# Patient Record
Sex: Female | Born: 1975
Health system: Southern US, Community
[De-identification: ages and names within clinical notes are randomized; demographics above are authoritative.]

## PROBLEM LIST (undated history)

## (undated) DIAGNOSIS — F329 Major depressive disorder, single episode, unspecified: Secondary | ICD-10-CM

## (undated) DIAGNOSIS — J4540 Moderate persistent asthma, uncomplicated: Secondary | ICD-10-CM

## (undated) DIAGNOSIS — E119 Type 2 diabetes mellitus without complications: Secondary | ICD-10-CM

## (undated) DIAGNOSIS — J449 Chronic obstructive pulmonary disease, unspecified: Secondary | ICD-10-CM

## (undated) DIAGNOSIS — R0789 Other chest pain: Secondary | ICD-10-CM

## (undated) DIAGNOSIS — N939 Abnormal uterine and vaginal bleeding, unspecified: Secondary | ICD-10-CM

## (undated) DIAGNOSIS — K2970 Gastritis, unspecified, without bleeding: Secondary | ICD-10-CM

## (undated) DIAGNOSIS — Z8639 Personal history of other endocrine, nutritional and metabolic disease: Secondary | ICD-10-CM

## (undated) DIAGNOSIS — D509 Iron deficiency anemia, unspecified: Secondary | ICD-10-CM

## (undated) DIAGNOSIS — M1990 Unspecified osteoarthritis, unspecified site: Secondary | ICD-10-CM

## (undated) DIAGNOSIS — E785 Hyperlipidemia, unspecified: Secondary | ICD-10-CM

## (undated) DIAGNOSIS — R1084 Generalized abdominal pain: Secondary | ICD-10-CM

## (undated) DIAGNOSIS — E1143 Type 2 diabetes mellitus with diabetic autonomic (poly)neuropathy: Secondary | ICD-10-CM

## (undated) DIAGNOSIS — E1142 Type 2 diabetes mellitus with diabetic polyneuropathy: Secondary | ICD-10-CM

## (undated) DIAGNOSIS — F17200 Nicotine dependence, unspecified, uncomplicated: Secondary | ICD-10-CM

## (undated) DIAGNOSIS — K219 Gastro-esophageal reflux disease without esophagitis: Secondary | ICD-10-CM

## (undated) DIAGNOSIS — N938 Other specified abnormal uterine and vaginal bleeding: Secondary | ICD-10-CM

## (undated) DIAGNOSIS — N183 Chronic kidney disease, stage 3 unspecified: Secondary | ICD-10-CM

## (undated) DIAGNOSIS — F419 Anxiety disorder, unspecified: Secondary | ICD-10-CM

## (undated) DIAGNOSIS — Z8719 Personal history of other diseases of the digestive system: Secondary | ICD-10-CM

## (undated) DIAGNOSIS — M797 Fibromyalgia: Secondary | ICD-10-CM

## (undated) DIAGNOSIS — I5032 Chronic diastolic (congestive) heart failure: Secondary | ICD-10-CM

## (undated) DIAGNOSIS — G894 Chronic pain syndrome: Secondary | ICD-10-CM

## (undated) DIAGNOSIS — F411 Generalized anxiety disorder: Secondary | ICD-10-CM

## (undated) DIAGNOSIS — G709 Myoneural disorder, unspecified: Secondary | ICD-10-CM

## (undated) DIAGNOSIS — I10 Essential (primary) hypertension: Secondary | ICD-10-CM

## (undated) DIAGNOSIS — D649 Anemia, unspecified: Secondary | ICD-10-CM

## (undated) DIAGNOSIS — R569 Unspecified convulsions: Secondary | ICD-10-CM

## (undated) DIAGNOSIS — Z973 Presence of spectacles and contact lenses: Secondary | ICD-10-CM

## (undated) DIAGNOSIS — D573 Sickle-cell trait: Secondary | ICD-10-CM

## (undated) DIAGNOSIS — K581 Irritable bowel syndrome with constipation: Secondary | ICD-10-CM

## (undated) DIAGNOSIS — J45909 Unspecified asthma, uncomplicated: Secondary | ICD-10-CM

## (undated) DIAGNOSIS — Z794 Long term (current) use of insulin: Secondary | ICD-10-CM

## (undated) DIAGNOSIS — K3184 Gastroparesis: Secondary | ICD-10-CM

## (undated) HISTORY — DX: Sickle-cell trait: D57.3

## (undated) HISTORY — PX: TUBAL LIGATION: SHX77

## (undated) HISTORY — DX: Gastritis, unspecified, without bleeding: K29.70

## (undated) HISTORY — PX: EYE SURGERY: SHX253

## (undated) HISTORY — PX: CATARACT EXTRACTION W/ INTRAOCULAR LENS IMPLANT: SHX1309

## (undated) HISTORY — DX: Type 2 diabetes mellitus without complications: Z79.4

---

## 2006-11-06 DIAGNOSIS — R232 Flushing: Secondary | ICD-10-CM | POA: Insufficient documentation

## 2006-11-06 DIAGNOSIS — E1143 Type 2 diabetes mellitus with diabetic autonomic (poly)neuropathy: Secondary | ICD-10-CM | POA: Insufficient documentation

## 2012-05-13 DIAGNOSIS — G8929 Other chronic pain: Secondary | ICD-10-CM | POA: Insufficient documentation

## 2012-05-13 DIAGNOSIS — G894 Chronic pain syndrome: Secondary | ICD-10-CM | POA: Insufficient documentation

## 2013-05-04 DIAGNOSIS — M1710 Unilateral primary osteoarthritis, unspecified knee: Secondary | ICD-10-CM | POA: Insufficient documentation

## 2013-05-04 DIAGNOSIS — R609 Edema, unspecified: Secondary | ICD-10-CM | POA: Insufficient documentation

## 2013-05-04 DIAGNOSIS — G5600 Carpal tunnel syndrome, unspecified upper limb: Secondary | ICD-10-CM | POA: Insufficient documentation

## 2015-03-15 ENCOUNTER — Encounter (HOSPITAL_COMMUNITY): Payer: Self-pay | Admitting: Nurse Practitioner

## 2015-03-15 ENCOUNTER — Emergency Department (HOSPITAL_COMMUNITY)
Admission: EM | Admit: 2015-03-15 | Discharge: 2015-03-15 | Disposition: A | Discharge status: Home / Self Care | Payer: Medicaid - Out of State | Attending: Emergency Medicine | Admitting: Emergency Medicine

## 2015-03-15 DIAGNOSIS — E119 Type 2 diabetes mellitus without complications: Secondary | ICD-10-CM | POA: Insufficient documentation

## 2015-03-15 DIAGNOSIS — I10 Essential (primary) hypertension: Secondary | ICD-10-CM | POA: Diagnosis not present

## 2015-03-15 DIAGNOSIS — Z760 Encounter for issue of repeat prescription: Secondary | ICD-10-CM | POA: Diagnosis present

## 2015-03-15 DIAGNOSIS — Z8739 Personal history of other diseases of the musculoskeletal system and connective tissue: Secondary | ICD-10-CM | POA: Diagnosis not present

## 2015-03-15 DIAGNOSIS — Z720 Tobacco use: Secondary | ICD-10-CM | POA: Insufficient documentation

## 2015-03-15 HISTORY — DX: Type 2 diabetes mellitus without complications: E11.9

## 2015-03-15 HISTORY — DX: Essential (primary) hypertension: I10

## 2015-03-15 HISTORY — DX: Unspecified osteoarthritis, unspecified site: M19.90

## 2015-03-15 MED ORDER — INSULIN GLARGINE 100 UNIT/ML ~~LOC~~ SOLN: 70.0000 [IU] | Freq: Two times a day (BID) | SUBCUTANEOUS | Status: DC

## 2015-03-15 MED ORDER — LISINOPRIL 20 MG PO TABS: 20.0000 mg | ORAL_TABLET | Freq: Every day | ORAL | Status: DC

## 2015-03-15 NOTE — ED Provider Notes (Signed)
CSN: [id number]     Arrival date & time 03/15/15  1235 History  This chart was scribed for non-physician practitioner, Trixie Dredge, PA-C, working with Mancel Bale, MD by Charline Bills, ED Scribe. This patient was seen in room TR10C/TR10C and the patient's care was started at 1:31 PM.   Chief Complaint  Patient presents with  . Medication Refill   The history is provided by the patient. No language interpreter was used.   HPI Comments: Jill Shaw is a 39 y.o. female, with a h/o DM, HTN, arthritis, who presents to the Emergency Department for a medication refill of Lantus, Lisinopril and Hydrocodone. Pt moved here 2 months ago from [place] and has not found a PCP that accepts her insurance yet. She ran out of Lantus and Lisinopril yesterday and Hydrocodone that she takes for arthritis 6-7 days ago. Pt's most recent blood glucose was 160. She reports intermittent bilateral foot pain that she attributes to arthritis. Pain is exacerbated in rainy weather, with walking and standing for long periods of time. Pt denies nausea, vomiting, diarrhea, chills, body aches, polydipsia, urinary frequency, dizziness, light-headedness.   Past Medical History  Diagnosis Date  . Arthritis   . Diabetes mellitus without complication   . Hypertension    No past surgical history on file. No family history on file. History  Substance Use Topics  . Smoking status: Current Every Day Smoker    Types: Cigarettes  . Smokeless tobacco: Not on file  . Alcohol Use: No   OB History    No data available     Review of Systems  Constitutional: Negative for fever and chills.  Gastrointestinal: Negative for nausea, vomiting and diarrhea.  Endocrine: Negative for polydipsia.  Genitourinary: Negative for frequency.  Musculoskeletal: Positive for arthralgias. Negative for myalgias.  Allergic/Immunologic: Positive for immunocompromised state (diabetes).  Neurological: Negative for dizziness and light-headedness.   Hematological: Does not bruise/bleed easily.   Allergies  Ibuprofen  Home Medications   Prior to Admission medications   Not on File   Triage: BP 146/78 mmHg  Pulse 94  Temp(Src) 98.2 F (36.8 C) (Oral)  Resp 15  SpO2 97% Physical Exam  Constitutional: She appears well-developed and well-nourished. No distress.  HENT:  Head: Normocephalic and atraumatic.  Eyes: Conjunctivae are normal.  Neck: Neck supple.  Pulmonary/Chest: Effort normal.  Neurological: She is alert. She exhibits normal muscle tone.  Skin: She is not diaphoretic.  Thickened nail of right great toe.    Psychiatric: She has a normal mood and affect. Her behavior is normal.  Nursing note and vitals reviewed.  ED Course  Procedures (including critical care time) DIAGNOSTIC STUDIES: Oxygen Saturation is 97% on RA, normal by my interpretation.    COORDINATION OF CARE: 1:36 PM-Discussed treatment plan which includes Lantus and Lisinopril with pt at bedside and pt agreed to plan.   Labs Review Labs Reviewed - No data to display  Imaging Review No results found.   EKG Interpretation None      MDM   Final diagnoses:  Medication refill    Afebrile, nontoxic patient with request for med refill with no change in chronic conditions and no new complaints.  She ran out of lisinopril, Lantus yesterday and has been out of her vicodin for 1 week.  No withdrawal symptoms.  No symptoms concerning for significant hyperglycemia.  Blood sugars have been well controlled at home.   D/C home with PCP resources, prescriptions for Lantus and lisinopril only.  Pt advised  she needed primary care to discussed chronic pain management.  Discussed result, findings, treatment, and follow up  with patient.  Pt given return precautions.  Pt verbalizes understanding and agrees with plan.       I personally performed the services described in this documentation, which was scribed in my presence. The recorded information has been  reviewed and is accurate.    Trixie Dredgemily Natalia Wittmeyer, PA-C 03/15/15 1400  Mancel BaleElliott Wentz, MD 03/15/15 [phone]

## 2015-03-15 NOTE — Discharge Instructions (Signed)
Read the information below.  Use the prescribed medication as directed.  Please discuss all new medications with your pharmacist.  You may return to the Emergency Department at any time for worsening condition or any new symptoms that concern you.  If there is any possibility that you might be pregnant, please let your health care provider know and discuss this with the pharmacist to ensure medication safety.  If you develop fevers, uncontrolled pain, uncontrolled vomiting, or difficulty breathing or swallowing return to the ER for a recheck.      Medication Refill, Emergency Department We have refilled your medication today as a courtesy to you. It is best for your medical care, however, to take care of getting refills done through your primary caregiver's office. They have your records and can do a better job of follow-up than we can in the emergency department. On maintenance medications, we often only prescribe enough medications to get you by until you are able to see your regular caregiver. This is a more expensive way to refill medications. In the future, please plan for refills so that you will not have to use the emergency department for this. Thank you for your help. Your help allows us to better take care of the daily emergencies that enter our department. Document Released: 01/19/2004 Document Revised: 12/25/2011 Document Reviewed: 01/09/2014 [facility] Patient Information 2015 [place], [place]. This information is not intended to replace advice given to you by your health care provider. Make sure you discuss any questions you have with your health care provider.

## 2015-03-15 NOTE — ED Notes (Signed)
She reports she has run out of her medications and needs a refill. She states now she is having bilateral foot pain from her arthritis because she doesn't have her pain meds

## 2015-07-08 ENCOUNTER — Ambulatory Visit: Payer: Medicaid - Out of State

## 2015-07-12 ENCOUNTER — Encounter: Payer: Self-pay | Admitting: Internal Medicine

## 2015-07-12 ENCOUNTER — Ambulatory Visit: Payer: Medicaid - Out of State | Admitting: Internal Medicine

## 2016-01-15 DIAGNOSIS — Z87898 Personal history of other specified conditions: Secondary | ICD-10-CM

## 2016-01-15 HISTORY — DX: Personal history of other specified conditions: Z87.898

## 2016-01-19 ENCOUNTER — Ambulatory Visit: Payer: Medicaid - Out of State | Attending: Internal Medicine

## 2016-01-21 ENCOUNTER — Encounter (HOSPITAL_COMMUNITY): Payer: Self-pay | Admitting: Emergency Medicine

## 2016-01-21 ENCOUNTER — Emergency Department (HOSPITAL_COMMUNITY): Payer: Self-pay

## 2016-01-21 ENCOUNTER — Emergency Department (HOSPITAL_COMMUNITY)
Admission: EM | Admit: 2016-01-21 | Discharge: 2016-01-21 | Disposition: A | Discharge status: Home / Self Care | Payer: Self-pay | Attending: Emergency Medicine | Admitting: Emergency Medicine

## 2016-01-21 DIAGNOSIS — E876 Hypokalemia: Secondary | ICD-10-CM | POA: Insufficient documentation

## 2016-01-21 DIAGNOSIS — F17210 Nicotine dependence, cigarettes, uncomplicated: Secondary | ICD-10-CM | POA: Insufficient documentation

## 2016-01-21 DIAGNOSIS — E11649 Type 2 diabetes mellitus with hypoglycemia without coma: Secondary | ICD-10-CM | POA: Insufficient documentation

## 2016-01-21 DIAGNOSIS — E162 Hypoglycemia, unspecified: Secondary | ICD-10-CM

## 2016-01-21 DIAGNOSIS — Z3202 Encounter for pregnancy test, result negative: Secondary | ICD-10-CM | POA: Insufficient documentation

## 2016-01-21 DIAGNOSIS — Z8739 Personal history of other diseases of the musculoskeletal system and connective tissue: Secondary | ICD-10-CM | POA: Insufficient documentation

## 2016-01-21 DIAGNOSIS — R569 Unspecified convulsions: Secondary | ICD-10-CM | POA: Insufficient documentation

## 2016-01-21 DIAGNOSIS — Z79899 Other long term (current) drug therapy: Secondary | ICD-10-CM | POA: Insufficient documentation

## 2016-01-21 DIAGNOSIS — Z794 Long term (current) use of insulin: Secondary | ICD-10-CM | POA: Insufficient documentation

## 2016-01-21 DIAGNOSIS — I10 Essential (primary) hypertension: Secondary | ICD-10-CM | POA: Insufficient documentation

## 2016-01-21 DIAGNOSIS — E669 Obesity, unspecified: Secondary | ICD-10-CM | POA: Insufficient documentation

## 2016-01-21 LAB — CBG MONITORING, ED
GLUCOSE-CAPILLARY: 50 mg/dL — AB (ref 65–99)
GLUCOSE-CAPILLARY: 96 mg/dL (ref 65–99)
GLUCOSE-CAPILLARY: 157 mg/dL — AB (ref 65–99)
Glucose-Capillary: 73 mg/dL (ref 65–99)

## 2016-01-21 LAB — RAPID URINE DRUG SCREEN, HOSP PERFORMED
Amphetamines: NOT DETECTED
BENZODIAZEPINES: NOT DETECTED
Barbiturates: NOT DETECTED
Cocaine: NOT DETECTED
Opiates: NOT DETECTED
Tetrahydrocannabinol: NOT DETECTED

## 2016-01-21 LAB — COMPREHENSIVE METABOLIC PANEL WITH GFR
ALT: 28 U/L (ref 14–54)
AST: 46 U/L — ABNORMAL HIGH (ref 15–41)
Albumin: 3.4 g/dL — ABNORMAL LOW (ref 3.5–5.0)
Alkaline Phosphatase: 59 U/L (ref 38–126)
Anion gap: 16 — ABNORMAL HIGH (ref 5–15)
BUN: 10 mg/dL (ref 6–20)
CO2: 19 mmol/L — ABNORMAL LOW (ref 22–32)
Calcium: 8.7 mg/dL — ABNORMAL LOW (ref 8.9–10.3)
Chloride: 106 mmol/L (ref 101–111)
Creatinine, Ser: 1.15 mg/dL — ABNORMAL HIGH (ref 0.44–1.00)
GFR calc Af Amer: >60 mL/min
GFR calc non Af Amer: 59 mL/min — ABNORMAL LOW
Glucose, Bld: 53 mg/dL — ABNORMAL LOW (ref 65–99)
Potassium: 3.3 mmol/L — ABNORMAL LOW (ref 3.5–5.1)
Sodium: 141 mmol/L (ref 135–145)
Total Bilirubin: 0.3 mg/dL (ref 0.3–1.2)
Total Protein: 6.1 g/dL — ABNORMAL LOW (ref 6.5–8.1)

## 2016-01-21 LAB — CBC
HCT: 30.8 % — ABNORMAL LOW (ref 36.0–46.0)
Hemoglobin: 9.5 g/dL — ABNORMAL LOW (ref 12.0–15.0)
MCH: 21.5 pg — ABNORMAL LOW (ref 26.0–34.0)
MCHC: 30.8 g/dL (ref 30.0–36.0)
MCV: 69.7 fL — ABNORMAL LOW (ref 78.0–100.0)
Platelets: 222 K/uL (ref 150–400)
RBC: 4.42 MIL/uL (ref 3.87–5.11)
RDW: 20.4 % — ABNORMAL HIGH (ref 11.5–15.5)
WBC: 8.5 K/uL (ref 4.0–10.5)

## 2016-01-21 LAB — URINALYSIS, ROUTINE W REFLEX MICROSCOPIC
Bilirubin Urine: NEGATIVE
GLUCOSE, UA: 100 mg/dL — AB
Ketones, ur: NEGATIVE mg/dL
Nitrite: NEGATIVE
PROTEIN: NEGATIVE mg/dL
Specific Gravity, Urine: 1.008 (ref 1.005–1.030)
pH: 6 (ref 5.0–8.0)

## 2016-01-21 LAB — URINE MICROSCOPIC-ADD ON: Bacteria, UA: NONE SEEN

## 2016-01-21 LAB — POC URINE PREG, ED: Preg Test, Ur: NEGATIVE

## 2016-01-21 MED ORDER — ACETAMINOPHEN 500 MG PO TABS
1000.0000 mg | ORAL_TABLET | Freq: Once | ORAL | Status: AC
  Administered 2016-01-21: 1000 mg via ORAL
  Filled 2016-01-21: qty 2

## 2016-01-21 MED ORDER — LEVETIRACETAM 500 MG PO TABS: 500.0000 mg | ORAL_TABLET | Freq: Two times a day (BID) | ORAL | Status: DC

## 2016-01-21 MED ORDER — IBUPROFEN 800 MG PO TABS: 800.0000 mg | ORAL_TABLET | Freq: Once | ORAL | Status: DC

## 2016-01-21 MED ORDER — DEXTROSE 50 % IV SOLN
INTRAVENOUS | Status: AC
  Administered 2016-01-21: 03:00:00
  Filled 2016-01-21: qty 50

## 2016-01-21 MED ORDER — SODIUM CHLORIDE 0.9 % IV SOLN
500.0000 mg | Freq: Once | INTRAVENOUS | Status: AC
  Administered 2016-01-21: 500 mg via INTRAVENOUS
  Filled 2016-01-21: qty 5

## 2016-01-21 MED ORDER — ONDANSETRON 4 MG PO TBDP: 4.0000 mg | ORAL_TABLET | Freq: Once | ORAL | Status: DC

## 2016-01-21 MED ORDER — SODIUM CHLORIDE 0.9 % IV BOLUS (SEPSIS)
1000.0000 mL | Freq: Once | INTRAVENOUS | Status: AC
  Administered 2016-01-21: 1000 mL via INTRAVENOUS

## 2016-01-21 NOTE — ED Provider Notes (Signed)
By signing my name below, I, Jill Shaw, attest that this documentation has been prepared under the direction and in the presence of [employer], DO. Electronically Signed: Budd Shaw, ED Scribe. 01/21/2016. 3:12 AM.  TIME SEEN: 3:00 AM  CHIEF COMPLAINT: Seizure  HPI: Jill Shaw is a 40 y.o. female smoker with a PMHx of DM, HTN, and seizures brought in by ambulance who presents to the Emergency Department complaining of seizure that occurred prior to arrival.  Per mom, pt woke up yelling in bed. She states pt was having seizure-like activity with full-body shakes, but notes this did not last very long. She reports pt being confused immediately afterwards, with heavy breathing and sticking out her tongue. Pt notes a PMHx of seizures with hypoglycemia in the past. She notes she is on insulin. Per mom, pt has not been seen by a neurologist for the seizures. She notes that pt has been working night shifts and that her seizures have been occuring for the past few years, typically when pt was stressed and has not slept enough. She states that pt's blood sugar was 91 at home, which is "low for her" per mother.  Mother does report that she will have seizures sometimes in the setting of a normal glucose. Pt denies any other pains, fever, n/v/d.   ROS: See HPI Constitutional: no fever  Eyes: no drainage  ENT: no runny nose   Cardiovascular:  no chest pain  Resp: no SOB  GI: no vomiting GU: no dysuria Integumentary: no rash  Allergy: no hives  Musculoskeletal: no leg swelling  Neurological: no slurred speech ROS otherwise negative  PAST MEDICAL HISTORY/PAST SURGICAL HISTORY:  Past Medical History  Diagnosis Date  . Arthritis   . Diabetes mellitus without complication (HCC)   . Hypertension     MEDICATIONS:  Prior to Admission medications   Medication Sig Start Date End Date Taking? Authorizing Provider  insulin glargine (LANTUS) 100 UNIT/ML injection Inject 0.7 mLs (70 Units  total) into the skin 2 (two) times daily. As previously directed 03/15/15   Trixie Dredge, PA-C  lisinopril (PRINIVIL,ZESTRIL) 20 MG tablet Take 1 tablet (20 mg total) by mouth daily. 03/15/15   Trixie Dredge, PA-C    ALLERGIES:  Allergies  Allergen Reactions  . Ibuprofen     SOCIAL HISTORY:  Social History  Substance Use Topics  . Smoking status: Current Every Day Smoker -- 0.50 packs/day    Types: Cigarettes  . Smokeless tobacco: Not on file  . Alcohol Use: No    FAMILY HISTORY: History reviewed. No pertinent family history.  EXAM: BP 131/79 mmHg  Pulse 100  Temp(Src) 99.1 F (37.3 C) (Oral)  Resp 20  SpO2 98%  LMP 01/06/2016 CONSTITUTIONAL: Alert and oriented x 3 and responds appropriately to questions. Well-appearing; well-nourished; obese HEAD: Normocephalic EYES: Conjunctivae clear, PERRL ENT: normal nose; no rhinorrhea; moist mucous membranes NECK: Supple, no meningismus, no LAD  CARD: RRR; S1 and S2 appreciated; no murmurs, no clicks, no rubs, no gallops RESP: Normal chest excursion without splinting or tachypnea; breath sounds clear and equal bilaterally; no wheezes, no rhonchi, no rales, no hypoxia or respiratory distress, speaking full sentences ABD/GI: Normal bowel sounds; non-distended; soft, non-tender, no rebound, no guarding, no peritoneal signs BACK:  The back appears normal and is non-tender to palpation, there is no CVA tenderness EXT: Normal ROM in all joints; non-tender to palpation; no edema; normal capillary refill; no cyanosis, no calf tenderness or swelling    SKIN:  Normal color for age and race; warm; no rash NEURO: Moves all extremities equally, sensation to light touch intact diffusely, cranial nerves II through XII intact PSYCH: The patient's mood and manner are appropriate. Grooming and personal hygiene are appropriate.  MEDICAL DECISION MAKING: Patient here with seizures. Family reports she does have seizures in the setting of hypoglycemia but also  without hypoglycemia. Glucose was EMS was 91 but is now 50. We have given her D50. No injury from the seizure. Reports mild headache which is typical for her. Neurologically intact currently. No longer postictal. We'll obtain labs, urine. She does report intermittent cough. Will obtain a chest x-ray. We'll discuss with neurology on-call. Have advised her and her mother that she should not be driving until she has been cleared by a neurologist.  ED PROGRESS: Patient's labs unremarkable other than mildly low potassium of 3.3, bicarbonate 19 and gap of 16. Suspect that she has an elevated lactate from seizure which may be causing her mild elevated anion gap metabolic acidosis. We'll give IV fluids. She is drinking fluids. Urine shows leukocytes but also blood. She is on her menstrual cycle and not having urinary symptoms. Denies excessive negative. I have discussed patient's care with Dr. Cherylynn RidgesShikhman with neurology. He agrees patient needs to be on antiepileptic and follow-up as outpatient for an MRI of her brain, EEG. Will start on Keppra 500 mg twice a day. Given first dose IV in the emergency department. We'll discharge with prescription for the same and coupon to obtain this prescription from all her pharmacy. She reports that she is supposed to obtain an orange card next week. Have provided her with neurology and PCP follow-up. Have advised her to watch her blood glucose closely at home. She is only on Humalog. Recommended close outpatient follow-up with her PCP to adjust her insulin regimen. She is no longer on her Lantus because she cannot afford it at this time.   At this time, I do not feel there is any life-threatening condition present. I have reviewed and discussed all results (EKG, imaging, lab, urine as appropriate), exam findings with patient. I have reviewed nursing notes and appropriate previous records.  I feel the patient is safe to be discharged home without further emergent workup. Discussed  usual and customary return precautions. Patient and family (if present) verbalize understanding and are comfortable with this plan.  Patient will follow-up with their primary care provider. If they do not have a primary care provider, information for follow-up has been provided to them. All questions have been answered.   I personally performed the services described in this documentation, which was scribed in my presence. The recorded information has been reviewed and is accurate.   Jill MawKristen N Annell Canty, DO 01/21/16 0830

## 2016-01-21 NOTE — Discharge Instructions (Signed)
You should not drive a car, operate machinery or perform any other activity that need be dangerous to you've your to have another seizure until you have been cleared by a neurologist. Please call neurology to schedule an outpatient appointment and she will need an MRI of your brain and an EEG to measure electrical activity of your brain. Please take Keppra twice a day as prescribed. Please take your insulin as prescribed. Please follow up with her primary care physician.  Hypoglycemia Hypoglycemia occurs when the glucose in your blood is too low. Glucose is a type of sugar that is your body's main energy source. Hormones, such as insulin and glucagon, control the level of glucose in the blood. Insulin lowers blood glucose and glucagon increases blood glucose. Having too much insulin in your blood stream, or not eating enough food containing sugar, can result in hypoglycemia. Hypoglycemia can happen to people with or without diabetes. It can develop quickly and can be a medical emergency.  CAUSES   Missing or delaying meals.  Not eating enough carbohydrates at meals.  Taking too much diabetes medicine.  Not timing your oral diabetes medicine or insulin doses with meals, snacks, and exercise.  Nausea and vomiting.  Certain medicines.  Severe illnesses, such as hepatitis, kidney disorders, and certain eating disorders.  Increased activity or exercise without eating something extra or adjusting medicines.  Drinking too much alcohol.  A nerve disorder that affects body functions like your heart rate, blood pressure, and digestion (autonomic neuropathy).  A condition where the stomach muscles do not function properly (gastroparesis). Therefore, medicines and food may not absorb properly.  Rarely, a tumor of the pancreas can produce too much insulin. SYMPTOMS   Hunger.  Sweating (diaphoresis).  Change in body  temperature.  Shakiness.  Headache.  Anxiety.  Lightheadedness.  Irritability.  Difficulty concentrating.  Dry mouth.  Tingling or numbness in the hands or feet.  Restless sleep or sleep disturbances.  Altered speech and coordination.  Change in mental status.  Seizures or prolonged convulsions.  Combativeness.  Drowsiness (lethargic).  Weakness.  Increased heart rate or palpitations.  Confusion.  Pale, gray skin color.  Blurred or double vision.  Fainting. DIAGNOSIS  A physical exam and medical history will be performed. Your caregiver may make a diagnosis based on your symptoms. Blood tests and other lab tests may be performed to confirm a diagnosis. Once the diagnosis is made, your caregiver will see if your signs and symptoms go away once your blood glucose is raised.  TREATMENT  Usually, you can easily treat your hypoglycemia when you notice symptoms.  Check your blood glucose. If it is less than 70 mg/dl, take one of the following:   3-4 glucose tablets.    cup juice.    cup regular soda.   1 cup skim milk.   -1 tube of glucose gel.   5-6 hard candies.   Avoid high-fat drinks or food that may delay a rise in blood glucose levels.  Do not take more than the recommended amount of sugary foods, drinks, gel, or tablets. Doing so will cause your blood glucose to go too high.   Wait 10-15 minutes and recheck your blood glucose. If it is still less than 70 mg/dl or below your target range, repeat treatment.   Eat a snack if it is more than 1 hour until your next meal.  There may be a time when your blood glucose may go so low that you are unable to  treat yourself at home when you start to notice symptoms. You may need someone to help you. You may even faint or be unable to swallow. If you cannot treat yourself, someone will need to bring you to the hospital.  HOME CARE INSTRUCTIONS  If you have diabetes, follow your diabetes management  plan by:  Taking your medicines as directed.  Following your exercise plan.  Following your meal plan. Do not skip meals. Eat on time.  Testing your blood glucose regularly. Check your blood glucose before and after exercise. If you exercise longer or different than usual, be sure to check blood glucose more frequently.  Wearing your medical alert jewelry that says you have diabetes.  Identify the cause of your hypoglycemia. Then, develop ways to prevent the recurrence of hypoglycemia.  Do not take a hot bath or shower right after an insulin shot.  Always carry treatment with you. Glucose tablets are the easiest to carry.  If you are going to drink alcohol, drink it only with meals.  Tell friends or family members ways to keep you safe during a seizure. This may include removing hard or sharp objects from the area or turning you on your side.  Maintain a healthy weight. SEEK MEDICAL CARE IF:   You are having problems keeping your blood glucose in your target range.  You are having frequent episodes of hypoglycemia.  You feel you might be having side effects from your medicines.  You are not sure why your blood glucose is dropping so low.  You notice a change in vision or a new problem with your vision. SEEK IMMEDIATE MEDICAL CARE IF:   Confusion develops.  A change in mental status occurs.  The inability to swallow develops.  Fainting occurs.   This information is not intended to replace advice given to you by your health care provider. Make sure you discuss any questions you have with your health care provider.   Document Released: 10/02/2005 Document Revised: 10/07/2013 Document Reviewed: 06/08/2015 Elsevier Interactive Patient Education [employer]2016 [employer].  Seizure, Adult A seizure is abnormal electrical activity in the brain. Seizures usually last from 30 seconds to 2 minutes. There are various types of seizures. Before a seizure, you may have a warning  sensation (aura) that a seizure is about to occur. An aura may include the following symptoms:   Fear or anxiety.  Nausea.  Feeling like the room is spinning (vertigo).  Vision changes, such as seeing flashing lights or spots. Common symptoms during a seizure include:  A change in attention or behavior (altered mental status).  Convulsions with rhythmic jerking movements.  Drooling.  Rapid eye movements.  Grunting.  Loss of bladder and bowel control.  Bitter taste in the mouth.  Tongue biting. After a seizure, you may feel confused and sleepy. You may also have an injury resulting from convulsions during the seizure. HOME CARE INSTRUCTIONS   If you are given medicines, take them exactly as prescribed by your health care provider.  Keep all follow-up appointments as directed by your health care provider.  Do not swim or drive or engage in risky activity during which a seizure could cause further injury to you or others until your health care provider says it is OK.  Get adequate rest.  Teach friends and family what to do if you have a seizure. They should:  Lay you on the ground to prevent a fall.  Put a cushion under your head.  Loosen any tight clothing  around your neck.  Turn you on your side. If vomiting occurs, this helps keep your airway clear.  Stay with you until you recover.  Know whether or not you need emergency care. SEEK IMMEDIATE MEDICAL CARE IF:  The seizure lasts longer than 5 minutes.  The seizure is severe or you do not wake up immediately after the seizure.  You have an altered mental status after the seizure.  You are having more frequent or worsening seizures. Someone should drive you to the emergency department or call local emergency services (911 in [place] MAKE SURE YOU:  Understand these instructions.  Will watch your condition.  Will get help right away if you are not doing well or get worse.   This information is not  intended to replace advice given to you by your health care provider. Make sure you discuss any questions you have with your health care provider.   Document Released: 09/29/2000 Document Revised: 10/23/2014 Document Reviewed: 05/14/2013 Elsevier Interactive Patient Education [employer]   To find a primary care or specialty doctor please call [phone] or [phone] to access "[facility] Find a Doctor Service."  You may also go on the [facility] website at [email or web address]  There are also multiple [facility] practices throughout the [place] that are frequently accepting new patients. You may find a clinic that is close to your home and contact them.  [facility] -  [address] [place] [address] [phone]  [facility] in [place] (also locations in [place] and [place]) -  [address] [place] [address] [phone]  [facility] -  [address] [place] [address] [phone]

## 2016-01-21 NOTE — ED Notes (Signed)
Per EMS:  PT's family called 911 after patient woke up from sleep and was "yelling".  Pt was altered when family arrived to room, patient able to state she needed to pee and go to bathroom independently.  Pt lethargic during transport.  PAtient more alert once arriving to room.  Pt's family states pt has seizures when she is hypoglycemic, EMS states CBG= 91.  Patient respirations deep and intentional.

## 2016-01-21 NOTE — ED Notes (Signed)
Pt requesting to sit on the edge of the bed. This RN explained that it would be better to be fully in the bed due to being a high fall risk. Pt insistent on sitting on the edge of the bed, family at the bedside offering to sit in front of the patient. Pt sitting on the edge of the bed at this time. Denies feeling lightheaded or dizzy.

## 2016-01-21 NOTE — ED Notes (Signed)
MD at bedside. 

## 2016-01-21 NOTE — ED Notes (Signed)
CBG 50, Dr Elesa MassedWard notified and gave verbal order for amp of d50

## 2016-01-22 LAB — URINE CULTURE

## 2016-02-02 ENCOUNTER — Ambulatory Visit: Payer: Medicaid - Out of State | Admitting: Family Medicine

## 2016-02-08 ENCOUNTER — Emergency Department (HOSPITAL_COMMUNITY)
Admission: EM | Admit: 2016-02-08 | Discharge: 2016-02-08 | Disposition: A | Discharge status: Home / Self Care | Payer: Medicaid - Out of State | Attending: Emergency Medicine | Admitting: Emergency Medicine

## 2016-02-08 ENCOUNTER — Encounter (HOSPITAL_COMMUNITY): Payer: Self-pay | Admitting: Emergency Medicine

## 2016-02-08 DIAGNOSIS — Z8739 Personal history of other diseases of the musculoskeletal system and connective tissue: Secondary | ICD-10-CM | POA: Insufficient documentation

## 2016-02-08 DIAGNOSIS — Z862 Personal history of diseases of the blood and blood-forming organs and certain disorders involving the immune mechanism: Secondary | ICD-10-CM | POA: Insufficient documentation

## 2016-02-08 DIAGNOSIS — Z79899 Other long term (current) drug therapy: Secondary | ICD-10-CM | POA: Insufficient documentation

## 2016-02-08 DIAGNOSIS — Z3202 Encounter for pregnancy test, result negative: Secondary | ICD-10-CM | POA: Insufficient documentation

## 2016-02-08 DIAGNOSIS — R739 Hyperglycemia, unspecified: Secondary | ICD-10-CM

## 2016-02-08 DIAGNOSIS — F17210 Nicotine dependence, cigarettes, uncomplicated: Secondary | ICD-10-CM | POA: Insufficient documentation

## 2016-02-08 DIAGNOSIS — I10 Essential (primary) hypertension: Secondary | ICD-10-CM | POA: Insufficient documentation

## 2016-02-08 DIAGNOSIS — Z794 Long term (current) use of insulin: Secondary | ICD-10-CM | POA: Insufficient documentation

## 2016-02-08 DIAGNOSIS — E1165 Type 2 diabetes mellitus with hyperglycemia: Secondary | ICD-10-CM | POA: Insufficient documentation

## 2016-02-08 LAB — CBC WITH DIFFERENTIAL/PLATELET
BASOS ABS: 0 10*3/uL (ref 0.0–0.1)
BLASTS: 0 %
Band Neutrophils: 0 %
Basophils Relative: 0 %
EOS ABS: 0.1 10*3/uL (ref 0.0–0.7)
Eosinophils Relative: 1 %
HEMATOCRIT: 32 % — AB (ref 36.0–46.0)
HEMOGLOBIN: 9.7 g/dL — AB (ref 12.0–15.0)
LYMPHS PCT: 21 %
Lymphs Abs: 2.1 10*3/uL (ref 0.7–4.0)
MCH: 21 pg — ABNORMAL LOW (ref 26.0–34.0)
MCHC: 30.3 g/dL (ref 30.0–36.0)
MCV: 69.4 fL — AB (ref 78.0–100.0)
METAMYELOCYTES PCT: 0 %
MONOS PCT: 5 %
Monocytes Absolute: 0.5 10*3/uL (ref 0.1–1.0)
Myelocytes: 0 %
NEUTROS ABS: 7.1 10*3/uL (ref 1.7–7.7)
Neutrophils Relative %: 73 %
Other: 0 %
PROMYELOCYTES ABS: 0 %
Platelets: 182 10*3/uL (ref 150–400)
RBC: 4.61 MIL/uL (ref 3.87–5.11)
RDW: 20.2 % — AB (ref 11.5–15.5)
WBC: 9.8 10*3/uL (ref 4.0–10.5)
nRBC: 0 /100 WBC

## 2016-02-08 LAB — I-STAT VENOUS BLOOD GAS, ED
Bicarbonate: 23.7 mEq/L (ref 20.0–24.0)
O2 Saturation: 80 %
TCO2: 25 mmol/L (ref 0–100)
pCO2, Ven: 36.3 mmHg — ABNORMAL LOW (ref 45.0–50.0)
pH, Ven: 7.423 — ABNORMAL HIGH (ref 7.250–7.300)
pO2, Ven: 43 mmHg (ref 31.0–45.0)

## 2016-02-08 LAB — CBG MONITORING, ED
GLUCOSE-CAPILLARY: 302 mg/dL — AB (ref 65–99)
Glucose-Capillary: 505 mg/dL — ABNORMAL HIGH (ref 65–99)
Glucose-Capillary: 394 mg/dL — ABNORMAL HIGH (ref 65–99)
Glucose-Capillary: 380 mg/dL — ABNORMAL HIGH (ref 65–99)

## 2016-02-08 LAB — POC URINE PREG, ED: PREG TEST UR: NEGATIVE

## 2016-02-08 LAB — URINALYSIS, ROUTINE W REFLEX MICROSCOPIC
BILIRUBIN URINE: NEGATIVE
Glucose, UA: >1000 mg/dL — AB
HGB URINE DIPSTICK: NEGATIVE
KETONES UR: 15 mg/dL — AB
LEUKOCYTES UA: NEGATIVE
NITRITE: NEGATIVE
PROTEIN: NEGATIVE mg/dL
Specific Gravity, Urine: 1.029 (ref 1.005–1.030)
pH: 6 (ref 5.0–8.0)

## 2016-02-08 LAB — URINE MICROSCOPIC-ADD ON: RBC / HPF: NONE SEEN RBC/hpf (ref 0–5)

## 2016-02-08 LAB — BASIC METABOLIC PANEL
Anion gap: 10 (ref 5–15)
BUN: 12 mg/dL (ref 6–20)
CALCIUM: 9 mg/dL (ref 8.9–10.3)
CO2: 22 mmol/L (ref 22–32)
Chloride: 102 mmol/L (ref 101–111)
Creatinine, Ser: 1.18 mg/dL — ABNORMAL HIGH (ref 0.44–1.00)
GFR calc Af Amer: >60 mL/min (ref >60)
GFR, EST NON AFRICAN AMERICAN: 57 mL/min — AB (ref >60)
GLUCOSE: 550 mg/dL — AB (ref 65–99)
Potassium: 3.9 mmol/L (ref 3.5–5.1)
SODIUM: 134 mmol/L — AB (ref 135–145)

## 2016-02-08 MED ORDER — INSULIN LISPRO PROT & LISPRO (75-25 MIX) 100 UNIT/ML KWIKPEN: 50.0000 [IU] | PEN_INJECTOR | Freq: Two times a day (BID) | SUBCUTANEOUS | Status: DC

## 2016-02-08 MED ORDER — SODIUM CHLORIDE 0.9 % IV BOLUS (SEPSIS)
1000.0000 mL | Freq: Once | INTRAVENOUS | Status: AC
  Administered 2016-02-08: 1000 mL via INTRAVENOUS

## 2016-02-08 MED ORDER — INSULIN ASPART PROT & ASPART (70-30 MIX) 100 UNIT/ML ~~LOC~~ SUSP
70.0000 [IU] | Freq: Once | SUBCUTANEOUS | Status: AC
  Administered 2016-02-08: 70 [IU] via SUBCUTANEOUS
  Filled 2016-02-08: qty 10

## 2016-02-08 MED ORDER — SODIUM CHLORIDE 0.9 % IV SOLN
INTRAVENOUS | Status: DC
  Administered 2016-02-08: 12:00:00 via INTRAVENOUS

## 2016-02-08 MED FILL — HUMALOG MIX 75-25 KWIKPEN: (75-25) 100 | 15 days supply | Qty: 15 | Fill #0

## 2016-02-08 NOTE — Discharge Instructions (Signed)
You have been seen today for high blood sugar. Your blood sugar responded well to the IV fluids and insulin given today. It is very important that you get prescriptions for your insulin filled. You have been given resources from the care managers to help you achieve this. You have an appointment set up with the [facility] facility for May 8 at 11 AM. Be sure to keep this appointment. Return to ED should symptoms worsen.  RESOURCE GUIDE  Chronic Pain Problems: Contact [facility]  [phone] Patients need to be referred by their primary care doctor.  Insufficient Money for Medicine: Contact United Way:  call "211" or [facility] [phone].  No Primary Care Doctor: - Call Health Connect  [phone] - can help you locate a primary care doctor that  accepts your insurance, provides certain services, etc. - Physician Referral Service- [phone]  Agencies that provide inexpensive medical care: - [facility]  [phone] - [facility]  [phone] - [facility]  [phone] - Women's Clinic  [phone] - Planned Parenthood  [phone] [facility]  [phone]  Medicaid-accepting [facility] Providers: - [facility]- [address]  [phone], Mon-Fri 9am-7pm, Sat 9am-1pm - [facility]- [address] [place], Suite [place]  [phone] - [facility]- [address], Suite [place]  [phone] [facility]- [address]  [phone] - Renaye Rakers- [address] [place], [address], [phone]  Only accepts [place] patients after they have their name  applied to their card  Self Pay (no insurance) in [place]: - Sickle Cell Patients: Dr Willey Blade, [facility]  [address] [place], [phone] - [facility]- [address] [place]  [phone]       [facility] [place]- [address]       -     [facility]- see information above (Speak to [employer] if you do not have insurance)       -  [facility]- [address] [place], [phone]       -  [facility]- [address],  [phone]       -  [facility]- [address] [phone]       -  Dr Julio Sicks-  [address], [place], [phone]       -  [facility]- [address] [phone]       -  [facility]- [address] [phone], also [address], [phone]       -    [facility]- [address] [place], [phone], 1st & 3rd Saturday   every month, 10am-1pm  1) Find a Doctor and Pay Out of Pocket Although you won't have to find out who is covered by your insurance plan, it is a good idea to ask around and get recommendations. You will then need to call the office and see if the doctor you have chosen will accept you as a new patient and what types of options they offer for patients who are self-pay. Some doctors offer discounts or will set up payment plans for their patients who do not have insurance, but you will need to  ask so you aren't surprised when you get to your appointment.  2) Contact Your Local Health Department Not all health departments have doctors that can see patients for sick visits, but many do, so it is worth a call to see if yours does. If you don't know where your local health department is, you can check in your phone book. The CDC also has a tool to help you locate your state's health department, and many state websites also have listings of all of their local health departments.  3) Find a Walk-in Clinic If your illness is not likely to be very severe or complicated, you may want to try a walk in clinic. These are popping up all over the country in pharmacies, drugstores, and shopping centers. They're usually staffed by nurse practitioners or  physician assistants that have been trained to treat common illnesses and complaints. They're usually fairly quick and inexpensive. However, if you have serious medical issues or chronic medical problems, these are probably not your best option  STD Testing - [facility] Department of [facility], STD Clinic, [address] [place], phone [phone] or [phone].  Monday - Friday, call for an appointment. [facility] Department of [employer], STD Clinic, [address], [place], phone [phone] or [phone].  Monday - Friday, call for an appointment.  Abuse/Neglect: [facility] Child Abuse Hotline 9496864370 [facility] Child Abuse Hotline 9046501385 (After Hours)  Emergency Shelter:  [facility] [phone]  Maternity Homes: - [facility] [phone] - [facility] [phone]  MRSA Hotline #:   7430444069  [facility] Resources  [facility] [place]  United Way [facility] Dept. [address]                 [address]         [address] [place] [address]  Blondell Reveal Phone:  [phone]                                  Phone:  [phone]                   Phone:  [phone]  [facility], [phone] - [phone]       -     [facility] in [place], [address],                                  [phone], [facility] Child Abuse Hotline 719-793-7766 or 534-360-0658 (After Hours)   Behavioral Health Services  Substance Abuse Resources: - Alcohol and Drug Services  [phone] - [facility] [phone] - [facility] [phone] Floydene Flock [phone] - Residential & Outpatient  Substance Abuse  Program  [phone]  Psychological Services: Tressie Ellis[facility]  [phone] - [facility]  [phone] - [facility], [phone] [place] [address], [place], ACCESS LINE: [phone]) [phone] or [phone], [email or web address]  Dental Assistance  If unable to pay or uninsured, contact:  [facility] or [facility] to become qualified for the adult dental clinic.  Patients with Medicaid: [facility] [phone], [phone] [address], [phone]  If unable to pay, or uninsured, contact [facility] [phone]) or [facility] [phone] in [place], [phone] in [facility]) to become qualified for the adult dental clinic   Other Low-Cost Community Dental Services: - [facility]- [address], [place], [place], [phone], [phone], [phone], 2nd and 4th Thursday of the month at 6:30am.  10 clients each day by appointment, can sometimes see walk-in patients if someone does not show for an appointment. [facility]- [facility]- [address], [place], [place], [phone], [phone] - [facility]- [address], [place], [place], [address], [phone] - [facility]- [phone] [facility]- [facility]- [phone] [facility]- [facility]- [phone]

## 2016-02-08 NOTE — ED Provider Notes (Signed)
CSN: [id number]     Arrival date & time 02/08/16  [id number] History   First MD Initiated Contact with Patient 02/08/16 [phone]     Chief Complaint  Patient presents with  . Hyperglycemia     (Consider location/radiation/quality/duration/timing/severity/associated sxs/prior Treatment) HPI   Jill Shaw is a 40 y.o. female, with a history of DM and HTN, presenting to the ED with hyperglycemia. Patient states she has had about 4 days of polyuria and polydipsia. Patient states that she ran out of her insulin on April 21. Patient states that she has been having difficulty getting her medications since she recently moved and has had trouble getting her Medicaid card. Patient only occasionally tests her blood sugar and has not tested it in the last week. Patient denies fever/chills, nausea/vomiting, abdominal pain, shortness of breath, confusion, or any other complaints.    Past Medical History  Diagnosis Date  . Arthritis   . Diabetes mellitus without complication (HCC)   . Hypertension   . Sickle cell anemia (HCC)    History reviewed. No pertinent past surgical history. History reviewed. No pertinent family history. Social History  Substance Use Topics  . Smoking status: Current Every Day Smoker -- 0.50 packs/day    Types: Cigarettes  . Smokeless tobacco: None  . Alcohol Use: No   OB History    No data available     Review of Systems  Constitutional: Negative for fever and chills.  Respiratory: Negative for shortness of breath.   Gastrointestinal: Negative for nausea, vomiting and abdominal pain.  Endocrine: Positive for polydipsia and polyuria.  Genitourinary: Negative for dysuria and flank pain.  Skin: Negative for color change and pallor.  All other systems reviewed and are negative.     Allergies  Ibuprofen  Home Medications   Prior to Admission medications   Medication Sig Start Date End Date Taking? Authorizing Provider  insulin lispro protamine-lispro (HUMALOG 75/25  MIX) (75-25) 100 UNIT/ML SUSP injection Inject 70 Units into the skin 2 (two) times daily with a meal.    Yes Historical Provider, MD  levETIRAcetam (KEPPRA) 500 MG tablet Take 1 tablet (500 mg total) by mouth 2 (two) times daily. 01/21/16  Yes Kristen N Ward, DO  omeprazole (PRILOSEC) 40 MG capsule Take 40 mg by mouth daily.   Yes Historical Provider, MD  hydrochlorothiazide (HYDRODIURIL) 25 MG tablet Take 25 mg by mouth daily.    Historical Provider, MD  Insulin Lispro Prot & Lispro (HUMALOG 75/25 MIX) (75-25) 100 UNIT/ML Kwikpen Inject 50 Units into the skin 2 (two) times daily. 02/08/16   Payge Eppes C Watson Robarge, PA-C  lisinopril (PRINIVIL,ZESTRIL) 20 MG tablet Take 1 tablet (20 mg total) by mouth daily. 03/15/15   Trixie DredgeEmily West, PA-C   BP 119/69 mmHg  Pulse 81  Temp(Src) 98.7 F (37.1 C) (Oral)  Resp 18  Ht 5\' 4"  (1.626 m)  Wt 102.059 kg  BMI 38.60 kg/m2  SpO2 100%  LMP 12/28/2015 Physical Exam  Constitutional: She appears well-developed and well-nourished. No distress.  HENT:  Head: Normocephalic and atraumatic.  Eyes: Conjunctivae are normal. Pupils are equal, round, and reactive to light.  Neck: Neck supple.  Cardiovascular: Normal rate, regular rhythm and intact distal pulses.   Pulmonary/Chest: Effort normal. No respiratory distress.  Abdominal: Soft. There is no tenderness. There is no guarding.  Musculoskeletal: She exhibits no edema or tenderness.  Lymphadenopathy:    She has no cervical adenopathy.  Neurological: She is alert.  Skin: Skin is warm and dry.  She is not diaphoretic.  Psychiatric: She has a normal mood and affect. Her behavior is normal.  Nursing note and vitals reviewed.   ED Course  Procedures (including critical care time) Labs Review Labs Reviewed  BASIC METABOLIC PANEL - Abnormal; Notable for the following:    Sodium 134 (*)    Glucose, Bld 550 (*)    Creatinine, Ser 1.18 (*)    GFR calc non Af Amer 57 (*)    All other components within normal limits  CBC WITH  DIFFERENTIAL/PLATELET - Abnormal; Notable for the following:    Hemoglobin 9.7 (*)    HCT 32.0 (*)    MCV 69.4 (*)    MCH 21.0 (*)    RDW 20.2 (*)    All other components within normal limits  URINALYSIS, ROUTINE W REFLEX MICROSCOPIC (NOT AT [facility]) - Abnormal; Notable for the following:    APPearance CLOUDY (*)    Glucose, UA >1000 (*)    Ketones, ur 15 (*)    All other components within normal limits  URINE MICROSCOPIC-ADD ON - Abnormal; Notable for the following:    Squamous Epithelial / LPF 6-30 (*)    Bacteria, UA FEW (*)    All other components within normal limits  CBG MONITORING, ED - Abnormal; Notable for the following:    Glucose-Capillary 505 (*)    All other components within normal limits  I-STAT VENOUS BLOOD GAS, ED - Abnormal; Notable for the following:    pH, Ven 7.423 (*)    pCO2, Ven 36.3 (*)    All other components within normal limits  CBG MONITORING, ED - Abnormal; Notable for the following:    Glucose-Capillary 394 (*)    All other components within normal limits  CBG MONITORING, ED - Abnormal; Notable for the following:    Glucose-Capillary 380 (*)    All other components within normal limits  CBG MONITORING, ED - Abnormal; Notable for the following:    Glucose-Capillary 302 (*)    All other components within normal limits  URINE CULTURE  BLOOD GAS, VENOUS  POC URINE PREG, ED    Imaging Review No results found. I have personally reviewed and evaluated these lab results as part of my medical decision-making.   EKG Interpretation None       Medications  sodium chloride 0.9 % bolus 1,000 mL (0 mLs Intravenous Stopped 02/08/16 1135)    And  sodium chloride 0.9 % bolus 1,000 mL (0 mLs Intravenous Stopped 02/08/16 1018)    And  0.9 %  sodium chloride infusion ( Intravenous Stopped 02/08/16 1409)  insulin aspart protamine- aspart (NOVOLOG MIX 70/30) injection 70 Units (70 Units Subcutaneous Given 02/08/16 1223)    Orders Placed This Encounter   Procedures  . Urine culture  . Basic metabolic panel  . CBC with Differential  . Blood gas, venous  . Urinalysis, Routine w reflex microscopic  . Urine microscopic-add on  . Consult to case management  . POC CBG, ED  . POC Urine Pregnancy, ED (do NOT order at [facility])  . I-Stat venous blood gas, ED  . CBG monitoring, ED  . POC CBG, ED  . CBG monitoring, ED  . Insert peripheral IV    MDM   Final diagnoses:  Hyperglycemia    Robyn Galati presents with hyperglycemia as well as polydipsia and polyuria for the past few days.  Findings and plan of care discussed with Gerhard Munch, MD.   This patient's presentation and CBG require  rule out of DKA. Lab work shows no evidence of DKA or anion gap acidosis. Patient treated as a simple hyperglycemia patient. Case management consult placed due to the patient having difficulty obtaining her medications. Patient was reevaluated multiple times during her stay in the ED. Patient voiced no changes during this time. Patient requested prescriptions for her insulin tied her over until she has her appointment at the [facility] on May 8. Patient's reported dose of insulin is a rather large amount. Pt was prescribed a somewhat lower dose for safety. This amount can be adjusted by her PCP when she meets with them on May 8. Return precautions discussed. Patient voiced understanding of these instructions, agreed to the plan, and is comfortable with discharge.   Filed Vitals:   02/08/16 0900 02/08/16 0901 02/08/16 0930  BP: 145/76 145/76 109/69  Pulse:  91 91  Temp:  98.7 F (37.1 C)   TempSrc:  Oral   Resp:  16   Height:   (1.626 m)   Weight:  102.059 kg   SpO2:  100% 100%   Filed Vitals:   02/08/16 1000 02/08/16 1045 02/08/16 1115 02/08/16 1145  BP: 119/64 131/77 100/48 133/74  Pulse: 76 82 81 80  Temp:      TempSrc:      Resp:   16 16  Height:      Weight:      SpO2: 100% 100% 100% 100%   Filed Vitals:    02/08/16 1045 02/08/16 1115 02/08/16 1145 02/08/16 1402  BP: 131/77 100/48 133/74 119/69  Pulse: 82 81 80 81  Temp:      TempSrc:      Resp:  Height:      Weight:      SpO2: 100% 100% 100% 100%       Anselm Pancoast, PA-C 02/08/16 1530  Gerhard Munch, MD 02/09/16 1058

## 2016-02-08 NOTE — ED Notes (Addendum)
Pt arrived with complaints of high blood sugar. Last took humalog Friday, per pt. States running out of medication and unable to obtain insurance card in order to get more. Case worker/manager requested. CBG currently 505. No other complaints, other than polydipsia and polyuria.

## 2016-02-08 NOTE — ED Notes (Signed)
CBG- 505 

## 2016-02-08 NOTE — Discharge Planning (Signed)
ERCM contacted to regarding medication assistance.  Pt has appointment with Julianne HandlerLachina Hollis, FNP at [facility] @ 11:00.  This entitles her to utilize the [facility] for Rx needs.

## 2016-02-08 NOTE — Progress Notes (Signed)
Spoke to patient regarding primary care resources and the [facility] card. Patient sts she has applied for either the cone discount or the orange card with D.Boyd at [facility] but has not heard anything back from the clinic about this process. I attempted to follow up with D.Boyd at the clinic but unsuccessful. I will follow up regarding this issue with the patient and clinic. My contact information provided for ay future questions or concerns. No other Community Health & Eligibility Specialist needs identified at this time.  Buddy DutyFelicia Evans [facility] [phone]

## 2016-02-08 NOTE — ED Notes (Signed)
Pt had all belongings at d/c.  

## 2016-02-09 LAB — URINE CULTURE

## 2016-02-15 ENCOUNTER — Other Ambulatory Visit: Payer: Self-pay | Admitting: Pharmacist

## 2016-02-15 NOTE — Telephone Encounter (Signed)
[facility]. Patient is not a patient here. Will forward request to Jill Shaw, who will see patient 02/21/16.

## 2016-02-16 ENCOUNTER — Other Ambulatory Visit: Payer: Self-pay | Admitting: Family Medicine

## 2016-02-16 MED ORDER — INSULIN LISPRO PROT & LISPRO (75-25 MIX) 100 UNIT/ML ~~LOC~~ SUSP: 50.0000 [IU] | Freq: Two times a day (BID) | SUBCUTANEOUS | Status: DC

## 2016-02-16 MED FILL — !HUMALOG MIX 75-25 KWIKPEN: (75-25) 100 | 6 days supply | Qty: 6 | Fill #0

## 2016-02-21 ENCOUNTER — Ambulatory Visit: Payer: Medicaid - Out of State | Admitting: Family Medicine

## 2016-02-28 ENCOUNTER — Other Ambulatory Visit: Payer: Self-pay | Admitting: Internal Medicine

## 2016-02-28 MED ORDER — HUMALOG MIX 75/25 KWIKPEN (75-25) 100 UNIT/ML ~~LOC~~ SUPN: 70.0000 [IU] | PEN_INJECTOR | Freq: Two times a day (BID) | SUBCUTANEOUS | Status: DC

## 2016-03-01 MED FILL — HUMALOG MIX 75-25 KWIKPEN: (75-25) 100 | 9 days supply | Qty: 9 | Fill #1

## 2016-03-17 ENCOUNTER — Other Ambulatory Visit: Payer: Self-pay | Admitting: *Deleted

## 2016-03-17 MED ORDER — HUMALOG MIX 75/25 KWIKPEN (75-25) 100 UNIT/ML ~~LOC~~ SUPN: 70.0000 [IU] | PEN_INJECTOR | Freq: Two times a day (BID) | SUBCUTANEOUS | Status: DC

## 2016-03-22 ENCOUNTER — Other Ambulatory Visit: Payer: Self-pay | Admitting: Internal Medicine

## 2016-03-22 MED ORDER — INSULIN ASPART PROT & ASPART (70-30 MIX) 100 UNIT/ML ~~LOC~~ SUSP: 60.0000 [IU] | Freq: Two times a day (BID) | SUBCUTANEOUS | Status: DC

## 2016-03-25 DIAGNOSIS — N182 Chronic kidney disease, stage 2 (mild): Secondary | ICD-10-CM | POA: Insufficient documentation

## 2016-03-27 ENCOUNTER — Other Ambulatory Visit: Payer: Self-pay | Admitting: Family Medicine

## 2016-03-27 ENCOUNTER — Encounter: Payer: Self-pay | Admitting: Family Medicine

## 2016-03-27 ENCOUNTER — Ambulatory Visit (INDEPENDENT_AMBULATORY_CARE_PROVIDER_SITE_OTHER): Payer: No Typology Code available for payment source | Admitting: Family Medicine

## 2016-03-27 VITALS — BP 137/68 | HR 91 | Temp 98.5°F | Resp 16 | Ht 64.0 in | Wt 251.0 lb

## 2016-03-27 DIAGNOSIS — Z794 Long term (current) use of insulin: Secondary | ICD-10-CM | POA: Insufficient documentation

## 2016-03-27 DIAGNOSIS — K581 Irritable bowel syndrome with constipation: Secondary | ICD-10-CM

## 2016-03-27 DIAGNOSIS — F17200 Nicotine dependence, unspecified, uncomplicated: Secondary | ICD-10-CM | POA: Insufficient documentation

## 2016-03-27 DIAGNOSIS — E66813 Obesity, class 3: Secondary | ICD-10-CM | POA: Insufficient documentation

## 2016-03-27 DIAGNOSIS — M25569 Pain in unspecified knee: Secondary | ICD-10-CM

## 2016-03-27 DIAGNOSIS — I10 Essential (primary) hypertension: Secondary | ICD-10-CM | POA: Insufficient documentation

## 2016-03-27 DIAGNOSIS — G8929 Other chronic pain: Secondary | ICD-10-CM | POA: Insufficient documentation

## 2016-03-27 DIAGNOSIS — E119 Type 2 diabetes mellitus without complications: Secondary | ICD-10-CM | POA: Insufficient documentation

## 2016-03-27 DIAGNOSIS — Z23 Encounter for immunization: Secondary | ICD-10-CM

## 2016-03-27 LAB — POCT URINALYSIS DIP (DEVICE)
Bilirubin Urine: NEGATIVE
GLUCOSE, UA: NEGATIVE mg/dL
Hgb urine dipstick: NEGATIVE
KETONES UR: NEGATIVE mg/dL
LEUKOCYTES UA: NEGATIVE
Nitrite: NEGATIVE
PROTEIN: 30 mg/dL — AB
SPECIFIC GRAVITY, URINE: 1.015 (ref 1.005–1.030)
Urobilinogen, UA: 1 mg/dL (ref 0.0–1.0)
pH: 6.5 (ref 5.0–8.0)

## 2016-03-27 LAB — CBC WITH DIFFERENTIAL/PLATELET
BASOS ABS: 0 {cells}/uL (ref 0–200)
Basophils Relative: 0 %
EOS ABS: 180 {cells}/uL (ref 15–500)
Eosinophils Relative: 2 %
HCT: 32 % — ABNORMAL LOW (ref 35.0–45.0)
HEMOGLOBIN: 9.4 g/dL — AB (ref 11.7–15.5)
LYMPHS ABS: 1980 {cells}/uL (ref 850–3900)
Lymphocytes Relative: 22 %
MCH: 21.2 pg — AB (ref 27.0–33.0)
MCHC: 29.4 g/dL — AB (ref 32.0–36.0)
MCV: 72.1 fL — AB (ref 80.0–100.0)
Monocytes Absolute: 540 cells/uL (ref 200–950)
Monocytes Relative: 6 %
NEUTROS ABS: 6300 {cells}/uL (ref 1500–7800)
NEUTROS PCT: 70 %
Platelets: 251 10*3/uL (ref 140–400)
RBC: 4.44 MIL/uL (ref 3.80–5.10)
RDW: 20.8 % — ABNORMAL HIGH (ref 11.0–15.0)
WBC: 9 10*3/uL (ref 3.8–10.8)

## 2016-03-27 LAB — TSH: TSH: 1.11 m[IU]/L

## 2016-03-27 LAB — HEMOGLOBIN A1C
Hgb A1c MFr Bld: 7.6 % — ABNORMAL HIGH (ref <5.7)
Mean Plasma Glucose: 171 mg/dL

## 2016-03-27 LAB — GLUCOSE, CAPILLARY: Glucose-Capillary: 71 mg/dL (ref 65–99)

## 2016-03-27 MED ORDER — LISINOPRIL 20 MG PO TABS: 20.0000 mg | ORAL_TABLET | Freq: Every day | ORAL | Status: DC

## 2016-03-27 MED ORDER — TRUE METRIX METER W/DEVICE KIT: 1.0000 | PACK | Freq: Three times a day (TID) | Status: DC

## 2016-03-27 MED ORDER — HYDROCHLOROTHIAZIDE 25 MG PO TABS: 25.0000 mg | ORAL_TABLET | Freq: Every day | ORAL | Status: DC

## 2016-03-27 MED ORDER — KETOROLAC TROMETHAMINE 60 MG/2ML IM SOLN
60.0000 mg | Freq: Once | INTRAMUSCULAR | Status: AC
  Administered 2016-03-27: 60 mg via INTRAMUSCULAR

## 2016-03-27 MED ORDER — LINACLOTIDE 145 MCG PO CAPS: 145.0000 ug | ORAL_CAPSULE | Freq: Every day | ORAL | Status: DC

## 2016-03-27 MED ORDER — GLUCOSE BLOOD VI STRP: ORAL_STRIP | Status: DC

## 2016-03-27 MED FILL — TRUE METRIX TEST STRIP: 25 days supply | Qty: 100 | Fill #0

## 2016-03-27 MED FILL — HYDROCHLOROTHIAZIDE 25 MG T: 25 | 30 days supply | Qty: 30 | Fill #0

## 2016-03-27 MED FILL — !LINZESS 145 MCG CAPSULE: 145 | 30 days supply | Qty: 30 | Fill #0

## 2016-03-27 MED FILL — ?LISINOPRIL 20 MG TABLET: 20 | 30 days supply | Qty: 30 | Fill #0

## 2016-03-27 MED FILL — TRUE METRIX BLOOD GLUCOSE M: W/DEVICE | 365 days supply | Qty: 1 | Fill #0

## 2016-03-27 NOTE — Patient Instructions (Addendum)
Will follow up in 1 month for diabetes mellitus and hypertensionDiabetes and Foot Care Diabetes may cause you to have problems because of poor blood supply (circulation) to your feet and legs. This may cause the skin on your feet to become thinner, break easier, and heal more slowly. Your skin may become dry, and the skin may peel and crack. You may also have nerve damage in your legs and feet causing decreased feeling in them. You may not notice minor injuries to your feet that could lead to infections or more serious problems. Taking care of your feet is one of the most important things you can do for yourself.  HOME CARE INSTRUCTIONS  Wear shoes at all times, even in the house. Do not go barefoot. Bare feet are easily injured.  Check your feet daily for blisters, cuts, and redness. If you cannot see the bottom of your feet, use a mirror or ask someone for help.  Wash your feet with warm water (do not use hot water) and mild soap. Then pat your feet and the areas between your toes until they are completely dry. Do not soak your feet as this can dry your skin.  Apply a moisturizing lotion or petroleum jelly (that does not contain alcohol and is unscented) to the skin on your feet and to dry, brittle toenails. Do not apply lotion between your toes.  Trim your toenails straight across. Do not dig under them or around the cuticle. File the edges of your nails with an emery board or nail file.  Do not cut corns or calluses or try to remove them with medicine.  Wear clean socks or stockings every day. Make sure they are not too tight. Do not wear knee-high stockings since they may decrease blood flow to your legs.  Wear shoes that fit properly and have enough cushioning. To break in new shoes, wear them for just a few hours a day. This prevents you from injuring your feet. Always look in your shoes before you put them on to be sure there are no objects inside.  Do not cross your legs. This may  decrease the blood flow to your feet.  If you find a minor scrape, cut, or break in the skin on your feet, keep it and the skin around it clean and dry. These areas may be cleansed with mild soap and water. Do not cleanse the area with peroxide, alcohol, or iodine.  When you remove an adhesive bandage, be sure not to damage the skin around it.  If you have a wound, look at it several times a day to make sure it is healing.  Do not use heating pads or hot water bottles. They may burn your skin. If you have lost feeling in your feet or legs, you may not know it is happening until it is too late.  Make sure your health care provider performs a complete foot exam at least annually or more often if you have foot problems. Report any cuts, sores, or bruises to your health care provider immediately. SEEK MEDICAL CARE IF:   You have an injury that is not healing.  You have cuts or breaks in the skin.  You have an ingrown nail.  You notice redness on your legs or feet.  You feel burning or tingling in your legs or feet.  You have pain or cramps in your legs and feet.  Your legs or feet are numb.  Your feet always feel cold. SEEK  IMMEDIATE MEDICAL CARE IF:   There is increasing redness, swelling, or pain in or around a wound.  There is a red line that goes up your leg.  Pus is coming from a wound.  You develop a fever or as directed by your health care provider.  You notice a bad smell coming from an ulcer or wound.   This information is not intended to replace advice given to you by your health care provider. Make sure you discuss any questions you have with your health care provider.   Document Released: 09/29/2000 Document Revised: 06/04/2013 Document Reviewed: 03/11/2013 Elsevier Interactive Patient Education 2016 [employer]. Diabetes Mellitus and Food It is important for you to manage your blood sugar (glucose) level. Your blood glucose level can be greatly affected by what  you eat. Eating healthier foods in the appropriate amounts throughout the day at about the same time each day will help you control your blood glucose level. It can also help slow or prevent worsening of your diabetes mellitus. Healthy eating may even help you improve the level of your blood pressure and reach or maintain a healthy weight.  General recommendations for healthful eating and cooking habits include:  Eating meals and snacks regularly. Avoid going long periods of time without eating to lose weight.  Eating a diet that consists mainly of plant-based foods, such as fruits, vegetables, nuts, legumes, and whole grains.  Using low-heat cooking methods, such as baking, instead of high-heat cooking methods, such as deep frying. Work with your dietitian to make sure you understand how to use the Nutrition Facts information on food labels. HOW CAN FOOD AFFECT ME? Carbohydrates Carbohydrates affect your blood glucose level more than any other type of food. Your dietitian will help you determine how many carbohydrates to eat at each meal and teach you how to count carbohydrates. Counting carbohydrates is important to keep your blood glucose at a healthy level, especially if you are using insulin or taking certain medicines for diabetes mellitus. Alcohol Alcohol can cause sudden decreases in blood glucose (hypoglycemia), especially if you use insulin or take certain medicines for diabetes mellitus. Hypoglycemia can be a life-threatening condition. Symptoms of hypoglycemia (sleepiness, dizziness, and disorientation) are similar to symptoms of having too much alcohol.  If your health care provider has given you approval to drink alcohol, do so in moderation and use the following guidelines:  Women should not have more than one drink per day, and men should not have more than two drinks per day. One drink is equal to:  12 oz of beer.  5 oz of wine.  1 oz of hard liquor.  Do not drink on an  empty stomach.  Keep yourself hydrated. Have water, diet soda, or unsweetened iced tea.  Regular soda, juice, and other mixers might contain a lot of carbohydrates and should be counted. WHAT FOODS ARE NOT RECOMMENDED? As you make food choices, it is important to remember that all foods are not the same. Some foods have fewer nutrients per serving than other foods, even though they might have the same number of calories or carbohydrates. It is difficult to get your body what it needs when you eat foods with fewer nutrients. Examples of foods that you should avoid that are high in calories and carbohydrates but low in nutrients include:  Trans fats (most processed foods list trans fats on the Nutrition Facts label).  Regular soda.  Juice.  Candy.  Sweets, such as cake, pie, doughnuts, and  cookies.  Fried foods. WHAT FOODS CAN I EAT? Eat nutrient-rich foods, which will nourish your body and keep you healthy. The food you should eat also will depend on several factors, including:  The calories you need.  The medicines you take.  Your weight.  Your blood glucose level.  Your blood pressure level.  Your cholesterol level. You should eat a variety of foods, including:  Protein.  Lean cuts of meat.  Proteins low in saturated fats, such as fish, egg whites, and beans. Avoid processed meats.  Fruits and vegetables.  Fruits and vegetables that may help control blood glucose levels, such as apples, mangoes, and yams.  Dairy products.  Choose fat-free or low-fat dairy products, such as milk, yogurt, and cheese.  Grains, bread, pasta, and rice.  Choose whole grain products, such as multigrain bread, whole oats, and brown rice. These foods may help control blood pressure.  Fats.  Foods containing healthful fats, such as nuts, avocado, olive oil, canola oil, and fish. DOES EVERYONE WITH DIABETES MELLITUS HAVE THE SAME MEAL PLAN? Because every person with diabetes mellitus is  different, there is not one meal plan that works for everyone. It is very important that you meet with a dietitian who will help you create a meal plan that is just right for you.   This information is not intended to replace advice given to you by your health care provider. Make sure you discuss any questions you have with your health care provider.   Document Released: 06/29/2005 Document Revised: 10/23/2014 Document Reviewed: 08/29/2013 Elsevier Interactive Patient Education [employer]

## 2016-03-27 NOTE — Progress Notes (Signed)
Subjective:    Patient ID: Jill Shaw, female    DOB: 1976/02/11, 41 y.o.   MRN: [id number]  HPI  Jill Shaw, a 40 year old patient. She was a patient of [facility] in [place], [place] prior to relocating to area. She says that she has been lost to follow-up. She she has primarily been using the emergency department for primary needs since relocating to area. .  She has a history of type 2 diabetes mellitus. She says that she has been on several different types of insulin for diabetes. She is currently on Humalog 75/25 70 units twice daily. She states that she has been checking blood sugars periodically with her mother's glucometer. She says that she had breakfast around 8 am and blood sugar was 180 at breakfast. She took anti-diabetic medication at that time. Blood sugar was 71 on arrival. She denies current symptoms of hyperglycemia.  Patient denies foot ulcerations, increase appetite, nausea, polydipsia, polyuria, visual disturbances, vomitting and weight loss.   She also has a history of hypertension. She is not exercising or following a lowfat, low sodium diet. She also does not check blood pressures at home.   Patient denies chest pain, fatigue, lower extremity edema, orthopnea and palpitations.  Cardiovascular risk factors: obesity (BMI >= 30 kg/m2), sedentary lifestyle and smoking/ tobacco exposure.   Patient is also complaining of bilateral knee pain.She says that knee pain has been controlled on hydrocodone in the past. She has been out of medication over the past several months. She has not attempted any OTC interventions to alleviate symptoms. She says that current pain intensity is 8/10. Knee pain is aggravated by standing, bending, transitioning from sitting to standing, and pivoting movements.   Past Medical History  Diagnosis Date  . Arthritis   . Diabetes mellitus without complication ([place])   . Hypertension   . Sickle cell anemia (HCC)    Immunization History   Administered Date(s) Administered  . Tdap 03/27/2016   Past Surgical History  Procedure Laterality Date  . Cesarean section     Social History   Social History  . Marital Status: Single    Spouse Name: N/A  . Number of Children: N/A  . Years of Education: N/A   Occupational History  . Not on file.   Social History Main Topics  . Smoking status: Current Every Day Smoker -- 0.50 packs/day    Types: Cigarettes  . Smokeless tobacco: Not on file  . Alcohol Use: No  . Drug Use: No  . Sexual Activity: Not on file   Other Topics Concern  . Not on file   Social History Narrative       Review of Systems  Constitutional: Negative.  Negative for unexpected weight change.  HENT: Negative.  Negative for congestion and dental problem.   Eyes: Negative.  Negative for photophobia and visual disturbance.  Respiratory: Negative.  Negative for cough.   Cardiovascular: Negative.   Gastrointestinal: Positive for constipation. Negative for vomiting and rectal pain.  Endocrine: Negative.  Negative for polydipsia, polyphagia and polyuria.  Genitourinary: Negative.  Negative for dysuria and dyspareunia.  Musculoskeletal: Positive for myalgias (Bilateral knee pain).  Skin: Negative.   Allergic/Immunologic: Negative.  Negative for immunocompromised state.  Neurological: Negative.   Hematological: Negative.   Psychiatric/Behavioral: Negative.  Negative for suicidal ideas and sleep disturbance.       Objective:   Physical Exam  Constitutional: She is oriented to person, place, and time. She appears well-developed and  well-nourished.  HENT:  Head: Normocephalic and atraumatic.  Right Ear: External ear normal.  Left Ear: External ear normal.  Nose: Nose normal.  Mouth/Throat: Oropharynx is clear and moist.  Eyes: Conjunctivae and EOM are normal. Pupils are equal, round, and reactive to light.  Neck: Normal range of motion. Neck supple.  Cardiovascular: Normal rate, regular rhythm,  normal heart sounds and intact distal pulses.   Pulmonary/Chest: Effort normal and breath sounds normal.  Abdominal: Soft. Bowel sounds are normal. There is generalized tenderness.  Increased abdominal girth  Musculoskeletal:       Right knee: She exhibits decreased range of motion. She exhibits no swelling.       Left knee: She exhibits decreased range of motion. She exhibits no swelling.  Neurological: She is alert and oriented to person, place, and time. She has normal reflexes.  Skin: Skin is warm and dry.  Psychiatric: She has a normal mood and affect. Her behavior is normal. Judgment and thought content normal.      BP 137/68 mmHg  Pulse 91  Temp(Src) 98.5 F (36.9 C) (Oral)  Resp 16  Ht _0  (1.626 m)  Wt 251 lb (113.853 kg)  BMI 43.06 kg/m2  SpO2 99%  LMP 02/25/2016 Assessment & Plan:  1. Type 2 diabetes mellitus without complication, with long-term current use of insulin (HCC) CBG is 71 at present. Will review labs and follow up by phone for possible medication adjustments.   - insulin lispro protamine-lispro (HUMALOG 75/25 MIX) (75-25) 100 UNIT/ML SUSP injection; Inject 70 Units into the skin 2 (two) times daily with a meal. - Urinalysis Dipstick - Glucose (CBG) - Hemoglobin A1c - COMPLETE METABOLIC PANEL WITH GFR - CBC with Differential - Blood Glucose Monitoring Suppl (TRUE METRIX METER) w/Device KIT; 1 each by Does not apply route 4 (four) times daily -  before meals and at bedtime.  Dispense: 1 kit; Refill: 0 - glucose blood (TRUE METRIX BLOOD GLUCOSE TEST) test strip; Use as instructed  Dispense: 100 each; Refill: 12 - lisinopril (PRINIVIL,ZESTRIL) 20 MG tablet; Take 1 tablet (20 mg total) by mouth daily.  Dispense: 30 tablet; Refill: 5  2. Essential hypertension Blood pressure is at goal on current medication regimen. Will check GFR and creatinine level.  - Urinalysis Dipstick - COMPLETE METABOLIC PANEL WITH GFR - CBC with Differential - lisinopril  (PRINIVIL,ZESTRIL) 20 MG tablet; Take 1 tablet (20 mg total) by mouth daily.  Dispense: 30 tablet; Refill: 5 - hydrochlorothiazide (HYDRODIURIL) 25 MG tablet; Take 1 tablet (25 mg total) by mouth daily.  Dispense: 30 tablet; Refill: 5  3. Morbid obesity, unspecified obesity type ([place]) - TSH - Lipid Panel; Future  4. Irritable bowel syndrome with constipation  - linaclotide (LINZESS) 145 MCG CAPS capsule; Take 1 capsule (145 mcg total) by mouth daily before breakfast.  Dispense: 30 capsule; Refill: 0  5. Knee pain, chronic, unspecified laterality - tizanidine (ZANAFLEX) 6 MG capsule; Take 6 mg by mouth 3 (three) times daily. - Sedimentation Rate - ketorolac (TORADOL) injection 60 mg; Inject 2 mLs (60 mg total) into the muscle once.  6. Tobacco dependence Smoking cessation instruction/counseling given:  counseled patient on the dangers of tobacco use, advised patient to stop smoking, and reviewed strategies to maximize success  7. Need for Tdap vaccination - Tdap vaccine greater than or equal to 7yo IM   Routine Health Maintenance:   Recommend monthly self breast exam Recommend carbohydrate modified diet   RTC: Follow up by phone with  laboratory results. Follow up in 1 month for DMII and IBS    Zoanne Newill M, FNP  The patient was given clear instructions to go to ER or return to medical center if symptoms do not improve, worsen or new problems develop. The patient verbalized understanding. Will notify patient with laboratory results.

## 2016-03-28 LAB — COMPLETE METABOLIC PANEL WITH GFR
ALBUMIN: 3.9 g/dL (ref 3.6–5.1)
ALK PHOS: 72 U/L (ref 33–115)
ALT: 20 U/L (ref 6–29)
AST: 38 U/L — AB (ref 10–30)
BUN: 10 mg/dL (ref 7–25)
CALCIUM: 9.3 mg/dL (ref 8.6–10.2)
CO2: 25 mmol/L (ref 20–31)
CREATININE: 1.1 mg/dL (ref 0.50–1.10)
Chloride: 107 mmol/L (ref 98–110)
GFR, Est African American: 73 mL/min (ref >=60)
GFR, Est Non African American: 63 mL/min (ref >=60)
GLUCOSE: 115 mg/dL — AB (ref 65–99)
POTASSIUM: 3.7 mmol/L (ref 3.5–5.3)
SODIUM: 140 mmol/L (ref 135–146)
Total Bilirubin: 0.4 mg/dL (ref 0.2–1.2)
Total Protein: 6.5 g/dL (ref 6.1–8.1)

## 2016-03-28 LAB — HEPATITIS PANEL, ACUTE
HCV AB: NEGATIVE
HEP A IGM: NONREACTIVE
HEP B C IGM: NONREACTIVE
Hepatitis B Surface Ag: NEGATIVE

## 2016-03-28 LAB — SEDIMENTATION RATE: Sed Rate: 11 mm/hr (ref 0–20)

## 2016-03-29 ENCOUNTER — Other Ambulatory Visit: Payer: Self-pay | Admitting: Family Medicine

## 2016-03-29 DIAGNOSIS — E119 Type 2 diabetes mellitus without complications: Secondary | ICD-10-CM

## 2016-03-29 DIAGNOSIS — Z794 Long term (current) use of insulin: Principal | ICD-10-CM

## 2016-03-29 MED ORDER — INSULIN LISPRO PROT & LISPRO (75-25 MIX) 100 UNIT/ML ~~LOC~~ SUSP: 50.0000 [IU] | Freq: Two times a day (BID) | SUBCUTANEOUS | Status: DC

## 2016-03-30 NOTE — Progress Notes (Signed)
Called patient, no answer. Left message for patient to return call and left call back number.

## 2016-03-31 NOTE — Progress Notes (Signed)
Called, no answer. Left message for patient to call us back and left callback number. Thanks!

## 2016-04-03 MED FILL — !HUMALOG MIX 75/25 VIAL: (75-25) 100 | 10 days supply | Qty: 10 | Fill #0

## 2016-04-03 NOTE — Progress Notes (Signed)
Patient returned call, I advised of hgba1c levels and to decrease Humalog insulin to 50 units twice daily. Reminded patient to check blood sugars every morning fasting and before each meal. Patient verbalized understanding. Thanks!

## 2016-04-03 NOTE — Progress Notes (Signed)
Called, no answer. Left message asking patient to call back. Thanks!  

## 2016-04-07 ENCOUNTER — Other Ambulatory Visit: Payer: Self-pay | Admitting: *Deleted

## 2016-04-07 DIAGNOSIS — K581 Irritable bowel syndrome with constipation: Secondary | ICD-10-CM

## 2016-04-07 MED ORDER — LINACLOTIDE 145 MCG PO CAPS: 145.0000 ug | ORAL_CAPSULE | Freq: Every day | ORAL | Status: DC

## 2016-04-07 MED FILL — $HUMALOG MIX 75-25 KWIKPEN: (75-25) 100 | 30 days supply | Qty: 42 | Fill #0

## 2016-04-11 ENCOUNTER — Other Ambulatory Visit: Payer: Self-pay

## 2016-04-11 MED ORDER — OMEPRAZOLE 40 MG PO CPDR: 40.0000 mg | DELAYED_RELEASE_CAPSULE | Freq: Every day | ORAL | Status: DC

## 2016-04-11 NOTE — Telephone Encounter (Signed)
Refill for omeprazole sent into pharmacy. Thanks ! 

## 2016-05-04 ENCOUNTER — Ambulatory Visit: Payer: No Typology Code available for payment source | Admitting: Family Medicine

## 2016-06-21 ENCOUNTER — Other Ambulatory Visit: Payer: Self-pay | Admitting: Family Medicine

## 2016-06-21 DIAGNOSIS — K581 Irritable bowel syndrome with constipation: Secondary | ICD-10-CM

## 2016-06-21 MED FILL — $HUMALOG MIX 75-25 KWIKPEN: (75-25) 100 | 32 days supply | Qty: 45 | Fill #1

## 2016-07-19 MED FILL — $HUMALOG MIX 75-25 KWIKPEN: (75-25) 100 | 32 days supply | Qty: 45 | Fill #2

## 2016-08-14 ENCOUNTER — Telehealth: Payer: Self-pay | Admitting: Family Medicine

## 2016-08-14 NOTE — Telephone Encounter (Signed)
Patient is stating she would like to be placed on her hydrocodone and not be on the shot she has received in June....  She states she does not understand why she can't be placed on her medication like in the past.    Please follow up

## 2016-08-18 ENCOUNTER — Other Ambulatory Visit: Payer: Self-pay | Admitting: Internal Medicine

## 2016-08-18 NOTE — Telephone Encounter (Signed)
This patient needs appointment with her PCP. She has not been seen since June.

## 2016-08-21 MED FILL — $HUMALOG MIX 75-25 KWIKPEN: (75-25) 100 | 32 days supply | Qty: 45 | Fill #3

## 2016-09-15 MED FILL — $HUMALOG MIX 75-25 KWIKPEN: (75-25) 100 | 32 days supply | Qty: 45 | Fill #4

## 2016-09-22 ENCOUNTER — Encounter: Payer: Self-pay | Admitting: Family Medicine

## 2016-09-22 ENCOUNTER — Ambulatory Visit: Payer: Medicaid - Out of State | Admitting: Family Medicine

## 2016-09-22 ENCOUNTER — Ambulatory Visit (INDEPENDENT_AMBULATORY_CARE_PROVIDER_SITE_OTHER): Payer: Medicaid Other | Admitting: Family Medicine

## 2016-09-22 VITALS — BP 171/81 | HR 91 | Temp 98.6°F | Resp 18 | Ht 64.0 in | Wt 266.0 lb

## 2016-09-22 DIAGNOSIS — I10 Essential (primary) hypertension: Secondary | ICD-10-CM

## 2016-09-22 DIAGNOSIS — M797 Fibromyalgia: Secondary | ICD-10-CM | POA: Diagnosis not present

## 2016-09-22 DIAGNOSIS — Z794 Long term (current) use of insulin: Secondary | ICD-10-CM | POA: Diagnosis not present

## 2016-09-22 DIAGNOSIS — G8929 Other chronic pain: Secondary | ICD-10-CM

## 2016-09-22 DIAGNOSIS — K5900 Constipation, unspecified: Secondary | ICD-10-CM | POA: Diagnosis not present

## 2016-09-22 DIAGNOSIS — E119 Type 2 diabetes mellitus without complications: Secondary | ICD-10-CM | POA: Diagnosis not present

## 2016-09-22 DIAGNOSIS — J069 Acute upper respiratory infection, unspecified: Secondary | ICD-10-CM | POA: Diagnosis not present

## 2016-09-22 DIAGNOSIS — M25569 Pain in unspecified knee: Secondary | ICD-10-CM

## 2016-09-22 LAB — POCT URINALYSIS DIP (DEVICE)
Bilirubin Urine: NEGATIVE
GLUCOSE, UA: NEGATIVE mg/dL
Ketones, ur: NEGATIVE mg/dL
LEUKOCYTES UA: NEGATIVE
NITRITE: NEGATIVE
Protein, ur: NEGATIVE mg/dL
Specific Gravity, Urine: 1.01 (ref 1.005–1.030)
Urobilinogen, UA: 0.2 mg/dL (ref 0.0–1.0)
pH: 6 (ref 5.0–8.0)

## 2016-09-22 LAB — CBC WITH DIFFERENTIAL/PLATELET
BASOS PCT: 0 %
Basophils Absolute: 0 cells/uL (ref 0–200)
EOS PCT: 3 %
Eosinophils Absolute: 252 cells/uL (ref 15–500)
HCT: 30.3 % — ABNORMAL LOW (ref 35.0–45.0)
HEMOGLOBIN: 8.9 g/dL — AB (ref 11.7–15.5)
LYMPHS ABS: 2520 {cells}/uL (ref 850–3900)
Lymphocytes Relative: 30 %
MCH: 20 pg — ABNORMAL LOW (ref 27.0–33.0)
MCHC: 29.4 g/dL — AB (ref 32.0–36.0)
MCV: 67.9 fL — ABNORMAL LOW (ref 80.0–100.0)
Monocytes Absolute: 504 cells/uL (ref 200–950)
Monocytes Relative: 6 %
NEUTROS ABS: 5124 {cells}/uL (ref 1500–7800)
Neutrophils Relative %: 61 %
Platelets: 259 10*3/uL (ref 140–400)
RBC: 4.46 MIL/uL (ref 3.80–5.10)
RDW: 21.4 % — ABNORMAL HIGH (ref 11.0–15.0)
WBC: 8.4 10*3/uL (ref 3.8–10.8)

## 2016-09-22 LAB — COMPLETE METABOLIC PANEL WITH GFR
ALBUMIN: 3.9 g/dL (ref 3.6–5.1)
ALT: 28 U/L (ref 6–29)
AST: 66 U/L — ABNORMAL HIGH (ref 10–30)
Alkaline Phosphatase: 71 U/L (ref 33–115)
BILIRUBIN TOTAL: 0.4 mg/dL (ref 0.2–1.2)
BUN: 8 mg/dL (ref 7–25)
CO2: 26 mmol/L (ref 20–31)
Calcium: 9 mg/dL (ref 8.6–10.2)
Chloride: 107 mmol/L (ref 98–110)
Creat: 0.93 mg/dL (ref 0.50–1.10)
GFR, EST NON AFRICAN AMERICAN: 78 mL/min (ref >=60)
Glucose, Bld: 42 mg/dL — ABNORMAL LOW (ref 65–99)
Potassium: 3.7 mmol/L (ref 3.5–5.3)
Sodium: 140 mmol/L (ref 135–146)
TOTAL PROTEIN: 6.4 g/dL (ref 6.1–8.1)

## 2016-09-22 MED ORDER — DOCUSATE SODIUM 100 MG PO CAPS: 100.0000 mg | ORAL_CAPSULE | Freq: Every day | ORAL | 1 refills | Status: DC

## 2016-09-22 MED ORDER — DULOXETINE HCL 30 MG PO CPEP: 30.0000 mg | ORAL_CAPSULE | Freq: Every day | ORAL | 3 refills | Status: DC

## 2016-09-22 MED ORDER — AZITHROMYCIN 250 MG PO TABS: ORAL_TABLET | ORAL | 0 refills | Status: DC

## 2016-09-22 MED ORDER — ACETAMINOPHEN-CODEINE 300-30 MG PO TABS: 1.0000 | ORAL_TABLET | Freq: Four times a day (QID) | ORAL | 0 refills | Status: DC | PRN

## 2016-09-22 MED FILL — ACETAMINOPHEN/COD #3 TABLET: 300-30 | 7 days supply | Qty: 30 | Fill #0

## 2016-09-22 MED FILL — DULoxetine HCL 30 MG CPEP: 30 | 30 days supply | Qty: 30 | Fill #0

## 2016-09-22 MED FILL — ?AZITHROMYCIN 250 MG TABLET: 250 | 5 days supply | Qty: 6 | Fill #0

## 2016-09-22 NOTE — Progress Notes (Signed)
Subjective:    Patient ID: Jill Shaw, female    DOB: 03/13/1976, 40 y.o.   MRN: [id number]  HPI  [name], a 40 year old patient presents complaining of generalized pain and for a follow up of type 2 diabetes mellitus and hypertension. She is currently on Humalog 75/25 70 units twice daily. She states that she has been checking blood sugars inconsistently.   She took anti-diabetic medication at that time. Blood sugar was 71 on arrival. She denies current symptoms of hyperglycemia.  Patient denies foot ulcerations, increase appetite, nausea, polydipsia, polyuria, visual disturbances, vomitting and weight loss.   She also has a history of hypertension. She is not exercising or following a lowfat, low sodium diet. She also does not check blood pressures at home.   Patient denies chest pain, fatigue, lower extremity edema, orthopnea and palpitations.  Cardiovascular risk factors: obesity (BMI >= 30 kg/m2), sedentary lifestyle and smoking/ tobacco exposure.   Patient is also complaining of generalized pain. She has a history of fibromyalgia.She says that knee pain has been controlled on hydrocodone in the past. She was previously followed by a pain clinic. She has been out of medication over the past several months. She has not attempted any OTC interventions to alleviate symptoms. She says that current pain intensity is 10/10. Knee pain is aggravated by standing, bending, transitioning from sitting to standing, and pivoting movements.   Past Medical History:  Diagnosis Date  . Arthritis   . Diabetes mellitus without complication (HCC)   . Hypertension   . Sickle cell trait (HCC)    Immunization History  Administered Date(s) Administered  . Pneumococcal Polysaccharide-23 03/28/2015  . Tdap 03/27/2016   Past Surgical History:  Procedure Laterality Date  . CESAREAN SECTION     Social History   Social History  . Marital status: Single    Spouse name: N/A  . Number of children:  N/A  . Years of education: N/A   Occupational History  . Not on file.   Social History Main Topics  . Smoking status: Current Every Day Smoker    Packs/day: 0.50    Types: Cigarettes  . Smokeless tobacco: Never Used  . Alcohol use No  . Drug use: No  . Sexual activity: Not on file   Other Topics Concern  . Not on file   Social History Narrative  . No narrative on file       Review of Systems  Constitutional: Positive for fatigue. Negative for fever and unexpected weight change.  HENT: Positive for postnasal drip and rhinorrhea. Negative for congestion and dental problem.   Eyes: Negative.  Negative for photophobia and visual disturbance.  Respiratory: Positive for cough.   Cardiovascular: Negative.   Gastrointestinal: Positive for constipation. Negative for rectal pain and vomiting.  Endocrine: Negative.  Negative for polydipsia, polyphagia and polyuria.  Genitourinary: Negative.  Negative for dyspareunia and dysuria.  Musculoskeletal: Positive for arthralgias, back pain and myalgias (Bilateral knee pain).  Skin: Negative.   Allergic/Immunologic: Negative.  Negative for immunocompromised state.  Neurological: Negative.   Hematological: Negative.   Psychiatric/Behavioral: Negative.  Negative for sleep disturbance and suicidal ideas.       Objective:   Physical Exam  Constitutional: She is oriented to person, place, and time. She appears well-developed and well-nourished.  HENT:  Head: Normocephalic and atraumatic.  Right Ear: External ear normal.  Left Ear: External ear normal.  Nose: Mucosal edema present.  Mouth/Throat: Posterior oropharyngeal edema present.  Eyes: Conjunctivae and EOM are normal. Pupils are equal, round, and reactive to light.  Neck: Normal range of motion. Neck supple.  Cardiovascular: Normal rate, regular rhythm, normal heart sounds and intact distal pulses.   Pulmonary/Chest: Effort normal and breath sounds normal.  Abdominal: Soft. Bowel  sounds are normal. There is generalized tenderness.  Increased abdominal girth  Musculoskeletal:       Right knee: She exhibits decreased range of motion. She exhibits no swelling.       Left knee: She exhibits decreased range of motion. She exhibits no swelling.  Neurological: She is alert and oriented to person, place, and time. She has normal reflexes.  Skin: Skin is warm and dry.  Psychiatric: She has a normal mood and affect. Her behavior is normal. Judgment and thought content normal.      BP (!) 171/81 (BP Location: Left Arm, Patient Position: Sitting, Cuff Size: Large)   Pulse 91   Temp 98.6 F (37 C) (Oral)   Resp 18   Ht 5\' 4"  (1.626 m)   Wt 266 lb (120.7 kg)   LMP 09/16/2016   SpO2 99%   BMI 45.66 kg/m  Assessment & Plan:  1. Essential hypertension Blood pressure is above goal on current medication regimen. She has not taken antihypertension medication today. There have been some probable compliance issues here. I have discussed with her the great importance of following the treatment plan exactly as directed in order to achieve a good medical outcome. - COMPLETE METABOLIC PANEL WITH GFR - CBC with Differential - lisinopril (PRINIVIL,ZESTRIL) 20 MG tablet; Take 1 tablet (20 mg total) by mouth daily.  Dispense: 30 tablet; Refill: 5  2. Type 2 diabetes mellitus without complication, with long-term current use of insulin (HCC)  - COMPLETE METABOLIC PANEL WITH GFR - CBC with Differential - Hemoglobin A1C - lisinopril (PRINIVIL,ZESTRIL) 20 MG tablet; Take 1 tablet (20 mg total) by mouth daily.  Dispense: 30 tablet; Refill: 5 - insulin lispro protamine-lispro (HUMALOG 75/25 MIX) (75-25) 100 UNIT/ML SUSP injection; Inject 50 Units into the skin 2 (two) times daily with a meal.  Dispense: 10 mL; Refill: 5  3. Chronic knee pain, unspecified laterality - DULoxetine (CYMBALTA) 30 MG capsule; Take 1 capsule (30 mg total) by mouth daily.  Dispense: 30 capsule; Refill: 3 -  Acetaminophen-Codeine 300-30 MG tablet; Take 1 tablet by mouth every 6 (six) hours as needed for pain.  Dispense: 30 tablet; Refill: 0  4. Fibromyalgia - DULoxetine (CYMBALTA) 30 MG capsule; Take 1 capsule (30 mg total) by mouth daily.  Dispense: 30 capsule; Refill: 3 - Acetaminophen-Codeine 300-30 MG tablet; Take 1 tablet by mouth every 6 (six) hours as needed for pain.  Dispense: 30 tablet; Refill: 0  Reviewed [place] Substance Reporting system prior to prescribing opiate medication, no inconsistencies noted. Will start a trial of Cymbalta for generalized pain associated with fibromyalgia.    5. Acute upper respiratory infection - azithromycin (ZITHROMAX) 250 MG tablet; Take 500 mg today; days 2-5 take 250 mg daily  Dispense: 6 tablet; Refill: 0  6. Constipation, unspecified constipation type - docusate sodium (COLACE) 100 MG capsule; Take 1 capsule (100 mg total) by mouth daily.  Dispense: 30 capsule; Refill: 1 Routine Health Maintenance:   Recommend monthly self breast exam Recommend carbohydrate modified diet   RTC: Follow up by phone with laboratory results. Follow up in 1 month for DMII and IBS    Sophee Mckimmy M, FNP  The patient was given clear instructions to  go to ER or return to medical center if symptoms do not improve, worsen or new problems develop. The patient verbalized understanding. Will notify patient with laboratory results.

## 2016-09-22 NOTE — Patient Instructions (Addendum)
Start Cymbalta 30 mg daily for chronic pain syndrome.  Tylenol #3 every 6 hours for moderate to severe pain.  Myofascial Pain Syndrome and Fibromyalgia Will continue medications for type 2 diabetes mellitus. Will follow up by phone with any abnormal lab results.  Introduction Myofascial pain syndrome and fibromyalgia are both pain disorders. This pain may be felt mainly in your muscles.  Myofascial pain syndrome:  Always has trigger points or tender points in the muscle that will cause pain when pressed. The pain may come and go.  Usually affects your neck, upper back, and shoulder areas. The pain often radiates into your arms and hands.  Fibromyalgia:  Has muscle pains and tenderness that come and go.  Is often associated with fatigue and sleep disturbances.  Has trigger points.  Tends to be long-lasting (chronic), but is not life-threatening. Fibromyalgia and myofascial pain are not the same. However, they often occur together. If you have both conditions, each can make the other worse. Both are common and can cause enough pain and fatigue to make day-to-day activities difficult. What are the causes? The exact causes of fibromyalgia and myofascial pain are not known. People with certain gene types may be more likely to develop fibromyalgia. Some factors can be triggers for both conditions, such as:  Spine disorders.  Arthritis.  Severe injury (trauma) and other physical stressors.  Being under a lot of stress.  A medical illness. What are the signs or symptoms? Fibromyalgia  The main symptom of fibromyalgia is widespread pain and tenderness in your muscles. This can vary over time. Pain is sometimes described as stabbing, shooting, or burning. You may have tingling or numbness, too. You may also have sleep problems and fatigue. You may wake up feeling tired and groggy (fibro fog). Other symptoms may include:  Bowel and bladder problems.  Headaches.  Visual  problems.  Problems with odors and noises.  Depression or mood changes.  Painful menstrual periods (dysmenorrhea).  Dry skin or eyes. Myofascial pain syndrome  Symptoms of myofascial pain syndrome include:  Tight, ropy bands of muscle.  Uncomfortable sensations in muscular areas, such as:  Aching.  Cramping.  Burning.  Numbness.  Tingling.  Muscle weakness.  Trouble moving certain muscles freely (range of motion). How is this diagnosed? There are no specific tests to diagnose fibromyalgia or myofascial pain syndrome. Both can be hard to diagnose because their symptoms are common in many other conditions. Your health care provider may suspect one or both of these conditions based on your symptoms and medical history. Your health care provider will also do a physical exam. The key to diagnosing fibromyalgia is having pain, fatigue, and other symptoms for more than three months that cannot be explained by another condition. The key to diagnosing myofascial pain syndrome is finding trigger points in muscles that are tender and cause pain elsewhere in your body (referred pain). How is this treated? Treating fibromyalgia and myofascial pain often requires a team of health care providers. This usually starts with your primary provider and a physical therapist. You may also find it helpful to work with alternative health care providers, such as massage therapists or acupuncturists. Treatment for fibromyalgia may include medicines. This may include nonsteroidal anti-inflammatory drugs (NSAIDs), along with other medicines. Treatment for myofascial pain may also include:  NSAIDs.  Cooling and stretching of muscles.  Trigger point injections.  Sound wave (ultrasound) treatments to stimulate muscles. Follow these instructions at home:  Take medicines only as directed by your  health care provider.  Exercise as directed by your health care provider or physical therapist.  Try to  avoid stressful situations.  Practice relaxation techniques to control your stress. You may want to try:  Biofeedback.  Visual imagery.  Hypnosis.  Muscle relaxation.  Yoga.  Meditation.  Talk to your health care provider about alternative treatments, such as acupuncture or massage treatment.  Maintain a healthy lifestyle. This includes eating a healthy diet and getting enough sleep.  Consider joining a support group.  Do not do activities that stress or strain your muscles. That includes repetitive motions and heavy lifting. Where to find more information:  National Fibromyalgia Association: www.fmaware.org  Arthritis Foundation: www.arthritis.org  American Chronic Pain Association: GumSearch.nlwww.theacpa.org/condition/myofascial-pain Contact a health care provider if:  You have new symptoms.  Your symptoms get worse.  You have side effects from your medicines.  You have trouble sleeping.  Your condition is causing depression or anxiety. This information is not intended to replace advice given to you by your health care provider. Make sure you discuss any questions you have with your health care provider. Document Released: 10/02/2005 Document Revised: 03/09/2016 Document Reviewed: 07/08/2014  2017 Elsevier

## 2016-09-23 LAB — HEMOGLOBIN A1C
HEMOGLOBIN A1C: 7.6 % — AB (ref <5.7)
MEAN PLASMA GLUCOSE: 171 mg/dL

## 2016-09-23 MED ORDER — LISINOPRIL 20 MG PO TABS: 20.0000 mg | ORAL_TABLET | Freq: Every day | ORAL | 5 refills | Status: DC

## 2016-09-23 MED ORDER — INSULIN LISPRO PROT & LISPRO (75-25 MIX) 100 UNIT/ML ~~LOC~~ SUSP: 50.0000 [IU] | Freq: Two times a day (BID) | SUBCUTANEOUS | 5 refills | Status: DC

## 2016-10-17 MED FILL — $HUMALOG MIX 75-25 KWIKPEN: (75-25) 100 | 32 days supply | Qty: 45 | Fill #5

## 2016-10-31 ENCOUNTER — Telehealth: Payer: Self-pay

## 2016-10-31 NOTE — Telephone Encounter (Signed)
Armeniahina,  Patient is asking about a referral for pain management. If this is appropriate can you put in referral order? Please advise. Thanks!

## 2016-11-02 ENCOUNTER — Other Ambulatory Visit: Payer: Self-pay | Admitting: Family Medicine

## 2016-11-02 DIAGNOSIS — R52 Pain, unspecified: Principal | ICD-10-CM

## 2016-11-02 DIAGNOSIS — G8929 Other chronic pain: Secondary | ICD-10-CM

## 2016-11-08 ENCOUNTER — Telehealth: Payer: Self-pay

## 2016-11-09 NOTE — Telephone Encounter (Signed)
Called, spoke with patient. Advised that referral for pain management was faxed into [facility] on Tuesday 11/07/2016 and that they would be contacting her if they accepted her as a patient. Thanks!

## 2016-11-15 MED FILL — $HUMALOG MIX 75-25 KWIKPEN: (75-25) 100 | 32 days supply | Qty: 45 | Fill #6

## 2016-11-20 ENCOUNTER — Other Ambulatory Visit: Payer: Self-pay

## 2016-11-20 ENCOUNTER — Ambulatory Visit (INDEPENDENT_AMBULATORY_CARE_PROVIDER_SITE_OTHER): Payer: Medicaid Other | Admitting: Family Medicine

## 2016-11-20 ENCOUNTER — Encounter: Payer: Self-pay | Admitting: Family Medicine

## 2016-11-20 VITALS — BP 166/73 | HR 101 | Temp 98.8°F | Resp 18 | Ht 64.0 in | Wt 269.4 lb

## 2016-11-20 DIAGNOSIS — G8929 Other chronic pain: Secondary | ICD-10-CM

## 2016-11-20 DIAGNOSIS — E119 Type 2 diabetes mellitus without complications: Secondary | ICD-10-CM | POA: Diagnosis not present

## 2016-11-20 DIAGNOSIS — I10 Essential (primary) hypertension: Secondary | ICD-10-CM

## 2016-11-20 DIAGNOSIS — M797 Fibromyalgia: Secondary | ICD-10-CM

## 2016-11-20 DIAGNOSIS — R053 Chronic cough: Secondary | ICD-10-CM

## 2016-11-20 DIAGNOSIS — R0981 Nasal congestion: Secondary | ICD-10-CM

## 2016-11-20 DIAGNOSIS — M25569 Pain in unspecified knee: Secondary | ICD-10-CM

## 2016-11-20 DIAGNOSIS — Z114 Encounter for screening for human immunodeficiency virus [HIV]: Secondary | ICD-10-CM

## 2016-11-20 DIAGNOSIS — R05 Cough: Secondary | ICD-10-CM

## 2016-11-20 DIAGNOSIS — Z794 Long term (current) use of insulin: Secondary | ICD-10-CM

## 2016-11-20 LAB — POCT URINALYSIS DIP (DEVICE)
Bilirubin Urine: NEGATIVE
Glucose, UA: 500 mg/dL — AB
HGB URINE DIPSTICK: NEGATIVE
Ketones, ur: NEGATIVE mg/dL
Leukocytes, UA: NEGATIVE
NITRITE: NEGATIVE
PH: 5.5 (ref 5.0–8.0)
Protein, ur: 30 mg/dL — AB
Specific Gravity, Urine: 1.025 (ref 1.005–1.030)
UROBILINOGEN UA: 0.2 mg/dL (ref 0.0–1.0)

## 2016-11-20 LAB — BASIC METABOLIC PANEL
BUN: 9 mg/dL (ref 7–25)
CALCIUM: 8.6 mg/dL (ref 8.6–10.2)
CHLORIDE: 107 mmol/L (ref 98–110)
CO2: 25 mmol/L (ref 20–31)
CREATININE: 0.9 mg/dL (ref 0.50–1.10)
GLUCOSE: 260 mg/dL — AB (ref 65–99)
Potassium: 4.3 mmol/L (ref 3.5–5.3)
SODIUM: 139 mmol/L (ref 135–146)

## 2016-11-20 LAB — GLUCOSE, CAPILLARY: Glucose-Capillary: 287 mg/dL — ABNORMAL HIGH (ref 65–99)

## 2016-11-20 MED ORDER — FLUTICASONE PROPIONATE 50 MCG/ACT NA SUSP: 2.0000 | Freq: Every day | NASAL | 6 refills | Status: DC

## 2016-11-20 MED ORDER — GLUCOSE BLOOD VI STRP: ORAL_STRIP | 12 refills | Status: DC

## 2016-11-20 MED ORDER — HYDROCHLOROTHIAZIDE 25 MG PO TABS: 25.0000 mg | ORAL_TABLET | Freq: Every day | ORAL | 5 refills | Status: DC

## 2016-11-20 MED ORDER — DULOXETINE HCL 30 MG PO CPEP: 30.0000 mg | ORAL_CAPSULE | Freq: Every day | ORAL | 5 refills | Status: DC

## 2016-11-20 MED ORDER — AMLODIPINE BESYLATE 5 MG PO TABS: 5.0000 mg | ORAL_TABLET | Freq: Every day | ORAL | 3 refills | Status: DC

## 2016-11-20 NOTE — Progress Notes (Signed)
Subjective:    Patient ID: Jill Shaw, female    DOB: 25-May-1976, 41 y.o.   MRN: [id number]  HPI  [name], a 41 year old patient presents complaining of generalized pain and for a follow up of type 2 diabetes mellitus and hypertension. She states that she has been checking blood sugars inconsistently over the past several months.  She last had insulin 2 days ago. She said that she has not been eating, so she did not take insulin to avoid hypoglycemia. She says that she typically has seizures with hypoglycemia. . Blood sugar was 287 on arrival. She denies current symptoms of hyperglycemia.  Patient denies foot ulcerations, increase appetite, nausea, polydipsia, polyuria, visual disturbances, vomitting and weight loss.   She also has a history of hypertension. She is not exercising or following a lowfat, low sodium diet. She also does not check blood pressures at home.   Patient denies chest pain, fatigue, lower extremity edema, orthopnea and palpitations.  Cardiovascular risk factors: obesity (BMI >= 30 kg/m2), sedentary lifestyle and smoking/ tobacco exposure.   Patient is also complaining of a persistent cough over the past year. She has been taking Lisinopril for hypertension on and off over the past 12 years. She says that cough is non productive. She denies shortness of breath and fever.   . She has a history of fibromyalgia.She says that knee pain has been controlled on hydrocodone in the past. She was previously followed by a pain clinic. She has been out of medication over the past several months. She has not attempted any OTC interventions to alleviate symptoms. She says that current pain intensity is 10/10. Knee pain is aggravated by standing, bending, transitioning from sitting to standing, and pivoting movements.   Past Medical History:  Diagnosis Date  . Arthritis   . Diabetes mellitus without complication (HCC)   . Hypertension   . Sickle cell trait (HCC)     Immunization History  Administered Date(s) Administered  . Pneumococcal Polysaccharide-23 03/28/2015  . Tdap 03/27/2016   Past Surgical History:  Procedure Laterality Date  . CESAREAN SECTION     Social History   Social History  . Marital status: Single    Spouse name: N/A  . Number of children: N/A  . Years of education: N/A   Occupational History  . Not on file.   Social History Main Topics  . Smoking status: Current Every Day Smoker    Packs/day: 0.50    Types: Cigarettes  . Smokeless tobacco: Never Used  . Alcohol use No  . Drug use: No  . Sexual activity: Not on file   Other Topics Concern  . Not on file   Social History Narrative  . No narrative on file       Review of Systems  Constitutional: Positive for fatigue. Negative for fever and unexpected weight change.  HENT: Positive for postnasal drip and rhinorrhea. Negative for congestion and dental problem.   Eyes: Negative.  Negative for photophobia and visual disturbance.  Respiratory: Positive for cough.   Cardiovascular: Negative.   Gastrointestinal: Positive for constipation. Negative for rectal pain and vomiting.  Endocrine: Negative.  Negative for polydipsia, polyphagia and polyuria.  Genitourinary: Negative.  Negative for dyspareunia and dysuria.  Musculoskeletal: Positive for arthralgias, back pain and myalgias (Bilateral knee pain).  Skin: Negative.   Allergic/Immunologic: Negative.  Negative for immunocompromised state.  Neurological: Negative.   Hematological: Negative.   Psychiatric/Behavioral: Negative.  Negative for sleep disturbance and suicidal  ideas.       Objective:   Physical Exam  Constitutional: She is oriented to person, place, and time. She appears well-developed and well-nourished.  HENT:  Head: Normocephalic and atraumatic.  Right Ear: External ear normal.  Left Ear: External ear normal.  Nose: Mucosal edema present.  Mouth/Throat: Posterior oropharyngeal edema present.   Eyes: Conjunctivae and EOM are normal. Pupils are equal, round, and reactive to light.  Neck: Normal range of motion. Neck supple.  Cardiovascular: Normal rate, regular rhythm, normal heart sounds and intact distal pulses.   Pulmonary/Chest: Effort normal and breath sounds normal.  Abdominal: Soft. Bowel sounds are normal. There is generalized tenderness.  Increased abdominal girth  Musculoskeletal:       Right knee: She exhibits decreased range of motion. She exhibits no swelling.       Left knee: She exhibits decreased range of motion. She exhibits no swelling.  Neurological: She is alert and oriented to person, place, and time. She has normal reflexes.  Skin: Skin is warm and dry.  Psychiatric: She has a normal mood and affect. Her behavior is normal. Judgment and thought content normal.      BP (!) 166/73 (BP Location: Left Arm, Patient Position: Sitting, Cuff Size: Large)   Pulse (!) 101   Temp 98.8 F (37.1 C)   Resp 18   Ht 5\' 4"  (1.626 m)   Wt 269 lb 6.4 oz (122.2 kg)   SpO2 96%   BMI 46.24 kg/m  Assessment & Plan:   1. Chronic cough Will hold Lisinopril due to persistent cough. Also, discussed smoking cessation at length. Will re assess in 2 months.   2. Type 2 diabetes mellitus without complication, with long-term current use of insulin (HCC) Previous hemoglobin a1C is 7.6. There have been some  compliance issues here. I have discussed with her the great importance of following the treatment plan exactly as directed in order to achieve a good medical outcome. - Ambulatory referral to diabetic education - Ambulatory referral to Ophthalmology - Glucose (CBG) - Basic Metabolic Panel  3. Essential hypertension The patient is asked to make an attempt to improve diet and exercise patterns to aid in medical management of this problem. - amLODipine (NORVASC) 5 MG tablet; Take 1 tablet (5 mg total) by mouth daily.  Dispense: 90 tablet; Refill: 3 - hydrochlorothiazide  (HYDRODIURIL) 25 MG tablet; Take 1 tablet (25 mg total) by mouth daily.  Dispense: 30 tablet; Refill: 5 - Basic Metabolic Panel  4. Nasal congestion - fluticasone (FLONASE) 50 MCG/ACT nasal spray; Place 2 sprays into both nostrils daily.  Dispense: 16 g; Refill: 6  5. Chronic knee pain, unspecified laterality Previous referral sent to pain management on 11/02/2016  6. Fibromyalgia  - DULoxetine (CYMBALTA) 30 MG capsule; Take 1 capsule (30 mg total) by mouth daily.  Dispense: 30 capsule; Refill: 5  7. Screening for HIV (human immunodeficiency virus)  - HIV antibody (with reflex)    RTC: 1 month for DMII and hypertension  Leanna Hamid M, FNP  The patient was given clear instructions to go to ER or return to medical center if symptoms do not improve, worsen or new problems develop. The patient verbalized understanding. Will notify patient with laboratory results.

## 2016-11-21 ENCOUNTER — Other Ambulatory Visit: Payer: Self-pay

## 2016-11-21 ENCOUNTER — Telehealth: Payer: Self-pay

## 2016-11-21 DIAGNOSIS — I10 Essential (primary) hypertension: Secondary | ICD-10-CM

## 2016-11-21 DIAGNOSIS — G8929 Other chronic pain: Secondary | ICD-10-CM

## 2016-11-21 DIAGNOSIS — M25569 Pain in unspecified knee: Secondary | ICD-10-CM

## 2016-11-21 DIAGNOSIS — M797 Fibromyalgia: Secondary | ICD-10-CM

## 2016-11-21 DIAGNOSIS — R0981 Nasal congestion: Secondary | ICD-10-CM

## 2016-11-21 LAB — HIV ANTIBODY (ROUTINE TESTING W REFLEX): HIV 1&2 Ab, 4th Generation: NONREACTIVE

## 2016-11-21 MED ORDER — GLUCOSE BLOOD VI STRP: ORAL_STRIP | 12 refills | Status: DC

## 2016-11-21 MED ORDER — AMLODIPINE BESYLATE 5 MG PO TABS: 5.0000 mg | ORAL_TABLET | Freq: Every day | ORAL | 3 refills | Status: DC

## 2016-11-21 MED ORDER — FLUTICASONE PROPIONATE 50 MCG/ACT NA SUSP: 2.0000 | Freq: Every day | NASAL | 6 refills | Status: DC

## 2016-11-21 MED ORDER — HYDROCHLOROTHIAZIDE 25 MG PO TABS: 25.0000 mg | ORAL_TABLET | Freq: Every day | ORAL | 5 refills | Status: DC

## 2016-11-21 MED ORDER — DULOXETINE HCL 30 MG PO CPEP: 30.0000 mg | ORAL_CAPSULE | Freq: Every day | ORAL | 5 refills | Status: DC

## 2016-11-21 MED FILL — ?AMLODIPINE BESYLATE 5 MG T: 5 | 30 days supply | Qty: 30 | Fill #0

## 2016-11-21 MED FILL — HYDROCHLOROTHIAZIDE 25 MG T: 25 | 30 days supply | Qty: 30 | Fill #0

## 2016-11-21 MED FILL — FLUTICASONE PROP 50 MCG SPR: 50 | 30 days supply | Qty: 16 | Fill #0

## 2016-11-21 MED FILL — DULoxetine HCL 30 MG CPEP: 30 | 30 days supply | Qty: 30 | Fill #0

## 2016-11-21 NOTE — Telephone Encounter (Signed)
Medications have  [facility]. Thanks!

## 2016-12-06 ENCOUNTER — Other Ambulatory Visit: Payer: Self-pay

## 2016-12-06 DIAGNOSIS — Z794 Long term (current) use of insulin: Principal | ICD-10-CM

## 2016-12-06 DIAGNOSIS — E119 Type 2 diabetes mellitus without complications: Secondary | ICD-10-CM

## 2016-12-06 MED ORDER — TRUE METRIX METER W/DEVICE KIT: 1.0000 | PACK | Freq: Three times a day (TID) | 0 refills | Status: DC

## 2016-12-06 MED ORDER — GLUCOSE BLOOD VI STRP: ORAL_STRIP | 12 refills | Status: DC

## 2016-12-13 MED FILL — $HUMALOG MIX 75-25 KWIKPEN: (75-25) 100 | 32 days supply | Qty: 45 | Fill #7

## 2017-01-15 MED FILL — $HUMALOG MIX 75-25 KWIKPEN: (75-25) 100 | 32 days supply | Qty: 45 | Fill #8

## 2017-01-15 MED FILL — OMEPRAZOLE DR 40 MG CAPSULE: 40 | 30 days supply | Qty: 30 | Fill #0

## 2017-01-18 ENCOUNTER — Ambulatory Visit: Payer: Medicaid - Out of State | Admitting: Family Medicine

## 2017-02-14 MED FILL — ?LISINOPRIL 20 MG TABLET: 20 | 30 days supply | Qty: 30 | Fill #1

## 2017-02-14 MED FILL — HYDROCHLOROTHIAZIDE 25 MG T: 25 | 30 days supply | Qty: 30 | Fill #1

## 2017-02-14 MED FILL — $HUMALOG MIX 75-25 KWIKPEN: (75-25) 100 | 32 days supply | Qty: 45 | Fill #9

## 2017-03-09 ENCOUNTER — Other Ambulatory Visit: Payer: Self-pay | Admitting: Family Medicine

## 2017-03-09 ENCOUNTER — Ambulatory Visit (INDEPENDENT_AMBULATORY_CARE_PROVIDER_SITE_OTHER): Payer: Medicaid Other | Admitting: Family Medicine

## 2017-03-09 ENCOUNTER — Encounter: Payer: Self-pay | Admitting: Family Medicine

## 2017-03-09 VITALS — BP 160/68 | HR 96 | Temp 98.5°F | Resp 18 | Ht 64.0 in | Wt 269.0 lb

## 2017-03-09 DIAGNOSIS — R51 Headache: Secondary | ICD-10-CM

## 2017-03-09 DIAGNOSIS — E119 Type 2 diabetes mellitus without complications: Secondary | ICD-10-CM | POA: Diagnosis not present

## 2017-03-09 DIAGNOSIS — I10 Essential (primary) hypertension: Secondary | ICD-10-CM | POA: Diagnosis not present

## 2017-03-09 DIAGNOSIS — R42 Dizziness and giddiness: Secondary | ICD-10-CM | POA: Diagnosis not present

## 2017-03-09 DIAGNOSIS — Z794 Long term (current) use of insulin: Secondary | ICD-10-CM | POA: Diagnosis not present

## 2017-03-09 DIAGNOSIS — N764 Abscess of vulva: Secondary | ICD-10-CM

## 2017-03-09 DIAGNOSIS — R519 Headache, unspecified: Secondary | ICD-10-CM

## 2017-03-09 LAB — GLUCOSE, CAPILLARY: GLUCOSE-CAPILLARY: 88 mg/dL (ref 65–99)

## 2017-03-09 LAB — CBC WITH DIFFERENTIAL/PLATELET
BASOS PCT: 1 %
Basophils Absolute: 105 cells/uL (ref 0–200)
EOS ABS: 315 {cells}/uL (ref 15–500)
Eosinophils Relative: 3 %
HEMATOCRIT: 31.6 % — AB (ref 35.0–45.0)
Hemoglobin: 9.2 g/dL — ABNORMAL LOW (ref 11.7–15.5)
LYMPHS ABS: 2415 {cells}/uL (ref 850–3900)
Lymphocytes Relative: 23 %
MCH: 20.2 pg — ABNORMAL LOW (ref 27.0–33.0)
MCHC: 29.1 g/dL — ABNORMAL LOW (ref 32.0–36.0)
MCV: 69.3 fL — AB (ref 80.0–100.0)
Monocytes Absolute: 735 cells/uL (ref 200–950)
Monocytes Relative: 7 %
NEUTROS ABS: 6930 {cells}/uL (ref 1500–7800)
Neutrophils Relative %: 66 %
Platelets: 286 10*3/uL (ref 140–400)
RBC: 4.56 MIL/uL (ref 3.80–5.10)
RDW: 22.2 % — ABNORMAL HIGH (ref 11.0–15.0)
WBC: 10.5 10*3/uL (ref 3.8–10.8)

## 2017-03-09 LAB — POCT GLYCOSYLATED HEMOGLOBIN (HGB A1C): HEMOGLOBIN A1C: 7.4

## 2017-03-09 LAB — POCT URINALYSIS DIP (DEVICE)
Bilirubin Urine: NEGATIVE
Bilirubin Urine: NEGATIVE
GLUCOSE, UA: NEGATIVE mg/dL
GLUCOSE, UA: NEGATIVE mg/dL
Hgb urine dipstick: NEGATIVE
Hgb urine dipstick: NEGATIVE
Ketones, ur: NEGATIVE mg/dL
Ketones, ur: NEGATIVE mg/dL
NITRITE: NEGATIVE
NITRITE: NEGATIVE
PH: 6.5 (ref 5.0–8.0)
PH: 7 (ref 5.0–8.0)
PROTEIN: NEGATIVE mg/dL
PROTEIN: NEGATIVE mg/dL
Specific Gravity, Urine: 1.015 (ref 1.005–1.030)
Specific Gravity, Urine: 1.02 (ref 1.005–1.030)
UROBILINOGEN UA: 0.2 mg/dL (ref 0.0–1.0)
UROBILINOGEN UA: 0.2 mg/dL (ref 0.0–1.0)

## 2017-03-09 MED ORDER — INSULIN LISPRO PROT & LISPRO (75-25 MIX) 100 UNIT/ML ~~LOC~~ SUSP: 50.0000 [IU] | Freq: Two times a day (BID) | SUBCUTANEOUS | 5 refills | Status: DC

## 2017-03-09 MED ORDER — INSULIN LISPRO PROT & LISPRO (75-25 MIX) 100 UNIT/ML ~~LOC~~ SUSP: 70.0000 [IU] | Freq: Two times a day (BID) | SUBCUTANEOUS | 5 refills | Status: DC

## 2017-03-09 MED ORDER — HYDROCHLOROTHIAZIDE 25 MG PO TABS: 25.0000 mg | ORAL_TABLET | Freq: Every day | ORAL | 2 refills | Status: DC

## 2017-03-09 MED ORDER — PREGABALIN 75 MG PO CAPS: 75.0000 mg | ORAL_CAPSULE | Freq: Two times a day (BID) | ORAL | 0 refills | Status: DC

## 2017-03-09 MED ORDER — POTASSIUM CHLORIDE ER 10 MEQ PO TBCR: 10.0000 meq | EXTENDED_RELEASE_TABLET | Freq: Every day | ORAL | 0 refills | Status: DC

## 2017-03-09 MED ORDER — CEPHALEXIN 750 MG PO CAPS: 750.0000 mg | ORAL_CAPSULE | Freq: Three times a day (TID) | ORAL | 0 refills | Status: DC

## 2017-03-09 MED ORDER — LOSARTAN POTASSIUM 50 MG PO TABS: 50.0000 mg | ORAL_TABLET | Freq: Every day | ORAL | 3 refills | Status: DC

## 2017-03-09 MED ORDER — FUROSEMIDE 20 MG PO TABS: 20.0000 mg | ORAL_TABLET | Freq: Two times a day (BID) | ORAL | 0 refills | Status: DC | PRN

## 2017-03-09 MED ORDER — DOXYCYCLINE HYCLATE 100 MG PO CAPS: 100.0000 mg | ORAL_CAPSULE | Freq: Two times a day (BID) | ORAL | 0 refills | Status: DC

## 2017-03-09 MED ORDER — SAXAGLIPTIN HCL 2.5 MG PO TABS: 2.5000 mg | ORAL_TABLET | Freq: Every day | ORAL | 1 refills | Status: DC

## 2017-03-09 MED FILL — $HUMALOG MIX 75-25 KWIKPEN: (75-25) 100 | 32 days supply | Qty: 45 | Fill #10

## 2017-03-09 MED FILL — ?FUROSEMIDE 20 MG TABLET: 20 | 15 days supply | Qty: 30 | Fill #0

## 2017-03-09 MED FILL — CEPHALEXIN 250 MG CAPSULE: 250 | 10 days supply | Qty: 90 | Fill #0

## 2017-03-09 MED FILL — LOSARTAN POTASSIUM 50 MG TA: 50 | 30 days supply | Qty: 30 | Fill #0

## 2017-03-09 MED FILL — ?POTASSIUM CL ER 10 MEQ TAB: 10 MEQ | 30 days supply | Qty: 30 | Fill #0

## 2017-03-09 MED FILL — ONGLYZA 2.5 MG TABLET: 2.5 | 30 days supply | Qty: 30 | Fill #0

## 2017-03-09 MED FILL — HYDROCHLOROTHIAZIDE 25 MG T: 25 | 30 days supply | Qty: 30 | Fill #0

## 2017-03-09 MED FILL — ?DOXYCYCLINE HYCLATE 100 MG: 100 | 10 days supply | Qty: 20 | Fill #0

## 2017-03-09 NOTE — Progress Notes (Signed)
 Patient ID: Jill Shaw, female    DOB: 12/07/1975, 41 y.o.   MRN: [id number]  PCP: Hollis, Lachina M, FNP  Chief Complaint  Patient presents with  . Hypertension  . Diabetes  . Peripheral Neuropathy    left foot is "burning her"   . Shoulder Pain    stiffiness in both shoulders     Subjective:  HPI  Jill Shaw is a 41 y.o. female presents for chronic disease management. Medical problems include: Hypertension, T2DM, Tobacco Use, IBS, Morbid Obesity,  Chronic knee pain.  Hypertension Her mother has recently starting checking her blood pressure at home and it has been consistently  Elevated over the last several weeks. Uncertain of numerical values of readings. Reports associated Unilateral headaches which are present upon awakening. She hasn't attempted relief of headaches with  Medication as she was uncertain of what to take. Denies chest pain. She reports lower extremity swelling worsening. At times she is unable to put her socks and shoes on. Medication regimen includes amlodipine and hydrochlorothiazide and she reports compliance.  Neuropathic Symptoms  Right leg and arm numbness is occurring more frequently. She reports that these symptoms are new. Reports recent dizziness with walking up an down stairs. Tingling sensation in arms, legs, and feet present Intermittently. She was previously trialed on Cymbalta and Gabapentin without relief of symptoms.  Diabetes, insulin dependent  Last A1C 7.6. Reports administering 70 units, twice daily of Humalog 75/25. Checks blood sugar although did not bring a log of readings today. Denies hypoglycemia. Was previously on Metformin several years ago , did not tolerate due to GI symptoms.   Vaginal Absecss Abscess present for several weeks and has increased in diameter. Reports that she often develops vaginal abscess when her blood sugar is not managed appropriately. Boil is painless and hard. Recently has began to itch. Denies  apply any topical medication and or heat applications to mass.   Social History   Social History  . Marital status: Single    Spouse name: N/A  . Number of children: N/A  . Years of education: N/A   Occupational History  . Not on file.   Social History Main Topics  . Smoking status: Current Every Day Smoker    Packs/day: 0.50    Types: Cigarettes  . Smokeless tobacco: Never Used  . Alcohol use No  . Drug use: No  . Sexual activity: Not on file   Other Topics Concern  . Not on file   Social History Narrative  . No narrative on file   History reviewed. No pertinent family history. Review of Systems See HPI  Patient Active Problem List   Diagnosis Date Noted  . Knee pain, chronic 03/27/2016  . Type 2 diabetes mellitus without complication, with long-term current use of insulin (HCC) 03/27/2016  . Essential hypertension 03/27/2016  . Morbid obesity (HCC) 03/27/2016  . Irritable bowel syndrome with constipation 03/27/2016  . Tobacco dependence 03/27/2016    Allergies  Allergen Reactions  . Ibuprofen Nausea Only    Prior to Admission medications   Medication Sig Start Date End Date Taking? Authorizing Provider  Blood Glucose Monitoring Suppl (TRUE METRIX METER) w/Device KIT 1 each by Does not apply route 4 (four) times daily -  before meals and at bedtime. 12/06/16  Yes Hollis, Lachina M, FNP  docusate sodium (COLACE) 100 MG capsule Take 1 capsule (100 mg total) by mouth daily. 09/22/16  Yes Hollis, Lachina M, FNP  fluticasone (FLONASE) 50 MCG/ACT nasal   spray Place 2 sprays into both nostrils daily. 11/21/16  Yes Dorena Dew, FNP  glucose blood (FREESTYLE TEST STRIPS) test strip Use as instructed 11/21/16  Yes Dorena Dew, FNP  glucose blood (TRUE METRIX BLOOD GLUCOSE TEST) test strip Use as instructed 12/06/16  Yes Dorena Dew, FNP  hydrochlorothiazide (HYDRODIURIL) 25 MG tablet Take 1 tablet (25 mg total) by mouth daily. 11/21/16  Yes Dorena Dew, FNP   insulin lispro protamine-lispro (HUMALOG 75/25 MIX) (75-25) 100 UNIT/ML SUSP injection Inject 50 Units into the skin 2 (two) times daily with a meal. 09/23/16  Yes Dorena Dew, FNP  omeprazole (PRILOSEC) 40 MG capsule Take 1 capsule (40 mg total) by mouth daily. 04/11/16  Yes Dorena Dew, FNP  amLODipine (NORVASC) 5 MG tablet Take 1 tablet (5 mg total) by mouth daily. 11/21/16   Dorena Dew, FNP  DULoxetine (CYMBALTA) 30 MG capsule Take 1 capsule (30 mg total) by mouth daily. Patient not taking: Reported on 03/09/2017 11/21/16   Dorena Dew, FNP  tizanidine (ZANAFLEX) 6 MG capsule Take 6 mg by mouth 3 (three) times daily.    [provider]    Past Medical, Surgical Family and Social History reviewed and updated.    Objective:   Today's Vitals   03/09/17 1352 03/09/17 1528  BP: (!) 164/72 (!) 160/68  Pulse: 96   Resp: 18   Temp: 98.5 F (36.9 C)   TempSrc: Oral   SpO2: 100%   Weight: 269 lb (122 kg)   Height: 5' 4" (1.626 m)   PainSc: 8      Wt Readings from Last 3 Encounters:  03/09/17 269 lb (122 kg)  11/20/16 269 lb 6.4 oz (122.2 kg)  09/22/16 266 lb (120.7 kg)    Physical Exam  Constitutional: She is oriented to person, place, and time. She appears well-developed and well-nourished.  Eyes: Conjunctivae and EOM are normal. Pupils are equal, round, and reactive to light.  Neck: Neck supple. No thyromegaly present.  Cardiovascular: Normal rate, regular rhythm, normal heart sounds and intact distal pulses.   Pulmonary/Chest: Effort normal and breath sounds normal.  Abdominal: Soft. Bowel sounds are normal. She exhibits no distension. There is no tenderness. There is no rebound and no guarding.  Genitourinary:     Musculoskeletal: Normal range of motion.  Neurological: She is alert and oriented to person, place, and time.  Equal bilateral hand grips. Negative of tremors. Gait is well coordinated. Cerebellar function intact.   Skin: Skin is  warm and dry.  Psychiatric: She has a normal mood and affect. Her behavior is normal. Judgment and thought content normal.    Assessment & Plan:  1. Essential hypertension - CBC with Differential/Platelet - Lipid panel - Thyroid Panel With TSH - hydrochlorothiazide (HYDRODIURIL) 25 MG tablet; Take 1 tablet (25 mg total) by mouth daily.  Dispense: 90 tablet; Refill: 2 -discontinue Amlodipine due to fluid retention.  -Start Losartan 50 mg daily. Will titrate up if blood pressure is not at goal of <140/90.   2. Type 2 diabetes mellitus without complication, with long-term current use of insulin (HCC) - HgB A1c-improved 7.4 - COMPLETE METABOLIC PANEL WITH GFR - insulin lispro protamine-lispro (HUMALOG 75/25 MIX) (75-25) 100 UNIT/ML SUSP injection; Inject 70 Units into the skin 2 (two) times daily with a meal.  Dispense: 10 mL; Refill: 5( Recommended that she could decrease units of insulin,patient feels that numbers are controlled with 70 units twice daily with meals.  No changes in for now in therapy. -Added saxgliptin 2.5 mg once daily. If she tolerates and A1C improves consider switchin to  -If A1C remains stable, patient is able to tolerate oral antidiabetic medication, will consider discontinuing Humalog and  adding  Lantus at bedtime only  3. Dizziness - Ambulatory referral to Neurology  4. Worsening headaches - Ambulatory referral to Neurology -Attempt relief of headaches with ibuprofen or Tylenol   5. Abscess of labia majora -Keflex 750 mg TID for suspected infected abscess.     RTC: 1 week to evaluate vaginal abscess and blood pressure. 3 months for diabetes and hypertension follow-up.  Carroll Sage. Kenton Kingfisher, MSN, FNP-C [facility]  [address] [place], [place] [address] [phone]

## 2017-03-09 NOTE — Patient Instructions (Addendum)
I have updated your insulin to reflect 70 units twice daily with meals.  I have changed your blood pressure medication to Losartan 50 mg once daily and continue Hydrochlorothiazide 25 mg.  I would like for you take 1 dose of potassium 10 meq with each dose of Lasix to prevent loss of electrolytes.   For nerve pain, I am trying you on Lyrica 75 mg twice daily for pain.  I am referring you to neurology for ongoing nerve pain, numbness of right leg w/ headaches.  They will contact you regarding your appointment.

## 2017-03-10 LAB — COMPLETE METABOLIC PANEL WITH GFR
ALK PHOS: 71 U/L (ref 33–115)
ALT: 31 U/L — ABNORMAL HIGH (ref 6–29)
AST: 67 U/L — AB (ref 10–30)
Albumin: 3.9 g/dL (ref 3.6–5.1)
BUN: 10 mg/dL (ref 7–25)
CALCIUM: 9.4 mg/dL (ref 8.6–10.2)
CHLORIDE: 105 mmol/L (ref 98–110)
CO2: 23 mmol/L (ref 20–31)
Creat: 0.94 mg/dL (ref 0.50–1.10)
GFR, EST AFRICAN AMERICAN: 88 mL/min (ref >=60)
GFR, Est Non African American: 76 mL/min (ref >=60)
Glucose, Bld: 55 mg/dL — ABNORMAL LOW (ref 65–99)
Potassium: 3.9 mmol/L (ref 3.5–5.3)
Sodium: 140 mmol/L (ref 135–146)
Total Bilirubin: 0.3 mg/dL (ref 0.2–1.2)
Total Protein: 6.8 g/dL (ref 6.1–8.1)

## 2017-03-10 LAB — LIPID PANEL
Cholesterol: 136 mg/dL (ref <200)
HDL: 55 mg/dL (ref >50)
LDL CALC: 52 mg/dL (ref <100)
TRIGLYCERIDES: 144 mg/dL (ref <150)
Total CHOL/HDL Ratio: 2.5 Ratio (ref <5.0)
VLDL: 29 mg/dL (ref <30)

## 2017-03-10 LAB — THYROID PANEL WITH TSH
Free Thyroxine Index: 2.1 (ref 1.4–3.8)
T3 Uptake: 27 % (ref 22–35)
T4 TOTAL: 7.9 ug/dL (ref 4.5–12.0)
TSH: 1 mIU/L

## 2017-03-13 LAB — IRON AND TIBC
%SAT: 4 % — ABNORMAL LOW (ref 11–50)
IRON: 24 ug/dL — AB (ref 40–190)
TIBC: 577 ug/dL — AB (ref 250–450)
UIBC: 553 ug/dL

## 2017-03-16 ENCOUNTER — Ambulatory Visit: Payer: Medicaid - Out of State | Admitting: Family Medicine

## 2017-03-19 MED ORDER — IRON 325 (65 FE) MG PO TABS: 325.0000 mg | ORAL_TABLET | Freq: Every day | ORAL | 0 refills | Status: DC

## 2017-03-19 MED FILL — FERROUS SULFATE 325 MG TAB: 325 (65 FE) | 30 days supply | Qty: 30 | Fill #0

## 2017-03-19 NOTE — Addendum Note (Signed)
Addended by: Bing NeighborsHARRIS, Taj Arteaga S on: 03/19/2017 08:46 AM   Modules accepted: Orders

## 2017-03-19 NOTE — Progress Notes (Signed)
E-prescribed iron replacement therapy

## 2017-03-30 ENCOUNTER — Ambulatory Visit (INDEPENDENT_AMBULATORY_CARE_PROVIDER_SITE_OTHER): Payer: Medicaid Other | Admitting: Family Medicine

## 2017-03-30 ENCOUNTER — Encounter: Payer: Self-pay | Admitting: Family Medicine

## 2017-03-30 ENCOUNTER — Other Ambulatory Visit: Payer: Self-pay | Admitting: Internal Medicine

## 2017-03-30 ENCOUNTER — Other Ambulatory Visit: Payer: Self-pay | Admitting: Family Medicine

## 2017-03-30 VITALS — BP 110/49 | HR 83 | Temp 97.8°F | Resp 18 | Ht 64.0 in | Wt 273.0 lb

## 2017-03-30 DIAGNOSIS — I10 Essential (primary) hypertension: Secondary | ICD-10-CM | POA: Diagnosis not present

## 2017-03-30 DIAGNOSIS — F17200 Nicotine dependence, unspecified, uncomplicated: Secondary | ICD-10-CM | POA: Diagnosis not present

## 2017-03-30 DIAGNOSIS — E119 Type 2 diabetes mellitus without complications: Secondary | ICD-10-CM | POA: Diagnosis not present

## 2017-03-30 DIAGNOSIS — Z794 Long term (current) use of insulin: Secondary | ICD-10-CM | POA: Diagnosis not present

## 2017-03-30 DIAGNOSIS — J42 Unspecified chronic bronchitis: Secondary | ICD-10-CM | POA: Diagnosis not present

## 2017-03-30 DIAGNOSIS — R600 Localized edema: Secondary | ICD-10-CM | POA: Diagnosis not present

## 2017-03-30 DIAGNOSIS — R42 Dizziness and giddiness: Secondary | ICD-10-CM

## 2017-03-30 DIAGNOSIS — R0600 Dyspnea, unspecified: Secondary | ICD-10-CM

## 2017-03-30 LAB — COMPLETE METABOLIC PANEL WITH GFR
ALBUMIN: 3.7 g/dL (ref 3.6–5.1)
ALT: 33 U/L — AB (ref 6–29)
AST: 68 U/L — ABNORMAL HIGH (ref 10–30)
Alkaline Phosphatase: 66 U/L (ref 33–115)
BILIRUBIN TOTAL: 0.5 mg/dL (ref 0.2–1.2)
BUN: 10 mg/dL (ref 7–25)
CALCIUM: 9 mg/dL (ref 8.6–10.2)
CO2: 26 mmol/L (ref 20–31)
CREATININE: 1.02 mg/dL (ref 0.50–1.10)
Chloride: 106 mmol/L (ref 98–110)
GFR, EST AFRICAN AMERICAN: 80 mL/min (ref >=60)
GFR, Est Non African American: 69 mL/min (ref >=60)
Glucose, Bld: 119 mg/dL — ABNORMAL HIGH (ref 65–99)
Potassium: 4.1 mmol/L (ref 3.5–5.3)
Sodium: 138 mmol/L (ref 135–146)
TOTAL PROTEIN: 5.9 g/dL — AB (ref 6.1–8.1)

## 2017-03-30 LAB — CBC WITH DIFFERENTIAL/PLATELET
Basophils Absolute: 76 cells/uL (ref 0–200)
Basophils Relative: 1 %
Eosinophils Absolute: 228 cells/uL (ref 15–500)
Eosinophils Relative: 3 %
HCT: 28.2 % — ABNORMAL LOW (ref 35.0–45.0)
Hemoglobin: 8.2 g/dL — ABNORMAL LOW (ref 11.7–15.5)
Lymphocytes Relative: 20 %
Lymphs Abs: 1520 cells/uL (ref 850–3900)
MCH: 20 pg — AB (ref 27.0–33.0)
MCHC: 29.1 g/dL — ABNORMAL LOW (ref 32.0–36.0)
MCV: 68.8 fL — ABNORMAL LOW (ref 80.0–100.0)
MONOS PCT: 7 %
Monocytes Absolute: 532 cells/uL (ref 200–950)
NEUTROS ABS: 5244 {cells}/uL (ref 1500–7800)
Neutrophils Relative %: 69 %
PLATELETS: 242 10*3/uL (ref 140–400)
RBC: 4.1 MIL/uL (ref 3.80–5.10)
RDW: 22 % — AB (ref 11.0–15.0)
WBC: 7.6 10*3/uL (ref 3.8–10.8)

## 2017-03-30 LAB — POCT GLYCOSYLATED HEMOGLOBIN (HGB A1C): Hemoglobin A1C: 6.7

## 2017-03-30 LAB — GLUCOSE, CAPILLARY: GLUCOSE-CAPILLARY: 152 mg/dL — AB (ref 65–99)

## 2017-03-30 MED ORDER — INSULIN LISPRO PROT & LISPRO (75-25 MIX) 100 UNIT/ML ~~LOC~~ SUSP: 70.0000 [IU] | Freq: Two times a day (BID) | SUBCUTANEOUS | 5 refills | Status: DC

## 2017-03-30 MED FILL — $HUMALOG MIX 75-25 KWIKPEN: (75-25) 100 | 30 days supply | Qty: 45 | Fill #0

## 2017-03-30 NOTE — Patient Instructions (Addendum)
Daily weights in am without clothing (prior to eating) Will schedule  an echocardiogram Will discontinue hydrochlorothiazide 25 mg.  Continue Furosemide 20 mg twice daily Titrate smoking further  Will follow up by phone with any abnormal laboratory results  Do not adjust insulin. You are not on a sliding scale. Adjust your diet to a carbohydrate modified diet. Will follow up in office in 1 month.

## 2017-03-30 NOTE — Progress Notes (Signed)
Subjective:    Patient ID: Jill Shaw, female    DOB: 07-15-1976, 41 y.o.   MRN: [id number]  HPI  [name], a 41 year old patient presents  for a follow up of type 2 diabetes mellitus and hypertension. She states that she has been checking blood sugars inconsistently over the past several months.  She last had insulin this am. She was evaluated by Jill Courts, FNP several weeks ago and Humalog 75/25 was increased to 70 units twice daily. Jill Shaw has been adjusting Humalog when blood sugars are elevated. She denies hypoglycemia over the past month.   Patient denies foot ulcerations, increase appetite, nausea, polydipsia, polyuria, visual disturbances, vomitting and weight loss.   She also has a history of hypertension. She is not exercising or following a lowfat, low sodium diet. She also does not check blood pressures at home.   Cardiovascular risk factors: obesity (BMI >= 30 kg/m2), sedentary lifestyle and smoking/ tobacco exposure.   Patient is also complaining of a persistent cough, shortness of breath, and lower extremity edema. She does not have a history of hear failure. Jill Shaw most recent chest xray shows bronchitis, she is not on daily inhaler for bronchitis.  Patient has not had recent travel.  Weight has increased by 20  pounds over last several months, on diuretics.  Appetite has been varying. Symptoms are exacerbated by any exercise, climbing stairs, housework, lying flat and minimal activity.  Past Medical History:  Diagnosis Date  . Arthritis   . Diabetes mellitus without complication (HCC)   . Hypertension   . Sickle cell trait (HCC)    Immunization History  Administered Date(s) Administered  . Pneumococcal Polysaccharide-23 03/28/2015  . Tdap 03/27/2016   Past Surgical History:  Procedure Laterality Date  . CESAREAN SECTION     Social History   Social History  . Marital status: Single    Spouse name: N/A  . Number of children: N/A  . Years of education:  N/A   Occupational History  . Not on file.   Social History Main Topics  . Smoking status: Current Every Day Smoker    Packs/day: 0.50    Types: Cigarettes  . Smokeless tobacco: Never Used  . Alcohol use No  . Drug use: No  . Sexual activity: Not on file   Other Topics Concern  . Not on file   Social History Narrative  . No narrative on file       Review of Systems  Constitutional: Positive for fatigue. Negative for fever and unexpected weight change.  HENT: Positive for postnasal drip and rhinorrhea. Negative for congestion and dental problem.   Eyes: Negative.  Negative for photophobia and visual disturbance.  Respiratory: Positive for cough and shortness of breath. Negative for chest tightness and wheezing.   Cardiovascular: Positive for leg swelling. Negative for chest pain.  Gastrointestinal: Positive for constipation. Negative for rectal pain and vomiting.  Endocrine: Negative.  Negative for polydipsia, polyphagia and polyuria.  Genitourinary: Negative.  Negative for dyspareunia and dysuria.  Musculoskeletal: Positive for arthralgias, back pain and myalgias (Bilateral knee pain).  Skin: Negative.   Allergic/Immunologic: Negative.  Negative for immunocompromised state.  Neurological: Negative.   Hematological: Negative.   Psychiatric/Behavioral: Negative.  Negative for sleep disturbance and suicidal ideas.       Objective:   Physical Exam  Constitutional: She is oriented to person, place, and time. She appears well-developed and well-nourished.  HENT:  Head: Normocephalic and atraumatic.  Right Ear: External  ear normal.  Left Ear: External ear normal.  Eyes: Conjunctivae and EOM are normal. Pupils are equal, round, and reactive to light.  Neck: Normal range of motion. Neck supple. No thyroid mass present.  Cardiovascular: Normal rate, regular rhythm, normal heart sounds and intact distal pulses.   2 + bilateral lower extremity edema  Pulmonary/Chest: Effort  normal and breath sounds normal.  Abdominal: Soft. Bowel sounds are normal.  Increased abdominal girth  Musculoskeletal:       Right knee: She exhibits no swelling.       Left knee: She exhibits no swelling.  Neurological: She is alert and oriented to person, place, and time. She has normal reflexes.  Skin: Skin is warm and dry.  Psychiatric: She has a normal mood and affect. Her behavior is normal. Judgment and thought content normal.      BP (!) 110/49 (BP Location: Left Arm, Patient Position: Sitting, Cuff Size: Large)   Pulse 83   Temp 97.8 F (36.6 C) (Oral)   Resp 18   Ht 5\' 4"  (1.626 m)   Wt 273 lb (123.8 kg)   SpO2 100%   BMI 46.86 kg/m  Assessment & Plan:  1. Essential hypertension Blood pressure is at goal on current medication regimen.   2. Type 2 diabetes mellitus without complication, with long-term current use of insulin (HCC) - Glucose, capillary - HgB A1c - CBC with Differential - insulin lispro protamine-lispro (HUMALOG 75/25 MIX) (75-25) 100 UNIT/ML SUSP injection; Inject 70 Units into the skin 2 (two) times daily with a meal.  Dispense: 10 mL; Refill: 5  3. Dyspnea, unspecified type Will send a rescue inhaler to pharmacy for chronic bronchitis exacerbations. Discussed at length.   - Brain natriuretic peptide - ECHOCARDIOGRAM COMPLETE; Future - COMPLETE METABOLIC PANEL WITH GFR - CBC with Differential  4. Dizziness  - Brain natriuretic peptide - ECHOCARDIOGRAM COMPLETE; Future  5. Bilateral lower extremity edema Will continue Furosemide at 20 mg twice daily.  Will discontinue thiazide diuretic  - Brain natriuretic peptide - ECHOCARDIOGRAM COMPLETE; Future  6. Chronic bronchitis, unspecified chronic bronchitis type (HCC) - albuterol (PROVENTIL HFA;VENTOLIN HFA) 108 (90 Base) MCG/ACT inhaler; Inhale 2 puffs into the lungs every 6 (six) hours as needed for wheezing or shortness of breath.  Dispense: 1 Inhaler; Refill: 0  7. Tobacco  dependence Smoking cessation instruction/counseling given:  counseled patient on the dangers of tobacco use, advised patient to stop smoking, and reviewed strategies to maximize success  RTC: Will follow up by phone with abnormal laboratory results  1 months for chronic conditions   Erin SonsLaChina Rennis PettyMoore Chardai Gangemi  MSN, FNP-C [facility] [address]  [place], [place] [address] [phone]

## 2017-03-31 LAB — BRAIN NATRIURETIC PEPTIDE: BRAIN NATRIURETIC PEPTIDE: 67.5 pg/mL (ref <100)

## 2017-04-01 MED ORDER — ALBUTEROL SULFATE HFA 108 (90 BASE) MCG/ACT IN AERS: 2.0000 | INHALATION_SPRAY | Freq: Four times a day (QID) | RESPIRATORY_TRACT | 0 refills | Status: DC | PRN

## 2017-04-02 MED FILL — OMEPRAZOLE DR 40 MG CAPSULE: 40 | 30 days supply | Qty: 30 | Fill #1

## 2017-04-03 LAB — IRON AND TIBC
%SAT: 12 % (ref 11–50)
IRON: 56 ug/dL (ref 40–190)
TIBC: 479 ug/dL — AB (ref 250–450)
UIBC: 423 ug/dL

## 2017-04-03 LAB — FERRITIN: Ferritin: 13 ng/mL (ref 10–232)

## 2017-04-05 ENCOUNTER — Encounter: Payer: Self-pay | Admitting: Neurology

## 2017-04-06 ENCOUNTER — Other Ambulatory Visit: Payer: Self-pay | Admitting: Family Medicine

## 2017-04-06 DIAGNOSIS — D638 Anemia in other chronic diseases classified elsewhere: Secondary | ICD-10-CM | POA: Insufficient documentation

## 2017-04-10 ENCOUNTER — Ambulatory Visit (HOSPITAL_COMMUNITY): Payer: Medicaid - Out of State

## 2017-04-16 ENCOUNTER — Ambulatory Visit (HOSPITAL_COMMUNITY)
Admission: RE | Admit: 2017-04-16 | Discharge: 2017-04-16 | Disposition: A | Discharge status: Home / Self Care | Payer: Medicaid Other | Source: Ambulatory Visit | Attending: Family Medicine | Admitting: Family Medicine

## 2017-04-16 ENCOUNTER — Other Ambulatory Visit: Payer: Self-pay | Admitting: *Deleted

## 2017-04-16 DIAGNOSIS — E119 Type 2 diabetes mellitus without complications: Secondary | ICD-10-CM | POA: Insufficient documentation

## 2017-04-16 DIAGNOSIS — R42 Dizziness and giddiness: Secondary | ICD-10-CM | POA: Diagnosis not present

## 2017-04-16 DIAGNOSIS — R600 Localized edema: Secondary | ICD-10-CM | POA: Diagnosis not present

## 2017-04-16 DIAGNOSIS — R0600 Dyspnea, unspecified: Secondary | ICD-10-CM

## 2017-04-16 DIAGNOSIS — I119 Hypertensive heart disease without heart failure: Secondary | ICD-10-CM | POA: Diagnosis not present

## 2017-04-16 DIAGNOSIS — Z794 Long term (current) use of insulin: Principal | ICD-10-CM

## 2017-04-16 DIAGNOSIS — Z720 Tobacco use: Secondary | ICD-10-CM | POA: Diagnosis not present

## 2017-04-16 DIAGNOSIS — I358 Other nonrheumatic aortic valve disorders: Secondary | ICD-10-CM | POA: Insufficient documentation

## 2017-04-16 MED ORDER — INSULIN LISPRO PROT & LISPRO (75-25 MIX) 100 UNIT/ML ~~LOC~~ SUSP: 70.0000 [IU] | Freq: Two times a day (BID) | SUBCUTANEOUS | 3 refills | Status: DC

## 2017-04-16 MED ORDER — SAXAGLIPTIN HCL 2.5 MG PO TABS: 2.5000 mg | ORAL_TABLET | Freq: Every day | ORAL | 3 refills | Status: DC

## 2017-04-16 NOTE — Progress Notes (Signed)
  Echocardiogram 2D Echocardiogram has been performed.  Hank Walling T Lasheika Ortloff 04/16/2017, 12:24 PM

## 2017-04-16 NOTE — Telephone Encounter (Signed)
PRINTED FOR PASS PROGRAM 

## 2017-04-18 ENCOUNTER — Telehealth: Payer: Self-pay | Admitting: Family Medicine

## 2017-04-18 MED ORDER — BUDESONIDE-FORMOTEROL FUMARATE 80-4.5 MCG/ACT IN AERO: 2.0000 | INHALATION_SPRAY | Freq: Two times a day (BID) | RESPIRATORY_TRACT | 5 refills | Status: DC

## 2017-04-18 NOTE — Telephone Encounter (Signed)
Ms. Judee ClaraJulie Hinderliter, a [age] year old female with a history of type 2 diabetes mellitis, hypertension, and morbid obesity was evaluated on 03/30/2017 dyspnea and bilateral lower extremity edema. A BNP and 2 D echo were ordered to r/o heart failure. BNP was within a normal range. Ms. Henriette CombsJohnson's LVEF was 55-60 %.   [name] does not have heart failure. She continues to have bilateral lower extremity edema and dyspnea. Reviewed CMP, GFR is within a normal range. I will continue a low dose of furosemide at this time.  She also continues to have dyspnea. I suspect that dyspnea is related to long smoking history and chronic bronchitis. Will continue albuterol rescue inhaler 2 puffs every 6 hours as needed. Will start Symbicort 80-4.5 2 puffs BID. Also recommend smoking cessation to decrease dyspnea.   Morbid obesity due to excessive calorie intake. Recommend a lowfat, carbohydrate modified diet. She typically eats 1-2 large meals per day. I recommend that she spreads meals throughout the day and increase daily activity level.   Will follow up in clinic as scheduled.    Nolon NationsLaChina Moore Inesha Sow  MSN, FNP-C [facility] [address]  [place], [place] [address] [phone]

## 2017-04-19 NOTE — Telephone Encounter (Signed)
Called and spoke with patient. Advised that echocardiogram does not indicate heart failure. Advised to continue taking furosemide 20mg  daily. Advised that the dyspnea is suspected to be related to the chronic bronchitis and that she needs to start symbicort twice daily as directed. Also advised to continue albuterol when needed for wheezing, shortness of breath, or persistent coughing. Patient verbalized understanding. I also advised that we will re-check potassium at next follow up and to keep next follow up appointment.

## 2017-04-20 ENCOUNTER — Ambulatory Visit (INDEPENDENT_AMBULATORY_CARE_PROVIDER_SITE_OTHER): Payer: Medicaid Other | Admitting: Family Medicine

## 2017-04-20 ENCOUNTER — Encounter: Payer: Self-pay | Admitting: Family Medicine

## 2017-04-20 VITALS — BP 132/52 | HR 106 | Temp 98.5°F | Resp 18 | Ht 64.0 in | Wt 273.0 lb

## 2017-04-20 DIAGNOSIS — D649 Anemia, unspecified: Secondary | ICD-10-CM

## 2017-04-20 DIAGNOSIS — I10 Essential (primary) hypertension: Secondary | ICD-10-CM

## 2017-04-20 DIAGNOSIS — M79604 Pain in right leg: Secondary | ICD-10-CM

## 2017-04-20 DIAGNOSIS — E119 Type 2 diabetes mellitus without complications: Secondary | ICD-10-CM

## 2017-04-20 DIAGNOSIS — N921 Excessive and frequent menstruation with irregular cycle: Secondary | ICD-10-CM

## 2017-04-20 DIAGNOSIS — Z794 Long term (current) use of insulin: Secondary | ICD-10-CM

## 2017-04-20 NOTE — Progress Notes (Signed)
Subjective:    Patient ID: Jill Shaw, female    DOB: 1975/11/17, 41 y.o.   MRN: [id number]  HPI  Jill Shaw, a 41 year old female with a history of type 2 diabetes mellitus, hypertension, morbid obesity, and chronic bronchitis presents for a 1 month follow up. Jill Shaw was evaluated in clinic on 03/30/2017 and found to have worsening dyspnea and lower extremity edema. A 2 D echocardiogram was ordered; estimated ejection fraction was 55-60%. There were no significant abnormalities noted.   Jill Shaw has a history of type 2 diabetes mellitus. Jill Shaw continues to check blood sugars inconsistently. Blood sugars have ranged between 160-200. Jill Shaw is no longer adjusting medication when blood sugars are elevated as instructed. Jill Shaw takes Humalog 75/25  70 units twice daily.Jill Shaw denies hypoglycemia over the past month.   Patient denies foot ulcerations, increase appetite, nausea, polydipsia, polyuria, visual disturbances, vomitting and weight loss.    Jill Shaw also has a history of hypertension. Jill Shaw is not exercising or following a lowfat, low sodium diet. Jill Shaw also does not check blood pressures at home.   Cardiovascular risk factors: obesity (BMI >= 30 kg/m2), sedentary lifestyle and smoking/ tobacco exposure.   Jill Shaw has a history of chronic bronchitis. Jill Shaw was started on Symbicort 1 month and cough has dissipated.    Jill Shaw is also complaining of heavy menstrual cycles over the past 6 months. Jill Shaw says that Jill Shaw typically saturates 7-8 overnight pads per day during cycle. Jill Shaw also complains of pelvic pain primarily during menstrual cycle. Jill Shaw says that it has been greater than 3 years since last pap smear. Jill Shaw denies vaginal itching, burning, dyspareunia, or vaginal pain.  Past Medical History:  Diagnosis Date  . Arthritis   . Diabetes mellitus without complication (HCC)   . Hypertension   . Sickle cell trait (HCC)    Immunization History  Administered Date(s) Administered  . Pneumococcal Polysaccharide-23  03/28/2015  . Tdap 03/27/2016   Past Surgical History:  Procedure Laterality Date  . CESAREAN SECTION     Social History   Social History  . Marital status: Single    Spouse name: N/A  . Number of children: N/A  . Years of education: N/A   Occupational History  . Not on file.   Social History Main Topics  . Smoking status: Current Every Day Smoker    Packs/day: 0.50    Types: Cigarettes  . Smokeless tobacco: Never Used  . Alcohol use No  . Drug use: No  . Sexual activity: Not on file   Other Topics Concern  . Not on file   Social History Narrative  . No narrative on file       Review of Systems  Constitutional: Positive for fatigue. Negative for fever and unexpected weight change.  HENT: Negative for congestion and dental problem.   Eyes: Negative.  Negative for photophobia and visual disturbance.  Respiratory: Positive for shortness of breath. Negative for chest tightness and wheezing.   Cardiovascular: Positive for leg swelling. Negative for chest pain.  Gastrointestinal: Positive for constipation. Negative for rectal pain and vomiting.  Endocrine: Negative.  Negative for polydipsia, polyphagia and polyuria.  Genitourinary: Positive for menstrual problem and pelvic pain. Negative for dyspareunia and dysuria.       Heavy menstrual cycles  Musculoskeletal: Positive for arthralgias, back pain and myalgias (Bilateral knee pain).  Skin: Negative.   Allergic/Immunologic: Negative.  Negative for immunocompromised state.  Neurological: Negative.   Hematological: Negative.   Psychiatric/Behavioral: Negative.  Negative for sleep disturbance and suicidal ideas.       Objective:   Physical Exam  Constitutional: Jill Shaw is oriented to person, place, and time. Jill Shaw appears well-developed and well-nourished.  HENT:  Head: Normocephalic and atraumatic.  Right Ear: External ear normal.  Left Ear: External ear normal.  Eyes: Conjunctivae and EOM are normal. Pupils are equal,  round, and reactive to light.  Neck: Normal range of motion. Neck supple. No thyroid mass present.  Cardiovascular: Normal rate, regular rhythm, normal heart sounds and intact distal pulses.   2 + bilateral lower extremity edema  Pulmonary/Chest: Effort normal and breath sounds normal.  Abdominal: Soft. Bowel sounds are normal.  Increased abdominal girth  Musculoskeletal:       Right knee: Jill Shaw exhibits no swelling.       Left knee: Jill Shaw exhibits no swelling.  Neurological: Jill Shaw is alert and oriented to person, place, and time. Jill Shaw has normal reflexes.  Skin: Skin is warm and dry.  Psychiatric: Jill Shaw has a normal mood and affect. Her behavior is normal. Judgment and thought content normal.      BP (!) 132/52 (BP Location: Left Arm, Patient Position: Sitting, Cuff Size: Large)   Pulse (!) 106   Temp 98.5 F (36.9 C) (Oral)   Resp 18   Ht 5\' 4"  (1.626 m)   Wt 273 lb (123.8 kg)   LMP 04/18/2017   SpO2 100%   BMI 46.86 kg/m  Assessment & Plan:  1. Type 2 diabetes mellitus without complication, with long-term current use of insulin (HCC) Previous hemoglobin a1C was 6.7 three weeks ago, which is at goal. Will continue Humalog 70/25 units twice daily.  2. Essential hypertension Blood pressure is at goal on current medication regimen. I will continue medications at current dosages.   3. Menorrhagia with irregular cycle Jill Shaw says that menstrual cycles are becoming heavier each month. Jill Shaw is saturating multiple overnight pads and clothing. Jill Shaw warrants a pelvic and transvaginal ultrasound.  - US Pelvis Complete; Future - US Transvaginal Non-OB; Future  4. Anemia, unspecified type Hemoglobin is 8.2. I suspect that anemia is due to menorrhagia. Refer to #3.   5. Morbid obesity (HCC) Jill Shaw has had consistent weight gain. Jill Shaw has attempted multiple interventions to decrease weight. Jill Shaw is unable to exercise consistently due to increased pain to lower extremities. I will send a  referral to bariatric surgery for an initial consult. Recommend a lowfat, low carbohydrate diet divided over 5-6 small meals, increase water intake to 6-8 glasses, and 150 minutes per week of cardiovascular exercise.   - Amb Referral to Bariatric Surgery  6. Right leg pain Recommend applying warm, moist compresses to right lower extremity. Also, Tylenol 500 mg every 6 hours as needed for mild to moderate pain.    RTC: 3 months for chronic conditions  Greater than 50% of visit was spent counseling about chronic conditions Nolon NationsLaChina Moore Markeese Boyajian  MSN, FNP-C [facility] [address]  [place], [place] [address] [phone]

## 2017-04-20 NOTE — Patient Instructions (Addendum)
1. Type 2 diabetes:  Will continue Insulin twice daily.  Recommend a lowfat, low carbohydrate diet divided over 5-6 small meals, increase water intake to 6-8 glasses, and increase daily actiity  2. Hypertension Blood pressure is at goal on current medication regimen, no changes at this time.   3. Morbid obesity Due to rapid weight gain: recommend daily weights  Will send a referral to Bariatric medicine.   4. Anemia Continue Ferrous sulfate 325 mg daily.  Complaining of heavy menstrual cycle, will schedule a pelvic and transvaginal ultrasound    Carbohydrate Counting for Diabetes Mellitus, Adult Carbohydrate counting is a method for keeping track of how many carbohydrates you eat. Eating carbohydrates naturally increases the amount of sugar (glucose) in the blood. Counting how many carbohydrates you eat helps keep your blood glucose within normal limits, which helps you manage your diabetes (diabetes mellitus). It is important to know how many carbohydrates you can safely have in each meal. This is different for every person. A diet and nutrition specialist (registered dietitian) can help you make a meal plan and calculate how many carbohydrates you should have at each meal and snack. Carbohydrates are found in the following foods:  Grains, such as breads and cereals.  Dried beans and soy products.  Starchy vegetables, such as potatoes, peas, and corn.  Fruit and fruit juices.  Milk and yogurt.  Sweets and snack foods, such as cake, cookies, candy, chips, and soft drinks.  How do I count carbohydrates? There are two ways to count carbohydrates in food. You can use either of the methods or a combination of both. Reading "Nutrition Facts" on packaged food The "Nutrition Facts" list is included on the labels of almost all packaged foods and beverages in the [place] It includes:  The serving size.  Information about nutrients in each serving, including the grams (g) of carbohydrate  per serving.  To use the "Nutrition Facts":  Decide how many servings you will have.  Multiply the number of servings by the number of carbohydrates per serving.  The resulting number is the total amount of carbohydrates that you will be having.  Learning standard serving sizes of other foods When you eat foods containing carbohydrates that are not packaged or do not include "Nutrition Facts" on the label, you need to measure the servings in order to count the amount of carbohydrates:  Measure the foods that you will eat with a food scale or measuring cup, if needed.  Decide how many standard-size servings you will eat.  Multiply the number of servings by 15. Most carbohydrate-rich foods have about 15 g of carbohydrates per serving. ? For example, if you eat 8 oz (170 g) of strawberries, you will have eaten 2 servings and 30 g of carbohydrates (2 servings x 15 g = 30 g).  For foods that have more than one food mixed, such as soups and casseroles, you must count the carbohydrates in each food that is included.  The following list contains standard serving sizes of common carbohydrate-rich foods. Each of these servings has about 15 g of carbohydrates:   hamburger bun or  English muffin.   oz (15 mL) syrup.   oz (14 g) jelly.  1 slice of bread.  1 six-inch tortilla.  3 oz (85 g) cooked rice or pasta.  4 oz (113 g) cooked dried beans.  4 oz (113 g) starchy vegetable, such as peas, corn, or potatoes.  4 oz (113 g) hot cereal.  4 oz (  113 g) mashed potatoes or  of a large baked potato.  4 oz (113 g) canned or frozen fruit.  4 oz (120 mL) fruit juice.  4-6 crackers.  6 chicken nuggets.  6 oz (170 g) unsweetened dry cereal.  6 oz (170 g) plain fat-free yogurt or yogurt sweetened with artificial sweeteners.  8 oz (240 mL) milk.  8 oz (170 g) fresh fruit or one small piece of fruit.  24 oz (680 g) popped popcorn.  Example of carbohydrate counting Sample  meal  3 oz (85 g) chicken breast.  6 oz (170 g) brown rice.  4 oz (113 g) corn.  8 oz (240 mL) milk.  8 oz (170 g) strawberries with sugar-free whipped topping. Carbohydrate calculation 1. Identify the foods that contain carbohydrates: ? Rice. ? Corn. ? Milk. ? Strawberries. 2. Calculate how many servings you have of each food: ? 2 servings rice. ? 1 serving corn. ? 1 serving milk. ? 1 serving strawberries. 3. Multiply each number of servings by 15 g: ? 2 servings rice x 15 g = 30 g. ? 1 serving corn x 15 g = 15 g. ? 1 serving milk x 15 g = 15 g. ? 1 serving strawberries x 15 g = 15 g. 4. Add together all of the amounts to find the total grams of carbohydrates eaten: ? 30 g + 15 g + 15 g + 15 g = 75 g of carbohydrates total. This information is not intended to replace advice given to you by your health care provider. Make sure you discuss any questions you have with your health care provider. Document Released: 10/02/2005 Document Revised: 04/21/2016 Document Reviewed: 03/15/2016 Elsevier Interactive Patient Education  [employer].

## 2017-04-23 ENCOUNTER — Encounter (HOSPITAL_COMMUNITY): Payer: Self-pay | Admitting: Emergency Medicine

## 2017-04-23 ENCOUNTER — Telehealth: Payer: Self-pay

## 2017-04-23 ENCOUNTER — Ambulatory Visit (HOSPITAL_COMMUNITY)
Admission: EM | Admit: 2017-04-23 | Discharge: 2017-04-23 | Disposition: A | Discharge status: Home / Self Care | Payer: Medicaid Other | Attending: Physician Assistant | Admitting: Physician Assistant

## 2017-04-23 DIAGNOSIS — G8929 Other chronic pain: Secondary | ICD-10-CM

## 2017-04-23 DIAGNOSIS — M545 Low back pain: Secondary | ICD-10-CM

## 2017-04-23 DIAGNOSIS — M25561 Pain in right knee: Secondary | ICD-10-CM

## 2017-04-23 MED FILL — $HUMALOG MIX 75-25 KWIKPEN: (75-25) 100 | 30 days supply | Qty: 45 | Fill #0

## 2017-04-23 NOTE — ED Provider Notes (Signed)
CSN: [id number]     Arrival date & time 04/23/17  1236 History   None    Chief Complaint  Patient presents with  . Pain   (Consider location/radiation/quality/duration/timing/severity/associated sxs/prior Treatment) 41 year old female with history of arthritis, diabetes, hypertension comes in with 8 month history of on and off knee and back pain which has worsened In the past week. Patient got health insurance to start of this month, and has been following her PCP for diabetes management. Due to the multiple problems she was seen PCP for, they have not been able to address her back pain and knee pain. She is coming in due to increasing pain. Has not taken anything for the pain. Pain is exacerbated with walking and movement. Hot compresses with minimal relief. Denies injury, numbness and tingling, swelling of the joints, erythema, warmth. Denies loss of bladder or bowel control, numbness of the inner thighs.       Past Medical History:  Diagnosis Date  . Arthritis   . Diabetes mellitus without complication ([place])   . Hypertension   . Sickle cell trait [facility])    Past Surgical History:  Procedure Laterality Date  . CESAREAN SECTION     No family history on file. Social History  Substance Use Topics  . Smoking status: Current Every Day Smoker    Packs/day: 0.50    Types: Cigarettes  . Smokeless tobacco: Never Used  . Alcohol use No   OB History    No data available     Review of Systems  Respiratory: Negative for chest tightness, shortness of breath and wheezing.   Musculoskeletal: Positive for arthralgias and back pain. Negative for gait problem, joint swelling and myalgias.    Allergies  Ibuprofen  Home Medications   Prior to Admission medications   Medication Sig Start Date End Date Taking? Authorizing Provider  albuterol (PROVENTIL HFA;VENTOLIN HFA) 108 (90 Base) MCG/ACT inhaler Inhale 2 puffs into the lungs every 6 (six) hours as needed for wheezing or shortness of  breath. 04/01/17   Dorena Dew, FNP  Blood Glucose Monitoring Suppl (TRUE METRIX METER) w/Device KIT 1 each by Does not apply route 4 (four) times daily -  before meals and at bedtime. 12/06/16   Dorena Dew, FNP  budesonide-formoterol (SYMBICORT) 80-4.5 MCG/ACT inhaler Inhale 2 puffs into the lungs 2 (two) times daily. 04/18/17   Dorena Dew, FNP  docusate sodium (COLACE) 100 MG capsule Take 1 capsule (100 mg total) by mouth daily. 09/22/16   Dorena Dew, FNP  Ferrous Sulfate (IRON) 325 (65 Fe) MG TABS Take 325 mg by mouth daily. 03/19/17   Scot Jun, FNP  fluticasone (FLONASE) 50 MCG/ACT nasal spray Place 2 sprays into both nostrils daily. 11/21/16   Dorena Dew, FNP  furosemide (LASIX) 20 MG tablet Take 1 tablet (20 mg total) by mouth 2 (two) times daily as needed. 03/09/17   Scot Jun, FNP  glucose blood (FREESTYLE TEST STRIPS) test strip Use as instructed 11/21/16   Dorena Dew, FNP  glucose blood (TRUE METRIX BLOOD GLUCOSE TEST) test strip Use as instructed 12/06/16   Dorena Dew, FNP  HUMALOG MIX 75/25 KWIKPEN (75-25) 100 UNIT/ML Kwikpen INJECT 70 UNITS INTO THE SKIN 2 TIMES DAILY WITH A MEAL 04/02/17   Dorena Dew, FNP  insulin lispro protamine-lispro (HUMALOG 75/25 MIX) (75-25) 100 UNIT/ML SUSP injection Inject 70 Units into the skin 2 (two) times daily with a meal. 04/16/17   Jegede,  Olugbemiga E, MD  losartan (COZAAR) 50 MG tablet Take 1 tablet (50 mg total) by mouth daily. 03/09/17   Scot Jun, FNP  omeprazole (PRILOSEC) 40 MG capsule Take 1 capsule (40 mg total) by mouth daily. 04/11/16   Dorena Dew, FNP  potassium chloride (K-DUR) 10 MEQ tablet Take 1 tablet (10 mEq total) by mouth daily. 03/09/17   Scot Jun, FNP  pregabalin (LYRICA) 75 MG capsule Take 1 capsule (75 mg total) by mouth 2 (two) times daily. Patient not taking: Reported on 03/30/2017 03/09/17   Scot Jun, FNP  saxagliptin HCl (ONGLYZA) 2.5 MG  TABS tablet Take 1 tablet (2.5 mg total) by mouth daily. 04/16/17   Tresa Garter, MD  tizanidine (ZANAFLEX) 6 MG capsule Take 6 mg by mouth 3 (three) times daily.    [provider]   Meds Ordered and Administered this Visit  Medications - No data to display  BP (!) 149/68 (BP Location: Right Arm)   Pulse (!) 102   Temp 98.5 F (36.9 C) (Oral)   Resp 20   LMP 04/18/2017   SpO2 98%  No data found.   Physical Exam  Constitutional: She is oriented to person, place, and time. She appears well-developed and well-nourished. No distress.  HENT:  Head: Normocephalic and atraumatic.  Eyes: Conjunctivae are normal. Pupils are equal, round, and reactive to light.  Cardiovascular: Normal rate, regular rhythm and normal heart sounds.  Exam reveals no gallop and no friction rub.   No murmur heard. Pulmonary/Chest: Effort normal and breath sounds normal. She has no wheezes. She has no rales.  Musculoskeletal:  Tenderness on palpation of the spinous processes of the lumbar region. No tenderness on palpation of the left or right muscles. Full range of motion.  No tenderness on palpation of the left knee. Full ROM, strength normal and equal bilaterally. Sensation intact.   No swelling, erythema, warmth noted of the right knee. Tenderness on palpation of the lateral joint line. Full range of motion. Strength normal and equal bilaterally. Sensation intact.  Negative straight leg raise.  Neurological: She is alert and oriented to person, place, and time.  Skin: Skin is warm and dry.  Psychiatric: She has a normal mood and affect. Her behavior is normal. Judgment normal.    Urgent Care Course     Procedures (including critical care time)  Labs Review Labs Reviewed - No data to display  Imaging Review No results found.    MDM   1. Chronic pain of right knee   2. Chronic midline low back pain without sciatica    Discussed with patient history and exam most consistent  with arthritis of the lower back and the knees given no history of injury/trauma. Given patient is allergic to NSAIDs, with nausea, will defer NSAIDs. Given patient is diabetic, we are avoiding steroid use. Will provide patient with knee brace. Patient to ice compress, rest, and elevate knee. Patient to get back brace to help with back pain. To follow up with PCP for further workup and PT referral as needed. Patient to monitor for worsening of symptoms, loss of bladder or bowel control, numbness of the inner thighs, due to the ED for further evaluation.    Ok Edwards, PA-C 04/23/17 1414

## 2017-04-23 NOTE — ED Triage Notes (Signed)
Generalized aches and pain-all over.  Pain worsened over the past "8 months"patient just received medicaid 04/15/2017.

## 2017-04-23 NOTE — Telephone Encounter (Signed)
Called again at 2:11pm, no answer.

## 2017-04-23 NOTE — Telephone Encounter (Signed)
Patient called back and is asking if she can be sent to someone who treats her arthritis. She says she is having pain in her leg and she knows it is the arthritis.   Also, She is asking if she can be put back on dyazide since her echo was ok. Please advise. Thanks!

## 2017-04-23 NOTE — Telephone Encounter (Signed)
Called, no answer. Left message for patient to return call after 1:15pm. Thanks!

## 2017-04-27 ENCOUNTER — Other Ambulatory Visit: Payer: Self-pay | Admitting: Family Medicine

## 2017-04-27 ENCOUNTER — Telehealth: Payer: Self-pay | Admitting: Family Medicine

## 2017-04-27 NOTE — Telephone Encounter (Signed)
[name], a 41 year old female with a history of type 2 diabetes mellitus, hypertension, and lower extremity edema called requesting a referral for arthritis. [name] has never been assessed for arthritis. She will need to schedule an appointment for further work up and evaluation of problem.    Nolon NationsLaChina Moore Hollis  MSN, FNP-C [facility] [address]  [place], [place] [address] [phone]

## 2017-04-30 ENCOUNTER — Ambulatory Visit: Payer: Medicaid Other | Admitting: Neurology

## 2017-05-07 ENCOUNTER — Encounter: Payer: Self-pay | Admitting: Podiatry

## 2017-05-07 ENCOUNTER — Ambulatory Visit (INDEPENDENT_AMBULATORY_CARE_PROVIDER_SITE_OTHER): Payer: Medicaid Other

## 2017-05-07 ENCOUNTER — Ambulatory Visit (INDEPENDENT_AMBULATORY_CARE_PROVIDER_SITE_OTHER): Payer: Medicaid Other | Admitting: Podiatry

## 2017-05-07 DIAGNOSIS — M76821 Posterior tibial tendinitis, right leg: Secondary | ICD-10-CM

## 2017-05-07 DIAGNOSIS — M79672 Pain in left foot: Principal | ICD-10-CM

## 2017-05-07 DIAGNOSIS — M79671 Pain in right foot: Secondary | ICD-10-CM

## 2017-05-07 MED ORDER — BETAMETHASONE SOD PHOS & ACET 6 (3-3) MG/ML IJ SUSP: 3.0000 mg | Freq: Once | INTRAMUSCULAR | Status: DC

## 2017-05-07 NOTE — Progress Notes (Signed)
   HPI: Patient presents today as a new patient for evaluation of pain and tenderness to both sides of her ankle. Patient also has bilateral heel pain. This been going on for several months and is been no alleviation of symptoms for the patient. Patient is not tried any treatments at home and patient denies trauma.   Physical Exam: General: The patient is alert and oriented x3 in no acute distress.  Dermatology: Skin is warm, dry and supple bilateral lower extremities. Negative for open lesions or macerations.  Vascular: Palpable pedal pulses bilaterally. No edema or erythema noted. Capillary refill within normal limits.  Neurological: Epicritic and protective threshold grossly intact bilaterally.   Musculoskeletal Exam: Pain on palpation noted along the posterior tibial tendon of the right lower extremity. Range of motion within normal limits to all pedal and ankle joints bilateral. Muscle strength 5/5 in all groups bilateral.   Radiographic Exam:  Normal osseous mineralization. Joint spaces preserved. No fracture/dislocation/boney destruction.  Decreased calcaneal inclination angle with a decreased metatarsal declination angle noted consistent with pes planus deformity.  Assessment: 1. Posterior tibial tendinitis right lower extremity 2. Bilateral flatfoot deformity   Plan of Care:  1. Patient was evaluated. X-rays reviewed today 2. Injection of 0.5 mL Celestone Soluspan injected into the posterior tibial tendon sheath right lower extremity 3. Today an ankle brace was dispensed to support the pedal and ankle structures of the right foot 4. The patient cannot tolerate oral anti-inflammatories. Patient is allergic to ibuprofen 5. Return to clinic in 4 weeks   Felecia ShellingBrent M. Evans, DPM [facility]  Dr. Felecia ShellingBrent M. Evans, DPM    [address]                                        [place], [place] [address]                Office [phone]  Fax [phone]

## 2017-05-08 ENCOUNTER — Telehealth: Payer: Self-pay

## 2017-05-08 ENCOUNTER — Other Ambulatory Visit: Payer: Self-pay | Admitting: Family Medicine

## 2017-05-08 DIAGNOSIS — I10 Essential (primary) hypertension: Secondary | ICD-10-CM

## 2017-05-08 MED ORDER — FUROSEMIDE 20 MG PO TABS: 20.0000 mg | ORAL_TABLET | Freq: Two times a day (BID) | ORAL | 5 refills | Status: DC | PRN

## 2017-05-08 NOTE — Telephone Encounter (Signed)
Armeniahina,  Is this ok to refill the furosemide? Please advise.

## 2017-05-09 ENCOUNTER — Telehealth: Payer: Self-pay

## 2017-05-09 ENCOUNTER — Other Ambulatory Visit: Payer: Self-pay | Admitting: Family Medicine

## 2017-05-09 DIAGNOSIS — I10 Essential (primary) hypertension: Secondary | ICD-10-CM

## 2017-05-09 MED ORDER — HYDROCHLOROTHIAZIDE 12.5 MG PO TABS: 25.0000 mg | ORAL_TABLET | Freq: Every day | ORAL | 1 refills | Status: DC

## 2017-05-09 NOTE — Telephone Encounter (Signed)
Spoke with patient advised to stop furosemide and start spirolactone and that this has been sent into pharmacy. Thanks!

## 2017-05-09 NOTE — Progress Notes (Signed)
Meds ordered this encounter  Medications  . hydrochlorothiazide (HYDRODIURIL) 12.5 MG tablet    Sig: Take 2 tablets (25 mg total) by mouth daily.    Dispense:  90 tablet    Refill:  1    Nolon NationsLaChina Moore Suprina [place]eville  MSN, FNP-C [facility] [address]  [place], [place] [address] [phone]

## 2017-05-09 NOTE — Telephone Encounter (Signed)
Patient called back saying that she wanted a different fluid pill because the lasix was not working. Please advise. Does she need an appointment?

## 2017-05-10 ENCOUNTER — Telehealth: Payer: Self-pay

## 2017-05-10 ENCOUNTER — Other Ambulatory Visit: Payer: Self-pay | Admitting: Family Medicine

## 2017-05-10 DIAGNOSIS — E119 Type 2 diabetes mellitus without complications: Secondary | ICD-10-CM

## 2017-05-10 DIAGNOSIS — Z794 Long term (current) use of insulin: Principal | ICD-10-CM

## 2017-05-10 MED ORDER — POTASSIUM CHLORIDE ER 10 MEQ PO TBCR: 10.0000 meq | EXTENDED_RELEASE_TABLET | Freq: Every day | ORAL | 0 refills | Status: DC

## 2017-05-10 NOTE — Telephone Encounter (Signed)
Refill on potassium sent into pharmacy. Thanks!

## 2017-05-11 ENCOUNTER — Telehealth: Payer: Self-pay

## 2017-05-11 ENCOUNTER — Other Ambulatory Visit: Payer: Self-pay

## 2017-05-11 MED ORDER — POTASSIUM CHLORIDE ER 10 MEQ PO TBCR: 10.0000 meq | EXTENDED_RELEASE_TABLET | Freq: Every day | ORAL | 0 refills | Status: DC

## 2017-05-11 NOTE — Telephone Encounter (Signed)
Called back and left a message about Ultrasound appointment scheduled for 05/17/2017 @9 :00 am [facility]. Thanks!

## 2017-05-14 DIAGNOSIS — M79673 Pain in unspecified foot: Secondary | ICD-10-CM

## 2017-05-17 ENCOUNTER — Ambulatory Visit (HOSPITAL_COMMUNITY)
Admission: RE | Admit: 2017-05-17 | Discharge: 2017-05-17 | Disposition: A | Discharge status: Home / Self Care | Payer: Medicaid Other | Source: Ambulatory Visit | Attending: Family Medicine | Admitting: Family Medicine

## 2017-05-17 ENCOUNTER — Other Ambulatory Visit: Payer: Self-pay | Admitting: Family Medicine

## 2017-05-17 DIAGNOSIS — R9389 Abnormal findings on diagnostic imaging of other specified body structures: Secondary | ICD-10-CM

## 2017-05-17 DIAGNOSIS — N939 Abnormal uterine and vaginal bleeding, unspecified: Secondary | ICD-10-CM | POA: Insufficient documentation

## 2017-05-17 DIAGNOSIS — N921 Excessive and frequent menstruation with irregular cycle: Secondary | ICD-10-CM | POA: Diagnosis present

## 2017-05-24 DIAGNOSIS — K219 Gastro-esophageal reflux disease without esophagitis: Secondary | ICD-10-CM | POA: Insufficient documentation

## 2017-05-28 ENCOUNTER — Encounter: Payer: Self-pay | Admitting: Podiatry

## 2017-05-28 ENCOUNTER — Ambulatory Visit (INDEPENDENT_AMBULATORY_CARE_PROVIDER_SITE_OTHER): Payer: Medicaid Other | Admitting: Podiatry

## 2017-05-28 DIAGNOSIS — M76821 Posterior tibial tendinitis, right leg: Secondary | ICD-10-CM

## 2017-05-28 MED ORDER — HYDROCODONE-ACETAMINOPHEN 7.5-325 MG PO TABS: 1.0000 | ORAL_TABLET | Freq: Four times a day (QID) | ORAL | 0 refills | Status: DC | PRN

## 2017-05-28 NOTE — Progress Notes (Signed)
   HPI: 41 year old female presents to office today for follow-up treatment and evaluation regarding posterior tibial tendinitis to the right lower extremity. Patient states that the injection only helped for a couple of hours she received last visit. Patient states that the ankle brace does not help. Patient states that the pain is the same. Patient presents today for further treatment and evaluation   Physical Exam: General: The patient is alert and oriented x3 in no acute distress.  Dermatology: Skin is warm, dry and supple bilateral lower extremities. Negative for open lesions or macerations.  Vascular: Palpable pedal pulses bilaterally. No edema or erythema noted. Capillary refill within normal limits.  Neurological: Epicritic and protective threshold grossly intact bilaterally.   Musculoskeletal Exam: Pain on palpation noted along the posterior tibial tendon of the right lower extremity. Range of motion within normal limits to all pedal and ankle joints bilateral. Muscle strength 5/5 in all groups bilateral.   Assessment: 1. Posterior tibial tendinitis right lower extremity 2. Bilateral flatfoot deformity   Plan of Care:  1. Patient was evaluated. 2. Today we are going to hold off on the injection since it did not help and did not alleviate symptoms. 3. Discontinue ankle brace. Recommend that the patient purchase and immobilization cam boot and be weightbearing in the cam boot for 4 weeks 4. Prescription for Lortab 7.5/325 mg #30 5. The patient asked about permanent vs long-term vs short term disability. The patient currently does not work, however if she needed any sort of short-term disability forms I would be comfortable completing the paperwork for her. The patient is unable to work for approximately 8 weeks from today. 6. Return to clinic in 5 weeks  Felecia ShellingBrent M. Zana Biancardi, DPM [facility]  Dr. Felecia ShellingBrent M. Lenise Jr, DPM    [address]                                         [place], [place] [address]                Office [phone]  Fax [phone]

## 2017-05-29 ENCOUNTER — Ambulatory Visit (INDEPENDENT_AMBULATORY_CARE_PROVIDER_SITE_OTHER): Payer: Medicaid Other | Admitting: Family Medicine

## 2017-05-29 ENCOUNTER — Ambulatory Visit: Payer: Medicaid Other | Admitting: Family Medicine

## 2017-05-29 ENCOUNTER — Encounter: Payer: Self-pay | Admitting: Family Medicine

## 2017-05-29 ENCOUNTER — Telehealth: Payer: Self-pay | Admitting: Family Medicine

## 2017-05-29 VITALS — BP 124/62 | HR 85 | Temp 98.3°F | Resp 16 | Ht 64.0 in | Wt 273.0 lb

## 2017-05-29 DIAGNOSIS — Z719 Counseling, unspecified: Secondary | ICD-10-CM

## 2017-05-29 NOTE — Telephone Encounter (Signed)
See prior phone note regarding disability letter.

## 2017-05-29 NOTE — Telephone Encounter (Signed)
Jill Shaw,   Jill Shaw came into office today wanting to discuss disability and getting you to complete a letter recommending disability. I advised her that I would send you a note to follow-up with her regarding her requests.  Godfrey PickKimberly S. Tiburcio PeaHarris, MSN, FNP-C [facility]  [address] [place], [place] [address] [phone]

## 2017-05-29 NOTE — Progress Notes (Signed)
Patient came in today requesting a letter for disability due to her multiple chronic medical conditions. Advised patient that I would send her provider a note to follow-up with her regarding disability note.  Godfrey PickKimberly S. Tiburcio PeaHarris, MSN, FNP-C [facility]  [address] [place], [place] [address] [phone]

## 2017-05-31 DIAGNOSIS — E559 Vitamin D deficiency, unspecified: Secondary | ICD-10-CM | POA: Insufficient documentation

## 2017-05-31 MED FILL — $HUMALOG MIX 75-25 KWIKPEN: (75-25) 100 | 30 days supply | Qty: 45 | Fill #1

## 2017-06-04 ENCOUNTER — Telehealth: Payer: Self-pay

## 2017-06-04 NOTE — Telephone Encounter (Signed)
Called, no answer. Left message to call back

## 2017-06-04 NOTE — Telephone Encounter (Signed)
Patient called back. She was asking about referral to gyn. I advised that this had been sent and appointment is scheduled for 06/27/2017@1 :20pm. Patient verbalized understanding. Thanks!

## 2017-06-11 ENCOUNTER — Ambulatory Visit: Payer: Medicaid - Out of State | Admitting: Family Medicine

## 2017-06-13 ENCOUNTER — Telehealth: Payer: Self-pay | Admitting: *Deleted

## 2017-06-13 NOTE — Telephone Encounter (Addendum)
Pt requested refill of pain medication.06/14/2017-Left message informing pt that Dr. Logan BoresEvans had ordered refill of Norco, it could [place] today, and reminded pt of 07/02/2017 appt.

## 2017-06-14 ENCOUNTER — Telehealth: Payer: Self-pay | Admitting: Podiatry

## 2017-06-14 MED ORDER — HYDROCODONE-ACETAMINOPHEN 5-325 MG PO TABS: 1.0000 | ORAL_TABLET | Freq: Four times a day (QID) | ORAL | 0 refills | Status: DC | PRN

## 2017-06-14 NOTE — Telephone Encounter (Signed)
Left message informing pt she may want to pay for the pain medication, it will take several days to get a prior authorization.

## 2017-06-14 NOTE — Telephone Encounter (Signed)
Pt called stating she left a message yesterday requesting a refill of her medication. I informed her that one of our nursed Joya SanValery had called her and left her a message this morning around 9:20 am saying that Dr. Logan BoresEvans had ordered the refill and she would need to pick it up in the [place] office today by 5:00 pm and that she also wanted to remind her of her upcoming appointment on 17 September.

## 2017-06-14 NOTE — Telephone Encounter (Signed)
I just picked up my prescription and took it to the pharmacy. They are needing authorization due to my insurance.

## 2017-06-20 ENCOUNTER — Ambulatory Visit (INDEPENDENT_AMBULATORY_CARE_PROVIDER_SITE_OTHER): Payer: Self-pay | Admitting: Family Medicine

## 2017-06-20 ENCOUNTER — Encounter: Payer: Self-pay | Admitting: Family Medicine

## 2017-06-20 VITALS — BP 138/62 | HR 104 | Temp 98.4°F | Resp 18 | Ht 64.0 in | Wt 270.0 lb

## 2017-06-20 DIAGNOSIS — I10 Essential (primary) hypertension: Secondary | ICD-10-CM

## 2017-06-20 DIAGNOSIS — D649 Anemia, unspecified: Secondary | ICD-10-CM

## 2017-06-20 DIAGNOSIS — E119 Type 2 diabetes mellitus without complications: Secondary | ICD-10-CM

## 2017-06-20 DIAGNOSIS — Z794 Long term (current) use of insulin: Secondary | ICD-10-CM

## 2017-06-20 LAB — POCT GLYCOSYLATED HEMOGLOBIN (HGB A1C): Hemoglobin A1C: 7.5

## 2017-06-20 NOTE — Progress Notes (Signed)
Subjective:    Patient ID: Jill Shaw, female    DOB: [birth year], 41 y.o.   MRN: [id number]  HPI  Jill Shaw, a 41 year old female with a history of type 2 diabetes mellitus, hypertension, morbid obesity, and chronic bronchitis presents for a follow up of chronic conditions.  She has a history of type 2 diabetes mellitus. She continues to check blood sugars inconsistently. Blood sugars have ranged between 160-200. She is not following a carbohydrate modified diet or exercising routinely.  She takes Humalog 75/25  70 units twice daily.She denies hypoglycemia over the past month.  She was referred to bariatric surgery for a consult. She is a candidate for weight loss surgery and is scheduled to follow up.   Patient denies foot ulcerations, increase appetite, nausea, polydipsia, polyuria, visual disturbances, vomitting and weight loss.    She also has a history of hypertension. She is not exercising or following a lowfat, low sodium diet. She also does not check blood pressures at home.   Cardiovascular risk factors: obesity (BMI >= 30 kg/m2), sedentary lifestyle and smoking/ tobacco exposure.  Past Medical History:  Diagnosis Date  . Arthritis   . Diabetes mellitus without complication ([facility])   . Hypertension   . Sickle cell trait ([facility])    Immunization History  Administered Date(s) Administered  . Pneumococcal Polysaccharide-23 03/28/2015  . Tdap 03/27/2016   Past Surgical History:  Procedure Laterality Date  . CESAREAN SECTION     Social History   Social History  . Marital status: Single    Spouse name: N/A  . Number of children: N/A  . Years of education: N/A   Occupational History  . Not on file.   Social History Main Topics  . Smoking status: Current Every Day Smoker    Packs/day: 0.25    Types: Cigarettes  . Smokeless tobacco: Never Used  . Alcohol use No  . Drug use: No  . Sexual activity: Not on file   Other Topics Concern  . Not on file   Social  History Narrative  . No narrative on file       Review of Systems  Constitutional: Positive for fatigue. Negative for fever and unexpected weight change.  HENT: Negative for congestion and dental problem.   Eyes: Negative.  Negative for photophobia and visual disturbance.  Respiratory: Positive for shortness of breath. Negative for chest tightness and wheezing.   Cardiovascular: Negative for chest pain.  Gastrointestinal: Positive for constipation. Negative for rectal pain and vomiting.  Endocrine: Negative.  Negative for polydipsia, polyphagia and polyuria.  Genitourinary: Positive for menstrual problem. Negative for dyspareunia and dysuria.       Heavy menstrual cycles  Musculoskeletal: Positive for arthralgias and myalgias (Bilateral knee pain).  Skin: Negative.   Allergic/Immunologic: Negative.  Negative for immunocompromised state.  Neurological: Negative.   Hematological: Negative.   Psychiatric/Behavioral: Negative.  Negative for sleep disturbance and suicidal ideas.       Objective:   Physical Exam  Constitutional: She is oriented to person, place, and time. She appears well-developed and well-nourished.  HENT:  Head: Normocephalic and atraumatic.  Right Ear: External ear normal.  Left Ear: External ear normal.  Eyes: Pupils are equal, round, and reactive to light. Conjunctivae and EOM are normal.  Neck: Normal range of motion. Neck supple. No thyroid mass present.  Cardiovascular: Normal rate, regular rhythm, normal heart sounds and intact distal pulses.   2 + bilateral lower extremity edema  Pulmonary/Chest: Effort  normal and breath sounds normal.  Abdominal: Soft. Bowel sounds are normal.  Increased abdominal girth  Musculoskeletal:       Right knee: She exhibits no swelling.       Left knee: She exhibits no swelling.  Neurological: She is alert and oriented to person, place, and time. She has normal reflexes.  Skin: Skin is warm and dry.  Psychiatric: She has a  normal mood and affect. Her behavior is normal. Judgment and thought content normal.      BP 138/62 (BP Location: Left Arm, Patient Position: Sitting, Cuff Size: Large) Comment: manually  Pulse (!) 104   Temp 98.4 F (36.9 C) (Oral)   Resp 18   Ht 5\' 4"  (1.626 m)   Wt 270 lb (122.5 kg)   LMP 06/11/2017   SpO2 100%   BMI 46.35 kg/m  Assessment & Plan:  1. Type 2 diabetes mellitus without complication, with long-term current use of insulin (HCC) Hemoglobin a1C is  7.5. No medication changes warranted. . Will continue Humalog 70/25 units twice daily.  2. Essential hypertension Blood pressure is at goal on current medication regimen. I will continue medications at current dosages  3. Anemia, unspecified type - CBC with Differential  4. Morbid obesity ([facility]CC) Jill Shaw has had consistent weight gain. She has attempted multiple interventions to decrease weight. She is unable to exercise consistently due to increased pain to lower extremities and shortness of breath. Recently sent a referral to bariatric surgery for consult. Patient is a candidate or bariatric surgery and is scheduled to follow up.  Recommend a lowfat, low carbohydrate diet divided over 5-6 small meals, increase water intake to 6-8 glasses, and 150 minutes per week of cardiovascular exercise.   RTC: 3 months for chronic conditions   Mariza Bourget Rennis PettyMoore Garrin Kirwan  MSN, FNP-C [facility] [address]  [place], [place] [address] [phone]

## 2017-06-21 ENCOUNTER — Other Ambulatory Visit: Payer: Self-pay | Admitting: Family Medicine

## 2017-06-21 ENCOUNTER — Telehealth: Payer: Self-pay | Admitting: Family Medicine

## 2017-06-21 DIAGNOSIS — K5900 Constipation, unspecified: Secondary | ICD-10-CM

## 2017-06-21 DIAGNOSIS — E119 Type 2 diabetes mellitus without complications: Secondary | ICD-10-CM

## 2017-06-21 DIAGNOSIS — I10 Essential (primary) hypertension: Secondary | ICD-10-CM

## 2017-06-21 DIAGNOSIS — Z794 Long term (current) use of insulin: Secondary | ICD-10-CM

## 2017-06-21 DIAGNOSIS — R0981 Nasal congestion: Secondary | ICD-10-CM

## 2017-06-21 LAB — CBC WITH DIFFERENTIAL/PLATELET
Basophils Absolute: 59 cells/uL (ref 0–200)
Basophils Relative: 0.6 %
EOS ABS: 248 {cells}/uL (ref 15–500)
Eosinophils Relative: 2.5 %
HEMATOCRIT: 33.2 % — AB (ref 35.0–45.0)
Hemoglobin: 10 g/dL — ABNORMAL LOW (ref 11.7–15.5)
LYMPHS ABS: 2287 {cells}/uL (ref 850–3900)
MCH: 23.3 pg — AB (ref 27.0–33.0)
MCHC: 30.1 g/dL — ABNORMAL LOW (ref 32.0–36.0)
MCV: 77.2 fL — AB (ref 80.0–100.0)
Monocytes Relative: 5.7 %
NEUTROS PCT: 68.1 %
Neutro Abs: 6742 cells/uL (ref 1500–7800)
Platelets: 258 10*3/uL (ref 140–400)
RBC: 4.3 10*6/uL (ref 3.80–5.10)
RDW: 22.3 % — AB (ref 11.0–15.0)
TOTAL LYMPHOCYTE: 23.1 %
WBC mixed population: 564 cells/uL (ref 200–950)
WBC: 9.9 10*3/uL (ref 3.8–10.8)

## 2017-06-21 MED ORDER — INSULIN LISPRO PROT & LISPRO (75-25 MIX) 100 UNIT/ML ~~LOC~~ SUSP: SUBCUTANEOUS | 0 refills | Status: DC

## 2017-06-21 MED ORDER — DOCUSATE SODIUM 100 MG PO CAPS: 100.0000 mg | ORAL_CAPSULE | Freq: Every day | ORAL | 1 refills | Status: DC

## 2017-06-21 MED ORDER — POTASSIUM CHLORIDE ER 10 MEQ PO TBCR: 10.0000 meq | EXTENDED_RELEASE_TABLET | Freq: Every day | ORAL | 0 refills | Status: DC

## 2017-06-21 MED ORDER — VITAMIN D (ERGOCALCIFEROL) 1.25 MG (50000 UNIT) PO CAPS: 50000.0000 [IU] | ORAL_CAPSULE | ORAL | 3 refills | Status: DC

## 2017-06-21 MED ORDER — OMEPRAZOLE 40 MG PO CPDR: 40.0000 mg | DELAYED_RELEASE_CAPSULE | Freq: Every day | ORAL | 3 refills | Status: DC

## 2017-06-21 MED ORDER — HYDROCHLOROTHIAZIDE 12.5 MG PO TABS: ORAL_TABLET | ORAL | 0 refills | Status: DC

## 2017-06-21 MED ORDER — TIZANIDINE HCL 6 MG PO CAPS: 6.0000 mg | ORAL_CAPSULE | Freq: Three times a day (TID) | ORAL | 0 refills | Status: DC

## 2017-06-21 MED ORDER — LOSARTAN POTASSIUM 50 MG PO TABS: 50.0000 mg | ORAL_TABLET | Freq: Every day | ORAL | 3 refills | Status: DC

## 2017-06-21 MED ORDER — IRON 325 (65 FE) MG PO TABS: 325.0000 mg | ORAL_TABLET | Freq: Every day | ORAL | 0 refills | Status: DC

## 2017-06-21 MED ORDER — FLUTICASONE PROPIONATE 50 MCG/ACT NA SUSP: 2.0000 | Freq: Every day | NASAL | 6 refills | Status: DC

## 2017-06-21 MED ORDER — SAXAGLIPTIN HCL 2.5 MG PO TABS: 2.5000 mg | ORAL_TABLET | Freq: Every day | ORAL | 3 refills | Status: DC

## 2017-06-21 MED ORDER — BUDESONIDE-FORMOTEROL FUMARATE 80-4.5 MCG/ACT IN AERO: 2.0000 | INHALATION_SPRAY | Freq: Two times a day (BID) | RESPIRATORY_TRACT | 5 refills | Status: DC

## 2017-06-21 NOTE — Telephone Encounter (Signed)
Refills sent into pharmacy. Thanks!  

## 2017-06-21 NOTE — Telephone Encounter (Signed)
Patient Jill Shaw stating that she needs a refill on all her meds. Patient uses the [facility] [address]. Please advise.

## 2017-06-24 NOTE — Patient Instructions (Signed)
Obesity, Adult Obesity is having too much body fat. If you have a BMI of 30 or more, you are obese. BMI is a number that explains how much body fat you have. Obesity is often caused by taking in (consuming) more calories than your body uses. Obesity can cause serious health problems. Changing your lifestyle can help to treat obesity. Follow these instructions at home: Eating and drinking   Follow advice from your doctor about what to eat and drink. Your doctor may tell you to: ? Cut down on (limit) fast foods, sweets, and processed snack foods. ? Choose low-fat options. For example, choose low-fat milk instead of whole milk. ? Eat 5 or more servings of fruits or vegetables every day. ? Eat at home more often. This gives you more control over what you eat. ? Choose healthy foods when you eat out. ? Learn what a healthy portion size is. A portion size is the amount of a certain food that is healthy for you to eat at one time. This is different for each person. ? Keep low-fat snacks available. ? Avoid sugary drinks. These include soda, fruit juice, iced tea that is sweetened with sugar, and flavored milk. ? Eat a healthy breakfast.  Drink enough water to keep your pee (urine) clear or pale yellow.  Do not go without eating for long periods of time (do not fast).  Do not go on popular or trendy diets (fad diets). Physical Activity  Exercise often, as told by your doctor. Ask your doctor: ? What types of exercise are safe for you. ? How often you should exercise.  Warm up and stretch before being active.  Do slow stretching after being active (cool down).  Rest between times of being active. Lifestyle  Limit how much time you spend in front of your TV, computer, or video game system (be less sedentary).  Find ways to reward yourself that do not involve food.  Limit alcohol intake to no more than 1 drink a day for nonpregnant women and 2 drinks a day for men. One drink equals 12 oz  of beer, 5 oz of wine, or 1 oz of hard liquor. General instructions  Keep a weight loss journal. This can help you keep track of: ? The food that you eat. ? The exercise that you do.  Take over-the-counter and prescription medicines only as told by your doctor.  Take vitamins and supplements only as told by your doctor.  Think about joining a support group. Your doctor may be able to help with this.  Keep all follow-up visits as told by your doctor. This is important. Contact a doctor if:  You cannot meet your weight loss goal after you have changed your diet and lifestyle for 6 weeks. This information is not intended to replace advice given to you by your health care provider. Make sure you discuss any questions you have with your health care provider. Document Released: 12/25/2011 Document Revised: 03/09/2016 Document Reviewed: 07/21/2015 Elsevier Interactive Patient Education  2018 Elsevier Inc.  

## 2017-06-27 ENCOUNTER — Encounter: Payer: Medicaid Other | Admitting: Family Medicine

## 2017-06-27 ENCOUNTER — Encounter: Payer: Self-pay | Admitting: Family Medicine

## 2017-06-27 ENCOUNTER — Encounter: Payer: Self-pay | Admitting: General Practice

## 2017-06-27 NOTE — Progress Notes (Unsigned)
Patient did not keep appointment today. She may call to reschedule.  

## 2017-07-02 ENCOUNTER — Encounter: Payer: Self-pay | Admitting: Podiatry

## 2017-07-02 ENCOUNTER — Telehealth: Payer: Self-pay | Admitting: *Deleted

## 2017-07-02 ENCOUNTER — Ambulatory Visit (INDEPENDENT_AMBULATORY_CARE_PROVIDER_SITE_OTHER): Payer: Medicaid Other | Admitting: Podiatry

## 2017-07-02 DIAGNOSIS — M76821 Posterior tibial tendinitis, right leg: Secondary | ICD-10-CM

## 2017-07-02 MED ORDER — HYDROCODONE-ACETAMINOPHEN 5-325 MG PO TABS: 1.0000 | ORAL_TABLET | Freq: Four times a day (QID) | ORAL | 0 refills | Status: DC | PRN

## 2017-07-02 NOTE — Telephone Encounter (Addendum)
-----   Message from Felecia Shelling, DPM sent at 07/02/2017  3:13 PM EDT ----- Regarding: MRI right ankle Please order MRI right ankle with or without contrast  Diagnosis: Posterior tibial tendinitis right  Thanks, Dr. Logan Bores.

## 2017-07-03 NOTE — Telephone Encounter (Addendum)
-----   Message from Felecia Shelling, DPM sent at 07/02/2017  3:13 PM EDT ----- Regarding: MRI right ankle Please order MRI right ankle with or without contrast  Diagnosis: Posterior tibial tendinitis right  Thanks, Dr. Logan Bores. [employer] requires clinicals for prior authorization.07/05/2017-Received 07/02/2017 clinicals and faxed to [employer].

## 2017-07-04 NOTE — Progress Notes (Signed)
   HPI: 41 year old female presents to office today for follow-up treatment and evaluation regarding posterior tibial tendinitis to the right lower extremity. She states her symptoms have not improved. She states she cannot wear the boot because it causes swelling to the foot and the RLE and is not helping. She has been taking the Lortab which does help alleviate her pain. Patient presents today for further treatment and evaluation   Physical Exam: General: The patient is alert and oriented x3 in no acute distress.  Dermatology: Skin is warm, dry and supple bilateral lower extremities. Negative for open lesions or macerations.  Vascular: Palpable pedal pulses bilaterally. No edema or erythema noted. Capillary refill within normal limits.  Neurological: Epicritic and protective threshold grossly intact bilaterally.   Musculoskeletal Exam: Pain on palpation noted along the posterior tibial tendon of the right lower extremity. Range of motion within normal limits to all pedal and ankle joints bilateral. Muscle strength 5/5 in all groups bilateral.   Assessment: 1. Posterior tibial tendinitis right lower extremity 2. Bilateral flatfoot deformity   Plan of Care:  1. Patient was evaluated. 2. MRI of right ankle due to non-progression of symptoms.  3. Continue wearing CAM boot; weightbearing as tolerated. 4. Refill prescription for Lortab 7.5/325 mg #30 given to patient.  5. Return to clinic in 4 weeks.  Felecia Shelling, DPM [facility]  Dr. Felecia Shelling, DPM    [address] [place], [place] [address]                Office [phone]  Fax [phone]

## 2017-07-06 ENCOUNTER — Telehealth: Payer: Self-pay

## 2017-07-06 ENCOUNTER — Telehealth: Payer: Self-pay | Admitting: Podiatry

## 2017-07-06 NOTE — Telephone Encounter (Signed)
I was calling to see if I can get paperwork to get a handicap placard.

## 2017-07-09 ENCOUNTER — Telehealth: Payer: Self-pay | Admitting: Podiatry

## 2017-07-09 NOTE — Telephone Encounter (Signed)
Dr. Philomena Doheny patient to have 3 month handicap placard. Form is filled out and available for patient to pick up in [place] office. Left voicemail for patient to come pick up paperwork.

## 2017-07-09 NOTE — Telephone Encounter (Signed)
Called no answer. Left a message for patient to call back if needed. Thanks!

## 2017-07-11 ENCOUNTER — Telehealth: Payer: Self-pay

## 2017-07-11 DIAGNOSIS — M79676 Pain in unspecified toe(s): Secondary | ICD-10-CM

## 2017-07-11 NOTE — Telephone Encounter (Signed)
LVM returning patients call.

## 2017-07-12 ENCOUNTER — Telehealth: Payer: Self-pay | Admitting: Podiatry

## 2017-07-12 NOTE — Telephone Encounter (Signed)
This is Jill Shaw calling. If you could give me a call back as soon as possible please at [phone]. Thank you. Bye.

## 2017-07-16 ENCOUNTER — Telehealth: Payer: Self-pay

## 2017-07-16 HISTORY — PX: UPPER GI ENDOSCOPY: SHX6162

## 2017-07-16 NOTE — Telephone Encounter (Signed)
Called patient, she states that she has been having a period for 7 days and that she wanted [place] to call in something to stop it. I advised her that she would need an appointment. She has an appointment set up with GYN for 07/23/2017, I informed her to call their office and let them know what is going on and maybe they can get her in sooner if need be. Patient verbalized understanding. Thanks!

## 2017-07-19 ENCOUNTER — Other Ambulatory Visit: Payer: Self-pay | Admitting: Family Medicine

## 2017-07-19 DIAGNOSIS — E119 Type 2 diabetes mellitus without complications: Secondary | ICD-10-CM

## 2017-07-19 DIAGNOSIS — Z794 Long term (current) use of insulin: Principal | ICD-10-CM

## 2017-07-23 ENCOUNTER — Encounter: Payer: Self-pay | Admitting: Obstetrics & Gynecology

## 2017-07-23 ENCOUNTER — Ambulatory Visit: Payer: Medicaid - Out of State | Admitting: Family Medicine

## 2017-07-25 ENCOUNTER — Ambulatory Visit (INDEPENDENT_AMBULATORY_CARE_PROVIDER_SITE_OTHER): Payer: Medicaid Other | Admitting: Obstetrics & Gynecology

## 2017-07-25 ENCOUNTER — Other Ambulatory Visit (HOSPITAL_COMMUNITY)
Admission: RE | Admit: 2017-07-25 | Discharge: 2017-07-25 | Disposition: A | Discharge status: Home / Self Care | Payer: Medicaid Other | Source: Ambulatory Visit | Attending: Obstetrics & Gynecology | Admitting: Obstetrics & Gynecology

## 2017-07-25 ENCOUNTER — Encounter: Payer: Self-pay | Admitting: Obstetrics & Gynecology

## 2017-07-25 VITALS — BP 172/95 | HR 104 | Wt 272.2 lb

## 2017-07-25 DIAGNOSIS — Z124 Encounter for screening for malignant neoplasm of cervix: Secondary | ICD-10-CM | POA: Insufficient documentation

## 2017-07-25 DIAGNOSIS — N921 Excessive and frequent menstruation with irregular cycle: Secondary | ICD-10-CM

## 2017-07-25 DIAGNOSIS — Z1231 Encounter for screening mammogram for malignant neoplasm of breast: Secondary | ICD-10-CM

## 2017-07-25 DIAGNOSIS — Z3202 Encounter for pregnancy test, result negative: Secondary | ICD-10-CM

## 2017-07-25 NOTE — Addendum Note (Signed)
Addended by: Faythe Casa on: 07/25/2017 04:50 PM   Modules accepted: Orders

## 2017-07-25 NOTE — Progress Notes (Signed)
[facility] for [facility]th @ 1330.  Pt notified.

## 2017-07-25 NOTE — Progress Notes (Signed)
Patient ID: Jill Shaw, female   DOB: 09/16/1976, 41 y.o.   MRN: [id number]  Chief Complaint  Patient presents with  . Menorrhagia    HPI Jill Shaw is a 40 y.o. female.   HPI 41 yo  S B P3 (88, 39 yo twin boys) here today with the issue heavy periods with anemia (hbg 10 recently). An u/s done showed a thickened endometrium. It has been heavy for about 6 years. She uses BTL for  Contraception. Her periods are sometimes painful, sometimes with sex. Past Medical History:  Diagnosis Date  . Arthritis   . Diabetes mellitus without complication (HCC)   . Hypertension   . Sickle cell trait [facility] [place])     Past Surgical History:  Procedure Laterality Date  . CESAREAN SECTION      Family History  Problem Relation Age of Onset  . Diabetes Mother   . Hypertension Mother     Social History Social History  Substance Use Topics  . Smoking status: Current Every Day Smoker    Packs/day: 0.25    Types: Cigarettes  . Smokeless tobacco: Never Used  . Alcohol use No   She is unemployed, waiting for disability Monogamous for 8 years  Allergies  Allergen Reactions  . Aspirin Nausea Only  . Cortizone-10 [Hydrocortisone] Nausea Only  . Gabapentin Nausea And Vomiting    upset stomach  . Ibuprofen Nausea Only and Nausea And Vomiting  . Ketoprofen Nausea And Vomiting  . Liraglutide Nausea And Vomiting  . Naproxen Nausea And Vomiting  . Omeprazole-Sodium Bicarbonate Nausea And Vomiting  . Sulfa Antibiotics Nausea And Vomiting  . Tramadol Nausea And Vomiting    stomach upset    Current Outpatient Prescriptions  Medication Sig Dispense Refill  . albuterol (PROVENTIL HFA;VENTOLIN HFA) 108 (90 Base) MCG/ACT inhaler Inhale 2 puffs into the lungs every 6 (six) hours as needed for wheezing or shortness of breath. 1 Inhaler 0  . Blood Glucose Monitoring Suppl (TRUE METRIX METER) w/Device KIT 1 each by Does not apply route 4 (four) times daily -  before meals and at bedtime. 1 kit 0  .  budesonide-formoterol (SYMBICORT) 80-4.5 MCG/ACT inhaler Inhale 2 puffs into the lungs 2 (two) times daily. 1 Inhaler 5  . docusate sodium (COLACE) 100 MG capsule Take 1 capsule (100 mg total) by mouth daily. 30 capsule 1  . Ferrous Sulfate (IRON) 325 (65 Fe) MG TABS Take 1 tablet (325 mg total) by mouth daily. 30 each 0  . fluticasone (FLONASE) 50 MCG/ACT nasal spray Place 2 sprays into both nostrils daily. 16 g 6  . glucose blood (FREESTYLE TEST STRIPS) test strip Use as instructed 100 each 12  . glucose blood (TRUE METRIX BLOOD GLUCOSE TEST) test strip Use as instructed 100 each 12  . HUMALOG MIX 75/25 (75-25) 100 UNIT/ML SUSP injection INJECT 70 UNITS UNDER THE SKIN TWICE DAILY WITH MEALS 40 mL 0  . hydrochlorothiazide (HYDRODIURIL) 12.5 MG tablet TAKE 2 TABLETS(25 MG) BY MOUTH DAILY 90 tablet 0  . HYDROcodone-acetaminophen (NORCO/VICODIN) 5-325 MG tablet Take 1 tablet by mouth every 6 (six) hours as needed for moderate pain. 30 tablet 0  . insulin lispro protamine-lispro (HUMALOG MIX 75/25) (75-25) 100 UNIT/ML SUSP injection INJECT 70 UNITS UNDER THE SKIN TWICE DAILY WITH A MEAL 40 mL 0  . losartan (COZAAR) 50 MG tablet Take 1 tablet (50 mg total) by mouth daily. 90 tablet 3  . omeprazole (PRILOSEC) 40 MG capsule Take 1 capsule (40 mg total) by  mouth daily. 30 capsule 3  . potassium chloride (K-DUR) 10 MEQ tablet Take 1 tablet (10 mEq total) by mouth daily. 30 tablet 0  . pregabalin (LYRICA) 75 MG capsule Take 1 capsule (75 mg total) by mouth 2 (two) times daily. 60 capsule 0  . saxagliptin HCl (ONGLYZA) 2.5 MG TABS tablet Take 1 tablet (2.5 mg total) by mouth daily. 90 tablet 3  . tizanidine (ZANAFLEX) 6 MG capsule Take 1 capsule (6 mg total) by mouth 3 (three) times daily. 90 capsule 0  . Vitamin D, Ergocalciferol, (DRISDOL) 50000 units CAPS capsule Take 1 capsule (50,000 Units total) by mouth every 7 (seven) days. 30 capsule 3   Current Facility-Administered Medications  Medication Dose  Route Frequency Provider Last Rate Last Dose  . betamethasone acetate-betamethasone sodium phosphate (CELESTONE) injection 3 mg  3 mg Intramuscular Once Edrick Kins, DPM        Review of Systems Review of Systems  Blood pressure (!) 172/95, pulse (!) 104, weight 272 lb 3.2 oz (123.5 kg).  Physical Exam Physical Exam Pleasant morbidly obese Black female No distress Breathing, conversing, and ambulating normally  UPT negative, consent signed, time out done Cervix prepped with betadine and grasped with a single tooth tenaculum Uterus sounded to 9 cm Pipelle used for 2 passes with a moderate amount of tissue obtained. She tolerated the procedure well.   Data Reviewed  IMPRESSION: Fluid and debris noted within the endometrial canal. Focal endometrial lesion cannot be excluded. Consider further evaluation with sonohysterogram for confirmation prior to hysteroscopy. Endometrial sampling should also be considered if patient is at high risk for endometrial carcinoma. (Ref: Radiological Reasoning: Algorithmic Workup of Abnormal Vaginal Bleeding with Endovaginal Sonography and Sonohysterography. AJR 2008; 846:K59-93) Assessment   menorrhagia Anemia Thickened endometrium     Plan    Await pathology report RTC 2 weeks   mammogram    Jackob Crookston C Kennon Encinas 07/25/2017, 4:05 PM

## 2017-07-26 LAB — POCT PREGNANCY, URINE: Preg Test, Ur: NEGATIVE

## 2017-07-27 LAB — CYTOLOGY - PAP
Diagnosis: NEGATIVE
HPV: NOT DETECTED

## 2017-07-28 ENCOUNTER — Inpatient Hospital Stay: Admission: RE | Admit: 2017-07-28 | Payer: Self-pay | Source: Ambulatory Visit

## 2017-07-31 ENCOUNTER — Telehealth: Payer: Self-pay | Admitting: General Practice

## 2017-07-31 NOTE — Telephone Encounter (Signed)
Patient called and left message requesting test results. Called patient, no answer- left message stating we are trying to reach you to return your phone call. The results you have requested are negative. You may call us back if you have questions

## 2017-08-01 ENCOUNTER — Telehealth: Payer: Self-pay | Admitting: Podiatry

## 2017-08-01 ENCOUNTER — Ambulatory Visit
Admission: RE | Admit: 2017-08-01 | Discharge: 2017-08-01 | Disposition: A | Discharge status: Home / Self Care | Payer: Medicaid Other | Source: Ambulatory Visit | Attending: Podiatry | Admitting: Podiatry

## 2017-08-01 MED FILL — $HUMALOG MIX 75-25 KWIKPEN: (75-25) 100 | 30 days supply | Qty: 45 | Fill #2

## 2017-08-02 MED ORDER — HYDROCODONE-ACETAMINOPHEN 5-325 MG PO TABS: 1.0000 | ORAL_TABLET | Freq: Four times a day (QID) | ORAL | 0 refills | Status: DC | PRN

## 2017-08-02 NOTE — Addendum Note (Signed)
Addended by: Alphia Kava'CONNELL, Suzann Lazaro D on: 08/02/2017 10:19 AM   Modules accepted: Orders

## 2017-08-02 NOTE — Telephone Encounter (Signed)
Left message informing pt Dr. Logan BoresEvans had wanted to see her 4 weeks after her 07/02/2017 appt and had ordered #15 of the pain medication she had requested. I also left message to call for an appt [phone].

## 2017-08-02 NOTE — Telephone Encounter (Signed)
I was calling to get my medication refilled. You can reach me at [phone]. Thank you. Bye.

## 2017-08-06 ENCOUNTER — Other Ambulatory Visit: Payer: Self-pay | Admitting: Family Medicine

## 2017-08-09 ENCOUNTER — Ambulatory Visit: Payer: Medicaid Other

## 2017-08-09 DIAGNOSIS — M79676 Pain in unspecified toe(s): Secondary | ICD-10-CM

## 2017-08-10 ENCOUNTER — Ambulatory Visit: Payer: Medicaid Other | Admitting: Family Medicine

## 2017-08-13 ENCOUNTER — Ambulatory Visit: Payer: Medicaid Other | Admitting: Family Medicine

## 2017-08-15 ENCOUNTER — Ambulatory Visit (INDEPENDENT_AMBULATORY_CARE_PROVIDER_SITE_OTHER): Payer: Medicaid Other | Admitting: Podiatry

## 2017-08-15 ENCOUNTER — Encounter: Payer: Self-pay | Admitting: Podiatry

## 2017-08-15 DIAGNOSIS — M7751 Other enthesopathy of right foot: Secondary | ICD-10-CM

## 2017-08-15 DIAGNOSIS — M25571 Pain in right ankle and joints of right foot: Secondary | ICD-10-CM

## 2017-08-16 ENCOUNTER — Telehealth: Payer: Self-pay | Admitting: Obstetrics & Gynecology

## 2017-08-16 NOTE — Telephone Encounter (Signed)
Bleeding heavy for 8 days and want to talk to a nurse

## 2017-08-17 NOTE — Progress Notes (Signed)
   HPI: 41 year old female presents to office today for follow-up for right foot pain. She recently had an MRI and is here for the results. She denies any new complaints at this time. Patient presents today for further treatment and evaluation.    Past Medical History:  Diagnosis Date  . Arthritis   . Diabetes mellitus without complication (HCC)   . Hypertension   . Sickle cell trait [facility](HCC)       Physical Exam: General: The patient is alert and oriented x3 in no acute distress.  Dermatology: Skin is warm, dry and supple bilateral lower extremities. Negative for open lesions or macerations.  Vascular: Palpable pedal pulses bilaterally. No edema or erythema noted. Capillary refill within normal limits.  Neurological: Epicritic and protective threshold grossly intact bilaterally.   Musculoskeletal Exam: Pain on palpation noted along the posterior tibial tendon of the right lower extremity. Is limited range of motion noted with inversion and eversion of the subtalar joint right lower extremity. Muscle strength 5/5 in all groups bilateral.   MRI results:  1. 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 2. Generalized soft tissue edema about the ankle and included foot. 3. No tendinopathy or acute ligamentous pathology crossing the ankle joint. 4. Small plantar calcaneal enthesophyte.  Assessment: 1. Subtalar joint coalition-middle facet right   Plan of Care:  1. Patient was evaluated. MRI reviewed.  2. Injection of 0.5 mLs Celestone Soluspan injected into the sinus tarsi of the right foot. 3. Appt with Raif[facility] Nobleick for custom molded orthotics. 4. Return to clinic after one month of orthotic use.  Felecia ShellingBrent M. Margarine Grosshans, DPM [facility]  Dr. Felecia ShellingBrent M. Khaleah Duer, DPM    [address]                                        [place], [place] [id number]                Office [phone]  [phone]  Fax [phone]

## 2017-08-22 MED ORDER — BETAMETHASONE SOD PHOS & ACET 6 (3-3) MG/ML IJ SUSP: 3.0000 mg | Freq: Once | INTRAMUSCULAR | Status: DC

## 2017-08-24 ENCOUNTER — Telehealth: Payer: Self-pay | Admitting: Podiatry

## 2017-08-24 MED ORDER — HYDROCODONE-ACETAMINOPHEN 7.5-325 MG PO TABS: 1.0000 | ORAL_TABLET | Freq: Four times a day (QID) | ORAL | 0 refills | Status: DC | PRN

## 2017-08-24 NOTE — Telephone Encounter (Signed)
Left message informing pt of Dr. Logan BoresEvans refill order [facility] to 4:00pm today.

## 2017-08-24 NOTE — Telephone Encounter (Signed)
I saw Dr. Logan BoresEvans the other week and he gave me an injection in my foot. The shot is not working and its also like numbing up my foot. So, basically, I don't like the shot as its causing me more pain in my foot and I just want to go back on my pills I was on. If you would call me back at [phone]. Thank you.

## 2017-08-24 NOTE — Telephone Encounter (Signed)
Yes, please refill Lortab 7.5/325mg  for now. #30. Q6h.   Thanks, Dr. Logan BoresEvans

## 2017-08-24 NOTE — Addendum Note (Signed)
Addended by: Alphia Kava'CONNELL, Cade Olberding D on: 08/24/2017 03:30 PM   Modules accepted: Orders

## 2017-08-27 ENCOUNTER — Ambulatory Visit: Payer: Medicaid Other | Admitting: Orthotics

## 2017-08-27 DIAGNOSIS — M76821 Posterior tibial tendinitis, right leg: Secondary | ICD-10-CM

## 2017-08-27 DIAGNOSIS — M25571 Pain in right ankle and joints of right foot: Secondary | ICD-10-CM

## 2017-08-27 NOTE — Progress Notes (Signed)
Patient is going to return in January for an appointment due to financial considerations (Medicaid); hopefully, [facility] will have Medicaid certification for DME by then.

## 2017-08-29 ENCOUNTER — Encounter (HOSPITAL_COMMUNITY): Payer: Self-pay

## 2017-08-29 ENCOUNTER — Ambulatory Visit: Payer: Medicaid Other | Admitting: Obstetrics & Gynecology

## 2017-08-29 ENCOUNTER — Encounter: Payer: Self-pay | Admitting: Obstetrics & Gynecology

## 2017-08-29 VITALS — BP 183/81 | HR 110 | Wt 270.4 lb

## 2017-08-29 DIAGNOSIS — N921 Excessive and frequent menstruation with irregular cycle: Secondary | ICD-10-CM

## 2017-08-29 NOTE — Progress Notes (Signed)
   Subjective:    Patient ID: Jill Shaw, female    DOB: 07/03/1976, 41 y.o.   MRN: [id number]  HPI 41 yo MAA LP3 here for discussion of her menorrhagia. She had an unremarkable u/s except for the thickness of the lining. I did an EMBX which was normal. She had a vaginal delivery and then a c/s for her twins. She has had a BTL.  Ideally, she would like a hysterectomy. Her hbg was recently 10 but has been down to 8.   Review of Systems     Objective:   Physical Exam Breathing, conversing, and ambulating normally Well nourished, well hydrated Black female, no apparent distress NO prolapse, moderate vaginal arch      Assessment & Plan:  Menorrhagia, anemia- While she would like a hysterectomy, I think that an albation or IUD would certainly be less risky for her. After discussing her options, she has decided that she would like an ablation. I sent Jill Shaw a message to schedule this. She will call her internal medicine doctor today to get an appt to get her BP under control. She is aware that if her BP is uncontrolled on the day of surgery, that her surgery will most likely be canceled.

## 2017-08-31 ENCOUNTER — Ambulatory Visit (INDEPENDENT_AMBULATORY_CARE_PROVIDER_SITE_OTHER): Payer: Medicaid Other | Admitting: Family Medicine

## 2017-08-31 ENCOUNTER — Encounter: Payer: Self-pay | Admitting: Family Medicine

## 2017-08-31 VITALS — BP 140/64 | HR 106 | Temp 98.7°F | Resp 16 | Ht 64.0 in | Wt 270.0 lb

## 2017-08-31 DIAGNOSIS — R600 Localized edema: Secondary | ICD-10-CM

## 2017-08-31 DIAGNOSIS — R8290 Unspecified abnormal findings in urine: Secondary | ICD-10-CM | POA: Diagnosis not present

## 2017-08-31 DIAGNOSIS — I10 Essential (primary) hypertension: Secondary | ICD-10-CM | POA: Diagnosis not present

## 2017-08-31 LAB — POCT URINALYSIS DIP (DEVICE)
Bilirubin Urine: NEGATIVE
Glucose, UA: NEGATIVE mg/dL
Ketones, ur: NEGATIVE mg/dL
NITRITE: NEGATIVE
PH: 5.5 (ref 5.0–8.0)
PROTEIN: NEGATIVE mg/dL
Specific Gravity, Urine: 1.02 (ref 1.005–1.030)
UROBILINOGEN UA: 1 mg/dL (ref 0.0–1.0)

## 2017-08-31 LAB — BASIC METABOLIC PANEL WITH GFR
BUN / CREAT RATIO: 14 (calc) (ref 6–22)
BUN: 17 mg/dL (ref 7–25)
CO2: 29 mmol/L (ref 20–32)
Calcium: 9.7 mg/dL (ref 8.6–10.2)
Chloride: 98 mmol/L (ref 98–110)
Creat: 1.18 mg/dL — ABNORMAL HIGH (ref 0.50–1.10)
GFR, EST AFRICAN AMERICAN: 67 mL/min/{1.73_m2} (ref >=60)
GFR, EST NON AFRICAN AMERICAN: 58 mL/min/{1.73_m2} — AB (ref >=60)
Glucose, Bld: 97 mg/dL (ref 65–99)
POTASSIUM: 3.7 mmol/L (ref 3.5–5.3)
SODIUM: 137 mmol/L (ref 135–146)

## 2017-08-31 MED ORDER — HYDROCHLOROTHIAZIDE 25 MG PO TABS: ORAL_TABLET | ORAL | 1 refills | Status: DC

## 2017-09-01 ENCOUNTER — Other Ambulatory Visit: Payer: Self-pay | Admitting: Family Medicine

## 2017-09-01 DIAGNOSIS — R7989 Other specified abnormal findings of blood chemistry: Secondary | ICD-10-CM

## 2017-09-01 NOTE — Progress Notes (Unsigned)
Jill Shaw is a 41 year old female with a history of uncontrolled diabetes mellitus type 2 and hypertension presented on 08/31/2017 for follow-up appointment was found to have an elevated serum creatinine level.  Will repeat basic metabolic panel with GFR and hemoglobin A1c in 2 weeks

## 2017-09-03 ENCOUNTER — Other Ambulatory Visit: Payer: Self-pay | Admitting: Family Medicine

## 2017-09-03 ENCOUNTER — Telehealth: Payer: Self-pay

## 2017-09-03 DIAGNOSIS — B9689 Other specified bacterial agents as the cause of diseases classified elsewhere: Secondary | ICD-10-CM

## 2017-09-03 DIAGNOSIS — B961 Klebsiella pneumoniae [K. pneumoniae] as the cause of diseases classified elsewhere: Principal | ICD-10-CM

## 2017-09-03 DIAGNOSIS — N390 Urinary tract infection, site not specified: Secondary | ICD-10-CM

## 2017-09-03 LAB — URINE CULTURE
MICRO NUMBER:: 81295610
SPECIMEN QUALITY:: ADEQUATE

## 2017-09-03 MED ORDER — AMOXICILLIN-POT CLAVULANATE 875-125 MG PO TABS: 1.0000 | ORAL_TABLET | Freq: Two times a day (BID) | ORAL | 0 refills | Status: AC

## 2017-09-03 NOTE — Telephone Encounter (Signed)
-----   Message from Massie MaroonLachina M Hollis, [place]NP sent at 09/01/2017  7:04 AM EST ----- Regarding: lab results Please inform patient that creatinine level is elevated.  Creatinine level is an indicator of overall kidney functioning and also GFR is decreased.  Remind patient to take antihypertensive medications consistently, will maintain tight control of blood pressure.  Have her schedule a lab appointment in 2 weeks to recheck creatinine and she will also need a hemoglobin A1c. Thanks

## 2017-09-03 NOTE — Telephone Encounter (Signed)
Please inform patient that she has a urinary tract infection. Will start Augmentin 875-125 mg every 12 hours for 7 days. Increase water intake to 6-8 glasses, wipe from front to back and practice good perianal hygiene.   Called and spoke with patient. Advised that creatine level is elevated and that she needs to take blood pressure medications consistently every day and maintain tight control. Advised that she also had a urinary tract infection and that we have prescribed augmentin to take 1 tablet every 12 hours for 7 days. Informed patient to make sure she is wiping from front to back when using the restroom and to increase her water intake. Appointment has been scheduled for repeat labs on the creatine in 2 weeks. Thanks!

## 2017-09-03 NOTE — Progress Notes (Signed)
Meds ordered this encounter  Medications  . amoxicillin-clavulanate (AUGMENTIN) 875-125 MG tablet    Sig: Take 1 tablet 2 (two) times daily for 7 days by mouth.    Dispense:  14 tablet    Refill:  0

## 2017-09-03 NOTE — Patient Instructions (Signed)
We will increase hydrochlorothiazide to 25 mg daily.  Will continue losartan 50 mg daily.  Return in 1 week for blood pressure check. DASH Eating Plan DASH stands for "Dietary Approaches to Stop Hypertension." The DASH eating plan is a healthy eating plan that has been shown to reduce high blood pressure (hypertension). It may also reduce your risk for type 2 diabetes, heart disease, and stroke. The DASH eating plan may also help with weight loss. What are tips for following this plan? General guidelines  Avoid eating more than 2,300 mg (milligrams) of salt (sodium) a day. If you have hypertension, you may need to reduce your sodium intake to 1,500 mg a day.  Limit alcohol intake to no more than 1 drink a day for nonpregnant women and 2 drinks a day for men. One drink equals 12 oz of beer, 5 oz of wine, or 1 oz of hard liquor.  Work with your health care provider to maintain a healthy body weight or to lose weight. Ask what an ideal weight is for you.  Get at least 30 minutes of exercise that causes your heart to beat faster (aerobic exercise) most days of the week. Activities may include walking, swimming, or biking.  Work with your health care provider or diet and nutrition specialist (dietitian) to adjust your eating plan to your individual calorie needs. Reading food labels  Check food labels for the amount of sodium per serving. Choose foods with less than 5 percent of the Daily Value of sodium. Generally, foods with less than 300 mg of sodium per serving fit into this eating plan.  To find whole grains, look for the word "whole" as the first word in the ingredient list. Shopping  Buy products labeled as "low-sodium" or "no salt added."  Buy fresh foods. Avoid canned foods and premade or frozen meals. Cooking  Avoid adding salt when cooking. Use salt-free seasonings or herbs instead of table salt or sea salt. Check with your health care provider or pharmacist before using salt  substitutes.  Do not fry foods. Cook foods using healthy methods such as baking, boiling, grilling, and broiling instead.  Cook with heart-healthy oils, such as olive, canola, soybean, or sunflower oil. Meal planning   Eat a balanced diet that includes: ? 5 or more servings of fruits and vegetables each day. At each meal, try to fill half of your plate with fruits and vegetables. ? Up to 6-8 servings of whole grains each day. ? Less than 6 oz of lean meat, poultry, or fish each day. A 3-oz serving of meat is about the same size as a deck of cards. One egg equals 1 oz. ? 2 servings of low-fat dairy each day. ? A serving of nuts, seeds, or beans 5 times each week. ? Heart-healthy fats. Healthy fats called Omega-3 fatty acids are found in foods such as flaxseeds and coldwater fish, like sardines, salmon, and mackerel.  Limit how much you eat of the following: ? Canned or prepackaged foods. ? Food that is high in trans fat, such as fried foods. ? Food that is high in saturated fat, such as fatty meat. ? Sweets, desserts, sugary drinks, and other foods with added sugar. ? Full-fat dairy products.  Do not salt foods before eating.  Try to eat at least 2 vegetarian meals each week.  Eat more home-cooked food and less restaurant, buffet, and fast food.  When eating at a restaurant, ask that your food be prepared with less salt  or no salt, if possible. What foods are recommended? The items listed may not be a complete list. Talk with your dietitian about what dietary choices are best for you. Grains Whole-grain or whole-wheat bread. Whole-grain or whole-wheat pasta. Brown rice. [place]ena Morrow. Bulgur. Whole-grain and low-sodium cereals. Pita bread. Low-fat, low-sodium crackers. Whole-wheat flour tortillas. Vegetables Fresh or frozen vegetables (raw, steamed, roasted, or grilled). Low-sodium or reduced-sodium tomato and vegetable juice. Low-sodium or reduced-sodium tomato sauce and tomato  paste. Low-sodium or reduced-sodium canned vegetables. Fruits All fresh, dried, or frozen fruit. Canned fruit in natural juice (without added sugar). Meat and other protein foods Skinless chicken or [place]. Ground chicken or [place]. Pork with fat trimmed off. Fish and seafood. Egg whites. Dried beans, peas, or lentils. Unsalted nuts, nut butters, and seeds. Unsalted canned beans. Lean cuts of beef with fat trimmed off. Low-sodium, lean deli meat. Dairy Low-fat (1%) or fat-free (skim) milk. Fat-free, low-fat, or reduced-fat cheeses. Nonfat, low-sodium ricotta or cottage cheese. Low-fat or nonfat yogurt. Low-fat, low-sodium cheese. Fats and oils Soft margarine without trans fats. Vegetable oil. Low-fat, reduced-fat, or light mayonnaise and salad dressings (reduced-sodium). Canola, safflower, olive, soybean, and sunflower oils. Avocado. Seasoning and other foods Herbs. Spices. Seasoning mixes without salt. Unsalted popcorn and pretzels. Fat-free sweets. What foods are not recommended? The items listed may not be a complete list. Talk with your dietitian about what dietary choices are best for you. Grains Baked goods made with fat, such as croissants, muffins, or some breads. Dry pasta or rice meal packs. Vegetables Creamed or fried vegetables. Vegetables in a cheese sauce. Regular canned vegetables (not low-sodium or reduced-sodium). Regular canned tomato sauce and paste (not low-sodium or reduced-sodium). Regular tomato and vegetable juice (not low-sodium or reduced-sodium). Angie Fava. Olives. Fruits Canned fruit in a light or heavy syrup. Fried fruit. Fruit in cream or butter sauce. Meat and other protein foods Fatty cuts of meat. Ribs. Fried meat. Berniece Salines. Sausage. Bologna and other processed lunch meats. Salami. Fatback. Hotdogs. Bratwurst. Salted nuts and seeds. Canned beans with added salt. Canned or smoked fish. Whole eggs or egg yolks. Chicken or [place] with skin. Dairy Whole or 2% milk,  cream, and half-and-half. Whole or full-fat cream cheese. Whole-fat or sweetened yogurt. Full-fat cheese. Nondairy creamers. Whipped toppings. Processed cheese and cheese spreads. Fats and oils Butter. Stick margarine. Lard. Shortening. Ghee. Bacon fat. Tropical oils, such as coconut, palm kernel, or palm oil. Seasoning and other foods Salted popcorn and pretzels. Onion salt, garlic salt, seasoned salt, table salt, and sea salt. Worcestershire sauce. Tartar sauce. Barbecue sauce. Teriyaki sauce. Soy sauce, including reduced-sodium. Steak sauce. Canned and packaged gravies. Fish sauce. Oyster sauce. Cocktail sauce. Horseradish that you find on the shelf. Ketchup. Mustard. Meat flavorings and tenderizers. Bouillon cubes. Hot sauce and Tabasco sauce. Premade or packaged marinades. Premade or packaged taco seasonings. Relishes. Regular salad dressings. Where to find more information:  National Heart, Lung, and [place]: https://wilson-eaton.com/  American Heart Association: www.heart.org Summary  The DASH eating plan is a healthy eating plan that has been shown to reduce high blood pressure (hypertension). It may also reduce your risk for type 2 diabetes, heart disease, and stroke.  With the DASH eating plan, you should limit salt (sodium) intake to 2,300 mg a day. If you have hypertension, you may need to reduce your sodium intake to 1,500 mg a day.  When on the DASH eating plan, aim to eat more fresh fruits and vegetables, whole grains, lean proteins, low-fat dairy,  and heart-healthy fats.  Work with your health care provider or diet and nutrition specialist (dietitian) to adjust your eating plan to your individual calorie needs. This information is not intended to replace advice given to you by your health care provider. Make sure you discuss any questions you have with your health care provider. Document Released: 09/21/2011 Document Revised: 09/25/2016 Document Reviewed: 09/25/2016 Elsevier  Interactive Patient Education  2017 Elsevier Inc.  Hypertension Hypertension is another name for high blood pressure. High blood pressure forces your heart to work harder to pump blood. This can cause problems over time. There are two numbers in a blood pressure reading. There is a top number (systolic) over a bottom number (diastolic). It is best to have a blood pressure below 120/80. Healthy choices can help lower your blood pressure. You may need medicine to help lower your blood pressure if:  Your blood pressure cannot be lowered with healthy choices.  Your blood pressure is higher than 130/80.  Follow these instructions at home: Eating and drinking  If directed, follow the DASH eating plan. This diet includes: ? Filling half of your plate at each meal with fruits and vegetables. ? Filling one quarter of your plate at each meal with whole grains. Whole grains include whole wheat pasta, brown rice, and whole grain bread. ? Eating or drinking low-fat dairy products, such as skim milk or low-fat yogurt. ? Filling one quarter of your plate at each meal with low-fat (lean) proteins. Low-fat proteins include fish, skinless chicken, eggs, beans, and tofu. ? Avoiding fatty meat, cured and processed meat, or chicken with skin. ? Avoiding premade or processed food.  Eat less than 1,500 mg of salt (sodium) a day.  Limit alcohol use to no more than 1 drink a day for nonpregnant women and 2 drinks a day for men. One drink equals 12 oz of beer, 5 oz of wine, or 1 oz of hard liquor. Lifestyle  Work with your doctor to stay at a healthy weight or to lose weight. Ask your doctor what the best weight is for you.  Get at least 30 minutes of exercise that causes your heart to beat faster (aerobic exercise) most days of the week. This may include walking, swimming, or biking.  Get at least 30 minutes of exercise that strengthens your muscles (resistance exercise) at least 3 days a week. This may  include lifting weights or pilates.  Do not use any products that contain nicotine or tobacco. This includes cigarettes and e-cigarettes. If you need help quitting, ask your doctor.  Check your blood pressure at home as told by your doctor.  Keep all follow-up visits as told by your doctor. This is important. Medicines  Take over-the-counter and prescription medicines only as told by your doctor. Follow directions carefully.  Do not skip doses of blood pressure medicine. The medicine does not work as well if you skip doses. Skipping doses also puts you at risk for problems.  Ask your doctor about side effects or reactions to medicines that you should watch for. Contact a doctor if:  You think you are having a reaction to the medicine you are taking.  You have headaches that keep coming back (recurring).  You feel dizzy.  You have swelling in your ankles.  You have trouble with your vision. Get help right away if:  You get a very bad headache.  You start to feel confused.  You feel weak or numb.  You feel faint.  You  get very bad pain in your: ? Chest. ? Belly (abdomen).  You throw up (vomit) more than once.  You have trouble breathing. Summary  Hypertension is another name for high blood pressure.  Making healthy choices can help lower blood pressure. If your blood pressure cannot be controlled with healthy choices, you may need to take medicine. This information is not intended to replace advice given to you by your health care provider. Make sure you discuss any questions you have with your health care provider. Document Released: 03/20/2008 Document Revised: 08/30/2016 Document Reviewed: 08/30/2016 Elsevier Interactive Patient Education  [employer].

## 2017-09-03 NOTE — Progress Notes (Signed)
Subjective:  Jill Shaw, a 41 year old female with a history of uncontrolled hypertension and type 2 diabetes mellitus presents for a follow-up.  Patient states that blood pressures have been elevated over the past several weeks.  She was scheduled with gynecology to have a procedure, however; blood pressure was markedly high.  She says it is recommended by gynecology that she follow-up in primary care for further blood pressure control.  Blood pressure is mildly elevated on today.  Patient states that she has been taking all medications consistently.  Patient has a history of morbid obesity and is now following an exercise routine or eating a low-fat, low carbohydrate diet.  She also does not check blood pressures at home.  Cardiac symptoms include trace swelling to bilateral lower extremities.  Patient denies chest pains, palpitations, syncope, fatigue, dizziness or shortness of breath.  Cardiac risk factors include, morbid obesity, chronic tobacco use.   Past Medical History:  Diagnosis Date  . Arthritis   . Diabetes mellitus without complication (HCC)   . Hypertension   . Sickle cell trait (HCC)    Social History   Socioeconomic History  . Marital status: Single    Spouse name: Not on file  . Number of children: Not on file  . Years of education: Not on file  . Highest education level: Not on file  Social Needs  . Financial resource strain: Not on file  . Food insecurity - worry: Not on file  . Food insecurity - inability: Not on file  . Transportation needs - medical: Not on file  . Transportation needs - non-medical: Not on file  Occupational History  . Not on file  Tobacco Use  . Smoking status: Current Every Day Smoker    Packs/day: 0.25    Types: Cigarettes  . Smokeless tobacco: Never Used  Substance and Sexual Activity  . Alcohol use: No  . Drug use: No  . Sexual activity: Not on file  Other Topics Concern  . Not on file  Social History Narrative  . Not on file    Immunization History  Administered Date(s) Administered  . Pneumococcal Polysaccharide-23 03/28/2015  . Tdap 03/27/2016   Allergies  Allergen Reactions  . Aspirin Nausea Only  . Cortizone-10 [Hydrocortisone] Nausea Only  . Gabapentin Nausea And Vomiting    upset stomach  . Ibuprofen Nausea Only and Nausea And Vomiting  . Ketoprofen Nausea And Vomiting  . Liraglutide Nausea And Vomiting  . Naproxen Nausea And Vomiting  . Omeprazole-Sodium Bicarbonate Nausea And Vomiting  . Sulfa Antibiotics Nausea And Vomiting  . Tramadol Nausea And Vomiting    stomach upset  .Review of Systems  Constitutional: Negative.  Negative for malaise/fatigue.  HENT: Negative.   Eyes: Negative.   Respiratory: Negative.   Cardiovascular: Positive for leg swelling. Negative for chest pain and palpitations.  Gastrointestinal: Negative.   Genitourinary: Negative.   Musculoskeletal: Negative.   Skin: Negative.   Neurological: Negative.   Endo/Heme/Allergies: Negative.     Objective:  Physical Exam  Constitutional: She is oriented to person, place, and time and well-developed, well-nourished, and in no distress.  HENT:  Head: Normocephalic and atraumatic.  Right Ear: External ear normal.  Left Ear: External ear normal.  Nose: Nose normal.  Mouth/Throat: Oropharynx is clear and moist.  Eyes: Pupils are equal, round, and reactive to light.  Neck: Normal range of motion. Neck supple.  Cardiovascular: Normal rate, regular rhythm, normal heart sounds and intact distal pulses.  Bilateral lower extremity edema (  trace)  Pulmonary/Chest: Effort normal and breath sounds normal.  Abdominal: Soft.  Musculoskeletal: Normal range of motion.  Neurological: She is alert and oriented to person, place, and time. She has normal reflexes. Gait normal. GCS score is 15.  Skin: Skin is warm and dry.  Psychiatric: Mood, memory, affect and judgment normal.   Assessment:   BP 140/64 Comment: manually  Pulse (!) 106    Temp 98.7 F (37.1 C) (Oral)   Resp 16   Ht 5\' 4"  (1.626 m)   Wt 270 lb (122.5 kg)   LMP 08/11/2017   SpO2 100%   BMI 46.35 kg/m  Plan:  1. Essential hypertension Blood pressure mildly elevated on current regimen.  Will increase hydrochlorothiazide to 25 mg daily.  Will continue losartan at 50 mg daily.  Check blood pressure daily.  Continue DASH diet as discussed. - hydrochlorothiazide (HYDRODIURIL) 25 MG tablet; TAKE 1 TABLETS(25 MG) BY MOUTH DAILY  Dispense: 90 tablet; Refill: 1 - BASIC METABOLIC PANEL WITH GFR Dietary sodium restriction. Regular aerobic exercise. 2.  Bilateral lower extremity edema Will increase hydrochlorothiazide to 25 mg daily.  Recommend that patient elevate lower extremities to heart level while at rest.  Discussed the importance of taking antihypertensive medications consistently.  2. Abnormal urinalysis Urine leukocytes present, will add a urine culture - Urine Culture     RTC: Follow-up in 1 week for blood pressure check.  Follow-up in 1 month for hypertension    Nolon NationsLaChina Moore Hollis  MSN, FNP-C Patient Care [facility] [address]  , [place] [id number] [phone]

## 2017-09-07 ENCOUNTER — Other Ambulatory Visit: Payer: Self-pay | Admitting: Family Medicine

## 2017-09-07 DIAGNOSIS — Z794 Long term (current) use of insulin: Principal | ICD-10-CM

## 2017-09-07 DIAGNOSIS — E119 Type 2 diabetes mellitus without complications: Secondary | ICD-10-CM

## 2017-09-10 ENCOUNTER — Telehealth: Payer: Self-pay

## 2017-09-10 NOTE — Telephone Encounter (Signed)
Called, no answer. Left a message. Thanks!  

## 2017-09-10 NOTE — Telephone Encounter (Signed)
I called patient back and left a message I am returning her call. Per chart review was seen in office 08/29/17.

## 2017-09-12 ENCOUNTER — Encounter (HOSPITAL_COMMUNITY)
Admission: RE | Admit: 2017-09-12 | Discharge: 2017-09-12 | Disposition: A | Discharge status: Home / Self Care | Payer: Medicaid Other | Source: Ambulatory Visit | Attending: Obstetrics & Gynecology | Admitting: Obstetrics & Gynecology

## 2017-09-12 ENCOUNTER — Encounter (HOSPITAL_COMMUNITY): Payer: Self-pay

## 2017-09-12 ENCOUNTER — Other Ambulatory Visit: Payer: Self-pay

## 2017-09-12 ENCOUNTER — Encounter: Payer: Self-pay | Admitting: Family Medicine

## 2017-09-12 DIAGNOSIS — R569 Unspecified convulsions: Secondary | ICD-10-CM

## 2017-09-12 DIAGNOSIS — Z01818 Encounter for other preprocedural examination: Secondary | ICD-10-CM | POA: Diagnosis present

## 2017-09-12 HISTORY — DX: Unspecified convulsions: R56.9

## 2017-09-12 HISTORY — DX: Unspecified asthma, uncomplicated: J45.909

## 2017-09-12 HISTORY — DX: Gastro-esophageal reflux disease without esophagitis: K21.9

## 2017-09-12 HISTORY — DX: Chronic obstructive pulmonary disease, unspecified: J44.9

## 2017-09-12 HISTORY — DX: Myoneural disorder, unspecified: G70.9

## 2017-09-12 HISTORY — DX: Nicotine dependence, unspecified, uncomplicated: F17.200

## 2017-09-12 HISTORY — DX: Anemia, unspecified: D64.9

## 2017-09-12 LAB — BASIC METABOLIC PANEL WITH GFR
Anion gap: 14 (ref 5–15)
BUN: 10 mg/dL (ref 6–20)
CO2: 24 mmol/L (ref 22–32)
Calcium: 9 mg/dL (ref 8.9–10.3)
Chloride: 97 mmol/L — ABNORMAL LOW (ref 101–111)
Creatinine, Ser: 1.11 mg/dL — ABNORMAL HIGH (ref 0.44–1.00)
GFR calc Af Amer: >60 mL/min (ref >60)
GFR calc non Af Amer: >60 mL/min (ref >60)
Glucose, Bld: 314 mg/dL — ABNORMAL HIGH (ref 65–99)
Potassium: 3.5 mmol/L (ref 3.5–5.1)
Sodium: 135 mmol/L (ref 135–145)

## 2017-09-12 LAB — CBC
HCT: 33.2 % — ABNORMAL LOW (ref 36.0–46.0)
Hemoglobin: 10.3 g/dL — ABNORMAL LOW (ref 12.0–15.0)
MCH: 23.1 pg — ABNORMAL LOW (ref 26.0–34.0)
MCHC: 31 g/dL (ref 30.0–36.0)
MCV: 74.4 fL — ABNORMAL LOW (ref 78.0–100.0)
Platelets: 315 K/uL (ref 150–400)
RBC: 4.46 MIL/uL (ref 3.87–5.11)
RDW: 21.3 % — ABNORMAL HIGH (ref 11.5–15.5)
WBC: 10.3 K/uL (ref 4.0–10.5)

## 2017-09-12 NOTE — Patient Instructions (Addendum)
Your procedure is scheduled on:  Tuesday, Dec 4  [facility] at:  10 am  Pick up the phone at the desk and dial [phone].  Call this number if you have problems the morning of surgery: [phone].  Remember: Do NOT eat food or drink clear liquids (including water) after midnight Monday  Take these medicines the morning of surgery with a SIP OF WATER: prilosec and zanaflex if needed.  You may also use symbicort and flonase nasal spray if needed.  Do not take any diabetes medication on the day of surgery.  We will check you blood sugar upon arrival and treat if needed.  On Monday night, half your dose of Humalog insulin (give 35 unit).  Bring albuterol inhaler with you on day of surgery.  Do Not smoke on the day of surgery.  Stop herbal medications and supplements at this time.  Do NOT wear jewelry (body piercing), metal hair clips/bobby pins, make-up, or nail polish. Do NOT wear lotions, powders, or perfumes.  You may wear deoderant. Do NOT shave for 48 hours prior to surgery. Do NOT bring valuables to the hospital. Contacts may not be worn into surgery.  Have a responsible adult drive you home and stay with you for 24 hours after your procedure.  Home with Mother Amil AmenJulia cell [phone].

## 2017-09-12 NOTE — Pre-Procedure Instructions (Signed)
Reviewed patient's medical history, medications with Dr. Chaney MallingHodierne.  Patient's blood sugar at today's PAT appointment was 314.  Patient states she has not taken her diabetes medication today.  Patient instructed to take her diabetes medication daily as prescribed until DOS.  Patient has history of seizures but no medication and has not seen a neurologist.  Dr Chaney MallingHodierne wants surgical clearance from her PCP.  [facility], Dr. Armeniahina phone [phone], fax [phone].  I have fax a request to [facility] requesting [facility] clearance.  I will make Dr. Marice Potterove aware.  Waiting for surgical clearance.

## 2017-09-13 ENCOUNTER — Encounter (HOSPITAL_COMMUNITY): Payer: Self-pay | Admitting: Anesthesiology

## 2017-09-13 NOTE — Pre-Procedure Instructions (Signed)
Medical clearance from [facility] place in patient's chart.  Discussed medical clearance note with Dr. Bradley FerrisEllender (anesthesiologist) we are cleared to proceed with procedure on 09/18/17.

## 2017-09-14 MED FILL — $HUMALOG MIX 75-25 KWIKPEN: (75-25) 100 | 30 days supply | Qty: 45 | Fill #3

## 2017-09-17 ENCOUNTER — Ambulatory Visit
Admission: RE | Admit: 2017-09-17 | Discharge: 2017-09-17 | Disposition: A | Discharge status: Home / Self Care | Payer: No Typology Code available for payment source | Source: Ambulatory Visit | Attending: Obstetrics & Gynecology | Admitting: Obstetrics & Gynecology

## 2017-09-17 DIAGNOSIS — Z1231 Encounter for screening mammogram for malignant neoplasm of breast: Secondary | ICD-10-CM

## 2017-09-18 ENCOUNTER — Other Ambulatory Visit: Payer: Medicaid Other

## 2017-09-18 ENCOUNTER — Encounter (HOSPITAL_COMMUNITY): Admission: AD | Payer: Self-pay | Source: Ambulatory Visit

## 2017-09-18 ENCOUNTER — Ambulatory Visit (HOSPITAL_COMMUNITY)
Admission: AD | Admit: 2017-09-18 | Payer: Medicaid Other | Source: Ambulatory Visit | Admitting: Obstetrics & Gynecology

## 2017-09-18 SURGERY — DILATATION & CURETTAGE/HYSTEROSCOPY WITH HYDROTHERMAL ABLATION
Anesthesia: Choice

## 2017-09-19 ENCOUNTER — Encounter (HOSPITAL_COMMUNITY): Payer: Self-pay

## 2017-09-19 ENCOUNTER — Ambulatory Visit: Payer: Self-pay | Admitting: Family Medicine

## 2017-09-25 ENCOUNTER — Telehealth: Payer: Self-pay | Admitting: *Deleted

## 2017-09-25 NOTE — Telephone Encounter (Signed)
Jill FanningJulie called 09/21/17 2:52  And left a voice message she wants results of her breast exam and to reschedule surgery since she was sick on day she was supposed to have surgery.

## 2017-09-26 NOTE — Telephone Encounter (Addendum)
I called her back and informed her that her surgery was already rescheduled and gave her the date.  I also reviewed her mammogram results with her ( normal) .

## 2017-09-29 ENCOUNTER — Other Ambulatory Visit: Payer: Self-pay | Admitting: Family Medicine

## 2017-10-01 ENCOUNTER — Other Ambulatory Visit: Payer: Self-pay | Admitting: Family Medicine

## 2017-10-11 ENCOUNTER — Ambulatory Visit: Payer: No Typology Code available for payment source | Admitting: Family Medicine

## 2017-10-11 MED FILL — $HUMALOG MIX 75-25 KWIKPEN: (75-25) 100 | 32 days supply | Qty: 45 | Fill #4

## 2017-10-17 ENCOUNTER — Telehealth: Payer: Self-pay | Admitting: Podiatry

## 2017-10-17 MED ORDER — HYDROCODONE-ACETAMINOPHEN 7.5-325 MG PO TABS: 1.0000 | ORAL_TABLET | Freq: Four times a day (QID) | ORAL | 0 refills | Status: DC | PRN

## 2017-10-17 NOTE — Addendum Note (Signed)
Addended by: Alphia Kava'CONNELL, Alaycia Eardley D on: 10/17/2017 04:11 PM   Modules accepted: Orders

## 2017-10-17 NOTE — Telephone Encounter (Signed)
Pt returned my call about her appt for orthotics today and has lost her mcd and is looking onto another insurance and will let me know.  She also asked if she could get a rx for pain medication her foot is really hurting. She almost fell over the stove yesterday from the pain.She was also asking about getting disability that Dr Logan BoresEvans had mentioned it before and she needs it in writing.

## 2017-10-17 NOTE — Telephone Encounter (Signed)
Pt states Dr. Logan BoresEvans said she might be able get temporary disability, and she asked if Dr. Logan BoresEvans could write a note to her lawyer for her to apply for temporary disability. Pt states she is in between her medicaid and getting another insurance, so it is difficult for her to afford to come in. I told pt I would speak with our office manager concerning her getting in to be seen. Dr. Logan BoresEvans ordered Lortab 7.5/325mg  #15 one every 6 hours.

## 2017-10-18 ENCOUNTER — Other Ambulatory Visit: Payer: Self-pay

## 2017-10-18 MED ORDER — PREGABALIN 75 MG PO CAPS: 75.0000 mg | ORAL_CAPSULE | Freq: Two times a day (BID) | ORAL | 1 refills | Status: DC

## 2017-10-23 NOTE — Telephone Encounter (Signed)
Left message informing pt I had spoken with my office manager, and that until she had received her new medicaid or new insurance we would be able to see her as a cash pay pt and if she would call with I would give her the fee schedule for office visit and x-ray cost.

## 2017-10-23 NOTE — Telephone Encounter (Signed)
Pt states she had received my message stating that she could be seen as a self pay prior to getting insurance.

## 2017-10-23 NOTE — Telephone Encounter (Signed)
Left message for pt to call for an appt.

## 2017-10-29 ENCOUNTER — Telehealth: Payer: Self-pay

## 2017-10-30 MED ORDER — INSULIN LISPRO PROT & LISPRO (75-25 MIX) 100 UNIT/ML KWIKPEN: 70.0000 [IU] | PEN_INJECTOR | Freq: Two times a day (BID) | SUBCUTANEOUS | 11 refills | Status: DC

## 2017-10-30 NOTE — Telephone Encounter (Signed)
Patient returned call and states she needs a [facility] because she is almost out and she uses the pens, not the vial. I have corrected this and sent in the rx for her. Thanks!

## 2017-10-30 NOTE — Telephone Encounter (Signed)
Left message informing pt that I was leaving the fee schedule for established cash pay-pts so that she could be treated prior to her insurance being instated and that if x-rays there would be an additional cost.

## 2017-10-30 NOTE — Telephone Encounter (Signed)
Called, no answer. Left message for patient to call back. Thanks!  

## 2017-10-31 ENCOUNTER — Ambulatory Visit: Payer: No Typology Code available for payment source | Admitting: Family Medicine

## 2017-11-01 ENCOUNTER — Ambulatory Visit (INDEPENDENT_AMBULATORY_CARE_PROVIDER_SITE_OTHER): Payer: Self-pay | Admitting: Family Medicine

## 2017-11-01 ENCOUNTER — Ambulatory Visit (HOSPITAL_COMMUNITY)
Admission: RE | Admit: 2017-11-01 | Discharge: 2017-11-01 | Disposition: A | Discharge status: Home / Self Care | Payer: No Typology Code available for payment source | Source: Ambulatory Visit | Attending: Family Medicine | Admitting: Family Medicine

## 2017-11-01 ENCOUNTER — Encounter: Payer: Self-pay | Admitting: Family Medicine

## 2017-11-01 VITALS — BP 152/70 | HR 104 | Temp 98.4°F | Resp 18 | Ht 64.0 in | Wt 274.0 lb

## 2017-11-01 DIAGNOSIS — R739 Hyperglycemia, unspecified: Secondary | ICD-10-CM

## 2017-11-01 DIAGNOSIS — I10 Essential (primary) hypertension: Secondary | ICD-10-CM

## 2017-11-01 DIAGNOSIS — E119 Type 2 diabetes mellitus without complications: Secondary | ICD-10-CM

## 2017-11-01 DIAGNOSIS — Z794 Long term (current) use of insulin: Secondary | ICD-10-CM

## 2017-11-01 DIAGNOSIS — R000 Tachycardia, unspecified: Secondary | ICD-10-CM

## 2017-11-01 LAB — POCT URINALYSIS DIP (DEVICE)
Bilirubin Urine: NEGATIVE
GLUCOSE, UA: 500 mg/dL — AB
Hgb urine dipstick: NEGATIVE
KETONES UR: NEGATIVE mg/dL
LEUKOCYTES UA: NEGATIVE
Nitrite: NEGATIVE
PROTEIN: NEGATIVE mg/dL
SPECIFIC GRAVITY, URINE: 1.01 (ref 1.005–1.030)
Urobilinogen, UA: 0.2 mg/dL (ref 0.0–1.0)
pH: 5.5 (ref 5.0–8.0)

## 2017-11-01 LAB — BASIC METABOLIC PANEL
Anion gap: 7 (ref 5–15)
BUN: 11 mg/dL (ref 6–20)
CALCIUM: 9 mg/dL (ref 8.9–10.3)
CO2: 25 mmol/L (ref 22–32)
CREATININE: 1.05 mg/dL — AB (ref 0.44–1.00)
Chloride: 103 mmol/L (ref 101–111)
GFR calc Af Amer: >60 mL/min (ref >60)
GFR calc non Af Amer: >60 mL/min (ref >60)
GLUCOSE: 394 mg/dL — AB (ref 65–99)
Potassium: 4.4 mmol/L (ref 3.5–5.1)
Sodium: 135 mmol/L (ref 135–145)

## 2017-11-01 LAB — GLUCOSE, CAPILLARY: Glucose-Capillary: 428 mg/dL — ABNORMAL HIGH (ref 65–99)

## 2017-11-01 LAB — POCT GLYCOSYLATED HEMOGLOBIN (HGB A1C): Hemoglobin A1C: 7.6

## 2017-11-01 MED ORDER — METOPROLOL SUCCINATE ER 25 MG PO TB24: 12.5000 mg | ORAL_TABLET | Freq: Every day | ORAL | 5 refills | Status: DC

## 2017-11-01 MED ORDER — NON FORMULARY
15.0000 [IU] | Freq: Once | Status: AC
  Administered 2017-11-01: 15 [IU] via SUBCUTANEOUS

## 2017-11-01 MED ORDER — NON FORMULARY
5.0000 [IU] | Freq: Once | Status: AC
  Administered 2017-11-01: 5 [IU] via INTRAMUSCULAR

## 2017-11-01 MED ORDER — SODIUM CHLORIDE 0.9 % IV SOLN
INTRAVENOUS | Status: DC
  Administered 2017-11-01: 16:00:00 via INTRAVENOUS

## 2017-11-01 MED ORDER — INSULIN LISPRO PROT & LISPRO (75-25 MIX) 100 UNIT/ML KWIKPEN: 75.0000 [IU] | PEN_INJECTOR | Freq: Two times a day (BID) | SUBCUTANEOUS | 11 refills | Status: DC

## 2017-11-01 MED FILL — METOPROLOL SUCC ER 25 MG TA: 25 | 30 days supply | Qty: 15 | Fill #0

## 2017-11-01 MED FILL — $HUMALOG MIX 75-25 KWIKPEN: (75-25) 100 | 28 days supply | Qty: 42 | Fill #0

## 2017-11-01 NOTE — Patient Instructions (Addendum)
Heart rate consistently elevated, will add Metoprolol 12.5 mg daily. Return in 1 week for blood pressure check.  For uncontrolled DMII with increase Humalog Mix to 75 units BID. Will follow up in 1 month for DMII.  Hemoglobin a1C is 7.6, which is above goal. Your A1C goal is less than 7. Your fasting blood sugar  Upon awakening goal is between 110-140.  Blood pressure goal is <140/90.  Recommend a lowfat, low carbohydrate diet divided over 5-6 small meals, increase water intake to 6-8 glasses, and 150 minutes per week of cardiovascular exercise.   Take your medications as prescribed Make sure that you are familiar with each one of your medications and what they are used to treat.  If you are unsure of medications, please bring to follow up Will send referral for eye exam  Please keep your scheduled follow up appointment.    Meds ordered this encounter  Medications  . NON FORMULARY 15 Units    Order Specific Question:   Brand Name:    Answer:   humalog    Order Specific Question:   Generic name:    Answer:   insulin lispro injection    Order Specific Question:   Form:    Answer:   Injection [30]    Order Specific Question:   Length of Therapy:    Answer:   1 day    Order Specific Question:   Reason for Non-Formulary    Answer:   not in epic    Order Specific Question:   How soon needed? (normally 72 hrs needed to procure):    Answer:   0-24 hrs  . NON FORMULARY 5 Units    Order Specific Question:   Brand Name:    Answer:   Humalog    Order Specific Question:   Generic name:    Answer:   Insulin lispro injection    Order Specific Question:   Form:    Answer:   Injection [30]    Order Specific Question:   Length of Therapy:    Answer:   1 day    Order Specific Question:   Reason for Non-Formulary    Answer:   not in epic system    Order Specific Question:   How soon needed? (normally 72 hrs needed to procure):    Answer:   0-24 hrs  . Insulin Lispro Prot & Lispro (HUMALOG MIX  75/25 KWIKPEN) (75-25) 100 UNIT/ML Kwikpen    Sig: Inject 75 Units into the skin 2 (two) times daily with a meal.    Dispense:  42 mL    Refill:  11  . metoprolol succinate (TOPROL-XL) 25 MG 24 hr tablet    Sig: Take 0.5 tablets (12.5 mg total) by mouth daily.    Dispense:  30 tablet    Refill:  5   Diabetes Mellitus and Nutrition When you have diabetes (diabetes mellitus), it is very important to have healthy eating habits because your blood sugar (glucose) levels are greatly affected by what you eat and drink. Eating healthy foods in the appropriate amounts, at about the same times every day, can help you:  Control your blood glucose.  Lower your risk of heart disease.  Improve your blood pressure.  Reach or maintain a healthy weight.  Every person with diabetes is different, and each person has different needs for a meal plan. Your health care provider may recommend that you work with a diet and nutrition specialist (dietitian) to make a  meal plan that is best for you. Your meal plan may vary depending on factors such as:  The calories you need.  The medicines you take.  Your weight.  Your blood glucose, blood pressure, and cholesterol levels.  Your activity level.  Other health conditions you have, such as heart or kidney disease.  How do carbohydrates affect me? Carbohydrates affect your blood glucose level more than any other type of food. Eating carbohydrates naturally increases the amount of glucose in your blood. Carbohydrate counting is a method for keeping track of how many carbohydrates you eat. Counting carbohydrates is important to keep your blood glucose at a healthy level, especially if you use insulin or take certain oral diabetes medicines. It is important to know how many carbohydrates you can safely have in each meal. This is different for every person. Your dietitian can help you calculate how many carbohydrates you should have at each meal and for  snack. Foods that contain carbohydrates include:  Bread, cereal, rice, pasta, and crackers.  Potatoes and corn.  Peas, beans, and lentils.  Milk and yogurt.  Fruit and juice.  Desserts, such as cakes, cookies, ice cream, and candy.  How does alcohol affect me? Alcohol can cause a sudden decrease in blood glucose (hypoglycemia), especially if you use insulin or take certain oral diabetes medicines. Hypoglycemia can be a life-threatening condition. Symptoms of hypoglycemia (sleepiness, dizziness, and confusion) are similar to symptoms of having too much alcohol. If your health care provider says that alcohol is safe for you, follow these guidelines:  Limit alcohol intake to no more than 1 drink per day for nonpregnant women and 2 drinks per day for men. One drink equals 12 oz of beer, 5 oz of wine, or 1 oz of hard liquor.  Do not drink on an empty stomach.  Keep yourself hydrated with water, diet soda, or unsweetened iced tea.  Keep in mind that regular soda, juice, and other mixers may contain a lot of sugar and must be counted as carbohydrates.  What are tips for following this plan? Reading food labels  Start by checking the serving size on the label. The amount of calories, carbohydrates, fats, and other nutrients listed on the label are based on one serving of the food. Many foods contain more than one serving per package.  Check the total grams (g) of carbohydrates in one serving. You can calculate the number of servings of carbohydrates in one serving by dividing the total carbohydrates by 15. For example, if a food has 30 g of total carbohydrates, it would be equal to 2 servings of carbohydrates.  Check the number of grams (g) of saturated and trans fats in one serving. Choose foods that have low or no amount of these fats.  Check the number of milligrams (mg) of sodium in one serving. Most people should limit total sodium intake to less than 2,300 mg per day.  Always  check the nutrition information of foods labeled as "low-fat" or "nonfat". These foods may be higher in added sugar or refined carbohydrates and should be avoided.  Talk to your dietitian to identify your daily goals for nutrients listed on the label. Shopping  Avoid buying canned, premade, or processed foods. These foods tend to be high in fat, sodium, and added sugar.  Shop around the outside edge of the grocery store. This includes fresh fruits and vegetables, bulk grains, fresh meats, and fresh dairy. Cooking  Use low-heat cooking methods, such as baking, instead  of high-heat cooking methods like deep frying.  Cook using healthy oils, such as olive, canola, or sunflower oil.  Avoid cooking with butter, cream, or high-fat meats. Meal planning  Eat meals and snacks regularly, preferably at the same times every day. Avoid going long periods of time without eating.  Eat foods high in fiber, such as fresh fruits, vegetables, beans, and whole grains. Talk to your dietitian about how many servings of carbohydrates you can eat at each meal.  Eat 4-6 ounces of lean protein each day, such as lean meat, chicken, fish, eggs, or tofu. 1 ounce is equal to 1 ounce of meat, chicken, or fish, 1 egg, or 1/4 cup of tofu.  Eat some foods each day that contain healthy fats, such as avocado, nuts, seeds, and fish. Lifestyle   Check your blood glucose regularly.  Exercise at least 30 minutes 5 or more days each week, or as told by your health care provider.  Take medicines as told by your health care provider.  Do not use any products that contain nicotine or tobacco, such as cigarettes and e-cigarettes. If you need help quitting, ask your health care provider.  Work with a Veterinary surgeon or diabetes educator to identify strategies to manage stress and any emotional and social challenges. What are some questions to ask my health care provider?  Do I need to meet with a diabetes educator?  Do I need  to meet with a dietitian?  What number can I call if I have questions?  When are the best times to check my blood glucose? Where to find more information:  American Diabetes Association: diabetes.org/food-and-fitness/food  Academy of Nutrition and Dietetics: https://www.vargas.com/  [employer] of Diabetes and Digestive and Kidney Diseases (NIH): FindJewelers.cz Summary  A healthy meal plan will help you control your blood glucose and maintain a healthy lifestyle.  Working with a diet and nutrition specialist (dietitian) can help you make a meal plan that is best for you.  Keep in mind that carbohydrates and alcohol have immediate effects on your blood glucose levels. It is important to count carbohydrates and to use alcohol carefully. This information is not intended to replace advice given to you by your health care provider. Make sure you discuss any questions you have with your health care provider. Document Released: 06/29/2005 Document Revised: 11/06/2016 Document Reviewed: 11/06/2016 Elsevier Interactive Patient Education  [employer].

## 2017-11-01 NOTE — Discharge Instructions (Signed)
Patient received 500 mL bolus of 0.9% Normal Saline and labs drawn.

## 2017-11-01 NOTE — Progress Notes (Signed)
Subjective:  Jill Shaw, a 42 year old female with a history of uncontrolled hypertension and type 2 diabetes mellitus presents for a follow-up.    Jill Shaw has a history of type 2 diabetes mellitus. Blood sugar 434 on arrival. Patient says that she did not take medication this am. She says that she ate a foot long tuna sub on white bread. Patient denies blurred vision, dizziness, polyuria, polydipsia, or polyphagia.   Patient states that blood pressures have been elevated over the past several weeks.  Patient states that she has been taking all medications consistently.  Patient has a history of morbid obesity and is now following an exercise routine or eating a low-fat, low carbohydrate diet.  She also does not check blood pressures at home.  Cardiac symptoms include trace swelling to bilateral lower extremities.  Patient denies chest pains, palpitations, syncope, fatigue, dizziness or shortness of breath.  Cardiac risk factors include, morbid obesity, chronic tobacco use.   Past Medical History:  Diagnosis Date  . Anemia   . Arthritis    knees, hands  . Asthma   . COPD (chronic obstructive pulmonary disease) (HCC)   . Diabetes mellitus without complication (HCC)    type 2  . GERD (gastroesophageal reflux disease)   . Hypertension   . Neuromuscular disorder (HCC)    neuropathy feet  . Seizures (HCC)    pt states r/t stress and blood sugar - no meds last one 4 months ago, not seen neurologist  . Sickle cell trait (HCC)   . Smoker    Social History   Socioeconomic History  . Marital status: Single    Spouse name: Not on file  . Number of children: Not on file  . Years of education: Not on file  . Highest education level: Not on file  Social Needs  . Financial resource strain: Not on file  . Food insecurity - worry: Not on file  . Food insecurity - inability: Not on file  . Transportation needs - medical: Not on file  . Transportation needs - non-medical: Not on file   Occupational History  . Not on file  Tobacco Use  . Smoking status: Current Every Day Smoker    Packs/day: 0.25    Years: 15.00    Pack years: 3.75    Types: Cigarettes  . Smokeless tobacco: Never Used  Substance and Sexual Activity  . Alcohol use: No  . Drug use: No  . Sexual activity: Not Currently    Birth control/protection: None  Other Topics Concern  . Not on file  Social History Narrative  . Not on file   Immunization History  Administered Date(s) Administered  . Pneumococcal Polysaccharide-23 03/28/2015  . Tdap 03/27/2016   Allergies  Allergen Reactions  . Aspirin Nausea Only  . Cortizone-10 [Hydrocortisone] Nausea Only  . Gabapentin Nausea And Vomiting    upset stomach  . Ibuprofen Nausea Only and Nausea And Vomiting  . Ketoprofen Nausea And Vomiting  . Liraglutide Nausea And Vomiting  . Naproxen Nausea And Vomiting  . Omeprazole-Sodium Bicarbonate Nausea And Vomiting  . Sulfa Antibiotics Nausea And Vomiting  . Tramadol Nausea And Vomiting    stomach upset  .Review of Systems  Constitutional: Negative.  Negative for malaise/fatigue.  HENT: Negative.   Eyes: Negative.   Respiratory: Negative.   Cardiovascular: Positive for leg swelling. Negative for chest pain and palpitations.  Gastrointestinal: Negative.   Genitourinary: Negative.   Musculoskeletal: Negative.   Skin: Negative.   Neurological: Negative.  Endo/Heme/Allergies: Negative.   Psychiatric/Behavioral: Negative.     Objective:  Physical Exam  Constitutional: She is oriented to person, place, and time and well-developed, well-nourished, and in no distress.  HENT:  Head: Normocephalic and atraumatic.  Right Ear: External ear normal.  Left Ear: External ear normal.  Nose: Nose normal.  Mouth/Throat: Oropharynx is clear and moist.  Eyes: Pupils are equal, round, and reactive to light.  Neck: Normal range of motion. Neck supple.  Cardiovascular: Normal rate, regular rhythm, normal heart  sounds and intact distal pulses.  Bilateral lower extremity edema (trace)  Pulmonary/Chest: Effort normal and breath sounds normal.  Abdominal: Soft.  Musculoskeletal: Normal range of motion.  Neurological: She is alert and oriented to person, place, and time. She has normal reflexes. Gait normal. GCS score is 15.  Skin: Skin is warm and dry.  Psychiatric: Mood, memory, affect and judgment normal.   Assessment:   BP (!) 152/70 (BP Location: Right Arm, Patient Position: Sitting, Cuff Size: Large) Comment: manually  Pulse (!) 104   Temp 98.4 F (36.9 C) (Oral)   Resp 18   Ht 5\' 4"  (1.626 m)   Wt 274 lb (124.3 kg)   LMP 10/09/2017   SpO2 100%   BMI 47.03 kg/m  Plan:  Type 2 diabetes mellitus without complication, with long-term current use of insulin (HCC) Blood sugar markedly elevated on arrival. Administered humalog 20 units total.  Patient also given 500 ml fluid bolus. Blood sugar decreased to 328. There have been some compliance issues here. I have discussed with her the great importance of following the treatment plan exactly as directed in order to achieve a good medical outcome. Discussed the importance of following a carbohydrate modified diet and exercising consistently.  Will also increase Humalog mix to 75 units BID.  Will follow up in 1 month for DMII - Glucose, capillary - HgB A1c - NON FORMULARY 15 Units - Insulin Lispro Prot & Lispro (HUMALOG MIX 75/25 KWIKPEN) (75-25) 100 UNIT/ML Kwikpen; Inject 75 Units into the skin 2 (two) times daily with a meal.  Dispense: 42 mL; Refill: 11 - Glucose, capillary - Glucose, capillary  Hyperglycemia Patient received a total of 20 units Humalog.  Also, received at 500 ml fluid bolus - NON FORMULARY 5 Units  Essential hypertension Blood pressure is above goal on current medication regimen. Metoprolol 12.5 mg daily. Will follow up for bp check in 1 week.  - metoprolol succinate (TOPROL-XL) 25 MG 24 hr tablet; Take 0.5 tablets  (12.5 mg total) by mouth daily.  Dispense: 30 tablet; Refill: 5  Tachycardia - metoprolol succinate (TOPROL-XL) 25 MG 24 hr tablet; Take 0.5 tablets (12.5 mg total) by mouth daily.  Dispense: 30 tablet; Refill: 5  Tobacco dependence Smoking cessation instruction/counseling given:  counseled patient on the dangers of tobacco use, advised patient to stop smoking, and reviewed strategies to maximize success   RTC: 1 week for bp check and 1 month for chronic conditions   Nolon Nations  MSN, FNP-C Patient Care [facility] [address] [place], [place] [address] [phone]

## 2017-11-01 NOTE — Progress Notes (Signed)
PATIENT CARE CENTER NOTE  Diagnosis: Hyperglycemia    Provider: Armeniahina Hollis, FNP   Procedure: 500 mL bolus of 0.9% Normal Saline   Note: Patient received 500 mL bolus of 0.9% Normal Saline and labs drawn. Patient tolerated infusion well. Discharge instructions and appointment given to patient. Patient alert, oriented and ambulatory at discharge instructions.

## 2017-11-02 LAB — GLUCOSE, CAPILLARY
Glucose-Capillary: 434 mg/dL — ABNORMAL HIGH (ref 65–99)
Glucose-Capillary: 325 mg/dL — ABNORMAL HIGH (ref 65–99)

## 2017-11-07 ENCOUNTER — Other Ambulatory Visit: Payer: Self-pay | Admitting: Family Medicine

## 2017-11-08 ENCOUNTER — Telehealth: Payer: Self-pay

## 2017-11-08 NOTE — Telephone Encounter (Signed)
Called and spoke with patient, she was asking about a cbc. I advised her that her last visit a cbc was not preformed. She is scheduling an appointment to follow up on diabetes and will ask provider about this at that time. Thanks!

## 2017-11-14 ENCOUNTER — Telehealth: Payer: Self-pay | Admitting: Podiatry

## 2017-11-14 NOTE — Telephone Encounter (Signed)
I was wondering if Dr. Logan BoresEvans nurse could call me back as soon as possible. My phone number is [phone]. Thank you and bye.

## 2017-11-14 NOTE — Telephone Encounter (Signed)
Left message informing pt if she would leave a question I often could get back with her answer.

## 2017-11-14 NOTE — Telephone Encounter (Addendum)
Pt states she is not able to come in due to her Medicaid has expired, but her disability lawyer wanted a note from Dr. Logan BoresEvans stating pt is unable to stand at work, due to pain and weakness of the right foot, and to please fax to [employer].

## 2017-11-14 NOTE — Telephone Encounter (Signed)
We cannot provide a letter stating permanent disability.  We have not exhausted all treatment modalities to try and alleviate her symptoms.  Patient may request temporary disability if she wants. Thanks, Dr. Logan BoresEvans

## 2017-11-14 NOTE — Telephone Encounter (Signed)
Pt called with [employer] fax [phone].

## 2017-11-15 ENCOUNTER — Ambulatory Visit: Payer: No Typology Code available for payment source | Admitting: Family Medicine

## 2017-11-15 NOTE — Telephone Encounter (Signed)
Left message informing pt I had given Dr. Logan BoresEvans all of the messages and he requires she make an appt prior to providing a letter, and to call [phone] to schedule and can asked to speak with office manager to discuss payment and cost of appt.

## 2017-11-15 NOTE — Telephone Encounter (Signed)
I haven't even seen this patient since 08/15/18. She doesn't even have another appt scheduled with me. She needs to come in before we provide any letter.  Dr. Logan BoresEvans

## 2017-11-16 ENCOUNTER — Telehealth: Payer: Self-pay | Admitting: *Deleted

## 2017-11-16 NOTE — Patient Instructions (Addendum)
Your procedure is scheduled on:  Tuesday, Feb 12  [facility]  10 am  Pick up the phone at the desk and dial [phone].  Call this number if you have problems the morning of surgery: [phone].  Remember: Do NOT eat or Do NOT drink clear liquids (including water) after midnight Monday  Take these medicines the morning of surgery with a SIP OF WATER:  Lyrica, metoprolol, prilosec and tizanidine.  Ok to use symbicort and flonase.  Do not take any diabetes medication on day of surgery.  We will check your blood sugar upon arrival and treat if needed.  Bring albuterol inhaler with you on day of surgery.  Do Not smoke on the day of surgery.  Stop herbal medications and supplements at this time.  Do NOT wear jewelry (body piercing), metal hair clips/bobby pins, make-up, or nail polish. Do NOT wear lotions, powders, or perfumes.  You may wear deoderant. Do NOT shave for 48 hours prior to surgery. Do NOT bring valuables to the hospital. Contacts, dentures, or bridgework may not be worn into surgery.  Leave suitcase in car.  After surgery it may be brought to your room.  For patients admitted to the hospital, checkout time is 11:00 AM the day of discharge. Have a responsible adult drive you home and stay with you for 24 hours after your procedure

## 2017-11-16 NOTE — Telephone Encounter (Signed)
Pt states the office visit will cost her over $120.00 and she will have to find another way.

## 2017-11-19 MED ORDER — LACTATED RINGERS IV SOLN: INTRAVENOUS | Status: DC

## 2017-11-19 MED ORDER — SCOPOLAMINE 1 MG/3DAYS TD PT72: 1.0000 | MEDICATED_PATCH | Freq: Once | TRANSDERMAL | Status: DC

## 2017-11-20 NOTE — Telephone Encounter (Signed)
Left message to have pt call and discuss the appt cost with office manager.

## 2017-11-20 NOTE — Telephone Encounter (Signed)
Pt states she can not come in she can't afford $120.00 appt, she'll have to find another way.

## 2017-11-21 ENCOUNTER — Encounter (HOSPITAL_COMMUNITY)
Admission: RE | Admit: 2017-11-21 | Discharge: 2017-11-21 | Disposition: A | Discharge status: Home / Self Care | Payer: No Typology Code available for payment source | Source: Ambulatory Visit | Attending: Obstetrics & Gynecology | Admitting: Obstetrics & Gynecology

## 2017-11-26 ENCOUNTER — Telehealth: Payer: Self-pay | Admitting: *Deleted

## 2017-11-26 MED FILL — $HUMALOG MIX 75-25 KWIKPEN: (75-25) 100 | 28 days supply | Qty: 42 | Fill #1

## 2017-11-26 NOTE — Telephone Encounter (Signed)
Jill FanningJulie called today and left a message she is calling about the surgery that was cancelled for tomorrow due to her insurance. Wants someone to call her and discuss.

## 2017-11-27 ENCOUNTER — Encounter (HOSPITAL_COMMUNITY): Admission: AD | Payer: Self-pay | Source: Ambulatory Visit

## 2017-11-27 ENCOUNTER — Ambulatory Visit (HOSPITAL_COMMUNITY)
Admission: AD | Admit: 2017-11-27 | Payer: No Typology Code available for payment source | Source: Ambulatory Visit | Admitting: Obstetrics & Gynecology

## 2017-11-27 SURGERY — DILATATION & CURETTAGE/HYSTEROSCOPY WITH HYDROTHERMAL ABLATION
Anesthesia: Choice

## 2017-11-28 MED FILL — ?TIZANIDINE HCL 4MG TABLETS: 4 | 30 days supply | Qty: 90 | Fill #0

## 2017-11-30 ENCOUNTER — Encounter: Payer: Self-pay | Admitting: Obstetrics & Gynecology

## 2017-11-30 NOTE — Progress Notes (Signed)
Pre-Op unable to get in touch with patient, surgery cancelled. Will need to be seen in the office if she wants to reschedule.   1st cancellation was on 09/18/17 (day of surgery) she called and said she couldn't make it.

## 2017-12-10 NOTE — Telephone Encounter (Signed)
I called Raynelle FanningJulie and was unable to leave a message- heard a message voicemail is full.

## 2017-12-12 NOTE — Telephone Encounter (Signed)
I called Jill FanningJulie back and heard a message voicemail is full. I was unable to leave a message.

## 2017-12-13 ENCOUNTER — Telehealth: Payer: Self-pay

## 2017-12-13 NOTE — Telephone Encounter (Signed)
Called and spoke with patient, she was requesting an antibiotic for eat infection to be called in. I advised that she will have to be seen for this, and I have transferred her to the front to make an appointment. Thanks!

## 2017-12-20 ENCOUNTER — Other Ambulatory Visit: Payer: Self-pay

## 2017-12-20 DIAGNOSIS — E119 Type 2 diabetes mellitus without complications: Secondary | ICD-10-CM

## 2017-12-20 DIAGNOSIS — Z794 Long term (current) use of insulin: Principal | ICD-10-CM

## 2017-12-20 MED ORDER — POTASSIUM CHLORIDE ER 10 MEQ PO TBCR: 10.0000 meq | EXTENDED_RELEASE_TABLET | Freq: Every day | ORAL | 0 refills | Status: DC

## 2017-12-20 MED ORDER — INSULIN LISPRO PROT & LISPRO (75-25 MIX) 100 UNIT/ML KWIKPEN: 75.0000 [IU] | PEN_INJECTOR | Freq: Two times a day (BID) | SUBCUTANEOUS | 11 refills | Status: DC

## 2017-12-20 MED ORDER — OMEPRAZOLE 40 MG PO CPDR: 40.0000 mg | DELAYED_RELEASE_CAPSULE | Freq: Every day | ORAL | 3 refills | Status: DC

## 2017-12-20 MED FILL — POTASSIUM CL 10 MEQ TAB SA: 10 | 30 days supply | Qty: 30 | Fill #0

## 2017-12-20 MED FILL — OMEPRAZOLE DR 40 MG CAPSULE: 40 | 30 days supply | Qty: 30 | Fill #0

## 2017-12-20 NOTE — Telephone Encounter (Signed)
Refills sent in for patient, per her request. Thanks!

## 2017-12-21 MED FILL — $HUMALOG MIX 75-25 KWIKPEN: (75-25) 100 | 28 days supply | Qty: 42 | Fill #2

## 2018-01-02 ENCOUNTER — Ambulatory Visit: Payer: No Typology Code available for payment source

## 2018-01-07 ENCOUNTER — Ambulatory Visit (INDEPENDENT_AMBULATORY_CARE_PROVIDER_SITE_OTHER): Payer: Self-pay | Admitting: Family Medicine

## 2018-01-07 ENCOUNTER — Encounter: Payer: Self-pay | Admitting: Family Medicine

## 2018-01-07 VITALS — BP 140/58 | HR 85 | Temp 98.3°F | Resp 18 | Ht 64.0 in | Wt 273.0 lb

## 2018-01-07 DIAGNOSIS — E119 Type 2 diabetes mellitus without complications: Secondary | ICD-10-CM

## 2018-01-07 DIAGNOSIS — N921 Excessive and frequent menstruation with irregular cycle: Secondary | ICD-10-CM

## 2018-01-07 DIAGNOSIS — J449 Chronic obstructive pulmonary disease, unspecified: Secondary | ICD-10-CM

## 2018-01-07 DIAGNOSIS — I10 Essential (primary) hypertension: Secondary | ICD-10-CM

## 2018-01-07 DIAGNOSIS — Z794 Long term (current) use of insulin: Secondary | ICD-10-CM

## 2018-01-07 DIAGNOSIS — F17200 Nicotine dependence, unspecified, uncomplicated: Secondary | ICD-10-CM

## 2018-01-07 LAB — POCT URINALYSIS DIP (DEVICE)
Bilirubin Urine: NEGATIVE
Glucose, UA: NEGATIVE mg/dL
KETONES UR: NEGATIVE mg/dL
Nitrite: NEGATIVE
PROTEIN: NEGATIVE mg/dL
SPECIFIC GRAVITY, URINE: 1.02 (ref 1.005–1.030)
UROBILINOGEN UA: 2 mg/dL — AB (ref 0.0–1.0)
pH: 5.5 (ref 5.0–8.0)

## 2018-01-07 LAB — GLUCOSE, POCT (MANUAL RESULT ENTRY)
POC Glucose: 42 mg/dl — AB (ref 70–99)
POC Glucose: 60 mg/dl — AB (ref 70–99)
POC Glucose: 54 mg/dl — AB (ref 70–99)

## 2018-01-07 LAB — POCT GLYCOSYLATED HEMOGLOBIN (HGB A1C): Hemoglobin A1C: 8.5

## 2018-01-07 MED ORDER — BUDESONIDE-FORMOTEROL FUMARATE 160-4.5 MCG/ACT IN AERO: 2.0000 | INHALATION_SPRAY | Freq: Two times a day (BID) | RESPIRATORY_TRACT | 3 refills | Status: DC

## 2018-01-07 MED ORDER — HUMALOG JUNIOR KWIKPEN 100 UNIT/ML ~~LOC~~ SOPN: 4.0000 [IU] | PEN_INJECTOR | Freq: Three times a day (TID) | SUBCUTANEOUS | 11 refills | Status: DC

## 2018-01-07 MED ORDER — INSULIN DETEMIR 100 UNIT/ML FLEXPEN: 25.0000 [IU] | Freq: Every day | SUBCUTANEOUS | 11 refills | Status: DC

## 2018-01-07 MED FILL — !LEVEMIR 100 UNITS/ML VIAL: 100/ML | 28 days supply | Qty: 10 | Fill #0

## 2018-01-07 MED FILL — !HUMALOG 100 UNITS/ML KWIKP: 100 | 25 days supply | Qty: 3 | Fill #0

## 2018-01-07 MED FILL — SYMBICORT 160-4.5 MCG INH: 160-4.5 | 30 days supply | Qty: 10 | Fill #0

## 2018-01-07 NOTE — Progress Notes (Signed)
Subjective:  Jill Shaw, a 42 year old female with a history of uncontrolled hypertension and type 2 diabetes mellitus presents for a follow-up.    Ms. Shadle has a history of type 2 diabetes mellitus. Blood sugar 42 on arrival. Patient says that she ate breakfast and injected 75 units Novolog 70/30 mix. Patient says that blood sugars have been averaging 250-300.  Patient denies blurred vision, dizziness, polyuria, polydipsia, or polyphagia.   Patient states that blood pressures have been elevated over the past several weeks.  Patient states that she has been taking all medications consistently.  Patient has a history of morbid obesity and is now following an exercise routine or eating a low-fat, low carbohydrate diet.  She also does not routinely check blood pressures at home.  Cardiac symptoms include trace swelling to bilateral lower extremities.  Patient denies chest pains, palpitations, syncope, fatigue, dizziness or shortness of breath.  Cardiac risk factors include, morbid obesity, chronic tobacco use.   Past Medical History:  Diagnosis Date  . Anemia   . Arthritis    knees, hands  . Asthma   . COPD (chronic obstructive pulmonary disease) (HCC)   . Diabetes mellitus without complication (HCC)    type 2  . GERD (gastroesophageal reflux disease)   . Hypertension   . Neuromuscular disorder (HCC)    neuropathy feet  . Seizures (HCC)    pt states r/t stress and blood sugar - no meds last one 4 months ago, not seen neurologist  . Sickle cell trait (HCC)   . Smoker    Social History   Socioeconomic History  . Marital status: Single    Spouse name: Not on file  . Number of children: Not on file  . Years of education: Not on file  . Highest education level: Not on file  Occupational History  . Not on file  Social Needs  . Financial resource strain: Not on file  . Food insecurity:    Worry: Not on file    Inability: Not on file  . Transportation needs:    Medical: Not on  file    Non-medical: Not on file  Tobacco Use  . Smoking status: Current Every Day Smoker    Packs/day: 0.25    Years: 15.00    Pack years: 3.75    Types: Cigarettes  . Smokeless tobacco: Never Used  Substance and Sexual Activity  . Alcohol use: No  . Drug use: No  . Sexual activity: Not Currently    Birth control/protection: None  Lifestyle  . Physical activity:    Days per week: Not on file    Minutes per session: Not on file  . Stress: Not on file  Relationships  . Social connections:    Talks on phone: Not on file    Gets together: Not on file    Attends religious service: Not on file    Active member of club or organization: Not on file    Attends meetings of clubs or organizations: Not on file    Relationship status: Not on file  . Intimate partner violence:    Fear of current or ex partner: Not on file    Emotionally abused: Not on file    Physically abused: Not on file    Forced sexual activity: Not on file  Other Topics Concern  . Not on file  Social History Narrative  . Not on file   Immunization History  Administered Date(s) Administered  . Pneumococcal Polysaccharide-23 03/28/2015  .  Tdap 03/27/2016   Allergies  Allergen Reactions  . Aspirin Nausea Only  . Cortizone-10 [Hydrocortisone] Nausea Only  . Gabapentin Nausea And Vomiting    upset stomach  . Ibuprofen Nausea Only and Nausea And Vomiting  . Ketoprofen Nausea And Vomiting  . Liraglutide Nausea And Vomiting  . Naproxen Nausea And Vomiting  . Omeprazole-Sodium Bicarbonate Nausea And Vomiting  . Sulfa Antibiotics Nausea And Vomiting  . Tramadol Nausea And Vomiting    stomach upset  .Review of Systems  Constitutional: Negative.  Negative for malaise/fatigue.  HENT: Negative.   Eyes: Negative.   Respiratory: Negative.   Cardiovascular: Positive for leg swelling. Negative for chest pain and palpitations.  Gastrointestinal: Negative.   Genitourinary: Negative.   Musculoskeletal: Negative.    Skin: Negative.   Neurological: Negative.   Endo/Heme/Allergies: Negative.   Psychiatric/Behavioral: Negative.     Objective:  Physical Exam  Constitutional: She is oriented to person, place, and time and well-developed, well-nourished, and in no distress.  HENT:  Head: Normocephalic and atraumatic.  Right Ear: External ear normal.  Left Ear: External ear normal.  Nose: Nose normal.  Mouth/Throat: Oropharynx is clear and moist.  Eyes: Pupils are equal, round, and reactive to light.  Neck: Normal range of motion. Neck supple.  Cardiovascular: Normal rate, regular rhythm, normal heart sounds and intact distal pulses.  Bilateral lower extremity edema (trace)  Pulmonary/Chest: Effort normal and breath sounds normal.  Abdominal: Soft.  Musculoskeletal: Normal range of motion.  Neurological: She is alert and oriented to person, place, and time. She has normal reflexes. Gait normal. GCS score is 15.  Skin: Skin is warm and dry.  Psychiatric: Mood, memory, affect and judgment normal.   Assessment:   BP (!) 140/58 (BP Location: Left Arm, Patient Position: Sitting, Cuff Size: Large) Comment: manually  Pulse 85   Temp 98.3 F (36.8 C) (Oral)   Resp 18   Ht 5\' 4"  (1.626 m)   Wt 273 lb (123.8 kg)   LMP 01/06/2018   SpO2 99%   BMI 46.86 kg/m  Plan:  1. Type 2 diabetes mellitus without complication, with long-term current use of insulin (HCC) Blood sugar 42 on arrival, given glucose tablets. Blood sugar increased. Advised to eat a carbohydrate modified lunch. Check CBGs frequently throughout the day. Advised to report to ER if hypoglycemia continues.  Will discontinue 70/30 mix due to inability to gain control. Patient's blood sugars has been fluctuating.  Will add Levemir 25 units at bedtime and humalog 4 units for meal coverage.  Advised to bring glucometer to follow up appointment for review.  - HgB A1c - Glucose (CBG) - insulin detemir (LEVEMIR) 100 unit/ml SOLN; Inject 0.25 mLs  (25 Units total) into the skin at bedtime.  Dispense: 10 mL; Refill: 11 - insulin lispro (HUMALOG) 100 UNIT/ML KwikPen Junior; Inject 0.04 mLs (4 Units total) into the skin 3 (three) times daily.  Dispense: 3 mL; Refill: 11 - Basic metabolic panel - Glucose (CBG) - Glucose (CBG)  2. Essential hypertension Blood pressure is at goal on current medication regimen. No changes warranted on today.  Continue medication, monitor blood pressure at home. Continue DASH diet. Reminded to go to the ER if any CP, SOB, nausea, dizziness, severe HA, changes vision/speech, left arm numbness and tingling and jaw pain.    - Basic metabolic panel  3. Morbid obesity (HCC) Patient continues to consume high fat/high cholesterol meals. Also, patient is unable to exercise due to chronic pain and arthritis.  Patient advised to followed a carbohydrate modified diet and was given written information.   4. Tobacco dependence Smoking cessation instruction/counseling given:  counseled patient on the dangers of tobacco use, advised patient to stop smoking, and reviewed strategies to maximize success  5. Menorrhagia with irregular cycle - CBC with Differential  6. Chronic obstructive pulmonary disease, unspecified COPD type (HCC) - budesonide-formoterol (SYMBICORT) 160-4.5 MCG/ACT inhaler; Inhale 2 puffs into the lungs 2 (two) times daily.  Dispense: 1 Inhaler; Refill: 3   RTC: 2 weeks for DMII  Nolon Nations  MSN, FNP-C Patient Care [facility] [address] [place], [place] [address] [phone]

## 2018-01-07 NOTE — Patient Instructions (Signed)
You have a history of chronic obstructive pulmonary disease, will increase Symbicort to 160-4.5 mg 2 puffs twice daily.  Continue to use albuterol every 6 hours 2 puffs for any shortness of breath, persistent coughing, and/or wheezing. I also recommend that you decrease daily smoking to assist with this problem.  Your blood pressure is at goal on current medication regimen, no changes warranted on today.- Continue medication, monitor blood pressure at home. Continue DASH diet. Reminder to go to the ER if any CP, SOB, nausea, dizziness, severe HA, changes vision/speech, left arm numbness and tingling and jaw pain.   Your hemoglobin A1c is increased to 8.5.  We will change medications to the following regimen: We will start Levemir 25 units at bedtime. We will also start Humalog 4 units prior to each meal.  Check blood sugar fasting, with meals, and at bedtime.  Bring glucometer to follow-up appointment.  Recommend that she start a carbohydrate modify diet.  Decrease your intake of juices, soda, white bread, pasta, rice, chips, grits etc. also, refrain from cookies, cakes, and pies.  Increase your water intake to 6-8 glasses/day, and increase your intake of lean meats, veggies.  Limit fruit intake to 1 serving per day

## 2018-01-07 NOTE — Progress Notes (Signed)
42

## 2018-01-08 ENCOUNTER — Telehealth: Payer: Self-pay

## 2018-01-08 LAB — CBC WITH DIFFERENTIAL/PLATELET
BASOS ABS: 0 10*3/uL (ref 0.0–0.2)
Basos: 1 %
EOS (ABSOLUTE): 0.3 10*3/uL (ref 0.0–0.4)
Eos: 4 %
Hematocrit: 31.5 % — ABNORMAL LOW (ref 34.0–46.6)
Hemoglobin: 9.1 g/dL — ABNORMAL LOW (ref 11.1–15.9)
Immature Grans (Abs): 0 10*3/uL (ref 0.0–0.1)
Immature Granulocytes: 0 %
LYMPHS ABS: 2.1 10*3/uL (ref 0.7–3.1)
Lymphs: 24 %
MCH: 21.4 pg — AB (ref 26.6–33.0)
MCHC: 28.9 g/dL — AB (ref 31.5–35.7)
MCV: 74 fL — ABNORMAL LOW (ref 79–97)
MONOCYTES: 6 %
MONOS ABS: 0.5 10*3/uL (ref 0.1–0.9)
NEUTROS ABS: 5.9 10*3/uL (ref 1.4–7.0)
Neutrophils: 65 %
Platelets: 270 10*3/uL (ref 150–379)
RBC: 4.26 x10E6/uL (ref 3.77–5.28)
RDW: 21.3 % — AB (ref 12.3–15.4)
WBC: 8.9 10*3/uL (ref 3.4–10.8)

## 2018-01-08 LAB — BASIC METABOLIC PANEL
BUN / CREAT RATIO: 9 (ref 9–23)
BUN: 9 mg/dL (ref 6–24)
CHLORIDE: 105 mmol/L (ref 96–106)
CO2: 21 mmol/L (ref 20–29)
Calcium: 9.2 mg/dL (ref 8.7–10.2)
Creatinine, Ser: 1 mg/dL (ref 0.57–1.00)
GFR calc non Af Amer: 70 mL/min/{1.73_m2} (ref >59)
GFR, EST AFRICAN AMERICAN: 81 mL/min/{1.73_m2} (ref >59)
GLUCOSE: 45 mg/dL — AB (ref 65–99)
POTASSIUM: 4.1 mmol/L (ref 3.5–5.2)
SODIUM: 141 mmol/L (ref 134–144)

## 2018-01-08 NOTE — Telephone Encounter (Signed)
Called, no answer. Left a message for patient to call back. Thanks!  

## 2018-01-08 NOTE — Telephone Encounter (Signed)
Patient returned call and I advised that labs were consistent with anemia. Advised to continue to take ferrous sulfate daily as directed. Advised to eat iron rich diet, also advised to follow up with gyn concerning heavy menstrual periods. Patient states blood sugars have been around 240 today. She was reminded that she needs to be following new directions with insulin and check blood sugars daily as directed. Patient was advised that she needs to hold insulin if under 70 and eat a 15 gram carb snack such as greek yogurt, peanut butter, or skim milk. Patient was also informed to report to clinic or ER if blood sugar was over 400. Thanks!

## 2018-01-08 NOTE — Telephone Encounter (Signed)
-----   Message from Massie MaroonLachina M Hollis, [place] sent at 01/08/2018  6:16 AM EDT ----- Regarding: lab results Please inform patient that laboratory values are consistent with anemia. Hemoglobin level is 9.1, will continue Ferrous sulfate as prescribed. Also, recommend an iron rich diet. Recommend that she schedules follow up with gynecology concerning heavy menstrual cycles.  Also, inquire about today's blood sugars and answer any questions that patient may have concerning medication changes made on yesterday. She is to take Levemir 25 units at bedtime and Novolog 4 units prior to meals. Check CBGs fasting, prior to meals, and at bedtime. Bring glucometer to follow up appointment. Remind patient if blood sugar is < 70 hold Novolog and consume a 15 gram carbohydrate snack (Greek yogurt, peanut butter, skim milk, etc).  Also, if CBG is greater than 400 report to clinic or ER.   Nolon NationsLachina Moore Hollis  MSN, FNP-C Patient Care [facility] [address]  [place], [place] [address] [phone]

## 2018-01-10 ENCOUNTER — Telehealth: Payer: Self-pay

## 2018-01-10 NOTE — Telephone Encounter (Signed)
-----   Message from Massie MaroonLachina M Hollis, [place] sent at 01/10/2018  2:11 PM EDT ----- Regarding: RE: blood sugars   Please have patient schedule an office visit for early next week. Also, report to ER for any blood sugar greater than 350.   Nolon NationsLachina Moore Hollis  MSN, FNP-C Patient Care [facility] [address]  [place], [place] [address] [phone]  ----- Message ----- From: Loney HeringBatten, Laura E, LPN Sent: 9/14/78293/28/2019   1:32 PM To: Massie MaroonLachina M Hollis, FNP Subject: blood sugars                                   Patient called to say her blood sugars are running 270's to 280's. She just wanted to inform you.

## 2018-01-10 NOTE — Telephone Encounter (Signed)
Called and spoke with patient advised to come in early next week for follow up and if any blood sugars are over 350 to go to ER for follow up. Patient verbalized understanding. Thanks!

## 2018-01-12 ENCOUNTER — Telehealth: Payer: Self-pay | Admitting: Family Medicine

## 2018-01-12 NOTE — Telephone Encounter (Signed)
Received a call from the answering service that patient had left a message that her insulin is causing to have a skin reaction. Patient was started on Levemir and Humalog on 01/07/2018.  Attempted to call patient back at the telephone number provided to the answering service and received a voicemail left a detailed voice message advising patient that she would need to go directly to the emergency department and or [facility] for further evaluation and management as I could not manage any type of possible allergic reaction via phone.  She was also advised if she had any additional questions she could contact the answering service again.   Godfrey PickKimberly S. Tiburcio PeaHarris, MSN, FNP-C [facility] Care [facility]  [address] [place], [place] [address] [phone]

## 2018-01-14 ENCOUNTER — Other Ambulatory Visit: Payer: Self-pay

## 2018-01-14 ENCOUNTER — Ambulatory Visit (INDEPENDENT_AMBULATORY_CARE_PROVIDER_SITE_OTHER): Payer: Self-pay | Admitting: Family Medicine

## 2018-01-14 ENCOUNTER — Emergency Department (HOSPITAL_COMMUNITY): Payer: Self-pay

## 2018-01-14 ENCOUNTER — Emergency Department (HOSPITAL_COMMUNITY)
Admission: EM | Admit: 2018-01-14 | Discharge: 2018-01-14 | Disposition: A | Discharge status: Home / Self Care | Payer: Self-pay | Attending: Emergency Medicine | Admitting: Emergency Medicine

## 2018-01-14 ENCOUNTER — Ambulatory Visit: Payer: No Typology Code available for payment source

## 2018-01-14 ENCOUNTER — Encounter (HOSPITAL_COMMUNITY): Payer: Self-pay | Admitting: Emergency Medicine

## 2018-01-14 VITALS — BP 140/76 | HR 120 | Temp 98.1°F | Resp 16 | Ht 64.0 in | Wt 257.0 lb

## 2018-01-14 DIAGNOSIS — R739 Hyperglycemia, unspecified: Secondary | ICD-10-CM

## 2018-01-14 DIAGNOSIS — R000 Tachycardia, unspecified: Secondary | ICD-10-CM

## 2018-01-14 DIAGNOSIS — E1165 Type 2 diabetes mellitus with hyperglycemia: Secondary | ICD-10-CM | POA: Insufficient documentation

## 2018-01-14 DIAGNOSIS — Z794 Long term (current) use of insulin: Secondary | ICD-10-CM

## 2018-01-14 DIAGNOSIS — D573 Sickle-cell trait: Secondary | ICD-10-CM | POA: Insufficient documentation

## 2018-01-14 DIAGNOSIS — R1010 Upper abdominal pain, unspecified: Secondary | ICD-10-CM

## 2018-01-14 DIAGNOSIS — I10 Essential (primary) hypertension: Secondary | ICD-10-CM | POA: Insufficient documentation

## 2018-01-14 DIAGNOSIS — J449 Chronic obstructive pulmonary disease, unspecified: Secondary | ICD-10-CM | POA: Insufficient documentation

## 2018-01-14 DIAGNOSIS — R112 Nausea with vomiting, unspecified: Secondary | ICD-10-CM | POA: Insufficient documentation

## 2018-01-14 DIAGNOSIS — Z8639 Personal history of other endocrine, nutritional and metabolic disease: Secondary | ICD-10-CM

## 2018-01-14 DIAGNOSIS — R1084 Generalized abdominal pain: Secondary | ICD-10-CM | POA: Insufficient documentation

## 2018-01-14 DIAGNOSIS — R197 Diarrhea, unspecified: Secondary | ICD-10-CM | POA: Insufficient documentation

## 2018-01-14 DIAGNOSIS — F17210 Nicotine dependence, cigarettes, uncomplicated: Secondary | ICD-10-CM | POA: Insufficient documentation

## 2018-01-14 DIAGNOSIS — E119 Type 2 diabetes mellitus without complications: Secondary | ICD-10-CM

## 2018-01-14 DIAGNOSIS — Z79899 Other long term (current) drug therapy: Secondary | ICD-10-CM | POA: Insufficient documentation

## 2018-01-14 LAB — COMPREHENSIVE METABOLIC PANEL
ALT: 54 U/L (ref 14–54)
AST: 151 U/L — AB (ref 15–41)
Albumin: 4.2 g/dL (ref 3.5–5.0)
Alkaline Phosphatase: 94 U/L (ref 38–126)
Anion gap: 16 — ABNORMAL HIGH (ref 5–15)
BILIRUBIN TOTAL: 0.6 mg/dL (ref 0.3–1.2)
BUN: 12 mg/dL (ref 6–20)
CO2: 20 mmol/L — ABNORMAL LOW (ref 22–32)
CREATININE: 1.31 mg/dL — AB (ref 0.44–1.00)
Calcium: 10.3 mg/dL (ref 8.9–10.3)
Chloride: 103 mmol/L (ref 101–111)
GFR, EST AFRICAN AMERICAN: 58 mL/min — AB (ref >60)
GFR, EST NON AFRICAN AMERICAN: 50 mL/min — AB (ref >60)
Glucose, Bld: 150 mg/dL — ABNORMAL HIGH (ref 65–99)
Potassium: 3.6 mmol/L (ref 3.5–5.1)
Sodium: 139 mmol/L (ref 135–145)
TOTAL PROTEIN: 8.4 g/dL — AB (ref 6.5–8.1)

## 2018-01-14 LAB — CBC
HCT: 33.4 % — ABNORMAL LOW (ref 36.0–46.0)
Hemoglobin: 10.1 g/dL — ABNORMAL LOW (ref 12.0–15.0)
MCH: 21.4 pg — ABNORMAL LOW (ref 26.0–34.0)
MCHC: 30.2 g/dL (ref 30.0–36.0)
MCV: 70.6 fL — ABNORMAL LOW (ref 78.0–100.0)
PLATELETS: 327 10*3/uL (ref 150–400)
RBC: 4.73 MIL/uL (ref 3.87–5.11)
RDW: 21.4 % — AB (ref 11.5–15.5)
WBC: 13.2 10*3/uL — AB (ref 4.0–10.5)

## 2018-01-14 LAB — POCT URINALYSIS DIP (DEVICE)
Bilirubin Urine: NEGATIVE
Glucose, UA: 500 mg/dL — AB
Leukocytes, UA: NEGATIVE
NITRITE: NEGATIVE
PH: 5.5 (ref 5.0–8.0)
PROTEIN: NEGATIVE mg/dL
Specific Gravity, Urine: 1.01 (ref 1.005–1.030)
Urobilinogen, UA: 0.2 mg/dL (ref 0.0–1.0)

## 2018-01-14 LAB — GLUCOSE, POCT (MANUAL RESULT ENTRY): POC GLUCOSE: 366 mg/dL — AB (ref 70–99)

## 2018-01-14 LAB — I-STAT CG4 LACTIC ACID, ED: LACTIC ACID, VENOUS: 2.46 mmol/L — AB (ref 0.5–1.9)

## 2018-01-14 LAB — I-STAT BETA HCG BLOOD, ED (MC, WL, AP ONLY)

## 2018-01-14 LAB — LIPASE, BLOOD: Lipase: 25 U/L (ref 11–51)

## 2018-01-14 LAB — CBG MONITORING, ED
Glucose-Capillary: 164 mg/dL — ABNORMAL HIGH (ref 65–99)
Glucose-Capillary: 85 mg/dL (ref 65–99)

## 2018-01-14 MED ORDER — SODIUM CHLORIDE 0.9 % IV BOLUS: 500.0000 mL | Freq: Once | INTRAVENOUS | Status: DC

## 2018-01-14 MED ORDER — FENTANYL CITRATE (PF) 100 MCG/2ML IJ SOLN
100.0000 ug | Freq: Once | INTRAMUSCULAR | Status: AC
  Administered 2018-01-14: 100 ug via INTRAVENOUS
  Filled 2018-01-14: qty 2

## 2018-01-14 MED ORDER — ONDANSETRON HCL 4 MG/2ML IJ SOLN
4.0000 mg | Freq: Once | INTRAMUSCULAR | Status: AC
  Administered 2018-01-14: 4 mg via INTRAVENOUS
  Filled 2018-01-14: qty 2

## 2018-01-14 MED ORDER — ONDANSETRON HCL 4 MG PO TABS: 4.0000 mg | ORAL_TABLET | Freq: Three times a day (TID) | ORAL | 0 refills | Status: DC | PRN

## 2018-01-14 MED ORDER — SODIUM CHLORIDE 0.9 % IV BOLUS
500.0000 mL | Freq: Once | INTRAVENOUS | Status: AC
  Administered 2018-01-14: 500 mL via INTRAVENOUS

## 2018-01-14 MED ORDER — NON FORMULARY
15.0000 [IU] | Freq: Once | Status: AC
  Administered 2018-01-14: 15 [IU] via SUBCUTANEOUS

## 2018-01-14 MED ORDER — SODIUM CHLORIDE 0.9 % IV BOLUS: 1000.0000 mL | Freq: Once | INTRAVENOUS | Status: DC

## 2018-01-14 MED ORDER — ONDANSETRON 4 MG PO TBDP
4.0000 mg | ORAL_TABLET | Freq: Once | ORAL | Status: AC | PRN
  Administered 2018-01-14: 4 mg via ORAL
  Filled 2018-01-14: qty 1

## 2018-01-14 NOTE — Progress Notes (Signed)
Subjective:     Jill Shaw is an 42 y.o. female who presents for follow up of diabetes. Medications were changed 1 week ago due to fluctuating blood sugars. Patient notified the on-call services on yesterday complaining of allergic reaction from insulin. She says that reaction was a localized rash to right breast and left thigh. Blood sugar is 366 on arrival. Patient says that blood sugars have been extremely elevated over the past several weeks.  Current symptoms include: nausea and weight loss. Patient denies hypoglycemia , paresthesia of the feet and visual disturbances. Most recent hemoglobin a1C was 8.5.  Patient has been taking medications inconsistently.  She states the blood sugars have been running high and will not read on meter at times.  Patient has been taking Levemir 25 units at night and in a.m.  She is not on sliding scale yet she has been adjusting short acting medications.  Patient's medications were recently changed.  Patient was unable to tolerate 75/25 NovoLog mix.  Patient was seen 1 week ago and her blood sugar was 42 at that time. On today, blood sugar is 366 on arrival.  Past Medical History:  Diagnosis Date  . Anemia   . Arthritis    knees, hands  . Asthma   . COPD (chronic obstructive pulmonary disease) (HCC)   . Diabetes mellitus without complication (HCC)    type 2  . GERD (gastroesophageal reflux disease)   . Hypertension   . Neuromuscular disorder (HCC)    neuropathy feet  . Seizures (HCC)    pt states r/t stress and blood sugar - no meds last one 4 months ago, not seen neurologist  . Sickle cell trait (HCC)   . Smoker    Social History   Socioeconomic History  . Marital status: Single    Spouse name: Not on file  . Number of children: Not on file  . Years of education: Not on file  . Highest education level: Not on file  Occupational History  . Not on file  Social Needs  . Financial resource strain: Not on file  . Food insecurity:    Worry: Not  on file    Inability: Not on file  . Transportation needs:    Medical: Not on file    Non-medical: Not on file  Tobacco Use  . Smoking status: Current Every Day Smoker    Packs/day: 0.25    Years: 15.00    Pack years: 3.75    Types: Cigarettes  . Smokeless tobacco: Never Used  Substance and Sexual Activity  . Alcohol use: No  . Drug use: No  . Sexual activity: Not Currently    Birth control/protection: None  Lifestyle  . Physical activity:    Days per week: Not on file    Minutes per session: Not on file  . Stress: Not on file  Relationships  . Social connections:    Talks on phone: Not on file    Gets together: Not on file    Attends religious service: Not on file    Active member of club or organization: Not on file    Attends meetings of clubs or organizations: Not on file    Relationship status: Not on file  . Intimate partner violence:    Fear of current or ex partner: Not on file    Emotionally abused: Not on file    Physically abused: Not on file    Forced sexual activity: Not on file  Other  Topics Concern  . Not on file  Social History Narrative  . Not on file   Immunization History  Administered Date(s) Administered  . Pneumococcal Polysaccharide-23 03/28/2015  . Tdap 03/27/2016  Review of Systems  Constitutional: Positive for diaphoresis.  HENT: Negative.   Eyes: Negative.   Respiratory: Negative.  Negative for shortness of breath.   Cardiovascular: Negative.  Negative for chest pain and palpitations.  Gastrointestinal: Positive for abdominal pain and nausea.  Genitourinary: Negative.  Negative for dysuria, frequency and urgency.  Musculoskeletal: Negative.   Skin: Negative.   Neurological: Positive for dizziness.  Endo/Heme/Allergies: Negative.   Psychiatric/Behavioral: Negative.  Negative for depression and suicidal ideas.   Objective:  Physical Exam  Constitutional: She appears ill. She appears distressed.  Cardiovascular: Normal heart sounds.  Tachycardia present.  Pulses:      Carotid pulses are 2+ on the right side, and 2+ on the left side.      Radial pulses are 2+ on the right side, and 2+ on the left side.       Femoral pulses are 2+ on the right side, and 2+ on the left side.      Popliteal pulses are 2+ on the right side, and 2+ on the left side.       Dorsalis pedis pulses are 2+ on the right side, and 2+ on the left side.       Posterior tibial pulses are 2+ on the right side, and 2+ on the left side.  Pulmonary/Chest: Tachypnea noted.  Abdominal: Soft. Bowel sounds are decreased. There is no rebound and no CVA tenderness.  Skin: She is diaphoretic.   Assessment:    Diabetes mellitus Type II, under poor control.    Plan:  Type 2 diabetes mellitus without complication, with long-term current use of insulin (HCC) CBG 366 on arrival,  patient diaphoretic, and heart rate is 120.  Also, administer 15 units of Humalog on arrival. Reviewed EKG, shows sinus tachycardia.  Patient has lost 13 pounds over the past week. Patient transitioned to emergency department for further work up and evaluation.  Spoke with Sarita Bottom, Charge RN at [facility] Emergency department. Patient advised to report to the emergency department immediately for further work up and evaluation.   - Glucose (CBG)  2. Tachycardia - EKG 12-Lead  Nolon Nations  MSN, FNP-C Patient Care [facility] [address] [place], [place] [address] [phone]

## 2018-01-14 NOTE — ED Provider Notes (Signed)
[facility] DEPT Provider Note   CSN: [id number] Arrival date & time: 01/14/18  1424     History   Chief Complaint Chief Complaint  Patient presents with  . Abdominal Pain    HPI Jill Shaw is a 42 y.o. female with a history of insulin-dependent type 2 diabetes, COPD, chronic anemia, GERD, hypertension who was sent over by PCP to the emergency department today for abdominal pain and hyperglycemia.  Patient reports that her PCP changed her blood pressure medication 1 week ago from NovoLog 75/25 to Levemir 25 units at night and in the a.m.  She is also reported to be on sliding scale with meals.  She was changed that she was noted to be hypoglycemic last week on arrival, with a blood sugar of 42.  She states that since the change of her medication the patient has had high blood sugars with readings in the 300s, 400s and this morning a reading that stated high.  She notes that 2 days after starting the medication she became nauseous and has had daily episodes of nonbloody, non-melanous diarrhea along with "generalized abdominal cramping".  She reports that 2 days ago she had one episode of nonbilious, nonbloody emesis.  She reports associated anorexia with last p.o. intake 2 days ago.  Patient denies any emesis or diarrhea today.  She still states that she is nauseous.  Patient's last A1c was 8.5.  She was seen by her PCP and found to have a CBG of 366 on arrival.  She was given 15 units of Humalog.  On arrival to the ED the patient CBG was 164.  The patient reports she is still passing gas.  She denies any associated urinary symptoms.  No fever, chest pain, shortness of breath at home.  She notes previous C-section but otherwise no abdominal surgery.  HPI  Past Medical History:  Diagnosis Date  . Anemia   . Arthritis    knees, hands  . Asthma   . COPD (chronic obstructive pulmonary disease) ([place])   . Diabetes mellitus without complication ([place])    type 2   . GERD (gastroesophageal reflux disease)   . Hypertension   . Neuromuscular disorder (HCC)    neuropathy feet  . Seizures ([place])    pt states r/t stress and blood sugar - no meds last one 4 months ago, not seen neurologist  . Sickle cell trait ([place])   . Smoker     Patient Active Problem List   Diagnosis Date Noted  . Menorrhagia with irregular cycle 05/17/2017  . Anemia of chronic disease 04/06/2017  . Knee pain, chronic 03/27/2016  . Type 2 diabetes mellitus without complication, with long-term current use of insulin ([place]) 03/27/2016  . Essential hypertension 03/27/2016  . Morbid obesity ([place]) 03/27/2016  . Irritable bowel syndrome with constipation 03/27/2016  . Tobacco dependence 03/27/2016    Past Surgical History:  Procedure Laterality Date  . CESAREAN SECTION     x 1  . EYE SURGERY Bilateral    laser right and cataract removed left eye  . UPPER GI ENDOSCOPY  07/2017     OB History   None      Home Medications    Prior to Admission medications   Medication Sig Start Date End Date Taking? Authorizing Provider  albuterol (PROVENTIL HFA;VENTOLIN HFA) 108 (90 Base) MCG/ACT inhaler Inhale 2 puffs into the lungs every 6 (six) hours as needed for wheezing or shortness of breath. 04/01/17  Dorena Dew, FNP  Blood Glucose Monitoring Suppl (TRUE METRIX METER) w/Device KIT 1 each by Does not apply route 4 (four) times daily -  before meals and at bedtime. 12/06/16   Dorena Dew, FNP  budesonide-formoterol (SYMBICORT) 160-4.5 MCG/ACT inhaler Inhale 2 puffs into the lungs 2 (two) times daily. 01/07/18   Dorena Dew, FNP  Ferrous Sulfate (IRON) 325 (65 Fe) MG TABS Take 1 tablet (325 mg total) by mouth daily. 06/21/17   Dorena Dew, FNP  fluticasone (FLONASE) 50 MCG/ACT nasal spray Place 2 sprays into both nostrils daily. 06/21/17   Dorena Dew, FNP  glucose blood (FREESTYLE TEST STRIPS) test strip Use as instructed 11/21/16   Dorena Dew, FNP    glucose blood (TRUE METRIX BLOOD GLUCOSE TEST) test strip Use as instructed 12/06/16   Dorena Dew, FNP  hydrochlorothiazide (HYDRODIURIL) 25 MG tablet TAKE 1 TABLETS(25 MG) BY MOUTH DAILY 08/31/17   Dorena Dew, FNP  HYDROcodone-acetaminophen (LORTAB) 7.5-325 MG tablet Take 1 tablet every 6 (six) hours as needed by mouth for moderate pain. 08/24/17   Edrick Kins, DPM  HYDROcodone-acetaminophen (NORCO) 7.5-325 MG tablet Take 1 tablet by mouth every 6 (six) hours as needed for moderate pain. 10/17/17   Edrick Kins, DPM  insulin detemir (LEVEMIR) 100 unit/ml SOLN Inject 0.25 mLs (25 Units total) into the skin at bedtime. 01/07/18   Dorena Dew, FNP  insulin lispro (HUMALOG) 100 UNIT/ML KwikPen Junior Inject 0.04 mLs (4 Units total) into the skin 3 (three) times daily. 01/07/18   Dorena Dew, FNP  losartan (COZAAR) 50 MG tablet Take 1 tablet (50 mg total) by mouth daily. 06/21/17   Dorena Dew, FNP  metoprolol succinate (TOPROL-XL) 25 MG 24 hr tablet Take 0.5 tablets (12.5 mg total) by mouth daily. 11/01/17   Dorena Dew, FNP  omeprazole (PRILOSEC) 40 MG capsule Take 1 capsule (40 mg total) by mouth daily. 12/20/17   Dorena Dew, FNP  potassium chloride (K-DUR) 10 MEQ tablet Take 1 tablet (10 mEq total) by mouth daily. 12/20/17   Dorena Dew, FNP  pregabalin (LYRICA) 75 MG capsule Take 1 capsule (75 mg total) by mouth 2 (two) times daily. 10/18/17   Charlott Rakes, MD  saxagliptin HCl (ONGLYZA) 2.5 MG TABS tablet Take 1 tablet (2.5 mg total) by mouth daily. 06/21/17   Dorena Dew, FNP  tizanidine (ZANAFLEX) 6 MG capsule TAKE 1 CAPSULE(6 MG) BY MOUTH THREE TIMES DAILY 10/01/17   Dorena Dew, FNP  Vitamin D, Ergocalciferol, (DRISDOL) 50000 units CAPS capsule Take 1 capsule (50,000 Units total) by mouth every 7 (seven) days. 06/21/17   Dorena Dew, FNP    Family History Family History  Problem Relation Age of Onset  . Diabetes Mother   .  Hypertension Mother     Social History Social History   Tobacco Use  . Smoking status: Current Every Day Smoker    Packs/day: 0.25    Years: 15.00    Pack years: 3.75    Types: Cigarettes  . Smokeless tobacco: Never Used  Substance Use Topics  . Alcohol use: No  . Drug use: No     Allergies   Aspirin; Cortizone-10 [hydrocortisone]; Gabapentin; Ibuprofen; Ketoprofen; Liraglutide; Naproxen; Omeprazole-sodium bicarbonate; Sulfa antibiotics; and Tramadol   Review of Systems Review of Systems  All other systems reviewed and are negative.    Physical Exam Updated Vital Signs BP (!) 111/97   Pulse Marland Kitchen)  124   Temp 98.6 F (37 C) (Oral)   Resp (!) 24   Ht _0  (1.626 m)   Wt 115.7 kg (255 lb)   LMP 01/06/2018   SpO2 100%   BMI 43.77 kg/m   Physical Exam  Constitutional: She appears well-developed and well-nourished.  HENT:  Head: Normocephalic and atraumatic.  Right Ear: External ear normal.  Left Ear: External ear normal.  Nose: Nose normal.  Mouth/Throat: Uvula is midline, oropharynx is clear and moist and mucous membranes are normal. No tonsillar exudate.  Eyes: Pupils are equal, round, and reactive to light. Right eye exhibits no discharge. Left eye exhibits no discharge. No scleral icterus.  Neck: Trachea normal. Neck supple. No spinous process tenderness present. No neck rigidity. Normal range of motion present.  Cardiovascular: Normal rate, regular rhythm and intact distal pulses.  No murmur heard. Pulses:      Radial pulses are 2+ on the right side, and 2+ on the left side.       Dorsalis pedis pulses are 2+ on the right side, and 2+ on the left side.       Posterior tibial pulses are 2+ on the right side, and 2+ on the left side.  No lower extremity swelling or edema. Calves symmetric in size bilaterally.  Pulmonary/Chest: Effort normal. She has rhonchi in the right lower field and the left lower field. She exhibits no tenderness.  Abdominal: Soft. Bowel  sounds are normal. She exhibits no distension. There is generalized tenderness. There is no rigidity, no rebound, no guarding, no CVA tenderness and no tenderness at McBurney's point.  Generalized abdominal tenderness that is worse in the upper fields.  No focal tenderness.  No rigidity, rebound or guarding.   Musculoskeletal: She exhibits no edema.  Lymphadenopathy:    She has no cervical adenopathy.  Neurological: She is alert.  Skin: Skin is warm, dry and intact. Capillary refill takes less than 2 seconds. No rash noted. She is not diaphoretic.  Psychiatric: She has a normal mood and affect.  Nursing note and vitals reviewed.    ED Treatments / Results  Labs (all labs ordered are listed, but only abnormal results are displayed) Labs Reviewed  COMPREHENSIVE METABOLIC PANEL - Abnormal; Notable for the following components:      Result Value   CO2 20 (*)    Glucose, Bld 150 (*)    Creatinine, Ser 1.31 (*)    Total Protein 8.4 (*)    AST 151 (*)    GFR calc non Af Amer 50 (*)    GFR calc Af Amer 58 (*)    Anion gap 16 (*)    All other components within normal limits  CBC - Abnormal; Notable for the following components:   WBC 13.2 (*)    Hemoglobin 10.1 (*)    HCT 33.4 (*)    MCV 70.6 (*)    MCH 21.4 (*)    RDW 21.4 (*)    All other components within normal limits  CBG MONITORING, ED - Abnormal; Notable for the following components:   Glucose-Capillary 164 (*)    All other components within normal limits  LIPASE, BLOOD  URINALYSIS, ROUTINE W REFLEX MICROSCOPIC  BLOOD GAS, VENOUS  I-STAT BETA HCG BLOOD, ED ([facility], [facility], [facility] ONLY)  I-STAT CG4 LACTIC ACID, ED    EKG EKG Interpretation  Date/Time:  Monday January 14 2018 15:46:17 EDT Ventricular Rate:  118 PR Interval:    QRS Duration: 84 QT Interval:  328 QTC Calculation: 460 R Axis:   68 Text Interpretation:  Sinus tachycardia Confirmed by Quintella Reichert [id number]) on 01/14/2018 5:40:38 PM   Radiology No results  found.  Procedures Procedures (including critical care time)  Medications Ordered in ED Medications  sodium chloride 0.9 % bolus 500 mL (has no administration in time range)  ondansetron (ZOFRAN) injection 4 mg (has no administration in time range)  fentaNYL (SUBLIMAZE) injection 100 mcg (has no administration in time range)  ondansetron (ZOFRAN-ODT) disintegrating tablet 4 mg (4 mg Oral Given 01/14/18 1520)     Initial Impression / Assessment and Plan / ED Course  I have reviewed the triage vital signs and the nursing notes.  Pertinent labs & imaging results that were available during my care of the patient were reviewed by me and considered in my medical decision making (see chart for details).     42 y.o. female with a history of insulin-dependent type 2 diabetes, COPD, chronic anemia, GERD, hypertension who was sent over by PCP to the emergency department today for abdominal pain and hyperglycemia.  Patient reports that her PCP changed her blood pressure medication 1 week ago from NovoLog 75/25 to Levemir 25 units at night and in the a.m.  She is also reported to be on sliding scale with meals.  She was changed that she was noted to be hypoglycemic last week on arrival, with a blood sugar of 42.  She states that since the change of her medication the patient has had high blood sugars with readings in the 300s, 400s and this morning a reading that stated high.  She notes that 2 days after starting the medication she became nauseous and has had daily episodes of nonbloody, non-melanous diarrhea along with "generalized abdominal cramping".  She reports that 2 days ago she had one episode of nonbilious, nonbloody emesis.  She reports associated anorexia with last p.o. intake 2 days ago.  Patient denies any emesis or diarrhea today.  She still states that she is nauseous.  Patient's last A1c was 8.5.  She was seen by her PCP and found to have a CBG of 366 on arrival.  She was given 15 units of  Humalog.  On arrival to the ED the patient CBG was 164.  The patient reports she is still passing gas.  She denies any associated urinary symptoms.  No fever, chest pain, shortness of breath at home.  She notes previous C-section but otherwise no abdominal surgery.  Patient noted to be tachycardic and mildly tachypneic on presentation.  Will send lactic acid.  No fever, hypoxia or hypotension.  On exam the patient does have some mild rhonchi of the lower lung fields.  Will obtain chest x-ray.  No reported URI symptoms.  Patient with diffuse tenderness palpation of the abdomen.  This is greater in the upper abdomen.  Will obtain right upper quadrant ultrasound.  Screening labs done prior to my assessment show negative pregnancy test.  Lipase within normal limits.  No significant electrolyte derangements.  Patient does have a anion gap of 16 with a bicarb of 20.  Patient's creatinine also elevated at 1.31 from baseline of 1.0.  She has had a decreased oral intake as well as emesis and diarrhea over this last week.  Possible this is related to patient's dehydrated state.  Will give IV fluids.  Patient's blood sugar 150.  She is noted on oral SGLT2 inhibitors.  Low suspicion for DKA at this time however will obtain VBG  to evaluate.  There is elevation of AST but LFTs otherwise within normal limits.  Patient does have a leukocytosis of 13.2.  Anemia that is up from patient's baseline.  Chest x-ray without evidence of cardiopulmonary disease.  EKG was sinus tachycardia.  Right upper quadrant ultrasound without evidence of gallstone or biliary dilation.  Repeat CBG less than 100. No hypoglycemia.  Lactic acid 2.46.  Suspect this is secondary to patient's dehydrated state with emesis, diarrhea and increased creatinine.  Will repeat after IV fluids.  Do not feel the patient is septic at this time.  Repeat abdominal exam without tenderness.  Awaiting UA, VBG and repeat lactic acid.  Patient's pain currently controlled.   She has been without emesis in the department.  Patient states that after IV fluids she feels greatly improved.  She is requesting to be discharged home and not liked to wait for further testing. We discussed the nature and purpose, risks and benefits, as well as, the alternatives of treatment. Time was given to allow the opportunity to ask questions and consider their options, and after the discussion, the patient decided to refuse the offerred treatment. The patient was informed that refusal could lead to, but was not limited to, death, permanent disability, or severe pain. If present, I asked the relatives or significant others to dissuade them without success. Prior to refusing, I determined that the patient had the capacity to make their decision and understood the consequences of that decision. After refusal, I made every reasonable opportunity to treat them to the best of my ability.  The patient was notified that they may return to the emergency department at any time for further treatment.    Patient case discussed with Dr. Ralene Bathe who is in agreement with plan.  Final Clinical Impressions(s) / ED Diagnoses   Final diagnoses:  Upper abdominal pain  Generalized abdominal pain  Hyperglycemia  History of diabetes mellitus  Nausea vomiting and diarrhea    ED Discharge Orders        Ordered    ondansetron (ZOFRAN) 4 MG tablet  Every 8 hours PRN     01/14/18 2041       Lorelle Gibbs 01/15/18 Rod Mae, MD 01/19/18 [phone]

## 2018-01-14 NOTE — Discharge Instructions (Signed)
Your seen here today for abdominal pain, nausea, vomiting and diarrhea. Your workup was overall reassuring however was not completed prior to discharge.  You have decided to leave AGAINST MEDICAL ADVICE.  You can return at any time.  Please note that injury and death can occur by leaving AGAINST MEDICAL ADVICE.  Please follow with your primary care provider tomorrow.

## 2018-01-14 NOTE — ED Triage Notes (Signed)
Patient c/o hyperglycemia with abdominal pain with N/V/D x4 days. Ambulatory.

## 2018-01-14 NOTE — ED Notes (Signed)
Pt is alert and orinted x 4 pt HR was in the 140's. Pt reports that her potassium has been elevated along with her blood sugar and has been having cramping in her hands and legs. Pt states that she has been HOH in the rt ear.

## 2018-01-21 ENCOUNTER — Ambulatory Visit: Payer: No Typology Code available for payment source | Admitting: Family Medicine

## 2018-01-21 ENCOUNTER — Ambulatory Visit (INDEPENDENT_AMBULATORY_CARE_PROVIDER_SITE_OTHER): Payer: Self-pay | Admitting: Family Medicine

## 2018-01-21 ENCOUNTER — Encounter: Payer: Self-pay | Admitting: Family Medicine

## 2018-01-21 VITALS — BP 134/71 | HR 89 | Temp 98.5°F | Resp 18 | Ht 64.0 in | Wt 268.0 lb

## 2018-01-21 DIAGNOSIS — F17200 Nicotine dependence, unspecified, uncomplicated: Secondary | ICD-10-CM

## 2018-01-21 DIAGNOSIS — E119 Type 2 diabetes mellitus without complications: Secondary | ICD-10-CM

## 2018-01-21 DIAGNOSIS — Z794 Long term (current) use of insulin: Secondary | ICD-10-CM

## 2018-01-21 DIAGNOSIS — I10 Essential (primary) hypertension: Secondary | ICD-10-CM

## 2018-01-21 LAB — POCT URINALYSIS DIPSTICK
Bilirubin, UA: NEGATIVE
GLUCOSE UA: 250
KETONES UA: NEGATIVE
LEUKOCYTES UA: NEGATIVE
Nitrite, UA: NEGATIVE
RBC UA: NEGATIVE
Urobilinogen, UA: 1 E.U./dL
pH, UA: 5.5 (ref 5.0–8.0)

## 2018-01-21 LAB — GLUCOSE, POCT (MANUAL RESULT ENTRY): POC Glucose: 253 mg/dl — AB (ref 70–99)

## 2018-01-21 MED ORDER — GLUCOSE BLOOD VI STRP: ORAL_STRIP | 12 refills | Status: DC

## 2018-01-21 MED ORDER — TRUE METRIX METER W/DEVICE KIT: 1.0000 | PACK | Freq: Three times a day (TID) | 0 refills | Status: DC

## 2018-01-21 MED ORDER — INSULIN LISPRO PROT & LISPRO (75-25 MIX) 100 UNIT/ML KWIKPEN
40.0000 [IU] | PEN_INJECTOR | Freq: Two times a day (BID) | SUBCUTANEOUS | 11 refills | Status: DC
Start: 2018-01-21 — End: 2018-02-13

## 2018-01-21 MED FILL — !TRUE METRIX BLOOD GLUCOSE: 365 days supply | Qty: 1 | Fill #0

## 2018-01-21 MED FILL — TRUE METRIX TEST STRIP: 30 days supply | Qty: 100 | Fill #0

## 2018-01-21 NOTE — Progress Notes (Signed)
Subjective:  Jill Shaw, a 42-year-old female with a history of uncontrolled hypertension and type 2 diabetes mellitus presents accompanied by mother for a follow-up of chronic conditions.    Jill Shaw has a history of type 2 diabetes mellitus. Blood sugar 253 on arrival. Patient has not been taking anti diabetic medications over the past week. She says that she "feels terrible" after taking Levemir. Also, she has a difficult time complying with meal coverage. She typically sleeps until noon and does not eat breakfast. Patient denies blurred vision, dizziness, polyuria, polydipsia, or polyphagia.   Jill Shaw says that she is taking antihypertensive medications consistently.  Patient has a history of morbid obesity and is not following an exercise routine or eating a low-fat, low carbohydrate diet.  She also does not check blood pressures at home.  Cardiac symptoms include trace swelling to bilateral lower extremities.  Patient denies chest pains, palpitations, syncope, fatigue, dizziness or shortness of breath.  Cardiac risk factors include, morbid obesity and chronic tobacco use.   Past Medical History:  Diagnosis Date  . Anemia   . Arthritis    knees, hands  . Asthma   . COPD (chronic obstructive pulmonary disease) (HCC)   . Diabetes mellitus without complication (HCC)    type 2  . GERD (gastroesophageal reflux disease)   . Hypertension   . Neuromuscular disorder (H[facility])    neuropathy feet  . Seizures ([facility])    pt states r/t stress and blood sugar - no meds last one 4 months ago, not seen neurologist  . Sickle cell trait ([facility])   . Smoker    Social History   Socioeconomic History  . Marital status: Single    Spouse name: Not on file  . Number of children: Not on file  . Years of education: Not on file  . Highest education level: Not on file  Occupational History  . Not on file  Social Needs  . Financial resource strain: Not on file  . Food insecurity:    Worry: Not on file   Inability: Not on file  . Transportation needs:    Medical: Not on file    Non-medical: Not on file  Tobacco Use  . Smoking status: Current Every Day Smoker    Packs/day: 0.25    Years: 15.00    Pack years: 3.75    Types: Cigarettes  . Smokeless tobacco: Never Used  Substance and Sexual Activity  . Alcohol use: No  . Drug use: No  . Sexual activity: Not Currently    Birth control/protection: None  Lifestyle  . Physical activity:    Days per week: Not on file    Minutes per session: Not on file  . Stress: Not on file  Relationships  . Social connections:    Talks on phone: Not on file    Gets together: Not on file    Attends religious service: Not on file    Active member of club or organization: Not on file    Attends meetings of clubs or organizations: Not on file    Relationship status: Not on file  . Intimate partner violence:    Fear of current or ex partner: Not on file    Emotionally abused: Not on file    Physically abused: Not on file    Forced sexual activity: Not on file  Other Topics Concern  . Not on file  Social History Narrative  . Not on file   Immunization History  Administered Date(s)   Administered  . Pneumococcal Polysaccharide-23 03/28/2015  . Tdap 03/27/2016   Allergies  Allergen Reactions  . Aspirin Nausea Only  . Cortizone-10 [Hydrocortisone] Nausea Only  . Gabapentin Nausea And Vomiting    upset stomach  . Ibuprofen Nausea Only and Nausea And Vomiting  . Ketoprofen Nausea And Vomiting  . Liraglutide Nausea And Vomiting  . Naproxen Nausea And Vomiting  . Omeprazole-Sodium Bicarbonate Nausea And Vomiting  . Sulfa Antibiotics Nausea And Vomiting  . Tramadol Nausea And Vomiting    stomach upset  .Review of Systems  Constitutional: Negative.  Negative for malaise/fatigue.  HENT: Negative.   Eyes: Negative.   Respiratory: Positive for shortness of breath.   Cardiovascular: Negative for chest pain and palpitations.  Gastrointestinal:  Negative.   Genitourinary: Negative.   Musculoskeletal: Negative.   Skin: Negative.   Neurological: Negative.   Endo/Heme/Allergies: Negative.   Psychiatric/Behavioral: Negative.     Objective:  Physical Exam  Constitutional: She is oriented to person, place, and time and well-developed, well-nourished, and in no distress.  HENT:  Head: Normocephalic and atraumatic.  Right Ear: External ear normal.  Left Ear: External ear normal.  Nose: Nose normal.  Mouth/Throat: Oropharynx is clear and moist.  Eyes: Pupils are equal, round, and reactive to light.  Neck: Normal range of motion. Neck supple.  Cardiovascular: Normal rate, regular rhythm, normal heart sounds and intact distal pulses.  Bilateral lower extremity edema (trace)  Pulmonary/Chest: Effort normal and breath sounds normal. No respiratory distress. She has no wheezes.  Abdominal: Soft. Bowel sounds are normal. She exhibits no distension. There is no tenderness. There is no rebound.  Musculoskeletal: Normal range of motion.  Neurological: She is alert and oriented to person, place, and time. She has normal reflexes. Gait normal. GCS score is 15.  Skin: Skin is warm and dry.  Psychiatric: Mood, memory, affect and judgment normal.   Assessment:   BP 134/71 (BP Location: Right Arm, Patient Position: Sitting, Cuff Size: Large)   Pulse 89   Temp 98.5 F (36.9 C) (Oral)   Resp 18   Ht 5' 4" (1.626 m)   Wt 268 lb (121.6 kg)   LMP 01/06/2018   SpO2 100%   BMI 46.00 kg/m  Plan:  1. Type 2 diabetes mellitus without complication, with long-term current use of insulin (HCC) Most recent hemoglobin a1C is 8.5. Patient was unable to tolerate Levemir and Novolog for meal coverage. She also had a difficult time complying with regimen. Will restart  Lispro Humalog mix 75/25 at 40 units BID. Discussed the importance of taking medications as prescribed in order to achieve positive outcomes.  Recommend a lowfat, low carbohydrate diet  divided over 5-6 small meals, increase water intake to 6-8 glasses, and 150 minutes per week of cardiovascular exercise.   - Glucose (CBG) - Insulin Lispro Prot & Lispro (HUMALOG MIX 75/25 KWIKPEN) (75-25) 100 UNIT/ML Kwikpen; Inject 40 Units into the skin 2 (two) times daily.  Dispense: 15 mL; Refill: 11 - Blood Glucose Monitoring Suppl (TRUE METRIX METER) w/Device KIT; 1 each by Does not apply route 4 (four) times daily -  before meals and at bedtime.  Dispense: 1 kit; Refill: 0 - glucose blood (TRUE METRIX BLOOD GLUCOSE TEST) test strip; Use as instructed  Dispense: 100 each; Refill: 12 - Basic Metabolic Panel - Urinalysis Dipstick  2. Essential hypertension Blood pressure is at goal, no medication changes warranted on today - Basic Metabolic Panel - Urinalysis Dipstick  3. Morbid obesity ([place]) Body  mass index is 46 kg/m. The patient is asked to make an attempt to improve diet and exercise patterns to aid in medical management of this problem.   4. Tobacco dependence Smoking cessation instruction/counseling given:  counseled patient on the dangers of tobacco use, advised patient to stop smoking, and reviewed strategies to maximize success    RTC: 3 months for chronic conditions    [place]   MSN, FNP-C [facility] [address]  [place], [place] [address] [phone]  

## 2018-01-22 ENCOUNTER — Encounter: Payer: Self-pay | Admitting: Family Medicine

## 2018-01-22 ENCOUNTER — Telehealth: Payer: Self-pay

## 2018-01-22 LAB — BASIC METABOLIC PANEL
BUN / CREAT RATIO: 12 (ref 9–23)
BUN: 9 mg/dL (ref 6–24)
CO2: 22 mmol/L (ref 20–29)
CREATININE: 0.78 mg/dL (ref 0.57–1.00)
Calcium: 8.9 mg/dL (ref 8.7–10.2)
Chloride: 101 mmol/L (ref 96–106)
GFR calc Af Amer: 109 mL/min/{1.73_m2} (ref >59)
GFR, EST NON AFRICAN AMERICAN: 95 mL/min/{1.73_m2} (ref >59)
Glucose: 206 mg/dL — ABNORMAL HIGH (ref 65–99)
Potassium: 4.3 mmol/L (ref 3.5–5.2)
SODIUM: 139 mmol/L (ref 134–144)

## 2018-01-22 NOTE — Telephone Encounter (Signed)
-----   Message from Massie MaroonLachina M Hollis, [place]NP sent at 01/22/2018  5:49 AM EDT ----- Regarding: lab results Please inform patient that kidney functioning has normalized since last week. Remind her of the importance of taking prescribed medications consistently in order to achieve positive outcomes. On yesterday, we restarted Humalog 75/25 at 40 units twice daily. Check CBGs prior to meals and at bedtime. Bring glucometer to follow up appointment in 2 weeks.   Nolon NationsLachina Moore Hollis  MSN, FNP-C [facility] [address]  [place], [place] [id number] [phone]

## 2018-01-22 NOTE — Telephone Encounter (Signed)
Called and left voicemail that kidney function has returned to normal. Reminded her that it is important to take medications consistently to achieve positive outcomes. Asked that she check blood sugar prior to meals and at bedtime and bring glucose meter to follow up in 2 weeks. Also reminded of us restarting the humalog 75/25 40 units twice daily and asked if she has any questions to call back to our office. Thanks!

## 2018-01-25 MED FILL — $HUMALOG MIX 75-25 KWIKPEN: (75-25) 100 | 11 days supply | Qty: 9 | Fill #0

## 2018-01-30 ENCOUNTER — Telehealth: Payer: Self-pay

## 2018-01-30 NOTE — Telephone Encounter (Signed)
Patient states blood sugar has been running high at 350-400 since the last insulin change. Please advise.

## 2018-01-31 ENCOUNTER — Other Ambulatory Visit: Payer: Self-pay

## 2018-01-31 MED FILL — !HUMALOG MIX 75/25 VIAL: (75-25) 100 | 12 days supply | Qty: 10 | Fill #0

## 2018-01-31 NOTE — Telephone Encounter (Signed)
Please advise 

## 2018-02-07 ENCOUNTER — Other Ambulatory Visit: Payer: Self-pay | Admitting: Family Medicine

## 2018-02-07 ENCOUNTER — Encounter: Payer: Self-pay | Admitting: Family Medicine

## 2018-02-08 MED FILL — !HUMALOG MIX 75/25 VIAL: (75-25) 100 | 12 days supply | Qty: 10 | Fill #0

## 2018-02-13 ENCOUNTER — Ambulatory Visit (INDEPENDENT_AMBULATORY_CARE_PROVIDER_SITE_OTHER): Payer: Self-pay | Admitting: Family Medicine

## 2018-02-13 ENCOUNTER — Encounter: Payer: Self-pay | Admitting: Family Medicine

## 2018-02-13 ENCOUNTER — Ambulatory Visit (HOSPITAL_COMMUNITY)
Admission: RE | Admit: 2018-02-13 | Discharge: 2018-02-13 | Disposition: A | Discharge status: Home / Self Care | Payer: No Typology Code available for payment source | Source: Ambulatory Visit | Attending: Family Medicine | Admitting: Family Medicine

## 2018-02-13 VITALS — BP 150/60 | HR 80 | Temp 98.6°F | Resp 14 | Ht 64.0 in | Wt 268.0 lb

## 2018-02-13 DIAGNOSIS — Z794 Long term (current) use of insulin: Secondary | ICD-10-CM

## 2018-02-13 DIAGNOSIS — E119 Type 2 diabetes mellitus without complications: Secondary | ICD-10-CM

## 2018-02-13 DIAGNOSIS — R7309 Other abnormal glucose: Secondary | ICD-10-CM

## 2018-02-13 DIAGNOSIS — F17200 Nicotine dependence, unspecified, uncomplicated: Secondary | ICD-10-CM

## 2018-02-13 LAB — POCT URINALYSIS DIP (MANUAL ENTRY)
BILIRUBIN UA: NEGATIVE
BILIRUBIN UA: NEGATIVE mg/dL
Glucose, UA: 500 mg/dL — AB
Leukocytes, UA: NEGATIVE
Nitrite, UA: NEGATIVE
PH UA: 5.5 (ref 5.0–8.0)
PROTEIN UA: NEGATIVE mg/dL
SPEC GRAV UA: 1.01 (ref 1.010–1.025)
Urobilinogen, UA: 0.2 E.U./dL

## 2018-02-13 LAB — COMPREHENSIVE METABOLIC PANEL
ALBUMIN: 3.6 g/dL (ref 3.5–5.0)
ALK PHOS: 101 U/L (ref 38–126)
ALT: 44 U/L (ref 14–54)
ANION GAP: 9 (ref 5–15)
AST: 170 U/L — AB (ref 15–41)
BILIRUBIN TOTAL: 0.8 mg/dL (ref 0.3–1.2)
BUN: 10 mg/dL (ref 6–20)
CALCIUM: 9.2 mg/dL (ref 8.9–10.3)
CO2: 24 mmol/L (ref 22–32)
Chloride: 106 mmol/L (ref 101–111)
Creatinine, Ser: 0.9 mg/dL (ref 0.44–1.00)
GFR calc Af Amer: >60 mL/min (ref >60)
GLUCOSE: 311 mg/dL — AB (ref 65–99)
POTASSIUM: 4.2 mmol/L (ref 3.5–5.1)
Sodium: 139 mmol/L (ref 135–145)
TOTAL PROTEIN: 7.4 g/dL (ref 6.5–8.1)

## 2018-02-13 LAB — POCT GLYCOSYLATED HEMOGLOBIN (HGB A1C): HEMOGLOBIN A1C: 8.6

## 2018-02-13 LAB — GLUCOSE, POCT (MANUAL RESULT ENTRY): POC Glucose: 410 mg/dl — AB (ref 70–99)

## 2018-02-13 MED ORDER — SODIUM CHLORIDE 0.9 % IV SOLN
INTRAVENOUS | Status: DC
  Administered 2018-02-13: 15:00:00 via INTRAVENOUS

## 2018-02-13 MED ORDER — NON FORMULARY
10.0000 [IU] | Freq: Once | Status: AC
  Administered 2018-02-13: 10 [IU] via SUBCUTANEOUS

## 2018-02-13 MED ORDER — INSULIN LISPRO PROT & LISPRO (75-25 MIX) 100 UNIT/ML KWIKPEN: 50.0000 [IU] | PEN_INJECTOR | Freq: Two times a day (BID) | SUBCUTANEOUS | 11 refills | Status: DC

## 2018-02-13 NOTE — Patient Instructions (Addendum)
Your blood sugar was 410 on arrival, you received 10 units of Humalog.  I have increased your Humalog 75/20 5 to 50 units twice daily.  Continue to check blood sugars consistently throughout the day.  Recommend carbohydrate modify diet divided over small meals throughout the day.  Also, increase your water intake to avoid dehydration. Recommend smoking cessation as discussed.   Body mass index is 46 kg/m.  Recommend low impact cardiovascular exercise several times a week.  Weight loss goal is to lose 10% of your body weight over the next 6 months, which is equivalent to about 26 pounds.    Diabetes Mellitus and Exercise Exercising regularly is important for your overall health, especially when you have diabetes (diabetes mellitus). Exercising is not only about losing weight. It has many health benefits, such as increasing muscle strength and bone density and reducing body fat and stress. This leads to improved fitness, flexibility, and endurance, all of which result in better overall health. Exercise has additional benefits for people with diabetes, including:  Reducing appetite.  Helping to lower and control blood glucose.  Lowering blood pressure.  Helping to control amounts of fatty substances (lipids) in the blood, such as cholesterol and triglycerides.  Helping the body to respond better to insulin (improving insulin sensitivity).  Reducing how much insulin the body needs.  Decreasing the risk for heart disease by: ? Lowering cholesterol and triglyceride levels. ? Increasing the levels of good cholesterol. ? Lowering blood glucose levels.  What is my activity plan? Your health care provider or certified diabetes educator can help you make a plan for the type and frequency of exercise (activity plan) that works for you. Make sure that you:  Do at least 150 minutes of moderate-intensity or vigorous-intensity exercise each week. This could be brisk walking, biking, or water  aerobics. ? Do stretching and strength exercises, such as yoga or weightlifting, at least 2 times a week. ? Spread out your activity over at least 3 days of the week.  Get some form of physical activity every day. ? Do not go more than 2 days in a row without some kind of physical activity. ? Avoid being inactive for more than 90 minutes at a time. Take frequent breaks to walk or stretch.  Choose a type of exercise or activity that you enjoy, and set realistic goals.  Start slowly, and gradually increase the intensity of your exercise over time.  What do I need to know about managing my diabetes?  Check your blood glucose before and after exercising. ? If your blood glucose is higher than 240 mg/dL (14.7 mmol/L) before you exercise, check your urine for ketones. If you have ketones in your urine, do not exercise until your blood glucose returns to normal.  Know the symptoms of low blood glucose (hypoglycemia) and how to treat it. Your risk for hypoglycemia increases during and after exercise. Common symptoms of hypoglycemia can include: ? Hunger. ? Anxiety. ? Sweating and feeling clammy. ? Confusion. ? Dizziness or feeling light-headed. ? Increased heart rate or palpitations. ? Blurry vision. ? Tingling or numbness around the mouth, lips, or tongue. ? Tremors or shakes. ? Irritability.  Keep a rapid-acting carbohydrate snack available before, during, and after exercise to help prevent or treat hypoglycemia.  Avoid injecting insulin into areas of the body that are going to be exercised. For example, avoid injecting insulin into: ? The arms, when playing tennis. ? The legs, when jogging.  Keep records of your  exercise habits. Doing this can help you and your health care provider adjust your diabetes management plan as needed. Write down: ? Food that you eat before and after you exercise. ? Blood glucose levels before and after you exercise. ? The type and amount of exercise you  have done. ? When your insulin is expected to peak, if you use insulin. Avoid exercising at times when your insulin is peaking.  When you start a new exercise or activity, work with your health care provider to make sure the activity is safe for you, and to adjust your insulin, medicines, or food intake as needed.  Drink plenty of water while you exercise to prevent dehydration or heat stroke. Drink enough fluid to keep your urine clear or pale yellow. This information is not intended to replace advice given to you by your health care provider. Make sure you discuss any questions you have with your health care provider. Document Released: 12/23/2003 Document Revised: 04/21/2016 Document Reviewed: 03/13/2016 Elsevier Interactive Patient Education  2018 [employer].

## 2018-02-13 NOTE — Progress Notes (Signed)
Patient was hydrated with 0.9% normal saline via PIV on referral from day clinic. Rechecked CBG was 269. Tolerated well, vitals stable, discharge instructions given, verbalized understanding. Patient alert, oriented and ambulatory at the time of discharge.

## 2018-02-15 MED FILL — !HUMALOG MIX 75-25 KWIKPEN: (75-25) 100 | 12 days supply | Qty: 12 | Fill #0

## 2018-02-17 NOTE — Progress Notes (Signed)
Subjective:    Patient ID: Jill Shaw, female    DOB: June 08, 1976, 42 y.o.   MRN: [id number]  Jill Shaw, a 42 year old female with a history of multiple comorbidities presents for a 2 week follow up of uncontrolled type 2 diabetes mellitus.  Patient was previously prescribed Lantus at bedtime and Humalog for meal coverage.  Patient had adverse reactions to antidiabetic medication plan.  Restarted Humalog 75/25 mix twice daily.  Patient states that she has been taking medications consistently.  However blood sugars have ranged between 250-400.  Jill Shaw states that she typically feels symptoms of hypoglycemia when blood sugar drops below 200.  Patient's CBG was markedly increased on arrival at 410.  She currently denies headache, blurred vision, chest pain, heart palpitations, paresthesias, polyuria, polydipsia, or polyphagia.    Past Medical History:  Diagnosis Date  . Anemia   . Arthritis    knees, hands  . Asthma   . COPD (chronic obstructive pulmonary disease) (HCC)   . Diabetes mellitus without complication (HCC)    type 2  . GERD (gastroesophageal reflux disease)   . Hypertension   . Neuromuscular disorder (HCC)    neuropathy feet  . Seizures (HCC)    pt states r/t stress and blood sugar - no meds last one 4 months ago, not seen neurologist  . Sickle cell trait (HCC)   . Smoker    Review of Systems  Constitutional: Positive for fatigue.  HENT: Negative.   Respiratory: Negative.   Cardiovascular: Negative.  Negative for palpitations and leg swelling.  Gastrointestinal: Negative.   Endocrine: Negative for polydipsia, polyphagia and polyuria.  Genitourinary: Negative.   Allergic/Immunologic: Negative.   Neurological: Negative.   Hematological: Negative.   Psychiatric/Behavioral: Negative.        Objective:   Physical Exam  Constitutional: She is oriented to person, place, and time. She appears well-developed and well-nourished.  Morbid obesity  Cardiovascular:  Normal rate, regular rhythm and normal heart sounds.  Pulmonary/Chest: Effort normal and breath sounds normal.  Abdominal: Soft. Bowel sounds are normal.  Increased abdominal girth  Neurological: She is alert and oriented to person, place, and time.      Assessment & Plan:  1. Type 2 diabetes mellitus without complication, with long-term current use of insulin (HCC) Jill Shaw's CBG was 410 on arrival.  Administered Humalog 10 units.  Also, patient transition to day infusion center to receive a 500 mL fluid bolus x1.  Also, Humalog 75/25 mix was increased to 50 units twice daily.  Patient to follow-up in 1 week and bring glucometer.  Reviewed glucometer during visit, CBGs are frequently elevated.  Patient may benefit from a referral to diabetes and nutrition.  She says that she typically follows a carbohydrate modify diet, however on reviewing glucometer it appears that patient may not be compliant with diet or medication regimen. - POCT glycosylated hemoglobin (Hb A1C) - POCT urinalysis dipstick - Glucose (CBG) - Insulin Lispro Prot & Lispro (HUMALOG MIX 75/25 KWIKPEN) (75-25) 100 UNIT/ML Kwikpen; Inject 50 Units into the skin 2 (two) times daily.  Dispense: 15 mL; Refill: 11  2. Elevated glucose - NON FORMULARY 10 Units  3. Tobacco dependence Patient is in pre-contemplative state.  Reviewed the dangers of smoking due to microvascular complications of type 2 diabetes mellitus.  Patient expressed understanding.  She refuses interventions to aid in smoking cessation.  4. Morbid obesity (HCC) Recommend a lowfat, low carbohydrate diet divided over 5-6 small meals, increase water intake  to 6-8 glasses, and 150 minutes per week of cardiovascular exercise.  Body mass index is 46 kg/m. Jill Shaw's BMI is consistent with morbid obesity.  She and I have discussed diet at length.  I recommend that patient decrease weight by 10% over the next 3 to 6 months. Also, Jill Shaw may benefit from a referral to diabetes  and nutrition services.  RTC: One week for diabetes follow-up   Nolon Nations  MSN, FNP-C Patient Care [facility] [address] [place], [place] [address] [phone]

## 2018-02-22 ENCOUNTER — Other Ambulatory Visit: Payer: Self-pay | Admitting: Family Medicine

## 2018-02-22 DIAGNOSIS — E119 Type 2 diabetes mellitus without complications: Secondary | ICD-10-CM

## 2018-02-22 DIAGNOSIS — Z794 Long term (current) use of insulin: Principal | ICD-10-CM

## 2018-02-22 MED ORDER — INSULIN LISPRO PROT & LISPRO (75-25 MIX) 100 UNIT/ML KWIKPEN: 60.0000 [IU] | PEN_INJECTOR | Freq: Two times a day (BID) | SUBCUTANEOUS | 11 refills | Status: DC

## 2018-02-22 NOTE — Progress Notes (Signed)
Meds ordered this encounter  Medications  . Insulin Lispro Prot & Lispro (HUMALOG MIX 75/25 KWIKPEN) (75-25) 100 UNIT/ML Kwikpen    Sig: Inject 60 Units into the skin 2 (two) times daily.    Dispense:  15 mL    Refill:  11    Nolon Nations  MSN, FNP-C Patient [facility] [address] [place], [place] [address] [phone]

## 2018-02-26 MED FILL — $HUMALOG MIX 75-25 KWIKPEN: (75-25) 100 | 37 days supply | Qty: 45 | Fill #0

## 2018-03-14 ENCOUNTER — Telehealth: Payer: Self-pay

## 2018-03-14 NOTE — Telephone Encounter (Signed)
Patient is asking if she can go back to her 80 unit dosage. Please advise. Thanks!

## 2018-03-25 ENCOUNTER — Ambulatory Visit (INDEPENDENT_AMBULATORY_CARE_PROVIDER_SITE_OTHER): Payer: Self-pay | Admitting: Family Medicine

## 2018-03-25 VITALS — BP 140/62 | HR 104 | Temp 99.4°F | Resp 16 | Ht 64.0 in | Wt 261.0 lb

## 2018-03-25 DIAGNOSIS — I10 Essential (primary) hypertension: Secondary | ICD-10-CM

## 2018-03-25 DIAGNOSIS — Z794 Long term (current) use of insulin: Secondary | ICD-10-CM

## 2018-03-25 DIAGNOSIS — E119 Type 2 diabetes mellitus without complications: Secondary | ICD-10-CM

## 2018-03-25 DIAGNOSIS — F17200 Nicotine dependence, unspecified, uncomplicated: Secondary | ICD-10-CM

## 2018-03-25 LAB — POCT GLYCOSYLATED HEMOGLOBIN (HGB A1C): HEMOGLOBIN A1C: 8.9 % — AB (ref 4.0–5.6)

## 2018-03-25 LAB — POCT URINALYSIS DIPSTICK
BILIRUBIN UA: NEGATIVE
Glucose, UA: POSITIVE — AB
Ketones, UA: NEGATIVE
Leukocytes, UA: NEGATIVE
Nitrite, UA: NEGATIVE
PH UA: 5.5 (ref 5.0–8.0)
PROTEIN UA: NEGATIVE
Spec Grav, UA: 1.015 (ref 1.010–1.025)
UROBILINOGEN UA: 0.2 U/dL

## 2018-03-25 LAB — GLUCOSE, POCT (MANUAL RESULT ENTRY)
POC GLUCOSE: 356 mg/dL — AB (ref 70–99)
POC Glucose: 379 mg/dl — AB (ref 70–99)

## 2018-03-25 MED ORDER — INSULIN LISPRO 100 UNIT/ML ~~LOC~~ SOLN
20.0000 [IU] | Freq: Once | SUBCUTANEOUS | Status: AC
  Administered 2018-03-25: 20 [IU] via SUBCUTANEOUS

## 2018-03-25 MED ORDER — INSULIN LISPRO PROT & LISPRO (75-25 MIX) 100 UNIT/ML KWIKPEN: PEN_INJECTOR | SUBCUTANEOUS | 11 refills | Status: DC

## 2018-03-25 MED FILL — $HUMALOG MIX 75-25 KWIKPEN: (75-25) 100 | 32 days supply | Qty: 45 | Fill #0

## 2018-03-25 NOTE — Patient Instructions (Addendum)
Your hemoglobin A1c is 8.9, which is consistent with baseline.  You warrant a referral to endocrinology for further work-up and evaluation  Will continue Humalog 70/25 mix 70 units every 12 hours Also, continue Onglyza 2.5 mg daily   Recommend a carbohydrate modify diet divided over small meals throughout the day. Diabetes Mellitus and Nutrition When you have diabetes (diabetes mellitus), it is very important to have healthy eating habits because your blood sugar (glucose) levels are greatly affected by what you eat and drink. Eating healthy foods in the appropriate amounts, at about the same times every day, can help you:  Control your blood glucose.  Lower your risk of heart disease.  Improve your blood pressure.  Reach or maintain a healthy weight.  Every person with diabetes is different, and each person has different needs for a meal plan. Your health care provider may recommend that you work with a diet and nutrition specialist (dietitian) to make a meal plan that is best for you. Your meal plan may vary depending on factors such as:  The calories you need.  The medicines you take.  Your weight.  Your blood glucose, blood pressure, and cholesterol levels.  Your activity level.  Other health conditions you have, such as heart or kidney disease.  How do carbohydrates affect me? Carbohydrates affect your blood glucose level more than any other type of food. Eating carbohydrates naturally increases the amount of glucose in your blood. Carbohydrate counting is a method for keeping track of how many carbohydrates you eat. Counting carbohydrates is important to keep your blood glucose at a healthy level, especially if you use insulin or take certain oral diabetes medicines. It is important to know how many carbohydrates you can safely have in each meal. This is different for every person. Your dietitian can help you calculate how many carbohydrates you should have at each meal and  for snack. Foods that contain carbohydrates include:  Bread, cereal, rice, pasta, and crackers.  Potatoes and corn.  Peas, beans, and lentils.  Milk and yogurt.  Fruit and juice.  Desserts, such as cakes, cookies, ice cream, and candy.  How does alcohol affect me? Alcohol can cause a sudden decrease in blood glucose (hypoglycemia), especially if you use insulin or take certain oral diabetes medicines. Hypoglycemia can be a life-threatening condition. Symptoms of hypoglycemia (sleepiness, dizziness, and confusion) are similar to symptoms of having too much alcohol. If your health care provider says that alcohol is safe for you, follow these guidelines:  Limit alcohol intake to no more than 1 drink per day for nonpregnant women and 2 drinks per day for men. One drink equals 12 oz of beer, 5 oz of wine, or 1 oz of hard liquor.  Do not drink on an empty stomach.  Keep yourself hydrated with water, diet soda, or unsweetened iced tea.  Keep in mind that regular soda, juice, and other mixers may contain a lot of sugar and must be counted as carbohydrates.  What are tips for following this plan? Reading food labels  Start by checking the serving size on the label. The amount of calories, carbohydrates, fats, and other nutrients listed on the label are based on one serving of the food. Many foods contain more than one serving per package.  Check the total grams (g) of carbohydrates in one serving. You can calculate the number of servings of carbohydrates in one serving by dividing the total carbohydrates by 15. For example, if a food has 30  g of total carbohydrates, it would be equal to 2 servings of carbohydrates.  Check the number of grams (g) of saturated and trans fats in one serving. Choose foods that have low or no amount of these fats.  Check the number of milligrams (mg) of sodium in one serving. Most people should limit total sodium intake to less than 2,300 mg per day.  Always  check the nutrition information of foods labeled as "low-fat" or "nonfat". These foods may be higher in added sugar or refined carbohydrates and should be avoided.  Talk to your dietitian to identify your daily goals for nutrients listed on the label. Shopping  Avoid buying canned, premade, or processed foods. These foods tend to be high in fat, sodium, and added sugar.  Shop around the outside edge of the grocery store. This includes fresh fruits and vegetables, bulk grains, fresh meats, and fresh dairy. Cooking  Use low-heat cooking methods, such as baking, instead of high-heat cooking methods like deep frying.  Cook using healthy oils, such as olive, canola, or sunflower oil.  Avoid cooking with butter, cream, or high-fat meats. Meal planning  Eat meals and snacks regularly, preferably at the same times every day. Avoid going long periods of time without eating.  Eat foods high in fiber, such as fresh fruits, vegetables, beans, and whole grains. Talk to your dietitian about how many servings of carbohydrates you can eat at each meal.  Eat 4-6 ounces of lean protein each day, such as lean meat, chicken, fish, eggs, or tofu. 1 ounce is equal to 1 ounce of meat, chicken, or fish, 1 egg, or 1/4 cup of tofu.  Eat some foods each day that contain healthy fats, such as avocado, nuts, seeds, and fish. Lifestyle   Check your blood glucose regularly.  Exercise at least 30 minutes 5 or more days each week, or as told by your health care provider.  Take medicines as told by your health care provider.  Do not use any products that contain nicotine or tobacco, such as cigarettes and e-cigarettes. If you need help quitting, ask your health care provider.  Work with a Veterinary surgeoncounselor or diabetes educator to identify strategies to manage stress and any emotional and social challenges. What are some questions to ask my health care provider?  Do I need to meet with a diabetes educator?  Do I need  to meet with a dietitian?  What number can I call if I have questions?  When are the best times to check my blood glucose? Where to find more information:  American Diabetes Association: diabetes.org/food-and-fitness/food  Academy of Nutrition and Dietetics: https://www.vargas.com/www.eatright.org/resources/health/diseases-and-conditions/diabetes  [employer] Institute of Diabetes and Digestive and Kidney Diseases (NIH): FindJewelers.czwww.niddk.nih.gov/health-information/diabetes/overview/diet-eating-physical-activity Summary  A healthy meal plan will help you control your blood glucose and maintain a healthy lifestyle.  Working with a diet and nutrition specialist (dietitian) can help you make a meal plan that is best for you.  Keep in mind that carbohydrates and alcohol have immediate effects on your blood glucose levels. It is important to count carbohydrates and to use alcohol carefully. This information is not intended to replace advice given to you by your health care provider. Make sure you discuss any questions you have with your health care provider. Document Released: 06/29/2005 Document Revised: 11/06/2016 Document Reviewed: 11/06/2016 Elsevier Interactive Patient Education  2018 [employer]  Diabetes and Foot Care Diabetes may cause you to have problems because of poor blood supply (circulation) to your feet and legs. This  may cause the skin on your feet to become thinner, break easier, and heal more slowly. Your skin may become dry, and the skin may peel and crack. You may also have nerve damage in your legs and feet causing decreased feeling in them. You may not notice minor injuries to your feet that could lead to infections or more serious problems. Taking care of your feet is one of the most important things you can do for yourself. Follow these instructions at home:  Wear shoes at all times, even in the house. Do not go barefoot. Bare feet are easily injured.  Check your feet daily for blisters, cuts, and  redness. If you cannot see the bottom of your feet, use a mirror or ask someone for help.  Wash your feet with warm water (do not use hot water) and mild soap. Then pat your feet and the areas between your toes until they are completely dry. Do not soak your feet as this can dry your skin.  Apply a moisturizing lotion or petroleum jelly (that does not contain alcohol and is unscented) to the skin on your feet and to dry, brittle toenails. Do not apply lotion between your toes.  Trim your toenails straight across. Do not dig under them or around the cuticle. File the edges of your nails with an emery board or nail file.  Do not cut corns or calluses or try to remove them with medicine.  Wear clean socks or stockings every day. Make sure they are not too tight. Do not wear knee-high stockings since they may decrease blood flow to your legs.  Wear shoes that fit properly and have enough cushioning. To break in new shoes, wear them for just a few hours a day. This prevents you from injuring your feet. Always look in your shoes before you put them on to be sure there are no objects inside.  Do not cross your legs. This may decrease the blood flow to your feet.  If you find a minor scrape, cut, or break in the skin on your feet, keep it and the skin around it clean and dry. These areas may be cleansed with mild soap and water. Do not cleanse the area with peroxide, alcohol, or iodine.  When you remove an adhesive bandage, be sure not to damage the skin around it.  If you have a wound, look at it several times a day to make sure it is healing.  Do not use heating pads or hot water bottles. They may burn your skin. If you have lost feeling in your feet or legs, you may not know it is happening until it is too late.  Make sure your health care provider performs a complete foot exam at least annually or more often if you have foot problems. Report any cuts, sores, or bruises to your health care  provider immediately. Contact a health care provider if:  You have an injury that is not healing.  You have cuts or breaks in the skin.  You have an ingrown nail.  You notice redness on your legs or feet.  You feel burning or tingling in your legs or feet.  You have pain or cramps in your legs and feet.  Your legs or feet are numb.  Your feet always feel cold. Get help right away if:  There is increasing redness, swelling, or pain in or around a wound.  There is a red line that goes up your leg.  Pus  is coming from a wound.  You develop a fever or as directed by your health care provider.  You notice a bad smell coming from an ulcer or wound. This information is not intended to replace advice given to you by your health care provider. Make sure you discuss any questions you have with your health care provider. Document Released: 09/29/2000 Document Revised: 03/09/2016 Document Reviewed: 03/11/2013 Elsevier Interactive Patient Education  2017 [employer].

## 2018-03-26 ENCOUNTER — Telehealth: Payer: Self-pay

## 2018-03-26 LAB — BASIC METABOLIC PANEL
BUN / CREAT RATIO: 10 (ref 9–23)
BUN: 10 mg/dL (ref 6–24)
CO2: 21 mmol/L (ref 20–29)
CREATININE: 1.05 mg/dL — AB (ref 0.57–1.00)
Calcium: 9.4 mg/dL (ref 8.7–10.2)
Chloride: 102 mmol/L (ref 96–106)
GFR, EST AFRICAN AMERICAN: 76 mL/min/{1.73_m2} (ref >59)
GFR, EST NON AFRICAN AMERICAN: 66 mL/min/{1.73_m2} (ref >59)
GLUCOSE: 315 mg/dL — AB (ref 65–99)
Potassium: 4.3 mmol/L (ref 3.5–5.2)
SODIUM: 140 mmol/L (ref 134–144)

## 2018-03-26 NOTE — Telephone Encounter (Signed)
Called, no answer. Left a message advising that creatine level is elevated and to take antihypertensive and antidiabetic medications consistently. Advised that she should eat a low fat/ low carb diet over 5 to 6 small meals daily. Advised that referral for endocrinology has been sent and their office will will to scheduled an appointment. Asked if any questions to call back to our office. Thanks!

## 2018-03-26 NOTE — Telephone Encounter (Signed)
-----   Message from Massie MaroonLachina M Hollis, [place] sent at 03/26/2018  9:50 AM EDT ----- Regarding: lab results Please inform patient that creatinine level (an indicator of kidney functioning) is mildly. Advise patient to take all prescribed antihypertensive and antidiabetic medications consistently. Also, recommend a low fat, low carbohydrate diet divided over small meals.  Please ensure that patient has a first available appointment scheduled with endocrinology.   Thank you,   Nolon NationsLachina Moore Hollis  MSN, FNP-C Patient Care [facility] [address]  [place], [place] [id number] [phone]

## 2018-03-27 ENCOUNTER — Encounter: Payer: Self-pay | Admitting: Family Medicine

## 2018-03-27 NOTE — Progress Notes (Signed)
Subjective:    Patient ID: Jill SauersJulie Mae Shaw, female    DOB: 11-02-1975, 42 y.o.   MRN: [id number]  Jill Shaw, a 42 year old female with a history of multiple comorbidities presents for a  follow up of uncontrolled type 2 diabetes mellitus.  Patient was previously prescribed Lantus at bedtime and Humalog for meal coverage.  Patient had adverse reactions to antidiabetic medication plan.  Restarted Humalog 75/25 mix twice daily.  Patient states that she has been taking medications consistently.  However blood sugars have ranged between 250-400.  Jill Shaw states that she typically feels symptoms of hypoglycemia when blood sugar drops below 200.  Patient's CBG was markedly increased on arrival at 379.  She currently denies headache, blurred vision, chest pain, heart palpitations, paresthesias, polyuria, polydipsia, or polyphagia.  Diabetes  Pertinent negatives for hypoglycemia include no headaches, hunger, pallor, seizures or sweats. Associated symptoms include fatigue. Pertinent negatives for diabetes include no polydipsia, no polyphagia and no polyuria. Symptoms are worsening. Risk factors for coronary artery disease include diabetes mellitus, obesity, sedentary lifestyle and tobacco exposure. Current diabetic treatment includes intensive insulin program. She is compliant with treatment most of the time. She is following a generally unhealthy diet. An ACE inhibitor/angiotensin II receptor blocker is being taken. She does not see a podiatrist.Eye exam is not current.     Past Medical History:  Diagnosis Date  . Anemia   . Arthritis    knees, hands  . Asthma   . COPD (chronic obstructive pulmonary disease) (HCC)   . Diabetes mellitus without complication (HCC)    type 2  . GERD (gastroesophageal reflux disease)   . Hypertension   . Neuromuscular disorder (HCC)    neuropathy feet  . Seizures (HCC)    pt states r/t stress and blood sugar - no meds last one 4 months ago, not seen neurologist  .  Sickle cell trait (HCC)   . Smoker    Review of Systems  Constitutional: Positive for fatigue.  HENT: Negative.   Respiratory: Negative.   Cardiovascular: Negative.  Negative for palpitations and leg swelling.  Gastrointestinal: Negative.   Endocrine: Negative for polydipsia, polyphagia and polyuria.  Genitourinary: Negative.   Skin: Negative for pallor.  Allergic/Immunologic: Negative.   Neurological: Negative.  Negative for seizures and headaches.  Hematological: Negative.   Psychiatric/Behavioral: Negative.        Objective:   Physical Exam  Constitutional: She is oriented to person, place, and time. She appears well-developed and well-nourished.  Morbid obesity  Cardiovascular: Normal rate, regular rhythm and normal heart sounds.  Pulmonary/Chest: Effort normal and breath sounds normal.  Abdominal: Soft. Bowel sounds are normal.  Increased abdominal girth  Neurological: She is alert and oriented to person, place, and time.      Assessment & Plan:  1. Type 2 diabetes mellitus without complication, with long-term current use of insulin (HCC) Wynetta's CBG was 379 on arrival.  Administered Humalog 20 units.  A Also, Humalog 75/25 mix was increased to 70units twice daily.   Reviewed glucometer during visit, CBGs are frequently elevated.  Patient may benefit from a referral to diabetes and nutrition.  She says that she typically follows a lowfat, carb modified diet, however on reviewing glucometer it appears that patient may not be compliant with diet or medication regimen. Will send a referral to endocrinology for consult Discussed diet and medication regimen at length  - HgB A1c - Urinalysis Dipstick - Glucose (CBG) - insulin lispro (HUMALOG) injection 20  Units - Insulin Lispro Prot & Lispro (HUMALOG MIX 75/25 KWIKPEN) (75-25) 100 UNIT/ML Kwikpen; Inject 70 units every 12 hours  Dispense: 15 mL; Refill: 11 - Basic Metabolic Panel - Glucose (CBG) - Ambulatory referral to  Endocrinology  2. Essential hypertension Blood pressure is at goal on current medication regimen No medication changes warranted at this time We have discussed target BP range and blood pressure goal. I have advised patient to check BP regularly and to call us back or report to clinic if the numbers are consistently higher than 140/90. We discussed the importance of compliance with medical therapy and DASH diet recommended, consequences of uncontrolled hypertension discussed.  - continue current BP medications  - Basic Metabolic Panel  3. Tobacco dependence Smoking cessation instruction/counseling given:  counseled patient on the dangers of tobacco use, advised patient to stop smoking, and reviewed strategies to maximize success    RTC: As previously scheduled   The patient was given clear instructions to go to ER or return to medical center if symptoms do not improve, worsen or new problems develop. The patient verbalized understanding.    Nolon Nations  MSN, FNP-C [facility] [address] [place], [place] [address] [phone]

## 2018-04-11 ENCOUNTER — Ambulatory Visit: Payer: No Typology Code available for payment source | Admitting: Family Medicine

## 2018-04-22 MED FILL — $HUMALOG MIX 75-25 KWIKPEN: (75-25) 100 | 32 days supply | Qty: 45 | Fill #1

## 2018-05-13 ENCOUNTER — Telehealth: Payer: Self-pay | Admitting: Family Medicine

## 2018-05-13 NOTE — Telephone Encounter (Signed)
Do you know how much she has been using per day? She needs to come in to be seen.

## 2018-05-13 NOTE — Telephone Encounter (Signed)
PT request increase insulin until she can get in to see specialist we referred her to.  300-400 blood sugars morning and night for 2 weeks. Has had to use more insulin. [facility]

## 2018-05-13 NOTE — Telephone Encounter (Signed)
Andre, Please advise. Thanks!  

## 2018-05-14 ENCOUNTER — Other Ambulatory Visit: Payer: Self-pay

## 2018-05-14 ENCOUNTER — Telehealth: Payer: Self-pay | Admitting: Family Medicine

## 2018-05-14 DIAGNOSIS — E119 Type 2 diabetes mellitus without complications: Secondary | ICD-10-CM

## 2018-05-14 DIAGNOSIS — Z794 Long term (current) use of insulin: Principal | ICD-10-CM

## 2018-05-14 MED ORDER — INSULIN LISPRO PROT & LISPRO (75-25 MIX) 100 UNIT/ML KWIKPEN: PEN_INJECTOR | SUBCUTANEOUS | 11 refills | Status: DC

## 2018-05-14 MED FILL — $HUMALOG MIX 75-25 KWIKPEN: (75-25) 100 | 32 days supply | Qty: 45 | Fill #2

## 2018-05-14 NOTE — Telephone Encounter (Signed)
Called and left message advising patient that she will need to be seen for this and left voicemail to call and schedule an appointment. Thanks!

## 2018-05-14 NOTE — Telephone Encounter (Signed)
Refill for insulin has been sent to pharmacy. Thanks!

## 2018-05-14 NOTE — Telephone Encounter (Signed)
[facility] uses [facility]. Pt request incrase insulin from 70 am and 70 pm. To an increased dosage for ins to pay.

## 2018-05-15 ENCOUNTER — Ambulatory Visit: Payer: No Typology Code available for payment source | Admitting: Family Medicine

## 2018-05-16 ENCOUNTER — Ambulatory Visit: Payer: No Typology Code available for payment source | Admitting: Family Medicine

## 2018-05-30 ENCOUNTER — Ambulatory Visit: Payer: No Typology Code available for payment source | Admitting: Endocrinology

## 2018-06-03 ENCOUNTER — Ambulatory Visit: Payer: No Typology Code available for payment source | Admitting: Family Medicine

## 2018-06-04 ENCOUNTER — Ambulatory Visit (INDEPENDENT_AMBULATORY_CARE_PROVIDER_SITE_OTHER): Payer: Self-pay | Admitting: Family Medicine

## 2018-06-04 VITALS — BP 124/78 | HR 84 | Temp 98.0°F | Ht 64.0 in | Wt 263.0 lb

## 2018-06-04 DIAGNOSIS — I10 Essential (primary) hypertension: Secondary | ICD-10-CM

## 2018-06-04 DIAGNOSIS — R8290 Unspecified abnormal findings in urine: Secondary | ICD-10-CM

## 2018-06-04 DIAGNOSIS — R319 Hematuria, unspecified: Secondary | ICD-10-CM

## 2018-06-04 DIAGNOSIS — N390 Urinary tract infection, site not specified: Secondary | ICD-10-CM

## 2018-06-04 DIAGNOSIS — Z09 Encounter for follow-up examination after completed treatment for conditions other than malignant neoplasm: Secondary | ICD-10-CM

## 2018-06-04 DIAGNOSIS — Z794 Long term (current) use of insulin: Secondary | ICD-10-CM

## 2018-06-04 DIAGNOSIS — E119 Type 2 diabetes mellitus without complications: Secondary | ICD-10-CM

## 2018-06-04 DIAGNOSIS — R7309 Other abnormal glucose: Secondary | ICD-10-CM

## 2018-06-04 LAB — POCT GLYCOSYLATED HEMOGLOBIN (HGB A1C): Hemoglobin A1C: 7.3 % — AB (ref 4.0–5.6)

## 2018-06-04 LAB — GLUCOSE, POCT (MANUAL RESULT ENTRY)
POC Glucose: 342 mg/dl — AB (ref 70–99)
POC Glucose: 394 mg/dl — AB (ref 70–99)

## 2018-06-04 LAB — POCT URINALYSIS DIP (MANUAL ENTRY)
Bilirubin, UA: NEGATIVE
Glucose, UA: 500 mg/dL — AB
Ketones, POC UA: NEGATIVE mg/dL
Nitrite, UA: NEGATIVE
Protein Ur, POC: NEGATIVE mg/dL
Spec Grav, UA: 1.01 (ref 1.010–1.025)
Urobilinogen, UA: 1 E.U./dL
pH, UA: 6 (ref 5.0–8.0)

## 2018-06-04 MED ORDER — INSULIN LISPRO 100 UNIT/ML ~~LOC~~ SOLN
10.0000 [IU] | Freq: Once | SUBCUTANEOUS | Status: AC
  Administered 2018-06-04: 10 [IU] via SUBCUTANEOUS

## 2018-06-04 MED ORDER — INSULIN LISPRO PROT & LISPRO (75-25 MIX) 100 UNIT/ML KWIKPEN: PEN_INJECTOR | SUBCUTANEOUS | 11 refills | Status: DC

## 2018-06-04 MED ORDER — NITROFURANTOIN MONOHYD MACRO 100 MG PO CAPS: 100.0000 mg | ORAL_CAPSULE | Freq: Two times a day (BID) | ORAL | 0 refills | Status: AC

## 2018-06-04 MED FILL — NITROFURANTOIN MONO-MCR 100: 100 | 7 days supply | Qty: 14 | Fill #0

## 2018-06-04 MED FILL — $HUMALOG MIX 75-25 KWIKPEN: (75-25) 100 | 8 days supply | Qty: 12 | Fill #0

## 2018-06-04 NOTE — Progress Notes (Signed)
Follow Up  Subjective:    Patient ID: Jill Shaw, female    DOB: [birth year], 42 y.o.   MRN: [id number]   Current Status: Since her last office visit, she is doing well with no complaints. She denies fevers, chills, fatigue, recent infections, weight loss, and night sweats. She has not had any headaches, visual changes, dizziness, and falls. No chest pain, heart palpitations, cough and shortness of breath reported. No reports of GI problems such as nausea, vomiting, diarrhea, and constipation. She has no reports of blood in stools, dysuria and hematuria. No depression or anxiety, and denies suicidal ideations, homicidal ideations, or auditory hallucinations. She denies pain today.   Past Medical History:  Diagnosis Date  . Anemia   . Arthritis    knees, hands  . Asthma   . COPD (chronic obstructive pulmonary disease) ([place])   . Diabetes mellitus without complication ([place])    type 2  . GERD (gastroesophageal reflux disease)   . Hypertension   . Neuromuscular disorder (HCC)    neuropathy feet  . Seizures ([place])    pt states r/t stress and blood sugar - no meds last one 4 months ago, not seen neurologist  . Sickle cell trait ([place])   . Smoker     Family History  Problem Relation Age of Onset  . Diabetes Mother   . Hypertension Mother     Social History   Socioeconomic History  . Marital status: Single    Spouse name: Not on file  . Number of children: Not on file  . Years of education: Not on file  . Highest education level: Not on file  Occupational History  . Not on file  Social Needs  . Financial resource strain: Not on file  . Food insecurity:    Worry: Not on file    Inability: Not on file  . Transportation needs:    Medical: Not on file    Non-medical: Not on file  Tobacco Use  . Smoking status: Current Every Day Smoker    Packs/day: 0.25    Years: 15.00    Pack years: 3.75    Types: Cigarettes  . Smokeless tobacco: Never Used  Substance and Sexual  Activity  . Alcohol use: No  . Drug use: No  . Sexual activity: Not Currently    Birth control/protection: None  Lifestyle  . Physical activity:    Days per week: Not on file    Minutes per session: Not on file  . Stress: Not on file  Relationships  . Social connections:    Talks on phone: Not on file    Gets together: Not on file    Attends religious service: Not on file    Active member of club or organization: Not on file    Attends meetings of clubs or organizations: Not on file    Relationship status: Not on file  . Intimate partner violence:    Fear of current or ex partner: Not on file    Emotionally abused: Not on file    Physically abused: Not on file    Forced sexual activity: Not on file  Other Topics Concern  . Not on file  Social History Narrative  . Not on file    Past Surgical History:  Procedure Laterality Date  . CESAREAN SECTION     x 1  . EYE SURGERY Bilateral    laser right and cataract removed left eye  . UPPER GI ENDOSCOPY  07/2017  Immunization History  Administered Date(s) Administered  . Pneumococcal Polysaccharide-23 03/28/2015  . Tdap 03/27/2016   Current Meds  Medication Sig  . albuterol (PROVENTIL HFA;VENTOLIN HFA) 108 (90 Base) MCG/ACT inhaler Inhale 2 puffs into the lungs every 6 (six) hours as needed for wheezing or shortness of breath.  . Blood Glucose Monitoring Suppl (TRUE METRIX METER) w/Device KIT 1 each by Does not apply route 4 (four) times daily -  before meals and at bedtime.  . budesonide-formoterol (SYMBICORT) 160-4.5 MCG/ACT inhaler Inhale 2 puffs into the lungs 2 (two) times daily.  . cholecalciferol (VITAMIN D) 1000 units tablet Take 1,000 Units by mouth daily.  . Ferrous Sulfate (IRON) 325 (65 Fe) MG TABS Take 1 tablet (325 mg total) by mouth daily.  . fluticasone (FLONASE) 50 MCG/ACT nasal spray Place 2 sprays into both nostrils daily.  Marland Kitchen glucose blood (TRUE METRIX BLOOD GLUCOSE TEST) test strip Use as instructed  .  hydrochlorothiazide (HYDRODIURIL) 25 MG tablet TAKE 1 TABLETS(25 MG) BY MOUTH DAILY  . HYDROcodone-acetaminophen (NORCO) 7.5-325 MG tablet Take 1 tablet by mouth every 6 (six) hours as needed for moderate pain.  . Insulin Lispro Prot & Lispro (HUMALOG MIX 75/25 KWIKPEN) (75-25) 100 UNIT/ML Kwikpen Inject 85 units every 12 hours  . losartan (COZAAR) 50 MG tablet Take 1 tablet (50 mg total) by mouth daily.  . metoprolol succinate (TOPROL-XL) 25 MG 24 hr tablet Take 0.5 tablets (12.5 mg total) by mouth daily.  Marland Kitchen omeprazole (PRILOSEC) 40 MG capsule Take 1 capsule (40 mg total) by mouth daily.  . potassium chloride (K-DUR) 10 MEQ tablet Take 1 tablet (10 mEq total) by mouth daily.  . pregabalin (LYRICA) 75 MG capsule Take 1 capsule (75 mg total) by mouth 2 (two) times daily.  . saxagliptin HCl (ONGLYZA) 2.5 MG TABS tablet Take 1 tablet (2.5 mg total) by mouth daily.  . tizanidine (ZANAFLEX) 6 MG capsule TAKE 1 CAPSULE(6 MG) BY MOUTH THREE TIMES DAILY  . Vitamin D, Ergocalciferol, (DRISDOL) 50000 units CAPS capsule Take 1 capsule (50,000 Units total) by mouth every 7 (seven) days.  . [DISCONTINUED] Insulin Lispro Prot & Lispro (HUMALOG MIX 75/25 KWIKPEN) (75-25) 100 UNIT/ML Kwikpen Inject 70 units every 12 hours   Current Facility-Administered Medications for the 06/04/18 encounter (Office Visit) with Azzie Glatter, FNP  Medication  . betamethasone acetate-betamethasone sodium phosphate (CELESTONE) injection 3 mg  . betamethasone acetate-betamethasone sodium phosphate (CELESTONE) injection 3 mg   Allergies  Allergen Reactions  . Ketoprofen Nausea And Vomiting  . Aspirin Nausea Only  . Cortizone-10 [Hydrocortisone] Nausea Only  . Gabapentin Nausea And Vomiting    upset stomach  . Ibuprofen Nausea Only and Nausea And Vomiting  . Liraglutide Nausea And Vomiting  . Naproxen Nausea And Vomiting  . Omeprazole-Sodium Bicarbonate Nausea And Vomiting  . Other Nausea And Vomiting  . Sulfa  Antibiotics Nausea And Vomiting  . Tramadol Nausea And Vomiting    stomach upset   Ht '5\' 4"'$  (1.626 m)   Wt 263 lb (119.3 kg)   LMP 05/28/2018   BMI 45.14 kg/m  . Review of Systems  Constitutional: Negative.   HENT: Negative.   Eyes: Negative.   Respiratory: Negative.   Cardiovascular: Negative.   Gastrointestinal: Positive for abdominal distention (Obese).  Endocrine: Negative.   Genitourinary: Negative.   Musculoskeletal: Positive for arthralgias (generalized).  Skin: Negative.   Neurological: Positive for headaches (occasional).  Hematological: Negative.   Psychiatric/Behavioral: Negative.    Objective:   Physical Exam  Cardiovascular: Normal rate, regular rhythm, normal heart sounds and intact distal pulses.  Pulmonary/Chest: Effort normal and breath sounds normal.  Abdominal: Soft. Bowel sounds are normal. She exhibits distension (Obese).  Skin: Skin is warm and dry.  Psychiatric: She has a normal mood and affect. Her behavior is normal. Judgment and thought content normal.   Assessment & Plan:   1. Type 2 diabetes mellitus without complication, with long-term current use of insulin (HCC) - POCT glycosylated hemoglobin (Hb A1C) - POCT urinalysis dipstick  2. Essential hypertension  3. Elevated glucose Glucose level is 342 today. diabetes nutritional education is reviewed. She will continue to decrease foods/beverages high in sugars and carbs and follow Heart Healthy or DASH diet. Increase physical activity to at least 30 minutes cardio exercise daily.   - POCT glucose (manual entry) - insulin lispro (HUMALOG) injection 10 Units - POCT glucose (manual entry) - insulin lispro (HUMALOG) injection 10 Units  4. Morbid obesity (HCC) BMI is 45.14 today.  Her goal BMI  is <25. Encouraged efforts to reduce weight include engaging in physical activity as tolerated with goal of 150 minutes per week. Improve dietary choices and eat a meal regimen consistent with a  Mediterranean or DASH diet. Reduce simple arbohydrates. Do not skip meals and eat chealthy snacks throughout the day to avoid over-eating at dinner. Set a goal weight loss that is achievable for you.  5. Urinary tract infection with hematuria, site unspecified - nitrofurantoin, macrocrystal-monohydrate, (MACROBID) 100 MG capsule; Take 1 capsule (100 mg total) by mouth 2 (two) times daily for 7 days.  Dispense: 14 capsule; Refill: 0  6. Abnormal urinalysis - Urine Culture  7. Follow up Follow up in 1 month.  Meds ordered this encounter  Medications  . insulin lispro (HUMALOG) injection 10 Units  . insulin lispro (HUMALOG) injection 10 Units  . nitrofurantoin, macrocrystal-monohydrate, (MACROBID) 100 MG capsule    Sig: Take 1 capsule (100 mg total) by mouth 2 (two) times daily for 7 days.    Dispense:  14 capsule    Refill:  0  . Insulin Lispro Prot & Lispro (HUMALOG MIX 75/25 KWIKPEN) (75-25) 100 UNIT/ML Kwikpen    Sig: Inject 85 units every 12 hours    Dispense:  15 mL    Refill:  [place],  MSN, [facility] [place] [address] [place], [place] [address] [phone]

## 2018-06-05 ENCOUNTER — Telehealth: Payer: Self-pay

## 2018-06-05 NOTE — Telephone Encounter (Signed)
Spoke with pharmacy and they have scripts

## 2018-06-06 ENCOUNTER — Ambulatory Visit: Payer: No Typology Code available for payment source | Admitting: Family Medicine

## 2018-06-07 LAB — URINE CULTURE

## 2018-06-10 MED FILL — $HUMALOG MIX 75-25 KWIKPEN: (75-25) 100 | 16 days supply | Qty: 30 | Fill #0

## 2018-06-13 ENCOUNTER — Other Ambulatory Visit: Payer: Self-pay | Admitting: Family Medicine

## 2018-06-14 ENCOUNTER — Ambulatory Visit: Payer: No Typology Code available for payment source

## 2018-06-24 MED FILL — $HUMALOG MIX 75-25 KWIKPEN: (75-25) 100 | 35 days supply | Qty: 60 | Fill #1

## 2018-07-05 ENCOUNTER — Ambulatory Visit: Payer: No Typology Code available for payment source | Admitting: Family Medicine

## 2018-07-16 ENCOUNTER — Encounter: Payer: Self-pay | Admitting: Endocrinology

## 2018-07-16 NOTE — Progress Notes (Deleted)
Patient ID: Jill Shaw, female   DOB: 04-Aug-1976, 42 y.o.   MRN: [id number]          Reason for Appointment: Consultation for Type 2 Diabetes  Referring physician:   History of Present Illness:          Date of diagnosis of type 2 diabetes mellitus:        Background history:    Recent history:   Most recent A1c is done on  INSULIN regimen is:       Non-insulin hypoglycemic drugs the patient is taking are:  Current management, blood sugar patterns and problems identified:            Side effects from medications have been:      Meal times are:  Breakfast is at Lunch: Dinner:    Typical meal intake: Breakfast is                Exercise:    Glucose monitoring:  done  times a day         Glucometer: One Touch .       Blood Glucose readings by time of day and averages from meter download:  PREMEAL Breakfast Lunch Dinner Bedtime  Overall   Glucose range:       Median:        POST-MEAL PC Breakfast PC Lunch PC Dinner  Glucose range:     Median:       Dietician visit, most recent:  Weight history:  Wt Readings from Last 3 Encounters:  06/04/18 263 lb (119.3 kg)  03/25/18 261 lb (118.4 kg)  02/13/18 268 lb (121.6 kg)    Glycemic control:   Lab Results  Component Value Date   HGBA1C 7.3 (A) 06/04/2018   HGBA1C 8.9 (A) 03/25/2018   HGBA1C 8.6 02/13/2018   Lab Results  Component Value Date   LDLCALC 52 03/09/2017   CREATININE 1.05 (H) 03/25/2018   No results found for: MICRALBCREAT  No results found for: FRUCTOSAMINE  No visits with results within 1 Week(s) from this visit.  Latest known visit with results is:  Office Visit on 06/04/2018  Component Date Value Ref Range Status  . Hemoglobin A1C 06/04/2018 7.3* 4.0 - 5.6 % Final  . Color, UA 06/04/2018 yellow  yellow Final  . Clarity, UA 06/04/2018 cloudy* clear Final  . Glucose, UA 06/04/2018 =500* negative mg/dL Final  . Bilirubin, UA 06/04/2018 negative  negative Final  . Ketones, POC  UA 06/04/2018 negative  negative mg/dL Final  . Spec Grav, UA 06/04/2018 1.010  1.010 - 1.025 Final  . Blood, UA 06/04/2018 trace-intact* negative Final  . pH, UA 06/04/2018 6.0  5.0 - 8.0 Final  . Protein Ur, POC 06/04/2018 negative  negative mg/dL Final  . Urobilinogen, UA 06/04/2018 1.0  0.2 or 1.0 E.U./dL Final  . Nitrite, UA 06/04/2018 Negative  Negative Final  . Leukocytes, UA 06/04/2018 Trace* Negative Final  . POC Glucose 06/04/2018 394* 70 - 99 mg/dl Final  . POC Glucose 06/04/2018 342* 70 - 99 mg/dl Final  . Urine Culture, Routine 06/04/2018 Final report*  Final  . Organism ID, Bacteria 06/04/2018 Escherichia coli*  Final   Greater than 100,000 colony forming units per mL  . Antimicrobial Susceptibility 06/04/2018 Comment   Final   Comment:       ** S = Susceptible; I = Intermediate; R = Resistant **  P = Positive; N = Negative             MICS are expressed in micrograms per mL    Antibiotic                 RSLT#1    RSLT#2    RSLT#3    RSLT#4 Amoxicillin/Clavulanic Acid    S Ampicillin                     R Cefazolin                      R Cefepime                       R Ceftriaxone                    R Cefuroxime                     R Ciprofloxacin                  R Ertapenem                      S Gentamicin                     S Imipenem                       S Levofloxacin                   R Meropenem                      S Nitrofurantoin                 S Piperacillin/Tazobactam        S Tetracycline                   R Tobramycin                     S Trimethoprim/Sulfa             S     Allergies as of 07/17/2018      Reactions   Ketoprofen Nausea And Vomiting   Aspirin Nausea Only   Cortizone-10 [hydrocortisone] Nausea Only   Gabapentin Nausea And Vomiting   upset stomach   Ibuprofen Nausea Only, Nausea And Vomiting   Liraglutide Nausea And Vomiting   Naproxen Nausea And Vomiting   Omeprazole-sodium Bicarbonate Nausea And  Vomiting   Other Nausea And Vomiting   Sulfa Antibiotics Nausea And Vomiting   Tramadol Nausea And Vomiting   stomach upset      Medication List        Accurate as of 07/16/18  2:54 PM. Always use your most recent med list.          albuterol 108 (90 Base) MCG/ACT inhaler Commonly known as:  PROVENTIL HFA;VENTOLIN HFA Inhale 2 puffs into the lungs every 6 (six) hours as needed for wheezing or shortness of breath.   budesonide-formoterol 160-4.5 MCG/ACT inhaler Commonly known as:  SYMBICORT Inhale 2 puffs into the lungs 2 (two) times daily.   cholecalciferol 1000 units tablet Commonly known as:  VITAMIN D Take 1,000 Units by mouth daily.   fluticasone 50 MCG/ACT nasal spray Commonly known as:  FLONASE Place 2 sprays into both nostrils daily.   glucose blood test strip Use as instructed   hydrochlorothiazide 25 MG tablet Commonly known as:  HYDRODIURIL TAKE 1 TABLETS(25 MG) BY MOUTH DAILY   HYDROcodone-acetaminophen 7.5-325 MG tablet Commonly known as:  NORCO Take 1 tablet by mouth every 6 (six) hours as needed for moderate pain.   Insulin Lispro Prot & Lispro (75-25) 100 UNIT/ML Kwikpen Commonly known as:  HUMALOG 75/25 MIX Inject 85 units every 12 hours   Iron 325 (65 Fe) MG Tabs Take 1 tablet (325 mg total) by mouth daily.   losartan 50 MG tablet Commonly known as:  COZAAR Take 1 tablet (50 mg total) by mouth daily.   metoprolol succinate 25 MG 24 hr tablet Commonly known as:  TOPROL-XL Take 0.5 tablets (12.5 mg total) by mouth daily.   omeprazole 40 MG capsule Commonly known as:  PRILOSEC Take 1 capsule (40 mg total) by mouth daily.   potassium chloride 10 MEQ tablet Commonly known as:  K-DUR Take 1 tablet (10 mEq total) by mouth daily.   pregabalin 75 MG capsule Commonly known as:  LYRICA Take 1 capsule (75 mg total) by mouth 2 (two) times daily.   saxagliptin HCl 2.5 MG Tabs tablet Commonly known as:  ONGLYZA Take 1 tablet (2.5 mg total) by  mouth daily.   tizanidine 6 MG capsule Commonly known as:  ZANAFLEX TAKE 1 CAPSULE(6 MG) BY MOUTH THREE TIMES DAILY   tizanidine 6 MG capsule Commonly known as:  ZANAFLEX TAKE 1 CAPSULE(6 MG) BY MOUTH THREE TIMES DAILY   TRUE METRIX METER w/Device Kit 1 each by Does not apply route 4 (four) times daily -  before meals and at bedtime.   Vitamin D (Ergocalciferol) 50000 units Caps capsule Commonly known as:  DRISDOL Take 1 capsule (50,000 Units total) by mouth every 7 (seven) days.       Allergies:  Allergies  Allergen Reactions  . Ketoprofen Nausea And Vomiting  . Aspirin Nausea Only  . Cortizone-10 [Hydrocortisone] Nausea Only  . Gabapentin Nausea And Vomiting    upset stomach  . Ibuprofen Nausea Only and Nausea And Vomiting  . Liraglutide Nausea And Vomiting  . Naproxen Nausea And Vomiting  . Omeprazole-Sodium Bicarbonate Nausea And Vomiting  . Other Nausea And Vomiting  . Sulfa Antibiotics Nausea And Vomiting  . Tramadol Nausea And Vomiting    stomach upset    Past Medical History:  Diagnosis Date  . Anemia   . Arthritis    knees, hands  . Asthma   . COPD (chronic obstructive pulmonary disease) ([place])   . Diabetes mellitus without complication ([facility])    type 2  . GERD (gastroesophageal reflux disease)   . Hypertension   . Neuromuscular disorder (H[facility])    neuropathy feet  . Seizures ([place])    pt states r/t stress and blood sugar - no meds last one 4 months ago, not seen neurologist  . Sickle cell trait ([place] [place])   . Smoker     Past Surgical History:  Procedure Laterality Date  . CESAREAN SECTION     x 1  . EYE SURGERY Bilateral    laser right and cataract removed left eye  . UPPER GI ENDOSCOPY  07/2017    Family History  Problem Relation Age of Onset  . Diabetes Mother   . Hypertension Mother     Social History:  reports that she has been smoking cigarettes. She has a 3.75 pack-year smoking history.  She has never used smokeless tobacco. She reports  that she does not drink alcohol or use drugs.   Review of Systems   Lipid history:     Lab Results  Component Value Date   CHOL 136 03/09/2017   HDL 55 03/09/2017   LDLCALC 52 03/09/2017   TRIG 144 03/09/2017   CHOLHDL 2.5 03/09/2017           Hypertension: Has been present  BP Readings from Last 3 Encounters:  06/04/18 124/78  03/25/18 140/62  02/13/18 (!) 153/79    Most recent eye exam was in  Most recent foot exam:  Currently known complications of diabetes:  LABS:  No visits with results within 1 Week(s) from this visit.  Latest known visit with results is:  Office Visit on 06/04/2018  Component Date Value Ref Range Status  . Hemoglobin A1C 06/04/2018 7.3* 4.0 - 5.6 % Final  . Color, UA 06/04/2018 yellow  yellow Final  . Clarity, UA 06/04/2018 cloudy* clear Final  . Glucose, UA 06/04/2018 =500* negative mg/dL Final  . Bilirubin, UA 06/04/2018 negative  negative Final  . Ketones, POC UA 06/04/2018 negative  negative mg/dL Final  . Spec Grav, UA 06/04/2018 1.010  1.010 - 1.025 Final  . Blood, UA 06/04/2018 trace-intact* negative Final  . pH, UA 06/04/2018 6.0  5.0 - 8.0 Final  . Protein Ur, POC 06/04/2018 negative  negative mg/dL Final  . Urobilinogen, UA 06/04/2018 1.0  0.2 or 1.0 E.U./dL Final  . Nitrite, UA 06/04/2018 Negative  Negative Final  . Leukocytes, UA 06/04/2018 Trace* Negative Final  . POC Glucose 06/04/2018 394* 70 - 99 mg/dl Final  . POC Glucose 06/04/2018 342* 70 - 99 mg/dl Final  . Urine Culture, Routine 06/04/2018 Final report*  Final  . Organism ID, Bacteria 06/04/2018 Escherichia coli*  Final   Greater than 100,000 colony forming units per mL  . Antimicrobial Susceptibility 06/04/2018 Comment   Final   Comment:       ** S = Susceptible; I = Intermediate; R = Resistant **                    P = Positive; N = Negative             MICS are expressed in micrograms per mL    Antibiotic                 RSLT#1    RSLT#2    RSLT#3     RSLT#4 Amoxicillin/Clavulanic Acid    S Ampicillin                     R Cefazolin                      R Cefepime                       R Ceftriaxone                    R Cefuroxime                     R Ciprofloxacin                  R Ertapenem                      S Gentamicin  S Imipenem                       S Levofloxacin                   R Meropenem                      S Nitrofurantoin                 S Piperacillin/Tazobactam        S Tetracycline                   R Tobramycin                     S Trimethoprim/Sulfa             S     Physical Examination:  There were no vitals taken for this visit.  GENERAL:         Patient has generalized obesity.    HEENT:         Eye exam shows normal external appearance.  Fundus exam shows no retinopathy.  Oral exam shows normal mucosa .   NECK:   There is no lymphadenopathy  Thyroid is not enlarged and no nodules felt.   Carotids are normal to palpation and no bruit heard  LUNGS:         Chest is symmetrical. Lungs are clear to auscultation.Marland Kitchen   HEART:         Heart sounds:  S1 and S2 are normal. No murmur or click heard., no S3 or S4.   ABDOMEN:   There is no distention present. Liver and spleen are not palpable.  No other mass or tenderness present.    NEUROLOGICAL:   Ankle jerks are absent bilaterally.    Diabetic Foot Exam - Simple   No data filed             Vibration sense is  reduced in distal first toes.  MUSCULOSKELETAL:  There is no swelling or deformity of the peripheral joints.     EXTREMITIES:     There is no ankle edema.  SKIN:       No rash or lesions of concern.        ASSESSMENT:  Diabetes type 2  See history of present illness for detailed discussion of current diabetes management, blood sugar patterns and problems identified  Recent A1c indicates  Current treatment regimen is  Complications of diabetes:    PLAN:    1. Glucose monitoring: . Patient advised to  check readings either fasting or 2 hours after meals  2.  Diabetes education: . Patient will need  3.  Lifestyle changes: . Dietary changes: . Exercise regimen:  4.  Medication changes needed: .   5.  Preventive care needed:  .   6.  Follow-up:    There are no Patient Instructions on file for this visit.   Consultation note has been sent to the referring physician  Elayne Snare 07/16/2018, 2:54 PM   Note: This office note was prepared with Dragon voice recognition system technology. Any transcriptional errors that result from this process are unintentional.

## 2018-07-17 ENCOUNTER — Ambulatory Visit: Payer: No Typology Code available for payment source | Admitting: Endocrinology

## 2018-07-17 DIAGNOSIS — Z0289 Encounter for other administrative examinations: Secondary | ICD-10-CM

## 2018-07-22 MED FILL — $HUMALOG MIX 75-25 KWIKPEN: (75-25) 100 | 35 days supply | Qty: 60 | Fill #2

## 2018-07-30 ENCOUNTER — Telehealth: Payer: Self-pay

## 2018-07-30 NOTE — Telephone Encounter (Signed)
Called and spoke with patient. Advised of appointment tomorrow 07/31/2018. Patientt will keep this appointment. Thanks!

## 2018-07-31 ENCOUNTER — Other Ambulatory Visit: Payer: Self-pay

## 2018-07-31 ENCOUNTER — Ambulatory Visit (HOSPITAL_COMMUNITY)
Admission: RE | Admit: 2018-07-31 | Discharge: 2018-07-31 | Disposition: A | Discharge status: Home / Self Care | Payer: Self-pay | Source: Ambulatory Visit | Attending: Family Medicine | Admitting: Family Medicine

## 2018-07-31 ENCOUNTER — Ambulatory Visit (INDEPENDENT_AMBULATORY_CARE_PROVIDER_SITE_OTHER): Payer: Self-pay | Admitting: Family Medicine

## 2018-07-31 ENCOUNTER — Encounter: Payer: Self-pay | Admitting: Family Medicine

## 2018-07-31 VITALS — BP 149/73 | HR 85 | Temp 98.9°F | Resp 18 | Ht 64.0 in | Wt 268.0 lb

## 2018-07-31 DIAGNOSIS — M25561 Pain in right knee: Secondary | ICD-10-CM

## 2018-07-31 DIAGNOSIS — G8929 Other chronic pain: Secondary | ICD-10-CM

## 2018-07-31 DIAGNOSIS — J449 Chronic obstructive pulmonary disease, unspecified: Secondary | ICD-10-CM

## 2018-07-31 DIAGNOSIS — R569 Unspecified convulsions: Secondary | ICD-10-CM | POA: Insufficient documentation

## 2018-07-31 DIAGNOSIS — Z794 Long term (current) use of insulin: Secondary | ICD-10-CM

## 2018-07-31 DIAGNOSIS — E119 Type 2 diabetes mellitus without complications: Secondary | ICD-10-CM

## 2018-07-31 LAB — POCT GLYCOSYLATED HEMOGLOBIN (HGB A1C): Hemoglobin A1C: 7.1 % — AB (ref 4.0–5.6)

## 2018-07-31 LAB — POCT URINALYSIS DIP (CLINITEK)
Bilirubin, UA: NEGATIVE
Glucose, UA: NEGATIVE mg/dL
Ketones, POC UA: NEGATIVE mg/dL
Leukocytes, UA: NEGATIVE
Nitrite, UA: NEGATIVE
Spec Grav, UA: >=1.03 — AB (ref 1.010–1.025)
Urobilinogen, UA: 0.2 E.U./dL
pH, UA: 5.5 (ref 5.0–8.0)

## 2018-07-31 LAB — GLUCOSE, POCT (MANUAL RESULT ENTRY): POC Glucose: 140 mg/dl — AB (ref 70–99)

## 2018-07-31 MED ORDER — DOXYCYCLINE HYCLATE 100 MG PO TABS: 100.0000 mg | ORAL_TABLET | Freq: Two times a day (BID) | ORAL | 0 refills | Status: DC

## 2018-07-31 MED ORDER — ALBUTEROL SULFATE (2.5 MG/3ML) 0.083% IN NEBU: 2.5000 mg | INHALATION_SOLUTION | Freq: Four times a day (QID) | RESPIRATORY_TRACT | 1 refills | Status: DC | PRN

## 2018-07-31 MED ORDER — IPRATROPIUM BROMIDE 0.02 % IN SOLN
0.5000 mg | Freq: Once | RESPIRATORY_TRACT | Status: AC
  Administered 2018-07-31: 0.5 mg via RESPIRATORY_TRACT

## 2018-07-31 MED ORDER — METHYLPREDNISOLONE SODIUM SUCC 125 MG IJ SOLR
125.0000 mg | Freq: Once | INTRAMUSCULAR | Status: AC
  Administered 2018-07-31: 125 mg via INTRAMUSCULAR

## 2018-07-31 MED ORDER — ALBUTEROL SULFATE (2.5 MG/3ML) 0.083% IN NEBU
2.5000 mg | INHALATION_SOLUTION | Freq: Once | RESPIRATORY_TRACT | Status: AC
  Administered 2018-07-31: 2.5 mg via RESPIRATORY_TRACT

## 2018-07-31 NOTE — Progress Notes (Signed)
[facility]  Chronic Disease Follow Up Provider: Mike Gip, FNP  SUBJECTIVE:  Patient presents for follow up for the following  chronic conditions.    Hypertension/Diabetes/Hyperlipidemia Patient checks her BP at home "one day it's up and another day it's down". Patient states that she has episodes of hypoglycemia last week. States that her blood glucose was 35. Ate a peanut butter sandwich and increased to 200.  Patient states that she is smoking 1/2 ppd. She states that she is having increased SOB.   She is not exercising and is not adherent to a low-salt, ADA, heart healthy or carbohydrate modified diet. Patient states that she is not able to exercise due to lack of strength in her knee.  Patient states that she is supposed to have a right knee replacement and foot surgery. Patient states that she fell while using the bathroom x 2. Patient states that she thinks that she lost consciousness after falling. Patient states that she is unsure if she hit her head.  Patient states that she is working on obtaining disability. She states that she is supposed to see endocrinology and has not yet. Patient states that she also saw podiatry who recommended surgery.  Review of Systems  Respiratory: Positive for cough, sputum production, shortness of breath and wheezing.   Musculoskeletal: Positive for joint pain (right knee).    OBJECTIVE:  BP (!) 147/68 (BP Location: Right Arm, Patient Position: Sitting, Cuff Size: Large)   Pulse 86   Temp 98.9 F (37.2 C) (Oral)   Resp 18   Ht 5\' 4"  (1.626 m)   Wt 268 lb (121.6 kg)   LMP 07/28/2018   SpO2 100%   BMI 46.00 kg/m   Physical Exam  Constitutional: She is oriented to person, place, and time and well-developed, well-nourished, and in no distress. No distress.  HENT:  Head: Normocephalic and atraumatic.  Eyes: Pupils are equal, round, and reactive to light. Conjunctivae and EOM are  normal.  Neck: Normal range of motion. Neck supple.  Cardiovascular: Normal rate, regular rhythm and intact distal pulses. Exam reveals no gallop and no friction rub.  No murmur heard. Pulmonary/Chest: Effort normal. No respiratory distress. She has wheezes (throughout all lung fields. ).  Abdominal: Soft. Bowel sounds are normal. There is no tenderness.  Musculoskeletal: Normal range of motion. She exhibits no edema.       Right knee: She exhibits swelling (mild. ). Tenderness (ecchymosis) found.  Lymphadenopathy:    She has no cervical adenopathy.  Neurological: She is alert and oriented to person, place, and time. Gait normal.  Skin: Skin is warm and dry.  Psychiatric: Mood, memory, affect and judgment normal.  Nursing note and vitals reviewed.    ASSESSMENT/PLAN: 1. Type 2 diabetes mellitus without complication, with long-term current use of insulin (HCC) Pending labs. Will adjust medications accordingly.   - Glucose (CBG) - POCT URINALYSIS DIP (CLINITEK) - HgB A1c - Basic Metabolic Panel  2. Chronic obstructive pulmonary disease, unspecified COPD type (HCC) - albuterol (PROVENTIL) (2.5 MG/3ML) 0.083% nebulizer solution 2.5 mg - ipratropium (ATROVENT) nebulizer solution 0.5 mg - methylPREDNISolone sodium succinate (SOLU-MEDROL) 125 mg/2 mL injection 125 mg - doxycycline (VIBRA-TABS) 100 MG tablet; Take 1 tablet (100 mg total) by mouth 2 (two) times daily.  Dispense: 20 tablet; Refill: 0  3. Chronic pain of right knee Xray ordered today.  - DG Knee Complete 4 Views Right; Future  4. Morbid obesity (HCC) The  patient is asked to make an attempt to improve diet and exercise patterns to aid in medical management of this problem.     Return to care as scheduled and prn. Patient verbalized understanding and agreed with plan of care.   Ms. [name]. [name], FNP-BC [facility] [address] [place], [place]  [address] [phone]    The 2018 Physical Activity Guidelines recommend the equivalent of 150 minutes per week of moderate to vigorous aerobic activity each week, with muscle-strengthening activities on two days during the week    PRESCRIPTION FOR EXERCISE  Frequency Four to five days per week  Intensity Moderate- During moderate intensity exercise, a person is too winded to sing but is not so winded they cannot talk  Time 30 minutes or a total of 150 minutes per week.   Type Brisk walking PLUS One set each of: 0 Body weight squats  10 0 Plank hold for 30 seconds   Basic bodyweight exercise guide: Squat and plank  (A) Body weight squat: 0 The squat develops strength in the legs and torso. Stand with your feet about shoulder's width apart and slightly externally rotated. Maintain your weight on your heels or midfoot throughout the exercise. Keep your lower back flat; do not allow it to round or to extend excessively. Your knees should remain aligned with your ankles and legs throughout the motion (knees caving inward towards one another is a common problem). Descend until your hips come just below the level of your knees. If strength or mobility limitations prevent you from reaching this depth, begin with partial squats and gradually increase the depth over time. Once you can perform 15 squats with proper technique and full depth, make the exercise more challenging by holding a weight. (B) Standard plank: 0 The basic plank develops torso (core) strength. Maintain a straight torso, not allowing your lower back to sag or your hips to elevate, throughout the exercise. Gradually increase the time you can hold the position.

## 2018-07-31 NOTE — Patient Instructions (Signed)
I sent your medication to [facility]. I sent an antibiotic and medicine for the nebulizer.  Please remmeber to get your xray for the knee. I will see you again in 3 months.      Knee Pain, Adult Many things can cause knee pain. The pain often goes away on its own with time and rest. If the pain does not go away, tests may be done to find out what is causing the pain. Follow these instructions at home: Activity  Rest your knee.  Do not do things that cause pain.  Avoid activities where both feet leave the ground at the same time (high-impact activities). Examples are running, jumping rope, and doing jumping jacks. General instructions  Take medicines only as told by your doctor.  Raise (elevate) your knee when you are resting. Make sure your knee is higher than your heart.  Sleep with a pillow under your knee.  If told, put ice on the knee: ? Put ice in a plastic bag. ? Place a towel between your skin and the bag. ? Leave the ice on for 20 minutes, 2-3 times a day.  Ask your doctor if you should wear an elastic knee support.  Lose weight if you are overweight. Being overweight can make your knee hurt more.  Do not use any tobacco products. These include cigarettes, chewing tobacco, or electronic cigarettes. If you need help quitting, ask your doctor. Smoking may slow down healing. Contact a doctor if:  The pain does not stop.  The pain changes or gets worse.  You have a fever along with knee pain.  Your knee gives out or locks up.  Your knee swells, and becomes worse. Get help right away if:  Your knee feels warm.  You cannot move your knee.  You have very bad knee pain.  You have chest pain.  You have trouble breathing. Summary  Many things can cause knee pain. The pain often goes away on its own with time and rest.  Avoid activities that put stress on your knee. These include running and jumping rope.  Get help right away if you cannot  move your knee, or if your knee feels warm, or if you have trouble breathing. This information is not intended to replace advice given to you by your health care provider. Make sure you discuss any questions you have with your health care provider. Document Released: 12/29/2008 Document Revised: 09/26/2016 Document Reviewed: 09/26/2016 Elsevier Interactive Patient Education  2017 [employer] Diabetes Mellitus and Exercise Exercising regularly is important for your overall health, especially when you have diabetes (diabetes mellitus). Exercising is not only about losing weight. It has many health benefits, such as increasing muscle strength and bone density and reducing body fat and stress. This leads to improved fitness, flexibility, and endurance, all of which result in better overall health. Exercise has additional benefits for people with diabetes, including:  Reducing appetite.  Helping to lower and control blood glucose.  Lowering blood pressure.  Helping to control amounts of fatty substances (lipids) in the blood, such as cholesterol and triglycerides.  Helping the body to respond better to insulin (improving insulin sensitivity).  Reducing how much insulin the body needs.  Decreasing the risk for heart disease by: ? Lowering cholesterol and triglyceride levels. ? Increasing the levels of good cholesterol. ? Lowering blood glucose levels.  What is my activity plan? Your health care provider or certified diabetes educator can help you make a plan for  the type and frequency of exercise (activity plan) that works for you. Make sure that you:  Do at least 150 minutes of moderate-intensity or vigorous-intensity exercise each week. This could be brisk walking, biking, or water aerobics. ? Do stretching and strength exercises, such as yoga or weightlifting, at least 2 times a week. ? Spread out your activity over at least 3 days of the week.  Get some form of physical activity  every day. ? Do not go more than 2 days in a row without some kind of physical activity. ? Avoid being inactive for more than 90 minutes at a time. Take frequent breaks to walk or stretch.  Choose a type of exercise or activity that you enjoy, and set realistic goals.  Start slowly, and gradually increase the intensity of your exercise over time.  What do I need to know about managing my diabetes?  Check your blood glucose before and after exercising. ? If your blood glucose is higher than 240 mg/dL (16.1 mmol/L) before you exercise, check your urine for ketones. If you have ketones in your urine, do not exercise until your blood glucose returns to normal.  Know the symptoms of low blood glucose (hypoglycemia) and how to treat it. Your risk for hypoglycemia increases during and after exercise. Common symptoms of hypoglycemia can include: ? Hunger. ? Anxiety. ? Sweating and feeling clammy. ? Confusion. ? Dizziness or feeling light-headed. ? Increased heart rate or palpitations. ? Blurry vision. ? Tingling or numbness around the mouth, lips, or tongue. ? Tremors or shakes. ? Irritability.  Keep a rapid-acting carbohydrate snack available before, during, and after exercise to help prevent or treat hypoglycemia.  Avoid injecting insulin into areas of the body that are going to be exercised. For example, avoid injecting insulin into: ? The arms, when playing tennis. ? The legs, when jogging.  Keep records of your exercise habits. Doing this can help you and your health care provider adjust your diabetes management plan as needed. Write down: ? Food that you eat before and after you exercise. ? Blood glucose levels before and after you exercise. ? The type and amount of exercise you have done. ? When your insulin is expected to peak, if you use insulin. Avoid exercising at times when your insulin is peaking.  When you start a new exercise or activity, work with your health care  provider to make sure the activity is safe for you, and to adjust your insulin, medicines, or food intake as needed.  Drink plenty of water while you exercise to prevent dehydration or heat stroke. Drink enough fluid to keep your urine clear or pale yellow. This information is not intended to replace advice given to you by your health care provider. Make sure you discuss any questions you have with your health care provider. Document Released: 12/23/2003 Document Revised: 04/21/2016 Document Reviewed: 03/13/2016 Elsevier Interactive Patient Education  2018 Else[employer] Inc. Chronic Obstructive Pulmonary Disease Chronic obstructive pulmonary disease (COPD) is a long-term (chronic) lung problem. When you have COPD, it is hard for air to get in and out of your lungs. The way your lungs work will never return to normal. Usually the condition gets worse over time. There are things you can do to keep yourself as healthy as possible. Your doctor may treat your condition with:  Medicines.  Quitting smoking, if you smoke.  Rehabilitation. This may involve a team of specialists.  Oxygen.  Exercise and changes to your diet.  Lung surgery.  Comfort measures (palliative care).  Follow these instructions at home: Medicines  Take over-the-counter and prescription medicines only as told by your doctor.  Talk to your doctor before taking any cough or allergy medicines. You may need to avoid medicines that cause your lungs to be dry. Lifestyle  If you smoke, stop. Smoking makes the problem worse. If you need help quitting, ask your doctor.  Avoid being around things that make your breathing worse. This may include smoke, chemicals, and fumes.  Stay active, but remember to also rest.  Learn and use tips on how to relax.  Make sure you get enough sleep. Most adults need at least 7 hours a night.  Eat healthy foods. Eat smaller meals more often. Rest before meals. Controlled breathing  Learn and  use tips on how to control your breathing as told by your doctor. Try: ? Breathing in (inhaling) through your nose for 1 second. Then, pucker your lips and breath out (exhale) through your lips for 2 seconds. ? Putting one hand on your belly (abdomen). Breathe in slowly through your nose for 1 second. Your hand on your belly should move out. Pucker your lips and breathe out slowly through your lips. Your hand on your belly should move in as you breathe out. Controlled coughing  Learn and use controlled coughing to clear mucus from your lungs. The steps are: 1. Lean your head a little forward. 2. Breathe in deeply. 3. Try to hold your breath for 3 seconds. 4. Keep your mouth slightly open while coughing 2 times. 5. Spit any mucus out into a tissue. 6. Rest and do the steps again 1 or 2 times as needed. General instructions  Make sure you get all the shots (vaccines) that your doctor recommends. Ask your doctor about a flu shot and a pneumonia shot.  Use oxygen therapy and therapy to help improve your lungs (pulmonary rehabilitation) if told by your doctor. If you need home oxygen therapy, ask your doctor if you should buy a tool to measure your oxygen level (oximeter).  Make a COPD action plan with your doctor. This helps you know what to do if you feel worse than usual.  Manage any other conditions you have as told by your doctor.  Avoid going outside when it is very hot, cold, or humid.  Avoid people who have a sickness you can catch (contagious).  Keep all follow-up visits as told by your doctor. This is important. Contact a doctor if:  You cough up more mucus than usual.  There is a change in the color or thickness of the mucus.  It is harder to breathe than usual.  Your breathing is faster than usual.  You have trouble sleeping.  You need to use your medicines more often than usual.  You have trouble doing your normal activities such as getting dressed or walking around  the house. Get help right away if:  You have shortness of breath while resting.  You have shortness of breath that stops you from: ? Being able to talk. ? Doing normal activities.  Your chest hurts for longer than 5 minutes.  Your skin color is more blue than usual.  Your pulse oximeter shows that you have low oxygen for longer than 5 minutes.  You have a fever.  You feel too tired to breathe normally. Summary  Chronic obstructive pulmonary disease (COPD) is a long-term lung problem.  The way your lungs work will never return to normal. Usually the  condition gets worse over time. There are things you can do to keep yourself as healthy as possible.  Take over-the-counter and prescription medicines only as told by your doctor.  If you smoke, stop. Smoking makes the problem worse. This information is not intended to replace advice given to you by your health care provider. Make sure you discuss any questions you have with your health care provider. Document Released: 03/20/2008 Document Revised: 03/09/2016 Document Reviewed: 05/29/2013 Elsevier Interactive Patient Education  2017 [employer].

## 2018-08-01 LAB — BASIC METABOLIC PANEL
BUN/Creatinine Ratio: 12 (ref 9–23)
BUN: 12 mg/dL (ref 6–24)
CO2: 22 mmol/L (ref 20–29)
Calcium: 9.1 mg/dL (ref 8.7–10.2)
Chloride: 106 mmol/L (ref 96–106)
Creatinine, Ser: 1.01 mg/dL — ABNORMAL HIGH (ref 0.57–1.00)
GFR calc Af Amer: 80 mL/min/{1.73_m2} (ref >59)
GFR calc non Af Amer: 69 mL/min/{1.73_m2} (ref >59)
Glucose: 49 mg/dL — ABNORMAL LOW (ref 65–99)
Potassium: 4.1 mmol/L (ref 3.5–5.2)
Sodium: 144 mmol/L (ref 134–144)

## 2018-08-02 ENCOUNTER — Telehealth: Payer: Self-pay

## 2018-08-05 NOTE — Telephone Encounter (Signed)
Called, no answer. Left a message that xray shows arthritis and if she has any questions to call us back. Thanks!

## 2018-08-05 NOTE — Telephone Encounter (Signed)
The xray shows degenerative changes which indicates arthritis.

## 2018-08-05 NOTE — Telephone Encounter (Signed)
Jill Shaw, Any thing we need to advise patient of regarding xray?

## 2018-08-06 ENCOUNTER — Telehealth: Payer: Self-pay

## 2018-08-07 NOTE — Telephone Encounter (Signed)
Called, no answer. Left a message to advise patient that we are not going to order an MRI at this time due to results of xray showing arthritis. Thanks!

## 2018-08-15 ENCOUNTER — Other Ambulatory Visit: Payer: Self-pay | Admitting: Family Medicine

## 2018-08-22 MED FILL — $HUMALOG MIX 75-25 KWIKPEN: (75-25) 100 | 17 days supply | Qty: 30 | Fill #3

## 2018-08-27 ENCOUNTER — Telehealth: Payer: Self-pay

## 2018-08-27 NOTE — Telephone Encounter (Signed)
Called patient. She is running out of insulin because she states she has to take more than 85 units a lot of the time. Please advise if her dosage needs to be adjusted? She states she takes 95-110 units most days. Please advise, Thanks!

## 2018-08-28 NOTE — Telephone Encounter (Signed)
The prescription is written to to take 85 units every 12 hours. Is she taking 95-110 units every 12 hours? Did a provider instruct her to use a sliding scale with this insulin? Is she checking her glucose levels prior to adjusting? Is she following a carbohydrate modified diet? I cannot adjust anything until I know more about her situation.   She will need to come in for further evaluation. She will need to bring all medications and her glucose readings with her to this appointment. Also she needs to write down everything that she is consuming for the past week.

## 2018-08-28 NOTE — Telephone Encounter (Signed)
The prescription is written to to take 85 units every 12 hours. Is she taking 95-110 units every 12 hours? Did a provider instruct her to use a sliding scale with this insulin? Is she checking her glucose levels prior to adjusting? Is she following a carbohydrate modified diet? I cannot adjust anything until I know more about her situation.   She will need to come in for further evaluation. She will need to bring all medications and her glucose readings with her to this appointment. Also she needs to write down everything that she is consuming for the past week.   

## 2018-08-28 NOTE — Telephone Encounter (Signed)
Called, no answer. Left a message that patient will need to come in for this and bring all medication, glucose readings, and write down all she consumes for a week. Asked to call back and schedule an appointment. Thanks!~

## 2018-08-30 ENCOUNTER — Other Ambulatory Visit: Payer: Self-pay | Admitting: Family Medicine

## 2018-08-30 DIAGNOSIS — E119 Type 2 diabetes mellitus without complications: Secondary | ICD-10-CM

## 2018-08-30 DIAGNOSIS — Z794 Long term (current) use of insulin: Principal | ICD-10-CM

## 2018-09-02 ENCOUNTER — Telehealth: Payer: Self-pay

## 2018-09-02 ENCOUNTER — Encounter: Payer: Self-pay | Admitting: Family Medicine

## 2018-09-02 ENCOUNTER — Ambulatory Visit (INDEPENDENT_AMBULATORY_CARE_PROVIDER_SITE_OTHER): Payer: Self-pay | Admitting: Family Medicine

## 2018-09-02 VITALS — BP 170/88 | HR 88 | Temp 98.2°F | Ht 64.0 in | Wt 256.0 lb

## 2018-09-02 DIAGNOSIS — Z794 Long term (current) use of insulin: Secondary | ICD-10-CM

## 2018-09-02 DIAGNOSIS — E119 Type 2 diabetes mellitus without complications: Secondary | ICD-10-CM

## 2018-09-02 LAB — GLUCOSE, POCT (MANUAL RESULT ENTRY)
POC Glucose: 362 mg/dl — AB (ref 70–99)
POC Glucose: 395 mg/dl — AB (ref 70–99)

## 2018-09-02 LAB — POCT URINALYSIS DIP (MANUAL ENTRY)
Bilirubin, UA: NEGATIVE
Blood, UA: NEGATIVE
Glucose, UA: 500 mg/dL — AB
Leukocytes, UA: NEGATIVE
Nitrite, UA: NEGATIVE
Protein Ur, POC: NEGATIVE mg/dL
Spec Grav, UA: 1.01 (ref 1.010–1.025)
Urobilinogen, UA: 0.2 E.U./dL
pH, UA: 5.5 (ref 5.0–8.0)

## 2018-09-02 MED ORDER — INSULIN LISPRO PROT & LISPRO (75-25 MIX) 100 UNIT/ML KWIKPEN: PEN_INJECTOR | SUBCUTANEOUS | 11 refills | Status: DC

## 2018-09-02 MED ORDER — INSULIN LISPRO 100 UNIT/ML ~~LOC~~ SOLN
20.0000 [IU] | Freq: Once | SUBCUTANEOUS | Status: AC
  Administered 2018-09-02: 20 [IU] via SUBCUTANEOUS

## 2018-09-02 MED ORDER — CLONIDINE HCL 0.1 MG PO TABS
0.2000 mg | ORAL_TABLET | Freq: Once | ORAL | Status: AC
  Administered 2018-09-02: 0.2 mg via ORAL

## 2018-09-02 MED FILL — $HUMALOG MIX 75-25 KWIKPEN: (75-25) 100 | 21 days supply | Qty: 45 | Fill #0

## 2018-09-02 NOTE — Progress Notes (Signed)
  [facility]   Progress Note: Sick Visit Provider: Mike GipAndre Dell Briner, FNP  SUBJECTIVE:   Jill SauersJulie Mae Shaw is a 42 y.o. female who  has a past medical history of Anemia, Arthritis, Asthma, COPD (chronic obstructive pulmonary disease) (HCC), Diabetes mellitus without complication (HCC), GERD (gastroesophageal reflux disease), Hypertension, Neuromuscular disorder (HCC), Seizures (HCC), Sickle cell trait (HCC), and Smoker.. Patient presents today for Follow-up (Medication management) patient states that she is using a sliding scale for her lispro 75/25 insulin. States that she was told to do this years ago by several providers. She has been running out of medication early due to this. She has been prescribed 85 unit q12 h. Patient states that took her last insulin last night. Patient does not check FBS on a regular basis. She is uninsured and cannot go to endocrinology.  Review of Systems  Constitutional: Negative.   HENT: Negative.   Eyes: Negative.   Respiratory: Negative.   Cardiovascular: Negative.   Gastrointestinal: Negative.   Genitourinary: Negative.   Musculoskeletal: Negative.   Skin: Negative.   Neurological: Negative.   Psychiatric/Behavioral: Negative.      OBJECTIVE: Ht 5\' 4"  (1.626 m)   Wt 256 lb (116.1 kg)   LMP 08/28/2018   BMI 43.94 kg/m   Physical Exam  Constitutional: She is oriented to person, place, and time. She appears well-developed and well-nourished. No distress.  HENT:  Head: Normocephalic and atraumatic.  Eyes: Pupils are equal, round, and reactive to light. Conjunctivae and EOM are normal.  Neck: Normal range of motion.  Cardiovascular: Normal rate, regular rhythm, normal heart sounds and intact distal pulses.  Pulmonary/Chest: Effort normal and breath sounds normal. No respiratory distress.  Musculoskeletal: Normal range of motion.  Neurological: She is alert and oriented to person, place, and time.    Skin: Skin is warm and dry.  Psychiatric: She has a normal mood and affect. Her behavior is normal. Judgment and thought content normal.  Nursing note and vitals reviewed.   ASSESSMENT/PLAN: 1. Type 2 diabetes mellitus without complication, with long-term current use of insulin (HCC) Patient given 20 units humalog in office. She is + for ketones. Recommend IVF. Patient is unable to today due to time constraints.  Patient is stable and will return in the AM for IVF.Marland Kitchen. Medications sent to pharmacy on file. Increased to 100 unit of Lispro 75/25 BID.   - POCT urinalysis dipstick - POCT glucose (manual entry) - insulin lispro (HUMALOG) injection 20 Units - cloNIDine (CATAPRES) tablet 0.2 mg - Insulin Lispro Prot & Lispro (HUMALOG MIX 75/25 KWIKPEN) (75-25) 100 UNIT/ML Kwikpen; Inject 100 units every 12 hours  Dispense: 15 mL; Refill: 11      The patient was given clear instructions to go to ER or return to medical center if symptoms do not improve, worsen or new problems develop. The patient verbalized understanding and agreed with plan of care.   Ms. [name], FNP-BC [facility] [address]  [place], [place] [id number] [phone]     This note has been created with Dragon speech recognition software and smart phrase technology. Any transcriptional errors are unintentional.

## 2018-09-02 NOTE — Telephone Encounter (Signed)
Called patient and left message on VM. Patient has refills on her medication.  Called [facility] and no prescription on file. [facility] and [facility] are on lunch. [facility] another script [facility] Lispro to [facility].

## 2018-09-17 ENCOUNTER — Telehealth: Payer: Self-pay

## 2018-09-17 ENCOUNTER — Ambulatory Visit (INDEPENDENT_AMBULATORY_CARE_PROVIDER_SITE_OTHER): Payer: Self-pay | Admitting: Family Medicine

## 2018-09-17 VITALS — BP 163/77 | HR 98 | Temp 95.5°F | Resp 16 | Ht 64.0 in | Wt 272.0 lb

## 2018-09-17 DIAGNOSIS — I10 Essential (primary) hypertension: Secondary | ICD-10-CM

## 2018-09-17 DIAGNOSIS — Z130 Encounter for screening for diseases of the blood and blood-forming organs and certain disorders involving the immune mechanism: Secondary | ICD-10-CM

## 2018-09-17 MED ORDER — FUROSEMIDE 20 MG PO TABS: 20.0000 mg | ORAL_TABLET | Freq: Every day | ORAL | 3 refills | Status: DC

## 2018-09-17 MED FILL — FUROSEMIDE 20 MG TABLET: 20 | 30 days supply | Qty: 30 | Fill #0

## 2018-09-17 NOTE — Progress Notes (Signed)
Acute Office Visit  Subjective:    Patient ID: Jill Shaw, female    DOB: October 30, 1975, 42 y.o.   MRN: [id number]  Chief Complaint  Patient presents with  . Ear Problem    can't hear out of right ear   . Follow-up    wants to discuss her sickle cell trait   . Edema  . Eye Problem    Otalgia   There is pain in the right ear. This is a new problem. The current episode started 1 to 4 weeks ago. The problem occurs constantly. The problem has been gradually worsening. There has been no fever. The patient is experiencing no pain. Pertinent negatives include no ear discharge.   Patient is in today for vision screening. States that she is concerned that she needs glasses. Denies recent vision changes or blurred vision.  Patient states that she has noticed an increase in swelling of hands and legs. Patient denies following a low sodium diet or exercising due to knee pain.  Would like to know if she has sickle cell trait. She states that she was told that this is the reason for her body pain and frequent illnesses.   Past Medical History:  Diagnosis Date  . Anemia   . Arthritis    knees, hands  . Asthma   . COPD (chronic obstructive pulmonary disease) ([place])   . Diabetes mellitus without complication ([place])    type 2  . GERD (gastroesophageal reflux disease)   . Hypertension   . Neuromuscular disorder ([facility])    neuropathy feet  . Seizures ([place])    pt states r/t stress and blood sugar - no meds last one 4 months ago, not seen neurologist  . Sickle cell trait ([place])   . Smoker     Past Surgical History:  Procedure Laterality Date  . CESAREAN SECTION     x 1  . EYE SURGERY Bilateral    laser right and cataract removed left eye  . UPPER GI ENDOSCOPY  07/2017    Family History  Problem Relation Age of Onset  . Diabetes Mother   . Hypertension Mother     Social History   Socioeconomic History  . Marital status: Single    Spouse name: Not on file  . Number of children:  Not on file  . Years of education: Not on file  . Highest education level: Not on file  Occupational History  . Not on file  Social Needs  . Financial resource strain: Not on file  . Food insecurity:    Worry: Not on file    Inability: Not on file  . Transportation needs:    Medical: Not on file    Non-medical: Not on file  Tobacco Use  . Smoking status: Current Every Day Smoker    Packs/day: 0.25    Years: 15.00    Pack years: 3.75    Types: Cigarettes  . Smokeless tobacco: Never Used  Substance and Sexual Activity  . Alcohol use: No  . Drug use: No  . Sexual activity: Not Currently    Birth control/protection: None  Lifestyle  . Physical activity:    Days per week: Not on file    Minutes per session: Not on file  . Stress: Not on file  Relationships  . Social connections:    Talks on phone: Not on file    Gets together: Not on file    Attends religious service: Not on file  Active member of club or organization: Not on file    Attends meetings of clubs or organizations: Not on file    Relationship status: Not on file  . Intimate partner violence:    Fear of current or ex partner: Not on file    Emotionally abused: Not on file    Physically abused: Not on file    Forced sexual activity: Not on file  Other Topics Concern  . Not on file  Social History Narrative  . Not on file    Outpatient Medications Prior to Visit  Medication Sig Dispense Refill  . albuterol (PROVENTIL HFA;VENTOLIN HFA) 108 (90 Base) MCG/ACT inhaler Inhale 2 puffs into the lungs every 6 (six) hours as needed for wheezing or shortness of breath. 1 Inhaler 0  . albuterol (PROVENTIL) (2.5 MG/3ML) 0.083% nebulizer solution Take 3 mLs (2.5 mg total) by nebulization every 6 (six) hours as needed for wheezing or shortness of breath. 150 mL 1  . Blood Glucose Monitoring Suppl (TRUE METRIX METER) w/Device KIT 1 each by Does not apply route 4 (four) times daily -  before meals and at bedtime. 1 kit 0   . budesonide-formoterol (SYMBICORT) 160-4.5 MCG/ACT inhaler Inhale 2 puffs into the lungs 2 (two) times daily. 1 Inhaler 3  . cholecalciferol (VITAMIN D) 1000 units tablet Take 1,000 Units by mouth daily.    . Ferrous Sulfate (IRON) 325 (65 Fe) MG TABS Take 1 tablet (325 mg total) by mouth daily. 30 each 0  . fluticasone (FLONASE) 50 MCG/ACT nasal spray Place 2 sprays into both nostrils daily. 16 g 6  . glucose blood (TRUE METRIX BLOOD GLUCOSE TEST) test strip Use as instructed 100 each 12  . hydrochlorothiazide (HYDRODIURIL) 25 MG tablet TAKE 1 TABLETS(25 MG) BY MOUTH DAILY 90 tablet 1  . HYDROcodone-acetaminophen (NORCO) 7.5-325 MG tablet Take 1 tablet by mouth every 6 (six) hours as needed for moderate pain. 15 tablet 0  . Insulin Lispro Prot & Lispro (HUMALOG MIX 75/25 KWIKPEN) (75-25) 100 UNIT/ML Kwikpen Inject 100 units every 12 hours 15 mL 11  . losartan (COZAAR) 50 MG tablet Take 1 tablet (50 mg total) by mouth daily. 90 tablet 3  . metoprolol succinate (TOPROL-XL) 25 MG 24 hr tablet Take 0.5 tablets (12.5 mg total) by mouth daily. 30 tablet 5  . omeprazole (PRILOSEC) 40 MG capsule Take 1 capsule (40 mg total) by mouth daily. 30 capsule 3  . potassium chloride (K-DUR) 10 MEQ tablet Take 1 tablet (10 mEq total) by mouth daily. 30 tablet 0  . pregabalin (LYRICA) 75 MG capsule Take 1 capsule (75 mg total) by mouth 2 (two) times daily. 180 capsule 1  . saxagliptin HCl (ONGLYZA) 2.5 MG TABS tablet Take 1 tablet (2.5 mg total) by mouth daily. 90 tablet 3  . tizanidine (ZANAFLEX) 6 MG capsule TAKE 1 CAPSULE(6 MG) BY MOUTH THREE TIMES DAILY 90 capsule 0  . Vitamin D, Ergocalciferol, (DRISDOL) 50000 units CAPS capsule Take 1 capsule (50,000 Units total) by mouth every 7 (seven) days. 30 capsule 3   Facility-Administered Medications Prior to Visit  Medication Dose Route Frequency Provider Last Rate Last Dose  . betamethasone acetate-betamethasone sodium phosphate (CELESTONE) injection 3 mg  3 mg  Intramuscular Once Daylene Katayama M, DPM      . betamethasone acetate-betamethasone sodium phosphate (CELESTONE) injection 3 mg  3 mg Intramuscular Once Edrick Kins, DPM        Allergies  Allergen Reactions  . Ketoprofen Nausea And  Vomiting  . Aspirin Nausea Only  . Cortizone-10 [Hydrocortisone] Nausea Only  . Gabapentin Nausea And Vomiting    upset stomach  . Ibuprofen Nausea Only and Nausea And Vomiting  . Liraglutide Nausea And Vomiting  . Naproxen Nausea And Vomiting  . Omeprazole-Sodium Bicarbonate Nausea And Vomiting  . Other Nausea And Vomiting  . Sulfa Antibiotics Nausea And Vomiting  . Tramadol Nausea And Vomiting    stomach upset    Review of Systems  Constitutional: Negative.   HENT: Positive for ear pain. Negative for ear discharge.   Eyes: Negative.   Respiratory: Negative.   Cardiovascular: Positive for leg swelling.  Gastrointestinal: Negative.   Genitourinary: Negative.   Musculoskeletal: Negative.   Skin: Negative.   Neurological: Negative.   Psychiatric/Behavioral: Negative.        Objective:    Physical Exam  Constitutional: She is oriented to person, place, and time. She appears well-developed and well-nourished. No distress.  HENT:  Head: Normocephalic and atraumatic.  Right Ear: A foreign body (Cerumen impaction noted to the right ear. Patient expressed relief after lavage. ) is present.  Eyes: Pupils are equal, round, and reactive to light. Conjunctivae and EOM are normal.  Neck: Normal range of motion.  Cardiovascular: Normal rate, regular rhythm and normal heart sounds.  Pulmonary/Chest: Effort normal and breath sounds normal. No respiratory distress.  Musculoskeletal: Normal range of motion. She exhibits edema (2+ pitting edema noted to bilateral lower extremities. ).  Neurological: She is alert and oriented to person, place, and time.  Skin: Skin is warm and dry.  Psychiatric: She has a normal mood and affect. Her behavior is normal.  Judgment and thought content normal.  Nursing note and vitals reviewed.   Vision Screening  Edited by: Batten, Sherlon Handing, LPN   Right eye Left eye Both eyes  Without correction _0      BP (!) 163/77 (BP Location: Right Arm, Patient Position: Sitting, Cuff Size: Large)   Pulse 98   Temp (!) 95.5 F (35.3 C) (Oral)   Resp 16   Ht _1  (1.626 m)   Wt 272 lb (123.4 kg)   LMP 08/28/2018   SpO2 100%   BMI 46.69 kg/m  Wt Readings from Last 3 Encounters:  09/17/18 272 lb (123.4 kg)  09/02/18 256 lb (116.1 kg)  07/31/18 268 lb (121.6 kg)    Health Maintenance Due  Topic Date Due  . FOOT EXAM  09/22/2017  . OPHTHALMOLOGY EXAM  11/16/2017    There are no preventive care reminders to display for this patient.   Lab Results  Component Value Date   TSH 1.00 03/09/2017   Lab Results  Component Value Date   WBC 13.2 (H) 01/14/2018   HGB 10.1 (L) 01/14/2018   HCT 33.4 (L) 01/14/2018   MCV 70.6 (L) 01/14/2018   PLT 327 01/14/2018   Lab Results  Component Value Date   NA 144 07/31/2018   K 4.1 07/31/2018   CO2 22 07/31/2018   GLUCOSE 49 (L) 07/31/2018   BUN 12 07/31/2018   CREATININE 1.01 (H) 07/31/2018   BILITOT 0.8 02/13/2018   ALKPHOS 101 02/13/2018   AST 170 (H) 02/13/2018   ALT 44 02/13/2018   PROT 7.4 02/13/2018   ALBUMIN 3.6 02/13/2018   CALCIUM 9.1 07/31/2018   ANIONGAP 9 02/13/2018   Lab Results  Component Value Date   CHOL 136 03/09/2017   Lab Results  Component Value Date   HDL 55 03/09/2017  Lab Results  Component Value Date   LDLCALC 52 03/09/2017   Lab Results  Component Value Date   TRIG 144 03/09/2017   Lab Results  Component Value Date   CHOLHDL 2.5 03/09/2017   Lab Results  Component Value Date   HGBA1C 7.1 (A) 07/31/2018       Assessment & Plan:   Problem List Items Addressed This Visit    None     D/C HCTZ. Start Lasix 20 mg daily. RTC to check potassium in 4 weeks.   No orders of the defined types  were placed in this encounter.    Lanae Boast, FNP

## 2018-09-19 ENCOUNTER — Telehealth: Payer: Self-pay | Admitting: *Deleted

## 2018-09-19 ENCOUNTER — Encounter: Payer: Self-pay | Admitting: *Deleted

## 2018-09-19 ENCOUNTER — Telehealth: Payer: Self-pay

## 2018-09-19 LAB — COMPREHENSIVE METABOLIC PANEL
ALT: 24 IU/L (ref 0–32)
AST: 75 IU/L — ABNORMAL HIGH (ref 0–40)
Albumin/Globulin Ratio: 1.6 (ref 1.2–2.2)
Albumin: 4.1 g/dL (ref 3.5–5.5)
Alkaline Phosphatase: 101 IU/L (ref 39–117)
BUN/Creatinine Ratio: 11 (ref 9–23)
BUN: 11 mg/dL (ref 6–24)
Bilirubin Total: 0.4 mg/dL (ref 0.0–1.2)
CO2: 20 mmol/L (ref 20–29)
Calcium: 9.1 mg/dL (ref 8.7–10.2)
Chloride: 102 mmol/L (ref 96–106)
Creatinine, Ser: 0.98 mg/dL (ref 0.57–1.00)
GFR calc Af Amer: 83 mL/min/{1.73_m2} (ref >59)
GFR calc non Af Amer: 72 mL/min/{1.73_m2} (ref >59)
Globulin, Total: 2.5 g/dL (ref 1.5–4.5)
Glucose: 359 mg/dL — ABNORMAL HIGH (ref 65–99)
Potassium: 4.6 mmol/L (ref 3.5–5.2)
Sodium: 137 mmol/L (ref 134–144)
Total Protein: 6.6 g/dL (ref 6.0–8.5)

## 2018-09-19 LAB — HEMOGLOBINOPATHY EVALUATION
Ferritin: 13 ng/mL — ABNORMAL LOW (ref 15–150)
Hematocrit: 30.4 % — ABNORMAL LOW (ref 34.0–46.6)
Hemoglobin: 8.5 g/dL — ABNORMAL LOW (ref 11.1–15.9)
Hgb A2 Quant: 3.2 % (ref 1.8–3.2)
Hgb A: 68.8 % — ABNORMAL LOW (ref 96.4–98.8)
Hgb C: 0 %
Hgb F Quant: 0 % (ref 0.0–2.0)
Hgb S: 28 % — ABNORMAL HIGH
Hgb Solubility: POSITIVE — AB
Hgb Variant: 0 %
MCH: 19.7 pg — ABNORMAL LOW (ref 26.6–33.0)
MCHC: 28 g/dL — ABNORMAL LOW (ref 31.5–35.7)
MCV: 70 fL — ABNORMAL LOW (ref 79–97)
Platelets: 330 10*3/uL (ref 150–450)
RBC: 4.32 x10E6/uL (ref 3.77–5.28)
RDW: 22.2 % — ABNORMAL HIGH (ref 12.3–15.4)
WBC: 8.5 10*3/uL (ref 3.4–10.8)

## 2018-09-19 NOTE — Progress Notes (Signed)
Please let patient know that she does have the Sickle Cell Trait. She does not have Sickle Cell.  Also tell her that she is anemic and needs to make sure she is drinking plenty of water and taking and iron pill.

## 2018-09-19 NOTE — Telephone Encounter (Signed)
Pt presents to office for letter to lawyer for disability. I spoke with V. Hill - Contract[employer] coordinator, she stated pt needs to be seen and if has no insurance, can contact [facility] and they will process pt and her information for [facility] [employer] letter to enable pt to be seen in [facility] system. I told pt she would need to be seen by Dr. Logan BoresEvans to discuss and he was not in office. I offered pt the paper work for [employer] Letter and contact number for Patient Services. Pt states she only needs a note stating her surgery name and that she had been molded for orthotics. I told pt I could type a letter with the information about surgery possibility and orthotics. Letter given to pt.

## 2018-09-19 NOTE — Telephone Encounter (Signed)
Called. Patient was asking about her eye exam. I advised that vision was 20/40.

## 2018-09-20 ENCOUNTER — Telehealth: Payer: Self-pay

## 2018-09-20 NOTE — Telephone Encounter (Signed)
-----   Message from Mike GipAndre Douglas, FNP sent at 09/19/2018  5:27 PM EST ----- Please let patient know that she does have the Sickle Cell Trait. She does not have Sickle Cell.  Also tell her that she is anemic and needs to make sure she is drinking plenty of water and taking and iron pill.

## 2018-09-20 NOTE — Telephone Encounter (Signed)
Called, no answer. Left a message that she has sickle cell trait but not sickle cell. Advised that she is anemic and needs to drink plenty of water and take iron pill daily. Asked if any questions to call back to our office. Thanks !

## 2018-09-23 MED FILL — HUMALOG MIX 75-25 KWIKPEN: (75-25) 100 | 21 days supply | Qty: 45 | Fill #1

## 2018-10-11 MED FILL — DOXYCYCLINE HYCLATE 100 MG: 100 | 10 days supply | Qty: 20 | Fill #0

## 2018-10-17 MED FILL — HUMALOG MIX 75-25 KWIKPEN: (75-25) 100 | 30 days supply | Qty: 60 | Fill #2

## 2018-10-20 ENCOUNTER — Encounter (HOSPITAL_COMMUNITY): Payer: Self-pay | Admitting: Obstetrics and Gynecology

## 2018-10-20 ENCOUNTER — Emergency Department (HOSPITAL_COMMUNITY)
Admission: EM | Admit: 2018-10-20 | Discharge: 2018-10-20 | Disposition: A | Discharge status: Home / Self Care | Payer: Self-pay | Attending: Emergency Medicine | Admitting: Emergency Medicine

## 2018-10-20 ENCOUNTER — Emergency Department (HOSPITAL_COMMUNITY): Payer: Self-pay

## 2018-10-20 ENCOUNTER — Other Ambulatory Visit: Payer: Self-pay

## 2018-10-20 DIAGNOSIS — R519 Headache, unspecified: Secondary | ICD-10-CM

## 2018-10-20 DIAGNOSIS — M25561 Pain in right knee: Secondary | ICD-10-CM | POA: Insufficient documentation

## 2018-10-20 DIAGNOSIS — I10 Essential (primary) hypertension: Secondary | ICD-10-CM | POA: Insufficient documentation

## 2018-10-20 DIAGNOSIS — Z79899 Other long term (current) drug therapy: Secondary | ICD-10-CM | POA: Insufficient documentation

## 2018-10-20 DIAGNOSIS — Z794 Long term (current) use of insulin: Secondary | ICD-10-CM | POA: Insufficient documentation

## 2018-10-20 DIAGNOSIS — M1710 Unilateral primary osteoarthritis, unspecified knee: Secondary | ICD-10-CM

## 2018-10-20 DIAGNOSIS — R739 Hyperglycemia, unspecified: Secondary | ICD-10-CM

## 2018-10-20 DIAGNOSIS — R51 Headache: Secondary | ICD-10-CM | POA: Insufficient documentation

## 2018-10-20 DIAGNOSIS — E119 Type 2 diabetes mellitus without complications: Secondary | ICD-10-CM | POA: Insufficient documentation

## 2018-10-20 DIAGNOSIS — F17210 Nicotine dependence, cigarettes, uncomplicated: Secondary | ICD-10-CM | POA: Insufficient documentation

## 2018-10-20 LAB — COMPREHENSIVE METABOLIC PANEL
ALBUMIN: 3.6 g/dL (ref 3.5–5.0)
ALT: 14 U/L (ref 0–44)
AST: 32 U/L (ref 15–41)
Alkaline Phosphatase: 116 U/L (ref 38–126)
Anion gap: 7 (ref 5–15)
BUN: 13 mg/dL (ref 6–20)
CO2: 25 mmol/L (ref 22–32)
CREATININE: 0.97 mg/dL (ref 0.44–1.00)
Calcium: 8.8 mg/dL — ABNORMAL LOW (ref 8.9–10.3)
Chloride: 107 mmol/L (ref 98–111)
GFR calc Af Amer: >60 mL/min (ref >60)
GFR calc non Af Amer: >60 mL/min (ref >60)
Glucose, Bld: 292 mg/dL — ABNORMAL HIGH (ref 70–99)
Potassium: 4 mmol/L (ref 3.5–5.1)
Sodium: 139 mmol/L (ref 135–145)
Total Bilirubin: 0.4 mg/dL (ref 0.3–1.2)
Total Protein: 6.6 g/dL (ref 6.5–8.1)

## 2018-10-20 LAB — CBG MONITORING, ED
GLUCOSE-CAPILLARY: 300 mg/dL — AB (ref 70–99)
Glucose-Capillary: 317 mg/dL — ABNORMAL HIGH (ref 70–99)
Glucose-Capillary: 265 mg/dL — ABNORMAL HIGH (ref 70–99)

## 2018-10-20 LAB — CBC WITH DIFFERENTIAL/PLATELET
ABS IMMATURE GRANULOCYTES: 0.04 10*3/uL (ref 0.00–0.07)
Basophils Absolute: 0.1 10*3/uL (ref 0.0–0.1)
Basophils Relative: 1 %
EOS PCT: 2 %
Eosinophils Absolute: 0.2 10*3/uL (ref 0.0–0.5)
HCT: 29.6 % — ABNORMAL LOW (ref 36.0–46.0)
Hemoglobin: 8.6 g/dL — ABNORMAL LOW (ref 12.0–15.0)
Immature Granulocytes: 1 %
Lymphocytes Relative: 25 %
Lymphs Abs: 2.2 10*3/uL (ref 0.7–4.0)
MCH: 20.4 pg — ABNORMAL LOW (ref 26.0–34.0)
MCHC: 29.1 g/dL — ABNORMAL LOW (ref 30.0–36.0)
MCV: 70.3 fL — ABNORMAL LOW (ref 80.0–100.0)
Monocytes Absolute: 0.5 10*3/uL (ref 0.1–1.0)
Monocytes Relative: 6 %
Neutro Abs: 5.7 10*3/uL (ref 1.7–7.7)
Neutrophils Relative %: 65 %
Platelets: 196 10*3/uL (ref 150–400)
RBC: 4.21 MIL/uL (ref 3.87–5.11)
RDW: 22.9 % — ABNORMAL HIGH (ref 11.5–15.5)
WBC: 8.6 10*3/uL (ref 4.0–10.5)
nRBC: 0 % (ref 0.0–0.2)

## 2018-10-20 LAB — RETICULOCYTES
Immature Retic Fract: 30.4 % — ABNORMAL HIGH (ref 2.3–15.9)
RBC.: 4.21 MIL/uL (ref 3.87–5.11)
RETIC CT PCT: 1.5 % (ref 0.4–3.1)
Retic Count, Absolute: 61.5 10*3/uL (ref 19.0–186.0)

## 2018-10-20 LAB — CK: Total CK: 191 U/L (ref 38–234)

## 2018-10-20 LAB — I-STAT BETA HCG BLOOD, ED (MC, WL, AP ONLY): I-stat hCG, quantitative: <5 m[IU]/mL (ref <5)

## 2018-10-20 LAB — I-STAT TROPONIN, ED: Troponin i, poc: 0 ng/mL (ref 0.00–0.08)

## 2018-10-20 LAB — BRAIN NATRIURETIC PEPTIDE: B Natriuretic Peptide: 66.7 pg/mL (ref 0.0–100.0)

## 2018-10-20 MED ORDER — SODIUM CHLORIDE 0.9 % IV BOLUS: 1000.0000 mL | Freq: Once | INTRAVENOUS | Status: DC

## 2018-10-20 MED ORDER — MORPHINE SULFATE (PF) 4 MG/ML IV SOLN
4.0000 mg | Freq: Once | INTRAVENOUS | Status: AC
  Administered 2018-10-20: 4 mg via INTRAVENOUS
  Filled 2018-10-20: qty 1

## 2018-10-20 MED ORDER — METOCLOPRAMIDE HCL 5 MG/ML IJ SOLN
10.0000 mg | Freq: Once | INTRAMUSCULAR | Status: AC
  Administered 2018-10-20: 10 mg via INTRAVENOUS
  Filled 2018-10-20: qty 2

## 2018-10-20 MED ORDER — DIPHENHYDRAMINE HCL 50 MG/ML IJ SOLN
25.0000 mg | Freq: Once | INTRAMUSCULAR | Status: AC
  Administered 2018-10-20: 25 mg via INTRAVENOUS
  Filled 2018-10-20: qty 1

## 2018-10-20 MED ORDER — INSULIN ASPART 100 UNIT/ML ~~LOC~~ SOLN
5.0000 [IU] | Freq: Once | SUBCUTANEOUS | Status: AC
  Administered 2018-10-20: 5 [IU] via SUBCUTANEOUS
  Filled 2018-10-20: qty 1

## 2018-10-20 NOTE — ED Provider Notes (Signed)
[facility] DEPT Provider Note   CSN: [id number] Arrival date & time: 10/20/18  1725     History   Chief Complaint Chief Complaint  Patient presents with  . Head Injury    HPI Jill Shaw is a 43 y.o. female history of COPD, diabetes, reflux, hypertension, sickle cell trait here presenting with headaches, leg and knee pain.  Patient states that she did hit her head about a month ago after she passed out.  She states that she also hit her right knee as well.  She has been having intermittent headaches since then as well as right knee pain.  She denies any vomiting or photophobia.  She also states that she has some numbness and tingling down her right arm as well. She states that she feels that her legs are swollen as well and had some nonproductive cough.  She denies any chest pain or shortness of breath.  She states that she was told she has sickle cell trait but has not seen a sickle cell doctor yet.  She states that her blood sugar has been elevated and her blood pressure has been elevated as well.   The history is provided by the patient.    Past Medical History:  Diagnosis Date  . Anemia   . Arthritis    knees, hands  . Asthma   . COPD (chronic obstructive pulmonary disease) ([place])   . Diabetes mellitus without complication ([place])    type 2  . GERD (gastroesophageal reflux disease)   . Hypertension   . Neuromuscular disorder ([facility])    neuropathy feet  . Seizures ([place])    pt states r/t stress and blood sugar - no meds last one 4 months ago, not seen neurologist  . Sickle cell trait ([place])   . Smoker     Patient Active Problem List   Diagnosis Date Noted  . Seizure ([place]) 07/31/2018  . Menorrhagia with irregular cycle 05/17/2017  . Anemia of chronic disease 04/06/2017  . Knee pain, chronic 03/27/2016  . Type 2 diabetes mellitus without complication, with long-term current use of insulin ([place]) 03/27/2016  . Essential hypertension  03/27/2016  . Morbid obesity ([place]) 03/27/2016  . Irritable bowel syndrome with constipation 03/27/2016  . Tobacco dependence 03/27/2016    Past Surgical History:  Procedure Laterality Date  . CESAREAN SECTION     x 1  . EYE SURGERY Bilateral    laser right and cataract removed left eye  . UPPER GI ENDOSCOPY  07/2017     OB History   No obstetric history on file.      Home Medications    Prior to Admission medications   Medication Sig Start Date End Date Taking? Authorizing Provider  albuterol (PROVENTIL HFA;VENTOLIN HFA) 108 (90 Base) MCG/ACT inhaler Inhale 2 puffs into the lungs every 6 (six) hours as needed for wheezing or shortness of breath. 04/01/17  Yes Dorena Dew, FNP  albuterol (PROVENTIL) (2.5 MG/3ML) 0.083% nebulizer solution Take 3 mLs (2.5 mg total) by nebulization every 6 (six) hours as needed for wheezing or shortness of breath. 07/31/18  Yes Lanae Boast, FNP  Blood Glucose Monitoring Suppl (TRUE METRIX METER) w/Device KIT 1 each by Does not apply route 4 (four) times daily -  before meals and at bedtime. 01/21/18  Yes Dorena Dew, FNP  budesonide-formoterol (SYMBICORT) 160-4.5 MCG/ACT inhaler Inhale 2 puffs into the lungs 2 (two) times daily. 01/07/18  Yes Dorena Dew, FNP  cholecalciferol (  VITAMIN D) 1000 units tablet Take 1,000 Units by mouth daily.   Yes [provider]  Ferrous Sulfate (IRON) 325 (65 Fe) MG TABS Take 1 tablet (325 mg total) by mouth daily. 06/21/17  Yes Dorena Dew, FNP  fluticasone (FLONASE) 50 MCG/ACT nasal spray Place 2 sprays into both nostrils daily. 06/21/17  Yes Dorena Dew, FNP  furosemide (LASIX) 20 MG tablet Take 1 tablet (20 mg total) by mouth daily. 09/17/18  Yes Lanae Boast, FNP  hydrochlorothiazide (HYDRODIURIL) 25 MG tablet TAKE 1 TABLETS(25 MG) BY MOUTH DAILY Patient taking differently: Take 25 mg by mouth daily.  08/31/17  Yes Dorena Dew, FNP  HYDROcodone-acetaminophen (NORCO) 7.5-325  MG tablet Take 1 tablet by mouth every 6 (six) hours as needed for moderate pain. 10/17/17  Yes Edrick Kins, DPM  Insulin Lispro Prot & Lispro (HUMALOG MIX 75/25 KWIKPEN) (75-25) 100 UNIT/ML Kwikpen Inject 100 units every 12 hours Patient taking differently: Inject 100 Units into the skin every 12 (twelve) hours.  09/02/18  Yes Lanae Boast, FNP  losartan (COZAAR) 50 MG tablet Take 1 tablet (50 mg total) by mouth daily. 06/21/17  Yes Dorena Dew, FNP  metoprolol succinate (TOPROL-XL) 25 MG 24 hr tablet Take 0.5 tablets (12.5 mg total) by mouth daily. 11/01/17  Yes Dorena Dew, FNP  omeprazole (PRILOSEC) 40 MG capsule Take 1 capsule (40 mg total) by mouth daily. 12/20/17  Yes Dorena Dew, FNP  potassium chloride (K-DUR) 10 MEQ tablet Take 1 tablet (10 mEq total) by mouth daily. 12/20/17  Yes Dorena Dew, FNP  pregabalin (LYRICA) 75 MG capsule Take 1 capsule (75 mg total) by mouth 2 (two) times daily. 10/18/17  Yes Charlott Rakes, MD  saxagliptin HCl (ONGLYZA) 2.5 MG TABS tablet Take 1 tablet (2.5 mg total) by mouth daily. 06/21/17  Yes Dorena Dew, FNP  tizanidine (ZANAFLEX) 6 MG capsule TAKE 1 CAPSULE(6 MG) BY MOUTH THREE TIMES DAILY Patient taking differently: Take 6 mg by mouth 3 (three) times daily.  06/13/18  Yes Lanae Boast, FNP  Vitamin D, Ergocalciferol, (DRISDOL) 50000 units CAPS capsule Take 1 capsule (50,000 Units total) by mouth every 7 (seven) days. 06/21/17  Yes Dorena Dew, FNP  glucose blood (TRUE METRIX BLOOD GLUCOSE TEST) test strip Use as instructed 01/21/18   Dorena Dew, FNP    Family History Family History  Problem Relation Age of Onset  . Diabetes Mother   . Hypertension Mother     Social History Social History   Tobacco Use  . Smoking status: Current Every Day Smoker    Packs/day: 0.25    Years: 15.00    Pack years: 3.75    Types: Cigarettes  . Smokeless tobacco: Never Used  Substance Use Topics  . Alcohol use: No  . Drug use:  No     Allergies   Ketoprofen; Aspirin; Cortizone-10 [hydrocortisone]; Gabapentin; Ibuprofen; Liraglutide; Naproxen; Omeprazole-sodium bicarbonate; Other; Sulfa antibiotics; and Tramadol   Review of Systems Review of Systems  Musculoskeletal:       R knee pain   Neurological: Positive for headaches.  All other systems reviewed and are negative.    Physical Exam Updated Vital Signs BP (!) 160/77 (BP Location: Left Arm)   Pulse 91   Temp 97.9 F (36.6 C)   Resp 18   Ht '5\' 4"'$  (1.626 m)   Wt 122 kg   LMP 10/11/2018   SpO2 100%   BMI 46.17 kg/m  Physical Exam Vitals signs and nursing note reviewed.  HENT:     Head: Normocephalic.     Comments: No obvious scalp hematoma     Mouth/Throat:     Mouth: Mucous membranes are moist.  Eyes:     Extraocular Movements: Extraocular movements intact.     Pupils: Pupils are equal, round, and reactive to light.  Neck:     Comments: R paracervical tenderness  Cardiovascular:     Rate and Rhythm: Normal rate and regular rhythm.  Pulmonary:     Effort: Pulmonary effort is normal.     Breath sounds: Normal breath sounds.  Abdominal:     General: Abdomen is flat.     Palpations: Abdomen is soft.  Musculoskeletal:     Comments: Mild R knee effusion but nl ROM   Skin:    General: Skin is warm.     Capillary Refill: Capillary refill takes less than 2 seconds.  Neurological:     General: No focal deficit present.     Mental Status: She is alert and oriented to person, place, and time.     Cranial Nerves: No cranial nerve deficit.     Sensory: No sensory deficit.     Motor: No weakness.  Psychiatric:        Mood and Affect: Mood normal.      ED Treatments / Results  Labs (all labs ordered are listed, but only abnormal results are displayed) Labs Reviewed  CBC WITH DIFFERENTIAL/PLATELET - Abnormal; Notable for the following components:      Result Value   Hemoglobin 8.6 (*)    HCT 29.6 (*)    MCV 70.3 (*)    MCH 20.4  (*)    MCHC 29.1 (*)    RDW 22.9 (*)    All other components within normal limits  COMPREHENSIVE METABOLIC PANEL - Abnormal; Notable for the following components:   Glucose, Bld 292 (*)    Calcium 8.8 (*)    All other components within normal limits  RETICULOCYTES - Abnormal; Notable for the following components:   Immature Retic Fract 30.4 (*)    All other components within normal limits  CBG MONITORING, ED - Abnormal; Notable for the following components:   Glucose-Capillary 300 (*)    All other components within normal limits  CBG MONITORING, ED - Abnormal; Notable for the following components:   Glucose-Capillary 317 (*)    All other components within normal limits  CK  BRAIN NATRIURETIC PEPTIDE  I-STAT BETA HCG BLOOD, ED ([facility], [facility], [facility] ONLY)  I-STAT TROPONIN, ED  CBG MONITORING, ED    EKG EKG Interpretation  Date/Time:  Sunday October 20 2018 20:34:19 EST Ventricular Rate:  90 PR Interval:    QRS Duration: 84 QT Interval:  377 QTC Calculation: 462 R Axis:   63 Text Interpretation:  Sinus rhythm Low voltage, precordial leads No significant change since last tracing Confirmed by Wandra Arthurs [phone]) on 10/20/2018 9:40:54 PM   Radiology Dg Chest 2 View  Result Date: 10/20/2018 CLINICAL DATA:  Chest pain. Fall. EXAM: CHEST - 2 VIEW COMPARISON:  Radiograph 01/14/2018 FINDINGS: The cardiomediastinal contours are unchanged with borderline cardiomegaly. Chronic bronchitic changes, stable from prior. Pulmonary vasculature is normal. No consolidation, pleural effusion, or pneumothorax. No acute osseous abnormalities are seen. IMPRESSION: Chronic bronchitic change without acute abnormality. Electronically Signed   By: Keith Rake M.D.   On: 10/20/2018 20:03   Ct Head Wo Contrast  Result Date: 10/20/2018 CLINICAL DATA:  Head trauma. Pain. EXAM: CT HEAD WITHOUT CONTRAST CT CERVICAL SPINE WITHOUT CONTRAST TECHNIQUE: Multidetector CT imaging of the head and cervical spine was  performed following the standard protocol without intravenous contrast. Multiplanar CT image reconstructions of the cervical spine were also generated. COMPARISON:  None. FINDINGS: CT HEAD FINDINGS BRAIN: The ventricles and sulci are normal. No intraparenchymal hemorrhage, mass effect nor midline shift. No acute large vascular territory infarcts. No abnormal extra-axial fluid collections. Basal cisterns are midline and not effaced. No acute cerebellar abnormality. VASCULAR: Unremarkable. SKULL/SOFT TISSUES: No skull fracture. No significant soft tissue swelling. ORBITS/SINUSES: The included ocular globes and orbital contents are normal.The mastoid air-cells and included paranasal sinuses are well-aerated. OTHER: None. CT CERVICAL SPINE FINDINGS ALIGNMENT: Vertebral bodies in alignment. Slight reversal and straightening of cervical lordosis which may be due to muscle spasm or patient positioning. SKULL BASE AND VERTEBRAE: Cervical vertebral bodies and posterior elements are intact. Intervertebral disc heights preserved. No destructive bony lesions. C1-2 articulation maintained. SOFT TISSUES AND SPINAL CANAL: Normal. DISC LEVELS: No significant osseous canal stenosis or neural foraminal narrowing. UPPER CHEST: Lung apices are clear. OTHER: None. IMPRESSION: No acute intracranial or cervical spinal abormality. Electronically Signed   By: Ashley Royalty M.D.   On: 10/20/2018 20:10   Ct Cervical Spine Wo Contrast  Result Date: 10/20/2018 CLINICAL DATA:  Head trauma. Pain. EXAM: CT HEAD WITHOUT CONTRAST CT CERVICAL SPINE WITHOUT CONTRAST TECHNIQUE: Multidetector CT imaging of the head and cervical spine was performed following the standard protocol without intravenous contrast. Multiplanar CT image reconstructions of the cervical spine were also generated. COMPARISON:  None. FINDINGS: CT HEAD FINDINGS BRAIN: The ventricles and sulci are normal. No intraparenchymal hemorrhage, mass effect nor midline shift. No acute large  vascular territory infarcts. No abnormal extra-axial fluid collections. Basal cisterns are midline and not effaced. No acute cerebellar abnormality. VASCULAR: Unremarkable. SKULL/SOFT TISSUES: No skull fracture. No significant soft tissue swelling. ORBITS/SINUSES: The included ocular globes and orbital contents are normal.The mastoid air-cells and included paranasal sinuses are well-aerated. OTHER: None. CT CERVICAL SPINE FINDINGS ALIGNMENT: Vertebral bodies in alignment. Slight reversal and straightening of cervical lordosis which may be due to muscle spasm or patient positioning. SKULL BASE AND VERTEBRAE: Cervical vertebral bodies and posterior elements are intact. Intervertebral disc heights preserved. No destructive bony lesions. C1-2 articulation maintained. SOFT TISSUES AND SPINAL CANAL: Normal. DISC LEVELS: No significant osseous canal stenosis or neural foraminal narrowing. UPPER CHEST: Lung apices are clear. OTHER: None. IMPRESSION: No acute intracranial or cervical spinal abormality. Electronically Signed   By: Ashley Royalty M.D.   On: 10/20/2018 20:10   Dg Knee Complete 4 Views Right  Result Date: 10/20/2018 CLINICAL DATA:  Right knee pain after fall. EXAM: RIGHT KNEE - COMPLETE 4+ VIEW COMPARISON:  Radiographs 07/31/2018 FINDINGS: No acute fracture or dislocation. Trace patellofemoral spurring and spurring of tibial spines. Mild medial tibiofemoral joint space narrowing again seen. Tiny suprapatellar joint effusion. IMPRESSION: 1. No acute fracture or dislocation of the right knee. 2. Stable mild osteoarthritis. Electronically Signed   By: Keith Rake M.D.   On: 10/20/2018 20:04    Procedures Procedures (including critical care time)  Medications Ordered in ED Medications  metoCLOPramide (REGLAN) injection 10 mg (10 mg Intravenous Given 10/20/18 2057)  diphenhydrAMINE (BENADRYL) injection 25 mg (25 mg Intravenous Given 10/20/18 2100)  morphine 4 MG/ML injection 4 mg (4 mg Intravenous Given  10/20/18 2102)  insulin aspart (novoLOG) injection 5 Units (5 Units Subcutaneous Given 10/20/18 2057)  Initial Impression / Assessment and Plan / ED Course  I have reviewed the triage vital signs and the nursing notes.  Pertinent labs & imaging results that were available during my care of the patient were reviewed by me and considered in my medical decision making (see chart for details).    Jill Shaw is a 43 y.o. female here with headaches after head injury, paresthesias, R knee pain. Symptoms for the last month or so. Patient states that she has sickle cell trait. I wonder if she has sickle cell crisis vs viral syndrome. Since she had head injury and some paresthesias, will get CT head/neck. Will check labs and reassess.   10:15 PM Hg 8.6, similar to previous. Reticulocyte count negative. She states that she thought she has sickle cell and would like to see a sickle cell doctor.  Her right knee x-ray showed arthritis which is likely causing her pain in the right knee.  Blood sugar went from 320 down to 260 now.  Stable for discharge   Final Clinical Impressions(s) / ED Diagnoses   Final diagnoses:  None    ED Discharge Orders    None       Drenda Freeze, MD 10/20/18 2217

## 2018-10-20 NOTE — ED Notes (Signed)
Patient transported to X-ray 

## 2018-10-20 NOTE — ED Triage Notes (Signed)
Pt reports she loss consciousness about a month ago and hit her head. Pt reports she is having severe headaches and that her legs and knees hurt. Pt is requesting a total body MRI.

## 2018-10-20 NOTE — Discharge Instructions (Addendum)
Your blood sugar and blood pressure are elevated. Please take your medicines as prescribed.   You have arthritis in the right knee that can cause your knee pain   You can follow up with sickle cell clinic since you mentioned that you have sickle trait   Return to ER if you have worse headaches, vomiting, knee pain, fever.

## 2018-10-21 ENCOUNTER — Telehealth: Payer: Self-pay

## 2018-10-21 NOTE — Telephone Encounter (Signed)
Called, no answer. Went straight to [employer]. Voicemail was full

## 2018-10-23 ENCOUNTER — Encounter: Payer: Self-pay | Admitting: Family Medicine

## 2018-10-23 ENCOUNTER — Ambulatory Visit (INDEPENDENT_AMBULATORY_CARE_PROVIDER_SITE_OTHER): Payer: Self-pay | Admitting: Family Medicine

## 2018-10-23 VITALS — BP 160/82 | HR 92 | Temp 98.3°F | Resp 16 | Ht 64.0 in | Wt 269.0 lb

## 2018-10-23 DIAGNOSIS — D509 Iron deficiency anemia, unspecified: Secondary | ICD-10-CM

## 2018-10-23 DIAGNOSIS — D573 Sickle-cell trait: Secondary | ICD-10-CM

## 2018-10-23 DIAGNOSIS — I10 Essential (primary) hypertension: Secondary | ICD-10-CM

## 2018-10-23 DIAGNOSIS — H5712 Ocular pain, left eye: Secondary | ICD-10-CM

## 2018-10-23 MED ORDER — LOSARTAN POTASSIUM 100 MG PO TABS: 100.0000 mg | ORAL_TABLET | Freq: Every day | ORAL | 3 refills | Status: DC

## 2018-10-23 MED ORDER — FUROSEMIDE 40 MG PO TABS: 40.0000 mg | ORAL_TABLET | Freq: Every day | ORAL | 3 refills | Status: DC

## 2018-10-23 MED ORDER — FERROUS SULFATE 300 (60 FE) MG/5ML PO SYRP: 300.0000 mg | ORAL_SOLUTION | Freq: Every day | ORAL | 3 refills | Status: DC

## 2018-10-23 NOTE — Progress Notes (Signed)
Acute Office Visit  Subjective:    Patient ID: Jill Shaw, female    DOB: Sep 20, 1976, 43 y.o.   MRN: [id number]  Chief Complaint  Patient presents with  . Follow-up    discuss sickle cell trait wants something for pain   . Hypertension  . Eye Pain    left eye pain   . Headache    HPI  Patient reports having body pain, headache and left eye pain. Patient states that she has heavy periods that last 10 days at a time. She was told that she needs to have an ablation or hysterectomy. She reports that she has not been able to follow up after losing her medicaid. Patient does not have an active orange card or the [place] discount.  Patient continues to smoke 1/2 ppd. Not interested in quitting.  Seen by Dr. Hulan Fray who recommended ablation or IUD for menorrhagia. Patient also states that she has left eye pain with blurred vision. She reports a hx of multiple surgeries to the left eye and states that the pain is occurring due to her needing another surgery.    Past Medical History:  Diagnosis Date  . Anemia   . Arthritis    knees, hands  . Asthma   . COPD (chronic obstructive pulmonary disease) ([place])   . Diabetes mellitus without complication ([place])    type 2  . GERD (gastroesophageal reflux disease)   . Hypertension   . Neuromuscular disorder ([facility])    neuropathy feet  . Seizures ([place])    pt states r/t stress and blood sugar - no meds last one 4 months ago, not seen neurologist  . Sickle cell trait ([place])   . Smoker     Past Surgical History:  Procedure Laterality Date  . CESAREAN SECTION     x 1  . EYE SURGERY Bilateral    laser right and cataract removed left eye  . UPPER GI ENDOSCOPY  07/2017    Family History  Problem Relation Age of Onset  . Diabetes Mother   . Hypertension Mother     Social History   Socioeconomic History  . Marital status: Single    Spouse name: Not on file  . Number of children: Not on file  . Years of education: Not on file  .  Highest education level: Not on file  Occupational History  . Not on file  Social Needs  . Financial resource strain: Not on file  . Food insecurity:    Worry: Not on file    Inability: Not on file  . Transportation needs:    Medical: Not on file    Non-medical: Not on file  Tobacco Use  . Smoking status: Current Every Day Smoker    Packs/day: 0.25    Years: 15.00    Pack years: 3.75    Types: Cigarettes  . Smokeless tobacco: Never Used  Substance and Sexual Activity  . Alcohol use: No  . Drug use: No  . Sexual activity: Not Currently    Birth control/protection: None  Lifestyle  . Physical activity:    Days per week: Not on file    Minutes per session: Not on file  . Stress: Not on file  Relationships  . Social connections:    Talks on phone: Not on file    Gets together: Not on file    Attends religious service: Not on file    Active member of club or organization: Not on file  Attends meetings of clubs or organizations: Not on file    Relationship status: Not on file  . Intimate partner violence:    Fear of current or ex partner: Not on file    Emotionally abused: Not on file    Physically abused: Not on file    Forced sexual activity: Not on file  Other Topics Concern  . Not on file  Social History Narrative  . Not on file    Outpatient Medications Prior to Visit  Medication Sig Dispense Refill  . albuterol (PROVENTIL HFA;VENTOLIN HFA) 108 (90 Base) MCG/ACT inhaler Inhale 2 puffs into the lungs every 6 (six) hours as needed for wheezing or shortness of breath. 1 Inhaler 0  . albuterol (PROVENTIL) (2.5 MG/3ML) 0.083% nebulizer solution Take 3 mLs (2.5 mg total) by nebulization every 6 (six) hours as needed for wheezing or shortness of breath. 150 mL 1  . Blood Glucose Monitoring Suppl (TRUE METRIX METER) w/Device KIT 1 each by Does not apply route 4 (four) times daily -  before meals and at bedtime. 1 kit 0  . budesonide-formoterol (SYMBICORT) 160-4.5 MCG/ACT  inhaler Inhale 2 puffs into the lungs 2 (two) times daily. 1 Inhaler 3  . cholecalciferol (VITAMIN D) 1000 units tablet Take 1,000 Units by mouth daily.    . fluticasone (FLONASE) 50 MCG/ACT nasal spray Place 2 sprays into both nostrils daily. 16 g 6  . glucose blood (TRUE METRIX BLOOD GLUCOSE TEST) test strip Use as instructed 100 each 12  . Insulin Lispro Prot & Lispro (HUMALOG MIX 75/25 KWIKPEN) (75-25) 100 UNIT/ML Kwikpen Inject 100 units every 12 hours (Patient taking differently: Inject 100 Units into the skin every 12 (twelve) hours. ) 15 mL 11  . metoprolol succinate (TOPROL-XL) 25 MG 24 hr tablet Take 0.5 tablets (12.5 mg total) by mouth daily. 30 tablet 5  . omeprazole (PRILOSEC) 40 MG capsule Take 1 capsule (40 mg total) by mouth daily. 30 capsule 3  . potassium chloride (K-DUR) 10 MEQ tablet Take 1 tablet (10 mEq total) by mouth daily. 30 tablet 0  . pregabalin (LYRICA) 75 MG capsule Take 1 capsule (75 mg total) by mouth 2 (two) times daily. 180 capsule 1  . saxagliptin HCl (ONGLYZA) 2.5 MG TABS tablet Take 1 tablet (2.5 mg total) by mouth daily. 90 tablet 3  . tizanidine (ZANAFLEX) 6 MG capsule TAKE 1 CAPSULE(6 MG) BY MOUTH THREE TIMES DAILY (Patient taking differently: Take 6 mg by mouth 3 (three) times daily. ) 90 capsule 0  . Vitamin D, Ergocalciferol, (DRISDOL) 50000 units CAPS capsule Take 1 capsule (50,000 Units total) by mouth every 7 (seven) days. 30 capsule 3  . Ferrous Sulfate (IRON) 325 (65 Fe) MG TABS Take 1 tablet (325 mg total) by mouth daily. 30 each 0  . furosemide (LASIX) 20 MG tablet Take 1 tablet (20 mg total) by mouth daily. 30 tablet 3  . hydrochlorothiazide (HYDRODIURIL) 25 MG tablet TAKE 1 TABLETS(25 MG) BY MOUTH DAILY (Patient taking differently: Take 25 mg by mouth daily. ) 90 tablet 1  . losartan (COZAAR) 50 MG tablet Take 1 tablet (50 mg total) by mouth daily. 90 tablet 3  . HYDROcodone-acetaminophen (NORCO) 7.5-325 MG tablet Take 1 tablet by mouth every 6  (six) hours as needed for moderate pain. (Patient not taking: Reported on 10/23/2018) 15 tablet 0   Facility-Administered Medications Prior to Visit  Medication Dose Route Frequency Provider Last Rate Last Dose  . betamethasone acetate-betamethasone sodium phosphate (CELESTONE) injection  3 mg  3 mg Intramuscular Once Daylene Katayama M, DPM      . betamethasone acetate-betamethasone sodium phosphate (CELESTONE) injection 3 mg  3 mg Intramuscular Once Edrick Kins, DPM        Allergies  Allergen Reactions  . Ketoprofen Nausea And Vomiting  . Aspirin Nausea Only  . Cortizone-10 [Hydrocortisone] Nausea Only  . Gabapentin Nausea And Vomiting    upset stomach  . Ibuprofen Nausea Only and Nausea And Vomiting  . Liraglutide Nausea And Vomiting  . Naproxen Nausea And Vomiting  . Omeprazole-Sodium Bicarbonate Nausea And Vomiting  . Other Nausea And Vomiting  . Sulfa Antibiotics Nausea And Vomiting  . Tramadol Nausea And Vomiting    stomach upset    Review of Systems  Constitutional: Negative.   HENT: Negative.   Eyes: Positive for pain, discharge and redness.  Respiratory: Negative.   Cardiovascular: Negative.   Gastrointestinal: Negative.   Genitourinary: Negative.   Musculoskeletal: Positive for back pain, joint pain and myalgias.  Skin: Negative.   Neurological: Negative.   Psychiatric/Behavioral: Negative.        Objective:    Physical Exam  Constitutional: She is oriented to person, place, and time. She appears well-developed and well-nourished. No distress.  HENT:  Head: Normocephalic and atraumatic.  Eyes: Pupils are equal, round, and reactive to light. EOM are normal. Left eye exhibits discharge (clear). Left conjunctiva is injected.  Neck: Normal range of motion.  Cardiovascular: Normal rate, regular rhythm and normal heart sounds.  Pulmonary/Chest: Effort normal and breath sounds normal. No respiratory distress.  Musculoskeletal: Normal range of motion.        General:  Edema (bilateral lower extremities) present.  Neurological: She is alert and oriented to person, place, and time.  Skin: Skin is warm and dry.  Psychiatric: She has a normal mood and affect. Her behavior is normal. Judgment and thought content normal.  Nursing note and vitals reviewed.   BP (!) 160/82 (BP Location: Right Arm, Patient Position: Sitting, Cuff Size: Large) Comment: manually  Pulse 92   Temp 98.3 F (36.8 C) (Oral)   Resp 16   Ht '5\' 4"'$  (1.626 m)   Wt 269 lb (122 kg)   LMP 10/11/2018   SpO2 100%   BMI 46.17 kg/m  Wt Readings from Last 3 Encounters:  10/23/18 269 lb (122 kg)  10/20/18 269 lb (122 kg)  09/17/18 272 lb (123.4 kg)    Health Maintenance Due  Topic Date Due  . FOOT EXAM  09/22/2017  . OPHTHALMOLOGY EXAM  11/16/2017    There are no preventive care reminders to display for this patient.   Lab Results  Component Value Date   TSH 1.00 03/09/2017   Lab Results  Component Value Date   WBC 8.6 10/20/2018   HGB 8.6 (L) 10/20/2018   HCT 29.6 (L) 10/20/2018   MCV 70.3 (L) 10/20/2018   PLT 196 10/20/2018   Lab Results  Component Value Date   NA 139 10/20/2018   K 4.0 10/20/2018   CO2 25 10/20/2018   GLUCOSE 292 (H) 10/20/2018   BUN 13 10/20/2018   CREATININE 0.97 10/20/2018   BILITOT 0.4 10/20/2018   ALKPHOS 116 10/20/2018   AST 32 10/20/2018   ALT 14 10/20/2018   PROT 6.6 10/20/2018   ALBUMIN 3.6 10/20/2018   CALCIUM 8.8 (L) 10/20/2018   ANIONGAP 7 10/20/2018   Lab Results  Component Value Date   CHOL 136 03/09/2017   Lab Results  Component Value Date   HDL 55 03/09/2017   Lab Results  Component Value Date   LDLCALC 52 03/09/2017   Lab Results  Component Value Date   TRIG 144 03/09/2017   Lab Results  Component Value Date   CHOLHDL 2.5 03/09/2017   Lab Results  Component Value Date   HGBA1C 7.1 (A) 07/31/2018       Assessment & Plan:   Problem List Items Addressed This Visit      Cardiovascular and Mediastinum    Essential hypertension   Relevant Medications   losartan (COZAAR) 100 MG tablet   furosemide (LASIX) 40 MG tablet    Other Visit Diagnoses    Sickle cell trait (HCC)    -  Primary   Iron deficiency anemia, unspecified iron deficiency anemia type       Relevant Medications   ferrous sulfate 300 (60 Fe) MG/5ML syrup   Eye pain, left       Relevant Orders   Ambulatory referral to Ophthalmology      Increased Cozaar to '100mg'$  PO QD. Increased lasix to '40mg'$  QD. Stop HCTZ.  Referred to opthalmology for further evaluation. Advised patient to consider IUD such as mirena for menorrhagia. [facility] to [facility] or [facility] for this.   Lanae Boast, FNP

## 2018-10-23 NOTE — Patient Instructions (Signed)
I recommend that you go to [facility] or the [facility] for an IUD such as Mirena for the heavy bleeding.

## 2018-10-24 ENCOUNTER — Telehealth: Payer: Self-pay | Admitting: Podiatry

## 2018-10-24 NOTE — Telephone Encounter (Signed)
I'm calling to speak to Dr. Logan Bores' nurse.

## 2018-10-24 NOTE — Telephone Encounter (Signed)
Pt lvm on general mailbox asking for the nurse to give her a call back. Pt was last seen in our office Oct 2018.

## 2018-10-25 NOTE — Telephone Encounter (Signed)
LFT MESSAGE TO CALL AGAIN.

## 2018-10-25 NOTE — Telephone Encounter (Signed)
Pt came to office, and I informed front staff, that Dr. Logan Bores would see her with an appt. Pt asked cost of self-pay and was informed $200.00, she stated she would call again on Monday.

## 2018-10-29 ENCOUNTER — Telehealth: Payer: Self-pay

## 2018-10-30 ENCOUNTER — Telehealth: Payer: Self-pay

## 2018-10-30 NOTE — Telephone Encounter (Signed)
Patient is asking if she can get a letter saying that her eye problem needs to be treated asap and that we are concerned for her vision. Please advise if this can be done.

## 2018-10-30 NOTE — Telephone Encounter (Signed)
Called, no answer.

## 2018-10-31 ENCOUNTER — Ambulatory Visit: Payer: Self-pay | Admitting: Family Medicine

## 2018-10-31 NOTE — Telephone Encounter (Signed)
Called and made patient aware that we need records from optometrist. She states she will come sign record release. Thanks!

## 2018-10-31 NOTE — Telephone Encounter (Signed)
I will need her previous records from her optometrist.

## 2018-11-08 ENCOUNTER — Ambulatory Visit (INDEPENDENT_AMBULATORY_CARE_PROVIDER_SITE_OTHER): Payer: Self-pay | Admitting: Family Medicine

## 2018-11-08 VITALS — BP 150/72 | HR 93 | Temp 99.1°F | Resp 18 | Ht 64.0 in | Wt 271.0 lb

## 2018-11-08 DIAGNOSIS — I10 Essential (primary) hypertension: Secondary | ICD-10-CM

## 2018-11-08 LAB — POCT URINALYSIS DIPSTICK
Bilirubin, UA: NEGATIVE
Blood, UA: NEGATIVE
Glucose, UA: NEGATIVE
Ketones, UA: NEGATIVE
Leukocytes, UA: NEGATIVE
Nitrite, UA: NEGATIVE
Protein, UA: POSITIVE — AB
Spec Grav, UA: 1.02 (ref 1.010–1.025)
Urobilinogen, UA: 0.2 E.U./dL
pH, UA: 5.5 (ref 5.0–8.0)

## 2018-11-08 MED ORDER — AMLODIPINE BESYLATE 5 MG PO TABS: 5.0000 mg | ORAL_TABLET | Freq: Every day | ORAL | 3 refills | Status: DC

## 2018-11-08 NOTE — Patient Instructions (Signed)
I ordered a renal ultrasound to check your kidneys. I also want you to record everything that you are eating for the next 2 weeks. I sent your medications to the pharmacy.  Continue with all of your medications. I added amlodipine. Do not smoke 2 hours prior to your next visit.    DASH Eating Plan DASH stands for "Dietary Approaches to Stop Hypertension." The DASH eating plan is a healthy eating plan that has been shown to reduce high blood pressure (hypertension). It may also reduce your risk for type 2 diabetes, heart disease, and stroke. The DASH eating plan may also help with weight loss. What are tips for following this plan?  General guidelines  Avoid eating more than 2,300 mg (milligrams) of salt (sodium) a day. If you have hypertension, you may need to reduce your sodium intake to 1,500 mg a day.  Limit alcohol intake to no more than 1 drink a day for nonpregnant women and 2 drinks a day for men. One drink equals 12 oz of beer, 5 oz of wine, or 1 oz of hard liquor.  Work with your health care provider to maintain a healthy body weight or to lose weight. Ask what an ideal weight is for you.  Get at least 30 minutes of exercise that causes your heart to beat faster (aerobic exercise) most days of the week. Activities may include walking, swimming, or biking.  Work with your health care provider or diet and nutrition specialist (dietitian) to adjust your eating plan to your individual calorie needs. Reading food labels   Check food labels for the amount of sodium per serving. Choose foods with less than 5 percent of the Daily Value of sodium. Generally, foods with less than 300 mg of sodium per serving fit into this eating plan.  To find whole grains, look for the word "whole" as the first word in the ingredient list. Shopping  Buy products labeled as "low-sodium" or "no salt added."  Buy fresh foods. Avoid canned foods and premade or frozen meals. Cooking  Avoid adding salt  when cooking. Use salt-free seasonings or herbs instead of table salt or sea salt. Check with your health care provider or pharmacist before using salt substitutes.  Do not fry foods. Cook foods using healthy methods such as baking, boiling, grilling, and broiling instead.  Cook with heart-healthy oils, such as olive, canola, soybean, or sunflower oil. Meal planning  Eat a balanced diet that includes: ? 5 or more servings of fruits and vegetables each day. At each meal, try to fill half of your plate with fruits and vegetables. ? Up to 6-8 servings of whole grains each day. ? Less than 6 oz of lean meat, poultry, or fish each day. A 3-oz serving of meat is about the same size as a deck of cards. One egg equals 1 oz. ? 2 servings of low-fat dairy each day. ? A serving of nuts, seeds, or beans 5 times each week. ? Heart-healthy fats. Healthy fats called Omega-3 fatty acids are found in foods such as flaxseeds and coldwater fish, like sardines, salmon, and mackerel.  Limit how much you eat of the following: ? Canned or prepackaged foods. ? Food that is high in trans fat, such as fried foods. ? Food that is high in saturated fat, such as fatty meat. ? Sweets, desserts, sugary drinks, and other foods with added sugar. ? Full-fat dairy products.  Do not salt foods before eating.  Try to eat at least  2 vegetarian meals each week.  Eat more home-cooked food and less restaurant, buffet, and fast food.  When eating at a restaurant, ask that your food be prepared with less salt or no salt, if possible. What foods are recommended? The items listed may not be a complete list. Talk with your dietitian about what dietary choices are best for you. Grains Whole-grain or whole-wheat bread. Whole-grain or whole-wheat pasta. Brown rice. [place]. Bulgur. Whole-grain and low-sodium cereals. Pita bread. Low-fat, low-sodium crackers. Whole-wheat flour tortillas. Vegetables Fresh or frozen  vegetables (raw, steamed, roasted, or grilled). Low-sodium or reduced-sodium tomato and vegetable juice. Low-sodium or reduced-sodium tomato sauce and tomato paste. Low-sodium or reduced-sodium canned vegetables. Fruits All fresh, dried, or frozen fruit. Canned fruit in natural juice (without added sugar). Meat and other protein foods Skinless chicken or [place]. Ground chicken or [place]. Pork with fat trimmed off. Fish and seafood. Egg whites. Dried beans, peas, or lentils. Unsalted nuts, nut butters, and seeds. Unsalted canned beans. Lean cuts of beef with fat trimmed off. Low-sodium, lean deli meat. Dairy Low-fat (1%) or fat-free (skim) milk. Fat-free, low-fat, or reduced-fat cheeses. Nonfat, low-sodium ricotta or cottage cheese. Low-fat or nonfat yogurt. Low-fat, low-sodium cheese. Fats and oils Soft margarine without trans fats. Vegetable oil. Low-fat, reduced-fat, or light mayonnaise and salad dressings (reduced-sodium). Canola, safflower, olive, soybean, and sunflower oils. Avocado. Seasoning and other foods Herbs. Spices. Seasoning mixes without salt. Unsalted popcorn and pretzels. Fat-free sweets. What foods are not recommended? The items listed may not be a complete list. Talk with your dietitian about what dietary choices are best for you. Grains Baked goods made with fat, such as croissants, muffins, or some breads. Dry pasta or rice meal packs. Vegetables Creamed or fried vegetables. Vegetables in a cheese sauce. Regular canned vegetables (not low-sodium or reduced-sodium). Regular canned tomato sauce and paste (not low-sodium or reduced-sodium). Regular tomato and vegetable juice (not low-sodium or reduced-sodium). Angie Fava. Olives. Fruits Canned fruit in a light or heavy syrup. Fried fruit. Fruit in cream or butter sauce. Meat and other protein foods Fatty cuts of meat. Ribs. Fried meat. Berniece Salines. Sausage. Bologna and other processed lunch meats. Salami. Fatback. Hotdogs. Bratwurst.  Salted nuts and seeds. Canned beans with added salt. Canned or smoked fish. Whole eggs or egg yolks. Chicken or [place] with skin. Dairy Whole or 2% milk, cream, and half-and-half. Whole or full-fat cream cheese. Whole-fat or sweetened yogurt. Full-fat cheese. Nondairy creamers. Whipped toppings. Processed cheese and cheese spreads. Fats and oils Butter. Stick margarine. Lard. Shortening. Ghee. Bacon fat. Tropical oils, such as coconut, palm kernel, or palm oil. Seasoning and other foods Salted popcorn and pretzels. Onion salt, garlic salt, seasoned salt, table salt, and sea salt. Worcestershire sauce. Tartar sauce. Barbecue sauce. Teriyaki sauce. Soy sauce, including reduced-sodium. Steak sauce. Canned and packaged gravies. Fish sauce. Oyster sauce. Cocktail sauce. Horseradish that you find on the shelf. Ketchup. Mustard. Meat flavorings and tenderizers. Bouillon cubes. Hot sauce and Tabasco sauce. Premade or packaged marinades. Premade or packaged taco seasonings. Relishes. Regular salad dressings. Where to find more information:  National Heart, Lung, and [place]: https://wilson-eaton.com/  American Heart Association: www.heart.org Summary  The DASH eating plan is a healthy eating plan that has been shown to reduce high blood pressure (hypertension). It may also reduce your risk for type 2 diabetes, heart disease, and stroke.  With the DASH eating plan, you should limit salt (sodium) intake to 2,300 mg a day. If you have hypertension, you may  need to reduce your sodium intake to 1,500 mg a day.  When on the DASH eating plan, aim to eat more fresh fruits and vegetables, whole grains, lean proteins, low-fat dairy, and heart-healthy fats.  Work with your health care provider or diet and nutrition specialist (dietitian) to adjust your eating plan to your individual calorie needs. This information is not intended to replace advice given to you by your health care provider. Make sure you discuss any  questions you have with your health care provider. Document Released: 09/21/2011 Document Revised: 09/25/2016 Document Reviewed: 09/25/2016 Elsevier Interactive Patient Education  2019 [employer]  Hypertension Hypertension is another name for high blood pressure. High blood pressure forces your heart to work harder to pump blood. This can cause problems over time. There are two numbers in a blood pressure reading. There is a top number (systolic) over a bottom number (diastolic). It is best to have a blood pressure below 120/80. Healthy choices can help lower your blood pressure. You may need medicine to help lower your blood pressure if:  Your blood pressure cannot be lowered with healthy choices.  Your blood pressure is higher than 130/80. Follow these instructions at home: Eating and drinking   If directed, follow the DASH eating plan. This diet includes: ? Filling half of your plate at each meal with fruits and vegetables. ? Filling one quarter of your plate at each meal with whole grains. Whole grains include whole wheat pasta, brown rice, and whole grain bread. ? Eating or drinking low-fat dairy products, such as skim milk or low-fat yogurt. ? Filling one quarter of your plate at each meal with low-fat (lean) proteins. Low-fat proteins include fish, skinless chicken, eggs, beans, and tofu. ? Avoiding fatty meat, cured and processed meat, or chicken with skin. ? Avoiding premade or processed food.  Eat less than 1,500 mg of salt (sodium) a day.  Limit alcohol use to no more than 1 drink a day for nonpregnant women and 2 drinks a day for men. One drink equals 12 oz of beer, 5 oz of wine, or 1 oz of hard liquor. Lifestyle  Work with your doctor to stay at a healthy weight or to lose weight. Ask your doctor what the best weight is for you.  Get at least 30 minutes of exercise that causes your heart to beat faster (aerobic exercise) most days of the week. This may include walking,  swimming, or biking.  Get at least 30 minutes of exercise that strengthens your muscles (resistance exercise) at least 3 days a week. This may include lifting weights or pilates.  Do not use any products that contain nicotine or tobacco. This includes cigarettes and e-cigarettes. If you need help quitting, ask your doctor.  Check your blood pressure at home as told by your doctor.  Keep all follow-up visits as told by your doctor. This is important. Medicines  Take over-the-counter and prescription medicines only as told by your doctor. Follow directions carefully.  Do not skip doses of blood pressure medicine. The medicine does not work as well if you skip doses. Skipping doses also puts you at risk for problems.  Ask your doctor about side effects or reactions to medicines that you should watch for. Contact a doctor if:  You think you are having a reaction to the medicine you are taking.  You have headaches that keep coming back (recurring).  You feel dizzy.  You have swelling in your ankles.  You have trouble with  your vision. Get help right away if:  You get a very bad headache.  You start to feel confused.  You feel weak or numb.  You feel faint.  You get very bad pain in your: ? Chest. ? Belly (abdomen).  You throw up (vomit) more than once.  You have trouble breathing. Summary  Hypertension is another name for high blood pressure.  Making healthy choices can help lower blood pressure. If your blood pressure cannot be controlled with healthy choices, you may need to take medicine. This information is not intended to replace advice given to you by your health care provider. Make sure you discuss any questions you have with your health care provider. Document Released: 03/20/2008 Document Revised: 08/30/2016 Document Reviewed: 08/30/2016 Elsevier Interactive Patient Education  2019 [employer].

## 2018-11-08 NOTE — Progress Notes (Signed)
[facility]   Progress Note: General Provider: Mike Gip, FNP  SUBJECTIVE:   Jill Shaw is a 43 y.o. female who  has a past medical history of Anemia, Arthritis, Asthma, COPD (chronic obstructive pulmonary disease) (HCC), Diabetes mellitus without complication (HCC), GERD (gastroesophageal reflux disease), Hypertension, Neuromuscular disorder (HCC), Seizures (HCC), Sickle cell trait (HCC), and Smoker.. Patient presents today for Hypertension   Patient states that she is continuing to have swelling of the lower extremities.  Patient reports compliance with daily medications.  She denies side effects of medications.  She does not eat a low-sodium or carb modified diet.  She does not exercise.  She continues to smoke and is not interested in quitting at the present time. Review of Systems  Constitutional: Negative.   HENT: Negative.   Eyes: Negative.   Respiratory: Negative.   Cardiovascular: Positive for leg swelling.  Gastrointestinal: Negative.   Genitourinary: Negative.   Musculoskeletal: Negative.   Skin: Negative.   Neurological: Negative.   Psychiatric/Behavioral: Negative.      OBJECTIVE: BP (!) 150/72 Comment: manually  Pulse 93   Temp 99.1 F (37.3 C) (Oral)   Resp 18   Ht 5\' 4"  (1.626 m)   Wt 271 lb (122.9 kg)   LMP 10/11/2018   SpO2 100%   BMI 46.52 kg/m   Wt Readings from Last 3 Encounters:  11/08/18 271 lb (122.9 kg)  10/23/18 269 lb (122 kg)  10/20/18 269 lb (122 kg)     Physical Exam Vitals signs and nursing note reviewed.  Constitutional:      General: She is not in acute distress.    Appearance: She is well-developed.  HENT:     Head: Normocephalic and atraumatic.  Eyes:     Conjunctiva/sclera: Conjunctivae normal.     Pupils: Pupils are equal, round, and reactive to light.  Neck:     Musculoskeletal: Normal range of motion.  Cardiovascular:     Rate and Rhythm: Normal rate and regular  rhythm.     Heart sounds: Normal heart sounds.  Pulmonary:     Effort: Pulmonary effort is normal. No respiratory distress.     Breath sounds: Normal breath sounds.  Abdominal:     General: Bowel sounds are normal. There is no distension.     Palpations: Abdomen is soft.  Musculoskeletal: Normal range of motion.     Right lower leg: Edema (Bilateral nonpitting edema) present.     Left lower leg: Edema (Bilateral nonpitting edema) present.  Skin:    General: Skin is warm and dry.  Neurological:     Mental Status: She is alert and oriented to person, place, and time.  Psychiatric:        Behavior: Behavior normal.        Thought Content: Thought content normal.     ASSESSMENT/PLAN:   1. Essential hypertension Continue with current medications as previously prescribed.  We will add amlodipine 5 mg 1 tablet daily.  Also will schedule a renal ultrasound to rule out renal stenosis.  Patient advised to record her food intake for the next 2 weeks and bring it in with her to her next visit.  She also has been advised not to smoke prior to her visit and to take her medications at the least 2 hours prior to coming to her next office visit.  Patient needs to lose weight to help control her blood pressure. - amLODipine (NORVASC) 5 MG tablet;  Take 1 tablet (5 mg total) by mouth daily.  Dispense: 90 tablet; Refill: 3 - US Renal; Future - Urinalysis Dipstick    Return in about 2 weeks (around 11/22/2018) for HTN- new medication.    The patient was given clear instructions to go to ER or return to medical center if symptoms do not improve, worsen or new problems develop. The patient verbalized understanding and agreed with plan of care.   Ms. [name]. [name], FNP-BC [facility] [address] [place], [place] [address] [phone]

## 2018-11-09 ENCOUNTER — Encounter (HOSPITAL_COMMUNITY): Payer: Self-pay | Admitting: *Deleted

## 2018-11-09 ENCOUNTER — Emergency Department (HOSPITAL_COMMUNITY)
Admission: EM | Admit: 2018-11-09 | Discharge: 2018-11-09 | Disposition: A | Discharge status: Home / Self Care | Payer: Self-pay | Attending: Emergency Medicine | Admitting: Emergency Medicine

## 2018-11-09 ENCOUNTER — Other Ambulatory Visit: Payer: Self-pay

## 2018-11-09 DIAGNOSIS — R739 Hyperglycemia, unspecified: Secondary | ICD-10-CM | POA: Insufficient documentation

## 2018-11-09 DIAGNOSIS — Z5321 Procedure and treatment not carried out due to patient leaving prior to being seen by health care provider: Secondary | ICD-10-CM | POA: Insufficient documentation

## 2018-11-09 LAB — ETHANOL: Alcohol, Ethyl (B): 202 mg/dL — ABNORMAL HIGH (ref <10)

## 2018-11-09 LAB — I-STAT BETA HCG BLOOD, ED (MC, WL, AP ONLY): I-stat hCG, quantitative: <5 m[IU]/mL (ref <5)

## 2018-11-09 LAB — CBG MONITORING, ED: Glucose-Capillary: 111 mg/dL — ABNORMAL HIGH (ref 70–99)

## 2018-11-09 NOTE — ED Notes (Signed)
Verlin GrillsJermane Liera (boyfriend) [phone] will transport pt home at time of discharge

## 2018-11-09 NOTE — ED Notes (Signed)
Dr. Read Drivers notified that pt requesting to leave AMA.

## 2018-11-09 NOTE — ED Triage Notes (Signed)
Pt from home with family. Sister noted that pt was snoring and check pt's CBG and it was low. Pt reportedly took dose of insulin at bedtime without checking her cbg. EMS noted CBG to be 40 with they arrived. Pt has received D10 in field plus she has eaten 2 pb&j sandwiches and had a soda to drink. Per family pt has a baseline of "being belligerent". Pt with history of DM, seizures, ETOH and HTN.

## 2018-11-09 NOTE — ED Notes (Signed)
Bed: WA20 Expected date:  Expected time:  Means of arrival:  Comments: EMS-hypoglycemia 

## 2018-11-14 ENCOUNTER — Other Ambulatory Visit: Payer: Self-pay | Admitting: Family Medicine

## 2018-11-14 DIAGNOSIS — I10 Essential (primary) hypertension: Secondary | ICD-10-CM

## 2018-11-14 NOTE — Progress Notes (Signed)
Called, no answer. Left a message for patient to call back. Thanks!  

## 2018-11-14 NOTE — Progress Notes (Signed)
Renal artery order was incorrect

## 2018-11-15 ENCOUNTER — Ambulatory Visit (HOSPITAL_COMMUNITY): Payer: Self-pay

## 2018-11-18 ENCOUNTER — Ambulatory Visit (HOSPITAL_COMMUNITY): Payer: Self-pay | Attending: Family Medicine

## 2018-11-18 MED FILL — !HUMALOG MIX 75-25 KWIKPEN: (75-25) 100 | 15 days supply | Qty: 30 | Fill #3

## 2018-11-22 ENCOUNTER — Ambulatory Visit: Payer: Self-pay | Admitting: Family Medicine

## 2018-12-04 ENCOUNTER — Other Ambulatory Visit: Payer: Self-pay | Admitting: Family Medicine

## 2018-12-04 ENCOUNTER — Telehealth: Payer: Self-pay

## 2018-12-04 DIAGNOSIS — E119 Type 2 diabetes mellitus without complications: Secondary | ICD-10-CM

## 2018-12-04 DIAGNOSIS — Z794 Long term (current) use of insulin: Principal | ICD-10-CM

## 2018-12-04 MED FILL — $HUMALOG MIX 75-25 KWIKPEN: (75-25) 100 | 84 days supply | Qty: 180 | Fill #0

## 2018-12-04 NOTE — Telephone Encounter (Signed)
Called patient, she was asking for a refill on insulin. I advised that this has been filled this morning. Thanks!

## 2018-12-05 ENCOUNTER — Other Ambulatory Visit: Payer: Self-pay

## 2018-12-05 ENCOUNTER — Telehealth: Payer: Self-pay

## 2018-12-05 DIAGNOSIS — R0981 Nasal congestion: Secondary | ICD-10-CM

## 2018-12-05 DIAGNOSIS — I10 Essential (primary) hypertension: Secondary | ICD-10-CM

## 2018-12-05 DIAGNOSIS — J42 Unspecified chronic bronchitis: Secondary | ICD-10-CM

## 2018-12-05 DIAGNOSIS — R000 Tachycardia, unspecified: Secondary | ICD-10-CM

## 2018-12-05 MED ORDER — SAXAGLIPTIN HCL 2.5 MG PO TABS: 2.5000 mg | ORAL_TABLET | Freq: Every day | ORAL | 3 refills | Status: DC

## 2018-12-05 MED ORDER — FUROSEMIDE 40 MG PO TABS: 40.0000 mg | ORAL_TABLET | Freq: Every day | ORAL | 3 refills | Status: DC

## 2018-12-05 MED ORDER — METOPROLOL SUCCINATE ER 25 MG PO TB24: 12.5000 mg | ORAL_TABLET | Freq: Every day | ORAL | 5 refills | Status: DC

## 2018-12-05 MED ORDER — LOSARTAN POTASSIUM 100 MG PO TABS: 100.0000 mg | ORAL_TABLET | Freq: Every day | ORAL | 3 refills | Status: DC

## 2018-12-05 MED ORDER — FLUTICASONE PROPIONATE 50 MCG/ACT NA SUSP: 2.0000 | Freq: Every day | NASAL | 6 refills | Status: DC

## 2018-12-05 MED ORDER — POTASSIUM CHLORIDE ER 10 MEQ PO TBCR: 10.0000 meq | EXTENDED_RELEASE_TABLET | Freq: Every day | ORAL | 0 refills | Status: DC

## 2018-12-05 MED ORDER — ALBUTEROL SULFATE HFA 108 (90 BASE) MCG/ACT IN AERS: 2.0000 | INHALATION_SPRAY | Freq: Four times a day (QID) | RESPIRATORY_TRACT | 0 refills | Status: DC | PRN

## 2018-12-05 MED ORDER — AMLODIPINE BESYLATE 5 MG PO TABS: 5.0000 mg | ORAL_TABLET | Freq: Every day | ORAL | 3 refills | Status: DC

## 2018-12-05 NOTE — Telephone Encounter (Signed)
Refilled maintenance  medication. Sent into pharmacy. Thanks!

## 2018-12-06 ENCOUNTER — Encounter: Payer: Self-pay | Admitting: Family Medicine

## 2018-12-06 ENCOUNTER — Ambulatory Visit (INDEPENDENT_AMBULATORY_CARE_PROVIDER_SITE_OTHER): Payer: Self-pay | Admitting: Family Medicine

## 2018-12-06 VITALS — BP 168/70 | HR 87 | Temp 98.3°F | Resp 16 | Ht 64.0 in | Wt 272.0 lb

## 2018-12-06 DIAGNOSIS — Z794 Long term (current) use of insulin: Secondary | ICD-10-CM

## 2018-12-06 DIAGNOSIS — E119 Type 2 diabetes mellitus without complications: Secondary | ICD-10-CM

## 2018-12-06 DIAGNOSIS — I10 Essential (primary) hypertension: Secondary | ICD-10-CM

## 2018-12-06 LAB — POCT URINALYSIS DIPSTICK
Bilirubin, UA: NEGATIVE
Glucose, UA: POSITIVE — AB
Ketones, UA: NEGATIVE
Leukocytes, UA: NEGATIVE
Nitrite, UA: NEGATIVE
Protein, UA: NEGATIVE
Spec Grav, UA: 1.015 (ref 1.010–1.025)
Urobilinogen, UA: 0.2 E.U./dL
pH, UA: 5.5 (ref 5.0–8.0)

## 2018-12-06 LAB — POCT GLYCOSYLATED HEMOGLOBIN (HGB A1C): Hemoglobin A1C: 8 % — AB (ref 4.0–5.6)

## 2018-12-06 NOTE — Progress Notes (Signed)
  [facility]   Progress Note: General Provider: Mike Gip, FNP  SUBJECTIVE:   Jill Shaw is a 43 y.o. female who  has a past medical history of Anemia, Arthritis, Asthma, COPD (chronic obstructive pulmonary disease) (HCC), Diabetes mellitus without complication (HCC), GERD (gastroesophageal reflux disease), Hypertension, Neuromuscular disorder (HCC), Seizures (HCC), Sickle cell trait (HCC), and Smoker.. Patient presents today for Hypertension and Shoulder Pain (hurts to lift arms on both pain )   Patient states that she has difficulty with working due to low back pain and having weakness in bilateral knees. Patient states that she is unable to sit and stand for more than an hour at a time. She states that she continues to have swelling despite being on lasix. Patient states that she has neuropathy in her upper extremities. She states that she can barely lift her arm at night "feels like it is broken" due to bilateral shoulder pain. Patient states that she was seen in the past for her pain when in [place], [place] at [place]. Was told that her "arthritis will spread all over". Patient states that she would like to "have an MRI of my entire body". Patient states that she has a fear of enclosed spaces. Patient states that her symptoms started in 2014. Patient states that there was no reason that she can recall that started her pain.  Patient reports that her FBGs have been between 150-200s. Reports compliance with BP medications.    Review of Systems  Constitutional: Positive for malaise/fatigue.  HENT: Negative.   Eyes: Negative.   Respiratory: Positive for shortness of breath.   Cardiovascular: Positive for leg swelling.  Gastrointestinal: Negative.   Genitourinary: Negative.   Musculoskeletal: Positive for back pain, falls, joint pain and myalgias.  Skin: Negative.   Neurological: Negative.   Psychiatric/Behavioral: Negative.       OBJECTIVE: BP (!) 168/70   Pulse 87   Temp 98.3 F (36.8 C) (Oral)   Resp 16   Ht 5\' 4"  (1.626 m)   Wt 272 lb (123.4 kg)   LMP 11/10/2018   SpO2 100%   BMI 46.69 kg/m   Wt Readings from Last 3 Encounters:  12/06/18 272 lb (123.4 kg)  11/09/18 271 lb (122.9 kg)  11/08/18 271 lb (122.9 kg)     Physical Exam  ASSESSMENT/PLAN:   1. Essential hypertension Patient did not go to renal ultrasound and will reschedule on her own.  No medication changes today. The patient is asked to make an attempt to improve diet and exercise patterns to aid in medical management of this problem.  - Urinalysis Dipstick  2. Type 2 diabetes mellitus without complication, with long-term current use of insulin (HCC) - HgB A1c -8.0 will continue to monitor.    Return in about 3 months (around 03/06/2019), or if symptoms worsen or fail to improve, for htn.    The patient was given clear instructions to go to ER or return to medical center if symptoms do not improve, worsen or new problems develop. The patient verbalized understanding and agreed with plan of care.   [name]. Jill Lam, FNP-BC [facility] [address] [place], [place] [address] [phone]

## 2018-12-06 NOTE — Patient Instructions (Signed)

## 2018-12-09 ENCOUNTER — Other Ambulatory Visit: Payer: Self-pay

## 2018-12-09 DIAGNOSIS — I10 Essential (primary) hypertension: Secondary | ICD-10-CM

## 2018-12-09 MED ORDER — FUROSEMIDE 40 MG PO TABS: 40.0000 mg | ORAL_TABLET | Freq: Every day | ORAL | 3 refills | Status: DC

## 2018-12-11 ENCOUNTER — Telehealth: Payer: Self-pay

## 2018-12-11 NOTE — Telephone Encounter (Signed)
Called, no answer. Left a message for patient to call back. Thanks!  

## 2018-12-19 MED FILL — FUROSEMIDE 40 MG TAB: 40 | 30 days supply | Qty: 30 | Fill #0

## 2018-12-20 ENCOUNTER — Telehealth: Payer: Self-pay | Admitting: Family Medicine

## 2018-12-20 NOTE — Telephone Encounter (Signed)
Patient called, would like information regarding her lack of ability to stand or sit for long periods forwarded for Medicaid. Advised patient per Mike Gip to have Medicaid fax forms to our office and or patient can contact medical records for records.

## 2018-12-24 ENCOUNTER — Telehealth: Payer: Self-pay

## 2018-12-25 NOTE — Telephone Encounter (Signed)
Patient states that she is suppose to have a MRI order for leg and feet bilateral pain. Patient states that she spoke with you regarding this at last appointment.

## 2018-12-25 NOTE — Telephone Encounter (Signed)
Left a vm for patient to callback 

## 2018-12-26 NOTE — Telephone Encounter (Signed)
Please let patient know that she will need to have the cone discount in place prior to ordering the MRI. She said that she wanted to wait due to cost.

## 2018-12-27 ENCOUNTER — Telehealth (HOSPITAL_COMMUNITY): Payer: Self-pay

## 2018-12-27 NOTE — Telephone Encounter (Signed)
Patient called back and stated for Jill Shaw to go ahead and schedule her MRI starting with feet, heels, and legs as discussed.  She was informed the plan would cover the cost for her.

## 2018-12-27 NOTE — Telephone Encounter (Signed)
Message left for patient to call me back.

## 2018-12-27 NOTE — Telephone Encounter (Signed)
Spoke with patient regarding discount and scheduling MRI.  She stated previously she had the MRI completed and they paid for it after the fact when she completed her paperwork.  I stated we would want to make sure so that she did not wind up with the bill for the full cost.  She said she was going to call [facility] to see and then call me back,

## 2018-12-28 NOTE — Telephone Encounter (Signed)
Please let patient know that I can refer her to physical medicine for an evaluation of her pain and symptoms. At this time, the patient's findings do not warrant a full body MRI. This is a very expensive test that needs valid reasons for it to be performed. I am not stating that I do not believe that she is in pain, however, there are other ways to test for problems that are less costly in case something is not covered once she is approved for the discount. Physical medicine will be able to do a more thorough evaluation for her pain.

## 2018-12-31 NOTE — Telephone Encounter (Signed)
Message left for patient to call back [phone]

## 2019-01-06 ENCOUNTER — Ambulatory Visit: Payer: Self-pay

## 2019-01-13 ENCOUNTER — Telehealth: Payer: Self-pay

## 2019-01-14 MED FILL — AMLODIPINE BESYLATE 5 MG TA: 5 | 30 days supply | Qty: 30 | Fill #0

## 2019-01-14 NOTE — Telephone Encounter (Signed)
Left a vm for patient to callback 

## 2019-01-15 ENCOUNTER — Telehealth: Payer: Self-pay

## 2019-01-15 NOTE — Telephone Encounter (Signed)
Duplicate spoke with patient and sent message to provider

## 2019-01-15 NOTE — Telephone Encounter (Signed)
Patient states that she is having urine frequency and her urine is cloudy. Patient has been having symptoms for about 2 to 3 days and would like to have a antibiotic sent to pharmacy.  Patient is also wanting a refill on Zanaflex. Please advise

## 2019-01-15 NOTE — Telephone Encounter (Signed)
Left a vm for patient to callback 

## 2019-01-16 ENCOUNTER — Ambulatory Visit (INDEPENDENT_AMBULATORY_CARE_PROVIDER_SITE_OTHER): Payer: Self-pay | Admitting: Family Medicine

## 2019-01-16 ENCOUNTER — Other Ambulatory Visit: Payer: Self-pay

## 2019-01-16 ENCOUNTER — Encounter: Payer: Self-pay | Admitting: Family Medicine

## 2019-01-16 DIAGNOSIS — M25569 Pain in unspecified knee: Secondary | ICD-10-CM

## 2019-01-16 DIAGNOSIS — M129 Arthropathy, unspecified: Secondary | ICD-10-CM

## 2019-01-16 DIAGNOSIS — G8929 Other chronic pain: Secondary | ICD-10-CM

## 2019-01-16 MED ORDER — NITROFURANTOIN MONOHYD MACRO 100 MG PO CAPS: 100.0000 mg | ORAL_CAPSULE | Freq: Two times a day (BID) | ORAL | 0 refills | Status: AC

## 2019-01-16 MED FILL — NITROFURANTOIN MONO-MCR 100: 100 | 7 days supply | Qty: 14 | Fill #0

## 2019-01-16 MED FILL — AMLODIPINE BESYLATE 5 MG TA: 5 | 30 days supply | Qty: 30 | Fill #1

## 2019-01-16 NOTE — Progress Notes (Signed)
  [facility]  Virtual Visit via Telephone Note  I connected with Jill Shaw on 01/16/19 at  1:40 PM EDT by telephone and verified that I am speaking with the correct person using two identifiers.   I discussed the limitations, risks, security and privacy concerns of performing an evaluation and management service by telephone and the availability of in person appointments. I also discussed with the patient that there may be a patient responsible charge related to this service. The patient expressed understanding and agreed to proceed.   History of Present Illness: Patient with a history of back and knee pain. She states that she has been on zanaflex for this in the past. She is requesting a refill of this medication. We have discussed Patient is also requesting a full body MRI due to pain. She had a CT in the ED on 10/2018 that was normal. Patient does not have a current orange card or [place] discount.     Observations/Objective: Patient with regular voice tone, rate and rhythm. Speaking calmly and is in no apparent distress.    Assessment and Plan: 1. Chronic knee pain, unspecified laterality  2. Arthritis/arthropathy of multiple joints  Discussed that patient will need to have orange card and cone discount in place prior to placing order for MRI. Discussed that zanaflex will not be prescribed at the present time due to potential adverse effects from the long-term use of skeletal muscle relaxants include sedation, addictive potential, and hepatotoxicity. Patient advised that she can be referred to physical medicine for further evaluation once she has discounts in place. Patient states that was told that she could have the MRI and then the orange card will be retroactive. I explained that she will be responsible if the is denied. She states that she will call [facility] and ask for an appt.      Follow Up Instructions: We discussed hand  washing, using hand sanitizer when soap and water are not available, only going out when absolutely necessary, and social distancing.     I discussed the assessment and treatment plan with the patient. The patient was provided an opportunity to ask questions and all were answered. The patient agreed with the plan and demonstrated an understanding of the instructions.   The patient was advised to call back or seek an in-person evaluation if the symptoms worsen or if the condition fails to improve as anticipated.  I provided 25 minutes of non-face-to-face time during this encounter.  Ms. Andr L. [name], FNP-BC [facility] [address] [place], [place] [address] [phone]

## 2019-01-16 NOTE — Telephone Encounter (Signed)
Patient notified

## 2019-01-16 NOTE — Telephone Encounter (Signed)
Left another vm for patient to callback  

## 2019-01-16 NOTE — Telephone Encounter (Signed)
Left a vm for patient to callback 

## 2019-01-20 ENCOUNTER — Ambulatory Visit: Payer: Self-pay

## 2019-01-29 ENCOUNTER — Encounter: Payer: Self-pay | Admitting: Family Medicine

## 2019-01-29 ENCOUNTER — Other Ambulatory Visit: Payer: Self-pay

## 2019-02-04 ENCOUNTER — Telehealth: Payer: Self-pay | Admitting: Family Medicine

## 2019-02-04 NOTE — Telephone Encounter (Signed)
Patient reports that she had a mild fall this evening while climbing up her steps. She states that she bruised her right knee on the steps. She is inquiring about possible MRI because of chronic weakness in her knees. Patient did not acquire any other injuries from fall. She will follow up with her Rosana Hoes, NP at on tomorrow 02/05/2019 for further evaluation of her knee injury.

## 2019-02-05 ENCOUNTER — Other Ambulatory Visit: Payer: Self-pay

## 2019-02-05 ENCOUNTER — Ambulatory Visit (INDEPENDENT_AMBULATORY_CARE_PROVIDER_SITE_OTHER): Payer: Self-pay | Admitting: Family Medicine

## 2019-02-05 ENCOUNTER — Encounter: Payer: Self-pay | Admitting: Family Medicine

## 2019-02-05 VITALS — BP 166/80 | HR 113 | Temp 98.8°F | Resp 18 | Ht 64.0 in | Wt 277.0 lb

## 2019-02-05 DIAGNOSIS — I10 Essential (primary) hypertension: Secondary | ICD-10-CM

## 2019-02-05 DIAGNOSIS — G44029 Chronic cluster headache, not intractable: Secondary | ICD-10-CM

## 2019-02-05 DIAGNOSIS — J449 Chronic obstructive pulmonary disease, unspecified: Secondary | ICD-10-CM | POA: Insufficient documentation

## 2019-02-05 DIAGNOSIS — R569 Unspecified convulsions: Secondary | ICD-10-CM

## 2019-02-05 MED ORDER — CLONIDINE HCL 0.1 MG PO TABS
0.2000 mg | ORAL_TABLET | Freq: Once | ORAL | Status: AC
  Administered 2019-02-05: 0.2 mg via ORAL

## 2019-02-05 NOTE — Progress Notes (Signed)
[facility]   Progress Note: Sick Visit Provider: Mike Gip, FNP  SUBJECTIVE:   Jill Shaw is a 43 y.o. female who  has a past medical history of Anemia, Arthritis, Asthma, COPD (chronic obstructive pulmonary disease) (HCC), Diabetes mellitus without complication (HCC), GERD (gastroesophageal reflux disease), Hypertension, Neuromuscular disorder (HCC), Seizures (HCC), Sickle cell trait (HCC), and Smoker.. Patient presents today for Leg Pain (fell x 1 day ago and injured right lower leg, red and swelling to area )  Patient states that she is here because she would like an MRI. She states that she would like to have a total body MRI due to not know what is wrong. She states that she has numbness and tingling and that yesterday her knees "gave out" and she fell. She injured her right lower leg and is requesting an antibiotic. Describes her leg pain as constant and 8/10.  Patient did not take her antihypertensive medication today. She denies chest pain, sob, or dizziness.  She states a history of seizures that are related to her blood sugars and stress. Has not had a seizure in over 4 months.  Review of Systems  Constitutional: Negative.   HENT: Negative.   Eyes: Negative.   Respiratory: Negative.   Cardiovascular: Negative.   Gastrointestinal: Negative.   Genitourinary: Negative.   Musculoskeletal: Positive for back pain, falls, joint pain and myalgias.  Skin: Negative.   Neurological: Negative.   Psychiatric/Behavioral: Negative.      OBJECTIVE: BP (!) 176/86 (BP Location: Right Arm, Patient Position: Sitting, Cuff Size: Large) Comment: manually  Pulse (!) 113   Temp 98.8 F (37.1 C) (Oral)   Resp 18   Ht 5\' 4"  (1.626 m)   Wt 277 lb (125.6 kg)   LMP 02/03/2019   SpO2 99%   BMI 47.55 kg/m   Wt Readings from Last 3 Encounters:  02/05/19 277 lb (125.6 kg)  12/06/18 272 lb (123.4 kg)  11/09/18 271 lb (122.9 kg)      Physical Exam Vitals signs and nursing note reviewed.  Constitutional:      General: She is not in acute distress.    Appearance: Normal appearance.  HENT:     Head: Normocephalic and atraumatic.  Eyes:     Extraocular Movements: Extraocular movements intact.     Conjunctiva/sclera: Conjunctivae normal.     Pupils: Pupils are equal, round, and reactive to light.  Cardiovascular:     Rate and Rhythm: Normal rate and regular rhythm.     Heart sounds: No murmur.  Pulmonary:     Effort: Pulmonary effort is normal.     Breath sounds: Normal breath sounds.  Musculoskeletal: Normal range of motion.  Skin:    General: Skin is warm and dry.     Comments: Please see photo of right lower extremity.  Neurological:     Mental Status: She is alert and oriented to person, place, and time.  Psychiatric:        Mood and Affect: Mood normal.        Behavior: Behavior normal.        Thought Content: Thought content normal.        Judgment: Judgment normal.        ASSESSMENT/PLAN: 1. Morbid obesity (HCC)  2. Chronic obstructive pulmonary disease, unspecified COPD type (HCC)  3. Seizure (HCC)  4. Chronic cluster headache, not intractable - MR Brain Wo Contrast; Future  5. Essential hypertension -  cloNIDine (CATAPRES) tablet 0.2 mg   Patient is requesting further imaging. Discussed starting with the brain due to a history of seizures. Encourages smoking cessation, weight loss and medication compliance.        The patient was given clear instructions to go to ER or return to medical center if symptoms do not improve, worsen or new problems develop. The patient verbalized understanding and agreed with plan of care.   Ms. [name], FNP-BC [facility] [address]  [place], [place] [id number] [phone]     This note has been created with Dragon speech recognition software and smart phrase technology. Any transcriptional errors are  unintentional.

## 2019-02-05 NOTE — Patient Instructions (Signed)
DASH Eating Plan  DASH stands for "Dietary Approaches to Stop Hypertension." The DASH eating plan is a healthy eating plan that has been shown to reduce high blood pressure (hypertension). It may also reduce your risk for type 2 diabetes, heart disease, and stroke. The DASH eating plan may also help with weight loss.  What are tips for following this plan?    General guidelines   Avoid eating more than 2,300 mg (milligrams) of salt (sodium) a day. If you have hypertension, you may need to reduce your sodium intake to 1,500 mg a day.   Limit alcohol intake to no more than 1 drink a day for nonpregnant women and 2 drinks a day for men. One drink equals 12 oz of beer, 5 oz of wine, or 1 oz of hard liquor.   Work with your health care provider to maintain a healthy body weight or to lose weight. Ask what an ideal weight is for you.   Get at least 30 minutes of exercise that causes your heart to beat faster (aerobic exercise) most days of the week. Activities may include walking, swimming, or biking.   Work with your health care provider or diet and nutrition specialist (dietitian) to adjust your eating plan to your individual calorie needs.  Reading food labels     Check food labels for the amount of sodium per serving. Choose foods with less than 5 percent of the Daily Value of sodium. Generally, foods with less than 300 mg of sodium per serving fit into this eating plan.   To find whole grains, look for the word "whole" as the first word in the ingredient list.  Shopping   Buy products labeled as "low-sodium" or "no salt added."   Buy fresh foods. Avoid canned foods and premade or frozen meals.  Cooking   Avoid adding salt when cooking. Use salt-free seasonings or herbs instead of table salt or sea salt. Check with your health care provider or pharmacist before using salt substitutes.   Do not fry foods. Cook foods using healthy methods such as baking, boiling, grilling, and broiling instead.   Cook with  heart-healthy oils, such as olive, canola, soybean, or sunflower oil.  Meal planning   Eat a balanced diet that includes:  ? 5 or more servings of fruits and vegetables each day. At each meal, try to fill half of your plate with fruits and vegetables.  ? Up to 6-8 servings of whole grains each day.  ? Less than 6 oz of lean meat, poultry, or fish each day. A 3-oz serving of meat is about the same size as a deck of cards. One egg equals 1 oz.  ? 2 servings of low-fat dairy each day.  ? A serving of nuts, seeds, or beans 5 times each week.  ? Heart-healthy fats. Healthy fats called Omega-3 fatty acids are found in foods such as flaxseeds and coldwater fish, like sardines, salmon, and mackerel.   Limit how much you eat of the following:  ? Canned or prepackaged foods.  ? Food that is high in trans fat, such as fried foods.  ? Food that is high in saturated fat, such as fatty meat.  ? Sweets, desserts, sugary drinks, and other foods with added sugar.  ? Full-fat dairy products.   Do not salt foods before eating.   Try to eat at least 2 vegetarian meals each week.   Eat more home-cooked food and less restaurant, buffet, and fast food.     When eating at a restaurant, ask that your food be prepared with less salt or no salt, if possible.  What foods are recommended?  The items listed may not be a complete list. Talk with your dietitian about what dietary choices are best for you.  Grains  Whole-grain or whole-wheat bread. Whole-grain or whole-wheat pasta. Brown rice. Oatmeal. Quinoa. Bulgur. Whole-grain and low-sodium cereals. Pita bread. Low-fat, low-sodium crackers. Whole-wheat flour tortillas.  Vegetables  Fresh or frozen vegetables (raw, steamed, roasted, or grilled). Low-sodium or reduced-sodium tomato and vegetable juice. Low-sodium or reduced-sodium tomato sauce and tomato paste. Low-sodium or reduced-sodium canned vegetables.  Fruits  All fresh, dried, or frozen fruit. Canned fruit in natural juice (without  added sugar).  Meat and other protein foods  Skinless chicken or turkey. Ground chicken or turkey. Pork with fat trimmed off. Fish and seafood. Egg whites. Dried beans, peas, or lentils. Unsalted nuts, nut butters, and seeds. Unsalted canned beans. Lean cuts of beef with fat trimmed off. Low-sodium, lean deli meat.  Dairy  Low-fat (1%) or fat-free (skim) milk. Fat-free, low-fat, or reduced-fat cheeses. Nonfat, low-sodium ricotta or cottage cheese. Low-fat or nonfat yogurt. Low-fat, low-sodium cheese.  Fats and oils  Soft margarine without trans fats. Vegetable oil. Low-fat, reduced-fat, or light mayonnaise and salad dressings (reduced-sodium). Canola, safflower, olive, soybean, and sunflower oils. Avocado.  Seasoning and other foods  Herbs. Spices. Seasoning mixes without salt. Unsalted popcorn and pretzels. Fat-free sweets.  What foods are not recommended?  The items listed may not be a complete list. Talk with your dietitian about what dietary choices are best for you.  Grains  Baked goods made with fat, such as croissants, muffins, or some breads. Dry pasta or rice meal packs.  Vegetables  Creamed or fried vegetables. Vegetables in a cheese sauce. Regular canned vegetables (not low-sodium or reduced-sodium). Regular canned tomato sauce and paste (not low-sodium or reduced-sodium). Regular tomato and vegetable juice (not low-sodium or reduced-sodium). Pickles. Olives.  Fruits  Canned fruit in a light or heavy syrup. Fried fruit. Fruit in cream or butter sauce.  Meat and other protein foods  Fatty cuts of meat. Ribs. Fried meat. Bacon. Sausage. Bologna and other processed lunch meats. Salami. Fatback. Hotdogs. Bratwurst. Salted nuts and seeds. Canned beans with added salt. Canned or smoked fish. Whole eggs or egg yolks. Chicken or turkey with skin.  Dairy  Whole or 2% milk, cream, and half-and-half. Whole or full-fat cream cheese. Whole-fat or sweetened yogurt. Full-fat cheese. Nondairy creamers. Whipped toppings.  Processed cheese and cheese spreads.  Fats and oils  Butter. Stick margarine. Lard. Shortening. Ghee. Bacon fat. Tropical oils, such as coconut, palm kernel, or palm oil.  Seasoning and other foods  Salted popcorn and pretzels. Onion salt, garlic salt, seasoned salt, table salt, and sea salt. Worcestershire sauce. Tartar sauce. Barbecue sauce. Teriyaki sauce. Soy sauce, including reduced-sodium. Steak sauce. Canned and packaged gravies. Fish sauce. Oyster sauce. Cocktail sauce. Horseradish that you find on the shelf. Ketchup. Mustard. Meat flavorings and tenderizers. Bouillon cubes. Hot sauce and Tabasco sauce. Premade or packaged marinades. Premade or packaged taco seasonings. Relishes. Regular salad dressings.  Where to find more information:   National Heart, Lung, and Blood Institute: www.nhlbi.nih.gov   American Heart Association: www.heart.org  Summary   The DASH eating plan is a healthy eating plan that has been shown to reduce high blood pressure (hypertension). It may also reduce your risk for type 2 diabetes, heart disease, and stroke.   With the   DASH eating plan, you should limit salt (sodium) intake to 2,300 mg a day. If you have hypertension, you may need to reduce your sodium intake to 1,500 mg a day.   When on the DASH eating plan, aim to eat more fresh fruits and vegetables, whole grains, lean proteins, low-fat dairy, and heart-healthy fats.   Work with your health care provider or diet and nutrition specialist (dietitian) to adjust your eating plan to your individual calorie needs.  This information is not intended to replace advice given to you by your health care provider. Make sure you discuss any questions you have with your health care provider.  Document Released: 09/21/2011 Document Revised: 09/25/2016 Document Reviewed: 09/25/2016  Elsevier Interactive Patient Education  2019 Elsevier Inc.

## 2019-02-07 ENCOUNTER — Observation Stay (HOSPITAL_COMMUNITY)
Admission: EM | Admit: 2019-02-07 | Discharge: 2019-02-07 | Disposition: A | Discharge status: Home / Self Care | Payer: Self-pay | Attending: Internal Medicine | Admitting: Internal Medicine

## 2019-02-07 ENCOUNTER — Emergency Department (HOSPITAL_COMMUNITY): Payer: Self-pay

## 2019-02-07 ENCOUNTER — Other Ambulatory Visit: Payer: Self-pay

## 2019-02-07 ENCOUNTER — Encounter (HOSPITAL_COMMUNITY): Payer: Self-pay | Admitting: Emergency Medicine

## 2019-02-07 ENCOUNTER — Observation Stay (HOSPITAL_COMMUNITY): Payer: Self-pay

## 2019-02-07 DIAGNOSIS — Z79899 Other long term (current) drug therapy: Secondary | ICD-10-CM | POA: Insufficient documentation

## 2019-02-07 DIAGNOSIS — Z6841 Body Mass Index (BMI) 40.0 and over, adult: Secondary | ICD-10-CM | POA: Insufficient documentation

## 2019-02-07 DIAGNOSIS — R569 Unspecified convulsions: Secondary | ICD-10-CM | POA: Insufficient documentation

## 2019-02-07 DIAGNOSIS — Z888 Allergy status to other drugs, medicaments and biological substances status: Secondary | ICD-10-CM | POA: Insufficient documentation

## 2019-02-07 DIAGNOSIS — E11649 Type 2 diabetes mellitus with hypoglycemia without coma: Principal | ICD-10-CM | POA: Insufficient documentation

## 2019-02-07 DIAGNOSIS — J449 Chronic obstructive pulmonary disease, unspecified: Secondary | ICD-10-CM | POA: Insufficient documentation

## 2019-02-07 DIAGNOSIS — Z882 Allergy status to sulfonamides status: Secondary | ICD-10-CM | POA: Insufficient documentation

## 2019-02-07 DIAGNOSIS — E872 Acidosis, unspecified: Secondary | ICD-10-CM | POA: Diagnosis present

## 2019-02-07 DIAGNOSIS — D573 Sickle-cell trait: Secondary | ICD-10-CM | POA: Insufficient documentation

## 2019-02-07 DIAGNOSIS — E66813 Obesity, class 3: Secondary | ICD-10-CM | POA: Diagnosis present

## 2019-02-07 DIAGNOSIS — D649 Anemia, unspecified: Secondary | ICD-10-CM

## 2019-02-07 DIAGNOSIS — Z8249 Family history of ischemic heart disease and other diseases of the circulatory system: Secondary | ICD-10-CM | POA: Insufficient documentation

## 2019-02-07 DIAGNOSIS — Z886 Allergy status to analgesic agent status: Secondary | ICD-10-CM | POA: Insufficient documentation

## 2019-02-07 DIAGNOSIS — K219 Gastro-esophageal reflux disease without esophagitis: Secondary | ICD-10-CM | POA: Insufficient documentation

## 2019-02-07 DIAGNOSIS — F17210 Nicotine dependence, cigarettes, uncomplicated: Secondary | ICD-10-CM | POA: Insufficient documentation

## 2019-02-07 DIAGNOSIS — E1140 Type 2 diabetes mellitus with diabetic neuropathy, unspecified: Secondary | ICD-10-CM | POA: Insufficient documentation

## 2019-02-07 DIAGNOSIS — M25571 Pain in right ankle and joints of right foot: Secondary | ICD-10-CM | POA: Insufficient documentation

## 2019-02-07 DIAGNOSIS — Z833 Family history of diabetes mellitus: Secondary | ICD-10-CM | POA: Insufficient documentation

## 2019-02-07 DIAGNOSIS — W19XXXA Unspecified fall, initial encounter: Secondary | ICD-10-CM

## 2019-02-07 DIAGNOSIS — Z794 Long term (current) use of insulin: Secondary | ICD-10-CM | POA: Insufficient documentation

## 2019-02-07 DIAGNOSIS — Z7951 Long term (current) use of inhaled steroids: Secondary | ICD-10-CM | POA: Insufficient documentation

## 2019-02-07 DIAGNOSIS — I959 Hypotension, unspecified: Secondary | ICD-10-CM | POA: Insufficient documentation

## 2019-02-07 DIAGNOSIS — D638 Anemia in other chronic diseases classified elsewhere: Secondary | ICD-10-CM | POA: Insufficient documentation

## 2019-02-07 DIAGNOSIS — E162 Hypoglycemia, unspecified: Secondary | ICD-10-CM | POA: Diagnosis present

## 2019-02-07 DIAGNOSIS — I10 Essential (primary) hypertension: Secondary | ICD-10-CM | POA: Insufficient documentation

## 2019-02-07 LAB — CBC WITH DIFFERENTIAL/PLATELET
Abs Immature Granulocytes: 0.03 10*3/uL (ref 0.00–0.07)
Basophils Absolute: 0.1 10*3/uL (ref 0.0–0.1)
Basophils Relative: 1 %
Eosinophils Absolute: 0.2 10*3/uL (ref 0.0–0.5)
Eosinophils Relative: 2 %
HCT: 25.5 % — ABNORMAL LOW (ref 36.0–46.0)
Hemoglobin: 7.4 g/dL — ABNORMAL LOW (ref 12.0–15.0)
Immature Granulocytes: 0 %
Lymphocytes Relative: 21 %
Lymphs Abs: 1.7 10*3/uL (ref 0.7–4.0)
MCH: 20.9 pg — ABNORMAL LOW (ref 26.0–34.0)
MCHC: 29 g/dL — ABNORMAL LOW (ref 30.0–36.0)
MCV: 72 fL — ABNORMAL LOW (ref 80.0–100.0)
Monocytes Absolute: 0.6 10*3/uL (ref 0.1–1.0)
Monocytes Relative: 7 %
Neutro Abs: 5.5 10*3/uL (ref 1.7–7.7)
Neutrophils Relative %: 69 %
Platelets: 225 10*3/uL (ref 150–400)
RBC: 3.54 MIL/uL — ABNORMAL LOW (ref 3.87–5.11)
RDW: 22.8 % — ABNORMAL HIGH (ref 11.5–15.5)
WBC: 8 10*3/uL (ref 4.0–10.5)
nRBC: 0.2 % (ref 0.0–0.2)

## 2019-02-07 LAB — BASIC METABOLIC PANEL
Anion gap: 14 (ref 5–15)
BUN: 8 mg/dL (ref 6–20)
CO2: 17 mmol/L — ABNORMAL LOW (ref 22–32)
Calcium: 8.7 mg/dL — ABNORMAL LOW (ref 8.9–10.3)
Chloride: 110 mmol/L (ref 98–111)
Creatinine, Ser: 1.34 mg/dL — ABNORMAL HIGH (ref 0.44–1.00)
GFR calc Af Amer: 56 mL/min — ABNORMAL LOW (ref >60)
GFR calc non Af Amer: 49 mL/min — ABNORMAL LOW (ref >60)
Glucose, Bld: 75 mg/dL (ref 70–99)
Potassium: 3.6 mmol/L (ref 3.5–5.1)
Sodium: 141 mmol/L (ref 135–145)

## 2019-02-07 LAB — CBG MONITORING, ED
Glucose-Capillary: 88 mg/dL (ref 70–99)
Glucose-Capillary: 63 mg/dL — ABNORMAL LOW (ref 70–99)
Glucose-Capillary: 170 mg/dL — ABNORMAL HIGH (ref 70–99)
Glucose-Capillary: 236 mg/dL — ABNORMAL HIGH (ref 70–99)

## 2019-02-07 LAB — URINALYSIS, MICROSCOPIC (REFLEX)

## 2019-02-07 LAB — RAPID URINE DRUG SCREEN, HOSP PERFORMED
Amphetamines: NOT DETECTED
Barbiturates: NOT DETECTED
Benzodiazepines: NOT DETECTED
Cocaine: NOT DETECTED
Opiates: NOT DETECTED
Tetrahydrocannabinol: NOT DETECTED

## 2019-02-07 LAB — SALICYLATE LEVEL: Salicylate Lvl: <7 mg/dL (ref 2.8–30.0)

## 2019-02-07 LAB — ETHANOL: Alcohol, Ethyl (B): 148 mg/dL — ABNORMAL HIGH (ref <10)

## 2019-02-07 LAB — URINALYSIS, ROUTINE W REFLEX MICROSCOPIC
Bilirubin Urine: NEGATIVE
Glucose, UA: 250 mg/dL — AB
Ketones, ur: 15 mg/dL — AB
Leukocytes,Ua: NEGATIVE
Nitrite: NEGATIVE
Protein, ur: NEGATIVE mg/dL
Specific Gravity, Urine: 1.02 (ref 1.005–1.030)
pH: 5 (ref 5.0–8.0)

## 2019-02-07 LAB — LACTIC ACID, PLASMA
Lactic Acid, Venous: 4 mmol/L (ref 0.5–1.9)
Lactic Acid, Venous: 2.8 mmol/L (ref 0.5–1.9)

## 2019-02-07 LAB — I-STAT BETA HCG BLOOD, ED (MC, WL, AP ONLY): I-stat hCG, quantitative: <5 m[IU]/mL (ref <5)

## 2019-02-07 LAB — TROPONIN I: Troponin I: <0.03 ng/mL (ref <0.03)

## 2019-02-07 LAB — ACETAMINOPHEN LEVEL: Acetaminophen (Tylenol), Serum: <10 ug/mL — ABNORMAL LOW (ref 10–30)

## 2019-02-07 MED ORDER — DEXTROSE-NACL 5-0.45 % IV SOLN
INTRAVENOUS | Status: DC
  Administered 2019-02-07: 08:00:00 via INTRAVENOUS

## 2019-02-07 MED ORDER — SODIUM CHLORIDE 0.9 % IV BOLUS
1000.0000 mL | Freq: Once | INTRAVENOUS | Status: AC
  Administered 2019-02-07: 1000 mL via INTRAVENOUS

## 2019-02-07 MED ORDER — SODIUM CHLORIDE 0.9 % IV BOLUS
500.0000 mL | Freq: Once | INTRAVENOUS | Status: AC
  Administered 2019-02-07: 500 mL via INTRAVENOUS

## 2019-02-07 MED ORDER — DEXTROSE 50 % IV SOLN
1.0000 | Freq: Once | INTRAVENOUS | Status: AC
  Administered 2019-02-07: 08:00:00 50 mL via INTRAVENOUS
  Filled 2019-02-07: qty 50

## 2019-02-07 NOTE — ED Triage Notes (Signed)
BIB EMS from home. EMS called for AMS. CBG 57 and initial BP 80/40 for EMS. Hx of DM with non-compliance. Given D10 and ~163mL NS en route by EMS with mild improvement of mental status. Pt remains GCS 10 upon arrival in ED.

## 2019-02-07 NOTE — ED Provider Notes (Signed)
[facility] EMERGENCY DEPARTMENT Provider Note   CSN: [id number] Arrival date & time:       History   Chief Complaint Chief Complaint  Patient presents with  . Altered Mental Status  . Hypoglycemia  . Hypotension    HPI Jill Shaw is a 43 y.o. female.     Patient brought to the emergency department from home by EMS.  Patient's family called EMS when they checked on her this morning and could not wake her up.  EMS report that she was unresponsive upon their arrival, blood sugar was 50.  She was administered dextrose and has woken up some.  EMS report that she was hypotensive for them.  The EMS team report that patient had multiple insulin pens in her room and no one at the house seemed to know which ones she was using.  Patient still confused at arrival, unable to answer questions. Level V Caveat due to Mental status change.     Past Medical History:  Diagnosis Date  . Anemia   . Arthritis    knees, hands  . Asthma   . COPD (chronic obstructive pulmonary disease) ([place])   . Diabetes mellitus without complication ([place])    type 2  . GERD (gastroesophageal reflux disease)   . Hypertension   . Neuromuscular disorder (H[facility])    neuropathy feet  . Seizures ([place])    pt states r/t stress and blood sugar - no meds last one 4 months ago, not seen neurologist  . Sickle cell trait ([place])   . Smoker     Patient Active Problem List   Diagnosis Date Noted  . Chronic obstructive pulmonary disease ([place]) 02/05/2019  . Seizure ([place]) 07/31/2018  . Menorrhagia with irregular cycle 05/17/2017  . Anemia of chronic disease 04/06/2017  . Knee pain, chronic 03/27/2016  . Type 2 diabetes mellitus without complication, with long-term current use of insulin ([place]) 03/27/2016  . Essential hypertension 03/27/2016  . Morbid obesity ([place]) 03/27/2016  . Irritable bowel syndrome with constipation 03/27/2016  . Tobacco dependence 03/27/2016    Past Surgical History:   Procedure Laterality Date  . CESAREAN SECTION     x 1  . EYE SURGERY Bilateral    laser right and cataract removed left eye  . UPPER GI ENDOSCOPY  07/2017     OB History   No obstetric history on file.      Home Medications    Prior to Admission medications   Medication Sig Start Date End Date Taking? Authorizing Provider  albuterol (PROVENTIL HFA;VENTOLIN HFA) 108 (90 Base) MCG/ACT inhaler Inhale 2 puffs into the lungs every 6 (six) hours as needed for wheezing or shortness of breath. 12/05/18   Lanae Boast, FNP  albuterol (PROVENTIL) (2.5 MG/3ML) 0.083% nebulizer solution Take 3 mLs (2.5 mg total) by nebulization every 6 (six) hours as needed for wheezing or shortness of breath. 07/31/18   Lanae Boast, FNP  amLODipine (NORVASC) 5 MG tablet Take 1 tablet (5 mg total) by mouth daily. 12/05/18   Lanae Boast, FNP  Blood Glucose Monitoring Suppl (TRUE METRIX METER) w/Device KIT 1 each by Does not apply route 4 (four) times daily -  before meals and at bedtime. 01/21/18   Dorena Dew, FNP  budesonide-formoterol (SYMBICORT) 160-4.5 MCG/ACT inhaler Inhale 2 puffs into the lungs 2 (two) times daily. 01/07/18   Dorena Dew, FNP  cholecalciferol (VITAMIN D) 1000 units tablet Take 1,000 Units by mouth daily.  [provider]  ferrous sulfate 300 (60 Fe) MG/5ML syrup Take 5 mLs (300 mg total) by mouth daily. 10/23/18   Lanae Boast, FNP  fluticasone (FLONASE) 50 MCG/ACT nasal spray Place 2 sprays into both nostrils daily. 12/05/18   Lanae Boast, FNP  furosemide (LASIX) 40 MG tablet Take 1 tablet (40 mg total) by mouth daily. 12/09/18   Lanae Boast, FNP  glucose blood (TRUE METRIX BLOOD GLUCOSE TEST) test strip Use as instructed 01/21/18   Dorena Dew, FNP  Insulin Lispro Prot & Lispro (HUMALOG MIX 75/25 KWIKPEN) (75-25) 100 UNIT/ML Kwikpen INJECT 100 UNITS EVERY 12 HOURS 12/04/18   Lanae Boast, FNP  losartan (COZAAR) 100 MG tablet Take 1 tablet (100 mg total)  by mouth daily. 12/05/18   Lanae Boast, FNP  metoprolol succinate (TOPROL-XL) 25 MG 24 hr tablet Take 0.5 tablets (12.5 mg total) by mouth daily. 12/05/18   Lanae Boast, FNP  omeprazole (PRILOSEC) 40 MG capsule Take 1 capsule (40 mg total) by mouth daily. 12/20/17   Dorena Dew, FNP  potassium chloride (K-DUR) 10 MEQ tablet Take 1 tablet (10 mEq total) by mouth daily. 12/05/18   Lanae Boast, FNP  pregabalin (LYRICA) 75 MG capsule Take 1 capsule (75 mg total) by mouth 2 (two) times daily. 10/18/17   Charlott Rakes, MD  saxagliptin HCl (ONGLYZA) 2.5 MG TABS tablet Take 1 tablet (2.5 mg total) by mouth daily. 12/05/18   Lanae Boast, FNP    Family History Family History  Problem Relation Age of Onset  . Diabetes Mother   . Hypertension Mother     Social History Social History   Tobacco Use  . Smoking status: Current Every Day Smoker    Packs/day: 0.25    Years: 15.00    Pack years: 3.75    Types: Cigarettes  . Smokeless tobacco: Never Used  Substance Use Topics  . Alcohol use: No  . Drug use: No     Allergies   Ketoprofen; Aspirin; Cortizone-10 [hydrocortisone]; Gabapentin; Ibuprofen; Liraglutide; Naproxen; Omeprazole-sodium bicarbonate; Other; Sulfa antibiotics; and Tramadol   Review of Systems Review of Systems  Unable to perform ROS: Mental status change     Physical Exam Updated Vital Signs BP (!) 93/41 (BP Location: Left Arm)   Pulse 81   Temp 98.2 F (36.8 C) (Oral)   Resp (!) 25   Ht _0  (1.626 m)   Wt 126 kg   LMP 02/03/2019   SpO2 100%   BMI 47.68 kg/m   Physical Exam Constitutional:      Appearance: She is diaphoretic.  HENT:     Head: Atraumatic.  Neck:     Musculoskeletal: Neck supple.  Cardiovascular:     Rate and Rhythm: Normal rate and regular rhythm.     Pulses: Normal pulses.     Heart sounds: Normal heart sounds.  Pulmonary:     Effort: Pulmonary effort is normal.     Breath sounds: Normal breath sounds.  Abdominal:      Palpations: Abdomen is soft.  Musculoskeletal: Normal range of motion.        General: No swelling or deformity.  Skin:    General: Skin is warm.     Capillary Refill: Capillary refill takes less than 2 seconds.  Neurological:     General: No focal deficit present.     GCS: GCS eye subscore is 4. GCS verbal subscore is 4. GCS motor subscore is 6.     Cranial Nerves: Cranial nerves  are intact.     Sensory: Sensation is intact.     Motor: Motor function is intact.      ED Treatments / Results  Labs (all labs ordered are listed, but only abnormal results are displayed) Labs Reviewed  CBC WITH DIFFERENTIAL/PLATELET - Abnormal; Notable for the following components:      Result Value   RBC 3.54 (*)    Hemoglobin 7.4 (*)    HCT 25.5 (*)    MCV 72.0 (*)    MCH 20.9 (*)    MCHC 29.0 (*)    RDW 22.8 (*)    All other components within normal limits  BASIC METABOLIC PANEL - Abnormal; Notable for the following components:   CO2 17 (*)    Creatinine, Ser 1.34 (*)    Calcium 8.7 (*)    GFR calc non Af Amer 49 (*)    GFR calc Af Amer 56 (*)    All other components within normal limits  LACTIC ACID, PLASMA - Abnormal; Notable for the following components:   Lactic Acid, Venous 4.0 (*)    All other components within normal limits  ETHANOL - Abnormal; Notable for the following components:   Alcohol, Ethyl (B) 148 (*)    All other components within normal limits  ACETAMINOPHEN LEVEL - Abnormal; Notable for the following components:   Acetaminophen (Tylenol), Serum <10 (*)    All other components within normal limits  TROPONIN I  SALICYLATE LEVEL  URINALYSIS, ROUTINE W REFLEX MICROSCOPIC  RAPID URINE DRUG SCREEN, HOSP PERFORMED  I-STAT BETA HCG BLOOD, ED (MC, [facility], AP ONLY)  CBG MONITORING, ED  CBG MONITORING, ED    EKG EKG Interpretation  Date/Time:  Friday February 07 2019 06:20:56 EDT Ventricular Rate:  82 PR Interval:    QRS Duration: 89 QT Interval:  430 QTC Calculation: 503  R Axis:   89 Text Interpretation:  Sinus rhythm Anteroseptal infarct, age indeterminate Lateral leads are also involved No significant change since last tracing Confirmed by Orpah Greek [phone]) on 02/07/2019 6:23:47 AM   Radiology Ct Head Wo Contrast  Result Date: 02/07/2019 CLINICAL DATA:  43 year old female is unresponsive. Hypoglycemia and hypotension. EXAM: CT HEAD WITHOUT CONTRAST TECHNIQUE: Contiguous axial images were obtained from the base of the skull through the vertex without intravenous contrast. COMPARISON:  Head and cervical spine CT 10/20/2018. FINDINGS: Brain: Stable cerebral volume. No midline shift, ventriculomegaly, mass effect, evidence of mass lesion, intracranial hemorrhage or evidence of cortically based acute infarction. Gray-white matter differentiation is within normal limits throughout the brain. Vascular: Mild Calcified atherosclerosis at the skull base. No suspicious intracranial vascular hyperdensity. Skull: Intact.  No acute osseous abnormality identified. Sinuses/Orbits: Visualized paranasal sinuses and mastoids are stable and well pneumatized. Other: No acute orbit or scalp soft tissue findings. IMPRESSION: Stable and negative noncontrast CT appearance of the brain. Electronically Signed   By: Genevie Ann M.D.   On: 02/07/2019 06:28   Dg Chest Port 1 View  Result Date: 02/07/2019 CLINICAL DATA:  43 year old female is unresponsive. Hypoglycemia and hypotension. EXAM: PORTABLE CHEST 1 VIEW COMPARISON:  Chest radiographs 10/20/2018 and earlier. FINDINGS: Portable AP upright view at 0648 hours. Low lung volumes and lordotic positioning. Mild cardiomegaly appears stable. Other mediastinal contours are within normal limits. Visualized tracheal air column is within normal limits. Allowing for portable technique the lungs are clear. No pneumothorax. Paucity of bowel gas in the upper abdomen. No acute osseous abnormality identified. IMPRESSION: No acute cardiopulmonary  abnormality. Electronically  Signed   By: Genevie Ann M.D.   On: 02/07/2019 06:26    Procedures Procedures (including critical care time)  Medications Ordered in ED Medications  sodium chloride 0.9 % bolus 1,000 mL (has no administration in time range)  sodium chloride 0.9 % bolus 500 mL (500 mLs Intravenous New Bag/Given 02/07/19 0600)     Initial Impression / Assessment and Plan / ED Course  I have reviewed the triage vital signs and the nursing notes.  Pertinent labs & imaging results that were available during my care of the patient were reviewed by me and considered in my medical decision making (see chart for details).        Patient presents to the emergency department from home.  Patient noted to have altered mental status by family who initiated EMS.  EMS found patient to be hypoglycemic, administered D10.  Blood sugars improved here in the ER.  At arrival she was agitated, confused and diaphoretic.  This has resolved with treatment.  She was hypotensive at arrival.  She seems to have improved with IV fluids, but after the fluids were finished, blood pressure started to drop again.  Additional fluids will be administered.  Patient now awake and alert, answering questions appropriately.  She has not been ill.  Denies chest pain, cough, shortness of breath, nausea, vomiting, diarrhea.  She has had a history of anemia secondary to heavy menstrual bleeding.  She is currently menstruating.  She reports that her period usually lasts 10 days and she is approximately 7 days into her period at this time.  This explains her anemia.  Baseline appears to be in the eights, her hemoglobin of 7.4 therefore would not be enough of a drop to explain her hypotension.  She has not had any infectious type symptoms leading up to this and currently is afebrile with a normal white blood cell count.  Lactic acid is elevated, but sepsis is not likely.  She does have a history of seizure, initiation of her  confusion was not witnessed, this could have been a postictal state which would explain the elevated lactic acid.  Patient now also states that she might have taken additional insulin.  Will need to closely monitor blood sugars.  Will need to obtain urinalysis to identify possible infection in the urine.  If there is a positive urinalysis, then initiation of treatment for infection and possible urosepsis would be needed.  In the meantime will administer IV fluids, recheck lactic to see if it is clearing.  Will sign out to oncoming ER physician to follow-up on urine and disposition patient.  Final Clinical Impressions(s) / ED Diagnoses   Final diagnoses:  Hypoglycemia  Hypotension, unspecified hypotension type    ED Discharge Orders    None       Orpah Greek, MD 02/07/19 [phone]

## 2019-02-07 NOTE — ED Notes (Signed)
Patient transported to X-ray 

## 2019-02-07 NOTE — TOC Initial Note (Signed)
Transition of Care [facility]) - Initial/Assessment Note    Patient Details  Name: Jill Shaw MRN: [id number] Date of Birth: 1975-10-20  Transition of Care [facility]) CM/SW Contact:    Oletta Cohn, RN Phone Number: 02/07/2019, 9:52 AM  Clinical Narrative:                   Expected Discharge Plan: Home/Self Care Barriers to Discharge: Continued Medical Work up   Patient Goals and CMS Choice  [facility] consulted regarding medication assistance. Pt [facility] to [facility] does not know if she has orange card (AMS).  Will hand-off to Unit CM when to access when pt glucose stable and pt more coherent.        Expected Discharge Plan and Services Expected Discharge Plan: Home/Self Care     Post Acute Care Choice: Home Health Living arrangements for the past 2 months: Apartment Expected Discharge Date: (unknown)                                    Prior Living Arrangements/Services Living arrangements for the past 2 months: Apartment Lives with:: Minor Children, Parents   Do you feel safe going back to the place where you live?: Yes               Activities of Daily Living      Permission Sought/Granted Permission sought to share information with : Case Manager, Family Supports Permission granted to share information with : Yes, Verbal Permission Granted              Emotional Assessment   Attitude/Demeanor/Rapport: Unable to Assess(sleepy) Affect (typically observed): Quiet Orientation: : Oriented to Self, Fluctuating Orientation (Suspected and/or reported Sundowners) Alcohol / Substance Use: Tobacco Use Psych Involvement: No (comment)  Admission diagnosis:  low sugar Patient Active Problem List   Diagnosis Date Noted  . Hypoglycemia 02/07/2019  . Hypotension 02/07/2019  . Lactic acidosis 02/07/2019  . Right ankle pain 02/07/2019  . Chronic obstructive pulmonary disease (HCC) 02/05/2019  . Seizure (HCC) 07/31/2018  . Menorrhagia with irregular  cycle 05/17/2017  . Anemia of chronic disease 04/06/2017  . Knee pain, chronic 03/27/2016  . Type 2 diabetes mellitus without complication, with long-term current use of insulin (HCC) 03/27/2016  . Essential hypertension 03/27/2016  . Morbid obesity (HCC) 03/27/2016  . Irritable bowel syndrome with constipation 03/27/2016  . Tobacco dependence 03/27/2016   PCP:  Mike Gip, FNP Pharmacy:   [facility] [phone] - [place], [place] - [address] AT [facility] OF [address] & [address] [place] [address] Phone: [phone] Fax: [phone]  [facility] [place], [place] - [place] [address] [place] [address] Phone: [phone] Fax: [phone]     Social Determinants of Health (SDOH) Interventions    Readmission Risk Interventions No flowsheet data found.

## 2019-02-07 NOTE — ED Notes (Signed)
ED TO INPATIENT HANDOFF REPORT  ED Nurse Name and Phone #: Joni Reiningicole [phone]  S Name/Age/Gender Jill SauersJulie Mae Shaw 43 y.o. female Room/Bed: 030C/030C  Code Status   Code Status: Not on file  Home/SNF/Other Home Patient oriented to: self, place, time and situation Is this baseline? Yes   Triage Complete: Triage complete  Chief Complaint low sugar  Triage Note BIB EMS from home. EMS called for AMS. CBG 57 and initial BP 80/40 for EMS. Hx of DM with non-compliance. Given D10 and ~16100mL NS en route by EMS with mild improvement of mental status. Pt remains GCS 10 upon arrival in ED.    Allergies Allergies  Allergen Reactions  . Ketoprofen Nausea And Vomiting  . Aspirin Nausea Only  . Cortizone-10 [Hydrocortisone] Nausea Only  . Gabapentin Nausea And Vomiting    upset stomach  . Ibuprofen Nausea Only and Nausea And Vomiting  . Liraglutide Nausea And Vomiting  . Naproxen Nausea And Vomiting  . Omeprazole-Sodium Bicarbonate Nausea And Vomiting  . Other Nausea And Vomiting  . Sulfa Antibiotics Nausea And Vomiting  . Tramadol Nausea And Vomiting    stomach upset    Level of Care/Admitting Diagnosis ED Disposition    ED Disposition Condition Comment   Admit  [facility] [[id number][facility]  Level of Care: Med-Surg [16]  I expect the patient will be discharged within 24 hours: Yes  LOW acuity---Tx typically complete <24 hrs---ACUTE conditions typically can be evaluated <24 hours---LABS likely to return to acceptable levels <24 hours---IS near functional baseline---EXPECTED to return to current living arrangement---NOT newly hypoxic: Does not meet criteria for 5C-Observation unit  Covid Evaluation: N/A  Diagnosis: Hypoglycemia [841324][242204]  Admitting Physician: Jonah BlueYATES, JENNIFER [[id number]]  Attending Physician: Jonah BlueYATES, JENNIFER [[id number]]  PT Class (Do Not Modify): Observation [104]  PT Acc Code (Do Not Modify): Observation [10022]       B Medical/Surgery  History Past Medical History:  Diagnosis Date  . Anemia   . Arthritis    knees, hands  . Asthma   . COPD (chronic obstructive pulmonary disease) (HCC)   . Diabetes mellitus without complication (HCC)    type 2  . GERD (gastroesophageal reflux disease)   . Hypertension   . Neuromuscular disorder (HCC)    neuropathy feet  . Seizures (HCC)    pt states r/t stress and blood sugar - no meds last one 4 months ago, not seen neurologist  . Sickle cell trait (HCC)   . Smoker    Past Surgical History:  Procedure Laterality Date  . CESAREAN SECTION     x 1  . EYE SURGERY Bilateral    laser right and cataract removed left eye  . UPPER GI ENDOSCOPY  07/2017     A IV Location/Drains/Wounds Patient Lines/Drains/Airways Status   Active Line/Drains/Airways    Name:   Placement date:   Placement time:   Site:   Days:   Peripheral IV 02/07/19 Right Antecubital   02/07/19    0547    Antecubital   less than 1          Intake/Output Last 24 hours  Intake/Output Summary (Last 24 hours) at 02/07/2019 0940 Last data filed at 02/07/2019 0912 Gross per 24 hour  Intake 1599.74 ml  Output 150 ml  Net 1449.74 ml    Labs/Imaging Results for orders placed or performed during the hospital encounter of 02/07/19 (from the past 48 hour(s))  CBG monitoring, ED     Status: None  Collection Time: 02/07/19  5:46 AM  Result Value Ref Range   Glucose-Capillary 88 70 - 99 mg/dL  I-Stat beta hCG blood, ED ([facility], [facility], [facility] only)     Status: None   Collection Time: 02/07/19  5:58 AM  Result Value Ref Range   I-stat hCG, quantitative <5.0 <5 mIU/mL   Comment 3            Comment:   GEST. AGE      CONC.  (mIU/mL)   <=1 WEEK        5 - 50     2 WEEKS       50 - 500     3 WEEKS       100 - 10,000     4 WEEKS     1,000 - 30,000        FEMALE AND NON-PREGNANT FEMALE:     LESS THAN 5 mIU/mL   CBC with Differential/Platelet     Status: Abnormal   Collection Time: 02/07/19  6:18 AM  Result Value Ref Range    WBC 8.0 4.0 - 10.5 K/uL   RBC 3.54 (L) 3.87 - 5.11 MIL/uL   Hemoglobin 7.4 (L) 12.0 - 15.0 g/dL    Comment: Reticulocyte Hemoglobin testing may be clinically indicated, consider ordering this additional test [id number]    HCT 25.5 (L) 36.0 - 46.0 %   MCV 72.0 (L) 80.0 - 100.0 fL   MCH 20.9 (L) 26.0 - 34.0 pg   MCHC 29.0 (L) 30.0 - 36.0 g/dL   RDW 54.0 (H) 98.1 - 19.1 %   Platelets 225 150 - 400 K/uL    Comment: REPEATED TO VERIFY   nRBC 0.2 0.0 - 0.2 %   Neutrophils Relative % 69 %   Neutro Abs 5.5 1.7 - 7.7 K/uL   Lymphocytes Relative 21 %   Lymphs Abs 1.7 0.7 - 4.0 K/uL   Monocytes Relative 7 %   Monocytes Absolute 0.6 0.1 - 1.0 K/uL   Eosinophils Relative 2 %   Eosinophils Absolute 0.2 0.0 - 0.5 K/uL   Basophils Relative 1 %   Basophils Absolute 0.1 0.0 - 0.1 K/uL   Immature Granulocytes 0 %   Abs Immature Granulocytes 0.03 0.00 - 0.07 K/uL    Comment: Performed at [facility], [address], [place], [place] [address]  Basic metabolic panel     Status: Abnormal   Collection Time: 02/07/19  6:18 AM  Result Value Ref Range   Sodium 141 135 - 145 mmol/L   Potassium 3.6 3.5 - 5.1 mmol/L   Chloride 110 98 - 111 mmol/L   CO2 17 (L) 22 - 32 mmol/L   Glucose, Bld 75 70 - 99 mg/dL   BUN 8 6 - 20 mg/dL   Creatinine, Ser 5.62 (H) 0.44 - 1.00 mg/dL   Calcium 8.7 (L) 8.9 - 10.3 mg/dL   GFR calc non Af Amer 49 (L) >60 mL/min   GFR calc Af Amer 56 (L) >60 mL/min   Anion gap 14 5 - 15    Comment: Performed at [facility], [address], [place], [place] [address]  Troponin I - ONCE - STAT     Status: None   Collection Time: 02/07/19  6:18 AM  Result Value Ref Range   Troponin I <0.03 <0.03 ng/mL    Comment: Performed at [facility], [address], [place], [place] [address]  Lactic acid, plasma  Status: Abnormal   Collection Time: 02/07/19  6:18 AM  Result Value Ref Range   Lactic Acid, Venous 4.0 (HH) 0.5 - 1.9 mmol/L    Comment: CRITICAL RESULT  CALLED TO, READ BACK BY AND VERIFIED WITH: TOBIAS M,RN 02/07/19 0700 WAYK Performed at [facility], [address] [place], [place] [address]   Ethanol     Status: Abnormal   Collection Time: 02/07/19  6:18 AM  Result Value Ref Range   Alcohol, Ethyl (B) 148 (H) <10 mg/dL    Comment: (NOTE) Lowest detectable limit for serum alcohol is 10 mg/dL. For medical purposes only. Performed at [facility], [address] [place], [place] [address]   Salicylate level     Status: None   Collection Time: 02/07/19  6:18 AM  Result Value Ref Range   Salicylate Lvl <7.0 2.8 - 30.0 mg/dL    Comment: Performed at [facility], [address], [place], [place] [address]  Acetaminophen level     Status: Abnormal   Collection Time: 02/07/19  6:18 AM  Result Value Ref Range   Acetaminophen (Tylenol), Serum <10 (L) 10 - 30 ug/mL    Comment: (NOTE) Therapeutic concentrations vary significantly. A range of 10-30 ug/mL  may be an effective concentration for many patients. However, some  are best treated at concentrations outside of this range. Acetaminophen concentrations >150 ug/mL at 4 hours after ingestion  and >50 ug/mL at 12 hours after ingestion are often associated with  toxic reactions. Performed at [facility], [address], [place], [place] [address]   CBG monitoring, ED     Status: Abnormal   Collection Time: 02/07/19  7:20 AM  Result Value Ref Range   Glucose-Capillary 63 (L) 70 - 99 mg/dL  CBG monitoring, ED     Status: Abnormal   Collection Time: 02/07/19  8:03 AM  Result Value Ref Range   Glucose-Capillary 170 (H) 70 - 99 mg/dL  Urinalysis, Routine w reflex microscopic     Status: Abnormal   Collection Time: 02/07/19  8:38 AM  Result Value Ref Range   Color, Urine YELLOW YELLOW   APPearance HAZY (A) CLEAR   Specific Gravity, Urine 1.020 1.005 - 1.030   pH 5.0 5.0 - 8.0   Glucose, UA 250 (A) NEGATIVE mg/dL   Hgb urine dipstick MODERATE (A) NEGATIVE    Bilirubin Urine NEGATIVE NEGATIVE   Ketones, ur 15 (A) NEGATIVE mg/dL   Protein, ur NEGATIVE NEGATIVE mg/dL   Nitrite NEGATIVE NEGATIVE   Leukocytes,Ua NEGATIVE NEGATIVE    Comment: Performed at [facility], [address] [place], [place] [address]  Rapid urine drug screen (hospital performed)     Status: None   Collection Time: 02/07/19  8:38 AM  Result Value Ref Range   Opiates NONE DETECTED NONE DETECTED   Cocaine NONE DETECTED NONE DETECTED   Benzodiazepines NONE DETECTED NONE DETECTED   Amphetamines NONE DETECTED NONE DETECTED   Tetrahydrocannabinol NONE DETECTED NONE DETECTED   Barbiturates NONE DETECTED NONE DETECTED    Comment: (NOTE) DRUG SCREEN FOR MEDICAL PURPOSES ONLY.  IF CONFIRMATION IS NEEDED FOR ANY PURPOSE, NOTIFY LAB WITHIN 5 DAYS. LOWEST DETECTABLE LIMITS FOR URINE DRUG SCREEN Drug Class                     Cutoff (ng/mL) Amphetamine and metabolites    1000 Barbiturate and metabolites    200 Benzodiazepine  200 Tricyclics and metabolites     300 Opiates and metabolites        300 Cocaine and metabolites        300 THC                            50 Performed at [facility], [address], [place], [place] [address]   Urinalysis, Microscopic (reflex)     Status: Abnormal   Collection Time: 02/07/19  8:38 AM  Result Value Ref Range   RBC / HPF 6-10 0 - 5 RBC/hpf   WBC, UA 0-5 0 - 5 WBC/hpf   Bacteria, UA FEW (A) NONE SEEN   Squamous Epithelial / LPF 0-5 0 - 5   Hyaline Casts, UA PRESENT    Granular Casts, UA PRESENT    Amorphous [name] PRESENT     Comment: Performed at [facility], [address], [place], [place] [address]   Dg Ankle Complete Right  Result Date: 02/07/2019 CLINICAL DATA:  43 year old female with fall, swelling and pain. EXAM: RIGHT ANKLE - COMPLETE 3+ VIEW COMPARISON:  Right ankle series 05/07/2017. FINDINGS: Preserved mortise joint alignment. Talar dome intact. No joint effusion identified.  Chronic pes planus and calcaneus degenerative spurring. The distal tibia, fibula, and calcaneus appear intact. Visible bones of the right foot appear intact. Generalized soft tissue swelling but no discrete soft tissue injury. IMPRESSION: No acute fracture or dislocation identified about the right ankle. Electronically Signed   By: Odessa Fleming M.D.   On: 02/07/2019 09:28   Ct Head Wo Contrast  Result Date: 02/07/2019 CLINICAL DATA:  43 year old female is unresponsive. Hypoglycemia and hypotension. EXAM: CT HEAD WITHOUT CONTRAST TECHNIQUE: Contiguous axial images were obtained from the base of the skull through the vertex without intravenous contrast. COMPARISON:  Head and cervical spine CT 10/20/2018. FINDINGS: Brain: Stable cerebral volume. No midline shift, ventriculomegaly, mass effect, evidence of mass lesion, intracranial hemorrhage or evidence of cortically based acute infarction. Gray-white matter differentiation is within normal limits throughout the brain. Vascular: Mild Calcified atherosclerosis at the skull base. No suspicious intracranial vascular hyperdensity. Skull: Intact.  No acute osseous abnormality identified. Sinuses/Orbits: Visualized paranasal sinuses and mastoids are stable and well pneumatized. Other: No acute orbit or scalp soft tissue findings. IMPRESSION: Stable and negative noncontrast CT appearance of the brain. Electronically Signed   By: Odessa Fleming M.D.   On: 02/07/2019 06:28   Dg Chest Port 1 View  Result Date: 02/07/2019 CLINICAL DATA:  43 year old female is unresponsive. Hypoglycemia and hypotension. EXAM: PORTABLE CHEST 1 VIEW COMPARISON:  Chest radiographs 10/20/2018 and earlier. FINDINGS: Portable AP upright view at 0648 hours. Low lung volumes and lordotic positioning. Mild cardiomegaly appears stable. Other mediastinal contours are within normal limits. Visualized tracheal air column is within normal limits. Allowing for portable technique the lungs are clear. No pneumothorax.  Paucity of bowel gas in the upper abdomen. No acute osseous abnormality identified. IMPRESSION: No acute cardiopulmonary abnormality. Electronically Signed   By: Odessa Fleming M.D.   On: 02/07/2019 06:26    Pending Labs Unresulted Labs (From admission, onward)    Start     Ordered   02/07/19 0919  Lactic acid, plasma  Once,   R     02/07/19 0918          Vitals/Pain Today's Vitals   02/07/19 0801 02/07/19 0815 02/07/19 0830 02/07/19 0900  BP: (!) 121/49 (!) 98/54 Marland Kitchen)  114/53 (!) 112/57  Pulse: 94 91 92 87  Resp: 16 (!) 27 18 (!) 27  Temp:      TempSrc:      SpO2: 100% 100% 100% 100%  Weight:      Height:      PainSc:        Isolation Precautions No active isolations  Medications Medications  sodium chloride 0.9 % bolus 500 mL (0 mLs Intravenous Stopped 02/07/19 0742)  sodium chloride 0.9 % bolus 1,000 mL (0 mLs Intravenous Stopped 02/07/19 0912)  dextrose 50 % solution 50 mL (50 mLs Intravenous Given 02/07/19 0735)    Mobility walks High fall risk      R Recommendations: See Admitting Provider Note  Report given to:   Additional Notes:

## 2019-02-07 NOTE — ED Notes (Signed)
ED Provider at bedside. 

## 2019-02-07 NOTE — ED Provider Notes (Addendum)
  Physical Exam  BP 90/74   Pulse 87   Temp 98.2 F (36.8 C) (Oral)   Resp (!) 30   Ht 5\' 4"  (1.626 m)   Wt 126 kg   LMP 02/03/2019   SpO2 100%   BMI 47.68 kg/m   Physical Exam  ED Course/Procedures     Procedures  MDM  **Received patient in signout.  Has had recurrent hypoglycemia.  Now requiring D50 and started D5 drip.  Still pending urine.  Has some hypotension which could be related to hypoglycemia.  Has not had a fever.  Lactic acid easily due to the hypoglycemic episode.  However with the need for IV insulin recurrently will admit to hospital.  Could potentially be an accidental insulin overdose.       Benjiman Core, MD 02/07/19 [phone]  Has been seen by Dr. Ophelia Charter for admission.  Sugar has been stable off the drip and patient is eaten.  Both patient and Dr. feels if she is good for discharge.  Will discharge home.    Benjiman Core, MD 02/07/19 1015

## 2019-02-07 NOTE — Consult Note (Signed)
ER Consultation   Aviela Blundell MNO:[id number] DOB: 06-18-1976 DOA: 02/07/2019  PCP: Lanae Boast, FNP Consultants:  None Patient coming from:  Home - lives with mother and her 42yo twins; NOK: Mother, [phone]  Chief Complaint: AMS  HPI: Jill Shaw is a 43 y.o. female with medical history significant of seizures; HTN; DM; and COPD presenting with AMS.  Her blood pressure dropped - her doctor has told her recently that her BP has been up so she is not sure why it was low.  She went to the doctor 2 days ago after a fall is was 170/? And she was given 2 pills in the office to bring it down.  She takes 100 units insulin every 12 hours.  Her sugar just seemed to drop rapidly this time.  She is on a lot of medication and her sugars are labile and sometimes she can only eat one meal.  The pills mess up her stomach because she has a weak stomach.  She says it is possible that she took too much insulin - she hasn't been able to get in touch with her pharmacy to get her pens and she has been having to pull up the insulin with her syringe and sometimes she loses track.    Currently, she feels fine.  She does not think she needs to come into the hospital.  She does, however, complain of R ankle pain after falling 2 days ago.  This has not been imaged.  ED Course:   Probable accidental insulin overdose.  Lots of insulin containers around, "she could have not known what she was taking."  Appears to need a drip. Hgb 7.4, baseline 8.6 - with heavy menses and on menses now.  Review of Systems: As per HPI; otherwise review of systems reviewed and negative.   Ambulatory Status:  Ambulates with a cane  Past Medical History:  Diagnosis Date   Anemia    Arthritis    knees, hands   Asthma    COPD (chronic obstructive pulmonary disease) (HCC)    Diabetes mellitus without complication (HCC)    type 2   GERD (gastroesophageal reflux disease)    Hypertension    Neuromuscular disorder  (HCC)    neuropathy feet   Seizures (HCC)    pt states r/t stress and blood sugar - no meds last one 4 months ago, not seen neurologist   Sickle cell trait ([place])    Smoker     Past Surgical History:  Procedure Laterality Date   CESAREAN SECTION     x 1   EYE SURGERY Bilateral    laser right and cataract removed left eye   UPPER GI ENDOSCOPY  07/2017    Social History   Socioeconomic History   Marital status: Single    Spouse name: Not on file   Number of children: Not on file   Years of education: Not on file   Highest education level: Not on file  Occupational History   Not on file  Social Needs   Financial resource strain: Not on file   Food insecurity:    Worry: Not on file    Inability: Not on file   Transportation needs:    Medical: Not on file    Non-medical: Not on file  Tobacco Use   Smoking status: Current Every Day Smoker    Packs/day: 0.25    Years: 15.00    Pack years: 3.75    Types: Cigarettes  Smokeless tobacco: Never Used  Substance and Sexual Activity   Alcohol use: No   Drug use: No   Sexual activity: Not Currently    Birth control/protection: None  Lifestyle   Physical activity:    Days per week: Not on file    Minutes per session: Not on file   Stress: Not on file  Relationships   Social connections:    Talks on phone: Not on file    Gets together: Not on file    Attends religious service: Not on file    Active member of club or organization: Not on file    Attends meetings of clubs or organizations: Not on file    Relationship status: Not on file   Intimate partner violence:    Fear of current or ex partner: Not on file    Emotionally abused: Not on file    Physically abused: Not on file    Forced sexual activity: Not on file  Other Topics Concern   Not on file  Social History Narrative   Not on file    Allergies  Allergen Reactions   Ketoprofen Nausea And Vomiting   Aspirin Nausea Only    Cortizone-10 [Hydrocortisone] Nausea Only   Gabapentin Nausea And Vomiting    upset stomach   Ibuprofen Nausea Only and Nausea And Vomiting   Liraglutide Nausea And Vomiting   Naproxen Nausea And Vomiting   Omeprazole-Sodium Bicarbonate Nausea And Vomiting   Other Nausea And Vomiting   Sulfa Antibiotics Nausea And Vomiting   Tramadol Nausea And Vomiting    stomach upset    Family History  Problem Relation Age of Onset   Diabetes Mother    Hypertension Mother     Prior to Admission medications   Medication Sig Start Date End Date Taking? Authorizing Provider  albuterol (PROVENTIL HFA;VENTOLIN HFA) 108 (90 Base) MCG/ACT inhaler Inhale 2 puffs into the lungs every 6 (six) hours as needed for wheezing or shortness of breath. 12/05/18   Lanae Boast, FNP  albuterol (PROVENTIL) (2.5 MG/3ML) 0.083% nebulizer solution Take 3 mLs (2.5 mg total) by nebulization every 6 (six) hours as needed for wheezing or shortness of breath. 07/31/18   Lanae Boast, FNP  amLODipine (NORVASC) 5 MG tablet Take 1 tablet (5 mg total) by mouth daily. 12/05/18   Lanae Boast, FNP  Blood Glucose Monitoring Suppl (TRUE METRIX METER) w/Device KIT 1 each by Does not apply route 4 (four) times daily -  before meals and at bedtime. 01/21/18   Dorena Dew, FNP  budesonide-formoterol (SYMBICORT) 160-4.5 MCG/ACT inhaler Inhale 2 puffs into the lungs 2 (two) times daily. 01/07/18   Dorena Dew, FNP  cholecalciferol (VITAMIN D) 1000 units tablet Take 1,000 Units by mouth daily.    [provider]  ferrous sulfate 300 (60 Fe) MG/5ML syrup Take 5 mLs (300 mg total) by mouth daily. 10/23/18   Lanae Boast, FNP  fluticasone (FLONASE) 50 MCG/ACT nasal spray Place 2 sprays into both nostrils daily. 12/05/18   Lanae Boast, FNP  furosemide (LASIX) 40 MG tablet Take 1 tablet (40 mg total) by mouth daily. 12/09/18   Lanae Boast, FNP  glucose blood (TRUE METRIX BLOOD GLUCOSE TEST) test strip Use as  instructed 01/21/18   Dorena Dew, FNP  Insulin Lispro Prot & Lispro (HUMALOG MIX 75/25 KWIKPEN) (75-25) 100 UNIT/ML Kwikpen INJECT 100 UNITS EVERY 12 HOURS 12/04/18   Lanae Boast, FNP  losartan (COZAAR) 100 MG tablet Take 1 tablet (100 mg  total) by mouth daily. 12/05/18   Lanae Boast, FNP  metoprolol succinate (TOPROL-XL) 25 MG 24 hr tablet Take 0.5 tablets (12.5 mg total) by mouth daily. 12/05/18   Lanae Boast, FNP  omeprazole (PRILOSEC) 40 MG capsule Take 1 capsule (40 mg total) by mouth daily. 12/20/17   Dorena Dew, FNP  potassium chloride (K-DUR) 10 MEQ tablet Take 1 tablet (10 mEq total) by mouth daily. 12/05/18   Lanae Boast, FNP  pregabalin (LYRICA) 75 MG capsule Take 1 capsule (75 mg total) by mouth 2 (two) times daily. 10/18/17   Charlott Rakes, MD  saxagliptin HCl (ONGLYZA) 2.5 MG TABS tablet Take 1 tablet (2.5 mg total) by mouth daily. 12/05/18   Lanae Boast, FNP    Physical Exam: Vitals:   02/07/19 0801 02/07/19 0815 02/07/19 0830 02/07/19 0900  BP: (!) 121/49 (!) 98/54 (!) 114/53 (!) 112/57  Pulse: 94 91 92 87  Resp: 16 (!) 27 18 (!) 27  Temp:      TempSrc:      SpO2: 100% 100% 100% 100%  Weight:      Height:          General:  Appears calm and comfortable and is NAD; morbidly obese  Eyes:  PERRL, EOMI, normal lids, iris  ENT:  grossly normal hearing, lips & tongue, mmm; poor dentition  Neck:  no LAD, masses or thyromegaly  Cardiovascular:  RRR, no m/r/g. No LE edema.   Respiratory:   CTA bilaterally with no wheezes/rales/rhonchi.  Normal respiratory effort.  Abdomen:  soft, NT, ND, NABS  Skin:  no rash or induration seen on limited exam  Musculoskeletal:  R ankle with mild erythema and edema with significant TTP with light touch  Lower extremity:  No LE edema.  Limited foot exam with no ulcerations.  2+ distal pulses.  Psychiatric:  grossly normal mood and affect, speech fluent and appropriate, AOx3  Neurologic:  CN 2-12 grossly intact,  moves all extremities in coordinated fashion, sensation intact    Radiological Exams on Admission: Ct Head Wo Contrast  Result Date: 02/07/2019 CLINICAL DATA:  43 year old female is unresponsive. Hypoglycemia and hypotension. EXAM: CT HEAD WITHOUT CONTRAST TECHNIQUE: Contiguous axial images were obtained from the base of the skull through the vertex without intravenous contrast. COMPARISON:  Head and cervical spine CT 10/20/2018. FINDINGS: Brain: Stable cerebral volume. No midline shift, ventriculomegaly, mass effect, evidence of mass lesion, intracranial hemorrhage or evidence of cortically based acute infarction. Gray-white matter differentiation is within normal limits throughout the brain. Vascular: Mild Calcified atherosclerosis at the skull base. No suspicious intracranial vascular hyperdensity. Skull: Intact.  No acute osseous abnormality identified. Sinuses/Orbits: Visualized paranasal sinuses and mastoids are stable and well pneumatized. Other: No acute orbit or scalp soft tissue findings. IMPRESSION: Stable and negative noncontrast CT appearance of the brain. Electronically Signed   By: Genevie Ann M.D.   On: 02/07/2019 06:28   Dg Chest Port 1 View  Result Date: 02/07/2019 CLINICAL DATA:  43 year old female is unresponsive. Hypoglycemia and hypotension. EXAM: PORTABLE CHEST 1 VIEW COMPARISON:  Chest radiographs 10/20/2018 and earlier. FINDINGS: Portable AP upright view at 0648 hours. Low lung volumes and lordotic positioning. Mild cardiomegaly appears stable. Other mediastinal contours are within normal limits. Visualized tracheal air column is within normal limits. Allowing for portable technique the lungs are clear. No pneumothorax. Paucity of bowel gas in the upper abdomen. No acute osseous abnormality identified. IMPRESSION: No acute cardiopulmonary abnormality. Electronically Signed   By: Genevie Ann  M.D.   On: 02/07/2019 06:26    EKG: Independently reviewed.  NSR with rate 82; nonspecific ST  changes with no evidence of acute ischemia; NSCSLT   Labs on Admission: I have personally reviewed the available labs and imaging studies at the time of the admission.  Pertinent labs:   CO2 17 BUN 8/Creatinine 1.34/GFR 56; 13/0.97/>60 on 1/5 Troponin <0.03 Lactate 4.0 WBC 8.0 Hgb 7.4; 8.6 on 1/5 Glucose 75, 63, 170 APAP <10 ASA <7 ETOH 148 HCG <5   Assessment/Plan Principal Problem:   Hypoglycemia Active Problems:   Morbid obesity (HCC)   Anemia of chronic disease   Hypotension   Lactic acidosis   Right ankle pain   Hypoglycemia -Patient was found unresponsive and hypoglycemic by EMS -Suspect that this was multifactorial - she had been drinking ETOH; was found to have a plethora of insulin bottles around; and acknowledges difficulty in self-administering insulin via syringe rather than via pen -She was given dextrose with improvement but had mild rebound hypoglycemia to 63 -Following the mild rebound hypoglycemia, she was given D50, fed, and started on a dextrose infusion -At the time of my evaluation, the patient was alert and oriented -Her repeat glucose was 170 -For now, will turn off dextrose infusion and plan to recheck glucose 1 hour later; if normoglycemic, she should be appropriate for d/c to home - and she is in agreement with this plan -Will request CM consult to assist with patient obtaining insulin pens, as there appear to be medical literacy issues complicating her ability to provide insulin via syringe in safe doses -Note: repeat glucose was 236 and patient desires discharge  Hypotension in the setting of chronic HTN -Her BP at her 4/22 office visit was 176/86; it is not clear if medication changes were made at that time -Suggest holding PO medications for now (Norvasc, Cozaar, Toprol) and adding back slowly over the next few days as her BP will allow - check BP and add back one medication at a time if SBP >140 on 2 different checks. -If SBP is >180 on 2  checks, may add back 2 medications  Anemia -Patient with chronic baseline anemia and currently on menses -Suggest outpatient f/u for this issue -She denies symptoms at this time -Add iron  Lactic acidosis -Patient with significantly elevated ETOH level in addition to hypoglycemia and hypotension -No current concern for sepsis -Recheck prior to d/c ensured IVF resulted in some improvement  R ankle pain s/p fall -She was seen by her PCP for this issue on 4/22, but the note is incomplete -She reports not having xrays taken -Her exam shows an exquisitely tender ankle without significant bony abnormality -Imaging today is negative for fracture -Encourage outpatient RICE   Based on patient's overall stabilization following appropriate ER treatment, she is appropriate for d/c to home.  I have notified Dr. Alvino Chapel that she does not require admission at this time.   Karmen Bongo MD [facility]   How to contact the [facility] Attending or Consulting provider [place] or covering provider during after hours [place], for this patient?  1. Check the care team in [facility] and look for a) attending/consulting [facility] provider listed and b) the [facility] team listed 2. Log into [email or web address] and use [place]'s universal password to access. If you do not have the password, please contact the hospital operator. 3. Locate the [facility] provider you are looking for under [facility] and page to a number that you can be  directly reached. 4. If you still have difficulty reaching the provider, please page the [facility] (Director on Call) for the Hospitalists listed on amion for assistance.   02/07/2019, 9:24 AM

## 2019-02-07 NOTE — ED Notes (Signed)
Patient transported to CT 

## 2019-02-07 NOTE — ED Notes (Signed)
Urine culture sent with urine specimen 

## 2019-02-14 ENCOUNTER — Other Ambulatory Visit: Payer: Self-pay

## 2019-02-14 ENCOUNTER — Ambulatory Visit (HOSPITAL_COMMUNITY)
Admission: RE | Admit: 2019-02-14 | Discharge: 2019-02-14 | Disposition: A | Discharge status: Home / Self Care | Payer: Self-pay | Source: Ambulatory Visit | Attending: Family Medicine | Admitting: Family Medicine

## 2019-02-14 DIAGNOSIS — G44029 Chronic cluster headache, not intractable: Secondary | ICD-10-CM

## 2019-02-15 ENCOUNTER — Other Ambulatory Visit: Payer: Self-pay | Admitting: Family Medicine

## 2019-02-15 DIAGNOSIS — Z794 Long term (current) use of insulin: Principal | ICD-10-CM

## 2019-02-15 DIAGNOSIS — E119 Type 2 diabetes mellitus without complications: Secondary | ICD-10-CM

## 2019-02-17 MED FILL — $HUMALOG MIX 75-25 KWIKPEN: (75-25) 100 | 28 days supply | Qty: 60 | Fill #0

## 2019-02-17 MED FILL — AMLODIPINE BESYLATE 5 MG TA: 5 | 30 days supply | Qty: 30 | Fill #2

## 2019-02-17 MED FILL — FUROSEMIDE 40 MG TAB: 40 | 30 days supply | Qty: 30 | Fill #1

## 2019-02-17 MED FILL — LOSARTAN POTASSIUM 100 MG T: 100 | 30 days supply | Qty: 30 | Fill #0

## 2019-02-19 ENCOUNTER — Telehealth: Payer: Self-pay

## 2019-02-19 DIAGNOSIS — G8929 Other chronic pain: Secondary | ICD-10-CM

## 2019-02-19 NOTE — Telephone Encounter (Signed)
Patient called, 1.She is asking for rx for walking cane that should have been sent in last visit. Can this order be put in and sent to [facility]? 2. Wants to know if you reviewed the paper work she mailed in and wants to know "how my lungs look".  3. Sates she has had her MRI of brain and now wants scan done of feet/legs.  4. Wants to make sure cane is documented in last note and eye problems are in last note for her trying to get disability.  Please advise. Thanks!

## 2019-02-19 NOTE — Telephone Encounter (Signed)
Called, no answer. Left a message for patient to call back. Thanks!  

## 2019-02-20 NOTE — Telephone Encounter (Signed)
Message sent to provider 

## 2019-02-20 NOTE — Telephone Encounter (Signed)
Message snt

## 2019-02-25 NOTE — Telephone Encounter (Signed)
Called, no answer. Left a message for patient to call back.  

## 2019-02-25 NOTE — Telephone Encounter (Signed)
Patient returned call. I advised that cane has been ordered and we will send to [facility] for her. Advised her that he medical issues have been documented on throughout her appointments as your patient. I advised her that you recommend that next MRi be of spine/spinal cord due to falls and she says:   " No, I want my feet and legs to be done first, they really need to be done". She also is still asking "how do my lungs look" from that paper work she mailed [place]. Then she is asking for an antibiotic for a uti, she says you gave her one last time. I advised her we don't do abx without an appointment and we will need to check her urine for this. She said "will you please just ask her, she's done it before". She has a next scheduled appointment on 03/06/19. Please advised. Thanks!

## 2019-02-26 NOTE — Telephone Encounter (Signed)
Printed order for cane

## 2019-03-04 MED FILL — TIZANIDINE HCL 6 MG CAPS: 6 | 30 days supply | Qty: 90 | Fill #0

## 2019-03-05 ENCOUNTER — Telehealth: Payer: Self-pay

## 2019-03-05 NOTE — Telephone Encounter (Signed)
Called to do COVID Screening for appointment tomorrow. No answer. Voicemail did not pick up Thanks!

## 2019-03-06 ENCOUNTER — Ambulatory Visit (INDEPENDENT_AMBULATORY_CARE_PROVIDER_SITE_OTHER): Payer: Self-pay | Admitting: Family Medicine

## 2019-03-06 ENCOUNTER — Other Ambulatory Visit: Payer: Self-pay

## 2019-03-06 DIAGNOSIS — F17200 Nicotine dependence, unspecified, uncomplicated: Secondary | ICD-10-CM

## 2019-03-06 DIAGNOSIS — Z794 Long term (current) use of insulin: Secondary | ICD-10-CM

## 2019-03-06 DIAGNOSIS — J449 Chronic obstructive pulmonary disease, unspecified: Secondary | ICD-10-CM

## 2019-03-06 DIAGNOSIS — E119 Type 2 diabetes mellitus without complications: Secondary | ICD-10-CM

## 2019-03-06 DIAGNOSIS — I10 Essential (primary) hypertension: Secondary | ICD-10-CM

## 2019-03-06 MED ORDER — INSULIN LISPRO PROT & LISPRO (75-25 MIX) 100 UNIT/ML KWIKPEN: PEN_INJECTOR | SUBCUTANEOUS | 11 refills | Status: DC

## 2019-03-06 MED ORDER — POTASSIUM CHLORIDE CRYS ER 20 MEQ PO TBCR: 20.0000 meq | EXTENDED_RELEASE_TABLET | Freq: Every day | ORAL | 3 refills | Status: DC

## 2019-03-06 MED ORDER — PEN NEEDLES 30G X 5 MM MISC: 1.0000 [IU] | Freq: Two times a day (BID) | 1 refills | Status: DC

## 2019-03-06 NOTE — Progress Notes (Signed)
  [facility]  Virtual Visit via Telephone Note  I connected with Jill Shaw on 03/06/19 at  2:00 PM EDT by telephone and verified that I am speaking with the correct person using two identifiers.   I discussed the limitations, risks, security and privacy concerns of performing an evaluation and management service by telephone and the availability of in person appointments. I also discussed with the patient that there may be a patient responsible charge related to this service. The patient expressed understanding and agreed to proceed.   History of Present Illness: Jill Shaw  has a past medical history of Anemia, Arthritis, Asthma, COPD (chronic obstructive pulmonary disease) (HCC), Diabetes mellitus without complication (HCC), GERD (gastroesophageal reflux disease), Hypertension, Neuromuscular disorder (HCC), Seizures (HCC), Sickle cell trait (HCC), and Smoker. Patient was seen in the ED on 02/07/2019 due to altered mental status. Patient states that she has been having difficulty with obtaining insulin needles for her pens. She was given a vial and is having difficulty with seeing the numbers on the syringe. She has a history of vision problems and was scheduled to have surgery and lost her insurance. She states that she is having full body cramps and is requesting an increase in her potassium pills. She also has a history of COPD and is requesting a referral to pulmonology.     Observations/Objective: Patient with regular voice tone, rate and rhythm. Speaking calmly and is in no apparent distress.    Assessment and Plan:  1. Chronic obstructive pulmonary disease, unspecified COPD type (HCC) - Ambulatory referral to Pulmonology  2. Essential hypertension - potassium chloride SA (K-DUR) 20 MEQ tablet; Take 1 tablet (20 mEq total) by mouth daily.  Dispense: 30 tablet; Refill: 3  3. Type 2 diabetes mellitus without complication,  with long-term current use of insulin (HCC) - Insulin Pen Needle (PEN NEEDLES) 30G X 5 MM MISC; 1 Units by Does not apply route 2 (two) times a day.  Dispense: 100 each; Refill: 1  4. Tobacco dependence Smoking cessation instruction/counseling given:  counseled patient on the dangers of tobacco use, advised patient to stop smoking, and reviewed strategies to maximize success  Follow Up Instructions:  We discussed hand washing, using hand sanitizer when soap and water are not available, only going out when absolutely necessary, and social distancing. Explained to patient that she is immunocompromised and will need to take precautions during this time.   I discussed the assessment and treatment plan with the patient. The patient was provided an opportunity to ask questions and all were answered. The patient agreed with the plan and demonstrated an understanding of the instructions.   The patient was advised to call back or seek an in-person evaluation if the symptoms worsen or if the condition fails to improve as anticipated.  I provided 15 minutes of non-face-to-face time during this encounter.  Ms. Andr L. [name], FNP-BC [facility] [address] [place], [place] [address] [phone]

## 2019-03-08 ENCOUNTER — Emergency Department (HOSPITAL_COMMUNITY)
Admission: EM | Admit: 2019-03-08 | Discharge: 2019-03-08 | Disposition: A | Discharge status: Home / Self Care | Payer: Self-pay | Attending: Emergency Medicine | Admitting: Emergency Medicine

## 2019-03-08 ENCOUNTER — Encounter (HOSPITAL_COMMUNITY): Payer: Self-pay

## 2019-03-08 DIAGNOSIS — I10 Essential (primary) hypertension: Secondary | ICD-10-CM | POA: Insufficient documentation

## 2019-03-08 DIAGNOSIS — F10929 Alcohol use, unspecified with intoxication, unspecified: Secondary | ICD-10-CM

## 2019-03-08 DIAGNOSIS — J449 Chronic obstructive pulmonary disease, unspecified: Secondary | ICD-10-CM | POA: Insufficient documentation

## 2019-03-08 DIAGNOSIS — T40691A Poisoning by other narcotics, accidental (unintentional), initial encounter: Secondary | ICD-10-CM | POA: Insufficient documentation

## 2019-03-08 DIAGNOSIS — F17210 Nicotine dependence, cigarettes, uncomplicated: Secondary | ICD-10-CM | POA: Insufficient documentation

## 2019-03-08 DIAGNOSIS — J45909 Unspecified asthma, uncomplicated: Secondary | ICD-10-CM | POA: Insufficient documentation

## 2019-03-08 DIAGNOSIS — F10920 Alcohol use, unspecified with intoxication, uncomplicated: Secondary | ICD-10-CM | POA: Insufficient documentation

## 2019-03-08 DIAGNOSIS — Z79899 Other long term (current) drug therapy: Secondary | ICD-10-CM | POA: Insufficient documentation

## 2019-03-08 DIAGNOSIS — D509 Iron deficiency anemia, unspecified: Secondary | ICD-10-CM | POA: Insufficient documentation

## 2019-03-08 DIAGNOSIS — T50901A Poisoning by unspecified drugs, medicaments and biological substances, accidental (unintentional), initial encounter: Secondary | ICD-10-CM

## 2019-03-08 DIAGNOSIS — Z794 Long term (current) use of insulin: Secondary | ICD-10-CM | POA: Insufficient documentation

## 2019-03-08 DIAGNOSIS — E119 Type 2 diabetes mellitus without complications: Secondary | ICD-10-CM | POA: Insufficient documentation

## 2019-03-08 LAB — COMPREHENSIVE METABOLIC PANEL
ALT: 25 U/L (ref 0–44)
AST: 53 U/L — ABNORMAL HIGH (ref 15–41)
Albumin: 3.4 g/dL — ABNORMAL LOW (ref 3.5–5.0)
Alkaline Phosphatase: 79 U/L (ref 38–126)
Anion gap: 16 — ABNORMAL HIGH (ref 5–15)
BUN: 12 mg/dL (ref 6–20)
CO2: 18 mmol/L — ABNORMAL LOW (ref 22–32)
Calcium: 9 mg/dL (ref 8.9–10.3)
Chloride: 105 mmol/L (ref 98–111)
Creatinine, Ser: 1.62 mg/dL — ABNORMAL HIGH (ref 0.44–1.00)
GFR calc Af Amer: 45 mL/min — ABNORMAL LOW (ref >60)
GFR calc non Af Amer: 39 mL/min — ABNORMAL LOW (ref >60)
Glucose, Bld: 105 mg/dL — ABNORMAL HIGH (ref 70–99)
Potassium: 3.2 mmol/L — ABNORMAL LOW (ref 3.5–5.1)
Sodium: 139 mmol/L (ref 135–145)
Total Bilirubin: 0.3 mg/dL (ref 0.3–1.2)
Total Protein: 6.7 g/dL (ref 6.5–8.1)

## 2019-03-08 LAB — CBC WITH DIFFERENTIAL/PLATELET
Abs Immature Granulocytes: 0.05 10*3/uL (ref 0.00–0.07)
Basophils Absolute: 0.1 10*3/uL (ref 0.0–0.1)
Basophils Relative: 1 %
Eosinophils Absolute: 0.3 10*3/uL (ref 0.0–0.5)
Eosinophils Relative: 2 %
HCT: 27.1 % — ABNORMAL LOW (ref 36.0–46.0)
Hemoglobin: 7.6 g/dL — ABNORMAL LOW (ref 12.0–15.0)
Immature Granulocytes: 0 %
Lymphocytes Relative: 23 %
Lymphs Abs: 2.7 10*3/uL (ref 0.7–4.0)
MCH: 19.7 pg — ABNORMAL LOW (ref 26.0–34.0)
MCHC: 28 g/dL — ABNORMAL LOW (ref 30.0–36.0)
MCV: 70.2 fL — ABNORMAL LOW (ref 80.0–100.0)
Monocytes Absolute: 0.7 10*3/uL (ref 0.1–1.0)
Monocytes Relative: 6 %
Neutro Abs: 7.9 10*3/uL — ABNORMAL HIGH (ref 1.7–7.7)
Neutrophils Relative %: 68 %
Platelets: 322 10*3/uL (ref 150–400)
RBC: 3.86 MIL/uL — ABNORMAL LOW (ref 3.87–5.11)
RDW: 23.1 % — ABNORMAL HIGH (ref 11.5–15.5)
WBC: 11.7 10*3/uL — ABNORMAL HIGH (ref 4.0–10.5)
nRBC: 0 % (ref 0.0–0.2)

## 2019-03-08 LAB — I-STAT BETA HCG BLOOD, ED (MC, WL, AP ONLY): I-stat hCG, quantitative: <5 m[IU]/mL (ref <5)

## 2019-03-08 LAB — ETHANOL: Alcohol, Ethyl (B): 150 mg/dL — ABNORMAL HIGH (ref <10)

## 2019-03-08 LAB — ACETAMINOPHEN LEVEL: Acetaminophen (Tylenol), Serum: <10 ug/mL — ABNORMAL LOW (ref 10–30)

## 2019-03-08 LAB — SALICYLATE LEVEL: Salicylate Lvl: <7 mg/dL (ref 2.8–30.0)

## 2019-03-08 LAB — CBG MONITORING, ED: Glucose-Capillary: 103 mg/dL — ABNORMAL HIGH (ref 70–99)

## 2019-03-08 MED ORDER — NALOXONE HCL 2 MG/2ML IJ SOSY: 2.0000 mg | PREFILLED_SYRINGE | Freq: Once | INTRAMUSCULAR | Status: DC

## 2019-03-08 MED ORDER — SODIUM CHLORIDE 0.9 % IV SOLN
1000.0000 mL | INTRAVENOUS | Status: DC
  Administered 2019-03-08: 1000 mL via INTRAVENOUS

## 2019-03-08 MED ORDER — NALOXONE HCL 2 MG/2ML IJ SOSY
PREFILLED_SYRINGE | INTRAMUSCULAR | Status: AC | PRN
  Administered 2019-03-08: 2 mg via INTRAVENOUS

## 2019-03-08 MED ORDER — FERROUS SULFATE 325 (65 FE) MG PO TABS: 325.0000 mg | ORAL_TABLET | Freq: Three times a day (TID) | ORAL | 0 refills | Status: DC

## 2019-03-08 MED ORDER — SODIUM CHLORIDE 0.9 % IV SOLN
INTRAVENOUS | Status: DC
  Administered 2019-03-08: 05:00:00 via INTRAVENOUS

## 2019-03-08 MED ORDER — SODIUM CHLORIDE 0.9 % IV BOLUS (SEPSIS)
1000.0000 mL | Freq: Once | INTRAVENOUS | Status: AC
  Administered 2019-03-08: 1000 mL via INTRAVENOUS

## 2019-03-08 NOTE — ED Provider Notes (Signed)
[facility] EMERGENCY DEPARTMENT Provider Note   CSN: [id number] Arrival date & time: 03/08/19  0435    History   Chief Complaint Chief Complaint  Patient presents with   Unresponsive    HPI Jill Shaw is a 43 y.o. female.     The history is provided by the EMS personnel. The history is limited by the condition of the patient.  Illness  Location:  At home Quality:  Took narcotics and zanaflex and alcohol together.   Severity:  Severe Onset quality:  Unable to specify Timing:  Constant Progression:  Unchanged Chronicity:  Recurrent Context:  Chronic pain syndrome Relieved by:  Narcan partially en route Worsened by:  Nothing  Ineffective treatments:  None tried Associated symptoms: no fever, no headaches and no shortness of breath   EMS reports call out for unresponsive children think she overdosed on her pain medication and alcohol and this has reportedly happened before.  Unresponsive but then given narcan with response but then became somnolent.  CBG was normal per EMS.  Is not responding on arrival =  Past Medical History:  Diagnosis Date   Anemia    Arthritis    knees, hands   Asthma    COPD (chronic obstructive pulmonary disease) (HCC)    Diabetes mellitus without complication ([place])    type 2   GERD (gastroesophageal reflux disease)    Hypertension    Neuromuscular disorder (HCC)    neuropathy feet   Seizures ([place])    pt states r/t stress and blood sugar - no meds last one 4 months ago, not seen neurologist   Sickle cell trait ([place])    Smoker     Patient Active Problem List   Diagnosis Date Noted   Hypoglycemia 02/07/2019   Hypotension 02/07/2019   Lactic acidosis 02/07/2019   Right ankle pain 02/07/2019   Chronic obstructive pulmonary disease ([place]) 02/05/2019   Seizure ([place]) 07/31/2018   Menorrhagia with irregular cycle 05/17/2017   Anemia of chronic disease 04/06/2017   Knee pain, chronic 03/27/2016     Type 2 diabetes mellitus without complication, with long-term current use of insulin ([place]) 03/27/2016   Essential hypertension 03/27/2016   Morbid obesity ([place]) 03/27/2016   Irritable bowel syndrome with constipation 03/27/2016   Tobacco dependence 03/27/2016    Past Surgical History:  Procedure Laterality Date   CESAREAN SECTION     x 1   EYE SURGERY Bilateral    laser right and cataract removed left eye   UPPER GI ENDOSCOPY  07/2017     OB History   No obstetric history on file.      Home Medications    Prior to Admission medications   Medication Sig Start Date End Date Taking? Authorizing Provider  albuterol (PROVENTIL HFA;VENTOLIN HFA) 108 (90 Base) MCG/ACT inhaler Inhale 2 puffs into the lungs every 6 (six) hours as needed for wheezing or shortness of breath. 12/05/18   Lanae Boast, FNP  albuterol (PROVENTIL) (2.5 MG/3ML) 0.083% nebulizer solution Take 3 mLs (2.5 mg total) by nebulization every 6 (six) hours as needed for wheezing or shortness of breath. 07/31/18   Lanae Boast, FNP  amLODipine (NORVASC) 5 MG tablet Take 1 tablet (5 mg total) by mouth daily. 12/05/18   Lanae Boast, FNP  Blood Glucose Monitoring Suppl (TRUE METRIX METER) w/Device KIT 1 each by Does not apply route 4 (four) times daily -  before meals and at bedtime. 01/21/18   Dorena Dew, FNP  budesonide-formoterol (SYMBICORT) 160-4.5 MCG/ACT inhaler Inhale 2 puffs into the lungs 2 (two) times daily. 01/07/18   Dorena Dew, FNP  cholecalciferol (VITAMIN D) 1000 units tablet Take 1,000 Units by mouth daily.    [provider]  ferrous sulfate 300 (60 Fe) MG/5ML syrup Take 5 mLs (300 mg total) by mouth daily. 10/23/18   Lanae Boast, FNP  fluticasone (FLONASE) 50 MCG/ACT nasal spray Place 2 sprays into both nostrils daily. 12/05/18   Lanae Boast, FNP  furosemide (LASIX) 40 MG tablet Take 1 tablet (40 mg total) by mouth daily. 12/09/18   Lanae Boast, FNP  glucose blood (TRUE  METRIX BLOOD GLUCOSE TEST) test strip Use as instructed 01/21/18   Dorena Dew, FNP  Insulin Lispro Prot & Lispro (HUMALOG MIX 75/25 KWIKPEN) (75-25) 100 UNIT/ML Kwikpen INJECT 100 UNITS EVERY 12 HOURS 03/06/19   Lanae Boast, FNP  Insulin Pen Needle (PEN NEEDLES) 30G X 5 MM MISC 1 Units by Does not apply route 2 (two) times a day. 03/06/19   Lanae Boast, FNP  losartan (COZAAR) 100 MG tablet Take 1 tablet (100 mg total) by mouth daily. 12/05/18   Lanae Boast, FNP  metoprolol succinate (TOPROL-XL) 25 MG 24 hr tablet Take 0.5 tablets (12.5 mg total) by mouth daily. 12/05/18   Lanae Boast, FNP  omeprazole (PRILOSEC) 40 MG capsule Take 1 capsule (40 mg total) by mouth daily. 12/20/17   Dorena Dew, FNP  potassium chloride SA (K-DUR) 20 MEQ tablet Take 1 tablet (20 mEq total) by mouth daily. 03/06/19   Lanae Boast, FNP  pregabalin (LYRICA) 75 MG capsule Take 1 capsule (75 mg total) by mouth 2 (two) times daily. 10/18/17   Charlott Rakes, MD  saxagliptin HCl (ONGLYZA) 2.5 MG TABS tablet Take 1 tablet (2.5 mg total) by mouth daily. 12/05/18   Lanae Boast, FNP    Family History Family History  Problem Relation Age of Onset   Diabetes Mother    Hypertension Mother     Social History Social History   Tobacco Use   Smoking status: Current Every Day Smoker    Packs/day: 0.25    Years: 15.00    Pack years: 3.75    Types: Cigarettes   Smokeless tobacco: Never Used  Substance Use Topics   Alcohol use: No   Drug use: No     Allergies   Ketoprofen; Aspirin; Cortizone-10 [hydrocortisone]; Gabapentin; Ibuprofen; Liraglutide; Naproxen; Omeprazole-sodium bicarbonate; Other; Sulfa antibiotics; and Tramadol   Review of Systems Review of Systems  Unable to perform ROS: Mental status change  Constitutional: Negative for fever.  Respiratory: Negative for shortness of breath.   Neurological: Negative for headaches.  Psychiatric/Behavioral: Negative for hallucinations and  self-injury. The patient is not nervous/anxious.      Physical Exam Updated Vital Signs BP (!) 111/37    Pulse 92    Temp (!) 96.7 F (35.9 C) (Oral)    Resp 16    SpO2 99%   Physical Exam Vitals signs and nursing note reviewed.  Constitutional:      Appearance: She is obese.     Comments: Smells of ETOH not responding prior to narcan (gcs prior 7)  HENT:     Head: Normocephalic and atraumatic.     Right Ear: Tympanic membrane normal.     Left Ear: Tympanic membrane normal.     Nose: Nose normal.     Mouth/Throat:     Mouth: Mucous membranes are moist.     Pharynx: Oropharynx  is clear.  Eyes:     Comments: Pinpoint   Neck:     Musculoskeletal: Normal range of motion and neck supple.  Cardiovascular:     Rate and Rhythm: Normal rate and regular rhythm.     Pulses: Normal pulses.     Heart sounds: Normal heart sounds.  Pulmonary:     Effort: Pulmonary effort is normal.     Breath sounds: Normal breath sounds.  Abdominal:     General: Abdomen is flat. Bowel sounds are normal.     Tenderness: There is no abdominal tenderness. There is no guarding or rebound.  Musculoskeletal: Normal range of motion.  Skin:    General: Skin is warm and dry.     Capillary Refill: Capillary refill takes less than 2 seconds.  Neurological:     Deep Tendon Reflexes: Reflexes normal.      ED Treatments / Results  Labs (all labs ordered are listed, but only abnormal results are displayed) Results for orders placed or performed during the hospital encounter of 03/08/19  CBC WITH DIFFERENTIAL  Result Value Ref Range   WBC 11.7 (H) 4.0 - 10.5 K/uL   RBC 3.86 (L) 3.87 - 5.11 MIL/uL   Hemoglobin 7.6 (L) 12.0 - 15.0 g/dL   HCT 27.1 (L) 36.0 - 46.0 %   MCV 70.2 (L) 80.0 - 100.0 fL   MCH 19.7 (L) 26.0 - 34.0 pg   MCHC 28.0 (L) 30.0 - 36.0 g/dL   RDW 23.1 (H) 11.5 - 15.5 %   Platelets 322 150 - 400 K/uL   nRBC 0.0 0.0 - 0.2 %   Neutrophils Relative % PENDING %   Neutro Abs PENDING 1.7 - 7.7  K/uL   Band Neutrophils PENDING %   Lymphocytes Relative PENDING %   Lymphs Abs PENDING 0.7 - 4.0 K/uL   Monocytes Relative PENDING %   Monocytes Absolute PENDING 0.1 - 1.0 K/uL   Eosinophils Relative PENDING %   Eosinophils Absolute PENDING 0.0 - 0.5 K/uL   Basophils Relative PENDING %   Basophils Absolute PENDING 0.0 - 0.1 K/uL   WBC Morphology PENDING    RBC Morphology PENDING    Smear Review PENDING    Other PENDING %   nRBC PENDING 0 /100 WBC   Metamyelocytes Relative PENDING %   Myelocytes PENDING %   Promyelocytes Relative PENDING %   Blasts PENDING %  CBG monitoring, ED  Result Value Ref Range   Glucose-Capillary 103 (H) 70 - 99 mg/dL  I-Stat beta hCG blood, ED  Result Value Ref Range   I-stat hCG, quantitative <5.0 <5 mIU/mL   Comment 3           Dg Ankle Complete Right  Result Date: 02/07/2019 CLINICAL DATA:  43 year old female with fall, swelling and pain. EXAM: RIGHT ANKLE - COMPLETE 3+ VIEW COMPARISON:  Right ankle series 05/07/2017. FINDINGS: Preserved mortise joint alignment. Talar dome intact. No joint effusion identified. Chronic pes planus and calcaneus degenerative spurring. The distal tibia, fibula, and calcaneus appear intact. Visible bones of the right foot appear intact. Generalized soft tissue swelling but no discrete soft tissue injury. IMPRESSION: No acute fracture or dislocation identified about the right ankle. Electronically Signed   By: Genevie Ann M.D.   On: 02/07/2019 09:28   Ct Head Wo Contrast  Result Date: 02/07/2019 CLINICAL DATA:  43 year old female is unresponsive. Hypoglycemia and hypotension. EXAM: CT HEAD WITHOUT CONTRAST TECHNIQUE: Contiguous axial images were obtained from the base of the  skull through the vertex without intravenous contrast. COMPARISON:  Head and cervical spine CT 10/20/2018. FINDINGS: Brain: Stable cerebral volume. No midline shift, ventriculomegaly, mass effect, evidence of mass lesion, intracranial hemorrhage or evidence of  cortically based acute infarction. Gray-white matter differentiation is within normal limits throughout the brain. Vascular: Mild Calcified atherosclerosis at the skull base. No suspicious intracranial vascular hyperdensity. Skull: Intact.  No acute osseous abnormality identified. Sinuses/Orbits: Visualized paranasal sinuses and mastoids are stable and well pneumatized. Other: No acute orbit or scalp soft tissue findings. IMPRESSION: Stable and negative noncontrast CT appearance of the brain. Electronically Signed   By: Genevie Ann M.D.   On: 02/07/2019 06:28   Mr Brain Wo Contrast  Result Date: 02/14/2019 CLINICAL DATA:  Frequent headaches EXAM: MRI HEAD WITHOUT CONTRAST TECHNIQUE: Multiplanar, multiecho pulse sequences of the brain and surrounding structures were obtained without intravenous contrast. COMPARISON:  Head CT 02/07/2019 FINDINGS: BRAIN: There is no acute infarct, acute hemorrhage or extra-axial collection. The midline structures are normal. No midline shift or other mass effect. The white matter signal is normal for the patient's age. The cerebral and cerebellar volume are age-appropriate. No hydrocephalus. Susceptibility-sensitive sequences show no chronic microhemorrhage or superficial siderosis. No mass lesion. VASCULAR: The major intracranial arterial and venous sinus flow voids are normal. SKULL AND UPPER CERVICAL SPINE: Calvarial bone marrow signal is normal. There is no skull base mass. Visualized upper cervical spine and soft tissues are normal. SINUSES/ORBITS: No fluid levels or advanced mucosal thickening. No mastoid or middle ear effusion. The orbits are normal. IMPRESSION: Normal brain MRI. Electronically Signed   By: Ulyses Jarred M.D.   On: 02/14/2019 13:36   Dg Chest Port 1 View  Result Date: 02/07/2019 CLINICAL DATA:  43 year old female is unresponsive. Hypoglycemia and hypotension. EXAM: PORTABLE CHEST 1 VIEW COMPARISON:  Chest radiographs 10/20/2018 and earlier. FINDINGS: Portable  AP upright view at 0648 hours. Low lung volumes and lordotic positioning. Mild cardiomegaly appears stable. Other mediastinal contours are within normal limits. Visualized tracheal air column is within normal limits. Allowing for portable technique the lungs are clear. No pneumothorax. Paucity of bowel gas in the upper abdomen. No acute osseous abnormality identified. IMPRESSION: No acute cardiopulmonary abnormality. Electronically Signed   By: Genevie Ann M.D.   On: 02/07/2019 06:26    EKG  EKG Interpretation  Date/Time:  Saturday Mar 08 2019 04:50:20 EDT Ventricular Rate:  83 PR Interval:    QRS Duration: 94 QT Interval:  428 QTC Calculation: 503 R Axis:   51 Text Interpretation:  Sinus rhythm Low voltage, precordial leads Borderline repolarization abnormality prolonged QT Confirmed by Randal Buba, Samaa Ueda ([id number]) on 03/08/2019 5:35:03 AM       Radiology No results found.  Procedures Procedures (including critical care time)  Medications Ordered in ED Medications  0.9 %  sodium chloride infusion ( Intravenous New Bag/Given 03/08/19 0458)  naloxone [facility]) injection 2 mg (2 mg Intravenous Not Given 03/08/19 0452)  sodium chloride 0.9 % bolus 1,000 mL (1,000 mLs Intravenous New Bag/Given 03/08/19 6010)    Followed by  0.9 %  sodium chloride infusion (1,000 mLs Intravenous New Bag/Given 03/08/19 0613)  naloxone [facility]) injection (2 mg Intravenous Given 03/08/19 0438)    Given narcan with immediate effect.  Awake alert and conversant.  Patient is mad stating she has chronic pain.  She denies SI or HI states she takes medication for her pain.  No one will fix her pain.  Denies.  CP denies SOB, No n/v/d.  No f/c/r.    Is clinically sober at this time.  Is denying intentional overdose.  Alert and oriented x 4.  Is ambulatory.   Final Clinical Impressions(s) / ED Diagnoses      Return for intractable cough, coughing up blood,fevers >100.4 unrelieved by medication, shortness of breath,  intractable vomiting, chest pain, shortness of breath, weakness,numbness, changes in speech, facial asymmetry,abdominal pain, passing out,Inability to tolerate liquids or food, cough, altered mental status or any concerns. No signs of systemic illness or infection. The patient is nontoxic-appearing on exam and vital signs are within normal limits.   I have reviewed the triage vital signs and the nursing notes. Pertinent labs &imaging results that were available during my care of the patient were reviewed by me and considered in my medical decision making (see chart for details).  After history, exam, and medical workup I feel the patient has been appropriately medically screened and is safe for discharge home. Pertinent diagnoses were discussed with the patient. Patient was given return precautions    ED Discharge Orders       Gay Rape, MD 03/08/19 [phone]

## 2019-03-08 NOTE — ED Notes (Signed)
Pt tolerated water

## 2019-03-08 NOTE — ED Notes (Signed)
Pt states that wants to leave AMA and we are wasting her time

## 2019-03-08 NOTE — Code Documentation (Signed)
Pt now responsive and talking stating that she wants to sign out AMA

## 2019-03-08 NOTE — ED Triage Notes (Signed)
Pt comes via [facility] after being found by family unresponsive with snoring respirations, BP 81/34, 0.5 narcan given PTA, heavy ETOH on board

## 2019-03-08 NOTE — ED Notes (Signed)
Pt ambulated in room with the assistance of a walker. Stated that her legs were weak from lying in bed.

## 2019-03-11 ENCOUNTER — Other Ambulatory Visit: Payer: Self-pay

## 2019-03-11 MED ORDER — OMEPRAZOLE 40 MG PO CPDR: 40.0000 mg | DELAYED_RELEASE_CAPSULE | Freq: Every day | ORAL | 3 refills | Status: DC

## 2019-03-14 MED FILL — ?OMEPRAZOLE 20 MG CAPSULE D: 20 | 30 days supply | Qty: 60 | Fill #0

## 2019-03-14 MED FILL — ?POTASSIUM CL ER 10 MEQ TAB: 10 MEQ | 30 days supply | Qty: 60 | Fill #0

## 2019-03-17 MED FILL — FERROUS SULFATE 325 MG TAB: 325 (65 FE) | 30 days supply | Qty: 90 | Fill #0

## 2019-03-17 MED FILL — ?HUMALOG MIX 75-25 KWIKPEN: (75-25) 100 | 28 days supply | Qty: 60 | Fill #1

## 2019-03-20 ENCOUNTER — Other Ambulatory Visit: Payer: Self-pay

## 2019-03-20 ENCOUNTER — Encounter: Payer: Self-pay | Admitting: Family Medicine

## 2019-03-20 ENCOUNTER — Ambulatory Visit (INDEPENDENT_AMBULATORY_CARE_PROVIDER_SITE_OTHER): Payer: Self-pay | Admitting: Family Medicine

## 2019-03-20 VITALS — BP 141/65 | HR 96 | Temp 99.2°F | Resp 18 | Ht 64.0 in | Wt 274.0 lb

## 2019-03-20 DIAGNOSIS — L309 Dermatitis, unspecified: Secondary | ICD-10-CM

## 2019-03-20 MED ORDER — CLINDAMYCIN PHOSPHATE 1 % EX LOTN: TOPICAL_LOTION | Freq: Two times a day (BID) | CUTANEOUS | 0 refills | Status: DC

## 2019-03-20 MED ORDER — CETIRIZINE HCL 10 MG PO TABS: 10.0000 mg | ORAL_TABLET | Freq: Every day | ORAL | 11 refills | Status: DC

## 2019-03-20 MED FILL — CLINDAMYCIN PHOSP 1% LOTION: 1 | 30 days supply | Qty: 60 | Fill #0

## 2019-03-20 MED FILL — ?CETIRIZINE HCL 10 MG TABLE: 10 | 30 days supply | Qty: 30 | Fill #0

## 2019-03-20 NOTE — Patient Instructions (Signed)
Use this medication after washing your face with a mild cleanser. You can combine this with cetaphil lotion.  This is a topical antibiotic.   Clindamycin Phosphate Topical Lotion What is this medicine? CLINDAMYCIN (KLIN da MYE sin) is a lincosamide antibiotic. It is used on the skin to stop the growth of certain bacteria that cause acne. This medicine may be used for other purposes; ask your health care provider or pharmacist if you have questions. COMMON BRAND NAME(S): Cleocin T, ClindaMax What should I tell my health care provider before I take this medicine? They need to know if you have any of these conditions: -diarrhea -inflammatory bowel disease -kidney or liver disease -stomach problems like colitis -an unusual or allergic reaction to clindamycin, lincomycin, other medicines, foods, dyes or preservatives -pregnant or trying to get pregnant -breast-feeding How should I use this medicine? This medicine is for external use only. Wash hands before and after use. Wash affected area and gently pat dry. Shake well before use. Apply a thin layer of this medicine to the affected area as often as prescribed by your doctor or health care professional. Do not use this medicine near the eyes, nose, or mouth. If you do get any in your eyes rinse out with plenty of cool tap water. Take all of your medicine as directed even if you think your are better. Do not skip doses or stop your medicine early. Talk to your pediatrician regarding the use of this medicine in children. Special care may be needed. While this medicine may be prescribed for children as young as 12 years for selected conditions, precautions do apply. Overdosage: If you think you have taken too much of this medicine contact a poison control center or emergency room at once. NOTE: This medicine is only for you. Do not share this medicine with others. What if I miss a dose? If you miss a dose, use it as soon as you can. If it is almost  time for your next dose, use only that dose. Do not use double or extra doses. What may interact with this medicine? -medicated cosmetics, including cover up preparations -other acne products including benzoyl peroxide, salicylic acid, or tretinoin -skin care products that have a high alcohol content (some shaving creams, lotions, or after-shave lotion) -some skin cleansers or medicated soaps This list may not describe all possible interactions. Give your health care provider a list of all the medicines, herbs, non-prescription drugs, or dietary supplements you use. Also tell them if you smoke, drink alcohol, or use illegal drugs. Some items may interact with your medicine. What should I watch for while using this medicine? Your acne should start to get better within about 6 weeks. Complete improvement may take longer. Tell your doctor or health care professional if you do not see any improvement. Your skin may get very dry and scale or peel. Let your doctor or health care professional know if this happens. Do not use any soothing cream or ointment without advice. What side effects may I notice from receiving this medicine? Side effects that you should report to your doctor or health care professional as soon as possible: -allergic reactions like skin rash, itching or hives, swelling of the face, lips, or tongue -diarrhea that is watery or severe -pain on swallowing -stomach pain or cramps -unusual bleeding or bruising Side effects that usually do not require medical attention (report to your doctor or health care professional if they continue or are bothersome): -dry skin -nausea, vomiting  This list may not describe all possible side effects. Call your doctor for medical advice about side effects. You may report side effects to FDA at 1-800-FDA-1088. Where should I keep my medicine? Keep out of the reach of children. Store at controlled room temperature between 20 and 25 degrees C (68 and 77  degrees F). Do not freeze. This medicine is flammable. Avoid exposure to heat, fire, flame, and smoking. Throw away any unused medicine after the expiration date. NOTE: This sheet is a summary. It may not cover all possible information. If you have questions about this medicine, talk to your doctor, pharmacist, or health care provider.  2019 Elsevier/Gold Standard (2013-05-08 16:16:30)

## 2019-03-20 NOTE — Progress Notes (Signed)
  [facility]   Progress Note: Sick Visit Provider: Mike Gip, FNP  SUBJECTIVE:   Jill Shaw is a 43 y.o. female who  has a past medical history of Anemia, Arthritis, Asthma, COPD (chronic obstructive pulmonary disease) (HCC), Diabetes mellitus without complication (HCC), GERD (gastroesophageal reflux disease), Hypertension, Neuromuscular disorder (HCC), Seizures (HCC), Sickle cell trait (HCC), and Smoker.. Patient presents today for Rash (itching rash on face ) Patient states that this has been present for several years. She states that she uses cetaphil lotion after washing her face with cold water. She denies a history of eczema but states that her face itches and is scaly. The itching is relieved by taking benadryl. She states that she has very sensitive skin and is requesting an antibiotic.  Review of Systems  Skin: Positive for itching and rash.  All other systems reviewed and are negative.    OBJECTIVE: BP (!) 141/65 (BP Location: Left Arm, Patient Position: Sitting, Cuff Size: Large)   Pulse 96   Temp 99.2 F (37.3 C) (Oral)   Resp 18   Ht 5\' 4"  (1.626 m)   Wt 274 lb (124.3 kg)   LMP 03/01/2019   SpO2 100%   BMI 47.03 kg/m   Wt Readings from Last 3 Encounters:  03/20/19 274 lb (124.3 kg)  02/07/19 277 lb 12.5 oz (126 kg)  02/05/19 277 lb (125.6 kg)     Physical Exam Constitutional:      General: She is not in acute distress.    Appearance: Normal appearance.  HENT:     Head: Normocephalic and atraumatic.  Skin:    General: Skin is dry.     Findings: Rash present. Rash is scaling (noted on face).  Neurological:     Mental Status: She is alert and oriented to person, place, and time.     ASSESSMENT/PLAN:   1. Dermatitis Discussed washing face with a mild cleanser and moisturizing with a gentle lotion. D/C using vaseline with cocoa butter on face as this can clog pores.  - clindamycin (CLEOCIN-T) 1 %  lotion; Apply topically 2 (two) times daily.  Dispense: 60 mL; Refill: 0 - cetirizine (ZYRTEC) 10 MG tablet; Take 1 tablet (10 mg total) by mouth daily.  Dispense: 30 tablet; Refill: 11        The patient was given clear instructions to go to ER or return to medical center if symptoms do not improve, worsen or new problems develop. The patient verbalized understanding and agreed with plan of care.   Ms. [name]. [name], FNP-BC [facility] [address] [place], [place] [address] [phone]     This note has been created with Dragon speech recognition software and smart phrase technology. Any transcriptional errors are unintentional.

## 2019-03-25 ENCOUNTER — Telehealth: Payer: Self-pay

## 2019-03-25 NOTE — Telephone Encounter (Signed)
Called patient, She is asking if you can order the MRI on her legs? Please advise. Thanks!

## 2019-03-26 MED FILL — ?FUROSEMIDE 40 MG TABLET: 40 | 30 days supply | Qty: 30 | Fill #2

## 2019-03-26 MED FILL — ?AMLODIPINE BESYLATE 5MG TA: 5 | 30 days supply | Qty: 30 | Fill #3

## 2019-04-17 ENCOUNTER — Other Ambulatory Visit: Payer: Self-pay

## 2019-04-17 ENCOUNTER — Ambulatory Visit (INDEPENDENT_AMBULATORY_CARE_PROVIDER_SITE_OTHER): Payer: Self-pay | Admitting: Family Medicine

## 2019-04-17 ENCOUNTER — Encounter: Payer: Self-pay | Admitting: Family Medicine

## 2019-04-17 VITALS — BP 148/68 | HR 100 | Temp 98.0°F | Resp 18 | Ht 64.0 in | Wt 276.0 lb

## 2019-04-17 DIAGNOSIS — H1032 Unspecified acute conjunctivitis, left eye: Secondary | ICD-10-CM

## 2019-04-17 MED ORDER — ERYTHROMYCIN 5 MG/GM OP OINT: 1.0000 "application " | TOPICAL_OINTMENT | Freq: Every day | OPHTHALMIC | 0 refills | Status: AC

## 2019-04-17 MED FILL — ERYTHROMYCIN EYE OINTMENT: 5 | 7 days supply | Qty: 7 | Fill #0

## 2019-04-17 NOTE — Progress Notes (Signed)
  Patient [facility]   Progress Note: Sick Visit Provider: Lanae Boast, FNP  SUBJECTIVE:   Jill Shaw is a 43 y.o. female who  has a past medical history of Anemia, Arthritis, Asthma, COPD (chronic obstructive pulmonary disease) ([place]), Diabetes mellitus without complication ([place]), GERD (gastroesophageal reflux disease), Hypertension, Neuromuscular disorder ([place]), Seizures ([place]), Sickle cell trait ([place]), and Smoker.. Patient presents today for Eye Pain (left eye pain and watering )  Eye Pain  The left eye is affected. This is a recurrent problem. The current episode started 1 to 4 weeks ago. The problem has been unchanged. There was no injury mechanism. The pain is at a severity of 5/10. The pain is moderate. Associated symptoms include an eye discharge and photophobia. Pertinent negatives include no blurred vision. She has tried nothing for the symptoms.    Review of Systems  Eyes: Positive for photophobia, pain and discharge. Negative for blurred vision.     OBJECTIVE: BP (!) 148/68 (BP Location: Left Arm, Patient Position: Sitting, Cuff Size: Large)   Pulse 100   Temp 98 F (36.7 C) (Oral)   Resp 18   Ht 5\' 4"  (1.626 m)   Wt 276 lb (125.2 kg)   LMP 03/21/2019   SpO2 100%   BMI 47.38 kg/m   Wt Readings from Last 3 Encounters:  04/17/19 276 lb (125.2 kg)  03/20/19 274 lb (124.3 kg)  02/07/19 277 lb 12.5 oz (126 kg)     Physical Exam Vitals signs and nursing note reviewed.  Constitutional:      General: She is not in acute distress.    Appearance: Normal appearance. She is obese.  HENT:     Head: Normocephalic and atraumatic.  Eyes:     General: Lids are normal.     Extraocular Movements: Extraocular movements intact.     Conjunctiva/sclera:     Left eye: Left conjunctiva is injected. Exudate present.  Neurological:     Mental Status: She is alert.     ASSESSMENT/PLAN:  1. Acute conjunctivitis of left eye,  unspecified acute conjunctivitis type - erythromycin ophthalmic ointment; Place 1 application into the left eye at bedtime for 7 days.  Dispense: 7 g; Refill: 0         The patient was given clear instructions to go to ER or return to medical center if symptoms do not improve, worsen or new problems develop. The patient verbalized understanding and agreed with plan of care.   Jill Shaw. Nathaneil Canary, FNP-BC Patient [facility] [address] [place], [place] [address] [phone]     This note has been created with Dragon speech recognition software and smart phrase technology. Any transcriptional errors are unintentional.

## 2019-04-17 NOTE — Patient Instructions (Signed)
Bacterial Conjunctivitis, Adult Bacterial conjunctivitis is an infection of your conjunctiva. This is the clear membrane that covers the white part of your eye and the inner part of your eyelid. This infection can make your eye:  Red or pink.  Itchy. This condition spreads easily from person to person (is contagious) and from one eye to the other eye. What are the causes?  This condition is caused by germs (bacteria). You may get the infection if you come into close contact with: ? A person who has the infection. ? Items that have germs on them (are contaminated), such as face towels, contact lens solution, or eye makeup. What increases the risk? You are more likely to get this condition if you:  Have contact with people who have the infection.  Wear contact lenses.  Have a sinus infection.  Have had a recent eye injury or surgery.  Have a weak body defense system (immune system).  Have dry eyes. What are the signs or symptoms?   Thick, yellowish discharge from the eye.  Tearing or watery eyes.  Itchy eyes.  Burning feeling in your eyes.  Eye redness.  Swollen eyelids.  Blurred vision. How is this treated?   Antibiotic eye drops or ointment.  Antibiotic medicine taken by mouth. This is used for infections that do not get better with drops or ointment or that last more than 10 days.  Cool, wet cloths placed on the eyes.  Artificial tears used 2-6 times a day. Follow these instructions at home: Medicines  Take or apply your antibiotic medicine as told by your doctor. Do not stop taking or applying the antibiotic even if you start to feel better.  Take or apply over-the-counter and prescription medicines only as told by your doctor.  Do not touch your eyelid with the eye-drop bottle or the ointment tube. Managing discomfort  Wipe any fluid from your eye with a warm, wet washcloth or a cotton ball.  Place a clean, cool, wet cloth on your eye. Do this for  10-20 minutes, 3-4 times per day. General instructions  Do not wear contacts until the infection is gone. Wear glasses until your doctor says it is okay to wear contacts again.  Do not wear eye makeup until the infection is gone. Throw away old eye makeup.  Change or wash your pillowcase every day.  Do not share towels or washcloths.  Wash your hands often with soap and water. Use paper towels to dry your hands.  Do not touch or rub your eyes.  Do not drive or use heavy machinery if your vision is blurred. Contact a doctor if:  You have a fever.  You do not get better after 10 days. Get help right away if:  You have a fever and your symptoms get worse all of a sudden.  You have very bad pain when you move your eye.  Your face: ? Hurts. ? Is red. ? Is swollen.  You have sudden loss of vision. Summary  Bacterial conjunctivitis is an infection of your conjunctiva.  This infection spreads easily from person to person.  Wash your hands often with soap and water. Use paper towels to dry your hands.  Take or apply your antibiotic medicine as told by your doctor.  Contact a doctor if you have a fever or you do not get better after 10 days. This information is not intended to replace advice given to you by your health care provider. Make sure you discuss any   questions you have with your health care provider. Document Released: 07/11/2008 Document Revised: 01/21/2019 Document Reviewed: 05/08/2018 Elsevier Patient Education  2020 [employer]  

## 2019-04-24 ENCOUNTER — Telehealth: Payer: Self-pay

## 2019-04-24 NOTE — Telephone Encounter (Signed)
Called patient, she states the clindamycin Lotion for dermatitis on her face is not helping. She is asking if something else can be tried. Please advise. Thanks!

## 2019-04-25 NOTE — Telephone Encounter (Signed)
She will need to try the medication for at least 6 weeks consistently prior to making any changes.

## 2019-04-25 NOTE — Telephone Encounter (Signed)
Called, no answer. Left a message for patient to call back.  

## 2019-04-28 ENCOUNTER — Other Ambulatory Visit: Payer: Self-pay

## 2019-04-28 DIAGNOSIS — L309 Dermatitis, unspecified: Secondary | ICD-10-CM

## 2019-04-28 MED ORDER — CLINDAMYCIN PHOSPHATE 1 % EX LOTN: TOPICAL_LOTION | Freq: Two times a day (BID) | CUTANEOUS | 0 refills | Status: DC

## 2019-04-28 MED FILL — CLINDAMYCIN PHOSP 1% LOTION: 1 | 30 days supply | Qty: 60 | Fill #0

## 2019-04-28 NOTE — Telephone Encounter (Signed)
Called no answer. Left a message that she needs to be on med for 6 weeks consistently prior to making any changes. Thanks!

## 2019-05-09 ENCOUNTER — Telehealth: Payer: Self-pay

## 2019-05-09 MED FILL — CLINDAMYCIN PHOSP 1% LOTION: 1 | 30 days supply | Qty: 60 | Fill #0

## 2019-05-09 NOTE — Telephone Encounter (Signed)
Patient called and is asking if she can get put back on tizanidine due to having muscle spasms in legs. Please advise if this can be done. Thanks !

## 2019-05-12 ENCOUNTER — Telehealth: Payer: Self-pay

## 2019-05-12 NOTE — Telephone Encounter (Signed)
Patient called and states she was approved for 100% financial through [facility] and wants to know if you can order MRI for her legs? Please advise. Thanks!

## 2019-05-12 NOTE — Telephone Encounter (Signed)
Denied. We have discussed the reason in depth in the past office visits.

## 2019-05-12 NOTE — Telephone Encounter (Signed)
Called, no answer. Left a message that tizanidine has been denied and asked if she has any questions to call and make an appointment to discuss with provider. Thanks!

## 2019-05-14 MED FILL — ?CETIRIZINE HCL 10 MG TABLE: 10 | 30 days supply | Qty: 30 | Fill #1

## 2019-05-14 MED FILL — ?AMLODIPINE BESYLATE 5MG TA: 5 | 30 days supply | Qty: 30 | Fill #4

## 2019-05-14 MED FILL — LOSARTAN POTASSIUM 100 MG T: 100 | 30 days supply | Qty: 30 | Fill #1

## 2019-05-15 ENCOUNTER — Institutional Professional Consult (permissible substitution): Payer: Self-pay | Admitting: Pulmonary Disease

## 2019-05-15 ENCOUNTER — Telehealth: Payer: Self-pay

## 2019-05-15 DIAGNOSIS — M549 Dorsalgia, unspecified: Secondary | ICD-10-CM

## 2019-05-15 DIAGNOSIS — M542 Cervicalgia: Secondary | ICD-10-CM

## 2019-05-15 DIAGNOSIS — G8929 Other chronic pain: Secondary | ICD-10-CM

## 2019-05-15 NOTE — Telephone Encounter (Signed)
Patient does not want to have spine done first. She is asking to have legs and feet done first. Please advise.

## 2019-05-15 NOTE — Telephone Encounter (Signed)
Called, no answer. Left a message for patient to call back. Thanks!  

## 2019-05-15 NOTE — Telephone Encounter (Signed)
Can place order for the spine for the patient if she is agreeable.

## 2019-05-16 NOTE — Telephone Encounter (Signed)
Called and scheduled an appointment and patient was aware. Thanks!

## 2019-05-22 ENCOUNTER — Other Ambulatory Visit: Payer: Self-pay

## 2019-05-22 ENCOUNTER — Emergency Department (HOSPITAL_COMMUNITY)
Admission: EM | Admit: 2019-05-22 | Discharge: 2019-05-23 | Disposition: A | Discharge status: Home / Self Care | Payer: Self-pay | Attending: Emergency Medicine | Admitting: Emergency Medicine

## 2019-05-22 DIAGNOSIS — R1032 Left lower quadrant pain: Secondary | ICD-10-CM | POA: Insufficient documentation

## 2019-05-22 DIAGNOSIS — Z5321 Procedure and treatment not carried out due to patient leaving prior to being seen by health care provider: Secondary | ICD-10-CM | POA: Insufficient documentation

## 2019-05-22 DIAGNOSIS — R112 Nausea with vomiting, unspecified: Secondary | ICD-10-CM | POA: Insufficient documentation

## 2019-05-22 LAB — COMPREHENSIVE METABOLIC PANEL
ALT: 45 U/L — ABNORMAL HIGH (ref 0–44)
AST: 119 U/L — ABNORMAL HIGH (ref 15–41)
Albumin: 3.8 g/dL (ref 3.5–5.0)
Alkaline Phosphatase: 96 U/L (ref 38–126)
Anion gap: 14 (ref 5–15)
BUN: 13 mg/dL (ref 6–20)
CO2: 20 mmol/L — ABNORMAL LOW (ref 22–32)
Calcium: 9.1 mg/dL (ref 8.9–10.3)
Chloride: 104 mmol/L (ref 98–111)
Creatinine, Ser: 1.52 mg/dL — ABNORMAL HIGH (ref 0.44–1.00)
GFR calc Af Amer: 49 mL/min — ABNORMAL LOW (ref >60)
GFR calc non Af Amer: 42 mL/min — ABNORMAL LOW (ref >60)
Glucose, Bld: 215 mg/dL — ABNORMAL HIGH (ref 70–99)
Potassium: 3.8 mmol/L (ref 3.5–5.1)
Sodium: 138 mmol/L (ref 135–145)
Total Bilirubin: 0.4 mg/dL (ref 0.3–1.2)
Total Protein: 7.2 g/dL (ref 6.5–8.1)

## 2019-05-22 LAB — URINALYSIS, ROUTINE W REFLEX MICROSCOPIC
Bilirubin Urine: NEGATIVE
Glucose, UA: 150 mg/dL — AB
Hgb urine dipstick: NEGATIVE
Ketones, ur: NEGATIVE mg/dL
Leukocytes,Ua: NEGATIVE
Nitrite: NEGATIVE
Protein, ur: 100 mg/dL — AB
Specific Gravity, Urine: 1.012 (ref 1.005–1.030)
pH: 5 (ref 5.0–8.0)

## 2019-05-22 LAB — CBC
HCT: 31.1 % — ABNORMAL LOW (ref 36.0–46.0)
Hemoglobin: 8.9 g/dL — ABNORMAL LOW (ref 12.0–15.0)
MCH: 20.9 pg — ABNORMAL LOW (ref 26.0–34.0)
MCHC: 28.6 g/dL — ABNORMAL LOW (ref 30.0–36.0)
MCV: 73.2 fL — ABNORMAL LOW (ref 80.0–100.0)
Platelets: 295 10*3/uL (ref 150–400)
RBC: 4.25 MIL/uL (ref 3.87–5.11)
RDW: 24.4 % — ABNORMAL HIGH (ref 11.5–15.5)
WBC: 14.8 10*3/uL — ABNORMAL HIGH (ref 4.0–10.5)
nRBC: 0 % (ref 0.0–0.2)

## 2019-05-22 LAB — LIPASE, BLOOD: Lipase: 28 U/L (ref 11–51)

## 2019-05-22 LAB — I-STAT BETA HCG BLOOD, ED (MC, WL, AP ONLY): I-stat hCG, quantitative: <5 m[IU]/mL (ref <5)

## 2019-05-22 MED ORDER — ONDANSETRON 4 MG PO TBDP
4.0000 mg | ORAL_TABLET | Freq: Once | ORAL | Status: AC
  Administered 2019-05-22: 23:00:00 4 mg via ORAL
  Filled 2019-05-22: qty 1

## 2019-05-22 MED ORDER — OXYCODONE-ACETAMINOPHEN 5-325 MG PO TABS
1.0000 | ORAL_TABLET | ORAL | Status: DC | PRN
  Administered 2019-05-22: 1 via ORAL
  Filled 2019-05-22: qty 1

## 2019-05-22 NOTE — ED Triage Notes (Signed)
Pt arrives POV for eval of L sided flank pain w/ nausea/vomiting onset around 1700 this evening. Denies hx of same

## 2019-05-23 NOTE — ED Notes (Signed)
Pt called for vitals x3. No answer 

## 2019-05-25 ENCOUNTER — Ambulatory Visit (HOSPITAL_COMMUNITY): Payer: Self-pay

## 2019-05-26 ENCOUNTER — Telehealth: Payer: Self-pay

## 2019-05-26 NOTE — Telephone Encounter (Signed)
Patient called and is asking for something to help relax her when she has MRI done due to be claustrophobic. She is also asking if you can order all her MRI's so she can have them done at 1 time so she doesn't have to keep going through this. Please advise. Thanks !

## 2019-05-27 ENCOUNTER — Other Ambulatory Visit: Payer: Self-pay

## 2019-05-27 ENCOUNTER — Encounter: Payer: Self-pay | Admitting: Pulmonary Disease

## 2019-05-27 ENCOUNTER — Ambulatory Visit (INDEPENDENT_AMBULATORY_CARE_PROVIDER_SITE_OTHER): Payer: Self-pay | Admitting: Pulmonary Disease

## 2019-05-27 VITALS — BP 130/78 | HR 107 | Temp 98.2°F | Ht 65.0 in | Wt 273.6 lb

## 2019-05-27 DIAGNOSIS — F17200 Nicotine dependence, unspecified, uncomplicated: Secondary | ICD-10-CM

## 2019-05-27 DIAGNOSIS — Z6841 Body Mass Index (BMI) 40.0 and over, adult: Secondary | ICD-10-CM

## 2019-05-27 DIAGNOSIS — R0600 Dyspnea, unspecified: Secondary | ICD-10-CM

## 2019-05-27 DIAGNOSIS — R0609 Other forms of dyspnea: Secondary | ICD-10-CM

## 2019-05-27 DIAGNOSIS — M7989 Other specified soft tissue disorders: Secondary | ICD-10-CM

## 2019-05-27 DIAGNOSIS — R05 Cough: Secondary | ICD-10-CM

## 2019-05-27 DIAGNOSIS — R059 Cough, unspecified: Secondary | ICD-10-CM

## 2019-05-27 DIAGNOSIS — G479 Sleep disorder, unspecified: Secondary | ICD-10-CM

## 2019-05-27 DIAGNOSIS — R0602 Shortness of breath: Secondary | ICD-10-CM

## 2019-05-27 DIAGNOSIS — J449 Chronic obstructive pulmonary disease, unspecified: Secondary | ICD-10-CM

## 2019-05-27 NOTE — Patient Instructions (Addendum)
Thank you for visiting Dr. Valeta Harms at [facility]. Today we recommend the following: Orders Placed This Encounter  Procedures  . Ambulatory Referral for DME  . Pulmonary function test    Return in about 1 month (around 06/27/2019). With APP.     Please do your part to reduce the spread of COVID-19.

## 2019-05-27 NOTE — Telephone Encounter (Signed)
I will call and discuss this with the patient when I am back in the office. If she does call in the meantime, you can let her know that I will send in vistaril for her for the sedation. She has not previously mentioned that she was claustrophobic.

## 2019-05-27 NOTE — Progress Notes (Signed)
Synopsis: Referred in August 2020 for COPD by Lanae Boast, FNP  Subjective:   PATIENT ID: Jill Shaw GENDER: female DOB: [birth year], MRN: [id number]  Chief Complaint  Patient presents with  . Consult    Consult for possible COPD. She reports SOB with exertion and increased wheezing. Reports a frequent dry cough. Currenlty using Albuterol prn and Symbicort 160 daily and Albuterol nebs prn. Smokes 6 cigarettes/day.     43 year old female with a past medical history of asthma, COPD, no PFTs on file, gastroesophageal reflux disease, hypertension patient was seen in the emergency department in May 2020 for accidental drug overdose.  Multiple prior lab draws with positive alcohol intoxication. She is here today for evaluation of SOB. She states that it really all started bout 10-15 years ago. She was on lisinopril in the past and it was stopped because she was told it could cause her cough or SOB. She has had cough for a long time. She has been given several different medications and nothing has worked. Current smoker for the past 8-9 years, currently down to 6-7 cigarettes per day, down from 10 per day. 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 She did have dust exposure in the warehouse. She has DMII, GERD.  She has very little exercise if any at all during the week.     Past Medical History:  Diagnosis Date  . Anemia   . Arthritis    knees, hands  . Asthma   . COPD (chronic obstructive pulmonary disease) ([place])   . Diabetes mellitus without complication ([place])    type 2  . GERD (gastroesophageal reflux disease)   . Hypertension   . Neuromuscular disorder (HCC)    neuropathy feet  . Seizures ([place])    pt states r/t stress and blood sugar - no meds last one 4 months ago, not seen neurologist  . Sickle cell trait ([place])   . Smoker      Family History  Problem Relation Age of Onset  . Diabetes Mother   . Hypertension Mother      Past Surgical History:   Procedure Laterality Date  . CESAREAN SECTION     x 1  . EYE SURGERY Bilateral    laser right and cataract removed left eye  . UPPER GI ENDOSCOPY  07/2017    Social History   Socioeconomic History  . Marital status: Legally Separated    Spouse name: Not on file  . Number of children: Not on file  . Years of education: Not on file  . Highest education level: Not on file  Occupational History  . Not on file  Social Needs  . Financial resource strain: Not on file  . Food insecurity    Worry: Not on file    Inability: Not on file  . Transportation needs    Medical: Not on file    Non-medical: Not on file  Tobacco Use  . Smoking status: Current Every Day Smoker    Packs/day: 0.25    Years: 15.00    Pack years: 3.75    Types: Cigarettes  . Smokeless tobacco: Never Used  Substance and Sexual Activity  . Alcohol use: No  . Drug use: No  . Sexual activity: Not Currently    Birth control/protection: None  Lifestyle  . Physical activity    Days per week: Not on file    Minutes per session: Not on file  . Stress: Not on file  Relationships  .  Social Herbalist on phone: Not on file    Gets together: Not on file    Attends religious service: Not on file    Active member of club or organization: Not on file    Attends meetings of clubs or organizations: Not on file    Relationship status: Not on file  . Intimate partner violence    Fear of current or ex partner: Not on file    Emotionally abused: Not on file    Physically abused: Not on file    Forced sexual activity: Not on file  Other Topics Concern  . Not on file  Social History Narrative  . Not on file     Allergies  Allergen Reactions  . Ketoprofen Nausea And Vomiting  . Aspirin Nausea Only  . Cortizone-10 [Hydrocortisone] Nausea Only  . Gabapentin Nausea And Vomiting    upset stomach  . Ibuprofen Nausea Only and Nausea And Vomiting  . Liraglutide Nausea And Vomiting  . Naproxen Nausea And  Vomiting  . Omeprazole-Sodium Bicarbonate Nausea And Vomiting  . Other Nausea And Vomiting  . Sulfa Antibiotics Nausea And Vomiting  . Tramadol Nausea And Vomiting    stomach upset     Outpatient Medications Prior to Visit  Medication Sig Dispense Refill  . albuterol (PROVENTIL HFA;VENTOLIN HFA) 108 (90 Base) MCG/ACT inhaler Inhale 2 puffs into the lungs every 6 (six) hours as needed for wheezing or shortness of breath. 1 Inhaler 0  . albuterol (PROVENTIL) (2.5 MG/3ML) 0.083% nebulizer solution Take 3 mLs (2.5 mg total) by nebulization every 6 (six) hours as needed for wheezing or shortness of breath. 150 mL 1  . amLODipine (NORVASC) 5 MG tablet Take 1 tablet (5 mg total) by mouth daily. 90 tablet 3  . Blood Glucose Monitoring Suppl (TRUE METRIX METER) w/Device KIT 1 each by Does not apply route 4 (four) times daily -  before meals and at bedtime. 1 kit 0  . budesonide-formoterol (SYMBICORT) 160-4.5 MCG/ACT inhaler Inhale 2 puffs into the lungs 2 (two) times daily. 1 Inhaler 3  . cetirizine (ZYRTEC) 10 MG tablet Take 1 tablet (10 mg total) by mouth daily. 30 tablet 11  . cholecalciferol (VITAMIN D) 1000 units tablet Take 1,000 Units by mouth daily.    . clindamycin (CLEOCIN-T) 1 % lotion Apply topically 2 (two) times daily. 60 mL 0  . ferrous sulfate 300 (60 Fe) MG/5ML syrup Take 5 mLs (300 mg total) by mouth daily. 150 mL 3  . ferrous sulfate 325 (65 FE) MG tablet Take 1 tablet (325 mg total) by mouth 3 (three) times daily with meals. 90 tablet 0  . fluticasone (FLONASE) 50 MCG/ACT nasal spray Place 2 sprays into both nostrils daily. 16 g 6  . furosemide (LASIX) 40 MG tablet Take 1 tablet (40 mg total) by mouth daily. 30 tablet 3  . glucose blood (TRUE METRIX BLOOD GLUCOSE TEST) test strip Use as instructed 100 each 12  . Insulin Lispro Prot & Lispro (HUMALOG MIX 75/25 KWIKPEN) (75-25) 100 UNIT/ML Kwikpen INJECT 100 UNITS EVERY 12 HOURS 15 mL 11  . Insulin Pen Needle (PEN NEEDLES) 30G X 5 MM  MISC 1 Units by Does not apply route 2 (two) times a day. 100 each 1  . losartan (COZAAR) 100 MG tablet Take 1 tablet (100 mg total) by mouth daily. 90 tablet 3  . metoprolol succinate (TOPROL-XL) 25 MG 24 hr tablet Take 0.5 tablets (12.5 mg total) by mouth daily.  30 tablet 5  . omeprazole (PRILOSEC) 40 MG capsule Take 1 capsule (40 mg total) by mouth daily. 30 capsule 3  . potassium chloride SA (K-DUR) 20 MEQ tablet Take 1 tablet (20 mEq total) by mouth daily. 30 tablet 3  . pregabalin (LYRICA) 75 MG capsule Take 1 capsule (75 mg total) by mouth 2 (two) times daily. 180 capsule 1  . saxagliptin HCl (ONGLYZA) 2.5 MG TABS tablet Take 1 tablet (2.5 mg total) by mouth daily. 90 tablet 3   No facility-administered medications prior to visit.     Review of Systems  Constitutional: Negative for chills, fever, malaise/fatigue and weight loss.  HENT: Negative for hearing loss, sore throat and tinnitus.   Eyes: Negative for blurred vision and double vision.  Respiratory: Positive for cough, sputum production and shortness of breath. Negative for hemoptysis, wheezing and stridor.   Cardiovascular: Positive for chest pain and leg swelling. Negative for palpitations, orthopnea and PND.  Gastrointestinal: Positive for heartburn and nausea. Negative for abdominal pain, constipation, diarrhea and vomiting.  Genitourinary: Negative for dysuria, hematuria and urgency.  Musculoskeletal: Negative for joint pain and myalgias.  Skin: Negative for itching and rash.  Neurological: Positive for headaches. Negative for dizziness, tingling and weakness.  Endo/Heme/Allergies: Negative for environmental allergies. Does not bruise/bleed easily.  Psychiatric/Behavioral: Negative for depression. The patient is not nervous/anxious and does not have insomnia.   All other systems reviewed and are negative.    Objective:  Physical Exam Vitals signs reviewed.  Constitutional:      General: She is not in acute  distress.    Appearance: She is well-developed. She is obese.  HENT:     Head: Normocephalic and atraumatic.  Eyes:     General: No scleral icterus.    Conjunctiva/sclera: Conjunctivae normal.     Pupils: Pupils are equal, round, and reactive to light.  Neck:     Musculoskeletal: Neck supple.     Vascular: No JVD.     Trachea: No tracheal deviation.  Cardiovascular:     Rate and Rhythm: Normal rate and regular rhythm.     Heart sounds: Normal heart sounds. No murmur.  Pulmonary:     Effort: Pulmonary effort is normal. No tachypnea, accessory muscle usage or respiratory distress.     Breath sounds: Normal breath sounds. No stridor. No wheezing, rhonchi or rales.  Abdominal:     General: Bowel sounds are normal. There is no distension.     Palpations: Abdomen is soft.     Tenderness: There is no abdominal tenderness.     Comments: Obese pannus   Musculoskeletal:        General: No tenderness.  Lymphadenopathy:     Cervical: No cervical adenopathy.  Skin:    General: Skin is warm and dry.     Capillary Refill: Capillary refill takes less than 2 seconds.     Findings: No rash.  Neurological:     Mental Status: She is alert and oriented to person, place, and time.  Psychiatric:        Mood and Affect: Mood normal.        Behavior: Behavior normal.      Vitals:   05/27/19 1554  BP: 130/78  Pulse: (!) 107  Temp: 98.2 F (36.8 C)  TempSrc: Oral  SpO2: 100%  Weight: 273 lb 9.6 oz (124.1 kg)  Height: _0  (1.651 m)   100% on RA BMI Readings from Last 3 Encounters:  05/27/19 45.53 kg/m  05/22/19 50.77 kg/m  04/17/19 47.38 kg/m   Wt Readings from Last 3 Encounters:  05/27/19 273 lb 9.6 oz (124.1 kg)  05/22/19 286 lb 9.6 oz (130 kg)  04/17/19 276 lb (125.2 kg)     CBC    Component Value Date/Time   WBC 14.8 (H) 05/22/2019 2226   RBC 4.25 05/22/2019 2226   HGB 8.9 (L) 05/22/2019 2226   HGB 8.5 (L) 09/17/2018 1549   HCT 31.1 (L) 05/22/2019 2226   HCT 30.4  (L) 09/17/2018 1549   PLT 295 05/22/2019 2226   PLT 330 09/17/2018 1549   MCV 73.2 (L) 05/22/2019 2226   MCV 70 (L) 09/17/2018 1549   MCH 20.9 (L) 05/22/2019 2226   MCHC 28.6 (L) 05/22/2019 2226   RDW 24.4 (H) 05/22/2019 2226   RDW 22.2 (H) 09/17/2018 1549   LYMPHSABS 2.7 03/08/2019 0450   LYMPHSABS 2.1 01/07/2018 1236   MONOABS 0.7 03/08/2019 0450   EOSABS 0.3 03/08/2019 0450   EOSABS 0.3 01/07/2018 1236   BASOSABS 0.1 03/08/2019 0450   BASOSABS 0.0 01/07/2018 1236    Chest Imaging: 02/07/2019 chest x-ray: No acute cardiopulmonary process. The patient's images have been independently reviewed by me.    Pulmonary Functions Testing Results: No flowsheet data found.  FeNO: None  Pathology: None  Echocardiogram: None  Heart Catheterization: None    Assessment & Plan:     ICD-10-CM   1. DOE (dyspnea on exertion)  R06.09   2. Chronic obstructive pulmonary disease, unspecified COPD type ([place])  J44.9 Ambulatory Referral for DME    Pulmonary function test  3. SOB (shortness of breath)  R06.02   4. Cough  R05   5. Current smoker  F17.200   6. Class 3 severe obesity due to excess calories with serious comorbidity and body mass index (BMI) of 45.0 to 49.9 in adult (HCC)  E66.01    Z68.42   7. Sleep disorder  G47.9   8. Leg swelling  M79.89     Discussion:  This is a 43 year old obese female.  Multifactorial reasons for dyspnea on exertion.  She is significantly obese.  I suspect she probably has sleep disordered breathing of some kind likely has obstructive sleep apnea.  She has chronic bilateral lower extremity swelling.  She is relatively inactive throughout the day.  So when she does become active I think just her obesity and lack of physical conditioning prevents her from maintaining in her becoming more dyspneic on exertion.  She does have a smoking history.  She is only smoked about a half a pack a day for the past 8 to 9 years.  I do not believe her smoking history is  not clinically significant enough to have a smoking-related disorder such as COPD but she does not have any pulmonary function tests.  I do believe her cough is likely related to ongoing smoking.  This is very common and we talked about it today in the office.  Today in the office we discussed tapering methods for smoking cessation I think this would be pertinent for her.  I think if she quit smoking she would feel better and breathe better.  We also discussed exercise weight loss options.  As for her potential underlying lung disease we will obtain full pulmonary function tests.  If there is a significant abnormality in the function test we may consider further imaging of the chest.  Otherwise, she can continue on her current inhaler regimen for management of her intermittent  asthma.  I think her asthma is likely obesity phenotype related.  She does not have any significant allergy type component per history.  If her asthma symptoms continue to be a problem we can consider evaluating for more of an atopic-like phenotype looking at absolute eosinophil counts and regional allergy panels.  Patient to return to clinic within the next 3 to 4 weeks after having COVID testing for outpatient pulmonary function test and she can follow-up with one of our nurse practitioners to review these results and see if she is having any additional symptoms and see how she is doing with these recommendations.  Greater than 50% of this patient's 45-minute office visit was been face-to-face discussing above recommendations and treatment plan.  As well as counseling regarding smoking cessation.    Current Outpatient Medications:  .  albuterol (PROVENTIL HFA;VENTOLIN HFA) 108 (90 Base) MCG/ACT inhaler, Inhale 2 puffs into the lungs every 6 (six) hours as needed for wheezing or shortness of breath., Disp: 1 Inhaler, Rfl: 0 .  albuterol (PROVENTIL) (2.5 MG/3ML) 0.083% nebulizer solution, Take 3 mLs (2.5 mg total) by  nebulization every 6 (six) hours as needed for wheezing or shortness of breath., Disp: 150 mL, Rfl: 1 .  amLODipine (NORVASC) 5 MG tablet, Take 1 tablet (5 mg total) by mouth daily., Disp: 90 tablet, Rfl: 3 .  Blood Glucose Monitoring Suppl (TRUE METRIX METER) w/Device KIT, 1 each by Does not apply route 4 (four) times daily -  before meals and at bedtime., Disp: 1 kit, Rfl: 0 .  budesonide-formoterol (SYMBICORT) 160-4.5 MCG/ACT inhaler, Inhale 2 puffs into the lungs 2 (two) times daily., Disp: 1 Inhaler, Rfl: 3 .  cetirizine (ZYRTEC) 10 MG tablet, Take 1 tablet (10 mg total) by mouth daily., Disp: 30 tablet, Rfl: 11 .  cholecalciferol (VITAMIN D) 1000 units tablet, Take 1,000 Units by mouth daily., Disp: , Rfl:  .  clindamycin (CLEOCIN-T) 1 % lotion, Apply topically 2 (two) times daily., Disp: 60 mL, Rfl: 0 .  ferrous sulfate 300 (60 Fe) MG/5ML syrup, Take 5 mLs (300 mg total) by mouth daily., Disp: 150 mL, Rfl: 3 .  ferrous sulfate 325 (65 FE) MG tablet, Take 1 tablet (325 mg total) by mouth 3 (three) times daily with meals., Disp: 90 tablet, Rfl: 0 .  fluticasone (FLONASE) 50 MCG/ACT nasal spray, Place 2 sprays into both nostrils daily., Disp: 16 g, Rfl: 6 .  furosemide (LASIX) 40 MG tablet, Take 1 tablet (40 mg total) by mouth daily., Disp: 30 tablet, Rfl: 3 .  glucose blood (TRUE METRIX BLOOD GLUCOSE TEST) test strip, Use as instructed, Disp: 100 each, Rfl: 12 .  Insulin Lispro Prot & Lispro (HUMALOG MIX 75/25 KWIKPEN) (75-25) 100 UNIT/ML Kwikpen, INJECT 100 UNITS EVERY 12 HOURS, Disp: 15 mL, Rfl: 11 .  Insulin Pen Needle (PEN NEEDLES) 30G X 5 MM MISC, 1 Units by Does not apply route 2 (two) times a day., Disp: 100 each, Rfl: 1 .  losartan (COZAAR) 100 MG tablet, Take 1 tablet (100 mg total) by mouth daily., Disp: 90 tablet, Rfl: 3 .  metoprolol succinate (TOPROL-XL) 25 MG 24 hr tablet, Take 0.5 tablets (12.5 mg total) by mouth daily., Disp: 30 tablet, Rfl: 5 .  omeprazole (PRILOSEC) 40 MG  capsule, Take 1 capsule (40 mg total) by mouth daily., Disp: 30 capsule, Rfl: 3 .  potassium chloride SA (K-DUR) 20 MEQ tablet, Take 1 tablet (20 mEq total) by mouth daily., Disp: 30 tablet, Rfl: 3 .  pregabalin (LYRICA) 75 MG capsule, Take 1 capsule (75 mg total) by mouth 2 (two) times daily., Disp: 180 capsule, Rfl: 1 .  saxagliptin HCl (ONGLYZA) 2.5 MG TABS tablet, Take 1 tablet (2.5 mg total) by mouth daily., Disp: 90 tablet, Rfl: 3   Garner Nash, DO [facility] 05/27/2019 3:59 PM

## 2019-05-28 ENCOUNTER — Telehealth: Payer: Self-pay | Admitting: Pulmonary Disease

## 2019-05-28 NOTE — Telephone Encounter (Signed)
ATC patient went straight to vm. LM to call back

## 2019-05-28 NOTE — Telephone Encounter (Signed)
LMTCB. Per BI patient can have her PFT moved up sooner however she cannot have a note to take her out of work at this time due to the fact that she is still smoking.   Will leave in triage and try back tomorrow.

## 2019-05-28 NOTE — Addendum Note (Signed)
Addended by: Vivia Ewing on: 05/28/2019 03:01 PM   Modules accepted: Orders

## 2019-05-29 ENCOUNTER — Telehealth: Payer: Self-pay

## 2019-05-29 NOTE — Telephone Encounter (Signed)
LMTCB. Please schedule patient a Consult appt with AO or VS per BI.

## 2019-05-29 NOTE — Telephone Encounter (Signed)
-----   Message from Garner Nash, DO sent at 05/27/2019  6:01 PM EDT ----- Arcelia Jew schedule a follow-up appointment next available for sleep evaluation by 1 of our sleep doctors, VS or [email or web address]

## 2019-05-30 NOTE — Telephone Encounter (Signed)
Spoke with pt, she made an appt with VS on 06/13/2019 at 9 am. She also wanted to ask Dr. Valeta Harms could she have a note to be out of work until she can get her breathing in control. She wants to be sure she is well enough to go back. Please advise.

## 2019-05-30 NOTE — Telephone Encounter (Signed)
Attempted to call pt but unable to reach. Left message for pt to return call. 

## 2019-05-30 NOTE — Telephone Encounter (Signed)
Pt returning call and can be reached @ [phone].Jill Shaw

## 2019-06-04 ENCOUNTER — Encounter: Payer: Self-pay | Admitting: Podiatry

## 2019-06-04 ENCOUNTER — Ambulatory Visit: Payer: Self-pay | Admitting: Podiatry

## 2019-06-04 ENCOUNTER — Other Ambulatory Visit: Payer: Self-pay | Admitting: Podiatry

## 2019-06-04 ENCOUNTER — Other Ambulatory Visit: Payer: Self-pay

## 2019-06-04 ENCOUNTER — Ambulatory Visit: Payer: No Typology Code available for payment source

## 2019-06-04 VITALS — Temp 97.1°F

## 2019-06-04 DIAGNOSIS — M7751 Other enthesopathy of right foot: Secondary | ICD-10-CM

## 2019-06-04 DIAGNOSIS — M778 Other enthesopathies, not elsewhere classified: Secondary | ICD-10-CM

## 2019-06-04 DIAGNOSIS — M779 Enthesopathy, unspecified: Secondary | ICD-10-CM

## 2019-06-04 DIAGNOSIS — M79672 Pain in left foot: Secondary | ICD-10-CM

## 2019-06-04 DIAGNOSIS — M79671 Pain in right foot: Secondary | ICD-10-CM

## 2019-06-04 DIAGNOSIS — R600 Localized edema: Secondary | ICD-10-CM

## 2019-06-04 MED ORDER — METHYLPREDNISOLONE 4 MG PO TBPK: ORAL_TABLET | ORAL | 0 refills | Status: DC

## 2019-06-06 ENCOUNTER — Other Ambulatory Visit: Payer: Self-pay

## 2019-06-06 ENCOUNTER — Ambulatory Visit (INDEPENDENT_AMBULATORY_CARE_PROVIDER_SITE_OTHER): Payer: Self-pay | Admitting: Family Medicine

## 2019-06-06 ENCOUNTER — Ambulatory Visit (HOSPITAL_COMMUNITY): Admission: RE | Admit: 2019-06-06 | Payer: Self-pay | Source: Ambulatory Visit

## 2019-06-06 ENCOUNTER — Ambulatory Visit (HOSPITAL_COMMUNITY): Payer: Self-pay

## 2019-06-06 ENCOUNTER — Encounter: Payer: Self-pay | Admitting: Family Medicine

## 2019-06-06 VITALS — BP 161/68 | HR 100 | Temp 98.6°F | Resp 18 | Ht 65.0 in | Wt 278.0 lb

## 2019-06-06 DIAGNOSIS — I10 Essential (primary) hypertension: Secondary | ICD-10-CM

## 2019-06-06 DIAGNOSIS — R000 Tachycardia, unspecified: Secondary | ICD-10-CM

## 2019-06-06 DIAGNOSIS — Z794 Long term (current) use of insulin: Secondary | ICD-10-CM

## 2019-06-06 DIAGNOSIS — M25561 Pain in right knee: Secondary | ICD-10-CM

## 2019-06-06 DIAGNOSIS — G629 Polyneuropathy, unspecified: Secondary | ICD-10-CM

## 2019-06-06 DIAGNOSIS — M5134 Other intervertebral disc degeneration, thoracic region: Secondary | ICD-10-CM

## 2019-06-06 DIAGNOSIS — M5136 Other intervertebral disc degeneration, lumbar region: Secondary | ICD-10-CM

## 2019-06-06 DIAGNOSIS — G8929 Other chronic pain: Secondary | ICD-10-CM

## 2019-06-06 DIAGNOSIS — E119 Type 2 diabetes mellitus without complications: Secondary | ICD-10-CM

## 2019-06-06 DIAGNOSIS — M25562 Pain in left knee: Secondary | ICD-10-CM

## 2019-06-06 LAB — POCT URINALYSIS DIPSTICK
Bilirubin, UA: NEGATIVE
Blood, UA: NEGATIVE
Glucose, UA: NEGATIVE
Ketones, UA: NEGATIVE
Leukocytes, UA: NEGATIVE
Nitrite, UA: NEGATIVE
Protein, UA: POSITIVE — AB
Spec Grav, UA: 1.02 (ref 1.010–1.025)
Urobilinogen, UA: 0.2 E.U./dL
pH, UA: 6 (ref 5.0–8.0)

## 2019-06-06 LAB — POCT GLYCOSYLATED HEMOGLOBIN (HGB A1C): Hemoglobin A1C: 7.9 % — AB (ref 4.0–5.6)

## 2019-06-06 LAB — GLUCOSE, POCT (MANUAL RESULT ENTRY): POC Glucose: 153 mg/dl — AB (ref 70–99)

## 2019-06-06 MED ORDER — FUROSEMIDE 80 MG PO TABS: 80.0000 mg | ORAL_TABLET | Freq: Every day | ORAL | 1 refills | Status: DC

## 2019-06-06 MED ORDER — METOPROLOL SUCCINATE ER 25 MG PO TB24: 25.0000 mg | ORAL_TABLET | Freq: Every day | ORAL | 5 refills | Status: DC

## 2019-06-06 MED FILL — ?FUROSEMIDE 80MG TABLET: 80 | 30 days supply | Qty: 30 | Fill #0

## 2019-06-06 MED FILL — METOPROLOL SUCCINATE ER 25: 25 | 30 days supply | Qty: 30 | Fill #0

## 2019-06-06 NOTE — Progress Notes (Signed)
[facility]   Progress Note: General Provider: Mike GipAndre Jewelianna Pancoast, FNP  SUBJECTIVE:   Jill SauersJulie Mae Shaw is a 43 y.o. female who  has a past medical history of Anemia, Arthritis, Asthma, COPD (chronic obstructive pulmonary disease) (HCC), Diabetes mellitus without complication (HCC), GERD (gastroesophageal reflux disease), Hypertension, Neuromuscular disorder (HCC), Seizures (HCC), Sickle cell trait (HCC), and Smoker.. Patient presents today for Edema (both legs ), Hypertension, and Diabetes Patient states that she is upset about not having a MRI of her legs. Instead, this provider ordered an MRI of her spine. She states that she needs sedation and is not going to the MRI that is scheduled for today. She has used benzos and vistaril in the past without sedation or relief. She is requesting a full body MRI under general anesthesia. Patient also states that she continues to smoke cigarettes and is not interested in quitting. She is followed by Dr. Tonia Shaw in pulmonology and has PFTs pending. She also states that she has increased lower extremity swelling. She has a history of neuropathy in the upper extremities and would like a referral to neurology. She is going to follow up with her gynecologist for her well woman exam in the near future.     Review of Systems  Constitutional: Negative.   HENT: Negative.   Eyes: Negative.   Respiratory: Negative.   Cardiovascular: Positive for leg swelling.  Gastrointestinal: Negative.   Genitourinary: Negative.   Musculoskeletal: Positive for back pain and joint pain.  Skin: Negative.   Neurological: Positive for tingling and weakness.  Psychiatric/Behavioral: Negative.      OBJECTIVE: BP (!) 161/68 (BP Location: Right Arm, Patient Position: Sitting, Cuff Size: Large)   Pulse 100   Temp 98.6 F (37 C) (Oral)   Resp 18   Ht 5\' 5"  (1.651 m)   Wt 278 lb (126.1 kg)   LMP 05/25/2019   SpO2 100%   BMI 46.26  kg/m   Wt Readings from Last 3 Encounters:  06/06/19 278 lb (126.1 kg)  05/27/19 273 lb 9.6 oz (124.1 kg)  05/22/19 286 lb 9.6 oz (130 kg)     Physical Exam Vitals signs and nursing note reviewed.  Constitutional:      General: She is not in acute distress.    Appearance: Normal appearance.  HENT:     Head: Normocephalic and atraumatic.  Eyes:     Extraocular Movements: Extraocular movements intact.     Conjunctiva/sclera: Conjunctivae normal.     Pupils: Pupils are equal, round, and reactive to light.  Cardiovascular:     Rate and Rhythm: Normal rate and regular rhythm.     Heart sounds: No murmur.  Pulmonary:     Effort: Pulmonary effort is normal.     Breath sounds: Normal breath sounds.  Musculoskeletal: Normal range of motion.     Right lower leg: Edema (2+ pitting. ) present.     Left lower leg: Edema (2+ pitting) present.  Skin:    General: Skin is warm and dry.  Neurological:     Mental Status: She is alert and oriented to person, place, and time.  Psychiatric:        Mood and Affect: Mood normal.        Behavior: Behavior normal.        Thought Content: Thought content normal.        Judgment: Judgment normal.     ASSESSMENT/PLAN:  1. Essential hypertension Increased lasix to 80  mg po daily. Increased metoprolol xl to 25mg  daily. D/c'd amlodipine as this can cause peripheral edema.  - Urinalysis Dipstick - Comprehensive metabolic panel - Magnesium - metoprolol succinate (TOPROL-XL) 25 MG 24 hr tablet; Take 1 tablet (25 mg total) by mouth daily.  Dispense: 30 tablet; Refill: 5 - furosemide (LASIX) 80 MG tablet; Take 1 tablet (80 mg total) by mouth daily.  Dispense: 90 tablet; Refill: 1  2. Type 2 diabetes mellitus without complication, with long-term current use of insulin (HCC) No medication changes. Encouraged exercise and carb modified diet.  - Urinalysis Dipstick - HgB A1c - Glucose (CBG)  3. Other intervertebral disc degeneration, lumbar region  Ordered - MR Lumbar Spine Wo Contrast; Future  4. Other intervertebral disc degeneration, thoracic region Ordered - MR Thoracic Spine Wo Contrast; Future  5. Chronic pain of both knees Ordered - MR ANGIO LOWER EXTREMITY BILATERAL WO CONTRAST; Future  6. Tachycardia Medication increased to 25 mg po QD.  - metoprolol succinate (TOPROL-XL) 25 MG 24 hr tablet; Take 1 tablet (25 mg total) by mouth daily.  Dispense: 30 tablet; Refill: 5   Return in about 3 months (around 09/06/2019) for DM2.    The patient was given clear instructions to go to ER or return to medical center if symptoms do not improve, worsen or new problems develop. The patient verbalized understanding and agreed with plan of care.   [name]. Jill Canary, FNP-BC [facility] [address] [place], [place] [address] [phone]

## 2019-06-06 NOTE — Patient Instructions (Addendum)
I stopped the amlodipine. I increased the metoprolol and furosemide. I ordered the MRIs with general sedation.   Chronic Back Pain When back pain lasts longer than 3 months, it is called chronic back pain. Pain may get worse at certain times (flare-ups). There are things you can do at home to manage your pain. Follow these instructions at home: Activity      Avoid bending and other activities that make pain worse.  When standing: ? Keep your upper back and neck straight. ? Keep your shoulders pulled back. ? Avoid slouching.  When sitting: ? Keep your back straight. ? Relax your shoulders. Do not round your shoulders or pull them backward.  Do not sit or stand in one place for long periods of time.  Take short rest breaks during the day. Lying down or standing is usually better than sitting. Resting can help relieve pain.  When sitting or lying down for a long time, do some mild activity or stretching. This will help to prevent stiffness and pain.  Get regular exercise. Ask your doctor what activities are safe for you.  Do not lift anything that is heavier than 10 lb (4.5 kg). To prevent injury when you lift things: ? Bend your knees. ? Keep the weight close to your body. ? Avoid twisting. Managing pain  If told, put ice on the painful area. Your doctor may tell you to use ice for 24-48 hours after a flare-up starts. ? Put ice in a plastic bag. ? Place a towel between your skin and the bag. ? Leave the ice on for 20 minutes, 2-3 times a day.  If told, put heat on the painful area as often as told by your doctor. Use the heat source that your doctor recommends, such as a moist heat pack or a heating pad. ? Place a towel between your skin and the heat source. ? Leave the heat on for 20-30 minutes. ? Remove the heat if your skin turns bright red. This is especially important if you are unable to feel pain, heat, or cold. You may have a greater risk of getting burned.  Soak in  a warm bath. This can help relieve pain.  Take over-the-counter and prescription medicines only as told by your doctor. General instructions  Sleep on a firm mattress. Try lying on your side with your knees slightly bent. If you lie on your back, put a pillow under your knees.  Keep all follow-up visits as told by your doctor. This is important. Contact a doctor if:  You have pain that does not get better with rest or medicine. Get help right away if:  One or both of your arms or legs feel weak.  One or both of your arms or legs lose feeling (numbness).  You have trouble controlling when you poop (bowel movement) or pee (urinate).  You feel sick to your stomach (nauseous).  You throw up (vomit).  You have belly (abdominal) pain.  You have shortness of breath.  You pass out (faint). Summary  When back pain lasts longer than 3 months, it is called chronic back pain.  Pain may get worse at certain times (flare-ups).  Use ice and heat as told by your doctor. Your doctor may tell you to use ice after flare-ups. This information is not intended to replace advice given to you by your health care provider. Make sure you discuss any questions you have with your health care provider. Document Released: 03/20/2008 Document Revised:  01/23/2019 Document Reviewed: 05/17/2017 Elsevier Patient Education  [place].

## 2019-06-07 LAB — COMPREHENSIVE METABOLIC PANEL
ALT: 25 IU/L (ref 0–32)
AST: 72 IU/L — ABNORMAL HIGH (ref 0–40)
Albumin/Globulin Ratio: 1.6 (ref 1.2–2.2)
Albumin: 4.2 g/dL (ref 3.8–4.8)
Alkaline Phosphatase: 100 IU/L (ref 39–117)
BUN/Creatinine Ratio: 8 — ABNORMAL LOW (ref 9–23)
BUN: 7 mg/dL (ref 6–24)
Bilirubin Total: 0.2 mg/dL (ref 0.0–1.2)
CO2: 21 mmol/L (ref 20–29)
Calcium: 9.1 mg/dL (ref 8.7–10.2)
Chloride: 103 mmol/L (ref 96–106)
Creatinine, Ser: 0.93 mg/dL (ref 0.57–1.00)
GFR calc Af Amer: 88 mL/min/{1.73_m2} (ref >59)
GFR calc non Af Amer: 76 mL/min/{1.73_m2} (ref >59)
Globulin, Total: 2.6 g/dL (ref 1.5–4.5)
Glucose: 96 mg/dL (ref 65–99)
Potassium: 4 mmol/L (ref 3.5–5.2)
Sodium: 141 mmol/L (ref 134–144)
Total Protein: 6.8 g/dL (ref 6.0–8.5)

## 2019-06-07 LAB — MAGNESIUM: Magnesium: 1.3 mg/dL — ABNORMAL LOW (ref 1.6–2.3)

## 2019-06-07 NOTE — Progress Notes (Signed)
   HPI: 43 year old female presents to office today with a chief complaint of intermittent throbbing bilateral foot and ankle pain that has been ongoing for the past 10 years. She reports pain in the bilateral plantar forefoot up to the posterior ankles.Applying pressure to the feet increases the pain. She has received injections in the past for treatment. Patient is here for further evaluation and treatment.   Past Medical History:  Diagnosis Date  . Anemia   . Arthritis    knees, hands  . Asthma   . COPD (chronic obstructive pulmonary disease) ([place])   . Diabetes mellitus without complication ([place])    type 2  . GERD (gastroesophageal reflux disease)   . Hypertension   . Neuromuscular disorder (HCC)    neuropathy feet  . Seizures ([place])    pt states r/t stress and blood sugar - no meds last one 4 months ago, not seen neurologist  . Sickle cell trait ([place])   . Smoker       Physical Exam: General: The patient is alert and oriented x3 in no acute distress.  Dermatology: Skin is warm, dry and supple bilateral lower extremities. Negative for open lesions or macerations.  Vascular: Palpable pedal pulses bilaterally. No edema or erythema noted. Capillary refill within normal limits.  Neurological: Epicritic and protective threshold grossly intact bilaterally.   Musculoskeletal Exam: Pain on palpation noted along the posterior tibial tendon of the right lower extremity. Limited range of motion noted with inversion and eversion of the subtalar joint right lower extremity.  Pain with palpation noted to the bilateral feet.  Muscle strength 5/5 in all groups bilateral.   Radiographic Exam:  Normal osseous mineralization. Joint spaces preserved. No fracture/dislocation/boney destruction.    Assessment: 1. Subtalar joint coalition-middle facet right 2. Capsulitis bilateral    Plan of Care:  1. Patient was evaluated. X-Rays reviewed.  2. Recommended OTC insoles.  3. Compression  anklets dispensed bilaterally.  4. Prescription for Medrol Dose Pak provided to patient. 5. Allergic to NSAIDs. Recommended OTC Tylenol.  6. Return to clinic as needed.   Edrick Kins, DPM [facility]  Dr. Edrick Kins, DPM    [address], [place] [address]                Office [phone]  Fax [phone]

## 2019-06-09 ENCOUNTER — Telehealth: Payer: Self-pay

## 2019-06-09 NOTE — Telephone Encounter (Signed)
-----   Message from Lanae Boast, [place] sent at 06/09/2019 10:23 AM EDT ----- Please let patient know that her labs are stable. No medication changes warranted.

## 2019-06-09 NOTE — Progress Notes (Signed)
Please let patient know that her labs are stable. No medication changes warranted.

## 2019-06-09 NOTE — Telephone Encounter (Signed)
Called, no answer. Left a message that all labs are stable and to call back if any problems. Thanks!

## 2019-06-11 ENCOUNTER — Telehealth: Payer: Self-pay | Admitting: Family Medicine

## 2019-06-11 ENCOUNTER — Other Ambulatory Visit: Payer: Self-pay

## 2019-06-11 DIAGNOSIS — G629 Polyneuropathy, unspecified: Secondary | ICD-10-CM

## 2019-06-11 NOTE — Telephone Encounter (Signed)
I called and left a voicemail with the scheduler since this has to be done differently because they want her to be under general anesthesia.

## 2019-06-12 ENCOUNTER — Encounter: Payer: Self-pay | Admitting: Neurology

## 2019-06-12 NOTE — Telephone Encounter (Signed)
Carrie from the scheduling department called and says that she will contact patient to schedule. Thanks!

## 2019-06-13 ENCOUNTER — Institutional Professional Consult (permissible substitution): Payer: Self-pay | Admitting: Pulmonary Disease

## 2019-06-13 MED FILL — LOSARTAN POTASSIUM 100 MG T: 100 | 30 days supply | Qty: 30 | Fill #2

## 2019-06-13 MED FILL — ?OMEPRAZOLE 20MG CAP DR: 20 | 30 days supply | Qty: 60 | Fill #1

## 2019-06-13 MED FILL — ?POTASSIUM CL ER 10 MEQ TAB: 10 MEQ | 30 days supply | Qty: 60 | Fill #1

## 2019-06-17 NOTE — Telephone Encounter (Signed)
Staff has left several messages regarding this. No return phone call. Will close message per protocol. Nothing further needed at this time.

## 2019-06-18 ENCOUNTER — Telehealth: Payer: Self-pay | Admitting: Acute Care

## 2019-06-18 ENCOUNTER — Other Ambulatory Visit: Payer: Self-pay | Admitting: Family Medicine

## 2019-06-18 DIAGNOSIS — G8929 Other chronic pain: Secondary | ICD-10-CM

## 2019-06-18 NOTE — Telephone Encounter (Signed)
Called and spoke to patient.  Patient was already scheduled for COVID testing 07/26/19 for other testing.  Scheduled with patient PFT and OV with BI per patient request.  Nothing further needed.

## 2019-06-22 ENCOUNTER — Ambulatory Visit (HOSPITAL_COMMUNITY): Payer: Self-pay | Attending: Family Medicine

## 2019-06-22 ENCOUNTER — Ambulatory Visit (HOSPITAL_COMMUNITY): Admission: RE | Admit: 2019-06-22 | Payer: Self-pay | Source: Ambulatory Visit

## 2019-06-24 ENCOUNTER — Telehealth: Payer: Self-pay | Admitting: Student

## 2019-06-24 NOTE — Telephone Encounter (Signed)
Called the patient to confirm the visit and complete a covid19 screening. The patient picked up the line however did not say anything.   Patient called back and stated she will have to reschedule because her cycle started.

## 2019-06-25 ENCOUNTER — Encounter (HOSPITAL_COMMUNITY): Payer: Self-pay

## 2019-06-25 ENCOUNTER — Encounter (HOSPITAL_COMMUNITY): Payer: Self-pay | Admitting: *Deleted

## 2019-06-25 ENCOUNTER — Ambulatory Visit: Payer: Self-pay | Admitting: Obstetrics & Gynecology

## 2019-06-30 ENCOUNTER — Ambulatory Visit: Payer: Self-pay | Admitting: Family Medicine

## 2019-07-01 ENCOUNTER — Institutional Professional Consult (permissible substitution): Payer: Self-pay | Admitting: Pulmonary Disease

## 2019-07-02 ENCOUNTER — Ambulatory Visit: Payer: Self-pay | Admitting: Acute Care

## 2019-07-03 ENCOUNTER — Encounter: Payer: Self-pay | Admitting: Family Medicine

## 2019-07-03 ENCOUNTER — Other Ambulatory Visit: Payer: Self-pay

## 2019-07-03 ENCOUNTER — Ambulatory Visit (INDEPENDENT_AMBULATORY_CARE_PROVIDER_SITE_OTHER): Payer: Self-pay | Admitting: Family Medicine

## 2019-07-03 VITALS — BP 128/49 | HR 106 | Temp 98.2°F | Resp 16 | Ht 65.0 in | Wt 281.0 lb

## 2019-07-03 DIAGNOSIS — L309 Dermatitis, unspecified: Secondary | ICD-10-CM

## 2019-07-03 DIAGNOSIS — Z23 Encounter for immunization: Secondary | ICD-10-CM

## 2019-07-03 MED ORDER — HYDROCORTISONE 2.5 % EX CREA: TOPICAL_CREAM | Freq: Two times a day (BID) | CUTANEOUS | 0 refills | Status: DC

## 2019-07-03 MED FILL — HYDROCORTISONE 2.5% CREAM: 2.5 | 30 days supply | Qty: 30 | Fill #0

## 2019-07-03 NOTE — Progress Notes (Signed)
[facility]   Progress Note: Sick Visit Provider: Mike GipAndre Chigozie Basaldua, FNP  SUBJECTIVE:   Jill Shaw is a 43 y.o. female who  has a past medical history of Anemia, Arthritis, Asthma, COPD (chronic obstructive pulmonary disease) (HCC), Diabetes mellitus without complication (HCC), GERD (gastroesophageal reflux disease), Hypertension, Neuromuscular disorder (HCC), Seizures (HCC), Sickle cell trait (HCC), and Smoker.. Patient presents today for Follow-up and Eczema  . Patient states that she has been using otc eczema cream on her face with mild relief. She states that she would like to have a prescription due to cost. She reports washing her face with baby wipes and cold water. She also reports taking her medications twice daily. She cannot tell me which medications she is taking in this manner. She appears unsure as to why she made this appointment today. She has a normal follow up in November 2020. Review of Systems  Constitutional: Negative.   HENT: Negative.   Eyes: Negative.   Respiratory: Negative.   Cardiovascular: Negative.   Gastrointestinal: Negative.   Genitourinary: Negative.   Musculoskeletal: Negative.   Skin: Positive for rash (face).  Neurological: Negative.   Psychiatric/Behavioral: Negative.      OBJECTIVE: BP (!) 128/49 (BP Location: Left Arm, Patient Position: Sitting, Cuff Size: Large)   Pulse (!) 106   Temp 98.2 F (36.8 C) (Oral)   Resp 16   Ht 5\' 5"  (1.651 m)   Wt 281 lb (127.5 kg)   LMP 06/26/2019   SpO2 100%   BMI 46.76 kg/m   Wt Readings from Last 3 Encounters:  07/03/19 281 lb (127.5 kg)  06/06/19 278 lb (126.1 kg)  05/27/19 273 lb 9.6 oz (124.1 kg)     Physical Exam Vitals signs and nursing note reviewed.  Constitutional:      General: She is not in acute distress.    Appearance: Normal appearance.  HENT:     Head: Normocephalic and atraumatic.  Eyes:     Extraocular Movements: Extraocular  movements intact.     Conjunctiva/sclera: Conjunctivae normal.     Pupils: Pupils are equal, round, and reactive to light.  Cardiovascular:     Rate and Rhythm: Normal rate and regular rhythm.     Heart sounds: No murmur.  Pulmonary:     Effort: Pulmonary effort is normal.     Breath sounds: Normal breath sounds.  Musculoskeletal: Normal range of motion.  Skin:    General: Skin is warm and dry.     Comments: No rash noted to the face.   Neurological:     Mental Status: She is alert and oriented to person, place, and time.  Psychiatric:        Mood and Affect: Mood normal.        Behavior: Behavior normal.        Thought Content: Thought content normal.        Judgment: Judgment normal.     ASSESSMENT/PLAN:   1. Dermatitis No rash noted today. Patient states that it has cleared with otc medications. Discussed using mild face wash with luke warm water instead of baby wipes. Use a moisturizer to help with dryness.  - hydrocortisone 2.5 % cream; Apply topically 2 (two) times daily.  Dispense: 30 g; Refill: 0  2. Need for influenza vaccination Influenza vaccination given in the office visit today. - Flu Vaccine QUAD 6+ mos PF IM (Fluarix Quad PF)       The patient was  given clear instructions to go to ER or return to medical center if symptoms do not improve, worsen or new problems develop. The patient verbalized understanding and agreed with plan of care.   Ms. Doug Sou. Nathaneil Canary, FNP-BC [facility] [address] [place], [place] [address] [phone]     This note has been created with Dragon speech recognition software and smart phrase technology. Any transcriptional errors are unintentional.

## 2019-07-03 NOTE — Patient Instructions (Addendum)
Eczema Eczema is a broad term for a group of skin conditions that cause skin to become rough and inflamed. Each type of eczema has different triggers, symptoms, and treatments. Eczema of any type is usually itchy and symptoms range from mild to severe. Eczema and its symptoms are not spread from person to person (are not contagious). It can appear on different parts of the body at different times. Your eczema may not look the same as someone else's eczema. What are the types of eczema? Atopic dermatitis This is a long-term (chronic) skin disease that keeps coming back (recurring). Usual symptoms are dry skin and small, solid pimples that may swell and leak fluid (weep). Contact dermatitis  This happens when something irritates the skin and causes a rash. The irritation can come from substances that you are allergic to (allergens), such as poison ivy, chemicals, or medicines that were applied to your skin. Dyshidrotic eczema This is a form of eczema on the hands and feet. It shows up as very itchy, fluid-filled blisters. It can affect people of any age, but is more common before age 40. Hand eczema  This causes very itchy areas of skin on the palms and sides of the hands and fingers. This type of eczema is common in industrial jobs where you may be exposed to many different types of irritants. Lichen simplex chronicus This type of eczema occurs when a person constantly scratches one area of the body. Repeated scratching of the area leads to thickened skin (lichenification). Lichen simplex chronicus can occur along with other types of eczema. It is more common in adults, but may be seen in children as well. Nummular eczema This is a common type of eczema. It has no known cause. It typically causes a red, circular, crusty lesion (plaque) that may be itchy. Scratching may become a habit and can cause bleeding. Nummular eczema occurs most often in people of middle-age or older. It most often affects the  hands. Seborrheic dermatitis This is a common skin disease that mainly affects the scalp. It may also affect any oily areas of the body, such as the face, sides of nose, eyebrows, ears, eyelids, and chest. It is marked by small scaling and redness of the skin (erythema). This can affect people of all ages. In infants, this condition is known as "cradle cap." Stasis dermatitis This is a common skin disease that usually appears on the legs and feet. It most often occurs in people who have a condition that prevents blood from being pumped through the veins in the legs (chronic venous insufficiency). Stasis dermatitis is a chronic condition that needs long-term management. How is eczema diagnosed? Your health care provider will examine your skin and review your medical history. He or she may also give you skin patch tests. These tests involve taking patches that contain possible allergens and placing them on your back. He or she will then check in a few days to see if an allergic reaction occurred. What are the common treatments? Treatment for eczema is based on the type of eczema you have. Hydrocortisone steroid medicine can relieve itching quickly and help reduce inflammation. This medicine may be prescribed or obtained over-the-counter, depending on the strength of the medicine that is needed. Follow these instructions at home:  Take over-the-counter and prescription medicines only as told by your health care provider.  Use creams or ointments to moisturize your skin. Do not use lotions.  Learn what triggers or irritates your symptoms. Avoid these things.    Treat symptom flare-ups quickly.  Do not itch your skin. This can make your rash worse.  Keep all follow-up visits as told by your health care provider. This is important. Where to find more information  The American Academy of Dermatology: www.aad.org  The National Eczema Association: www.nationaleczema.org Contact a health care provider  if:  You have serious itching, even with treatment.  You regularly scratch your skin until it bleeds.  Your rash looks different than usual.  Your skin is painful, swollen, or more red than usual.  You have a fever. Summary  There are eight general types of eczema. Each type has different triggers.  Eczema of any type causes itching that may range from mild to severe.  Treatment varies based on the type of eczema you have. Hydrocortisone steroid medicine can help with itching and inflammation.  Protecting your skin is the best way to prevent eczema. Use moisturizers and lotions. Avoid triggers and irritants, and treat flare-ups quickly. This information is not intended to replace advice given to you by your health care provider. Make sure you discuss any questions you have with your health care provider. Document Released: 02/15/2017 Document Revised: 09/14/2017 Document Reviewed: 02/15/2017 Elsevier Patient Education  2020 Elsevier Inc.  

## 2019-07-07 ENCOUNTER — Telehealth: Payer: Self-pay | Admitting: Podiatry

## 2019-07-07 NOTE — Telephone Encounter (Signed)
States the methylprednisolone (medrol dose Pak) prescribed on 08/19 has not helped the pain. Scheduled to be seen on Wednesday 09/30 at 9:45 am.

## 2019-07-08 MED FILL — ?METOPROLOL SUCC ER 25MG TA: 25 | 30 days supply | Qty: 30 | Fill #1

## 2019-07-08 MED FILL — ?OMEPRAZOLE 20MG CAP DR: 20 | 30 days supply | Qty: 60 | Fill #2

## 2019-07-08 MED FILL — ?FUROSEMIDE 80MG TABS: 80 | 30 days supply | Qty: 30 | Fill #1

## 2019-07-08 MED FILL — ?POTASSIUM CL ER 10 MEQ TAB: 10 MEQ | 30 days supply | Qty: 60 | Fill #2

## 2019-07-08 MED FILL — LOSARTAN POTASSIUM 100 MG T: 100 | 30 days supply | Qty: 30 | Fill #3

## 2019-07-09 NOTE — Telephone Encounter (Signed)
I'll just see her at follow up

## 2019-07-16 ENCOUNTER — Ambulatory Visit: Payer: No Typology Code available for payment source | Admitting: Podiatry

## 2019-07-22 ENCOUNTER — Ambulatory Visit (INDEPENDENT_AMBULATORY_CARE_PROVIDER_SITE_OTHER): Payer: Self-pay | Admitting: Neurology

## 2019-07-22 ENCOUNTER — Other Ambulatory Visit: Payer: Self-pay

## 2019-07-22 DIAGNOSIS — G629 Polyneuropathy, unspecified: Secondary | ICD-10-CM

## 2019-07-22 NOTE — Procedures (Signed)
[facility]  [address] [place], [address]  [place], [place] [address] Tel: [phone] Fax:  [phone] Test Date:  07/22/2019  Patient: Jill Shaw DOB: 08/21/1976 Physician: Nita Sickle, DO  Sex: Female Height: 5\' 5"  Ref Phys: , FNP  ID#: Mike Gip Temp: 34.0C Technician:    Patient Complaints: This is a 43 year old female with diabetes mellitus referred for evaluation of generalized numbness and tingling  NCV & EMG Findings: Extensive electrodiagnostic testing of the right upper and lower extremity shows:  1. Right median sensory response shows prolonged distal peak latency (4.5 ms) and reduced amplitude (15.0 V).  Right radial and ulnar sensory responses show reduced amplitudes (R16.8, R8.3 V).   2. Right sural and superficial peroneal sensory responses are absent. 3. Right median, ulnar, and peroneal motor responses are within normal limits.  Right tibial motor response shows reduced amplitude (3.1 mV). 4. Tibial H reflex study is within normal limits. 5. Sparse chronic motor axonal loss changes are seen in the right flexor digitorum longus muscles.  There is no evidence of accompanied active denervation in any of the tested muscles.  Impression: 1. 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 2. A superimposed right carpal tunnel syndrome (mild) is also likely, correlate clinically.   ___________________________ 46, DO    Nerve Conduction Studies Anti Sensory Summary Table   Site NR Peak (ms) Norm Peak (ms) P-T Amp (V) Norm P-T Amp  Right Median Anti Sensory (2nd Digit)  34C  Wrist    4.5 <3.4 15.0 >20  Right Radial Anti Sensory (Base 1st Digit)  34C  Wrist    2.0 <2.7 16.8 >18  Right Sup Peroneal Anti Sensory (Ant Lat Mall)  34C  12 cm NR  <4.5  >5  Right Sural Anti Sensory (Lat Mall)  34C  Calf NR  <4.5  >5  Right Ulnar Anti  Sensory (5th Digit)  34C  Wrist    2.5 <3.1 8.3 >12   Motor Summary Table   Site NR Onset (ms) Norm Onset (ms) O-P Amp (mV) Norm O-P Amp Site1 Site2 Delta-0 (ms) Dist (cm) Vel (m/s) Norm Vel (m/s)  Right Median Motor (Abd Poll Brev)  34C  Wrist    3.8 <3.9 9.5 >6 Elbow Wrist 5.2 28.0 54 >50  Elbow    9.0  8.7         Right Peroneal Motor (Ext Dig Brev)  34C  Ankle    1.6 <5.5 3.2 >3 B Fib Ankle 9.9 40.0 40 >40  B Fib    11.5  2.0  Poplt B Fib 1.5 8.0 53 >40  Poplt    13.0  1.8         Right Tibial Motor (Abd Hall Brev)  34C  Ankle    3.2 <6.0 3.1 >8 Knee Ankle 9.2 40.0 43 >40  Knee    12.4  2.0         Right Ulnar Motor (Abd Dig Minimi)  34C  Wrist    2.0 <3.1 8.4 >7 B Elbow Wrist 4.1 22.0 54 >50  B Elbow    6.1  7.2  A Elbow B Elbow 2.0 10.0 50 >50  A Elbow    8.1  7.0          H Reflex Studies   NR H-Lat (ms) Lat Norm (ms) L-R H-Lat (ms)  Right Tibial (Gastroc)  34C     34.29 <  35    EMG   Side Muscle Ins Act Fibs Psw Fasc Number Recrt Dur Dur. Amp Amp. Poly Poly. Comment  Right 1stDorInt Nml Nml Nml Nml Nml Nml Nml Nml Nml Nml Nml Nml N/A  Right AntTibialis Nml Nml Nml Nml Nml Nml Nml Nml Nml Nml Nml Nml N/A  Right Gastroc Nml Nml Nml Nml 1- Mod-V Nml Nml Nml Nml Nml Nml N/A  Right Flex Dig Long Nml Nml Nml Nml 1- Rapid Few 1+ Few 1+ Nml Nml N/A  Right RectFemoris Nml Nml Nml Nml Nml Nml Nml Nml Nml Nml Nml Nml N/A  Right GluteusMed Nml Nml Nml Nml Nml Nml Nml Nml Nml Nml Nml Nml N/A  Right BicepsFemS Nml Nml Nml Nml Nml Nml Nml Nml Nml Nml Nml Nml N/A  Right Abd Poll Brev Nml Nml Nml Nml Nml Nml Nml Nml Nml Nml Nml Nml N/A  Right Ext Indicis Nml Nml Nml Nml Nml Nml Nml Nml Nml Nml Nml Nml N/A  Right PronatorTeres Nml Nml Nml Nml Nml Nml Nml Nml Nml Nml Nml Nml N/A  Right Triceps Nml Nml Nml Nml Nml Nml Nml Nml Nml Nml Nml Nml N/A  Right Biceps Nml Nml Nml Nml Nml Nml Nml Nml Nml Nml Nml Nml N/A  Right Deltoid Nml Nml Nml Nml Nml Nml Nml Nml Nml Nml Nml Nml N/A       Waveforms:

## 2019-07-23 ENCOUNTER — Ambulatory Visit (HOSPITAL_COMMUNITY)
Admission: RE | Admit: 2019-07-23 | Discharge: 2019-07-23 | Disposition: A | Discharge status: Home / Self Care | Payer: Self-pay | Source: Ambulatory Visit | Attending: Family Medicine | Admitting: Family Medicine

## 2019-07-23 DIAGNOSIS — G8929 Other chronic pain: Secondary | ICD-10-CM

## 2019-07-23 DIAGNOSIS — M25561 Pain in right knee: Secondary | ICD-10-CM

## 2019-07-23 DIAGNOSIS — M25562 Pain in left knee: Secondary | ICD-10-CM | POA: Insufficient documentation

## 2019-07-24 ENCOUNTER — Encounter (HOSPITAL_COMMUNITY): Payer: Self-pay | Admitting: *Deleted

## 2019-07-24 NOTE — Progress Notes (Signed)
Spoke with pt for pre-op call. Pt denies cardiac history. Pt does have HTN and Type 2 Diabetes. Last A1C was 7.9 in August. Pt states her fasting blood sugar is usually around 160. Pt instructed to take 70% of her Humalog 75/25 Insulin Monday evening, she will take 70 units. Pt instructed to check her blood sugar when she gets up Tuesday AM. If blood sugar is 70 or below, treat with 1/2 cup of clear juice (apple or cranberry) and recheck blood sugar 15 minutes after drinking juice. Pt instructed not to take her Humalog 75/25 or Saxagliptin Tuesday AM. Pt voiced understanding.   Pt will get her Covid test done on 07/26/19. Quarantine instructions given and pt voiced understanding. Pt also voiced understanding of visitor policy.

## 2019-07-26 ENCOUNTER — Ambulatory Visit (HOSPITAL_COMMUNITY): Payer: Self-pay

## 2019-07-28 NOTE — Progress Notes (Deleted)
Patient did not show for covid drive thru appt on Saturday. Called patient this am to reschedule. Mailbox full. Sent SMS message.

## 2019-07-29 ENCOUNTER — Ambulatory Visit (HOSPITAL_COMMUNITY): Payer: Self-pay

## 2019-07-29 ENCOUNTER — Ambulatory Visit (HOSPITAL_COMMUNITY): Admission: RE | Admit: 2019-07-29 | Payer: Self-pay | Source: Ambulatory Visit

## 2019-07-29 ENCOUNTER — Telehealth: Payer: Self-pay | Admitting: Pulmonary Disease

## 2019-07-29 NOTE — Telephone Encounter (Signed)
LMTCB.   Patient OV and PFT will need to be r/s due to patient not having Covid testing. If patient had done somewhere else, she will need to get the results made available to [place].

## 2019-07-29 NOTE — Telephone Encounter (Signed)
LM on pt's VM to call me about the COVID Test & to r/s PFT & ROV w/ Dr. Valeta Harms tomorrow, 10/14.

## 2019-07-30 ENCOUNTER — Ambulatory Visit: Payer: Self-pay | Admitting: Pulmonary Disease

## 2019-07-30 NOTE — Telephone Encounter (Signed)
LM on pt's VM to call me to r/s PFT & ROV as pt did not have COVID test.

## 2019-07-31 NOTE — Telephone Encounter (Signed)
We have attempted to contact pt several times with no success or call back from pt. Per triage protocol, message will be closed.  

## 2019-08-04 ENCOUNTER — Telehealth: Payer: Self-pay | Admitting: Internal Medicine

## 2019-08-05 ENCOUNTER — Other Ambulatory Visit: Payer: Self-pay

## 2019-08-05 ENCOUNTER — Ambulatory Visit (INDEPENDENT_AMBULATORY_CARE_PROVIDER_SITE_OTHER): Payer: Self-pay | Admitting: Family Medicine

## 2019-08-05 ENCOUNTER — Encounter: Payer: Self-pay | Admitting: Family Medicine

## 2019-08-05 ENCOUNTER — Ambulatory Visit: Payer: Self-pay | Admitting: Family Medicine

## 2019-08-05 ENCOUNTER — Telehealth: Payer: Self-pay | Admitting: Neurology

## 2019-08-05 VITALS — BP 127/46 | HR 110 | Temp 98.5°F | Resp 18 | Ht 64.0 in | Wt 281.0 lb

## 2019-08-05 DIAGNOSIS — F17200 Nicotine dependence, unspecified, uncomplicated: Secondary | ICD-10-CM

## 2019-08-05 DIAGNOSIS — I10 Essential (primary) hypertension: Secondary | ICD-10-CM

## 2019-08-05 DIAGNOSIS — E119 Type 2 diabetes mellitus without complications: Secondary | ICD-10-CM

## 2019-08-05 DIAGNOSIS — J449 Chronic obstructive pulmonary disease, unspecified: Secondary | ICD-10-CM

## 2019-08-05 DIAGNOSIS — Z794 Long term (current) use of insulin: Secondary | ICD-10-CM

## 2019-08-05 DIAGNOSIS — G629 Polyneuropathy, unspecified: Secondary | ICD-10-CM

## 2019-08-05 DIAGNOSIS — G8929 Other chronic pain: Secondary | ICD-10-CM

## 2019-08-05 DIAGNOSIS — M25561 Pain in right knee: Secondary | ICD-10-CM

## 2019-08-05 DIAGNOSIS — D638 Anemia in other chronic diseases classified elsewhere: Secondary | ICD-10-CM

## 2019-08-05 DIAGNOSIS — R000 Tachycardia, unspecified: Secondary | ICD-10-CM

## 2019-08-05 DIAGNOSIS — M62838 Other muscle spasm: Secondary | ICD-10-CM

## 2019-08-05 LAB — POCT URINALYSIS DIPSTICK
Bilirubin, UA: NEGATIVE
Blood, UA: NEGATIVE
Glucose, UA: NEGATIVE
Ketones, UA: NEGATIVE
Leukocytes, UA: NEGATIVE
Nitrite, UA: NEGATIVE
Protein, UA: NEGATIVE
Spec Grav, UA: 1.02 (ref 1.010–1.025)
Urobilinogen, UA: 0.2 E.U./dL
pH, UA: 7.5 (ref 5.0–8.0)

## 2019-08-05 LAB — POCT GLYCOSYLATED HEMOGLOBIN (HGB A1C): Hemoglobin A1C: 8.4 % — AB (ref 4.0–5.6)

## 2019-08-05 MED ORDER — CYCLOBENZAPRINE HCL 5 MG PO TABS: 5.0000 mg | ORAL_TABLET | Freq: Three times a day (TID) | ORAL | 1 refills | Status: DC | PRN

## 2019-08-05 MED FILL — CYCLOBENZAPRINE 5 MG TABLET: 5 | 10 days supply | Qty: 30 | Fill #0

## 2019-08-05 NOTE — Telephone Encounter (Signed)
Pt comeing onto office today

## 2019-08-05 NOTE — Telephone Encounter (Signed)
Patient called in that Dr. Nathaneil Canary never received the results from EMG. Please fax them results or call patient. Thanks!

## 2019-08-05 NOTE — Progress Notes (Signed)
Established Patient Office Visit  Subjective:  Patient ID: Jill Shaw, female    DOB: 07/02/1976  Age: 43 y.o. MRN: [id number]  CC:  Chief Complaint  Patient presents with  . Tachycardia  . Follow-up    review xrays    HPI Jill Shaw, a 43 year old female with a medical history significant for essential hypertension, COPD, irritable bowel syndrome with constipation, type 2 diabetes mellitus, uncontrolled on insulin, history of tobacco dependence, history of seizure disorder, history of morbid obesity, chronic pain, menorrhagia with irregular menstrual cycle, and history of polyneuropathy presents for a follow-up of chronic conditions. Patient says that she was previously referred to neurology and underwent testing.  She states that she does not know test results and is inquiring at this time.  She says that she continues to experience numbness and tingling to bilateral upper and lower extremities.  Patient states that she is not on medication to assist with this problem.  She is attempted pregabalin, amitriptyline, and gabapentin in the past without success, says she was previously referred to neurology by PCP.  Patient is also complaining of muscle spasms to low back and lower extremities.  She states that she was taken off of cyclobenzaprine several months ago and is unsure of the reason why it was discontinued. Patient also has a history of uncontrolled type 2 diabetes mellitus.  She is on Humalog mix 75/25 and Onglyza.  She says that she has been taking medications consistently.  However, she has not been following a carbohydrate diet or exercising.  She denies polydipsia, polyphagia, or polyuria.  She also denies blurred vision, or dizziness.  She says that she is up-to-date with eye exam.  She also checks her feet consistently. Patient also continues to complain of chronic right knee pain.  Her current pain intensity is 7/10.  She is not taking any medications for this problem.   She also has not attempted any over-the-counter interventions to assist.  She was previously under the care of orthopedics when in [place], but has not had a referral since relocating to this area.  Patient states that right knee pain is worsened with weightbearing.  She has attempted to lose weight to assist with this problem without success.  Patient can be quite difficult to follow due to long medical history and multiple complaints.  Patient has also had a great deal of referrals over the past year.  She is inquiring about information from specialist.  Past Medical History:  Diagnosis Date  . Anemia   . Arthritis    knees, hands  . Asthma   . COPD (chronic obstructive pulmonary disease) ([place])   . Diabetes mellitus without complication ([place])    type 2  . GERD (gastroesophageal reflux disease)   . Hypertension   . Neuromuscular disorder (HCC)    neuropathy feet  . Seizures ([place])    pt states r/t stress and blood sugar - no meds last one 4 months ago, not seen neurologist  . Sickle cell trait ([place])   . Smoker     Past Surgical History:  Procedure Laterality Date  . CESAREAN SECTION     x 1  . EYE SURGERY Bilateral    laser right and cataract removed left eye  . UPPER GI ENDOSCOPY  07/2017    Family History  Problem Relation Age of Onset  . Diabetes Mother   . Hypertension Mother     Social History   Socioeconomic History  . Marital status:  Legally Separated    Spouse name: Not on file  . Number of children: Not on file  . Years of education: Not on file  . Highest education level: Not on file  Occupational History  . Not on file  Social Needs  . Financial resource strain: Not on file  . Food insecurity    Worry: Not on file    Inability: Not on file  . Transportation needs    Medical: Not on file    Non-medical: Not on file  Tobacco Use  . Smoking status: Current Every Day Smoker    Packs/day: 0.25    Years: 15.00    Pack years: 3.75    Types:  Cigarettes  . Smokeless tobacco: Never Used  Substance and Sexual Activity  . Alcohol use: No  . Drug use: No  . Sexual activity: Not Currently    Birth control/protection: None  Lifestyle  . Physical activity    Days per week: Not on file    Minutes per session: Not on file  . Stress: Not on file  Relationships  . Social Herbalist on phone: Not on file    Gets together: Not on file    Attends religious service: Not on file    Active member of club or organization: Not on file    Attends meetings of clubs or organizations: Not on file    Relationship status: Not on file  . Intimate partner violence    Fear of current or ex partner: Not on file    Emotionally abused: Not on file    Physically abused: Not on file    Forced sexual activity: Not on file  Other Topics Concern  . Not on file  Social History Narrative  . Not on file    Outpatient Medications Prior to Visit  Medication Sig Dispense Refill  . albuterol (PROVENTIL HFA;VENTOLIN HFA) 108 (90 Base) MCG/ACT inhaler Inhale 2 puffs into the lungs every 6 (six) hours as needed for wheezing or shortness of breath. 1 Inhaler 0  . albuterol (PROVENTIL) (2.5 MG/3ML) 0.083% nebulizer solution Take 3 mLs (2.5 mg total) by nebulization every 6 (six) hours as needed for wheezing or shortness of breath. 150 mL 1  . Blood Glucose Monitoring Suppl (TRUE METRIX METER) w/Device KIT 1 each by Does not apply route 4 (four) times daily -  before meals and at bedtime. 1 kit 0  . budesonide-formoterol (SYMBICORT) 160-4.5 MCG/ACT inhaler Inhale 2 puffs into the lungs 2 (two) times daily. 1 Inhaler 3  . cetirizine (ZYRTEC) 10 MG tablet Take 1 tablet (10 mg total) by mouth daily. 30 tablet 11  . cholecalciferol (VITAMIN D) 1000 units tablet Take 1,000 Units by mouth daily.    . ferrous sulfate 325 (65 FE) MG tablet Take 1 tablet (325 mg total) by mouth 3 (three) times daily with meals. 90 tablet 0  . fluticasone (FLONASE) 50 MCG/ACT  nasal spray Place 2 sprays into both nostrils daily. 16 g 6  . furosemide (LASIX) 80 MG tablet Take 1 tablet (80 mg total) by mouth daily. 90 tablet 1  . glucose blood (TRUE METRIX BLOOD GLUCOSE TEST) test strip Use as instructed 100 each 12  . Hydrocortisone (CORTIZONE-10 ECZEMA EX) Apply topically.    . hydrocortisone 2.5 % cream Apply topically 2 (two) times daily. 30 g 0  . Insulin Lispro Prot & Lispro (HUMALOG MIX 75/25 KWIKPEN) (75-25) 100 UNIT/ML Kwikpen INJECT 100 UNITS EVERY 12 HOURS  15 mL 11  . Insulin Pen Needle (PEN NEEDLES) 30G X 5 MM MISC 1 Units by Does not apply route 2 (two) times a day. 100 each 1  . losartan (COZAAR) 100 MG tablet Take 1 tablet (100 mg total) by mouth daily. 90 tablet 3  . metoprolol succinate (TOPROL-XL) 25 MG 24 hr tablet Take 1 tablet (25 mg total) by mouth daily. 30 tablet 5  . omeprazole (PRILOSEC) 40 MG capsule Take 1 capsule (40 mg total) by mouth daily. 30 capsule 3  . potassium chloride SA (K-DUR) 20 MEQ tablet Take 1 tablet (20 mEq total) by mouth daily. 30 tablet 3  . saxagliptin HCl (ONGLYZA) 2.5 MG TABS tablet Take 1 tablet (2.5 mg total) by mouth daily. 90 tablet 3   No facility-administered medications prior to visit.     Allergies  Allergen Reactions  . Ketoprofen Nausea And Vomiting  . Aspirin Nausea Only  . Cortizone-10 [Hydrocortisone] Nausea Only  . Gabapentin Nausea And Vomiting    upset stomach  . Ibuprofen Nausea Only and Nausea And Vomiting  . Liraglutide Nausea And Vomiting  . Naproxen Nausea And Vomiting  . Omeprazole-Sodium Bicarbonate Nausea And Vomiting  . Other Nausea And Vomiting  . Sulfa Antibiotics Nausea And Vomiting  . Tramadol Nausea And Vomiting    stomach upset    ROS Review of Systems  Constitutional: Positive for unexpected weight change (Weight gain). Negative for activity change and appetite change.  HENT: Negative.   Eyes: Negative for discharge and itching.  Respiratory: Positive for apnea and  shortness of breath. Negative for chest tightness.   Cardiovascular: Positive for leg swelling. Negative for chest pain and palpitations.  Gastrointestinal: Negative.  Negative for blood in stool.  Endocrine: Negative for cold intolerance, heat intolerance, polydipsia, polyphagia and polyuria.  Genitourinary: Negative.   Musculoskeletal: Positive for arthralgias and back pain.  Neurological: Positive for weakness, numbness (bilateral hands) and headaches. Negative for dizziness and syncope.      Objective:    Physical Exam  Constitutional: She is oriented to person, place, and time.  Morbid obesity  Eyes: Pupils are equal, round, and reactive to light. Right pupil is reactive.  Cardiovascular: Normal rate, regular rhythm and normal pulses.  Pulmonary/Chest: Effort normal and breath sounds normal.  Abdominal: There is no abdominal tenderness.  Abdominal obesity, pendulous   Musculoskeletal:     Right knee: She exhibits decreased range of motion and swelling. She exhibits no effusion, no deformity and no erythema.     Lumbar back: She exhibits decreased range of motion.  Neurological: She is alert and oriented to person, place, and time. She has normal strength. Gait normal.    BP (!) 127/46 (BP Location: Left Arm, Patient Position: Sitting, Cuff Size: Large)   Pulse (!) 110   Temp 98.5 F (36.9 C) (Oral)   Resp 18   Ht _0  (1.626 m)   Wt 281 lb (127.5 kg)   LMP 07/23/2019   SpO2 100%   BMI 48.23 kg/m  Wt Readings from Last 3 Encounters:  08/05/19 281 lb (127.5 kg)  07/03/19 281 lb (127.5 kg)  06/06/19 278 lb (126.1 kg)     Health Maintenance Due  Topic Date Due  . FOOT EXAM  09/22/2017  . OPHTHALMOLOGY EXAM  11/16/2017    There are no preventive care reminders to display for this patient.  Lab Results  Component Value Date   TSH 1.00 03/09/2017   Lab Results  Component Value  Date   WBC 14.8 (H) 05/22/2019   HGB 8.9 (L) 05/22/2019   HCT 31.1 (L) 05/22/2019    MCV 73.2 (L) 05/22/2019   PLT 295 05/22/2019   Lab Results  Component Value Date   NA 141 06/06/2019   K 4.0 06/06/2019   CO2 21 06/06/2019   GLUCOSE 96 06/06/2019   BUN 7 06/06/2019   CREATININE 0.93 06/06/2019   BILITOT 0.2 06/06/2019   ALKPHOS 100 06/06/2019   AST 72 (H) 06/06/2019   ALT 25 06/06/2019   PROT 6.8 06/06/2019   ALBUMIN 4.2 06/06/2019   CALCIUM 9.1 06/06/2019   ANIONGAP 14 05/22/2019   Lab Results  Component Value Date   CHOL 136 03/09/2017   Lab Results  Component Value Date   HDL 55 03/09/2017   Lab Results  Component Value Date   LDLCALC 52 03/09/2017   Lab Results  Component Value Date   TRIG 144 03/09/2017   Lab Results  Component Value Date   CHOLHDL 2.5 03/09/2017   Lab Results  Component Value Date   HGBA1C 7.9 (A) 06/06/2019      Assessment & Plan:   Problem List Items Addressed This Visit      Cardiovascular and Mediastinum   Essential hypertension   Relevant Orders   Basic Metabolic Panel   Urinalysis Dipstick     Endocrine   Type 2 diabetes mellitus without complication, with long-term current use of insulin (HCC)   Relevant Orders   Basic Metabolic Panel   HgB Y8X     Other   Tobacco dependence   Morbid obesity ([place])   Anemia of chronic disease   Relevant Orders   CBC    Other Visit Diagnoses    Muscle spasms of both lower extremities    -  Primary   Relevant Medications   cyclobenzaprine (FLEXERIL) 5 MG tablet   Neuropathy          Meds ordered this encounter  Medications  . cyclobenzaprine (FLEXERIL) 5 MG tablet    Sig: Take 1 tablet (5 mg total) by mouth 3 (three) times daily as needed for muscle spasms.    Dispense:  30 tablet    Refill:  1    Order Specific Question:   Supervising Provider    Answer:   Tresa Garter [id number]     Muscle spasms of both lower extremities Patient continues to have muscle spasms periodically.  Will restart cyclobenzaprine.  - cyclobenzaprine (FLEXERIL) 5  MG tablet; Take 1 tablet (5 mg total) by mouth 3 (three) times daily as needed for muscle spasms.  Dispense: 30 tablet; Refill: 1  Morbid obesity ([place]) The patient is asked to make an attempt to improve diet and exercise patterns to aid in medical management of this problem. Estimated body mass index is 48.23 kg/m as calculated from the following:   Height as of this encounter: _0  (1.626 m).   Weight as of this encounter: 281 lb (127.5 kg).   Neuropathy Patient has a history of fibromyalgia and neuropathy.  She was evaluated by Dr. Posey Pronto, neurology.  She was referred for generalized numbness and tingling.  Patient underwent extensive electrodiagnostic testing of the right upper and lower extremities.  Results were consistent with a sensorimotor axonal polyneuropathy affecting the right upper and lower extremities.  Also, patient has a superimposed right carpal tunnel syndrome.  Discussed findings with patient at length.  Patient is requesting medication to assist further with this problem.  She  has been on gabapentin, amitriptyline, and Lyrica over the past several years.  She states that nothing has been effective.  I advised patient that I will defer to neurology for the management of this problem being that all of the medications that she is attempted in primary care has been ineffective.  Patient does not have follow-up scheduled with neurology, advised to schedule.  Essential hypertension Blood pressure controlled on current medication regimen.  No changes on today.  Patient advised to follow low-sodium diet.  Also, continue to check blood pressures at home. - Basic Metabolic Panel - Urinalysis Dipstick  Type 2 diabetes mellitus without complication, with long-term current use of insulin (HCC) Hemoglobin A1c increased to 8.4, which is increased from previous.  Patient states that she has not been following a carbohydrate modified diet.  Also, she states that she has been an active due to  increased generalized pain. - Basic Metabolic Panel - HgB K3T  Anemia of chronic disease Patient's previous hemoglobin was 8.9, which appears to be her baseline.  We will repeat on today.  Discussed sources of iron with patient at length. - CBC  Tobacco dependence Smoking cessation instruction/counseling given:  counseled patient on the dangers of tobacco use, advised patient to stop smoking, and reviewed strategies to maximize success  Chronic obstructive pulmonary disease, unspecified COPD type ([place]) - Split night study; Future  Chronic pain of right knee Recommend Tylenol 500 mg every 6 hours for mild to moderate right knee pain.  Also, patient advised to avoid nephrotoxic medications such as ibuprofen and naproxen due to long history of hypertension and uncontrolled diabetes mellitus. - AMB referral to orthopedics     Follow-up: Return in about 3 months (around 11/05/2019) for hypertension, diabetes, fibromyalgia.    Spent greater than 30 minutes reviewing medical records, discussing previous tests, and laboratory results. The patient was given clear instructions to go to ER or return to medical center if symptoms do not improve, worsen or new problems develop. The patient verbalized understanding.    Donia Pounds  APRN, MSN, FNP-C [facility] [address] [place], [place] [address] [phone]

## 2019-08-05 NOTE — Telephone Encounter (Signed)
Results were automatically routed and should have been rec'd as they are available in [facility].  I will go ahead resend to Lanae Boast, [place].

## 2019-08-05 NOTE — Telephone Encounter (Signed)
Done

## 2019-08-06 ENCOUNTER — Telehealth: Payer: Self-pay | Admitting: Family Medicine

## 2019-08-06 LAB — BASIC METABOLIC PANEL
BUN/Creatinine Ratio: 13 (ref 9–23)
BUN: 12 mg/dL (ref 6–24)
CO2: 23 mmol/L (ref 20–29)
Calcium: 9.9 mg/dL (ref 8.7–10.2)
Chloride: 101 mmol/L (ref 96–106)
Creatinine, Ser: 0.96 mg/dL (ref 0.57–1.00)
GFR calc Af Amer: 84 mL/min/{1.73_m2} (ref >59)
GFR calc non Af Amer: 73 mL/min/{1.73_m2} (ref >59)
Glucose: 110 mg/dL — ABNORMAL HIGH (ref 65–99)
Potassium: 4.2 mmol/L (ref 3.5–5.2)
Sodium: 138 mmol/L (ref 134–144)

## 2019-08-06 LAB — CBC
Hematocrit: 29.7 % — ABNORMAL LOW (ref 34.0–46.6)
Hemoglobin: 8.9 g/dL — ABNORMAL LOW (ref 11.1–15.9)
MCH: 21.8 pg — ABNORMAL LOW (ref 26.6–33.0)
MCHC: 30 g/dL — ABNORMAL LOW (ref 31.5–35.7)
MCV: 73 fL — ABNORMAL LOW (ref 79–97)
Platelets: 303 10*3/uL (ref 150–450)
RBC: 4.08 x10E6/uL (ref 3.77–5.28)
RDW: 20.6 % — ABNORMAL HIGH (ref 11.7–15.4)
WBC: 10 10*3/uL (ref 3.4–10.8)

## 2019-08-06 NOTE — Telephone Encounter (Signed)
Jill Shaw, a 43 year old female with a medical history significant for chronic pain syndrome, type 2 diabetes mellitus, essential hypertension, peripheral neuropathy, morbid obesity, and anemia of chronic disease presented on 08/05/2019 for a follow-up appointment for chronic conditions. All laboratory values reviewed.  Hemoglobin 8.9, consistent with previous.  Patient to continue ferrous sulfate.  We will add on iron studies and follow-up with patient as results are reviewed.  Hemoglobin A1c is 8.4, elevated from previous.  Recommend that patient continue his current medication regimen.  Also, a carbohydrate modified diet divided over 5-6 small meals throughout the day.  Also, recommend the patient loses 5% of body weight over the next 3 months.  Her BMI is currently 48.  Patient and I discussed diet at length during appointment, she expressed understanding.  Also, increase water intake.   Follow-up in 3 months as scheduled.  Donia Pounds  APRN, MSN, FNP-C [name] [address] [place], [place] [address] [phone]

## 2019-08-07 ENCOUNTER — Telehealth: Payer: Self-pay | Admitting: Internal Medicine

## 2019-08-08 ENCOUNTER — Ambulatory Visit (INDEPENDENT_AMBULATORY_CARE_PROVIDER_SITE_OTHER): Payer: Self-pay | Admitting: Obstetrics & Gynecology

## 2019-08-08 ENCOUNTER — Encounter: Payer: Self-pay | Admitting: Obstetrics & Gynecology

## 2019-08-08 ENCOUNTER — Other Ambulatory Visit: Payer: Self-pay

## 2019-08-08 VITALS — BP 110/71 | HR 103 | Wt 282.8 lb

## 2019-08-08 DIAGNOSIS — N920 Excessive and frequent menstruation with regular cycle: Secondary | ICD-10-CM

## 2019-08-08 DIAGNOSIS — Z0000 Encounter for general adult medical examination without abnormal findings: Secondary | ICD-10-CM

## 2019-08-08 MED ORDER — MEGESTROL ACETATE 40 MG PO TABS: 40.0000 mg | ORAL_TABLET | Freq: Two times a day (BID) | ORAL | 5 refills | Status: DC

## 2019-08-08 MED FILL — MEGESTROL 40 MG TABLET: 40 | 30 days supply | Qty: 60 | Fill #0

## 2019-08-08 NOTE — Progress Notes (Signed)
   Subjective:    Patient ID: Jill Shaw, female    DOB: 08-24-1976, 43 y.o.   MRN: [id number]  HPI  43 yo single P3 here today with continued heavy periods, regular monthly but lasting 10 days per month. Her hbg this month was 8.9. She is taking iron pills daily. She has been scheduled for a d&c and Minerva ablation twice and has cancelled twice. She wants to reschedule this. She is aware that if her sugar is more than 250 or her BP is too elevated that her surgery will be canceled. She decline IV iron infusion.   Review of Systems Pap smear is UTD. She had her flu vaccine this month.    Objective:   Physical Exam Breathing, conversing, and ambulating normally Well nourished, well hydrated Black female, no apparent distress Abd- benign, morbidly obese    Assessment & Plan:  Menorrhagia with regular cycle and anemia- start megace 40 mg BID I sent [place] message to schedule d&c/ablation. Mammogram ordered

## 2019-08-10 NOTE — Patient Instructions (Signed)

## 2019-08-11 ENCOUNTER — Telehealth: Payer: Self-pay | Admitting: Neurology

## 2019-08-11 ENCOUNTER — Other Ambulatory Visit: Payer: Self-pay

## 2019-08-11 ENCOUNTER — Telehealth: Payer: Self-pay | Admitting: Family Medicine

## 2019-08-11 ENCOUNTER — Ambulatory Visit: Payer: No Typology Code available for payment source | Admitting: Podiatry

## 2019-08-11 ENCOUNTER — Telehealth: Payer: Self-pay

## 2019-08-11 DIAGNOSIS — M778 Other enthesopathies, not elsewhere classified: Secondary | ICD-10-CM

## 2019-08-11 DIAGNOSIS — L600 Ingrowing nail: Secondary | ICD-10-CM

## 2019-08-11 DIAGNOSIS — Q6689 Other  specified congenital deformities of feet: Secondary | ICD-10-CM

## 2019-08-11 NOTE — Telephone Encounter (Signed)
Patient called for labs results collected on 08/05/2019. Also per patient she was told by her Neurologist you would review her EMG  results.   Please  review & advise.

## 2019-08-11 NOTE — Telephone Encounter (Signed)
EMG results are under "Procedures" tab in epic dated 07/22/2019 [facility] with EMG. I will route this note to Lanae Boast, FNP and reroute the results also.

## 2019-08-11 NOTE — Telephone Encounter (Signed)
done

## 2019-08-11 NOTE — Telephone Encounter (Signed)
Patient states that she is trying to get the results of the EMG and Dr Nathaneil Canary office states that they can not see the results can you please resend them to his office

## 2019-08-11 NOTE — Telephone Encounter (Signed)
I have made 3 attempts to call patient to discuss lab and imaging results.  The next step is to send patient a letter with lab results.  Donia Pounds  APRN, MSN, FNP-C [facility] [address] [place], [place] [address] [phone]

## 2019-08-12 ENCOUNTER — Telehealth: Payer: Self-pay

## 2019-08-12 MED FILL — LOSARTAN POTASSIUM 100 MG T: 100 | 30 days supply | Qty: 30 | Fill #4

## 2019-08-12 MED FILL — ?METOPROLOL SUCC ER 25MG TA: 25 | 30 days supply | Qty: 30 | Fill #2

## 2019-08-12 NOTE — Telephone Encounter (Signed)
Patient called, says you have been trying to contact her with results. Is there anything you want me to advise her on, or can you call her again?

## 2019-08-12 NOTE — Telephone Encounter (Signed)
No longer this patient's PCP

## 2019-08-13 ENCOUNTER — Ambulatory Visit: Payer: Self-pay | Admitting: Family Medicine

## 2019-08-13 NOTE — Telephone Encounter (Signed)
I understand, but since you are the ordering provider, the results of the EMG you have ordered will still need to be communicated back to patient by your office.

## 2019-08-14 NOTE — Progress Notes (Signed)
   HPI: 43 year old female presents to office today for follow up evaluation of bilateral foot pain. She states the right foot pain is worse than the left. She has taken a Medrol Dose Pak and used compression anklets with no significant relief. There are no alleviating factors noted.  She also complains of intermittent pain to the medial border of the right hallux that began about one year ago. She is concerned for a possible ingrown nail. She has been trimming the nail for treatment. Wearing shoes and applying pressure to the toe increases the pain. Patient is here for further evaluation and treatment.   Past Medical History:  Diagnosis Date  . Anemia   . Arthritis    knees, hands  . Asthma   . COPD (chronic obstructive pulmonary disease) ([place])   . Diabetes mellitus without complication ([place])    type 2  . GERD (gastroesophageal reflux disease)   . Hypertension   . Neuromuscular disorder (HCC)    neuropathy feet  . Seizures ([place])    pt states r/t stress and blood sugar - no meds last one 4 months ago, not seen neurologist  . Sickle cell trait ([place])   . Smoker       Physical Exam: General: The patient is alert and oriented x3 in no acute distress.  Dermatology:  Medial border right hallux appears to be erythematous with evidence of an ingrowing nail. Pain on palpation noted to the border of the nail fold. Skin is warm, dry and supple bilateral lower extremities. Negative for open lesions or macerations.  Vascular: Palpable pedal pulses bilaterally. No edema or erythema noted. Capillary refill within normal limits.  Neurological: Epicritic and protective threshold grossly intact bilaterally.   Musculoskeletal Exam: Pain on palpation noted along the posterior tibial tendon of the right lower extremity. Limited range of motion noted with inversion and eversion of the subtalar joint right lower extremity.  Pain with palpation noted to the bilateral feet.  Muscle strength 5/5 in all  groups bilateral.    Assessment: 1. Subtalar joint coalition-middle facet right 2. Capsulitis bilateral  3. Paronychia with ingrowing nail medial border right hallux  4. Pain in toe 5. Incurvated nail    Plan of Care:  1. Patient was evaluated.  2. Mechanical debridement of the right great toenail performed using a nail nipper. Filed with dremel without incident.  3. Recommended OTC arch supports. Patient has not gotten a pair yet.  4. Allergic to NSAIDs. Recommended OTC Tylenol.  5. Return to clinic as needed.   Edrick Kins, DPM [facility]  Dr. Edrick Kins, DPM    [address] [place], [place] [address]                Office [phone]  Fax [phone]

## 2019-08-19 ENCOUNTER — Encounter (HOSPITAL_COMMUNITY): Payer: Self-pay | Admitting: Vascular Surgery

## 2019-08-19 ENCOUNTER — Ambulatory Visit: Payer: Self-pay | Admitting: Family Medicine

## 2019-08-19 ENCOUNTER — Other Ambulatory Visit: Payer: Self-pay

## 2019-08-19 ENCOUNTER — Other Ambulatory Visit (HOSPITAL_COMMUNITY)
Admission: RE | Admit: 2019-08-19 | Discharge: 2019-08-19 | Disposition: A | Discharge status: Home / Self Care | Payer: Self-pay | Source: Ambulatory Visit | Attending: Obstetrics & Gynecology | Admitting: Obstetrics & Gynecology

## 2019-08-19 DIAGNOSIS — Z01812 Encounter for preprocedural laboratory examination: Secondary | ICD-10-CM | POA: Insufficient documentation

## 2019-08-19 DIAGNOSIS — Z20828 Contact with and (suspected) exposure to other viral communicable diseases: Secondary | ICD-10-CM | POA: Insufficient documentation

## 2019-08-19 LAB — SARS CORONAVIRUS 2 (TAT 6-24 HRS): SARS Coronavirus 2: NEGATIVE

## 2019-08-19 NOTE — Progress Notes (Signed)
Pt denies SOB, chest pain, and being under the care of a cardiologist. Pt stated that PCP is Dr. Doreene Burke, Marlena Clipper, MD. Pt denies having a cardiac cath. Pt denies having labs after 08/05/19. Pt made aware to stop taking Aspirin (unless otherwise advised by surgeon), vitamins, fish oil and herbal medications. Do not take any NSAIDs ie: Ibuprofen, Advil, Naproxen (Aleve), Motrin, BC and Goody Powder. Pt made aware to take 70 units of Humalog 75/25 tonight. Pt made aware to hold Onglyza on DOS. Pt made aware to hold insulin on DOS. Pt made aware to check CBG every 2 hours prior to arrival to hospital on DOS. Pt made aware to treat a CBG < 70 with 4 ounces of apple  juice, wait 15 minutes after intervention to recheck BG, if BG remains < 70, call Short Stay unit to speak with a nurse. Pt verbalized understanding of all pre-op instructions.

## 2019-08-19 NOTE — Progress Notes (Signed)
Nurse made Dr. Kalman Shan, Anesthesiology, aware that pt is diabetic and scheduled for procedure at 1600. MD made aware that pt has been instructed to consume low sugar clear liquids after midnight (ERAS protocol for diabetes ) and to check CBG in the morning and every 2 hours prior to arrival to hospital. No new orders; MD advised that pt should be okay.

## 2019-08-19 NOTE — Progress Notes (Signed)
PA, Anesthesiology, asked to review pt history; see note. 

## 2019-08-19 NOTE — Anesthesia Preprocedure Evaluation (Addendum)
Anesthesia Evaluation  Patient identified by MRN, date of birth, ID band Patient awake    Reviewed: Allergy & Precautions, NPO status , Patient's Chart, lab work & pertinent test results, reviewed documented beta blocker date and time   History of Anesthesia Complications Negative for: history of anesthetic complications  Airway Mallampati: II  TM Distance: >3 FB Neck ROM: Full    Dental no notable dental hx.    Pulmonary asthma , COPD,  COPD inhaler, Current Smoker and Patient abstained from smoking.,  Inhalers: albuterol, symbicort   Pulmonary exam normal breath sounds clear to auscultation       Cardiovascular hypertension, Pt. on medications and Pt. on home beta blockers Normal cardiovascular exam Rhythm:Regular Rate:Normal     Neuro/Psych Seizures -, Well Controlled,  Not on antiepileptics, last seizure 4 mo ago    GI/Hepatic GERD  Medicated,Gastroparesis    Endo/Other  diabetes, Type 2, Insulin DependentMorbid obesity  Renal/GU Renal InsufficiencyRenal disease     Musculoskeletal  (+) Arthritis , Osteoarthritis,    Abdominal   Peds  Hematology  (+) Sickle cell trait and anemia , hct 29   Anesthesia Other Findings   Reproductive/Obstetrics                           Anesthesia Physical Anesthesia Plan  ASA: III  Anesthesia Plan: General   Post-op Pain Management:    Induction: Intravenous  PONV Risk Score and Plan: 2 and Ondansetron, Dexamethasone, Midazolam and Treatment may vary due to age or medical condition  Airway Management Planned: LMA  Additional Equipment: None  Intra-op Plan:   Post-operative Plan: Extubation in OR  Informed Consent: I have reviewed the patients History and Physical, chart, labs and discussed the procedure including the risks, benefits and alternatives for the proposed anesthesia with the patient or authorized representative who has indicated  his/her understanding and acceptance.     Dental advisory given  Plan Discussed with: CRNA  Anesthesia Plan Comments: ( )       Anesthesia Quick Evaluation

## 2019-08-19 NOTE — Progress Notes (Signed)
Anesthesia Chart Review: Jill Shaw   Case: [id number] Date/Time: 08/20/19 1551   Procedures:      DILATATION AND CURETTAGE (N/A )     Minerva Ablation (N/A )   Anesthesia type: Choice   Pre-op diagnosis: AUB   Location: [facility] OR ROOM 02 / [facility]   Surgeon: Emily Filbert, MD      DISCUSSION: Patient is a 43 year old female scheduled for the above procedure.  History includes smoking, HTN, Sickle cell trait, DM2, COPD, asthma, GERD, anemia, neuropathy, seizure-like activity (01/2016, history of hypoglycemic seizure), obesity.  Multiple ED visits. Two more recent visits include: - ED visit 03/08/19 for unresponsiveness in the setting of narcotics, Zanaflex and ETOH. Symptoms improved after Narcan x2 (EMS, ED). She denied intentional OD. - ED visit 02/07/19 for hypoglycemia, altered mental status, hypotension. Required D50 and then D5 drip. Given IVF. Consider accidental insulin overdose. (Normal brain MRI 02/14/19.)  Labs on 10/20/2 showed stable H/H 8.9/29.7. Last A1c 8.4 on 08/05/19. 08/19/19 COVID-19 test in process. She is for urine pregnancy test on the day of surgery. She is a same day work-up, so anesthesia team to evaluate on the day of surgery.    VS: LMP 07/23/2019   Wt Readings from Last 3 Encounters:  08/08/19 128.3 kg  08/05/19 127.5 kg  07/03/19 127.5 kg   BP Readings from Last 3 Encounters:  08/08/19 110/71  08/05/19 (!) 127/46  07/03/19 (!) 128/49   Pulse Readings from Last 3 Encounters:  08/08/19 (!) 103  08/05/19 (!) 110  07/03/19 (!) 106    PROVIDERS: Tresa Garter, MD is listed as PCP. Patient seen by Cammie Sickle, FNP on 08/05/19 for routine follow-up of chronic medical conditions. Advised to schedule neurology follow-up for management of neuropathy that has failed multiple medications. Split night study also ordered. 3 month follow-up planned.    LABS: Lab results as of 08/05/19 include: Lab Results  Component Value Date   WBC 10.0 08/05/2019   HGB  8.9 (L) 08/05/2019   HCT 29.7 (L) 08/05/2019   PLT 303 08/05/2019   GLUCOSE 110 (H) 08/05/2019   ALT 25 06/06/2019   AST 72 (H) 06/06/2019   NA 138 08/05/2019   K 4.2 08/05/2019   CL 101 08/05/2019   CREATININE 0.96 08/05/2019   BUN 12 08/05/2019   CO2 23 08/05/2019   HGBA1C 8.4 (A) 08/05/2019   CBC Latest Ref Rng & Units 08/05/2019 05/22/2019 03/08/2019  WBC 3.4 - 10.8 x10E3/uL 10.0 14.8(H) 11.7(H)  Hemoglobin 11.1 - 15.9 g/dL 8.9(L) 8.9(L) 7.6(L)  Hematocrit 34.0 - 46.6 % 29.7(L) 31.1(L) 27.1(L)  Platelets 150 - 450 x10E3/uL 303 295 322     IMAGES: MRI Brain 02/14/19: IMPRESSION: Normal brain MRI.  1V CXR 02/07/19: IMPRESSION: No acute cardiopulmonary abnormality.   EKG: 03/08/19: Sinus rhythm Low voltage, precordial leads Borderline repolarization abnormality prolonged QT (QT 428 ms/QTc 503 ms) Confirmed by Randal Buba, April ([id number]) on 03/08/2019 5:35:03 AM   CV: Echo 04/16/17: Study Conclusions - Left ventricle: The cavity size was normal. Wall thickness was   increased in a pattern of mild LVH. Systolic function was normal.   The estimated ejection fraction was in the range of 55% to 60%.   Mildly reduced GLPSS at -15%. Wall motion was normal; there were   no regional wall motion abnormalities. The study is not   technically sufficient to allow evaluation of LV diastolic   function. - Aortic valve: Sclerosis without stenosis. There was  no   regurgitation. - Left atrium: The atrium was normal in size. - Inferior vena cava: The vessel was normal in size. The   respirophasic diameter changes were in the normal range (= 50%),   consistent with normal central venous pressure. Impressions: - LVEF 55-60%, mild LVH, normla wall motion, mildly reduced GLPSS   at -15%, normal LA size, normal IVC.   Past Medical History:  Diagnosis Date  . Anemia   . Arthritis    knees, hands  . Asthma   . COPD (chronic obstructive pulmonary disease) ([place])   . Diabetes mellitus  without complication ([place])    type 2  . GERD (gastroesophageal reflux disease)   . Hypertension   . Neuromuscular disorder (HCC)    neuropathy feet  . Seizures ([place]) 09/12/2017   pt states r/t stress and blood sugar - no meds last one 4 months ago, not seen neurologist  . Sickle cell trait ([place])   . Smoker     Past Surgical History:  Procedure Laterality Date  . CESAREAN SECTION     x 1  . EYE SURGERY Bilateral    laser right and cataract removed left eye  . UPPER GI ENDOSCOPY  07/2017    MEDICATIONS: No current facility-administered medications for this encounter.    Marland Kitchen albuterol (PROVENTIL HFA;VENTOLIN HFA) 108 (90 Base) MCG/ACT inhaler  . albuterol (PROVENTIL) (2.5 MG/3ML) 0.083% nebulizer solution  . budesonide-formoterol (SYMBICORT) 160-4.5 MCG/ACT inhaler  . cetirizine (ZYRTEC) 10 MG tablet  . cholecalciferol (VITAMIN D) 1000 units tablet  . cyclobenzaprine (FLEXERIL) 5 MG tablet  . ferrous sulfate 325 (65 FE) MG tablet  . fluticasone (FLONASE) 50 MCG/ACT nasal spray  . furosemide (LASIX) 80 MG tablet  . hydrocortisone 2.5 % cream  . Insulin Lispro Prot & Lispro (HUMALOG MIX 75/25 KWIKPEN) (75-25) 100 UNIT/ML Kwikpen  . losartan (COZAAR) 100 MG tablet  . megestrol (MEGACE) 40 MG tablet  . metoprolol succinate (TOPROL-XL) 25 MG 24 hr tablet  . omeprazole (PRILOSEC) 40 MG capsule  . potassium chloride SA (K-DUR) 20 MEQ tablet  . saxagliptin HCl (ONGLYZA) 2.5 MG TABS tablet  . Blood Glucose Monitoring Suppl (TRUE METRIX METER) w/Device KIT  . glucose blood (TRUE METRIX BLOOD GLUCOSE TEST) test strip  . Insulin Pen Needle (PEN NEEDLES) 30G X 5 MM MISC     Myra Gianotti, PA-C Surgical Short Stay/Anesthesiology [facility] Phone [phone] [facility] Phone [phone] 08/19/2019 12:59 PM

## 2019-08-20 ENCOUNTER — Ambulatory Visit (HOSPITAL_COMMUNITY): Payer: Self-pay | Admitting: Vascular Surgery

## 2019-08-20 ENCOUNTER — Encounter (HOSPITAL_COMMUNITY): Payer: Self-pay

## 2019-08-20 ENCOUNTER — Ambulatory Visit (HOSPITAL_COMMUNITY)
Admission: RE | Admit: 2019-08-20 | Discharge: 2019-08-20 | Disposition: A | Discharge status: Home / Self Care | Payer: Self-pay | Attending: Obstetrics & Gynecology | Admitting: Obstetrics & Gynecology

## 2019-08-20 ENCOUNTER — Encounter (HOSPITAL_COMMUNITY)
Admission: RE | Disposition: A | Discharge status: Home / Self Care | Payer: Self-pay | Source: Home / Self Care | Attending: Obstetrics & Gynecology

## 2019-08-20 ENCOUNTER — Other Ambulatory Visit: Payer: Self-pay

## 2019-08-20 DIAGNOSIS — J449 Chronic obstructive pulmonary disease, unspecified: Secondary | ICD-10-CM | POA: Insufficient documentation

## 2019-08-20 DIAGNOSIS — E1140 Type 2 diabetes mellitus with diabetic neuropathy, unspecified: Secondary | ICD-10-CM | POA: Insufficient documentation

## 2019-08-20 DIAGNOSIS — Z794 Long term (current) use of insulin: Secondary | ICD-10-CM | POA: Insufficient documentation

## 2019-08-20 DIAGNOSIS — N938 Other specified abnormal uterine and vaginal bleeding: Secondary | ICD-10-CM | POA: Insufficient documentation

## 2019-08-20 DIAGNOSIS — D573 Sickle-cell trait: Secondary | ICD-10-CM | POA: Insufficient documentation

## 2019-08-20 DIAGNOSIS — Z6841 Body Mass Index (BMI) 40.0 and over, adult: Secondary | ICD-10-CM | POA: Insufficient documentation

## 2019-08-20 DIAGNOSIS — Z79899 Other long term (current) drug therapy: Secondary | ICD-10-CM | POA: Insufficient documentation

## 2019-08-20 DIAGNOSIS — N939 Abnormal uterine and vaginal bleeding, unspecified: Secondary | ICD-10-CM

## 2019-08-20 DIAGNOSIS — D649 Anemia, unspecified: Secondary | ICD-10-CM | POA: Insufficient documentation

## 2019-08-20 DIAGNOSIS — F17210 Nicotine dependence, cigarettes, uncomplicated: Secondary | ICD-10-CM | POA: Insufficient documentation

## 2019-08-20 DIAGNOSIS — K219 Gastro-esophageal reflux disease without esophagitis: Secondary | ICD-10-CM | POA: Insufficient documentation

## 2019-08-20 DIAGNOSIS — I10 Essential (primary) hypertension: Secondary | ICD-10-CM | POA: Insufficient documentation

## 2019-08-20 DIAGNOSIS — Z7951 Long term (current) use of inhaled steroids: Secondary | ICD-10-CM | POA: Insufficient documentation

## 2019-08-20 HISTORY — PX: DILATION AND CURETTAGE OF UTERUS: SHX78

## 2019-08-20 HISTORY — DX: Other specified abnormal uterine and vaginal bleeding: N93.8

## 2019-08-20 HISTORY — DX: Presence of spectacles and contact lenses: Z97.3

## 2019-08-20 HISTORY — PX: ENDOMETRIAL ABLATION: SHX621

## 2019-08-20 LAB — GLUCOSE, CAPILLARY
Glucose-Capillary: 153 mg/dL — ABNORMAL HIGH (ref 70–99)
Glucose-Capillary: 82 mg/dL (ref 70–99)

## 2019-08-20 SURGERY — DILATION AND CURETTAGE
Anesthesia: General | Site: Vagina

## 2019-08-20 MED ORDER — PROPOFOL 10 MG/ML IV BOLUS
INTRAVENOUS | Status: DC | PRN
  Administered 2019-08-20 (×5): 50 mg via INTRAVENOUS
  Administered 2019-08-20: 150 mg via INTRAVENOUS

## 2019-08-20 MED ORDER — SUCCINYLCHOLINE CHLORIDE 20 MG/ML IJ SOLN
INTRAMUSCULAR | Status: DC | PRN
  Administered 2019-08-20: 140 mg via INTRAVENOUS

## 2019-08-20 MED ORDER — FENTANYL CITRATE (PF) 100 MCG/2ML IJ SOLN
25.0000 ug | INTRAMUSCULAR | Status: DC | PRN
  Administered 2019-08-20: 50 ug via INTRAVENOUS

## 2019-08-20 MED ORDER — DIPHENHYDRAMINE HCL 50 MG/ML IJ SOLN
INTRAMUSCULAR | Status: DC | PRN
  Administered 2019-08-20: 6.25 mg via INTRAVENOUS

## 2019-08-20 MED ORDER — BUPIVACAINE HCL (PF) 0.5 % IJ SOLN
INTRAMUSCULAR | Status: AC
  Filled 2019-08-20: qty 30

## 2019-08-20 MED ORDER — DEXAMETHASONE SODIUM PHOSPHATE 10 MG/ML IJ SOLN
INTRAMUSCULAR | Status: DC | PRN
  Administered 2019-08-20: 5 mg via INTRAVENOUS

## 2019-08-20 MED ORDER — DEXAMETHASONE SODIUM PHOSPHATE 10 MG/ML IJ SOLN
INTRAMUSCULAR | Status: AC
  Filled 2019-08-20: qty 1

## 2019-08-20 MED ORDER — OXYCODONE HCL 5 MG/5ML PO SOLN: 5.0000 mg | Freq: Once | ORAL | Status: AC | PRN

## 2019-08-20 MED ORDER — OXYCODONE HCL 5 MG PO TABS
5.0000 mg | ORAL_TABLET | Freq: Once | ORAL | Status: AC | PRN
  Administered 2019-08-20: 5 mg via ORAL

## 2019-08-20 MED ORDER — OXYCODONE HCL 5 MG PO TABS
ORAL_TABLET | ORAL | Status: AC
  Filled 2019-08-20: qty 1

## 2019-08-20 MED ORDER — ACETAMINOPHEN ER 650 MG PO TBCR: 650.0000 mg | EXTENDED_RELEASE_TABLET | Freq: Three times a day (TID) | ORAL | 1 refills | Status: DC | PRN

## 2019-08-20 MED ORDER — PROPOFOL 10 MG/ML IV BOLUS
INTRAVENOUS | Status: AC
  Filled 2019-08-20: qty 20

## 2019-08-20 MED ORDER — DIPHENHYDRAMINE HCL 50 MG/ML IJ SOLN
INTRAMUSCULAR | Status: AC
  Filled 2019-08-20: qty 1

## 2019-08-20 MED ORDER — PROMETHAZINE HCL 25 MG/ML IJ SOLN: 6.2500 mg | INTRAMUSCULAR | Status: DC | PRN

## 2019-08-20 MED ORDER — LACTATED RINGERS IV SOLN
INTRAVENOUS | Status: DC
  Administered 2019-08-20 (×2): via INTRAVENOUS

## 2019-08-20 MED ORDER — ACETAMINOPHEN 10 MG/ML IV SOLN: 1000.0000 mg | Freq: Once | INTRAVENOUS | Status: DC | PRN

## 2019-08-20 MED ORDER — SUCCINYLCHOLINE CHLORIDE 200 MG/10ML IV SOSY
PREFILLED_SYRINGE | INTRAVENOUS | Status: AC
  Filled 2019-08-20: qty 10

## 2019-08-20 MED ORDER — LIDOCAINE 2% (20 MG/ML) 5 ML SYRINGE
INTRAMUSCULAR | Status: DC | PRN
  Administered 2019-08-20: 100 mg via INTRAVENOUS

## 2019-08-20 MED ORDER — FENTANYL CITRATE (PF) 100 MCG/2ML IJ SOLN
INTRAMUSCULAR | Status: AC
  Filled 2019-08-20: qty 2

## 2019-08-20 MED ORDER — MIDAZOLAM HCL 2 MG/2ML IJ SOLN
INTRAMUSCULAR | Status: AC
  Filled 2019-08-20: qty 2

## 2019-08-20 MED ORDER — ONDANSETRON HCL 4 MG/2ML IJ SOLN
INTRAMUSCULAR | Status: AC
  Filled 2019-08-20: qty 2

## 2019-08-20 MED ORDER — ONDANSETRON HCL 4 MG/2ML IJ SOLN
INTRAMUSCULAR | Status: DC | PRN
  Administered 2019-08-20: 4 mg via INTRAVENOUS

## 2019-08-20 MED ORDER — LIDOCAINE 2% (20 MG/ML) 5 ML SYRINGE
INTRAMUSCULAR | Status: AC
  Filled 2019-08-20: qty 5

## 2019-08-20 MED ORDER — FENTANYL CITRATE (PF) 250 MCG/5ML IJ SOLN
INTRAMUSCULAR | Status: AC
  Filled 2019-08-20: qty 5

## 2019-08-20 MED ORDER — MIDAZOLAM HCL 5 MG/5ML IJ SOLN
INTRAMUSCULAR | Status: DC | PRN
  Administered 2019-08-20: 2 mg via INTRAVENOUS

## 2019-08-20 MED ORDER — IBUPROFEN 600 MG PO TABS: 600.0000 mg | ORAL_TABLET | Freq: Four times a day (QID) | ORAL | 1 refills | Status: DC | PRN

## 2019-08-20 MED ORDER — FENTANYL CITRATE (PF) 100 MCG/2ML IJ SOLN
INTRAMUSCULAR | Status: DC | PRN
  Administered 2019-08-20: 50 ug via INTRAVENOUS
  Administered 2019-08-20: 100 ug via INTRAVENOUS

## 2019-08-20 MED ORDER — BUPIVACAINE HCL 0.5 % IJ SOLN
INTRAMUSCULAR | Status: DC | PRN
  Administered 2019-08-20: 20 mL

## 2019-08-20 SURGICAL SUPPLY — 15 items
CONT SPEC 4OZ CLIKSEAL STRL BL (MISCELLANEOUS) ×2 IMPLANT
DILATOR CANAL MILEX (MISCELLANEOUS) IMPLANT
GLOVE BIO SURGEON STRL SZ 6.5 (GLOVE) ×2 IMPLANT
GLOVE BIOGEL PI IND STRL 7.0 (GLOVE) ×1 IMPLANT
GLOVE BIOGEL PI INDICATOR 7.0 (GLOVE) ×1
GOWN STRL REUS W/ TWL LRG LVL3 (GOWN DISPOSABLE) ×2 IMPLANT
GOWN STRL REUS W/TWL LRG LVL3 (GOWN DISPOSABLE) ×2
HANDPIECE ABLA MINERVA ENDO (MISCELLANEOUS) ×1 IMPLANT
KIT TURNOVER KIT B (KITS) ×2 IMPLANT
NDL SPNL 18GX3.5 QUINCKE PK (NEEDLE) ×1 IMPLANT
NEEDLE SPNL 18GX3.5 QUINCKE PK (NEEDLE) ×2 IMPLANT
PACK VAGINAL MINOR WOMEN LF (CUSTOM PROCEDURE TRAY) ×2 IMPLANT
PAD OB MATERNITY 4.3X12.25 (PERSONAL CARE ITEMS) ×2 IMPLANT
TOWEL GREEN STERILE FF (TOWEL DISPOSABLE) ×4 IMPLANT
UNDERPAD 30X36 HEAVY ABSORB (UNDERPADS AND DIAPERS) ×2 IMPLANT

## 2019-08-20 NOTE — Anesthesia Postprocedure Evaluation (Signed)
Anesthesia Post Note  Patient: Jill Shaw  Procedure(s) Performed: DILATATION AND CURETTAGE (N/A Vagina ) Minerva Ablation (N/A Vagina )     Patient location during evaluation: PACU Anesthesia Type: General Level of consciousness: awake and alert and oriented Pain management: pain level controlled Vital Signs Assessment: post-procedure vital signs reviewed and stable Respiratory status: spontaneous breathing, nonlabored ventilation and respiratory function stable Cardiovascular status: blood pressure returned to baseline Postop Assessment: no apparent nausea or vomiting Anesthetic complications: no    Last Vitals:  Vitals:   08/20/19 1720 08/20/19 1735  BP: 135/65 (!) 127/57  Pulse: 94 90  Resp: 20 16  Temp: 36.4 C   SpO2: 97% 100%    Last Pain:  Vitals:   08/20/19 1720  TempSrc:   PainSc: [place] Asmara Backs

## 2019-08-20 NOTE — Discharge Instructions (Signed)
Dilation and Curettage or Vacuum Curettage, Care After °This sheet gives you information about how to care for yourself after your procedure. Your health care provider may also give you more specific instructions. If you have problems or questions, contact your health care provider. °What can I expect after the procedure? °After your procedure, it is common to have: °· Mild pain or cramping. °· Some vaginal bleeding or spotting. °These may last for up to 2 weeks after your procedure. °Follow these instructions at home: °Activity ° °· Do not drive or use heavy machinery while taking prescription pain medicine. °· Avoid driving for the first 24 hours after your procedure. °· Take frequent, short walks, followed by rest periods, throughout the day. Ask your health care provider what activities are safe for you. After 1-2 days, you may be able to return to your normal activities. °· Do not lift anything heavier than 10 lb (4.5 kg) until your health care provider approves. °· For at least 2 weeks, or as long as told by your health care provider, do not: °? Douche. °? Use tampons. °? Have sexual intercourse. °General instructions ° °· Take over-the-counter and prescription medicines only as told by your health care provider. This is especially important if you take blood thinning medicine. °· Do not take baths, swim, or use a hot tub until your health care provider approves. Take showers instead of baths. °· Wear compression stockings as told by your health care provider. These stockings help to prevent blood clots and reduce swelling in your legs. °· It is your responsibility to get the results of your procedure. Ask your health care provider, or the department performing the procedure, when your results will be ready. °· Keep all follow-up visits as told by your health care provider. This is important. °Contact a health care provider if: °· You have severe cramps that get worse or that do not get better with  medicine. °· You have severe abdominal pain. °· You cannot drink fluids without vomiting. °· You develop pain in a different area of your pelvis. °· You have bad-smelling vaginal discharge. °· You have a rash. °Get help right away if: °· You have vaginal bleeding that soaks more than one sanitary pad in 1 hour, for 2 hours in a row. °· You pass large blood clots from your vagina. °· You have a fever that is above 100.4°F (38.0°C). °· Your abdomen feels very tender or hard. °· You have chest pain. °· You have shortness of breath. °· You cough up blood. °· You feel dizzy or light-headed. °· You faint. °· You have pain in your neck or shoulder area. °This information is not intended to replace advice given to you by your health care provider. Make sure you discuss any questions you have with your health care provider. °Document Released: 09/29/2000 Document Revised: 09/14/2017 Document Reviewed: 05/04/2016 °Elsevier Patient Education © 2020 [employer] ° °

## 2019-08-20 NOTE — Anesthesia Procedure Notes (Signed)
Procedure Name: Intubation Date/Time: 08/20/2019 4:40 PM Performed by: Trinna Post., CRNA Pre-anesthesia Checklist: Patient identified, Emergency Drugs available, Suction available, Patient being monitored and Timeout performed Patient Re-evaluated:Patient Re-evaluated prior to induction Oxygen Delivery Method: Circle system utilized Preoxygenation: Pre-oxygenation with 100% oxygen Induction Type: IV induction and Rapid sequence Ventilation: Two handed mask ventilation required Laryngoscope Size: Glidescope and 3 Grade View: Grade I Tube type: Oral Tube size: 7.0 mm Number of attempts: 1 Airway Equipment and Method: Rigid stylet Placement Confirmation: ETT inserted through vocal cords under direct vision,  positive ETCO2 and breath sounds checked- equal and bilateral Secured at: 23 cm Tube secured with: Tape Dental Injury: Teeth and Oropharynx as per pre-operative assessment

## 2019-08-20 NOTE — Op Note (Signed)
08/20/2019  5:05 PM  PATIENT:  Jill Shaw  43 y.o. female  PRE-OPERATIVE DIAGNOSIS:  ABNORMAL Uterine Bleeding(AUB), morbid obesity POST-OPERATIVE DIAGNOSIS: same PROCEDURE:  Procedure(s): DILATATION AND CURETTAGE (N/A) Minerva Ablation (N/A)  SURGEON:  Surgeon(s) and Role:    * Pranika Finks, Wilhemina Cash, MD - Primary   ANESTHESIA:   local and general  EBL: minimal  BLOOD ADMINISTERED:none  DRAINS: none   LOCAL MEDICATIONS USED:  MARCAINE     SPECIMEN:  Source of Specimen:  endometrial curettings  DISPOSITION OF SPECIMEN:  PATHOLOGY  COUNTS:  YES  TOURNIQUET:  * No tourniquets in log *  DICTATION: .Dragon Dictation  PLAN OF CARE: Discharge to home after PACU  PATIENT DISPOSITION:  PACU - hemodynamically stable.   Delay start of Pharmacological VTE agent (>24hrs) due to surgical blood loss or risk of bleeding: not applicable  The risks, benefits, and alternatives of surgery were explained, understood, and accepted. All questions were answered. Consents were signed. In the operating room general anesthesia was applied without complication, and she was placed in the dorsal lithotomy position. Her vagina was prepped and draped in the usual sterile fashion.  A bimanual exam revealed an upper limit of normal size and shape, anteverted mobile uterus. Her adnexa were nonenlarged. Her pubic arch is fair if she needs a vaginal hysterectomy in the future. A speculum was placed and a single-tooth tenaculum was used to grasp the anterior lip of her cervix. She is currently menstruating.  A total of 30 mL of 0.5% Marcaine was used to perform a paracervical block. Her uterus sounded to 12 cm and her cervical length was 5 cm. The device was set at 6.5 cm for cavity length. Her cervix was carefully and slowly dilated to accommodate a small curette. A curettage was done in all quadrants and the fundus of the uterus. A small amount of tissue was obtained.  I then proceeded with the Minerva procedure.  The procedure proceeded as expected. There was no bleeding noted at the end of the case. She was taken to the recovery room after being extubated. She tolerated the procedure well.

## 2019-08-20 NOTE — Transfer of Care (Signed)
Immediate Anesthesia Transfer of Care Note  Patient: PRARTHANA PARLIN  Procedure(s) Performed: DILATATION AND CURETTAGE (N/A Vagina ) Minerva Ablation (N/A Vagina )  Patient Location: PACU  Anesthesia Type:General  Level of Consciousness: awake, alert  and oriented  Airway & Oxygen Therapy: Patient Spontanous Breathing and Patient connected to nasal cannula oxygen  Post-op Assessment: Report given to RN and Post -op Vital signs reviewed and stable  Post vital signs: Reviewed and stable  Last Vitals:  Vitals Value Taken Time  BP 135/65 08/20/19 1719  Temp    Pulse 95 08/20/19 1722  Resp 27 08/20/19 1722  SpO2 97 % 08/20/19 1722  Vitals shown include unvalidated device data.  Last Pain:  Vitals:   08/20/19 1347  TempSrc: Oral  PainSc: 0-No pain      Patients Stated Pain Goal: 4 (27/03/50 0938)  Complications: No apparent anesthesia complications

## 2019-08-20 NOTE — H&P (Signed)
Jill Shaw is an 43 yo single P3 here today with continued heavy periods, regular monthly but lasting 10 days per month. Her hbg this month was 8.9. She is taking iron pills daily. She has been scheduled for a d&c and Minerva ablation twice and has cancelled twice. She is here today and wants to have the procedures today.   She declined IV iron infusion.  She has had a BTL. I did an EMBX in 018 and it was benign.  Patient's last menstrual period was 08/20/2019 (exact date).    Past Medical History:  Diagnosis Date  . Anemia   . Arthritis    knees, hands  . Asthma   . COPD (chronic obstructive pulmonary disease) ([place])   . Diabetes mellitus without complication ([place])    type 2  . Dysfunctional uterine bleeding   . GERD (gastroesophageal reflux disease)   . Hypertension   . Neuromuscular disorder (HCC)    neuropathy feet  . Seizures ([place]) 09/12/2017   pt states r/t stress and blood sugar - no meds last one 4 months ago, not seen neurologist  . Sickle cell trait ([place])   . Smoker   . Wears glasses     Past Surgical History:  Procedure Laterality Date  . CESAREAN SECTION     x 1  . EYE SURGERY Bilateral    laser right and cataract removed left eye  . TUBAL LIGATION    . UPPER GI ENDOSCOPY  07/2017    Family History  Problem Relation Age of Onset  . Diabetes Mother   . Hypertension Mother     Social History:  reports that she has been smoking cigarettes. She has a 7.50 pack-year smoking history. She has never used smokeless tobacco. She reports that she does not drink alcohol or use drugs.  Allergies:  Allergies  Allergen Reactions  . Ketoprofen Nausea And Vomiting  . Aspirin Nausea Only  . Gabapentin Nausea And Vomiting    upset stomach  . Ibuprofen Nausea And Vomiting  . Liraglutide Nausea And Vomiting  . Naproxen Nausea And Vomiting  . Omeprazole-Sodium Bicarbonate Nausea And Vomiting  . Sulfa Antibiotics Nausea And Vomiting  . Tramadol Nausea And Vomiting     stomach upset    Medications Prior to Admission  Medication Sig Dispense Refill Last Dose  . albuterol (PROVENTIL HFA;VENTOLIN HFA) 108 (90 Base) MCG/ACT inhaler Inhale 2 puffs into the lungs every 6 (six) hours as needed for wheezing or shortness of breath. 1 Inhaler 0 08/20/2019 at 1130  . albuterol (PROVENTIL) (2.5 MG/3ML) 0.083% nebulizer solution Take 3 mLs (2.5 mg total) by nebulization every 6 (six) hours as needed for wheezing or shortness of breath. 150 mL 1 08/19/2019 at Unknown time  . Blood Glucose Monitoring Suppl (TRUE METRIX METER) w/Device KIT 1 each by Does not apply route 4 (four) times daily -  before meals and at bedtime. 1 kit 0 08/20/2019 at 1200  . budesonide-formoterol (SYMBICORT) 160-4.5 MCG/ACT inhaler Inhale 2 puffs into the lungs 2 (two) times daily. 1 Inhaler 3 08/19/2019 at Unknown time  . cetirizine (ZYRTEC) 10 MG tablet Take 1 tablet (10 mg total) by mouth daily. 30 tablet 11 08/19/2019 at Unknown time  . cholecalciferol (VITAMIN D) 1000 units tablet Take 1,000 Units by mouth daily.   08/19/2019 at Unknown time  . cyclobenzaprine (FLEXERIL) 5 MG tablet Take 1 tablet (5 mg total) by mouth 3 (three) times daily as needed for muscle spasms. 30 tablet 1  08/18/2019 at Unknown time  . ferrous sulfate 325 (65 FE) MG tablet Take 1 tablet (325 mg total) by mouth 3 (three) times daily with meals. 90 tablet 0 08/19/2019 at Unknown time  . fluticasone (FLONASE) 50 MCG/ACT nasal spray Place 2 sprays into both nostrils daily. 16 g 6 08/19/2019 at Unknown time  . furosemide (LASIX) 80 MG tablet Take 1 tablet (80 mg total) by mouth daily. 90 tablet 1 08/19/2019 at Unknown time  . glucose blood (TRUE METRIX BLOOD GLUCOSE TEST) test strip Use as instructed 100 each 12 08/20/2019 at Unknown time  . hydrocortisone 2.5 % cream Apply topically 2 (two) times daily. 30 g 0 08/18/2019  . Insulin Lispro Prot & Lispro (HUMALOG MIX 75/25 KWIKPEN) (75-25) 100 UNIT/ML Kwikpen INJECT 100 UNITS EVERY 12  HOURS (Patient taking differently: Inject 100 Units into the skin every 12 (twelve) hours. ) 15 mL 11 08/19/2019 at 2100  . Insulin Pen Needle (PEN NEEDLES) 30G X 5 MM MISC 1 Units by Does not apply route 2 (two) times a day. 100 each 1 08/19/2019 at Unknown time  . losartan (COZAAR) 100 MG tablet Take 1 tablet (100 mg total) by mouth daily. 90 tablet 3 08/19/2019 at Unknown time  . megestrol (MEGACE) 40 MG tablet Take 1 tablet (40 mg total) by mouth 2 (two) times daily. 60 tablet 5 08/19/2019 at Unknown time  . metoprolol succinate (TOPROL-XL) 25 MG 24 hr tablet Take 1 tablet (25 mg total) by mouth daily. 30 tablet 5 08/20/2019 at 0900  . omeprazole (PRILOSEC) 40 MG capsule Take 1 capsule (40 mg total) by mouth daily. 30 capsule 3 08/19/2019 at Unknown time  . potassium chloride SA (K-DUR) 20 MEQ tablet Take 1 tablet (20 mEq total) by mouth daily. 30 tablet 3 08/17/2019  . saxagliptin HCl (ONGLYZA) 2.5 MG TABS tablet Take 1 tablet (2.5 mg total) by mouth daily. 90 tablet 3 08/19/2019 at Unknown time    ROS  Blood pressure (!) 127/58, pulse 93, temperature 98 F (36.7 C), temperature source Oral, resp. rate 18, height _0  (1.626 m), weight 128.3 kg, last menstrual period 08/20/2019, SpO2 100 %. Physical Exam Breathing, conversing, and ambulating normally Well nourished, well hydrated Black female, no apparent distress Heart- rrr Lungs- CTAB Abd- benign, obese  Results for orders placed or performed during the hospital encounter of 08/20/19 (from the past 24 hour(s))  Glucose, capillary     Status: Abnormal   Collection Time: 08/20/19  1:44 PM  Result Value Ref Range   Glucose-Capillary 153 (H) 70 - 99 mg/dL    No results found.  Assessment/Plan: DUB, anemia- plan for Minerva ablation and d&c  She understands the risks of surgery, including, but not to infection, bleeding, DVTs, damage to bowel, bladder, ureters. She wishes to proceed.     Emily Filbert 08/20/2019, 3:55 PM

## 2019-08-20 NOTE — Progress Notes (Signed)
Dr. Doroteo Glassman okay with CBC & BMP drawn 10/20/220. No need to redraw labs today.

## 2019-08-21 ENCOUNTER — Telehealth: Payer: Self-pay | Admitting: Emergency Medicine

## 2019-08-21 ENCOUNTER — Telehealth: Payer: Self-pay | Admitting: Obstetrics & Gynecology

## 2019-08-21 ENCOUNTER — Encounter (HOSPITAL_COMMUNITY): Payer: Self-pay | Admitting: Obstetrics & Gynecology

## 2019-08-21 DIAGNOSIS — N939 Abnormal uterine and vaginal bleeding, unspecified: Secondary | ICD-10-CM | POA: Diagnosis not present

## 2019-08-21 MED FILL — CYCLOBENZAPRINE 5 MG TABLET: 5 | 10 days supply | Qty: 30 | Fill #1

## 2019-08-21 NOTE — Telephone Encounter (Signed)
Dr. Doreene Burke,  Can you please have someone from your office to call the patient with her EMG per Dr. Posey Pronto.  Thank you

## 2019-08-21 NOTE — Telephone Encounter (Signed)
Pt called and left a message on the nurse voicemail line stating that she just had surgery and went to the pharmacy to pick her medications and one was for ibuprofen and she can't take ibuprofen. Pt stated she needs a new prescription.

## 2019-08-21 NOTE — Telephone Encounter (Signed)
Patient called about her medication. She said she was not able to take the medicine given to her, and is requesting a call back.

## 2019-08-22 ENCOUNTER — Telehealth: Payer: Self-pay | Admitting: Family Medicine

## 2019-08-22 ENCOUNTER — Encounter (HOSPITAL_COMMUNITY): Payer: Self-pay | Admitting: Obstetrics & Gynecology

## 2019-08-22 ENCOUNTER — Ambulatory Visit (INDEPENDENT_AMBULATORY_CARE_PROVIDER_SITE_OTHER): Payer: Self-pay | Admitting: Family Medicine

## 2019-08-22 ENCOUNTER — Other Ambulatory Visit: Payer: Self-pay

## 2019-08-22 DIAGNOSIS — G8929 Other chronic pain: Secondary | ICD-10-CM

## 2019-08-22 DIAGNOSIS — M25561 Pain in right knee: Secondary | ICD-10-CM

## 2019-08-22 LAB — SURGICAL PATHOLOGY

## 2019-08-22 MED ORDER — GLUCOSAMINE SULFATE 1000 MG PO CAPS: 1.0000 | ORAL_CAPSULE | Freq: Two times a day (BID) | ORAL | 3 refills | Status: DC

## 2019-08-22 MED ORDER — DICLOFENAC SODIUM 1 % TD GEL: 4.0000 g | Freq: Four times a day (QID) | TRANSDERMAL | 6 refills | Status: DC | PRN

## 2019-08-22 MED ORDER — VITAMIN D-3 125 MCG (5000 UT) PO TABS: 1.0000 | ORAL_TABLET | Freq: Every day | ORAL | 3 refills | Status: DC

## 2019-08-22 MED ORDER — TURMERIC 500 MG PO CAPS: 500.0000 mg | ORAL_CAPSULE | Freq: Two times a day (BID) | ORAL | 3 refills | Status: DC

## 2019-08-22 NOTE — Progress Notes (Signed)
Office Visit Note   Patient: Jill Shaw           Date of Birth: 22-Mar-1976           MRN: [id number] Visit Date: 08/22/2019 Requested by: Massie Maroon, FNP [address] [place],  [place] [address] PCP: Quentin Angst, MD  Subjective: Chief Complaint  Patient presents with  . Right Knee - Pain    Hurts worse with starting to stand from a seated position. Pain with much walking. Feels like knee will give way.    HPI: She is here with right knee pain.  Longstanding problems with pain, getting worse in the past years.  Pain when transitioning from sitting to standing, feels like the knee is going to give way.  She recently had an MRI scan which I reviewed, it basically showed some very mild patellofemoral articular cartilage irregularity but no other abnormality.  She points to the front of her knee as the location of most of her pain but she states that she gets some pain posteriorly as well.  Elsewhere she has been given a cortisone injection which she says did not help.  She is also taking hydrocodone in the past but she feels like she needs something stronger.               ROS: No fevers or chills.  All other systems were reviewed and are negative.  Objective: Vital Signs: LMP 08/20/2019 (Exact Date)   Physical Exam:  General:  Alert and oriented, in no acute distress. Pulm:  Breathing unlabored. Psy:  Normal mood, congruent affect. Skin: No rash or erythema. Right knee: Trace effusion with no warmth.  Full extension, flexion of about 125 degrees.  No significant patellofemoral crepitus but she does have pain with patella compression and she is tender around the patellofemoral joint.  No tenderness over the quadriceps or patellar tendons or the medial and lateral joint lines.  No ligamentous laxity, no palpable click with McMurray's.  Imaging: None today.  Recent MRI scan was reviewed on computer.  Assessment & Plan: 1.  Chronic right knee pain possibly  due to mild patellofemoral DJD.  I think morbid obesity is significantly contributing to her pain. -We discussed the brace, physical therapy, gel injections.  She really is not interested in these things and would prefer to be referred to a pain clinic.  I will arrange this for her. -Recommended glucosamine, increased vitamin D3, minimizing squatting and kneeling. -Emphasized the importance of weight loss for long-term joint health. -Follow-up as needed.    Procedures: No procedures performed  No notes on file     PMFS History: Patient Active Problem List   Diagnosis Date Noted  . Hypoglycemia 02/07/2019  . Hypotension 02/07/2019  . Lactic acidosis 02/07/2019  . Right ankle pain 02/07/2019  . Chronic obstructive pulmonary disease (HCC) 02/05/2019  . Seizure (HCC) 07/31/2018  . Vitamin D deficiency 05/31/2017  . Gastroesophageal reflux disease 05/24/2017  . Menorrhagia with irregular cycle 05/17/2017  . Anemia of chronic disease 04/06/2017  . Knee pain, chronic 03/27/2016  . Type 2 diabetes mellitus without complication, with long-term current use of insulin (HCC) 03/27/2016  . Essential hypertension 03/27/2016  . Morbid obesity (HCC) 03/27/2016  . Irritable bowel syndrome with constipation 03/27/2016  . Tobacco dependence 03/27/2016  . CKD (chronic kidney disease) stage 2, GFR 60-89 ml/min 03/25/2016  . Arthropathy, lower leg 05/04/2013  . CTS (carpal tunnel syndrome) 05/04/2013  .  Peripheral edema 05/04/2013  . Chronic pain 05/13/2012  . Diabetic gastroparesis ([place]) 11/06/2006  . Hot flashes 11/06/2006   Past Medical History:  Diagnosis Date  . Anemia   . Arthritis    knees, hands  . Asthma   . COPD (chronic obstructive pulmonary disease) ([place])   . Diabetes mellitus without complication ([place])    type 2  . Dysfunctional uterine bleeding   . GERD (gastroesophageal reflux disease)   . Hypertension   . Neuromuscular disorder (HCC)    neuropathy feet  . Seizures  ([place]) 09/12/2017   pt states r/t stress and blood sugar - no meds last one 4 months ago, not seen neurologist  . Sickle cell trait ([place])   . Smoker   . Wears glasses     Family History  Problem Relation Age of Onset  . Diabetes Mother   . Hypertension Mother     Past Surgical History:  Procedure Laterality Date  . CESAREAN SECTION     x 1  . DILATION AND CURETTAGE OF UTERUS N/A 08/20/2019   Procedure: DILATATION AND CURETTAGE;  Surgeon: Emily Filbert, MD;  Location: [facility];  Service: Gynecology;  Laterality: N/A;  . ENDOMETRIAL ABLATION N/A 08/20/2019   Procedure: Minerva Ablation;  Surgeon: Emily Filbert, MD;  Location: [facility];  Service: Gynecology;  Laterality: N/A;  . EYE SURGERY Bilateral    laser right and cataract removed left eye  . TUBAL LIGATION    . UPPER GI ENDOSCOPY  07/2017   Social History   Occupational History  . Not on file  Tobacco Use  . Smoking status: Current Every Day Smoker    Packs/day: 0.50    Years: 15.00    Pack years: 7.50    Types: Cigarettes  . Smokeless tobacco: Never Used  Substance and Sexual Activity  . Alcohol use: No  . Drug use: No  . Sexual activity: Not Currently    Birth control/protection: None

## 2019-08-22 NOTE — Telephone Encounter (Signed)
Jill Shaw, a 43 year old female with a history of uncontrolled hypertension, uncontrolled type 2 diabetes mellitus, fibromyalgia, GERD, carpal tunnel syndrome, vitamin D deficiency, arthropathy, tobacco dependence, and chronic pain syndrome underwent an EMG on 07/22/2019.  I discussed patient's results with her during her previous primary care visit on 08/05/2019 at length.  Patient was not agreeable with recommended care plan for polyneuropathy and right carpal tunnel syndrome.  Therefore, patient was advised to schedule follow-up with neurology. I made 3 attempts on 08/11/2019 to discuss results with this patient  without success. Notified patient again on today to reiterate results of EMG and recommend an alternative treatment plan at the request of neurology.  No answer.  Left message.   Donia Pounds  APRN, MSN, FNP-C [facility] [address] [place], [place] [address] [phone]

## 2019-08-25 ENCOUNTER — Other Ambulatory Visit (HOSPITAL_COMMUNITY): Payer: Self-pay

## 2019-08-28 ENCOUNTER — Other Ambulatory Visit (HOSPITAL_COMMUNITY): Payer: Self-pay

## 2019-08-28 NOTE — Telephone Encounter (Signed)
I called and left a message that I am returning her call from several days ago and I understand her issue may already be addressed; but if you still need assistance- please call us back. Linda,RN

## 2019-08-29 ENCOUNTER — Ambulatory Visit (INDEPENDENT_AMBULATORY_CARE_PROVIDER_SITE_OTHER): Payer: Self-pay | Admitting: Family Medicine

## 2019-08-29 ENCOUNTER — Other Ambulatory Visit: Payer: Self-pay

## 2019-08-29 ENCOUNTER — Encounter: Payer: Self-pay | Admitting: Family Medicine

## 2019-08-29 VITALS — Ht 64.0 in | Wt 282.0 lb

## 2019-08-29 DIAGNOSIS — M25561 Pain in right knee: Secondary | ICD-10-CM

## 2019-08-29 DIAGNOSIS — G8929 Other chronic pain: Secondary | ICD-10-CM

## 2019-08-29 NOTE — Progress Notes (Signed)
Office Visit Note   Patient: Jill Shaw           Date of Birth: 09-May-1976           MRN: [id number] Visit Date: 08/29/2019 Requested by: Quentin Angst, MD [address] [place],  [place] [address] PCP: Quentin Angst, MD  Subjective: Chief Complaint  Patient presents with  . Right Knee - Pain    Could not afford the medication given 08/22/19. Has fallen 3 times due to right knee. Requests an Rx for a walker and a cane (has spoken with [facility] about this).  . Left Knee - Pain    HPI: She is here with persistent right knee pain.  Glucosamine apparently was going to cost more than $200 sent in as a prescription.  I told her today that she can get the same thing at [facility] for less than $12 for a 85-month supply.  She would also like a prescription for a cane and a walker.  We discussed her weight and her current BMI of 48.  She needs to lose weight to at least 232 pounds or less to be below a BMI of 40.  She drinks sweet tea and koolaid, but otherwise doesn't consume much bread or processed carbs.              ROS: No fevers/chills.  All other systems were reviewed and are negative.  Objective: Vital Signs: LMP 08/20/2019 (Exact Date)   Physical Exam:  General:  Alert and oriented, in no acute distress. Pulm:  Breathing unlabored. Psy:  Normal mood, congruent affect.  No other exam done.  Imaging: None today  Assessment & Plan: 1.  Chronic right knee pain with very mild patellofemoral degenerative change on MRI scan. -Prescriptions given for cane and walker. -She will keep working on losing weight. -Pain clinic referral already made last visit.     Procedures: No procedures performed  No notes on file     PMFS History: Patient Active Problem List   Diagnosis Date Noted  . Hypoglycemia 02/07/2019  . Hypotension 02/07/2019  . Lactic acidosis 02/07/2019  . Right ankle pain 02/07/2019  . Chronic obstructive pulmonary disease (HCC) 02/05/2019  .  Seizure (HCC) 07/31/2018  . Vitamin D deficiency 05/31/2017  . Gastroesophageal reflux disease 05/24/2017  . Menorrhagia with irregular cycle 05/17/2017  . Anemia of chronic disease 04/06/2017  . Knee pain, chronic 03/27/2016  . Type 2 diabetes mellitus without complication, with long-term current use of insulin (HCC) 03/27/2016  . Essential hypertension 03/27/2016  . Morbid obesity (HCC) 03/27/2016  . Irritable bowel syndrome with constipation 03/27/2016  . Tobacco dependence 03/27/2016  . CKD (chronic kidney disease) stage 2, GFR 60-89 ml/min 03/25/2016  . Arthropathy, lower leg 05/04/2013  . CTS (carpal tunnel syndrome) 05/04/2013  . Peripheral edema 05/04/2013  . Chronic pain 05/13/2012  . Diabetic gastroparesis (HCC) 11/06/2006  . Hot flashes 11/06/2006   Past Medical History:  Diagnosis Date  . Anemia   . Arthritis    knees, hands  . Asthma   . COPD (chronic obstructive pulmonary disease) (HCC)   . Diabetes mellitus without complication (HCC)    type 2  . Dysfunctional uterine bleeding   . GERD (gastroesophageal reflux disease)   . Hypertension   . Neuromuscular disorder (HCC)    neuropathy feet  . Seizures (HCC) 09/12/2017   pt states r/t stress and blood sugar - no meds last one 4 months ago, not  seen neurologist  . Sickle cell trait ([place])   . Smoker   . Wears glasses     Family History  Problem Relation Age of Onset  . Diabetes Mother   . Hypertension Mother     Past Surgical History:  Procedure Laterality Date  . CESAREAN SECTION     x 1  . DILATION AND CURETTAGE OF UTERUS N/A 08/20/2019   Procedure: DILATATION AND CURETTAGE;  Surgeon: Emily Filbert, MD;  Location: [facility];  Service: Gynecology;  Laterality: N/A;  . ENDOMETRIAL ABLATION N/A 08/20/2019   Procedure: Minerva Ablation;  Surgeon: Emily Filbert, MD;  Location: [facility];  Service: Gynecology;  Laterality: N/A;  . EYE SURGERY Bilateral    laser right and cataract removed left eye  . TUBAL LIGATION    .  UPPER GI ENDOSCOPY  07/2017   Social History   Occupational History  . Not on file  Tobacco Use  . Smoking status: Current Every Day Smoker    Packs/day: 0.50    Years: 15.00    Pack years: 7.50    Types: Cigarettes  . Smokeless tobacco: Never Used  Substance and Sexual Activity  . Alcohol use: No  . Drug use: No  . Sexual activity: Not Currently    Birth control/protection: None

## 2019-09-01 ENCOUNTER — Telehealth: Payer: Self-pay | Admitting: Pulmonary Disease

## 2019-09-01 DIAGNOSIS — J449 Chronic obstructive pulmonary disease, unspecified: Secondary | ICD-10-CM

## 2019-09-01 MED ORDER — ALBUTEROL SULFATE (2.5 MG/3ML) 0.083% IN NEBU: 2.5000 mg | INHALATION_SOLUTION | Freq: Four times a day (QID) | RESPIRATORY_TRACT | 1 refills | Status: DC | PRN

## 2019-09-01 NOTE — Telephone Encounter (Signed)
Patient is returning phone call.  Patient phone number is [phone].

## 2019-09-01 NOTE — Telephone Encounter (Signed)
LMTCB

## 2019-09-01 NOTE — Telephone Encounter (Signed)
Spoke with patient. She was requesting a refill on her albuterol neb solution as well as an order for a nebulizer machine. Patient was seen by BI back in August and stated that she did not get the neb machine because she could not go into the office to put up the machine from [facility]. She called them again today and they stated that they would be able to provide her with a machine unless they have a new order. Advised her I would go ahead and place the orders for her, she verbalized understanding.   Patient also wanted to see if she could go ahead and get setup for her COVID test for her PFT on 10/01/19. Advised her will I would send a message to our PCCs. Patient verbalized understanding.

## 2019-09-02 MED FILL — ALBUTEROL SUL 2.5 MG/3 ML S: (2.5 MG/3ML | 12 days supply | Qty: 150 | Fill #0

## 2019-09-02 NOTE — Telephone Encounter (Signed)
Neb order has been sent will call the patient and sec up covid test

## 2019-09-03 ENCOUNTER — Ambulatory Visit: Payer: No Typology Code available for payment source | Admitting: Podiatry

## 2019-09-03 ENCOUNTER — Telehealth: Payer: Self-pay | Admitting: Family Medicine

## 2019-09-03 ENCOUNTER — Telehealth: Payer: Self-pay | Admitting: Obstetrics & Gynecology

## 2019-09-03 DIAGNOSIS — G8929 Other chronic pain: Secondary | ICD-10-CM

## 2019-09-03 DIAGNOSIS — M25561 Pain in right knee: Secondary | ICD-10-CM

## 2019-09-03 NOTE — Telephone Encounter (Signed)
Patient called stating that she has received the cane and the walker, she is wanting a brace to go around her knee.  CB#[phone].  Thank you.

## 2019-09-03 NOTE — Telephone Encounter (Signed)
Called pt; VM left stating I was returning her phone call regarding her questions.

## 2019-09-03 NOTE — Telephone Encounter (Signed)
Had surgery done 2 weeks ago. She has some questions.

## 2019-09-03 NOTE — Telephone Encounter (Signed)
Please advise. We do have hinged knee braces in larger sizes (up to 3X), if you think that would be an appropriate type for her.

## 2019-09-03 NOTE — Telephone Encounter (Signed)
We can try a hinged brace.  If doesn't fit, we'll ask Thurmond Butts what options he has.

## 2019-09-04 ENCOUNTER — Other Ambulatory Visit: Payer: Self-pay

## 2019-09-04 ENCOUNTER — Telehealth: Payer: Self-pay

## 2019-09-04 MED ORDER — ONGLYZA 2.5 MG PO TABS: 2.5000 mg | ORAL_TABLET | Freq: Every day | ORAL | 3 refills | Status: DC

## 2019-09-04 NOTE — Telephone Encounter (Signed)
I called the patient. She will come tomorrow afternoon to see me about a hinged knee brace.

## 2019-09-04 NOTE — Telephone Encounter (Signed)
Patient called today asking if she can have an order for a brace or glove for Carpule tunnel sent to [facility]? Please advise if this can be done. Thanks !

## 2019-09-05 ENCOUNTER — Ambulatory Visit (INDEPENDENT_AMBULATORY_CARE_PROVIDER_SITE_OTHER): Payer: Self-pay

## 2019-09-05 ENCOUNTER — Other Ambulatory Visit: Payer: Self-pay

## 2019-09-05 DIAGNOSIS — M25561 Pain in right knee: Secondary | ICD-10-CM

## 2019-09-05 DIAGNOSIS — G8929 Other chronic pain: Secondary | ICD-10-CM

## 2019-09-05 MED FILL — !ONGLYZA 2.5 MG TAB: 2.5 | 30 days supply | Qty: 30 | Fill #0

## 2019-09-05 NOTE — Telephone Encounter (Signed)
Faxed order to [facility] today 09/05/2019 @4 :28pm .Thanks!

## 2019-09-05 NOTE — Progress Notes (Signed)
Patient came in to be fitted for a hinged knee brace per Dr. Junius Roads.

## 2019-09-12 ENCOUNTER — Ambulatory Visit: Payer: Self-pay | Admitting: Family Medicine

## 2019-09-12 ENCOUNTER — Other Ambulatory Visit (HOSPITAL_COMMUNITY)
Admission: RE | Admit: 2019-09-12 | Discharge: 2019-09-12 | Disposition: A | Discharge status: Home / Self Care | Payer: Self-pay | Source: Ambulatory Visit | Attending: Family Medicine | Admitting: Family Medicine

## 2019-09-12 DIAGNOSIS — Z01812 Encounter for preprocedural laboratory examination: Secondary | ICD-10-CM | POA: Insufficient documentation

## 2019-09-12 DIAGNOSIS — Z20828 Contact with and (suspected) exposure to other viral communicable diseases: Secondary | ICD-10-CM | POA: Insufficient documentation

## 2019-09-12 LAB — SARS CORONAVIRUS 2 (TAT 6-24 HRS): SARS Coronavirus 2: NEGATIVE

## 2019-09-15 ENCOUNTER — Encounter (HOSPITAL_COMMUNITY): Payer: Self-pay | Admitting: *Deleted

## 2019-09-15 NOTE — Progress Notes (Signed)
Patient denies shortness of breath, fever, cough and chest pain.  PCP - Dr Jiles Harold @[facility]  [place] Cardiologist - Denies  Chest x-ray - 02/07/19, 1 view EKG - 03/08/19 Stress Test - 08/24/14 CE ECHO - 04/16/17 Cardiac Cath - denies  Fasting Blood Sugar - 180s Checks Blood Sugar _2-3_ times a day  . Do not take oral diabetes medicines (onglyza) the morning of surgery.  . THE NIGHT BEFORE SURGERY, take 70 units Humalog 75/25 insulin     . THE MORNING OF SURGERY, do not take any insulin.  . If your blood sugar is less than 70 mg/dL, you will need to treat for low blood sugar: o Treat a low blood sugar (less than 70 mg/dL) with  cup of clear juice (cranberry or apple),  o Recheck blood sugar in 15 minutes after treatment (to make sure it is greater than 70 mg/dL). If your blood sugar is not greater than 70 mg/dL on recheck, call [phone] for further instructions.  Anesthesia review: Yes, Ebony Hail, PA  STOP now taking any Aspirin (unless otherwise instructed by your surgeon), Aleve, Naproxen, Ibuprofen, Motrin, Advil, Goody's, BC's, all herbal medications, fish oil, and all vitamins.   Coronavirus Screening Covid test on 09/12/19 was negative.  Patient verbalized understanding of instructions that were given to them via phone.

## 2019-09-15 NOTE — Anesthesia Preprocedure Evaluation (Addendum)
Anesthesia Evaluation  Patient identified by MRN, date of birth, ID band Patient awake    Reviewed: Allergy & Precautions, NPO status , Patient's Chart, lab work & pertinent test results  Airway Mallampati: II  TM Distance: >3 FB     Dental  (+) Dental Advisory Given   Pulmonary asthma , COPD, Current Smoker and Patient abstained from smoking.,    breath sounds clear to auscultation       Cardiovascular hypertension, Pt. on medications and Pt. on home beta blockers  Rhythm:Regular Rate:Normal     Neuro/Psych Seizures -,     GI/Hepatic Neg liver ROS, GERD  ,  Endo/Other  diabetes, Type 2, Insulin DependentMorbid obesity  Renal/GU Renal disease     Musculoskeletal  (+) Arthritis ,   Abdominal   Peds  Hematology  (+) anemia ,   Anesthesia Other Findings   Reproductive/Obstetrics                            Anesthesia Physical Anesthesia Plan  ASA: III  Anesthesia Plan: General   Post-op Pain Management:    Induction: Intravenous  PONV Risk Score and Plan: 2 and Dexamethasone, Ondansetron and Treatment may vary due to age or medical condition  Airway Management Planned: Oral ETT  Additional Equipment:   Intra-op Plan:   Post-operative Plan: Extubation in OR  Informed Consent: I have reviewed the patients History and Physical, chart, labs and discussed the procedure including the risks, benefits and alternatives for the proposed anesthesia with the patient or authorized representative who has indicated his/her understanding and acceptance.     Dental advisory given  Plan Discussed with: CRNA  Anesthesia Plan Comments: ( )       Anesthesia Quick Evaluation

## 2019-09-15 NOTE — Progress Notes (Signed)
Anesthesia Chart Review: Jill Shaw    Case: [id number] Date/Time: 09/16/19 0745   Procedure: MRI WITH ANESTHESIA   L SPINE WITHOUT CONTRAST, T SPINE WITHOUT CONTRAST , CERVICAL WITHOUT CONTRAST (N/A )   Anesthesia type: General   Pre-op diagnosis: INTERVERTEBRAL DISC DEGENERATION   Location: [facility] OR RADIOLOGY ROOM / [place] OR   Surgeon: Radiologist, Medication, MD      DISCUSSION: Patient is a 43 year old female scheduled for MRI under anesthesia. There are orders for MR cervical, thoracic, and lumber spine in [facility]. She has a visit with exam on 08/29/19 by Eunice Blase, MD (in [facility]). She is s/p D&C, Minerva ablation 08/20/19 by Clovia Cuff, MD. She underwent MRI bilateral knees on 07/23/19 and MRI of the brain on 02/14/19 without anesthesia, but reportedly, it was quite difficult for her to complete her last MRI, even after taking oral sedating medications. Subsequently, this MRI scheduled with monitored anesthesia care.   History includes smoking, HTN, Sickle cell trait, DM2, COPD, asthma, GERD, anemia, neuropathy, seizure-like activity (01/2016, history of hypoglycemic seizure), obesity.  Multiple ED visits. Two more recent visits include: - ED visit 03/08/19 for unresponsiveness in the setting of narcotics, Zanaflex and ETOH. Symptoms improved after Narcan x2 (EMS, ED). She denied intentional OD. - ED visit 02/07/19 for hypoglycemia, altered mental status, hypotension. Required D50 and then D5 drip. Given IVF. Consider accidental insulin overdose. (Normal brain MRI 02/14/19.)  She is a same day work-up, so labs on the day of procedure. As of 08/05/19, labs showed stable H/H 8.9/29.7. Last A1c 8.4 on 08/05/19. 09/12/19 COVID-19 test negative.     PROVIDERS: Tresa Garter, MD is listed as PCP. Patient seen by Cammie Sickle, FNP on 08/05/19 for routine follow-up of chronic medical conditions. Advised to schedule neurology follow-up for management of neuropathy that has failed multiple  medications. Split night study also ordered. 3 month follow-up planned.   Hilts, Legrand Como, MD is sports medicine provider (board certified in primary care sports medicine and family medicine). Last visit 08/29/19.  Clovia Cuff, MD is GYN.    LABS: Updated labs per anesthesia on the day of procedure. Currently, most recent lab results in [facility] include: Lab Results  Component Value Date   WBC 10.0 08/05/2019   HGB 8.9 (L) 08/05/2019   HCT 29.7 (L) 08/05/2019   PLT 303 08/05/2019   GLUCOSE 110 (H) 08/05/2019   ALT 25 06/06/2019   AST 72 (H) 06/06/2019   NA 138 08/05/2019   K 4.2 08/05/2019   CL 101 08/05/2019   CREATININE 0.96 08/05/2019   BUN 12 08/05/2019   CO2 23 08/05/2019   HGBA1C 8.4 (A) 08/05/2019   HGB 7.4-8.9 since 10/20/18.  AST 53-119 since 03/08/19.    IMAGES: MRI Brain 02/14/19: IMPRESSION: Normal brain MRI.  1V CXR 02/07/19: IMPRESSION: No acute cardiopulmonary abnormality.   EKG: 03/08/19: Sinus rhythm Low voltage, precordial leads Borderline repolarization abnormality prolonged QT (QT 428 ms/QTc 503 ms) Confirmed by Randal Buba, April ([id number]) on 03/08/2019 5:35:03 AM   CV: Echo 04/16/17: Study Conclusions - Left ventricle: The cavity size was normal. Wall thickness was increased in a pattern of mild LVH. Systolic function was normal. The estimated ejection fraction was in the range of 55% to 60%. Mildly reduced GLPSS at -15%. Wall motion was normal; there were no regional wall motion abnormalities. The study is not technically sufficient to allow evaluation of LV diastolic function. - Aortic valve: Sclerosis without stenosis. There was no regurgitation. - Left atrium: The  atrium was normal in size. - Inferior vena cava: The vessel was normal in size. The respirophasic diameter changes were in the normal range (= 50%), consistent with normal central venous pressure. Impressions: - LVEF 55-60%, mild LVH, normla wall motion, mildly  reduced GLPSS at -15%, normal LA size, normal IVC.   Past Medical History:  Diagnosis Date  . Anemia   . Arthritis    knees, hands  . Asthma   . COPD (chronic obstructive pulmonary disease) ([place])   . Diabetes mellitus without complication ([facility])    type 2  . Dysfunctional uterine bleeding   . GERD (gastroesophageal reflux disease)   . Hypertension   . Neuromuscular disorder (HCC)    neuropathy feet  . Seizures ([place]) 09/12/2017   pt states r/t stress and blood sugar - no meds last one 4 months ago, not seen neurologist  . Sickle cell trait ([place])   . Smoker   . Wears glasses     Past Surgical History:  Procedure Laterality Date  . CESAREAN SECTION     x 1  . DILATION AND CURETTAGE OF UTERUS N/A 08/20/2019   Procedure: DILATATION AND CURETTAGE;  Surgeon: Emily Filbert, MD;  Location: [facility];  Service: Gynecology;  Laterality: N/A;  . ENDOMETRIAL ABLATION N/A 08/20/2019   Procedure: Minerva Ablation;  Surgeon: Emily Filbert, MD;  Location: [facility];  Service: Gynecology;  Laterality: N/A;  . EYE SURGERY Bilateral    laser right and cataract removed left eye  . TUBAL LIGATION    . UPPER GI ENDOSCOPY  07/2017    MEDICATIONS: No current facility-administered medications for this encounter.    Marland Kitchen albuterol (PROVENTIL HFA;VENTOLIN HFA) 108 (90 Base) MCG/ACT inhaler  . albuterol (PROVENTIL) (2.5 MG/3ML) 0.083% nebulizer solution  . budesonide-formoterol (SYMBICORT) 160-4.5 MCG/ACT inhaler  . cetirizine (ZYRTEC) 10 MG tablet  . Cholecalciferol (VITAMIN D-3) 125 MCG (5000 UT) TABS  . cyclobenzaprine (FLEXERIL) 5 MG tablet  . ferrous sulfate 325 (65 FE) MG tablet  . fluticasone (FLONASE) 50 MCG/ACT nasal spray  . furosemide (LASIX) 80 MG tablet  . Glucosamine Sulfate 1000 MG CAPS  . hydrocortisone 2.5 % cream  . Insulin Lispro Prot & Lispro (HUMALOG MIX 75/25 KWIKPEN) (75-25) 100 UNIT/ML Kwikpen  . losartan (COZAAR) 100 MG tablet  . metoprolol succinate (TOPROL-XL) 25 MG 24 hr  tablet  . omeprazole (PRILOSEC) 40 MG capsule  . potassium chloride SA (K-DUR) 20 MEQ tablet  . saxagliptin HCl (ONGLYZA) 2.5 MG TABS tablet  . Turmeric 500 MG CAPS  . acetaminophen (TYLENOL 8 HOUR) 650 MG CR tablet  . Blood Glucose Monitoring Suppl (TRUE METRIX METER) w/Device KIT  . diclofenac sodium (VOLTAREN) 1 % GEL  . glucose blood (TRUE METRIX BLOOD GLUCOSE TEST) test strip  . Insulin Pen Needle (PEN NEEDLES) 30G X 5 MM MISC     Myra Gianotti, PA-C Surgical Short Stay/Anesthesiology [facility] Phone [phone] [facility] Phone [phone] 09/15/2019 3:37 PM

## 2019-09-16 ENCOUNTER — Ambulatory Visit (HOSPITAL_COMMUNITY)
Admission: RE | Admit: 2019-09-16 | Discharge: 2019-09-16 | Disposition: A | Discharge status: Home / Self Care | Payer: Self-pay | Source: Ambulatory Visit | Attending: Family Medicine | Admitting: Family Medicine

## 2019-09-16 ENCOUNTER — Other Ambulatory Visit: Payer: Self-pay

## 2019-09-16 ENCOUNTER — Encounter (HOSPITAL_COMMUNITY): Payer: Self-pay

## 2019-09-16 ENCOUNTER — Ambulatory Visit (HOSPITAL_COMMUNITY): Payer: Self-pay | Admitting: Anesthesiology

## 2019-09-16 ENCOUNTER — Encounter (HOSPITAL_COMMUNITY): Admission: RE | Disposition: A | Discharge status: Home / Self Care | Payer: Self-pay | Source: Ambulatory Visit

## 2019-09-16 ENCOUNTER — Ambulatory Visit (HOSPITAL_COMMUNITY): Payer: Self-pay

## 2019-09-16 ENCOUNTER — Ambulatory Visit (HOSPITAL_COMMUNITY): Admission: RE | Admit: 2019-09-16 | Payer: Self-pay | Source: Ambulatory Visit

## 2019-09-16 DIAGNOSIS — M5136 Other intervertebral disc degeneration, lumbar region: Secondary | ICD-10-CM

## 2019-09-16 DIAGNOSIS — M542 Cervicalgia: Secondary | ICD-10-CM

## 2019-09-16 DIAGNOSIS — M549 Dorsalgia, unspecified: Secondary | ICD-10-CM

## 2019-09-16 DIAGNOSIS — M5134 Other intervertebral disc degeneration, thoracic region: Secondary | ICD-10-CM

## 2019-09-16 DIAGNOSIS — R296 Repeated falls: Secondary | ICD-10-CM | POA: Insufficient documentation

## 2019-09-16 DIAGNOSIS — M79604 Pain in right leg: Secondary | ICD-10-CM | POA: Insufficient documentation

## 2019-09-16 DIAGNOSIS — E882 Lipomatosis, not elsewhere classified: Secondary | ICD-10-CM | POA: Insufficient documentation

## 2019-09-16 DIAGNOSIS — M79605 Pain in left leg: Secondary | ICD-10-CM | POA: Insufficient documentation

## 2019-09-16 DIAGNOSIS — R531 Weakness: Secondary | ICD-10-CM | POA: Insufficient documentation

## 2019-09-16 DIAGNOSIS — G8929 Other chronic pain: Secondary | ICD-10-CM

## 2019-09-16 HISTORY — PX: RADIOLOGY WITH ANESTHESIA: SHX6223

## 2019-09-16 LAB — BASIC METABOLIC PANEL
Anion gap: 16 — ABNORMAL HIGH (ref 5–15)
BUN: 8 mg/dL (ref 6–20)
CO2: 20 mmol/L — ABNORMAL LOW (ref 22–32)
Calcium: 9 mg/dL (ref 8.9–10.3)
Chloride: 104 mmol/L (ref 98–111)
Creatinine, Ser: 1.1 mg/dL — ABNORMAL HIGH (ref 0.44–1.00)
GFR calc Af Amer: >60 mL/min (ref >60)
GFR calc non Af Amer: >60 mL/min (ref >60)
Glucose, Bld: 81 mg/dL (ref 70–99)
Potassium: 3.4 mmol/L — ABNORMAL LOW (ref 3.5–5.1)
Sodium: 140 mmol/L (ref 135–145)

## 2019-09-16 LAB — CBC
HCT: 28.3 % — ABNORMAL LOW (ref 36.0–46.0)
Hemoglobin: 8.3 g/dL — ABNORMAL LOW (ref 12.0–15.0)
MCH: 21.6 pg — ABNORMAL LOW (ref 26.0–34.0)
MCHC: 29.3 g/dL — ABNORMAL LOW (ref 30.0–36.0)
MCV: 73.5 fL — ABNORMAL LOW (ref 80.0–100.0)
Platelets: 255 10*3/uL (ref 150–400)
RBC: 3.85 MIL/uL — ABNORMAL LOW (ref 3.87–5.11)
RDW: 21.9 % — ABNORMAL HIGH (ref 11.5–15.5)
WBC: 9.6 10*3/uL (ref 4.0–10.5)
nRBC: 0 % (ref 0.0–0.2)

## 2019-09-16 LAB — GLUCOSE, CAPILLARY
Glucose-Capillary: 86 mg/dL (ref 70–99)
Glucose-Capillary: 94 mg/dL (ref 70–99)
Glucose-Capillary: 130 mg/dL — ABNORMAL HIGH (ref 70–99)

## 2019-09-16 LAB — POCT PREGNANCY, URINE: Preg Test, Ur: NEGATIVE

## 2019-09-16 SURGERY — MRI WITH ANESTHESIA
Anesthesia: General

## 2019-09-16 MED ORDER — SUGAMMADEX SODIUM 200 MG/2ML IV SOLN
INTRAVENOUS | Status: DC | PRN
  Administered 2019-09-16: 155 mg via INTRAVENOUS
  Administered 2019-09-16: 245 mg via INTRAVENOUS

## 2019-09-16 MED ORDER — MIDAZOLAM HCL 2 MG/2ML IJ SOLN
INTRAMUSCULAR | Status: DC | PRN
  Administered 2019-09-16: 2 mg via INTRAVENOUS

## 2019-09-16 MED ORDER — LACTATED RINGERS IV SOLN
INTRAVENOUS | Status: DC | PRN
  Administered 2019-09-16: 08:00:00 via INTRAVENOUS

## 2019-09-16 MED ORDER — ONDANSETRON HCL 4 MG/2ML IJ SOLN
INTRAMUSCULAR | Status: DC | PRN
  Administered 2019-09-16: 4 mg via INTRAVENOUS

## 2019-09-16 MED ORDER — ALBUTEROL SULFATE (2.5 MG/3ML) 0.083% IN NEBU
INHALATION_SOLUTION | RESPIRATORY_TRACT | Status: AC
  Filled 2019-09-16: qty 3

## 2019-09-16 MED ORDER — GLYCOPYRROLATE PF 0.2 MG/ML IJ SOSY
PREFILLED_SYRINGE | INTRAMUSCULAR | Status: DC | PRN
  Administered 2019-09-16: .2 mg via INTRAVENOUS

## 2019-09-16 MED ORDER — ALBUTEROL SULFATE (2.5 MG/3ML) 0.083% IN NEBU
2.5000 mg | INHALATION_SOLUTION | Freq: Four times a day (QID) | RESPIRATORY_TRACT | Status: DC | PRN
  Administered 2019-09-16: 2.5 mg via RESPIRATORY_TRACT

## 2019-09-16 MED ORDER — PROPOFOL 10 MG/ML IV BOLUS
INTRAVENOUS | Status: DC | PRN
  Administered 2019-09-16: 200 mg via INTRAVENOUS

## 2019-09-16 MED ORDER — MIDAZOLAM HCL 2 MG/2ML IJ SOLN
INTRAMUSCULAR | Status: AC
  Filled 2019-09-16: qty 2

## 2019-09-16 MED ORDER — SUCCINYLCHOLINE CHLORIDE 200 MG/10ML IV SOSY
PREFILLED_SYRINGE | INTRAVENOUS | Status: DC | PRN
  Administered 2019-09-16: 140 mg via INTRAVENOUS

## 2019-09-16 MED ORDER — LIDOCAINE 2% (20 MG/ML) 5 ML SYRINGE
INTRAMUSCULAR | Status: DC | PRN
  Administered 2019-09-16: 100 mg via INTRAVENOUS

## 2019-09-16 MED ORDER — FENTANYL CITRATE (PF) 250 MCG/5ML IJ SOLN
INTRAMUSCULAR | Status: AC
  Filled 2019-09-16: qty 5

## 2019-09-16 MED ORDER — ALBUTEROL SULFATE HFA 108 (90 BASE) MCG/ACT IN AERS
INHALATION_SPRAY | RESPIRATORY_TRACT | Status: DC | PRN
  Administered 2019-09-16: 5 via RESPIRATORY_TRACT

## 2019-09-16 MED ORDER — DEXAMETHASONE SODIUM PHOSPHATE 10 MG/ML IJ SOLN
INTRAMUSCULAR | Status: DC | PRN
  Administered 2019-09-16: 10 mg via INTRAVENOUS

## 2019-09-16 MED ORDER — ROCURONIUM BROMIDE 10 MG/ML (PF) SYRINGE
PREFILLED_SYRINGE | INTRAVENOUS | Status: DC | PRN
  Administered 2019-09-16: 50 mg via INTRAVENOUS
  Administered 2019-09-16: 20 mg via INTRAVENOUS

## 2019-09-16 NOTE — Transfer of Care (Signed)
Immediate Anesthesia Transfer of Care Note  Patient: Jill Shaw  Procedure(s) Performed: MRI WITH ANESTHESIA   L SPINE WITHOUT CONTRAST, T SPINE WITHOUT CONTRAST , CERVICAL WITHOUT CONTRAST (N/A )  Patient Location: PACU  Anesthesia Type: General   Level of Consciousness: awake, patient cooperative and responds to stimulation  Airway & Oxygen Therapy: Patient Spontanous Breathing, Patient connected to nasal cannula oxygen and Albuterol neb  Post-op Assessment: Report given to RN, Post -op Vital signs reviewed and stable and Patient moving all extremities X 4  Post vital signs: Reviewed and stable  Last Vitals:  Vitals Value Taken Time  BP 116/52 09/16/19 1009  Temp    Pulse 101 09/16/19 1015  Resp 24 09/16/19 1015  SpO2 97 % 09/16/19 1015  Vitals shown include unvalidated device data.  Last Pain:  Vitals:   09/16/19 0643  PainSc: 7       Patients Stated Pain Goal: 3 (09/40/76 8088)  Complications: No apparent anesthesia complications

## 2019-09-16 NOTE — Discharge Instructions (Addendum)
NO SMOKING REMAINDER OF TODAY ! RESUME ALL YOUR MEDICATIONS  IF YOU GET SHORT OF BREATH ( BEYOND NORMAL) PLEASE CALL 911 AND / OR COME TO THE EMERGENCY DEPARTMENT !

## 2019-09-16 NOTE — Anesthesia Postprocedure Evaluation (Signed)
Anesthesia Post Note  Patient: Jill Shaw  Procedure(s) Performed: MRI WITH ANESTHESIA   L SPINE WITHOUT CONTRAST, T SPINE WITHOUT CONTRAST , CERVICAL WITHOUT CONTRAST (N/A )     Patient location during evaluation: PACU Anesthesia Type: General Level of consciousness: awake and alert Pain management: pain level controlled Vital Signs Assessment: post-procedure vital signs reviewed and stable Respiratory status: spontaneous breathing, nonlabored ventilation, respiratory function stable and patient connected to nasal cannula oxygen Cardiovascular status: blood pressure returned to baseline and stable Postop Assessment: no apparent nausea or vomiting Anesthetic complications: no    Last Vitals:  Vitals:   09/16/19 1058 09/16/19 1059  BP: (!) 118/55   Pulse:    Resp: (!) 22   Temp:  36.7 C  SpO2: 93%     Last Pain:  Vitals:   09/16/19 0643  PainSc: 7                  Tiajuana Amass

## 2019-09-16 NOTE — Progress Notes (Signed)
Pt up in recliner. O2 sats 91-94% on RA. Her only c/o is  "my stomach hurts from coughing so much". Dr Deatra Canter fully updated-OK to move pt to Phase 2.

## 2019-09-17 ENCOUNTER — Ambulatory Visit: Payer: Self-pay | Admitting: Family Medicine

## 2019-09-17 ENCOUNTER — Encounter (HOSPITAL_COMMUNITY): Payer: Self-pay | Admitting: Radiology

## 2019-09-17 ENCOUNTER — Telehealth: Payer: Self-pay

## 2019-09-17 MED FILL — !ONGLYZA 2.5 MG TAB: 2.5 | 30 days supply | Qty: 30 | Fill #0

## 2019-09-17 MED FILL — ?FUROSEMIDE 80MG TABS: 80 | 30 days supply | Qty: 30 | Fill #2

## 2019-09-17 MED FILL — ?OMEPRAZOLE 20MG CAP DR: 20 | 30 days supply | Qty: 60 | Fill #3

## 2019-09-17 NOTE — Telephone Encounter (Signed)
Pt called and stated that she had a procedure on 08/20/19 and she is having bleeding.  Called pt and pt states that she has been having vaginal bleeding since her Ablation on 08/20/19.  Per chart review pt has not had a post op appt.  I advised pt that she would need to schedule an appt with Dr. Hulan Fray for f/u. I informed pt that someone from the front office will call her with an appt and to keep in mind that with rise of COVID her appt may be virtual.  Pt verbalized understanding.

## 2019-09-19 ENCOUNTER — Other Ambulatory Visit: Payer: Self-pay | Admitting: Family Medicine

## 2019-09-19 DIAGNOSIS — M62838 Other muscle spasm: Secondary | ICD-10-CM

## 2019-09-19 MED FILL — LOSARTAN POTASSIUM 100 MG T: 100 | 30 days supply | Qty: 30 | Fill #5

## 2019-09-19 MED FILL — ?METOPROLOL SUCC ER 25MG TA: 25 | 30 days supply | Qty: 30 | Fill #3

## 2019-09-19 MED FILL — CYCLOBENZAPRINE 5 MG TABLET: 5 | 10 days supply | Qty: 30 | Fill #0

## 2019-09-19 NOTE — Telephone Encounter (Signed)
[place],  Can this be refilled?

## 2019-09-20 ENCOUNTER — Encounter (HOSPITAL_BASED_OUTPATIENT_CLINIC_OR_DEPARTMENT_OTHER): Payer: Self-pay | Admitting: Internal Medicine

## 2019-09-22 ENCOUNTER — Telehealth: Payer: Self-pay | Admitting: Obstetrics & Gynecology

## 2019-09-22 ENCOUNTER — Telehealth: Payer: Self-pay | Admitting: Family Medicine

## 2019-09-22 MED FILL — ?HUMALOG MIX 75-25 KWIKPEN: (75-25) 100 | 28 days supply | Qty: 60 | Fill #2

## 2019-09-22 NOTE — Telephone Encounter (Signed)
Patient would like to speak to clinical staff about her bleeding.

## 2019-09-22 NOTE — Telephone Encounter (Signed)
Patient called and stated wanted to speak to Terri in regards to wheel chair.  [phone]  Please call patient to advise

## 2019-09-23 ENCOUNTER — Telehealth: Payer: Self-pay | Admitting: Internal Medicine

## 2019-09-23 NOTE — Telephone Encounter (Signed)
done

## 2019-09-23 NOTE — Telephone Encounter (Signed)
Pt left VM on nurse line stating she was returning a call from our office.   Called pt. Pt reports she has had spotting since procedure on 11/4. Pt states she had a menstrual period starting on 11/26 for 8 days after which she continued spotting.   Pt would like to be seen by Dr. Hulan Fray prior to scheduled appt on 1/26. I explained to pt that this is Dr. Alease Medina first available appt, but she may call the front office to schedule an appt with another provider to discuss bleeding further.

## 2019-09-23 NOTE — Telephone Encounter (Signed)
The patient is asking for an Rx for a Rollator and a handicap placard application.

## 2019-09-23 NOTE — Telephone Encounter (Signed)
Done

## 2019-09-23 NOTE — Telephone Encounter (Signed)
Called pt in regards to her concerns about vaginal bleeding post uterine ablation. Pt did not answer. LM for pt to call the office at her earliest convenience.

## 2019-09-23 NOTE — Telephone Encounter (Signed)
Left a message on the patient's voice mail, advising her that a 6 mos' temporary handicap placard application and an Rx for a rollator are ready for pickup.

## 2019-09-24 ENCOUNTER — Other Ambulatory Visit: Payer: Self-pay | Admitting: Family Medicine

## 2019-09-24 DIAGNOSIS — E119 Type 2 diabetes mellitus without complications: Secondary | ICD-10-CM

## 2019-09-24 DIAGNOSIS — Z794 Long term (current) use of insulin: Secondary | ICD-10-CM

## 2019-09-24 MED ORDER — INSULIN LISPRO PROT & LISPRO (75-25 MIX) 100 UNIT/ML KWIKPEN: PEN_INJECTOR | SUBCUTANEOUS | 11 refills | Status: DC

## 2019-09-24 NOTE — Progress Notes (Signed)
Meds ordered this encounter  Medications  . Insulin Lispro Prot & Lispro (HUMALOG MIX 75/25 KWIKPEN) (75-25) 100 UNIT/ML Kwikpen    Sig: INJECT 100 UNITS EVERY 12 HOURS    Dispense:  15 mL    Refill:  11    Order Specific Question:   Supervising Provider    Answer:   Tresa Garter [[id number]]    Donia Pounds  APRN, MSN, FNP-C Patient [facility] [address] [place], [place] [address] [phone]

## 2019-09-25 MED FILL — ?CETIRIZINE HCL 10 MG TABLE: 10 | 30 days supply | Qty: 30 | Fill #2

## 2019-09-25 MED FILL — ?POTASSIUM CL ER 10 MEQ TAB: 10 MEQ | 30 days supply | Qty: 60 | Fill #3

## 2019-09-27 ENCOUNTER — Other Ambulatory Visit (HOSPITAL_COMMUNITY): Payer: Self-pay | Attending: Internal Medicine

## 2019-09-29 ENCOUNTER — Other Ambulatory Visit: Payer: Self-pay

## 2019-09-29 ENCOUNTER — Telehealth: Payer: Self-pay | Admitting: Family Medicine

## 2019-09-29 ENCOUNTER — Ambulatory Visit: Payer: No Typology Code available for payment source | Admitting: Podiatry

## 2019-09-29 DIAGNOSIS — M722 Plantar fascial fibromatosis: Secondary | ICD-10-CM

## 2019-09-29 DIAGNOSIS — M76821 Posterior tibial tendinitis, right leg: Secondary | ICD-10-CM

## 2019-09-29 NOTE — Telephone Encounter (Signed)
Jill Shaw, a 43 year old female with a medical history significant for uncontrolled type 2 diabetes mellitus, chronic pain syndrome, essential hypertension, morbid obesity, chronic back pain, menorrhagia, peripheral neuropathy, and anemia of chronic disease underwent a back MRI on September 16, 2019.  Patient notified office with an inquiry concerning MRI.  Call patient to discuss MRI results, no answer.  Left message.   Donia Pounds  APRN, MSN, FNP-C Patient [facility] [address] [place], [place] [address] [phone]

## 2019-09-29 NOTE — Telephone Encounter (Signed)
Done

## 2019-09-30 ENCOUNTER — Other Ambulatory Visit (HOSPITAL_COMMUNITY): Payer: Self-pay

## 2019-09-30 ENCOUNTER — Encounter (HOSPITAL_BASED_OUTPATIENT_CLINIC_OR_DEPARTMENT_OTHER): Payer: Self-pay | Admitting: Internal Medicine

## 2019-09-30 NOTE — Progress Notes (Signed)
Patient came to [facility] for covid testing, states she was having a sleep study tonight at 8:00pm. Rapid covid test was done. Patients sleep study had been canceled due to patient not coming on 12/12 for her covid test. A voicemail had been left for patient, but she did not receive it. Pt informed that her procedure has been canceled and that we are not able to run the rapid test collected for her. Patient does have a PFT scheduled for tomorrow 12/16 at 1600. Informed patient she would need to come back for repeat covid test (Hologic). Patient states she is unable to come back today, will come in the morning at 0800. Will run hologic test for patient tomorrow when she arrives and send straight over to lab.

## 2019-10-01 ENCOUNTER — Other Ambulatory Visit: Payer: Self-pay

## 2019-10-01 ENCOUNTER — Inpatient Hospital Stay (HOSPITAL_COMMUNITY)
Admission: RE | Admit: 2019-10-01 | Discharge: 2019-10-01 | Disposition: A | Discharge status: Home / Self Care | Payer: Self-pay | Source: Ambulatory Visit | Attending: Pulmonary Disease | Admitting: Pulmonary Disease

## 2019-10-01 ENCOUNTER — Other Ambulatory Visit (HOSPITAL_COMMUNITY): Payer: Self-pay

## 2019-10-01 DIAGNOSIS — Z20822 Contact with and (suspected) exposure to covid-19: Secondary | ICD-10-CM

## 2019-10-02 ENCOUNTER — Telehealth: Payer: Self-pay | Admitting: Pulmonary Disease

## 2019-10-02 NOTE — Telephone Encounter (Signed)
Called and spoke to pt. Pt states she was confused about when she needed her COVID test prior to her PFT. She states she received a call today from Lazaro Arms, NP, and was advised to do her covid test today at 5. I advised her there isnt an appt in the computer indicating this but to call Tonya back to verify what she needs to do to have her COVID test prior to her PFT on 12/22. Pt verbalized understanding and denied any further questions or concerns at this time.

## 2019-10-02 NOTE — Progress Notes (Signed)
   HPI: 43 year old female presents to office today for follow up evaluation of right foot pain. She states the pain is located in the posterior heel and is gradually worsening. She has been taking OTC Tylenol for treatment. Being on the foot increases the pain. Patient is here for further evaluation and treatment.   Past Medical History:  Diagnosis Date  . Anemia   . Arthritis    knees, hands  . Asthma   . COPD (chronic obstructive pulmonary disease) ([place])   . Diabetes mellitus without complication ([place])    type 2  . Dysfunctional uterine bleeding   . GERD (gastroesophageal reflux disease)   . Hypertension   . Neuromuscular disorder ([facility])    neuropathy feet  . Seizures ([place] [facility]) 09/12/2017   pt states r/t stress and blood sugar - no meds last one 4 months ago, not seen neurologist  . Sickle cell trait ([place])   . Smoker   . Wears glasses       Physical Exam: General: The patient is alert and oriented x3 in no acute distress.  Dermatology:  Medial border right hallux appears to be erythematous with evidence of an ingrowing nail. Pain on palpation noted to the border of the nail fold. Skin is warm, dry and supple bilateral lower extremities. Negative for open lesions or macerations.  Vascular: Palpable pedal pulses bilaterally. No edema or erythema noted. Capillary refill within normal limits.  Neurological: Epicritic and protective threshold grossly intact bilaterally.   Musculoskeletal Exam: Pain on palpation noted along the posterior tibial tendon of the right foot.  Limited range of motion noted with inversion and eversion of the subtalar joint right lower extremity.  Tenderness to palpation to the plantar aspect of the right heel along the plantar fascia. Muscle strength 5/5 in all groups bilateral.    MRI Impression from 08/01/2017:  1. 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 2. Generalized soft tissue edema about the ankle and included foot. 3. No tendinopathy or acute ligamentous pathology crossing the ankle joint. 4. Small plantar calcaneal enthesophyte.   Assessment: 1. Subtalar joint coalition-middle facet right 2. Posterior tibial tendinitis right  3. Plantar fasciitis right    Plan of Care:  1. Patient was evaluated. MRI reviewed.  2. Injection of 0.5 mLs Celestone Soluspan injected into the posterior tibial tendon sheath.  3. Injection of 0.5 mLs Celestone Soluspan injected into the right plantar fascia.  4. OTC insoles provided to patient.  5. Recommended good shoe gear.  6. Allergic to NSAIDs. Recommended OTC Tylenol. 7. Return to clinic as needed.    Edrick Kins, DPM [facility]  Dr. Edrick Kins, DPM    [address], [place] [address]                Office [phone]  Fax [phone]

## 2019-10-02 NOTE — Telephone Encounter (Signed)
PCCs do you know if pt was suppose to have her covid test today? Or if she needs to be rescheduled. Thanks.

## 2019-10-03 LAB — NOVEL CORONAVIRUS, NAA: SARS-CoV-2, NAA: NOT DETECTED

## 2019-10-03 NOTE — Telephone Encounter (Signed)
[  1:58 PM] Jill Shaw     Hey [name] dob:25-Aug-1976. The patient was scheduled for PFT on Wed but did not have her covid test. ?[1:59 PM] Renaldo Fiddler, Davy Pique     She went and had community test done own her own after I spoke with her on Wed. I was going to do PFT on yesterday @ 5. Her results are still not back. I told her Tuesday was next available slot but she must remain in [place]. [place]

## 2019-10-07 ENCOUNTER — Ambulatory Visit (INDEPENDENT_AMBULATORY_CARE_PROVIDER_SITE_OTHER): Payer: Self-pay | Admitting: Pulmonary Disease

## 2019-10-07 ENCOUNTER — Other Ambulatory Visit: Payer: Self-pay

## 2019-10-07 DIAGNOSIS — J449 Chronic obstructive pulmonary disease, unspecified: Secondary | ICD-10-CM

## 2019-10-07 LAB — PULMONARY FUNCTION TEST
DL/VA % pred: 121 %
DL/VA: 5.3 ml/min/mmHg/L
DLCO unc % pred: 85 %
DLCO unc: 19.24 ml/min/mmHg
FEF 25-75 Post: 1.21 L/sec
FEF 25-75 Pre: 1.71 L/sec
FEF2575-%Change-Post: -29 %
FEF2575-%Pred-Post: 42 %
FEF2575-%Pred-Pre: 60 %
FEV1-%Change-Post: -11 %
FEV1-%Pred-Post: 61 %
FEV1-%Pred-Pre: 68 %
FEV1-Post: 1.58 L
FEV1-Pre: 1.78 L
FEV1FVC-%Change-Post: -14 %
FEV1FVC-%Pred-Pre: 92 %
FEV6-%Change-Post: 4 %
FEV6-%Pred-Post: 77 %
FEV6-%Pred-Pre: 74 %
FEV6-Post: 2.41 L
FEV6-Pre: 2.3 L
FEV6FVC-%Pred-Post: 102 %
FEV6FVC-%Pred-Pre: 102 %
FVC-%Change-Post: 4 %
FVC-%Pred-Post: 76 %
FVC-%Pred-Pre: 72 %
FVC-Post: 2.41 L
FVC-Pre: 2.31 L
Post FEV1/FVC ratio: 66 %
Post FEV6/FVC ratio: 100 %
Pre FEV1/FVC ratio: 77 %
Pre FEV6/FVC Ratio: 100 %
RV % pred: 109 %
RV: 1.83 L
TLC % pred: 83 %
TLC: 4.35 L

## 2019-10-07 NOTE — Progress Notes (Signed)
Full PFT performed today. °

## 2019-10-14 ENCOUNTER — Telehealth: Payer: Self-pay | Admitting: Family Medicine

## 2019-10-14 ENCOUNTER — Other Ambulatory Visit: Payer: Self-pay | Admitting: Family Medicine

## 2019-10-14 DIAGNOSIS — L309 Dermatitis, unspecified: Secondary | ICD-10-CM

## 2019-10-14 MED ORDER — HYDROCORTISONE 2.5 % EX CREA: TOPICAL_CREAM | Freq: Two times a day (BID) | CUTANEOUS | 2 refills | Status: DC

## 2019-10-14 MED FILL — CYCLOBENZAPRINE 5 MG TABLET: 5 | 10 days supply | Qty: 30 | Fill #1

## 2019-10-14 MED FILL — HYDROCORTISONE 2.5% CREAM: 2.5 | 15 days supply | Qty: 30 | Fill #0

## 2019-10-14 MED FILL — ?POTASSIUM CL ER 10 MEQ TAB: 10 MEQ | 30 days supply | Qty: 60 | Fill #3

## 2019-10-14 NOTE — Progress Notes (Signed)
Meds ordered this encounter  Medications  . hydrocortisone 2.5 % cream    Sig: Apply topically 2 (two) times daily.    Dispense:  30 g    Refill:  2    Order Specific Question:   Supervising Provider    Answer:   Tresa Garter [[id number]]    Donia Pounds  APRN, MSN, FNP-C [facility] [address] [place], [place] [address] [phone]

## 2019-10-16 ENCOUNTER — Ambulatory Visit: Payer: Self-pay | Admitting: Pulmonary Disease

## 2019-10-17 DIAGNOSIS — E559 Vitamin D deficiency, unspecified: Secondary | ICD-10-CM

## 2019-10-17 HISTORY — DX: Vitamin D deficiency, unspecified: E55.9

## 2019-10-20 ENCOUNTER — Telehealth: Payer: Self-pay | Admitting: *Deleted

## 2019-10-20 NOTE — Telephone Encounter (Signed)
Sent to Np °

## 2019-10-20 NOTE — Telephone Encounter (Signed)
Pt states she has some questions about her injections.

## 2019-10-20 NOTE — Telephone Encounter (Signed)
Left message for pt to call with questions.

## 2019-10-21 MED FILL — LOSARTAN POTASSIUM 100 MG T: 100 | 30 days supply | Qty: 30 | Fill #6

## 2019-10-21 MED FILL — ?FUROSEMIDE 80MG TABS: 80 | 30 days supply | Qty: 30 | Fill #3

## 2019-10-21 MED FILL — ?HUMALOG MIX 75-25 KWIKPEN: (75-25) 100 | 21 days supply | Qty: 45 | Fill #0

## 2019-10-21 MED FILL — !ONGLYZA 2.5 MG TAB: 2.5 | 30 days supply | Qty: 30 | Fill #1

## 2019-10-21 NOTE — Telephone Encounter (Signed)
Pt called she has concerns from where Dr.Evans gave injections on 09/29/2019 now shes having complications with foot goes all the way to rt and cant pick foot up when trying to walk please call shes concerened

## 2019-10-22 ENCOUNTER — Encounter: Payer: Self-pay | Admitting: *Deleted

## 2019-10-22 ENCOUNTER — Telehealth: Payer: Self-pay | Admitting: Pulmonary Disease

## 2019-10-22 ENCOUNTER — Other Ambulatory Visit: Payer: Self-pay | Admitting: Family Medicine

## 2019-10-22 ENCOUNTER — Telehealth: Payer: Self-pay

## 2019-10-22 MED FILL — ?OMEPRAZOLE 20MG CAP DR: 20 | 30 days supply | Qty: 60 | Fill #0

## 2019-10-22 NOTE — Telephone Encounter (Signed)
Called pt but unable to reach. Left message for pt to return call. 

## 2019-10-22 NOTE — Telephone Encounter (Signed)
Pt called and stated that she had surgery on 08/20/19 and she has been bleeding for past 12 days.  Called pt and pt informed me that her period started on 10/11/19 and just stopped.  I advised pt that she can share her concern with Dr. Marice Potter at her appt on 11/11/19.  Pt stated that she may not be able to come to her appt because she may not have insurance.  I advised pt to please come to her appt we can give her a financial application and she will be able to get her concerns addressed.   Pt verbalized understanding.   Addison Naegeli, RN 10/22/19

## 2019-10-27 ENCOUNTER — Telehealth: Payer: Self-pay | Admitting: Family Medicine

## 2019-10-27 NOTE — Telephone Encounter (Signed)
[phone] pt calling back

## 2019-10-27 NOTE — Telephone Encounter (Signed)
lmtcb for pt.  

## 2019-10-27 NOTE — Telephone Encounter (Signed)
Called and spoke with pt who is requesting to know the results of the PFT which was performed 12/22. Dr. Tonia Brooms, please advise on this for pt. Thanks!

## 2019-10-28 NOTE — Telephone Encounter (Signed)
ERROR

## 2019-10-28 NOTE — Telephone Encounter (Signed)
Left message informing pt our office had made calls to discuss her problem and left messages for her to call, and recommended she contact our office to schedule to come in to discuss with Dr. Logan Bores.

## 2019-10-28 NOTE — Telephone Encounter (Signed)
LMTCB

## 2019-10-28 NOTE — Telephone Encounter (Signed)
PCCM:   PFT Results Latest Ref Rng & Units 10/07/2019  FVC-Pre L 2.31  FVC-Predicted Pre % 72  FVC-Post L 2.41  FVC-Predicted Post % 76  Pre FEV1/FVC % % 77  Post FEV1/FCV % % 66  FEV1-Pre L 1.78  FEV1-Predicted Pre % 68  FEV1-Post L 1.58  DLCO UNC% % 85  DLCO COR %Predicted % 121  TLC L 4.35  TLC % Predicted % 83  RV % Predicted % 109    Impression: Moderate obstruction is present, evidence of airtrapping   She needs to have follow up with myself or APP in clinic to discuss possible inhaler regimens and continued smoking cessation.  Josephine Igo, DO [facility] 10/28/2019 11:40 AM

## 2019-10-29 NOTE — Telephone Encounter (Signed)
[phone] pt calling back 

## 2019-10-29 NOTE — Telephone Encounter (Signed)
ATC pt, no answer. Left message for pt to call back.  

## 2019-10-29 NOTE — Telephone Encounter (Signed)
Pt returning call and can be reached @ [phone].Caren Griffins

## 2019-10-29 NOTE — Telephone Encounter (Signed)
Called and spoke with pt letting her know the results of the PFT and stated to her that BI wanted her to be scheduled for an appt to discuss results fully and treatment. Pt verbalized understanding and was scheduled for appt 2/3. Nothing further needed.

## 2019-10-29 NOTE — Telephone Encounter (Signed)
Called patient but she did not answer. Left message for patient to call back.  

## 2019-10-30 ENCOUNTER — Telehealth: Payer: Self-pay | Admitting: Pulmonary Disease

## 2019-10-30 ENCOUNTER — Other Ambulatory Visit: Payer: Self-pay | Admitting: Family Medicine

## 2019-10-30 DIAGNOSIS — J449 Chronic obstructive pulmonary disease, unspecified: Secondary | ICD-10-CM

## 2019-10-30 MED FILL — ?CETIRIZINE HCL 10 MG TABLE: 10 | 30 days supply | Qty: 30 | Fill #2

## 2019-10-30 MED FILL — METOPROLOL SUCCINATE ER 25: 25 | 30 days supply | Qty: 30 | Fill #4

## 2019-10-30 MED FILL — SYMBICORT 160-4.5 MCG INH: 160-4.5 | 30 days supply | Qty: 6 | Fill #0

## 2019-10-30 NOTE — Telephone Encounter (Signed)
Pt states she got an injection in her foot about 2 weeks ago and she is now kind of dragging. I offered pt an appt, the problem is one that would need to be evaluated in office and would send a message to Dr. Logan Bores for more information.

## 2019-10-30 NOTE — Telephone Encounter (Signed)
I do not see anything where a med was being called in for pt.  Called pt but unable to reach. Left message for pt to return call. We need to verify with pt which med she was talking about.

## 2019-10-30 NOTE — Telephone Encounter (Signed)
Pt returning call CB# [phone]

## 2019-10-30 NOTE — Telephone Encounter (Signed)
Pt called states call her any day after 1:00pm and she can't make an appt, because she does not have insurance.

## 2019-10-31 NOTE — Telephone Encounter (Signed)
LMTCB

## 2019-11-03 NOTE — Telephone Encounter (Signed)
ATC pt, no answer. Left message for pt to call back.  Will close encounter due to multiple attempts to reach pt, per triage protocal.   

## 2019-11-04 ENCOUNTER — Telehealth: Payer: Self-pay | Admitting: Pulmonary Disease

## 2019-11-04 MED ORDER — BREO ELLIPTA 200-25 MCG/INH IN AEPB: 1.0000 | INHALATION_SPRAY | Freq: Every day | RESPIRATORY_TRACT | 0 refills | Status: DC

## 2019-11-04 NOTE — Telephone Encounter (Signed)
We without insurance they are all going to be expensive.  So, she was on symbicort in the past. Ok to continue this or Breo 200.  But I suspect she will be dependent upon samples due to medication cost.  Josephine Igo, DO [facility] 11/04/2019 3:05 PM

## 2019-11-04 NOTE — Telephone Encounter (Signed)
Pt returned call. Pt states she was unsure if Dr. Tonia Brooms wanted her on a medication based off her PFT results. Advised pt that her upcoming appt with BI on 2.3.2021 is to review the results. Pt states she cannot come in for appt due to not having insurance coverage. Pt is requesting if Dr. Tonia Brooms can review results and give recs without appt. Pt didn't want to reschedule until she knows she has insurance coverage, pt unsure of when this will be.   Dr. Tonia Brooms please advise. Thanks.

## 2019-11-04 NOTE — Telephone Encounter (Signed)
Spoke with the pt  She states returning call about samples  Per last phone note will leave a breo sample while she works out her insurance issue  1 sample up front for pick up

## 2019-11-04 NOTE — Telephone Encounter (Signed)
Yes, she can continue her inhalers. Can we get her setup in the community health and wellness? Or maybe [facility] financial assistance? We can also help with he inhalers with samples pending supplies Thanks Josephine Igo, DO [facility] 11/04/2019 2:17 PM

## 2019-11-04 NOTE — Telephone Encounter (Signed)
Dr Tonia Brooms, Sorry but the patient is asking if you are going to prescribe any maintenance inhalers based off her recent PFT.  The only pulmonary meds she is on currently is an albuterol HFA and neb.  Please advise, thank you.

## 2019-11-04 NOTE — Telephone Encounter (Signed)
ATC pt, no answer. Left message for pt to call back.  

## 2019-11-05 ENCOUNTER — Other Ambulatory Visit: Payer: Self-pay

## 2019-11-05 ENCOUNTER — Ambulatory Visit (INDEPENDENT_AMBULATORY_CARE_PROVIDER_SITE_OTHER): Payer: No Typology Code available for payment source | Admitting: Family Medicine

## 2019-11-05 ENCOUNTER — Encounter: Payer: Self-pay | Admitting: Family Medicine

## 2019-11-05 VITALS — BP 156/58 | HR 109 | Temp 98.1°F | Ht 64.0 in | Wt 287.2 lb

## 2019-11-05 DIAGNOSIS — R7303 Prediabetes: Secondary | ICD-10-CM

## 2019-11-05 DIAGNOSIS — E119 Type 2 diabetes mellitus without complications: Secondary | ICD-10-CM

## 2019-11-05 DIAGNOSIS — Z09 Encounter for follow-up examination after completed treatment for conditions other than malignant neoplasm: Secondary | ICD-10-CM

## 2019-11-05 DIAGNOSIS — I10 Essential (primary) hypertension: Secondary | ICD-10-CM

## 2019-11-05 DIAGNOSIS — D649 Anemia, unspecified: Secondary | ICD-10-CM

## 2019-11-05 DIAGNOSIS — Z794 Long term (current) use of insulin: Secondary | ICD-10-CM

## 2019-11-05 LAB — POCT URINALYSIS DIPSTICK
Bilirubin, UA: NEGATIVE
Blood, UA: NEGATIVE
Glucose, UA: POSITIVE — AB
Ketones, UA: NEGATIVE
Leukocytes, UA: NEGATIVE
Nitrite, UA: NEGATIVE
Protein, UA: NEGATIVE
Spec Grav, UA: 1.02 (ref 1.010–1.025)
Urobilinogen, UA: 0.2 E.U./dL
pH, UA: 6 (ref 5.0–8.0)

## 2019-11-05 LAB — POCT GLYCOSYLATED HEMOGLOBIN (HGB A1C): Hemoglobin A1C: 8.1 % — AB (ref 4.0–5.6)

## 2019-11-05 NOTE — Progress Notes (Signed)
Patient [facility]   Established Patient Office Visit   Subjective:  Patient ID: Jill Shaw, female    DOB: [birth year]  Age: 44 y.o. MRN: [id number]  CC:  Chief Complaint  Patient presents with  . Follow-up    DM    HPI Jill Shaw is a 44 year old female who presents for Hospital Follow Up today.   Past Medical History:  Diagnosis Date  . Anemia   . Arthritis    knees, hands  . Asthma   . COPD (chronic obstructive pulmonary disease) ([place])   . Diabetes mellitus without complication ([place])    type 2  . Dysfunctional uterine bleeding   . GERD (gastroesophageal reflux disease)   . Hypertension   . Neuromuscular disorder (HCC)    neuropathy feet  . Seizures ([place]) 09/12/2017   pt states r/t stress and blood sugar - no meds last one 4 months ago, not seen neurologist  . Sickle cell trait ([place])   . Smoker   . Vitamin D deficiency 10/2019  . Wears glasses    Current Status: This will be [name]'s  initial office visit with me. She currently has been seeing Lanae Boast, NP for her PCP needs. Since her last office visit, she is doing well with no complaints. She recently had D&C on 08/20/2019, but states that she continues to have increased vaginal bleeding. She is having increased fatigue. She has follow up with OBGYN 11/11/2019. She continues to follow up with Pulmonology for COPD. She has c/o shortness of breath occasionally. She is beginning process of attending [facility], but she is currently awaiting for Medicaid benefits. She has been having left eye discomfort X 1 year now. She has not been able to follow up with Optometry because of insurance problems. She denies fatigue, frequent urination, blurred vision, excessive hunger, excessive thirst, weight gain, weight loss, and poor wound healing. She continues to check her feet regularly. Denies chest pain, cough, shortness of breath, heart palpitations, and falls.  She has occasional headaches and dizziness with position changes. Denies severe headaches, confusion, seizures, double vision, and blurred vision, nausea and vomiting. She denies fevers, chills, recent infections, weight loss, and night sweats. No reports of GI problems such as  diarrhea, and constipation. She has no reports of blood in stools, dysuria and hematuria. No depression or anxiety reported. She denies suicidal ideations, homicidal ideations, or auditory hallucinations.   Past Surgical History:  Procedure Laterality Date  . CESAREAN SECTION     x 1  . DILATION AND CURETTAGE OF UTERUS N/A 08/20/2019   Procedure: DILATATION AND CURETTAGE;  Surgeon: Emily Filbert, MD;  Location: [facility];  Service: Gynecology;  Laterality: N/A;  . ENDOMETRIAL ABLATION N/A 08/20/2019   Procedure: [place] Ablation;  Surgeon: Emily Filbert, MD;  Location: [facility];  Service: Gynecology;  Laterality: N/A;  . EYE SURGERY Bilateral    laser right and cataract removed left eye  . RADIOLOGY WITH ANESTHESIA N/A 09/16/2019   Procedure: MRI WITH ANESTHESIA   L SPINE WITHOUT CONTRAST, T SPINE WITHOUT CONTRAST , CERVICAL WITHOUT CONTRAST;  Surgeon: Radiologist, Medication, MD;  Location: [facility];  Service: Radiology;  Laterality: N/A;  . TUBAL LIGATION    . UPPER GI ENDOSCOPY  07/2017    Family History  Problem Relation Age of Onset  . Diabetes Mother   . Hypertension Mother     Social History   Socioeconomic History  .  Marital status: Legally Separated    Spouse name: Not on file  . Number of children: Not on file  . Years of education: Not on file  . Highest education level: Not on file  Occupational History  . Not on file  Tobacco Use  . Smoking status: Current Every Day Smoker    Packs/day: 0.50    Years: 15.00    Pack years: 7.50    Types: Cigarettes  . Smokeless tobacco: Never Used  Substance and Sexual Activity  . Alcohol use: No  . Drug use: No  . Sexual activity: Not Currently    Birth  control/protection: None  Other Topics Concern  . Not on file  Social History Narrative  . Not on file   Social Determinants of Health   Financial Resource Strain:   . Difficulty of Paying Living Expenses: Not on file  Food Insecurity:   . Worried About Charity fundraiser in the Last Year: Not on file  . Ran Out of Food in the Last Year: Not on file  Transportation Needs:   . Lack of Transportation (Medical): Not on file  . Lack of Transportation (Non-Medical): Not on file  Physical Activity:   . Days of Exercise per Week: Not on file  . Minutes of Exercise per Session: Not on file  Stress:   . Feeling of Stress : Not on file  Social Connections:   . Frequency of Communication with Friends and Family: Not on file  . Frequency of Social Gatherings with Friends and Family: Not on file  . Attends Religious Services: Not on file  . Active Member of Clubs or Organizations: Not on file  . Attends Archivist Meetings: Not on file  . Marital Status: Not on file  Intimate Partner Violence:   . Fear of Current or Ex-Partner: Not on file  . Emotionally Abused: Not on file  . Physically Abused: Not on file  . Sexually Abused: Not on file    Outpatient Medications Prior to Visit  Medication Sig Dispense Refill  . acetaminophen (TYLENOL 8 HOUR) 650 MG CR tablet Take 1 tablet (650 mg total) by mouth every 8 (eight) hours as needed for pain. 30 tablet 1  . albuterol (PROVENTIL) (2.5 MG/3ML) 0.083% nebulizer solution Take 3 mLs (2.5 mg total) by nebulization every 6 (six) hours as needed for wheezing or shortness of breath. 150 mL 1  . Blood Glucose Monitoring Suppl (TRUE METRIX METER) w/Device KIT 1 each by Does not apply route 4 (four) times daily -  before meals and at bedtime. 1 kit 0  . cetirizine (ZYRTEC) 10 MG tablet Take 1 tablet (10 mg total) by mouth daily. 30 tablet 11  . Cholecalciferol (VITAMIN D-3) 125 MCG (5000 UT) TABS Take 1 tablet by mouth daily. (Patient taking  differently: Take 5,000 Units by mouth daily. ) 90 tablet 3  . cyclobenzaprine (FLEXERIL) 5 MG tablet TAKE 1 TABLET (5 MG TOTAL) BY MOUTH 3 (THREE) TIMES DAILY AS NEEDED FOR MUSCLE SPASMS. 30 tablet 1  . diclofenac sodium (VOLTAREN) 1 % GEL Apply 4 g topically 4 (four) times daily as needed. 500 g 6  . ferrous sulfate 325 (65 FE) MG tablet Take 1 tablet (325 mg total) by mouth 3 (three) times daily with meals. 90 tablet 0  . fluticasone (FLONASE) 50 MCG/ACT nasal spray Place 2 sprays into both nostrils daily. 16 g 6  . fluticasone furoate-vilanterol (BREO ELLIPTA) 200-25 MCG/INH AEPB Inhale 1 puff  into the lungs daily. 14 each 0  . furosemide (LASIX) 80 MG tablet Take 1 tablet (80 mg total) by mouth daily. 90 tablet 1  . Glucosamine Sulfate 1000 MG CAPS Take 1 capsule (1,000 mg total) by mouth 2 (two) times daily. 180 capsule 3  . glucose blood (TRUE METRIX BLOOD GLUCOSE TEST) test strip Use as instructed 100 each 12  . hydrocortisone 2.5 % cream Apply topically 2 (two) times daily. 30 g 2  . Insulin Lispro Prot & Lispro (HUMALOG MIX 75/25 KWIKPEN) (75-25) 100 UNIT/ML Kwikpen INJECT 100 UNITS EVERY 12 HOURS 15 mL 11  . Insulin Pen Needle (PEN NEEDLES) 30G X 5 MM MISC 1 Units by Does not apply route 2 (two) times a day. 100 each 1  . losartan (COZAAR) 100 MG tablet Take 1 tablet (100 mg total) by mouth daily. 90 tablet 3  . metoprolol succinate (TOPROL-XL) 25 MG 24 hr tablet Take 1 tablet (25 mg total) by mouth daily. 30 tablet 5  . omeprazole (PRILOSEC) 20 MG capsule TAKE 2 CAPSULES BY MOUTH DAILY 60 capsule 3  . omeprazole (PRILOSEC) 40 MG capsule Take 1 capsule (40 mg total) by mouth daily. 30 capsule 3  . potassium chloride SA (K-DUR) 20 MEQ tablet Take 1 tablet (20 mEq total) by mouth daily. 30 tablet 3  . saxagliptin HCl (ONGLYZA) 2.5 MG TABS tablet Take 1 tablet (2.5 mg total) by mouth daily. 90 tablet 3  . SYMBICORT 160-4.5 MCG/ACT inhaler INHALE 2 PUFFS INTO THE LUNGS 2 (TWO) TIMES DAILY.  10.2 g 3  . Turmeric 500 MG CAPS Take 500 mg by mouth 2 (two) times daily. 180 capsule 3  . albuterol (PROVENTIL HFA;VENTOLIN HFA) 108 (90 Base) MCG/ACT inhaler Inhale 2 puffs into the lungs every 6 (six) hours as needed for wheezing or shortness of breath. 1 Inhaler 0   No facility-administered medications prior to visit.    Allergies  Allergen Reactions  . Ketoprofen Nausea And Vomiting  . Aspirin Nausea Only  . Gabapentin Nausea And Vomiting    upset stomach  . Ibuprofen Nausea And Vomiting  . Liraglutide Nausea And Vomiting  . Naproxen Nausea And Vomiting  . Omeprazole-Sodium Bicarbonate Nausea And Vomiting  . Sulfa Antibiotics Nausea And Vomiting  . Tramadol Nausea And Vomiting    stomach upset    ROS Review of Systems  Constitutional: Negative.   HENT: Negative.   Eyes: Negative.   Respiratory: Negative.   Cardiovascular: Negative.   Gastrointestinal: Positive for abdominal distention.  Endocrine: Negative.   Genitourinary: Negative.   Musculoskeletal: Positive for arthralgias (generalized ) and back pain (chronic).  Skin: Negative.   Allergic/Immunologic: Negative.   Neurological: Positive for dizziness (occasional) and headaches (occasional).  Hematological: Negative.   Psychiatric/Behavioral: Negative.       Objective:    Physical Exam  Constitutional: She is oriented to person, place, and time. She appears well-developed and well-nourished.  HENT:  Head: Normocephalic and atraumatic.  Eyes: Conjunctivae are normal.  Cardiovascular: Normal rate, regular rhythm, normal heart sounds and intact distal pulses.  Pulmonary/Chest: Effort normal and breath sounds normal.  Abdominal: Soft. Bowel sounds are normal. She exhibits distension (obese).  Musculoskeletal:        General: Normal range of motion.     Cervical back: Normal range of motion and neck supple.  Neurological: She is alert and oriented to person, place, and time. She has normal reflexes.  Skin:  Skin is warm and dry.  Psychiatric: Her  behavior is normal. Judgment and thought content normal.  Nursing note and vitals reviewed.   BP (!) 156/58   Pulse (!) 109   Temp 98.1 F (36.7 C) (Oral)   Ht '5\' 4"'$  (1.626 m)   Wt 287 lb 3.2 oz (130.3 kg)   LMP 10/12/2019   SpO2 99%   BMI 49.30 kg/m  Wt Readings from Last 3 Encounters:  11/05/19 287 lb 3.2 oz (130.3 kg)  09/16/19 270 lb (122.5 kg)  08/29/19 282 lb (127.9 kg)     Health Maintenance Due  Topic Date Due  . FOOT EXAM  09/22/2017  . OPHTHALMOLOGY EXAM  11/16/2017    There are no preventive care reminders to display for this patient.  Lab Results  Component Value Date   TSH 3.100 11/05/2019   Lab Results  Component Value Date   WBC 12.4 (H) 11/05/2019   HGB 9.4 (L) 11/05/2019   HCT 31.4 (L) 11/05/2019   MCV 74 (L) 11/05/2019   PLT 319 11/05/2019   Lab Results  Component Value Date   NA 137 11/05/2019   K 4.6 11/05/2019   CO2 23 11/05/2019   GLUCOSE 196 (H) 11/05/2019   BUN 14 11/05/2019   CREATININE 0.97 11/05/2019   BILITOT 0.3 11/05/2019   ALKPHOS 123 (H) 11/05/2019   AST 93 (H) 11/05/2019   ALT 25 11/05/2019   PROT 7.3 11/05/2019   ALBUMIN 4.3 11/05/2019   CALCIUM 10.0 11/05/2019   ANIONGAP 16 (H) 09/16/2019   Lab Results  Component Value Date   CHOL 151 11/05/2019   Lab Results  Component Value Date   HDL 55 11/05/2019   Lab Results  Component Value Date   LDLCALC 63 11/05/2019   Lab Results  Component Value Date   TRIG 205 (H) 11/05/2019   Lab Results  Component Value Date   CHOLHDL 2.7 11/05/2019   Lab Results  Component Value Date   HGBA1C 8.1 (A) 11/05/2019      Assessment & Plan:   1. Hospital discharge follow-up  2. Type 2 diabetes mellitus without complication, with long-term current use of insulin ([place]) She will continue medication as prescribed, to decrease foods/beverages high in sugars and carbs and follow Heart Healthy or DASH diet. Increase physical activity  to at least 30 minutes cardio exercise daily.  - POCT urinalysis dipstick - POCT glycosylated hemoglobin (Hb A1C) - Comprehensive metabolic panel - Lipid Panel - Vitamin B12 - Vitamin D, 25-hydroxy - TSH  3. Essential hypertension Blood pressure is stable. She will continue to take medications as prescribed, to decrease high sodium intake, excessive alcohol intake, increase potassium intake, smoking cessation, and increase physical activity of at least 30 minutes of cardio activity daily. She will continue to follow Heart Healthy or DASH diet. - CBC with Differential - Comprehensive metabolic panel - Lipid Panel - Vitamin B12 - Vitamin D, 25-hydroxy - TSH  4. Anemia, unspecified type - CBC with Differential  5. Hemoglobin A1C between 7% and 9% indicating borderline diabetic control Hgb A1c at 8.1 today.   6. Follow up She will follow up with [place] Hollis, NP  No orders of the defined types were placed in this encounter.  Orders Placed This Encounter  Procedures  . CBC with Differential  . Comprehensive metabolic panel  . Lipid Panel  . Vitamin B12  . Vitamin D, 25-hydroxy  . TSH  . POCT urinalysis dipstick  . POCT glycosylated hemoglobin (Hb A1C)    Referral Orders  No  referral(s) requested today    Kathe Becton,  MSN, FNP-BC [facility] [address] [place], [place] [address] [phone] fax    Problem List Items Addressed This Visit      Cardiovascular and Mediastinum   Essential hypertension   Relevant Orders   CBC with Differential (Completed)   Comprehensive metabolic panel (Completed)   Lipid Panel (Completed)   Vitamin B12 (Completed)   Vitamin D, 25-hydroxy (Completed)   TSH (Completed)     Endocrine   Type 2 diabetes mellitus without complication, with long-term current use of insulin (H[facility])   Relevant Orders   POCT urinalysis dipstick (Completed)   POCT  glycosylated hemoglobin (Hb A1C) (Completed)   Comprehensive metabolic panel (Completed)   Lipid Panel (Completed)   Vitamin B12 (Completed)   Vitamin D, 25-hydroxy (Completed)   TSH (Completed)    Other Visit Diagnoses    Hospital discharge follow-up    -  Primary   Anemia, unspecified type       Relevant Orders   CBC with Differential (Completed)   Hemoglobin A1C between 7% and 9% indicating borderline diabetic control       Follow up          No orders of the defined types were placed in this encounter.   Follow-up: No follow-ups on file.    Azzie Glatter, FNP

## 2019-11-06 ENCOUNTER — Encounter: Payer: Self-pay | Admitting: Family Medicine

## 2019-11-06 ENCOUNTER — Other Ambulatory Visit: Payer: Self-pay | Admitting: Family Medicine

## 2019-11-06 DIAGNOSIS — J42 Unspecified chronic bronchitis: Secondary | ICD-10-CM

## 2019-11-06 DIAGNOSIS — E559 Vitamin D deficiency, unspecified: Secondary | ICD-10-CM

## 2019-11-06 LAB — CBC WITH DIFFERENTIAL/PLATELET
Basophils Absolute: 0.1 10*3/uL (ref 0.0–0.2)
Basos: 1 %
EOS (ABSOLUTE): 0.3 10*3/uL (ref 0.0–0.4)
Eos: 2 %
Hematocrit: 31.4 % — ABNORMAL LOW (ref 34.0–46.6)
Hemoglobin: 9.4 g/dL — ABNORMAL LOW (ref 11.1–15.9)
Immature Grans (Abs): 0.1 10*3/uL (ref 0.0–0.1)
Immature Granulocytes: 1 %
Lymphocytes Absolute: 2.2 10*3/uL (ref 0.7–3.1)
Lymphs: 18 %
MCH: 22.1 pg — ABNORMAL LOW (ref 26.6–33.0)
MCHC: 29.9 g/dL — ABNORMAL LOW (ref 31.5–35.7)
MCV: 74 fL — ABNORMAL LOW (ref 79–97)
Monocytes Absolute: 0.8 10*3/uL (ref 0.1–0.9)
Monocytes: 7 %
Neutrophils Absolute: 9 10*3/uL — ABNORMAL HIGH (ref 1.4–7.0)
Neutrophils: 71 %
Platelets: 319 10*3/uL (ref 150–450)
RBC: 4.25 x10E6/uL (ref 3.77–5.28)
RDW: 21.2 % — ABNORMAL HIGH (ref 11.7–15.4)
WBC: 12.4 10*3/uL — ABNORMAL HIGH (ref 3.4–10.8)

## 2019-11-06 LAB — COMPREHENSIVE METABOLIC PANEL
ALT: 25 IU/L (ref 0–32)
AST: 93 IU/L — ABNORMAL HIGH (ref 0–40)
Albumin/Globulin Ratio: 1.4 (ref 1.2–2.2)
Albumin: 4.3 g/dL (ref 3.8–4.8)
Alkaline Phosphatase: 123 IU/L — ABNORMAL HIGH (ref 39–117)
BUN/Creatinine Ratio: 14 (ref 9–23)
BUN: 14 mg/dL (ref 6–24)
Bilirubin Total: 0.3 mg/dL (ref 0.0–1.2)
CO2: 23 mmol/L (ref 20–29)
Calcium: 10 mg/dL (ref 8.7–10.2)
Chloride: 99 mmol/L (ref 96–106)
Creatinine, Ser: 0.97 mg/dL (ref 0.57–1.00)
GFR calc Af Amer: 83 mL/min/{1.73_m2} (ref >59)
GFR calc non Af Amer: 72 mL/min/{1.73_m2} (ref >59)
Globulin, Total: 3 g/dL (ref 1.5–4.5)
Glucose: 196 mg/dL — ABNORMAL HIGH (ref 65–99)
Potassium: 4.6 mmol/L (ref 3.5–5.2)
Sodium: 137 mmol/L (ref 134–144)
Total Protein: 7.3 g/dL (ref 6.0–8.5)

## 2019-11-06 LAB — LIPID PANEL
Chol/HDL Ratio: 2.7 ratio (ref 0.0–4.4)
Cholesterol, Total: 151 mg/dL (ref 100–199)
HDL: 55 mg/dL (ref >39)
LDL Chol Calc (NIH): 63 mg/dL (ref 0–99)
Triglycerides: 205 mg/dL — ABNORMAL HIGH (ref 0–149)
VLDL Cholesterol Cal: 33 mg/dL (ref 5–40)

## 2019-11-06 LAB — VITAMIN B12: Vitamin B-12: 535 pg/mL (ref 232–1245)

## 2019-11-06 LAB — VITAMIN D 25 HYDROXY (VIT D DEFICIENCY, FRACTURES): Vit D, 25-Hydroxy: 7.6 ng/mL — ABNORMAL LOW (ref 30.0–100.0)

## 2019-11-06 LAB — TSH: TSH: 3.1 u[IU]/mL (ref 0.450–4.500)

## 2019-11-06 MED ORDER — VITAMIN D (ERGOCALCIFEROL) 1.25 MG (50000 UNIT) PO CAPS: 50000.0000 [IU] | ORAL_CAPSULE | ORAL | 6 refills | Status: DC

## 2019-11-06 MED ORDER — ALBUTEROL SULFATE HFA 108 (90 BASE) MCG/ACT IN AERS: 2.0000 | INHALATION_SPRAY | Freq: Four times a day (QID) | RESPIRATORY_TRACT | 11 refills | Status: DC | PRN

## 2019-11-06 NOTE — Progress Notes (Unsigned)
vit

## 2019-11-10 MED FILL — ?HUMALOG MIX 75-25 KWIKPEN: (75-25) 100 | 21 days supply | Qty: 45 | Fill #1

## 2019-11-11 ENCOUNTER — Ambulatory Visit: Payer: Self-pay | Admitting: Obstetrics & Gynecology

## 2019-11-13 MED FILL — TRUEPLUS 5-BEVEL PEN NEEDLE: 31G X 5 MM | 30 days supply | Qty: 100 | Fill #0

## 2019-11-18 ENCOUNTER — Other Ambulatory Visit: Payer: Self-pay | Admitting: Family Medicine

## 2019-11-18 DIAGNOSIS — I10 Essential (primary) hypertension: Secondary | ICD-10-CM

## 2019-11-18 MED FILL — ALBUTEROL SUL 2.5 MG/3 ML S: (2.5 MG/3ML | 12 days supply | Qty: 150 | Fill #0

## 2019-11-18 MED FILL — ONGLYZA 2.5 MG TABLET: 2.5 | 30 days supply | Qty: 30 | Fill #2

## 2019-11-18 MED FILL — !SYMBICORT 160-4.5 MCG INH: 160-4.5 | 30 days supply | Qty: 1 | Fill #0

## 2019-11-18 MED FILL — METOPROLOL SUCCINATE ER 25: 25 | 30 days supply | Qty: 30 | Fill #5

## 2019-11-18 MED FILL — ?OMEPRAZOLE 20MG CAP DR: 20 | 30 days supply | Qty: 60 | Fill #1

## 2019-11-19 ENCOUNTER — Ambulatory Visit: Payer: No Typology Code available for payment source | Admitting: Pulmonary Disease

## 2019-11-19 ENCOUNTER — Telehealth: Payer: Self-pay | Admitting: Pulmonary Disease

## 2019-11-19 NOTE — Telephone Encounter (Signed)
PFT was performed 10/05/19. Pt's OV with Dr. Tonia Brooms which was scheduled 12/31 was cancelled by pt and was rescheduled for today 2/3 and that appt was also cancelled by pt.  Pt needs to have a f/u as that f/u appt was for Dr. Tonia Brooms to discuss results of PFT and decide next steps.  Attempted to call pt but unable to reach. Left message for pt to return call.

## 2019-11-20 NOTE — Telephone Encounter (Signed)
atc patient unable to reach patient left message to call back needs appt with Dr. Tonia Brooms to go over PFT results

## 2019-11-21 ENCOUNTER — Ambulatory Visit: Payer: No Typology Code available for payment source | Admitting: Nurse Practitioner

## 2019-11-21 ENCOUNTER — Other Ambulatory Visit: Payer: Self-pay | Admitting: Family Medicine

## 2019-11-21 DIAGNOSIS — M62838 Other muscle spasm: Secondary | ICD-10-CM

## 2019-11-21 MED FILL — HYDROCORTISONE 2.5% CREAM: 2.5 | 15 days supply | Qty: 30 | Fill #1

## 2019-11-21 MED FILL — CYCLOBENZAPRINE 5 MG TABLET: 5 | 10 days supply | Qty: 30 | Fill #0

## 2019-11-21 NOTE — Telephone Encounter (Signed)
ATC pt, no answer. Left message for pt to call back.  

## 2019-11-24 ENCOUNTER — Other Ambulatory Visit: Payer: Self-pay

## 2019-11-24 ENCOUNTER — Telehealth: Payer: Self-pay

## 2019-11-24 ENCOUNTER — Ambulatory Visit (INDEPENDENT_AMBULATORY_CARE_PROVIDER_SITE_OTHER): Payer: No Typology Code available for payment source | Admitting: Nurse Practitioner

## 2019-11-24 DIAGNOSIS — I10 Essential (primary) hypertension: Secondary | ICD-10-CM

## 2019-11-24 DIAGNOSIS — R0981 Nasal congestion: Secondary | ICD-10-CM

## 2019-11-24 DIAGNOSIS — M25511 Pain in right shoulder: Secondary | ICD-10-CM

## 2019-11-24 DIAGNOSIS — J410 Simple chronic bronchitis: Secondary | ICD-10-CM

## 2019-11-24 DIAGNOSIS — R700 Elevated erythrocyte sedimentation rate: Secondary | ICD-10-CM

## 2019-11-24 DIAGNOSIS — G629 Polyneuropathy, unspecified: Secondary | ICD-10-CM

## 2019-11-24 DIAGNOSIS — R7982 Elevated C-reactive protein (CRP): Secondary | ICD-10-CM

## 2019-11-24 DIAGNOSIS — R609 Edema, unspecified: Secondary | ICD-10-CM

## 2019-11-24 DIAGNOSIS — M25512 Pain in left shoulder: Secondary | ICD-10-CM

## 2019-11-24 MED ORDER — FLUTICASONE PROPIONATE 50 MCG/ACT NA SUSP: 2.0000 | Freq: Every day | NASAL | 6 refills | Status: DC

## 2019-11-24 MED ORDER — LIDOCAINE-CAPSAICIN 4-0.1 % EX CREA: 1.0000 "application " | TOPICAL_CREAM | Freq: Four times a day (QID) | CUTANEOUS | 2 refills | Status: AC | PRN

## 2019-11-24 MED ORDER — FUROSEMIDE 80 MG PO TABS: 80.0000 mg | ORAL_TABLET | Freq: Every day | ORAL | 1 refills | Status: DC

## 2019-11-24 MED ORDER — METOPROLOL SUCCINATE ER 50 MG PO TB24: 50.0000 mg | ORAL_TABLET | Freq: Every day | ORAL | 2 refills | Status: DC

## 2019-11-24 MED ORDER — TIZANIDINE HCL 6 MG PO CAPS: 6.0000 mg | ORAL_CAPSULE | Freq: Three times a day (TID) | ORAL | 0 refills | Status: DC

## 2019-11-24 MED FILL — FLUTICASONE PROP 50 MCG SPR: 50 | 30 days supply | Qty: 16 | Fill #0

## 2019-11-24 MED FILL — ?FUROSEMIDE 80MG TABLET: 80 | 30 days supply | Qty: 30 | Fill #0

## 2019-11-24 MED FILL — TIZANIDINE HCL 6 MG CAPS: 6 | 30 days supply | Qty: 90 | Fill #0

## 2019-11-24 NOTE — Telephone Encounter (Signed)
error 

## 2019-11-24 NOTE — Progress Notes (Signed)
Established Patient Office Visit  Subjective:  Patient ID: Jill Shaw, female    DOB: 1976/01/23  Age: 44 y.o. MRN: [id number]  CC:  Chief Complaint  Patient presents with  . Follow-up  . Medication Management    wants to discuss changing muscle relaxer   . Joint Pain    HPI Jill Shaw presents for follow-up.  She was recently seen for normal visit.  She admits that she is concerned about her heart rate.  She does not feel like the current metoprolol 25 mg daily is effective.  She feels like her heart continues to race.  Today her blood pressure is elevated at 157/67. She is also concerned about her upper arm and lower back pain.  She rates her pain a 10 out of 10.  She is having stiffness and tightness in both her joints and muscles.  She feels like sometimes she is holding onto fluid.  She is currently on Lasix 80 mg daily.  She is not monitoring her weight regularly.  She admits that she does have some shortness of breath and wheezing when she feels extra tight.  She is taking cyclobenzaprine.  She does not feel like this is effective.  She was on tizanidine 6 mg in the past.  She would like to restart this.  She is not saying that it was great but it was more effective than the Flexeril. Denies headache, dizziness, visual changes,  chest pain, nausea or vomiting .     Past Medical History:  Diagnosis Date  . Anemia   . Arthritis    knees, hands  . Asthma   . COPD (chronic obstructive pulmonary disease) ([place])   . Diabetes mellitus without complication ([place])    type 2  . Dysfunctional uterine bleeding   . GERD (gastroesophageal reflux disease)   . Hypertension   . Neuromuscular disorder (HCC)    neuropathy feet  . Seizures ([place]) 09/12/2017   pt states r/t stress and blood sugar - no meds last one 4 months ago, not seen neurologist  . Sickle cell trait ([place])   . Smoker   . Vitamin D deficiency 10/2019  . Wears glasses     Past Surgical History:  Procedure  Laterality Date  . CESAREAN SECTION     x 1  . DILATION AND CURETTAGE OF UTERUS N/A 08/20/2019   Procedure: DILATATION AND CURETTAGE;  Surgeon: Emily Filbert, MD;  Location: [facility];  Service: Gynecology;  Laterality: N/A;  . ENDOMETRIAL ABLATION N/A 08/20/2019   Procedure: [place] Ablation;  Surgeon: Emily Filbert, MD;  Location: [facility];  Service: Gynecology;  Laterality: N/A;  . EYE SURGERY Bilateral    laser right and cataract removed left eye  . RADIOLOGY WITH ANESTHESIA N/A 09/16/2019   Procedure: MRI WITH ANESTHESIA   L SPINE WITHOUT CONTRAST, T SPINE WITHOUT CONTRAST , CERVICAL WITHOUT CONTRAST;  Surgeon: Radiologist, Medication, MD;  Location: [facility];  Service: Radiology;  Laterality: N/A;  . TUBAL LIGATION    . UPPER GI ENDOSCOPY  07/2017    Family History  Problem Relation Age of Onset  . Diabetes Mother   . Hypertension Mother     Social History   Socioeconomic History  . Marital status: Legally Separated    Spouse name: Not on file  . Number of children: Not on file  . Years of education: Not on file  . Highest education level: Not on file  Occupational History  . Not  on file  Tobacco Use  . Smoking status: Current Every Day Smoker    Packs/day: 0.50    Years: 15.00    Pack years: 7.50    Types: Cigarettes  . Smokeless tobacco: Never Used  Substance and Sexual Activity  . Alcohol use: No  . Drug use: No  . Sexual activity: Not Currently    Birth control/protection: None  Other Topics Concern  . Not on file  Social History Narrative  . Not on file   Social Determinants of Health   Financial Resource Strain:   . Difficulty of Paying Living Expenses: Not on file  Food Insecurity:   . Worried About Charity fundraiser in the Last Year: Not on file  . Ran Out of Food in the Last Year: Not on file  Transportation Needs:   . Lack of Transportation (Medical): Not on file  . Lack of Transportation (Non-Medical): Not on file  Physical Activity:   . Days of Exercise  per Week: Not on file  . Minutes of Exercise per Session: Not on file  Stress:   . Feeling of Stress : Not on file  Social Connections:   . Frequency of Communication with Friends and Family: Not on file  . Frequency of Social Gatherings with Friends and Family: Not on file  . Attends Religious Services: Not on file  . Active Member of Clubs or Organizations: Not on file  . Attends Archivist Meetings: Not on file  . Marital Status: Not on file  Intimate Partner Violence:   . Fear of Current or Ex-Partner: Not on file  . Emotionally Abused: Not on file  . Physically Abused: Not on file  . Sexually Abused: Not on file    Outpatient Medications Prior to Visit  Medication Sig Dispense Refill  . acetaminophen (TYLENOL 8 HOUR) 650 MG CR tablet Take 1 tablet (650 mg total) by mouth every 8 (eight) hours as needed for pain. 30 tablet 1  . albuterol (PROVENTIL) (2.5 MG/3ML) 0.083% nebulizer solution Take 3 mLs (2.5 mg total) by nebulization every 6 (six) hours as needed for wheezing or shortness of breath. 150 mL 1  . albuterol (VENTOLIN HFA) 108 (90 Base) MCG/ACT inhaler Inhale 2 puffs into the lungs every 6 (six) hours as needed for wheezing or shortness of breath. 8 g 11  . Blood Glucose Monitoring Suppl (TRUE METRIX METER) w/Device KIT 1 each by Does not apply route 4 (four) times daily -  before meals and at bedtime. 1 kit 0  . cetirizine (ZYRTEC) 10 MG tablet Take 1 tablet (10 mg total) by mouth daily. 30 tablet 11  . Cholecalciferol (VITAMIN D-3) 125 MCG (5000 UT) TABS Take 1 tablet by mouth daily. (Patient taking differently: Take 5,000 Units by mouth daily. ) 90 tablet 3  . ferrous sulfate 325 (65 FE) MG tablet Take 1 tablet (325 mg total) by mouth 3 (three) times daily with meals. 90 tablet 0  . fluticasone furoate-vilanterol (BREO ELLIPTA) 200-25 MCG/INH AEPB Inhale 1 puff into the lungs daily. 14 each 0  . Glucosamine Sulfate 1000 MG CAPS Take 1 capsule (1,000 mg total) by  mouth 2 (two) times daily. 180 capsule 3  . glucose blood (TRUE METRIX BLOOD GLUCOSE TEST) test strip Use as instructed 100 each 12  . hydrocortisone 2.5 % cream Apply topically 2 (two) times daily. 30 g 2  . Insulin Lispro Prot & Lispro (HUMALOG MIX 75/25 KWIKPEN) (75-25) 100 UNIT/ML [employer]  100 UNITS EVERY 12 HOURS 15 mL 11  . Insulin Pen Needle (PEN NEEDLES) 30G X 5 MM MISC 1 Units by Does not apply route 2 (two) times a day. 100 each 1  . losartan (COZAAR) 100 MG tablet TAKE 1 TABLET (100 MG TOTAL) BY MOUTH DAILY. 30 tablet 3  . omeprazole (PRILOSEC) 20 MG capsule TAKE 2 CAPSULES BY MOUTH DAILY 60 capsule 3  . omeprazole (PRILOSEC) 40 MG capsule Take 1 capsule (40 mg total) by mouth daily. 30 capsule 3  . potassium chloride SA (K-DUR) 20 MEQ tablet Take 1 tablet (20 mEq total) by mouth daily. 30 tablet 3  . saxagliptin HCl (ONGLYZA) 2.5 MG TABS tablet Take 1 tablet (2.5 mg total) by mouth daily. 90 tablet 3  . SYMBICORT 160-4.5 MCG/ACT inhaler INHALE 2 PUFFS INTO THE LUNGS 2 (TWO) TIMES DAILY. 10.2 g 3  . Turmeric 500 MG CAPS Take 500 mg by mouth 2 (two) times daily. 180 capsule 3  . Vitamin D, Ergocalciferol, (DRISDOL) 1.25 MG (50000 UNIT) CAPS capsule Take 1 capsule (50,000 Units total) by mouth every 7 (seven) days. 5 capsule 6  . cyclobenzaprine (FLEXERIL) 5 MG tablet TAKE 1 TABLET (5 MG TOTAL) BY MOUTH 3 (THREE) TIMES DAILY AS NEEDED FOR MUSCLE SPASMS. 30 tablet 1  . diclofenac sodium (VOLTAREN) 1 % GEL Apply 4 g topically 4 (four) times daily as needed. 500 g 6  . fluticasone (FLONASE) 50 MCG/ACT nasal spray Place 2 sprays into both nostrils daily. 16 g 6  . furosemide (LASIX) 80 MG tablet Take 1 tablet (80 mg total) by mouth daily. 90 tablet 1  . metoprolol succinate (TOPROL-XL) 25 MG 24 hr tablet Take 1 tablet (25 mg total) by mouth daily. 30 tablet 5   No facility-administered medications prior to visit.    Allergies  Allergen Reactions  . Ketoprofen Nausea And Vomiting   . Aspirin Nausea Only  . Gabapentin Nausea And Vomiting    upset stomach  . Ibuprofen Nausea And Vomiting  . Liraglutide Nausea And Vomiting  . Naproxen Nausea And Vomiting  . Omeprazole-Sodium Bicarbonate Nausea And Vomiting  . Sulfa Antibiotics Nausea And Vomiting  . Tramadol Nausea And Vomiting    stomach upset    ROS Review of Systems  Musculoskeletal: Positive for arthralgias.  Skin: Negative.   Neurological: Positive for weakness.      Objective:    Physical Exam  Constitutional: She is oriented to person, place, and time. She appears well-developed.  Morbidly obese  HENT:  Head: Normocephalic.  Cardiovascular: Normal rate, regular rhythm and normal heart sounds.  Pulmonary/Chest: Effort normal.  Coarse breath sounds in the bronchioles  Abdominal:  Obese  Musculoskeletal:        General: Edema present. Normal range of motion.     Cervical back: Normal range of motion.     Comments: 2+ pitting edema lower extremities bilaterally  Neurological: She is alert and oriented to person, place, and time.  Skin: Skin is warm and dry.  Skin tight generalized edema    BP (!) 157/67 (BP Location: Right Arm, Patient Position: Sitting, Cuff Size: Large)   Pulse (!) 102   Temp 98.8 F (37.1 C) (Oral)   Resp 20   Ht '5\' 4"'$  (1.626 m)   Wt 288 lb (130.6 kg)   LMP 11/22/2019   SpO2 100%   BMI 49.44 kg/m  Wt Readings from Last 3 Encounters:  11/24/19 288 lb (130.6 kg)  11/05/19 287 lb 3.2  oz (130.3 kg)  09/16/19 270 lb (122.5 kg)     Health Maintenance Due  Topic Date Due  . FOOT EXAM  09/22/2017  . OPHTHALMOLOGY EXAM  11/16/2017    There are no preventive care reminders to display for this patient.  Lab Results  Component Value Date   TSH 3.100 11/05/2019   Lab Results  Component Value Date   WBC 12.4 (H) 11/05/2019   HGB 9.4 (L) 11/05/2019   HCT 31.4 (L) 11/05/2019   MCV 74 (L) 11/05/2019   PLT 319 11/05/2019   Lab Results  Component Value Date    NA 137 11/05/2019   K 4.6 11/05/2019   CO2 23 11/05/2019   GLUCOSE 196 (H) 11/05/2019   BUN 14 11/05/2019   CREATININE 0.97 11/05/2019   BILITOT 0.3 11/05/2019   ALKPHOS 123 (H) 11/05/2019   AST 93 (H) 11/05/2019   ALT 25 11/05/2019   PROT 7.3 11/05/2019   ALBUMIN 4.3 11/05/2019   CALCIUM 10.0 11/05/2019   ANIONGAP 16 (H) 09/16/2019   Lab Results  Component Value Date   CHOL 151 11/05/2019   Lab Results  Component Value Date   HDL 55 11/05/2019   Lab Results  Component Value Date   LDLCALC 63 11/05/2019   Lab Results  Component Value Date   TRIG 205 (H) 11/05/2019   Lab Results  Component Value Date   CHOLHDL 2.7 11/05/2019   Lab Results  Component Value Date   HGBA1C 8.1 (A) 11/05/2019      Assessment & Plan:   Problem List Items Addressed This Visit      Unprioritized   Chronic obstructive pulmonary disease (HCC)   Relevant Medications   fluticasone (FLONASE) 50 MCG/ACT nasal spray   Essential hypertension   Relevant Medications   furosemide (LASIX) 80 MG tablet   metoprolol succinate (TOPROL XL) 50 MG 24 hr tablet   Morbid obesity (HCC) - Primary   Peripheral edema   Relevant Orders   Brain natriuretic peptide    Other Visit Diagnoses    Neuropathy       Pain of both shoulder joints       Full range of motion   Relevant Orders   Sedimentation rate   C-reactive protein   Nasal congestion       Relevant Medications   fluticasone (FLONASE) 50 MCG/ACT nasal spray      Meds ordered this encounter  Medications  . tizanidine (ZANAFLEX) 6 MG capsule    Sig: Take 1 capsule (6 mg total) by mouth 3 (three) times daily.    Dispense:  270 capsule    Refill:  0    Order Specific Question:   Supervising Provider    Answer:   Tresa Garter [id number]  . fluticasone (FLONASE) 50 MCG/ACT nasal spray    Sig: Place 2 sprays into both nostrils daily.    Dispense:  16 g    Refill:  6    Order Specific Question:   Supervising Provider    Answer:    Tresa Garter [id number]  . furosemide (LASIX) 80 MG tablet    Sig: Take 1 tablet (80 mg total) by mouth daily.    Dispense:  90 tablet    Refill:  1    Order Specific Question:   Supervising Provider    Answer:   Tresa Garter [id number]  . metoprolol succinate (TOPROL XL) 50 MG 24 hr tablet    Sig: Take 1 tablet (50  mg total) by mouth daily. Take with or immediately following a meal.    Dispense:  30 tablet    Refill:  2    Order Specific Question:   Supervising Provider    Answer:   Tresa Garter [id number]  . Lidocaine-Capsaicin 4-0.1 % CREA    Sig: Apply 1 application topically 4 (four) times daily as needed.    Dispense:  30 g    Refill:  2    Order Specific Question:   Supervising Provider    Answer:   Tresa Garter [id number]    Follow-up: No follow-ups on file.    Vevelyn Francois, NP

## 2019-11-24 NOTE — Telephone Encounter (Signed)
We have attempted to contact pt several times with no success or call back from pt. Per triage protocol, message will be closed.  

## 2019-11-26 ENCOUNTER — Other Ambulatory Visit: Payer: Self-pay | Admitting: Nurse Practitioner

## 2019-11-26 ENCOUNTER — Encounter: Payer: Self-pay | Admitting: Nurse Practitioner

## 2019-11-26 ENCOUNTER — Other Ambulatory Visit: Payer: Self-pay

## 2019-11-26 DIAGNOSIS — R700 Elevated erythrocyte sedimentation rate: Secondary | ICD-10-CM

## 2019-11-26 DIAGNOSIS — I10 Essential (primary) hypertension: Secondary | ICD-10-CM

## 2019-11-26 DIAGNOSIS — R7982 Elevated C-reactive protein (CRP): Secondary | ICD-10-CM | POA: Insufficient documentation

## 2019-11-26 MED ORDER — POTASSIUM CHLORIDE CRYS ER 20 MEQ PO TBCR: 20.0000 meq | EXTENDED_RELEASE_TABLET | Freq: Every day | ORAL | 3 refills | Status: DC

## 2019-11-26 MED FILL — POTASSIUM CL ER 20 MEQ TAB: 20 | 30 days supply | Qty: 30 | Fill #0

## 2019-11-26 NOTE — Addendum Note (Signed)
Addended by: Loney Hering on: 11/26/2019 08:40 AM   Modules accepted: Orders

## 2019-11-27 LAB — ANA W/REFLEX IF POSITIVE: Anti Nuclear Antibody (ANA): NEGATIVE

## 2019-11-27 LAB — SPECIMEN STATUS REPORT

## 2019-11-27 LAB — RHEUMATOID FACTOR: Rheumatoid fact SerPl-aCnc: <10 IU/mL (ref 0.0–13.9)

## 2019-11-28 LAB — BRAIN NATRIURETIC PEPTIDE

## 2019-11-28 LAB — C-REACTIVE PROTEIN: CRP: 19 mg/L — ABNORMAL HIGH (ref 0–10)

## 2019-11-28 LAB — SEDIMENTATION RATE: Sed Rate: 98 mm/hr — ABNORMAL HIGH (ref 0–32)

## 2019-12-01 ENCOUNTER — Other Ambulatory Visit: Payer: Self-pay | Admitting: Family Medicine

## 2019-12-01 ENCOUNTER — Telehealth: Payer: Self-pay | Admitting: Nurse Practitioner

## 2019-12-01 DIAGNOSIS — I10 Essential (primary) hypertension: Secondary | ICD-10-CM

## 2019-12-01 MED FILL — LOSARTAN POTASSIUM 100 MG T: 100 | 30 days supply | Qty: 30 | Fill #0

## 2019-12-01 NOTE — Telephone Encounter (Signed)
Called and advised pt of labs indicating inflammation in joints and that we have added on other labs to see if any explanation for this. Advised we will let her know when these are completed.

## 2019-12-02 ENCOUNTER — Other Ambulatory Visit: Payer: Self-pay | Admitting: Family Medicine

## 2019-12-02 DIAGNOSIS — J449 Chronic obstructive pulmonary disease, unspecified: Secondary | ICD-10-CM

## 2019-12-02 DIAGNOSIS — E559 Vitamin D deficiency, unspecified: Secondary | ICD-10-CM

## 2019-12-02 DIAGNOSIS — L309 Dermatitis, unspecified: Secondary | ICD-10-CM

## 2019-12-02 DIAGNOSIS — Z794 Long term (current) use of insulin: Secondary | ICD-10-CM

## 2019-12-02 DIAGNOSIS — I10 Essential (primary) hypertension: Secondary | ICD-10-CM

## 2019-12-02 DIAGNOSIS — E119 Type 2 diabetes mellitus without complications: Secondary | ICD-10-CM

## 2019-12-02 DIAGNOSIS — J42 Unspecified chronic bronchitis: Secondary | ICD-10-CM

## 2019-12-02 MED ORDER — METOPROLOL SUCCINATE ER 50 MG PO TB24: 50.0000 mg | ORAL_TABLET | Freq: Every day | ORAL | 3 refills | Status: DC

## 2019-12-02 MED ORDER — INSULIN LISPRO PROT & LISPRO (75-25 MIX) 100 UNIT/ML KWIKPEN: PEN_INJECTOR | SUBCUTANEOUS | 11 refills | Status: DC

## 2019-12-02 MED ORDER — ONGLYZA 2.5 MG PO TABS: 2.5000 mg | ORAL_TABLET | Freq: Every day | ORAL | 3 refills | Status: DC

## 2019-12-02 MED ORDER — LOSARTAN POTASSIUM 100 MG PO TABS: 100.0000 mg | ORAL_TABLET | Freq: Every day | ORAL | 3 refills | Status: DC

## 2019-12-02 MED ORDER — ALBUTEROL SULFATE (2.5 MG/3ML) 0.083% IN NEBU: 2.5000 mg | INHALATION_SOLUTION | Freq: Four times a day (QID) | RESPIRATORY_TRACT | 3 refills | Status: DC | PRN

## 2019-12-02 MED ORDER — BUDESONIDE-FORMOTEROL FUMARATE 160-4.5 MCG/ACT IN AERO: INHALATION_SPRAY | RESPIRATORY_TRACT | 3 refills | Status: DC

## 2019-12-02 MED ORDER — FERROUS SULFATE 325 (65 FE) MG PO TABS: 325.0000 mg | ORAL_TABLET | Freq: Three times a day (TID) | ORAL | 3 refills | Status: DC

## 2019-12-02 MED ORDER — PEN NEEDLES 30G X 5 MM MISC: 1.0000 [IU] | 11 refills | Status: DC

## 2019-12-02 MED ORDER — CETIRIZINE HCL 10 MG PO TABS: 10.0000 mg | ORAL_TABLET | Freq: Every day | ORAL | 3 refills | Status: DC

## 2019-12-02 MED ORDER — VITAMIN D (ERGOCALCIFEROL) 1.25 MG (50000 UNIT) PO CAPS: 50000.0000 [IU] | ORAL_CAPSULE | ORAL | 6 refills | Status: DC

## 2019-12-02 MED ORDER — OMEPRAZOLE 40 MG PO CPDR: 40.0000 mg | DELAYED_RELEASE_CAPSULE | Freq: Every day | ORAL | 3 refills | Status: DC

## 2019-12-02 MED ORDER — FUROSEMIDE 80 MG PO TABS: 80.0000 mg | ORAL_TABLET | Freq: Every day | ORAL | 3 refills | Status: DC

## 2019-12-02 MED ORDER — ALBUTEROL SULFATE HFA 108 (90 BASE) MCG/ACT IN AERS: 2.0000 | INHALATION_SPRAY | Freq: Four times a day (QID) | RESPIRATORY_TRACT | 3 refills | Status: DC | PRN

## 2019-12-02 MED ORDER — POTASSIUM CHLORIDE CRYS ER 20 MEQ PO TBCR: 20.0000 meq | EXTENDED_RELEASE_TABLET | Freq: Every day | ORAL | 3 refills | Status: DC

## 2019-12-02 MED FILL — FERROUS SULFATE 325 MG TAB: 325 (65 FE) | 10 days supply | Qty: 30 | Fill #0

## 2019-12-02 MED FILL — !VENTOLIN HFA INHALER: 108 (90 BAS | 25 days supply | Qty: 18 | Fill #0

## 2019-12-02 MED FILL — ?ERGOCALCIFEROL 50000 UNITC: 1.25 MG | 35 days supply | Qty: 5 | Fill #0

## 2019-12-02 MED FILL — ?CETIRIZINE HCL 10 MG TABLE: 10 | 30 days supply | Qty: 30 | Fill #0

## 2019-12-02 MED FILL — ALBUTEROL SUL 2.5 MG/3 ML S: (2.5 MG/3ML | 12 days supply | Qty: 150 | Fill #1

## 2019-12-02 NOTE — Telephone Encounter (Signed)
Sent all maintenance meds refilled with 3 refills.

## 2019-12-08 ENCOUNTER — Telehealth: Payer: Self-pay | Admitting: Family Medicine

## 2019-12-08 NOTE — Telephone Encounter (Signed)
Please advise 

## 2019-12-08 NOTE — Telephone Encounter (Signed)
Patient is experiencing numbness from shoulders to feet. This is worse on the right.  She states that she is unable to lay on her right side. She is also having a lot of difficulty when walking, she is dragging her right leg some and feels that her walking condition is worsening. She has not been able to come in to the office because she is waiting on Medicaid approval. She would like a call back to discuss what Dr. Prince Rome would like for her to do.  CB [phone]

## 2019-12-09 ENCOUNTER — Telehealth: Payer: Self-pay | Admitting: Student

## 2019-12-09 DIAGNOSIS — N939 Abnormal uterine and vaginal bleeding, unspecified: Secondary | ICD-10-CM

## 2019-12-09 MED ORDER — MEGESTROL ACETATE 40 MG PO TABS: 40.0000 mg | ORAL_TABLET | Freq: Two times a day (BID) | ORAL | 0 refills | Status: DC

## 2019-12-09 NOTE — Telephone Encounter (Signed)
Left message on the patient's voice mail to call me back. 

## 2019-12-09 NOTE — Telephone Encounter (Signed)
I looked at her spine MRI scans and her labs that were done recently.  There's nothing in the spine that would explain her symptoms.  But her labs show poorly controlled diabetes, severe vitamin D deficiency, and anemia.  These things could be the cause, so she should keep working on improving these numbers by following the advice of her PCP.

## 2019-12-09 NOTE — Telephone Encounter (Signed)
The patient stated she been bleeding since 11/21/2019. Passing big blood clots. Wearing two pads at one time. She also stated she needs something sent to help with the bleeding.  Pharmacy- [facility] [address] [place], [place], [place] [address]

## 2019-12-09 NOTE — Addendum Note (Signed)
Addended by: Marjo Bicker on: 12/09/2019 04:44 PM   Modules accepted: Orders

## 2019-12-09 NOTE — Telephone Encounter (Addendum)
Pt left VM on nurse line requesting a call back to talk about bleeding that has been occurring for 1 month. Reports having ablation on 08/20/19. Per chart review pt is being seen by Marice Potter, MD on 12/18/19.  Reviewed pt's hx and symptoms with Crisoforo Oxford, CNM. Megace 40 mg BID sent to pt's preferred pharmacy per Crisoforo Oxford, CNM. Pt to take until appt with Marice Potter, MD next week. Pt verbalized understanding and is agreeable with plan.

## 2019-12-10 ENCOUNTER — Telehealth: Payer: Self-pay | Admitting: Family Medicine

## 2019-12-10 MED FILL — MEGESTROL 40 MG TABLET: 40 | 15 days supply | Qty: 30 | Fill #0

## 2019-12-10 NOTE — Telephone Encounter (Signed)
Patient stated received a call from you and was returning your call.   Please call patient back.  [phone]

## 2019-12-10 NOTE — Telephone Encounter (Signed)
I called the patient back - see other message started yesterday.

## 2019-12-10 NOTE — Telephone Encounter (Signed)
I spoke with the patient and advised her of Dr. Prince Rome' response. She has gotten some new bloodwork done at her PCP's office because of inflammation found on recent blood tests. She voiced understanding and will check back with her PCP regarding the numbness and treatment for her vitamin D deficiency, anemia and diabetes.

## 2019-12-12 MED FILL — METOPROLOL SUCCINATE ER 50: 50 | 30 days supply | Qty: 30 | Fill #0

## 2019-12-12 MED FILL — POTASSIUM CL ER 20 MEQ TABL: 20 | 30 days supply | Qty: 30 | Fill #0

## 2019-12-12 MED FILL — LOSARTAN POTASSIUM 100 MG T: 100 | 30 days supply | Qty: 30 | Fill #0

## 2019-12-12 MED FILL — ?HUMALOG MIX 75-25 KWIKPEN: (75-25) 100 | 21 days supply | Qty: 45 | Fill #2

## 2019-12-15 ENCOUNTER — Telehealth: Payer: Self-pay | Admitting: Internal Medicine

## 2019-12-15 NOTE — Telephone Encounter (Signed)
Called and spoke with patient, she was complaining of numbness in legs and ortho asked her to follow up with primary. I transferred her to front to make an appointment.

## 2019-12-15 NOTE — Telephone Encounter (Signed)
Pt wants a call back. No further specification was provided.

## 2019-12-17 ENCOUNTER — Telehealth: Payer: Self-pay | Admitting: Nurse Practitioner

## 2019-12-17 NOTE — Telephone Encounter (Signed)
Pt was called and reminded of there appointment 

## 2019-12-18 ENCOUNTER — Ambulatory Visit: Payer: No Typology Code available for payment source | Admitting: Obstetrics & Gynecology

## 2019-12-18 ENCOUNTER — Encounter: Payer: Self-pay | Admitting: Nurse Practitioner

## 2019-12-18 ENCOUNTER — Ambulatory Visit (INDEPENDENT_AMBULATORY_CARE_PROVIDER_SITE_OTHER): Payer: No Typology Code available for payment source | Admitting: Nurse Practitioner

## 2019-12-18 ENCOUNTER — Other Ambulatory Visit: Payer: Self-pay

## 2019-12-18 DIAGNOSIS — R000 Tachycardia, unspecified: Secondary | ICD-10-CM

## 2019-12-18 DIAGNOSIS — M79605 Pain in left leg: Secondary | ICD-10-CM

## 2019-12-18 DIAGNOSIS — D649 Anemia, unspecified: Secondary | ICD-10-CM

## 2019-12-18 DIAGNOSIS — N939 Abnormal uterine and vaginal bleeding, unspecified: Secondary | ICD-10-CM

## 2019-12-18 DIAGNOSIS — R200 Anesthesia of skin: Secondary | ICD-10-CM

## 2019-12-18 DIAGNOSIS — R202 Paresthesia of skin: Secondary | ICD-10-CM

## 2019-12-18 DIAGNOSIS — J449 Chronic obstructive pulmonary disease, unspecified: Secondary | ICD-10-CM

## 2019-12-18 DIAGNOSIS — R0602 Shortness of breath: Secondary | ICD-10-CM

## 2019-12-18 DIAGNOSIS — M25512 Pain in left shoulder: Secondary | ICD-10-CM

## 2019-12-18 MED ORDER — LIDO-CAPSAICIN-MEN-METHYL SAL 0.5-0.035-5-20 % EX PTCH: 1.0000 | MEDICATED_PATCH | Freq: Every day | CUTANEOUS | 2 refills | Status: DC

## 2019-12-18 NOTE — Progress Notes (Signed)
Virtual Visit via Telephone Note  I connected with Jill Shaw on 12/18/19 at  1:50 PM EST by telephone and verified that I am speaking with the correct person using two identifiers.   I discussed the limitations, risks, security and privacy concerns of performing an evaluation and management service by telephone and the availability of in person appointments. I also discussed with the patient that there may be a patient responsible charge related to this service. The patient expressed understanding and agreed to proceed.   History of Present Illness:  Chief Complaint  Patient presents with  . Shoulder Pain    wants cream prescriped   . Tachycardia    pt says metoprolol doesn't help   . Numbness    legs and foot. ortho told her to see [place] for this   Jill Shaw's visit was changed to telemedicine visit per patient request . She  has a past medical history of Anemia, Arthritis, Asthma, COPD (chronic obstructive pulmonary disease) ([place]), Diabetes mellitus without complication ([place]), Dysfunctional uterine bleeding, GERD (gastroesophageal reflux disease), Hypertension, Neuromuscular disorder ([place]), Seizures ([place]) (09/12/2017), Sickle cell trait ([place]), Smoker, Vitamin D deficiency (10/2019), and Wears glasses.   She admits that she is having increased shoulder pain.  She describes as a sharp throbbing and aching pain that is getting worse.  She is having problems lifting her arm.  She admits that the pain was so severe she fell in the floor.  She was assisted up by her son.  She is also complaining of numbness in her feet and legs.  She was previously having problems on her right foot now she admits that is in both feet.  She has had a nerve conduction study on her right leg in the past.  She admits that she is losing feeling on her left side.  She states that now "she is having to drag her left foot."  She is having difficulty getting up to use the bathroom.  She admits that she always have a  family member around.  She has a history of anemia her last hemoglobin 9.4 and hematocrit was 31.4.  She admits that her menstrual cycle has been on since November 21, 2019.  She is passing large clots of blood which is causing a lot of pain.  The pain is causing weakness.  She is also complaining of tachycardia.  She admits that her heart rate is greater than 100 with just sitting.  She is currently on metoprolol succinate XL 50 mg every 24 hours.  She does not feel like it is effective.  She is having shortness of breath.  Observations/Objective: Patient is short of breath doing televisit  Assessment and Plan: Assessment  Primary Diagnosis & Pertinent Problem List: The primary encounter diagnosis was Shortness of breath. Diagnoses of Morbid obesity ([place]), Tachycardia, Chronic obstructive pulmonary disease, unspecified COPD type ([place]), Anemia, unspecified type, Acute pain of left shoulder, Left leg pain, Numbness and tingling of both lower extremities, and Abnormal uterine bleeding were also pertinent to this visit.  Visit Diagnosis: 1. Shortness of breath   2. Morbid obesity ([place])   3. Tachycardia   4. Chronic obstructive pulmonary disease, unspecified COPD type ([place])   5. Anemia, unspecified type   6. Acute pain of left shoulder   7. Left leg pain   8. Numbness and tingling of both lower extremities   9. Abnormal uterine bleeding     Follow Up Instructions: Jill Shaw was instructed to go to the  emergency room for evaluation.  She was instructed that she is exhibiting signs of anemia and hypovolemia.  She admits that she will have her family member take her to the emergency for evaluation.   I discussed the assessment and treatment plan with the patient. The patient was provided an opportunity to ask questions and all were answered. The patient agreed with the plan and demonstrated an understanding of the instructions.   The patient was advised to call back or seek an in-person  evaluation if the symptoms worsen or if the condition fails to improve as anticipated.  I provided 12 minutes of non-face-to-face time during this encounter.   Barbette Merino, NP

## 2019-12-19 ENCOUNTER — Observation Stay (HOSPITAL_COMMUNITY): Payer: Medicaid Other

## 2019-12-19 ENCOUNTER — Other Ambulatory Visit: Payer: Self-pay

## 2019-12-19 ENCOUNTER — Encounter (HOSPITAL_COMMUNITY): Payer: Self-pay

## 2019-12-19 ENCOUNTER — Observation Stay (HOSPITAL_COMMUNITY)
Admission: EM | Admit: 2019-12-19 | Discharge: 2019-12-20 | Disposition: A | Discharge status: Home / Self Care | Payer: Medicaid Other | Attending: Obstetrics & Gynecology | Admitting: Obstetrics & Gynecology

## 2019-12-19 DIAGNOSIS — K219 Gastro-esophageal reflux disease without esophagitis: Secondary | ICD-10-CM | POA: Insufficient documentation

## 2019-12-19 DIAGNOSIS — N182 Chronic kidney disease, stage 2 (mild): Secondary | ICD-10-CM | POA: Insufficient documentation

## 2019-12-19 DIAGNOSIS — E559 Vitamin D deficiency, unspecified: Secondary | ICD-10-CM | POA: Insufficient documentation

## 2019-12-19 DIAGNOSIS — Z79899 Other long term (current) drug therapy: Secondary | ICD-10-CM | POA: Insufficient documentation

## 2019-12-19 DIAGNOSIS — E1122 Type 2 diabetes mellitus with diabetic chronic kidney disease: Secondary | ICD-10-CM | POA: Insufficient documentation

## 2019-12-19 DIAGNOSIS — M1990 Unspecified osteoarthritis, unspecified site: Secondary | ICD-10-CM | POA: Diagnosis not present

## 2019-12-19 DIAGNOSIS — Z20822 Contact with and (suspected) exposure to covid-19: Secondary | ICD-10-CM | POA: Insufficient documentation

## 2019-12-19 DIAGNOSIS — I10 Essential (primary) hypertension: Secondary | ICD-10-CM | POA: Diagnosis present

## 2019-12-19 DIAGNOSIS — D649 Anemia, unspecified: Secondary | ICD-10-CM | POA: Diagnosis present

## 2019-12-19 DIAGNOSIS — Z8249 Family history of ischemic heart disease and other diseases of the circulatory system: Secondary | ICD-10-CM | POA: Insufficient documentation

## 2019-12-19 DIAGNOSIS — D573 Sickle-cell trait: Secondary | ICD-10-CM | POA: Insufficient documentation

## 2019-12-19 DIAGNOSIS — Z794 Long term (current) use of insulin: Secondary | ICD-10-CM

## 2019-12-19 DIAGNOSIS — E66813 Obesity, class 3: Secondary | ICD-10-CM | POA: Diagnosis present

## 2019-12-19 DIAGNOSIS — Z833 Family history of diabetes mellitus: Secondary | ICD-10-CM | POA: Insufficient documentation

## 2019-12-19 DIAGNOSIS — F17210 Nicotine dependence, cigarettes, uncomplicated: Secondary | ICD-10-CM | POA: Insufficient documentation

## 2019-12-19 DIAGNOSIS — J449 Chronic obstructive pulmonary disease, unspecified: Secondary | ICD-10-CM | POA: Insufficient documentation

## 2019-12-19 DIAGNOSIS — I129 Hypertensive chronic kidney disease with stage 1 through stage 4 chronic kidney disease, or unspecified chronic kidney disease: Secondary | ICD-10-CM | POA: Diagnosis not present

## 2019-12-19 DIAGNOSIS — N939 Abnormal uterine and vaginal bleeding, unspecified: Secondary | ICD-10-CM | POA: Diagnosis not present

## 2019-12-19 DIAGNOSIS — D62 Acute posthemorrhagic anemia: Secondary | ICD-10-CM

## 2019-12-19 DIAGNOSIS — D631 Anemia in chronic kidney disease: Secondary | ICD-10-CM | POA: Diagnosis not present

## 2019-12-19 DIAGNOSIS — D638 Anemia in other chronic diseases classified elsewhere: Secondary | ICD-10-CM | POA: Diagnosis present

## 2019-12-19 DIAGNOSIS — Z7951 Long term (current) use of inhaled steroids: Secondary | ICD-10-CM | POA: Insufficient documentation

## 2019-12-19 DIAGNOSIS — E119 Type 2 diabetes mellitus without complications: Secondary | ICD-10-CM

## 2019-12-19 LAB — CBC WITH DIFFERENTIAL/PLATELET
Abs Immature Granulocytes: 0 10*3/uL (ref 0.00–0.07)
Basophils Absolute: 0 10*3/uL (ref 0.0–0.1)
Basophils Relative: 0 %
Eosinophils Absolute: 0.3 10*3/uL (ref 0.0–0.5)
Eosinophils Relative: 3 %
HCT: 26.9 % — ABNORMAL LOW (ref 36.0–46.0)
Hemoglobin: 7.9 g/dL — ABNORMAL LOW (ref 12.0–15.0)
Lymphocytes Relative: 10 %
Lymphs Abs: 0.9 10*3/uL (ref 0.7–4.0)
MCH: 22.1 pg — ABNORMAL LOW (ref 26.0–34.0)
MCHC: 29.4 g/dL — ABNORMAL LOW (ref 30.0–36.0)
MCV: 75.4 fL — ABNORMAL LOW (ref 80.0–100.0)
Monocytes Absolute: 0 10*3/uL — ABNORMAL LOW (ref 0.1–1.0)
Monocytes Relative: 0 %
Neutro Abs: 7.8 10*3/uL — ABNORMAL HIGH (ref 1.7–7.7)
Neutrophils Relative %: 87 %
Platelets: 224 10*3/uL (ref 150–400)
RBC: 3.57 MIL/uL — ABNORMAL LOW (ref 3.87–5.11)
RDW: 21.2 % — ABNORMAL HIGH (ref 11.5–15.5)
WBC: 9 10*3/uL (ref 4.0–10.5)
nRBC: 0 % (ref 0.0–0.2)
nRBC: 0 /100 WBC

## 2019-12-19 LAB — URINALYSIS, ROUTINE W REFLEX MICROSCOPIC
Bacteria, UA: NONE SEEN
Bilirubin Urine: NEGATIVE
Glucose, UA: >=500 mg/dL — AB
Ketones, ur: NEGATIVE mg/dL
Leukocytes,Ua: NEGATIVE
Nitrite: NEGATIVE
Protein, ur: 30 mg/dL — AB
RBC / HPF: >50 RBC/hpf — ABNORMAL HIGH (ref 0–5)
Specific Gravity, Urine: 1.023 (ref 1.005–1.030)
pH: 6 (ref 5.0–8.0)

## 2019-12-19 LAB — ABO/RH: ABO/RH(D): O POS

## 2019-12-19 LAB — CBG MONITORING, ED: Glucose-Capillary: 256 mg/dL — ABNORMAL HIGH (ref 70–99)

## 2019-12-19 LAB — BASIC METABOLIC PANEL
Anion gap: 12 (ref 5–15)
BUN: 12 mg/dL (ref 6–20)
CO2: 24 mmol/L (ref 22–32)
Calcium: 8.9 mg/dL (ref 8.9–10.3)
Chloride: 100 mmol/L (ref 98–111)
Creatinine, Ser: 1.3 mg/dL — ABNORMAL HIGH (ref 0.44–1.00)
GFR calc Af Amer: 58 mL/min — ABNORMAL LOW (ref >60)
GFR calc non Af Amer: 50 mL/min — ABNORMAL LOW (ref >60)
Glucose, Bld: 341 mg/dL — ABNORMAL HIGH (ref 70–99)
Potassium: 3.6 mmol/L (ref 3.5–5.1)
Sodium: 136 mmol/L (ref 135–145)

## 2019-12-19 LAB — GLUCOSE, CAPILLARY: Glucose-Capillary: 417 mg/dL — ABNORMAL HIGH (ref 70–99)

## 2019-12-19 LAB — PREPARE RBC (CROSSMATCH)

## 2019-12-19 MED ORDER — DIPHENHYDRAMINE HCL 25 MG PO CAPS
25.0000 mg | ORAL_CAPSULE | Freq: Once | ORAL | Status: AC
  Administered 2019-12-19: 25 mg via ORAL
  Filled 2019-12-19: qty 1

## 2019-12-19 MED ORDER — LORATADINE 10 MG PO TABS
10.0000 mg | ORAL_TABLET | Freq: Every day | ORAL | Status: DC
  Administered 2019-12-20: 10 mg via ORAL
  Filled 2019-12-19: qty 1

## 2019-12-19 MED ORDER — INSULIN ASPART 100 UNIT/ML ~~LOC~~ SOLN
20.0000 [IU] | Freq: Once | SUBCUTANEOUS | Status: AC
  Administered 2019-12-19: 20 [IU] via SUBCUTANEOUS

## 2019-12-19 MED ORDER — INSULIN LISPRO PROT & LISPRO (75-25 MIX) 100 UNIT/ML KWIKPEN: 100.0000 [IU] | PEN_INJECTOR | Freq: Two times a day (BID) | SUBCUTANEOUS | Status: DC

## 2019-12-19 MED ORDER — ACETAMINOPHEN 500 MG PO TABS: 1000.0000 mg | ORAL_TABLET | Freq: Four times a day (QID) | ORAL | Status: DC | PRN

## 2019-12-19 MED ORDER — ZOLPIDEM TARTRATE 5 MG PO TABS: 5.0000 mg | ORAL_TABLET | Freq: Every evening | ORAL | Status: DC | PRN

## 2019-12-19 MED ORDER — ALUM & MAG HYDROXIDE-SIMETH 200-200-20 MG/5ML PO SUSP: 30.0000 mL | ORAL | Status: DC | PRN

## 2019-12-19 MED ORDER — MAGNESIUM HYDROXIDE 400 MG/5ML PO SUSP
30.0000 mL | Freq: Every day | ORAL | Status: DC | PRN
  Filled 2019-12-19: qty 30

## 2019-12-19 MED ORDER — METOPROLOL SUCCINATE ER 25 MG PO TB24
50.0000 mg | ORAL_TABLET | Freq: Every day | ORAL | Status: DC
  Administered 2019-12-20: 50 mg via ORAL
  Filled 2019-12-19: qty 2

## 2019-12-19 MED ORDER — SODIUM CHLORIDE 0.9 % IV BOLUS (SEPSIS)
1000.0000 mL | Freq: Once | INTRAVENOUS | Status: AC
  Administered 2019-12-19: 1000 mL via INTRAVENOUS

## 2019-12-19 MED ORDER — SODIUM CHLORIDE 0.9% IV SOLUTION: Freq: Once | INTRAVENOUS | Status: DC

## 2019-12-19 MED ORDER — FUROSEMIDE 80 MG PO TABS
80.0000 mg | ORAL_TABLET | Freq: Every day | ORAL | Status: DC
  Administered 2019-12-19 – 2019-12-20 (×2): 80 mg via ORAL
  Filled 2019-12-19 (×2): qty 1

## 2019-12-19 MED ORDER — MOMETASONE FURO-FORMOTEROL FUM 200-5 MCG/ACT IN AERO
2.0000 | INHALATION_SPRAY | Freq: Two times a day (BID) | RESPIRATORY_TRACT | Status: DC
  Administered 2019-12-19 – 2019-12-20 (×2): 2 via RESPIRATORY_TRACT
  Filled 2019-12-19: qty 8.8

## 2019-12-19 MED ORDER — TRANEXAMIC ACID 650 MG PO TABS
1300.0000 mg | ORAL_TABLET | Freq: Three times a day (TID) | ORAL | Status: DC
  Administered 2019-12-19 – 2019-12-20 (×3): 1300 mg via ORAL
  Filled 2019-12-19 (×6): qty 2

## 2019-12-19 MED ORDER — MAGNESIUM CITRATE PO SOLN: 1.0000 | Freq: Once | ORAL | Status: DC | PRN

## 2019-12-19 MED ORDER — ONDANSETRON HCL 4 MG/2ML IJ SOLN
4.0000 mg | Freq: Four times a day (QID) | INTRAMUSCULAR | Status: DC | PRN
  Administered 2019-12-19: 4 mg via INTRAVENOUS
  Filled 2019-12-19: qty 2

## 2019-12-19 MED ORDER — LINAGLIPTIN 5 MG PO TABS
5.0000 mg | ORAL_TABLET | Freq: Every day | ORAL | Status: DC
  Administered 2019-12-20: 5 mg via ORAL
  Filled 2019-12-19: qty 1

## 2019-12-19 MED ORDER — INSULIN ASPART PROT & ASPART (70-30 MIX) 100 UNIT/ML ~~LOC~~ SUSP
100.0000 [IU] | Freq: Two times a day (BID) | SUBCUTANEOUS | Status: DC
  Administered 2019-12-20: 100 [IU] via SUBCUTANEOUS
  Filled 2019-12-19: qty 10

## 2019-12-19 MED ORDER — ACETAMINOPHEN 500 MG PO TABS: 1000.0000 mg | ORAL_TABLET | Freq: Once | ORAL | Status: DC

## 2019-12-19 MED ORDER — VITAMIN D 25 MCG (1000 UNIT) PO TABS
5000.0000 [IU] | ORAL_TABLET | Freq: Every day | ORAL | Status: DC
  Administered 2019-12-20: 5000 [IU] via ORAL
  Filled 2019-12-19: qty 5

## 2019-12-19 MED ORDER — TIZANIDINE HCL 2 MG PO TABS
6.0000 mg | ORAL_TABLET | Freq: Three times a day (TID) | ORAL | Status: DC
  Administered 2019-12-19 – 2019-12-20 (×2): 6 mg via ORAL
  Filled 2019-12-19 (×2): qty 3

## 2019-12-19 MED ORDER — FLUTICASONE PROPIONATE 50 MCG/ACT NA SUSP
2.0000 | Freq: Every day | NASAL | Status: DC
  Filled 2019-12-19: qty 16

## 2019-12-19 MED ORDER — ALBUTEROL SULFATE HFA 108 (90 BASE) MCG/ACT IN AERS: 2.0000 | INHALATION_SPRAY | Freq: Four times a day (QID) | RESPIRATORY_TRACT | Status: DC | PRN

## 2019-12-19 MED ORDER — FLUTICASONE FUROATE-VILANTEROL 200-25 MCG/INH IN AEPB: 1.0000 | INHALATION_SPRAY | Freq: Every day | RESPIRATORY_TRACT | Status: DC

## 2019-12-19 MED ORDER — LIDOCAINE-CAPSAICIN 4-0.1 % EX CREA: 1.0000 "application " | TOPICAL_CREAM | Freq: Four times a day (QID) | CUTANEOUS | Status: DC | PRN

## 2019-12-19 MED ORDER — ALBUTEROL SULFATE (2.5 MG/3ML) 0.083% IN NEBU: 2.5000 mg | INHALATION_SOLUTION | Freq: Four times a day (QID) | RESPIRATORY_TRACT | Status: DC | PRN

## 2019-12-19 MED ORDER — GLUCOSAMINE SULFATE 1000 MG PO CAPS: 1.0000 | ORAL_CAPSULE | Freq: Two times a day (BID) | ORAL | Status: DC

## 2019-12-19 MED ORDER — POTASSIUM CHLORIDE CRYS ER 20 MEQ PO TBCR
20.0000 meq | EXTENDED_RELEASE_TABLET | Freq: Every day | ORAL | Status: DC
  Administered 2019-12-20: 20 meq via ORAL
  Filled 2019-12-19 (×2): qty 1

## 2019-12-19 MED ORDER — LOSARTAN POTASSIUM 50 MG PO TABS
100.0000 mg | ORAL_TABLET | Freq: Every day | ORAL | Status: DC
  Administered 2019-12-20: 100 mg via ORAL
  Filled 2019-12-19: qty 2

## 2019-12-19 MED ORDER — FERROUS SULFATE 325 (65 FE) MG PO TABS
325.0000 mg | ORAL_TABLET | Freq: Three times a day (TID) | ORAL | Status: DC
  Administered 2019-12-19 – 2019-12-20 (×3): 325 mg via ORAL
  Filled 2019-12-19 (×3): qty 1

## 2019-12-19 MED ORDER — SODIUM CHLORIDE 0.9 % IV SOLN
10.0000 mL/h | Freq: Once | INTRAVENOUS | Status: AC
  Administered 2019-12-20: 10 mL/h via INTRAVENOUS

## 2019-12-19 MED ORDER — TURMERIC 500 MG PO CAPS: 500.0000 mg | ORAL_CAPSULE | Freq: Two times a day (BID) | ORAL | Status: DC

## 2019-12-19 MED ORDER — ONDANSETRON HCL 4 MG PO TABS: 4.0000 mg | ORAL_TABLET | Freq: Four times a day (QID) | ORAL | Status: DC | PRN

## 2019-12-19 MED ORDER — OXYCODONE-ACETAMINOPHEN 5-325 MG PO TABS
1.0000 | ORAL_TABLET | ORAL | Status: DC | PRN
  Administered 2019-12-20: 2 via ORAL
  Filled 2019-12-19: qty 2

## 2019-12-19 MED ORDER — HYDROMORPHONE HCL 1 MG/ML IJ SOLN
0.2000 mg | INTRAMUSCULAR | Status: DC | PRN
  Administered 2019-12-19: 0.5 mg via INTRAVENOUS
  Filled 2019-12-19: qty 1

## 2019-12-19 MED ORDER — BISACODYL 5 MG PO TBEC: 5.0000 mg | DELAYED_RELEASE_TABLET | Freq: Every day | ORAL | Status: DC | PRN

## 2019-12-19 MED ORDER — HYDROCORTISONE 1 % EX CREA
TOPICAL_CREAM | Freq: Two times a day (BID) | CUTANEOUS | Status: DC
  Filled 2019-12-19: qty 28

## 2019-12-19 MED ORDER — DOCUSATE SODIUM 100 MG PO CAPS
100.0000 mg | ORAL_CAPSULE | Freq: Two times a day (BID) | ORAL | Status: DC
  Administered 2019-12-19 – 2019-12-20 (×2): 100 mg via ORAL
  Filled 2019-12-19 (×2): qty 1

## 2019-12-19 MED ORDER — MEGESTROL ACETATE 40 MG PO TABS
80.0000 mg | ORAL_TABLET | Freq: Two times a day (BID) | ORAL | Status: DC
  Administered 2019-12-19 – 2019-12-20 (×2): 80 mg via ORAL
  Filled 2019-12-19 (×3): qty 2

## 2019-12-19 NOTE — H&P (Signed)
Obstetrics and Gynecology Attending History and Physical  Jill Shaw is a 44 y.o. G5P5 with long history of AUB s/p endometrial ablation in 11/20, morbid obesity with BMI 46, DM and HTN who presented to [facility] ED today for evaluation of abnormal uterine bleeding.  Reported still having heavy bleeding most of the month (15-20 days per month) since ablation. Started on megace 40 mg bid a couple of weeks ago, did not help. Missed office appointment.  Still having small amount of bleeding with small clots, but feels very tired, weak, dizzy and lightheaded.   Denies any fevers, chills, sweats, dysuria, nausea, vomiting, other GI or GU symptoms or other general symptoms.  Past Medical History:  Diagnosis Date  . Anemia   . Arthritis    knees, hands  . Asthma   . COPD (chronic obstructive pulmonary disease) ([place])   . Diabetes mellitus without complication ([place])    type 2  . Dysfunctional uterine bleeding   . GERD (gastroesophageal reflux disease)   . Hypertension   . Neuromuscular disorder (HCC)    neuropathy feet  . Seizures ([place]) 09/12/2017   pt states r/t stress and blood sugar - no meds last one 4 months ago, not seen neurologist  . Sickle cell trait ([place])   . Smoker   . Vitamin D deficiency 10/2019  . Wears glasses    Past Surgical History:  Procedure Laterality Date  . CESAREAN SECTION     x 1  . DILATION AND CURETTAGE OF UTERUS N/A 08/20/2019   Procedure: DILATATION AND CURETTAGE;  Surgeon: Emily Filbert, MD;  Location: [place];  Service: Gynecology;  Laterality: N/A;  . ENDOMETRIAL ABLATION N/A 08/20/2019   Procedure: [place] Ablation;  Surgeon: Emily Filbert, MD;  Location: [place];  Service: Gynecology;  Laterality: N/A;  . EYE SURGERY Bilateral    laser right and cataract removed left eye  . RADIOLOGY WITH ANESTHESIA N/A 09/16/2019   Procedure: MRI WITH ANESTHESIA   L SPINE WITHOUT CONTRAST, T SPINE WITHOUT CONTRAST , CERVICAL WITHOUT CONTRAST;  Surgeon: Radiologist, Medication,  MD;  Location: [place];  Service: Radiology;  Laterality: N/A;  . TUBAL LIGATION    . UPPER GI ENDOSCOPY  07/2017   OB History  Gravida Para Term Preterm AB Living  5         3  SAB TAB Ectopic Multiple Live Births               # Outcome Date GA Lbr Len/2nd Weight Sex Delivery Anes PTL Lv  5 Gravida           4 Gravida           3 Gravida           2 Gravida           1 Gravida           Patient denies any other pertinent gynecologic issues.  No current facility-administered medications on file prior to encounter.   Current Outpatient Medications on File Prior to Encounter  Medication Sig Dispense Refill  . albuterol (PROVENTIL) (2.5 MG/3ML) 0.083% nebulizer solution Take 3 mLs (2.5 mg total) by nebulization every 6 (six) hours as needed for wheezing or shortness of breath. 150 mL 3  . albuterol (VENTOLIN HFA) 108 (90 Base) MCG/ACT inhaler Inhale 2 puffs into the lungs every 6 (six) hours as needed for wheezing or shortness of breath. 8 g 3  . Blood Glucose Monitoring Suppl (  TRUE METRIX METER) w/Device KIT 1 each by Does not apply route 4 (four) times daily -  before meals and at bedtime. 1 kit 0  . budesonide-formoterol (SYMBICORT) 160-4.5 MCG/ACT inhaler INHALE 2 PUFFS INTO THE LUNGS 2 (TWO) TIMES DAILY. (Patient taking differently: Inhale 2 puffs into the lungs 2 (two) times daily. ) 10.2 g 3  . cetirizine (ZYRTEC) 10 MG tablet Take 1 tablet (10 mg total) by mouth daily. 30 tablet 3  . Cholecalciferol (VITAMIN D-3) 125 MCG (5000 UT) TABS Take 1 tablet by mouth daily. (Patient taking differently: Take 5,000 Units by mouth daily. ) 90 tablet 3  . ferrous sulfate 325 (65 FE) MG tablet Take 1 tablet (325 mg total) by mouth 3 (three) times daily with meals. 30 tablet 3  . fluticasone (FLONASE) 50 MCG/ACT nasal spray Place 2 sprays into both nostrils daily. 16 g 6  . fluticasone furoate-vilanterol (BREO ELLIPTA) 200-25 MCG/INH AEPB Inhale 1 puff into the lungs daily. 14 each 0  . furosemide  (LASIX) 80 MG tablet Take 1 tablet (80 mg total) by mouth daily. 30 tablet 3  . Glucosamine Sulfate 1000 MG CAPS Take 1 capsule (1,000 mg total) by mouth 2 (two) times daily. 180 capsule 3  . glucose blood (TRUE METRIX BLOOD GLUCOSE TEST) test strip Use as instructed 100 each 12  . hydrocortisone 2.5 % cream Apply topically 2 (two) times daily. 30 g 2  . Insulin Lispro Prot & Lispro (HUMALOG MIX 75/25 KWIKPEN) (75-25) 100 UNIT/ML Kwikpen INJECT 100 UNITS EVERY 12 HOURS (Patient taking differently: Inject 100 Units into the skin every 12 (twelve) hours. ) 15 mL 11  . Insulin Pen Needle (PEN NEEDLES) 30G X 5 MM MISC 1 Units by Does not apply route as directed. 100 each 11  . Lido-Capsaicin-Men-Methyl Sal 0.5-0.035-5-20 % PTCH Apply 1 patch topically daily. 30 patch 2  . Lidocaine-Capsaicin 4-0.1 % CREA Apply 1 application topically 4 (four) times daily as needed. 30 g 2  . losartan (COZAAR) 100 MG tablet Take 1 tablet (100 mg total) by mouth daily. 30 tablet 3  . megestrol (MEGACE) 40 MG tablet Take 1 tablet (40 mg total) by mouth 2 (two) times daily. 30 tablet 0  . metoprolol succinate (TOPROL XL) 50 MG 24 hr tablet Take 1 tablet (50 mg total) by mouth daily. Take with or immediately following a meal. 30 tablet 3  . omeprazole (PRILOSEC) 40 MG capsule Take 1 capsule (40 mg total) by mouth daily. 30 capsule 3  . potassium chloride SA (KLOR-CON) 20 MEQ tablet Take 1 tablet (20 mEq total) by mouth daily. 30 tablet 3  . saxagliptin HCl (ONGLYZA) 2.5 MG TABS tablet Take 1 tablet (2.5 mg total) by mouth daily. 90 tablet 3  . tizanidine (ZANAFLEX) 6 MG capsule Take 1 capsule (6 mg total) by mouth 3 (three) times daily. 270 capsule 0  . Turmeric 500 MG CAPS Take 500 mg by mouth 2 (two) times daily. 180 capsule 3  . Vitamin D, Ergocalciferol, (DRISDOL) 1.25 MG (50000 UNIT) CAPS capsule Take 1 capsule (50,000 Units total) by mouth every 7 (seven) days. (Patient taking differently: Take 50,000 Units by mouth  every 7 (seven) days. Fridays) 5 capsule 6   Allergies  Allergen Reactions  . Ketoprofen Nausea And Vomiting  . Aspirin Nausea Only  . Gabapentin Nausea And Vomiting    upset stomach  . Ibuprofen Nausea And Vomiting  . Liraglutide Nausea And Vomiting  . Naproxen Nausea And Vomiting  .  Omeprazole-Sodium Bicarbonate Nausea And Vomiting  . Sulfa Antibiotics Nausea And Vomiting  . Tramadol Nausea And Vomiting    stomach upset    Social History:   reports that she has been smoking cigarettes. She has a 7.50 pack-year smoking history. She has never used smokeless tobacco. She reports that she does not drink alcohol or use drugs. Family History  Problem Relation Age of Onset  . Diabetes Mother   . Hypertension Mother     Review of Systems: Pertinent items noted in HPI and remainder of comprehensive ROS otherwise negative.  PHYSICAL EXAM: Blood pressure (!) 157/61, pulse (!) 120, temperature 98.3 F (36.8 C), temperature source Oral, resp. rate (!) 21, height '5\' 4"'$  (1.626 m), weight 122.5 kg, last menstrual period 11/22/2019, SpO2 100 %. CONSTITUTIONAL: Well-developed, well-nourished morbidly obese female in no acute distress.  HENT:  Normocephalic, atraumatic, External right and left ear normal. Oropharynx is clear and moist EYES: Conjunctivae and EOM are normal. Pupils are equal, round, and reactive to light. No scleral icterus.  NECK: Normal range of motion, supple, no masses SKIN: Skin is warm and dry. No rash noted. Not diaphoretic. No erythema. No pallor. NEUROLOGIC: Alert and oriented to person, place, and time. Normal reflexes, muscle tone coordination. No cranial nerve deficit noted. PSYCHIATRIC: Normal mood and affect. Normal behavior. Normal judgment and thought content. CARDIOVASCULAR: Elevated heart rate noted, regular rhythm RESPIRATORY: Effort and breath sounds normal, no problems with respiration noted ABDOMEN: Soft, nontender, nondistended. PELVIC: Small amount of  blood on pad MUSCULOSKELETAL: Normal range of motion. No tenderness.    Labs: Results for orders placed or performed during the hospital encounter of 12/19/19 (from the past 336 hour(s))  Urinalysis, Routine w reflex microscopic   Collection Time: 12/19/19  2:35 PM  Result Value Ref Range   Color, Urine AMBER (A) YELLOW   APPearance HAZY (A) CLEAR   Specific Gravity, Urine 1.023 1.005 - 1.030   pH 6.0 5.0 - 8.0   Glucose, UA >=500 (A) NEGATIVE mg/dL   Hgb urine dipstick LARGE (A) NEGATIVE   Bilirubin Urine NEGATIVE NEGATIVE   Ketones, ur NEGATIVE NEGATIVE mg/dL   Protein, ur 30 (A) NEGATIVE mg/dL   Nitrite NEGATIVE NEGATIVE   Leukocytes,Ua NEGATIVE NEGATIVE   RBC / HPF >50 (H) 0 - 5 RBC/hpf   WBC, UA 0-5 0 - 5 WBC/hpf   Bacteria, UA NONE SEEN NONE SEEN  Basic metabolic panel   Collection Time: 12/19/19  2:38 PM  Result Value Ref Range   Sodium 136 135 - 145 mmol/L   Potassium 3.6 3.5 - 5.1 mmol/L   Chloride 100 98 - 111 mmol/L   CO2 24 22 - 32 mmol/L   Glucose, Bld 341 (H) 70 - 99 mg/dL   BUN 12 6 - 20 mg/dL   Creatinine, Ser 1.30 (H) 0.44 - 1.00 mg/dL   Calcium 8.9 8.9 - 10.3 mg/dL   GFR calc non Af Amer 50 (L) >60 mL/min   GFR calc Af Amer 58 (L) >60 mL/min   Anion gap 12 5 - 15  CBC with Differential   Collection Time: 12/19/19  2:38 PM  Result Value Ref Range   WBC 9.0 4.0 - 10.5 K/uL   RBC 3.57 (L) 3.87 - 5.11 MIL/uL   Hemoglobin 7.9 (L) 12.0 - 15.0 g/dL   HCT 26.9 (L) 36.0 - 46.0 %   MCV 75.4 (L) 80.0 - 100.0 fL   MCH 22.1 (L) 26.0 - 34.0 pg   MCHC  29.4 (L) 30.0 - 36.0 g/dL   RDW 21.2 (H) 11.5 - 15.5 %   Platelets 224 150 - 400 K/uL   nRBC 0.0 0.0 - 0.2 %   Neutrophils Relative % 87 %   Neutro Abs 7.8 (H) 1.7 - 7.7 K/uL   Lymphocytes Relative 10 %   Lymphs Abs 0.9 0.7 - 4.0 K/uL   Monocytes Relative 0 %   Monocytes Absolute 0.0 (L) 0.1 - 1.0 K/uL   Eosinophils Relative 3 %   Eosinophils Absolute 0.3 0.0 - 0.5 K/uL   Basophils Relative 0 %   Basophils  Absolute 0.0 0.0 - 0.1 K/uL   nRBC 0 0 /100 WBC   Abs Immature Granulocytes 0.00 0.00 - 0.07 K/uL   Tear Drop Cells PRESENT    Polychromasia PRESENT     Imaging Studies: No results found.  Assessment: Principal Problem:   Symptomatic anemia Active Problems:   Type 2 diabetes mellitus without complication, with long-term current use of insulin (HCC)   Essential hypertension   Morbid obesity (HCC)   Anemia of chronic disease   Abnormal uterine bleeding (AUB)   CKD (chronic kidney disease) stage 2, GFR 60-89 ml/min   Plan: Admit to Med-Surg Floor Ordered for transfusion of 2 units pRBCs for now; follow up labs to be done in morning. Megace 80 mg po bid and Lysteda ordered Home medications reconciled and ordered Ultrasound ordered, will follow up results and manage accordingly. Pelvic cultures done and pending, will follow up results and manage accordingly. Analgesia as needed SCDs for VTE prophylaxis ADA/low Na diet ordered Routine floor care   Verita Schneiders, MD, [place], [facility] for [employer], [place]

## 2019-12-19 NOTE — ED Triage Notes (Signed)
Pt comes from home with [facility] complaining of vaginal bleeding with large clots. Pt has history of uterine ablation in November 2020, but the bleeding has not stopped. She reports 15-25 days per month of bleeding.

## 2019-12-19 NOTE — ED Notes (Signed)
Report attempted to [facility], unsuccessful.  

## 2019-12-19 NOTE — ED Provider Notes (Addendum)
[facility] EMERGENCY DEPARTMENT Provider Note   CSN: [id number] Arrival date & time: 12/19/19  1417     History Chief Complaint  Patient presents with  . Vaginal Bleeding    Jill Shaw is a 44 y.o. female.  Patient is a 44 year old obese female with past medical history of dysfunctional uterine bleeding with recent uterine ablation done on November 4 presenting to the emergency department for vaginal bleeding.  She reports that since her ablation she has had vaginal bleeding with heavy clots at least 15 to 20 days out of the month.  She was given a prescription for Megace twice daily on 23 February.  Reports that she took all of this.  She was supposed to follow-up last week with OB/GYN but felt like she was too tired to go to the visit so she did not go.  Reports that today she began to have even more clots and reports feeling lower pelvic cramping and feels like she is giving birth to the blood clots per her report.  Denies any fever, chills, nausea, vomiting.        Past Medical History:  Diagnosis Date  . Anemia   . Arthritis    knees, hands  . Asthma   . COPD (chronic obstructive pulmonary disease) ([place])   . Diabetes mellitus without complication ([place])    type 2  . Dysfunctional uterine bleeding   . GERD (gastroesophageal reflux disease)   . Hypertension   . Neuromuscular disorder (HCC)    neuropathy feet  . Seizures ([place]) 09/12/2017   pt states r/t stress and blood sugar - no meds last one 4 months ago, not seen neurologist  . Sickle cell trait ([place])   . Smoker   . Vitamin D deficiency 10/2019  . Wears glasses     Patient Active Problem List   Diagnosis Date Noted  . Symptomatic anemia 12/19/2019  . Elevated sed rate 11/26/2019  . Elevated C-reactive protein (CRP) 11/26/2019  . Hypoglycemia 02/07/2019  . Hypotension 02/07/2019  . Lactic acidosis 02/07/2019  . Right ankle pain 02/07/2019  . Chronic obstructive pulmonary disease ([place])  02/05/2019  . Seizure ([place]) 07/31/2018  . Vitamin D deficiency 05/31/2017  . Gastroesophageal reflux disease 05/24/2017  . Abnormal uterine bleeding (AUB) 05/17/2017  . Anemia of chronic disease 04/06/2017  . Knee pain, chronic 03/27/2016  . Type 2 diabetes mellitus without complication, with long-term current use of insulin ([place]) 03/27/2016  . Essential hypertension 03/27/2016  . Morbid obesity ([place]) 03/27/2016  . Irritable bowel syndrome with constipation 03/27/2016  . Tobacco dependence 03/27/2016  . CKD (chronic kidney disease) stage 2, GFR 60-89 ml/min 03/25/2016  . Arthropathy, lower leg 05/04/2013  . CTS (carpal tunnel syndrome) 05/04/2013  . Peripheral edema 05/04/2013  . Chronic pain 05/13/2012  . Diabetic gastroparesis ([place]) 11/06/2006  . Hot flashes 11/06/2006    Past Surgical History:  Procedure Laterality Date  . CESAREAN SECTION     x 1  . DILATION AND CURETTAGE OF UTERUS N/A 08/20/2019   Procedure: DILATATION AND CURETTAGE;  Surgeon: Emily Filbert, MD;  Location: [facility];  Service: Gynecology;  Laterality: N/A;  . ENDOMETRIAL ABLATION N/A 08/20/2019   Procedure: [place] Ablation;  Surgeon: Emily Filbert, MD;  Location: [facility];  Service: Gynecology;  Laterality: N/A;  . EYE SURGERY Bilateral    laser right and cataract removed left eye  . RADIOLOGY WITH ANESTHESIA N/A 09/16/2019   Procedure: MRI WITH ANESTHESIA  L SPINE WITHOUT CONTRAST, T SPINE WITHOUT CONTRAST , CERVICAL WITHOUT CONTRAST;  Surgeon: Radiologist, Medication, MD;  Location: [facility];  Service: Radiology;  Laterality: N/A;  . TUBAL LIGATION    . UPPER GI ENDOSCOPY  07/2017     OB History    Gravida  5   Para      Term      Preterm      AB      Living  3     SAB      TAB      Ectopic      Multiple      Live Births              Family History  Problem Relation Age of Onset  . Diabetes Mother   . Hypertension Mother     Social History   Tobacco Use  . Smoking status: Current  Every Day Smoker    Packs/day: 0.50    Years: 15.00    Pack years: 7.50    Types: Cigarettes  . Smokeless tobacco: Never Used  Substance Use Topics  . Alcohol use: No  . Drug use: No    Home Medications Prior to Admission medications   Medication Sig Start Date End Date Taking? Authorizing Provider  albuterol (PROVENTIL) (2.5 MG/3ML) 0.083% nebulizer solution Take 3 mLs (2.5 mg total) by nebulization every 6 (six) hours as needed for wheezing or shortness of breath. 12/02/19  Yes Vevelyn Francois, NP  albuterol (VENTOLIN HFA) 108 (90 Base) MCG/ACT inhaler Inhale 2 puffs into the lungs every 6 (six) hours as needed for wheezing or shortness of breath. 12/02/19  Yes Vevelyn Francois, NP  Blood Glucose Monitoring Suppl (TRUE METRIX METER) w/Device KIT 1 each by Does not apply route 4 (four) times daily -  before meals and at bedtime. 01/21/18  Yes Dorena Dew, FNP  budesonide-formoterol (SYMBICORT) 160-4.5 MCG/ACT inhaler INHALE 2 PUFFS INTO THE LUNGS 2 (TWO) TIMES DAILY. Patient taking differently: Inhale 2 puffs into the lungs 2 (two) times daily.  12/02/19  Yes Vevelyn Francois, NP  cetirizine (ZYRTEC) 10 MG tablet Take 1 tablet (10 mg total) by mouth daily. 12/02/19  Yes Vevelyn Francois, NP  Cholecalciferol (VITAMIN D-3) 125 MCG (5000 UT) TABS Take 1 tablet by mouth daily. Patient taking differently: Take 5,000 Units by mouth daily.  08/22/19  Yes Hilts, Legrand Como, MD  ferrous sulfate 325 (65 FE) MG tablet Take 1 tablet (325 mg total) by mouth 3 (three) times daily with meals. 12/02/19  Yes King, Diona Foley, NP  fluticasone (FLONASE) 50 MCG/ACT nasal spray Place 2 sprays into both nostrils daily. 11/24/19  Yes King, Diona Foley, NP  fluticasone furoate-vilanterol (BREO ELLIPTA) 200-25 MCG/INH AEPB Inhale 1 puff into the lungs daily. 11/04/19  Yes Icard, Bradley L, DO  furosemide (LASIX) 80 MG tablet Take 1 tablet (80 mg total) by mouth daily. 12/02/19  Yes Vevelyn Francois, NP  Glucosamine Sulfate 1000  MG CAPS Take 1 capsule (1,000 mg total) by mouth 2 (two) times daily. 08/22/19  Yes Hilts, Legrand Como, MD  glucose blood (TRUE METRIX BLOOD GLUCOSE TEST) test strip Use as instructed 01/21/18  Yes Dorena Dew, FNP  hydrocortisone 2.5 % cream Apply topically 2 (two) times daily. 10/14/19  Yes Dorena Dew, FNP  Insulin Lispro Prot & Lispro (HUMALOG MIX 75/25 KWIKPEN) (75-25) 100 UNIT/ML Kwikpen INJECT 100 UNITS EVERY 12 HOURS Patient taking differently: Inject 100 Units into the  skin every 12 (twelve) hours.  12/02/19  Yes King, Diona Foley, NP  Insulin Pen Needle (PEN NEEDLES) 30G X 5 MM MISC 1 Units by Does not apply route as directed. 12/02/19  Yes Vevelyn Francois, NP  Lido-Capsaicin-Men-Methyl Sal 0.5-0.035-5-20 % PTCH Apply 1 patch topically daily. 12/18/19 03/17/20 Yes King, Diona Foley, NP  Lidocaine-Capsaicin 4-0.1 % CREA Apply 1 application topically 4 (four) times daily as needed. 11/24/19 02/22/20 Yes King, Diona Foley, NP  losartan (COZAAR) 100 MG tablet Take 1 tablet (100 mg total) by mouth daily. 12/02/19  Yes Vevelyn Francois, NP  megestrol (MEGACE) 40 MG tablet Take 1 tablet (40 mg total) by mouth 2 (two) times daily. 12/09/19  Yes Starr Lake, CNM  metoprolol succinate (TOPROL XL) 50 MG 24 hr tablet Take 1 tablet (50 mg total) by mouth daily. Take with or immediately following a meal. 12/02/19 03/01/20 Yes King, Diona Foley, NP  omeprazole (PRILOSEC) 40 MG capsule Take 1 capsule (40 mg total) by mouth daily. 12/02/19  Yes King, Diona Foley, NP  potassium chloride SA (KLOR-CON) 20 MEQ tablet Take 1 tablet (20 mEq total) by mouth daily. 12/02/19  Yes Vevelyn Francois, NP  saxagliptin HCl (ONGLYZA) 2.5 MG TABS tablet Take 1 tablet (2.5 mg total) by mouth daily. 12/02/19  Yes Vevelyn Francois, NP  tizanidine (ZANAFLEX) 6 MG capsule Take 1 capsule (6 mg total) by mouth 3 (three) times daily. 11/24/19 02/22/20 Yes King, Diona Foley, NP  Turmeric 500 MG CAPS Take 500 mg by mouth 2 (two) times daily. 08/22/19   Yes Hilts, Legrand Como, MD  Vitamin D, Ergocalciferol, (DRISDOL) 1.25 MG (50000 UNIT) CAPS capsule Take 1 capsule (50,000 Units total) by mouth every 7 (seven) days. Patient taking differently: Take 50,000 Units by mouth every 7 (seven) days. Fridays 12/02/19  Yes Vevelyn Francois, NP    Allergies    Ketoprofen, Aspirin, Gabapentin, Ibuprofen, Liraglutide, Naproxen, Omeprazole-sodium bicarbonate, Sulfa antibiotics, and Tramadol  Review of Systems   Review of Systems  Constitutional: Positive for fatigue. Negative for chills and fever.  HENT: Negative for ear pain and sore throat.   Eyes: Negative for pain and visual disturbance.  Respiratory: Negative for cough and shortness of breath.   Cardiovascular: Negative for chest pain and palpitations.  Gastrointestinal: Negative for abdominal pain and vomiting.  Genitourinary: Positive for menstrual problem, pelvic pain and vaginal bleeding. Negative for difficulty urinating, dysuria, frequency and hematuria.  Musculoskeletal: Negative for arthralgias and back pain.  Skin: Negative for color change and rash.  Neurological: Positive for light-headedness. Negative for seizures and syncope.  Hematological: Does not bruise/bleed easily.  All other systems reviewed and are negative.   Physical Exam Updated Vital Signs BP (!) 157/61 (BP Location: Right Arm)   Pulse (!) 120   Temp 98.3 F (36.8 C) (Oral)   Resp (!) 21   Ht _0  (1.626 m)   Wt 122.5 kg   LMP 11/22/2019 Comment: Tubal ligation   SpO2 100%   BMI 46.35 kg/m   Physical Exam Vitals and nursing note reviewed. Exam conducted with a chaperone present.  Constitutional:      Appearance: Normal appearance.  HENT:     Head: Normocephalic.     Nose: Nose normal.     Mouth/Throat:     Mouth: Mucous membranes are moist.  Eyes:     Conjunctiva/sclera: Conjunctivae normal.  Cardiovascular:     Rate and Rhythm: Regular rhythm. Tachycardia present.  Pulmonary:  Effort: Pulmonary  effort is normal.  Abdominal:     General: Abdomen is flat.     Palpations: Abdomen is soft.     Tenderness: There is abdominal tenderness. There is no guarding.  Genitourinary:    Vagina: Bleeding present.     Cervix: No cervical motion tenderness, friability or erythema.     Uterus: Tender.   Skin:    General: Skin is warm and dry.  Neurological:     Mental Status: She is alert.  Psychiatric:        Mood and Affect: Mood normal.     ED Results / Procedures / Treatments   Labs (all labs ordered are listed, but only abnormal results are displayed) Labs Reviewed  URINALYSIS, ROUTINE W REFLEX MICROSCOPIC - Abnormal; Notable for the following components:      Result Value   Color, Urine AMBER (*)    APPearance HAZY (*)    Glucose, UA >=500 (*)    Hgb urine dipstick LARGE (*)    Protein, ur 30 (*)    RBC / HPF >50 (*)    All other components within normal limits  BASIC METABOLIC PANEL - Abnormal; Notable for the following components:   Glucose, Bld 341 (*)    Creatinine, Ser 1.30 (*)    GFR calc non Af Amer 50 (*)    GFR calc Af Amer 58 (*)    All other components within normal limits  CBC WITH DIFFERENTIAL/PLATELET - Abnormal; Notable for the following components:   RBC 3.57 (*)    Hemoglobin 7.9 (*)    HCT 26.9 (*)    MCV 75.4 (*)    MCH 22.1 (*)    MCHC 29.4 (*)    RDW 21.2 (*)    Neutro Abs 7.8 (*)    Monocytes Absolute 0.0 (*)    All other components within normal limits  WET PREP, GENITAL  TYPE AND SCREEN  PREPARE RBC (CROSSMATCH)  GC/CHLAMYDIA PROBE AMP () NOT AT [facility]    EKG None  Radiology No results found.  Procedures Procedures (including critical care time)  Medications Ordered in ED Medications  0.9 %  sodium chloride infusion (has no administration in time range)  megestrol (MEGACE) tablet 80 mg (has no administration in time range)  tranexamic acid (LYSTEDA) tablet 1,300 mg (has no administration in time range)  0.9 %  sodium  chloride infusion (Manually program via Guardrails IV Fluids) (has no administration in time range)  acetaminophen (TYLENOL) tablet 1,000 mg (has no administration in time range)  diphenhydrAMINE (BENADRYL) capsule 25 mg (has no administration in time range)  furosemide (LASIX) tablet 80 mg (has no administration in time range)  losartan (COZAAR) tablet 100 mg (has no administration in time range)  metoprolol succinate (TOPROL-XL) 24 hr tablet 50 mg (has no administration in time range)  Insulin Lispro Prot & Lispro (HUMALOG 75/25 MIX) (75-25) 100 UNIT/ML KwikPen 100 Units (has no administration in time range)  linagliptin (TRADJENTA) tablet 5 mg (has no administration in time range)  ferrous sulfate tablet 325 mg (has no administration in time range)  Glucosamine Sulfate CAPS 1,000 mg (has no administration in time range)  Turmeric CAPS 500 mg (has no administration in time range)  tizanidine (ZANAFLEX) capsule 6 mg (has no administration in time range)  Vitamin D-3 TABS 5,000 Units (has no administration in time range)  potassium chloride SA (KLOR-CON) CR tablet 20 mEq (has no administration in time range)  albuterol (PROVENTIL) (2.5  MG/3ML) 0.083% nebulizer solution 2.5 mg (has no administration in time range)  albuterol (VENTOLIN HFA) 108 (90 Base) MCG/ACT inhaler 2 puff (has no administration in time range)  mometasone-formoterol (DULERA) 200-5 MCG/ACT inhaler 2 puff (has no administration in time range)  loratadine (CLARITIN) tablet 10 mg (has no administration in time range)  fluticasone (FLONASE) 50 MCG/ACT nasal spray 2 spray (has no administration in time range)  fluticasone furoate-vilanterol (BREO ELLIPTA) 200-25 MCG/INH 1 puff (has no administration in time range)  hydrocortisone 2.5 % cream (has no administration in time range)  Lidocaine-Capsaicin 0-3.5 % CREA 1 application (has no administration in time range)  oxyCODONE-acetaminophen (PERCOCET/ROXICET) 5-325 MG per tablet 1-2  tablet (has no administration in time range)  HYDROmorphone (DILAUDID) injection 0.2-0.6 mg (has no administration in time range)  zolpidem (AMBIEN) tablet 5 mg (has no administration in time range)  docusate sodium (COLACE) capsule 100 mg (has no administration in time range)  magnesium hydroxide (MILK OF MAGNESIA) suspension 30 mL (has no administration in time range)  bisacodyl (DULCOLAX) EC tablet 5 mg (has no administration in time range)  magnesium citrate solution 1 Bottle (has no administration in time range)  alum & mag hydroxide-simeth (MAALOX/MYLANTA) 200-200-20 MG/5ML suspension 30 mL (has no administration in time range)  ondansetron (ZOFRAN) tablet 4 mg (has no administration in time range)    Or  ondansetron (ZOFRAN) injection 4 mg (has no administration in time range)  acetaminophen (TYLENOL) tablet 1,000 mg (has no administration in time range)  sodium chloride 0.9 % bolus 1,000 mL (1,000 mLs Intravenous New Bag/Given 12/19/19 1458)    ED Course  I have reviewed the triage vital signs and the nursing notes.  Pertinent labs & imaging results that were available during my care of the patient were reviewed by me and considered in my medical decision making (see chart for details).  Clinical Course as of Dec 18 1604  Fri Dec 19, 2019  1441 Patient with history of abnormal uterine bleeding with recent ablation in November presents to the emergency department for having ongoing heavy bleeding multiple days out of the month.  Has missed all of her follow-up appointments with OB/GYN.  She reports she missed the most recent one due to feeling too fatigued.  Was started on Megace by primary care but she reports this is not helping.  On arrival she is mildly anxious and tachycardic to 120.  For this reason and due to her history of anemia labs were obtained.  Pelvic exam was performed by myself and vaginal bleeding was noted.  Mildly tender uterus.   [KM]  4656 Patient CBC revealing  hmoglobin of 7.9. Patient endorses fatigue, SOB, lightheadedness and is already taking megace. For this reason, will consult with obgyn due to active vaginal bleeding and symptomatic anemia.    [KM]  6 Dr. Harolyn Rutherford obgyn to admit for symptomatic anemia.  Will begin transfusion here in the ED.  Discussed risks and benefits of blood transfusion to the patient and she consents to the procedure.   [KM]    Clinical Course User Index [KM] Kristine Royal   MDM Rules/Calculators/A&P                      CRITICAL CARE Performed by: Alveria Apley   Total critical care time: 35 minutes  Critical care time was exclusive of separately billable procedures and treating other patients.  Critical care was necessary to treat or prevent imminent or  life-threatening deterioration.  Critical care was time spent personally by me on the following activities: development of treatment plan with patient and/or surrogate as well as nursing, discussions with consultants, evaluation of patient's response to treatment, examination of patient, obtaining history from patient or surrogate, ordering and performing treatments and interventions, ordering and review of laboratory studies, ordering and review of radiographic studies, pulse oximetry and re-evaluation of patient's condition.  Final Clinical Impression(s) / ED Diagnoses Final diagnoses:  Vaginal bleeding  Symptomatic anemia    Rx / DC Orders ED Discharge Orders    None       Alveria Apley, PA-C 12/19/19 1557    Alveria Apley, PA-C 12/19/19 1606    Little, Wenda Overland, MD 12/23/19 [phone]

## 2019-12-20 ENCOUNTER — Observation Stay (HOSPITAL_COMMUNITY): Payer: Medicaid Other

## 2019-12-20 DIAGNOSIS — D649 Anemia, unspecified: Secondary | ICD-10-CM

## 2019-12-20 DIAGNOSIS — I129 Hypertensive chronic kidney disease with stage 1 through stage 4 chronic kidney disease, or unspecified chronic kidney disease: Secondary | ICD-10-CM

## 2019-12-20 DIAGNOSIS — Z794 Long term (current) use of insulin: Secondary | ICD-10-CM

## 2019-12-20 DIAGNOSIS — E1122 Type 2 diabetes mellitus with diabetic chronic kidney disease: Secondary | ICD-10-CM

## 2019-12-20 DIAGNOSIS — N182 Chronic kidney disease, stage 2 (mild): Secondary | ICD-10-CM

## 2019-12-20 DIAGNOSIS — N939 Abnormal uterine and vaginal bleeding, unspecified: Secondary | ICD-10-CM

## 2019-12-20 LAB — GLUCOSE, CAPILLARY
Glucose-Capillary: 341 mg/dL — ABNORMAL HIGH (ref 70–99)
Glucose-Capillary: 404 mg/dL — ABNORMAL HIGH (ref 70–99)

## 2019-12-20 LAB — BASIC METABOLIC PANEL
Anion gap: 11 (ref 5–15)
BUN: 11 mg/dL (ref 6–20)
CO2: 23 mmol/L (ref 22–32)
Calcium: 8.5 mg/dL — ABNORMAL LOW (ref 8.9–10.3)
Chloride: 103 mmol/L (ref 98–111)
Creatinine, Ser: 1.06 mg/dL — ABNORMAL HIGH (ref 0.44–1.00)
GFR calc Af Amer: >60 mL/min (ref >60)
GFR calc non Af Amer: >60 mL/min (ref >60)
Glucose, Bld: 296 mg/dL — ABNORMAL HIGH (ref 70–99)
Potassium: 3.8 mmol/L (ref 3.5–5.1)
Sodium: 137 mmol/L (ref 135–145)

## 2019-12-20 LAB — TYPE AND SCREEN
ABO/RH(D): O POS
Antibody Screen: NEGATIVE
Unit division: 0
Unit division: 0

## 2019-12-20 LAB — BPAM RBC
Blood Product Expiration Date: 202104042359
Blood Product Expiration Date: 202104052359
ISSUE DATE / TIME: 202103051725
ISSUE DATE / TIME: 202103052252
Unit Type and Rh: 5100
Unit Type and Rh: 5100

## 2019-12-20 LAB — CBC
HCT: 31.9 % — ABNORMAL LOW (ref 36.0–46.0)
Hemoglobin: 9.7 g/dL — ABNORMAL LOW (ref 12.0–15.0)
MCH: 23.6 pg — ABNORMAL LOW (ref 26.0–34.0)
MCHC: 30.4 g/dL (ref 30.0–36.0)
MCV: 77.6 fL — ABNORMAL LOW (ref 80.0–100.0)
Platelets: 198 10*3/uL (ref 150–400)
RBC: 4.11 MIL/uL (ref 3.87–5.11)
RDW: 21 % — ABNORMAL HIGH (ref 11.5–15.5)
WBC: 9 10*3/uL (ref 4.0–10.5)
nRBC: 0 % (ref 0.0–0.2)

## 2019-12-20 LAB — SARS CORONAVIRUS 2 (TAT 6-24 HRS): SARS Coronavirus 2: NEGATIVE

## 2019-12-20 MED ORDER — CAPSAICIN 0.025 % EX CREA: TOPICAL_CREAM | Freq: Four times a day (QID) | CUTANEOUS | Status: DC | PRN

## 2019-12-20 MED ORDER — LIDOCAINE 4 % EX CREA: TOPICAL_CREAM | Freq: Four times a day (QID) | CUTANEOUS | Status: DC | PRN

## 2019-12-20 MED ORDER — TRANEXAMIC ACID 650 MG PO TABS: 1300.0000 mg | ORAL_TABLET | Freq: Three times a day (TID) | ORAL | 0 refills | Status: DC

## 2019-12-20 MED ORDER — MEGESTROL ACETATE 40 MG PO TABS: 80.0000 mg | ORAL_TABLET | Freq: Two times a day (BID) | ORAL | 2 refills | Status: DC

## 2019-12-20 NOTE — Discharge Instructions (Signed)
Abnormal Uterine Bleeding °Abnormal uterine bleeding means bleeding more than usual from your uterus. It can include: °· Bleeding between periods. °· Bleeding after sex. °· Bleeding that is heavier than normal. °· Periods that last longer than usual. °· Bleeding after you have stopped having your period (menopause). °There are many problems that may cause this. You should see a doctor for any kind of bleeding that is not normal. Treatment depends on the cause of the bleeding. °Follow these instructions at home: °· Watch your condition for any changes. °· Do not use tampons, douche, or have sex, if your doctor tells you not to. °· Change your pads often. °· Get regular well-woman exams. Make sure they include a pelvic exam and cervical cancer screening. °· Keep all follow-up visits as told by your doctor. This is important. °Contact a doctor if: °· The bleeding lasts more than one week. °· You feel dizzy at times. °· You feel like you are going to throw up (nauseous). °· You throw up. °Get help right away if: °· You pass out. °· You have to change pads every hour. °· You have belly (abdominal) pain. °· You have a fever. °· You get sweaty. °· You get weak. °· You passing large blood clots from your vagina. °Summary °· Abnormal uterine bleeding means bleeding more than usual from your uterus. °· There are many problems that may cause this. You should see a doctor for any kind of bleeding that is not normal. °· Treatment depends on the cause of the bleeding. °This information is not intended to replace advice given to you by your health care provider. Make sure you discuss any questions you have with your health care provider. °Document Revised: 09/26/2016 Document Reviewed: 09/26/2016 °Elsevier Patient Education © 2020 [employer] ° °

## 2019-12-20 NOTE — Progress Notes (Signed)
Received patient here in 6N from ED, alert orientedx4, skin intact with vital signs as follow: Temp:98.7, BP: 150/62 (85), HR: 108, RR:15, Spo2: 100 on RA. Patient still receiving 1st unit of PRBC, will transfuse 2nd unit when transfusion completed. Patient oriented to room and bed controls, left comfortably in bed with call bell at reach. Will continue to monitor patient with remainder of shift.

## 2019-12-20 NOTE — Discharge Summary (Signed)
Physician Discharge Summary  Patient ID: Jill Shaw MRN: [id number] DOB/AGE: [birth year] 43 y.o.  Admit date: 12/19/2019 Discharge date: 12/20/2019   Discharge Diagnoses:  Principal Problem:   Symptomatic anemia Active Problems:   Type 2 diabetes mellitus without complication, with long-term current use of insulin (HCC)   Essential hypertension   Morbid obesity (HCC)   Anemia of chronic disease   Abnormal uterine bleeding (AUB)   CKD (chronic kidney disease) stage 2, GFR 60-89 ml/min   Consults: None  Significant Diagnostic Studies:  CBG (last 3)  Recent Labs    12/19/19 1805 12/19/19 2208 12/20/19 0814  GLUCAP 256* 417* 341*   CBC Latest Ref Rng & Units 12/20/2019 12/19/2019 11/05/2019  WBC 4.0 - 10.5 K/uL 9.0 9.0 12.4(H)  Hemoglobin 12.0 - 15.0 g/dL 9.7(L) 7.9(L) 9.4(L)  Hematocrit 36.0 - 46.0 % 31.9(L) 26.9(L) 31.4(L)  Platelets 150 - 400 K/uL 198 224 319   BMET    Component Value Date/Time   NA 137 12/20/2019 0309   NA 137 11/05/2019 1409   K 3.8 12/20/2019 0309   CL 103 12/20/2019 0309   CO2 23 12/20/2019 0309   GLUCOSE 296 (H) 12/20/2019 0309   BUN 11 12/20/2019 0309   BUN 14 11/05/2019 1409   CREATININE 1.06 (H) 12/20/2019 0309   CREATININE 1.18 (H) 08/31/2017 1621   CALCIUM 8.5 (L) 12/20/2019 0309   GFRNONAA >60 12/20/2019 0309   GFRNONAA 58 (L) 08/31/2017 1621   GFRAA >60 12/20/2019 0309   GFRAA 67 08/31/2017 1621     US PELVIC COMPLETE WITH TRANSVAGINAL  Result Date: 12/20/2019 CLINICAL DATA:  44 year old female with history of abnormal vaginal bleeding. Prior history of uterine ablation in November 2020. EXAM: TRANSABDOMINAL AND TRANSVAGINAL ULTRASOUND OF PELVIS TECHNIQUE: Both transabdominal and transvaginal ultrasound examinations of the pelvis were performed. Transabdominal technique was performed for global imaging of the pelvis including uterus, ovaries, adnexal regions, and pelvic cul-de-sac. It was necessary to proceed with endovaginal exam  following the transabdominal exam to visualize the adnexa. COMPARISON:  Pelvic ultrasound 05/17/2017. FINDINGS: Uterus Measurements: 11.7 x 7.0 x 6.5 cm = volume: 268 mL. No fibroids or other mass visualized. Endometrium Thickness: 15.3 mm. On sagittal image 103 of the cine capture through the uterus there is a nodular appearance of the endometrium measuring approximately 1.7 x 1.7 cm where there is some heterogeneous internal echogenicity. Right ovary Measurements: 4.0 x 2.4 x 2.7 cm = volume: 13.6 mL. Normal appearance/no adnexal mass. Left ovary Measurements: 5.2 x 4.4 x 5.8 cm = volume: 70.5 mL. Normal appearance/no adnexal mass. Other findings No abnormal free fluid. IMPRESSION: 1. Possible 1.7 cm endometrial lesion versus subserosal fibroid, as discussed above. Correlation with nonemergent sonohysterography is suggested in the near future to better evaluate this finding. Electronically Signed   By: Vinnie Langton M.D.   On: 12/20/2019 07:36     Hospital Course: Admitted with bleeding. S/p Minerva ablation. Symptomatic anemia, with hgb of 7.9. Received 2 u PRBCs. Hgb rose appropriately. Pelvic sonogram shows probable submucosal fibroid. Given double dose of Megace and Lysteda and bleeding slowed. Felt to be stable for discharge.  Disposition: Discharge disposition: 01-Home or Self Care      Discharged Condition: fair  Discharge Instructions    Call MD for:  difficulty breathing, headache or visual disturbances   Complete by: As directed    Call MD for:  temperature >100.4   Complete by: As directed    Diet - low sodium heart healthy  Complete by: As directed    Diet Carb Modified   Complete by: As directed    Increase activity slowly   Complete by: As directed      Allergies as of 12/20/2019      Reactions   Ketoprofen Nausea And Vomiting   Aspirin Nausea Only   Gabapentin Nausea And Vomiting   upset stomach   Ibuprofen Nausea And Vomiting   Liraglutide Nausea And Vomiting    Naproxen Nausea And Vomiting   Omeprazole-sodium Bicarbonate Nausea And Vomiting   Sulfa Antibiotics Nausea And Vomiting   Tramadol Nausea And Vomiting   stomach upset      Medication List    TAKE these medications   albuterol (2.5 MG/3ML) 0.083% nebulizer solution Commonly known as: PROVENTIL Take 3 mLs (2.5 mg total) by nebulization every 6 (six) hours as needed for wheezing or shortness of breath.   albuterol 108 (90 Base) MCG/ACT inhaler Commonly known as: VENTOLIN HFA Inhale 2 puffs into the lungs every 6 (six) hours as needed for wheezing or shortness of breath.   Breo Ellipta 200-25 MCG/INH Aepb Generic drug: fluticasone furoate-vilanterol Inhale 1 puff into the lungs daily.   budesonide-formoterol 160-4.5 MCG/ACT inhaler Commonly known as: Symbicort INHALE 2 PUFFS INTO THE LUNGS 2 (TWO) TIMES DAILY. What changed:   how much to take  how to take this  when to take this  additional instructions   cetirizine 10 MG tablet Commonly known as: ZYRTEC Take 1 tablet (10 mg total) by mouth daily.   ferrous sulfate 325 (65 FE) MG tablet Take 1 tablet (325 mg total) by mouth 3 (three) times daily with meals.   fluticasone 50 MCG/ACT nasal spray Commonly known as: FLONASE Place 2 sprays into both nostrils daily.   furosemide 80 MG tablet Commonly known as: LASIX Take 1 tablet (80 mg total) by mouth daily.   Glucosamine Sulfate 1000 MG Caps Take 1 capsule (1,000 mg total) by mouth 2 (two) times daily.   glucose blood test strip Commonly known as: True Metrix Blood Glucose Test Use as instructed   hydrocortisone 2.5 % cream Apply topically 2 (two) times daily.   Insulin Lispro Prot & Lispro (75-25) 100 UNIT/ML Kwikpen Commonly known as: HumaLOG Mix 75/25 KwikPen INJECT 100 UNITS EVERY 12 HOURS What changed:   how much to take  how to take this  when to take this  additional instructions   Lido-Capsaicin-Men-Methyl Sal 0.5-0.035-5-20 % Ptch Apply 1  patch topically daily.   Lidocaine-Capsaicin 4-0.1 % Crea Apply 1 application topically 4 (four) times daily as needed.   losartan 100 MG tablet Commonly known as: COZAAR Take 1 tablet (100 mg total) by mouth daily.   megestrol 40 MG tablet Commonly known as: MEGACE Take 2 tablets (80 mg total) by mouth 2 (two) times daily. What changed: how much to take   metoprolol succinate 50 MG 24 hr tablet Commonly known as: Toprol XL Take 1 tablet (50 mg total) by mouth daily. Take with or immediately following a meal.   omeprazole 40 MG capsule Commonly known as: PRILOSEC Take 1 capsule (40 mg total) by mouth daily.   Onglyza 2.5 MG Tabs tablet Generic drug: saxagliptin HCl Take 1 tablet (2.5 mg total) by mouth daily.   Pen Needles 30G X 5 MM Misc 1 Units by Does not apply route as directed.   potassium chloride SA 20 MEQ tablet Commonly known as: KLOR-CON Take 1 tablet (20 mEq total) by mouth daily.  tizanidine 6 MG capsule Commonly known as: ZANAFLEX Take 1 capsule (6 mg total) by mouth 3 (three) times daily.   tranexamic acid 650 MG Tabs tablet Commonly known as: LYSTEDA Take 2 tablets (1,300 mg total) by mouth 3 (three) times daily.   True Metrix Meter w/Device Kit 1 each by Does not apply route 4 (four) times daily -  before meals and at bedtime.   Turmeric 500 MG Caps Take 500 mg by mouth 2 (two) times daily.   Vitamin D (Ergocalciferol) 1.25 MG (50000 UNIT) Caps capsule Commonly known as: DRISDOL Take 1 capsule (50,000 Units total) by mouth every 7 (seven) days. What changed: additional instructions   Vitamin D-3 125 MCG (5000 UT) Tabs Take 1 tablet by mouth daily. What changed: how much to take      [place] [facility] Follow up in 1 week(s).   Specialty: Obstetrics and Gynecology Why: They will call you with an appointment. Contact information: [address] [place], [place] [id number] [place] [id number] [phone]          Signed: Donnamae Jude 12/20/2019, 10:23 AM

## 2019-12-20 NOTE — Progress Notes (Signed)
Patient's blood sugar at 2208 is 417, notified MD on call Dr. Despina Hidden and was given verbal order for novolog 20 units, please see MAR.

## 2019-12-20 NOTE — Progress Notes (Signed)
Pt is discharged to go home.  VS WNL, CBG 401.  MD notified.  Per MD ok for discharge.  Discharge instructions, match letter,and prescription given.  All questions answered.

## 2019-12-20 NOTE — Care Management (Signed)
Patient given MATCH letter.  Patient has one printed script and list of MATCH pharmacies. One script sent to [facility].

## 2019-12-22 ENCOUNTER — Telehealth: Payer: Self-pay | Admitting: Family Medicine

## 2019-12-22 MED FILL — MEGESTROL 40 MG TABLET: 40 | 30 days supply | Qty: 120 | Fill #0

## 2019-12-22 MED FILL — tiZANidine HCL 6 MG CAPS: 6 | 30 days supply | Qty: 90 | Fill #1

## 2019-12-22 NOTE — Telephone Encounter (Signed)
-----   Message from Reva Bores, MD sent at 12/20/2019 10:23 AM EST ----- Needs hosp f/u this week, with attending please.

## 2019-12-22 NOTE — Telephone Encounter (Signed)
Called patient and left a message she has an appointment scheduled on 12/24/19 at 2:30 pm.

## 2019-12-24 ENCOUNTER — Telehealth: Payer: Self-pay | Admitting: Nurse Practitioner

## 2019-12-24 ENCOUNTER — Ambulatory Visit (INDEPENDENT_AMBULATORY_CARE_PROVIDER_SITE_OTHER): Payer: Self-pay | Admitting: Obstetrics and Gynecology

## 2019-12-24 ENCOUNTER — Encounter: Payer: Self-pay | Admitting: Obstetrics and Gynecology

## 2019-12-24 ENCOUNTER — Other Ambulatory Visit: Payer: Self-pay

## 2019-12-24 VITALS — BP 204/77 | HR 101 | Wt 286.0 lb

## 2019-12-24 DIAGNOSIS — Z8742 Personal history of other diseases of the female genital tract: Secondary | ICD-10-CM

## 2019-12-24 DIAGNOSIS — I10 Essential (primary) hypertension: Secondary | ICD-10-CM

## 2019-12-24 LAB — GC/CHLAMYDIA PROBE AMP (~~LOC~~) NOT AT ARMC
Chlamydia: NEGATIVE
Neisseria Gonorrhea: NEGATIVE

## 2019-12-24 NOTE — Telephone Encounter (Signed)
Pt called wanting to speak to crystal. Please call pt back.

## 2019-12-24 NOTE — Progress Notes (Signed)
States BP is always High is taking losartan 100mg 

## 2019-12-24 NOTE — Patient Instructions (Signed)
Call Thad Ranger, NP at [phone] regarding your blood pressure.

## 2019-12-24 NOTE — Telephone Encounter (Signed)
Called patient, she states she was seen an ob/gyn today and bp was running high. Their office asked her to make an appointment with [place] for follow up. I have scheduled an appointment for follow up on 12/26/2019. I advised her if bp is staying elevated or she is having and SOB/chest pain, vision changes, or headache she should go to ER immediately. Patient verbalized understanding. Thanks!

## 2019-12-24 NOTE — Progress Notes (Signed)
Obstetrics and Gynecology Established Patient Evaluation  Appointment Date: 12/24/2019  OBGYN Clinic: [facility] (Dr. Hulan Fray)  Primary Care Provider: Vevelyn Francois  Referring Provider: Vevelyn Francois, NP  Chief Complaint:  Chief Complaint  Patient presents with  . Follow-up    History of Present Illness: Jill Shaw is a 44 y.o. African-American G5P3 (No LMP recorded.), seen for the above chief complaint.   Patient was inpatient from 3/5-3/6 on the GYN service for ?symptomatic anemia. She receivedtwo units PRBCs and u/s showed 11.7cm uterus with 56m ES and ?1.7 endometrial lesion vs subserosal fibroid. She was discharged to home on megace 40 bid and lysteda and her Hgb rose to 3.6 from 7.9. she states the VB stopped the day after discharge and she is continuing to take her megace and lysteda.   She denies any HA, chest pain, sob, visual changes  Review of Systems: Pertinent items are noted in HPI.   Patient Active Problem List   Diagnosis Date Noted  . Symptomatic anemia 12/19/2019  . Elevated sed rate 11/26/2019  . Elevated C-reactive protein (CRP) 11/26/2019  . Hypoglycemia 02/07/2019  . Hypotension 02/07/2019  . Lactic acidosis 02/07/2019  . Right ankle pain 02/07/2019  . Chronic obstructive pulmonary disease ([place] 02/05/2019  . Seizure ([facility] 07/31/2018  . Vitamin D deficiency 05/31/2017  . Gastroesophageal reflux disease 05/24/2017  . Abnormal uterine bleeding (AUB) 05/17/2017  . Anemia of chronic disease 04/06/2017  . Knee pain, chronic 03/27/2016  . Type 2 diabetes mellitus without complication, with long-term current use of insulin ([place] 03/27/2016  . Essential hypertension 03/27/2016  . Morbid obesity ([facility] 03/27/2016  . Irritable bowel syndrome with constipation 03/27/2016  . Tobacco dependence 03/27/2016  . CKD (chronic kidney disease) stage 2, GFR 60-89 ml/min 03/25/2016  . Arthropathy, lower leg 05/04/2013  . CTS (carpal tunnel  syndrome) 05/04/2013  . Peripheral edema 05/04/2013  . Chronic pain 05/13/2012  . Diabetic gastroparesis ([facility] 11/06/2006  . Hot flashes 11/06/2006     Past Medical History:  Past Medical History:  Diagnosis Date  . Anemia   . Arthritis    knees, hands  . Asthma   . COPD (chronic obstructive pulmonary disease) ([facility]   . Diabetes mellitus without complication ([place]    type 2  . Dysfunctional uterine bleeding   . GERD (gastroesophageal reflux disease)   . Hypertension   . Neuromuscular disorder (HCC)    neuropathy feet  . Seizures ([facility] 09/12/2017   pt states r/t stress and blood sugar - no meds last one 4 months ago, not seen neurologist  . Sickle cell trait (HW[place]   . Smoker   . Vitamin D deficiency 10/2019  . Wears glasses     Past Surgical History:  Past Surgical History:  Procedure Laterality Date  . CESAREAN SECTION     x 1  . DILATION AND CURETTAGE OF UTERUS N/A 08/20/2019   Procedure: DILATATION AND CURETTAGE;  Surgeon: DEmily Filbert MD;  [facility]  Service: Gynecology;  Laterality: N/A;  . ENDOMETRIAL ABLATION N/A 08/20/2019   Procedure: Minerva Ablation;  Surgeon: DEmily Filbert MD;  [facility]  Service: Gynecology;  Laterality: N/A;  . EYE SURGERY Bilateral    laser right and cataract removed left eye  . RADIOLOGY WITH ANESTHESIA N/A 09/16/2019   Procedure: MRI WITH ANESTHESIA   L SPINE WITHOUT CONTRAST, T SPINE WITHOUT CONTRAST , CERVICAL WITHOUT CONTRAST;  Surgeon: Radiologist, Medication, MD;  [facility];  Service: Radiology;  Laterality: N/A;  . TUBAL LIGATION    . UPPER GI ENDOSCOPY  07/2017    Past Obstetrical History:  OB History  Gravida Para Term Preterm AB Living  5         3  SAB TAB Ectopic Multiple Live Births               # Outcome Date GA Lbr Len/2nd Weight Sex Delivery Anes PTL Lv  5 Gravida           4 Gravida           3 Gravida           2 Gravida           1 [place]             Past Gynecological History: As per  HPI. 08/2019: d&c negative pathology History of Pap Smear(s): Yes.   Last pap 2018, which was negative and hpv negative  Social History:  Social History   Socioeconomic History  . Marital status: Legally Separated    Spouse name: Not on file  . Number of children: Not on file  . Years of education: Not on file  . Highest education level: Not on file  Occupational History  . Not on file  Tobacco Use  . Smoking status: Current Every Day Smoker    Packs/day: 0.50    Years: 15.00    Pack years: 7.50    Types: Cigarettes  . Smokeless tobacco: Never Used  Substance and Sexual Activity  . Alcohol use: No  . Drug use: No  . Sexual activity: Not Currently    Birth control/protection: None  Other Topics Concern  . Not on file  Social History Narrative  . Not on file   Social Determinants of Health   Financial Resource Strain:   . Difficulty of Paying Living Expenses: Not on file  Food Insecurity:   . Worried About Charity fundraiser in the Last Year: Not on file  . Ran Out of Food in the Last Year: Not on file  Transportation Needs:   . Lack of Transportation (Medical): Not on file  . Lack of Transportation (Non-Medical): Not on file  Physical Activity:   . Days of Exercise per Week: Not on file  . Minutes of Exercise per Session: Not on file  Stress:   . Feeling of Stress : Not on file  Social Connections:   . Frequency of Communication with Friends and Family: Not on file  . Frequency of Social Gatherings with Friends and Family: Not on file  . Attends Religious Services: Not on file  . Active Member of Clubs or Organizations: Not on file  . Attends Archivist Meetings: Not on file  . Marital Status: Not on file  Intimate Partner Violence:   . Fear of Current or Ex-Partner: Not on file  . Emotionally Abused: Not on file  . Physically Abused: Not on file  . Sexually Abused: Not on file    Family History:  Family History  Problem Relation Age of Onset   . Diabetes Mother   . Hypertension Mother     Medications [name] had no medications administered during this visit. Current Outpatient Medications  Medication Sig Dispense Refill  . albuterol (PROVENTIL) (2.5 MG/3ML) 0.083% nebulizer solution Take 3 mLs (2.5 mg total) by nebulization every 6 (six) hours as needed for wheezing or shortness of breath. 150 mL  3  . albuterol (VENTOLIN HFA) 108 (90 Base) MCG/ACT inhaler Inhale 2 puffs into the lungs every 6 (six) hours as needed for wheezing or shortness of breath. 8 g 3  . Blood Glucose Monitoring Suppl (TRUE METRIX METER) w/Device KIT 1 each by Does not apply route 4 (four) times daily -  before meals and at bedtime. 1 kit 0  . budesonide-formoterol (SYMBICORT) 160-4.5 MCG/ACT inhaler INHALE 2 PUFFS INTO THE LUNGS 2 (TWO) TIMES DAILY. (Patient taking differently: Inhale 2 puffs into the lungs 2 (two) times daily. ) 10.2 g 3  . cetirizine (ZYRTEC) 10 MG tablet Take 1 tablet (10 mg total) by mouth daily. 30 tablet 3  . Cholecalciferol (VITAMIN D-3) 125 MCG (5000 UT) TABS Take 1 tablet by mouth daily. (Patient taking differently: Take 5,000 Units by mouth daily. ) 90 tablet 3  . ferrous sulfate 325 (65 FE) MG tablet Take 1 tablet (325 mg total) by mouth 3 (three) times daily with meals. 30 tablet 3  . fluticasone (FLONASE) 50 MCG/ACT nasal spray Place 2 sprays into both nostrils daily. 16 g 6  . fluticasone furoate-vilanterol (BREO ELLIPTA) 200-25 MCG/INH AEPB Inhale 1 puff into the lungs daily. (Patient not taking: Reported on 12/20/2019) 14 each 0  . furosemide (LASIX) 80 MG tablet Take 1 tablet (80 mg total) by mouth daily. 30 tablet 3  . Glucosamine Sulfate 1000 MG CAPS Take 1 capsule (1,000 mg total) by mouth 2 (two) times daily. 180 capsule 3  . glucose blood (TRUE METRIX BLOOD GLUCOSE TEST) test strip Use as instructed 100 each 12  . hydrocortisone 2.5 % cream Apply topically 2 (two) times daily. 30 g 2  . Insulin Lispro Prot & Lispro  (HUMALOG MIX 75/25 KWIKPEN) (75-25) 100 UNIT/ML Kwikpen INJECT 100 UNITS EVERY 12 HOURS (Patient taking differently: Inject 100 Units into the skin every 12 (twelve) hours. ) 15 mL 11  . Insulin Pen Needle (PEN NEEDLES) 30G X 5 MM MISC 1 Units by Does not apply route as directed. 100 each 11  . Lido-Capsaicin-Men-Methyl Sal 0.5-0.035-5-20 % PTCH Apply 1 patch topically daily. 30 patch 2  . Lidocaine-Capsaicin 4-0.1 % CREA Apply 1 application topically 4 (four) times daily as needed. 30 g 2  . losartan (COZAAR) 100 MG tablet Take 1 tablet (100 mg total) by mouth daily. 30 tablet 3  . megestrol (MEGACE) 40 MG tablet Take 2 tablets (80 mg total) by mouth 2 (two) times daily. 120 tablet 2  . metoprolol succinate (TOPROL XL) 50 MG 24 hr tablet Take 1 tablet (50 mg total) by mouth daily. Take with or immediately following a meal. 30 tablet 3  . omeprazole (PRILOSEC) 40 MG capsule Take 1 capsule (40 mg total) by mouth daily. 30 capsule 3  . potassium chloride SA (KLOR-CON) 20 MEQ tablet Take 1 tablet (20 mEq total) by mouth daily. 30 tablet 3  . saxagliptin HCl (ONGLYZA) 2.5 MG TABS tablet Take 1 tablet (2.5 mg total) by mouth daily. 90 tablet 3  . tizanidine (ZANAFLEX) 6 MG capsule Take 1 capsule (6 mg total) by mouth 3 (three) times daily. 270 capsule 0  . tranexamic acid (LYSTEDA) 650 MG TABS tablet Take 2 tablets (1,300 mg total) by mouth 3 (three) times daily. 12 tablet 0  . Turmeric 500 MG CAPS Take 500 mg by mouth 2 (two) times daily. 180 capsule 3  . Vitamin D, Ergocalciferol, (DRISDOL) 1.25 MG (50000 UNIT) CAPS capsule Take 1 capsule (50,000 Units total) by  mouth every 7 (seven) days. (Patient taking differently: Take 50,000 Units by mouth every 7 (seven) days. Fridays) 5 capsule 6   No current facility-administered medications for this visit.    Allergies Ketoprofen, Aspirin, Gabapentin, Ibuprofen, Liraglutide, Naproxen, Omeprazole-sodium bicarbonate, Sulfa antibiotics, and  Tramadol   Physical Exam:  BP (!) 192/74   Pulse (!) 104   Wt 286 lb (129.7 kg)   BMI 49.09 kg/m  Body mass index is 49.09 kg/m. General appearance: Well nourished, well developed female in no acute distress.  Cardiovascular: normal s1 and s2.  No murmurs, rubs or gallops. Respiratory:  Clear to auscultation bilateral. Normal respiratory effort Neuro/Psych:  Normal mood and affect.  Skin:  Warm and dry.   Laboratory: as per hpi  Radiology: as per hpi  Assessment: HTN, patient is stable from a GYN perspective  Plan:  1. History of abnormal uterine bleeding I told her that her options are limited at this point. I recommend surgery as the last option given the only surgery left, if medications fail, would be a hysterectomy, which would be very risky for her, especially at this point. I told her to finish out the 5 day course of lysteda and continue on the bid megace indefinitely and will see her back in about 6weeks. If she is doing well, then can continue on the megace. Pt amenable to plan.   2. Essential hypertension Recommend pt call PCP (# given) and if can't get in contact with them to go to ED for evaluation  RTC 6weeks  Durene Romans MD Attending Center for [facility])

## 2019-12-25 ENCOUNTER — Telehealth: Payer: Self-pay | Admitting: Nurse Practitioner

## 2019-12-25 NOTE — Progress Notes (Signed)
Can you follow up with her and see how she is doing Thanks

## 2019-12-25 NOTE — Telephone Encounter (Signed)
I called the pt and reminded them of appointment

## 2019-12-25 NOTE — Progress Notes (Signed)
I spoke with [name] yesterday, She did tell me BP was high at GYN office and she said she refused to go to ER because she had just left the hospital . She denied having any headache, chest pain, sob, or vision changes. I have made her an appointment for follow up regarding this for 12/26/2019 and I advised her if bp stayed elevated or she had any of the symptoms above to go to ER ASAP.

## 2019-12-26 ENCOUNTER — Ambulatory Visit (INDEPENDENT_AMBULATORY_CARE_PROVIDER_SITE_OTHER): Payer: No Typology Code available for payment source | Admitting: Nurse Practitioner

## 2019-12-26 ENCOUNTER — Other Ambulatory Visit: Payer: Self-pay

## 2019-12-26 ENCOUNTER — Encounter: Payer: Self-pay | Admitting: Nurse Practitioner

## 2019-12-26 VITALS — BP 138/60 | HR 108 | Temp 98.8°F | Resp 18 | Ht 64.0 in | Wt 284.0 lb

## 2019-12-26 DIAGNOSIS — I119 Hypertensive heart disease without heart failure: Secondary | ICD-10-CM

## 2019-12-26 DIAGNOSIS — R000 Tachycardia, unspecified: Secondary | ICD-10-CM

## 2019-12-26 DIAGNOSIS — R609 Edema, unspecified: Secondary | ICD-10-CM

## 2019-12-26 DIAGNOSIS — N182 Chronic kidney disease, stage 2 (mild): Secondary | ICD-10-CM

## 2019-12-26 DIAGNOSIS — R0602 Shortness of breath: Secondary | ICD-10-CM

## 2019-12-26 DIAGNOSIS — D649 Anemia, unspecified: Secondary | ICD-10-CM

## 2019-12-26 DIAGNOSIS — H539 Unspecified visual disturbance: Secondary | ICD-10-CM

## 2019-12-26 DIAGNOSIS — J449 Chronic obstructive pulmonary disease, unspecified: Secondary | ICD-10-CM

## 2019-12-26 MED ORDER — SPIRONOLACTONE 25 MG PO TABS: 25.0000 mg | ORAL_TABLET | Freq: Every day | ORAL | 0 refills | Status: DC

## 2019-12-26 MED ORDER — METOPROLOL SUCCINATE ER 100 MG PO TB24: 100.0000 mg | ORAL_TABLET | Freq: Every day | ORAL | 3 refills | Status: DC

## 2019-12-26 MED FILL — METOPROLOL SUCCINATE ER 100: 100 | 90 days supply | Qty: 90 | Fill #0

## 2019-12-26 MED FILL — ?SPIRONOLACTONE 25 MG TABLE: 25 | 7 days supply | Qty: 7 | Fill #0

## 2019-12-26 NOTE — Patient Instructions (Signed)
Edema  Edema is when you have too much fluid in your body or under your skin. Edema may make your legs, feet, and ankles swell up. Swelling is also common in looser tissues, like around your eyes. This is a common condition. It gets more common as you get older. There are many possible causes of edema. Eating too much salt (sodium) and being on your feet or sitting for a long time can cause edema in your legs, feet, and ankles. Hot weather may make edema worse. Edema is usually painless. Your skin may look swollen or shiny. Follow these instructions at home:  Keep the swollen body part raised (elevated) above the level of your heart when you are sitting or lying down.  Do not sit still or stand for a long time.  Do not wear tight clothes. Do not wear garters on your upper legs.  Exercise your legs. This can help the swelling go down.  Wear elastic bandages or support stockings as told by your doctor.  Eat a low-salt (low-sodium) diet to reduce fluid as told by your doctor.  Depending on the cause of your swelling, you may need to limit how much fluid you drink (fluid restriction).  Take over-the-counter and prescription medicines only as told by your doctor. Contact a doctor if:  Treatment is not working.  You have heart, liver, or kidney disease and have symptoms of edema.  You have sudden and unexplained weight gain. Get help right away if:  You have shortness of breath or chest pain.  You cannot breathe when you lie down.  You have pain, redness, or warmth in the swollen areas.  You have heart, liver, or kidney disease and get edema all of a sudden.  You have a fever and your symptoms get worse all of a sudden. Summary  Edema is when you have too much fluid in your body or under your skin.  Edema may make your legs, feet, and ankles swell up. Swelling is also common in looser tissues, like around your eyes.  Raise (elevate) the swollen body part above the level of your  heart when you are sitting or lying down.  Follow your doctor's instructions about diet and how much fluid you can drink (fluid restriction). This information is not intended to replace advice given to you by your health care provider. Make sure you discuss any questions you have with your health care provider. Document Revised: 10/05/2017 Document Reviewed: 10/20/2016 Elsevier Patient Education  2020 [employer]  

## 2019-12-26 NOTE — Progress Notes (Addendum)
Established Patient Office Visit  Subjective:  Patient ID: Jill Shaw, female    DOB: [birth year]  Age: 44 y.o. MRN: [id number]  CC:  Chief Complaint  Patient presents with  . Hypertension  . Hospitalization Follow-up    amenia     HPI Jill Shaw presents for hospital follow up. She  has a past medical history of Anemia, Arthritis, Asthma, COPD (chronic obstructive pulmonary disease) ([place]), Diabetes mellitus without complication ([place]), Dysfunctional uterine bleeding, GERD (gastroesophageal reflux disease), Hypertension, Neuromuscular disorder ([place]), Seizures ([place]) (09/12/2017), Sickle cell trait ([place]), Smoker, Vitamin D deficiency (10/2019), and Wears glasses.  She is also following-up of elevated blood pressure.  She is not exercising and is not adherent to a low-salt diet.  Blood pressure is not well controlled at home. Cardiac symptoms: dyspnea, fatigue, irregular heart beat, lower extremity edema and tachypnea. Patient denies: chest pressure/discomfort, claudication, near-syncope and paroxysmal nocturnal dyspnea. Cardiovascular risk factors: diabetes mellitus, dyslipidemia, microalbuminuria, obesity (BMI >= 30 kg/m2), sedentary lifestyle and smoking/ tobacco exposure. Use of agents associated with hypertension: none. History of target organ damage: chronic kidney disease and heart failure.  Diabetes Mellitus Patient presents for follow up of diabetes. Current symptoms include: hyperglycemia and visual disturbances. Left eye visual disturbance have gradually worsened. Patient denies foot ulcerations, hypoglycemia , increased appetite, nausea, vomiting and weight loss. Evaluation to date has included: fasting blood sugar, fasting lipid panel, hemoglobin A1C and microalbuminuria.  Home sugars: BGs are running  consistent with Hgb A1C. Current treatment: Continued insulin which has been effective.   Anemia Patient presents for evaluation of anemia. Anemia was found by ER visit. It  has been present for 4 weeks. Associated signs & symptoms: Shortness of breath, tachypnea, Tachycardia fatigue and abnormal heavy menstrual bleeding. She admits that she continues on the Megace and Lysteda was started. She had one episode of breakthrough bleeding.    Past Medical History:  Diagnosis Date  . Anemia   . Arthritis    knees, hands  . Asthma   . COPD (chronic obstructive pulmonary disease) ([place])   . Diabetes mellitus without complication ([place])    type 2  . Dysfunctional uterine bleeding   . GERD (gastroesophageal reflux disease)   . Hypertension   . Neuromuscular disorder (HCC)    neuropathy feet  . Seizures ([place]) 09/12/2017   pt states r/t stress and blood sugar - no meds last one 4 months ago, not seen neurologist  . Sickle cell trait ([place])   . Smoker   . Vitamin D deficiency 10/2019  . Wears glasses     Past Surgical History:  Procedure Laterality Date  . CESAREAN SECTION     x 1  . DILATION AND CURETTAGE OF UTERUS N/A 08/20/2019   Procedure: DILATATION AND CURETTAGE;  Surgeon: Emily Filbert, MD;  Location: [place];  Service: Gynecology;  Laterality: N/A;  . ENDOMETRIAL ABLATION N/A 08/20/2019   Procedure: [place] Ablation;  Surgeon: Emily Filbert, MD;  Location: [place];  Service: Gynecology;  Laterality: N/A;  . EYE SURGERY Bilateral    laser right and cataract removed left eye  . RADIOLOGY WITH ANESTHESIA N/A 09/16/2019   Procedure: MRI WITH ANESTHESIA   L SPINE WITHOUT CONTRAST, T SPINE WITHOUT CONTRAST , CERVICAL WITHOUT CONTRAST;  Surgeon: Radiologist, Medication, MD;  Location: [facility];  Service: Radiology;  Laterality: N/A;  . TUBAL LIGATION    . UPPER GI ENDOSCOPY  07/2017    Family History  Problem Relation Age  of Onset  . Diabetes Mother   . Hypertension Mother     Social History   Socioeconomic History  . Marital status: Legally Separated    Spouse name: Not on file  . Number of children: Not on file  . Years of education: Not on file  . Highest  education level: Not on file  Occupational History  . Not on file  Tobacco Use  . Smoking status: Current Every Day Smoker    Packs/day: 0.50    Years: 15.00    Pack years: 7.50    Types: Cigarettes  . Smokeless tobacco: Never Used  Substance and Sexual Activity  . Alcohol use: No  . Drug use: No  . Sexual activity: Not Currently    Birth control/protection: None  Other Topics Concern  . Not on file  Social History Narrative  . Not on file   Social Determinants of Health   Financial Resource Strain:   . Difficulty of Paying Living Expenses:   Food Insecurity:   . Worried About Charity fundraiser in the Last Year:   . Arboriculturist in the Last Year:   Transportation Needs:   . Film/video editor (Medical):   Marland Kitchen Lack of Transportation (Non-Medical):   Physical Activity:   . Days of Exercise per Week:   . Minutes of Exercise per Session:   Stress:   . Feeling of Stress :   Social Connections:   . Frequency of Communication with Friends and Family:   . Frequency of Social Gatherings with Friends and Family:   . Attends Religious Services:   . Active Member of Clubs or Organizations:   . Attends Archivist Meetings:   Marland Kitchen Marital Status:   Intimate Partner Violence:   . Fear of Current or Ex-Partner:   . Emotionally Abused:   Marland Kitchen Physically Abused:   . Sexually Abused:     Outpatient Medications Prior to Visit  Medication Sig Dispense Refill  . albuterol (PROVENTIL) (2.5 MG/3ML) 0.083% nebulizer solution Take 3 mLs (2.5 mg total) by nebulization every 6 (six) hours as needed for wheezing or shortness of breath. 150 mL 3  . albuterol (VENTOLIN HFA) 108 (90 Base) MCG/ACT inhaler Inhale 2 puffs into the lungs every 6 (six) hours as needed for wheezing or shortness of breath. 8 g 3  . Blood Glucose Monitoring Suppl (TRUE METRIX METER) w/Device KIT 1 each by Does not apply route 4 (four) times daily -  before meals and at bedtime. 1 kit 0  .  budesonide-formoterol (SYMBICORT) 160-4.5 MCG/ACT inhaler INHALE 2 PUFFS INTO THE LUNGS 2 (TWO) TIMES DAILY. (Patient taking differently: Inhale 2 puffs into the lungs 2 (two) times daily. ) 10.2 g 3  . cetirizine (ZYRTEC) 10 MG tablet Take 1 tablet (10 mg total) by mouth daily. 30 tablet 3  . Cholecalciferol (VITAMIN D-3) 125 MCG (5000 UT) TABS Take 1 tablet by mouth daily. (Patient taking differently: Take 5,000 Units by mouth daily. ) 90 tablet 3  . ferrous sulfate 325 (65 FE) MG tablet Take 1 tablet (325 mg total) by mouth 3 (three) times daily with meals. 30 tablet 3  . fluticasone (FLONASE) 50 MCG/ACT nasal spray Place 2 sprays into both nostrils daily. 16 g 6  . furosemide (LASIX) 80 MG tablet Take 1 tablet (80 mg total) by mouth daily. 30 tablet 3  . Glucosamine Sulfate 1000 MG CAPS Take 1 capsule (1,000 mg total) by mouth 2 (two) times  daily. 180 capsule 3  . glucose blood (TRUE METRIX BLOOD GLUCOSE TEST) test strip Use as instructed 100 each 12  . hydrocortisone 2.5 % cream Apply topically 2 (two) times daily. 30 g 2  . Insulin Lispro Prot & Lispro (HUMALOG MIX 75/25 KWIKPEN) (75-25) 100 UNIT/ML Kwikpen INJECT 100 UNITS EVERY 12 HOURS (Patient taking differently: Inject 100 Units into the skin every 12 (twelve) hours. ) 15 mL 11  . Insulin Pen Needle (PEN NEEDLES) 30G X 5 MM MISC 1 Units by Does not apply route as directed. 100 each 11  . Lido-Capsaicin-Men-Methyl Sal 0.5-0.035-5-20 % PTCH Apply 1 patch topically daily. 30 patch 2  . Lidocaine-Capsaicin 4-0.1 % CREA Apply 1 application topically 4 (four) times daily as needed. 30 g 2  . losartan (COZAAR) 100 MG tablet Take 1 tablet (100 mg total) by mouth daily. 30 tablet 3  . megestrol (MEGACE) 40 MG tablet Take 2 tablets (80 mg total) by mouth 2 (two) times daily. 120 tablet 2  . omeprazole (PRILOSEC) 40 MG capsule Take 1 capsule (40 mg total) by mouth daily. 30 capsule 3  . potassium chloride SA (KLOR-CON) 20 MEQ tablet Take 1 tablet (20  mEq total) by mouth daily. 30 tablet 3  . saxagliptin HCl (ONGLYZA) 2.5 MG TABS tablet Take 1 tablet (2.5 mg total) by mouth daily. 90 tablet 3  . tizanidine (ZANAFLEX) 6 MG capsule Take 1 capsule (6 mg total) by mouth 3 (three) times daily. 270 capsule 0  . tranexamic acid (LYSTEDA) 650 MG TABS tablet Take 2 tablets (1,300 mg total) by mouth 3 (three) times daily. 12 tablet 0  . Turmeric 500 MG CAPS Take 500 mg by mouth 2 (two) times daily. 180 capsule 3  . Vitamin D, Ergocalciferol, (DRISDOL) 1.25 MG (50000 UNIT) CAPS capsule Take 1 capsule (50,000 Units total) by mouth every 7 (seven) days. (Patient taking differently: Take 50,000 Units by mouth every 7 (seven) days. Fridays) 5 capsule 6  . metoprolol succinate (TOPROL XL) 50 MG 24 hr tablet Take 1 tablet (50 mg total) by mouth daily. Take with or immediately following a meal. 30 tablet 3  . fluticasone furoate-vilanterol (BREO ELLIPTA) 200-25 MCG/INH AEPB Inhale 1 puff into the lungs daily. (Patient not taking: Reported on 12/20/2019) 14 each 0   No facility-administered medications prior to visit.    Allergies  Allergen Reactions  . Ketoprofen Nausea And Vomiting  . Aspirin Nausea Only  . Gabapentin Nausea And Vomiting    upset stomach  . Ibuprofen Nausea And Vomiting  . Liraglutide Nausea And Vomiting  . Naproxen Nausea And Vomiting  . Omeprazole-Sodium Bicarbonate Nausea And Vomiting  . Sulfa Antibiotics Nausea And Vomiting  . Tramadol Nausea And Vomiting    stomach upset    ROS Review of Systems  All other systems reviewed and are negative.     Objective:    Physical Exam  Constitutional: She is oriented to person, place, and time.  obese  HENT:  Head: Normocephalic.  Cardiovascular: Regular rhythm and normal heart sounds.  tachycardia  Pulmonary/Chest:  Tachypnea  Musculoskeletal:        General: Tenderness and edema present. Normal range of motion.     Cervical back: Normal range of motion.     Comments: BLE  1-2+ pitting edema up to her knees  Neurological: She is alert and oriented to person, place, and time.  Skin: Skin is warm and dry.  Psychiatric: She has a normal mood and affect.  Her behavior is normal. Judgment and thought content normal.    BP 138/60 (BP Location: Right Arm, Patient Position: Sitting, Cuff Size: Large) Comment: manually  Pulse (!) 108   Temp 98.8 F (37.1 C) (Oral)   Resp 18   Ht '5\' 4"'$  (1.626 m)   Wt 284 lb (128.8 kg)   LMP 12/22/2019   SpO2 100%   BMI 48.75 kg/m  Wt Readings from Last 3 Encounters:  12/26/19 284 lb (128.8 kg)  12/24/19 286 lb (129.7 kg)  12/19/19 270 lb (122.5 kg)     Health Maintenance Due  Topic Date Due  . FOOT EXAM  09/22/2017  . OPHTHALMOLOGY EXAM  11/16/2017    There are no preventive care reminders to display for this patient.  Lab Results  Component Value Date   TSH 3.100 11/05/2019   Lab Results  Component Value Date   WBC 10.0 12/26/2019   HGB 11.5 12/26/2019   HCT 38.4 12/26/2019   MCV 78 (L) 12/26/2019   PLT 252 12/26/2019   Lab Results  Component Value Date   NA 142 12/26/2019   K 3.9 12/26/2019   CO2 23 12/20/2019   GLUCOSE 197 (H) 12/26/2019   BUN 9 12/26/2019   CREATININE 1.19 (H) 12/26/2019   BILITOT 0.4 12/26/2019   ALKPHOS 115 12/26/2019   AST 160 (H) 12/26/2019   ALT 25 11/05/2019   PROT 7.0 12/26/2019   ALBUMIN 3.8 12/26/2019   CALCIUM 9.4 12/26/2019   ANIONGAP 11 12/20/2019   Lab Results  Component Value Date   CHOL 151 11/05/2019   Lab Results  Component Value Date   HDL 55 11/05/2019   Lab Results  Component Value Date   LDLCALC 63 11/05/2019   Lab Results  Component Value Date   TRIG 205 (H) 11/05/2019   Lab Results  Component Value Date   CHOLHDL 2.7 11/05/2019   Lab Results  Component Value Date   HGBA1C 8.1 (A) 11/05/2019      Assessment & Plan:   Problem List Items Addressed This Visit      High   CKD (chronic kidney disease) stage 2, GFR 60-89 ml/min -  Primary   Morbid obesity ([place])   Relevant Orders   Ambulatory referral to Cardiology     Unprioritized   Chronic obstructive pulmonary disease ([place])    Other Visit Diagnoses    Tachycardia       Metoprolol 50 mg daily discontinued we will start metoprolol 100 mg daily Cardiology referral   Relevant Orders   Ambulatory referral to Cardiology   Edema, unspecified type       Continue with current furosemide 80 mg daily dose Spironolactone 25 mg daily for 7 days instructed patient on risk associated with diuretics and renal function   Relevant Orders   Brain natriuretic peptide (Completed)   Comp. Metabolic Panel (12) (Completed)   Ambulatory referral to Cardiology   Comprehensive metabolic panel   Anemia, unspecified type       CBC pending   Relevant Orders   CBC with Differential/Platelet (Completed)   CBC   Shortness of breath       We will continue to evaluate BMP/BNP pending   Malignant hypertensive heart disease without congestive heart failure       Cardiology referral pending Metoprolol increased Diet and exercise encouraged   Relevant Medications   metoprolol succinate (TOPROL-XL) 100 MG 24 hr tablet   spironolactone (ALDACTONE) 25 MG tablet   Other Relevant Orders  Ambulatory referral to Cardiology   Comprehensive metabolic panel      Meds ordered this encounter  Medications  . metoprolol succinate (TOPROL-XL) 100 MG 24 hr tablet    Sig: Take 1 tablet (100 mg total) by mouth daily. Take with or immediately following a meal.    Dispense:  90 tablet    Refill:  3    Order Specific Question:   Supervising Provider    Answer:   Tresa Garter [id number]  . spironolactone (ALDACTONE) 25 MG tablet    Sig: Take 1 tablet (25 mg total) by mouth daily for 7 days.    Dispense:  7 tablet    Refill:  0    Order Specific Question:   Supervising Provider    Answer:   Tresa Garter [[id number]]    Follow-up: Return in about 2 months (around 02/25/2020).     Jill Francois, NP

## 2019-12-26 NOTE — Progress Notes (Signed)
Integrated Behavioral Health Case Management Referral Note  12/26/2019 Name: Jill Shaw MRN: [id number] DOB: March 18, 1976 Jill Shaw is a 44 y.o. year old female who sees Vevelyn Francois, NP for primary care. LCSW was consulted to assess patient's access to community resources and assist the patient with Financial Difficulties related to low income and lack of insurance coverage.  Assessment: Patient experiencing vision impairment. Patient does not have insurance coverage and is in need of eye exam. Patient previously had a [employer] [facility]) [employer] but it expired. She has a pending Medicaid application. Assisted patient in completing new orange card application today. If patient is approved for Medicaid, she will need to inform [facility] office of this change in insurance coverage. Also assisted in completing [employer] [place] application today. Submitted both applications today. Patient will submit supporting documentation to [facility] office.   Review of patient status, including review of consultants reports, relevant laboratory and other test results, and collaboration with appropriate care team members and the patient's provider was performed as part of comprehensive patient evaluation and provision of services.    SDOH (Social Determinants of Health) assessments performed: No    Outpatient Encounter Medications as of 12/26/2019  Medication Sig Note  . albuterol (PROVENTIL) (2.5 MG/3ML) 0.083% nebulizer solution Take 3 mLs (2.5 mg total) by nebulization every 6 (six) hours as needed for wheezing or shortness of breath.   Marland Kitchen albuterol (VENTOLIN HFA) 108 (90 Base) MCG/ACT inhaler Inhale 2 puffs into the lungs every 6 (six) hours as needed for wheezing or shortness of breath.   . Blood Glucose Monitoring Suppl (TRUE METRIX METER) w/Device KIT 1 each by Does not apply route 4 (four) times daily -  before meals and at bedtime.   . budesonide-formoterol (SYMBICORT) 160-4.5 MCG/ACT  inhaler INHALE 2 PUFFS INTO THE LUNGS 2 (TWO) TIMES DAILY. (Patient taking differently: Inhale 2 puffs into the lungs 2 (two) times daily. )   . cetirizine (ZYRTEC) 10 MG tablet Take 1 tablet (10 mg total) by mouth daily.   . Cholecalciferol (VITAMIN D-3) 125 MCG (5000 UT) TABS Take 1 tablet by mouth daily. (Patient taking differently: Take 5,000 Units by mouth daily. )   . ferrous sulfate 325 (65 FE) MG tablet Take 1 tablet (325 mg total) by mouth 3 (three) times daily with meals.   . fluticasone (FLONASE) 50 MCG/ACT nasal spray Place 2 sprays into both nostrils daily.   . furosemide (LASIX) 80 MG tablet Take 1 tablet (80 mg total) by mouth daily.   . Glucosamine Sulfate 1000 MG CAPS Take 1 capsule (1,000 mg total) by mouth 2 (two) times daily.   Marland Kitchen glucose blood (TRUE METRIX BLOOD GLUCOSE TEST) test strip Use as instructed   . hydrocortisone 2.5 % cream Apply topically 2 (two) times daily.   . Insulin Lispro Prot & Lispro (HUMALOG MIX 75/25 KWIKPEN) (75-25) 100 UNIT/ML Kwikpen INJECT 100 UNITS EVERY 12 HOURS (Patient taking differently: Inject 100 Units into the skin every 12 (twelve) hours. )   . Insulin Pen Needle (PEN NEEDLES) 30G X 5 MM MISC 1 Units by Does not apply route as directed.   . Lido-Capsaicin-Men-Methyl Sal 0.5-0.035-5-20 % PTCH Apply 1 patch topically daily.   . Lidocaine-Capsaicin 4-0.1 % CREA Apply 1 application topically 4 (four) times daily as needed.   Marland Kitchen losartan (COZAAR) 100 MG tablet Take 1 tablet (100 mg total) by mouth daily.   . megestrol (MEGACE) 40 MG tablet Take 2 tablets (80  mg total) by mouth 2 (two) times daily.   Marland Kitchen omeprazole (PRILOSEC) 40 MG capsule Take 1 capsule (40 mg total) by mouth daily.   . potassium chloride SA (KLOR-CON) 20 MEQ tablet Take 1 tablet (20 mEq total) by mouth daily.   . saxagliptin HCl (ONGLYZA) 2.5 MG TABS tablet Take 1 tablet (2.5 mg total) by mouth daily.   . tizanidine (ZANAFLEX) 6 MG capsule Take 1 capsule (6 mg total) by mouth 3  (three) times daily.   . tranexamic acid (LYSTEDA) 650 MG TABS tablet Take 2 tablets (1,300 mg total) by mouth 3 (three) times daily.   . Turmeric 500 MG CAPS Take 500 mg by mouth 2 (two) times daily.   . Vitamin D, Ergocalciferol, (DRISDOL) 1.25 MG (50000 UNIT) CAPS capsule Take 1 capsule (50,000 Units total) by mouth every 7 (seven) days. (Patient taking differently: Take 50,000 Units by mouth every 7 (seven) days. Fridays)   . [DISCONTINUED] metoprolol succinate (TOPROL XL) 50 MG 24 hr tablet Take 1 tablet (50 mg total) by mouth daily. Take with or immediately following a meal.   . fluticasone furoate-vilanterol (BREO ELLIPTA) 200-25 MCG/INH AEPB Inhale 1 puff into the lungs daily. (Patient not taking: Reported on 12/20/2019) 12/20/2019: Pt is supposed to be on either Symbicort or Breo Ellipta - depending which samples she has. Currently she is on Symbicort.  . metoprolol succinate (TOPROL-XL) 100 MG 24 hr tablet Take 1 tablet (100 mg total) by mouth daily. Take with or immediately following a meal.   . spironolactone (ALDACTONE) 25 MG tablet Take 1 tablet (25 mg total) by mouth daily for 7 days.    No facility-administered encounter medications on file as of 12/26/2019.    Goals Addressed   None      Follow up Plan: 1. CSW submitted [employer] and [employer] [place] applications today. Will support in needed follow up for these applications.  2. Patient will submit required supporting documentation to [facility].  Estanislado Emms, [facility] [phone]

## 2019-12-27 LAB — CBC WITH DIFFERENTIAL/PLATELET
Basophils Absolute: 0.1 10*3/uL (ref 0.0–0.2)
Basos: 1 %
EOS (ABSOLUTE): 0.3 10*3/uL (ref 0.0–0.4)
Eos: 3 %
Hematocrit: 38.4 % (ref 34.0–46.6)
Hemoglobin: 11.5 g/dL (ref 11.1–15.9)
Immature Grans (Abs): 0 10*3/uL (ref 0.0–0.1)
Immature Granulocytes: 0 %
Lymphocytes Absolute: 2 10*3/uL (ref 0.7–3.1)
Lymphs: 20 %
MCH: 23.3 pg — ABNORMAL LOW (ref 26.6–33.0)
MCHC: 29.9 g/dL — ABNORMAL LOW (ref 31.5–35.7)
MCV: 78 fL — ABNORMAL LOW (ref 79–97)
Monocytes Absolute: 0.5 10*3/uL (ref 0.1–0.9)
Monocytes: 5 %
Neutrophils Absolute: 7.1 10*3/uL — ABNORMAL HIGH (ref 1.4–7.0)
Neutrophils: 71 %
Platelets: 252 10*3/uL (ref 150–450)
RBC: 4.93 x10E6/uL (ref 3.77–5.28)
RDW: 21.7 % — ABNORMAL HIGH (ref 11.7–15.4)
WBC: 10 10*3/uL (ref 3.4–10.8)

## 2019-12-27 LAB — COMP. METABOLIC PANEL (12)
AST: 160 IU/L — ABNORMAL HIGH (ref 0–40)
Albumin/Globulin Ratio: 1.2 (ref 1.2–2.2)
Albumin: 3.8 g/dL (ref 3.8–4.8)
Alkaline Phosphatase: 115 IU/L (ref 39–117)
BUN/Creatinine Ratio: 8 — ABNORMAL LOW (ref 9–23)
BUN: 9 mg/dL (ref 6–24)
Bilirubin Total: 0.4 mg/dL (ref 0.0–1.2)
Calcium: 9.4 mg/dL (ref 8.7–10.2)
Chloride: 103 mmol/L (ref 96–106)
Creatinine, Ser: 1.19 mg/dL — ABNORMAL HIGH (ref 0.57–1.00)
GFR calc Af Amer: 65 mL/min/{1.73_m2} (ref >59)
GFR calc non Af Amer: 56 mL/min/{1.73_m2} — ABNORMAL LOW (ref >59)
Globulin, Total: 3.2 g/dL (ref 1.5–4.5)
Glucose: 197 mg/dL — ABNORMAL HIGH (ref 65–99)
Potassium: 3.9 mmol/L (ref 3.5–5.2)
Sodium: 142 mmol/L (ref 134–144)
Total Protein: 7 g/dL (ref 6.0–8.5)

## 2019-12-27 LAB — BRAIN NATRIURETIC PEPTIDE: BNP: 13.2 pg/mL (ref 0.0–100.0)

## 2019-12-29 ENCOUNTER — Telehealth (INDEPENDENT_AMBULATORY_CARE_PROVIDER_SITE_OTHER): Payer: No Typology Code available for payment source | Admitting: *Deleted

## 2019-12-29 DIAGNOSIS — N939 Abnormal uterine and vaginal bleeding, unspecified: Secondary | ICD-10-CM

## 2019-12-29 NOTE — Telephone Encounter (Signed)
Pt left VM message stating she would like to speak with someone about her bleeding. Please call back.

## 2019-12-29 NOTE — Telephone Encounter (Signed)
Called patient stating I am returning her phone call. Patient reports heavy bleeding off/on for the past few days since appointment. She is continuing to take the Megace 80mg  BID and has finished the Lysteda. Told patient I would reach out to Dr and let him know. Discussed we will call her back whenever we hear from him. Patient verbalized understanding.

## 2019-12-30 ENCOUNTER — Telehealth: Payer: Self-pay | Admitting: Clinical

## 2019-12-30 NOTE — Telephone Encounter (Signed)
Integrated Behavioral Health Note  Called patient to follow up on visit 12/26/19 during which CSW assisted with orange card application. Advised patient that she will actually need to complete the application process and application for [facility] financial assistance with the financial counselor at [facility] [facility]). Attempted to schedule a financial counseling appointment for patient at [facility], but schedule was not open, CSW was advised to call on Monday. Patient planning to pick up her meds at [facility] pharmacy today and will request an application packet while there. CSW will remain available to assist.  Abigail Butts, LCSW Patient Care Center [facility] [phone]

## 2019-12-31 ENCOUNTER — Other Ambulatory Visit: Payer: Self-pay | Admitting: Obstetrics and Gynecology

## 2019-12-31 ENCOUNTER — Telehealth: Payer: Self-pay | Admitting: *Deleted

## 2019-12-31 DIAGNOSIS — N939 Abnormal uterine and vaginal bleeding, unspecified: Secondary | ICD-10-CM

## 2019-12-31 MED ORDER — NORETHINDRONE ACETATE 5 MG PO TABS: 10.0000 mg | ORAL_TABLET | Freq: Three times a day (TID) | ORAL | 1 refills | Status: DC

## 2019-12-31 MED FILL — METOPROLOL SUCCINATE ER 100: 100 | 30 days supply | Qty: 30 | Fill #0

## 2019-12-31 MED FILL — NORETHINDRONE 5 MG TABLET: 5 | 15 days supply | Qty: 90 | Fill #0

## 2019-12-31 NOTE — Telephone Encounter (Addendum)
Pt left VM message stating that she spoke with someone 2 days ago. Please call back.   1640 I consulted with Dr. [name] and returned pt's call. I informed her that he would like her to begin taking a different medication to stop the bleeding. She should stop taking Megace once she receives the new medication and is able to take it. He will send prescription to her pharmacy. Also, he would like to see her in the office in 2 weeks. She will be called with an appointment. Pt voiced understanding of all information and instructions given.

## 2020-01-01 MED FILL — ?FUROSEMIDE 80MG TABLET: 80 | 30 days supply | Qty: 30 | Fill #1

## 2020-01-05 ENCOUNTER — Other Ambulatory Visit: Payer: Self-pay | Admitting: Obstetrics & Gynecology

## 2020-01-05 DIAGNOSIS — Z1231 Encounter for screening mammogram for malignant neoplasm of breast: Secondary | ICD-10-CM

## 2020-01-06 MED FILL — OMEPRAZOLE DR 40 MG CAPSULE: 40 | 30 days supply | Qty: 30 | Fill #0

## 2020-01-07 ENCOUNTER — Telehealth: Payer: Self-pay | Admitting: Nurse Practitioner

## 2020-01-07 NOTE — Telephone Encounter (Signed)
Please make her aware that she can not be on both at this time because it may cause too much strain on her kidneys.Marland Kitchen

## 2020-01-08 ENCOUNTER — Ambulatory Visit: Payer: No Typology Code available for payment source | Admitting: Cardiovascular Disease

## 2020-01-08 NOTE — Telephone Encounter (Signed)
Called, no answer. Left a message that she should not be on both medications due to too big of a strain on kidneys. Asked if she had any questions to call us back. Thanks!

## 2020-01-12 ENCOUNTER — Telehealth: Payer: Self-pay | Admitting: Clinical

## 2020-01-12 NOTE — Telephone Encounter (Signed)
Integrated Behavioral Health Note  Called [employer] [facility]) and scheduled financial counseling appointment for patient, for orange card and financial assistance. Called patient and advised patient of the appointment. Patient has the application packet and expressed understanding that she should bring supporting documents along with her application to the appointment on 02/02/20. CSW available for additional support if needed.  Abigail Butts, LCSW Patient Care Center [facility] [phone]

## 2020-01-14 ENCOUNTER — Ambulatory Visit: Payer: No Typology Code available for payment source | Admitting: Obstetrics and Gynecology

## 2020-01-19 MED FILL — LOSARTAN POTASSIUM 100 MG T: 100 | 30 days supply | Qty: 30 | Fill #1

## 2020-01-19 MED FILL — tiZANidine HCL 6 MG CAPS: 6 | 30 days supply | Qty: 90 | Fill #2

## 2020-01-21 ENCOUNTER — Telehealth: Payer: Self-pay | Admitting: Clinical

## 2020-01-21 NOTE — Telephone Encounter (Signed)
Integrated Behavioral Health Note  Patient returned CSW's call placed yesterday regarding [employer] [place] referral. Patient was approved for a free eye exam through the program. CSW advised patient of this and provided follow up instructions. Mailed patient's [employer] [place] approval letter to patient. Encouraged patient to follow up with CSW for additional needs or if in need of assistance with eyeglasses after the exam.   Abigail Butts, LCSW Patient Care Center [facility] [phone]

## 2020-01-22 ENCOUNTER — Telehealth: Payer: Self-pay | Admitting: General Practice

## 2020-01-22 NOTE — Telephone Encounter (Signed)
Patient called and left message on nurse voicemail line stating she is having bleeding and pain. She wants a medication for pain asap.   Called patient, no answer- left message to call us back if she still needs assistance.

## 2020-01-29 ENCOUNTER — Emergency Department (HOSPITAL_COMMUNITY): Payer: Medicaid Other

## 2020-01-29 ENCOUNTER — Observation Stay (HOSPITAL_COMMUNITY): Payer: Medicaid Other

## 2020-01-29 ENCOUNTER — Inpatient Hospital Stay (HOSPITAL_COMMUNITY)
Admission: EM | Admit: 2020-01-29 | Discharge: 2020-02-03 | DRG: 682 | Disposition: A | Discharge status: Home / Self Care | Payer: Medicaid Other | Attending: Internal Medicine | Admitting: Internal Medicine

## 2020-01-29 ENCOUNTER — Encounter (HOSPITAL_COMMUNITY): Payer: Self-pay | Admitting: Emergency Medicine

## 2020-01-29 DIAGNOSIS — R112 Nausea with vomiting, unspecified: Secondary | ICD-10-CM

## 2020-01-29 DIAGNOSIS — N182 Chronic kidney disease, stage 2 (mild): Secondary | ICD-10-CM | POA: Diagnosis present

## 2020-01-29 DIAGNOSIS — N926 Irregular menstruation, unspecified: Secondary | ICD-10-CM | POA: Diagnosis present

## 2020-01-29 DIAGNOSIS — E872 Acidosis, unspecified: Secondary | ICD-10-CM

## 2020-01-29 DIAGNOSIS — G8929 Other chronic pain: Secondary | ICD-10-CM | POA: Diagnosis present

## 2020-01-29 DIAGNOSIS — G40909 Epilepsy, unspecified, not intractable, without status epilepticus: Secondary | ICD-10-CM | POA: Diagnosis present

## 2020-01-29 DIAGNOSIS — Z6841 Body Mass Index (BMI) 40.0 and over, adult: Secondary | ICD-10-CM

## 2020-01-29 DIAGNOSIS — K3184 Gastroparesis: Secondary | ICD-10-CM

## 2020-01-29 DIAGNOSIS — M1990 Unspecified osteoarthritis, unspecified site: Secondary | ICD-10-CM | POA: Diagnosis present

## 2020-01-29 DIAGNOSIS — F17210 Nicotine dependence, cigarettes, uncomplicated: Secondary | ICD-10-CM | POA: Diagnosis present

## 2020-01-29 DIAGNOSIS — J449 Chronic obstructive pulmonary disease, unspecified: Secondary | ICD-10-CM | POA: Diagnosis present

## 2020-01-29 DIAGNOSIS — E1122 Type 2 diabetes mellitus with diabetic chronic kidney disease: Secondary | ICD-10-CM | POA: Diagnosis present

## 2020-01-29 DIAGNOSIS — R296 Repeated falls: Secondary | ICD-10-CM | POA: Diagnosis present

## 2020-01-29 DIAGNOSIS — R569 Unspecified convulsions: Secondary | ICD-10-CM

## 2020-01-29 DIAGNOSIS — R4182 Altered mental status, unspecified: Secondary | ICD-10-CM

## 2020-01-29 DIAGNOSIS — T510X4A Toxic effect of ethanol, undetermined, initial encounter: Secondary | ICD-10-CM

## 2020-01-29 DIAGNOSIS — E860 Dehydration: Secondary | ICD-10-CM | POA: Diagnosis present

## 2020-01-29 DIAGNOSIS — Z833 Family history of diabetes mellitus: Secondary | ICD-10-CM

## 2020-01-29 DIAGNOSIS — Z20822 Contact with and (suspected) exposure to covid-19: Secondary | ICD-10-CM | POA: Diagnosis present

## 2020-01-29 DIAGNOSIS — Z888 Allergy status to other drugs, medicaments and biological substances status: Secondary | ICD-10-CM

## 2020-01-29 DIAGNOSIS — E66813 Obesity, class 3: Secondary | ICD-10-CM | POA: Diagnosis present

## 2020-01-29 DIAGNOSIS — D573 Sickle-cell trait: Secondary | ICD-10-CM | POA: Diagnosis present

## 2020-01-29 DIAGNOSIS — F17200 Nicotine dependence, unspecified, uncomplicated: Secondary | ICD-10-CM | POA: Diagnosis present

## 2020-01-29 DIAGNOSIS — E876 Hypokalemia: Secondary | ICD-10-CM

## 2020-01-29 DIAGNOSIS — E559 Vitamin D deficiency, unspecified: Secondary | ICD-10-CM | POA: Diagnosis present

## 2020-01-29 DIAGNOSIS — I129 Hypertensive chronic kidney disease with stage 1 through stage 4 chronic kidney disease, or unspecified chronic kidney disease: Secondary | ICD-10-CM | POA: Diagnosis present

## 2020-01-29 DIAGNOSIS — G894 Chronic pain syndrome: Secondary | ICD-10-CM | POA: Diagnosis present

## 2020-01-29 DIAGNOSIS — Z7951 Long term (current) use of inhaled steroids: Secondary | ICD-10-CM

## 2020-01-29 DIAGNOSIS — Z79899 Other long term (current) drug therapy: Secondary | ICD-10-CM

## 2020-01-29 DIAGNOSIS — K219 Gastro-esophageal reflux disease without esophagitis: Secondary | ICD-10-CM | POA: Diagnosis present

## 2020-01-29 DIAGNOSIS — Z794 Long term (current) use of insulin: Secondary | ICD-10-CM

## 2020-01-29 DIAGNOSIS — Z8249 Family history of ischemic heart disease and other diseases of the circulatory system: Secondary | ICD-10-CM

## 2020-01-29 DIAGNOSIS — E119 Type 2 diabetes mellitus without complications: Secondary | ICD-10-CM

## 2020-01-29 DIAGNOSIS — E1136 Type 2 diabetes mellitus with diabetic cataract: Secondary | ICD-10-CM | POA: Diagnosis present

## 2020-01-29 DIAGNOSIS — I10 Essential (primary) hypertension: Secondary | ICD-10-CM | POA: Diagnosis present

## 2020-01-29 DIAGNOSIS — N179 Acute kidney failure, unspecified: Principal | ICD-10-CM | POA: Diagnosis present

## 2020-01-29 DIAGNOSIS — E1143 Type 2 diabetes mellitus with diabetic autonomic (poly)neuropathy: Secondary | ICD-10-CM

## 2020-01-29 DIAGNOSIS — G9341 Metabolic encephalopathy: Secondary | ICD-10-CM | POA: Diagnosis present

## 2020-01-29 DIAGNOSIS — R102 Pelvic and perineal pain: Secondary | ICD-10-CM | POA: Diagnosis not present

## 2020-01-29 DIAGNOSIS — Z882 Allergy status to sulfonamides status: Secondary | ICD-10-CM

## 2020-01-29 DIAGNOSIS — G9340 Encephalopathy, unspecified: Secondary | ICD-10-CM | POA: Diagnosis present

## 2020-01-29 LAB — POCT I-STAT EG7
Acid-base deficit: 2 mmol/L (ref 0.0–2.0)
Acid-base deficit: 2 mmol/L (ref 0.0–2.0)
Bicarbonate: 22.3 mmol/L (ref 20.0–28.0)
Bicarbonate: 22.8 mmol/L (ref 20.0–28.0)
Calcium, Ion: 1.02 mmol/L — ABNORMAL LOW (ref 1.15–1.40)
Calcium, Ion: 1.08 mmol/L — ABNORMAL LOW (ref 1.15–1.40)
HCT: 36 % (ref 36.0–46.0)
HCT: 37 % (ref 36.0–46.0)
Hemoglobin: 12.2 g/dL (ref 12.0–15.0)
Hemoglobin: 12.6 g/dL (ref 12.0–15.0)
O2 Saturation: 98 %
O2 Saturation: 74 %
Potassium: 2.6 mmol/L — CL (ref 3.5–5.1)
Potassium: 3 mmol/L — ABNORMAL LOW (ref 3.5–5.1)
Sodium: 142 mmol/L (ref 135–145)
Sodium: 142 mmol/L (ref 135–145)
TCO2: 23 mmol/L (ref 22–32)
TCO2: 24 mmol/L (ref 22–32)
pCO2, Ven: 33.8 mmHg — ABNORMAL LOW (ref 44.0–60.0)
pCO2, Ven: 36.9 mmHg — ABNORMAL LOW (ref 44.0–60.0)
pH, Ven: 7.426 (ref 7.250–7.430)
pH, Ven: 7.399 (ref 7.250–7.430)
pO2, Ven: 94 mmHg — ABNORMAL HIGH (ref 32.0–45.0)
pO2, Ven: 39 mmHg (ref 32.0–45.0)

## 2020-01-29 LAB — COMPREHENSIVE METABOLIC PANEL
ALT: 12 U/L (ref 0–44)
AST: 36 U/L (ref 15–41)
Albumin: 3.2 g/dL — ABNORMAL LOW (ref 3.5–5.0)
Alkaline Phosphatase: 67 U/L (ref 38–126)
Anion gap: 17 — ABNORMAL HIGH (ref 5–15)
BUN: 15 mg/dL (ref 6–20)
CO2: 23 mmol/L (ref 22–32)
Calcium: 9 mg/dL (ref 8.9–10.3)
Chloride: 100 mmol/L (ref 98–111)
Creatinine, Ser: 1.91 mg/dL — ABNORMAL HIGH (ref 0.44–1.00)
GFR calc Af Amer: 37 mL/min — ABNORMAL LOW (ref >60)
GFR calc non Af Amer: 32 mL/min — ABNORMAL LOW (ref >60)
Glucose, Bld: 186 mg/dL — ABNORMAL HIGH (ref 70–99)
Potassium: 2.7 mmol/L — CL (ref 3.5–5.1)
Sodium: 140 mmol/L (ref 135–145)
Total Bilirubin: 0.7 mg/dL (ref 0.3–1.2)
Total Protein: 6.6 g/dL (ref 6.5–8.1)

## 2020-01-29 LAB — URINALYSIS, ROUTINE W REFLEX MICROSCOPIC
Bilirubin Urine: NEGATIVE
Glucose, UA: NEGATIVE mg/dL
Ketones, ur: NEGATIVE mg/dL
Nitrite: NEGATIVE
Protein, ur: NEGATIVE mg/dL
Specific Gravity, Urine: 1.003 — ABNORMAL LOW (ref 1.005–1.030)
pH: 6 (ref 5.0–8.0)

## 2020-01-29 LAB — CBC WITH DIFFERENTIAL/PLATELET
Abs Immature Granulocytes: 0.05 10*3/uL (ref 0.00–0.07)
Basophils Absolute: 0.1 10*3/uL (ref 0.0–0.1)
Basophils Relative: 0 %
Eosinophils Absolute: 0.4 10*3/uL (ref 0.0–0.5)
Eosinophils Relative: 4 %
HCT: 35 % — ABNORMAL LOW (ref 36.0–46.0)
Hemoglobin: 10.7 g/dL — ABNORMAL LOW (ref 12.0–15.0)
Immature Granulocytes: 0 %
Lymphocytes Relative: 24 %
Lymphs Abs: 2.7 10*3/uL (ref 0.7–4.0)
MCH: 24.3 pg — ABNORMAL LOW (ref 26.0–34.0)
MCHC: 30.6 g/dL (ref 30.0–36.0)
MCV: 79.4 fL — ABNORMAL LOW (ref 80.0–100.0)
Monocytes Absolute: 0.6 10*3/uL (ref 0.1–1.0)
Monocytes Relative: 5 %
Neutro Abs: 7.6 10*3/uL (ref 1.7–7.7)
Neutrophils Relative %: 67 %
Platelets: 272 10*3/uL (ref 150–400)
RBC: 4.41 MIL/uL (ref 3.87–5.11)
RDW: 22.9 % — ABNORMAL HIGH (ref 11.5–15.5)
WBC: 11.5 10*3/uL — ABNORMAL HIGH (ref 4.0–10.5)
nRBC: 0 % (ref 0.0–0.2)

## 2020-01-29 LAB — RAPID URINE DRUG SCREEN, HOSP PERFORMED
Amphetamines: NOT DETECTED
Barbiturates: NOT DETECTED
Benzodiazepines: NOT DETECTED
Cocaine: NOT DETECTED
Opiates: NOT DETECTED
Tetrahydrocannabinol: NOT DETECTED

## 2020-01-29 LAB — LACTIC ACID, PLASMA
Lactic Acid, Venous: 4.4 mmol/L (ref 0.5–1.9)
Lactic Acid, Venous: 3.3 mmol/L (ref 0.5–1.9)
Lactic Acid, Venous: 2.4 mmol/L (ref 0.5–1.9)
Lactic Acid, Venous: 2.4 mmol/L (ref 0.5–1.9)
Lactic Acid, Venous: 3.1 mmol/L (ref 0.5–1.9)

## 2020-01-29 LAB — RESPIRATORY PANEL BY RT PCR (FLU A&B, COVID)
Influenza A by PCR: NEGATIVE
Influenza B by PCR: NEGATIVE
SARS Coronavirus 2 by RT PCR: NEGATIVE

## 2020-01-29 LAB — I-STAT BETA HCG BLOOD, ED (MC, WL, AP ONLY): I-stat hCG, quantitative: <5 m[IU]/mL (ref <5)

## 2020-01-29 LAB — GLUCOSE, CAPILLARY
Glucose-Capillary: 129 mg/dL — ABNORMAL HIGH (ref 70–99)
Glucose-Capillary: 234 mg/dL — ABNORMAL HIGH (ref 70–99)

## 2020-01-29 LAB — CBG MONITORING, ED
Glucose-Capillary: 170 mg/dL — ABNORMAL HIGH (ref 70–99)
Glucose-Capillary: 189 mg/dL — ABNORMAL HIGH (ref 70–99)
Glucose-Capillary: 190 mg/dL — ABNORMAL HIGH (ref 70–99)

## 2020-01-29 LAB — SALICYLATE LEVEL: Salicylate Lvl: <7 mg/dL — ABNORMAL LOW (ref 7.0–30.0)

## 2020-01-29 LAB — MAGNESIUM: Magnesium: 1.6 mg/dL — ABNORMAL LOW (ref 1.7–2.4)

## 2020-01-29 LAB — HIV ANTIBODY (ROUTINE TESTING W REFLEX): HIV Screen 4th Generation wRfx: NONREACTIVE

## 2020-01-29 LAB — AMMONIA: Ammonia: 24 umol/L (ref 9–35)

## 2020-01-29 LAB — ETHANOL: Alcohol, Ethyl (B): 146 mg/dL — ABNORMAL HIGH (ref <10)

## 2020-01-29 LAB — ACETAMINOPHEN LEVEL: Acetaminophen (Tylenol), Serum: <10 ug/mL — ABNORMAL LOW (ref 10–30)

## 2020-01-29 MED ORDER — DOCUSATE SODIUM 100 MG PO CAPS
100.0000 mg | ORAL_CAPSULE | Freq: Two times a day (BID) | ORAL | Status: DC
  Administered 2020-01-29 – 2020-02-02 (×4): 100 mg via ORAL
  Filled 2020-01-29 (×9): qty 1

## 2020-01-29 MED ORDER — ENOXAPARIN SODIUM 40 MG/0.4ML ~~LOC~~ SOLN
40.0000 mg | SUBCUTANEOUS | Status: DC
  Administered 2020-01-29 – 2020-02-02 (×5): 40 mg via SUBCUTANEOUS
  Filled 2020-01-29 (×5): qty 0.4

## 2020-01-29 MED ORDER — BISACODYL 5 MG PO TBEC: 5.0000 mg | DELAYED_RELEASE_TABLET | Freq: Every day | ORAL | Status: DC | PRN

## 2020-01-29 MED ORDER — LACTATED RINGERS IV SOLN: INTRAVENOUS | Status: DC

## 2020-01-29 MED ORDER — PANTOPRAZOLE SODIUM 40 MG PO TBEC
40.0000 mg | DELAYED_RELEASE_TABLET | Freq: Every day | ORAL | Status: DC
  Administered 2020-01-29 – 2020-02-03 (×5): 40 mg via ORAL
  Filled 2020-01-29 (×5): qty 1

## 2020-01-29 MED ORDER — ONDANSETRON HCL 4 MG/2ML IJ SOLN
INTRAMUSCULAR | Status: AC
  Administered 2020-01-30: 4 mg via INTRAVENOUS
  Filled 2020-01-29: qty 2

## 2020-01-29 MED ORDER — NALOXONE HCL 2 MG/2ML IJ SOSY
2.0000 mg | PREFILLED_SYRINGE | Freq: Once | INTRAMUSCULAR | Status: AC
  Administered 2020-01-29: 2 mg via INTRAVENOUS

## 2020-01-29 MED ORDER — INSULIN ASPART 100 UNIT/ML ~~LOC~~ SOLN
0.0000 [IU] | Freq: Every day | SUBCUTANEOUS | Status: DC
  Administered 2020-01-29: 2 [IU] via SUBCUTANEOUS

## 2020-01-29 MED ORDER — FLUTICASONE PROPIONATE 50 MCG/ACT NA SUSP
2.0000 | Freq: Every day | NASAL | Status: DC
  Administered 2020-01-30 – 2020-02-03 (×4): 2 via NASAL
  Filled 2020-01-29: qty 16

## 2020-01-29 MED ORDER — LORAZEPAM 2 MG/ML IJ SOLN
1.0000 mg | Freq: Once | INTRAMUSCULAR | Status: AC
  Administered 2020-01-30: 1 mg via INTRAVENOUS
  Filled 2020-01-29: qty 1

## 2020-01-29 MED ORDER — METRONIDAZOLE IN NACL 5-0.79 MG/ML-% IV SOLN
500.0000 mg | Freq: Once | INTRAVENOUS | Status: AC
  Administered 2020-01-29: 500 mg via INTRAVENOUS
  Filled 2020-01-29: qty 100

## 2020-01-29 MED ORDER — POLYETHYLENE GLYCOL 3350 17 G PO PACK: 17.0000 g | PACK | Freq: Every day | ORAL | Status: DC | PRN

## 2020-01-29 MED ORDER — LORATADINE 10 MG PO TABS
10.0000 mg | ORAL_TABLET | Freq: Every day | ORAL | Status: DC
  Administered 2020-01-29 – 2020-02-03 (×4): 10 mg via ORAL
  Filled 2020-01-29 (×6): qty 1

## 2020-01-29 MED ORDER — BUTALBITAL-APAP-CAFFEINE 50-325-40 MG PO TABS
2.0000 | ORAL_TABLET | Freq: Once | ORAL | Status: AC
  Administered 2020-01-29: 2 via ORAL
  Filled 2020-01-29 (×2): qty 2

## 2020-01-29 MED ORDER — ALBUTEROL SULFATE (2.5 MG/3ML) 0.083% IN NEBU
2.5000 mg | INHALATION_SOLUTION | RESPIRATORY_TRACT | Status: DC | PRN
  Administered 2020-02-01: 2.5 mg via RESPIRATORY_TRACT
  Filled 2020-01-29: qty 3

## 2020-01-29 MED ORDER — NORETHINDRONE ACETATE 5 MG PO TABS
10.0000 mg | ORAL_TABLET | Freq: Three times a day (TID) | ORAL | Status: DC
  Administered 2020-01-29 – 2020-02-03 (×10): 10 mg via ORAL
  Filled 2020-01-29 (×19): qty 2

## 2020-01-29 MED ORDER — ONDANSETRON HCL 4 MG/2ML IJ SOLN
4.0000 mg | Freq: Four times a day (QID) | INTRAMUSCULAR | Status: DC | PRN
  Administered 2020-01-30 – 2020-02-01 (×4): 4 mg via INTRAVENOUS
  Filled 2020-01-29 (×5): qty 2

## 2020-01-29 MED ORDER — NALOXONE HCL 2 MG/2ML IJ SOSY
PREFILLED_SYRINGE | INTRAMUSCULAR | Status: AC
  Filled 2020-01-29: qty 2

## 2020-01-29 MED ORDER — MAGNESIUM SULFATE IN D5W 1-5 GM/100ML-% IV SOLN
1.0000 g | Freq: Once | INTRAVENOUS | Status: AC
  Administered 2020-01-29: 1 g via INTRAVENOUS
  Filled 2020-01-29: qty 100

## 2020-01-29 MED ORDER — ACETAMINOPHEN 325 MG PO TABS
650.0000 mg | ORAL_TABLET | Freq: Four times a day (QID) | ORAL | Status: DC | PRN
  Administered 2020-01-29 – 2020-01-31 (×5): 650 mg via ORAL
  Filled 2020-01-29 (×5): qty 2

## 2020-01-29 MED ORDER — POTASSIUM CHLORIDE CRYS ER 20 MEQ PO TBCR
20.0000 meq | EXTENDED_RELEASE_TABLET | Freq: Every day | ORAL | Status: DC
  Administered 2020-01-29: 20 meq via ORAL
  Filled 2020-01-29: qty 1

## 2020-01-29 MED ORDER — VANCOMYCIN HCL 2000 MG/400ML IV SOLN
2000.0000 mg | Freq: Once | INTRAVENOUS | Status: AC
  Administered 2020-01-29: 2000 mg via INTRAVENOUS
  Filled 2020-01-29: qty 400

## 2020-01-29 MED ORDER — ACETAMINOPHEN 650 MG RE SUPP: 650.0000 mg | Freq: Four times a day (QID) | RECTAL | Status: DC | PRN

## 2020-01-29 MED ORDER — INSULIN ASPART 100 UNIT/ML ~~LOC~~ SOLN
0.0000 [IU] | Freq: Three times a day (TID) | SUBCUTANEOUS | Status: DC
  Administered 2020-01-29: 4 [IU] via SUBCUTANEOUS
  Administered 2020-01-30 (×2): 15 [IU] via SUBCUTANEOUS
  Administered 2020-01-30: 7 [IU] via SUBCUTANEOUS
  Administered 2020-01-31: 3 [IU] via SUBCUTANEOUS
  Administered 2020-01-31: 7 [IU] via SUBCUTANEOUS
  Administered 2020-01-31: 3 [IU] via SUBCUTANEOUS
  Administered 2020-02-01: 4 [IU] via SUBCUTANEOUS
  Administered 2020-02-01 (×2): 7 [IU] via SUBCUTANEOUS
  Administered 2020-02-02 (×2): 4 [IU] via SUBCUTANEOUS
  Administered 2020-02-03: 7 [IU] via SUBCUTANEOUS

## 2020-01-29 MED ORDER — ONDANSETRON HCL 4 MG PO TABS: 4.0000 mg | ORAL_TABLET | Freq: Four times a day (QID) | ORAL | Status: DC | PRN

## 2020-01-29 MED ORDER — NICOTINE 14 MG/24HR TD PT24
14.0000 mg | MEDICATED_PATCH | Freq: Every day | TRANSDERMAL | Status: DC
  Administered 2020-01-29 – 2020-02-03 (×4): 14 mg via TRANSDERMAL
  Filled 2020-01-29 (×6): qty 1

## 2020-01-29 MED ORDER — SODIUM CHLORIDE 0.9 % IV SOLN
2.0000 g | Freq: Once | INTRAVENOUS | Status: AC
  Administered 2020-01-29: 2 g via INTRAVENOUS
  Filled 2020-01-29: qty 2

## 2020-01-29 MED ORDER — ONDANSETRON HCL 4 MG/2ML IJ SOLN
4.0000 mg | Freq: Once | INTRAMUSCULAR | Status: AC
  Administered 2020-01-29: 4 mg via INTRAVENOUS

## 2020-01-29 MED ORDER — GLUCAGON HCL RDNA (DIAGNOSTIC) 1 MG IJ SOLR
2.0000 mg | Freq: Once | INTRAMUSCULAR | Status: AC
  Administered 2020-01-29: 2 mg via INTRAVENOUS
  Filled 2020-01-29: qty 2

## 2020-01-29 MED ORDER — HYDRALAZINE HCL 20 MG/ML IJ SOLN
5.0000 mg | INTRAMUSCULAR | Status: DC | PRN
  Administered 2020-01-30: 5 mg via INTRAVENOUS
  Filled 2020-01-29: qty 1

## 2020-01-29 MED ORDER — INSULIN ASPART PROT & ASPART (70-30 MIX) 100 UNIT/ML ~~LOC~~ SUSP
100.0000 [IU] | Freq: Two times a day (BID) | SUBCUTANEOUS | Status: DC
  Administered 2020-01-29 – 2020-01-30 (×2): 100 [IU] via SUBCUTANEOUS
  Administered 2020-01-31: 20 [IU] via SUBCUTANEOUS
  Administered 2020-01-31: 100 [IU] via SUBCUTANEOUS
  Filled 2020-01-29 (×2): qty 10

## 2020-01-29 MED ORDER — VANCOMYCIN HCL IN DEXTROSE 1-5 GM/200ML-% IV SOLN: 1000.0000 mg | Freq: Once | INTRAVENOUS | Status: DC

## 2020-01-29 MED ORDER — NALOXONE HCL 2 MG/2ML IJ SOSY
PREFILLED_SYRINGE | INTRAMUSCULAR | Status: AC
  Administered 2020-01-29: 2 mg
  Filled 2020-01-29: qty 2

## 2020-01-29 MED ORDER — SODIUM CHLORIDE 0.9 % IV BOLUS
500.0000 mL | Freq: Once | INTRAVENOUS | Status: AC
  Administered 2020-01-29: 500 mL via INTRAVENOUS

## 2020-01-29 MED ORDER — MOMETASONE FURO-FORMOTEROL FUM 200-5 MCG/ACT IN AERO
2.0000 | INHALATION_SPRAY | Freq: Two times a day (BID) | RESPIRATORY_TRACT | Status: DC
  Administered 2020-01-29 – 2020-02-03 (×9): 2 via RESPIRATORY_TRACT
  Filled 2020-01-29: qty 8.8

## 2020-01-29 MED ORDER — POTASSIUM CHLORIDE 10 MEQ/100ML IV SOLN
10.0000 meq | INTRAVENOUS | Status: AC
  Administered 2020-01-29 (×2): 10 meq via INTRAVENOUS
  Filled 2020-01-29 (×2): qty 100

## 2020-01-29 MED ORDER — SODIUM CHLORIDE 0.9% FLUSH
3.0000 mL | Freq: Two times a day (BID) | INTRAVENOUS | Status: DC
  Administered 2020-01-30 – 2020-02-03 (×5): 3 mL via INTRAVENOUS

## 2020-01-29 NOTE — ED Notes (Signed)
Potassium is burning her arm  Slowed  For comfort

## 2020-01-29 NOTE — Progress Notes (Signed)
Sent transport for patient to come to MRI. PT refused stating that she will not come down without being sedated due to severe claustrophobia. PT also declined in the ER for same reason.

## 2020-01-29 NOTE — Progress Notes (Signed)
[facility] notified that patient is having 9/10 migraine headache with sensitivity to light. Tylenol 650 mg PO not effective. Ilean Skill LPN

## 2020-01-29 NOTE — Evaluation (Signed)
Physical Therapy Evaluation Patient Details Name: Jill Shaw MRN: [id number] DOB: [birth year] Today's Date: 01/29/2020   History of Present Illness  Pt is a 44 y/o female admitted secondary to AMS and hypotension. Found to have elevated ethanol level. CT negative for acute abnormality. PMH includes COPD, DM, CKD, obesity, HTN, seizures, and sickle cell trait.   Clinical Impression  Pt admitted secondary to problem above with deficits below. Pt reporting 10/10 headache upon sitting, so mobility limited. Pt requiring min A to perform bed mobility tasks. Pt's BP at 82/41 mmHg in supine and 96/54 mmHg in sitting. Reports feeling weaker in BLE (RLE>LLE). Will continue to follow acutely to maximize functional mobility independence and safety.     Follow Up Recommendations Other (comment);Supervision for mobility/OOB(TBD pending mobility progression )    Equipment Recommendations  Other (comment)(TBD)    Recommendations for Other Services       Precautions / Restrictions Precautions Precautions: Fall Restrictions Weight Bearing Restrictions: No      Mobility  Bed Mobility Overal bed mobility: Needs Assistance Bed Mobility: Supine to Sit;Sit to Supine     Supine to sit: Min assist Sit to supine: Min assist   General bed mobility comments: Min A for trunk assist and LE assist. Pt reporting increased headache upon sitting, therefore further mobility deferred. BP in supine at 82/41 mmHg and in sitting at 96/54 mmHg.   Transfers                 General transfer comment: Unable to attempt secondary to pain   Ambulation/Gait                Stairs            Wheelchair Mobility    Modified Rankin (Stroke Patients Only)       Balance Overall balance assessment: Needs assistance Sitting-balance support: Bilateral upper extremity supported;Feet supported Sitting balance-Leahy Scale: Fair                                       Pertinent  Vitals/Pain Pain Assessment: 0-10 Pain Score: 10-Worst pain ever Pain Location: headache upon sitting up.  Pain Descriptors / Indicators: Headache Pain Intervention(s): Monitored during session;Limited activity within patient's tolerance;Repositioned    Home Living Family/patient expects to be discharged to:: Private residence Living Arrangements: Children;Parent Available Help at Discharge: Family;Available 24 hours/day Type of Home: Apartment Home Access: Stairs to enter Entrance Stairs-Rails: Right Entrance Stairs-Number of Steps: flight Home Layout: One level Home Equipment: Walker - 2 wheels;Cane - single point      Prior Function Level of Independence: Independent with assistive device(s)         Comments: Reports using RW vs cane depending on the day.      Hand Dominance        Extremity/Trunk Assessment   Upper Extremity Assessment Upper Extremity Assessment: Defer to OT evaluation    Lower Extremity Assessment Lower Extremity Assessment: RLE deficits/detail;Generalized weakness RLE Deficits / Details: Reports RLE feeling weaker than LLE. Able to perform SLR on stretcher but increased shakiness noted in RLE with motion secondary to weakness.     Cervical / Trunk Assessment Cervical / Trunk Assessment: Normal  Communication   Communication: No difficulties  Cognition Arousal/Alertness: Awake/alert Behavior During Therapy: WFL for tasks assessed/performed Overall Cognitive Status: Within Functional Limits for tasks assessed  General Comments: Pt alert and oriented X4.       General Comments      Exercises     Assessment/Plan    PT Assessment Patient needs continued PT services  PT Problem List Decreased strength;Decreased balance;Decreased activity tolerance;Decreased mobility;Decreased knowledge of use of DME       PT Treatment Interventions DME instruction;Gait training;Stair training;Functional  mobility training;Therapeutic activities;Therapeutic exercise;Balance training;Patient/family education    PT Goals (Current goals can be found in the Care Plan section)  Acute Rehab PT Goals Patient Stated Goal: to feel better PT Goal Formulation: With patient Time For Goal Achievement: 02/12/20 Potential to Achieve Goals: Good    Frequency Min 3X/week   Barriers to discharge        Co-evaluation               AM-PAC PT "6 Clicks" Mobility  Outcome Measure Help needed turning from your back to your side while in a flat bed without using bedrails?: A Little Help needed moving from lying on your back to sitting on the side of a flat bed without using bedrails?: A Little Help needed moving to and from a bed to a chair (including a wheelchair)?: A Lot Help needed standing up from a chair using your arms (e.g., wheelchair or bedside chair)?: A Lot Help needed to walk in hospital room?: A Lot Help needed climbing 3-5 steps with a railing? : A Lot 6 Click Score: 14    End of Session   Activity Tolerance: Patient limited by pain Patient left: in bed;with call bell/phone within reach Nurse Communication: Mobility status PT Visit Diagnosis: Unsteadiness on feet (R26.81);Muscle weakness (generalized) (M62.81);Difficulty in walking, not elsewhere classified (R26.2)    Time: 4944-9675 PT Time Calculation (min) (ACUTE ONLY): 16 min   Charges:   PT Evaluation $PT Eval Moderate Complexity: 1 Mod          Jill Shaw, PT, DPT  Acute Rehabilitation Services  Pager: [phone] Office: [phone]   Jill Shaw 01/29/2020, 5:04 PM

## 2020-01-29 NOTE — ED Notes (Signed)
Pt still as asleep now as she was when she arrived

## 2020-01-29 NOTE — ED Provider Notes (Signed)
[facility] EMERGENCY DEPARTMENT Provider Note   CSN: [id number] Arrival date & time: 01/29/20  5176     History Chief Complaint  Patient presents with  . Altered Mental Status   Level 5 caveat due to altered mental status  Jill Shaw is a 44 y.o. female with history of COPD, diabetes mellitus, GERD, hypertension, seizures, sickle cell trait, CKD, obesity presenting brought in by EMS for evaluation of altered mental status.  Per EMS they received a call out from patient's mother for "possible diabetic emergency".  She was unresponsive upon EMS arrival.  On my assessment patient is obtunded, responsive to pain, pinpoint pupils bilaterally.  She will arouse very briefly with sternal rub and then fall back asleep.  She exhibits sonorous respirations but stable SPO2 saturations.  The decision was made to give IV Narcan.  The patient became more arousable.  She states that she fell twice last week.  She tells me that for the last 2 weeks she has had a frontal headache which worsens with position changes and makes it difficult for her to ambulate.  She tells me that she has been taking her Zanaflex 2-3 times daily and denies taking additional doses.  She denies taking any narcotic pain medicines.  Per EMS she reportedly drank several beers.   The history is provided by the patient.       Past Medical History:  Diagnosis Date  . Anemia   . Arthritis    knees, hands  . Asthma   . COPD (chronic obstructive pulmonary disease) ([place])   . Diabetes mellitus without complication ([place])    type 2  . Dysfunctional uterine bleeding   . GERD (gastroesophageal reflux disease)   . Hypertension   . Neuromuscular disorder (HCC)    neuropathy feet  . Seizures ([place]) 09/12/2017   pt states r/t stress and blood sugar - no meds last one 4 months ago, not seen neurologist  . Sickle cell trait ([place])   . Smoker   . Vitamin D deficiency 10/2019  . Wears glasses     Patient Active  Problem List   Diagnosis Date Noted  . Acute encephalopathy 01/29/2020  . Symptomatic anemia 12/19/2019  . Elevated sed rate 11/26/2019  . Elevated C-reactive protein (CRP) 11/26/2019  . Hypoglycemia 02/07/2019  . Hypotension 02/07/2019  . Lactic acidosis 02/07/2019  . Right ankle pain 02/07/2019  . Chronic obstructive pulmonary disease ([place]) 02/05/2019  . Seizure ([place]) 07/31/2018  . Vitamin D deficiency 05/31/2017  . Gastroesophageal reflux disease 05/24/2017  . Abnormal uterine bleeding (AUB) 05/17/2017  . Anemia of chronic disease 04/06/2017  . Knee pain, chronic 03/27/2016  . Type 2 diabetes mellitus without complication, with long-term current use of insulin ([place]) 03/27/2016  . Essential hypertension 03/27/2016  . Morbid obesity ([place]) 03/27/2016  . Irritable bowel syndrome with constipation 03/27/2016  . Tobacco dependence 03/27/2016  . CKD (chronic kidney disease) stage 2, GFR 60-89 ml/min 03/25/2016  . Arthropathy, lower leg 05/04/2013  . CTS (carpal tunnel syndrome) 05/04/2013  . Peripheral edema 05/04/2013  . Chronic pain 05/13/2012  . Diabetic gastroparesis ([place]) 11/06/2006  . Hot flashes 11/06/2006    Past Surgical History:  Procedure Laterality Date  . CESAREAN SECTION     x 1  . DILATION AND CURETTAGE OF UTERUS N/A 08/20/2019   Procedure: DILATATION AND CURETTAGE;  Surgeon: Emily Filbert, MD;  Location: [facility];  Service: Gynecology;  Laterality: N/A;  . ENDOMETRIAL ABLATION N/A 08/20/2019  Procedure: [place] Ablation;  Surgeon: Emily Filbert, MD;  Location: [facility];  Service: Gynecology;  Laterality: N/A;  . EYE SURGERY Bilateral    laser right and cataract removed left eye  . RADIOLOGY WITH ANESTHESIA N/A 09/16/2019   Procedure: MRI WITH ANESTHESIA   L SPINE WITHOUT CONTRAST, T SPINE WITHOUT CONTRAST , CERVICAL WITHOUT CONTRAST;  Surgeon: Radiologist, Medication, MD;  Location: [facility];  Service: Radiology;  Laterality: N/A;  . TUBAL LIGATION    . UPPER GI ENDOSCOPY   07/2017     OB History    Gravida  5   Para      Term      Preterm      AB      Living  3     SAB      TAB      Ectopic      Multiple      Live Births              Family History  Problem Relation Age of Onset  . Diabetes Mother   . Hypertension Mother     Social History   Tobacco Use  . Smoking status: Current Every Day Smoker    Packs/day: 0.50    Years: 15.00    Pack years: 7.50    Types: Cigarettes  . Smokeless tobacco: Never Used  Substance Use Topics  . Alcohol use: No  . Drug use: No    Home Medications Prior to Admission medications   Medication Sig Start Date End Date Taking? Authorizing Provider  albuterol (PROVENTIL) (2.5 MG/3ML) 0.083% nebulizer solution Take 3 mLs (2.5 mg total) by nebulization every 6 (six) hours as needed for wheezing or shortness of breath. 12/02/19   Vevelyn Francois, NP  albuterol (VENTOLIN HFA) 108 (90 Base) MCG/ACT inhaler Inhale 2 puffs into the lungs every 6 (six) hours as needed for wheezing or shortness of breath. 12/02/19   Vevelyn Francois, NP  Blood Glucose Monitoring Suppl (TRUE METRIX METER) w/Device KIT 1 each by Does not apply route 4 (four) times daily -  before meals and at bedtime. 01/21/18   Dorena Dew, FNP  budesonide-formoterol (SYMBICORT) 160-4.5 MCG/ACT inhaler INHALE 2 PUFFS INTO THE LUNGS 2 (TWO) TIMES DAILY. Patient taking differently: Inhale 2 puffs into the lungs 2 (two) times daily.  12/02/19   Vevelyn Francois, NP  cetirizine (ZYRTEC) 10 MG tablet Take 1 tablet (10 mg total) by mouth daily. 12/02/19   Vevelyn Francois, NP  Cholecalciferol (VITAMIN D-3) 125 MCG (5000 UT) TABS Take 1 tablet by mouth daily. Patient taking differently: Take 5,000 Units by mouth daily.  08/22/19   Hilts, Legrand Como, MD  ferrous sulfate 325 (65 FE) MG tablet Take 1 tablet (325 mg total) by mouth 3 (three) times daily with meals. 12/02/19   Vevelyn Francois, NP  fluticasone (FLONASE) 50 MCG/ACT nasal spray Place 2 sprays  into both nostrils daily. 11/24/19   Vevelyn Francois, NP  fluticasone furoate-vilanterol (BREO ELLIPTA) 200-25 MCG/INH AEPB Inhale 1 puff into the lungs daily. Patient not taking: Reported on 12/20/2019 11/04/19   June Leap L, DO  furosemide (LASIX) 80 MG tablet Take 1 tablet (80 mg total) by mouth daily. 12/02/19   Vevelyn Francois, NP  Glucosamine Sulfate 1000 MG CAPS Take 1 capsule (1,000 mg total) by mouth 2 (two) times daily. 08/22/19   Hilts, Michael, MD  glucose blood (TRUE METRIX BLOOD GLUCOSE TEST) test strip Use  as instructed 01/21/18   Dorena Dew, FNP  hydrocortisone 2.5 % cream Apply topically 2 (two) times daily. 10/14/19   Dorena Dew, FNP  Insulin Lispro Prot & Lispro (HUMALOG MIX 75/25 KWIKPEN) (75-25) 100 UNIT/ML Kwikpen INJECT 100 UNITS EVERY 12 HOURS Patient taking differently: Inject 100 Units into the skin every 12 (twelve) hours.  12/02/19   Vevelyn Francois, NP  Insulin Pen Needle (PEN NEEDLES) 30G X 5 MM MISC 1 Units by Does not apply route as directed. 12/02/19   Vevelyn Francois, NP  Lido-Capsaicin-Men-Methyl Sal 0.5-0.035-5-20 % PTCH Apply 1 patch topically daily. 12/18/19 03/17/20  Vevelyn Francois, NP  Lidocaine-Capsaicin 4-0.1 % CREA Apply 1 application topically 4 (four) times daily as needed. 11/24/19 02/22/20  Vevelyn Francois, NP  losartan (COZAAR) 100 MG tablet Take 1 tablet (100 mg total) by mouth daily. 12/02/19   Vevelyn Francois, NP  metoprolol succinate (TOPROL-XL) 100 MG 24 hr tablet Take 1 tablet (100 mg total) by mouth daily. Take with or immediately following a meal. 12/26/19   Vevelyn Francois, NP  norethindrone (AYGESTIN) 5 MG tablet Take 2 tablets (10 mg total) by mouth every 8 (eight) hours. 12/31/19 01/30/20  Aletha Halim, MD  omeprazole (PRILOSEC) 40 MG capsule Take 1 capsule (40 mg total) by mouth daily. 12/02/19   Vevelyn Francois, NP  potassium chloride SA (KLOR-CON) 20 MEQ tablet Take 1 tablet (20 mEq total) by mouth daily. 12/02/19   Vevelyn Francois, NP   saxagliptin HCl (ONGLYZA) 2.5 MG TABS tablet Take 1 tablet (2.5 mg total) by mouth daily. 12/02/19   Vevelyn Francois, NP  spironolactone (ALDACTONE) 25 MG tablet Take 1 tablet (25 mg total) by mouth daily for 7 days. 12/26/19 01/02/20  Vevelyn Francois, NP  tizanidine (ZANAFLEX) 6 MG capsule Take 1 capsule (6 mg total) by mouth 3 (three) times daily. 11/24/19 02/22/20  Vevelyn Francois, NP  Turmeric 500 MG CAPS Take 500 mg by mouth 2 (two) times daily. 08/22/19   Hilts, Legrand Como, MD  Vitamin D, Ergocalciferol, (DRISDOL) 1.25 MG (50000 UNIT) CAPS capsule Take 1 capsule (50,000 Units total) by mouth every 7 (seven) days. Patient taking differently: Take 50,000 Units by mouth every 7 (seven) days. Fridays 12/02/19   Vevelyn Francois, NP    Allergies    Ketoprofen, Aspirin, Gabapentin, Ibuprofen, Liraglutide, Naproxen, Omeprazole-sodium bicarbonate, Sulfa antibiotics, and Tramadol  Review of Systems   Review of Systems  Unable to perform ROS: Mental status change    Physical Exam Updated Vital Signs BP (!) 104/59   Pulse 75   Temp 98.3 F (36.8 C) (Oral)   Resp 19   SpO2 98%   Physical Exam Vitals and nursing note reviewed.  Constitutional:      Appearance: She is well-developed. She is obese.  HENT:     Head: Normocephalic and atraumatic.  Eyes:     General:        Right eye: No discharge.        Left eye: No discharge.     Conjunctiva/sclera: Conjunctivae normal.     Pupils: Pupils are equal, round, and reactive to light.     Comments: Pupils pinpoint bilaterally, minimally responsive to light  Neck:     Vascular: No JVD.     Trachea: No tracheal deviation.  Cardiovascular:     Rate and Rhythm: Normal rate and regular rhythm.  Pulmonary:     Effort: Pulmonary effort is normal.  Comments: Transmitted upper airway sounds Abdominal:     General: There is no distension.  Musculoskeletal:     Cervical back: Normal range of motion and neck supple. No rigidity or tenderness.   Lymphadenopathy:     Cervical: No cervical adenopathy.  Skin:    General: Skin is warm and dry.     Findings: No erythema.  Neurological:     Mental Status: She is oriented to person, place, and time. She is lethargic.     GCS: GCS eye subscore is 2. GCS verbal subscore is 4. GCS motor subscore is 5.     Comments: Drowsy, arouses to pain.  Will arouse very briefly but then fall back asleep.  Will not follow most commands.  Will not answer most questions.  Moves all extremities spontaneously without difficulty after administration of Narcan IV.  Sensation intact to light touch of extremities bilaterally.  No facial droop noted.  When she is more alert she is able to rollover.  Psychiatric:        Behavior: Behavior normal.     ED Results / Procedures / Treatments   Labs (all labs ordered are listed, but only abnormal results are displayed) Labs Reviewed  COMPREHENSIVE METABOLIC PANEL - Abnormal; Notable for the following components:      Result Value   Potassium 2.7 (*)    Glucose, Bld 186 (*)    Creatinine, Ser 1.91 (*)    Albumin 3.2 (*)    GFR calc non Af Amer 32 (*)    GFR calc Af Amer 37 (*)    Anion gap 17 (*)    All other components within normal limits  ETHANOL - Abnormal; Notable for the following components:   Alcohol, Ethyl (B) 146 (*)    All other components within normal limits  URINALYSIS, ROUTINE W REFLEX MICROSCOPIC - Abnormal; Notable for the following components:   APPearance CLOUDY (*)    Specific Gravity, Urine 1.003 (*)    Hgb urine dipstick LARGE (*)    Leukocytes,Ua LARGE (*)    Bacteria, UA RARE (*)    All other components within normal limits  CBC WITH DIFFERENTIAL/PLATELET - Abnormal; Notable for the following components:   WBC 11.5 (*)    Hemoglobin 10.7 (*)    HCT 35.0 (*)    MCV 79.4 (*)    MCH 24.3 (*)    RDW 22.9 (*)    All other components within normal limits  LACTIC ACID, PLASMA - Abnormal; Notable for the following components:    Lactic Acid, Venous 5.2 (*)    All other components within normal limits  LACTIC ACID, PLASMA - Abnormal; Notable for the following components:   Lactic Acid, Venous 4.4 (*)    All other components within normal limits  ACETAMINOPHEN LEVEL - Abnormal; Notable for the following components:   Acetaminophen (Tylenol), Serum <10 (*)    All other components within normal limits  SALICYLATE LEVEL - Abnormal; Notable for the following components:   Salicylate Lvl <2.0 (*)    All other components within normal limits  MAGNESIUM - Abnormal; Notable for the following components:   Magnesium 1.6 (*)    All other components within normal limits  CBG MONITORING, ED - Abnormal; Notable for the following components:   Glucose-Capillary 170 (*)    All other components within normal limits  POCT I-STAT EG7 - Abnormal; Notable for the following components:   pCO2, Ven 33.8 (*)    pO2, Ven 94.0 (*)  Potassium 2.6 (*)    Calcium, Ion 1.02 (*)    All other components within normal limits  POCT I-STAT EG7 - Abnormal; Notable for the following components:   pCO2, Ven 36.9 (*)    Potassium 3.0 (*)    Calcium, Ion 1.08 (*)    All other components within normal limits  RESPIRATORY PANEL BY RT PCR (FLU A&B, COVID)  RAPID URINE DRUG SCREEN, HOSP PERFORMED  AMMONIA  I-STAT BETA HCG BLOOD, ED ([facility], [facility], [facility] ONLY)    EKG None  Radiology CT Head Wo Contrast  Result Date: 01/29/2020 CLINICAL DATA:  . Found unresponsive. Recent fall. EXAM: CT HEAD WITHOUT CONTRAST CT CERVICAL SPINE WITHOUT CONTRAST TECHNIQUE: Multidetector CT imaging of the head and cervical spine was performed following the standard protocol without intravenous contrast. Multiplanar CT image reconstructions of the cervical spine were also generated. COMPARISON:  None. FINDINGS: CT HEAD FINDINGS Brain: There is no mass, hemorrhage or extra-axial collection. The size and configuration of the ventricles and extra-axial CSF spaces are normal. The  brain parenchyma is normal, without evidence of acute or chronic infarction. Vascular: No abnormal hyperdensity of the major intracranial arteries or dural venous sinuses. No intracranial atherosclerosis. Skull: The visualized skull base, calvarium and extracranial soft tissues are normal. Sinuses/Orbits: No fluid levels or advanced mucosal thickening of the visualized paranasal sinuses. No mastoid or middle ear effusion. The orbits are normal. CT CERVICAL SPINE FINDINGS Alignment: No static subluxation. Facets are aligned. Occipital condyles are normally positioned. Skull base and vertebrae: No acute fracture. Soft tissues and spinal canal: No prevertebral fluid or swelling. No visible canal hematoma. Disc levels: No advanced spinal canal or neural foraminal stenosis. Upper chest: No pneumothorax, pulmonary nodule or pleural effusion. Other: Normal visualized paraspinal cervical soft tissues. IMPRESSION: 1. No acute intracranial abnormality. 2. No acute fracture or static subluxation of the cervical spine. Electronically Signed   By: Ulyses Jarred M.D.   On: 01/29/2020 03:58   CT Cervical Spine Wo Contrast  Result Date: 01/29/2020 CLINICAL DATA:  . Found unresponsive. Recent fall. EXAM: CT HEAD WITHOUT CONTRAST CT CERVICAL SPINE WITHOUT CONTRAST TECHNIQUE: Multidetector CT imaging of the head and cervical spine was performed following the standard protocol without intravenous contrast. Multiplanar CT image reconstructions of the cervical spine were also generated. COMPARISON:  None. FINDINGS: CT HEAD FINDINGS Brain: There is no mass, hemorrhage or extra-axial collection. The size and configuration of the ventricles and extra-axial CSF spaces are normal. The brain parenchyma is normal, without evidence of acute or chronic infarction. Vascular: No abnormal hyperdensity of the major intracranial arteries or dural venous sinuses. No intracranial atherosclerosis. Skull: The visualized skull base, calvarium and  extracranial soft tissues are normal. Sinuses/Orbits: No fluid levels or advanced mucosal thickening of the visualized paranasal sinuses. No mastoid or middle ear effusion. The orbits are normal. CT CERVICAL SPINE FINDINGS Alignment: No static subluxation. Facets are aligned. Occipital condyles are normally positioned. Skull base and vertebrae: No acute fracture. Soft tissues and spinal canal: No prevertebral fluid or swelling. No visible canal hematoma. Disc levels: No advanced spinal canal or neural foraminal stenosis. Upper chest: No pneumothorax, pulmonary nodule or pleural effusion. Other: Normal visualized paraspinal cervical soft tissues. IMPRESSION: 1. No acute intracranial abnormality. 2. No acute fracture or static subluxation of the cervical spine. Electronically Signed   By: Ulyses Jarred M.D.   On: 01/29/2020 03:58   DG Chest Portable 1 View  Result Date: 01/29/2020 CLINICAL DATA:  Altered mental status EXAM:  PORTABLE CHEST 1 VIEW COMPARISON:  02/07/2019 FINDINGS: Cardiomegaly accentuated by portable technique. Minimal streaky density at the right base, likely atelectasis. Negative aortic and hilar contours for technique. No effusion or pneumothorax. IMPRESSION: Cardiomegaly without failure. Probable mild atelectasis on the right. Electronically Signed   By: Monte Fantasia M.D.   On: 01/29/2020 04:46    Procedures .Critical Care Performed by: Renita Papa, PA-C Authorized by: Renita Papa, PA-C   Critical care provider statement:    Critical care time (minutes):  45   Critical care was necessary to treat or prevent imminent or life-threatening deterioration of the following conditions:  Metabolic crisis   Critical care was time spent personally by me on the following activities:  Discussions with consultants, evaluation of patient's response to treatment, examination of patient, ordering and performing treatments and interventions, ordering and review of laboratory studies, ordering  and review of radiographic studies, pulse oximetry, re-evaluation of patient's condition, obtaining history from patient or surrogate and review of old charts   (including critical care time)  Medications Ordered in ED Medications  naloxone (NARCAN) 2 MG/2ML injection (has no administration in time range)  ondansetron (ZOFRAN) 4 MG/2ML injection (has no administration in time range)  metroNIDAZOLE (FLAGYL) IVPB 500 mg (has no administration in time range)  potassium chloride 10 mEq in 100 mL IVPB ( Intravenous Rate/Dose Change 01/29/20 0655)  vancomycin (VANCOREADY) IVPB 2000 mg/400 mL (2,000 mg Intravenous New Bag/Given 01/29/20 0655)  sodium chloride 0.9 % bolus 500 mL (0 mLs Intravenous Stopped 01/29/20 0511)  glucagon (human recombinant) (GLUCAGEN) injection 2 mg (2 mg Intravenous Given 01/29/20 0409)  naloxone (NARCAN) injection 2 mg (2 mg Intravenous Given 01/29/20 0323)  ondansetron (ZOFRAN) injection 4 mg (4 mg Intravenous Given 01/29/20 0321)  naloxone (NARCAN) 2 MG/2ML injection (2 mg  Given 01/29/20 0409)  ceFEPIme (MAXIPIME) 2 g in sodium chloride 0.9 % 100 mL IVPB (0 g Intravenous Stopped 01/29/20 7342)    ED Course  I have reviewed the triage vital signs and the nursing notes.  Pertinent labs & imaging results that were available during my care of the patient were reviewed by me and considered in my medical decision making (see chart for details).    MDM Rules/Calculators/A&P                      Patient presenting brought in by EMS for evaluation of altered mental status.  She is afebrile, hypotensive on arrival, tachypneic but SPO2 saturations are within normal limits.  She is quite drowsy but arouses to sternal rub on my initial assessment.  She will not stay awake for very long and is noncompliant with examination.  The decision was made to give IV Narcan.  She did respond to this for a brief period, appeared to be more alert temporarily.  She denies use of narcotic pain  medicines but tells me she has been taking her muscle relaxer tizanidine.  Her UDS is negative.  She has been seen previously for overdose involving ethanol, muscle relaxant, and pain medicine.  Her ethanol level today is elevated.  When she is more alert she exhibits no focal neurologic deficits, moves all extremities spontaneously.  She is oriented to person place and time.  She is able to give me details of her recent hospitalization with OB/GYN in March for vaginal bleeding.  She exhibits no meningeal signs, neck is supple.  Doubt meningitis at this time.  Chest x-ray obtained which shows cardiomegaly  without evidence of failure, no definitive evidence of pneumonia.  She reportedly had a history of 2 falls last week but she has no signs of serious head trauma or basilar skull fracture on examination.  Head and cervical spine CTs shows no acute intracranial abnormalities, no cervical spine injury noted.  Lab work reviewed and interpreted by myself is significant for leukocytosis of 11.5, stable anemia with hemoglobin of 10.7, hypokalemia with potassium of 2.7, hypomagnesemia with magnesium 1.6.  She was given both IV magnesium and potassium in the ED.  She has a mild elevation in her BUN but creatinine is within normal limits.  Ammonia level is within normal limits.  Lactic acid level is elevated at 5.2.  Anion gap is elevated, likely in the setting of lactic acidosis versus alcoholic acidosis. The patient was given IV fluids upon initial arrival due to her hypotension with some improvement in her blood pressure.  She was not given a full 30 cc/kg fluid bolus as we were waiting for the results of the Covid test.  She did meet sepsis criteria though source is unclear as her UA is not a clean-catch and her chest x-ray is negative.  She was started on broad-spectrum antibiotics, though this can be deescalated if no infectious source is identified.  Again doubt meningitis in the absence of fever, meningeal signs  and given she is actually oriented to person place and time when she is able to be aroused.  She is maintaining her airway, does not require emergent intubation.  Will require admission for altered mental status in the setting of elevated ethanol, polysubstance abuse, elevated lactic acid level.  Spoke with Dr. Hal Hope with [facility] hospitalist service who agrees to assume care of patient and bring her into the hospital for further evaluation and management.  Patient was seen and evaluated by Dr. Randal Buba who agrees with assessment and plan at this time.  Final Clinical Impression(s) / ED Diagnoses Final diagnoses:  Altered mental status, unspecified altered mental status type  Toxic effect of ethanol, undetermined intent, initial encounter ([place])  Lactic acidosis    Rx / DC Orders ED Discharge Orders    None       Debroah Baller 01/29/20 0708    Palumbo, April, MD 02/04/20 2324

## 2020-01-29 NOTE — Progress Notes (Signed)
[facility] informed that it was reported and confirmed by patient that she need general anesthesia for MRI procedure. Ilean Skill LPN

## 2020-01-29 NOTE — ED Notes (Signed)
Pt has questions about her MRI.  MRI called and need to obtain a KUB prior to MRI.  MD contacted.

## 2020-01-29 NOTE — Progress Notes (Signed)
Patient refusing her MRI of the brain. Very claustrophobic and her previous MRI's have been done under anesthesia. May need to coordinate with anesthesia to get this done or some sort of strong sedative.

## 2020-01-29 NOTE — ED Notes (Signed)
Pt waking up, following commands and answer questions appropriately at present.

## 2020-01-29 NOTE — Progress Notes (Signed)
Pt received from ED. VSS. Telemetry applied. Pt oriented to room and unit. Call light in reach.  Versie Starks, RN

## 2020-01-29 NOTE — ED Triage Notes (Addendum)
Pt from home after mother called out for a possible diabetic emergency. Pt unresponsive on EMS arrival. CBG 203. Mother reported pt fell 2 days and hit her head. EMS reported bp 161/113, pulse 76, 18 g IV to L AC. Pupils pinpoint on arrival Dr. Nicanor Alcon made aware

## 2020-01-29 NOTE — ED Notes (Signed)
Date and time results received: 01/29/20   Test: Lactic Acid Critical Value: 3.3  Name of Provider Notified: Ophelia Charter MD

## 2020-01-29 NOTE — ED Notes (Signed)
Jill Shaw, mom, [phone] would like an update when available

## 2020-01-29 NOTE — ED Notes (Signed)
Tap water enema offered to pt- per MD order, pt refused. Will continue to monitor.

## 2020-01-29 NOTE — Progress Notes (Signed)
Medication not available

## 2020-01-29 NOTE — ED Notes (Signed)
Lunch Tray Ordered@ 1025. 

## 2020-01-29 NOTE — ED Notes (Signed)
Care handoff given to Denyse Amass RN

## 2020-01-29 NOTE — ED Notes (Signed)
PT at bedside.

## 2020-01-29 NOTE — H&P (Signed)
History and Physical    Jill Shaw RDE:[id number] DOB: [birth year] DOA: 01/29/2020  PCP: Vevelyn Francois, NP Consultants:  Kennon Rounds - OB/GYN; Hilts - orthopedics; Icard - pulmonology; Evans - podiatry Patient coming from:  Home - lives with mother and 43yo son; NOK: Mother, [phone]  Chief Complaint: AMS  HPI: Jill Shaw is a 44 y.o. female with medical history significant of morbid obesity; tobacco dependence; seizures; HTN; DM; and COPD presenting with AMS.  Her mother said that she passed out, was unconscious last night.  She reports that she has had a couple of falls in the last 2 weeks, hitting her head.  She was "supposed to get an MRI last night."  Normal day yesterday but she is having bad headaches and if she leans to the side she feels pressure.  If she is laying down she is ok, but notices problems when she gets up to walk.  She remembers going to the store, came home, ate dinner; she folded some clothes; and watched TV until about 8-9pm.  She folded some more clothes.  She doesn't remember anything after that.  When she woke up she was in CT.  She feels ok now but still frontal headache.  +n/v.  +photophobia.    ED Course:  Carryover, per Dr. Hal Hope:  44 year old female with a history of diabetes mellitus polysubstance abuse chronic kidney disease was found to be encephalopathic at home and patient's mom called the EMS. Patient was not hypoglycemic or febrile. Patient was altered. CT head did not show anything acute. Alcohol levels were positive. At this time the working diagnosis is possibly drug related. As per the ER physician no definite signs of any meningitis encephalitis though empirically start on antibiotic MRI brain is pending. No signs of any active seizures. Patient is protecting airway and responds to questions as per the ER physician.  Review of Systems: As per HPI; otherwise review of systems reviewed and negative.   Ambulatory Status:  Ambulates  with a cane > walker  COVID Vaccine Status:  None  Past Medical History:  Diagnosis Date  . Anemia   . Arthritis    knees, hands  . Asthma   . COPD (chronic obstructive pulmonary disease) ([place])   . Diabetes mellitus without complication ([place])    type 2  . Dysfunctional uterine bleeding   . GERD (gastroesophageal reflux disease)   . Hypertension   . Neuromuscular disorder (HCC)    neuropathy feet  . Seizures ([place]) 09/12/2017   pt states r/t stress and blood sugar - no meds last one 4 months ago, not seen neurologist  . Sickle cell trait ([place])   . Smoker   . Vitamin D deficiency 10/2019  . Wears glasses     Past Surgical History:  Procedure Laterality Date  . CESAREAN SECTION     x 1  . DILATION AND CURETTAGE OF UTERUS N/A 08/20/2019   Procedure: DILATATION AND CURETTAGE;  Surgeon: Emily Filbert, MD;  Location: [facility];  Service: Gynecology;  Laterality: N/A;  . ENDOMETRIAL ABLATION N/A 08/20/2019   Procedure: [place] Ablation;  Surgeon: Emily Filbert, MD;  Location: [facility];  Service: Gynecology;  Laterality: N/A;  . EYE SURGERY Bilateral    laser right and cataract removed left eye  . RADIOLOGY WITH ANESTHESIA N/A 09/16/2019   Procedure: MRI WITH ANESTHESIA   L SPINE WITHOUT CONTRAST, T SPINE WITHOUT CONTRAST , CERVICAL WITHOUT CONTRAST;  Surgeon: Radiologist, Medication, MD;  Location: [facility];  Service: Radiology;  Laterality: N/A;  . TUBAL LIGATION    . UPPER GI ENDOSCOPY  07/2017    Social History   Socioeconomic History  . Marital status: Legally Separated    Spouse name: Not on file  . Number of children: Not on file  . Years of education: Not on file  . Highest education level: Not on file  Occupational History  . Occupation: unemployed  Tobacco Use  . Smoking status: Current Every Day Smoker    Packs/day: 0.50    Years: 26.00    Pack years: 13.00    Types: Cigarettes  . Smokeless tobacco: Never Used  Substance and Sexual Activity  . Alcohol use: No  . Drug  use: No  . Sexual activity: Not Currently    Birth control/protection: None  Other Topics Concern  . Not on file  Social History Narrative  . Not on file   Social Determinants of Health   Financial Resource Strain:   . Difficulty of Paying Living Expenses:   Food Insecurity:   . Worried About Charity fundraiser in the Last Year:   . Arboriculturist in the Last Year:   Transportation Needs:   . Film/video editor (Medical):   Marland Kitchen Lack of Transportation (Non-Medical):   Physical Activity:   . Days of Exercise per Week:   . Minutes of Exercise per Session:   Stress:   . Feeling of Stress :   Social Connections:   . Frequency of Communication with Friends and Family:   . Frequency of Social Gatherings with Friends and Family:   . Attends Religious Services:   . Active Member of Clubs or Organizations:   . Attends Archivist Meetings:   Marland Kitchen Marital Status:   Intimate Partner Violence:   . Fear of Current or Ex-Partner:   . Emotionally Abused:   Marland Kitchen Physically Abused:   . Sexually Abused:     Allergies  Allergen Reactions  . Ketoprofen Nausea And Vomiting  . Aspirin Nausea Only  . Gabapentin Nausea And Vomiting    upset stomach  . Ibuprofen Nausea And Vomiting  . Liraglutide Nausea And Vomiting  . Naproxen Nausea And Vomiting  . Omeprazole-Sodium Bicarbonate Nausea And Vomiting  . Sulfa Antibiotics Nausea And Vomiting  . Tramadol Nausea And Vomiting    stomach upset    Family History  Problem Relation Age of Onset  . Diabetes Mother   . Hypertension Mother     Prior to Admission medications   Medication Sig Start Date End Date Taking? Authorizing Provider  albuterol (PROVENTIL) (2.5 MG/3ML) 0.083% nebulizer solution Take 3 mLs (2.5 mg total) by nebulization every 6 (six) hours as needed for wheezing or shortness of breath. 12/02/19   Vevelyn Francois, NP  albuterol (VENTOLIN HFA) 108 (90 Base) MCG/ACT inhaler Inhale 2 puffs into the lungs every 6 (six)  hours as needed for wheezing or shortness of breath. 12/02/19   Vevelyn Francois, NP  Blood Glucose Monitoring Suppl (TRUE METRIX METER) w/Device KIT 1 each by Does not apply route 4 (four) times daily -  before meals and at bedtime. 01/21/18   Dorena Dew, FNP  budesonide-formoterol (SYMBICORT) 160-4.5 MCG/ACT inhaler INHALE 2 PUFFS INTO THE LUNGS 2 (TWO) TIMES DAILY. Patient taking differently: Inhale 2 puffs into the lungs 2 (two) times daily.  12/02/19   Vevelyn Francois, NP  cetirizine (ZYRTEC) 10 MG tablet Take 1 tablet (10 mg total)  by mouth daily. 12/02/19   Vevelyn Francois, NP  Cholecalciferol (VITAMIN D-3) 125 MCG (5000 UT) TABS Take 1 tablet by mouth daily. Patient taking differently: Take 5,000 Units by mouth daily.  08/22/19   Hilts, Legrand Como, MD  ferrous sulfate 325 (65 FE) MG tablet Take 1 tablet (325 mg total) by mouth 3 (three) times daily with meals. 12/02/19   Vevelyn Francois, NP  fluticasone (FLONASE) 50 MCG/ACT nasal spray Place 2 sprays into both nostrils daily. 11/24/19   Vevelyn Francois, NP  fluticasone furoate-vilanterol (BREO ELLIPTA) 200-25 MCG/INH AEPB Inhale 1 puff into the lungs daily. Patient not taking: Reported on 12/20/2019 11/04/19   June Leap L, DO  furosemide (LASIX) 80 MG tablet Take 1 tablet (80 mg total) by mouth daily. 12/02/19   Vevelyn Francois, NP  Glucosamine Sulfate 1000 MG CAPS Take 1 capsule (1,000 mg total) by mouth 2 (two) times daily. 08/22/19   Hilts, Legrand Como, MD  glucose blood (TRUE METRIX BLOOD GLUCOSE TEST) test strip Use as instructed 01/21/18   Dorena Dew, FNP  hydrocortisone 2.5 % cream Apply topically 2 (two) times daily. 10/14/19   Dorena Dew, FNP  Insulin Lispro Prot & Lispro (HUMALOG MIX 75/25 KWIKPEN) (75-25) 100 UNIT/ML Kwikpen INJECT 100 UNITS EVERY 12 HOURS Patient taking differently: Inject 100 Units into the skin every 12 (twelve) hours.  12/02/19   Vevelyn Francois, NP  Insulin Pen Needle (PEN NEEDLES) 30G X 5 MM MISC 1 Units by  Does not apply route as directed. 12/02/19   Vevelyn Francois, NP  Lido-Capsaicin-Men-Methyl Sal 0.5-0.035-5-20 % PTCH Apply 1 patch topically daily. 12/18/19 03/17/20  Vevelyn Francois, NP  Lidocaine-Capsaicin 4-0.1 % CREA Apply 1 application topically 4 (four) times daily as needed. 11/24/19 02/22/20  Vevelyn Francois, NP  losartan (COZAAR) 100 MG tablet Take 1 tablet (100 mg total) by mouth daily. 12/02/19   Vevelyn Francois, NP  metoprolol succinate (TOPROL-XL) 100 MG 24 hr tablet Take 1 tablet (100 mg total) by mouth daily. Take with or immediately following a meal. 12/26/19   Vevelyn Francois, NP  norethindrone (AYGESTIN) 5 MG tablet Take 2 tablets (10 mg total) by mouth every 8 (eight) hours. 12/31/19 01/30/20  Aletha Halim, MD  omeprazole (PRILOSEC) 40 MG capsule Take 1 capsule (40 mg total) by mouth daily. 12/02/19   Vevelyn Francois, NP  potassium chloride SA (KLOR-CON) 20 MEQ tablet Take 1 tablet (20 mEq total) by mouth daily. 12/02/19   Vevelyn Francois, NP  saxagliptin HCl (ONGLYZA) 2.5 MG TABS tablet Take 1 tablet (2.5 mg total) by mouth daily. 12/02/19   Vevelyn Francois, NP  spironolactone (ALDACTONE) 25 MG tablet Take 1 tablet (25 mg total) by mouth daily for 7 days. 12/26/19 01/02/20  Vevelyn Francois, NP  tizanidine (ZANAFLEX) 6 MG capsule Take 1 capsule (6 mg total) by mouth 3 (three) times daily. 11/24/19 02/22/20  Vevelyn Francois, NP  Turmeric 500 MG CAPS Take 500 mg by mouth 2 (two) times daily. 08/22/19   Hilts, Legrand Como, MD  Vitamin D, Ergocalciferol, (DRISDOL) 1.25 MG (50000 UNIT) CAPS capsule Take 1 capsule (50,000 Units total) by mouth every 7 (seven) days. Patient taking differently: Take 50,000 Units by mouth every 7 (seven) days. Fridays 12/02/19   Vevelyn Francois, NP    Physical Exam: Vitals:   01/29/20 1300 01/29/20 1445 01/29/20 1500 01/29/20 1611  BP: (!) 103/54 (!) 98/59 115/64 140/68  Pulse:  77 78  Resp: '20 20 19 19  '$ Temp:    98.5 F (36.9 C)  TempSrc:    Oral  SpO2:   99% 100%   Weight:      Height:         . General:  Appears calm and comfortable and is NAD, mildly somnolent; morbidly obese . Eyes:  PERRL, EOMI, normal lids, iris . ENT:  grossly normal hearing, lips & tongue, mmm . Neck:  no LAD, masses or thyromegaly . Cardiovascular:  RRR, no m/r/g. No LE edema.  Marland Kitchen Respiratory:   CTA bilaterally with no wheezes/rales/rhonchi.  Normal respiratory effort. . Abdomen:  soft, NT, ND, NABS . Skin:  no rash or induration seen on limited exam . Musculoskeletal:  grossly normal tone BUE/BLE, good ROM, no bony abnormality . Psychiatric:  blunted mood and affect, speech fluent and appropriate, AOx3 . Neurologic:  CN 2-12 grossly intact, moves all extremities in coordinated fashion    Radiological Exams on Admission: DG Abdomen 1 View  Result Date: 01/29/2020 CLINICAL DATA:  Sickle cell.  Altered mental status EXAM: ABDOMEN - 1 VIEW COMPARISON:  None. FINDINGS: The nonspecific bowel gas pattern without evidence of obstruction. Air is seen within large and small bowel loops throughout the abdomen. No radio-opaque calculi or other significant radiographic abnormality are seen. No acute osseous findings. IMPRESSION: Nonspecific bowel gas pattern. Electronically Signed   By: Davina Poke D.O.   On: 01/29/2020 13:34   CT Head Wo Contrast  Result Date: 01/29/2020 CLINICAL DATA:  . Found unresponsive. Recent fall. EXAM: CT HEAD WITHOUT CONTRAST CT CERVICAL SPINE WITHOUT CONTRAST TECHNIQUE: Multidetector CT imaging of the head and cervical spine was performed following the standard protocol without intravenous contrast. Multiplanar CT image reconstructions of the cervical spine were also generated. COMPARISON:  None. FINDINGS: CT HEAD FINDINGS Brain: There is no mass, hemorrhage or extra-axial collection. The size and configuration of the ventricles and extra-axial CSF spaces are normal. The brain parenchyma is normal, without evidence of acute or chronic infarction.  Vascular: No abnormal hyperdensity of the major intracranial arteries or dural venous sinuses. No intracranial atherosclerosis. Skull: The visualized skull base, calvarium and extracranial soft tissues are normal. Sinuses/Orbits: No fluid levels or advanced mucosal thickening of the visualized paranasal sinuses. No mastoid or middle ear effusion. The orbits are normal. CT CERVICAL SPINE FINDINGS Alignment: No static subluxation. Facets are aligned. Occipital condyles are normally positioned. Skull base and vertebrae: No acute fracture. Soft tissues and spinal canal: No prevertebral fluid or swelling. No visible canal hematoma. Disc levels: No advanced spinal canal or neural foraminal stenosis. Upper chest: No pneumothorax, pulmonary nodule or pleural effusion. Other: Normal visualized paraspinal cervical soft tissues. IMPRESSION: 1. No acute intracranial abnormality. 2. No acute fracture or static subluxation of the cervical spine. Electronically Signed   By: Ulyses Jarred M.D.   On: 01/29/2020 03:58   CT Cervical Spine Wo Contrast  Result Date: 01/29/2020 CLINICAL DATA:  . Found unresponsive. Recent fall. EXAM: CT HEAD WITHOUT CONTRAST CT CERVICAL SPINE WITHOUT CONTRAST TECHNIQUE: Multidetector CT imaging of the head and cervical spine was performed following the standard protocol without intravenous contrast. Multiplanar CT image reconstructions of the cervical spine were also generated. COMPARISON:  None. FINDINGS: CT HEAD FINDINGS Brain: There is no mass, hemorrhage or extra-axial collection. The size and configuration of the ventricles and extra-axial CSF spaces are normal. The brain parenchyma is normal, without evidence of acute or chronic infarction. Vascular: No abnormal hyperdensity  of the major intracranial arteries or dural venous sinuses. No intracranial atherosclerosis. Skull: The visualized skull base, calvarium and extracranial soft tissues are normal. Sinuses/Orbits: No fluid levels or advanced  mucosal thickening of the visualized paranasal sinuses. No mastoid or middle ear effusion. The orbits are normal. CT CERVICAL SPINE FINDINGS Alignment: No static subluxation. Facets are aligned. Occipital condyles are normally positioned. Skull base and vertebrae: No acute fracture. Soft tissues and spinal canal: No prevertebral fluid or swelling. No visible canal hematoma. Disc levels: No advanced spinal canal or neural foraminal stenosis. Upper chest: No pneumothorax, pulmonary nodule or pleural effusion. Other: Normal visualized paraspinal cervical soft tissues. IMPRESSION: 1. No acute intracranial abnormality. 2. No acute fracture or static subluxation of the cervical spine. Electronically Signed   By: Ulyses Jarred M.D.   On: 01/29/2020 03:58   DG Chest Portable 1 View  Result Date: 01/29/2020 CLINICAL DATA:  Altered mental status EXAM: PORTABLE CHEST 1 VIEW COMPARISON:  02/07/2019 FINDINGS: Cardiomegaly accentuated by portable technique. Minimal streaky density at the right base, likely atelectasis. Negative aortic and hilar contours for technique. No effusion or pneumothorax. IMPRESSION: Cardiomegaly without failure. Probable mild atelectasis on the right. Electronically Signed   By: Monte Fantasia M.D.   On: 01/29/2020 04:46    EKG:  pending   Labs on Admission: I have personally reviewed the available labs and imaging studies at the time of the admission.  Pertinent labs:   ABG: 7.4256/33.8/94.0/22.3/98% -> 7.399/36.9/39.0/22.8 K+ 2.7 Glucose 185 BUN 15/Creatinine 1.91/GFR 37; baseline creatinine is about 1 Anion gap 17 UA: large Hgb, large LE, rare bacteria UDS negative Lactate 5.2, 4.4, 3.3, 2.4 Mag++ 1.6 ETOH <10 ASA <7 Respiratory panel PCR negative   Assessment/Plan Principal Problem:   Acute encephalopathy Active Problems:   Type 2 diabetes mellitus without complication, with long-term current use of insulin (HCC)   Essential hypertension   Morbid obesity (HCC)    Tobacco dependence   Seizure (HCC)   Chronic obstructive pulmonary disease (HCC)   Chronic pain   AKI (acute kidney injury) ([place])   Acute metabolic encephalopathy -Patient presenting with encephalopathy as evidenced by her obtunded/unresponsiveness -This has improved and the patient was awake and alert and able to provide a history at the time of my evaluation -While initial concern was for infection given her elevated lactate and so she was given broad-spectrum antibiotics empirically, there is no other evidence of infection at this time -Her acute metabolic encephalopathy is suspected to be due to AKI with dehydration and possible medication misadventure -Based on unremarkable evaluation with current ability to protect her airway, will observe for now with IVF hydration and telemetry monitoring -Assuming ongoing clinical improvement, she may be appropriate for d/c on 4/16 -MRI was ordered by overnight team  AKI -Likely due to prerenal failure secondary to dehydration and continuation of Lasix, Cozaar -Hold diuretics for now -IVF  -Follow up renal function by BMP -Avoid ACEI and NSAIDs  Chronic pain -Hold Norco, Zanaflex due to excessive sedation at the time of admission; excessive use of opiates (despite negative UDS) could have contributed to initial presentation -Interestingly, while she lists Norco as a current medication, PDMP review indicates that she is not receiving controled substances as an outpatient  COPD -Continue Symbicort, Zyrtec, Flonase, and prn Albuterol  HTN -Hold home BP meds (Cozaar, Toprol XL) due to borderline low BP on presentation  Seizure d/o -She does not appear to be taking anti-seizure medication at this time  DM -Will check  A1c -Continue 75/25 -Hold Onglyza -Cover with resistant-scale SSI  Tobacco dependence -Tobacco Dependence: encourage cessation.   -This was discussed with the patient and should be reviewed on an ongoing basis.   -Patch  declined  Morbid obesity Body mass index is 46.35 kg/m.  -Weight loss should be encouraged -Outpatient PCP/bariatric medicine/bariatric surgery f/u encouraged   Note: This patient has been tested and is negative for the novel coronavirus COVID-19.  DVT prophylaxis:  Lovenox Code Status:  Full - confirmed with patient Family Communication: None present; she did not ask that I speak with her mother at the time of the admission Disposition Plan:  The patient is from: home  Anticipated d/c is to: home without [facility] services   Anticipated d/c date will depend on clinical response to treatment, but possibly as early as tomorrow if she has excellent response to treatment  Patient is currently: mildly acutely ill Consults called: None  Admission status:  It is my clinical opinion that referral for OBSERVATION is reasonable and necessary in this patient based on the above information provided. The aforementioned taken together are felt to place the patient at high risk for further clinical deterioration. However it is anticipated that the patient may be medically stable for discharge from the hospital within 24 to 48 hours.    Karmen Bongo MD [facility]   How to contact the [facility] Attending or Consulting provider [place] or covering provider during after hours [place], for this patient?  1. Check the care team in [facility] and look for a) attending/consulting [facility] provider listed and b) the [facility] team listed 2. Log into [email or web address] and use [place]'s universal password to access. If you do not have the password, please contact the hospital operator. 3. Locate the [facility] provider you are looking for under [facility] and page to a number that you can be directly reached. 4. If you still have difficulty reaching the provider, please page the [facility] (Director on Call) for the Hospitalists listed on amion for assistance.   01/29/2020, 5:55 PM

## 2020-01-30 ENCOUNTER — Observation Stay (HOSPITAL_COMMUNITY): Payer: Medicaid Other

## 2020-01-30 ENCOUNTER — Inpatient Hospital Stay (HOSPITAL_COMMUNITY): Payer: Medicaid Other

## 2020-01-30 DIAGNOSIS — E860 Dehydration: Secondary | ICD-10-CM | POA: Diagnosis present

## 2020-01-30 DIAGNOSIS — G9341 Metabolic encephalopathy: Secondary | ICD-10-CM | POA: Diagnosis present

## 2020-01-30 DIAGNOSIS — D573 Sickle-cell trait: Secondary | ICD-10-CM | POA: Diagnosis present

## 2020-01-30 DIAGNOSIS — K219 Gastro-esophageal reflux disease without esophagitis: Secondary | ICD-10-CM | POA: Diagnosis present

## 2020-01-30 DIAGNOSIS — G8929 Other chronic pain: Secondary | ICD-10-CM | POA: Diagnosis present

## 2020-01-30 DIAGNOSIS — M1990 Unspecified osteoarthritis, unspecified site: Secondary | ICD-10-CM | POA: Diagnosis present

## 2020-01-30 DIAGNOSIS — G40909 Epilepsy, unspecified, not intractable, without status epilepticus: Secondary | ICD-10-CM | POA: Diagnosis present

## 2020-01-30 DIAGNOSIS — R569 Unspecified convulsions: Secondary | ICD-10-CM

## 2020-01-30 DIAGNOSIS — R102 Pelvic and perineal pain: Secondary | ICD-10-CM | POA: Diagnosis not present

## 2020-01-30 DIAGNOSIS — N182 Chronic kidney disease, stage 2 (mild): Secondary | ICD-10-CM | POA: Diagnosis present

## 2020-01-30 DIAGNOSIS — G9340 Encephalopathy, unspecified: Secondary | ICD-10-CM

## 2020-01-30 DIAGNOSIS — E872 Acidosis: Secondary | ICD-10-CM | POA: Diagnosis present

## 2020-01-30 DIAGNOSIS — R296 Repeated falls: Secondary | ICD-10-CM | POA: Diagnosis present

## 2020-01-30 DIAGNOSIS — I129 Hypertensive chronic kidney disease with stage 1 through stage 4 chronic kidney disease, or unspecified chronic kidney disease: Secondary | ICD-10-CM | POA: Diagnosis present

## 2020-01-30 DIAGNOSIS — N926 Irregular menstruation, unspecified: Secondary | ICD-10-CM | POA: Diagnosis present

## 2020-01-30 DIAGNOSIS — E1122 Type 2 diabetes mellitus with diabetic chronic kidney disease: Secondary | ICD-10-CM | POA: Diagnosis present

## 2020-01-30 DIAGNOSIS — J449 Chronic obstructive pulmonary disease, unspecified: Secondary | ICD-10-CM | POA: Diagnosis present

## 2020-01-30 DIAGNOSIS — Z6841 Body Mass Index (BMI) 40.0 and over, adult: Secondary | ICD-10-CM | POA: Diagnosis not present

## 2020-01-30 DIAGNOSIS — N179 Acute kidney failure, unspecified: Secondary | ICD-10-CM | POA: Diagnosis present

## 2020-01-30 DIAGNOSIS — E876 Hypokalemia: Secondary | ICD-10-CM | POA: Diagnosis present

## 2020-01-30 DIAGNOSIS — Z79899 Other long term (current) drug therapy: Secondary | ICD-10-CM | POA: Diagnosis not present

## 2020-01-30 DIAGNOSIS — E559 Vitamin D deficiency, unspecified: Secondary | ICD-10-CM | POA: Diagnosis present

## 2020-01-30 DIAGNOSIS — F17210 Nicotine dependence, cigarettes, uncomplicated: Secondary | ICD-10-CM | POA: Diagnosis present

## 2020-01-30 DIAGNOSIS — Z7951 Long term (current) use of inhaled steroids: Secondary | ICD-10-CM | POA: Diagnosis not present

## 2020-01-30 DIAGNOSIS — Z20822 Contact with and (suspected) exposure to covid-19: Secondary | ICD-10-CM | POA: Diagnosis present

## 2020-01-30 DIAGNOSIS — E1136 Type 2 diabetes mellitus with diabetic cataract: Secondary | ICD-10-CM | POA: Diagnosis present

## 2020-01-30 LAB — MAGNESIUM: Magnesium: 1.6 mg/dL — ABNORMAL LOW (ref 1.7–2.4)

## 2020-01-30 LAB — CBC
HCT: 33.9 % — ABNORMAL LOW (ref 36.0–46.0)
Hemoglobin: 10.5 g/dL — ABNORMAL LOW (ref 12.0–15.0)
MCH: 24.4 pg — ABNORMAL LOW (ref 26.0–34.0)
MCHC: 31 g/dL (ref 30.0–36.0)
MCV: 78.7 fL — ABNORMAL LOW (ref 80.0–100.0)
Platelets: 241 10*3/uL (ref 150–400)
RBC: 4.31 MIL/uL (ref 3.87–5.11)
RDW: 23 % — ABNORMAL HIGH (ref 11.5–15.5)
WBC: 9.1 10*3/uL (ref 4.0–10.5)
nRBC: 0 % (ref 0.0–0.2)

## 2020-01-30 LAB — BASIC METABOLIC PANEL
Anion gap: 13 (ref 5–15)
BUN: 19 mg/dL (ref 6–20)
CO2: 22 mmol/L (ref 22–32)
Calcium: 8.8 mg/dL — ABNORMAL LOW (ref 8.9–10.3)
Chloride: 103 mmol/L (ref 98–111)
Creatinine, Ser: 1.54 mg/dL — ABNORMAL HIGH (ref 0.44–1.00)
GFR calc Af Amer: 47 mL/min — ABNORMAL LOW (ref >60)
GFR calc non Af Amer: 41 mL/min — ABNORMAL LOW (ref >60)
Glucose, Bld: 343 mg/dL — ABNORMAL HIGH (ref 70–99)
Potassium: 3.5 mmol/L (ref 3.5–5.1)
Sodium: 138 mmol/L (ref 135–145)

## 2020-01-30 LAB — HEMOGLOBIN A1C
Hgb A1c MFr Bld: 9.5 % — ABNORMAL HIGH (ref 4.8–5.6)
Mean Plasma Glucose: 225.95 mg/dL

## 2020-01-30 LAB — LACTIC ACID, PLASMA
Lactic Acid, Venous: 5.2 mmol/L (ref 0.5–1.9)
Lactic Acid, Venous: 2.1 mmol/L (ref 0.5–1.9)

## 2020-01-30 LAB — GLUCOSE, CAPILLARY
Glucose-Capillary: 337 mg/dL — ABNORMAL HIGH (ref 70–99)
Glucose-Capillary: 330 mg/dL — ABNORMAL HIGH (ref 70–99)
Glucose-Capillary: 233 mg/dL — ABNORMAL HIGH (ref 70–99)
Glucose-Capillary: 166 mg/dL — ABNORMAL HIGH (ref 70–99)

## 2020-01-30 MED ORDER — POTASSIUM CHLORIDE CRYS ER 20 MEQ PO TBCR
40.0000 meq | EXTENDED_RELEASE_TABLET | Freq: Once | ORAL | Status: AC
  Administered 2020-01-30: 40 meq via ORAL
  Filled 2020-01-30: qty 2

## 2020-01-30 MED ORDER — AMLODIPINE BESYLATE 10 MG PO TABS
10.0000 mg | ORAL_TABLET | Freq: Every day | ORAL | Status: DC
  Administered 2020-01-30 – 2020-02-03 (×4): 10 mg via ORAL
  Filled 2020-01-30 (×5): qty 1

## 2020-01-30 MED ORDER — SODIUM CHLORIDE 0.9 % IV SOLN
25.0000 mg | Freq: Once | INTRAVENOUS | Status: DC
  Filled 2020-01-30: qty 0.5

## 2020-01-30 MED ORDER — SODIUM CHLORIDE 0.9 % IV SOLN: INTRAVENOUS | Status: AC

## 2020-01-30 MED ORDER — MAGNESIUM SULFATE 2 GM/50ML IV SOLN
2.0000 g | Freq: Once | INTRAVENOUS | Status: AC
  Administered 2020-01-30: 2 g via INTRAVENOUS
  Filled 2020-01-30: qty 50

## 2020-01-30 MED ORDER — METHOCARBAMOL 500 MG PO TABS
500.0000 mg | ORAL_TABLET | Freq: Once | ORAL | Status: AC
  Administered 2020-01-30: 500 mg via ORAL
  Filled 2020-01-30: qty 1

## 2020-01-30 MED ORDER — KETOROLAC TROMETHAMINE 30 MG/ML IJ SOLN
30.0000 mg | Freq: Once | INTRAMUSCULAR | Status: AC
  Administered 2020-01-30: 30 mg via INTRAVENOUS
  Filled 2020-01-30: qty 1

## 2020-01-30 MED ORDER — PROMETHAZINE HCL 25 MG/ML IJ SOLN
25.0000 mg | Freq: Once | INTRAMUSCULAR | Status: AC
  Administered 2020-01-30: 25 mg via INTRAVENOUS
  Filled 2020-01-30: qty 1

## 2020-01-30 MED ORDER — DIPHENHYDRAMINE HCL 50 MG/ML IJ SOLN
25.0000 mg | Freq: Once | INTRAMUSCULAR | Status: AC
  Administered 2020-01-30: 25 mg via INTRAVENOUS
  Filled 2020-01-30: qty 1

## 2020-01-30 MED ORDER — KETOROLAC TROMETHAMINE 15 MG/ML IJ SOLN
15.0000 mg | Freq: Once | INTRAMUSCULAR | Status: AC
  Administered 2020-01-30: 15 mg via INTRAVENOUS
  Filled 2020-01-30: qty 1

## 2020-01-30 MED ORDER — LORAZEPAM 2 MG/ML IJ SOLN: 1.0000 mg | Freq: Once | INTRAMUSCULAR | Status: DC | PRN

## 2020-01-30 MED ORDER — POTASSIUM CHLORIDE 2 MEQ/ML IV SOLN
INTRAVENOUS | Status: DC
  Filled 2020-01-30 (×2): qty 1000

## 2020-01-30 NOTE — Progress Notes (Signed)
Portable EEG completed, results pending. 

## 2020-01-30 NOTE — Progress Notes (Signed)
Nutrition Brief Note  RD consulted for assessment of nutritional needs/ status secondary to obesity.   Wt Readings from Last 15 Encounters:  01/29/20 122.5 kg  12/26/19 128.8 kg  12/24/19 129.7 kg  12/19/19 122.5 kg  11/24/19 130.6 kg  11/05/19 130.3 kg  09/16/19 122.5 kg  08/29/19 127.9 kg  08/20/19 128.3 kg  08/08/19 128.3 kg  08/05/19 127.5 kg  07/03/19 127.5 kg  06/06/19 126.1 kg  05/27/19 124.1 kg  05/22/19 130 kg   Jill Shaw is a 44 y.o. female with medical history significant of morbid obesity; tobacco dependence; seizures; HTN; DM; and COPD presenting with AMS.  Her mother said that she passed out, was unconscious last night.  She reports that she has had a couple of falls in the last 2 weeks, hitting her head.  She was "supposed to get an MRI last night."  Normal day yesterday but she is having bad headaches and if she leans to the side she feels pressure.  If she is laying down she is ok, but notices problems when she gets up to walk.  She remembers going to the store, came home, ate dinner; she folded some clothes; and watched TV until about 8-9pm.  She folded some more clothes.  She doesn't remember anything after that.  When she woke up she was in CT.  She feels ok now but still frontal headache.  +n/v.  +photophobia.  Pt admitted with acute metabolic encephalopathy.   Per chart review, pt has been complaining of migraines.   Attempted to speak with pt via phone, however, no answer. Given pt's encephalopathy and migraine pain, suspect pt would retain minimal information at this time.   Lab Results  Component Value Date   HGBA1C 8.1 (A) 11/05/2019   PTA DM medications are 100 units insulin lispro portamine lispro BID and 2.5 mg sitagliptin HCL daily.   Labs reviewed: CBGS: 234-337 (inpatient orders for glycemic control are 0-20 units insulin aspart TID with meals, 0-5 units insulin aspart q HS, and 100 units inuslin aspart protamine aspart BID).   Given pt's  poorly controlled DM, education was focused on DM. Attached "Carbohydrate Counting for People with Diabetes" handout to AVS/ discharge summary. Also referred pt to [facility]'s Nutrition and Diabetes Education Services.   Body mass index is 46.35 kg/m. Patient meets criteria for morbid obesity based on current BMI. Obesity is a complex, chronic medical condition that is optimally managed by a multidisciplinary care team. Weight loss is not an ideal goal for an acute inpatient hospitalization. However, if further work-up for obesity is warranted, consider outpatient referral to outpatient bariatric service and/or [facility]'s Nutrition and Diabetes Education Services.   Current diet order is carb modified, patient is consuming approximately 100% of meals at this time. Labs and medications reviewed.   No nutrition interventions warranted at this time. If nutrition issues arise, please consult RD.   Levada Schilling, RD, LDN, CDCES Registered Dietitian II Certified Diabetes Care and Education Specialist Please refer to [facility] for RD and/or RD on-call/weekend/after hours pager

## 2020-01-30 NOTE — Progress Notes (Signed)
PROGRESS NOTE                                                                                                                                                                                                             Patient Demographics:    Jill Shaw, is a 44 y.o. female, DOB - Aug 26, 1976, YCX:[id number]  Admit date - 01/29/2020   Admitting Physician Eduard Clos, MD  Outpatient Primary MD for the patient is Barbette Merino, NP  LOS - 0  Chief Complaint  Patient presents with  . Altered Mental Status       Brief Narrative - Jill Shaw is a 44 y.o. female with medical history significant of morbid obesity; tobacco dependence; seizures; HTN; DM; and COPD presenting with AMS.  Her mother said that she passed out, was unconscious last night, apparently she has had a few falls over the last few days, has chronic right lower extremity weakness which he attributes to arthritis and neuropathy.  Currently no headache or new focal weakness, apparently patient was due for outpatient MRI as well.  In the ER her work-up for falls suggested AKI and she was admitted to the hospital.   Subjective:    Jill Shaw today has, No headache, No chest pain, No abdominal pain - No Nausea, No new weakness tingling or numbness, No Cough - SOB.     Assessment  & Plan :     1.  Metabolic encephalopathy associated with multiple falls & AKI - she likely has some element of dehydration along could have been worse due to underlying use of narcotics and muscle relaxers.  Currently no focal deficits, CT head and C-spine unremarkable, EEG unremarkable, MRI pending.  Will check orthostatics, IV fluids, PT OT, advance activity and monitor, elevated lactate is due to severe dehydration, hydrate and monitor lactic acid in the morning.  2. AKI  With hypokalemia.  Due to dehydration hydrate with IV fluids replace potassium and monitor.  Hold ACE and NSAIDs.  3.  Chronic pain.  Supportive care.   Minimize narcotics and benzo use.  4.  PD.  Supportive care no wheezing.  5.  Essential hypertension.  Blood pressure was low upon admission, Norvasc and monitor.  6.  Morbid obesity with BMI of 46.  Follow with PCP for weight loss.  7.  Smoking.  Counseled to quit.  8.  History of seizures in the remote past.  States this was in the setting of hypoglycemia, question if  they were true seizures, EEG unremarkable.  9.  DM type II.  Home dose 75/25 continued currently on sliding scale will add premeal NovoLog for better control and monitor.  Lab Results  Component Value Date   HGBA1C 8.1 (A) 11/05/2019   CBG (last 3)  Recent Labs    01/29/20 1614 01/29/20 2114 01/30/20 0619  GLUCAP 129* 234* 337*      Family Communication  :  None  Code Status :  Full  Disposition Plan  : Stay in the hospital for evaluation of recurrent falls, AKI.  Consults  :  None  Procedures  :    CT head and C-spine.  Non Acute  EEG.  No seizure focus.  MRI brain -   DVT Prophylaxis  :  Lovenox    Lab Results  Component Value Date   PLT 241 01/30/2020    Diet :  Diet Order            Diet Carb Modified Fluid consistency: Thin; Room service appropriate? Yes  Diet effective now               Inpatient Medications Scheduled Meds: . docusate sodium  100 mg Oral BID  . enoxaparin (LOVENOX) injection  40 mg Subcutaneous Q24H  . fluticasone  2 spray Each Nare Daily  . insulin aspart  0-20 Units Subcutaneous TID WC  . insulin aspart  0-5 Units Subcutaneous QHS  . insulin aspart protamine- aspart  100 Units Subcutaneous BID WC  . loratadine  10 mg Oral Daily  . LORazepam  1 mg Intravenous Once  . mometasone-formoterol  2 puff Inhalation BID  . nicotine  14 mg Transdermal Daily  . norethindrone  10 mg Oral Q8H  . pantoprazole  40 mg Oral Daily  . sodium chloride flush  3 mL Intravenous Q12H   Continuous Infusions: . lactated ringers with kcl     PRN Meds:.acetaminophen **OR**  [DISCONTINUED] acetaminophen, albuterol, bisacodyl, hydrALAZINE, LORazepam, [DISCONTINUED] ondansetron **OR** ondansetron (ZOFRAN) IV, polyethylene glycol  Antibiotics  :   Anti-infectives (From admission, onward)   Start     Dose/Rate Route Frequency Ordered Stop   01/29/20 0545  vancomycin (VANCOREADY) IVPB 2000 mg/400 mL     2,000 mg 200 mL/hr over 120 Minutes Intravenous  Once 01/29/20 0535 01/29/20 0856   01/29/20 0530  ceFEPIme (MAXIPIME) 2 g in sodium chloride 0.9 % 100 mL IVPB     2 g 200 mL/hr over 30 Minutes Intravenous  Once 01/29/20 0529 01/29/20 0652   01/29/20 0530  metroNIDAZOLE (FLAGYL) IVPB 500 mg     500 mg 100 mL/hr over 60 Minutes Intravenous  Once 01/29/20 0529 01/29/20 0955   01/29/20 0530  vancomycin (VANCOCIN) IVPB 1000 mg/200 mL premix  Status:  Discontinued     1,000 mg 200 mL/hr over 60 Minutes Intravenous  Once 01/29/20 0529 01/29/20 0535          Objective:   Vitals:   01/30/20 0444 01/30/20 0554 01/30/20 0918 01/30/20 1200  BP: (!) 184/72 (!) 155/49  (!) 157/67  Pulse: 91 (!) 114 86 (!) 108  Resp: 16  20 20   Temp: 98.6 F (37 C)   99.2 F (37.3 C)  TempSrc: Oral   Oral  SpO2: 98%  99% 100%  Weight:      Height:        SpO2: 100 %  Wt Readings from Last 3 Encounters:  01/29/20 122.5 kg  12/26/19 128.8 kg  12/24/19  129.7 kg     Intake/Output Summary (Last 24 hours) at 01/30/2020 1212 Last data filed at 01/30/2020 0700 Gross per 24 hour  Intake 1223.31 ml  Output --  Net 1223.31 ml     Physical Exam  Awake Alert, No new F.N deficits, Normal affect [place].AT,PERRAL Supple Neck,No JVD, No cervical lymphadenopathy appriciated.  Symmetrical Chest wall movement, Good air movement bilaterally, CTAB RRR,No Gallops,Rubs or new Murmurs, No Parasternal Heave +ve B.Sounds, Abd Soft, No tenderness, No organomegaly appriciated, No rebound - guarding or rigidity. No Cyanosis, Clubbing or edema, No new Rash or bruise      Data Review:     Recent Labs  Lab 01/29/20 0321 01/29/20 0430 01/29/20 0639 01/30/20 0351  WBC 11.5*  --   --  9.1  HGB 10.7* 12.2 12.6 10.5*  HCT 35.0* 36.0 37.0 33.9*  PLT 272  --   --  241  MCV 79.4*  --   --  78.7*  MCH 24.3*  --   --  24.4*  MCHC 30.6  --   --  31.0  RDW 22.9*  --   --  23.0*  LYMPHSABS 2.7  --   --   --   MONOABS 0.6  --   --   --   EOSABS 0.4  --   --   --   BASOSABS 0.1  --   --   --     Recent Labs  Lab 01/29/20 0321 01/29/20 0400 01/29/20 0430 01/29/20 0518 01/29/20 0639 01/30/20 0351 01/30/20 1106  NA 140  --  142  --  142 138  --   K 2.7*  --  2.6*  --  3.0* 3.5  --   CL 100  --   --   --   --  103  --   CO2 23  --   --   --   --  22  --   GLUCOSE 186*  --   --   --   --  343*  --   BUN 15  --   --   --   --  19  --   CREATININE 1.91*  --   --   --   --  1.54*  --   CALCIUM 9.0  --   --   --   --  8.8*  --   AST 36  --   --   --   --   --   --   ALT 12  --   --   --   --   --   --   ALKPHOS 67  --   --   --   --   --   --   BILITOT 0.7  --   --   --   --   --   --   ALBUMIN 3.2*  --   --   --   --   --   --   MG  --   --   --  1.6*  --   --  1.6*  AMMONIA  --  24  --   --   --   --   --     Recent Labs  Lab 01/29/20 0546  SARSCOV2NAA NEGATIVE    ------------------------------------------------------------------------------------------------------------------ No results for input(s): CHOL, HDL, LDLCALC, TRIG, CHOLHDL, LDLDIRECT in the last 72 hours.  Lab Results  Component Value Date   HGBA1C 8.1 (A) 11/05/2019   ------------------------------------------------------------------------------------------------------------------  No results for input(s): TSH, T4TOTAL, T3FREE, THYROIDAB in the last 72 hours.  Invalid input(s): FREET3 ------------------------------------------------------------------------------------------------------------------ No results for input(s): VITAMINB12, FOLATE, FERRITIN, TIBC, IRON, RETICCTPCT in the last 72  hours.  Coagulation profile No results for input(s): INR, PROTIME in the last 168 hours.  No results for input(s): DDIMER in the last 72 hours.  Cardiac Enzymes No results for input(s): CKMB, TROPONINI, MYOGLOBIN in the last 168 hours.  Invalid input(s): CK ------------------------------------------------------------------------------------------------------------------    Component Value Date/Time   BNP 13.2 12/26/2019 1457   BNP 66.7 10/20/2018 2047   BNP 67.5 03/30/2017 1438    Micro Results Recent Results (from the past 240 hour(s))  Respiratory Panel by RT PCR (Flu A&B, Covid) - Nasopharyngeal Swab     Status: None   Collection Time: 01/29/20  5:46 AM   Specimen: Nasopharyngeal Swab  Result Value Ref Range Status   SARS Coronavirus 2 by RT PCR NEGATIVE NEGATIVE Final    Comment: (NOTE) SARS-CoV-2 target nucleic acids are NOT DETECTED. The SARS-CoV-2 RNA is generally detectable in upper respiratoy specimens during the acute phase of infection. The lowest concentration of SARS-CoV-2 viral copies this assay can detect is 131 copies/mL. A negative result does not preclude SARS-Cov-2 infection and should not be used as the sole basis for treatment or other patient management decisions. A negative result may occur with  improper specimen collection/handling, submission of specimen other than nasopharyngeal swab, presence of viral mutation(s) within the areas targeted by this assay, and inadequate number of viral copies (<131 copies/mL). A negative result must be combined with clinical observations, patient history, and epidemiological information. The expected result is Negative. Fact Sheet for Patients:  https://www.moore.com/ Fact Sheet for Healthcare Providers:  https://www.young.biz/ This test is not yet ap proved or cleared by the [place] FDA and  has been authorized for detection and/or diagnosis of SARS-CoV-2 by FDA under  an Emergency Use Authorization (EUA). This EUA will remain  in effect (meaning this test can be used) for the duration of the COVID-19 declaration under Section 564(b)(1) of the Act, 21 U.S.C. section 360bbb-3(b)(1), unless the authorization is terminated or revoked sooner.    Influenza A by PCR NEGATIVE NEGATIVE Final   Influenza B by PCR NEGATIVE NEGATIVE Final    Comment: (NOTE) The Xpert Xpress SARS-CoV-2/FLU/RSV assay is intended as an aid in  the diagnosis of influenza from Nasopharyngeal swab specimens and  should not be used as a sole basis for treatment. Nasal washings and  aspirates are unacceptable for Xpert Xpress SARS-CoV-2/FLU/RSV  testing. Fact Sheet for Patients: https://www.moore.com/ Fact Sheet for Healthcare Providers: https://www.young.biz/ This test is not yet approved or cleared by the [place] FDA and  has been authorized for detection and/or diagnosis of SARS-CoV-2 by  FDA under an Emergency Use Authorization (EUA). This EUA will remain  in effect (meaning this test can be used) for the duration of the  Covid-19 declaration under Section 564(b)(1) of the Act, 21  U.S.C. section 360bbb-3(b)(1), unless the authorization is  terminated or revoked. Performed at [facility], [address] [place], [place] [address]   Blood culture (routine x 2)     Status: None (Preliminary result)   Collection Time: 01/29/20  8:15 AM   Specimen: BLOOD LEFT HAND  Result Value Ref Range Status   Specimen Description BLOOD LEFT HAND  Final   Special Requests   Final    BOTTLES DRAWN AEROBIC AND ANAEROBIC Blood Culture adequate volume  Culture   Final    NO GROWTH < 12 HOURS Performed at [facility], [place] [address] [place], [place] [address]    Report Status PENDING  Incomplete  Blood culture (routine x 2)     Status: None (Preliminary result)   Collection Time: 01/29/20  8:20 AM   Specimen: BLOOD LEFT ARM  Result  Value Ref Range Status   Specimen Description BLOOD LEFT ARM  Final   Special Requests   Final    BOTTLES DRAWN AEROBIC AND ANAEROBIC Blood Culture adequate volume   Culture   Final    NO GROWTH < 12 HOURS Performed at [facility], [place] [address] [place], [place] [address]    Report Status PENDING  Incomplete    Radiology Reports DG Abdomen 1 View  Result Date: 01/29/2020 CLINICAL DATA:  Sickle cell.  Altered mental status EXAM: ABDOMEN - 1 VIEW COMPARISON:  None. FINDINGS: The nonspecific bowel gas pattern without evidence of obstruction. Air is seen within large and small bowel loops throughout the abdomen. No radio-opaque calculi or other significant radiographic abnormality are seen. No acute osseous findings. IMPRESSION: Nonspecific bowel gas pattern. Electronically Signed   By: Davina Poke D.O.   On: 01/29/2020 13:34   CT Head Wo Contrast  Result Date: 01/29/2020 CLINICAL DATA:  . Found unresponsive. Recent fall. EXAM: CT HEAD WITHOUT CONTRAST CT CERVICAL SPINE WITHOUT CONTRAST TECHNIQUE: Multidetector CT imaging of the head and cervical spine was performed following the standard protocol without intravenous contrast. Multiplanar CT image reconstructions of the cervical spine were also generated. COMPARISON:  None. FINDINGS: CT HEAD FINDINGS Brain: There is no mass, hemorrhage or extra-axial collection. The size and configuration of the ventricles and extra-axial CSF spaces are normal. The brain parenchyma is normal, without evidence of acute or chronic infarction. Vascular: No abnormal hyperdensity of the major intracranial arteries or dural venous sinuses. No intracranial atherosclerosis. Skull: The visualized skull base, calvarium and extracranial soft tissues are normal. Sinuses/Orbits: No fluid levels or advanced mucosal thickening of the visualized paranasal sinuses. No mastoid or middle ear effusion. The orbits are normal. CT CERVICAL SPINE FINDINGS Alignment: No static  subluxation. Facets are aligned. Occipital condyles are normally positioned. Skull base and vertebrae: No acute fracture. Soft tissues and spinal canal: No prevertebral fluid or swelling. No visible canal hematoma. Disc levels: No advanced spinal canal or neural foraminal stenosis. Upper chest: No pneumothorax, pulmonary nodule or pleural effusion. Other: Normal visualized paraspinal cervical soft tissues. IMPRESSION: 1. No acute intracranial abnormality. 2. No acute fracture or static subluxation of the cervical spine. Electronically Signed   By: Ulyses Jarred M.D.   On: 01/29/2020 03:58   CT Cervical Spine Wo Contrast  Result Date: 01/29/2020 CLINICAL DATA:  . Found unresponsive. Recent fall. EXAM: CT HEAD WITHOUT CONTRAST CT CERVICAL SPINE WITHOUT CONTRAST TECHNIQUE: Multidetector CT imaging of the head and cervical spine was performed following the standard protocol without intravenous contrast. Multiplanar CT image reconstructions of the cervical spine were also generated. COMPARISON:  None. FINDINGS: CT HEAD FINDINGS Brain: There is no mass, hemorrhage or extra-axial collection. The size and configuration of the ventricles and extra-axial CSF spaces are normal. The brain parenchyma is normal, without evidence of acute or chronic infarction. Vascular: No abnormal hyperdensity of the major intracranial arteries or dural venous sinuses. No intracranial atherosclerosis. Skull: The visualized skull base, calvarium and extracranial soft tissues are normal. Sinuses/Orbits: No fluid levels or advanced mucosal thickening of the visualized  paranasal sinuses. No mastoid or middle ear effusion. The orbits are normal. CT CERVICAL SPINE FINDINGS Alignment: No static subluxation. Facets are aligned. Occipital condyles are normally positioned. Skull base and vertebrae: No acute fracture. Soft tissues and spinal canal: No prevertebral fluid or swelling. No visible canal hematoma. Disc levels: No advanced spinal canal or  neural foraminal stenosis. Upper chest: No pneumothorax, pulmonary nodule or pleural effusion. Other: Normal visualized paraspinal cervical soft tissues. IMPRESSION: 1. No acute intracranial abnormality. 2. No acute fracture or static subluxation of the cervical spine. Electronically Signed   By: Deatra Robinson M.D.   On: 01/29/2020 03:58   DG Chest Portable 1 View  Result Date: 01/29/2020 CLINICAL DATA:  Altered mental status EXAM: PORTABLE CHEST 1 VIEW COMPARISON:  02/07/2019 FINDINGS: Cardiomegaly accentuated by portable technique. Minimal streaky density at the right base, likely atelectasis. Negative aortic and hilar contours for technique. No effusion or pneumothorax. IMPRESSION: Cardiomegaly without failure. Probable mild atelectasis on the right. Electronically Signed   By: Marnee Spring M.D.   On: 01/29/2020 04:46   EEG adult  Result Date: 01/30/2020 Charlsie Quest, MD     01/30/2020 11:16 AM Patient Name: JACQUELINA HEWINS MRN: [id number] Epilepsy Attending: Charlsie Quest Referring Physician/Provider: Dr. Susa Raring Date: 01/30/2020 Duration: 23.51 mins Patient history: 44 year old female with history of diabetes, polysubstance abuse, seizures presented with altered mental status.  EEG evaluate for seizures. Level of alertness: Awake AEDs during EEG study: None Technical aspects: This EEG study was done with scalp electrodes positioned according to the 10-20 International system of electrode placement. Electrical activity was acquired at a sampling rate of  and reviewed with a high frequency filter of  and a low frequency filter of . EEG data were recorded continuously and digitally stored. Description: Although difficult to appreciate due to significant electrode artifact throughout the EEG, the posterior dominant rhythm of 9 Hz activity of moderate voltage (25-35 uV) seen predominantly in posterior head regions, symmetric was noted.  Hyperventilation and photic stimulation were  not performed. Of note, EEG was technically difficult due to eyelid flutter artifact throughout the study. IMPRESSION: This technically difficult study is within normal limits. No seizures or epileptiform discharges were seen throughout the recording. Priyanka Annabelle Harman    Time Spent in minutes  30   Susa Raring M.D on 01/30/2020 at 12:12 PM  To page go to [email or web address] - password [email or web address]

## 2020-01-30 NOTE — Progress Notes (Signed)
OT Cancellation Note  Patient Details Name: Jill Shaw MRN: [id number] DOB: 17-Dec-1975   Cancelled Treatment:    Reason Eval/Treat Not Completed: Medical issues which prohibited therapy(pt with nausea and vomiting)  Kjirsten, Bloodgood 01/30/2020, 1:55 PM  Martie Round, OTR/L Acute Rehabilitation Services Pager: [phone] Office: [phone]

## 2020-01-30 NOTE — Discharge Instructions (Addendum)
Follow with Primary MD Barbette Merino, NP in 7 days, you need a dedicated liver MRI in 2 to 3 weeks, request your primary care physician to arrange for it.  Get CBC, CMP, 2 view Chest X ray -  checked next visit within 1 week by Primary MD    Activity: As tolerated with Full fall precautions use walker/cane & assistance as needed  Disposition Home    Diet: Heart Healthy  Low Carb  Accuchecks 4 times/day, Once in AM empty stomach and then before each meal. Log in all results and show them to your Prim.MD in 3 days. If any glucose reading is under 80 or above 300 call your Prim MD immidiately. Follow Low glucose instructions for glucose under 80 as instructed.   Special Instructions: If you have smoked or chewed Tobacco  in the last 2 yrs please stop smoking, stop any regular Alcohol  and or any Recreational drug use.  On your next visit with your primary care physician please Get Medicines reviewed and adjusted.  Please request your Prim.MD to go over all Hospital Tests and Procedure/Radiological results at the follow up, please get all Hospital records sent to your Prim MD by signing hospital release before you go home.  If you experience worsening of your admission symptoms, develop shortness of breath, life threatening emergency, suicidal or homicidal thoughts you must seek medical attention immediately by calling 911 or calling your MD immediately  if symptoms less severe.  You Must read complete instructions/literature along with all the possible adverse reactions/side effects for all the Medicines you take and that have been prescribed to you. Take any new Medicines after you have completely understood and accpet all the possible adverse reactions/side effects.   Do not drive, operate heavy machinery, perform activities at heights, swimming or participation in water activities or provide baby sitting services if your were admitted for syncope or siezures until you have seen by Primary  MD or a Neurologist and advised to do so again.  Do not drive when taking Pain medications.  Do not take more than prescribed Pain, Sleep and Anxiety Medications    Carbohydrate Counting For People With Diabetes  Foods with carbohydrates make your blood glucose level go up. Learning how to count carbohydrates can help you control your blood glucose levels. First, identify the foods you eat that contain carbohydrates. Then, using the Foods with Carbohydrates chart, determine about how much carbohydrates are in your meals and snacks. Make sure you are eating foods with fiber, protein, and healthy fat along with your carbohydrate foods. Foods with Carbohydrates The following table shows carbohydrate foods that have about 15 grams of carbohydrate each. Using measuring cups, spoons, or a food scale when you first begin learning about carbohydrate counting can help you learn about the portion sizes you typically eat. The following foods have 15 grams carbohydrate each:  Grains . 1 slice bread (1 ounce)  . 1 small tortilla (6-inch size)  .  large bagel (1 ounce)  . 1/3 cup pasta or rice (cooked)  .  hamburger or hot dog bun ( ounce)  .  cup cooked cereal  .  to  cup ready-to-eat cereal  . 2 taco shells (5-inch size) Fruit . 1 small fresh fruit ( to 1 cup)  .  medium banana  . 17 small grapes (3 ounces)  . 1 cup melon or berries  .  cup canned or frozen fruit  . 2 tablespoons dried fruit (blueberries, cherries,  cranberries, raisins)  .  cup unsweetened fruit juice  Starchy Vegetables .  cup cooked beans, peas, corn, potatoes/sweet potatoes  .  large baked potato (3 ounces)  . 1 cup acorn or butternut squash  Snack Foods . 3 to 6 crackers  . 8 potato chips or 13 tortilla chips ( ounce to 1 ounce)  . 3 cups popped popcorn  Dairy . 3/4 cup (6 ounces) nonfat plain yogurt, or yogurt with sugar-free sweetener  . 1 cup milk  . 1 cup plain rice, soy, coconut or flavored almond milk  Sweets and Desserts .  cup ice cream or frozen yogurt  . 1 tablespoon jam, jelly, pancake syrup, table sugar, or honey  . 2 tablespoons light pancake syrup  . 1 inch square of frosted cake or 2 inch square of unfrosted cake  . 2 small cookies (2/3 ounce each) or  large cookie  Sometimes you'll have to estimate carbohydrate amounts if you don't know the exact recipe. One cup of mixed foods like soups can have 1 to 2 carbohydrate servings, while some casseroles might have 2 or more servings of carbohydrate. Foods that have less than 20 calories in each serving can be counted as "free" foods. Count 1 cup raw vegetables, or  cup cooked non-starchy vegetables as "free" foods. If you eat 3 or more servings at one meal, then count them as 1 carbohydrate serving.  Foods without Carbohydrates  Not all foods contain carbohydrates. Meat, some dairy, fats, non-starchy vegetables, and many beverages don't contain carbohydrate. So when you count carbohydrates, you can generally exclude chicken, pork, beef, fish, seafood, eggs, tofu, cheese, butter, sour cream, avocado, nuts, seeds, olives, mayonnaise, water, black coffee, unsweetened tea, and zero-calorie drinks. Vegetables with no or low carbohydrate include green beans, cauliflower, tomatoes, and onions. How much carbohydrate should I eat at each meal?  Carbohydrate counting can help you plan your meals and manage your weight. Following are some starting points for carbohydrate intake at each meal. Work with your registered dietitian nutritionist to find the best range that works for your blood glucose and weight.   To Lose Weight To Maintain Weight  Women 2 - 3 carb servings 3 - 4 carb servings  Men 3 - 4 carb servings 4 - 5 carb servings  Checking your blood glucose after meals will help you know if you need to adjust the timing, type, or number of carbohydrate servings in your meal plan. Achieve and keep a healthy body weight by balancing your food intake  and physical activity.  Tips How should I plan my meals?  Plan for half the food on your plate to include non-starchy vegetables, like salad greens, broccoli, or carrots. Try to eat 3 to 5 servings of non-starchy vegetables every day. Have a protein food at each meal. Protein foods include chicken, fish, meat, eggs, or beans (note that beans contain carbohydrate). These two food groups (non-starchy vegetables and proteins) are low in carbohydrate. If you fill up your plate with these foods, you will eat less carbohydrate but still fill up your stomach. Try to limit your carbohydrate portion to  of the plate.  What fats are healthiest to eat?  Diabetes increases risk for heart disease. To help protect your heart, eat more healthy fats, such as olive oil, nuts, and avocado. Eat less saturated fats like butter, cream, and high-fat meats, like bacon and sausage. Avoid trans fats, which are in all foods that list "partially hydrogenated oil" as an  ingredient. What should I drink?  Choose drinks that are not sweetened with sugar. The healthiest choices are water, carbonated or seltzer waters, and tea and coffee without added sugars.  Sweet drinks will make your blood glucose go up very quickly. One serving of soda or energy drink is  cup. It is best to drink these beverages only if your blood glucose is low.  Artificially sweetened, or diet drinks, typically do not increase your blood glucose if they have zero calories in them. Read labels of beverages, as some diet drinks do have carbohydrate and will raise your blood glucose. Label Reading Tips Read Nutrition Facts labels to find out how many grams of carbohydrate are in a food you want to eat. Don't forget: sometimes serving sizes on the label aren't the same as how much food you are going to eat, so you may need to calculate how much carbohydrate is in the food you are serving yourself.   Carbohydrate Counting for People with Diabetes Sample 1-Day Menu   Breakfast  cup yogurt, low fat, low sugar (1 carbohydrate serving)   cup cereal, ready-to-eat, unsweetened (1 carbohydrate serving)  1 cup strawberries (1 carbohydrate serving)   cup almonds ( carbohydrate serving)  Lunch 1, 5 ounce can chunk light tuna  2 ounces cheese, low fat cheddar  6 whole wheat crackers (1 carbohydrate serving)  1 small apple (1 carbohydrate servings)   cup carrots ( carbohydrate serving)   cup snap peas  1 cup 1% milk (1 carbohydrate serving)   Evening Meal Stir fry made with: 3 ounces chicken  1 cup brown rice (3 carbohydrate servings)   cup broccoli ( carbohydrate serving)   cup green beans   cup onions  1 tablespoon olive oil  2 tablespoons teriyaki sauce ( carbohydrate serving)  Evening Snack 1 extra small banana (1 carbohydrate serving)  1 tablespoon peanut butter   Carbohydrate Counting for People with Diabetes Vegan Sample 1-Day Menu  Breakfast 1 cup cooked oatmeal (2 carbohydrate servings)   cup blueberries (1 carbohydrate serving)  2 tablespoons flaxseeds  1 cup soymilk fortified with calcium and vitamin D  1 cup coffee  Lunch 2 slices whole wheat bread (2 carbohydrate servings)   cup baked tofu   cup lettuce  2 slices tomato  2 slices avocado   cup baby carrots ( carbohydrate serving)  1 orange (1 carbohydrate serving)  1 cup soymilk fortified with calcium and vitamin D   Evening Meal Burrito made with: 1 6-inch corn tortilla (1 carbohydrate serving)  1 cup refried vegetarian beans (2 carbohydrate servings)   cup chopped tomatoes   cup lettuce   cup salsa  1/3 cup brown rice (1 carbohydrate serving)  1 tablespoon olive oil for rice   cup zucchini   Evening Snack 6 small whole grain crackers (1 carbohydrate serving)  2 apricots ( carbohydrate serving)   cup unsalted peanuts ( carbohydrate serving)    Carbohydrate Counting for People with Diabetes Vegetarian (Lacto-Ovo) Sample 1-Day Menu  Breakfast 1 cup  cooked oatmeal (2 carbohydrate servings)   cup blueberries (1 carbohydrate serving)  2 tablespoons flaxseeds  1 egg  1 cup 1% milk (1 carbohydrate serving)  1 cup coffee  Lunch 2 slices whole wheat bread (2 carbohydrate servings)  2 ounces low-fat cheese   cup lettuce  2 slices tomato  2 slices avocado   cup baby carrots ( carbohydrate serving)  1 orange (1 carbohydrate serving)  1 cup unsweetened tea  Evening  Meal Burrito made with: 1 6-inch corn tortilla (1 carbohydrate serving)   cup refried vegetarian beans (1 carbohydrate serving)   cup tomatoes   cup lettuce   cup salsa  1/3 cup brown rice (1 carbohydrate serving)  1 tablespoon olive oil for rice   cup zucchini  1 cup 1% milk (1 carbohydrate serving)  Evening Snack 6 small whole grain crackers (1 carbohydrate serving)  2 apricots ( carbohydrate serving)   cup unsalted peanuts ( carbohydrate serving)    Copyright 2020  Academy of [employer]etics. All rights reserved.  Using Nutrition Labels: Carbohydrate  . Serving Size  . Look at the serving size. All the information on the label is based on this portion. Jolyne Loa Per Container  . The number of servings contained in the package. . Guidelines for Carbohydrate  . Look at the total grams of carbohydrate in the serving size.  . 1 carbohydrate choice = 15 grams of carbohydrate. Range of Carbohydrate Grams Per Choice  Carbohydrate Grams/Choice Carbohydrate Choices  6-10   11-20 1  21-25 1  26-35 2  36-40 2  41-50 3  51-55 3  56-65 4  66-70 4  71-80 5    Copyright 2020  Academy of [employer] and Dietetics. All rights reserved.

## 2020-01-30 NOTE — Progress Notes (Signed)
PT Cancellation Note  Patient Details Name: Jill Shaw MRN: [id number] DOB: 07-27-1976   Cancelled Treatment:    Reason Eval/Treat Not Completed: Medical issues which prohibited therapy. Pt with nausea and vomiting.   Angelina Ok Maycok 01/30/2020, 1:16 PM Skip Mayer PT Acute Rehabilitation Services Pager [phone] Office [phone]

## 2020-01-30 NOTE — Progress Notes (Addendum)
Inpatient Diabetes Program Recommendations  AACE/ADA: New Consensus Statement on Inpatient Glycemic Control (2015)  Target Ranges:  Prepandial:   less than 140 mg/dL      Peak postprandial:   less than 180 mg/dL (1-2 hours)      Critically ill patients:  140 - 180 mg/dL   Lab Results  Component Value Date   GLUCAP 330 (H) 01/30/2020   HGBA1C 9.5 (H) 01/30/2020    Review of Glycemic Control  Diabetes history:Type 2 Outpatient Diabetes medications: Humalog 70/30 insulin 100 units BID, Onglyza 2.5 mg daily Current orders for Inpatient glycemic control: 70/30 insulin 100 units BID, Novolog RESISTANT correction scale TID & HS.   Inpatient Diabetes Program Recommendations:    Spoke with patient at the bedside. States that she was diagnosed with gestational diabetes when pregnant and then it continued after pregnancy. Was diagnosed with diabetes about 24 years ago. Started on oral agents, but has been on Humalog 70/30 insulin for about 4 years. Has been on Lantus before, but states that it did not work. Checks blood sugars 2-3 times per day at home. States that she is waiting to get her Medicaid restarted, but gets her insulin through the [employer]. PCP is Thad Ranger, NP and she has an appointment with her soon. Last saw her about 3 months ago.   Will continue to monitor blood sugars while in the hospital.  Smith Mince RN BSN CDE Diabetes Coordinator Pager: [phone]  8am-5pm

## 2020-01-30 NOTE — Progress Notes (Signed)
[facility] notified that patient bp 184/72 91 Hydralazine 5 mg IV given as ordered. Patient home medication for BP are on hold. Ilean Skill LPN

## 2020-01-30 NOTE — Procedures (Addendum)
Patient Name: Jill Shaw  MRN: [id number]  Epilepsy Attending: Charlsie Quest  Referring Physician/Provider: Dr. Susa Raring Date: 01/30/2020 Duration: 23.51 mins  Patient history: 44 year old female with history of diabetes, polysubstance abuse, seizures presented with altered mental status.  EEG evaluate for seizures.  Level of alertness: Awake  AEDs during EEG study: None  Technical aspects: This EEG study was done with scalp electrodes positioned according to the 10-20 International system of electrode placement. Electrical activity was acquired at a sampling rate of 500Hz  and reviewed with a high frequency filter of 70Hz  and a low frequency filter of 1Hz . EEG data were recorded continuously and digitally stored.   Description: Although difficult to appreciate due to significant electrode artifact throughout the EEG, the posterior dominant rhythm of 9 Hz activity of moderate voltage (25-35 uV) seen predominantly in posterior head regions, symmetric was noted.  Hyperventilation and photic stimulation were not performed.  Of note, EEG was technically difficult due to eyelid flutter artifact throughout the study.  IMPRESSION: This technically difficult study is within normal limits. No seizures or epileptiform discharges were seen throughout the recording.  [name] 

## 2020-01-31 ENCOUNTER — Inpatient Hospital Stay (HOSPITAL_COMMUNITY): Payer: Medicaid Other

## 2020-01-31 LAB — CBC WITH DIFFERENTIAL/PLATELET
Abs Immature Granulocytes: 0.03 10*3/uL (ref 0.00–0.07)
Basophils Absolute: 0 10*3/uL (ref 0.0–0.1)
Basophils Relative: 0 %
Eosinophils Absolute: 0.4 10*3/uL (ref 0.0–0.5)
Eosinophils Relative: 4 %
HCT: 30.9 % — ABNORMAL LOW (ref 36.0–46.0)
Hemoglobin: 9.8 g/dL — ABNORMAL LOW (ref 12.0–15.0)
Immature Granulocytes: 0 %
Lymphocytes Relative: 24 %
Lymphs Abs: 2.2 10*3/uL (ref 0.7–4.0)
MCH: 24.8 pg — ABNORMAL LOW (ref 26.0–34.0)
MCHC: 31.7 g/dL (ref 30.0–36.0)
MCV: 78.2 fL — ABNORMAL LOW (ref 80.0–100.0)
Monocytes Absolute: 0.6 10*3/uL (ref 0.1–1.0)
Monocytes Relative: 6 %
Neutro Abs: 6 10*3/uL (ref 1.7–7.7)
Neutrophils Relative %: 66 %
Platelets: 219 10*3/uL (ref 150–400)
RBC: 3.95 MIL/uL (ref 3.87–5.11)
RDW: 23 % — ABNORMAL HIGH (ref 11.5–15.5)
WBC: 9.2 10*3/uL (ref 4.0–10.5)
nRBC: 0 % (ref 0.0–0.2)

## 2020-01-31 LAB — BASIC METABOLIC PANEL
Anion gap: 12 (ref 5–15)
BUN: 11 mg/dL (ref 6–20)
CO2: 20 mmol/L — ABNORMAL LOW (ref 22–32)
Calcium: 8.8 mg/dL — ABNORMAL LOW (ref 8.9–10.3)
Chloride: 109 mmol/L (ref 98–111)
Creatinine, Ser: 1.23 mg/dL — ABNORMAL HIGH (ref 0.44–1.00)
GFR calc Af Amer: >60 mL/min (ref >60)
GFR calc non Af Amer: 54 mL/min — ABNORMAL LOW (ref >60)
Glucose, Bld: 194 mg/dL — ABNORMAL HIGH (ref 70–99)
Potassium: 3.5 mmol/L (ref 3.5–5.1)
Sodium: 141 mmol/L (ref 135–145)

## 2020-01-31 LAB — BRAIN NATRIURETIC PEPTIDE: B Natriuretic Peptide: 105.9 pg/mL — ABNORMAL HIGH (ref 0.0–100.0)

## 2020-01-31 LAB — GLUCOSE, CAPILLARY
Glucose-Capillary: 228 mg/dL — ABNORMAL HIGH (ref 70–99)
Glucose-Capillary: 115 mg/dL — ABNORMAL HIGH (ref 70–99)
Glucose-Capillary: 164 mg/dL — ABNORMAL HIGH (ref 70–99)
Glucose-Capillary: 166 mg/dL — ABNORMAL HIGH (ref 70–99)

## 2020-01-31 LAB — LIPASE, BLOOD: Lipase: 20 U/L (ref 11–51)

## 2020-01-31 LAB — MAGNESIUM: Magnesium: 1.7 mg/dL (ref 1.7–2.4)

## 2020-01-31 MED ORDER — METHOCARBAMOL 500 MG PO TABS
500.0000 mg | ORAL_TABLET | Freq: Three times a day (TID) | ORAL | Status: DC | PRN
  Administered 2020-01-31 – 2020-02-01 (×4): 500 mg via ORAL
  Filled 2020-01-31 (×4): qty 1

## 2020-01-31 MED ORDER — HYDRALAZINE HCL 50 MG PO TABS
100.0000 mg | ORAL_TABLET | Freq: Three times a day (TID) | ORAL | Status: DC
  Administered 2020-01-31 – 2020-02-03 (×7): 100 mg via ORAL
  Filled 2020-01-31 (×9): qty 2

## 2020-01-31 MED ORDER — METOCLOPRAMIDE HCL 5 MG/ML IJ SOLN
10.0000 mg | Freq: Once | INTRAMUSCULAR | Status: AC
  Administered 2020-01-31: 10 mg via INTRAVENOUS
  Filled 2020-01-31: qty 2

## 2020-01-31 MED ORDER — POTASSIUM CHLORIDE CRYS ER 20 MEQ PO TBCR
40.0000 meq | EXTENDED_RELEASE_TABLET | Freq: Once | ORAL | Status: AC
  Administered 2020-01-31: 40 meq via ORAL
  Filled 2020-01-31: qty 2

## 2020-01-31 MED ORDER — PROMETHAZINE HCL 25 MG/ML IJ SOLN
12.5000 mg | Freq: Once | INTRAMUSCULAR | Status: AC
  Administered 2020-01-31: 12.5 mg via INTRAVENOUS
  Filled 2020-01-31: qty 1

## 2020-01-31 MED ORDER — IOHEXOL 300 MG/ML  SOLN
100.0000 mL | Freq: Once | INTRAMUSCULAR | Status: AC | PRN
  Administered 2020-01-31: 100 mL via INTRAVENOUS

## 2020-01-31 MED ORDER — LACTATED RINGERS IV SOLN: INTRAVENOUS | Status: DC

## 2020-01-31 MED ORDER — PROMETHAZINE HCL 25 MG/ML IJ SOLN
25.0000 mg | Freq: Three times a day (TID) | INTRAMUSCULAR | Status: DC | PRN
  Administered 2020-01-31 – 2020-02-01 (×2): 25 mg via INTRAVENOUS
  Filled 2020-01-31 (×2): qty 1

## 2020-01-31 MED ORDER — TRAMADOL HCL 50 MG PO TABS
50.0000 mg | ORAL_TABLET | Freq: Four times a day (QID) | ORAL | Status: DC | PRN
  Administered 2020-01-31 – 2020-02-01 (×3): 50 mg via ORAL
  Filled 2020-01-31 (×3): qty 1

## 2020-01-31 MED ORDER — MELATONIN 3 MG PO TABS
3.0000 mg | ORAL_TABLET | Freq: Every day | ORAL | Status: DC
  Administered 2020-01-31 – 2020-02-02 (×4): 3 mg via ORAL
  Filled 2020-01-31 (×4): qty 1

## 2020-01-31 NOTE — Progress Notes (Signed)
Bladder scan showed 91 cc in bladder

## 2020-01-31 NOTE — Progress Notes (Addendum)
PROGRESS NOTE                                                                                                                                                                                                             Patient Demographics:    Jill Shaw, is a 44 y.o. female, DOB - [birth year], ZJQ:[id number]  Admit date - 01/29/2020   Admitting Physician Eduard Clos, MD  Outpatient Primary MD for the patient is Barbette Merino, NP  LOS - 1  Chief Complaint  Patient presents with  . Altered Mental Status       Brief Narrative - Jill Shaw is a 44 y.o. female with medical history significant of morbid obesity; tobacco dependence; seizures; HTN; DM; and COPD presenting with AMS.  Her mother said that she passed out, was unconscious last night, apparently she has had a few falls over the last few days, has chronic right lower extremity weakness which he attributes to arthritis and neuropathy.  Currently no headache or new focal weakness, apparently patient was due for outpatient MRI as well.  In the ER her work-up for falls suggested AKI and she was admitted to the hospital.   Subjective:   Patient in bed, appears comfortable, denies any headache, no fever, no chest pain or pressure, no shortness of breath , no abdominal pain, mild nausea. No focal weakness.   Assessment  & Plan :     1.  Metabolic encephalopathy associated with multiple falls & AKI - she likely has some element of dehydration along could have been worse due to underlying use of narcotics and muscle relaxers.  Currently no focal deficits, CT head and C-spine unremarkable, EEG unremarkable, MRI non acute.  Pressure has improved after IV fluids for hydration, still wait for PT OT thorough eval, looks like she sat on the side of the bed with PT assistance but I still need full PT evaluation and documentation that she can ambulate without assistance, have requested the staff to assist today.  2. AKI  with hypokalemia.  Due to dehydration resolved after IV fluids, ACE inhibitors and NSAIDs held.  Lactic acid improving after hydration with IV fluids.  No other signs of infection, do not think she is septic.  3.  Chronic pain.  Supportive care.  Minimize narcotics and benzo use.  4.  COPD .  Supportive care no wheezing.  5.  Essential hypertension.  Blood pressure was low upon admission, Norvasc and monitor.  6.  Morbid obesity with BMI of 46.  Follow with PCP for weight loss.  7.  Smoking.  Counseled to quit.  8.  History of seizures in the remote past.  States this was in the setting of hypoglycemia, question if they were true seizures, EEG unremarkable.  9.  Mild nausea.  Exam benign.  Supportive care.   10.  DM type II.  Home dose 75/25 continued currently on sliding scale will add premeal NovoLog for better control and monitor.  Lab Results  Component Value Date   HGBA1C 9.5 (H) 01/30/2020   CBG (last 3)  Recent Labs    01/30/20 1702 01/30/20 2145 01/31/20 0617  GLUCAP 233* 166* 228*    Addendum patient continues to complain of ongoing nausea, question if she is withdrawing from either muscle relaxers or narcotics, exam remains benign, will repeat x-ray, bladder scan along with lipase, if x-ray unrevealing BMP check CT scan to rule out any ongoing GI pathology.    Family Communication  : Mother bedside on 01/31/2020  Code Status :  Full  Disposition Plan  : Stay in the hospital needs thorough PT OT eval to assess fall risk and ambulatory capacity.  Consults  :  None  Procedures  :    CT head and C-spine.  Non Acute  EEG.  No seizure focus.  MRI brain - no acute findings.  DVT Prophylaxis  :  Lovenox    Lab Results  Component Value Date   PLT 219 01/31/2020    Diet :  Diet Order            Diet Carb Modified Fluid consistency: Thin; Room service appropriate? Yes  Diet effective now               Inpatient Medications Scheduled Meds: .  amLODipine  10 mg Oral Daily  . docusate sodium  100 mg Oral BID  . enoxaparin (LOVENOX) injection  40 mg Subcutaneous Q24H  . fluticasone  2 spray Each Nare Daily  . hydrALAZINE  100 mg Oral Q8H  . insulin aspart  0-20 Units Subcutaneous TID WC  . insulin aspart  0-5 Units Subcutaneous QHS  . insulin aspart protamine- aspart  100 Units Subcutaneous BID WC  . loratadine  10 mg Oral Daily  . melatonin  3 mg Oral QHS  . mometasone-formoterol  2 puff Inhalation BID  . nicotine  14 mg Transdermal Daily  . norethindrone  10 mg Oral Q8H  . pantoprazole  40 mg Oral Daily  . potassium chloride  40 mEq Oral Once  . sodium chloride flush  3 mL Intravenous Q12H   Continuous Infusions:  PRN Meds:.acetaminophen **OR** [DISCONTINUED] acetaminophen, albuterol, bisacodyl, hydrALAZINE, methocarbamol, [DISCONTINUED] ondansetron **OR** ondansetron (ZOFRAN) IV, polyethylene glycol, promethazine  Antibiotics  :   Anti-infectives (From admission, onward)   Start     Dose/Rate Route Frequency Ordered Stop   01/29/20 0545  vancomycin (VANCOREADY) IVPB 2000 mg/400 mL     2,000 mg 200 mL/hr over 120 Minutes Intravenous  Once 01/29/20 0535 01/29/20 0856   01/29/20 0530  ceFEPIme (MAXIPIME) 2 g in sodium chloride 0.9 % 100 mL IVPB     2 g 200 mL/hr over 30 Minutes Intravenous  Once 01/29/20 0529 01/29/20 0652   01/29/20 0530  metroNIDAZOLE (FLAGYL) IVPB 500 mg     500 mg 100 mL/hr over 60 Minutes Intravenous  Once 01/29/20 0529 01/29/20 0955   01/29/20 0530  vancomycin (VANCOCIN) IVPB 1000 mg/200 mL premix  Status:  Discontinued     1,000 mg 200 mL/hr over 60 Minutes Intravenous  Once 01/29/20 0529 01/29/20 0535          Objective:   Vitals:   01/31/20 0327 01/31/20 0455 01/31/20 0831 01/31/20 1126  BP: (!) 157/65   (!) 160/116  Pulse: 100  94 (!) 104  Resp: 15 20 (!) 24 (!) 24  Temp: 98.2 F (36.8 C)   98.4 F (36.9 C)  TempSrc: Oral   Oral  SpO2: 98%  98% 99%  Weight:      Height:         SpO2: 99 %  Wt Readings from Last 3 Encounters:  01/29/20 122.5 kg  12/26/19 128.8 kg  12/24/19 129.7 kg     Intake/Output Summary (Last 24 hours) at 01/31/2020 1140 Last data filed at 01/30/2020 2146 Gross per 24 hour  Intake 480 ml  Output --  Net 480 ml     Physical Exam  Awake Alert, No new F.N deficits, Normal affect [place].AT,PERRAL Supple Neck,No JVD, No cervical lymphadenopathy appriciated.  Symmetrical Chest wall movement, Good air movement bilaterally, CTAB RRR,No Gallops, Rubs or new Murmurs, No Parasternal Heave +ve B.Sounds, Abd Soft, No tenderness, No organomegaly appriciated, No rebound - guarding or rigidity. No Cyanosis, Clubbing or edema, No new Rash or bruise    Data Review:    Recent Labs  Lab 01/29/20 0321 01/29/20 0430 01/29/20 0639 01/30/20 0351 01/31/20 0320  WBC 11.5*  --   --  9.1 9.2  HGB 10.7* 12.2 12.6 10.5* 9.8*  HCT 35.0* 36.0 37.0 33.9* 30.9*  PLT 272  --   --  241 219  MCV 79.4*  --   --  78.7* 78.2*  MCH 24.3*  --   --  24.4* 24.8*  MCHC 30.6  --   --  31.0 31.7  RDW 22.9*  --   --  23.0* 23.0*  LYMPHSABS 2.7  --   --   --  2.2  MONOABS 0.6  --   --   --  0.6  EOSABS 0.4  --   --   --  0.4  BASOSABS 0.1  --   --   --  0.0    Recent Labs  Lab 01/29/20 0321 01/29/20 0400 01/29/20 0430 01/29/20 0518 01/29/20 0639 01/30/20 0351 01/30/20 1106 01/31/20 0320  NA 140  --  142  --  142 138  --  141  K 2.7*  --  2.6*  --  3.0* 3.5  --  3.5  CL 100  --   --   --   --  103  --  109  CO2 23  --   --   --   --  22  --  20*  GLUCOSE 186*  --   --   --   --  343*  --  194*  BUN 15  --   --   --   --  19  --  11  CREATININE 1.91*  --   --   --   --  1.54*  --  1.23*  CALCIUM 9.0  --   --   --   --  8.8*  --  8.8*  AST 36  --   --   --   --   --   --   --   ALT 12  --   --   --   --   --   --   --  ALKPHOS 67  --   --   --   --   --   --   --   BILITOT 0.7  --   --   --   --   --   --   --   ALBUMIN 3.2*  --   --   --   --    --   --   --   MG  --   --   --  1.6*  --   --  1.6* 1.7  HGBA1C  --   --   --   --   --   --  9.5*  --   AMMONIA  --  24  --   --   --   --   --   --   BNP  --   --   --   --   --   --   --  105.9*    Recent Labs  Lab 01/29/20 0546 01/31/20 0320  BNP  --  105.9*  SARSCOV2NAA NEGATIVE  --     ------------------------------------------------------------------------------------------------------------------ No results for input(s): CHOL, HDL, LDLCALC, TRIG, CHOLHDL, LDLDIRECT in the last 72 hours.  Lab Results  Component Value Date   HGBA1C 9.5 (H) 01/30/2020   ------------------------------------------------------------------------------------------------------------------ No results for input(s): TSH, T4TOTAL, T3FREE, THYROIDAB in the last 72 hours.  Invalid input(s): FREET3 ------------------------------------------------------------------------------------------------------------------ No results for input(s): VITAMINB12, FOLATE, FERRITIN, TIBC, IRON, RETICCTPCT in the last 72 hours.  Coagulation profile No results for input(s): INR, PROTIME in the last 168 hours.  No results for input(s): DDIMER in the last 72 hours.  Cardiac Enzymes No results for input(s): CKMB, TROPONINI, MYOGLOBIN in the last 168 hours.  Invalid input(s): CK ------------------------------------------------------------------------------------------------------------------    Component Value Date/Time   BNP 105.9 (H) 01/31/2020 0320   BNP 67.5 03/30/2017 1438    Micro Results Recent Results (from the past 240 hour(s))  Respiratory Panel by RT PCR (Flu A&B, Covid) - Nasopharyngeal Swab     Status: None   Collection Time: 01/29/20  5:46 AM   Specimen: Nasopharyngeal Swab  Result Value Ref Range Status   SARS Coronavirus 2 by RT PCR NEGATIVE NEGATIVE Final    Comment: (NOTE) SARS-CoV-2 target nucleic acids are NOT DETECTED. The SARS-CoV-2 RNA is generally detectable in upper  respiratoy specimens during the acute phase of infection. The lowest concentration of SARS-CoV-2 viral copies this assay can detect is 131 copies/mL. A negative result does not preclude SARS-Cov-2 infection and should not be used as the sole basis for treatment or other patient management decisions. A negative result may occur with  improper specimen collection/handling, submission of specimen other than nasopharyngeal swab, presence of viral mutation(s) within the areas targeted by this assay, and inadequate number of viral copies (<131 copies/mL). A negative result must be combined with clinical observations, patient history, and epidemiological information. The expected result is Negative. Fact Sheet for Patients:  https://www.moore.com/ Fact Sheet for Healthcare Providers:  https://www.young.biz/ This test is not yet ap proved or cleared by the [place] FDA and  has been authorized for detection and/or diagnosis of SARS-CoV-2 by FDA under an Emergency Use Authorization (EUA). This EUA will remain  in effect (meaning this test can be used) for the duration of the COVID-19 declaration under Section 564(b)(1) of the Act, 21 U.S.C. section 360bbb-3(b)(1), unless the authorization is terminated or revoked sooner.    Influenza A by PCR NEGATIVE NEGATIVE Final   Influenza B by  PCR NEGATIVE NEGATIVE Final    Comment: (NOTE) The Xpert Xpress SARS-CoV-2/FLU/RSV assay is intended as an aid in  the diagnosis of influenza from Nasopharyngeal swab specimens and  should not be used as a sole basis for treatment. Nasal washings and  aspirates are unacceptable for Xpert Xpress SARS-CoV-2/FLU/RSV  testing. Fact Sheet for Patients: https://www.moore.com/ Fact Sheet for Healthcare Providers: https://www.young.biz/ This test is not yet approved or cleared by the [place] FDA and  has been authorized for  detection and/or diagnosis of SARS-CoV-2 by  FDA under an Emergency Use Authorization (EUA). This EUA will remain  in effect (meaning this test can be used) for the duration of the  Covid-19 declaration under Section 564(b)(1) of the Act, 21  U.S.C. section 360bbb-3(b)(1), unless the authorization is  terminated or revoked. Performed at [facility], [address], [place], [place] [address]   Blood culture (routine x 2)     Status: None (Preliminary result)   Collection Time: 01/29/20  8:15 AM   Specimen: BLOOD LEFT HAND  Result Value Ref Range Status   Specimen Description BLOOD LEFT HAND  Final   Special Requests   Final    BOTTLES DRAWN AEROBIC AND ANAEROBIC Blood Culture adequate volume   Culture   Final    NO GROWTH 2 DAYS Performed at [facility], [address], [place], [place] [address]    Report Status PENDING  Incomplete  Blood culture (routine x 2)     Status: None (Preliminary result)   Collection Time: 01/29/20  8:20 AM   Specimen: BLOOD LEFT ARM  Result Value Ref Range Status   Specimen Description BLOOD LEFT ARM  Final   Special Requests   Final    BOTTLES DRAWN AEROBIC AND ANAEROBIC Blood Culture adequate volume   Culture   Final    NO GROWTH 2 DAYS Performed at [facility], [address], [place], [place] [address]    Report Status PENDING  Incomplete    Radiology Reports DG Abdomen 1 View  Result Date: 01/29/2020 CLINICAL DATA:  Sickle cell.  Altered mental status EXAM: ABDOMEN - 1 VIEW COMPARISON:  None. FINDINGS: The nonspecific bowel gas pattern without evidence of obstruction. Air is seen within large and small bowel loops throughout the abdomen. No radio-opaque calculi or other significant radiographic abnormality are seen. No acute osseous findings. IMPRESSION: Nonspecific bowel gas pattern. Electronically Signed   By: Duanne Guess D.O.   On: 01/29/2020 13:34   CT Head Wo Contrast  Result Date: 01/29/2020 CLINICAL DATA:  .  Found unresponsive. Recent fall. EXAM: CT HEAD WITHOUT CONTRAST CT CERVICAL SPINE WITHOUT CONTRAST TECHNIQUE: Multidetector CT imaging of the head and cervical spine was performed following the standard protocol without intravenous contrast. Multiplanar CT image reconstructions of the cervical spine were also generated. COMPARISON:  None. FINDINGS: CT HEAD FINDINGS Brain: There is no mass, hemorrhage or extra-axial collection. The size and configuration of the ventricles and extra-axial CSF spaces are normal. The brain parenchyma is normal, without evidence of acute or chronic infarction. Vascular: No abnormal hyperdensity of the major intracranial arteries or dural venous sinuses. No intracranial atherosclerosis. Skull: The visualized skull base, calvarium and extracranial soft tissues are normal. Sinuses/Orbits: No fluid levels or advanced mucosal thickening of the visualized paranasal sinuses. No mastoid or middle ear effusion. The orbits are normal. CT CERVICAL SPINE FINDINGS Alignment: No static subluxation. Facets are aligned. Occipital condyles are normally positioned. Skull base and  vertebrae: No acute fracture. Soft tissues and spinal canal: No prevertebral fluid or swelling. No visible canal hematoma. Disc levels: No advanced spinal canal or neural foraminal stenosis. Upper chest: No pneumothorax, pulmonary nodule or pleural effusion. Other: Normal visualized paraspinal cervical soft tissues. IMPRESSION: 1. No acute intracranial abnormality. 2. No acute fracture or static subluxation of the cervical spine. Electronically Signed   By: Ulyses Jarred M.D.   On: 01/29/2020 03:58   CT Cervical Spine Wo Contrast  Result Date: 01/29/2020 CLINICAL DATA:  . Found unresponsive. Recent fall. EXAM: CT HEAD WITHOUT CONTRAST CT CERVICAL SPINE WITHOUT CONTRAST TECHNIQUE: Multidetector CT imaging of the head and cervical spine was performed following the standard protocol without intravenous contrast. Multiplanar CT  image reconstructions of the cervical spine were also generated. COMPARISON:  None. FINDINGS: CT HEAD FINDINGS Brain: There is no mass, hemorrhage or extra-axial collection. The size and configuration of the ventricles and extra-axial CSF spaces are normal. The brain parenchyma is normal, without evidence of acute or chronic infarction. Vascular: No abnormal hyperdensity of the major intracranial arteries or dural venous sinuses. No intracranial atherosclerosis. Skull: The visualized skull base, calvarium and extracranial soft tissues are normal. Sinuses/Orbits: No fluid levels or advanced mucosal thickening of the visualized paranasal sinuses. No mastoid or middle ear effusion. The orbits are normal. CT CERVICAL SPINE FINDINGS Alignment: No static subluxation. Facets are aligned. Occipital condyles are normally positioned. Skull base and vertebrae: No acute fracture. Soft tissues and spinal canal: No prevertebral fluid or swelling. No visible canal hematoma. Disc levels: No advanced spinal canal or neural foraminal stenosis. Upper chest: No pneumothorax, pulmonary nodule or pleural effusion. Other: Normal visualized paraspinal cervical soft tissues. IMPRESSION: 1. No acute intracranial abnormality. 2. No acute fracture or static subluxation of the cervical spine. Electronically Signed   By: Ulyses Jarred M.D.   On: 01/29/2020 03:58   MR BRAIN WO CONTRAST  Result Date: 01/30/2020 CLINICAL DATA:  Altered mental status, unclear cause. EXAM: MRI HEAD WITHOUT CONTRAST TECHNIQUE: Multiplanar, multiecho pulse sequences of the brain and surrounding structures were obtained without intravenous contrast. COMPARISON:  Head CT 01/29/2020, brain MRI 02/14/2019 FINDINGS: Brain: There is no evidence of acute infarct. No evidence of intracranial mass. No midline shift or extra-axial fluid collection. No chronic intracranial blood products. No focal parenchymal signal abnormality is demonstrated. Cerebral volume is normal for  age. Vascular: Flow voids maintained within the proximal large arterial vessels. Skull and upper cervical spine: No focal marrow lesion. T1 hypointense marrow signal within the visualized upper cervical spine. Sinuses/Orbits: Visualized orbits demonstrate no acute abnormality. Mild paranasal sinus mucosal thickening greatest within bilateral ethmoid air cells and within the left sphenoid sinus. No significant mastoid effusion. IMPRESSION: Unremarkable non-contrast MRI appearance of the brain. No evidence of acute intracranial abnormality. Mild paranasal sinus mucosal thickening. T1 hypointense marrow signal within the visualized upper cervical spine. While this finding may be seen in the setting of a marrow infiltrative process, the most common causes include chronic anemia, smoking and obesity. Electronically Signed   By: Kellie Simmering DO   On: 01/30/2020 18:56   DG Chest Portable 1 View  Result Date: 01/29/2020 CLINICAL DATA:  Altered mental status EXAM: PORTABLE CHEST 1 VIEW COMPARISON:  02/07/2019 FINDINGS: Cardiomegaly accentuated by portable technique. Minimal streaky density at the right base, likely atelectasis. Negative aortic and hilar contours for technique. No effusion or pneumothorax. IMPRESSION: Cardiomegaly without failure. Probable mild atelectasis on the right. Electronically Signed   By: Angelica Chessman  Watts M.D.   On: 01/29/2020 04:46   EEG adult  Result Date: 01/30/2020 Charlsie QuestYadav, Priyanka O, MD     01/30/2020 11:16 AM Patient Name: Dierdre HighmanJulie M Goens MRN: [id number] Epilepsy Attending: Charlsie QuestPriyanka O Yadav Referring Physician/Provider: Dr. Susa RaringPrashant Ivannia Willhelm Date: 01/30/2020 Duration: 23.51 mins Patient history: 44 year old female with history of diabetes, polysubstance abuse, seizures presented with altered mental status.  EEG evaluate for seizures. Level of alertness: Awake AEDs during EEG study: None Technical aspects: This EEG study was done with scalp electrodes positioned according to the 10-20  International system of electrode placement. Electrical activity was acquired at a sampling rate of 500Hz  and reviewed with a high frequency filter of 70Hz  and a low frequency filter of 1Hz . EEG data were recorded continuously and digitally stored. Description: Although difficult to appreciate due to significant electrode artifact throughout the EEG, the posterior dominant rhythm of 9 Hz activity of moderate voltage (25-35 uV) seen predominantly in posterior head regions, symmetric was noted.  Hyperventilation and photic stimulation were not performed. Of note, EEG was technically difficult due to eyelid flutter artifact throughout the study. IMPRESSION: This technically difficult study is within normal limits. No seizures or epileptiform discharges were seen throughout the recording. Priyanka Annabelle Harman Yadav    Time Spent in minutes  30   Susa RaringPrashant Lynna Zamorano M.D on 01/31/2020 at 11:40 AM  To page go to [email or web address] - password [email or web address]

## 2020-01-31 NOTE — Progress Notes (Signed)
Physical Therapy Treatment Patient Details Name: Jill Shaw MRN: [id number] DOB: [birth year] Today's Date: 01/31/2020    History of Present Illness Pt is a 44 y/o female admitted secondary to AMS and hypotension. Found to have elevated ethanol level. CT negative for acute abnormality. PMH includes COPD, DM, CKD, obesity, HTN, seizures, and sickle cell trait.     PT Comments    Patient received sitting edge of bed with RN present in room. Patient reports she is not feeling well, reports vomiting since she has been here. Shaking while seated on side of bed. Patient able to take a few steps up along the side of bed, but requested to get back into the bed. Patient reports she is walking to the bathroom. Patient will continue to benefit from skilled PT while here to improve strength and functional independence prior to returning home.      Follow Up Recommendations  Other (comment);Supervision for mobility/OOB     Equipment Recommendations  Other (comment)(TBD)    Recommendations for Other Services       Precautions / Restrictions Precautions Precautions: Fall Restrictions Weight Bearing Restrictions: No    Mobility  Bed Mobility Overal bed mobility: Modified Independent             General bed mobility comments: Patient received sitting up on side of bed with RN present in room. Patient returned to bed ( supine) indepenently.  Transfers Overall transfer level: Needs assistance Equipment used: None Transfers: Sit to/from Stand Sit to Stand: Supervision            Ambulation/Gait Ambulation/Gait assistance: Min guard Gait Distance (Feet): 3 Feet Assistive device: None Gait Pattern/deviations: Step-to pattern     General Gait Details: patient able to take a few steps along side of bed, not feeling well reports vomiting, therefore mobility limited.   Stairs             Wheelchair Mobility    Modified Rankin (Stroke Patients Only)       Balance  Overall balance assessment: Mild deficits observed, not formally tested Sitting-balance support: Feet supported Sitting balance-Leahy Scale: Good     Standing balance support: No upper extremity supported Standing balance-Leahy Scale: Fair Standing balance comment: patient generally steady with standing and walking a few feet.                            Cognition Arousal/Alertness: Awake/alert Behavior During Therapy: Flat affect Overall Cognitive Status: Within Functional Limits for tasks assessed                                        Exercises      General Comments General comments (skin integrity, edema, etc.): Patient reports she is walking to bathroom with assist.      Pertinent Vitals/Pain Pain Assessment: No/denies pain    Home Living                      Prior Function            PT Goals (current goals can now be found in the care plan section) Acute Rehab PT Goals Patient Stated Goal: to feel better PT Goal Formulation: With patient Time For Goal Achievement: 02/12/20 Potential to Achieve Goals: Good Progress towards PT goals: Progressing toward goals    Frequency  Min 3X/week      PT Plan Current plan remains appropriate    Co-evaluation              AM-PAC PT "6 Clicks" Mobility   Outcome Measure  Help needed turning from your back to your side while in a flat bed without using bedrails?: None Help needed moving from lying on your back to sitting on the side of a flat bed without using bedrails?: None Help needed moving to and from a bed to a chair (including a wheelchair)?: A Little Help needed standing up from a chair using your arms (e.g., wheelchair or bedside chair)?: A Little Help needed to walk in hospital room?: A Little Help needed climbing 3-5 steps with a railing? : A Lot 6 Click Score: 19    End of Session   Activity Tolerance: Other (comment)(patient limited by reports of not  feeling well. Shaking) Patient left: in bed;with bed alarm set;with call bell/phone within reach Nurse Communication: Mobility status PT Visit Diagnosis: Unsteadiness on feet (R26.81);Muscle weakness (generalized) (M62.81);Difficulty in walking, not elsewhere classified (R26.2)     Time: 0354-6568 PT Time Calculation (min) (ACUTE ONLY): 10 min  Charges:  $Gait Training: 8-22 mins                     [employer], PT, GCS 01/31/20,9:30 AM

## 2020-01-31 NOTE — Progress Notes (Signed)
OT Cancellation Note  Patient Details Name: Jill Shaw MRN: [id number] DOB: [birth year]   Cancelled Treatment:    Reason Eval/Treat Not Completed: Medical issues which prohibited therapy(Pt continues to vomit and dry heave limiting participation in therapy this date.) OT will return as available and appropriate to initiate OT POC.  Dalphine Handing, MSOT, OTR/L Acute Rehabilitation Services [facility] Office Number: [phone] Pager: [phone]  Dalphine Handing 01/31/2020, 3:48 PM

## 2020-02-01 DIAGNOSIS — R1010 Upper abdominal pain, unspecified: Secondary | ICD-10-CM

## 2020-02-01 DIAGNOSIS — R1084 Generalized abdominal pain: Secondary | ICD-10-CM

## 2020-02-01 LAB — BASIC METABOLIC PANEL
Anion gap: 12 (ref 5–15)
BUN: 9 mg/dL (ref 6–20)
CO2: 21 mmol/L — ABNORMAL LOW (ref 22–32)
Calcium: 9.2 mg/dL (ref 8.9–10.3)
Chloride: 107 mmol/L (ref 98–111)
Creatinine, Ser: 1.09 mg/dL — ABNORMAL HIGH (ref 0.44–1.00)
GFR calc Af Amer: >60 mL/min (ref >60)
GFR calc non Af Amer: >60 mL/min (ref >60)
Glucose, Bld: 181 mg/dL — ABNORMAL HIGH (ref 70–99)
Potassium: 3.9 mmol/L (ref 3.5–5.1)
Sodium: 140 mmol/L (ref 135–145)

## 2020-02-01 LAB — CBC WITH DIFFERENTIAL/PLATELET
Abs Immature Granulocytes: 0.13 10*3/uL — ABNORMAL HIGH (ref 0.00–0.07)
Basophils Absolute: 0 10*3/uL (ref 0.0–0.1)
Basophils Relative: 0 %
Eosinophils Absolute: 0 10*3/uL (ref 0.0–0.5)
Eosinophils Relative: 0 %
HCT: 32.2 % — ABNORMAL LOW (ref 36.0–46.0)
Hemoglobin: 10.1 g/dL — ABNORMAL LOW (ref 12.0–15.0)
Immature Granulocytes: 1 %
Lymphocytes Relative: 20 %
Lymphs Abs: 2.4 10*3/uL (ref 0.7–4.0)
MCH: 25 pg — ABNORMAL LOW (ref 26.0–34.0)
MCHC: 31.4 g/dL (ref 30.0–36.0)
MCV: 79.7 fL — ABNORMAL LOW (ref 80.0–100.0)
Monocytes Absolute: 0.6 10*3/uL (ref 0.1–1.0)
Monocytes Relative: 5 %
Neutro Abs: 8.8 10*3/uL — ABNORMAL HIGH (ref 1.7–7.7)
Neutrophils Relative %: 74 %
Platelets: 255 10*3/uL (ref 150–400)
RBC: 4.04 MIL/uL (ref 3.87–5.11)
RDW: 23.6 % — ABNORMAL HIGH (ref 11.5–15.5)
WBC: 11.9 10*3/uL — ABNORMAL HIGH (ref 4.0–10.5)
nRBC: 0.2 % (ref 0.0–0.2)

## 2020-02-01 LAB — GLUCOSE, CAPILLARY
Glucose-Capillary: 215 mg/dL — ABNORMAL HIGH (ref 70–99)
Glucose-Capillary: 204 mg/dL — ABNORMAL HIGH (ref 70–99)
Glucose-Capillary: 180 mg/dL — ABNORMAL HIGH (ref 70–99)
Glucose-Capillary: 178 mg/dL — ABNORMAL HIGH (ref 70–99)
Glucose-Capillary: 144 mg/dL — ABNORMAL HIGH (ref 70–99)

## 2020-02-01 LAB — MAGNESIUM: Magnesium: 1.6 mg/dL — ABNORMAL LOW (ref 1.7–2.4)

## 2020-02-01 LAB — BRAIN NATRIURETIC PEPTIDE: B Natriuretic Peptide: 348.3 pg/mL — ABNORMAL HIGH (ref 0.0–100.0)

## 2020-02-01 MED ORDER — TIZANIDINE HCL 4 MG PO TABS
4.0000 mg | ORAL_TABLET | Freq: Three times a day (TID) | ORAL | Status: DC
  Administered 2020-02-01 – 2020-02-03 (×8): 4 mg via ORAL
  Filled 2020-02-01 (×8): qty 1

## 2020-02-01 MED ORDER — INSULIN ASPART PROT & ASPART (70-30 MIX) 100 UNIT/ML ~~LOC~~ SUSP
45.0000 [IU] | Freq: Two times a day (BID) | SUBCUTANEOUS | Status: DC
  Administered 2020-02-01 – 2020-02-02 (×2): 45 [IU] via SUBCUTANEOUS
  Filled 2020-02-01: qty 10

## 2020-02-01 MED ORDER — LACTATED RINGERS IV SOLN: INTRAVENOUS | Status: DC

## 2020-02-01 MED ORDER — SODIUM CHLORIDE 0.9 % IV SOLN
1.0000 g | INTRAVENOUS | Status: AC
  Administered 2020-02-01 – 2020-02-03 (×3): 1 g via INTRAVENOUS
  Filled 2020-02-01 (×3): qty 1

## 2020-02-01 MED ORDER — MAGNESIUM SULFATE 2 GM/50ML IV SOLN
2.0000 g | Freq: Once | INTRAVENOUS | Status: AC
  Administered 2020-02-01: 2 g via INTRAVENOUS
  Filled 2020-02-01: qty 50

## 2020-02-01 NOTE — Consult Note (Signed)
Obstetrics & Gynecology Consult Note  Date of Consult: 02/01/2020   Requesting Provider: Dr. Lala Lund  Primary OBGYN: [facility] Primary Care Provider: Vevelyn Francois  Reason for Consult: abdominal pain  History of Present Illness: Jill Shaw is a 44 y.o. G5P5 (No LMP recorded. (Menstrual status: Irregular Periods).), with the above CC. PMHx is significant for 08/20/2019 Minerva endometrial ablation by Dr. Hulan Fray and inpatient admission from 3/5 to 3/6 for AUB, anemia and need for blood transfusion. She was last seen in the office on 3/10 where she was doing well with the plan to finish out her Lysteda course and to continue on the bid Megace and in about a month; she missed her 3/31 visit with GYN.  She was admitted by the Hospitalist service on 4/15 after being brought to the ED for syncope and AMS work up.  She states that she started having abdominal pain about a day or two ago. She states that it's diffuse and she is not having any VB, vaginal discharge or itching. She states that she can't remember the last time she had VB and she was continuing on the megace prior to being admitted.  A CT A/P was done yesterday and it was negative for any GYN issues (see below).   ROS: A 12-point review of systems was performed and negative, except as stated in the above HPI.  OBGYN History: As per HPI. OB History  Gravida Para Term Preterm AB Living  5         3  SAB TAB Ectopic Multiple Live Births               # Outcome Date GA Lbr Len/2nd Weight Sex Delivery Anes PTL Lv  5 Gravida           4 Gravida           3 Gravida           2 Gravida           1 [place]            Past Medical History: Past Medical History:  Diagnosis Date  . Anemia   . Arthritis    knees, hands  . Asthma   . COPD (chronic obstructive pulmonary disease) ([place])   . Diabetes mellitus without complication ([place])    type 2  . Dysfunctional uterine bleeding   . GERD (gastroesophageal  reflux disease)   . Hypertension   . Neuromuscular disorder (H[facility])    neuropathy feet  . Seizures ([place]) 09/12/2017   pt states r/t stress and blood sugar - no meds last one 4 months ago, not seen neurologist  . Sickle cell trait ([place])   . Smoker   . Vitamin D deficiency 10/2019  . Wears glasses     Past Surgical History: Past Surgical History:  Procedure Laterality Date  . CESAREAN SECTION     x 1  . DILATION AND CURETTAGE OF UTERUS N/A 08/20/2019   Procedure: DILATATION AND CURETTAGE;  Surgeon: Emily Filbert, MD;  Location: [place];  Service: Gynecology;  Laterality: N/A;  . ENDOMETRIAL ABLATION N/A 08/20/2019   Procedure: Minerva Ablation;  Surgeon: Emily Filbert, MD;  Location: [place];  Service: Gynecology;  Laterality: N/A;  . EYE SURGERY Bilateral    laser right and cataract removed left eye  . RADIOLOGY WITH ANESTHESIA N/A 09/16/2019   Procedure: MRI WITH ANESTHESIA   L SPINE WITHOUT CONTRAST, T SPINE WITHOUT CONTRAST ,  CERVICAL WITHOUT CONTRAST;  Surgeon: Radiologist, Medication, MD;  Location: [place];  Service: Radiology;  Laterality: N/A;  . TUBAL LIGATION    . UPPER GI ENDOSCOPY  07/2017    Family History:  Family History  Problem Relation Age of Onset  . Diabetes Mother   . Hypertension Mother    Social History:  Social History   Socioeconomic History  . Marital status: Legally Separated    Spouse name: Not on file  . Number of children: Not on file  . Years of education: Not on file  . Highest education level: Not on file  Occupational History  . Occupation: unemployed  Tobacco Use  . Smoking status: Current Every Day Smoker    Packs/day: 0.50    Years: 26.00    Pack years: 13.00    Types: Cigarettes  . Smokeless tobacco: Never Used  Substance and Sexual Activity  . Alcohol use: No  . Drug use: No  . Sexual activity: Not Currently    Birth control/protection: None  Other Topics Concern  . Not on file  Social History Narrative  . Not on file   Social  Determinants of Health   Financial Resource Strain:   . Difficulty of Paying Living Expenses:   Food Insecurity:   . Worried About Charity fundraiser in the Last Year:   . Arboriculturist in the Last Year:   Transportation Needs:   . Film/video editor (Medical):   Marland Kitchen Lack of Transportation (Non-Medical):   Physical Activity:   . Days of Exercise per Week:   . Minutes of Exercise per Session:   Stress:   . Feeling of Stress :   Social Connections:   . Frequency of Communication with Friends and Family:   . Frequency of Social Gatherings with Friends and Family:   . Attends Religious Services:   . Active Member of Clubs or Organizations:   . Attends Archivist Meetings:   Marland Kitchen Marital Status:   Intimate Partner Violence:   . Fear of Current or Ex-Partner:   . Emotionally Abused:   Marland Kitchen Physically Abused:   . Sexually Abused:      Allergy: Allergies  Allergen Reactions  . Ketoprofen Nausea And Vomiting  . Aspirin Nausea Only  . Gabapentin Nausea And Vomiting    upset stomach  . Ibuprofen Nausea And Vomiting  . Liraglutide Nausea And Vomiting  . Naproxen Nausea And Vomiting  . Omeprazole-Sodium Bicarbonate Nausea And Vomiting  . Sulfa Antibiotics Nausea And Vomiting  . Tramadol Nausea And Vomiting    stomach upset    Current Outpatient Medications: Medications Prior to Admission  Medication Sig Dispense Refill Last Dose  . albuterol (PROVENTIL) (2.5 MG/3ML) 0.083% nebulizer solution Take 3 mLs (2.5 mg total) by nebulization every 6 (six) hours as needed for wheezing or shortness of breath. 150 mL 3 01/28/2020 at Unknown time  . albuterol (VENTOLIN HFA) 108 (90 Base) MCG/ACT inhaler Inhale 2 puffs into the lungs every 6 (six) hours as needed for wheezing or shortness of breath. 8 g 3 01/28/2020 at Unknown time  . budesonide-formoterol (SYMBICORT) 160-4.5 MCG/ACT inhaler INHALE 2 PUFFS INTO THE LUNGS 2 (TWO) TIMES DAILY. (Patient taking differently: Inhale 2 puffs  into the lungs 2 (two) times daily. ) 10.2 g 3 01/28/2020 at Unknown time  . cetirizine (ZYRTEC) 10 MG tablet Take 1 tablet (10 mg total) by mouth daily. 30 tablet 3 01/28/2020 at Unknown time  . Cholecalciferol (VITAMIN  D-3) 125 MCG (5000 UT) TABS Take 1 tablet by mouth daily. (Patient taking differently: Take 5,000 Units by mouth daily. ) 90 tablet 3 01/28/2020 at Unknown time  . ferrous sulfate 325 (65 FE) MG tablet Take 1 tablet (325 mg total) by mouth 3 (three) times daily with meals. 30 tablet 3 01/28/2020 at Unknown time  . fluticasone (FLONASE) 50 MCG/ACT nasal spray Place 2 sprays into both nostrils daily. 16 g 6 01/28/2020 at Unknown time  . furosemide (LASIX) 80 MG tablet Take 1 tablet (80 mg total) by mouth daily. 30 tablet 3 01/28/2020 at Unknown time  . Glucosamine Sulfate 1000 MG CAPS Take 1 capsule (1,000 mg total) by mouth 2 (two) times daily. 180 capsule 3 01/28/2020 at Unknown time  . HYDROcodone-acetaminophen (NORCO) 7.5-325 MG tablet Take 1 tablet by mouth in the morning and at bedtime.   01/28/2020 at Unknown time  . hydrocortisone 2.5 % cream Apply topically 2 (two) times daily. 30 g 2 01/28/2020 at Unknown time  . Insulin Lispro Prot & Lispro (HUMALOG MIX 75/25 KWIKPEN) (75-25) 100 UNIT/ML Kwikpen INJECT 100 UNITS EVERY 12 HOURS (Patient taking differently: Inject 100 Units into the skin every 12 (twelve) hours. ) 15 mL 11 01/28/2020 at Unknown time  . Lido-Capsaicin-Men-Methyl Sal 0.5-0.035-5-20 % PTCH Apply 1 patch topically daily. 30 patch 2 01/28/2020 at Unknown time  . Lidocaine-Capsaicin 4-0.1 % CREA Apply 1 application topically 4 (four) times daily as needed. 30 g 2 01/28/2020 at Unknown time  . losartan (COZAAR) 100 MG tablet Take 1 tablet (100 mg total) by mouth daily. 30 tablet 3 01/28/2020 at Unknown time  . metoprolol succinate (TOPROL-XL) 100 MG 24 hr tablet Take 1 tablet (100 mg total) by mouth daily. Take with or immediately following a meal. 90 tablet 3 01/28/2020 at 1300  .  norethindrone (AYGESTIN) 5 MG tablet Take 2 tablets (10 mg total) by mouth every 8 (eight) hours. 90 tablet 1 01/28/2020 at Unknown time  . omeprazole (PRILOSEC) 40 MG capsule Take 1 capsule (40 mg total) by mouth daily. 30 capsule 3 01/28/2020 at Unknown time  . potassium chloride SA (KLOR-CON) 20 MEQ tablet Take 1 tablet (20 mEq total) by mouth daily. 30 tablet 3 01/28/2020 at Unknown time  . saxagliptin HCl (ONGLYZA) 2.5 MG TABS tablet Take 1 tablet (2.5 mg total) by mouth daily. 90 tablet 3 01/28/2020 at Unknown time  . tizanidine (ZANAFLEX) 6 MG capsule Take 1 capsule (6 mg total) by mouth 3 (three) times daily. 270 capsule 0 01/28/2020 at Unknown time  . Turmeric 500 MG CAPS Take 500 mg by mouth 2 (two) times daily. 180 capsule 3 01/28/2020 at Unknown time  . Vitamin D, Ergocalciferol, (DRISDOL) 1.25 MG (50000 UNIT) CAPS capsule Take 1 capsule (50,000 Units total) by mouth every 7 (seven) days. (Patient taking differently: Take 50,000 Units by mouth every 7 (seven) days. Fridays) 5 capsule 6 Past Week at Unknown time  . Blood Glucose Monitoring Suppl (TRUE METRIX METER) w/Device KIT 1 each by Does not apply route 4 (four) times daily -  before meals and at bedtime. 1 kit 0   . glucose blood (TRUE METRIX BLOOD GLUCOSE TEST) test strip Use as instructed 100 each 12   . Insulin Pen Needle (PEN NEEDLES) 30G X 5 MM MISC 1 Units by Does not apply route as directed. 100 each [facility] Medications: Current Facility-Administered Medications  Medication Dose Route Frequency Provider Last Rate Last Admin  .  acetaminophen (TYLENOL) tablet 650 mg  650 mg Oral Q6H PRN Karmen Bongo, MD   650 mg at 01/31/20 0636  . albuterol (PROVENTIL) (2.5 MG/3ML) 0.083% nebulizer solution 2.5 mg  2.5 mg Nebulization Q2H PRN Karmen Bongo, MD      . amLODipine (NORVASC) tablet 10 mg  10 mg Oral Daily Thurnell Lose, MD   10 mg at 02/01/20 0828  . bisacodyl (DULCOLAX) EC tablet 5 mg  5 mg Oral Daily PRN Karmen Bongo, MD      . cefTRIAXone (ROCEPHIN) 1 g in sodium chloride 0.9 % 100 mL IVPB  1 g Intravenous Q24H Thurnell Lose, MD 200 mL/hr at 02/01/20 0937 1 g at 02/01/20 0937  . docusate sodium (COLACE) capsule 100 mg  100 mg Oral BID Karmen Bongo, MD   100 mg at 02/01/20 0827  . enoxaparin (LOVENOX) injection 40 mg  40 mg Subcutaneous Q24H Karmen Bongo, MD   40 mg at 01/31/20 1500  . fluticasone (FLONASE) 50 MCG/ACT nasal spray 2 spray  2 spray Each Nare Daily Karmen Bongo, MD   2 spray at 02/01/20 0831  . hydrALAZINE (APRESOLINE) injection 5 mg  5 mg Intravenous Q4H PRN Karmen Bongo, MD   5 mg at 01/30/20 0450  . hydrALAZINE (APRESOLINE) tablet 100 mg  100 mg Oral Q8H Thurnell Lose, MD   100 mg at 02/01/20 0457  . insulin aspart (novoLOG) injection 0-20 Units  0-20 Units Subcutaneous TID WC Karmen Bongo, MD   7 Units at 02/01/20 [phone]  . insulin aspart (novoLOG) injection 0-5 Units  0-5 Units Subcutaneous QHS Karmen Bongo, MD   2 Units at 01/29/20 2146  . insulin aspart protamine- aspart (NOVOLOG MIX 70/30) injection 45 Units  45 Units Subcutaneous BID WC Thurnell Lose, MD      . lactated ringers infusion   Intravenous Continuous Thurnell Lose, MD 75 mL/hr at 02/01/20 0905 New Bag at 02/01/20 0905  . loratadine (CLARITIN) tablet 10 mg  10 mg Oral Daily Karmen Bongo, MD   10 mg at 02/01/20 9924  . melatonin tablet 3 mg  3 mg Oral QHS Blount, Xenia T, NP   3 mg at 01/31/20 2220  . mometasone-formoterol (DULERA) 200-5 MCG/ACT inhaler 2 puff  2 puff Inhalation BID Karmen Bongo, MD   2 puff at 02/01/20 0818  . nicotine (NICODERM CQ - dosed in mg/24 hours) patch 14 mg  14 mg Transdermal Daily Karmen Bongo, MD   14 mg at 01/30/20 0939  . norethindrone (AYGESTIN) tablet 10 mg  10 mg Oral Q8H Karmen Bongo, MD   10 mg at 01/31/20 0636  . ondansetron (ZOFRAN) injection 4 mg  4 mg Intravenous Q6H PRN Karmen Bongo, MD   4 mg at 02/01/20 0905  . pantoprazole  (PROTONIX) EC tablet 40 mg  40 mg Oral Daily Karmen Bongo, MD   40 mg at 02/01/20 2683  . polyethylene glycol (MIRALAX / GLYCOLAX) packet 17 g  17 g Oral Daily PRN Karmen Bongo, MD      . promethazine (PHENERGAN) injection 25 mg  25 mg Intravenous Q8H PRN Thurnell Lose, MD   25 mg at 02/01/20 0021  . sodium chloride flush (NS) 0.9 % injection 3 mL  3 mL Intravenous Q12H Karmen Bongo, MD   3 mL at 01/31/20 1851  . tiZANidine (ZANAFLEX) tablet 4 mg  4 mg Oral TID Thurnell Lose, MD   4 mg at 02/01/20 0905  . traMADol (  ULTRAM) tablet 50 mg  50 mg Oral Q6H PRN Thurnell Lose, MD   50 mg at 02/01/20 0153     Physical Exam:   Current Vital Signs 24h Vital Sign Ranges  T 98.7 F (37.1 C) Temp  Avg: 98.7 F (37.1 C)  Min: 98.4 F (36.9 C)  Max: 99.4 F (37.4 C)  BP (!) 144/61 BP  Min: 120/42  Max: 164/62  HR 85 Pulse  Avg: 102.7  Min: 85  Max: 119  RR 20 Resp  Avg: 22.5  Min: 20  Max: 30  SaO2 95 % Room Air SpO2  Avg: 96.4 %  Min: 94 %  Max: 100 %       24 Hour I/O Current Shift I/O  Time Ins Outs 04/17 0701 - 04/18 0700 In: 4496 [P.O.:480; I.V.:848] Out: -  No intake/output data recorded.   Patient Vitals for the past 24 hrs:  BP Temp Temp src Pulse Resp SpO2  02/01/20 0818 -- -- -- -- -- 95 %  02/01/20 0800 (!) 144/61 98.7 F (37.1 C) Oral 85 20 94 %  02/01/20 0500 -- -- -- -- (!) 21 94 %  02/01/20 0330 (!) 164/62 99.4 F (37.4 C) Oral -- (!) 30 --  01/31/20 1952 (!) 120/42 98.4 F (36.9 C) Oral (!) 119 20 100 %  01/31/20 1237 (!) 147/49 -- -- -- 20 --  01/31/20 1126 (!) 160/116 98.4 F (36.9 C) Oral (!) 104 (!) 24 99 %    Body mass index is 46.35 kg/m. General appearance: Well nourished, well developed female, somnolent. Answers questions easily Respiratory:   Normal respiratory effort Abdomen: +BS, obese, soft, ND. Has some ttp in the periumbilical and upper abdominal areas. No lower abdominal tenderness or peritoneal s/s.  When I ask her to point to  where it hurts she points in the periumbilical area.  Back: no CVAT Neuro/Psych:  Normal mood and affect.  Skin:  Warm and dry.   Laboratory: Beta HCG: negative Recent Labs  Lab 01/30/20 0351 01/31/20 0320 02/01/20 0255  WBC 9.1 9.2 11.9*  HGB 10.5* 9.8* 10.1*  HCT 33.9* 30.9* 32.2*  PLT 241 219 255   Recent Labs  Lab 01/29/20 0321 01/29/20 0430 01/30/20 0351 01/31/20 0320 02/01/20 0255  NA 140   < > 138 141 140  K 2.7*   < > 3.5 3.5 3.9  CL 100   < > 103 109 107  CO2 23   < > 22 20* 21*  BUN 15   < > '19 11 9  '$ CREATININE 1.91*   < > 1.54* 1.23* 1.09*  CALCIUM 9.0   < > 8.8* 8.8* 9.2  PROT 6.6  --   --   --   --   BILITOT 0.7  --   --   --   --   ALKPHOS 67  --   --   --   --   ALT 12  --   --   --   --   AST 36  --   --   --   --   GLUCOSE 186*   < > 343* 194* 181*   < > = values in this interval not displayed.   Imaging:  CLINICAL DATA:  Abdominal distension. Nausea and vomiting.  EXAM: CT ABDOMEN AND PELVIS WITH CONTRAST  TECHNIQUE: Multidetector CT imaging of the abdomen and pelvis was performed using the standard protocol following bolus administration of intravenous contrast.  CONTRAST:  127m OMNIPAQUE IOHEXOL 300 MG/ML  SOLN  COMPARISON:  None.  FINDINGS: Lower chest: No acute abnormality.  Hepatobiliary: Scattered hypodense areas within the liver, the most masslike appreciated at the posterior-inferior margin of the RIGHT liver lobe measuring 8.4 cm, most likely atypical geographic fatty infiltration but neoplastic or infectious process cannot be entirely excluded.  Gallbladder appears normal. No bile duct dilatation.  Pancreas: Unremarkable. No pancreatic ductal dilatation or surrounding inflammatory changes.  Spleen: Normal in size without focal abnormality.  Adrenals/Urinary Tract: Adrenal glands appear normal. Kidneys are unremarkable without mass, stone or hydronephrosis. No perinephric fluid. No ureteral or bladder  calculi. Bladder is unremarkable, partially decompressed.  Stomach/Bowel: No dilated large or small bowel loops. No evidence of bowel wall inflammation. Appendix is normal. Stomach is unremarkable, partially decompressed.  Vascular/Lymphatic: No significant vascular findings are present. No enlarged abdominal or pelvic lymph nodes.  Reproductive: Probable uterine fibroids. No adnexal mass or free fluid seen.  Other: No free fluid or abscess collection. No free intraperitoneal air.  Musculoskeletal: No acute or suspicious osseous finding.  IMPRESSION: 1. Scattered hypodense areas within the liver, the most masslike appreciated at the posterior-inferior margin of the RIGHT liver lobe measuring 8.4 cm, most likely atypical geographic fatty infiltration but neoplastic or infectious process cannot be entirely excluded. Recommend nonemergent liver MRI with contrast for further characterization. When the patient is clinically stable and able to follow directions and hold their breath (preferably as an outpatient) further evaluation with dedicated abdominal MRI should be considered. 2. Remainder of the abdomen and pelvis CT is unremarkable. No bowel obstruction or evidence of bowel wall inflammation. No free fluid or abscess collection. No evidence of acute solid organ abnormality. No renal or ureteral calculi. Appendix is normal.   Electronically Signed   By: SFranki CabotM.D.   On: 01/31/2020 17:06  Assessment: Jill Shaw a 44y.o. P5 (No LMP recorded. (Menstrual status: Irregular Periods).) stable with diffuse mid to upper abdominal discomfort.   Plan: -Recommend UCx and urine gonorrhea, chlamydia and trich testing -continue on Aygestin. Can switch her back to megace or keep her on aygestin on discharge -keep her already scheduled GYN follow up for later this week  I told the patient and her mom that I don't feel that the pain is GYN in origin given her CT scan  and what she's describing to me and her physical exam. I told them that I don't feel a pelvic u/s is necessary, either, but, just to be complete, I do recommend a urine culture and urine STI testing  Her mom is concerned about the potential side effects of the Aygestin and the megace. I told her that progestins have theoretical risks, similar to estrogens and the package labeling will describe these but given that she's been on them for close to a month and her pain is new, I don't feel that they contributed to her s/s. I also told her that any potential risks of the progestins are outweighed by keep her from bleeding and hopefully keeping her menstrual hormones and cycle quiescent which means one less issue to worry about.    Total time taking care of the patient was 25 minutes, with greater than 50% of the time spent in face to face interaction with the patient.  CDurene RomansMD [facility]ending [facility] G[facility] Consult Phone: [phone](M-F, [phone]) & [phone](Off hours, weekends, holidays)

## 2020-02-01 NOTE — Progress Notes (Signed)
PROGRESS NOTE                                                                                                                                                                                                             Patient Demographics:    Jill Shaw, is a 44 y.o. female, DOB - 18-Aug-1976, HYW:[id number]  Admit date - 01/29/2020   Admitting Physician Eduard Clos, MD  Outpatient Primary MD for the patient is Barbette Merino, NP  LOS - 2  Chief Complaint  Patient presents with  . Altered Mental Status       Brief Narrative - Jill Shaw is a 44 y.o. female with medical history significant of morbid obesity; tobacco dependence; seizures; HTN; DM; and COPD presenting with AMS.  Her mother said that she passed out, was unconscious last night, apparently she has had a few falls over the last few days, has chronic right lower extremity weakness which he attributes to arthritis and neuropathy.  Currently no headache or new focal weakness, apparently patient was due for outpatient MRI as well.  In the ER her work-up for falls suggested AKI and she was admitted to the hospital.   Subjective:   Patient in bed, appears comfortable, denies any headache, no fever, no chest pain or pressure, no shortness of breath , planes of ongoing nausea and suprapubic abdominal discomfort, passing flatus having bowel movements, no focal weakness.    Assessment  & Plan :     1.  Metabolic encephalopathy associated with multiple falls & AKI - she likely has some element of dehydration along could have been worse due to underlying use of narcotics and muscle relaxers.  Currently no focal deficits, CT head and C-spine unremarkable, EEG unremarkable, MRI non acute.  Pressure has improved after IV fluids for hydration, still wait for PT OT thorough eval, looks like she sat on the side of the bed with PT assistance but I still need full PT evaluation and documentation that she can ambulate  without assistance, have requested the staff to assist today.  2. AKI with hypokalemia.  Due to dehydration resolved after IV fluids, ACE inhibitors and NSAIDs held.  Lactic acid improving after hydration with IV fluids.  No other signs of infection, do not think she is septic.  3.  Chronic pain.  Supportive care.  Minimize narcotics and benzo use.  4.  COPD .  Supportive care no wheezing.  5.  Essential hypertension.  Blood pressure was  low upon admission, Norvasc and monitor.  6.  Morbid obesity with BMI of 46.  Follow with PCP for weight loss.  7.  Smoking.  Counseled to quit.  8.  History of seizures in the remote past.  States this was in the setting of hypoglycemia, question if they were true seizures, EEG unremarkable.  9.  Suprapubic abdominal pain, persistent nausea, unclear etiology -  she is passing flatus and having bowel movements and abdominal exam appears to be benign, CT abdomen pelvis nonacute with some chronic liver findings for which she requires outpatient liver MRI, UA is borderline hence will give her a trial of Rocephin for 3 days starting 02/01/2020, she has had uterine fibroid and endometriosis issues in the past so will request OB to evaluate her as well, continue monitoring with supportive care..   10.  DM type II.  Home dose 75/25 continued + sliding scale, since oral intake is poor due to nausea have cut down 7525 dose  will monitor closely  Lab Results  Component Value Date   HGBA1C 9.5 (H) 01/30/2020   CBG (last 3)  Recent Labs    01/31/20 1616 01/31/20 2053 02/01/20 0619  GLUCAP 164* 166* 215*    Addendum patient continues to complain of ongoing nausea, question if she is withdrawing from either muscle relaxers or narcotics, exam remains benign, will repeat x-ray, bladder scan along with lipase, if x-ray unrevealing BMP check CT scan to rule out any ongoing GI pathology.    Family Communication  : Mother bedside on 01/31/2020, 02/01/20  Code Status  :  Full  Disposition Plan  : Stay in the hospital needs thorough PT OT eval to assess fall risk and ambulatory capacity.  Consults  :  OB  Procedures  :    CT abdomen pelvis.  Kindly look at the report.  Nonspecific liver findings.  Needs dedicated liver MRI in the outpatient setting.    CT head and C-spine.  Non Acute  EEG.  No seizure focus.  MRI brain - no acute findings.  DVT Prophylaxis  :  Lovenox    Lab Results  Component Value Date   PLT 255 02/01/2020    Diet :  Diet Order            DIET SOFT Room service appropriate? Yes; Fluid consistency: Thin  Diet effective now               Inpatient Medications Scheduled Meds: . amLODipine  10 mg Oral Daily  . docusate sodium  100 mg Oral BID  . enoxaparin (LOVENOX) injection  40 mg Subcutaneous Q24H  . fluticasone  2 spray Each Nare Daily  . hydrALAZINE  100 mg Oral Q8H  . insulin aspart  0-20 Units Subcutaneous TID WC  . insulin aspart  0-5 Units Subcutaneous QHS  . insulin aspart protamine- aspart  100 Units Subcutaneous BID WC  . loratadine  10 mg Oral Daily  . melatonin  3 mg Oral QHS  . mometasone-formoterol  2 puff Inhalation BID  . nicotine  14 mg Transdermal Daily  . norethindrone  10 mg Oral Q8H  . pantoprazole  40 mg Oral Daily  . sodium chloride flush  3 mL Intravenous Q12H  . tiZANidine  4 mg Oral TID   Continuous Infusions: . cefTRIAXone (ROCEPHIN)  IV 1 g (02/01/20 0937)  . lactated ringers 75 mL/hr at 02/01/20 0905   PRN Meds:.acetaminophen **OR** [DISCONTINUED] acetaminophen, albuterol, bisacodyl, hydrALAZINE, [DISCONTINUED] ondansetron **OR** ondansetron (  ZOFRAN) IV, polyethylene glycol, promethazine, traMADol  Antibiotics  :   Anti-infectives (From admission, onward)   Start     Dose/Rate Route Frequency Ordered Stop   02/01/20 1000  cefTRIAXone (ROCEPHIN) 1 g in sodium chloride 0.9 % 100 mL IVPB     1 g 200 mL/hr over 30 Minutes Intravenous Every 24 hours 02/01/20 0845 02/04/20 0959    01/29/20 0545  vancomycin (VANCOREADY) IVPB 2000 mg/400 mL     2,000 mg 200 mL/hr over 120 Minutes Intravenous  Once 01/29/20 0535 01/29/20 0856   01/29/20 0530  ceFEPIme (MAXIPIME) 2 g in sodium chloride 0.9 % 100 mL IVPB     2 g 200 mL/hr over 30 Minutes Intravenous  Once 01/29/20 0529 01/29/20 0652   01/29/20 0530  metroNIDAZOLE (FLAGYL) IVPB 500 mg     500 mg 100 mL/hr over 60 Minutes Intravenous  Once 01/29/20 0529 01/29/20 0955   01/29/20 0530  vancomycin (VANCOCIN) IVPB 1000 mg/200 mL premix  Status:  Discontinued     1,000 mg 200 mL/hr over 60 Minutes Intravenous  Once 01/29/20 0529 01/29/20 0535          Objective:   Vitals:   02/01/20 0330 02/01/20 0500 02/01/20 0800 02/01/20 0818  BP: (!) 164/62  (!) 144/61   Pulse:   85   Resp: (!) 30 (!) 21 20   Temp: 99.4 F (37.4 C)  98.7 F (37.1 C)   TempSrc: Oral  Oral   SpO2:  94% 94% 95%  Weight:      Height:        SpO2: 95 %  Wt Readings from Last 3 Encounters:  01/29/20 122.5 kg  12/26/19 128.8 kg  12/24/19 129.7 kg     Intake/Output Summary (Last 24 hours) at 02/01/2020 1044 Last data filed at 02/01/2020 0501 Gross per 24 hour  Intake 1327.95 ml  Output --  Net 1327.95 ml     Physical Exam  Awake Alert, No new F.N deficits,   [place].AT,PERRAL Supple Neck,No JVD, No cervical lymphadenopathy appriciated.  Symmetrical Chest wall movement, Good air movement bilaterally, CTAB RRR,No Gallops, Rubs or new Murmurs, No Parasternal Heave +ve B.Sounds, Abd Soft, No tenderness, No organomegaly appriciated, No rebound - guarding or rigidity. No Cyanosis, Clubbing or edema, No new Rash or bruise    Data Review:    Recent Labs  Lab 01/29/20 0321 01/29/20 0321 01/29/20 0430 01/29/20 0639 01/30/20 0351 01/31/20 0320 02/01/20 0255  WBC 11.5*  --   --   --  9.1 9.2 11.9*  HGB 10.7*   < > 12.2 12.6 10.5* 9.8* 10.1*  HCT 35.0*   < > 36.0 37.0 33.9* 30.9* 32.2*  PLT 272  --   --   --  241 219 255  MCV 79.4*   --   --   --  78.7* 78.2* 79.7*  MCH 24.3*  --   --   --  24.4* 24.8* 25.0*  MCHC 30.6  --   --   --  31.0 31.7 31.4  RDW 22.9*  --   --   --  23.0* 23.0* 23.6*  LYMPHSABS 2.7  --   --   --   --  2.2 2.4  MONOABS 0.6  --   --   --   --  0.6 0.6  EOSABS 0.4  --   --   --   --  0.4 0.0  BASOSABS 0.1  --   --   --   --  0.0 0.0   < > = values in this interval not displayed.    Recent Labs  Lab 01/29/20 0321 01/29/20 0321 01/29/20 0400 01/29/20 0430 01/29/20 0518 01/29/20 0639 01/30/20 0351 01/30/20 1106 01/31/20 0320 02/01/20 0255  NA 140   < >  --  142  --  142 138  --  141 140  K 2.7*   < >  --  2.6*  --  3.0* 3.5  --  3.5 3.9  CL 100  --   --   --   --   --  103  --  109 107  CO2 23  --   --   --   --   --  22  --  20* 21*  GLUCOSE 186*  --   --   --   --   --  343*  --  194* 181*  BUN 15  --   --   --   --   --  19  --  11 9  CREATININE 1.91*  --   --   --   --   --  1.54*  --  1.23* 1.09*  CALCIUM 9.0  --   --   --   --   --  8.8*  --  8.8* 9.2  AST 36  --   --   --   --   --   --   --   --   --   ALT 12  --   --   --   --   --   --   --   --   --   ALKPHOS 67  --   --   --   --   --   --   --   --   --   BILITOT 0.7  --   --   --   --   --   --   --   --   --   ALBUMIN 3.2*  --   --   --   --   --   --   --   --   --   MG  --   --   --   --  1.6*  --   --  1.6* 1.7 1.6*  HGBA1C  --   --   --   --   --   --   --  9.5*  --   --   AMMONIA  --   --  24  --   --   --   --   --   --   --   BNP  --   --   --   --   --   --   --   --  105.9* 348.3*   < > = values in this interval not displayed.    Recent Labs  Lab 01/29/20 0546 01/31/20 0320 02/01/20 0255  BNP  --  105.9* 348.3*  SARSCOV2NAA NEGATIVE  --   --     ------------------------------------------------------------------------------------------------------------------ No results for input(s): CHOL, HDL, LDLCALC, TRIG, CHOLHDL, LDLDIRECT in the last 72 hours.  Lab Results  Component Value Date   HGBA1C 9.5  (H) 01/30/2020   ------------------------------------------------------------------------------------------------------------------ No results for input(s): TSH, T4TOTAL, T3FREE, THYROIDAB in the last 72 hours.  Invalid input(s): FREET3 ------------------------------------------------------------------------------------------------------------------ No results for input(s): VITAMINB12, FOLATE, FERRITIN, TIBC, IRON, RETICCTPCT in the last 72 hours.  Coagulation profile  No results for input(s): INR, PROTIME in the last 168 hours.  No results for input(s): DDIMER in the last 72 hours.  Cardiac Enzymes No results for input(s): CKMB, TROPONINI, MYOGLOBIN in the last 168 hours.  Invalid input(s): CK ------------------------------------------------------------------------------------------------------------------    Component Value Date/Time   BNP 348.3 (H) 02/01/2020 0255   BNP 67.5 03/30/2017 1438    Micro Results Recent Results (from the past 240 hour(s))  Respiratory Panel by RT PCR (Flu A&B, Covid) - Nasopharyngeal Swab     Status: None   Collection Time: 01/29/20  5:46 AM   Specimen: Nasopharyngeal Swab  Result Value Ref Range Status   SARS Coronavirus 2 by RT PCR NEGATIVE NEGATIVE Final    Comment: (NOTE) SARS-CoV-2 target nucleic acids are NOT DETECTED. The SARS-CoV-2 RNA is generally detectable in upper respiratoy specimens during the acute phase of infection. The lowest concentration of SARS-CoV-2 viral copies this assay can detect is 131 copies/mL. A negative result does not preclude SARS-Cov-2 infection and should not be used as the sole basis for treatment or other patient management decisions. A negative result may occur with  improper specimen collection/handling, submission of specimen other than nasopharyngeal swab, presence of viral mutation(s) within the areas targeted by this assay, and inadequate number of viral copies (<131 copies/mL). A negative result must  be combined with clinical observations, patient history, and epidemiological information. The expected result is Negative. Fact Sheet for Patients:  https://www.moore.com/ Fact Sheet for Healthcare Providers:  https://www.young.biz/ This test is not yet ap proved or cleared by the [place] FDA and  has been authorized for detection and/or diagnosis of SARS-CoV-2 by FDA under an Emergency Use Authorization (EUA). This EUA will remain  in effect (meaning this test can be used) for the duration of the COVID-19 declaration under Section 564(b)(1) of the Act, 21 U.S.C. section 360bbb-3(b)(1), unless the authorization is terminated or revoked sooner.    Influenza A by PCR NEGATIVE NEGATIVE Final   Influenza B by PCR NEGATIVE NEGATIVE Final    Comment: (NOTE) The Xpert Xpress SARS-CoV-2/FLU/RSV assay is intended as an aid in  the diagnosis of influenza from Nasopharyngeal swab specimens and  should not be used as a sole basis for treatment. Nasal washings and  aspirates are unacceptable for Xpert Xpress SARS-CoV-2/FLU/RSV  testing. Fact Sheet for Patients: https://www.moore.com/ Fact Sheet for Healthcare Providers: https://www.young.biz/ This test is not yet approved or cleared by the [place] FDA and  has been authorized for detection and/or diagnosis of SARS-CoV-2 by  FDA under an Emergency Use Authorization (EUA). This EUA will remain  in effect (meaning this test can be used) for the duration of the  Covid-19 declaration under Section 564(b)(1) of the Act, 21  U.S.C. section 360bbb-3(b)(1), unless the authorization is  terminated or revoked. Performed at [facility], [address] [place], [place] [address]   Blood culture (routine x 2)     Status: None (Preliminary result)   Collection Time: 01/29/20  8:15 AM   Specimen: BLOOD LEFT HAND  Result Value Ref Range Status   Specimen  Description BLOOD LEFT HAND  Final   Special Requests   Final    BOTTLES DRAWN AEROBIC AND ANAEROBIC Blood Culture adequate volume   Culture   Final    NO GROWTH 3 DAYS Performed at [facility], [address] [place], [place] [address]    Report Status PENDING  Incomplete  Blood culture (routine x 2)  Status: None (Preliminary result)   Collection Time: 01/29/20  8:20 AM   Specimen: BLOOD LEFT ARM  Result Value Ref Range Status   Specimen Description BLOOD LEFT ARM  Final   Special Requests   Final    BOTTLES DRAWN AEROBIC AND ANAEROBIC Blood Culture adequate volume   Culture   Final    NO GROWTH 3 DAYS Performed at [facility], [address] [place], [place] [address]    Report Status PENDING  Incomplete    Radiology Reports DG Abdomen 1 View  Result Date: 01/29/2020 CLINICAL DATA:  Sickle cell.  Altered mental status EXAM: ABDOMEN - 1 VIEW COMPARISON:  None. FINDINGS: The nonspecific bowel gas pattern without evidence of obstruction. Air is seen within large and small bowel loops throughout the abdomen. No radio-opaque calculi or other significant radiographic abnormality are seen. No acute osseous findings. IMPRESSION: Nonspecific bowel gas pattern. Electronically Signed   By: Duanne Guess D.O.   On: 01/29/2020 13:34   CT Head Wo Contrast  Result Date: 01/29/2020 CLINICAL DATA:  . Found unresponsive. Recent fall. EXAM: CT HEAD WITHOUT CONTRAST CT CERVICAL SPINE WITHOUT CONTRAST TECHNIQUE: Multidetector CT imaging of the head and cervical spine was performed following the standard protocol without intravenous contrast. Multiplanar CT image reconstructions of the cervical spine were also generated. COMPARISON:  None. FINDINGS: CT HEAD FINDINGS Brain: There is no mass, hemorrhage or extra-axial collection. The size and configuration of the ventricles and extra-axial CSF spaces are normal. The brain parenchyma is normal, without evidence of acute or chronic  infarction. Vascular: No abnormal hyperdensity of the major intracranial arteries or dural venous sinuses. No intracranial atherosclerosis. Skull: The visualized skull base, calvarium and extracranial soft tissues are normal. Sinuses/Orbits: No fluid levels or advanced mucosal thickening of the visualized paranasal sinuses. No mastoid or middle ear effusion. The orbits are normal. CT CERVICAL SPINE FINDINGS Alignment: No static subluxation. Facets are aligned. Occipital condyles are normally positioned. Skull base and vertebrae: No acute fracture. Soft tissues and spinal canal: No prevertebral fluid or swelling. No visible canal hematoma. Disc levels: No advanced spinal canal or neural foraminal stenosis. Upper chest: No pneumothorax, pulmonary nodule or pleural effusion. Other: Normal visualized paraspinal cervical soft tissues. IMPRESSION: 1. No acute intracranial abnormality. 2. No acute fracture or static subluxation of the cervical spine. Electronically Signed   By: Deatra Robinson M.D.   On: 01/29/2020 03:58   CT Cervical Spine Wo Contrast  Result Date: 01/29/2020 CLINICAL DATA:  . Found unresponsive. Recent fall. EXAM: CT HEAD WITHOUT CONTRAST CT CERVICAL SPINE WITHOUT CONTRAST TECHNIQUE: Multidetector CT imaging of the head and cervical spine was performed following the standard protocol without intravenous contrast. Multiplanar CT image reconstructions of the cervical spine were also generated. COMPARISON:  None. FINDINGS: CT HEAD FINDINGS Brain: There is no mass, hemorrhage or extra-axial collection. The size and configuration of the ventricles and extra-axial CSF spaces are normal. The brain parenchyma is normal, without evidence of acute or chronic infarction. Vascular: No abnormal hyperdensity of the major intracranial arteries or dural venous sinuses. No intracranial atherosclerosis. Skull: The visualized skull base, calvarium and extracranial soft tissues are normal. Sinuses/Orbits: No fluid levels  or advanced mucosal thickening of the visualized paranasal sinuses. No mastoid or middle ear effusion. The orbits are normal. CT CERVICAL SPINE FINDINGS Alignment: No static subluxation. Facets are aligned. Occipital condyles are normally positioned. Skull base and vertebrae: No acute fracture. Soft tissues and spinal canal: No prevertebral fluid or  swelling. No visible canal hematoma. Disc levels: No advanced spinal canal or neural foraminal stenosis. Upper chest: No pneumothorax, pulmonary nodule or pleural effusion. Other: Normal visualized paraspinal cervical soft tissues. IMPRESSION: 1. No acute intracranial abnormality. 2. No acute fracture or static subluxation of the cervical spine. Electronically Signed   By: Deatra Robinson M.D.   On: 01/29/2020 03:58   MR BRAIN WO CONTRAST  Result Date: 01/30/2020 CLINICAL DATA:  Altered mental status, unclear cause. EXAM: MRI HEAD WITHOUT CONTRAST TECHNIQUE: Multiplanar, multiecho pulse sequences of the brain and surrounding structures were obtained without intravenous contrast. COMPARISON:  Head CT 01/29/2020, brain MRI 02/14/2019 FINDINGS: Brain: There is no evidence of acute infarct. No evidence of intracranial mass. No midline shift or extra-axial fluid collection. No chronic intracranial blood products. No focal parenchymal signal abnormality is demonstrated. Cerebral volume is normal for age. Vascular: Flow voids maintained within the proximal large arterial vessels. Skull and upper cervical spine: No focal marrow lesion. T1 hypointense marrow signal within the visualized upper cervical spine. Sinuses/Orbits: Visualized orbits demonstrate no acute abnormality. Mild paranasal sinus mucosal thickening greatest within bilateral ethmoid air cells and within the left sphenoid sinus. No significant mastoid effusion. IMPRESSION: Unremarkable non-contrast MRI appearance of the brain. No evidence of acute intracranial abnormality. Mild paranasal sinus mucosal thickening.  T1 hypointense marrow signal within the visualized upper cervical spine. While this finding may be seen in the setting of a marrow infiltrative process, the most common causes include chronic anemia, smoking and obesity. Electronically Signed   By: Jackey Loge DO   On: 01/30/2020 18:56   CT ABDOMEN PELVIS W CONTRAST  Result Date: 01/31/2020 CLINICAL DATA:  Abdominal distension. Nausea and vomiting. EXAM: CT ABDOMEN AND PELVIS WITH CONTRAST TECHNIQUE: Multidetector CT imaging of the abdomen and pelvis was performed using the standard protocol following bolus administration of intravenous contrast. CONTRAST:  OMNIPAQUE IOHEXOL 300 MG/ML  SOLN COMPARISON:  None. FINDINGS: Lower chest: No acute abnormality. Hepatobiliary: Scattered hypodense areas within the liver, the most masslike appreciated at the posterior-inferior margin of the RIGHT liver lobe measuring 8.4 cm, most likely atypical geographic fatty infiltration but neoplastic or infectious process cannot be entirely excluded. Gallbladder appears normal. No bile duct dilatation. Pancreas: Unremarkable. No pancreatic ductal dilatation or surrounding inflammatory changes. Spleen: Normal in size without focal abnormality. Adrenals/Urinary Tract: Adrenal glands appear normal. Kidneys are unremarkable without mass, stone or hydronephrosis. No perinephric fluid. No ureteral or bladder calculi. Bladder is unremarkable, partially decompressed. Stomach/Bowel: No dilated large or small bowel loops. No evidence of bowel wall inflammation. Appendix is normal. Stomach is unremarkable, partially decompressed. Vascular/Lymphatic: No significant vascular findings are present. No enlarged abdominal or pelvic lymph nodes. Reproductive: Probable uterine fibroids. No adnexal mass or free fluid seen. Other: No free fluid or abscess collection. No free intraperitoneal air. Musculoskeletal: No acute or suspicious osseous finding. IMPRESSION: 1. Scattered hypodense areas  within the liver, the most masslike appreciated at the posterior-inferior margin of the RIGHT liver lobe measuring 8.4 cm, most likely atypical geographic fatty infiltration but neoplastic or infectious process cannot be entirely excluded. Recommend nonemergent liver MRI with contrast for further characterization. When the patient is clinically stable and able to follow directions and hold their breath (preferably as an outpatient) further evaluation with dedicated abdominal MRI should be considered. 2. Remainder of the abdomen and pelvis CT is unremarkable. No bowel obstruction or evidence of bowel wall inflammation. No free fluid or abscess collection. No evidence of acute solid organ  abnormality. No renal or ureteral calculi. Appendix is normal. Electronically Signed   By: Bary Richard M.D.   On: 01/31/2020 17:06   DG Chest Portable 1 View  Result Date: 01/29/2020 CLINICAL DATA:  Altered mental status EXAM: PORTABLE CHEST 1 VIEW COMPARISON:  02/07/2019 FINDINGS: Cardiomegaly accentuated by portable technique. Minimal streaky density at the right base, likely atelectasis. Negative aortic and hilar contours for technique. No effusion or pneumothorax. IMPRESSION: Cardiomegaly without failure. Probable mild atelectasis on the right. Electronically Signed   By: Marnee Spring M.D.   On: 01/29/2020 04:46   DG Abd Portable 1V  Result Date: 01/31/2020 CLINICAL DATA:  Nausea and vomiting. EXAM: PORTABLE ABDOMEN - 1 VIEW COMPARISON:  Radiograph 01/29/2020. FINDINGS: No evidence of free air on supine views. No bowel dilatation to suggest obstruction. No evidence of radiopaque calculi, sensitivity diminished by soft tissue attenuation from habitus. No other interval change. IMPRESSION: Nonobstructive bowel gas pattern. Electronically Signed   By: Narda Rutherford M.D.   On: 01/31/2020 15:07   EEG adult  Result Date: 01/30/2020 Charlsie Quest, MD     01/30/2020 11:16 AM Patient Name: KEEANNA VILLAFRANCA MRN:  [id number] Epilepsy Attending: Charlsie Quest Referring Physician/Provider: Dr. Susa Raring Date: 01/30/2020 Duration: 23.51 mins Patient history: 44 year old female with history of diabetes, polysubstance abuse, seizures presented with altered mental status.  EEG evaluate for seizures. Level of alertness: Awake AEDs during EEG study: None Technical aspects: This EEG study was done with scalp electrodes positioned according to the 10-20 International system of electrode placement. Electrical activity was acquired at a sampling rate of  and reviewed with a high frequency filter of  and a low frequency filter of . EEG data were recorded continuously and digitally stored. Description: Although difficult to appreciate due to significant electrode artifact throughout the EEG, the posterior dominant rhythm of 9 Hz activity of moderate voltage (25-35 uV) seen predominantly in posterior head regions, symmetric was noted.  Hyperventilation and photic stimulation were not performed. Of note, EEG was technically difficult due to eyelid flutter artifact throughout the study. IMPRESSION: This technically difficult study is within normal limits. No seizures or epileptiform discharges were seen throughout the recording. Priyanka Annabelle Harman    Time Spent in minutes  30   Susa Raring M.D on 02/01/2020 at 10:44 AM  To page go to [email or web address] - password [facility]

## 2020-02-01 NOTE — Progress Notes (Signed)
OT Cancellation Note  Patient Details Name: Jill Shaw MRN: [id number] DOB: 10-21-1975   Cancelled Treatment:    Reason Eval/Treat Not Completed: Medical issues which prohibited therapy(Pt very lethargic, still continues to dry heave/vomit consistently. Pt reports she has been up and walking around the room, using the bathroom. May not have OT needs but will try one more time for OT eval per pt tolerance.)  Dalphine Handing, MSOT, OTR/L Acute [facility] Office Number: [phone] Pager: [phone]  Dalphine Handing 02/01/2020, 10:05 AM

## 2020-02-02 ENCOUNTER — Ambulatory Visit: Payer: No Typology Code available for payment source

## 2020-02-02 LAB — CBC WITH DIFFERENTIAL/PLATELET
Abs Immature Granulocytes: 0.07 10*3/uL (ref 0.00–0.07)
Basophils Absolute: 0.1 10*3/uL (ref 0.0–0.1)
Basophils Relative: 1 %
Eosinophils Absolute: 0.3 10*3/uL (ref 0.0–0.5)
Eosinophils Relative: 2 %
HCT: 30.9 % — ABNORMAL LOW (ref 36.0–46.0)
Hemoglobin: 9.5 g/dL — ABNORMAL LOW (ref 12.0–15.0)
Immature Granulocytes: 1 %
Lymphocytes Relative: 18 %
Lymphs Abs: 2.1 10*3/uL (ref 0.7–4.0)
MCH: 24.8 pg — ABNORMAL LOW (ref 26.0–34.0)
MCHC: 30.7 g/dL (ref 30.0–36.0)
MCV: 80.7 fL (ref 80.0–100.0)
Monocytes Absolute: 0.6 10*3/uL (ref 0.1–1.0)
Monocytes Relative: 5 %
Neutro Abs: 8.7 10*3/uL — ABNORMAL HIGH (ref 1.7–7.7)
Neutrophils Relative %: 73 %
Platelets: 236 10*3/uL (ref 150–400)
RBC: 3.83 MIL/uL — ABNORMAL LOW (ref 3.87–5.11)
RDW: 24 % — ABNORMAL HIGH (ref 11.5–15.5)
WBC: 11.7 10*3/uL — ABNORMAL HIGH (ref 4.0–10.5)
nRBC: 0.3 % — ABNORMAL HIGH (ref 0.0–0.2)

## 2020-02-02 LAB — COMPREHENSIVE METABOLIC PANEL
ALT: 15 U/L (ref 0–44)
AST: 31 U/L (ref 15–41)
Albumin: 2.9 g/dL — ABNORMAL LOW (ref 3.5–5.0)
Alkaline Phosphatase: 46 U/L (ref 38–126)
Anion gap: 11 (ref 5–15)
BUN: 7 mg/dL (ref 6–20)
CO2: 24 mmol/L (ref 22–32)
Calcium: 9.2 mg/dL (ref 8.9–10.3)
Chloride: 106 mmol/L (ref 98–111)
Creatinine, Ser: 1.16 mg/dL — ABNORMAL HIGH (ref 0.44–1.00)
GFR calc Af Amer: >60 mL/min (ref >60)
GFR calc non Af Amer: 58 mL/min — ABNORMAL LOW (ref >60)
Glucose, Bld: 62 mg/dL — ABNORMAL LOW (ref 70–99)
Potassium: 3.4 mmol/L — ABNORMAL LOW (ref 3.5–5.1)
Sodium: 141 mmol/L (ref 135–145)
Total Bilirubin: 0.4 mg/dL (ref 0.3–1.2)
Total Protein: 6.1 g/dL — ABNORMAL LOW (ref 6.5–8.1)

## 2020-02-02 LAB — URINE CULTURE: Culture: NO GROWTH

## 2020-02-02 LAB — GLUCOSE, CAPILLARY
Glucose-Capillary: 100 mg/dL — ABNORMAL HIGH (ref 70–99)
Glucose-Capillary: 160 mg/dL — ABNORMAL HIGH (ref 70–99)
Glucose-Capillary: 175 mg/dL — ABNORMAL HIGH (ref 70–99)
Glucose-Capillary: 189 mg/dL — ABNORMAL HIGH (ref 70–99)
Glucose-Capillary: 59 mg/dL — ABNORMAL LOW (ref 70–99)
Glucose-Capillary: 67 mg/dL — ABNORMAL LOW (ref 70–99)
Glucose-Capillary: 89 mg/dL (ref 70–99)

## 2020-02-02 LAB — URINE CYTOLOGY ANCILLARY ONLY
Chlamydia: NEGATIVE
Comment: NEGATIVE
Comment: NEGATIVE
Comment: NORMAL
Neisseria Gonorrhea: NEGATIVE
Trichomonas: NEGATIVE

## 2020-02-02 LAB — GC/CHLAMYDIA PROBE AMP (~~LOC~~) NOT AT ARMC
Chlamydia: NEGATIVE
Comment: NEGATIVE
Comment: NORMAL
Neisseria Gonorrhea: NEGATIVE

## 2020-02-02 LAB — MAGNESIUM: Magnesium: 1.8 mg/dL (ref 1.7–2.4)

## 2020-02-02 LAB — BRAIN NATRIURETIC PEPTIDE: B Natriuretic Peptide: 277.7 pg/mL — ABNORMAL HIGH (ref 0.0–100.0)

## 2020-02-02 LAB — LACTIC ACID, PLASMA: Lactic Acid, Venous: 1.1 mmol/L (ref 0.5–1.9)

## 2020-02-02 MED ORDER — POTASSIUM CHLORIDE CRYS ER 20 MEQ PO TBCR
40.0000 meq | EXTENDED_RELEASE_TABLET | Freq: Once | ORAL | Status: AC
  Administered 2020-02-02: 40 meq via ORAL
  Filled 2020-02-02: qty 2

## 2020-02-02 MED ORDER — MAGNESIUM SULFATE IN D5W 1-5 GM/100ML-% IV SOLN
1.0000 g | Freq: Once | INTRAVENOUS | Status: AC
  Administered 2020-02-02: 1 g via INTRAVENOUS
  Filled 2020-02-02: qty 100

## 2020-02-02 MED ORDER — INSULIN ASPART PROT & ASPART (70-30 MIX) 100 UNIT/ML ~~LOC~~ SUSP
90.0000 [IU] | Freq: Two times a day (BID) | SUBCUTANEOUS | Status: DC
  Administered 2020-02-02 – 2020-02-03 (×2): 90 [IU] via SUBCUTANEOUS
  Filled 2020-02-02: qty 10

## 2020-02-02 MED ORDER — INSULIN ASPART 100 UNIT/ML ~~LOC~~ SOLN
4.0000 [IU] | Freq: Three times a day (TID) | SUBCUTANEOUS | Status: DC
  Administered 2020-02-02 – 2020-02-03 (×3): 4 [IU] via SUBCUTANEOUS

## 2020-02-02 MED ORDER — FUROSEMIDE 10 MG/ML IJ SOLN
40.0000 mg | Freq: Once | INTRAMUSCULAR | Status: AC
  Administered 2020-02-02: 40 mg via INTRAVENOUS
  Filled 2020-02-02: qty 4

## 2020-02-02 MED FILL — OMEPRAZOLE DR 40 MG CAPSULE: 40 | 30 days supply | Qty: 30 | Fill #1

## 2020-02-02 MED FILL — ?FUROSEMIDE 80MG TABLET: 80 | 30 days supply | Qty: 30 | Fill #2

## 2020-02-02 MED FILL — METOPROLOL SUCCINATE ER 100: 100 | 30 days supply | Qty: 30 | Fill #1

## 2020-02-02 MED FILL — HYDROCORTISONE 2.5% CREAM: 2.5 | 15 days supply | Qty: 30 | Fill #2

## 2020-02-02 NOTE — Progress Notes (Signed)
PROGRESS NOTE                                                                                                                                                                                                             Patient Demographics:    Jill Shaw, is a 44 y.o. female, DOB - Jul 24, 1976, URK:[id number]  Admit date - 01/29/2020   Admitting Physician Rise Patience, MD  Outpatient Primary MD for the patient is Vevelyn Francois, NP  LOS - 3  Chief Complaint  Patient presents with   Altered Mental Status       Brief Narrative - Jill Shaw is a 44 y.o. female with medical history significant of morbid obesity; tobacco dependence; seizures; HTN; DM; and COPD presenting with AMS.  Her mother said that she passed out, was unconscious last night, apparently she has had a few falls over the last few days, has chronic right lower extremity weakness which he attributes to arthritis and neuropathy.  Currently no headache or new focal weakness, apparently patient was due for outpatient MRI as well.  In the ER her work-up for falls suggested AKI and she was admitted to the hospital.   Subjective:   Patient in bed, appears comfortable, denies any headache, no fever, no chest pain or pressure, no shortness of breath , no abdominal pain. No focal weakness.   Assessment  & Plan :     1.  Metabolic encephalopathy associated with multiple falls & AKI - she likely has some element of dehydration along could have been worse due to underlying use of narcotics and muscle relaxers.  Currently no focal deficits, CT head and C-spine unremarkable, EEG unremarkable, MRI non acute.  She is now close to her baseline after supportive care and hydration with IV fluids, being seen by PT OT, this problem seems to have resolved.  2. AKI with hypokalemia.  Due to dehydration resolved after IV fluids, ACE inhibitors and NSAIDs held.  Lactic acid improving after hydration with IV fluids.  No other  signs of infection, do not think she is septic.  3.  Chronic pain.  Supportive care.  Minimize narcotics and benzo use.  4.  COPD .  Supportive care no wheezing.  5.  Essential hypertension.  Blood pressure was low upon admission, Norvasc and monitor.  6.  Morbid obesity with BMI of 46.  Follow with PCP for weight loss.  7.  Smoking.  Counseled to quit.  8.  History of  seizures in the remote past.  States this was in the setting of hypoglycemia, question if they were true seizures, EEG unremarkable.  9.  Suprapubic abdominal pain, persistent nausea, unclear etiology -  she is passing flatus and having bowel movements and abdominal exam appears to be benign, CT abdomen pelvis nonacute with some chronic liver findings for which she requires outpatient liver MRI, UA is borderline hence will give her a trial of Rocephin for 3 days starting 02/01/2020, he was seen and cleared by [facility] service this admission as well.  Of note patient was restarted on her home dose Zanaflex with remarkable improvement in her symptoms, this could have been withdrawal from Zanaflex, counseled to gradually taper it off.  Urine GC chlamydia and trichomonas requested by OB services still pending.  10.  DM type II.  Due to poor oral intake her 75/25 insulin was cut down, will advance diet and increase her insulin, continue sliding scale monitor and adjust.  Lab Results  Component Value Date   HGBA1C 9.5 (H) 01/30/2020   CBG (last 3)  Recent Labs    02/01/20 2114 02/02/20 0557 02/02/20 1015  GLUCAP 144* 89 160*    Addendum patient continues to complain of ongoing nausea, question if she is withdrawing from either muscle relaxers or narcotics, exam remains benign, will repeat x-ray, bladder scan along with lipase, if x-ray unrevealing BMP check CT scan to rule out any ongoing GI pathology.    Family Communication  : Mother bedside on 01/31/2020, 02/01/20, 02/02/2020  Code Status :  Full  Disposition Plan  : Stay in  the hospital needs thorough PT OT eval to assess fall risk and ambulatory capacity.  Consults  :  OB  Procedures  :    CT abdomen pelvis.  Kindly look at the report.  Nonspecific liver findings.  Needs dedicated liver MRI in the outpatient setting.    CT head and C-spine.  Non Acute  EEG.  No seizure focus.  MRI brain - no acute findings.  DVT Prophylaxis  :  Lovenox    Lab Results  Component Value Date   PLT 236 02/02/2020    Diet :  Diet Order            DIET SOFT Room service appropriate? Yes; Fluid consistency: Thin  Diet effective now               Inpatient Medications Scheduled Meds:  amLODipine  10 mg Oral Daily   docusate sodium  100 mg Oral BID   enoxaparin (LOVENOX) injection  40 mg Subcutaneous Q24H   fluticasone  2 spray Each Nare Daily   hydrALAZINE  100 mg Oral Q8H   insulin aspart  0-20 Units Subcutaneous TID WC   insulin aspart  0-5 Units Subcutaneous QHS   insulin aspart protamine- aspart  45 Units Subcutaneous BID WC   loratadine  10 mg Oral Daily   melatonin  3 mg Oral QHS   mometasone-formoterol  2 puff Inhalation BID   nicotine  14 mg Transdermal Daily   norethindrone  10 mg Oral Q8H   pantoprazole  40 mg Oral Daily   sodium chloride flush  3 mL Intravenous Q12H   tiZANidine  4 mg Oral TID   Continuous Infusions:  cefTRIAXone (ROCEPHIN)  IV 1 g (02/02/20 1007)   magnesium sulfate bolus IVPB     PRN Meds:.acetaminophen **OR** [DISCONTINUED] acetaminophen, albuterol, bisacodyl, hydrALAZINE, [DISCONTINUED] ondansetron **OR** ondansetron (ZOFRAN) IV, polyethylene glycol, promethazine, traMADol  Antibiotics  :   Anti-infectives (From admission, onward)   Start     Dose/Rate Route Frequency Ordered Stop   02/01/20 1000  cefTRIAXone (ROCEPHIN) 1 g in sodium chloride 0.9 % 100 mL IVPB     1 g 200 mL/hr over 30 Minutes Intravenous Every 24 hours 02/01/20 0845 02/04/20 0959   01/29/20 0545  vancomycin (VANCOREADY) IVPB 2000  mg/400 mL     2,000 mg 200 mL/hr over 120 Minutes Intravenous  Once 01/29/20 0535 01/29/20 0856   01/29/20 0530  ceFEPIme (MAXIPIME) 2 g in sodium chloride 0.9 % 100 mL IVPB     2 g 200 mL/hr over 30 Minutes Intravenous  Once 01/29/20 0529 01/29/20 0652   01/29/20 0530  metroNIDAZOLE (FLAGYL) IVPB 500 mg     500 mg 100 mL/hr over 60 Minutes Intravenous  Once 01/29/20 0529 01/29/20 0955   01/29/20 0530  vancomycin (VANCOCIN) IVPB 1000 mg/200 mL premix  Status:  Discontinued     1,000 mg 200 mL/hr over 60 Minutes Intravenous  Once 01/29/20 0529 01/29/20 0535          Objective:   Vitals:   02/02/20 0504 02/02/20 0659 02/02/20 0754 02/02/20 0951  BP: (!) 129/56 (!) 106/44  138/69  Pulse:  79  83  Resp:  19  20  Temp:  99.7 F (37.6 C)  98.8 F (37.1 C)  TempSrc:  Oral  Oral  SpO2:  92% 91% 98%  Weight:      Height:        SpO2: 98 %  Wt Readings from Last 3 Encounters:  01/29/20 122.5 kg  12/26/19 128.8 kg  12/24/19 129.7 kg     Intake/Output Summary (Last 24 hours) at 02/02/2020 1044 Last data filed at 02/02/2020 1007 Gross per 24 hour  Intake 1323.11 ml  Output 1 ml  Net 1322.11 ml     Physical Exam  Awake Alert, No new F.N deficits, Normal affect .AT,PERRAL Supple Neck,No JVD, No cervical lymphadenopathy appriciated.  Symmetrical Chest wall movement, Good air movement bilaterally, CTAB RRR,No Gallops, Rubs or new Murmurs, No Parasternal Heave +ve B.Sounds, Abd Soft, No tenderness, No organomegaly appriciated, No rebound - guarding or rigidity. No Cyanosis, Clubbing or edema, No new Rash or bruise     Data Review:    Recent Labs  Lab 01/29/20 0321 01/29/20 0430 01/29/20 1540 01/30/20 0351 01/31/20 0320 02/01/20 0255 02/02/20 0220  WBC 11.5*  --   --  9.1 9.2 11.9* 11.7*  HGB 10.7*   < > 12.6 10.5* 9.8* 10.1* 9.5*  HCT 35.0*   < > 37.0 33.9* 30.9* 32.2* 30.9*  PLT 272  --   --  241 219 255 236  MCV 79.4*  --   --  78.7* 78.2* 79.7* 80.7   MCH 24.3*  --   --  24.4* 24.8* 25.0* 24.8*  MCHC 30.6  --   --  31.0 31.7 31.4 30.7  RDW 22.9*  --   --  23.0* 23.0* 23.6* 24.0*  LYMPHSABS 2.7  --   --   --  2.2 2.4 2.1  MONOABS 0.6  --   --   --  0.6 0.6 0.6  EOSABS 0.4  --   --   --  0.4 0.0 0.3  BASOSABS 0.1  --   --   --  0.0 0.0 0.1   < > = values in this interval not displayed.    Recent Labs  Lab 01/29/20 0321 01/29/20  0400 01/29/20 0430 01/29/20 0518 01/29/20 0639 01/30/20 0351 01/30/20 1106 01/31/20 0320 02/01/20 0255 02/02/20 0220  NA 140  --    < >  --  142 138  --  141 140 141  K 2.7*  --    < >  --  3.0* 3.5  --  3.5 3.9 3.4*  CL 100  --   --   --   --  103  --  109 107 106  CO2 23  --   --   --   --  22  --  20* 21* 24  GLUCOSE 186*  --   --   --   --  343*  --  194* 181* 62*  BUN 15  --   --   --   --  19  --  11 9 7   CREATININE 1.91*  --   --   --   --  1.54*  --  1.23* 1.09* 1.16*  CALCIUM 9.0  --   --   --   --  8.8*  --  8.8* 9.2 9.2  AST 36  --   --   --   --   --   --   --   --  31  ALT 12  --   --   --   --   --   --   --   --  15  ALKPHOS 67  --   --   --   --   --   --   --   --  46  BILITOT 0.7  --   --   --   --   --   --   --   --  0.4  ALBUMIN 3.2*  --   --   --   --   --   --   --   --  2.9*  MG  --   --   --  1.6*  --   --  1.6* 1.7 1.6* 1.8  HGBA1C  --   --   --   --   --   --  9.5*  --   --   --   AMMONIA  --  24  --   --   --   --   --   --   --   --   BNP  --   --   --   --   --   --   --  105.9* 348.3* 277.7*   < > = values in this interval not displayed.    Recent Labs  Lab 01/29/20 0546 01/31/20 0320 02/01/20 0255 02/02/20 0220  BNP  --  105.9* 348.3* 277.7*  SARSCOV2NAA NEGATIVE  --   --   --     ------------------------------------------------------------------------------------------------------------------ No results for input(s): CHOL, HDL, LDLCALC, TRIG, CHOLHDL, LDLDIRECT in the last 72 hours.  Lab Results  Component Value Date   HGBA1C 9.5 (H) 01/30/2020    ------------------------------------------------------------------------------------------------------------------ No results for input(s): TSH, T4TOTAL, T3FREE, THYROIDAB in the last 72 hours.  Invalid input(s): FREET3 ------------------------------------------------------------------------------------------------------------------ No results for input(s): VITAMINB12, FOLATE, FERRITIN, TIBC, IRON, RETICCTPCT in the last 72 hours.  Coagulation profile No results for input(s): INR, PROTIME in the last 168 hours.  No results for input(s): DDIMER in the last 72 hours.  Cardiac Enzymes No results for input(s): CKMB, TROPONINI, MYOGLOBIN in the last 168 hours.  Invalid input(s): CK ------------------------------------------------------------------------------------------------------------------  Component Value Date/Time   BNP 277.7 (H) 02/02/2020 0220   BNP 67.5 03/30/2017 1438    Micro Results Recent Results (from the past 240 hour(s))  Respiratory Panel by RT PCR (Flu A&B, Covid) - Nasopharyngeal Swab     Status: None   Collection Time: 01/29/20  5:46 AM   Specimen: Nasopharyngeal Swab  Result Value Ref Range Status   SARS Coronavirus 2 by RT PCR NEGATIVE NEGATIVE Final    Comment: (NOTE) SARS-CoV-2 target nucleic acids are NOT DETECTED. The SARS-CoV-2 RNA is generally detectable in upper respiratoy specimens during the acute phase of infection. The lowest concentration of SARS-CoV-2 viral copies this assay can detect is 131 copies/mL. A negative result does not preclude SARS-Cov-2 infection and should not be used as the sole basis for treatment or other patient management decisions. A negative result may occur with  improper specimen collection/handling, submission of specimen other than nasopharyngeal swab, presence of viral mutation(s) within the areas targeted by this assay, and inadequate number of viral copies (<131 copies/mL). A negative result must be combined  with clinical observations, patient history, and epidemiological information. The expected result is Negative. Fact Sheet for Patients:  https://www.moore.com/https://www.fda.gov/media/142436/download Fact Sheet for Healthcare Providers:  https://www.young.biz/https://www.fda.gov/media/142435/download This test is not yet ap proved or cleared by the [place] FDA and  has been authorized for detection and/or diagnosis of SARS-CoV-2 by FDA under an Emergency Use Authorization (EUA). This EUA will remain  in effect (meaning this test can be used) for the duration of the COVID-19 declaration under Section 564(b)(1) of the Act, 21 U.S.C. section 360bbb-3(b)(1), unless the authorization is terminated or revoked sooner.    Influenza A by PCR NEGATIVE NEGATIVE Final   Influenza B by PCR NEGATIVE NEGATIVE Final    Comment: (NOTE) The Xpert Xpress SARS-CoV-2/FLU/RSV assay is intended as an aid in  the diagnosis of influenza from Nasopharyngeal swab specimens and  should not be used as a sole basis for treatment. Nasal washings and  aspirates are unacceptable for Xpert Xpress SARS-CoV-2/FLU/RSV  testing. Fact Sheet for Patients: https://www.moore.com/https://www.fda.gov/media/142436/download Fact Sheet for Healthcare Providers: https://www.young.biz/https://www.fda.gov/media/142435/download This test is not yet approved or cleared by the [place] FDA and  has been authorized for detection and/or diagnosis of SARS-CoV-2 by  FDA under an Emergency Use Authorization (EUA). This EUA will remain  in effect (meaning this test can be used) for the duration of the  Covid-19 declaration under Section 564(b)(1) of the Act, 21  U.S.C. section 360bbb-3(b)(1), unless the authorization is  terminated or revoked. Performed at [facility], [address] [place], [place] [address]   Blood culture (routine x 2)     Status: None (Preliminary result)   Collection Time: 01/29/20  8:15 AM   Specimen: BLOOD LEFT HAND  Result Value Ref Range Status   Specimen Description BLOOD  LEFT HAND  Final   Special Requests   Final    BOTTLES DRAWN AEROBIC AND ANAEROBIC Blood Culture adequate volume   Culture   Final    NO GROWTH 3 DAYS Performed at [facility], [address] [place], [place] [address]    Report Status PENDING  Incomplete  Blood culture (routine x 2)     Status: None (Preliminary result)   Collection Time: 01/29/20  8:20 AM   Specimen: BLOOD LEFT ARM  Result Value Ref Range Status   Specimen Description BLOOD LEFT ARM  Final   Special Requests   Final    BOTTLES  DRAWN AEROBIC AND ANAEROBIC Blood Culture adequate volume   Culture   Final    NO GROWTH 3 DAYS Performed at [facility], [address], [place], [place] [address]    Report Status PENDING  Incomplete    Radiology Reports DG Abdomen 1 View  Result Date: 01/29/2020 CLINICAL DATA:  Sickle cell.  Altered mental status EXAM: ABDOMEN - 1 VIEW COMPARISON:  None. FINDINGS: The nonspecific bowel gas pattern without evidence of obstruction. Air is seen within large and small bowel loops throughout the abdomen. No radio-opaque calculi or other significant radiographic abnormality are seen. No acute osseous findings. IMPRESSION: Nonspecific bowel gas pattern. Electronically Signed   By: Duanne Guess D.O.   On: 01/29/2020 13:34   CT Head Wo Contrast  Result Date: 01/29/2020 CLINICAL DATA:  . Found unresponsive. Recent fall. EXAM: CT HEAD WITHOUT CONTRAST CT CERVICAL SPINE WITHOUT CONTRAST TECHNIQUE: Multidetector CT imaging of the head and cervical spine was performed following the standard protocol without intravenous contrast. Multiplanar CT image reconstructions of the cervical spine were also generated. COMPARISON:  None. FINDINGS: CT HEAD FINDINGS Brain: There is no mass, hemorrhage or extra-axial collection. The size and configuration of the ventricles and extra-axial CSF spaces are normal. The brain parenchyma is normal, without evidence of acute or chronic infarction. Vascular: No  abnormal hyperdensity of the major intracranial arteries or dural venous sinuses. No intracranial atherosclerosis. Skull: The visualized skull base, calvarium and extracranial soft tissues are normal. Sinuses/Orbits: No fluid levels or advanced mucosal thickening of the visualized paranasal sinuses. No mastoid or middle ear effusion. The orbits are normal. CT CERVICAL SPINE FINDINGS Alignment: No static subluxation. Facets are aligned. Occipital condyles are normally positioned. Skull base and vertebrae: No acute fracture. Soft tissues and spinal canal: No prevertebral fluid or swelling. No visible canal hematoma. Disc levels: No advanced spinal canal or neural foraminal stenosis. Upper chest: No pneumothorax, pulmonary nodule or pleural effusion. Other: Normal visualized paraspinal cervical soft tissues. IMPRESSION: 1. No acute intracranial abnormality. 2. No acute fracture or static subluxation of the cervical spine. Electronically Signed   By: Deatra Robinson M.D.   On: 01/29/2020 03:58   CT Cervical Spine Wo Contrast  Result Date: 01/29/2020 CLINICAL DATA:  . Found unresponsive. Recent fall. EXAM: CT HEAD WITHOUT CONTRAST CT CERVICAL SPINE WITHOUT CONTRAST TECHNIQUE: Multidetector CT imaging of the head and cervical spine was performed following the standard protocol without intravenous contrast. Multiplanar CT image reconstructions of the cervical spine were also generated. COMPARISON:  None. FINDINGS: CT HEAD FINDINGS Brain: There is no mass, hemorrhage or extra-axial collection. The size and configuration of the ventricles and extra-axial CSF spaces are normal. The brain parenchyma is normal, without evidence of acute or chronic infarction. Vascular: No abnormal hyperdensity of the major intracranial arteries or dural venous sinuses. No intracranial atherosclerosis. Skull: The visualized skull base, calvarium and extracranial soft tissues are normal. Sinuses/Orbits: No fluid levels or advanced mucosal  thickening of the visualized paranasal sinuses. No mastoid or middle ear effusion. The orbits are normal. CT CERVICAL SPINE FINDINGS Alignment: No static subluxation. Facets are aligned. Occipital condyles are normally positioned. Skull base and vertebrae: No acute fracture. Soft tissues and spinal canal: No prevertebral fluid or swelling. No visible canal hematoma. Disc levels: No advanced spinal canal or neural foraminal stenosis. Upper chest: No pneumothorax, pulmonary nodule or pleural effusion. Other: Normal visualized paraspinal cervical soft tissues. IMPRESSION: 1. No acute intracranial abnormality. 2. No acute fracture or static subluxation  of the cervical spine. Electronically Signed   By: Deatra Robinson M.D.   On: 01/29/2020 03:58   MR BRAIN WO CONTRAST  Result Date: 01/30/2020 CLINICAL DATA:  Altered mental status, unclear cause. EXAM: MRI HEAD WITHOUT CONTRAST TECHNIQUE: Multiplanar, multiecho pulse sequences of the brain and surrounding structures were obtained without intravenous contrast. COMPARISON:  Head CT 01/29/2020, brain MRI 02/14/2019 FINDINGS: Brain: There is no evidence of acute infarct. No evidence of intracranial mass. No midline shift or extra-axial fluid collection. No chronic intracranial blood products. No focal parenchymal signal abnormality is demonstrated. Cerebral volume is normal for age. Vascular: Flow voids maintained within the proximal large arterial vessels. Skull and upper cervical spine: No focal marrow lesion. T1 hypointense marrow signal within the visualized upper cervical spine. Sinuses/Orbits: Visualized orbits demonstrate no acute abnormality. Mild paranasal sinus mucosal thickening greatest within bilateral ethmoid air cells and within the left sphenoid sinus. No significant mastoid effusion. IMPRESSION: Unremarkable non-contrast MRI appearance of the brain. No evidence of acute intracranial abnormality. Mild paranasal sinus mucosal thickening. T1 hypointense  marrow signal within the visualized upper cervical spine. While this finding may be seen in the setting of a marrow infiltrative process, the most common causes include chronic anemia, smoking and obesity. Electronically Signed   By: Jackey Loge DO   On: 01/30/2020 18:56   CT ABDOMEN PELVIS W CONTRAST  Result Date: 01/31/2020 CLINICAL DATA:  Abdominal distension. Nausea and vomiting. EXAM: CT ABDOMEN AND PELVIS WITH CONTRAST TECHNIQUE: Multidetector CT imaging of the abdomen and pelvis was performed using the standard protocol following bolus administration of intravenous contrast. CONTRAST:  OMNIPAQUE IOHEXOL 300 MG/ML  SOLN COMPARISON:  None. FINDINGS: Lower chest: No acute abnormality. Hepatobiliary: Scattered hypodense areas within the liver, the most masslike appreciated at the posterior-inferior margin of the RIGHT liver lobe measuring 8.4 cm, most likely atypical geographic fatty infiltration but neoplastic or infectious process cannot be entirely excluded. Gallbladder appears normal. No bile duct dilatation. Pancreas: Unremarkable. No pancreatic ductal dilatation or surrounding inflammatory changes. Spleen: Normal in size without focal abnormality. Adrenals/Urinary Tract: Adrenal glands appear normal. Kidneys are unremarkable without mass, stone or hydronephrosis. No perinephric fluid. No ureteral or bladder calculi. Bladder is unremarkable, partially decompressed. Stomach/Bowel: No dilated large or small bowel loops. No evidence of bowel wall inflammation. Appendix is normal. Stomach is unremarkable, partially decompressed. Vascular/Lymphatic: No significant vascular findings are present. No enlarged abdominal or pelvic lymph nodes. Reproductive: Probable uterine fibroids. No adnexal mass or free fluid seen. Other: No free fluid or abscess collection. No free intraperitoneal air. Musculoskeletal: No acute or suspicious osseous finding. IMPRESSION: 1. Scattered hypodense areas within the liver,  the most masslike appreciated at the posterior-inferior margin of the RIGHT liver lobe measuring 8.4 cm, most likely atypical geographic fatty infiltration but neoplastic or infectious process cannot be entirely excluded. Recommend nonemergent liver MRI with contrast for further characterization. When the patient is clinically stable and able to follow directions and hold their breath (preferably as an outpatient) further evaluation with dedicated abdominal MRI should be considered. 2. Remainder of the abdomen and pelvis CT is unremarkable. No bowel obstruction or evidence of bowel wall inflammation. No free fluid or abscess collection. No evidence of acute solid organ abnormality. No renal or ureteral calculi. Appendix is normal. Electronically Signed   By: Bary Richard M.D.   On: 01/31/2020 17:06   DG Chest Portable 1 View  Result Date: 01/29/2020 CLINICAL DATA:  Altered mental status EXAM: PORTABLE CHEST 1  VIEW COMPARISON:  02/07/2019 FINDINGS: Cardiomegaly accentuated by portable technique. Minimal streaky density at the right base, likely atelectasis. Negative aortic and hilar contours for technique. No effusion or pneumothorax. IMPRESSION: Cardiomegaly without failure. Probable mild atelectasis on the right. Electronically Signed   By: Marnee Spring M.D.   On: 01/29/2020 04:46   DG Abd Portable 1V  Result Date: 01/31/2020 CLINICAL DATA:  Nausea and vomiting. EXAM: PORTABLE ABDOMEN - 1 VIEW COMPARISON:  Radiograph 01/29/2020. FINDINGS: No evidence of free air on supine views. No bowel dilatation to suggest obstruction. No evidence of radiopaque calculi, sensitivity diminished by soft tissue attenuation from habitus. No other interval change. IMPRESSION: Nonobstructive bowel gas pattern. Electronically Signed   By: Narda Rutherford M.D.   On: 01/31/2020 15:07   EEG adult  Result Date: 01/30/2020 Charlsie Quest, MD     01/30/2020 11:16 AM Patient Name: FALESHA SCHOMMER MRN: [id number] Epilepsy  Attending: Charlsie Quest Referring Physician/Provider: Dr. Susa Raring Date: 01/30/2020 Duration: 23.51 mins Patient history: 44 year old female with history of diabetes, polysubstance abuse, seizures presented with altered mental status.  EEG evaluate for seizures. Level of alertness: Awake AEDs during EEG study: None Technical aspects: This EEG study was done with scalp electrodes positioned according to the 10-20 International system of electrode placement. Electrical activity was acquired at a sampling rate of  and reviewed with a high frequency filter of  and a low frequency filter of . EEG data were recorded continuously and digitally stored. Description: Although difficult to appreciate due to significant electrode artifact throughout the EEG, the posterior dominant rhythm of 9 Hz activity of moderate voltage (25-35 uV) seen predominantly in posterior head regions, symmetric was noted.  Hyperventilation and photic stimulation were not performed. Of note, EEG was technically difficult due to eyelid flutter artifact throughout the study. IMPRESSION: This technically difficult study is within normal limits. No seizures or epileptiform discharges were seen throughout the recording. Priyanka Annabelle Harman    Time Spent in minutes  30   Susa Raring M.D on 02/02/2020 at 10:44 AM  To page go to [email or web address] - password [facility]

## 2020-02-02 NOTE — Evaluation (Signed)
Occupational Therapy Evaluation Patient Details Name: Jill Shaw MRN: [id number] DOB: [birth year] Today's Date: 02/02/2020    History of Present Illness Pt is a 44 y/o female admitted secondary to AMS and hypotension. Found to have elevated ethanol level. CT negative for acute abnormality. PMH includes COPD, DM, CKD, obesity, HTN, seizures, and sickle cell trait.    Clinical Impression   Pt PTA: Pt living with family and minimal assist for back and LB ADL tasks; ambulating without physical assist at home. Pt modified independence with ADL and mobility at this time managing own IV pole. Pt performing IADL of stripping bed and making bed. Pt would greatly appreciate tub shower bench or tub shower chair. pP's medicaid pending at this time. Pt does not require continued OT skilled services due to patient at functional baseline for BADL and mobility in room. OT signing off.     Follow Up Recommendations  No OT follow up    Equipment Recommendations  Tub/shower bench;Tub/shower seat    Recommendations for Other Services       Precautions / Restrictions Precautions Precautions: None Restrictions Weight Bearing Restrictions: No      Mobility Bed Mobility Overal bed mobility: Modified Independent                Transfers Overall transfer level: Modified independent Equipment used: None Transfers: Sit to/from [employer] Sit to Stand: Modified independent (Device/Increase time) Stand pivot transfers: Modified independent (Device/Increase time)       General transfer comment: no physical assist provided; pt managing IV pole    Balance Overall balance assessment: No apparent balance deficits (not formally assessed)                                         ADL either performed or assessed with clinical judgement   ADL Overall ADL's : At baseline                                       General ADL Comments: Pt able to  reach for calves, but unable to reach feet stating that she usually wear slip ons and pt's sons provide assist for LB ADL. Pt encouraged to buy LH sponge for BADL to wash back instead of assist from her mother. Pt performing ADL tasks at sink for toilet hygiene and grooming; pt performing IADL of stripping bed and making bed. Pt would greatly appreciate tub shower bench or tub shower chair. pt's medicaid pending at this time.     Vision Baseline Vision/History: Wears glasses Wears Glasses: Reading only Patient Visual Report: No change from baseline Vision Assessment?: No apparent visual deficits     Perception     Praxis      Pertinent Vitals/Pain Pain Assessment: Faces Faces Pain Scale: Hurts a little bit Pain Location: Sore shoulders and back Pain Descriptors / Indicators: Discomfort;Sore Pain Intervention(s): Monitored during session;Repositioned     Hand Dominance     Extremity/Trunk Assessment Upper Extremity Assessment Upper Extremity Assessment: Overall WFL for tasks assessed;RUE deficits/detail;LUE deficits/detail RUE Deficits / Details: ROM FF 150*, soreness with above shoulder movements LUE Deficits / Details: ROM FF 150*, soreness with above shoulder movements LUE Coordination: decreased gross motor   Lower Extremity Assessment Lower Extremity Assessment: Defer to PT evaluation   Cervical / Trunk  Assessment Cervical / Trunk Assessment: Other exceptions Cervical / Trunk Exceptions: large body habitus   Communication Communication Communication: No difficulties   Cognition Arousal/Alertness: Awake/alert Behavior During Therapy: WFL for tasks assessed/performed Overall Cognitive Status: Within Functional Limits for tasks assessed                                 General Comments: Pt alert and oriented X4.    General Comments  VSS on RA.    Exercises     Shoulder Instructions      Home Living Family/patient expects to be discharged to::  Private residence Living Arrangements: Children;Parent Available Help at Discharge: Family;Available 24 hours/day Type of Home: Apartment Home Access: Stairs to enter Entrance Stairs-Number of Steps: flight Entrance Stairs-Rails: Right Home Layout: One level     Bathroom Shower/Tub: Teacher, early years/pre: Standard     Home Equipment: Environmental consultant - 2 wheels;Cane - single point          Prior Functioning/Environment Level of Independence: Independent with assistive device(s)        Comments: Reports using RW vs cane depending on the day.         OT Problem List: Decreased activity tolerance      OT Treatment/Interventions:      OT Goals(Current goals can be found in the care plan section) Acute Rehab OT Goals Patient Stated Goal: to feel better  OT Frequency:     Barriers to D/C:            Co-evaluation              AM-PAC OT "6 Clicks" Daily Activity     Outcome Measure Help from another person eating meals?: None Help from another person taking care of personal grooming?: None Help from another person toileting, which includes using toliet, bedpan, or urinal?: None Help from another person bathing (including washing, rinsing, drying)?: A Little Help from another person to put on and taking off regular upper body clothing?: None Help from another person to put on and taking off regular lower body clothing?: A Little 6 Click Score: 22   End of Session Nurse Communication: Mobility status  Activity Tolerance: Patient tolerated treatment well Patient left: in chair  OT Visit Diagnosis: Unsteadiness on feet (R26.81)                Time: 4782-9562 OT Time Calculation (min): 30 min Charges:  OT General Charges $OT Visit: 1 Visit OT Evaluation $OT Eval Moderate Complexity: 1 Mod OT Treatments $Self Care/Home Management : 8-22 mins  Jefferey Pica, OTR/L Acute Rehabilitation Services Pager: [phone] Office: [phone]   Jill Shaw  C 02/02/2020, 5:03 PM

## 2020-02-02 NOTE — Progress Notes (Signed)
Physical Therapy Treatment Patient Details Name: Jill Shaw MRN: [id number] DOB: August 14, 1976 Today's Date: 02/02/2020    History of Present Illness Pt is a 44 y/o female admitted secondary to AMS and hypotension. Found to have elevated ethanol level. CT negative for acute abnormality. PMH includes COPD, DM, CKD, obesity, HTN, seizures, and sickle cell trait.     PT Comments    Pt feeling much better. Able to amb in hallway with walker only requiring supervision for lines.    Follow Up Recommendations  Supervision - Intermittent     Equipment Recommendations  None recommended by PT    Recommendations for Other Services       Precautions / Restrictions Precautions Precautions: Fall Restrictions Weight Bearing Restrictions: No    Mobility  Bed Mobility Overal bed mobility: Modified Independent                Transfers Overall transfer level: Modified independent Equipment used: None Transfers: Sit to/from [employer] Sit to Stand: Modified independent (Device/Increase time) Stand pivot transfers: Modified independent (Device/Increase time)       General transfer comment: Steady transfers to stand and bed to bsc  Ambulation/Gait Ambulation/Gait assistance: Supervision Gait Distance (Feet): 150 Feet Assistive device: Rolling walker (2 wheeled) Gait Pattern/deviations: Step-through pattern;Wide base of support Gait velocity: decr Gait velocity interpretation: 1.31 - 2.62 ft/sec, indicative of limited community ambulator General Gait Details: Supervision for lines and safety   Stairs             Wheelchair Mobility    Modified Rankin (Stroke Patients Only)       Balance Overall balance assessment: Mild deficits observed, not formally tested                                          Cognition Arousal/Alertness: Awake/alert Behavior During Therapy: WFL for tasks assessed/performed Overall Cognitive Status:  Within Functional Limits for tasks assessed                                        Exercises      General Comments        Pertinent Vitals/Pain Pain Assessment: No/denies pain    Home Living                      Prior Function            PT Goals (current goals can now be found in the care plan section) Progress towards PT goals: Goals met and updated - see care plan;Progressing toward goals    Frequency    Min 3X/week      PT Plan Current plan remains appropriate    Co-evaluation              AM-PAC PT "6 Clicks" Mobility   Outcome Measure  Help needed turning from your back to your side while in a flat bed without using bedrails?: None Help needed moving from lying on your back to sitting on the side of a flat bed without using bedrails?: None Help needed moving to and from a bed to a chair (including a wheelchair)?: None Help needed standing up from a chair using your arms (e.g., wheelchair or bedside chair)?: None Help needed to walk in hospital room?: None  Help needed climbing 3-5 steps with a railing? : A Little 6 Click Score: 23    End of Session Equipment Utilized During Treatment: Gait belt Activity Tolerance: Patient tolerated treatment well Patient left: in bed;with call bell/phone within reach(sitting EOB)   PT Visit Diagnosis: Difficulty in walking, not elsewhere classified (R26.2)     Time: 8891-6945 PT Time Calculation (min) (ACUTE ONLY): 11 min  Charges:  $Gait Training: 8-22 mins                     [place] Pager [phone] Office [place] 02/02/2020, 10:47 AM

## 2020-02-03 LAB — CBC WITH DIFFERENTIAL/PLATELET
Abs Immature Granulocytes: 0.11 10*3/uL — ABNORMAL HIGH (ref 0.00–0.07)
Basophils Absolute: 0.1 10*3/uL (ref 0.0–0.1)
Basophils Relative: 1 %
Eosinophils Absolute: 0.6 10*3/uL — ABNORMAL HIGH (ref 0.0–0.5)
Eosinophils Relative: 6 %
HCT: 30.1 % — ABNORMAL LOW (ref 36.0–46.0)
Hemoglobin: 9.3 g/dL — ABNORMAL LOW (ref 12.0–15.0)
Immature Granulocytes: 1 %
Lymphocytes Relative: 24 %
Lymphs Abs: 2.3 10*3/uL (ref 0.7–4.0)
MCH: 25 pg — ABNORMAL LOW (ref 26.0–34.0)
MCHC: 30.9 g/dL (ref 30.0–36.0)
MCV: 80.9 fL (ref 80.0–100.0)
Monocytes Absolute: 0.6 10*3/uL (ref 0.1–1.0)
Monocytes Relative: 6 %
Neutro Abs: 6 10*3/uL (ref 1.7–7.7)
Neutrophils Relative %: 62 %
Platelets: 229 10*3/uL (ref 150–400)
RBC: 3.72 MIL/uL — ABNORMAL LOW (ref 3.87–5.11)
RDW: 24 % — ABNORMAL HIGH (ref 11.5–15.5)
WBC: 9.7 10*3/uL (ref 4.0–10.5)
nRBC: 0.2 % (ref 0.0–0.2)

## 2020-02-03 LAB — CULTURE, BLOOD (ROUTINE X 2)
Culture: NO GROWTH
Culture: NO GROWTH
Special Requests: ADEQUATE
Special Requests: ADEQUATE

## 2020-02-03 LAB — COMPREHENSIVE METABOLIC PANEL
ALT: 16 U/L (ref 0–44)
AST: 31 U/L (ref 15–41)
Albumin: 2.7 g/dL — ABNORMAL LOW (ref 3.5–5.0)
Alkaline Phosphatase: 52 U/L (ref 38–126)
Anion gap: 10 (ref 5–15)
BUN: 10 mg/dL (ref 6–20)
CO2: 25 mmol/L (ref 22–32)
Calcium: 8.9 mg/dL (ref 8.9–10.3)
Chloride: 106 mmol/L (ref 98–111)
Creatinine, Ser: 1.28 mg/dL — ABNORMAL HIGH (ref 0.44–1.00)
GFR calc Af Amer: 59 mL/min — ABNORMAL LOW (ref >60)
GFR calc non Af Amer: 51 mL/min — ABNORMAL LOW (ref >60)
Glucose, Bld: 170 mg/dL — ABNORMAL HIGH (ref 70–99)
Potassium: 3.6 mmol/L (ref 3.5–5.1)
Sodium: 141 mmol/L (ref 135–145)
Total Bilirubin: 0.5 mg/dL (ref 0.3–1.2)
Total Protein: 5.9 g/dL — ABNORMAL LOW (ref 6.5–8.1)

## 2020-02-03 LAB — GLUCOSE, CAPILLARY: Glucose-Capillary: 222 mg/dL — ABNORMAL HIGH (ref 70–99)

## 2020-02-03 LAB — BRAIN NATRIURETIC PEPTIDE: B Natriuretic Peptide: 125.5 pg/mL — ABNORMAL HIGH (ref 0.0–100.0)

## 2020-02-03 LAB — MAGNESIUM: Magnesium: 1.9 mg/dL (ref 1.7–2.4)

## 2020-02-03 MED ORDER — TIZANIDINE HCL 6 MG PO CAPS: 6.0000 mg | ORAL_CAPSULE | Freq: Two times a day (BID) | ORAL | 0 refills | Status: DC

## 2020-02-03 MED ORDER — HYDRALAZINE HCL 50 MG PO TABS: 50.0000 mg | ORAL_TABLET | Freq: Three times a day (TID) | ORAL | 0 refills | Status: DC

## 2020-02-03 MED ORDER — FUROSEMIDE 40 MG PO TABS: 40.0000 mg | ORAL_TABLET | Freq: Every day | ORAL | 0 refills | Status: DC

## 2020-02-03 MED ORDER — LOSARTAN POTASSIUM 50 MG PO TABS: 50.0000 mg | ORAL_TABLET | Freq: Every day | ORAL | 0 refills | Status: DC

## 2020-02-03 MED ORDER — FUROSEMIDE 80 MG PO TABS: 80.0000 mg | ORAL_TABLET | Freq: Every day | ORAL | 3 refills | Status: DC

## 2020-02-03 MED ORDER — POTASSIUM CHLORIDE CRYS ER 20 MEQ PO TBCR: 20.0000 meq | EXTENDED_RELEASE_TABLET | Freq: Every day | ORAL | 3 refills | Status: DC

## 2020-02-03 MED ORDER — LOSARTAN POTASSIUM 100 MG PO TABS: 100.0000 mg | ORAL_TABLET | Freq: Every day | ORAL | 3 refills | Status: DC

## 2020-02-03 MED FILL — LOSARTAN POTASSIUM 50 MG TA: 50 | 30 days supply | Qty: 30 | Fill #0

## 2020-02-03 MED FILL — hydrALAZINE HCL 50 MG TABS: 50 | 30 days supply | Qty: 90 | Fill #0

## 2020-02-03 NOTE — Discharge Summary (Signed)
Jill Shaw PNT:[id number] DOB: 1975/12/07 DOA: 01/29/2020  PCP: Vevelyn Francois, NP  Admit date: 01/29/2020  Discharge date: 02/03/2020  Admitted From: Home   Disposition:  Home   Recommendations for Outpatient Follow-up:   Follow up with PCP in 1-2 weeks  PCP Please obtain BMP/CBC, 2 view CXR in 1week,  (see Discharge instructions)   PCP Please follow up on the following pending results: Blood pressure, BMP, slowly taper off her muscle relaxant, needs outpatient liver MRI.   Home Health: None Equipment/Devices: None  Consultations: OB Discharge Condition: Stable    CODE STATUS: Full    Diet Recommendation: Heart Healthy Low Carb  Diet Order            Diet Carb Modified Fluid consistency: Thin; Room service appropriate? Yes  Diet effective now               Chief Complaint  Patient presents with   Altered Mental Status     Brief history of present illness from the day of admission and additional interim summary    Jill Chargois Johnsonis a 44 y.o.femalewith medical history significant ofmorbid obesity; tobacco dependence; seizures; HTN; DM; and COPD presenting with AMS.Her mother said that she passed out, was unconscious last night, apparently she has had a few falls over the last few days, has chronic right lower extremity weakness which he attributes to arthritis and neuropathy.  Currently no headache or new focal weakness, apparently patient was due for outpatient MRI as well.  In the ER her work-up for falls suggested AKI and she was admitted to the hospital.                                                                 Hospital Course    1.  Metabolic encephalopathy associated with multiple falls & AKI - she likely has some element of dehydration along could have been worse due to  underlying use of narcotics and muscle relaxers.    With supportive care she is back to her baseline, CT head and C-spine unremarkable, EEG unremarkable, MRI non acute.  He was treated with IV fluids for hydration, her diuretics and ARB were held, muscle relaxer was held and she went into withdrawal from that, it was reintroduced at a lower dose, she is now at her baseline and symptom-free will be discharged with outpatient PCP follow-up.  2. AKI with hypokalemia.  Due to dehydration resolved after IV fluids, ACE inhibitors and NSAIDs held.  She is now close to her baseline renal function is improved, I have cut down her Lasix and ARB dosing to have to be started over the next few days as below, PCP to monitor blood pressure and BMP closely along with diuretic dose and ARB dose.  3.  Chronic pain.  Supportive care.  Minimize narcotics and benzo use.  4.  COPD .  Supportive care no wheezing.  5.  Essential hypertension.  Blood pressure was low upon admission, blood pressure medications have been adjusted.  PCP to monitor.  6.  Morbid obesity with BMI of 46.  Follow with PCP for weight loss.  7.  Smoking.  Counseled to quit.  8.  History of seizures in the remote past.  States this was in the setting of hypoglycemia, question if they were true seizures, EEG unremarkable.  9.  Suprapubic abdominal pain, persistent nausea, unclear etiology -  she is passing flatus and having bowel movements and abdominal exam appears to be benign, CT abdomen pelvis nonacute with some chronic liver findings for which she requires outpatient liver MRI, UA is borderline hence will give her a trial of Rocephin for 3 days starting 02/01/2020, he was seen and cleared by [facility] service this admission as well.  Of note patient was restarted on her home dose Zanaflex with remarkable improvement in her symptoms, this could have been withdrawal from Zanaflex, counseled to gradually taper it off, PCP to kindly help her with it.   Urine GC chlamydia and trichomonas requested by [facility] service were all negative.  10.  DM type II.  Due to poor oral intake her 75/25 insulin was cut down, will advance diet and increase her insulin, continue sliding scale monitor and adjust.   Discharge diagnosis     Principal Problem:   Acute encephalopathy Active Problems:   Type 2 diabetes mellitus without complication, with long-term current use of insulin (HCC)   Essential hypertension   Morbid obesity (HCC)   Tobacco dependence   Seizure (HCC)   Chronic obstructive pulmonary disease (HCC)   Chronic pain   AKI (acute kidney injury) [facility])    Discharge instructions    Discharge Instructions    Amb Referral to Nutrition and Diabetic E   Complete by: As directed    Discharge instructions   Complete by: As directed    Follow with Primary MD Vevelyn Francois, NP in 7 days, you need a dedicated liver MRI in 2 to 3 weeks, request your primary care physician to arrange for it.  Get CBC, CMP, 2 view Chest X ray -  checked next visit within 1 week by Primary MD    Activity: As tolerated with Full fall precautions use walker/cane & assistance as needed  Disposition Home    Diet: Heart Healthy  Low Carb  Accuchecks 4 times/day, Once in AM empty stomach and then before each meal. Log in all results and show them to your Prim.MD in 3 days. If any glucose reading is under 80 or above 300 call your Prim MD immidiately. Follow Low glucose instructions for glucose under 80 as instructed.   Special Instructions: If you have smoked or chewed Tobacco  in the last 2 yrs please stop smoking, stop any regular Alcohol  and or any Recreational drug use.  On your next visit with your primary care physician please Get Medicines reviewed and adjusted.  Please request your Prim.MD to go over all Hospital Tests and Procedure/Radiological results at the follow up, please get all Hospital records sent to your Prim MD by signing hospital release  before you go home.  If you experience worsening of your admission symptoms, develop shortness of breath, life threatening emergency, suicidal or homicidal thoughts you must seek medical attention immediately by calling 911 or calling your MD immediately  if symptoms less severe.  You Must read complete instructions/literature along with all the possible adverse reactions/side effects for all the Medicines you take and that have been prescribed to you. Take any new Medicines after you have completely understood and accpet all the possible adverse reactions/side effects.   Do not drive, operate heavy machinery, perform activities at heights, swimming or participation in water activities or provide baby sitting services if your were admitted for syncope or siezures until you have seen by Primary MD or a Neurologist and advised to do so again.  Do not drive when taking Pain medications.  Do not take more than prescribed Pain, Sleep and Anxiety Medications   Increase activity slowly   Complete by: As directed       Discharge Medications   Allergies as of 02/03/2020      Reactions   Ketoprofen Nausea And Vomiting   Aspirin Nausea Only   Gabapentin Nausea And Vomiting   upset stomach   Ibuprofen Nausea And Vomiting   Liraglutide Nausea And Vomiting   Naproxen Nausea And Vomiting   Omeprazole-sodium Bicarbonate Nausea And Vomiting   Sulfa Antibiotics Nausea And Vomiting   Tramadol Nausea And Vomiting   stomach upset      Medication List    STOP taking these medications   HYDROcodone-acetaminophen 7.5-325 MG tablet Commonly known as: NORCO     TAKE these medications   albuterol (2.5 MG/3ML) 0.083% nebulizer solution Commonly known as: PROVENTIL Take 3 mLs (2.5 mg total) by nebulization every 6 (six) hours as needed for wheezing or shortness of breath.   albuterol 108 (90 Base) MCG/ACT inhaler Commonly known as: VENTOLIN HFA Inhale 2 puffs into the lungs every 6 (six) hours as  needed for wheezing or shortness of breath.   budesonide-formoterol 160-4.5 MCG/ACT inhaler Commonly known as: Symbicort INHALE 2 PUFFS INTO THE LUNGS 2 (TWO) TIMES DAILY. What changed:   how much to take  how to take this  when to take this  additional instructions   cetirizine 10 MG tablet Commonly known as: ZYRTEC Take 1 tablet (10 mg total) by mouth daily.   ferrous sulfate 325 (65 FE) MG tablet Take 1 tablet (325 mg total) by mouth 3 (three) times daily with meals.   fluticasone 50 MCG/ACT nasal spray Commonly known as: FLONASE Place 2 sprays into both nostrils daily.   furosemide 40 MG tablet Commonly known as: LASIX Take 1 tablet (40 mg total) by mouth daily. Start taking on: February 04, 2020 What changed:   medication strength  how much to take   Glucosamine Sulfate 1000 MG Caps Take 1 capsule (1,000 mg total) by mouth 2 (two) times daily.   glucose blood test strip Commonly known as: True Metrix Blood Glucose Test Use as instructed   hydrALAZINE 50 MG tablet Commonly known as: APRESOLINE Take 1 tablet (50 mg total) by mouth 3 (three) times daily.   hydrocortisone 2.5 % cream Apply topically 2 (two) times daily.   Insulin Lispro Prot & Lispro (75-25) 100 UNIT/ML Kwikpen Commonly known as: HumaLOG Mix 75/25 KwikPen INJECT 100 UNITS EVERY 12 HOURS What changed:   how much to take  how to take this  when to take this  additional instructions   Lido-Capsaicin-Men-Methyl Sal 0.5-0.035-5-20 % Ptch Apply 1 patch topically daily.   Lidocaine-Capsaicin 4-0.1 % Crea Apply 1 application topically 4 (four) times daily as needed.   losartan 50 MG tablet Commonly known as: COZAAR Take 1 tablet (50 mg total)  by mouth daily. Start taking on: February 06, 2020 What changed:   medication strength  how much to take  These instructions start on February 06, 2020. If you are unsure what to do until then, ask your doctor or other care provider.   metoprolol  succinate 100 MG 24 hr tablet Commonly known as: TOPROL-XL Take 1 tablet (100 mg total) by mouth daily. Take with or immediately following a meal.   norethindrone 5 MG tablet Commonly known as: AYGESTIN Take 2 tablets (10 mg total) by mouth every 8 (eight) hours.   omeprazole 40 MG capsule Commonly known as: PRILOSEC Take 1 capsule (40 mg total) by mouth daily.   Onglyza 2.5 MG Tabs tablet Generic drug: saxagliptin HCl Take 1 tablet (2.5 mg total) by mouth daily.   Pen Needles 30G X 5 MM Misc 1 Units by Does not apply route as directed.   potassium chloride SA 20 MEQ tablet Commonly known as: KLOR-CON Take 1 tablet (20 mEq total) by mouth daily. Start taking on: February 04, 2020   tizanidine 6 MG capsule Commonly known as: ZANAFLEX Take 1 capsule (6 mg total) by mouth in the morning and at bedtime for 1 day. What changed: when to take this   True Metrix Meter w/Device Kit 1 each by Does not apply route 4 (four) times daily -  before meals and at bedtime.   Turmeric 500 MG Caps Take 500 mg by mouth 2 (two) times daily.   Vitamin D (Ergocalciferol) 1.25 MG (50000 UNIT) Caps capsule Commonly known as: DRISDOL Take 1 capsule (50,000 Units total) by mouth every 7 (seven) days. What changed: additional instructions   Vitamin D-3 125 MCG (5000 UT) Tabs Take 1 tablet by mouth daily. What changed: how much to take       Follow-up Information    Vevelyn Francois, NP. Schedule an appointment as soon as possible for a visit in 1 week(s).   Specialty: Adult Health Nurse Practitioner Contact information: [address] [place] [address] [phone]        Jackquline Denmark, MD. Schedule an appointment as soon as possible for a visit in 1 week(s).   Specialties: Gastroenterology, Internal Medicine Why: Fatty liver, may require liver MRI. Contact information: [address] [place] [address] [phone]           Major procedures  and Radiology Reports - PLEASE review detailed and final reports thoroughly  -       DG Abdomen 1 View  Result Date: 01/29/2020 CLINICAL DATA:  Sickle cell.  Altered mental status EXAM: ABDOMEN - 1 VIEW COMPARISON:  None. FINDINGS: The nonspecific bowel gas pattern without evidence of obstruction. Air is seen within large and small bowel loops throughout the abdomen. No radio-opaque calculi or other significant radiographic abnormality are seen. No acute osseous findings. IMPRESSION: Nonspecific bowel gas pattern. Electronically Signed   By: Davina Poke D.O.   On: 01/29/2020 13:34   CT Head Wo Contrast  Result Date: 01/29/2020 CLINICAL DATA:  . Found unresponsive. Recent fall. EXAM: CT HEAD WITHOUT CONTRAST CT CERVICAL SPINE WITHOUT CONTRAST TECHNIQUE: Multidetector CT imaging of the head and cervical spine was performed following the standard protocol without intravenous contrast. Multiplanar CT image reconstructions of the cervical spine were also generated. COMPARISON:  None. FINDINGS: CT HEAD FINDINGS Brain: There is no mass, hemorrhage or extra-axial collection. The size and configuration of the ventricles and extra-axial CSF spaces are normal. The  brain parenchyma is normal, without evidence of acute or chronic infarction. Vascular: No abnormal hyperdensity of the major intracranial arteries or dural venous sinuses. No intracranial atherosclerosis. Skull: The visualized skull base, calvarium and extracranial soft tissues are normal. Sinuses/Orbits: No fluid levels or advanced mucosal thickening of the visualized paranasal sinuses. No mastoid or middle ear effusion. The orbits are normal. CT CERVICAL SPINE FINDINGS Alignment: No static subluxation. Facets are aligned. Occipital condyles are normally positioned. Skull base and vertebrae: No acute fracture. Soft tissues and spinal canal: No prevertebral fluid or swelling. No visible canal hematoma. Disc levels: No advanced spinal canal or neural  foraminal stenosis. Upper chest: No pneumothorax, pulmonary nodule or pleural effusion. Other: Normal visualized paraspinal cervical soft tissues. IMPRESSION: 1. No acute intracranial abnormality. 2. No acute fracture or static subluxation of the cervical spine. Electronically Signed   By: Ulyses Jarred M.D.   On: 01/29/2020 03:58   CT Cervical Spine Wo Contrast  Result Date: 01/29/2020 CLINICAL DATA:  . Found unresponsive. Recent fall. EXAM: CT HEAD WITHOUT CONTRAST CT CERVICAL SPINE WITHOUT CONTRAST TECHNIQUE: Multidetector CT imaging of the head and cervical spine was performed following the standard protocol without intravenous contrast. Multiplanar CT image reconstructions of the cervical spine were also generated. COMPARISON:  None. FINDINGS: CT HEAD FINDINGS Brain: There is no mass, hemorrhage or extra-axial collection. The size and configuration of the ventricles and extra-axial CSF spaces are normal. The brain parenchyma is normal, without evidence of acute or chronic infarction. Vascular: No abnormal hyperdensity of the major intracranial arteries or dural venous sinuses. No intracranial atherosclerosis. Skull: The visualized skull base, calvarium and extracranial soft tissues are normal. Sinuses/Orbits: No fluid levels or advanced mucosal thickening of the visualized paranasal sinuses. No mastoid or middle ear effusion. The orbits are normal. CT CERVICAL SPINE FINDINGS Alignment: No static subluxation. Facets are aligned. Occipital condyles are normally positioned. Skull base and vertebrae: No acute fracture. Soft tissues and spinal canal: No prevertebral fluid or swelling. No visible canal hematoma. Disc levels: No advanced spinal canal or neural foraminal stenosis. Upper chest: No pneumothorax, pulmonary nodule or pleural effusion. Other: Normal visualized paraspinal cervical soft tissues. IMPRESSION: 1. No acute intracranial abnormality. 2. No acute fracture or static subluxation of the cervical  spine. Electronically Signed   By: Ulyses Jarred M.D.   On: 01/29/2020 03:58   MR BRAIN WO CONTRAST  Result Date: 01/30/2020 CLINICAL DATA:  Altered mental status, unclear cause. EXAM: MRI HEAD WITHOUT CONTRAST TECHNIQUE: Multiplanar, multiecho pulse sequences of the brain and surrounding structures were obtained without intravenous contrast. COMPARISON:  Head CT 01/29/2020, brain MRI 02/14/2019 FINDINGS: Brain: There is no evidence of acute infarct. No evidence of intracranial mass. No midline shift or extra-axial fluid collection. No chronic intracranial blood products. No focal parenchymal signal abnormality is demonstrated. Cerebral volume is normal for age. Vascular: Flow voids maintained within the proximal large arterial vessels. Skull and upper cervical spine: No focal marrow lesion. T1 hypointense marrow signal within the visualized upper cervical spine. Sinuses/Orbits: Visualized orbits demonstrate no acute abnormality. Mild paranasal sinus mucosal thickening greatest within bilateral ethmoid air cells and within the left sphenoid sinus. No significant mastoid effusion. IMPRESSION: Unremarkable non-contrast MRI appearance of the brain. No evidence of acute intracranial abnormality. Mild paranasal sinus mucosal thickening. T1 hypointense marrow signal within the visualized upper cervical spine. While this finding may be seen in the setting of a marrow infiltrative process, the most common causes include chronic anemia, smoking and obesity. Electronically  Signed   By: Kellie Simmering DO   On: 01/30/2020 18:56   CT ABDOMEN PELVIS W CONTRAST  Result Date: 01/31/2020 CLINICAL DATA:  Abdominal distension. Nausea and vomiting. EXAM: CT ABDOMEN AND PELVIS WITH CONTRAST TECHNIQUE: Multidetector CT imaging of the abdomen and pelvis was performed using the standard protocol following bolus administration of intravenous contrast. CONTRAST:  157m OMNIPAQUE IOHEXOL 300 MG/ML  SOLN COMPARISON:  None. FINDINGS:  Lower chest: No acute abnormality. Hepatobiliary: Scattered hypodense areas within the liver, the most masslike appreciated at the posterior-inferior margin of the RIGHT liver lobe measuring 8.4 cm, most likely atypical geographic fatty infiltration but neoplastic or infectious process cannot be entirely excluded. Gallbladder appears normal. No bile duct dilatation. Pancreas: Unremarkable. No pancreatic ductal dilatation or surrounding inflammatory changes. Spleen: Normal in size without focal abnormality. Adrenals/Urinary Tract: Adrenal glands appear normal. Kidneys are unremarkable without mass, stone or hydronephrosis. No perinephric fluid. No ureteral or bladder calculi. Bladder is unremarkable, partially decompressed. Stomach/Bowel: No dilated large or small bowel loops. No evidence of bowel wall inflammation. Appendix is normal. Stomach is unremarkable, partially decompressed. Vascular/Lymphatic: No significant vascular findings are present. No enlarged abdominal or pelvic lymph nodes. Reproductive: Probable uterine fibroids. No adnexal mass or free fluid seen. Other: No free fluid or abscess collection. No free intraperitoneal air. Musculoskeletal: No acute or suspicious osseous finding. IMPRESSION: 1. Scattered hypodense areas within the liver, the most masslike appreciated at the posterior-inferior margin of the RIGHT liver lobe measuring 8.4 cm, most likely atypical geographic fatty infiltration but neoplastic or infectious process cannot be entirely excluded. Recommend nonemergent liver MRI with contrast for further characterization. When the patient is clinically stable and able to follow directions and hold their breath (preferably as an outpatient) further evaluation with dedicated abdominal MRI should be considered. 2. Remainder of the abdomen and pelvis CT is unremarkable. No bowel obstruction or evidence of bowel wall inflammation. No free fluid or abscess collection. No evidence of acute solid  organ abnormality. No renal or ureteral calculi. Appendix is normal. Electronically Signed   By: SFranki CabotM.D.   On: 01/31/2020 17:06   DG Chest Portable 1 View  Result Date: 01/29/2020 CLINICAL DATA:  Altered mental status EXAM: PORTABLE CHEST 1 VIEW COMPARISON:  02/07/2019 FINDINGS: Cardiomegaly accentuated by portable technique. Minimal streaky density at the right base, likely atelectasis. Negative aortic and hilar contours for technique. No effusion or pneumothorax. IMPRESSION: Cardiomegaly without failure. Probable mild atelectasis on the right. Electronically Signed   By: JMonte FantasiaM.D.   On: 01/29/2020 04:46   DG Abd Portable 1V  Result Date: 01/31/2020 CLINICAL DATA:  Nausea and vomiting. EXAM: PORTABLE ABDOMEN - 1 VIEW COMPARISON:  Radiograph 01/29/2020. FINDINGS: No evidence of free air on supine views. No bowel dilatation to suggest obstruction. No evidence of radiopaque calculi, sensitivity diminished by soft tissue attenuation from habitus. No other interval change. IMPRESSION: Nonobstructive bowel gas pattern. Electronically Signed   By: MKeith RakeM.D.   On: 01/31/2020 15:07   EEG adult  Result Date: 01/30/2020 YLora Havens MD     01/30/2020 11:16 AM Patient Name: Jill NORSWORTHYMRN: [id number] Attending: PLora HavensReferring Physician/Provider: Dr. PLala LundDate: 01/30/2020 Duration: 23.51 mins Patient history: 44year old female with history of diabetes, polysubstance abuse, seizures presented with altered mental status.  EEG evaluate for seizures. Level of alertness: Awake AEDs during EEG study: None Technical aspects: This EEG study was done with scalp electrodes positioned according to  the 10-20 International system of electrode placement. Electrical activity was acquired at a sampling rate of '500Hz'$  and reviewed with a high frequency filter of '70Hz'$  and a low frequency filter of '1Hz'$ . EEG data were recorded continuously and digitally stored.  Description: Although difficult to appreciate due to significant electrode artifact throughout the EEG, the posterior dominant rhythm of 9 Hz activity of moderate voltage (25-35 uV) seen predominantly in posterior head regions, symmetric was noted.  Hyperventilation and photic stimulation were not performed. Of note, EEG was technically difficult due to eyelid flutter artifact throughout the study. IMPRESSION: This technically difficult study is within normal limits. No seizures or epileptiform discharges were seen throughout the recording. Jill Shaw    Micro Results     Recent Results (from the past 240 hour(s))  Respiratory Panel by RT PCR (Flu A&B, Covid) - Nasopharyngeal Swab     Status: None   Collection Time: 01/29/20  5:46 AM   Specimen: Nasopharyngeal Swab  Result Value Ref Range Status   SARS Coronavirus 2 by RT PCR NEGATIVE NEGATIVE Final    Comment: (NOTE) SARS-CoV-2 target nucleic acids are NOT DETECTED. The SARS-CoV-2 RNA is generally detectable in upper respiratoy specimens during the acute phase of infection. The lowest concentration of SARS-CoV-2 viral copies this assay can detect is 131 copies/mL. A negative result does not preclude SARS-Cov-2 infection and should not be used as the sole basis for treatment or other patient management decisions. A negative result may occur with  improper specimen collection/handling, submission of specimen other than nasopharyngeal swab, presence of viral mutation(s) within the areas targeted by this assay, and inadequate number of viral copies (<131 copies/mL). A negative result must be combined with clinical observations, patient history, and epidemiological information. The expected result is Negative. Fact Sheet for Patients:  PinkCheek.be Fact Sheet for Healthcare Providers:  GravelBags.it This test is not yet ap proved or cleared by the [place] FDA and  has  been authorized for detection and/or diagnosis of SARS-CoV-2 by FDA under an Emergency Use Authorization (EUA). This EUA will remain  in effect (meaning this test can be used) for the duration of the COVID-19 declaration under Section 564(b)(1) of the Act, 21 U.S.C. section 360bbb-3(b)(1), unless the authorization is terminated or revoked sooner.    Influenza A by PCR NEGATIVE NEGATIVE Final   Influenza B by PCR NEGATIVE NEGATIVE Final    Comment: (NOTE) The Xpert Xpress SARS-CoV-2/FLU/RSV assay is intended as an aid in  the diagnosis of influenza from Nasopharyngeal swab specimens and  should not be used as a sole basis for treatment. Nasal washings and  aspirates are unacceptable for Xpert Xpress SARS-CoV-2/FLU/RSV  testing. Fact Sheet for Patients: PinkCheek.be Fact Sheet for Healthcare Providers: GravelBags.it This test is not yet approved or cleared by the [place] FDA and  has been authorized for detection and/or diagnosis of SARS-CoV-2 by  FDA under an Emergency Use Authorization (EUA). This EUA will remain  in effect (meaning this test can be used) for the duration of the  Covid-19 declaration under Section 564(b)(1) of the Act, 21  U.S.C. section 360bbb-3(b)(1), unless the authorization is  terminated or revoked. Performed at [facility], [place] [address], [place], [place] [address]   Blood culture (routine x 2)     Status: None (Preliminary result)   Collection Time: 01/29/20  8:15 AM   Specimen: BLOOD LEFT HAND  Result Value Ref Range Status   Specimen Description BLOOD LEFT HAND  Final   Special Requests   Final    BOTTLES DRAWN AEROBIC AND ANAEROBIC Blood Culture adequate volume   Culture   Final    NO GROWTH 4 DAYS Performed at [facility], [address] [place], [place] [address]    Report Status PENDING  Incomplete  Blood culture (routine x 2)     Status: None (Preliminary result)    Collection Time: 01/29/20  8:20 AM   Specimen: BLOOD LEFT ARM  Result Value Ref Range Status   Specimen Description BLOOD LEFT ARM  Final   Special Requests   Final    BOTTLES DRAWN AEROBIC AND ANAEROBIC Blood Culture adequate volume   Culture   Final    NO GROWTH 4 DAYS Performed at [facility], [place] [address] [place], [place] [address]    Report Status PENDING  Incomplete  Culture, Urine     Status: None   Collection Time: 02/01/20 12:39 PM   Specimen: Urine, Clean Catch  Result Value Ref Range Status   Specimen Description URINE, CLEAN CATCH  Final   Special Requests NONE  Final   Culture   Final    NO GROWTH Performed at [facility], [place] [address], [place], [place] [address]    Report Status 02/02/2020 FINAL  Final    Today   Subjective    Jill Shaw today has no headache,no chest abdominal pain,no new weakness tingling or numbness, feels much better wants to go home today.    Objective   Blood pressure (!) 154/83, pulse 88, temperature 98.7 F (37.1 C), temperature source Oral, resp. rate 20, height '5\' 4"'$  (1.626 m), weight 122.5 kg, SpO2 98 %.   Intake/Output Summary (Last 24 hours) at 02/03/2020 0955 Last data filed at 02/02/2020 1838 Gross per 24 hour  Intake 340 ml  Output 1 ml  Net 339 ml    Exam Awake Alert,   No new F.N deficits, Normal affect .AT,PERRAL Supple Neck,No JVD, No cervical lymphadenopathy appriciated.  Symmetrical Chest wall movement, Good air movement bilaterally, CTAB RRR,No Gallops,Rubs or new Murmurs, No Parasternal Heave +ve B.Sounds, Abd Soft, Non tender, No organomegaly appriciated, No rebound -guarding or rigidity. No Cyanosis, Clubbing or edema, No new Rash or bruise   Data Review   CBC w Diff:  Lab Results  Component Value Date   WBC 9.7 02/03/2020   HGB 9.3 (L) 02/03/2020   HGB 11.5 12/26/2019   HCT 30.1 (L) 02/03/2020   HCT 38.4 12/26/2019   PLT 229 02/03/2020   PLT 252 12/26/2019   LYMPHOPCT 24  02/03/2020   BANDSPCT 0 02/08/2016   MONOPCT 6 02/03/2020   EOSPCT 6 02/03/2020   BASOPCT 1 02/03/2020    CMP:  Lab Results  Component Value Date   NA 141 02/03/2020   NA 142 12/26/2019   K 3.6 02/03/2020   CL 106 02/03/2020   CO2 25 02/03/2020   BUN 10 02/03/2020   BUN 9 12/26/2019   CREATININE 1.28 (H) 02/03/2020   CREATININE 1.18 (H) 08/31/2017   PROT 5.9 (L) 02/03/2020   PROT 7.0 12/26/2019   ALBUMIN 2.7 (L) 02/03/2020   ALBUMIN 3.8 12/26/2019   BILITOT 0.5 02/03/2020   BILITOT 0.4 12/26/2019   ALKPHOS 52 02/03/2020   AST 31 02/03/2020   ALT 16 02/03/2020  .   Total Time in preparing paper work, data evaluation and todays exam - 21 minutes  Jill Shaw M.D on 02/03/2020 at 9:55  AM  [facility]   Office  [phone]

## 2020-02-03 NOTE — Progress Notes (Signed)
Pt requesting order for shower chair- per pt she is in process of applying for Medicaid and would like order so that she can get chair once she gets Medicaid- MD placed order and printed order provided- explained to pt order may not still be valid by the time she gets Medicaid - and if needed she can get a new order for DME from her Primary care doctor- pt voiced understanding and appreciation.

## 2020-02-03 NOTE — Progress Notes (Signed)
Discharge instructions given to Jill Shaw.  Discussed medication changes, new medications and side effects.  Discussed signs and symptoms to watch for and when to contact the physician.  Discussed follow up appointments and activities.  Verbalized understanding.

## 2020-02-03 NOTE — Plan of Care (Signed)
Patient is adequate for discharge.  

## 2020-02-04 ENCOUNTER — Telehealth (INDEPENDENT_AMBULATORY_CARE_PROVIDER_SITE_OTHER): Payer: No Typology Code available for payment source | Admitting: Obstetrics and Gynecology

## 2020-02-04 DIAGNOSIS — Z5329 Procedure and treatment not carried out because of patient's decision for other reasons: Secondary | ICD-10-CM

## 2020-02-04 DIAGNOSIS — Z91199 Patient's noncompliance with other medical treatment and regimen due to unspecified reason: Secondary | ICD-10-CM

## 2020-02-04 NOTE — Progress Notes (Signed)
Called late at 225pm lvm that I apologize for calling late and will call her back soon

## 2020-02-05 ENCOUNTER — Telehealth: Payer: Self-pay | Admitting: Obstetrics and Gynecology

## 2020-02-05 ENCOUNTER — Ambulatory Visit: Payer: No Typology Code available for payment source | Admitting: Cardiovascular Disease

## 2020-02-05 NOTE — Telephone Encounter (Signed)
Attempted to contact patient to get her rescheduled for her missed appointment. No answer and unable to leave a voicemail due to it being full.

## 2020-02-05 NOTE — Progress Notes (Deleted)
Cardiology Office Note:   Date:  02/05/2020  NAME:  Jill Shaw    MRN: [id number] DOB:  08-28-1976   PCP:  Vevelyn Francois, NP  Cardiologist:  No primary care provider on file.  Electrophysiologist:  None   Referring MD: Vevelyn Francois, NP   No chief complaint on file. ***  History of Present Illness:   Jill Shaw is a 44 y.o. female with a hx of morbid obesity (BMI 88), HTN, DM, COPD who is being seen today for the evaluation of abnormal EKG at the request of Vevelyn Francois, NP.  BNP 125. Hgb 9.3.   Problem List 1. Morbid obesity (BMI 46) 2. Diabetes -A1c 9.5 3. COPD 4. HTN 5. Tobacco abuse  6. Anemia  7. HLD -T chol 151, TG 205, LDL 63, HDL 55  Past Medical History: Past Medical History:  Diagnosis Date  . Anemia   . Arthritis    knees, hands  . Asthma   . COPD (chronic obstructive pulmonary disease) ([facility])   . Diabetes mellitus without complication ([place])    type 2  . Dysfunctional uterine bleeding   . GERD (gastroesophageal reflux disease)   . Hypertension   . Neuromuscular disorder ([facility])    neuropathy feet  . Seizures ([place]) 09/12/2017   pt states r/t stress and blood sugar - no meds last one 4 months ago, not seen neurologist  . Sickle cell trait ([place])   . Smoker   . Vitamin D deficiency 10/2019  . Wears glasses     Past Surgical History: Past Surgical History:  Procedure Laterality Date  . CESAREAN SECTION     x 1  . DILATION AND CURETTAGE OF UTERUS N/A 08/20/2019   Procedure: DILATATION AND CURETTAGE;  Surgeon: Emily Filbert, MD;  Location: [facility];  Service: Gynecology;  Laterality: N/A;  . ENDOMETRIAL ABLATION N/A 08/20/2019   Procedure: [place] Ablation;  Surgeon: Emily Filbert, MD;  Location: [facility];  Service: Gynecology;  Laterality: N/A;  . EYE SURGERY Bilateral    laser right and cataract removed left eye  . RADIOLOGY WITH ANESTHESIA N/A 09/16/2019   Procedure: MRI WITH ANESTHESIA   L SPINE WITHOUT CONTRAST, T SPINE WITHOUT CONTRAST ,  CERVICAL WITHOUT CONTRAST;  Surgeon: Radiologist, Medication, MD;  Location: [facility];  Service: Radiology;  Laterality: N/A;  . TUBAL LIGATION    . UPPER GI ENDOSCOPY  07/2017    Current Medications: No outpatient medications have been marked as taking for the 02/05/20 encounter (Appointment) with Geralynn Rile, MD.     Allergies:    Ketoprofen, Aspirin, Gabapentin, Ibuprofen, Liraglutide, Naproxen, Omeprazole-sodium bicarbonate, Sulfa antibiotics, and Tramadol   Social History: Social History   Socioeconomic History  . Marital status: Legally Separated    Spouse name: Not on file  . Number of children: Not on file  . Years of education: Not on file  . Highest education level: Not on file  Occupational History  . Occupation: unemployed  Tobacco Use  . Smoking status: Current Every Day Smoker    Packs/day: 0.50    Years: 26.00    Pack years: 13.00    Types: Cigarettes  . Smokeless tobacco: Never Used  Substance and Sexual Activity  . Alcohol use: No  . Drug use: No  . Sexual activity: Not Currently    Birth control/protection: None  Other Topics Concern  . Not on file  Social History Narrative  . Not on file  Social Determinants of Health   Financial Resource Strain:   . Difficulty of Paying Living Expenses:   Food Insecurity:   . Worried About Programme researcher, broadcasting/film/video in the Last Year:   . Barista in the Last Year:   Transportation Needs:   . Freight forwarder (Medical):   Marland Kitchen Lack of Transportation (Non-Medical):   Physical Activity:   . Days of Exercise per Week:   . Minutes of Exercise per Session:   Stress:   . Feeling of Stress :   Social Connections:   . Frequency of Communication with Friends and Family:   . Frequency of Social Gatherings with Friends and Family:   . Attends Religious Services:   . Active Member of Clubs or Organizations:   . Attends Banker Meetings:   Marland Kitchen Marital Status:      Family History: The  patient's ***family history includes Diabetes in her mother; Hypertension in her mother.  ROS:   All other ROS reviewed and negative. Pertinent positives noted in the HPI.     EKGs/Labs/Other Studies Reviewed:   The following studies were personally reviewed by me today:  EKG:  EKG is *** ordered today.  The ekg ordered today demonstrates ***, and was personally reviewed by me.   TTE 04/2017 Left ventricle: The cavity size was normal. Wall thickness was  increased in a pattern of mild LVH. Systolic function was normal.  The estimated ejection fraction was in the range of 55% to 60%.  Mildly reduced GLPSS at -15%. Wall motion was normal; there were  no regional wall motion abnormalities. The study is not  technically sufficient to allow evaluation of LV diastolic  function.  - Aortic valve: Sclerosis without stenosis. There was no  regurgitation.  - Left atrium: The atrium was normal in size.  - Inferior vena cava: The vessel was normal in size. The  respirophasic diameter changes were in the normal range (= 50%),  consistent with normal central venous pressure.   Recent Labs: 11/05/2019: TSH 3.100 02/03/2020: ALT 16; B Natriuretic Peptide 125.5; BUN 10; Creatinine, Ser 1.28; Hemoglobin 9.3; Magnesium 1.9; Platelets 229; Potassium 3.6; Sodium 141   Recent Lipid Panel    Component Value Date/Time   CHOL 151 11/05/2019 1409   TRIG 205 (H) 11/05/2019 1409   HDL 55 11/05/2019 1409   CHOLHDL 2.7 11/05/2019 1409   CHOLHDL 2.5 03/09/2017 1519   VLDL 29 03/09/2017 1519   LDLCALC 63 11/05/2019 1409    Physical Exam:   VS:  There were no vitals taken for this visit.   Wt Readings from Last 3 Encounters:  01/29/20 270 lb (122.5 kg)  12/26/19 284 lb (128.8 kg)  12/24/19 286 lb (129.7 kg)    General: Well nourished, well developed, in no acute distress Heart: Atraumatic, normal size  Eyes: PEERLA, EOMI  Neck: Supple, no JVD Endocrine: No thryomegaly Cardiac:  Normal S1, S2; RRR; no murmurs, rubs, or gallops Lungs: Clear to auscultation bilaterally, no wheezing, rhonchi or rales  Abd: Soft, nontender, no hepatomegaly  Ext: No edema, pulses 2+ Musculoskeletal: No deformities, BUE and BLE strength normal and equal Skin: Warm and dry, no rashes   Neuro: Alert and oriented to person, place, time, and situation, CNII-XII grossly intact, no focal deficits  Psych: Normal mood and affect   ASSESSMENT:   MARGEART ALLENDER is a 44 y.o. female who presents for the following: No diagnosis found.  PLAN:   There are  no diagnoses linked to this encounter.  Disposition: No follow-ups on file.  Medication Adjustments/Labs and Tests Ordered: Current medicines are reviewed at length with the patient today.  Concerns regarding medicines are outlined above.  No orders of the defined types were placed in this encounter.  No orders of the defined types were placed in this encounter.   There are no Patient Instructions on file for this visit.   Time Spent with Patient: I have spent a total of *** minutes with patient reviewing hospital notes, telemetry, EKGs, labs and examining the patient as well as establishing an assessment and plan that was discussed with the patient.  > 50% of time was spent in direct patient care.  Signed, Lenna Gilford. Flora Lipps, MD [facility]  [address], [address] [place], [place] [address] [phone]  02/05/2020 6:50 AM

## 2020-02-05 NOTE — Progress Notes (Signed)
No answer with call. Will have front desk try and rescheduled  Cornelia Copa MD Attending Center for [employer] (Faculty Practice) 02/04/2020

## 2020-02-09 ENCOUNTER — Telehealth: Payer: Self-pay | Admitting: Clinical

## 2020-02-09 ENCOUNTER — Encounter: Payer: Self-pay | Admitting: *Deleted

## 2020-02-09 ENCOUNTER — Inpatient Hospital Stay: Payer: No Typology Code available for payment source | Admitting: Nurse Practitioner

## 2020-02-09 NOTE — Telephone Encounter (Signed)
[facility] General Follow Up Note  02/09/2020 Name: Jill Shaw MRN: [id number] DOB: 1976/03/19 [name] is a 44 y.o. year old female who sees Barbette Merino, NP for primary care. LCSW was initially consulted to assess patient's access to community resources and assist the patient with Financial Difficulties related to low income and lack of insurance coverage.  Ongoing Intervention: Patient experiencing vision impairment and lack of health insurance coverage. Today CSW rescheduled patient's financial assistance appointment at [facility]. Patient missed her appointment due to being admitted in the hospital. Patient needs to apply for an [employer] and financial assistance.  Also checked in regarding [facility] follow up. Patient indicated she has an eye doctor appointment scheduled for tomorrow through the [facility] program.   Review of patient status, including review of consultants reports, relevant laboratory and other test results, and collaboration with appropriate care team members and the patient's provider was performed as part of comprehensive patient evaluation and provision of services.     Follow up Plan: 1. Patient to follow up with CSW if having difficulty affording eye glasses.   Abigail Butts, LCSW Patient Care Center [facility] [phone]

## 2020-02-12 ENCOUNTER — Other Ambulatory Visit: Payer: Self-pay

## 2020-02-12 ENCOUNTER — Encounter: Payer: Self-pay | Admitting: Nurse Practitioner

## 2020-02-12 ENCOUNTER — Ambulatory Visit (INDEPENDENT_AMBULATORY_CARE_PROVIDER_SITE_OTHER): Payer: No Typology Code available for payment source | Admitting: Nurse Practitioner

## 2020-02-12 VITALS — BP 173/73 | HR 116 | Temp 99.0°F | Ht 64.0 in | Wt 277.4 lb

## 2020-02-12 DIAGNOSIS — I10 Essential (primary) hypertension: Secondary | ICD-10-CM

## 2020-02-12 DIAGNOSIS — N183 Chronic kidney disease, stage 3 unspecified: Secondary | ICD-10-CM

## 2020-02-12 DIAGNOSIS — R609 Edema, unspecified: Secondary | ICD-10-CM

## 2020-02-12 DIAGNOSIS — M79604 Pain in right leg: Secondary | ICD-10-CM

## 2020-02-12 DIAGNOSIS — Z794 Long term (current) use of insulin: Secondary | ICD-10-CM

## 2020-02-12 DIAGNOSIS — N182 Chronic kidney disease, stage 2 (mild): Secondary | ICD-10-CM

## 2020-02-12 DIAGNOSIS — F321 Major depressive disorder, single episode, moderate: Secondary | ICD-10-CM

## 2020-02-12 DIAGNOSIS — J449 Chronic obstructive pulmonary disease, unspecified: Secondary | ICD-10-CM

## 2020-02-12 DIAGNOSIS — E119 Type 2 diabetes mellitus without complications: Secondary | ICD-10-CM

## 2020-02-12 DIAGNOSIS — M79605 Pain in left leg: Secondary | ICD-10-CM

## 2020-02-12 MED ORDER — BLOOD PRESSURE KIT DEVI: 0 refills | Status: DC

## 2020-02-12 MED ORDER — BLOOD GLUCOSE MONITOR KIT: PACK | 0 refills | Status: DC

## 2020-02-12 MED ORDER — DULOXETINE HCL 20 MG PO CPEP: 20.0000 mg | ORAL_CAPSULE | Freq: Every day | ORAL | 0 refills | Status: DC

## 2020-02-12 MED FILL — TRUEplus LANCETS 28G MISC: 25 days supply | Qty: 100 | Fill #0

## 2020-02-12 MED FILL — TRUE METRIX TEST STRIP: 25 days supply | Qty: 100 | Fill #0

## 2020-02-12 MED FILL — DULoxetine HCL 20 MG CPEP: 20 | 30 days supply | Qty: 30 | Fill #0

## 2020-02-12 NOTE — Progress Notes (Signed)
[facility]al [facility] General Follow Up Note  02/12/2020 Name: DAVY FAUGHT MRN: [id number] DOB: [birth year] JAELAH HAUTH is a 44 y.o. year old female who sees Barbette Merino, NP for primary care. LCSW was initially consulted to assess patient's access to community resources and assist the patient with financial Difficulties related tolow income and lack of insurance coverage. Patient was also in need of eye exam and eyeglasses resources.  Ongoing Intervention: Patient experiencing vision impairment and financial difficulties related to low income and lack of insurance coverage. Patient's appointment with financial counselor at [facility] has been rescheduled to 02/23/20. Today CSW reviewed patient's prescription from recent eye exam completed through [facility]. Patient cannot afford prescription eyeglasses through the optometrist where she received the exam. Assisted patient in submitting an application for [employer] which can provide free eyeglasses if patient is approved.   Review of patient status, including review of consultants reports, relevant laboratory and other test results, and collaboration with appropriate care team members and the patient's provider was performed as part of comprehensive patient evaluation and provision of services.     Follow up Plan: 1. Awaiting follow up on [employer] application.  Abigail Butts, LCSW Patient Care Center [facility] [phone]

## 2020-02-12 NOTE — Progress Notes (Signed)
Established Patient Office Visit  Subjective:  Patient ID: Jill Shaw, female    DOB: 11/17/1975  Age: 44 y.o. MRN: [id number]  CC:  Chief Complaint  Patient presents with  . Hospitalization Follow-up     ED 02/03/2020 Altered Mental Status    HPI Jill Shaw presents for hospital follow-up.  She is in today with her mother.  She was admitted to the hospital for 5 days for altered mental status.  She was diagnosed with acute encephalopathy. She is having aching and throbbing constantly.    Jill Shaw is a 44 y.o. female who presents with bilateral lower leg pain. Onset of the symptoms was gradual, starting about several months ago. Pain is currently located whole legs. Pain is described as aching and throbbing, with intensity at worst  8 on a 0-10 pain scale. The pain is constant. Associated  symptoms include: swelling. Impact of symptoms on Jill Shaw has been that she has been unable to tolerate the pain.. Symptoms have gradually worsened. Patient has had prior leg problems. Evaluation to date: MRI: abnormal Lateral cartilages changes to right patella. Treatment to date: rest. Past musculoskeletal history: positive for past Knee, ankle and foot pain bilaterally.  Edema Patient complains of edema in the left lower leg. The edema has been mild. Onset of symptoms was several months ago, and patient reports symptoms have Varied since that time. The edema is present all day. The patient states the problem has been intermittent for several months. The swelling has been aggravated by dependency of involved area and increased salt intake. The swelling has been relieved by diuretics. Associated factors include: shortness of breath and weight gain. Cardiac risk factors include diabetes mellitus, dyslipidemia, hypertension, microalbuminuria, obesity (BMI >= 30 kg/m2), sedentary lifestyle and smoking/ tobacco exposure.  It was brought to my attention that Jill Shaw takes it upon herself to use  more furosemide than prescribed.  With her previous hospitalization her potassium was critically low of 2.7. Instructed Jill Shaw on the severity of self-medicating.  Past Medical History:  Diagnosis Date  . Anemia   . Arthritis    knees, hands  . Asthma   . COPD (chronic obstructive pulmonary disease) ([place])   . Diabetes mellitus without complication ([place])    type 2  . Dysfunctional uterine bleeding   . GERD (gastroesophageal reflux disease)   . Hypertension   . Neuromuscular disorder (HCC)    neuropathy feet  . Seizures ([place]) 09/12/2017   pt states r/t stress and blood sugar - no meds last one 4 months ago, not seen neurologist  . Sickle cell trait ([place])   . Smoker   . Vitamin D deficiency 10/2019  . Wears glasses     Past Surgical History:  Procedure Laterality Date  . CESAREAN SECTION     x 1  . DILATION AND CURETTAGE OF UTERUS N/A 08/20/2019   Procedure: DILATATION AND CURETTAGE;  Surgeon: Emily Filbert, MD;  Location: [place];  Service: Gynecology;  Laterality: N/A;  . ENDOMETRIAL ABLATION N/A 08/20/2019   Procedure: [place] Ablation;  Surgeon: Emily Filbert, MD;  Location: [place];  Service: Gynecology;  Laterality: N/A;  . EYE SURGERY Bilateral    laser right and cataract removed left eye  . RADIOLOGY WITH ANESTHESIA N/A 09/16/2019   Procedure: MRI WITH ANESTHESIA   L SPINE WITHOUT CONTRAST, T SPINE WITHOUT CONTRAST , CERVICAL WITHOUT CONTRAST;  Surgeon: Radiologist, Medication, MD;  Location: [place];  Service: Radiology;  Laterality: N/A;  . TUBAL LIGATION    . UPPER GI ENDOSCOPY  07/2017    Family History  Problem Relation Age of Onset  . Diabetes Mother   . Hypertension Mother     Social History   Socioeconomic History  . Marital status: Legally Separated    Spouse name: Not on file  . Number of children: Not on file  . Years of education: Not on file  . Highest education level: Not on file  Occupational History  . Occupation: unemployed  Tobacco Use  .  Smoking status: Current Every Day Smoker    Packs/day: 0.50    Years: 26.00    Pack years: 13.00    Types: Cigarettes  . Smokeless tobacco: Never Used  Substance and Sexual Activity  . Alcohol use: No  . Drug use: No  . Sexual activity: Not Currently    Birth control/protection: None  Other Topics Concern  . Not on file  Social History Narrative  . Not on file   Social Determinants of Health   Financial Resource Strain:   . Difficulty of Paying Living Expenses:   Food Insecurity:   . Worried About Charity fundraiser in the Last Year:   . Arboriculturist in the Last Year:   Transportation Needs:   . Film/video editor (Medical):   Marland Kitchen Lack of Transportation (Non-Medical):   Physical Activity:   . Days of Exercise per Week:   . Minutes of Exercise per Session:   Stress:   . Feeling of Stress :   Social Connections:   . Frequency of Communication with Friends and Family:   . Frequency of Social Gatherings with Friends and Family:   . Attends Religious Services:   . Active Member of Clubs or Organizations:   . Attends Archivist Meetings:   Marland Kitchen Marital Status:   Intimate Partner Violence:   . Fear of Current or Ex-Partner:   . Emotionally Abused:   Marland Kitchen Physically Abused:   . Sexually Abused:     Outpatient Medications Prior to Visit  Medication Sig Dispense Refill  . albuterol (PROVENTIL) (2.5 MG/3ML) 0.083% nebulizer solution Take 3 mLs (2.5 mg total) by nebulization every 6 (six) hours as needed for wheezing or shortness of breath. 150 mL 3  . albuterol (VENTOLIN HFA) 108 (90 Base) MCG/ACT inhaler Inhale 2 puffs into the lungs every 6 (six) hours as needed for wheezing or shortness of breath. 8 g 3  . Blood Glucose Monitoring Suppl (TRUE METRIX METER) w/Device KIT 1 each by Does not apply route 4 (four) times daily -  before meals and at bedtime. 1 kit 0  . budesonide-formoterol (SYMBICORT) 160-4.5 MCG/ACT inhaler INHALE 2 PUFFS INTO THE LUNGS 2 (TWO) TIMES  DAILY. (Patient taking differently: Inhale 2 puffs into the lungs 2 (two) times daily. ) 10.2 g 3  . cetirizine (ZYRTEC) 10 MG tablet Take 1 tablet (10 mg total) by mouth daily. 30 tablet 3  . Cholecalciferol (VITAMIN D-3) 125 MCG (5000 UT) TABS Take 1 tablet by mouth daily. (Patient taking differently: Take 5,000 Units by mouth daily. ) 90 tablet 3  . ferrous sulfate 325 (65 FE) MG tablet Take 1 tablet (325 mg total) by mouth 3 (three) times daily with meals. 30 tablet 3  . fluticasone (FLONASE) 50 MCG/ACT nasal spray Place 2 sprays into both nostrils daily. 16 g 6  . furosemide (LASIX) 40 MG tablet Take 1 tablet (40 mg total) by  mouth daily. 30 tablet 0  . Glucosamine Sulfate 1000 MG CAPS Take 1 capsule (1,000 mg total) by mouth 2 (two) times daily. 180 capsule 3  . glucose blood (TRUE METRIX BLOOD GLUCOSE TEST) test strip Use as instructed 100 each 12  . hydrALAZINE (APRESOLINE) 50 MG tablet Take 1 tablet (50 mg total) by mouth 3 (three) times daily. 90 tablet 0  . hydrocortisone 2.5 % cream Apply topically 2 (two) times daily. 30 g 2  . Insulin Lispro Prot & Lispro (HUMALOG MIX 75/25 KWIKPEN) (75-25) 100 UNIT/ML Kwikpen INJECT 100 UNITS EVERY 12 HOURS (Patient taking differently: Inject 100 Units into the skin every 12 (twelve) hours. ) 15 mL 11  . Insulin Pen Needle (PEN NEEDLES) 30G X 5 MM MISC 1 Units by Does not apply route as directed. 100 each 11  . Lido-Capsaicin-Men-Methyl Sal 0.5-0.035-5-20 % PTCH Apply 1 patch topically daily. 30 patch 2  . losartan (COZAAR) 50 MG tablet Take 1 tablet (50 mg total) by mouth daily. 30 tablet 0  . metoprolol succinate (TOPROL-XL) 100 MG 24 hr tablet Take 1 tablet (100 mg total) by mouth daily. Take with or immediately following a meal. 90 tablet 3  . omeprazole (PRILOSEC) 40 MG capsule Take 1 capsule (40 mg total) by mouth daily. 30 capsule 3  . potassium chloride SA (KLOR-CON) 20 MEQ tablet Take 1 tablet (20 mEq total) by mouth daily. 30 tablet 3  .  saxagliptin HCl (ONGLYZA) 2.5 MG TABS tablet Take 1 tablet (2.5 mg total) by mouth daily. 90 tablet 3  . Turmeric 500 MG CAPS Take 500 mg by mouth 2 (two) times daily. 180 capsule 3  . Vitamin D, Ergocalciferol, (DRISDOL) 1.25 MG (50000 UNIT) CAPS capsule Take 1 capsule (50,000 Units total) by mouth every 7 (seven) days. (Patient taking differently: Take 50,000 Units by mouth every 7 (seven) days. Fridays) 5 capsule 6  . Lidocaine-Capsaicin 4-0.1 % CREA Apply 1 application topically 4 (four) times daily as needed. 30 g 2  . norethindrone (AYGESTIN) 5 MG tablet Take 2 tablets (10 mg total) by mouth every 8 (eight) hours. 90 tablet 1   No facility-administered medications prior to visit.    Allergies  Allergen Reactions  . Ketoprofen Nausea And Vomiting  . Aspirin Nausea Only  . Gabapentin Nausea And Vomiting    upset stomach  . Ibuprofen Nausea And Vomiting  . Liraglutide Nausea And Vomiting  . Naproxen Nausea And Vomiting  . Omeprazole-Sodium Bicarbonate Nausea And Vomiting  . Sulfa Antibiotics Nausea And Vomiting  . Tramadol Nausea And Vomiting    stomach upset    ROS Review of Systems  All other systems reviewed and are negative.     Objective:    Physical Exam  Constitutional: She is oriented to person, place, and time. No distress.  Obese  Cardiovascular: Normal rate and intact distal pulses.  1+ pitting edema  Pulmonary/Chest: Effort normal.  Diminished breath sounds  Musculoskeletal:        General: Normal range of motion.     Cervical back: Normal range of motion and neck supple.  Neurological: She is alert and oriented to person, place, and time.  Skin: Skin is warm and dry.  Psychiatric:  Admits to depressed mood Questionable judgment secondary to self-medicating with diuretics and continued tobacco use     BP (!) 173/73   Pulse (!) 116   Temp 99 F (37.2 C)   Ht _0  (1.626 m)   Wt 277  lb 6.4 oz (125.8 kg)   SpO2 98%   BMI 47.62 kg/m  Wt Readings  from Last 3 Encounters:  02/12/20 277 lb 6.4 oz (125.8 kg)  01/29/20 270 lb (122.5 kg)  12/26/19 284 lb (128.8 kg)     Health Maintenance Due  Topic Date Due  . COVID-19 Vaccine (1) Never done  . FOOT EXAM  09/22/2017  . OPHTHALMOLOGY EXAM  11/16/2017    There are no preventive care reminders to display for this patient.  Lab Results  Component Value Date   TSH 3.100 11/05/2019   Lab Results  Component Value Date   WBC 11.0 (H) 02/12/2020   HGB 10.9 (L) 02/12/2020   HCT 35.6 02/12/2020   MCV 80 02/12/2020   PLT 359 02/12/2020   Lab Results  Component Value Date   NA 140 02/12/2020   K 4.4 02/12/2020   CO2 25 02/03/2020   GLUCOSE 323 (H) 02/12/2020   BUN 11 02/12/2020   CREATININE 1.27 (H) 02/12/2020   BILITOT 0.5 02/12/2020   ALKPHOS 90 02/12/2020   AST 68 (H) 02/12/2020   ALT 16 02/03/2020   PROT 7.1 02/12/2020   ALBUMIN 4.2 02/12/2020   CALCIUM 9.3 02/12/2020   ANIONGAP 10 02/03/2020   Lab Results  Component Value Date   CHOL 151 11/05/2019   Lab Results  Component Value Date   HDL 55 11/05/2019   Lab Results  Component Value Date   LDLCALC 63 11/05/2019   Lab Results  Component Value Date   TRIG 205 (H) 11/05/2019   Lab Results  Component Value Date   CHOLHDL 2.7 11/05/2019   Lab Results  Component Value Date   HGBA1C 9.5 (H) 01/30/2020      Assessment & Plan:   Problem List Items Addressed This Visit      High   CKD (chronic kidney disease) stage 2, GFR 60-89 ml/min - Primary     Unprioritized   Chronic obstructive pulmonary disease ([place])   Essential hypertension   Relevant Orders   CBC with Differential/Platelet (Completed)   Comp. Metabolic Panel (12) (Completed)   Type 2 diabetes mellitus without complication, with long-term current use of insulin (HCC)   Relevant Orders   CBC with Differential/Platelet (Completed)   Comp. Metabolic Panel (12) (Completed)    Other Visit Diagnoses    Edema, unspecified type        Instructed patient and mother on the severity of self medicating with diuretics. Cardiology referral on December 26, 2019 to be revisited   Relevant Orders   Comp. Metabolic Panel (12) (Completed)   Stage 3 chronic kidney disease, unspecified whether stage 3a or 3b CKD       Relevant Orders   CBC with Differential/Platelet (Completed)   Comp. Metabolic Panel (12) (Completed)   Leg pain, bilateral       Relevant Medications   DULoxetine (CYMBALTA) 20 MG capsule   Current moderate episode of major depressive disorder without prior episode (HCC)       Cymbalta 20 mg daily for 1 to 2 weeks may increase to 1 tablet twice daily prior to next office visit   Relevant Medications   DULoxetine (CYMBALTA) 20 MG capsule      Meds ordered this encounter  Medications  . Blood Pressure Monitoring (BLOOD PRESSURE KIT) DEVI    Sig: Check BP BID prn    Dispense:  1 each    Refill:  0    Order Specific Question:  Supervising Provider    Answer:   Tresa Garter [id number]  . blood glucose meter kit and supplies KIT    Sig: Dispense based on patient and insurance preference. Use up to four times daily as directed. (FOR ICD-9 250.00, 250.01).    Dispense:  1 each    Refill:  0    Order Specific Question:   Supervising Provider    Answer:   Tresa Garter [id number]    Order Specific Question:   Number of strips    Answer:   100    Order Specific Question:   Number of lancets    Answer:   100  . DULoxetine (CYMBALTA) 20 MG capsule    Sig: Take 1 capsule (20 mg total) by mouth daily.    Dispense:  60 capsule    Refill:  0    Order Specific Question:   Supervising Provider    Answer:   Tresa Garter [[id number]]    Follow-up: Return in about 4 weeks (around 03/11/2020).    Vevelyn Francois, NP

## 2020-02-12 NOTE — Patient Instructions (Signed)
Steps to Quit Smoking Smoking tobacco is the leading cause of preventable death. It can affect almost every organ in the body. Smoking puts you and people around you at risk for many serious, long-lasting (chronic) diseases. Quitting smoking can be hard, but it is one of the best things that you can do for your health. It is never too late to quit. How do I get ready to quit? When you decide to quit smoking, make a plan to help you succeed. Before you quit:  Pick a date to quit. Set a date within the next 2 weeks to give you time to prepare.  Write down the reasons why you are quitting. Keep this list in places where you will see it often.  Tell your family, friends, and co-workers that you are quitting. Their support is important.  Talk with your doctor about the choices that may help you quit.  Find out if your health insurance will pay for these treatments.  Know the people, places, things, and activities that make you want to smoke (triggers). Avoid them. What first steps can I take to quit smoking?  Throw away all cigarettes at home, at work, and in your car.  Throw away the things that you use when you smoke, such as ashtrays and lighters.  Clean your car. Make sure to empty the ashtray.  Clean your home, including curtains and carpets. What can I do to help me quit smoking? Talk with your doctor about taking medicines and seeing a counselor at the same time. You are more likely to succeed when you do both.  If you are pregnant or breastfeeding, talk with your doctor about counseling or other ways to quit smoking. Do not take medicine to help you quit smoking unless your doctor tells you to do so. To quit smoking: Quit right away  Quit smoking totally, instead of slowly cutting back on how much you smoke over a period of time.  Go to counseling. You are more likely to quit if you go to counseling sessions regularly. Take medicine You may take medicines to help you quit. Some  medicines need a prescription, and some you can buy over-the-counter. Some medicines may contain a drug called nicotine to replace the nicotine in cigarettes. Medicines may:  Help you to stop having the desire to smoke (cravings).  Help to stop the problems that come when you stop smoking (withdrawal symptoms). Your doctor may ask you to use:  Nicotine patches, gum, or lozenges.  Nicotine inhalers or sprays.  Non-nicotine medicine that is taken by mouth. Find resources Find resources and other ways to help you quit smoking and remain smoke-free after you quit. These resources are most helpful when you use them often. They include:  Online chats with a counselor.  Phone quitlines.  Printed self-help materials.  Support groups or group counseling.  Text messaging programs.  Mobile phone apps. Use apps on your mobile phone or tablet that can help you stick to your quit plan. There are many free apps for mobile phones and tablets as well as websites. Examples include Quit Guide from the CDC and smokefree.gov  What things can I do to make it easier to quit?   Talk to your family and friends. Ask them to support and encourage you.  Call a phone quitline (1-800-QUIT-NOW), reach out to support groups, or work with a counselor.  Ask people who smoke to not smoke around you.  Avoid places that make you want to smoke,   such as: ? Bars. ? Parties. ? Smoke-break areas at work.  Spend time with people who do not smoke.  Lower the stress in your life. Stress can make you want to smoke. Try these things to help your stress: ? Getting regular exercise. ? Doing deep-breathing exercises. ? Doing yoga. ? Meditating. ? Doing a body scan. To do this, close your eyes, focus on one area of your body at a time from head to toe. Notice which parts of your body are tense. Try to relax the muscles in those areas. How will I feel when I quit smoking? Day 1 to 3 weeks Within the first 24 hours,  you may start to have some problems that come from quitting tobacco. These problems are very bad 2-3 days after you quit, but they do not often last for more than 2-3 weeks. You may get these symptoms:  Mood swings.  Feeling restless, nervous, angry, or annoyed.  Trouble concentrating.  Dizziness.  Strong desire for high-sugar foods and nicotine.  Weight gain.  Trouble pooping (constipation).  Feeling like you may vomit (nausea).  Coughing or a sore throat.  Changes in how the medicines that you take for other issues work in your body.  Depression.  Trouble sleeping (insomnia). Week 3 and afterward After the first 2-3 weeks of quitting, you may start to notice more positive results, such as:  Better sense of smell and taste.  Less coughing and sore throat.  Slower heart rate.  Lower blood pressure.  Clearer skin.  Better breathing.  Fewer sick days. Quitting smoking can be hard. Do not give up if you fail the first time. Some people need to try a few times before they succeed. Do your best to stick to your quit plan, and talk with your doctor if you have any questions or concerns. Summary  Smoking tobacco is the leading cause of preventable death. Quitting smoking can be hard, but it is one of the best things that you can do for your health.  When you decide to quit smoking, make a plan to help you succeed.  Quit smoking right away, not slowly over a period of time.  When you start quitting, seek help from your doctor, family, or friends. This information is not intended to replace advice given to you by your health care provider. Make sure you discuss any questions you have with your health care provider. Document Revised: 06/27/2019 Document Reviewed: 12/21/2018 Elsevier Patient Education  2020 [employer]  

## 2020-02-13 ENCOUNTER — Other Ambulatory Visit: Payer: Self-pay

## 2020-02-13 DIAGNOSIS — R609 Edema, unspecified: Secondary | ICD-10-CM

## 2020-02-13 DIAGNOSIS — R000 Tachycardia, unspecified: Secondary | ICD-10-CM

## 2020-02-13 DIAGNOSIS — I10 Essential (primary) hypertension: Secondary | ICD-10-CM

## 2020-02-13 LAB — CBC WITH DIFFERENTIAL/PLATELET
Basophils Absolute: 0.1 10*3/uL (ref 0.0–0.2)
Basos: 1 %
EOS (ABSOLUTE): 0.5 10*3/uL — ABNORMAL HIGH (ref 0.0–0.4)
Eos: 4 %
Hematocrit: 35.6 % (ref 34.0–46.6)
Hemoglobin: 10.9 g/dL — ABNORMAL LOW (ref 11.1–15.9)
Immature Grans (Abs): 0 10*3/uL (ref 0.0–0.1)
Immature Granulocytes: 0 %
Lymphocytes Absolute: 2.1 10*3/uL (ref 0.7–3.1)
Lymphs: 19 %
MCH: 24.4 pg — ABNORMAL LOW (ref 26.6–33.0)
MCHC: 30.6 g/dL — ABNORMAL LOW (ref 31.5–35.7)
MCV: 80 fL (ref 79–97)
Monocytes Absolute: 0.6 10*3/uL (ref 0.1–0.9)
Monocytes: 5 %
Neutrophils Absolute: 7.8 10*3/uL — ABNORMAL HIGH (ref 1.4–7.0)
Neutrophils: 71 %
Platelets: 359 10*3/uL (ref 150–450)
RBC: 4.47 x10E6/uL (ref 3.77–5.28)
RDW: 21.5 % — ABNORMAL HIGH (ref 11.7–15.4)
WBC: 11 10*3/uL — ABNORMAL HIGH (ref 3.4–10.8)

## 2020-02-13 LAB — COMP. METABOLIC PANEL (12)
AST: 68 IU/L — ABNORMAL HIGH (ref 0–40)
Albumin/Globulin Ratio: 1.4 (ref 1.2–2.2)
Albumin: 4.2 g/dL (ref 3.8–4.8)
Alkaline Phosphatase: 90 IU/L (ref 39–117)
BUN/Creatinine Ratio: 9 (ref 9–23)
BUN: 11 mg/dL (ref 6–24)
Bilirubin Total: 0.5 mg/dL (ref 0.0–1.2)
Calcium: 9.3 mg/dL (ref 8.7–10.2)
Chloride: 104 mmol/L (ref 96–106)
Creatinine, Ser: 1.27 mg/dL — ABNORMAL HIGH (ref 0.57–1.00)
GFR calc Af Amer: 60 mL/min/{1.73_m2} (ref >59)
GFR calc non Af Amer: 52 mL/min/{1.73_m2} — ABNORMAL LOW (ref >59)
Globulin, Total: 2.9 g/dL (ref 1.5–4.5)
Glucose: 323 mg/dL — ABNORMAL HIGH (ref 65–99)
Potassium: 4.4 mmol/L (ref 3.5–5.2)
Sodium: 140 mmol/L (ref 134–144)
Total Protein: 7.1 g/dL (ref 6.0–8.5)

## 2020-02-13 MED FILL — !TRUE METRIX BLOOD GLUCOSE: 365 days supply | Qty: 1 | Fill #0

## 2020-02-16 ENCOUNTER — Other Ambulatory Visit: Payer: Self-pay | Admitting: Nurse Practitioner

## 2020-02-16 DIAGNOSIS — F17200 Nicotine dependence, unspecified, uncomplicated: Secondary | ICD-10-CM

## 2020-02-16 DIAGNOSIS — D72829 Elevated white blood cell count, unspecified: Secondary | ICD-10-CM

## 2020-02-16 DIAGNOSIS — J449 Chronic obstructive pulmonary disease, unspecified: Secondary | ICD-10-CM

## 2020-02-17 ENCOUNTER — Other Ambulatory Visit: Payer: Self-pay | Admitting: Nurse Practitioner

## 2020-02-17 ENCOUNTER — Other Ambulatory Visit: Payer: Self-pay

## 2020-02-17 DIAGNOSIS — D72828 Other elevated white blood cell count: Secondary | ICD-10-CM

## 2020-02-17 MED FILL — ?CETIRIZINE HCL 10 MG TABLE: 10 | 30 days supply | Qty: 30 | Fill #1

## 2020-02-17 NOTE — Telephone Encounter (Signed)
Rx Zanaflex refill request

## 2020-02-18 ENCOUNTER — Telehealth: Payer: Self-pay

## 2020-02-18 MED FILL — tiZANidine HCL 6 MG CAPS: 6 | 30 days supply | Qty: 90 | Fill #0

## 2020-02-18 NOTE — Telephone Encounter (Signed)
Task already in progress.

## 2020-02-18 NOTE — Telephone Encounter (Signed)
Called spoke w/ patient  yesterday she did received a call for cardiology she is waiting on letter from [facility] to be able to afford to go. She is planning to go once she gets her letter or money to go. Advised pt to go for x-ray , pt stated that she is going have that done today.

## 2020-02-18 NOTE — Telephone Encounter (Signed)
Cardiology has reached out to [facility] for the referral. I called to remind her to call them back for an appointment. Message left to see if she had her X-ray?

## 2020-02-19 ENCOUNTER — Telehealth (INDEPENDENT_AMBULATORY_CARE_PROVIDER_SITE_OTHER): Payer: Self-pay | Admitting: Obstetrics and Gynecology

## 2020-02-19 ENCOUNTER — Other Ambulatory Visit: Payer: Self-pay

## 2020-02-19 DIAGNOSIS — N939 Abnormal uterine and vaginal bleeding, unspecified: Secondary | ICD-10-CM

## 2020-02-19 DIAGNOSIS — R109 Unspecified abdominal pain: Secondary | ICD-10-CM

## 2020-02-19 NOTE — Progress Notes (Signed)
9;16A- Called pt , no answer, left VM that will call back.   I connected with  Jill Shaw on 02/19/20 at  8:55 AM EDT by telephone and verified that I am speaking with the correct person using two identifiers.   I discussed the limitations, risks, security and privacy concerns of performing an evaluation and management service by telephone and the availability of in person appointments. I also discussed with the patient that there may be a patient responsible charge related to this service. The patient expressed understanding and agreed to proceed.  Henrietta Dine, CMA 02/19/2020  9:31 AM

## 2020-02-19 NOTE — Progress Notes (Signed)
TELEHEALTH VIRTUAL GYNECOLOGY VISIT ENCOUNTER NOTE  I connected with Jill Shaw on 02/19/20 at  8:55 AM EDT by telephone at home and verified that I am speaking with the correct person using two identifiers.   I discussed the limitations, risks, security and privacy concerns of performing an evaluation and management service by telephone and the availability of in person appointments. I also discussed with the patient that there may be a patient responsible charge related to this service. The patient expressed understanding and agreed to proceed.  Chief Complaint: follow up aub and abdominal pain  History:  Jill Shaw is a 44 y.o. here for follow up  [facility] for 16 days and it stopped yesterday with intense pain; bleeding was about 4-5 pads non saturated qday. She is taking the aygestin 10mg  twice a day.   Past Medical History:  Diagnosis Date  . Anemia   . Arthritis    knees, hands  . Asthma   . COPD (chronic obstructive pulmonary disease) ([place])   . Diabetes mellitus without complication ([place])    type 2  . Dysfunctional uterine bleeding   . GERD (gastroesophageal reflux disease)   . Hypertension   . Neuromuscular disorder (H[facility])    neuropathy feet  . Seizures ([place]) 09/12/2017   pt states r/t stress and blood sugar - no meds last one 4 months ago, not seen neurologist  . Sickle cell trait ([place])   . Smoker   . Vitamin D deficiency 10/2019  . Wears glasses    Past Surgical History:  Procedure Laterality Date  . CESAREAN SECTION     x 1  . DILATION AND CURETTAGE OF UTERUS N/A 08/20/2019   Procedure: DILATATION AND CURETTAGE;  Surgeon: Emily Filbert, MD;  Location: [place];  Service: Gynecology;  Laterality: N/A;  . ENDOMETRIAL ABLATION N/A 08/20/2019   Procedure: [place] Ablation;  Surgeon: Emily Filbert, MD;  Location: [place];  Service: Gynecology;  Laterality: N/A;  . EYE SURGERY Bilateral    laser right and cataract removed left eye  . RADIOLOGY WITH ANESTHESIA N/A  09/16/2019   Procedure: MRI WITH ANESTHESIA   L SPINE WITHOUT CONTRAST, T SPINE WITHOUT CONTRAST , CERVICAL WITHOUT CONTRAST;  Surgeon: Radiologist, Medication, MD;  Location: [place];  Service: Radiology;  Laterality: N/A;  . TUBAL LIGATION    . UPPER GI ENDOSCOPY  07/2017   The following portions of the patient's history were reviewed and updated as appropriate: allergies, current medications, past family history, past medical history, past social history, past surgical history and problem list.   Review of Systems:  Pertinent items noted in HPI and remainder of comprehensive ROS otherwise negative.  Physical Exam:   General:  Alert, oriented and cooperative.   Mental Status: Normal mood and affect perceived. Normal judgment and thought content.  Physical exam deferred due to nature of the encounter  Labs and Imaging Results for orders placed or performed in visit on 02/12/20 (from the past 336 hour(s))  CBC with Differential/Platelet   Collection Time: 02/12/20  3:54 PM  Result Value Ref Range   WBC 11.0 (H) 3.4 - 10.8 x10E3/uL   RBC 4.47 3.77 - 5.28 x10E6/uL   Hemoglobin 10.9 (L) 11.1 - 15.9 g/dL   Hematocrit 35.6 34.0 - 46.6 %   MCV 80 79 - 97 fL   MCH 24.4 (L) 26.6 - 33.0 pg   MCHC 30.6 (L) 31.5 - 35.7 g/dL   RDW 21.5 (H) 11.7 -  15.4 %   Platelets 359 150 - 450 x10E3/uL   Neutrophils 71 Not Estab. %   Lymphs 19 Not Estab. %   Monocytes 5 Not Estab. %   Eos 4 Not Estab. %   Basos 1 Not Estab. %   Neutrophils Absolute 7.8 (H) 1.4 - 7.0 x10E3/uL   Lymphocytes Absolute 2.1 0.7 - 3.1 x10E3/uL   Monocytes Absolute 0.6 0.1 - 0.9 x10E3/uL   EOS (ABSOLUTE) 0.5 (H) 0.0 - 0.4 x10E3/uL   Basophils Absolute 0.1 0.0 - 0.2 x10E3/uL   Immature Granulocytes 0 Not Estab. %   Immature Grans (Abs) 0.0 0.0 - 0.1 x10E3/uL  Comp. Metabolic Panel (12)   Collection Time: 02/12/20  3:54 PM  Result Value Ref Range   Glucose 323 (H) 65 - 99 mg/dL   BUN 11 6 - 24 mg/dL   Creatinine, Ser 4.23 (H)  0.57 - 1.00 mg/dL   GFR calc non Af Amer 52 (L) >59 mL/min/1.73   GFR calc Af Amer 60 >59 mL/min/1.73   BUN/Creatinine Ratio 9 9 - 23   Sodium 140 134 - 144 mmol/L   Potassium 4.4 3.5 - 5.2 mmol/L   Chloride 104 96 - 106 mmol/L   Calcium 9.3 8.7 - 10.2 mg/dL   Total Protein 7.1 6.0 - 8.5 g/dL   Albumin 4.2 3.8 - 4.8 g/dL   Globulin, Total 2.9 1.5 - 4.5 g/dL   Albumin/Globulin Ratio 1.4 1.2 - 2.2   Bilirubin Total 0.5 0.0 - 1.2 mg/dL   Alkaline Phosphatase 90 39 - 117 IU/L   AST 68 (H) 0 - 40 IU/L   DG Abdomen 1 View  Result Date: 01/29/2020 CLINICAL DATA:  Sickle cell.  Altered mental status EXAM: ABDOMEN - 1 VIEW COMPARISON:  None. FINDINGS: The nonspecific bowel gas pattern without evidence of obstruction. Air is seen within large and small bowel loops throughout the abdomen. No radio-opaque calculi or other significant radiographic abnormality are seen. No acute osseous findings. IMPRESSION: Nonspecific bowel gas pattern. Electronically Signed   By: Duanne Guess D.O.   On: 01/29/2020 13:34   CT Head Wo Contrast  Result Date: 01/29/2020 CLINICAL DATA:  . Found unresponsive. Recent fall. EXAM: CT HEAD WITHOUT CONTRAST CT CERVICAL SPINE WITHOUT CONTRAST TECHNIQUE: Multidetector CT imaging of the head and cervical spine was performed following the standard protocol without intravenous contrast. Multiplanar CT image reconstructions of the cervical spine were also generated. COMPARISON:  None. FINDINGS: CT HEAD FINDINGS Brain: There is no mass, hemorrhage or extra-axial collection. The size and configuration of the ventricles and extra-axial CSF spaces are normal. The brain parenchyma is normal, without evidence of acute or chronic infarction. Vascular: No abnormal hyperdensity of the major intracranial arteries or dural venous sinuses. No intracranial atherosclerosis. Skull: The visualized skull base, calvarium and extracranial soft tissues are normal. Sinuses/Orbits: No fluid levels or  advanced mucosal thickening of the visualized paranasal sinuses. No mastoid or middle ear effusion. The orbits are normal. CT CERVICAL SPINE FINDINGS Alignment: No static subluxation. Facets are aligned. Occipital condyles are normally positioned. Skull base and vertebrae: No acute fracture. Soft tissues and spinal canal: No prevertebral fluid or swelling. No visible canal hematoma. Disc levels: No advanced spinal canal or neural foraminal stenosis. Upper chest: No pneumothorax, pulmonary nodule or pleural effusion. Other: Normal visualized paraspinal cervical soft tissues. IMPRESSION: 1. No acute intracranial abnormality. 2. No acute fracture or static subluxation of the cervical spine. Electronically Signed   By: Chrisandra Netters.D.  On: 01/29/2020 03:58   CT Cervical Spine Wo Contrast  Result Date: 01/29/2020 CLINICAL DATA:  . Found unresponsive. Recent fall. EXAM: CT HEAD WITHOUT CONTRAST CT CERVICAL SPINE WITHOUT CONTRAST TECHNIQUE: Multidetector CT imaging of the head and cervical spine was performed following the standard protocol without intravenous contrast. Multiplanar CT image reconstructions of the cervical spine were also generated. COMPARISON:  None. FINDINGS: CT HEAD FINDINGS Brain: There is no mass, hemorrhage or extra-axial collection. The size and configuration of the ventricles and extra-axial CSF spaces are normal. The brain parenchyma is normal, without evidence of acute or chronic infarction. Vascular: No abnormal hyperdensity of the major intracranial arteries or dural venous sinuses. No intracranial atherosclerosis. Skull: The visualized skull base, calvarium and extracranial soft tissues are normal. Sinuses/Orbits: No fluid levels or advanced mucosal thickening of the visualized paranasal sinuses. No mastoid or middle ear effusion. The orbits are normal. CT CERVICAL SPINE FINDINGS Alignment: No static subluxation. Facets are aligned. Occipital condyles are normally positioned. Skull base  and vertebrae: No acute fracture. Soft tissues and spinal canal: No prevertebral fluid or swelling. No visible canal hematoma. Disc levels: No advanced spinal canal or neural foraminal stenosis. Upper chest: No pneumothorax, pulmonary nodule or pleural effusion. Other: Normal visualized paraspinal cervical soft tissues. IMPRESSION: 1. No acute intracranial abnormality. 2. No acute fracture or static subluxation of the cervical spine. Electronically Signed   By: Deatra Robinson M.D.   On: 01/29/2020 03:58   MR BRAIN WO CONTRAST  Result Date: 01/30/2020 CLINICAL DATA:  Altered mental status, unclear cause. EXAM: MRI HEAD WITHOUT CONTRAST TECHNIQUE: Multiplanar, multiecho pulse sequences of the brain and surrounding structures were obtained without intravenous contrast. COMPARISON:  Head CT 01/29/2020, brain MRI 02/14/2019 FINDINGS: Brain: There is no evidence of acute infarct. No evidence of intracranial mass. No midline shift or extra-axial fluid collection. No chronic intracranial blood products. No focal parenchymal signal abnormality is demonstrated. Cerebral volume is normal for age. Vascular: Flow voids maintained within the proximal large arterial vessels. Skull and upper cervical spine: No focal marrow lesion. T1 hypointense marrow signal within the visualized upper cervical spine. Sinuses/Orbits: Visualized orbits demonstrate no acute abnormality. Mild paranasal sinus mucosal thickening greatest within bilateral ethmoid air cells and within the left sphenoid sinus. No significant mastoid effusion. IMPRESSION: Unremarkable non-contrast MRI appearance of the brain. No evidence of acute intracranial abnormality. Mild paranasal sinus mucosal thickening. T1 hypointense marrow signal within the visualized upper cervical spine. While this finding may be seen in the setting of a marrow infiltrative process, the most common causes include chronic anemia, smoking and obesity. Electronically Signed   By: Jackey Loge  DO   On: 01/30/2020 18:56   CT ABDOMEN PELVIS W CONTRAST  Result Date: 01/31/2020 CLINICAL DATA:  Abdominal distension. Nausea and vomiting. EXAM: CT ABDOMEN AND PELVIS WITH CONTRAST TECHNIQUE: Multidetector CT imaging of the abdomen and pelvis was performed using the standard protocol following bolus administration of intravenous contrast. CONTRAST:  OMNIPAQUE IOHEXOL 300 MG/ML  SOLN COMPARISON:  None. FINDINGS: Lower chest: No acute abnormality. Hepatobiliary: Scattered hypodense areas within the liver, the most masslike appreciated at the posterior-inferior margin of the RIGHT liver lobe measuring 8.4 cm, most likely atypical geographic fatty infiltration but neoplastic or infectious process cannot be entirely excluded. Gallbladder appears normal. No bile duct dilatation. Pancreas: Unremarkable. No pancreatic ductal dilatation or surrounding inflammatory changes. Spleen: Normal in size without focal abnormality. Adrenals/Urinary Tract: Adrenal glands appear normal. Kidneys are unremarkable without mass, stone or  hydronephrosis. No perinephric fluid. No ureteral or bladder calculi. Bladder is unremarkable, partially decompressed. Stomach/Bowel: No dilated large or small bowel loops. No evidence of bowel wall inflammation. Appendix is normal. Stomach is unremarkable, partially decompressed. Vascular/Lymphatic: No significant vascular findings are present. No enlarged abdominal or pelvic lymph nodes. Reproductive: Probable uterine fibroids. No adnexal mass or free fluid seen. Other: No free fluid or abscess collection. No free intraperitoneal air. Musculoskeletal: No acute or suspicious osseous finding. IMPRESSION: 1. Scattered hypodense areas within the liver, the most masslike appreciated at the posterior-inferior margin of the RIGHT liver lobe measuring 8.4 cm, most likely atypical geographic fatty infiltration but neoplastic or infectious process cannot be entirely excluded. Recommend nonemergent liver  MRI with contrast for further characterization. When the patient is clinically stable and able to follow directions and hold their breath (preferably as an outpatient) further evaluation with dedicated abdominal MRI should be considered. 2. Remainder of the abdomen and pelvis CT is unremarkable. No bowel obstruction or evidence of bowel wall inflammation. No free fluid or abscess collection. No evidence of acute solid organ abnormality. No renal or ureteral calculi. Appendix is normal. Electronically Signed   By: Bary Richard M.D.   On: 01/31/2020 17:06   DG Chest Portable 1 View  Result Date: 01/29/2020 CLINICAL DATA:  Altered mental status EXAM: PORTABLE CHEST 1 VIEW COMPARISON:  02/07/2019 FINDINGS: Cardiomegaly accentuated by portable technique. Minimal streaky density at the right base, likely atelectasis. Negative aortic and hilar contours for technique. No effusion or pneumothorax. IMPRESSION: Cardiomegaly without failure. Probable mild atelectasis on the right. Electronically Signed   By: Marnee Spring M.D.   On: 01/29/2020 04:46   DG Abd Portable 1V  Result Date: 01/31/2020 CLINICAL DATA:  Nausea and vomiting. EXAM: PORTABLE ABDOMEN - 1 VIEW COMPARISON:  Radiograph 01/29/2020. FINDINGS: No evidence of free air on supine views. No bowel dilatation to suggest obstruction. No evidence of radiopaque calculi, sensitivity diminished by soft tissue attenuation from habitus. No other interval change. IMPRESSION: Nonobstructive bowel gas pattern. Electronically Signed   By: Narda Rutherford M.D.   On: 01/31/2020 15:07   EEG adult  Result Date: 01/30/2020 Charlsie Quest, MD     01/30/2020 11:16 AM Patient Name: KEYAUNA GRAEFE MRN: [id number] Epilepsy Attending: Charlsie Quest Referring Physician/Provider: Dr. Susa Raring Date: 01/30/2020 Duration: 23.51 mins Patient history: 44 year old female with history of diabetes, polysubstance abuse, seizures presented with altered mental status.  EEG  evaluate for seizures. Level of alertness: Awake AEDs during EEG study: None Technical aspects: This EEG study was done with scalp electrodes positioned according to the 10-20 International system of electrode placement. Electrical activity was acquired at a sampling rate of 500Hz  and reviewed with a high frequency filter of 70Hz  and a low frequency filter of 1Hz . EEG data were recorded continuously and digitally stored. Description: Although difficult to appreciate due to significant electrode artifact throughout the EEG, the posterior dominant rhythm of 9 Hz activity of moderate voltage (25-35 uV) seen predominantly in posterior head regions, symmetric was noted.  Hyperventilation and photic stimulation were not performed. Of note, EEG was technically difficult due to eyelid flutter artifact throughout the study. IMPRESSION: This technically difficult study is within normal limits. No seizures or epileptiform discharges were seen throughout the recording. Priyanka O Yadav      Assessment and Plan:     1. Abnormal uterine bleeding (AUB) I told her that we are definitely in a difficult spot. I told her that our  options are limited since she has had an endometrial ablation. I told her that I definitely want to try depo provera to see if that can help and try to avoid surgery at all costs because the only surgery left for her is a hysterectomy which would be very difficult to do because of her size and extremely risky for her given her multiple medical co-morbidities. I told her that a d&c wouldn't be help since the uterine cavity is likely very scarred down from the endometrial ablation. Pt amenable to trying depo provera. Ideally , a 6 month trial is hoped for. I told her to continue the aygestin for 2 weeks after getting the depo provera shot  2. Abdominal pain, unspecified abdominal location       I discussed the assessment and treatment plan with the patient. The patient was provided an opportunity to  ask questions and all were answered. The patient agreed with the plan and demonstrated an understanding of the instructions.   The patient was advised to call back or seek an in-person evaluation/go to the ED if the symptoms worsen or if the condition fails to improve as anticipated.  I provided 15 minutes of non-face-to-face time during this encounter. The visit was done via a phone visit.   RTC for depo provera shot and then f/u visit one month after shot.    Seguin Bingharlie Truman Aceituno, MD [facility]

## 2020-02-20 ENCOUNTER — Telehealth: Payer: Self-pay | Admitting: Clinical

## 2020-02-20 ENCOUNTER — Emergency Department (HOSPITAL_COMMUNITY): Payer: Medicaid Other

## 2020-02-20 ENCOUNTER — Emergency Department (HOSPITAL_COMMUNITY)
Admission: EM | Admit: 2020-02-20 | Discharge: 2020-02-20 | Disposition: A | Discharge status: Home / Self Care | Payer: Medicaid Other | Attending: Emergency Medicine | Admitting: Emergency Medicine

## 2020-02-20 DIAGNOSIS — J449 Chronic obstructive pulmonary disease, unspecified: Secondary | ICD-10-CM | POA: Insufficient documentation

## 2020-02-20 DIAGNOSIS — Z79899 Other long term (current) drug therapy: Secondary | ICD-10-CM | POA: Diagnosis not present

## 2020-02-20 DIAGNOSIS — N182 Chronic kidney disease, stage 2 (mild): Secondary | ICD-10-CM | POA: Diagnosis not present

## 2020-02-20 DIAGNOSIS — Z794 Long term (current) use of insulin: Secondary | ICD-10-CM | POA: Diagnosis not present

## 2020-02-20 DIAGNOSIS — E1122 Type 2 diabetes mellitus with diabetic chronic kidney disease: Secondary | ICD-10-CM | POA: Insufficient documentation

## 2020-02-20 DIAGNOSIS — I129 Hypertensive chronic kidney disease with stage 1 through stage 4 chronic kidney disease, or unspecified chronic kidney disease: Secondary | ICD-10-CM | POA: Insufficient documentation

## 2020-02-20 DIAGNOSIS — Z20822 Contact with and (suspected) exposure to covid-19: Secondary | ICD-10-CM | POA: Diagnosis not present

## 2020-02-20 DIAGNOSIS — I952 Hypotension due to drugs: Secondary | ICD-10-CM | POA: Diagnosis not present

## 2020-02-20 DIAGNOSIS — R404 Transient alteration of awareness: Secondary | ICD-10-CM | POA: Diagnosis present

## 2020-02-20 LAB — BASIC METABOLIC PANEL
Anion gap: 13 (ref 5–15)
BUN: 10 mg/dL (ref 6–20)
CO2: 22 mmol/L (ref 22–32)
Calcium: 9.3 mg/dL (ref 8.9–10.3)
Chloride: 101 mmol/L (ref 98–111)
Creatinine, Ser: 1.54 mg/dL — ABNORMAL HIGH (ref 0.44–1.00)
GFR calc Af Amer: 47 mL/min — ABNORMAL LOW (ref >60)
GFR calc non Af Amer: 41 mL/min — ABNORMAL LOW (ref >60)
Glucose, Bld: 141 mg/dL — ABNORMAL HIGH (ref 70–99)
Potassium: 3.5 mmol/L (ref 3.5–5.1)
Sodium: 136 mmol/L (ref 135–145)

## 2020-02-20 LAB — POCT I-STAT EG7
Acid-base deficit: 1 mmol/L (ref 0.0–2.0)
Bicarbonate: 24.6 mmol/L (ref 20.0–28.0)
Calcium, Ion: 1.19 mmol/L (ref 1.15–1.40)
HCT: 35 % — ABNORMAL LOW (ref 36.0–46.0)
Hemoglobin: 11.9 g/dL — ABNORMAL LOW (ref 12.0–15.0)
O2 Saturation: 53 %
Potassium: 3.4 mmol/L — ABNORMAL LOW (ref 3.5–5.1)
Sodium: 138 mmol/L (ref 135–145)
TCO2: 26 mmol/L (ref 22–32)
pCO2, Ven: 43.2 mmHg — ABNORMAL LOW (ref 44.0–60.0)
pH, Ven: 7.363 (ref 7.250–7.430)
pO2, Ven: 29 mmHg — CL (ref 32.0–45.0)

## 2020-02-20 LAB — CBC WITH DIFFERENTIAL/PLATELET
Abs Immature Granulocytes: 0.06 10*3/uL (ref 0.00–0.07)
Basophils Absolute: 0.1 10*3/uL (ref 0.0–0.1)
Basophils Relative: 1 %
Eosinophils Absolute: 0.8 10*3/uL — ABNORMAL HIGH (ref 0.0–0.5)
Eosinophils Relative: 8 %
HCT: 32.5 % — ABNORMAL LOW (ref 36.0–46.0)
Hemoglobin: 10.1 g/dL — ABNORMAL LOW (ref 12.0–15.0)
Immature Granulocytes: 1 %
Lymphocytes Relative: 21 %
Lymphs Abs: 2.2 10*3/uL (ref 0.7–4.0)
MCH: 24.8 pg — ABNORMAL LOW (ref 26.0–34.0)
MCHC: 31.1 g/dL (ref 30.0–36.0)
MCV: 79.7 fL — ABNORMAL LOW (ref 80.0–100.0)
Monocytes Absolute: 0.5 10*3/uL (ref 0.1–1.0)
Monocytes Relative: 5 %
Neutro Abs: 6.7 10*3/uL (ref 1.7–7.7)
Neutrophils Relative %: 64 %
Platelets: 281 10*3/uL (ref 150–400)
RBC: 4.08 MIL/uL (ref 3.87–5.11)
RDW: 23.5 % — ABNORMAL HIGH (ref 11.5–15.5)
WBC: 10.3 10*3/uL (ref 4.0–10.5)
nRBC: 0 % (ref 0.0–0.2)

## 2020-02-20 LAB — URINALYSIS, ROUTINE W REFLEX MICROSCOPIC
Bilirubin Urine: NEGATIVE
Glucose, UA: 150 mg/dL — AB
Hgb urine dipstick: NEGATIVE
Ketones, ur: NEGATIVE mg/dL
Leukocytes,Ua: NEGATIVE
Nitrite: NEGATIVE
Protein, ur: NEGATIVE mg/dL
Specific Gravity, Urine: 1.008 (ref 1.005–1.030)
pH: 6 (ref 5.0–8.0)

## 2020-02-20 LAB — RAPID URINE DRUG SCREEN, HOSP PERFORMED
Amphetamines: NOT DETECTED
Barbiturates: NOT DETECTED
Benzodiazepines: NOT DETECTED
Cocaine: NOT DETECTED
Opiates: NOT DETECTED
Tetrahydrocannabinol: NOT DETECTED

## 2020-02-20 LAB — TROPONIN I (HIGH SENSITIVITY)
Troponin I (High Sensitivity): 25 ng/L — ABNORMAL HIGH (ref <18)
Troponin I (High Sensitivity): 22 ng/L — ABNORMAL HIGH (ref <18)

## 2020-02-20 LAB — CBG MONITORING, ED: Glucose-Capillary: 136 mg/dL — ABNORMAL HIGH (ref 70–99)

## 2020-02-20 LAB — ETHANOL: Alcohol, Ethyl (B): 58 mg/dL — ABNORMAL HIGH (ref <10)

## 2020-02-20 LAB — POC SARS CORONAVIRUS 2 AG -  ED: SARS Coronavirus 2 Ag: NEGATIVE

## 2020-02-20 LAB — I-STAT BETA HCG BLOOD, ED (MC, WL, AP ONLY): I-stat hCG, quantitative: <5 m[IU]/mL (ref <5)

## 2020-02-20 MED ORDER — SODIUM CHLORIDE 0.9 % IV BOLUS
1000.0000 mL | Freq: Once | INTRAVENOUS | Status: AC
  Administered 2020-02-20: 1000 mL via INTRAVENOUS

## 2020-02-20 NOTE — Telephone Encounter (Signed)
[facility]ated [facility] General Follow Up Note  02/20/2020 Name: Jill Shaw MRN: [id number] DOB: [birth year] Jill Shaw is a 44 y.o. year old female who sees Barbette Merino, NP for primary care. LCSW was initially consulted to assess patient's access to community resources and assist the patient with financial Difficulties related tolow income and lack of insurance coverage. Patient was also in need of eye exam and eyeglasses resources  Ongoing Intervention: Patient experiencing vision impairment and financial difficulties related to low income and lack of insurance coverage. Today patient returned CSW's call from earlier this week. CSW informed patient that she is approved for a pair of free eyeglasses through the [employer] program. Patient will need to order her glasses online with the voucher provided. Patient to come to the office next week and CSW will assist patient with this.  Review of patient status, including review of consultants reports, relevant laboratory and other test results, and collaboration with appropriate care team members and the patient's provider was performed as part of comprehensive patient evaluation and provision of services.     Follow up Plan: 1. Patient to meet with CSW at office 02/26/20   Abigail Butts, LCSW [facility] [phone]

## 2020-02-20 NOTE — Discharge Instructions (Signed)
Your blood pressure was extremely low today.  I think this is due to the number of medications you are taking at home for blood pressure.  This can be dangerous, or life threatening.  You felt better after your blood pressure improved.  I would make the following recommendations for this week, until you can discuss with your doctor again.  1.) Take your metoprolol 100 mg in the MORNING, and your losartan 50 mg AT NIGHT. 2.) Stop taking your hydralazine medication for now, and do not restart it unless your primary care doctor tells you to do so. 3.)  Talk to your primary care provider TOMORROW (or at the soonest availability) about your blood pressure medications.  Your PCP should be making adjustments to your medications. 4.)  Check your blood pressure daily and keep a diary.  If your pressure seems very low or very high, take the pressure again on your other arm.  Sometimes the machine gets an inaccurate blood pressure. 5.)  Avoid taking muscle relaxers, opioids, or drinking alcohol.  All of these things can worsen your low blood pressure.

## 2020-02-20 NOTE — ED Triage Notes (Signed)
Pt. Arrived via [facility] stating that mom found patient unresponsive. Per EMS pt was unresponsive with snoring respiration at 18. CBG 140. Pt was last seen normal at 1115. ECG normal,  [facility]S VS- R-20, P-80, Palpated BP at 70.

## 2020-02-20 NOTE — ED Notes (Signed)
Patient up sitting on side of bed, stating she was ready to go. Patient stated she needed to use the bathroom, A+Ox4, walked to bathroom, gait steady.

## 2020-02-20 NOTE — ED Provider Notes (Signed)
[facility]PART[facility] Provider Note  CSN: [id number] Arrival date & time: 02/20/20  1221    History Chief Complaint  Patient presents with  . Loss of Consciousness  . Hypotension    Jill Shaw is a 44 y.o. female history of COPD, type 2 diabetes, morbid obesity, anemia, presented emergency department with unresponsiveness.  Patient arrived by EMS after his mother called 6, after returning home and finding the patient somnolent and unresponsive this afternoon.  Her mother last saw the patient 55 at which point she was behaving normally per her baseline.  Mother came back on the patient was snoring and unresponsive.  EMS reported the patient was also initially snoring respirations and somnolent.  Her CBG was 140.  Her blood pressure palpated was 70 systolic.  Of note, per medic review, the patient was just discharged in the hospital in April of this year (1 month ago) for very similar presentation.  She had been found unresponsive and somnolent by her mother and sent to the hospital.  She was diagnosed with acute metabolic encephalopathy and treated for an AKI.  At that time she had C, MRI, and EEG all without acute findings.  It was felt that her symptoms may have been related to use of muscle relaxers and narcotics.  Initially the patient was unable to provide additional history to me as she was somnolent.  Later during her ED evaluation, she was awake and cognizant and able to tell me about her medication regimen.  She takes metoprolol 100 mg in the morning, as well as losartan 50 mg in the morning "When my blood pressure is high."  She cannot clarify her BP parameters.  She is also prescribed hydralazine 50 mg TID which she takes "as needed."  She reports taking all 3 medications this morning.  She reports also taking zanaflex (one dose) this morning.  HPI     Past Medical History:  Diagnosis Date  . Anemia   . Arthritis    knees, hands  . Asthma    . COPD (chronic obstructive pulmonary disease) ([place])   . Diabetes mellitus without complication ([place])    type 2  . Dysfunctional uterine bleeding   . GERD (gastroesophageal reflux disease)   . Hypertension   . Neuromuscular disorder (H[facility])    neuropathy feet  . Seizures ([place]) 09/12/2017   pt states r/t stress and blood sugar - no meds last one 4 months ago, not seen neurologist  . Sickle cell trait ([place])   . Smoker   . Vitamin D deficiency 10/2019  . Wears glasses     Patient Active Problem List   Diagnosis Date Noted  . Acute encephalopathy 01/29/2020  . AKI (acute kidney injury) ([place]) 01/29/2020  . Symptomatic anemia 12/19/2019  . Elevated sed rate 11/26/2019  . Elevated C-reactive protein (CRP) 11/26/2019  . Hypoglycemia 02/07/2019  . Hypotension 02/07/2019  . Lactic acidosis 02/07/2019  . Right ankle pain 02/07/2019  . Chronic obstructive pulmonary disease ([place]) 02/05/2019  . Seizure ([place]) 07/31/2018  . Vitamin D deficiency 05/31/2017  . Gastroesophageal reflux disease 05/24/2017  . Abnormal uterine bleeding (AUB) 05/17/2017  . Anemia of chronic disease 04/06/2017  . Knee pain, chronic 03/27/2016  . Type 2 diabetes mellitus without complication, with long-term current use of insulin ([place]) 03/27/2016  . Essential hypertension 03/27/2016  . Morbid obesity ([place]) 03/27/2016  . Irritable bowel syndrome with constipation 03/27/2016  . Tobacco dependence 03/27/2016  . CKD (chronic kidney  disease) stage 2, GFR 60-89 ml/min 03/25/2016  . Arthropathy, lower leg 05/04/2013  . CTS (carpal tunnel syndrome) 05/04/2013  . Peripheral edema 05/04/2013  . Chronic pain 05/13/2012  . Diabetic gastroparesis ([place]) 11/06/2006  . Hot flashes 11/06/2006    Past Surgical History:  Procedure Laterality Date  . CESAREAN SECTION     x 1  . DILATION AND CURETTAGE OF UTERUS N/A 08/20/2019   Procedure: DILATATION AND CURETTAGE;  Surgeon: Emily Filbert, MD;  Location: [facility];  Service:  Gynecology;  Laterality: N/A;  . ENDOMETRIAL ABLATION N/A 08/20/2019   Procedure: [place] Ablation;  Surgeon: Emily Filbert, MD;  Location: [facility];  Service: Gynecology;  Laterality: N/A;  . EYE SURGERY Bilateral    laser right and cataract removed left eye  . RADIOLOGY WITH ANESTHESIA N/A 09/16/2019   Procedure: MRI WITH ANESTHESIA   L SPINE WITHOUT CONTRAST, T SPINE WITHOUT CONTRAST , CERVICAL WITHOUT CONTRAST;  Surgeon: Radiologist, Medication, MD;  Location: [facility];  Service: Radiology;  Laterality: N/A;  . TUBAL LIGATION    . UPPER GI ENDOSCOPY  07/2017     OB History    Gravida  5   Para      Term      Preterm      AB      Living  3     SAB      TAB      Ectopic      Multiple      Live Births              Family History  Problem Relation Age of Onset  . Diabetes Mother   . Hypertension Mother     Social History   Tobacco Use  . Smoking status: Current Every Day Smoker    Packs/day: 0.50    Years: 26.00    Pack years: 13.00    Types: Cigarettes  . Smokeless tobacco: Never Used  Substance Use Topics  . Alcohol use: No  . Drug use: No    Home Medications Prior to Admission medications   Medication Sig Start Date End Date Taking? Authorizing Provider  albuterol (PROVENTIL) (2.5 MG/3ML) 0.083% nebulizer solution Take 3 mLs (2.5 mg total) by nebulization every 6 (six) hours as needed for wheezing or shortness of breath. 12/02/19  Yes Vevelyn Francois, NP  albuterol (VENTOLIN HFA) 108 (90 Base) MCG/ACT inhaler Inhale 2 puffs into the lungs every 6 (six) hours as needed for wheezing or shortness of breath. 12/02/19  Yes Vevelyn Francois, NP  blood glucose meter kit and supplies KIT Dispense based on patient and insurance preference. Use up to four times daily as directed. (FOR ICD-9 250.00, 250.01). Patient taking differently: 1 each by Other route See admin instructions. Dispense based on patient and insurance preference. Use up to four times daily as directed.  (FOR ICD-9 250.00, 250.01). 02/12/20  Yes Vevelyn Francois, NP  Blood Glucose Monitoring Suppl (TRUE METRIX METER) w/Device KIT 1 each by Does not apply route 4 (four) times daily -  before meals and at bedtime. 01/21/18  Yes Dorena Dew, FNP  Blood Pressure Monitoring (BLOOD PRESSURE KIT) DEVI Check BP BID prn Patient taking differently: 1 each by Other route 2 (two) times daily as needed (blood pressure).  02/12/20  Yes King, Diona Foley, NP  budesonide-formoterol (SYMBICORT) 160-4.5 MCG/ACT inhaler INHALE 2 PUFFS INTO THE LUNGS 2 (TWO) TIMES DAILY. Patient taking differently: Inhale 2 puffs into the lungs  2 (two) times daily.  12/02/19  Yes Vevelyn Francois, NP  cetirizine (ZYRTEC) 10 MG tablet Take 1 tablet (10 mg total) by mouth daily. 12/02/19  Yes Vevelyn Francois, NP  Cholecalciferol (VITAMIN D-3) 125 MCG (5000 UT) TABS Take 1 tablet by mouth daily. Patient taking differently: Take 5,000 Units by mouth daily.  08/22/19  Yes Hilts, Legrand Como, MD  ferrous sulfate 325 (65 FE) MG tablet Take 1 tablet (325 mg total) by mouth 3 (three) times daily with meals. 12/02/19  Yes King, Diona Foley, NP  fluticasone (FLONASE) 50 MCG/ACT nasal spray Place 2 sprays into both nostrils daily. 11/24/19  Yes Vevelyn Francois, NP  furosemide (LASIX) 40 MG tablet Take 1 tablet (40 mg total) by mouth daily. 02/04/20  Yes Thurnell Lose, MD  Glucosamine Sulfate 1000 MG CAPS Take 1 capsule (1,000 mg total) by mouth 2 (two) times daily. 08/22/19  Yes Hilts, Legrand Como, MD  glucose blood (TRUE METRIX BLOOD GLUCOSE TEST) test strip Use as instructed Patient taking differently: 1 each by Other route See admin instructions. Use as instructed 01/21/18  Yes Dorena Dew, FNP  hydrocortisone 2.5 % cream Apply topically 2 (two) times daily. 10/14/19  Yes Dorena Dew, FNP  Insulin Lispro Prot & Lispro (HUMALOG MIX 75/25 KWIKPEN) (75-25) 100 UNIT/ML Kwikpen INJECT 100 UNITS EVERY 12 HOURS Patient taking differently: Inject 100 Units  into the skin every 12 (twelve) hours.  12/02/19  Yes King, Diona Foley, NP  Insulin Pen Needle (PEN NEEDLES) 30G X 5 MM MISC 1 Units by Does not apply route as directed. 12/02/19  Yes Vevelyn Francois, NP  Lido-Capsaicin-Men-Methyl Sal 0.5-0.035-5-20 % PTCH Apply 1 patch topically daily. 12/18/19 03/17/20 Yes King, Diona Foley, NP  Lidocaine-Capsaicin 4-0.1 % CREA Apply 1 application topically 4 (four) times daily as needed. Patient taking differently: Apply 1 application topically 4 (four) times daily as needed (skin).  11/24/19 02/22/20 Yes King, Diona Foley, NP  losartan (COZAAR) 50 MG tablet Take 1 tablet (50 mg total) by mouth daily. 02/06/20  Yes Thurnell Lose, MD  metoprolol succinate (TOPROL-XL) 100 MG 24 hr tablet Take 1 tablet (100 mg total) by mouth daily. Take with or immediately following a meal. 12/26/19  Yes Vevelyn Francois, NP  omeprazole (PRILOSEC) 40 MG capsule Take 1 capsule (40 mg total) by mouth daily. 12/02/19  Yes King, Diona Foley, NP  potassium chloride SA (KLOR-CON) 20 MEQ tablet Take 1 tablet (20 mEq total) by mouth daily. 02/04/20  Yes Thurnell Lose, MD  saxagliptin HCl (ONGLYZA) 2.5 MG TABS tablet Take 1 tablet (2.5 mg total) by mouth daily. 12/02/19  Yes King, Diona Foley, NP  tizanidine (ZANAFLEX) 6 MG capsule TAKE 1 CAPSULE BY MOUTH 3 TIMES DAILY. Patient taking differently: Take 6 mg by mouth 3 (three) times daily.  02/18/20  Yes Vevelyn Francois, NP  TRUEplus Lancets 28G MISC 1 each by Other route in the morning, at noon, in the evening, and at bedtime. 02/12/20  Yes [provider]  Turmeric 500 MG CAPS Take 500 mg by mouth 2 (two) times daily. 08/22/19  Yes Hilts, Legrand Como, MD  Vitamin D, Ergocalciferol, (DRISDOL) 1.25 MG (50000 UNIT) CAPS capsule Take 1 capsule (50,000 Units total) by mouth every 7 (seven) days. Patient taking differently: Take 50,000 Units by mouth every 7 (seven) days. Fridays 12/02/19  Yes Vevelyn Francois, NP  DULoxetine (CYMBALTA) 20 MG capsule Take 1  capsule (20 mg total) by mouth  daily. 02/12/20 04/12/20  Vevelyn Francois, NP  norethindrone (AYGESTIN) 5 MG tablet Take 2 tablets (10 mg total) by mouth every 8 (eight) hours. 12/31/19 01/30/20  Aletha Halim, MD  hydrALAZINE (APRESOLINE) 50 MG tablet Take 1 tablet (50 mg total) by mouth 3 (three) times daily. 02/03/20 02/20/20  Thurnell Lose, MD    Allergies    Ketoprofen, Aspirin, Gabapentin, Ibuprofen, Liraglutide, Naproxen, Omeprazole-sodium bicarbonate, Sulfa antibiotics, and Tramadol  Review of Systems   Review of Systems  Reason unable to perform ROS: ROS after patient's mental status improved.  Constitutional: Negative for chills and fever.  Respiratory: Negative for cough and shortness of breath.   Cardiovascular: Negative for chest pain and palpitations.  Gastrointestinal: Negative for abdominal pain, nausea and vomiting.  Genitourinary: Negative for dysuria and hematuria.  Musculoskeletal: Negative for arthralgias and back pain.  Skin: Negative for color change and rash.  Neurological: Positive for light-headedness. Negative for seizures and syncope.  Psychiatric/Behavioral: Negative for agitation and confusion.  All other systems reviewed and are negative.   Physical Exam Updated Vital Signs BP (!) 123/94   Pulse 80   Temp 97.7 F (36.5 C) (Oral)   Resp 20   SpO2 100%   Physical Exam Vitals and nursing note reviewed.  Constitutional:      General: She is not in acute distress.    Appearance: She is well-developed. She is obese.     Comments: Initially somnolent but arousable to voice, drowsy, can answer questions appropriately  HENT:     Head: Normocephalic and atraumatic.  Eyes:     Comments: Sclera injected bilaterally Pupils 3 mm and reactive bilaterally  Cardiovascular:     Rate and Rhythm: Normal rate and regular rhythm.     Pulses: Normal pulses.  Pulmonary:     Effort: Pulmonary effort is normal. No respiratory distress.     Breath sounds: Normal  breath sounds.  Abdominal:     Palpations: Abdomen is soft.     Tenderness: There is no abdominal tenderness.  Musculoskeletal:     Cervical back: Neck supple.  Skin:    General: Skin is warm and dry.  Neurological:     GCS: GCS eye subscore is 3. GCS verbal subscore is 4. GCS motor subscore is 6.     ED Results / Procedures / Treatments   Labs (all labs ordered are listed, but only abnormal results are displayed) Labs Reviewed  CBC WITH DIFFERENTIAL/PLATELET - Abnormal; Notable for the following components:      Result Value   Hemoglobin 10.1 (*)    HCT 32.5 (*)    MCV 79.7 (*)    MCH 24.8 (*)    RDW 23.5 (*)    Eosinophils Absolute 0.8 (*)    All other components within normal limits  BASIC METABOLIC PANEL - Abnormal; Notable for the following components:   Glucose, Bld 141 (*)    Creatinine, Ser 1.54 (*)    GFR calc non Af Amer 41 (*)    GFR calc Af Amer 47 (*)    All other components within normal limits  ETHANOL - Abnormal; Notable for the following components:   Alcohol, Ethyl (B) 58 (*)    All other components within normal limits  URINALYSIS, ROUTINE W REFLEX MICROSCOPIC - Abnormal; Notable for the following components:   Glucose, UA 150 (*)    All other components within normal limits  CBG MONITORING, ED - Abnormal; Notable for the following components:   Glucose-Capillary  136 (*)    All other components within normal limits  POCT I-STAT EG7 - Abnormal; Notable for the following components:   pCO2, Ven 43.2 (*)    pO2, Ven 29.0 (*)    Potassium 3.4 (*)    HCT 35.0 (*)    Hemoglobin 11.9 (*)    All other components within normal limits  TROPONIN I (HIGH SENSITIVITY) - Abnormal; Notable for the following components:   Troponin I (High Sensitivity) 25 (*)    All other components within normal limits  TROPONIN I (HIGH SENSITIVITY) - Abnormal; Notable for the following components:   Troponin I (High Sensitivity) 22 (*)    All other components within normal  limits  RAPID URINE DRUG SCREEN, HOSP PERFORMED  I-STAT VENOUS BLOOD GAS, ED  I-STAT BETA HCG BLOOD, ED ([facility], [facility], [facility] ONLY)  POC SARS CORONAVIRUS 2 AG -  ED    EKG EKG Interpretation  Date/Time:  Friday Feb 20 2020 12:43:09 EDT Ventricular Rate:  77 PR Interval:    QRS Duration: 86 QT Interval:  430 QTC Calculation: 487 R Axis:   55 Text Interpretation: Sinus rhythm Minimal ST depression, inferior leads Borderline prolonged QT interval No STEMI Confirmed by Octaviano Glow [id number]) on 02/20/2020 1:01:41 PM   Radiology DG Chest Portable 1 View  Result Date: 02/20/2020 CLINICAL DATA:  Unresponsive. EXAM: PORTABLE CHEST 1 VIEW COMPARISON:  01/29/2020. FINDINGS: Cardiomegaly. Mild pulmonary venous congestion and interstitial prominence. Mild CHF cannot be excluded. Low lung volumes. Mild atelectatic changes right mid lung. No acute bony abnormality. IMPRESSION: Cardiomegaly with mild pulmonary venous congestion. Mild bilateral interstitial prominence. Mild CHF cannot be excluded. Low lung volumes. Mild atelectatic changes right mid lung. Electronically Signed   By: Marcello Moores  Register   On: 02/20/2020 12:54    Procedures Procedures (including critical care time)  Medications Ordered in ED Medications  sodium chloride 0.9 % bolus 1,000 mL (0 mLs Intravenous Stopped 02/20/20 1321)    ED Course  I have reviewed the triage vital signs and the nursing notes.  Pertinent labs & imaging results that were available during my care of the patient were reviewed by me and considered in my medical decision making (see chart for details).  44 yo female presenting somnolent and hypotensive, with snoring respirations.  BP 00'L systolic on arrival, HR wnl.  No hypoxia.  She was arousable and answering questions appropriately with effort, the falling back asleep.  IV access was rapidly obtained and IVF was initiated, with rapid subsequent improvement in her BP.  After 3 hours in the ED her mental status  dramatically improved and she was awake, removed her IV's, and dressed, asking to leave.  I went to talk to her and explained my concern for her dangerously low blood pressure.  I advised further observation in the ED to ensure no drop, but she wanted to leave now.  I do believe this is related to polypharmacy (multiple BP medications plus her diuretic and her zanaflex, also noted her etoh level was elevated), and I explained this to her and her mother.  I offered them a medical regimen change in her discharge instructions.  I feel this is less likely PE clinically.  No tachypnea and tachycardia, and improvement of mental status with IVF and time here.  Also less likely ACS.  Her ECG was nonischemic.  Mild troponin elevation could be consistent with some demand ischemic from her hypotension, but delta trop was flat.  Less likely sepsis with no fever,  no leukocytosis, and rapid improvement without antibiotics.  Labs also notable for mild elevation in Cr, 1.5 today from 1.3 at discharge.  This may be related to overly aggressive BP control at home.  I advised they discuss this with this PCP as soon as possible.  Hgb was stable. UDS negative for opioids.  I personally reviewed her ECG, demonstrating NSR. I personally reviewed her chest xray demonstrating mild cardiomegaly and congestion, no focal infiltrates.  I did not feel that clinically this was a CHF exacerbation.  I did feel she was reasonable stable for discharge  Clinical Course as of Feb 20 1839  Fri Feb 20, 2020  1326 No significant elevation of pCO2 level.  Glucose 136   [MT]  1327 Satting 98% on room air, initial SBP 70's per nursing, I advised giving 1L IVF, reassess BP.  Patient still groggy but arousable and can converse coherently with me   [MT]  1328 Patient is prescribed metoprolol 100 mg daily, lasix 40 mg daily, losartan 50 mg daily, hydralazine 50 mg TID, and zanaflex TID   [MT]  1329 No hypoglycemia or ECG findings  consistent with beta blocker overdose   [MT]  1424 BP 107/55   [MT]  1533 Awake sitting up in bed, asking to go home   [MT]  1559 Patient awake, cognizant, well-appearing.  Sitting up in the bed.  Discussed my concerns for polypharmacy at home.  I believe she is on too many blood pressure medications.  She has been taking her metoprolol as well as her losartan daily.  She is she takes her hydralazine "as needed" and did take it this morning.  I will provide a regimen for this week in her discharge instructions, and advised she f/u with her PCP about this.  I explained th edangers of low BP   [MT]    Clinical Course User Index [MT] , Carola Rhine, MD    Final Clinical Impression(s) / ED Diagnoses Final diagnoses:  Hypotension due to drugs    Rx / DC Orders ED Discharge Orders    None       Langston Masker Carola Rhine, MD 02/20/20 [phone]

## 2020-02-23 ENCOUNTER — Ambulatory Visit: Payer: No Typology Code available for payment source

## 2020-02-25 ENCOUNTER — Ambulatory Visit: Payer: No Typology Code available for payment source | Admitting: Nurse Practitioner

## 2020-03-02 ENCOUNTER — Ambulatory Visit (INDEPENDENT_AMBULATORY_CARE_PROVIDER_SITE_OTHER): Payer: Self-pay

## 2020-03-02 ENCOUNTER — Other Ambulatory Visit: Payer: Self-pay

## 2020-03-02 ENCOUNTER — Emergency Department (HOSPITAL_COMMUNITY)
Admission: EM | Admit: 2020-03-02 | Discharge: 2020-03-03 | Disposition: A | Discharge status: Home / Self Care | Payer: Medicaid Other | Attending: Emergency Medicine | Admitting: Emergency Medicine

## 2020-03-02 VITALS — BP 171/81 | HR 89 | Wt 271.0 lb

## 2020-03-02 DIAGNOSIS — E1122 Type 2 diabetes mellitus with diabetic chronic kidney disease: Secondary | ICD-10-CM | POA: Insufficient documentation

## 2020-03-02 DIAGNOSIS — R400 Somnolence: Secondary | ICD-10-CM | POA: Diagnosis not present

## 2020-03-02 DIAGNOSIS — R7989 Other specified abnormal findings of blood chemistry: Secondary | ICD-10-CM | POA: Diagnosis not present

## 2020-03-02 DIAGNOSIS — J449 Chronic obstructive pulmonary disease, unspecified: Secondary | ICD-10-CM | POA: Insufficient documentation

## 2020-03-02 DIAGNOSIS — I129 Hypertensive chronic kidney disease with stage 1 through stage 4 chronic kidney disease, or unspecified chronic kidney disease: Secondary | ICD-10-CM | POA: Insufficient documentation

## 2020-03-02 DIAGNOSIS — Z79899 Other long term (current) drug therapy: Secondary | ICD-10-CM | POA: Insufficient documentation

## 2020-03-02 DIAGNOSIS — I959 Hypotension, unspecified: Secondary | ICD-10-CM

## 2020-03-02 DIAGNOSIS — Z5320 Procedure and treatment not carried out because of patient's decision for unspecified reasons: Secondary | ICD-10-CM | POA: Diagnosis not present

## 2020-03-02 DIAGNOSIS — Z794 Long term (current) use of insulin: Secondary | ICD-10-CM | POA: Diagnosis not present

## 2020-03-02 DIAGNOSIS — N182 Chronic kidney disease, stage 2 (mild): Secondary | ICD-10-CM | POA: Insufficient documentation

## 2020-03-02 DIAGNOSIS — N179 Acute kidney failure, unspecified: Secondary | ICD-10-CM | POA: Diagnosis not present

## 2020-03-02 DIAGNOSIS — D72829 Elevated white blood cell count, unspecified: Secondary | ICD-10-CM | POA: Insufficient documentation

## 2020-03-02 DIAGNOSIS — Z3202 Encounter for pregnancy test, result negative: Secondary | ICD-10-CM

## 2020-03-02 DIAGNOSIS — Z30013 Encounter for initial prescription of injectable contraceptive: Secondary | ICD-10-CM

## 2020-03-02 LAB — URINALYSIS, ROUTINE W REFLEX MICROSCOPIC
Bacteria, UA: NONE SEEN
Bilirubin Urine: NEGATIVE
Glucose, UA: >=500 mg/dL — AB
Hgb urine dipstick: NEGATIVE
Ketones, ur: NEGATIVE mg/dL
Leukocytes,Ua: NEGATIVE
Nitrite: NEGATIVE
Protein, ur: NEGATIVE mg/dL
Specific Gravity, Urine: 1.018 (ref 1.005–1.030)
pH: 5 (ref 5.0–8.0)

## 2020-03-02 LAB — CBC WITH DIFFERENTIAL/PLATELET
Abs Immature Granulocytes: 0.05 10*3/uL (ref 0.00–0.07)
Basophils Absolute: 0.1 10*3/uL (ref 0.0–0.1)
Basophils Relative: 1 %
Eosinophils Absolute: 0.4 10*3/uL (ref 0.0–0.5)
Eosinophils Relative: 3 %
HCT: 31 % — ABNORMAL LOW (ref 36.0–46.0)
Hemoglobin: 9.6 g/dL — ABNORMAL LOW (ref 12.0–15.0)
Immature Granulocytes: 0 %
Lymphocytes Relative: 22 %
Lymphs Abs: 2.7 10*3/uL (ref 0.7–4.0)
MCH: 24.3 pg — ABNORMAL LOW (ref 26.0–34.0)
MCHC: 31 g/dL (ref 30.0–36.0)
MCV: 78.5 fL — ABNORMAL LOW (ref 80.0–100.0)
Monocytes Absolute: 0.6 10*3/uL (ref 0.1–1.0)
Monocytes Relative: 5 %
Neutro Abs: 8.3 10*3/uL — ABNORMAL HIGH (ref 1.7–7.7)
Neutrophils Relative %: 69 %
Platelets: 303 10*3/uL (ref 150–400)
RBC: 3.95 MIL/uL (ref 3.87–5.11)
RDW: 22.6 % — ABNORMAL HIGH (ref 11.5–15.5)
WBC: 12 10*3/uL — ABNORMAL HIGH (ref 4.0–10.5)
nRBC: 0 % (ref 0.0–0.2)

## 2020-03-02 LAB — COMPREHENSIVE METABOLIC PANEL
ALT: 22 U/L (ref 0–44)
AST: 72 U/L — ABNORMAL HIGH (ref 15–41)
Albumin: 3.5 g/dL (ref 3.5–5.0)
Alkaline Phosphatase: 72 U/L (ref 38–126)
Anion gap: 15 (ref 5–15)
BUN: 14 mg/dL (ref 6–20)
CO2: 24 mmol/L (ref 22–32)
Calcium: 9.5 mg/dL (ref 8.9–10.3)
Chloride: 101 mmol/L (ref 98–111)
Creatinine, Ser: 1.98 mg/dL — ABNORMAL HIGH (ref 0.44–1.00)
GFR calc Af Amer: 35 mL/min — ABNORMAL LOW (ref >60)
GFR calc non Af Amer: 30 mL/min — ABNORMAL LOW (ref >60)
Glucose, Bld: 94 mg/dL (ref 70–99)
Potassium: 4.2 mmol/L (ref 3.5–5.1)
Sodium: 140 mmol/L (ref 135–145)
Total Bilirubin: 0.6 mg/dL (ref 0.3–1.2)
Total Protein: 6.7 g/dL (ref 6.5–8.1)

## 2020-03-02 LAB — ETHANOL: Alcohol, Ethyl (B): <10 mg/dL (ref <10)

## 2020-03-02 LAB — RAPID URINE DRUG SCREEN, HOSP PERFORMED
Amphetamines: NOT DETECTED
Barbiturates: NOT DETECTED
Benzodiazepines: NOT DETECTED
Cocaine: NOT DETECTED
Opiates: NOT DETECTED
Tetrahydrocannabinol: NOT DETECTED

## 2020-03-02 LAB — POCT PREGNANCY, URINE: Preg Test, Ur: NEGATIVE

## 2020-03-02 LAB — LACTIC ACID, PLASMA: Lactic Acid, Venous: 2.9 mmol/L (ref 0.5–1.9)

## 2020-03-02 MED ORDER — MEDROXYPROGESTERONE ACETATE 150 MG/ML IM SUSP
150.0000 mg | Freq: Once | INTRAMUSCULAR | Status: AC
  Administered 2020-03-02: 150 mg via INTRAMUSCULAR

## 2020-03-02 MED ORDER — NALOXONE HCL 0.4 MG/ML IJ SOLN
INTRAMUSCULAR | Status: AC
  Filled 2020-03-02: qty 1

## 2020-03-02 MED ORDER — NALOXONE HCL 2 MG/2ML IJ SOSY
PREFILLED_SYRINGE | INTRAMUSCULAR | Status: AC
  Filled 2020-03-02: qty 2

## 2020-03-02 MED ORDER — SODIUM CHLORIDE 0.9 % IV BOLUS
1000.0000 mL | Freq: Once | INTRAVENOUS | Status: AC
  Administered 2020-03-02: 1000 mL via INTRAVENOUS

## 2020-03-02 NOTE — ED Provider Notes (Signed)
[facility] EMERGENCY DEPARTMENT Provider Note   CSN: [id number] Arrival date & time: 03/02/20  2033     History No chief complaint on file.   Jill Shaw is a 44 y.o. female.  She has a history of COPD diabetes hypertension.  Reportedly went to [facility] today for routine checkup and was hypertensive.  Received the Depo-Provera shot.  This evening began feeling like her blood pressure was low.  EMS found her to be lethargic.  Low blood pressure.  Transported here.  Patient denies any complaint other than some chronic tingling in her lower extremities.  She denies overdosing on any medication.  No chest pain headache abdominal pain vomiting diarrhea.  The history is provided by the patient and the EMS personnel.  Altered Mental Status Presenting symptoms: lethargy   Severity:  Moderate Most recent episode:  Today Episode history:  Single Timing:  Constant Progression:  Unchanged Chronicity:  Recurrent Context: not head injury   Associated symptoms: no abdominal pain, no difficulty breathing, no fever, no headaches, no nausea, no rash and no vomiting        Past Medical History:  Diagnosis Date  . Anemia   . Arthritis    knees, hands  . Asthma   . COPD (chronic obstructive pulmonary disease) ([place])   . Diabetes mellitus without complication ([place])    type 2  . Dysfunctional uterine bleeding   . GERD (gastroesophageal reflux disease)   . Hypertension   . Neuromuscular disorder (HCC)    neuropathy feet  . Seizures ([place]) 09/12/2017   pt states r/t stress and blood sugar - no meds last one 4 months ago, not seen neurologist  . Sickle cell trait ([place])   . Smoker   . Vitamin D deficiency 10/2019  . Wears glasses     Patient Active Problem List   Diagnosis Date Noted  . Acute encephalopathy 01/29/2020  . AKI (acute kidney injury) ([place]) 01/29/2020  . Symptomatic anemia 12/19/2019  . Elevated sed rate 11/26/2019  . Elevated C-reactive protein (CRP)  11/26/2019  . Hypoglycemia 02/07/2019  . Hypotension 02/07/2019  . Lactic acidosis 02/07/2019  . Right ankle pain 02/07/2019  . Chronic obstructive pulmonary disease ([place]) 02/05/2019  . Seizure ([place]) 07/31/2018  . Vitamin D deficiency 05/31/2017  . Gastroesophageal reflux disease 05/24/2017  . Abnormal uterine bleeding (AUB) 05/17/2017  . Anemia of chronic disease 04/06/2017  . Knee pain, chronic 03/27/2016  . Type 2 diabetes mellitus without complication, with long-term current use of insulin ([place]) 03/27/2016  . Essential hypertension 03/27/2016  . Morbid obesity ([place]) 03/27/2016  . Irritable bowel syndrome with constipation 03/27/2016  . Tobacco dependence 03/27/2016  . CKD (chronic kidney disease) stage 2, GFR 60-89 ml/min 03/25/2016  . Arthropathy, lower leg 05/04/2013  . CTS (carpal tunnel syndrome) 05/04/2013  . Peripheral edema 05/04/2013  . Chronic pain 05/13/2012  . Diabetic gastroparesis ([place]) 11/06/2006  . Hot flashes 11/06/2006    Past Surgical History:  Procedure Laterality Date  . CESAREAN SECTION     x 1  . DILATION AND CURETTAGE OF UTERUS N/A 08/20/2019   Procedure: DILATATION AND CURETTAGE;  Surgeon: Emily Filbert, MD;  Location: [place];  Service: Gynecology;  Laterality: N/A;  . ENDOMETRIAL ABLATION N/A 08/20/2019   Procedure: [place] Ablation;  Surgeon: Emily Filbert, MD;  Location: [place];  Service: Gynecology;  Laterality: N/A;  . EYE SURGERY Bilateral    laser right and cataract removed left eye  .  RADIOLOGY WITH ANESTHESIA N/A 09/16/2019   Procedure: MRI WITH ANESTHESIA   L SPINE WITHOUT CONTRAST, T SPINE WITHOUT CONTRAST , CERVICAL WITHOUT CONTRAST;  Surgeon: Radiologist, Medication, MD;  Location: [place];  Service: Radiology;  Laterality: N/A;  . TUBAL LIGATION    . UPPER GI ENDOSCOPY  07/2017     OB History    Gravida  5   Para      Term      Preterm      AB      Living  3     SAB      TAB      Ectopic      Multiple      Live Births               Family History  Problem Relation Age of Onset  . Diabetes Mother   . Hypertension Mother     Social History   Tobacco Use  . Smoking status: Current Every Day Smoker    Packs/day: 0.50    Years: 26.00    Pack years: 13.00    Types: Cigarettes  . Smokeless tobacco: Never Used  Substance Use Topics  . Alcohol use: No  . Drug use: No    Home Medications Prior to Admission medications   Medication Sig Start Date End Date Taking? Authorizing Provider  albuterol (PROVENTIL) (2.5 MG/3ML) 0.083% nebulizer solution Take 3 mLs (2.5 mg total) by nebulization every 6 (six) hours as needed for wheezing or shortness of breath. 12/02/19   Vevelyn Francois, NP  albuterol (VENTOLIN HFA) 108 (90 Base) MCG/ACT inhaler Inhale 2 puffs into the lungs every 6 (six) hours as needed for wheezing or shortness of breath. 12/02/19   Vevelyn Francois, NP  blood glucose meter kit and supplies KIT Dispense based on patient and insurance preference. Use up to four times daily as directed. (FOR ICD-9 250.00, 250.01). Patient taking differently: 1 each by Other route See admin instructions. Dispense based on patient and insurance preference. Use up to four times daily as directed. (FOR ICD-9 250.00, 250.01). 02/12/20   Vevelyn Francois, NP  Blood Glucose Monitoring Suppl (TRUE METRIX METER) w/Device KIT 1 each by Does not apply route 4 (four) times daily -  before meals and at bedtime. 01/21/18   Dorena Dew, FNP  Blood Pressure Monitoring (BLOOD PRESSURE KIT) DEVI Check BP BID prn Patient taking differently: 1 each by Other route 2 (two) times daily as needed (blood pressure).  02/12/20   Vevelyn Francois, NP  budesonide-formoterol (SYMBICORT) 160-4.5 MCG/ACT inhaler INHALE 2 PUFFS INTO THE LUNGS 2 (TWO) TIMES DAILY. Patient taking differently: Inhale 2 puffs into the lungs 2 (two) times daily.  12/02/19   Vevelyn Francois, NP  cetirizine (ZYRTEC) 10 MG tablet Take 1 tablet (10 mg total) by mouth daily.  12/02/19   Vevelyn Francois, NP  Cholecalciferol (VITAMIN D-3) 125 MCG (5000 UT) TABS Take 1 tablet by mouth daily. Patient taking differently: Take 5,000 Units by mouth daily.  08/22/19   Hilts, Legrand Como, MD  DULoxetine (CYMBALTA) 20 MG capsule Take 1 capsule (20 mg total) by mouth daily. 02/12/20 04/12/20  Vevelyn Francois, NP  ferrous sulfate 325 (65 FE) MG tablet Take 1 tablet (325 mg total) by mouth 3 (three) times daily with meals. 12/02/19   Vevelyn Francois, NP  fluticasone (FLONASE) 50 MCG/ACT nasal spray Place 2 sprays into both nostrils daily. 11/24/19   Vevelyn Francois,  NP  furosemide (LASIX) 40 MG tablet Take 1 tablet (40 mg total) by mouth daily. 02/04/20   Thurnell Lose, MD  Glucosamine Sulfate 1000 MG CAPS Take 1 capsule (1,000 mg total) by mouth 2 (two) times daily. 08/22/19   Hilts, Legrand Como, MD  glucose blood (TRUE METRIX BLOOD GLUCOSE TEST) test strip Use as instructed Patient taking differently: 1 each by Other route See admin instructions. Use as instructed 01/21/18   Dorena Dew, FNP  hydrocortisone 2.5 % cream Apply topically 2 (two) times daily. 10/14/19   Dorena Dew, FNP  Insulin Lispro Prot & Lispro (HUMALOG MIX 75/25 KWIKPEN) (75-25) 100 UNIT/ML Kwikpen INJECT 100 UNITS EVERY 12 HOURS Patient taking differently: Inject 100 Units into the skin every 12 (twelve) hours.  12/02/19   Vevelyn Francois, NP  Insulin Pen Needle (PEN NEEDLES) 30G X 5 MM MISC 1 Units by Does not apply route as directed. 12/02/19   Vevelyn Francois, NP  Lido-Capsaicin-Men-Methyl Sal 0.5-0.035-5-20 % PTCH Apply 1 patch topically daily. Patient not taking: Reported on 03/02/2020 12/18/19 03/17/20  Vevelyn Francois, NP  losartan (COZAAR) 50 MG tablet Take 1 tablet (50 mg total) by mouth daily. 02/06/20   Thurnell Lose, MD  metoprolol succinate (TOPROL-XL) 100 MG 24 hr tablet Take 1 tablet (100 mg total) by mouth daily. Take with or immediately following a meal. 12/26/19   Vevelyn Francois, NP  norethindrone  (AYGESTIN) 5 MG tablet Take 2 tablets (10 mg total) by mouth every 8 (eight) hours. Patient not taking: Reported on 03/02/2020 12/31/19 03/02/20  Aletha Halim, MD  omeprazole (PRILOSEC) 40 MG capsule Take 1 capsule (40 mg total) by mouth daily. 12/02/19   Vevelyn Francois, NP  potassium chloride SA (KLOR-CON) 20 MEQ tablet Take 1 tablet (20 mEq total) by mouth daily. 02/04/20   Thurnell Lose, MD  saxagliptin HCl (ONGLYZA) 2.5 MG TABS tablet Take 1 tablet (2.5 mg total) by mouth daily. 12/02/19   Vevelyn Francois, NP  tizanidine (ZANAFLEX) 6 MG capsule TAKE 1 CAPSULE BY MOUTH 3 TIMES DAILY. Patient taking differently: Take 6 mg by mouth 3 (three) times daily.  02/18/20   Vevelyn Francois, NP  TRUEplus Lancets 28G MISC 1 each by Other route in the morning, at noon, in the evening, and at bedtime. 02/12/20   [provider]  Turmeric 500 MG CAPS Take 500 mg by mouth 2 (two) times daily. 08/22/19   Hilts, Legrand Como, MD  Vitamin D, Ergocalciferol, (DRISDOL) 1.25 MG (50000 UNIT) CAPS capsule Take 1 capsule (50,000 Units total) by mouth every 7 (seven) days. Patient taking differently: Take 50,000 Units by mouth every 7 (seven) days. Fridays 12/02/19   Vevelyn Francois, NP  hydrALAZINE (APRESOLINE) 50 MG tablet Take 1 tablet (50 mg total) by mouth 3 (three) times daily. 02/03/20 02/20/20  Thurnell Lose, MD    Allergies    Ketoprofen, Aspirin, Gabapentin, Ibuprofen, Liraglutide, Naproxen, Omeprazole-sodium bicarbonate, Sulfa antibiotics, and Tramadol  Review of Systems   Review of Systems  Constitutional: Positive for fatigue. Negative for fever.  HENT: Negative for sore throat.   Eyes: Negative for visual disturbance.  Respiratory: Negative for shortness of breath.   Cardiovascular: Negative for chest pain.  Gastrointestinal: Negative for abdominal pain, nausea and vomiting.  Genitourinary: Negative for dysuria.  Musculoskeletal: Negative for neck pain.  Skin: Negative for rash.  Neurological:  Negative for headaches.    Physical Exam Updated Vital Signs BP (!) 86/56 (  BP Location: Left Wrist)   Pulse 79   Temp (!) 97.1 F (36.2 C) (Axillary)   Resp (!) 25   SpO2 100%   Physical Exam Vitals and nursing note reviewed.  Constitutional:      General: She is not in acute distress.    Appearance: She is well-developed. She is obese.  HENT:     Head: Normocephalic and atraumatic.  Eyes:     Conjunctiva/sclera: Conjunctivae normal.  Cardiovascular:     Rate and Rhythm: Normal rate and regular rhythm.     Heart sounds: No murmur.  Pulmonary:     Effort: Pulmonary effort is normal. No respiratory distress.     Breath sounds: Normal breath sounds.  Abdominal:     Palpations: Abdomen is soft.     Tenderness: There is no abdominal tenderness.  Musculoskeletal:        General: No deformity or signs of injury. Normal range of motion.     Cervical back: Neck supple.  Skin:    General: Skin is warm and dry.     Capillary Refill: Capillary refill takes less than 2 seconds.  Neurological:     General: No focal deficit present.     Mental Status: She is easily aroused.     Comments: She is quiet, arousable to voice and answers questions.  Moving all extremities.  Psychiatric:        Behavior: Behavior is cooperative.     ED Results / Procedures / Treatments   Labs (all labs ordered are listed, but only abnormal results are displayed) Labs Reviewed  COMPREHENSIVE METABOLIC PANEL - Abnormal; Notable for the following components:      Result Value   Creatinine, Ser 1.98 (*)    AST 72 (*)    GFR calc non Af Amer 30 (*)    GFR calc Af Amer 35 (*)    All other components within normal limits  CBC WITH DIFFERENTIAL/PLATELET - Abnormal; Notable for the following components:   WBC 12.0 (*)    Hemoglobin 9.6 (*)    HCT 31.0 (*)    MCV 78.5 (*)    MCH 24.3 (*)    RDW 22.6 (*)    Neutro Abs 8.3 (*)    All other components within normal limits  LACTIC ACID, PLASMA -  Abnormal; Notable for the following components:   Lactic Acid, Venous 2.9 (*)    All other components within normal limits  URINALYSIS, ROUTINE W REFLEX MICROSCOPIC - Abnormal; Notable for the following components:   Glucose, UA >=500 (*)    All other components within normal limits  ETHANOL  LACTIC ACID, PLASMA  RAPID URINE DRUG SCREEN, HOSP PERFORMED    EKG EKG Interpretation  Date/Time:  Tuesday Mar 02 2020 20:43:08 EDT Ventricular Rate:  79 PR Interval:    QRS Duration: 90 QT Interval:  414 QTC Calculation: 475 R Axis:   48 Text Interpretation: Sinus rhythm Minimal ST depression, inferior leads No significant change since prior 5/21 Confirmed by Aletta Edouard [phone]) on 03/02/2020 8:47:01 PM   Radiology No results found.  Procedures Procedures (including critical care time)  Medications Ordered in ED Medications  sodium chloride 0.9 % bolus 1,000 mL (0 mLs Intravenous Stopped 03/03/20 0026)  sodium chloride 0.9 % bolus 1,000 mL (0 mLs Intravenous Stopped 03/03/20 0026)    ED Course  I have reviewed the triage vital signs and the nursing notes.  Pertinent labs & imaging results that were available during my  care of the patient were reviewed by me and considered in my medical decision making (see chart for details).    MDM Rules/Calculators/A&P                     This patient complains of lethargy and low blood pressure; this involves an extensive number of treatment Options and is a complaint that carries with it a high risk of complications and Morbidity. The differential includes sepsis, hypovolemia ingestion, polypharmacy, intoxication  I ordered, reviewed and interpreted labs, which included chemistry with elevated creatinine of 1.98 and elevated lactate, elevated white blood cell count I ordered medication IV fluids Additional history obtained from patient's family member Previous records obtained and reviewed in epic including last few admissions with  similar presentations.  After the interventions stated above, I reevaluated the patient and found patient's mental status to be completely clear and blood pressure improved.  She denies any other medication ingestion and thinks the Depo-Provera shot made her hypotensive.  No other signs of allergic reaction.  Signed out to Dr. Leonides Schanz for rechecking on her lactate after her last fluid bolus.   Final Clinical Impression(s) / ED Diagnoses Final diagnoses:  Hypotension, unspecified hypotension type  Somnolence  Leukocytosis, unspecified type  Elevated lactic acid level  AKI (acute kidney injury) [facility])    Rx / DC Orders ED Discharge Orders    None       Hayden Rasmussen, MD 03/03/20 [phone]

## 2020-03-02 NOTE — Progress Notes (Signed)
Pt here to begin Depo-Provera. UPT negative. Pt denies having any unprotected intercourse in the past 2 weeks.   Pt reports continued severe abdominal cramping and heavy bleeding. Pt asks about pain control; recommended Tylenol and heating pad as patient is allergic to Naproxen and Ibuprofen. Pt's BP is elevated at visit today: 171/81. Recent changes made to pt's BP medication due to recent ED visit for hypotension. BP is managed by PCP; pt has f/u appt for BP next week. Denies any s/s of htn. Pt agrees to check BP tomorrow AM and contact primary care office if still elevated.   Pt states she is concerned because following ablation procedure she has been "leaking." Pt states she is leaking a clear fluid vaginally, pt is sure this is not urine. Reports waking up with underwear and pants very wet. Pt has history of depression; followed up with patient regarding mental health. Pt feels the bleeding and pain is negatively affecting her mental health; denies SI or thoughts of harm. Pt plans to follow up with PCP next week regarding depression medication management.   Injection administered without complication. Patient will return in 1 month for in-person f/u with [name], MD and in 3 months for next injection.  Marjo Bicker, RN 03/02/2020  5:15 PM

## 2020-03-02 NOTE — ED Provider Notes (Signed)
12:00 AM  Assumed care.  Patient here for hypotension likely secondary to polypharmacy.  Has history of the same.  Patient has history of chronic kidney disease with waxing and waning creatinine that appears to be have progressively worsening over the past month.  She has received 2 L of IV fluids here.  Plan is to recheck creatinine as well as slightly elevated lactate here.  Previous provider had low suspicion for sepsis.  Blood pressures have improved with hydration.  12:20 AM  On reexamination, patient appears nontoxic.  I am concerned that she has been tachypneic, hypotensive and has a leukocytosis with left shift.  I do not think that we have successfully ruled out sepsis especially given vital sign changes, leukocytosis and elevated lactate.  Have recommended rectal temperature which she adamantly declines.  I recommended that we wait to reevaluate her at her lactic discussed that she may need antibiotics and admission.  Her blood pressure has improved with IV fluids.  We also discussed that her creatinine had doubled over the past month.  Patient becomes adamant that she is not going to stay for further work-up.  She states she feels fine and will follow up with her doctor tomorrow.  She denies fevers, cough, vomiting, diarrhea, pain.  I have discussed with patient that I would prefer to proceed with further work-up to rule out life-threatening illness given she was so hypotensive on arrival.  She refuses this stating that she wants to leave.  We have discussed leaving AGAINST MEDICAL ADVICE and that we could be missing life-threatening illness that needs further treatment and could lead to but not limited to worsening symptoms, severe and permanent disability and even death.  Patient's family at bedside.  Patient verbalized understanding of these risks and still expressed wishes to be discharged.  She will sign out AMA and follow-up with her PCP in the morning.  I reviewed all nursing notes and  pertinent previous records as available.  I have reviewed and interpreted any EKGs, lab and urine results, imaging (as available).    Gitel Beste, Layla Maw, DO 03/03/20 0021

## 2020-03-02 NOTE — Discharge Instructions (Addendum)
You were seen in the emergency department for evaluation of being less responsive and with low blood pressure.  Your symptoms improved with IV fluids and time.  This may be related to your Depo shot or other medications that you take.  Please drink plenty of fluids and follow-up with your doctor.  Return to the emergency department for any worsening or concerning symptoms  We have also not ruled out sepsis as a cause of your hypotension and abnormal labs today.  We have recommended further work-up which you have refused.  You have decided to leave AGAINST MEDICAL ADVICE.  If you begin to have worsening symptoms or would like further evaluation, please return to the emergency department immediately.

## 2020-03-03 ENCOUNTER — Encounter: Payer: Self-pay | Admitting: Cardiovascular Disease

## 2020-03-03 ENCOUNTER — Ambulatory Visit (INDEPENDENT_AMBULATORY_CARE_PROVIDER_SITE_OTHER): Payer: Self-pay | Admitting: Cardiovascular Disease

## 2020-03-03 VITALS — BP 156/60 | HR 107 | Ht 64.0 in | Wt 274.8 lb

## 2020-03-03 DIAGNOSIS — F17200 Nicotine dependence, unspecified, uncomplicated: Secondary | ICD-10-CM

## 2020-03-03 DIAGNOSIS — I10 Essential (primary) hypertension: Secondary | ICD-10-CM

## 2020-03-03 DIAGNOSIS — R400 Somnolence: Secondary | ICD-10-CM

## 2020-03-03 DIAGNOSIS — R0683 Snoring: Secondary | ICD-10-CM

## 2020-03-03 LAB — LACTIC ACID, PLASMA: Lactic Acid, Venous: 1.6 mmol/L (ref 0.5–1.9)

## 2020-03-03 NOTE — Assessment & Plan Note (Signed)
On inhaled bronchodilators

## 2020-03-03 NOTE — Patient Instructions (Signed)
Medication Instructions:  The current medical regimen is effective;  continue present plan and medications.  *If you need a refill on your cardiac medications before your next appointment, please call your pharmacy*   Testing/Procedures: Your physician has recommended that you have a sleep study. This test records several body functions during sleep, including: brain activity, eye movement, oxygen and carbon dioxide blood levels, heart rate and rhythm, breathing rate and rhythm, the flow of air through your mouth and nose, snoring, body muscle movements, and chest and belly movement.  Echocardiogram - Your physician has requested that you have an echocardiogram. Echocardiography is a painless test that uses sound waves to create images of your heart. It provides your doctor with information about the size and shape of your heart and how well your heart's chambers and valves are working. This procedure takes approximately one hour. There are no restrictions for this procedure. This will be performed at our [facility] location - [address], [address].    Follow-Up: At [facility], you and your health needs are our priority.  As part of our continuing mission to provide you with exceptional heart care, we have created designated Provider Care Teams.  These Care Teams include your primary Cardiologist (physician) and Advanced Practice Providers (APPs -  Physician Assistants and Nurse Practitioners) who all work together to provide you with the care you need, when you need it.  We recommend signing up for the patient portal called "MyChart".  Sign up information is provided on this After Visit Summary.  MyChart is used to connect with patients for Virtual Visits (Telemedicine).  Patients are able to view lab/test results, encounter notes, upcoming appointments, etc.  Non-urgent messages can be sent to your provider as well.   To learn more about what you can do with MyChart, go to  [email or web address].    Your next appointment:   2 months with APP (PA or NP) 6 months with Nanetta Batty, MD   Other Instructions PharmD appointment in 4 weeks- keep BP log for 30 days.

## 2020-03-03 NOTE — Assessment & Plan Note (Signed)
Ongoing tobacco abuse of a half a pack a day having smoked for the last 25 years recalcitrant resector modification.

## 2020-03-03 NOTE — Assessment & Plan Note (Signed)
Multiple episodes of hypotension leading to either ER admission or hospitalization probably related to polypharmacy

## 2020-03-03 NOTE — Assessment & Plan Note (Signed)
History of labile hypertension with blood pressure measured today 156/60.  She is had several ER visits and admissions for hypotension and hypovolemia.  She is on metoprolol, losartan and hydralazine.  Her mother checks her blood pressure at home.  It sounds like she takes her medicines as needed.  I am going to have her keep a blood pressure log and see a Pharm.D. in 1 month to review make further recommendations.

## 2020-03-03 NOTE — Progress Notes (Signed)
03/03/2020 Jill Shaw   1976-04-22  [id number]  Primary Physician Vevelyn Francois, NP Primary Cardiologist: Lorretta Harp MD Garret Reddish, [place], [place]  HPI:  Jill Shaw is a 44 y.o. morbidly overweight single African-American female mother of 3 children who does not work is accompanied by her mother Jill Shaw today.  She was referred by her PCP, Dionisio David NP for evaluation of dyspnea and tachycardia.  She does have a history of tobacco abuse smoking 1/2 pack a day for last 25 years recalcitrant risk factor modification.  She has diabetes and hypertension.  She is never had a heart attack or stroke.  She denies chest pain but does complain of dyspnea on exertion and tachycardia.  She has had hospitalization 01/29/2020 for 5 days for metabolic encephalopathy, hypotension and altered mental status with negative work-up.  She was seen in the ER recently with hypotension thought to be related to polypharmacy.  She is on muscle relaxants as well and has arthritis and peripheral neuropathy   Current Meds  Medication Sig  . albuterol (PROVENTIL) (2.5 MG/3ML) 0.083% nebulizer solution Take 3 mLs (2.5 mg total) by nebulization every 6 (six) hours as needed for wheezing or shortness of breath.  Marland Kitchen albuterol (VENTOLIN HFA) 108 (90 Base) MCG/ACT inhaler Inhale 2 puffs into the lungs every 6 (six) hours as needed for wheezing or shortness of breath.  . blood glucose meter kit and supplies KIT Dispense based on patient and insurance preference. Use up to four times daily as directed. (FOR ICD-9 250.00, 250.01). (Patient taking differently: 1 each by Other route See admin instructions. Dispense based on patient and insurance preference. Use up to four times daily as directed. (FOR ICD-9 250.00, 250.01).)  . Blood Glucose Monitoring Suppl (TRUE METRIX METER) w/Device KIT 1 each by Does not apply route 4 (four) times daily -  before meals and at bedtime.  . Blood Pressure Monitoring (BLOOD PRESSURE KIT)  DEVI Check BP BID prn (Patient taking differently: 1 each by Other route 2 (two) times daily as needed (blood pressure). )  . budesonide-formoterol (SYMBICORT) 160-4.5 MCG/ACT inhaler INHALE 2 PUFFS INTO THE LUNGS 2 (TWO) TIMES DAILY. (Patient taking differently: Inhale 2 puffs into the lungs 2 (two) times daily. )  . cetirizine (ZYRTEC) 10 MG tablet Take 1 tablet (10 mg total) by mouth daily.  . Cholecalciferol (VITAMIN D-3) 125 MCG (5000 UT) TABS Take 1 tablet by mouth daily. (Patient taking differently: Take 5,000 Units by mouth daily. )  . DULoxetine (CYMBALTA) 20 MG capsule Take 1 capsule (20 mg total) by mouth daily.  . ferrous sulfate 325 (65 FE) MG tablet Take 1 tablet (325 mg total) by mouth 3 (three) times daily with meals.  . fluticasone (FLONASE) 50 MCG/ACT nasal spray Place 2 sprays into both nostrils daily.  . furosemide (LASIX) 40 MG tablet Take 1 tablet (40 mg total) by mouth daily.  . Glucosamine Sulfate 1000 MG CAPS Take 1 capsule (1,000 mg total) by mouth 2 (two) times daily.  Marland Kitchen glucose blood (TRUE METRIX BLOOD GLUCOSE TEST) test strip Use as instructed (Patient taking differently: 1 each by Other route See admin instructions. Use as instructed)  . hydrocortisone 2.5 % cream Apply topically 2 (two) times daily.  . Insulin Lispro Prot & Lispro (HUMALOG MIX 75/25 KWIKPEN) (75-25) 100 UNIT/ML Kwikpen INJECT 100 UNITS EVERY 12 HOURS (Patient taking differently: Inject 100 Units into the skin every 12 (twelve) hours. )  . Insulin Pen  Needle (PEN NEEDLES) 30G X 5 MM MISC 1 Units by Does not apply route as directed.  . Lido-Capsaicin-Men-Methyl Sal 0.5-0.035-5-20 % PTCH Apply 1 patch topically daily.  Marland Kitchen losartan (COZAAR) 50 MG tablet Take 1 tablet (50 mg total) by mouth daily.  . metoprolol succinate (TOPROL-XL) 100 MG 24 hr tablet Take 1 tablet (100 mg total) by mouth daily. Take with or immediately following a meal.  . omeprazole (PRILOSEC) 40 MG capsule Take 1 capsule (40 mg total) by  mouth daily.  . potassium chloride SA (KLOR-CON) 20 MEQ tablet Take 1 tablet (20 mEq total) by mouth daily.  . saxagliptin HCl (ONGLYZA) 2.5 MG TABS tablet Take 1 tablet (2.5 mg total) by mouth daily.  . tizanidine (ZANAFLEX) 6 MG capsule TAKE 1 CAPSULE BY MOUTH 3 TIMES DAILY. (Patient taking differently: Take 6 mg by mouth 3 (three) times daily. )  . TRUEplus Lancets 28G MISC 1 each by Other route in the morning, at noon, in the evening, and at bedtime.  . Turmeric 500 MG CAPS Take 500 mg by mouth 2 (two) times daily.  . Vitamin D, Ergocalciferol, (DRISDOL) 1.25 MG (50000 UNIT) CAPS capsule Take 1 capsule (50,000 Units total) by mouth every 7 (seven) days. (Patient taking differently: Take 50,000 Units by mouth every Friday. )     Allergies  Allergen Reactions  . Ketoprofen Nausea And Vomiting  . Aspirin Nausea Only  . Gabapentin Nausea And Vomiting    upset stomach  . Ibuprofen Nausea And Vomiting  . Liraglutide Nausea And Vomiting  . Naproxen Nausea And Vomiting  . Omeprazole-Sodium Bicarbonate Nausea And Vomiting  . Sulfa Antibiotics Nausea And Vomiting  . Tramadol Nausea And Vomiting    stomach upset    Social History   Socioeconomic History  . Marital status: Legally Separated    Spouse name: Not on file  . Number of children: Not on file  . Years of education: Not on file  . Highest education level: Not on file  Occupational History  . Occupation: unemployed  Tobacco Use  . Smoking status: Current Every Day Smoker    Packs/day: 0.50    Years: 26.00    Pack years: 13.00    Types: Cigarettes  . Smokeless tobacco: Never Used  Substance and Sexual Activity  . Alcohol use: No  . Drug use: No  . Sexual activity: Not Currently    Birth control/protection: None  Other Topics Concern  . Not on file  Social History Narrative  . Not on file   Social Determinants of Health   Financial Resource Strain:   . Difficulty of Paying Living Expenses:   Food Insecurity: No  Food Insecurity  . Worried About Charity fundraiser in the Last Year: Never true  . Ran Out of Food in the Last Year: Never true  Transportation Needs: No Transportation Needs  . Lack of Transportation (Medical): No  . Lack of Transportation (Non-Medical): No  Physical Activity:   . Days of Exercise per Week:   . Minutes of Exercise per Session:   Stress:   . Feeling of Stress :   Social Connections:   . Frequency of Communication with Friends and Family:   . Frequency of Social Gatherings with Friends and Family:   . Attends Religious Services:   . Active Member of Clubs or Organizations:   . Attends Archivist Meetings:   Marland Kitchen Marital Status:   Intimate Partner Violence:   . Fear of Current  or Ex-Partner:   . Emotionally Abused:   Marland Kitchen Physically Abused:   . Sexually Abused:      Review of Systems: General: negative for chills, fever, night sweats or weight changes.  Cardiovascular: negative for chest pain, dyspnea on exertion, edema, orthopnea, palpitations, paroxysmal nocturnal dyspnea or shortness of breath Dermatological: negative for rash Respiratory: negative for cough or wheezing Urologic: negative for hematuria Abdominal: negative for nausea, vomiting, diarrhea, bright red blood per rectum, melena, or hematemesis Neurologic: negative for visual changes, syncope, or dizziness All other systems reviewed and are otherwise negative except as noted above.    Blood pressure (!) 156/60, pulse (!) 107, height '5\' 4"'$  (1.626 m), weight 274 lb 12.8 oz (124.6 kg), SpO2 99 %.  General appearance: alert and no distress Neck: no adenopathy, no carotid bruit, no JVD, supple, symmetrical, trachea midline and thyroid not enlarged, symmetric, no tenderness/mass/nodules Lungs: clear to auscultation bilaterally Heart: regular rate and rhythm, S1, S2 normal, no murmur, click, rub or gallop Extremities: extremities normal, atraumatic, no cyanosis or edema Pulses: 2+ and  symmetric Skin: Skin color, texture, turgor normal. No rashes or lesions Neurologic: Alert and oriented X 3, normal strength and tone. Normal symmetric reflexes. Normal coordination and gait  EKG sinus tachycardia 107 without ST or T wave changes.  I personally reviewed this EKG.  ASSESSMENT AND PLAN:   Essential hypertension History of labile hypertension with blood pressure measured today 156/60.  She is had several ER visits and admissions for hypotension and hypovolemia.  She is on metoprolol, losartan and hydralazine.  Her mother checks her blood pressure at home.  It sounds like she takes her medicines as needed.  I am going to have her keep a blood pressure log and see a Pharm.D. in 1 month to review make further recommendations.  Morbid obesity (HCC) BMI 47  Tobacco dependence Ongoing tobacco abuse of a half a pack a day having smoked for the last 25 years recalcitrant resector modification.  Chronic obstructive pulmonary disease (HCC) On inhaled bronchodilators  Hypotension Multiple episodes of hypotension leading to either ER admission or hospitalization probably related to polypharmacy      Lorretta Harp MD [facility], [facility] 03/03/2020 5:06 PM

## 2020-03-03 NOTE — Assessment & Plan Note (Signed)
BMI 47 

## 2020-03-03 NOTE — Progress Notes (Signed)
Patient was assessed and managed by nursing staff during this encounter. I have reviewed the chart and agree with the documentation and plan. I have also made any necessary editorial changes.  Letitia Sabala, MD 03/03/2020 1:28 PM   

## 2020-03-03 NOTE — ED Notes (Signed)
Discharge instructions discussed with pt. Pt verbalized understanding with no questions at this time. Family at bedside.    Pt aware she is leaving against medical advise at this time. AMA form complete.

## 2020-03-04 ENCOUNTER — Other Ambulatory Visit: Payer: Self-pay | Admitting: Nurse Practitioner

## 2020-03-04 ENCOUNTER — Other Ambulatory Visit: Payer: Self-pay | Admitting: Family Medicine

## 2020-03-04 ENCOUNTER — Encounter: Payer: Self-pay | Admitting: Clinical

## 2020-03-04 DIAGNOSIS — L309 Dermatitis, unspecified: Secondary | ICD-10-CM

## 2020-03-04 MED FILL — METOPROLOL SUCCINATE ER 100: 100 | 30 days supply | Qty: 30 | Fill #2

## 2020-03-04 MED FILL — ?FUROSEMIDE 4OMG TABLETS: 40 | 30 days supply | Qty: 30 | Fill #0

## 2020-03-04 NOTE — Telephone Encounter (Signed)
Refill request for Hydrocortizone 2.5% cream

## 2020-03-04 NOTE — Progress Notes (Signed)
[facility] General Follow Up Note  03/04/2020 Name: Jill Shaw MRN: [id number] DOB: 27-May-1976 Jill Shaw is a 44 y.o. year old female who sees Vevelyn Francois, NP for primary care. LCSW was initially consulted to to assess patient's access to community resources and assist the patient withfinancial Difficulties related tolow income and lack of insurance coverage.Patient was also in need of eye exam and eyeglasses resources.  Ongoing Intervention: Patient experiencing vision impairment and financial difficulties related to low income and lack of insurance coverage. Today CSW and patient met at office. Patient was approved for free eyeglasses through the [employer] program. She had a prescription from recent eye exam. Assisted patient in ordering the glasses today. Glasses will be mailed to patient.  Review of patient status, including review of consultants reports, relevant laboratory and other test results, and collaboration with appropriate care team members and the patient's provider was performed as part of comprehensive patient evaluation and provision of services.     Follow up Plan: 1. CSW available from clinic as needed.   Estanislado Emms, [facility] [phone]

## 2020-03-05 MED FILL — HYDROCORTISONE 2.5% CREAM: 2.5 | 30 days supply | Qty: 30 | Fill #0

## 2020-03-07 ENCOUNTER — Encounter (HOSPITAL_COMMUNITY): Payer: Self-pay | Admitting: Emergency Medicine

## 2020-03-07 ENCOUNTER — Emergency Department (HOSPITAL_COMMUNITY): Payer: Medicaid Other

## 2020-03-07 ENCOUNTER — Inpatient Hospital Stay (HOSPITAL_COMMUNITY)
Admission: EM | Admit: 2020-03-07 | Discharge: 2020-03-10 | DRG: 392 | Disposition: A | Discharge status: Home / Self Care | Payer: Medicaid Other | Attending: Family Medicine | Admitting: Family Medicine

## 2020-03-07 ENCOUNTER — Other Ambulatory Visit: Payer: Self-pay

## 2020-03-07 DIAGNOSIS — R1033 Periumbilical pain: Secondary | ICD-10-CM | POA: Diagnosis present

## 2020-03-07 DIAGNOSIS — E1122 Type 2 diabetes mellitus with diabetic chronic kidney disease: Secondary | ICD-10-CM | POA: Diagnosis present

## 2020-03-07 DIAGNOSIS — Z886 Allergy status to analgesic agent status: Secondary | ICD-10-CM

## 2020-03-07 DIAGNOSIS — K760 Fatty (change of) liver, not elsewhere classified: Secondary | ICD-10-CM | POA: Diagnosis present

## 2020-03-07 DIAGNOSIS — K2970 Gastritis, unspecified, without bleeding: Principal | ICD-10-CM | POA: Diagnosis present

## 2020-03-07 DIAGNOSIS — Z6841 Body Mass Index (BMI) 40.0 and over, adult: Secondary | ICD-10-CM

## 2020-03-07 DIAGNOSIS — N189 Chronic kidney disease, unspecified: Secondary | ICD-10-CM | POA: Diagnosis present

## 2020-03-07 DIAGNOSIS — I5032 Chronic diastolic (congestive) heart failure: Secondary | ICD-10-CM | POA: Diagnosis present

## 2020-03-07 DIAGNOSIS — Z7951 Long term (current) use of inhaled steroids: Secondary | ICD-10-CM

## 2020-03-07 DIAGNOSIS — Z888 Allergy status to other drugs, medicaments and biological substances status: Secondary | ICD-10-CM

## 2020-03-07 DIAGNOSIS — F17210 Nicotine dependence, cigarettes, uncomplicated: Secondary | ICD-10-CM | POA: Diagnosis present

## 2020-03-07 DIAGNOSIS — F17200 Nicotine dependence, unspecified, uncomplicated: Secondary | ICD-10-CM | POA: Diagnosis present

## 2020-03-07 DIAGNOSIS — R112 Nausea with vomiting, unspecified: Secondary | ICD-10-CM | POA: Diagnosis present

## 2020-03-07 DIAGNOSIS — N938 Other specified abnormal uterine and vaginal bleeding: Secondary | ICD-10-CM | POA: Diagnosis present

## 2020-03-07 DIAGNOSIS — E1165 Type 2 diabetes mellitus with hyperglycemia: Secondary | ICD-10-CM | POA: Diagnosis not present

## 2020-03-07 DIAGNOSIS — F329 Major depressive disorder, single episode, unspecified: Secondary | ICD-10-CM | POA: Diagnosis present

## 2020-03-07 DIAGNOSIS — R1084 Generalized abdominal pain: Secondary | ICD-10-CM | POA: Diagnosis present

## 2020-03-07 DIAGNOSIS — Z882 Allergy status to sulfonamides status: Secondary | ICD-10-CM

## 2020-03-07 DIAGNOSIS — G8929 Other chronic pain: Secondary | ICD-10-CM | POA: Diagnosis present

## 2020-03-07 DIAGNOSIS — Z833 Family history of diabetes mellitus: Secondary | ICD-10-CM

## 2020-03-07 DIAGNOSIS — J449 Chronic obstructive pulmonary disease, unspecified: Secondary | ICD-10-CM | POA: Diagnosis present

## 2020-03-07 DIAGNOSIS — D509 Iron deficiency anemia, unspecified: Secondary | ICD-10-CM | POA: Diagnosis present

## 2020-03-07 DIAGNOSIS — N182 Chronic kidney disease, stage 2 (mild): Secondary | ICD-10-CM | POA: Diagnosis present

## 2020-03-07 DIAGNOSIS — R81 Glycosuria: Secondary | ICD-10-CM | POA: Diagnosis present

## 2020-03-07 DIAGNOSIS — I130 Hypertensive heart and chronic kidney disease with heart failure and stage 1 through stage 4 chronic kidney disease, or unspecified chronic kidney disease: Secondary | ICD-10-CM | POA: Diagnosis present

## 2020-03-07 DIAGNOSIS — M1990 Unspecified osteoarthritis, unspecified site: Secondary | ICD-10-CM | POA: Diagnosis present

## 2020-03-07 DIAGNOSIS — R52 Pain, unspecified: Secondary | ICD-10-CM

## 2020-03-07 DIAGNOSIS — E66813 Obesity, class 3: Secondary | ICD-10-CM | POA: Diagnosis present

## 2020-03-07 DIAGNOSIS — N939 Abnormal uterine and vaginal bleeding, unspecified: Secondary | ICD-10-CM | POA: Diagnosis present

## 2020-03-07 DIAGNOSIS — D573 Sickle-cell trait: Secondary | ICD-10-CM | POA: Diagnosis present

## 2020-03-07 DIAGNOSIS — E559 Vitamin D deficiency, unspecified: Secondary | ICD-10-CM | POA: Diagnosis present

## 2020-03-07 DIAGNOSIS — R000 Tachycardia, unspecified: Secondary | ICD-10-CM | POA: Diagnosis present

## 2020-03-07 DIAGNOSIS — I10 Essential (primary) hypertension: Secondary | ICD-10-CM | POA: Diagnosis present

## 2020-03-07 DIAGNOSIS — G40909 Epilepsy, unspecified, not intractable, without status epilepticus: Secondary | ICD-10-CM | POA: Diagnosis present

## 2020-03-07 DIAGNOSIS — D638 Anemia in other chronic diseases classified elsewhere: Secondary | ICD-10-CM | POA: Diagnosis present

## 2020-03-07 DIAGNOSIS — Z794 Long term (current) use of insulin: Secondary | ICD-10-CM

## 2020-03-07 DIAGNOSIS — R109 Unspecified abdominal pain: Secondary | ICD-10-CM | POA: Diagnosis present

## 2020-03-07 DIAGNOSIS — Z79899 Other long term (current) drug therapy: Secondary | ICD-10-CM

## 2020-03-07 DIAGNOSIS — K219 Gastro-esophageal reflux disease without esophagitis: Secondary | ICD-10-CM | POA: Diagnosis present

## 2020-03-07 DIAGNOSIS — E119 Type 2 diabetes mellitus without complications: Secondary | ICD-10-CM

## 2020-03-07 DIAGNOSIS — Z20822 Contact with and (suspected) exposure to covid-19: Secondary | ICD-10-CM | POA: Diagnosis present

## 2020-03-07 DIAGNOSIS — R569 Unspecified convulsions: Secondary | ICD-10-CM

## 2020-03-07 DIAGNOSIS — Z9851 Tubal ligation status: Secondary | ICD-10-CM

## 2020-03-07 LAB — URINALYSIS, ROUTINE W REFLEX MICROSCOPIC
Bacteria, UA: NONE SEEN
Bilirubin Urine: NEGATIVE
Glucose, UA: >=500 mg/dL — AB
Hgb urine dipstick: NEGATIVE
Ketones, ur: NEGATIVE mg/dL
Leukocytes,Ua: NEGATIVE
Nitrite: NEGATIVE
Protein, ur: 30 mg/dL — AB
Specific Gravity, Urine: 1.027 (ref 1.005–1.030)
pH: 5 (ref 5.0–8.0)

## 2020-03-07 LAB — CBC
HCT: 32.9 % — ABNORMAL LOW (ref 36.0–46.0)
Hemoglobin: 10.3 g/dL — ABNORMAL LOW (ref 12.0–15.0)
MCH: 24.5 pg — ABNORMAL LOW (ref 26.0–34.0)
MCHC: 31.3 g/dL (ref 30.0–36.0)
MCV: 78.3 fL — ABNORMAL LOW (ref 80.0–100.0)
Platelets: 237 10*3/uL (ref 150–400)
RBC: 4.2 MIL/uL (ref 3.87–5.11)
RDW: 22.5 % — ABNORMAL HIGH (ref 11.5–15.5)
WBC: 11.5 10*3/uL — ABNORMAL HIGH (ref 4.0–10.5)
nRBC: 0 % (ref 0.0–0.2)

## 2020-03-07 LAB — COMPREHENSIVE METABOLIC PANEL
ALT: 15 U/L (ref 0–44)
AST: 35 U/L (ref 15–41)
Albumin: 4.3 g/dL (ref 3.5–5.0)
Alkaline Phosphatase: 98 U/L (ref 38–126)
Anion gap: 14 (ref 5–15)
BUN: 12 mg/dL (ref 6–20)
CO2: 24 mmol/L (ref 22–32)
Calcium: 9.7 mg/dL (ref 8.9–10.3)
Chloride: 101 mmol/L (ref 98–111)
Creatinine, Ser: 1.13 mg/dL — ABNORMAL HIGH (ref 0.44–1.00)
GFR calc Af Amer: >60 mL/min (ref >60)
GFR calc non Af Amer: 59 mL/min — ABNORMAL LOW (ref >60)
Glucose, Bld: 262 mg/dL — ABNORMAL HIGH (ref 70–99)
Potassium: 3.7 mmol/L (ref 3.5–5.1)
Sodium: 139 mmol/L (ref 135–145)
Total Bilirubin: 0.7 mg/dL (ref 0.3–1.2)
Total Protein: 8.1 g/dL (ref 6.5–8.1)

## 2020-03-07 LAB — LIPASE, BLOOD: Lipase: 24 U/L (ref 11–51)

## 2020-03-07 LAB — I-STAT BETA HCG BLOOD, ED (MC, WL, AP ONLY): I-stat hCG, quantitative: <5 m[IU]/mL (ref <5)

## 2020-03-07 MED ORDER — SODIUM CHLORIDE 0.9 % IV SOLN
2.0000 g | Freq: Once | INTRAVENOUS | Status: AC
  Administered 2020-03-08: 2 g via INTRAVENOUS
  Filled 2020-03-07: qty 20

## 2020-03-07 MED ORDER — SODIUM CHLORIDE 0.9% FLUSH
3.0000 mL | Freq: Once | INTRAVENOUS | Status: AC
  Administered 2020-03-07: 3 mL via INTRAVENOUS

## 2020-03-07 MED ORDER — ONDANSETRON HCL 4 MG/2ML IJ SOLN
4.0000 mg | Freq: Once | INTRAMUSCULAR | Status: AC | PRN
  Administered 2020-03-07: 4 mg via INTRAVENOUS
  Filled 2020-03-07: qty 2

## 2020-03-07 MED ORDER — ONDANSETRON HCL 4 MG/2ML IJ SOLN
4.0000 mg | Freq: Once | INTRAMUSCULAR | Status: AC
  Administered 2020-03-08: 4 mg via INTRAVENOUS
  Filled 2020-03-07: qty 2

## 2020-03-07 MED ORDER — METRONIDAZOLE IN NACL 5-0.79 MG/ML-% IV SOLN
500.0000 mg | Freq: Once | INTRAVENOUS | Status: AC
  Administered 2020-03-08: 500 mg via INTRAVENOUS
  Filled 2020-03-07: qty 100

## 2020-03-07 MED ORDER — SODIUM CHLORIDE 0.9 % IV SOLN
1000.0000 mL | INTRAVENOUS | Status: DC
  Administered 2020-03-08: 1000 mL via INTRAVENOUS

## 2020-03-07 MED ORDER — HYDROMORPHONE HCL 1 MG/ML IJ SOLN
1.0000 mg | Freq: Once | INTRAMUSCULAR | Status: AC
  Administered 2020-03-08: 1 mg via INTRAVENOUS
  Filled 2020-03-07: qty 1

## 2020-03-07 NOTE — ED Notes (Signed)
Patient is complaining of abdominal pain all over. Patient rolling around in bed. Patient is not actively vomiting but feeling nauseas.

## 2020-03-07 NOTE — ED Triage Notes (Signed)
Patient brought from home by [facility]. Patient is complaining of abdominal pain all over, nausea, and vomiting since 2 pm. Patient has not vomited with [facility].

## 2020-03-07 NOTE — ED Notes (Signed)
Requested urine from patient. Placing purewick on patient.

## 2020-03-07 NOTE — ED Notes (Signed)
Pt is on Pure wick.

## 2020-03-07 NOTE — ED Provider Notes (Signed)
[facility] DEPT Provider Note   CSN: [id number] Arrival date & time: 03/07/20  2110     History Chief Complaint  Patient presents with  . Abdominal Pain    Jill Shaw is a 44 y.o. female with a hx of diabetes, CKD, morbid obesity, anemia, COPD, GERD, hypertension, seizures, sickle cell trait presents to the Emergency Department complaining of acute, persistent and severe generalized abdominal pain onset around 2 PM.  Patient reports she is also had nausea and vomiting since that time.  No diarrhea.  Patient reports pain is a 10/10.  No specific aggravating or alleviating factors.  No treatments prior to arrival.  Patient reports she has had pain like this in the past but no one has been able to tell her why.  The history is provided by the patient and medical records. No language interpreter was used.       Past Medical History:  Diagnosis Date  . Anemia   . Arthritis    knees, hands  . Asthma   . COPD (chronic obstructive pulmonary disease) ([place])   . Diabetes mellitus without complication ([place])    type 2  . Dysfunctional uterine bleeding   . GERD (gastroesophageal reflux disease)   . Hypertension   . Neuromuscular disorder (HCC)    neuropathy feet  . Seizures ([place]) 09/12/2017   pt states r/t stress and blood sugar - no meds last one 4 months ago, not seen neurologist  . Sickle cell trait ([place])   . Smoker   . Vitamin D deficiency 10/2019  . Wears glasses     Patient Active Problem List   Diagnosis Date Noted  . Acute encephalopathy 01/29/2020  . AKI (acute kidney injury) ([place]) 01/29/2020  . Symptomatic anemia 12/19/2019  . Elevated sed rate 11/26/2019  . Elevated C-reactive protein (CRP) 11/26/2019  . Hypoglycemia 02/07/2019  . Hypotension 02/07/2019  . Lactic acidosis 02/07/2019  . Right ankle pain 02/07/2019  . Chronic obstructive pulmonary disease ([place]) 02/05/2019  . Seizure ([place]) 07/31/2018  . Vitamin D deficiency  05/31/2017  . Gastroesophageal reflux disease 05/24/2017  . Abnormal uterine bleeding (AUB) 05/17/2017  . Anemia of chronic disease 04/06/2017  . Knee pain, chronic 03/27/2016  . Type 2 diabetes mellitus without complication, with long-term current use of insulin ([place]) 03/27/2016  . Essential hypertension 03/27/2016  . Morbid obesity ([place]) 03/27/2016  . Irritable bowel syndrome with constipation 03/27/2016  . Tobacco dependence 03/27/2016  . CKD (chronic kidney disease) stage 2, GFR 60-89 ml/min 03/25/2016  . Arthropathy, lower leg 05/04/2013  . CTS (carpal tunnel syndrome) 05/04/2013  . Peripheral edema 05/04/2013  . Chronic pain 05/13/2012  . Diabetic gastroparesis ([place]) 11/06/2006  . Hot flashes 11/06/2006    Past Surgical History:  Procedure Laterality Date  . CESAREAN SECTION     x 1  . DILATION AND CURETTAGE OF UTERUS N/A 08/20/2019   Procedure: DILATATION AND CURETTAGE;  Surgeon: Emily Filbert, MD;  Location: [facility];  Service: Gynecology;  Laterality: N/A;  . ENDOMETRIAL ABLATION N/A 08/20/2019   Procedure: [place] Ablation;  Surgeon: Emily Filbert, MD;  Location: [facility];  Service: Gynecology;  Laterality: N/A;  . EYE SURGERY Bilateral    laser right and cataract removed left eye  . RADIOLOGY WITH ANESTHESIA N/A 09/16/2019   Procedure: MRI WITH ANESTHESIA   L SPINE WITHOUT CONTRAST, T SPINE WITHOUT CONTRAST , CERVICAL WITHOUT CONTRAST;  Surgeon: Radiologist, Medication, MD;  Location: [facility];  Service:  Radiology;  Laterality: N/A;  . TUBAL LIGATION    . UPPER GI ENDOSCOPY  07/2017     OB History    Gravida  5   Para      Term      Preterm      AB      Living  3     SAB      TAB      Ectopic      Multiple      Live Births              Family History  Problem Relation Age of Onset  . Diabetes Mother   . Hypertension Mother     Social History   Tobacco Use  . Smoking status: Current Every Day Smoker    Packs/day: 0.50    Years: 26.00    Pack  years: 13.00    Types: Cigarettes  . Smokeless tobacco: Never Used  Substance Use Topics  . Alcohol use: No  . Drug use: No    Home Medications Prior to Admission medications   Medication Sig Start Date End Date Taking? Authorizing Provider  albuterol (PROVENTIL) (2.5 MG/3ML) 0.083% nebulizer solution Take 3 mLs (2.5 mg total) by nebulization every 6 (six) hours as needed for wheezing or shortness of breath. 12/02/19   Vevelyn Francois, NP  albuterol (VENTOLIN HFA) 108 (90 Base) MCG/ACT inhaler Inhale 2 puffs into the lungs every 6 (six) hours as needed for wheezing or shortness of breath. 12/02/19   Vevelyn Francois, NP  blood glucose meter kit and supplies KIT Dispense based on patient and insurance preference. Use up to four times daily as directed. (FOR ICD-9 250.00, 250.01). Patient taking differently: 1 each by Other route See admin instructions. Dispense based on patient and insurance preference. Use up to four times daily as directed. (FOR ICD-9 250.00, 250.01). 02/12/20   Vevelyn Francois, NP  Blood Glucose Monitoring Suppl (TRUE METRIX METER) w/Device KIT 1 each by Does not apply route 4 (four) times daily -  before meals and at bedtime. 01/21/18   Dorena Dew, FNP  Blood Pressure Monitoring (BLOOD PRESSURE KIT) DEVI Check BP BID prn Patient taking differently: 1 each by Other route 2 (two) times daily as needed (blood pressure).  02/12/20   Vevelyn Francois, NP  budesonide-formoterol (SYMBICORT) 160-4.5 MCG/ACT inhaler INHALE 2 PUFFS INTO THE LUNGS 2 (TWO) TIMES DAILY. Patient taking differently: Inhale 2 puffs into the lungs 2 (two) times daily.  12/02/19   Vevelyn Francois, NP  cetirizine (ZYRTEC) 10 MG tablet Take 1 tablet (10 mg total) by mouth daily. 12/02/19   Vevelyn Francois, NP  Cholecalciferol (VITAMIN D-3) 125 MCG (5000 UT) TABS Take 1 tablet by mouth daily. Patient taking differently: Take 5,000 Units by mouth daily.  08/22/19   Hilts, Legrand Como, MD  DULoxetine (CYMBALTA) 20 MG  capsule Take 1 capsule (20 mg total) by mouth daily. 02/12/20 04/12/20  Vevelyn Francois, NP  ferrous sulfate 325 (65 FE) MG tablet Take 1 tablet (325 mg total) by mouth 3 (three) times daily with meals. 12/02/19   Vevelyn Francois, NP  fluticasone (FLONASE) 50 MCG/ACT nasal spray Place 2 sprays into both nostrils daily. 11/24/19   Vevelyn Francois, NP  furosemide (LASIX) 40 MG tablet Take 1 tablet (40 mg total) by mouth daily. 02/04/20   Thurnell Lose, MD  Glucosamine Sulfate 1000 MG CAPS Take 1 capsule (1,000 mg total) by  mouth 2 (two) times daily. 08/22/19   Hilts, Legrand Como, MD  glucose blood (TRUE METRIX BLOOD GLUCOSE TEST) test strip Use as instructed Patient taking differently: 1 each by Other route See admin instructions. Use as instructed 01/21/18   Dorena Dew, FNP  hydrocortisone 2.5 % cream APPLY TOPICALLY 2 (TWO) TIMES DAILY. 03/04/20   Vevelyn Francois, NP  Insulin Lispro Prot & Lispro (HUMALOG MIX 75/25 KWIKPEN) (75-25) 100 UNIT/ML Kwikpen INJECT 100 UNITS EVERY 12 HOURS Patient taking differently: Inject 100 Units into the skin every 12 (twelve) hours.  12/02/19   Vevelyn Francois, NP  Insulin Pen Needle (PEN NEEDLES) 30G X 5 MM MISC 1 Units by Does not apply route as directed. 12/02/19   Vevelyn Francois, NP  Lido-Capsaicin-Men-Methyl Sal 0.5-0.035-5-20 % PTCH Apply 1 patch topically daily. 12/18/19 03/17/20  Vevelyn Francois, NP  losartan (COZAAR) 50 MG tablet Take 1 tablet (50 mg total) by mouth daily. 02/06/20   Thurnell Lose, MD  metoprolol succinate (TOPROL-XL) 100 MG 24 hr tablet Take 1 tablet (100 mg total) by mouth daily. Take with or immediately following a meal. 12/26/19   Vevelyn Francois, NP  norethindrone (AYGESTIN) 5 MG tablet Take 2 tablets (10 mg total) by mouth every 8 (eight) hours. Patient not taking: Reported on 03/02/2020 12/31/19 03/02/20  Aletha Halim, MD  omeprazole (PRILOSEC) 40 MG capsule Take 1 capsule (40 mg total) by mouth daily. 12/02/19   Vevelyn Francois, NP   potassium chloride SA (KLOR-CON) 20 MEQ tablet Take 1 tablet (20 mEq total) by mouth daily. 02/04/20   Thurnell Lose, MD  saxagliptin HCl (ONGLYZA) 2.5 MG TABS tablet Take 1 tablet (2.5 mg total) by mouth daily. 12/02/19   Vevelyn Francois, NP  tizanidine (ZANAFLEX) 6 MG capsule TAKE 1 CAPSULE BY MOUTH 3 TIMES DAILY. Patient taking differently: Take 6 mg by mouth 3 (three) times daily.  02/18/20   Vevelyn Francois, NP  TRUEplus Lancets 28G MISC 1 each by Other route in the morning, at noon, in the evening, and at bedtime. 02/12/20   [provider]  Turmeric 500 MG CAPS Take 500 mg by mouth 2 (two) times daily. 08/22/19   Hilts, Legrand Como, MD  Vitamin D, Ergocalciferol, (DRISDOL) 1.25 MG (50000 UNIT) CAPS capsule Take 1 capsule (50,000 Units total) by mouth every 7 (seven) days. Patient taking differently: Take 50,000 Units by mouth every Friday.  12/02/19   Vevelyn Francois, NP  hydrALAZINE (APRESOLINE) 50 MG tablet Take 1 tablet (50 mg total) by mouth 3 (three) times daily. 02/03/20 02/20/20  Thurnell Lose, MD    Allergies    Ketoprofen, Aspirin, Gabapentin, Ibuprofen, Liraglutide, Naproxen, Omeprazole-sodium bicarbonate, Sulfa antibiotics, and Tramadol  Review of Systems   Review of Systems  Constitutional: Positive for diaphoresis. Negative for appetite change, fatigue, fever and unexpected weight change.  HENT: Negative for mouth sores.   Eyes: Negative for visual disturbance.  Respiratory: Negative for cough, chest tightness, shortness of breath and wheezing.   Cardiovascular: Negative for chest pain.  Gastrointestinal: Positive for abdominal pain, nausea and vomiting. Negative for constipation and diarrhea.  Endocrine: Negative for polydipsia, polyphagia and polyuria.  Genitourinary: Negative for dysuria, frequency, hematuria and urgency.  Musculoskeletal: Negative for back pain and neck stiffness.  Skin: Negative for rash.  Allergic/Immunologic: Negative for immunocompromised  state.  Neurological: Negative for syncope, light-headedness and headaches.  Hematological: Does not bruise/bleed easily.  Psychiatric/Behavioral: Negative for sleep disturbance. The patient  is not nervous/anxious.     Physical Exam Updated Vital Signs BP (!) 156/91 (BP Location: Left Arm)   Pulse (!) 124   Temp 99 F (37.2 C) (Oral)   Resp (!) 25   Ht '5\' 4"'$  (1.626 m)   Wt 122.5 kg   SpO2 100%   BMI 46.35 kg/m   Physical Exam Vitals and nursing note reviewed.  Constitutional:      General: She is in acute distress.     Appearance: She is diaphoretic.  HENT:     Head: Normocephalic.     Mouth/Throat:     Mouth: Mucous membranes are moist.  Eyes:     General: No scleral icterus.    Conjunctiva/sclera: Conjunctivae normal.  Cardiovascular:     Rate and Rhythm: Regular rhythm. Tachycardia present.     Pulses: Normal pulses.          Radial pulses are 2+ on the right side and 2+ on the left side.  Pulmonary:     Effort: Pulmonary effort is normal. No tachypnea, accessory muscle usage, prolonged expiration, respiratory distress or retractions.     Breath sounds: No stridor.     Comments: Equal chest rise. No increased work of breathing. Abdominal:     General: Bowel sounds are decreased. There is distension.     Palpations: Abdomen is soft.     Tenderness: There is generalized abdominal tenderness. There is guarding and rebound.       Comments: Exam limited by body habitus. Neurolysed tenderness with rebound and guarding.   Musculoskeletal:     Cervical back: Normal range of motion.     Comments: Moves all extremities equally and without difficulty.  Skin:    General: Skin is warm.     Capillary Refill: Capillary refill takes less than 2 seconds.  Neurological:     Mental Status: She is alert.     GCS: GCS eye subscore is 4. GCS verbal subscore is 5. GCS motor subscore is 6.     Comments: Speech is clear and goal oriented.  Psychiatric:        Mood and Affect:  Mood normal.     ED Results / Procedures / Treatments   Labs (all labs ordered are listed, but only abnormal results are displayed) Labs Reviewed  COMPREHENSIVE METABOLIC PANEL - Abnormal; Notable for the following components:      Result Value   Glucose, Bld 262 (*)    Creatinine, Ser 1.13 (*)    GFR calc non Af Amer 59 (*)    All other components within normal limits  CBC - Abnormal; Notable for the following components:   WBC 11.5 (*)    Hemoglobin 10.3 (*)    HCT 32.9 (*)    MCV 78.3 (*)    MCH 24.5 (*)    RDW 22.5 (*)    All other components within normal limits  URINALYSIS, ROUTINE W REFLEX MICROSCOPIC - Abnormal; Notable for the following components:   Glucose, UA >=500 (*)    Protein, ur 30 (*)    All other components within normal limits  LACTIC ACID, PLASMA - Abnormal; Notable for the following components:   Lactic Acid, Venous 3.6 (*)    All other components within normal limits  CULTURE, BLOOD (ROUTINE X 2)  CULTURE, BLOOD (ROUTINE X 2)  URINE CULTURE  SARS CORONAVIRUS 2 BY RT PCR (HOSPITAL ORDER, [facility])  LIPASE, BLOOD  RAPID URINE DRUG SCREEN, HOSP  PERFORMED  LACTIC ACID, PLASMA  APTT  PROTIME-INR  I-STAT BETA HCG BLOOD, ED ([facility], [facility], [facility] ONLY)  TROPONIN I (HIGH SENSITIVITY)  TROPONIN I (HIGH SENSITIVITY)    EKG ED ECG REPORT   Date: 03/08/2020  Rate: 106  Rhythm: sinus tachycardia  QRS Axis: normal  Intervals: normal  ST/T Wave abnormalities: nonspecific T wave changes  Conduction Disutrbances:none  Narrative Interpretation: new T wave inversions in inferior leads  Old EKG Reviewed: changes noted  I have personally reviewed the EKG tracing and agree with the computerized printout as noted.   Radiology DG Chest Port 1 View  Result Date: 03/08/2020 CLINICAL DATA:  Abdominal pain, nausea and vomiting EXAM: PORTABLE CHEST 1 VIEW COMPARISON:  Radiograph 02/20/2020 FINDINGS: Low volumes with increased gradient  attenuation towards the bases likely reflecting a combination of atelectatic change and body habitus accentuated by portable technique. More hazy interstitial opacities with central vascular congestion is similar to prior as well as fissural thickening. Stable cardiomegaly. No pneumothorax or visible effusion. No acute osseous or soft tissue abnormality. Telemetry leads overlie the chest. IMPRESSION: 1. Low lung volumes with basilar atelectasis. 2. Cardiomegaly and central vascular congestion could suggest CHF with mild interstitial edema. Electronically Signed   By: Lovena Le M.D.   On: 03/08/2020 00:31   CT Angio Chest/Abd/Pel for Dissection W and/or Wo Contrast  Result Date: 03/08/2020 CLINICAL DATA:  Abdominal pain with nausea and vomiting. EXAM: CT ANGIOGRAPHY CHEST, ABDOMEN AND PELVIS TECHNIQUE: Non-contrast CT of the chest was initially obtained. Multidetector CT imaging through the chest, abdomen and pelvis was performed using the standard protocol during bolus administration of intravenous contrast. Multiplanar reconstructed images and MIPs were obtained and reviewed to evaluate the vascular anatomy. CONTRAST:  137m OMNIPAQUE IOHEXOL 350 MG/ML SOLN COMPARISON:  01/31/2020 FINDINGS: CTA CHEST FINDINGS Cardiovascular: There is no evidence for thoracic aortic dissection or aneurysm. There is no large centrally located pulmonary embolism. The heart size is stable. There is no significant pericardial effusion Mediastinum/Nodes: --No mediastinal or hilar lymphadenopathy. --No axillary lymphadenopathy. --No supraclavicular lymphadenopathy. --Normal thyroid gland. --The esophagus is unremarkable Lungs/Pleura: No pulmonary nodules or masses. No pleural effusion or pneumothorax. No focal airspace consolidation. No focal pleural abnormality. Musculoskeletal: No chest wall abnormality. No acute or significant osseous findings. Review of the MIP images confirms the above findings. CTA ABDOMEN AND PELVIS FINDINGS  VASCULAR Aorta: Normal caliber aorta without aneurysm, dissection, vasculitis or significant stenosis. Celiac: Patent without evidence of aneurysm, dissection, vasculitis or significant stenosis. SMA: Patent without evidence of aneurysm, dissection, vasculitis or significant stenosis. Renals: Both renal arteries are patent without evidence of aneurysm, dissection, vasculitis, fibromuscular dysplasia or significant stenosis. IMA: Patent without evidence of aneurysm, dissection, vasculitis or significant stenosis. Inflow: Patent without evidence of aneurysm, dissection, vasculitis or significant stenosis. Veins: No obvious venous abnormality within the limitations of this arterial phase study. Review of the MIP images confirms the above findings. NON-VASCULAR Hepatobiliary: Again noted is heterogeneous hepatic steatosis. Normal gallbladder.There is no biliary ductal dilation. Pancreas: Normal contours without ductal dilatation. No peripancreatic fluid collection. Spleen: Unremarkable. Adrenals/Urinary Tract: --Adrenal glands: Unremarkable. --Right kidney/ureter: No hydronephrosis or radiopaque kidney stones. --Left kidney/ureter: No hydronephrosis or radiopaque kidney stones. --Urinary bladder: Unremarkable. Stomach/Bowel: --Stomach/Duodenum: No hiatal hernia or other gastric abnormality. Normal duodenal course and caliber. --Small bowel: Unremarkable. --Colon: Unremarkable. --Appendix: Normal. Vascular/Lymphatic: Normal course and caliber of the major abdominal vessels. --No retroperitoneal lymphadenopathy. --No mesenteric lymphadenopathy. --No pelvic or inguinal lymphadenopathy. Reproductive: Unremarkable Other: No ascites or  free air. The abdominal wall is normal. Musculoskeletal. No acute displaced fractures. Review of the MIP images confirms the above findings. IMPRESSION: 1. No acute thoracic, abdominal or pelvic pathology. Specifically, no evidence for aortic dissection or aneurysm. 2. Heterogeneous hepatic  steatosis. Electronically Signed   By: Constance Holster M.D.   On: 03/08/2020 02:14    Procedures Procedures (including critical care time)  Medications Ordered in ED Medications  0.9 %  sodium chloride infusion (1,000 mLs Intravenous New Bag/Given 03/08/20 0005)  sucralfate (CARAFATE) 1 GM/10ML suspension 0.5 g (has no administration in time range)  sodium chloride flush (NS) 0.9 % injection 3 mL (3 mLs Intravenous Given 03/07/20 2148)  ondansetron (ZOFRAN) injection 4 mg (4 mg Intravenous Given 03/07/20 2149)  HYDROmorphone (DILAUDID) injection 1 mg (1 mg Intravenous Given 03/08/20 0005)  ondansetron (ZOFRAN) injection 4 mg (4 mg Intravenous Given 03/08/20 0005)  cefTRIAXone (ROCEPHIN) 2 g in sodium chloride 0.9 % 100 mL IVPB (0 g Intravenous Stopped 03/08/20 0227)  metroNIDAZOLE (FLAGYL) IVPB 500 mg (0 mg Intravenous Stopped 03/08/20 0227)  HYDROmorphone (DILAUDID) injection 1 mg (1 mg Intravenous Given 03/08/20 0158)  sodium chloride 0.9 % bolus 1,000 mL (0 mLs Intravenous Stopped 03/08/20 0229)  iohexol (OMNIPAQUE) 350 MG/ML injection 100 mL (100 mLs Intravenous Contrast Given 03/08/20 0137)  HYDROmorphone (DILAUDID) injection 1 mg (1 mg Intravenous Given 03/08/20 0653)  promethazine (PHENERGAN) injection 25 mg (25 mg Intravenous Given 03/08/20 0653)  pantoprazole (PROTONIX) injection 40 mg (40 mg Intravenous Given 03/08/20 0657)  dicyclomine (BENTYL) injection 20 mg (20 mg Intramuscular Given 03/08/20 6945)    ED Course  I have reviewed the triage vital signs and the nursing notes.  Pertinent labs & imaging results that were available during my care of the patient were reviewed by me and considered in my medical decision making (see chart for details).  Clinical Course as of Mar 08 725  Sun Mar 07, 2020  2357 Tachycardiac, tachypneic and hypertension.  No hypotension which patient has had previously.  Pulse Rate(!): 124 [HM]  Mon Mar 08, 2020  0108 Patient continues to have significant  pain.  Tachycardia and hypertension persist.  She is requesting additional pain medication.  Will redose Dilaudid.  Imaging pending.   [HM]  [phone] Patient continues to have significant pain.  Third milligram of Dilaudid ordered along with Bentyl and Carafate and Protonix.  Unknown etiology for patient's pain however she has not passed a p.o. trial.  She remains tachycardic and hypertensive.  Will admit.   [HM]    Clinical Course User Index [HM] Muthersbaugh, Gwenlyn Perking   MDM Rules/Calculators/A&P                       Patient presents with severe abdominal pain, nausea and vomiting onset around 2 PM.  Abdomen is rigid.  Bedside ultrasound placed however unable to visualize aorta due to body habitus and bowel gas.  Patient tachycardic, tachypneic with leukocytosis.  Sepsis work-up initiated.  CT dissection study also initiated.  Patient without chest pain however given severe abdominal pain and difficult exam.    6:58 AM Patient with extensive work-up here in the emergency department.  Labs are largely reassuring.  Mild leukocytosis noted.  Slightly elevated serum creatinine.  Patient given fluids.  No increase specific gravity.  Lactic acid within normal limits.  Troponin negative.  EKG with some new T wave inversion however patient adamantly denies chest pain and has normal troponin.  Given severe  pain CT angio obtained to rule out dissection.  No evidence of dissection or mesenteric ischemia.  No other evidence of intra-abdominal abnormality.  Despite numerous doses of Dilaudid and antiemetic patient continues to have severe pain and nausea.  Patient failed p.o. trial with additional vomiting.  She remains tachycardic and hypertensive.  Will need admission for intractable pain and vomiting.  The patient was discussed with and seen by Dr. Christy Gentles who agrees with the treatment plan.  7:26 AM Discussed patient's case with hospitalist, Dr. Francine Graven.  I have recommended admission and patient (and  family if present) agree with this plan. Admitting physician will place admission orders.     Final Clinical Impression(s) / ED Diagnoses Final diagnoses:  Intractable pain  Generalized abdominal pain  Tachycardia    Rx / DC Orders ED Discharge Orders    None       Muthersbaugh, Gwenlyn Perking 03/08/20 3754    Ripley Fraise, MD 03/08/20 [phone]

## 2020-03-08 ENCOUNTER — Emergency Department (HOSPITAL_COMMUNITY): Payer: Medicaid Other

## 2020-03-08 ENCOUNTER — Encounter (HOSPITAL_COMMUNITY): Payer: Self-pay

## 2020-03-08 DIAGNOSIS — G40909 Epilepsy, unspecified, not intractable, without status epilepticus: Secondary | ICD-10-CM | POA: Diagnosis present

## 2020-03-08 DIAGNOSIS — R109 Unspecified abdominal pain: Secondary | ICD-10-CM | POA: Diagnosis present

## 2020-03-08 DIAGNOSIS — E1122 Type 2 diabetes mellitus with diabetic chronic kidney disease: Secondary | ICD-10-CM | POA: Diagnosis present

## 2020-03-08 DIAGNOSIS — N939 Abnormal uterine and vaginal bleeding, unspecified: Secondary | ICD-10-CM | POA: Diagnosis present

## 2020-03-08 DIAGNOSIS — I5032 Chronic diastolic (congestive) heart failure: Secondary | ICD-10-CM | POA: Diagnosis present

## 2020-03-08 DIAGNOSIS — Z886 Allergy status to analgesic agent status: Secondary | ICD-10-CM | POA: Diagnosis not present

## 2020-03-08 DIAGNOSIS — D509 Iron deficiency anemia, unspecified: Secondary | ICD-10-CM | POA: Diagnosis present

## 2020-03-08 DIAGNOSIS — J449 Chronic obstructive pulmonary disease, unspecified: Secondary | ICD-10-CM | POA: Diagnosis present

## 2020-03-08 DIAGNOSIS — R000 Tachycardia, unspecified: Secondary | ICD-10-CM | POA: Diagnosis present

## 2020-03-08 DIAGNOSIS — R1033 Periumbilical pain: Secondary | ICD-10-CM | POA: Diagnosis present

## 2020-03-08 DIAGNOSIS — N189 Chronic kidney disease, unspecified: Secondary | ICD-10-CM | POA: Diagnosis present

## 2020-03-08 DIAGNOSIS — D638 Anemia in other chronic diseases classified elsewhere: Secondary | ICD-10-CM | POA: Diagnosis present

## 2020-03-08 DIAGNOSIS — K2970 Gastritis, unspecified, without bleeding: Secondary | ICD-10-CM | POA: Diagnosis not present

## 2020-03-08 DIAGNOSIS — R1084 Generalized abdominal pain: Secondary | ICD-10-CM | POA: Diagnosis present

## 2020-03-08 DIAGNOSIS — I130 Hypertensive heart and chronic kidney disease with heart failure and stage 1 through stage 4 chronic kidney disease, or unspecified chronic kidney disease: Secondary | ICD-10-CM | POA: Diagnosis present

## 2020-03-08 DIAGNOSIS — Z888 Allergy status to other drugs, medicaments and biological substances status: Secondary | ICD-10-CM | POA: Diagnosis not present

## 2020-03-08 DIAGNOSIS — Z20822 Contact with and (suspected) exposure to covid-19: Secondary | ICD-10-CM | POA: Diagnosis present

## 2020-03-08 DIAGNOSIS — K219 Gastro-esophageal reflux disease without esophagitis: Secondary | ICD-10-CM | POA: Diagnosis present

## 2020-03-08 DIAGNOSIS — Z6841 Body Mass Index (BMI) 40.0 and over, adult: Secondary | ICD-10-CM | POA: Diagnosis not present

## 2020-03-08 DIAGNOSIS — F329 Major depressive disorder, single episode, unspecified: Secondary | ICD-10-CM | POA: Diagnosis present

## 2020-03-08 DIAGNOSIS — D573 Sickle-cell trait: Secondary | ICD-10-CM | POA: Diagnosis present

## 2020-03-08 DIAGNOSIS — Z794 Long term (current) use of insulin: Secondary | ICD-10-CM | POA: Diagnosis not present

## 2020-03-08 DIAGNOSIS — N938 Other specified abnormal uterine and vaginal bleeding: Secondary | ICD-10-CM | POA: Diagnosis present

## 2020-03-08 DIAGNOSIS — G8929 Other chronic pain: Secondary | ICD-10-CM | POA: Diagnosis present

## 2020-03-08 DIAGNOSIS — Z882 Allergy status to sulfonamides status: Secondary | ICD-10-CM | POA: Diagnosis not present

## 2020-03-08 LAB — URINALYSIS, COMPLETE (UACMP) WITH MICROSCOPIC
Bacteria, UA: NONE SEEN
Bilirubin Urine: NEGATIVE
Glucose, UA: >=500 mg/dL — AB
Hgb urine dipstick: NEGATIVE
Ketones, ur: 20 mg/dL — AB
Leukocytes,Ua: NEGATIVE
Nitrite: NEGATIVE
Protein, ur: NEGATIVE mg/dL
Specific Gravity, Urine: 1.03 (ref 1.005–1.030)
pH: 6 (ref 5.0–8.0)

## 2020-03-08 LAB — RAPID URINE DRUG SCREEN, HOSP PERFORMED
Amphetamines: NOT DETECTED
Barbiturates: NOT DETECTED
Benzodiazepines: NOT DETECTED
Cocaine: NOT DETECTED
Opiates: NOT DETECTED
Tetrahydrocannabinol: NOT DETECTED

## 2020-03-08 LAB — CBC WITH DIFFERENTIAL/PLATELET
Abs Immature Granulocytes: 0.03 10*3/uL (ref 0.00–0.07)
Basophils Absolute: 0.1 10*3/uL (ref 0.0–0.1)
Basophils Relative: 1 %
Eosinophils Absolute: 0.1 10*3/uL (ref 0.0–0.5)
Eosinophils Relative: 1 %
HCT: 29.8 % — ABNORMAL LOW (ref 36.0–46.0)
Hemoglobin: 9.1 g/dL — ABNORMAL LOW (ref 12.0–15.0)
Immature Granulocytes: 0 %
Lymphocytes Relative: 21 %
Lymphs Abs: 2.1 10*3/uL (ref 0.7–4.0)
MCH: 24.5 pg — ABNORMAL LOW (ref 26.0–34.0)
MCHC: 30.5 g/dL (ref 30.0–36.0)
MCV: 80.3 fL (ref 80.0–100.0)
Monocytes Absolute: 0.7 10*3/uL (ref 0.1–1.0)
Monocytes Relative: 6 %
Neutro Abs: 7.4 10*3/uL (ref 1.7–7.7)
Neutrophils Relative %: 71 %
Platelets: 165 10*3/uL (ref 150–400)
RBC: 3.71 MIL/uL — ABNORMAL LOW (ref 3.87–5.11)
RDW: 22.4 % — ABNORMAL HIGH (ref 11.5–15.5)
WBC: 10.4 10*3/uL (ref 4.0–10.5)
nRBC: 0 % (ref 0.0–0.2)

## 2020-03-08 LAB — SARS CORONAVIRUS 2 BY RT PCR (HOSPITAL ORDER, PERFORMED IN ~~LOC~~ HOSPITAL LAB): SARS Coronavirus 2: NEGATIVE

## 2020-03-08 LAB — GLUCOSE, CAPILLARY
Glucose-Capillary: 336 mg/dL — ABNORMAL HIGH (ref 70–99)
Glucose-Capillary: 367 mg/dL — ABNORMAL HIGH (ref 70–99)
Glucose-Capillary: 365 mg/dL — ABNORMAL HIGH (ref 70–99)

## 2020-03-08 LAB — LACTIC ACID, PLASMA
Lactic Acid, Venous: 3.6 mmol/L (ref 0.5–1.9)
Lactic Acid, Venous: 1.6 mmol/L (ref 0.5–1.9)

## 2020-03-08 LAB — TROPONIN I (HIGH SENSITIVITY)
Troponin I (High Sensitivity): 5 ng/L (ref <18)
Troponin I (High Sensitivity): 4 ng/L (ref <18)

## 2020-03-08 LAB — PROTIME-INR
INR: 1.1 (ref 0.8–1.2)
Prothrombin Time: 13.3 seconds (ref 11.4–15.2)

## 2020-03-08 LAB — APTT: aPTT: 31 seconds (ref 24–36)

## 2020-03-08 MED ORDER — ONDANSETRON HCL 4 MG PO TABS: 4.0000 mg | ORAL_TABLET | Freq: Four times a day (QID) | ORAL | Status: DC | PRN

## 2020-03-08 MED ORDER — FUROSEMIDE 40 MG PO TABS: 40.0000 mg | ORAL_TABLET | Freq: Every day | ORAL | Status: DC

## 2020-03-08 MED ORDER — HYDRALAZINE HCL 20 MG/ML IJ SOLN
10.0000 mg | Freq: Four times a day (QID) | INTRAMUSCULAR | Status: DC | PRN
  Administered 2020-03-08 – 2020-03-09 (×3): 10 mg via INTRAVENOUS
  Filled 2020-03-08 (×5): qty 1

## 2020-03-08 MED ORDER — DICYCLOMINE HCL 10 MG/ML IM SOLN
20.0000 mg | Freq: Once | INTRAMUSCULAR | Status: AC
  Administered 2020-03-08: 20 mg via INTRAMUSCULAR
  Filled 2020-03-08: qty 2

## 2020-03-08 MED ORDER — IOHEXOL 350 MG/ML SOLN
100.0000 mL | Freq: Once | INTRAVENOUS | Status: AC | PRN
  Administered 2020-03-08: 100 mL via INTRAVENOUS

## 2020-03-08 MED ORDER — SODIUM CHLORIDE (PF) 0.9 % IJ SOLN
INTRAMUSCULAR | Status: AC
  Filled 2020-03-08: qty 50

## 2020-03-08 MED ORDER — HYDROMORPHONE HCL 1 MG/ML IJ SOLN
1.0000 mg | Freq: Once | INTRAMUSCULAR | Status: AC
  Administered 2020-03-08: 1 mg via INTRAVENOUS
  Filled 2020-03-08: qty 1

## 2020-03-08 MED ORDER — MORPHINE SULFATE (PF) 2 MG/ML IV SOLN
2.0000 mg | Freq: Once | INTRAVENOUS | Status: AC
  Administered 2020-03-08: 2 mg via INTRAVENOUS
  Filled 2020-03-08: qty 1

## 2020-03-08 MED ORDER — VITAMIN D (ERGOCALCIFEROL) 1.25 MG (50000 UNIT) PO CAPS: 50000.0000 [IU] | ORAL_CAPSULE | ORAL | Status: DC

## 2020-03-08 MED ORDER — INSULIN ASPART 100 UNIT/ML ~~LOC~~ SOLN
8.0000 [IU] | Freq: Once | SUBCUTANEOUS | Status: AC
  Administered 2020-03-08: 8 [IU] via SUBCUTANEOUS

## 2020-03-08 MED ORDER — DULOXETINE HCL 20 MG PO CPEP
20.0000 mg | ORAL_CAPSULE | Freq: Every day | ORAL | Status: DC
  Administered 2020-03-10: 20 mg via ORAL
  Filled 2020-03-08 (×3): qty 1

## 2020-03-08 MED ORDER — METOPROLOL SUCCINATE ER 100 MG PO TB24
100.0000 mg | ORAL_TABLET | Freq: Every day | ORAL | Status: DC
  Administered 2020-03-09 – 2020-03-10 (×2): 100 mg via ORAL
  Filled 2020-03-08 (×4): qty 1

## 2020-03-08 MED ORDER — PANTOPRAZOLE SODIUM 40 MG PO TBEC
40.0000 mg | DELAYED_RELEASE_TABLET | Freq: Every day | ORAL | Status: DC
  Administered 2020-03-09: 40 mg via ORAL
  Filled 2020-03-08: qty 1

## 2020-03-08 MED ORDER — SUCRALFATE 1 GM/10ML PO SUSP
0.5000 g | Freq: Three times a day (TID) | ORAL | Status: DC
  Administered 2020-03-08 – 2020-03-10 (×4): 0.5 g via ORAL
  Filled 2020-03-08 (×4): qty 10

## 2020-03-08 MED ORDER — FERROUS SULFATE 325 (65 FE) MG PO TABS
325.0000 mg | ORAL_TABLET | Freq: Three times a day (TID) | ORAL | Status: DC
  Administered 2020-03-10: 325 mg via ORAL
  Filled 2020-03-08 (×2): qty 1

## 2020-03-08 MED ORDER — NICOTINE 14 MG/24HR TD PT24
14.0000 mg | MEDICATED_PATCH | Freq: Every day | TRANSDERMAL | Status: DC
  Filled 2020-03-08 (×3): qty 1

## 2020-03-08 MED ORDER — FENTANYL CITRATE (PF) 100 MCG/2ML IJ SOLN
50.0000 ug | Freq: Once | INTRAMUSCULAR | Status: AC
  Administered 2020-03-08: 50 ug via INTRAVENOUS
  Filled 2020-03-08: qty 2

## 2020-03-08 MED ORDER — LOSARTAN POTASSIUM 50 MG PO TABS
50.0000 mg | ORAL_TABLET | Freq: Every day | ORAL | Status: DC
  Administered 2020-03-09: 50 mg via ORAL
  Filled 2020-03-08 (×3): qty 1

## 2020-03-08 MED ORDER — INSULIN ASPART 100 UNIT/ML ~~LOC~~ SOLN
0.0000 [IU] | SUBCUTANEOUS | Status: DC
  Filled 2020-03-08: qty 0.15

## 2020-03-08 MED ORDER — MOMETASONE FURO-FORMOTEROL FUM 200-5 MCG/ACT IN AERO
2.0000 | INHALATION_SPRAY | Freq: Two times a day (BID) | RESPIRATORY_TRACT | Status: DC
  Administered 2020-03-08 – 2020-03-10 (×4): 2 via RESPIRATORY_TRACT
  Filled 2020-03-08: qty 8.8

## 2020-03-08 MED ORDER — ALBUTEROL SULFATE (2.5 MG/3ML) 0.083% IN NEBU: 2.5000 mg | INHALATION_SOLUTION | Freq: Four times a day (QID) | RESPIRATORY_TRACT | Status: DC | PRN

## 2020-03-08 MED ORDER — NORETHINDRONE ACETATE 5 MG PO TABS
10.0000 mg | ORAL_TABLET | Freq: Three times a day (TID) | ORAL | Status: DC
  Administered 2020-03-09 – 2020-03-10 (×2): 10 mg via ORAL
  Filled 2020-03-08 (×8): qty 2

## 2020-03-08 MED ORDER — PANTOPRAZOLE SODIUM 40 MG IV SOLR
40.0000 mg | Freq: Once | INTRAVENOUS | Status: AC
  Administered 2020-03-08: 40 mg via INTRAVENOUS
  Filled 2020-03-08: qty 40

## 2020-03-08 MED ORDER — ENOXAPARIN SODIUM 60 MG/0.6ML ~~LOC~~ SOLN
60.0000 mg | SUBCUTANEOUS | Status: DC
  Administered 2020-03-08 – 2020-03-09 (×2): 60 mg via SUBCUTANEOUS
  Filled 2020-03-08 (×3): qty 0.6

## 2020-03-08 MED ORDER — FUROSEMIDE 10 MG/ML IJ SOLN
40.0000 mg | Freq: Every day | INTRAMUSCULAR | Status: DC
  Administered 2020-03-08 – 2020-03-09 (×2): 40 mg via INTRAVENOUS
  Filled 2020-03-08 (×2): qty 4

## 2020-03-08 MED ORDER — PROMETHAZINE HCL 25 MG/ML IJ SOLN
12.5000 mg | Freq: Once | INTRAMUSCULAR | Status: AC
  Administered 2020-03-08: 12.5 mg via INTRAVENOUS
  Filled 2020-03-08: qty 1

## 2020-03-08 MED ORDER — DICYCLOMINE HCL 10 MG PO CAPS
10.0000 mg | ORAL_CAPSULE | Freq: Three times a day (TID) | ORAL | Status: DC
  Administered 2020-03-08 – 2020-03-10 (×3): 10 mg via ORAL
  Filled 2020-03-08 (×4): qty 1

## 2020-03-08 MED ORDER — SODIUM CHLORIDE 0.9 % IV SOLN: INTRAVENOUS | Status: DC

## 2020-03-08 MED ORDER — MORPHINE SULFATE (PF) 2 MG/ML IV SOLN
2.0000 mg | INTRAVENOUS | Status: DC | PRN
  Administered 2020-03-08 – 2020-03-10 (×8): 2 mg via INTRAVENOUS
  Filled 2020-03-08 (×8): qty 1

## 2020-03-08 MED ORDER — LORATADINE 10 MG PO TABS
10.0000 mg | ORAL_TABLET | Freq: Every day | ORAL | Status: DC
  Administered 2020-03-10: 10 mg via ORAL
  Filled 2020-03-08 (×2): qty 1

## 2020-03-08 MED ORDER — INSULIN ASPART 100 UNIT/ML ~~LOC~~ SOLN
0.0000 [IU] | Freq: Three times a day (TID) | SUBCUTANEOUS | Status: DC
  Administered 2020-03-08: 20 [IU] via SUBCUTANEOUS
  Administered 2020-03-08: 15 [IU] via SUBCUTANEOUS
  Administered 2020-03-09: 20 [IU] via SUBCUTANEOUS
  Filled 2020-03-08: qty 0.2

## 2020-03-08 MED ORDER — POTASSIUM CHLORIDE CRYS ER 20 MEQ PO TBCR
20.0000 meq | EXTENDED_RELEASE_TABLET | Freq: Every day | ORAL | Status: DC
  Administered 2020-03-09: 20 meq via ORAL
  Filled 2020-03-08: qty 1

## 2020-03-08 MED ORDER — ONDANSETRON HCL 4 MG/2ML IJ SOLN
4.0000 mg | Freq: Four times a day (QID) | INTRAMUSCULAR | Status: DC | PRN
  Administered 2020-03-08 – 2020-03-09 (×4): 4 mg via INTRAVENOUS
  Filled 2020-03-08 (×4): qty 2

## 2020-03-08 MED ORDER — HYDROCORTISONE (PERIANAL) 2.5 % EX CREA
TOPICAL_CREAM | Freq: Two times a day (BID) | CUTANEOUS | Status: DC
  Filled 2020-03-08: qty 30

## 2020-03-08 MED ORDER — PROMETHAZINE HCL 25 MG/ML IJ SOLN
25.0000 mg | Freq: Once | INTRAMUSCULAR | Status: AC
  Administered 2020-03-08: 25 mg via INTRAVENOUS
  Filled 2020-03-08: qty 1

## 2020-03-08 MED ORDER — SODIUM CHLORIDE 0.9 % IV BOLUS
1000.0000 mL | Freq: Once | INTRAVENOUS | Status: AC
  Administered 2020-03-08: 1000 mL via INTRAVENOUS

## 2020-03-08 MED ORDER — HYDROCORTISONE 2.5 % EX CREA: TOPICAL_CREAM | Freq: Two times a day (BID) | CUTANEOUS | Status: DC

## 2020-03-08 NOTE — ED Notes (Addendum)
Gave patient something to drink. Patient stating she is nauseas and wants more pain medication.

## 2020-03-08 NOTE — Progress Notes (Signed)
MD updated regarding that Pt is a very difficult lab stick and pt was just stick several times for previous lab orders and new lab orders placed by MD. Pt is refusing to be stuck again for these new lab orders placed. MD is also aware of Pt's yellow muse

## 2020-03-08 NOTE — TOC Progression Note (Signed)
Transition of Care [facility]) - Progression Note    Patient Details  Name: Jill Shaw MRN: [id number] Date of Birth: 05/09/1976  [facility]) CM/SW Contact  Geni Bers, RN Phone Number: 03/08/2020, 11:47 AM  Clinical Narrative:    TOC will continue to follow pt for discharge.    Expected Discharge Plan: Home/Self Care Barriers to Discharge: No Barriers Identified  Expected Discharge Plan and Services Expected Discharge Plan: Home/Self Care       Living arrangements for the past 2 months: Single Family Home                                       Social Determinants of Health (SDOH) Interventions    Readmission Risk Interventions No flowsheet data found.

## 2020-03-08 NOTE — ED Notes (Signed)
Patient in ct 

## 2020-03-08 NOTE — ED Notes (Signed)
Unable to obtain Blood culture x 2 or Troponin at this time; attempted to pull off both IVs and no success. Patient refusing to let anyone stick her for additional blood at this time.

## 2020-03-08 NOTE — ED Provider Notes (Signed)
Patient seen/examined in the Emergency Department in conjunction with [facility] Notte  Patient reports diffuse abdominal pain Exam : awake/alert, appears uncomfortable, moving around in the bed and diffuse abdominal tenderness Plan: will proceed CT dissection protocol     Zadie Rhine, MD 03/08/20 0117

## 2020-03-08 NOTE — Progress Notes (Signed)
Call on call [facility] coverage about pt pain and nausea.  Ordered feny and 12.5 mg phenergan once

## 2020-03-08 NOTE — Progress Notes (Signed)
Inpatient Diabetes Program Recommendations  AACE/ADA: New Consensus Statement on Inpatient Glycemic Control (2015)  Target Ranges:  Prepandial:   less than 140 mg/dL      Peak postprandial:   less than 180 mg/dL (1-2 hours)      Critically ill patients:  140 - 180 mg/dL   Lab Results  Component Value Date   GLUCAP 336 (H) 03/08/2020   HGBA1C 9.5 (H) 01/30/2020    Review of Glycemic Control  Diabetes history: DM2 Outpatient Diabetes medications: Humalog 75/25 100 units bid,Onglyza 2.5 mg QD Current orders for Inpatient glycemic control: Novolog 0-20 units tidwc  HgbA1C - 9.5% Will need part of basal insulin.   Inpatient Diabetes Program Recommendations:     Add Lantus 20 units Q24H.  Speak with pt about HgbA1C of 9.5% and goal of 7%.  Follow closely.  Thank you. Ailene Ards, RD, LDN, CDE Inpatient Diabetes Coordinator [phone]

## 2020-03-08 NOTE — Progress Notes (Signed)
Following for Sepsis Protocol by Elink, LA elevated of 3.6, HTN, Pt refusing blood cultures, Abx's given, Follow up Lactic is 1.6, Fluids given and documented on MAR, will continue to follow

## 2020-03-08 NOTE — ED Notes (Signed)
Patient refused to be stuck again to get a set of cultures. Patient IV's is not given enough blood to get blood cultures.

## 2020-03-08 NOTE — Progress Notes (Signed)
   03/08/20 1055  Assess: MEWS Score  BP (!) 178/86  Pulse Rate (!) 107  Resp (!) 24  SpO2 98 %  O2 Device Room Air  Assess: MEWS Score  MEWS Temp 0  MEWS Systolic 0  MEWS Pulse 1  MEWS RR 1  MEWS LOC 0  MEWS Score 2  MEWS Score Color Yellow  Assess: if the MEWS score is Yellow or Red  Were vital signs taken at a resting state? Yes  Focused Assessment Documented focused assessment  Early Detection of Sepsis Score *See Row Information* Medium  MEWS guidelines implemented *See Row Information* Yes  Treat  MEWS Interventions Administered prn meds/treatments  Take Vital Signs  Increase Vital Sign Frequency  Yellow: Q 2hr X 2 then Q 4hr X 2, if remains yellow, continue Q 4hrs  Escalate  MEWS: Escalate Yellow: discuss with charge nurse/RN and consider discussing with provider and RRT  Notify: Charge Nurse/RN  Name of Charge Nurse/RN Notified Marissa Long RN  Date Charge Nurse/RN Notified 03/08/20  Time Charge Nurse/RN Notified 1105  Notify: Provider  Provider Name/Title Dr. Elwyn Lade  Date Provider Notified 03/08/20  Time Provider Notified 1106  Notification Type Page  Notification Reason Other (Comment) (admitted from ER yellow MEWS)  Response See new orders  Date of Provider Response 03/08/20  Time of Provider Response 1106

## 2020-03-08 NOTE — ED Notes (Signed)
CRITICAL VALUE STICKER  CRITICAL VALUE: Lactic 3.6  RECEIVER (on-site recipient of call): Leta Jungling T RN  DATE & TIME NOTIFIED: 03/08/20 0111  MESSENGER (representative from lab): Vince  PROVIDER NOTIFIED: Cyndia Skeeters PA  TIME OF NOTIFICATION: 0112  RESPONSE: see orders

## 2020-03-08 NOTE — ED Notes (Signed)
Patient refusing rectal temp 

## 2020-03-08 NOTE — ED Notes (Signed)
Pt refused a rectal tempature.

## 2020-03-08 NOTE — H&P (Addendum)
History and Physical    Jill Shaw:[id number] DOB: 1976-05-27 DOA: 03/07/2020  PCP: Vevelyn Francois, NP   Patient coming from: Home  I have personally briefly reviewed patient's old medical records in [place]  Chief Complaint: Abdominal pain  HPI: Jill Shaw is a 44 y.o. female with medical history significant for morbid obesity, hypertension, diabetes mellitus and history of abnormal uterine bleeding who presents to the emergency room with complaints of abdominal pain mostly in the periumbilical area which started the day prior to her admission.  She rates her pain a 10 x 10 in intensity at its worst, describes it as spasms and denies any radiation of the pain.  Pain is associated with nausea and poor oral intake. She had a normal bowel movement one day prior to his admission but denies having any emesis.  Patient has had abdominal pain chronically associated with her abnormal uterine bleeding but states that it got worse yesterday.  She was recommended Tylenol for pain control but has been hesitant to take it because she has hepatosteatosis, she has also been on nonsteroidal anti-inflammatory agents intermittently without any improvement in her pain. She received a Depo-Provera shot on May 20 per recommendation from her gynecologist for her abnormal uterine bleeding. Labs revealed elevated lactate level of 3.6 which shows a downward trend.  Blood glucose was 262. CT scan of abdomen and pelvis shows no acute thoracic, abdominal or pelvic pathology. Specifically, no evidence for aortic dissection or aneurysm. Heterogeneous hepatic steatosis.  ED Course: Patient seen in the emergency room for evaluation of diffuse abdominal pain.  Imaging done in the emergency room did not show any acute findings but patient continues to have pain.  Will refer to the hospitalist service for further evaluation  Review of Systems: As per HPI otherwise 10 point review of systems negative.     Past Medical History:  Diagnosis Date  . Anemia   . Arthritis    knees, hands  . Asthma   . COPD (chronic obstructive pulmonary disease) ([place])   . Diabetes mellitus without complication ([place])    type 2  . Dysfunctional uterine bleeding   . GERD (gastroesophageal reflux disease)   . Hypertension   . Neuromuscular disorder (HCC)    neuropathy feet  . Seizures ([place]) 09/12/2017   pt states r/t stress and blood sugar - no meds last one 4 months ago, not seen neurologist  . Sickle cell trait ([place])   . Smoker   . Vitamin D deficiency 10/2019  . Wears glasses     Past Surgical History:  Procedure Laterality Date  . CESAREAN SECTION     x 1  . DILATION AND CURETTAGE OF UTERUS N/A 08/20/2019   Procedure: DILATATION AND CURETTAGE;  Surgeon: Emily Filbert, MD;  Location: [facility];  Service: Gynecology;  Laterality: N/A;  . ENDOMETRIAL ABLATION N/A 08/20/2019   Procedure: [place] Ablation;  Surgeon: Emily Filbert, MD;  Location: [facility];  Service: Gynecology;  Laterality: N/A;  . EYE SURGERY Bilateral    laser right and cataract removed left eye  . RADIOLOGY WITH ANESTHESIA N/A 09/16/2019   Procedure: MRI WITH ANESTHESIA   L SPINE WITHOUT CONTRAST, T SPINE WITHOUT CONTRAST , CERVICAL WITHOUT CONTRAST;  Surgeon: Radiologist, Medication, MD;  Location: [facility];  Service: Radiology;  Laterality: N/A;  . TUBAL LIGATION    . UPPER GI ENDOSCOPY  07/2017     reports that she has been smoking cigarettes. She  has a 13.00 pack-year smoking history. She has never used smokeless tobacco. She reports that she does not drink alcohol or use drugs.  Allergies  Allergen Reactions  . Ketoprofen Nausea And Vomiting  . Aspirin Nausea Only  . Gabapentin Nausea And Vomiting    upset stomach  . Ibuprofen Nausea And Vomiting  . Liraglutide Nausea And Vomiting  . Naproxen Nausea And Vomiting  . Omeprazole-Sodium Bicarbonate Nausea And Vomiting  . Sulfa Antibiotics Nausea And Vomiting  . Tramadol Nausea And  Vomiting    stomach upset    Family History  Problem Relation Age of Onset  . Diabetes Mother   . Hypertension Mother      Prior to Admission medications   Medication Sig Start Date End Date Taking? Authorizing Provider  albuterol (PROVENTIL) (2.5 MG/3ML) 0.083% nebulizer solution Take 3 mLs (2.5 mg total) by nebulization every 6 (six) hours as needed for wheezing or shortness of breath. 12/02/19  Yes Vevelyn Francois, NP  albuterol (VENTOLIN HFA) 108 (90 Base) MCG/ACT inhaler Inhale 2 puffs into the lungs every 6 (six) hours as needed for wheezing or shortness of breath. 12/02/19  Yes King, Diona Foley, NP  budesonide-formoterol (SYMBICORT) 160-4.5 MCG/ACT inhaler INHALE 2 PUFFS INTO THE LUNGS 2 (TWO) TIMES DAILY. Patient taking differently: Inhale 2 puffs into the lungs 2 (two) times daily.  12/02/19  Yes Vevelyn Francois, NP  cetirizine (ZYRTEC) 10 MG tablet Take 1 tablet (10 mg total) by mouth daily. 12/02/19  Yes Vevelyn Francois, NP  Cholecalciferol (VITAMIN D-3) 125 MCG (5000 UT) TABS Take 1 tablet by mouth daily. Patient taking differently: Take 5,000 Units by mouth daily.  08/22/19  Yes Hilts, Legrand Como, MD  DULoxetine (CYMBALTA) 20 MG capsule Take 1 capsule (20 mg total) by mouth daily. 02/12/20 04/12/20 Yes Vevelyn Francois, NP  ferrous sulfate 325 (65 FE) MG tablet Take 1 tablet (325 mg total) by mouth 3 (three) times daily with meals. 12/02/19  Yes King, Diona Foley, NP  fluticasone (FLONASE) 50 MCG/ACT nasal spray Place 2 sprays into both nostrils daily. 11/24/19  Yes Vevelyn Francois, NP  furosemide (LASIX) 40 MG tablet Take 1 tablet (40 mg total) by mouth daily. 02/04/20  Yes Thurnell Lose, MD  Glucosamine Sulfate 1000 MG CAPS Take 1 capsule (1,000 mg total) by mouth 2 (two) times daily. 08/22/19  Yes Hilts, Legrand Como, MD  Insulin Lispro Prot & Lispro (HUMALOG MIX 75/25 KWIKPEN) (75-25) 100 UNIT/ML Kwikpen INJECT 100 UNITS EVERY 12 HOURS Patient taking differently: Inject 100 Units into the skin  every 12 (twelve) hours.  12/02/19  Yes Vevelyn Francois, NP  Lido-Capsaicin-Men-Methyl Sal 0.5-0.035-5-20 % PTCH Apply 1 patch topically daily. Patient taking differently: Apply 1 patch topically daily as needed (pain).  12/18/19 03/17/20 Yes King, Diona Foley, NP  losartan (COZAAR) 50 MG tablet Take 1 tablet (50 mg total) by mouth daily. 02/06/20  Yes Thurnell Lose, MD  metoprolol succinate (TOPROL-XL) 100 MG 24 hr tablet Take 1 tablet (100 mg total) by mouth daily. Take with or immediately following a meal. 12/26/19  Yes Vevelyn Francois, NP  omeprazole (PRILOSEC) 40 MG capsule Take 1 capsule (40 mg total) by mouth daily. 12/02/19  Yes King, Diona Foley, NP  potassium chloride SA (KLOR-CON) 20 MEQ tablet Take 1 tablet (20 mEq total) by mouth daily. 02/04/20  Yes Thurnell Lose, MD  saxagliptin HCl (ONGLYZA) 2.5 MG TABS tablet Take 1 tablet (2.5 mg total) by mouth daily.  12/02/19  Yes King, Diona Foley, NP  tizanidine (ZANAFLEX) 6 MG capsule TAKE 1 CAPSULE BY MOUTH 3 TIMES DAILY. Patient taking differently: Take 6 mg by mouth 3 (three) times daily.  02/18/20  Yes Vevelyn Francois, NP  Turmeric 500 MG CAPS Take 500 mg by mouth 2 (two) times daily. 08/22/19  Yes Hilts, Legrand Como, MD  Vitamin D, Ergocalciferol, (DRISDOL) 1.25 MG (50000 UNIT) CAPS capsule Take 1 capsule (50,000 Units total) by mouth every 7 (seven) days. Patient taking differently: Take 50,000 Units by mouth every Friday.  12/02/19  Yes Vevelyn Francois, NP  blood glucose meter kit and supplies KIT Dispense based on patient and insurance preference. Use up to four times daily as directed. (FOR ICD-9 250.00, 250.01). Patient taking differently: 1 each by Other route See admin instructions. Dispense based on patient and insurance preference. Use up to four times daily as directed. (FOR ICD-9 250.00, 250.01). 02/12/20   Vevelyn Francois, NP  Blood Glucose Monitoring Suppl (TRUE METRIX METER) w/Device KIT 1 each by Does not apply route 4 (four) times daily -   before meals and at bedtime. 01/21/18   Dorena Dew, FNP  Blood Pressure Monitoring (BLOOD PRESSURE KIT) DEVI Check BP BID prn Patient taking differently: 1 each by Other route 2 (two) times daily as needed (blood pressure).  02/12/20   Vevelyn Francois, NP  glucose blood (TRUE METRIX BLOOD GLUCOSE TEST) test strip Use as instructed Patient taking differently: 1 each by Other route See admin instructions. Use as instructed 01/21/18   Dorena Dew, FNP  hydrocortisone 2.5 % cream APPLY TOPICALLY 2 (TWO) TIMES DAILY. 03/04/20   Vevelyn Francois, NP  Insulin Pen Needle (PEN NEEDLES) 30G X 5 MM MISC 1 Units by Does not apply route as directed. 12/02/19   Vevelyn Francois, NP  norethindrone (AYGESTIN) 5 MG tablet Take 2 tablets (10 mg total) by mouth every 8 (eight) hours. Patient not taking: Reported on 03/02/2020 12/31/19 03/02/20  Aletha Halim, MD  TRUEplus Lancets 28G MISC 1 each by Other route in the morning, at noon, in the evening, and at bedtime. 02/12/20   [provider]  hydrALAZINE (APRESOLINE) 50 MG tablet Take 1 tablet (50 mg total) by mouth 3 (three) times daily. 02/03/20 02/20/20  Thurnell Lose, MD    Physical Exam: Vitals:   03/08/20 0800 03/08/20 0815 03/08/20 0830 03/08/20 0845  BP: (!) 160/77 (!) 153/81 (!) 168/80 (!) 164/65  Pulse: 95 (!) 104 95 (!) 105  Resp: 16 (!) 21 20 (!) 26  Temp:      TempSrc:      SpO2: 96% 97% 97% 93%  Weight:      Height:         Vitals:   03/08/20 0800 03/08/20 0815 03/08/20 0830 03/08/20 0845  BP: (!) 160/77 (!) 153/81 (!) 168/80 (!) 164/65  Pulse: 95 (!) 104 95 (!) 105  Resp: 16 (!) 21 20 (!) 26  Temp:      TempSrc:      SpO2: 96% 97% 97% 93%  Weight:      Height:        Constitutional: NAD, alert and oriented x 3, Morbidly obese Eyes: PERRL, lids and conjunctivae normal, pale conjunctiva ENMT: Mucous membranes are moist.  Neck: normal, supple, no masses, no thyromegaly Respiratory: clear to auscultation bilaterally,  no wheezing, no crackles. Normal respiratory effort. No accessory muscle use.  Cardiovascular: Tachycardia, no murmurs / rubs /  gallops. No extremity edema. 2+ pedal pulses. No carotid bruits.  Abdomen: Tenderness periumbilical , no masses palpated. No hepatosplenomegaly. Bowel sounds positive.  Central adiposity Musculoskeletal: no clubbing / cyanosis. No joint deformity upper and lower extremities.  Skin: no rashes, lesions, ulcers.  Neurologic: No gross focal neurologic deficit. Psychiatric: Normal mood and affect.   Labs on Admission: I have personally reviewed following labs and imaging studies  CBC: Recent Labs  Lab 03/02/20 2052 03/07/20 2142  WBC 12.0* 11.5*  NEUTROABS 8.3*  --   HGB 9.6* 10.3*  HCT 31.0* 32.9*  MCV 78.5* 78.3*  PLT 303 631   Basic Metabolic Panel: Recent Labs  Lab 03/02/20 2052 03/07/20 2142  NA 140 139  K 4.2 3.7  CL 101 101  CO2 24 24  GLUCOSE 94 262*  BUN 14 12  CREATININE 1.98* 1.13*  CALCIUM 9.5 9.7   GFR: Estimated Creatinine Clearance: 82.9 mL/min (A) (by C-G formula based on SCr of 1.13 mg/dL (H)). Liver Function Tests: Recent Labs  Lab 03/02/20 2052 03/07/20 2142  AST 72* 35  ALT 22 15  ALKPHOS 72 98  BILITOT 0.6 0.7  PROT 6.7 8.1  ALBUMIN 3.5 4.3   Recent Labs  Lab 03/07/20 2142  LIPASE 24   No results for input(s): AMMONIA in the last 168 hours. Coagulation Profile: Recent Labs  Lab 03/07/20 2142  INR 1.1   Cardiac Enzymes: No results for input(s): CKTOTAL, CKMB, CKMBINDEX, TROPONINI in the last 168 hours. BNP (last 3 results) No results for input(s): PROBNP in the last 8760 hours. HbA1C: No results for input(s): HGBA1C in the last 72 hours. CBG: No results for input(s): GLUCAP in the last 168 hours. Lipid Profile: No results for input(s): CHOL, HDL, LDLCALC, TRIG, CHOLHDL, LDLDIRECT in the last 72 hours. Thyroid Function Tests: No results for input(s): TSH, T4TOTAL, FREET4, T3FREE, THYROIDAB in the last 72  hours. Anemia Panel: No results for input(s): VITAMINB12, FOLATE, FERRITIN, TIBC, IRON, RETICCTPCT in the last 72 hours. Urine analysis:    Component Value Date/Time   COLORURINE YELLOW 03/07/2020 2142   APPEARANCEUR CLEAR 03/07/2020 2142   LABSPEC 1.027 03/07/2020 2142   PHURINE 5.0 03/07/2020 2142   GLUCOSEU >=500 (A) 03/07/2020 2142   HGBUR NEGATIVE 03/07/2020 2142   BILIRUBINUR NEGATIVE 03/07/2020 2142   BILIRUBINUR NEGATIVE 11/05/2019 1342   [place] 03/07/2020 2142   PROTEINUR 30 (A) 03/07/2020 2142   UROBILINOGEN 0.2 11/05/2019 1342   UROBILINOGEN 0.2 01/14/2018 1348   NITRITE NEGATIVE 03/07/2020 2142   LEUKOCYTESUR NEGATIVE 03/07/2020 2142    Radiological Exams on Admission: DG Chest Port 1 View  Result Date: 03/08/2020 CLINICAL DATA:  Abdominal pain, nausea and vomiting EXAM: PORTABLE CHEST 1 VIEW COMPARISON:  Radiograph 02/20/2020 FINDINGS: Low volumes with increased gradient attenuation towards the bases likely reflecting a combination of atelectatic change and body habitus accentuated by portable technique. More hazy interstitial opacities with central vascular congestion is similar to prior as well as fissural thickening. Stable cardiomegaly. No pneumothorax or visible effusion. No acute osseous or soft tissue abnormality. Telemetry leads overlie the chest. IMPRESSION: 1. Low lung volumes with basilar atelectasis. 2. Cardiomegaly and central vascular congestion could suggest CHF with mild interstitial edema. Electronically Signed   By: Lovena Le M.D.   On: 03/08/2020 00:31   CT Angio Chest/Abd/Pel for Dissection W and/or Wo Contrast  Result Date: 03/08/2020 CLINICAL DATA:  Abdominal pain with nausea and vomiting. EXAM: CT ANGIOGRAPHY CHEST, ABDOMEN AND PELVIS TECHNIQUE: Non-contrast CT  of the chest was initially obtained. Multidetector CT imaging through the chest, abdomen and pelvis was performed using the standard protocol during bolus administration of  intravenous contrast. Multiplanar reconstructed images and MIPs were obtained and reviewed to evaluate the vascular anatomy. CONTRAST:  131m OMNIPAQUE IOHEXOL 350 MG/ML SOLN COMPARISON:  01/31/2020 FINDINGS: CTA CHEST FINDINGS Cardiovascular: There is no evidence for thoracic aortic dissection or aneurysm. There is no large centrally located pulmonary embolism. The heart size is stable. There is no significant pericardial effusion Mediastinum/Nodes: --No mediastinal or hilar lymphadenopathy. --No axillary lymphadenopathy. --No supraclavicular lymphadenopathy. --Normal thyroid gland. --The esophagus is unremarkable Lungs/Pleura: No pulmonary nodules or masses. No pleural effusion or pneumothorax. No focal airspace consolidation. No focal pleural abnormality. Musculoskeletal: No chest wall abnormality. No acute or significant osseous findings. Review of the MIP images confirms the above findings. CTA ABDOMEN AND PELVIS FINDINGS VASCULAR Aorta: Normal caliber aorta without aneurysm, dissection, vasculitis or significant stenosis. Celiac: Patent without evidence of aneurysm, dissection, vasculitis or significant stenosis. SMA: Patent without evidence of aneurysm, dissection, vasculitis or significant stenosis. Renals: Both renal arteries are patent without evidence of aneurysm, dissection, vasculitis, fibromuscular dysplasia or significant stenosis. IMA: Patent without evidence of aneurysm, dissection, vasculitis or significant stenosis. Inflow: Patent without evidence of aneurysm, dissection, vasculitis or significant stenosis. Veins: No obvious venous abnormality within the limitations of this arterial phase study. Review of the MIP images confirms the above findings. NON-VASCULAR Hepatobiliary: Again noted is heterogeneous hepatic steatosis. Normal gallbladder.There is no biliary ductal dilation. Pancreas: Normal contours without ductal dilatation. No peripancreatic fluid collection. Spleen: Unremarkable.  Adrenals/Urinary Tract: --Adrenal glands: Unremarkable. --Right kidney/ureter: No hydronephrosis or radiopaque kidney stones. --Left kidney/ureter: No hydronephrosis or radiopaque kidney stones. --Urinary bladder: Unremarkable. Stomach/Bowel: --Stomach/Duodenum: No hiatal hernia or other gastric abnormality. Normal duodenal course and caliber. --Small bowel: Unremarkable. --Colon: Unremarkable. --Appendix: Normal. Vascular/Lymphatic: Normal course and caliber of the major abdominal vessels. --No retroperitoneal lymphadenopathy. --No mesenteric lymphadenopathy. --No pelvic or inguinal lymphadenopathy. Reproductive: Unremarkable Other: No ascites or free air. The abdominal wall is normal. Musculoskeletal. No acute displaced fractures. Review of the MIP images confirms the above findings. IMPRESSION: 1. No acute thoracic, abdominal or pelvic pathology. Specifically, no evidence for aortic dissection or aneurysm. 2. Heterogeneous hepatic steatosis. Electronically Signed   By: CConstance HolsterM.D.   On: 03/08/2020 02:14    EKG: Independently reviewed.   Assessment/Plan Principal Problem:   Abdominal pain Active Problems:   Type 2 diabetes mellitus without complication, with long-term current use of insulin (HCC)   Essential hypertension   Morbid obesity (HCC)   Tobacco dependence   Anemia of chronic disease   Seizure (HCC)   Chronic diastolic CHF (congestive heart failure) (HCC)    Abdominal pain Acute on chronic Etiology unclear Continue pain control Start patient on clear liquid diet and advance as tolerated Trial of Bentyl   Morbid obesity (BMI 429.79 Complicates overall care   Nicotine dependence Smoking cessation has been discussed with patient in detail We will place patient on a nicotine transdermal patch 14 mg daily   Chronic diastolic dysfunction CHF Continue diuretic therapy, metoprolol and Cozaar   Diabetes mellitus Maintain clear liquid diet and advance as  tolerated Place patient on sliding scale coverage   Anemia of chronic disease Related to abnormal uterine bleeding Continue iron supplements H&H is stable   History of COPD Continue as needed bronchodilator therapy and inhaled steroids   Probable Sepsis (POA) Patient with elevated lactate level on admission which has  normalized (3.5 >> 1.6) She had a white count but no obvious source of infection at this time Awaiting results of UA Patient declined blood culture drawn in the ER will receive empiric antibiotic therapy Judicious IV fluid hydration due to history of CHF with chest x-ray findings suggestive of mild interstitial edema  DVT prophylaxis: Lovenox Code Status: Full Family Communication: Greater than 50% of time was spent discussing plan of care with patient at the bedside. All questions and concerns have been addressed Disposition Plan: Back to previous home environment Consults called: None    Zakeria Kulzer MD [facility]     03/08/2020, 9:10 AM

## 2020-03-08 NOTE — Progress Notes (Signed)
Called [facility] on call Toniann Fail MD about PT CBG still 365 with no night time coverage and BP 169/68 and hydralazine ordered with no parameters.  Md ordered one time Novalog 8 units and check CBG in 4 hrs, and hydralazine for SBP greater than 160

## 2020-03-08 NOTE — Plan of Care (Signed)

## 2020-03-08 NOTE — Progress Notes (Signed)
Pt remains with mild to moderate nausea with minimal po intake. Pt has only tolerated small amounts of ginger ale. Pt did at this time request to attempt to take oral blood pressure medications. Pt did swallow Medications but became very nauseated after taking Medications and Pt states "I only wanted my blood pressure Medications right now nothing else

## 2020-03-08 NOTE — ED Notes (Signed)
Patient requesting more pain medication and nausea medication. PA made aware.

## 2020-03-09 LAB — BASIC METABOLIC PANEL
Anion gap: 14 (ref 5–15)
BUN: 15 mg/dL (ref 6–20)
CO2: 20 mmol/L — ABNORMAL LOW (ref 22–32)
Calcium: 9.1 mg/dL (ref 8.9–10.3)
Chloride: 105 mmol/L (ref 98–111)
Creatinine, Ser: 1.3 mg/dL — ABNORMAL HIGH (ref 0.44–1.00)
GFR calc Af Amer: 58 mL/min — ABNORMAL LOW (ref >60)
GFR calc non Af Amer: 50 mL/min — ABNORMAL LOW (ref >60)
Glucose, Bld: 407 mg/dL — ABNORMAL HIGH (ref 70–99)
Potassium: 3.9 mmol/L (ref 3.5–5.1)
Sodium: 139 mmol/L (ref 135–145)

## 2020-03-09 LAB — CBC
HCT: 29.8 % — ABNORMAL LOW (ref 36.0–46.0)
Hemoglobin: 9.4 g/dL — ABNORMAL LOW (ref 12.0–15.0)
MCH: 24.9 pg — ABNORMAL LOW (ref 26.0–34.0)
MCHC: 31.5 g/dL (ref 30.0–36.0)
MCV: 79 fL — ABNORMAL LOW (ref 80.0–100.0)
Platelets: 233 10*3/uL (ref 150–400)
RBC: 3.77 MIL/uL — ABNORMAL LOW (ref 3.87–5.11)
RDW: 22.4 % — ABNORMAL HIGH (ref 11.5–15.5)
WBC: 13.5 10*3/uL — ABNORMAL HIGH (ref 4.0–10.5)
nRBC: 0 % (ref 0.0–0.2)

## 2020-03-09 LAB — URINE CULTURE: Culture: >=100000 — AB

## 2020-03-09 LAB — GLUCOSE, CAPILLARY
Glucose-Capillary: 288 mg/dL — ABNORMAL HIGH (ref 70–99)
Glucose-Capillary: 320 mg/dL — ABNORMAL HIGH (ref 70–99)
Glucose-Capillary: 377 mg/dL — ABNORMAL HIGH (ref 70–99)
Glucose-Capillary: 380 mg/dL — ABNORMAL HIGH (ref 70–99)
Glucose-Capillary: 407 mg/dL — ABNORMAL HIGH (ref 70–99)

## 2020-03-09 LAB — GLUCOSE, RANDOM: Glucose, Bld: 406 mg/dL — ABNORMAL HIGH (ref 70–99)

## 2020-03-09 MED ORDER — PANTOPRAZOLE SODIUM 40 MG IV SOLR
40.0000 mg | Freq: Two times a day (BID) | INTRAVENOUS | Status: DC
  Administered 2020-03-09 – 2020-03-10 (×2): 40 mg via INTRAVENOUS
  Filled 2020-03-09 (×2): qty 40

## 2020-03-09 MED ORDER — SODIUM CHLORIDE 0.9 % IV SOLN
1000.0000 mL | INTRAVENOUS | Status: DC
  Administered 2020-03-09: 1000 mL via INTRAVENOUS

## 2020-03-09 MED ORDER — SODIUM CHLORIDE 0.9 % IV SOLN
2.0000 g | INTRAVENOUS | Status: DC
  Filled 2020-03-09: qty 20

## 2020-03-09 MED ORDER — INSULIN ASPART 100 UNIT/ML ~~LOC~~ SOLN
8.0000 [IU] | Freq: Once | SUBCUTANEOUS | Status: AC
  Administered 2020-03-09: 8 [IU] via SUBCUTANEOUS

## 2020-03-09 MED ORDER — METOCLOPRAMIDE HCL 5 MG/ML IJ SOLN
5.0000 mg | Freq: Three times a day (TID) | INTRAMUSCULAR | Status: DC
  Administered 2020-03-09 – 2020-03-10 (×2): 5 mg via INTRAVENOUS
  Filled 2020-03-09 (×2): qty 2

## 2020-03-09 MED ORDER — TIZANIDINE HCL 4 MG PO TABS
6.0000 mg | ORAL_TABLET | Freq: Three times a day (TID) | ORAL | Status: DC
  Administered 2020-03-09 – 2020-03-10 (×3): 6 mg via ORAL
  Filled 2020-03-09 (×3): qty 2

## 2020-03-09 MED ORDER — ACETAMINOPHEN 325 MG PO TABS
650.0000 mg | ORAL_TABLET | Freq: Four times a day (QID) | ORAL | Status: DC
  Administered 2020-03-09 – 2020-03-10 (×2): 650 mg via ORAL
  Filled 2020-03-09: qty 2

## 2020-03-09 MED ORDER — METRONIDAZOLE IN NACL 5-0.79 MG/ML-% IV SOLN
500.0000 mg | Freq: Three times a day (TID) | INTRAVENOUS | Status: DC
  Administered 2020-03-09: 500 mg via INTRAVENOUS
  Filled 2020-03-09: qty 100

## 2020-03-09 MED ORDER — HYDRALAZINE HCL 20 MG/ML IJ SOLN
10.0000 mg | Freq: Once | INTRAMUSCULAR | Status: AC
  Administered 2020-03-09: 10 mg via INTRAVENOUS

## 2020-03-09 MED ORDER — PROMETHAZINE HCL 25 MG/ML IJ SOLN
12.5000 mg | INTRAMUSCULAR | Status: DC | PRN
  Administered 2020-03-09 – 2020-03-10 (×3): 12.5 mg via INTRAVENOUS
  Filled 2020-03-09 (×3): qty 1

## 2020-03-09 MED ORDER — ACETAMINOPHEN 325 MG PO TABS
650.0000 mg | ORAL_TABLET | Freq: Four times a day (QID) | ORAL | Status: DC | PRN
  Filled 2020-03-09: qty 2

## 2020-03-09 MED ORDER — INSULIN ASPART 100 UNIT/ML ~~LOC~~ SOLN
10.0000 [IU] | Freq: Once | SUBCUTANEOUS | Status: AC
  Administered 2020-03-09: 10 [IU] via SUBCUTANEOUS

## 2020-03-09 MED ORDER — INSULIN ASPART 100 UNIT/ML ~~LOC~~ SOLN
0.0000 [IU] | Freq: Three times a day (TID) | SUBCUTANEOUS | Status: DC
  Administered 2020-03-09: 8 [IU] via SUBCUTANEOUS

## 2020-03-09 MED ORDER — INSULIN DETEMIR 100 UNIT/ML ~~LOC~~ SOLN
20.0000 [IU] | Freq: Every day | SUBCUTANEOUS | Status: DC
  Administered 2020-03-09: 20 [IU] via SUBCUTANEOUS
  Filled 2020-03-09 (×2): qty 0.2

## 2020-03-09 MED ORDER — ALUM & MAG HYDROXIDE-SIMETH 200-200-20 MG/5ML PO SUSP
30.0000 mL | ORAL | Status: DC | PRN
  Administered 2020-03-09: 30 mL via ORAL
  Filled 2020-03-09 (×2): qty 30

## 2020-03-09 NOTE — Progress Notes (Signed)
   03/09/20 0750  Assess: MEWS Score  Temp (!) 100.7 F (38.2 C)  BP (!) 166/75  ECG Heart Rate (!) 119  Resp (!) 54  Level of Consciousness Alert  SpO2 99 %  O2 Device Room Air  Assess: MEWS Score  MEWS Temp 1  MEWS Systolic 0  MEWS Pulse 2  MEWS RR 3  MEWS LOC 0  MEWS Score 6  MEWS Score Color Red  Assess: if the MEWS score is Yellow or Red  Were vital signs taken at a resting state? Yes  Focused Assessment Documented focused assessment  Early Detection of Sepsis Score *See Row Information* High  MEWS guidelines implemented *See Row Information* Yes  Treat  MEWS Interventions Escalated (See documentation below) (MD and CN made aware)  Take Vital Signs  Increase Vital Sign Frequency  Red: Q 1hr X 4 then Q 4hr X 4, if remains red, continue Q 4hrs  Escalate  MEWS: Escalate Red: discuss with charge nurse/RN and provider, consider discussing with RRT  Notify: Charge Nurse/RN  Name of Charge Nurse/RN Notified Shalaya Swailes RN  Date Charge Nurse/RN Notified 03/09/20  Time Charge Nurse/RN Notified 0800  Notify: Provider  Provider Name/Title Dr. Ella Jubilee  Date Provider Notified 03/09/20  Time Provider Notified 0800  Notification Type Page  Notification Reason Change in status (RED MEWS)  Response Other (Comment) (coming to see patient)  Date of Provider Response 03/09/20  Time of Provider Response 0800   RN called CN to room due to RED MEWS.  MD paged and states he will see patient. Will request order for tylenol for fever.  Waiting on IV team to establish better access to give meds to control nausea and BP/pulse. RED VS algorithm activated.

## 2020-03-09 NOTE — Progress Notes (Signed)
   03/09/20 0107  Assess: MEWS Score  BP (!) 171/82  Pulse Rate (!) 128  Assess: MEWS Score  MEWS Temp 0  MEWS Systolic 0  MEWS Pulse 2  MEWS RR 0  MEWS LOC 0  MEWS Score 2  MEWS Score Color Yellow  Assess: if the MEWS score is Yellow or Red  Were vital signs taken at a resting state? Yes  Early Detection of Sepsis Score *See Row Information* Low   Pt HR and BP have been high since admission, yellow MEWS criteria started on days, Pt continues to be yellow for BP and HR, continue to monitor VS q4h

## 2020-03-09 NOTE — Progress Notes (Addendum)
PROGRESS NOTE  Jill Shaw:[id number] DOB: 1975/12/28 DOA: 03/07/2020 PCP: Jill Merino, NP   LOS: 1 day   Brief Narrative / Interim history: Jill Shaw is a 44 y.o. A.A. female with a medical history significant for morbid obesity, hypertension, diabetes mellitus, chronic anemia in the setting of abnormal uterine bleeding, CKD, COPD, GERD, seizures and sickle cell trait who presents to the ED with complaints of acute on chronic abdominal pain with intractable nausea with vomiting and poor oral intake.   Lactic acid 3.6 on admission, currently 1.6. Blood and urine cultures pending.   Patient has had chronic abdominal pain associated with her abnormal uterine bleeding but states this episode is worse. CT scan abdomen pelvis on 5/24 notable for hepatic steatosis only.   Subjective / 24h Interval events: Patient appears in pain on morning rounds. Per nursing staff, she has been refusing medications except for hydralizine as well as blood draws. IV team was able to establish access this AM.   Assessment & Plan: Principal Problem Acute on chronic abdominal pain - Patient has chronic abdominal pain associated with abnormal uterine bleeding. CT scan thoracic/abdomen/pelvis without acute findings. Unknown etiology at this time. Patient remains nauseated with vomiting. Continue Bentyl 10mg , sucralfate 0.5 mg. Manage nausea with ondansetron PRN and pain with acetaminophen and morphine PRN.   Active Problems Sepsis - Patient with elevated lactate on admission (3.6>>1.6) with WBC 13.5 and trending up. Blood cultures pending. Broad spectrum therapy with Ceftriaxone 2g IV initiated on 5/24. Continue ceftriaxone and Flagyl IV 500 mg. Gentle IVF at 50 ml/Hr due to vascular congestion and interstitial edema. Diuresis for CHF with Furosemide 40 mg.   Type 2 diabetes mellitus - glucosuria >500 on 5/24 and CGB 406 5/25.  Maintain clear liquid diet and advance as tolerated to carb-modified det.  Sliding scale insulin with novalog.   Essential hypertension - EKG on 5/24 with findings or sinus tachycardia and non-specific T wave changes. Patient denies CP or SOB. She had been hypertensive prior to and since admission with SBP consitantly >160, tachycardia and tachypnea. MUSE red per nursing protocol. Administer Hydralazine if SBP >160. Continue metoprolol 100 mg. Maintain cardiac telemetry.   Tobacco dependence - Council pt on smoking cessation. Apply nicotine transdermal patch 14 mg daily.  Anemia of chronic disease in the setting of abnormal uterine bleeding - Patient is followed by GYN and was administered Depo-provera on 5/18. H&H stable. Continue ferrous sulfate 325 mg.  Seizures - Monitor for seizure activity. Maintain seizure precautions per unit protocol.  CHF - CXR findings of central vascular congestion with mild interstitial edema on 5/23. Patient remains edematous in lower and upper extremities. Continue diuresis with furosemide 40 mg, metoprolol 100 mg and Cozaar 50 mg .   COPD  - continue as needed bronchodilator therapy with Dulera and albuterol PRN.   GERD - Continue pantoprazole 40 mg, alum & mag hydroxide-simethicone PRN.  Morbid obesity - BMI 46.35. Complicates overall care. Encourage out of bed tid and consult PT and OT.    Scheduled Meds: . dicyclomine  10 mg Oral TID AC & HS  . DULoxetine  20 mg Oral Daily  . enoxaparin (LOVENOX) injection  60 mg Subcutaneous Q24H  . ferrous sulfate  325 mg Oral TID WC  . furosemide  40 mg Intravenous Daily  . hydrocortisone   Topical BID  . insulin aspart  0-20 Units Subcutaneous TID WC  . loratadine  10 mg Oral Daily  . losartan  50  mg Oral Daily  . metoprolol succinate  100 mg Oral Daily  . mometasone-formoterol  2 puff Inhalation BID  . nicotine  14 mg Transdermal Daily  . norethindrone  10 mg Oral TID  . pantoprazole  40 mg Oral Daily  . potassium chloride SA  20 mEq Oral Daily  . sucralfate  0.5 g Oral TID WC & HS   . [START ON 03/12/2020] Vitamin D (Ergocalciferol)  50,000 Units Oral Q7 days   Continuous Infusions: . sodium chloride Stopped (03/08/20 0819)  . sodium chloride 50 mL/hr at 03/08/20 1034   PRN Meds:.acetaminophen, albuterol, alum & mag hydroxide-simeth, hydrALAZINE, morphine injection, ondansetron **OR** ondansetron (ZOFRAN) IV  DVT prophylaxis: Enoxaparin Code Status: Full code Family Communication: No family at besdise  Status is: Inpatient  Remains inpatient appropriate because:Inpatient level of care appropriate due to severity of illness   Dispo: The patient is from: Home              Anticipated d/c is to: Home              Anticipated d/c date is: 2 days              Patient currently is not medically stable to d/c.    Consultants:  None  Procedures:  2D echo: None Foley: None BiPAP: None HD: None  Microbiology  Blood cultures pending.  Antimicrobials: Ceftriaxone    Objective: Vitals:   03/09/20 0750 03/09/20 0755 03/09/20 0815 03/09/20 0923  BP: (!) 166/75  (!) 162/78 (!) 160/85  Pulse:    (!) 117  Resp: (!) 54   (!) 26  Temp: (!) 100.7 F (38.2 C)   99 F (37.2 C)  TempSrc: Oral     SpO2: 99% 99% 99% 99%  Weight:      Height:        Intake/Output Summary (Last 24 hours) at 03/09/2020 1001 Last data filed at 03/09/2020 0441 Gross per 24 hour  Intake 528.77 ml  Output 2900 ml  Net -2371.23 ml   Filed Weights   03/07/20 2124  Weight: 122.5 kg    Examination:  Constitutional: NAD Eyes: no scleral icterus ENMT: Mucous membranes are moist.  Neck: normal, supple Respiratory: clear to auscultation bilaterally, no wheezing, no crackles. Normal respiratory effort. No accessory muscle use.  Cardiovascular: Regular rate and rhythm, no murmurs / rubs / gallops. Mild bilateral LE edema. Good peripheral pulses Abdomen: distended, diffusely moderately tender to palpation. Bowel sounds positive.  Musculoskeletal: no clubbing / cyanosis.  Skin: no  rashes Neurologic: grossly non-focal. Psychiatric: Normal judgment and insight. Alert and oriented x 3. Normal mood.    Data Reviewed: I have independently reviewed following labs and imaging studies   CBC: Recent Labs  Lab 03/02/20 2052 03/07/20 2142 03/08/20 1048 03/09/20 0444  WBC 12.0* 11.5* 10.4 13.5*  NEUTROABS 8.3*  --  7.4  --   HGB 9.6* 10.3* 9.1* 9.4*  HCT 31.0* 32.9* 29.8* 29.8*  MCV 78.5* 78.3* 80.3 79.0*  PLT 303 237 165 161   Basic Metabolic Panel: Recent Labs  Lab 03/02/20 2052 03/07/20 2142 03/09/20 0444 03/09/20 0825  NA 140 139 139  --   K 4.2 3.7 3.9  --   CL 101 101 105  --   CO2 24 24 20*  --   GLUCOSE 94 262* 407* 406*  BUN 14 12 15   --   CREATININE 1.98* 1.13* 1.30*  --   CALCIUM 9.5 9.7 9.1  --  Liver Function Tests: Recent Labs  Lab 03/02/20 2052 03/07/20 2142  AST 72* 35  ALT 22 15  ALKPHOS 72 98  BILITOT 0.6 0.7  PROT 6.7 8.1  ALBUMIN 3.5 4.3   Coagulation Profile: Recent Labs  Lab 03/07/20 2142  INR 1.1   HbA1C: No results for input(s): HGBA1C in the last 72 hours. CBG: Recent Labs  Lab 03/08/20 1051 03/08/20 1633 03/08/20 2011 03/09/20 0000 03/09/20 0800  GLUCAP 336* 367* 365* 377* 407*    Recent Results (from the past 240 hour(s))  Urine culture     Status: None (Preliminary result)   Collection Time: 03/07/20  9:42 PM   Specimen: In/Out Cath Urine  Result Value Ref Range Status   Specimen Description   Final    IN/OUT CATH URINE Performed at [facility], [address], [place], [place] [address]    Special Requests   Final    NONE Performed at [facility], [address], [place], [place] [address]    Culture   Final    CULTURE REINCUBATED FOR BETTER GROWTH Performed at [facility], [address] [place], [place] [address]    Report Status PENDING  Incomplete  SARS Coronavirus 2 by RT PCR (hospital order, performed in [facility] hospital lab)  Nasopharyngeal Nasopharyngeal Swab     Status: None   Collection Time: 03/08/20  7:04 AM   Specimen: Nasopharyngeal Swab  Result Value Ref Range Status   SARS Coronavirus 2 NEGATIVE NEGATIVE Final    Comment: (NOTE) SARS-CoV-2 target nucleic acids are NOT DETECTED. The SARS-CoV-2 RNA is generally detectable in upper and lower respiratory specimens during the acute phase of infection. The lowest concentration of SARS-CoV-2 viral copies this assay can detect is 250 copies / mL. A negative result does not preclude SARS-CoV-2 infection and should not be used as the sole basis for treatment or other patient management decisions.  A negative result may occur with improper specimen collection / handling, submission of specimen other than nasopharyngeal swab, presence of viral mutation(s) within the areas targeted by this assay, and inadequate number of viral copies (<250 copies / mL). A negative result must be combined with clinical observations, patient history, and epidemiological information. Fact Sheet for Patients:   BoilerBrush.com.cy Fact Sheet for Healthcare Providers: https://pope.com/ This test is not yet approved or cleared  by the [place] FDA and has been authorized for detection and/or diagnosis of SARS-CoV-2 by FDA under an Emergency Use Authorization (EUA).  This EUA will remain in effect (meaning this test can be used) for the duration of the COVID-19 declaration under Section 564(b)(1) of the Act, 21 U.S.C. section 360bbb-3(b)(1), unless the authorization is terminated or revoked sooner. Performed at [facility], [address], [place], [place] [address]   Blood Culture (routine x 2)     Status: None (Preliminary result)   Collection Time: 03/08/20 10:50 AM   Specimen: BLOOD RIGHT HAND  Result Value Ref Range Status   Specimen Description   Final    BLOOD RIGHT HAND Performed at [facility], [address], [place], [place] [address]    Special Requests   Final    BOTTLES DRAWN AEROBIC AND ANAEROBIC Blood Culture adequate volume Performed at [facility], [address], [place], [place] [address]    Culture   Final    NO GROWTH <12 HOURS Performed at [facility], [address], [place], [place] [address]    Report Status PENDING  Incomplete  Blood Culture (routine x 2)     Status: None (Preliminary result)   Collection Time: 03/08/20 10:52 AM   Specimen: BLOOD LEFT HAND  Result Value Ref Range Status   Specimen Description   Final    BLOOD LEFT HAND Performed at [facility], [address] [place], [place] [address]    Special Requests   Final    BOTTLES DRAWN AEROBIC AND ANAEROBIC Blood Culture adequate volume Performed at [facility], [address], [place], [place] [address]    Culture   Final    NO GROWTH <12 HOURS Performed at [facility], [address], [place], [place] [address]    Report Status PENDING  Incomplete     Radiology Studies: No results found.

## 2020-03-09 NOTE — Progress Notes (Signed)
Meredeth Ide and Lauris Chroman for [employer] selected visitors.

## 2020-03-09 NOTE — Progress Notes (Signed)
RN taking over shift at 11am. Red mews. MD is aware. Nausea feels better and able to tolerate some of her PO meds. Morning dose of Metoprolol given PO. RN will monitor patient and vitals closely.

## 2020-03-09 NOTE — Progress Notes (Signed)
VAST consulted to obtain IV access. Assessed pt's current access which had kink as it appears deep; redressed IV and propped up on gauze. Blood return obtained and flushed easily. Pt is edematous, but no increased swelling noted with flushing and pt verbalized no burning. Educated pt's RN to measure arm in same area periodically to check for infiltration.  Offered to move IV to a better location, but patient refused.

## 2020-03-09 NOTE — Progress Notes (Signed)
Provider made aware of blood glucose resulting 320 and no nighttime coverage ordered. Orders received for insulin and will recheck in 4 hours.

## 2020-03-09 NOTE — Progress Notes (Signed)
PROGRESS NOTE    Jill Shaw  JJK:[id number] DOB: 06-05-1976 DOA: 03/07/2020 PCP: Vevelyn Francois, NP    Brief Narrative:  Patient was admitted to the hospital with working diagnosis of intractable abdominal pain.  44 year old female who presents with abdominal pain, she does have a significant past medical history for morbid obesity, hypertension, type 2 diabetes mellitus and abnormal uterine bleeding.  Reported periumbilical severe sharp abdominal pain, associated with nausea, vomiting and decreased p.o. intake.  She has been experiencing chronic abdominal pain related to urine bleeding.  On her initial physical examination blood pressure 160/77, heart rate 95, respiratory rate 16, oxygen saturation 93%, lungs are clear to auscultation bilaterally, heart S1-S2, present tachycardic, his abdomen was tender to palpation, no rebound or guarding, no lower extremity edema. Sodium 139, potassium 3.7, chloride 101, bicarb 24, glucose 262, BUN 12, creatinine 1.1, lipase 24, white count 11.5, hemoglobin 10.3, hematocrit 32.9, platelets 237, venous lactic acid 3.6. SARS COVID-19 was negative.  Troponin I 5, 4 , urinalysis negative for infection.  Toxicology screen negative.  Chest x-ray with hyperinflation, underpenetrated, no infiltrates.  CT chest, abdomen pelvis negative for acute changes.  EKG, 96 bpm, normal axis, normal intervals, sinus rhythm, poor R wave progression, no ST segment changes, inferior (II, III, AvF) new T wave inversions.  Patient has been placed on supportive medical therapy with IV fluids, antiacids, analgesics and antiemetics.   Assessment & Plan:   Principal Problem:   Abdominal pain Active Problems:   Type 2 diabetes mellitus without complication, with long-term current use of insulin (HCC)   Essential hypertension   Morbid obesity (HCC)   Tobacco dependence   Anemia of chronic disease   Seizure (HCC)   Chronic diastolic CHF (congestive heart failure) ([place])   1.  Intractable abdominal pain, suspected gastritis/ ruled out for sepsis. Will continue supportive medical care with IV fluids d5 NS at 75 ml per H, antiacid therapy with IV pantoprazole 40 q 12, po sucralfate qid and antiemetics (phenergan and metoclopramide) and as needed morphine for pain control.  If patient continues to have nausea and vomiting, patient may need further diagnostic endoscopy.  No signs of bacterial infection, will continue to hold on antibiotic therapy for now.   Continue bentyl   2. T2DM with hyperglycemia. Serum glucose up to 406, patient with no po intake. At home patient on insulin 70/25 100 units bid.   Will continue glucose cover with basal insulin 20 units and insulin sliding scale (moderate intensity), for glucose cover and monitoring.   3. HTH with diastolic heart failure. Continue metoprolol for blood pressure control, will hold on losartan for now.  No signs of heart failure exacerbation.   4. COPD. No signs of exacerbation, continue oxymetry monitoring. Continue as needed albuterol and scheduled dulera.   5. Chronic anemia with iron deficiency. Hgb and Hct are stable, continue cell count monitoring. Continue oral iron.   6. Obesity class 3. depresion and tobacco abuse. Calculated BMI is 46,3, will continue smoking cessation counseling/ nicotine patch.   Continue duloxetine and tizanidine.   Status is: Inpatient  Remains inpatient appropriate because:IV treatments appropriate due to intensity of illness or inability to take PO   Dispo: The patient is from: Home              Anticipated d/c is to: Home              Anticipated d/c date is: 2 days  Patient currently is not medically stable to d/c.   DVT prophylaxis:  enoxaparin   Code Status:    full  Family Communication:  No family at the bedside       Subjective: Patient continue to have persistent nausea and vomiting, positive epigastric abdominal pain, no diarrhea or vaginal  bleeding, poor appetite and poor oral intake.   Objective: Vitals:   03/09/20 0923 03/09/20 1030 03/09/20 1156 03/09/20 1335  BP: (!) 160/85 (!) 175/85 (!) 146/73 (!) 157/74  Pulse: (!) 117 (!) 116 (!) 116 (!) 105  Resp: (!) 26 (!) 30 (!) 28 (!) 24  Temp: 99 F (37.2 C) 99.1 F (37.3 C) 99.6 F (37.6 C) 98.6 F (37 C)  TempSrc:   Oral Oral  SpO2: 99% 98% 99% 100%  Weight:      Height:        Intake/Output Summary (Last 24 hours) at 03/09/2020 1445 Last data filed at 03/09/2020 1337 Gross per 24 hour  Intake 528.77 ml  Output 3700 ml  Net -3171.23 ml   Filed Weights   03/07/20 2124  Weight: 122.5 kg    Examination:   General: deconditioned and in pain,  Neurology: Awake and alert, non focal  E ENT: milf pallor, no icterus, oral mucosa dry Cardiovascular: No JVD. S1-S2 present, rhythmic, no gallops, rubs, or murmurs. No lower extremity edema. Pulmonary: positive breath sounds bilaterally, adequate air movement, no wheezing, rhonchi or rales. Gastrointestinal. Abdomen mild distended but soft with, no organomegaly, no rebound or guarding. Tender to deep palpation.  Skin. No rashes Musculoskeletal: no joint deformities     Data Reviewed: I have personally reviewed following labs and imaging studies  CBC: Recent Labs  Lab 03/02/20 2052 03/07/20 2142 03/08/20 1048 03/09/20 0444  WBC 12.0* 11.5* 10.4 13.5*  NEUTROABS 8.3*  --  7.4  --   HGB 9.6* 10.3* 9.1* 9.4*  HCT 31.0* 32.9* 29.8* 29.8*  MCV 78.5* 78.3* 80.3 79.0*  PLT 303 237 165 233   Basic Metabolic Panel: Recent Labs  Lab 03/02/20 2052 03/07/20 2142 03/09/20 0444 03/09/20 0825  NA 140 139 139  --   K 4.2 3.7 3.9  --   CL 101 101 105  --   CO2 24 24 20*  --   GLUCOSE 94 262* 407* 406*  BUN 14 12 15   --   CREATININE 1.98* 1.13* 1.30*  --   CALCIUM 9.5 9.7 9.1  --    GFR: Estimated Creatinine Clearance: 72.1 mL/min (A) (by C-G formula based on SCr of 1.3 mg/dL (H)). Liver Function  Tests: Recent Labs  Lab 03/02/20 2052 03/07/20 2142  AST 72* 35  ALT 22 15  ALKPHOS 72 98  BILITOT 0.6 0.7  PROT 6.7 8.1  ALBUMIN 3.5 4.3   Recent Labs  Lab 03/07/20 2142  LIPASE 24   No results for input(s): AMMONIA in the last 168 hours. Coagulation Profile: Recent Labs  Lab 03/07/20 2142  INR 1.1   Cardiac Enzymes: No results for input(s): CKTOTAL, CKMB, CKMBINDEX, TROPONINI in the last 168 hours. BNP (last 3 results) No results for input(s): PROBNP in the last 8760 hours. HbA1C: No results for input(s): HGBA1C in the last 72 hours. CBG: Recent Labs  Lab 03/08/20 1633 03/08/20 2011 03/09/20 0000 03/09/20 0800 03/09/20 1156  GLUCAP 367* 365* 377* 407* 380*   Lipid Profile: No results for input(s): CHOL, HDL, LDLCALC, TRIG, CHOLHDL, LDLDIRECT in the last 72 hours. Thyroid Function Tests: No results for  input(s): TSH, T4TOTAL, FREET4, T3FREE, THYROIDAB in the last 72 hours. Anemia Panel: No results for input(s): VITAMINB12, FOLATE, FERRITIN, TIBC, IRON, RETICCTPCT in the last 72 hours.    Radiology Studies: I have reviewed all of the imaging during this hospital visit personally     Scheduled Meds: . acetaminophen  650 mg Oral Q6H  . dicyclomine  10 mg Oral TID AC & HS  . DULoxetine  20 mg Oral Daily  . enoxaparin (LOVENOX) injection  60 mg Subcutaneous Q24H  . ferrous sulfate  325 mg Oral TID WC  . hydrocortisone   Topical BID  . insulin aspart  0-20 Units Subcutaneous TID WC  . loratadine  10 mg Oral Daily  . metoCLOPramide (REGLAN) injection  5 mg Intravenous TID AC  . metoprolol succinate  100 mg Oral Daily  . mometasone-formoterol  2 puff Inhalation BID  . nicotine  14 mg Transdermal Daily  . norethindrone  10 mg Oral TID  . pantoprazole (PROTONIX) IV  40 mg Intravenous Q12H  . sucralfate  0.5 g Oral TID WC & HS  . tiZANidine  6 mg Oral TID  . [START ON 03/12/2020] Vitamin D (Ergocalciferol)  50,000 Units Oral Q7 days   Continuous  Infusions: . sodium chloride 75 mL/hr at 03/09/20 1334     LOS: 1 day        Kaysey Berndt Annett Gula, MD

## 2020-03-10 ENCOUNTER — Ambulatory Visit: Payer: Self-pay | Admitting: Nurse Practitioner

## 2020-03-10 ENCOUNTER — Telehealth: Payer: Self-pay | Admitting: Nurse Practitioner

## 2020-03-10 DIAGNOSIS — I10 Essential (primary) hypertension: Secondary | ICD-10-CM

## 2020-03-10 DIAGNOSIS — I5032 Chronic diastolic (congestive) heart failure: Secondary | ICD-10-CM

## 2020-03-10 DIAGNOSIS — R1033 Periumbilical pain: Secondary | ICD-10-CM

## 2020-03-10 DIAGNOSIS — D638 Anemia in other chronic diseases classified elsewhere: Secondary | ICD-10-CM

## 2020-03-10 LAB — CBC WITH DIFFERENTIAL/PLATELET
Abs Immature Granulocytes: 0.05 10*3/uL (ref 0.00–0.07)
Basophils Absolute: 0.1 10*3/uL (ref 0.0–0.1)
Basophils Relative: 1 %
Eosinophils Absolute: 0.2 10*3/uL (ref 0.0–0.5)
Eosinophils Relative: 2 %
HCT: 28.5 % — ABNORMAL LOW (ref 36.0–46.0)
Hemoglobin: 8.5 g/dL — ABNORMAL LOW (ref 12.0–15.0)
Immature Granulocytes: 1 %
Lymphocytes Relative: 25 %
Lymphs Abs: 2.4 10*3/uL (ref 0.7–4.0)
MCH: 24.2 pg — ABNORMAL LOW (ref 26.0–34.0)
MCHC: 29.8 g/dL — ABNORMAL LOW (ref 30.0–36.0)
MCV: 81.2 fL (ref 80.0–100.0)
Monocytes Absolute: 0.5 10*3/uL (ref 0.1–1.0)
Monocytes Relative: 5 %
Neutro Abs: 6.6 10*3/uL (ref 1.7–7.7)
Neutrophils Relative %: 66 %
Platelets: 217 10*3/uL (ref 150–400)
RBC: 3.51 MIL/uL — ABNORMAL LOW (ref 3.87–5.11)
RDW: 22.3 % — ABNORMAL HIGH (ref 11.5–15.5)
WBC: 9.7 10*3/uL (ref 4.0–10.5)
nRBC: 0 % (ref 0.0–0.2)

## 2020-03-10 LAB — GLUCOSE, CAPILLARY
Glucose-Capillary: 274 mg/dL — ABNORMAL HIGH (ref 70–99)
Glucose-Capillary: 359 mg/dL — ABNORMAL HIGH (ref 70–99)
Glucose-Capillary: 425 mg/dL — ABNORMAL HIGH (ref 70–99)

## 2020-03-10 LAB — BASIC METABOLIC PANEL
Anion gap: 8 (ref 5–15)
BUN: 17 mg/dL (ref 6–20)
CO2: 23 mmol/L (ref 22–32)
Calcium: 8.6 mg/dL — ABNORMAL LOW (ref 8.9–10.3)
Chloride: 105 mmol/L (ref 98–111)
Creatinine, Ser: 1.25 mg/dL — ABNORMAL HIGH (ref 0.44–1.00)
GFR calc Af Amer: >60 mL/min (ref >60)
GFR calc non Af Amer: 53 mL/min — ABNORMAL LOW (ref >60)
Glucose, Bld: 295 mg/dL — ABNORMAL HIGH (ref 70–99)
Potassium: 3.9 mmol/L (ref 3.5–5.1)
Sodium: 136 mmol/L (ref 135–145)

## 2020-03-10 MED ORDER — INSULIN ASPART 100 UNIT/ML ~~LOC~~ SOLN: 0.0000 [IU] | Freq: Three times a day (TID) | SUBCUTANEOUS | Status: DC

## 2020-03-10 MED ORDER — HYDROCODONE-ACETAMINOPHEN 5-325 MG PO TABS: 2.0000 | ORAL_TABLET | Freq: Four times a day (QID) | ORAL | 0 refills | Status: DC | PRN

## 2020-03-10 MED ORDER — INSULIN ASPART 100 UNIT/ML ~~LOC~~ SOLN
0.0000 [IU] | Freq: Three times a day (TID) | SUBCUTANEOUS | Status: DC
  Administered 2020-03-10: 20 [IU] via SUBCUTANEOUS

## 2020-03-10 MED ORDER — INSULIN ASPART 100 UNIT/ML ~~LOC~~ SOLN: 0.0000 [IU] | Freq: Every day | SUBCUTANEOUS | Status: DC

## 2020-03-10 MED ORDER — INSULIN DETEMIR 100 UNIT/ML ~~LOC~~ SOLN
40.0000 [IU] | Freq: Every day | SUBCUTANEOUS | Status: DC
  Administered 2020-03-10: 40 [IU] via SUBCUTANEOUS
  Filled 2020-03-10: qty 0.4

## 2020-03-10 MED ORDER — PROMETHAZINE HCL 12.5 MG PO TABS
12.5000 mg | ORAL_TABLET | Freq: Four times a day (QID) | ORAL | 0 refills | Status: DC | PRN
Start: 2020-03-10 — End: 2020-11-23

## 2020-03-10 NOTE — Progress Notes (Signed)
Pt to be discharged to home this afternoon. Pt given discharge teaching including Medications and schedules for these Medications. Pt verbalized understanding of all discharge teaching. Discharge packet with pt at time of discharge

## 2020-03-10 NOTE — Discharge Summary (Signed)
Physician Discharge Summary  Jill Shaw:[id number] DOB: 11-12-1975 DOA: 03/07/2020  PCP: Vevelyn Francois, NP  Admit date: 03/07/2020 Discharge date: 03/10/2020  Admitted From: Home Disposition: Home   Recommendations for Outpatient Follow-up:  1. Follow up with PCP in 1-2 weeks 2. Please obtain BMP/CBC in one week  Home Health: None Equipment/Devices: None Discharge Condition: Stable CODE STATUS: Full Diet recommendation: Heart healthy, carb-modified  Brief/Interim Summary: Jill Shaw is a 44 y.o. female who presents with abdominal pain, she does have a significant past medical history for morbid obesity, hypertension, type 2 diabetes mellitus and abnormal uterine bleeding.  Reported periumbilical severe sharp abdominal pain, associated with nausea, vomiting and decreased p.o. intake.  She has been experiencing chronic abdominal pain related to urine bleeding.  On her initial physical examination blood pressure 160/77, heart rate 95, respiratory rate 16, oxygen saturation 93%, lungs are clear to auscultation bilaterally, heart S1-S2, present tachycardic, his abdomen was tender to palpation, no rebound or guarding, no lower extremity edema. Sodium 139, potassium 3.7, chloride 101, bicarb 24, glucose 262, BUN 12, creatinine 1.1, lipase 24, white count 11.5, hemoglobin 10.3, hematocrit 32.9, platelets 237, venous lactic acid 3.6. SARS COVID-19 was negative.  Troponin I 5, 4 , urinalysis negative for infection.  Toxicology screen negative.  Chest x-ray with hyperinflation, underpenetrated, no infiltrates.  CT chest, abdomen pelvis negative for acute changes.  EKG, 96 bpm, normal axis, normal intervals, sinus rhythm, poor R wave progression, no ST segment changes, inferior (II, III, AvF) new T wave inversions.  Patient has been placed on supportive medical therapy with IV fluids, antiacids, analgesics and antiemetics. With this, she improved and requested discharge on 5/26. She is  tolerating a diet and plans to follow up with GI as an outpatient.  Discharge Diagnoses:  Principal Problem:   Abdominal pain Active Problems:   Type 2 diabetes mellitus without complication, with long-term current use of insulin (HCC)   Essential hypertension   Morbid obesity (HCC)   Tobacco dependence   Anemia of chronic disease   Seizure (HCC)   Chronic diastolic CHF (congestive heart failure) (HCC)  Intractable abdominal pain, suspected gastritis/ ruled out for sepsis.  - Improved exam and subjectively, tolerating diet. Will continue antiemetics, carafate, and analgesics.  - Would benefit from GI evaluation nonurgently.  - Continue bentyl   T2DM with hyperglycemia.  - CBGs up as diet/po intake improved, will restart home insulin.  HTN with chronic diastolic heart failure.  - Euvolemic at DC, continue home medications  COPD. No signs of exacerbation. - Continue home meds  Chronic anemia with iron deficiency. Hgb and Hct are stable, continue cell count monitoring.  - Continue oral iron.   Obesity class 3. Calculated BMI is 46.3  Depression  Continue duloxetine and tizanidine.  Tobacco use:  - Cessation counseling provided.  Discharge Instructions Discharge Instructions    Diet - low sodium heart healthy   Complete by: As directed    Discharge instructions   Complete by: As directed    You were admitted for abdominal pain and nausea with vomiting. Though work up has not shown a definite source, you have improved and are stable, able to take medications and tolerate food/fluids. You will be discharged today with prescriptions for pain and nausea sent to [facility]. If your symptoms return, seek medical attention right away. Otherwise, please follow up with your primary doctor. You will need referral to GI doctor for endoscopy/further work up after discharge.  Increase activity slowly   Complete by: As directed      Allergies as of 03/10/2020       Reactions   Ketoprofen Nausea And Vomiting   Aspirin Nausea Only   Gabapentin Nausea And Vomiting   upset stomach   Ibuprofen Nausea And Vomiting   Liraglutide Nausea And Vomiting   Naproxen Nausea And Vomiting   Omeprazole-sodium Bicarbonate Nausea And Vomiting   Sulfa Antibiotics Nausea And Vomiting   Tramadol Nausea And Vomiting   stomach upset      Medication List    STOP taking these medications   norethindrone 5 MG tablet Commonly known as: AYGESTIN     TAKE these medications   albuterol (2.5 MG/3ML) 0.083% nebulizer solution Commonly known as: PROVENTIL Take 3 mLs (2.5 mg total) by nebulization every 6 (six) hours as needed for wheezing or shortness of breath.   albuterol 108 (90 Base) MCG/ACT inhaler Commonly known as: VENTOLIN HFA Inhale 2 puffs into the lungs every 6 (six) hours as needed for wheezing or shortness of breath.   blood glucose meter kit and supplies Kit Dispense based on patient and insurance preference. Use up to four times daily as directed. (FOR ICD-9 250.00, 250.01). What changed:   how much to take  how to take this  when to take this   Blood Pressure Kit Devi Check BP BID prn What changed:   how much to take  how to take this  when to take this  reasons to take this  additional instructions   budesonide-formoterol 160-4.5 MCG/ACT inhaler Commonly known as: Symbicort INHALE 2 PUFFS INTO THE LUNGS 2 (TWO) TIMES DAILY. What changed:   how much to take  how to take this  when to take this  additional instructions   cetirizine 10 MG tablet Commonly known as: ZYRTEC Take 1 tablet (10 mg total) by mouth daily.   ferrous sulfate 325 (65 FE) MG tablet Take 1 tablet (325 mg total) by mouth 3 (three) times daily with meals. Notes to patient: Last Dose of this Medication was given on 03/10/2020 at 7:54 am   fluticasone 50 MCG/ACT nasal spray Commonly known as: FLONASE Place 2 sprays into both nostrils daily.   furosemide  40 MG tablet Commonly known as: LASIX Take 1 tablet (40 mg total) by mouth daily.   Glucosamine Sulfate 1000 MG Caps Take 1 capsule (1,000 mg total) by mouth 2 (two) times daily.   glucose blood test strip Commonly known as: True Metrix Blood Glucose Test Use as instructed What changed:   how much to take  how to take this  when to take this   Insulin Lispro Prot & Lispro (75-25) 100 UNIT/ML Kwikpen Commonly known as: HumaLOG Mix 75/25 KwikPen INJECT 100 UNITS EVERY 12 HOURS What changed:   how much to take  how to take this  when to take this  additional instructions   Lido-Capsaicin-Men-Methyl Sal 0.5-0.035-5-20 % Ptch Apply 1 patch topically daily. What changed:   when to take this  reasons to take this   metoprolol succinate 100 MG 24 hr tablet Commonly known as: TOPROL-XL Take 1 tablet (100 mg total) by mouth daily. Take with or immediately following a meal. Notes to patient: Last Dose of this Medication was given on 03/10/2020 at 10:30 am   omeprazole 40 MG capsule Commonly known as: PRILOSEC Take 1 capsule (40 mg total) by mouth daily.   Onglyza 2.5 MG Tabs tablet Generic drug: saxagliptin HCl Take 1  tablet (2.5 mg total) by mouth daily.   Pen Needles 30G X 5 MM Misc 1 Units by Does not apply route as directed.   potassium chloride SA 20 MEQ tablet Commonly known as: KLOR-CON Take 1 tablet (20 mEq total) by mouth daily.   promethazine 12.5 MG tablet Commonly known as: PHENERGAN Take 1 tablet (12.5 mg total) by mouth every 6 (six) hours as needed for nausea or vomiting.   tizanidine 6 MG capsule Commonly known as: ZANAFLEX TAKE 1 CAPSULE BY MOUTH 3 TIMES DAILY.   True Metrix Meter w/Device Kit 1 each by Does not apply route 4 (four) times daily -  before meals and at bedtime.   TRUEplus Lancets 28G Misc 1 each by Other route in the morning, at noon, in the evening, and at bedtime.   Turmeric 500 MG Caps Take 500 mg by mouth 2 (two) times  daily.   Vitamin D (Ergocalciferol) 1.25 MG (50000 UNIT) Caps capsule Commonly known as: DRISDOL Take 1 capsule (50,000 Units total) by mouth every 7 (seven) days. What changed: when to take this Notes to patient: Please take this medication every 7 days   Vitamin D-3 125 MCG (5000 UT) Tabs Take 1 tablet by mouth daily. What changed: how much to take      Follow-up Information    Vevelyn Francois, NP. Schedule an appointment as soon as possible for a visit in 1 week(s).   Specialty: Adult Health Nurse Practitioner Contact information: [address] [place]enee [place]er [place] [address] [phone]          Allergies  Allergen Reactions  . Ketoprofen Nausea And Vomiting  . Aspirin Nausea Only  . Gabapentin Nausea And Vomiting    upset stomach  . Ibuprofen Nausea And Vomiting  . Liraglutide Nausea And Vomiting  . Naproxen Nausea And Vomiting  . Omeprazole-Sodium Bicarbonate Nausea And Vomiting  . Sulfa Antibiotics Nausea And Vomiting  . Tramadol Nausea And Vomiting    stomach upset    Consultations:  None  Procedures/Studies: DG Chest Port 1 View  Result Date: 03/08/2020 CLINICAL DATA:  Abdominal pain, nausea and vomiting EXAM: PORTABLE CHEST 1 VIEW COMPARISON:  Radiograph 02/20/2020 FINDINGS: Low volumes with increased gradient attenuation towards the bases likely reflecting a combination of atelectatic change and body habitus accentuated by portable technique. More hazy interstitial opacities with central vascular congestion is similar to prior as well as fissural thickening. Stable cardiomegaly. No pneumothorax or visible effusion. No acute osseous or soft tissue abnormality. Telemetry leads overlie the chest. IMPRESSION: 1. Low lung volumes with basilar atelectasis. 2. Cardiomegaly and central vascular congestion could suggest CHF with mild interstitial edema. Electronically Signed   By: Lovena Le M.D.   On: 03/08/2020 00:31   DG Chest Portable 1 View  Result Date:  02/20/2020 CLINICAL DATA:  Unresponsive. EXAM: PORTABLE CHEST 1 VIEW COMPARISON:  01/29/2020. FINDINGS: Cardiomegaly. Mild pulmonary venous congestion and interstitial prominence. Mild CHF cannot be excluded. Low lung volumes. Mild atelectatic changes right mid lung. No acute bony abnormality. IMPRESSION: Cardiomegaly with mild pulmonary venous congestion. Mild bilateral interstitial prominence. Mild CHF cannot be excluded. Low lung volumes. Mild atelectatic changes right mid lung. Electronically Signed   By: Marcello Moores  Register   On: 02/20/2020 12:54   CT Angio Chest/Abd/Pel for Dissection W and/or Wo Contrast  Result Date: 03/08/2020 CLINICAL DATA:  Abdominal pain with nausea and vomiting. EXAM: CT ANGIOGRAPHY CHEST, ABDOMEN AND PELVIS TECHNIQUE: Non-contrast CT of the chest was initially obtained.  Multidetector CT imaging through the chest, abdomen and pelvis was performed using the standard protocol during bolus administration of intravenous contrast. Multiplanar reconstructed images and MIPs were obtained and reviewed to evaluate the vascular anatomy. CONTRAST:  172m OMNIPAQUE IOHEXOL 350 MG/ML SOLN COMPARISON:  01/31/2020 FINDINGS: CTA CHEST FINDINGS Cardiovascular: There is no evidence for thoracic aortic dissection or aneurysm. There is no large centrally located pulmonary embolism. The heart size is stable. There is no significant pericardial effusion Mediastinum/Nodes: --No mediastinal or hilar lymphadenopathy. --No axillary lymphadenopathy. --No supraclavicular lymphadenopathy. --Normal thyroid gland. --The esophagus is unremarkable Lungs/Pleura: No pulmonary nodules or masses. No pleural effusion or pneumothorax. No focal airspace consolidation. No focal pleural abnormality. Musculoskeletal: No chest wall abnormality. No acute or significant osseous findings. Review of the MIP images confirms the above findings. CTA ABDOMEN AND PELVIS FINDINGS VASCULAR Aorta: Normal caliber aorta without aneurysm,  dissection, vasculitis or significant stenosis. Celiac: Patent without evidence of aneurysm, dissection, vasculitis or significant stenosis. SMA: Patent without evidence of aneurysm, dissection, vasculitis or significant stenosis. Renals: Both renal arteries are patent without evidence of aneurysm, dissection, vasculitis, fibromuscular dysplasia or significant stenosis. IMA: Patent without evidence of aneurysm, dissection, vasculitis or significant stenosis. Inflow: Patent without evidence of aneurysm, dissection, vasculitis or significant stenosis. Veins: No obvious venous abnormality within the limitations of this arterial phase study. Review of the MIP images confirms the above findings. NON-VASCULAR Hepatobiliary: Again noted is heterogeneous hepatic steatosis. Normal gallbladder.There is no biliary ductal dilation. Pancreas: Normal contours without ductal dilatation. No peripancreatic fluid collection. Spleen: Unremarkable. Adrenals/Urinary Tract: --Adrenal glands: Unremarkable. --Right kidney/ureter: No hydronephrosis or radiopaque kidney stones. --Left kidney/ureter: No hydronephrosis or radiopaque kidney stones. --Urinary bladder: Unremarkable. Stomach/Bowel: --Stomach/Duodenum: No hiatal hernia or other gastric abnormality. Normal duodenal course and caliber. --Small bowel: Unremarkable. --Colon: Unremarkable. --Appendix: Normal. Vascular/Lymphatic: Normal course and caliber of the major abdominal vessels. --No retroperitoneal lymphadenopathy. --No mesenteric lymphadenopathy. --No pelvic or inguinal lymphadenopathy. Reproductive: Unremarkable Other: No ascites or free air. The abdominal wall is normal. Musculoskeletal. No acute displaced fractures. Review of the MIP images confirms the above findings. IMPRESSION: 1. No acute thoracic, abdominal or pelvic pathology. Specifically, no evidence for aortic dissection or aneurysm. 2. Heterogeneous hepatic steatosis. Electronically Signed   By: CConstance Holster M.D.   On: 03/08/2020 02:14      Subjective: Feels well, eating better, no abdominal pain at this time, requesting discharge.   Discharge Exam: Vitals:   03/10/20 1030 03/10/20 1126  BP: 135/60   Pulse: 76   Resp:    Temp:    SpO2:  97%   General: Pt is alert, awake, not in acute distress Cardiovascular: RRR, S1/S2 +, no rubs, no gallops Respiratory: CTA bilaterally, no wheezing, no rhonchi Abdominal: Soft, NT, ND, bowel sounds + Extremities: No edema, no cyanosis  Labs: BNP (last 3 results) Recent Labs    02/01/20 0255 02/02/20 0220 02/03/20 0341  BNP 348.3* 277.7* 1947.0   Basic Metabolic Panel: Recent Labs  Lab 03/07/20 2142 03/09/20 0444 03/09/20 0825 03/10/20 0432  NA 139 139  --  136  K 3.7 3.9  --  3.9  CL 101 105  --  105  CO2 24 20*  --  23  GLUCOSE 262* 407* 406* 295*  BUN 12 15  --  17  CREATININE 1.13* 1.30*  --  1.25*  CALCIUM 9.7 9.1  --  8.6*   Liver Function Tests: Recent Labs  Lab 03/07/20 2142  AST 35  ALT 15  ALKPHOS 98  BILITOT 0.7  PROT 8.1  ALBUMIN 4.3   Recent Labs  Lab 03/07/20 2142  LIPASE 24   No results for input(s): AMMONIA in the last 168 hours. CBC: Recent Labs  Lab 03/07/20 2142 03/08/20 1048 03/09/20 0444 03/10/20 0432  WBC 11.5* 10.4 13.5* 9.7  NEUTROABS  --  7.4  --  6.6  HGB 10.3* 9.1* 9.4* 8.5*  HCT 32.9* 29.8* 29.8* 28.5*  MCV 78.3* 80.3 79.0* 81.2  PLT 237 165 233 217   Cardiac Enzymes: No results for input(s): CKTOTAL, CKMB, CKMBINDEX, TROPONINI in the last 168 hours. BNP: Invalid input(s): POCBNP CBG: Recent Labs  Lab 03/09/20 1627 03/09/20 2042 03/10/20 0233 03/10/20 0731 03/10/20 0932  GLUCAP 288* 320* 274* 359* 425*   D-Dimer No results for input(s): DDIMER in the last 72 hours. Hgb A1c No results for input(s): HGBA1C in the last 72 hours. Lipid Profile No results for input(s): CHOL, HDL, LDLCALC, TRIG, CHOLHDL, LDLDIRECT in the last 72 hours. Thyroid function studies No  results for input(s): TSH, T4TOTAL, T3FREE, THYROIDAB in the last 72 hours.  Invalid input(s): FREET3 Anemia work up No results for input(s): VITAMINB12, FOLATE, FERRITIN, TIBC, IRON, RETICCTPCT in the last 72 hours. Urinalysis    Component Value Date/Time   COLORURINE STRAW (A) 03/08/2020 1040   APPEARANCEUR CLEAR 03/08/2020 1040   LABSPEC 1.030 03/08/2020 1040   PHURINE 6.0 03/08/2020 1040   GLUCOSEU >=500 (A) 03/08/2020 1040   HGBUR NEGATIVE 03/08/2020 1040   BILIRUBINUR NEGATIVE 03/08/2020 1040   BILIRUBINUR NEGATIVE 11/05/2019 1342   KETONESUR 20 (A) 03/08/2020 1040   PROTEINUR NEGATIVE 03/08/2020 1040   UROBILINOGEN 0.2 11/05/2019 1342   UROBILINOGEN 0.2 01/14/2018 1348   NITRITE NEGATIVE 03/08/2020 1040   LEUKOCYTESUR NEGATIVE 03/08/2020 1040    Microbiology Recent Results (from the past 240 hour(s))  Urine culture     Status: Abnormal   Collection Time: 03/07/20  9:42 PM   Specimen: In/Out Cath Urine  Result Value Ref Range Status   Specimen Description   Final    IN/OUT CATH URINE Performed at [facility], [place] [address] [place], [place] [address]    Special Requests   Final    NONE Performed at [facility], [place] [address] [place], [place] [address]    Culture (A)  Final    >=100,000 COLONIES/mL GROUP B STREP(S.AGALACTIAE)ISOLATED TESTING AGAINST S. AGALACTIAE NOT ROUTINELY PERFORMED DUE TO PREDICTABILITY OF AMP/PEN/VAN SUSCEPTIBILITY. Performed at [facility], [place] [address], [place], [place] [address]    Report Status 03/09/2020 FINAL  Final  SARS Coronavirus 2 by RT PCR (hospital order, performed in [facility] hospital lab) Nasopharyngeal Nasopharyngeal Swab     Status: None   Collection Time: 03/08/20  7:04 AM   Specimen: Nasopharyngeal Swab  Result Value Ref Range Status   SARS Coronavirus 2 NEGATIVE NEGATIVE Final    Comment: (NOTE) SARS-CoV-2 target nucleic acids are NOT DETECTED. The SARS-CoV-2 RNA  is generally detectable in upper and lower respiratory specimens during the acute phase of infection. The lowest concentration of SARS-CoV-2 viral copies this assay can detect is 250 copies / mL. A negative result does not preclude SARS-CoV-2 infection and should not be used as the sole basis for treatment or other patient management decisions.  A negative result may occur with improper specimen collection / handling, submission of specimen other than nasopharyngeal swab, presence of viral mutation(s) within the areas targeted by this assay, and inadequate  number of viral copies (<250 copies / mL). A negative result must be combined with clinical observations, patient history, and epidemiological information. Fact Sheet for Patients:   StrictlyIdeas.no Fact Sheet for Healthcare Providers: BankingDealers.co.za This test is not yet approved or cleared  by the [place] FDA and has been authorized for detection and/or diagnosis of SARS-CoV-2 by FDA under an Emergency Use Authorization (EUA).  This EUA will remain in effect (meaning this test can be used) for the duration of the COVID-19 declaration under Section 564(b)(1) of the Act, 21 U.S.C. section 360bbb-3(b)(1), unless the authorization is terminated or revoked sooner. Performed at [facility], [place] [address], [place], [place] [address]   Blood Culture (routine x 2)     Status: None (Preliminary result)   Collection Time: 03/08/20 10:50 AM   Specimen: BLOOD RIGHT HAND  Result Value Ref Range Status   Specimen Description   Final    BLOOD RIGHT HAND Performed at [place] [address] [place], [place] [address]    Special Requests   Final    BOTTLES DRAWN AEROBIC AND ANAEROBIC Blood Culture adequate volume Performed at [place] [address] [place], [place] [address]    Culture   Final    NO GROWTH 4  DAYS Performed at [facility], [place] [address] [place], [place] [address]    Report Status PENDING  Incomplete  Blood Culture (routine x 2)     Status: None (Preliminary result)   Collection Time: 03/08/20 10:52 AM   Specimen: BLOOD LEFT HAND  Result Value Ref Range Status   Specimen Description   Final    BLOOD LEFT HAND Performed at [place] [address], [place], [place] [address]    Special Requests   Final    BOTTLES DRAWN AEROBIC AND ANAEROBIC Blood Culture adequate volume Performed at [place] [address] [place], [place] [address]    Culture   Final    NO GROWTH 4 DAYS Performed at [facility], [place] [address] [place], [place] [address]    Report Status PENDING  Incomplete    Time coordinating discharge: Approximately 40 minutes  Patrecia Pour, MD  [facility] 03/12/2020, 2:39 PM

## 2020-03-10 NOTE — Telephone Encounter (Signed)
Pt wants you to know that she recently had x-rays done at [facility] hospital and you should be able to see them.

## 2020-03-11 ENCOUNTER — Telehealth: Payer: Self-pay | Admitting: Cardiovascular Disease

## 2020-03-11 ENCOUNTER — Ambulatory Visit: Payer: Self-pay | Admitting: Pulmonary Disease

## 2020-03-11 NOTE — Telephone Encounter (Signed)
-----   Message from Darene Lamer, LPN sent at 07/10/9323  5:14 PM EDT ----- Regarding: appointments. Patient needs an ECHO scheduled.  Follow up with an APP in 2 months.  And follow up with PharmD in office in 4 weeks.  Thank you!

## 2020-03-11 NOTE — Telephone Encounter (Signed)
LMTCB to schedule appt with PharmD. See note below.

## 2020-03-12 ENCOUNTER — Encounter: Payer: Self-pay | Admitting: Nurse Practitioner

## 2020-03-12 ENCOUNTER — Ambulatory Visit: Payer: Self-pay | Admitting: Primary Care

## 2020-03-12 ENCOUNTER — Ambulatory Visit (INDEPENDENT_AMBULATORY_CARE_PROVIDER_SITE_OTHER): Payer: Self-pay | Admitting: Nurse Practitioner

## 2020-03-12 ENCOUNTER — Other Ambulatory Visit: Payer: Self-pay

## 2020-03-12 ENCOUNTER — Other Ambulatory Visit: Payer: Self-pay | Admitting: Nurse Practitioner

## 2020-03-12 VITALS — Ht 64.0 in | Wt 260.0 lb

## 2020-03-12 DIAGNOSIS — F321 Major depressive disorder, single episode, moderate: Secondary | ICD-10-CM

## 2020-03-12 DIAGNOSIS — E1165 Type 2 diabetes mellitus with hyperglycemia: Secondary | ICD-10-CM

## 2020-03-12 DIAGNOSIS — IMO0002 Reserved for concepts with insufficient information to code with codable children: Secondary | ICD-10-CM

## 2020-03-12 DIAGNOSIS — J449 Chronic obstructive pulmonary disease, unspecified: Secondary | ICD-10-CM

## 2020-03-12 DIAGNOSIS — E1122 Type 2 diabetes mellitus with diabetic chronic kidney disease: Secondary | ICD-10-CM

## 2020-03-12 DIAGNOSIS — R200 Anesthesia of skin: Secondary | ICD-10-CM

## 2020-03-12 DIAGNOSIS — R202 Paresthesia of skin: Secondary | ICD-10-CM

## 2020-03-12 DIAGNOSIS — M79604 Pain in right leg: Secondary | ICD-10-CM

## 2020-03-12 DIAGNOSIS — M79605 Pain in left leg: Secondary | ICD-10-CM

## 2020-03-12 DIAGNOSIS — L309 Dermatitis, unspecified: Secondary | ICD-10-CM

## 2020-03-12 DIAGNOSIS — I10 Essential (primary) hypertension: Secondary | ICD-10-CM

## 2020-03-12 MED ORDER — LOSARTAN POTASSIUM 50 MG PO TABS: 50.0000 mg | ORAL_TABLET | Freq: Every day | ORAL | 2 refills | Status: DC

## 2020-03-12 MED ORDER — DULOXETINE HCL 40 MG PO CPEP: 20.0000 mg | ORAL_CAPSULE | Freq: Every day | ORAL | 2 refills | Status: DC

## 2020-03-12 MED ORDER — DULOXETINE HCL 40 MG PO CPEP
40.0000 mg | ORAL_CAPSULE | Freq: Every day | ORAL | 2 refills | Status: DC
Start: 2020-03-12 — End: 2020-06-28

## 2020-03-12 MED ORDER — HYDROCORTISONE 2.5 % EX CREA: TOPICAL_CREAM | Freq: Two times a day (BID) | CUTANEOUS | 2 refills | Status: DC

## 2020-03-12 MED FILL — DULoxetine HCL 40 MG CPEP: 40 | 30 days supply | Qty: 30 | Fill #0

## 2020-03-12 MED FILL — LOSARTAN POTASSIUM 50 MG TA: 50 | 30 days supply | Qty: 30 | Fill #0

## 2020-03-12 NOTE — Progress Notes (Signed)
[facility] Patient [facility] [address] [place], [place]  [address] Phone:  [phone]   Fax:  [phone]  Virtual Visit via Telephone Note  I connected with Jill Shaw on 03/13/20 at 11:00 AM EDT by telephone and verified that I am speaking with the correct person using two identifiers.   I discussed the limitations, risks, security and privacy concerns of performing an evaluation and management service by telephone and the availability of in person appointments. I also discussed with the patient that there may be a patient responsible charge related to this service. The patient expressed understanding and agreed to proceed.   History of Present Illness:  Hospital follow-up.  She  has a past medical history of Anemia, Arthritis, Asthma, COPD (chronic obstructive pulmonary disease) (HCC), Diabetes mellitus without complication (HCC), Dysfunctional uterine bleeding, GERD (gastroesophageal reflux disease), Hypertension, Neuromuscular disorder (HCC), Seizures (HCC) (09/12/2017), Sickle cell trait (HCC), Smoker, Vitamin D deficiency (10/2019), and Wears glasses.  She was admitted recently from 5/23-5/26.  She has had multiple hospital admissions and ED visits this year for various reasons. She had a scheduled follow-up on 03/10/2020 and was being discharged from the hospital.  This admission was intractable abdominal pain with nausea and vomiting.  She will follow-up with gastroenterology.  She is currently being followed by gynecology for abnormal uterine bleeding.    She has a previous history of numbness and tingling.  She has had previous unilateral EMG.  She would like a referral to neurology for evaluation.  She does have uncontrolled diabetes.  She is aware of the complications associated with uncontrolled diabetes.  She does not monitor her blood glucose regularly.  Blood glucose consistently elevated while in the hospital.  She feels like this was related to her not receiving  her fourth dose of Humalog.  She admits this was adjusted because of her low daily caloric intake.  She expressed to them on several occasions that her blood sugar will remain elevated until she gets her full amount of insulin.  She has a follow-up appointment today with pulmonology for her COPD.  She does continue to smoke.  She does not have a desire to quit at this time.  She admits that with cardiology, she has some pending procedures for further evaluation of heart failure and hypertension.  She admits that possible changes in her current regimen may follow-up.    She admits that she does feel better overall.  Her weight is down 17 pounds in the last month.  She feels like this may be related to her recent hospitalization and dietary restrictions.  When asked patient about her current mood.  She admits that she has increased depression because of the multiple hospitalizations.  She is currently on Cymbalta 20 mg daily.  Observations/Objective: Patient able to talk in full sentences without shortness of breath.  Assessment and Plan: Assessment  Primary Diagnosis & Pertinent Problem List: The primary encounter diagnosis was Uncontrolled type 2 diabetes mellitus with chronic kidney disease (HCC). Diagnoses of Leg pain, bilateral, Current moderate episode of major depressive disorder without prior episode (HCC), Essential hypertension, Dermatitis, Numbness and tingling of both lower extremities, Chronic obstructive pulmonary disease, unspecified COPD type (HCC), and Morbid obesity (HCC) were also pertinent to this visit.  Visit Diagnosis: 1. Uncontrolled type 2 diabetes mellitus with chronic kidney disease (HCC)   2. Leg pain, bilateral   3. Current moderate episode of major depressive disorder without prior episode (HCC)   4. Essential  hypertension   5. Dermatitis   6. Numbness and tingling of both lower extremities   7. Chronic obstructive pulmonary disease, unspecified COPD type ([place])    8. Morbid obesity ([place])     Follow-up: Return in about 4 weeks (around 04/09/2020).  Plan of Care  Pharmacotherapy (Medications Ordered): Meds ordered this encounter  Medications  . losartan (COZAAR) 50 MG tablet    Sig: Take 1 tablet (50 mg total) by mouth daily.    Dispense:  30 tablet    Refill:  2    Order Specific Question:   Supervising Provider    Answer:   Tresa Garter [id number]  . hydrocortisone 2.5 % cream    Sig: Apply topically 2 (two) times daily.    Dispense:  30 g    Refill:  2    Order Specific Question:   Supervising Provider    Answer:   Tresa Garter [id number]  . DULoxetine 40 MG CPEP    Sig: Take 40 mg by mouth daily.    Dispense:  30 capsule    Refill:  2    Order Specific Question:   Supervising Provider    Answer:   Tresa Garter [[id number]]   New Prescriptions   DULOXETINE 40 MG CPEP    Take 40 mg by mouth daily.   Medications administered today: [name] had no medications administered during this visit. Lab-work, procedure(s), and/or referral(s): Orders Placed This Encounter  Procedures  . Ambulatory referral to Neurology    Patient instructions provided during this appointment: There are no Patient Instructions on file for this visit.  I discussed the assessment and treatment plan with the patient. The patient was provided an opportunity to ask questions and all were answered. The patient agreed with the plan and demonstrated an understanding of the instructions.   The patient was advised to call back or seek an in-person evaluation if the symptoms worsen or if the condition fails to improve as anticipated.  I provided 14 minutes of non-face-to-face time during this encounter.   Vevelyn Francois, NP

## 2020-03-13 LAB — CULTURE, BLOOD (ROUTINE X 2)
Culture: NO GROWTH
Culture: NO GROWTH
Special Requests: ADEQUATE
Special Requests: ADEQUATE

## 2020-03-16 MED FILL — OMEPRAZOLE DR 40 MG CAPSULE: 40 | 30 days supply | Qty: 30 | Fill #2

## 2020-03-17 ENCOUNTER — Ambulatory Visit: Payer: Self-pay | Admitting: Nurse Practitioner

## 2020-03-17 ENCOUNTER — Encounter: Payer: Self-pay | Admitting: Neurology

## 2020-03-17 ENCOUNTER — Ambulatory Visit: Payer: Self-pay | Admitting: Podiatry

## 2020-03-18 ENCOUNTER — Other Ambulatory Visit: Payer: Self-pay | Admitting: Nurse Practitioner

## 2020-03-18 NOTE — Telephone Encounter (Signed)
Is this okay to refill? 

## 2020-03-19 ENCOUNTER — Ambulatory Visit (INDEPENDENT_AMBULATORY_CARE_PROVIDER_SITE_OTHER): Payer: Self-pay | Admitting: Nurse Practitioner

## 2020-03-19 ENCOUNTER — Telehealth: Payer: Self-pay | Admitting: Nurse Practitioner

## 2020-03-19 ENCOUNTER — Encounter: Payer: Self-pay | Admitting: Nurse Practitioner

## 2020-03-19 ENCOUNTER — Other Ambulatory Visit: Payer: Self-pay

## 2020-03-19 VITALS — BP 165/70 | Temp 99.9°F | Wt 260.0 lb

## 2020-03-19 DIAGNOSIS — R110 Nausea: Secondary | ICD-10-CM

## 2020-03-19 DIAGNOSIS — B999 Unspecified infectious disease: Secondary | ICD-10-CM

## 2020-03-19 DIAGNOSIS — IMO0002 Reserved for concepts with insufficient information to code with codable children: Secondary | ICD-10-CM

## 2020-03-19 DIAGNOSIS — E1165 Type 2 diabetes mellitus with hyperglycemia: Secondary | ICD-10-CM

## 2020-03-19 DIAGNOSIS — E1122 Type 2 diabetes mellitus with diabetic chronic kidney disease: Secondary | ICD-10-CM

## 2020-03-19 DIAGNOSIS — R5381 Other malaise: Secondary | ICD-10-CM

## 2020-03-19 MED ORDER — DOXYCYCLINE HYCLATE 100 MG PO CAPS: 100.0000 mg | ORAL_CAPSULE | Freq: Two times a day (BID) | ORAL | 0 refills | Status: AC

## 2020-03-19 MED ORDER — TIZANIDINE HCL 6 MG PO CAPS: 6.0000 mg | ORAL_CAPSULE | Freq: Three times a day (TID) | ORAL | 0 refills | Status: DC

## 2020-03-19 MED ORDER — ONDANSETRON 8 MG PO TBDP: 8.0000 mg | ORAL_TABLET | Freq: Two times a day (BID) | ORAL | 0 refills | Status: AC

## 2020-03-19 MED ORDER — SAXAGLIPTIN HCL 5 MG PO TABS: 5.0000 mg | ORAL_TABLET | Freq: Every day | ORAL | 0 refills | Status: DC

## 2020-03-19 MED FILL — DOXYCYCLINE HYCLATE 100 MG: 100 | 7 days supply | Qty: 14 | Fill #0

## 2020-03-19 MED FILL — tiZANidine HCL 6 MG CAPS: 6 | 30 days supply | Qty: 90 | Fill #0

## 2020-03-19 NOTE — Telephone Encounter (Signed)
Sent to dee  to make an appt.

## 2020-03-19 NOTE — Progress Notes (Signed)
[facility] Patient [facility] [address] [place], [place]  [address] Phone:  [phone]   Fax:  [phone] Virtual Visit via Telephone Note  I connected with Jill Shaw on 03/20/20 at  2:40 PM EDT by telephone and verified that I am speaking with the correct person using two identifiers.   I discussed the limitations, risks, security and privacy concerns of performing an evaluation and management service by telephone and the availability of in person appointments. I also discussed with the patient that there may be a patient responsible charge related to this service. The patient expressed understanding and agreed to proceed.   History of Present Illness: Nausea / Vomiting Patient complains of nausea and vomiting. Onset of symptoms was several days ago. Patient describes nausea as moderate. Vomiting has not occurred over the past a few day Symptoms have been associated with mild abdominal pain and weakness and loqw grade temp 99.9. Patient denies alcohol overuse, hematemesis, melena and possibility of pregnancy. Symptoms have progressed to a point and plateaued. Evaluation to date has been seen in ER: 03/08/20 with hospital admission for 2 days. , see lab results and KUB: normal. Treatment to date has been promethazine and hydration on "armour" drinks . She admits that she did not want to go to the ER again because her arms are still sore from her previous admission. She is feeling better but is concern that she still has some infection in her body and the anbx in the ER is not enough to clear it all. Denies headache, dizziness, visual changes, shortness of breath, dyspnea on exertion, chest pain, or any edema.  She admits that her blood glucose was 450 without eating food.   Observations/Objective: Patient able to speak in full sentences without any change in speech, SOB or dry heaves .  Assessment and Plan: Assessment  Primary Diagnosis & Pertinent Problem List: The primary  encounter diagnosis was Nausea. Diagnoses of Malaise, Infection, and Uncontrolled type 2 diabetes mellitus with chronic kidney disease (HCC) were also pertinent to this visit.  Visit Diagnosis: 1. Nausea   2. Malaise   3. Infection   4. Uncontrolled type 2 diabetes mellitus with chronic kidney disease (HCC)    Follow Up Instructions:    Plan of Care  Pharmacotherapy (Medications Ordered): Meds ordered this encounter  Medications   doxycycline (VIBRAMYCIN) 100 MG capsule    Sig: Take 1 capsule (100 mg total) by mouth 2 (two) times daily for 7 days.    Dispense:  14 capsule    Refill:  0    Order Specific Question:   Supervising Provider    Answer:   Quentin Angst [[id number]]   ondansetron (ZOFRAN-ODT) 8 MG disintegrating tablet    Sig: Take 1 tablet (8 mg total) by mouth 2 (two) times daily for 15 days.    Dispense:  30 tablet    Refill:  0    Do not add to the electronic "Automatic Refill" notification system. Patient may have prescription filled one day early if pharmacy is closed on scheduled refill date.    Order Specific Question:   Supervising Provider    Answer:   Quentin Angst [[id number]]   tizanidine (ZANAFLEX) 6 MG capsule    Sig: Take 1 capsule (6 mg total) by mouth 3 (three) times daily.    Dispense:  90 capsule    Refill:  0    Order Specific Question:   Supervising Provider    Answer:  JEGEDE, OLUGBEMIGA E [[id number]]   saxagliptin HCl (ONGLYZA) 5 MG TABS tablet    Sig: Take 1 tablet (5 mg total) by mouth daily.    Dispense:  90 tablet    Refill:  0    Order Specific Question:   Supervising Provider    Answer:   Tresa Garter [[id number]]   New Prescriptions   DOXYCYCLINE (VIBRAMYCIN) 100 MG CAPSULE    Take 1 capsule (100 mg total) by mouth 2 (two) times daily for 7 days.   ONDANSETRON (ZOFRAN-ODT) 8 MG DISINTEGRATING TABLET    Take 1 tablet (8 mg total) by mouth 2 (two) times daily for 15 days.   SAXAGLIPTIN HCL (ONGLYZA) 5 MG TABS TABLET     Take 1 tablet (5 mg total) by mouth daily.    Provider-requested follow-up: Return for Appointment As Scheduled.  Future Appointments  Date Time Provider [place]  03/29/2020 11:30 AM [facility] 4 [facility]  03/31/2020  9:35 AM Aletha Halim, MD [facility]  04/09/2020  2:00 PM Vevelyn Francois, NP [place] None  04/21/2020  2:45 PM Edrick Kins, DPM [facility]  05/26/2020  2:40 PM Vevelyn Francois, NP [place] None  06/28/2020 12:50 PM Alda Berthold, DO [facility] None     Patient instructions provided during this appointment: There are no Patient Instructions on file for this visit.  I discussed the assessment and treatment plan with the patient. The patient was provided an opportunity to ask questions and all were answered. The patient agreed with the plan and demonstrated an understanding of the instructions.   The patient was advised to call back or seek an in-person evaluation if the symptoms worsen or if the condition fails to improve as anticipated.  I provided 18 minutes of non-face-to-face time during this encounter.   Vevelyn Francois, NP

## 2020-03-19 NOTE — Telephone Encounter (Signed)
Call and left a message for Pt to call the office back to schedule a Tele Visit with C.King

## 2020-03-22 ENCOUNTER — Ambulatory Visit: Payer: Self-pay | Admitting: Nurse Practitioner

## 2020-03-22 NOTE — Telephone Encounter (Signed)
Done

## 2020-03-29 ENCOUNTER — Encounter (HOSPITAL_COMMUNITY): Payer: Self-pay | Admitting: *Deleted

## 2020-03-29 ENCOUNTER — Other Ambulatory Visit (HOSPITAL_COMMUNITY): Payer: Self-pay

## 2020-03-29 NOTE — Progress Notes (Signed)
Patient ID: Jill Shaw, female   DOB: Dec 16, 1975, 44 y.o.   MRN: [id number] Verified appointment No Show with Marcelino Duster at 11.45am

## 2020-03-31 ENCOUNTER — Telehealth: Payer: Self-pay | Admitting: Obstetrics and Gynecology

## 2020-03-31 ENCOUNTER — Ambulatory Visit: Payer: Self-pay | Admitting: Obstetrics and Gynecology

## 2020-03-31 NOTE — Telephone Encounter (Signed)
I returned pt's call and left VM message stating that if she has questions or would like to discuss any concerns with a nurse before her rescheduled appt with the doctor, she may leave a new message or send a MyChart message.

## 2020-03-31 NOTE — Telephone Encounter (Signed)
patient is requesting a call back, state she canceled her appointment today due to not feeling well, also Depo is not working for her.

## 2020-04-05 ENCOUNTER — Telehealth: Payer: Self-pay | Admitting: Obstetrics and Gynecology

## 2020-04-05 ENCOUNTER — Encounter (HOSPITAL_COMMUNITY): Payer: Self-pay | Admitting: Cardiovascular Disease

## 2020-04-05 NOTE — Telephone Encounter (Signed)
Patient called in wanting to know when her appointment was. Appointment information was given to patient. Patient wanted to know if there was anything sooner because she received the depo last month and is still bleeding. Patient instructed that a message will be sent to the nurses and they will contact her as soon as they can. Patient verbalized understanding and message sent to clinical pool.

## 2020-04-06 MED FILL — HYDROCORTISONE 2.5% CREAM: 2.5 | 30 days supply | Qty: 30 | Fill #1

## 2020-04-07 NOTE — Telephone Encounter (Signed)
Returned patients call in regards to her concerns with bleeding after starting Depo. Patient did not answer. LM for patient to call the office at her convenience.

## 2020-04-08 NOTE — Telephone Encounter (Signed)
Let me know if you hear anything back from her. She had an appt with me on 6/16 that it looks like it was cancelled

## 2020-04-08 NOTE — Telephone Encounter (Signed)
Called patient back, she did not answer. LM for her to call the office at her convenience is she still has questions or concerns.

## 2020-04-09 ENCOUNTER — Other Ambulatory Visit: Payer: Self-pay

## 2020-04-09 ENCOUNTER — Ambulatory Visit (INDEPENDENT_AMBULATORY_CARE_PROVIDER_SITE_OTHER): Payer: Self-pay | Admitting: Primary Care

## 2020-04-09 ENCOUNTER — Ambulatory Visit: Payer: Self-pay | Admitting: Nurse Practitioner

## 2020-04-09 ENCOUNTER — Telehealth: Payer: Self-pay | Admitting: General Practice

## 2020-04-09 ENCOUNTER — Encounter: Payer: Self-pay | Admitting: Primary Care

## 2020-04-09 DIAGNOSIS — I5032 Chronic diastolic (congestive) heart failure: Secondary | ICD-10-CM

## 2020-04-09 DIAGNOSIS — J449 Chronic obstructive pulmonary disease, unspecified: Secondary | ICD-10-CM

## 2020-04-09 MED ORDER — SPIRIVA RESPIMAT 2.5 MCG/ACT IN AERS
2.0000 | INHALATION_SPRAY | Freq: Every day | RESPIRATORY_TRACT | 0 refills | Status: DC
Start: 2020-04-09 — End: 2020-09-27

## 2020-04-09 NOTE — Telephone Encounter (Signed)
Patient called and left message on nurse voicemail line stating she has been bleeding since her ablation. She reports a 6/4 period and has been bleeding since then. She wants something sent in to make her bleeding stop.   Called patient, no answer- left message stating we are trying to reach you to return your phone call. We will forward your message on to Dr [name]. You may call us back if you have questions.

## 2020-04-09 NOTE — Patient Instructions (Addendum)
Recommendations: - Continue Symbicort 2 puffs twice daily - Adding Spiriva respimat (sample given) - Strongly encourage to taper down the amount you are smoking and when read set a quit date - Continue to work on weight loss effort, goal is 240lb  RX: - Spiriva respimat 2 puffs once daily in the morning   Orders: - Please schedule split-night sleep study-was ordered in May by Dr. Gwenlyn Found with cardiology - Please schedule echocardiogram-this was ordered in May by Dr. Gwenlyn Found with cardiology  Follow-up: - 3 months with Dr. Valeta Harms OR APP   COPD and Physical Activity Chronic obstructive pulmonary disease (COPD) is a long-term (chronic) condition that affects the lungs. COPD is a general term that can be used to describe many different lung problems that cause lung swelling (inflammation) and limit airflow, including chronic bronchitis and emphysema. The main symptom of COPD is shortness of breath, which makes it harder to do even simple tasks. This can also make it harder to exercise and be active. Talk with your health care provider about treatments to help you breathe better and actions you can take to prevent breathing problems during physical activity. What are the benefits of exercising with COPD? Exercising regularly is an important part of a healthy lifestyle. You can still exercise and do physical activities even though you have COPD. Exercise and physical activity improve your shortness of breath by increasing blood flow (circulation). This causes your heart to pump more oxygen through your body. Moderate exercise can improve your:  Oxygen use.  Energy level.  Shortness of breath.  Strength in your breathing muscles.  Heart health.  Sleep.  Self-esteem and feelings of self-worth.  Depression, stress, and anxiety levels. Exercise can benefit everyone with COPD. The severity of your disease may affect how hard you can exercise, especially at first, but everyone can benefit. Talk  with your health care provider about how much exercise is safe for you, and which activities and exercises are safe for you. What actions can I take to prevent breathing problems during physical activity?  Sign up for a pulmonary rehabilitation program. This type of program may include: ? Education about lung diseases. ? Exercise classes that teach you how to exercise and be more active while improving your breathing. This usually involves:  Exercise using your lower extremities, such as a stationary bicycle.  About 30 minutes of exercise, 2 to 5 times per week, for 6 to 12 weeks  Strength training, such as push ups or leg lifts. ? Nutrition education. ? Group classes in which you can talk with others who also have COPD and learn ways to manage stress.  If you use an oxygen tank, you should use it while you exercise. Work with your health care provider to adjust your oxygen for your physical activity. Your resting flow rate is different from your flow rate during physical activity.  While you are exercising: ? Take slow breaths. ? Pace yourself and do not try to go too fast. ? Purse your lips while breathing out. Pursing your lips is similar to a kissing or whistling position. ? If doing exercise that uses a quick burst of effort, such as weight lifting:  Breathe in before starting the exercise.  Breathe out during the hardest part of the exercise (such as raising the weights). Where to find support You can find support for exercising with COPD from:  Your health care provider.  A pulmonary rehabilitation program.  Your local health department or community health programs.  Support groups, online or in-person. Your health care provider may be able to recommend support groups. Where to find more information You can find more information about exercising with COPD from:  American Lung Association: [place].  COPD Foundation: AlmostHot.gl. Contact a health care provider  if:  Your symptoms get worse.  You have chest pain.  You have nausea.  You have a fever.  You have trouble talking or catching your breath.  You want to start a new exercise program or a new activity. Summary  COPD is a general term that can be used to describe many different lung problems that cause lung swelling (inflammation) and limit airflow. This includes chronic bronchitis and emphysema.  Exercise and physical activity improve your shortness of breath by increasing blood flow (circulation). This causes your heart to provide more oxygen to your body.  Contact your health care provider before starting any exercise program or new activity. Ask your health care provider what exercises and activities are safe for you. This information is not intended to replace advice given to you by your health care provider. Make sure you discuss any questions you have with your health care provider. Document Revised: 01/22/2019 Document Reviewed: 10/25/2017 Elsevier Patient Education  2020 [employer].   Steps to Quit Smoking Smoking tobacco is the leading cause of preventable death. It can affect almost every organ in the body. Smoking puts you and people around you at risk for many serious, long-lasting (chronic) diseases. Quitting smoking can be hard, but it is one of the best things that you can do for your health. It is never too late to quit. How do I get ready to quit? When you decide to quit smoking, make a plan to help you succeed. Before you quit:  Pick a date to quit. Set a date within the next 2 weeks to give you time to prepare.  Write down the reasons why you are quitting. Keep this list in places where you will see it often.  Tell your family, friends, and co-workers that you are quitting. Their support is important.  Talk with your doctor about the choices that may help you quit.  Find out if your health insurance will pay for these treatments.  Know the people, places,  things, and activities that make you want to smoke (triggers). Avoid them. What first steps can I take to quit smoking?  Throw away all cigarettes at home, at work, and in your car.  Throw away the things that you use when you smoke, such as ashtrays and lighters.  Clean your car. Make sure to empty the ashtray.  Clean your home, including curtains and carpets. What can I do to help me quit smoking? Talk with your doctor about taking medicines and seeing a counselor at the same time. You are more likely to succeed when you do both.  If you are pregnant or breastfeeding, talk with your doctor about counseling or other ways to quit smoking. Do not take medicine to help you quit smoking unless your doctor tells you to do so. To quit smoking: Quit right away  Quit smoking totally, instead of slowly cutting back on how much you smoke over a period of time.  Go to counseling. You are more likely to quit if you go to counseling sessions regularly. Take medicine You may take medicines to help you quit. Some medicines need a prescription, and some you can buy over-the-counter. Some medicines may contain a drug called nicotine to replace  the nicotine in cigarettes. Medicines may:  Help you to stop having the desire to smoke (cravings).  Help to stop the problems that come when you stop smoking (withdrawal symptoms). Your doctor may ask you to use:  Nicotine patches, gum, or lozenges.  Nicotine inhalers or sprays.  Non-nicotine medicine that is taken by mouth. Find resources Find resources and other ways to help you quit smoking and remain smoke-free after you quit. These resources are most helpful when you use them often. They include:  Online chats with a Veterinary surgeon.  Phone quitlines.  Printed Materials engineer.  Support groups or group counseling.  Text messaging programs.  Mobile phone apps. Use apps on your mobile phone or tablet that can help you stick to your quit plan.  There are many free apps for mobile phones and tablets as well as websites. Examples include Quit Guide from the [employer] and smokefree.gov  What things can I do to make it easier to quit?   Talk to your family and friends. Ask them to support and encourage you.  Call a phone quitline (1-800-QUIT-NOW), reach out to support groups, or work with a Veterinary surgeon.  Ask people who smoke to not smoke around you.  Avoid places that make you want to smoke, such as: ? Bars. ? Parties. ? Smoke-break areas at work.  Spend time with people who do not smoke.  Lower the stress in your life. Stress can make you want to smoke. Try these things to help your stress: ? Getting regular exercise. ? Doing deep-breathing exercises. ? Doing yoga. ? Meditating. ? Doing a body scan. To do this, close your eyes, focus on one area of your body at a time from head to toe. Notice which parts of your body are tense. Try to relax the muscles in those areas. How will I feel when I quit smoking? Day 1 to 3 weeks Within the first 24 hours, you may start to have some problems that come from quitting tobacco. These problems are very bad 2-3 days after you quit, but they do not often last for more than 2-3 weeks. You may get these symptoms:  Mood swings.  Feeling restless, nervous, angry, or annoyed.  Trouble concentrating.  Dizziness.  Strong desire for high-sugar foods and nicotine.  Weight gain.  Trouble pooping (constipation).  Feeling like you may vomit (nausea).  Coughing or a sore throat.  Changes in how the medicines that you take for other issues work in your body.  Depression.  Trouble sleeping (insomnia). Week 3 and afterward After the first 2-3 weeks of quitting, you may start to notice more positive results, such as:  Better sense of smell and taste.  Less coughing and sore throat.  Slower heart rate.  Lower blood pressure.  Clearer skin.  Better breathing.  Fewer sick days. Quitting  smoking can be hard. Do not give up if you fail the first time. Some people need to try a few times before they succeed. Do your best to stick to your quit plan, and talk with your doctor if you have any questions or concerns. Summary  Smoking tobacco is the leading cause of preventable death. Quitting smoking can be hard, but it is one of the best things that you can do for your health.  When you decide to quit smoking, make a plan to help you succeed.  Quit smoking right away, not slowly over a period of time.  When you start quitting, seek help from your doctor,  family, or friends. This information is not intended to replace advice given to you by your health care provider. Make sure you discuss any questions you have with your health care provider. Document Revised: 06/27/2019 Document Reviewed: 12/21/2018 Elsevier Patient Education  [place].

## 2020-04-09 NOTE — Progress Notes (Signed)
$'@Patient'H$  ID: Jill Shaw, female    DOB: [birth year], 44 y.o.   MRN: [id number]  Chief Complaint  Patient presents with  . Follow-up    DOE, dry cough    Referring provider: Vevelyn Francois, NP  HPI: 44 year old female, current everyday smoker.  Past medical history significant for chronic obstructive pulmonary disease, chronic diastolic heart failure, hypertension, IBS, GERD, type 2 diabetes.  Patient of Dr. Valeta Harms, seen for initial consult on 05/27/2019 for dyspnea on exertion.  Dyspnea felt to be multifactorial, specifically due to obesity and physical deconditioning.  Suspect probable sleep disordered breathing.  Is ordered for pulmonary function testing due to past smoking history.  Encourage smoking cessation.  04/09/2020 Patient presents today for regular follow-up.  Reports experiencing shortness of breath daily at rest and with minimal activity. Associated chest tightness and wheezing.  PFTs in December 2020 showed moderate obstruction and evidence of air trapping. She is currently on Symbicort. She uses albuterol 3-4 times a day. She is still smoking 1/2 ppd. She is willing to have these scheduled. She has lost 8 lbs since her last visit in August 2020. Discussed weight goal of 240lbs today. She saw cardiology in May and was ordered for split-night sleep study and echocardiogram which had not been completed.   TESTING: 04/16/17 echocardiogram-  - LVEF 55-60%, mild LVH, normla wall motion, mildly reduced GLPSS  at -15%, normal LA size, normal IVC.   Allergies  Allergen Reactions  . Ketoprofen Nausea And Vomiting  . Aspirin Nausea Only  . Gabapentin Nausea And Vomiting    upset stomach  . Ibuprofen Nausea And Vomiting  . Liraglutide Nausea And Vomiting  . Naproxen Nausea And Vomiting  . Omeprazole-Sodium Bicarbonate Nausea And Vomiting  . Sulfa Antibiotics Nausea And Vomiting  . Tramadol Nausea And Vomiting    stomach upset    Immunization History  Administered Date(s)  Administered  . Influenza,inj,Quad PF,6+ Mos 09/03/2018, 07/03/2019  . Pneumococcal Polysaccharide-23 03/28/2015  . Tdap 03/27/2016    Past Medical History:  Diagnosis Date  . Anemia   . Arthritis    knees, hands  . Asthma   . COPD (chronic obstructive pulmonary disease) ([place])   . Diabetes mellitus without complication ([place])    type 2  . Dysfunctional uterine bleeding   . GERD (gastroesophageal reflux disease)   . Hypertension   . Neuromuscular disorder ([facility])    neuropathy feet  . Seizures ([place]) 09/12/2017   pt states r/t stress and blood sugar - no meds last one 4 months ago, not seen neurologist  . Sickle cell trait ([place])   . Smoker   . Vitamin D deficiency 10/2019  . Wears glasses     Tobacco History: Social History   Tobacco Use  Smoking Status Current Every Day Smoker  . Packs/day: 0.50  . Years: 26.00  . Pack years: 13.00  . Types: Cigarettes  Smokeless Tobacco Never Used   Ready to quit: Not Answered Counseling given: Not Answered   Outpatient Medications Prior to Visit  Medication Sig Dispense Refill  . albuterol (PROVENTIL) (2.5 MG/3ML) 0.083% nebulizer solution Take 3 mLs (2.5 mg total) by nebulization every 6 (six) hours as needed for wheezing or shortness of breath. 150 mL 3  . albuterol (VENTOLIN HFA) 108 (90 Base) MCG/ACT inhaler Inhale 2 puffs into the lungs every 6 (six) hours as needed for wheezing or shortness of breath. 8 g 3  . blood glucose meter kit and supplies KIT Dispense based  on patient and insurance preference. Use up to four times daily as directed. (FOR ICD-9 250.00, 250.01). (Patient taking differently: 1 each by Other route See admin instructions. Dispense based on patient and insurance preference. Use up to four times daily as directed. (FOR ICD-9 250.00, 250.01).) 1 each 0  . Blood Glucose Monitoring Suppl (TRUE METRIX METER) w/Device KIT 1 each by Does not apply route 4 (four) times daily -  before meals and at bedtime. 1 kit 0  .  Blood Pressure Monitoring (BLOOD PRESSURE KIT) DEVI Check BP BID prn (Patient taking differently: 1 each by Other route 2 (two) times daily as needed (blood pressure). ) 1 each 0  . budesonide-formoterol (SYMBICORT) 160-4.5 MCG/ACT inhaler INHALE 2 PUFFS INTO THE LUNGS 2 (TWO) TIMES DAILY. (Patient taking differently: Inhale 2 puffs into the lungs 2 (two) times daily. ) 10.2 g 3  . cetirizine (ZYRTEC) 10 MG tablet Take 1 tablet (10 mg total) by mouth daily. 30 tablet 3  . Cholecalciferol (VITAMIN D-3) 125 MCG (5000 UT) TABS Take 1 tablet by mouth daily. (Patient taking differently: Take 5,000 Units by mouth daily. ) 90 tablet 3  . DULoxetine 40 MG CPEP Take 40 mg by mouth daily. 30 capsule 2  . ferrous sulfate 325 (65 FE) MG tablet Take 1 tablet (325 mg total) by mouth 3 (three) times daily with meals. 30 tablet 3  . fluticasone (FLONASE) 50 MCG/ACT nasal spray Place 2 sprays into both nostrils daily. 16 g 6  . furosemide (LASIX) 40 MG tablet Take 1 tablet (40 mg total) by mouth daily. 30 tablet 0  . Glucosamine Sulfate 1000 MG CAPS Take 1 capsule (1,000 mg total) by mouth 2 (two) times daily. 180 capsule 3  . glucose blood (TRUE METRIX BLOOD GLUCOSE TEST) test strip Use as instructed (Patient taking differently: 1 each by Other route See admin instructions. Use as instructed) 100 each 12  . hydrocortisone 2.5 % cream Apply topically 2 (two) times daily. 30 g 2  . Insulin Lispro Prot & Lispro (HUMALOG MIX 75/25 KWIKPEN) (75-25) 100 UNIT/ML Kwikpen INJECT 100 UNITS EVERY 12 HOURS (Patient taking differently: Inject 100 Units into the skin every 12 (twelve) hours. ) 15 mL 11  . Insulin Pen Needle (PEN NEEDLES) 30G X 5 MM MISC 1 Units by Does not apply route as directed. 100 each 11  . losartan (COZAAR) 50 MG tablet Take 1 tablet (50 mg total) by mouth daily. 30 tablet 2  . metoprolol succinate (TOPROL-XL) 100 MG 24 hr tablet Take 1 tablet (100 mg total) by mouth daily. Take with or immediately following  a meal. 90 tablet 3  . omeprazole (PRILOSEC) 40 MG capsule Take 1 capsule (40 mg total) by mouth daily. 30 capsule 3  . potassium chloride SA (KLOR-CON) 20 MEQ tablet Take 1 tablet (20 mEq total) by mouth daily. 30 tablet 3  . promethazine (PHENERGAN) 12.5 MG tablet Take 1 tablet (12.5 mg total) by mouth every 6 (six) hours as needed for nausea or vomiting. 15 tablet 0  . saxagliptin HCl (ONGLYZA) 2.5 MG TABS tablet Take 1 tablet (2.5 mg total) by mouth daily. 90 tablet 3  . saxagliptin HCl (ONGLYZA) 5 MG TABS tablet Take 1 tablet (5 mg total) by mouth daily. 90 tablet 0  . tizanidine (ZANAFLEX) 6 MG capsule Take 1 capsule (6 mg total) by mouth 3 (three) times daily. 90 capsule 0  . TRUEplus Lancets 28G MISC 1 each by Other route in the  morning, at noon, in the evening, and at bedtime.    . Turmeric 500 MG CAPS Take 500 mg by mouth 2 (two) times daily. 180 capsule 3  . Vitamin D, Ergocalciferol, (DRISDOL) 1.25 MG (50000 UNIT) CAPS capsule Take 1 capsule (50,000 Units total) by mouth every 7 (seven) days. (Patient taking differently: Take 50,000 Units by mouth every Friday. ) 5 capsule 6  . Lido-Capsaicin-Men-Methyl Sal 0.5-0.035-5-20 % PTCH Apply 1 patch topically daily. (Patient taking differently: Apply 1 patch topically daily as needed (pain). ) 30 patch 2   No facility-administered medications prior to visit.   Review of Systems  Review of Systems  Respiratory: Positive for chest tightness, shortness of breath and wheezing. Negative for cough.   Psychiatric/Behavioral: Positive for sleep disturbance.   Physical Exam  BP 124/76 (BP Location: Left Arm, Cuff Size: Large)   Pulse (!) 102   Temp 97.8 F (36.6 C) (Oral)   Ht '5\' 4"'$  (1.626 m)   Wt 265 lb 6.4 oz (120.4 kg)   SpO2 99%   BMI 45.56 kg/m  Physical Exam Constitutional:      Appearance: Normal appearance.  HENT:     Head: Normocephalic and atraumatic.     Mouth/Throat:     Mouth: Mucous membranes are moist.     Pharynx:  Oropharynx is clear.     Comments: Mallampati class  Cardiovascular:     Rate and Rhythm: Normal rate and regular rhythm.  Pulmonary:     Effort: Pulmonary effort is normal.     Breath sounds: Normal breath sounds.  Neurological:     General: No focal deficit present.     Mental Status: She is alert and oriented to person, place, and time. Mental status is at baseline.  Psychiatric:        Mood and Affect: Mood normal.        Behavior: Behavior normal.        Thought Content: Thought content normal.        Judgment: Judgment normal.      Lab Results:  CBC    Component Value Date/Time   WBC 9.7 03/10/2020 0432   RBC 3.51 (L) 03/10/2020 0432   HGB 8.5 (L) 03/10/2020 0432   HGB 10.9 (L) 02/12/2020 1554   HCT 28.5 (L) 03/10/2020 0432   HCT 35.6 02/12/2020 1554   PLT 217 03/10/2020 0432   PLT 359 02/12/2020 1554   MCV 81.2 03/10/2020 0432   MCV 80 02/12/2020 1554   MCH 24.2 (L) 03/10/2020 0432   MCHC 29.8 (L) 03/10/2020 0432   RDW 22.3 (H) 03/10/2020 0432   RDW 21.5 (H) 02/12/2020 1554   LYMPHSABS 2.4 03/10/2020 0432   LYMPHSABS 2.1 02/12/2020 1554   MONOABS 0.5 03/10/2020 0432   EOSABS 0.2 03/10/2020 0432   EOSABS 0.5 (H) 02/12/2020 1554   BASOSABS 0.1 03/10/2020 0432   BASOSABS 0.1 02/12/2020 1554    BMET    Component Value Date/Time   NA 136 03/10/2020 0432   NA 140 02/12/2020 1554   K 3.9 03/10/2020 0432   CL 105 03/10/2020 0432   CO2 23 03/10/2020 0432   GLUCOSE 295 (H) 03/10/2020 0432   BUN 17 03/10/2020 0432   BUN 11 02/12/2020 1554   CREATININE 1.25 (H) 03/10/2020 0432   CREATININE 1.18 (H) 08/31/2017 1621   CALCIUM 8.6 (L) 03/10/2020 0432   GFRNONAA 53 (L) 03/10/2020 0432   GFRNONAA 58 (L) 08/31/2017 1621   GFRAA >60 03/10/2020 1245  GFRAA 67 08/31/2017 1621    BNP    Component Value Date/Time   BNP 125.5 (H) 02/03/2020 0341   BNP 67.5 03/30/2017 1438    ProBNP No results found for: PROBNP  Imaging: No results found.   Assessment &  Plan:   Chronic obstructive pulmonary disease ([place]) - Patient reports dyspnea with activity with associated chest tightness/wheezing - PFTs in Dec 2020 showed moderate obstruction and evidence of air trapping - Continue Symbicort 160 two puffs twice daily - Adding Spiriva respimat 2.70mg (sample given) - Strongly encourage to taper down the amount you are smoking and when read set a quit date  Chronic diastolic CHF (congestive heart failure) ([facility] - Patient is to schedule echocardiogram and split-night sleep study which was ordered in May by Dr. BGwenlyn Foundwith cardiology   FU in 3 months   EMartyn Ehrich NP 04/12/2020

## 2020-04-10 DIAGNOSIS — M79676 Pain in unspecified toe(s): Secondary | ICD-10-CM

## 2020-04-12 ENCOUNTER — Telehealth: Payer: Self-pay | Admitting: Primary Care

## 2020-04-12 ENCOUNTER — Other Ambulatory Visit: Payer: Self-pay | Admitting: Obstetrics and Gynecology

## 2020-04-12 ENCOUNTER — Other Ambulatory Visit: Payer: Self-pay

## 2020-04-12 DIAGNOSIS — R109 Unspecified abdominal pain: Secondary | ICD-10-CM

## 2020-04-12 DIAGNOSIS — N926 Irregular menstruation, unspecified: Secondary | ICD-10-CM

## 2020-04-12 MED ORDER — ACETAMINOPHEN-CODEINE #3 300-30 MG PO TABS: 1.0000 | ORAL_TABLET | Freq: Three times a day (TID) | ORAL | 0 refills | Status: DC | PRN

## 2020-04-12 MED FILL — ACETAMINOPHEN/COD #3 TABLET: 300-30 | 2 days supply | Qty: 8 | Fill #0

## 2020-04-12 NOTE — Progress Notes (Signed)
MyChart conversation and pt hx reviewed with Donavan Foil, MD who gives verbal order for acetaminophen-codeine 300-30mg  every 8-12 hours. Order discontinued. Donavan Foil, MD to order to pt's preferred pharmacy instead of printed rx for cosign.

## 2020-04-12 NOTE — Assessment & Plan Note (Signed)
-   Patient is to schedule echocardiogram and split-night sleep study which was ordered in May by Dr. Allyson Sabal with cardiology

## 2020-04-12 NOTE — Telephone Encounter (Signed)
Patient reports she is on [facility] assistance at 100%. We should be getting a fax from them today or tomorrow. Please leave in triage for follow up.

## 2020-04-12 NOTE — Assessment & Plan Note (Signed)
-   Patient reports dyspnea with activity with associated chest tightness/wheezing - PFTs in Dec 2020 showed moderate obstruction and evidence of air trapping - Continue Symbicort 160 two puffs twice daily - Adding Spiriva respimat 2.59mcg (sample given) - Strongly encourage to taper down the amount you are smoking and when read set a quit date

## 2020-04-12 NOTE — Progress Notes (Signed)
Nursing staff stated that pt is still having irregular bleeding and abdominal pain.  She is requesting pain medication.  Pt has f/u visit on 05/03/2020.  Initially tried prescribing tramadol, but allergy flag noted.  She has allergies to multiple NSAIDS and multiple co-morbidities.  Will give small rx for tylenol 3.  No other refills will be given.

## 2020-04-12 NOTE — Assessment & Plan Note (Signed)
-   Continue weight loss effort, goal is 240lb - Patient can be referred to nutrition or healthy weight and wellness with [facility] if she needs help

## 2020-04-13 NOTE — Telephone Encounter (Signed)
Called and left message for patient to obtain more information regarding her request. Why are we getting the fax? Should it go to billing? Does she have a number for the assistance office? Waiting for call.

## 2020-04-14 NOTE — Telephone Encounter (Signed)
Checked Beth's look at, we do not have anything in regards to the pt's assistance through [facility].  Marisue Ivan - is there a way this can be verified?

## 2020-04-15 ENCOUNTER — Other Ambulatory Visit: Payer: Self-pay | Admitting: Nurse Practitioner

## 2020-04-15 DIAGNOSIS — E119 Type 2 diabetes mellitus without complications: Secondary | ICD-10-CM

## 2020-04-15 MED ORDER — TRUE METRIX BLOOD GLUCOSE TEST VI STRP: ORAL_STRIP | 12 refills | Status: DC

## 2020-04-15 MED FILL — TRUE METRIX TEST STRIP: 25 days supply | Qty: 100 | Fill #0

## 2020-04-16 ENCOUNTER — Other Ambulatory Visit: Payer: Self-pay | Admitting: Nurse Practitioner

## 2020-04-16 NOTE — Telephone Encounter (Signed)
Left message for patient that we were following up on this and would get back to her once we have more information.  Jill Shaw please advise.

## 2020-04-18 NOTE — Progress Notes (Signed)
Thanks for seeing her Josephine Igo, DO [facility] 04/18/2020 5:57 PM

## 2020-04-20 MED FILL — tiZANidine HCL 6 MG CAPS: 6 | 30 days supply | Qty: 90 | Fill #0

## 2020-04-20 NOTE — Telephone Encounter (Signed)
LMTCB x1 for pt. I am sure what the pt is needing from [place]. If she has financial assistance through [facility] it should be in her chart.

## 2020-04-20 NOTE — Telephone Encounter (Signed)
Pt called back from this.  Would like to know if any information has been found yet. Pt also left account number : [phone]  Please call pt with update.

## 2020-04-21 ENCOUNTER — Ambulatory Visit: Payer: No Typology Code available for payment source | Admitting: Podiatry

## 2020-04-21 ENCOUNTER — Telehealth (INDEPENDENT_AMBULATORY_CARE_PROVIDER_SITE_OTHER): Payer: Self-pay | Admitting: Lactation Services

## 2020-04-21 ENCOUNTER — Other Ambulatory Visit: Payer: Self-pay

## 2020-04-21 ENCOUNTER — Encounter (HOSPITAL_COMMUNITY): Payer: Self-pay

## 2020-04-21 DIAGNOSIS — M722 Plantar fascial fibromatosis: Secondary | ICD-10-CM

## 2020-04-21 DIAGNOSIS — N939 Abnormal uterine and vaginal bleeding, unspecified: Secondary | ICD-10-CM

## 2020-04-21 MED ORDER — METHYLPREDNISOLONE 4 MG PO TBPK: ORAL_TABLET | ORAL | 0 refills | Status: DC

## 2020-04-21 NOTE — Telephone Encounter (Signed)
Called patient to inform her that an appointment has come available on 7/9 at 10:15 with Dr. [name]. Advised patient to please keep the appointment so we can help her with her needs and concerns. Patient voiced she plans to come to the appointment.

## 2020-04-22 ENCOUNTER — Telehealth: Payer: Self-pay | Admitting: *Deleted

## 2020-04-22 ENCOUNTER — Ambulatory Visit: Payer: Self-pay | Admitting: Nurse Practitioner

## 2020-04-22 MED FILL — ?METHYLPREDNISOLONE 4MG TAB: 4 | 6 days supply | Qty: 21 | Fill #0

## 2020-04-22 NOTE — Telephone Encounter (Signed)
Spoke with the pt  I advised if she has additional questions about her coverage she should call her insurance company  She verbalized understanding  Nothing further needed

## 2020-04-22 NOTE — Telephone Encounter (Signed)
LMTCB x2 for pt. This information is now listed in her chart and there is a scanned document stating that she is covered at 100% until October 2021.

## 2020-04-22 NOTE — Telephone Encounter (Signed)
Pt request information concerning the procedures Dr. Logan Bores wants to do on her foot and what each is for, mailed to her.

## 2020-04-22 NOTE — Telephone Encounter (Signed)
Pt returning missed call. Informed pt that "This information is now listed in her chart and there is a scanned document stating that she is covered at 100% until October 2021. " per Lillia Abed. Pt wanted to know if her last visit was also covered/ Gives verbal consent to leave detailed voicemail. [phone] (H)

## 2020-04-23 ENCOUNTER — Ambulatory Visit (INDEPENDENT_AMBULATORY_CARE_PROVIDER_SITE_OTHER): Payer: Self-pay | Admitting: Obstetrics and Gynecology

## 2020-04-23 ENCOUNTER — Encounter: Payer: Self-pay | Admitting: Obstetrics and Gynecology

## 2020-04-23 ENCOUNTER — Other Ambulatory Visit: Payer: Self-pay

## 2020-04-23 VITALS — BP 117/60 | HR 87 | Ht 65.0 in | Wt 269.9 lb

## 2020-04-23 DIAGNOSIS — F419 Anxiety disorder, unspecified: Secondary | ICD-10-CM

## 2020-04-23 DIAGNOSIS — N939 Abnormal uterine and vaginal bleeding, unspecified: Secondary | ICD-10-CM

## 2020-04-23 DIAGNOSIS — F3289 Other specified depressive episodes: Secondary | ICD-10-CM

## 2020-04-23 DIAGNOSIS — R232 Flushing: Secondary | ICD-10-CM

## 2020-04-23 MED ORDER — TRANEXAMIC ACID 650 MG PO TABS: 1300.0000 mg | ORAL_TABLET | Freq: Three times a day (TID) | ORAL | 0 refills | Status: DC

## 2020-04-23 MED FILL — TRANEXAMIC ACID 650 MG TAB: 650 | 5 days supply | Qty: 30 | Fill #0

## 2020-04-23 NOTE — Progress Notes (Signed)
   Subjective: 44 y.o. female presenting today for evaluation of right heel pain.  Patient was last seen in the office on 09/29/2019 for continued right foot pain.  She has been wearing OTC insoles which do not provide significant relief.  She is allergic to NSAIDs and has been taking OTC Tylenol as needed.  She states that the injection she received last visit did not help.  She presents for further treatment and evaluation   Past Medical History:  Diagnosis Date  . Anemia   . Arthritis    knees, hands  . Asthma   . COPD (chronic obstructive pulmonary disease) ([facility])   . Diabetes mellitus without complication ([facility])    type 2  . Dysfunctional uterine bleeding   . GERD (gastroesophageal reflux disease)   . Hypertension   . Neuromuscular disorder ([facility])    neuropathy feet  . Seizures ([facility]) 09/12/2017   pt states r/t stress and blood sugar - no meds last one 4 months ago, not seen neurologist  . Sickle cell trait ([facility])   . Smoker   . Vitamin D deficiency 10/2019  . Wears glasses      Objective: Physical Exam General: The patient is alert and oriented x3 in no acute distress.  Dermatology: Skin is warm, dry and supple bilateral lower extremities. Negative for open lesions or macerations bilateral.   Vascular: Dorsalis Pedis and Posterior Tibial pulses palpable bilateral.  Capillary fill time is immediate to all digits.  Neurological: Epicritic and protective threshold intact bilateral.   Musculoskeletal: Tenderness to palpation to the plantar aspect of the right heel along the plantar fascia. All other joints range of motion within normal limits bilateral. Strength 5/5 in all groups bilateral.   Assessment: 1. Plantar fasciitis right  Plan of Care:  1. Patient evaluated.    2.  Patient declined injections today since she has had minimal relief with previous injections  3. Rx for Medrol Dose Pack placed 4.  Patient cannot take NSAIDs since she is allergic.  She did not take  the prescribed meloxicam previously 5.  Immobilization cam boot dispensed today.  Weightbearing as tolerated x4 weeks.  After wearing the cam boot she may continue OTC insoles  6. Instructed patient regarding therapies and modalities at home to alleviate symptoms.  7.  Today we discussed surgical options to alleviate her heel pain, namely EPF of the right foot.  The patient is very hesitant present to proceed with surgery  8.  Return to clinic in 4 weeks.     Felecia Shelling, DPM [facility]  Dr. Felecia Shelling, DPM    [address] [place], [place] [address]                Office [phone]  Fax [phone]

## 2020-04-23 NOTE — Progress Notes (Signed)
C/o still bleeding after having ablation last year and has had one depo-provera shot. Has been bleeding over a month with clots. + PHQ9 and GAD 7 . Offered referral to [facility] which she accepted. Explained she will make appt at check out today.  Adriyanna Christians,RN

## 2020-04-24 LAB — CBC
Hematocrit: 31.1 % — ABNORMAL LOW (ref 34.0–46.6)
Hemoglobin: 9.5 g/dL — ABNORMAL LOW (ref 11.1–15.9)
MCH: 22.6 pg — ABNORMAL LOW (ref 26.6–33.0)
MCHC: 30.5 g/dL — ABNORMAL LOW (ref 31.5–35.7)
MCV: 74 fL — ABNORMAL LOW (ref 79–97)
Platelets: 259 10*3/uL (ref 150–450)
RBC: 4.21 x10E6/uL (ref 3.77–5.28)
RDW: 21.7 % — ABNORMAL HIGH (ref 11.7–15.4)
WBC: 11.2 10*3/uL — ABNORMAL HIGH (ref 3.4–10.8)

## 2020-04-24 LAB — FOLLICLE STIMULATING HORMONE: FSH: 5.3 m[IU]/mL

## 2020-04-25 ENCOUNTER — Encounter: Payer: Self-pay | Admitting: Obstetrics and Gynecology

## 2020-04-25 NOTE — Progress Notes (Addendum)
Obstetrics and Gynecology Established Patient Evaluation  Appointment Date: 04/23/2020  OBGYN Clinic: Center for [facility]  Primary Care Provider: Vevelyn Francois  Chief Complaint: continued AUB  History of Present Illness: Jill Shaw is a 44 y.o. African-American P3 (Patient's last menstrual period was 03/19/2020.), seen for the above chief complaint. Her past medical history is significant for BMI 40s, CHF, HTN, h/o seizures, CKD, DM2, tobacco abuse, [place] endometrial ablation 08/2019  Patient has been followed in this clinic for a long time for her AUB. She had a late 2020 endometrial ablation by Dr. Hulan Fray. She was first seen by me in March 2021 after an admission on the GYN service for symptomatic anemia where she received 2 units of PRBCs. Currently, she has been on megace and lysteda and was most recently tried on depo provera 2 months to try and control her AUB but w/o much success. She states that her bleeding continues on a daily basis as well as cramps with her bleeding. Her bleeding waxes and wanes in intensity. January 2021 TSH negative  Review of Systems: Pertinent items noted in HPI and remainder of comprehensive ROS otherwise negative.   Patient Active Problem List   Diagnosis Date Noted  . Abdominal pain 03/08/2020  . Chronic diastolic CHF (congestive heart failure) ([place]) 03/08/2020  . Acute encephalopathy 01/29/2020  . AKI (acute kidney injury) ([place]) 01/29/2020  . Symptomatic anemia 12/19/2019  . Elevated sed rate 11/26/2019  . Elevated C-reactive protein (CRP) 11/26/2019  . Hypoglycemia 02/07/2019  . Hypotension 02/07/2019  . Lactic acidosis 02/07/2019  . Right ankle pain 02/07/2019  . Chronic obstructive pulmonary disease ([place]) 02/05/2019  . Seizure ([place]) 07/31/2018  . Vitamin D deficiency 05/31/2017  . Gastroesophageal reflux disease 05/24/2017  . Abnormal uterine bleeding (AUB) 05/17/2017  . Anemia of chronic disease 04/06/2017   . Knee pain, chronic 03/27/2016  . Type 2 diabetes mellitus without complication, with long-term current use of insulin ([place]) 03/27/2016  . Essential hypertension 03/27/2016  . Morbid obesity ([place]) 03/27/2016  . Irritable bowel syndrome with constipation 03/27/2016  . Tobacco dependence 03/27/2016  . CKD (chronic kidney disease) stage 2, GFR 60-89 ml/min 03/25/2016  . Arthropathy, lower leg 05/04/2013  . CTS (carpal tunnel syndrome) 05/04/2013  . Peripheral edema 05/04/2013  . Chronic pain 05/13/2012  . Diabetic gastroparesis ([place]) 11/06/2006  . Hot flashes 11/06/2006    Past Medical History:  Past Medical History:  Diagnosis Date  . Anemia   . Arthritis    knees, hands  . Asthma   . COPD (chronic obstructive pulmonary disease) ([place])   . Diabetes mellitus without complication ([place])    type 2  . Dysfunctional uterine bleeding   . GERD (gastroesophageal reflux disease)   . Hypertension   . Neuromuscular disorder (HCC)    neuropathy feet  . Seizures ([place]) 09/12/2017   pt states r/t stress and blood sugar - no meds last one 4 months ago, not seen neurologist  . Sickle cell trait ([place])   . Smoker   . Vitamin D deficiency 10/2019  . Wears glasses     Past Surgical History:  Past Surgical History:  Procedure Laterality Date  . CESAREAN SECTION     x 1. for twins  . DILATION AND CURETTAGE OF UTERUS N/A 08/20/2019   Procedure: DILATATION AND CURETTAGE;  Surgeon: Emily Filbert, MD;  Location: [place];  Service: Gynecology;  Laterality: N/A;  . ENDOMETRIAL ABLATION N/A 08/20/2019   Procedure: Enis Slipper  Ablation;  Surgeon: Emily Filbert, MD;  Location: [place];  Service: Gynecology;  Laterality: N/A;  . EYE SURGERY Bilateral    laser right and cataract removed left eye  . RADIOLOGY WITH ANESTHESIA N/A 09/16/2019   Procedure: MRI WITH ANESTHESIA   L SPINE WITHOUT CONTRAST, T SPINE WITHOUT CONTRAST , CERVICAL WITHOUT CONTRAST;  Surgeon: Radiologist, Medication, MD;  Location: [place];   Service: Radiology;  Laterality: N/A;  . TUBAL LIGATION     interval BTL  . UPPER GI ENDOSCOPY  07/2017    Past Obstetrical History:  OB History  Gravida Para Term Preterm AB Living  3         3  SAB TAB Ectopic Multiple Live Births          3    # Outcome Date GA Lbr Len/2nd Weight Sex Delivery Anes PTL Lv  3 Gravida           2 Gravida           1 Gravida             Obstetric Comments  Vaginal x 1 and then c-section for twins    Past Gynecological History: As per HPI. History of Pap Smear(s): Yes.   Last pap 2018, which was negaitve and hpv negative  Social History:  Social History   Socioeconomic History  . Marital status: Legally Separated    Spouse name: Not on file  . Number of children: Not on file  . Years of education: Not on file  . Highest education level: Not on file  Occupational History  . Occupation: unemployed  Tobacco Use  . Smoking status: Current Every Day Smoker    Packs/day: 0.50    Years: 26.00    Pack years: 13.00    Types: Cigarettes  . Smokeless tobacco: Never Used  Vaping Use  . Vaping Use: Never used  Substance and Sexual Activity  . Alcohol use: No  . Drug use: No  . Sexual activity: Not Currently    Birth control/protection: None  Other Topics Concern  . Not on file  Social History Narrative  . Not on file   Social Determinants of Health   Financial Resource Strain:   . Difficulty of Paying Living Expenses:   Food Insecurity: No Food Insecurity  . Worried About Charity fundraiser in the Last Year: Never true  . Ran Out of Food in the Last Year: Never true  Transportation Needs: No Transportation Needs  . Lack of Transportation (Medical): No  . Lack of Transportation (Non-Medical): No  Physical Activity:   . Days of Exercise per Week:   . Minutes of Exercise per Session:   Stress:   . Feeling of Stress :   Social Connections:   . Frequency of Communication with Friends and Family:   . Frequency of Social Gatherings  with Friends and Family:   . Attends Religious Services:   . Active Member of Clubs or Organizations:   . Attends Archivist Meetings:   Marland Kitchen Marital Status:   Intimate Partner Violence:   . Fear of Current or Ex-Partner:   . Emotionally Abused:   Marland Kitchen Physically Abused:   . Sexually Abused:     Family History:  Family History  Problem Relation Age of Onset  . Diabetes Mother   . Hypertension Mother     Medications [name] had no medications administered during this visit. Current Outpatient Medications  Medication  Sig Dispense Refill  . acetaminophen-codeine (TYLENOL #3) 300-30 MG tablet Take 1 tablet by mouth every 8 (eight) hours as needed for up to 8 doses for moderate pain. 8 tablet 0  . albuterol (PROVENTIL) (2.5 MG/3ML) 0.083% nebulizer solution Take 3 mLs (2.5 mg total) by nebulization every 6 (six) hours as needed for wheezing or shortness of breath. 150 mL 3  . albuterol (VENTOLIN HFA) 108 (90 Base) MCG/ACT inhaler Inhale 2 puffs into the lungs every 6 (six) hours as needed for wheezing or shortness of breath. 8 g 3  . blood glucose meter kit and supplies KIT Dispense based on patient and insurance preference. Use up to four times daily as directed. (FOR ICD-9 250.00, 250.01). (Patient taking differently: 1 each by Other route See admin instructions. Dispense based on patient and insurance preference. Use up to four times daily as directed. (FOR ICD-9 250.00, 250.01).) 1 each 0  . Blood Glucose Monitoring Suppl (TRUE METRIX METER) w/Device KIT 1 each by Does not apply route 4 (four) times daily -  before meals and at bedtime. 1 kit 0  . budesonide-formoterol (SYMBICORT) 160-4.5 MCG/ACT inhaler INHALE 2 PUFFS INTO THE LUNGS 2 (TWO) TIMES DAILY. (Patient taking differently: Inhale 2 puffs into the lungs 2 (two) times daily. ) 10.2 g 3  . cetirizine (ZYRTEC) 10 MG tablet Take 1 tablet (10 mg total) by mouth daily. 30 tablet 3  . Cholecalciferol (VITAMIN D-3) 125 MCG  (5000 UT) TABS Take 1 tablet by mouth daily. (Patient taking differently: Take 5,000 Units by mouth daily. ) 90 tablet 3  . DULoxetine 40 MG CPEP Take 40 mg by mouth daily. 30 capsule 2  . ferrous sulfate 325 (65 FE) MG tablet Take 1 tablet (325 mg total) by mouth 3 (three) times daily with meals. 30 tablet 3  . fluticasone (FLONASE) 50 MCG/ACT nasal spray Place 2 sprays into both nostrils daily. 16 g 6  . furosemide (LASIX) 40 MG tablet Take 1 tablet (40 mg total) by mouth daily. 30 tablet 0  . Glucosamine Sulfate 1000 MG CAPS Take 1 capsule (1,000 mg total) by mouth 2 (two) times daily. 180 capsule 3  . glucose blood (TRUE METRIX BLOOD GLUCOSE TEST) test strip Use as instructed 100 each 12  . hydrocortisone 2.5 % cream Apply topically 2 (two) times daily. 30 g 2  . Insulin Lispro Prot & Lispro (HUMALOG MIX 75/25 KWIKPEN) (75-25) 100 UNIT/ML Kwikpen INJECT 100 UNITS EVERY 12 HOURS (Patient taking differently: Inject 100 Units into the skin every 12 (twelve) hours. ) 15 mL 11  . Insulin Pen Needle (PEN NEEDLES) 30G X 5 MM MISC 1 Units by Does not apply route as directed. 100 each 11  . losartan (COZAAR) 50 MG tablet Take 1 tablet (50 mg total) by mouth daily. 30 tablet 2  . methylPREDNISolone (MEDROL DOSEPAK) 4 MG TBPK tablet 6 day dose pack - take as directed 21 tablet 0  . metoprolol succinate (TOPROL-XL) 100 MG 24 hr tablet Take 1 tablet (100 mg total) by mouth daily. Take with or immediately following a meal. 90 tablet 3  . omeprazole (PRILOSEC) 40 MG capsule Take 1 capsule (40 mg total) by mouth daily. 30 capsule 3  . potassium chloride SA (KLOR-CON) 20 MEQ tablet Take 1 tablet (20 mEq total) by mouth daily. 30 tablet 3  . promethazine (PHENERGAN) 12.5 MG tablet Take 1 tablet (12.5 mg total) by mouth every 6 (six) hours as needed for nausea or vomiting. 15  tablet 0  . saxagliptin HCl (ONGLYZA) 5 MG TABS tablet Take 1 tablet (5 mg total) by mouth daily. 90 tablet 0  . Tiotropium Bromide  Monohydrate (SPIRIVA RESPIMAT) 2.5 MCG/ACT AERS Inhale 2 puffs into the lungs daily. 4 g 0  . tizanidine (ZANAFLEX) 6 MG capsule TAKE 1 CAPSULE (6 MG TOTAL) BY MOUTH 3 (THREE) TIMES DAILY. 90 capsule 0  . TRUEplus Lancets 28G MISC 1 each by Other route in the morning, at noon, in the evening, and at bedtime.    . Turmeric 500 MG CAPS Take 500 mg by mouth 2 (two) times daily. 180 capsule 3  . Vitamin D, Ergocalciferol, (DRISDOL) 1.25 MG (50000 UNIT) CAPS capsule Take 1 capsule (50,000 Units total) by mouth every 7 (seven) days. (Patient taking differently: Take 50,000 Units by mouth every Friday. ) 5 capsule 6  . Blood Pressure Monitoring (BLOOD PRESSURE KIT) DEVI Check BP BID prn (Patient taking differently: 1 each by Other route 2 (two) times daily as needed (blood pressure). ) 1 each 0  . Lido-Capsaicin-Men-Methyl Sal 0.5-0.035-5-20 % PTCH Apply 1 patch topically daily. (Patient taking differently: Apply 1 patch topically daily as needed (pain). ) 30 patch 2  . saxagliptin HCl (ONGLYZA) 2.5 MG TABS tablet Take 1 tablet (2.5 mg total) by mouth daily. 90 tablet 3   No current facility-administered medications for this visit.    Allergies Ketoprofen, Aspirin, Gabapentin, Ibuprofen, Liraglutide, Naproxen, Omeprazole-sodium bicarbonate, Sulfa antibiotics, and Tramadol   Physical Exam:  BP 117/60   Pulse 87   Ht '5\' 5"'$  (1.651 m)   Wt 269 lb 14.4 oz (122.4 kg)   LMP 03/19/2020   BMI 44.91 kg/m  Body mass index is 44.91 kg/m. General appearance: Well nourished, well developed female in no acute distress.  Neck:  Supple, normal appearance, and no thyromegaly  Cardiovascular: normal s1 and s2.  No murmurs, rubs or gallops. Respiratory:  Clear to auscultation bilateral. Normal respiratory effort Abdomen: obese, well healed vertical midline low belly skin incision Neuro/Psych:  Normal mood and affect.  Skin:  Warm and dry.  Lymphatic:  No inguinal lymphadenopathy.   Pelvic exam: is limited by  body habitus EGBUS: within normal limits Vagina: within normal limits and with small amount of old blood  in the vault Cervix: normal appearing cervix without tenderness, discharge or lesions. Moderate decensus Uterus:  nonenlarged and non tender Adnexa:  normal adnexa and no mass, fullness, tenderness Rectovaginal: deferred  Laboratory: no new imaging  Radiology:  Narrative & Impression  CLINICAL DATA:  44 year old female with history of abnormal vaginal bleeding. Prior history of uterine ablation in November 2020.  EXAM: TRANSABDOMINAL AND TRANSVAGINAL ULTRASOUND OF PELVIS  TECHNIQUE: Both transabdominal and transvaginal ultrasound examinations of the pelvis were performed. Transabdominal technique was performed for global imaging of the pelvis including uterus, ovaries, adnexal regions, and pelvic cul-de-sac. It was necessary to proceed with endovaginal exam following the transabdominal exam to visualize the adnexa.  COMPARISON:  Pelvic ultrasound 05/17/2017.  FINDINGS: Uterus  Measurements: 11.7 x 7.0 x 6.5 cm = volume: 268 mL. No fibroids or other mass visualized.  Endometrium  Thickness: 15.3 mm. On sagittal image 103 of the cine capture through the uterus there is a nodular appearance of the endometrium measuring approximately 1.7 x 1.7 cm where there is some heterogeneous internal echogenicity.  Right ovary  Measurements: 4.0 x 2.4 x 2.7 cm = volume: 13.6 mL. Normal appearance/no adnexal mass.  Left ovary  Measurements: 5.2 x 4.4 x  5.8 cm = volume: 70.5 mL. Normal appearance/no adnexal mass.  Other findings  No abnormal free fluid.  IMPRESSION: 1. Possible 1.7 cm endometrial lesion versus subserosal fibroid, as discussed above. Correlation with nonemergent sonohysterography is suggested in the near future to better evaluate this finding.   Electronically Signed   By: Vinnie Langton M.D.   On: 12/20/2019 07:36     Assessment:  pt stable  Plan:  1. Anxiety - Ambulatory referral to [facility]  2. Other depression - Ambulatory referral to [place]  3. Abnormal uterine bleeding (AUB) Long d/w pt re: next steps. I told her that medical options are exhausted at this point given she's tried PO progestin and depo provera. I d/w her that I wouldn't recommend IUD given her endometrial ablation history. I told her that at this point her only real option is major surgery which would be a hysterectomy. I told her that she is extremely high risk for this surgery given her multiple medical co-morbidities and that she would need clearance from her PCP and multiple specialties before moving forward with surgery can be done; she states that she has a sleep study appointment for later this month.   I do believe it's reasonable to try a vaginal hysterectomy. She is still smoking 1/2 ppd and I counseled her on this. Despite this, I feel that a five day Lysteda course is reasonable to help slow down her bleeding. I'll also check a CBC today too.  I also told her that I don't believe a D&C would be beneficial due to her endometrial ablation history. Pt to consider options and let me know.   - CBC - Follicle stimulating hormone  4. Hot flashes Will check [place] level. - Follicle stimulating hormone  Orders Placed This Encounter  Procedures  . CBC  . Follicle stimulating hormone  . Ambulatory referral to [place]    RTC PRN  Durene Romans MD Attending Center for [facility])

## 2020-04-26 ENCOUNTER — Telehealth: Payer: Self-pay | Admitting: *Deleted

## 2020-04-26 NOTE — Telephone Encounter (Signed)
Pt called twice after I left message this morning.

## 2020-04-26 NOTE — Telephone Encounter (Signed)
Left message requesting a call back to discuss her questions concerning surgery.

## 2020-04-26 NOTE — Telephone Encounter (Signed)
Dr. Logan Bores Message Text, The only procedure is an Endoscopic Plantar Fasciotomy for the plantar fasciitis. Currently all other issues with her foot are asymptomatic.

## 2020-04-26 NOTE — Telephone Encounter (Signed)
Pt left VM message stating that she had sent a MyChart message and she has other questions as well. She requests a call back.

## 2020-04-27 ENCOUNTER — Telehealth: Payer: Self-pay | Admitting: Obstetrics and Gynecology

## 2020-04-27 NOTE — Telephone Encounter (Signed)
Returned patients call. Patient did not answer. LM for her to call the office for further questions or concerns. No My Chart message noted in the chart that patient indicated she sent since her last visit.

## 2020-04-27 NOTE — Telephone Encounter (Signed)
GYN Telephone Note Patient called [phone] and I talked to her to confirm her information but then her phone cut off. I called back twice and it went straight to voicemail. I will try again later.  Cornelia Copa MD Attending Center for [employer] (Faculty Practice) 04/27/2020 Time: 1129am

## 2020-04-27 NOTE — Telephone Encounter (Signed)
GYN Telephone Note D/w her re: lab results which don't show menopause and CBC which is better than before and she's back at her baseline. Pharmacy just got the [facility] and she started it yesterday and bleeding is the same. Pt to consider options and let me know how she'd like to proceed. Options reviewed with her again.   Cornelia Copa MD Attending Center for [employer] (Faculty [facility]) 04/27/2020 Time: [phone]

## 2020-04-29 MED FILL — METOPROLOL SUCCINATE ER 100: 100 | 30 days supply | Qty: 30 | Fill #3

## 2020-04-29 MED FILL — OMEPRAZOLE DR 40 MG CAPSULE: 40 | 30 days supply | Qty: 30 | Fill #3

## 2020-04-30 ENCOUNTER — Ambulatory Visit (HOSPITAL_COMMUNITY): Payer: Medicaid Other | Attending: Cardiovascular Disease

## 2020-04-30 ENCOUNTER — Other Ambulatory Visit: Payer: Self-pay

## 2020-04-30 DIAGNOSIS — I10 Essential (primary) hypertension: Secondary | ICD-10-CM

## 2020-04-30 LAB — ECHOCARDIOGRAM COMPLETE
Area-P 1/2: 8.92 cm2
S' Lateral: 2.7 cm

## 2020-04-30 NOTE — Progress Notes (Signed)
Patient ID: Jill Shaw, female   DOB: 10/22/1975, 44 y.o.   MRN: [id number]    Patient arrived late for the echocardiogram. She stated that she went to the wrong building.

## 2020-05-03 ENCOUNTER — Ambulatory Visit: Payer: Self-pay | Admitting: Obstetrics and Gynecology

## 2020-05-03 ENCOUNTER — Telehealth: Payer: Self-pay | Admitting: Podiatry

## 2020-05-03 ENCOUNTER — Telehealth: Payer: Self-pay | Admitting: Obstetrics and Gynecology

## 2020-05-03 NOTE — BH Specialist Note (Deleted)
Integrated Behavioral Health via Telemedicine Video (Caregility) Visit  05/03/2020 Jill Shaw [id number]  Number of Integrated Behavioral Health visits: 1 Session Start time: 9:15***  Session End time: 10:15*** Total time: {IBH Total DDUK:02542706}  Referring Provider: Alma Bing, [facility] Type of Visit: Video Patient/Family location: Home [name]  All persons participating in visit: Patient *** and [facility] Aniyha Tate ***   Confirmed patient's address: Yes  Confirmed patient's phone number: Yes  Any changes to demographics: No   Confirmed patient's insurance: Yes  Any changes to patient's insurance: No   Discussed confidentiality: Yes ***  I connected with [name]  by a video enabled telemedicine application (Caregility) and verified that I am speaking with the correct person using two identifiers.     I discussed the limitations of evaluation and management by telemedicine and the availability of in person appointments.  I discussed that the purpose of this visit is to provide behavioral health care while limiting exposure to the novel coronavirus.   Discussed there is a possibility of technology failure and discussed alternative modes of communication if that failure occurs.  I discussed that engaging in this virtual visit, they consent to the provision of behavioral healthcare and the services will be billed under their insurance.  Patient and/or legal guardian expressed understanding and consented to virtual visit: Yes   PRESENTING CONCERNS: Patient and/or family reports the following symptoms/concerns: *** Duration of problem: ***; Severity of problem: {Mild/Moderate/Severe:20260}  STRENGTHS (Protective Factors/Coping Skills): ***  GOALS ADDRESSED: Patient will: 1.  Reduce symptoms of: {IBH Symptoms:21014056}  2.  Increase knowledge and/or ability of: {IBH Patient  Tools:21014057}  3.  Demonstrate ability to: {IBH Goals:21014053}  INTERVENTIONS: Interventions utilized:  {IBH Interventions:21014054} Standardized Assessments completed: {IBH Screening Tools:21014051}  ASSESSMENT: Patient currently experiencing ***.   Patient may benefit from psychoeducation and brief therapeutic interventions regarding coping with symptoms of *** .  PLAN: 1. Follow up with behavioral health clinician on : *** 2. Behavioral recommendations:  -*** -*** 3. Referral(s): {IBH Referrals:21014055}  I discussed the assessment and treatment plan with the patient and/or parent/guardian. They were provided an opportunity to ask questions and all were answered. They agreed with the plan and demonstrated an understanding of the instructions.   They were advised to call back or seek an in-person evaluation if the symptoms worsen or if the condition fails to improve as anticipated.  Valetta Close [facility]  Depression screen [facility] 2/9 04/23/2020 03/19/2020 03/02/2020 12/26/2019 12/24/2019  Decreased Interest 3 - 2 0 1  Down, Depressed, Hopeless 3 2 2 1 2   PHQ - 2 Score 6 2 4 1 3   Altered sleeping 3 3 3  - 2  Tired, decreased energy 3 3 3  - 1  Change in appetite 2 3 3  - 0  Feeling bad or failure about yourself  3 0 0 - 0  Trouble concentrating 3 1 0 - 2  Moving slowly or fidgety/restless 0 0 0 - 0  Suicidal thoughts - 0 0 - 0  PHQ-9 Score 20 12 13  - 8  Some recent data might be hidden   GAD 7 : Generalized Anxiety Score 04/23/2020 03/02/2020 12/24/2019  Nervous, Anxious, on Edge 3 0 2  Control/stop worrying 3 3 2   Worry too much - different things 3 3 2   Trouble relaxing 2 2 2   Restless 0 0 2  Easily annoyed or irritable 3 0 0  Afraid -  awful might happen 3 0 0  Total GAD 7 Score 17 8 10

## 2020-05-03 NOTE — Telephone Encounter (Signed)
Patient called to say she needed to speak with Dr [name] about her medication. She stated she spoke with him on Sunday, and was told to call back on Monday. Patient has requested a call back for Dr [name]. She also cancelled her [facility] appointment. She stated she would call back later.

## 2020-05-03 NOTE — Telephone Encounter (Signed)
Pt called stating that she had questions from her appointment

## 2020-05-04 ENCOUNTER — Telehealth: Payer: Self-pay

## 2020-05-04 NOTE — Telephone Encounter (Signed)
Patient is returning call to discuss results. 

## 2020-05-04 NOTE — Telephone Encounter (Addendum)
Left a voice message for the patient to give the office a call back for her results.  ----- Message from Runell Gess, MD sent at 05/01/2020  6:26 PM EDT ----- Nl LV systolic FXN with G1DD

## 2020-05-04 NOTE — Telephone Encounter (Signed)
Pt returned call askes if someone can call her back to discuss appt

## 2020-05-04 NOTE — Telephone Encounter (Signed)
error 

## 2020-05-04 NOTE — Telephone Encounter (Signed)
Left message for pt to call to discuss her appt f 04/21/2020.

## 2020-05-04 NOTE — Telephone Encounter (Signed)
Called patient, gave ECHO results.  Patient verbalized understanding.   

## 2020-05-05 ENCOUNTER — Ambulatory Visit (INDEPENDENT_AMBULATORY_CARE_PROVIDER_SITE_OTHER): Payer: Self-pay | Admitting: Family Medicine

## 2020-05-05 ENCOUNTER — Encounter: Payer: Self-pay | Admitting: Family Medicine

## 2020-05-05 ENCOUNTER — Other Ambulatory Visit: Payer: Self-pay

## 2020-05-05 DIAGNOSIS — G8929 Other chronic pain: Secondary | ICD-10-CM

## 2020-05-05 DIAGNOSIS — M25561 Pain in right knee: Secondary | ICD-10-CM

## 2020-05-05 MED ORDER — PENNSAID 2 % EX SOLN: CUTANEOUS | 6 refills | Status: DC

## 2020-05-05 NOTE — Progress Notes (Signed)
I saw and examined the patient with Dr. Marga Hoots and agree with assessment and plan as outlined.    Ongoing right knee pain.  Prior x-rays and MRI have been unremarkable.  Doesn't have insurance coverage for pain clinic yet.  Not interested in injections.  Will try [place] and physical therapy.

## 2020-05-05 NOTE — Progress Notes (Signed)
Office Visit Note   Patient: Jill Shaw           Date of Birth: 1976-04-23           MRN: [id number] Visit Date: 05/05/2020 Requested by: Barbette Merino, NP [address] [place] [place],  [place] [address] PCP: Barbette Merino, NP  Subjective: Chief Complaint  Patient presents with  . pain both legs, mid-thighs down    HPI: Patient is a 44 year old female presenting to clinic to follow-up on bilateral knee pain, R>L.  At her last encounter it was recommended that patient see a pain clinic, however she states she was unable to do this due to insurance difficulties.  She states that her pain holds her back significantly throughout the day, rating it as high as 9 out of 10 on days of "bad weather."  She endorses stiffness in both knees though states her right is far worse than the left.  She says that she was instructed by her PCM to avoid frequent Tylenol or Advil use, and is interested in finding something that can make her more comfortable throughout the day.  She is not interested in injection therapies, as she states she has had poor reactions to these in the past.  She has never tried physical therapy for her knee concerns.              ROS:   All other systems were reviewed and are negative.  Objective: Vital Signs: There were no vitals taken for this visit.  Physical Exam:  General:  Alert and oriented, in no acute distress. Pulm:  Breathing unlabored. Psy:  Normal mood, congruent affect. Skin:  No bruising or rashes appreciated.   Right knee:  Antalgic gait, favoring right leg.  Inspection: No varus/valgus deformity of the knees.  Palpation: minimal patellar crepitus appreciated. Tenderness to patellar compression. Tenderness with palpation of both medial and lateral joint lines on bilateral knees.  Endorses pain with anterior/posterior drawer, though she has a strong end-point with this test. Similarly, pain with varus/valgus stress of the knee, but has strong end-points.  Negative McMurray.   Imaging: No results found.  Assessment & Plan: 44 year old female presenting to clinic to follow-up on bilateral, right worse than left, knee pain.  She was never able to follow-up with previously recommended pain clinic due to insurance complications. She is interested in topical medications that may improve her symptoms, and will prescribe Pennsaid cream.  Patient has also never tried physical therapy in the past and may benefit from this treatment modality.  She has no further questions or concerns at this time.     Procedures: No procedures performed  No notes on file     PMFS History: Patient Active Problem List   Diagnosis Date Noted  . Abdominal pain 03/08/2020  . Chronic diastolic CHF (congestive heart failure) (HCC) 03/08/2020  . Acute encephalopathy 01/29/2020  . AKI (acute kidney injury) (HCC) 01/29/2020  . Symptomatic anemia 12/19/2019  . Elevated sed rate 11/26/2019  . Elevated C-reactive protein (CRP) 11/26/2019  . Hypoglycemia 02/07/2019  . Hypotension 02/07/2019  . Lactic acidosis 02/07/2019  . Right ankle pain 02/07/2019  . Chronic obstructive pulmonary disease (HCC) 02/05/2019  . Seizure (HCC) 07/31/2018  . Vitamin D deficiency 05/31/2017  . Gastroesophageal reflux disease 05/24/2017  . Abnormal uterine bleeding (AUB) 05/17/2017  . Anemia of chronic disease 04/06/2017  . Knee pain, chronic 03/27/2016  . Type 2 diabetes mellitus without complication, with long-term current  use of insulin (HCC) 03/27/2016  . Essential hypertension 03/27/2016  . Morbid obesity (HCC) 03/27/2016  . Irritable bowel syndrome with constipation 03/27/2016  . Tobacco dependence 03/27/2016  . CKD (chronic kidney disease) stage 2, GFR 60-89 ml/min 03/25/2016  . Arthropathy, lower leg 05/04/2013  . CTS (carpal tunnel syndrome) 05/04/2013  . Peripheral edema 05/04/2013  . Chronic pain 05/13/2012  . Diabetic gastroparesis (HCC) 11/06/2006  . Hot flashes  11/06/2006   Past Medical History:  Diagnosis Date  . Anemia   . Arthritis    knees, hands  . Asthma   . COPD (chronic obstructive pulmonary disease) (HCC)   . Diabetes mellitus without complication (HCC)    type 2  . Dysfunctional uterine bleeding   . GERD (gastroesophageal reflux disease)   . Hypertension   . Neuromuscular disorder (HCC)    neuropathy feet  . Seizures (HCC) 09/12/2017   pt states r/t stress and blood sugar - no meds last one 4 months ago, not seen neurologist  . Sickle cell trait (HCC)   . Smoker   . Vitamin D deficiency 10/2019  . Wears glasses     Family History  Problem Relation Age of Onset  . Diabetes Mother   . Hypertension Mother     Past Surgical History:  Procedure Laterality Date  . CESAREAN SECTION     x 1. for twins  . DILATION AND CURETTAGE OF UTERUS N/A 08/20/2019   Procedure: DILATATION AND CURETTAGE;  Surgeon: Allie Bossier, MD;  Location: [facility] OR;  Service: Gynecology;  Laterality: N/A;  . ENDOMETRIAL ABLATION N/A 08/20/2019   Procedure: Minerva Ablation;  Surgeon: Allie Bossier, MD;  Location: [facility] OR;  Service: Gynecology;  Laterality: N/A;  . EYE SURGERY Bilateral    laser right and cataract removed left eye  . RADIOLOGY WITH ANESTHESIA N/A 09/16/2019   Procedure: MRI WITH ANESTHESIA   L SPINE WITHOUT CONTRAST, T SPINE WITHOUT CONTRAST , CERVICAL WITHOUT CONTRAST;  Surgeon: Radiologist, Medication, MD;  Location: [facility] OR;  Service: Radiology;  Laterality: N/A;  . TUBAL LIGATION     interval BTL  . UPPER GI ENDOSCOPY  07/2017   Social History   Occupational History  . Occupation: unemployed  Tobacco Use  . Smoking status: Current Every Day Smoker    Packs/day: 0.50    Years: 26.00    Pack years: 13.00    Types: Cigarettes  . Smokeless tobacco: Never Used  Vaping Use  . Vaping Use: Never used  Substance and Sexual Activity  . Alcohol use: No  . Drug use: No  . Sexual activity: Not Currently    Birth control/protection: None

## 2020-05-06 ENCOUNTER — Telehealth: Payer: Self-pay | Admitting: Family Medicine

## 2020-05-06 ENCOUNTER — Telehealth: Payer: Self-pay | Admitting: Obstetrics and Gynecology

## 2020-05-06 MED ORDER — OXYCODONE HCL 5 MG PO TABS: 5.0000 mg | ORAL_TABLET | Freq: Four times a day (QID) | ORAL | 0 refills | Status: DC | PRN

## 2020-05-06 NOTE — Telephone Encounter (Signed)
GYN Telephone Note Lysteda did not help so she started the Aygestin on Monday, which she says stops the bleeding but causes abdominal pain, which is what is happening currently except for somquarter sized clots today and currently no pain. She is requesting pain medications because of the pain to where she can't even walk or get out of bed.   Long discussion with the patient in terms of the next steps. I told her that we are exahusted on options and that she has had an endometrial ablation, she has tried various progestin only pills, Lysteda and depo provera and none has been satisfactory, with admissions for AUB and need for transfusions.  I told her that even with all of her medical problems, I feel a hysterectomy is the best course of action and that I would try and do it vaginally. She is wondering about a partial hysterectomy and I told her I don't recommend b/c it would have to be an open procedure or l/s and if she's going through the risk of those procedures that she should have her cervix and uterus removed to eliminate any possibility of future bleeding.   In regards to the pain meds, I told her that I feel comfortable giving her some for a week and to be seen next week but to be on chronic pain meds from the bleeding is not a viable long term solution, given her age and I don't feel comfortable with that. I told her that she could stay on the Aygestin if she's okay dealing with the discomfort it causes. She has reservations re: the hysterectomy which I told her is valid and I don't take a hysterectomy lightly. I also told her there are issues with being on opiates long term. I told her that if she is wanting to avoid surgery and be on long term pain medications that we can talk more next week but that I recommend to strongly consider getting a 2nd GYN opinion to see what another doctor thinks.  Oxycodone 5mg  #30 sent in  MD Attending [facility] (Faculty  Practice) 05/06/2020 Time: [phone]

## 2020-05-06 NOTE — Telephone Encounter (Signed)
Patient called. She would like Terri to call her. Has some questions about something in her AVS. Her call back number is [phone]

## 2020-05-07 ENCOUNTER — Encounter: Payer: Self-pay | Admitting: Nurse Practitioner

## 2020-05-07 ENCOUNTER — Other Ambulatory Visit: Payer: Self-pay

## 2020-05-07 ENCOUNTER — Ambulatory Visit (INDEPENDENT_AMBULATORY_CARE_PROVIDER_SITE_OTHER): Payer: Self-pay | Admitting: Nurse Practitioner

## 2020-05-07 ENCOUNTER — Other Ambulatory Visit: Payer: Self-pay | Admitting: Obstetrics and Gynecology

## 2020-05-07 VITALS — BP 161/70 | HR 95 | Temp 98.9°F | Resp 16 | Ht 65.0 in | Wt 271.0 lb

## 2020-05-07 DIAGNOSIS — D72820 Lymphocytosis (symptomatic): Secondary | ICD-10-CM

## 2020-05-07 DIAGNOSIS — Z794 Long term (current) use of insulin: Secondary | ICD-10-CM

## 2020-05-07 DIAGNOSIS — L819 Disorder of pigmentation, unspecified: Secondary | ICD-10-CM

## 2020-05-07 DIAGNOSIS — E119 Type 2 diabetes mellitus without complications: Secondary | ICD-10-CM

## 2020-05-07 MED ORDER — OXYCODONE HCL 5 MG PO TABS: 5.0000 mg | ORAL_TABLET | Freq: Four times a day (QID) | ORAL | 0 refills | Status: DC | PRN

## 2020-05-07 MED ORDER — HYDROQUINONE 4 % EX CREA: TOPICAL_CREAM | Freq: Two times a day (BID) | CUTANEOUS | 0 refills | Status: DC

## 2020-05-07 NOTE — Progress Notes (Signed)
Pain meds sent per Dr. [name] telephone note, she will have discussion next week regarding long term management.

## 2020-05-07 NOTE — Telephone Encounter (Signed)
Tried calling. No answer. LMVM advising returning call to discuss.

## 2020-05-07 NOTE — Telephone Encounter (Signed)
Returned patients call. She did not answer. LM for patient to call the office at her convenience.   Patient message routed to Dr. [name].

## 2020-05-07 NOTE — Progress Notes (Signed)
 [facility] [address] [place], [place]  [address] Phone:  [phone]   Fax:  [phone]   Acute Office Visit  Subjective:    Patient ID: Jill Shaw, female    DOB: 04/15/1976, 44 y.o.   MRN: [id number]  Chief Complaint  Patient presents with  . Skin Discoloration    on face     HPI Patient is in today for skin changes. She  has a past medical history of Anemia, Arthritis, Asthma, COPD (chronic obstructive pulmonary disease) (HCC), Diabetes mellitus without complication (HCC), Dysfunctional uterine bleeding, GERD (gastroesophageal reflux disease), Hypertension, Neuromuscular disorder (HCC), Seizures (HCC) (09/12/2017), Sickle cell trait (HCC), Smoker, Vitamin D deficiency (10/2019), and Wears glasses.    Skin Discoloration The discoloration began a few weeks ago. It is located on her face and cheeks, and consists of light spots and loss of pigment. It has been associated with burning. Treatment has been tried with no medication   Past Medical History:  Diagnosis Date  . Anemia   . Arthritis    knees, hands  . Asthma   . COPD (chronic obstructive pulmonary disease) (HCC)   . Diabetes mellitus without complication (HCC)    type 2  . Dysfunctional uterine bleeding   . GERD (gastroesophageal reflux disease)   . Hypertension   . Neuromuscular disorder (HCC)    neuropathy feet  . Seizures (HCC) 09/12/2017   pt states r/t stress and blood sugar - no meds last one 4 months ago, not seen neurologist  . Sickle cell trait (HCC)   . Smoker   . Vitamin D deficiency 10/2019  . Wears glasses     Past Surgical History:  Procedure Laterality Date  . CESAREAN SECTION     x 1. for twins  . DILATION AND CURETTAGE OF UTERUS N/A 08/20/2019   Procedure: DILATATION AND CURETTAGE;  Surgeon: Dove, Myra C, MD;  Location: [facility];  Service: Gynecology;  Laterality: N/A;  . ENDOMETRIAL ABLATION N/A 08/20/2019   Procedure: Minerva Ablation;  Surgeon: Dove, Myra C,  MD;  Location: [facility] OR;  Service: Gynecology;  Laterality: N/A;  . EYE SURGERY Bilateral    laser right and cataract removed left eye  . RADIOLOGY WITH ANESTHESIA N/A 09/16/2019   Procedure: MRI WITH ANESTHESIA   L SPINE WITHOUT CONTRAST, T SPINE WITHOUT CONTRAST , CERVICAL WITHOUT CONTRAST;  Surgeon: Radiologist, Medication, MD;  Location: [facility] OR;  Service: Radiology;  Laterality: N/A;  . TUBAL LIGATION     interval BTL  . UPPER GI ENDOSCOPY  07/2017    Family History  Problem Relation Age of Onset  . Diabetes Mother   . Hypertension Mother     Social History   Socioeconomic History  . Marital status: Legally Separated    Spouse name: Not on file  . Number of children: Not on file  . Years of education: Not on file  . Highest education level: Not on file  Occupational History  . Occupation: unemployed  Tobacco Use  . Smoking status: Current Every Day Smoker    Packs/day: 0.50    Years: 26.00    Pack years: 13.00    Types: Cigarettes  . Smokeless tobacco: Never Used  Vaping Use  . Vaping Use: Never used  Substance and Sexual Activity  . Alcohol use: No  . Drug use: No  . Sexual activity: Not Currently    Birth control/protection: None  Other Topics Concern  . Not on file    Social History Narrative  . Not on file   Social Determinants of Health   Financial Resource Strain:   . Difficulty of Paying Living Expenses:   Food Insecurity: No Food Insecurity  . Worried About Charity fundraiser in the Last Year: Never true  . Ran Out of Food in the Last Year: Never true  Transportation Needs: No Transportation Needs  . Lack of Transportation (Medical): No  . Lack of Transportation (Non-Medical): No  Physical Activity:   . Days of Exercise per Week:   . Minutes of Exercise per Session:   Stress:   . Feeling of Stress :   Social Connections:   . Frequency of Communication with Friends and Family:   . Frequency of Social Gatherings with Friends and Family:   . Attends  Religious Services:   . Active Member of Clubs or Organizations:   . Attends Archivist Meetings:   Marland Kitchen Marital Status:   Intimate Partner Violence:   . Fear of Current or Ex-Partner:   . Emotionally Abused:   Marland Kitchen Physically Abused:   . Sexually Abused:     Outpatient Medications Prior to Visit  Medication Sig Dispense Refill  . acetaminophen-codeine (TYLENOL #3) 300-30 MG tablet Take 1 tablet by mouth every 8 (eight) hours as needed for up to 8 doses for moderate pain. 8 tablet 0  . albuterol (PROVENTIL) (2.5 MG/3ML) 0.083% nebulizer solution Take 3 mLs (2.5 mg total) by nebulization every 6 (six) hours as needed for wheezing or shortness of breath. 150 mL 3  . albuterol (VENTOLIN HFA) 108 (90 Base) MCG/ACT inhaler Inhale 2 puffs into the lungs every 6 (six) hours as needed for wheezing or shortness of breath. 8 g 3  . blood glucose meter kit and supplies KIT Dispense based on patient and insurance preference. Use up to four times daily as directed. (FOR ICD-9 250.00, 250.01). (Patient taking differently: 1 each by Other route See admin instructions. Dispense based on patient and insurance preference. Use up to four times daily as directed. (FOR ICD-9 250.00, 250.01).) 1 each 0  . Blood Glucose Monitoring Suppl (TRUE METRIX METER) w/Device KIT 1 each by Does not apply route 4 (four) times daily -  before meals and at bedtime. 1 kit 0  . Blood Pressure Monitoring (BLOOD PRESSURE KIT) DEVI Check BP BID prn (Patient taking differently: 1 each by Other route 2 (two) times daily as needed (blood pressure). ) 1 each 0  . budesonide-formoterol (SYMBICORT) 160-4.5 MCG/ACT inhaler INHALE 2 PUFFS INTO THE LUNGS 2 (TWO) TIMES DAILY. (Patient taking differently: Inhale 2 puffs into the lungs 2 (two) times daily. ) 10.2 g 3  . cetirizine (ZYRTEC) 10 MG tablet Take 1 tablet (10 mg total) by mouth daily. 30 tablet 3  . Cholecalciferol (VITAMIN D-3) 125 MCG (5000 UT) TABS Take 1 tablet by mouth daily.  (Patient taking differently: Take 5,000 Units by mouth daily. ) 90 tablet 3  . Diclofenac Sodium (PENNSAID) 2 % SOLN Apply QID prn 112 g 6  . DULoxetine 40 MG CPEP Take 40 mg by mouth daily. 30 capsule 2  . ferrous sulfate 325 (65 FE) MG tablet Take 1 tablet (325 mg total) by mouth 3 (three) times daily with meals. 30 tablet 3  . fluticasone (FLONASE) 50 MCG/ACT nasal spray Place 2 sprays into both nostrils daily. 16 g 6  . furosemide (LASIX) 40 MG tablet Take 1 tablet (40 mg total) by mouth daily. 30 tablet 0  .  Glucosamine Sulfate 1000 MG CAPS Take 1 capsule (1,000 mg total) by mouth 2 (two) times daily. 180 capsule 3  . glucose blood (TRUE METRIX BLOOD GLUCOSE TEST) test strip Use as instructed 100 each 12  . hydrocortisone 2.5 % cream Apply topically 2 (two) times daily. 30 g 2  . Insulin Lispro Prot & Lispro (HUMALOG MIX 75/25 KWIKPEN) (75-25) 100 UNIT/ML Kwikpen INJECT 100 UNITS EVERY 12 HOURS (Patient taking differently: Inject 100 Units into the skin every 12 (twelve) hours. ) 15 mL 11  . Insulin Pen Needle (PEN NEEDLES) 30G X 5 MM MISC 1 Units by Does not apply route as directed. 100 each 11  . losartan (COZAAR) 50 MG tablet Take 1 tablet (50 mg total) by mouth daily. 30 tablet 2  . metoprolol succinate (TOPROL-XL) 100 MG 24 hr tablet Take 1 tablet (100 mg total) by mouth daily. Take with or immediately following a meal. 90 tablet 3  . omeprazole (PRILOSEC) 40 MG capsule Take 1 capsule (40 mg total) by mouth daily. 30 capsule 3  . oxyCODONE (ROXICODONE) 5 MG immediate release tablet Take 1-2 tablets (5-10 mg total) by mouth every 6 (six) hours as needed for severe pain. 30 tablet 0  . potassium chloride SA (KLOR-CON) 20 MEQ tablet Take 1 tablet (20 mEq total) by mouth daily. 30 tablet 3  . promethazine (PHENERGAN) 12.5 MG tablet Take 1 tablet (12.5 mg total) by mouth every 6 (six) hours as needed for nausea or vomiting. 15 tablet 0  . saxagliptin HCl (ONGLYZA) 2.5 MG TABS tablet Take 1  tablet (2.5 mg total) by mouth daily. 90 tablet 3  . saxagliptin HCl (ONGLYZA) 5 MG TABS tablet Take 1 tablet (5 mg total) by mouth daily. 90 tablet 0  . Tiotropium Bromide Monohydrate (SPIRIVA RESPIMAT) 2.5 MCG/ACT AERS Inhale 2 puffs into the lungs daily. 4 g 0  . tizanidine (ZANAFLEX) 6 MG capsule TAKE 1 CAPSULE (6 MG TOTAL) BY MOUTH 3 (THREE) TIMES DAILY. 90 capsule 0  . tranexamic acid (LYSTEDA) 650 MG TABS tablet Take 2 tablets (1,300 mg total) by mouth 3 (three) times daily. Take during menses for a maximum of five days 30 tablet 0  . TRUEplus Lancets 28G MISC 1 each by Other route in the morning, at noon, in the evening, and at bedtime.    . Turmeric 500 MG CAPS Take 500 mg by mouth 2 (two) times daily. 180 capsule 3  . Vitamin D, Ergocalciferol, (DRISDOL) 1.25 MG (50000 UNIT) CAPS capsule Take 1 capsule (50,000 Units total) by mouth every 7 (seven) days. (Patient taking differently: Take 50,000 Units by mouth every Friday. ) 5 capsule 6  . Lido-Capsaicin-Men-Methyl Sal 0.5-0.035-5-20 % PTCH Apply 1 patch topically daily. (Patient taking differently: Apply 1 patch topically daily as needed (pain). ) 30 patch 2  . methylPREDNISolone (MEDROL DOSEPAK) 4 MG TBPK tablet 6 day dose pack - take as directed 21 tablet 0   No facility-administered medications prior to visit.    Allergies  Allergen Reactions  . Ketoprofen Nausea And Vomiting  . Aspirin Nausea Only  . Gabapentin Nausea And Vomiting    upset stomach  . Ibuprofen Nausea And Vomiting  . Liraglutide Nausea And Vomiting  . Naproxen Nausea And Vomiting  . Omeprazole-Sodium Bicarbonate Nausea And Vomiting  . Sulfa Antibiotics Nausea And Vomiting  . Tramadol Nausea And Vomiting    stomach upset    Review of Systems  Skin: Positive for color change.  All other systems  reviewed and are negative.      Objective:    Physical Exam Constitutional:      General: She is not in acute distress.    Appearance: She is obese. She is  not ill-appearing.  HENT:     Head: Normocephalic and atraumatic.     Nose: Nose normal.     Mouth/Throat:     Mouth: Mucous membranes are moist.  Cardiovascular:     Rate and Rhythm: Normal rate and regular rhythm.     Pulses: Normal pulses.     Heart sounds: Normal heart sounds.  Pulmonary:     Effort: Pulmonary effort is normal.     Breath sounds: Wheezing present.  Musculoskeletal:        General: Normal range of motion.  Skin:    General: Skin is warm.     Findings: Erythema present.          Comments: Multiple tattoos  Neurological:     Mental Status: She is alert and oriented to person, place, and time.  Psychiatric:        Mood and Affect: Mood normal.        Behavior: Behavior normal.        Thought Content: Thought content normal.        Judgment: Judgment normal.     BP (!) 161/70 (BP Location: Left Arm, Patient Position: Sitting, Cuff Size: Large)   Pulse 95   Temp 98.9 F (37.2 C) (Oral)   Resp 16   Ht 5' 5" (1.651 m)   Wt (!) 271 lb (122.9 kg)   SpO2 100%   BMI 45.10 kg/m  Wt Readings from Last 3 Encounters:  05/07/20 (!) 271 lb (122.9 kg)  04/23/20 269 lb 14.4 oz (122.4 kg)  04/09/20 265 lb 6.4 oz (120.4 kg)    Health Maintenance Due  Topic Date Due  . COVID-19 Vaccine (1) Never done  . FOOT EXAM  09/22/2017  . OPHTHALMOLOGY EXAM  11/16/2017    There are no preventive care reminders to display for this patient.   Lab Results  Component Value Date   TSH 3.100 11/05/2019   Lab Results  Component Value Date   WBC 11.2 (H) 04/23/2020   HGB 9.5 (L) 04/23/2020   HCT 31.1 (L) 04/23/2020   MCV 74 (L) 04/23/2020   PLT 259 04/23/2020   Lab Results  Component Value Date   NA 136 03/10/2020   K 3.9 03/10/2020   CO2 23 03/10/2020   GLUCOSE 295 (H) 03/10/2020   BUN 17 03/10/2020   CREATININE 1.25 (H) 03/10/2020   BILITOT 0.7 03/07/2020   ALKPHOS 98 03/07/2020   AST 35 03/07/2020   ALT 15 03/07/2020   PROT 8.1 03/07/2020   ALBUMIN 4.3  03/07/2020   CALCIUM 8.6 (L) 03/10/2020   ANIONGAP 8 03/10/2020   Lab Results  Component Value Date   CHOL 151 11/05/2019   Lab Results  Component Value Date   HDL 55 11/05/2019   Lab Results  Component Value Date   LDLCALC 63 11/05/2019   Lab Results  Component Value Date   TRIG 205 (H) 11/05/2019   Lab Results  Component Value Date   CHOLHDL 2.7 11/05/2019   Lab Results  Component Value Date   HGBA1C 9.5 (H) 01/30/2020       Assessment & Plan:   Problem List Items Addressed This Visit      Endocrine   Type 2 diabetes mellitus without complication, with   long-term current use of insulin (HCC)   Relevant Orders   Lipid panel   Comp. Metabolic Panel (12) Patient to return for fasting labs and follow up apt in 3 weeks    Other Visit Diagnoses    Discoloration of skin of face    -  Primary hydroquinone 4 % cream and sunscreen to protect skin Encourage pt to use caution with which mask materials that touches her face   Lymphocytosis       Relevant Orders   CBC with Differential/Platelet       Meds ordered this encounter  Medications  . hydroquinone 4 % cream    Sig: Apply topically 2 (two) times daily.    Dispense:  28.35 g    Refill:  0    Order Specific Question:   Supervising Provider    Answer:   JEGEDE, OLUGBEMIGA E [[id number]]      M , NP 

## 2020-05-09 DIAGNOSIS — R1084 Generalized abdominal pain: Secondary | ICD-10-CM | POA: Diagnosis not present

## 2020-05-09 DIAGNOSIS — R000 Tachycardia, unspecified: Secondary | ICD-10-CM | POA: Diagnosis not present

## 2020-05-09 DIAGNOSIS — R110 Nausea: Secondary | ICD-10-CM | POA: Diagnosis not present

## 2020-05-10 DIAGNOSIS — R1084 Generalized abdominal pain: Secondary | ICD-10-CM | POA: Diagnosis not present

## 2020-05-10 DIAGNOSIS — R000 Tachycardia, unspecified: Secondary | ICD-10-CM | POA: Diagnosis not present

## 2020-05-13 MED FILL — METOPROLOL SUCCINATE ER 100: 100 | 30 days supply | Qty: 30 | Fill #3

## 2020-05-13 MED FILL — TRUE METRIX TEST STRIP: 25 days supply | Qty: 100 | Fill #1

## 2020-05-13 MED FILL — OMEPRAZOLE DR 40 MG CAPSULE: 40 | 30 days supply | Qty: 30 | Fill #3

## 2020-05-13 NOTE — Telephone Encounter (Signed)
Please call her and see what is going on. She look fine the last time I saw her. Thanks

## 2020-05-14 ENCOUNTER — Other Ambulatory Visit: Payer: Self-pay | Admitting: Obstetrics and Gynecology

## 2020-05-14 ENCOUNTER — Ambulatory Visit (INDEPENDENT_AMBULATORY_CARE_PROVIDER_SITE_OTHER): Payer: Self-pay | Admitting: Obstetrics and Gynecology

## 2020-05-14 ENCOUNTER — Other Ambulatory Visit: Payer: Self-pay

## 2020-05-14 ENCOUNTER — Encounter: Payer: Self-pay | Admitting: Obstetrics and Gynecology

## 2020-05-14 VITALS — BP 173/72 | HR 94 | Wt 264.9 lb

## 2020-05-14 DIAGNOSIS — R109 Unspecified abdominal pain: Secondary | ICD-10-CM

## 2020-05-14 DIAGNOSIS — Z1331 Encounter for screening for depression: Secondary | ICD-10-CM

## 2020-05-14 DIAGNOSIS — N939 Abnormal uterine and vaginal bleeding, unspecified: Secondary | ICD-10-CM

## 2020-05-14 MED ORDER — HYDROMORPHONE HCL 2 MG PO TABS: 2.0000 mg | ORAL_TABLET | Freq: Four times a day (QID) | ORAL | 0 refills | Status: DC | PRN

## 2020-05-14 NOTE — Progress Notes (Signed)
Obstetrics and Gynecology New Patient Evaluation  Appointment Date: 05/14/2020  OBGYN Clinic: [facility] for [facility]  Primary Care Provider: Vevelyn Francois  Chief Complaint: follow up AUB  History of Present Illness: Jill Shaw is a 44 y.o. African-American P3 (No LMP recorded. Patient has had an ablation.), seen for the above chief complaint.  Patient is here for weekly follow up due to being on pain medications. Since last telephone note she went to [facility] on 7/25 for abdominal pain and CT scan showed globular adenomyosis uterus and liver spots and H/H stable with Hgb 9.8.  She states that she is taking the aygestin '5mg'$  qid and that she didn't have any bleeding for a week but started having a small amount yesterday. Her abdominal pain continues and the pain medications don't seem to help which is why she went to [facility]. She is aware that she missed the phone call for her 2nd opinion GYN appt at [facility] via the ER  Review of Systems: Pertinent items noted in HPI and remainder of comprehensive ROS otherwise negative.   Patient Active Problem List   Diagnosis Date Noted  . Abdominal pain 03/08/2020  . Chronic diastolic CHF (congestive heart failure) ([place]) 03/08/2020  . Acute encephalopathy 01/29/2020  . AKI (acute kidney injury) ([place]) 01/29/2020  . Symptomatic anemia 12/19/2019  . Elevated sed rate 11/26/2019  . Elevated C-reactive protein (CRP) 11/26/2019  . Hypoglycemia 02/07/2019  . Hypotension 02/07/2019  . Lactic acidosis 02/07/2019  . Right ankle pain 02/07/2019  . Chronic obstructive pulmonary disease ([place]) 02/05/2019  . Seizure ([place]) 07/31/2018  . Vitamin D deficiency 05/31/2017  . Gastroesophageal reflux disease 05/24/2017  . Abnormal uterine bleeding (AUB) 05/17/2017  . Anemia of chronic disease 04/06/2017  . Knee pain, chronic 03/27/2016  . Type 2 diabetes mellitus without complication, with long-term current use of insulin ([place]) 03/27/2016  .  Essential hypertension 03/27/2016  . Morbid obesity ([place]) 03/27/2016  . Irritable bowel syndrome with constipation 03/27/2016  . Tobacco dependence 03/27/2016  . CKD (chronic kidney disease) stage 2, GFR 60-89 ml/min 03/25/2016  . Arthropathy, lower leg 05/04/2013  . CTS (carpal tunnel syndrome) 05/04/2013  . Peripheral edema 05/04/2013  . Chronic pain 05/13/2012  . Diabetic gastroparesis ([place]) 11/06/2006  . Hot flashes 11/06/2006    Past Medical History:  Past Medical History:  Diagnosis Date  . Anemia   . Arthritis    knees, hands  . Asthma   . COPD (chronic obstructive pulmonary disease) ([place])   . Diabetes mellitus without complication ([employer])    type 2  . Dysfunctional uterine bleeding   . GERD (gastroesophageal reflux disease)   . Hypertension   . Neuromuscular disorder (HCC)    neuropathy feet  . Seizures ([place]) 09/12/2017   pt states r/t stress and blood sugar - no meds last one 4 months ago, not seen neurologist  . Sickle cell trait ([place])   . Smoker   . Vitamin D deficiency 10/2019  . Wears glasses     Past Surgical History:  Past Surgical History:  Procedure Laterality Date  . CESAREAN SECTION     x 1. for twins  . DILATION AND CURETTAGE OF UTERUS N/A 08/20/2019   Procedure: DILATATION AND CURETTAGE;  Surgeon: Emily Filbert, MD;  Location: [place];  Service: Gynecology;  Laterality: N/A;  . ENDOMETRIAL ABLATION N/A 08/20/2019   Procedure: [place] Ablation;  Surgeon: Emily Filbert, MD;  Location: [place];  Service: Gynecology;  Laterality: N/A;  . EYE SURGERY Bilateral    laser right and cataract removed left eye  . RADIOLOGY WITH ANESTHESIA N/A 09/16/2019   Procedure: MRI WITH ANESTHESIA   L SPINE WITHOUT CONTRAST, T SPINE WITHOUT CONTRAST , CERVICAL WITHOUT CONTRAST;  Surgeon: Radiologist, Medication, MD;  Location: [place];  Service: Radiology;  Laterality: N/A;  . TUBAL LIGATION     interval BTL  . UPPER GI ENDOSCOPY  07/2017    Past Obstetrical History:  OB  History  Gravida Para Term Preterm AB Living  3         3  SAB TAB Ectopic Multiple Live Births          3    # Outcome Date GA Lbr Len/2nd Weight Sex Delivery Anes PTL Lv  3 Gravida           2 Gravida           1 Gravida             Obstetric Comments  Vaginal x 1 and then c-section for twins   Past Gynecological History: As per HPI.  Social History:  Social History   Socioeconomic History  . Marital status: Legally Separated    Spouse name: Not on file  . Number of children: Not on file  . Years of education: Not on file  . Highest education level: Not on file  Occupational History  . Occupation: unemployed  Tobacco Use  . Smoking status: Current Every Day Smoker    Packs/day: 0.50    Years: 26.00    Pack years: 13.00    Types: Cigarettes  . Smokeless tobacco: Never Used  Vaping Use  . Vaping Use: Never used  Substance and Sexual Activity  . Alcohol use: No  . Drug use: No  . Sexual activity: Not Currently    Birth control/protection: None  Other Topics Concern  . Not on file  Social History Narrative  . Not on file   Social Determinants of Health   Financial Resource Strain:   . Difficulty of Paying Living Expenses:   Food Insecurity: No Food Insecurity  . Worried About Charity fundraiser in the Last Year: Never true  . Ran Out of Food in the Last Year: Never true  Transportation Needs: No Transportation Needs  . Lack of Transportation (Medical): No  . Lack of Transportation (Non-Medical): No  Physical Activity:   . Days of Exercise per Week:   . Minutes of Exercise per Session:   Stress:   . Feeling of Stress :   Social Connections:   . Frequency of Communication with Friends and Family:   . Frequency of Social Gatherings with Friends and Family:   . Attends Religious Services:   . Active Member of Clubs or Organizations:   . Attends Archivist Meetings:   Marland Kitchen Marital Status:   Intimate Partner Violence:   . Fear of Current or  Ex-Partner:   . Emotionally Abused:   Marland Kitchen Physically Abused:   . Sexually Abused:     Family History:  Family History  Problem Relation Age of Onset  . Diabetes Mother   . Hypertension Mother     Medications [name] had no medications administered during this visit. Current Outpatient Medications  Medication Sig Dispense Refill  . acetaminophen-codeine (TYLENOL #3) 300-30 MG tablet Take 1 tablet by mouth every 8 (eight) hours as needed for up to 8 doses for moderate pain. 8 tablet  0  . albuterol (PROVENTIL) (2.5 MG/3ML) 0.083% nebulizer solution Take 3 mLs (2.5 mg total) by nebulization every 6 (six) hours as needed for wheezing or shortness of breath. 150 mL 3  . albuterol (VENTOLIN HFA) 108 (90 Base) MCG/ACT inhaler Inhale 2 puffs into the lungs every 6 (six) hours as needed for wheezing or shortness of breath. 8 g 3  . blood glucose meter kit and supplies KIT Dispense based on patient and insurance preference. Use up to four times daily as directed. (FOR ICD-9 250.00, 250.01). (Patient taking differently: 1 each by Other route See admin instructions. Dispense based on patient and insurance preference. Use up to four times daily as directed. (FOR ICD-9 250.00, 250.01).) 1 each 0  . Blood Glucose Monitoring Suppl (TRUE METRIX METER) w/Device KIT 1 each by Does not apply route 4 (four) times daily -  before meals and at bedtime. 1 kit 0  . Blood Pressure Monitoring (BLOOD PRESSURE KIT) DEVI Check BP BID prn (Patient taking differently: 1 each by Other route 2 (two) times daily as needed (blood pressure). ) 1 each 0  . budesonide-formoterol (SYMBICORT) 160-4.5 MCG/ACT inhaler INHALE 2 PUFFS INTO THE LUNGS 2 (TWO) TIMES DAILY. (Patient taking differently: Inhale 2 puffs into the lungs 2 (two) times daily. ) 10.2 g 3  . Cholecalciferol (VITAMIN D-3) 125 MCG (5000 UT) TABS Take 1 tablet by mouth daily. (Patient taking differently: Take 5,000 Units by mouth daily. ) 90 tablet 3  .  Diclofenac Sodium (PENNSAID) 2 % SOLN Apply QID prn 112 g 6  . DULoxetine 40 MG CPEP Take 40 mg by mouth daily. 30 capsule 2  . ferrous sulfate 325 (65 FE) MG tablet Take 1 tablet (325 mg total) by mouth 3 (three) times daily with meals. 30 tablet 3  . fluticasone (FLONASE) 50 MCG/ACT nasal spray Place 2 sprays into both nostrils daily. 16 g 6  . furosemide (LASIX) 40 MG tablet Take 1 tablet (40 mg total) by mouth daily. 30 tablet 0  . Glucosamine Sulfate 1000 MG CAPS Take 1 capsule (1,000 mg total) by mouth 2 (two) times daily. 180 capsule 3  . glucose blood (TRUE METRIX BLOOD GLUCOSE TEST) test strip Use as instructed 100 each 12  . hydrocortisone 2.5 % cream Apply topically 2 (two) times daily. 30 g 2  . hydroquinone 4 % cream Apply topically 2 (two) times daily. 28.35 g 0  . Insulin Lispro Prot & Lispro (HUMALOG MIX 75/25 KWIKPEN) (75-25) 100 UNIT/ML Kwikpen INJECT 100 UNITS EVERY 12 HOURS (Patient taking differently: Inject 100 Units into the skin every 12 (twelve) hours. ) 15 mL 11  . Insulin Pen Needle (PEN NEEDLES) 30G X 5 MM MISC 1 Units by Does not apply route as directed. 100 each 11  . losartan (COZAAR) 50 MG tablet Take 1 tablet (50 mg total) by mouth daily. 30 tablet 2  . metoprolol succinate (TOPROL-XL) 100 MG 24 hr tablet Take 1 tablet (100 mg total) by mouth daily. Take with or immediately following a meal. 90 tablet 3  . omeprazole (PRILOSEC) 40 MG capsule Take 1 capsule (40 mg total) by mouth daily. 30 capsule 3  . oxyCODONE (ROXICODONE) 5 MG immediate release tablet Take 1-2 tablets (5-10 mg total) by mouth every 6 (six) hours as needed for severe pain. 30 tablet 0  . potassium chloride SA (KLOR-CON) 20 MEQ tablet Take 1 tablet (20 mEq total) by mouth daily. 30 tablet 3  . promethazine (PHENERGAN) 12.5 MG  tablet Take 1 tablet (12.5 mg total) by mouth every 6 (six) hours as needed for nausea or vomiting. 15 tablet 0  . saxagliptin HCl (ONGLYZA) 2.5 MG TABS tablet Take 1 tablet  (2.5 mg total) by mouth daily. 90 tablet 3  . saxagliptin HCl (ONGLYZA) 5 MG TABS tablet Take 1 tablet (5 mg total) by mouth daily. 90 tablet 0  . Tiotropium Bromide Monohydrate (SPIRIVA RESPIMAT) 2.5 MCG/ACT AERS Inhale 2 puffs into the lungs daily. 4 g 0  . tizanidine (ZANAFLEX) 6 MG capsule TAKE 1 CAPSULE (6 MG TOTAL) BY MOUTH 3 (THREE) TIMES DAILY. 90 capsule 0  . tranexamic acid (LYSTEDA) 650 MG TABS tablet Take 2 tablets (1,300 mg total) by mouth 3 (three) times daily. Take during menses for a maximum of five days 30 tablet 0  . TRUEplus Lancets 28G MISC 1 each by Other route in the morning, at noon, in the evening, and at bedtime.    . Turmeric 500 MG CAPS Take 500 mg by mouth 2 (two) times daily. 180 capsule 3  . Vitamin D, Ergocalciferol, (DRISDOL) 1.25 MG (50000 UNIT) CAPS capsule Take 1 capsule (50,000 Units total) by mouth every 7 (seven) days. (Patient taking differently: Take 50,000 Units by mouth every Friday. ) 5 capsule 6  . cetirizine (ZYRTEC) 10 MG tablet Take 1 tablet (10 mg total) by mouth daily. 30 tablet 3  . Lido-Capsaicin-Men-Methyl Sal 0.5-0.035-5-20 % PTCH Apply 1 patch topically daily. (Patient taking differently: Apply 1 patch topically daily as needed (pain). ) 30 patch 2   No current facility-administered medications for this visit.    Allergies Ketoprofen, Aspirin, Gabapentin, Ibuprofen, Liraglutide, Naproxen, Omeprazole-sodium bicarbonate, Sulfa antibiotics, and Tramadol   Physical Exam:  BP (!) 173/72   Pulse 94   Wt (!) 264 lb 14.4 oz (120.2 kg)   BMI 44.08 kg/m  Body mass index is 44.08 kg/m. General appearance: Well nourished, well developed female in no acute distress.  Respiratory:  Normal respiratory effort Neuro/Psych:  Normal mood and affect.  Skin:  Warm and dry.   Laboratory: as per HPI  Radiology:  Impression Performed by [facility]  1. Multifocal irregular and geographic areas of hypoattenuation within the liver, nonspecific however can  be seen with steatosis. Correlate with LFTs and prior outside imaging to document stability (no prior imaging available at this time). If not previously worked up or if interval change, recommend MRI.  2. Other findings as above. Narrative Performed by [facility] CT ABDOMEN PELVIS W CONTRAST (ROUTINE), 05/10/2020 3:36 AM   INDICATION: Nausea/vomiting \ Abdominal pain, acute, nonlocalized \ R10.84 Abdominal pain, acute, generalized   COMPARISON: None.   TECHNIQUE: Multislice axial images were obtained through the abdomen and pelvis with administration of iodinated intravenous contrast material. Multi-planar reformatted images were generated for additional analysis. Nongated technique limits cardiac detail.   All CT scans at [facility] and [place] are performed using dose optimization techniques as appropriate to a performed exam, including but not limited to one or more of the following: automated exposure control, adjustment of the mA and/or kV according to patient size, use of iterative reconstruction technique. In addition, [facility] is participating in the [place] program which will further assist [place] in optimizing patient radiation exposure.    FINDINGS:   LOWER CHEST:  . Mediastinum: Tiny hiatal hernia.  . Cardiovascular: Heart size within normal limits. Trace pericardial fluid and/or thickening.  . Lungs & pleura: Linear areas of atelectasis  and/or scarring within the lung bases. Detailed lung parenchymal assessment degraded by respiratory motion.   ABDOMEN & PELVIS:  . Liver: Multifocal linear and geographic regions of hypoattenuation throughout the liver.  . Gallbladder & biliary: No calcified gallstones. No biliary ductal dilation.  . Pancreas: Within normal limits.  . Spleen: Within normal limits.  . GI tract: No overt colitis. Small bowel loops are of normal caliber. Appendix within normal limits.   . Adrenals: Within normal  limits.  . Kidneys: Within normal limits.  . Ureters: Within normal limits.  . Bladder: Within normal limits.  . Reproductive system: Lobular uterine contour with underlying adenomyosis and/or fibroids questioned. No adnexal mass.   . Peritoneum/mesentery/extraperitoneum: No free intraperitoneal air or fluid. Tiny fat-containing umbilical hernia. No lymphadenopathy.  . Vascular: Normal caliber abdominal aorta.   MUSCULOSKELETAL:  . Small sclerotic area within the right iliac bone is nonspecific however without osseous expansion or cortical breakthrough. Mild polyarticular degenerative change. Procedure Note  Interface, Rad Results In - 05/10/2020 6:48 AM EDT  Formatting of this note might be different from the original.  CT ABDOMEN PELVIS W CONTRAST (ROUTINE), 05/10/2020 3:36 AM   INDICATION: Nausea/vomiting \ Abdominal pain, acute, nonlocalized \ R10.84 Abdominal pain, acute, generalized   COMPARISON: None.   TECHNIQUE: Multislice axial images were obtained through the abdomen and pelvis with administration of iodinated intravenous contrast material. Multi-planar reformatted images were generated for additional analysis. Nongated technique limits cardiac detail.   All CT scans at [facility] and [place] are performed using dose optimization techniques as appropriate to a performed exam, including but not limited to one or more of the following: automated exposure control, adjustment of the mA and/or kV according to patient size, use of iterative reconstruction technique. In addition, [facility] is participating in the [place] program which will further assist [place] in optimizing patient radiation exposure.    FINDINGS:   LOWER CHEST:  . Mediastinum: Tiny hiatal hernia.  . Cardiovascular: Heart size within normal limits. Trace pericardial fluid and/or thickening.  . Lungs & pleura: Linear areas of atelectasis and/or scarring within the lung  bases. Detailed lung parenchymal assessment degraded by respiratory motion.   ABDOMEN & PELVIS:  . Liver: Multifocal linear and geographic regions of hypoattenuation throughout the liver.  . Gallbladder & biliary: No calcified gallstones. No biliary ductal dilation.  . Pancreas: Within normal limits.  . Spleen: Within normal limits.  . GI tract: No overt colitis. Small bowel loops are of normal caliber. Appendix within normal limits.   . Adrenals: Within normal limits.  . Kidneys: Within normal limits.  . Ureters: Within normal limits.  . Bladder: Within normal limits.  . Reproductive system: Lobular uterine contour with underlying adenomyosis and/or fibroids questioned. No adnexal mass.   . Peritoneum/mesentery/extraperitoneum: No free intraperitoneal air or fluid. Tiny fat-containing umbilical hernia. No lymphadenopathy.  . Vascular: Normal caliber abdominal aorta.   MUSCULOSKELETAL:  . Small sclerotic area within the right iliac bone is nonspecific however without osseous expansion or cortical breakthrough. Mild polyarticular degenerative change.    CONCLUSION:   1. Multifocal irregular and geographic areas of hypoattenuation within the liver, nonspecific however can be seen with steatosis.Correlate with LFTs and prior outside imaging to document stability (no prior imaging available at this time). If not previously worked up or if interval change, recommend MRI.  2. Other findings as above. Specimen Collected: 05/10/20 4:36 AM Last Resulted: 05/10/20 6:46 AM  Received From: Viona Gilmore  [facility]   Assessment: pt stable  Plan:  Long d/w pt, again, re: plan of care and next course of action. I told her that I would stay on the aygestin at her current dosing of '5mg'$  qid for now and to come back next week when she is due for another depo provera injection (her 2nd). I told her that her bleeding is likely causing her pain which is likely due to the adenomyosis. I told  her that our options are exhausted and that I feel a hysterectomy is the next best course for her, even with the risks associated with it, due to her age and likely long time until menopause. I also told her that her being on pain medications is not fixing the problem and will do nothing for the bleeding.   She wants the 2nd opinion, which I told her I encourage to see what they have to say and to call them to set this up but that I would only feel comfortable refilling her pain medications today and maybe 2-3 more times because of the risks associated with it and I would want to see her every week before refilling it. I told her that more than this and I recommend referral to a chronic pain clinic which she does not want to do at this time because of lack of insurance coverage. I will try dilaudid '2mg'$  #30 this time to see if that gives better pain relief.   [place] database reviewed.   RTC 1wk for f/u visit and depo provera injection  Durene Romans MD Attending Center for [facility])

## 2020-05-14 NOTE — Addendum Note (Signed)
Addended by: Faythe Casa on: 05/14/2020 10:08 AM   Modules accepted: Orders

## 2020-05-19 ENCOUNTER — Other Ambulatory Visit: Payer: Self-pay | Admitting: Family Medicine

## 2020-05-19 MED FILL — LOSARTAN POTASSIUM 50 MG TA: 50 | 30 days supply | Qty: 30 | Fill #1

## 2020-05-19 MED FILL — tiZANidine HCL 6 MG CAPS: 6 | 30 days supply | Qty: 90 | Fill #0

## 2020-05-19 NOTE — BH Specialist Note (Deleted)
Integrated Behavioral Health via Telemedicine Video (Caregility) Visit  05/19/2020 Jill Shaw [id number]  Number of Integrated Behavioral Health visits: 1 Session Start time: 1:45***  Session End time: 2:45*** Total time: {IBH Total KGYJ:85631497}  Referring Provider: [place] Bing, MD Type of Visit: Video Patient/Family location: Home [facility] Provider location: [facility] for [facility]  All persons participating in visit: Patient *** and [name] Jill Shaw ***   Confirmed patient's address: Yes  Confirmed patient's phone number: Yes  Any changes to demographics: No   Confirmed patient's insurance: Yes  Any changes to patient's insurance: No   Discussed confidentiality: Yes ***  I connected with [name] by a video enabled telemedicine application (Caregility) and verified that I am speaking with the correct person using two identifiers.     I discussed the limitations of evaluation and management by telemedicine and the availability of in person appointments.  I discussed that the purpose of this visit is to provide behavioral health care while limiting exposure to the novel coronavirus.   Discussed there is a possibility of technology failure and discussed alternative modes of communication if that failure occurs.  I discussed that engaging in this virtual visit, they consent to the provision of behavioral healthcare and the services will be billed under their insurance.  Patient and/or legal guardian expressed understanding and consented to virtual visit: Yes   PRESENTING CONCERNS: Patient and/or family reports the following symptoms/concerns: *** Duration of problem: ***; Severity of problem: {Mild/Moderate/Severe:20260}  STRENGTHS (Protective Factors/Coping Skills): ***  GOALS ADDRESSED: Patient will: 1.  Reduce symptoms of: {IBH Symptoms:21014056}  2.  Increase knowledge and/or ability of: {IBH Patient Tools:21014057}   3.  Demonstrate ability to: {IBH Goals:21014053}  INTERVENTIONS: Interventions utilized:  {IBH Interventions:21014054} Standardized Assessments completed: {IBH Screening Tools:21014051}  ASSESSMENT: Patient currently experiencing ***.   Patient may benefit from psychoeducation and brief therapeutic interventions regarding coping with symptoms of *** .  PLAN: 1. Follow up with behavioral health clinician on : *** 2. Behavioral recommendations:  -*** -*** 3. Referral(s): {IBH Referrals:21014055}  I discussed the assessment and treatment plan with the patient and/or parent/guardian. They were provided an opportunity to ask questions and all were answered. They agreed with the plan and demonstrated an understanding of the instructions.   They were advised to call back or seek an in-person evaluation if the symptoms worsen or if the condition fails to improve as anticipated.  Valetta Close Mahesh Sizemore

## 2020-05-20 ENCOUNTER — Encounter: Payer: Self-pay | Admitting: Physical Therapy

## 2020-05-20 ENCOUNTER — Ambulatory Visit: Payer: Medicaid Other | Attending: Family Medicine | Admitting: Physical Therapy

## 2020-05-20 ENCOUNTER — Other Ambulatory Visit: Payer: Self-pay

## 2020-05-20 DIAGNOSIS — G8929 Other chronic pain: Secondary | ICD-10-CM | POA: Diagnosis present

## 2020-05-20 DIAGNOSIS — M25561 Pain in right knee: Secondary | ICD-10-CM | POA: Diagnosis not present

## 2020-05-20 DIAGNOSIS — M6281 Muscle weakness (generalized): Secondary | ICD-10-CM | POA: Diagnosis present

## 2020-05-20 NOTE — Therapy (Signed)
[facility] [address] [place], [place], [address] Phone: [phone]   Fax:  [phone]  Physical Therapy Evaluation  Patient Details  Name: Jill Shaw MRN: [id number] Date of Birth: December 09, 1975 Referring Provider (PT): Dr. Marjory Sneddon Hilts    Encounter Date: 05/20/2020   PT End of Session - 05/20/20 1648    Visit Number 1    Number of Visits 6    PT Start Time 1546    PT Stop Time 1628    PT Time Calculation (min) 42 min    Activity Tolerance Patient limited by pain;Patient tolerated treatment well    Behavior During Therapy Restless   lethragic, difficulty keeping eyes open          Past Medical History:  Diagnosis Date  . Anemia   . Arthritis    knees, hands  . Asthma   . COPD (chronic obstructive pulmonary disease) (HCC)   . Diabetes mellitus without complication (HCC)    type 2  . Dysfunctional uterine bleeding   . GERD (gastroesophageal reflux disease)   . Hypertension   . Neuromuscular disorder (HCC)    neuropathy feet  . Seizures (HCC) 09/12/2017   pt states r/t stress and blood sugar - no meds last one 4 months ago, not seen neurologist  . Sickle cell trait (HCC)   . Smoker   . Vitamin D deficiency 10/2019  . Wears glasses     Past Surgical History:  Procedure Laterality Date  . CESAREAN SECTION     x 1. for twins  . DILATION AND CURETTAGE OF UTERUS N/A 08/20/2019   Procedure: DILATATION AND CURETTAGE;  Surgeon: Allie Bossier, MD;  Location: [facility] OR;  Service: Gynecology;  Laterality: N/A;  . ENDOMETRIAL ABLATION N/A 08/20/2019   Procedure: Minerva Ablation;  Surgeon: Allie Bossier, MD;  Location: [facility] OR;  Service: Gynecology;  Laterality: N/A;  . EYE SURGERY Bilateral    laser right and cataract removed left eye  . RADIOLOGY WITH ANESTHESIA N/A 09/16/2019   Procedure: MRI WITH ANESTHESIA   L SPINE WITHOUT CONTRAST, T SPINE WITHOUT CONTRAST , CERVICAL WITHOUT CONTRAST;  Surgeon: Radiologist, Medication,  MD;  Location: [facility] OR;  Service: Radiology;  Laterality: N/A;  . TUBAL LIGATION     interval BTL  . UPPER GI ENDOSCOPY  07/2017    There were no vitals filed for this visit.    Subjective Assessment - 05/20/20 1549    Subjective Pt is a 44 y.o. female c/o R knee pain, soreness, weakness an dconstant giving out. Pt states pain began about 2018, no MOI. Pain has continued to worsen. She states her knee is giving out and she has been falling lately. She uses a cane or walker occasionally at home.    Pertinent History hypertension, COPD, diabetes    Limitations Lifting;Standing;Walking;House hold activities    How long can you stand comfortably? 5 minutes    How long can you walk comfortably? 5-10 minutes    Diagnostic tests MRI in 2020    Patient Stated Goals get leg stronger, walk through the mall    Currently in Pain? Yes    Pain Score 8     Pain Location Knee    Pain Orientation Right    Pain Descriptors / Indicators Aching;Dull;Throbbing    Pain Type Chronic pain    Pain Radiating Towards down into the right foot   burning, throbbing, needle sticking   Pain Onset More than a month  ago    Pain Frequency Intermittent    Aggravating Factors  standing, walking    Pain Relieving Factors meds, soaking in hot water              [facility] PT Assessment - 05/20/20 0001      Assessment   Medical Diagnosis R Knee Pain     Referring Provider (PT) Dr. Marjory Sneddon Hilts     Onset Date/Surgical Date --   pt stated it started in 2018   Hand Dominance Right    Next MD Visit nothing scheduled    Prior Therapy no      Precautions   Precautions None      Restrictions   Weight Bearing Restrictions No      Balance Screen   Has the patient fallen in the past 6 months Yes    How many times? 5      Home Environment   Additional Comments 14 stairs going into house       Prior Function   Level of Independence Independent    Vocation Unemployed    Leisure going to the mall      Cognition    Overall Cognitive Status Within Functional Limits for tasks assessed    Attention Focused    Focused Attention Appears intact    Memory Appears intact    Awareness Appears intact    Problem Solving Appears intact      Sensation   Light Touch Appears Intact    Proprioception Appears Intact      Coordination   Gross Motor Movements are Fluid and Coordinated Yes    Fine Motor Movements are Fluid and Coordinated Yes      Functional Tests   Functional tests Squat;Sit to Stand      Posture/Postural Control   Posture Comments rounded shoulders      ROM / Strength   AROM / PROM / Strength AROM;PROM;Strength      AROM   AROM Assessment Site Knee    Right/Left Knee Right    Right Knee Extension -6    Right Knee Flexion 106      PROM   PROM Assessment Site Knee    Right/Left Knee Right      Strength   Strength Assessment Site Hip;Knee    Right/Left Hip Right    Right Hip Flexion 4-/5    Right Hip ABduction 4-/5    Right Hip ADduction 4/5    Right/Left Knee Right    Right Knee Flexion 4-/5    Right Knee Extension 4/5      Palpation   Patella mobility decreased patellar movement medial/lateral and superior/inferior       Special Tests    Special Tests Knee Special Tests      Ambulation/Gait   Gait Comments lateral sway during gait                      Objective measurements completed on examination: See above findings.       [facility] Adult PT Treatment/Exercise - 05/20/20 0001      Self-Care   Self-Care Other Self-Care Comments    Other Self-Care Comments  Pt asked about getting prescription for knee brace and pillow. Discussed working on strengthening LE before getting a knee brace.      Exercises   Exercises Knee/Hip      Knee/Hip Exercises: Stretches   Other Knee/Hip Stretches knee extension hold 2x20 sec  Knee/Hip Exercises: Supine   Quad Sets 2 sets;10 reps;Strengthening;Right    Other Supine Knee/Hip Exercises Clamshells x10 reps    red  band                 PT Education - 05/20/20 1647    Education Details HEP, benefits of strengthening    Person(s) Educated Patient    Methods Explanation;Demonstration;Tactile cues;Verbal cues;Handout    Comprehension Tactile cues required;Verbal cues required;Returned demonstration;Verbalized understanding            PT Short Term Goals - 05/20/20 1650      PT SHORT TERM GOAL #1   Title Pt will improve R knee ROM to 0 degrees extension.    Time 3    Period Weeks    Status New    Target Date 06/10/20      PT SHORT TERM GOAL #2   Title Pt will improve R knee strength to 5/5 in all directions.    Time 3    Period Weeks    Status New    Target Date 06/10/20      PT SHORT TERM GOAL #3   Title Pt will be independent with HEP to continue strengthening on her own at home.    Time 3    Period Weeks    Status New    Target Date 06/10/20             PT Long Term Goals - 05/20/20 1652      PT LONG TERM GOAL #1   Title Pt will be able to stand for 30 minutes with no reports of pain to be able to carry out IADLs.    Time 6    Period Weeks    Status New    Target Date 07/01/20      PT LONG TERM GOAL #2   Title Pt will be able to ascend and descend 14 stairs to her house with proper mechanics and no reports of pain or knee buckling.    Time 6    Period Weeks    Status New    Target Date 07/01/20      PT LONG TERM GOAL #3   Title Pt will be able to walk 1000' with no reports of knee pain to be able to go to the mall.    Time 6    Period Weeks    Status New    Target Date 07/01/20                  Plan - 05/20/20 1626    Clinical Impression Statement Pt is a 44 y.o. female presenting with L knee pain. She reported pain was diffuse throughout her whole knee. Pt is sightly limited in knee extension ROM and had 106 degrees knee flexion. She denied being on any pain medications during the session. Pt exhibits decreased L hip and knee strength with mild  pain during MMT. During evaluation pt had difficulty keeping her eyes open and completing sentences at times, also exhibiting signs of lethargy. She asked about getting a prescription for a knee brace and specific pillows for her knee. She specified not wanting to get a knee brace over-the-counter, but wanted a prescription. Pt was refered to a pain clinic, but has not gotten in touch with them yet. Pt would benefit from skilled therapy to improve RLE strength and stability to decrease pain and falls.    Personal Factors and Comorbidities Comorbidity 3+;Fitness;Behavior Pattern  Comorbidities asthma, COPD, hypertension    Examination-Activity Limitations Stand;Stairs;Squat;Bend    Examination-Participation Restrictions Community Activity    Stability/Clinical Decision Making Evolving/Moderate complexity    Clinical Decision Making Moderate    Rehab Potential Fair    PT Frequency 1x / week    PT Duration 4 weeks    PT Treatment/Interventions ADLs/Self Care Home Management;DME Instruction;Ultrasound;Traction;Moist Heat;Iontophoresis 4mg /ml Dexamethasone;Gait training;Stair training;Functional mobility training;Therapeutic activities;Therapeutic exercise;Balance training;Neuromuscular re-education;Manual techniques;Patient/family education;Passive range of motion;Dry needling;Joint Manipulations;Spinal Manipulations;Vasopneumatic Device;Taping    PT Home Exercise Plan [id number]           Patient will benefit from skilled therapeutic intervention in order to improve the following deficits and impairments:  Abnormal gait, Decreased range of motion, Difficulty walking, Pain, Decreased activity tolerance, Improper body mechanics, Decreased mobility, Decreased strength  Visit Diagnosis: Chronic pain of right knee  Muscle weakness (generalized)     Problem List Patient Active Problem List   Diagnosis Date Noted  . Abdominal pain 03/08/2020  . Chronic diastolic CHF (congestive heart failure)  (HCC) 03/08/2020  . Acute encephalopathy 01/29/2020  . AKI (acute kidney injury) (HCC) 01/29/2020  . Symptomatic anemia 12/19/2019  . Elevated sed rate 11/26/2019  . Elevated C-reactive protein (CRP) 11/26/2019  . Hypoglycemia 02/07/2019  . Hypotension 02/07/2019  . Lactic acidosis 02/07/2019  . Right ankle pain 02/07/2019  . Chronic obstructive pulmonary disease (HCC) 02/05/2019  . Seizure (HCC) 07/31/2018  . Vitamin D deficiency 05/31/2017  . Gastroesophageal reflux disease 05/24/2017  . Abnormal uterine bleeding (AUB) 05/17/2017  . Anemia of chronic disease 04/06/2017  . Knee pain, chronic 03/27/2016  . Type 2 diabetes mellitus without complication, with long-term current use of insulin (HCC) 03/27/2016  . Essential hypertension 03/27/2016  . Morbid obesity (HCC) 03/27/2016  . Irritable bowel syndrome with constipation 03/27/2016  . Tobacco dependence 03/27/2016  . CKD (chronic kidney disease) stage 2, GFR 60-89 ml/min 03/25/2016  . Arthropathy, lower leg 05/04/2013  . CTS (carpal tunnel syndrome) 05/04/2013  . Peripheral edema 05/04/2013  . Chronic pain 05/13/2012  . Diabetic gastroparesis (HCC) 11/06/2006  . Hot flashes 11/06/2006    Lowell BoutonKayla Monserratt Knezevic SPT 05/20/2020, 5:05 PM  [facility] [address] [place], [place], [address] Phone: [phone]   Fax:  [phone]  Name: Dierdre HighmanJulie M Heyne MRN: [id number] Date of Birth: April 22, 1976

## 2020-05-21 ENCOUNTER — Telehealth: Payer: Self-pay | Admitting: *Deleted

## 2020-05-21 ENCOUNTER — Ambulatory Visit: Payer: Self-pay | Admitting: Obstetrics and Gynecology

## 2020-05-21 ENCOUNTER — Encounter: Payer: Self-pay | Admitting: Obstetrics and Gynecology

## 2020-05-21 NOTE — Telephone Encounter (Signed)
Pt requested letter with the surgical procedure she is to have.

## 2020-05-23 NOTE — Progress Notes (Signed)
Patient did not keep her GYN appointment for 05/21/2020.  Cornelia Copa MD Attending Center for [employer] [facility])

## 2020-05-24 MED FILL — NORETHINDRONE 5 MG TABLET: 5 | 15 days supply | Qty: 90 | Fill #1

## 2020-05-26 ENCOUNTER — Telehealth (INDEPENDENT_AMBULATORY_CARE_PROVIDER_SITE_OTHER): Payer: Self-pay

## 2020-05-26 ENCOUNTER — Ambulatory Visit (INDEPENDENT_AMBULATORY_CARE_PROVIDER_SITE_OTHER): Payer: Self-pay

## 2020-05-26 ENCOUNTER — Other Ambulatory Visit: Payer: Self-pay

## 2020-05-26 ENCOUNTER — Ambulatory Visit: Payer: No Typology Code available for payment source | Admitting: Nurse Practitioner

## 2020-05-26 VITALS — BP 137/69 | HR 79 | Wt 270.8 lb

## 2020-05-26 DIAGNOSIS — N939 Abnormal uterine and vaginal bleeding, unspecified: Secondary | ICD-10-CM

## 2020-05-26 DIAGNOSIS — Z3042 Encounter for surveillance of injectable contraceptive: Secondary | ICD-10-CM

## 2020-05-26 MED ORDER — NORETHINDRONE ACETATE 5 MG PO TABS: 10.0000 mg | ORAL_TABLET | Freq: Four times a day (QID) | ORAL | 1 refills | Status: DC

## 2020-05-26 MED ORDER — MEDROXYPROGESTERONE ACETATE 150 MG/ML IM SUSP
150.0000 mg | Freq: Once | INTRAMUSCULAR | Status: AC
  Administered 2020-05-26: 150 mg via INTRAMUSCULAR

## 2020-05-26 NOTE — Patient Instructions (Signed)
[place]:  Call our 24-hour HelpLine at 862-193-2011 or [phone])[phone] for immediate assistance for mental health.  Additional Resources: [employer]: 1-800-SUICIDE [employer] Suicide Prevention Lifeline: [employer]

## 2020-05-26 NOTE — Progress Notes (Signed)
Jill Shaw here for Depo-Provera  Injection.  Injection administered without complication. Patient will return in 3 months for next injection.  Patient reports continued vaginal bleeding. States she is taking 9 5 mg tablets of Aygestin daily. Reviewed with [name], MD who gives verbal order for Aygestin 10 mg QID. Pt to follow up with provider within the month.   PHQ-9 and GAD-7 positive; pt reports SI 4 days out of the week, no plan for harm at this time. [facility] [name] is unavailable at this time. Pt cancelled appt with [name] for today, states "I do not want to get into it today." Rescheduled visit for 06/16/20. Pt is tearful. Pt states she does not want to wait to see [name] today. [name], MD to bedside to speak with pt. Pt given phone numbers for mental health crisis. Patient verbally agrees to follow up with Dr. [name] on 06/11/20 and to seek help before if needed.  Marjo Bicker, RN 05/26/2020  8:41 AM

## 2020-05-26 NOTE — Telephone Encounter (Signed)
Pt called and left VM on nurse line requesting a call from clinical staff. Called pt back. Pt requests provider notes be faxed to [facility] case manager Herschell Dimes at [phone]. Pt would like all of [place], MD notes from 04/23/20 forward.   Pt needs to be called to notify her to come into office to sign ROI.

## 2020-05-27 NOTE — Telephone Encounter (Signed)
Called patient to inform her she needs to come to office to sign a records release. She did not answer. LM that I was sending a My Chart Message.

## 2020-05-28 MED FILL — ?HUMALOG MIX 75-25 KWIKPEN: (75-25) 100 | 21 days supply | Qty: 45 | Fill #3

## 2020-05-30 NOTE — Progress Notes (Signed)
Patient was assessed and managed by nursing staff during this encounter. I have reviewed the chart and agree with the documentation and plan. I have also made any necessary editorial changes. Patient contracts for safety  Marathon Bing, MD 05/30/2020 3:17 PM

## 2020-05-31 ENCOUNTER — Ambulatory Visit: Payer: Self-pay | Admitting: Nurse Practitioner

## 2020-05-31 ENCOUNTER — Other Ambulatory Visit: Payer: Self-pay

## 2020-05-31 ENCOUNTER — Ambulatory Visit: Payer: Self-pay

## 2020-05-31 ENCOUNTER — Ambulatory Visit: Payer: No Typology Code available for payment source | Admitting: Podiatry

## 2020-05-31 DIAGNOSIS — M7661 Achilles tendinitis, right leg: Secondary | ICD-10-CM

## 2020-05-31 DIAGNOSIS — M722 Plantar fascial fibromatosis: Secondary | ICD-10-CM

## 2020-05-31 NOTE — Progress Notes (Signed)
Subjective: 44 y.o. female presenting today for evaluation of right heel pain both plantar and posterior heel.  The patient had chronic heel pain for greater than 1 year with minimal relief despite multiple conservative treatment modalities.  She is very frustrated and last visit on 04/21/2020 we did discuss possible surgical intervention at this time.  She has been considering it and would like to discuss the surgical treatment and possibly move forward.  She presents for further treatment evaluation  Past Medical History:  Diagnosis Date  . Anemia   . Arthritis    knees, hands  . Asthma   . COPD (chronic obstructive pulmonary disease) (HCC)   . Diabetes mellitus without complication (HCC)    type 2  . Dysfunctional uterine bleeding   . GERD (gastroesophageal reflux disease)   . Hypertension   . Neuromuscular disorder (HCC)    neuropathy feet  . Seizures (HCC) 09/12/2017   pt states r/t stress and blood sugar - no meds last one 4 months ago, not seen neurologist  . Sickle cell trait (HCC)   . Smoker   . Vitamin D deficiency 10/2019  . Wears glasses      Objective: Physical Exam General: The patient is alert and oriented x3 in no acute distress.  Dermatology: Skin is warm, dry and supple bilateral lower extremities. Negative for open lesions or macerations bilateral.   Vascular: Dorsalis Pedis and Posterior Tibial pulses palpable bilateral.  Capillary fill time is immediate to all digits.  Neurological: Epicritic and protective threshold intact bilateral.   Musculoskeletal: Tenderness to palpation to the plantar aspect of the right heel along the plantar fascia.  There is also tenderness to palpation along the insertion of the Achilles tendon right lower extremity.  All other joints range of motion within normal limits bilateral. Strength 5/5 in all groups bilateral.   MRI impression 08/01/2017: 1. 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 2. Generalized soft tissue edema about the ankle and included foot. 3. No tendinopathy or acute ligamentous pathology crossing the ankle joint. 4. Small plantar calcaneal enthesophyte.  Assessment: 1. Plantar fasciitis right - chronic 2.  Achilles tendinitis right -chronic  Plan of Care:  1. Patient evaluated.    2.  At this point we have tried multiple conservative modalities with minimal relief.  I do believe the patient would benefit from surgical intervention.  The patient is very concerned about healing and the extent of surgery.  Although the patient does have tarsal coalition I do believe she would benefit greatly from endoscopic plantar fasciotomy with tendo Achilles lengthening to alleviate pressure from the Achilles tendon and the plantar fascia.  Potentially this could alleviate all of her heel pain. 3. Today we discussed the conservative versus surgical management of the presenting pathology. The patient opts for surgical management. All possible complications and details of the procedure were explained. All patient questions were answered. No guarantees were expressed or implied. 4. Authorization for surgery was initiated today. Surgery will consist of endoscopic plantar fasciotomy right.  Tendo Achilles lengthening right. 5.  Return to clinic 1 week.    Felecia Shelling, DPM [facility]  Dr. Felecia Shelling, DPM    [address]  [place], [place] [address]                Office [phone]  Fax [phone]

## 2020-06-02 ENCOUNTER — Other Ambulatory Visit: Payer: Self-pay

## 2020-06-02 ENCOUNTER — Encounter: Payer: Self-pay | Admitting: Nurse Practitioner

## 2020-06-02 ENCOUNTER — Ambulatory Visit (INDEPENDENT_AMBULATORY_CARE_PROVIDER_SITE_OTHER): Payer: Self-pay | Admitting: Nurse Practitioner

## 2020-06-02 VITALS — BP 140/61 | HR 86 | Temp 98.4°F | Ht 65.0 in | Wt 277.0 lb

## 2020-06-02 DIAGNOSIS — J449 Chronic obstructive pulmonary disease, unspecified: Secondary | ICD-10-CM

## 2020-06-02 DIAGNOSIS — E119 Type 2 diabetes mellitus without complications: Secondary | ICD-10-CM | POA: Diagnosis not present

## 2020-06-02 DIAGNOSIS — IMO0002 Reserved for concepts with insufficient information to code with codable children: Secondary | ICD-10-CM

## 2020-06-02 DIAGNOSIS — Z794 Long term (current) use of insulin: Secondary | ICD-10-CM | POA: Diagnosis not present

## 2020-06-02 DIAGNOSIS — E1165 Type 2 diabetes mellitus with hyperglycemia: Secondary | ICD-10-CM

## 2020-06-02 DIAGNOSIS — D72820 Lymphocytosis (symptomatic): Secondary | ICD-10-CM

## 2020-06-02 DIAGNOSIS — E1122 Type 2 diabetes mellitus with diabetic chronic kidney disease: Secondary | ICD-10-CM

## 2020-06-02 DIAGNOSIS — I10 Essential (primary) hypertension: Secondary | ICD-10-CM

## 2020-06-02 LAB — POCT URINALYSIS DIP (CLINITEK)
Bilirubin, UA: NEGATIVE
Blood, UA: NEGATIVE
Glucose, UA: NEGATIVE mg/dL
Ketones, POC UA: NEGATIVE mg/dL
Leukocytes, UA: NEGATIVE
Nitrite, UA: NEGATIVE
Spec Grav, UA: 1.015 (ref 1.010–1.025)
Urobilinogen, UA: 4 E.U./dL — AB
pH, UA: 6 (ref 5.0–8.0)

## 2020-06-02 LAB — POCT GLYCOSYLATED HEMOGLOBIN (HGB A1C)
HbA1c POC (<> result, manual entry): 9 % (ref 4.0–5.6)
HbA1c, POC (controlled diabetic range): 9 % — AB (ref 0.0–7.0)
HbA1c, POC (prediabetic range): 9 % — AB (ref 5.7–6.4)
Hemoglobin A1C: 9 % — AB (ref 4.0–5.6)

## 2020-06-02 NOTE — Patient Instructions (Signed)
Edema  Edema is when you have too much fluid in your body or under your skin. Edema may make your legs, feet, and ankles swell up. Swelling is also common in looser tissues, like around your eyes. This is a common condition. It gets more common as you get older. There are many possible causes of edema. Eating too much salt (sodium) and being on your feet or sitting for a long time can cause edema in your legs, feet, and ankles. Hot weather may make edema worse. Edema is usually painless. Your skin may look swollen or shiny. Follow these instructions at home:  Keep the swollen body part raised (elevated) above the level of your heart when you are sitting or lying down.  Do not sit still or stand for a long time.  Do not wear tight clothes. Do not wear garters on your upper legs.  Exercise your legs. This can help the swelling go down.  Wear elastic bandages or support stockings as told by your doctor.  Eat a low-salt (low-sodium) diet to reduce fluid as told by your doctor.  Depending on the cause of your swelling, you may need to limit how much fluid you drink (fluid restriction).  Take over-the-counter and prescription medicines only as told by your doctor. Contact a doctor if:  Treatment is not working.  You have heart, liver, or kidney disease and have symptoms of edema.  You have sudden and unexplained weight gain. Get help right away if:  You have shortness of breath or chest pain.  You cannot breathe when you lie down.  You have pain, redness, or warmth in the swollen areas.  You have heart, liver, or kidney disease and get edema all of a sudden.  You have a fever and your symptoms get worse all of a sudden. Summary  Edema is when you have too much fluid in your body or under your skin.  Edema may make your legs, feet, and ankles swell up. Swelling is also common in looser tissues, like around your eyes.  Raise (elevate) the swollen body part above the level of your  heart when you are sitting or lying down.  Follow your doctor's instructions about diet and how much fluid you can drink (fluid restriction). This information is not intended to replace advice given to you by your health care provider. Make sure you discuss any questions you have with your health care provider. Document Revised: 10/05/2017 Document Reviewed: 10/20/2016 Elsevier Patient Education  2020 [employer]. Diabetes Mellitus and Nutrition, Adult When you have diabetes (diabetes mellitus), it is very important to have healthy eating habits because your blood sugar (glucose) levels are greatly affected by what you eat and drink. Eating healthy foods in the appropriate amounts, at about the same times every day, can help you:  Control your blood glucose.  Lower your risk of heart disease.  Improve your blood pressure.  Reach or maintain a healthy weight. Every person with diabetes is different, and each person has different needs for a meal plan. Your health care provider may recommend that you work with a diet and nutrition specialist (dietitian) to make a meal plan that is best for you. Your meal plan may vary depending on factors such as:  The calories you need.  The medicines you take.  Your weight.  Your blood glucose, blood pressure, and cholesterol levels.  Your activity level.  Other health conditions you have, such as heart or kidney disease. How do carbohydrates affect  me? Carbohydrates, also called carbs, affect your blood glucose level more than any other type of food. Eating carbs naturally raises the amount of glucose in your blood. Carb counting is a method for keeping track of how many carbs you eat. Counting carbs is important to keep your blood glucose at a healthy level, especially if you use insulin or take certain oral diabetes medicines. It is important to know how many carbs you can safely have in each meal. This is different for every person. Your  dietitian can help you calculate how many carbs you should have at each meal and for each snack. Foods that contain carbs include:  Bread, cereal, rice, pasta, and crackers.  Potatoes and corn.  Peas, beans, and lentils.  Milk and yogurt.  Fruit and juice.  Desserts, such as cakes, cookies, ice cream, and candy. How does alcohol affect me? Alcohol can cause a sudden decrease in blood glucose (hypoglycemia), especially if you use insulin or take certain oral diabetes medicines. Hypoglycemia can be a life-threatening condition. Symptoms of hypoglycemia (sleepiness, dizziness, and confusion) are similar to symptoms of having too much alcohol. If your health care provider says that alcohol is safe for you, follow these guidelines:  Limit alcohol intake to no more than 1 drink per day for nonpregnant women and 2 drinks per day for men. One drink equals 12 oz of beer, 5 oz of wine, or 1 oz of hard liquor.  Do not drink on an empty stomach.  Keep yourself hydrated with water, diet soda, or unsweetened iced tea.  Keep in mind that regular soda, juice, and other mixers may contain a lot of sugar and must be counted as carbs. What are tips for following this plan?  Reading food labels  Start by checking the serving size on the "Nutrition Facts" label of packaged foods and drinks. The amount of calories, carbs, fats, and other nutrients listed on the label is based on one serving of the item. Many items contain more than one serving per package.  Check the total grams (g) of carbs in one serving. You can calculate the number of servings of carbs in one serving by dividing the total carbs by 15. For example, if a food has 30 g of total carbs, it would be equal to 2 servings of carbs.  Check the number of grams (g) of saturated and trans fats in one serving. Choose foods that have low or no amount of these fats.  Check the number of milligrams (mg) of salt (sodium) in one serving. Most people  should limit total sodium intake to less than 2,300 mg per day.  Always check the nutrition information of foods labeled as "low-fat" or "nonfat". These foods may be higher in added sugar or refined carbs and should be avoided.  Talk to your dietitian to identify your daily goals for nutrients listed on the label. Shopping  Avoid buying canned, premade, or processed foods. These foods tend to be high in fat, sodium, and added sugar.  Shop around the outside edge of the grocery store. This includes fresh fruits and vegetables, bulk grains, fresh meats, and fresh dairy. Cooking  Use low-heat cooking methods, such as baking, instead of high-heat cooking methods like deep frying.  Cook using healthy oils, such as olive, canola, or sunflower oil.  Avoid cooking with butter, cream, or high-fat meats. Meal planning  Eat meals and snacks regularly, preferably at the same times every day. Avoid going long periods of time  without eating.  Eat foods high in fiber, such as fresh fruits, vegetables, beans, and whole grains. Talk to your dietitian about how many servings of carbs you can eat at each meal.  Eat 4-6 ounces (oz) of lean protein each day, such as lean meat, chicken, fish, eggs, or tofu. One oz of lean protein is equal to: ? 1 oz of meat, chicken, or fish. ? 1 egg. ?  cup of tofu.  Eat some foods each day that contain healthy fats, such as avocado, nuts, seeds, and fish. Lifestyle  Check your blood glucose regularly.  Exercise regularly as told by your health care provider. This may include: ? 150 minutes of moderate-intensity or vigorous-intensity exercise each week. This could be brisk walking, biking, or water aerobics. ? Stretching and doing strength exercises, such as yoga or weightlifting, at least 2 times a week.  Take medicines as told by your health care provider.  Do not use any products that contain nicotine or tobacco, such as cigarettes and e-cigarettes. If you need  help quitting, ask your health care provider.  Work with a Veterinary surgeon or diabetes educator to identify strategies to manage stress and any emotional and social challenges. Questions to ask a health care provider  Do I need to meet with a diabetes educator?  Do I need to meet with a dietitian?  What number can I call if I have questions?  When are the best times to check my blood glucose? Where to find more information:  American Diabetes Association: diabetes.org  Academy of Nutrition and Dietetics: www.eatright.[employer] of Diabetes and Digestive and Kidney Diseases (NIH): [employer].tn Summary  A healthy meal plan will help you control your blood glucose and maintain a healthy lifestyle.  Working with a diet and nutrition specialist (dietitian) can help you make a meal plan that is best for you.  Keep in mind that carbohydrates (carbs) and alcohol have immediate effects on your blood glucose levels. It is important to count carbs and to use alcohol carefully. This information is not intended to replace advice given to you by your health care provider. Make sure you discuss any questions you have with your health care provider. Document Revised: 09/14/2017 Document Reviewed: 11/06/2016 Elsevier Patient Education  2020 [employer].

## 2020-06-02 NOTE — Progress Notes (Signed)
[facility] [place], [place]  [address] Phone:  [phone]   Fax:  [phone]   Established Patient Office Visit  Subjective:  Patient ID: Jill Shaw, female    DOB: 03-19-1976  Age: 44 y.o. MRN: [id number]  CC:  Chief Complaint  Patient presents with  . Follow-up    HPI Jill Shaw presents for follow up. She  has a past medical history of Anemia, Arthritis, Asthma, COPD (chronic obstructive pulmonary disease) ([place]), Diabetes mellitus without complication ([place]), Dysfunctional uterine bleeding, GERD (gastroesophageal reflux disease), Hypertension, Neuromuscular disorder ([place]), Seizures ([place]) (09/12/2017), Sickle cell trait ([place]), Smoker, Vitamin D deficiency (10/2019), and Wears glasses.   Edema Patient complains of edema in both lower legs. The edema has been severe. Onset of symptoms was 1 day ago, and patient reports symptoms have gradually improved since that time. The edema is present all day. The patient states the problem is long-standing. The swelling has been aggravated by dependency of involved area and flying or long car trips. The swelling has been relieved by elevation of involved area.  She has not been compliant with her diuretic because of the urinary frequency associated.  She admits that she will take her medicine as soon as she gets home.  Associated factors include: nothing. Cardiac risk factors include diabetes mellitus, hypertension, obesity (BMI >= 30 kg/m2), sedentary lifestyle and smoking/ tobacco exposure.  Asthma Patient presents for evaluation of dyspnea, non-productive cough and wheezing. The patient has been previously diagnosed with asthma. Symptoms currently include dyspnea, non-productive cough and wheezing and occur daily. Observed precipitants include: occupational exposure. Current limitations in activity from asthma: none. Does she do nebulizer treatments? yes Does she use an inhaler? yes D  Diabetes  [place] Patient presents for follow up of diabetes. Current symptoms include: hyperglycemia, paresthesia of the feet, polyuria and visual disturbances. Symptoms have stabilized. Patient denies foot ulcerations, nausea, vomiting and weight loss. Evaluation to date has included: fasting blood sugar, fasting lipid panel, hemoglobin A1C and microalbuminuria.  Home sugars: Varies. Current treatment: Continued insulin which has been effective, Continued ACE inhibitor/ARB which has been effective and Saxagliptin. Last dilated eye exam: 2021.   Past Medical History:  Diagnosis Date  . Anemia   . Arthritis    knees, hands  . Asthma   . COPD (chronic obstructive pulmonary disease) ([place])   . Diabetes mellitus without complication ([facility])    type 2  . Dysfunctional uterine bleeding   . GERD (gastroesophageal reflux disease)   . Hypertension   . Neuromuscular disorder ([facility])    neuropathy feet  . Seizures ([place]) 09/12/2017   pt states r/t stress and blood sugar - no meds last one 4 months ago, not seen neurologist  . Sickle cell trait ([facility] [place])   . Smoker   . Vitamin D deficiency 10/2019  . Wears glasses     Past Surgical History:  Procedure Laterality Date  . CESAREAN SECTION     x 1. for twins  . DILATION AND CURETTAGE OF UTERUS N/A 08/20/2019   Procedure: DILATATION AND CURETTAGE;  Surgeon: Emily Filbert, MD;  Location: [facility];  Service: Gynecology;  Laterality: N/A;  . ENDOMETRIAL ABLATION N/A 08/20/2019   Procedure: [place] Ablation;  Surgeon: Emily Filbert, MD;  Location: [facility];  Service: Gynecology;  Laterality: N/A;  . EYE SURGERY Bilateral    laser right and cataract removed left eye  . RADIOLOGY WITH ANESTHESIA N/A 09/16/2019  Procedure: MRI WITH ANESTHESIA   L SPINE WITHOUT CONTRAST, T SPINE WITHOUT CONTRAST , CERVICAL WITHOUT CONTRAST;  Surgeon: Radiologist, Medication, MD;  Location: [facility];  Service: Radiology;  Laterality: N/A;  . TUBAL LIGATION     interval BTL  . UPPER GI ENDOSCOPY   07/2017    Family History  Problem Relation Age of Onset  . Diabetes Mother   . Hypertension Mother     Social History   Socioeconomic History  . Marital status: Legally Separated    Spouse name: Not on file  . Number of children: Not on file  . Years of education: Not on file  . Highest education level: Not on file  Occupational History  . Occupation: unemployed  Tobacco Use  . Smoking status: Current Every Day Smoker    Packs/day: 0.50    Years: 26.00    Pack years: 13.00    Types: Cigarettes  . Smokeless tobacco: Never Used  . Tobacco comment: 0.5 pack/day  Vaping Use  . Vaping Use: Never used  Substance and Sexual Activity  . Alcohol use: No  . Drug use: No  . Sexual activity: Not Currently    Birth control/protection: None  Other Topics Concern  . Not on file  Social History Narrative  . Not on file   Social Determinants of Health   Financial Resource Strain:   . Difficulty of Paying Living Expenses: Not on file  Food Insecurity: No Food Insecurity  . Worried About Charity fundraiser in the Last Year: Never true  . Ran Out of Food in the Last Year: Never true  Transportation Needs: No Transportation Needs  . Lack of Transportation (Medical): No  . Lack of Transportation (Non-Medical): No  Physical Activity:   . Days of Exercise per Week: Not on file  . Minutes of Exercise per Session: Not on file  Stress:   . Feeling of Stress : Not on file  Social Connections:   . Frequency of Communication with Friends and Family: Not on file  . Frequency of Social Gatherings with Friends and Family: Not on file  . Attends Religious Services: Not on file  . Active Member of Clubs or Organizations: Not on file  . Attends Archivist Meetings: Not on file  . Marital Status: Not on file  Intimate Partner Violence:   . Fear of Current or Ex-Partner: Not on file  . Emotionally Abused: Not on file  . Physically Abused: Not on file  . Sexually Abused: Not on  file    Outpatient Medications Prior to Visit  Medication Sig Dispense Refill  . albuterol (PROVENTIL) (2.5 MG/3ML) 0.083% nebulizer solution Take 3 mLs (2.5 mg total) by nebulization every 6 (six) hours as needed for wheezing or shortness of breath. 150 mL 3  . albuterol (VENTOLIN HFA) 108 (90 Base) MCG/ACT inhaler Inhale 2 puffs into the lungs every 6 (six) hours as needed for wheezing or shortness of breath. 8 g 3  . blood glucose meter kit and supplies KIT Dispense based on patient and insurance preference. Use up to four times daily as directed. (FOR ICD-9 250.00, 250.01). (Patient taking differently: 1 each by Other route See admin instructions. Dispense based on patient and insurance preference. Use up to four times daily as directed. (FOR ICD-9 250.00, 250.01).) 1 each 0  . Blood Glucose Monitoring Suppl (TRUE METRIX METER) w/Device KIT 1 each by Does not apply route 4 (four) times daily -  before meals and  at bedtime. 1 kit 0  . Blood Pressure Monitoring (BLOOD PRESSURE KIT) DEVI Check BP BID prn (Patient taking differently: 1 each by Other route 2 (two) times daily as needed (blood pressure). ) 1 each 0  . budesonide-formoterol (SYMBICORT) 160-4.5 MCG/ACT inhaler INHALE 2 PUFFS INTO THE LUNGS 2 (TWO) TIMES DAILY. (Patient taking differently: Inhale 2 puffs into the lungs 2 (two) times daily. ) 10.2 g 3  . cetirizine (ZYRTEC) 10 MG tablet Take 1 tablet (10 mg total) by mouth daily. 30 tablet 3  . Cholecalciferol (VITAMIN D-3) 125 MCG (5000 UT) TABS Take 1 tablet by mouth daily. (Patient taking differently: Take 5,000 Units by mouth daily. ) 90 tablet 3  . Diclofenac Sodium (PENNSAID) 2 % SOLN Apply QID prn 112 g 6  . DULoxetine 40 MG CPEP Take 40 mg by mouth daily. 30 capsule 2  . ferrous sulfate 325 (65 FE) MG tablet Take 1 tablet (325 mg total) by mouth 3 (three) times daily with meals. 30 tablet 3  . fluticasone (FLONASE) 50 MCG/ACT nasal spray Place 2 sprays into both nostrils daily. 16  g 6  . furosemide (LASIX) 40 MG tablet Take 1 tablet (40 mg total) by mouth daily. 30 tablet 0  . Glucosamine Sulfate 1000 MG CAPS Take 1 capsule (1,000 mg total) by mouth 2 (two) times daily. 180 capsule 3  . glucose blood (TRUE METRIX BLOOD GLUCOSE TEST) test strip Use as instructed 100 each 12  . hydrocortisone 2.5 % cream Apply topically 2 (two) times daily. 30 g 2  . HYDROmorphone (DILAUDID) 2 MG tablet Take 1-2 tablets (2-4 mg total) by mouth every 6 (six) hours as needed for severe pain. 30 tablet 0  . hydroquinone 4 % cream Apply topically 2 (two) times daily. 28.35 g 0  . Insulin Lispro Prot & Lispro (HUMALOG MIX 75/25 KWIKPEN) (75-25) 100 UNIT/ML Kwikpen INJECT 100 UNITS EVERY 12 HOURS (Patient taking differently: Inject 100 Units into the skin every 12 (twelve) hours. ) 15 mL 11  . Insulin Pen Needle (PEN NEEDLES) 30G X 5 MM MISC 1 Units by Does not apply route as directed. 100 each 11  . losartan (COZAAR) 50 MG tablet Take 1 tablet (50 mg total) by mouth daily. 30 tablet 2  . metoprolol succinate (TOPROL-XL) 100 MG 24 hr tablet Take 1 tablet (100 mg total) by mouth daily. Take with or immediately following a meal. 90 tablet 3  . norethindrone (AYGESTIN) 5 MG tablet Take 2 tablets (10 mg total) by mouth in the morning, at noon, in the evening, and at bedtime. 120 tablet 1  . omeprazole (PRILOSEC) 40 MG capsule Take 1 capsule (40 mg total) by mouth daily. 30 capsule 3  . oxyCODONE (ROXICODONE) 5 MG immediate release tablet Take 1-2 tablets (5-10 mg total) by mouth every 6 (six) hours as needed for severe pain. 30 tablet 0  . potassium chloride SA (KLOR-CON) 20 MEQ tablet Take 1 tablet (20 mEq total) by mouth daily. 30 tablet 3  . promethazine (PHENERGAN) 12.5 MG tablet Take 1 tablet (12.5 mg total) by mouth every 6 (six) hours as needed for nausea or vomiting. 15 tablet 0  . saxagliptin HCl (ONGLYZA) 2.5 MG TABS tablet Take 1 tablet (2.5 mg total) by mouth daily. 90 tablet 3  . Tiotropium  Bromide Monohydrate (SPIRIVA RESPIMAT) 2.5 MCG/ACT AERS Inhale 2 puffs into the lungs daily. 4 g 0  . tizanidine (ZANAFLEX) 6 MG capsule TAKE 1 CAPSULE (6 MG TOTAL)  BY MOUTH 3 (THREE) TIMES DAILY. 90 capsule 0  . tranexamic acid (LYSTEDA) 650 MG TABS tablet Take 2 tablets (1,300 mg total) by mouth 3 (three) times daily. Take during menses for a maximum of five days 30 tablet 0  . TRUEplus Lancets 28G MISC 1 each by Other route in the morning, at noon, in the evening, and at bedtime.    . Turmeric 500 MG CAPS Take 500 mg by mouth 2 (two) times daily. 180 capsule 3  . Vitamin D, Ergocalciferol, (DRISDOL) 1.25 MG (50000 UNIT) CAPS capsule Take 1 capsule (50,000 Units total) by mouth every 7 (seven) days. (Patient taking differently: Take 50,000 Units by mouth every Friday. ) 5 capsule 6  . Lido-Capsaicin-Men-Methyl Sal 0.5-0.035-5-20 % PTCH Apply 1 patch topically daily. (Patient taking differently: Apply 1 patch topically daily as needed (pain). ) 30 patch 2   No facility-administered medications prior to visit.    Allergies  Allergen Reactions  . Ketoprofen Nausea And Vomiting  . Aspirin Nausea Only  . Gabapentin Nausea And Vomiting    upset stomach  . Ibuprofen Nausea And Vomiting  . Liraglutide Nausea And Vomiting  . Naproxen Nausea And Vomiting  . Omeprazole-Sodium Bicarbonate Nausea And Vomiting  . Sulfa Antibiotics Nausea And Vomiting  . Tramadol Nausea And Vomiting    stomach upset    ROS Review of Systems  All other systems reviewed and are negative.     Objective:    Physical Exam Constitutional:      Appearance: She is obese.  HENT:     Head: Normocephalic.     Nose: Nose normal.     Mouth/Throat:     Mouth: Mucous membranes are moist.     Pharynx: Oropharynx is clear.  Eyes:     Pupils: Pupils are equal, round, and reactive to light.  Cardiovascular:     Rate and Rhythm: Normal rate and regular rhythm.     Pulses: Normal pulses.     Heart sounds: Normal heart  sounds.  Pulmonary:     Effort: Pulmonary effort is normal. No respiratory distress.     Breath sounds: No wheezing or rhonchi.  Chest:     Chest wall: No tenderness.  Abdominal:     Palpations: Abdomen is soft.  Musculoskeletal:     Cervical back: Normal range of motion.     Right lower leg: Edema present.     Left lower leg: Edema present.     Comments: 1-2+  Skin:    General: Skin is warm.     Capillary Refill: Capillary refill takes less than 2 seconds.     Comments: Multiple tattoos  Neurological:     General: No focal deficit present.     Mental Status: She is alert and oriented to person, place, and time.  Psychiatric:        Mood and Affect: Mood normal.        Behavior: Behavior normal.        Thought Content: Thought content normal.        Judgment: Judgment normal.     BP 140/61   Pulse 86   Temp 98.4 F (36.9 C) (Temporal)   Ht _0  (1.651 m)   Wt 277 lb (125.6 kg)   SpO2 100%   BMI 46.10 kg/m  Wt Readings from Last 3 Encounters:  06/02/20 277 lb (125.6 kg)  05/26/20 270 lb 12.8 oz (122.8 kg)  05/14/20 (!) 264 lb 14.4 oz (120.2 kg)  Health Maintenance Due  Topic Date Due  . PAP SMEAR-Modifier  07/25/2020    There are no preventive care reminders to display for this patient.  Lab Results  Component Value Date   TSH 3.100 11/05/2019   Lab Results  Component Value Date   WBC 12.2 (H) 06/02/2020   HGB 9.2 (L) 06/02/2020   HCT 32.8 (L) 06/02/2020   MCV 73 (L) 06/02/2020   PLT 313 06/02/2020   Lab Results  Component Value Date   NA 140 06/02/2020   K 4.0 06/02/2020   CO2 23 03/10/2020   GLUCOSE 89 06/02/2020   BUN 4 (L) 06/02/2020   CREATININE 1.00 06/02/2020   BILITOT 0.6 06/02/2020   ALKPHOS 68 06/02/2020   AST 15 06/02/2020   ALT 15 03/07/2020   PROT 6.6 06/02/2020   ALBUMIN 4.1 06/02/2020   CALCIUM 9.1 06/02/2020   ANIONGAP 8 03/10/2020   Lab Results  Component Value Date   CHOL 135 06/02/2020   Lab Results  Component  Value Date   HDL 29 (L) 06/02/2020   Lab Results  Component Value Date   LDLCALC 77 06/02/2020   Lab Results  Component Value Date   TRIG 166 (H) 06/02/2020   Lab Results  Component Value Date   CHOLHDL 4.7 (H) 06/02/2020   Lab Results  Component Value Date   HGBA1C 9.0 (A) 06/02/2020   HGBA1C 9.0 06/02/2020   HGBA1C 9.0 (A) 06/02/2020   HGBA1C 9.0 (A) 06/02/2020      Assessment & Plan:   Problem List Items Addressed This Visit      Cardiovascular and Mediastinum   Essential hypertension Encouraged on going compliance with current medication regimen Encouraged home monitoring and recording BP <130/80 Eating a heart-healthy diet with less salt Encouraged regular physical activity  Recommend Weight loss      Relevant Medications   rosuvastatin (CRESTOR) 5 MG tablet     Respiratory   Chronic obstructive pulmonary disease (HCC) Discussed smoking cessation Continue with current regimen     Endocrine   Type 2 diabetes mellitus without complication, with long-term current use of insulin ([facility]) Encourage compliance with current treatment regimen  Encourage regular CBG monitoring Encourage contacting office if excessive hyperglycemia and or hypoglycemia Lifestyle modification with healthy diet (fewer calories, more high fiber foods, whole grains and non-starchy vegetables, lower fat meat and fish, low-fat diary include healthy oils) regular exercise (physical activity) and weight loss Nutritional consult recommended Regular dental visits encouraged Home BP monitoring also encouraged goal <130/80     Relevant Medications   rosuvastatin (CRESTOR) 5 MG tablet    Other Visit Diagnoses    Uncontrolled type 2 diabetes mellitus with chronic kidney disease ([place])    -  Primary   Relevant Medications   rosuvastatin (CRESTOR) 5 MG tablet   Other Relevant Orders   POCT URINALYSIS DIP (CLINITEK) (Completed)   POCT glycosylated hemoglobin (Hb A1C) (Completed)    Lymphocytosis     Reevaluation of CBC      Meds ordered this encounter  Medications  . rosuvastatin (CRESTOR) 5 MG tablet    Sig: Take 1 tablet (5 mg total) by mouth daily.    Dispense:  90 tablet    Refill:  3    Order Specific Question:   Supervising Provider    Answer:   Tresa Garter [id number]    Follow-up: Return in about 3 months (around 09/02/2020).    Vevelyn Francois, NP

## 2020-06-03 ENCOUNTER — Other Ambulatory Visit: Payer: Self-pay | Admitting: Nurse Practitioner

## 2020-06-03 ENCOUNTER — Telehealth: Payer: Self-pay | Admitting: Podiatry

## 2020-06-03 LAB — CBC WITH DIFFERENTIAL/PLATELET
Basophils Absolute: 0.1 10*3/uL (ref 0.0–0.2)
Basos: 1 %
EOS (ABSOLUTE): 0.5 10*3/uL — ABNORMAL HIGH (ref 0.0–0.4)
Eos: 4 %
Hematocrit: 32.8 % — ABNORMAL LOW (ref 34.0–46.6)
Hemoglobin: 9.2 g/dL — ABNORMAL LOW (ref 11.1–15.9)
Immature Grans (Abs): 0.1 10*3/uL (ref 0.0–0.1)
Immature Granulocytes: 1 %
Lymphocytes Absolute: 2.7 10*3/uL (ref 0.7–3.1)
Lymphs: 22 %
MCH: 20.6 pg — ABNORMAL LOW (ref 26.6–33.0)
MCHC: 28 g/dL — ABNORMAL LOW (ref 31.5–35.7)
MCV: 73 fL — ABNORMAL LOW (ref 79–97)
Monocytes Absolute: 0.7 10*3/uL (ref 0.1–0.9)
Monocytes: 6 %
Neutrophils Absolute: 8.3 10*3/uL — ABNORMAL HIGH (ref 1.4–7.0)
Neutrophils: 66 %
Platelets: 313 10*3/uL (ref 150–450)
RBC: 4.47 x10E6/uL (ref 3.77–5.28)
RDW: 21.1 % — ABNORMAL HIGH (ref 11.7–15.4)
WBC: 12.2 10*3/uL — ABNORMAL HIGH (ref 3.4–10.8)

## 2020-06-03 LAB — COMP. METABOLIC PANEL (12)
AST: 15 IU/L (ref 0–40)
Albumin/Globulin Ratio: 1.6 (ref 1.2–2.2)
Albumin: 4.1 g/dL (ref 3.8–4.8)
Alkaline Phosphatase: 68 IU/L (ref 48–121)
BUN/Creatinine Ratio: 4 — ABNORMAL LOW (ref 9–23)
BUN: 4 mg/dL — ABNORMAL LOW (ref 6–24)
Bilirubin Total: 0.6 mg/dL (ref 0.0–1.2)
Calcium: 9.1 mg/dL (ref 8.7–10.2)
Chloride: 106 mmol/L (ref 96–106)
Creatinine, Ser: 1 mg/dL (ref 0.57–1.00)
GFR calc Af Amer: 80 mL/min/{1.73_m2} (ref >59)
GFR calc non Af Amer: 69 mL/min/{1.73_m2} (ref >59)
Globulin, Total: 2.5 g/dL (ref 1.5–4.5)
Glucose: 89 mg/dL (ref 65–99)
Potassium: 4 mmol/L (ref 3.5–5.2)
Sodium: 140 mmol/L (ref 134–144)
Total Protein: 6.6 g/dL (ref 6.0–8.5)

## 2020-06-03 LAB — LIPID PANEL
Chol/HDL Ratio: 4.7 ratio — ABNORMAL HIGH (ref 0.0–4.4)
Cholesterol, Total: 135 mg/dL (ref 100–199)
HDL: 29 mg/dL — ABNORMAL LOW (ref >39)
LDL Chol Calc (NIH): 77 mg/dL (ref 0–99)
Triglycerides: 166 mg/dL — ABNORMAL HIGH (ref 0–149)
VLDL Cholesterol Cal: 29 mg/dL (ref 5–40)

## 2020-06-03 MED ORDER — AMOXICILLIN-POT CLAVULANATE 875-125 MG PO TABS: 1.0000 | ORAL_TABLET | Freq: Two times a day (BID) | ORAL | 0 refills | Status: AC

## 2020-06-03 MED ORDER — ROSUVASTATIN CALCIUM 5 MG PO TABS
5.0000 mg | ORAL_TABLET | Freq: Every day | ORAL | 3 refills | Status: DC
Start: 2020-06-03 — End: 2020-08-11

## 2020-06-03 MED FILL — ROSUVASTATIN CALCIUM 5 MG T: 5 | 30 days supply | Qty: 30 | Fill #0

## 2020-06-03 MED FILL — AMOX-CLAV 875-125 MG TABLET: 875-125 | 10 days supply | Qty: 20 | Fill #0

## 2020-06-03 NOTE — Telephone Encounter (Signed)
Pt called stating that she wants a MRI done before she has surgery

## 2020-06-07 ENCOUNTER — Telehealth (INDEPENDENT_AMBULATORY_CARE_PROVIDER_SITE_OTHER): Payer: Self-pay | Admitting: *Deleted

## 2020-06-07 ENCOUNTER — Telehealth: Payer: Self-pay | Admitting: Family Medicine

## 2020-06-07 ENCOUNTER — Other Ambulatory Visit: Payer: Self-pay | Admitting: Family Medicine

## 2020-06-07 DIAGNOSIS — R1033 Periumbilical pain: Secondary | ICD-10-CM

## 2020-06-07 MED ORDER — DICLOFENAC SODIUM 75 MG PO TBEC
75.0000 mg | DELAYED_RELEASE_TABLET | Freq: Two times a day (BID) | ORAL | 3 refills | Status: DC | PRN
Start: 2020-06-07 — End: 2020-07-03

## 2020-06-07 MED FILL — DICLOFENAC SOD EC 75 MG TAB: 75 | 30 days supply | Qty: 60 | Fill #0

## 2020-06-07 NOTE — Telephone Encounter (Signed)
Unfortunately, there aren't any other similar options.  She'll have to go back to Voltaren Gel instead.

## 2020-06-07 NOTE — Telephone Encounter (Signed)
Jill Shaw left a voice message this pm that she was calling to get in touch with [name] to see if she faxed her medical records because her provider said they haven't received them yet. Also wants a refill of her pain med . [name],RN

## 2020-06-07 NOTE — Telephone Encounter (Signed)
Pt states the cream Dr.Hilts is prescribing costs too much so she would like a CB to discuss her options.  [phone]

## 2020-06-07 NOTE — Telephone Encounter (Signed)
Rx sent 

## 2020-06-07 NOTE — Telephone Encounter (Signed)
I spoke with the pt and she would like oral anti-inflammatory because she was told it was cheaper

## 2020-06-07 NOTE — Telephone Encounter (Signed)
Please advise 

## 2020-06-08 NOTE — Telephone Encounter (Signed)
Returned patiens call. Patient wants to know if records have been sent to [facility].  Patient is wanting to have pain medications called in. Patient was prescribed Voltaren and reports she has not obtained yet.   Dr. [name] is to see patient on 8/27. Will reach out to him to see about getting a refill on her Dilaudid, she is to ask him when she is in the office if not refilled prior to that time.

## 2020-06-08 NOTE — [facility] Specialist Note (Signed)
Pt will call back to reschedule at either [phone] (Brekyn Huntoon's office) or the front office at [phone] for a virtual consult; due to phone issues.

## 2020-06-09 ENCOUNTER — Ambulatory Visit: Payer: Medicaid Other | Admitting: Physical Therapy

## 2020-06-10 MED FILL — AMOX-CLAV 875-125 MG TABLET: 875-125 | 10 days supply | Qty: 20 | Fill #0

## 2020-06-10 NOTE — Telephone Encounter (Signed)
Pt calling back to follow up on previous request for MRI. Pt is wanting to get MRI done before having surgery in September

## 2020-06-11 ENCOUNTER — Ambulatory Visit: Payer: Self-pay | Admitting: Obstetrics and Gynecology

## 2020-06-11 ENCOUNTER — Other Ambulatory Visit: Payer: Self-pay | Admitting: Obstetrics & Gynecology

## 2020-06-11 ENCOUNTER — Telehealth: Payer: Self-pay | Admitting: Obstetrics and Gynecology

## 2020-06-11 MED ORDER — PROMETHAZINE HCL 25 MG PO TABS
25.0000 mg | ORAL_TABLET | Freq: Four times a day (QID) | ORAL | 2 refills | Status: DC | PRN
Start: 2020-06-11 — End: 2020-07-04

## 2020-06-11 MED FILL — ?HUMALOG MIX 75-25 KWIKPEN: (75-25) 100 | 21 days supply | Qty: 45 | Fill #0

## 2020-06-11 NOTE — Telephone Encounter (Signed)
Patient canceled her appointment today due to having too much pain. She is requesting a call back from the nurse to see if Dr. [name] will refill her pain medication.

## 2020-06-11 NOTE — Progress Notes (Signed)
Pt called into after hours nurse line with abdominal pain and dry heaving.  Pt has been seen in our office for abdominal pain.  Med list lists oxycodone as medication.  Last CT in April:  IMPRESSION: 1. Scattered hypodense areas within the liver, the most masslike appreciated at the posterior-inferior margin of the RIGHT liver lobe measuring 8.4 cm, most likely atypical geographic fatty infiltration but neoplastic or infectious process cannot be entirely excluded. Recommend nonemergent liver MRI with contrast for further characterization. When the patient is clinically stable and able to follow directions and hold their breath (preferably as an outpatient) further evaluation with dedicated abdominal MRI should be considered. 2. Remainder of the abdomen and pelvis CT is unremarkable. No bowel obstruction or evidence of bowel wall inflammation. No free fluid or abscess collection. No evidence of acute solid organ abnormality. No renal or ureteral calculi. Appendix is normal.  I explained I can't call in narcotics over the phone and will prescribe phenergan.  Pt will need to see MD for evaluation (ED or urgent care)  Elsie Lincoln, MD

## 2020-06-14 NOTE — Telephone Encounter (Signed)
Called pt; VM left stating I am returning patient's phone call and she may call the office back with future concerns. Callback number given. MyChart message sent.

## 2020-06-16 ENCOUNTER — Ambulatory Visit: Payer: Medicaid Other | Attending: Family Medicine | Admitting: Physical Therapy

## 2020-06-16 ENCOUNTER — Other Ambulatory Visit: Payer: Self-pay

## 2020-06-16 ENCOUNTER — Other Ambulatory Visit: Payer: Self-pay | Admitting: Nurse Practitioner

## 2020-06-16 ENCOUNTER — Ambulatory Visit: Payer: Self-pay | Admitting: Clinical

## 2020-06-16 ENCOUNTER — Encounter: Payer: Self-pay | Admitting: Physical Therapy

## 2020-06-16 DIAGNOSIS — G8929 Other chronic pain: Secondary | ICD-10-CM | POA: Diagnosis present

## 2020-06-16 DIAGNOSIS — M25561 Pain in right knee: Secondary | ICD-10-CM | POA: Insufficient documentation

## 2020-06-16 DIAGNOSIS — M6281 Muscle weakness (generalized): Secondary | ICD-10-CM | POA: Diagnosis present

## 2020-06-16 NOTE — Therapy (Addendum)
[place], [place], [address] Phone: [phone]   Fax:  [phone]  Physical Therapy Treatment/Discharge  Patient Details  Name: Jill Shaw MRN: [id number] Date of Birth: 1976/05/25 Referring Provider (PT): Dr. Elijio Miles Hilts    Encounter Date: 06/16/2020   PT End of Session - 06/16/20 1632    Visit Number 2    Number of Visits 6    Authorization Type self pay    PT Start Time 1630    PT Stop Time 1700    PT Time Calculation (min) 30 min    Activity Tolerance Patient limited by pain    Behavior During Therapy [facility] for tasks assessed/performed           Past Medical History:  Diagnosis Date  . Anemia   . Arthritis    knees, hands  . Asthma   . COPD (chronic obstructive pulmonary disease) ([place])   . Diabetes mellitus without complication ([place])    type 2  . Dysfunctional uterine bleeding   . GERD (gastroesophageal reflux disease)   . Hypertension   . Neuromuscular disorder ([facility])    neuropathy feet  . Seizures ([place]) 09/12/2017   pt states r/t stress and blood sugar - no meds last one 4 months ago, not seen neurologist  . Sickle cell trait ([place])   . Smoker   . Vitamin D deficiency 10/2019  . Wears glasses     Past Surgical History:  Procedure Laterality Date  . CESAREAN SECTION     x 1. for twins  . DILATION AND CURETTAGE OF UTERUS N/A 08/20/2019   Procedure: DILATATION AND CURETTAGE;  Surgeon: Emily Filbert, MD;  Location: [place];  Service: Gynecology;  Laterality: N/A;  . ENDOMETRIAL ABLATION N/A 08/20/2019   Procedure: [place] Ablation;  Surgeon: Emily Filbert, MD;  Location: [facility];  Service: Gynecology;  Laterality: N/A;  . EYE SURGERY Bilateral    laser right and cataract removed left eye  . RADIOLOGY WITH ANESTHESIA N/A 09/16/2019   Procedure: MRI WITH ANESTHESIA   L SPINE WITHOUT CONTRAST, T SPINE WITHOUT CONTRAST , CERVICAL WITHOUT CONTRAST;  Surgeon: Radiologist, Medication, MD;   Location: [facility];  Service: Radiology;  Laterality: N/A;  . TUBAL LIGATION     interval BTL  . UPPER GI ENDOSCOPY  07/2017    There were no vitals filed for this visit.   Subjective Assessment - 06/16/20 1632    Subjective Can we make my appointments 30 min? I could not move after the last time. Exercises were alright, they made the same pain but a little different.    Currently in Pain? Yes    Pain Score 7     Pain Location Knee    Pain Orientation Right    Pain Descriptors / Indicators Tingling              [facility] PT Assessment - 06/16/20 0001      AROM   Right Knee Extension 0                         [facility] Adult PT Treatment/Exercise - 06/16/20 0001      Knee/Hip Exercises: Stretches   Gastroc Stretch Limitations slant board & in lunge position      Knee/Hip Exercises: Aerobic   Stationary Bike 5 min without resistance      Knee/Hip Exercises: Supine   Quad Sets Both;5 reps  Quad Sets Limitations 5s holds    Hip Adduction Isometric Limitations hip abduction/adduction slide on table    Other Supine Knee/Hip Exercises heel slides x5 each                  PT Education - 06/16/20 1701    Education Details balance of strength/flexibility, knee anatomy, brace, exercise form/rationale    Person(s) Educated Patient    Methods Explanation;Demonstration;Tactile cues;Handout;Verbal cues    Comprehension Verbalized understanding;Returned demonstration;Verbal cues required;Tactile cues required;Need further instruction            PT Short Term Goals - 05/20/20 1650      PT SHORT TERM GOAL #1   Title Pt will improve R knee ROM to 0 degrees extension.    Time 3    Period Weeks    Status New    Target Date 06/10/20      PT SHORT TERM GOAL #2   Title Pt will improve R knee strength to 5/5 in all directions.    Time 3    Period Weeks    Status New    Target Date 06/10/20      PT SHORT TERM GOAL #3   Title Pt will be independent with HEP to  continue strengthening on her own at home.    Time 3    Period Weeks    Status New    Target Date 06/10/20             PT Long Term Goals - 05/20/20 1652      PT LONG TERM GOAL #1   Title Pt will be able to stand for 30 minutes with no reports of pain to be able to carry out IADLs.    Time 6    Period Weeks    Status New    Target Date 07/01/20      PT LONG TERM GOAL #2   Title Pt will be able to ascend and descend 14 stairs to her house with proper mechanics and no reports of pain or knee buckling.    Time 6    Period Weeks    Status New    Target Date 07/01/20      PT LONG TERM GOAL #3   Title Pt will be able to walk 1000' with no reports of knee pain to be able to go to the mall.    Time 6    Period Weeks    Status New    Target Date 07/01/20                 Plan - 06/16/20 1701    Clinical Impression Statement Appointment limited to 30 min per pt request. HEP altered to give exrcises that did not increase her pain as the first one did. Pt reports feeling like her knee needs stability- I recommended using a knee sleeve without patellar hole for some added support until strength increases. Requested that she wear tennis shoes rather than slip on sandals to her appointments. Tends to rest in external rotation so I asked her to focus on neutral alignment for knee.    PT Treatment/Interventions ADLs/Self Care Home Management;DME Instruction;Ultrasound;Traction;Moist Heat;Iontophoresis 31m/ml Dexamethasone;Gait training;Stair training;Functional mobility training;Therapeutic activities;Therapeutic exercise;Balance training;Neuromuscular re-education;Manual techniques;Patient/family education;Passive range of motion;Dry needling;Joint Manipulations;Spinal Manipulations;Vasopneumatic Device;Taping    PT Next Visit Plan review HEP. try [facility] weight shifts    PT Home Exercise Plan [id number]    Consulted and Agree with Plan of Care Patient  Patient will benefit  from skilled therapeutic intervention in order to improve the following deficits and impairments:  Abnormal gait, Decreased range of motion, Difficulty walking, Pain, Decreased activity tolerance, Improper body mechanics, Decreased mobility, Decreased strength  Visit Diagnosis: Chronic pain of right knee  Muscle weakness (generalized)     Problem List Patient Active Problem List   Diagnosis Date Noted  . Abdominal pain 03/08/2020  . Chronic diastolic CHF (congestive heart failure) ([place]) 03/08/2020  . Acute encephalopathy 01/29/2020  . AKI (acute kidney injury) ([place]) 01/29/2020  . Symptomatic anemia 12/19/2019  . Elevated sed rate 11/26/2019  . Elevated C-reactive protein (CRP) 11/26/2019  . Hypoglycemia 02/07/2019  . Hypotension 02/07/2019  . Lactic acidosis 02/07/2019  . Right ankle pain 02/07/2019  . Chronic obstructive pulmonary disease ([place]) 02/05/2019  . Seizure ([place]) 07/31/2018  . Vitamin D deficiency 05/31/2017  . Gastroesophageal reflux disease 05/24/2017  . Abnormal uterine bleeding (AUB) 05/17/2017  . Anemia of chronic disease 04/06/2017  . Knee pain, chronic 03/27/2016  . Type 2 diabetes mellitus without complication, with long-term current use of insulin ([place]) 03/27/2016  . Essential hypertension 03/27/2016  . Morbid obesity ([place]) 03/27/2016  . Irritable bowel syndrome with constipation 03/27/2016  . Tobacco dependence 03/27/2016  . CKD (chronic kidney disease) stage 2, GFR 60-89 ml/min 03/25/2016  . Arthropathy, lower leg 05/04/2013  . CTS (carpal tunnel syndrome) 05/04/2013  . Peripheral edema 05/04/2013  . Chronic pain 05/13/2012  . Diabetic gastroparesis ([place]) 11/06/2006  . Hot flashes 11/06/2006     C.  PT, DPT 06/16/20 5:04 PM   [facility] [address] [place], [place], [address] Phone: [phone]   Fax:  [phone]  Name: Jill Shaw MRN: [id number] Date of Birth:  November 14, 1975  PHYSICAL THERAPY DISCHARGE SUMMARY  Visits from Start of Care: 2  Current functional level related to goals / functional outcomes: See above   Remaining deficits: See above   Education / Equipment: Anatomy of condition, POC, HEP, exercise form/rationale  Plan: Patient agrees to discharge.  Patient goals were not met. Patient is being discharged due to not returning since the last visit.  ?????      C.  PT, DPT 08/16/20 2:40 PM

## 2020-06-17 DIAGNOSIS — M79676 Pain in unspecified toe(s): Secondary | ICD-10-CM

## 2020-06-22 ENCOUNTER — Emergency Department (HOSPITAL_COMMUNITY): Payer: Medicaid Other

## 2020-06-22 ENCOUNTER — Encounter (HOSPITAL_COMMUNITY): Payer: Self-pay | Admitting: Internal Medicine

## 2020-06-22 ENCOUNTER — Other Ambulatory Visit: Payer: Self-pay

## 2020-06-22 ENCOUNTER — Inpatient Hospital Stay (HOSPITAL_COMMUNITY)
Admission: EM | Admit: 2020-06-22 | Discharge: 2020-06-25 | DRG: 292 | Disposition: A | Discharge status: Home / Self Care | Payer: Medicaid Other | Attending: Family Medicine | Admitting: Family Medicine

## 2020-06-22 DIAGNOSIS — I5033 Acute on chronic diastolic (congestive) heart failure: Secondary | ICD-10-CM | POA: Diagnosis present

## 2020-06-22 DIAGNOSIS — I10 Essential (primary) hypertension: Secondary | ICD-10-CM | POA: Diagnosis not present

## 2020-06-22 DIAGNOSIS — R609 Edema, unspecified: Secondary | ICD-10-CM

## 2020-06-22 DIAGNOSIS — Z79899 Other long term (current) drug therapy: Secondary | ICD-10-CM

## 2020-06-22 DIAGNOSIS — R0602 Shortness of breath: Secondary | ICD-10-CM | POA: Diagnosis not present

## 2020-06-22 DIAGNOSIS — J80 Acute respiratory distress syndrome: Secondary | ICD-10-CM | POA: Diagnosis not present

## 2020-06-22 DIAGNOSIS — Z7951 Long term (current) use of inhaled steroids: Secondary | ICD-10-CM

## 2020-06-22 DIAGNOSIS — R569 Unspecified convulsions: Secondary | ICD-10-CM

## 2020-06-22 DIAGNOSIS — Z20822 Contact with and (suspected) exposure to covid-19: Secondary | ICD-10-CM | POA: Diagnosis present

## 2020-06-22 DIAGNOSIS — E119 Type 2 diabetes mellitus without complications: Secondary | ICD-10-CM

## 2020-06-22 DIAGNOSIS — E785 Hyperlipidemia, unspecified: Secondary | ICD-10-CM | POA: Diagnosis present

## 2020-06-22 DIAGNOSIS — G894 Chronic pain syndrome: Secondary | ICD-10-CM | POA: Diagnosis present

## 2020-06-22 DIAGNOSIS — G40909 Epilepsy, unspecified, not intractable, without status epilepticus: Secondary | ICD-10-CM | POA: Diagnosis present

## 2020-06-22 DIAGNOSIS — Z794 Long term (current) use of insulin: Secondary | ICD-10-CM

## 2020-06-22 DIAGNOSIS — F17210 Nicotine dependence, cigarettes, uncomplicated: Secondary | ICD-10-CM | POA: Diagnosis present

## 2020-06-22 DIAGNOSIS — F17200 Nicotine dependence, unspecified, uncomplicated: Secondary | ICD-10-CM | POA: Diagnosis present

## 2020-06-22 DIAGNOSIS — E66813 Obesity, class 3: Secondary | ICD-10-CM | POA: Diagnosis present

## 2020-06-22 DIAGNOSIS — J449 Chronic obstructive pulmonary disease, unspecified: Secondary | ICD-10-CM | POA: Diagnosis present

## 2020-06-22 DIAGNOSIS — Z6841 Body Mass Index (BMI) 40.0 and over, adult: Secondary | ICD-10-CM

## 2020-06-22 DIAGNOSIS — G8929 Other chronic pain: Secondary | ICD-10-CM | POA: Diagnosis present

## 2020-06-22 DIAGNOSIS — I110 Hypertensive heart disease with heart failure: Principal | ICD-10-CM | POA: Diagnosis present

## 2020-06-22 DIAGNOSIS — E1140 Type 2 diabetes mellitus with diabetic neuropathy, unspecified: Secondary | ICD-10-CM | POA: Diagnosis present

## 2020-06-22 DIAGNOSIS — K219 Gastro-esophageal reflux disease without esophagitis: Secondary | ICD-10-CM | POA: Diagnosis present

## 2020-06-22 DIAGNOSIS — Z66 Do not resuscitate: Secondary | ICD-10-CM | POA: Diagnosis present

## 2020-06-22 DIAGNOSIS — D573 Sickle-cell trait: Secondary | ICD-10-CM | POA: Diagnosis present

## 2020-06-22 DIAGNOSIS — I248 Other forms of acute ischemic heart disease: Secondary | ICD-10-CM | POA: Diagnosis present

## 2020-06-22 HISTORY — DX: Chronic diastolic (congestive) heart failure: I50.32

## 2020-06-22 LAB — BASIC METABOLIC PANEL
Anion gap: 12 (ref 5–15)
BUN: 8 mg/dL (ref 6–20)
CO2: 23 mmol/L (ref 22–32)
Calcium: 8.9 mg/dL (ref 8.9–10.3)
Chloride: 104 mmol/L (ref 98–111)
Creatinine, Ser: 1.17 mg/dL — ABNORMAL HIGH (ref 0.44–1.00)
GFR calc Af Amer: >60 mL/min (ref >60)
GFR calc non Af Amer: 57 mL/min — ABNORMAL LOW (ref >60)
Glucose, Bld: 178 mg/dL — ABNORMAL HIGH (ref 70–99)
Potassium: 3.6 mmol/L (ref 3.5–5.1)
Sodium: 139 mmol/L (ref 135–145)

## 2020-06-22 LAB — CBC WITH DIFFERENTIAL/PLATELET
Abs Immature Granulocytes: 0.05 10*3/uL (ref 0.00–0.07)
Basophils Absolute: 0.1 10*3/uL (ref 0.0–0.1)
Basophils Relative: 1 %
Eosinophils Absolute: 0.5 10*3/uL (ref 0.0–0.5)
Eosinophils Relative: 4 %
HCT: 29.3 % — ABNORMAL LOW (ref 36.0–46.0)
Hemoglobin: 8.3 g/dL — ABNORMAL LOW (ref 12.0–15.0)
Immature Granulocytes: 0 %
Lymphocytes Relative: 21 %
Lymphs Abs: 2.6 10*3/uL (ref 0.7–4.0)
MCH: 19.7 pg — ABNORMAL LOW (ref 26.0–34.0)
MCHC: 28.3 g/dL — ABNORMAL LOW (ref 30.0–36.0)
MCV: 69.4 fL — ABNORMAL LOW (ref 80.0–100.0)
Monocytes Absolute: 0.7 10*3/uL (ref 0.1–1.0)
Monocytes Relative: 6 %
Neutro Abs: 8.4 10*3/uL — ABNORMAL HIGH (ref 1.7–7.7)
Neutrophils Relative %: 68 %
Platelets: 235 10*3/uL (ref 150–400)
RBC: 4.22 MIL/uL (ref 3.87–5.11)
RDW: 23.9 % — ABNORMAL HIGH (ref 11.5–15.5)
WBC: 12.4 10*3/uL — ABNORMAL HIGH (ref 4.0–10.5)
nRBC: 0 % (ref 0.0–0.2)

## 2020-06-22 LAB — GLUCOSE, CAPILLARY
Glucose-Capillary: 500 mg/dL — ABNORMAL HIGH (ref 70–99)
Glucose-Capillary: 552 mg/dL (ref 70–99)
Glucose-Capillary: 495 mg/dL — ABNORMAL HIGH (ref 70–99)
Glucose-Capillary: 437 mg/dL — ABNORMAL HIGH (ref 70–99)

## 2020-06-22 LAB — TROPONIN I (HIGH SENSITIVITY)
Troponin I (High Sensitivity): 103 ng/L (ref <18)
Troponin I (High Sensitivity): 86 ng/L — ABNORMAL HIGH (ref <18)

## 2020-06-22 LAB — BRAIN NATRIURETIC PEPTIDE: B Natriuretic Peptide: 266.3 pg/mL — ABNORMAL HIGH (ref 0.0–100.0)

## 2020-06-22 LAB — CBG MONITORING, ED: Glucose-Capillary: 397 mg/dL — ABNORMAL HIGH (ref 70–99)

## 2020-06-22 LAB — SARS CORONAVIRUS 2 BY RT PCR (HOSPITAL ORDER, PERFORMED IN ~~LOC~~ HOSPITAL LAB): SARS Coronavirus 2: NEGATIVE

## 2020-06-22 MED ORDER — INSULIN ASPART 100 UNIT/ML ~~LOC~~ SOLN: 0.0000 [IU] | Freq: Every day | SUBCUTANEOUS | Status: DC

## 2020-06-22 MED ORDER — ROSUVASTATIN CALCIUM 5 MG PO TABS
5.0000 mg | ORAL_TABLET | Freq: Every day | ORAL | Status: DC
  Administered 2020-06-22 – 2020-06-25 (×4): 5 mg via ORAL
  Filled 2020-06-22 (×4): qty 1

## 2020-06-22 MED ORDER — FUROSEMIDE 10 MG/ML IJ SOLN
40.0000 mg | Freq: Once | INTRAMUSCULAR | Status: AC
  Administered 2020-06-22: 40 mg via INTRAVENOUS
  Filled 2020-06-22: qty 4

## 2020-06-22 MED ORDER — TIZANIDINE HCL 4 MG PO TABS
6.0000 mg | ORAL_TABLET | Freq: Once | ORAL | Status: AC
  Administered 2020-06-22: 6 mg via ORAL
  Filled 2020-06-22: qty 2

## 2020-06-22 MED ORDER — SODIUM CHLORIDE 0.9% FLUSH: 3.0000 mL | INTRAVENOUS | Status: DC | PRN

## 2020-06-22 MED ORDER — PANTOPRAZOLE SODIUM 40 MG PO TBEC
40.0000 mg | DELAYED_RELEASE_TABLET | Freq: Every day | ORAL | Status: DC
  Administered 2020-06-22 – 2020-06-25 (×4): 40 mg via ORAL
  Filled 2020-06-22 (×4): qty 1

## 2020-06-22 MED ORDER — UMECLIDINIUM BROMIDE 62.5 MCG/INH IN AEPB
2.0000 | INHALATION_SPRAY | Freq: Every day | RESPIRATORY_TRACT | Status: DC
  Administered 2020-06-22 – 2020-06-25 (×5): 2 via RESPIRATORY_TRACT
  Filled 2020-06-22: qty 7

## 2020-06-22 MED ORDER — SODIUM CHLORIDE 0.9% FLUSH
3.0000 mL | Freq: Two times a day (BID) | INTRAVENOUS | Status: DC
  Administered 2020-06-22 – 2020-06-25 (×7): 3 mL via INTRAVENOUS

## 2020-06-22 MED ORDER — LOSARTAN POTASSIUM 50 MG PO TABS
50.0000 mg | ORAL_TABLET | Freq: Every day | ORAL | Status: DC
  Administered 2020-06-22 – 2020-06-25 (×4): 50 mg via ORAL
  Filled 2020-06-22 (×4): qty 1

## 2020-06-22 MED ORDER — INSULIN ASPART 100 UNIT/ML ~~LOC~~ SOLN
0.0000 [IU] | Freq: Three times a day (TID) | SUBCUTANEOUS | Status: DC
  Administered 2020-06-22: 10 [IU] via SUBCUTANEOUS
  Administered 2020-06-22 (×3): 20 [IU] via SUBCUTANEOUS
  Administered 2020-06-23: 11 [IU] via SUBCUTANEOUS
  Administered 2020-06-23: 20 [IU] via SUBCUTANEOUS
  Administered 2020-06-23: 7 [IU] via SUBCUTANEOUS
  Administered 2020-06-24: 4 [IU] via SUBCUTANEOUS
  Administered 2020-06-24 – 2020-06-25 (×2): 3 [IU] via SUBCUTANEOUS
  Administered 2020-06-25: 7 [IU] via SUBCUTANEOUS

## 2020-06-22 MED ORDER — METOPROLOL SUCCINATE ER 100 MG PO TB24
100.0000 mg | ORAL_TABLET | Freq: Every day | ORAL | Status: DC
  Administered 2020-06-22 – 2020-06-25 (×4): 100 mg via ORAL
  Filled 2020-06-22 (×4): qty 1

## 2020-06-22 MED ORDER — DULOXETINE HCL 20 MG PO CPEP
40.0000 mg | ORAL_CAPSULE | Freq: Every day | ORAL | Status: DC
  Administered 2020-06-22 – 2020-06-25 (×4): 40 mg via ORAL
  Filled 2020-06-22 (×5): qty 2

## 2020-06-22 MED ORDER — ALBUTEROL SULFATE (2.5 MG/3ML) 0.083% IN NEBU: 3.0000 mL | INHALATION_SOLUTION | Freq: Four times a day (QID) | RESPIRATORY_TRACT | Status: DC | PRN

## 2020-06-22 MED ORDER — POTASSIUM CHLORIDE CRYS ER 20 MEQ PO TBCR
20.0000 meq | EXTENDED_RELEASE_TABLET | Freq: Every day | ORAL | Status: DC
  Administered 2020-06-22 – 2020-06-25 (×4): 20 meq via ORAL
  Filled 2020-06-22 (×5): qty 1

## 2020-06-22 MED ORDER — MOMETASONE FURO-FORMOTEROL FUM 200-5 MCG/ACT IN AERO
2.0000 | INHALATION_SPRAY | Freq: Two times a day (BID) | RESPIRATORY_TRACT | Status: DC
  Administered 2020-06-22 – 2020-06-25 (×7): 2 via RESPIRATORY_TRACT
  Filled 2020-06-22: qty 8.8

## 2020-06-22 MED ORDER — FUROSEMIDE 10 MG/ML IJ SOLN
40.0000 mg | Freq: Two times a day (BID) | INTRAMUSCULAR | Status: DC
  Administered 2020-06-22 – 2020-06-23 (×2): 40 mg via INTRAVENOUS
  Filled 2020-06-22 (×2): qty 4

## 2020-06-22 MED ORDER — INSULIN ASPART 100 UNIT/ML ~~LOC~~ SOLN
8.0000 [IU] | Freq: Once | SUBCUTANEOUS | Status: AC
  Administered 2020-06-22: 8 [IU] via SUBCUTANEOUS

## 2020-06-22 MED ORDER — SODIUM CHLORIDE 0.9 % IV SOLN: 250.0000 mL | INTRAVENOUS | Status: DC | PRN

## 2020-06-22 MED ORDER — ENOXAPARIN SODIUM 60 MG/0.6ML ~~LOC~~ SOLN
60.0000 mg | SUBCUTANEOUS | Status: DC
  Administered 2020-06-22 – 2020-06-25 (×4): 60 mg via SUBCUTANEOUS
  Filled 2020-06-22 (×3): qty 0.6

## 2020-06-22 MED ORDER — TIZANIDINE HCL 4 MG PO TABS
6.0000 mg | ORAL_TABLET | Freq: Three times a day (TID) | ORAL | Status: DC
  Administered 2020-06-22 – 2020-06-25 (×10): 6 mg via ORAL
  Filled 2020-06-22 (×11): qty 2

## 2020-06-22 MED ORDER — LORATADINE 10 MG PO TABS
10.0000 mg | ORAL_TABLET | Freq: Every day | ORAL | Status: DC
  Administered 2020-06-22 – 2020-06-25 (×4): 10 mg via ORAL
  Filled 2020-06-22 (×4): qty 1

## 2020-06-22 MED ORDER — OXYCODONE HCL 5 MG PO TABS
5.0000 mg | ORAL_TABLET | Freq: Four times a day (QID) | ORAL | Status: DC | PRN
  Administered 2020-06-22: 5 mg via ORAL
  Administered 2020-06-22 – 2020-06-25 (×6): 10 mg via ORAL
  Filled 2020-06-22 (×4): qty 2
  Filled 2020-06-22: qty 1
  Filled 2020-06-22 (×2): qty 2

## 2020-06-22 MED ORDER — INSULIN ASPART PROT & ASPART (70-30 MIX) 100 UNIT/ML ~~LOC~~ SUSP
100.0000 [IU] | Freq: Two times a day (BID) | SUBCUTANEOUS | Status: DC
  Administered 2020-06-22 – 2020-06-24 (×5): 100 [IU] via SUBCUTANEOUS
  Filled 2020-06-22: qty 10

## 2020-06-22 MED ORDER — FLUTICASONE PROPIONATE 50 MCG/ACT NA SUSP
2.0000 | Freq: Every day | NASAL | Status: DC
  Administered 2020-06-22 – 2020-06-25 (×4): 2 via NASAL
  Filled 2020-06-22: qty 16

## 2020-06-22 MED ORDER — NICOTINE 14 MG/24HR TD PT24
14.0000 mg | MEDICATED_PATCH | Freq: Every day | TRANSDERMAL | Status: DC
  Administered 2020-06-23 – 2020-06-25 (×3): 14 mg via TRANSDERMAL
  Filled 2020-06-22 (×4): qty 1

## 2020-06-22 NOTE — H&P (Addendum)
History and Physical    Jill Shaw Jill Shaw DOB: 08-21-1976 DOA: 06/22/2020  PCP: Vevelyn Francois, NP Consultants:  Kennon Rounds - OB/GYN; Hilts - orthopedics; Icard - pulmonology; Evans - podiatry Patient coming from:  Home - lives with mother and [name] son; NOK: Mother, [phone]  Chief Complaint: SOB  HPI: Jill Shaw is a 44 y.o. female with medical history significant of morbid obesity; tobacco dependence; seizures; HTN; DM; and COPD presenting with SOB.  She reports that she felt her chest was tightening up and she couldn't breathe.  Symptoms started Sunday and worsened that night.  She felt a little better Monday Am but it worsened again that evening.  +LE edema worse than usual.  +orthopnea.  +PND.  Grade 1 diastolic CHF on echo in 11/6201.    ED Course:  SOB likely 2* to CHF.  CXR appears to be consistent with this.  Cardiology not consulted yet.  DVT US ordered for R leg pain.  Review of Systems: As per HPI; otherwise review of systems reviewed and negative.   Ambulatory Status:  Ambulates with a cane > walker  COVID Vaccine Status:  None  Past Medical History:  Diagnosis Date  . Anemia   . Arthritis    knees, hands  . Asthma   . Chronic diastolic (congestive) heart failure ([place])   . COPD (chronic obstructive pulmonary disease) ([place])   . Diabetes mellitus without complication ([place])    type 2  . Dysfunctional uterine bleeding   . GERD (gastroesophageal reflux disease)   . Hypertension   . Neuromuscular disorder (HCC)    neuropathy feet  . Seizures ([place]) 09/12/2017   pt states r/t stress and blood sugar - no meds last one 4 months ago, not seen neurologist  . Sickle cell trait ([place])   . Smoker   . Vitamin D deficiency 10/2019  . Wears glasses     Past Surgical History:  Procedure Laterality Date  . CESAREAN SECTION     x 1. for twins  . DILATION AND CURETTAGE OF UTERUS N/A 08/20/2019   Procedure: DILATATION AND CURETTAGE;  Surgeon: Emily Filbert, MD;   Location: [facility];  Service: Gynecology;  Laterality: N/A;  . ENDOMETRIAL ABLATION N/A 08/20/2019   Procedure: [place] Ablation;  Surgeon: Emily Filbert, MD;  Location: [facility];  Service: Gynecology;  Laterality: N/A;  . EYE SURGERY Bilateral    laser right and cataract removed left eye  . RADIOLOGY WITH ANESTHESIA N/A 09/16/2019   Procedure: MRI WITH ANESTHESIA   L SPINE WITHOUT CONTRAST, T SPINE WITHOUT CONTRAST , CERVICAL WITHOUT CONTRAST;  Surgeon: Radiologist, Medication, MD;  Location: [facility];  Service: Radiology;  Laterality: N/A;  . TUBAL LIGATION     interval BTL  . UPPER GI ENDOSCOPY  07/2017    Social History   Socioeconomic History  . Marital status: Legally Separated    Spouse name: Not on file  . Number of children: Not on file  . Years of education: Not on file  . Highest education level: Not on file  Occupational History  . Occupation: unemployed  Tobacco Use  . Smoking status: Current Every Day Smoker    Packs/day: 0.50    Years: 26.00    Pack years: 13.00    Types: Cigarettes  . Smokeless tobacco: Never Used  . Tobacco comment: 0.5 pack/day, desires patch  Vaping Use  . Vaping Use: Never used  Substance and Sexual Activity  . Alcohol use: No  .  Drug use: No  . Sexual activity: Not Currently    Birth control/protection: None  Other Topics Concern  . Not on file  Social History Narrative  . Not on file   Social Determinants of Health   Financial Resource Strain:   . Difficulty of Paying Living Expenses: Not on file  Food Insecurity: No Food Insecurity  . Worried About Charity fundraiser in the Last Year: Never true  . Ran Out of Food in the Last Year: Never true  Transportation Needs: No Transportation Needs  . Lack of Transportation (Medical): No  . Lack of Transportation (Non-Medical): No  Physical Activity:   . Days of Exercise per Week: Not on file  . Minutes of Exercise per Session: Not on file  Stress:   . Feeling of Stress : Not on file  Social  Connections:   . Frequency of Communication with Friends and Family: Not on file  . Frequency of Social Gatherings with Friends and Family: Not on file  . Attends Religious Services: Not on file  . Active Member of Clubs or Organizations: Not on file  . Attends Archivist Meetings: Not on file  . Marital Status: Not on file  Intimate Partner Violence:   . Fear of Current or Ex-Partner: Not on file  . Emotionally Abused: Not on file  . Physically Abused: Not on file  . Sexually Abused: Not on file    Allergies  Allergen Reactions  . Ketoprofen Nausea And Vomiting  . Aspirin Nausea Only  . Gabapentin Nausea And Vomiting    upset stomach  . Ibuprofen Nausea And Vomiting  . Liraglutide Nausea And Vomiting  . Naproxen Nausea And Vomiting  . Omeprazole-Sodium Bicarbonate Nausea And Vomiting  . Sulfa Antibiotics Nausea And Vomiting  . Tramadol Nausea And Vomiting    stomach upset    Family History  Problem Relation Age of Onset  . Diabetes Mother   . Hypertension Mother     Prior to Admission medications   Medication Sig Start Date End Date Taking? Authorizing Provider  albuterol (PROVENTIL) (2.5 MG/3ML) 0.083% nebulizer solution Take 3 mLs (2.5 mg total) by nebulization every 6 (six) hours as needed for wheezing or shortness of breath. 12/02/19   Vevelyn Francois, NP  albuterol (VENTOLIN HFA) 108 (90 Base) MCG/ACT inhaler Inhale 2 puffs into the lungs every 6 (six) hours as needed for wheezing or shortness of breath. 12/02/19   Vevelyn Francois, NP  blood glucose meter kit and supplies KIT Dispense based on patient and insurance preference. Use up to four times daily as directed. (FOR ICD-9 250.00, 250.01). Patient taking differently: 1 each by Other route See admin instructions. Dispense based on patient and insurance preference. Use up to four times daily as directed. (FOR ICD-9 250.00, 250.01). 02/12/20   Vevelyn Francois, NP  Blood Glucose Monitoring Suppl (TRUE METRIX  METER) w/Device KIT 1 each by Does not apply route 4 (four) times daily -  before meals and at bedtime. 01/21/18   Dorena Dew, FNP  Blood Pressure Monitoring (BLOOD PRESSURE KIT) DEVI Check BP BID prn Patient taking differently: 1 each by Other route 2 (two) times daily as needed (blood pressure).  02/12/20   Vevelyn Francois, NP  budesonide-formoterol (SYMBICORT) 160-4.5 MCG/ACT inhaler INHALE 2 PUFFS INTO THE LUNGS 2 (TWO) TIMES DAILY. Patient taking differently: Inhale 2 puffs into the lungs 2 (two) times daily.  12/02/19   Vevelyn Francois, NP  cetirizine (  ZYRTEC) 10 MG tablet Take 1 tablet (10 mg total) by mouth daily. 12/02/19   Vevelyn Francois, NP  Cholecalciferol (VITAMIN D-3) 125 MCG (5000 UT) TABS Take 1 tablet by mouth daily. Patient taking differently: Take 5,000 Units by mouth daily.  08/22/19   Hilts, Legrand Como, MD  diclofenac (VOLTAREN) 75 MG EC tablet Take 1 tablet (75 mg total) by mouth 2 (two) times daily as needed. 06/07/20   Hilts, Legrand Como, MD  Diclofenac Sodium (PENNSAID) 2 % SOLN Apply QID prn 05/05/20   Hilts, Michael, MD  DULoxetine 40 MG CPEP Take 40 mg by mouth daily. 03/12/20 06/10/20  Vevelyn Francois, NP  ferrous sulfate 325 (65 FE) MG tablet Take 1 tablet (325 mg total) by mouth 3 (three) times daily with meals. 12/02/19   Vevelyn Francois, NP  fluticasone (FLONASE) 50 MCG/ACT nasal spray Place 2 sprays into both nostrils daily. 11/24/19   Vevelyn Francois, NP  furosemide (LASIX) 40 MG tablet Take 1 tablet (40 mg total) by mouth daily. 02/04/20   Thurnell Lose, MD  Glucosamine Sulfate 1000 MG CAPS Take 1 capsule (1,000 mg total) by mouth 2 (two) times daily. 08/22/19   Hilts, Legrand Como, MD  glucose blood (TRUE METRIX BLOOD GLUCOSE TEST) test strip Use as instructed 04/15/20   Vevelyn Francois, NP  hydrocortisone 2.5 % cream Apply topically 2 (two) times daily. 03/12/20   Vevelyn Francois, NP  HYDROmorphone (DILAUDID) 2 MG tablet Take 1-2 tablets (2-4 mg total) by mouth every 6 (six)  hours as needed for severe pain. 05/14/20   Aletha Halim, MD  hydroquinone 4 % cream Apply topically 2 (two) times daily. 05/07/20   Vevelyn Francois, NP  Insulin Lispro Prot & Lispro (HUMALOG MIX 75/25 KWIKPEN) (75-25) 100 UNIT/ML Kwikpen INJECT 100 UNITS EVERY 12 HOURS Patient taking differently: Inject 100 Units into the skin every 12 (twelve) hours.  12/02/19   Vevelyn Francois, NP  Insulin Pen Needle (PEN NEEDLES) 30G X 5 MM MISC 1 Units by Does not apply route as directed. 12/02/19   Vevelyn Francois, NP  Lido-Capsaicin-Men-Methyl Sal 0.5-0.035-5-20 % PTCH Apply 1 patch topically daily. Patient taking differently: Apply 1 patch topically daily as needed (pain).  12/18/19 05/26/20  Vevelyn Francois, NP  losartan (COZAAR) 50 MG tablet Take 1 tablet (50 mg total) by mouth daily. 03/12/20 06/10/20  Vevelyn Francois, NP  metoprolol succinate (TOPROL-XL) 100 MG 24 hr tablet Take 1 tablet (100 mg total) by mouth daily. Take with or immediately following a meal. 12/26/19   Vevelyn Francois, NP  norethindrone (AYGESTIN) 5 MG tablet Take 2 tablets (10 mg total) by mouth in the morning, at noon, in the evening, and at bedtime. 05/26/20   Aletha Halim, MD  omeprazole (PRILOSEC) 40 MG capsule Take 1 capsule (40 mg total) by mouth daily. 12/02/19   Vevelyn Francois, NP  oxyCODONE (ROXICODONE) 5 MG immediate release tablet Take 1-2 tablets (5-10 mg total) by mouth every 6 (six) hours as needed for severe pain. 05/07/20   Sloan Leiter, MD  potassium chloride SA (KLOR-CON) 20 MEQ tablet Take 1 tablet (20 mEq total) by mouth daily. 02/04/20   Thurnell Lose, MD  promethazine (PHENERGAN) 12.5 MG tablet Take 1 tablet (12.5 mg total) by mouth every 6 (six) hours as needed for nausea or vomiting. 03/10/20   Patrecia Pour, MD  promethazine (PHENERGAN) 25 MG tablet Take 1 tablet (25 mg total) by mouth  every 6 (six) hours as needed for nausea or vomiting. 06/11/20   Guss Bunde, MD  rosuvastatin (CRESTOR) 5 MG tablet Take 1  tablet (5 mg total) by mouth daily. 06/03/20   Vevelyn Francois, NP  saxagliptin HCl (ONGLYZA) 2.5 MG TABS tablet Take 1 tablet (2.5 mg total) by mouth daily. 12/02/19   Vevelyn Francois, NP  Tiotropium Bromide Monohydrate (SPIRIVA RESPIMAT) 2.5 MCG/ACT AERS Inhale 2 puffs into the lungs daily. 04/09/20   Martyn Ehrich, NP  tizanidine (ZANAFLEX) 6 MG capsule TAKE 1 CAPSULE (6 MG TOTAL) BY MOUTH 3 (THREE) TIMES DAILY. 05/19/20   Vevelyn Francois, NP  tranexamic acid (LYSTEDA) 650 MG TABS tablet Take 2 tablets (1,300 mg total) by mouth 3 (three) times daily. Take during menses for a maximum of five days 04/23/20   Aletha Halim, MD  TRUEplus Lancets 28G MISC 1 each by Other route in the morning, at noon, in the evening, and at bedtime. 02/12/20   [provider]  Turmeric 500 MG CAPS Take 500 mg by mouth 2 (two) times daily. 08/22/19   Hilts, Legrand Como, MD  Vitamin D, Ergocalciferol, (DRISDOL) 1.25 MG (50000 UNIT) CAPS capsule Take 1 capsule (50,000 Units total) by mouth every 7 (seven) days. Patient taking differently: Take 50,000 Units by mouth every Friday.  12/02/19   Vevelyn Francois, NP  hydrALAZINE (APRESOLINE) 50 MG tablet Take 1 tablet (50 mg total) by mouth 3 (three) times daily. 02/03/20 02/20/20  Thurnell Lose, MD    Physical Exam: Vitals:   06/22/20 0613 06/22/20 0700 06/22/20 0830 06/22/20 1030  BP: (!) 144/81 (!) 151/97 (!) 163/86 127/61  Pulse: 97 100 98 95  Resp: 20 (!) 24 (!) 29 (!) 21  Temp: 98.3 F (36.8 C)     TempSrc: Oral     SpO2: 99% 96% 100% 99%  Weight: 122.5 kg     Height: 5' 4" (1.626 m)        . General:  Appears calm and comfortable and is NAD . Eyes:  PERRL, EOMI, normal lids, iris . ENT:  grossly normal hearing, lips & tongue, mmm . Neck:  no LAD, masses or thyromegaly . Cardiovascular:  RRR, no m/r/g. Tr LE edema.  Marland Kitchen Respiratory:   LLL crackles.  Normal respiratory effort, on RA. Marland Kitchen Abdomen:  soft, NT, ND, morbidly obese . Skin:  no rash or  induration seen on limited exam . Musculoskeletal:  grossly normal tone BUE/BLE, good ROM, no bony abnormality . Psychiatric:  grossly normal mood and affect, speech fluent and appropriate, AOx3 . Neurologic:  CN 2-12 grossly intact, moves all extremities in coordinated fashion    Radiological Exams on Admission: DG Chest Port 1 View  Result Date: 06/22/2020 CLINICAL DATA:  Shortness of breath. EXAM: PORTABLE CHEST 1 VIEW COMPARISON:  CT 03/08/2020.  Chest x-ray 03/08/2020. FINDINGS: Cardiomegaly with diffuse bilateral pulmonary interstitial prominence suggesting CHF. Pneumonitis cannot be excluded. No prominent pleural effusion. No pneumothorax. IMPRESSION: Cardiomegaly with diffuse bilateral pulmonary interstitial prominence suggesting CHF. Pneumonitis cannot be excluded. Electronically Signed   By: Marcello Moores  Register   On: 06/22/2020 06:47   VAS [place] LOWER EXTREMITY VENOUS (DVT) (ONLY [facility] & [facility])  Result Date: 06/22/2020  Lower Venous DVTStudy Indications: Edema.  Comparison Study: No prior studies. Performing Technologist: Darlin Coco  Examination Guidelines: A complete evaluation includes B-mode imaging, spectral Doppler, color Doppler, and power Doppler as needed of all accessible portions of each vessel. Bilateral testing is considered  an integral part of a complete examination. Limited examinations for reoccurring indications may be performed as noted. The reflux portion of the exam is performed with the patient in reverse Trendelenburg.  +---------+---------------+---------+-----------+----------+-------------------+ RIGHT    CompressibilityPhasicitySpontaneityPropertiesThrombus Aging      +---------+---------------+---------+-----------+----------+-------------------+ CFV      Full           Yes      Yes                                      +---------+---------------+---------+-----------+----------+-------------------+ SFJ      Full                                                              +---------+---------------+---------+-----------+----------+-------------------+ FV Prox  Full                                                             +---------+---------------+---------+-----------+----------+-------------------+ FV Mid   Full                                                             +---------+---------------+---------+-----------+----------+-------------------+ FV DistalFull                                                             +---------+---------------+---------+-----------+----------+-------------------+ PFV      Full                                                             +---------+---------------+---------+-----------+----------+-------------------+ POP      Full           Yes      Yes                                      +---------+---------------+---------+-----------+----------+-------------------+ PTV      Full                                                             +---------+---------------+---------+-----------+----------+-------------------+ PERO     Full  Some segments not                                                         visualized.         +---------+---------------+---------+-----------+----------+-------------------+   +----+---------------+---------+-----------+----------+--------------+ LEFTCompressibilityPhasicitySpontaneityPropertiesThrombus Aging +----+---------------+---------+-----------+----------+--------------+ CFV Full           Yes      Yes                                 +----+---------------+---------+-----------+----------+--------------+     Summary: RIGHT: - There is no evidence of deep vein thrombosis in the lower extremity.  - No cystic structure found in the popliteal fossa.   *See table(s) above for measurements and observations.    Preliminary     EKG: Independently reviewed.  Sinus tachycardia with rate  100; nonspecific ST changes with no evidence of acute ischemia   Labs on Admission: I have personally reviewed the available labs and imaging studies at the time of the admission.  Pertinent labs:   Glucose 178 BUN 8/Creatinine 1.17/GFR >60 - stable BNP 266.3 HS troponin 103, 86 WBC 12.4 Hgb 8.3; 9.2 on 8/18 COVID negative   Assessment/Plan Principal Problem:   Acute on chronic diastolic (congestive) heart failure (HCC) Active Problems:   Type 2 diabetes mellitus without complication, with long-term current use of insulin (HCC)   Essential hypertension   Morbid obesity (HCC)   Tobacco dependence   Seizure (HCC)   Chronic obstructive pulmonary disease (HCC)   Chronic pain    Acute on chronic diastolic CHF -Patient with known h/o chronic diastolic CHF presenting with worsening SOB  -Recent echo (04/2020) with grade 1 diastolic dysfunction -CXR consistent with mild pulmonary edema -Mildly elevated BNP, similar to prior -With elevated BNP and abnl CXR, acute decompensated CHF seems probable as diagnosis -Will place in observation status with telemetry, as there are no current findings necessitating admission (hemodynamic instability, severe electrolyte abnormalities, cardiac arrhythmias, ACS, severe pulmonary edema requiring new O2 therapy, AMS) -Will start 81 mg ASA -Will continue ARB, BB -In order to facilitate possible transition to [facility] (during hospitalization or in the future), she is already on ARB therapy. -CHF order set utilized -Was given Lasix 40 mg x 1 in ER and will repeat with 40 mg IV BID -Can give [place] O2 if needed -Stable kidney function at this time, will follow -Repeat EKG in AM -Mildly elevated HS troponin is likely related to demand ischemia; doubt ACS based on symptoms  HTN -Continue home BP meds (Cozaar, Toprol XL)   HLD -Continue Crestor -Lipids were checked on 8/18 (TC 135, HDL 29, LDL 77, TG 166) so will not repeat at this time  DM -Recent  A1c was 9.0 on 8/18, indicating poor control -Continue high-dose 75/25 -Hold Onglyza -Cover with resistant-scale SSI  Chronic pain -Interestingly, while she lists Norco as a current medication, PDMP review indicates that she is not receiving chronic controled substances as an outpatient -Continue Cymbalta, Norco for now  COPD -Continue Symbicort, Spiriva, Zyrtec, Flonase, and prn Albuterol  Seizure d/o -She does not appear to be taking anti-seizure medication at this time  Tobacco dependence -Encourage cessation.   -This was discussed with the patient and should be reviewed on an ongoing basis.   -  Patch requested  Morbid obesity -Body mass index is 46.35 kg/m.  -Weight loss should be encouraged -Outpatient PCP/bariatric medicine/bariatric surgery f/u encouraged -Hold norethindrone - with her weight, this medication increases her risk of VTE     Note: This patient has been tested and is negative for the novel coronavirus COVID-19.   DVT prophylaxis: Lovenox  Code Status:  DNR - confirmed with patient Family Communication: None present Disposition Plan:  The patient is from: home  Anticipated d/c is to: home, possible with [facility] services  Anticipated d/c date will depend on clinical response to treatment, likely 2-4 days  Patient is currently: acutely ill Consults called: Cardiology; [facility] team; PT Admission status: Admit - It is my clinical opinion that admission to INPATIENT is reasonable and necessary because this patient will require at least 2 midnights in the hospital to treat this condition based on the medical complexity of the problems presented.  Given the aforementioned information, the predictability of an adverse outcome is felt to be significant.    Karmen Bongo MD [facility]   How to contact the [facility] Attending or Consulting provider [place] or covering provider during after hours [place], for this patient?  1. Check the care team in [facility] and look  for a) attending/consulting [facility] provider listed and b) the [facility] team listed 2. Log into [email or web address] and use [place]'s universal password to access. If you do not have the password, please contact the hospital operator. 3. Locate the [facility] provider you are looking for under [facility] and page to a number that you can be directly reached. 4. If you still have difficulty reaching the provider, please page the [facility] (Director on Call) for the Hospitalists listed on amion for assistance.   06/22/2020, 11:57 AM

## 2020-06-22 NOTE — ED Provider Notes (Signed)
  Physical Exam  BP (!) 144/81 (BP Location: Left Arm)   Pulse 97   Temp 98.3 F (36.8 C) (Oral)   Resp 20   Ht 5\' 4"  (1.626 m)   Wt 122.5 kg   SpO2 99%   BMI 46.35 kg/m   Physical Exam  ED Course/Procedures     Procedures  MDM  Presents with dyspnea.  DDx includes COVID 19, COPD, CHF, pneumonia. CXR concerning for CHF. COVID testing negative. Troponin elevated, likely secondary to stress related to CHF. Has right calf pain-will check DVT study, however at this time suspect more likely etiology of dypsnea is CHF and do not feel further CT imaging indicated unless DVT study positive. Given lasix. Will admit for further care    , MD 06/22/20 2233

## 2020-06-22 NOTE — ED Provider Notes (Signed)
[facility] EMERGENCY DEPARTMENT Provider Note   CSN: [id number] Arrival date & time: 06/22/20  2536     History Chief Complaint  Patient presents with  . Shortness of Breath    Jill Shaw is a 44 y.o. female.  Patient presents to the emergency department for evaluation of shortness of breath.  Patient reports that she started having difficulty breathing 2 days ago.  She has not had any fever, cough or chest congestion.  Overnight her breathing worsened.  She has noticed some fluid retention in her legs but that happens from time to time.  She takes Lasix as needed for her leg swelling.  She has never had shortness of breath in conjunction with the leg swelling.  She has not experiencing any chest pain.  She has not been vaccinated for Covid.        Past Medical History:  Diagnosis Date  . Anemia   . Arthritis    knees, hands  . Asthma   . COPD (chronic obstructive pulmonary disease) ([place])   . Diabetes mellitus without complication ([place])    type 2  . Dysfunctional uterine bleeding   . GERD (gastroesophageal reflux disease)   . Hypertension   . Neuromuscular disorder (HCC)    neuropathy feet  . Seizures ([place]) 09/12/2017   pt states r/t stress and blood sugar - no meds last one 4 months ago, not seen neurologist  . Sickle cell trait ([place])   . Smoker   . Vitamin D deficiency 10/2019  . Wears glasses     Patient Active Problem List   Diagnosis Date Noted  . Abdominal pain 03/08/2020  . Chronic diastolic CHF (congestive heart failure) ([place]) 03/08/2020  . Acute encephalopathy 01/29/2020  . AKI (acute kidney injury) ([place]) 01/29/2020  . Symptomatic anemia 12/19/2019  . Elevated sed rate 11/26/2019  . Elevated C-reactive protein (CRP) 11/26/2019  . Hypoglycemia 02/07/2019  . Hypotension 02/07/2019  . Lactic acidosis 02/07/2019  . Right ankle pain 02/07/2019  . Chronic obstructive pulmonary disease ([place]) 02/05/2019  . Seizure ([place]) 07/31/2018  .  Vitamin D deficiency 05/31/2017  . Gastroesophageal reflux disease 05/24/2017  . Abnormal uterine bleeding (AUB) 05/17/2017  . Anemia of chronic disease 04/06/2017  . Knee pain, chronic 03/27/2016  . Type 2 diabetes mellitus without complication, with long-term current use of insulin ([place]) 03/27/2016  . Essential hypertension 03/27/2016  . Morbid obesity ([place]) 03/27/2016  . Irritable bowel syndrome with constipation 03/27/2016  . Tobacco dependence 03/27/2016  . CKD (chronic kidney disease) stage 2, GFR 60-89 ml/min 03/25/2016  . Arthropathy, lower leg 05/04/2013  . CTS (carpal tunnel syndrome) 05/04/2013  . Peripheral edema 05/04/2013  . Chronic pain 05/13/2012  . Diabetic gastroparesis ([place]) 11/06/2006  . Hot flashes 11/06/2006    Past Surgical History:  Procedure Laterality Date  . CESAREAN SECTION     x 1. for twins  . DILATION AND CURETTAGE OF UTERUS N/A 08/20/2019   Procedure: DILATATION AND CURETTAGE;  Surgeon: Emily Filbert, MD;  Location: [facility];  Service: Gynecology;  Laterality: N/A;  . ENDOMETRIAL ABLATION N/A 08/20/2019   Procedure: [place] Ablation;  Surgeon: Emily Filbert, MD;  Location: [facility];  Service: Gynecology;  Laterality: N/A;  . EYE SURGERY Bilateral    laser right and cataract removed left eye  . RADIOLOGY WITH ANESTHESIA N/A 09/16/2019   Procedure: MRI WITH ANESTHESIA   L SPINE WITHOUT CONTRAST, T SPINE WITHOUT CONTRAST , CERVICAL WITHOUT CONTRAST;  Surgeon: Radiologist, Medication, MD;  Location: [place];  Service: Radiology;  Laterality: N/A;  . TUBAL LIGATION     interval BTL  . UPPER GI ENDOSCOPY  07/2017     OB History    Gravida  3   Para      Term      Preterm      AB      Living  3     SAB      TAB      Ectopic      Multiple      Live Births  3        Obstetric Comments  Vaginal x 1 and then c-section for twins        Family History  Problem Relation Age of Onset  . Diabetes Mother   . Hypertension Mother     Social  History   Tobacco Use  . Smoking status: Current Every Day Smoker    Packs/day: 0.50    Years: 26.00    Pack years: 13.00    Types: Cigarettes  . Smokeless tobacco: Never Used  . Tobacco comment: 0.5 pack/day  Vaping Use  . Vaping Use: Never used  Substance Use Topics  . Alcohol use: No  . Drug use: No    Home Medications Prior to Admission medications   Medication Sig Start Date End Date Taking? Authorizing Provider  albuterol (PROVENTIL) (2.5 MG/3ML) 0.083% nebulizer solution Take 3 mLs (2.5 mg total) by nebulization every 6 (six) hours as needed for wheezing or shortness of breath. 12/02/19   Vevelyn Francois, NP  albuterol (VENTOLIN HFA) 108 (90 Base) MCG/ACT inhaler Inhale 2 puffs into the lungs every 6 (six) hours as needed for wheezing or shortness of breath. 12/02/19   Vevelyn Francois, NP  blood glucose meter kit and supplies KIT Dispense based on patient and insurance preference. Use up to four times daily as directed. (FOR ICD-9 250.00, 250.01). Patient taking differently: 1 each by Other route See admin instructions. Dispense based on patient and insurance preference. Use up to four times daily as directed. (FOR ICD-9 250.00, 250.01). 02/12/20   Vevelyn Francois, NP  Blood Glucose Monitoring Suppl (TRUE METRIX METER) w/Device KIT 1 each by Does not apply route 4 (four) times daily -  before meals and at bedtime. 01/21/18   Dorena Dew, FNP  Blood Pressure Monitoring (BLOOD PRESSURE KIT) DEVI Check BP BID prn Patient taking differently: 1 each by Other route 2 (two) times daily as needed (blood pressure).  02/12/20   Vevelyn Francois, NP  budesonide-formoterol (SYMBICORT) 160-4.5 MCG/ACT inhaler INHALE 2 PUFFS INTO THE LUNGS 2 (TWO) TIMES DAILY. Patient taking differently: Inhale 2 puffs into the lungs 2 (two) times daily.  12/02/19   Vevelyn Francois, NP  cetirizine (ZYRTEC) 10 MG tablet Take 1 tablet (10 mg total) by mouth daily. 12/02/19   Vevelyn Francois, NP  Cholecalciferol  (VITAMIN D-3) 125 MCG (5000 UT) TABS Take 1 tablet by mouth daily. Patient taking differently: Take 5,000 Units by mouth daily.  08/22/19   Hilts, Legrand Como, MD  diclofenac (VOLTAREN) 75 MG EC tablet Take 1 tablet (75 mg total) by mouth 2 (two) times daily as needed. 06/07/20   Hilts, Legrand Como, MD  Diclofenac Sodium (PENNSAID) 2 % SOLN Apply QID prn 05/05/20   Hilts, Michael, MD  DULoxetine 40 MG CPEP Take 40 mg by mouth daily. 03/12/20 06/10/20  Vevelyn Francois, NP  ferrous sulfate  325 (65 FE) MG tablet Take 1 tablet (325 mg total) by mouth 3 (three) times daily with meals. 12/02/19   Vevelyn Francois, NP  fluticasone (FLONASE) 50 MCG/ACT nasal spray Place 2 sprays into both nostrils daily. 11/24/19   Vevelyn Francois, NP  furosemide (LASIX) 40 MG tablet Take 1 tablet (40 mg total) by mouth daily. 02/04/20   Thurnell Lose, MD  Glucosamine Sulfate 1000 MG CAPS Take 1 capsule (1,000 mg total) by mouth 2 (two) times daily. 08/22/19   Hilts, Legrand Como, MD  glucose blood (TRUE METRIX BLOOD GLUCOSE TEST) test strip Use as instructed 04/15/20   Vevelyn Francois, NP  hydrocortisone 2.5 % cream Apply topically 2 (two) times daily. 03/12/20   Vevelyn Francois, NP  HYDROmorphone (DILAUDID) 2 MG tablet Take 1-2 tablets (2-4 mg total) by mouth every 6 (six) hours as needed for severe pain. 05/14/20   Aletha Halim, MD  hydroquinone 4 % cream Apply topically 2 (two) times daily. 05/07/20   Vevelyn Francois, NP  Insulin Lispro Prot & Lispro (HUMALOG MIX 75/25 KWIKPEN) (75-25) 100 UNIT/ML Kwikpen INJECT 100 UNITS EVERY 12 HOURS Patient taking differently: Inject 100 Units into the skin every 12 (twelve) hours.  12/02/19   Vevelyn Francois, NP  Insulin Pen Needle (PEN NEEDLES) 30G X 5 MM MISC 1 Units by Does not apply route as directed. 12/02/19   Vevelyn Francois, NP  Lido-Capsaicin-Men-Methyl Sal 0.5-0.035-5-20 % PTCH Apply 1 patch topically daily. Patient taking differently: Apply 1 patch topically daily as needed (pain).  12/18/19  05/26/20  Vevelyn Francois, NP  losartan (COZAAR) 50 MG tablet Take 1 tablet (50 mg total) by mouth daily. 03/12/20 06/10/20  Vevelyn Francois, NP  metoprolol succinate (TOPROL-XL) 100 MG 24 hr tablet Take 1 tablet (100 mg total) by mouth daily. Take with or immediately following a meal. 12/26/19   Vevelyn Francois, NP  norethindrone (AYGESTIN) 5 MG tablet Take 2 tablets (10 mg total) by mouth in the morning, at noon, in the evening, and at bedtime. 05/26/20   Aletha Halim, MD  omeprazole (PRILOSEC) 40 MG capsule Take 1 capsule (40 mg total) by mouth daily. 12/02/19   Vevelyn Francois, NP  oxyCODONE (ROXICODONE) 5 MG immediate release tablet Take 1-2 tablets (5-10 mg total) by mouth every 6 (six) hours as needed for severe pain. 05/07/20   Sloan Leiter, MD  potassium chloride SA (KLOR-CON) 20 MEQ tablet Take 1 tablet (20 mEq total) by mouth daily. 02/04/20   Thurnell Lose, MD  promethazine (PHENERGAN) 12.5 MG tablet Take 1 tablet (12.5 mg total) by mouth every 6 (six) hours as needed for nausea or vomiting. 03/10/20   Patrecia Pour, MD  promethazine (PHENERGAN) 25 MG tablet Take 1 tablet (25 mg total) by mouth every 6 (six) hours as needed for nausea or vomiting. 06/11/20   Guss Bunde, MD  rosuvastatin (CRESTOR) 5 MG tablet Take 1 tablet (5 mg total) by mouth daily. 06/03/20   Vevelyn Francois, NP  saxagliptin HCl (ONGLYZA) 2.5 MG TABS tablet Take 1 tablet (2.5 mg total) by mouth daily. 12/02/19   Vevelyn Francois, NP  Tiotropium Bromide Monohydrate (SPIRIVA RESPIMAT) 2.5 MCG/ACT AERS Inhale 2 puffs into the lungs daily. 04/09/20   Martyn Ehrich, NP  tizanidine (ZANAFLEX) 6 MG capsule TAKE 1 CAPSULE (6 MG TOTAL) BY MOUTH 3 (THREE) TIMES DAILY. 05/19/20   Vevelyn Francois, NP  tranexamic acid (LYSTEDA)  650 MG TABS tablet Take 2 tablets (1,300 mg total) by mouth 3 (three) times daily. Take during menses for a maximum of five days 04/23/20   Aletha Halim, MD  TRUEplus Lancets 28G MISC 1 each by Other  route in the morning, at noon, in the evening, and at bedtime. 02/12/20   [provider]  Turmeric 500 MG CAPS Take 500 mg by mouth 2 (two) times daily. 08/22/19   Hilts, Legrand Como, MD  Vitamin D, Ergocalciferol, (DRISDOL) 1.25 MG (50000 UNIT) CAPS capsule Take 1 capsule (50,000 Units total) by mouth every 7 (seven) days. Patient taking differently: Take 50,000 Units by mouth every Friday.  12/02/19   Vevelyn Francois, NP  hydrALAZINE (APRESOLINE) 50 MG tablet Take 1 tablet (50 mg total) by mouth 3 (three) times daily. 02/03/20 02/20/20  Thurnell Lose, MD    Allergies    Ketoprofen, Aspirin, Gabapentin, Ibuprofen, Liraglutide, Naproxen, Omeprazole-sodium bicarbonate, Sulfa antibiotics, and Tramadol  Review of Systems   Review of Systems  Respiratory: Positive for shortness of breath.   All other systems reviewed and are negative.   Physical Exam Updated Vital Signs BP (!) 144/81 (BP Location: Left Arm)   Pulse 97   Temp 98.3 F (36.8 C) (Oral)   Resp 20   Ht $R'5\' 4"'to$  (1.626 m)   Wt 122.5 kg   SpO2 99%   BMI 46.35 kg/m   Physical Exam Vitals and nursing note reviewed.  Constitutional:      General: She is not in acute distress.    Appearance: Normal appearance. She is well-developed.  HENT:     Head: Normocephalic and atraumatic.     Right Ear: Hearing normal.     Left Ear: Hearing normal.     Nose: Nose normal.  Eyes:     Conjunctiva/sclera: Conjunctivae normal.     Pupils: Pupils are equal, round, and reactive to light.  Cardiovascular:     Rate and Rhythm: Regular rhythm.     Heart sounds: S1 normal and S2 normal. No murmur heard.  No friction rub. No gallop.   Pulmonary:     Effort: Tachypnea and accessory muscle usage present. No respiratory distress.     Breath sounds: Decreased breath sounds present.  Chest:     Chest wall: No tenderness.  Abdominal:     General: Bowel sounds are normal.     Palpations: Abdomen is soft.     Tenderness: There is no  abdominal tenderness. There is no guarding or rebound. Negative signs include Murphy's sign and McBurney's sign.     Hernia: No hernia is present.  Musculoskeletal:        General: Normal range of motion.     Cervical back: Normal range of motion and neck supple.  Skin:    General: Skin is warm and dry.     Findings: No rash.  Neurological:     Mental Status: She is alert and oriented to person, place, and time.     GCS: GCS eye subscore is 4. GCS verbal subscore is 5. GCS motor subscore is 6.     Cranial Nerves: No cranial nerve deficit.     Sensory: No sensory deficit.     Coordination: Coordination normal.  Psychiatric:        Speech: Speech normal.        Behavior: Behavior normal.        Thought Content: Thought content normal.     ED Results / Procedures / Treatments  Labs (all labs ordered are listed, but only abnormal results are displayed) Labs Reviewed  SARS CORONAVIRUS 2 BY RT PCR (HOSPITAL ORDER, [facility])  CBC WITH DIFFERENTIAL/PLATELET  BASIC METABOLIC PANEL  BRAIN NATRIURETIC PEPTIDE  TROPONIN I (HIGH SENSITIVITY)    EKG EKG Interpretation  Date/Time:  Tuesday June 22 2020 06:11:41 EDT Ventricular Rate:  100 PR Interval:    QRS Duration: 83 QT Interval:  371 QTC Calculation: 479 R Axis:   66 Text Interpretation: Sinus tachycardia Borderline T abnormalities, inferior leads Baseline wander in lead(s) V4 Confirmed by Orpah Greek [phone]) on 06/22/2020 6:13:13 AM   Radiology No results found.  Procedures Procedures (including critical care time)  Medications Ordered in ED Medications - No data to display  ED Course  I have reviewed the triage vital signs and the nursing notes.  Pertinent labs & imaging results that were available during my care of the patient were reviewed by me and considered in my medical decision making (see chart for details).    MDM Rules/Calculators/A&P                           Patient with history of COPD presents to the emergency department with progressively worsening shortness of breath over a period of 2 days.  She was given albuterol, Atrovent and Solu-Medrol by EMS during transport.  She still is exhibiting decreased air movement with increased work of breathing but her oxygen saturations are normal at arrival.  Will check Covid status.  Patient experiencing 1-2+ pitting edema of bilateral lower extremities.  It sounds as though she has some issues with chronic peripheral edema, will check for possibility of CHF. Will sign out to oncoming ER physician to follow work up and determine disposition.  Final Clinical Impression(s) / ED Diagnoses Final diagnoses:  Shortness of breath    Rx / DC Orders ED Discharge Orders    None       Orpah Greek, MD 06/22/20 [phone]

## 2020-06-22 NOTE — Progress Notes (Signed)
Lower extremity venous RT study completed.   Please see CV Proc for preliminary results.   Denijah Karrer  

## 2020-06-22 NOTE — ED Triage Notes (Signed)
Pt arrived via ems with complaints of shob that worsened over night. Pt has a hx of copd and asthma. Received 125 solu-medrol and a duo neb with ems

## 2020-06-23 ENCOUNTER — Ambulatory Visit: Payer: Medicaid Other | Admitting: Physical Therapy

## 2020-06-23 DIAGNOSIS — Z20822 Contact with and (suspected) exposure to covid-19: Secondary | ICD-10-CM | POA: Diagnosis present

## 2020-06-23 DIAGNOSIS — I5033 Acute on chronic diastolic (congestive) heart failure: Secondary | ICD-10-CM | POA: Diagnosis present

## 2020-06-23 DIAGNOSIS — E119 Type 2 diabetes mellitus without complications: Secondary | ICD-10-CM

## 2020-06-23 DIAGNOSIS — Z6841 Body Mass Index (BMI) 40.0 and over, adult: Secondary | ICD-10-CM | POA: Diagnosis not present

## 2020-06-23 DIAGNOSIS — I110 Hypertensive heart disease with heart failure: Secondary | ICD-10-CM | POA: Diagnosis present

## 2020-06-23 DIAGNOSIS — J449 Chronic obstructive pulmonary disease, unspecified: Secondary | ICD-10-CM | POA: Diagnosis present

## 2020-06-23 DIAGNOSIS — D573 Sickle-cell trait: Secondary | ICD-10-CM | POA: Diagnosis present

## 2020-06-23 DIAGNOSIS — Z794 Long term (current) use of insulin: Secondary | ICD-10-CM | POA: Diagnosis not present

## 2020-06-23 DIAGNOSIS — I248 Other forms of acute ischemic heart disease: Secondary | ICD-10-CM | POA: Diagnosis present

## 2020-06-23 DIAGNOSIS — Z7951 Long term (current) use of inhaled steroids: Secondary | ICD-10-CM | POA: Diagnosis not present

## 2020-06-23 DIAGNOSIS — F17210 Nicotine dependence, cigarettes, uncomplicated: Secondary | ICD-10-CM | POA: Diagnosis present

## 2020-06-23 DIAGNOSIS — K219 Gastro-esophageal reflux disease without esophagitis: Secondary | ICD-10-CM | POA: Diagnosis present

## 2020-06-23 DIAGNOSIS — R0602 Shortness of breath: Secondary | ICD-10-CM | POA: Diagnosis present

## 2020-06-23 DIAGNOSIS — Z66 Do not resuscitate: Secondary | ICD-10-CM | POA: Diagnosis present

## 2020-06-23 DIAGNOSIS — I10 Essential (primary) hypertension: Secondary | ICD-10-CM

## 2020-06-23 DIAGNOSIS — Z79899 Other long term (current) drug therapy: Secondary | ICD-10-CM | POA: Diagnosis not present

## 2020-06-23 DIAGNOSIS — G8929 Other chronic pain: Secondary | ICD-10-CM | POA: Diagnosis present

## 2020-06-23 DIAGNOSIS — G40909 Epilepsy, unspecified, not intractable, without status epilepticus: Secondary | ICD-10-CM | POA: Diagnosis present

## 2020-06-23 DIAGNOSIS — F17200 Nicotine dependence, unspecified, uncomplicated: Secondary | ICD-10-CM

## 2020-06-23 DIAGNOSIS — E1140 Type 2 diabetes mellitus with diabetic neuropathy, unspecified: Secondary | ICD-10-CM | POA: Diagnosis present

## 2020-06-23 DIAGNOSIS — E785 Hyperlipidemia, unspecified: Secondary | ICD-10-CM | POA: Diagnosis present

## 2020-06-23 LAB — GLUCOSE, CAPILLARY
Glucose-Capillary: 249 mg/dL — ABNORMAL HIGH (ref 70–99)
Glucose-Capillary: 267 mg/dL — ABNORMAL HIGH (ref 70–99)
Glucose-Capillary: 335 mg/dL — ABNORMAL HIGH (ref 70–99)
Glucose-Capillary: 365 mg/dL — ABNORMAL HIGH (ref 70–99)
Glucose-Capillary: 42 mg/dL — CL (ref 70–99)
Glucose-Capillary: 77 mg/dL (ref 70–99)

## 2020-06-23 LAB — BASIC METABOLIC PANEL
Anion gap: 9 (ref 5–15)
BUN: 19 mg/dL (ref 6–20)
CO2: 27 mmol/L (ref 22–32)
Calcium: 9 mg/dL (ref 8.9–10.3)
Chloride: 100 mmol/L (ref 98–111)
Creatinine, Ser: 1.25 mg/dL — ABNORMAL HIGH (ref 0.44–1.00)
GFR calc Af Amer: >60 mL/min (ref >60)
GFR calc non Af Amer: 53 mL/min — ABNORMAL LOW (ref >60)
Glucose, Bld: 257 mg/dL — ABNORMAL HIGH (ref 70–99)
Potassium: 4.5 mmol/L (ref 3.5–5.1)
Sodium: 136 mmol/L (ref 135–145)

## 2020-06-23 LAB — CBC WITH DIFFERENTIAL/PLATELET
Abs Immature Granulocytes: 0.08 10*3/uL — ABNORMAL HIGH (ref 0.00–0.07)
Basophils Absolute: 0 10*3/uL (ref 0.0–0.1)
Basophils Relative: 0 %
Eosinophils Absolute: 0 10*3/uL (ref 0.0–0.5)
Eosinophils Relative: 0 %
HCT: 28.3 % — ABNORMAL LOW (ref 36.0–46.0)
Hemoglobin: 8.2 g/dL — ABNORMAL LOW (ref 12.0–15.0)
Immature Granulocytes: 1 %
Lymphocytes Relative: 15 %
Lymphs Abs: 2.1 10*3/uL (ref 0.7–4.0)
MCH: 19.6 pg — ABNORMAL LOW (ref 26.0–34.0)
MCHC: 29 g/dL — ABNORMAL LOW (ref 30.0–36.0)
MCV: 67.5 fL — ABNORMAL LOW (ref 80.0–100.0)
Monocytes Absolute: 0.9 10*3/uL (ref 0.1–1.0)
Monocytes Relative: 6 %
Neutro Abs: 10.8 10*3/uL — ABNORMAL HIGH (ref 1.7–7.7)
Neutrophils Relative %: 78 %
Platelets: 258 10*3/uL (ref 150–400)
RBC: 4.19 MIL/uL (ref 3.87–5.11)
RDW: 23.4 % — ABNORMAL HIGH (ref 11.5–15.5)
WBC: 13.9 10*3/uL — ABNORMAL HIGH (ref 4.0–10.5)
nRBC: 0 % (ref 0.0–0.2)

## 2020-06-23 MED ORDER — GLUCOSE 40 % PO GEL
2.0000 | ORAL | Status: AC
  Administered 2020-06-23: 75 g via ORAL
  Filled 2020-06-23: qty 2

## 2020-06-23 MED ORDER — FUROSEMIDE 10 MG/ML IJ SOLN
80.0000 mg | Freq: Two times a day (BID) | INTRAMUSCULAR | Status: DC
  Administered 2020-06-23 – 2020-06-24 (×3): 80 mg via INTRAVENOUS
  Filled 2020-06-23 (×3): qty 8

## 2020-06-23 NOTE — Progress Notes (Addendum)
PROGRESS NOTE    Jill Shaw  QIW:[id number] DOB: 06-28-1976 DOA: 06/22/2020 PCP: Barbette Merino, NP   Brief Narrative:  Jill Shaw is a 44 y.o. female with medical history significant of morbid obesity; tobacco dependence; seizures; HTN; DM; and COPD presenting with progressive shortness of breath.  She reports that she felt her chest was tightening up and she couldn't breathe. She reports  +LE edema worse than usual.  +orthopnea.  +PND.  Grade 1 diastolic CHF on echo in 04/2020.SOB likely 2* to CHF.  CXR appears to be consistent with this CHF. She is admitted for CHF exacerbation.  Echocardiogram recently completed.  She continues to have significant shortness of breath,  Cardiology consulted. Venous duplex right LE : No DVT.    Assessment & Plan:   Principal Problem:   Acute on chronic diastolic (congestive) heart failure (HCC) Active Problems:   Type 2 diabetes mellitus without complication, with long-term current use of insulin (HCC)   Essential hypertension   Morbid obesity (HCC)   Tobacco dependence   Seizure (HCC)   Chronic obstructive pulmonary disease (HCC)   Chronic pain   Acute on chronic diastolic CHF -Patient with known h/o chronic diastolic CHF presenting with worsening SOB  -Recent echo (04/2020) with grade 1 diastolic dysfunction -CXR consistent with mild pulmonary edema -Mildly elevated BNP, similar to prior -With elevated BNP and abnl CXR, acute decompensated CHF seems probable as diagnosis. -Continue 81 mg ASA -continue ARB, BB -CHF order set utilized -Was given Lasix 40 mg x 1 in ER and will repeat with 40 mg IV BID -Can give [place] O2 if needed. -Serum creatinine trending up. Recheck in am. -Mildly elevated HS troponin is likely related to demand ischemia; doubt ACS based on symptoms -Cardio consulted,  will follow up recommendation.  HTN -Continue home BP meds (Cozaar, Toprol XL) .   HLD -Continue Crestor -Lipids were checked on 8/18 (TC 135,  HDL 29, LDL 77, TG 166) so will not repeat at this time  DM -Recent A1c was 9.0 on 8/18, indicating poor control -Continue high-dose insulin 75/25. -Hold Onglyza -Cover withresistant-scale SSI  Chronic pain -Interestingly, while she lists Norco as a current medication,   PDMP review indicates that she is not receiving chronic controled substances as an outpatient -Continue Cymbalta, Norco for now  COPD -Continue Symbicort, Spiriva, Zyrtec, Flonase, and prn Albuterol  Seizure d/o -She does not appear to be taking anti-seizure medication at this time  Tobacco dependence -Encourage cessation.  -This was discussed with the patient and should be reviewed on an ongoing basis.  -Patchrequested  Morbid obesity -Body mass index is 46.35 kg/m. -Weight loss should be encouraged -Outpatient PCP/bariatric medicine/bariatric surgery f/u encouraged -Hold norethindrone - with her weight, this medication increases her risk of VTE    DVT prophylaxis:Lovenox. Code Status: Full Family Communication: No family at bed side.  Disposition Plan:  The patient is from: home             Anticipated d/c is to: home, possible with [facility] services.             Anticipated d/c date will depend on clinical response to treatment, likely 2-4 days             Patient is currently: acutely ill  Consultants:    Cardiology  Procedures:  Antimicrobials:  Anti-infectives (From admission, onward)   None      Subjective: Patient was seen and examined at bedside.  Overnight events  noted.   She was sitting on the bed but still complains of shortness of breath on mild exertion.  she desats while having a PT session.  Objective: Vitals:   06/23/20 0435 06/23/20 0750 06/23/20 0752 06/23/20 1149  BP: (!) 143/86  118/68 (!) 143/81  Pulse: 73  76 67  Resp: 17  20 18   Temp: 98.6 F (37 C)  99 F (37.2 C)   TempSrc: Oral  Oral   SpO2: 98% 96% 94% 99%  Weight:      Height:         Intake/Output Summary (Last 24 hours) at 06/23/2020 1459 Last data filed at 06/23/2020 0900 Gross per 24 hour  Intake 840 ml  Output --  Net 840 ml   Filed Weights   06/22/20 0613 06/22/20 1606 06/23/20 0106  Weight: 122.5 kg 125.5 kg 124.6 kg    Examination:  General exam: Appears calm and comfortable  Respiratory system: Clear to auscultation. Respiratory effort normal. Cardiovascular system: S1 & S2 heard, RRR. No JVD, murmurs, rubs, gallops or clicks. No pedal edema. Gastrointestinal system: Abdomen is nondistended, soft and nontender. No organomegaly or masses felt. Normal bowel sounds heard. Central nervous system: Alert and oriented. No focal neurological deficits. Extremities:  No clubbing, no cyanosis, peripheral pulses noted. Skin: No rashes, lesions or ulcers Psychiatry: Judgement and insight appear normal. Mood & affect appropriate.     Data Reviewed: I have personally reviewed following labs and imaging studies  CBC: Recent Labs  Lab 06/22/20 0636 06/23/20 0702  WBC 12.4* 13.9*  NEUTROABS 8.4* 10.8*  HGB 8.3* 8.2*  HCT 29.3* 28.3*  MCV 69.4* 67.5*  PLT 235 258   Basic Metabolic Panel: Recent Labs  Lab 06/22/20 0636 06/23/20 0338  NA 139 136  K 3.6 4.5  CL 104 100  CO2 23 27  GLUCOSE 178* 257*  BUN 8 19  CREATININE 1.17* 1.25*  CALCIUM 8.9 9.0   GFR: Estimated Creatinine Clearance: 75.8 mL/min (A) (by C-G formula based on SCr of 1.25 mg/dL (H)). Liver Function Tests: No results for input(s): AST, ALT, ALKPHOS, BILITOT, PROT, ALBUMIN in the last 168 hours. No results for input(s): LIPASE, AMYLASE in the last 168 hours. No results for input(s): AMMONIA in the last 168 hours. Coagulation Profile: No results for input(s): INR, PROTIME in the last 168 hours. Cardiac Enzymes: No results for input(s): CKTOTAL, CKMB, CKMBINDEX, TROPONINI in the last 168 hours. BNP (last 3 results) No results for input(s): PROBNP in the last 8760 hours. HbA1C: No  results for input(s): HGBA1C in the last 72 hours. CBG: Recent Labs  Lab 06/22/20 1826 06/22/20 2115 06/23/20 0045 06/23/20 0553 06/23/20 1147  GLUCAP 495* 437* 335* 267* 365*   Lipid Profile: No results for input(s): CHOL, HDL, LDLCALC, TRIG, CHOLHDL, LDLDIRECT in the last 72 hours. Thyroid Function Tests: No results for input(s): TSH, T4TOTAL, FREET4, T3FREE, THYROIDAB in the last 72 hours. Anemia Panel: No results for input(s): VITAMINB12, FOLATE, FERRITIN, TIBC, IRON, RETICCTPCT in the last 72 hours. Sepsis Labs: No results for input(s): PROCALCITON, LATICACIDVEN in the last 168 hours.  Recent Results (from the past 240 hour(s))  SARS Coronavirus 2 by RT PCR (hospital order, performed in [facility] hospital lab) Nasopharyngeal Nasopharyngeal Swab     Status: None   Collection Time: 06/22/20  6:37 AM   Specimen: Nasopharyngeal Swab  Result Value Ref Range Status   SARS Coronavirus 2 NEGATIVE NEGATIVE Final    Comment: (NOTE) SARS-CoV-2 target nucleic acids  are NOT DETECTED.  The SARS-CoV-2 RNA is generally detectable in upper and lower respiratory specimens during the acute phase of infection. The lowest concentration of SARS-CoV-2 viral copies this assay can detect is 250 copies / mL. A negative result does not preclude SARS-CoV-2 infection and should not be used as the sole basis for treatment or other patient management decisions.  A negative result may occur with improper specimen collection / handling, submission of specimen other than nasopharyngeal swab, presence of viral mutation(s) within the areas targeted by this assay, and inadequate number of viral copies (<250 copies / mL). A negative result must be combined with clinical observations, patient history, and epidemiological information.  Fact Sheet for Patients:   BoilerBrush.com.cyhttps://www.fda.gov/media/136312/download  Fact Sheet for Healthcare Providers: https://pope.com/https://www.fda.gov/media/136313/download  This test is not  yet approved or  cleared by the [place] FDA and has been authorized for detection and/or diagnosis of SARS-CoV-2 by FDA under an Emergency Use Authorization (EUA).  This EUA will remain in effect (meaning this test can be used) for the duration of the COVID-19 declaration under Section 564(b)(1) of the Act, 21 U.S.C. section 360bbb-3(b)(1), unless the authorization is terminated or revoked sooner.  Performed at [facility], [address] [place], [place] [address]          Radiology Studies: DG Chest Port 1 View  Result Date: 06/22/2020 CLINICAL DATA:  Shortness of breath. EXAM: PORTABLE CHEST 1 VIEW COMPARISON:  CT 03/08/2020.  Chest x-ray 03/08/2020. FINDINGS: Cardiomegaly with diffuse bilateral pulmonary interstitial prominence suggesting CHF. Pneumonitis cannot be excluded. No prominent pleural effusion. No pneumothorax. IMPRESSION: Cardiomegaly with diffuse bilateral pulmonary interstitial prominence suggesting CHF. Pneumonitis cannot be excluded. Electronically Signed   By: Maisie Fushomas  Register   On: 06/22/2020 06:47   VAS US LOWER EXTREMITY VENOUS (DVT) (ONLY [facility] & [facility])  Result Date: 06/22/2020  Lower Venous DVTStudy Indications: Edema.  Comparison Study: No prior studies. Performing Technologist: Jean Rosenthalachel Hodge  Examination Guidelines: A complete evaluation includes B-mode imaging, spectral Doppler, color Doppler, and power Doppler as needed of all accessible portions of each vessel. Bilateral testing is considered an integral part of a complete examination. Limited examinations for reoccurring indications may be performed as noted. The reflux portion of the exam is performed with the patient in reverse Trendelenburg.  +---------+---------------+---------+-----------+----------+-------------------+ RIGHT    CompressibilityPhasicitySpontaneityPropertiesThrombus Aging      +---------+---------------+---------+-----------+----------+-------------------+ CFV      Full            Yes      Yes                                      +---------+---------------+---------+-----------+----------+-------------------+ SFJ      Full                                                             +---------+---------------+---------+-----------+----------+-------------------+ FV Prox  Full                                                             +---------+---------------+---------+-----------+----------+-------------------+  FV Mid   Full                                                             +---------+---------------+---------+-----------+----------+-------------------+ FV DistalFull                                                             +---------+---------------+---------+-----------+----------+-------------------+ PFV      Full                                                             +---------+---------------+---------+-----------+----------+-------------------+ POP      Full           Yes      Yes                                      +---------+---------------+---------+-----------+----------+-------------------+ PTV      Full                                                             +---------+---------------+---------+-----------+----------+-------------------+ PERO     Full                                         Some segments not                                                         visualized.         +---------+---------------+---------+-----------+----------+-------------------+   +----+---------------+---------+-----------+----------+--------------+ LEFTCompressibilityPhasicitySpontaneityPropertiesThrombus Aging +----+---------------+---------+-----------+----------+--------------+ CFV Full           Yes      Yes                                 +----+---------------+---------+-----------+----------+--------------+     Summary: RIGHT: - There is no evidence of deep vein thrombosis in the  lower extremity.  - No cystic structure found in the popliteal fossa.   *See table(s) above for measurements and observations. Electronically signed by Waverly Ferrari MD on 06/22/2020 at 12:54:44 PM.    Final    Scheduled Meds: . DULoxetine  40 mg Oral Daily  . enoxaparin (LOVENOX) injection  60 mg Subcutaneous Q24H  . fluticasone  2 spray Each Nare Daily  . furosemide  40 mg Intravenous Q12H  . insulin aspart  0-20 Units  Subcutaneous TID WC  . insulin aspart  0-5 Units Subcutaneous QHS  . insulin aspart protamine- aspart  100 Units Subcutaneous BID WC  . loratadine  10 mg Oral Daily  . losartan  50 mg Oral Daily  . metoprolol succinate  100 mg Oral Daily  . mometasone-formoterol  2 puff Inhalation BID  . nicotine  14 mg Transdermal Daily  . pantoprazole  40 mg Oral Daily  . potassium chloride SA  20 mEq Oral Daily  . rosuvastatin  5 mg Oral Daily  . sodium chloride flush  3 mL Intravenous Q12H  . tiZANidine  6 mg Oral TID  . umeclidinium bromide  2 puff Inhalation Daily   Continuous Infusions: . sodium chloride       LOS: 0 days    Time spent: 25 mins.    Cipriano Bunker, MD [facility]   If 7PM-7AM, please contact night-coverage

## 2020-06-23 NOTE — Progress Notes (Signed)
CBG 42, NT gave juice and RN gave glucose gel D/T fluid restriction and one soda. Patient said she felt a little weak and shaky. RN noticed that patients attention span was short and she had delayed responses to questions. RN helped patient get glucose gel down and upon CBG recheck, it was 77. Patient was then talking more and said she felt better. Will continue to monitor throughout shift.

## 2020-06-23 NOTE — Progress Notes (Signed)
Inpatient Diabetes Program Recommendations  AACE/ADA: New Consensus Statement on Inpatient Glycemic Control (2015)  Target Ranges:  Prepandial:   less than 140 mg/dL      Peak postprandial:   less than 180 mg/dL (1-2 hours)      Critically ill patients:  140 - 180 mg/dL   Lab Results  Component Value Date   GLUCAP 365 (H) 06/23/2020   HGBA1C 9.0 (A) 06/02/2020   HGBA1C 9.0 06/02/2020   HGBA1C 9.0 (A) 06/02/2020   HGBA1C 9.0 (A) 06/02/2020    Review of Glycemic Control Results for Jill Shaw, Jill Shaw (MRN [id number]) as of 06/23/2020 13:24  Ref. Range 06/22/2020 18:26 06/22/2020 21:15 06/23/2020 00:45 06/23/2020 05:53 06/23/2020 11:47  Glucose-Capillary Latest Ref Range: 70 - 99 mg/dL 324 (H) 401 (H) 027 (H) 267 (H) 365 (H)   Diabetes history: DM 2 Outpatient Diabetes medications:  Humalog 75/25- 100 units bid Current orders for Inpatient glycemic control:  Novolog resistant tid with meals and HS Novolog 70/30 mix 100 units bid  Inpatient Diabetes Program Recommendations:    Consider increasing Novolog 70/30 mix to 110 units bid.   Thanks  Beryl Meager, RN, BC-ADM Inpatient Diabetes Coordinator Pager [phone] (8a-5p)

## 2020-06-23 NOTE — Plan of Care (Signed)
  Problem: Education: Goal: Knowledge of General Education information will improve Description: Including pain rating scale, medication(s)/side effects and non-pharmacologic comfort measures Outcome: Progressing   Problem: Health Behavior/Discharge Planning: Goal: Ability to manage health-related needs will improve Outcome: Progressing   Problem: Pain Managment: Goal: General experience of comfort will improve Outcome: Progressing   

## 2020-06-23 NOTE — Consult Note (Addendum)
Cardiology Consultation:   Patient ID: Jill Shaw MRN: [id number]; DOB: 19-Jan-1976  Admit date: 06/22/2020 Date of Consult: 06/23/2020  Primary Care Provider: Vevelyn Francois, NP [facility] Cardiologist: Quay Burow, MD  [facility] Electrophysiologist:  None    Patient Profile:   Jill Shaw is a 44 y.o. female with a history of chronic diastolic CHF, labile hypertension with multiple ED visit for hypotension, type 2 diabetes mellitus, COPD with ongoing tobacco abuse, GERD, seizures, sickle cell trait, dysfunction uterine bleeding s/p ablation and morbid obesity with BMI of 47.15 who is being seen today for the evaluation of CHF at the request of Dr. Lorin Mercy.  History of Present Illness:   Jill Shaw is a 44 year old female with the above history who is followed by Dr. Gwenlyn Found. She was referred to Dr. Gwenlyn Found in 02/2020 for evaluation of shortness of breath on exertion and tachycardia. She has multiple episodes of hypotension leading to either ED visit or admission - these have been felt to be due to polypharmacy. Echo was done in 04/2020 and showed LVEF of 55-60% with normal wall motion, moderate LVH, and grade 1 diastolic dysfunction.   Patient presented to the ED via yesterday for further evaluation of shortness of breath. Patient has chronic shortness of breath with relatively minimal activity. She has to stop while washing the stress and pause while bathing to catch her breath. She is not very active. However, over the last couple of days breathing as been much worse than usual. She states it has been "40 times" as bad. No significant improvement with inhalers. She used her mom's home O2 which seemed to help some. She reports 6 pillow orthopnea but sates she has had this from years. She describes PND if she sleep on a flatter surface. She also likely has obstructive sleep apnea/obesity hypoventilation syndrome but has never been tested. She reports snoring and daytime somnolence.  She has chronic lower extremity edema that she states has been worse over the last 2-6 months. Elevating legs do not seem to help much. She also reports some abdominal bloating and periodic nausea. She also reports recent weight gain. She estimates her baseline weight is around 260 lbs and she was 276 lbs on admission. Patient notes intermittent chest tightness/throbbing. This occurs when she is more active and gets short of breath. Pain is normally central but can also be around left and right breast. She also notes some left arm pain at times. She had left arm pain a couple of days ago when she was significantly short of breath. However, she also reports left arm pain that does not sound like an anginal equivalent - she states sometimes her whole arm will hurt and she can barely lift it up. She notes palpitations with relatively minimally activity. She also notes occasional lightheadedness/dizziness and reports multiple episodes of syncope in the past. Last episode of syncope occurred around June/July of this year and she states it occurred when she was in a lot of pain from uterine ablation (sounds vasovagal). This is not the first time she has passed out due to pain. She notes body aches and chills which are not unusual for her but denies any fevers. She reports abnormal vaginal bleeding (history of dysfunction uterine bleeding) but no abnormal bleeding in urine or stools.  Due to worsening shortness of breath, EMS was called. EMS administered Solumedrol and Duo-Neb. In the ED, patient hypertensive and mildly tachypneic. O2 sats in the mid 90's. EKG showed  sinus tachycardia, rate 100 bpm, with non-specific ST/T changes. High-sensitivity troponin 103 >> 86. BNP mildly elevated at 266. Chest x-ray showed cardiomegaly with diffuse bilateral pulmonary interstitial prominence suggesting CHF. Pneumonitis cannot be excluded though. WBC 12.4, Hgb 8.3, Plts 235. Na 139, K 3.6, Glucose 178, BUN 8, Cr 1.17. COVID-19  negative. Patient admitted for acute CHF. Cardiology consulted for further evaluation.  At the time of this evaluation, patient resting comfortably. She feels a little better with the IV Lasix.  Patient has a 21-22 year smoking history and has smoked about 1/2 per day for the last 7-8 years. She also reports family history of CAD with her mother and maternal aunts having heart attacks in their 31's to 47's.  Past Medical History:  Diagnosis Date  . Anemia   . Arthritis    knees, hands  . Asthma   . Chronic diastolic (congestive) heart failure ([place])   . COPD (chronic obstructive pulmonary disease) ([place])   . Diabetes mellitus without complication ([place])    type 2  . Dysfunctional uterine bleeding   . GERD (gastroesophageal reflux disease)   . Hypertension   . Neuromuscular disorder (H[facility])    neuropathy feet  . Seizures ([place]) 09/12/2017   pt states r/t stress and blood sugar - no meds last one 4 months ago, not seen neurologist  . Sickle cell trait ([place])   . Smoker   . Vitamin D deficiency 10/2019  . Wears glasses     Past Surgical History:  Procedure Laterality Date  . CESAREAN SECTION     x 1. for twins  . DILATION AND CURETTAGE OF UTERUS N/A 08/20/2019   Procedure: DILATATION AND CURETTAGE;  Surgeon: Emily Filbert, MD;  Location: [facility];  Service: Gynecology;  Laterality: N/A;  . ENDOMETRIAL ABLATION N/A 08/20/2019   Procedure: [place] Ablation;  Surgeon: Emily Filbert, MD;  Location: [facility];  Service: Gynecology;  Laterality: N/A;  . EYE SURGERY Bilateral    laser right and cataract removed left eye  . RADIOLOGY WITH ANESTHESIA N/A 09/16/2019   Procedure: MRI WITH ANESTHESIA   L SPINE WITHOUT CONTRAST, T SPINE WITHOUT CONTRAST , CERVICAL WITHOUT CONTRAST;  Surgeon: Radiologist, Medication, MD;  Location: [facility];  Service: Radiology;  Laterality: N/A;  . TUBAL LIGATION     interval BTL  . UPPER GI ENDOSCOPY  07/2017     Home Medications:  Prior to Admission medications    Medication Sig Start Date End Date Taking? Authorizing Provider  albuterol (PROVENTIL) (2.5 MG/3ML) 0.083% nebulizer solution Take 3 mLs (2.5 mg total) by nebulization every 6 (six) hours as needed for wheezing or shortness of breath. 12/02/19  Yes Vevelyn Francois, NP  albuterol (VENTOLIN HFA) 108 (90 Base) MCG/ACT inhaler Inhale 2 puffs into the lungs every 6 (six) hours as needed for wheezing or shortness of breath. 12/02/19  Yes King, Diona Foley, NP  budesonide-formoterol (SYMBICORT) 160-4.5 MCG/ACT inhaler INHALE 2 PUFFS INTO THE LUNGS 2 (TWO) TIMES DAILY. Patient taking differently: Inhale 2 puffs into the lungs 2 (two) times daily.  12/02/19  Yes Vevelyn Francois, NP  cetirizine (ZYRTEC) 10 MG tablet Take 1 tablet (10 mg total) by mouth daily. 12/02/19  Yes Vevelyn Francois, NP  Cholecalciferol (VITAMIN D-3) 125 MCG (5000 UT) TABS Take 1 tablet by mouth daily. Patient taking differently: Take 5,000 Units by mouth daily.  08/22/19  Yes Hilts, Legrand Como, MD  diclofenac (VOLTAREN) 75 MG EC tablet Take 1 tablet (75 mg  total) by mouth 2 (two) times daily as needed. Patient taking differently: Take 75 mg by mouth 2 (two) times daily as needed for mild pain.  06/07/20  Yes Hilts, Legrand Como, MD  DULoxetine 40 MG CPEP Take 40 mg by mouth daily. 03/12/20 06/22/20 Yes Vevelyn Francois, NP  ferrous sulfate 325 (65 FE) MG tablet Take 1 tablet (325 mg total) by mouth 3 (three) times daily with meals. Patient taking differently: Take 325 mg by mouth 2 (two) times daily with a meal.  12/02/19  Yes Vevelyn Francois, NP  fluticasone (FLONASE) 50 MCG/ACT nasal spray Place 2 sprays into both nostrils daily. 11/24/19  Yes Vevelyn Francois, NP  furosemide (LASIX) 40 MG tablet Take 1 tablet (40 mg total) by mouth daily. 02/04/20  Yes Thurnell Lose, MD  Glucosamine Sulfate 1000 MG CAPS Take 1 capsule (1,000 mg total) by mouth 2 (two) times daily. 08/22/19  Yes Hilts, Legrand Como, MD  hydrocortisone 2.5 % cream Apply topically 2 (two) times  daily. 03/12/20  Yes Vevelyn Francois, NP  hydroquinone 4 % cream Apply topically 2 (two) times daily. 05/07/20  Yes King, Diona Foley, NP  Insulin Lispro Prot & Lispro (HUMALOG MIX 75/25 KWIKPEN) (75-25) 100 UNIT/ML Kwikpen INJECT 100 UNITS EVERY 12 HOURS Patient taking differently: Inject 100 Units into the skin every 12 (twelve) hours.  12/02/19  Yes Vevelyn Francois, NP  losartan (COZAAR) 50 MG tablet Take 1 tablet (50 mg total) by mouth daily. 03/12/20 06/22/20 Yes Vevelyn Francois, NP  metoprolol succinate (TOPROL-XL) 100 MG 24 hr tablet Take 1 tablet (100 mg total) by mouth daily. Take with or immediately following a meal. 12/26/19  Yes Vevelyn Francois, NP  norethindrone (AYGESTIN) 5 MG tablet Take 2 tablets (10 mg total) by mouth in the morning, at noon, in the evening, and at bedtime. 05/26/20  Yes Aletha Halim, MD  omeprazole (PRILOSEC) 40 MG capsule Take 1 capsule (40 mg total) by mouth daily. 12/02/19  Yes Vevelyn Francois, NP  oxyCODONE (ROXICODONE) 5 MG immediate release tablet Take 1-2 tablets (5-10 mg total) by mouth every 6 (six) hours as needed for severe pain. 05/07/20  Yes Sloan Leiter, MD  potassium chloride SA (KLOR-CON) 20 MEQ tablet Take 1 tablet (20 mEq total) by mouth daily. 02/04/20  Yes Thurnell Lose, MD  promethazine (PHENERGAN) 12.5 MG tablet Take 1 tablet (12.5 mg total) by mouth every 6 (six) hours as needed for nausea or vomiting. 03/10/20  Yes Patrecia Pour, MD  promethazine (PHENERGAN) 25 MG tablet Take 1 tablet (25 mg total) by mouth every 6 (six) hours as needed for nausea or vomiting. 06/11/20  Yes Guss Bunde, MD  rosuvastatin (CRESTOR) 5 MG tablet Take 1 tablet (5 mg total) by mouth daily. 06/03/20  Yes Vevelyn Francois, NP  saxagliptin HCl (ONGLYZA) 2.5 MG TABS tablet Take 1 tablet (2.5 mg total) by mouth daily. 12/02/19  Yes Vevelyn Francois, NP  Tiotropium Bromide Monohydrate (SPIRIVA RESPIMAT) 2.5 MCG/ACT AERS Inhale 2 puffs into the lungs daily. 04/09/20  Yes Martyn Ehrich, NP  tizanidine (ZANAFLEX) 6 MG capsule TAKE 1 CAPSULE (6 MG TOTAL) BY MOUTH 3 (THREE) TIMES DAILY. 05/19/20  Yes Vevelyn Francois, NP  tranexamic acid (LYSTEDA) 650 MG TABS tablet Take 2 tablets (1,300 mg total) by mouth 3 (three) times daily. Take during menses for a maximum of five days 04/23/20  Yes Aletha Halim, MD  Turmeric 500 MG  CAPS Take 500 mg by mouth 2 (two) times daily. 08/22/19  Yes Hilts, Casimiro Needle, MD  Vitamin D, Ergocalciferol, (DRISDOL) 1.25 MG (50000 UNIT) CAPS capsule Take 1 capsule (50,000 Units total) by mouth every 7 (seven) days. Patient taking differently: Take 50,000 Units by mouth every Friday.  12/02/19  Yes Barbette Merino, NP  blood glucose meter kit and supplies KIT Dispense based on patient and insurance preference. Use up to four times daily as directed. (FOR ICD-9 250.00, 250.01). Patient taking differently: 1 each by Other route See admin instructions. Dispense based on patient and insurance preference. Use up to four times daily as directed. (FOR ICD-9 250.00, 250.01). 02/12/20   Barbette Merino, NP  Blood Glucose Monitoring Suppl (TRUE METRIX METER) w/Device KIT 1 each by Does not apply route 4 (four) times daily -  before meals and at bedtime. 01/21/18   Massie Maroon, FNP  Blood Pressure Monitoring (BLOOD PRESSURE KIT) DEVI Check BP BID prn Patient taking differently: 1 each by Other route 2 (two) times daily as needed (blood pressure).  02/12/20   Barbette Merino, NP  glucose blood (TRUE METRIX BLOOD GLUCOSE TEST) test strip Use as instructed 04/15/20   Barbette Merino, NP  Insulin Pen Needle (PEN NEEDLES) 30G X 5 MM MISC 1 Units by Does not apply route as directed. 12/02/19   Barbette Merino, NP  TRUEplus Lancets 28G MISC 1 each by Other route in the morning, at noon, in the evening, and at bedtime. 02/12/20   [provider]  hydrALAZINE (APRESOLINE) 50 MG tablet Take 1 tablet (50 mg total) by mouth 3 (three) times daily. 02/03/20 02/20/20  Leroy Sea, MD    Inpatient Medications: Scheduled Meds: . DULoxetine  40 mg Oral Daily  . enoxaparin (LOVENOX) injection  60 mg Subcutaneous Q24H  . fluticasone  2 spray Each Nare Daily  . furosemide  40 mg Intravenous Q12H  . insulin aspart  0-20 Units Subcutaneous TID WC  . insulin aspart  0-5 Units Subcutaneous QHS  . insulin aspart protamine- aspart  100 Units Subcutaneous BID WC  . loratadine  10 mg Oral Daily  . losartan  50 mg Oral Daily  . metoprolol succinate  100 mg Oral Daily  . mometasone-formoterol  2 puff Inhalation BID  . nicotine  14 mg Transdermal Daily  . pantoprazole  40 mg Oral Daily  . potassium chloride SA  20 mEq Oral Daily  . rosuvastatin  5 mg Oral Daily  . sodium chloride flush  3 mL Intravenous Q12H  . tiZANidine  6 mg Oral TID  . umeclidinium bromide  2 puff Inhalation Daily   Continuous Infusions: . sodium chloride     PRN Meds: sodium chloride, albuterol, oxyCODONE, sodium chloride flush  Allergies:    Allergies  Allergen Reactions  . Ketoprofen Nausea And Vomiting  . Aspirin Nausea Only  . Gabapentin Nausea And Vomiting    upset stomach  . Ibuprofen Nausea And Vomiting  . Liraglutide Nausea And Vomiting  . Naproxen Nausea And Vomiting  . Omeprazole-Sodium Bicarbonate Nausea And Vomiting  . Sulfa Antibiotics Nausea And Vomiting  . Tramadol Nausea And Vomiting    stomach upset    Social History:   Social History   Socioeconomic History  . Marital status: Legally Separated    Spouse name: Not on file  . Number of children: Not on file  . Years of education: Not on file  . Highest education level:  Not on file  Occupational History  . Occupation: unemployed  Tobacco Use  . Smoking status: Current Every Day Smoker    Packs/day: 0.50    Years: 26.00    Pack years: 13.00    Types: Cigarettes  . Smokeless tobacco: Never Used  . Tobacco comment: 0.5 pack/day, desires patch  Vaping Use  . Vaping Use: Never used  Substance and  Sexual Activity  . Alcohol use: No  . Drug use: No  . Sexual activity: Not Currently    Birth control/protection: None  Other Topics Concern  . Not on file  Social History Narrative  . Not on file   Social Determinants of Health   Financial Resource Strain:   . Difficulty of Paying Living Expenses: Not on file  Food Insecurity: No Food Insecurity  . Worried About Charity fundraiser in the Last Year: Never true  . Ran Out of Food in the Last Year: Never true  Transportation Needs: No Transportation Needs  . Lack of Transportation (Medical): No  . Lack of Transportation (Non-Medical): No  Physical Activity:   . Days of Exercise per Week: Not on file  . Minutes of Exercise per Session: Not on file  Stress:   . Feeling of Stress : Not on file  Social Connections:   . Frequency of Communication with Friends and Family: Not on file  . Frequency of Social Gatherings with Friends and Family: Not on file  . Attends Religious Services: Not on file  . Active Member of Clubs or Organizations: Not on file  . Attends Archivist Meetings: Not on file  . Marital Status: Not on file  Intimate Partner Violence:   . Fear of Current or Ex-Partner: Not on file  . Emotionally Abused: Not on file  . Physically Abused: Not on file  . Sexually Abused: Not on file    Family History:    Family History  Problem Relation Age of Onset  . Diabetes Mother   . Hypertension Mother      ROS:  Please see the history of present illness.  All other ROS reviewed and negative.     Physical Exam/Data:   Vitals:   06/23/20 0435 06/23/20 0750 06/23/20 0752 06/23/20 1149  BP: (!) 143/86  118/68 (!) 143/81  Pulse: 73  76 67  Resp: _0 Temp: 98.6 F (37 C)  99 F (37.2 C)   TempSrc: Oral  Oral   SpO2: 98% 96% 94% 99%  Weight:      Height:        Intake/Output Summary (Last 24 hours) at 06/23/2020 1609 Last data filed at 06/23/2020 0900 Gross per 24 hour  Intake 840 ml  Output --   Net 840 ml   Last 3 Weights 06/23/2020 06/22/2020 06/22/2020  Weight (lbs) 274 lb 11.2 oz 276 lb 9.6 oz 270 lb  Weight (kg) 124.603 kg 125.465 kg 122.471 kg     Body mass index is 47.15 kg/m.  General: 44 y.o. morbidly obese African-American female resting comfortably in no acute distress. HEENT: Normocephalic and atraumatic. Sclera clear. EOMs  Neck: Supple. No carotid bruits. JVD difficult to assess due to body habitus. Heart: RRR. Distinct S1 and S2. No murmurs, gallops, or rubs. Radial and distal pedal pulses 2+ and equal. Lungs: Tachypneic with mild labored breathing. Possible mild crackles in bilateral bases (right more so than left). Abdomen: Soft, obese, and non-tender to palpation. Bowel sounds present. MSK: Normal strength  and tone for age. Extremities: 2+ lower extremity edema.    Skin: Warm and dry. Neuro: Alert and oriented x3. No focal deficits. Psych: Normal affect. Responds appropriately.   EKG:  The EKG was personally reviewed and demonstrates: Sinus tachycardia, rate 100 bpm, with non-specific ST/T changes. Telemetry:  Telemetry was personally reviewed and demonstrates:  Normal sinus rhythm with rates in the 70's to 80's.  Relevant CV Studies:  Echocardiogram 06/23/2020: Impressions: 1. Left ventricular ejection fraction, by estimation, is 55 to 60%. The  left ventricle has normal function. The left ventricle has no regional  wall motion abnormalities. There is moderate concentric left ventricular  hypertrophy. Left ventricular  diastolic parameters are consistent with Grade I diastolic dysfunction  (impaired relaxation). Elevated left ventricular end-diastolic pressure.  2. Right ventricular systolic function is normal. The right ventricular  size is normal.  3. The mitral valve is normal in structure. Trivial mitral valve  regurgitation. No evidence of mitral stenosis.  4. The aortic valve is normal in structure. Aortic valve regurgitation is  not visualized.  No aortic stenosis is present.  5. The inferior vena cava is dilated in size with <50% respiratory  variability, suggesting right atrial pressure of 15 mmHg.  Laboratory Data:  High Sensitivity Troponin:   Recent Labs  Lab 06/22/20 0636 06/22/20 0829  TROPONINIHS 103* 86*     Chemistry Recent Labs  Lab 06/22/20 0636 06/23/20 0338  NA 139 136  K 3.6 4.5  CL 104 100  CO2 23 27  GLUCOSE 178* 257*  BUN 8 19  CREATININE 1.17* 1.25*  CALCIUM 8.9 9.0  GFRNONAA 57* 53*  GFRAA >60 >60  ANIONGAP 12 9    No results for input(s): PROT, ALBUMIN, AST, ALT, ALKPHOS, BILITOT in the last 168 hours. Hematology Recent Labs  Lab 06/22/20 0636 06/23/20 0702  WBC 12.4* 13.9*  RBC 4.22 4.19  HGB 8.3* 8.2*  HCT 29.3* 28.3*  MCV 69.4* 67.5*  MCH 19.7* 19.6*  MCHC 28.3* 29.0*  RDW 23.9* 23.4*  PLT 235 258   BNP Recent Labs  Lab 06/22/20 0636  BNP 266.3*    DDimer No results for input(s): DDIMER in the last 168 hours.   Radiology/Studies:  DG Chest Port 1 View  Result Date: 06/22/2020 CLINICAL DATA:  Shortness of breath. EXAM: PORTABLE CHEST 1 VIEW COMPARISON:  CT 03/08/2020.  Chest x-ray 03/08/2020. FINDINGS: Cardiomegaly with diffuse bilateral pulmonary interstitial prominence suggesting CHF. Pneumonitis cannot be excluded. No prominent pleural effusion. No pneumothorax. IMPRESSION: Cardiomegaly with diffuse bilateral pulmonary interstitial prominence suggesting CHF. Pneumonitis cannot be excluded. Electronically Signed   By: Marcello Moores  Register   On: 06/22/2020 06:47   VAS [place] LOWER EXTREMITY VENOUS (DVT) (ONLY [facility] & [facility])  Result Date: 06/22/2020  Lower Venous DVTStudy Indications: Edema.  Comparison Study: No prior studies. Performing Technologist: Darlin Coco  Examination Guidelines: A complete evaluation includes B-mode imaging, spectral Doppler, color Doppler, and power Doppler as needed of all accessible portions of each vessel. Bilateral testing is considered an integral part of  a complete examination. Limited examinations for reoccurring indications may be performed as noted. The reflux portion of the exam is performed with the patient in reverse Trendelenburg.  +---------+---------------+---------+-----------+----------+-------------------+ RIGHT    CompressibilityPhasicitySpontaneityPropertiesThrombus Aging      +---------+---------------+---------+-----------+----------+-------------------+ CFV      Full           Yes      Yes                                      +---------+---------------+---------+-----------+----------+-------------------+  SFJ      Full                                                             +---------+---------------+---------+-----------+----------+-------------------+ FV Prox  Full                                                             +---------+---------------+---------+-----------+----------+-------------------+ FV Mid   Full                                                             +---------+---------------+---------+-----------+----------+-------------------+ FV DistalFull                                                             +---------+---------------+---------+-----------+----------+-------------------+ PFV      Full                                                             +---------+---------------+---------+-----------+----------+-------------------+ POP      Full           Yes      Yes                                      +---------+---------------+---------+-----------+----------+-------------------+ PTV      Full                                                             +---------+---------------+---------+-----------+----------+-------------------+ PERO     Full                                         Some segments not                                                         visualized.          +---------+---------------+---------+-----------+----------+-------------------+   +----+---------------+---------+-----------+----------+--------------+ LEFTCompressibilityPhasicitySpontaneityPropertiesThrombus Aging +----+---------------+---------+-----------+----------+--------------+ CFV Full           Yes  Yes                                 +----+---------------+---------+-----------+----------+--------------+     Summary: RIGHT: - There is no evidence of deep vein thrombosis in the lower extremity.  - No cystic structure found in the popliteal fossa.   *See table(s) above for measurements and observations. Electronically signed by Deitra Mayo MD on 06/22/2020 at 12:54:44 PM.    Final     Assessment and Plan:   Acute on Chronic Diastolic CHF - Patient presented with progressive shortness of breath. I think dyspnea is likely multifactorial due to acute on chronic CHF, underlying COPD/asthma, continue tobacco use, suspected obstructive sleep apnea and obesity hypoventilation syndrome, and morbid obesity. - BNP mildly elevated at 266 but may be falsely low due to morbid obesity.  - Chest x-ray showed cardiomegaly with diffuse bilateral pulmonary interstitial prominence suggesting CHF. - Echo in 02/2020 showed LVEF of 55-60% with grade 1 diastolic dysfunction.  - Started on IV Lasix 60m twice daily with good urinary response. Net negative 1.16 L so far. Weight down 2 lbs since admission. Renal function stable. - Difficult to accurately assess volume status due to body habitus but suspect she has a great deal of fluid. - Will increase IV Lasix to 836mtwice daily.  - Continue Losartan 5068maily and Toprol-XL 100m37mily. - Continue to monitor daily weights, strict I/O's, and renal function.  Demand Ischemia - High-sensitivity troponin minimally elevated at 103 >> 86. Not consistent with ACS. - Chest x-ray showed no acute changes.  - Patient does not some chest  tightness/throbbing as well as some left arm pain.  - Due not think acute worsening shortness of breath or troponin elevation is ACS. However, she does have multiple CV risk factors including HTN, HLD, poorly controlled, DM, tobacco abuse, and family history. May benefit from ischemic evaluation but given morbid obesity Myoview and coronary CTA may not be a good option. Will discuss with MD.  Syncope - Patient reports several episodes of syncope in the past. Most recently in June/July of this years. Occurred when she was in a lot of pain. Sounds vasovagal. Also sounds like she may of had syncope in the past after standing to quickly. She does have a history of hypotension/hypovoelmia (felt to be due to polypharmacy) so possibly orthostatic hypotension in the past. - Continue to monitor on telemetry.   Hypertension - BP mildly elevated. - Continue Losartan and Toprol as above. - Hopefully BP will improve as patient is diuresed. She has a history of hypotension (felt to be due to polypharmacy) so will need to be cautious with medication adjustments.  Hyperlipidemia - Recent lipid panel from 06/02/2020: Total Cholesterol 135, Triglycerides 166, HDL 29, LDL 77.  - Continue home Crestor 5mg 41mly.  Type 2 Diabetes Mellitus  - Recent Hemoglobin A1c 9.0%. - Management per primary team.  Morbid Obesity - BMI 47.5. I think this is likely the crux of her problem.  - Need to continue to discuss the importance of weight loss. She would benefit from referral to [facility] - She has a 21-22 year smoking history and has smoked 1/2 pack per day for the last 7-8 years.  - She knows she needs to quick but states she smokes when she gets stressed. - Continue to emphasize importance of complete cessation.  Otherwise, per primary team: -  COPD: continue inhalers - Chronic pain - Seizure disorder   For questions or updates, please contact [place] Please  consult [email or web address] for contact info under    Signed, Darreld Mclean, PA-C  06/23/2020 4:09 PM   Agree with note by Sande Rives, PA-C  Ms. Hossain is a 44 year old morbidly overweight African-American female admitted 2 days ago with CHF/volume overload.  I saw her in the office for evaluation in May of this year.  She has a history of ongoing tobacco abuse, hypertension, diabetes and sickle cell trait.  She does admit to dietary indiscretion.  She takes her medications erratically.  She is been slowly gaining weight as an outpatient and saw her PCP.  She is diuresed approxi-1 L on 40 mg of Lasix IV twice daily.  I suspect she will need to have double that dose.  She still has 2+ pitting edema.  The major issue will be medication compliance and dietary modification.  We talked about smoking cessation and taking her medications as directed.  I suggest that we increase her Lasix to 80 mg IV twice daily.  Her serum creatinine is 1.2.  I will follow her urine output and her renal function.  I suspect she will be ready for discharge in the next 48 hours which time I would not increase her outpatient diuretic dose.   Lorretta Harp, M.D., [place], [facility], [place] [address] [place], [place]  [address]  [phone] 06/23/2020 4:43 PM

## 2020-06-23 NOTE — Evaluation (Signed)
Physical Therapy Evaluation Patient Details Name: Jill Shaw MRN: [id number] DOB: 1976-05-02 Today's Date: 06/23/2020   History of Present Illness   KATELYNE GALSTER is a 44 y.o. female with medical history significant of morbid obesity; tobacco dependence; seizures; HTN; DM; and COPD presenting with SOB.  She reports that she felt her chest was tightening up and she couldn't breathe.  Symptoms started Sunday and worsened that night.  She felt a little better Monday Am but it worsened again that evening.    Clinical Impression  Pt admitted with above diagnosis. Pt was able to ambulate in room with good stability with RW. Should progress well and continue Outpt at home. Does want equipment as below and this PT agrees.   Pt currently with functional limitations due to the deficits listed below (see PT Problem List). Pt will benefit from skilled PT to increase their independence and safety with mobility to allow discharge to the venue listed below.      Follow Up Recommendations Outpatient PT    Equipment Recommendations  Bariatric 3in1 (PT);Wheelchair (22x18 with anti tippers, foot rests and desk armrests);Wheelchair cushion (22x18 pressure relieving cushion) (tub bench- bariatric recommended)    Recommendations for Other Services       Precautions / Restrictions Precautions Precautions: Fall Restrictions Weight Bearing Restrictions: No      Mobility  Bed Mobility Overal bed mobility: Independent                Transfers Overall transfer level: Independent                  Ambulation/Gait Ambulation/Gait assistance: Supervision Gait Distance (Feet): 80 Feet Assistive device: Rolling walker (2 wheeled) Gait Pattern/deviations: Step-through pattern;Decreased stride length;Trunk flexed;Wide base of support   Gait velocity interpretation: <1.31 ft/sec, indicative of household ambulator General Gait Details: Pt walked laps in the room. Did not want to go in hallway.   Did get a little winded and sats at end of walk 88% but did recover to 90% within a minute of rest.   Stairs            Wheelchair Mobility    Modified Rankin (Stroke Patients Only)       Balance Overall balance assessment: Needs assistance Sitting-balance support: No upper extremity supported;Feet supported Sitting balance-Leahy Scale: Fair     Standing balance support: No upper extremity supported;Bilateral upper extremity supported;During functional activity Standing balance-Leahy Scale: Fair Standing balance comment: can stand statically wihtout RW but pt prefers RW for stability.                              Pertinent Vitals/Pain Pain Assessment: No/denies pain    Home Living Family/patient expects to be discharged to:: Private residence Living Arrangements: Children;Parent (mother and son live with patient) Available Help at Discharge: Family;Available 24 hours/day Type of Home: Apartment Home Access: Stairs to enter Entrance Stairs-Rails: Right Entrance Stairs-Number of Steps: flight Home Layout: One level Home Equipment: Walker - 2 wheels;Cane - single point      Prior Function Level of Independence: Independent with assistive device(s)         Comments: Reports using RW vs cane depending on the day.      Hand Dominance        Extremity/Trunk Assessment   Upper Extremity Assessment Upper Extremity Assessment: Defer to OT evaluation    Lower Extremity Assessment Lower Extremity Assessment: Generalized weakness  Cervical / Trunk Assessment Cervical / Trunk Assessment: Normal  Communication   Communication: No difficulties  Cognition Arousal/Alertness: Awake/alert Behavior During Therapy: WFL for tasks assessed/performed Overall Cognitive Status: Within Functional Limits for tasks assessed                                        General Comments      Exercises     Assessment/Plan    PT Assessment  Patient needs continued PT services  PT Problem List Decreased activity tolerance;Decreased balance;Decreased mobility;Decreased knowledge of use of DME;Decreased safety awareness;Decreased knowledge of precautions;Decreased strength;Decreased range of motion;Obesity       PT Treatment Interventions DME instruction;Gait training;Stair training;Functional mobility training;Therapeutic activities;Therapeutic exercise;Balance training;Patient/family education    PT Goals (Current goals can be found in the Care Plan section)  Acute Rehab PT Goals Patient Stated Goal: to go home PT Goal Formulation: With patient Time For Goal Achievement: 07/07/20 Potential to Achieve Goals: Good    Frequency Min 3X/week   Barriers to discharge        Co-evaluation               AM-PAC PT "6 Clicks" Mobility  Outcome Measure Help needed turning from your back to your side while in a flat bed without using bedrails?: None Help needed moving from lying on your back to sitting on the side of a flat bed without using bedrails?: None Help needed moving to and from a bed to a chair (including a wheelchair)?: A Little Help needed standing up from a chair using your arms (e.g., wheelchair or bedside chair)?: A Little Help needed to walk in hospital room?: A Little Help needed climbing 3-5 steps with a railing? : A Little 6 Click Score: 20    End of Session Equipment Utilized During Treatment: Gait belt Activity Tolerance: Patient limited by fatigue Patient left: with call bell/phone within reach;in bed;Other (comment) (sitting eob with MD talking to pt) Nurse Communication: Mobility status PT Visit Diagnosis: Unsteadiness on feet (R26.81);Muscle weakness (generalized) (M62.81)    Time: 1601-0932 PT Time Calculation (min) (ACUTE ONLY): 16 min   Charges:   PT Evaluation $PT Eval Moderate Complexity: 1 Mod          Lorri Fukuhara W,PT Acute Rehabilitation Services Pager:  [phone]  Office:   [phone]    Berline Lopes 06/23/2020, 1:48 PM

## 2020-06-24 LAB — GLUCOSE, CAPILLARY
Glucose-Capillary: 82 mg/dL (ref 70–99)
Glucose-Capillary: 94 mg/dL (ref 70–99)
Glucose-Capillary: 244 mg/dL — ABNORMAL HIGH (ref 70–99)
Glucose-Capillary: 156 mg/dL — ABNORMAL HIGH (ref 70–99)
Glucose-Capillary: 144 mg/dL — ABNORMAL HIGH (ref 70–99)
Glucose-Capillary: 101 mg/dL — ABNORMAL HIGH (ref 70–99)
Glucose-Capillary: 135 mg/dL — ABNORMAL HIGH (ref 70–99)

## 2020-06-24 LAB — CBC
HCT: 31.5 % — ABNORMAL LOW (ref 36.0–46.0)
Hemoglobin: 9.1 g/dL — ABNORMAL LOW (ref 12.0–15.0)
MCH: 19.8 pg — ABNORMAL LOW (ref 26.0–34.0)
MCHC: 28.9 g/dL — ABNORMAL LOW (ref 30.0–36.0)
MCV: 68.5 fL — ABNORMAL LOW (ref 80.0–100.0)
Platelets: 300 10*3/uL (ref 150–400)
RBC: 4.6 MIL/uL (ref 3.87–5.11)
RDW: 23.5 % — ABNORMAL HIGH (ref 11.5–15.5)
WBC: 14.6 10*3/uL — ABNORMAL HIGH (ref 4.0–10.5)
nRBC: 0 % (ref 0.0–0.2)

## 2020-06-24 LAB — BASIC METABOLIC PANEL
Anion gap: 10 (ref 5–15)
BUN: 23 mg/dL — ABNORMAL HIGH (ref 6–20)
CO2: 28 mmol/L (ref 22–32)
Calcium: 8.9 mg/dL (ref 8.9–10.3)
Chloride: 100 mmol/L (ref 98–111)
Creatinine, Ser: 1.26 mg/dL — ABNORMAL HIGH (ref 0.44–1.00)
GFR calc Af Amer: >60 mL/min (ref >60)
GFR calc non Af Amer: 52 mL/min — ABNORMAL LOW (ref >60)
Glucose, Bld: 191 mg/dL — ABNORMAL HIGH (ref 70–99)
Potassium: 4 mmol/L (ref 3.5–5.1)
Sodium: 138 mmol/L (ref 135–145)

## 2020-06-24 LAB — MAGNESIUM: Magnesium: 1.7 mg/dL (ref 1.7–2.4)

## 2020-06-24 LAB — PHOSPHORUS: Phosphorus: 4.5 mg/dL (ref 2.5–4.6)

## 2020-06-24 NOTE — Progress Notes (Signed)
PROGRESS NOTE    Jill Shaw  GBT:[id number] DOB: [birth year] DOA: 06/22/2020 PCP: Barbette Merino, NP   Brief Narrative:  Jill Shaw is a 44 y.o. female with medical history significant of morbid obesity; tobacco dependence; seizures; HTN; DM; and COPD presenting with progressive shortness of breath.  She reports that she felt her chest was tightening up and she couldn't breathe. She reports  +LE edema worse than usual.  +orthopnea.  +PND.  Grade 1 diastolic CHF on echo in 04/2020.SOB likely 2* to CHF.  CXR appears to be consistent with this CHF. She is admitted for CHF exacerbation.  Echocardiogram recently completed.  She continues to have significant shortness of breath,   Venous duplex right LE : No DVT. Echo in 04/2020 showed LVEF of 55-60% with grade 1 diastolic dysfunction. Cardiology consulted.  Patient requires aggressive diuresis for next 24 to 48 hours.  Assessment & Plan:   Principal Problem:   Acute on chronic diastolic (congestive) heart failure (HCC) Active Problems:   Type 2 diabetes mellitus without complication, with long-term current use of insulin (HCC)   Essential hypertension   Morbid obesity (HCC)   Tobacco dependence   Seizure (HCC)   Chronic obstructive pulmonary disease (HCC)   Chronic pain   Acute on chronic diastolic CHF -Patient with known h/o chronic diastolic CHF presenting with worsening SOB  -Recent echo (04/2020) with grade 1 diastolic dysfunction -CXR consistent with mild pulmonary edema -Mildly elevated BNP, similar to prior. -With elevated BNP and abnl CXR, acute decompensated CHF seems probable as diagnosis. -Continue 81 mg ASA -continue ARB, BB -CHF order set utilized -Was given Lasix 40 mg x 1 in ER, increased to lasix 80 mg IV  BID. -Can give [place] O2 if needed. -Serum creatinine trending up. Recheck in am. -Mildly elevated HS troponin is likely related to demand ischemia; doubt ACS based on symptoms -Cardio consulted,  Patient requires  aggressive diuresis for next 24 to 48 hours. -Lasix increased to 80 mg IV twice daily, patient feels much better,  able to breathe better. -Switch to p.o. Lasix 40 twice daily  at discharge tomorrow.   HTN -Continue home BP meds (Cozaar, Toprol XL) .   HLD -Continue Crestor -Lipids were checked on 8/18 (TC 135, HDL 29, LDL 77, TG 166) so will not repeat at this time  DM -Recent A1c was 9.0 on 8/18, indicating poor control -Continue high-dose insulin 75/25. -Hold Onglyza -Cover withresistant-scale SSI  Chronic pain -Interestingly, while she lists Norco as a current medication,   PDMP review indicates that she is not receiving chronic controled substances as an outpatient -Continue Cymbalta, Norco for now.  COPD -Continue Symbicort, Spiriva, Zyrtec, Flonase, and prn Albuterol  Seizure d/o. -She does not appear to be taking anti-seizure medication at this time. -She denies any recent seizures.  Tobacco dependence -Encourage cessation.  -This was discussed with the patient and should be reviewed on an ongoing basis.  -Patchrequested  Morbid obesity -Body mass index is 46.35 kg/m. -Weight loss should be encouraged. -Outpatient PCP/bariatric medicine/bariatric surgery f/u encouraged. -Hold norethindrone - with her weight, this medication increases her risk of VTE    DVT prophylaxis:Lovenox. Code Status: Full Family Communication: No family at bed side.  Disposition Plan:  The patient is from: home             Anticipated d/c is to: home, possible with [facility] services.             Anticipated d/c  date will depend on clinical response to treatment, likely 1-2 days.             Patient is currently: acutely ill  Consultants:    Cardiology  Procedures:  Antimicrobials:  Anti-infectives (From admission, onward)   None      Subjective: Patient was seen and examined at bedside.  Overnight events noted.  She reports feeling much better.   She denies any  chest pain.  She also reported breathing has improved with increasing Lasix twice a day.   Her leg swelling is also improved.  Objective: Vitals:   06/24/20 0357 06/24/20 0804 06/24/20 0842 06/24/20 1205  BP: 117/71 (!) 125/53  139/77  Pulse: 67 73  71  Resp: 18 20  20   Temp: 98.6 F (37 C) 98.6 F (37 C)  98.8 F (37.1 C)  TempSrc: Oral Oral  Oral  SpO2: 100% 99% 100% 100%  Weight: 123.9 kg     Height:        Intake/Output Summary (Last 24 hours) at 06/24/2020 1451 Last data filed at 06/24/2020 0900 Gross per 24 hour  Intake 960 ml  Output 800 ml  Net 160 ml   Filed Weights   06/22/20 1606 06/23/20 0106 06/24/20 0357  Weight: 125.5 kg 124.6 kg 123.9 kg    Examination:  General exam: Appears calm and comfortable.  Respiratory system: Clear to auscultation. Respiratory effort normal. Cardiovascular system: S1 & S2 heard, RRR. No JVD, murmurs, rubs, gallops or clicks. No pedal edema. Gastrointestinal system: Abdomen is nondistended, soft and nontender. No organomegaly or masses felt. Normal bowel sounds heard. Central nervous system: Alert and oriented. No focal neurological deficits. Extremities:  No clubbing, no cyanosis, peripheral pulses noted. Skin: No rashes, lesions or ulcers Psychiatry: Judgement and insight appear normal. Mood & affect appropriate.     Data Reviewed: I have personally reviewed following labs and imaging studies  CBC: Recent Labs  Lab 06/22/20 0636 06/23/20 0702 06/24/20 0828  WBC 12.4* 13.9* 14.6*  NEUTROABS 8.4* 10.8*  --   HGB 8.3* 8.2* 9.1*  HCT 29.3* 28.3* 31.5*  MCV 69.4* 67.5* 68.5*  PLT 235 258 300   Basic Metabolic Panel: Recent Labs  Lab 06/22/20 0636 06/23/20 0338 06/24/20 0828  NA 139 136 138  K 3.6 4.5 4.0  CL 104 100 100  CO2 23 27 28   GLUCOSE 178* 257* 191*  BUN 8 19 23*  CREATININE 1.17* 1.25* 1.26*  CALCIUM 8.9 9.0 8.9  MG  --   --  1.7  PHOS  --   --  4.5   GFR: Estimated Creatinine Clearance: 74.9  mL/min (A) (by C-G formula based on SCr of 1.26 mg/dL (H)). Liver Function Tests: No results for input(s): AST, ALT, ALKPHOS, BILITOT, PROT, ALBUMIN in the last 168 hours. No results for input(s): LIPASE, AMYLASE in the last 168 hours. No results for input(s): AMMONIA in the last 168 hours. Coagulation Profile: No results for input(s): INR, PROTIME in the last 168 hours. Cardiac Enzymes: No results for input(s): CKTOTAL, CKMB, CKMBINDEX, TROPONINI in the last 168 hours. BNP (last 3 results) No results for input(s): PROBNP in the last 8760 hours. HbA1C: No results for input(s): HGBA1C in the last 72 hours. CBG: Recent Labs  Lab 06/23/20 2201 06/24/20 0120 06/24/20 0604 06/24/20 0941 06/24/20 1205  GLUCAP 77 82 94 244* 156*   Lipid Profile: No results for input(s): CHOL, HDL, LDLCALC, TRIG, CHOLHDL, LDLDIRECT in the last 72 hours. Thyroid Function Tests:  No results for input(s): TSH, T4TOTAL, FREET4, T3FREE, THYROIDAB in the last 72 hours. Anemia Panel: No results for input(s): VITAMINB12, FOLATE, FERRITIN, TIBC, IRON, RETICCTPCT in the last 72 hours. Sepsis Labs: No results for input(s): PROCALCITON, LATICACIDVEN in the last 168 hours.  Recent Results (from the past 240 hour(s))  SARS Coronavirus 2 by RT PCR (hospital order, performed in [facility] hospital lab) Nasopharyngeal Nasopharyngeal Swab     Status: None   Collection Time: 06/22/20  6:37 AM   Specimen: Nasopharyngeal Swab  Result Value Ref Range Status   SARS Coronavirus 2 NEGATIVE NEGATIVE Final    Comment: (NOTE) SARS-CoV-2 target nucleic acids are NOT DETECTED.  The SARS-CoV-2 RNA is generally detectable in upper and lower respiratory specimens during the acute phase of infection. The lowest concentration of SARS-CoV-2 viral copies this assay can detect is 250 copies / mL. A negative result does not preclude SARS-CoV-2 infection and should not be used as the sole basis for treatment or other patient  management decisions.  A negative result may occur with improper specimen collection / handling, submission of specimen other than nasopharyngeal swab, presence of viral mutation(s) within the areas targeted by this assay, and inadequate number of viral copies (<250 copies / mL). A negative result must be combined with clinical observations, patient history, and epidemiological information.  Fact Sheet for Patients:   BoilerBrush.com.cy  Fact Sheet for Healthcare Providers: https://pope.com/  This test is not yet approved or  cleared by the [place] FDA and has been authorized for detection and/or diagnosis of SARS-CoV-2 by FDA under an Emergency Use Authorization (EUA).  This EUA will remain in effect (meaning this test can be used) for the duration of the COVID-19 declaration under Section 564(b)(1) of the Act, 21 U.S.C. section 360bbb-3(b)(1), unless the authorization is terminated or revoked sooner.  Performed at [facility], [address] [place], [place] [address]      Radiology Studies: No results found. Scheduled Meds: . DULoxetine  40 mg Oral Daily  . enoxaparin (LOVENOX) injection  60 mg Subcutaneous Q24H  . fluticasone  2 spray Each Nare Daily  . furosemide  80 mg Intravenous BID  . insulin aspart  0-20 Units Subcutaneous TID WC  . insulin aspart  0-5 Units Subcutaneous QHS  . insulin aspart protamine- aspart  100 Units Subcutaneous BID WC  . loratadine  10 mg Oral Daily  . losartan  50 mg Oral Daily  . metoprolol succinate  100 mg Oral Daily  . mometasone-formoterol  2 puff Inhalation BID  . nicotine  14 mg Transdermal Daily  . pantoprazole  40 mg Oral Daily  . potassium chloride SA  20 mEq Oral Daily  . rosuvastatin  5 mg Oral Daily  . sodium chloride flush  3 mL Intravenous Q12H  . tiZANidine  6 mg Oral TID  . umeclidinium bromide  2 puff Inhalation Daily   Continuous Infusions: . sodium  chloride       LOS: 1 day    Time spent: 25 mins.    Cipriano Bunker, MD [facility]   If 7PM-7AM, please contact night-coverage

## 2020-06-24 NOTE — Progress Notes (Cosign Needed)
    Durable Medical Equipment  (From admission, onward)         Start     Ordered   06/24/20 1656  For home use only DME lightweight manual wheelchair with seat cushion  Once       Comments: Bariatric light weight w/chair  Patient suffers from weakness which impairs their ability to perform daily activities like dressing and bathing in the home.  A walker or cane will not resolve  issue with performing activities of daily living. A wheelchair will allow patient to safely perform daily activities. Patient is not able to propel themselves in the home using a standard weight wheelchair due to weakness. Patient can self propel in the lightweight wheelchair. Length of need lifetime. Accessories: elevating leg rests (ELRs), wheel locks, extensions and anti-tippers.   06/24/20 1658   06/24/20 1655  For home use only DME 3 n 1  Once       Comments: Bariatric   06/24/20 1655   06/24/20 1654  For home use only DME Shower stool  Once       Comments: Bariatric   06/24/20 1654

## 2020-06-24 NOTE — TOC Initial Note (Addendum)
Transition of Care [facility]) - Initial/Assessment Note    Patient Details  Name: Jill Shaw MRN: [id number] Date of Birth: 11/14/1975  Transition of Care [facility]) CM/SW Contact:    Leone Haven, RN Phone Number: 06/24/2020, 5:07 PM  Clinical Narrative:                 Patient states she lives with her mom, she will need bariatric w/chair, 3 n1 and shower chair at dc. NCM made referral to [facility] with [employer] to see if she can get the DME thru charity.  She was getting outpatient pt on [address] and would like to continue that.  NCM made referral thru epic to continue the HHPT.   9/10- NCM received call from Cassie with [employer] stating patient's account is on hold, she will have to check into ,to see if can be taken off hold, she will get back with this NCM.  Expected Discharge Plan: Home/Self Care Barriers to Discharge: Continued Medical Work up   Patient Goals and CMS Choice Patient states their goals for this hospitalization and ongoing recovery are:: get better   Choice offered to / list presented to : NA  Expected Discharge Plan and Services Expected Discharge Plan: Home/Self Care   Discharge Planning Services: CM Consult Post Acute Care Choice: Durable Medical Equipment Living arrangements for the past 2 months: Single Family Home                 DME Arranged: Community education officer wheelchair with seat cushion, 3-N-1, Shower stool DME Agency: [employer] Date DME Agency Contacted: 06/24/20 Time DME Agency Contacted: [phone] Representative spoke with at DME Agency: [facility] HH Arranged: NA          Prior Living Arrangements/Services Living arrangements for the past 2 months: Single Family Home Lives with:: Parents Patient language and need for interpreter reviewed:: Yes Do you feel safe going back to the place where you live?: Yes      Need for Family Participation in Patient Care: Yes (Comment) Care giver support system in place?: Yes (comment)   Criminal  Activity/Legal Involvement Pertinent to Current Situation/Hospitalization: No - Comment as needed  Activities of Daily Living      Permission Sought/Granted                  Emotional Assessment Appearance:: Appears stated age Attitude/Demeanor/Rapport: Engaged Affect (typically observed): Appropriate Orientation: : Oriented to Self, Oriented to Place, Oriented to  Time, Oriented to Situation Alcohol / Substance Use: Not Applicable Psych Involvement: No (comment)  Admission diagnosis:  Shortness of breath [R06.02] Acute on chronic diastolic (congestive) heart failure (HCC) [I50.33] Patient Active Problem List   Diagnosis Date Noted  . Acute on chronic diastolic (congestive) heart failure (HCC) 06/22/2020  . Abdominal pain 03/08/2020  . Chronic diastolic CHF (congestive heart failure) (HCC) 03/08/2020  . Acute encephalopathy 01/29/2020  . AKI (acute kidney injury) (HCC) 01/29/2020  . Symptomatic anemia 12/19/2019  . Elevated sed rate 11/26/2019  . Elevated C-reactive protein (CRP) 11/26/2019  . Hypoglycemia 02/07/2019  . Hypotension 02/07/2019  . Lactic acidosis 02/07/2019  . Right ankle pain 02/07/2019  . Chronic obstructive pulmonary disease (HCC) 02/05/2019  . Seizure (HCC) 07/31/2018  . Vitamin D deficiency 05/31/2017  . Gastroesophageal reflux disease 05/24/2017  . Abnormal uterine bleeding (AUB) 05/17/2017  . Anemia of chronic disease 04/06/2017  . Knee pain, chronic 03/27/2016  . Type 2 diabetes mellitus without complication, with long-term current use of insulin (HCC) 03/27/2016  .  Essential hypertension 03/27/2016  . Morbid obesity (HCC) 03/27/2016  . Irritable bowel syndrome with constipation 03/27/2016  . Tobacco dependence 03/27/2016  . CKD (chronic kidney disease) stage 2, GFR 60-89 ml/min 03/25/2016  . Arthropathy, lower leg 05/04/2013  . CTS (carpal tunnel syndrome) 05/04/2013  . Peripheral edema 05/04/2013  . Chronic pain 05/13/2012  . Diabetic  gastroparesis (HCC) 11/06/2006  . Hot flashes 11/06/2006   PCP:  Barbette Merino, NP Pharmacy:   [facility] [place], [place] - [place] [address] [place] [address] Phone: [phone] Fax: [phone]  [facility] [address] [place] ([place]), [place] - [address] [place] [address] Phone: [phone] Fax: [phone]     Social Determinants of Health (SDOH) Interventions    Readmission Risk Interventions Readmission Risk Prevention Plan 06/24/2020  Transportation Screening Complete  HRI or Home Care Consult Complete  Social Work Consult for Recovery Care Planning/Counseling Complete  Palliative Care Screening Not Applicable  Medication Review Oceanographer) Complete  Some recent data might be hidden

## 2020-06-24 NOTE — Progress Notes (Signed)
Inpatient Diabetes Program Recommendations  AACE/ADA: New Consensus Statement on Inpatient Glycemic Control (2015)  Target Ranges:  Prepandial:   less than 140 mg/dL      Peak postprandial:   less than 180 mg/dL (1-2 hours)      Critically ill patients:  140 - 180 mg/dL   Lab Results  Component Value Date   GLUCAP 156 (H) 06/24/2020   HGBA1C 9.0 (A) 06/02/2020   HGBA1C 9.0 06/02/2020   HGBA1C 9.0 (A) 06/02/2020   HGBA1C 9.0 (A) 06/02/2020    Review of Glycemic Control Results for GARA, KINCADE (MRN [id number]) as of 06/24/2020 14:12  Ref. Range 06/24/2020 01:20 06/24/2020 06:04 06/24/2020 09:41 06/24/2020 12:05  Glucose-Capillary Latest Ref Range: 70 - 99 mg/dL 82 94 591 (H) 638 (H)   Diabetes history: Type 2 DM Outpatient Diabetes medications: Humalog 75/25 100 units BID, Onglyza 2.5 mg QD Current orders for Inpatient glycemic control: Novolog 70/30 100 units BID, Novolog 0-20 units TID, Novolog 0-5 units QHS  Inpatient Diabetes Program Recommendations:    Noted hypoglycemia yesterday following both doses of 70/30. May want to slightly decrease Novolog 70/30 to 90 units BID.   Thanks, Lujean Rave, MSN, RNC-OB Diabetes Coordinator [phone] (8a-5p)

## 2020-06-24 NOTE — Progress Notes (Addendum)
Progress Note  Patient Name: Jill Shaw Date of Encounter: 06/24/2020  [facility] Cardiologist: Nanetta BattyJonathan Cashton Hosley, MD   Subjective   Feels her breathing is better this morning. No chest pain.   Inpatient Medications    Scheduled Meds: . DULoxetine  40 mg Oral Daily  . enoxaparin (LOVENOX) injection  60 mg Subcutaneous Q24H  . fluticasone  2 spray Each Nare Daily  . furosemide  80 mg Intravenous BID  . insulin aspart  0-20 Units Subcutaneous TID WC  . insulin aspart  0-5 Units Subcutaneous QHS  . insulin aspart protamine- aspart  100 Units Subcutaneous BID WC  . loratadine  10 mg Oral Daily  . losartan  50 mg Oral Daily  . metoprolol succinate  100 mg Oral Daily  . mometasone-formoterol  2 puff Inhalation BID  . nicotine  14 mg Transdermal Daily  . pantoprazole  40 mg Oral Daily  . potassium chloride SA  20 mEq Oral Daily  . rosuvastatin  5 mg Oral Daily  . sodium chloride flush  3 mL Intravenous Q12H  . tiZANidine  6 mg Oral TID  . umeclidinium bromide  2 puff Inhalation Daily   Continuous Infusions: . sodium chloride     PRN Meds: sodium chloride, albuterol, oxyCODONE, sodium chloride flush   Vital Signs    Vitals:   06/24/20 0015 06/24/20 0357 06/24/20 0804 06/24/20 0842  BP: 122/78 117/71 (!) 125/53   Pulse: 66 67 73   Resp: 18 18 20    Temp: 97.7 F (36.5 C) 98.6 F (37 C) 98.6 F (37 C)   TempSrc: Oral Oral Oral   SpO2: 100% 100% 99% 100%  Weight:  123.9 kg    Height:        Intake/Output Summary (Last 24 hours) at 06/24/2020 0857 Last data filed at 06/24/2020 0533 Gross per 24 hour  Intake 1080 ml  Output 800 ml  Net 280 ml   Last 3 Weights 06/24/2020 06/23/2020 06/22/2020  Weight (lbs) 273 lb 1.6 oz 274 lb 11.2 oz 276 lb 9.6 oz  Weight (kg) 123.877 kg 124.603 kg 125.465 kg      Telemetry    NSR - Personally Reviewed  ECG    No new tracing this morning - Personally Reviewed  Physical Exam  Pleasant AAF GEN: No acute distress.   Neck:  difficult to assess JVD to neck girth Cardiac: RRR, no murmurs, rubs, or gallops.  Respiratory: Clear to auscultation bilaterally. GI: Soft, nontender, non-distended  MS: 1+ pitting edema to BLE; No deformity. Neuro:  Nonfocal  Psych: Normal affect   Labs    High Sensitivity Troponin:   Recent Labs  Lab 06/22/20 0636 06/22/20 0829  TROPONINIHS 103* 86*      Chemistry Recent Labs  Lab 06/22/20 0636 06/23/20 0338  NA 139 136  K 3.6 4.5  CL 104 100  CO2 23 27  GLUCOSE 178* 257*  BUN 8 19  CREATININE 1.17* 1.25*  CALCIUM 8.9 9.0  GFRNONAA 57* 53*  GFRAA >60 >60  ANIONGAP 12 9     Hematology Recent Labs  Lab 06/22/20 0636 06/23/20 0702  WBC 12.4* 13.9*  RBC 4.22 4.19  HGB 8.3* 8.2*  HCT 29.3* 28.3*  MCV 69.4* 67.5*  MCH 19.7* 19.6*  MCHC 28.3* 29.0*  RDW 23.9* 23.4*  PLT 235 258    BNP Recent Labs  Lab 06/22/20 0636  BNP 266.3*     DDimer No results for input(s): DDIMER in the  last 168 hours.   Radiology    VAS [place] LOWER EXTREMITY VENOUS (DVT) (ONLY [facility] & [facility])  Result Date: 06/22/2020  Lower Venous DVTStudy Indications: Edema.  Comparison Study: No prior studies. Performing Technologist: Jean Rosenthal  Examination Guidelines: A complete evaluation includes B-mode imaging, spectral Doppler, color Doppler, and power Doppler as needed of all accessible portions of each vessel. Bilateral testing is considered an integral part of a complete examination. Limited examinations for reoccurring indications may be performed as noted. The reflux portion of the exam is performed with the patient in reverse Trendelenburg.  +---------+---------------+---------+-----------+----------+-------------------+ RIGHT    CompressibilityPhasicitySpontaneityPropertiesThrombus Aging      +---------+---------------+---------+-----------+----------+-------------------+ CFV      Full           Yes      Yes                                       +---------+---------------+---------+-----------+----------+-------------------+ SFJ      Full                                                             +---------+---------------+---------+-----------+----------+-------------------+ FV Prox  Full                                                             +---------+---------------+---------+-----------+----------+-------------------+ FV Mid   Full                                                             +---------+---------------+---------+-----------+----------+-------------------+ FV DistalFull                                                             +---------+---------------+---------+-----------+----------+-------------------+ PFV      Full                                                             +---------+---------------+---------+-----------+----------+-------------------+ POP      Full           Yes      Yes                                      +---------+---------------+---------+-----------+----------+-------------------+ PTV      Full                                                             +---------+---------------+---------+-----------+----------+-------------------+  PERO     Full                                         Some segments not                                                         visualized.         +---------+---------------+---------+-----------+----------+-------------------+   +----+---------------+---------+-----------+----------+--------------+ LEFTCompressibilityPhasicitySpontaneityPropertiesThrombus Aging +----+---------------+---------+-----------+----------+--------------+ CFV Full           Yes      Yes                                 +----+---------------+---------+-----------+----------+--------------+     Summary: RIGHT: - There is no evidence of deep vein thrombosis in the lower extremity.  - No cystic structure found in the  popliteal fossa.   *See table(s) above for measurements and observations. Electronically signed by Waverly Ferrari MD on 06/22/2020 at 12:54:44 PM.    Final     Cardiac Studies   N/a   Patient Profile     44 y.o. female  with a history of chronic diastolic CHF, labile hypertension with multiple ED visit for hypotension, type 2 diabetes mellitus, COPD with ongoing tobacco abuse, GERD, seizures, sickle cell trait, dysfunction uterine bleeding s/p ablation and morbid obesity with BMI of 47.15 who was for the evaluation of CHF at the request of Dr. Ophelia Charter.  Assessment & Plan    1. Acute on Chronic Diastolic CHF: Patient presented with progressive shortness of breath. Likely multifactorial. BNP was mildly elevated at 266. - Echo in 04/2020 showed LVEF of 55-60% with grade 1 diastolic dysfunction.  - Started on IV Lasix 40mg  twice daily which was increased to 80mg  BID yesterday. Net - 1360L. Cr pending this morning, is stable would continue for today with same as she still has LE edema.  - Continue Losartan 50mg  daily and Toprol-XL 100mg  daily. - Continue to monitor daily weights, strict I/O's, and renal function.  2. Demand Ischemia: High-sensitivity troponin minimally elevated at 103 >> 86. Not consistent with ACS. No chest pain this morning.  - She does have multiple CV risk factors including HTN, HLD, poorly controlled, DM, tobacco abuse, and family history. May benefit from ischemic evaluation as an outpatient.   3. Syncope - Patient reports several episodes of syncope in the past. Most recently in June/July of this years. Occurred with pain so suspect vasovagal. No arrhythmias noted on telemetry.    4. Hypertension: stable this morning. - Continue Losartan and Toprol.  5. Hyperlipidemia - Recent lipid panel from 06/02/2020: Total Cholesterol 135, Triglycerides 166, HDL 29, LDL 77.  - Continue home Crestor 5mg  daily.  6. Type 2 Diabetes Mellitus  - Recent Hemoglobin A1c 9.0%. -  Management per primary team.  7. Morbid Obesity - BMI 47.5. - Needs lifestyle modifications. She would benefit from [facility].  8. Tobacco Abuse - She has a 21-22 year smoking history and has smoked 1/2 pack per day for the last 7-8 years.  - She knows she needs to quick but states she smokes when she gets stressed. -  Continue to emphasize importance of complete cessation.  Otherwise, per primary team: - COPD: continue inhalers - Chronic pain - Seizure disorder  For questions or updates, please contact [facility] Please consult [email or web address] for contact info under        Signed, Laverda Page, NP  06/24/2020, 8:57 AM     Agree with note by Laverda Page NP-C  Patient was admitted with volume overload and congestive heart failure.  She does have diastolic dysfunction as well as a history of ongoing tobacco abuse, medication and dietary noncompliance.  She was placed on 40 IV Lasix twice daily and just escalated to 80 mg IV twice daily.  She has only had 1.3 L out although she feels clinically improved.  She still has pitting edema.  Her lungs are clear.  We will continue her IV Lasix today and consider transitioning to p.o. tomorrow with anticipation for discharge in next 24 to 48 hours.  Her renal function has remained stable.   Runell Gess, M.D., FACP, [facility], [facility] [address] [place], [place]  [address]  [phone] 06/24/2020 10:01 AM

## 2020-06-24 NOTE — Plan of Care (Signed)
  Problem: Education: Goal: Knowledge of General Education information will improve Description Including pain rating scale, medication(s)/side effects and non-pharmacologic comfort measures Outcome: Progressing   Problem: Health Behavior/Discharge Planning: Goal: Ability to manage health-related needs will improve Outcome: Progressing   

## 2020-06-25 ENCOUNTER — Other Ambulatory Visit: Payer: Self-pay | Admitting: Nurse Practitioner

## 2020-06-25 ENCOUNTER — Telehealth: Payer: Self-pay | Admitting: Nurse Practitioner

## 2020-06-25 LAB — GLUCOSE, CAPILLARY
Glucose-Capillary: 53 mg/dL — ABNORMAL LOW (ref 70–99)
Glucose-Capillary: 71 mg/dL (ref 70–99)
Glucose-Capillary: 127 mg/dL — ABNORMAL HIGH (ref 70–99)
Glucose-Capillary: 207 mg/dL — ABNORMAL HIGH (ref 70–99)

## 2020-06-25 LAB — BASIC METABOLIC PANEL
Anion gap: 11 (ref 5–15)
BUN: 21 mg/dL — ABNORMAL HIGH (ref 6–20)
CO2: 28 mmol/L (ref 22–32)
Calcium: 8.8 mg/dL — ABNORMAL LOW (ref 8.9–10.3)
Chloride: 101 mmol/L (ref 98–111)
Creatinine, Ser: 1.27 mg/dL — ABNORMAL HIGH (ref 0.44–1.00)
GFR calc Af Amer: 60 mL/min — ABNORMAL LOW (ref >60)
GFR calc non Af Amer: 52 mL/min — ABNORMAL LOW (ref >60)
Glucose, Bld: 139 mg/dL — ABNORMAL HIGH (ref 70–99)
Potassium: 3.6 mmol/L (ref 3.5–5.1)
Sodium: 140 mmol/L (ref 135–145)

## 2020-06-25 LAB — HEMOGLOBIN AND HEMATOCRIT, BLOOD
HCT: 31.9 % — ABNORMAL LOW (ref 36.0–46.0)
Hemoglobin: 9.2 g/dL — ABNORMAL LOW (ref 12.0–15.0)

## 2020-06-25 MED ORDER — TIZANIDINE HCL 2 MG PO TABS
6.0000 mg | ORAL_TABLET | Freq: Three times a day (TID) | ORAL | 0 refills | Status: DC | PRN
Start: 2020-06-25 — End: 2020-06-25

## 2020-06-25 MED ORDER — POTASSIUM CHLORIDE CRYS ER 20 MEQ PO TBCR
20.0000 meq | EXTENDED_RELEASE_TABLET | Freq: Once | ORAL | Status: AC
  Administered 2020-06-25: 20 meq via ORAL
  Filled 2020-06-25: qty 1

## 2020-06-25 MED ORDER — TIZANIDINE HCL 2 MG PO TABS
6.0000 mg | ORAL_TABLET | Freq: Three times a day (TID) | ORAL | 2 refills | Status: DC | PRN
Start: 2020-06-25 — End: 2020-07-16

## 2020-06-25 MED ORDER — GLUCOSE 40 % PO GEL
2.0000 | ORAL | Status: AC
  Administered 2020-06-25: 75 g via ORAL

## 2020-06-25 MED ORDER — FUROSEMIDE 40 MG PO TABS: 40.0000 mg | ORAL_TABLET | Freq: Two times a day (BID) | ORAL | 0 refills | Status: DC

## 2020-06-25 MED ORDER — INSULIN ASPART PROT & ASPART (70-30 MIX) 100 UNIT/ML ~~LOC~~ SUSP
90.0000 [IU] | Freq: Two times a day (BID) | SUBCUTANEOUS | Status: DC
  Administered 2020-06-25: 90 [IU] via SUBCUTANEOUS
  Filled 2020-06-25: qty 10

## 2020-06-25 MED ORDER — FUROSEMIDE 80 MG PO TABS: 80.0000 mg | ORAL_TABLET | Freq: Two times a day (BID) | ORAL | Status: DC

## 2020-06-25 MED ORDER — GLUCOSE 40 % PO GEL
ORAL | Status: AC
  Filled 2020-06-25: qty 2

## 2020-06-25 MED FILL — tiZANidine HCL 2 MG TABS: 2 | 30 days supply | Qty: 30 | Fill #0

## 2020-06-25 MED FILL — FUROSEMIDE 40 MG TAB: 40 | 30 days supply | Qty: 60 | Fill #0

## 2020-06-25 NOTE — Telephone Encounter (Signed)
Refill sent.

## 2020-06-25 NOTE — Discharge Summary (Signed)
Physician Discharge Summary  Jill Shaw YTK:[id number] DOB: [birth year] DOA: 06/22/2020  PCP: Vevelyn Francois, NP  Admit date: 06/22/2020  Discharge date: 06/25/2020  Admitted From:  Home. Disposition:  Home.  Recommendations for Outpatient Follow-up:  Follow up with PCP in 1-2 weeks. Please obtain BMP/CBC in one week. Advised to follow-up with cardiology as scheduled in 1 week. Patient's Lasix has been increased from 40 mg daily to twice daily.  Home Health: None. Equipment/Devices: None.  Discharge Condition: Stable CODE STATUS:Full code Diet recommendation: Heart Healthy  Brief Summary: Jill Shaw is a 44 y.o. female with medical history significant of morbid obesity; tobacco dependence; seizures; HTN; DM; and COPD presenting with progressive shortness of breath.  She reports that she felt her chest was tightening up and she couldn't breathe. She reports  +LE edema worse than usual.  +orthopnea.  +PND.  Grade 1 diastolic CHF on echo in 02/5731. SOB likely 2* to CHF.  CXR appears to be consistent with this CHF.  Hospital course: She was admitted for CHF exacerbation.  Echocardiogram recently completed.  She continues to have significant shortness of breath,   Venous duplex right LE : No DVT. Echo in 04/2020 showed LVEF of 55-60% with grade 1 diastolic dysfunction.  Cardiology consulted.  Patient required  aggressive diuresis for 48 hours. Her Lasix was increased to 80 mg BID, she has 2.5 Liters negative balance, feels much better, Leg swelling has resolved, She is cleared from cardiology to be discharged, Patient is being discharged on slightly higher dose of Lasix.  Her Lasix dose was increased from 40 mg p.o. daily to 40 mg p.o. twice daily.  And she will follow up with cardiology.  Patient also follows with endocrinology.  Patient is being discharged home.   She was managed for below problems.  Discharge Diagnoses:  Principal Problem:   Acute on chronic diastolic (congestive)  heart failure (HCC) Active Problems:   Type 2 diabetes mellitus without complication, with long-term current use of insulin (HCC)   Essential hypertension   Morbid obesity (HCC)   Tobacco dependence   Seizure (HCC)   Chronic obstructive pulmonary disease (HCC)   Chronic pain  Acute on chronic diastolic CHF -Patient with known h/o chronic diastolic CHF presenting with worsening SOB  -Recent echo (04/2020) with grade 1 diastolic dysfunction -CXR consistent with mild pulmonary edema -Mildly elevated BNP, similar to prior. -With elevated BNP and abnl CXR, acute decompensated CHF seems probable as diagnosis. -Continue 81 mg ASA -continue ARB, BB -CHF order set utilized -Was given Lasix 40 mg x 1 in ER, increased to lasix 80 mg IV  BID. -Can give [place] O2 if needed. -Serum creatinine trending up. Recheck in am. -Mildly elevated HS troponin is likely related to demand ischemia; doubt ACS based on symptoms -Cardio consulted,  Patient requires aggressive diuresis for next 24 to 48 hours. -Lasix increased to 80 mg IV twice daily, patient feels much better,  able to breathe better. -Switch to p.o. Lasix 40 twice daily  at discharge tomorrow.     HTN -Continue home BP meds (Cozaar, Toprol XL) .   HLD -Continue Crestor -Lipids were checked on 8/18 (TC 135, HDL 29, LDL 77, TG 166) so will not repeat at this time   DM -Recent A1c was 9.0 on 8/18, indicating poor control -Continue high-dose insulin 75/25. -Hold Onglyza -Cover with resistant-scale SSI   Chronic pain -Interestingly, while she lists Norco as a current medication,   PDMP review indicates  that she is not receiving chronic controled substances as an outpatient -Continue Cymbalta, Norco for now.   COPD -Continue Symbicort, Spiriva, Zyrtec, Flonase, and prn Albuterol   Seizure d/o. -She does not appear to be taking anti-seizure medication at this time. -She denies any recent seizures.   Tobacco dependence -Encourage  cessation.   -This was discussed with the patient and should be reviewed on an ongoing basis.   -Patch requested   Morbid obesity -Body mass index is 46.35 kg/m.  -Weight loss should be encouraged. -Outpatient PCP/bariatric medicine/bariatric surgery f/u encouraged. -Hold norethindrone - with her weight, this medication increases her risk of VTE     Discharge Instructions  Discharge Instructions     Call MD for:  difficulty breathing, headache or visual disturbances   Complete by: As directed    Call MD for:  persistant dizziness or light-headedness   Complete by: As directed    Call MD for:  persistant nausea and vomiting   Complete by: As directed    Diet - low sodium heart healthy   Complete by: As directed    Diet Carb Modified   Complete by: As directed    Discharge instructions   Complete by: As directed    Advised to follow-up with primary care physician in 1 week. Advised to follow-up with cardiology as scheduled in 1 week. Patient's Lasix has been increased from 40 mg daily to twice daily.   Increase activity slowly   Complete by: As directed       Allergies as of 06/25/2020       Reactions   Ketoprofen Nausea And Vomiting   Aspirin Nausea Only   Gabapentin Nausea And Vomiting   upset stomach   Ibuprofen Nausea And Vomiting   Liraglutide Nausea And Vomiting   Naproxen Nausea And Vomiting   Omeprazole-sodium Bicarbonate Nausea And Vomiting   Sulfa Antibiotics Nausea And Vomiting   Tramadol Nausea And Vomiting   stomach upset        Medication List     TAKE these medications    albuterol (2.5 MG/3ML) 0.083% nebulizer solution Commonly known as: PROVENTIL Take 3 mLs (2.5 mg total) by nebulization every 6 (six) hours as needed for wheezing or shortness of breath.   albuterol 108 (90 Base) MCG/ACT inhaler Commonly known as: VENTOLIN HFA Inhale 2 puffs into the lungs every 6 (six) hours as needed for wheezing or shortness of breath.   blood glucose  meter kit and supplies Kit Dispense based on patient and insurance preference. Use up to four times daily as directed. (FOR ICD-9 250.00, 250.01). What changed:  how much to take how to take this when to take this   Blood Pressure Kit Devi Check BP BID prn What changed:  how much to take how to take this when to take this reasons to take this additional instructions   budesonide-formoterol 160-4.5 MCG/ACT inhaler Commonly known as: Symbicort INHALE 2 PUFFS INTO THE LUNGS 2 (TWO) TIMES DAILY. What changed:  how much to take how to take this when to take this additional instructions   cetirizine 10 MG tablet Commonly known as: ZYRTEC Take 1 tablet (10 mg total) by mouth daily.   diclofenac 75 MG EC tablet Commonly known as: VOLTAREN Take 1 tablet (75 mg total) by mouth 2 (two) times daily as needed. What changed: reasons to take this   DULoxetine HCl 40 MG Cpep Take 40 mg by mouth daily.   ferrous sulfate 325 (65 FE) MG  tablet Take 1 tablet (325 mg total) by mouth 3 (three) times daily with meals. What changed: when to take this   fluticasone 50 MCG/ACT nasal spray Commonly known as: FLONASE Place 2 sprays into both nostrils daily.   furosemide 40 MG tablet Commonly known as: LASIX Take 1 tablet (40 mg total) by mouth 2 (two) times daily. What changed: when to take this   Glucosamine Sulfate 1000 MG Caps Take 1 capsule (1,000 mg total) by mouth 2 (two) times daily.   hydrocortisone 2.5 % cream Apply topically 2 (two) times daily.   hydroquinone 4 % cream Apply topically 2 (two) times daily.   Insulin Lispro Prot & Lispro (75-25) 100 UNIT/ML Kwikpen Commonly known as: HumaLOG Mix 75/25 KwikPen INJECT 100 UNITS EVERY 12 HOURS What changed:  how much to take how to take this when to take this additional instructions   losartan 50 MG tablet Commonly known as: COZAAR Take 1 tablet (50 mg total) by mouth daily.   metoprolol succinate 100 MG 24 hr  tablet Commonly known as: TOPROL-XL Take 1 tablet (100 mg total) by mouth daily. Take with or immediately following a meal.   norethindrone 5 MG tablet Commonly known as: Aygestin Take 2 tablets (10 mg total) by mouth in the morning, at noon, in the evening, and at bedtime.   omeprazole 40 MG capsule Commonly known as: PRILOSEC Take 1 capsule (40 mg total) by mouth daily.   Onglyza 2.5 MG Tabs tablet Generic drug: saxagliptin HCl Take 1 tablet (2.5 mg total) by mouth daily.   oxyCODONE 5 MG immediate release tablet Commonly known as: Roxicodone Take 1-2 tablets (5-10 mg total) by mouth every 6 (six) hours as needed for severe pain.   Pen Needles 30G X 5 MM Misc 1 Units by Does not apply route as directed.   potassium chloride SA 20 MEQ tablet Commonly known as: KLOR-CON Take 1 tablet (20 mEq total) by mouth daily.   promethazine 12.5 MG tablet Commonly known as: PHENERGAN Take 1 tablet (12.5 mg total) by mouth every 6 (six) hours as needed for nausea or vomiting.   promethazine 25 MG tablet Commonly known as: PHENERGAN Take 1 tablet (25 mg total) by mouth every 6 (six) hours as needed for nausea or vomiting.   rosuvastatin 5 MG tablet Commonly known as: Crestor Take 1 tablet (5 mg total) by mouth daily.   Spiriva Respimat 2.5 MCG/ACT Aers Generic drug: Tiotropium Bromide Monohydrate Inhale 2 puffs into the lungs daily.   tizanidine 6 MG capsule Commonly known as: ZANAFLEX TAKE 1 CAPSULE (6 MG TOTAL) BY MOUTH 3 (THREE) TIMES DAILY.   tranexamic acid 650 MG Tabs tablet Commonly known as: LYSTEDA Take 2 tablets (1,300 mg total) by mouth 3 (three) times daily. Take during menses for a maximum of five days   True Metrix Blood Glucose Test test strip Generic drug: glucose blood Use as instructed   True Metrix Meter w/Device Kit 1 each by Does not apply route 4 (four) times daily -  before meals and at bedtime.   TRUEplus Lancets 28G Misc 1 each by Other route in  the morning, at noon, in the evening, and at bedtime.   Turmeric 500 MG Caps Take 500 mg by mouth 2 (two) times daily.   Vitamin D (Ergocalciferol) 1.25 MG (50000 UNIT) Caps capsule Commonly known as: DRISDOL Take 1 capsule (50,000 Units total) by mouth every 7 (seven) days. What changed: when to take this   Vitamin  D-3 125 MCG (5000 UT) Tabs Take 1 tablet by mouth daily. What changed: how much to take               Durable Medical Equipment  (From admission, onward)           Start     Ordered   06/24/20 1710  For home use only DME wheelchair cushion (seat and back)  Once        06/24/20 1709   06/24/20 1656  For home use only DME lightweight manual wheelchair with seat cushion  Once       Comments: Bariatric light weight w/chair  Patient suffers from weakness which impairs their ability to perform daily activities like dressing and bathing in the home.  A walker or cane will not resolve  issue with performing activities of daily living. A wheelchair will allow patient to safely perform daily activities. Patient is not able to propel themselves in the home using a standard weight wheelchair due to weakness. Patient can self propel in the lightweight wheelchair. Length of need lifetime. Accessories: elevating leg rests (ELRs), wheel locks, extensions and anti-tippers.   06/24/20 1658   06/24/20 1655  For home use only DME 3 n 1  Once       Comments: Bariatric   06/24/20 1655   06/24/20 1654  For home use only DME Shower stool  Once       Comments: Bariatric   06/24/20 1654            Follow-up Information     [facility] Follow up.   Why: wheelchair, 3 n 1, shower chair Contact information: [address] [place] [address] [phone]         Ronney Asters, NP Follow up on 07/07/2020.   Specialty: Cardiology Why: at 11:15am for your follow up appt.  Contact information: [address] [place]  [address] [phone]         Barbette Merino, NP Follow up in 1 week(s).   Specialty: Adult Health Nurse Practitioner Contact information: [address] [name] [address] [phone]         Runell Gess, MD .   Specialties: Cardiology, Radiology Contact information: [address] [place] [address] [phone]                Allergies  Allergen Reactions   Ketoprofen Nausea And Vomiting   Aspirin Nausea Only   Gabapentin Nausea And Vomiting    upset stomach   Ibuprofen Nausea And Vomiting   Liraglutide Nausea And Vomiting   Naproxen Nausea And Vomiting   Omeprazole-Sodium Bicarbonate Nausea And Vomiting   Sulfa Antibiotics Nausea And Vomiting   Tramadol Nausea And Vomiting    stomach upset    Consultations: Cardiology    Procedures/Studies: DG Chest Port 1 View  Result Date: 06/22/2020 CLINICAL DATA:  Shortness of breath. EXAM: PORTABLE CHEST 1 VIEW COMPARISON:  CT 03/08/2020.  Chest x-ray 03/08/2020. FINDINGS: Cardiomegaly with diffuse bilateral pulmonary interstitial prominence suggesting CHF. Pneumonitis cannot be excluded. No prominent pleural effusion. No pneumothorax. IMPRESSION: Cardiomegaly with diffuse bilateral pulmonary interstitial prominence suggesting CHF. Pneumonitis cannot be excluded. Electronically Signed   By: Maisie Fus  Register   On: 06/22/2020 06:47   DG Foot Complete Left  Result Date: 05/31/2020 Please see detailed radiograph report in office note.  VAS [place] LOWER EXTREMITY VENOUS (DVT) (ONLY [facility] & [facility])  Result Date: 06/22/2020  Lower Venous DVTStudy Indications: Edema.  Comparison Study: No prior studies. Performing Technologist: Darlin Coco  Examination Guidelines: A complete evaluation includes B-mode imaging, spectral Doppler, color Doppler, and power Doppler as needed of all accessible portions of each vessel. Bilateral testing is considered an integral part of a complete examination. Limited  examinations for reoccurring indications may be performed as noted. The reflux portion of the exam is performed with the patient in reverse Trendelenburg.  +---------+---------------+---------+-----------+----------+-------------------+ RIGHT    CompressibilityPhasicitySpontaneityPropertiesThrombus Aging      +---------+---------------+---------+-----------+----------+-------------------+ CFV      Full           Yes      Yes                                      +---------+---------------+---------+-----------+----------+-------------------+ SFJ      Full                                                             +---------+---------------+---------+-----------+----------+-------------------+ FV Prox  Full                                                             +---------+---------------+---------+-----------+----------+-------------------+ FV Mid   Full                                                             +---------+---------------+---------+-----------+----------+-------------------+ FV DistalFull                                                             +---------+---------------+---------+-----------+----------+-------------------+ PFV      Full                                                             +---------+---------------+---------+-----------+----------+-------------------+ POP      Full           Yes      Yes                                      +---------+---------------+---------+-----------+----------+-------------------+ PTV      Full                                                             +---------+---------------+---------+-----------+----------+-------------------+  PERO     Full                                         Some segments not                                                         visualized.         +---------+---------------+---------+-----------+----------+-------------------+    +----+---------------+---------+-----------+----------+--------------+ LEFTCompressibilityPhasicitySpontaneityPropertiesThrombus Aging +----+---------------+---------+-----------+----------+--------------+ CFV Full           Yes      Yes                                 +----+---------------+---------+-----------+----------+--------------+     Summary: RIGHT: - There is no evidence of deep vein thrombosis in the lower extremity.  - No cystic structure found in the popliteal fossa.   *See table(s) above for measurements and observations. Electronically signed by Deitra Mayo MD on 06/22/2020 at 12:54:44 PM.    Final      Subjective: Patient was seen and examined at bedside.  Overnight events noted.  Patient reports feeling much better.  Leg swelling has significantly improved.  Lung sounds are very clear.  She feels wants to be discharged home.  Discharge Exam: Vitals:   06/25/20 0410 06/25/20 0917  BP: (!) 179/91 (!) 149/81  Pulse: 84 84  Resp: (!) 22   Temp: 98.3 F (36.8 C) 98.7 F (37.1 C)  SpO2: 97% 100%   Vitals:   06/24/20 1943 06/24/20 2032 06/25/20 0410 06/25/20 0917  BP:  138/79 (!) 179/91 (!) 149/81  Pulse:  81 84 84  Resp:  20 (!) 22   Temp:  98.3 F (36.8 C) 98.3 F (36.8 C) 98.7 F (37.1 C)  TempSrc:  Oral Oral Oral  SpO2: 98% 100% 97% 100%  Weight:   121.6 kg   Height:        General: Pt is alert, awake, not in acute distress Cardiovascular: RRR, S1/S2 +, no rubs, no gallops Respiratory: CTA bilaterally, no wheezing, no rhonchi Abdominal: Soft, NT, ND, bowel sounds + Extremities: no edema, no cyanosis    The results of significant diagnostics from this hospitalization (including imaging, microbiology, ancillary and laboratory) are listed below for reference.     Microbiology: Recent Results (from the past 240 hour(s))  SARS Coronavirus 2 by RT PCR (hospital order, performed in [facility] hospital lab) Nasopharyngeal Nasopharyngeal Swab      Status: None   Collection Time: 06/22/20  6:37 AM   Specimen: Nasopharyngeal Swab  Result Value Ref Range Status   SARS Coronavirus 2 NEGATIVE NEGATIVE Final    Comment: (NOTE) SARS-CoV-2 target nucleic acids are NOT DETECTED.  The SARS-CoV-2 RNA is generally detectable in upper and lower respiratory specimens during the acute phase of infection. The lowest concentration of SARS-CoV-2 viral copies this assay can detect is 250 copies / mL. A negative result does not preclude SARS-CoV-2 infection and should not be used as the sole basis for treatment or other patient management decisions.  A negative result may occur with improper specimen collection / handling, submission of specimen other than nasopharyngeal swab, presence of  viral mutation(s) within the areas targeted by this assay, and inadequate number of viral copies (<250 copies / mL). A negative result must be combined with clinical observations, patient history, and epidemiological information.  Fact Sheet for Patients:   StrictlyIdeas.no  Fact Sheet for Healthcare Providers: BankingDealers.co.za  This test is not yet approved or  cleared by the [place] FDA and has been authorized for detection and/or diagnosis of SARS-CoV-2 by FDA under an Emergency Use Authorization (EUA).  This EUA will remain in effect (meaning this test can be used) for the duration of the COVID-19 declaration under Section 564(b)(1) of the Act, 21 U.S.C. section 360bbb-3(b)(1), unless the authorization is terminated or revoked sooner.  Performed at [facility], [place] [address] [place], [place] [address]      Labs: BNP (last 3 results) Recent Labs    02/02/20 0220 02/03/20 0341 06/22/20 0636  BNP 277.7* 125.5* 537.4*   Basic Metabolic Panel: Recent Labs  Lab 06/22/20 0636 06/23/20 0338 06/24/20 0828 06/25/20 0543  NA 139 136 138 140  K 3.6 4.5 4.0 3.6  CL 104 100 100 101   CO2 $Re'23 27 28 28  'lGE$ GLUCOSE 178* 257* 191* 139*  BUN 8 19 23* 21*  CREATININE 1.17* 1.25* 1.26* 1.27*  CALCIUM 8.9 9.0 8.9 8.8*  MG  --   --  1.7  --   PHOS  --   --  4.5  --    Liver Function Tests: No results for input(s): AST, ALT, ALKPHOS, BILITOT, PROT, ALBUMIN in the last 168 hours. No results for input(s): LIPASE, AMYLASE in the last 168 hours. No results for input(s): AMMONIA in the last 168 hours. CBC: Recent Labs  Lab 06/22/20 0636 06/23/20 0702 06/24/20 0828 06/25/20 0543  WBC 12.4* 13.9* 14.6*  --   NEUTROABS 8.4* 10.8*  --   --   HGB 8.3* 8.2* 9.1* 9.2*  HCT 29.3* 28.3* 31.5* 31.9*  MCV 69.4* 67.5* 68.5*  --   PLT 235 258 300  --    Cardiac Enzymes: No results for input(s): CKTOTAL, CKMB, CKMBINDEX, TROPONINI in the last 168 hours. BNP: Invalid input(s): POCBNP CBG: Recent Labs  Lab 06/24/20 2116 06/25/20 0217 06/25/20 0252 06/25/20 0618 06/25/20 1115  GLUCAP 135* 53* 71 127* 207*   D-Dimer No results for input(s): DDIMER in the last 72 hours. Hgb A1c No results for input(s): HGBA1C in the last 72 hours. Lipid Profile No results for input(s): CHOL, HDL, LDLCALC, TRIG, CHOLHDL, LDLDIRECT in the last 72 hours. Thyroid function studies No results for input(s): TSH, T4TOTAL, T3FREE, THYROIDAB in the last 72 hours.  Invalid input(s): FREET3 Anemia work up No results for input(s): VITAMINB12, FOLATE, FERRITIN, TIBC, IRON, RETICCTPCT in the last 72 hours. Urinalysis    Component Value Date/Time   COLORURINE STRAW (A) 03/08/2020 1040   APPEARANCEUR CLEAR 03/08/2020 1040   LABSPEC 1.030 03/08/2020 1040   PHURINE 6.0 03/08/2020 1040   GLUCOSEU >=500 (A) 03/08/2020 1040   HGBUR NEGATIVE 03/08/2020 1040   BILIRUBINUR negative 06/02/2020 1419   BILIRUBINUR NEGATIVE 11/05/2019 1342   KETONESUR negative 06/02/2020 1419   KETONESUR 20 (A) 03/08/2020 1040   PROTEINUR NEGATIVE 03/08/2020 1040   UROBILINOGEN 4.0 (A) 06/02/2020 1419   UROBILINOGEN 0.2  01/14/2018 1348   NITRITE Negative 06/02/2020 1419   NITRITE NEGATIVE 03/08/2020 1040   LEUKOCYTESUR Negative 06/02/2020 1419   LEUKOCYTESUR NEGATIVE 03/08/2020 1040   Sepsis Labs Invalid input(s): PROCALCITONIN,  WBC,  LACTICIDVEN Microbiology Recent Results (  from the past 240 hour(s))  SARS Coronavirus 2 by RT PCR (hospital order, performed in [facility] hospital lab) Nasopharyngeal Nasopharyngeal Swab     Status: None   Collection Time: 06/22/20  6:37 AM   Specimen: Nasopharyngeal Swab  Result Value Ref Range Status   SARS Coronavirus 2 NEGATIVE NEGATIVE Final    Comment: (NOTE) SARS-CoV-2 target nucleic acids are NOT DETECTED.  The SARS-CoV-2 RNA is generally detectable in upper and lower respiratory specimens during the acute phase of infection. The lowest concentration of SARS-CoV-2 viral copies this assay can detect is 250 copies / mL. A negative result does not preclude SARS-CoV-2 infection and should not be used as the sole basis for treatment or other patient management decisions.  A negative result may occur with improper specimen collection / handling, submission of specimen other than nasopharyngeal swab, presence of viral mutation(s) within the areas targeted by this assay, and inadequate number of viral copies (<250 copies / mL). A negative result must be combined with clinical observations, patient history, and epidemiological information.  Fact Sheet for Patients:   StrictlyIdeas.no  Fact Sheet for Healthcare Providers: BankingDealers.co.za  This test is not yet approved or  cleared by the [place] FDA and has been authorized for detection and/or diagnosis of SARS-CoV-2 by FDA under an Emergency Use Authorization (EUA).  This EUA will remain in effect (meaning this test can be used) for the duration of the COVID-19 declaration under Section 564(b)(1) of the Act, 21 U.S.C. section 360bbb-3(b)(1), unless the  authorization is terminated or revoked sooner.  Performed at [facility], [place] [address] [place], [place] [address]      Time coordinating discharge: Over 30 minutes  SIGNED:   Shawna Clamp, MD  [facility] 06/25/2020, 11:36 AM Pager   If 7PM-7AM, please contact night-coverage [email or web address]

## 2020-06-25 NOTE — TOC Transition Note (Signed)
Transition of Care [facility]) - CM/SW Discharge Note   Patient Details  Name: TONIMARIE GRITZ MRN: [id number] Date of Birth: September 14, 1976  Transition of Care [facility]) CM/SW Contact:  Leone Haven, RN Phone Number: 06/25/2020, 12:48 PM   Clinical Narrative:    Patient is for dc today, Cassie with [employer] states patient has a hold on her account , she received nebulizer, walker and a cane and has an outstanding balance for 185.12 and this will have to be paid before they can supply her anything else.  NCM informed patient of this information.  Patient states [facility] was suppose to pay for this at that time so she will call them and look into.  Patient is for dc today so she will probably not pay the 185.12.     Final next level of care: Home/Self Care Barriers to Discharge: No Barriers Identified   Patient Goals and CMS Choice Patient states their goals for this hospitalization and ongoing recovery are:: get better   Choice offered to / list presented to : NA  Discharge Placement                       Discharge Plan and Services   Discharge Planning Services: CM Consult Post Acute Care Choice: Durable Medical Equipment          DME Arranged: Lightweight manual wheelchair with seat cushion, 3-N-1, Shower stool DME Agency: NA Date DME Agency Contacted: 06/24/20 Time DME Agency Contacted: [id number] Representative spoke with at DME Agency: [facility] Arranged: NA          Social Determinants of Health (SDOH) Interventions     Readmission Risk Interventions Readmission Risk Prevention Plan 06/24/2020  Transportation Screening Complete  HRI or Home Care Consult Complete  Social Work Consult for Recovery Care Planning/Counseling Complete  Palliative Care Screening Not Applicable  Medication Review [employer]) Complete  Some recent data might be hidden

## 2020-06-25 NOTE — Progress Notes (Signed)
D/C instructions given and reviewed. Questions addressed, MD notified of concerns and primary RN notified. IV removed, tolerated well. Awaiting MD response, equipment delivery and transport.

## 2020-06-25 NOTE — Progress Notes (Signed)
Physical Therapy Treatment Patient Details Name: Jill Shaw MRN: [id number] DOB: 03/18/1976 Today's Date: 06/25/2020    History of Present Illness  Jill Shaw is a 44 y.o. female with medical history significant of morbid obesity; tobacco dependence; seizures; HTN; DM; and COPD presenting with SOB.  She reports that she felt her chest was tightening up and she couldn't breathe.  Symptoms started Sunday and worsened that night.  She felt a little better Monday Am but it worsened again that evening.      PT Comments    Pt fully participated in session, pt demonstrating improved gait tolerance with use of RW, O2 decreased from 98% to 93%; pt performed stairs with min guard assist with use of R railing, O2 dropped to 86% following stair negotiation with therapist providing education/demonstration on pursed lip breathing, O2 increased to 93%; pt given handouts, education and demonstration on pursed lip breathing, use of inhaler and energy conservation techniques; pt continues to demonstrate deficits in gait, strength and endurance and will benefit from continued skilled PT to address deficits and maximize independence with functional mobility   Follow Up Recommendations  Outpatient PT     Equipment Recommendations  3in1 (PT);Other (comment);Wheelchair (measurements PT);Wheelchair cushion (measurements PT)    Recommendations for Other Services       Precautions / Restrictions Precautions Precautions: Fall Restrictions Weight Bearing Restrictions: No    Mobility  Bed Mobility Overal bed mobility: Independent                Transfers Overall transfer level: Independent Equipment used: Rolling walker (2 wheeled)                Ambulation/Gait Ambulation/Gait assistance: Supervision Gait Distance (Feet): 200 Feet Assistive device: Rolling walker (2 wheeled) Gait Pattern/deviations: Step-through pattern;Decreased stride length;Wide base of support Gait velocity:  decreased   General Gait Details: pt O2 drop to 93% during ambulation   Stairs Stairs: Yes Stairs assistance: Min guard Stair Management: One rail Right Number of Stairs: 4 General stair comments: education to use side stepping when descending for B UE support on railing; education on LE sequencing on stairs; education on breathing technique due to SOB and O2 drop to 86%; increased time need to rest following stair negotiation   Wheelchair Mobility    Modified Rankin (Stroke Patients Only)       Balance Overall balance assessment: Modified Independent Sitting-balance support: No upper extremity supported Sitting balance-Leahy Scale: Normal     Standing balance support: No upper extremity supported;During functional activity Standing balance-Leahy Scale: Good Standing balance comment: STatic balance without UE support good; dynamic balance pt requiring use of B UEs with use of RW                            Cognition Arousal/Alertness: Awake/alert Behavior During Therapy: WFL for tasks assessed/performed Overall Cognitive Status: Within Functional Limits for tasks assessed                                        Exercises      General Comments        Pertinent Vitals/Pain Pain Assessment: No/denies pain    Home Living                      Prior Function  PT Goals (current goals can now be found in the care plan section) Acute Rehab PT Goals Patient Stated Goal: to go home PT Goal Formulation: With patient Time For Goal Achievement: 07/07/20 Potential to Achieve Goals: Good Progress towards PT goals: Progressing toward goals    Frequency    Min 3X/week      PT Plan Current plan remains appropriate    Co-evaluation              AM-PAC PT "6 Clicks" Mobility   Outcome Measure  Help needed turning from your back to your side while in a flat bed without using bedrails?: None Help needed moving  from lying on your back to sitting on the side of a flat bed without using bedrails?: None Help needed moving to and from a bed to a chair (including a wheelchair)?: None Help needed standing up from a chair using your arms (e.g., wheelchair or bedside chair)?: None Help needed to walk in hospital room?: None Help needed climbing 3-5 steps with a railing? : A Little 6 Click Score: 23    End of Session Equipment Utilized During Treatment: Gait belt Activity Tolerance: Patient limited by fatigue Patient left: Other (comment) (sitting EOB with lunch) Nurse Communication: Mobility status PT Visit Diagnosis: Unsteadiness on feet (R26.81);Muscle weakness (generalized) (M62.81)     Time: 7989-2119 PT Time Calculation (min) (ACUTE ONLY): 34 min  Charges:  $Gait Training: 23-37 mins                     Ginette Otto, DPT Acute Rehabilitation Services [phone]   Lucretia Field 06/25/2020, 12:50 PM

## 2020-06-25 NOTE — Plan of Care (Signed)

## 2020-06-25 NOTE — Discharge Instructions (Signed)
Advised to follow-up with primary care physician in 1 week. Advised to follow-up with cardiology as scheduled in 1 week. Patient's Lasix has been increased from 40 mg daily to twice daily.

## 2020-06-25 NOTE — Progress Notes (Addendum)
Progress Note  Patient Name: Jill Shaw Date of Encounter: 06/25/2020  [facility] Cardiologist: Nanetta Batty, MD   Subjective   Feeling well this morning. Breathing has much improved.   Inpatient Medications    Scheduled Meds:  DULoxetine  40 mg Oral Daily   enoxaparin (LOVENOX) injection  60 mg Subcutaneous Q24H   fluticasone  2 spray Each Nare Daily   furosemide  80 mg Intravenous BID   insulin aspart  0-20 Units Subcutaneous TID WC   insulin aspart  0-5 Units Subcutaneous QHS   insulin aspart protamine- aspart  100 Units Subcutaneous BID WC   loratadine  10 mg Oral Daily   losartan  50 mg Oral Daily   metoprolol succinate  100 mg Oral Daily   mometasone-formoterol  2 puff Inhalation BID   nicotine  14 mg Transdermal Daily   pantoprazole  40 mg Oral Daily   potassium chloride SA  20 mEq Oral Daily   rosuvastatin  5 mg Oral Daily   sodium chloride flush  3 mL Intravenous Q12H   tiZANidine  6 mg Oral TID   umeclidinium bromide  2 puff Inhalation Daily   Continuous Infusions:  sodium chloride     PRN Meds: sodium chloride, albuterol, oxyCODONE, sodium chloride flush   Vital Signs    Vitals:   06/24/20 1205 06/24/20 1943 06/24/20 2032 06/25/20 0410  BP: 139/77  138/79 (!) 179/91  Pulse: 71  81 84  Resp: 20  20 (!) 22  Temp: 98.8 F (37.1 C)  98.3 F (36.8 C) 98.3 F (36.8 C)  TempSrc: Oral  Oral Oral  SpO2: 100% 98% 100% 97%  Weight:    121.6 kg  Height:        Intake/Output Summary (Last 24 hours) at 06/25/2020 0738 Last data filed at 06/25/2020 0200 Gross per 24 hour  Intake 1300 ml  Output 2150 ml  Net -850 ml   Last 3 Weights 06/25/2020 06/24/2020 06/23/2020  Weight (lbs) 268 lb 1.6 oz 273 lb 1.6 oz 274 lb 11.2 oz  Weight (kg) 121.609 kg 123.877 kg 124.603 kg      Telemetry    NSR - Personally Reviewed  ECG    No new tracing this morning.  Physical Exam  Pleasant AAF, sitting up in bed. GEN: No acute distress.   Neck: Difficult  to assess JVD 2/2 to neck girth Cardiac: RRR, no murmurs, rubs, or gallops.  Respiratory: Clear to auscultation bilaterally. GI: Soft, nontender, non-distended  MS: Trace bilateral LE edema; No deformity. Neuro:  Nonfocal  Psych: Normal affect   Labs    High Sensitivity Troponin:   Recent Labs  Lab 06/22/20 0636 06/22/20 0829  TROPONINIHS 103* 86*      Chemistry Recent Labs  Lab 06/23/20 0338 06/24/20 0828 06/25/20 0543  NA 136 138 140  K 4.5 4.0 3.6  CL 100 100 101  CO2 27 28 28   GLUCOSE 257* 191* 139*  BUN 19 23* 21*  CREATININE 1.25* 1.26* 1.27*  CALCIUM 9.0 8.9 8.8*  GFRNONAA 53* 52* 52*  GFRAA >60 >60 60*  ANIONGAP 9 10 11      Hematology Recent Labs  Lab 06/22/20 0636 06/22/20 0636 06/23/20 0702 06/24/20 0828 06/25/20 0543  WBC 12.4*  --  13.9* 14.6*  --   RBC 4.22  --  4.19 4.60  --   HGB 8.3*   < > 8.2* 9.1* 9.2*  HCT 29.3*   < > 28.3* 31.5* 31.9*  MCV 69.4*  --  67.5* 68.5*  --   MCH 19.7*  --  19.6* 19.8*  --   MCHC 28.3*  --  29.0* 28.9*  --   RDW 23.9*  --  23.4* 23.5*  --   PLT 235  --  258 300  --    < > = values in this interval not displayed.    BNP Recent Labs  Lab 06/22/20 0636  BNP 266.3*     DDimer No results for input(s): DDIMER in the last 168 hours.   Radiology    No results found.  Cardiac Studies   N/a  Patient Profile     44 y.o. female with a history of chronic diastolic CHF, labile hypertension with multiple ED visit for hypotension, type 2 diabetes mellitus, COPD with ongoing tobacco abuse, GERD, seizures, sickle cell trait, dysfunction uterine bleeding s/p ablation and morbid obesity with BMI of 47.15 who was seen for the evaluation of CHF at the request of Dr. Ophelia Charter.  Assessment & Plan    1. Acute on Chronic Diastolic CHF: Patient presented with progressive shortness of breath. Likely multifactorial. BNP was mildly elevated at 266. - Echo in 04/2020 showed LVEF of 55-60% with grade 1 diastolic dysfunction.   - Started on IV Lasix 40mg  twice daily which was increased to 80mg  BID. Net - 2.2L. Cr is stable. Plan to transition to oral lasix today.  - Stressed the need for daily weights and low salt diet. - Continue Losartan 50mg  daily and Toprol-XL 100mg  daily.   2. Demand Ischemia: High-sensitivity troponin minimally elevated at 103 >> 86. Not consistent with ACS. No chest pain this morning.  - She does have multiple CV risk factors including HTN, HLD, poorly controlled, DM, tobacco abuse, and family history. May benefit from ischemic evaluation as an outpatient.    3. Syncope - Patient reports several episodes of syncope in the past. Suspect vasovagal. No arrhythmias noted on telemetry.    4. Hypertension: stable. - Continue Losartan and Toprol.   5. Hyperlipidemia - Recent lipid panel from 06/02/2020: Total Cholesterol 135, Triglycerides 166, HDL 29, LDL 77.  - Continue home Crestor 5mg  daily.   6. Type 2 Diabetes Mellitus  - Recent Hemoglobin A1c 9.0%. - Management per primary team.   7. Morbid Obesity - BMI 47.5. - Needs aggressive lifestyle modifications. She would benefit from [facility] to [facility].   8. Tobacco Abuse - She has a 21-22 year smoking history and has smoked 1/2 pack per day for the last 7-8 years.  - Cessation advised  9. Suspected OSA: reports she was planned for a sleep study in the past but did not complete. Will plan to refer as an outpatient as I suspect this is contributing to her dyspnea.  For questions or updates, please contact [facility] Please consult [email or web address] for contact info under        Signed, , NP  06/25/2020, 7:38 AM    Agree with note by NP-C  [name] is well-known to me from the outpatient setting.  She was admitted with diastolic heart failure probably related to medication and dietary indiscretion.  She does continue to smoke.  She has been diuresed over 2 L.  Renal  function is remained stable.  She feels clinically improved.  Her peripheral edema is minimal this morning and she is clear lungs.  She stable for discharge home on slightly increased outpatient diuretic dose.  She will  see an APP back in 7 days and me back in 6 to 8 weeks.  Runell Gess, M.D., FACP, [facility], [facility] [address] [place], [place]  [address]  [phone] 06/25/2020 10:10 AM

## 2020-06-25 NOTE — Progress Notes (Signed)
Patient asked for a snack because she says she felt like her cbg was dropping. RN check cbg-53. Gave glucose gel as well. CBG 71. Will continue to monitor.

## 2020-06-28 ENCOUNTER — Encounter: Payer: Self-pay | Admitting: Family Medicine

## 2020-06-28 ENCOUNTER — Telehealth: Payer: Self-pay | Admitting: Nurse Practitioner

## 2020-06-28 ENCOUNTER — Ambulatory Visit (INDEPENDENT_AMBULATORY_CARE_PROVIDER_SITE_OTHER): Payer: Self-pay | Admitting: Neurology

## 2020-06-28 ENCOUNTER — Encounter: Payer: Self-pay | Admitting: Neurology

## 2020-06-28 ENCOUNTER — Other Ambulatory Visit: Payer: Self-pay

## 2020-06-28 VITALS — BP 175/90 | HR 117 | Ht 64.0 in | Wt 261.0 lb

## 2020-06-28 DIAGNOSIS — Z794 Long term (current) use of insulin: Secondary | ICD-10-CM

## 2020-06-28 DIAGNOSIS — E1140 Type 2 diabetes mellitus with diabetic neuropathy, unspecified: Secondary | ICD-10-CM

## 2020-06-28 MED ORDER — DULOXETINE HCL 60 MG PO CPEP: 60.0000 mg | ORAL_CAPSULE | Freq: Every day | ORAL | 0 refills | Status: DC

## 2020-06-28 MED FILL — DULoxetine HCL 60 MG CPEP: 60 | 30 days supply | Qty: 30 | Fill #0

## 2020-06-28 NOTE — Patient Instructions (Signed)
Recommend that you follow-up with pain management Check feet daily

## 2020-06-28 NOTE — Telephone Encounter (Signed)
This was filled on 06/25/20

## 2020-06-28 NOTE — Progress Notes (Signed)
[facility] Clinic Note - Initial Visit   Date: 06/28/20  Jill Shaw MRN: [id number] DOB: 26-Aug-1976   Dear Dionisio David, NP:  Thank you for your kind referral of Jill Shaw for consultation of neuropathy. Although her history is well known to you, please allow [place] to reiterate it for the purpose of our medical record. The patient was accompanied to the clinic by self.  History of Present Illness: Jill Shaw is a 44 y.o. right-handed female with poorly controlled diabetes mellitus, hypertension, COPD, tobacco use, GERD, and arthritis presenting for evaluation of diabetic neuropathy.   Starting around 2014, she began having numbness/tingling in the soles of feet which has extended into her lower legs, fingers, and arms.  Symptoms are constant.  It is worse with movement, walking, and standing.  NCS/EMG was consistent with sensorimotor polyneuropathy affecting the hands and legs. She has tried gabapentin and Lyrica without improvement.  She currently takes Cymbalta $RemoveBefore'40mg'OyjMIYXutQTgI$  daily with no significant improvement.  Pain often prevents her from sleeping.  She also complaints of achy/throbbing pain in the legs and joints.  She has had her right leg buckle because of pain in the leg.  She has some weakness in the hands and legs. She walks unassisted. She is established with Pain Management, but unable to schedule a visit because her medicaid is pending.   She has seizure in the setting of hypoglycemia, last seizure was about a year ago. .   Out-side paper records, electronic medical record, and images have been reviewed where available and summarized as:  NCS/EMG of the right arm and leg 07/22/2019: 1. 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 2. A superimposed right carpal tunnel syndrome (mild) is also likely, correlate clinically.  Lab Results  Component Value Date    HGBA1C 9.0 (A) 06/02/2020   HGBA1C 9.0 06/02/2020   HGBA1C 9.0 (A) 06/02/2020   HGBA1C 9.0 (A) 06/02/2020   Lab Results  Component Value Date   [id number] 761 11/05/2019   Lab Results  Component Value Date   TSH 3.100 11/05/2019   Lab Results  Component Value Date   ESRSEDRATE 98 (H) 11/24/2019    Past Medical History:  Diagnosis Date  . Anemia   . Arthritis    knees, hands  . Asthma   . Chronic diastolic (congestive) heart failure ([place])   . COPD (chronic obstructive pulmonary disease) ([place])   . Diabetes mellitus without complication ([place])    type 2  . Dysfunctional uterine bleeding   . GERD (gastroesophageal reflux disease)   . Hypertension   . Neuromuscular disorder ([facility])    neuropathy feet  . Seizures ([place]) 09/12/2017   pt states r/t stress and blood sugar - no meds last one 4 months ago, not seen neurologist  . Sickle cell trait ([place])   . Smoker   . Vitamin D deficiency 10/2019  . Wears glasses     Past Surgical History:  Procedure Laterality Date  . CESAREAN SECTION     x 1. for twins  . DILATION AND CURETTAGE OF UTERUS N/A 08/20/2019   Procedure: DILATATION AND CURETTAGE;  Surgeon: Emily Filbert, MD;  Location: [facility];  Service: Gynecology;  Laterality: N/A;  . ENDOMETRIAL ABLATION N/A 08/20/2019   Procedure: [place] Ablation;  Surgeon: Emily Filbert, MD;  Location: [facility];  Service: Gynecology;  Laterality: N/A;  . EYE SURGERY Bilateral    laser  right and cataract removed left eye  . RADIOLOGY WITH ANESTHESIA N/A 09/16/2019   Procedure: MRI WITH ANESTHESIA   L SPINE WITHOUT CONTRAST, T SPINE WITHOUT CONTRAST , CERVICAL WITHOUT CONTRAST;  Surgeon: Radiologist, Medication, MD;  Location: [facility];  Service: Radiology;  Laterality: N/A;  . TUBAL LIGATION     interval BTL  . UPPER GI ENDOSCOPY  07/2017     Medications:  Outpatient Encounter Medications as of 06/28/2020  Medication Sig Note  . albuterol (PROVENTIL) (2.5 MG/3ML) 0.083% nebulizer solution Take 3  mLs (2.5 mg total) by nebulization every 6 (six) hours as needed for wheezing or shortness of breath.   Marland Kitchen albuterol (VENTOLIN HFA) 108 (90 Base) MCG/ACT inhaler Inhale 2 puffs into the lungs every 6 (six) hours as needed for wheezing or shortness of breath.   . blood glucose meter kit and supplies KIT Dispense based on patient and insurance preference. Use up to four times daily as directed. (FOR ICD-9 250.00, 250.01). (Patient taking differently: 1 each by Other route See admin instructions. Dispense based on patient and insurance preference. Use up to four times daily as directed. (FOR ICD-9 250.00, 250.01).)   . Blood Glucose Monitoring Suppl (TRUE METRIX METER) w/Device KIT 1 each by Does not apply route 4 (four) times daily -  before meals and at bedtime.   . Blood Pressure Monitoring (BLOOD PRESSURE KIT) DEVI Check BP BID prn (Patient taking differently: 1 each by Other route 2 (two) times daily as needed (blood pressure). )   . budesonide-formoterol (SYMBICORT) 160-4.5 MCG/ACT inhaler INHALE 2 PUFFS INTO THE LUNGS 2 (TWO) TIMES DAILY. (Patient taking differently: Inhale 2 puffs into the lungs 2 (two) times daily. )   . cetirizine (ZYRTEC) 10 MG tablet Take 1 tablet (10 mg total) by mouth daily.   . Cholecalciferol (VITAMIN D-3) 125 MCG (5000 UT) TABS Take 1 tablet by mouth daily. (Patient taking differently: Take 5,000 Units by mouth daily. )   . diclofenac (VOLTAREN) 75 MG EC tablet Take 1 tablet (75 mg total) by mouth 2 (two) times daily as needed. (Patient taking differently: Take 75 mg by mouth 2 (two) times daily as needed for mild pain. )   . DULoxetine 40 MG CPEP Take 40 mg by mouth daily.   . ferrous sulfate 325 (65 FE) MG tablet Take 1 tablet (325 mg total) by mouth 3 (three) times daily with meals. (Patient taking differently: Take 325 mg by mouth 2 (two) times daily with a meal. )   . fluticasone (FLONASE) 50 MCG/ACT nasal spray Place 2 sprays into both nostrils daily.   . furosemide  (LASIX) 40 MG tablet Take 1 tablet (40 mg total) by mouth 2 (two) times daily.   . Glucosamine Sulfate 1000 MG CAPS Take 1 capsule (1,000 mg total) by mouth 2 (two) times daily.   Marland Kitchen glucose blood (TRUE METRIX BLOOD GLUCOSE TEST) test strip Use as instructed   . hydrocortisone 2.5 % cream Apply topically 2 (two) times daily.   . hydroquinone 4 % cream Apply topically 2 (two) times daily.   . Insulin Lispro Prot & Lispro (HUMALOG MIX 75/25 KWIKPEN) (75-25) 100 UNIT/ML Kwikpen INJECT 100 UNITS EVERY 12 HOURS (Patient taking differently: Inject 100 Units into the skin every 12 (twelve) hours. )   . Insulin Pen Needle (PEN NEEDLES) 30G X 5 MM MISC 1 Units by Does not apply route as directed.   Marland Kitchen losartan (COZAAR) 50 MG tablet Take 1 tablet (50 mg total)  by mouth daily.   . metoprolol succinate (TOPROL-XL) 100 MG 24 hr tablet Take 1 tablet (100 mg total) by mouth daily. Take with or immediately following a meal.   . norethindrone (AYGESTIN) 5 MG tablet Take 2 tablets (10 mg total) by mouth in the morning, at noon, in the evening, and at bedtime.   Marland Kitchen omeprazole (PRILOSEC) 40 MG capsule Take 1 capsule (40 mg total) by mouth daily.   Marland Kitchen oxyCODONE (ROXICODONE) 5 MG immediate release tablet Take 1-2 tablets (5-10 mg total) by mouth every 6 (six) hours as needed for severe pain. 06/22/2020: LF @ [facility] on 05-07-20 DS 7 # 28  . potassium chloride SA (KLOR-CON) 20 MEQ tablet Take 1 tablet (20 mEq total) by mouth daily.   . promethazine (PHENERGAN) 12.5 MG tablet Take 1 tablet (12.5 mg total) by mouth every 6 (six) hours as needed for nausea or vomiting.   . rosuvastatin (CRESTOR) 5 MG tablet Take 1 tablet (5 mg total) by mouth daily.   . saxagliptin HCl (ONGLYZA) 2.5 MG TABS tablet Take 1 tablet (2.5 mg total) by mouth daily.   . Tiotropium Bromide Monohydrate (SPIRIVA RESPIMAT) 2.5 MCG/ACT AERS Inhale 2 puffs into the lungs daily.   Marland Kitchen tiZANidine (ZANAFLEX) 2 MG tablet Take 3 tablets (6 mg total) by mouth every 8  (eight) hours as needed for muscle spasms.   . tranexamic acid (LYSTEDA) 650 MG TABS tablet Take 2 tablets (1,300 mg total) by mouth 3 (three) times daily. Take during menses for a maximum of five days   . TRUEplus Lancets 28G MISC 1 each by Other route in the morning, at noon, in the evening, and at bedtime.   . Turmeric 500 MG CAPS Take 500 mg by mouth 2 (two) times daily.   . Vitamin D, Ergocalciferol, (DRISDOL) 1.25 MG (50000 UNIT) CAPS capsule Take 1 capsule (50,000 Units total) by mouth every 7 (seven) days. (Patient taking differently: Take 50,000 Units by mouth every Friday. )   . promethazine (PHENERGAN) 25 MG tablet Take 1 tablet (25 mg total) by mouth every 6 (six) hours as needed for nausea or vomiting. (Patient not taking: Reported on 06/28/2020)   . [DISCONTINUED] hydrALAZINE (APRESOLINE) 50 MG tablet Take 1 tablet (50 mg total) by mouth 3 (three) times daily.    No facility-administered encounter medications on file as of 06/28/2020.    Allergies:  Allergies  Allergen Reactions  . Ketoprofen Nausea And Vomiting  . Aspirin Nausea Only  . Gabapentin Nausea And Vomiting    upset stomach  . Ibuprofen Nausea And Vomiting  . Liraglutide Nausea And Vomiting  . Naproxen Nausea And Vomiting  . Omeprazole-Sodium Bicarbonate Nausea And Vomiting  . Sulfa Antibiotics Nausea And Vomiting  . Tramadol Nausea And Vomiting    stomach upset    Family History: Family History  Problem Relation Age of Onset  . Diabetes Mother   . Hypertension Mother     Social History: Social History   Tobacco Use  . Smoking status: Current Every Day Smoker    Packs/day: 0.50    Years: 26.00    Pack years: 13.00    Types: Cigarettes  . Smokeless tobacco: Never Used  . Tobacco comment: 0.5 pack/day, desires patch  Vaping Use  . Vaping Use: Never used  Substance Use Topics  . Alcohol use: No  . Drug use: No   Social History   Social History Narrative   Right Handed   Lives in a one story  apartment, but lives on the second floor   Drinks caffeine once in awhile    Vital Signs:  BP (!) 175/90   Pulse (!) 117   Ht $R'5\' 4"'cU$  (1.626 m)   Wt 261 lb (118.4 kg)   SpO2 99%   BMI 44.80 kg/m   Neurological Exam: MENTAL STATUS including orientation to time, place, person, recent and remote memory, attention span and concentration, language, and fund of knowledge is normal.  Speech is not dysarthric.  CRANIAL NERVES: II:  No visual field defects.     III-IV-VI: Pupils equal round and reactive . Normal conjugate, extra-ocular eye movements in all directions of gaze.  No nystagmus.  No ptosis.   VII:  Normal facial symmetry and movements.   VIII:  Normal hearing and vestibular function.   IX-X:  Normal palatal movement.   XI:  Normal shoulder shrug and head rotation.   XII:  Normal tongue strength and range of motion, no deviation or fasciculation.  MOTOR:  No atrophy, fasciculations or abnormal movements.  No pronator drift.   Upper Extremity:  Right  Left  Deltoid  5/5   5/5   Biceps  5/5   5/5   Triceps  5/5   5/5   Infraspinatus 5/5  5/5  Medial pectoralis 5/5  5/5  Wrist extensors  5/5   5/5   Wrist flexors  5/5   5/5   Finger extensors  5/5   5/5   Finger flexors  5/5   5/5   Dorsal interossei  5/5   5/5   Abductor pollicis  5/5   5/5   Tone (Ashworth scale)  0  0   Lower Extremity:  Right  Left  Hip flexors  5/5   5/5   Hip extensors  5/5   5/5   Adductor 5/5  5/5  Abductor 5/5  5/5  Knee flexors  5/5   5/5   Knee extensors  5/5   5/5   Dorsiflexors  5/5   5/5   Plantarflexors  5/5   5/5   Toe extensors  5/5   5/5   Toe flexors  5/5   5/5   Tone (Ashworth scale)  0  0   MSRs:  Right        Left                  brachioradialis 2+  2+  biceps 2+  2+  triceps 2+  2+  patellar 2+  2+  ankle jerk 0  0  Hoffman no  no  plantar response down  down   SENSORY:  Vibration is reduced at the great toe bilaterally, temperature and pin prick is reduced in the  feet bilaterally.  Romberg's sign absent.   COORDINATION/GAIT: Normal finger-to- nose-finger.  Intact rapid alternating movements bilaterally. Gait narrow based and stable. Tandem and stressed gait intact.    IMPRESSION: Diabetic painful neuropathy affecting the hands and feet.  Long standing history of diabetes which is poorly controlled  - Previously tried:  Gabapentin, Lyrica, unable to use TCA due to QTC borderline prolonged 434ms  - Increase Cymbalta to $RemoveBef'60mg'ZvkgAQnwhn$  daily  - Follow-up with pain management  - Educated on keeping sugars well-controlled  - Patient educated on daily foot inspection, fall prevention, and safety precautions around the home.  Total time spent reviewing records, interview, history/exam, documentation, counseling, and coordination of care on day of encounter:  40 min   Thank you for allowing  me to participate in patient's care.  If I can answer any additional questions, I would be pleased to do so.    Sincerely,    Kaisen Ackers K. Posey Pronto, DO

## 2020-06-29 MED ORDER — BACLOFEN 10 MG PO TABS: 5.0000 mg | ORAL_TABLET | Freq: Three times a day (TID) | ORAL | 3 refills | Status: DC | PRN

## 2020-06-29 MED FILL — BACLOFEN 10 MG TABLET: 10 | 10 days supply | Qty: 30 | Fill #0

## 2020-06-30 ENCOUNTER — Ambulatory Visit: Payer: Medicaid Other | Admitting: Physical Therapy

## 2020-06-30 ENCOUNTER — Other Ambulatory Visit: Payer: Self-pay | Admitting: Nurse Practitioner

## 2020-07-01 ENCOUNTER — Telehealth: Payer: Self-pay

## 2020-07-01 DIAGNOSIS — R4182 Altered mental status, unspecified: Secondary | ICD-10-CM | POA: Diagnosis not present

## 2020-07-01 DIAGNOSIS — R000 Tachycardia, unspecified: Secondary | ICD-10-CM | POA: Diagnosis not present

## 2020-07-01 DIAGNOSIS — R456 Violent behavior: Secondary | ICD-10-CM | POA: Diagnosis not present

## 2020-07-01 DIAGNOSIS — R404 Transient alteration of awareness: Secondary | ICD-10-CM | POA: Diagnosis not present

## 2020-07-01 DIAGNOSIS — E1165 Type 2 diabetes mellitus with hyperglycemia: Secondary | ICD-10-CM | POA: Diagnosis not present

## 2020-07-01 NOTE — Telephone Encounter (Signed)
Received surgery paperwork from Dr. Logan Bores. I have left 2 messages for Jill Shaw to call so we can get surgery scheduled. She was originally to surgery will be at [facility] but due to having no insurance the surgery will need to be done at the hospital.

## 2020-07-02 ENCOUNTER — Emergency Department (HOSPITAL_COMMUNITY): Payer: Medicaid Other

## 2020-07-02 ENCOUNTER — Ambulatory Visit: Payer: Self-pay | Admitting: Obstetrics and Gynecology

## 2020-07-02 ENCOUNTER — Inpatient Hospital Stay (HOSPITAL_COMMUNITY): Payer: Medicaid Other

## 2020-07-02 ENCOUNTER — Inpatient Hospital Stay (HOSPITAL_COMMUNITY)
Admission: EM | Admit: 2020-07-02 | Discharge: 2020-07-04 | DRG: 637 | Disposition: A | Discharge status: Home / Self Care | Payer: Medicaid Other | Attending: Internal Medicine | Admitting: Internal Medicine

## 2020-07-02 DIAGNOSIS — Z794 Long term (current) use of insulin: Secondary | ICD-10-CM

## 2020-07-02 DIAGNOSIS — Z882 Allergy status to sulfonamides status: Secondary | ICD-10-CM

## 2020-07-02 DIAGNOSIS — I5032 Chronic diastolic (congestive) heart failure: Secondary | ICD-10-CM | POA: Diagnosis present

## 2020-07-02 DIAGNOSIS — K2900 Acute gastritis without bleeding: Secondary | ICD-10-CM | POA: Diagnosis present

## 2020-07-02 DIAGNOSIS — G894 Chronic pain syndrome: Secondary | ICD-10-CM | POA: Diagnosis present

## 2020-07-02 DIAGNOSIS — G9341 Metabolic encephalopathy: Secondary | ICD-10-CM | POA: Diagnosis present

## 2020-07-02 DIAGNOSIS — G8929 Other chronic pain: Secondary | ICD-10-CM | POA: Diagnosis present

## 2020-07-02 DIAGNOSIS — Z6841 Body Mass Index (BMI) 40.0 and over, adult: Secondary | ICD-10-CM | POA: Diagnosis not present

## 2020-07-02 DIAGNOSIS — I130 Hypertensive heart and chronic kidney disease with heart failure and stage 1 through stage 4 chronic kidney disease, or unspecified chronic kidney disease: Secondary | ICD-10-CM | POA: Diagnosis present

## 2020-07-02 DIAGNOSIS — Z9842 Cataract extraction status, left eye: Secondary | ICD-10-CM

## 2020-07-02 DIAGNOSIS — E1110 Type 2 diabetes mellitus with ketoacidosis without coma: Secondary | ICD-10-CM | POA: Diagnosis present

## 2020-07-02 DIAGNOSIS — Z20822 Contact with and (suspected) exposure to covid-19: Secondary | ICD-10-CM | POA: Diagnosis present

## 2020-07-02 DIAGNOSIS — Z23 Encounter for immunization: Secondary | ICD-10-CM | POA: Diagnosis not present

## 2020-07-02 DIAGNOSIS — K219 Gastro-esophageal reflux disease without esophagitis: Secondary | ICD-10-CM | POA: Diagnosis present

## 2020-07-02 DIAGNOSIS — R000 Tachycardia, unspecified: Secondary | ICD-10-CM | POA: Diagnosis present

## 2020-07-02 DIAGNOSIS — D72829 Elevated white blood cell count, unspecified: Secondary | ICD-10-CM | POA: Diagnosis present

## 2020-07-02 DIAGNOSIS — N179 Acute kidney failure, unspecified: Secondary | ICD-10-CM | POA: Diagnosis present

## 2020-07-02 DIAGNOSIS — K3184 Gastroparesis: Secondary | ICD-10-CM | POA: Diagnosis present

## 2020-07-02 DIAGNOSIS — Z885 Allergy status to narcotic agent status: Secondary | ICD-10-CM

## 2020-07-02 DIAGNOSIS — E1122 Type 2 diabetes mellitus with diabetic chronic kidney disease: Secondary | ICD-10-CM | POA: Diagnosis present

## 2020-07-02 DIAGNOSIS — F17210 Nicotine dependence, cigarettes, uncomplicated: Secondary | ICD-10-CM | POA: Diagnosis present

## 2020-07-02 DIAGNOSIS — Z7951 Long term (current) use of inhaled steroids: Secondary | ICD-10-CM

## 2020-07-02 DIAGNOSIS — E1142 Type 2 diabetes mellitus with diabetic polyneuropathy: Secondary | ICD-10-CM | POA: Diagnosis present

## 2020-07-02 DIAGNOSIS — Z833 Family history of diabetes mellitus: Secondary | ICD-10-CM

## 2020-07-02 DIAGNOSIS — E861 Hypovolemia: Secondary | ICD-10-CM | POA: Diagnosis present

## 2020-07-02 DIAGNOSIS — Z886 Allergy status to analgesic agent status: Secondary | ICD-10-CM | POA: Diagnosis not present

## 2020-07-02 DIAGNOSIS — N920 Excessive and frequent menstruation with regular cycle: Secondary | ICD-10-CM | POA: Diagnosis present

## 2020-07-02 DIAGNOSIS — D509 Iron deficiency anemia, unspecified: Secondary | ICD-10-CM | POA: Diagnosis present

## 2020-07-02 DIAGNOSIS — J449 Chronic obstructive pulmonary disease, unspecified: Secondary | ICD-10-CM | POA: Diagnosis present

## 2020-07-02 DIAGNOSIS — I10 Essential (primary) hypertension: Secondary | ICD-10-CM | POA: Diagnosis present

## 2020-07-02 DIAGNOSIS — E1143 Type 2 diabetes mellitus with diabetic autonomic (poly)neuropathy: Secondary | ICD-10-CM | POA: Diagnosis present

## 2020-07-02 DIAGNOSIS — Z79899 Other long term (current) drug therapy: Secondary | ICD-10-CM

## 2020-07-02 DIAGNOSIS — E872 Acidosis, unspecified: Secondary | ICD-10-CM | POA: Diagnosis present

## 2020-07-02 DIAGNOSIS — E1111 Type 2 diabetes mellitus with ketoacidosis with coma: Secondary | ICD-10-CM

## 2020-07-02 DIAGNOSIS — R079 Chest pain, unspecified: Secondary | ICD-10-CM

## 2020-07-02 DIAGNOSIS — Z888 Allergy status to other drugs, medicaments and biological substances status: Secondary | ICD-10-CM

## 2020-07-02 DIAGNOSIS — E559 Vitamin D deficiency, unspecified: Secondary | ICD-10-CM | POA: Diagnosis present

## 2020-07-02 DIAGNOSIS — Z8249 Family history of ischemic heart disease and other diseases of the circulatory system: Secondary | ICD-10-CM

## 2020-07-02 DIAGNOSIS — D573 Sickle-cell trait: Secondary | ICD-10-CM | POA: Diagnosis present

## 2020-07-02 DIAGNOSIS — E1310 Other specified diabetes mellitus with ketoacidosis without coma: Secondary | ICD-10-CM

## 2020-07-02 DIAGNOSIS — G9340 Encephalopathy, unspecified: Secondary | ICD-10-CM | POA: Diagnosis present

## 2020-07-02 DIAGNOSIS — E119 Type 2 diabetes mellitus without complications: Secondary | ICD-10-CM

## 2020-07-02 DIAGNOSIS — N1831 Chronic kidney disease, stage 3a: Secondary | ICD-10-CM | POA: Diagnosis present

## 2020-07-02 LAB — CBC WITH DIFFERENTIAL/PLATELET
Abs Immature Granulocytes: 0.08 10*3/uL — ABNORMAL HIGH (ref 0.00–0.07)
Basophils Absolute: 0.1 10*3/uL (ref 0.0–0.1)
Basophils Relative: 0 %
Eosinophils Absolute: 0 10*3/uL (ref 0.0–0.5)
Eosinophils Relative: 0 %
HCT: 33.9 % — ABNORMAL LOW (ref 36.0–46.0)
Hemoglobin: 9.7 g/dL — ABNORMAL LOW (ref 12.0–15.0)
Immature Granulocytes: 0 %
Lymphocytes Relative: 10 %
Lymphs Abs: 2 10*3/uL (ref 0.7–4.0)
MCH: 19.8 pg — ABNORMAL LOW (ref 26.0–34.0)
MCHC: 28.6 g/dL — ABNORMAL LOW (ref 30.0–36.0)
MCV: 69.2 fL — ABNORMAL LOW (ref 80.0–100.0)
Monocytes Absolute: 0.8 10*3/uL (ref 0.1–1.0)
Monocytes Relative: 4 %
Neutro Abs: 17.6 10*3/uL — ABNORMAL HIGH (ref 1.7–7.7)
Neutrophils Relative %: 86 %
Platelets: 311 10*3/uL (ref 150–400)
RBC: 4.9 MIL/uL (ref 3.87–5.11)
RDW: 24.1 % — ABNORMAL HIGH (ref 11.5–15.5)
WBC: 20.5 10*3/uL — ABNORMAL HIGH (ref 4.0–10.5)
nRBC: 0 % (ref 0.0–0.2)

## 2020-07-02 LAB — COMPREHENSIVE METABOLIC PANEL
ALT: 17 U/L (ref 0–44)
AST: 23 U/L (ref 15–41)
Albumin: 4 g/dL (ref 3.5–5.0)
Alkaline Phosphatase: 78 U/L (ref 38–126)
Anion gap: 22 — ABNORMAL HIGH (ref 5–15)
BUN: 22 mg/dL — ABNORMAL HIGH (ref 6–20)
CO2: 19 mmol/L — ABNORMAL LOW (ref 22–32)
Calcium: 10 mg/dL (ref 8.9–10.3)
Chloride: 91 mmol/L — ABNORMAL LOW (ref 98–111)
Creatinine, Ser: 1.87 mg/dL — ABNORMAL HIGH (ref 0.44–1.00)
GFR calc Af Amer: 37 mL/min — ABNORMAL LOW (ref >60)
GFR calc non Af Amer: 32 mL/min — ABNORMAL LOW (ref >60)
Glucose, Bld: 958 mg/dL (ref 70–99)
Potassium: 5.3 mmol/L — ABNORMAL HIGH (ref 3.5–5.1)
Sodium: 132 mmol/L — ABNORMAL LOW (ref 135–145)
Total Bilirubin: 1.7 mg/dL — ABNORMAL HIGH (ref 0.3–1.2)
Total Protein: 7.9 g/dL (ref 6.5–8.1)

## 2020-07-02 LAB — AMMONIA: Ammonia: 37 umol/L — ABNORMAL HIGH (ref 9–35)

## 2020-07-02 LAB — I-STAT VENOUS BLOOD GAS, ED
Acid-Base Excess: 1 mmol/L (ref 0.0–2.0)
Bicarbonate: 23.8 mmol/L (ref 20.0–28.0)
Calcium, Ion: 1.2 mmol/L (ref 1.15–1.40)
HCT: 30 % — ABNORMAL LOW (ref 36.0–46.0)
Hemoglobin: 10.2 g/dL — ABNORMAL LOW (ref 12.0–15.0)
O2 Saturation: 92 %
Potassium: 4.2 mmol/L (ref 3.5–5.1)
Sodium: 139 mmol/L (ref 135–145)
TCO2: 25 mmol/L (ref 22–32)
pCO2, Ven: 31.6 mmHg — ABNORMAL LOW (ref 44.0–60.0)
pH, Ven: 7.486 — ABNORMAL HIGH (ref 7.250–7.430)
pO2, Ven: 59 mmHg — ABNORMAL HIGH (ref 32.0–45.0)

## 2020-07-02 LAB — I-STAT BETA HCG BLOOD, ED (MC, WL, AP ONLY): I-stat hCG, quantitative: <5 m[IU]/mL (ref <5)

## 2020-07-02 LAB — BASIC METABOLIC PANEL
Anion gap: 15 (ref 5–15)
Anion gap: 13 (ref 5–15)
Anion gap: 15 (ref 5–15)
BUN: 19 mg/dL (ref 6–20)
BUN: 21 mg/dL — ABNORMAL HIGH (ref 6–20)
BUN: 20 mg/dL (ref 6–20)
CO2: 22 mmol/L (ref 22–32)
CO2: 24 mmol/L (ref 22–32)
CO2: 25 mmol/L (ref 22–32)
Calcium: 9.5 mg/dL (ref 8.9–10.3)
Calcium: 9.7 mg/dL (ref 8.9–10.3)
Calcium: 9.6 mg/dL (ref 8.9–10.3)
Chloride: 100 mmol/L (ref 98–111)
Chloride: 102 mmol/L (ref 98–111)
Chloride: 101 mmol/L (ref 98–111)
Creatinine, Ser: 1.66 mg/dL — ABNORMAL HIGH (ref 0.44–1.00)
Creatinine, Ser: 1.77 mg/dL — ABNORMAL HIGH (ref 0.44–1.00)
Creatinine, Ser: 1.51 mg/dL — ABNORMAL HIGH (ref 0.44–1.00)
GFR calc Af Amer: 43 mL/min — ABNORMAL LOW (ref >60)
GFR calc Af Amer: 40 mL/min — ABNORMAL LOW (ref >60)
GFR calc Af Amer: 49 mL/min — ABNORMAL LOW (ref >60)
GFR calc non Af Amer: 37 mL/min — ABNORMAL LOW (ref >60)
GFR calc non Af Amer: 35 mL/min — ABNORMAL LOW (ref >60)
GFR calc non Af Amer: 42 mL/min — ABNORMAL LOW (ref >60)
Glucose, Bld: 430 mg/dL — ABNORMAL HIGH (ref 70–99)
Glucose, Bld: 335 mg/dL — ABNORMAL HIGH (ref 70–99)
Glucose, Bld: 221 mg/dL — ABNORMAL HIGH (ref 70–99)
Potassium: 4.1 mmol/L (ref 3.5–5.1)
Potassium: 4.3 mmol/L (ref 3.5–5.1)
Potassium: 4 mmol/L (ref 3.5–5.1)
Sodium: 137 mmol/L (ref 135–145)
Sodium: 139 mmol/L (ref 135–145)
Sodium: 141 mmol/L (ref 135–145)

## 2020-07-02 LAB — RAPID URINE DRUG SCREEN, HOSP PERFORMED
Amphetamines: NOT DETECTED
Barbiturates: NOT DETECTED
Benzodiazepines: NOT DETECTED
Cocaine: NOT DETECTED
Opiates: NOT DETECTED
Tetrahydrocannabinol: NOT DETECTED

## 2020-07-02 LAB — URINALYSIS, ROUTINE W REFLEX MICROSCOPIC
Bilirubin Urine: NEGATIVE
Glucose, UA: >=500 mg/dL — AB
Ketones, ur: 5 mg/dL — AB
Leukocytes,Ua: NEGATIVE
Nitrite: NEGATIVE
Protein, ur: NEGATIVE mg/dL
RBC / HPF: >50 RBC/hpf — ABNORMAL HIGH (ref 0–5)
Specific Gravity, Urine: 1.024 (ref 1.005–1.030)
pH: 6 (ref 5.0–8.0)

## 2020-07-02 LAB — GLUCOSE, CAPILLARY
Glucose-Capillary: 308 mg/dL — ABNORMAL HIGH (ref 70–99)
Glucose-Capillary: 234 mg/dL — ABNORMAL HIGH (ref 70–99)
Glucose-Capillary: 250 mg/dL — ABNORMAL HIGH (ref 70–99)
Glucose-Capillary: 195 mg/dL — ABNORMAL HIGH (ref 70–99)
Glucose-Capillary: 248 mg/dL — ABNORMAL HIGH (ref 70–99)

## 2020-07-02 LAB — CBG MONITORING, ED
Glucose-Capillary: 202 mg/dL — ABNORMAL HIGH (ref 70–99)
Glucose-Capillary: 216 mg/dL — ABNORMAL HIGH (ref 70–99)
Glucose-Capillary: 253 mg/dL — ABNORMAL HIGH (ref 70–99)
Glucose-Capillary: 264 mg/dL — ABNORMAL HIGH (ref 70–99)
Glucose-Capillary: 300 mg/dL — ABNORMAL HIGH (ref 70–99)
Glucose-Capillary: 319 mg/dL — ABNORMAL HIGH (ref 70–99)
Glucose-Capillary: 348 mg/dL — ABNORMAL HIGH (ref 70–99)
Glucose-Capillary: 425 mg/dL — ABNORMAL HIGH (ref 70–99)
Glucose-Capillary: >600 mg/dL (ref 70–99)
Glucose-Capillary: >600 mg/dL (ref 70–99)
Glucose-Capillary: >600 mg/dL (ref 70–99)
Glucose-Capillary: >600 mg/dL (ref 70–99)

## 2020-07-02 LAB — LACTIC ACID, PLASMA
Lactic Acid, Venous: 4.3 mmol/L (ref 0.5–1.9)
Lactic Acid, Venous: 3.6 mmol/L (ref 0.5–1.9)
Lactic Acid, Venous: 2.4 mmol/L (ref 0.5–1.9)

## 2020-07-02 LAB — SARS CORONAVIRUS 2 BY RT PCR (HOSPITAL ORDER, PERFORMED IN ~~LOC~~ HOSPITAL LAB): SARS Coronavirus 2: NEGATIVE

## 2020-07-02 LAB — BETA-HYDROXYBUTYRIC ACID: Beta-Hydroxybutyric Acid: 3.87 mmol/L — ABNORMAL HIGH (ref 0.05–0.27)

## 2020-07-02 LAB — LIPASE, BLOOD: Lipase: 29 U/L (ref 11–51)

## 2020-07-02 LAB — ETHANOL: Alcohol, Ethyl (B): <10 mg/dL (ref <10)

## 2020-07-02 MED ORDER — METOPROLOL TARTRATE 5 MG/5ML IV SOLN: 2.5000 mg | Freq: Four times a day (QID) | INTRAVENOUS | Status: DC | PRN

## 2020-07-02 MED ORDER — DEXTROSE 50 % IV SOLN: 0.0000 mL | INTRAVENOUS | Status: DC | PRN

## 2020-07-02 MED ORDER — SODIUM CHLORIDE 0.9% FLUSH
10.0000 mL | INTRAVENOUS | Status: DC | PRN
  Administered 2020-07-03: 10 mL

## 2020-07-02 MED ORDER — METOPROLOL TARTRATE 5 MG/5ML IV SOLN
5.0000 mg | Freq: Once | INTRAVENOUS | Status: AC
  Administered 2020-07-02: 5 mg via INTRAVENOUS
  Filled 2020-07-02: qty 5

## 2020-07-02 MED ORDER — SODIUM CHLORIDE 0.9 % IV BOLUS
500.0000 mL | Freq: Once | INTRAVENOUS | Status: AC
  Administered 2020-07-02: 500 mL via INTRAVENOUS

## 2020-07-02 MED ORDER — NICOTINE 14 MG/24HR TD PT24
14.0000 mg | MEDICATED_PATCH | Freq: Every day | TRANSDERMAL | Status: DC
  Administered 2020-07-03 – 2020-07-04 (×2): 14 mg via TRANSDERMAL
  Filled 2020-07-02 (×2): qty 1

## 2020-07-02 MED ORDER — ENOXAPARIN SODIUM 40 MG/0.4ML ~~LOC~~ SOLN
40.0000 mg | SUBCUTANEOUS | Status: DC
  Administered 2020-07-02: 40 mg via SUBCUTANEOUS
  Filled 2020-07-02: qty 0.4

## 2020-07-02 MED ORDER — LACTATED RINGERS IV BOLUS
1000.0000 mL | Freq: Once | INTRAVENOUS | Status: AC
  Administered 2020-07-02: 1000 mL via INTRAVENOUS

## 2020-07-02 MED ORDER — ALUM & MAG HYDROXIDE-SIMETH 200-200-20 MG/5ML PO SUSP
30.0000 mL | Freq: Once | ORAL | Status: AC
  Administered 2020-07-02: 30 mL via ORAL
  Filled 2020-07-02: qty 30

## 2020-07-02 MED ORDER — METOCLOPRAMIDE HCL 5 MG/ML IJ SOLN
10.0000 mg | Freq: Three times a day (TID) | INTRAMUSCULAR | Status: DC
  Administered 2020-07-02 – 2020-07-03 (×4): 10 mg via INTRAVENOUS
  Filled 2020-07-02 (×4): qty 2

## 2020-07-02 MED ORDER — INSULIN ASPART 100 UNIT/ML ~~LOC~~ SOLN
0.0000 [IU] | Freq: Every day | SUBCUTANEOUS | Status: DC
  Administered 2020-07-03: 3 [IU] via SUBCUTANEOUS
  Administered 2020-07-03: 4 [IU] via SUBCUTANEOUS

## 2020-07-02 MED ORDER — INSULIN DETEMIR 100 UNIT/ML ~~LOC~~ SOLN
45.0000 [IU] | Freq: Two times a day (BID) | SUBCUTANEOUS | Status: DC
  Administered 2020-07-02: 45 [IU] via SUBCUTANEOUS
  Filled 2020-07-02 (×4): qty 0.45

## 2020-07-02 MED ORDER — DEXTROSE-NACL 5-0.45 % IV SOLN: INTRAVENOUS | Status: AC

## 2020-07-02 MED ORDER — DEXTROSE IN LACTATED RINGERS 5 % IV SOLN: INTRAVENOUS | Status: DC

## 2020-07-02 MED ORDER — INSULIN REGULAR(HUMAN) IN NACL 100-0.9 UT/100ML-% IV SOLN
INTRAVENOUS | Status: AC
  Administered 2020-07-02: 10.5 [IU]/h via INTRAVENOUS
  Filled 2020-07-02 (×2): qty 100

## 2020-07-02 MED ORDER — METOPROLOL TARTRATE 50 MG PO TABS
50.0000 mg | ORAL_TABLET | Freq: Two times a day (BID) | ORAL | Status: DC
  Administered 2020-07-02 – 2020-07-04 (×4): 50 mg via ORAL
  Filled 2020-07-02 (×4): qty 1

## 2020-07-02 MED ORDER — LACTATED RINGERS IV SOLN: INTRAVENOUS | Status: DC

## 2020-07-02 MED ORDER — TRANEXAMIC ACID 650 MG PO TABS
1300.0000 mg | ORAL_TABLET | Freq: Three times a day (TID) | ORAL | Status: DC
  Administered 2020-07-03 – 2020-07-04 (×5): 1300 mg via ORAL
  Filled 2020-07-02 (×7): qty 2

## 2020-07-02 MED ORDER — TIZANIDINE HCL 4 MG PO TABS
6.0000 mg | ORAL_TABLET | Freq: Three times a day (TID) | ORAL | Status: DC | PRN
  Administered 2020-07-02 – 2020-07-04 (×3): 6 mg via ORAL
  Filled 2020-07-02 (×3): qty 2

## 2020-07-02 MED ORDER — METOCLOPRAMIDE HCL 5 MG/ML IJ SOLN
5.0000 mg | Freq: Once | INTRAMUSCULAR | Status: AC
  Administered 2020-07-02: 5 mg via INTRAVENOUS
  Filled 2020-07-02: qty 2

## 2020-07-02 MED ORDER — PANTOPRAZOLE SODIUM 40 MG IV SOLR
40.0000 mg | Freq: Every day | INTRAVENOUS | Status: DC
  Administered 2020-07-02: 40 mg via INTRAVENOUS
  Filled 2020-07-02: qty 40

## 2020-07-02 MED ORDER — INSULIN ASPART 100 UNIT/ML ~~LOC~~ SOLN
0.0000 [IU] | Freq: Three times a day (TID) | SUBCUTANEOUS | Status: DC
  Administered 2020-07-02: 7 [IU] via SUBCUTANEOUS
  Administered 2020-07-03 (×2): 11 [IU] via SUBCUTANEOUS
  Administered 2020-07-03 – 2020-07-04 (×2): 15 [IU] via SUBCUTANEOUS
  Administered 2020-07-04: 7 [IU] via SUBCUTANEOUS

## 2020-07-02 MED ORDER — OXYCODONE HCL 5 MG PO TABS
5.0000 mg | ORAL_TABLET | Freq: Four times a day (QID) | ORAL | Status: DC | PRN
  Filled 2020-07-02: qty 1

## 2020-07-02 MED FILL — OMEPRAZOLE DR 40 MG CAPSULE: 40 | 30 days supply | Qty: 30 | Fill #0

## 2020-07-02 NOTE — ED Notes (Signed)
Family at bedside. Pt was give drink. Emptied bedpan. Pt is resting

## 2020-07-02 NOTE — ED Triage Notes (Signed)
Per ems they were called out for hyperglycemia, and altered mental status. Pt very agitated and a bad historian as well as her mom. Pt has a HX of hyperglycemia. BS read HIGH with EMS.

## 2020-07-02 NOTE — ED Notes (Signed)
Patient c/o abdominal pain and is currently vomiting. Provider made aware at this time.

## 2020-07-02 NOTE — Progress Notes (Signed)
Patient HR sustaining in 120 and up to 150-160 with activities, also pt. C/o cp when swallowing saliva  MD notified see epic for new order. Will continue to monitor the patient.

## 2020-07-02 NOTE — ED Notes (Signed)
Date and time results received: 07/02/20 0438 (use smartphrase ".now" to insert current time)  Test: Glucose Critical Value: 958  Name of Provider Notified: Pollina  Orders Received? Or Actions Taken?: none

## 2020-07-02 NOTE — ED Notes (Signed)
IV team at bedside to start second IV, blood has been requested if able for the labs due.

## 2020-07-02 NOTE — H&P (Signed)
History and Physical    Jill Shaw TKZ:[id number] DOB: 29-Dec-1975 DOA: 07/02/2020  PCP: Vevelyn Francois, NP  Patient coming from: Home  I have personally briefly reviewed patient's old medical records in [place]  Chief Complaint: AMS  HPI: Jill Shaw is a 44 y.o. female with medical history significant of DM2, dCHF, chronic pain, diabetic neuropathy.  Pt presents to the ED with AMS.  Pt with elevated BGLs at home.  Patient altered to the point of being unable to answer questions per EMS.  She did, however, become more combative during transport and required Versed for sedation.  EMS report that she was vomiting on their arrival.   ED Course: In the ED pt found to have BGL of 950, BHB of 3.8, Lactate of 4.3.  WBC 20.5k, HGB 9.7 (though the anemia appears to be chronic).  Anion gap of 22.  Pt given 2L bolus, started on insulin gtt per protocol.  UDS is neg (including for opiates), EtOH neg.  Patient's mental status thankfully has improved, she is now awake, alert, asking for pain meds.  Pt reports having intermittent episodes of N/V where she has to be admitted to hospital in past.  States she has never been formally diagnosed with diabetic gastroparesis.  Does have known diabetic painful peripheral neuropathy though (just saw neurology for this a couple of days ago).  Review of med list and records: Doesn't take any seizure meds, though has had seizures with uncontrolled BGLs in past (low im guessing), neurology didn't mention anything about seizures or seizure meds despite seeing her in office just a couple of days ago.  Review of Systems: As per HPI, otherwise all review of systems negative.  Past Medical History:  Diagnosis Date  . Anemia   . Arthritis    knees, hands  . Asthma   . Chronic diastolic (congestive) heart failure ([place])   . COPD (chronic obstructive pulmonary disease) ([place])   . Diabetes mellitus without complication ([place])    type 2  .  Dysfunctional uterine bleeding   . GERD (gastroesophageal reflux disease)   . Hypertension   . Neuromuscular disorder (HCC)    neuropathy feet  . Seizures ([place]) 09/12/2017   pt states r/t stress and blood sugar - no meds last one 4 months ago, not seen neurologist  . Sickle cell trait ([place])   . Smoker   . Vitamin D deficiency 10/2019  . Wears glasses     Past Surgical History:  Procedure Laterality Date  . CESAREAN SECTION     x 1. for twins  . DILATION AND CURETTAGE OF UTERUS N/A 08/20/2019   Procedure: DILATATION AND CURETTAGE;  Surgeon: Emily Filbert, MD;  Location: [facility];  Service: Gynecology;  Laterality: N/A;  . ENDOMETRIAL ABLATION N/A 08/20/2019   Procedure: [place] Ablation;  Surgeon: Emily Filbert, MD;  Location: [facility];  Service: Gynecology;  Laterality: N/A;  . EYE SURGERY Bilateral    laser right and cataract removed left eye  . RADIOLOGY WITH ANESTHESIA N/A 09/16/2019   Procedure: MRI WITH ANESTHESIA   L SPINE WITHOUT CONTRAST, T SPINE WITHOUT CONTRAST , CERVICAL WITHOUT CONTRAST;  Surgeon: Radiologist, Medication, MD;  Location: [facility];  Service: Radiology;  Laterality: N/A;  . TUBAL LIGATION     interval BTL  . UPPER GI ENDOSCOPY  07/2017     reports that she has been smoking cigarettes. She has a 13.00 pack-year smoking history. She has never used smokeless  tobacco. She reports that she does not drink alcohol and does not use drugs.  Allergies  Allergen Reactions  . Ketoprofen Nausea And Vomiting  . Aspirin Nausea Only  . Gabapentin Nausea And Vomiting    upset stomach  . Ibuprofen Nausea And Vomiting  . Liraglutide Nausea And Vomiting  . Naproxen Nausea And Vomiting  . Omeprazole-Sodium Bicarbonate Nausea And Vomiting  . Sulfa Antibiotics Nausea And Vomiting  . Tramadol Nausea And Vomiting    stomach upset    Family History  Problem Relation Age of Onset  . Diabetes Mother   . Hypertension Mother      Prior to Admission medications   Medication Sig  Start Date End Date Taking? Authorizing Provider  albuterol (PROVENTIL) (2.5 MG/3ML) 0.083% nebulizer solution Take 3 mLs (2.5 mg total) by nebulization every 6 (six) hours as needed for wheezing or shortness of breath. 12/02/19   Vevelyn Francois, NP  albuterol (VENTOLIN HFA) 108 (90 Base) MCG/ACT inhaler Inhale 2 puffs into the lungs every 6 (six) hours as needed for wheezing or shortness of breath. 12/02/19   Vevelyn Francois, NP  baclofen (LIORESAL) 10 MG tablet Take 0.5-1 tablets (5-10 mg total) by mouth 3 (three) times daily as needed for muscle spasms. 06/29/20   Hilts, Legrand Como, MD  blood glucose meter kit and supplies KIT Dispense based on patient and insurance preference. Use up to four times daily as directed. (FOR ICD-9 250.00, 250.01). Patient taking differently: 1 each by Other route See admin instructions. Dispense based on patient and insurance preference. Use up to four times daily as directed. (FOR ICD-9 250.00, 250.01). 02/12/20   Vevelyn Francois, NP  Blood Glucose Monitoring Suppl (TRUE METRIX METER) w/Device KIT 1 each by Does not apply route 4 (four) times daily -  before meals and at bedtime. 01/21/18   Dorena Dew, FNP  Blood Pressure Monitoring (BLOOD PRESSURE KIT) DEVI Check BP BID prn Patient taking differently: 1 each by Other route 2 (two) times daily as needed (blood pressure).  02/12/20   Vevelyn Francois, NP  budesonide-formoterol (SYMBICORT) 160-4.5 MCG/ACT inhaler INHALE 2 PUFFS INTO THE LUNGS 2 (TWO) TIMES DAILY. Patient taking differently: Inhale 2 puffs into the lungs 2 (two) times daily.  12/02/19   Vevelyn Francois, NP  cetirizine (ZYRTEC) 10 MG tablet Take 1 tablet (10 mg total) by mouth daily. 12/02/19   Vevelyn Francois, NP  Cholecalciferol (VITAMIN D-3) 125 MCG (5000 UT) TABS Take 1 tablet by mouth daily. Patient taking differently: Take 5,000 Units by mouth daily.  08/22/19   Hilts, Legrand Como, MD  diclofenac (VOLTAREN) 75 MG EC tablet Take 1 tablet (75 mg total) by mouth  2 (two) times daily as needed. Patient taking differently: Take 75 mg by mouth 2 (two) times daily as needed for mild pain.  06/07/20   Hilts, Legrand Como, MD  DULoxetine (CYMBALTA) 60 MG capsule Take 1 capsule (60 mg total) by mouth daily. 06/28/20   Narda Amber K, DO  ferrous sulfate 325 (65 FE) MG tablet Take 1 tablet (325 mg total) by mouth 3 (three) times daily with meals. Patient taking differently: Take 325 mg by mouth 2 (two) times daily with a meal.  12/02/19   Vevelyn Francois, NP  fluticasone (FLONASE) 50 MCG/ACT nasal spray Place 2 sprays into both nostrils daily. 11/24/19   Vevelyn Francois, NP  furosemide (LASIX) 40 MG tablet Take 1 tablet (40 mg total) by mouth 2 (two) times daily.  06/25/20   Cipriano Bunker, MD  Glucosamine Sulfate 1000 MG CAPS Take 1 capsule (1,000 mg total) by mouth 2 (two) times daily. 08/22/19   Hilts, Casimiro Needle, MD  glucose blood (TRUE METRIX BLOOD GLUCOSE TEST) test strip Use as instructed 04/15/20   Barbette Merino, NP  hydrocortisone 2.5 % cream Apply topically 2 (two) times daily. 03/12/20   Barbette Merino, NP  hydroquinone 4 % cream Apply topically 2 (two) times daily. 05/07/20   Barbette Merino, NP  Insulin Lispro Prot & Lispro (HUMALOG MIX 75/25 KWIKPEN) (75-25) 100 UNIT/ML Kwikpen INJECT 100 UNITS EVERY 12 HOURS Patient taking differently: Inject 100 Units into the skin every 12 (twelve) hours.  12/02/19   Barbette Merino, NP  Insulin Pen Needle (PEN NEEDLES) 30G X 5 MM MISC 1 Units by Does not apply route as directed. 12/02/19   Barbette Merino, NP  losartan (COZAAR) 50 MG tablet Take 1 tablet (50 mg total) by mouth daily. 03/12/20 06/28/20  Barbette Merino, NP  metoprolol succinate (TOPROL-XL) 100 MG 24 hr tablet Take 1 tablet (100 mg total) by mouth daily. Take with or immediately following a meal. 12/26/19   Barbette Merino, NP  norethindrone (AYGESTIN) 5 MG tablet Take 2 tablets (10 mg total) by mouth in the morning, at noon, in the evening, and at bedtime. 05/26/20    Heron Bing, MD  omeprazole (PRILOSEC) 40 MG capsule Take 1 capsule (40 mg total) by mouth daily. 12/02/19   Barbette Merino, NP  oxyCODONE (ROXICODONE) 5 MG immediate release tablet Take 1-2 tablets (5-10 mg total) by mouth every 6 (six) hours as needed for severe pain. 05/07/20   Conan Bowens, MD  potassium chloride SA (KLOR-CON) 20 MEQ tablet Take 1 tablet (20 mEq total) by mouth daily. 02/04/20   Leroy Sea, MD  promethazine (PHENERGAN) 12.5 MG tablet Take 1 tablet (12.5 mg total) by mouth every 6 (six) hours as needed for nausea or vomiting. 03/10/20   Tyrone Nine, MD  promethazine (PHENERGAN) 25 MG tablet Take 1 tablet (25 mg total) by mouth every 6 (six) hours as needed for nausea or vomiting. Patient not taking: Reported on 06/28/2020 06/11/20   Lesly Dukes, MD  rosuvastatin (CRESTOR) 5 MG tablet Take 1 tablet (5 mg total) by mouth daily. 06/03/20   Barbette Merino, NP  saxagliptin HCl (ONGLYZA) 2.5 MG TABS tablet Take 1 tablet (2.5 mg total) by mouth daily. 12/02/19   Barbette Merino, NP  Tiotropium Bromide Monohydrate (SPIRIVA RESPIMAT) 2.5 MCG/ACT AERS Inhale 2 puffs into the lungs daily. 04/09/20   Glenford Bayley, NP  tiZANidine (ZANAFLEX) 2 MG tablet Take 3 tablets (6 mg total) by mouth every 8 (eight) hours as needed for muscle spasms. 06/25/20   Barbette Merino, NP  tranexamic acid (LYSTEDA) 650 MG TABS tablet Take 2 tablets (1,300 mg total) by mouth 3 (three) times daily. Take during menses for a maximum of five days 04/23/20   [place] Bing, MD  TRUEplus Lancets 28G MISC 1 each by Other route in the morning, at noon, in the evening, and at bedtime. 02/12/20   [provider]  Turmeric 500 MG CAPS Take 500 mg by mouth 2 (two) times daily. 08/22/19   Hilts, Casimiro Needle, MD  Vitamin D, Ergocalciferol, (DRISDOL) 1.25 MG (50000 UNIT) CAPS capsule Take 1 capsule (50,000 Units total) by mouth every 7 (seven) days. Patient taking differently: Take 50,000 Units by mouth  every  Friday.  12/02/19   Vevelyn Francois, NP  hydrALAZINE (APRESOLINE) 50 MG tablet Take 1 tablet (50 mg total) by mouth 3 (three) times daily. 02/03/20 02/20/20  Thurnell Lose, MD    Physical Exam: Vitals:   07/02/20 0024 07/02/20 0026 07/02/20 0100 07/02/20 0153  BP: (!) 146/68  99/74   Pulse:  (!) 132 (!) 122 (!) 124  Resp: (!) 24  (!) 29 (!) 21  SpO2: 99% 100% 100% 100%    Constitutional: NAD, calm, comfortable Eyes: PERRL, lids and conjunctivae normal ENMT: Mucous membranes are moist. Posterior pharynx clear of any exudate or lesions.Normal dentition.  Neck: normal, supple, no masses, no thyromegaly Respiratory: clear to auscultation bilaterally, no wheezing, no crackles. Normal respiratory effort. No accessory muscle use.  Cardiovascular: Regular rate and rhythm, no murmurs / rubs / gallops. No extremity edema. 2+ pedal pulses. No carotid bruits.  Abdomen: no tenderness, no masses palpated. No hepatosplenomegaly. Bowel sounds positive.  Musculoskeletal: no clubbing / cyanosis. No joint deformity upper and lower extremities. Good ROM, no contractures. Normal muscle tone.  Skin: no rashes, lesions, ulcers. No induration Neurologic: MAE, still has stuttering speech at times but AAO x3. Psychiatric: Normal judgment and insight. Alert and oriented x 3. Normal mood.    Labs on Admission: I have personally reviewed following labs and imaging studies  CBC: Recent Labs  Lab 07/02/20 0100  WBC 20.5*  NEUTROABS 17.6*  HGB 9.7*  HCT 33.9*  MCV 69.2*  PLT 960   Basic Metabolic Panel: Recent Labs  Lab 07/02/20 0100  NA 132*  K 5.3*  CL 91*  CO2 19*  GLUCOSE 958*  BUN 22*  CREATININE 1.87*  CALCIUM 10.0   GFR: Estimated Creatinine Clearance: 49.1 mL/min (A) (by C-G formula based on SCr of 1.87 mg/dL (H)). Liver Function Tests: Recent Labs  Lab 07/02/20 0100  AST 23  ALT 17  ALKPHOS 78  BILITOT 1.7*  PROT 7.9  ALBUMIN 4.0   No results for input(s): LIPASE,  AMYLASE in the last 168 hours. Recent Labs  Lab 07/02/20 0100  AMMONIA 37*   Coagulation Profile: No results for input(s): INR, PROTIME in the last 168 hours. Cardiac Enzymes: No results for input(s): CKTOTAL, CKMB, CKMBINDEX, TROPONINI in the last 168 hours. BNP (last 3 results) No results for input(s): PROBNP in the last 8760 hours. HbA1C: No results for input(s): HGBA1C in the last 72 hours. CBG: Recent Labs  Lab 06/25/20 0618 06/25/20 1115 07/02/20 0025 07/02/20 0327 07/02/20 0500  GLUCAP 127* 207* >600* >600* >600*   Lipid Profile: No results for input(s): CHOL, HDL, LDLCALC, TRIG, CHOLHDL, LDLDIRECT in the last 72 hours. Thyroid Function Tests: No results for input(s): TSH, T4TOTAL, FREET4, T3FREE, THYROIDAB in the last 72 hours. Anemia Panel: No results for input(s): VITAMINB12, FOLATE, FERRITIN, TIBC, IRON, RETICCTPCT in the last 72 hours. Urine analysis:    Component Value Date/Time   COLORURINE YELLOW 07/02/2020 0033   APPEARANCEUR CLEAR 07/02/2020 0033   LABSPEC 1.024 07/02/2020 0033   PHURINE 6.0 07/02/2020 0033   GLUCOSEU >=500 (A) 07/02/2020 0033   HGBUR LARGE (A) 07/02/2020 0033   BILIRUBINUR NEGATIVE 07/02/2020 0033   BILIRUBINUR negative 06/02/2020 1419   BILIRUBINUR NEGATIVE 11/05/2019 1342   KETONESUR 5 (A) 07/02/2020 0033   PROTEINUR NEGATIVE 07/02/2020 0033   UROBILINOGEN 4.0 (A) 06/02/2020 1419   UROBILINOGEN 0.2 01/14/2018 1348   NITRITE NEGATIVE 07/02/2020 0033   LEUKOCYTESUR NEGATIVE 07/02/2020 0033    Radiological Exams on Admission:  CT HEAD WO CONTRAST  Result Date: 07/02/2020 CLINICAL DATA:  Encephalopathy EXAM: CT HEAD WITHOUT CONTRAST TECHNIQUE: Contiguous axial images were obtained from the base of the skull through the vertex without intravenous contrast. COMPARISON:  01/29/2020 FINDINGS: Brain: There is no mass, hemorrhage or extra-axial collection. The size and configuration of the ventricles and extra-axial CSF spaces are normal.  The brain parenchyma is normal, without acute or chronic infarction. Vascular: No abnormal hyperdensity of the major intracranial arteries or dural venous sinuses. No intracranial atherosclerosis. Skull: The visualized skull base, calvarium and extracranial soft tissues are normal. Sinuses/Orbits: No fluid levels or advanced mucosal thickening of the visualized paranasal sinuses. No mastoid or middle ear effusion. The orbits are normal. IMPRESSION: Normal head CT. Electronically Signed   By: Ulyses Jarred M.D.   On: 07/02/2020 03:35   DG Chest Port 1 View  Result Date: 07/02/2020 CLINICAL DATA:  Short of breath, altered level of consciousness, hyperglycemia EXAM: PORTABLE CHEST 1 VIEW COMPARISON:  06/22/2020 FINDINGS: Single frontal view of the chest demonstrates stable enlargement of the cardiac silhouette. There is chronic central vascular congestion without airspace disease, effusion, or pneumothorax. No acute bony abnormalities. IMPRESSION: 1. Chronic central vascular congestion. No acute intrathoracic process. Electronically Signed   By: Randa Ngo M.D.   On: 07/02/2020 00:44    EKG: Independently reviewed.  Assessment/Plan Principal Problem:   DKA (diabetic ketoacidoses) (HCC) Active Problems:   Essential hypertension   Lactic acidosis   Chronic pain   Diabetic gastroparesis (HCC)   AKI (acute kidney injury) ([place])    1. DKA / HHS - 1. DKA pathway 2. Insulin gtt 3. 2L LR bolus in ED 4. LR at 150 5. Then D5 half at 125 6. BMP Q4H 7. NPO for now 8. Ordering repeat lactate 2. Cyclic N/V and abd pain - 1. Sounds a lot like she has diabetic gastroparesis to go along with her diabetic peripheral neuropathy to me most likely. 2. EDP gave reglan, will continue reglan at $RemoveB'10mg'hbfrAlHw$  IV TID for now 3. Probably needs GI follow up 3. AMS - 1. Significantly improved / improving 2. Think the metabolic issues are the root cause 3. CT head neg 4. AKI on CKD 3a - 1. Suspect dehydration / ATN  as cause from all the N/V shes been having. 2. IVF as above 3. Strict intake and output 4. Serial BMPs 5. Chronic pain - 1. Med rec pending 2. Sounds like she is NOT on chronic opiates at this time 6. HTN - 1. Med rec pending  DVT prophylaxis: Lovenox Code Status: Full Family Communication: Mother at bedside Disposition Plan: Home after DKA resolved and able to tolerate POs Consults called: None Admission status: Admit to inpatient  Severity of Illness: The appropriate patient status for this patient is INPATIENT. Inpatient status is judged to be reasonable and necessary in order to provide the required intensity of service to ensure the patient's safety. The patient's presenting symptoms, physical exam findings, and initial radiographic and laboratory data in the context of their chronic comorbidities is felt to place them at high risk for further clinical deterioration. Furthermore, it is not anticipated that the patient will be medically stable for discharge from the hospital within 2 midnights of admission. The following factors support the patient status of inpatient.   IP status for treatment of DKA.   * I certify that at the point of admission it is my clinical judgment that the patient will require inpatient hospital care spanning beyond  2 midnights from the point of admission due to high intensity of service, high risk for further deterioration and high frequency of surveillance required.*    Esdras Delair M. DO [facility]  How to contact the [facility] Attending or Consulting provider [place] or covering provider during after hours [place], for this patient?  1. Check the care team in [facility] and look for a) attending/consulting [facility] provider listed and b) the [facility] team listed 2. Log into [email or web address]  Amion Physician Scheduling and messaging for groups and whole hospitals  On call and physician scheduling software for group practices, residents, hospitalists and other medical  providers for call, clinic, rotation and shift schedules. On[employer] is a hospital-wide system for scheduling doctors and paging doctors on call. EasyPlot is for scientific plotting and data analysis.  [email or web address]  and use [facility]'s universal password to access. If you do not have the password, please contact the hospital operator.  3. Locate the [facility] provider you are looking for under [facility] and page to a number that you can be directly reached. 4. If you still have difficulty reaching the provider, please page the [facility] (Director on Call) for the Hospitalists listed on amion for assistance.  07/02/2020, 5:49 AM

## 2020-07-02 NOTE — Progress Notes (Signed)
[facility] PROGRESS NOTE   Jill Shaw UDJ:[id number] DOB: Jul 10, 1976 DOA: 07/02/2020  PCP: Barbette Merino, NP  Brief History/Interval Summary: 44 year old African-American female with a past medical history for diabetes mellitus type 2 on insulin, chronic diastolic CHF, diabetic neuropathy, chronic pain syndrome who presented to the ED with altered mental status. She was found to have elevated glucose levels at home. Evaluation in the ED raised concern for diabetic ketoacidosis. Patient was hospitalized for further management.  Reason for Visit: Acute metabolic encephalopathy. Diabetic ketoacidosis.  Consultants: None  Procedures: None  Antibiotics: Anti-infectives (From admission, onward)   None      Subjective/Interval History: Patient's mother is at the bedside. Mother states that the patient is slightly better in terms of her mentation. Patient is able to answer some questions but noted to be confused occasionally. She continues to have nausea. Admits to some upper abdominal pain.    Assessment/Plan:  Diabetic ketoacidosis versus hyperosmolar hyperglycemic syndrome Her glucose levels were noted to be greater than 600. On the metabolic panel glucose level was 958. Bicarbonate was nineteen. Anion gap noted to be 22 at presentation. Patient was started on IV insulin. CBGs are improving. Metabolic panel from this morning shows an anion gap is reduced to 15. We will leave her on the insulin infusion for now. She is still quite nauseated. Patient and mother mentions that she has been compliant with her insulin regimen. Reason for her hyperglycemia is not entirely clear. No obvious source of infection has been found. Chest x-ray did not show any infiltrates. UA does not suggest infection. HbA1c 9.0 in August. Lactic acidosis related to her DKA. Leukocytosis probably due to volume depletion. She is afebrile. Recheck labs tomorrow. Hold off on antibacterials.  Acute  metabolic encephalopathy Most likely secondary to severe hyperglycemia. No obvious focal neurological deficits noted. CT head was unremarkable. Continue to monitor.  Nausea vomiting and abdominal pain She is primarily tender in the epigastric area. In the right upper quadrant. Likely old due to gastritis. AST ALT were normal earlier this morning. Bilirubin was 1.7. Alkaline phosphatase seventy-eight. Recheck labs tomorrow. Check lipase level. Treat her symptoms symptomatically. Consider PPI. Likely also be an element of diabetic gastroparesis. Patient has been started on metoclopramide by the admitting provider. Continue for now.  Acute kidney injury on chronic kidney disease stage IIIa Baseline creatinine noted to be around 1.2. Presented with a creatinine of 1.87. Continue hydration. Monitor urine output.  Chronic pain syndrome Continue to monitor.  Essential hypertension Reasonably well-controlled currently. Medication reconciliation has not been completed. Looks like she has been on metoprolol and Cozaar previously.  Morbid obesity Estimated body mass index is 44.8 kg/m as calculated from the following:   Height as of 06/28/20: 5\' 4"  (1.626 m).   Weight as of 06/28/20: 118.4 kg.    DVT Prophylaxis: Lovenox Code Status: Full code Family Communication: Discussed with her mother Disposition Plan: Hopefully return home when improved  Status is: Inpatient  Remains inpatient appropriate because:IV treatments appropriate due to intensity of illness or inability to take PO and Inpatient level of care appropriate due to severity of illness   Dispo: The patient is from: Home              Anticipated d/c is to: Home              Anticipated d/c date is: 3 days              Patient currently is  not medically stable to d/c.     Medications:  Scheduled: . enoxaparin (LOVENOX) injection  40 mg Subcutaneous Q24H  . metoCLOPramide (REGLAN) injection  10 mg Intravenous Q8H    Continuous: . dextrose 5 % and 0.45% NaCl Stopped (07/02/20 0511)  . insulin 7.5 Units/hr (07/02/20 0932)  . lactated ringers 150 mL/hr at 07/02/20 0714   PNT:[id number], sodium chloride flush   Objective:  Vital Signs  Vitals:   07/02/20 0026 07/02/20 0100 07/02/20 0153 07/02/20 0945  BP:  99/74  (!) 148/66  Pulse: (!) 132 (!) 122 (!) 124 (!) 108  Resp:  (!) 29 (!) 21 (!) 30  SpO2: 100% 100% 100% 97%    Intake/Output Summary (Last 24 hours) at 07/02/2020 1018 Last data filed at 07/02/2020 0086 Gross per 24 hour  Intake 2000 ml  Output --  Net 2000 ml   There were no vitals filed for this visit.  General appearance: Awake alert.  In no distress. Distracted Resp: Clear to auscultation bilaterally.  Normal effort Cardio: S1-S2 is normal regular.  No S3-S4.  No rubs murmurs or bruit GI: Abdomen is soft. Tenderness in the epigastric area as well as in the right upper quadrant without any rebound rigidity or guarding. No masses organomegaly. Bowel sounds present.  Extremities: No edema.  Full range of motion of lower extremities. Neurologic: She is awake alert. Distracted. Oriented to person place. Knew the year. No obvious focal deficits noted.    Lab Results:  Data Reviewed: I have personally reviewed following labs and imaging studies  CBC: Recent Labs  Lab 07/02/20 0100 07/02/20 0844  WBC 20.5*  --   NEUTROABS 17.6*  --   HGB 9.7* 10.2*  HCT 33.9* 30.0*  MCV 69.2*  --   PLT 311  --     Basic Metabolic Panel: Recent Labs  Lab 07/02/20 0100 07/02/20 0844 07/02/20 0853  NA 132* 139 137  K 5.3* 4.2 4.1  CL 91*  --  100  CO2 19*  --  22  GLUCOSE 958*  --  430*  BUN 22*  --  19  CREATININE 1.87*  --  1.66*  CALCIUM 10.0  --  9.5    GFR: Estimated Creatinine Clearance: 55.3 mL/min (A) (by C-G formula based on SCr of 1.66 mg/dL (H)).  Liver Function Tests: Recent Labs  Lab 07/02/20 0100  AST 23  ALT 17  ALKPHOS 78  BILITOT 1.7*  PROT 7.9   ALBUMIN 4.0     Recent Labs  Lab 07/02/20 0100  AMMONIA 37*    CBG: Recent Labs  Lab 07/02/20 0327 07/02/20 0500 07/02/20 0555 07/02/20 0813 07/02/20 0931  GLUCAP >600* >600* >600* 425* 348*     Recent Results (from the past 240 hour(s))  SARS Coronavirus 2 by RT PCR (hospital order, performed in [facility] hospital lab) Nasopharyngeal Nasopharyngeal Swab     Status: None   Collection Time: 07/02/20  5:09 AM   Specimen: Nasopharyngeal Swab  Result Value Ref Range Status   SARS Coronavirus 2 NEGATIVE NEGATIVE Final    Comment: (NOTE) SARS-CoV-2 target nucleic acids are NOT DETECTED.  The SARS-CoV-2 RNA is generally detectable in upper and lower respiratory specimens during the acute phase of infection. The lowest concentration of SARS-CoV-2 viral copies this assay can detect is 250 copies / mL. A negative result does not preclude SARS-CoV-2 infection and should not be used as the sole basis for treatment or other patient management decisions.  A negative  result may occur with improper specimen collection / handling, submission of specimen other than nasopharyngeal swab, presence of viral mutation(s) within the areas targeted by this assay, and inadequate number of viral copies (<250 copies / mL). A negative result must be combined with clinical observations, patient history, and epidemiological information.  Fact Sheet for Patients:   BoilerBrush.com.cy  Fact Sheet for Healthcare Providers: https://pope.com/  This test is not yet approved or  cleared by the [place] FDA and has been authorized for detection and/or diagnosis of SARS-CoV-2 by FDA under an Emergency Use Authorization (EUA).  This EUA will remain in effect (meaning this test can be used) for the duration of the COVID-19 declaration under Section 564(b)(1) of the Act, 21 U.S.C. section 360bbb-3(b)(1), unless the authorization is terminated  or revoked sooner.  Performed at [facility], [address], [place], [place] [address]       Radiology Studies: CT HEAD WO CONTRAST  Result Date: 07/02/2020 CLINICAL DATA:  Encephalopathy EXAM: CT HEAD WITHOUT CONTRAST TECHNIQUE: Contiguous axial images were obtained from the base of the skull through the vertex without intravenous contrast. COMPARISON:  01/29/2020 FINDINGS: Brain: There is no mass, hemorrhage or extra-axial collection. The size and configuration of the ventricles and extra-axial CSF spaces are normal. The brain parenchyma is normal, without acute or chronic infarction. Vascular: No abnormal hyperdensity of the major intracranial arteries or dural venous sinuses. No intracranial atherosclerosis. Skull: The visualized skull base, calvarium and extracranial soft tissues are normal. Sinuses/Orbits: No fluid levels or advanced mucosal thickening of the visualized paranasal sinuses. No mastoid or middle ear effusion. The orbits are normal. IMPRESSION: Normal head CT. Electronically Signed   By: Deatra Robinson M.D.   On: 07/02/2020 03:35   DG Chest Port 1 View  Result Date: 07/02/2020 CLINICAL DATA:  Short of breath, altered level of consciousness, hyperglycemia EXAM: PORTABLE CHEST 1 VIEW COMPARISON:  06/22/2020 FINDINGS: Single frontal view of the chest demonstrates stable enlargement of the cardiac silhouette. There is chronic central vascular congestion without airspace disease, effusion, or pneumothorax. No acute bony abnormalities. IMPRESSION: 1. Chronic central vascular congestion. No acute intrathoracic process. Electronically Signed   By: Sharlet Salina M.D.   On: 07/02/2020 00:44       LOS: 0 days   Kery Batzel Rito Ehrlich  [facility] Pager on [email or web address]  07/02/2020, 10:18 AM

## 2020-07-02 NOTE — Progress Notes (Signed)
Inpatient Diabetes Program Recommendations  AACE/ADA: New Consensus Statement on Inpatient Glycemic Control (2015)  Target Ranges:  Prepandial:   less than 140 mg/dL      Peak postprandial:   less than 180 mg/dL (1-2 hours)      Critically ill patients:  140 - 180 mg/dL   Lab Results  Component Value Date   GLUCAP 319 (H) 07/02/2020   HGBA1C 9.0 (A) 06/02/2020   HGBA1C 9.0 06/02/2020   HGBA1C 9.0 (A) 06/02/2020   HGBA1C 9.0 (A) 06/02/2020    Review of Glycemic Control Results for Jill Shaw, Jill Shaw (MRN [id number]) as of 07/02/2020 11:26  Ref. Range 07/02/2020 08:13 07/02/2020 09:31 07/02/2020 10:44  Glucose-Capillary Latest Ref Range: 70 - 99 mg/dL 014 (H) 103 (H) 013 (H)   Diabetes history: DM 2 Outpatient Diabetes medications:  Humalog 75/25 100 units bid, Onglyza 2.5 mg daily Current orders for Inpatient glycemic control:  IV insulin Inpatient Diabetes Program Recommendations:    Unclear what precipitated DKA?  Will talk to patient when appropriate to make sure that she has what she needs at home for DM management.   Patient requires large doses of pre-mixed insulin at home.   When patient is transitioned off insulin drip, may consider Levemir 60 units bid.  Once eating consider Novolog 12 units tid with meals.    Thanks  Beryl Meager, RN, BC-ADM Inpatient Diabetes Coordinator Pager [phone] (8a-5p)

## 2020-07-02 NOTE — ED Provider Notes (Signed)
[facility]Y DEPARTMENT Provider Note   CSN: [id number] Arrival date & time: 07/02/20  0012     History Chief Complaint  Patient presents with  . Hyperglycemia  . Altered Mental Status    Jill Shaw is a 44 y.o. female.  Patient presents to the emergency department for evaluation of altered mental status.  Patient with history of diabetes, has been experiencing elevated blood sugar, per EMS.  Patient altered to the point of being unable to answer questions per EMS.  She did, however, become more combative during transport and required Versed for sedation.  EMS report that she was vomiting on their arrival.  Level 5 caveat due to mental status change.        Past Medical History:  Diagnosis Date  . Anemia   . Arthritis    knees, hands  . Asthma   . Chronic diastolic (congestive) heart failure ([place])   . COPD (chronic obstructive pulmonary disease) ([place])   . Diabetes mellitus without complication ([place])    type 2  . Dysfunctional uterine bleeding   . GERD (gastroesophageal reflux disease)   . Hypertension   . Neuromuscular disorder (H[facility])    neuropathy feet  . Seizures ([place]) 09/12/2017   pt states r/t stress and blood sugar - no meds last one 4 months ago, not seen neurologist  . Sickle cell trait ([place])   . Smoker   . Vitamin D deficiency 10/2019  . Wears glasses     Patient Active Problem List   Diagnosis Date Noted  . Acute on chronic diastolic (congestive) heart failure ([place]) 06/22/2020  . Abdominal pain 03/08/2020  . Chronic diastolic CHF (congestive heart failure) ([place]) 03/08/2020  . Acute encephalopathy 01/29/2020  . AKI (acute kidney injury) ([place]) 01/29/2020  . Symptomatic anemia 12/19/2019  . Elevated sed rate 11/26/2019  . Elevated C-reactive protein (CRP) 11/26/2019  . Hypoglycemia 02/07/2019  . Hypotension 02/07/2019  . Lactic acidosis 02/07/2019  . Right ankle pain 02/07/2019  . Chronic obstructive pulmonary disease  ([place]) 02/05/2019  . Seizure ([place]) 07/31/2018  . Vitamin D deficiency 05/31/2017  . Gastroesophageal reflux disease 05/24/2017  . Abnormal uterine bleeding (AUB) 05/17/2017  . Anemia of chronic disease 04/06/2017  . Knee pain, chronic 03/27/2016  . Type 2 diabetes mellitus without complication, with long-term current use of insulin ([place]) 03/27/2016  . Essential hypertension 03/27/2016  . Morbid obesity ([place]) 03/27/2016  . Irritable bowel syndrome with constipation 03/27/2016  . Tobacco dependence 03/27/2016  . CKD (chronic kidney disease) stage 2, GFR 60-89 ml/min 03/25/2016  . Arthropathy, lower leg 05/04/2013  . CTS (carpal tunnel syndrome) 05/04/2013  . Peripheral edema 05/04/2013  . Chronic pain 05/13/2012  . Diabetic gastroparesis ([place]) 11/06/2006  . Hot flashes 11/06/2006    Past Surgical History:  Procedure Laterality Date  . CESAREAN SECTION     x 1. for twins  . DILATION AND CURETTAGE OF UTERUS N/A 08/20/2019   Procedure: DILATATION AND CURETTAGE;  Surgeon: Emily Filbert, MD;  Location: [facility];  Service: Gynecology;  Laterality: N/A;  . ENDOMETRIAL ABLATION N/A 08/20/2019   Procedure: [place] Ablation;  Surgeon: Emily Filbert, MD;  Location: [facility];  Service: Gynecology;  Laterality: N/A;  . EYE SURGERY Bilateral    laser right and cataract removed left eye  . RADIOLOGY WITH ANESTHESIA N/A 09/16/2019   Procedure: MRI WITH ANESTHESIA   L SPINE WITHOUT CONTRAST, T SPINE WITHOUT CONTRAST , CERVICAL WITHOUT CONTRAST;  Surgeon:  Radiologist, Medication, MD;  Location: [facility];  Service: Radiology;  Laterality: N/A;  . TUBAL LIGATION     interval BTL  . UPPER GI ENDOSCOPY  07/2017     OB History    Gravida  3   Para      Term      Preterm      AB      Living  3     SAB      TAB      Ectopic      Multiple      Live Births  3        Obstetric Comments  Vaginal x 1 and then c-section for twins        Family History  Problem Relation Age of Onset  .  Diabetes Mother   . Hypertension Mother     Social History   Tobacco Use  . Smoking status: Current Every Day Smoker    Packs/day: 0.50    Years: 26.00    Pack years: 13.00    Types: Cigarettes  . Smokeless tobacco: Never Used  . Tobacco comment: 0.5 pack/day, desires patch  Vaping Use  . Vaping Use: Never used  Substance Use Topics  . Alcohol use: No  . Drug use: No    Home Medications Prior to Admission medications   Medication Sig Start Date End Date Taking? Authorizing Provider  albuterol (PROVENTIL) (2.5 MG/3ML) 0.083% nebulizer solution Take 3 mLs (2.5 mg total) by nebulization every 6 (six) hours as needed for wheezing or shortness of breath. 12/02/19   Vevelyn Francois, NP  albuterol (VENTOLIN HFA) 108 (90 Base) MCG/ACT inhaler Inhale 2 puffs into the lungs every 6 (six) hours as needed for wheezing or shortness of breath. 12/02/19   Vevelyn Francois, NP  baclofen (LIORESAL) 10 MG tablet Take 0.5-1 tablets (5-10 mg total) by mouth 3 (three) times daily as needed for muscle spasms. 06/29/20   Hilts, Legrand Como, MD  blood glucose meter kit and supplies KIT Dispense based on patient and insurance preference. Use up to four times daily as directed. (FOR ICD-9 250.00, 250.01). Patient taking differently: 1 each by Other route See admin instructions. Dispense based on patient and insurance preference. Use up to four times daily as directed. (FOR ICD-9 250.00, 250.01). 02/12/20   Vevelyn Francois, NP  Blood Glucose Monitoring Suppl (TRUE METRIX METER) w/Device KIT 1 each by Does not apply route 4 (four) times daily -  before meals and at bedtime. 01/21/18   Dorena Dew, FNP  Blood Pressure Monitoring (BLOOD PRESSURE KIT) DEVI Check BP BID prn Patient taking differently: 1 each by Other route 2 (two) times daily as needed (blood pressure).  02/12/20   Vevelyn Francois, NP  budesonide-formoterol (SYMBICORT) 160-4.5 MCG/ACT inhaler INHALE 2 PUFFS INTO THE LUNGS 2 (TWO) TIMES DAILY. Patient  taking differently: Inhale 2 puffs into the lungs 2 (two) times daily.  12/02/19   Vevelyn Francois, NP  cetirizine (ZYRTEC) 10 MG tablet Take 1 tablet (10 mg total) by mouth daily. 12/02/19   Vevelyn Francois, NP  Cholecalciferol (VITAMIN D-3) 125 MCG (5000 UT) TABS Take 1 tablet by mouth daily. Patient taking differently: Take 5,000 Units by mouth daily.  08/22/19   Hilts, Legrand Como, MD  diclofenac (VOLTAREN) 75 MG EC tablet Take 1 tablet (75 mg total) by mouth 2 (two) times daily as needed. Patient taking differently: Take 75 mg by mouth 2 (two) times daily as  needed for mild pain.  06/07/20   Hilts, Legrand Como, MD  DULoxetine (CYMBALTA) 60 MG capsule Take 1 capsule (60 mg total) by mouth daily. 06/28/20   Narda Amber K, DO  ferrous sulfate 325 (65 FE) MG tablet Take 1 tablet (325 mg total) by mouth 3 (three) times daily with meals. Patient taking differently: Take 325 mg by mouth 2 (two) times daily with a meal.  12/02/19   Vevelyn Francois, NP  fluticasone (FLONASE) 50 MCG/ACT nasal spray Place 2 sprays into both nostrils daily. 11/24/19   Vevelyn Francois, NP  furosemide (LASIX) 40 MG tablet Take 1 tablet (40 mg total) by mouth 2 (two) times daily. 06/25/20   Shawna Clamp, MD  Glucosamine Sulfate 1000 MG CAPS Take 1 capsule (1,000 mg total) by mouth 2 (two) times daily. 08/22/19   Hilts, Legrand Como, MD  glucose blood (TRUE METRIX BLOOD GLUCOSE TEST) test strip Use as instructed 04/15/20   Vevelyn Francois, NP  hydrocortisone 2.5 % cream Apply topically 2 (two) times daily. 03/12/20   Vevelyn Francois, NP  hydroquinone 4 % cream Apply topically 2 (two) times daily. 05/07/20   Vevelyn Francois, NP  Insulin Lispro Prot & Lispro (HUMALOG MIX 75/25 KWIKPEN) (75-25) 100 UNIT/ML Kwikpen INJECT 100 UNITS EVERY 12 HOURS Patient taking differently: Inject 100 Units into the skin every 12 (twelve) hours.  12/02/19   Vevelyn Francois, NP  Insulin Pen Needle (PEN NEEDLES) 30G X 5 MM MISC 1 Units by Does not apply route as directed.  12/02/19   Vevelyn Francois, NP  losartan (COZAAR) 50 MG tablet Take 1 tablet (50 mg total) by mouth daily. 03/12/20 06/28/20  Vevelyn Francois, NP  metoprolol succinate (TOPROL-XL) 100 MG 24 hr tablet Take 1 tablet (100 mg total) by mouth daily. Take with or immediately following a meal. 12/26/19   Vevelyn Francois, NP  norethindrone (AYGESTIN) 5 MG tablet Take 2 tablets (10 mg total) by mouth in the morning, at noon, in the evening, and at bedtime. 05/26/20   Aletha Halim, MD  omeprazole (PRILOSEC) 40 MG capsule Take 1 capsule (40 mg total) by mouth daily. 12/02/19   Vevelyn Francois, NP  oxyCODONE (ROXICODONE) 5 MG immediate release tablet Take 1-2 tablets (5-10 mg total) by mouth every 6 (six) hours as needed for severe pain. 05/07/20   Sloan Leiter, MD  potassium chloride SA (KLOR-CON) 20 MEQ tablet Take 1 tablet (20 mEq total) by mouth daily. 02/04/20   Thurnell Lose, MD  promethazine (PHENERGAN) 12.5 MG tablet Take 1 tablet (12.5 mg total) by mouth every 6 (six) hours as needed for nausea or vomiting. 03/10/20   Patrecia Pour, MD  promethazine (PHENERGAN) 25 MG tablet Take 1 tablet (25 mg total) by mouth every 6 (six) hours as needed for nausea or vomiting. Patient not taking: Reported on 06/28/2020 06/11/20   Guss Bunde, MD  rosuvastatin (CRESTOR) 5 MG tablet Take 1 tablet (5 mg total) by mouth daily. 06/03/20   Vevelyn Francois, NP  saxagliptin HCl (ONGLYZA) 2.5 MG TABS tablet Take 1 tablet (2.5 mg total) by mouth daily. 12/02/19   Vevelyn Francois, NP  Tiotropium Bromide Monohydrate (SPIRIVA RESPIMAT) 2.5 MCG/ACT AERS Inhale 2 puffs into the lungs daily. 04/09/20   Martyn Ehrich, NP  tiZANidine (ZANAFLEX) 2 MG tablet Take 3 tablets (6 mg total) by mouth every 8 (eight) hours as needed for muscle spasms. 06/25/20   Dionisio David  M, NP  tranexamic acid (LYSTEDA) 650 MG TABS tablet Take 2 tablets (1,300 mg total) by mouth 3 (three) times daily. Take during menses for a maximum of five days  04/23/20   Aletha Halim, MD  TRUEplus Lancets 28G MISC 1 each by Other route in the morning, at noon, in the evening, and at bedtime. 02/12/20   [provider]  Turmeric 500 MG CAPS Take 500 mg by mouth 2 (two) times daily. 08/22/19   Hilts, Legrand Como, MD  Vitamin D, Ergocalciferol, (DRISDOL) 1.25 MG (50000 UNIT) CAPS capsule Take 1 capsule (50,000 Units total) by mouth every 7 (seven) days. Patient taking differently: Take 50,000 Units by mouth every Friday.  12/02/19   Vevelyn Francois, NP  hydrALAZINE (APRESOLINE) 50 MG tablet Take 1 tablet (50 mg total) by mouth 3 (three) times daily. 02/03/20 02/20/20  Thurnell Lose, MD    Allergies    Ketoprofen, Aspirin, Gabapentin, Ibuprofen, Liraglutide, Naproxen, Omeprazole-sodium bicarbonate, Sulfa antibiotics, and Tramadol  Review of Systems   Review of Systems  Unable to perform ROS: Mental status change    Physical Exam Updated Vital Signs BP 99/74   Pulse (!) 124   Resp (!) 21   SpO2 100%   Physical Exam Constitutional:      Appearance: She is obese.  HENT:     Head: Atraumatic.  Eyes:     Extraocular Movements: Extraocular movements intact.     Pupils: Pupils are equal, round, and reactive to light.  Cardiovascular:     Rate and Rhythm: Tachycardia present.  Pulmonary:     Effort: Tachypnea present.     Breath sounds: Normal breath sounds.  Abdominal:     Palpations: Abdomen is soft.  Musculoskeletal:        General: No swelling or deformity.     Cervical back: Neck supple.  Skin:    General: Skin is warm and dry.  Neurological:     GCS: GCS eye subscore is 4. GCS verbal subscore is 4. GCS motor subscore is 6.     Cranial Nerves: Cranial nerves are intact.     Sensory: Sensation is intact.     Motor: Motor function is intact.     ED Results / Procedures / Treatments   Labs (all labs ordered are listed, but only abnormal results are displayed) Labs Reviewed  CBC WITH DIFFERENTIAL/PLATELET - Abnormal;  Notable for the following components:      Result Value   WBC 20.5 (*)    Hemoglobin 9.7 (*)    HCT 33.9 (*)    MCV 69.2 (*)    MCH 19.8 (*)    MCHC 28.6 (*)    RDW 24.1 (*)    Neutro Abs 17.6 (*)    Abs Immature Granulocytes 0.08 (*)    All other components within normal limits  COMPREHENSIVE METABOLIC PANEL - Abnormal; Notable for the following components:   Sodium 132 (*)    Potassium 5.3 (*)    Chloride 91 (*)    CO2 19 (*)    Glucose, Bld 958 (*)    BUN 22 (*)    Creatinine, Ser 1.87 (*)    Total Bilirubin 1.7 (*)    GFR calc non Af Amer 32 (*)    GFR calc Af Amer 37 (*)    Anion gap 22 (*)    All other components within normal limits  LACTIC ACID, PLASMA - Abnormal; Notable for the following components:   Lactic Acid, Venous  4.3 (*)    All other components within normal limits  URINALYSIS, ROUTINE W REFLEX MICROSCOPIC - Abnormal; Notable for the following components:   Glucose, UA >=500 (*)    Hgb urine dipstick LARGE (*)    Ketones, ur 5 (*)    RBC / HPF >50 (*)    Bacteria, UA RARE (*)    All other components within normal limits  BETA-HYDROXYBUTYRIC ACID - Abnormal; Notable for the following components:   Beta-Hydroxybutyric Acid 3.87 (*)    All other components within normal limits  AMMONIA - Abnormal; Notable for the following components:   Ammonia 37 (*)    All other components within normal limits  CBG MONITORING, ED - Abnormal; Notable for the following components:   Glucose-Capillary >600 (*)    All other components within normal limits  CBG MONITORING, ED - Abnormal; Notable for the following components:   Glucose-Capillary >600 (*)    All other components within normal limits  SARS CORONAVIRUS 2 BY RT PCR (HOSPITAL ORDER, [facility])  RAPID URINE DRUG SCREEN, HOSP PERFORMED  ETHANOL  I-STAT BETA HCG BLOOD, ED ([facility], [facility], [facility] ONLY)  I-STAT VENOUS BLOOD GAS, ED    EKG EKG Interpretation  Date/Time:  Friday July 02 2020 00:21:07 EDT Ventricular Rate:  123 PR Interval:    QRS Duration: 86 QT Interval:  324 QTC Calculation: 464 R Axis:   86 Text Interpretation: Sinus tachycardia Borderline T abnormalities, inferior leads Confirmed by Orpah Greek [phone]) on 07/02/2020 12:36:01 AM   Radiology CT HEAD WO CONTRAST  Result Date: 07/02/2020 CLINICAL DATA:  Encephalopathy EXAM: CT HEAD WITHOUT CONTRAST TECHNIQUE: Contiguous axial images were obtained from the base of the skull through the vertex without intravenous contrast. COMPARISON:  01/29/2020 FINDINGS: Brain: There is no mass, hemorrhage or extra-axial collection. The size and configuration of the ventricles and extra-axial CSF spaces are normal. The brain parenchyma is normal, without acute or chronic infarction. Vascular: No abnormal hyperdensity of the major intracranial arteries or dural venous sinuses. No intracranial atherosclerosis. Skull: The visualized skull base, calvarium and extracranial soft tissues are normal. Sinuses/Orbits: No fluid levels or advanced mucosal thickening of the visualized paranasal sinuses. No mastoid or middle ear effusion. The orbits are normal. IMPRESSION: Normal head CT. Electronically Signed   By: Ulyses Jarred M.D.   On: 07/02/2020 03:35   DG Chest Port 1 View  Result Date: 07/02/2020 CLINICAL DATA:  Short of breath, altered level of consciousness, hyperglycemia EXAM: PORTABLE CHEST 1 VIEW COMPARISON:  06/22/2020 FINDINGS: Single frontal view of the chest demonstrates stable enlargement of the cardiac silhouette. There is chronic central vascular congestion without airspace disease, effusion, or pneumothorax. No acute bony abnormalities. IMPRESSION: 1. Chronic central vascular congestion. No acute intrathoracic process. Electronically Signed   By: Randa Ngo M.D.   On: 07/02/2020 00:44    Procedures Procedures (including critical care time)  Medications Ordered in ED Medications  insulin regular, human  (MYXREDLIN) 100 units/ 100 mL infusion (10.5 Units/hr Intravenous New Bag/Given 07/02/20 0337)  lactated ringers infusion ( Intravenous New Bag/Given 07/02/20 0341)  dextrose 5 % in lactated ringers infusion (0 mLs Intravenous Hold 07/02/20 0341)  dextrose 50 % solution 0-50 mL (has no administration in time range)  metoCLOPramide (REGLAN) injection 5 mg (5 mg Intravenous Given 07/02/20 0044)  lactated ringers bolus 1,000 mL (1,000 mLs Intravenous New Bag/Given 07/02/20 0335)    Followed by  lactated ringers bolus 1,000 mL (1,000  mLs Intravenous New Bag/Given 07/02/20 0335)    ED Course  I have reviewed the triage vital signs and the nursing notes.  Pertinent labs & imaging results that were available during my care of the patient were reviewed by me and considered in my medical decision making (see chart for details).    MDM Rules/Calculators/A&P                          Patient presents to the emergency department with altered mental status.  Patient with a history of diabetes, sugar has been elevated.  Patient was very agitated and did not answer questions appropriately at arrival.  She was clearly hypoglycemic at arrival and DKA was confirmed.  As patient was medicated and hydrated she has improved.  She is now awake and alert.  She reports that she has been having nausea and vomiting for several days with some upper abdominal pain.  This is similar to multiple recurrent episodes of abdominal pain with vomiting she has had in the past.  Abdominal exam is nonfocal, no signs of peritonitis.  Reviewing her previous records reveals that she has had this intractable vomiting and abdominal pain with suspected gastritis.  With her uncontrolled blood sugars, would query possible gastroparesis.  She has not had a GI work-up for these problems.  Patient has had significant improvement but will require hospitalization for further management of her hyperglycemia with diabetic ketoacidosis.  CRITICAL  CARE Performed by: Orpah Greek   Total critical care time: 30 minutes  Critical care time was exclusive of separately billable procedures and treating other patients.  Critical care was necessary to treat or prevent imminent or life-threatening deterioration.  Critical care was time spent personally by me on the following activities: development of treatment plan with patient and/or surrogate as well as nursing, discussions with consultants, evaluation of patient's response to treatment, examination of patient, obtaining history from patient or surrogate, ordering and performing treatments and interventions, ordering and review of laboratory studies, ordering and review of radiographic studies, pulse oximetry and re-evaluation of patient's condition.   Final Clinical Impression(s) / ED Diagnoses Final diagnoses:  Diabetic ketoacidosis without coma associated with other specified diabetes mellitus [facility])    Rx / DC Orders ED Discharge Orders    None       Janei Scheff, Gwenyth Allegra, MD 07/02/20 [phone]

## 2020-07-02 NOTE — ED Notes (Signed)
Patient is resting comfortably. Pt got a cup of water

## 2020-07-03 DIAGNOSIS — E1165 Type 2 diabetes mellitus with hyperglycemia: Secondary | ICD-10-CM

## 2020-07-03 DIAGNOSIS — G9340 Encephalopathy, unspecified: Secondary | ICD-10-CM

## 2020-07-03 DIAGNOSIS — N179 Acute kidney failure, unspecified: Secondary | ICD-10-CM

## 2020-07-03 LAB — COMPREHENSIVE METABOLIC PANEL
ALT: 14 U/L (ref 0–44)
AST: 25 U/L (ref 15–41)
Albumin: 2.9 g/dL — ABNORMAL LOW (ref 3.5–5.0)
Alkaline Phosphatase: 50 U/L (ref 38–126)
Anion gap: 9 (ref 5–15)
BUN: 17 mg/dL (ref 6–20)
CO2: 26 mmol/L (ref 22–32)
Calcium: 8.7 mg/dL — ABNORMAL LOW (ref 8.9–10.3)
Chloride: 100 mmol/L (ref 98–111)
Creatinine, Ser: 1.26 mg/dL — ABNORMAL HIGH (ref 0.44–1.00)
GFR calc Af Amer: >60 mL/min (ref >60)
GFR calc non Af Amer: 52 mL/min — ABNORMAL LOW (ref >60)
Glucose, Bld: 314 mg/dL — ABNORMAL HIGH (ref 70–99)
Potassium: 4.4 mmol/L (ref 3.5–5.1)
Sodium: 135 mmol/L (ref 135–145)
Total Bilirubin: 0.5 mg/dL (ref 0.3–1.2)
Total Protein: 6 g/dL — ABNORMAL LOW (ref 6.5–8.1)

## 2020-07-03 LAB — GLUCOSE, CAPILLARY
Glucose-Capillary: 269 mg/dL — ABNORMAL HIGH (ref 70–99)
Glucose-Capillary: 287 mg/dL — ABNORMAL HIGH (ref 70–99)
Glucose-Capillary: 289 mg/dL — ABNORMAL HIGH (ref 70–99)
Glucose-Capillary: 309 mg/dL — ABNORMAL HIGH (ref 70–99)
Glucose-Capillary: 325 mg/dL — ABNORMAL HIGH (ref 70–99)

## 2020-07-03 LAB — CBC
HCT: 28.5 % — ABNORMAL LOW (ref 36.0–46.0)
Hemoglobin: 8.2 g/dL — ABNORMAL LOW (ref 12.0–15.0)
MCH: 20 pg — ABNORMAL LOW (ref 26.0–34.0)
MCHC: 28.8 g/dL — ABNORMAL LOW (ref 30.0–36.0)
MCV: 69.7 fL — ABNORMAL LOW (ref 80.0–100.0)
Platelets: 246 10*3/uL (ref 150–400)
RBC: 4.09 MIL/uL (ref 3.87–5.11)
RDW: 23.2 % — ABNORMAL HIGH (ref 11.5–15.5)
WBC: 11.9 10*3/uL — ABNORMAL HIGH (ref 4.0–10.5)
nRBC: 0 % (ref 0.0–0.2)

## 2020-07-03 MED ORDER — PNEUMOCOCCAL VAC POLYVALENT 25 MCG/0.5ML IJ INJ
0.5000 mL | INJECTION | INTRAMUSCULAR | Status: AC
  Administered 2020-07-04: 0.5 mL via INTRAMUSCULAR
  Filled 2020-07-03: qty 0.5

## 2020-07-03 MED ORDER — ALBUTEROL SULFATE HFA 108 (90 BASE) MCG/ACT IN AERS
2.0000 | INHALATION_SPRAY | Freq: Four times a day (QID) | RESPIRATORY_TRACT | Status: DC | PRN
  Filled 2020-07-03: qty 6.7

## 2020-07-03 MED ORDER — MOMETASONE FURO-FORMOTEROL FUM 200-5 MCG/ACT IN AERO
2.0000 | INHALATION_SPRAY | Freq: Two times a day (BID) | RESPIRATORY_TRACT | Status: DC
  Administered 2020-07-03 – 2020-07-04 (×2): 2 via RESPIRATORY_TRACT
  Filled 2020-07-03: qty 8.8

## 2020-07-03 MED ORDER — SUCRALFATE 1 GM/10ML PO SUSP
1.0000 g | Freq: Three times a day (TID) | ORAL | Status: DC
  Administered 2020-07-03 – 2020-07-04 (×5): 1 g via ORAL
  Filled 2020-07-03 (×5): qty 10

## 2020-07-03 MED ORDER — TIOTROPIUM BROMIDE MONOHYDRATE 2.5 MCG/ACT IN AERS: 2.0000 | INHALATION_SPRAY | Freq: Every day | RESPIRATORY_TRACT | Status: DC

## 2020-07-03 MED ORDER — MORPHINE SULFATE (PF) 2 MG/ML IV SOLN
1.0000 mg | Freq: Once | INTRAVENOUS | Status: AC | PRN
  Administered 2020-07-04: 1 mg via INTRAVENOUS
  Filled 2020-07-03: qty 1

## 2020-07-03 MED ORDER — INFLUENZA VAC SPLIT QUAD 0.5 ML IM SUSY
0.5000 mL | PREFILLED_SYRINGE | INTRAMUSCULAR | Status: AC
  Administered 2020-07-04: 0.5 mL via INTRAMUSCULAR
  Filled 2020-07-03: qty 0.5

## 2020-07-03 MED ORDER — INSULIN DETEMIR 100 UNIT/ML ~~LOC~~ SOLN
60.0000 [IU] | Freq: Two times a day (BID) | SUBCUTANEOUS | Status: DC
  Administered 2020-07-03 – 2020-07-04 (×3): 60 [IU] via SUBCUTANEOUS
  Filled 2020-07-03 (×4): qty 0.6

## 2020-07-03 MED ORDER — PANTOPRAZOLE SODIUM 40 MG PO TBEC
40.0000 mg | DELAYED_RELEASE_TABLET | Freq: Two times a day (BID) | ORAL | Status: DC
  Administered 2020-07-03 – 2020-07-04 (×3): 40 mg via ORAL
  Filled 2020-07-03 (×3): qty 1

## 2020-07-03 MED ORDER — METOCLOPRAMIDE HCL 5 MG PO TABS
10.0000 mg | ORAL_TABLET | Freq: Three times a day (TID) | ORAL | Status: DC
  Administered 2020-07-03 – 2020-07-04 (×5): 10 mg via ORAL
  Filled 2020-07-03 (×5): qty 2

## 2020-07-03 MED ORDER — ROSUVASTATIN CALCIUM 5 MG PO TABS
5.0000 mg | ORAL_TABLET | Freq: Every day | ORAL | Status: DC
  Administered 2020-07-03 – 2020-07-04 (×2): 5 mg via ORAL
  Filled 2020-07-03 (×2): qty 1

## 2020-07-03 MED ORDER — LORATADINE 10 MG PO TABS
10.0000 mg | ORAL_TABLET | Freq: Every day | ORAL | Status: DC
  Administered 2020-07-03 – 2020-07-04 (×2): 10 mg via ORAL
  Filled 2020-07-03 (×2): qty 1

## 2020-07-03 MED ORDER — UMECLIDINIUM BROMIDE 62.5 MCG/INH IN AEPB
1.0000 | INHALATION_SPRAY | Freq: Every day | RESPIRATORY_TRACT | Status: DC
  Administered 2020-07-04: 08:00:00 1 via RESPIRATORY_TRACT
  Filled 2020-07-03: qty 7

## 2020-07-03 NOTE — Plan of Care (Signed)
  Problem: Activity: Goal: Capacity to carry out activities will improve Outcome: Progressing   Problem: Clinical Measurements: Goal: Respiratory complications will improve Outcome: Progressing   

## 2020-07-03 NOTE — Progress Notes (Signed)
[facility] PROGRESS NOTE   Jill Shaw:[id number] DOB: 1976/09/16 DOA: 07/02/2020  PCP: Barbette Merino, NP  Brief History/Interval Summary: 44 year old African-American female with a past medical history for diabetes mellitus type 2 on insulin, chronic diastolic CHF, diabetic neuropathy, chronic pain syndrome who presented to the ED with altered mental status. She was found to have elevated glucose levels at home. Evaluation in the ED raised concern for diabetic ketoacidosis. Patient was hospitalized for further management.  Reason for Visit: Acute metabolic encephalopathy. Diabetic ketoacidosis.  Consultants: None  Procedures: None  Antibiotics: Anti-infectives (From admission, onward)   None      Subjective: Patient states that she is feeling better.  Yesterday evening patient was mentioning discomfort in the chest when she would swallow.  She is able to swallow things down.  She does admit to a significant history of acid reflux.  Her abdominal pain has improved.  Nausea is better.      Assessment/Plan:  Diabetic ketoacidosis versus hyperosmolar hyperglycemic syndrome/diabetic neuropathy Her glucose levels were noted to be greater than 600. On the metabolic panel glucose level was 958. Bicarbonate was 19. Anion gap noted to be 22 at presentation. Patient was started on IV insulin.  Patient was transitioned to subcutaneous basal insulin once her anion gap corrected.  HbA1c 9.0 in August. CBGs are poorly controlled.  Will increase the dose of the Levemir. Reason for DKA is not clear.  WBC was noted to be elevated however it has improved.  Leukocytosis most likely reactive. UA did not suggest infection.  Chest x-ray does not show any infiltrates.  She did have a temperature of 99.4 yesterday but noted to be afebrile this morning.  Continue to monitor. Lactic acidosis related to DKA and hypovolemia.  Improved.  Acute metabolic encephalopathy Most likely  secondary to severe hyperglycemia. No obvious focal neurological deficits noted. CT head was unremarkable.  Mentation has improved.  Seems to be back to her baseline.  Nausea vomiting and abdominal pain/concern for diabetic gastroparesis/acute gastritis/GERD Most likely secondary to diabetic ketoacidosis and acute gastritis.  She does report a history of GERD. Abdomen is nontender this morning.  LFTs are normal.  Lipase was normal.  Increase dose of PPI.  May consider using the Carafate for a few days. There could also be an element of diabetic gastroparesis.  Patient was started on metoclopramide.  Change to oral.    Acute kidney injury on chronic kidney disease stage IIIa with diabetic neuropathy Baseline creatinine noted to be around 1.2. Presented with a creatinine of 1.87.  Likely due to hypovolemia.  Patient was aggressively hydrated.  Creatinine is down to 1.26.  Stop IV fluids.  Microcytic anemia Patient with menorrhagia.  No other overt bleeding noted.  Hemoglobin noted to be lower today which is likely due to dilution.  Continue to monitor.  History of asthma Resume inhalers.  Chronic pain syndrome Continue to monitor.  Chronic diastolic CHF No evidence for CHF exacerbation.  Stop IV fluids.  Monitor ins and outs.  Essential hypertension sinus tachycardia Sinus tachycardia likely due to rebound from lack of beta-blocker.  She is known to be on metoprolol.  This was resumed yesterday with improvement in heart rate.  Blood pressure is reasonably well controlled.  Continue to hold Cozaar.    Morbid obesity Estimated body mass index is 45.11 kg/m as calculated from the following:   Height as of this encounter: 5\' 4"  (1.626 m).   Weight as of this encounter:  119.2 kg.    DVT Prophylaxis: Lovenox Code Status: Full code Family Communication: Discussed with the patient Disposition Plan: Hopefully return home when improved.  Start mobilizing.  Out of bed to chair.  Status is:  Inpatient  Remains inpatient appropriate because:IV treatments appropriate due to intensity of illness or inability to take PO and Inpatient level of care appropriate due to severity of illness   Dispo: The patient is from: Home              Anticipated d/c is to: Home              Anticipated d/c date is: 1 day              Patient currently is not medically stable to d/c.     Medications:  Scheduled: . insulin aspart  0-20 Units Subcutaneous TID WC  . insulin aspart  0-5 Units Subcutaneous QHS  . insulin detemir  60 Units Subcutaneous BID  . metoCLOPramide (REGLAN) injection  10 mg Intravenous Q8H  . metoprolol tartrate  50 mg Oral BID  . nicotine  14 mg Transdermal Daily  . pantoprazole (PROTONIX) IV  40 mg Intravenous Daily  . tranexamic acid  1,300 mg Oral TID   Continuous:  ZOX:[id number]:dextrose, metoprolol tartrate, sodium chloride flush, tiZANidine   Objective:  Vital Signs  Vitals:   07/02/20 2302 07/03/20 0141 07/03/20 0500 07/03/20 0821  BP: (!) 157/79 (!) 104/46 (!) 101/51 139/70  Pulse: 87 75 77 (!) 102  Resp: 20 20  20   Temp: 98 F (36.7 C) 98.4 F (36.9 C) 98.5 F (36.9 C) 98.3 F (36.8 C)  TempSrc: Oral Oral Oral Oral  SpO2: 100% 100% 100% 100%  Weight:   119.2 kg   Height:   5\' 4"  (1.626 m)     Intake/Output Summary (Last 24 hours) at 07/03/2020 0844 Last data filed at 07/03/2020 0500 Gross per 24 hour  Intake 249.5 ml  Output 450 ml  Net -200.5 ml   Filed Weights   07/02/20 1436 07/03/20 0500  Weight: 118.4 kg 119.2 kg    General appearance: Awake alert.  In no distress.  More communicative today.   Resp: Clear to auscultation bilaterally.  Normal effort Cardio: S1-S2 is normal regular.  No S3-S4.  No rubs murmurs or bruit GI: Abdomen is soft.  Nontender this morning.  Bowel sounds present.  No masses organomegaly.   Extremities: No edema.  Full range of motion of lower extremities. Neurologic: Alert and oriented x3.  No focal neurological  deficits.     Lab Results:  Data Reviewed: I have personally reviewed following labs and imaging studies  CBC: Recent Labs  Lab 07/02/20 0100 07/02/20 0844 07/03/20 0718  WBC 20.5*  --  11.9*  NEUTROABS 17.6*  --   --   HGB 9.7* 10.2* 8.2*  HCT 33.9* 30.0* 28.5*  MCV 69.2*  --  69.7*  PLT 311  --  246    Basic Metabolic Panel: Recent Labs  Lab 07/02/20 0100 07/02/20 0100 07/02/20 0844 07/02/20 0853 07/02/20 1026 07/02/20 1345 07/03/20 0500  NA 132*   < > 139 137 139 141 135  K 5.3*   < > 4.2 4.1 4.3 4.0 4.4  CL 91*  --   --  100 102 101 100  CO2 19*  --   --  22 24 25 26   GLUCOSE 958*  --   --  430* 335* 221* 314*  BUN 22*  --   --  19 21* 20 17  CREATININE 1.87*  --   --  1.66* 1.77* 1.51* 1.26*  CALCIUM 10.0  --   --  9.5 9.7 9.6 8.7*   < > = values in this interval not displayed.    GFR: Estimated Creatinine Clearance: 73.2 mL/min (A) (by C-G formula based on SCr of 1.26 mg/dL (H)).  Liver Function Tests: Recent Labs  Lab 07/02/20 0100 07/03/20 0500  AST 23 25  ALT 17 14  ALKPHOS 78 50  BILITOT 1.7* 0.5  PROT 7.9 6.0*  ALBUMIN 4.0 2.9*     Recent Labs  Lab 07/02/20 0100  AMMONIA 37*    CBG: Recent Labs  Lab 07/02/20 1947 07/02/20 2036 07/02/20 2323 07/03/20 0046 07/03/20 0657  GLUCAP 250* 195* 248* 325* 289*     Recent Results (from the past 240 hour(s))  SARS Coronavirus 2 by RT PCR (hospital order, performed in [facility] hospital lab) Nasopharyngeal Nasopharyngeal Swab     Status: None   Collection Time: 07/02/20  5:09 AM   Specimen: Nasopharyngeal Swab  Result Value Ref Range Status   SARS Coronavirus 2 NEGATIVE NEGATIVE Final    Comment: (NOTE) SARS-CoV-2 target nucleic acids are NOT DETECTED.  The SARS-CoV-2 RNA is generally detectable in upper and lower respiratory specimens during the acute phase of infection. The lowest concentration of SARS-CoV-2 viral copies this assay can detect is 250 copies / mL. A negative  result does not preclude SARS-CoV-2 infection and should not be used as the sole basis for treatment or other patient management decisions.  A negative result may occur with improper specimen collection / handling, submission of specimen other than nasopharyngeal swab, presence of viral mutation(s) within the areas targeted by this assay, and inadequate number of viral copies (<250 copies / mL). A negative result must be combined with clinical observations, patient history, and epidemiological information.  Fact Sheet for Patients:   BoilerBrush.com.cy  Fact Sheet for Healthcare Providers: https://pope.com/  This test is not yet approved or  cleared by the [place] FDA and has been authorized for detection and/or diagnosis of SARS-CoV-2 by FDA under an Emergency Use Authorization (EUA).  This EUA will remain in effect (meaning this test can be used) for the duration of the COVID-19 declaration under Section 564(b)(1) of the Act, 21 U.S.C. section 360bbb-3(b)(1), unless the authorization is terminated or revoked sooner.  Performed at [facility], [address], [place], [place] [address]       Radiology Studies: CT HEAD WO CONTRAST  Result Date: 07/02/2020 CLINICAL DATA:  Encephalopathy EXAM: CT HEAD WITHOUT CONTRAST TECHNIQUE: Contiguous axial images were obtained from the base of the skull through the vertex without intravenous contrast. COMPARISON:  01/29/2020 FINDINGS: Brain: There is no mass, hemorrhage or extra-axial collection. The size and configuration of the ventricles and extra-axial CSF spaces are normal. The brain parenchyma is normal, without acute or chronic infarction. Vascular: No abnormal hyperdensity of the major intracranial arteries or dural venous sinuses. No intracranial atherosclerosis. Skull: The visualized skull base, calvarium and extracranial soft tissues are normal. Sinuses/Orbits: No fluid levels  or advanced mucosal thickening of the visualized paranasal sinuses. No mastoid or middle ear effusion. The orbits are normal. IMPRESSION: Normal head CT. Electronically Signed   By: Deatra Robinson M.D.   On: 07/02/2020 03:35   DG Chest Port 1 View  Result Date: 07/02/2020 CLINICAL DATA:  Hyperglycemia, altered mental status EXAM: PORTABLE CHEST 1 VIEW COMPARISON:  Radiograph 07/02/2020 FINDINGS:  Mild central vascular congestion is similar to prior. Some increasing hazy and interstitial opacities are present towards the lung bases which could reflect atelectatic change though developing edema could present similarly. No pneumothorax or effusion. Stable cardiomegaly. Remaining cardiomediastinal contours are unremarkable. No acute osseous or soft tissue abnormality. Telemetry leads overlie the chest. IMPRESSION: Mild cardiomegaly and central vascular congestion is similar to prior. Some increasing hazy and interstitial opacities towards the lung bases which could reflect edema or atelectasis. Electronically Signed   By: Kreg Shropshire M.D.   On: 07/02/2020 19:35   DG Chest Port 1 View  Result Date: 07/02/2020 CLINICAL DATA:  Short of breath, altered level of consciousness, hyperglycemia EXAM: PORTABLE CHEST 1 VIEW COMPARISON:  06/22/2020 FINDINGS: Single frontal view of the chest demonstrates stable enlargement of the cardiac silhouette. There is chronic central vascular congestion without airspace disease, effusion, or pneumothorax. No acute bony abnormalities. IMPRESSION: 1. Chronic central vascular congestion. No acute intrathoracic process. Electronically Signed   By: Sharlet Salina M.D.   On: 07/02/2020 00:44       LOS: 1 day   Purcell Jungbluth  [facility] Pager on [email or web address]  07/03/2020, 8:44 AM

## 2020-07-04 DIAGNOSIS — Z794 Long term (current) use of insulin: Secondary | ICD-10-CM

## 2020-07-04 DIAGNOSIS — E119 Type 2 diabetes mellitus without complications: Secondary | ICD-10-CM

## 2020-07-04 LAB — COMPREHENSIVE METABOLIC PANEL
ALT: 15 U/L (ref 0–44)
AST: 33 U/L (ref 15–41)
Albumin: 3.3 g/dL — ABNORMAL LOW (ref 3.5–5.0)
Alkaline Phosphatase: 64 U/L (ref 38–126)
Anion gap: 11 (ref 5–15)
BUN: 10 mg/dL (ref 6–20)
CO2: 21 mmol/L — ABNORMAL LOW (ref 22–32)
Calcium: 9.2 mg/dL (ref 8.9–10.3)
Chloride: 104 mmol/L (ref 98–111)
Creatinine, Ser: 1.16 mg/dL — ABNORMAL HIGH (ref 0.44–1.00)
GFR calc Af Amer: >60 mL/min (ref >60)
GFR calc non Af Amer: 58 mL/min — ABNORMAL LOW (ref >60)
Glucose, Bld: 370 mg/dL — ABNORMAL HIGH (ref 70–99)
Potassium: 4.7 mmol/L (ref 3.5–5.1)
Sodium: 136 mmol/L (ref 135–145)
Total Bilirubin: 0.5 mg/dL (ref 0.3–1.2)
Total Protein: 6.7 g/dL (ref 6.5–8.1)

## 2020-07-04 LAB — GLUCOSE, CAPILLARY
Glucose-Capillary: 246 mg/dL — ABNORMAL HIGH (ref 70–99)
Glucose-Capillary: 335 mg/dL — ABNORMAL HIGH (ref 70–99)

## 2020-07-04 LAB — CBC
HCT: 31.9 % — ABNORMAL LOW (ref 36.0–46.0)
Hemoglobin: 9.2 g/dL — ABNORMAL LOW (ref 12.0–15.0)
MCH: 20.6 pg — ABNORMAL LOW (ref 26.0–34.0)
MCHC: 28.8 g/dL — ABNORMAL LOW (ref 30.0–36.0)
MCV: 71.4 fL — ABNORMAL LOW (ref 80.0–100.0)
Platelets: 256 10*3/uL (ref 150–400)
RBC: 4.47 MIL/uL (ref 3.87–5.11)
RDW: 24.2 % — ABNORMAL HIGH (ref 11.5–15.5)
WBC: 13.1 10*3/uL — ABNORMAL HIGH (ref 4.0–10.5)
nRBC: 0 % (ref 0.0–0.2)

## 2020-07-04 MED ORDER — SUCRALFATE 1 GM/10ML PO SUSP: 1.0000 g | Freq: Three times a day (TID) | ORAL | 0 refills | Status: DC

## 2020-07-04 MED ORDER — INSULIN LISPRO PROT & LISPRO (75-25 MIX) 100 UNIT/ML KWIKPEN: PEN_INJECTOR | SUBCUTANEOUS | 11 refills | Status: DC

## 2020-07-04 MED ORDER — METOCLOPRAMIDE HCL 10 MG PO TABS
10.0000 mg | ORAL_TABLET | Freq: Three times a day (TID) | ORAL | 1 refills | Status: DC
Start: 2020-07-04 — End: 2020-10-17

## 2020-07-04 NOTE — Discharge Summary (Signed)
[facility]  Physician Discharge Summary   Patient ID: Jill Shaw MRN: [id number] DOB/AGE: Jan 17, 1976 44 y.o.  Admit date: 07/02/2020 Discharge date: 07/04/2020  PCP: Vevelyn Francois, NP  DISCHARGE DIAGNOSES:  DKA, resolved Uncontrolled DM2, with hyperglycemia Diabetic Gastroparesis Acute Gastritis CKD Stage 3a Chronic Diastolic CHF   RECOMMENDATIONS FOR OUTPATIENT FOLLOW UP: 1. Patient instructed to f/u with PCP for further managament of diabetes    Home Health:None  Equipment/Devices:None   CODE STATUS:Full Code   DISCHARGE CONDITION: fair  Diet recommendation: Mod Carb   INITIAL HISTORY: 44 year old African-American female with a past medical history for diabetes mellitus type 2 on insulin, chronic diastolic CHF, diabetic neuropathy, chronic pain syndrome who presented to the ED with altered mental status. She was found to have elevated glucose levels at home. Evaluation in the ED raised concern for diabetic ketoacidosis. Patient was hospitalized for further management.    HOSPITAL COURSE:   Uncontrolled DM2 with DKA/diabetic neuropathy Her glucose levels were noted to be greater than 600. On the metabolic panel glucose level was 958. Bicarbonate was 19. Anion gap noted to be 22 at presentation. Patient was started on IV insulin.  Patient was transitioned to subcutaneous basal insulin once her anion gap corrected.  HbA1c 9.0 in August. Reason for DKA is not clear.  WBC was noted to be elevated however it has improved.  Leukocytosis most likely reactive. UA did not suggest infection.  Chest x-ray does not show any infiltrates.  Lactic acidosis related to DKA and hypovolemia.  Patient has improved. CBG still not optimal. This can be further addressed in the outpatient setting.  Acute metabolic encephalopathy Most likely secondary to severe hyperglycemia. No obvious focal neurological deficits noted. CT head was unremarkable. Mentation has improved and  she is back to her baseline.  Diabetic gastroparesis/acute gastritis/GERD Most likely secondary to diabetic ketoacidosis and acute gastritis.  She does report a history of GERD. Started on PPI and carafate with improvement. LFTs are normal.  Lipase was normal.  Increase dose of PPI.  There could also be an element of diabetic gastroparesis.  Patient was started on metoclopramide.  Acute kidney injury on chronic kidney disease stage IIIa with diabetic neuropathy Baseline creatinine noted to be around 1.2. Presented with a creatinine of 1.87.  Likely due to hypovolemia.  Patient was aggressively hydrated.  Creatinine is down to 1.16.   Microcytic anemia Patient with menorrhagia.  No other overt bleeding noted.  Hemoglobin noted to be lower today which is likely due to dilution. Stable today.  History of asthma Resume inhalers.  Chronic pain syndrome   Chronic diastolic CHF No evidence for CHF exacerbation.    Essential hypertension sinus tachycardia Sinus tachycardia likely due to rebound from lack of beta-blocker.  She is known to be on metoprolol.  This was resumed yesterday with improvement in heart rate.  Blood pressure is reasonably well controlled. Resume Cozaar.  Morbid obesity Estimated body mass index is 45.11 kg/m as calculated from the following:   Height as of this encounter: $RemoveBeforeD'5\' 4"'zzhJILkxKhjUgl$  (1.626 m).   Weight as of this encounter: 119.2 kg.    Overall patient is stable and improved. She feels better and wants to go home. [place] for discharge home today.    PERTINENT LABS:  The results of significant diagnostics from this hospitalization (including imaging, microbiology, ancillary and laboratory) are listed below for reference.    Microbiology: Recent Results (from the past 240 hour(s))  SARS Coronavirus 2 by RT PCR (  hospital order, performed in [facility] hospital lab) Nasopharyngeal Nasopharyngeal Swab     Status: None   Collection Time: 07/02/20  5:09 AM    Specimen: Nasopharyngeal Swab  Result Value Ref Range Status   SARS Coronavirus 2 NEGATIVE NEGATIVE Final    Comment: (NOTE) SARS-CoV-2 target nucleic acids are NOT DETECTED.  The SARS-CoV-2 RNA is generally detectable in upper and lower respiratory specimens during the acute phase of infection. The lowest concentration of SARS-CoV-2 viral copies this assay can detect is 250 copies / mL. A negative result does not preclude SARS-CoV-2 infection and should not be used as the sole basis for treatment or other patient management decisions.  A negative result may occur with improper specimen collection / handling, submission of specimen other than nasopharyngeal swab, presence of viral mutation(s) within the areas targeted by this assay, and inadequate number of viral copies (<250 copies / mL). A negative result must be combined with clinical observations, patient history, and epidemiological information.  Fact Sheet for Patients:   StrictlyIdeas.no  Fact Sheet for Healthcare Providers: BankingDealers.co.za  This test is not yet approved or  cleared by the [place] FDA and has been authorized for detection and/or diagnosis of SARS-CoV-2 by FDA under an Emergency Use Authorization (EUA).  This EUA will remain in effect (meaning this test can be used) for the duration of the COVID-19 declaration under Section 564(b)(1) of the Act, 21 U.S.C. section 360bbb-3(b)(1), unless the authorization is terminated or revoked sooner.  Performed at [facility], [place] [address], [place], [place] [address]      Labs:     Basic Metabolic Panel: Recent Labs  Lab 07/02/20 0853 07/02/20 1026 07/02/20 1345 07/03/20 0500 07/04/20 1209  NA 137 139 141 135 136  K 4.1 4.3 4.0 4.4 4.7  CL 100 102 101 100 104  CO2 $Re'22 24 25 26 'UDv$ 21*  GLUCOSE 430* 335* 221* 314* 370*  BUN 19 21* $Remo'20 17 10  'IXMJW$ CREATININE 1.66* 1.77* 1.51* 1.26* 1.16*  CALCIUM 9.5  9.7 9.6 8.7* 9.2   Liver Function Tests: Recent Labs  Lab 07/02/20 0100 07/03/20 0500 07/04/20 1209  AST 23 25 33  ALT $Re'17 14 15  'VmU$ ALKPHOS 78 50 64  BILITOT 1.7* 0.5 0.5  PROT 7.9 6.0* 6.7  ALBUMIN 4.0 2.9* 3.3*   Recent Labs  Lab 07/02/20 1345  LIPASE 29   Recent Labs  Lab 07/02/20 0100  AMMONIA 37*   CBC: Recent Labs  Lab 07/02/20 0100 07/02/20 0844 07/03/20 0718 07/04/20 1209  WBC 20.5*  --  11.9* 13.1*  NEUTROABS 17.6*  --   --   --   HGB 9.7* 10.2* 8.2* 9.2*  HCT 33.9* 30.0* 28.5* 31.9*  MCV 69.2*  --  69.7* 71.4*  PLT 311  --  246 256   BNP: BNP (last 3 results) Recent Labs    02/02/20 0220 02/03/20 0341 06/22/20 0636  BNP 277.7* 125.5* 266.3*     CBG: Recent Labs  Lab 07/03/20 1132 07/03/20 1620 07/03/20 2034 07/04/20 0706 07/04/20 1120  GLUCAP 309* 287* 269* 246* 335*     IMAGING STUDIES CT HEAD WO CONTRAST  Result Date: 07/02/2020 CLINICAL DATA:  Encephalopathy EXAM: CT HEAD WITHOUT CONTRAST TECHNIQUE: Contiguous axial images were obtained from the base of the skull through the vertex without intravenous contrast. COMPARISON:  01/29/2020 FINDINGS: Brain: There is no mass, hemorrhage or extra-axial collection. The size and configuration of the ventricles and extra-axial CSF spaces are normal.  The brain parenchyma is normal, without acute or chronic infarction. Vascular: No abnormal hyperdensity of the major intracranial arteries or dural venous sinuses. No intracranial atherosclerosis. Skull: The visualized skull base, calvarium and extracranial soft tissues are normal. Sinuses/Orbits: No fluid levels or advanced mucosal thickening of the visualized paranasal sinuses. No mastoid or middle ear effusion. The orbits are normal. IMPRESSION: Normal head CT. Electronically Signed   By: Ulyses Jarred M.D.   On: 07/02/2020 03:35   DG Chest Port 1 View  Result Date: 07/02/2020 CLINICAL DATA:  Hyperglycemia, altered mental status EXAM: PORTABLE CHEST 1  VIEW COMPARISON:  Radiograph 07/02/2020 FINDINGS: Mild central vascular congestion is similar to prior. Some increasing hazy and interstitial opacities are present towards the lung bases which could reflect atelectatic change though developing edema could present similarly. No pneumothorax or effusion. Stable cardiomegaly. Remaining cardiomediastinal contours are unremarkable. No acute osseous or soft tissue abnormality. Telemetry leads overlie the chest. IMPRESSION: Mild cardiomegaly and central vascular congestion is similar to prior. Some increasing hazy and interstitial opacities towards the lung bases which could reflect edema or atelectasis. Electronically Signed   By: Lovena Le M.D.   On: 07/02/2020 19:35   DG Chest Port 1 View  Result Date: 07/02/2020 CLINICAL DATA:  Short of breath, altered level of consciousness, hyperglycemia EXAM: PORTABLE CHEST 1 VIEW COMPARISON:  06/22/2020 FINDINGS: Single frontal view of the chest demonstrates stable enlargement of the cardiac silhouette. There is chronic central vascular congestion without airspace disease, effusion, or pneumothorax. No acute bony abnormalities. IMPRESSION: 1. Chronic central vascular congestion. No acute intrathoracic process. Electronically Signed   By: Randa Ngo M.D.   On: 07/02/2020 00:44   DG Chest Port 1 View  Result Date: 06/22/2020 CLINICAL DATA:  Shortness of breath. EXAM: PORTABLE CHEST 1 VIEW COMPARISON:  CT 03/08/2020.  Chest x-ray 03/08/2020. FINDINGS: Cardiomegaly with diffuse bilateral pulmonary interstitial prominence suggesting CHF. Pneumonitis cannot be excluded. No prominent pleural effusion. No pneumothorax. IMPRESSION: Cardiomegaly with diffuse bilateral pulmonary interstitial prominence suggesting CHF. Pneumonitis cannot be excluded. Electronically Signed   By: Marcello Moores  Register   On: 06/22/2020 06:47   VAS [place] LOWER EXTREMITY VENOUS (DVT) (ONLY [facility] & [facility])  Result Date: 06/22/2020  Lower Venous DVTStudy  Indications: Edema.  Comparison Study: No prior studies. Performing Technologist: Darlin Coco  Examination Guidelines: A complete evaluation includes B-mode imaging, spectral Doppler, color Doppler, and power Doppler as needed of all accessible portions of each vessel. Bilateral testing is considered an integral part of a complete examination. Limited examinations for reoccurring indications may be performed as noted. The reflux portion of the exam is performed with the patient in reverse Trendelenburg.  +---------+---------------+---------+-----------+----------+-------------------+ RIGHT    CompressibilityPhasicitySpontaneityPropertiesThrombus Aging      +---------+---------------+---------+-----------+----------+-------------------+ CFV      Full           Yes      Yes                                      +---------+---------------+---------+-----------+----------+-------------------+ SFJ      Full                                                             +---------+---------------+---------+-----------+----------+-------------------+  FV Prox  Full                                                             +---------+---------------+---------+-----------+----------+-------------------+ FV Mid   Full                                                             +---------+---------------+---------+-----------+----------+-------------------+ FV DistalFull                                                             +---------+---------------+---------+-----------+----------+-------------------+ PFV      Full                                                             +---------+---------------+---------+-----------+----------+-------------------+ POP      Full           Yes      Yes                                      +---------+---------------+---------+-----------+----------+-------------------+ PTV      Full                                                              +---------+---------------+---------+-----------+----------+-------------------+ PERO     Full                                         Some segments not                                                         visualized.         +---------+---------------+---------+-----------+----------+-------------------+   +----+---------------+---------+-----------+----------+--------------+ LEFTCompressibilityPhasicitySpontaneityPropertiesThrombus Aging +----+---------------+---------+-----------+----------+--------------+ CFV Full           Yes      Yes                                 +----+---------------+---------+-----------+----------+--------------+     Summary: RIGHT: - There is no evidence of deep vein thrombosis in the lower extremity.  - No cystic structure found in the popliteal fossa.   *See table(s) above  for measurements and observations. Electronically signed by Waverly Ferrari MD on 06/22/2020 at 12:54:44 PM.    Final     DISCHARGE EXAMINATION: Vitals:   07/04/20 0029 07/04/20 0816 07/04/20 1001 07/04/20 1124  BP: 118/62  (!) 148/72 139/79  Pulse: 87  80 79  Resp: 19   18  Temp: 98.7 F (37.1 C)   98.4 F (36.9 C)  TempSrc: Oral   Oral  SpO2: 100% 98% 100% 100%  Weight: 119.2 kg     Height:       General appearance: alert, cooperative, appears stated age and no distress Resp: clear to auscultation bilaterally Cardio: regular rate and rhythm, S1, S2 normal, no murmur, click, rub or gallop GI: soft, non-tender; bowel sounds normal; no masses,  no organomegaly  DISPOSITION: Home  Discharge Instructions    Call MD for:  difficulty breathing, headache or visual disturbances   Complete by: As directed    Call MD for:  extreme fatigue   Complete by: As directed    Call MD for:  persistant dizziness or light-headedness   Complete by: As directed    Call MD for:  persistant nausea and vomiting   Complete by: As directed    Call MD for:   severe uncontrolled pain   Complete by: As directed    Call MD for:  temperature >100.4   Complete by: As directed    Diet Carb Modified   Complete by: As directed    Discharge instructions   Complete by: As directed    Please be sure to take your medications as prescribed.  Check your glucose levels 4 times a day, before each meal and at bedtime.  Maintain a log for your primary care provider.  Call them if your levels are greater than 200 consistently.  Your insulin dose may need to be adjusted.  You were cared for by a hospitalist during your hospital stay. If you have any questions about your discharge medications or the care you received while you were in the hospital after you are discharged, you can call the unit and asked to speak with the hospitalist on call if the hospitalist that took care of you is not available. Once you are discharged, your primary care physician will handle any further medical issues. Please note that NO REFILLS for any discharge medications will be authorized once you are discharged, as it is imperative that you return to your primary care physician (or establish a relationship with a primary care physician if you do not have one) for your aftercare needs so that they can reassess your need for medications and monitor your lab values. If you do not have a primary care physician, you can call [phone] for a physician referral.   Increase activity slowly   Complete by: As directed         Allergies as of 07/04/2020      Reactions   Ketoprofen Nausea And Vomiting   Aspirin Nausea Only   Gabapentin Nausea And Vomiting, Other (See Comments)   upset stomach   Ibuprofen Nausea And Vomiting   Liraglutide Nausea And Vomiting   Naproxen Nausea And Vomiting   Omeprazole-sodium Bicarbonate Nausea And Vomiting   Sulfa Antibiotics Nausea And Vomiting   Tramadol Nausea And Vomiting, Other (See Comments)   stomach upset      Medication List    STOP taking these  medications   DULoxetine 60 MG capsule Commonly known as: Cymbalta   oxyCODONE 5 MG  immediate release tablet Commonly known as: Roxicodone     TAKE these medications   albuterol (2.5 MG/3ML) 0.083% nebulizer solution Commonly known as: PROVENTIL Take 3 mLs (2.5 mg total) by nebulization every 6 (six) hours as needed for wheezing or shortness of breath.   albuterol 108 (90 Base) MCG/ACT inhaler Commonly known as: VENTOLIN HFA Inhale 2 puffs into the lungs every 6 (six) hours as needed for wheezing or shortness of breath.   baclofen 10 MG tablet Commonly known as: LIORESAL Take 0.5-1 tablets (5-10 mg total) by mouth 3 (three) times daily as needed for muscle spasms.   blood glucose meter kit and supplies Kit Dispense based on patient and insurance preference. Use up to four times daily as directed. (FOR ICD-9 250.00, 250.01). What changed:   how much to take  how to take this  when to take this   Blood Pressure Kit Devi Check BP BID prn What changed:   how much to take  how to take this  when to take this  reasons to take this  additional instructions   budesonide-formoterol 160-4.5 MCG/ACT inhaler Commonly known as: Symbicort INHALE 2 PUFFS INTO THE LUNGS 2 (TWO) TIMES DAILY. What changed:   how much to take  how to take this  when to take this  additional instructions   cetirizine 10 MG tablet Commonly known as: ZYRTEC Take 1 tablet (10 mg total) by mouth daily.   ferrous sulfate 325 (65 FE) MG tablet Take 1 tablet (325 mg total) by mouth 3 (three) times daily with meals. What changed: when to take this   fluticasone 50 MCG/ACT nasal spray Commonly known as: FLONASE Place 2 sprays into both nostrils daily.   furosemide 40 MG tablet Commonly known as: LASIX Take 1 tablet (40 mg total) by mouth 2 (two) times daily.   Glucosamine Sulfate 1000 MG Caps Take 1 capsule (1,000 mg total) by mouth 2 (two) times daily.   hydrocortisone 2.5 %  cream Apply topically 2 (two) times daily.   hydroquinone 4 % cream Apply topically 2 (two) times daily.   Insulin Lispro Prot & Lispro (75-25) 100 UNIT/ML Kwikpen Commonly known as: HumaLOG Mix 75/25 KwikPen INJECT 110 UNITS EVERY 12 HOURS What changed: additional instructions   losartan 50 MG tablet Commonly known as: COZAAR Take 1 tablet (50 mg total) by mouth daily.   metoCLOPramide 10 MG tablet Commonly known as: REGLAN Take 1 tablet (10 mg total) by mouth 4 (four) times daily -  before meals and at bedtime.   metoprolol succinate 100 MG 24 hr tablet Commonly known as: TOPROL-XL Take 1 tablet (100 mg total) by mouth daily. Take with or immediately following a meal.   norethindrone 5 MG tablet Commonly known as: Aygestin Take 2 tablets (10 mg total) by mouth in the morning, at noon, in the evening, and at bedtime.   omeprazole 40 MG capsule Commonly known as: PRILOSEC TAKE 1 CAPSULE (40 MG TOTAL) BY MOUTH DAILY.   Onglyza 2.5 MG Tabs tablet Generic drug: saxagliptin HCl Take 1 tablet (2.5 mg total) by mouth daily.   Pen Needles 30G X 5 MM Misc 1 Units by Does not apply route as directed.   potassium chloride SA 20 MEQ tablet Commonly known as: KLOR-CON Take 1 tablet (20 mEq total) by mouth daily.   promethazine 12.5 MG tablet Commonly known as: PHENERGAN Take 1 tablet (12.5 mg total) by mouth every 6 (six) hours as needed for nausea or vomiting. What  changed: Another medication with the same name was removed. Continue taking this medication, and follow the directions you see here.   rosuvastatin 5 MG tablet Commonly known as: Crestor Take 1 tablet (5 mg total) by mouth daily.   Spiriva Respimat 2.5 MCG/ACT Aers Generic drug: Tiotropium Bromide Monohydrate Inhale 2 puffs into the lungs daily.   sucralfate 1 GM/10ML suspension Commonly known as: CARAFATE Take 10 mLs (1 g total) by mouth 4 (four) times daily -  with meals and at bedtime.   tiZANidine 2 MG  tablet Commonly known as: ZANAFLEX Take 3 tablets (6 mg total) by mouth every 8 (eight) hours as needed for muscle spasms.   tranexamic acid 650 MG Tabs tablet Commonly known as: LYSTEDA Take 2 tablets (1,300 mg total) by mouth 3 (three) times daily. Take during menses for a maximum of five days   True Metrix Blood Glucose Test test strip Generic drug: glucose blood Use as instructed   True Metrix Meter w/Device Kit 1 each by Does not apply route 4 (four) times daily -  before meals and at bedtime.   TRUEplus Lancets 28G Misc 1 each by Other route in the morning, at noon, in the evening, and at bedtime.   Turmeric 500 MG Caps Take 500 mg by mouth 2 (two) times daily.   Vitamin D (Ergocalciferol) 1.25 MG (50000 UNIT) Caps capsule Commonly known as: DRISDOL Take 1 capsule (50,000 Units total) by mouth every 7 (seven) days. What changed: when to take this   Vitamin D-3 125 MCG (5000 UT) Tabs Take 1 tablet by mouth daily. What changed: how much to take         Follow-up Information    Vevelyn Francois, NP. Schedule an appointment as soon as possible for a visit in 1 week(s).   Specialty: Adult Health Nurse Practitioner Contact information: [address] [place]e [place] [address] [phone]               TOTAL DISCHARGE TIME: 10 mins  [place]  [facility] Pager on [email or web address]  07/04/2020, 2:50 PM

## 2020-07-04 NOTE — Discharge Instructions (Signed)
Diabetic Ketoacidosis °Diabetic ketoacidosis is a serious complication of diabetes. This condition develops when there is not enough insulin in the body. Insulin is an hormone that regulates blood sugar levels in the body. Normally, insulin allows glucose to enter the cells in the body. The cells break down glucose for energy. Without enough insulin, the body cannot break down glucose, so it breaks down fats instead. This leads to high blood glucose levels in the body and the production of acids that are called ketones. Ketones are poisonous at high levels. °If diabetic ketoacidosis is not treated, it can cause severe dehydration and can lead to a coma or death. °What are the causes? °This condition develops when a lack of insulin causes the body to break down fats instead of glucose. This may be triggered by: °· Stress on the body. This stress is brought on by an illness. °· Infection. °· Medicines that raise blood glucose levels. °· Not taking diabetes medicine. °· New onset of type 1 diabetes mellitus. °What are the signs or symptoms? °Symptoms of this condition include: °· Fatigue. °· Weight loss. °· Excessive thirst. °· Light-headedness. °· Fruity or sweet-smelling breath. °· Excessive urination. °· Vision changes. °· Confusion or irritability. °· Nausea. °· Vomiting. °· Rapid breathing. °· Abdominal pain. °· Feeling flushed. °How is this diagnosed? °This condition is diagnosed based on your medical history, a physical exam, and blood tests. You may also have a urine test to check for ketones. °How is this treated? °This condition may be treated with: °· Fluid replacement. This may be done to correct dehydration. °· Insulin injections. These may be given through the skin or through an IV tube. °· Electrolyte replacement. Electrolytes are minerals in your blood. Electrolytes such as potassium and sodium may be given in pill form or through an IV tube. °· Antibiotic medicines. These may be prescribed if your  condition was caused by an infection. °Diabetic ketoacidosis is a serious medical condition. You may need emergency treatment in the hospital to monitor your condition. °Follow these instructions at home: °Eating and drinking °· Drink enough fluids to keep your urine clear or pale yellow. °· If you are not able to eat, drink clear fluids in small amounts as you are able. Clear fluids include water, ice chips, fruit juice with water added (diluted), and low-calorie sports drinks. You may also have sugar-free jello or popsicles. °· If you are able to eat, follow your usual diet and drink sugar-free liquids, such as water. °Medicines °· Take over-the-counter and prescription medicines only as told by your health care provider. °· Continue to take insulin and other diabetes medicines as told by your health care provider. °· If you were prescribed an antibiotic, take it as told by your health care provider. Do not stop taking the antibiotic even if you start to feel better. °General instructions ° °· Check your urine for ketones when you are ill and as told by your health care provider. °? If your blood glucose is 240 mg/dL (13.3 mmol/L) or higher, check your urine ketones every 4-6 hours. °· Check your blood glucose every day, as often as told by your health care provider. °? If your blood glucose is high, drink plenty of fluids. This helps to flush out ketones. °? If your blood glucose is above your target for 2 tests in a row, contact your health care provider. °· Carry a medical alert card or wear medical alert jewelry that says that you have diabetes. °· Rest   and exercise only as told by your health care provider. Do not exercise when your blood glucose is high and you have ketones in your urine. °· If you get sick, call your health care provider and begin treatment quickly. Your body often needs extra insulin to fight an illness. Check your blood glucose every 4-6 hours when you are sick. °· Keep all follow-up  visits as told by your health care provider. This is important. °Contact a health care provider if: °· Your blood glucose level is higher than 240 mg/dL (13.3 mmol/L) for 2 days in a row. °· You have moderate or large ketones in your urine. °· You have a fever. °· You cannot eat or drink without vomiting. °· You have been vomiting for more than 2 hours. °· You continue to have symptoms of diabetic ketoacidosis. °· You develop new symptoms. °Get help right away if: °· Your blood glucose monitor reads “high” even when you are taking insulin. °· You faint. °· You have chest pain. °· You have trouble breathing. °· You have sudden trouble speaking or swallowing. °· You have vomiting or diarrhea that gets worse after 3 hours. °· You are unable to stay awake. °· You have trouble thinking. °· You are severely dehydrated. Symptoms of severe dehydration include: °? Extreme thirst. °? Dry mouth. °? Rapid breathing. °These symptoms may represent a serious problem that is an emergency. Do not wait to see if the symptoms will go away. Get medical help right away. Call your local emergency services (911 in the [place] Do not drive yourself to the hospital. °Summary °· Diabetic ketoacidosis is a serious complication of diabetes. This condition develops when there is not enough insulin in the body. °· This condition is diagnosed based on your medical history, a physical exam, and blood tests. You may also have a urine test to check for ketones. °· Diabetic ketoacidosis is a serious medical condition. You may need emergency treatment in the hospital to monitor your condition. °· Contact your health care provider if your blood glucose is higher than 240 mg/dl for 2 days in a row or if you have moderate or large ketones in your urine. °This information is not intended to replace advice given to you by your health care provider. Make sure you discuss any questions you have with your health care provider. °Document Revised: 11/17/2016  Document Reviewed: 11/06/2016 °Elsevier Patient Education © 2020 [employer] ° °

## 2020-07-04 NOTE — Progress Notes (Signed)
Per MD patient is being d/c today however MD is waiting on lab work to come back. [facility] is not working to get blood work done by [employer], primary RN contacted lab/phlebothomy, they will be here shortly per staff.

## 2020-07-05 MED FILL — METOCLOPRAMIDE 10 MG TABLET: 10 | 30 days supply | Qty: 120 | Fill #0

## 2020-07-05 MED FILL — TRUE METRIX TEST STRIP: 25 days supply | Qty: 100 | Fill #2

## 2020-07-06 ENCOUNTER — Telehealth: Payer: Self-pay | Admitting: Neurology

## 2020-07-06 MED FILL — LOSARTAN POTASSIUM 50 MG TA: 50 | 30 days supply | Qty: 30 | Fill #2

## 2020-07-06 MED FILL — METOPROLOL SUCCINATE ER 100: 100 | 30 days supply | Qty: 30 | Fill #4

## 2020-07-06 NOTE — Telephone Encounter (Signed)
As discussed in the office, I recommend that she follow-up with pain management, since I do not manage chronic pain and options are limited.

## 2020-07-06 NOTE — Telephone Encounter (Signed)
Patient notified

## 2020-07-06 NOTE — Telephone Encounter (Signed)
Its the cymbalta.

## 2020-07-06 NOTE — Telephone Encounter (Signed)
Patient called and said she cannot take the medication Dr. Allena Katz prescribed (chaboxin? Pt not sure of name) because her liver function has improved.

## 2020-07-07 ENCOUNTER — Ambulatory Visit: Payer: Self-pay | Admitting: General Practice

## 2020-07-07 ENCOUNTER — Telehealth: Payer: Self-pay

## 2020-07-07 NOTE — Telephone Encounter (Signed)
Jill Shaw was schedule for surgery on 07/08/2020 but she would like to hold off for the moment. She is requesting to have an MRI done first. I told her that I would forward this message to Dr. Logan Bores for his opinion.

## 2020-07-12 ENCOUNTER — Ambulatory Visit (INDEPENDENT_AMBULATORY_CARE_PROVIDER_SITE_OTHER): Payer: Self-pay | Admitting: Pulmonary Disease

## 2020-07-12 ENCOUNTER — Other Ambulatory Visit: Payer: Self-pay

## 2020-07-12 ENCOUNTER — Encounter: Payer: Self-pay | Admitting: Family Medicine

## 2020-07-12 ENCOUNTER — Ambulatory Visit: Payer: Self-pay | Admitting: Primary Care

## 2020-07-12 ENCOUNTER — Encounter: Payer: Self-pay | Admitting: Pulmonary Disease

## 2020-07-12 VITALS — BP 116/66 | HR 112 | Temp 96.0°F | Ht 64.0 in | Wt 267.8 lb

## 2020-07-12 DIAGNOSIS — E119 Type 2 diabetes mellitus without complications: Secondary | ICD-10-CM

## 2020-07-12 DIAGNOSIS — E1111 Type 2 diabetes mellitus with ketoacidosis with coma: Secondary | ICD-10-CM

## 2020-07-12 DIAGNOSIS — R0609 Other forms of dyspnea: Secondary | ICD-10-CM | POA: Insufficient documentation

## 2020-07-12 DIAGNOSIS — I5032 Chronic diastolic (congestive) heart failure: Secondary | ICD-10-CM

## 2020-07-12 DIAGNOSIS — Z01810 Encounter for preprocedural cardiovascular examination: Secondary | ICD-10-CM | POA: Insufficient documentation

## 2020-07-12 DIAGNOSIS — R0602 Shortness of breath: Secondary | ICD-10-CM

## 2020-07-12 DIAGNOSIS — R0789 Other chest pain: Secondary | ICD-10-CM | POA: Insufficient documentation

## 2020-07-12 DIAGNOSIS — K3184 Gastroparesis: Secondary | ICD-10-CM

## 2020-07-12 DIAGNOSIS — R0600 Dyspnea, unspecified: Secondary | ICD-10-CM | POA: Insufficient documentation

## 2020-07-12 DIAGNOSIS — K219 Gastro-esophageal reflux disease without esophagitis: Secondary | ICD-10-CM

## 2020-07-12 DIAGNOSIS — E1143 Type 2 diabetes mellitus with diabetic autonomic (poly)neuropathy: Secondary | ICD-10-CM

## 2020-07-12 DIAGNOSIS — Z0000 Encounter for general adult medical examination without abnormal findings: Secondary | ICD-10-CM | POA: Insufficient documentation

## 2020-07-12 DIAGNOSIS — F17200 Nicotine dependence, unspecified, uncomplicated: Secondary | ICD-10-CM

## 2020-07-12 DIAGNOSIS — Z794 Long term (current) use of insulin: Secondary | ICD-10-CM

## 2020-07-12 DIAGNOSIS — Z9189 Other specified personal risk factors, not elsewhere classified: Secondary | ICD-10-CM | POA: Insufficient documentation

## 2020-07-12 DIAGNOSIS — J449 Chronic obstructive pulmonary disease, unspecified: Secondary | ICD-10-CM

## 2020-07-12 DIAGNOSIS — R079 Chest pain, unspecified: Secondary | ICD-10-CM | POA: Insufficient documentation

## 2020-07-12 LAB — D-DIMER, QUANTITATIVE: D-Dimer, Quant: 1.24 mcg/mL FEU — ABNORMAL HIGH (ref <0.50)

## 2020-07-12 MED ORDER — CYCLOBENZAPRINE HCL 10 MG PO TABS: 10.0000 mg | ORAL_TABLET | Freq: Three times a day (TID) | ORAL | 3 refills | Status: DC | PRN

## 2020-07-12 MED ORDER — NICOTINE POLACRILEX 4 MG MT LOZG: 4.0000 mg | LOZENGE | OROMUCOSAL | 3 refills | Status: DC | PRN

## 2020-07-12 MED ORDER — NICOTINE 21 MG/24HR TD PT24: 21.0000 mg | MEDICATED_PATCH | Freq: Every day | TRANSDERMAL | 3 refills | Status: DC

## 2020-07-12 MED FILL — CYCLOBENZAPRINE 10 MG TAB: 10 | 30 days supply | Qty: 90 | Fill #0

## 2020-07-12 MED FILL — NICOTINE 21 MG/24HR PATCH: 21 | 28 days supply | Qty: 28 | Fill #0

## 2020-07-12 MED FILL — SM NICOTINE 4 MG LOZENGE: 4 | 72 days supply | Qty: 72 | Fill #0

## 2020-07-12 NOTE — Assessment & Plan Note (Signed)
Plan: Emphasized need to stop smoking Schedule follow-up with primary care Schedule follow-up with cardiology

## 2020-07-12 NOTE — Assessment & Plan Note (Signed)
Plan: Continue Lasix Lab work today Schedule follow-up with cardiology and primary care Continue to weigh yourself regularly

## 2020-07-12 NOTE — Assessment & Plan Note (Signed)
Need to stop smoking Patient currently out of nicotine replacement therapies, has found benefit from nicotine patches in the past  Plan: Emphasized need to stop smoking Patient to utilize reduce to quit method to continue reducing down from 5 cigarettes to 0 Nicotine replacement therapies called in-21 mg patch and 4 mg nicotine lozenge

## 2020-07-12 NOTE — Patient Instructions (Addendum)
You were seen today by Lauraine Rinne, NP  for:   1. Chronic obstructive pulmonary disease, unspecified COPD type ([place]) 2. Shortness of breath  - Comp Met (CMET); Future - B Nat Peptide; Future - CBC with Differential/Platelet; Future - D-Dimer, Quantitative; Future - D-Dimer, Quantitative - CBC with Differential/Platelet - B Nat Peptide - Comp Met (CMET)  Walk today in office  Continue Symbicort 160 >>> 2 puffs in the morning right when you wake up, rinse out your mouth after use, 12 hours later 2 puffs, rinse after use >>> Take this daily, no matter what >>> This is not a rescue inhaler   Continue Spiriva Respimat 2.5 >>> 2 puffs daily >>> Do this every day >>>This is not a rescue inhaler  Note your daily symptoms > remember "red flags" for COPD:   >>>Increase in cough >>>increase in sputum production >>>increase in shortness of breath or activity  intolerance.   If you notice these symptoms, please call the office to be seen.   As emphasized today to extremely important that you stop smoking   3. Tobacco dependence  - nicotine (NICODERM CQ) 21 mg/24hr patch; Place 1 patch (21 mg total) onto the skin daily.  Dispense: 28 patch; Refill: 3 - nicotine polacrilex (COMMIT) 4 MG lozenge; Take 1 lozenge (4 mg total) by mouth as needed for smoking cessation.  Dispense: 108 tablet; Refill: 3  We recommend that you stop smoking.  >>>You need to set a quit date >>>If you have friends or family who smoke, let them know you are trying to quit and not to smoke around you or in your living environment  Smoking Cessation Resources:  1 800 QUIT NOW  >>> Patient to call this resource and utilize it to help support her quit smoking >>> Keep up your hard work with stopping smoking  You can also contact the [facility], [place] >>>For smoking cessation classes call [phone]  We do not recommend using e-cigarettes as a form of stopping smoking  You can sign up for smoking  cessation support texts and information:  >>>https://smokefree.gov/smokefreetxt   Nicotine patches: >>>Make sure you rotate sites that you do not get skin irritation, Apply 1 patch each morning to a non-hairy skin site  If you are smoking greater than 10 cigarettes/day and weigh over 45 kg start with the nicotine patch of 21 mg a day for 6 weeks, then 14 mg a day for 2 weeks, then finished with 7 mg a day for 2 weeks, then stop  If you are smoking less than 10 cigarettes a day or weight less than 45 kg start with medium dose pack of 14 mg a day for 6 weeks, followed by 7 mg a day for 2 weeks   >>>If insomnia occurs you are having trouble sleeping you can take the patch off at night, and place a new one on in the morning >>>If the patch is removed at night and you have morning cravings start short acting nicotine replacement therapy such as gum or lozenges  Nicotine lozenge: Lozenges are commonly uses short acting NRT product  >>>Smokers who smoke within 30 minutes of awakening should use 4 mg dose >>>Smokers who wait more than 30 minutes after awakening to smoke should use 2 mg dose  Can use up to 1 lozenge every 1-2 hours for 6 weeks >>>Total amount of lozenges that can be used per day as 20 >>>Gradually reduce number of lozenges used per day after 2 weeks of  use  Place lozenge in mouth and allowed to dissolve for 30 minutes loss and does not need to be chewed  Lozenges have advantages to be able to be used in people with TMG, poor dentition, dentures   4. Atypical chest pain  - D-Dimer, Quantitative; Future - D-Dimer, Quantitative  Please also schedule follow-up with cardiology Dr. Gwenlyn Found   5. Chronic diastolic CHF (congestive heart failure) (H[facility])  Continue Lasix  We will obtain lab work today  Avoid high sodium foods  Weigh yourself daily  6. Diabetic ketoacidosis with coma associated with type 2 diabetes mellitus ([place]) 7. Type 2 diabetes mellitus without  complication, with long-term current use of insulin ([place]) 8. Diabetic gastroparesis ([place])  Please schedule a primary care visit immediately as you were recently hospitalized for diabetic ketoacidosis  Please eat small portions as you have diabetic gastroparesis and this may be causing worsened GERD  9. Gastroesophageal reflux disease, unspecified whether esophagitis present  Omeprazole 40 mg tablet  >>>Please take 1 tablet daily 15 minutes to 30 minutes before your first meal of the day as well as before your other medications >>>Try to take at the same time each day >>>take this medication daily  GERD management: >>>Avoid laying flat until 2 hours after meals >>>Elevate head of the bed including entire chest >>>Reduce size of meals and amount of fat, acid, spices, caffeine and sweets >>>If you are smoking, Please stop! >>>Decrease alcohol consumption >>>Work on maintaining a healthy weight with normal BMI    10. Healthcare maintenance  Please schedule an immediate follow-up with primary care was recently hospitalized with diabetic ketoacidosis Please schedule a follow-up with cardiology as you are having atypical chest pain We will have you schedule an appointment with a pulmonary sleep doctor as you are at risk for having obstructive sleep apnea We will have you schedule a follow-up with Dr. Valeta Harms  Please notify our office if and when you obtain Medicaid coverage   We recommend today:  Orders Placed This Encounter  Procedures  . Comp Met (CMET)    Standing Status:   Future    Number of Occurrences:   1    Standing Expiration Date:   07/12/2021  . B Nat Peptide    Standing Status:   Future    Number of Occurrences:   1    Standing Expiration Date:   07/12/2021  . CBC with Differential/Platelet    Standing Status:   Future    Number of Occurrences:   1    Standing Expiration Date:   07/12/2021  . D-Dimer, Quantitative    Standing Status:   Future    Number of  Occurrences:   1    Standing Expiration Date:   07/12/2021   Orders Placed This Encounter  Procedures  . Comp Met (CMET)  . B Nat Peptide  . CBC with Differential/Platelet  . D-Dimer, Quantitative   Meds ordered this encounter  Medications  . nicotine (NICODERM CQ) 21 mg/24hr patch    Sig: Place 1 patch (21 mg total) onto the skin daily.    Dispense:  28 patch    Refill:  3  . nicotine polacrilex (COMMIT) 4 MG lozenge    Sig: Take 1 lozenge (4 mg total) by mouth as needed for smoking cessation.    Dispense:  108 tablet    Refill:  3    Follow Up:    Return in about 2 months (around 09/11/2020), or if symptoms worsen or fail  to improve, for Follow up with Dr. Valeta Harms. Please also schedule 52min sleep consult for evaluation of concern for obstructive sleep apnea      Notification of test results are managed in the following manner: If there are  any recommendations or changes to the  plan of care discussed in office today,  we will contact you and let you know what they are. If you do not hear from [place], then your results are normal and you can view them through your  MyChart account , or a letter will be sent to you. Thank you again for trusting [place] with your care  - Thank you, [facility]    It is flu season:   >>> Best ways to protect herself from the flu: Receive the yearly flu vaccine, practice good hand hygiene washing with soap and also using hand sanitizer when available, eat a nutritious meals, get adequate rest, hydrate appropriately       Please contact the office if your symptoms worsen or you have concerns that you are not improving.   Thank you for choosing [facility] for your healthcare, and for allowing [place] to partner with you on your healthcare journey. I am thankful to be able to provide care to you today.   Wyn Quaker FNP-C    Health Risks of Smoking Smoking cigarettes is very bad for your health. Tobacco smoke has over 200 known  poisons in it. It contains the poisonous gases nitrogen oxide and carbon monoxide. There are over 60 chemicals in tobacco smoke that cause cancer. Smoking is difficult to quit because a chemical in tobacco, called nicotine, causes addiction or dependence. When you smoke and inhale, nicotine is absorbed rapidly into the bloodstream through your lungs. Both inhaled and non-inhaled nicotine may be addictive. What are the risks of cigarette smoke? Cigarette smokers have an increased risk of many serious medical problems, including:  Lung cancer.  Lung disease, such as pneumonia, bronchitis, and emphysema.  Chest pain (angina) and heart attack because the heart is not getting enough oxygen.  Heart disease and peripheral blood vessel disease.  High blood pressure (hypertension).  Stroke.  Oral cancer, including cancer of the lip, mouth, or voice box.  Bladder cancer.  Pancreatic cancer.  Cervical cancer.  Pregnancy complications, including premature birth.  Stillbirths and smaller newborn babies, birth defects, and genetic damage to sperm.  Early menopause.  Lower estrogen level for women.  Infertility.  Facial wrinkles.  Blindness.  Increased risk of broken bones (fractures).  Senile dementia.  Stomach ulcers and internal bleeding.  Delayed wound healing and increased risk of complications during surgery.  Even smoking lightly shortens your life expectancy by several years. Because of secondhand smoke exposure, children of smokers have an increased risk of the following:  Sudden infant death syndrome (SIDS).  Respiratory infections.  Lung cancer.  Heart disease.  Ear infections. What are the benefits of quitting? There are many health benefits of quitting smoking. Here are some of them:  Within days of quitting smoking, your risk of having a heart attack decreases, your blood flow improves, and your lung capacity improves. Blood pressure, pulse rate, and  breathing patterns start returning to normal soon after quitting.  Within months, your lungs may clear up completely.  Quitting for 10 years reduces your risk of developing lung cancer and heart disease to almost that of a nonsmoker.  People who quit may see an improvement in their overall quality of life. How do I  quit smoking?     Smoking is an addiction with both physical and psychological effects, and longtime habits can be hard to change. Your health care provider can recommend:  Programs and community resources, which may include group support, education, or talk therapy.  Prescription medicines to help reduce cravings.  Nicotine replacement products, such as patches, gum, and nasal sprays. Use these products only as directed. Do not replace cigarette smoking with electronic cigarettes, which are commonly called e-cigarettes. The safety of e-cigarettes is not known, and some may contain harmful chemicals.  A combination of two or more of these methods. Where to find more information  American Lung Association: www.lung.org  American Cancer Society: www.cancer.org Summary  Smoking cigarettes is very bad for your health. Cigarette smokers have an increased risk of many serious medical problems, including several cancers, heart disease, and stroke.  Smoking is an addiction with both physical and psychological effects, and longtime habits can be hard to change.  By stopping right away, you can greatly reduce the risk of medical problems for you and your family.  To help you quit smoking, your health care provider can recommend programs, community resources, prescription medicines, and nicotine replacement products such as patches, gum, and nasal sprays. This information is not intended to replace advice given to you by your health care provider. Make sure you discuss any questions you have with your health care provider. Document Revised: 01/03/2018 Document Reviewed:  10/06/2016 Elsevier Patient Education  2020 [employer].

## 2020-07-12 NOTE — Assessment & Plan Note (Signed)
Plan: Schedule follow-up with primary care

## 2020-07-12 NOTE — Assessment & Plan Note (Signed)
Suspect atypical chest pain is likely silent reflux given patient's known acid reflux as well as breakthrough reflux symptoms.  Plan: Lab work today Encourage patient to schedule follow-up with cardiology

## 2020-07-12 NOTE — Assessment & Plan Note (Signed)
Recent DKA hospitalization Schedule follow-up with primary care immediately for management of blood sugars

## 2020-07-12 NOTE — Assessment & Plan Note (Signed)
Patient is at risk for obstructive sleep apnea Persistent daytime somnolence Mallampati 3 Cardiology was previously recommended split-night sleep study Patient currently does not have the insurance cover split-night sleep study, she is currently working with [employer] health representatives to apply for Medicaid this status is currently pending  Plan: Schedule follow-up for sleep consult with sleep MD If you obtain Medicaid coverage please contact our office so we can order a split-night sleep study

## 2020-07-12 NOTE — Assessment & Plan Note (Signed)
Follow-up with primary care Continue eating smaller portions

## 2020-07-12 NOTE — Assessment & Plan Note (Signed)
Likely patient shortness of breath is multifactorial given morbid obesity, current smoker status, known COPD, CHF.  With patient's lower extremity pain, tachycardic heart rate as well as sedentary lifestyle she is at risk for VTE events especially given her birth control.  Plan: Lab work today Schedule follow-up with primary care and cardiology Keep follow-up with Dr. Tonia Brooms

## 2020-07-12 NOTE — Progress Notes (Signed)
_0  ID: Jill Shaw, female    DOB: January 01, 1976, 44 y.o.   MRN: [id number]  Chief Complaint  Patient presents with  . Follow-up    sob with coughing and exertion.    Referring provider: Vevelyn Francois, NP  HPI:  44 year old female current everyday smoker followed in our office for COPD  PMH: Hypertension, type 2 diabetes, morbid obesity, chronic kidney disease Smoker/ Smoking History: Current smoker.  4 to 5 cigarettes a day.  13-pack-year smoking history Maintenance: Symbicort 160, Spiriva Respimat 2.5 Pt of: Dr. Valeta Harms  07/12/2020  - Visit   44 year old female current everyday smoker followed in our office for COPD.  She is currently smoking 4 to 5 cigarettes a day.  Patient was recently hospitalized 07/02/2020 and discharged on 07/04/2020 for diabetic ketoacidosis.  She was encouraged to have follow-up with primary care.  Patient presenting to office today reporting continued shortness of breath.  Patient was last seen in our office in June/2021 plan of care at that office visit was for the patient to obtain an echocardiogram to further evaluate symptoms as well as to obtain a split-night sleep study.  She was encouraged to remain on Symbicort 160 and Spiriva Respimat 2.5.  Split-night sleep study does not look like this was ever completed.  Patient still continues to smoke.  She is requesting refills of her nicotine replacement therapies today.  Patient is established with cardiology but has not seen them in quite some time.  She is reporting atypical chest pain today.  We will discuss and evaluate this.  Patient also requested walk in office.  She was walked in office to did not have any oxygen desaturations but continued to have an elevated heart rate as well as symptoms of dyspnea.  Patient denies any history of pulmonary embolism or blood clots in her legs.  She is maintained now on birth control and is not currently taking a blood thinner.  She admits she does have  a sedentary lifestyle and was recently hospitalized.   Questionaires / Pulmonary Flowsheets:   ACT:  No flowsheet data found.  MMRC: mMRC Dyspnea Scale mMRC Score  07/12/2020 3    Epworth:  No flowsheet data found.  Tests:   07/02/2020-chest x-ray-mild cardiomegaly and central vascular congestion similar to prior, some increasing hazy and interstitial opacities towards the lung bases which could reflect edema or atelectasis  03/08/2020-CT angio chest-no pulmonary nodules or masses, no effusion or pneumothorax  04/30/2020-echocardiogram-LV ejection fraction 55 to 60%, moderate concentric left ventricular hypertrophy, and grade 1 diastolic dysfunction  29/52/8413-[id number] function test-FVC 2.31 (77 10/07/2019-pulmonary function test-FVC 2.31 (72% addicted), postbronchodilator ratio 66, postbronchodilator FEV1 1.58 (61% predicted), no bronchodilator response, TLC 4.35 (83% predicted), DLCO 19.24 (85% predicted)  FENO:  No results found for: NITRICOXIDE  PFT: PFT Results Latest Ref Rng & Units 10/07/2019  FVC-Pre L 2.31  FVC-Predicted Pre % 72  FVC-Post L 2.41  FVC-Predicted Post % 76  Pre FEV1/FVC % % 77  Post FEV1/FCV % % 66  FEV1-Pre L 1.78  FEV1-Predicted Pre % 68  FEV1-Post L 1.58  DLCO uncorrected ml/min/mmHg 19.24  DLCO UNC% % 85  DLVA Predicted % 121  TLC L 4.35  TLC % Predicted % 83  RV % Predicted % 109    WALK:  SIX MIN WALK 07/12/2020  Supplimental Oxygen during Test? (L/min) No  Tech Comments: patient walked at average speed, stopped after 2 laps d/t sob.  Sats did not drop below 96% on  RA.    Imaging: CT HEAD WO CONTRAST  Result Date: 07/02/2020 CLINICAL DATA:  Encephalopathy EXAM: CT HEAD WITHOUT CONTRAST TECHNIQUE: Contiguous axial images were obtained from the base of the skull through the vertex without intravenous contrast. COMPARISON:  01/29/2020 FINDINGS: Brain: There is no mass, hemorrhage or extra-axial collection. The size and configuration of  the ventricles and extra-axial CSF spaces are normal. The brain parenchyma is normal, without acute or chronic infarction. Vascular: No abnormal hyperdensity of the major intracranial arteries or dural venous sinuses. No intracranial atherosclerosis. Skull: The visualized skull base, calvarium and extracranial soft tissues are normal. Sinuses/Orbits: No fluid levels or advanced mucosal thickening of the visualized paranasal sinuses. No mastoid or middle ear effusion. The orbits are normal. IMPRESSION: Normal head CT. Electronically Signed   By: Ulyses Jarred M.D.   On: 07/02/2020 03:35   DG Chest Port 1 View  Result Date: 07/02/2020 CLINICAL DATA:  Hyperglycemia, altered mental status EXAM: PORTABLE CHEST 1 VIEW COMPARISON:  Radiograph 07/02/2020 FINDINGS: Mild central vascular congestion is similar to prior. Some increasing hazy and interstitial opacities are present towards the lung bases which could reflect atelectatic change though developing edema could present similarly. No pneumothorax or effusion. Stable cardiomegaly. Remaining cardiomediastinal contours are unremarkable. No acute osseous or soft tissue abnormality. Telemetry leads overlie the chest. IMPRESSION: Mild cardiomegaly and central vascular congestion is similar to prior. Some increasing hazy and interstitial opacities towards the lung bases which could reflect edema or atelectasis. Electronically Signed   By: Lovena Le M.D.   On: 07/02/2020 19:35   DG Chest Port 1 View  Result Date: 07/02/2020 CLINICAL DATA:  Short of breath, altered level of consciousness, hyperglycemia EXAM: PORTABLE CHEST 1 VIEW COMPARISON:  06/22/2020 FINDINGS: Single frontal view of the chest demonstrates stable enlargement of the cardiac silhouette. There is chronic central vascular congestion without airspace disease, effusion, or pneumothorax. No acute bony abnormalities. IMPRESSION: 1. Chronic central vascular congestion. No acute intrathoracic process.  Electronically Signed   By: Randa Ngo M.D.   On: 07/02/2020 00:44   DG Chest Port 1 View  Result Date: 06/22/2020 CLINICAL DATA:  Shortness of breath. EXAM: PORTABLE CHEST 1 VIEW COMPARISON:  CT 03/08/2020.  Chest x-ray 03/08/2020. FINDINGS: Cardiomegaly with diffuse bilateral pulmonary interstitial prominence suggesting CHF. Pneumonitis cannot be excluded. No prominent pleural effusion. No pneumothorax. IMPRESSION: Cardiomegaly with diffuse bilateral pulmonary interstitial prominence suggesting CHF. Pneumonitis cannot be excluded. Electronically Signed   By: Marcello Moores  Register   On: 06/22/2020 06:47   VAS [place] LOWER EXTREMITY VENOUS (DVT) (ONLY [facility] & [facility])  Result Date: 06/22/2020  Lower Venous DVTStudy Indications: Edema.  Comparison Study: No prior studies. Performing Technologist: Darlin Coco  Examination Guidelines: A complete evaluation includes B-mode imaging, spectral Doppler, color Doppler, and power Doppler as needed of all accessible portions of each vessel. Bilateral testing is considered an integral part of a complete examination. Limited examinations for reoccurring indications may be performed as noted. The reflux portion of the exam is performed with the patient in reverse Trendelenburg.  +---------+---------------+---------+-----------+----------+-------------------+ RIGHT    CompressibilityPhasicitySpontaneityPropertiesThrombus Aging      +---------+---------------+---------+-----------+----------+-------------------+ CFV      Full           Yes      Yes                                      +---------+---------------+---------+-----------+----------+-------------------+ SFJ  Full                                                             +---------+---------------+---------+-----------+----------+-------------------+ FV Prox  Full                                                              +---------+---------------+---------+-----------+----------+-------------------+ FV Mid   Full                                                             +---------+---------------+---------+-----------+----------+-------------------+ FV DistalFull                                                             +---------+---------------+---------+-----------+----------+-------------------+ PFV      Full                                                             +---------+---------------+---------+-----------+----------+-------------------+ POP      Full           Yes      Yes                                      +---------+---------------+---------+-----------+----------+-------------------+ PTV      Full                                                             +---------+---------------+---------+-----------+----------+-------------------+ PERO     Full                                         Some segments not                                                         visualized.         +---------+---------------+---------+-----------+----------+-------------------+   +----+---------------+---------+-----------+----------+--------------+ LEFTCompressibilityPhasicitySpontaneityPropertiesThrombus Aging +----+---------------+---------+-----------+----------+--------------+ CFV Full           Yes      Yes                                 +----+---------------+---------+-----------+----------+--------------+  Summary: RIGHT: - There is no evidence of deep vein thrombosis in the lower extremity.  - No cystic structure found in the popliteal fossa.   *See table(s) above for measurements and observations. Electronically signed by Deitra Mayo MD on 06/22/2020 at 12:54:44 PM.    Final     Lab Results:  CBC    Component Value Date/Time   WBC 13.1 (H) 07/04/2020 1209   RBC 4.47 07/04/2020 1209   HGB 9.2 (L) 07/04/2020 1209   HGB 9.2 (L)  06/02/2020 1352   HCT 31.9 (L) 07/04/2020 1209   HCT 32.8 (L) 06/02/2020 1352   PLT 256 07/04/2020 1209   PLT 313 06/02/2020 1352   MCV 71.4 (L) 07/04/2020 1209   MCV 73 (L) 06/02/2020 1352   MCH 20.6 (L) 07/04/2020 1209   MCHC 28.8 (L) 07/04/2020 1209   RDW 24.2 (H) 07/04/2020 1209   RDW 21.1 (H) 06/02/2020 1352   LYMPHSABS 2.0 07/02/2020 0100   LYMPHSABS 2.7 06/02/2020 1352   MONOABS 0.8 07/02/2020 0100   EOSABS 0.0 07/02/2020 0100   EOSABS 0.5 (H) 06/02/2020 1352   BASOSABS 0.1 07/02/2020 0100   BASOSABS 0.1 06/02/2020 1352    BMET    Component Value Date/Time   NA 136 07/04/2020 1209   NA 140 06/02/2020 1352   K 4.7 07/04/2020 1209   CL 104 07/04/2020 1209   CO2 21 (L) 07/04/2020 1209   GLUCOSE 370 (H) 07/04/2020 1209   BUN 10 07/04/2020 1209   BUN 4 (L) 06/02/2020 1352   CREATININE 1.16 (H) 07/04/2020 1209   CREATININE 1.18 (H) 08/31/2017 1621   CALCIUM 9.2 07/04/2020 1209   GFRNONAA 58 (L) 07/04/2020 1209   GFRNONAA 58 (L) 08/31/2017 1621   GFRAA >60 07/04/2020 1209   GFRAA 67 08/31/2017 1621    BNP    Component Value Date/Time   BNP 266.3 (H) 06/22/2020 0636   BNP 67.5 03/30/2017 1438    ProBNP No results found for: PROBNP  Specialty Problems      Pulmonary Problems   Chronic obstructive pulmonary disease (HCC)    COPD      Shortness of breath      Allergies  Allergen Reactions  . Ketoprofen Nausea And Vomiting  . Aspirin Nausea Only  . Gabapentin Nausea And Vomiting and Other (See Comments)    upset stomach  . Ibuprofen Nausea And Vomiting  . Liraglutide Nausea And Vomiting  . Naproxen Nausea And Vomiting  . Omeprazole-Sodium Bicarbonate Nausea And Vomiting  . Sulfa Antibiotics Nausea And Vomiting  . Tramadol Nausea And Vomiting and Other (See Comments)    stomach upset    Immunization History  Administered Date(s) Administered  . Influenza,inj,Quad PF,6+ Mos 09/03/2018, 07/03/2019, 07/04/2020  . Pneumococcal Polysaccharide-23  03/28/2015, 07/04/2020  . Tdap 03/27/2016    Past Medical History:  Diagnosis Date  . Anemia   . Arthritis    knees, hands  . Asthma   . Chronic diastolic (congestive) heart failure ([place])   . COPD (chronic obstructive pulmonary disease) ([place])   . Diabetes mellitus without complication ([place])    type 2  . Dysfunctional uterine bleeding   . GERD (gastroesophageal reflux disease)   . Hypertension   . Neuromuscular disorder (HCC)    neuropathy feet  . Seizures ([place]) 09/12/2017   pt states r/t stress and blood sugar - no meds last one 4 months ago, not seen neurologist  . Sickle cell trait ([place])   . Smoker   .  Vitamin D deficiency 10/2019  . Wears glasses     Tobacco History: Social History   Tobacco Use  Smoking Status Current Every Day Smoker  . Packs/day: 0.50  . Years: 26.00  . Pack years: 13.00  . Types: Cigarettes  Smokeless Tobacco Never Used  Tobacco Comment   4-5 cigarettes/day   Ready to quit: No Counseling given: Yes Comment: 4-5 cigarettes/day   Smoking assessment and cessation counseling  Patient currently smoking: 4-5 cigarettes a day  I have advised the patient to quit/stop smoking as soon as possible due to high risk for multiple medical problems.  It will also be very difficult for [place] to manage patient's  respiratory symptoms and status if we continue to expose her lungs to a known irritant.  We do not advise e-cigarettes as a form of stopping smoking.  Patient is  willing to quit smoking. Was using NRT.   I have advised the patient that we can assist and have options of nicotine replacement therapy, provided smoking cessation education today, provided smoking cessation counseling, and provided cessation resources.  Follow-up next office visit office visit for assessment of smoking cessation.    Smoking cessation counseling advised for: 4 min    Outpatient Encounter Medications as of 07/12/2020  Medication Sig  . albuterol (PROVENTIL) (2.5  MG/3ML) 0.083% nebulizer solution Take 3 mLs (2.5 mg total) by nebulization every 6 (six) hours as needed for wheezing or shortness of breath.  Marland Kitchen albuterol (VENTOLIN HFA) 108 (90 Base) MCG/ACT inhaler Inhale 2 puffs into the lungs every 6 (six) hours as needed for wheezing or shortness of breath.  . baclofen (LIORESAL) 10 MG tablet Take 0.5-1 tablets (5-10 mg total) by mouth 3 (three) times daily as needed for muscle spasms.  . blood glucose meter kit and supplies KIT Dispense based on patient and insurance preference. Use up to four times daily as directed. (FOR ICD-9 250.00, 250.01). (Patient taking differently: 1 each by Other route See admin instructions. Dispense based on patient and insurance preference. Use up to four times daily as directed. (FOR ICD-9 250.00, 250.01).)  . Blood Glucose Monitoring Suppl (TRUE METRIX METER) w/Device KIT 1 each by Does not apply route 4 (four) times daily -  before meals and at bedtime.  . Blood Pressure Monitoring (BLOOD PRESSURE KIT) DEVI Check BP BID prn (Patient taking differently: 1 each by Other route 2 (two) times daily as needed (blood pressure). )  . budesonide-formoterol (SYMBICORT) 160-4.5 MCG/ACT inhaler INHALE 2 PUFFS INTO THE LUNGS 2 (TWO) TIMES DAILY. (Patient taking differently: Inhale 2 puffs into the lungs 2 (two) times daily. )  . cetirizine (ZYRTEC) 10 MG tablet Take 1 tablet (10 mg total) by mouth daily.  . Cholecalciferol (VITAMIN D-3) 125 MCG (5000 UT) TABS Take 1 tablet by mouth daily. (Patient taking differently: Take 5,000 Units by mouth daily. )  . cyclobenzaprine (FLEXERIL) 10 MG tablet Take 1 tablet (10 mg total) by mouth 3 (three) times daily as needed for muscle spasms.  . ferrous sulfate 325 (65 FE) MG tablet Take 1 tablet (325 mg total) by mouth 3 (three) times daily with meals. (Patient taking differently: Take 325 mg by mouth 2 (two) times daily with a meal. )  . fluticasone (FLONASE) 50 MCG/ACT nasal spray Place 2 sprays into both  nostrils daily.  . furosemide (LASIX) 40 MG tablet Take 1 tablet (40 mg total) by mouth 2 (two) times daily.  . Glucosamine Sulfate 1000 MG CAPS Take 1 capsule (  1,000 mg total) by mouth 2 (two) times daily.  Marland Kitchen glucose blood (TRUE METRIX BLOOD GLUCOSE TEST) test strip Use as instructed  . hydrocortisone 2.5 % cream Apply topically 2 (two) times daily.  . hydroquinone 4 % cream Apply topically 2 (two) times daily.  . Insulin Lispro Prot & Lispro (HUMALOG MIX 75/25 KWIKPEN) (75-25) 100 UNIT/ML Kwikpen INJECT 110 UNITS EVERY 12 HOURS  . Insulin Pen Needle (PEN NEEDLES) 30G X 5 MM MISC 1 Units by Does not apply route as directed.  . metoCLOPramide (REGLAN) 10 MG tablet Take 1 tablet (10 mg total) by mouth 4 (four) times daily -  before meals and at bedtime.  . metoprolol succinate (TOPROL-XL) 100 MG 24 hr tablet Take 1 tablet (100 mg total) by mouth daily. Take with or immediately following a meal.  . norethindrone (AYGESTIN) 5 MG tablet Take 2 tablets (10 mg total) by mouth in the morning, at noon, in the evening, and at bedtime.  Marland Kitchen omeprazole (PRILOSEC) 40 MG capsule TAKE 1 CAPSULE (40 MG TOTAL) BY MOUTH DAILY.  Marland Kitchen potassium chloride SA (KLOR-CON) 20 MEQ tablet Take 1 tablet (20 mEq total) by mouth daily.  . promethazine (PHENERGAN) 12.5 MG tablet Take 1 tablet (12.5 mg total) by mouth every 6 (six) hours as needed for nausea or vomiting.  . rosuvastatin (CRESTOR) 5 MG tablet Take 1 tablet (5 mg total) by mouth daily.  . saxagliptin HCl (ONGLYZA) 2.5 MG TABS tablet Take 1 tablet (2.5 mg total) by mouth daily.  . sucralfate (CARAFATE) 1 GM/10ML suspension Take 10 mLs (1 g total) by mouth 4 (four) times daily -  with meals and at bedtime.  . Tiotropium Bromide Monohydrate (SPIRIVA RESPIMAT) 2.5 MCG/ACT AERS Inhale 2 puffs into the lungs daily.  Marland Kitchen tiZANidine (ZANAFLEX) 2 MG tablet Take 3 tablets (6 mg total) by mouth every 8 (eight) hours as needed for muscle spasms.  . tranexamic acid (LYSTEDA) 650 MG  TABS tablet Take 2 tablets (1,300 mg total) by mouth 3 (three) times daily. Take during menses for a maximum of five days  . TRUEplus Lancets 28G MISC 1 each by Other route in the morning, at noon, in the evening, and at bedtime.  . Turmeric 500 MG CAPS Take 500 mg by mouth 2 (two) times daily.  . Vitamin D, Ergocalciferol, (DRISDOL) 1.25 MG (50000 UNIT) CAPS capsule Take 1 capsule (50,000 Units total) by mouth every 7 (seven) days. (Patient taking differently: Take 50,000 Units by mouth every Friday. )  . losartan (COZAAR) 50 MG tablet Take 1 tablet (50 mg total) by mouth daily.  . nicotine (NICODERM CQ) 21 mg/24hr patch Place 1 patch (21 mg total) onto the skin daily.  . nicotine polacrilex (COMMIT) 4 MG lozenge Take 1 lozenge (4 mg total) by mouth as needed for smoking cessation.  . [DISCONTINUED] hydrALAZINE (APRESOLINE) 50 MG tablet Take 1 tablet (50 mg total) by mouth 3 (three) times daily.   No facility-administered encounter medications on file as of 07/12/2020.     Review of Systems  Review of Systems  Constitutional: Positive for fatigue. Negative for activity change and fever.  HENT: Negative for sinus pressure, sinus pain and sore throat.   Respiratory: Positive for shortness of breath. Negative for cough and wheezing.   Cardiovascular: Positive for chest pain and palpitations. Negative for leg swelling.  Gastrointestinal: Negative for diarrhea, nausea and vomiting.  Musculoskeletal: Negative for arthralgias.  Neurological: Positive for dizziness, weakness and headaches.  Psychiatric/Behavioral: Negative for  sleep disturbance. The patient is not nervous/anxious.      Physical Exam  BP 116/66 (BP Location: Left Arm, Cuff Size: Large)   Pulse (!) 112   Temp (!) 96 F (35.6 C)   Ht _0  (1.626 m)   Wt 267 lb 12.8 oz (121.5 kg)   SpO2 100%   BMI 45.97 kg/m   Wt Readings from Last 5 Encounters:  07/12/20 267 lb 12.8 oz (121.5 kg)  07/04/20 262 lb 12.6 oz (119.2 kg)    06/28/20 261 lb (118.4 kg)  06/25/20 268 lb 1.6 oz (121.6 kg)  06/02/20 277 lb (125.6 kg)    BMI Readings from Last 5 Encounters:  07/12/20 45.97 kg/m  07/04/20 45.11 kg/m  06/28/20 44.80 kg/m  06/25/20 46.02 kg/m  06/02/20 46.10 kg/m     Physical Exam Vitals and nursing note reviewed.  Constitutional:      General: She is not in acute distress.    Appearance: Normal appearance. She is obese.  HENT:     Head: Normocephalic and atraumatic.     Right Ear: Tympanic membrane, ear canal and external ear normal. There is no impacted cerumen.     Left Ear: Tympanic membrane, ear canal and external ear normal. There is no impacted cerumen.     Nose: Rhinorrhea present. No congestion.     Mouth/Throat:     Mouth: Mucous membranes are moist.     Pharynx: Oropharynx is clear.     Comments: mallampati 3 Eyes:     Pupils: Pupils are equal, round, and reactive to light.  Cardiovascular:     Rate and Rhythm: Regular rhythm. Tachycardia present.     Pulses: Normal pulses.     Heart sounds: Normal heart sounds. No murmur heard.      Comments: Distant heart tones Pulmonary:     Effort: Pulmonary effort is normal. No respiratory distress.     Breath sounds: No decreased air movement. No decreased breath sounds, wheezing or rales.     Comments: Diminished breath sounds throughout exam Abdominal:     General: Abdomen is flat. Bowel sounds are normal.     Palpations: Abdomen is soft.  Musculoskeletal:     Cervical back: Normal range of motion.     Right lower leg: Tenderness present. 1+ Edema present.     Left lower leg: 1+ Edema present.  Skin:    General: Skin is warm and dry.     Capillary Refill: Capillary refill takes less than 2 seconds.  Neurological:     General: No focal deficit present.     Mental Status: She is alert and oriented to person, place, and time. Mental status is at baseline.     Gait: Gait normal.  Psychiatric:        Mood and Affect: Mood normal.         Behavior: Behavior normal.        Thought Content: Thought content normal.        Judgment: Judgment normal.       Assessment & Plan:   Chronic diastolic CHF (congestive heart failure) (HCC) Plan: Continue Lasix Lab work today Schedule follow-up with cardiology and primary care Continue to weigh yourself regularly  Chronic obstructive pulmonary disease (HCC) PFTs in December/2020 showed moderate obstruction with evidence of air trapping Currently maintained on Symbicort 160 and Spiriva Respimat 2.5  Plan: Walk today in office Continue Symbicort 160 and Spiriva Respimat 2.5 39-monthfollow-up with Dr. IValeta HarmsEmphasized need for  the patient to stop smoking  Gastroesophageal reflux disease Plan: Continue PPI Schedule follow-up with primary care With patient's known gastroparesis she is at higher risk of having worsened acid reflux This may be a component of her atypical chest pain  Diabetic gastroparesis ([place]) Follow-up with primary care Continue eating smaller portions  Type 2 diabetes mellitus without complication, with long-term current use of insulin ([place]) Recent DKA hospitalization Schedule follow-up with primary care immediately for management of blood sugars  DKA (diabetic ketoacidoses) (HCC) Plan: Schedule follow-up with primary care  Tobacco dependence Need to stop smoking Patient currently out of nicotine replacement therapies, has found benefit from nicotine patches in the past  Plan: Emphasized need to stop smoking Patient to utilize reduce to quit method to continue reducing down from 5 cigarettes to 0 Nicotine replacement therapies called in-21 mg patch and 4 mg nicotine lozenge  Shortness of breath Likely patient shortness of breath is multifactorial given morbid obesity, current smoker status, known COPD, CHF.  With patient's lower extremity pain, tachycardic heart rate as well as sedentary lifestyle she is at risk for VTE events especially given  her birth control.  Plan: Lab work today Schedule follow-up with primary care and cardiology Keep follow-up with Dr. Valeta Harms  Healthcare maintenance Plan: Emphasized need to stop smoking Schedule follow-up with primary care Schedule follow-up with cardiology   Atypical chest pain Suspect atypical chest pain is likely silent reflux given patient's known acid reflux as well as breakthrough reflux symptoms.  Plan: Lab work today Encourage patient to schedule follow-up with cardiology  At risk for obstructive sleep apnea Patient is at risk for obstructive sleep apnea Persistent daytime somnolence Mallampati 3 Cardiology was previously recommended split-night sleep study Patient currently does not have the insurance cover split-night sleep study, she is currently working with [employer] health representatives to apply for Medicaid this status is currently pending  Plan: Schedule follow-up for sleep consult with sleep MD If you obtain Medicaid coverage please contact our office so we can order a split-night sleep study    Return in about 2 months (around 09/11/2020), or if symptoms worsen or fail to improve, for Follow up with Dr. Valeta Harms.   Lauraine Rinne, NP 07/12/2020   This appointment required 55 minutes of patient care (this includes precharting, chart review, review of results, face-to-face care, etc.).

## 2020-07-12 NOTE — Assessment & Plan Note (Signed)
Plan: Continue PPI Schedule follow-up with primary care With patient's known gastroparesis she is at higher risk of having worsened acid reflux This may be a component of her atypical chest pain

## 2020-07-12 NOTE — Assessment & Plan Note (Signed)
PFTs in December/2020 showed moderate obstruction with evidence of air trapping Currently maintained on Symbicort 160 and Spiriva Respimat 2.5  Plan: Walk today in office Continue Symbicort 160 and Spiriva Respimat 2.5 2-month follow-up with Dr. Tonia Brooms Emphasized need for the patient to stop smoking

## 2020-07-13 ENCOUNTER — Telehealth (INDEPENDENT_AMBULATORY_CARE_PROVIDER_SITE_OTHER): Payer: Self-pay | Admitting: Cardiology

## 2020-07-13 ENCOUNTER — Telehealth: Payer: Self-pay | Admitting: Pulmonary Disease

## 2020-07-13 ENCOUNTER — Telehealth: Payer: Self-pay | Admitting: Cardiology

## 2020-07-13 ENCOUNTER — Inpatient Hospital Stay: Admission: RE | Admit: 2020-07-13 | Payer: Self-pay | Source: Ambulatory Visit

## 2020-07-13 ENCOUNTER — Encounter (HOSPITAL_COMMUNITY): Payer: Self-pay

## 2020-07-13 ENCOUNTER — Other Ambulatory Visit: Payer: Self-pay | Admitting: Pulmonary Disease

## 2020-07-13 ENCOUNTER — Encounter: Payer: Self-pay | Admitting: Cardiology

## 2020-07-13 VITALS — BP 140/73 | HR 110 | Ht 64.0 in | Wt 270.0 lb

## 2020-07-13 DIAGNOSIS — F17200 Nicotine dependence, unspecified, uncomplicated: Secondary | ICD-10-CM

## 2020-07-13 DIAGNOSIS — I5032 Chronic diastolic (congestive) heart failure: Secondary | ICD-10-CM

## 2020-07-13 DIAGNOSIS — R0602 Shortness of breath: Secondary | ICD-10-CM

## 2020-07-13 DIAGNOSIS — G8929 Other chronic pain: Secondary | ICD-10-CM

## 2020-07-13 DIAGNOSIS — R079 Chest pain, unspecified: Secondary | ICD-10-CM

## 2020-07-13 DIAGNOSIS — Z794 Long term (current) use of insulin: Secondary | ICD-10-CM

## 2020-07-13 DIAGNOSIS — J449 Chronic obstructive pulmonary disease, unspecified: Secondary | ICD-10-CM

## 2020-07-13 DIAGNOSIS — R7989 Other specified abnormal findings of blood chemistry: Secondary | ICD-10-CM | POA: Insufficient documentation

## 2020-07-13 DIAGNOSIS — E1111 Type 2 diabetes mellitus with ketoacidosis with coma: Secondary | ICD-10-CM

## 2020-07-13 DIAGNOSIS — E119 Type 2 diabetes mellitus without complications: Secondary | ICD-10-CM

## 2020-07-13 DIAGNOSIS — Z9189 Other specified personal risk factors, not elsewhere classified: Secondary | ICD-10-CM

## 2020-07-13 DIAGNOSIS — I10 Essential (primary) hypertension: Secondary | ICD-10-CM

## 2020-07-13 LAB — CBC WITH DIFFERENTIAL/PLATELET
Basophils Absolute: 0.1 10*3/uL (ref 0.0–0.1)
Basophils Relative: 0.7 % (ref 0.0–3.0)
Eosinophils Absolute: 0.4 10*3/uL (ref 0.0–0.7)
Eosinophils Relative: 3.3 % (ref 0.0–5.0)
HCT: 30 % — ABNORMAL LOW (ref 36.0–46.0)
Hemoglobin: 9.1 g/dL — ABNORMAL LOW (ref 12.0–15.0)
Lymphocytes Relative: 18.8 % (ref 12.0–46.0)
Lymphs Abs: 2.5 10*3/uL (ref 0.7–4.0)
MCHC: 30.2 g/dL (ref 30.0–36.0)
MCV: 67.4 fl — ABNORMAL LOW (ref 78.0–100.0)
Monocytes Absolute: 0.8 10*3/uL (ref 0.1–1.0)
Monocytes Relative: 6.2 % (ref 3.0–12.0)
Neutro Abs: 9.5 10*3/uL — ABNORMAL HIGH (ref 1.4–7.7)
Neutrophils Relative %: 71 % (ref 43.0–77.0)
Platelets: 366 10*3/uL (ref 150.0–400.0)
RBC: 4.46 Mil/uL (ref 3.87–5.11)
RDW: 24.8 % — ABNORMAL HIGH (ref 11.5–15.5)
WBC: 13.4 10*3/uL — ABNORMAL HIGH (ref 4.0–10.5)

## 2020-07-13 LAB — COMPREHENSIVE METABOLIC PANEL
ALT: 10 U/L (ref 0–35)
AST: 26 U/L (ref 0–37)
Albumin: 4 g/dL (ref 3.5–5.2)
Alkaline Phosphatase: 70 U/L (ref 39–117)
BUN: 14 mg/dL (ref 6–23)
CO2: 27 mEq/L (ref 19–32)
Calcium: 8.6 mg/dL (ref 8.4–10.5)
Chloride: 101 mEq/L (ref 96–112)
Creatinine, Ser: 1.23 mg/dL — ABNORMAL HIGH (ref 0.40–1.20)
GFR: 57.5 mL/min — ABNORMAL LOW (ref >60.00)
Glucose, Bld: 275 mg/dL — ABNORMAL HIGH (ref 70–99)
Potassium: 3.6 mEq/L (ref 3.5–5.1)
Sodium: 136 mEq/L (ref 135–145)
Total Bilirubin: 0.4 mg/dL (ref 0.2–1.2)
Total Protein: 7.2 g/dL (ref 6.0–8.3)

## 2020-07-13 LAB — BRAIN NATRIURETIC PEPTIDE: Pro B Natriuretic peptide (BNP): 57 pg/mL (ref 0.0–100.0)

## 2020-07-13 NOTE — Telephone Encounter (Signed)
Patient says her [facility] ends on 07/29/2020 and she needed to f/u before that date.

## 2020-07-13 NOTE — Patient Instructions (Signed)
Medication Instructions:  Continue  Current medications  *If you need a refill on your cardiac medications before your next appointment, please call your pharmacy*   Lab Work: None Ordered   Testing/Procedures: None Ordered   Follow-Up: At [facility], you and your health needs are our priority.  As part of our continuing mission to provide you with exceptional heart care, we have created designated Provider Care Teams.  These Care Teams include your primary Cardiologist (physician) and Advanced Practice Providers (APPs -  Physician Assistants and Nurse Practitioners) who all work together to provide you with the care you need, when you need it.  We recommend signing up for the patient portal called "MyChart".  Sign up information is provided on this After Visit Summary.  MyChart is used to connect with patients for Virtual Visits (Telemedicine).  Patients are able to view lab/test results, encounter notes, upcoming appointments, etc.  Non-urgent messages can be sent to your provider as well.   To learn more about what you can do with MyChart, go to [email or web address].    Your next appointment:   October 21st @ 3:15 pm  The format for your next appointment:   In Person  Provider:   You will see one of the following Advanced Practice Providers on your designated Care Team:    Corine Shelter, [place]

## 2020-07-13 NOTE — Telephone Encounter (Signed)
New message:     Patient calling to speak with her concerning a apt. Patient has some issues. please call back.

## 2020-07-13 NOTE — Progress Notes (Signed)
07/13/2020  I have placed order for stat CTA chest as well as lower extremity Dopplers to further evaluate patient's shortness of breath, leg pain and elevated D-dimer.  Elisha Headland, FNP

## 2020-07-13 NOTE — Progress Notes (Signed)
Virtual Visit via Telephone Note   This visit type was conducted due to national recommendations for restrictions regarding the COVID-19 Pandemic (e.g. social distancing) in an effort to limit this patient's exposure and mitigate transmission in our community.  Due to her co-morbid illnesses, this patient is at least at moderate risk for complications without adequate follow up.  This format is felt to be most appropriate for this patient at this time.  The patient did not have access to video technology/had technical difficulties with video requiring transitioning to audio format only (telephone).  All issues noted in this document were discussed and addressed.  No physical exam could be performed with this format.  Please refer to the patient's chart for her  consent to telehealth for [facility]    Date:  07/13/2020   ID:  Jill Shaw, DOB 03/31/1976, MRN [id number] The patient was identified using 2 identifiers.  Patient Location: Home Provider Location: Office/Clinic  PCP:  Vevelyn Francois, NP  Cardiologist:  Quay Burow, MD  Electrophysiologist:  None   Evaluation Performed:  Follow-Up Visit  Chief Complaint:  Post hospital follow up  History of Present Illness:    Jill Shaw is a pleasant 44 y.o. female with a history of type 2 insulin-dependent diabetes, chronic diastolic heart failure, chronic pain syndrome, asthmatic COPD, and morbid obesity with a BMI of 45.  She was admitted 9/7 to 06/25/2020 with acute on chronic diastolic heart failure.  Her discharge weight on 910 was 268 pounds.  She was not weighed on admission.  She was discharged on furosemide 40 mg twice a day.  She had about a 2 L diuresis.  She returned to the emergency room 07/02/2020 with mental status changes.  Her blood sugar was 950.  She had acute kidney injury with a creatinine of 1.87.  She reportedly had had some nausea and vomiting as an outpatient.  Patient was admitted and treated by the  internal medicine service, cardiology did not see her in consult.  She improved with hydration and control of her blood sugar.  At discharge 07/04/2020 her creatinine was 1.16, her weight 267 pounds.  She was discharged on furosemide 40 mg twice a day.  On 07/12/2020 she walked into the pulmonary office complaining of shortness of breath and chest discomfort.  Her weight was up to 270 pounds.  It was felt her symptoms were multifactorial secondary to morbid obesity, known smoking, COPD, and chronic diastolic heart failure.  She did have some lower extremity pain and baseline sinus tachycardia with a rate of 110.  A D-dimer was obtained and was elevated at 1.24.  The patient was contacted today for virtual post hospital follow-up.  On the phone she did not appear to be in any distress.  She seemed a little confused about her Lasix dose.  It sounds like she was taking extra Lasix.  She also tells me that she is going for a CT scan to rule out PE, and for lower extremity Dopplers today.  Vital signs show that she remains tachycardic, when she was discharged from the hospital her heart rate was in the 80s.  I confirmed with her that she is on her beta-blocker, apparently there was some question about whether she was taking that or not prior to her last admission.  The patient does not have symptoms concerning for COVID-19 infection (fever, chills, cough, or new shortness of breath).    Past Medical History:  Diagnosis Date  .  Anemia   . Arthritis    knees, hands  . Asthma   . Chronic diastolic (congestive) heart failure ([place])   . COPD (chronic obstructive pulmonary disease) ([place])   . Diabetes mellitus without complication ([place])    type 2  . Dysfunctional uterine bleeding   . GERD (gastroesophageal reflux disease)   . Hypertension   . Neuromuscular disorder ([facility])    neuropathy feet  . Seizures ([place]) 09/12/2017   pt states r/t stress and blood sugar - no meds last one 4 months ago, not seen  neurologist  . Sickle cell trait ([place])   . Smoker   . Vitamin D deficiency 10/2019  . Wears glasses    Past Surgical History:  Procedure Laterality Date  . CESAREAN SECTION     x 1. for twins  . DILATION AND CURETTAGE OF UTERUS N/A 08/20/2019   Procedure: DILATATION AND CURETTAGE;  Surgeon: Emily Filbert, MD;  Location: [facility];  Service: Gynecology;  Laterality: N/A;  . ENDOMETRIAL ABLATION N/A 08/20/2019   Procedure: [place] Ablation;  Surgeon: Emily Filbert, MD;  Location: [facility];  Service: Gynecology;  Laterality: N/A;  . EYE SURGERY Bilateral    laser right and cataract removed left eye  . RADIOLOGY WITH ANESTHESIA N/A 09/16/2019   Procedure: MRI WITH ANESTHESIA   L SPINE WITHOUT CONTRAST, T SPINE WITHOUT CONTRAST , CERVICAL WITHOUT CONTRAST;  Surgeon: Radiologist, Medication, MD;  Location: [facility];  Service: Radiology;  Laterality: N/A;  . TUBAL LIGATION     interval BTL  . UPPER GI ENDOSCOPY  07/2017     Current Meds  Medication Sig  . albuterol (PROVENTIL) (2.5 MG/3ML) 0.083% nebulizer solution Take 3 mLs (2.5 mg total) by nebulization every 6 (six) hours as needed for wheezing or shortness of breath.  Marland Kitchen albuterol (VENTOLIN HFA) 108 (90 Base) MCG/ACT inhaler Inhale 2 puffs into the lungs every 6 (six) hours as needed for wheezing or shortness of breath.  . blood glucose meter kit and supplies KIT Dispense based on patient and insurance preference. Use up to four times daily as directed. (FOR ICD-9 250.00, 250.01). (Patient taking differently: 1 each by Other route See admin instructions. Dispense based on patient and insurance preference. Use up to four times daily as directed. (FOR ICD-9 250.00, 250.01).)  . Blood Glucose Monitoring Suppl (TRUE METRIX METER) w/Device KIT 1 each by Does not apply route 4 (four) times daily -  before meals and at bedtime.  . Blood Pressure Monitoring (BLOOD PRESSURE KIT) DEVI Check BP BID prn (Patient taking differently: 1 each by Other route 2 (two) times  daily as needed (blood pressure). )  . budesonide-formoterol (SYMBICORT) 160-4.5 MCG/ACT inhaler INHALE 2 PUFFS INTO THE LUNGS 2 (TWO) TIMES DAILY. (Patient taking differently: Inhale 2 puffs into the lungs 2 (two) times daily. )  . cetirizine (ZYRTEC) 10 MG tablet Take 1 tablet (10 mg total) by mouth daily.  . Cholecalciferol (VITAMIN D-3) 125 MCG (5000 UT) TABS Take 1 tablet by mouth daily. (Patient taking differently: Take 5,000 Units by mouth daily. )  . cyclobenzaprine (FLEXERIL) 10 MG tablet Take 1 tablet (10 mg total) by mouth 3 (three) times daily as needed for muscle spasms.  . diclofenac (VOLTAREN) 75 MG EC tablet Take 75 mg by mouth 2 (two) times daily as needed.  . ferrous sulfate 325 (65 FE) MG tablet Take 1 tablet (325 mg total) by mouth 3 (three) times daily with meals. (Patient taking differently: Take 325  mg by mouth 2 (two) times daily with a meal. )  . fluticasone (FLONASE) 50 MCG/ACT nasal spray Place 2 sprays into both nostrils daily.  . furosemide (LASIX) 40 MG tablet Take 1 tablet (40 mg total) by mouth 2 (two) times daily.  . Glucosamine Sulfate 1000 MG CAPS Take 1 capsule (1,000 mg total) by mouth 2 (two) times daily.  Marland Kitchen glucose blood (TRUE METRIX BLOOD GLUCOSE TEST) test strip Use as instructed  . hydrocortisone 2.5 % cream Apply topically 2 (two) times daily.  . hydroquinone 4 % cream Apply topically 2 (two) times daily.  . Insulin Lispro Prot & Lispro (HUMALOG MIX 75/25 KWIKPEN) (75-25) 100 UNIT/ML Kwikpen INJECT 110 UNITS EVERY 12 HOURS  . Insulin Pen Needle (PEN NEEDLES) 30G X 5 MM MISC 1 Units by Does not apply route as directed.  Marland Kitchen losartan (COZAAR) 50 MG tablet Take 1 tablet (50 mg total) by mouth daily.  . metoCLOPramide (REGLAN) 10 MG tablet Take 1 tablet (10 mg total) by mouth 4 (four) times daily -  before meals and at bedtime.  . metoprolol succinate (TOPROL-XL) 100 MG 24 hr tablet Take 1 tablet (100 mg total) by mouth daily. Take with or immediately following a  meal.  . nicotine (NICODERM CQ) 21 mg/24hr patch Place 1 patch (21 mg total) onto the skin daily.  . nicotine polacrilex (COMMIT) 4 MG lozenge Take 1 lozenge (4 mg total) by mouth as needed for smoking cessation.  . norethindrone (AYGESTIN) 5 MG tablet Take 2 tablets (10 mg total) by mouth in the morning, at noon, in the evening, and at bedtime.  Marland Kitchen omeprazole (PRILOSEC) 40 MG capsule TAKE 1 CAPSULE (40 MG TOTAL) BY MOUTH DAILY.  Marland Kitchen potassium chloride SA (KLOR-CON) 20 MEQ tablet Take 1 tablet (20 mEq total) by mouth daily.  . promethazine (PHENERGAN) 12.5 MG tablet Take 1 tablet (12.5 mg total) by mouth every 6 (six) hours as needed for nausea or vomiting.  . rosuvastatin (CRESTOR) 5 MG tablet Take 1 tablet (5 mg total) by mouth daily.  . saxagliptin HCl (ONGLYZA) 2.5 MG TABS tablet Take 1 tablet (2.5 mg total) by mouth daily.  . sucralfate (CARAFATE) 1 GM/10ML suspension Take 10 mLs (1 g total) by mouth 4 (four) times daily -  with meals and at bedtime.  . Tiotropium Bromide Monohydrate (SPIRIVA RESPIMAT) 2.5 MCG/ACT AERS Inhale 2 puffs into the lungs daily.  Marland Kitchen tiZANidine (ZANAFLEX) 2 MG tablet Take 3 tablets (6 mg total) by mouth every 8 (eight) hours as needed for muscle spasms.  . tranexamic acid (LYSTEDA) 650 MG TABS tablet Take 2 tablets (1,300 mg total) by mouth 3 (three) times daily. Take during menses for a maximum of five days  . TRUEplus Lancets 28G MISC 1 each by Other route in the morning, at noon, in the evening, and at bedtime.  . Turmeric 500 MG CAPS Take 500 mg by mouth 2 (two) times daily.  . Vitamin D, Ergocalciferol, (DRISDOL) 1.25 MG (50000 UNIT) CAPS capsule Take 1 capsule (50,000 Units total) by mouth every 7 (seven) days. (Patient taking differently: Take 50,000 Units by mouth every Friday. )  . [DISCONTINUED] baclofen (LIORESAL) 10 MG tablet Take 0.5-1 tablets (5-10 mg total) by mouth 3 (three) times daily as needed for muscle spasms.     Allergies:   Ketoprofen, Aspirin,  Gabapentin, Ibuprofen, Liraglutide, Naproxen, Omeprazole-sodium bicarbonate, Sulfa antibiotics, and Tramadol   Social History   Tobacco Use  . Smoking status: Current Every Day  Smoker    Packs/day: 0.50    Years: 26.00    Pack years: 13.00    Types: Cigarettes  . Smokeless tobacco: Never Used  . Tobacco comment: 4-5 cigarettes/day  Vaping Use  . Vaping Use: Never used  Substance Use Topics  . Alcohol use: No  . Drug use: No     Family Hx: The patient's family history includes Diabetes in her mother; Hypertension in her mother.  ROS:   Please see the history of present illness.    All other systems reviewed and are negative.   Prior CV studies:   The following studies were reviewed today:  Echo 04/30/2020- IMPRESSIONS    1. Left ventricular ejection fraction, by estimation, is 55 to 60%. The  left ventricle has normal function. The left ventricle has no regional  wall motion abnormalities. There is moderate concentric left ventricular  hypertrophy. Left ventricular  diastolic parameters are consistent with Grade I diastolic dysfunction  (impaired relaxation). Elevated left ventricular end-diastolic pressure.  2. Right ventricular systolic function is normal. The right ventricular  size is normal.  3. The mitral valve is normal in structure. Trivial mitral valve  regurgitation. No evidence of mitral stenosis.  4. The aortic valve is normal in structure. Aortic valve regurgitation is  not visualized. No aortic stenosis is present.  5. The inferior vena cava is dilated in size with <50% respiratory  variability, suggesting right atrial pressure of 15 mmHg.   Labs/Other Tests and Data Reviewed:    EKG:  An ECG dated 07/02/2020 was personally reviewed today and demonstrated:  Sinus tachycardia-111, NSST changes  Recent Labs: 11/05/2019: TSH 3.100 06/22/2020: B Natriuretic Peptide 266.3 06/24/2020: Magnesium 1.7 07/04/2020: ALT 15; BUN 10; Creatinine, Ser 1.16;  Hemoglobin 9.2; Platelets 256; Potassium 4.7; Sodium 136   Recent Lipid Panel Lab Results  Component Value Date/Time   CHOL 135 06/02/2020 01:52 PM   TRIG 166 (H) 06/02/2020 01:52 PM   HDL 29 (L) 06/02/2020 01:52 PM   CHOLHDL 4.7 (H) 06/02/2020 01:52 PM   CHOLHDL 2.5 03/09/2017 03:19 PM   LDLCALC 77 06/02/2020 01:52 PM    Wt Readings from Last 3 Encounters:  07/13/20 270 lb (122.5 kg)  07/12/20 267 lb 12.8 oz (121.5 kg)  07/04/20 262 lb 12.6 oz (119.2 kg)     Objective:    Vital Signs:  BP 140/73   Pulse (!) 110   Ht 5' 4" (1.626 m)   Wt 270 lb (122.5 kg)   BMI 46.35 kg/m    VITAL SIGNS:  reviewed  ASSESSMENT & PLAN:    DKA- admitted 9/17-9/19 with DKA, MS changes, and AKI  Chronic DCHF- her weight is up- she seemed unsure of her diuretic dose.  Will arrange for an office visit.  Chest discomfort, tachycardia, and elevated D-dimer- PE work up per pulmonary  Hypertension: stable. -Continue Losartan and Toprol  Morbid Obesity - BMI 47.5. -Needs aggressive lifestyle modifications.She would benefit from referral to [facility].  Type 2 IDDM- per PCP- she does not yet have a f/u appointment  Plan: Arrange office visit in the next week or two.  She will contact her PCP for follow up as well.  COVID-19 Education: The signs and symptoms of COVID-19 were discussed with the patient and how to seek care for testing (follow up with PCP or arrange E-visit).  The importance of social distancing was discussed today.  Time:   Today, I have spent 20 minutes with the  patient with telehealth technology discussing the above problems.     Medication Adjustments/Labs and Tests Ordered: Current medicines are reviewed at length with the patient today.  Concerns regarding medicines are outlined above.   Tests Ordered: No orders of the defined types were placed in this encounter.   Medication Changes: No orders of the defined types were placed in this  encounter.   Follow Up:  In Person one to two weeks with Dr Gwenlyn Found or an APP at [facility].  Signed, Kerin Ransom, PA-C  07/13/2020 11:14 AM    [place]

## 2020-07-14 NOTE — Telephone Encounter (Signed)
Attempted to call pt but unable to reach. Left message for her to return call. 

## 2020-07-14 NOTE — Telephone Encounter (Signed)
Spoke with patient she is requesting that her last OV notes and labs be sent to Medicaid at [phone]. Forms have been faxed. Nothing further needed at this time.

## 2020-07-16 ENCOUNTER — Ambulatory Visit (HOSPITAL_COMMUNITY)
Admission: RE | Admit: 2020-07-16 | Discharge: 2020-07-16 | Disposition: A | Discharge status: Home / Self Care | Payer: Medicaid Other | Source: Ambulatory Visit | Attending: Cardiovascular Disease | Admitting: Cardiovascular Disease

## 2020-07-16 ENCOUNTER — Ambulatory Visit: Admission: RE | Admit: 2020-07-16 | Payer: Self-pay | Source: Ambulatory Visit

## 2020-07-16 ENCOUNTER — Encounter: Payer: Self-pay | Admitting: Physician Assistant

## 2020-07-16 ENCOUNTER — Other Ambulatory Visit: Payer: Self-pay

## 2020-07-16 ENCOUNTER — Ambulatory Visit (INDEPENDENT_AMBULATORY_CARE_PROVIDER_SITE_OTHER): Payer: Self-pay | Admitting: Physician Assistant

## 2020-07-16 VITALS — BP 132/78 | HR 116 | Ht 64.0 in | Wt 266.0 lb

## 2020-07-16 DIAGNOSIS — R0602 Shortness of breath: Secondary | ICD-10-CM | POA: Diagnosis present

## 2020-07-16 DIAGNOSIS — R7989 Other specified abnormal findings of blood chemistry: Secondary | ICD-10-CM

## 2020-07-16 DIAGNOSIS — I10 Essential (primary) hypertension: Secondary | ICD-10-CM

## 2020-07-16 DIAGNOSIS — I5032 Chronic diastolic (congestive) heart failure: Secondary | ICD-10-CM

## 2020-07-16 DIAGNOSIS — Z79899 Other long term (current) drug therapy: Secondary | ICD-10-CM

## 2020-07-16 DIAGNOSIS — E119 Type 2 diabetes mellitus without complications: Secondary | ICD-10-CM

## 2020-07-16 DIAGNOSIS — Z794 Long term (current) use of insulin: Secondary | ICD-10-CM

## 2020-07-16 NOTE — Progress Notes (Signed)
Cardiology Office Note:    Date:  07/18/2020   ID:  Jill Shaw, DOB May 09, 1976, MRN [id number]  PCP:  Jill Francois, NP  [facility] Cardiologist:  Jill Burow, MD  [place] Electrophysiologist:  None   Referring MD: Jill Francois, NP   Chief Complaint  Patient presents with  . Follow-up    seen for Dr. Gwenlyn Shaw    History of Present Illness:    Jill Shaw is a 44 y.o. female with a hx of labile hypertension with previous multiple ED visits for hypotension, DM 2, chronic diastolic heart failure, COPD, asthma, chronic pain syndrome, sickle cell trait, morbid obesity.  Echocardiogram obtained in July 2021 showed EF 55 to 60%, normal wall motion, moderate LVH, grade 1 DD.  Patient was admitted in early September with acute on chronic diastolic heart failure.  Discharge weight was 268 pounds.  She was eventually discharged on 40 mg twice a day of Lasix after diuresing 2 L.  She returned to the hospital on 9/17 due to altered mental status, nausea and vomiting.  Blood sugar was over 900.  She also had acute kidney injury with creatinine of 1.87.  Symptom improved after controlling her blood sugar and hydration.  Creatinine on discharge was 1.16, her weight was 267 pounds.  She was discharged again on 40 mg twice a day of Lasix.  She was evaluated by pulmonology service on 9/27 due to shortness of breath and chest discomfort.  It was felt her symptom was multifactorial secondary to morbid obesity, smoking, COPD, chronic diastolic heart failure.  Her weight was up 3 pounds.  She was seen virtually by Jill Ransom, PA-C on 07/13/2020, at which time patient was Shaw to have an elevated D-dimer and was pending CT angiogram of the chest by pulmonology service.  She was also confused about her diuretic dosing, therefore in office visit was arranged to further assess her volume status.  Patient presents today for follow-up.  Her weight is 266 pounds which is very stable.  She is still  taking Lasix 40 mg twice a day with 20 mEq daily of potassium supplement.  Despite on the high dose Toprol-XL, heart rate is still borderline tachycardic.  This is likely related to her obesity and poor functional ability.  She is due for venous Doppler and CTA later this afternoon to rule out DVT and PE.  We gave her the heart failure instructions including avoiding salt, limit fluid intake to between 32 to 64 ounces per day and daily weight.  She will need to bring her weight diary to every single office visit in the future.  On physical exam, she appears to be euvolemic.  She denies any chest pain however does continue to have some degree of dyspnea on exertion.  I recommend a repeat basic metabolic panel in 1 to 2 weeks and follow-up with me in 4 weeks.  She would need to see Dr. Gwenlyn Shaw in 3 to 4 months.   Past Medical History:  Diagnosis Date  . Anemia   . Arthritis    knees, hands  . Asthma   . Chronic diastolic (congestive) heart failure ([place])   . COPD (chronic obstructive pulmonary disease) ([place])   . Diabetes mellitus without complication ([place])    type 2  . Dysfunctional uterine bleeding   . GERD (gastroesophageal reflux disease)   . Hypertension   . Neuromuscular disorder (H[facility])    neuropathy feet  . Seizures ([place]) 09/12/2017  pt states r/t stress and blood sugar - no meds last one 4 months ago, not seen neurologist  . Sickle cell trait ([place])   . Smoker   . Vitamin D deficiency 10/2019  . Wears glasses     Past Surgical History:  Procedure Laterality Date  . CESAREAN SECTION     x 1. for twins  . DILATION AND CURETTAGE OF UTERUS N/A 08/20/2019   Procedure: DILATATION AND CURETTAGE;  Surgeon: Emily Filbert, MD;  Location: [facility];  Service: Gynecology;  Laterality: N/A;  . ENDOMETRIAL ABLATION N/A 08/20/2019   Procedure: [place] Ablation;  Surgeon: Emily Filbert, MD;  Location: [facility];  Service: Gynecology;  Laterality: N/A;  . EYE SURGERY Bilateral    laser right and cataract  removed left eye  . RADIOLOGY WITH ANESTHESIA N/A 09/16/2019   Procedure: MRI WITH ANESTHESIA   L SPINE WITHOUT CONTRAST, T SPINE WITHOUT CONTRAST , CERVICAL WITHOUT CONTRAST;  Surgeon: Radiologist, Medication, MD;  Location: [facility];  Service: Radiology;  Laterality: N/A;  . TUBAL LIGATION     interval BTL  . UPPER GI ENDOSCOPY  07/2017    Current Medications: Current Meds  Medication Sig  . albuterol (PROVENTIL) (2.5 MG/3ML) 0.083% nebulizer solution Take 3 mLs (2.5 mg total) by nebulization every 6 (six) hours as needed for wheezing or shortness of breath.  Marland Kitchen albuterol (VENTOLIN HFA) 108 (90 Base) MCG/ACT inhaler Inhale 2 puffs into the lungs every 6 (six) hours as needed for wheezing or shortness of breath.  . blood glucose meter kit and supplies KIT Dispense based on patient and insurance preference. Use up to four times daily as directed. (FOR ICD-9 250.00, 250.01). (Patient taking differently: 1 each by Other route See admin instructions. Dispense based on patient and insurance preference. Use up to four times daily as directed. (FOR ICD-9 250.00, 250.01).)  . Blood Glucose Monitoring Suppl (TRUE METRIX METER) w/Device KIT 1 each by Does not apply route 4 (four) times daily -  before meals and at bedtime.  . Blood Pressure Monitoring (BLOOD PRESSURE KIT) DEVI Check BP BID prn (Patient taking differently: 1 each by Other route 2 (two) times daily as needed (blood pressure). )  . budesonide-formoterol (SYMBICORT) 160-4.5 MCG/ACT inhaler INHALE 2 PUFFS INTO THE LUNGS 2 (TWO) TIMES DAILY. (Patient taking differently: Inhale 2 puffs into the lungs 2 (two) times daily. )  . cetirizine (ZYRTEC) 10 MG tablet Take 1 tablet (10 mg total) by mouth daily.  . Cholecalciferol (VITAMIN D-3) 125 MCG (5000 UT) TABS Take 1 tablet by mouth daily. (Patient taking differently: Take 5,000 Units by mouth daily. )  . cyclobenzaprine (FLEXERIL) 10 MG tablet Take 1 tablet (10 mg total) by mouth 3 (three) times daily as  needed for muscle spasms.  . diclofenac (VOLTAREN) 75 MG EC tablet Take 75 mg by mouth 2 (two) times daily as needed.  . ferrous sulfate 325 (65 FE) MG tablet Take 1 tablet (325 mg total) by mouth 3 (three) times daily with meals. (Patient taking differently: Take 325 mg by mouth 2 (two) times daily with a meal. )  . fluticasone (FLONASE) 50 MCG/ACT nasal spray Place 2 sprays into both nostrils daily.  . furosemide (LASIX) 40 MG tablet Take 1 tablet (40 mg total) by mouth 2 (two) times daily.  . Glucosamine Sulfate 1000 MG CAPS Take 1 capsule (1,000 mg total) by mouth 2 (two) times daily.  Marland Kitchen glucose blood (TRUE METRIX BLOOD GLUCOSE TEST) test strip Use  as instructed  . hydroquinone 4 % cream Apply topically 2 (two) times daily.  . Insulin Lispro Prot & Lispro (HUMALOG MIX 75/25 KWIKPEN) (75-25) 100 UNIT/ML Kwikpen INJECT 110 UNITS EVERY 12 HOURS  . Insulin Pen Needle (PEN NEEDLES) 30G X 5 MM MISC 1 Units by Does not apply route as directed.  . metoCLOPramide (REGLAN) 10 MG tablet Take 1 tablet (10 mg total) by mouth 4 (four) times daily -  before meals and at bedtime.  . metoprolol succinate (TOPROL-XL) 100 MG 24 hr tablet Take 1 tablet (100 mg total) by mouth daily. Take with or immediately following a meal.  . nicotine (NICODERM CQ) 21 mg/24hr patch Place 1 patch (21 mg total) onto the skin daily.  . nicotine polacrilex (COMMIT) 4 MG lozenge Take 1 lozenge (4 mg total) by mouth as needed for smoking cessation.  . norethindrone (AYGESTIN) 5 MG tablet Take 2 tablets (10 mg total) by mouth in the morning, at noon, in the evening, and at bedtime.  Marland Kitchen omeprazole (PRILOSEC) 40 MG capsule TAKE 1 CAPSULE (40 MG TOTAL) BY MOUTH DAILY.  Marland Kitchen potassium chloride SA (KLOR-CON) 20 MEQ tablet Take 1 tablet (20 mEq total) by mouth daily.  . promethazine (PHENERGAN) 12.5 MG tablet Take 1 tablet (12.5 mg total) by mouth every 6 (six) hours as needed for nausea or vomiting.  . rosuvastatin (CRESTOR) 5 MG tablet Take 1  tablet (5 mg total) by mouth daily.  . saxagliptin HCl (ONGLYZA) 2.5 MG TABS tablet Take 1 tablet (2.5 mg total) by mouth daily.  . sucralfate (CARAFATE) 1 GM/10ML suspension Take 10 mLs (1 g total) by mouth 4 (four) times daily -  with meals and at bedtime.  . Tiotropium Bromide Monohydrate (SPIRIVA RESPIMAT) 2.5 MCG/ACT AERS Inhale 2 puffs into the lungs daily.  . tranexamic acid (LYSTEDA) 650 MG TABS tablet Take 2 tablets (1,300 mg total) by mouth 3 (three) times daily. Take during menses for a maximum of five days  . TRUEplus Lancets 28G MISC 1 each by Other route in the morning, at noon, in the evening, and at bedtime.  . Turmeric 500 MG CAPS Take 500 mg by mouth 2 (two) times daily.  . Vitamin D, Ergocalciferol, (DRISDOL) 1.25 MG (50000 UNIT) CAPS capsule Take 1 capsule (50,000 Units total) by mouth every 7 (seven) days. (Patient taking differently: Take 50,000 Units by mouth every Friday. )     Allergies:   Ketoprofen, Aspirin, Gabapentin, Ibuprofen, Liraglutide, Naproxen, Omeprazole-sodium bicarbonate, Sulfa antibiotics, and Tramadol   Social History   Socioeconomic History  . Marital status: Legally Separated    Spouse name: Not on file  . Number of children: 3  . Years of education: Not on file  . Highest education level: Not on file  Occupational History  . Occupation: unemployed  Tobacco Use  . Smoking status: Current Every Day Smoker    Packs/day: 0.50    Years: 26.00    Pack years: 13.00    Types: Cigarettes  . Smokeless tobacco: Never Used  . Tobacco comment: 4-5 cigarettes/day  Vaping Use  . Vaping Use: Never used  Substance and Sexual Activity  . Alcohol use: No  . Drug use: No  . Sexual activity: Not Currently    Birth control/protection: None  Other Topics Concern  . Not on file  Social History Narrative   Right Handed   Lives in a one story apartment, but lives on the second floor   Drinks caffeine once in [place]  Social Determinants of Health    Financial Resource Strain:   . Difficulty of Paying Living Expenses: Not on file  Food Insecurity: No Food Insecurity  . Worried About Charity fundraiser in the Last Year: Never true  . Ran Out of Food in the Last Year: Never true  Transportation Needs: No Transportation Needs  . Lack of Transportation (Medical): No  . Lack of Transportation (Non-Medical): No  Physical Activity:   . Days of Exercise per Week: Not on file  . Minutes of Exercise per Session: Not on file  Stress:   . Feeling of Stress : Not on file  Social Connections:   . Frequency of Communication with Friends and Family: Not on file  . Frequency of Social Gatherings with Friends and Family: Not on file  . Attends Religious Services: Not on file  . Active Member of Clubs or Organizations: Not on file  . Attends Archivist Meetings: Not on file  . Marital Status: Not on file     Family History: The patient's family history includes Diabetes in her mother; Hypertension in her mother.  ROS:   Please see the history of present illness.     All other systems reviewed and are negative.  EKGs/Labs/Other Studies Reviewed:    The following studies were reviewed today:  Echo 04/30/2020 1. Left ventricular ejection fraction, by estimation, is 55 to 60%. The  left ventricle has normal function. The left ventricle has no regional  wall motion abnormalities. There is moderate concentric left ventricular  hypertrophy. Left ventricular  diastolic parameters are consistent with Grade I diastolic dysfunction  (impaired relaxation). Elevated left ventricular end-diastolic pressure.  2. Right ventricular systolic function is normal. The right ventricular  size is normal.  3. The mitral valve is normal in structure. Trivial mitral valve  regurgitation. No evidence of mitral stenosis.  4. The aortic valve is normal in structure. Aortic valve regurgitation is  not visualized. No aortic stenosis is present.   5. The inferior vena cava is dilated in size with <50% respiratory  variability, suggesting right atrial pressure of 15 mmHg.   EKG:  EKG is ordered today.  The ekg ordered today demonstrates sinus tachycardia with HR 117  Recent Labs: 11/05/2019: TSH 3.100 06/22/2020: B Natriuretic Peptide 266.3 06/24/2020: Magnesium 1.7 07/12/2020: ALT 10; BUN 14; Creatinine, Ser 1.23; Hemoglobin 9.1; Platelets 366.0; Potassium 3.6; Pro B Natriuretic peptide (BNP) 57.0; Sodium 136  Recent Lipid Panel    Component Value Date/Time   CHOL 135 06/02/2020 1352   TRIG 166 (H) 06/02/2020 1352   HDL 29 (L) 06/02/2020 1352   CHOLHDL 4.7 (H) 06/02/2020 1352   CHOLHDL 2.5 03/09/2017 1519   VLDL 29 03/09/2017 1519   LDLCALC 77 06/02/2020 1352    Physical Exam:    VS:  BP 132/78 (BP Location: Other (Comment), Patient Position: Sitting, Cuff Size: Normal) Comment (BP Location): Right forearm  Pulse (!) 116   Ht _0  (1.626 m)   Wt 266 lb (120.7 kg)   BMI 45.66 kg/m     Wt Readings from Last 3 Encounters:  07/16/20 266 lb (120.7 kg)  07/13/20 270 lb (122.5 kg)  07/12/20 267 lb 12.8 oz (121.5 kg)     GEN:  Well nourished, well developed in no acute distress HEENT: Normal NECK: No JVD; No carotid bruits LYMPHATICS: No lymphadenopathy CARDIAC: RRR, no murmurs, rubs, gallops RESPIRATORY:  Clear to auscultation without rales, wheezing or rhonchi  ABDOMEN: Soft, non-tender,  non-distended MUSCULOSKELETAL:  No edema; No deformity  SKIN: Warm and dry NEUROLOGIC:  Alert and oriented x 3 PSYCHIATRIC:  Normal affect   ASSESSMENT:    1. SOB (shortness of breath)   2. Medication management   3. Essential hypertension   4. Controlled type 2 diabetes mellitus without complication, with long-term current use of insulin ([place])   5. Chronic diastolic CHF (congestive heart failure) ([place])   6. Morbid obesity ([place])    PLAN:    In order of problems listed above:  1. Shortness of breath: This is likely  multifactorial given chronic diastolic heart failure and her obesity.  She is due for lower extremity Doppler and CTA of the chest to rule out DVT and PE.   2. Hypertension: Blood pressure stable.  Continue metoprolol to help with rate control  3. DM2: Continue insulin.  Managed by primary care provider  4. Chronic diastolic heart failure: Euvolemic on current therapy. Obtain BMET  5. Morbid obesity: Weight loss imperative.  Likely contributing to sinus tachycardia with minimal exertion.   Medication Adjustments/Labs and Tests Ordered: Current medicines are reviewed at length with the patient today.  Concerns regarding medicines are outlined above.  Orders Placed This Encounter  Procedures  . Basic metabolic panel  . EKG 12-Lead   No orders of the defined types were placed in this encounter.   Patient Instructions  Medication Instructions:  Your physician recommends that you continue on your current medications as directed. Please refer to the Current Medication list given to you today.  *If you need a refill on your cardiac medications before your next appointment, please call your pharmacy*   Lab Work: Your physician recommends that you return for lab work in:1-2 weeks   BMET If you have labs (blood work) drawn today and your tests are completely normal, you will receive your results only by: Marland Kitchen MyChart Message (if you have MyChart) OR . A paper copy in the mail If you have any lab test that is abnormal or we need to change your treatment, we will call you to review the results.   Testing/Procedures: NONE ordered at this time of appointment   Follow-Up: At [facility], you and your health needs are our priority.  As part of our continuing mission to provide you with exceptional heart care, we have created designated Provider Care Teams.  These Care Teams include your primary Cardiologist (physician) and Advanced Practice Providers (APPs -  Physician Assistants and Nurse  Practitioners) who all work together to provide you with the care you need, when you need it.  Your next appointment:   4 week(s) 3-4 month(s)  The format for your next appointment:   In Person In Person   Provider:   Almyra Deforest, PA-C Jill Burow, MD  Other Instructions Heart Failure Instruction: 1. Avoid salt 2. Keep daily fluid intake to between 32 - 64 oz 3. Daily weight, bring weight diary to every cardiology visit, call cardiology if weight increase by more than 3 lbs overnight or 5 lbs in a single week.     Hilbert Corrigan, [place]  07/18/2020 9:23 PM    [facility]

## 2020-07-16 NOTE — Patient Instructions (Addendum)
Medication Instructions:  Your physician recommends that you continue on your current medications as directed. Please refer to the Current Medication list given to you today.  *If you need a refill on your cardiac medications before your next appointment, please call your pharmacy*   Lab Work: Your physician recommends that you return for lab work in:1-2 weeks   BMET If you have labs (blood work) drawn today and your tests are completely normal, you will receive your results only by: Marland Kitchen MyChart Message (if you have MyChart) OR . A paper copy in the mail If you have any lab test that is abnormal or we need to change your treatment, we will call you to review the results.   Testing/Procedures: NONE ordered at this time of appointment   Follow-Up: At [facility], you and your health needs are our priority.  As part of our continuing mission to provide you with exceptional heart care, we have created designated Provider Care Teams.  These Care Teams include your primary Cardiologist (physician) and Advanced Practice Providers (APPs -  Physician Assistants and Nurse Practitioners) who all work together to provide you with the care you need, when you need it.  Your next appointment:   4 week(s) 3-4 month(s)  The format for your next appointment:   In Person In Person   Provider:   Azalee Course, PA-C Nanetta Batty, MD  Other Instructions Heart Failure Instruction: 1. Avoid salt 2. Keep daily fluid intake to between 32 - 64 oz 3. Daily weight, bring weight diary to every cardiology visit, call cardiology if weight increase by more than 3 lbs overnight or 5 lbs in a single week.

## 2020-07-18 ENCOUNTER — Encounter: Payer: Self-pay | Admitting: Physician Assistant

## 2020-07-19 ENCOUNTER — Encounter: Payer: Self-pay | Admitting: Nurse Practitioner

## 2020-07-19 ENCOUNTER — Ambulatory Visit (INDEPENDENT_AMBULATORY_CARE_PROVIDER_SITE_OTHER): Payer: Self-pay | Admitting: Nurse Practitioner

## 2020-07-19 ENCOUNTER — Other Ambulatory Visit: Payer: Self-pay

## 2020-07-19 VITALS — BP 136/77 | HR 76 | Temp 98.2°F | Ht 64.0 in | Wt 271.0 lb

## 2020-07-19 DIAGNOSIS — R200 Anesthesia of skin: Secondary | ICD-10-CM

## 2020-07-19 DIAGNOSIS — Z87898 Personal history of other specified conditions: Secondary | ICD-10-CM

## 2020-07-19 DIAGNOSIS — R202 Paresthesia of skin: Secondary | ICD-10-CM

## 2020-07-19 DIAGNOSIS — F17200 Nicotine dependence, unspecified, uncomplicated: Secondary | ICD-10-CM

## 2020-07-19 DIAGNOSIS — F321 Major depressive disorder, single episode, moderate: Secondary | ICD-10-CM

## 2020-07-19 DIAGNOSIS — Z794 Long term (current) use of insulin: Secondary | ICD-10-CM

## 2020-07-19 DIAGNOSIS — D573 Sickle-cell trait: Secondary | ICD-10-CM | POA: Insufficient documentation

## 2020-07-19 DIAGNOSIS — E119 Type 2 diabetes mellitus without complications: Secondary | ICD-10-CM

## 2020-07-19 LAB — POCT URINALYSIS DIPSTICK (MANUAL)
Leukocytes, UA: NEGATIVE
Nitrite, UA: NEGATIVE
Poct Bilirubin: NEGATIVE
Poct Blood: NEGATIVE
Poct Glucose: NORMAL mg/dL
Poct Ketones: NEGATIVE
Poct Protein: NEGATIVE mg/dL
Poct Urobilinogen: 1 mg/dL — AB
Spec Grav, UA: 1.01 (ref 1.010–1.025)
pH, UA: 6 (ref 5.0–8.0)

## 2020-07-19 LAB — POCT GLYCOSYLATED HEMOGLOBIN (HGB A1C): Hemoglobin A1C: 9.2 % — AB (ref 4.0–5.6)

## 2020-07-19 MED ORDER — TIZANIDINE HCL 6 MG PO CAPS: 6.0000 mg | ORAL_CAPSULE | Freq: Three times a day (TID) | ORAL | 2 refills | Status: DC

## 2020-07-19 MED ORDER — TRUE METRIX BLOOD GLUCOSE TEST VI STRP: ORAL_STRIP | 12 refills | Status: DC

## 2020-07-19 MED ORDER — BUPROPION HCL ER (SR) 150 MG PO TB12: 150.0000 mg | ORAL_TABLET | Freq: Two times a day (BID) | ORAL | 11 refills | Status: DC

## 2020-07-19 MED FILL — BUPROPION SR 150 MG TABLET: 150 | 30 days supply | Qty: 60 | Fill #0

## 2020-07-19 NOTE — Progress Notes (Signed)
[place], [place]  [address] Phone:  [phone]   Fax:  [phone]   Established Patient Office Visit  Subjective:  Patient ID: Jill Shaw, female    DOB: [birth year]  Age: 44 y.o. MRN: [id number]  CC:  Chief Complaint  Patient presents with  . Follow-up    need tizanidine refill    HPI Jill Shaw presents for follow up.   Patient is in today for hospital follow-up. Recently admitted for DKA. Her CBG was >600 CMP glucose was 958.  Hospital course of treatment was from 07/02/20 to 07/04/20.  During hospital course she was on an insulin drip and she admits post discharge her CBG continues to range in the 200-300's.    She is adamant that she does not know why her CBG was so elevated. She is currently on Humalog Mix 75/25 110 units BID. She is complaining of having to check her CBG QID. She is having increased pain in her hands and admits that she is running out of testing options. She admits that "blood dust" is coming out of her fingers on the right. She states that she is not eating large amounts of food in-fact her diet generally consist of one real daily. She has a history of ETOH use however this was not evaluated on her last admission.    Depression Patient complains of depression. She complains of depressed mood. Onset was approximately several weeks ago. Symptoms have been gradually worsening since that time. Current symptoms include: depressed mood. Patient denies suicidal attempt, suicidal thoughts with specific plan and suicidal thoughts without plan. Possible organic causes contributing are: ETOH use (hx). Risk factors: negative life event chronic disease since age 68 ongoing with recurrent hospital admissions. Previous treatment includes none.  Past Medical History:  Diagnosis Date  . Anemia   . Arthritis    knees, hands  . Asthma   . Chronic diastolic (congestive) heart failure ([facility])   . COPD (chronic obstructive  pulmonary disease) ([place] [facility])   . Diabetes mellitus without complication ([facility])    type 2  . Dysfunctional uterine bleeding   . GERD (gastroesophageal reflux disease)   . Hypertension   . Neuromuscular disorder ([facility])    neuropathy feet  . Seizures ([place]) 09/12/2017   pt states r/t stress and blood sugar - no meds last one 4 months ago, not seen neurologist  . Sickle cell trait ([facility])   . Smoker   . Vitamin D deficiency 10/2019  . Wears glasses     Past Surgical History:  Procedure Laterality Date  . CESAREAN SECTION     x 1. for twins  . DILATION AND CURETTAGE OF UTERUS N/A 08/20/2019   Procedure: DILATATION AND CURETTAGE;  Surgeon: Emily Filbert, MD;  Location: [facility];  Service: Gynecology;  Laterality: N/A;  . ENDOMETRIAL ABLATION N/A 08/20/2019   Procedure: [place] Ablation;  Surgeon: Emily Filbert, MD;  Location: [facility];  Service: Gynecology;  Laterality: N/A;  . EYE SURGERY Bilateral    laser right and cataract removed left eye  . RADIOLOGY WITH ANESTHESIA N/A 09/16/2019   Procedure: MRI WITH ANESTHESIA   L SPINE WITHOUT CONTRAST, T SPINE WITHOUT CONTRAST , CERVICAL WITHOUT CONTRAST;  Surgeon: Radiologist, Medication, MD;  Location: [facility];  Service: Radiology;  Laterality: N/A;  . TUBAL LIGATION     interval BTL  . UPPER GI ENDOSCOPY  07/2017    Family History  Problem Relation  Age of Onset  . Diabetes Mother   . Hypertension Mother     Social History   Socioeconomic History  . Marital status: Legally Separated    Spouse name: Not on file  . Number of children: 3  . Years of education: Not on file  . Highest education level: Not on file  Occupational History  . Occupation: unemployed  Tobacco Use  . Smoking status: Current Every Day Smoker    Packs/day: 0.50    Years: 26.00    Pack years: 13.00    Types: Cigarettes  . Smokeless tobacco: Never Used  . Tobacco comment: 4-5 cigarettes/day  Vaping Use  . Vaping Use: Never used  Substance and Sexual Activity  . Alcohol  use: No  . Drug use: No  . Sexual activity: Not Currently    Birth control/protection: None  Other Topics Concern  . Not on file  Social History Narrative   Right Handed   Lives in a one story apartment, but lives on the second floor   Drinks caffeine once in awhile   Social Determinants of Health   Financial Resource Strain:   . Difficulty of Paying Living Expenses: Not on file  Food Insecurity: No Food Insecurity  . Worried About Programme researcher, broadcasting/film/video in the Last Year: Never true  . Ran Out of Food in the Last Year: Never true  Transportation Needs: No Transportation Needs  . Lack of Transportation (Medical): No  . Lack of Transportation (Non-Medical): No  Physical Activity:   . Days of Exercise per Week: Not on file  . Minutes of Exercise per Session: Not on file  Stress:   . Feeling of Stress : Not on file  Social Connections:   . Frequency of Communication with Friends and Family: Not on file  . Frequency of Social Gatherings with Friends and Family: Not on file  . Attends Religious Services: Not on file  . Active Member of Clubs or Organizations: Not on file  . Attends Banker Meetings: Not on file  . Marital Status: Not on file  Intimate Partner Violence:   . Fear of Current or Ex-Partner: Not on file  . Emotionally Abused: Not on file  . Physically Abused: Not on file  . Sexually Abused: Not on file    Outpatient Medications Prior to Visit  Medication Sig Dispense Refill  . albuterol (PROVENTIL) (2.5 MG/3ML) 0.083% nebulizer solution Take 3 mLs (2.5 mg total) by nebulization every 6 (six) hours as needed for wheezing or shortness of breath. 150 mL 3  . albuterol (VENTOLIN HFA) 108 (90 Base) MCG/ACT inhaler Inhale 2 puffs into the lungs every 6 (six) hours as needed for wheezing or shortness of breath. 8 g 3  . blood glucose meter kit and supplies KIT Dispense based on patient and insurance preference. Use up to four times daily as directed. (FOR ICD-9  250.00, 250.01). (Patient taking differently: 1 each by Other route See admin instructions. Dispense based on patient and insurance preference. Use up to four times daily as directed. (FOR ICD-9 250.00, 250.01).) 1 each 0  . Blood Glucose Monitoring Suppl (TRUE METRIX METER) w/Device KIT 1 each by Does not apply route 4 (four) times daily -  before meals and at bedtime. 1 kit 0  . Blood Pressure Monitoring (BLOOD PRESSURE KIT) DEVI Check BP BID prn (Patient taking differently: 1 each by Other route 2 (two) times daily as needed (blood pressure). ) 1 each 0  .  budesonide-formoterol (SYMBICORT) 160-4.5 MCG/ACT inhaler INHALE 2 PUFFS INTO THE LUNGS 2 (TWO) TIMES DAILY. (Patient taking differently: Inhale 2 puffs into the lungs 2 (two) times daily. ) 10.2 g 3  . cetirizine (ZYRTEC) 10 MG tablet Take 1 tablet (10 mg total) by mouth daily. 30 tablet 3  . Cholecalciferol (VITAMIN D-3) 125 MCG (5000 UT) TABS Take 1 tablet by mouth daily. (Patient taking differently: Take 5,000 Units by mouth daily. ) 90 tablet 3  . diclofenac (VOLTAREN) 75 MG EC tablet Take 75 mg by mouth 2 (two) times daily as needed.    . ferrous sulfate 325 (65 FE) MG tablet Take 1 tablet (325 mg total) by mouth 3 (three) times daily with meals. (Patient taking differently: Take 325 mg by mouth 2 (two) times daily with a meal. ) 30 tablet 3  . fluticasone (FLONASE) 50 MCG/ACT nasal spray Place 2 sprays into both nostrils daily. 16 g 6  . furosemide (LASIX) 40 MG tablet Take 1 tablet (40 mg total) by mouth 2 (two) times daily. 60 tablet 0  . Glucosamine Sulfate 1000 MG CAPS Take 1 capsule (1,000 mg total) by mouth 2 (two) times daily. 180 capsule 3  . hydroquinone 4 % cream Apply topically 2 (two) times daily. 28.35 g 0  . Insulin Lispro Prot & Lispro (HUMALOG MIX 75/25 KWIKPEN) (75-25) 100 UNIT/ML Kwikpen INJECT 110 UNITS EVERY 12 HOURS 15 mL 11  . Insulin Pen Needle (PEN NEEDLES) 30G X 5 MM MISC 1 Units by Does not apply route as directed.  100 each 11  . metoCLOPramide (REGLAN) 10 MG tablet Take 1 tablet (10 mg total) by mouth 4 (four) times daily -  before meals and at bedtime. 120 tablet 1  . metoprolol succinate (TOPROL-XL) 100 MG 24 hr tablet Take 1 tablet (100 mg total) by mouth daily. Take with or immediately following a meal. 90 tablet 3  . nicotine (NICODERM CQ) 21 mg/24hr patch Place 1 patch (21 mg total) onto the skin daily. 28 patch 3  . nicotine polacrilex (COMMIT) 4 MG lozenge Take 1 lozenge (4 mg total) by mouth as needed for smoking cessation. 108 tablet 3  . norethindrone (AYGESTIN) 5 MG tablet Take 2 tablets (10 mg total) by mouth in the morning, at noon, in the evening, and at bedtime. 120 tablet 1  . omeprazole (PRILOSEC) 40 MG capsule TAKE 1 CAPSULE (40 MG TOTAL) BY MOUTH DAILY. 30 capsule 2  . potassium chloride SA (KLOR-CON) 20 MEQ tablet Take 1 tablet (20 mEq total) by mouth daily. 30 tablet 3  . promethazine (PHENERGAN) 12.5 MG tablet Take 1 tablet (12.5 mg total) by mouth every 6 (six) hours as needed for nausea or vomiting. 15 tablet 0  . rosuvastatin (CRESTOR) 5 MG tablet Take 1 tablet (5 mg total) by mouth daily. 90 tablet 3  . saxagliptin HCl (ONGLYZA) 2.5 MG TABS tablet Take 1 tablet (2.5 mg total) by mouth daily. 90 tablet 3  . sucralfate (CARAFATE) 1 GM/10ML suspension Take 10 mLs (1 g total) by mouth 4 (four) times daily -  with meals and at bedtime. 420 mL 0  . Tiotropium Bromide Monohydrate (SPIRIVA RESPIMAT) 2.5 MCG/ACT AERS Inhale 2 puffs into the lungs daily. 4 g 0  . tranexamic acid (LYSTEDA) 650 MG TABS tablet Take 2 tablets (1,300 mg total) by mouth 3 (three) times daily. Take during menses for a maximum of five days 30 tablet 0  . TRUEplus Lancets 28G MISC 1 each by  Other route in the morning, at noon, in the evening, and at bedtime.    . Turmeric 500 MG CAPS Take 500 mg by mouth 2 (two) times daily. 180 capsule 3  . Vitamin D, Ergocalciferol, (DRISDOL) 1.25 MG (50000 UNIT) CAPS capsule Take 1  capsule (50,000 Units total) by mouth every 7 (seven) days. (Patient taking differently: Take 50,000 Units by mouth every Friday. ) 5 capsule 6  . cyclobenzaprine (FLEXERIL) 10 MG tablet Take 1 tablet (10 mg total) by mouth 3 (three) times daily as needed for muscle spasms. 90 tablet 3  . glucose blood (TRUE METRIX BLOOD GLUCOSE TEST) test strip Use as instructed 100 each 12  . losartan (COZAAR) 50 MG tablet Take 1 tablet (50 mg total) by mouth daily. 30 tablet 2   No facility-administered medications prior to visit.    Allergies  Allergen Reactions  . Ketoprofen Nausea And Vomiting  . Aspirin Nausea Only  . Gabapentin Nausea And Vomiting and Other (See Comments)    upset stomach  . Ibuprofen Nausea And Vomiting  . Liraglutide Nausea And Vomiting  . Naproxen Nausea And Vomiting  . Omeprazole-Sodium Bicarbonate Nausea And Vomiting  . Sulfa Antibiotics Nausea And Vomiting  . Tramadol Nausea And Vomiting and Other (See Comments)    stomach upset    ROS Review of Systems    Objective:    Physical Exam Constitutional:      General: She is not in acute distress.    Appearance: She is obese. She is not ill-appearing, toxic-appearing or diaphoretic.  HENT:     Head: Normocephalic and atraumatic.     Nose: Nose normal.     Mouth/Throat:     Mouth: Mucous membranes are moist.  Cardiovascular:     Rate and Rhythm: Normal rate.     Pulses: Normal pulses.     Heart sounds: Normal heart sounds.  Pulmonary:     Effort: Pulmonary effort is normal.     Comments: Diminished  Abdominal:     Palpations: Abdomen is soft.     Comments: hypoactive  Musculoskeletal:        General: Normal range of motion.     Cervical back: Normal range of motion.     Right lower leg: Edema present.     Comments: Trace to 1+  Skin:    General: Skin is warm and dry.     Capillary Refill: Capillary refill takes less than 2 seconds.  Neurological:     General: No focal deficit present.     Mental  Status: She is alert and oriented to person, place, and time.     BP 136/77   Pulse 76   Temp 98.2 F (36.8 C)   Ht $R'5\' 4"'vi$  (1.626 m)   Wt 271 lb (122.9 kg)   SpO2 100%   BMI 46.52 kg/m  Wt Readings from Last 3 Encounters:  07/19/20 271 lb (122.9 kg)  07/16/20 266 lb (120.7 kg)  07/13/20 270 lb (122.5 kg)     Health Maintenance Due  Topic Date Due  . URINE MICROALBUMIN  Never done  . OPHTHALMOLOGY EXAM  11/16/2017  . PAP SMEAR-Modifier  07/25/2020    There are no preventive care reminders to display for this patient.  Lab Results  Component Value Date   TSH 3.100 11/05/2019   Lab Results  Component Value Date   WBC 13.4 (H) 07/12/2020   HGB 9.1 (L) 07/12/2020   HCT 30.0 (L) 07/12/2020   MCV  67.4 Repeated and verified X2. (L) 07/12/2020   PLT 366.0 07/12/2020   Lab Results  Component Value Date   NA 136 07/12/2020   K 3.6 07/12/2020   CO2 27 07/12/2020   GLUCOSE 275 (H) 07/12/2020   BUN 14 07/12/2020   CREATININE 1.23 (H) 07/12/2020   BILITOT 0.4 07/12/2020   ALKPHOS 70 07/12/2020   AST 26 07/12/2020   ALT 10 07/12/2020   PROT 7.2 07/12/2020   ALBUMIN 4.0 07/12/2020   CALCIUM 8.6 07/12/2020   ANIONGAP 11 07/04/2020   GFR 57.50 (L) 07/12/2020   Lab Results  Component Value Date   CHOL 135 06/02/2020   Lab Results  Component Value Date   HDL 29 (L) 06/02/2020   Lab Results  Component Value Date   LDLCALC 77 06/02/2020   Lab Results  Component Value Date   TRIG 166 (H) 06/02/2020   Lab Results  Component Value Date   CHOLHDL 4.7 (H) 06/02/2020   Lab Results  Component Value Date   HGBA1C 9.2 (A) 07/19/2020      Assessment & Plan:   Problem List Items Addressed This Visit      Endocrine   Type 2 diabetes mellitus without complication, with long-term current use of insulin ([place]) - Primary Encourage compliance with current treatment regimen  Dose adjustment in the hospital will continue this for now. Endocrinology referral when  medicaid is finalized.  Encourage regular CBG monitoring. Would like to get patient freestyle liber or continuous monitoring due to QID testing and recent hospitalization and CBG >600  Encourage contacting office if excessive hyperglycemia and or hypoglycemia Lifestyle modification with healthy diet (fewer calories, more high fiber foods, whole grains and non-starchy vegetables, lower fat meat and fish, low-fat diary include healthy oils) regular exercise (physical activity) and weight loss Opthalmology exam discussed  Nutritional consult recommended Regular dental visits encouraged Home BP monitoring also encouraged goal <130/80     Relevant Medications   glucose blood (TRUE METRIX BLOOD GLUCOSE TEST) test strip   Other Relevant Orders   Microalbumin, urine   POCT HgB A1C (Completed)   POCT Urinalysis Dip Manual (Completed)     Other   Current moderate episode of major depressive disorder without prior episode ([facility])   Trial bupropion for depression and smoking cessation due to chronic co-morbidities  Relevant Medications   buPROPion (WELLBUTRIN SR) 150 MG 12 hr tablet   History of alcohol use   Tobacco dependence    Other Visit Diagnoses    Numbness and tingling of both lower extremities          Meds ordered this encounter  Medications  . tizanidine (ZANAFLEX) 6 MG capsule    Sig: Take 1 capsule (6 mg total) by mouth 3 (three) times daily.    Dispense:  90 capsule    Refill:  2    Order Specific Question:   Supervising Provider    Answer:   Tresa Garter [id number]  . buPROPion (WELLBUTRIN SR) 150 MG 12 hr tablet    Sig: Take 1 tablet (150 mg total) by mouth 2 (two) times daily.    Dispense:  60 tablet    Refill:  11    Order Specific Question:   Supervising Provider    Answer:   Tresa Garter [id number]  . glucose blood (TRUE METRIX BLOOD GLUCOSE TEST) test strip    Sig: Use as instructed    Dispense:  400 each    Refill:  12  Order Specific Question:    Supervising Provider    Answer:   Tresa Garter [[id number]]    Follow-up: Return for Appointment As Scheduled.    Vevelyn Francois, NP

## 2020-07-20 ENCOUNTER — Encounter: Payer: Self-pay | Admitting: Nurse Practitioner

## 2020-07-20 ENCOUNTER — Telehealth: Payer: Self-pay | Admitting: Pulmonary Disease

## 2020-07-20 ENCOUNTER — Other Ambulatory Visit: Payer: Self-pay | Admitting: Nurse Practitioner

## 2020-07-20 ENCOUNTER — Ambulatory Visit (INDEPENDENT_AMBULATORY_CARE_PROVIDER_SITE_OTHER)
Admission: RE | Admit: 2020-07-20 | Discharge: 2020-07-20 | Disposition: A | Discharge status: Home / Self Care | Payer: Self-pay | Source: Ambulatory Visit | Attending: Pulmonary Disease | Admitting: Pulmonary Disease

## 2020-07-20 DIAGNOSIS — R0602 Shortness of breath: Secondary | ICD-10-CM

## 2020-07-20 DIAGNOSIS — R7989 Other specified abnormal findings of blood chemistry: Secondary | ICD-10-CM

## 2020-07-20 DIAGNOSIS — F321 Major depressive disorder, single episode, moderate: Secondary | ICD-10-CM | POA: Insufficient documentation

## 2020-07-20 DIAGNOSIS — R079 Chest pain, unspecified: Secondary | ICD-10-CM

## 2020-07-20 DIAGNOSIS — Z87898 Personal history of other specified conditions: Secondary | ICD-10-CM | POA: Insufficient documentation

## 2020-07-20 MED ORDER — IOHEXOL 350 MG/ML SOLN
80.0000 mL | Freq: Once | INTRAVENOUS | Status: AC | PRN
  Administered 2020-07-20: 80 mL via INTRAVENOUS

## 2020-07-20 NOTE — Telephone Encounter (Signed)
Called and spoke with patient to let her know that I printed off her last office note and faxed it to [phone]. She expressed understanding. Nothing further needed at this time.

## 2020-07-22 ENCOUNTER — Other Ambulatory Visit: Payer: Self-pay | Admitting: Nurse Practitioner

## 2020-07-22 MED ORDER — TIZANIDINE HCL 6 MG PO CAPS: 6.0000 mg | ORAL_CAPSULE | Freq: Three times a day (TID) | ORAL | 2 refills | Status: DC

## 2020-07-22 MED FILL — tiZANidine HCL 6 MG CAPS: 6 | 30 days supply | Qty: 90 | Fill #0

## 2020-07-22 NOTE — Progress Notes (Signed)
Thanks for seeing him.  Josephine Igo, DO [facility] 07/22/2020 12:06 AM

## 2020-07-26 ENCOUNTER — Other Ambulatory Visit: Payer: Self-pay

## 2020-07-26 ENCOUNTER — Other Ambulatory Visit: Payer: Self-pay | Admitting: Nurse Practitioner

## 2020-07-26 ENCOUNTER — Ambulatory Visit (INDEPENDENT_AMBULATORY_CARE_PROVIDER_SITE_OTHER): Payer: Self-pay | Admitting: Obstetrics and Gynecology

## 2020-07-26 ENCOUNTER — Encounter: Payer: Self-pay | Admitting: Obstetrics and Gynecology

## 2020-07-26 ENCOUNTER — Other Ambulatory Visit: Payer: Self-pay | Admitting: Cardiovascular Disease

## 2020-07-26 VITALS — BP 135/70 | HR 94 | Ht 64.0 in | Wt 276.4 lb

## 2020-07-26 DIAGNOSIS — Z79899 Other long term (current) drug therapy: Secondary | ICD-10-CM | POA: Diagnosis not present

## 2020-07-26 DIAGNOSIS — Z9889 Other specified postprocedural states: Secondary | ICD-10-CM

## 2020-07-26 DIAGNOSIS — N939 Abnormal uterine and vaginal bleeding, unspecified: Secondary | ICD-10-CM

## 2020-07-26 DIAGNOSIS — I10 Essential (primary) hypertension: Secondary | ICD-10-CM

## 2020-07-26 DIAGNOSIS — Z87898 Personal history of other specified conditions: Secondary | ICD-10-CM

## 2020-07-26 MED ORDER — FUROSEMIDE 40 MG PO TABS: 40.0000 mg | ORAL_TABLET | Freq: Two times a day (BID) | ORAL | 5 refills | Status: DC

## 2020-07-26 MED FILL — ?HUMALOG MIX 75-25 KWIKPEN: (75-25) 100 | 45 days supply | Qty: 45 | Fill #1

## 2020-07-26 MED FILL — FUROSEMIDE 40 MG TAB: 40 | 30 days supply | Qty: 60 | Fill #0

## 2020-07-26 NOTE — Telephone Encounter (Signed)
° ° ° °*  STAT* If patient is at the pharmacy, call can be transferred to refill team.   1. Which medications need to be refilled? (please list name of each medication and dose if known) furosemide (LASIX) 40 MG tablet  2. Which pharmacy/location (including street and city if local pharmacy) is medication to be sent to? [facility] - [place], [place] - [place] [address]  3. Do they need a 30 day or 90 day supply? 90 days

## 2020-07-26 NOTE — Progress Notes (Signed)
Obstetrics and Gynecology Visit Return Patient Evaluation  Appointment Date: 07/26/2020  Primary Care Provider: [place], [place] for [facility]  Chief Complaint: follow up AUB  History of Present Illness:  Jill Shaw is a 44 y.o. African-American P3 (No LMP recorded. Patient has had an ablation.), seen for the above chief complaint.  Patient with irregular AUB and has about 5 days of bleeding per month and that is when she has intense pain and has use narcotics 2x/day on those days. She has received depo provera on 03/02/20 and 05/26/20 and uses the aygestin on the days when she bleeds. She was most recently in the hospital due to heart and diabetes issues and she states she had some bleeding then but none since.   10/2019 TSH: normal  07/2017 pap: neg and hpv negative 07/2017 embx: negative   Review of Systems:  as noted in the History of Present Illness.  Patient Active Problem List   Diagnosis Date Noted  . History of alcohol use 07/20/2020  . Current moderate episode of major depressive disorder without prior episode ([place]) 07/20/2020  . Sickle cell trait ([place]) 07/19/2020  . Elevated d-dimer 07/13/2020  . Healthcare maintenance 07/12/2020  . Shortness of breath 07/12/2020  . Atypical chest pain 07/12/2020  . At risk for obstructive sleep apnea 07/12/2020  . DKA (diabetic ketoacidoses) 07/02/2020  . Acute on chronic diastolic (congestive) heart failure ([place]) 06/22/2020  . Abdominal pain 03/08/2020  . Chronic diastolic CHF (congestive heart failure) ([place]) 03/08/2020  . AKI (acute kidney injury) ([place]) 01/29/2020  . Symptomatic anemia 12/19/2019  . Elevated sed rate 11/26/2019  . Elevated C-reactive protein (CRP) 11/26/2019  . Hypoglycemia 02/07/2019  . Hypotension 02/07/2019  . Lactic acidosis 02/07/2019  . Right ankle pain 02/07/2019  . Chronic obstructive pulmonary disease ([place]) 02/05/2019  . Seizure ([place]) 07/31/2018   . Vitamin D deficiency 05/31/2017  . Gastroesophageal reflux disease 05/24/2017  . Abnormal uterine bleeding (AUB) 05/17/2017  . Anemia of chronic disease 04/06/2017  . Knee pain, chronic 03/27/2016  . Type 2 diabetes mellitus without complication, with long-term current use of insulin ([place]) 03/27/2016  . Essential hypertension 03/27/2016  . Morbid obesity ([place]) 03/27/2016  . Irritable bowel syndrome with constipation 03/27/2016  . Tobacco dependence 03/27/2016  . CKD (chronic kidney disease) stage 2, GFR 60-89 ml/min 03/25/2016  . Arthropathy, lower leg 05/04/2013  . CTS (carpal tunnel syndrome) 05/04/2013  . Peripheral edema 05/04/2013  . Chronic pain 05/13/2012  . Diabetic gastroparesis ([place]) 11/06/2006  . Hot flashes 11/06/2006   Medications:  [name] had no medications administered during this visit. Current Outpatient Medications  Medication Sig Dispense Refill  . albuterol (PROVENTIL) (2.5 MG/3ML) 0.083% nebulizer solution Take 3 mLs (2.5 mg total) by nebulization every 6 (six) hours as needed for wheezing or shortness of breath. 150 mL 3  . albuterol (VENTOLIN HFA) 108 (90 Base) MCG/ACT inhaler Inhale 2 puffs into the lungs every 6 (six) hours as needed for wheezing or shortness of breath. 8 g 3  . blood glucose meter kit and supplies KIT Dispense based on patient and insurance preference. Use up to four times daily as directed. (FOR ICD-9 250.00, 250.01). (Patient taking differently: 1 each by Other route See admin instructions. Dispense based on patient and insurance preference. Use up to four times daily as directed. (FOR ICD-9 250.00, 250.01).) 1 each 0  . Blood Glucose Monitoring Suppl (TRUE METRIX METER) w/Device KIT 1  each by Does not apply route 4 (four) times daily -  before meals and at bedtime. 1 kit 0  . Blood Pressure Monitoring (BLOOD PRESSURE KIT) DEVI Check BP BID prn (Patient taking differently: 1 each by Other route 2 (two) times daily as needed (blood  pressure). ) 1 each 0  . budesonide-formoterol (SYMBICORT) 160-4.5 MCG/ACT inhaler INHALE 2 PUFFS INTO THE LUNGS 2 (TWO) TIMES DAILY. (Patient taking differently: Inhale 2 puffs into the lungs 2 (two) times daily. ) 10.2 g 3  . buPROPion (WELLBUTRIN SR) 150 MG 12 hr tablet Take 1 tablet (150 mg total) by mouth 2 (two) times daily. 60 tablet 11  . cetirizine (ZYRTEC) 10 MG tablet Take 1 tablet (10 mg total) by mouth daily. 30 tablet 3  . Cholecalciferol (VITAMIN D-3) 125 MCG (5000 UT) TABS Take 1 tablet by mouth daily. (Patient taking differently: Take 5,000 Units by mouth daily. ) 90 tablet 3  . diclofenac (VOLTAREN) 75 MG EC tablet Take 75 mg by mouth 2 (two) times daily as needed.    . ferrous sulfate 325 (65 FE) MG tablet Take 1 tablet (325 mg total) by mouth 3 (three) times daily with meals. (Patient taking differently: Take 325 mg by mouth 2 (two) times daily with a meal. ) 30 tablet 3  . fluticasone (FLONASE) 50 MCG/ACT nasal spray Place 2 sprays into both nostrils daily. 16 g 6  . furosemide (LASIX) 40 MG tablet Take 1 tablet (40 mg total) by mouth 2 (two) times daily. 60 tablet 5  . Glucosamine Sulfate 1000 MG CAPS Take 1 capsule (1,000 mg total) by mouth 2 (two) times daily. 180 capsule 3  . glucose blood (TRUE METRIX BLOOD GLUCOSE TEST) test strip Use as instructed 400 each 12  . hydroquinone 4 % cream Apply topically 2 (two) times daily. 28.35 g 0  . Insulin Lispro Prot & Lispro (HUMALOG MIX 75/25 KWIKPEN) (75-25) 100 UNIT/ML Kwikpen INJECT 110 UNITS EVERY 12 HOURS 15 mL 11  . Insulin Pen Needle (PEN NEEDLES) 30G X 5 MM MISC 1 Units by Does not apply route as directed. 100 each 11  . losartan (COZAAR) 50 MG tablet Take 1 tablet (50 mg total) by mouth daily. 30 tablet 2  . metoCLOPramide (REGLAN) 10 MG tablet Take 1 tablet (10 mg total) by mouth 4 (four) times daily -  before meals and at bedtime. 120 tablet 1  . metoprolol succinate (TOPROL-XL) 100 MG 24 hr tablet Take 1 tablet (100 mg  total) by mouth daily. Take with or immediately following a meal. 90 tablet 3  . nicotine (NICODERM CQ) 21 mg/24hr patch Place 1 patch (21 mg total) onto the skin daily. 28 patch 3  . nicotine polacrilex (COMMIT) 4 MG lozenge Take 1 lozenge (4 mg total) by mouth as needed for smoking cessation. 108 tablet 3  . norethindrone (AYGESTIN) 5 MG tablet Take 2 tablets (10 mg total) by mouth in the morning, at noon, in the evening, and at bedtime. 120 tablet 1  . omeprazole (PRILOSEC) 40 MG capsule TAKE 1 CAPSULE (40 MG TOTAL) BY MOUTH DAILY. 30 capsule 2  . potassium chloride SA (KLOR-CON) 20 MEQ tablet Take 1 tablet (20 mEq total) by mouth daily. 30 tablet 3  . promethazine (PHENERGAN) 12.5 MG tablet Take 1 tablet (12.5 mg total) by mouth every 6 (six) hours as needed for nausea or vomiting. 15 tablet 0  . rosuvastatin (CRESTOR) 5 MG tablet Take 1 tablet (5 mg total) by  mouth daily. 90 tablet 3  . saxagliptin HCl (ONGLYZA) 2.5 MG TABS tablet Take 1 tablet (2.5 mg total) by mouth daily. 90 tablet 3  . sucralfate (CARAFATE) 1 GM/10ML suspension Take 10 mLs (1 g total) by mouth 4 (four) times daily -  with meals and at bedtime. 420 mL 0  . Tiotropium Bromide Monohydrate (SPIRIVA RESPIMAT) 2.5 MCG/ACT AERS Inhale 2 puffs into the lungs daily. 4 g 0  . tizanidine (ZANAFLEX) 6 MG capsule Take 1 capsule (6 mg total) by mouth 3 (three) times daily. 90 capsule 2  . TRUEplus Lancets 28G MISC 1 each by Other route in the morning, at noon, in the evening, and at bedtime.    . Turmeric 500 MG CAPS Take 500 mg by mouth 2 (two) times daily. 180 capsule 3  . Vitamin D, Ergocalciferol, (DRISDOL) 1.25 MG (50000 UNIT) CAPS capsule Take 1 capsule (50,000 Units total) by mouth every 7 (seven) days. (Patient taking differently: Take 50,000 Units by mouth every Friday. ) 5 capsule 6   No current facility-administered medications for this visit.    Allergies: is allergic to ketoprofen, aspirin, gabapentin, ibuprofen,  liraglutide, naproxen, omeprazole-sodium bicarbonate, sulfa antibiotics, and tramadol.  Physical Exam:  BP 135/70   Pulse 94   Ht _0  (1.626 m)   Wt 276 lb 6.4 oz (125.4 kg)   LMP 07/16/2020   BMI 47.44 kg/m  Body mass index is 47.44 kg/m. General appearance: Well nourished, well developed female in no acute distress.  Neuro/Psych:  Normal mood and affect.    Assessment: pt stable  Plan: I told her that, at this time, she is a very poor surgical candidate and to avoid surgery at all costs due to the only option left for her is a hysterectomy. If she has 5d of qmonth bleeding that isn't too heavy or bad then I would recommend continuing on the current course even if it means PRN monthly narcotic to help with the pain which is most likely due to her adenomyosis and she most likely has asherman's. Aygestin and dilaudid 67m #30 sent in (database checked)  RTC: 2wks for another depo provera. 387mor follow up  ChDurene RomansD Attending Center for [facility]

## 2020-07-27 ENCOUNTER — Other Ambulatory Visit: Payer: Self-pay | Admitting: Nurse Practitioner

## 2020-07-27 ENCOUNTER — Other Ambulatory Visit: Payer: Self-pay

## 2020-07-27 DIAGNOSIS — Z9889 Other specified postprocedural states: Secondary | ICD-10-CM | POA: Insufficient documentation

## 2020-07-27 DIAGNOSIS — G8929 Other chronic pain: Secondary | ICD-10-CM

## 2020-07-27 DIAGNOSIS — I10 Essential (primary) hypertension: Secondary | ICD-10-CM

## 2020-07-27 LAB — BASIC METABOLIC PANEL
BUN/Creatinine Ratio: 9 (ref 9–23)
BUN: 10 mg/dL (ref 6–24)
CO2: 22 mmol/L (ref 20–29)
Calcium: 8.7 mg/dL (ref 8.7–10.2)
Chloride: 102 mmol/L (ref 96–106)
Creatinine, Ser: 1.13 mg/dL — ABNORMAL HIGH (ref 0.57–1.00)
GFR calc Af Amer: 69 mL/min/{1.73_m2} (ref >59)
GFR calc non Af Amer: 60 mL/min/{1.73_m2} (ref >59)
Glucose: 160 mg/dL — ABNORMAL HIGH (ref 65–99)
Potassium: 4 mmol/L (ref 3.5–5.2)
Sodium: 140 mmol/L (ref 134–144)

## 2020-07-27 MED ORDER — HYDROMORPHONE HCL 2 MG PO TABS: 2.0000 mg | ORAL_TABLET | ORAL | 0 refills | Status: DC | PRN

## 2020-07-27 MED ORDER — NORETHINDRONE ACETATE 5 MG PO TABS: 10.0000 mg | ORAL_TABLET | Freq: Four times a day (QID) | ORAL | 0 refills | Status: DC

## 2020-07-27 MED FILL — NORETHINDRONE 5 MG TABLET: 5 | 30 days supply | Qty: 120 | Fill #0

## 2020-07-27 NOTE — Telephone Encounter (Signed)
Pt left message that she is having issues with her Rx.  [facility] pharmacy and confirmed the issue with Rx.  I was informed that the [facility] # has expired for the provider that sent Rx in for Dilaudid.  Message sent to Dr. [name] for advisement.  Notified pt that I will call her back tomorrow in regards to her Rx.  Pt verbalized understanding.   Addison Naegeli, RN  07/27/20

## 2020-07-29 ENCOUNTER — Other Ambulatory Visit: Payer: Self-pay | Admitting: Obstetrics and Gynecology

## 2020-07-29 ENCOUNTER — Ambulatory Visit: Payer: Self-pay | Admitting: Obstetrics and Gynecology

## 2020-07-29 MED ORDER — HYDROMORPHONE HCL 2 MG PO TABS: 2.0000 mg | ORAL_TABLET | ORAL | 0 refills | Status: DC | PRN

## 2020-07-29 NOTE — Telephone Encounter (Addendum)
Called pt and informed her that her medication for Diluadid will be sent to her pharmacy.  If she does not hear from them by 4pm to please send [place] a message.  Pt asked about the medications that [place] prescribed is not on her AVS.  I explained to the patient that it would not be because there AVS is for the date of 07/26/20 and the prescription was sent on 07/27/20.  Pt verbalized understanding.    Addison Naegeli, RN  07/29/20

## 2020-07-29 NOTE — Progress Notes (Signed)
Rx sent to pharmacy per request of Dr. [name].

## 2020-07-29 NOTE — Addendum Note (Signed)
Addended by: Hermina Staggers on: 07/29/2020 04:59 PM   Modules accepted: Orders

## 2020-07-29 NOTE — Addendum Note (Signed)
Addended by: Faythe Casa on: 07/29/2020 01:25 PM   Modules accepted: Orders

## 2020-07-30 ENCOUNTER — Other Ambulatory Visit: Payer: Self-pay | Admitting: Family Medicine

## 2020-07-30 ENCOUNTER — Telehealth: Payer: Self-pay | Admitting: Obstetrics and Gynecology

## 2020-07-30 DIAGNOSIS — G8929 Other chronic pain: Secondary | ICD-10-CM

## 2020-07-30 MED ORDER — HYDROMORPHONE HCL 2 MG PO TABS: 2.0000 mg | ORAL_TABLET | ORAL | 0 refills | Status: DC | PRN

## 2020-07-30 NOTE — Telephone Encounter (Signed)
Patient is requesting a call back. She has questions about her Rx being sent to the wrong place. Only her doctor can change it.

## 2020-08-02 ENCOUNTER — Telehealth (INDEPENDENT_AMBULATORY_CARE_PROVIDER_SITE_OTHER): Payer: Medicaid Other | Admitting: Nurse Practitioner

## 2020-08-02 ENCOUNTER — Telehealth: Payer: Self-pay | Admitting: Cardiovascular Disease

## 2020-08-02 ENCOUNTER — Other Ambulatory Visit: Payer: Self-pay

## 2020-08-02 ENCOUNTER — Other Ambulatory Visit: Payer: Self-pay | Admitting: Nurse Practitioner

## 2020-08-02 ENCOUNTER — Telehealth (INDEPENDENT_AMBULATORY_CARE_PROVIDER_SITE_OTHER): Payer: Self-pay | Admitting: Lactation Services

## 2020-08-02 DIAGNOSIS — E1122 Type 2 diabetes mellitus with diabetic chronic kidney disease: Secondary | ICD-10-CM

## 2020-08-02 DIAGNOSIS — IMO0002 Reserved for concepts with insufficient information to code with codable children: Secondary | ICD-10-CM

## 2020-08-02 DIAGNOSIS — E1165 Type 2 diabetes mellitus with hyperglycemia: Secondary | ICD-10-CM

## 2020-08-02 DIAGNOSIS — R1310 Dysphagia, unspecified: Secondary | ICD-10-CM | POA: Diagnosis not present

## 2020-08-02 DIAGNOSIS — N939 Abnormal uterine and vaginal bleeding, unspecified: Secondary | ICD-10-CM

## 2020-08-02 NOTE — Telephone Encounter (Signed)
Returned patients call. Patient reports she had questions about what Dr. [name] wrote in her noted in regards to not being a good surgical candidate and needing to return for Depo. Advised patient that all that information is in her notes from 10/11. Patient voiced understanding.

## 2020-08-02 NOTE — Telephone Encounter (Signed)
Called the patient back to let her know that there was no documentation that our office called her today. She verbalized understanding and I let her know that someone would call back if they needed to.

## 2020-08-02 NOTE — Telephone Encounter (Signed)
Received message from voicemail from 07/30/20 am from [facility] that Dilaudid RX presriber DEA was expired- need new rx sent in.  Also received message from Voicemail from 07/30/20 am from [facility] that patient called them about Dilaudid RX that they couldn't fill that was sent to [facility] and issue with prescriber DEA expired, etc. Patient upset. I called [facility] today and they verified the issue was taken care of by the on call physician , RX sent in and patient has picked it up.  Syd Newsome,RN

## 2020-08-02 NOTE — Progress Notes (Unsigned)
   [facility] [address] , [place]  [address] Phone:  [phone]   Fax:  [phone] 

## 2020-08-02 NOTE — Telephone Encounter (Signed)
Follow Up:     Pt is returning a call  From today. She said she did not know it was who called her

## 2020-08-02 NOTE — Telephone Encounter (Signed)
Please sed patient inquiry.

## 2020-08-05 ENCOUNTER — Ambulatory Visit: Payer: Self-pay | Admitting: Cardiology

## 2020-08-06 MED FILL — BACLOFEN 10 MG TABLET: 10 | 10 days supply | Qty: 30 | Fill #1

## 2020-08-06 NOTE — Progress Notes (Signed)
[facility] Patient [facility] [name] [place], [place]  [address] Phone:  [phone]   Fax:  [phone] Virtual Visit via Telephone Note  I connected with Jill Shaw on 08/07/20 at  2:40 PM EDT by telephone and verified that I am speaking with the correct person using two identifiers.   I discussed the limitations, risks, security and privacy concerns of performing an evaluation and management service by telephone and the availability of in person appointments. I also discussed with the patient that there may be a patient responsible charge related to this service. The patient expressed understanding and agreed to proceed.  Patient home Provider Office  History of Present Illness: Diabetes Mellitus Patient called for for follow up of diabetes.  She was hospitalized on 07/02/2020 for hyperglycemia.  She was diagnosed with DKA blood glucose on admission was greater than 900.  Current symptoms include: hyperglycemia Glucose 2 50-300.  She admits that she was discharged from the hospital with her blood sugars in this range.  Symptoms have gradually worsened. Patient denies foot ulcerations, hypoglycemia , polydipsia, polyuria and vomiting. Evaluation to date has included: fasting blood sugar, fasting lipid panel, hemoglobin A1C and microalbuminuria.  Home sugars: BGs are high in the morning, BGs are high in the evening, BGs are high around dinner, BGs are high at bedtime. Current treatment: Continued insulin which has been not very effective, Continued statin which has been effective, Continued ACE inhibitor/ARB which has been somewhat effective and Continued Onglyza which has been not very effective. Last dilated eye exam: 04/2020. She has been on the lispro Prot & lispro for many years. She has been treated for DM with insulin > 20 yrs. She was diagnosed with gestational diabetes.   Dysphagia Patient complains of difficulty swallowing. The dysphagia occurs with both solids and  liquids Symptoms have been present for approximately a few weeks. The symptoms are unchanged. The dysphagia has been intermittent and has not been progressive.  She reflux.  She denies melena, hematochezia, hematemesis, and coffee ground emesis.  This has been associated with choking on food.  She denies no other symptoms. She is SP EGD a few yrs ago for similar symptoms.     Observations/Objective: No exam due to virtual visit  Assessment and Plan: Assessment  Primary Diagnosis & Pertinent Problem List: The primary encounter diagnosis was Uncontrolled type 2 diabetes mellitus with chronic kidney disease (HCC). A diagnosis of Dysphagia, unspecified type was also pertinent to this visit.  Visit Diagnosis: 1. Uncontrolled type 2 diabetes mellitus with chronic kidney disease (HCC) Encourage compliance with current treatment regimen  Dose adjustment pt declined. She does not want to make any changes in the type of insulin. Encourage regular CBG monitoring, will trial the [facility] since she states that her Medicaid has been reinstated. Will also place a referral to endocrinology for further evaluation.  Encourage contacting office if excessive hyperglycemia and or hypoglycemia Long discussion on diet; she agreed to some of dietary changes to recommended.  Lifestyle modification with healthy diet (fewer calories, more high fiber foods, whole grains and non-starchy vegetables, lower fat meat and fish, low-fat diary include healthy oils) regular exercise (physical activity) and weight loss      2. Dysphagia, unspecified type  GI referral for reevaluation. Continue with small bites discussed risk of aspiration pnx. Encouraged smoking cessation     Follow Up Instructions: Apt as scheduled   I discussed the assessment and treatment plan with the patient. The  patient was provided an opportunity to ask questions and all were answered. The patient agreed with the plan and demonstrated an  understanding of the instructions.   The patient was advised to call back or seek an in-person evaluation if the symptoms worsen or if the condition fails to improve as anticipated.  I provided 17 minutes of non-face-to-face time during this encounter.   Barbette Merino, NP

## 2020-08-07 ENCOUNTER — Encounter: Payer: Self-pay | Admitting: Nurse Practitioner

## 2020-08-07 MED ORDER — FREESTYLE LIBRE 14 DAY READER DEVI: 1.0000 "application " | 11 refills | Status: DC

## 2020-08-07 MED ORDER — FREESTYLE LIBRE 14 DAY SENSOR MISC: 1.0000 "application " | 11 refills | Status: DC

## 2020-08-07 NOTE — Patient Instructions (Signed)
Diabetes Mellitus and Nutrition, Adult When you have diabetes (diabetes mellitus), it is very important to have healthy eating habits because your blood sugar (glucose) levels are greatly affected by what you eat and drink. Eating healthy foods in the appropriate amounts, at about the same times every day, can help you:  Control your blood glucose.  Lower your risk of heart disease.  Improve your blood pressure.  Reach or maintain a healthy weight. Every person with diabetes is different, and each person has different needs for a meal plan. Your health care provider may recommend that you work with a diet and nutrition specialist (dietitian) to make a meal plan that is best for you. Your meal plan may vary depending on factors such as:  The calories you need.  The medicines you take.  Your weight.  Your blood glucose, blood pressure, and cholesterol levels.  Your activity level.  Other health conditions you have, such as heart or kidney disease. How do carbohydrates affect me? Carbohydrates, also called carbs, affect your blood glucose level more than any other type of food. Eating carbs naturally raises the amount of glucose in your blood. Carb counting is a method for keeping track of how many carbs you eat. Counting carbs is important to keep your blood glucose at a healthy level, especially if you use insulin or take certain oral diabetes medicines. It is important to know how many carbs you can safely have in each meal. This is different for every person. Your dietitian can help you calculate how many carbs you should have at each meal and for each snack. Foods that contain carbs include:  Bread, cereal, rice, pasta, and crackers.  Potatoes and corn.  Peas, beans, and lentils.  Milk and yogurt.  Fruit and juice.  Desserts, such as cakes, cookies, ice cream, and candy. How does alcohol affect me? Alcohol can cause a sudden decrease in blood glucose (hypoglycemia),  especially if you use insulin or take certain oral diabetes medicines. Hypoglycemia can be a life-threatening condition. Symptoms of hypoglycemia (sleepiness, dizziness, and confusion) are similar to symptoms of having too much alcohol. If your health care provider says that alcohol is safe for you, follow these guidelines:  Limit alcohol intake to no more than 1 drink per day for nonpregnant women and 2 drinks per day for men. One drink equals 12 oz of beer, 5 oz of wine, or 1 oz of hard liquor.  Do not drink on an empty stomach.  Keep yourself hydrated with water, diet soda, or unsweetened iced tea.  Keep in mind that regular soda, juice, and other mixers may contain a lot of sugar and must be counted as carbs. What are tips for following this plan?  Reading food labels  Start by checking the serving size on the "Nutrition Facts" label of packaged foods and drinks. The amount of calories, carbs, fats, and other nutrients listed on the label is based on one serving of the item. Many items contain more than one serving per package.  Check the total grams (g) of carbs in one serving. You can calculate the number of servings of carbs in one serving by dividing the total carbs by 15. For example, if a food has 30 g of total carbs, it would be equal to 2 servings of carbs.  Check the number of grams (g) of saturated and trans fats in one serving. Choose foods that have low or no amount of these fats.  Check the number of   milligrams (mg) of salt (sodium) in one serving. Most people should limit total sodium intake to less than 2,300 mg per day.  Always check the nutrition information of foods labeled as "low-fat" or "nonfat". These foods may be higher in added sugar or refined carbs and should be avoided.  Talk to your dietitian to identify your daily goals for nutrients listed on the label. Shopping  Avoid buying canned, premade, or processed foods. These foods tend to be high in fat, sodium,  and added sugar.  Shop around the outside edge of the grocery store. This includes fresh fruits and vegetables, bulk grains, fresh meats, and fresh dairy. Cooking  Use low-heat cooking methods, such as baking, instead of high-heat cooking methods like deep frying.  Cook using healthy oils, such as olive, canola, or sunflower oil.  Avoid cooking with butter, cream, or high-fat meats. Meal planning  Eat meals and snacks regularly, preferably at the same times every day. Avoid going long periods of time without eating.  Eat foods high in fiber, such as fresh fruits, vegetables, beans, and whole grains. Talk to your dietitian about how many servings of carbs you can eat at each meal.  Eat 4-6 ounces (oz) of lean protein each day, such as lean meat, chicken, fish, eggs, or tofu. One oz of lean protein is equal to: ? 1 oz of meat, chicken, or fish. ? 1 egg. ?  cup of tofu.  Eat some foods each day that contain healthy fats, such as avocado, nuts, seeds, and fish. Lifestyle  Check your blood glucose regularly.  Exercise regularly as told by your health care provider. This may include: ? 150 minutes of moderate-intensity or vigorous-intensity exercise each week. This could be brisk walking, biking, or water aerobics. ? Stretching and doing strength exercises, such as yoga or weightlifting, at least 2 times a week.  Take medicines as told by your health care provider.  Do not use any products that contain nicotine or tobacco, such as cigarettes and e-cigarettes. If you need help quitting, ask your health care provider.  Work with a Veterinary surgeon or diabetes educator to identify strategies to manage stress and any emotional and social challenges. Questions to ask a health care provider  Do I need to meet with a diabetes educator?  Do I need to meet with a dietitian?  What number can I call if I have questions?  When are the best times to check my blood glucose? Where to find more  information:  American Diabetes Association: diabetes.org  Academy of Nutrition and Dietetics: www.eatright.[employer] of Diabetes and Digestive and Kidney Diseases (NIH): CarFlippers.tn Summary  A healthy meal plan will help you control your blood glucose and maintain a healthy lifestyle.  Working with a diet and nutrition specialist (dietitian) can help you make a meal plan that is best for you.  Keep in mind that carbohydrates (carbs) and alcohol have immediate effects on your blood glucose levels. It is important to count carbs and to use alcohol carefully. This information is not intended to replace advice given to you by your health care provider. Make sure you discuss any questions you have with your health care provider. Document Revised: 09/14/2017 Document Reviewed: 11/06/2016 Elsevier Patient Education  2020 Else[employer] Inc. Dysphagia  Dysphagia is trouble swallowing. This condition occurs when solids and liquids stick in a person's throat on the way down to the stomach, or when food takes longer to get to the stomach than usual. You may  have problems swallowing food, liquids, or both. You may also have pain while trying to swallow. It may take you more time and effort to swallow something. What are the causes? This condition may be caused by:  Muscle problems. They may make it difficult for you to move food and liquids through the esophagus, which is the tube that connects your mouth to your stomach.  Blockages. You may have ulcers, scar tissue, or inflammation that blocks the normal passage of food and liquids. Causes of these problems include: ? Acid reflux from your stomach into your esophagus (gastroesophageal reflux). ? Infections. ? Radiation treatment for cancer. ? Medicines taken without enough fluids to wash them down into your stomach.  Stroke. This can affect the nerves and make it difficult to swallow.  Nerve problems. These prevent signals from  being sent to the muscles of your esophagus to squeeze (contract) and move what you swallow down to your stomach.  Globus pharyngeus. This is a common problem that involves a feeling like something is stuck in your throat or a sense of trouble with swallowing, even though nothing is wrong with the swallowing passages.  Certain conditions, such as cerebral palsy or Parkinson's disease. What are the signs or symptoms? Common symptoms of this condition include:  A feeling that solids or liquids are stuck in your throat on the way down to the stomach.  Pain while swallowing.  Coughing or gagging while trying to swallow. Other symptoms include:  Food moving back from your stomach to your mouth (regurgitation).  Noises coming from your throat.  Chest discomfort with swallowing.  A feeling of fullness when swallowing.  Drooling, especially when the throat is blocked.  Heartburn. How is this diagnosed? This condition may be diagnosed by:  Barium X-ray. In this test, you will swallow a white liquid that sticks to the inside of your esophagus. X-ray images are then taken.  Endoscopy. In this test, a flexible telescope is inserted down your throat to look at your esophagus and your stomach.  CT scans and an MRI. How is this treated? Treatment for dysphagia depends on the cause of this condition, such as:  If the dysphagia is caused by acid reflux or infection, medicines may be used. They may include antibiotics and heartburn medicines.  If the dysphagia is caused by problems with the muscles, swallowing therapy may be used to help you strengthen your swallowing muscles. You may have to do specific exercises to strengthen the muscles or stretch them.  If the dysphagia is caused by a blockage or mass, procedures to remove the blockage may be done. You may need surgery and a feeding tube. You may need to make diet changes. Ask your health care provider for specific instructions. Follow  these instructions at home: Medicines  Take over-the-counter and prescription medicines only as told by your health care provider.  If you were prescribed an antibiotic medicine, take it as told by your health care provider. Do not stop taking the antibiotic even if you start to feel better. Eating and drinking   Follow any diet changes as told by your health care provider.  Work with a diet and nutrition specialist (dietitian) to create an eating plan that will help you get the nutrients you need in order to stay healthy.  Eat soft foods that are easier to swallow.  Cut your food into small pieces and eat slowly. Take small bites.  Eat and drink only when you are sitting upright.  Do  not drink alcohol or caffeine. If you need help quitting, ask your health care provider. General instructions  Check your weight every day to make sure you are not losing weight.  Do not use any products that contain nicotine or tobacco, such as cigarettes, e-cigarettes, and chewing tobacco. If you need help quitting, ask your health care provider.  Keep all follow-up visits as told by your health care provider. This is important. Contact a health care provider if you:  Lose weight because you cannot swallow.  Cough when you drink liquids.  Cough up partially digested food. Get help right away if you:  Cannot swallow your saliva.  Have shortness of breath, a fever, or both.  Have a hoarse voice and also have trouble swallowing. Summary  Dysphagia is trouble swallowing. This condition occurs when solids and liquids stick in a person's throat on the way down to the stomach. You may cough or gag while trying to swallow.  Dysphagia has many possible causes.  Treatment for dysphagia depends on the cause of the condition.  Keep all follow-up visits as told by your health care provider. This is important. This information is not intended to replace advice given to you by your health care  provider. Make sure you discuss any questions you have with your health care provider. Document Revised: 02/26/2019 Document Reviewed: 02/26/2019 Elsevier Patient Education  2020 [employer].

## 2020-08-07 NOTE — Addendum Note (Signed)
Addended by: Barbette Merino on: 08/07/2020 07:13 AM   Modules accepted: Orders

## 2020-08-10 ENCOUNTER — Encounter: Payer: Self-pay | Admitting: Family Medicine

## 2020-08-10 ENCOUNTER — Encounter: Payer: Self-pay | Admitting: Podiatry

## 2020-08-11 ENCOUNTER — Other Ambulatory Visit: Payer: Self-pay

## 2020-08-11 ENCOUNTER — Encounter: Payer: Self-pay | Admitting: *Deleted

## 2020-08-11 ENCOUNTER — Telehealth: Payer: Self-pay

## 2020-08-11 ENCOUNTER — Ambulatory Visit (INDEPENDENT_AMBULATORY_CARE_PROVIDER_SITE_OTHER): Payer: Medicaid Other | Admitting: *Deleted

## 2020-08-11 VITALS — BP 178/86 | HR 102 | Ht 64.0 in | Wt 269.8 lb

## 2020-08-11 DIAGNOSIS — N939 Abnormal uterine and vaginal bleeding, unspecified: Secondary | ICD-10-CM

## 2020-08-11 DIAGNOSIS — R531 Weakness: Secondary | ICD-10-CM

## 2020-08-11 DIAGNOSIS — E1140 Type 2 diabetes mellitus with diabetic neuropathy, unspecified: Secondary | ICD-10-CM

## 2020-08-11 DIAGNOSIS — Z794 Long term (current) use of insulin: Secondary | ICD-10-CM

## 2020-08-11 DIAGNOSIS — G629 Polyneuropathy, unspecified: Secondary | ICD-10-CM

## 2020-08-11 DIAGNOSIS — I10 Essential (primary) hypertension: Secondary | ICD-10-CM

## 2020-08-11 MED ORDER — ROSUVASTATIN CALCIUM 5 MG PO TABS
5.0000 mg | ORAL_TABLET | Freq: Every day | ORAL | 3 refills | Status: DC
Start: 2020-08-11 — End: 2021-07-07

## 2020-08-11 MED ORDER — METOPROLOL SUCCINATE ER 100 MG PO TB24
100.0000 mg | ORAL_TABLET | Freq: Every day | ORAL | 3 refills | Status: DC
Start: 2020-08-11 — End: 2020-10-14

## 2020-08-11 MED ORDER — MEDROXYPROGESTERONE ACETATE 150 MG/ML IM SUSP
150.0000 mg | Freq: Once | INTRAMUSCULAR | Status: AC
  Administered 2020-08-11: 150 mg via INTRAMUSCULAR

## 2020-08-11 MED ORDER — LOSARTAN POTASSIUM 50 MG PO TABS: 50.0000 mg | ORAL_TABLET | Freq: Every day | ORAL | 2 refills | Status: DC

## 2020-08-11 MED ORDER — FUROSEMIDE 40 MG PO TABS: 40.0000 mg | ORAL_TABLET | Freq: Two times a day (BID) | ORAL | 5 refills | Status: DC

## 2020-08-11 MED ORDER — POTASSIUM CHLORIDE CRYS ER 20 MEQ PO TBCR: 20.0000 meq | EXTENDED_RELEASE_TABLET | Freq: Every day | ORAL | 3 refills | Status: DC

## 2020-08-11 NOTE — Telephone Encounter (Signed)
She may be mistaken because I am not writing any of her medications, and she needs to contact her respective providers or PCP for refills.  My last note indicates that she is already established with pain management, but she was waiting for her medicaid to finalize. If she needs a referral, please send to [facility]. Thanks

## 2020-08-11 NOTE — Telephone Encounter (Signed)
Patient is requesting pain management referral and refill on all medications.

## 2020-08-11 NOTE — Progress Notes (Signed)
Depo Provera 150 mg IM administered as scheduled. Pt tolerated well. Next injection due 1/12-1/26/22. Pt states she took last dose of Aygestin 3 days ago and needs refill. Pt states that her pharmacy has informed her that the prescription needs to be changed as the insurance company will not approve the quantity prescribed. She reports having heavy bleeding x3 days since she has been without the medication. She requests blood test today to check her iron level. Yesterday she felt weak and dizzy - called EMS who came to her house and took vitals. She did not require visit to the hospital. I called [facility] pharmacy and was informed that pt's insurance will cover 3 tabs daily of Norethindrone 5mg . Anything more than that will require pre-authorization. Dr. will be notified. Pt was offered [facility] appt d/t PHQ-9 score 10, however she declined. Pt was made aware that she may schedule [facility] appt in the future if desired. She voiced understanding of all information and instructions given.

## 2020-08-11 NOTE — Telephone Encounter (Signed)
Sent a mychart message to patient to inform.

## 2020-08-12 ENCOUNTER — Telehealth: Payer: Self-pay | Admitting: General Practice

## 2020-08-12 ENCOUNTER — Other Ambulatory Visit: Payer: Self-pay | Admitting: Obstetrics and Gynecology

## 2020-08-12 DIAGNOSIS — N939 Abnormal uterine and vaginal bleeding, unspecified: Secondary | ICD-10-CM

## 2020-08-12 LAB — CBC
Hematocrit: 34.7 % (ref 34.0–46.6)
Hemoglobin: 9.5 g/dL — ABNORMAL LOW (ref 11.1–15.9)
MCH: 19.2 pg — ABNORMAL LOW (ref 26.6–33.0)
MCHC: 27.4 g/dL — ABNORMAL LOW (ref 31.5–35.7)
MCV: 70 fL — ABNORMAL LOW (ref 79–97)
Platelets: 311 10*3/uL (ref 150–450)
RBC: 4.96 x10E6/uL (ref 3.77–5.28)
RDW: 23.1 % — ABNORMAL HIGH (ref 11.7–15.4)
WBC: 13 10*3/uL — ABNORMAL HIGH (ref 3.4–10.8)

## 2020-08-12 MED ORDER — NORETHINDRONE ACETATE 5 MG PO TABS: 10.0000 mg | ORAL_TABLET | Freq: Four times a day (QID) | ORAL | 0 refills | Status: DC

## 2020-08-12 NOTE — Addendum Note (Signed)
Addended by: Karl Luke A on: 08/12/2020 09:53 AM   Modules accepted: Orders

## 2020-08-12 NOTE — Telephone Encounter (Signed)
Message left on patient's voicemail to call office.  Message also sent to patient through my chart

## 2020-08-12 NOTE — Telephone Encounter (Signed)
Called patient regarding medication. Reviewed with patient Dr [name] wants her on that specific medication and dosing which her insurance does not want to cover. Told patient I was unable to get anywhere with a prior auth. Discussed goodrx offers the prescription for $30 or she may can get it filled cheaper with [facility] since insurance isn't wanting to cover it. Patient verbalized understanding and states what about a different dose. She states she doesn't want to take 8 pills a day because that is too much on her stomach. Told patient that unfortunately that prescription only comes in that dose so that is the only option. Patient verbalized understanding & requests Rx be transferred back to [facility]. Rx placed.

## 2020-08-13 ENCOUNTER — Telehealth: Payer: Self-pay | Admitting: Pulmonary Disease

## 2020-08-13 ENCOUNTER — Other Ambulatory Visit: Payer: Self-pay | Admitting: Nurse Practitioner

## 2020-08-13 DIAGNOSIS — E119 Type 2 diabetes mellitus without complications: Secondary | ICD-10-CM

## 2020-08-13 MED ORDER — FREESTYLE LIBRE 14 DAY SENSOR MISC: 1.0000 | 11 refills | Status: AC

## 2020-08-13 MED ORDER — INSULIN LISPRO PROT & LISPRO (75-25 MIX) 100 UNIT/ML KWIKPEN: PEN_INJECTOR | SUBCUTANEOUS | 11 refills | Status: DC

## 2020-08-13 MED ORDER — FREESTYLE LIBRE 14 DAY READER DEVI: 1.0000 "application " | 0 refills | Status: DC

## 2020-08-13 MED FILL — NORETHINDRONE 5 MG TABLET: 5 | 15 days supply | Qty: 90 | Fill #0

## 2020-08-13 NOTE — Telephone Encounter (Signed)
Spoke to patient in lobby. She stated that she had an OV on 07/12/20 with Arlys John where she completed a walk. She stated that even though she did not desat during her walk, she still feels she needs O2. She lives in an upstairs apartment and she is constantly out of breath by the time she reaches her floor.   She wanted to know if we could still send an order for a POC. She recently qualified for Medicaid and wonders if this will help. I explained to her that Medicaid will not pay for O2 until her sats reach and remain under 88%.   I did tell her that if she wanted the O2 without qualifying for it, she could contact [employer]. She declined as she wants Medicaid to pay for it.   Arlys John, please advise.

## 2020-08-13 NOTE — Telephone Encounter (Signed)
She can contact her Medicaid caseworker and see if they have any other suggestions.  There is no way for her to qualify for oxygen if she has no documented desaturations.  Being short of breath is not an automatic qualify for oxygen.  We had reviewed this with the patient at that office visit.  She can review this at next office visit with Dr. Tonia Brooms if she continues have questions or concerns.Elisha Headland, FNP

## 2020-08-13 NOTE — Telephone Encounter (Signed)
LMTCB for the pt 

## 2020-08-13 NOTE — Progress Notes (Signed)
Patient was assessed and managed by nursing staff during this encounter. I have reviewed the chart and agree with the documentation and plan. I have also made any necessary editorial changes.  Burdette Bing, MD 08/13/2020 9:14 AM

## 2020-08-16 ENCOUNTER — Ambulatory Visit (HOSPITAL_COMMUNITY)
Admission: EM | Admit: 2020-08-16 | Discharge: 2020-08-16 | Disposition: A | Discharge status: Home / Self Care | Payer: Medicaid Other | Attending: Family Medicine | Admitting: Family Medicine

## 2020-08-16 ENCOUNTER — Encounter (HOSPITAL_COMMUNITY): Payer: Self-pay

## 2020-08-16 ENCOUNTER — Other Ambulatory Visit: Payer: Self-pay

## 2020-08-16 DIAGNOSIS — R21 Rash and other nonspecific skin eruption: Secondary | ICD-10-CM

## 2020-08-16 MED ORDER — TRIAMCINOLONE ACETONIDE 0.025 % EX CREA: 1.0000 "application " | TOPICAL_CREAM | Freq: Two times a day (BID) | CUTANEOUS | 0 refills | Status: DC

## 2020-08-16 MED ORDER — CETIRIZINE HCL 10 MG PO TABS: 10.0000 mg | ORAL_TABLET | Freq: Every day | ORAL | 0 refills | Status: DC

## 2020-08-16 NOTE — ED Triage Notes (Signed)
Pt in with c/o of "rash" on left side of face that has been going on for a few months.  States that "it itches and burns. Her whole body itches but the rash only appears on face".  Pt has not had any medication for sxs

## 2020-08-16 NOTE — Discharge Instructions (Signed)
Take Zyrtec every day.  This is your allergy pill Use the triamcinolone cream sparingly.  A small amount across her cheeks once or twice a day. Do not use for more than 4 weeks Refills need to come from your primary care doctor See dermatology in follow-up

## 2020-08-16 NOTE — ED Provider Notes (Signed)
[name]    CSN: [id number] Arrival date & time: 08/16/20  1508      History   Chief Complaint Chief Complaint  Patient presents with  . skin irritation    HPI Jill Shaw is a 44 y.o. female.   HPI Patient is a nonspecific rash across her malar area that she states stings and itches.  It burns.  She has not been using any  different soap lotion powder or product.  Its been going on for months.  She is hoping for dermatology referral.  She understands this has to come from her primary care doctor. She has not been diagnosed with eczema.  Rosacea.  Any chronic skin condition Patient is a diabetic.  She has acanthosis nigricans that is deeply pigmented around the back of her neck. She takes Zyrtec for allergies.  States she needs an refill of this medication Patient has multiple medical problems including hypertension, diabetes, asthma, chronic pain, dysfunctional uterine bleeding, morbid obesity Past Medical History:  Diagnosis Date  . Anemia   . Arthritis    knees, hands  . Asthma   . Chronic diastolic (congestive) heart failure ([place])   . COPD (chronic obstructive pulmonary disease) ([place])   . Diabetes mellitus without complication ([place])    type 2  . Dysfunctional uterine bleeding   . GERD (gastroesophageal reflux disease)   . Hypertension   . Neuromuscular disorder (HCC)    neuropathy feet  . Seizures ([place]) 09/12/2017   pt states r/t stress and blood sugar - no meds last one 4 months ago, not seen neurologist  . Sickle cell trait ([place])   . Smoker   . Vitamin D deficiency 10/2019  . Wears glasses     Patient Active Problem List   Diagnosis Date Noted  . History of endometrial ablation 07/27/2020  . History of alcohol use 07/20/2020  . Current moderate episode of major depressive disorder without prior episode ([place]) 07/20/2020  . Sickle cell trait ([place]) 07/19/2020  . Elevated d-dimer 07/13/2020  . Healthcare maintenance 07/12/2020  . Shortness  of breath 07/12/2020  . Atypical chest pain 07/12/2020  . At risk for obstructive sleep apnea 07/12/2020  . DKA (diabetic ketoacidoses) 07/02/2020  . Acute on chronic diastolic (congestive) heart failure ([place]) 06/22/2020  . Abdominal pain 03/08/2020  . Chronic diastolic CHF (congestive heart failure) ([place]) 03/08/2020  . AKI (acute kidney injury) ([place]) 01/29/2020  . Symptomatic anemia 12/19/2019  . Elevated sed rate 11/26/2019  . Elevated C-reactive protein (CRP) 11/26/2019  . Hypoglycemia 02/07/2019  . Hypotension 02/07/2019  . Lactic acidosis 02/07/2019  . Right ankle pain 02/07/2019  . Chronic obstructive pulmonary disease ([place]) 02/05/2019  . Seizure ([place]) 07/31/2018  . Vitamin D deficiency 05/31/2017  . Gastroesophageal reflux disease 05/24/2017  . Abnormal uterine bleeding (AUB) 05/17/2017  . Anemia of chronic disease 04/06/2017  . Knee pain, chronic 03/27/2016  . Type 2 diabetes mellitus without complication, with long-term current use of insulin ([place]) 03/27/2016  . Essential hypertension 03/27/2016  . Morbid obesity ([place]) 03/27/2016  . Irritable bowel syndrome with constipation 03/27/2016  . Tobacco dependence 03/27/2016  . CKD (chronic kidney disease) stage 2, GFR 60-89 ml/min 03/25/2016  . Arthropathy, lower leg 05/04/2013  . CTS (carpal tunnel syndrome) 05/04/2013  . Peripheral edema 05/04/2013  . Chronic pain 05/13/2012  . Diabetic gastroparesis ([place]) 11/06/2006  . Hot flashes 11/06/2006    Past Surgical History:  Procedure Laterality Date  . CESAREAN SECTION  x 1. for twins  . DILATION AND CURETTAGE OF UTERUS N/A 08/20/2019   Procedure: DILATATION AND CURETTAGE;  Surgeon: Emily Filbert, MD;  Location: [facility];  Service: Gynecology;  Laterality: N/A;  . ENDOMETRIAL ABLATION N/A 08/20/2019   Procedure: [place] Ablation;  Surgeon: Emily Filbert, MD;  Location: [facility];  Service: Gynecology;  Laterality: N/A;  . EYE SURGERY Bilateral    laser right and cataract removed  left eye  . RADIOLOGY WITH ANESTHESIA N/A 09/16/2019   Procedure: MRI WITH ANESTHESIA   L SPINE WITHOUT CONTRAST, T SPINE WITHOUT CONTRAST , CERVICAL WITHOUT CONTRAST;  Surgeon: Radiologist, Medication, MD;  Location: [facility];  Service: Radiology;  Laterality: N/A;  . TUBAL LIGATION     interval BTL  . UPPER GI ENDOSCOPY  07/2017    OB History    Gravida  3   Para      Term      Preterm      AB      Living  3     SAB      TAB      Ectopic      Multiple      Live Births  3        Obstetric Comments  Vaginal x 1 and then c-section for twins         Home Medications    Prior to Admission medications   Medication Sig Start Date End Date Taking? Authorizing Provider  albuterol (PROVENTIL) (2.5 MG/3ML) 0.083% nebulizer solution Take 3 mLs (2.5 mg total) by nebulization every 6 (six) hours as needed for wheezing or shortness of breath. 12/02/19  Yes Vevelyn Francois, NP  albuterol (VENTOLIN HFA) 108 (90 Base) MCG/ACT inhaler Inhale 2 puffs into the lungs every 6 (six) hours as needed for wheezing or shortness of breath. 12/02/19  Yes Vevelyn Francois, NP  buPROPion (WELLBUTRIN SR) 150 MG 12 hr tablet Take 1 tablet (150 mg total) by mouth 2 (two) times daily. 07/19/20 07/19/21 Yes Vevelyn Francois, NP  cetirizine (ZYRTEC) 10 MG tablet Take 1 tablet (10 mg total) by mouth daily. 12/02/19  Yes Vevelyn Francois, NP  ferrous sulfate 325 (65 FE) MG tablet Take 1 tablet (325 mg total) by mouth 3 (three) times daily with meals. Patient taking differently: Take 325 mg by mouth 2 (two) times daily with a meal.  12/02/19  Yes Vevelyn Francois, NP  fluticasone (FLONASE) 50 MCG/ACT nasal spray Place 2 sprays into both nostrils daily. 11/24/19  Yes Vevelyn Francois, NP  furosemide (LASIX) 40 MG tablet Take 1 tablet (40 mg total) by mouth 2 (two) times daily. 08/11/20  Yes Lorretta Harp, MD  Glucosamine Sulfate 1000 MG CAPS Take 1 capsule (1,000 mg total) by mouth 2 (two) times daily. 08/22/19  Yes  Hilts, Legrand Como, MD  HYDROmorphone (DILAUDID) 2 MG tablet Take 1-2 tablets (2-4 mg total) by mouth every 4 (four) hours as needed for severe pain. 07/30/20  Yes Donnamae Jude, MD  Insulin Lispro Prot & Lispro (HUMALOG MIX 75/25 KWIKPEN) (75-25) 100 UNIT/ML Kwikpen INJECT 110 UNITS EVERY 12 HOURS 08/13/20  Yes Vevelyn Francois, NP  losartan (COZAAR) 50 MG tablet Take 1 tablet (50 mg total) by mouth daily. 08/11/20  Yes Lorretta Harp, MD  metoCLOPramide (REGLAN) 10 MG tablet Take 1 tablet (10 mg total) by mouth 4 (four) times daily -  before meals and at bedtime. 07/04/20  Yes Bonnielee Haff, MD  metoprolol succinate (TOPROL-XL) 100 MG 24 hr tablet Take 1 tablet (100 mg total) by mouth daily. Take with or immediately following a meal. 08/11/20  Yes Lorretta Harp, MD  nicotine (NICODERM CQ) 21 mg/24hr patch Place 1 patch (21 mg total) onto the skin daily. 07/12/20  Yes Lauraine Rinne, NP  omeprazole (PRILOSEC) 40 MG capsule TAKE 1 CAPSULE (40 MG TOTAL) BY MOUTH DAILY. 07/02/20  Yes King, Diona Foley, NP  potassium chloride SA (KLOR-CON) 20 MEQ tablet Take 1 tablet (20 mEq total) by mouth daily. 08/11/20  Yes Lorretta Harp, MD  promethazine (PHENERGAN) 12.5 MG tablet Take 1 tablet (12.5 mg total) by mouth every 6 (six) hours as needed for nausea or vomiting. 03/10/20  Yes Patrecia Pour, MD  rosuvastatin (CRESTOR) 5 MG tablet Take 1 tablet (5 mg total) by mouth daily. 08/11/20  Yes Lorretta Harp, MD  saxagliptin HCl (ONGLYZA) 2.5 MG TABS tablet Take 1 tablet (2.5 mg total) by mouth daily. 12/02/19  Yes Vevelyn Francois, NP  sucralfate (CARAFATE) 1 GM/10ML suspension Take 10 mLs (1 g total) by mouth 4 (four) times daily -  with meals and at bedtime. 07/04/20  Yes Bonnielee Haff, MD  Tiotropium Bromide Monohydrate (SPIRIVA RESPIMAT) 2.5 MCG/ACT AERS Inhale 2 puffs into the lungs daily. 04/09/20  Yes Martyn Ehrich, NP  tizanidine (ZANAFLEX) 6 MG capsule Take 1 capsule (6 mg total) by mouth 3 (three)  times daily. 07/22/20 10/20/20 Yes King, Diona Foley, NP  Turmeric 500 MG CAPS Take 500 mg by mouth 2 (two) times daily. 08/22/19  Yes Hilts, Legrand Como, MD  Vitamin D, Ergocalciferol, (DRISDOL) 1.25 MG (50000 UNIT) CAPS capsule Take 1 capsule (50,000 Units total) by mouth every 7 (seven) days. Patient taking differently: Take 50,000 Units by mouth every Friday.  12/02/19  Yes Vevelyn Francois, NP  blood glucose meter kit and supplies KIT Dispense based on patient and insurance preference. Use up to four times daily as directed. (FOR ICD-9 250.00, 250.01). Patient taking differently: 1 each by Other route See admin instructions. Dispense based on patient and insurance preference. Use up to four times daily as directed. (FOR ICD-9 250.00, 250.01). 02/12/20   Vevelyn Francois, NP  Blood Glucose Monitoring Suppl (TRUE METRIX METER) w/Device KIT 1 each by Does not apply route 4 (four) times daily -  before meals and at bedtime. 01/21/18   Dorena Dew, FNP  budesonide-formoterol (SYMBICORT) 160-4.5 MCG/ACT inhaler INHALE 2 PUFFS INTO THE LUNGS 2 (TWO) TIMES DAILY. Patient taking differently: Inhale 2 puffs into the lungs 2 (two) times daily.  12/02/19   Vevelyn Francois, NP  cetirizine (ZYRTEC) 10 MG tablet Take 1 tablet (10 mg total) by mouth daily. 08/16/20   Raylene Everts, MD  Cholecalciferol (VITAMIN D-3) 125 MCG (5000 UT) TABS Take 1 tablet by mouth daily. Patient taking differently: Take 5,000 Units by mouth daily.  08/22/19   Hilts, Michael, MD  Continuous Blood Gluc Receiver (FREESTYLE LIBRE 14 DAY READER) DEVI 1 application by Does not apply route every 14 (fourteen) days. 08/13/20 08/13/21  Vevelyn Francois, NP  Continuous Blood Gluc Sensor (FREESTYLE LIBRE 14 DAY SENSOR) MISC Inject 1 applicator into the skin every 14 (fourteen) days for 14 days. 08/13/20 08/27/20  Vevelyn Francois, NP  diclofenac (VOLTAREN) 75 MG EC tablet Take 75 mg by mouth 2 (two) times daily as needed. 07/06/20   [provider]  glucose blood (TRUE METRIX BLOOD GLUCOSE  TEST) test strip Use as instructed 07/19/20   Vevelyn Francois, NP  hydroquinone 4 % cream Apply topically 2 (two) times daily. 05/07/20   Vevelyn Francois, NP  Insulin Pen Needle (PEN NEEDLES) 30G X 5 MM MISC 1 Units by Does not apply route as directed. 12/02/19   Vevelyn Francois, NP  nicotine polacrilex (COMMIT) 4 MG lozenge Take 1 lozenge (4 mg total) by mouth as needed for smoking cessation. 07/12/20   Lauraine Rinne, NP  norethindrone (AYGESTIN) 5 MG tablet Take 2 tablets (10 mg total) by mouth in the morning, at noon, in the evening, and at bedtime. With bleeding 08/12/20   Aletha Halim, MD  triamcinolone (KENALOG) 0.025 % cream Apply 1 application topically 2 (two) times daily. 08/16/20   Raylene Everts, MD  TRUEplus Lancets 28G MISC 1 each by Other route in the morning, at noon, in the evening, and at bedtime. 02/12/20   [provider]  hydrALAZINE (APRESOLINE) 50 MG tablet Take 1 tablet (50 mg total) by mouth 3 (three) times daily. 02/03/20 02/20/20  Thurnell Lose, MD    Family History Family History  Problem Relation Age of Onset  . Diabetes Mother   . Hypertension Mother     Social History Social History   Tobacco Use  . Smoking status: Current Every Day Smoker    Packs/day: 0.25    Years: 26.00    Pack years: 6.50    Types: Cigarettes  . Smokeless tobacco: Never Used  . Tobacco comment: 4-5 cigarettes/day  Vaping Use  . Vaping Use: Never used  Substance Use Topics  . Alcohol use: No  . Drug use: No     Allergies   Ketoprofen, Aspirin, Gabapentin, Ibuprofen, Liraglutide, Naproxen, Omeprazole-sodium bicarbonate, Sulfa antibiotics, and Tramadol   Review of Systems Review of Systems See HPI  Physical Exam Triage Vital Signs ED Triage Vitals  Enc Vitals Group     BP 08/16/20 1637 (!) 158/89     Pulse Rate 08/16/20 1637 (!) 104     Resp -- 22 - ysn     Temp 08/16/20 1637 99.2 F (37.3 C)     Temp Source  08/16/20 1637 Oral     SpO2 08/16/20 1637 97 %     Weight --      Height --      Head Circumference --      Peak Flow --      Pain Score 08/16/20 1631 8     Pain Loc --      Pain Edu? --      Excl. in [place]? --    No data found.  Updated Vital Signs BP (!) 158/89 (BP Location: Right Arm)   Pulse (!) 104   Temp 99.2 F (37.3 C) (Oral)   LMP 07/28/2020 (Approximate)   SpO2 97%      Physical Exam Constitutional:      General: She is not in acute distress.    Appearance: She is well-developed. She is obese.  HENT:     Head: Normocephalic and atraumatic.     Nose:     Comments: Mask is in place Eyes:     Conjunctiva/sclera: Conjunctivae normal.     Pupils: Pupils are equal, round, and reactive to light.  Cardiovascular:     Rate and Rhythm: Normal rate.  Pulmonary:     Effort: Pulmonary effort is normal. No respiratory distress.  Abdominal:     Palpations: Abdomen is  soft.  Musculoskeletal:        General: Normal range of motion.     Cervical back: Normal range of motion.  Skin:    General: Skin is warm and dry.     Comments: Faintly hyperpigmented malar rash with mild soft tissue swelling of skin.  Hyperpigmentation of lids.  Neurological:     Mental Status: She is alert.      UC Treatments / Results  Labs (all labs ordered are listed, but only abnormal results are displayed) Labs Reviewed - No data to display  EKG   Radiology No results found.  Procedures Procedures (including critical care time)  Medications Ordered in UC Medications - No data to display  Initial Impression / Assessment and Plan / UC Course  I have reviewed the triage vital signs and the nursing notes.  Pertinent labs & imaging results that were available during my care of the patient were reviewed by me and considered in my medical decision making (see chart for details).      We will continue patient on ultra low potency steroid to use on this rash.  Continue the Zyrtec.   Dermatology consult is recommended. Final Clinical Impressions(s) / UC Diagnoses   Final diagnoses:  Rash and nonspecific skin eruption     Discharge Instructions     Take Zyrtec every day.  This is your allergy pill Use the triamcinolone cream sparingly.  A small amount across her cheeks once or twice a day. Do not use for more than 4 weeks Refills need to come from your primary care doctor See dermatology in follow-up   ED Prescriptions    Medication Sig Dispense Auth. Provider   triamcinolone (KENALOG) 0.025 % cream Apply 1 application topically 2 (two) times daily. 30 g Raylene Everts, MD   cetirizine (ZYRTEC) 10 MG tablet Take 1 tablet (10 mg total) by mouth daily. 30 tablet Raylene Everts, MD     PDMP not reviewed this encounter.   Raylene Everts, MD 08/16/20 [phone]

## 2020-08-17 ENCOUNTER — Encounter: Payer: Self-pay | Admitting: Physical Medicine and Rehabilitation

## 2020-08-17 ENCOUNTER — Other Ambulatory Visit: Payer: Self-pay | Admitting: Nurse Practitioner

## 2020-08-17 ENCOUNTER — Telehealth: Payer: Self-pay | Admitting: Pulmonary Disease

## 2020-08-17 DIAGNOSIS — R21 Rash and other nonspecific skin eruption: Secondary | ICD-10-CM

## 2020-08-17 NOTE — Telephone Encounter (Signed)
LMTCB x1 for pt.  

## 2020-08-17 NOTE — Telephone Encounter (Signed)
lmtcb for pt.   Will sign off on message as this is the second unsuccessful attempt to reach pt. Will close encounter per triage protocol.

## 2020-08-17 NOTE — Telephone Encounter (Signed)
Spoke with pt. States that she went to urgent care for a non pulmonary problem yesterday. While she was there she was monitored while walking to the bathroom. Urgent care told her that with exertion her heart rate was reaching in to the 120's - 130's. Her oxygen level remained above 90% on room air while this test was done. Urgent care told her that she would benefit from oxygen to keep her heart rate down with exertion. Advised her that insurance will not cover oxygen if her oxygen level was normal, regardless of what her pulse rate is. Offered pt an appointment for [place] to properly test her to see if supplemental oxygen is needed. Pt has been scheduled to see Arlys John on 08/19/20 at 1130. Nothing further was needed.

## 2020-08-18 ENCOUNTER — Ambulatory Visit: Payer: Medicaid Other | Admitting: Family

## 2020-08-18 ENCOUNTER — Ambulatory Visit: Payer: Medicaid Other | Admitting: Family Medicine

## 2020-08-18 ENCOUNTER — Telehealth: Payer: Self-pay | Admitting: Cardiovascular Disease

## 2020-08-18 NOTE — Telephone Encounter (Signed)
New Message:      Pt says she needs a note for work . It needs to state that she needs to be out of work, because of her condition.

## 2020-08-18 NOTE — Telephone Encounter (Signed)
She has nl LV systolic fxn with G1DD. Still smoking. No indication for medical disability for CHF

## 2020-08-18 NOTE — Telephone Encounter (Signed)
Patient is calling is stating that she needs a note for her disability claim from Dr. Allyson Sabal saying that she can no longer work due to her heart failure. I let the patient know that I would send Dr. Allyson Sabal a message with this request.

## 2020-08-19 ENCOUNTER — Ambulatory Visit: Payer: Medicaid Other | Admitting: Pulmonary Disease

## 2020-08-19 ENCOUNTER — Other Ambulatory Visit: Payer: Self-pay

## 2020-08-19 ENCOUNTER — Encounter: Payer: Self-pay | Admitting: Pulmonary Disease

## 2020-08-19 VITALS — BP 134/84 | HR 98 | Temp 98.6°F | Ht 64.0 in | Wt 274.4 lb

## 2020-08-19 DIAGNOSIS — F17200 Nicotine dependence, unspecified, uncomplicated: Secondary | ICD-10-CM

## 2020-08-19 DIAGNOSIS — R0602 Shortness of breath: Secondary | ICD-10-CM | POA: Diagnosis not present

## 2020-08-19 DIAGNOSIS — J449 Chronic obstructive pulmonary disease, unspecified: Secondary | ICD-10-CM | POA: Diagnosis not present

## 2020-08-19 DIAGNOSIS — I5032 Chronic diastolic (congestive) heart failure: Secondary | ICD-10-CM

## 2020-08-19 DIAGNOSIS — R918 Other nonspecific abnormal finding of lung field: Secondary | ICD-10-CM | POA: Insufficient documentation

## 2020-08-19 DIAGNOSIS — R5381 Other malaise: Secondary | ICD-10-CM | POA: Insufficient documentation

## 2020-08-19 DIAGNOSIS — Z0000 Encounter for general adult medical examination without abnormal findings: Secondary | ICD-10-CM

## 2020-08-19 NOTE — Assessment & Plan Note (Signed)
Plan: Walk today in office Symbicort 160 Spiriva Respimat 2.5 Emphasized need for the patient to stop smoking

## 2020-08-19 NOTE — Patient Instructions (Addendum)
You were seen today by Coral Ceo, NP  for:   1. Shortness of breath  We walked you today in office and you did not qualify for oxygen  I would recommend following up with cardiology due to your concern of your elevated heart rate  It appears that they did address this in October/2021 when they saw you last recommending that you continue your medications as well as continue to work on weight loss  2. Chronic obstructive pulmonary disease, unspecified COPD type (HCC)  Continue Symbicort 160 >>> 2 puffs in the morning right when you wake up, rinse out your mouth after use, 12 hours later 2 puffs, rinse after use >>> Take this daily, no matter what >>> This is not a rescue inhaler   Spiriva Respimat 2.5 >>> 2 puffs daily >>> Do this every day >>>This is not a rescue inhaler  Note your daily symptoms > remember "red flags" for COPD:   >>>Increase in cough >>>increase in sputum production >>>increase in shortness of breath or activity  intolerance.   If you notice these symptoms, please call the office to be seen.    3. Tobacco dependence  As explained today we need you to stop smoking.  It is very difficult for [place] to manage her breathing appropriately if we continue to smoke.  We recommend that you stop smoking.  >>>You need to set a quit date >>>If you have friends or family who smoke, let them know you are trying to quit and not to smoke around you or in your living environment  Smoking Cessation Resources:  1 800 QUIT NOW  >>> Patient to call this resource and utilize it to help support her quit smoking >>> Keep up your hard work with stopping smoking  You can also contact the [facility] >>>For smoking cessation classes call [phone]  We do not recommend using e-cigarettes as a form of stopping smoking  You can sign up for smoking cessation support texts and information:  >>>https://smokefree.gov/smokefreetxt    4. Morbid obesity (HCC)  Your  BMI today is 67  You have had a weight increase since last being seen  Would recommend continuing to work with primary care on strategies to reduce her BMI  Continue to work on increasing your physical activity  5. Chronic diastolic CHF (congestive heart failure) (HCC)  Continue your fluid pills  Continue to weigh yourself daily  Keep follow-up with cardiology  6. Abnormal findings on diagnostic imaging of lung  - CT Chest Wo Contrast; Future  The CT of your chest that we did in October/2021 showed small pulmonary nodules  We will repeat a CT of your chest in 1 year  7. Physical deconditioning  Believe that unfortunately due to your medical multiple comorbidities and sedentary lifestyle you have physical deconditioning.  As discussed in our office today would recommend that you increase your physical activity.  Goal should be to reach 30 minutes of aerobic activity a day.  I would recommend starting slowly around 5 minutes of walking every day.  Every week he can add on 1 minute.  8. Healthcare maintenance  I am glad that you received your flu vaccine  Keep follow-ups with primary care  As discussed today at length would recommend that you obtain COVID-19 vaccines as you are at high risk of complications from COVID-19 given your comorbidities.   We recommend today:  Orders Placed This Encounter  Procedures  . CT Chest Wo Contrast  Standing Status:   Future    Standing Expiration Date:   08/19/2021    Scheduling Instructions:     Oct/2022    Order Specific Question:   Is patient pregnant?    Answer:   No    Order Specific Question:   Preferred imaging location?    Answer:   [facility]   Orders Placed This Encounter  Procedures  . CT Chest Wo Contrast   No orders of the defined types were placed in this encounter.   Follow Up:    Return in about 3 months (around 11/19/2020), or if symptoms worsen or fail to improve, for Follow up with Dr.  Tonia Brooms.   Notification of test results are managed in the following manner: If there are  any recommendations or changes to the  plan of care discussed in office today,  we will contact you and let you know what they are. If you do not hear from [place], then your results are normal and you can view them through your  MyChart account , or a letter will be sent to you. Thank you again for trusting [place] with your care  - Thank you, [facility]    It is flu season:   >>> Best ways to protect herself from the flu: Receive the yearly flu vaccine, practice good hand hygiene washing with soap and also using hand sanitizer when available, eat a nutritious meals, get adequate rest, hydrate appropriately       Please contact the office if your symptoms worsen or you have concerns that you are not improving.   Thank you for choosing [facility] for your healthcare, and for allowing [place] to partner with you on your healthcare journey. I am thankful to be able to provide care to you today.   Elisha Headland FNP-C

## 2020-08-19 NOTE — Assessment & Plan Note (Signed)
Sedentary lifestyle Not physically active  Patient was not interested in discussing activity regimens to improve her physical deconditioning. She reports that she "does enough"

## 2020-08-19 NOTE — Assessment & Plan Note (Signed)
Recent CT imaging showed small pulmonary nodule Patient is an active smoker  We will repeat a CT of her chest in 1 year

## 2020-08-19 NOTE — Progress Notes (Signed)
_0  ID: Jill Shaw, female    DOB: [birth year], 44 y.o.   MRN: [id number]  Chief Complaint  Patient presents with  . Follow-up    COPD, Qualifying for O2    Referring provider: Vevelyn Francois, NP  HPI:  44 year old female current everyday smoker followed in our office for COPD  PMH: Hypertension, type 2 diabetes, morbid obesity, chronic kidney disease Smoker/ Smoking History: Current smoker.  4 to 5 cigarettes a day.  13-pack-year smoking history Maintenance: Symbicort 160, Spiriva Respimat 2.5 Pt of: Dr. Valeta Harms  08/19/2020  - Visit   44 year old female current everyday smoker followed in our office for COPD.  Patient presenting today as a follow-up.  Patient believes that she should qualify for oxygen based off of her symptoms.  She was seen by an urgent care provider on 08/16/2020 and treated for a rash.  Per patient she was walked at that office and her she became tachycardic although she was not hypoxemic in the suggested that she may benefit from being on oxygen.  She contacted our office.  We have discussed this in the past with the patient.  Although she is quite dyspneic with exertion she is never hypoxemic.  Plan of care at last office visit in September/2021 was as follows: Encourage patient to stop smoking, obtain lab work.  Schedule follow-up with primary care given uncontrolled diabetes.  A D-dimer was performed.  Lab work from that office visit was stable with the exception of the D-dimer which was elevated at 1.24.  Patient completed a CTA of her chest on 07/20/2020 which was negative for PE.  She did have pulmonary nodules measuring up to 4 mm in the recommending follow-up CT chest in 12 months.  Patient continues to smoke. She reports that she "is not smoking that much". Patient continues smoke 4 to 5 cigarettes a day. She is not ready to quit smoking. She is unvaccinated COVID-19. She reports that she "does not trust it" for her reasoning of not being vaccinated.  She is aware that due to her multiple comorbidities she is at high risk of complications from [id number].  She admits she has sedentary lifestyle. Although she reports that she "does not" when asked about exercise.  Patient is concerned today about her tachycardia. She is also followed by cardiology. She last saw cardiology PA in October/2021. She is maintained on metoprolol. She remains adherent to this. They have also encouraged her to increase her physical activity as well as work to reduce her BMI.  Patient walked in office today. Did not have oxygen desaturations. Was only able to complete 1 lap due to fatigue.  Questionaires / Pulmonary Flowsheets:   ACT:  No flowsheet data found.  MMRC: mMRC Dyspnea Scale mMRC Score  07/12/2020 3    Epworth:  No flowsheet data found.  Tests:    07/02/2020-chest x-ray-mild cardiomegaly and central vascular congestion similar to prior, some increasing hazy and interstitial opacities towards the lung bases which could reflect edema or atelectasis  03/08/2020-CT angio chest-no pulmonary nodules or masses, no effusion or pneumothorax  04/30/2020-echocardiogram-LV ejection fraction 55 to 60%, moderate concentric left ventricular hypertrophy, and grade 1 diastolic dysfunction  41/28/7867-EHMCNOBSJ function test-FVC 2.31 (77 10/07/2019-pulmonary function test-FVC 2.31 (72% addicted), postbronchodilator ratio 66, postbronchodilator FEV1 1.58 (61% predicted), no bronchodilator response, TLC 4.35 (83% predicted), DLCO 19.24 (85% predicted)    FENO:  No results found for: NITRICOXIDE  PFT: PFT Results Latest Ref Rng & Units 10/07/2019  FVC-Pre L  2.31  FVC-Predicted Pre % 72  FVC-Post L 2.41  FVC-Predicted Post % 76  Pre FEV1/FVC % % 77  Post FEV1/FCV % % 66  FEV1-Pre L 1.78  FEV1-Predicted Pre % 68  FEV1-Post L 1.58  DLCO uncorrected ml/min/mmHg 19.24  DLCO UNC% % 85  DLVA Predicted % 121  TLC L 4.35  TLC % Predicted % 83  RV % Predicted %  109    WALK:  SIX MIN WALK 07/12/2020  Supplimental Oxygen during Test? (L/min) No  Tech Comments: patient walked at average speed, stopped after 2 laps d/t sob.  Sats did not drop below 96% on RA.    Imaging: No results found.  Lab Results:  CBC    Component Value Date/Time   WBC 13.0 (H) 08/11/2020 1454   WBC 13.4 (H) 07/12/2020 1649   RBC 4.96 08/11/2020 1454   RBC 4.46 07/12/2020 1649   HGB 9.5 (L) 08/11/2020 1454   HCT 34.7 08/11/2020 1454   PLT 311 08/11/2020 1454   MCV 70 (L) 08/11/2020 1454   MCH 19.2 (L) 08/11/2020 1454   MCH 20.6 (L) 07/04/2020 1209   MCHC 27.4 (L) 08/11/2020 1454   MCHC 30.2 07/12/2020 1649   RDW 23.1 (H) 08/11/2020 1454   LYMPHSABS 2.5 07/12/2020 1649   LYMPHSABS 2.7 06/02/2020 1352   MONOABS 0.8 07/12/2020 1649   EOSABS 0.4 07/12/2020 1649   EOSABS 0.5 (H) 06/02/2020 1352   BASOSABS 0.1 07/12/2020 1649   BASOSABS 0.1 06/02/2020 1352    BMET    Component Value Date/Time   NA 140 07/26/2020 1202   K 4.0 07/26/2020 1202   CL 102 07/26/2020 1202   CO2 22 07/26/2020 1202   GLUCOSE 160 (H) 07/26/2020 1202   GLUCOSE 275 (H) 07/12/2020 1649   BUN 10 07/26/2020 1202   CREATININE 1.13 (H) 07/26/2020 1202   CREATININE 1.18 (H) 08/31/2017 1621   CALCIUM 8.7 07/26/2020 1202   GFRNONAA 60 07/26/2020 1202   GFRNONAA 58 (L) 08/31/2017 1621   GFRAA 69 07/26/2020 1202   GFRAA 67 08/31/2017 1621    BNP    Component Value Date/Time   BNP 266.3 (H) 06/22/2020 0636   BNP 67.5 03/30/2017 1438    ProBNP    Component Value Date/Time   PROBNP 57.0 07/12/2020 1649    Specialty Problems      Pulmonary Problems   Chronic obstructive pulmonary disease (HCC)    COPD      Shortness of breath      Allergies  Allergen Reactions  . Ketoprofen Nausea And Vomiting  . Aspirin Nausea Only  . Gabapentin Nausea And Vomiting and Other (See Comments)    upset stomach  . Ibuprofen Nausea And Vomiting  . Liraglutide Nausea And Vomiting  .  Naproxen Nausea And Vomiting  . Omeprazole-Sodium Bicarbonate Nausea And Vomiting  . Sulfa Antibiotics Nausea And Vomiting  . Tramadol Nausea And Vomiting and Other (See Comments)    stomach upset    Immunization History  Administered Date(s) Administered  . Influenza,inj,Quad PF,6+ Mos 09/03/2018, 07/03/2019, 07/04/2020  . Pneumococcal Polysaccharide-23 03/28/2015, 07/04/2020  . Tdap 03/27/2016    Past Medical History:  Diagnosis Date  . Anemia   . Arthritis    knees, hands  . Asthma   . Chronic diastolic (congestive) heart failure ([place])   . COPD (chronic obstructive pulmonary disease) ([place])   . Diabetes mellitus without complication ([place])    type 2  . Dysfunctional uterine bleeding   .  GERD (gastroesophageal reflux disease)   . Hypertension   . Neuromuscular disorder (HCC)    neuropathy feet  . Seizures ([place]) 09/12/2017   pt states r/t stress and blood sugar - no meds last one 4 months ago, not seen neurologist  . Sickle cell trait ([place])   . Smoker   . Vitamin D deficiency 10/2019  . Wears glasses     Tobacco History: Social History   Tobacco Use  Smoking Status Current Every Day Smoker  . Packs/day: 0.25  . Years: 26.00  . Pack years: 6.50  . Types: Cigarettes  Smokeless Tobacco Never Used  Tobacco Comment   4-5 cigarettes/day   Ready to quit: No Counseling given: Yes Comment: 4-5 cigarettes/day   Continue to not smoke  Outpatient Encounter Medications as of 08/19/2020  Medication Sig  . albuterol (PROVENTIL) (2.5 MG/3ML) 0.083% nebulizer solution Take 3 mLs (2.5 mg total) by nebulization every 6 (six) hours as needed for wheezing or shortness of breath.  Marland Kitchen albuterol (VENTOLIN HFA) 108 (90 Base) MCG/ACT inhaler Inhale 2 puffs into the lungs every 6 (six) hours as needed for wheezing or shortness of breath.  . baclofen (LIORESAL) 10 MG tablet Take 5-10 mg by mouth 3 (three) times daily as needed.  . blood glucose meter kit and supplies KIT Dispense based  on patient and insurance preference. Use up to four times daily as directed. (FOR ICD-9 250.00, 250.01). (Patient taking differently: 1 each by Other route See admin instructions. Dispense based on patient and insurance preference. Use up to four times daily as directed. (FOR ICD-9 250.00, 250.01).)  . Blood Glucose Monitoring Suppl (TRUE METRIX METER) w/Device KIT 1 each by Does not apply route 4 (four) times daily -  before meals and at bedtime.  . budesonide-formoterol (SYMBICORT) 160-4.5 MCG/ACT inhaler INHALE 2 PUFFS INTO THE LUNGS 2 (TWO) TIMES DAILY. (Patient taking differently: Inhale 2 puffs into the lungs 2 (two) times daily. )  . buPROPion (WELLBUTRIN SR) 150 MG 12 hr tablet Take 1 tablet (150 mg total) by mouth 2 (two) times daily.  . cetirizine (ZYRTEC) 10 MG tablet Take 1 tablet (10 mg total) by mouth daily.  . cetirizine (ZYRTEC) 10 MG tablet Take 1 tablet (10 mg total) by mouth daily.  . Cholecalciferol (VITAMIN D-3) 125 MCG (5000 UT) TABS Take 1 tablet by mouth daily. (Patient taking differently: Take 5,000 Units by mouth daily. )  . Continuous Blood Gluc Receiver (FREESTYLE LIBRE 14 DAY READER) DEVI 1 application by Does not apply route every 14 (fourteen) days.  . Continuous Blood Gluc Sensor (FREESTYLE LIBRE 14 DAY SENSOR) MISC Inject 1 applicator into the skin every 14 (fourteen) days for 14 days.  . diclofenac (VOLTAREN) 75 MG EC tablet Take 75 mg by mouth 2 (two) times daily as needed.  . diclofenac Sodium (VOLTAREN) 1 % GEL SMARTSIG:4 Gram(s) Topical 4 Times Daily PRN  . ferrous sulfate 325 (65 FE) MG tablet Take 1 tablet (325 mg total) by mouth 3 (three) times daily with meals. (Patient taking differently: Take 325 mg by mouth 2 (two) times daily with a meal. )  . fluticasone (FLONASE) 50 MCG/ACT nasal spray Place 2 sprays into both nostrils daily.  . furosemide (LASIX) 40 MG tablet Take 1 tablet (40 mg total) by mouth 2 (two) times daily.  . Glucosamine Sulfate 1000 MG CAPS  Take 1 capsule (1,000 mg total) by mouth 2 (two) times daily.  Marland Kitchen glucose blood (TRUE METRIX BLOOD GLUCOSE TEST) test  strip Use as instructed  . HYDROmorphone (DILAUDID) 2 MG tablet Take 1-2 tablets (2-4 mg total) by mouth every 4 (four) hours as needed for severe pain.  . hydroquinone 4 % cream Apply topically 2 (two) times daily.  . Insulin Lispro Prot & Lispro (HUMALOG MIX 75/25 KWIKPEN) (75-25) 100 UNIT/ML Kwikpen INJECT 110 UNITS EVERY 12 HOURS  . Insulin Pen Needle (PEN NEEDLES) 30G X 5 MM MISC 1 Units by Does not apply route as directed.  Marland Kitchen losartan (COZAAR) 50 MG tablet Take 1 tablet (50 mg total) by mouth daily.  . metoCLOPramide (REGLAN) 10 MG tablet Take 1 tablet (10 mg total) by mouth 4 (four) times daily -  before meals and at bedtime.  . metoprolol succinate (TOPROL-XL) 100 MG 24 hr tablet Take 1 tablet (100 mg total) by mouth daily. Take with or immediately following a meal.  . nicotine (NICODERM CQ) 21 mg/24hr patch Place 1 patch (21 mg total) onto the skin daily.  . nicotine polacrilex (COMMIT) 4 MG lozenge Take 1 lozenge (4 mg total) by mouth as needed for smoking cessation.  . norethindrone (AYGESTIN) 5 MG tablet Take 2 tablets (10 mg total) by mouth in the morning, at noon, in the evening, and at bedtime. With bleeding  . omeprazole (PRILOSEC) 40 MG capsule TAKE 1 CAPSULE (40 MG TOTAL) BY MOUTH DAILY.  Marland Kitchen potassium chloride SA (KLOR-CON) 20 MEQ tablet Take 1 tablet (20 mEq total) by mouth daily.  . promethazine (PHENERGAN) 12.5 MG tablet Take 1 tablet (12.5 mg total) by mouth every 6 (six) hours as needed for nausea or vomiting.  . rosuvastatin (CRESTOR) 5 MG tablet Take 1 tablet (5 mg total) by mouth daily.  . saxagliptin HCl (ONGLYZA) 2.5 MG TABS tablet Take 1 tablet (2.5 mg total) by mouth daily.  . sucralfate (CARAFATE) 1 GM/10ML suspension Take 10 mLs (1 g total) by mouth 4 (four) times daily -  with meals and at bedtime.  . Tiotropium Bromide Monohydrate (SPIRIVA RESPIMAT)  2.5 MCG/ACT AERS Inhale 2 puffs into the lungs daily.  . tizanidine (ZANAFLEX) 6 MG capsule Take 1 capsule (6 mg total) by mouth 3 (three) times daily.  Marland Kitchen triamcinolone (KENALOG) 0.025 % cream Apply 1 application topically 2 (two) times daily.  . TRUEplus Lancets 28G MISC 1 each by Other route in the morning, at noon, in the evening, and at bedtime.  . Turmeric 500 MG CAPS Take 500 mg by mouth 2 (two) times daily.  . Vitamin D, Ergocalciferol, (DRISDOL) 1.25 MG (50000 UNIT) CAPS capsule Take 1 capsule (50,000 Units total) by mouth every 7 (seven) days. (Patient taking differently: Take 50,000 Units by mouth every Friday. )  . [DISCONTINUED] hydrALAZINE (APRESOLINE) 50 MG tablet Take 1 tablet (50 mg total) by mouth 3 (three) times daily.   No facility-administered encounter medications on file as of 08/19/2020.     Review of Systems  Review of Systems  Constitutional: Positive for fatigue. Negative for activity change and fever.  HENT: Negative for sinus pressure, sinus pain and sore throat.   Respiratory: Positive for shortness of breath. Negative for cough and wheezing.   Cardiovascular: Positive for palpitations. Negative for chest pain.  Gastrointestinal: Negative for diarrhea, nausea and vomiting.  Musculoskeletal: Negative for arthralgias.  Neurological: Negative for dizziness.  Psychiatric/Behavioral: Negative for sleep disturbance. The patient is not nervous/anxious.      Physical Exam  BP 134/84 (BP Location: Left Arm, Cuff Size: Normal)   Pulse 98   Temp  98.6 F (37 C) (Oral)   Ht _0  (1.626 m)   Wt 274 lb 6.4 oz (124.5 kg)   LMP 07/28/2020 (Approximate)   SpO2 99%   BMI 47.10 kg/m   Wt Readings from Last 5 Encounters:  08/19/20 274 lb 6.4 oz (124.5 kg)  08/11/20 269 lb 12.8 oz (122.4 kg)  07/26/20 276 lb 6.4 oz (125.4 kg)  07/19/20 271 lb (122.9 kg)  07/16/20 266 lb (120.7 kg)    BMI Readings from Last 5 Encounters:  08/19/20 47.10 kg/m  08/11/20 46.31  kg/m  07/26/20 47.44 kg/m  07/19/20 46.52 kg/m  07/16/20 45.66 kg/m     Physical Exam Vitals and nursing note reviewed.  Constitutional:      General: She is not in acute distress.    Appearance: Normal appearance. She is obese.  HENT:     Head: Normocephalic and atraumatic.     Right Ear: Tympanic membrane, ear canal and external ear normal. There is no impacted cerumen.     Left Ear: Tympanic membrane, ear canal and external ear normal. There is no impacted cerumen.     Nose: Nose normal. No congestion.     Mouth/Throat:     Mouth: Mucous membranes are moist.     Pharynx: Oropharynx is clear.  Eyes:     Pupils: Pupils are equal, round, and reactive to light.  Cardiovascular:     Rate and Rhythm: Normal rate and regular rhythm.     Pulses: Normal pulses.     Heart sounds: Normal heart sounds. No murmur heard.   Pulmonary:     Effort: Pulmonary effort is normal. No respiratory distress.     Breath sounds: Normal breath sounds. No decreased air movement. No decreased breath sounds, wheezing or rales.  Abdominal:     Comments: Obese  Musculoskeletal:     Cervical back: Normal range of motion.  Skin:    General: Skin is warm and dry.     Capillary Refill: Capillary refill takes less than 2 seconds.  Neurological:     General: No focal deficit present.     Mental Status: She is alert and oriented to person, place, and time. Mental status is at baseline.     Gait: Gait normal.  Psychiatric:        Mood and Affect: Mood normal.        Behavior: Behavior normal.        Thought Content: Thought content normal.        Judgment: Judgment normal.       Assessment & Plan:   Chronic diastolic CHF (congestive heart failure) (HCC) Plan: Continue Lasix Keep follow-up with cardiology and primary care Continue to weigh yourself regularly  Chronic obstructive pulmonary disease ([place]) Plan: Walk today in office Symbicort 160 Spiriva Respimat 2.5 Emphasized need for the  patient to stop smoking  Tobacco dependence Patient is not ready to stop smoking We discussed this at length.  Patient is aware that she is increasing her risk of lung disease as well as affecting her current concern of dyspnea by continuing to smoke.  Plan: Emphasized need to stop smoking  Shortness of breath Likely patient shortness of breath is multifactorial given morbid obesity, current smoker status, known COPD, CHF.    Plan: Keep follow-ups with primary care and cardiology Emphasized to patient she needs to stop smoking Reviewed with patient that I believe that her physical deconditioning and sedentary lifestyle are worsening her shortness of breath, she is  not interested in increasing her own physical activity Continue inhalers Reminded patient that she needs to stop smoking. Keep follow-up with cardiology and continue diuretics No oxygen desaturations with walk today in office  Physical deconditioning Sedentary lifestyle Not physically active  Patient was not interested in discussing activity regimens to improve her physical deconditioning. She reports that she "does enough"  Morbid obesity ([place]) Patient's BMI is 47 Patient has had recent weight increases This is a contributing to patient's shortness of breath  Plan: Continue to work with primary care on strategies to reduce your BMI  Healthcare maintenance Up-to-date with flu vaccine Counseled patient that we would recommend that she obtain the COVID-19 vaccinations, she declines Patient is well aware of her increased risk of complications with [id number] due to her comorbidities and that she is an increased mortality risk to COVID-19 by being unvaccinated  Abnormal findings on diagnostic imaging of lung Recent CT imaging showed small pulmonary nodule Patient is an active smoker  We will repeat a CT of her chest in 1 year    Return in about 3 months (around 11/19/2020), or if symptoms worsen or fail to improve,  for Follow up with Dr. Valeta Harms.   Lauraine Rinne, NP 08/19/2020   This appointment required 42 minutes of patient care (this includes precharting, chart review, review of results, face-to-face care, etc.).

## 2020-08-19 NOTE — Assessment & Plan Note (Signed)
Up-to-date with flu vaccine Counseled patient that we would recommend that she obtain the COVID-19 vaccinations, she declines Patient is well aware of her increased risk of complications with COVID-19 due to her comorbidities and that she is an increased mortality risk to COVID-19 by being unvaccinated

## 2020-08-19 NOTE — Assessment & Plan Note (Signed)
Plan: Continue Lasix Keep follow-up with cardiology and primary care Continue to weigh yourself regularly

## 2020-08-19 NOTE — Assessment & Plan Note (Signed)
Patient is not ready to stop smoking We discussed this at length.  Patient is aware that she is increasing her risk of lung disease as well as affecting her current concern of dyspnea by continuing to smoke.  Plan: Emphasized need to stop smoking

## 2020-08-19 NOTE — Assessment & Plan Note (Signed)
Likely patient shortness of breath is multifactorial given morbid obesity, current smoker status, known COPD, CHF.    Plan: Keep follow-ups with primary care and cardiology Emphasized to patient she needs to stop smoking Reviewed with patient that I believe that her physical deconditioning and sedentary lifestyle are worsening her shortness of breath, she is not interested in increasing her own physical activity Continue inhalers Reminded patient that she needs to stop smoking. Keep follow-up with cardiology and continue diuretics No oxygen desaturations with walk today in office

## 2020-08-19 NOTE — Assessment & Plan Note (Signed)
Patient's BMI is 47 Patient has had recent weight increases This is a contributing to patient's shortness of breath  Plan: Continue to work with primary care on strategies to reduce your BMI

## 2020-08-19 NOTE — Telephone Encounter (Signed)
Left message for patient to call back  

## 2020-08-20 ENCOUNTER — Other Ambulatory Visit: Payer: Self-pay | Admitting: Nurse Practitioner

## 2020-08-20 ENCOUNTER — Ambulatory Visit: Payer: Medicaid Other | Admitting: Cardiovascular Disease

## 2020-08-20 DIAGNOSIS — Z794 Long term (current) use of insulin: Secondary | ICD-10-CM

## 2020-08-20 DIAGNOSIS — I5032 Chronic diastolic (congestive) heart failure: Secondary | ICD-10-CM

## 2020-08-20 DIAGNOSIS — E119 Type 2 diabetes mellitus without complications: Secondary | ICD-10-CM

## 2020-08-20 DIAGNOSIS — J449 Chronic obstructive pulmonary disease, unspecified: Secondary | ICD-10-CM

## 2020-08-20 MED ORDER — INSULIN LISPRO PROT & LISPRO (75-25 MIX) 100 UNIT/ML KWIKPEN: PEN_INJECTOR | SUBCUTANEOUS | 11 refills | Status: DC

## 2020-08-20 NOTE — Telephone Encounter (Signed)
Pt notified of Dr Hazle Coca message. She states that she will discuss this with Dr Allyson Sabal at her appointment today

## 2020-08-20 NOTE — Telephone Encounter (Signed)
Attempted to call patient to discuss breathing. Message left to call back.

## 2020-08-20 NOTE — Progress Notes (Signed)
Thanks for seeing her Jill Shaw [facility] Pulmonary Critical Care 08/20/2020 8:36 AM

## 2020-08-20 NOTE — Telephone Encounter (Signed)
Patient cancelled appointment via Mychart message

## 2020-08-21 DIAGNOSIS — I959 Hypotension, unspecified: Secondary | ICD-10-CM | POA: Diagnosis not present

## 2020-08-21 DIAGNOSIS — R0902 Hypoxemia: Secondary | ICD-10-CM | POA: Diagnosis not present

## 2020-08-21 DIAGNOSIS — E1165 Type 2 diabetes mellitus with hyperglycemia: Secondary | ICD-10-CM | POA: Diagnosis not present

## 2020-08-23 ENCOUNTER — Other Ambulatory Visit: Payer: Self-pay

## 2020-08-23 ENCOUNTER — Ambulatory Visit (INDEPENDENT_AMBULATORY_CARE_PROVIDER_SITE_OTHER): Payer: Medicaid Other

## 2020-08-23 ENCOUNTER — Ambulatory Visit (INDEPENDENT_AMBULATORY_CARE_PROVIDER_SITE_OTHER): Payer: Medicaid Other | Admitting: Podiatry

## 2020-08-23 DIAGNOSIS — M7661 Achilles tendinitis, right leg: Secondary | ICD-10-CM

## 2020-08-23 DIAGNOSIS — M722 Plantar fascial fibromatosis: Secondary | ICD-10-CM

## 2020-08-25 ENCOUNTER — Ambulatory Visit: Payer: Medicaid Other | Admitting: Family

## 2020-08-25 MED FILL — HYDROCORTISONE 2.5% CREAM: 2.5 | 30 days supply | Qty: 30 | Fill #2

## 2020-08-27 ENCOUNTER — Ambulatory Visit (INDEPENDENT_AMBULATORY_CARE_PROVIDER_SITE_OTHER): Payer: Medicaid Other

## 2020-08-27 ENCOUNTER — Ambulatory Visit (INDEPENDENT_AMBULATORY_CARE_PROVIDER_SITE_OTHER): Payer: Medicaid Other | Admitting: Physician Assistant

## 2020-08-27 ENCOUNTER — Encounter: Payer: Self-pay | Admitting: Physician Assistant

## 2020-08-27 ENCOUNTER — Ambulatory Visit: Payer: Self-pay

## 2020-08-27 DIAGNOSIS — M1712 Unilateral primary osteoarthritis, left knee: Secondary | ICD-10-CM | POA: Diagnosis not present

## 2020-08-27 DIAGNOSIS — M25512 Pain in left shoulder: Secondary | ICD-10-CM

## 2020-08-27 MED ORDER — MELOXICAM 7.5 MG PO TABS: 7.5000 mg | ORAL_TABLET | Freq: Every day | ORAL | 1 refills | Status: DC

## 2020-08-27 MED ORDER — DICLOFENAC SODIUM 1 % EX GEL: 2.0000 g | Freq: Four times a day (QID) | CUTANEOUS | 5 refills | Status: DC | PRN

## 2020-08-27 NOTE — Progress Notes (Signed)
Office Visit Note   Patient: Jill Shaw           Date of Birth: 1975/11/04           MRN: [id number] Visit Date: 08/27/2020              Requested by: Barbette Merino, NP [address] [place],  [place] [address] PCP: Barbette Merino, NP  Chief Complaint  Patient presents with  . Right Knee - Pain  . Left Knee - Pain      HPI: The patient is a pleasant 44 year old woman with a several week history of bilateral knee pain and left shoulder pain.  She is followed by Dr. Prince Rome.  She said this occurred after she fell.  She denies any loss of consciousness or other injuries.  She has decreased motion in her left shoulder.  She also complains of generalized bilateral knee pain.  She has not had any injections in these knees in the past that she does not want to do this.  She does use topical Voltaren gel and would like some more this.  The reason she is here today is she would like for me to order MRIs of her knees and left shoulder.  She was told by Dr. Prince Rome that she had to be seen and evaluated prior to this  Assessment & Plan: Visit Diagnoses:  1. Unilateral primary osteoarthritis, left knee   2. Acute pain of left shoulder     Plan: Bilateral knees.  History of degenerative disease with new onset of symptoms of joint line tenderness.  No instability.  X-rays do not show any acute changes.  Per her request I will go forward and order bilateral knee MRIs.  She will follow-up with Dr. Prince Rome.  I did tell her to continue to use the Voltaren and I placed her on some Mobic.  I do think she might do well with an injection which she has declined today Left shoulder pain findings consistent with rotator cuff tendinitis.  Again I discussed with her a cortisone injection which she declined today.  I asked that she follow-up with Dr. Prince Rome on this as well  Follow-Up Instructions: No follow-ups on file.   Ortho Exam  Patient is alert, oriented, no adenopathy, well-dressed, normal affect,  normal respiratory effort. Bilateral knees no effusion no abrasions no cellulitis she has full range of motion global tenderness around the joint lines.  No patellar grinding  Left shoulder she does have some impingement findings she is able to forward elevate to about 170 degrees at which time it becomes somewhat painful.  She has internal rotation behind her back to the belt line.  Bilateral grip strength is equal.  Imaging: XR Knee 1-2 Views Right  Result Date: 08/27/2020 2 views of the knee were reviewed today.  Well-maintained alignment mild joint space narrowing  XR Shoulder Left  Result Date: 08/27/2020 X-rays of her left shoulder do not demonstrate any acute bony abnormalities humeral head is reduced in the glenoid.  No images are attached to the encounter.  Labs: Lab Results  Component Value Date   HGBA1C 9.2 (A) 07/19/2020   HGBA1C 9.0 (A) 06/02/2020   HGBA1C 9.0 06/02/2020   HGBA1C 9.0 (A) 06/02/2020   HGBA1C 9.0 (A) 06/02/2020   ESRSEDRATE 98 (H) 11/24/2019   ESRSEDRATE 11 03/27/2016   CRP 19 (H) 11/24/2019   REPTSTATUS 03/13/2020 FINAL 03/08/2020   CULT  03/08/2020    NO GROWTH  5 DAYS Performed at [facility], [address] [place], [place] [address]      Lab Results  Component Value Date   ALBUMIN 4.0 07/12/2020   ALBUMIN 3.3 (L) 07/04/2020   ALBUMIN 2.9 (L) 07/03/2020    Lab Results  Component Value Date   MG 1.7 06/24/2020   MG 1.9 02/03/2020   MG 1.8 02/02/2020   Lab Results  Component Value Date   VD25OH 7.6 (L) 11/05/2019    No results found for: PREALBUMIN CBC EXTENDED Latest Ref Rng & Units 08/11/2020 07/12/2020 07/04/2020  WBC 3.4 - 10.8 x10E3/uL 13.0(H) 13.4(H) 13.1(H)  RBC 3.77 - 5.28 x10E6/uL 4.96 4.46 4.47  HGB 11.1 - 15.9 g/dL 5.8(K) 9.9(I) 3.3(A)  HCT 34.0 - 46.6 % 34.7 30.0(L) 31.9(L)  PLT 150 - 450 x10E3/uL 311 366.0 256  NEUTROABS 1.4 - 7.7 K/uL - 9.5(H) -  LYMPHSABS 0.7 - 4.0 K/uL - 2.5 -     There is no height  or weight on file to calculate BMI.  Orders:  Orders Placed This Encounter  Procedures  . XR Knee 1-2 Views Left  . XR Knee 1-2 Views Right  . XR Shoulder Left  . MR Shoulder Left w/o contrast  . MR Knee Left w/o contrast  . MR Knee Right w/o contrast   Meds ordered this encounter  Medications  . diclofenac Sodium (VOLTAREN) 1 % GEL    Sig: Apply 2 g topically 4 (four) times daily as needed.    Dispense:  80 g    Refill:  5  . meloxicam (MOBIC) 7.5 MG tablet    Sig: Take 1 tablet (7.5 mg total) by mouth daily.    Dispense:  30 tablet    Refill:  1     Procedures: No procedures performed  Clinical Data: No additional findings.  ROS:  All other systems negative, except as noted in the HPI. Review of Systems  Objective: Vital Signs: LMP 07/28/2020 (Approximate)   Specialty Comments:  No specialty comments available.  PMFS History: Patient Active Problem List   Diagnosis Date Noted  . Abnormal findings on diagnostic imaging of lung 08/19/2020  . Physical deconditioning 08/19/2020  . History of endometrial ablation 07/27/2020  . History of alcohol use 07/20/2020  . Current moderate episode of major depressive disorder without prior episode (HCC) 07/20/2020  . Sickle cell trait (HCC) 07/19/2020  . Elevated d-dimer 07/13/2020  . Healthcare maintenance 07/12/2020  . Shortness of breath 07/12/2020  . Atypical chest pain 07/12/2020  . At risk for obstructive sleep apnea 07/12/2020  . DKA (diabetic ketoacidoses) 07/02/2020  . Acute on chronic diastolic (congestive) heart failure (HCC) 06/22/2020  . Abdominal pain 03/08/2020  . Chronic diastolic CHF (congestive heart failure) (HCC) 03/08/2020  . AKI (acute kidney injury) (HCC) 01/29/2020  . Symptomatic anemia 12/19/2019  . Elevated sed rate 11/26/2019  . Elevated C-reactive protein (CRP) 11/26/2019  . Hypoglycemia 02/07/2019  . Hypotension 02/07/2019  . Lactic acidosis 02/07/2019  . Right ankle pain 02/07/2019    . Chronic obstructive pulmonary disease (HCC) 02/05/2019  . Seizure (HCC) 07/31/2018  . Vitamin D deficiency 05/31/2017  . Gastroesophageal reflux disease 05/24/2017  . Abnormal uterine bleeding (AUB) 05/17/2017  . Anemia of chronic disease 04/06/2017  . Knee pain, chronic 03/27/2016  . Type 2 diabetes mellitus without complication, with long-term current use of insulin (HCC) 03/27/2016  . Essential hypertension 03/27/2016  . Morbid obesity (HCC) 03/27/2016  . Irritable bowel syndrome with constipation  03/27/2016  . Tobacco dependence 03/27/2016  . CKD (chronic kidney disease) stage 2, GFR 60-89 ml/min 03/25/2016  . Arthropathy, lower leg 05/04/2013  . CTS (carpal tunnel syndrome) 05/04/2013  . Peripheral edema 05/04/2013  . Chronic pain 05/13/2012  . Diabetic gastroparesis (HCC) 11/06/2006  . Hot flashes 11/06/2006   Past Medical History:  Diagnosis Date  . Anemia   . Arthritis    knees, hands  . Asthma   . Chronic diastolic (congestive) heart failure (HCC)   . COPD (chronic obstructive pulmonary disease) (HCC)   . Diabetes mellitus without complication (HCC)    type 2  . Dysfunctional uterine bleeding   . GERD (gastroesophageal reflux disease)   . Hypertension   . Neuromuscular disorder (HCC)    neuropathy feet  . Seizures (HCC) 09/12/2017   pt states r/t stress and blood sugar - no meds last one 4 months ago, not seen neurologist  . Sickle cell trait (HCC)   . Smoker   . Vitamin D deficiency 10/2019  . Wears glasses     Family History  Problem Relation Age of Onset  . Diabetes Mother   . Hypertension Mother     Past Surgical History:  Procedure Laterality Date  . CESAREAN SECTION     x 1. for twins  . DILATION AND CURETTAGE OF UTERUS N/A 08/20/2019   Procedure: DILATATION AND CURETTAGE;  Surgeon: Allie Bossier, MD;  Location: [facility] OR;  Service: Gynecology;  Laterality: N/A;  . ENDOMETRIAL ABLATION N/A 08/20/2019   Procedure: Minerva Ablation;  Surgeon: Allie Bossier, MD;  Location: [facility] OR;  Service: Gynecology;  Laterality: N/A;  . EYE SURGERY Bilateral    laser right and cataract removed left eye  . RADIOLOGY WITH ANESTHESIA N/A 09/16/2019   Procedure: MRI WITH ANESTHESIA   L SPINE WITHOUT CONTRAST, T SPINE WITHOUT CONTRAST , CERVICAL WITHOUT CONTRAST;  Surgeon: Radiologist, Medication, MD;  Location: [facility] OR;  Service: Radiology;  Laterality: N/A;  . TUBAL LIGATION     interval BTL  . UPPER GI ENDOSCOPY  07/2017   Social History   Occupational History  . Occupation: unemployed  Tobacco Use  . Smoking status: Current Every Day Smoker    Packs/day: 0.25    Years: 26.00    Pack years: 6.50    Types: Cigarettes  . Smokeless tobacco: Never Used  . Tobacco comment: 4-5 cigarettes/day  Vaping Use  . Vaping Use: Never used  Substance and Sexual Activity  . Alcohol use: No  . Drug use: No  . Sexual activity: Not Currently    Birth control/protection: None

## 2020-08-31 ENCOUNTER — Encounter: Payer: Self-pay | Admitting: Family Medicine

## 2020-08-31 NOTE — Progress Notes (Signed)
Subjective: 44 y.o. female presenting today for evaluation of bilateral lower extremity pain.  Patient mentioned today that she fell approximately 3 weeks ago when her right leg gave out and sustained a fall injury at home.  She states that she hit her head at the time and has had headaches that have been persistent since getting injured.  She believes that she lost consciousness at the time of injury.  This occurred approximately 3 weeks ago.  She states that she has an appointment with her PCP next week for evaluation regarding the head trauma.  Today she is requesting that she gets an MRI of both lower extremities  Past Medical History:  Diagnosis Date  . Anemia   . Arthritis    knees, hands  . Asthma   . Chronic diastolic (congestive) heart failure (HCC)   . COPD (chronic obstructive pulmonary disease) (HCC)   . Diabetes mellitus without complication (HCC)    type 2  . Dysfunctional uterine bleeding   . GERD (gastroesophageal reflux disease)   . Hypertension   . Neuromuscular disorder (HCC)    neuropathy feet  . Seizures (HCC) 09/12/2017   pt states r/t stress and blood sugar - no meds last one 4 months ago, not seen neurologist  . Sickle cell trait (HCC)   . Smoker   . Vitamin D deficiency 10/2019  . Wears glasses      Objective: Physical Exam General: The patient is alert and oriented x3 in no acute distress.  Dermatology: Skin is warm, dry and supple bilateral lower extremities. Negative for open lesions or macerations bilateral.   Vascular: Dorsalis Pedis and Posterior Tibial pulses palpable bilateral.  Capillary fill time is immediate to all digits.  Neurological: Epicritic and protective threshold intact bilateral.   Musculoskeletal: Tenderness to palpation to the plantar aspect of the right heel along the plantar fascia.  There is also tenderness to palpation along the insertion of the Achilles tendon right lower extremity.  All other joints range of motion within  normal limits bilateral. Strength 5/5 in all groups bilateral.   MRI impression 08/01/2017: 1. 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 2. Generalized soft tissue edema about the ankle and included foot. 3. No tendinopathy or acute ligamentous pathology crossing the ankle joint. 4. Small plantar calcaneal enthesophyte.  Assessment: 1. Plantar fasciitis right - chronic 2.  Achilles tendinitis right -chronic  Plan of Care:  1. Patient evaluated.    2.  Again the patient is requesting MRI of both lower extremities.  I explained to the patient that I do not suspect insurance would approve an MRI to the bilateral lower extremities.  There was only trauma to the right lower extremity.  Today we will get new x-rays and request MRI right lower extremity to see if it is approved. 3.  MRI ordered right lower extremity 4.  Follow-up with PCP next week for evaluation of head trauma and LOC at the time of incident 3 weeks ago 5.  Recommend going to the emergency department if headaches persist 6.  Return to clinic after MRI to review results   Felecia Shelling, DPM [facility]  Dr. Felecia Shelling, DPM    [address]  [place], [place] [address]                Office [phone]  Fax [phone]

## 2020-09-01 ENCOUNTER — Encounter: Payer: Self-pay | Admitting: Cardiology

## 2020-09-01 ENCOUNTER — Other Ambulatory Visit: Payer: Self-pay

## 2020-09-01 ENCOUNTER — Ambulatory Visit: Payer: Medicaid Other | Admitting: Cardiology

## 2020-09-01 VITALS — BP 142/77 | HR 84 | Resp 14 | Ht 64.0 in | Wt 249.0 lb

## 2020-09-01 DIAGNOSIS — I10 Essential (primary) hypertension: Secondary | ICD-10-CM

## 2020-09-01 DIAGNOSIS — R0789 Other chest pain: Secondary | ICD-10-CM

## 2020-09-01 DIAGNOSIS — F17200 Nicotine dependence, unspecified, uncomplicated: Secondary | ICD-10-CM

## 2020-09-01 DIAGNOSIS — E119 Type 2 diabetes mellitus without complications: Secondary | ICD-10-CM

## 2020-09-01 DIAGNOSIS — Z794 Long term (current) use of insulin: Secondary | ICD-10-CM

## 2020-09-01 DIAGNOSIS — I5032 Chronic diastolic (congestive) heart failure: Secondary | ICD-10-CM

## 2020-09-01 MED ORDER — SPIRONOLACTONE 50 MG PO TABS: 50.0000 mg | ORAL_TABLET | Freq: Every day | ORAL | 3 refills | Status: DC

## 2020-09-01 NOTE — Progress Notes (Signed)
Patient referred by Vevelyn Francois, NP for congestive heart failure  Subjective:   Jill Shaw, female    DOB: [birth year], 44 y.o.   MRN: [id number]   Chief Complaint  Patient presents with  . Congestive Heart Failure  . New Patient (Initial Visit)    Referred by Dionisio David     HPI  44 year old African-American female with hypertension, type 2 diabetes mellitus, HFpEF, COPD, tobacco dependence, morbid obesity, microcytic anemia, uterine dysfunction, recurrent syncope  Patient was previously been seen by Dr. Quay Burow at [facility].  Her syncopal episodes were thought to be vasovagal in nature, given absence of any arrhythmias noted on chemistry.  She did have demand ischemia in the past with troponin elevation that was thought to be secondary to heart failure.  Echocardiogram showed normal EF with grade 1 diastolic dysfunction suggesting HFpEF.  Dr. Gwenlyn Found had felt that she did not need disability from cardiac standpoint.  Patient is now here to see me as second opinion.  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  She reports that she gets short of breath with minimal activity, such as walking his steps.  She also reports excellent orthopnea.  Denies any PND.  She has episodes of chest pain lasting for 3 minutes with or without exertion.  She does have occasional leg swelling.  She takes up to 4 pills of Lasix 40 mg every day for her leg swelling.  Unfortunately, he continues to smoke.  She is now down to 3 cigarettes a day with use of nicotine patches.  Past Medical History:  Diagnosis Date  . Anemia   . Arthritis    knees, hands  . Asthma   . Chronic diastolic (congestive) heart failure ([place])   . COPD (chronic obstructive pulmonary disease) ([place])   . Diabetes mellitus without complication ([place])    type 2  . Dysfunctional uterine bleeding   . GERD (gastroesophageal reflux disease)   . Hypertension   .  Neuromuscular disorder (HCC)    neuropathy feet  . Seizures ([place]) 09/12/2017   pt states r/t stress and blood sugar - no meds last one 4 months ago, not seen neurologist  . Sickle cell trait ([place])   . Smoker   . Vitamin D deficiency 10/2019  . Wears glasses      Past Surgical History:  Procedure Laterality Date  . CESAREAN SECTION     x 1. for twins  . DILATION AND CURETTAGE OF UTERUS N/A 08/20/2019   Procedure: DILATATION AND CURETTAGE;  Surgeon: Emily Filbert, MD;  Location: [facility];  Service: Gynecology;  Laterality: N/A;  . ENDOMETRIAL ABLATION N/A 08/20/2019   Procedure: [place] Ablation;  Surgeon: Emily Filbert, MD;  Location: [facility];  Service: Gynecology;  Laterality: N/A;  . EYE SURGERY Bilateral    laser right and cataract removed left eye  . RADIOLOGY WITH ANESTHESIA N/A 09/16/2019   Procedure: MRI WITH ANESTHESIA   L SPINE WITHOUT CONTRAST, T SPINE WITHOUT CONTRAST , CERVICAL WITHOUT CONTRAST;  Surgeon: Radiologist, Medication, MD;  Location: [facility];  Service: Radiology;  Laterality: N/A;  . TUBAL LIGATION     interval BTL  . UPPER GI ENDOSCOPY  07/2017     Social History   Tobacco Use  Smoking Status Current Every Day Smoker  . Packs/day: 0.25  . Years: 26.00  . Pack years: 6.50  . Types: Cigarettes  Smokeless Tobacco Never Used  Tobacco Comment   4-5 cigarettes/day    Social History   Substance and Sexual Activity  Alcohol Use No     Family History  Problem Relation Age of Onset  . Diabetes Mother   . Hypertension Mother      Current Outpatient Medications on File Prior to Visit  Medication Sig Dispense Refill  . albuterol (PROVENTIL) (2.5 MG/3ML) 0.083% nebulizer solution Take 3 mLs (2.5 mg total) by nebulization every 6 (six) hours as needed for wheezing or shortness of breath. 150 mL 3  . albuterol (VENTOLIN HFA) 108 (90 Base) MCG/ACT inhaler Inhale 2 puffs into the lungs every 6 (six) hours as needed for wheezing or shortness of breath. 8 g 3  .  baclofen (LIORESAL) 10 MG tablet Take 5-10 mg by mouth 3 (three) times daily as needed.    . blood glucose meter kit and supplies KIT Dispense based on patient and insurance preference. Use up to four times daily as directed. (FOR ICD-9 250.00, 250.01). (Patient taking differently: 1 each by Other route See admin instructions. Dispense based on patient and insurance preference. Use up to four times daily as directed. (FOR ICD-9 250.00, 250.01).) 1 each 0  . Blood Glucose Monitoring Suppl (TRUE METRIX METER) w/Device KIT 1 each by Does not apply route 4 (four) times daily -  before meals and at bedtime. 1 kit 0  . budesonide-formoterol (SYMBICORT) 160-4.5 MCG/ACT inhaler INHALE 2 PUFFS INTO THE LUNGS 2 (TWO) TIMES DAILY. (Patient taking differently: Inhale 2 puffs into the lungs 2 (two) times daily. ) 10.2 g 3  . buPROPion (WELLBUTRIN SR) 150 MG 12 hr tablet Take 1 tablet (150 mg total) by mouth 2 (two) times daily. 60 tablet 11  . cetirizine (ZYRTEC) 10 MG tablet Take 1 tablet (10 mg total) by mouth daily. 30 tablet 3  . Continuous Blood Gluc Receiver (FREESTYLE LIBRE 14 DAY READER) DEVI 1 application by Does not apply route every 14 (fourteen) days. 1 each 0  . diclofenac (VOLTAREN) 75 MG EC tablet Take 75 mg by mouth 2 (two) times daily as needed.    . ferrous sulfate 325 (65 FE) MG tablet Take 1 tablet (325 mg total) by mouth 3 (three) times daily with meals. (Patient taking differently: Take 325 mg by mouth 2 (two) times daily with a meal. ) 30 tablet 3  . fluticasone (FLONASE) 50 MCG/ACT nasal spray Place 2 sprays into both nostrils daily. 16 g 6  . furosemide (LASIX) 40 MG tablet Take 1 tablet (40 mg total) by mouth 2 (two) times daily. 60 tablet 5  . Glucosamine Sulfate 1000 MG CAPS Take 1 capsule (1,000 mg total) by mouth 2 (two) times daily. 180 capsule 3  . glucose blood (TRUE METRIX BLOOD GLUCOSE TEST) test strip Use as instructed 400 each 12  . HYDROmorphone (DILAUDID) 2 MG tablet Take 1-2  tablets (2-4 mg total) by mouth every 4 (four) hours as needed for severe pain. 30 tablet 0  . hydroquinone 4 % cream Apply topically 2 (two) times daily. 28.35 g 0  . Insulin Lispro Prot & Lispro (HUMALOG MIX 75/25 KWIKPEN) (75-25) 100 UNIT/ML Kwikpen INJECT 110 UNITS EVERY 12 HOURS 30 mL 11  . Insulin Pen Needle (PEN NEEDLES) 30G X 5 MM MISC 1 Units by Does not apply route as directed. 100 each 11  . losartan (COZAAR) 50 MG tablet Take 1 tablet (50 mg total) by mouth daily. 30 tablet 2  .  meloxicam (MOBIC) 7.5 MG tablet Take 1 tablet (7.5 mg total) by mouth daily. 30 tablet 1  . metoCLOPramide (REGLAN) 10 MG tablet Take 1 tablet (10 mg total) by mouth 4 (four) times daily -  before meals and at bedtime. 120 tablet 1  . metoprolol succinate (TOPROL-XL) 100 MG 24 hr tablet Take 1 tablet (100 mg total) by mouth daily. Take with or immediately following a meal. 90 tablet 3  . nicotine polacrilex (COMMIT) 4 MG lozenge Take 1 lozenge (4 mg total) by mouth as needed for smoking cessation. 108 tablet 3  . norethindrone (AYGESTIN) 5 MG tablet Take 2 tablets (10 mg total) by mouth in the morning, at noon, in the evening, and at bedtime. With bleeding 120 tablet 0  . omeprazole (PRILOSEC) 40 MG capsule TAKE 1 CAPSULE (40 MG TOTAL) BY MOUTH DAILY. 30 capsule 2  . potassium chloride SA (KLOR-CON) 20 MEQ tablet Take 1 tablet (20 mEq total) by mouth daily. 30 tablet 3  . promethazine (PHENERGAN) 12.5 MG tablet Take 1 tablet (12.5 mg total) by mouth every 6 (six) hours as needed for nausea or vomiting. 15 tablet 0  . rosuvastatin (CRESTOR) 5 MG tablet Take 1 tablet (5 mg total) by mouth daily. 90 tablet 3  . saxagliptin HCl (ONGLYZA) 2.5 MG TABS tablet Take 1 tablet (2.5 mg total) by mouth daily. 90 tablet 3  . sucralfate (CARAFATE) 1 GM/10ML suspension Take 10 mLs (1 g total) by mouth 4 (four) times daily -  with meals and at bedtime. 420 mL 0  . Tiotropium Bromide Monohydrate (SPIRIVA RESPIMAT) 2.5 MCG/ACT AERS  Inhale 2 puffs into the lungs daily. 4 g 0  . tizanidine (ZANAFLEX) 6 MG capsule Take 1 capsule (6 mg total) by mouth 3 (three) times daily. 90 capsule 2  . TRUEplus Lancets 28G MISC 1 each by Other route in the morning, at noon, in the evening, and at bedtime.    . Turmeric 500 MG CAPS Take 500 mg by mouth 2 (two) times daily. 180 capsule 3  . Vitamin D, Ergocalciferol, (DRISDOL) 1.25 MG (50000 UNIT) CAPS capsule Take 1 capsule (50,000 Units total) by mouth every 7 (seven) days. (Patient taking differently: Take 50,000 Units by mouth every Friday. ) 5 capsule 6  . Cholecalciferol (VITAMIN D-3) 125 MCG (5000 UT) TABS Take 1 tablet by mouth daily. (Patient taking differently: Take 5,000 Units by mouth daily. ) 90 tablet 3  . diclofenac Sodium (VOLTAREN) 1 % GEL SMARTSIG:4 Gram(s) Topical 4 Times Daily PRN    . nicotine (NICODERM CQ) 21 mg/24hr patch Place 1 patch (21 mg total) onto the skin daily. 28 patch 3  . [DISCONTINUED] hydrALAZINE (APRESOLINE) 50 MG tablet Take 1 tablet (50 mg total) by mouth 3 (three) times daily. 90 tablet 0   No current facility-administered medications on file prior to visit.    Cardiovascular and other pertinent studies:  EKG 09/01/2020: Sinus rhythm 85 bpm  Poor R wave progression Otherwise normal EKG  Vascular US 07/16/2020: No DVT  Echocardiogram 04/30/2020: 1. Left ventricular ejection fraction, by estimation, is 55 to 60%. The  left ventricle has normal function. The left ventricle has no regional  wall motion abnormalities. There is moderate concentric left ventricular  hypertrophy. Left ventricular  diastolic parameters are consistent with Grade I diastolic dysfunction  (impaired relaxation). Elevated left ventricular end-diastolic pressure.  2. Right ventricular systolic function is normal. The right ventricular  size is normal.  3. The mitral valve is  normal in structure. Trivial mitral valve  regurgitation. No evidence of mitral stenosis.  4.  The aortic valve is normal in structure. Aortic valve regurgitation is  not visualized. No aortic stenosis is present.  5. The inferior vena cava is dilated in size with <50% respiratory  variability, suggesting right atrial pressure of 15 mmHg.    Recent labs: 07/26/2020: Glucose 160, BUN/Cr 10/1.13. EGFR 60. Na/K 140/4.0. Rest of the CMP normal H/H 9.5/34.7. MCV 70. Platelets 311 HbA1C 9.2% Chol 135, TG 166, HDL 29, LDL 77   Review of Systems  Cardiovascular: Positive for chest pain, dyspnea on exertion, leg swelling and orthopnea. Negative for palpitations and syncope.         Vitals:   09/01/20 1322  BP: (!) 142/77  Pulse: 84  Resp: 14  SpO2: 99%     Body mass index is 42.74 kg/m. Filed Weights   09/01/20 1322  Weight: 249 lb (112.9 kg)     Objective:   Physical Exam Vitals and nursing note reviewed.  Constitutional:      General: She is not in acute distress. Neck:     Vascular: No JVD.  Cardiovascular:     Rate and Rhythm: Normal rate and regular rhythm.     Pulses: Normal pulses.     Heart sounds: Normal heart sounds. No murmur heard.   Pulmonary:     Effort: Pulmonary effort is normal.     Breath sounds: Normal breath sounds. No wheezing or rales.  Musculoskeletal:     Right lower leg: Edema (Trace) present.     Left lower leg: Edema (Trace) present.         Assessment & Recommendations:   44 year old African-American female with hypertension, type 2 diabetes mellitus, HFpEF, COPD, tobacco dependence, morbid obesity, microcytic anemia, uterine dysfunction, recurrent syncope  1. Chronic heart failure with preserved ejection fraction (HCC) Likely related to hypertension, obesity, controlled type 2 diabetes mellitus.  Other differential for her dyspnea is microcytic anemia.  Defer management to PCP.  From heart failure standpoint, added spironolactone 50 mg daily.  Hopefully, she will not have to use as much Lasix with concurrent use of  spironolactone and losartan.  Check BMP and BNP in 1 week.  Recommend maintaining a log of blood pressures and daily weights. Reduce salt intake to <2 g.   2. Atypical chest pain: Low suspicion for angina. If symptoms do not improve after control of hypertension and HFpEF, could consider stress testing.   3. Essential hypertension Uncontrolled. Hopefully, spironolactone will help.   4. Controlled type 2 diabetes mellitus without complication, with long-term current use of insulin ([place]) Management as per PCP.  5. Tobacco dependence: Tobacco cessation counseling:  - Currently smoking <1/2 packs/day   - Patient was informed of the dangers of tobacco abuse including stroke, cancer, and MI, as well as benefits of tobacco cessation. - Patient is willing to quit at this time. - Approximately 5 mins were spent counseling patient cessation techniques. We discussed various methods to help quit smoking, including deciding on a date to quit, joining a support group, pharmacological agents. Patient would like to use nicotine patch. - I will reassess her progress at the next follow-up visit  Extensive review of prior medical records and testing, including independent interpretation.   Thank you for referring the patient to [place]. Please feel free to contact with any questions.   Nigel Mormon, MD Pager: [phone] Office: [phone]

## 2020-09-02 ENCOUNTER — Ambulatory Visit (INDEPENDENT_AMBULATORY_CARE_PROVIDER_SITE_OTHER): Payer: Medicaid Other | Admitting: Nurse Practitioner

## 2020-09-02 ENCOUNTER — Encounter: Payer: Self-pay | Admitting: Nurse Practitioner

## 2020-09-02 VITALS — BP 168/76 | HR 96 | Temp 98.4°F | Resp 20 | Ht 64.0 in | Wt 274.8 lb

## 2020-09-02 DIAGNOSIS — IMO0002 Reserved for concepts with insufficient information to code with codable children: Secondary | ICD-10-CM

## 2020-09-02 DIAGNOSIS — E119 Type 2 diabetes mellitus without complications: Secondary | ICD-10-CM

## 2020-09-02 DIAGNOSIS — E1122 Type 2 diabetes mellitus with diabetic chronic kidney disease: Secondary | ICD-10-CM | POA: Diagnosis not present

## 2020-09-02 DIAGNOSIS — I5032 Chronic diastolic (congestive) heart failure: Secondary | ICD-10-CM

## 2020-09-02 DIAGNOSIS — E1165 Type 2 diabetes mellitus with hyperglycemia: Secondary | ICD-10-CM

## 2020-09-02 DIAGNOSIS — I10 Essential (primary) hypertension: Secondary | ICD-10-CM

## 2020-09-02 DIAGNOSIS — J449 Chronic obstructive pulmonary disease, unspecified: Secondary | ICD-10-CM

## 2020-09-02 LAB — GLUCOSE, POCT (MANUAL RESULT ENTRY): POC Glucose: 194 mg/dl — AB (ref 70–99)

## 2020-09-02 MED ORDER — BLOOD GLUCOSE MONITOR KIT: 1.0000 | PACK | 2 refills | Status: DC

## 2020-09-02 NOTE — Progress Notes (Signed)
[place], [place]  [address] Phone:  [phone]   Fax:  [phone]   Established Patient Office Visit  Subjective:  Patient ID: Jill Shaw, female    DOB: 1976-06-13  Age: 44 y.o. MRN: [id number]  CC:  Chief Complaint  Patient presents with   Follow-up    HPI Jill Shaw presents for follow up. She  has a past medical history of Anemia, Arthritis, Asthma, Chronic diastolic (congestive) heart failure ([place]), COPD (chronic obstructive pulmonary disease) ([place]), Diabetes mellitus without complication ([place]), Dysfunctional uterine bleeding, GERD (gastroesophageal reflux disease), Hypertension, Neuromuscular disorder ([place]), Seizures ([place]) (09/12/2017), Sickle cell trait ([place]), Smoker, Vitamin D deficiency (10/2019), and Wears glasses.   Diabetes Mellitus Patient presents for follow up of diabetes. Current symptoms include: hyperglycemia and paresthesia of the feet. Symptoms have progressed to a point and plateaued. Patient denies foot ulcerations, hypoglycemia , nausea and vomiting. Evaluation to date has included: fasting blood sugar, fasting lipid panel, hemoglobin A1C and microalbuminuria.  Home sugars: BGs are high in the morning, BGs are high in the evening, BGs are high around dinner. Current treatment: Continued insulin which has been somewhat effective, Continued statin which has been somewhat effective, Continued ACE inhibitor/ARB which has been somewhat effective and Continued saxaglipitin which has been somewhat effective.  She admits that she has made some dietary changes.  She is hopeful she was unable to get the freestyle libre approved through [place].    Past Medical History:  Diagnosis Date   Anemia    Arthritis    knees, hands   Asthma    Chronic diastolic (congestive) heart failure (HCC)    COPD (chronic obstructive pulmonary disease) (HCC)    Diabetes mellitus without complication (HCC)    type 2    Dysfunctional uterine bleeding    GERD (gastroesophageal reflux disease)    Hypertension    Neuromuscular disorder ([place])    neuropathy feet   Seizures ([place]) 09/12/2017   pt states r/t stress and blood sugar - no meds last one 4 months ago, not seen neurologist   Sickle cell trait ([place])    Smoker    Vitamin D deficiency 10/2019   Wears glasses     Past Surgical History:  Procedure Laterality Date   CESAREAN SECTION     x 1. for twins   DILATION AND CURETTAGE OF UTERUS N/A 08/20/2019   Procedure: DILATATION AND CURETTAGE;  Surgeon: Emily Filbert, MD;  Location: [facility];  Service: Gynecology;  Laterality: N/A;   ENDOMETRIAL ABLATION N/A 08/20/2019   Procedure: [place] Ablation;  Surgeon: Emily Filbert, MD;  Location: [facility];  Service: Gynecology;  Laterality: N/A;   EYE SURGERY Bilateral    laser right and cataract removed left eye   RADIOLOGY WITH ANESTHESIA N/A 09/16/2019   Procedure: MRI WITH ANESTHESIA   L SPINE WITHOUT CONTRAST, T SPINE WITHOUT CONTRAST , CERVICAL WITHOUT CONTRAST;  Surgeon: Radiologist, Medication, MD;  Location: [facility];  Service: Radiology;  Laterality: N/A;   TUBAL LIGATION     interval BTL   UPPER GI ENDOSCOPY  07/2017    Family History  Problem Relation Age of Onset   Diabetes Mother    Hypertension Mother     Social History   Socioeconomic History   Marital status: Legally Separated    Spouse name: Not on file   Number of children: 3   Years of education: Not on file  Highest education level: Not on file  Occupational History   Occupation: unemployed  Tobacco Use   Smoking status: Current Some Day Smoker    Packs/day: 0.25    Years: 26.00    Pack years: 6.50    Types: Cigarettes   Smokeless tobacco: Never Used   Tobacco comment: 4-5 cigarettes/day  Vaping Use   Vaping Use: Never used  Substance and Sexual Activity   Alcohol use: No   Drug use: No   Sexual activity: Not Currently    Birth control/protection: None   Other Topics Concern   Not on file  Social History Narrative   Right Handed   Lives in a one story apartment, but lives on the second floor   Drinks caffeine once in awhile   Social Determinants of Health   Financial Resource Strain:    Difficulty of Paying Living Expenses: Not on file  Food Insecurity: No Food Insecurity   Worried About Running Out of Food in the Last Year: Never true   Ran Out of Food in the Last Year: Never true  Transportation Needs: No Transportation Needs   Lack of Transportation (Medical): No   Lack of Transportation (Non-Medical): No  Physical Activity:    Days of Exercise per Week: Not on file   Minutes of Exercise per Session: Not on file  Stress:    Feeling of Stress : Not on file  Social Connections:    Frequency of Communication with Friends and Family: Not on file   Frequency of Social Gatherings with Friends and Family: Not on file   Attends Religious Services: Not on file   Active Member of Clubs or Organizations: Not on file   Attends Archivist Meetings: Not on file   Marital Status: Not on file  Intimate Partner Violence:    Fear of Current or Ex-Partner: Not on file   Emotionally Abused: Not on file   Physically Abused: Not on file   Sexually Abused: Not on file    Outpatient Medications Prior to Visit  Medication Sig Dispense Refill   albuterol (PROVENTIL) (2.5 MG/3ML) 0.083% nebulizer solution Take 3 mLs (2.5 mg total) by nebulization every 6 (six) hours as needed for wheezing or shortness of breath. 150 mL 3   albuterol (VENTOLIN HFA) 108 (90 Base) MCG/ACT inhaler Inhale 2 puffs into the lungs every 6 (six) hours as needed for wheezing or shortness of breath. 8 g 3   baclofen (LIORESAL) 10 MG tablet Take 5-10 mg by mouth 3 (three) times daily as needed.     Blood Glucose Monitoring Suppl (TRUE METRIX METER) w/Device KIT 1 each by Does not apply route 4 (four) times daily -  before meals and at bedtime.  1 kit 0   budesonide-formoterol (SYMBICORT) 160-4.5 MCG/ACT inhaler INHALE 2 PUFFS INTO THE LUNGS 2 (TWO) TIMES DAILY. (Patient taking differently: Inhale 2 puffs into the lungs 2 (two) times daily. ) 10.2 g 3   buPROPion (WELLBUTRIN SR) 150 MG 12 hr tablet Take 1 tablet (150 mg total) by mouth 2 (two) times daily. 60 tablet 11   cetirizine (ZYRTEC) 10 MG tablet Take 1 tablet (10 mg total) by mouth daily. 30 tablet 3   Cholecalciferol (VITAMIN D-3) 125 MCG (5000 UT) TABS Take 1 tablet by mouth daily. (Patient taking differently: Take 5,000 Units by mouth daily. ) 90 tablet 3   Continuous Blood Gluc Receiver (FREESTYLE LIBRE 14 DAY READER) DEVI 1 application by Does not apply route every 14 (  fourteen) days. 1 each 0   diclofenac (VOLTAREN) 75 MG EC tablet Take 75 mg by mouth 2 (two) times daily as needed.     diclofenac Sodium (VOLTAREN) 1 % GEL SMARTSIG:4 Gram(s) Topical 4 Times Daily PRN     ferrous sulfate 325 (65 FE) MG tablet Take 1 tablet (325 mg total) by mouth 3 (three) times daily with meals. (Patient taking differently: Take 325 mg by mouth 2 (two) times daily with a meal. ) 30 tablet 3   fluticasone (FLONASE) 50 MCG/ACT nasal spray Place 2 sprays into both nostrils daily. 16 g 6   furosemide (LASIX) 40 MG tablet Take 1 tablet (40 mg total) by mouth 2 (two) times daily. 60 tablet 5   Glucosamine Sulfate 1000 MG CAPS Take 1 capsule (1,000 mg total) by mouth 2 (two) times daily. 180 capsule 3   glucose blood (TRUE METRIX BLOOD GLUCOSE TEST) test strip Use as instructed 400 each 12   HYDROmorphone (DILAUDID) 2 MG tablet Take 1-2 tablets (2-4 mg total) by mouth every 4 (four) hours as needed for severe pain. 30 tablet 0   hydroquinone 4 % cream Apply topically 2 (two) times daily. 28.35 g 0   Insulin Lispro Prot & Lispro (HUMALOG MIX 75/25 KWIKPEN) (75-25) 100 UNIT/ML Kwikpen INJECT 110 UNITS EVERY 12 HOURS 30 mL 11   Insulin Pen Needle (PEN NEEDLES) 30G X 5 MM MISC 1 Units by  Does not apply route as directed. 100 each 11   losartan (COZAAR) 50 MG tablet Take 1 tablet (50 mg total) by mouth daily. 30 tablet 2   meloxicam (MOBIC) 7.5 MG tablet Take 1 tablet (7.5 mg total) by mouth daily. 30 tablet 1   metoCLOPramide (REGLAN) 10 MG tablet Take 1 tablet (10 mg total) by mouth 4 (four) times daily -  before meals and at bedtime. 120 tablet 1   metoprolol succinate (TOPROL-XL) 100 MG 24 hr tablet Take 1 tablet (100 mg total) by mouth daily. Take with or immediately following a meal. 90 tablet 3   nicotine (NICODERM CQ) 21 mg/24hr patch Place 1 patch (21 mg total) onto the skin daily. 28 patch 3   nicotine polacrilex (COMMIT) 4 MG lozenge Take 1 lozenge (4 mg total) by mouth as needed for smoking cessation. 108 tablet 3   norethindrone (AYGESTIN) 5 MG tablet Take 2 tablets (10 mg total) by mouth in the morning, at noon, in the evening, and at bedtime. With bleeding 120 tablet 0   omeprazole (PRILOSEC) 40 MG capsule TAKE 1 CAPSULE (40 MG TOTAL) BY MOUTH DAILY. 30 capsule 2   promethazine (PHENERGAN) 12.5 MG tablet Take 1 tablet (12.5 mg total) by mouth every 6 (six) hours as needed for nausea or vomiting. 15 tablet 0   rosuvastatin (CRESTOR) 5 MG tablet Take 1 tablet (5 mg total) by mouth daily. 90 tablet 3   saxagliptin HCl (ONGLYZA) 2.5 MG TABS tablet Take 1 tablet (2.5 mg total) by mouth daily. 90 tablet 3   spironolactone (ALDACTONE) 50 MG tablet Take 1 tablet (50 mg total) by mouth daily. 30 tablet 3   sucralfate (CARAFATE) 1 GM/10ML suspension Take 10 mLs (1 g total) by mouth 4 (four) times daily -  with meals and at bedtime. 420 mL 0   Tiotropium Bromide Monohydrate (SPIRIVA RESPIMAT) 2.5 MCG/ACT AERS Inhale 2 puffs into the lungs daily. 4 g 0   tizanidine (ZANAFLEX) 6 MG capsule Take 1 capsule (6 mg total) by mouth 3 (three) times  daily. 90 capsule 2   TRUEplus Lancets 28G MISC 1 each by Other route in the morning, at noon, in the evening, and at bedtime.      Turmeric 500 MG CAPS Take 500 mg by mouth 2 (two) times daily. 180 capsule 3   Vitamin D, Ergocalciferol, (DRISDOL) 1.25 MG (50000 UNIT) CAPS capsule Take 1 capsule (50,000 Units total) by mouth every 7 (seven) days. (Patient taking differently: Take 50,000 Units by mouth every Friday. ) 5 capsule 6   blood glucose meter kit and supplies KIT Dispense based on patient and insurance preference. Use up to four times daily as directed. (FOR ICD-9 250.00, 250.01). (Patient taking differently: 1 each by Other route See admin instructions. Dispense based on patient and insurance preference. Use up to four times daily as directed. (FOR ICD-9 250.00, 250.01).) 1 each 0   No facility-administered medications prior to visit.    Allergies  Allergen Reactions   Ketoprofen Nausea And Vomiting   Aspirin Nausea Only   Gabapentin Nausea And Vomiting and Other (See Comments)    upset stomach   Ibuprofen Nausea And Vomiting   Liraglutide Nausea And Vomiting   Naproxen Nausea And Vomiting   Omeprazole-Sodium Bicarbonate Nausea And Vomiting   Sulfa Antibiotics Nausea And Vomiting   Tramadol Nausea And Vomiting and Other (See Comments)    stomach upset    ROS Review of Systems    Objective:    Physical Exam Constitutional:      General: She is not in acute distress.    Appearance: She is obese. She is not ill-appearing, toxic-appearing or diaphoretic.  HENT:     Head: Normocephalic and atraumatic.     Nose: Nose normal.     Mouth/Throat:     Mouth: Mucous membranes are moist.  Cardiovascular:     Rate and Rhythm: Normal rate and regular rhythm.     Pulses: Normal pulses.     Heart sounds: Normal heart sounds.  Musculoskeletal:        General: Normal range of motion.     Cervical back: Normal range of motion.  Skin:    General: Skin is warm and dry.     Capillary Refill: Capillary refill takes less than 2 seconds.  Neurological:     General: No focal deficit present.      Mental Status: She is alert and oriented to person, place, and time.  Psychiatric:        Mood and Affect: Mood normal.        Behavior: Behavior normal.        Thought Content: Thought content normal.        Judgment: Judgment normal.     BP (!) 168/76    Pulse 96    Temp 98.4 F (36.9 C) (Temporal)    Resp 20    Ht 5' 4" (1.626 m)    Wt 274 lb 12.8 oz (124.6 kg) Comment: confirmed by scale recall   SpO2 100%    BMI 47.17 kg/m  Wt Readings from Last 3 Encounters:  09/02/20 274 lb 12.8 oz (124.6 kg)  09/01/20 249 lb (112.9 kg)  08/19/20 274 lb 6.4 oz (124.5 kg)     Health Maintenance Due  Topic Date Due   PAP SMEAR-Modifier  07/25/2020    There are no preventive care reminders to display for this patient.  Lab Results  Component Value Date   TSH 3.100 11/05/2019   Lab Results  Component Value Date  WBC 13.0 (H) 08/11/2020   HGB 9.5 (L) 08/11/2020   HCT 34.7 08/11/2020   MCV 70 (L) 08/11/2020   PLT 311 08/11/2020   Lab Results  Component Value Date   NA 140 07/26/2020   K 4.0 07/26/2020   CO2 22 07/26/2020   GLUCOSE 160 (H) 07/26/2020   BUN 10 07/26/2020   CREATININE 1.13 (H) 07/26/2020   BILITOT 0.4 07/12/2020   ALKPHOS 70 07/12/2020   AST 26 07/12/2020   ALT 10 07/12/2020   PROT 7.2 07/12/2020   ALBUMIN 4.0 07/12/2020   CALCIUM 8.7 07/26/2020   ANIONGAP 11 07/04/2020   GFR 57.50 (L) 07/12/2020   Lab Results  Component Value Date   CHOL 135 06/02/2020   Lab Results  Component Value Date   HDL 29 (L) 06/02/2020   Lab Results  Component Value Date   LDLCALC 77 06/02/2020   Lab Results  Component Value Date   TRIG 166 (H) 06/02/2020   Lab Results  Component Value Date   CHOLHDL 4.7 (H) 06/02/2020   Lab Results  Component Value Date   HGBA1C 9.2 (A) 07/19/2020      Assessment & Plan:   Problem List Items Addressed This Visit      Cardiovascular and Mediastinum   Chronic diastolic CHF (congestive heart failure) ([place]) Encourage  patient to make sure that she is monitoring her weights daily to help with her treatment plan and continue to follow-up with cardiology as scheduled   Essential hypertension (Chronic) Stable continue with current regimen follows up with cardiology regularly     Respiratory   Chronic obstructive pulmonary disease ([place]) Discussed the risk factors associated with smoking ; CAD, COPD, Cancer, PVD increased susceptibility to respiratory illnesses Discussed treatment options with cessation ie counseling, support resources and available medications  Choosing a quit day and setting goals accordingly. Discussed ways to quit; start by decreasing one cigarette per day or per week.  Counseling 5-10 minutes      Other   Morbid obesity ([place]) Obesity with BMI and comorbidities as noted above.  Discussed proper diet (low fat, low sodium, high fiber) with patient.   Discussed need for regular exercise (3 times per week, 20 minutes per session) with patient.     Other Visit Diagnoses    Uncontrolled type 2 diabetes mellitus with chronic kidney disease ([place])    -  Primary Encourage compliance with current treatment regimen no additional adjustment in therapy.  Continue to encourage lifestyle modifications healthy diet (fewer calories, more high fiber foods, whole grains and non-starchy vegetables, lower fat meat and fish, low-fat diary include healthy oils) regular exercise (physical activity) and weight loss Encourage regular CBG monitoring Encourage contacting office if excessive hyperglycemia and or hypoglycemia Nutritional consult recommended Home BP monitoring also encouraged goal <130/80     Relevant Medications   blood glucose meter kit and supplies KIT   Other Relevant Orders   Glucose (CBG) (Completed)      Meds ordered this encounter  Medications   blood glucose meter kit and supplies KIT    Sig: 1 each by Other route See admin instructions. Dispense based on patient and insurance  preference. Use up to four times daily as directed. (FOR ICD-9 250.00, 250.01).    Dispense:  1 each    Refill:  2    Order Specific Question:   Supervising Provider    Answer:   Tresa Garter [id number]    Order Specific Question:   Number  of strips    Answer:   100    Order Specific Question:   Number of lancets    Answer:   100  All  Follow-up: Return in about 3 months (around 12/03/2020).    Vevelyn Francois, NP

## 2020-09-07 NOTE — Telephone Encounter (Signed)
Order was faxed to [facility] with [facility] scheduling, they will contact pt tos [place] appt

## 2020-09-09 ENCOUNTER — Encounter: Payer: Self-pay | Admitting: Nurse Practitioner

## 2020-09-11 ENCOUNTER — Encounter: Payer: Self-pay | Admitting: Internal Medicine

## 2020-09-12 ENCOUNTER — Other Ambulatory Visit: Payer: Self-pay | Admitting: Nurse Practitioner

## 2020-09-12 DIAGNOSIS — L819 Disorder of pigmentation, unspecified: Secondary | ICD-10-CM

## 2020-09-13 ENCOUNTER — Other Ambulatory Visit: Payer: Self-pay | Admitting: Podiatry

## 2020-09-13 ENCOUNTER — Other Ambulatory Visit: Payer: Self-pay | Admitting: Nurse Practitioner

## 2020-09-13 DIAGNOSIS — E119 Type 2 diabetes mellitus without complications: Secondary | ICD-10-CM

## 2020-09-13 DIAGNOSIS — Z794 Long term (current) use of insulin: Secondary | ICD-10-CM

## 2020-09-13 MED ORDER — INSULIN LISPRO PROT & LISPRO (75-25 MIX) 100 UNIT/ML KWIKPEN: 120.0000 [IU] | PEN_INJECTOR | Freq: Two times a day (BID) | SUBCUTANEOUS | 11 refills | Status: DC

## 2020-09-13 NOTE — Telephone Encounter (Signed)
Please see patient refill request.

## 2020-09-14 ENCOUNTER — Ambulatory Visit
Admission: RE | Admit: 2020-09-14 | Discharge: 2020-09-14 | Disposition: A | Discharge status: Home / Self Care | Payer: Medicaid Other | Source: Ambulatory Visit | Attending: Podiatry | Admitting: Podiatry

## 2020-09-14 ENCOUNTER — Other Ambulatory Visit: Payer: Self-pay

## 2020-09-14 DIAGNOSIS — M7661 Achilles tendinitis, right leg: Secondary | ICD-10-CM

## 2020-09-14 DIAGNOSIS — M7731 Calcaneal spur, right foot: Secondary | ICD-10-CM | POA: Diagnosis not present

## 2020-09-14 DIAGNOSIS — G8929 Other chronic pain: Secondary | ICD-10-CM | POA: Diagnosis not present

## 2020-09-15 ENCOUNTER — Other Ambulatory Visit: Payer: Self-pay

## 2020-09-15 ENCOUNTER — Telehealth: Payer: Self-pay | Admitting: Neurology

## 2020-09-15 DIAGNOSIS — 419620001 Death: Secondary | SNOMED CT | POA: Diagnosis not present

## 2020-09-15 NOTE — Telephone Encounter (Signed)
Patient called and said, "Is there a type of cream that can help with the numbness in her feet and her right hand?" She said she doesn't see the pain management doctor until the end of the month."  [facility]

## 2020-09-15 DEATH — deceased

## 2020-09-16 ENCOUNTER — Other Ambulatory Visit: Payer: Self-pay | Admitting: Nurse Practitioner

## 2020-09-16 ENCOUNTER — Telehealth: Payer: Self-pay

## 2020-09-16 MED ORDER — TIZANIDINE HCL 2 MG PO TABS: 6.0000 mg | ORAL_TABLET | Freq: Three times a day (TID) | ORAL | 3 refills | Status: DC | PRN

## 2020-09-16 MED ORDER — LIDOCAINE 5 % EX OINT: 1.0000 "application " | TOPICAL_OINTMENT | CUTANEOUS | 0 refills | Status: DC | PRN

## 2020-09-16 NOTE — Telephone Encounter (Signed)
Pt called no answer left a voice mail for pt to call back  

## 2020-09-16 NOTE — Telephone Encounter (Signed)
Pt called and informed that lidocaine ointment will be sent in by Dr Allena Katz , but she needs to be aware that her insurance may not cover the cost.  Her best option is OTC lidocaine ointment or capsaicin cream. Pt verbalized  Understanding

## 2020-09-16 NOTE — Telephone Encounter (Signed)
PA for tizanidine 6mg  capsules denied, per [facility] Medicaid script for tizanidine 2mg  tablets with sig of 3 tablets (to equal 6mg ) TID PRN will be covered w/o PA, pls send script for this to pharmacy if appropriate, will contact pt and inform of change once script sent.

## 2020-09-16 NOTE — Telephone Encounter (Signed)
Spoke with pt and informed her she can try for nerve pain capsaicin cream she stated she wants a medication that medicaid will pay for.

## 2020-09-16 NOTE — Telephone Encounter (Signed)
Ok this sounds good.

## 2020-09-16 NOTE — Telephone Encounter (Signed)
Patient returned call to Heather. 

## 2020-09-16 NOTE — Telephone Encounter (Signed)
Rx for lidocaine ointment will be sent, but she needs to be aware that her insurance may not cover the cost.  Her best option is OTC lidocaine ointment or capsaicin cream.

## 2020-09-16 NOTE — Telephone Encounter (Signed)
For the nerve pain, she can try capsaicin cream

## 2020-09-17 ENCOUNTER — Other Ambulatory Visit: Payer: Self-pay | Admitting: Nurse Practitioner

## 2020-09-17 DIAGNOSIS — D72829 Elevated white blood cell count, unspecified: Secondary | ICD-10-CM

## 2020-09-17 DIAGNOSIS — R7989 Other specified abnormal findings of blood chemistry: Secondary | ICD-10-CM

## 2020-09-17 NOTE — Progress Notes (Signed)
   [facility] [address] [place], [place]  [address] Phone:  [phone]   Fax:  [phone] 

## 2020-09-20 ENCOUNTER — Other Ambulatory Visit: Payer: Self-pay

## 2020-09-20 ENCOUNTER — Other Ambulatory Visit: Payer: Medicaid Other

## 2020-09-20 ENCOUNTER — Ambulatory Visit: Payer: Medicaid Other | Admitting: Podiatry

## 2020-09-20 DIAGNOSIS — M722 Plantar fascial fibromatosis: Secondary | ICD-10-CM | POA: Diagnosis not present

## 2020-09-20 DIAGNOSIS — I5032 Chronic diastolic (congestive) heart failure: Secondary | ICD-10-CM

## 2020-09-20 DIAGNOSIS — R7989 Other specified abnormal findings of blood chemistry: Secondary | ICD-10-CM

## 2020-09-20 DIAGNOSIS — D72829 Elevated white blood cell count, unspecified: Secondary | ICD-10-CM

## 2020-09-20 DIAGNOSIS — M7661 Achilles tendinitis, right leg: Secondary | ICD-10-CM | POA: Diagnosis not present

## 2020-09-21 ENCOUNTER — Other Ambulatory Visit: Payer: Self-pay

## 2020-09-21 ENCOUNTER — Other Ambulatory Visit: Payer: Self-pay | Admitting: Nurse Practitioner

## 2020-09-21 LAB — CBC WITH DIFFERENTIAL/PLATELET
Basophils Absolute: 0.1 10*3/uL (ref 0.0–0.2)
Basos: 1 %
EOS (ABSOLUTE): 0.3 10*3/uL (ref 0.0–0.4)
Eos: 3 %
Hematocrit: 29.7 % — ABNORMAL LOW (ref 34.0–46.6)
Hemoglobin: 8.2 g/dL — ABNORMAL LOW (ref 11.1–15.9)
Immature Grans (Abs): 0 10*3/uL (ref 0.0–0.1)
Immature Granulocytes: 0 %
Lymphocytes Absolute: 2.4 10*3/uL (ref 0.7–3.1)
Lymphs: 24 %
MCH: 20.5 pg — ABNORMAL LOW (ref 26.6–33.0)
MCHC: 27.6 g/dL — ABNORMAL LOW (ref 31.5–35.7)
MCV: 74 fL — ABNORMAL LOW (ref 79–97)
Monocytes Absolute: 0.6 10*3/uL (ref 0.1–0.9)
Monocytes: 6 %
Neutrophils Absolute: 6.4 10*3/uL (ref 1.4–7.0)
Neutrophils: 66 %
Platelets: 338 10*3/uL (ref 150–450)
RBC: 4 x10E6/uL (ref 3.77–5.28)
RDW: 22.3 % — ABNORMAL HIGH (ref 11.7–15.4)
WBC: 9.7 10*3/uL (ref 3.4–10.8)

## 2020-09-21 LAB — BASIC METABOLIC PANEL: CO2: 18 mmol/L — ABNORMAL LOW (ref 20–29)

## 2020-09-21 LAB — COMP. METABOLIC PANEL (12)
AST: 32 IU/L (ref 0–40)
Albumin/Globulin Ratio: 1.4 (ref 1.2–2.2)
Albumin: 3.7 g/dL — ABNORMAL LOW (ref 3.8–4.8)
Alkaline Phosphatase: 104 IU/L (ref 44–121)
BUN/Creatinine Ratio: 13 (ref 9–23)
BUN: 16 mg/dL (ref 6–24)
Bilirubin Total: 0.2 mg/dL (ref 0.0–1.2)
Calcium: 9.2 mg/dL (ref 8.7–10.2)
Chloride: 103 mmol/L (ref 96–106)
Creatinine, Ser: 1.25 mg/dL — ABNORMAL HIGH (ref 0.57–1.00)
GFR calc Af Amer: 61 mL/min/{1.73_m2} (ref >59)
GFR calc non Af Amer: 53 mL/min/{1.73_m2} — ABNORMAL LOW (ref >59)
Globulin, Total: 2.6 g/dL (ref 1.5–4.5)
Glucose: 356 mg/dL — ABNORMAL HIGH (ref 65–99)
Potassium: 4.7 mmol/L (ref 3.5–5.2)
Sodium: 137 mmol/L (ref 134–144)
Total Protein: 6.3 g/dL (ref 6.0–8.5)

## 2020-09-21 LAB — BRAIN NATRIURETIC PEPTIDE: BNP: 95.8 pg/mL (ref 0.0–100.0)

## 2020-09-21 MED ORDER — TIZANIDINE HCL 4 MG PO TABS: 6.0000 mg | ORAL_TABLET | Freq: Three times a day (TID) | ORAL | 3 refills | Status: DC | PRN

## 2020-09-21 MED FILL — HYDROCORTISONE 2.5% CREAM: 2.5 | 15 days supply | Qty: 30 | Fill #0

## 2020-09-21 NOTE — Progress Notes (Signed)
   Subjective: 44 y.o. female presenting today for evaluation of bilateral lower extremity pain.  Patient states that she continues to get lower extremity pain to the bilateral feet.  She recently had an MRI performed right lower extremity.  She presents today to review MRI results and discuss further treatment options.  Past Medical History:  Diagnosis Date  . Anemia   . Arthritis    knees, hands  . Asthma   . Chronic diastolic (congestive) heart failure (HCC)   . COPD (chronic obstructive pulmonary disease) (HCC)   . Diabetes mellitus without complication (HCC)    type 2  . Dysfunctional uterine bleeding   . GERD (gastroesophageal reflux disease)   . Hypertension   . Neuromuscular disorder (HCC)    neuropathy feet  . Seizures (HCC) 09/12/2017   pt states r/t stress and blood sugar - no meds last one 4 months ago, not seen neurologist  . Sickle cell trait (HCC)   . Smoker   . Vitamin D deficiency 10/2019  . Wears glasses      Objective: Physical Exam General: The patient is alert and oriented x3 in no acute distress.  Dermatology: Skin is warm, dry and supple bilateral lower extremities. Negative for open lesions or macerations bilateral.   Vascular: Dorsalis Pedis and Posterior Tibial pulses palpable bilateral.  Capillary fill time is immediate to all digits.  Neurological: Epicritic and protective threshold intact bilateral.   Musculoskeletal: Tenderness to palpation to the plantar aspect of the right heel along the plantar fascia.  There is also tenderness to palpation along the insertion of the Achilles tendon right lower extremity.  All other joints range of motion within normal limits bilateral. Strength 5/5 in all groups bilateral.   MRI impression 08/01/2017 RT ankle: 1. 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 2. Generalized soft tissue edema about the  ankle and included foot. 3. No tendinopathy or acute ligamentous pathology crossing the ankle joint. 4. Small plantar calcaneal enthesophyte.  MRI impression 11/30/201 RT ankle:  Negative for tendon or ligament tear.  No acute abnormality. Plantar calcaneal spur without evidence of plantar fasciitis.   Assessment: 1. Plantar fasciitis right - chronic 2.  Achilles tendinitis right -chronic  Plan of Care:  1. Patient evaluated.    2. Patient would like a second opinion regarding the chronic foot and ankle pain. Requesting second opinion at [facility]. Referral placed.   3. RTC PRN   Felecia Shelling, DPM [facility]  Dr. Felecia Shelling, DPM    [address] [place], [place] [address]                Office [phone]  Fax [phone]

## 2020-09-21 NOTE — Progress Notes (Signed)
Received call from [facility] stating that script for tizanidine MUST be 2mg  HCL tablets and can only be prescribed/filled in 30 day supply, script updated and sent to [facility] on file

## 2020-09-22 ENCOUNTER — Ambulatory Visit: Payer: Self-pay | Admitting: Cardiology

## 2020-09-22 ENCOUNTER — Other Ambulatory Visit: Payer: Self-pay | Admitting: Nurse Practitioner

## 2020-09-22 MED ORDER — HYDROCORTISONE 2.5 % EX CREA: TOPICAL_CREAM | Freq: Two times a day (BID) | CUTANEOUS | 11 refills | Status: DC

## 2020-09-23 ENCOUNTER — Other Ambulatory Visit: Payer: Self-pay | Admitting: Nurse Practitioner

## 2020-09-23 NOTE — Telephone Encounter (Signed)
Please see refill request.

## 2020-09-27 ENCOUNTER — Other Ambulatory Visit: Payer: Self-pay

## 2020-09-27 ENCOUNTER — Encounter: Payer: Self-pay | Admitting: Pulmonary Disease

## 2020-09-27 ENCOUNTER — Ambulatory Visit (INDEPENDENT_AMBULATORY_CARE_PROVIDER_SITE_OTHER): Payer: Medicaid Other | Admitting: Pulmonary Disease

## 2020-09-27 VITALS — BP 144/72 | HR 114 | Temp 97.2°F | Ht 64.0 in | Wt 270.1 lb

## 2020-09-27 DIAGNOSIS — Z6841 Body Mass Index (BMI) 40.0 and over, adult: Secondary | ICD-10-CM

## 2020-09-27 DIAGNOSIS — J42 Unspecified chronic bronchitis: Secondary | ICD-10-CM | POA: Diagnosis not present

## 2020-09-27 DIAGNOSIS — I5032 Chronic diastolic (congestive) heart failure: Secondary | ICD-10-CM | POA: Diagnosis not present

## 2020-09-27 DIAGNOSIS — Z87891 Personal history of nicotine dependence: Secondary | ICD-10-CM

## 2020-09-27 DIAGNOSIS — R0683 Snoring: Secondary | ICD-10-CM

## 2020-09-27 DIAGNOSIS — J449 Chronic obstructive pulmonary disease, unspecified: Secondary | ICD-10-CM | POA: Diagnosis not present

## 2020-09-27 MED ORDER — ALBUTEROL SULFATE (2.5 MG/3ML) 0.083% IN NEBU: 2.5000 mg | INHALATION_SOLUTION | Freq: Four times a day (QID) | RESPIRATORY_TRACT | 6 refills | Status: DC | PRN

## 2020-09-27 MED ORDER — SPIRIVA RESPIMAT 2.5 MCG/ACT IN AERS: 2.0000 | INHALATION_SPRAY | Freq: Every day | RESPIRATORY_TRACT | 6 refills | Status: DC

## 2020-09-27 MED ORDER — BUDESONIDE-FORMOTEROL FUMARATE 160-4.5 MCG/ACT IN AERO: INHALATION_SPRAY | RESPIRATORY_TRACT | 6 refills | Status: DC

## 2020-09-27 MED ORDER — ALBUTEROL SULFATE HFA 108 (90 BASE) MCG/ACT IN AERS: 2.0000 | INHALATION_SPRAY | Freq: Four times a day (QID) | RESPIRATORY_TRACT | 6 refills | Status: DC | PRN

## 2020-09-27 NOTE — Progress Notes (Signed)
Synopsis: Referred in August 2020 for COPD by Vevelyn Francois, NP  Subjective:   PATIENT ID: Jill Shaw GENDER: female DOB: 03/13/1976, MRN: [id number]  Chief Complaint  Patient presents with  . Follow-up    Pt stated that she is still getting SHOB with exertion and going up steps.  She stated that this is some worse since her last visit    44 year old female with a past medical history of asthma, COPD, no PFTs on file, gastroesophageal reflux disease, hypertension patient was seen in the emergency department in May 2020 for accidental drug overdose.  Multiple prior lab draws with positive alcohol intoxication. She is here today for evaluation of SOB. She states that it really all started bout 10-15 years ago. She was on lisinopril in the past and it was stopped because she was told it could cause her cough or SOB. She has had cough for a long time. She has been given several different medications and nothing has worked. Current smoker for the past 8-9 years, currently down to 6-7 cigarettes per day, down from 10 per day. 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 She did have dust exposure in the warehouse. She has DMII, GERD.  She has very little exercise if any at all during the week.  OV 09/27/2020: Patient here today for follow-up.  Overall doing well from a respiratory standpoint.  She quit smoking 2 weeks ago.  She is very happy and proud of this.  She is trying to become more active.  She is trying to exercise more but has been limited by knee pain.  She does have an MRI of the knee scheduled soon.  Currently stable with her inhaler regimen.  She does need refills of her medications today.     Past Medical History:  Diagnosis Date  . Anemia   . Arthritis    knees, hands  . Asthma   . Chronic diastolic (congestive) heart failure ([place])   . COPD (chronic obstructive pulmonary disease) ([place])   . Diabetes mellitus without complication ([place])    type 2  .  Dysfunctional uterine bleeding   . GERD (gastroesophageal reflux disease)   . Hypertension   . Neuromuscular disorder (H[facility])    neuropathy feet  . Seizures ([place]) 09/12/2017   pt states r/t stress and blood sugar - no meds last one 4 months ago, not seen neurologist  . Sickle cell trait ([place])   . Smoker   . Vitamin D deficiency 10/2019  . Wears glasses      Family History  Problem Relation Age of Onset  . Diabetes Mother   . Hypertension Mother      Past Surgical History:  Procedure Laterality Date  . CESAREAN SECTION     x 1. for twins  . DILATION AND CURETTAGE OF UTERUS N/A 08/20/2019   Procedure: DILATATION AND CURETTAGE;  Surgeon: Emily Filbert, MD;  Location: [facility];  Service: Gynecology;  Laterality: N/A;  . ENDOMETRIAL ABLATION N/A 08/20/2019   Procedure: [place] Ablation;  Surgeon: Emily Filbert, MD;  Location: [facility];  Service: Gynecology;  Laterality: N/A;  . EYE SURGERY Bilateral    laser right and cataract removed left eye  . RADIOLOGY WITH ANESTHESIA N/A 09/16/2019   Procedure: MRI WITH ANESTHESIA   L SPINE WITHOUT CONTRAST, T SPINE WITHOUT CONTRAST , CERVICAL WITHOUT CONTRAST;  Surgeon: Radiologist, Medication, MD;  Location: [facility];  Service: Radiology;  Laterality: N/A;  . TUBAL LIGATION  interval BTL  . UPPER GI ENDOSCOPY  07/2017    Social History   Socioeconomic History  . Marital status: Legally Separated    Spouse name: Not on file  . Number of children: 3  . Years of education: Not on file  . Highest education level: Not on file  Occupational History  . Occupation: unemployed  Tobacco Use  . Smoking status: Former Smoker    Packs/day: 0.25    Years: 26.00    Pack years: 6.50    Types: Cigarettes    Quit date: 09/13/2020    Years since quitting: 0.0  . Smokeless tobacco: Never Used  . Tobacco comment: 4-5 cigarettes/day  Vaping Use  . Vaping Use: Never used  Substance and Sexual Activity  . Alcohol use: No  . Drug use: No  . Sexual activity:  Not Currently    Birth control/protection: None  Other Topics Concern  . Not on file  Social History Narrative   Right Handed   Lives in a one story apartment, but lives on the second floor   Drinks caffeine once in awhile   Social Determinants of Health   Financial Resource Strain: Not on file  Food Insecurity: No Food Insecurity  . Worried About Charity fundraiser in the Last Year: Never true  . Ran Out of Food in the Last Year: Never true  Transportation Needs: No Transportation Needs  . Lack of Transportation (Medical): No  . Lack of Transportation (Non-Medical): No  Physical Activity: Not on file  Stress: Not on file  Social Connections: Not on file  Intimate Partner Violence: Not on file     Allergies  Allergen Reactions  . Ketoprofen Nausea And Vomiting  . Aspirin Nausea Only  . Gabapentin Nausea And Vomiting and Other (See Comments)    upset stomach  . Ibuprofen Nausea And Vomiting  . Liraglutide Nausea And Vomiting  . Naproxen Nausea And Vomiting  . Omeprazole-Sodium Bicarbonate Nausea And Vomiting  . Sulfa Antibiotics Nausea And Vomiting  . Tramadol Nausea And Vomiting and Other (See Comments)    stomach upset     Outpatient Medications Prior to Visit  Medication Sig Dispense Refill  . albuterol (PROVENTIL) (2.5 MG/3ML) 0.083% nebulizer solution Take 3 mLs (2.5 mg total) by nebulization every 6 (six) hours as needed for wheezing or shortness of breath. 150 mL 3  . albuterol (VENTOLIN HFA) 108 (90 Base) MCG/ACT inhaler Inhale 2 puffs into the lungs every 6 (six) hours as needed for wheezing or shortness of breath. 8 g 3  . blood glucose meter kit and supplies KIT 1 each by Other route See admin instructions. Dispense based on patient and insurance preference. Use up to four times daily as directed. (FOR ICD-9 250.00, 250.01). 1 each 2  . Blood Glucose Monitoring Suppl (TRUE METRIX METER) w/Device KIT 1 each by Does not apply route 4 (four) times daily -  before  meals and at bedtime. 1 kit 0  . budesonide-formoterol (SYMBICORT) 160-4.5 MCG/ACT inhaler INHALE 2 PUFFS INTO THE LUNGS 2 (TWO) TIMES DAILY. (Patient taking differently: Inhale 2 puffs into the lungs 2 (two) times daily.) 10.2 g 3  . buPROPion (WELLBUTRIN SR) 150 MG 12 hr tablet Take 1 tablet (150 mg total) by mouth 2 (two) times daily. 60 tablet 11  . cetirizine (ZYRTEC) 10 MG tablet Take 1 tablet (10 mg total) by mouth daily. 30 tablet 3  . Cholecalciferol (VITAMIN D-3) 125 MCG (5000 UT) TABS Take 1  tablet by mouth daily. (Patient taking differently: Take 5,000 Units by mouth daily.) 90 tablet 3  . Continuous Blood Gluc Receiver (FREESTYLE LIBRE 14 DAY READER) DEVI 1 application by Does not apply route every 14 (fourteen) days. 1 each 0  . cyclobenzaprine (FLEXERIL) 10 MG tablet Take 10 mg by mouth 3 (three) times daily.    . diclofenac (VOLTAREN) 75 MG EC tablet Take 75 mg by mouth 2 (two) times daily as needed.    . diclofenac Sodium (VOLTAREN) 1 % GEL SMARTSIG:4 Gram(s) Topical 4 Times Daily PRN    . ferrous sulfate 325 (65 FE) MG tablet Take 1 tablet (325 mg total) by mouth 3 (three) times daily with meals. (Patient taking differently: Take 325 mg by mouth 2 (two) times daily with a meal.) 30 tablet 3  . fluticasone (FLONASE) 50 MCG/ACT nasal spray Place 2 sprays into both nostrils daily. 16 g 6  . furosemide (LASIX) 40 MG tablet Take 1 tablet (40 mg total) by mouth 2 (two) times daily. 60 tablet 5  . Glucosamine Sulfate 1000 MG CAPS Take 1 capsule (1,000 mg total) by mouth 2 (two) times daily. 180 capsule 3  . glucose blood (TRUE METRIX BLOOD GLUCOSE TEST) test strip Use as instructed 400 each 12  . hydrocortisone 2.5 % cream Apply topically 2 (two) times daily. 30 g 11  . HYDROmorphone (DILAUDID) 2 MG tablet Take 1-2 tablets (2-4 mg total) by mouth every 4 (four) hours as needed for severe pain. 30 tablet 0  . hydroquinone 4 % cream APPLY TOPICALLY TWICE DAILY 28.35 g 0  . Insulin Lispro  Prot & Lispro (HUMALOG MIX 75/25 KWIKPEN) (75-25) 100 UNIT/ML Kwikpen Inject 120 Units into the skin 2 (two) times daily. INJECT 110 UNITS EVERY 12 HOURS 72 mL 11  . Insulin Pen Needle (PEN NEEDLES) 30G X 5 MM MISC 1 Units by Does not apply route as directed. 100 each 11  . lidocaine (XYLOCAINE) 5 % ointment Apply 1 application topically as needed. 35.44 g 0  . losartan (COZAAR) 50 MG tablet Take 1 tablet (50 mg total) by mouth daily. 30 tablet 2  . meloxicam (MOBIC) 7.5 MG tablet Take 1 tablet (7.5 mg total) by mouth daily. 30 tablet 1  . metoCLOPramide (REGLAN) 10 MG tablet Take 1 tablet (10 mg total) by mouth 4 (four) times daily -  before meals and at bedtime. 120 tablet 1  . metoprolol succinate (TOPROL-XL) 100 MG 24 hr tablet Take 1 tablet (100 mg total) by mouth daily. Take with or immediately following a meal. 90 tablet 3  . nicotine (NICODERM CQ) 21 mg/24hr patch Place 1 patch (21 mg total) onto the skin daily. 28 patch 3  . nicotine polacrilex (COMMIT) 4 MG lozenge Take 1 lozenge (4 mg total) by mouth as needed for smoking cessation. 108 tablet 3  . norethindrone (AYGESTIN) 5 MG tablet Take 2 tablets (10 mg total) by mouth in the morning, at noon, in the evening, and at bedtime. With bleeding 120 tablet 0  . omeprazole (PRILOSEC) 40 MG capsule TAKE 1 CAPSULE(40MG TOTAL) BY MOUTH EVERY DAY 30 capsule 2  . promethazine (PHENERGAN) 12.5 MG tablet Take 1 tablet (12.5 mg total) by mouth every 6 (six) hours as needed for nausea or vomiting. 15 tablet 0  . rosuvastatin (CRESTOR) 5 MG tablet Take 1 tablet (5 mg total) by mouth daily. 90 tablet 3  . saxagliptin HCl (ONGLYZA) 2.5 MG TABS tablet Take 1 tablet (2.5 mg total)  by mouth daily. 90 tablet 3  . spironolactone (ALDACTONE) 50 MG tablet Take 1 tablet (50 mg total) by mouth daily. 30 tablet 3  . sucralfate (CARAFATE) 1 GM/10ML suspension Take 10 mLs (1 g total) by mouth 4 (four) times daily -  with meals and at bedtime. 420 mL 0  . Tiotropium  Bromide Monohydrate (SPIRIVA RESPIMAT) 2.5 MCG/ACT AERS Inhale 2 puffs into the lungs daily. 4 g 0  . tiZANidine (ZANAFLEX) 4 MG tablet Take 1.5 tablets (6 mg total) by mouth 3 (three) times daily as needed for muscle spasms. 45 tablet 3  . TRUEplus Lancets 28G MISC 1 each by Other route in the morning, at noon, in the evening, and at bedtime.    . Turmeric 500 MG CAPS Take 500 mg by mouth 2 (two) times daily. 180 capsule 3  . Vitamin D, Ergocalciferol, (DRISDOL) 1.25 MG (50000 UNIT) CAPS capsule Take 1 capsule (50,000 Units total) by mouth every 7 (seven) days. (Patient taking differently: Take 50,000 Units by mouth every Friday.) 5 capsule 6   No facility-administered medications prior to visit.    Review of Systems  Constitutional: Negative for chills, fever, malaise/fatigue and weight loss.  HENT: Negative for hearing loss, sore throat and tinnitus.   Eyes: Negative for blurred vision and double vision.  Respiratory: Positive for shortness of breath. Negative for cough, hemoptysis, sputum production, wheezing and stridor.   Cardiovascular: Negative for chest pain, palpitations, orthopnea, leg swelling and PND.  Gastrointestinal: Negative for abdominal pain, constipation, diarrhea, heartburn, nausea and vomiting.  Genitourinary: Negative for dysuria, hematuria and urgency.  Musculoskeletal: Negative for joint pain and myalgias.  Skin: Negative for itching and rash.  Neurological: Negative for dizziness, tingling, weakness and headaches.  Endo/Heme/Allergies: Negative for environmental allergies. Does not bruise/bleed easily.  Psychiatric/Behavioral: Negative for depression. The patient is not nervous/anxious and does not have insomnia.   All other systems reviewed and are negative.    Objective:  Physical Exam Vitals reviewed.  Constitutional:      General: She is not in acute distress.    Appearance: She is well-developed and well-nourished. She is obese.  HENT:     Head:  Normocephalic and atraumatic.     Mouth/Throat:     Mouth: Oropharynx is clear and moist.  Eyes:     General: No scleral icterus.    Conjunctiva/sclera: Conjunctivae normal.     Pupils: Pupils are equal, round, and reactive to light.  Neck:     Vascular: No JVD.     Trachea: No tracheal deviation.  Cardiovascular:     Rate and Rhythm: Normal rate and regular rhythm.     Pulses: Intact distal pulses.     Heart sounds: Normal heart sounds. No murmur heard.   Pulmonary:     Effort: Pulmonary effort is normal. No tachypnea, accessory muscle usage or respiratory distress.     Breath sounds: Normal breath sounds. No stridor. No wheezing, rhonchi or rales.  Abdominal:     General: Bowel sounds are normal. There is no distension.     Palpations: Abdomen is soft.     Tenderness: There is no abdominal tenderness.  Musculoskeletal:        General: No tenderness or edema.     Cervical back: Neck supple.  Lymphadenopathy:     Cervical: No cervical adenopathy.  Skin:    General: Skin is warm and dry.     Capillary Refill: Capillary refill takes less than 2 seconds.  Findings: No rash.  Neurological:     Mental Status: She is alert and oriented to person, place, and time.  Psychiatric:        Mood and Affect: Mood and affect normal.        Behavior: Behavior normal.      Vitals:   09/27/20 1644  BP: (!) 144/72  Pulse: (!) 114  Temp: (!) 97.2 F (36.2 C)  TempSrc: Tympanic  SpO2: 98%  Weight: 270 lb 2 oz (122.5 kg)  Height: 5' 4" (1.626 m)   98% on RA BMI Readings from Last 3 Encounters:  09/27/20 46.37 kg/m  09/02/20 47.17 kg/m  09/01/20 42.74 kg/m   Wt Readings from Last 3 Encounters:  09/27/20 270 lb 2 oz (122.5 kg)  09/02/20 274 lb 12.8 oz (124.6 kg)  09/01/20 249 lb (112.9 kg)     CBC    Component Value Date/Time   WBC 9.7 09/20/2020 1220   WBC 13.4 (H) 07/12/2020 1649   RBC 4.00 09/20/2020 1220   RBC 4.46 07/12/2020 1649   HGB 8.2 (L) 09/20/2020  1220   HCT 29.7 (L) 09/20/2020 1220   PLT 338 09/20/2020 1220   MCV 74 (L) 09/20/2020 1220   MCH 20.5 (L) 09/20/2020 1220   MCH 20.6 (L) 07/04/2020 1209   MCHC 27.6 (L) 09/20/2020 1220   MCHC 30.2 07/12/2020 1649   RDW 22.3 (H) 09/20/2020 1220   LYMPHSABS 2.4 09/20/2020 1220   MONOABS 0.8 07/12/2020 1649   EOSABS 0.3 09/20/2020 1220   BASOSABS 0.1 09/20/2020 1220    Chest Imaging: 02/07/2019 chest x-ray: No acute cardiopulmonary process. The patient's images have been independently reviewed by me.    Pulmonary Functions Testing Results: PFT Results Latest Ref Rng & Units 10/07/2019  FVC-Pre L 2.31  FVC-Predicted Pre % 72  FVC-Post L 2.41  FVC-Predicted Post % 76  Pre FEV1/FVC % % 77  Post FEV1/FCV % % 66  FEV1-Pre L 1.78  FEV1-Predicted Pre % 68  FEV1-Post L 1.58  DLCO uncorrected ml/min/mmHg 19.24  DLCO UNC% % 85  DLVA Predicted % 121  TLC L 4.35  TLC % Predicted % 83  RV % Predicted % 109    FeNO: None  Pathology: None  Echocardiogram: None  Heart Catheterization: None    Assessment & Plan:     ICD-10-CM   1. Snoring  R06.83   2. Chronic bronchitis, unspecified chronic bronchitis type (HCC)  J42 albuterol (VENTOLIN HFA) 108 (90 Base) MCG/ACT inhaler  3. Chronic obstructive pulmonary disease, unspecified COPD type (HCC)  J44.9 budesonide-formoterol (SYMBICORT) 160-4.5 MCG/ACT inhaler  4. Former smoker  Z87.891   5. Chronic diastolic CHF (congestive heart failure) (HCC)  I50.32   6. BMI 45.0-49.9, adult [facility])  [phone]     Discussion:  44 year old female, multifactorial reason for dyspnea, COPD, chronic diastolic heart failure, obesity, BMI 46.  Currently managed with triple therapy inhaler regimen.  And her chronic heart diastolic heart failure managed by cardiology.  Thankfully she has quit smoking now as of the past 2 weeks.  Plan: Continue smoking cessation, patient was congratulated on this. Continue heart failure regimen and diuretics per  cardiology. Encouraged continued weight loss and exercise as much as tolerated. Refills today of her inhaler regimen.  Return to clinic in 6 months or as needed.   Current Outpatient Medications:  .  albuterol (PROVENTIL) (2.5 MG/3ML) 0.083% nebulizer solution, Take 3 mLs (2.5 mg total) by nebulization every 6 (six) hours as needed for  wheezing or shortness of breath., Disp: 150 mL, Rfl: 3 .  albuterol (VENTOLIN HFA) 108 (90 Base) MCG/ACT inhaler, Inhale 2 puffs into the lungs every 6 (six) hours as needed for wheezing or shortness of breath., Disp: 8 g, Rfl: 3 .  blood glucose meter kit and supplies KIT, 1 each by Other route See admin instructions. Dispense based on patient and insurance preference. Use up to four times daily as directed. (FOR ICD-9 250.00, 250.01)., Disp: 1 each, Rfl: 2 .  Blood Glucose Monitoring Suppl (TRUE METRIX METER) w/Device KIT, 1 each by Does not apply route 4 (four) times daily -  before meals and at bedtime., Disp: 1 kit, Rfl: 0 .  budesonide-formoterol (SYMBICORT) 160-4.5 MCG/ACT inhaler, INHALE 2 PUFFS INTO THE LUNGS 2 (TWO) TIMES DAILY. (Patient taking differently: Inhale 2 puffs into the lungs 2 (two) times daily.), Disp: 10.2 g, Rfl: 3 .  buPROPion (WELLBUTRIN SR) 150 MG 12 hr tablet, Take 1 tablet (150 mg total) by mouth 2 (two) times daily., Disp: 60 tablet, Rfl: 11 .  cetirizine (ZYRTEC) 10 MG tablet, Take 1 tablet (10 mg total) by mouth daily., Disp: 30 tablet, Rfl: 3 .  Cholecalciferol (VITAMIN D-3) 125 MCG (5000 UT) TABS, Take 1 tablet by mouth daily. (Patient taking differently: Take 5,000 Units by mouth daily.), Disp: 90 tablet, Rfl: 3 .  Continuous Blood Gluc Receiver (FREESTYLE LIBRE 14 DAY READER) DEVI, 1 application by Does not apply route every 14 (fourteen) days., Disp: 1 each, Rfl: 0 .  cyclobenzaprine (FLEXERIL) 10 MG tablet, Take 10 mg by mouth 3 (three) times daily., Disp: , Rfl:  .  diclofenac (VOLTAREN) 75 MG EC tablet, Take 75 mg by mouth 2  (two) times daily as needed., Disp: , Rfl:  .  diclofenac Sodium (VOLTAREN) 1 % GEL, SMARTSIG:4 Gram(s) Topical 4 Times Daily PRN, Disp: , Rfl:  .  ferrous sulfate 325 (65 FE) MG tablet, Take 1 tablet (325 mg total) by mouth 3 (three) times daily with meals. (Patient taking differently: Take 325 mg by mouth 2 (two) times daily with a meal.), Disp: 30 tablet, Rfl: 3 .  fluticasone (FLONASE) 50 MCG/ACT nasal spray, Place 2 sprays into both nostrils daily., Disp: 16 g, Rfl: 6 .  furosemide (LASIX) 40 MG tablet, Take 1 tablet (40 mg total) by mouth 2 (two) times daily., Disp: 60 tablet, Rfl: 5 .  Glucosamine Sulfate 1000 MG CAPS, Take 1 capsule (1,000 mg total) by mouth 2 (two) times daily., Disp: 180 capsule, Rfl: 3 .  glucose blood (TRUE METRIX BLOOD GLUCOSE TEST) test strip, Use as instructed, Disp: 400 each, Rfl: 12 .  hydrocortisone 2.5 % cream, Apply topically 2 (two) times daily., Disp: 30 g, Rfl: 11 .  HYDROmorphone (DILAUDID) 2 MG tablet, Take 1-2 tablets (2-4 mg total) by mouth every 4 (four) hours as needed for severe pain., Disp: 30 tablet, Rfl: 0 .  hydroquinone 4 % cream, APPLY TOPICALLY TWICE DAILY, Disp: 28.35 g, Rfl: 0 .  Insulin Lispro Prot & Lispro (HUMALOG MIX 75/25 KWIKPEN) (75-25) 100 UNIT/ML Kwikpen, Inject 120 Units into the skin 2 (two) times daily. INJECT 110 UNITS EVERY 12 HOURS, Disp: 72 mL, Rfl: 11 .  Insulin Pen Needle (PEN NEEDLES) 30G X 5 MM MISC, 1 Units by Does not apply route as directed., Disp: 100 each, Rfl: 11 .  lidocaine (XYLOCAINE) 5 % ointment, Apply 1 application topically as needed., Disp: 35.44 g, Rfl: 0 .  losartan (COZAAR) 50 MG tablet,  Take 1 tablet (50 mg total) by mouth daily., Disp: 30 tablet, Rfl: 2 .  meloxicam (MOBIC) 7.5 MG tablet, Take 1 tablet (7.5 mg total) by mouth daily., Disp: 30 tablet, Rfl: 1 .  metoCLOPramide (REGLAN) 10 MG tablet, Take 1 tablet (10 mg total) by mouth 4 (four) times daily -  before meals and at bedtime., Disp: 120 tablet,  Rfl: 1 .  metoprolol succinate (TOPROL-XL) 100 MG 24 hr tablet, Take 1 tablet (100 mg total) by mouth daily. Take with or immediately following a meal., Disp: 90 tablet, Rfl: 3 .  nicotine (NICODERM CQ) 21 mg/24hr patch, Place 1 patch (21 mg total) onto the skin daily., Disp: 28 patch, Rfl: 3 .  nicotine polacrilex (COMMIT) 4 MG lozenge, Take 1 lozenge (4 mg total) by mouth as needed for smoking cessation., Disp: 108 tablet, Rfl: 3 .  norethindrone (AYGESTIN) 5 MG tablet, Take 2 tablets (10 mg total) by mouth in the morning, at noon, in the evening, and at bedtime. With bleeding, Disp: 120 tablet, Rfl: 0 .  omeprazole (PRILOSEC) 40 MG capsule, TAKE 1 CAPSULE(40MG TOTAL) BY MOUTH EVERY DAY, Disp: 30 capsule, Rfl: 2 .  promethazine (PHENERGAN) 12.5 MG tablet, Take 1 tablet (12.5 mg total) by mouth every 6 (six) hours as needed for nausea or vomiting., Disp: 15 tablet, Rfl: 0 .  rosuvastatin (CRESTOR) 5 MG tablet, Take 1 tablet (5 mg total) by mouth daily., Disp: 90 tablet, Rfl: 3 .  saxagliptin HCl (ONGLYZA) 2.5 MG TABS tablet, Take 1 tablet (2.5 mg total) by mouth daily., Disp: 90 tablet, Rfl: 3 .  spironolactone (ALDACTONE) 50 MG tablet, Take 1 tablet (50 mg total) by mouth daily., Disp: 30 tablet, Rfl: 3 .  sucralfate (CARAFATE) 1 GM/10ML suspension, Take 10 mLs (1 g total) by mouth 4 (four) times daily -  with meals and at bedtime., Disp: 420 mL, Rfl: 0 .  Tiotropium Bromide Monohydrate (SPIRIVA RESPIMAT) 2.5 MCG/ACT AERS, Inhale 2 puffs into the lungs daily., Disp: 4 g, Rfl: 0 .  tiZANidine (ZANAFLEX) 4 MG tablet, Take 1.5 tablets (6 mg total) by mouth 3 (three) times daily as needed for muscle spasms., Disp: 45 tablet, Rfl: 3 .  TRUEplus Lancets 28G MISC, 1 each by Other route in the morning, at noon, in the evening, and at bedtime., Disp: , Rfl:  .  Turmeric 500 MG CAPS, Take 500 mg by mouth 2 (two) times daily., Disp: 180 capsule, Rfl: 3 .  Vitamin D, Ergocalciferol, (DRISDOL) 1.25 MG (50000  UNIT) CAPS capsule, Take 1 capsule (50,000 Units total) by mouth every 7 (seven) days. (Patient taking differently: Take 50,000 Units by mouth every Friday.), Disp: 5 capsule, Rfl: 6  I spent 32 minutes dedicated to the care of this patient on the date of this encounter to include pre-visit review of records, face-to-face time with the patient discussing conditions above, post visit ordering of testing, clinical documentation with the electronic health record, making appropriate referrals as documented, and communicating necessary findings to members of the patients care team.    Garner Nash, DO [facility] 09/27/2020 5:11 PM

## 2020-09-27 NOTE — Patient Instructions (Addendum)
Thank you for visiting Dr. Tonia Brooms at [facility]. Today we recommend the following:  Meds ordered this encounter  Medications  . albuterol (PROVENTIL) (2.5 MG/3ML) 0.083% nebulizer solution    Sig: Take 3 mLs (2.5 mg total) by nebulization every 6 (six) hours as needed for wheezing or shortness of breath.    Dispense:  150 mL    Refill:  6  . albuterol (VENTOLIN HFA) 108 (90 Base) MCG/ACT inhaler    Sig: Inhale 2 puffs into the lungs every 6 (six) hours as needed for wheezing or shortness of breath.    Dispense:  8 g    Refill:  6  . budesonide-formoterol (SYMBICORT) 160-4.5 MCG/ACT inhaler    Sig: INHALE 2 PUFFS INTO THE LUNGS 2 (TWO) TIMES DAILY.    Dispense:  10.2 g    Refill:  6  . Tiotropium Bromide Monohydrate (SPIRIVA RESPIMAT) 2.5 MCG/ACT AERS    Sig: Inhale 2 puffs into the lungs daily.    Dispense:  4 g    Refill:  6    Order Specific Question:   Lot Number?    Answer:   [id number]    Order Specific Question:   Expiration Date?    Answer:   08/16/2021    Order Specific Question:   Quantity    Answer:   2    Return in about 6 months (around 03/28/2021) for with APP or Dr. Tonia Brooms.  Please schedule with Dr. Sherlyn Lick for next available Sleep Consultation.      Please do your part to reduce the spread of COVID-19.

## 2020-09-28 ENCOUNTER — Ambulatory Visit: Payer: Medicaid Other | Admitting: Physical Medicine and Rehabilitation

## 2020-09-29 ENCOUNTER — Other Ambulatory Visit: Payer: Self-pay | Admitting: Obstetrics and Gynecology

## 2020-09-29 ENCOUNTER — Other Ambulatory Visit: Payer: Self-pay

## 2020-09-29 DIAGNOSIS — R202 Paresthesia of skin: Secondary | ICD-10-CM

## 2020-09-29 DIAGNOSIS — N939 Abnormal uterine and vaginal bleeding, unspecified: Secondary | ICD-10-CM

## 2020-09-29 NOTE — Telephone Encounter (Signed)
Recommend addressing with PCP/GI

## 2020-09-30 ENCOUNTER — Other Ambulatory Visit: Payer: Self-pay | Admitting: Lactation Services

## 2020-09-30 DIAGNOSIS — N939 Abnormal uterine and vaginal bleeding, unspecified: Secondary | ICD-10-CM

## 2020-10-04 ENCOUNTER — Ambulatory Visit (HOSPITAL_COMMUNITY): Admission: RE | Admit: 2020-10-04 | Payer: Medicaid Other | Source: Ambulatory Visit

## 2020-10-04 ENCOUNTER — Telehealth: Payer: Self-pay

## 2020-10-04 NOTE — Telephone Encounter (Signed)
Pharmacy name: [facility] [address] [facility] requested: Symbicort 160 CMM?: yes Key: [id number] Covered alternatives: none listed Tried and failed: on file Decision: sent to plan.  Routing to [facility] for follow-up

## 2020-10-04 NOTE — Telephone Encounter (Signed)
Please ask her to give me a definite answer of yes if she wants the blood transfusion.  The reason would be if she is symptomatic meaning she is having shortness of breath.

## 2020-10-04 NOTE — Telephone Encounter (Addendum)
Spoke with patient about the previous My Chart message, and she said that yes she has been taking her meds and she said that if needed she is okay with receiving a blood transfusion if needed

## 2020-10-05 ENCOUNTER — Telehealth: Payer: Self-pay | Admitting: Radiology

## 2020-10-05 NOTE — Telephone Encounter (Signed)
Lyla Son from [facility] called states that the patient was sched for 10/07/20 for MRI under anesthesia, however she has been rescheduled 11/11/20, per Lyla Son her History and Physical form will expire on 10/07/20, so she will need a new form done. She has been scheduled for 11/07/20 for her Covid test.

## 2020-10-05 NOTE — Telephone Encounter (Signed)
Spoke with the patient she said that she is having chest pains sometimes, and she also gets sob.   She wishes to have a transfusion not wanting her hemoglobin to drop to low

## 2020-10-06 ENCOUNTER — Other Ambulatory Visit: Payer: Self-pay | Admitting: Nurse Practitioner

## 2020-10-06 DIAGNOSIS — D649 Anemia, unspecified: Secondary | ICD-10-CM

## 2020-10-06 NOTE — Telephone Encounter (Signed)
Can you call and have be here at 8 am

## 2020-10-06 NOTE — Telephone Encounter (Signed)
Called lvm for pt return call

## 2020-10-06 NOTE — Telephone Encounter (Signed)
Orders have been placed for labs orders in the am staff aware . Thanks

## 2020-10-07 ENCOUNTER — Ambulatory Visit (HOSPITAL_COMMUNITY): Admission: RE | Admit: 2020-10-07 | Payer: Medicaid Other | Source: Ambulatory Visit

## 2020-10-07 ENCOUNTER — Ambulatory Visit (HOSPITAL_COMMUNITY)
Admission: RE | Admit: 2020-10-07 | Discharge: 2020-10-07 | Disposition: A | Discharge status: Home / Self Care | Payer: Medicaid Other | Source: Ambulatory Visit | Attending: Internal Medicine | Admitting: Internal Medicine

## 2020-10-07 ENCOUNTER — Other Ambulatory Visit: Payer: Self-pay | Admitting: Nurse Practitioner

## 2020-10-07 ENCOUNTER — Ambulatory Visit (HOSPITAL_COMMUNITY): Payer: Medicaid Other

## 2020-10-07 ENCOUNTER — Telehealth: Payer: Self-pay

## 2020-10-07 ENCOUNTER — Other Ambulatory Visit: Payer: Self-pay

## 2020-10-07 DIAGNOSIS — D649 Anemia, unspecified: Secondary | ICD-10-CM | POA: Insufficient documentation

## 2020-10-07 DIAGNOSIS — L309 Dermatitis, unspecified: Secondary | ICD-10-CM

## 2020-10-07 LAB — CBC WITH DIFFERENTIAL/PLATELET
Abs Immature Granulocytes: 0.05 10*3/uL (ref 0.00–0.07)
Basophils Absolute: 0.1 10*3/uL (ref 0.0–0.1)
Basophils Relative: 1 %
Eosinophils Absolute: 0.5 10*3/uL (ref 0.0–0.5)
Eosinophils Relative: 4 %
HCT: 36.8 % (ref 36.0–46.0)
Hemoglobin: 11 g/dL — ABNORMAL LOW (ref 12.0–15.0)
Immature Granulocytes: 0 %
Lymphocytes Relative: 25 %
Lymphs Abs: 3.3 10*3/uL (ref 0.7–4.0)
MCH: 21.1 pg — ABNORMAL LOW (ref 26.0–34.0)
MCHC: 29.9 g/dL — ABNORMAL LOW (ref 30.0–36.0)
MCV: 70.5 fL — ABNORMAL LOW (ref 80.0–100.0)
Monocytes Absolute: 0.9 10*3/uL (ref 0.1–1.0)
Monocytes Relative: 6 %
Neutro Abs: 8.7 10*3/uL — ABNORMAL HIGH (ref 1.7–7.7)
Neutrophils Relative %: 64 %
Platelets: 390 10*3/uL (ref 150–400)
RBC: 5.22 MIL/uL — ABNORMAL HIGH (ref 3.87–5.11)
RDW: 23.7 % — ABNORMAL HIGH (ref 11.5–15.5)
WBC: 13.5 10*3/uL — ABNORMAL HIGH (ref 4.0–10.5)
nRBC: 0 % (ref 0.0–0.2)

## 2020-10-07 LAB — TYPE AND SCREEN
ABO/RH(D): O POS
Antibody Screen: NEGATIVE

## 2020-10-07 MED ORDER — CETIRIZINE HCL 10 MG PO TABS: 10.0000 mg | ORAL_TABLET | Freq: Every day | ORAL | 3 refills | Status: DC

## 2020-10-07 NOTE — Progress Notes (Signed)
Patient came to the day infusion hospital for possible blood transfusion. CBC drawn and Hemoglobin found to be 11.0. Provider, Thad Ranger FNP, notified and spoke with patient. Patient will not receive transfusion today. Patient alert, oriented and ambulatory at discharge.

## 2020-10-07 NOTE — Telephone Encounter (Signed)
Pharmacist called to inform provider that he is concerned re: pt's usage of muscle relaxer medication, reports that pt has been picking up 2mg  capsules and it is covered by insurance even though it was not supposed to be as well as the 4mg  tablets, each script is a 10-day supply, pt has also picked up script for Flexeril that was on file at the pharmacy, I advised pharmacy that pt should only be taking the 4mg  tablet, nothing else, they will remove any remaining RFs

## 2020-10-07 NOTE — Progress Notes (Signed)
   [facility] [address] [place], [place]  [address] Phone:  [phone]   Fax:  [phone] 

## 2020-10-07 NOTE — Telephone Encounter (Signed)
All muscle relaxer's have been discontinued. Will discuss with patient when refill is requested in the future.

## 2020-10-12 DIAGNOSIS — L299 Pruritus, unspecified: Secondary | ICD-10-CM | POA: Diagnosis not present

## 2020-10-12 DIAGNOSIS — L819 Disorder of pigmentation, unspecified: Secondary | ICD-10-CM | POA: Diagnosis not present

## 2020-10-12 NOTE — Telephone Encounter (Signed)
Checked status of PA via [email or web address]:   Outcome N/A on December 20 We received a prior authorization request for the member and product listed above. The [facility] Prior Authorization Team is not able to review this request because the brand name for the requested product is preferred on the formulary and is covered without prior authorization requirements.  Pharmacy has confirmed that brand rx has been dispensed.  Nothing further needed at this time- will close encounter.

## 2020-10-13 ENCOUNTER — Other Ambulatory Visit: Payer: Self-pay

## 2020-10-13 ENCOUNTER — Encounter: Payer: Self-pay | Admitting: Family Medicine

## 2020-10-13 ENCOUNTER — Encounter: Payer: Self-pay | Admitting: Physical Medicine and Rehabilitation

## 2020-10-13 ENCOUNTER — Encounter
Payer: Medicaid Other | Attending: Physical Medicine and Rehabilitation | Admitting: Physical Medicine and Rehabilitation

## 2020-10-13 VITALS — BP 196/82 | HR 120 | Temp 98.8°F | Ht 64.0 in | Wt 268.0 lb

## 2020-10-13 DIAGNOSIS — M797 Fibromyalgia: Secondary | ICD-10-CM | POA: Diagnosis not present

## 2020-10-13 DIAGNOSIS — R1033 Periumbilical pain: Secondary | ICD-10-CM | POA: Diagnosis not present

## 2020-10-13 MED ORDER — AMITRIPTYLINE HCL 75 MG PO TABS: 75.0000 mg | ORAL_TABLET | Freq: Every day | ORAL | 1 refills | Status: DC

## 2020-10-13 NOTE — Progress Notes (Signed)
Subjective:    Patient ID: Jill Shaw, female    DOB: [birth year], 44 y.o.   MRN: [id number]  HPI  Jill Shaw is a 44 year old woman who presents with chronic pain since 2014  She has been diagnosed with arthritis, neuropathy, heel spurs, and plantar fascitis.   Average pain is 10/10, pain right now is 9/10.  She as tried Meloxicam.   She does not like injections. After the last injection she got in her foot she could not move her foot.    Pain Inventory Average Pain 10 Pain Right Now 9 My pain is sharp, burning, dull, stabbing, tingling and aching  In the last 24 hours, has pain interfered with the following? General activity 0 Relation with others 1 Enjoyment of life 4 What TIME of day is your pain at its worst? morning , daytime, evening and night Sleep (in general) Poor  Pain is worse with: walking, bending, sitting, inactivity, standing, unsure and some activites Pain improves with: heat/ice and medication Relief from Meds: n/a  walk without assistance walk with assistance use a cane use a walker ability to climb steps?  yes use a wheelchair needs help with transfers transfers alone Do you have any goals in this area?  yes  not employed: date last employed . I need assistance with the following:  bathing, household duties and shopping  weakness numbness tremor tingling trouble walking spasms dizziness confusion depression anxiety  new  new    Family History  Problem Relation Age of Onset  . Diabetes Mother   . Hypertension Mother    Social History   Socioeconomic History  . Marital status: Legally Separated    Spouse name: Not on file  . Number of children: 3  . Years of education: Not on file  . Highest education level: Not on file  Occupational History  . Occupation: unemployed  Tobacco Use  . Smoking status: Former Smoker    Packs/day: 0.25    Years: 26.00    Pack years: 6.50    Types: Cigarettes    Quit date:  09/13/2020    Years since quitting: 0.0  . Smokeless tobacco: Never Used  . Tobacco comment: 4-5 cigarettes/day  Vaping Use  . Vaping Use: Never used  Substance and Sexual Activity  . Alcohol use: No  . Drug use: No  . Sexual activity: Not Currently    Birth control/protection: None  Other Topics Concern  . Not on file  Social History Narrative   Right Handed   Lives in a one story apartment, but lives on the second floor   Drinks caffeine once in awhile   Social Determinants of Health   Financial Resource Strain: Not on file  Food Insecurity: No Food Insecurity  . Worried About Programme researcher, broadcasting/film/video in the Last Year: Never true  . Ran Out of Food in the Last Year: Never true  Transportation Needs: No Transportation Needs  . Lack of Transportation (Medical): No  . Lack of Transportation (Non-Medical): No  Physical Activity: Not on file  Stress: Not on file  Social Connections: Not on file   Past Surgical History:  Procedure Laterality Date  . CESAREAN SECTION     x 1. for twins  . DILATION AND CURETTAGE OF UTERUS N/A 08/20/2019   Procedure: DILATATION AND CURETTAGE;  Surgeon: Allie Bossier, MD;  Location: [facility] OR;  Service: Gynecology;  Laterality: N/A;  . ENDOMETRIAL ABLATION N/A 08/20/2019   Procedure: Maren Reamer  Ablation;  Surgeon: Allie Bossier, MD;  Location: [facility] OR;  Service: Gynecology;  Laterality: N/A;  . EYE SURGERY Bilateral    laser right and cataract removed left eye  . RADIOLOGY WITH ANESTHESIA N/A 09/16/2019   Procedure: MRI WITH ANESTHESIA   L SPINE WITHOUT CONTRAST, T SPINE WITHOUT CONTRAST , CERVICAL WITHOUT CONTRAST;  Surgeon: Radiologist, Medication, MD;  Location: [facility] OR;  Service: Radiology;  Laterality: N/A;  . TUBAL LIGATION     interval BTL  . UPPER GI ENDOSCOPY  07/2017   Past Medical History:  Diagnosis Date  . Anemia   . Arthritis    knees, hands  . Asthma   . Chronic diastolic (congestive) heart failure (HCC)   . COPD (chronic obstructive pulmonary  disease) (HCC)   . Diabetes mellitus without complication (HCC)    type 2  . Dysfunctional uterine bleeding   . GERD (gastroesophageal reflux disease)   . Hypertension   . Neuromuscular disorder (HCC)    neuropathy feet  . Seizures (HCC) 09/12/2017   pt states r/t stress and blood sugar - no meds last one 4 months ago, not seen neurologist  . Sickle cell trait (HCC)   . Smoker   . Vitamin D deficiency 10/2019  . Wears glasses    BP (!) 196/82   Pulse (!) 120   Temp 98.8 F (37.1 C)   Ht 5\' 4"  (1.626 m)   Wt 268 lb (121.6 kg)   SpO2 98%   BMI 46.00 kg/m   Opioid Risk Score:   Fall Risk Score:  `1  Depression screen PHQ 2/9  Depression screen [facility] 2/9 10/13/2020 09/02/2020 08/11/2020 07/26/2020 05/26/2020 05/14/2020 05/07/2020  Decreased Interest 1 2 2 2 3 3  0  Down, Depressed, Hopeless 0 2 2 2 3 3 1   PHQ - 2 Score 1 4 4 4 6 6 1   Altered sleeping 3 2 2 2 3 3  -  Tired, decreased energy 3 2 0 2 3 2  -  Change in appetite 0 0 0 0 3 2 -  Feeling bad or failure about yourself  0 0 0 0 3 - -  Trouble concentrating 2 0 2 2 2 2  -  Moving slowly or fidgety/restless 0 0 2 0 0 0 -  Suicidal thoughts 0 0 0 0 2 (No Data) -  PHQ-9 Score 9 8 10 10 22 15  -  Difficult doing work/chores Very difficult Somewhat difficult - - - - -  Some recent data might be hidden    Review of Systems  Constitutional: Positive for diaphoresis and unexpected weight change.  HENT: Negative.   Eyes: Negative.   Respiratory: Positive for shortness of breath and wheezing.   Cardiovascular:       Hypertension  Gastrointestinal: Positive for abdominal pain, constipation and nausea.  Endocrine:       High/Low blood sugar  Genitourinary: Negative.   Musculoskeletal: Positive for arthralgias, back pain, gait problem and myalgias.       Spasms  Skin: Negative.   Allergic/Immunologic: Negative.   Neurological: Positive for dizziness, weakness and numbness.       Tingling   Hematological: Bruises/bleeds  easily.  Psychiatric/Behavioral: Positive for confusion and dysphoric mood. The patient is nervous/anxious.   All other systems reviewed and are negative.      Objective:   Physical Exam Gen: no distress, normal appearing HEENT: oral mucosa pink and moist, NCAT Cardio: Reg rate Chest: normal effort, normal rate of breathing Abd: soft,  non-distended Ext: no edema Skin: intact Neuro: sensation intact in upper extremities.  Musculoskeletal: full flexion, limited extension which is worse with oblique extension.  Psych: pleasant, normal affect, loosed train of thought in conversation.      Assessment & Plan:  1) Chronic Pain Syndrome secondary to fibromyalgia.  -Discussed current symptoms of pain and history of pain.  -Discussed benefits of exercise in reducing pain. -Hot bath daily.  -Has tried Lidocaine patches.  -Discussed following foods that may reduce pain: 1) Ginger 2) Blueberries 3) Salmon 4) Pumpkin seeds 5) dark chocolate 6) turmeric 7) tart cherries 8) virgin olive oil 9) chilli peppers 10) mint 11) red wine  2) Diffuse arthritis -Has tried voltaren gel.  3) Insomnia: -She thinks she has tried Amitriptyline without benefit.    4) Abdominal pain:  -She had an ablation due to heavy menstrual bleeding in the past.   5) Neuropathic pain in bilateral feet: -She got sick on Gabapentin. -She got sick with Cymbalta.

## 2020-10-13 NOTE — Telephone Encounter (Signed)
Faxed H&P form to Fran Lowes at [facility]

## 2020-10-13 NOTE — Telephone Encounter (Signed)
I called and she needs to speak with Jill Shaw

## 2020-10-13 NOTE — Patient Instructions (Signed)
-  Discussed following foods that may reduce pain: 1) Ginger 2) Blueberries 3) Salmon 4) Pumpkin seeds 5) dark chocolate 6) turmeric 7) tart cherries 8) virgin olive oil 9) chilli peppers 10) mint 11) red wine .   

## 2020-10-14 ENCOUNTER — Ambulatory Visit: Payer: Medicaid Other | Admitting: Cardiology

## 2020-10-14 ENCOUNTER — Encounter: Payer: Self-pay | Admitting: Cardiology

## 2020-10-14 VITALS — BP 186/96 | HR 110 | Temp 98.7°F | Ht 64.0 in | Wt 263.0 lb

## 2020-10-14 DIAGNOSIS — R072 Precordial pain: Secondary | ICD-10-CM

## 2020-10-14 DIAGNOSIS — R000 Tachycardia, unspecified: Secondary | ICD-10-CM | POA: Insufficient documentation

## 2020-10-14 DIAGNOSIS — I10 Essential (primary) hypertension: Secondary | ICD-10-CM

## 2020-10-14 MED ORDER — METOPROLOL SUCCINATE ER 200 MG PO TB24: 200.0000 mg | ORAL_TABLET | Freq: Every day | ORAL | 2 refills | Status: DC

## 2020-10-14 NOTE — Progress Notes (Signed)
Patient referred by Vevelyn Francois, NP for congestive heart failure  Subjective:   Jill Shaw, female    DOB: Jun 21, 1976, 44 y.o.   MRN: [id number]   Chief Complaint  Patient presents with  . Chronic heart failure with preserved ejection fraction  . Chest Pain     HPI  44 year old African-American female with hypertension, type 2 diabetes mellitus, HFpEF, COPD, tobacco dependence, morbid obesity, microcytic anemia, uterine dysfunction, recurrent syncope  Patient has had constant chest pain for past 3 days. Her blood pressure has been elevated. She does not want to to the ER.  Initial consultation HPI 08/2020: Patient was previously been seen by Dr. Quay Burow at [facility].  Her syncopal episodes were thought to be vasovagal in nature, given absence of any arrhythmias noted on telemetry.  She did have demand ischemia in the past with troponin elevation that was thought to be secondary to heart failure.  Echocardiogram showed normal EF with grade 1 diastolic dysfunction suggesting HFpEF.  Dr. Gwenlyn Found had felt that she did not need disability from cardiac standpoint.  Patient is now here to see me as second opinion.  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  She reports that she gets short of breath with minimal activity, such as walking his steps.  She also reports excellent orthopnea.  Denies any PND.  She has episodes of chest pain lasting for 3 minutes with or without exertion.  She does have occasional leg swelling.  She takes up to 4 pills of Lasix 40 mg every day for her leg swelling.  Unfortunately, he continues to smoke.  She is now down to 3 cigarettes a day with use of nicotine patches.   Current Outpatient Medications on File Prior to Visit  Medication Sig Dispense Refill  . albuterol (PROVENTIL) (2.5 MG/3ML) 0.083% nebulizer solution Take 3 mLs (2.5 mg total) by nebulization every 6 (six) hours as needed for  wheezing or shortness of breath. 150 mL 6  . albuterol (VENTOLIN HFA) 108 (90 Base) MCG/ACT inhaler Inhale 2 puffs into the lungs every 6 (six) hours as needed for wheezing or shortness of breath. 8 g 6  . amitriptyline (ELAVIL) 75 MG tablet Take 1 tablet (75 mg total) by mouth at bedtime. 30 tablet 1  . Blood Glucose Monitoring Suppl (TRUE METRIX METER) w/Device KIT 1 each by Does not apply route 4 (four) times daily -  before meals and at bedtime. 1 kit 0  . budesonide-formoterol (SYMBICORT) 160-4.5 MCG/ACT inhaler INHALE 2 PUFFS INTO THE LUNGS 2 (TWO) TIMES DAILY. 10.2 g 6  . buPROPion (WELLBUTRIN SR) 150 MG 12 hr tablet Take 1 tablet (150 mg total) by mouth 2 (two) times daily. 60 tablet 11  . cetirizine (ZYRTEC) 10 MG tablet Take 1 tablet (10 mg total) by mouth daily. 30 tablet 3  . Cholecalciferol (VITAMIN D-3) 125 MCG (5000 UT) TABS Take 1 tablet by mouth daily. (Patient taking differently: Take 5,000 Units by mouth daily.) 90 tablet 3  . Continuous Blood Gluc Receiver (FREESTYLE LIBRE 14 DAY READER) DEVI 1 application by Does not apply route every 14 (fourteen) days. 1 each 0  . diclofenac (VOLTAREN) 75 MG EC tablet Take 75 mg by mouth 2 (two) times daily as needed.    . diclofenac Sodium (VOLTAREN) 1 % GEL SMARTSIG:4 Gram(s) Topical 4 Times Daily PRN    . ferrous sulfate 325 (65 FE)  MG tablet Take 1 tablet (325 mg total) by mouth 3 (three) times daily with meals. (Patient taking differently: Take 325 mg by mouth 2 (two) times daily with a meal.) 30 tablet 3  . fluticasone (FLONASE) 50 MCG/ACT nasal spray Place 2 sprays into both nostrils daily. 16 g 6  . furosemide (LASIX) 40 MG tablet Take 1 tablet (40 mg total) by mouth 2 (two) times daily. 60 tablet 5  . Glucosamine Sulfate 1000 MG CAPS Take 1 capsule (1,000 mg total) by mouth 2 (two) times daily. 180 capsule 3  . glucose blood (TRUE METRIX BLOOD GLUCOSE TEST) test strip Use as instructed 400 each 12  . hydrocortisone 2.5 % cream Apply  topically 2 (two) times daily. 30 g 11  . HYDROmorphone (DILAUDID) 2 MG tablet Take 1-2 tablets (2-4 mg total) by mouth every 4 (four) hours as needed for severe pain. 30 tablet 0  . Insulin Lispro Prot & Lispro (HUMALOG MIX 75/25 KWIKPEN) (75-25) 100 UNIT/ML Kwikpen Inject 120 Units into the skin 2 (two) times daily. INJECT 110 UNITS EVERY 12 HOURS 72 mL 11  . Insulin Pen Needle (PEN NEEDLES) 30G X 5 MM MISC 1 Units by Does not apply route as directed. 100 each 11  . lidocaine (XYLOCAINE) 5 % ointment Apply 1 application topically as needed. 35.44 g 0  . losartan (COZAAR) 50 MG tablet Take 1 tablet (50 mg total) by mouth daily. 30 tablet 2  . meloxicam (MOBIC) 7.5 MG tablet Take 1 tablet (7.5 mg total) by mouth daily. 30 tablet 1  . metoCLOPramide (REGLAN) 10 MG tablet Take 1 tablet (10 mg total) by mouth 4 (four) times daily -  before meals and at bedtime. 120 tablet 1  . metoprolol succinate (TOPROL-XL) 100 MG 24 hr tablet Take 1 tablet (100 mg total) by mouth daily. Take with or immediately following a meal. 90 tablet 3  . nicotine (NICODERM CQ) 21 mg/24hr patch Place 1 patch (21 mg total) onto the skin daily. 28 patch 3  . nicotine polacrilex (COMMIT) 4 MG lozenge Take 1 lozenge (4 mg total) by mouth as needed for smoking cessation. 108 tablet 3  . norethindrone (AYGESTIN) 5 MG tablet Take 2 tablets (10 mg total) by mouth in the morning, at noon, in the evening, and at bedtime. With bleeding 120 tablet 0  . omeprazole (PRILOSEC) 40 MG capsule TAKE 1 CAPSULE(40MG TOTAL) BY MOUTH EVERY DAY 30 capsule 2  . promethazine (PHENERGAN) 12.5 MG tablet Take 1 tablet (12.5 mg total) by mouth every 6 (six) hours as needed for nausea or vomiting. 15 tablet 0  . rosuvastatin (CRESTOR) 5 MG tablet Take 1 tablet (5 mg total) by mouth daily. 90 tablet 3  . saxagliptin HCl (ONGLYZA) 2.5 MG TABS tablet Take 1 tablet (2.5 mg total) by mouth daily. 90 tablet 3  . spironolactone (ALDACTONE) 50 MG tablet Take 1 tablet  (50 mg total) by mouth daily. 30 tablet 3  . sucralfate (CARAFATE) 1 GM/10ML suspension Take 10 mLs (1 g total) by mouth 4 (four) times daily -  with meals and at bedtime. 420 mL 0  . Tiotropium Bromide Monohydrate (SPIRIVA RESPIMAT) 2.5 MCG/ACT AERS Inhale 2 puffs into the lungs daily. 4 g 6  . TRUEplus Lancets 28G MISC 1 each by Other route in the morning, at noon, in the evening, and at bedtime.    . Turmeric 500 MG CAPS Take 500 mg by mouth 2 (two) times daily. 180 capsule 3  .  Vitamin D, Ergocalciferol, (DRISDOL) 1.25 MG (50000 UNIT) CAPS capsule Take 1 capsule (50,000 Units total) by mouth every 7 (seven) days. (Patient taking differently: Take 50,000 Units by mouth every Friday.) 5 capsule 6  . blood glucose meter kit and supplies KIT 1 each by Other route See admin instructions. Dispense based on patient and insurance preference. Use up to four times daily as directed. (FOR ICD-9 250.00, 250.01). 1 each 2  . hydroquinone 4 % cream APPLY TOPICALLY TWICE DAILY 28.35 g 0  . [DISCONTINUED] hydrALAZINE (APRESOLINE) 50 MG tablet Take 1 tablet (50 mg total) by mouth 3 (three) times daily. 90 tablet 0   No current facility-administered medications on file prior to visit.    Cardiovascular and other pertinent studies:  EKG 10/14/2020: Sinus tachycardia 111 bpm  Cannot exclude old anteroseptal infarct  EKG 09/01/2020: Sinus rhythm 85 bpm  Poor R wave progression Otherwise normal EKG  Vascular US 07/16/2020: No DVT  Echocardiogram 04/30/2020: 1. Left ventricular ejection fraction, by estimation, is 55 to 60%. The  left ventricle has normal function. The left ventricle has no regional  wall motion abnormalities. There is moderate concentric left ventricular  hypertrophy. Left ventricular  diastolic parameters are consistent with Grade I diastolic dysfunction  (impaired relaxation). Elevated left ventricular end-diastolic pressure.  2. Right ventricular systolic function is normal. The  right ventricular  size is normal.  3. The mitral valve is normal in structure. Trivial mitral valve  regurgitation. No evidence of mitral stenosis.  4. The aortic valve is normal in structure. Aortic valve regurgitation is  not visualized. No aortic stenosis is present.  5. The inferior vena cava is dilated in size with <50% respiratory  variability, suggesting right atrial pressure of 15 mmHg.    Recent labs: 10/07/2020: H/H 11/36. MCV 70. Platelets 390 WBC 13k  09/20/2020: Glucose 356, BUN/Cr 16/1.25. EGFR 61. Na/K 137/4.7. Rest of the CMP normal H/H 8/29. MCV 74. Platelets 338 BNP 95  07/26/2020: Glucose 160, BUN/Cr 10/1.13. EGFR 60. Na/K 140/4.0. Rest of the CMP normal H/H 9.5/34.7. MCV 70. Platelets 311 HbA1C 9.2% Chol 135, TG 166, HDL 29, LDL 77   Review of Systems  Cardiovascular: Positive for chest pain, dyspnea on exertion, leg swelling and orthopnea. Negative for palpitations and syncope.         Vitals:   10/14/20 1435 10/14/20 1441  BP: (!) 197/102 (!) 186/96  Pulse: (!) 120 (!) 110  Temp:  98.7 F (37.1 C)  SpO2: 100% 100%     Body mass index is 45.14 kg/m. Filed Weights   10/14/20 1435  Weight: 263 lb (119.3 kg)     Objective:   Physical Exam Vitals and nursing note reviewed.  Constitutional:      General: She is in acute distress.  Neck:     Vascular: No JVD.  Cardiovascular:     Rate and Rhythm: Regular rhythm. Tachycardia present.     Pulses: Normal pulses.     Heart sounds: Normal heart sounds. No murmur heard.   Pulmonary:     Effort: Pulmonary effort is normal.     Breath sounds: Normal breath sounds. No wheezing or rales.  Musculoskeletal:     Right lower leg: No edema.     Left lower leg: No edema.         Assessment & Recommendations:   44 year old African-American female with hypertension, type 2 diabetes mellitus, HFpEF, COPD, tobacco dependence, morbid obesity, microcytic anemia, uterine dysfunction, recurrent  syncope  Precordial pain: No ischemia on EKG. Hypertensive urgency likely cause. Recommended going to ER< patient is reluctant.  Will check Sr and 24 hr urine metanephrine. Increased metoprolol succinate to 200 mg daily.  Continue rest of the antihypertensive therapy.   Chronic heart failure with preserved ejection fraction (HCC) Likely related to hypertension, obesity, controlled type 2 diabetes mellitus.  Other differential for her dyspnea is microcytic anemia.  Defer management to PCP.  From heart failure standpoint, added spironolactone 50 mg daily.  Hopefully, she will not have to use as much Lasix with concurrent use of spironolactone and losartan.  Check BMP and BNP in 1 week.  Recommend maintaining a log of blood pressures and daily weights. Reduce salt intake to <2 g.  Malignant hypertension Uncontrolled. See above.  Controlled type 2 diabetes mellitus without complication, with long-term current use of insulin ([place]) Management as per PCP.  F/u in 2 weeks   Nigel Mormon, MD Pager: [phone] Office: [phone]

## 2020-10-15 ENCOUNTER — Encounter (HOSPITAL_COMMUNITY): Payer: Self-pay

## 2020-10-15 ENCOUNTER — Other Ambulatory Visit: Payer: Self-pay

## 2020-10-15 ENCOUNTER — Inpatient Hospital Stay (HOSPITAL_COMMUNITY)
Admission: EM | Admit: 2020-10-15 | Discharge: 2020-10-17 | DRG: 637 | Disposition: A | Discharge status: Home / Self Care | Payer: Medicaid Other | Attending: Internal Medicine | Admitting: Internal Medicine

## 2020-10-15 ENCOUNTER — Emergency Department (HOSPITAL_COMMUNITY): Payer: Medicaid Other

## 2020-10-15 DIAGNOSIS — Z560 Unemployment, unspecified: Secondary | ICD-10-CM | POA: Diagnosis not present

## 2020-10-15 DIAGNOSIS — Z20822 Contact with and (suspected) exposure to covid-19: Secondary | ICD-10-CM | POA: Diagnosis not present

## 2020-10-15 DIAGNOSIS — E785 Hyperlipidemia, unspecified: Secondary | ICD-10-CM | POA: Diagnosis present

## 2020-10-15 DIAGNOSIS — M170 Bilateral primary osteoarthritis of knee: Secondary | ICD-10-CM | POA: Diagnosis not present

## 2020-10-15 DIAGNOSIS — E1110 Type 2 diabetes mellitus with ketoacidosis without coma: Secondary | ICD-10-CM | POA: Diagnosis not present

## 2020-10-15 DIAGNOSIS — G9341 Metabolic encephalopathy: Secondary | ICD-10-CM

## 2020-10-15 DIAGNOSIS — Z8249 Family history of ischemic heart disease and other diseases of the circulatory system: Secondary | ICD-10-CM

## 2020-10-15 DIAGNOSIS — K219 Gastro-esophageal reflux disease without esophagitis: Secondary | ICD-10-CM | POA: Diagnosis not present

## 2020-10-15 DIAGNOSIS — E1165 Type 2 diabetes mellitus with hyperglycemia: Secondary | ICD-10-CM | POA: Diagnosis not present

## 2020-10-15 DIAGNOSIS — Z794 Long term (current) use of insulin: Secondary | ICD-10-CM | POA: Diagnosis not present

## 2020-10-15 DIAGNOSIS — R6520 Severe sepsis without septic shock: Secondary | ICD-10-CM

## 2020-10-15 DIAGNOSIS — Z9111 Patient's noncompliance with dietary regimen: Secondary | ICD-10-CM

## 2020-10-15 DIAGNOSIS — I130 Hypertensive heart and chronic kidney disease with heart failure and stage 1 through stage 4 chronic kidney disease, or unspecified chronic kidney disease: Secondary | ICD-10-CM | POA: Diagnosis present

## 2020-10-15 DIAGNOSIS — Z791 Long term (current) use of non-steroidal anti-inflammatories (NSAID): Secondary | ICD-10-CM | POA: Diagnosis not present

## 2020-10-15 DIAGNOSIS — E1310 Other specified diabetes mellitus with ketoacidosis without coma: Secondary | ICD-10-CM

## 2020-10-15 DIAGNOSIS — N179 Acute kidney failure, unspecified: Secondary | ICD-10-CM | POA: Diagnosis not present

## 2020-10-15 DIAGNOSIS — D573 Sickle-cell trait: Secondary | ICD-10-CM | POA: Diagnosis not present

## 2020-10-15 DIAGNOSIS — Z833 Family history of diabetes mellitus: Secondary | ICD-10-CM

## 2020-10-15 DIAGNOSIS — R4182 Altered mental status, unspecified: Secondary | ICD-10-CM | POA: Diagnosis not present

## 2020-10-15 DIAGNOSIS — J449 Chronic obstructive pulmonary disease, unspecified: Secondary | ICD-10-CM | POA: Diagnosis present

## 2020-10-15 DIAGNOSIS — R404 Transient alteration of awareness: Secondary | ICD-10-CM | POA: Diagnosis not present

## 2020-10-15 DIAGNOSIS — N1831 Chronic kidney disease, stage 3a: Secondary | ICD-10-CM | POA: Diagnosis present

## 2020-10-15 DIAGNOSIS — D509 Iron deficiency anemia, unspecified: Secondary | ICD-10-CM | POA: Diagnosis present

## 2020-10-15 DIAGNOSIS — R000 Tachycardia, unspecified: Secondary | ICD-10-CM | POA: Diagnosis not present

## 2020-10-15 DIAGNOSIS — E559 Vitamin D deficiency, unspecified: Secondary | ICD-10-CM | POA: Diagnosis present

## 2020-10-15 DIAGNOSIS — E875 Hyperkalemia: Secondary | ICD-10-CM

## 2020-10-15 DIAGNOSIS — Z888 Allergy status to other drugs, medicaments and biological substances status: Secondary | ICD-10-CM

## 2020-10-15 DIAGNOSIS — E1122 Type 2 diabetes mellitus with diabetic chronic kidney disease: Secondary | ICD-10-CM | POA: Diagnosis present

## 2020-10-15 DIAGNOSIS — I5032 Chronic diastolic (congestive) heart failure: Secondary | ICD-10-CM | POA: Diagnosis present

## 2020-10-15 DIAGNOSIS — A419 Sepsis, unspecified organism: Secondary | ICD-10-CM

## 2020-10-15 DIAGNOSIS — Z79899 Other long term (current) drug therapy: Secondary | ICD-10-CM

## 2020-10-15 DIAGNOSIS — Z87891 Personal history of nicotine dependence: Secondary | ICD-10-CM

## 2020-10-15 DIAGNOSIS — I959 Hypotension, unspecified: Secondary | ICD-10-CM | POA: Diagnosis not present

## 2020-10-15 DIAGNOSIS — E1140 Type 2 diabetes mellitus with diabetic neuropathy, unspecified: Secondary | ICD-10-CM | POA: Diagnosis present

## 2020-10-15 DIAGNOSIS — E861 Hypovolemia: Secondary | ICD-10-CM | POA: Diagnosis present

## 2020-10-15 DIAGNOSIS — M19042 Primary osteoarthritis, left hand: Secondary | ICD-10-CM | POA: Diagnosis present

## 2020-10-15 DIAGNOSIS — M19041 Primary osteoarthritis, right hand: Secondary | ICD-10-CM | POA: Diagnosis not present

## 2020-10-15 DIAGNOSIS — Z886 Allergy status to analgesic agent status: Secondary | ICD-10-CM

## 2020-10-15 DIAGNOSIS — Z882 Allergy status to sulfonamides status: Secondary | ICD-10-CM

## 2020-10-15 DIAGNOSIS — Z7951 Long term (current) use of inhaled steroids: Secondary | ICD-10-CM

## 2020-10-15 DIAGNOSIS — Z885 Allergy status to narcotic agent status: Secondary | ICD-10-CM

## 2020-10-15 HISTORY — DX: Type 2 diabetes mellitus with ketoacidosis without coma: E11.10

## 2020-10-15 LAB — COMPREHENSIVE METABOLIC PANEL
ALT: 21 U/L (ref 0–44)
AST: 55 U/L — ABNORMAL HIGH (ref 15–41)
Albumin: 4 g/dL (ref 3.5–5.0)
Alkaline Phosphatase: 91 U/L (ref 38–126)
Anion gap: 17 — ABNORMAL HIGH (ref 5–15)
BUN: 22 mg/dL — ABNORMAL HIGH (ref 6–20)
CO2: 14 mmol/L — ABNORMAL LOW (ref 22–32)
Calcium: 10 mg/dL (ref 8.9–10.3)
Chloride: 92 mmol/L — ABNORMAL LOW (ref 98–111)
Creatinine, Ser: 2.27 mg/dL — ABNORMAL HIGH (ref 0.44–1.00)
GFR, Estimated: 27 mL/min — ABNORMAL LOW (ref >60)
Glucose, Bld: 577 mg/dL (ref 70–99)
Potassium: >7.5 mmol/L (ref 3.5–5.1)
Sodium: 123 mmol/L — ABNORMAL LOW (ref 135–145)
Total Bilirubin: 1.3 mg/dL — ABNORMAL HIGH (ref 0.3–1.2)
Total Protein: 8.2 g/dL — ABNORMAL HIGH (ref 6.5–8.1)

## 2020-10-15 LAB — URINALYSIS, ROUTINE W REFLEX MICROSCOPIC
Bilirubin Urine: NEGATIVE
Glucose, UA: >=500 mg/dL — AB
Ketones, ur: 20 mg/dL — AB
Leukocytes,Ua: NEGATIVE
Nitrite: NEGATIVE
Protein, ur: NEGATIVE mg/dL
Specific Gravity, Urine: 1.014 (ref 1.005–1.030)
pH: 5 (ref 5.0–8.0)

## 2020-10-15 LAB — I-STAT VENOUS BLOOD GAS, ED
Acid-base deficit: 10 mmol/L — ABNORMAL HIGH (ref 0.0–2.0)
Bicarbonate: 15.2 mmol/L — ABNORMAL LOW (ref 20.0–28.0)
Calcium, Ion: 1.18 mmol/L (ref 1.15–1.40)
HCT: 40 % (ref 36.0–46.0)
Hemoglobin: 13.6 g/dL (ref 12.0–15.0)
O2 Saturation: 89 %
Potassium: >8.5 mmol/L (ref 3.5–5.1)
Sodium: 123 mmol/L — ABNORMAL LOW (ref 135–145)
TCO2: 16 mmol/L — ABNORMAL LOW (ref 22–32)
pCO2, Ven: 30.5 mmHg — ABNORMAL LOW (ref 44.0–60.0)
pH, Ven: 7.306 (ref 7.250–7.430)
pO2, Ven: 61 mmHg — ABNORMAL HIGH (ref 32.0–45.0)

## 2020-10-15 LAB — I-STAT BETA HCG BLOOD, ED (MC, WL, AP ONLY): I-stat hCG, quantitative: <5 m[IU]/mL (ref <5)

## 2020-10-15 LAB — CBG MONITORING, ED
Glucose-Capillary: 526 mg/dL (ref 70–99)
Glucose-Capillary: 463 mg/dL — ABNORMAL HIGH (ref 70–99)
Glucose-Capillary: 403 mg/dL — ABNORMAL HIGH (ref 70–99)
Glucose-Capillary: 447 mg/dL — ABNORMAL HIGH (ref 70–99)
Glucose-Capillary: 337 mg/dL — ABNORMAL HIGH (ref 70–99)
Glucose-Capillary: 281 mg/dL — ABNORMAL HIGH (ref 70–99)
Glucose-Capillary: 276 mg/dL — ABNORMAL HIGH (ref 70–99)

## 2020-10-15 LAB — BASIC METABOLIC PANEL
Anion gap: 17 — ABNORMAL HIGH (ref 5–15)
BUN: 20 mg/dL (ref 6–20)
CO2: 15 mmol/L — ABNORMAL LOW (ref 22–32)
Calcium: 10 mg/dL (ref 8.9–10.3)
Chloride: 101 mmol/L (ref 98–111)
Creatinine, Ser: 1.95 mg/dL — ABNORMAL HIGH (ref 0.44–1.00)
GFR, Estimated: 32 mL/min — ABNORMAL LOW (ref >60)
Glucose, Bld: 371 mg/dL — ABNORMAL HIGH (ref 70–99)
Potassium: 5.3 mmol/L — ABNORMAL HIGH (ref 3.5–5.1)
Sodium: 133 mmol/L — ABNORMAL LOW (ref 135–145)

## 2020-10-15 LAB — SALICYLATE LEVEL: Salicylate Lvl: <7 mg/dL — ABNORMAL LOW (ref 7.0–30.0)

## 2020-10-15 LAB — CBC
HCT: 37.4 % (ref 36.0–46.0)
Hemoglobin: 11 g/dL — ABNORMAL LOW (ref 12.0–15.0)
MCH: 21.3 pg — ABNORMAL LOW (ref 26.0–34.0)
MCHC: 29.4 g/dL — ABNORMAL LOW (ref 30.0–36.0)
MCV: 72.5 fL — ABNORMAL LOW (ref 80.0–100.0)
Platelets: 341 10*3/uL (ref 150–400)
RBC: 5.16 MIL/uL — ABNORMAL HIGH (ref 3.87–5.11)
RDW: 23.5 % — ABNORMAL HIGH (ref 11.5–15.5)
WBC: 19.2 10*3/uL — ABNORMAL HIGH (ref 4.0–10.5)
nRBC: 0 % (ref 0.0–0.2)

## 2020-10-15 LAB — RESP PANEL BY RT-PCR (FLU A&B, COVID) ARPGX2
Influenza A by PCR: NEGATIVE
Influenza B by PCR: NEGATIVE
SARS Coronavirus 2 by RT PCR: NEGATIVE

## 2020-10-15 LAB — LACTIC ACID, PLASMA
Lactic Acid, Venous: 3.3 mmol/L (ref 0.5–1.9)
Lactic Acid, Venous: 4.1 mmol/L (ref 0.5–1.9)

## 2020-10-15 LAB — BETA-HYDROXYBUTYRIC ACID: Beta-Hydroxybutyric Acid: 3.73 mmol/L — ABNORMAL HIGH (ref 0.05–0.27)

## 2020-10-15 LAB — ACETAMINOPHEN LEVEL: Acetaminophen (Tylenol), Serum: <10 ug/mL — ABNORMAL LOW (ref 10–30)

## 2020-10-15 LAB — ETHANOL: Alcohol, Ethyl (B): <10 mg/dL (ref <10)

## 2020-10-15 MED ORDER — ALBUTEROL SULFATE (2.5 MG/3ML) 0.083% IN NEBU
10.0000 mg | INHALATION_SOLUTION | Freq: Once | RESPIRATORY_TRACT | Status: AC
  Administered 2020-10-15: 10 mg via RESPIRATORY_TRACT
  Filled 2020-10-15: qty 12

## 2020-10-15 MED ORDER — SODIUM BICARBONATE 8.4 % IV SOLN
50.0000 meq | Freq: Once | INTRAVENOUS | Status: AC
  Administered 2020-10-15: 50 meq via INTRAVENOUS

## 2020-10-15 MED ORDER — INSULIN REGULAR(HUMAN) IN NACL 100-0.9 UT/100ML-% IV SOLN
INTRAVENOUS | Status: DC
  Filled 2020-10-15: qty 100

## 2020-10-15 MED ORDER — SUCRALFATE 1 GM/10ML PO SUSP
1.0000 g | Freq: Three times a day (TID) | ORAL | Status: DC
  Administered 2020-10-16 – 2020-10-17 (×5): 1 g via ORAL
  Filled 2020-10-15 (×7): qty 10

## 2020-10-15 MED ORDER — MOMETASONE FURO-FORMOTEROL FUM 200-5 MCG/ACT IN AERO
2.0000 | INHALATION_SPRAY | Freq: Two times a day (BID) | RESPIRATORY_TRACT | Status: DC
  Administered 2020-10-16 – 2020-10-17 (×3): 2 via RESPIRATORY_TRACT
  Filled 2020-10-15: qty 8.8

## 2020-10-15 MED ORDER — ROSUVASTATIN CALCIUM 5 MG PO TABS
5.0000 mg | ORAL_TABLET | Freq: Every day | ORAL | Status: DC
  Administered 2020-10-16 – 2020-10-17 (×2): 5 mg via ORAL
  Filled 2020-10-15 (×2): qty 1

## 2020-10-15 MED ORDER — BUPROPION HCL ER (SR) 150 MG PO TB12
150.0000 mg | ORAL_TABLET | Freq: Two times a day (BID) | ORAL | Status: DC
  Administered 2020-10-16 – 2020-10-17 (×3): 150 mg via ORAL
  Filled 2020-10-15 (×4): qty 1

## 2020-10-15 MED ORDER — DEXTROSE IN LACTATED RINGERS 5 % IV SOLN: INTRAVENOUS | Status: DC

## 2020-10-15 MED ORDER — FLUTICASONE PROPIONATE 50 MCG/ACT NA SUSP
2.0000 | Freq: Every day | NASAL | Status: DC
  Administered 2020-10-16 – 2020-10-17 (×2): 2 via NASAL
  Filled 2020-10-15 (×3): qty 16

## 2020-10-15 MED ORDER — INSULIN REGULAR(HUMAN) IN NACL 100-0.9 UT/100ML-% IV SOLN
INTRAVENOUS | Status: DC
  Administered 2020-10-15: 10.5 [IU]/h via INTRAVENOUS
  Filled 2020-10-15: qty 100

## 2020-10-15 MED ORDER — AMITRIPTYLINE HCL 50 MG PO TABS
75.0000 mg | ORAL_TABLET | Freq: Every day | ORAL | Status: DC
  Administered 2020-10-16: 75 mg via ORAL
  Filled 2020-10-15: qty 2

## 2020-10-15 MED ORDER — LACTATED RINGERS IV BOLUS: 20.0000 mL/kg | Freq: Once | INTRAVENOUS | Status: DC

## 2020-10-15 MED ORDER — CALCIUM GLUCONATE 10 % IV SOLN
1.0000 g | Freq: Once | INTRAVENOUS | Status: AC
  Administered 2020-10-15: 1 g via INTRAVENOUS

## 2020-10-15 MED ORDER — UMECLIDINIUM BROMIDE 62.5 MCG/INH IN AEPB
1.0000 | INHALATION_SPRAY | Freq: Every day | RESPIRATORY_TRACT | Status: DC
  Administered 2020-10-16 – 2020-10-17 (×2): 1 via RESPIRATORY_TRACT
  Filled 2020-10-15: qty 7

## 2020-10-15 MED ORDER — CALCIUM GLUCONATE-NACL 1-0.675 GM/50ML-% IV SOLN
INTRAVENOUS | Status: AC
  Filled 2020-10-15: qty 50

## 2020-10-15 MED ORDER — FUROSEMIDE 10 MG/ML IJ SOLN
120.0000 mg | Freq: Once | INTRAVENOUS | Status: AC
  Administered 2020-10-15: 120 mg via INTRAVENOUS
  Filled 2020-10-15: qty 10

## 2020-10-15 MED ORDER — INSULIN ASPART 100 UNIT/ML IV SOLN
10.0000 [IU] | Freq: Once | INTRAVENOUS | Status: AC
  Administered 2020-10-15: 10 [IU] via INTRAVENOUS

## 2020-10-15 MED ORDER — LORATADINE 10 MG PO TABS
10.0000 mg | ORAL_TABLET | Freq: Every day | ORAL | Status: DC
  Administered 2020-10-16 – 2020-10-17 (×2): 10 mg via ORAL
  Filled 2020-10-15 (×2): qty 1

## 2020-10-15 MED ORDER — LACTATED RINGERS IV SOLN: INTRAVENOUS | Status: DC

## 2020-10-15 MED ORDER — ENOXAPARIN SODIUM 40 MG/0.4ML ~~LOC~~ SOLN
40.0000 mg | Freq: Every day | SUBCUTANEOUS | Status: DC
  Administered 2020-10-16: 40 mg via SUBCUTANEOUS
  Filled 2020-10-15: qty 0.4

## 2020-10-15 MED ORDER — SODIUM CHLORIDE 0.9 % IV BOLUS
1000.0000 mL | Freq: Once | INTRAVENOUS | Status: AC
  Administered 2020-10-15: 1000 mL via INTRAVENOUS

## 2020-10-15 MED ORDER — SODIUM ZIRCONIUM CYCLOSILICATE 10 G PO PACK
20.0000 g | PACK | Freq: Once | ORAL | Status: AC
  Administered 2020-10-15: 20 g via ORAL
  Filled 2020-10-15: qty 2

## 2020-10-15 MED ORDER — DEXTROSE 50 % IV SOLN
0.0000 mL | INTRAVENOUS | Status: DC | PRN
Start: 2020-10-15 — End: 2020-10-17

## 2020-10-15 MED ORDER — ALBUTEROL SULFATE (2.5 MG/3ML) 0.083% IN NEBU: 2.5000 mg | INHALATION_SOLUTION | Freq: Four times a day (QID) | RESPIRATORY_TRACT | Status: DC | PRN

## 2020-10-15 MED ORDER — DEXTROSE-NACL 5-0.45 % IV SOLN: INTRAVENOUS | Status: DC

## 2020-10-15 MED ORDER — FERROUS SULFATE 325 (65 FE) MG PO TABS
325.0000 mg | ORAL_TABLET | Freq: Every day | ORAL | Status: DC
  Administered 2020-10-17: 325 mg via ORAL
  Filled 2020-10-15: qty 1

## 2020-10-15 MED ORDER — SODIUM CHLORIDE 0.9 % IV SOLN: INTRAVENOUS | Status: AC

## 2020-10-15 MED ORDER — DEXTROSE 50 % IV SOLN: 0.0000 mL | INTRAVENOUS | Status: DC | PRN

## 2020-10-15 MED ORDER — SODIUM CHLORIDE 0.9 % IV BOLUS
1000.0000 mL | Freq: Once | INTRAVENOUS | Status: AC
  Administered 2020-10-16: 1000 mL via INTRAVENOUS

## 2020-10-15 NOTE — ED Notes (Signed)
MD Bero made aware of abnormal lab values. Orders to be placed.

## 2020-10-15 NOTE — H&P (Signed)
History and Physical    Jill Shaw YZX:[id number] DOB: 11/29/1975 DOA: 10/15/2020  PCP: Barbette Merino, NP Patient coming from: Home  Chief Complaint: Altered mental status, hyperglycemia  HPI: Jill Shaw is a 44 y.o. female with medical history significant of insulin-dependent type 2 diabetes, CKD stage IIIa, hypertension, chronic diastolic CHF, asthma, COPD, GERD, morbid obesity (BMI 45.15), chronic microcytic anemia, uterine dysfunction, recurrent syncope, history of seizures presented to the ED via EMS for evaluation of altered mental status and hyperglycemia.  Family reported that patient was acting a bit abnormal yesterday and felt generally unwell.  Today they went to check on her and found out that she was unresponsive and EMS was called.  At the time of EMS arrival, patient was obtunded, tachycardic, hypotensive, and blood glucose above 500.  Rhythm strip showing peaked T waves and widened QRS complexes.  Patient reports compliance with her home diabetes medications.  She takes Humalog 70-3110 units twice daily and Onglyza.  She denies any recent changes in her diet but her mother at bedside is concerned that the patient has been eating more sweets such as cookies and cakes due to the holidays.  Patient is complaining of severe generalized abdominal pain.  States she had endometrial ablation done in November 2020 and since then has had ongoing abdominal pain but the pain is now much worse, 10 out of 10 in intensity.  She has also had intermittent episodes of vaginal bleeding which is currently controlled with an oral contraceptive.  She is concerned that her blood sugar is high because she has an infection.  She has been feeling ill lately.  Denies fevers, chills, cough, shortness of breath, chest pain, nausea, vomiting, diarrhea, or dysuria.  She is not vaccinated against Covid.  ED Course: Afebrile.  Tachycardic and tachypneic.  Hypotensive with blood pressure in the 80s over 50s  initially, improved with IV fluids.  Not hypoxemic.  Labs showing WBC 19.2, hemoglobin 11.0, hematocrit 37.4, MCV 72.5, platelet count 341K.  Sodium 123, potassium >7.5, chloride 92, bicarb 14, anion gap 17, BUN 22, creatinine 2.2, glucose 577.  UA with ketones.  Beta hydroxybutyric acid 3.73.  VBG with pH 7.30.  AST 55, ALT 21, alk phos 91, T bili 1.3.  Lactic acid 3.3 >4.1.  Blood ethanol, acetaminophen, and salicylate levels undetectable.  Beta hCG negative.  SARS-CoV-2 PCR test and influenza panel both negative.    Chest x-ray not suggestive of pneumonia.  EKG with wide QRS complexes and peaked T waves.  After discussion with nephrology, patient was given medical treatment for hyperkalemia including calcium gluconate, albuterol nebulizer treatment, insulin/dextrose, Lokelma, and IV Lasix 120 mg.  Repeat potassium improved to 5.3.  Repeat EKG back to baseline.  She was started on insulin infusion per DKA protocol and given 2 L normal saline boluses.  Review of Systems:  All systems reviewed and apart from history of presenting illness, are negative.  Past Medical History:  Diagnosis Date  . Anemia   . Arthritis    knees, hands  . Asthma   . Chronic diastolic (congestive) heart failure (HCC)   . COPD (chronic obstructive pulmonary disease) (HCC)   . Diabetes mellitus without complication (HCC)    type 2  . Dysfunctional uterine bleeding   . GERD (gastroesophageal reflux disease)   . Hypertension   . Neuromuscular disorder (HCC)    neuropathy feet  . Seizures (HCC) 09/12/2017   pt states r/t stress and blood sugar - no  meds last one 4 months ago, not seen neurologist  . Sickle cell trait ([place])   . Smoker   . Vitamin D deficiency 10/2019  . Wears glasses     Past Surgical History:  Procedure Laterality Date  . CESAREAN SECTION     x 1. for twins  . DILATION AND CURETTAGE OF UTERUS N/A 08/20/2019   Procedure: DILATATION AND CURETTAGE;  Surgeon: Emily Filbert, MD;  Location: [place];  Service: Gynecology;  Laterality: N/A;  . ENDOMETRIAL ABLATION N/A 08/20/2019   Procedure: [place] Ablation;  Surgeon: Emily Filbert, MD;  Location: [place];  Service: Gynecology;  Laterality: N/A;  . EYE SURGERY Bilateral    laser right and cataract removed left eye  . RADIOLOGY WITH ANESTHESIA N/A 09/16/2019   Procedure: MRI WITH ANESTHESIA   L SPINE WITHOUT CONTRAST, T SPINE WITHOUT CONTRAST , CERVICAL WITHOUT CONTRAST;  Surgeon: Radiologist, Medication, MD;  Location: [place];  Service: Radiology;  Laterality: N/A;  . TUBAL LIGATION     interval BTL  . UPPER GI ENDOSCOPY  07/2017     reports that she quit smoking about 4 weeks ago. Her smoking use included cigarettes. She has a 6.50 pack-year smoking history. She has never used smokeless tobacco. She reports that she does not drink alcohol and does not use drugs.  Allergies  Allergen Reactions  . Ketoprofen Nausea And Vomiting  . Aspirin Nausea Only  . Gabapentin Nausea And Vomiting and Other (See Comments)    upset stomach  . Ibuprofen Nausea And Vomiting  . Liraglutide Nausea And Vomiting  . Naproxen Nausea And Vomiting  . Omeprazole-Sodium Bicarbonate Nausea And Vomiting  . Sulfa Antibiotics Nausea And Vomiting  . Tramadol Nausea And Vomiting and Other (See Comments)    stomach upset    Family History  Problem Relation Age of Onset  . Diabetes Mother   . Hypertension Mother     Prior to Admission medications   Medication Sig Start Date End Date Taking? Authorizing Provider  albuterol (PROVENTIL) (2.5 MG/3ML) 0.083% nebulizer solution Take 3 mLs (2.5 mg total) by nebulization every 6 (six) hours as needed for wheezing or shortness of breath. 09/27/20  Yes Icard, Bradley L, DO  albuterol (VENTOLIN HFA) 108 (90 Base) MCG/ACT inhaler Inhale 2 puffs into the lungs every 6 (six) hours as needed for wheezing or shortness of breath. 09/27/20  Yes Icard, Octavio Graves, DO  amitriptyline (ELAVIL) 75 MG tablet Take 1 tablet (75 mg total)  by mouth at bedtime. 10/13/20  Yes Raulkar, Clide Deutscher, MD  budesonide-formoterol (SYMBICORT) 160-4.5 MCG/ACT inhaler INHALE 2 PUFFS INTO THE LUNGS 2 (TWO) TIMES DAILY. Patient taking differently: Inhale 2 puffs into the lungs 2 (two) times daily. 09/27/20  Yes Icard, Octavio Graves, DO  buPROPion (WELLBUTRIN SR) 150 MG 12 hr tablet Take 1 tablet (150 mg total) by mouth 2 (two) times daily. 07/19/20 07/19/21 Yes Vevelyn Francois, NP  cetirizine (ZYRTEC) 10 MG tablet Take 1 tablet (10 mg total) by mouth daily. 10/07/20  Yes Vevelyn Francois, NP  diclofenac (VOLTAREN) 75 MG EC tablet Take 75 mg by mouth 2 (two) times daily as needed for mild pain. 07/06/20  Yes [provider]  diclofenac Sodium (VOLTAREN) 1 % GEL Apply 4 g topically 4 (four) times daily as needed (pain). 08/15/20  Yes [provider]  ferrous sulfate 325 (65 FE) MG tablet Take 1 tablet (325 mg total) by mouth 3 (three) times daily with meals. Patient  taking differently: Take 325 mg by mouth daily with breakfast. 12/02/19  Yes Vevelyn Francois, NP  fluticasone (FLONASE) 50 MCG/ACT nasal spray Place 2 sprays into both nostrils daily. 11/24/19  Yes Vevelyn Francois, NP  furosemide (LASIX) 40 MG tablet Take 1 tablet (40 mg total) by mouth 2 (two) times daily. 08/11/20  Yes Lorretta Harp, MD  Glucosamine Sulfate 1000 MG CAPS Take 1 capsule (1,000 mg total) by mouth 2 (two) times daily. Patient taking differently: Take 1,000 mg by mouth 2 (two) times daily. 08/22/19  Yes Hilts, Legrand Como, MD  hydrocortisone 2.5 % cream Apply topically 2 (two) times daily. Patient taking differently: Apply 1 application topically 2 (two) times daily. 09/22/20  Yes Vevelyn Francois, NP  HYDROmorphone (DILAUDID) 2 MG tablet Take 1-2 tablets (2-4 mg total) by mouth every 4 (four) hours as needed for severe pain. 07/30/20  Yes Donnamae Jude, MD  hydroquinone 4 % cream APPLY TOPICALLY TWICE DAILY Patient taking differently: Apply 1 application topically 2  (two) times daily. 09/13/20  Yes King, Diona Foley, NP  Insulin Lispro Prot & Lispro (HUMALOG MIX 75/25 KWIKPEN) (75-25) 100 UNIT/ML Kwikpen Inject 120 Units into the skin 2 (two) times daily. INJECT 110 UNITS EVERY 12 HOURS Patient taking differently: Inject 110 Units into the skin 2 (two) times daily. 09/13/20 09/13/21 Yes King, Diona Foley, NP  lidocaine (XYLOCAINE) 5 % ointment Apply 1 application topically as needed. Patient taking differently: Apply 1 application topically as needed for mild pain. 09/16/20  Yes Patel, Donika K, DO  losartan (COZAAR) 50 MG tablet Take 1 tablet (50 mg total) by mouth daily. 08/11/20  Yes Lorretta Harp, MD  meloxicam (MOBIC) 7.5 MG tablet Take 1 tablet (7.5 mg total) by mouth daily. 08/27/20  Yes Persons, Bevely [name], PA  metoprolol succinate (TOPROL-XL) 200 MG 24 hr tablet Take 1 tablet (200 mg total) by mouth daily. Take with or immediately following a meal. Patient taking differently: Take 200 mg by mouth daily. 10/14/20  Yes Patwardhan, Reynold Bowen, MD  norethindrone (AYGESTIN) 5 MG tablet Take 2 tablets (10 mg total) by mouth in the morning, at noon, in the evening, and at bedtime. With bleeding 08/12/20  Yes Aletha Halim, MD  omeprazole (PRILOSEC) 40 MG capsule TAKE 1 CAPSULE($RemoveBefore'40MG'[id number]$  TOTAL) BY MOUTH EVERY DAY Patient taking differently: Take 40 mg by mouth daily. 09/23/20  Yes Vevelyn Francois, NP  promethazine (PHENERGAN) 12.5 MG tablet Take 1 tablet (12.5 mg total) by mouth every 6 (six) hours as needed for nausea or vomiting. 03/10/20  Yes Patrecia Pour, MD  rosuvastatin (CRESTOR) 5 MG tablet Take 1 tablet (5 mg total) by mouth daily. 08/11/20  Yes Lorretta Harp, MD  saxagliptin HCl (ONGLYZA) 2.5 MG TABS tablet Take 1 tablet (2.5 mg total) by mouth daily. 12/02/19  Yes Vevelyn Francois, NP  spironolactone (ALDACTONE) 50 MG tablet Take 1 tablet (50 mg total) by mouth daily. 09/01/20 11/30/20 Yes Patwardhan, Manish J, MD  sucralfate (CARAFATE) 1 GM/10ML suspension  Take 10 mLs (1 g total) by mouth 4 (four) times daily -  with meals and at bedtime. 07/04/20  Yes Bonnielee Haff, MD  Tiotropium Bromide Monohydrate (SPIRIVA RESPIMAT) 2.5 MCG/ACT AERS Inhale 2 puffs into the lungs daily. 09/27/20  Yes Icard, Bradley L, DO  Turmeric 500 MG CAPS Take 500 mg by mouth 2 (two) times daily. 08/22/19  Yes Hilts, Legrand Como, MD  Vitamin D, Ergocalciferol, (DRISDOL) 1.25 MG (50000 UNIT) CAPS capsule  Take 1 capsule (50,000 Units total) by mouth every 7 (seven) days. Patient taking differently: Take 50,000 Units by mouth every Friday. 12/02/19  Yes Vevelyn Francois, NP  blood glucose meter kit and supplies KIT 1 each by Other route See admin instructions. Dispense based on patient and insurance preference. Use up to four times daily as directed. (FOR ICD-9 250.00, 250.01). 09/02/20   Vevelyn Francois, NP  Blood Glucose Monitoring Suppl (TRUE METRIX METER) w/Device KIT 1 each by Does not apply route 4 (four) times daily -  before meals and at bedtime. 01/21/18   Dorena Dew, FNP  Cholecalciferol (VITAMIN D-3) 125 MCG (5000 UT) TABS Take 1 tablet by mouth daily. Patient not taking: No sig reported 08/22/19   Hilts, Michael, MD  Continuous Blood Gluc Receiver (FREESTYLE LIBRE 14 DAY READER) DEVI 1 application by Does not apply route every 14 (fourteen) days. 08/13/20 08/13/21  Vevelyn Francois, NP  glucose blood (TRUE METRIX BLOOD GLUCOSE TEST) test strip Use as instructed 07/19/20   Vevelyn Francois, NP  Insulin Pen Needle (PEN NEEDLES) 30G X 5 MM MISC 1 Units by Does not apply route as directed. 12/02/19   Vevelyn Francois, NP  metoCLOPramide (REGLAN) 10 MG tablet Take 1 tablet (10 mg total) by mouth 4 (four) times daily -  before meals and at bedtime. 07/04/20   Bonnielee Haff, MD  nicotine (NICODERM CQ) 21 mg/24hr patch Place 1 patch (21 mg total) onto the skin daily. Patient not taking: No sig reported 07/12/20   Lauraine Rinne, NP  nicotine polacrilex (COMMIT) 4 MG lozenge Take 1  lozenge (4 mg total) by mouth as needed for smoking cessation. Patient not taking: No sig reported 07/12/20   Lauraine Rinne, NP  TRUEplus Lancets 28G MISC 1 each by Other route in the morning, at noon, in the evening, and at bedtime. 02/12/20   [provider]  hydrALAZINE (APRESOLINE) 50 MG tablet Take 1 tablet (50 mg total) by mouth 3 (three) times daily. 02/03/20 02/20/20  Thurnell Lose, MD    Physical Exam: Vitals:   10/15/20 2100 10/15/20 2145 10/15/20 2200 10/15/20 2230  BP: (!) 117/55 128/71 (!) 114/55 122/64  Pulse: (!) 120 (!) 116 (!) 113 (!) 110  Resp: 19 19 (!) 21 (!) 26  Temp:      TempSrc:      SpO2: 99% 99% 100% 100%  Weight:      Height:        Physical Exam Constitutional:      General: She is not in acute distress. HENT:     Head: Normocephalic and atraumatic.     Mouth/Throat:     Comments: Very dry mucous membranes Eyes:     Extraocular Movements: Extraocular movements intact.     Conjunctiva/sclera: Conjunctivae normal.  Cardiovascular:     Rate and Rhythm: Normal rate and regular rhythm.     Pulses: Normal pulses.  Pulmonary:     Effort: Pulmonary effort is normal. No respiratory distress.     Breath sounds: Normal breath sounds. No wheezing or rales.  Abdominal:     General: Bowel sounds are normal. There is no distension.     Palpations: Abdomen is soft.     Tenderness: There is abdominal tenderness. There is guarding. There is no rebound.     Comments: Generalized abdominal tenderness with guarding  Musculoskeletal:        General: No swelling or tenderness.  Cervical back: Normal range of motion and neck supple.  Skin:    General: Skin is warm and dry.  Neurological:     General: No focal deficit present.     Mental Status: She is alert and oriented to person, place, and time.     Comments: Speech fluent, tongue midline, no focal motor or sensory deficit     Labs on Admission: I have personally reviewed following labs and imaging  studies  CBC: Recent Labs  Lab 10/15/20 1742 10/15/20 1750  WBC 19.2*  --   HGB 11.0* 13.6  HCT 37.4 40.0  MCV 72.5*  --   PLT 341  --    Basic Metabolic Panel: Recent Labs  Lab 10/15/20 1742 10/15/20 1750 10/15/20 2029  NA 123* 123* 133*  K >7.5* >8.5* 5.3*  CL 92*  --  101  CO2 14*  --  15*  GLUCOSE 577*  --  371*  BUN 22*  --  20  CREATININE 2.27*  --  1.95*  CALCIUM 10.0  --  10.0   GFR: Estimated Creatinine Clearance: 47.3 mL/min (A) (by C-G formula based on SCr of 1.95 mg/dL (H)). Liver Function Tests: Recent Labs  Lab 10/15/20 1742  AST 55*  ALT 21  ALKPHOS 91  BILITOT 1.3*  PROT 8.2*  ALBUMIN 4.0   No results for input(s): LIPASE, AMYLASE in the last 168 hours. No results for input(s): AMMONIA in the last 168 hours. Coagulation Profile: No results for input(s): INR, PROTIME in the last 168 hours. Cardiac Enzymes: No results for input(s): CKTOTAL, CKMB, CKMBINDEX, TROPONINI in the last 168 hours. BNP (last 3 results) Recent Labs    07/12/20 1649  PROBNP 57.0   HbA1C: No results for input(s): HGBA1C in the last 72 hours. CBG: Recent Labs  Lab 10/15/20 1939 10/15/20 2026 10/15/20 2104 10/15/20 2205 10/15/20 2309  GLUCAP 447* 403* 337* 281* 276*   Lipid Profile: No results for input(s): CHOL, HDL, LDLCALC, TRIG, CHOLHDL, LDLDIRECT in the last 72 hours. Thyroid Function Tests: No results for input(s): TSH, T4TOTAL, FREET4, T3FREE, THYROIDAB in the last 72 hours. Anemia Panel: No results for input(s): VITAMINB12, FOLATE, FERRITIN, TIBC, IRON, RETICCTPCT in the last 72 hours. Urine analysis:    Component Value Date/Time   COLORURINE YELLOW 10/15/2020 1942   APPEARANCEUR HAZY (A) 10/15/2020 1942   LABSPEC 1.014 10/15/2020 1942   PHURINE 5.0 10/15/2020 1942   GLUCOSEU >=500 (A) 10/15/2020 1942   HGBUR LARGE (A) 10/15/2020 1942   BILIRUBINUR NEGATIVE 10/15/2020 1942   BILIRUBINUR negative 06/02/2020 1419   BILIRUBINUR NEGATIVE 11/05/2019  1342   KETONESUR 20 (A) 10/15/2020 1942   PROTEINUR NEGATIVE 10/15/2020 1942   UROBILINOGEN 4.0 (A) 06/02/2020 1419   UROBILINOGEN 0.2 01/14/2018 1348   NITRITE NEGATIVE 10/15/2020 1942   LEUKOCYTESUR NEGATIVE 10/15/2020 1942    Radiological Exams on Admission: DG Chest Port 1 View  Result Date: 10/15/2020 CLINICAL DATA:  Altered mental status EXAM: PORTABLE CHEST 1 VIEW COMPARISON:  07/02/2020 FINDINGS: Shallow inspiration. Cardiac enlargement. Linear atelectasis or fibrosis in the mid lungs. No airspace disease or consolidation. No pleural effusions. No pneumothorax. IMPRESSION: Cardiac enlargement. Linear atelectasis or fibrosis in the mid lungs. Electronically Signed   By: Lucienne Capers M.D.   On: 10/15/2020 18:11    EKG: Independently reviewed.  Initial EKG with wide QRS complexes and peaked T waves.  Repeat EKG back to baseline.  Assessment/Plan Principal Problem:   DKA (diabetic ketoacidosis) ([place]) Active Problems:  AKI (acute kidney injury) ([place])   Hyperkalemia   Severe sepsis (HCC)   Acute metabolic encephalopathy   DKA in the setting of poorly controlled insulin-dependent type 2 diabetes: Suspect due to dietary indiscretion/increased carbohydrate intake in the setting of holidays.  A1c 9.2 on 07/19/2020.  Presenting with blood glucose above 500.  Bicarb 14, anion gap 17, UA with ketones, and beta hydroxybutyric acid 3.73.  VBG with pH 7.30.  Patient was started on insulin infusion and given 2 L normal saline boluses in the ED. -Continue insulin infusion per DKA protocol.  Continue IV fluid hydration with normal saline, when CBG less than 250, switch to D5-1/2 normal saline.  Keep n.p.o.  Frequent CBG checks per Endo tool.  Monitor BMP and beta hydroxybutyric acid levels every 4 hours.  Repeat A1c.  When DKA resolves, initiate diet and subcutaneous insulin, continue IV insulin for an additional 1 to 2 hours.  Diabetes coordinator consulted.  AKI on CKD stage IIIa: Likely  prerenal from dehydration in the setting of of severe hyperglycemia/DKA.  In addition, home diuretic, ARB, and NSAID use also likely contributing.  BUN 22, creatinine 2.2.  Baseline creatinine is around 1.2.  Creatinine now improved to 1.9 after patient was given IV fluids. -Continue IV fluid hydration.  Monitor renal function and urine output.  Hold spironolactone, Lasix, losartan, and Mobic.  Avoid any other nephrotoxic agents.  Severe hyperkalemia with EKG changes: Potassium >7.5.  Initial EKG showing peaked T waves in widened QRS complexes. After discussion with nephrology, patient was given medical treatment for hyperkalemia including calcium gluconate, albuterol nebulizer treatment, insulin/dextrose, Lokelma, and IV Lasix 120 mg.  Repeat potassium improved to 5.3.  Repeat EKG back to baseline.  Suspect severe hyperkalemia was due to AKI and home spironolactone plus losartan use. -Hold spironolactone and losartan.  Currently on insulin infusion for DKA.  Continue to monitor potassium level closely.  Possible severe sepsis: No fever.  Tachycardic and tachypneic in the setting of DKA.  She was hypotensive on arrival to the ED with blood pressure in the 80s over 50s initially, now improved with IV fluids.  However, does have leukocytosis on labs (WBC 19.2) and worsening lactic acidosis (3.3 >4.1) despite IV fluids/DKA treatment.  Source of infection not entirely clear at this time.  UA not suggestive of infection.  Chest x-ray not suggestive of pneumonia.  SARS-CoV-2 PCR test and influenza panel both negative.  Her mental status has improved with insulin and fluids, no meningeal signs. -Blood culture x2 ordered and broad-spectrum antibiotics started.  Continue IV fluid hydration, additional bolus ordered.  Trend lactate.  Check procalcitonin level.  Continue to monitor WBC count.  Will order CT abdomen pelvis to look for source of infection given complaints of abdominal pain.  Acute metabolic  encephalopathy: Likely due to DKA.  Patient was obtunded at the time of EMS arrival but has now improved significantly after treatment for DKA.  Currently fully awake and alert, answering questions.  Neuro exam nonfocal. -Continue to monitor  Pseudohyponatremia: Due to severe hyperglycemia.  Sodium 123, blood glucose 577.  Corrected sodium 131. -Continue IV fluid hydration and monitor sodium level  Hypertension -Hold antihypertensives given concern for severe sepsis  Hyperlipidemia -Continue home Crestor  Chronic diastolic CHF: Hypovolemic at present. -Hold diuretics  Asthma/COPD: Stable.  No signs of acute exacerbation at this time. -Continue home inhalers  Chronic microcytic anemia: Stable.  Hemoglobin 11.0, at baseline. -Takes an iron supplement at home, continue.  DVT prophylaxis: Lovenox  Code Status: Full code Family Communication: No family available at this time. Disposition Plan: Status is: Inpatient  Remains inpatient appropriate because:IV treatments appropriate due to intensity of illness or inability to take PO and Inpatient level of care appropriate due to severity of illness   Dispo: The patient is from: Home              Anticipated d/c is to: Home              Anticipated d/c date is: 3 days              Patient currently is not medically stable to d/c.  The medical decision making on this patient was of high complexity and the patient is at high risk for clinical deterioration, therefore this is a level 3 visit.  Shela Leff MD [facility]  If 7PM-7AM, please contact night-coverage [email or web address]  10/15/2020, 11:56 PM

## 2020-10-15 NOTE — ED Notes (Signed)
Pt's CBG result was 570. Informed [place] - RN.

## 2020-10-15 NOTE — ED Notes (Signed)
Pharmacy paged to verify medications when available.

## 2020-10-15 NOTE — ED Triage Notes (Signed)
Pt BIB [facility] from Home with AMS.  Pt LSN was 1100, 10/05/2020. Family came at a later time and noted pt was unresponsive.  Upon EMS arrival, pt was noted to be unresponsive and had a high CBG.   Pt hypotensive, tachycardic, and responsive only to pain.

## 2020-10-15 NOTE — ED Provider Notes (Signed)
[facility] Emergency Department Provider Note MRN:  [id number]  Arrival date & time: 10/15/20     Chief Complaint   Altered Mental Status   History of Present Illness   Jill HighmanJulie M Shaw is a 44 y.o. year-old female with a history of diabetes, CHF, COPD presenting to the ED with chief complaint of altered mental status.  Patient was acting a bit abnormal yesterday and felt generally unwell, today checked on by family and she was unresponsive. EMS was called. Noted abnormal rhythm strip, patient is unresponsive.  I was unable to obtain an accurate HPI, PMH, or ROS due to the patient's altered mental status.  Level 5 caveat.  Review of Systems  Positive for altered mental status, malaise.  Patient's Health History    Past Medical History:  Diagnosis Date  . Anemia   . Arthritis    knees, hands  . Asthma   . Chronic diastolic (congestive) heart failure (HCC)   . COPD (chronic obstructive pulmonary disease) (HCC)   . Diabetes mellitus without complication (HCC)    type 2  . Dysfunctional uterine bleeding   . GERD (gastroesophageal reflux disease)   . Hypertension   . Neuromuscular disorder (HCC)    neuropathy feet  . Seizures (HCC) 09/12/2017   pt states r/t stress and blood sugar - no meds last one 4 months ago, not seen neurologist  . Sickle cell trait (HCC)   . Smoker   . Vitamin D deficiency 10/2019  . Wears glasses     Past Surgical History:  Procedure Laterality Date  . CESAREAN SECTION     x 1. for twins  . DILATION AND CURETTAGE OF UTERUS N/A 08/20/2019   Procedure: DILATATION AND CURETTAGE;  Surgeon: Allie Bossierove, Myra C, MD;  Location: [facility] OR;  Service: Gynecology;  Laterality: N/A;  . ENDOMETRIAL ABLATION N/A 08/20/2019   Procedure: Minerva Ablation;  Surgeon: Allie Bossierove, Myra C, MD;  Location: [facility] OR;  Service: Gynecology;  Laterality: N/A;  . EYE SURGERY Bilateral    laser right and cataract removed left eye  . RADIOLOGY WITH ANESTHESIA N/A  09/16/2019   Procedure: MRI WITH ANESTHESIA   L SPINE WITHOUT CONTRAST, T SPINE WITHOUT CONTRAST , CERVICAL WITHOUT CONTRAST;  Surgeon: Radiologist, Medication, MD;  Location: [facility] OR;  Service: Radiology;  Laterality: N/A;  . TUBAL LIGATION     interval BTL  . UPPER GI ENDOSCOPY  07/2017    Family History  Problem Relation Age of Onset  . Diabetes Mother   . Hypertension Mother     Social History   Socioeconomic History  . Marital status: Legally Separated    Spouse name: Not on file  . Number of children: 3  . Years of education: Not on file  . Highest education level: Not on file  Occupational History  . Occupation: unemployed  Tobacco Use  . Smoking status: Former Smoker    Packs/day: 0.25    Years: 26.00    Pack years: 6.50    Types: Cigarettes    Quit date: 09/13/2020    Years since quitting: 0.0  . Smokeless tobacco: Never Used  . Tobacco comment: 4-5 cigarettes/day  Vaping Use  . Vaping Use: Never used  Substance and Sexual Activity  . Alcohol use: No  . Drug use: No  . Sexual activity: Not Currently    Birth control/protection: None  Other Topics Concern  . Not on file  Social History Narrative   Right Handed   Lives  in a one story apartment, but lives on the second floor   Drinks caffeine once in awhile   Social Determinants of Health   Financial Resource Strain: Not on file  Food Insecurity: No Food Insecurity  . Worried About Programme researcher, broadcasting/film/video in the Last Year: Never true  . Ran Out of Food in the Last Year: Never true  Transportation Needs: No Transportation Needs  . Lack of Transportation (Medical): No  . Lack of Transportation (Non-Medical): No  Physical Activity: Not on file  Stress: Not on file  Social Connections: Not on file  Intimate Partner Violence: Not on file     Physical Exam   Vitals:   10/15/20 2200 10/15/20 2230  BP: (!) 114/55 122/64  Pulse: (!) 113 (!) 110  Resp: (!) 21 (!) 26  Temp:    SpO2: 100% 100%     CONSTITUTIONAL: Ill-appearing, obese NEURO: Somnolent, minimally responsive to painful stimuli, protecting airway EYES:  eyes equal and reactive ENT/NECK:  no LAD, no JVD CARDIO: Tachycardic rate, well-perfused, normal S1 and S2 PULM:  CTAB no wheezing or rhonchi GI/GU:  normal bowel sounds, non-distended, non-tender MSK/SPINE:  No gross deformities, no edema SKIN:  no rash, atraumatic PSYCH: Unable to assess  *Additional and/or pertinent findings included in MDM below  Diagnostic and Interventional Summary    EKG Interpretation  Date/Time:  Friday October 15 2020 17:41:28 EST Ventricular Rate:  108 PR Interval:    QRS Duration: 207 QT Interval:  488 QTC Calculation: 655 R Axis:   -60 Text Interpretation: Junctional tachycardia Left bundle branch block Confirmed by Kennis Carina [phone]) on 10/15/2020 5:45:32 PM       EKG Interpretation  Date/Time:  Friday October 15 2020 18:03:23 EST Ventricular Rate:  114 PR Interval:    QRS Duration: 132 QT Interval:  349 QTC Calculation: 481 R Axis:   29 Text Interpretation: Sinus tachycardia Left bundle branch block Confirmed by Kennis Carina [phone]) on 10/15/2020 8:36:47 PM       Labs Reviewed  CBC - Abnormal; Notable for the following components:      Result Value   WBC 19.2 (*)    RBC 5.16 (*)    Hemoglobin 11.0 (*)    MCV 72.5 (*)    MCH 21.3 (*)    MCHC 29.4 (*)    RDW 23.5 (*)    All other components within normal limits  COMPREHENSIVE METABOLIC PANEL - Abnormal; Notable for the following components:   Sodium 123 (*)    Potassium >7.5 (*)    Chloride 92 (*)    CO2 14 (*)    Glucose, Bld 577 (*)    BUN 22 (*)    Creatinine, Ser 2.27 (*)    Total Protein 8.2 (*)    AST 55 (*)    Total Bilirubin 1.3 (*)    GFR, Estimated 27 (*)    Anion gap 17 (*)    All other components within normal limits  LACTIC ACID, PLASMA - Abnormal; Notable for the following components:   Lactic Acid, Venous 3.3 (*)    All other  components within normal limits  URINALYSIS, ROUTINE W REFLEX MICROSCOPIC - Abnormal; Notable for the following components:   APPearance HAZY (*)    Glucose, UA >=500 (*)    Hgb urine dipstick LARGE (*)    Ketones, ur 20 (*)    Bacteria, UA RARE (*)    All other components within normal limits  ACETAMINOPHEN LEVEL -  Abnormal; Notable for the following components:   Acetaminophen (Tylenol), Serum <10 (*)    All other components within normal limits  SALICYLATE LEVEL - Abnormal; Notable for the following components:   Salicylate Lvl <7.0 (*)    All other components within normal limits  BETA-HYDROXYBUTYRIC ACID - Abnormal; Notable for the following components:   Beta-Hydroxybutyric Acid 3.73 (*)    All other components within normal limits  BASIC METABOLIC PANEL - Abnormal; Notable for the following components:   Sodium 133 (*)    Potassium 5.3 (*)    CO2 15 (*)    Glucose, Bld 371 (*)    Creatinine, Ser 1.95 (*)    GFR, Estimated 32 (*)    Anion gap 17 (*)    All other components within normal limits  LACTIC ACID, PLASMA - Abnormal; Notable for the following components:   Lactic Acid, Venous 4.1 (*)    All other components within normal limits  I-STAT VENOUS BLOOD GAS, ED - Abnormal; Notable for the following components:   pCO2, Ven 30.5 (*)    pO2, Ven 61.0 (*)    Bicarbonate 15.2 (*)    TCO2 16 (*)    Acid-base deficit 10.0 (*)    Sodium 123 (*)    Potassium >8.5 (*)    All other components within normal limits  CBG MONITORING, ED - Abnormal; Notable for the following components:   Glucose-Capillary 526 (*)    All other components within normal limits  CBG MONITORING, ED - Abnormal; Notable for the following components:   Glucose-Capillary 463 (*)    All other components within normal limits  CBG MONITORING, ED - Abnormal; Notable for the following components:   Glucose-Capillary 447 (*)    All other components within normal limits  CBG MONITORING, ED - Abnormal;  Notable for the following components:   Glucose-Capillary 403 (*)    All other components within normal limits  CBG MONITORING, ED - Abnormal; Notable for the following components:   Glucose-Capillary 337 (*)    All other components within normal limits  CBG MONITORING, ED - Abnormal; Notable for the following components:   Glucose-Capillary 281 (*)    All other components within normal limits  CBG MONITORING, ED - Abnormal; Notable for the following components:   Glucose-Capillary 276 (*)    All other components within normal limits  RESP PANEL BY RT-PCR (FLU A&B, COVID) ARPGX2  ETHANOL  POTASSIUM  I-STAT CHEM 8, ED  I-STAT BETA HCG BLOOD, ED ([facility], [facility], [facility] ONLY)    DG Chest Port 1 View  Final Result      Medications  insulin regular, human (MYXREDLIN) 100 units/ 100 mL infusion (10 Units/hr Intravenous Rate/Dose Change 10/15/20 2310)  lactated ringers infusion ( Intravenous New Bag/Given 10/15/20 1806)  dextrose 5 % in lactated ringers infusion (0 mLs Intravenous Hold 10/15/20 1811)  dextrose 50 % solution 0-50 mL (has no administration in time range)  sodium chloride 0.9 % bolus 1,000 mL (0 mLs Intravenous Stopped 10/15/20 1908)  calcium gluconate inj 10% (1 g) URGENT USE ONLY! (1 g Intravenous Given 10/15/20 1746)  sodium chloride 0.9 % bolus 1,000 mL (0 mLs Intravenous Stopped 10/15/20 1908)  calcium gluconate inj 10% (1 g) URGENT USE ONLY! (1 g Intravenous Given 10/15/20 1756)  albuterol (PROVENTIL) (2.5 MG/3ML) 0.083% nebulizer solution 10 mg (10 mg Nebulization Given 10/15/20 1809)  sodium bicarbonate injection 50 mEq (50 mEq Intravenous Given 10/15/20 1809)  insulin aspart (novoLOG) injection 10 Units (  10 Units Intravenous Given 10/15/20 1756)  calcium gluconate in NaCl 1-0.675 GM/50ML-% IVPB (  Stopped 10/15/20 1907)  sodium zirconium cyclosilicate (LOKELMA) packet 20 g (20 g Oral Given 10/15/20 2017)  furosemide (LASIX) 120 mg in dextrose 5 % 50 mL IVPB (0 mg Intravenous  Stopped 10/15/20 2206)     Procedures  /  Critical Care .Critical Care Performed by: Sabas Sous, MD Authorized by: Sabas Sous, MD   Critical care provider statement:    Critical care time (minutes):  45   Critical care was necessary to treat or prevent imminent or life-threatening deterioration of the following conditions:  Metabolic crisis (Critical hyperkalemia)   Critical care was time spent personally by me on the following activities:  Discussions with consultants, evaluation of patient's response to treatment, examination of patient, ordering and performing treatments and interventions, ordering and review of laboratory studies, ordering and review of radiographic studies, pulse oximetry, re-evaluation of patient's condition, obtaining history from patient or surrogate and review of old charts    ED Course and Medical Decision Making  I have reviewed the triage vital signs, the nursing notes, and pertinent available records from the EMR.  Listed above are laboratory and imaging tests that I personally ordered, reviewed, and interpreted and then considered in my medical decision making (see below for details).  Patient arriving obtunded, evident peak T waves on rhythm strip, prolonged QRS and no history of such. Glucose over 500, suspect DKA. No history of ESRD. Protecting airway, IV access achieved rapidly, given emergent calcium gluconate as a rapid push. I-STAT labs confirm a potassium of greater than 8.5. Given amp bicarb, albuterol, bolus 10 units insulin. Started on insulin drip. Patient's initial blood pressures are hypotensive 80s over 50s, improving with fluids. Discussed case with Dr. Valentino Nose of nephrology, recommending standard management of hyperkalemia and for DKA and recheck of the potassium in 2 hours as these patients typically improve dramatically on the insulin drip and do not need emergent dialysis. Patient is clinically improving significantly, more alert and  oriented, awaiting repeat potassium. Suspect admission to stepdown versus ICU.     Accepted for admission to stepdown unit for further care.  Elmer Sow. Pilar Plate, MD [facility] Emergency Medicine [facility] [email or web address]  Final Clinical Impressions(s) / ED Diagnoses     ICD-10-CM   1. Hyperkalemia  E87.5   2. Diabetic ketoacidosis without coma associated with other specified diabetes mellitus (HCC)  E13.10     ED Discharge Orders    None       Discharge Instructions Discussed with and Provided to Patient:   Discharge Instructions   None       Sabas Sous, MD 10/15/20 2345

## 2020-10-16 ENCOUNTER — Inpatient Hospital Stay (HOSPITAL_COMMUNITY): Payer: Medicaid Other

## 2020-10-16 DIAGNOSIS — G9341 Metabolic encephalopathy: Secondary | ICD-10-CM | POA: Diagnosis not present

## 2020-10-16 DIAGNOSIS — K7689 Other specified diseases of liver: Secondary | ICD-10-CM | POA: Diagnosis not present

## 2020-10-16 DIAGNOSIS — E1310 Other specified diabetes mellitus with ketoacidosis without coma: Secondary | ICD-10-CM | POA: Diagnosis not present

## 2020-10-16 DIAGNOSIS — D259 Leiomyoma of uterus, unspecified: Secondary | ICD-10-CM | POA: Diagnosis not present

## 2020-10-16 DIAGNOSIS — N179 Acute kidney failure, unspecified: Secondary | ICD-10-CM | POA: Diagnosis not present

## 2020-10-16 DIAGNOSIS — R109 Unspecified abdominal pain: Secondary | ICD-10-CM | POA: Diagnosis not present

## 2020-10-16 DIAGNOSIS — E1110 Type 2 diabetes mellitus with ketoacidosis without coma: Secondary | ICD-10-CM | POA: Diagnosis not present

## 2020-10-16 LAB — CBC
HCT: 29.4 % — ABNORMAL LOW (ref 36.0–46.0)
Hemoglobin: 9.2 g/dL — ABNORMAL LOW (ref 12.0–15.0)
MCH: 22 pg — ABNORMAL LOW (ref 26.0–34.0)
MCHC: 31.3 g/dL (ref 30.0–36.0)
MCV: 70.2 fL — ABNORMAL LOW (ref 80.0–100.0)
Platelets: 294 10*3/uL (ref 150–400)
RBC: 4.19 MIL/uL (ref 3.87–5.11)
RDW: 23.3 % — ABNORMAL HIGH (ref 11.5–15.5)
WBC: 14.9 10*3/uL — ABNORMAL HIGH (ref 4.0–10.5)
nRBC: 0 % (ref 0.0–0.2)

## 2020-10-16 LAB — GLUCOSE, CAPILLARY
Glucose-Capillary: 212 mg/dL — ABNORMAL HIGH (ref 70–99)
Glucose-Capillary: 231 mg/dL — ABNORMAL HIGH (ref 70–99)
Glucose-Capillary: 191 mg/dL — ABNORMAL HIGH (ref 70–99)
Glucose-Capillary: 218 mg/dL — ABNORMAL HIGH (ref 70–99)
Glucose-Capillary: 169 mg/dL — ABNORMAL HIGH (ref 70–99)
Glucose-Capillary: 156 mg/dL — ABNORMAL HIGH (ref 70–99)
Glucose-Capillary: 164 mg/dL — ABNORMAL HIGH (ref 70–99)
Glucose-Capillary: 187 mg/dL — ABNORMAL HIGH (ref 70–99)
Glucose-Capillary: 187 mg/dL — ABNORMAL HIGH (ref 70–99)
Glucose-Capillary: 198 mg/dL — ABNORMAL HIGH (ref 70–99)
Glucose-Capillary: 231 mg/dL — ABNORMAL HIGH (ref 70–99)
Glucose-Capillary: 279 mg/dL — ABNORMAL HIGH (ref 70–99)

## 2020-10-16 LAB — BETA-HYDROXYBUTYRIC ACID
Beta-Hydroxybutyric Acid: 0.16 mmol/L (ref 0.05–0.27)
Beta-Hydroxybutyric Acid: 0.08 mmol/L (ref 0.05–0.27)

## 2020-10-16 LAB — BASIC METABOLIC PANEL
Anion gap: 14 (ref 5–15)
Anion gap: 13 (ref 5–15)
Anion gap: 13 (ref 5–15)
Anion gap: 10 (ref 5–15)
BUN: 18 mg/dL (ref 6–20)
BUN: 16 mg/dL (ref 6–20)
BUN: 15 mg/dL (ref 6–20)
BUN: 15 mg/dL (ref 6–20)
CO2: 20 mmol/L — ABNORMAL LOW (ref 22–32)
CO2: 22 mmol/L (ref 22–32)
CO2: 21 mmol/L — ABNORMAL LOW (ref 22–32)
CO2: 22 mmol/L (ref 22–32)
Calcium: 9.5 mg/dL (ref 8.9–10.3)
Calcium: 9.6 mg/dL (ref 8.9–10.3)
Calcium: 9.8 mg/dL (ref 8.9–10.3)
Calcium: 9.3 mg/dL (ref 8.9–10.3)
Chloride: 101 mmol/L (ref 98–111)
Chloride: 103 mmol/L (ref 98–111)
Chloride: 103 mmol/L (ref 98–111)
Chloride: 104 mmol/L (ref 98–111)
Creatinine, Ser: 1.77 mg/dL — ABNORMAL HIGH (ref 0.44–1.00)
Creatinine, Ser: 1.62 mg/dL — ABNORMAL HIGH (ref 0.44–1.00)
Creatinine, Ser: 1.56 mg/dL — ABNORMAL HIGH (ref 0.44–1.00)
Creatinine, Ser: 1.52 mg/dL — ABNORMAL HIGH (ref 0.44–1.00)
GFR, Estimated: 36 mL/min — ABNORMAL LOW (ref >60)
GFR, Estimated: 40 mL/min — ABNORMAL LOW (ref >60)
GFR, Estimated: 42 mL/min — ABNORMAL LOW (ref >60)
GFR, Estimated: 43 mL/min — ABNORMAL LOW (ref >60)
Glucose, Bld: 223 mg/dL — ABNORMAL HIGH (ref 70–99)
Glucose, Bld: 152 mg/dL — ABNORMAL HIGH (ref 70–99)
Glucose, Bld: 178 mg/dL — ABNORMAL HIGH (ref 70–99)
Glucose, Bld: 228 mg/dL — ABNORMAL HIGH (ref 70–99)
Potassium: 4.6 mmol/L (ref 3.5–5.1)
Potassium: 3.9 mmol/L (ref 3.5–5.1)
Potassium: 4.3 mmol/L (ref 3.5–5.1)
Potassium: 4.2 mmol/L (ref 3.5–5.1)
Sodium: 135 mmol/L (ref 135–145)
Sodium: 138 mmol/L (ref 135–145)
Sodium: 137 mmol/L (ref 135–145)
Sodium: 136 mmol/L (ref 135–145)

## 2020-10-16 LAB — PROCALCITONIN: Procalcitonin: 0.16 ng/mL

## 2020-10-16 LAB — LACTIC ACID, PLASMA: Lactic Acid, Venous: 2.3 mmol/L (ref 0.5–1.9)

## 2020-10-16 LAB — CBG MONITORING, ED
Glucose-Capillary: 202 mg/dL — ABNORMAL HIGH (ref 70–99)
Glucose-Capillary: 226 mg/dL — ABNORMAL HIGH (ref 70–99)

## 2020-10-16 LAB — HEMOGLOBIN A1C
Hgb A1c MFr Bld: 9.5 % — ABNORMAL HIGH (ref 4.8–5.6)
Mean Plasma Glucose: 225.95 mg/dL

## 2020-10-16 MED ORDER — METOPROLOL SUCCINATE ER 100 MG PO TB24
200.0000 mg | ORAL_TABLET | Freq: Every day | ORAL | Status: DC
  Administered 2020-10-16 – 2020-10-17 (×2): 200 mg via ORAL
  Filled 2020-10-16 (×3): qty 2

## 2020-10-16 MED ORDER — CYCLOBENZAPRINE HCL 10 MG PO TABS
5.0000 mg | ORAL_TABLET | Freq: Three times a day (TID) | ORAL | Status: DC | PRN
  Administered 2020-10-16: 5 mg via ORAL
  Filled 2020-10-16: qty 1

## 2020-10-16 MED ORDER — PANTOPRAZOLE SODIUM 40 MG PO TBEC
40.0000 mg | DELAYED_RELEASE_TABLET | Freq: Every day | ORAL | Status: DC
  Administered 2020-10-16 – 2020-10-17 (×2): 40 mg via ORAL
  Filled 2020-10-16 (×2): qty 1

## 2020-10-16 MED ORDER — PIPERACILLIN-TAZOBACTAM 3.375 G IVPB
3.3750 g | Freq: Three times a day (TID) | INTRAVENOUS | Status: DC
  Administered 2020-10-16 (×2): 3.375 g via INTRAVENOUS
  Filled 2020-10-16 (×2): qty 50

## 2020-10-16 MED ORDER — METOCLOPRAMIDE HCL 10 MG PO TABS: 10.0000 mg | ORAL_TABLET | Freq: Three times a day (TID) | ORAL | Status: DC

## 2020-10-16 MED ORDER — SPIRONOLACTONE 25 MG PO TABS
50.0000 mg | ORAL_TABLET | Freq: Every day | ORAL | Status: DC
  Administered 2020-10-16 – 2020-10-17 (×2): 50 mg via ORAL
  Filled 2020-10-16 (×2): qty 2

## 2020-10-16 MED ORDER — INSULIN ASPART 100 UNIT/ML ~~LOC~~ SOLN
0.0000 [IU] | Freq: Every day | SUBCUTANEOUS | Status: DC
  Administered 2020-10-16: 3 [IU] via SUBCUTANEOUS

## 2020-10-16 MED ORDER — LOSARTAN POTASSIUM 50 MG PO TABS
50.0000 mg | ORAL_TABLET | Freq: Every day | ORAL | Status: DC
  Administered 2020-10-16 – 2020-10-17 (×2): 50 mg via ORAL
  Filled 2020-10-16 (×2): qty 1

## 2020-10-16 MED ORDER — ENOXAPARIN SODIUM 60 MG/0.6ML ~~LOC~~ SOLN
60.0000 mg | Freq: Every day | SUBCUTANEOUS | Status: DC
  Administered 2020-10-17: 60 mg via SUBCUTANEOUS
  Filled 2020-10-16: qty 0.6

## 2020-10-16 MED ORDER — HYDRALAZINE HCL 20 MG/ML IJ SOLN: 10.0000 mg | INTRAMUSCULAR | Status: DC | PRN

## 2020-10-16 MED ORDER — VANCOMYCIN HCL 2000 MG/400ML IV SOLN
2000.0000 mg | Freq: Once | INTRAVENOUS | Status: AC
  Administered 2020-10-16: 2000 mg via INTRAVENOUS
  Filled 2020-10-16: qty 400

## 2020-10-16 MED ORDER — FUROSEMIDE 40 MG PO TABS
40.0000 mg | ORAL_TABLET | Freq: Two times a day (BID) | ORAL | Status: DC
  Administered 2020-10-17: 40 mg via ORAL
  Filled 2020-10-16: qty 1

## 2020-10-16 MED ORDER — INSULIN ASPART 100 UNIT/ML ~~LOC~~ SOLN
8.0000 [IU] | Freq: Three times a day (TID) | SUBCUTANEOUS | Status: DC
  Administered 2020-10-16 (×2): 8 [IU] via SUBCUTANEOUS

## 2020-10-16 MED ORDER — FUROSEMIDE 40 MG PO TABS: 40.0000 mg | ORAL_TABLET | Freq: Two times a day (BID) | ORAL | Status: DC

## 2020-10-16 MED ORDER — VANCOMYCIN HCL 1500 MG/300ML IV SOLN: 1500.0000 mg | INTRAVENOUS | Status: DC

## 2020-10-16 MED ORDER — METOCLOPRAMIDE HCL 5 MG/ML IJ SOLN
10.0000 mg | Freq: Four times a day (QID) | INTRAMUSCULAR | Status: DC
  Administered 2020-10-16 – 2020-10-17 (×4): 10 mg via INTRAVENOUS
  Filled 2020-10-16 (×4): qty 2

## 2020-10-16 MED ORDER — INSULIN ASPART 100 UNIT/ML ~~LOC~~ SOLN
0.0000 [IU] | Freq: Three times a day (TID) | SUBCUTANEOUS | Status: DC
  Administered 2020-10-16: 4 [IU] via SUBCUTANEOUS
  Administered 2020-10-16: 7 [IU] via SUBCUTANEOUS
  Administered 2020-10-17 (×2): 11 [IU] via SUBCUTANEOUS

## 2020-10-16 MED ORDER — NICOTINE 14 MG/24HR TD PT24
14.0000 mg | MEDICATED_PATCH | Freq: Every day | TRANSDERMAL | Status: DC
  Administered 2020-10-16 – 2020-10-17 (×2): 14 mg via TRANSDERMAL
  Filled 2020-10-16 (×2): qty 1

## 2020-10-16 MED ORDER — INSULIN GLARGINE 100 UNIT/ML ~~LOC~~ SOLN
50.0000 [IU] | Freq: Every day | SUBCUTANEOUS | Status: DC
  Administered 2020-10-16: 50 [IU] via SUBCUTANEOUS
  Filled 2020-10-16 (×3): qty 0.5

## 2020-10-16 NOTE — Progress Notes (Signed)
Pt. Arrived from ED. VSS, Insulin gtt infusing with FSBS-208. GCS-15,CCMD notified with NSR on monitor. Orders given and carried out. Will continue to monitor closely

## 2020-10-16 NOTE — Progress Notes (Signed)
PROGRESS NOTE    Jill Shaw  UXL:[id number] DOB: 1976/07/31 DOA: 10/15/2020 PCP: Barbette Merino, NP    Brief Narrative:  45 y.o. female with medical history significant of insulin-dependent type 2 diabetes, CKD stage IIIa, hypertension, chronic diastolic CHF, asthma, COPD, GERD, morbid obesity (BMI 45.15), chronic microcytic anemia, uterine dysfunction, recurrent syncope, history of seizures presented to the ED with DKA  Assessment & Plan:   Principal Problem:   DKA (diabetic ketoacidosis) (HCC) Active Problems:   AKI (acute kidney injury) (HCC)   Hyperkalemia   Severe sepsis (HCC)   Acute metabolic encephalopathy    DKA in the setting of poorly controlled insulin-dependent type 2 diabetes:  -Suspicion for dietary indiscretion/increased carbohydrate intake in the setting of holidays.  A1c 9.2 on 07/19/2020.   -Pt presented with blood glucose above 500.  Bicarb 14, anion gap 17, UA with ketones, and beta hydroxybutyric acid 3.73.  VBG with pH 7.30.   -DKA physiology has resolved with IVF hydration and insulin gtt -Have transitioned pt to lantus 50 units daily with 8 units meal coverage per Diabetic Coordinator recs -A1c is currently 9.5 -repeat bmet in AM -Have consulted dietitian for nutritional   AKI on CKD stage IIIa:  -Likely prerenal from dehydration in the setting of of severe hyperglycemia/DKA.  In addition, home diuretic, ARB, and NSAID use also likely contributing.   -BUN 22, creatinine 2.2.  Baseline creatinine is around 1.2. Renal function improved with hydration -Pt received IV fluid hydration.  Monitor renal function and urine output.   -Initially held spironolactone, Lasix, losartan, and Mobic Given very poorly controlled BP in the setting of improved renal function, have cautiously resumed home bp meds -Recheck renal panel in AM  Severe hyperkalemia with EKG changes: - Potassium >7.5.  Initial EKG showing peaked T waves in widened QRS complexes.  -After  discussion with nephrology, patient was given medical treatment for hyperkalemia including calcium gluconate, albuterol nebulizer treatment, insulin/dextrose, Lokelma, and IV Lasix 120 mg.  -Potassium has since normalized -Recheck lytes in AM  Sepsis ruled out -Afebrile. Tachycardic and tachypneic in the setting of DKA.  -No evidence of acute infection -CT abd unremarkable, reviewed -Procalcitonin is low at 0.16 -Will hold further abx  Acute metabolic encephalopathy:  -Likely due to DKA.  Patient was obtunded at the time of EMS arrival but has now improved significantly after treatment for DKA.   -remains fully awake and alert, answering questions.  Neuro exam nonfocal. -Continue to monitor  Pseudohyponatremia:  -Due to severe hyperglycemia -Now normalized  Hypertension -Now poorly controlled following IVF -Have resumed home BP meds  Hyperlipidemia -Continue home Crestor  Chronic diastolic CHF:  -Initially held diuretics -Following IVF hydration overnight, have resumed lasix  Asthma/COPD: Stable.  No signs of acute exacerbation at this time. -Continue home inhalers  Chronic microcytic anemia: Stable.  Hemoglobin 11.0, at baseline. -Takes an iron supplement at home, continue.  DVT prophylaxis: Lovenox subq Code Status: Full Family Communication: Pt in room, family not at bedside  Status is: Inpatient  Remains inpatient appropriate because:Persistent severe electrolyte disturbances and Inpatient level of care appropriate due to severity of illness   Dispo: The patient is from: Home              Anticipated d/c is to: Home              Anticipated d/c date is: 2 days              Patient currently  is medically stable to d/c.       Consultants:     Procedures:     Antimicrobials: Anti-infectives (From admission, onward)   Start     Dose/Rate Route Frequency Ordered Stop   10/17/20 0600  vancomycin (VANCOREADY) IVPB 1500 mg/300 mL        1,500  mg 150 mL/hr over 120 Minutes Intravenous Every 24 hours 10/16/20 0015     10/16/20 0200  piperacillin-tazobactam (ZOSYN) IVPB 3.375 g        3.375 g 12.5 mL/hr over 240 Minutes Intravenous Every 8 hours 10/16/20 0015     10/16/20 0030  vancomycin (VANCOREADY) IVPB 2000 mg/400 mL        2,000 mg 200 mL/hr over 120 Minutes Intravenous  Once 10/16/20 0015 10/16/20 0345       Subjective: Reports feeling better this AM  Objective: Vitals:   10/16/20 0155 10/16/20 0750 10/16/20 0812 10/16/20 1200  BP: (!) 165/78 (!) 145/76  (!) 115/57  Pulse: 100 90  100  Resp: 20 18  20   Temp: 98.7 F (37.1 C) 99.1 F (37.3 C)  98.9 F (37.2 C)  TempSrc: Oral Oral  Oral  SpO2: 100% 99% 97% 100%  Weight:      Height:        Intake/Output Summary (Last 24 hours) at 10/16/2020 1449 Last data filed at 10/16/2020 1319 Gross per 24 hour  Intake 2235 ml  Output 2300 ml  Net -65 ml   Filed Weights   10/15/20 1744  Weight: 119.3 kg    Examination:  General exam: Appears calm and comfortable  Respiratory system: Clear to auscultation. Respiratory effort normal. Cardiovascular system: S1 & S2 heard, Regular Gastrointestinal system: Abdomen is nondistended, soft and nontender. No organomegaly or masses felt. Normal bowel sounds heard. Central nervous system: Alert and oriented. No focal neurological deficits. Extremities: Symmetric 5 x 5 power. Skin: No rashes, lesions  Psychiatry: Judgement and insight appear normal. Mood & affect appropriate.   Data Reviewed: I have personally reviewed following labs and imaging studies  CBC: Recent Labs  Lab 10/15/20 1742 10/15/20 1750 10/16/20 0346  WBC 19.2*  --  14.9*  HGB 11.0* 13.6 9.2*  HCT 37.4 40.0 29.4*  MCV 72.5*  --  70.2*  PLT 341  --  294   Basic Metabolic Panel: Recent Labs  Lab 10/15/20 1742 10/15/20 1750 10/15/20 2029 10/16/20 0346 10/16/20 0730 10/16/20 1148  NA 123* 123* 133* 135 138 137  K >7.5* >8.5* 5.3* 4.6 3.9 4.3   CL 92*  --  101 101 103 103  CO2 14*  --  15* 20* 22 21*  GLUCOSE 577*  --  371* 223* 152* 178*  BUN 22*  --  20 18 16 15   CREATININE 2.27*  --  1.95* 1.77* 1.62* 1.56*  CALCIUM 10.0  --  10.0 9.5 9.6 9.8   GFR: Estimated Creatinine Clearance: 59.1 mL/min (A) (by C-G formula based on SCr of 1.56 mg/dL (H)). Liver Function Tests: Recent Labs  Lab 10/15/20 1742  AST 55*  ALT 21  ALKPHOS 91  BILITOT 1.3*  PROT 8.2*  ALBUMIN 4.0   No results for input(s): LIPASE, AMYLASE in the last 168 hours. No results for input(s): AMMONIA in the last 168 hours. Coagulation Profile: No results for input(s): INR, PROTIME in the last 168 hours. Cardiac Enzymes: No results for input(s): CKTOTAL, CKMB, CKMBINDEX, TROPONINI in the last 168 hours. BNP (last 3 results) Recent Labs  07/12/20 1649  PROBNP 57.0   HbA1C: Recent Labs    10/16/20 0730  HGBA1C 9.5*   CBG: Recent Labs  Lab 10/16/20 0823 10/16/20 0920 10/16/20 1026 10/16/20 1127 10/16/20 1229  GLUCAP 156* 164* 187* 187* 198*   Lipid Profile: No results for input(s): CHOL, HDL, LDLCALC, TRIG, CHOLHDL, LDLDIRECT in the last 72 hours. Thyroid Function Tests: No results for input(s): TSH, T4TOTAL, FREET4, T3FREE, THYROIDAB in the last 72 hours. Anemia Panel: No results for input(s): VITAMINB12, FOLATE, FERRITIN, TIBC, IRON, RETICCTPCT in the last 72 hours. Sepsis Labs: Recent Labs  Lab 10/15/20 1742 10/15/20 2103 10/16/20 0334  PROCALCITON  --   --  0.16  LATICACIDVEN 3.3* 4.1* 2.3*    Recent Results (from the past 240 hour(s))  Resp Panel by RT-PCR (Flu A&B, Covid) Nasopharyngeal Swab     Status: None   Collection Time: 10/15/20  5:58 PM   Specimen: Nasopharyngeal Swab; Nasopharyngeal(NP) swabs in vial transport medium  Result Value Ref Range Status   SARS Coronavirus 2 by RT PCR NEGATIVE NEGATIVE Final    Comment: (NOTE) SARS-CoV-2 target nucleic acids are NOT DETECTED.  The SARS-CoV-2 RNA is generally  detectable in upper respiratory specimens during the acute phase of infection. The lowest concentration of SARS-CoV-2 viral copies this assay can detect is 138 copies/mL. A negative result does not preclude SARS-Cov-2 infection and should not be used as the sole basis for treatment or other patient management decisions. A negative result may occur with  improper specimen collection/handling, submission of specimen other than nasopharyngeal swab, presence of viral mutation(s) within the areas targeted by this assay, and inadequate number of viral copies(<138 copies/mL). A negative result must be combined with clinical observations, patient history, and epidemiological information. The expected result is Negative.  Fact Sheet for Patients:  BloggerCourse.com  Fact Sheet for Healthcare Providers:  SeriousBroker.it  This test is no t yet approved or cleared by the [place] FDA and  has been authorized for detection and/or diagnosis of SARS-CoV-2 by FDA under an Emergency Use Authorization (EUA). This EUA will remain  in effect (meaning this test can be used) for the duration of the COVID-19 declaration under Section 564(b)(1) of the Act, 21 U.S.C.section 360bbb-3(b)(1), unless the authorization is terminated  or revoked sooner.       Influenza A by PCR NEGATIVE NEGATIVE Final   Influenza B by PCR NEGATIVE NEGATIVE Final    Comment: (NOTE) The Xpert Xpress SARS-CoV-2/FLU/RSV plus assay is intended as an aid in the diagnosis of influenza from Nasopharyngeal swab specimens and should not be used as a sole basis for treatment. Nasal washings and aspirates are unacceptable for Xpert Xpress SARS-CoV-2/FLU/RSV testing.  Fact Sheet for Patients: BloggerCourse.com  Fact Sheet for Healthcare Providers: SeriousBroker.it  This test is not yet approved or cleared by the [place] FDA  and has been authorized for detection and/or diagnosis of SARS-CoV-2 by FDA under an Emergency Use Authorization (EUA). This EUA will remain in effect (meaning this test can be used) for the duration of the COVID-19 declaration under Section 564(b)(1) of the Act, 21 U.S.C. section 360bbb-3(b)(1), unless the authorization is terminated or revoked.  Performed at [facility], [address], [place], [place] [address]   Culture, blood (routine x 2)     Status: None (Preliminary result)   Collection Time: 10/16/20  3:34 AM   Specimen: BLOOD LEFT ARM  Result Value Ref Range Status   Specimen Description BLOOD LEFT ARM  Final   Special Requests   Final    BOTTLES DRAWN AEROBIC AND ANAEROBIC Blood Culture adequate volume   Culture   Final    NO GROWTH < 12 HOURS Performed at [facility], [address], [place], [place] [address]    Report Status PENDING  Incomplete  Culture, blood (routine x 2)     Status: None (Preliminary result)   Collection Time: 10/16/20  6:11 AM   Specimen: BLOOD LEFT ARM  Result Value Ref Range Status   Specimen Description BLOOD LEFT ARM  Final   Special Requests   Final    BOTTLES DRAWN AEROBIC ONLY Blood Culture adequate volume   Culture   Final    NO GROWTH < 12 HOURS Performed at [facility], [place] [address] [place], [place] [address]    Report Status PENDING  Incomplete     Radiology Studies: CT ABDOMEN PELVIS WO CONTRAST  Result Date: 10/16/2020 CLINICAL DATA:  Nonlocalized abdominal pain EXAM: CT ABDOMEN AND PELVIS WITHOUT CONTRAST TECHNIQUE: Multidetector CT imaging of the abdomen and pelvis was performed following the standard protocol without IV contrast. COMPARISON:  None. FINDINGS: Lower chest: The visualized heart size within normal limits. No pericardial fluid/thickening. No hiatal hernia. The visualized portions of the lungs are clear. Hepatobiliary: Again noted is heterogeneously hypodense area is seen throughout the liver  parenchyma there is a rounded masslike hypodense area again noted within the inferior right liver lobe which appears unchanged since the prior exam dating back to January 31, 2020. No evidence of calcified gallstones or biliary ductal dilatation. Pancreas:  Unremarkable.  No surrounding inflammatory changes. Spleen: Normal in size. Although limited due to the lack of intravenous contrast, normal in appearance. Adrenals/Urinary Tract: Both adrenal glands appear normal. The kidneys and collecting system appear normal without evidence of urinary tract calculus or hydronephrosis. Bladder is unremarkable. Stomach/Bowel: The stomach, small bowel, and colon are normal in appearance. No inflammatory changes or obstructive findings. appendix is normal. Vascular/Lymphatic: There are no enlarged abdominal or pelvic lymph nodes. No significant gross vascular findings are present given the lack of intravenous contrast. Reproductive: Again noted is somewhat lobular appearance to the posterior fundus which could represent uterine fibroid. Other: No evidence of abdominal wall mass or hernia. Musculoskeletal: No acute or significant osseous findings. IMPRESSION: Scattered heterogeneously hypodense masslike area seen throughout the liver dating back to April 2021 could represent geographic fatty infiltration. However if further evaluation is required would recommend non emergent MRI. No other acute intra-abdominal or pelvic pathology to explain the patient's symptoms probable posterior fundal uterine fibroids. Electronically Signed   By: Prudencio Pair M.D.   On: 10/16/2020 01:42   DG Chest Port 1 View  Result Date: 10/15/2020 CLINICAL DATA:  Altered mental status EXAM: PORTABLE CHEST 1 VIEW COMPARISON:  07/02/2020 FINDINGS: Shallow inspiration. Cardiac enlargement. Linear atelectasis or fibrosis in the mid lungs. No airspace disease or consolidation. No pleural effusions. No pneumothorax. IMPRESSION: Cardiac enlargement. Linear  atelectasis or fibrosis in the mid lungs. Electronically Signed   By: Lucienne Capers M.D.   On: 10/15/2020 18:11    Scheduled Meds: . amitriptyline  75 mg Oral QHS  . buPROPion  150 mg Oral BID WC  . [START ON 10/17/2020] enoxaparin (LOVENOX) injection  60 mg Subcutaneous Daily  . ferrous sulfate  325 mg Oral Q breakfast  . fluticasone  2 spray Each Nare Daily  . furosemide  40 mg Oral BID  . insulin aspart  0-20 Units Subcutaneous TID WC  . insulin aspart  0-5 Units Subcutaneous QHS  . insulin aspart  8 Units Subcutaneous TID WC  . insulin glargine  50 Units Subcutaneous Daily  . loratadine  10 mg Oral Daily  . losartan  50 mg Oral Daily  . metoCLOPramide  10 mg Oral TID AC & HS  . metoprolol  200 mg Oral Daily  . mometasone-formoterol  2 puff Inhalation BID  . pantoprazole  40 mg Oral Daily  . rosuvastatin  5 mg Oral Daily  . spironolactone  50 mg Oral Daily  . sucralfate  1 g Oral TID WC & HS  . umeclidinium bromide  1 puff Inhalation Daily   Continuous Infusions: . sodium chloride Stopped (10/16/20 0016)  . insulin Stopped (10/16/20 1348)  . piperacillin-tazobactam (ZOSYN)  IV 3.375 g (10/16/20 1053)  . [START ON 10/17/2020] vancomycin       LOS: 1 day   Rickey Barbara, MD [facility] Pager On Amion  If 7PM-7AM, please contact night-coverage 10/16/2020, 2:49 PM

## 2020-10-16 NOTE — Progress Notes (Signed)
Pharmacy Antibiotic Note  Jill Shaw is a 45 y.o. female admitted on 10/15/2020 with AMS/DKA, possible sepsis .  Pharmacy has been consulted for Vancomycin and Zosyn dosing.  Plan: Vancomycin 2000 mg IV now, then 1500 mg IV q24h Zosyn 3.375 g IV q8h   Height: 5\' 4"  (162.6 cm) Weight: 119.3 kg (263 lb 0.1 oz) IBW/kg (Calculated) : 54.7  Temp (24hrs), Avg:99.1 F (37.3 C), Min:99.1 F (37.3 C), Max:99.1 F (37.3 C)  Recent Labs  Lab 10/15/20 1742 10/15/20 2029 10/15/20 2103  WBC 19.2*  --   --   CREATININE 2.27* 1.95*  --   LATICACIDVEN 3.3*  --  4.1*    Estimated Creatinine Clearance: 47.3 mL/min (A) (by C-G formula based on SCr of 1.95 mg/dL (H)).    Allergies  Allergen Reactions  . Ketoprofen Nausea And Vomiting  . Aspirin Nausea Only  . Gabapentin Nausea And Vomiting and Other (See Comments)    upset stomach  . Ibuprofen Nausea And Vomiting  . Liraglutide Nausea And Vomiting  . Naproxen Nausea And Vomiting  . Omeprazole-Sodium Bicarbonate Nausea And Vomiting  . Sulfa Antibiotics Nausea And Vomiting  . Tramadol Nausea And Vomiting and Other (See Comments)    stomach upset     2104 10/16/2020 12:10 AM

## 2020-10-16 NOTE — Progress Notes (Addendum)
Inpatient Diabetes Program Recommendations  AACE/ADA: New Consensus Statement on Inpatient Glycemic Control (2015)  Target Ranges:  Prepandial:   less than 140 mg/dL      Peak postprandial:   less than 180 mg/dL (1-2 hours)      Critically ill patients:  140 - 180 mg/dL   Lab Results  Component Value Date   GLUCAP 187 (H) 10/16/2020   HGBA1C 9.5 (H) 10/16/2020    Review of Glycemic Control Results for Jill Shaw, Jill Shaw (MRN [id number]) as of 10/16/2020 10:34  Ref. Range 10/16/2020 08:23 10/16/2020 09:20 10/16/2020 10:26  Glucose-Capillary Latest Ref Range: 70 - 99 mg/dL 154 (H) 008 (H) 676 (H)   Diabetes history: Type 2 DM Outpatient Diabetes medications: Humalog 75/25 110 units BID, Onglyza 2.5 mg QD Current orders for Inpatient glycemic control: IV insulin  Inpatient Diabetes Program Recommendations:    When ready for transition consider: -Lantus 50 units two hours prior to discontinuation, then QD to follow -Novolog 0-20 units TID & HS -Novolog 8 units TID (assuming patient is consuming >50% of meal) Attempted to reach patient, as DM coordinator working remotely covering all campuses. Will reattempt.  Thanks, Lujean Rave, MSN, RNC-OB Diabetes Coordinator [phone] (8a-5p)

## 2020-10-17 ENCOUNTER — Other Ambulatory Visit: Payer: Self-pay

## 2020-10-17 ENCOUNTER — Encounter (HOSPITAL_COMMUNITY): Payer: Self-pay | Admitting: Internal Medicine

## 2020-10-17 DIAGNOSIS — E1110 Type 2 diabetes mellitus with ketoacidosis without coma: Secondary | ICD-10-CM | POA: Diagnosis not present

## 2020-10-17 DIAGNOSIS — N179 Acute kidney failure, unspecified: Secondary | ICD-10-CM | POA: Diagnosis not present

## 2020-10-17 DIAGNOSIS — E1310 Other specified diabetes mellitus with ketoacidosis without coma: Secondary | ICD-10-CM | POA: Diagnosis not present

## 2020-10-17 DIAGNOSIS — G9341 Metabolic encephalopathy: Secondary | ICD-10-CM | POA: Diagnosis not present

## 2020-10-17 LAB — GLUCOSE, CAPILLARY
Glucose-Capillary: 282 mg/dL — ABNORMAL HIGH (ref 70–99)
Glucose-Capillary: 264 mg/dL — ABNORMAL HIGH (ref 70–99)

## 2020-10-17 LAB — MAGNESIUM: Magnesium: 2 mg/dL (ref 1.7–2.4)

## 2020-10-17 LAB — COMPREHENSIVE METABOLIC PANEL
ALT: 17 U/L (ref 0–44)
AST: 36 U/L (ref 15–41)
Albumin: 3.1 g/dL — ABNORMAL LOW (ref 3.5–5.0)
Alkaline Phosphatase: 67 U/L (ref 38–126)
Anion gap: 13 (ref 5–15)
BUN: 14 mg/dL (ref 6–20)
CO2: 19 mmol/L — ABNORMAL LOW (ref 22–32)
Calcium: 9.1 mg/dL (ref 8.9–10.3)
Chloride: 103 mmol/L (ref 98–111)
Creatinine, Ser: 1.46 mg/dL — ABNORMAL HIGH (ref 0.44–1.00)
GFR, Estimated: 46 mL/min — ABNORMAL LOW (ref >60)
Glucose, Bld: 299 mg/dL — ABNORMAL HIGH (ref 70–99)
Potassium: 4.4 mmol/L (ref 3.5–5.1)
Sodium: 135 mmol/L (ref 135–145)
Total Bilirubin: 0.6 mg/dL (ref 0.3–1.2)
Total Protein: 6.1 g/dL — ABNORMAL LOW (ref 6.5–8.1)

## 2020-10-17 LAB — CBC
HCT: 30.7 % — ABNORMAL LOW (ref 36.0–46.0)
Hemoglobin: 9 g/dL — ABNORMAL LOW (ref 12.0–15.0)
MCH: 21.4 pg — ABNORMAL LOW (ref 26.0–34.0)
MCHC: 29.3 g/dL — ABNORMAL LOW (ref 30.0–36.0)
MCV: 72.9 fL — ABNORMAL LOW (ref 80.0–100.0)
Platelets: 267 10*3/uL (ref 150–400)
RBC: 4.21 MIL/uL (ref 3.87–5.11)
RDW: 23.4 % — ABNORMAL HIGH (ref 11.5–15.5)
WBC: 9.5 10*3/uL (ref 4.0–10.5)
nRBC: 0 % (ref 0.0–0.2)

## 2020-10-17 MED ORDER — INSULIN ASPART 100 UNIT/ML ~~LOC~~ SOLN
10.0000 [IU] | Freq: Three times a day (TID) | SUBCUTANEOUS | Status: DC
  Administered 2020-10-17 (×2): 10 [IU] via SUBCUTANEOUS

## 2020-10-17 MED ORDER — INSULIN GLARGINE 100 UNIT/ML ~~LOC~~ SOLN
55.0000 [IU] | Freq: Every day | SUBCUTANEOUS | Status: DC
  Administered 2020-10-17: 55 [IU] via SUBCUTANEOUS
  Filled 2020-10-17: qty 0.55

## 2020-10-17 MED ORDER — LIVING WELL WITH DIABETES BOOK
Freq: Once | Status: AC
  Filled 2020-10-17: qty 1

## 2020-10-17 MED ORDER — OXYCODONE HCL 5 MG PO TABS
5.0000 mg | ORAL_TABLET | Freq: Four times a day (QID) | ORAL | Status: DC | PRN
  Administered 2020-10-17: 10 mg via ORAL
  Filled 2020-10-17: qty 2

## 2020-10-17 MED ORDER — ONDANSETRON HCL 4 MG/2ML IJ SOLN: 4.0000 mg | Freq: Four times a day (QID) | INTRAMUSCULAR | Status: DC | PRN

## 2020-10-17 MED ORDER — METOCLOPRAMIDE HCL 10 MG PO TABS: 10.0000 mg | ORAL_TABLET | Freq: Three times a day (TID) | ORAL | 0 refills | Status: DC

## 2020-10-17 NOTE — Progress Notes (Addendum)
Inpatient Diabetes Program Recommendations  AACE/ADA: New Consensus Statement on Inpatient Glycemic Control (2015)  Target Ranges:  Prepandial:   less than 140 mg/dL      Peak postprandial:   less than 180 mg/dL (1-2 hours)      Critically ill patients:  140 - 180 mg/dL   Lab Results  Component Value Date   GLUCAP 282 (H) 10/17/2020   HGBA1C 9.5 (H) 10/16/2020    Review of Glycemic Control   Diabetes history: Type 2 DM Outpatient Diabetes medications: Humalog 75/25 110 units BID, Onglyza 2.5 mg QD Current orders for Inpatient glycemic control: Lantus 55 units QD, Novolog 10 units TID, Novolog 0-20 units TID & HS  Attempted to speak with patient. Spoke with support person at bedside. Requesting to discuss diabetes at a later time.  Addendum: Spoke with patient regarding outpatient diabetes management. Verifies home medications, however, reports that glucose has been elevated recently.  Reviewed patient's current A1c of 9.5%. Explained what a A1c is and what it measures. Also reviewed goal A1c with patient, importance of good glucose control @ home, and blood sugar goals. Reviewed patho of DM, need for insulin, role of pancreas, DKA, impact of infection, impact of excessive intake of CHO, post prandial measures and target goals, vascular changes and commorbidities. Patient has a meter and reports checking 3 times per day. Reviewed target goals, when to call MD, and denied having issues with outpatient hypoglycemia. Encouraged to check a post prandial following meal times to see how insulin responded to meals.  Admits to drinking large quantities of juices and sweet teas. Stressed the importance of finding alternatives and how this could increase blood sugars without nutritional value. Encouraged plate method, being mindful of CHO intake and eliminating sugary beverages. Will place OP edu and LWWDM booklet for further review.    Lujean Rave, MSN, RNC-OB Diabetes  Coordinator [phone] (8a-5p)

## 2020-10-17 NOTE — Progress Notes (Signed)
Patient discharged with AVS education provided by progression nurse Fayrene Fearing, RN. All questions addressed, PIV's removed with diabetes education provided.

## 2020-10-17 NOTE — Discharge Summary (Signed)
Physician Discharge Summary  Jill Shaw GLO:[id number] DOB: [birth year] DOA: 10/15/2020  PCP: Vevelyn Francois, NP  Admit date: 10/15/2020 Discharge date: 10/17/2020  Admitted From: Home Disposition:  Home  Recommendations for Outpatient Follow-up:  1. Follow up with PCP in 1-2 weeks 2. Continue to titrate insulin as needed 3. F/u with outpatient diabetes eduction as to be arranged by Diabetic Coordinator  Discharge Condition:Improved CODE STATUS:Full Diet recommendation: Diabetic, heart healthy   Brief/Interim Summary: 45 y.o.femalewith medical history significant ofinsulin-dependent type 2 diabetes, CKD stage IIIa, hypertension, chronic diastolic CHF, asthma, COPD, GERD, morbid obesity (BMI 45.15), chronic microcytic anemia, uterine dysfunction, recurrent syncope, history of seizures presented to the ED with DKA  Discharge Diagnoses:  Principal Problem:   DKA (diabetic ketoacidosis) ([place]) Active Problems:   AKI (acute kidney injury) ([place])   Hyperkalemia   Severe sepsis ([place])   Acute metabolic encephalopathy  DKA in the setting of poorly controlled insulin-dependent type 2 diabetes: -Suspicion for dietary indiscretion/increased carbohydrate intake in the setting of holidays. A1c 9.2 on 07/19/2020.  -Pt presented with blood glucose above 500. Bicarb 14, anion gap 17, UA with ketones, and beta hydroxybutyric acid 3.73. VBG with pH 7.30.  -DKA physiology has resolved with IVF hydration and insulin gtt -A1c is currently 9.5 -Glucose trends remained stable in the 200's -On further questioning, patient admitted to dietary noncompliance. See Diabetic Coordinator note. Pt admits to frequently drinking juices and sweet teas -Diabetic coordinator recommendations to continue current home regimen. Outpatient diabetic education to be arranged by diabetic coordinator following patient's discharge home  AKI on CKD stage IIIa: -Likely prerenal from dehydration in the setting of  of severe hyperglycemia/DKA. In addition, home diuretic, ARB, and NSAID use also likely contributing.  -BUN 22, creatinine 2.2. Baseline creatinine is around 1.2. Renal function improved with hydration  Severe hyperkalemia with EKG changes: -Potassium >7.5.Initial EKG showing peaked T waves in widened QRS complexes.  -After discussion with nephrology, patient was given medical treatment for hyperkalemia including calcium gluconate, albuterol nebulizer treatment, insulin/dextrose, Lokelma, and IV Lasix 120 mg.  -Potassium has since normalized  Sepsis ruled out -Afebrile. Tachycardic and tachypneic in the setting of DKA.  -No evidence of acute infection -CT abd unremarkable, reviewed -Procalcitonin is low at 0.16 -Pt clinically improved overnight without further abx  Acute metabolic encephalopathy:  -Likely due to DKA. Patient was obtunded at the time of EMS arrival but has now improved significantly after treatment for DKA. -remains fully awake and alert, answering questions. Neuro exam nonfocal.  Pseudohyponatremia:  -Due to severe hyperglycemia -Now normalized  Hypertension -Now poorly controlled following IVF -Have resumed home BP meds  Hyperlipidemia -Continue home Crestor  Chronic diastolic CHF: -Initially held diuretics -Following IVF hydration overnight, have resumed lasix  Asthma/COPD: Stable. No signs of acute exacerbation at this time. -Continue home inhalers  Chronic microcytic anemia:Stable. Hemoglobin 11.0, at baseline. -Takes an iron supplement at home, continue.  Discharge Instructions  Discharge Instructions    Ambulatory referral to Nutrition and Diabetic Education   Complete by: As directed      Allergies as of 10/17/2020      Reactions   Ketoprofen Nausea And Vomiting   Aspirin Nausea Only   Gabapentin Nausea And Vomiting, Other (See Comments)   upset stomach   Ibuprofen Nausea And Vomiting   Liraglutide Nausea And Vomiting    Naproxen Nausea And Vomiting   Omeprazole-sodium Bicarbonate Nausea And Vomiting   Sulfa Antibiotics Nausea And Vomiting   Tramadol Nausea And  Vomiting, Other (See Comments)   stomach upset      Medication List    STOP taking these medications   HYDROmorphone 2 MG tablet Commonly known as: DILAUDID   meloxicam 7.5 MG tablet Commonly known as: Mobic   nicotine polacrilex 4 MG lozenge Commonly known as: Commit   Vitamin D-3 125 MCG (5000 UT) Tabs     TAKE these medications   albuterol (2.5 MG/3ML) 0.083% nebulizer solution Commonly known as: PROVENTIL Take 3 mLs (2.5 mg total) by nebulization every 6 (six) hours as needed for wheezing or shortness of breath.   albuterol 108 (90 Base) MCG/ACT inhaler Commonly known as: VENTOLIN HFA Inhale 2 puffs into the lungs every 6 (six) hours as needed for wheezing or shortness of breath.   amitriptyline 75 MG tablet Commonly known as: ELAVIL Take 1 tablet (75 mg total) by mouth at bedtime.   blood glucose meter kit and supplies Kit 1 each by Other route See admin instructions. Dispense based on patient and insurance preference. Use up to four times daily as directed. (FOR ICD-9 250.00, 250.01).   budesonide-formoterol 160-4.5 MCG/ACT inhaler Commonly known as: Symbicort INHALE 2 PUFFS INTO THE LUNGS 2 (TWO) TIMES DAILY. What changed:   how much to take  how to take this  when to take this  additional instructions   buPROPion 150 MG 12 hr tablet Commonly known as: Wellbutrin SR Take 1 tablet (150 mg total) by mouth 2 (two) times daily.   cetirizine 10 MG tablet Commonly known as: ZYRTEC Take 1 tablet (10 mg total) by mouth daily.   diclofenac 75 MG EC tablet Commonly known as: VOLTAREN Take 75 mg by mouth 2 (two) times daily as needed for mild pain.   diclofenac Sodium 1 % Gel Commonly known as: VOLTAREN Apply 4 g topically 4 (four) times daily as needed (pain).   ferrous sulfate 325 (65 FE) MG tablet Take 1  tablet (325 mg total) by mouth 3 (three) times daily with meals. What changed: when to take this   fluticasone 50 MCG/ACT nasal spray Commonly known as: FLONASE Place 2 sprays into both nostrils daily.   FreeStyle Libre 14 Day Reader Kerrin Mo 1 application by Does not apply route every 14 (fourteen) days.   furosemide 40 MG tablet Commonly known as: LASIX Take 1 tablet (40 mg total) by mouth 2 (two) times daily.   Glucosamine Sulfate 1000 MG Caps Take 1 capsule (1,000 mg total) by mouth 2 (two) times daily.   hydrocortisone 2.5 % cream Apply topically 2 (two) times daily. What changed: how much to take   hydroquinone 4 % cream APPLY TOPICALLY TWICE DAILY What changed: how much to take   Insulin Lispro Prot & Lispro (75-25) 100 UNIT/ML Kwikpen Commonly known as: HumaLOG Mix 75/25 KwikPen Inject 120 Units into the skin 2 (two) times daily. INJECT 110 UNITS EVERY 12 HOURS What changed:   how much to take  additional instructions   lidocaine 5 % ointment Commonly known as: XYLOCAINE Apply 1 application topically as needed. What changed: reasons to take this   losartan 50 MG tablet Commonly known as: COZAAR Take 1 tablet (50 mg total) by mouth daily.   metoCLOPramide 10 MG tablet Commonly known as: REGLAN Take 1 tablet (10 mg total) by mouth 4 (four) times daily -  before meals and at bedtime.   metoprolol 200 MG 24 hr tablet Commonly known as: TOPROL-XL Take 1 tablet (200 mg total) by mouth daily. Take with  or immediately following a meal. What changed: additional instructions   nicotine 21 mg/24hr patch Commonly known as: Nicoderm CQ Place 1 patch (21 mg total) onto the skin daily.   norethindrone 5 MG tablet Commonly known as: Aygestin Take 2 tablets (10 mg total) by mouth in the morning, at noon, in the evening, and at bedtime. With bleeding   omeprazole 40 MG capsule Commonly known as: PRILOSEC TAKE 1 CAPSULE($RemoveBefore'40MG'DvqaYlUeSvJmV$  TOTAL) BY MOUTH EVERY DAY What changed: See  the new instructions.   Onglyza 2.5 MG Tabs tablet Generic drug: saxagliptin HCl Take 1 tablet (2.5 mg total) by mouth daily.   Pen Needles 30G X 5 MM Misc 1 Units by Does not apply route as directed.   promethazine 12.5 MG tablet Commonly known as: PHENERGAN Take 1 tablet (12.5 mg total) by mouth every 6 (six) hours as needed for nausea or vomiting.   rosuvastatin 5 MG tablet Commonly known as: Crestor Take 1 tablet (5 mg total) by mouth daily.   Spiriva Respimat 2.5 MCG/ACT Aers Generic drug: Tiotropium Bromide Monohydrate Inhale 2 puffs into the lungs daily.   spironolactone 50 MG tablet Commonly known as: ALDACTONE Take 1 tablet (50 mg total) by mouth daily.   sucralfate 1 GM/10ML suspension Commonly known as: CARAFATE Take 10 mLs (1 g total) by mouth 4 (four) times daily -  with meals and at bedtime.   True Metrix Blood Glucose Test test strip Generic drug: glucose blood Use as instructed   True Metrix Meter w/Device Kit 1 each by Does not apply route 4 (four) times daily -  before meals and at bedtime.   TRUEplus Lancets 28G Misc 1 each by Other route in the morning, at noon, in the evening, and at bedtime.   Turmeric 500 MG Caps Take 500 mg by mouth 2 (two) times daily.   Vitamin D (Ergocalciferol) 1.25 MG (50000 UNIT) Caps capsule Commonly known as: DRISDOL Take 1 capsule (50,000 Units total) by mouth every 7 (seven) days. What changed: when to take this       Allergies  Allergen Reactions  . Ketoprofen Nausea And Vomiting  . Aspirin Nausea Only  . Gabapentin Nausea And Vomiting and Other (See Comments)    upset stomach  . Ibuprofen Nausea And Vomiting  . Liraglutide Nausea And Vomiting  . Naproxen Nausea And Vomiting  . Omeprazole-Sodium Bicarbonate Nausea And Vomiting  . Sulfa Antibiotics Nausea And Vomiting  . Tramadol Nausea And Vomiting and Other (See Comments)    stomach upset    Procedures/Studies: CT ABDOMEN PELVIS WO  CONTRAST  Result Date: 10/16/2020 CLINICAL DATA:  Nonlocalized abdominal pain EXAM: CT ABDOMEN AND PELVIS WITHOUT CONTRAST TECHNIQUE: Multidetector CT imaging of the abdomen and pelvis was performed following the standard protocol without IV contrast. COMPARISON:  None. FINDINGS: Lower chest: The visualized heart size within normal limits. No pericardial fluid/thickening. No hiatal hernia. The visualized portions of the lungs are clear. Hepatobiliary: Again noted is heterogeneously hypodense area is seen throughout the liver parenchyma there is a rounded masslike hypodense area again noted within the inferior right liver lobe which appears unchanged since the prior exam dating back to January 31, 2020. No evidence of calcified gallstones or biliary ductal dilatation. Pancreas:  Unremarkable.  No surrounding inflammatory changes. Spleen: Normal in size. Although limited due to the lack of intravenous contrast, normal in appearance. Adrenals/Urinary Tract: Both adrenal glands appear normal. The kidneys and collecting system appear normal without evidence of urinary tract calculus or hydronephrosis.  Bladder is unremarkable. Stomach/Bowel: The stomach, small bowel, and colon are normal in appearance. No inflammatory changes or obstructive findings. appendix is normal. Vascular/Lymphatic: There are no enlarged abdominal or pelvic lymph nodes. No significant gross vascular findings are present given the lack of intravenous contrast. Reproductive: Again noted is somewhat lobular appearance to the posterior fundus which could represent uterine fibroid. Other: No evidence of abdominal wall mass or hernia. Musculoskeletal: No acute or significant osseous findings. IMPRESSION: Scattered heterogeneously hypodense masslike area seen throughout the liver dating back to April 2021 could represent geographic fatty infiltration. However if further evaluation is required would recommend non emergent MRI. No other acute intra-abdominal  or pelvic pathology to explain the patient's symptoms probable posterior fundal uterine fibroids. Electronically Signed   By: Prudencio Pair M.D.   On: 10/16/2020 01:42   DG Chest Port 1 View  Result Date: 10/15/2020 CLINICAL DATA:  Altered mental status EXAM: PORTABLE CHEST 1 VIEW COMPARISON:  07/02/2020 FINDINGS: Shallow inspiration. Cardiac enlargement. Linear atelectasis or fibrosis in the mid lungs. No airspace disease or consolidation. No pleural effusions. No pneumothorax. IMPRESSION: Cardiac enlargement. Linear atelectasis or fibrosis in the mid lungs. Electronically Signed   By: Lucienne Capers M.D.   On: 10/15/2020 18:11     Subjective: Eager to go home  Discharge Exam: Vitals:   10/17/20 0806 10/17/20 1241  BP:  122/84  Pulse:  87  Resp:  18  Temp:  98.6 F (37 C)  SpO2: 99% 98%   Vitals:   10/17/20 0800 10/17/20 0805 10/17/20 0806 10/17/20 1241  BP: 129/75 129/75  122/84  Pulse: 80 83  87  Resp: $Remo'20 19  18  'OfhuM$ Temp: 98.4 F (36.9 C)   98.6 F (37 C)  TempSrc: Oral   Oral  SpO2: 98% 98% 99% 98%  Weight:      Height:        General: Pt is alert, awake, not in acute distress Cardiovascular: RRR, S1/S2 +, no rubs, no gallops Respiratory: CTA bilaterally, no wheezing, no rhonchi Abdominal: Soft, NT, ND, bowel sounds + Extremities: no edema, no cyanosis  The results of significant diagnostics from this hospitalization (including imaging, microbiology, ancillary and laboratory) are listed below for reference.     Microbiology: Recent Results (from the past 240 hour(s))  Resp Panel by RT-PCR (Flu A&B, Covid) Nasopharyngeal Swab     Status: None   Collection Time: 10/15/20  5:58 PM   Specimen: Nasopharyngeal Swab; Nasopharyngeal(NP) swabs in vial transport medium  Result Value Ref Range Status   SARS Coronavirus 2 by RT PCR NEGATIVE NEGATIVE Final    Comment: (NOTE) SARS-CoV-2 target nucleic acids are NOT DETECTED.  The SARS-CoV-2 RNA is generally detectable in  upper respiratory specimens during the acute phase of infection. The lowest concentration of SARS-CoV-2 viral copies this assay can detect is 138 copies/mL. A negative result does not preclude SARS-Cov-2 infection and should not be used as the sole basis for treatment or other patient management decisions. A negative result may occur with  improper specimen collection/handling, submission of specimen other than nasopharyngeal swab, presence of viral mutation(s) within the areas targeted by this assay, and inadequate number of viral copies(<138 copies/mL). A negative result must be combined with clinical observations, patient history, and epidemiological information. The expected result is Negative.  Fact Sheet for Patients:  EntrepreneurPulse.com.au  Fact Sheet for Healthcare Providers:  IncredibleEmployment.be  This test is no t yet approved or cleared by the [place] and  has been authorized for detection and/or diagnosis of SARS-CoV-2 by FDA under an Emergency Use Authorization (EUA). This EUA will remain  in effect (meaning this test can be used) for the duration of the COVID-19 declaration under Section 564(b)(1) of the Act, 21 U.S.C.section 360bbb-3(b)(1), unless the authorization is terminated  or revoked sooner.       Influenza A by PCR NEGATIVE NEGATIVE Final   Influenza B by PCR NEGATIVE NEGATIVE Final    Comment: (NOTE) The Xpert Xpress SARS-CoV-2/FLU/RSV plus assay is intended as an aid in the diagnosis of influenza from Nasopharyngeal swab specimens and should not be used as a sole basis for treatment. Nasal washings and aspirates are unacceptable for Xpert Xpress SARS-CoV-2/FLU/RSV testing.  Fact Sheet for Patients: EntrepreneurPulse.com.au  Fact Sheet for Healthcare Providers: IncredibleEmployment.be  This test is not yet approved or cleared by the [place] FDA and has been  authorized for detection and/or diagnosis of SARS-CoV-2 by FDA under an Emergency Use Authorization (EUA). This EUA will remain in effect (meaning this test can be used) for the duration of the COVID-19 declaration under Section 564(b)(1) of the Act, 21 U.S.C. section 360bbb-3(b)(1), unless the authorization is terminated or revoked.  Performed at [facility], [place] [address], [place], [place] [address]   Culture, blood (routine x 2)     Status: None (Preliminary result)   Collection Time: 10/16/20  3:34 AM   Specimen: BLOOD LEFT ARM  Result Value Ref Range Status   Specimen Description BLOOD LEFT ARM  Final   Special Requests   Final    BOTTLES DRAWN AEROBIC AND ANAEROBIC Blood Culture adequate volume   Culture   Final    NO GROWTH 1 DAY Performed at [facility], [place] [address], [place], [place] [address]    Report Status PENDING  Incomplete  Culture, blood (routine x 2)     Status: None (Preliminary result)   Collection Time: 10/16/20  6:11 AM   Specimen: BLOOD LEFT ARM  Result Value Ref Range Status   Specimen Description BLOOD LEFT ARM  Final   Special Requests   Final    BOTTLES DRAWN AEROBIC ONLY Blood Culture adequate volume   Culture   Final    NO GROWTH 1 DAY Performed at [facility], [place] [address], [place], [place] [address]    Report Status PENDING  Incomplete     Labs: BNP (last 3 results) Recent Labs    02/03/20 0341 06/22/20 0636 09/20/20 1220  BNP 125.5* 266.3* 65.7   Basic Metabolic Panel: Recent Labs  Lab 10/16/20 0346 10/16/20 0730 10/16/20 1148 10/16/20 1536 10/17/20 0405  NA 135 138 137 136 135  K 4.6 3.9 4.3 4.2 4.4  CL 101 103 103 104 103  CO2 20* 22 21* 22 19*  GLUCOSE 223* 152* 178* 228* 299*  BUN $Re'18 16 15 15 14  'xgT$ CREATININE 1.77* 1.62* 1.56* 1.52* 1.46*  CALCIUM 9.5 9.6 9.8 9.3 9.1  MG  --   --   --   --  2.0   Liver Function Tests: Recent Labs  Lab 10/15/20 1742 10/17/20 0405  AST 55* 36  ALT 21 17   ALKPHOS 91 67  BILITOT 1.3* 0.6  PROT 8.2* 6.1*  ALBUMIN 4.0 3.1*   No results for input(s): LIPASE, AMYLASE in the last 168 hours. No results for input(s): AMMONIA in the last 168 hours. CBC: Recent Labs  Lab 10/15/20 1742 10/15/20 1750 10/16/20 0346 10/17/20  0405  WBC 19.2*  --  14.9* 9.5  HGB 11.0* 13.6 9.2* 9.0*  HCT 37.4 40.0 29.4* 30.7*  MCV 72.5*  --  70.2* 72.9*  PLT 341  --  294 267   Cardiac Enzymes: No results for input(s): CKTOTAL, CKMB, CKMBINDEX, TROPONINI in the last 168 hours. BNP: Invalid input(s): POCBNP CBG: Recent Labs  Lab 10/16/20 1229 10/16/20 1631 10/16/20 2113 10/17/20 0613 10/17/20 1108  GLUCAP 198* 231* 279* 282* 264*   D-Dimer No results for input(s): DDIMER in the last 72 hours. Hgb A1c Recent Labs    10/16/20 0730  HGBA1C 9.5*   Lipid Profile No results for input(s): CHOL, HDL, LDLCALC, TRIG, CHOLHDL, LDLDIRECT in the last 72 hours. Thyroid function studies No results for input(s): TSH, T4TOTAL, T3FREE, THYROIDAB in the last 72 hours.  Invalid input(s): FREET3 Anemia work up No results for input(s): VITAMINB12, FOLATE, FERRITIN, TIBC, IRON, RETICCTPCT in the last 72 hours. Urinalysis    Component Value Date/Time   COLORURINE YELLOW 10/15/2020 1942   APPEARANCEUR HAZY (A) 10/15/2020 1942   LABSPEC 1.014 10/15/2020 1942   PHURINE 5.0 10/15/2020 1942   GLUCOSEU >=500 (A) 10/15/2020 1942   HGBUR LARGE (A) 10/15/2020 1942   BILIRUBINUR NEGATIVE 10/15/2020 1942   BILIRUBINUR negative 06/02/2020 1419   BILIRUBINUR NEGATIVE 11/05/2019 1342   KETONESUR 20 (A) 10/15/2020 1942   PROTEINUR NEGATIVE 10/15/2020 1942   UROBILINOGEN 4.0 (A) 06/02/2020 1419   UROBILINOGEN 0.2 01/14/2018 1348   NITRITE NEGATIVE 10/15/2020 1942   LEUKOCYTESUR NEGATIVE 10/15/2020 1942   Sepsis Labs Invalid input(s): PROCALCITONIN,  WBC,  LACTICIDVEN Microbiology Recent Results (from the past 240 hour(s))  Resp Panel by RT-PCR (Flu A&B, Covid)  Nasopharyngeal Swab     Status: None   Collection Time: 10/15/20  5:58 PM   Specimen: Nasopharyngeal Swab; Nasopharyngeal(NP) swabs in vial transport medium  Result Value Ref Range Status   SARS Coronavirus 2 by RT PCR NEGATIVE NEGATIVE Final    Comment: (NOTE) SARS-CoV-2 target nucleic acids are NOT DETECTED.  The SARS-CoV-2 RNA is generally detectable in upper respiratory specimens during the acute phase of infection. The lowest concentration of SARS-CoV-2 viral copies this assay can detect is 138 copies/mL. A negative result does not preclude SARS-Cov-2 infection and should not be used as the sole basis for treatment or other patient management decisions. A negative result may occur with  improper specimen collection/handling, submission of specimen other than nasopharyngeal swab, presence of viral mutation(s) within the areas targeted by this assay, and inadequate number of viral copies(<138 copies/mL). A negative result must be combined with clinical observations, patient history, and epidemiological information. The expected result is Negative.  Fact Sheet for Patients:  EntrepreneurPulse.com.au  Fact Sheet for Healthcare Providers:  IncredibleEmployment.be  This test is no t yet approved or cleared by the [place] FDA and  has been authorized for detection and/or diagnosis of SARS-CoV-2 by FDA under an Emergency Use Authorization (EUA). This EUA will remain  in effect (meaning this test can be used) for the duration of the COVID-19 declaration under Section 564(b)(1) of the Act, 21 U.S.C.section 360bbb-3(b)(1), unless the authorization is terminated  or revoked sooner.       Influenza A by PCR NEGATIVE NEGATIVE Final   Influenza B by PCR NEGATIVE NEGATIVE Final    Comment: (NOTE) The Xpert Xpress SARS-CoV-2/FLU/RSV plus assay is intended as an aid in the diagnosis of influenza from Nasopharyngeal swab specimens and should not be  used as a sole  basis for treatment. Nasal washings and aspirates are unacceptable for Xpert Xpress SARS-CoV-2/FLU/RSV testing.  Fact Sheet for Patients: EntrepreneurPulse.com.au  Fact Sheet for Healthcare Providers: IncredibleEmployment.be  This test is not yet approved or cleared by the [place] FDA and has been authorized for detection and/or diagnosis of SARS-CoV-2 by FDA under an Emergency Use Authorization (EUA). This EUA will remain in effect (meaning this test can be used) for the duration of the COVID-19 declaration under Section 564(b)(1) of the Act, 21 U.S.C. section 360bbb-3(b)(1), unless the authorization is terminated or revoked.  Performed at [facility], [place] [address], [place], [place] [address]   Culture, blood (routine x 2)     Status: None (Preliminary result)   Collection Time: 10/16/20  3:34 AM   Specimen: BLOOD LEFT ARM  Result Value Ref Range Status   Specimen Description BLOOD LEFT ARM  Final   Special Requests   Final    BOTTLES DRAWN AEROBIC AND ANAEROBIC Blood Culture adequate volume   Culture   Final    NO GROWTH 1 DAY Performed at [facility], [place] [address] [place], [place] [address]    Report Status PENDING  Incomplete  Culture, blood (routine x 2)     Status: None (Preliminary result)   Collection Time: 10/16/20  6:11 AM   Specimen: BLOOD LEFT ARM  Result Value Ref Range Status   Specimen Description BLOOD LEFT ARM  Final   Special Requests   Final    BOTTLES DRAWN AEROBIC ONLY Blood Culture adequate volume   Culture   Final    NO GROWTH 1 DAY Performed at [facility], [place] [address], [place], [place] [address]    Report Status PENDING  Incomplete   Time spent: 30 min  SIGNED:   Marylu Lund, MD  [facility] 10/17/2020, 1:33 PM  If 7PM-7AM, please contact night-coverage

## 2020-10-18 LAB — GLUCOSE, CAPILLARY: Glucose-Capillary: 570 mg/dL (ref 70–99)

## 2020-10-19 ENCOUNTER — Other Ambulatory Visit: Payer: Self-pay | Admitting: Obstetrics and Gynecology

## 2020-10-19 DIAGNOSIS — N939 Abnormal uterine and vaginal bleeding, unspecified: Secondary | ICD-10-CM

## 2020-10-19 MED ORDER — NORETHINDRONE ACETATE 5 MG PO TABS: 10.0000 mg | ORAL_TABLET | Freq: Four times a day (QID) | ORAL | 0 refills | Status: DC

## 2020-10-21 ENCOUNTER — Encounter: Payer: Self-pay | Admitting: Podiatry

## 2020-10-21 LAB — CULTURE, BLOOD (ROUTINE X 2)
Culture: NO GROWTH
Culture: NO GROWTH
Special Requests: ADEQUATE
Special Requests: ADEQUATE

## 2020-10-24 ENCOUNTER — Other Ambulatory Visit: Payer: Self-pay | Admitting: Physician Assistant

## 2020-10-25 ENCOUNTER — Other Ambulatory Visit: Payer: Self-pay | Admitting: Nurse Practitioner

## 2020-10-27 ENCOUNTER — Other Ambulatory Visit: Payer: Self-pay

## 2020-10-27 ENCOUNTER — Ambulatory Visit: Payer: Self-pay | Admitting: Cardiovascular Disease

## 2020-10-27 ENCOUNTER — Ambulatory Visit (INDEPENDENT_AMBULATORY_CARE_PROVIDER_SITE_OTHER): Payer: Medicaid Other | Admitting: Obstetrics and Gynecology

## 2020-10-27 VITALS — BP 152/85 | HR 111 | Wt 272.7 lb

## 2020-10-27 DIAGNOSIS — R1033 Periumbilical pain: Secondary | ICD-10-CM

## 2020-10-27 DIAGNOSIS — N939 Abnormal uterine and vaginal bleeding, unspecified: Secondary | ICD-10-CM

## 2020-10-27 DIAGNOSIS — R102 Pelvic and perineal pain: Secondary | ICD-10-CM | POA: Diagnosis not present

## 2020-10-27 MED ORDER — HYDROMORPHONE HCL 2 MG PO TABS: 2.0000 mg | ORAL_TABLET | Freq: Three times a day (TID) | ORAL | 0 refills | Status: DC | PRN

## 2020-10-27 MED ORDER — MEDROXYPROGESTERONE ACETATE 150 MG/ML IM SUSP
150.0000 mg | Freq: Once | INTRAMUSCULAR | Status: AC
  Administered 2020-10-27: 150 mg via INTRAMUSCULAR

## 2020-10-27 NOTE — Progress Notes (Signed)
Obstetrics and Gynecology Visit Return Patient Evaluation  Appointment Date: 10/27/2020  Primary Care Provider: [place] for [facility]  Chief Complaint: follow up AUB, pelvic pain. Repeat depo provera injection  History of Present Illness:  Jill Shaw is a 45 y.o. here for scheduled follow up and depo  Patient has pain nearly all the time and it's severe several times a month, which she takes the dilaudid 2-3 tabs a day for. She believes she probably uses the dilaudid for about 7-10d out of the month.  She currently has no bleeding with most recent bleeding last month/early this month. . She is using the aygestin currently about $RemoveBeforeD'10mg'OfWvkfsnQeTHTS$  3-4x/day  She was most recently in the hospital for dka and discharged on 1/2  She states she currently has medicaid coverage  Review of Systems:  as noted in the History of Present Illness.  Patient Active Problem List   Diagnosis Date Noted  . DKA (diabetic ketoacidosis) ([place]) 10/15/2020  . Hyperkalemia 10/15/2020  . Severe sepsis ([place]) 10/15/2020  . Acute metabolic encephalopathy 54/65/0354  . Sinus tachycardia 10/14/2020  . Precordial pain 10/14/2020  . Abnormal findings on diagnostic imaging of lung 08/19/2020  . Physical deconditioning 08/19/2020  . History of endometrial ablation 07/27/2020  . History of alcohol use 07/20/2020  . Current moderate episode of major depressive disorder without prior episode ([place]) 07/20/2020  . Sickle cell trait ([place]) 07/19/2020  . Elevated d-dimer 07/13/2020  . Healthcare maintenance 07/12/2020  . Shortness of breath 07/12/2020  . Atypical chest pain 07/12/2020  . At risk for obstructive sleep apnea 07/12/2020  . DKA (diabetic ketoacidoses) 07/02/2020  . Acute on chronic diastolic (congestive) heart failure ([place]) 06/22/2020  . Abdominal pain 03/08/2020  . Chronic diastolic CHF (congestive heart failure) ([place]) 03/08/2020  . AKI (acute kidney injury) ([place])  01/29/2020  . Symptomatic anemia 12/19/2019  . Elevated sed rate 11/26/2019  . Elevated C-reactive protein (CRP) 11/26/2019  . Hypoglycemia 02/07/2019  . Hypotension 02/07/2019  . Lactic acidosis 02/07/2019  . Right ankle pain 02/07/2019  . Chronic obstructive pulmonary disease ([place]) 02/05/2019  . Seizure ([place]) 07/31/2018  . Vitamin D deficiency 05/31/2017  . Gastroesophageal reflux disease 05/24/2017  . Abnormal uterine bleeding (AUB) 05/17/2017  . Anemia of chronic disease 04/06/2017  . Knee pain, chronic 03/27/2016  . Controlled type 2 diabetes mellitus without complication, with long-term current use of insulin ([place]) 03/27/2016  . Essential hypertension 03/27/2016  . Morbid obesity ([place]) 03/27/2016  . Irritable bowel syndrome with constipation 03/27/2016  . Tobacco dependence 03/27/2016  . CKD (chronic kidney disease) stage 2, GFR 60-89 ml/min 03/25/2016  . Arthropathy, lower leg 05/04/2013  . CTS (carpal tunnel syndrome) 05/04/2013  . Peripheral edema 05/04/2013  . Chronic pain 05/13/2012  . Diabetic gastroparesis ([place]) 11/06/2006  . Hot flashes 11/06/2006   Medications:  Jill Shaw had no medications administered during this visit. Current Outpatient Medications  Medication Sig Dispense Refill  . HYDROmorphone (DILAUDID) 2 MG tablet Take 1 tablet (2 mg total) by mouth every 8 (eight) hours as needed for severe pain. 30 tablet 0  . albuterol (PROVENTIL) (2.5 MG/3ML) 0.083% nebulizer solution Take 3 mLs (2.5 mg total) by nebulization every 6 (six) hours as needed for wheezing or shortness of breath. 150 mL 6  . albuterol (VENTOLIN HFA) 108 (90 Base) MCG/ACT inhaler Inhale 2 puffs into the lungs every 6 (six) hours as needed for wheezing or shortness of breath. 8 g  6  . amitriptyline (ELAVIL) 75 MG tablet Take 1 tablet (75 mg total) by mouth at bedtime. 30 tablet 1  . blood glucose meter kit and supplies KIT 1 each by Other route See admin instructions. Dispense based on  patient and insurance preference. Use up to four times daily as directed. (FOR ICD-9 250.00, 250.01). 1 each 2  . Blood Glucose Monitoring Suppl (TRUE METRIX METER) w/Device KIT 1 each by Does not apply route 4 (four) times daily -  before meals and at bedtime. 1 kit 0  . budesonide-formoterol (SYMBICORT) 160-4.5 MCG/ACT inhaler INHALE 2 PUFFS INTO THE LUNGS 2 (TWO) TIMES DAILY. (Patient taking differently: Inhale 2 puffs into the lungs 2 (two) times daily.) 10.2 g 6  . buPROPion (WELLBUTRIN SR) 150 MG 12 hr tablet Take 1 tablet (150 mg total) by mouth 2 (two) times daily. 60 tablet 11  . cetirizine (ZYRTEC) 10 MG tablet Take 1 tablet (10 mg total) by mouth daily. 30 tablet 3  . Continuous Blood Gluc Receiver (FREESTYLE LIBRE 14 DAY READER) DEVI 1 application by Does not apply route every 14 (fourteen) days. 1 each 0  . diclofenac (VOLTAREN) 75 MG EC tablet Take 75 mg by mouth 2 (two) times daily as needed for mild pain.    Marland Kitchen diclofenac Sodium (VOLTAREN) 1 % GEL Apply 4 g topically 4 (four) times daily as needed (pain).    . ferrous sulfate 325 (65 FE) MG tablet Take 1 tablet (325 mg total) by mouth 3 (three) times daily with meals. (Patient taking differently: Take 325 mg by mouth daily with breakfast.) 30 tablet 3  . fluticasone (FLONASE) 50 MCG/ACT nasal spray Place 2 sprays into both nostrils daily. 16 g 6  . furosemide (LASIX) 40 MG tablet Take 1 tablet (40 mg total) by mouth 2 (two) times daily. 60 tablet 5  . Glucosamine Sulfate 1000 MG CAPS Take 1 capsule (1,000 mg total) by mouth 2 (two) times daily. (Patient taking differently: Take 1,000 mg by mouth 2 (two) times daily.) 180 capsule 3  . glucose blood (TRUE METRIX BLOOD GLUCOSE TEST) test strip Use as instructed 400 each 12  . hydrocortisone 2.5 % cream Apply topically 2 (two) times daily. (Patient taking differently: Apply 1 application topically 2 (two) times daily.) 30 g 11  . hydroquinone 4 % cream APPLY TOPICALLY TWICE DAILY (Patient  taking differently: Apply 1 application topically 2 (two) times daily.) 28.35 g 0  . Insulin Lispro Prot & Lispro (HUMALOG MIX 75/25 KWIKPEN) (75-25) 100 UNIT/ML Kwikpen Inject 120 Units into the skin 2 (two) times daily. INJECT 110 UNITS EVERY 12 HOURS (Patient taking differently: Inject 110 Units into the skin 2 (two) times daily.) 72 mL 11  . Insulin Pen Needle (PEN NEEDLES) 30G X 5 MM MISC 1 Units by Does not apply route as directed. 100 each 11  . lidocaine (XYLOCAINE) 5 % ointment Apply 1 application topically as needed. (Patient taking differently: Apply 1 application topically as needed for mild pain.) 35.44 g 0  . losartan (COZAAR) 50 MG tablet Take 1 tablet (50 mg total) by mouth daily. 30 tablet 2  . metoCLOPramide (REGLAN) 10 MG tablet Take 1 tablet (10 mg total) by mouth 4 (four) times daily -  before meals and at bedtime. 120 tablet 0  . metoprolol succinate (TOPROL-XL) 200 MG 24 hr tablet Take 1 tablet (200 mg total) by mouth daily. Take with or immediately following a meal. (Patient taking differently: Take 200 mg by  mouth daily.) 90 tablet 2  . nicotine (NICODERM CQ) 21 mg/24hr patch Place 1 patch (21 mg total) onto the skin daily. (Patient not taking: No sig reported) 28 patch 3  . norethindrone (AYGESTIN) 5 MG tablet Take 2 tablets (10 mg total) by mouth in the morning, at noon, in the evening, and at bedtime. With bleeding 120 tablet 0  . omeprazole (PRILOSEC) 40 MG capsule TAKE 1 CAPSULE($RemoveBefore'40MG'[id number]$  TOTAL) BY MOUTH EVERY DAY (Patient taking differently: Take 40 mg by mouth daily.) 30 capsule 2  . promethazine (PHENERGAN) 12.5 MG tablet Take 1 tablet (12.5 mg total) by mouth every 6 (six) hours as needed for nausea or vomiting. 15 tablet 0  . rosuvastatin (CRESTOR) 5 MG tablet Take 1 tablet (5 mg total) by mouth daily. 90 tablet 3  . saxagliptin HCl (ONGLYZA) 2.5 MG TABS tablet Take 1 tablet (2.5 mg total) by mouth daily. 90 tablet 3  . spironolactone (ALDACTONE) 50 MG tablet Take 1 tablet  (50 mg total) by mouth daily. 30 tablet 3  . sucralfate (CARAFATE) 1 GM/10ML suspension Take 10 mLs (1 g total) by mouth 4 (four) times daily -  with meals and at bedtime. 420 mL 0  . Tiotropium Bromide Monohydrate (SPIRIVA RESPIMAT) 2.5 MCG/ACT AERS Inhale 2 puffs into the lungs daily. 4 g 6  . tiZANidine (ZANAFLEX) 4 MG tablet Take by mouth.    . TRUEplus Lancets 28G MISC 1 each by Other route in the morning, at noon, in the evening, and at bedtime.    . Turmeric 500 MG CAPS Take 500 mg by mouth 2 (two) times daily. 180 capsule 3  . Vitamin D, Ergocalciferol, (DRISDOL) 1.25 MG (50000 UNIT) CAPS capsule Take 1 capsule (50,000 Units total) by mouth every 7 (seven) days. (Patient taking differently: Take 50,000 Units by mouth every Friday.) 5 capsule 6   No current facility-administered medications for this visit.    Allergies: is allergic to ketoprofen, aspirin, gabapentin, ibuprofen, liraglutide, naproxen, omeprazole-sodium bicarbonate, sulfa antibiotics, and tramadol.  Physical Exam:  BP (!) 152/85   Pulse (!) 111   Wt 272 lb 11.2 oz (123.7 kg)   BMI 46.81 kg/m  Body mass index is 46.81 kg/m. General appearance: Well nourished, well developed female in no acute distress.  Neuro/Psych:  Normal mood and affect.    Assessment: pt stable  Plan:  1. Pelvic pain and AUB I d/w her re: only taking the aygestin only with bleeding and not chronically and to only use the dilaudid once or a twice a week at the most.   Pt would still like a 2nd opinion and states that where she was initially sent didn't accept her b/c of medicaid. Will refer back to [facility]; pt initially had an appt there in July but didn't keep it.   - Ambulatory referral to Gynecology  RTC: 3 months  Durene Romans MD Attending Center for [facility])

## 2020-10-29 ENCOUNTER — Ambulatory Visit: Payer: Medicaid Other | Admitting: Cardiology

## 2020-10-29 ENCOUNTER — Other Ambulatory Visit: Payer: Self-pay

## 2020-10-29 ENCOUNTER — Encounter: Payer: Self-pay | Admitting: Cardiology

## 2020-10-29 VITALS — BP 128/71 | HR 104 | Temp 98.3°F | Resp 16 | Ht 64.0 in | Wt 278.0 lb

## 2020-10-29 DIAGNOSIS — R079 Chest pain, unspecified: Secondary | ICD-10-CM

## 2020-10-29 DIAGNOSIS — I5032 Chronic diastolic (congestive) heart failure: Secondary | ICD-10-CM

## 2020-10-29 DIAGNOSIS — R0609 Other forms of dyspnea: Secondary | ICD-10-CM

## 2020-10-29 DIAGNOSIS — R0600 Dyspnea, unspecified: Secondary | ICD-10-CM

## 2020-10-29 NOTE — Progress Notes (Signed)
Patient referred by Vevelyn Francois, NP for congestive heart failure  Subjective:   Jill Shaw, female    DOB: 07-17-1976, 45 y.o.   MRN: [id number]   Chief Complaint  Patient presents with  . Chest Pain     HPI  45 year old African-American female with hypertension, type 2 diabetes mellitus, HFpEF, COPD, tobacco dependence, morbid obesity, microcytic anemia, uterine dysfunction, recurrent syncope  Since her last office visit with me, she was admitted to [facility] after being found unresponsive at home. She was diagnosed with DKA. This was attributed to medication and dietary indiscretion.  She continues ot have exertional dyspnea with minimal activity, along with exertional chest tightness that improves with rest.    Current Outpatient Medications on File Prior to Visit  Medication Sig Dispense Refill  . albuterol (PROVENTIL) (2.5 MG/3ML) 0.083% nebulizer solution Take 3 mLs (2.5 mg total) by nebulization every 6 (six) hours as needed for wheezing or shortness of breath. 150 mL 6  . albuterol (VENTOLIN HFA) 108 (90 Base) MCG/ACT inhaler Inhale 2 puffs into the lungs every 6 (six) hours as needed for wheezing or shortness of breath. 8 g 6  . amitriptyline (ELAVIL) 75 MG tablet Take 1 tablet (75 mg total) by mouth at bedtime. 30 tablet 1  . blood glucose meter kit and supplies KIT 1 each by Other route See admin instructions. Dispense based on patient and insurance preference. Use up to four times daily as directed. (FOR ICD-9 250.00, 250.01). 1 each 2  . Blood Glucose Monitoring Suppl (TRUE METRIX METER) w/Device KIT 1 each by Does not apply route 4 (four) times daily -  before meals and at bedtime. 1 kit 0  . budesonide-formoterol (SYMBICORT) 160-4.5 MCG/ACT inhaler INHALE 2 PUFFS INTO THE LUNGS 2 (TWO) TIMES DAILY. (Patient taking differently: Inhale 2 puffs into the lungs 2 (two) times daily.) 10.2 g 6  . buPROPion (WELLBUTRIN SR) 150 MG 12 hr tablet Take 1 tablet  (150 mg total) by mouth 2 (two) times daily. 60 tablet 11  . cetirizine (ZYRTEC) 10 MG tablet Take 1 tablet (10 mg total) by mouth daily. 30 tablet 3  . Continuous Blood Gluc Receiver (FREESTYLE LIBRE 14 DAY READER) DEVI 1 application by Does not apply route every 14 (fourteen) days. 1 each 0  . diclofenac (VOLTAREN) 75 MG EC tablet Take 75 mg by mouth 2 (two) times daily as needed for mild pain.    Marland Kitchen diclofenac Sodium (VOLTAREN) 1 % GEL Apply 4 g topically 4 (four) times daily as needed (pain).    . ferrous sulfate 325 (65 FE) MG tablet Take 1 tablet (325 mg total) by mouth 3 (three) times daily with meals. (Patient taking differently: Take 325 mg by mouth daily with breakfast.) 30 tablet 3  . fluticasone (FLONASE) 50 MCG/ACT nasal spray Place 2 sprays into both nostrils daily. 16 g 6  . furosemide (LASIX) 40 MG tablet Take 1 tablet (40 mg total) by mouth 2 (two) times daily. 60 tablet 5  . Glucosamine Sulfate 1000 MG CAPS Take 1 capsule (1,000 mg total) by mouth 2 (two) times daily. (Patient taking differently: Take 1,000 mg by mouth 2 (two) times daily.) 180 capsule 3  . glucose blood (TRUE METRIX BLOOD GLUCOSE TEST) test strip Use as instructed 400 each 12  . hydrocortisone 2.5 % cream Apply topically 2 (two) times daily. (Patient taking differently: Apply 1 application topically 2 (two) times daily.) 30 g 11  . HYDROmorphone (  DILAUDID) 2 MG tablet Take 1 tablet (2 mg total) by mouth every 8 (eight) hours as needed for severe pain. 30 tablet 0  . hydroquinone 4 % cream APPLY TOPICALLY TWICE DAILY (Patient taking differently: Apply 1 application topically 2 (two) times daily.) 28.35 g 0  . Insulin Lispro Prot & Lispro (HUMALOG MIX 75/25 KWIKPEN) (75-25) 100 UNIT/ML Kwikpen Inject 120 Units into the skin 2 (two) times daily. INJECT 110 UNITS EVERY 12 HOURS (Patient taking differently: Inject 110 Units into the skin 2 (two) times daily.) 72 mL 11  . Insulin Pen Needle (PEN NEEDLES) 30G X 5 MM MISC 1  Units by Does not apply route as directed. 100 each 11  . lidocaine (XYLOCAINE) 5 % ointment Apply 1 application topically as needed. (Patient taking differently: Apply 1 application topically as needed for mild pain.) 35.44 g 0  . losartan (COZAAR) 50 MG tablet Take 1 tablet (50 mg total) by mouth daily. 30 tablet 2  . metoCLOPramide (REGLAN) 10 MG tablet Take 1 tablet (10 mg total) by mouth 4 (four) times daily -  before meals and at bedtime. 120 tablet 0  . metoprolol succinate (TOPROL-XL) 200 MG 24 hr tablet Take 1 tablet (200 mg total) by mouth daily. Take with or immediately following a meal. (Patient taking differently: Take 200 mg by mouth daily.) 90 tablet 2  . nicotine (NICODERM CQ) 21 mg/24hr patch Place 1 patch (21 mg total) onto the skin daily. (Patient not taking: No sig reported) 28 patch 3  . norethindrone (AYGESTIN) 5 MG tablet Take 2 tablets (10 mg total) by mouth in the morning, at noon, in the evening, and at bedtime. With bleeding 120 tablet 0  . omeprazole (PRILOSEC) 40 MG capsule TAKE 1 CAPSULE($RemoveBefore'40MG'[id number]$  TOTAL) BY MOUTH EVERY DAY (Patient taking differently: Take 40 mg by mouth daily.) 30 capsule 2  . promethazine (PHENERGAN) 12.5 MG tablet Take 1 tablet (12.5 mg total) by mouth every 6 (six) hours as needed for nausea or vomiting. 15 tablet 0  . rosuvastatin (CRESTOR) 5 MG tablet Take 1 tablet (5 mg total) by mouth daily. 90 tablet 3  . saxagliptin HCl (ONGLYZA) 2.5 MG TABS tablet Take 1 tablet (2.5 mg total) by mouth daily. 90 tablet 3  . spironolactone (ALDACTONE) 50 MG tablet Take 1 tablet (50 mg total) by mouth daily. 30 tablet 3  . sucralfate (CARAFATE) 1 GM/10ML suspension Take 10 mLs (1 g total) by mouth 4 (four) times daily -  with meals and at bedtime. 420 mL 0  . Tiotropium Bromide Monohydrate (SPIRIVA RESPIMAT) 2.5 MCG/ACT AERS Inhale 2 puffs into the lungs daily. 4 g 6  . tiZANidine (ZANAFLEX) 4 MG tablet Take by mouth.    . TRUEplus Lancets 28G MISC 1 each by Other  route in the morning, at noon, in the evening, and at bedtime.    . Turmeric 500 MG CAPS Take 500 mg by mouth 2 (two) times daily. 180 capsule 3  . Vitamin D, Ergocalciferol, (DRISDOL) 1.25 MG (50000 UNIT) CAPS capsule Take 1 capsule (50,000 Units total) by mouth every 7 (seven) days. (Patient taking differently: Take 50,000 Units by mouth every Friday.) 5 capsule 6  . [DISCONTINUED] hydrALAZINE (APRESOLINE) 50 MG tablet Take 1 tablet (50 mg total) by mouth 3 (three) times daily. 90 tablet 0   No current facility-administered medications on file prior to visit.    Cardiovascular and other pertinent studies:  EKG 10/14/2020: Sinus tachycardia 111 bpm  Cannot exclude old  anteroseptal infarct  EKG 09/01/2020: Sinus rhythm 85 bpm  Poor R wave progression Otherwise normal EKG  Vascular US 07/16/2020: No DVT  Echocardiogram 04/30/2020: 1. Left ventricular ejection fraction, by estimation, is 55 to 60%. The  left ventricle has normal function. The left ventricle has no regional  wall motion abnormalities. There is moderate concentric left ventricular  hypertrophy. Left ventricular  diastolic parameters are consistent with Grade I diastolic dysfunction  (impaired relaxation). Elevated left ventricular end-diastolic pressure.  2. Right ventricular systolic function is normal. The right ventricular  size is normal.  3. The mitral valve is normal in structure. Trivial mitral valve  regurgitation. No evidence of mitral stenosis.  4. The aortic valve is normal in structure. Aortic valve regurgitation is  not visualized. No aortic stenosis is present.  5. The inferior vena cava is dilated in size with <50% respiratory  variability, suggesting right atrial pressure of 15 mmHg.    Recent labs: 10/17/2020: Glucose 299, BUN/Cr 14/1.46. EGFR 46. Na/K 135/4.4. Albumin 3.1. Rest of the CMP normal H/H 9/30. MCV 72. Platelets 267 HbA1C 9.5%  10/07/2020: H/H 11/36. MCV 70. Platelets 390 WBC  13k  09/20/2020: Glucose 356, BUN/Cr 16/1.25. EGFR 61. Na/K 137/4.7. Rest of the CMP normal H/H 8/29. MCV 74. Platelets 338 BNP 95  07/26/2020: Glucose 160, BUN/Cr 10/1.13. EGFR 60. Na/K 140/4.0. Rest of the CMP normal H/H 9.5/34.7. MCV 70. Platelets 311 HbA1C 9.2% Chol 135, TG 166, HDL 29, LDL 77   Review of Systems  Cardiovascular: Positive for chest pain, dyspnea on exertion, leg swelling and orthopnea. Negative for palpitations and syncope.         Vitals:   10/29/20 1444  BP: 128/71  Pulse: (!) 104  Resp: 16  Temp: 98.3 F (36.8 C)  SpO2: 98%     There is no height or weight on file to calculate BMI. Filed Weights   10/29/20 1444  Weight: 278 lb (126.1 kg)     Objective:   Physical Exam Vitals and nursing note reviewed.  Constitutional:      General: She is in acute distress.  Neck:     Vascular: No JVD.  Cardiovascular:     Rate and Rhythm: Regular rhythm. Tachycardia present.     Pulses: Normal pulses.     Heart sounds: Normal heart sounds. No murmur heard.   Pulmonary:     Effort: Pulmonary effort is normal.     Breath sounds: Normal breath sounds. No wheezing or rales.  Musculoskeletal:     Right lower leg: No edema.     Left lower leg: No edema.         Assessment & Recommendations:   45 year old African-American female with hypertension, type 2 diabetes mellitus, HFpEF, COPD, tobacco dependence, morbid obesity, microcytic anemia, uterine dysfunction, recurrent syncope  Exertional chest pain, dyspnea: Multiple risk factors for CAD. Multiple possible etiologies, including hypertension, anemia, obesity.  Will obtain lexiscan nuclear stress test. Will need to be conservative about any invasive management recommendations given her underlying anemia. Recommend management as per PCP/OBGYN Continue current anti anginal therapy, including metoprolol succinate 200 mg daily.  Chronic heart failure with preserved ejection fraction (HCC) Currently  euvolumic Likely related to hypertension, obesity, controlled type 2 diabetes mellitus.  Other differential for her dyspnea is microcytic anemia.  Defer management to PCP.  Continue spironolactone 50 mg daily. Reduce salt intake to <2 g.  Hypertension Now better controlled.   Controlled type 2 diabetes mellitus: Recent DKA. Needs aggressive  management. Defer to PCP.  F/u after stress test   Nigel Mormon, MD Pager: [phone] Office: [phone]

## 2020-10-30 ENCOUNTER — Other Ambulatory Visit: Payer: Self-pay | Admitting: Nurse Practitioner

## 2020-11-01 NOTE — Telephone Encounter (Signed)
Is this okay to refill , she has gotten this refill through another provider.

## 2020-11-03 ENCOUNTER — Telehealth: Payer: Medicaid Other | Admitting: Nurse Practitioner

## 2020-11-08 ENCOUNTER — Other Ambulatory Visit: Payer: Self-pay | Admitting: Nurse Practitioner

## 2020-11-08 ENCOUNTER — Ambulatory Visit (HOSPITAL_COMMUNITY): Payer: Medicaid Other | Attending: Nurse Practitioner

## 2020-11-08 DIAGNOSIS — I10 Essential (primary) hypertension: Secondary | ICD-10-CM

## 2020-11-08 NOTE — Telephone Encounter (Signed)
Please see patient refill request.

## 2020-11-09 ENCOUNTER — Other Ambulatory Visit: Payer: Self-pay | Admitting: Nurse Practitioner

## 2020-11-10 ENCOUNTER — Encounter (HOSPITAL_COMMUNITY): Payer: Self-pay | Admitting: Certified Registered Nurse Anesthetist

## 2020-11-10 ENCOUNTER — Ambulatory Visit: Payer: Medicaid Other | Admitting: Neurology

## 2020-11-10 ENCOUNTER — Other Ambulatory Visit: Payer: Self-pay

## 2020-11-10 DIAGNOSIS — R202 Paresthesia of skin: Secondary | ICD-10-CM

## 2020-11-10 DIAGNOSIS — E1140 Type 2 diabetes mellitus with diabetic neuropathy, unspecified: Secondary | ICD-10-CM

## 2020-11-10 NOTE — Progress Notes (Signed)
Patient is scheduled for MRI on 11/11/20, per Antionette Poles PA with anesthesia patient is scheduled for a NM stress test on 11/24/20 for ongoing exertional chest pain and dyspnea.  That stress test will need to be completed before the Patient can have her MRI.  I left message for Sabrina at [facility] to make her aware that the MRI scheduled for 11/11/20 will need to be moved until after the stress test can be completed.

## 2020-11-10 NOTE — Procedures (Signed)
[facility]  [address] [place], [address]  [place], [place] [address] Tel: [phone] Fax:  [phone] Test Date:  11/10/2020  Patient: Jill Shaw DOB: [birth year] Physician: Nita Sickle, DO  Sex: Female Height: 5\' 4"  Ref Phys: , DO  ID#: Nita Sickle   Technician:    Patient Complaints: This is a 45 year old female with diabetic polyneuropathy referred for evaluation of progressive painful paresthesias.  NCV & EMG Findings: Extensive electrodiagnostic testing of the left upper and lower extremity shows:  1. Left median sensory response shows prolonged latency (4.2 ms).  All sensory amplitudes in the upper extremity are reduced including the left median, ulnar, and radial nerves (L11.7, L6.5, L16.5 V). 2. Left sural and superficial peroneal sensory responses are absent. 3. Left median, ulnar, and peroneal (TA) motor responses are within normal limits.  Left peroneal (EDB) and tibial motor responses show reduced amplitude. 4. Left tibial H reflex study is within normal limits. 5. Sparse chronic motor axonal loss changes are isolated to the left flexor digitorum longus muscles.  There is no evidence of accompanying active denervation in any of the tested muscles.   Impression: 1. 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 2. A superimposed left carpal tunnel syndrome (moderate) was also likely, correlate clinically   ___________________________ 56, DO    Nerve Conduction Studies Anti Sensory Summary Table   Stim Site NR Peak (ms) Norm Peak (ms) P-T Amp (V) Norm P-T Amp  Left Median Anti Sensory (2nd Digit)  33C  Wrist    4.2 <3.4 11.7 >20  Left Radial Anti Sensory (Base 1st Digit)  33C  Wrist    2.0 <2.7 16.5 >18  Left Sup Peroneal Anti Sensory (Ant Lat Mall)  33C  12 cm NR  <4.5  >5  Left Sural Anti Sensory (Lat Mall)  33C  Calf NR  <4.5  >5   Left Ulnar Anti Sensory (5th Digit)  33C  Wrist    3.1 <3.1 6.5 >12   Motor Summary Table   Stim Site NR Onset (ms) Norm Onset (ms) O-P Amp (mV) Norm O-P Amp Site1 Site2 Delta-0 (ms) Dist (cm) Vel (m/s) Norm Vel (m/s)  Left Median Motor (Abd Poll Brev)  33C  Wrist    3.6 <3.9 12.7 >6 Elbow Wrist 5.1 28.0 55 >50  Elbow    8.7  12.2         Left Peroneal Motor (Ext Dig Brev)  33C  Ankle    3.5 <5.5 1.9 >3 B Fib Ankle 7.4 33.0 45 >40  B Fib    10.9  1.5  Poplt B Fib 1.8 8.0 44 >40  Poplt    12.7  1.3         Left Peroneal TA Motor (Tib Ant)  33C  Fib Head    2.6 <4.0 5.3 >4 Poplit Fib Head 1.3 8.0 62 >40  Poplit    3.9  5.1         Left Tibial Motor (Abd Hall Brev)  33C  Ankle NR  <6.0  >8 Knee Ankle  0.0  >40  Knee NR            Left Ulnar Motor (Abd Dig Minimi)  33C  Wrist    2.7 <3.1 8.0 >7 B Elbow Wrist 3.6 20.0 56 >50  B Elbow    6.3  7.1  A Elbow B Elbow 1.8 10.0 56 >50  A Elbow    8.1  6.8          H Reflex Studies   NR H-Lat (ms) Lat Norm (ms) L-R H-Lat (ms)  Left Tibial (Gastroc)  33C     34.56 <35    EMG   Side Muscle Ins Act Fibs Psw Fasc Number Recrt Dur Dur. Amp Amp. Poly Poly. Comment  Left AntTibialis Nml Nml Nml Nml Nml Nml Nml Nml Nml Nml Nml Nml N/A  Left Gastroc Nml Nml Nml Nml Nml Nml Nml Nml Nml Nml Nml Nml N/A  Left Flex Dig Long Nml Nml Nml Nml 1- Rapid Some 1+ Some 1+ Some 1+ N/A  Left RectFemoris Nml Nml Nml Nml Nml Nml Nml Nml Nml Nml Nml Nml N/A  Left GluteusMed Nml Nml Nml Nml Nml Nml Nml Nml Nml Nml Nml Nml N/A  Left 1stDorInt Nml Nml Nml Nml Nml Nml Nml Nml Nml Nml Nml Nml N/A  Left Abd Poll Brev Nml Nml Nml Nml Nml Nml Nml Nml Nml Nml Nml Nml N/A  Left PronatorTeres Nml Nml Nml Nml Nml Nml Nml Nml Nml Nml Nml Nml N/A  Left Biceps Nml Nml Nml Nml Nml Nml Nml Nml Nml Nml Nml Nml N/A  Left Triceps Nml Nml Nml Nml Nml Nml Nml Nml Nml Nml Nml Nml N/A  Left Deltoid Nml Nml Nml Nml Nml Nml Nml Nml Nml Nml Nml Nml N/A      Waveforms:

## 2020-11-11 ENCOUNTER — Other Ambulatory Visit: Payer: Self-pay | Admitting: Nurse Practitioner

## 2020-11-11 ENCOUNTER — Ambulatory Visit (HOSPITAL_COMMUNITY)
Admission: RE | Admit: 2020-11-11 | Discharge: 2020-11-11 | Disposition: A | Discharge status: Home / Self Care | Payer: Medicaid Other | Source: Ambulatory Visit | Attending: Physician Assistant | Admitting: Physician Assistant

## 2020-11-11 ENCOUNTER — Ambulatory Visit (HOSPITAL_COMMUNITY): Payer: Medicaid Other

## 2020-11-11 ENCOUNTER — Encounter (HOSPITAL_COMMUNITY): Payer: Self-pay

## 2020-11-11 ENCOUNTER — Ambulatory Visit: Admit: 2020-11-11 | Payer: Medicaid Other

## 2020-11-11 DIAGNOSIS — E119 Type 2 diabetes mellitus without complications: Secondary | ICD-10-CM

## 2020-11-11 DIAGNOSIS — Z794 Long term (current) use of insulin: Secondary | ICD-10-CM

## 2020-11-11 SURGERY — MRI WITH ANESTHESIA
Anesthesia: General | Laterality: Bilateral

## 2020-11-11 NOTE — Telephone Encounter (Signed)
Please see patient's refill request 

## 2020-11-12 ENCOUNTER — Ambulatory Visit (HOSPITAL_COMMUNITY)
Admission: EM | Admit: 2020-11-12 | Discharge: 2020-11-12 | Disposition: A | Discharge status: Home / Self Care | Payer: Medicaid Other | Attending: Family Medicine | Admitting: Family Medicine

## 2020-11-12 ENCOUNTER — Other Ambulatory Visit: Payer: Self-pay

## 2020-11-12 ENCOUNTER — Encounter (HOSPITAL_COMMUNITY): Payer: Self-pay

## 2020-11-12 ENCOUNTER — Ambulatory Visit (INDEPENDENT_AMBULATORY_CARE_PROVIDER_SITE_OTHER): Payer: Medicaid Other

## 2020-11-12 DIAGNOSIS — Z20822 Contact with and (suspected) exposure to covid-19: Secondary | ICD-10-CM

## 2020-11-12 DIAGNOSIS — U071 COVID-19: Secondary | ICD-10-CM | POA: Insufficient documentation

## 2020-11-12 DIAGNOSIS — R000 Tachycardia, unspecified: Secondary | ICD-10-CM | POA: Diagnosis not present

## 2020-11-12 DIAGNOSIS — R0602 Shortness of breath: Secondary | ICD-10-CM | POA: Diagnosis not present

## 2020-11-12 DIAGNOSIS — R350 Frequency of micturition: Secondary | ICD-10-CM

## 2020-11-12 DIAGNOSIS — Z87891 Personal history of nicotine dependence: Secondary | ICD-10-CM | POA: Insufficient documentation

## 2020-11-12 LAB — COMPREHENSIVE METABOLIC PANEL
ALT: 13 U/L (ref 0–44)
AST: 35 U/L (ref 15–41)
Albumin: 3.5 g/dL (ref 3.5–5.0)
Alkaline Phosphatase: 87 U/L (ref 38–126)
Anion gap: 13 (ref 5–15)
BUN: 10 mg/dL (ref 6–20)
CO2: 20 mmol/L — ABNORMAL LOW (ref 22–32)
Calcium: 9.4 mg/dL (ref 8.9–10.3)
Chloride: 100 mmol/L (ref 98–111)
Creatinine, Ser: 1.24 mg/dL — ABNORMAL HIGH (ref 0.44–1.00)
GFR, Estimated: 55 mL/min — ABNORMAL LOW (ref >60)
Glucose, Bld: 222 mg/dL — ABNORMAL HIGH (ref 70–99)
Potassium: 4.6 mmol/L (ref 3.5–5.1)
Sodium: 133 mmol/L — ABNORMAL LOW (ref 135–145)
Total Bilirubin: 0.5 mg/dL (ref 0.3–1.2)
Total Protein: 7.5 g/dL (ref 6.5–8.1)

## 2020-11-12 LAB — CBC WITH DIFFERENTIAL/PLATELET
Abs Immature Granulocytes: 0.03 10*3/uL (ref 0.00–0.07)
Basophils Absolute: 0 10*3/uL (ref 0.0–0.1)
Basophils Relative: 1 %
Eosinophils Absolute: 0.1 10*3/uL (ref 0.0–0.5)
Eosinophils Relative: 1 %
HCT: 33.9 % — ABNORMAL LOW (ref 36.0–46.0)
Hemoglobin: 10.8 g/dL — ABNORMAL LOW (ref 12.0–15.0)
Immature Granulocytes: 1 %
Lymphocytes Relative: 23 %
Lymphs Abs: 1.5 10*3/uL (ref 0.7–4.0)
MCH: 22 pg — ABNORMAL LOW (ref 26.0–34.0)
MCHC: 31.9 g/dL (ref 30.0–36.0)
MCV: 68.9 fL — ABNORMAL LOW (ref 80.0–100.0)
Monocytes Absolute: 0.4 10*3/uL (ref 0.1–1.0)
Monocytes Relative: 6 %
Neutro Abs: 4.6 10*3/uL (ref 1.7–7.7)
Neutrophils Relative %: 68 %
Platelets: 318 10*3/uL (ref 150–400)
RBC: 4.92 MIL/uL (ref 3.87–5.11)
RDW: 23.4 % — ABNORMAL HIGH (ref 11.5–15.5)
WBC: 6.6 10*3/uL (ref 4.0–10.5)
nRBC: 0 % (ref 0.0–0.2)

## 2020-11-12 LAB — POCT URINALYSIS DIPSTICK, ED / UC
Bilirubin Urine: NEGATIVE
Glucose, UA: NEGATIVE mg/dL
Ketones, ur: NEGATIVE mg/dL
Nitrite: NEGATIVE
Protein, ur: 100 mg/dL — AB
Specific Gravity, Urine: 1.025 (ref 1.005–1.030)
Urobilinogen, UA: 0.2 mg/dL (ref 0.0–1.0)
pH: 5.5 (ref 5.0–8.0)

## 2020-11-12 LAB — CBG MONITORING, ED: Glucose-Capillary: 247 mg/dL — ABNORMAL HIGH (ref 70–99)

## 2020-11-12 MED ORDER — ALBUTEROL SULFATE HFA 108 (90 BASE) MCG/ACT IN AERS
INHALATION_SPRAY | RESPIRATORY_TRACT | Status: AC
  Filled 2020-11-12: qty 6.7

## 2020-11-12 MED ORDER — ONDANSETRON 4 MG PO TBDP
4.0000 mg | ORAL_TABLET | Freq: Once | ORAL | Status: AC
  Administered 2020-11-12: 4 mg via ORAL

## 2020-11-12 MED ORDER — FLUCONAZOLE 150 MG PO TABS: 150.0000 mg | ORAL_TABLET | Freq: Every day | ORAL | 0 refills | Status: DC

## 2020-11-12 MED ORDER — ALBUTEROL SULFATE HFA 108 (90 BASE) MCG/ACT IN AERS
2.0000 | INHALATION_SPRAY | Freq: Once | RESPIRATORY_TRACT | Status: AC
  Administered 2020-11-12: 2 via RESPIRATORY_TRACT

## 2020-11-12 MED ORDER — ONDANSETRON 4 MG PO TBDP
ORAL_TABLET | ORAL | Status: AC
  Filled 2020-11-12: qty 1

## 2020-11-12 NOTE — Discharge Instructions (Addendum)
Your x ray was normal No concerns for pneumonia EKG with fast heart rate otherwise normal Drink plenty of water. You may be dehydrated.  Covid test pending.  Watch your sugars. If your breathing worsens please go to the ER.

## 2020-11-12 NOTE — ED Triage Notes (Signed)
Pt present covid exposure from family members and Urinary frequency. Pt states she being to the bathroom more frequently then normal.

## 2020-11-12 NOTE — ED Notes (Signed)
Pt called staff to tx room and stated she was nauseated and wants to go home. V/S taken and status update given to T. Bast provider and orders for zofran received.

## 2020-11-13 LAB — URINE CULTURE: Culture: <10000 — AB

## 2020-11-13 LAB — SARS CORONAVIRUS 2 (TAT 6-24 HRS): SARS Coronavirus 2: POSITIVE — AB

## 2020-11-14 ENCOUNTER — Telehealth: Payer: Self-pay | Admitting: Unknown Physician Specialty

## 2020-11-14 NOTE — Telephone Encounter (Signed)
Called to discuss with patient about COVID-19 symptoms and the use of one of the available treatments for those with mild to moderate Covid symptoms and at a high risk of hospitalization.  Pt appears to qualify for outpatient treatment due to co-morbid conditions and/or a member of an at-risk group in accordance with the FDA Emergency Use Authorization.     Unable to reach pt - LMOM  Dayle Sherpa   

## 2020-11-14 NOTE — ED Provider Notes (Signed)
[facility]    CSN: [id number] Arrival date & time: 11/12/20  1054      History   Chief Complaint Chief Complaint  Patient presents with  . Covid Exposure  . Urinary Frequency         HPI Jill Shaw is a 45 y.o. female.   Patient is a 45 year old female presents today with complaints of fever, cough, mild shortness of breath.  Symptoms have been constant for the past few days.  COVID exposure from family members.  Tachycardic today.  Reported she has baseline tachycardic.  Tachypneic.  No history of DVT or PE.  No calf pain or swelling.  No recent long distance traveling.  Also having some complaints of urinary frequency.  Patient is a diabetic.  She has been having some itching and irritation to the vaginal area. Hx or recurrent yeast infections.      Past Medical History:  Diagnosis Date  . Anemia   . Arthritis    knees, hands  . Asthma   . Chronic diastolic (congestive) heart failure ([place])   . COPD (chronic obstructive pulmonary disease) ([place])   . Diabetes mellitus without complication ([place])    type 2  . Dysfunctional uterine bleeding   . GERD (gastroesophageal reflux disease)   . Hypertension   . Neuromuscular disorder (HCC)    neuropathy feet  . Seizures ([place]) 09/12/2017   pt states r/t stress and blood sugar - no meds last one 4 months ago, not seen neurologist  . Sickle cell trait ([place])   . Smoker   . Vitamin D deficiency 10/2019  . Wears glasses     Patient Active Problem List   Diagnosis Date Noted  . DKA (diabetic ketoacidosis) ([place]) 10/15/2020  . Hyperkalemia 10/15/2020  . Severe sepsis ([place]) 10/15/2020  . Acute metabolic encephalopathy 96/29/5284  . Sinus tachycardia 10/14/2020  . Precordial pain 10/14/2020  . Abnormal findings on diagnostic imaging of lung 08/19/2020  . Physical deconditioning 08/19/2020  . History of endometrial ablation 07/27/2020  . History of alcohol use 07/20/2020  . Current moderate episode of major  depressive disorder without prior episode ([place]) 07/20/2020  . Sickle cell trait ([place]) 07/19/2020  . Elevated d-dimer 07/13/2020  . Healthcare maintenance 07/12/2020  . Exertional dyspnea 07/12/2020  . Atypical chest pain 07/12/2020  . At risk for obstructive sleep apnea 07/12/2020  . DKA (diabetic ketoacidoses) 07/02/2020  . Acute on chronic diastolic (congestive) heart failure ([place]) 06/22/2020  . Abdominal pain 03/08/2020  . Chronic diastolic CHF (congestive heart failure) ([place]) 03/08/2020  . AKI (acute kidney injury) ([place]) 01/29/2020  . Symptomatic anemia 12/19/2019  . Elevated sed rate 11/26/2019  . Elevated C-reactive protein (CRP) 11/26/2019  . Hypoglycemia 02/07/2019  . Hypotension 02/07/2019  . Lactic acidosis 02/07/2019  . Right ankle pain 02/07/2019  . Chronic obstructive pulmonary disease ([place]) 02/05/2019  . Seizure ([place]) 07/31/2018  . Vitamin D deficiency 05/31/2017  . Gastroesophageal reflux disease 05/24/2017  . Abnormal uterine bleeding (AUB) 05/17/2017  . Anemia of chronic disease 04/06/2017  . Knee pain, chronic 03/27/2016  . Controlled type 2 diabetes mellitus without complication, with long-term current use of insulin ([place]) 03/27/2016  . Essential hypertension 03/27/2016  . Morbid obesity ([place]) 03/27/2016  . Irritable bowel syndrome with constipation 03/27/2016  . Tobacco dependence 03/27/2016  . CKD (chronic kidney disease) stage 2, GFR 60-89 ml/min 03/25/2016  . Arthropathy, lower leg 05/04/2013  . CTS (carpal tunnel syndrome) 05/04/2013  . Peripheral edema  05/04/2013  . Chronic pain 05/13/2012  . Diabetic gastroparesis ([place]) 11/06/2006  . Hot flashes 11/06/2006    Past Surgical History:  Procedure Laterality Date  . CESAREAN SECTION     x 1. for twins  . DILATION AND CURETTAGE OF UTERUS N/A 08/20/2019   Procedure: DILATATION AND CURETTAGE;  Surgeon: Emily Filbert, MD;  Location: [facility];  Service: Gynecology;  Laterality: N/A;  . ENDOMETRIAL ABLATION N/A  08/20/2019   Procedure: [place] Ablation;  Surgeon: Emily Filbert, MD;  Location: [facility];  Service: Gynecology;  Laterality: N/A;  . EYE SURGERY Bilateral    laser right and cataract removed left eye  . RADIOLOGY WITH ANESTHESIA N/A 09/16/2019   Procedure: MRI WITH ANESTHESIA   L SPINE WITHOUT CONTRAST, T SPINE WITHOUT CONTRAST , CERVICAL WITHOUT CONTRAST;  Surgeon: Radiologist, Medication, MD;  Location: [facility];  Service: Radiology;  Laterality: N/A;  . TUBAL LIGATION     interval BTL  . UPPER GI ENDOSCOPY  07/2017    OB History    Gravida  3   Para      Term      Preterm      AB      Living  3     SAB      IAB      Ectopic      Multiple      Live Births  3        Obstetric Comments  Vaginal x 1 and then c-section for twins         Home Medications    Prior to Admission medications   Medication Sig Start Date End Date Taking? Authorizing Provider  fluconazole (DIFLUCAN) 150 MG tablet Take 1 tablet (150 mg total) by mouth daily. Take one tab now and one in 3 days if still having symptoms 11/12/20  Yes Maddison Kilner A, NP  ACCU-CHEK GUIDE test strip USE AS DIRECTED UP TO FOUR TIMES DAILY 11/11/20   Vevelyn Francois, NP  albuterol (PROVENTIL) (2.5 MG/3ML) 0.083% nebulizer solution Take 3 mLs (2.5 mg total) by nebulization every 6 (six) hours as needed for wheezing or shortness of breath. 09/27/20   Icard, Octavio Graves, DO  albuterol (VENTOLIN HFA) 108 (90 Base) MCG/ACT inhaler Inhale 2 puffs into the lungs every 6 (six) hours as needed for wheezing or shortness of breath. 09/27/20   Icard, Octavio Graves, DO  amitriptyline (ELAVIL) 75 MG tablet Take 1 tablet (75 mg total) by mouth at bedtime. 10/13/20   Raulkar, Clide Deutscher, MD  blood glucose meter kit and supplies KIT 1 each by Other route See admin instructions. Dispense based on patient and insurance preference. Use up to four times daily as directed. (FOR ICD-9 250.00, 250.01). 09/02/20   Vevelyn Francois, NP  Blood Glucose  Monitoring Suppl (TRUE METRIX METER) w/Device KIT 1 each by Does not apply route 4 (four) times daily -  before meals and at bedtime. 01/21/18   Dorena Dew, FNP  budesonide-formoterol (SYMBICORT) 160-4.5 MCG/ACT inhaler INHALE 2 PUFFS INTO THE LUNGS 2 (TWO) TIMES DAILY. Patient taking differently: Inhale 2 puffs into the lungs 2 (two) times daily. 09/27/20   Icard, Octavio Graves, DO  buPROPion (WELLBUTRIN SR) 150 MG 12 hr tablet Take 1 tablet (150 mg total) by mouth 2 (two) times daily. 07/19/20 07/19/21  Vevelyn Francois, NP  cetirizine (ZYRTEC) 10 MG tablet Take 1 tablet (10 mg total) by mouth daily. 10/07/20   Vevelyn Francois,  NP  Continuous Blood Gluc Receiver (FREESTYLE LIBRE 14 DAY READER) DEVI 1 application by Does not apply route every 14 (fourteen) days. 08/13/20 08/13/21  Vevelyn Francois, NP  diclofenac Sodium (VOLTAREN) 1 % GEL Apply 4 g topically 4 (four) times daily as needed (pain). 08/15/20   [provider]  ferrous sulfate 325 (65 FE) MG tablet Take 1 tablet (325 mg total) by mouth 3 (three) times daily with meals. Patient taking differently: Take 325 mg by mouth daily with breakfast. 12/02/19   Vevelyn Francois, NP  fluticasone (FLONASE) 50 MCG/ACT nasal spray Place 2 sprays into both nostrils daily. 11/24/19   Vevelyn Francois, NP  furosemide (LASIX) 40 MG tablet Take 1 tablet (40 mg total) by mouth 2 (two) times daily. 08/11/20   Lorretta Harp, MD  Glucosamine Sulfate 1000 MG CAPS Take 1 capsule (1,000 mg total) by mouth 2 (two) times daily. Patient taking differently: Take 1,000 mg by mouth 2 (two) times daily. 08/22/19   Hilts, Legrand Como, MD  hydrocortisone 2.5 % cream Apply topically 2 (two) times daily. Patient taking differently: Apply 1 application topically 2 (two) times daily. 09/22/20   Vevelyn Francois, NP  HYDROmorphone (DILAUDID) 2 MG tablet Take 1 tablet (2 mg total) by mouth every 8 (eight) hours as needed for severe pain. 10/27/20   Aletha Halim, MD   hydroquinone 4 % cream APPLY TOPICALLY TWICE DAILY Patient taking differently: Apply 1 application topically 2 (two) times daily. 09/13/20   Vevelyn Francois, NP  Insulin Lispro Prot & Lispro (HUMALOG MIX 75/25 KWIKPEN) (75-25) 100 UNIT/ML Kwikpen Inject 120 Units into the skin 2 (two) times daily. INJECT 110 UNITS EVERY 12 HOURS Patient taking differently: Inject 110 Units into the skin 2 (two) times daily. 09/13/20 09/13/21  Vevelyn Francois, NP  Insulin Pen Needle (PEN NEEDLES) 30G X 5 MM MISC 1 Units by Does not apply route as directed. 12/02/19   Vevelyn Francois, NP  lidocaine (XYLOCAINE) 5 % ointment Apply 1 application topically as needed. Patient taking differently: Apply 1 application topically as needed for mild pain. 09/16/20   Patel, Arvin Collard K, DO  losartan (COZAAR) 50 MG tablet TAKE 1 TABLET(50MG TOTAL) BY MOUTH EVERY DAY 11/08/20   Vevelyn Francois, NP  metoCLOPramide (REGLAN) 10 MG tablet Take 1 tablet (10 mg total) by mouth 4 (four) times daily -  before meals and at bedtime. 10/17/20 11/16/20  Donne Hazel, MD  metoprolol succinate (TOPROL-XL) 200 MG 24 hr tablet Take 1 tablet (200 mg total) by mouth daily. Take with or immediately following a meal. Patient taking differently: Take 200 mg by mouth daily. 10/14/20   Patwardhan, Reynold Bowen, MD  nicotine (NICODERM CQ) 21 mg/24hr patch Place 1 patch (21 mg total) onto the skin daily. 07/12/20   Lauraine Rinne, NP  norethindrone (AYGESTIN) 5 MG tablet Take 2 tablets (10 mg total) by mouth in the morning, at noon, in the evening, and at bedtime. With bleeding 10/19/20   Aletha Halim, MD  omeprazole (PRILOSEC) 40 MG capsule TAKE 1 CAPSULE(40MG TOTAL) BY MOUTH EVERY DAY Patient taking differently: Take 40 mg by mouth daily. 09/23/20   Vevelyn Francois, NP  promethazine (PHENERGAN) 12.5 MG tablet Take 1 tablet (12.5 mg total) by mouth every 6 (six) hours as needed for nausea or vomiting. 03/10/20   Patrecia Pour, MD  rosuvastatin (CRESTOR) 5 MG tablet  Take 1 tablet (5 mg total) by mouth daily. 08/11/20  Lorretta Harp, MD  saxagliptin HCl (ONGLYZA) 2.5 MG TABS tablet Take 1 tablet (2.5 mg total) by mouth daily. 12/02/19   Vevelyn Francois, NP  spironolactone (ALDACTONE) 50 MG tablet Take 1 tablet (50 mg total) by mouth daily. 09/01/20 11/30/20  Patwardhan, Reynold Bowen, MD  sucralfate (CARAFATE) 1 GM/10ML suspension Take 10 mLs (1 g total) by mouth 4 (four) times daily -  with meals and at bedtime. 07/04/20   Bonnielee Haff, MD  Tiotropium Bromide Monohydrate (SPIRIVA RESPIMAT) 2.5 MCG/ACT AERS Inhale 2 puffs into the lungs daily. 09/27/20   Icard, Octavio Graves, DO  tiZANidine (ZANAFLEX) 4 MG tablet Take 1 tablet (4 mg total) by mouth 3 (three) times daily. 11/11/20   Vevelyn Francois, NP  TRUEplus Lancets 28G MISC 1 each by Other route in the morning, at noon, in the evening, and at bedtime. 02/12/20   [provider]  Turmeric 500 MG CAPS Take 500 mg by mouth 2 (two) times daily. 08/22/19   Hilts, Legrand Como, MD  Vitamin D, Ergocalciferol, (DRISDOL) 1.25 MG (50000 UNIT) CAPS capsule Take 1 capsule (50,000 Units total) by mouth every 7 (seven) days. Patient taking differently: Take 50,000 Units by mouth every Friday. 12/02/19   Vevelyn Francois, NP  hydrALAZINE (APRESOLINE) 50 MG tablet Take 1 tablet (50 mg total) by mouth 3 (three) times daily. 02/03/20 02/20/20  Thurnell Lose, MD    Family History Family History  Problem Relation Age of Onset  . Diabetes Mother   . Hypertension Mother     Social History Social History   Tobacco Use  . Smoking status: Former Smoker    Packs/day: 0.25    Years: 26.00    Pack years: 6.50    Types: Cigarettes    Quit date: 09/13/2020    Years since quitting: 0.1  . Smokeless tobacco: Never Used  . Tobacco comment: 4-5 cigarettes/day  Vaping Use  . Vaping Use: Never used  Substance Use Topics  . Alcohol use: No  . Drug use: No     Allergies   Ketoprofen, Aspirin, Gabapentin, Ibuprofen,  Liraglutide, Naproxen, Omeprazole-sodium bicarbonate, Sulfa antibiotics, and Tramadol   Review of Systems Review of Systems   Physical Exam Triage Vital Signs ED Triage Vitals  Enc Vitals Group     BP 11/12/20 1128 (!) 158/69     Pulse Rate 11/12/20 1128 (!) 128     Resp 11/12/20 1128 20     Temp 11/12/20 1128 99.3 F (37.4 C)     Temp Source 11/12/20 1128 Oral     SpO2 11/12/20 1128 100 %     Weight --      Height --      Head Circumference --      Peak Flow --      Pain Score 11/12/20 1129 0     Pain Loc --      Pain Edu? --      Excl. in [place]? --    No data found.  Updated Vital Signs BP (!) 158/100 (BP Location: Right Arm)   Pulse (!) 131   Temp 99.3 F (37.4 C) (Oral)   Resp (!) 32   SpO2 99%   Visual Acuity Right Eye Distance:   Left Eye Distance:   Bilateral Distance:    Right Eye Near:   Left Eye Near:    Bilateral Near:     Physical Exam Vitals and nursing note reviewed.  Constitutional:  General: She is not in acute distress.    Appearance: Normal appearance. She is not ill-appearing, toxic-appearing or diaphoretic.  HENT:     Head: Normocephalic.     Mouth/Throat:     Pharynx: Oropharynx is clear.  Eyes:     Conjunctiva/sclera: Conjunctivae normal.  Cardiovascular:     Rate and Rhythm: Tachycardia present.  Pulmonary:     Effort: Pulmonary effort is normal. Tachypnea present.     Breath sounds: Normal breath sounds.  Musculoskeletal:        General: Normal range of motion.     Cervical back: Normal range of motion.  Skin:    General: Skin is warm and dry.     Findings: No rash.  Neurological:     Mental Status: She is alert.  Psychiatric:        Mood and Affect: Mood normal.      UC Treatments / Results  Labs (all labs ordered are listed, but only abnormal results are displayed) Labs Reviewed  URINE CULTURE - Abnormal; Notable for the following components:      Result Value   Culture   (*)    Value: <10,000 COLONIES/mL  INSIGNIFICANT GROWTH Performed at [facility], [address], [place], [place] [address]    All other components within normal limits  SARS CORONAVIRUS 2 (TAT 6-24 HRS) - Abnormal; Notable for the following components:   SARS Coronavirus 2 POSITIVE (*)    All other components within normal limits  CBC WITH DIFFERENTIAL/PLATELET - Abnormal; Notable for the following components:   Hemoglobin 10.8 (*)    HCT 33.9 (*)    MCV 68.9 (*)    MCH 22.0 (*)    RDW 23.4 (*)    All other components within normal limits  COMPREHENSIVE METABOLIC PANEL - Abnormal; Notable for the following components:   Sodium 133 (*)    CO2 20 (*)    Glucose, Bld 222 (*)    Creatinine, Ser 1.24 (*)    GFR, Estimated 55 (*)    All other components within normal limits  POCT URINALYSIS DIPSTICK, ED / UC - Abnormal; Notable for the following components:   Hgb urine dipstick LARGE (*)    Protein, ur 100 (*)    Leukocytes,Ua SMALL (*)    All other components within normal limits  CBG MONITORING, ED - Abnormal; Notable for the following components:   Glucose-Capillary 247 (*)    All other components within normal limits    EKG   Radiology DG Chest 2 View  Result Date: 11/12/2020 CLINICAL DATA:  Shortness of breath.  COVID exposure. EXAM: CHEST - 2 VIEW COMPARISON:  10/15/2020 FINDINGS: Cardiopericardial silhouette is at upper limits of normal for size. Chronic atelectasis or scarring noted right parahilar lung, stable since prior. Streaky bibasilar opacity likely atelectatic. No edema or focal airspace consolidation. No pleural effusion. The visualized bony structures of the thorax show no acute abnormality. IMPRESSION: 1. Chronic atelectasis or scarring in the right mid lung. 2. Streaky bibasilar opacity likely atelectatic. Electronically Signed   By: Misty Stanley M.D.   On: 11/12/2020 12:52    Procedures Procedures (including critical care time)  Medications Ordered in UC Medications  ondansetron  (ZOFRAN-ODT) disintegrating tablet 4 mg (4 mg Oral Given 11/12/20 1333)  albuterol (VENTOLIN HFA) 108 (90 Base) MCG/ACT inhaler 2 puff (2 puffs Inhalation Given 11/12/20 1343)    Initial Impression / Assessment and Plan / UC Course  I have reviewed the  triage vital signs and the nursing notes.  Pertinent labs & imaging results that were available during my care of the patient were reviewed by me and considered in my medical decision making (see chart for details).  Clinical Course as of 11/14/20 1018  Sun Nov 14, 2020  1014 Urine Culture(!) [TB]    Clinical Course User Index [TB] Orvan July, NP    Tachycardia-highly suspicious for Covid.  No concerns on exam.  She is mildly tachypneic and tachycardic but oxygen saturations are normal.  Patient has baseline mild tachycardia anyways.  Lungs clear. No concerns for PE today. Covid swab pending.  EKG with sinus tachycardia and nonspecific T wave abnormality. No concern for ACS at this time. CBG 247. Urine with small leuks and large hemoglobin.  Sending for culture. Chest x-ray without any acute findings. Heart rate decreased to 115 upon discharge. Albuterol given here in clinic to help with breathing. Strict ER and return precautions given  Final Clinical Impressions(s) / UC Diagnoses   Final diagnoses:  Tachycardia     Discharge Instructions     Your x ray was normal No concerns for pneumonia EKG with fast heart rate otherwise normal Drink plenty of water. You may be dehydrated.  Covid test pending.  Watch your sugars. If your breathing worsens please go to the ER.     ED Prescriptions    Medication Sig Dispense Auth. Provider   fluconazole (DIFLUCAN) 150 MG tablet Take 1 tablet (150 mg total) by mouth daily. Take one tab now and one in 3 days if still having symptoms 2 tablet Rozanna Box, Marlina Cataldi A, NP     PDMP not reviewed this encounter.   Loura Halt A, NP 11/14/20 1018

## 2020-11-16 ENCOUNTER — Institutional Professional Consult (permissible substitution): Payer: Medicaid Other | Admitting: Pulmonary Disease

## 2020-11-16 ENCOUNTER — Other Ambulatory Visit: Payer: Self-pay | Admitting: Obstetrics and Gynecology

## 2020-11-16 DIAGNOSIS — N939 Abnormal uterine and vaginal bleeding, unspecified: Secondary | ICD-10-CM

## 2020-11-17 ENCOUNTER — Ambulatory Visit: Payer: Medicaid Other | Admitting: Internal Medicine

## 2020-11-19 ENCOUNTER — Other Ambulatory Visit: Payer: Self-pay | Admitting: Nurse Practitioner

## 2020-11-19 ENCOUNTER — Other Ambulatory Visit: Payer: Self-pay | Admitting: Physical Medicine and Rehabilitation

## 2020-11-19 MED ORDER — ROPINIROLE HCL 0.25 MG PO TABS: 0.2500 mg | ORAL_TABLET | Freq: Three times a day (TID) | ORAL | 2 refills | Status: DC

## 2020-11-19 NOTE — Telephone Encounter (Signed)
Is this okay to refill? 

## 2020-11-20 ENCOUNTER — Emergency Department (HOSPITAL_COMMUNITY): Payer: Medicaid Other

## 2020-11-20 ENCOUNTER — Inpatient Hospital Stay (HOSPITAL_COMMUNITY)
Admission: EM | Admit: 2020-11-20 | Discharge: 2020-11-23 | DRG: 077 | Disposition: A | Discharge status: Home / Self Care | Payer: Medicaid Other | Attending: Internal Medicine | Admitting: Internal Medicine

## 2020-11-20 ENCOUNTER — Encounter (HOSPITAL_COMMUNITY): Payer: Self-pay | Admitting: Emergency Medicine

## 2020-11-20 ENCOUNTER — Other Ambulatory Visit: Payer: Self-pay

## 2020-11-20 DIAGNOSIS — R55 Syncope and collapse: Secondary | ICD-10-CM | POA: Diagnosis present

## 2020-11-20 DIAGNOSIS — D500 Iron deficiency anemia secondary to blood loss (chronic): Secondary | ICD-10-CM | POA: Diagnosis present

## 2020-11-20 DIAGNOSIS — R0902 Hypoxemia: Secondary | ICD-10-CM | POA: Diagnosis not present

## 2020-11-20 DIAGNOSIS — E785 Hyperlipidemia, unspecified: Secondary | ICD-10-CM | POA: Diagnosis not present

## 2020-11-20 DIAGNOSIS — I130 Hypertensive heart and chronic kidney disease with heart failure and stage 1 through stage 4 chronic kidney disease, or unspecified chronic kidney disease: Secondary | ICD-10-CM | POA: Diagnosis not present

## 2020-11-20 DIAGNOSIS — I674 Hypertensive encephalopathy: Secondary | ICD-10-CM | POA: Diagnosis not present

## 2020-11-20 DIAGNOSIS — R6510 Systemic inflammatory response syndrome (SIRS) of non-infectious origin without acute organ dysfunction: Secondary | ICD-10-CM | POA: Diagnosis present

## 2020-11-20 DIAGNOSIS — E1110 Type 2 diabetes mellitus with ketoacidosis without coma: Secondary | ICD-10-CM

## 2020-11-20 DIAGNOSIS — R0689 Other abnormalities of breathing: Secondary | ICD-10-CM | POA: Diagnosis not present

## 2020-11-20 DIAGNOSIS — I5032 Chronic diastolic (congestive) heart failure: Secondary | ICD-10-CM | POA: Diagnosis not present

## 2020-11-20 DIAGNOSIS — G894 Chronic pain syndrome: Secondary | ICD-10-CM | POA: Diagnosis not present

## 2020-11-20 DIAGNOSIS — A419 Sepsis, unspecified organism: Secondary | ICD-10-CM | POA: Diagnosis not present

## 2020-11-20 DIAGNOSIS — N1831 Chronic kidney disease, stage 3a: Secondary | ICD-10-CM | POA: Diagnosis present

## 2020-11-20 DIAGNOSIS — K219 Gastro-esophageal reflux disease without esophagitis: Secondary | ICD-10-CM | POA: Diagnosis present

## 2020-11-20 DIAGNOSIS — U071 COVID-19: Secondary | ICD-10-CM | POA: Diagnosis not present

## 2020-11-20 DIAGNOSIS — D573 Sickle-cell trait: Secondary | ICD-10-CM | POA: Diagnosis present

## 2020-11-20 DIAGNOSIS — J9811 Atelectasis: Secondary | ICD-10-CM | POA: Diagnosis not present

## 2020-11-20 DIAGNOSIS — E1140 Type 2 diabetes mellitus with diabetic neuropathy, unspecified: Secondary | ICD-10-CM | POA: Diagnosis not present

## 2020-11-20 DIAGNOSIS — G9341 Metabolic encephalopathy: Secondary | ICD-10-CM | POA: Diagnosis present

## 2020-11-20 DIAGNOSIS — Z794 Long term (current) use of insulin: Secondary | ICD-10-CM

## 2020-11-20 DIAGNOSIS — E669 Obesity, unspecified: Secondary | ICD-10-CM | POA: Diagnosis present

## 2020-11-20 DIAGNOSIS — Z87891 Personal history of nicotine dependence: Secondary | ICD-10-CM

## 2020-11-20 DIAGNOSIS — Z833 Family history of diabetes mellitus: Secondary | ICD-10-CM

## 2020-11-20 DIAGNOSIS — I160 Hypertensive urgency: Secondary | ICD-10-CM | POA: Diagnosis not present

## 2020-11-20 DIAGNOSIS — R4182 Altered mental status, unspecified: Secondary | ICD-10-CM

## 2020-11-20 DIAGNOSIS — Z888 Allergy status to other drugs, medicaments and biological substances status: Secondary | ICD-10-CM

## 2020-11-20 DIAGNOSIS — R000 Tachycardia, unspecified: Secondary | ICD-10-CM | POA: Diagnosis not present

## 2020-11-20 DIAGNOSIS — Z886 Allergy status to analgesic agent status: Secondary | ICD-10-CM

## 2020-11-20 DIAGNOSIS — Z6841 Body Mass Index (BMI) 40.0 and over, adult: Secondary | ICD-10-CM

## 2020-11-20 DIAGNOSIS — I10 Essential (primary) hypertension: Secondary | ICD-10-CM | POA: Diagnosis not present

## 2020-11-20 DIAGNOSIS — G4733 Obstructive sleep apnea (adult) (pediatric): Secondary | ICD-10-CM | POA: Diagnosis not present

## 2020-11-20 DIAGNOSIS — Z882 Allergy status to sulfonamides status: Secondary | ICD-10-CM

## 2020-11-20 DIAGNOSIS — J811 Chronic pulmonary edema: Secondary | ICD-10-CM | POA: Diagnosis not present

## 2020-11-20 DIAGNOSIS — E1122 Type 2 diabetes mellitus with diabetic chronic kidney disease: Secondary | ICD-10-CM | POA: Diagnosis present

## 2020-11-20 DIAGNOSIS — J449 Chronic obstructive pulmonary disease, unspecified: Secondary | ICD-10-CM | POA: Diagnosis present

## 2020-11-20 DIAGNOSIS — R404 Transient alteration of awareness: Secondary | ICD-10-CM | POA: Diagnosis not present

## 2020-11-20 DIAGNOSIS — M1990 Unspecified osteoarthritis, unspecified site: Secondary | ICD-10-CM | POA: Diagnosis present

## 2020-11-20 DIAGNOSIS — I2699 Other pulmonary embolism without acute cor pulmonale: Secondary | ICD-10-CM | POA: Diagnosis not present

## 2020-11-20 DIAGNOSIS — Z7951 Long term (current) use of inhaled steroids: Secondary | ICD-10-CM

## 2020-11-20 DIAGNOSIS — G2581 Restless legs syndrome: Secondary | ICD-10-CM | POA: Diagnosis present

## 2020-11-20 DIAGNOSIS — Z8249 Family history of ischemic heart disease and other diseases of the circulatory system: Secondary | ICD-10-CM

## 2020-11-20 DIAGNOSIS — D631 Anemia in chronic kidney disease: Secondary | ICD-10-CM | POA: Diagnosis not present

## 2020-11-20 DIAGNOSIS — I517 Cardiomegaly: Secondary | ICD-10-CM | POA: Diagnosis not present

## 2020-11-20 DIAGNOSIS — Z79899 Other long term (current) drug therapy: Secondary | ICD-10-CM

## 2020-11-20 DIAGNOSIS — Z885 Allergy status to narcotic agent status: Secondary | ICD-10-CM

## 2020-11-20 LAB — CBC WITH DIFFERENTIAL/PLATELET
Abs Immature Granulocytes: 0.05 10*3/uL (ref 0.00–0.07)
Basophils Absolute: 0.1 10*3/uL (ref 0.0–0.1)
Basophils Relative: 1 %
Eosinophils Absolute: 0 10*3/uL (ref 0.0–0.5)
Eosinophils Relative: 0 %
HCT: 33 % — ABNORMAL LOW (ref 36.0–46.0)
Hemoglobin: 9.9 g/dL — ABNORMAL LOW (ref 12.0–15.0)
Immature Granulocytes: 1 %
Lymphocytes Relative: 15 %
Lymphs Abs: 1.6 10*3/uL (ref 0.7–4.0)
MCH: 21.8 pg — ABNORMAL LOW (ref 26.0–34.0)
MCHC: 30 g/dL (ref 30.0–36.0)
MCV: 72.7 fL — ABNORMAL LOW (ref 80.0–100.0)
Monocytes Absolute: 0.7 10*3/uL (ref 0.1–1.0)
Monocytes Relative: 7 %
Neutro Abs: 8.3 10*3/uL — ABNORMAL HIGH (ref 1.7–7.7)
Neutrophils Relative %: 76 %
Platelets: 236 10*3/uL (ref 150–400)
RBC: 4.54 MIL/uL (ref 3.87–5.11)
RDW: 23.3 % — ABNORMAL HIGH (ref 11.5–15.5)
WBC: 10.8 10*3/uL — ABNORMAL HIGH (ref 4.0–10.5)
nRBC: 0 % (ref 0.0–0.2)

## 2020-11-20 LAB — BASIC METABOLIC PANEL
Anion gap: 12 (ref 5–15)
BUN: 15 mg/dL (ref 6–20)
CO2: 24 mmol/L (ref 22–32)
Calcium: 9.1 mg/dL (ref 8.9–10.3)
Chloride: 107 mmol/L (ref 98–111)
Creatinine, Ser: 1.25 mg/dL — ABNORMAL HIGH (ref 0.44–1.00)
GFR, Estimated: 55 mL/min — ABNORMAL LOW (ref >60)
Glucose, Bld: 185 mg/dL — ABNORMAL HIGH (ref 70–99)
Potassium: 4.1 mmol/L (ref 3.5–5.1)
Sodium: 143 mmol/L (ref 135–145)

## 2020-11-20 LAB — LACTATE DEHYDROGENASE: LDH: 255 U/L — ABNORMAL HIGH (ref 98–192)

## 2020-11-20 LAB — BLOOD GAS, VENOUS
Acid-Base Excess: 1.9 mmol/L (ref 0.0–2.0)
Bicarbonate: 26.1 mmol/L (ref 20.0–28.0)
O2 Saturation: 78.8 %
Patient temperature: 98.6
pCO2, Ven: 41.5 mmHg — ABNORMAL LOW (ref 44.0–60.0)
pH, Ven: 7.415 (ref 7.250–7.430)
pO2, Ven: 49.7 mmHg — ABNORMAL HIGH (ref 32.0–45.0)

## 2020-11-20 LAB — PROTIME-INR
INR: 1 (ref 0.8–1.2)
Prothrombin Time: 12.9 seconds (ref 11.4–15.2)

## 2020-11-20 LAB — HEPATIC FUNCTION PANEL
ALT: 13 U/L (ref 0–44)
AST: 27 U/L (ref 15–41)
Albumin: 3.6 g/dL (ref 3.5–5.0)
Alkaline Phosphatase: 68 U/L (ref 38–126)
Bilirubin, Direct: 0.1 mg/dL (ref 0.0–0.2)
Indirect Bilirubin: 0.5 mg/dL (ref 0.3–0.9)
Total Bilirubin: 0.6 mg/dL (ref 0.3–1.2)
Total Protein: 7.5 g/dL (ref 6.5–8.1)

## 2020-11-20 LAB — RAPID URINE DRUG SCREEN, HOSP PERFORMED
Amphetamines: NOT DETECTED
Barbiturates: NOT DETECTED
Benzodiazepines: NOT DETECTED
Cocaine: NOT DETECTED
Opiates: NOT DETECTED
Tetrahydrocannabinol: NOT DETECTED

## 2020-11-20 LAB — URINALYSIS, ROUTINE W REFLEX MICROSCOPIC
Bilirubin Urine: NEGATIVE
Glucose, UA: NEGATIVE mg/dL
Hgb urine dipstick: NEGATIVE
Ketones, ur: NEGATIVE mg/dL
Leukocytes,Ua: NEGATIVE
Nitrite: NEGATIVE
Protein, ur: NEGATIVE mg/dL
Specific Gravity, Urine: 1.023 (ref 1.005–1.030)
pH: 5 (ref 5.0–8.0)

## 2020-11-20 LAB — BRAIN NATRIURETIC PEPTIDE: B Natriuretic Peptide: 49.3 pg/mL (ref 0.0–100.0)

## 2020-11-20 LAB — I-STAT BETA HCG BLOOD, ED (MC, WL, AP ONLY): I-stat hCG, quantitative: <5 m[IU]/mL (ref <5)

## 2020-11-20 LAB — FERRITIN: Ferritin: 8 ng/mL — ABNORMAL LOW (ref 11–307)

## 2020-11-20 LAB — D-DIMER, QUANTITATIVE: D-Dimer, Quant: 0.56 ug/mL-FEU — ABNORMAL HIGH (ref 0.00–0.50)

## 2020-11-20 LAB — CBG MONITORING, ED: Glucose-Capillary: 164 mg/dL — ABNORMAL HIGH (ref 70–99)

## 2020-11-20 LAB — FIBRINOGEN: Fibrinogen: 285 mg/dL (ref 210–475)

## 2020-11-20 LAB — TROPONIN I (HIGH SENSITIVITY): Troponin I (High Sensitivity): 5 ng/L (ref <18)

## 2020-11-20 LAB — TRIGLYCERIDES: Triglycerides: 280 mg/dL — ABNORMAL HIGH (ref <150)

## 2020-11-20 LAB — BETA-HYDROXYBUTYRIC ACID: Beta-Hydroxybutyric Acid: 0.15 mmol/L (ref 0.05–0.27)

## 2020-11-20 LAB — C-REACTIVE PROTEIN: CRP: 0.5 mg/dL (ref <1.0)

## 2020-11-20 LAB — PROCALCITONIN: Procalcitonin: <0.1 ng/mL

## 2020-11-20 LAB — AMMONIA: Ammonia: 43 umol/L — ABNORMAL HIGH (ref 9–35)

## 2020-11-20 LAB — APTT: aPTT: <20 seconds — ABNORMAL LOW (ref 24–36)

## 2020-11-20 LAB — LACTIC ACID, PLASMA
Lactic Acid, Venous: 2.8 mmol/L (ref 0.5–1.9)
Lactic Acid, Venous: 2.1 mmol/L (ref 0.5–1.9)

## 2020-11-20 MED ORDER — ONDANSETRON HCL 4 MG PO TABS: 4.0000 mg | ORAL_TABLET | Freq: Four times a day (QID) | ORAL | Status: DC | PRN

## 2020-11-20 MED ORDER — SODIUM CHLORIDE 0.9 % IV SOLN
100.0000 mg | Freq: Every day | INTRAVENOUS | Status: AC
  Administered 2020-11-21 – 2020-11-22 (×2): 100 mg via INTRAVENOUS
  Filled 2020-11-20 (×2): qty 20

## 2020-11-20 MED ORDER — ZINC SULFATE 220 (50 ZN) MG PO CAPS
220.0000 mg | ORAL_CAPSULE | Freq: Every day | ORAL | Status: DC
  Administered 2020-11-20 – 2020-11-23 (×4): 220 mg via ORAL
  Filled 2020-11-20 (×4): qty 1

## 2020-11-20 MED ORDER — VANCOMYCIN HCL IN DEXTROSE 1-5 GM/200ML-% IV SOLN
1000.0000 mg | Freq: Once | INTRAVENOUS | Status: AC
  Administered 2020-11-20: 1000 mg via INTRAVENOUS
  Filled 2020-11-20: qty 200

## 2020-11-20 MED ORDER — INSULIN LISPRO PROT & LISPRO (75-25 MIX) 100 UNIT/ML KWIKPEN: 50.0000 [IU] | PEN_INJECTOR | Freq: Two times a day (BID) | SUBCUTANEOUS | Status: DC

## 2020-11-20 MED ORDER — IOHEXOL 350 MG/ML SOLN
100.0000 mL | Freq: Once | INTRAVENOUS | Status: AC | PRN
  Administered 2020-11-20: 100 mL via INTRAVENOUS

## 2020-11-20 MED ORDER — INSULIN ASPART PROT & ASPART (70-30 MIX) 100 UNIT/ML ~~LOC~~ SUSP
50.0000 [IU] | Freq: Two times a day (BID) | SUBCUTANEOUS | Status: DC
  Administered 2020-11-20 – 2020-11-23 (×6): 50 [IU] via SUBCUTANEOUS
  Filled 2020-11-20: qty 10

## 2020-11-20 MED ORDER — INSULIN ASPART 100 UNIT/ML ~~LOC~~ SOLN
0.0000 [IU] | Freq: Three times a day (TID) | SUBCUTANEOUS | Status: DC
  Administered 2020-11-21: 3 [IU] via SUBCUTANEOUS
  Administered 2020-11-22 (×2): 8 [IU] via SUBCUTANEOUS
  Administered 2020-11-22: 11 [IU] via SUBCUTANEOUS
  Administered 2020-11-23: 5 [IU] via SUBCUTANEOUS
  Filled 2020-11-20: qty 0.15

## 2020-11-20 MED ORDER — BUPROPION HCL ER (SR) 150 MG PO TB12
150.0000 mg | ORAL_TABLET | Freq: Two times a day (BID) | ORAL | Status: DC
  Administered 2020-11-20 – 2020-11-23 (×6): 150 mg via ORAL
  Filled 2020-11-20 (×6): qty 1

## 2020-11-20 MED ORDER — METOPROLOL SUCCINATE ER 100 MG PO TB24
200.0000 mg | ORAL_TABLET | Freq: Every day | ORAL | Status: DC
Start: 2020-11-21 — End: 2020-11-21
  Administered 2020-11-21: 200 mg via ORAL
  Filled 2020-11-20: qty 2

## 2020-11-20 MED ORDER — PROMETHAZINE HCL 25 MG PO TABS: 12.5000 mg | ORAL_TABLET | Freq: Four times a day (QID) | ORAL | Status: DC | PRN

## 2020-11-20 MED ORDER — SODIUM CHLORIDE 0.9 % IV SOLN
2.0000 g | Freq: Once | INTRAVENOUS | Status: AC
  Administered 2020-11-20: 2 g via INTRAVENOUS
  Filled 2020-11-20: qty 2

## 2020-11-20 MED ORDER — METOPROLOL TARTRATE 5 MG/5ML IV SOLN: 5.0000 mg | Freq: Four times a day (QID) | INTRAVENOUS | Status: DC | PRN

## 2020-11-20 MED ORDER — LORATADINE 10 MG PO TABS
10.0000 mg | ORAL_TABLET | Freq: Every day | ORAL | Status: DC
  Administered 2020-11-20 – 2020-11-23 (×4): 10 mg via ORAL
  Filled 2020-11-20 (×4): qty 1

## 2020-11-20 MED ORDER — LACTATED RINGERS IV SOLN: INTRAVENOUS | Status: DC

## 2020-11-20 MED ORDER — METRONIDAZOLE IN NACL 5-0.79 MG/ML-% IV SOLN
500.0000 mg | Freq: Once | INTRAVENOUS | Status: AC
  Administered 2020-11-20: 500 mg via INTRAVENOUS
  Filled 2020-11-20: qty 100

## 2020-11-20 MED ORDER — ROSUVASTATIN CALCIUM 5 MG PO TABS
5.0000 mg | ORAL_TABLET | Freq: Every day | ORAL | Status: DC
  Administered 2020-11-21 – 2020-11-23 (×3): 5 mg via ORAL
  Filled 2020-11-20 (×4): qty 1

## 2020-11-20 MED ORDER — FUROSEMIDE 40 MG PO TABS
40.0000 mg | ORAL_TABLET | Freq: Two times a day (BID) | ORAL | Status: DC
  Administered 2020-11-21 – 2020-11-23 (×5): 40 mg via ORAL
  Filled 2020-11-20 (×6): qty 1

## 2020-11-20 MED ORDER — ENOXAPARIN SODIUM 60 MG/0.6ML ~~LOC~~ SOLN
60.0000 mg | SUBCUTANEOUS | Status: DC
  Administered 2020-11-21 – 2020-11-22 (×2): 60 mg via SUBCUTANEOUS
  Filled 2020-11-20 (×3): qty 0.6

## 2020-11-20 MED ORDER — ONDANSETRON HCL 4 MG/2ML IJ SOLN: 4.0000 mg | Freq: Four times a day (QID) | INTRAMUSCULAR | Status: DC | PRN

## 2020-11-20 MED ORDER — FERROUS SULFATE 325 (65 FE) MG PO TABS
325.0000 mg | ORAL_TABLET | Freq: Every day | ORAL | Status: DC
  Administered 2020-11-21 – 2020-11-23 (×3): 325 mg via ORAL
  Filled 2020-11-20 (×3): qty 1

## 2020-11-20 MED ORDER — METOPROLOL SUCCINATE ER 50 MG PO TB24: 200.0000 mg | ORAL_TABLET | Freq: Every day | ORAL | Status: DC

## 2020-11-20 MED ORDER — SODIUM CHLORIDE 0.9 % IV SOLN
200.0000 mg | Freq: Once | INTRAVENOUS | Status: AC
  Administered 2020-11-20: 200 mg via INTRAVENOUS
  Filled 2020-11-20: qty 200

## 2020-11-20 MED ORDER — HYDRALAZINE HCL 20 MG/ML IJ SOLN
10.0000 mg | Freq: Once | INTRAMUSCULAR | Status: AC
  Administered 2020-11-20: 10 mg via INTRAVENOUS
  Filled 2020-11-20: qty 1

## 2020-11-20 MED ORDER — SUCRALFATE 1 GM/10ML PO SUSP
1.0000 g | Freq: Three times a day (TID) | ORAL | Status: DC
  Administered 2020-11-20 – 2020-11-23 (×10): 1 g via ORAL
  Filled 2020-11-20 (×10): qty 10

## 2020-11-20 MED ORDER — METOPROLOL SUCCINATE ER 50 MG PO TB24
100.0000 mg | ORAL_TABLET | Freq: Once | ORAL | Status: AC
  Administered 2020-11-20: 100 mg via ORAL
  Filled 2020-11-20: qty 2

## 2020-11-20 MED ORDER — METOPROLOL TARTRATE 5 MG/5ML IV SOLN
5.0000 mg | Freq: Once | INTRAVENOUS | Status: AC
  Administered 2020-11-20: 5 mg via INTRAVENOUS
  Filled 2020-11-20: qty 5

## 2020-11-20 MED ORDER — GUAIFENESIN-DM 100-10 MG/5ML PO SYRP
10.0000 mL | ORAL_SOLUTION | ORAL | Status: DC | PRN
  Administered 2020-11-21 – 2020-11-22 (×3): 10 mL via ORAL
  Filled 2020-11-20 (×3): qty 10

## 2020-11-20 MED ORDER — SPIRONOLACTONE 25 MG PO TABS
50.0000 mg | ORAL_TABLET | Freq: Every day | ORAL | Status: DC
Start: 2020-11-21 — End: 2020-11-23
  Administered 2020-11-21 – 2020-11-23 (×3): 50 mg via ORAL
  Filled 2020-11-20 (×4): qty 2

## 2020-11-20 MED ORDER — SODIUM CHLORIDE 0.9 % IV BOLUS
1000.0000 mL | Freq: Once | INTRAVENOUS | Status: AC
  Administered 2020-11-20: 1000 mL via INTRAVENOUS

## 2020-11-20 MED ORDER — ASCORBIC ACID 500 MG PO TABS
500.0000 mg | ORAL_TABLET | Freq: Every day | ORAL | Status: DC
  Administered 2020-11-20 – 2020-11-23 (×4): 500 mg via ORAL
  Filled 2020-11-20 (×4): qty 1

## 2020-11-20 MED ORDER — FLUTICASONE PROPIONATE 50 MCG/ACT NA SUSP
2.0000 | Freq: Every day | NASAL | Status: DC
  Administered 2020-11-21 – 2020-11-23 (×3): 2 via NASAL
  Filled 2020-11-20: qty 16

## 2020-11-20 MED ORDER — LOSARTAN POTASSIUM 50 MG PO TABS
50.0000 mg | ORAL_TABLET | Freq: Every day | ORAL | Status: DC
Start: 2020-11-20 — End: 2020-11-23
  Administered 2020-11-20 – 2020-11-23 (×4): 50 mg via ORAL
  Filled 2020-11-20 (×2): qty 1
  Filled 2020-11-20: qty 2
  Filled 2020-11-20: qty 1

## 2020-11-20 NOTE — ED Triage Notes (Signed)
Patient BIBA from home d/t near syncopal episode last night. Patient was found by sons on floor. Patient states she started to feel weak and sat down on floor. Denies hitting head or LOC.  Reports multiple stressors at home including taking care of sick mother who was admitted to hospital yesterday.  P 120 SpO2 92% RA CBG 183 BP 166/92 RR 28  20 G RAC 350 cc NS

## 2020-11-20 NOTE — Progress Notes (Signed)
Flutters are on back order at this time. 

## 2020-11-20 NOTE — Progress Notes (Signed)
A consult was received from an ED physician for vancomycin and cefepime per pharmacy dosing.  The patient's profile has been reviewed for ht/wt/allergies/indication/available labs.   No weight available in chart for weight based vancomycin dosing. Ht/Wt ordered  A one time order has been placed for vancomycin 1000 mg IV + cefepime 2 g IV once.    Further antibiotics/pharmacy consults should be ordered by admitting physician if indicated.                       Thank you, Cindi Carbon, PharmD 11/20/2020  1:03 PM

## 2020-11-20 NOTE — Sepsis Progress Note (Signed)
2nd lactic acid was delayed due to pt in radiology for CT.

## 2020-11-20 NOTE — Sepsis Progress Note (Signed)
[facility]ink is monitoring this code sepsis. Thank you. 

## 2020-11-20 NOTE — H&P (Addendum)
History and Physical    Jill Shaw AEW:[id number] DOB: [birth year] DOA: 11/20/2020  PCP: Barbette Merino, NP  Patient coming from: Home  Chief Complaint: AMS  HPI: Jill Shaw is a 45 y.o. female with medical history significant of HTN, DM2. Patient is a poor historian. Hx from son. He reports that yesterday his mom was in a depressed mood d/t illness of another family member. She has a poor appetite and decided to go to be early. This morning a family member found her on the floor. It appeared as if she fell out of the bed. She was very slow to get up with assistance and she seem to be sluggish in all her responses. The son report that this is how she acts when her sugar is low, but they were unable to check her glucose level. They became concerned and called for EMS.   ED Course: She was found to be septic. Started on broad spec abx. CTH was negative. CTA PE was negative for clot but limited by motion. VBG did not show CO2 retention and ammonia was only mildly elevated. [facility] was called for admission.   Review of Systems:  Denies CP, palpitations, dyspnea, HA, N/V. Reports cough, fatigue. Review of systems is otherwise negative for all not mentioned in HPI.   PMHx Past Medical History:  Diagnosis Date  . Anemia   . Arthritis    knees, hands  . Asthma   . Chronic diastolic (congestive) heart failure (HCC)   . COPD (chronic obstructive pulmonary disease) (HCC)   . Diabetes mellitus without complication (HCC)    type 2  . Dysfunctional uterine bleeding   . GERD (gastroesophageal reflux disease)   . Hypertension   . Neuromuscular disorder (HCC)    neuropathy feet  . Seizures (HCC) 09/12/2017   pt states r/t stress and blood sugar - no meds last one 4 months ago, not seen neurologist  . Sickle cell trait (HCC)   . Smoker   . Vitamin D deficiency 10/2019  . Wears glasses     PSHx Past Surgical History:  Procedure Laterality Date  . CESAREAN SECTION     x 1. for twins  .  DILATION AND CURETTAGE OF UTERUS N/A 08/20/2019   Procedure: DILATATION AND CURETTAGE;  Surgeon: Allie Bossier, MD;  Location: [facility] OR;  Service: Gynecology;  Laterality: N/A;  . ENDOMETRIAL ABLATION N/A 08/20/2019   Procedure: [place]a Ablation;  Surgeon: Allie Bossier, MD;  Location: [facility] OR;  Service: Gynecology;  Laterality: N/A;  . EYE SURGERY Bilateral    laser right and cataract removed left eye  . RADIOLOGY WITH ANESTHESIA N/A 09/16/2019   Procedure: MRI WITH ANESTHESIA   L SPINE WITHOUT CONTRAST, T SPINE WITHOUT CONTRAST , CERVICAL WITHOUT CONTRAST;  Surgeon: Radiologist, Medication, MD;  Location: [facility] OR;  Service: Radiology;  Laterality: N/A;  . TUBAL LIGATION     interval BTL  . UPPER GI ENDOSCOPY  07/2017    SocHx  reports that she quit smoking about 2 months ago. Her smoking use included cigarettes. She has a 6.50 pack-year smoking history. She has never used smokeless tobacco. She reports that she does not drink alcohol and does not use drugs.  Allergies  Allergen Reactions  . Ketoprofen Nausea And Vomiting  . Aspirin Nausea Only  . Gabapentin Nausea And Vomiting and Other (See Comments)    upset stomach  . Ibuprofen Nausea And Vomiting  . Liraglutide Nausea And Vomiting  . Naproxen  Nausea And Vomiting  . Omeprazole-Sodium Bicarbonate Nausea And Vomiting  . Sulfa Antibiotics Nausea And Vomiting  . Tramadol Nausea And Vomiting and Other (See Comments)    stomach upset    FamHx Family History  Problem Relation Age of Onset  . Diabetes Mother   . Hypertension Mother     Prior to Admission medications   Medication Sig Start Date End Date Taking? Authorizing Provider  albuterol (PROVENTIL) (2.5 MG/3ML) 0.083% nebulizer solution Take 3 mLs (2.5 mg total) by nebulization every 6 (six) hours as needed for wheezing or shortness of breath. 09/27/20  Yes Icard, Bradley L, DO  albuterol (VENTOLIN HFA) 108 (90 Base) MCG/ACT inhaler Inhale 2 puffs into the lungs every 6 (six) hours as  needed for wheezing or shortness of breath. 09/27/20  Yes Icard, Octavio Graves, DO  amitriptyline (ELAVIL) 75 MG tablet Take 1 tablet (75 mg total) by mouth at bedtime. 10/13/20  Yes Raulkar, Clide Deutscher, MD  budesonide-formoterol (SYMBICORT) 160-4.5 MCG/ACT inhaler INHALE 2 PUFFS INTO THE LUNGS 2 (TWO) TIMES DAILY. Patient taking differently: Inhale 2 puffs into the lungs 2 (two) times daily. 09/27/20  Yes Icard, Octavio Graves, DO  buPROPion (WELLBUTRIN SR) 150 MG 12 hr tablet Take 1 tablet (150 mg total) by mouth 2 (two) times daily. 07/19/20 07/19/21 Yes Vevelyn Francois, NP  cetirizine (ZYRTEC) 10 MG tablet Take 1 tablet (10 mg total) by mouth daily. 10/07/20  Yes Vevelyn Francois, NP  diclofenac Sodium (VOLTAREN) 1 % GEL Apply 4 g topically 4 (four) times daily as needed (pain). 08/15/20  Yes [provider]  ferrous sulfate 325 (65 FE) MG tablet Take 1 tablet (325 mg total) by mouth 3 (three) times daily with meals. Patient taking differently: Take 325 mg by mouth daily with breakfast. 12/02/19  Yes Vevelyn Francois, NP  fluticasone (FLONASE) 50 MCG/ACT nasal spray Place 2 sprays into both nostrils daily. 11/24/19  Yes Vevelyn Francois, NP  furosemide (LASIX) 40 MG tablet Take 1 tablet (40 mg total) by mouth 2 (two) times daily. 08/11/20  Yes Lorretta Harp, MD  Glucosamine Sulfate 1000 MG CAPS Take 1 capsule (1,000 mg total) by mouth 2 (two) times daily. Patient taking differently: Take 1,000 mg by mouth 2 (two) times daily. 08/22/19  Yes Hilts, Legrand Como, MD  hydrocortisone 2.5 % cream Apply topically 2 (two) times daily. Patient taking differently: Apply 1 application topically 2 (two) times daily. 09/22/20  Yes Vevelyn Francois, NP  HYDROmorphone (DILAUDID) 2 MG tablet Take 1 tablet (2 mg total) by mouth every 8 (eight) hours as needed for severe pain. 10/27/20  Yes Aletha Halim, MD  hydroquinone 4 % cream APPLY TOPICALLY TWICE DAILY Patient taking differently: Apply 1 application topically 2 (two)  times daily. 09/13/20  Yes King, Diona Foley, NP  Insulin Lispro Prot & Lispro (HUMALOG MIX 75/25 KWIKPEN) (75-25) 100 UNIT/ML Kwikpen Inject 120 Units into the skin 2 (two) times daily. INJECT 110 UNITS EVERY 12 HOURS Patient taking differently: Inject 110 Units into the skin 2 (two) times daily. 09/13/20 09/13/21 Yes King, Diona Foley, NP  lidocaine (XYLOCAINE) 5 % ointment Apply 1 application topically as needed. Patient taking differently: Apply 1 application topically as needed for mild pain. 09/16/20  Yes Patel, Donika K, DO  losartan (COZAAR) 50 MG tablet TAKE 1 TABLET($RemoveBefor'50MG'ItoZMlFdSgTb$  TOTAL) BY MOUTH EVERY DAY Patient taking differently: Take 50 mg by mouth daily. 11/08/20  Yes Vevelyn Francois, NP  metoprolol succinate (TOPROL-XL) 200 MG  24 hr tablet Take 1 tablet (200 mg total) by mouth daily. Take with or immediately following a meal. Patient taking differently: Take 200 mg by mouth daily. 10/14/20  Yes Patwardhan, Manish J, MD  nicotine (NICODERM CQ) 21 mg/24hr patch Place 1 patch (21 mg total) onto the skin daily. 07/12/20  Yes Lauraine Rinne, NP  norethindrone (AYGESTIN) 5 MG tablet Take 2 tablets (10 mg total) by mouth in the morning, at noon, in the evening, and at bedtime. With bleeding 10/19/20  Yes Aletha Halim, MD  omeprazole (PRILOSEC) 40 MG capsule TAKE 1 CAPSULE($RemoveBefore'40MG'YfnLTVsAybgCo$  TOTAL) BY MOUTH EVERY DAY Patient taking differently: Take 40 mg by mouth daily. 09/23/20  Yes King, Diona Foley, NP  potassium chloride SA (KLOR-CON) 20 MEQ tablet Take 20 mEq by mouth daily. 11/16/20  Yes [provider]  promethazine (PHENERGAN) 12.5 MG tablet Take 1 tablet (12.5 mg total) by mouth every 6 (six) hours as needed for nausea or vomiting. 03/10/20  Yes Patrecia Pour, MD  rOPINIRole (REQUIP) 0.25 MG tablet Take 1 tablet (0.25 mg total) by mouth 3 (three) times daily. 11/19/20 11/19/21 Yes Raulkar, Clide Deutscher, MD  rosuvastatin (CRESTOR) 5 MG tablet Take 1 tablet (5 mg total) by mouth daily. 08/11/20  Yes Lorretta Harp, MD  saxagliptin HCl (ONGLYZA) 2.5 MG TABS tablet Take 1 tablet (2.5 mg total) by mouth daily. 12/02/19  Yes Vevelyn Francois, NP  spironolactone (ALDACTONE) 50 MG tablet Take 1 tablet (50 mg total) by mouth daily. 09/01/20 11/30/20 Yes Patwardhan, Manish J, MD  sucralfate (CARAFATE) 1 GM/10ML suspension Take 10 mLs (1 g total) by mouth 4 (four) times daily -  with meals and at bedtime. 07/04/20  Yes Bonnielee Haff, MD  Tiotropium Bromide Monohydrate (SPIRIVA RESPIMAT) 2.5 MCG/ACT AERS Inhale 2 puffs into the lungs daily. 09/27/20  Yes Icard, Bradley L, DO  tiZANidine (ZANAFLEX) 4 MG tablet TAKE 1 TABLET(4 MG) BY MOUTH THREE TIMES DAILY Patient taking differently: Take 4 mg by mouth 3 (three) times daily. 11/19/20  Yes Vevelyn Francois, NP  Turmeric 500 MG CAPS Take 500 mg by mouth 2 (two) times daily. 08/22/19  Yes Hilts, Legrand Como, MD  Vitamin D, Ergocalciferol, (DRISDOL) 1.25 MG (50000 UNIT) CAPS capsule Take 1 capsule (50,000 Units total) by mouth every 7 (seven) days. Patient taking differently: Take 50,000 Units by mouth every Friday. 12/02/19  Yes Vevelyn Francois, NP  ACCU-CHEK GUIDE test strip USE AS DIRECTED UP TO FOUR TIMES DAILY 11/11/20   Vevelyn Francois, NP  blood glucose meter kit and supplies KIT 1 each by Other route See admin instructions. Dispense based on patient and insurance preference. Use up to four times daily as directed. (FOR ICD-9 250.00, 250.01). 09/02/20   Vevelyn Francois, NP  Blood Glucose Monitoring Suppl (TRUE METRIX METER) w/Device KIT 1 each by Does not apply route 4 (four) times daily -  before meals and at bedtime. 01/21/18   Dorena Dew, FNP  Continuous Blood Gluc Receiver (FREESTYLE LIBRE 14 DAY READER) DEVI 1 application by Does not apply route every 14 (fourteen) days. 08/13/20 08/13/21  Vevelyn Francois, NP  fluconazole (DIFLUCAN) 150 MG tablet Take 1 tablet (150 mg total) by mouth daily. Take one tab now and one in 3 days if still having symptoms Patient not taking:  Reported on 11/20/2020 11/12/20   Loura Halt A, NP  Insulin Pen Needle (PEN NEEDLES) 30G X 5 MM MISC 1 Units by Does not apply  route as directed. 12/02/19   Vevelyn Francois, NP  metoCLOPramide (REGLAN) 10 MG tablet Take 1 tablet (10 mg total) by mouth 4 (four) times daily -  before meals and at bedtime. 10/17/20 11/16/20  Donne Hazel, MD  TRUEplus Lancets 28G MISC 1 each by Other route in the morning, at noon, in the evening, and at bedtime. 02/12/20   [provider]  hydrALAZINE (APRESOLINE) 50 MG tablet Take 1 tablet (50 mg total) by mouth 3 (three) times daily. 02/03/20 02/20/20  Thurnell Lose, MD    Physical Exam: Vitals:   11/20/20 1600 11/20/20 1615 11/20/20 1630 11/20/20 1645  BP: (!) 220/114 (!) 170/88 (!) 191/92 (!) 189/64  Pulse: (!) 116 (!) 112 (!) 114 (!) 110  Resp: (!) 28 (!) 26 (!) 24 (!) 26  Temp:      TempSrc:      SpO2: 100% 100% 100% 100%    General: 45 y.o. female resting in bed in NAD Eyes: PERRL, normal sclera ENMT: Nares patent w/o discharge, orophaynx clear, dentition normal, ears w/o discharge/lesions/ulcers Neck: Supple, trachea midline Cardiovascular: tachy, +S1, S2, no m/g/r, equal pulses throughout Respiratory: decreased at bases, no w/r/r, normal WOB on RA GI: BS+, NDNT, obese, no masses noted, no organomegaly noted MSK: No e/c/c Skin: No rashes, bruises, ulcerations noted Neuro: A&O x 3, no focal deficits Psyc: Appropriate interaction and flat affect, calm/cooperative  Labs on Admission: I have personally reviewed following labs and imaging studies  CBC: Recent Labs  Lab 11/20/20 1034  WBC 10.8*  NEUTROABS 8.3*  HGB 9.9*  HCT 33.0*  MCV 72.7*  PLT 401   Basic Metabolic Panel: Recent Labs  Lab 11/20/20 1034  NA 143  K 4.1  CL 107  CO2 24  GLUCOSE 185*  BUN 15  CREATININE 1.25*  CALCIUM 9.1   GFR: CrCl cannot be calculated (Unknown ideal weight.). Liver Function Tests: Recent Labs  Lab 11/20/20 1034  AST 27  ALT 13   ALKPHOS 68  BILITOT 0.6  PROT 7.5  ALBUMIN 3.6   No results for input(s): LIPASE, AMYLASE in the last 168 hours. Recent Labs  Lab 11/20/20 1036  AMMONIA 43*   Coagulation Profile: Recent Labs  Lab 11/20/20 1112  INR 1.0   Cardiac Enzymes: No results for input(s): CKTOTAL, CKMB, CKMBINDEX, TROPONINI in the last 168 hours. BNP (last 3 results) Recent Labs    07/12/20 1649  PROBNP 57.0   HbA1C: No results for input(s): HGBA1C in the last 72 hours. CBG: No results for input(s): GLUCAP in the last 168 hours. Lipid Profile: Recent Labs    11/20/20 1034  TRIG 280*   Thyroid Function Tests: No results for input(s): TSH, T4TOTAL, FREET4, T3FREE, THYROIDAB in the last 72 hours. Anemia Panel: Recent Labs    11/20/20 1312  FERRITIN 8*   Urine analysis:    Component Value Date/Time   COLORURINE YELLOW 11/20/2020 [place] 11/20/2020 1644   LABSPEC 1.023 11/20/2020 1644   PHURINE 5.0 11/20/2020 1644   GLUCOSEU NEGATIVE 11/20/2020 [place] 11/20/2020 [place] 11/20/2020 1644   BILIRUBINUR negative 06/02/2020 1419   BILIRUBINUR NEGATIVE 11/05/2019 1342   KETONESUR NEGATIVE 11/20/2020 1644   PROTEINUR NEGATIVE 11/20/2020 1644   UROBILINOGEN 0.2 11/12/2020 1148   NITRITE NEGATIVE 11/20/2020 [place] 11/20/2020 1644    Radiological Exams on Admission: CT Head Wo Contrast  Result Date: 11/20/2020 CLINICAL DATA:  Syncopal  episode EXAM: CT HEAD WITHOUT CONTRAST TECHNIQUE: Contiguous axial images were obtained from the base of the skull through the vertex without intravenous contrast. COMPARISON:  July 02, 2020 FINDINGS: Brain: No evidence of acute infarction, hemorrhage, hydrocephalus, extra-axial collection or mass lesion/mass effect. Vascular: No hyperdense vessel or unexpected calcification. Skull: Normal. Negative for fracture or focal lesion. Sinuses/Orbits: No acute finding. Other: None.  IMPRESSION: No acute intracranial abnormality. Electronically Signed   By: Valentino Saxon MD   On: 11/20/2020 14:16   CT Angio Chest PE W and/or Wo Contrast  Result Date: 11/20/2020 CLINICAL DATA:  Syncopal episode EXAM: CT ANGIOGRAPHY CHEST WITH CONTRAST TECHNIQUE: Multidetector CT imaging of the chest was performed using the standard protocol during bolus administration of intravenous contrast. Multiplanar CT image reconstructions and MIPs were obtained to evaluate the vascular anatomy. CONTRAST:  167mL OMNIPAQUE IOHEXOL 350 MG/ML SOLN COMPARISON:  None. FINDINGS: Cardiovascular: Nondiagnostic exam for pulmonary embolism. Patient is unable to follow instructions and artifact related to arm positioning as well as respiratory motion markedly degrade quality. No central pulmonary embolism. No pericardial effusion. Heart is enlarged. Aorta is normal in caliber. Mediastinum/Nodes: Visualized thyroid is unremarkable. No axillary or mediastinal adenopathy. Lungs/Pleura: No pleural effusion or pneumothorax. Bibasilar atelectasis. Scattered areas of peripheral ground-glass opacities (series 8, image 72). Upper Abdomen: No acute abnormality. Musculoskeletal: No acute osseous abnormality. Review of the MIP images confirms the above findings. IMPRESSION: 1. Nondiagnostic exam for pulmonary embolism due to patient inability to follow instructions/respiratory motion and artifact related to arm positioning. No central pulmonary embolism. If persistent concern for pulmonary embolism, recommend dedicated V/Q scan versus repeat scan when patient is able to follow instructions. 2. Scattered areas of peripheral ground-glass opacities. These are likely infectious or inflammatory in etiology. COVID-19 infection could present similarly. Electronically Signed   By: Valentino Saxon MD   On: 11/20/2020 14:25   DG Chest Port 1 View  Result Date: 11/20/2020 CLINICAL DATA:  Near syncopal episode.  Question sepsis. EXAM: PORTABLE  CHEST 1 VIEW COMPARISON:  One-view chest x-ray 11/12/2020 FINDINGS: Heart is enlarged. Mild pulmonary vascular congestion is present. Lung volumes are low. No significant airspace consolidation is present. IMPRESSION: Cardiomegaly and mild pulmonary vascular congestion. Electronically Signed   By: San Morelle M.D.   On: 11/20/2020 11:23   Assessment/Plan Acute metabolic encephalopathy     - admit to inpt, progressive     - etiology unknown; procal is neg, she does have a COVID infection, VBG doesn't show CO2 retention, ammonia is not that far out of range, she is not in HHS; CTH is unremarkable     - started on broad spec abx; procal is negative, hold further abx     - her mentation is clearing, now A&O x 3; maybe hypertensive encephalopathy?     - check UDS as well  COVID 19 infection Sepsis secondary to ?COVID?     - sepsis criteria: tachycardia, tachypnea, presumed source: COVID, elevated lactic acid; mentation change     - start remdes, vitamins     - follow inflammatory markers     - she's on RA and satting 100%     - started remdes mainly due to sepsis w/o any other source at this time  HTN urgency HTN     - her mentation is improving     - resume her home medication regimen     - will modify her regimen for tonight to help bring her down slowly, looking for MAP  reduction of about 25%  CKD3a     - at baseline Scr, follow  Morbid obesity     - diet/lifestyle changes; follow up outpatient  DM2     - SSI, DM diet, glucose checks     - A1c one month ago was 9.5%  DVT prophylaxis: lovenox  Code Status: FULL  Family Communication: Spoke to son by phone.   Consults called: None   Status is: Inpatient  Remains inpatient appropriate because:Inpatient level of care appropriate due to severity of illness   Dispo: The patient is from: Home              Anticipated d/c is to: Home              Anticipated d/c date is: 2 days              Patient currently is not  medically stable to d/c.   Difficult to place patient No  Time spent coordinating admission: 70 minutes  [facility]  If 7PM-7AM, please contact night-coverage [email or web address]  11/20/2020, 5:13 PM

## 2020-11-20 NOTE — ED Provider Notes (Signed)
[facility] DEPT Provider Note   CSN: [id number] Arrival date & time: 11/20/20  7893     History Chief Complaint  Patient presents with  . Near Syncope    Jill Shaw is a 45 y.o. female with history of obesity, COPD, diabetes, hypertension, presented to emergency department with near syncope and altered mental status. The patient cannot provide any history as she is altered on arrival. Supplemental history provided by her son by phone. He reports that the patient's been dealing with extreme stress due to the hospitalization of her mother over the past week or 2. Yesterday evening they were visiting the patient and she was crying in the evening. They report that she got in bed last night. This morning when at the visit the patient was found sprawled out on the floor. She was not responding to them. They called EMS who brought her into the ED. The patient cannot provide any further history. She is groggy and simply mumbles, "I'm sleepy, let me sleep."  She tested positive for covid last week.  She states she received the vaccines.     HPI     Past Medical History:  Diagnosis Date  . Anemia   . Arthritis    knees, hands  . Asthma   . Chronic diastolic (congestive) heart failure ([place])   . COPD (chronic obstructive pulmonary disease) ([place])   . Diabetes mellitus without complication ([place])    type 2  . Dysfunctional uterine bleeding   . GERD (gastroesophageal reflux disease)   . Hypertension   . Neuromuscular disorder (HCC)    neuropathy feet  . Seizures ([place]) 09/12/2017   pt states r/t stress and blood sugar - no meds last one 4 months ago, not seen neurologist  . Sickle cell trait ([place])   . Smoker   . Vitamin D deficiency 10/2019  . Wears glasses     Patient Active Problem List   Diagnosis Date Noted  . DKA (diabetic ketoacidosis) ([place]) 10/15/2020  . Hyperkalemia 10/15/2020  . Severe sepsis ([place]) 10/15/2020  . Acute metabolic  encephalopathy 81/10/7508  . Sinus tachycardia 10/14/2020  . Precordial pain 10/14/2020  . Abnormal findings on diagnostic imaging of lung 08/19/2020  . Physical deconditioning 08/19/2020  . History of endometrial ablation 07/27/2020  . History of alcohol use 07/20/2020  . Current moderate episode of major depressive disorder without prior episode ([place]) 07/20/2020  . Sickle cell trait ([place]) 07/19/2020  . Elevated d-dimer 07/13/2020  . Healthcare maintenance 07/12/2020  . Exertional dyspnea 07/12/2020  . Atypical chest pain 07/12/2020  . At risk for obstructive sleep apnea 07/12/2020  . DKA (diabetic ketoacidoses) 07/02/2020  . Acute on chronic diastolic (congestive) heart failure ([name]) 06/22/2020  . Abdominal pain 03/08/2020  . Chronic diastolic CHF (congestive heart failure) ([place]) 03/08/2020  . AKI (acute kidney injury) ([place]) 01/29/2020  . Symptomatic anemia 12/19/2019  . Elevated sed rate 11/26/2019  . Elevated C-reactive protein (CRP) 11/26/2019  . Hypoglycemia 02/07/2019  . Hypotension 02/07/2019  . Lactic acidosis 02/07/2019  . Right ankle pain 02/07/2019  . Chronic obstructive pulmonary disease ([place]) 02/05/2019  . Seizure ([place]) 07/31/2018  . Vitamin D deficiency 05/31/2017  . Gastroesophageal reflux disease 05/24/2017  . Abnormal uterine bleeding (AUB) 05/17/2017  . Anemia of chronic disease 04/06/2017  . Knee pain, chronic 03/27/2016  . Controlled type 2 diabetes mellitus without complication, with long-term current use of insulin ([place]) 03/27/2016  . Essential hypertension 03/27/2016  . Morbid obesity (  [place]) 03/27/2016  . Irritable bowel syndrome with constipation 03/27/2016  . Tobacco dependence 03/27/2016  . CKD (chronic kidney disease) stage 2, GFR 60-89 ml/min 03/25/2016  . Arthropathy, lower leg 05/04/2013  . CTS (carpal tunnel syndrome) 05/04/2013  . Peripheral edema 05/04/2013  . Chronic pain 05/13/2012  . Diabetic gastroparesis ([place]) 11/06/2006  . Hot flashes  11/06/2006    Past Surgical History:  Procedure Laterality Date  . CESAREAN SECTION     x 1. for twins  . DILATION AND CURETTAGE OF UTERUS N/A 08/20/2019   Procedure: DILATATION AND CURETTAGE;  Surgeon: Emily Filbert, MD;  Location: [facility];  Service: Gynecology;  Laterality: N/A;  . ENDOMETRIAL ABLATION N/A 08/20/2019   Procedure: [place] Ablation;  Surgeon: Emily Filbert, MD;  Location: [facility];  Service: Gynecology;  Laterality: N/A;  . EYE SURGERY Bilateral    laser right and cataract removed left eye  . RADIOLOGY WITH ANESTHESIA N/A 09/16/2019   Procedure: MRI WITH ANESTHESIA   L SPINE WITHOUT CONTRAST, T SPINE WITHOUT CONTRAST , CERVICAL WITHOUT CONTRAST;  Surgeon: Radiologist, Medication, MD;  Location: [facility];  Service: Radiology;  Laterality: N/A;  . TUBAL LIGATION     interval BTL  . UPPER GI ENDOSCOPY  07/2017     OB History    Gravida  3   Para      Term      Preterm      AB      Living  3     SAB      IAB      Ectopic      Multiple      Live Births  3        Obstetric Comments  Vaginal x 1 and then c-section for twins        Family History  Problem Relation Age of Onset  . Diabetes Mother   . Hypertension Mother     Social History   Tobacco Use  . Smoking status: Former Smoker    Packs/day: 0.25    Years: 26.00    Pack years: 6.50    Types: Cigarettes    Quit date: 09/13/2020    Years since quitting: 0.1  . Smokeless tobacco: Never Used  . Tobacco comment: 4-5 cigarettes/day  Vaping Use  . Vaping Use: Never used  Substance Use Topics  . Alcohol use: No  . Drug use: No    Home Medications Prior to Admission medications   Medication Sig Start Date End Date Taking? Authorizing Provider  albuterol (PROVENTIL) (2.5 MG/3ML) 0.083% nebulizer solution Take 3 mLs (2.5 mg total) by nebulization every 6 (six) hours as needed for wheezing or shortness of breath. 09/27/20  Yes Icard, Bradley L, DO  albuterol (VENTOLIN HFA) 108 (90 Base) MCG/ACT  inhaler Inhale 2 puffs into the lungs every 6 (six) hours as needed for wheezing or shortness of breath. 09/27/20  Yes Icard, Octavio Graves, DO  amitriptyline (ELAVIL) 75 MG tablet Take 1 tablet (75 mg total) by mouth at bedtime. 10/13/20  Yes Raulkar, Clide Deutscher, MD  budesonide-formoterol (SYMBICORT) 160-4.5 MCG/ACT inhaler INHALE 2 PUFFS INTO THE LUNGS 2 (TWO) TIMES DAILY. Patient taking differently: Inhale 2 puffs into the lungs 2 (two) times daily. 09/27/20  Yes Icard, Octavio Graves, DO  buPROPion (WELLBUTRIN SR) 150 MG 12 hr tablet Take 1 tablet (150 mg total) by mouth 2 (two) times daily. 07/19/20 07/19/21 Yes Vevelyn Francois, NP  cetirizine (ZYRTEC) 10 MG tablet Take  1 tablet (10 mg total) by mouth daily. 10/07/20  Yes Vevelyn Francois, NP  diclofenac Sodium (VOLTAREN) 1 % GEL Apply 4 g topically 4 (four) times daily as needed (pain). 08/15/20  Yes [provider]  ferrous sulfate 325 (65 FE) MG tablet Take 1 tablet (325 mg total) by mouth 3 (three) times daily with meals. Patient taking differently: Take 325 mg by mouth daily with breakfast. 12/02/19  Yes Vevelyn Francois, NP  fluticasone (FLONASE) 50 MCG/ACT nasal spray Place 2 sprays into both nostrils daily. 11/24/19  Yes Vevelyn Francois, NP  furosemide (LASIX) 40 MG tablet Take 1 tablet (40 mg total) by mouth 2 (two) times daily. 08/11/20  Yes Lorretta Harp, MD  Glucosamine Sulfate 1000 MG CAPS Take 1 capsule (1,000 mg total) by mouth 2 (two) times daily. Patient taking differently: Take 1,000 mg by mouth 2 (two) times daily. 08/22/19  Yes Hilts, Legrand Como, MD  hydrocortisone 2.5 % cream Apply topically 2 (two) times daily. Patient taking differently: Apply 1 application topically 2 (two) times daily. 09/22/20  Yes Vevelyn Francois, NP  HYDROmorphone (DILAUDID) 2 MG tablet Take 1 tablet (2 mg total) by mouth every 8 (eight) hours as needed for severe pain. 10/27/20  Yes Aletha Halim, MD  hydroquinone 4 % cream APPLY TOPICALLY TWICE  DAILY Patient taking differently: Apply 1 application topically 2 (two) times daily. 09/13/20  Yes King, Diona Foley, NP  Insulin Lispro Prot & Lispro (HUMALOG MIX 75/25 KWIKPEN) (75-25) 100 UNIT/ML Kwikpen Inject 120 Units into the skin 2 (two) times daily. INJECT 110 UNITS EVERY 12 HOURS Patient taking differently: Inject 110 Units into the skin 2 (two) times daily. 09/13/20 09/13/21 Yes King, Diona Foley, NP  lidocaine (XYLOCAINE) 5 % ointment Apply 1 application topically as needed. Patient taking differently: Apply 1 application topically as needed for mild pain. 09/16/20  Yes Patel, Donika K, DO  losartan (COZAAR) 50 MG tablet TAKE 1 TABLET($RemoveBefor'50MG'WPVzBnwnhtcY$  TOTAL) BY MOUTH EVERY DAY Patient taking differently: Take 50 mg by mouth daily. 11/08/20  Yes Vevelyn Francois, NP  metoprolol succinate (TOPROL-XL) 200 MG 24 hr tablet Take 1 tablet (200 mg total) by mouth daily. Take with or immediately following a meal. Patient taking differently: Take 200 mg by mouth daily. 10/14/20  Yes Patwardhan, Manish J, MD  nicotine (NICODERM CQ) 21 mg/24hr patch Place 1 patch (21 mg total) onto the skin daily. 07/12/20  Yes Lauraine Rinne, NP  norethindrone (AYGESTIN) 5 MG tablet Take 2 tablets (10 mg total) by mouth in the morning, at noon, in the evening, and at bedtime. With bleeding 10/19/20  Yes Aletha Halim, MD  omeprazole (PRILOSEC) 40 MG capsule TAKE 1 CAPSULE($RemoveBefore'40MG'TPkZVcwhLodVG$  TOTAL) BY MOUTH EVERY DAY Patient taking differently: Take 40 mg by mouth daily. 09/23/20  Yes King, Diona Foley, NP  potassium chloride SA (KLOR-CON) 20 MEQ tablet Take 20 mEq by mouth daily. 11/16/20  Yes [provider]  promethazine (PHENERGAN) 12.5 MG tablet Take 1 tablet (12.5 mg total) by mouth every 6 (six) hours as needed for nausea or vomiting. 03/10/20  Yes Patrecia Pour, MD  rOPINIRole (REQUIP) 0.25 MG tablet Take 1 tablet (0.25 mg total) by mouth 3 (three) times daily. 11/19/20 11/19/21 Yes Raulkar, Clide Deutscher, MD  rosuvastatin (CRESTOR) 5 MG tablet  Take 1 tablet (5 mg total) by mouth daily. 08/11/20  Yes Lorretta Harp, MD  saxagliptin HCl (ONGLYZA) 2.5 MG TABS tablet Take 1 tablet (2.5 mg total) by  mouth daily. 12/02/19  Yes Vevelyn Francois, NP  spironolactone (ALDACTONE) 50 MG tablet Take 1 tablet (50 mg total) by mouth daily. 09/01/20 11/30/20 Yes Patwardhan, Manish J, MD  sucralfate (CARAFATE) 1 GM/10ML suspension Take 10 mLs (1 g total) by mouth 4 (four) times daily -  with meals and at bedtime. 07/04/20  Yes Bonnielee Haff, MD  Tiotropium Bromide Monohydrate (SPIRIVA RESPIMAT) 2.5 MCG/ACT AERS Inhale 2 puffs into the lungs daily. 09/27/20  Yes Icard, Bradley L, DO  tiZANidine (ZANAFLEX) 4 MG tablet TAKE 1 TABLET(4 MG) BY MOUTH THREE TIMES DAILY Patient taking differently: Take 4 mg by mouth 3 (three) times daily. 11/19/20  Yes Vevelyn Francois, NP  Turmeric 500 MG CAPS Take 500 mg by mouth 2 (two) times daily. 08/22/19  Yes Hilts, Legrand Como, MD  Vitamin D, Ergocalciferol, (DRISDOL) 1.25 MG (50000 UNIT) CAPS capsule Take 1 capsule (50,000 Units total) by mouth every 7 (seven) days. Patient taking differently: Take 50,000 Units by mouth every Friday. 12/02/19  Yes Vevelyn Francois, NP  ACCU-CHEK GUIDE test strip USE AS DIRECTED UP TO FOUR TIMES DAILY 11/11/20   Vevelyn Francois, NP  blood glucose meter kit and supplies KIT 1 each by Other route See admin instructions. Dispense based on patient and insurance preference. Use up to four times daily as directed. (FOR ICD-9 250.00, 250.01). 09/02/20   Vevelyn Francois, NP  Blood Glucose Monitoring Suppl (TRUE METRIX METER) w/Device KIT 1 each by Does not apply route 4 (four) times daily -  before meals and at bedtime. 01/21/18   Dorena Dew, FNP  Continuous Blood Gluc Receiver (FREESTYLE LIBRE 14 DAY READER) DEVI 1 application by Does not apply route every 14 (fourteen) days. 08/13/20 08/13/21  Vevelyn Francois, NP  fluconazole (DIFLUCAN) 150 MG tablet Take 1 tablet (150 mg total) by mouth daily. Take  one tab now and one in 3 days if still having symptoms Patient not taking: Reported on 11/20/2020 11/12/20   Loura Halt A, NP  Insulin Pen Needle (PEN NEEDLES) 30G X 5 MM MISC 1 Units by Does not apply route as directed. 12/02/19   Vevelyn Francois, NP  metoCLOPramide (REGLAN) 10 MG tablet Take 1 tablet (10 mg total) by mouth 4 (four) times daily -  before meals and at bedtime. 10/17/20 11/16/20  Donne Hazel, MD  TRUEplus Lancets 28G MISC 1 each by Other route in the morning, at noon, in the evening, and at bedtime. 02/12/20   [provider]  hydrALAZINE (APRESOLINE) 50 MG tablet Take 1 tablet (50 mg total) by mouth 3 (three) times daily. 02/03/20 02/20/20  Thurnell Lose, MD    Allergies    Ketoprofen, Aspirin, Gabapentin, Ibuprofen, Liraglutide, Naproxen, Omeprazole-sodium bicarbonate, Sulfa antibiotics, and Tramadol  Review of Systems   Review of Systems  Unable to perform ROS: Mental status change (level 5 caveat)    Physical Exam Updated Vital Signs BP (!) 189/64   Pulse (!) 110   Temp 98.3 F (36.8 C) (Oral)   Resp (!) 26   SpO2 100%   Physical Exam Constitutional:      Appearance: She is obese.     Comments: Somnolent, cannot stay awake Snoring  HENT:     Head: Normocephalic and atraumatic.  Eyes:     Conjunctiva/sclera: Conjunctivae normal.     Pupils: Pupils are equal, round, and reactive to light.  Cardiovascular:     Rate and Rhythm: Regular rhythm. Tachycardia present.  Comments: HR 125 and regular Pulmonary:     Effort: Pulmonary effort is normal. No respiratory distress.     Comments: 94% when awakened with stimulation, 88-90% when sleeping/snoring, 2L [place] placed Abdominal:     General: There is no distension.     Tenderness: There is no abdominal tenderness.  Skin:    General: Skin is warm and dry.  Neurological:     Mental Status: She is alert.     Comments: Moves all extremities to command 5/5 strength Speech mumbled No obvious facial droop      ED Results / Procedures / Treatments   Labs (all labs ordered are listed, but only abnormal results are displayed) Labs Reviewed  BASIC METABOLIC PANEL - Abnormal; Notable for the following components:      Result Value   Glucose, Bld 185 (*)    Creatinine, Ser 1.25 (*)    GFR, Estimated 55 (*)    All other components within normal limits  CBC WITH DIFFERENTIAL/PLATELET - Abnormal; Notable for the following components:   WBC 10.8 (*)    Hemoglobin 9.9 (*)    HCT 33.0 (*)    MCV 72.7 (*)    MCH 21.8 (*)    RDW 23.3 (*)    Neutro Abs 8.3 (*)    All other components within normal limits  BLOOD GAS, VENOUS - Abnormal; Notable for the following components:   pCO2, Ven 41.5 (*)    pO2, Ven 49.7 (*)    All other components within normal limits  AMMONIA - Abnormal; Notable for the following components:   Ammonia 43 (*)    All other components within normal limits  LACTIC ACID, PLASMA - Abnormal; Notable for the following components:   Lactic Acid, Venous 2.8 (*)    All other components within normal limits  LACTIC ACID, PLASMA - Abnormal; Notable for the following components:   Lactic Acid, Venous 2.1 (*)    All other components within normal limits  APTT - Abnormal; Notable for the following components:   aPTT <20 (*)    All other components within normal limits  D-DIMER, QUANTITATIVE (NOT AT [facility]) - Abnormal; Notable for the following components:   D-Dimer, Quant 0.56 (*)    All other components within normal limits  LACTATE DEHYDROGENASE - Abnormal; Notable for the following components:   LDH 255 (*)    All other components within normal limits  TRIGLYCERIDES - Abnormal; Notable for the following components:   Triglycerides 280 (*)    All other components within normal limits  FERRITIN - Abnormal; Notable for the following components:   Ferritin 8 (*)    All other components within normal limits  CULTURE, BLOOD (SINGLE)  URINE CULTURE  CULTURE, BLOOD (SINGLE)   BETA-HYDROXYBUTYRIC ACID  HEPATIC FUNCTION PANEL  URINALYSIS, ROUTINE W REFLEX MICROSCOPIC  PROTIME-INR  BRAIN NATRIURETIC PEPTIDE  PROCALCITONIN  FIBRINOGEN  C-REACTIVE PROTEIN  RAPID URINE DRUG SCREEN, HOSP PERFORMED  CBC  CREATININE, SERUM  CBC WITH DIFFERENTIAL/PLATELET  COMPREHENSIVE METABOLIC PANEL  C-REACTIVE PROTEIN  D-DIMER, QUANTITATIVE (NOT AT [facility])  FERRITIN  MAGNESIUM  PHOSPHORUS  I-STAT BETA HCG BLOOD, ED ([facility], [facility], [facility] ONLY)  TROPONIN I (HIGH SENSITIVITY)    EKG None  Radiology CT Head Wo Contrast  Result Date: 11/20/2020 CLINICAL DATA:  Syncopal episode EXAM: CT HEAD WITHOUT CONTRAST TECHNIQUE: Contiguous axial images were obtained from the base of the skull through the vertex without intravenous contrast. COMPARISON:  July 02, 2020 FINDINGS: Brain: No  evidence of acute infarction, hemorrhage, hydrocephalus, extra-axial collection or mass lesion/mass effect. Vascular: No hyperdense vessel or unexpected calcification. Skull: Normal. Negative for fracture or focal lesion. Sinuses/Orbits: No acute finding. Other: None. IMPRESSION: No acute intracranial abnormality. Electronically Signed   By: Valentino Saxon MD   On: 11/20/2020 14:16   CT Angio Chest PE W and/or Wo Contrast  Result Date: 11/20/2020 CLINICAL DATA:  Syncopal episode EXAM: CT ANGIOGRAPHY CHEST WITH CONTRAST TECHNIQUE: Multidetector CT imaging of the chest was performed using the standard protocol during bolus administration of intravenous contrast. Multiplanar CT image reconstructions and MIPs were obtained to evaluate the vascular anatomy. CONTRAST:  162mL OMNIPAQUE IOHEXOL 350 MG/ML SOLN COMPARISON:  None. FINDINGS: Cardiovascular: Nondiagnostic exam for pulmonary embolism. Patient is unable to follow instructions and artifact related to arm positioning as well as respiratory motion markedly degrade quality. No central pulmonary embolism. No pericardial effusion. Heart is enlarged. Aorta is normal in  caliber. Mediastinum/Nodes: Visualized thyroid is unremarkable. No axillary or mediastinal adenopathy. Lungs/Pleura: No pleural effusion or pneumothorax. Bibasilar atelectasis. Scattered areas of peripheral ground-glass opacities (series 8, image 72). Upper Abdomen: No acute abnormality. Musculoskeletal: No acute osseous abnormality. Review of the MIP images confirms the above findings. IMPRESSION: 1. Nondiagnostic exam for pulmonary embolism due to patient inability to follow instructions/respiratory motion and artifact related to arm positioning. No central pulmonary embolism. If persistent concern for pulmonary embolism, recommend dedicated V/Q scan versus repeat scan when patient is able to follow instructions. 2. Scattered areas of peripheral ground-glass opacities. These are likely infectious or inflammatory in etiology. COVID-19 infection could present similarly. Electronically Signed   By: Valentino Saxon MD   On: 11/20/2020 14:25   DG Chest Port 1 View  Result Date: 11/20/2020 CLINICAL DATA:  Near syncopal episode.  Question sepsis. EXAM: PORTABLE CHEST 1 VIEW COMPARISON:  One-view chest x-ray 11/12/2020 FINDINGS: Heart is enlarged. Mild pulmonary vascular congestion is present. Lung volumes are low. No significant airspace consolidation is present. IMPRESSION: Cardiomegaly and mild pulmonary vascular congestion. Electronically Signed   By: San Morelle M.D.   On: 11/20/2020 11:23    Procedures .Critical Care Performed by: Wyvonnia Dusky, MD Authorized by: Wyvonnia Dusky, MD   Critical care provider statement:    Critical care time (minutes):  45   Critical care was necessary to treat or prevent imminent or life-threatening deterioration of the following conditions:  Sepsis   Critical care was time spent personally by me on the following activities:  Discussions with consultants, evaluation of patient's response to treatment, examination of patient, ordering and performing  treatments and interventions, ordering and review of laboratory studies, ordering and review of radiographic studies, pulse oximetry, re-evaluation of patient's condition, obtaining history from patient or surrogate and review of old charts     Medications Ordered in ED Medications  lactated ringers infusion ( Intravenous Rate/Dose Change 11/20/20 1529)  losartan (COZAAR) tablet 50 mg (50 mg Oral Given 11/20/20 1744)  metoprolol succinate (TOPROL-XL) 24 hr tablet 200 mg (has no administration in time range)  rosuvastatin (CRESTOR) tablet 5 mg (has no administration in time range)  spironolactone (ALDACTONE) tablet 50 mg (has no administration in time range)  furosemide (LASIX) tablet 40 mg (has no administration in time range)  buPROPion (WELLBUTRIN SR) 12 hr tablet 150 mg (has no administration in time range)  sucralfate (CARAFATE) 1 GM/10ML suspension 1 g (has no administration in time range)  ferrous sulfate tablet 325 mg (has no administration in time range)  loratadine (CLARITIN) tablet 10 mg (10 mg Oral Given 11/20/20 1744)  fluticasone (FLONASE) 50 MCG/ACT nasal spray 2 spray (has no administration in time range)  promethazine (PHENERGAN) tablet 12.5 mg (has no administration in time range)  Insulin Lispro Prot & Lispro (HUMALOG 75/25 MIX) (75-25) 100 UNIT/ML KwikPen 50 Units (has no administration in time range)  insulin aspart (novoLOG) injection 0-15 Units (has no administration in time range)  enoxaparin (LOVENOX) injection 40 mg (has no administration in time range)  ondansetron (ZOFRAN) tablet 4 mg (has no administration in time range)    Or  ondansetron (ZOFRAN) injection 4 mg (has no administration in time range)  remdesivir 200 mg in sodium chloride 0.9% 250 mL IVPB (has no administration in time range)    Followed by  remdesivir 100 mg in sodium chloride 0.9 % 100 mL IVPB (has no administration in time range)  ascorbic acid (VITAMIN C) tablet 500 mg (has no administration in  time range)  zinc sulfate capsule 220 mg (has no administration in time range)  guaiFENesin-dextromethorphan (ROBITUSSIN DM) 100-10 MG/5ML syrup 10 mL (has no administration in time range)  sodium chloride 0.9 % bolus 1,000 mL (1,000 mLs Intravenous Bolus 11/20/20 1055)  hydrALAZINE (APRESOLINE) injection 10 mg (10 mg Intravenous Given 11/20/20 1109)  ceFEPIme (MAXIPIME) 2 g in sodium chloride 0.9 % 100 mL IVPB (0 g Intravenous Stopped 11/20/20 1429)  metroNIDAZOLE (FLAGYL) IVPB 500 mg (0 mg Intravenous Stopped 11/20/20 1445)  vancomycin (VANCOCIN) IVPB 1000 mg/200 mL premix (1,000 mg Intravenous New Bag/Given 11/20/20 1535)  sodium chloride 0.9 % bolus 1,000 mL (1,000 mLs Intravenous Bolus 11/20/20 1323)  iohexol (OMNIPAQUE) 350 MG/ML injection 100 mL (100 mLs Intravenous Contrast Given 11/20/20 1332)  metoprolol tartrate (LOPRESSOR) injection 5 mg (5 mg Intravenous Given 11/20/20 1402)  metoprolol succinate (TOPROL-XL) 24 hr tablet 100 mg (100 mg Oral Given 11/20/20 1529)    ED Course  I have reviewed the triage vital signs and the nursing notes.  Pertinent labs & imaging results that were available during my care of the patient were reviewed by me and considered in my medical decision making (see chart for details).  This patient presents to the Emergency Department with complaint of altered mental status.  This involves an extensive number of treatment options, and is a complaint that carries with it a high risk of complications and morbidity.  The differential diagnosis includes hypoglycemia vs metabolic encephalopathy vs infection (including cystitis) vs ICH vs stroke vs polypharmacy vs other  Also Covid positive.  Airborne precautions ordered.  I ordered, reviewed, and interpreted labs, including lactate 2.8, WBC 10.8, hgb 9.9, BMP near baseline, anion gap 12, glucose 185, LFT wnl's, VBG 7.4 pH, pCO2 41, Bicarb 26.  BNP wnl.  UA pending I ordered medication IV antibiotics, IV fluid bolus (30 cc/kg based  ideal body weight), IV hydralazine and IV metoprolol for hypertension and tachycardia I ordered imaging studies which included dg chest, CThead and CT PE  I independently visualized and interpreted imaging which showed cardiomegaly, no acute ICH or brain injury, and no evident large PE, although motion-limited CT scan, and the monitor tracing which showed sinus tachycardia Additional history was obtained from patient's son by phone Previous records obtained and reviewed showing recent hospitalization for metabolic encephalopathy I personally reviewed the patients ECG which showed sinus tachycardia rhythm with no acute ischemic findings  Based on this clinical workup, I suspect the patient has a recurring metabolic encephalopathy or covid encephalopathy, but cannot exclude  the possibility of infection or sepsis.  Sepsis protocol ordered  - HR improved with IV fluids. No signs of shock at this time.  She may have baseline tachycardia if she is prescribed high dose metoprolol at home.  Less likely acute PE with this workup.  No evidence of hypercapneic narcosis, although she very likely has an element of significant OSA with her body habitus and audible snoring on exam.  She will need hospitalization for continued monitoring, sepsis rule out.  CHANTAL WORTHEY was evaluated in Emergency Department on 11/20/2020 for the symptoms described in the history of present illness. She was evaluated in the context of the global COVID-19 pandemic, which necessitated consideration that the patient might be at risk for infection with the SARS-CoV-2 virus that causes COVID-19. Institutional protocols and algorithms that pertain to the evaluation of patients at risk for COVID-19 are in a state of rapid change based on information released by regulatory bodies including the CDC and federal and state organizations. These policies and algorithms were followed during the patient's care in the ED.   Clinical Course as of 11/20/20  1816  Sat Nov 20, 2020  1103 Spoke to son - history updated.  Appears this presentation may be similar to Dec/Jan presentation for metabolic encephalopathy, although complicated by acute covid diagnosis this time as well [MT]  1347 Admitted to hospitalist - agreed to give 5 mg IV metoprolol as patient on 200 mg XR daily [MT]    Clinical Course User Index [MT] Wyvonnia Dusky, MD    Final Clinical Impression(s) / ED Diagnoses Final diagnoses:  COVID-19  Altered mental status, unspecified altered mental status type  Sepsis, due to unspecified organism, unspecified whether acute organ dysfunction present [facility])    Rx / DC Orders ED Discharge Orders    None       Lexianna Weinrich, Carola Rhine, MD 11/20/20 1816

## 2020-11-21 DIAGNOSIS — R4182 Altered mental status, unspecified: Secondary | ICD-10-CM

## 2020-11-21 DIAGNOSIS — U071 COVID-19: Secondary | ICD-10-CM | POA: Diagnosis not present

## 2020-11-21 DIAGNOSIS — G9341 Metabolic encephalopathy: Secondary | ICD-10-CM | POA: Diagnosis not present

## 2020-11-21 LAB — GLUCOSE, CAPILLARY
Glucose-Capillary: 167 mg/dL — ABNORMAL HIGH (ref 70–99)
Glucose-Capillary: 129 mg/dL — ABNORMAL HIGH (ref 70–99)
Glucose-Capillary: 116 mg/dL — ABNORMAL HIGH (ref 70–99)
Glucose-Capillary: 172 mg/dL — ABNORMAL HIGH (ref 70–99)
Glucose-Capillary: 164 mg/dL — ABNORMAL HIGH (ref 70–99)

## 2020-11-21 LAB — BLOOD CULTURE ID PANEL (REFLEXED) - BCID2

## 2020-11-21 LAB — C-REACTIVE PROTEIN: CRP: 0.7 mg/dL (ref <1.0)

## 2020-11-21 LAB — COMPREHENSIVE METABOLIC PANEL
ALT: 11 U/L (ref 0–44)
AST: 27 U/L (ref 15–41)
Albumin: 3.2 g/dL — ABNORMAL LOW (ref 3.5–5.0)
Alkaline Phosphatase: 65 U/L (ref 38–126)
Anion gap: 12 (ref 5–15)
BUN: 14 mg/dL (ref 6–20)
CO2: 23 mmol/L (ref 22–32)
Calcium: 8.6 mg/dL — ABNORMAL LOW (ref 8.9–10.3)
Chloride: 106 mmol/L (ref 98–111)
Creatinine, Ser: 1.12 mg/dL — ABNORMAL HIGH (ref 0.44–1.00)
GFR, Estimated: >60 mL/min (ref >60)
Glucose, Bld: 117 mg/dL — ABNORMAL HIGH (ref 70–99)
Potassium: 3.5 mmol/L (ref 3.5–5.1)
Sodium: 141 mmol/L (ref 135–145)
Total Bilirubin: 0.7 mg/dL (ref 0.3–1.2)
Total Protein: 6.7 g/dL (ref 6.5–8.1)

## 2020-11-21 LAB — PHOSPHORUS: Phosphorus: 2.7 mg/dL (ref 2.5–4.6)

## 2020-11-21 LAB — FERRITIN: Ferritin: 10 ng/mL — ABNORMAL LOW (ref 11–307)

## 2020-11-21 LAB — MAGNESIUM: Magnesium: 1.5 mg/dL — ABNORMAL LOW (ref 1.7–2.4)

## 2020-11-21 LAB — D-DIMER, QUANTITATIVE: D-Dimer, Quant: 0.58 ug/mL-FEU — ABNORMAL HIGH (ref 0.00–0.50)

## 2020-11-21 MED ORDER — LABETALOL HCL 200 MG PO TABS
200.0000 mg | ORAL_TABLET | Freq: Two times a day (BID) | ORAL | Status: DC
  Administered 2020-11-21 – 2020-11-22 (×4): 200 mg via ORAL
  Filled 2020-11-21 (×4): qty 1

## 2020-11-21 MED ORDER — NICOTINE 21 MG/24HR TD PT24
21.0000 mg | MEDICATED_PATCH | Freq: Every day | TRANSDERMAL | Status: DC
  Administered 2020-11-21 – 2020-11-23 (×3): 21 mg via TRANSDERMAL
  Filled 2020-11-21 (×2): qty 1

## 2020-11-21 MED ORDER — HYDROMORPHONE HCL 2 MG PO TABS
2.0000 mg | ORAL_TABLET | Freq: Three times a day (TID) | ORAL | Status: DC | PRN
  Administered 2020-11-21 – 2020-11-23 (×4): 2 mg via ORAL
  Filled 2020-11-21 (×5): qty 1

## 2020-11-21 MED ORDER — HYDRALAZINE HCL 20 MG/ML IJ SOLN
10.0000 mg | INTRAMUSCULAR | Status: DC | PRN
  Administered 2020-11-21: 10 mg via INTRAVENOUS
  Filled 2020-11-21: qty 1

## 2020-11-21 MED ORDER — TIZANIDINE HCL 4 MG PO TABS
2.0000 mg | ORAL_TABLET | Freq: Four times a day (QID) | ORAL | Status: DC | PRN
  Administered 2020-11-21 – 2020-11-23 (×5): 2 mg via ORAL
  Filled 2020-11-21 (×5): qty 1

## 2020-11-21 NOTE — ED Notes (Signed)
Report called and given to nurse.  

## 2020-11-21 NOTE — Progress Notes (Signed)
   11/21/20 1140  Assess: MEWS Score  Temp 98.3 F (36.8 C)  BP (!) 210/94  Pulse Rate 93  Level of Consciousness Alert  SpO2 100 %  O2 Device Room Air  Assess: MEWS Score  MEWS Temp 0  MEWS Systolic 2  MEWS Pulse 0  MEWS RR 0  MEWS LOC 0  MEWS Score 2  MEWS Score Color Yellow  Assess: if the MEWS score is Yellow or Red  Were vital signs taken at a resting state? Yes  Focused Assessment Change from prior assessment (see assessment flowsheet)  Early Detection of Sepsis Score *See Row Information* Medium  MEWS guidelines implemented *See Row Information* Yes  Take Vital Signs  Increase Vital Sign Frequency  Yellow: Q 2hr X 2 then Q 4hr X 2, if remains yellow, continue Q 4hrs  Escalate  MEWS: Escalate Yellow: discuss with charge nurse/RN and consider discussing with provider and RRT  Notify: Charge Nurse/RN  Name of Charge Nurse/RN Notified Cristopher Peru, RN  Date Charge Nurse/RN Notified 11/21/20  Time Charge Nurse/RN Notified 1135  Notify: Provider  Provider Name/Title Dr. Renato Battles  Date Provider Notified 11/21/20

## 2020-11-21 NOTE — Progress Notes (Signed)
PHARMACY - PHYSICIAN COMMUNICATION CRITICAL VALUE ALERT - BLOOD CULTURE IDENTIFICATION (BCID)  Jill Shaw is an 45 y.o. female who presented to [facility] on 11/20/2020 with a chief complaint of AMS    Assessment:  Gram positive cocci in chains in anaerobic bottle only (1 of 2 bottles drawn) is possible contaminant  Name of physician (or Provider) Contacted: Reyes Ivan  Current antibiotics: none  Changes to prescribed antibiotics recommended:  No antibiotic recommended  Results for orders placed or performed during the hospital encounter of 11/20/20  Blood Culture ID Panel (Reflexed) (Collected: 11/20/2020 10:42 AM)  Result Value Ref Range   Enterococcus faecalis NOT DETECTED NOT DETECTED   Enterococcus Faecium NOT DETECTED NOT DETECTED   Listeria monocytogenes NOT DETECTED NOT DETECTED   Staphylococcus species NOT DETECTED NOT DETECTED   Staphylococcus aureus (BCID) NOT DETECTED NOT DETECTED   Staphylococcus epidermidis NOT DETECTED NOT DETECTED   Staphylococcus lugdunensis NOT DETECTED NOT DETECTED   Streptococcus species DETECTED (A) NOT DETECTED   Streptococcus agalactiae NOT DETECTED NOT DETECTED   Streptococcus pneumoniae NOT DETECTED NOT DETECTED   Streptococcus pyogenes NOT DETECTED NOT DETECTED   A.calcoaceticus-baumannii NOT DETECTED NOT DETECTED   Bacteroides fragilis NOT DETECTED NOT DETECTED   Enterobacterales NOT DETECTED NOT DETECTED   Enterobacter cloacae complex NOT DETECTED NOT DETECTED   Escherichia coli NOT DETECTED NOT DETECTED   Klebsiella aerogenes NOT DETECTED NOT DETECTED   Klebsiella oxytoca NOT DETECTED NOT DETECTED   Klebsiella pneumoniae NOT DETECTED NOT DETECTED   Proteus species NOT DETECTED NOT DETECTED   Salmonella species NOT DETECTED NOT DETECTED   Serratia marcescens NOT DETECTED NOT DETECTED   Haemophilus influenzae NOT DETECTED NOT DETECTED   Neisseria meningitidis NOT DETECTED NOT DETECTED   Pseudomonas aeruginosa NOT DETECTED NOT  DETECTED   Stenotrophomonas maltophilia NOT DETECTED NOT DETECTED   Candida albicans NOT DETECTED NOT DETECTED   Candida auris NOT DETECTED NOT DETECTED   Candida glabrata NOT DETECTED NOT DETECTED   Candida krusei NOT DETECTED NOT DETECTED   Candida parapsilosis NOT DETECTED NOT DETECTED   Candida tropicalis NOT DETECTED NOT DETECTED   Cryptococcus neoformans/gattii NOT DETECTED NOT DETECTED    Royce Macadamia 11/21/2020  8:57 PM

## 2020-11-21 NOTE — Progress Notes (Addendum)
Patient alert/oriented, with elevated BP and also C/O pain, Dr. Renato Battles notified orders written, will continue to assess patient.

## 2020-11-21 NOTE — Progress Notes (Addendum)
PROGRESS NOTE    Patient: Jill Shaw                            PCP: Vevelyn Francois, NP                    DOB: 02-28-1976            DOA: 11/20/2020 PPI:[id number]             DOS: 11/21/2020, 9:55 AM   LOS: 1 day   Date of Service: The patient was seen and examined on 11/21/2020  Subjective:   The patient was seen and examined this morning. Stable at this time. Complained of generalized weaknesses, denies any chest pain or shortness of breath at rest Shortness of breath with exertion Remains on room air satting 100% Otherwise no issues overnight .  Brief Narrative:   Jill Shaw is a 45 y.o. female with medical history significant of HTN, DM2, obesity. Patient is a poor historian. Hx from son. He reports that yesterday his mom was in a depressed mood d/t illness of another family member.  Family member found her on the floor. It appeared as if she fell out of the bed. She was very slow to get up with assistance and she seem to be sluggish in all her responses. The son report that this is how she acts when her sugar is low, but they were unable to check her glucose level. They became concerned and called for EMS  ED Course:  Upon initial evaluation in edition met early early sepsis criteria. LA. 2.1, altered mental status, tachycardia .Marland Kitchen Satting 100% on room air  Started on broad spec abx. CTH was negative. CTA PE was negative for clot but limited by motion. VBG did not show CO2 retention and ammonia was only mildly elevated.  Urine drug screen negative, Not vaccinated against Covid  Assessment & Plan:   Active Problems:   Acute metabolic encephalopathy   Assessment/Plan Acute metabolic encephalopathy     -Unknown etiology possible vasovagal versus brief hyperglycemic state due to poor p.o. intake Less likely due to Covid infection patient was never recorded to be hypoxic -No signs of CO2 retention, hypoventilation, ABG reviewed PCO2 and ammonia mildly elevated  CTA is  unremarkable     - started on broad spec abx; procal is negative, no signs of bacterial infection therefore withholding antibiotic coverage     -Mentation back to baseline alert oriented x4     -UA UDS negative  COVID 19 infection -without hypoxia or respiratory failure SARS-CoV-2 + November 12, 2020  -Remains on room air, satting 100%, not any respiratory distress  Continue supportive care multivitamins,   remdesivir has been initiated on admission -but her CRP is normal 0.5  Will cont.  Remdesivir for may be 3 days   Monitoring inflammatory markers  Sepsis  - Sepsis was ruled out --     -On admission met sepsis criteria: WITH tachycardia, tachypnea, presumed source: COVID, mildly elevated lactic acid 2.1; mentation change     -Patient was empirically started on Remdes, vitamins     - follow inflammatory markers     -Remains on room air, stable satting 98-100%     -All sepsis physiology has resolved  We will continue to monitor very closely  HTN urgency - W H/O HTN     -Mentation back to baseline     -Patient's home  medication has been resumed -Blood pressure has been steadily improving    CKD3a     - at baseline Scr, follow  -Creatinine 1.25, 1.12  Morbid obesity     - diet/lifestyle changes; follow up outpatient Body mass index is 44.46 kg/m.  Patient has been advised on aggressive weight loss, healthy diet and exercise   Diabetes mellitus type 2     - SSI, DM diet, glucose checks     - A1c one month ago was 9.5% -Resuming home medication regimen today   Chronic pain syndrome -She is keep asking about her home pain medication and muscle relaxant -We have reintroduced her home medication of Dilaudid and muscle relaxants with reduced dose -We will monitor closely as she presented with altered mental status   DVT prophylaxis: lovenox  Code Status: FULL  Family Communication:  None at bedside today Consults called: None   Status is: Inpatient  Remains  inpatient appropriate because:Inpatient level of care appropriate due to severity of illness   Dispo: The patient is from: Home  Anticipated d/c is to: Home  Anticipated d/c date is: Likely in the morning patient remained stable  Patient currently is not medically stable to d/c.              Difficult to place patient No    ------------------------------------------------------------------------------------------------------------------------------------ Nutritional status:  The patient's BMI is: Body mass index is 44.46 kg/m. I agree with the assessment and plan as outlined below:   ------------------------------------------------------------------------------------------------------------------------------------ Cultures; Blood Cultures x 2  Urine Culture  >>>  Antimicrobials: None  11/20/20 Cefepime, Flagyl Vancomycin was given X1  in ED   Consultants: None    Level of care: Progressive   Procedures:   No admission procedures for hospital encounter.     Antimicrobials:  Anti-infectives (From admission, onward)   Start     Dose/Rate Route Frequency Ordered Stop   11/21/20 1000  remdesivir 100 mg in sodium chloride 0.9 % 100 mL IVPB       "Followed by" Linked Group Details   100 mg 200 mL/hr over 30 Minutes Intravenous Daily 11/20/20 1806 11/25/20 0959   11/20/20 1830  remdesivir 200 mg in sodium chloride 0.9% 250 mL IVPB       "Followed by" Linked Group Details   200 mg 580 mL/hr over 30 Minutes Intravenous Once 11/20/20 1806 11/20/20 2310   11/20/20 1245  ceFEPIme (MAXIPIME) 2 g in sodium chloride 0.9 % 100 mL IVPB        2 g 200 mL/hr over 30 Minutes Intravenous  Once 11/20/20 1244 11/20/20 1429   11/20/20 1245  metroNIDAZOLE (FLAGYL) IVPB 500 mg        500 mg 100 mL/hr over 60 Minutes Intravenous  Once 11/20/20 1244 11/20/20 1445   11/20/20 1245  vancomycin (VANCOCIN) IVPB 1000 mg/200 mL premix        1,000 mg 200  mL/hr over 60 Minutes Intravenous  Once 11/20/20 1244 11/20/20 2310       Medication:  . vitamin C  500 mg Oral Daily  . buPROPion  150 mg Oral BID  . enoxaparin (LOVENOX) injection  60 mg Subcutaneous Q24H  . ferrous sulfate  325 mg Oral Q breakfast  . fluticasone  2 spray Each Nare Daily  . furosemide  40 mg Oral BID  . insulin aspart  0-15 Units Subcutaneous TID WC  . insulin aspart protamine- aspart  50 Units Subcutaneous BID WC  . loratadine  10 mg Oral  Daily  . losartan  50 mg Oral Daily  . metoprolol  200 mg Oral Daily  . rosuvastatin  5 mg Oral Daily  . spironolactone  50 mg Oral Daily  . sucralfate  1 g Oral TID WC & HS  . zinc sulfate  220 mg Oral Daily    guaiFENesin-dextromethorphan, metoprolol tartrate, ondansetron **OR** ondansetron (ZOFRAN) IV, promethazine   Objective:   Vitals:   11/21/20 0132 11/21/20 0144 11/21/20 0242 11/21/20 0543  BP: (!) 160/79 (!) 167/97  (!) 159/87  Pulse: (!) 101 95  89  Resp: $Remo'20 18  18  'SexYc$ Temp: 98.6 F (37 C) 98.6 F (37 C)  98.8 F (37.1 C)  TempSrc: Oral Oral  Oral  SpO2: 100% 100%  99%  Weight:   117.5 kg   Height:   '5\' 4"'$  (1.626 m)     Intake/Output Summary (Last 24 hours) at 11/21/2020 9833 Last data filed at 11/21/2020 8250 Gross per 24 hour  Intake 2120.09 ml  Output 350 ml  Net 1770.09 ml   Filed Weights   11/21/20 0242  Weight: 117.5 kg     Examination:   Physical Exam  Constitution:  Alert, cooperative, no distress,  Appears calm and comfortable  Psychiatric: Normal and stable mood and affect, cognition intact,   HEENT: Normocephalic, PERRL, otherwise with in Normal limits  Chest:Chest symmetric Cardio vascular:  S1/S2, RRR, No murmure, No Rubs or Gallops  pulmonary: Clear to auscultation bilaterally, respirations unlabored, negative wheezes / crackles Abdomen: Soft, non-tender, non-distended, bowel sounds,no masses, no organomegaly Muscular skeletal: Limited exam - in bed, able to move all 4  extremities, Normal strength,  Neuro: CNII-XII intact. , normal motor and sensation, reflexes intact  Extremities: No pitting edema lower extremities, +2 pulses  Skin: Dry, warm to touch, negative for any Rashes, No open wounds Wounds: per nursing documentation    ------------------------------------------------------------------------------------------------------------------------------------    LABs:  CBC Latest Ref Rng & Units 11/20/2020 11/12/2020 10/17/2020  WBC 4.0 - 10.5 K/uL 10.8(H) 6.6 9.5  Hemoglobin 12.0 - 15.0 g/dL 9.9(L) 10.8(L) 9.0(L)  Hematocrit 36.0 - 46.0 % 33.0(L) 33.9(L) 30.7(L)  Platelets 150 - 400 K/uL 236 318 267   CMP Latest Ref Rng & Units 11/21/2020 11/20/2020 11/12/2020  Glucose 70 - 99 mg/dL 117(H) 185(H) 222(H)  BUN 6 - 20 mg/dL $Remove'14 15 10  'AmmyIUT$ Creatinine 0.44 - 1.00 mg/dL 1.12(H) 1.25(H) 1.24(H)  Sodium 135 - 145 mmol/L 141 143 133(L)  Potassium 3.5 - 5.1 mmol/L 3.5 4.1 4.6  Chloride 98 - 111 mmol/L 106 107 100  CO2 22 - 32 mmol/L 23 24 20(L)  Calcium 8.9 - 10.3 mg/dL 8.6(L) 9.1 9.4  Total Protein 6.5 - 8.1 g/dL 6.7 7.5 7.5  Total Bilirubin 0.3 - 1.2 mg/dL 0.7 0.6 0.5  Alkaline Phos 38 - 126 U/L 65 68 87  AST 15 - 41 U/L 27 27 35  ALT 0 - 44 U/L $Remo'11 13 13       'KIcpU$ Micro Results Recent Results (from the past 240 hour(s))  Urine Culture     Status: Abnormal   Collection Time: 11/12/20 12:20 PM   Specimen: Urine, Random  Result Value Ref Range Status   Specimen Description URINE, RANDOM  Final   Special Requests NONE  Final   Culture (A)  Final    <10,000 COLONIES/mL INSIGNIFICANT GROWTH Performed at [facility], [address], [place], [place] [address]    Report Status 11/13/2020 FINAL  Final  SARS CORONAVIRUS  2 (TAT 6-24 HRS) Nasopharyngeal Nasopharyngeal Swab     Status: Abnormal   Collection Time: 11/12/20  1:22 PM   Specimen: Nasopharyngeal Swab  Result Value Ref Range Status   SARS Coronavirus 2 POSITIVE (A) NEGATIVE Final    Comment:  (NOTE) SARS-CoV-2 target nucleic acids are DETECTED.  The SARS-CoV-2 RNA is generally detectable in upper and lower respiratory specimens during the acute phase of infection. Positive results are indicative of the presence of SARS-CoV-2 RNA. Clinical correlation with patient history and other diagnostic information is  necessary to determine patient infection status. Positive results do not rule out bacterial infection or co-infection with other viruses.  The expected result is Negative.  Fact Sheet for Patients: SugarRoll.be  Fact Sheet for Healthcare Providers: https://www.woods-mathews.com/  This test is not yet approved or cleared by the [place] FDA and  has been authorized for detection and/or diagnosis of SARS-CoV-2 by FDA under an Emergency Use Authorization (EUA). This EUA will remain  in effect (meaning this test can be used) for the duration of the COVID-19 declaration under Section 564(b)(1) of the Act, 21 U. S.C. section 360bbb-3(b)(1), unless the authorization is terminated or revoked sooner.   Performed at [facility], [place] [address] [place], [place] [address]     Radiology Reports DG Chest 2 View  Result Date: 11/12/2020 CLINICAL DATA:  Shortness of breath.  COVID exposure. EXAM: CHEST - 2 VIEW COMPARISON:  10/15/2020 FINDINGS: Cardiopericardial silhouette is at upper limits of normal for size. Chronic atelectasis or scarring noted right parahilar lung, stable since prior. Streaky bibasilar opacity likely atelectatic. No edema or focal airspace consolidation. No pleural effusion. The visualized bony structures of the thorax show no acute abnormality. IMPRESSION: 1. Chronic atelectasis or scarring in the right mid lung. 2. Streaky bibasilar opacity likely atelectatic. Electronically Signed   By: Misty Stanley M.D.   On: 11/12/2020 12:52   CT Head Wo Contrast  Result Date: 11/20/2020 CLINICAL DATA:  Syncopal  episode EXAM: CT HEAD WITHOUT CONTRAST TECHNIQUE: Contiguous axial images were obtained from the base of the skull through the vertex without intravenous contrast. COMPARISON:  July 02, 2020 FINDINGS: Brain: No evidence of acute infarction, hemorrhage, hydrocephalus, extra-axial collection or mass lesion/mass effect. Vascular: No hyperdense vessel or unexpected calcification. Skull: Normal. Negative for fracture or focal lesion. Sinuses/Orbits: No acute finding. Other: None. IMPRESSION: No acute intracranial abnormality. Electronically Signed   By: Valentino Saxon MD   On: 11/20/2020 14:16   CT Angio Chest PE W and/or Wo Contrast  Result Date: 11/20/2020 CLINICAL DATA:  Syncopal episode EXAM: CT ANGIOGRAPHY CHEST WITH CONTRAST TECHNIQUE: Multidetector CT imaging of the chest was performed using the standard protocol during bolus administration of intravenous contrast. Multiplanar CT image reconstructions and MIPs were obtained to evaluate the vascular anatomy. CONTRAST:  11mL OMNIPAQUE IOHEXOL 350 MG/ML SOLN COMPARISON:  None. FINDINGS: Cardiovascular: Nondiagnostic exam for pulmonary embolism. Patient is unable to follow instructions and artifact related to arm positioning as well as respiratory motion markedly degrade quality. No central pulmonary embolism. No pericardial effusion. Heart is enlarged. Aorta is normal in caliber. Mediastinum/Nodes: Visualized thyroid is unremarkable. No axillary or mediastinal adenopathy. Lungs/Pleura: No pleural effusion or pneumothorax. Bibasilar atelectasis. Scattered areas of peripheral ground-glass opacities (series 8, image 72). Upper Abdomen: No acute abnormality. Musculoskeletal: No acute osseous abnormality. Review of the MIP images confirms the above findings. IMPRESSION: 1. Nondiagnostic exam for pulmonary embolism due to patient inability to follow instructions/respiratory motion and artifact  related to arm positioning. No central pulmonary embolism. If  persistent concern for pulmonary embolism, recommend dedicated V/Q scan versus repeat scan when patient is able to follow instructions. 2. Scattered areas of peripheral ground-glass opacities. These are likely infectious or inflammatory in etiology. COVID-19 infection could present similarly. Electronically Signed   By: Valentino Saxon MD   On: 11/20/2020 14:25   DG Chest Port 1 View  Result Date: 11/20/2020 CLINICAL DATA:  Near syncopal episode.  Question sepsis. EXAM: PORTABLE CHEST 1 VIEW COMPARISON:  One-view chest x-ray 11/12/2020 FINDINGS: Heart is enlarged. Mild pulmonary vascular congestion is present. Lung volumes are low. No significant airspace consolidation is present. IMPRESSION: Cardiomegaly and mild pulmonary vascular congestion. Electronically Signed   By: San Morelle M.D.   On: 11/20/2020 11:23   DG Foot Complete Right  Result Date: 10/28/2020 Please see detailed radiograph report in office note.  NCV with EMG(electromyography)  Result Date: 11/10/2020 Alda Berthold, DO     11/10/2020  4:09 PM [facility] [address], [place]  [place], [place] [address] Tel: [phone] Fax:  [phone] Test Date:  11/10/2020 Patient: Emmerson Shuffield DOB: 07/15/1976 Physician: Narda Amber, DO Sex: Female Height: $RemoveBefore'5\' 4"'[id number]$  Ref Phys: Narda Amber, DO ID#: [id number]   Technician:  Patient Complaints: This is a 45 year old female with diabetic polyneuropathy referred for evaluation of progressive painful paresthesias. NCV & EMG Findings: Extensive electrodiagnostic testing of the left upper and lower extremity shows: 1. Left median sensory response shows prolonged latency (4.2 ms).  All sensory amplitudes in the upper extremity are reduced including the left median, ulnar, and radial nerves (L11.7, L6.5, L16.5 V). 2. Left sural and superficial peroneal sensory responses are absent. 3. Left median, ulnar, and peroneal (TA) motor responses are within normal limits.  Left peroneal  (EDB) and tibial motor responses show reduced amplitude. 4. Left tibial H reflex study is within normal limits. 5. Sparse chronic motor axonal loss changes are isolated to the left flexor digitorum longus muscles.  There is no evidence of accompanying active denervation in any of the tested muscles. Impression: 1. 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 2. A superimposed left carpal tunnel syndrome (moderate) was also likely, correlate clinically ___________________________ Narda Amber, DO Nerve Conduction Studies Anti Sensory Summary Table  Stim Site NR Peak (ms) Norm Peak (ms) P-T Amp (V) Norm P-T Amp Left Median Anti Sensory (2nd Digit)  33C Wrist    4.2 <3.4 11.7 >20 Left Radial Anti Sensory (Base 1st Digit)  33C Wrist    2.0 <2.7 16.5 >18 Left Sup Peroneal Anti Sensory (Ant Lat Mall)  33C 12 cm NR  <4.5  >5 Left Sural Anti Sensory (Lat Mall)  33C Calf NR  <4.5  >5 Left Ulnar Anti Sensory (5th Digit)  33C Wrist    3.1 <3.1 6.5 >12 Motor Summary Table  Stim Site NR Onset (ms) Norm Onset (ms) O-P Amp (mV) Norm O-P Amp Site1 Site2 Delta-0 (ms) Dist (cm) Vel (m/s) Norm Vel (m/s) Left Median Motor (Abd Poll Brev)  33C Wrist    3.6 <3.9 12.7 >6 Elbow Wrist 5.1 28.0 55 >50 Elbow    8.7  12.2        Left Peroneal Motor (Ext Dig Brev)  33C Ankle    3.5 <5.5 1.9 >3 B Fib Ankle 7.4 33.0 45 >40 B Fib    10.9  1.5  Poplt B Fib 1.8  8.0 44 >40 Poplt    12.7  1.3        Left Peroneal TA Motor (Tib Ant)  33C Fib Head    2.6 <4.0 5.3 >4 Poplit Fib Head 1.3 8.0 62 >40 Poplit    3.9  5.1        Left Tibial Motor (Abd Hall Brev)  33C Ankle NR  <6.0  >8 Knee Ankle  0.0  >40 Knee NR           Left Ulnar Motor (Abd Dig Minimi)  33C Wrist    2.7 <3.1 8.0 >7 B Elbow Wrist 3.6 20.0 56 >50 B Elbow    6.3  7.1  A Elbow B Elbow 1.8 10.0 56 >50 A Elbow    8.1  6.8        H Reflex Studies  NR H-Lat (ms) Lat Norm (ms) L-R H-Lat (ms) Left  Tibial (Gastroc)  33C    34.56 <35  EMG  Side Muscle Ins Act Fibs Psw Fasc Number Recrt Dur Dur. Amp Amp. Poly Poly. Comment Left AntTibialis Nml Nml Nml Nml Nml Nml Nml Nml Nml Nml Nml Nml N/A Left Gastroc Nml Nml Nml Nml Nml Nml Nml Nml Nml Nml Nml Nml N/A Left Flex Dig Long Nml Nml Nml Nml 1- Rapid Some 1+ Some 1+ Some 1+ N/A Left RectFemoris Nml Nml Nml Nml Nml Nml Nml Nml Nml Nml Nml Nml N/A Left GluteusMed Nml Nml Nml Nml Nml Nml Nml Nml Nml Nml Nml Nml N/A Left 1stDorInt Nml Nml Nml Nml Nml Nml Nml Nml Nml Nml Nml Nml N/A Left Abd Poll Brev Nml Nml Nml Nml Nml Nml Nml Nml Nml Nml Nml Nml N/A Left PronatorTeres Nml Nml Nml Nml Nml Nml Nml Nml Nml Nml Nml Nml N/A Left Biceps Nml Nml Nml Nml Nml Nml Nml Nml Nml Nml Nml Nml N/A Left Triceps Nml Nml Nml Nml Nml Nml Nml Nml Nml Nml Nml Nml N/A Left Deltoid Nml Nml Nml Nml Nml Nml Nml Nml Nml Nml Nml Nml N/A Waveforms:               SIGNED: Deatra James, MD, FHM. [facility],  Pager (please use [email or web address] to page/text) Please use Epic Secure Chat for non-urgent communication (7AM-7PM)  If 7PM-7AM, please contact night-coverage [email or web address], 11/21/2020, 9:55 AM

## 2020-11-21 NOTE — Plan of Care (Signed)
  Problem: Education: Goal: Knowledge of risk factors and measures for prevention of condition will improve Outcome: Progressing   Problem: Coping: Goal: Psychosocial and spiritual needs will be supported Outcome: Progressing   Problem: Respiratory: Goal: Will maintain a patent airway Outcome: Progressing Goal: Complications related to the disease process, condition or treatment will be avoided or minimized Outcome: Progressing   

## 2020-11-21 NOTE — Plan of Care (Signed)
  Problem: Education: Goal: Knowledge of risk factors and measures for prevention of condition will improve Outcome: Progressing   Problem: Education: Goal: Knowledge of General Education information will improve Description: Including pain rating scale, medication(s)/side effects and non-pharmacologic comfort measures Outcome: Progressing   Problem: Health Behavior/Discharge Planning: Goal: Ability to manage health-related needs will improve Outcome: Progressing   Problem: Pain Managment: Goal: General experience of comfort will improve Outcome: Progressing   Problem: Safety: Goal: Ability to remain free from injury will improve Outcome: Progressing

## 2020-11-22 DIAGNOSIS — G9341 Metabolic encephalopathy: Secondary | ICD-10-CM | POA: Diagnosis not present

## 2020-11-22 LAB — CBC WITH DIFFERENTIAL/PLATELET
Abs Immature Granulocytes: 0.05 10*3/uL (ref 0.00–0.07)
Basophils Absolute: 0.1 10*3/uL (ref 0.0–0.1)
Basophils Relative: 1 %
Eosinophils Absolute: 0.1 10*3/uL (ref 0.0–0.5)
Eosinophils Relative: 1 %
HCT: 29.6 % — ABNORMAL LOW (ref 36.0–46.0)
Hemoglobin: 8.7 g/dL — ABNORMAL LOW (ref 12.0–15.0)
Immature Granulocytes: 1 %
Lymphocytes Relative: 32 %
Lymphs Abs: 2.6 10*3/uL (ref 0.7–4.0)
MCH: 21.3 pg — ABNORMAL LOW (ref 26.0–34.0)
MCHC: 29.4 g/dL — ABNORMAL LOW (ref 30.0–36.0)
MCV: 72.5 fL — ABNORMAL LOW (ref 80.0–100.0)
Monocytes Absolute: 0.8 10*3/uL (ref 0.1–1.0)
Monocytes Relative: 10 %
Neutro Abs: 4.5 10*3/uL (ref 1.7–7.7)
Neutrophils Relative %: 55 %
Platelets: 232 10*3/uL (ref 150–400)
RBC: 4.08 MIL/uL (ref 3.87–5.11)
RDW: 23.5 % — ABNORMAL HIGH (ref 11.5–15.5)
WBC: 8.2 10*3/uL (ref 4.0–10.5)
nRBC: 0 % (ref 0.0–0.2)

## 2020-11-22 LAB — COMPREHENSIVE METABOLIC PANEL
ALT: 12 U/L (ref 0–44)
AST: 23 U/L (ref 15–41)
Albumin: 3 g/dL — ABNORMAL LOW (ref 3.5–5.0)
Alkaline Phosphatase: 66 U/L (ref 38–126)
Anion gap: 13 (ref 5–15)
BUN: 12 mg/dL (ref 6–20)
CO2: 24 mmol/L (ref 22–32)
Calcium: 8.4 mg/dL — ABNORMAL LOW (ref 8.9–10.3)
Chloride: 102 mmol/L (ref 98–111)
Creatinine, Ser: 0.97 mg/dL (ref 0.44–1.00)
GFR, Estimated: >60 mL/min (ref >60)
Glucose, Bld: 190 mg/dL — ABNORMAL HIGH (ref 70–99)
Potassium: 3.4 mmol/L — ABNORMAL LOW (ref 3.5–5.1)
Sodium: 139 mmol/L (ref 135–145)
Total Bilirubin: 0.7 mg/dL (ref 0.3–1.2)
Total Protein: 6.2 g/dL — ABNORMAL LOW (ref 6.5–8.1)

## 2020-11-22 LAB — GLUCOSE, CAPILLARY
Glucose-Capillary: 278 mg/dL — ABNORMAL HIGH (ref 70–99)
Glucose-Capillary: 226 mg/dL — ABNORMAL HIGH (ref 70–99)

## 2020-11-22 LAB — D-DIMER, QUANTITATIVE: D-Dimer, Quant: 0.36 ug/mL-FEU (ref 0.00–0.50)

## 2020-11-22 LAB — URINE CULTURE

## 2020-11-22 LAB — MAGNESIUM: Magnesium: 1.4 mg/dL — ABNORMAL LOW (ref 1.7–2.4)

## 2020-11-22 LAB — FERRITIN: Ferritin: 30 ng/mL (ref 11–307)

## 2020-11-22 LAB — PHOSPHORUS: Phosphorus: 3.7 mg/dL (ref 2.5–4.6)

## 2020-11-22 LAB — C-REACTIVE PROTEIN: CRP: 0.8 mg/dL (ref <1.0)

## 2020-11-22 MED ORDER — PANTOPRAZOLE SODIUM 40 MG PO TBEC
40.0000 mg | DELAYED_RELEASE_TABLET | Freq: Every day | ORAL | Status: DC
  Administered 2020-11-22 – 2020-11-23 (×2): 40 mg via ORAL
  Filled 2020-11-22 (×2): qty 1

## 2020-11-22 MED ORDER — ACETAMINOPHEN 325 MG PO TABS
650.0000 mg | ORAL_TABLET | Freq: Four times a day (QID) | ORAL | Status: DC | PRN
  Administered 2020-11-22: 650 mg via ORAL
  Filled 2020-11-22: qty 2

## 2020-11-22 MED ORDER — POTASSIUM CHLORIDE CRYS ER 20 MEQ PO TBCR
40.0000 meq | EXTENDED_RELEASE_TABLET | Freq: Once | ORAL | Status: AC
  Administered 2020-11-22: 40 meq via ORAL
  Filled 2020-11-22: qty 2

## 2020-11-22 MED ORDER — UMECLIDINIUM BROMIDE 62.5 MCG/INH IN AEPB
1.0000 | INHALATION_SPRAY | Freq: Every day | RESPIRATORY_TRACT | Status: DC
  Administered 2020-11-23: 1 via RESPIRATORY_TRACT

## 2020-11-22 MED ORDER — FLUTICASONE FUROATE-VILANTEROL 200-25 MCG/INH IN AEPB
1.0000 | INHALATION_SPRAY | Freq: Every day | RESPIRATORY_TRACT | Status: DC
  Filled 2020-11-22: qty 28

## 2020-11-22 MED ORDER — MAGNESIUM SULFATE 4 GM/100ML IV SOLN
4.0000 g | Freq: Once | INTRAVENOUS | Status: AC
  Administered 2020-11-22: 4 g via INTRAVENOUS
  Filled 2020-11-22: qty 100

## 2020-11-22 MED ORDER — ROPINIROLE HCL 0.25 MG PO TABS
0.2500 mg | ORAL_TABLET | Freq: Three times a day (TID) | ORAL | Status: DC
  Administered 2020-11-22 – 2020-11-23 (×3): 0.25 mg via ORAL
  Filled 2020-11-22 (×4): qty 1

## 2020-11-22 MED ORDER — TIOTROPIUM BROMIDE MONOHYDRATE 2.5 MCG/ACT IN AERS: 2.0000 | INHALATION_SPRAY | Freq: Every day | RESPIRATORY_TRACT | Status: DC

## 2020-11-22 MED ORDER — AMITRIPTYLINE HCL 25 MG PO TABS
75.0000 mg | ORAL_TABLET | Freq: Every day | ORAL | Status: DC
Start: 2020-11-22 — End: 2020-11-23
  Administered 2020-11-22: 75 mg via ORAL
  Filled 2020-11-22: qty 3

## 2020-11-22 MED ORDER — FLUTICASONE FUROATE-VILANTEROL 200-25 MCG/INH IN AEPB
1.0000 | INHALATION_SPRAY | Freq: Every day | RESPIRATORY_TRACT | Status: DC
  Administered 2020-11-23: 1 via RESPIRATORY_TRACT

## 2020-11-22 MED ORDER — ALBUTEROL SULFATE HFA 108 (90 BASE) MCG/ACT IN AERS: 2.0000 | INHALATION_SPRAY | Freq: Four times a day (QID) | RESPIRATORY_TRACT | Status: DC | PRN

## 2020-11-22 MED ORDER — UMECLIDINIUM BROMIDE 62.5 MCG/INH IN AEPB
1.0000 | INHALATION_SPRAY | Freq: Every day | RESPIRATORY_TRACT | Status: DC
  Filled 2020-11-22: qty 7

## 2020-11-22 NOTE — Progress Notes (Signed)
PROGRESS NOTE  Jill Shaw  DOB: 1976-01-25  PCP: Vevelyn Francois, NP YQM:[id number]  DOA: 11/20/2020  LOS: 2 days   Chief Complaint  Patient presents with  . Near Syncope   Brief narrative: Jill Shaw is a 45 y.o. female with PMH significant for morbid obesity, OSA, DM2, HTN. Patient was brought to the ED on 2/5 after she was found on floor.  She was helped to get up with assistance but seemed altered and sluggish on all responses and hence brought to the ED.  In the ED, she was altered, tachycardic, hypertensive.  Oxygen saturation 100% on room air. Labs showed lactic acid level of 2.1 Blood gas did not show CO2 retention, ammonia level was only mildly elevated. Urine drug screen was negative. Covid PCR positive on admission CT scan of head was normal. CTA chest did not show pulmonary embolism or any infiltrates. Patient was admitted to hospitalist service for further evaluation management  Subjective: Patient was seen and examined this morning. Middle-aged African-American female.  Looks older for her age.  Not in distress.  Mental status improving.  Feels better.  Assessment/Plan: Acute metabolic encephalopathy Hypertensive encephalopathy -At presentation, patient was tachycardic, blood pressure was elevated as well. -Patient is states to me today that when her blood pressure rises close to 200, patient is confused, disoriented.  She felt similar symptoms this admission. -No other explanation of altered mental status. -Mental status is gradually down towards normal now. -Continue to monitor.  Hypertensive urgency  -Patient blood pressure was elevated on admission and continues to remain elevated up to 160s this morning.   -Patient is currently on continued on home meds that include losartan 50 mg daily, Lasix 40 mg twice daily and Aldactone 50 mg daily.  She is also on labetalol 200 mg twice daily.  Apparently she was on metoprolol succinate 200 mg daily at home.   I will switch her from labetalol to metoprolol at her home dose.  COVID 19 infection  -No hypoxia, chest x-ray normal. -3 days of IV remdesivir to complete today.  Not on steroids. -Continue to monitor.  SIRS -Patient met SIRS creatinine at admission with tachycardia, tachypnea, leukocytosis, lactic acid level elevation and mental status change. -However, no evidence of any infectious source. -Procalcitonin less than 0.1.  Not on antibiotics. -Parameters improved. Recent Labs  Lab 11/20/20 1034 11/20/20 1038 11/20/20 1433 11/22/20 0343  WBC 10.8*  --   --  8.2  LATICACIDVEN  --  2.8* 2.1*  --   PROCALCITON <0.10  --   --   --    Blood culture positive -Blood culture obtained on admission grew Streptococcus para sanguinous in anaerobic bottle only. -True infection versus contamination. -Repeat blood culture today. -Currently not on antibiotics.  WBC count normal.  No fever.  Monitor off antibiotics.  BMW4X -Remains at baseline.  Continue to monitor  Recent Labs    10/15/20 2029 10/16/20 0346 10/16/20 0730 10/16/20 1148 10/16/20 1536 10/17/20 0405 11/12/20 1332 11/20/20 1034 11/21/20 0854 11/22/20 0343  BUN $Re'20 18 16 15 15 14 10 15 14 12  'Pfd$ CREATININE 1.95* 1.77* 1.62* 1.56* 1.52* 1.46* 1.24* 1.25* 1.12* 0.97   Diabetes mellitus type 2 -A1c 9.5 on 10/16/2020 -Home meds include Humalog 75/25 pen-110 units twice daily, Onglyza 2.5 mg daily, Recent Labs  Lab 11/21/20 0201 11/21/20 0757 11/21/20 1138 11/21/20 1652 11/21/20 2121  GLUCAP 167* 129* 116* 172* 164*   Anxiety/depression Chronic pain Restless leg syndrome -Home meds include  multiple mood altering medications including Elavil 75 mg daily, bupropion 150 mg twice daily, Dilaudid 2 mg every 8 hours as needed for severe pain, Zanaflex 4 mg 3 times daily and Requip 0.25 mg 3 times daily. -Resume gradually.  There is a chance that her altered mental status representation was related to polypharmacy.  Minimize  the use of mood altering medications.  Hyperlipidemia -Continue Crestor.  COPD -Continue Symbicort, Spiriva,, Flonase  Chronic anemia Chronic iron deficiency -Hemoglobin at baseline between 9-10.  Ferritin level low at 8. -Continue iron supplement. -Continue Prilosec, Carafate Recent Labs    10/16/20 0346 10/17/20 0405 11/12/20 1332 11/20/20 1034 11/20/20 1312 11/21/20 0854 11/22/20 0343  HGB 9.2* 9.0* 10.8* 9.9*  --   --  8.7*  MCV 70.2* 72.9* 68.9* 72.7*  --   --  72.5*  FERRITIN  --   --   --   --  8* 10* 30   Morbid obesity - Body mass index is 44.46 kg/m. Patient has been advised to make an attempt to improve diet and exercise patterns to aid in weight loss.  OSA -Patient states she was scheduled for sleep study as an outpatient but could not go because of Covid.  Needs to reschedule  Mobility: Encourage ambulation.  PT eval obtained Code Status:   Code Status: Full Code  Nutritional status: Body mass index is 44.46 kg/m.     Diet Order            Diet Carb Modified Fluid consistency: Thin; Room service appropriate? Yes  Diet effective now                DVT prophylaxis: Lovenox subcu   Antimicrobials:  None Fluid: None Consultants: None Family Communication:  Not at bedside  Status is: Inpatient  Remains inpatient appropriate because: Blood culture positive.  Repeat sent   Dispo: The patient is from: Home              Anticipated d/c is to: Home, pending PT eval              Anticipated d/c date is: 2 days              Patient currently is not medically stable to d/c.   Difficult to place patient No       Infusions:    Scheduled Meds: . vitamin C  500 mg Oral Daily  . buPROPion  150 mg Oral BID  . enoxaparin (LOVENOX) injection  60 mg Subcutaneous Q24H  . ferrous sulfate  325 mg Oral Q breakfast  . fluticasone  2 spray Each Nare Daily  . furosemide  40 mg Oral BID  . insulin aspart  0-15 Units Subcutaneous TID WC  . insulin  aspart protamine- aspart  50 Units Subcutaneous BID WC  . labetalol  200 mg Oral BID  . loratadine  10 mg Oral Daily  . losartan  50 mg Oral Daily  . nicotine  21 mg Transdermal Daily  . rosuvastatin  5 mg Oral Daily  . spironolactone  50 mg Oral Daily  . sucralfate  1 g Oral TID WC & HS  . zinc sulfate  220 mg Oral Daily    Antimicrobials: Anti-infectives (From admission, onward)   Start     Dose/Rate Route Frequency Ordered Stop   11/21/20 1000  remdesivir 100 mg in sodium chloride 0.9 % 100 mL IVPB       "Followed by" Linked Group Details   100  mg 200 mL/hr over 30 Minutes Intravenous Daily 11/20/20 1806 11/22/20 0935   11/20/20 1830  remdesivir 200 mg in sodium chloride 0.9% 250 mL IVPB       "Followed by" Linked Group Details   200 mg 580 mL/hr over 30 Minutes Intravenous Once 11/20/20 1806 11/20/20 2310   11/20/20 1245  ceFEPIme (MAXIPIME) 2 g in sodium chloride 0.9 % 100 mL IVPB        2 g 200 mL/hr over 30 Minutes Intravenous  Once 11/20/20 1244 11/20/20 1429   11/20/20 1245  metroNIDAZOLE (FLAGYL) IVPB 500 mg        500 mg 100 mL/hr over 60 Minutes Intravenous  Once 11/20/20 1244 11/20/20 1445   11/20/20 1245  vancomycin (VANCOCIN) IVPB 1000 mg/200 mL premix        1,000 mg 200 mL/hr over 60 Minutes Intravenous  Once 11/20/20 1244 11/20/20 2310      PRN meds: guaiFENesin-dextromethorphan, hydrALAZINE, HYDROmorphone, metoprolol tartrate, ondansetron **OR** ondansetron (ZOFRAN) IV, tiZANidine   Objective: Vitals:   11/22/20 0901 11/22/20 1331  BP: 129/60 (!) 159/73  Pulse: 92 89  Resp:  (!) 22  Temp:  98.6 F (37 C)  SpO2:  96%    Intake/Output Summary (Last 24 hours) at 11/22/2020 1428 Last data filed at 11/22/2020 0905 Gross per 24 hour  Intake 710 ml  Output 1150 ml  Net -440 ml   Filed Weights   11/21/20 0242  Weight: 117.5 kg   Weight change:  Body mass index is 44.46 kg/m.   Physical Exam: General exam: Morbidly obese, middle-aged  African-American female. Skin: No rashes, lesions or ulcers. HEENT: Atraumatic, normocephalic, no obvious bleeding Lungs: Clear to auscultation bilaterally CVS: Regular rate and rhythm, no murmur GI/Abd soft, nontender, nondistended, bowel sound present CNS: Alert, awake, oriented x3 Psychiatry: Mood appropriate Extremities: Trace bilateral pedal edema.  Data Review: I have personally reviewed the laboratory data and studies available.  Recent Labs  Lab 11/20/20 1034 11/22/20 0343  WBC 10.8* 8.2  NEUTROABS 8.3* 4.5  HGB 9.9* 8.7*  HCT 33.0* 29.6*  MCV 72.7* 72.5*  PLT 236 232   Recent Labs  Lab 11/20/20 1034 11/21/20 0854 11/22/20 0343  NA 143 141 139  K 4.1 3.5 3.4*  CL 107 106 102  CO2 $Re'24 23 24  'xyy$ GLUCOSE 185* 117* 190*  BUN $Re'15 14 12  'kNE$ CREATININE 1.25* 1.12* 0.97  CALCIUM 9.1 8.6* 8.4*  MG  --  1.5* 1.4*  PHOS  --  2.7 3.7    F/u labs ordered  Signed, [name], MD [facility] 11/22/2020

## 2020-11-22 NOTE — Evaluation (Signed)
Physical Therapy Evaluation Patient Details Name: Jill Shaw MRN: [id number] DOB: 08-17-1976 Today's Date: 11/22/2020   History of Present Illness  45 y.o. female with medical history significant of HTN, DM2, obesity. Patient is a poor historian. Hx from son. He reports that yesterday his mom was in a depressed mood d/t illness of another family member.  Family member found her on the floor. It appeared as if she fell out of the bed. She was very slow to get up with assistance and she seem to be sluggish in all her responses.  Pt admitted for acute metabolic encephalopathy and COVID 19 infection -without hypoxia or respiratory failure  Clinical Impression  Patient evaluated by Physical Therapy with no further acute PT needs identified. All education has been completed and the patient has no further questions.  Pt ambulated within room and reports dyspnea at baseline.  Pt reports bad "knee caps" and requesting a brace her pain med clinic doctor recommended so requested she follow up with this doctor or PCP.  Pt requesting w/c for community however encouraged pt to maintain mobility with movement and recommended rollator (4 wheeled walker with seat) due to her dyspnea with activity and need for seated rest breaks when going to grocery store.  Pt reports she had physical therapy in the past however they dismissed her after hospital stays. See below for any follow-up Physical Therapy or equipment needs. PT is signing off. Thank you for this referral.     Follow Up Recommendations Outpatient PT    Equipment Recommendations  Other (comment) (rollator)    Recommendations for Other Services       Precautions / Restrictions Precautions Precautions: None      Mobility  Bed Mobility Overal bed mobility: Modified Independent                  Transfers Overall transfer level: Modified independent                  Ambulation/Gait Ambulation/Gait assistance: Supervision Gait  Distance (Feet): 80 Feet Assistive device: None Gait Pattern/deviations: Step-through pattern;Decreased stride length;Wide base of support     General Gait Details: no unsteadiness, pt reports dyspnea however states this is her baseline, SPO2 100% room air  Stairs            Wheelchair Mobility    Modified Rankin (Stroke Patients Only)       Balance Overall balance assessment: History of Falls (reports falls due to knees giving out)                                           Pertinent Vitals/Pain Pain Assessment: No/denies pain    Home Living Family/patient expects to be discharged to:: Private residence Living Arrangements: Children;Parent Available Help at Discharge: Family;Available 24 hours/day Type of Home: Apartment Home Access: Stairs to enter Entrance Stairs-Rails: Right Entrance Stairs-Number of Steps: flight Home Layout: One level Home Equipment: Walker - 2 wheels;Cane - single point      Prior Function Level of Independence: Independent               Hand Dominance        Extremity/Trunk Assessment        Lower Extremity Assessment Lower Extremity Assessment: Overall WFL for tasks assessed    Cervical / Trunk Assessment Cervical / Trunk Assessment: Normal  Communication  Communication: No difficulties  Cognition Arousal/Alertness: Awake/alert Behavior During Therapy: WFL for tasks assessed/performed Overall Cognitive Status: Within Functional Limits for tasks assessed                                        General Comments      Exercises     Assessment/Plan    PT Assessment All further PT needs can be met in the next venue of care  PT Problem List Decreased strength;Decreased mobility;Decreased activity tolerance;Decreased knowledge of use of DME;Cardiopulmonary status limiting activity       PT Treatment Interventions      PT Goals (Current goals can be found in the Care Plan section)   Acute Rehab PT Goals PT Goal Formulation: All assessment and education complete, DC therapy    Frequency     Barriers to discharge        Co-evaluation               AM-PAC PT "6 Clicks" Mobility  Outcome Measure Help needed turning from your back to your side while in a flat bed without using bedrails?: None Help needed moving from lying on your back to sitting on the side of a flat bed without using bedrails?: None Help needed moving to and from a bed to a chair (including a wheelchair)?: None Help needed standing up from a chair using your arms (e.g., wheelchair or bedside chair)?: None Help needed to walk in hospital room?: A Little Help needed climbing 3-5 steps with a railing? : A Little 6 Click Score: 22    End of Session   Activity Tolerance: Patient tolerated treatment well Patient left: in bed;with call bell/phone within reach;with bed alarm set Nurse Communication: Mobility status PT Visit Diagnosis: History of falling (Z91.81);Difficulty in walking, not elsewhere classified (R26.2)    Time: 1595-3967 PT Time Calculation (min) (ACUTE ONLY): 17 min   Charges:   PT Evaluation $PT Eval Low Complexity: 1 Low        Kati PT, DPT Acute Rehabilitation Services Pager: [phone] Office: [phone]   York Ram E 11/22/2020, 12:04 PM

## 2020-11-23 DIAGNOSIS — G9341 Metabolic encephalopathy: Secondary | ICD-10-CM | POA: Diagnosis not present

## 2020-11-23 LAB — COMPREHENSIVE METABOLIC PANEL
ALT: 13 U/L (ref 0–44)
AST: 26 U/L (ref 15–41)
Albumin: 3.1 g/dL — ABNORMAL LOW (ref 3.5–5.0)
Alkaline Phosphatase: 69 U/L (ref 38–126)
Anion gap: 13 (ref 5–15)
BUN: 12 mg/dL (ref 6–20)
CO2: 21 mmol/L — ABNORMAL LOW (ref 22–32)
Calcium: 8.7 mg/dL — ABNORMAL LOW (ref 8.9–10.3)
Chloride: 105 mmol/L (ref 98–111)
Creatinine, Ser: 1.01 mg/dL — ABNORMAL HIGH (ref 0.44–1.00)
GFR, Estimated: >60 mL/min (ref >60)
Glucose, Bld: 153 mg/dL — ABNORMAL HIGH (ref 70–99)
Potassium: 3.8 mmol/L (ref 3.5–5.1)
Sodium: 139 mmol/L (ref 135–145)
Total Bilirubin: 0.6 mg/dL (ref 0.3–1.2)
Total Protein: 6.2 g/dL — ABNORMAL LOW (ref 6.5–8.1)

## 2020-11-23 LAB — CBC WITH DIFFERENTIAL/PLATELET
Abs Immature Granulocytes: 0.07 10*3/uL (ref 0.00–0.07)
Basophils Absolute: 0 10*3/uL (ref 0.0–0.1)
Basophils Relative: 1 %
Eosinophils Absolute: 0.1 10*3/uL (ref 0.0–0.5)
Eosinophils Relative: 1 %
HCT: 29.2 % — ABNORMAL LOW (ref 36.0–46.0)
Hemoglobin: 8.6 g/dL — ABNORMAL LOW (ref 12.0–15.0)
Immature Granulocytes: 1 %
Lymphocytes Relative: 31 %
Lymphs Abs: 2.4 10*3/uL (ref 0.7–4.0)
MCH: 21.7 pg — ABNORMAL LOW (ref 26.0–34.0)
MCHC: 29.5 g/dL — ABNORMAL LOW (ref 30.0–36.0)
MCV: 73.7 fL — ABNORMAL LOW (ref 80.0–100.0)
Monocytes Absolute: 0.7 10*3/uL (ref 0.1–1.0)
Monocytes Relative: 9 %
Neutro Abs: 4.4 10*3/uL (ref 1.7–7.7)
Neutrophils Relative %: 57 %
Platelets: 183 10*3/uL (ref 150–400)
RBC: 3.96 MIL/uL (ref 3.87–5.11)
RDW: 22.6 % — ABNORMAL HIGH (ref 11.5–15.5)
WBC: 7.7 10*3/uL (ref 4.0–10.5)
nRBC: 0.6 % — ABNORMAL HIGH (ref 0.0–0.2)

## 2020-11-23 LAB — C-REACTIVE PROTEIN: CRP: 1 mg/dL — ABNORMAL HIGH (ref <1.0)

## 2020-11-23 LAB — D-DIMER, QUANTITATIVE: D-Dimer, Quant: 0.36 ug/mL-FEU (ref 0.00–0.50)

## 2020-11-23 LAB — CULTURE, BLOOD (SINGLE): Special Requests: ADEQUATE

## 2020-11-23 LAB — GLUCOSE, CAPILLARY
Glucose-Capillary: 261 mg/dL — ABNORMAL HIGH (ref 70–99)
Glucose-Capillary: 310 mg/dL — ABNORMAL HIGH (ref 70–99)
Glucose-Capillary: 201 mg/dL — ABNORMAL HIGH (ref 70–99)

## 2020-11-23 LAB — MAGNESIUM: Magnesium: 1.8 mg/dL (ref 1.7–2.4)

## 2020-11-23 LAB — PHOSPHORUS: Phosphorus: 4 mg/dL (ref 2.5–4.6)

## 2020-11-23 LAB — FERRITIN: Ferritin: 42 ng/mL (ref 11–307)

## 2020-11-23 MED ORDER — METOPROLOL SUCCINATE ER 100 MG PO TB24
200.0000 mg | ORAL_TABLET | Freq: Every day | ORAL | Status: DC
  Administered 2020-11-23: 200 mg via ORAL
  Filled 2020-11-23: qty 2

## 2020-11-23 MED ORDER — BUPROPION HCL ER (SR) 150 MG PO TB12: 150.0000 mg | ORAL_TABLET | Freq: Two times a day (BID) | ORAL | 11 refills | Status: DC

## 2020-11-23 MED ORDER — GUAIFENESIN-DM 100-10 MG/5ML PO SYRP: 10.0000 mL | ORAL_SOLUTION | ORAL | 0 refills | Status: DC | PRN

## 2020-11-23 NOTE — Discharge Summary (Signed)
Physician Discharge Summary  Jill Shaw DOB: 01-Jul-1976 DOA: 11/20/2020  PCP: Vevelyn Francois, NP  Admit date: 11/20/2020 Discharge date: 11/23/2020  Admitted From: Home Discharge disposition: Home with outpatient PT   Code Status: Full Code  Diet Recommendation: Cardiac/diabetic diet  Discharge Diagnosis:   Active Problems:   Acute metabolic encephalopathy  Chief Complaint  Patient presents with  . Near Syncope   Brief narrative: Jill Shaw is a 45 y.o. female with PMH significant for morbid obesity, OSA, DM2, HTN. Patient was brought to the ED on 2/5 after she was found on floor.  She was helped to get up with assistance but seemed altered and sluggish on all responses and hence brought to the ED.  In the ED, she was altered, tachycardic, hypertensive.  Oxygen saturation 100% on room air. Labs showed lactic acid level of 2.1 Blood gas did not show CO2 retention, ammonia level was only mildly elevated. Urine drug screen was negative. Covid PCR positive on admission CT scan of head was normal. CTA chest did not show pulmonary embolism or any infiltrates. Patient was admitted to hospitalist service for further evaluation management. See details below.  Subjective: Patient was seen and examined this morning. Middle-aged African-American female.  Looks older for her age.  Not in distress.  Mental status improving.  Feels better.  Hospital course: Acute metabolic encephalopathy Hypertensive encephalopathy -At presentation, patient was tachycardic, blood pressure was elevated as well. -Patient states that when her blood pressure rises close to 200, she becomes confused, disoriented.  She felt similar symptoms this admission. -She is taking multiple mood altering medication as well as Dilaudid for pain.  She is at risk of polypharmacy and it could have been a component of her sedation at arrival. -Mental status is back to normal now.  Hypertensive  urgency  -Patient blood pressure was elevated on admission and continues to remain elevated up to 160s this morning.   -Patient is currently on continued on home meds that include metoprolol 200 mg daily, losartan 50 mg daily, Lasix 40 mg twice daily and Aldactone 50 mg daily.   -Continue the same.  Encourage compliance.  COVID 19 infection  -No hypoxia, chest x-ray normal. -3 days of IV remdesivir to completed.  Not on steroids. -Continue to monitor.  SIRS -Patient met SIRS creatinine at admission with tachycardia, tachypnea, leukocytosis, lactic acid level elevation and mental status change. -However, no evidence of any infectious source. -Procalcitonin less than 0.1.  Not on antibiotics. -Parameters improved. Recent Labs  Lab 11/20/20 1034 11/20/20 1038 11/20/20 1433 11/22/20 0343 11/23/20 0433  WBC 10.8*  --   --  8.2 7.7  LATICACIDVEN  --  2.8* 2.1*  --   --   PROCALCITON <0.10  --   --   --   --    Blood culture positive -Blood culture obtained on admission grew Streptococcus parasanguinous in anaerobic bottle only. -There is most likely contaminant.  Patient was not given any antibiotics.  She did not have any fever or WBC count.  She is clinically stable without antibiotics.  Repeat blood culture sent on 2/7 did not show any growth either.    [id number] -Remains at baseline.  Continue to monitor  Recent Labs    10/16/20 0346 10/16/20 0730 10/16/20 1148 10/16/20 1536 10/17/20 0405 11/12/20 1332 11/20/20 1034 11/21/20 0854 11/22/20 0343 11/23/20 0433  BUN _0 CREATININE 1.77* 1.62* 1.56* 1.52*  1.46* 1.24* 1.25* 1.12* 0.97 1.01*   Diabetes mellitus type 2 -A1c 9.5 on 10/16/2020 -Home meds include Humalog 75/25 pen-110 units twice daily, Onglyza 2.5 mg daily. -Resume the same at home.  Encourage compliance. Recent Labs  Lab 11/21/20 1652 11/21/20 2121 11/22/20 1622 11/22/20 2155 11/23/20 0806  GLUCAP 172* 164* 278* 226* 201*    Anxiety/depression Chronic pain Restless leg syndrome -Home meds include multiple mood altering medications including Elavil 75 mg daily, bupropion 150 mg twice daily, Dilaudid 2 mg every 8 hours as needed for severe pain, Zanaflex 4 mg 3 times daily and Requip 0.25 mg 3 times daily. -Had a long conversation with patient this morning about the risk of polypharmacy.  She states he wants to cut down medicine.  We will reduce the dose of these at discharge.  He needs to continue to follow-up with PCP as an outpatient for further adjustment.  Hyperlipidemia -Continue Crestor.  COPD -Continue Symbicort, Spiriva,, Flonase  Chronic anemia Chronic iron deficiency -Hemoglobin at baseline between 9-10.  Ferritin level low at 8. -Continue iron supplement. -Continue Prilosec, Carafate Recent Labs    10/17/20 0405 11/12/20 1332 11/20/20 1034 11/20/20 1312 11/21/20 0854 11/22/20 0343 11/23/20 0433  HGB 9.0* 10.8* 9.9*  --   --  8.7* 8.6*  MCV 72.9* 68.9* 72.7*  --   --  72.5* 73.7*  FERRITIN  --   --   --  8* 10* 30 42   Morbid obesity - Body mass index is 44.46 kg/m. Patient has been advised to make an attempt to improve diet and exercise patterns to aid in weight loss.  OSA -Patient states she was scheduled for sleep study as an outpatient but could not go because of Covid.  Needs to reschedule  Stable for discharge to home today with outpatient PT.  Wound care: Incision (Closed) 08/20/19 Vagina Other (Comment) (Active)  Date First Assessed/Time First Assessed: 08/20/19 1703   Location: Vagina  Location Orientation: Other (Comment)    Assessments 08/20/2019  5:20 PM 08/20/2019  6:50 PM  Dressing Type Peripads Peripads  Dressing Intact;New drainage Intact;New drainage  Drainage Amount Minimal Minimal  Drainage Description Sanguineous Sanguineous     No Linked orders to display    Discharge Exam:   Vitals:   11/22/20 1331 11/22/20 2143 11/22/20 2144 11/23/20 0632  BP: (!)  159/73 140/83 (!) 149/77 (!) 170/81  Pulse: 89 89 85 82  Resp: (!) _0 Temp: 98.6 F (37 C)  98.2 F (36.8 C) 99.9 F (37.7 C)  TempSrc: Oral  Oral Oral  SpO2: 96%  99%   Weight:      Height:        Body mass index is 44.46 kg/m.  General exam: Pleasant young morbidly obese African-American female.  Not in distress Skin: No rashes, lesions or ulcers. HEENT: Atraumatic, normocephalic, no obvious bleeding Lungs: Clear to auscultation bilaterally CVS: Regular rate and rhythm, no murmur GI/Abd soft, nontender, nondistended, bowel sound present CNS: Alert, awake, oriented x3 Psychiatry: Mood appropriate Extremities: No pedal edema, no calf tenderness  Follow ups:   Discharge Instructions    Diet - low sodium heart healthy   Complete by: As directed    Diet Carb Modified   Complete by: As directed    Increase activity slowly   Complete by: As directed       Follow-up Information    Vevelyn Francois, NP Follow up.   Specialty: Adult Health Nurse Practitioner Contact  information: [address] [place] [address] [phone]        Lorretta Harp, MD .   Specialties: Cardiology, Radiology Contact information: [address] [place] [address] [phone]               Recommendations for Outpatient Follow-Up:   1. Follow-up with PCP as an outpatient  Discharge Instructions:  Follow with Primary MD Vevelyn Francois, NP in 7 days   Get CBC/BMP checked in next visit within 1 week by PCP or SNF MD ( we routinely change or add medications that can affect your baseline labs and fluid status, therefore we recommend that you get the mentioned basic workup next visit with your PCP, your PCP may decide not to get them or add new tests based on their clinical decision)  On your next visit with your PCP, please Get Medicines reviewed and adjusted.  Please request your PCP  to go over all Hospital Tests and Procedure/Radiological  results at the follow up, please get all Hospital records sent to your Prim MD by signing hospital release before you go home.  Activity: As tolerated with Full fall precautions use walker/cane & assistance as needed  For Heart failure patients - Check your Weight same time everyday, if you gain over 2 pounds, or you develop in leg swelling, experience more shortness of breath or chest pain, call your Primary MD immediately. Follow Cardiac Low Salt Diet and 1.5 lit/day fluid restriction.  If you have smoked or chewed Tobacco in the last 2 yrs please stop smoking, stop any regular Alcohol  and or any Recreational drug use.  If you experience worsening of your admission symptoms, develop shortness of breath, life threatening emergency, suicidal or homicidal thoughts you must seek medical attention immediately by calling 911 or calling your MD immediately  if symptoms less severe.  You Must read complete instructions/literature along with all the possible adverse reactions/side effects for all the Medicines you take and that have been prescribed to you. Take any new Medicines after you have completely understood and accpet all the possible adverse reactions/side effects.   Do not drive, operate heavy machinery, perform activities at heights, swimming or participation in water activities or provide baby sitting services if your were admitted for syncope or siezures until you have seen by Primary MD or a Neurologist and advised to do so again.  Do not drive when taking Pain medications.  Do not take more than prescribed Pain, Sleep and Anxiety Medications  Wear Seat belts while driving.   Please note You were cared for by a hospitalist during your hospital stay. If you have any questions about your discharge medications or the care you received while you were in the hospital after you are discharged, you can call the unit and asked to speak with the hospitalist on call if the hospitalist that took  care of you is not available. Once you are discharged, your primary care physician will handle any further medical issues. Please note that NO REFILLS for any discharge medications will be authorized once you are discharged, as it is imperative that you return to your primary care physician (or establish a relationship with a primary care physician if you do not have one) for your aftercare needs so that they can reassess your need for medications and monitor your lab values.    Allergies as of 11/23/2020      Reactions   Ketoprofen Nausea And Vomiting  Aspirin Nausea Only   Gabapentin Nausea And Vomiting, Other (See Comments)   upset stomach   Ibuprofen Nausea And Vomiting   Liraglutide Nausea And Vomiting   Naproxen Nausea And Vomiting   Omeprazole-sodium Bicarbonate Nausea And Vomiting   Sulfa Antibiotics Nausea And Vomiting   Tramadol Nausea And Vomiting, Other (See Comments)   stomach upset      Medication List    STOP taking these medications   amitriptyline 75 MG tablet Commonly known as: ELAVIL   cetirizine 10 MG tablet Commonly known as: ZYRTEC   fluconazole 150 MG tablet Commonly known as: Diflucan   metoCLOPramide 10 MG tablet Commonly known as: REGLAN   promethazine 12.5 MG tablet Commonly known as: PHENERGAN   tiZANidine 4 MG tablet Commonly known as: ZANAFLEX     TAKE these medications   Accu-Chek Guide test strip Generic drug: glucose blood USE AS DIRECTED UP TO FOUR TIMES DAILY   albuterol (2.5 MG/3ML) 0.083% nebulizer solution Commonly known as: PROVENTIL Take 3 mLs (2.5 mg total) by nebulization every 6 (six) hours as needed for wheezing or shortness of breath.   albuterol 108 (90 Base) MCG/ACT inhaler Commonly known as: VENTOLIN HFA Inhale 2 puffs into the lungs every 6 (six) hours as needed for wheezing or shortness of breath.   blood glucose meter kit and supplies Kit 1 each by Other route See admin instructions. Dispense based on patient and  insurance preference. Use up to four times daily as directed. (FOR ICD-9 250.00, 250.01).   budesonide-formoterol 160-4.5 MCG/ACT inhaler Commonly known as: Symbicort INHALE 2 PUFFS INTO THE LUNGS 2 (TWO) TIMES DAILY. What changed:   how much to take  how to take this  when to take this  additional instructions   buPROPion 150 MG 12 hr tablet Commonly known as: Wellbutrin SR Take 1 tablet (150 mg total) by mouth 2 (two) times daily.   diclofenac Sodium 1 % Gel Commonly known as: VOLTAREN Apply 4 g topically 4 (four) times daily as needed (pain).   ferrous sulfate 325 (65 FE) MG tablet Take 1 tablet (325 mg total) by mouth 3 (three) times daily with meals. What changed: when to take this   fluticasone 50 MCG/ACT nasal spray Commonly known as: FLONASE Place 2 sprays into both nostrils daily.   FreeStyle Libre 14 Day Reader Kerrin Mo 1 application by Does not apply route every 14 (fourteen) days.   furosemide 40 MG tablet Commonly known as: LASIX Take 1 tablet (40 mg total) by mouth 2 (two) times daily.   Glucosamine Sulfate 1000 MG Caps Take 1 capsule (1,000 mg total) by mouth 2 (two) times daily.   guaiFENesin-dextromethorphan 100-10 MG/5ML syrup Commonly known as: ROBITUSSIN DM Take 10 mLs by mouth every 4 (four) hours as needed for cough.   hydrocortisone 2.5 % cream Apply topically 2 (two) times daily. What changed: how much to take   HYDROmorphone 2 MG tablet Commonly known as: DILAUDID Take 1 tablet (2 mg total) by mouth every 8 (eight) hours as needed for severe pain.   hydroquinone 4 % cream APPLY TOPICALLY TWICE DAILY What changed: how much to take   Insulin Lispro Prot & Lispro (75-25) 100 UNIT/ML Kwikpen Commonly known as: HumaLOG Mix 75/25 KwikPen Inject 120 Units into the skin 2 (two) times daily. INJECT 110 UNITS EVERY 12 HOURS What changed:   how much to take  additional instructions   lidocaine 5 % ointment Commonly known as:  XYLOCAINE Apply 1 application topically  as needed. What changed: reasons to take this   losartan 50 MG tablet Commonly known as: COZAAR TAKE 1 TABLET(50MG TOTAL) BY MOUTH EVERY DAY What changed: See the new instructions.   metoprolol 200 MG 24 hr tablet Commonly known as: TOPROL-XL Take 1 tablet (200 mg total) by mouth daily. Take with or immediately following a meal. What changed: additional instructions   nicotine 21 mg/24hr patch Commonly known as: Nicoderm CQ Place 1 patch (21 mg total) onto the skin daily.   norethindrone 5 MG tablet Commonly known as: Aygestin Take 2 tablets (10 mg total) by mouth in the morning, at noon, in the evening, and at bedtime. With bleeding   omeprazole 40 MG capsule Commonly known as: PRILOSEC TAKE 1 CAPSULE(40MG TOTAL) BY MOUTH EVERY DAY What changed: See the new instructions.   Onglyza 2.5 MG Tabs tablet Generic drug: saxagliptin HCl Take 1 tablet (2.5 mg total) by mouth daily.   Pen Needles 30G X 5 MM Misc 1 Units by Does not apply route as directed.   potassium chloride SA 20 MEQ tablet Commonly known as: KLOR-CON Take 20 mEq by mouth daily.   rOPINIRole 0.25 MG tablet Commonly known as: Requip Take 1 tablet (0.25 mg total) by mouth 3 (three) times daily.   rosuvastatin 5 MG tablet Commonly known as: Crestor Take 1 tablet (5 mg total) by mouth daily.   Spiriva Respimat 2.5 MCG/ACT Aers Generic drug: Tiotropium Bromide Monohydrate Inhale 2 puffs into the lungs daily.   spironolactone 50 MG tablet Commonly known as: ALDACTONE Take 1 tablet (50 mg total) by mouth daily.   sucralfate 1 GM/10ML suspension Commonly known as: CARAFATE Take 10 mLs (1 g total) by mouth 4 (four) times daily -  with meals and at bedtime.   True Metrix Meter w/Device Kit 1 each by Does not apply route 4 (four) times daily -  before meals and at bedtime.   TRUEplus Lancets 28G Misc 1 each by Other route in the morning, at noon, in the evening, and  at bedtime.   Turmeric 500 MG Caps Take 500 mg by mouth 2 (two) times daily.   Vitamin D (Ergocalciferol) 1.25 MG (50000 UNIT) Caps capsule Commonly known as: DRISDOL Take 1 capsule (50,000 Units total) by mouth every 7 (seven) days. What changed: when to take this       Time coordinating discharge: 35 minutes  The results of significant diagnostics from this hospitalization (including imaging, microbiology, ancillary and laboratory) are listed below for reference.    Procedures and Diagnostic Studies:   CT Head Wo Contrast  Result Date: 11/20/2020 CLINICAL DATA:  Syncopal episode EXAM: CT HEAD WITHOUT CONTRAST TECHNIQUE: Contiguous axial images were obtained from the base of the skull through the vertex without intravenous contrast. COMPARISON:  July 02, 2020 FINDINGS: Brain: No evidence of acute infarction, hemorrhage, hydrocephalus, extra-axial collection or mass lesion/mass effect. Vascular: No hyperdense vessel or unexpected calcification. Skull: Normal. Negative for fracture or focal lesion. Sinuses/Orbits: No acute finding. Other: None. IMPRESSION: No acute intracranial abnormality. Electronically Signed   By: Valentino Saxon MD   On: 11/20/2020 14:16   CT Angio Chest PE W and/or Wo Contrast  Result Date: 11/20/2020 CLINICAL DATA:  Syncopal episode EXAM: CT ANGIOGRAPHY CHEST WITH CONTRAST TECHNIQUE: Multidetector CT imaging of the chest was performed using the standard protocol during bolus administration of intravenous contrast. Multiplanar CT image reconstructions and MIPs were obtained to evaluate the vascular anatomy. CONTRAST:  140m OMNIPAQUE IOHEXOL 350 MG/ML  SOLN COMPARISON:  None. FINDINGS: Cardiovascular: Nondiagnostic exam for pulmonary embolism. Patient is unable to follow instructions and artifact related to arm positioning as well as respiratory motion markedly degrade quality. No central pulmonary embolism. No pericardial effusion. Heart is enlarged. Aorta is  normal in caliber. Mediastinum/Nodes: Visualized thyroid is unremarkable. No axillary or mediastinal adenopathy. Lungs/Pleura: No pleural effusion or pneumothorax. Bibasilar atelectasis. Scattered areas of peripheral ground-glass opacities (series 8, image 72). Upper Abdomen: No acute abnormality. Musculoskeletal: No acute osseous abnormality. Review of the MIP images confirms the above findings. IMPRESSION: 1. Nondiagnostic exam for pulmonary embolism due to patient inability to follow instructions/respiratory motion and artifact related to arm positioning. No central pulmonary embolism. If persistent concern for pulmonary embolism, recommend dedicated V/Q scan versus repeat scan when patient is able to follow instructions. 2. Scattered areas of peripheral ground-glass opacities. These are likely infectious or inflammatory in etiology. COVID-19 infection could present similarly. Electronically Signed   By: Valentino Saxon MD   On: 11/20/2020 14:25   DG Chest Port 1 View  Result Date: 11/20/2020 CLINICAL DATA:  Near syncopal episode.  Question sepsis. EXAM: PORTABLE CHEST 1 VIEW COMPARISON:  One-view chest x-ray 11/12/2020 FINDINGS: Heart is enlarged. Mild pulmonary vascular congestion is present. Lung volumes are low. No significant airspace consolidation is present. IMPRESSION: Cardiomegaly and mild pulmonary vascular congestion. Electronically Signed   By: San Morelle M.D.   On: 11/20/2020 11:23     Labs:   Basic Metabolic Panel: Recent Labs  Lab 11/20/20 1034 11/21/20 0854 11/22/20 0343 11/23/20 0433  NA 143 141 139 139  K 4.1 3.5 3.4* 3.8  CL 107 106 102 105  CO2 _0 21*  GLUCOSE 185* 117* 190* 153*  BUN _1 CREATININE 1.25* 1.12* 0.97 1.01*  CALCIUM 9.1 8.6* 8.4* 8.7*  MG  --  1.5* 1.4* 1.8  PHOS  --  2.7 3.7 4.0   GFR Estimated Creatinine Clearance: 90.5 mL/min (A) (by C-G formula based on SCr of 1.01 mg/dL (H)). Liver Function Tests: Recent Labs  Lab  11/20/20 1034 11/21/20 0854 11/22/20 0343 11/23/20 0433  AST _2 ALT _3 ALKPHOS 68 65 66 69  BILITOT 0.6 0.7 0.7 0.6  PROT 7.5 6.7 6.2* 6.2*  ALBUMIN 3.6 3.2* 3.0* 3.1*   No results for input(s): LIPASE, AMYLASE in the last 168 hours. Recent Labs  Lab 11/20/20 1036  AMMONIA 43*   Coagulation profile Recent Labs  Lab 11/20/20 1112  INR 1.0    CBC: Recent Labs  Lab 11/20/20 1034 11/22/20 0343 11/23/20 0433  WBC 10.8* 8.2 7.7  NEUTROABS 8.3* 4.5 4.4  HGB 9.9* 8.7* 8.6*  HCT 33.0* 29.6* 29.2*  MCV 72.7* 72.5* 73.7*  PLT 236 232 183   Cardiac Enzymes: No results for input(s): CKTOTAL, CKMB, CKMBINDEX, TROPONINI in the last 168 hours. BNP: Invalid input(s): POCBNP CBG: Recent Labs  Lab 11/21/20 1652 11/21/20 2121 11/22/20 1622 11/22/20 2155 11/23/20 0806  GLUCAP 172* 164* 278* 226* 201*   D-Dimer Recent Labs    11/22/20 0343 11/23/20 0433  DDIMER 0.36 0.36   Hgb A1c No results for input(s): HGBA1C in the last 72 hours. Lipid Profile Recent Labs    11/20/20 1034  TRIG 280*   Thyroid function studies No results for input(s): TSH, T4TOTAL, T3FREE, THYROIDAB in the last 72 hours.  Invalid input(s): FREET3 Anemia work up [employer]    11/22/20 0343 11/23/20 [phone]  FERRITIN 30 1   Microbiology Recent Results (from the past 240 hour(s))  Blood culture (routine single)     Status: Abnormal   Collection Time: 11/20/20 10:42 AM   Specimen: BLOOD  Result Value Ref Range Status   Specimen Description   Final    BLOOD LEFT ANTECUBITAL Performed at [facility] [address], [place], [place] [address]    Special Requests   Final    BOTTLES DRAWN AEROBIC AND ANAEROBIC Blood Culture adequate volume Performed at [facility] [address] [place], [place] [address]    Culture  Setup Time   Final    GRAM POSITIVE COCCI IN CHAINS ANAEROBIC BOTTLE ONLY CRITICAL RESULT CALLED TO, READ  BACK BY AND VERIFIED WITH: M,MICHAEL PHARMD _0  11/21/20 EB Performed at [facility], [place] [address] [place], [place] [address]    Culture STREPTOCOCCUS PARASANGUINIS (A)  Final   Report Status 11/23/2020 FINAL  Final   Organism ID, Bacteria STREPTOCOCCUS PARASANGUINIS  Final      Susceptibility   Streptococcus parasanguinis - MIC*    PENICILLIN 0.12 SENSITIVE Sensitive     CEFTRIAXONE 0.25 SENSITIVE Sensitive     LEVOFLOXACIN 1 SENSITIVE Sensitive     VANCOMYCIN 0.25 SENSITIVE Sensitive     * STREPTOCOCCUS PARASANGUINIS  Blood Culture ID Panel (Reflexed)     Status: Abnormal   Collection Time: 11/20/20 10:42 AM  Result Value Ref Range Status   Enterococcus faecalis NOT DETECTED NOT DETECTED Final   Enterococcus Faecium NOT DETECTED NOT DETECTED Final   Listeria monocytogenes NOT DETECTED NOT DETECTED Final   Staphylococcus species NOT DETECTED NOT DETECTED Final   Staphylococcus aureus (BCID) NOT DETECTED NOT DETECTED Final   Staphylococcus epidermidis NOT DETECTED NOT DETECTED Final   Staphylococcus lugdunensis NOT DETECTED NOT DETECTED Final   Streptococcus species DETECTED (A) NOT DETECTED Final    Comment: Not Enterococcus species, Streptococcus agalactiae, Streptococcus pyogenes, or Streptococcus pneumoniae. CRITICAL RESULT CALLED TO, READ BACK BY AND VERIFIED WITH: M,MICHAEL PHARMD _1  11/21/20 EB    Streptococcus agalactiae NOT DETECTED NOT DETECTED Final   Streptococcus pneumoniae NOT DETECTED NOT DETECTED Final   Streptococcus pyogenes NOT DETECTED NOT DETECTED Final   A.calcoaceticus-baumannii NOT DETECTED NOT DETECTED Final   Bacteroides fragilis NOT DETECTED NOT DETECTED Final   Enterobacterales NOT DETECTED NOT DETECTED Final   Enterobacter cloacae complex NOT DETECTED NOT DETECTED Final   Escherichia coli NOT DETECTED NOT DETECTED Final   Klebsiella aerogenes NOT DETECTED NOT DETECTED Final   Klebsiella oxytoca NOT DETECTED NOT DETECTED Final   Klebsiella  pneumoniae NOT DETECTED NOT DETECTED Final   Proteus species NOT DETECTED NOT DETECTED Final   Salmonella species NOT DETECTED NOT DETECTED Final   Serratia marcescens NOT DETECTED NOT DETECTED Final   Haemophilus influenzae NOT DETECTED NOT DETECTED Final   Neisseria meningitidis NOT DETECTED NOT DETECTED Final   Pseudomonas aeruginosa NOT DETECTED NOT DETECTED Final   Stenotrophomonas maltophilia NOT DETECTED NOT DETECTED Final   Candida albicans NOT DETECTED NOT DETECTED Final   Candida auris NOT DETECTED NOT DETECTED Final   Candida glabrata NOT DETECTED NOT DETECTED Final   Candida krusei NOT DETECTED NOT DETECTED Final   Candida parapsilosis NOT DETECTED NOT DETECTED Final   Candida tropicalis NOT DETECTED NOT DETECTED Final   Cryptococcus neoformans/gattii NOT DETECTED NOT DETECTED Final    Comment: Performed at [facility], [address], [place], [place] [address]  Urine culture  Status: Abnormal   Collection Time: 11/20/20  4:44 PM   Specimen: In/Out Cath Urine  Result Value Ref Range Status   Specimen Description   Final    IN/OUT CATH URINE Performed at [facility], [place] [address], [place], [place] [address]    Special Requests   Final    NONE Performed at [facility] [facility], [place] [address] [place], [place] [address]    Culture MULTIPLE SPECIES PRESENT, SUGGEST RECOLLECTION (A)  Final   Report Status 11/22/2020 FINAL  Final     Signed: Marlowe Aschoff Candra Wegner  [facility] 11/23/2020, 9:22 AM

## 2020-11-23 NOTE — Plan of Care (Signed)
  Problem: Education: Goal: Knowledge of risk factors and measures for prevention of condition will improve Outcome: Progressing   Problem: Coping: Goal: Psychosocial and spiritual needs will be supported Outcome: Progressing   Problem: Activity: Goal: Risk for activity intolerance will decrease Outcome: Progressing   Problem: Safety: Goal: Ability to remain free from injury will improve Outcome: Progressing   

## 2020-11-24 ENCOUNTER — Telehealth: Payer: Self-pay | Admitting: *Deleted

## 2020-11-24 ENCOUNTER — Other Ambulatory Visit: Payer: Medicaid Other

## 2020-11-24 LAB — BLOOD CULTURE ID PANEL (REFLEXED) - BCID2

## 2020-11-24 NOTE — Progress Notes (Signed)
PHARMACY - PHYSICIAN COMMUNICATION CRITICAL VALUE ALERT - BLOOD CULTURE IDENTIFICATION (BCID)  J[facility] is an 45 y.o. female who presented to [facility] on 11/20/2020 with a chief complaint of acute metabolic encephalopathy, discharged on 2/8  Assessment:  Repeat BCx's from 2/7 with 1 bottle growing staph epi  Name of physician (or Provider) Contacted: Dahal  Current antibiotics: none, patient did not have any fevers, elevated WBC during admission and was not on abx's while admitted and not discharged on abx's on 2/8  Changes to prescribed antibiotics recommended:  Continue with no current abx's  Results for orders placed or performed during the hospital encounter of 11/20/20  Blood Culture ID Panel (Reflexed) (Collected: 11/22/2020 10:10 AM)  Result Value Ref Range   Enterococcus faecalis NOT DETECTED NOT DETECTED   Enterococcus Faecium NOT DETECTED NOT DETECTED   Listeria monocytogenes NOT DETECTED NOT DETECTED   Staphylococcus species DETECTED (A) NOT DETECTED   Staphylococcus aureus (BCID) NOT DETECTED NOT DETECTED   Staphylococcus epidermidis DETECTED (A) NOT DETECTED   Staphylococcus lugdunensis NOT DETECTED NOT DETECTED   Streptococcus species NOT DETECTED NOT DETECTED   Streptococcus agalactiae NOT DETECTED NOT DETECTED   Streptococcus pneumoniae NOT DETECTED NOT DETECTED   Streptococcus pyogenes NOT DETECTED NOT DETECTED   A.calcoaceticus-baumannii NOT DETECTED NOT DETECTED   Bacteroides fragilis NOT DETECTED NOT DETECTED   Enterobacterales NOT DETECTED NOT DETECTED   Enterobacter cloacae complex NOT DETECTED NOT DETECTED   Escherichia coli NOT DETECTED NOT DETECTED   Klebsiella aerogenes NOT DETECTED NOT DETECTED   Klebsiella oxytoca NOT DETECTED NOT DETECTED   Klebsiella pneumoniae NOT DETECTED NOT DETECTED   Proteus species NOT DETECTED NOT DETECTED   Salmonella species NOT DETECTED NOT DETECTED   Serratia marcescens NOT DETECTED NOT DETECTED   Haemophilus  influenzae NOT DETECTED NOT DETECTED   Neisseria meningitidis NOT DETECTED NOT DETECTED   Pseudomonas aeruginosa NOT DETECTED NOT DETECTED   Stenotrophomonas maltophilia NOT DETECTED NOT DETECTED   Candida albicans NOT DETECTED NOT DETECTED   Candida auris NOT DETECTED NOT DETECTED   Candida glabrata NOT DETECTED NOT DETECTED   Candida krusei NOT DETECTED NOT DETECTED   Candida parapsilosis NOT DETECTED NOT DETECTED   Candida tropicalis NOT DETECTED NOT DETECTED   Cryptococcus neoformans/gattii NOT DETECTED NOT DETECTED   Methicillin resistance mecA/C DETECTED (A) NOT DETECTED    Berkley Harvey 11/24/2020  9:01 AM

## 2020-11-24 NOTE — Telephone Encounter (Signed)
Transition Care Management Unsuccessful Follow-up Telephone Call  Date of discharge and from where:  11/23/2020 - [place] [facility]  Attempts:  1st Attempt  Reason for unsuccessful TCM follow-up call:  Left voice message 

## 2020-11-25 LAB — CULTURE, BLOOD (ROUTINE X 2)

## 2020-11-25 NOTE — Telephone Encounter (Signed)
Transition Care Management Unsuccessful Follow-up Telephone Call  Date of discharge and from where:  11/23/2020 - [place] [facility]  Attempts:  2nd Attempt  Reason for unsuccessful TCM follow-up call:  Left voice message 

## 2020-11-26 ENCOUNTER — Encounter
Payer: Medicaid Other | Attending: Physical Medicine and Rehabilitation | Admitting: Physical Medicine and Rehabilitation

## 2020-11-26 DIAGNOSIS — R1033 Periumbilical pain: Secondary | ICD-10-CM | POA: Insufficient documentation

## 2020-11-26 DIAGNOSIS — M797 Fibromyalgia: Secondary | ICD-10-CM | POA: Insufficient documentation

## 2020-11-26 NOTE — Telephone Encounter (Signed)
Transition Care Management Unsuccessful Follow-up Telephone Call  Date of discharge and from where:  11/23/2020 - [facility]  Attempts:  3rd Attempt  Reason for unsuccessful TCM follow-up call:  Left voice message

## 2020-11-27 LAB — CULTURE, BLOOD (ROUTINE X 2)
Culture: NO GROWTH
Special Requests: ADEQUATE

## 2020-12-02 ENCOUNTER — Ambulatory Visit (INDEPENDENT_AMBULATORY_CARE_PROVIDER_SITE_OTHER): Payer: Medicaid Other | Admitting: Nurse Practitioner

## 2020-12-02 ENCOUNTER — Telehealth: Payer: Self-pay

## 2020-12-02 ENCOUNTER — Encounter: Payer: Self-pay | Admitting: Nurse Practitioner

## 2020-12-02 ENCOUNTER — Other Ambulatory Visit: Payer: Self-pay

## 2020-12-02 VITALS — BP 167/60 | HR 125 | Ht 64.0 in | Wt 280.0 lb

## 2020-12-02 DIAGNOSIS — I10 Essential (primary) hypertension: Secondary | ICD-10-CM | POA: Diagnosis not present

## 2020-12-02 DIAGNOSIS — Z794 Long term (current) use of insulin: Secondary | ICD-10-CM | POA: Diagnosis not present

## 2020-12-02 DIAGNOSIS — K219 Gastro-esophageal reflux disease without esophagitis: Secondary | ICD-10-CM | POA: Diagnosis not present

## 2020-12-02 DIAGNOSIS — E119 Type 2 diabetes mellitus without complications: Secondary | ICD-10-CM

## 2020-12-02 DIAGNOSIS — R232 Flushing: Secondary | ICD-10-CM | POA: Diagnosis not present

## 2020-12-02 DIAGNOSIS — R0981 Nasal congestion: Secondary | ICD-10-CM

## 2020-12-02 DIAGNOSIS — R000 Tachycardia, unspecified: Secondary | ICD-10-CM

## 2020-12-02 MED ORDER — TIZANIDINE HCL 4 MG PO CAPS: 4.0000 mg | ORAL_CAPSULE | Freq: Three times a day (TID) | ORAL | 11 refills | Status: DC

## 2020-12-02 MED ORDER — PANTOPRAZOLE SODIUM 40 MG PO TBEC: 40.0000 mg | DELAYED_RELEASE_TABLET | Freq: Every day | ORAL | 3 refills | Status: DC

## 2020-12-02 MED ORDER — FLUTICASONE PROPIONATE 50 MCG/ACT NA SUSP: 2.0000 | Freq: Every day | NASAL | 6 refills | Status: DC

## 2020-12-02 MED ORDER — SAXAGLIPTIN HCL 5 MG PO TABS: 5.0000 mg | ORAL_TABLET | Freq: Every day | ORAL | 3 refills | Status: DC

## 2020-12-02 MED ORDER — LOSARTAN POTASSIUM 50 MG PO TABS: 50.0000 mg | ORAL_TABLET | Freq: Every day | ORAL | 3 refills | Status: DC

## 2020-12-02 MED ORDER — FERROUS SULFATE 325 (65 FE) MG PO TABS: 325.0000 mg | ORAL_TABLET | Freq: Three times a day (TID) | ORAL | 3 refills | Status: DC

## 2020-12-02 MED ORDER — INSULIN LISPRO PROT & LISPRO (75-25 MIX) 100 UNIT/ML KWIKPEN: PEN_INJECTOR | SUBCUTANEOUS | 11 refills | Status: DC

## 2020-12-02 NOTE — Progress Notes (Signed)
Jill Shaw, [place]  [id number] Phone:  [phone]   Fax:  [phone]   Established Patient Office Visit  Subjective:  Patient ID: [facility], female    DOB: 1976/03/31  Age: 45 y.o. MRN: [id number]  CC:  Chief Complaint  Patient presents with  . Diabetes    HPI [facility] presents for follow up. She  has a past medical history of Anemia, Arthritis, Asthma, Chronic diastolic (congestive) heart failure ([place]), COPD (chronic obstructive pulmonary disease) ([place]), Diabetes mellitus without complication ([place]), Dysfunctional uterine bleeding, GERD (gastroesophageal reflux disease), Hypertension, Neuromuscular disorder ([place]), Seizures ([place]) (09/12/2017), Sickle cell trait ([place]), Smoker, Vitamin D deficiency (10/2019), and Wears glasses.   Diabetes Mellitus Patient presents for follow up of diabetes. Current symptoms include: hyperglycemia and GERD. Symptoms have gradually worsened. Patient denies foot ulcerations, hypoglycemia  and vomiting. Evaluation to date has included: fasting blood sugar, fasting lipid panel, hemoglobin A1C and microalbuminuria.  Home sugars: BGs are high in the morning, BGs are high in the evening, BGs are high around dinner, BGs are high at bedtime. Current treatment: Continued insulin which has been not very effective, Continued statin which has been somewhat effective, Continued ACE inhibitor/ARB which has been somewhat effective and Continued Saxagliptin which has been not very effective. Last dilated eye exam: 2021.  She was recently in the hospital and experienced hypoglycemia in the ED entire hospitalization.  Request for insulin change was denied.  Patient has been on insulin lispro/ Humalog mix 75\25 full approximately 25 years.  A1c greater than 9%.  She did not get established with endocrinology due to having Covid. She admits that during her hospital stay she did have a change in her cardiac medications due to  her increased heart rate and BP.  Her metoprolol was increased to 200 mg daily,Denies headache, dizziness, visual changes, chest pain, nausea, vomiting. She admits that she did smoke on yesterday which was her birthday due to stress.    Past Medical History:  Diagnosis Date  . Anemia   . Arthritis    knees, hands  . Asthma   . Chronic diastolic (congestive) heart failure ([place])   . COPD (chronic obstructive pulmonary disease) ([place])   . Diabetes mellitus without complication ([employer])    type 2  . Dysfunctional uterine bleeding   . GERD (gastroesophageal reflux disease)   . Hypertension   . Neuromuscular disorder ([facility])    neuropathy feet  . Seizures ([place]) 09/12/2017   pt states r/t stress and blood sugar - no meds last one 4 months ago, not seen neurologist  . Sickle cell trait ([place])   . Smoker   . Vitamin D deficiency 10/2019  . Wears glasses     Past Surgical History:  Procedure Laterality Date  . CESAREAN SECTION     x 1. for twins  . DILATION AND CURETTAGE OF UTERUS N/A 08/20/2019   Procedure: DILATATION AND CURETTAGE;  Surgeon: Emily Filbert, MD;  Location: [facility];  Service: Gynecology;  Laterality: N/A;  . ENDOMETRIAL ABLATION N/A 08/20/2019   Procedure: [place] Ablation;  Surgeon: Emily Filbert, MD;  Location: [facility];  Service: Gynecology;  Laterality: N/A;  . EYE SURGERY Bilateral    laser right and cataract removed left eye  . RADIOLOGY WITH ANESTHESIA N/A 09/16/2019   Procedure: MRI WITH ANESTHESIA   L SPINE WITHOUT CONTRAST, T SPINE WITHOUT CONTRAST , CERVICAL WITHOUT CONTRAST;  Surgeon: Radiologist, Medication, MD;  Location: [facility];  Service: Radiology;  Laterality: N/A;  . TUBAL LIGATION     interval BTL  . UPPER GI ENDOSCOPY  07/2017    Family History  Problem Relation Age of Onset  . Diabetes Mother   . Hypertension Mother     Social History   Socioeconomic History  . Marital status: Legally Separated    Spouse name: Not on file  . Number of children: 3  .  Years of education: Not on file  . Highest education level: Not on file  Occupational History  . Occupation: unemployed  Tobacco Use  . Smoking status: Former Smoker    Packs/day: 0.25    Years: 26.00    Pack years: 6.50    Types: Cigarettes    Quit date: 09/13/2020    Years since quitting: 0.2  . Smokeless tobacco: Never Used  . Tobacco comment: 4-5 cigarettes/day  Vaping Use  . Vaping Use: Never used  Substance and Sexual Activity  . Alcohol use: No  . Drug use: No  . Sexual activity: Not Currently    Birth control/protection: None  Other Topics Concern  . Not on file  Social History Narrative   Right Handed   Lives in a one story apartment, but lives on the second floor   Drinks caffeine once in awhile   Social Determinants of Health   Financial Resource Strain: Not on file  Food Insecurity: No Food Insecurity  . Worried About Programme researcher, broadcasting/film/video in the Last Year: Never true  . Ran Out of Food in the Last Year: Never true  Transportation Needs: No Transportation Needs  . Lack of Transportation (Medical): No  . Lack of Transportation (Non-Medical): No  Physical Activity: Not on file  Stress: Not on file  Social Connections: Not on file  Intimate Partner Violence: Not on file    Outpatient Medications Prior to Visit  Medication Sig Dispense Refill  . ACCU-CHEK GUIDE test strip USE AS DIRECTED UP TO FOUR TIMES DAILY 100 strip 4  . albuterol (PROVENTIL) (2.5 MG/3ML) 0.083% nebulizer solution Take 3 mLs (2.5 mg total) by nebulization every 6 (six) hours as needed for wheezing or shortness of breath. 150 mL 6  . albuterol (VENTOLIN HFA) 108 (90 Base) MCG/ACT inhaler Inhale 2 puffs into the lungs every 6 (six) hours as needed for wheezing or shortness of breath. 8 g 6  . blood glucose meter kit and supplies KIT 1 each by Other route See admin instructions. Dispense based on patient and insurance preference. Use up to four times daily as directed. (FOR ICD-9 250.00,  250.01). 1 each 2  . budesonide-formoterol (SYMBICORT) 160-4.5 MCG/ACT inhaler INHALE 2 PUFFS INTO THE LUNGS 2 (TWO) TIMES DAILY. (Patient taking differently: Inhale 2 puffs into the lungs 2 (two) times daily.) 10.2 g 6  . buPROPion (WELLBUTRIN SR) 150 MG 12 hr tablet Take 1 tablet (150 mg total) by mouth 2 (two) times daily. 60 tablet 11  . diclofenac Sodium (VOLTAREN) 1 % GEL Apply 4 g topically 4 (four) times daily as needed (pain).    . furosemide (LASIX) 40 MG tablet Take 1 tablet (40 mg total) by mouth 2 (two) times daily. 60 tablet 5  . Glucosamine Sulfate 1000 MG CAPS Take 1 capsule (1,000 mg total) by mouth 2 (two) times daily. (Patient taking differently: Take 1,000 mg by mouth 2 (two) times daily.) 180 capsule 3  . guaiFENesin-dextromethorphan (ROBITUSSIN DM) 100-10 MG/5ML syrup Take 10 mLs by mouth  every 4 (four) hours as needed for cough. 118 mL 0  . hydrALAZINE (APRESOLINE) 50 MG tablet Take 50 mg by mouth 3 (three) times daily. 90 tablet 0  . hydrocortisone 2.5 % cream Apply topically 2 (two) times daily. (Patient taking differently: Apply 1 application topically 2 (two) times daily.) 30 g 11  . hydroquinone 4 % cream APPLY TOPICALLY TWICE DAILY (Patient taking differently: Apply 1 application topically 2 (two) times daily.) 28.35 g 0  . Insulin Pen Needle (PEN NEEDLES) 30G X 5 MM MISC 1 Units by Does not apply route as directed. 100 each 11  . lidocaine (XYLOCAINE) 5 % ointment Apply 1 application topically as needed. (Patient taking differently: Apply 1 application topically as needed for mild pain.) 35.44 g 0  . meloxicam (MOBIC) 7.5 MG tablet Take 7.5 mg by mouth daily.    . metoprolol succinate (TOPROL-XL) 200 MG 24 hr tablet Take 1 tablet (200 mg total) by mouth daily. Take with or immediately following a meal. (Patient taking differently: Take 200 mg by mouth daily.) 90 tablet 2  . nicotine (NICODERM CQ) 21 mg/24hr patch Place 1 patch (21 mg total) onto the skin daily. 28 patch 3   . norethindrone (AYGESTIN) 5 MG tablet Take 2 tablets (10 mg total) by mouth in the morning, at noon, in the evening, and at bedtime. With bleeding 120 tablet 0  . potassium chloride SA (KLOR-CON) 20 MEQ tablet Take 20 mEq by mouth daily.    Marland Kitchen rOPINIRole (REQUIP) 0.25 MG tablet Take 1 tablet (0.25 mg total) by mouth 3 (three) times daily. 90 tablet 2  . rosuvastatin (CRESTOR) 5 MG tablet Take 1 tablet (5 mg total) by mouth daily. 90 tablet 3  . spironolactone (ALDACTONE) 50 MG tablet Take 1 tablet (50 mg total) by mouth daily. 30 tablet 3  . Tiotropium Bromide Monohydrate (SPIRIVA RESPIMAT) 2.5 MCG/ACT AERS Inhale 2 puffs into the lungs daily. 4 g 6  . Turmeric 500 MG CAPS Take 500 mg by mouth 2 (two) times daily. 180 capsule 3  . Vitamin D, Ergocalciferol, (DRISDOL) 1.25 MG (50000 UNIT) CAPS capsule Take 1 capsule (50,000 Units total) by mouth every 7 (seven) days. (Patient taking differently: Take 50,000 Units by mouth every Friday.) 5 capsule 6  . ferrous sulfate 325 (65 FE) MG tablet Take 1 tablet (325 mg total) by mouth 3 (three) times daily with meals. (Patient taking differently: Take 325 mg by mouth daily with breakfast.) 30 tablet 3  . fluticasone (FLONASE) 50 MCG/ACT nasal spray Place 2 sprays into both nostrils daily. 16 g 6  . HYDROmorphone (DILAUDID) 2 MG tablet Take 1 tablet (2 mg total) by mouth every 8 (eight) hours as needed for severe pain. 30 tablet 0  . Insulin Lispro Prot & Lispro (HUMALOG MIX 75/25 KWIKPEN) (75-25) 100 UNIT/ML Kwikpen Inject 120 Units into the skin 2 (two) times daily. INJECT 110 UNITS EVERY 12 HOURS (Patient taking differently: Inject 110 Units into the skin 2 (two) times daily.) 72 mL 11  . losartan (COZAAR) 50 MG tablet TAKE 1 TABLET($RemoveBefor'50MG'zBqeqhPysRAY$  TOTAL) BY MOUTH EVERY DAY (Patient taking differently: Take 50 mg by mouth daily.) 30 tablet 2  . omeprazole (PRILOSEC) 40 MG capsule TAKE 1 CAPSULE($RemoveBefore'40MG'YtSwihhCqegmq$  TOTAL) BY MOUTH EVERY DAY (Patient taking differently: Take 40 mg by  mouth daily.) 30 capsule 2  . saxagliptin HCl (ONGLYZA) 2.5 MG TABS tablet Take 1 tablet (2.5 mg total) by mouth daily. 90 tablet 3  . Blood Glucose Monitoring  Suppl (TRUE METRIX METER) w/Device KIT 1 each by Does not apply route 4 (four) times daily -  before meals and at bedtime. 1 kit 0  . Continuous Blood Gluc Receiver (FREESTYLE LIBRE 14 DAY READER) DEVI 1 application by Does not apply route every 14 (fourteen) days. 1 each 0  . sucralfate (CARAFATE) 1 GM/10ML suspension Take 10 mLs (1 g total) by mouth 4 (four) times daily -  with meals and at bedtime. 420 mL 0  . TRUEplus Lancets 28G MISC 1 each by Other route in the morning, at noon, in the evening, and at bedtime.     No facility-administered medications prior to visit.    Allergies  Allergen Reactions  . Ketoprofen Nausea And Vomiting  . Aspirin Nausea Only  . Gabapentin Nausea And Vomiting and Other (See Comments)    upset stomach  . Ibuprofen Nausea And Vomiting  . Liraglutide Nausea And Vomiting  . Naproxen Nausea And Vomiting  . Omeprazole-Sodium Bicarbonate Nausea And Vomiting  . Sulfa Antibiotics Nausea And Vomiting  . Tramadol Nausea And Vomiting and Other (See Comments)    stomach upset    ROS Review of Systems  Constitutional: Positive for unexpected weight change.  Respiratory: Positive for cough and shortness of breath. Negative for chest tightness.   Cardiovascular: Negative for chest pain and palpitations.  Gastrointestinal:       Burning       Objective:    Physical Exam Constitutional:      Appearance: She is obese. She is not ill-appearing, toxic-appearing or diaphoretic.  HENT:     Head: Normocephalic and atraumatic.     Nose: Nose normal.     Mouth/Throat:     Mouth: Mucous membranes are moist.  Cardiovascular:     Rate and Rhythm: Regular rhythm. Tachycardia present.     Heart sounds: Normal heart sounds.  Pulmonary:     Breath sounds: Normal breath sounds.     Comments: No increased  effort however does not like wearing the mask Musculoskeletal:     Cervical back: Normal range of motion.     Right lower leg: Edema present.     Left lower leg: Edema (no pitting) present.  Skin:    General: Skin is warm and dry.     Capillary Refill: Capillary refill takes less than 2 seconds.  Neurological:     General: No focal deficit present.     Mental Status: She is alert and oriented to person, place, and time.  Psychiatric:        Mood and Affect: Mood normal.        Behavior: Behavior normal.        Thought Content: Thought content normal.        Judgment: Judgment normal.     BP (!) 167/60   Pulse (!) 125   Ht $R'5\' 4"'Jz$  (1.626 m)   Wt 280 lb (127 kg)   SpO2 99%   BMI 48.06 kg/m  Wt Readings from Last 3 Encounters:  12/02/20 280 lb (127 kg)  11/21/20 259 lb 0.7 oz (117.5 kg)  10/29/20 278 lb (126.1 kg)     Health Maintenance Due  Topic Date Due  . PAP SMEAR-Modifier  07/25/2020    There are no preventive care reminders to display for this patient.  Lab Results  Component Value Date   TSH 3.100 11/05/2019   Lab Results  Component Value Date   WBC 7.7 11/23/2020   HGB 8.6 (L) 11/23/2020  HCT 29.2 (L) 11/23/2020   MCV 73.7 (L) 11/23/2020   PLT 183 11/23/2020   Lab Results  Component Value Date   NA 139 11/23/2020   K 3.8 11/23/2020   CO2 21 (L) 11/23/2020   GLUCOSE 153 (H) 11/23/2020   BUN 12 11/23/2020   CREATININE 1.01 (H) 11/23/2020   BILITOT 0.6 11/23/2020   ALKPHOS 69 11/23/2020   AST 26 11/23/2020   ALT 13 11/23/2020   PROT 6.2 (L) 11/23/2020   ALBUMIN 3.1 (L) 11/23/2020   CALCIUM 8.7 (L) 11/23/2020   ANIONGAP 13 11/23/2020   GFR 57.50 (L) 07/12/2020   Lab Results  Component Value Date   CHOL 135 06/02/2020   Lab Results  Component Value Date   HDL 29 (L) 06/02/2020   Lab Results  Component Value Date   LDLCALC 77 06/02/2020   Lab Results  Component Value Date   TRIG 280 (H) 11/20/2020   Lab Results  Component Value  Date   CHOLHDL 4.7 (H) 06/02/2020   Lab Results  Component Value Date   HGBA1C 9.5 (H) 10/16/2020      Assessment & Plan:   Problem List Items Addressed This Visit      Cardiovascular and Mediastinum   Essential hypertension (Chronic) Persistent patient to follow-up with cardiology on Monday for further evaluation stress test and further cardiac work-up pending Tachycardia patient to take 1 additional metoprolol 100 mg when she returns home and will notify cardiology of symptoms persist   Relevant Medications   hydrALAZINE (APRESOLINE) 50 MG tablet   losartan (COZAAR) 50 MG tablet   Hot flashes Worsening   Relevant Medications   hydrALAZINE (APRESOLINE) 50 MG tablet   losartan (COZAAR) 50 MG tablet   Other Relevant Orders   TSH (Completed)     Digestive   Gastroesophageal reflux disease Worsening we will trial pantoprazole 40 mg Encourage patient to monitor diet for triggers Avoid lying down with the ED Encourage weight loss   Relevant Medications   pantoprazole (PROTONIX) 40 MG tablet    Other Visit Diagnoses    Type 2 diabetes mellitus without complication, with long-term current use of insulin ([place])    -  Primary Uncontrolled endocrinology appointment upcoming no changes in insulin lispro due to previous recommendations Increase saxagliptin 5 mg daily Long discussion with patient on lifestyle modifications reinforced   Relevant Medications   losartan (COZAAR) 50 MG tablet   saxagliptin HCl (ONGLYZA) 5 MG TABS tablet   Insulin Lispro Prot & Lispro (HUMALOG MIX 75/25 KWIKPEN) (75-25) 100 UNIT/ML Kwikpen   Other Relevant Orders   Comp. Metabolic Panel (12) (Completed)   Nasal congestion       Relevant Medications   fluticasone (FLONASE) 50 MCG/ACT nasal spray      Meds ordered this encounter  Medications  . pantoprazole (PROTONIX) 40 MG tablet    Sig: Take 1 tablet (40 mg total) by mouth daily.    Dispense:  90 tablet    Refill:  3    Order Specific  Question:   Supervising Provider    Answer:   Tresa Garter [id number]  . ferrous sulfate 325 (65 FE) MG tablet    Sig: Take 1 tablet (325 mg total) by mouth 3 (three) times daily with meals.    Dispense:  270 tablet    Refill:  3    Order Specific Question:   Supervising Provider    Answer:   Tresa Garter [id number]  . fluticasone (FLONASE) 50  MCG/ACT nasal spray    Sig: Place 2 sprays into both nostrils daily.    Dispense:  16 g    Refill:  6    Order Specific Question:   Supervising Provider    Answer:   Tresa Garter [id number]  . losartan (COZAAR) 50 MG tablet    Sig: Take 1 tablet (50 mg total) by mouth daily. TAKE 1 TABLET($RemoveBefor'50MG'hWcNBTJTRfny$  TOTAL) BY MOUTH EVERY DAY    Dispense:  90 tablet    Refill:  3    Order Specific Question:   Supervising Provider    Answer:   Tresa Garter [id number]  . saxagliptin HCl (ONGLYZA) 5 MG TABS tablet    Sig: Take 1 tablet (5 mg total) by mouth daily.    Dispense:  90 tablet    Refill:  3    Order Specific Question:   Supervising Provider    Answer:   Tresa Garter [id number]  . Insulin Lispro Prot & Lispro (HUMALOG MIX 75/25 KWIKPEN) (75-25) 100 UNIT/ML Kwikpen    Sig: 110 units every 12 hours    Dispense:  144 mL    Refill:  11    Order Specific Question:   Supervising Provider    Answer:   Tresa Garter [[id number]]  . tiZANidine (ZANAFLEX) 4 MG capsule    Sig: Take 1 capsule (4 mg total) by mouth 3 (three) times daily. Do not drive while taking this medication.    Dispense:  90 capsule    Refill:  11    Order Specific Question:   Supervising Provider    Answer:   Tresa Garter [[id number]]    Follow-up: No follow-ups on file.    Vevelyn Francois, NP

## 2020-12-03 LAB — COMP. METABOLIC PANEL (12)
AST: 20 IU/L (ref 0–40)
Albumin/Globulin Ratio: 1.4 (ref 1.2–2.2)
Albumin: 4 g/dL (ref 3.8–4.8)
Alkaline Phosphatase: 137 IU/L — ABNORMAL HIGH (ref 44–121)
BUN/Creatinine Ratio: 7 — ABNORMAL LOW (ref 9–23)
BUN: 10 mg/dL (ref 6–24)
Bilirubin Total: 0.3 mg/dL (ref 0.0–1.2)
Calcium: 9.2 mg/dL (ref 8.7–10.2)
Chloride: 90 mmol/L — ABNORMAL LOW (ref 96–106)
Creatinine, Ser: 1.48 mg/dL — ABNORMAL HIGH (ref 0.57–1.00)
GFR calc Af Amer: 49 mL/min/{1.73_m2} — ABNORMAL LOW (ref >59)
GFR calc non Af Amer: 43 mL/min/{1.73_m2} — ABNORMAL LOW (ref >59)
Globulin, Total: 2.8 g/dL (ref 1.5–4.5)
Glucose: 536 mg/dL (ref 65–99)
Potassium: 3.8 mmol/L (ref 3.5–5.2)
Sodium: 136 mmol/L (ref 134–144)
Total Protein: 6.8 g/dL (ref 6.0–8.5)

## 2020-12-03 LAB — TSH: TSH: 1.25 u[IU]/mL (ref 0.450–4.500)

## 2020-12-04 ENCOUNTER — Encounter: Payer: Self-pay | Admitting: Nurse Practitioner

## 2020-12-06 ENCOUNTER — Ambulatory Visit: Payer: Medicaid Other | Admitting: Cardiology

## 2020-12-06 ENCOUNTER — Other Ambulatory Visit: Payer: Medicaid Other

## 2020-12-06 ENCOUNTER — Other Ambulatory Visit: Payer: Self-pay | Admitting: Nurse Practitioner

## 2020-12-06 MED ORDER — MELOXICAM 7.5 MG PO TABS
7.5000 mg | ORAL_TABLET | Freq: Every day | ORAL | 0 refills | Status: AC
Start: 2020-12-06 — End: 2021-01-05

## 2020-12-06 NOTE — Telephone Encounter (Signed)
sent 

## 2020-12-06 NOTE — Telephone Encounter (Signed)
Okay. Schedule at her convenience.  Thanks MJP

## 2020-12-11 DIAGNOSIS — S36116A Major laceration of liver, initial encounter: Secondary | ICD-10-CM | POA: Diagnosis not present

## 2020-12-11 DIAGNOSIS — R109 Unspecified abdominal pain: Secondary | ICD-10-CM | POA: Diagnosis not present

## 2020-12-11 DIAGNOSIS — S36113A Laceration of liver, unspecified degree, initial encounter: Secondary | ICD-10-CM | POA: Insufficient documentation

## 2020-12-14 ENCOUNTER — Other Ambulatory Visit: Payer: Self-pay | Admitting: Obstetrics and Gynecology

## 2020-12-14 DIAGNOSIS — Z8781 Personal history of (healed) traumatic fracture: Secondary | ICD-10-CM

## 2020-12-14 HISTORY — DX: Personal history of (healed) traumatic fracture: Z87.81

## 2020-12-14 NOTE — Patient Outreach (Signed)
Care Coordination  12/14/2020  Jill Shaw October 10, 1976 [id number]    Medicaid Managed Care   Unsuccessful Outreach Note  12/14/2020 Name: [facility] MRN: [id number] DOB: 09/11/1976  Referred by: Barbette Merino, NP Reason for referral : High Risk Managed Medicaid (Unsuccessful telephone outreach)   An unsuccessful telephone outreach was attempted today. The patient was referred to the case management team for assistance with care management and care coordination.   Follow Up Plan: A member of the Managed Medicaid  care management team will reach out to the patient again over the next 7 days.   Kathi Der RN, BSN [place]  [facility], [facility] - Managed Medicaid High Risk [phone].

## 2020-12-14 NOTE — Patient Instructions (Signed)
Hi Ms. Volkert we missed you today - as a part of your Medicaid benefit, you are eligible for care management and care coordination services at no cost or copay. I was unable to reach you by phone today but would be happy to help you with your health related needs. Please feel free to call me at [phone].  A member of the Managed Medicaid care management team will reach out to you again over the next 7 days.   Kathi Der RN, BSN [place]  [facility] - Managed Medicaid High Risk [phone].

## 2020-12-20 ENCOUNTER — Ambulatory Visit: Payer: Medicaid Other | Admitting: Cardiology

## 2020-12-20 NOTE — Progress Notes (Deleted)
Patient referred by Vevelyn Francois, NP for congestive heart failure  Subjective:   Jill Shaw, female    DOB: 1975/11/08, 45 y.o.   MRN: [id number]   No chief complaint on file.    HPI  45 year old African-American female with hypertension, type 2 diabetes mellitus, HFpEF, COPD, tobacco dependence, morbid obesity, microcytic anemia, uterine dysfunction, recurrent syncope  At last visit, I had recommended Lexiscan nuclear stress test due to patient's chest pain complaints.  It was normal.  Patient underwent a stress test.  He was hospitalized in February 2022 with acute metabolic encephalopathy, thought to be due to combination of hypertensive urgency, as well as considered Covid infection and possible SIRS. ***    Current Outpatient Medications on File Prior to Visit  Medication Sig Dispense Refill  . ACCU-CHEK GUIDE test strip USE AS DIRECTED UP TO FOUR TIMES DAILY 100 strip 4  . albuterol (PROVENTIL) (2.5 MG/3ML) 0.083% nebulizer solution Take 3 mLs (2.5 mg total) by nebulization every 6 (six) hours as needed for wheezing or shortness of breath. 150 mL 6  . albuterol (VENTOLIN HFA) 108 (90 Base) MCG/ACT inhaler Inhale 2 puffs into the lungs every 6 (six) hours as needed for wheezing or shortness of breath. 8 g 6  . blood glucose meter kit and supplies KIT 1 each by Other route See admin instructions. Dispense based on patient and insurance preference. Use up to four times daily as directed. (FOR ICD-9 250.00, 250.01). 1 each 2  . budesonide-formoterol (SYMBICORT) 160-4.5 MCG/ACT inhaler INHALE 2 PUFFS INTO THE LUNGS 2 (TWO) TIMES DAILY. (Patient taking differently: Inhale 2 puffs into the lungs 2 (two) times daily.) 10.2 g 6  . buPROPion (WELLBUTRIN SR) 150 MG 12 hr tablet Take 1 tablet (150 mg total) by mouth 2 (two) times daily. 60 tablet 11  . diclofenac Sodium (VOLTAREN) 1 % GEL Apply 4 g topically 4 (four) times daily as needed (pain).    . ferrous sulfate 325 (65 FE) MG  tablet Take 1 tablet (325 mg total) by mouth 3 (three) times daily with meals. 270 tablet 3  . fluticasone (FLONASE) 50 MCG/ACT nasal spray Place 2 sprays into both nostrils daily. 16 g 6  . furosemide (LASIX) 40 MG tablet Take 1 tablet (40 mg total) by mouth 2 (two) times daily. 60 tablet 5  . Glucosamine Sulfate 1000 MG CAPS Take 1 capsule (1,000 mg total) by mouth 2 (two) times daily. (Patient taking differently: Take 1,000 mg by mouth 2 (two) times daily.) 180 capsule 3  . guaiFENesin-dextromethorphan (ROBITUSSIN DM) 100-10 MG/5ML syrup Take 10 mLs by mouth every 4 (four) hours as needed for cough. 118 mL 0  . hydrALAZINE (APRESOLINE) 50 MG tablet Take 50 mg by mouth 3 (three) times daily. 90 tablet 0  . hydrocortisone 2.5 % cream Apply topically 2 (two) times daily. (Patient taking differently: Apply 1 application topically 2 (two) times daily.) 30 g 11  . hydroquinone 4 % cream APPLY TOPICALLY TWICE DAILY (Patient taking differently: Apply 1 application topically 2 (two) times daily.) 28.35 g 0  . Insulin Lispro Prot & Lispro (HUMALOG MIX 75/25 KWIKPEN) (75-25) 100 UNIT/ML Kwikpen 110 units every 12 hours 144 mL 11  . Insulin Pen Needle (PEN NEEDLES) 30G X 5 MM MISC 1 Units by Does not apply route as directed. 100 each 11  . lidocaine (XYLOCAINE) 5 % ointment Apply 1 application topically as needed. (Patient taking differently: Apply 1 application topically as needed for  mild pain.) 35.44 g 0  . losartan (COZAAR) 50 MG tablet Take 1 tablet (50 mg total) by mouth daily. TAKE 1 TABLET($RemoveBefor'50MG'[id number]$  TOTAL) BY MOUTH EVERY DAY 90 tablet 3  . meloxicam (MOBIC) 7.5 MG tablet Take 1 tablet (7.5 mg total) by mouth daily. 30 tablet 0  . metoprolol succinate (TOPROL-XL) 200 MG 24 hr tablet Take 1 tablet (200 mg total) by mouth daily. Take with or immediately following a meal. (Patient taking differently: Take 200 mg by mouth daily.) 90 tablet 2  . nicotine (NICODERM CQ) 21 mg/24hr patch Place 1 patch (21 mg total)  onto the skin daily. 28 patch 3  . norethindrone (AYGESTIN) 5 MG tablet Take 2 tablets (10 mg total) by mouth in the morning, at noon, in the evening, and at bedtime. With bleeding 120 tablet 0  . pantoprazole (PROTONIX) 40 MG tablet Take 1 tablet (40 mg total) by mouth daily. 90 tablet 3  . potassium chloride SA (KLOR-CON) 20 MEQ tablet Take 20 mEq by mouth daily.    Marland Kitchen rOPINIRole (REQUIP) 0.25 MG tablet Take 1 tablet (0.25 mg total) by mouth 3 (three) times daily. 90 tablet 2  . rosuvastatin (CRESTOR) 5 MG tablet Take 1 tablet (5 mg total) by mouth daily. 90 tablet 3  . saxagliptin HCl (ONGLYZA) 5 MG TABS tablet Take 1 tablet (5 mg total) by mouth daily. 90 tablet 3  . spironolactone (ALDACTONE) 50 MG tablet Take 1 tablet (50 mg total) by mouth daily. 30 tablet 3  . Tiotropium Bromide Monohydrate (SPIRIVA RESPIMAT) 2.5 MCG/ACT AERS Inhale 2 puffs into the lungs daily. 4 g 6  . tiZANidine (ZANAFLEX) 4 MG capsule Take 1 capsule (4 mg total) by mouth 3 (three) times daily. Do not drive while taking this medication. 90 capsule 11  . Turmeric 500 MG CAPS Take 500 mg by mouth 2 (two) times daily. 180 capsule 3  . Vitamin D, Ergocalciferol, (DRISDOL) 1.25 MG (50000 UNIT) CAPS capsule Take 1 capsule (50,000 Units total) by mouth every 7 (seven) days. (Patient taking differently: Take 50,000 Units by mouth every Friday.) 5 capsule 6   No current facility-administered medications on file prior to visit.    Cardiovascular and other pertinent studies:  EKG 10/14/2020: Sinus tachycardia 111 bpm  Cannot exclude old anteroseptal infarct  EKG 09/01/2020: Sinus rhythm 85 bpm  Poor R wave progression Otherwise normal EKG  Vascular US 07/16/2020: No DVT  Echocardiogram 04/30/2020: 1. Left ventricular ejection fraction, by estimation, is 55 to 60%. The  left ventricle has normal function. The left ventricle has no regional  wall motion abnormalities. There is moderate concentric left ventricular   hypertrophy. Left ventricular  diastolic parameters are consistent with Grade I diastolic dysfunction  (impaired relaxation). Elevated left ventricular end-diastolic pressure.  2. Right ventricular systolic function is normal. The right ventricular  size is normal.  3. The mitral valve is normal in structure. Trivial mitral valve  regurgitation. No evidence of mitral stenosis.  4. The aortic valve is normal in structure. Aortic valve regurgitation is  not visualized. No aortic stenosis is present.  5. The inferior vena cava is dilated in size with <50% respiratory  variability, suggesting right atrial pressure of 15 mmHg.    Recent labs: 10/17/2020: Glucose 299, BUN/Cr 14/1.46. EGFR 46. Na/K 135/4.4. Albumin 3.1. Rest of the CMP normal H/H 9/30. MCV 72. Platelets 267 HbA1C 9.5%  10/07/2020: H/H 11/36. MCV 70. Platelets 390 WBC 13k  09/20/2020: Glucose 356, BUN/Cr 16/1.25. EGFR 61.  Na/K 137/4.7. Rest of the CMP normal H/H 8/29. MCV 74. Platelets 338 BNP 95  07/26/2020: Glucose 160, BUN/Cr 10/1.13. EGFR 60. Na/K 140/4.0. Rest of the CMP normal H/H 9.5/34.7. MCV 70. Platelets 311 HbA1C 9.2% Chol 135, TG 166, HDL 29, LDL 77   Review of Systems  Cardiovascular: Positive for chest pain, dyspnea on exertion, leg swelling and orthopnea. Negative for palpitations and syncope.         There were no vitals filed for this visit.   There is no height or weight on file to calculate BMI. There were no vitals filed for this visit.   Objective:   Physical Exam Vitals and nursing note reviewed.  Constitutional:      General: She is in acute distress.  Neck:     Vascular: No JVD.  Cardiovascular:     Rate and Rhythm: Regular rhythm. Tachycardia present.     Pulses: Normal pulses.     Heart sounds: Normal heart sounds. No murmur heard.   Pulmonary:     Effort: Pulmonary effort is normal.     Breath sounds: Normal breath sounds. No wheezing or rales.  Musculoskeletal:      Right lower leg: No edema.     Left lower leg: No edema.         Assessment & Recommendations:   45 year old African-American female with hypertension, type 2 diabetes mellitus, HFpEF, COPD, tobacco dependence, morbid obesity, microcytic anemia, uterine dysfunction, recurrent syncope  Exertional chest pain, dyspnea: Multiple risk factors for CAD. Multiple possible etiologies, including hypertension, anemia, obesity.  Will obtain lexiscan nuclear stress test. Will need to be conservative about any invasive management recommendations given her underlying anemia. Recommend management as per PCP/OBGYN Continue current anti anginal therapy, including metoprolol succinate 200 mg daily.  Chronic heart failure with preserved ejection fraction (HCC) Currently euvolumic Likely related to hypertension, obesity, controlled type 2 diabetes mellitus.  Other differential for her dyspnea is microcytic anemia.  Defer management to PCP.  Continue spironolactone 50 mg daily. Reduce salt intake to <2 g.  Hypertension Now better controlled.   Controlled type 2 diabetes mellitus: Recent DKA. Needs aggressive management. Defer to PCP.  F/u after stress test   Nigel Mormon, MD Pager: [phone] Office: [phone]

## 2020-12-21 ENCOUNTER — Institutional Professional Consult (permissible substitution): Payer: Medicaid Other | Admitting: Pulmonary Disease

## 2020-12-22 ENCOUNTER — Other Ambulatory Visit: Payer: Self-pay | Admitting: Cardiology

## 2020-12-22 DIAGNOSIS — I5032 Chronic diastolic (congestive) heart failure: Secondary | ICD-10-CM

## 2020-12-23 ENCOUNTER — Encounter (HOSPITAL_COMMUNITY): Payer: Self-pay

## 2020-12-23 ENCOUNTER — Emergency Department (HOSPITAL_COMMUNITY): Payer: Medicaid Other

## 2020-12-23 ENCOUNTER — Other Ambulatory Visit: Payer: Self-pay

## 2020-12-23 ENCOUNTER — Telehealth: Payer: Self-pay | Admitting: Physical Medicine and Rehabilitation

## 2020-12-23 ENCOUNTER — Inpatient Hospital Stay (HOSPITAL_COMMUNITY)
Admission: EM | Admit: 2020-12-23 | Discharge: 2020-12-26 | DRG: 074 | Disposition: A | Discharge status: Home / Self Care | Payer: Medicaid Other | Attending: Internal Medicine | Admitting: Internal Medicine

## 2020-12-23 DIAGNOSIS — D573 Sickle-cell trait: Secondary | ICD-10-CM | POA: Diagnosis present

## 2020-12-23 DIAGNOSIS — E1143 Type 2 diabetes mellitus with diabetic autonomic (poly)neuropathy: Principal | ICD-10-CM | POA: Diagnosis present

## 2020-12-23 DIAGNOSIS — M19041 Primary osteoarthritis, right hand: Secondary | ICD-10-CM | POA: Diagnosis present

## 2020-12-23 DIAGNOSIS — J449 Chronic obstructive pulmonary disease, unspecified: Secondary | ICD-10-CM | POA: Diagnosis present

## 2020-12-23 DIAGNOSIS — Z791 Long term (current) use of non-steroidal anti-inflammatories (NSAID): Secondary | ICD-10-CM

## 2020-12-23 DIAGNOSIS — R109 Unspecified abdominal pain: Secondary | ICD-10-CM | POA: Diagnosis not present

## 2020-12-23 DIAGNOSIS — F32A Depression, unspecified: Secondary | ICD-10-CM | POA: Diagnosis present

## 2020-12-23 DIAGNOSIS — E1122 Type 2 diabetes mellitus with diabetic chronic kidney disease: Secondary | ICD-10-CM | POA: Diagnosis present

## 2020-12-23 DIAGNOSIS — R1084 Generalized abdominal pain: Secondary | ICD-10-CM

## 2020-12-23 DIAGNOSIS — I5032 Chronic diastolic (congestive) heart failure: Secondary | ICD-10-CM | POA: Diagnosis present

## 2020-12-23 DIAGNOSIS — R0602 Shortness of breath: Secondary | ICD-10-CM | POA: Diagnosis present

## 2020-12-23 DIAGNOSIS — M4856XA Collapsed vertebra, not elsewhere classified, lumbar region, initial encounter for fracture: Secondary | ICD-10-CM | POA: Diagnosis present

## 2020-12-23 DIAGNOSIS — K219 Gastro-esophageal reflux disease without esophagitis: Secondary | ICD-10-CM | POA: Diagnosis present

## 2020-12-23 DIAGNOSIS — D631 Anemia in chronic kidney disease: Secondary | ICD-10-CM | POA: Diagnosis present

## 2020-12-23 DIAGNOSIS — M19042 Primary osteoarthritis, left hand: Secondary | ICD-10-CM | POA: Diagnosis present

## 2020-12-23 DIAGNOSIS — E119 Type 2 diabetes mellitus without complications: Secondary | ICD-10-CM

## 2020-12-23 DIAGNOSIS — M170 Bilateral primary osteoarthritis of knee: Secondary | ICD-10-CM | POA: Diagnosis present

## 2020-12-23 DIAGNOSIS — N1831 Chronic kidney disease, stage 3a: Secondary | ICD-10-CM | POA: Diagnosis present

## 2020-12-23 DIAGNOSIS — Z794 Long term (current) use of insulin: Secondary | ICD-10-CM

## 2020-12-23 DIAGNOSIS — Z79899 Other long term (current) drug therapy: Secondary | ICD-10-CM

## 2020-12-23 DIAGNOSIS — I10 Essential (primary) hypertension: Secondary | ICD-10-CM | POA: Diagnosis present

## 2020-12-23 DIAGNOSIS — Z886 Allergy status to analgesic agent status: Secondary | ICD-10-CM

## 2020-12-23 DIAGNOSIS — Z20822 Contact with and (suspected) exposure to covid-19: Secondary | ICD-10-CM | POA: Diagnosis present

## 2020-12-23 DIAGNOSIS — K3184 Gastroparesis: Secondary | ICD-10-CM | POA: Diagnosis present

## 2020-12-23 DIAGNOSIS — Z833 Family history of diabetes mellitus: Secondary | ICD-10-CM

## 2020-12-23 DIAGNOSIS — R1111 Vomiting without nausea: Secondary | ICD-10-CM | POA: Diagnosis not present

## 2020-12-23 DIAGNOSIS — Z882 Allergy status to sulfonamides status: Secondary | ICD-10-CM

## 2020-12-23 DIAGNOSIS — Z8249 Family history of ischemic heart disease and other diseases of the circulatory system: Secondary | ICD-10-CM

## 2020-12-23 DIAGNOSIS — Z885 Allergy status to narcotic agent status: Secondary | ICD-10-CM

## 2020-12-23 DIAGNOSIS — Z8616 Personal history of COVID-19: Secondary | ICD-10-CM

## 2020-12-23 DIAGNOSIS — E1142 Type 2 diabetes mellitus with diabetic polyneuropathy: Secondary | ICD-10-CM | POA: Diagnosis present

## 2020-12-23 DIAGNOSIS — D72829 Elevated white blood cell count, unspecified: Secondary | ICD-10-CM | POA: Diagnosis present

## 2020-12-23 DIAGNOSIS — Z7951 Long term (current) use of inhaled steroids: Secondary | ICD-10-CM

## 2020-12-23 DIAGNOSIS — I130 Hypertensive heart and chronic kidney disease with heart failure and stage 1 through stage 4 chronic kidney disease, or unspecified chronic kidney disease: Secondary | ICD-10-CM | POA: Diagnosis present

## 2020-12-23 DIAGNOSIS — Z87891 Personal history of nicotine dependence: Secondary | ICD-10-CM

## 2020-12-23 DIAGNOSIS — G4733 Obstructive sleep apnea (adult) (pediatric): Secondary | ICD-10-CM | POA: Diagnosis present

## 2020-12-23 DIAGNOSIS — Z6841 Body Mass Index (BMI) 40.0 and over, adult: Secondary | ICD-10-CM

## 2020-12-23 DIAGNOSIS — R569 Unspecified convulsions: Secondary | ICD-10-CM | POA: Diagnosis present

## 2020-12-23 DIAGNOSIS — M4854XA Collapsed vertebra, not elsewhere classified, thoracic region, initial encounter for fracture: Secondary | ICD-10-CM | POA: Diagnosis present

## 2020-12-23 DIAGNOSIS — Z888 Allergy status to other drugs, medicaments and biological substances status: Secondary | ICD-10-CM

## 2020-12-23 DIAGNOSIS — E559 Vitamin D deficiency, unspecified: Secondary | ICD-10-CM | POA: Diagnosis present

## 2020-12-23 LAB — URINALYSIS, ROUTINE W REFLEX MICROSCOPIC
Bacteria, UA: NONE SEEN
Bilirubin Urine: NEGATIVE
Glucose, UA: 50 mg/dL — AB
Ketones, ur: NEGATIVE mg/dL
Leukocytes,Ua: NEGATIVE
Nitrite: NEGATIVE
Protein, ur: 30 mg/dL — AB
Specific Gravity, Urine: 1.01 (ref 1.005–1.030)
pH: 5 (ref 5.0–8.0)

## 2020-12-23 LAB — CBC WITH DIFFERENTIAL/PLATELET
Abs Immature Granulocytes: 0.04 10*3/uL (ref 0.00–0.07)
Basophils Absolute: 0.1 10*3/uL (ref 0.0–0.1)
Basophils Relative: 1 %
Eosinophils Absolute: 0 10*3/uL (ref 0.0–0.5)
Eosinophils Relative: 0 %
HCT: 32.5 % — ABNORMAL LOW (ref 36.0–46.0)
Hemoglobin: 9.9 g/dL — ABNORMAL LOW (ref 12.0–15.0)
Immature Granulocytes: 0 %
Lymphocytes Relative: 16 %
Lymphs Abs: 2.1 10*3/uL (ref 0.7–4.0)
MCH: 22.7 pg — ABNORMAL LOW (ref 26.0–34.0)
MCHC: 30.5 g/dL (ref 30.0–36.0)
MCV: 74.5 fL — ABNORMAL LOW (ref 80.0–100.0)
Monocytes Absolute: 0.6 10*3/uL (ref 0.1–1.0)
Monocytes Relative: 5 %
Neutro Abs: 10.2 10*3/uL — ABNORMAL HIGH (ref 1.7–7.7)
Neutrophils Relative %: 78 %
Platelets: 386 10*3/uL (ref 150–400)
RBC: 4.36 MIL/uL (ref 3.87–5.11)
RDW: 25 % — ABNORMAL HIGH (ref 11.5–15.5)
WBC: 13.1 10*3/uL — ABNORMAL HIGH (ref 4.0–10.5)
nRBC: 0 % (ref 0.0–0.2)

## 2020-12-23 LAB — COMPREHENSIVE METABOLIC PANEL
ALT: 10 U/L (ref 0–44)
AST: 18 U/L (ref 15–41)
Albumin: 4 g/dL (ref 3.5–5.0)
Alkaline Phosphatase: 104 U/L (ref 38–126)
Anion gap: 12 (ref 5–15)
BUN: 8 mg/dL (ref 6–20)
CO2: 22 mmol/L (ref 22–32)
Calcium: 9.7 mg/dL (ref 8.9–10.3)
Chloride: 104 mmol/L (ref 98–111)
Creatinine, Ser: 1.08 mg/dL — ABNORMAL HIGH (ref 0.44–1.00)
GFR, Estimated: >60 mL/min (ref >60)
Glucose, Bld: 199 mg/dL — ABNORMAL HIGH (ref 70–99)
Potassium: 3.8 mmol/L (ref 3.5–5.1)
Sodium: 138 mmol/L (ref 135–145)
Total Bilirubin: 0.8 mg/dL (ref 0.3–1.2)
Total Protein: 8.1 g/dL (ref 6.5–8.1)

## 2020-12-23 LAB — I-STAT BETA HCG BLOOD, ED (MC, WL, AP ONLY): I-stat hCG, quantitative: <5 m[IU]/mL (ref <5)

## 2020-12-23 LAB — LIPASE, BLOOD: Lipase: 23 U/L (ref 11–51)

## 2020-12-23 MED ORDER — ONDANSETRON 4 MG PO TBDP: 4.0000 mg | ORAL_TABLET | Freq: Three times a day (TID) | ORAL | 0 refills | Status: DC | PRN

## 2020-12-23 MED ORDER — DROPERIDOL 2.5 MG/ML IJ SOLN
1.2500 mg | Freq: Once | INTRAMUSCULAR | Status: AC
  Administered 2020-12-23: 1.25 mg via INTRAVENOUS
  Filled 2020-12-23: qty 2

## 2020-12-23 MED ORDER — ONDANSETRON HCL 4 MG/2ML IJ SOLN
4.0000 mg | Freq: Once | INTRAMUSCULAR | Status: AC
  Administered 2020-12-23: 4 mg via INTRAVENOUS
  Filled 2020-12-23: qty 2

## 2020-12-23 MED ORDER — IOHEXOL 300 MG/ML  SOLN
100.0000 mL | Freq: Once | INTRAMUSCULAR | Status: AC | PRN
  Administered 2020-12-23: 100 mL via INTRAVENOUS

## 2020-12-23 MED ORDER — FENTANYL CITRATE (PF) 100 MCG/2ML IJ SOLN: 50.0000 ug | Freq: Once | INTRAMUSCULAR | Status: DC

## 2020-12-23 MED ORDER — FENTANYL CITRATE (PF) 100 MCG/2ML IJ SOLN
100.0000 ug | Freq: Once | INTRAMUSCULAR | Status: AC
  Administered 2020-12-23: 100 ug via INTRAVENOUS
  Filled 2020-12-23: qty 2

## 2020-12-23 MED ORDER — HYDRALAZINE HCL 25 MG PO TABS
50.0000 mg | ORAL_TABLET | Freq: Once | ORAL | Status: AC
  Administered 2020-12-23: 50 mg via ORAL
  Filled 2020-12-23: qty 2

## 2020-12-23 MED ORDER — HYDROMORPHONE HCL 1 MG/ML IJ SOLN
1.0000 mg | Freq: Once | INTRAMUSCULAR | Status: AC
Start: 2020-12-23 — End: 2020-12-23
  Administered 2020-12-23: 1 mg via INTRAVENOUS
  Filled 2020-12-23: qty 1

## 2020-12-23 NOTE — Telephone Encounter (Signed)
Patient states she would like to get something for pain, is on [facility] wait list if there is a cancellation prior to her scheduled apt

## 2020-12-23 NOTE — ED Provider Notes (Signed)
[facility] DEPT Provider Note   CSN: [id number] Arrival date & time: 12/23/20  1720     History Chief Complaint  Patient presents with  . Abdominal Pain    Jill Shaw is a 45 y.o. female.  Presenting to ER with concern for abdominal pain.  Patient reports pain started last night, ongoing today.  Pain is generalized, not in one particular location.  Severe, stabbing, no alleviating factors.  Has not tried taking any medication for this.  Has had associated nausea but no vomiting.  No diarrhea or constipation.  Reports she has had similar episodes.  Extensive medical history including CHF, COPD, diabetes, gastric reflux  HPI     Past Medical History:  Diagnosis Date  . Anemia   . Arthritis    knees, hands  . Asthma   . Chronic diastolic (congestive) heart failure ([place])   . COPD (chronic obstructive pulmonary disease) ([place])   . Diabetes mellitus without complication ([place])    type 2  . Dysfunctional uterine bleeding   . GERD (gastroesophageal reflux disease)   . Hypertension   . Neuromuscular disorder (HCC)    neuropathy feet  . Seizures ([place]) 09/12/2017   pt states r/t stress and blood sugar - no meds last one 4 months ago, not seen neurologist  . Sickle cell trait ([place])   . Smoker   . Vitamin D deficiency 10/2019  . Wears glasses     Patient Active Problem List   Diagnosis Date Noted  . DKA (diabetic ketoacidosis) ([place]) 10/15/2020  . Hyperkalemia 10/15/2020  . Severe sepsis ([place]) 10/15/2020  . Acute metabolic encephalopathy 92/44/6286  . Sinus tachycardia 10/14/2020  . Precordial pain 10/14/2020  . Abnormal findings on diagnostic imaging of lung 08/19/2020  . Physical deconditioning 08/19/2020  . History of endometrial ablation 07/27/2020  . History of alcohol use 07/20/2020  . Current moderate episode of major depressive disorder without prior episode ([place]) 07/20/2020  . Sickle cell trait ([place]) 07/19/2020  . Elevated  d-dimer 07/13/2020  . Healthcare maintenance 07/12/2020  . Exertional dyspnea 07/12/2020  . Atypical chest pain 07/12/2020  . At risk for obstructive sleep apnea 07/12/2020  . DKA (diabetic ketoacidoses) 07/02/2020  . Acute on chronic diastolic (congestive) heart failure ([place]) 06/22/2020  . Abdominal pain 03/08/2020  . Chronic diastolic CHF (congestive heart failure) ([place]) 03/08/2020  . AKI (acute kidney injury) ([place]) 01/29/2020  . Symptomatic anemia 12/19/2019  . Elevated sed rate 11/26/2019  . Elevated C-reactive protein (CRP) 11/26/2019  . Hypoglycemia 02/07/2019  . Hypotension 02/07/2019  . Lactic acidosis 02/07/2019  . Right ankle pain 02/07/2019  . Chronic obstructive pulmonary disease ([place]) 02/05/2019  . Seizure ([place]) 07/31/2018  . Vitamin D deficiency 05/31/2017  . Gastroesophageal reflux disease 05/24/2017  . Abnormal uterine bleeding (AUB) 05/17/2017  . Anemia of chronic disease 04/06/2017  . Knee pain, chronic 03/27/2016  . Controlled type 2 diabetes mellitus without complication, with long-term current use of insulin ([place]) 03/27/2016  . Essential hypertension 03/27/2016  . Morbid obesity ([place]) 03/27/2016  . Irritable bowel syndrome with constipation 03/27/2016  . Tobacco dependence 03/27/2016  . CKD (chronic kidney disease) stage 2, GFR 60-89 ml/min 03/25/2016  . Arthropathy, lower leg 05/04/2013  . CTS (carpal tunnel syndrome) 05/04/2013  . Peripheral edema 05/04/2013  . Chronic pain 05/13/2012  . Diabetic gastroparesis ([place]) 11/06/2006  . Hot flashes 11/06/2006    Past Surgical History:  Procedure Laterality Date  . CESAREAN SECTION  x 1. for twins  . DILATION AND CURETTAGE OF UTERUS N/A 08/20/2019   Procedure: DILATATION AND CURETTAGE;  Surgeon: Emily Filbert, MD;  Location: [facility];  Service: Gynecology;  Laterality: N/A;  . ENDOMETRIAL ABLATION N/A 08/20/2019   Procedure: [place] Ablation;  Surgeon: Emily Filbert, MD;  Location: [facility];  Service: Gynecology;   Laterality: N/A;  . EYE SURGERY Bilateral    laser right and cataract removed left eye  . RADIOLOGY WITH ANESTHESIA N/A 09/16/2019   Procedure: MRI WITH ANESTHESIA   L SPINE WITHOUT CONTRAST, T SPINE WITHOUT CONTRAST , CERVICAL WITHOUT CONTRAST;  Surgeon: Radiologist, Medication, MD;  Location: [facility];  Service: Radiology;  Laterality: N/A;  . TUBAL LIGATION     interval BTL  . UPPER GI ENDOSCOPY  07/2017     OB History    Gravida  3   Para      Term      Preterm      AB      Living  3     SAB      IAB      Ectopic      Multiple      Live Births  3        Obstetric Comments  Vaginal x 1 and then c-section for twins        Family History  Problem Relation Age of Onset  . Diabetes Mother   . Hypertension Mother     Social History   Tobacco Use  . Smoking status: Former Smoker    Packs/day: 0.25    Years: 26.00    Pack years: 6.50    Types: Cigarettes    Quit date: 09/13/2020    Years since quitting: 0.2  . Smokeless tobacco: Never Used  . Tobacco comment: 4-5 cigarettes/day  Vaping Use  . Vaping Use: Never used  Substance Use Topics  . Alcohol use: No  . Drug use: No    Home Medications Prior to Admission medications   Medication Sig Start Date End Date Taking? Authorizing Provider  ondansetron (ZOFRAN ODT) 4 MG disintegrating tablet Take 1 tablet (4 mg total) by mouth every 8 (eight) hours as needed for nausea or vomiting. 12/23/20  Yes Dykstra, Ellwood Dense, MD  ACCU-CHEK GUIDE test strip USE AS DIRECTED UP TO FOUR TIMES DAILY 11/11/20   Vevelyn Francois, NP  albuterol (PROVENTIL) (2.5 MG/3ML) 0.083% nebulizer solution Take 3 mLs (2.5 mg total) by nebulization every 6 (six) hours as needed for wheezing or shortness of breath. 09/27/20   Icard, Octavio Graves, DO  albuterol (VENTOLIN HFA) 108 (90 Base) MCG/ACT inhaler Inhale 2 puffs into the lungs every 6 (six) hours as needed for wheezing or shortness of breath. 09/27/20   June Leap L, DO  blood  glucose meter kit and supplies KIT 1 each by Other route See admin instructions. Dispense based on patient and insurance preference. Use up to four times daily as directed. (FOR ICD-9 250.00, 250.01). 09/02/20   Vevelyn Francois, NP  budesonide-formoterol (SYMBICORT) 160-4.5 MCG/ACT inhaler INHALE 2 PUFFS INTO THE LUNGS 2 (TWO) TIMES DAILY. Patient taking differently: Inhale 2 puffs into the lungs 2 (two) times daily. 09/27/20   Icard, Octavio Graves, DO  buPROPion (WELLBUTRIN SR) 150 MG 12 hr tablet Take 1 tablet (150 mg total) by mouth 2 (two) times daily. 11/23/20 11/23/21  [name], MD  diclofenac Sodium (VOLTAREN) 1 % GEL Apply 4 g topically 4 (four) times daily  as needed (pain). 08/15/20   [provider]  ferrous sulfate 325 (65 FE) MG tablet Take 1 tablet (325 mg total) by mouth 3 (three) times daily with meals. 12/02/20 12/02/21  Vevelyn Francois, NP  fluticasone (FLONASE) 50 MCG/ACT nasal spray Place 2 sprays into both nostrils daily. 12/02/20   Vevelyn Francois, NP  furosemide (LASIX) 40 MG tablet Take 1 tablet (40 mg total) by mouth 2 (two) times daily. 08/11/20   Lorretta Harp, MD  Glucosamine Sulfate 1000 MG CAPS Take 1 capsule (1,000 mg total) by mouth 2 (two) times daily. Patient taking differently: Take 1,000 mg by mouth 2 (two) times daily. 08/22/19   Hilts, Legrand Como, MD  guaiFENesin-dextromethorphan (ROBITUSSIN DM) 100-10 MG/5ML syrup Take 10 mLs by mouth every 4 (four) hours as needed for cough. 11/23/20   [name], MD  hydrALAZINE (APRESOLINE) 50 MG tablet Take 50 mg by mouth 3 (three) times daily. 12/02/20   [provider]  hydrocortisone 2.5 % cream Apply topically 2 (two) times daily. Patient taking differently: Apply 1 application topically 2 (two) times daily. 09/22/20   Vevelyn Francois, NP  hydroquinone 4 % cream APPLY TOPICALLY TWICE DAILY Patient taking differently: Apply 1 application topically 2 (two) times daily. 09/13/20   Vevelyn Francois, NP  Insulin  Lispro Prot & Lispro (HUMALOG MIX 75/25 KWIKPEN) (75-25) 100 UNIT/ML Kwikpen 110 units every 12 hours 12/02/20   Vevelyn Francois, NP  Insulin Pen Needle (PEN NEEDLES) 30G X 5 MM MISC 1 Units by Does not apply route as directed. 12/02/19   Vevelyn Francois, NP  lidocaine (XYLOCAINE) 5 % ointment Apply 1 application topically as needed. Patient taking differently: Apply 1 application topically as needed for mild pain. 09/16/20   Patel, Arvin Collard K, DO  losartan (COZAAR) 50 MG tablet Take 1 tablet (50 mg total) by mouth daily. TAKE 1 TABLET(50MG TOTAL) BY MOUTH EVERY DAY 12/02/20 12/02/21  Vevelyn Francois, NP  meloxicam (MOBIC) 7.5 MG tablet Take 1 tablet (7.5 mg total) by mouth daily. 12/06/20 01/05/21  Vevelyn Francois, NP  metoprolol succinate (TOPROL-XL) 200 MG 24 hr tablet Take 1 tablet (200 mg total) by mouth daily. Take with or immediately following a meal. Patient taking differently: Take 200 mg by mouth daily. 10/14/20   Patwardhan, Reynold Bowen, MD  nicotine (NICODERM CQ) 21 mg/24hr patch Place 1 patch (21 mg total) onto the skin daily. 07/12/20   Lauraine Rinne, NP  norethindrone (AYGESTIN) 5 MG tablet Take 2 tablets (10 mg total) by mouth in the morning, at noon, in the evening, and at bedtime. With bleeding 10/19/20   Aletha Halim, MD  pantoprazole (PROTONIX) 40 MG tablet Take 1 tablet (40 mg total) by mouth daily. 12/02/20 12/02/21  Vevelyn Francois, NP  potassium chloride SA (KLOR-CON) 20 MEQ tablet Take 20 mEq by mouth daily. 11/16/20   [provider]  rOPINIRole (REQUIP) 0.25 MG tablet Take 1 tablet (0.25 mg total) by mouth 3 (three) times daily. 11/19/20 11/19/21  Izora Ribas, MD  rosuvastatin (CRESTOR) 5 MG tablet Take 1 tablet (5 mg total) by mouth daily. 08/11/20   Lorretta Harp, MD  saxagliptin HCl (ONGLYZA) 5 MG TABS tablet Take 1 tablet (5 mg total) by mouth daily. 12/02/20 12/02/21  Vevelyn Francois, NP  spironolactone (ALDACTONE) 50 MG tablet TAKE 1 TABLET(50 MG) BY MOUTH DAILY 12/22/20    Patwardhan, Reynold Bowen, MD  Tiotropium Bromide Monohydrate (SPIRIVA RESPIMAT) 2.5 MCG/ACT  AERS Inhale 2 puffs into the lungs daily. 09/27/20   Icard, Octavio Graves, DO  tiZANidine (ZANAFLEX) 4 MG capsule Take 1 capsule (4 mg total) by mouth 3 (three) times daily. Do not drive while taking this medication. 12/02/20 12/02/21  Vevelyn Francois, NP  Turmeric 500 MG CAPS Take 500 mg by mouth 2 (two) times daily. 08/22/19   Hilts, Legrand Como, MD  Vitamin D, Ergocalciferol, (DRISDOL) 1.25 MG (50000 UNIT) CAPS capsule Take 1 capsule (50,000 Units total) by mouth every 7 (seven) days. Patient taking differently: Take 50,000 Units by mouth every Friday. 12/02/19   Vevelyn Francois, NP    Allergies    Ketoprofen, Aspirin, Gabapentin, Ibuprofen, Liraglutide, Naproxen, Omeprazole-sodium bicarbonate, Sulfa antibiotics, and Tramadol  Review of Systems   Review of Systems  Constitutional: Negative for chills and fever.  HENT: Negative for ear pain and sore throat.   Eyes: Negative for pain and visual disturbance.  Respiratory: Negative for cough and shortness of breath.   Cardiovascular: Negative for chest pain and palpitations.  Gastrointestinal: Positive for abdominal pain and nausea. Negative for vomiting.  Genitourinary: Negative for dysuria and hematuria.  Musculoskeletal: Negative for arthralgias and back pain.  Skin: Negative for color change and rash.  Neurological: Negative for seizures and syncope.  All other systems reviewed and are negative.   Physical Exam Updated Vital Signs BP (!) 168/124   Pulse 91   Temp 98.6 F (37 C) (Oral)   Resp 18   Ht _0  (1.626 m)   Wt 122.5 kg   SpO2 99%   BMI 46.35 kg/m   Physical Exam Vitals and nursing note reviewed.  Constitutional:      General: She is not in acute distress.    Appearance: She is well-developed.  HENT:     Head: Normocephalic and atraumatic.  Eyes:     Conjunctiva/sclera: Conjunctivae normal.  Cardiovascular:     Rate and Rhythm:  Normal rate and regular rhythm.     Heart sounds: No murmur heard.   Pulmonary:     Effort: Pulmonary effort is normal. No respiratory distress.     Breath sounds: Normal breath sounds.  Abdominal:     Palpations: Abdomen is soft.     Tenderness: There is no abdominal tenderness.     Comments: There is no rebound or guarding but there is generalized tenderness to palpation  Musculoskeletal:     Cervical back: Neck supple.  Skin:    General: Skin is warm and dry.  Neurological:     Mental Status: She is alert.     ED Results / Procedures / Treatments   Labs (all labs ordered are listed, but only abnormal results are displayed) Labs Reviewed  CBC WITH DIFFERENTIAL/PLATELET - Abnormal; Notable for the following components:      Result Value   WBC 13.1 (*)    Hemoglobin 9.9 (*)    HCT 32.5 (*)    MCV 74.5 (*)    MCH 22.7 (*)    RDW 25.0 (*)    Neutro Abs 10.2 (*)    All other components within normal limits  COMPREHENSIVE METABOLIC PANEL - Abnormal; Notable for the following components:   Glucose, Bld 199 (*)    Creatinine, Ser 1.08 (*)    All other components within normal limits  URINALYSIS, ROUTINE W REFLEX MICROSCOPIC - Abnormal; Notable for the following components:   Glucose, UA 50 (*)    Hgb urine dipstick MODERATE (*)    Protein,  ur 30 (*)    All other components within normal limits  LIPASE, BLOOD  I-STAT BETA HCG BLOOD, ED ([facility], [facility], [facility] ONLY)    EKG None  Radiology CT ABDOMEN PELVIS W CONTRAST  Result Date: 12/23/2020 CLINICAL DATA:  Acute nonlocalized abdominal pain. EXAM: CT ABDOMEN AND PELVIS WITH CONTRAST TECHNIQUE: Multidetector CT imaging of the abdomen and pelvis was performed using the standard protocol following bolus administration of intravenous contrast. CONTRAST:  129m OMNIPAQUE IOHEXOL 300 MG/ML  SOLN COMPARISON:  10/16/2020.  Other scans as distant as 01/31/2020 FINDINGS: Lower chest: Mild chronic scarring and or atelectasis at the lung bases.  Hepatobiliary: Chronic areas of geographic fatty change or fibrosis within the liver. No acute liver finding. No calcified gallstones. Pancreas: Normal Spleen: Normal Adrenals/Urinary Tract: Adrenal glands are normal. Kidneys are normal. No cyst, mass, stone or hydronephrosis. Stomach/Bowel: No bowel pathology. Stomach and small intestine are normal. Appendix is normal. No acute colon pathology. Vascular/Lymphatic: Normal Reproductive: Chronic uterine fibroid.  No adnexal mass. Other: No free fluid or air. Musculoskeletal: Normal IMPRESSION: 1. No acute finding by CT. Chronic areas of geographic fatty change or fibrosis within the liver. 2. Chronic uterine fibroid. Electronically Signed   By: MNelson ChimesM.D.   On: 12/23/2020 20:39    Procedures Procedures   Medications Ordered in ED Medications  HYDROmorphone (DILAUDID) injection 1 mg (1 mg Intravenous Given 12/23/20 1824)  ondansetron (ZOFRAN) injection 4 mg (4 mg Intravenous Given 12/23/20 1821)  hydrALAZINE (APRESOLINE) tablet 50 mg (50 mg Oral Given 12/23/20 2045)  iohexol (OMNIPAQUE) 300 MG/ML solution 100 mL (100 mLs Intravenous Contrast Given 12/23/20 2013)  droperidol (INAPSINE) 2.5 MG/ML injection 1.25 mg (1.25 mg Intravenous Given 12/23/20 2230)  fentaNYL (SUBLIMAZE) injection 100 mcg (100 mcg Intravenous Given 12/23/20 2230)    ED Course  I have reviewed the triage vital signs and the nursing notes.  Pertinent labs & imaging results that were available during my care of the patient were reviewed by me and considered in my medical decision making (see chart for details).    MDM Rules/Calculators/A&P                         45year old lady presents to ER with concern for generalized abdominal pain.  Her lab work is all grossly stable.  Given the tenderness on exam, check CT.  CT scan negative for acute abdominal pelvic pathology.  Patient still having significant pain and nausea after initial medications.  Provided additional meds.     While awaiting p.o. challenge, signed out to Dr. RWyvonnia Dusky  If patient tolerating p.o., and symptoms controlled, anticipate discharge.   Final Clinical Impression(s) / ED Diagnoses Final diagnoses:  Generalized abdominal pain    Rx / DC Orders ED Discharge Orders         Ordered    ondansetron (ZOFRAN ODT) 4 MG disintegrating tablet  Every 8 hours PRN        12/23/20 2338           DLucrezia Starch MD 12/24/20 0000

## 2020-12-23 NOTE — Telephone Encounter (Signed)
Thanks Renae! I told her when I previously called her we can discuss the pros and cons of different options at her follow-up appointment

## 2020-12-23 NOTE — ED Triage Notes (Signed)
Per EMS-complaining of abdominal pain that started last night progressively getting worse

## 2020-12-24 ENCOUNTER — Encounter (HOSPITAL_COMMUNITY): Payer: Self-pay | Admitting: Internal Medicine

## 2020-12-24 ENCOUNTER — Observation Stay (HOSPITAL_COMMUNITY): Payer: Medicaid Other

## 2020-12-24 ENCOUNTER — Emergency Department (HOSPITAL_COMMUNITY): Payer: Medicaid Other

## 2020-12-24 DIAGNOSIS — Z794 Long term (current) use of insulin: Secondary | ICD-10-CM | POA: Diagnosis not present

## 2020-12-24 DIAGNOSIS — K3184 Gastroparesis: Secondary | ICD-10-CM | POA: Diagnosis not present

## 2020-12-24 DIAGNOSIS — R079 Chest pain, unspecified: Secondary | ICD-10-CM | POA: Diagnosis not present

## 2020-12-24 DIAGNOSIS — R000 Tachycardia, unspecified: Secondary | ICD-10-CM | POA: Diagnosis not present

## 2020-12-24 DIAGNOSIS — E119 Type 2 diabetes mellitus without complications: Secondary | ICD-10-CM | POA: Diagnosis not present

## 2020-12-24 DIAGNOSIS — S22080A Wedge compression fracture of T11-T12 vertebra, initial encounter for closed fracture: Secondary | ICD-10-CM | POA: Diagnosis not present

## 2020-12-24 DIAGNOSIS — R1084 Generalized abdominal pain: Secondary | ICD-10-CM | POA: Diagnosis not present

## 2020-12-24 DIAGNOSIS — R0602 Shortness of breath: Secondary | ICD-10-CM | POA: Diagnosis present

## 2020-12-24 DIAGNOSIS — I517 Cardiomegaly: Secondary | ICD-10-CM | POA: Diagnosis not present

## 2020-12-24 DIAGNOSIS — E1143 Type 2 diabetes mellitus with diabetic autonomic (poly)neuropathy: Secondary | ICD-10-CM | POA: Diagnosis not present

## 2020-12-24 DIAGNOSIS — R911 Solitary pulmonary nodule: Secondary | ICD-10-CM | POA: Diagnosis not present

## 2020-12-24 DIAGNOSIS — I5032 Chronic diastolic (congestive) heart failure: Secondary | ICD-10-CM

## 2020-12-24 DIAGNOSIS — J9811 Atelectasis: Secondary | ICD-10-CM | POA: Diagnosis not present

## 2020-12-24 DIAGNOSIS — I10 Essential (primary) hypertension: Secondary | ICD-10-CM

## 2020-12-24 DIAGNOSIS — S32010A Wedge compression fracture of first lumbar vertebra, initial encounter for closed fracture: Secondary | ICD-10-CM | POA: Diagnosis not present

## 2020-12-24 DIAGNOSIS — J449 Chronic obstructive pulmonary disease, unspecified: Secondary | ICD-10-CM | POA: Diagnosis not present

## 2020-12-24 DIAGNOSIS — R109 Unspecified abdominal pain: Secondary | ICD-10-CM | POA: Diagnosis not present

## 2020-12-24 LAB — BLOOD GAS, ARTERIAL
Acid-base deficit: 2.6 mmol/L — ABNORMAL HIGH (ref 0.0–2.0)
Bicarbonate: 21.6 mmol/L (ref 20.0–28.0)
O2 Saturation: 66 %
Patient temperature: 98.6
pCO2 arterial: 37.3 mmHg (ref 32.0–48.0)
pH, Arterial: 7.381 (ref 7.350–7.450)
pO2, Arterial: 39.7 mmHg — CL (ref 83.0–108.0)

## 2020-12-24 LAB — RESP PANEL BY RT-PCR (FLU A&B, COVID) ARPGX2
Influenza A by PCR: NEGATIVE
Influenza B by PCR: NEGATIVE
SARS Coronavirus 2 by RT PCR: NEGATIVE

## 2020-12-24 LAB — D-DIMER, QUANTITATIVE: D-Dimer, Quant: 0.48 ug/mL-FEU (ref 0.00–0.50)

## 2020-12-24 LAB — TROPONIN I (HIGH SENSITIVITY)
Troponin I (High Sensitivity): 4 ng/L (ref <18)
Troponin I (High Sensitivity): 5 ng/L (ref <18)

## 2020-12-24 LAB — CBG MONITORING, ED
Glucose-Capillary: 296 mg/dL — ABNORMAL HIGH (ref 70–99)
Glucose-Capillary: 343 mg/dL — ABNORMAL HIGH (ref 70–99)

## 2020-12-24 LAB — GLUCOSE, CAPILLARY
Glucose-Capillary: 416 mg/dL — ABNORMAL HIGH (ref 70–99)
Glucose-Capillary: 95 mg/dL (ref 70–99)

## 2020-12-24 LAB — HEMOGLOBIN A1C
Hgb A1c MFr Bld: 9.7 % — ABNORMAL HIGH (ref 4.8–5.6)
Mean Plasma Glucose: 231.69 mg/dL

## 2020-12-24 MED ORDER — INSULIN ASPART 100 UNIT/ML ~~LOC~~ SOLN
20.0000 [IU] | Freq: Once | SUBCUTANEOUS | Status: AC
  Administered 2020-12-24: 20 [IU] via SUBCUTANEOUS

## 2020-12-24 MED ORDER — FUROSEMIDE 40 MG PO TABS
40.0000 mg | ORAL_TABLET | Freq: Two times a day (BID) | ORAL | Status: DC
  Administered 2020-12-24 – 2020-12-26 (×5): 40 mg via ORAL
  Filled 2020-12-24 (×5): qty 1

## 2020-12-24 MED ORDER — MORPHINE SULFATE (PF) 2 MG/ML IV SOLN
2.0000 mg | Freq: Once | INTRAVENOUS | Status: AC
Start: 2020-12-24 — End: 2020-12-24
  Administered 2020-12-24: 2 mg via INTRAVENOUS
  Filled 2020-12-24: qty 1

## 2020-12-24 MED ORDER — HYDRALAZINE HCL 50 MG PO TABS
50.0000 mg | ORAL_TABLET | Freq: Three times a day (TID) | ORAL | Status: DC
  Administered 2020-12-24 – 2020-12-26 (×5): 50 mg via ORAL
  Filled 2020-12-24 (×2): qty 1
  Filled 2020-12-24 (×2): qty 2
  Filled 2020-12-24 (×3): qty 1

## 2020-12-24 MED ORDER — INSULIN ASPART 100 UNIT/ML ~~LOC~~ SOLN
10.0000 [IU] | Freq: Three times a day (TID) | SUBCUTANEOUS | Status: DC
  Administered 2020-12-25 (×2): 10 [IU] via SUBCUTANEOUS

## 2020-12-24 MED ORDER — SODIUM CHLORIDE 0.9% FLUSH
3.0000 mL | Freq: Two times a day (BID) | INTRAVENOUS | Status: DC
  Administered 2020-12-24 – 2020-12-26 (×5): 3 mL via INTRAVENOUS

## 2020-12-24 MED ORDER — LABETALOL HCL 5 MG/ML IV SOLN
10.0000 mg | INTRAVENOUS | Status: DC | PRN
  Filled 2020-12-24 (×2): qty 4

## 2020-12-24 MED ORDER — LACTATED RINGERS IV SOLN: INTRAVENOUS | Status: AC

## 2020-12-24 MED ORDER — HYDRALAZINE HCL 20 MG/ML IJ SOLN
10.0000 mg | Freq: Once | INTRAMUSCULAR | Status: AC
  Administered 2020-12-24: 10 mg via INTRAVENOUS
  Filled 2020-12-24: qty 1

## 2020-12-24 MED ORDER — HYDROCODONE-ACETAMINOPHEN 5-325 MG PO TABS
1.0000 | ORAL_TABLET | ORAL | Status: DC | PRN
  Administered 2020-12-24 – 2020-12-26 (×5): 2 via ORAL
  Filled 2020-12-24 (×5): qty 2

## 2020-12-24 MED ORDER — HYDROCORTISONE 1 % EX CREA
1.0000 "application " | TOPICAL_CREAM | Freq: Three times a day (TID) | CUTANEOUS | Status: DC | PRN
  Administered 2020-12-25: 1 via TOPICAL
  Filled 2020-12-24: qty 28

## 2020-12-24 MED ORDER — METOCLOPRAMIDE HCL 5 MG/ML IJ SOLN
10.0000 mg | Freq: Once | INTRAMUSCULAR | Status: AC
  Administered 2020-12-24: 10 mg via INTRAVENOUS
  Filled 2020-12-24: qty 2

## 2020-12-24 MED ORDER — UMECLIDINIUM BROMIDE 62.5 MCG/INH IN AEPB
1.0000 | INHALATION_SPRAY | Freq: Every day | RESPIRATORY_TRACT | Status: DC
  Administered 2020-12-25 – 2020-12-26 (×2): 1 via RESPIRATORY_TRACT
  Filled 2020-12-24: qty 7

## 2020-12-24 MED ORDER — TRAZODONE HCL 50 MG PO TABS
50.0000 mg | ORAL_TABLET | Freq: Once | ORAL | Status: AC
  Administered 2020-12-24: 50 mg via ORAL
  Filled 2020-12-24: qty 1

## 2020-12-24 MED ORDER — HYDROQUINONE 4 % EX CREA: TOPICAL_CREAM | Freq: Two times a day (BID) | CUTANEOUS | Status: DC

## 2020-12-24 MED ORDER — METOPROLOL SUCCINATE ER 50 MG PO TB24
200.0000 mg | ORAL_TABLET | Freq: Every day | ORAL | Status: DC
  Administered 2020-12-24 – 2020-12-26 (×3): 200 mg via ORAL
  Filled 2020-12-24 (×4): qty 4

## 2020-12-24 MED ORDER — HYDROMORPHONE HCL 1 MG/ML IJ SOLN
0.5000 mg | INTRAMUSCULAR | Status: DC | PRN
  Administered 2020-12-24 – 2020-12-25 (×8): 1 mg via INTRAVENOUS
  Filled 2020-12-24 (×8): qty 1

## 2020-12-24 MED ORDER — INSULIN GLARGINE 100 UNIT/ML ~~LOC~~ SOLN
30.0000 [IU] | Freq: Every day | SUBCUTANEOUS | Status: DC
  Administered 2020-12-24 – 2020-12-25 (×2): 30 [IU] via SUBCUTANEOUS
  Filled 2020-12-24 (×2): qty 0.3

## 2020-12-24 MED ORDER — NICOTINE 21 MG/24HR TD PT24
21.0000 mg | MEDICATED_PATCH | Freq: Every day | TRANSDERMAL | Status: DC
  Administered 2020-12-24 – 2020-12-26 (×3): 21 mg via TRANSDERMAL
  Filled 2020-12-24 (×3): qty 1

## 2020-12-24 MED ORDER — ROSUVASTATIN CALCIUM 5 MG PO TABS
5.0000 mg | ORAL_TABLET | Freq: Every day | ORAL | Status: DC
Start: 2020-12-24 — End: 2020-12-26
  Administered 2020-12-24 – 2020-12-26 (×3): 5 mg via ORAL
  Filled 2020-12-24 (×3): qty 1

## 2020-12-24 MED ORDER — SODIUM CHLORIDE 0.9 % IV BOLUS
1000.0000 mL | Freq: Once | INTRAVENOUS | Status: AC
  Administered 2020-12-24: 1000 mL via INTRAVENOUS

## 2020-12-24 MED ORDER — HYDROMORPHONE HCL 1 MG/ML IJ SOLN: 1.0000 mg | Freq: Once | INTRAMUSCULAR | Status: DC

## 2020-12-24 MED ORDER — FLUTICASONE FUROATE-VILANTEROL 200-25 MCG/INH IN AEPB
1.0000 | INHALATION_SPRAY | Freq: Every day | RESPIRATORY_TRACT | Status: DC
  Administered 2020-12-25 – 2020-12-26 (×2): 1 via RESPIRATORY_TRACT
  Filled 2020-12-24: qty 28

## 2020-12-24 MED ORDER — HYDROCORTISONE 2.5 % EX CREA
1.0000 "application " | TOPICAL_CREAM | Freq: Two times a day (BID) | CUTANEOUS | Status: DC
  Administered 2020-12-25: 1 via TOPICAL

## 2020-12-24 MED ORDER — INSULIN ASPART 100 UNIT/ML ~~LOC~~ SOLN
0.0000 [IU] | Freq: Every day | SUBCUTANEOUS | Status: DC
  Filled 2020-12-24: qty 0.05

## 2020-12-24 MED ORDER — ACETAMINOPHEN 650 MG RE SUPP: 650.0000 mg | Freq: Four times a day (QID) | RECTAL | Status: DC | PRN

## 2020-12-24 MED ORDER — ALBUTEROL SULFATE (2.5 MG/3ML) 0.083% IN NEBU: 2.5000 mg | INHALATION_SOLUTION | Freq: Four times a day (QID) | RESPIRATORY_TRACT | Status: DC | PRN

## 2020-12-24 MED ORDER — INSULIN ASPART 100 UNIT/ML ~~LOC~~ SOLN
6.0000 [IU] | Freq: Three times a day (TID) | SUBCUTANEOUS | Status: DC
  Administered 2020-12-24: 6 [IU] via SUBCUTANEOUS
  Filled 2020-12-24: qty 0.06

## 2020-12-24 MED ORDER — ONDANSETRON HCL 4 MG PO TABS: 4.0000 mg | ORAL_TABLET | Freq: Four times a day (QID) | ORAL | Status: DC | PRN

## 2020-12-24 MED ORDER — INSULIN ASPART 100 UNIT/ML ~~LOC~~ SOLN
0.0000 [IU] | Freq: Three times a day (TID) | SUBCUTANEOUS | Status: DC
  Administered 2020-12-24: 20 [IU] via SUBCUTANEOUS
  Administered 2020-12-24: 15 [IU] via SUBCUTANEOUS
  Administered 2020-12-25 (×2): 4 [IU] via SUBCUTANEOUS
  Administered 2020-12-25: 15 [IU] via SUBCUTANEOUS
  Administered 2020-12-26 (×2): 7 [IU] via SUBCUTANEOUS
  Filled 2020-12-24: qty 0.2

## 2020-12-24 MED ORDER — ACETAMINOPHEN 325 MG PO TABS: 650.0000 mg | ORAL_TABLET | Freq: Four times a day (QID) | ORAL | Status: DC | PRN

## 2020-12-24 MED ORDER — BUPROPION HCL ER (SR) 150 MG PO TB12
150.0000 mg | ORAL_TABLET | Freq: Two times a day (BID) | ORAL | Status: DC
  Administered 2020-12-24 – 2020-12-26 (×5): 150 mg via ORAL
  Filled 2020-12-24 (×5): qty 1

## 2020-12-24 MED ORDER — PANTOPRAZOLE SODIUM 40 MG PO TBEC
40.0000 mg | DELAYED_RELEASE_TABLET | Freq: Every day | ORAL | Status: DC
  Administered 2020-12-24 – 2020-12-26 (×3): 40 mg via ORAL
  Filled 2020-12-24 (×3): qty 1

## 2020-12-24 MED ORDER — IOHEXOL 350 MG/ML SOLN
100.0000 mL | Freq: Once | INTRAVENOUS | Status: AC | PRN
  Administered 2020-12-24: 100 mL via INTRAVENOUS

## 2020-12-24 MED ORDER — ONDANSETRON HCL 4 MG/2ML IJ SOLN: 4.0000 mg | Freq: Four times a day (QID) | INTRAMUSCULAR | Status: DC | PRN

## 2020-12-24 MED ORDER — HYDROMORPHONE HCL 1 MG/ML IJ SOLN
1.0000 mg | Freq: Once | INTRAMUSCULAR | Status: AC
  Administered 2020-12-24: 1 mg via INTRAVENOUS
  Filled 2020-12-24: qty 1

## 2020-12-24 MED ORDER — TIOTROPIUM BROMIDE MONOHYDRATE 2.5 MCG/ACT IN AERS: 2.0000 | INHALATION_SPRAY | Freq: Every day | RESPIRATORY_TRACT | Status: DC

## 2020-12-24 MED ORDER — AMITRIPTYLINE HCL 25 MG PO TABS
75.0000 mg | ORAL_TABLET | Freq: Every day | ORAL | Status: DC
  Administered 2020-12-24 – 2020-12-26 (×3): 75 mg via ORAL
  Filled 2020-12-24 (×3): qty 3

## 2020-12-24 MED ORDER — ENOXAPARIN SODIUM 40 MG/0.4ML ~~LOC~~ SOLN
40.0000 mg | SUBCUTANEOUS | Status: DC
  Administered 2020-12-24 – 2020-12-26 (×3): 40 mg via SUBCUTANEOUS
  Filled 2020-12-24 (×3): qty 0.4

## 2020-12-24 MED ORDER — LORAZEPAM 2 MG/ML IJ SOLN
0.5000 mg | Freq: Every day | INTRAMUSCULAR | Status: AC | PRN
  Administered 2020-12-24: 0.5 mg via INTRAVENOUS
  Filled 2020-12-24: qty 1
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
## 2020-12-24 NOTE — ED Notes (Signed)
Bed alarm on External female catheter attached to patient Hot packs given for pain relief

## 2020-12-24 NOTE — ED Notes (Signed)
Repositioned patient Applied another hot pack for pain relief on abdomen

## 2020-12-24 NOTE — ED Notes (Signed)
ED TO INPATIENT HANDOFF REPORT  Name/Age/Gender Jill Shaw 45 y.o. female  Code Status    Code Status Orders  (From admission, onward)         Start     Ordered   12/24/20 0952  Full code  Continuous        12/24/20 0951        Code Status History    Date Active Date Inactive Code Status Order ID Comments User Context   11/20/2020 1806 11/23/2020 1722 Full Code [id number]  Teddy Spike, DO ED   10/15/2020 2351 10/17/2020 1933 Full Code [id number]  John Giovanni, MD ED   07/02/2020 0546 07/04/2020 2003 Full Code [id number]  Hillary Bow, DO ED   06/22/2020 1108 06/25/2020 1829 Full Code [id number]  Jonah Blue, MD ED   03/08/2020 0904 03/10/2020 1700 Full Code [id number]  Lucile Shutters, MD ED   01/29/2020 0848 02/03/2020 1714 Full Code [id number]  Jonah Blue, MD ED   12/19/2019 1605 12/20/2019 1855 Full Code [id number]  Tereso Newcomer, MD ED   08/20/2019 1333 08/21/2019 1145 Full Code [id number]  Allie Bossier, MD Inpatient   Advance Care Planning Activity      Home/SNF/Other Home  Chief Complaint SOB (shortness of breath) [R06.02]  Level of Care/Admitting Diagnosis ED Disposition    ED Disposition Condition Comment   Admit  Hospital Area: [facility] [[id number]]  Level of Care: Telemetry [5]  Admit to tele based on following criteria: Monitor for Ischemic changes  Covid Evaluation: Recent COVID positive no isolation required infection day 21-90  Diagnosis: SOB (shortness of breath) [427062]  Admitting Physician: Eduard Clos [phone]  Attending Physician: Eduard Clos [phone]       Medical History Past Medical History:  Diagnosis Date  . Anemia   . Arthritis    knees, hands  . Asthma   . Chronic diastolic (congestive) heart failure (HCC)   . COPD (chronic obstructive pulmonary disease) (HCC)   . Diabetes mellitus without complication (HCC)    type 2  . Dysfunctional uterine bleeding   . GERD (gastroesophageal reflux  disease)   . Hypertension   . Neuromuscular disorder (HCC)    neuropathy feet  . Seizures (HCC) 09/12/2017   pt states r/t stress and blood sugar - no meds last one 4 months ago, not seen neurologist  . Sickle cell trait (HCC)   . Smoker   . Vitamin D deficiency 10/2019  . Wears glasses     Allergies Allergies  Allergen Reactions  . Ketoprofen Nausea And Vomiting  . Aspirin Nausea Only  . Gabapentin Nausea And Vomiting and Other (See Comments)    upset stomach  . Ibuprofen Nausea And Vomiting  . Liraglutide Nausea And Vomiting  . Naproxen Nausea And Vomiting  . Omeprazole-Sodium Bicarbonate Nausea And Vomiting  . Sulfa Antibiotics Nausea And Vomiting  . Tramadol Nausea And Vomiting and Other (See Comments)    stomach upset    IV Location/Drains/Wounds Patient Lines/Drains/Airways Status    Active Line/Drains/Airways    Name Placement date Placement time Site Days   Peripheral IV 12/23/20 Right Antecubital 12/23/20  1820  Antecubital  1   External Urinary Catheter 10/15/20  1811  --  70   External Urinary Catheter 11/21/20  0130  --  33   Incision (Closed) 08/20/19 Vagina Other (Comment) 08/20/19  1703  -- 492          Labs/Imaging Results for orders placed  or performed during the hospital encounter of 12/23/20 (from the past 48 hour(s))  CBC with Differential     Status: Abnormal   Collection Time: 12/23/20  5:47 PM  Result Value Ref Range   WBC 13.1 (H) 4.0 - 10.5 K/uL   RBC 4.36 3.87 - 5.11 MIL/uL   Hemoglobin 9.9 (L) 12.0 - 15.0 g/dL   HCT 05.3 (L) 97.6 - 73.4 %   MCV 74.5 (L) 80.0 - 100.0 fL   MCH 22.7 (L) 26.0 - 34.0 pg   MCHC 30.5 30.0 - 36.0 g/dL   RDW 19.3 (H) 79.0 - 24.0 %   Platelets 386 150 - 400 K/uL   nRBC 0.0 0.0 - 0.2 %   Neutrophils Relative % 78 %   Neutro Abs 10.2 (H) 1.7 - 7.7 K/uL   Lymphocytes Relative 16 %   Lymphs Abs 2.1 0.7 - 4.0 K/uL   Monocytes Relative 5 %   Monocytes Absolute 0.6 0.1 - 1.0 K/uL   Eosinophils Relative 0 %    Eosinophils Absolute 0.0 0.0 - 0.5 K/uL   Basophils Relative 1 %   Basophils Absolute 0.1 0.0 - 0.1 K/uL   WBC Morphology VACUOLATED NEUTROPHILS    Immature Granulocytes 0 %   Abs Immature Granulocytes 0.04 0.00 - 0.07 K/uL    Comment: Performed at [facility], [address] [place], [place] [address]  Comprehensive metabolic panel     Status: Abnormal   Collection Time: 12/23/20  5:47 PM  Result Value Ref Range   Sodium 138 135 - 145 mmol/L   Potassium 3.8 3.5 - 5.1 mmol/L   Chloride 104 98 - 111 mmol/L   CO2 22 22 - 32 mmol/L   Glucose, Bld 199 (H) 70 - 99 mg/dL    Comment: Glucose reference range applies only to samples taken after fasting for at least 8 hours.   BUN 8 6 - 20 mg/dL   Creatinine, Ser 2.99 (H) 0.44 - 1.00 mg/dL   Calcium 9.7 8.9 - 24.2 mg/dL   Total Protein 8.1 6.5 - 8.1 g/dL   Albumin 4.0 3.5 - 5.0 g/dL   AST 18 15 - 41 U/L   ALT 10 0 - 44 U/L   Alkaline Phosphatase 104 38 - 126 U/L   Total Bilirubin 0.8 0.3 - 1.2 mg/dL   GFR, Estimated >68 >34 mL/min    Comment: (NOTE) Calculated using the CKD-EPI Creatinine Equation (2021)    Anion gap 12 5 - 15    Comment: Performed at [facility], [address], [place], [place] [address]  Lipase, blood     Status: None   Collection Time: 12/23/20  5:47 PM  Result Value Ref Range   Lipase 23 11 - 51 U/L    Comment: Performed at [facility], [address], [place], [place] [address]  Hemoglobin A1c     Status: Abnormal   Collection Time: 12/23/20  5:47 PM  Result Value Ref Range   Hgb A1c MFr Bld 9.7 (H) 4.8 - 5.6 %    Comment: (NOTE) Pre diabetes:          5.7%-6.4%  Diabetes:              >6.4%  Glycemic control for   <7.0% adults with diabetes    Mean Plasma Glucose 231.69 mg/dL    Comment: Performed at [facility], [address] [place], [place] [address]  I-Stat Beta hCG blood,  ED ([facility], [facility], [facility] only)     Status: None   Collection Time:  12/23/20  6:28 PM  Result Value Ref Range   I-stat hCG, quantitative <5.0 <5 mIU/mL   Comment 3            Comment:   GEST. AGE      CONC.  (mIU/mL)   <=1 WEEK        5 - 50     2 WEEKS       50 - 500     3 WEEKS       100 - 10,000     4 WEEKS     1,000 - 30,000        FEMALE AND NON-PREGNANT FEMALE:     LESS THAN 5 mIU/mL   Urinalysis, Routine w reflex microscopic     Status: Abnormal   Collection Time: 12/23/20 10:50 PM  Result Value Ref Range   Color, Urine YELLOW YELLOW   APPearance CLEAR CLEAR   Specific Gravity, Urine 1.010 1.005 - 1.030   pH 5.0 5.0 - 8.0   Glucose, UA 50 (A) NEGATIVE mg/dL   Hgb urine dipstick MODERATE (A) NEGATIVE   Bilirubin Urine NEGATIVE NEGATIVE   Ketones, ur NEGATIVE NEGATIVE mg/dL   Protein, ur 30 (A) NEGATIVE mg/dL   Nitrite NEGATIVE NEGATIVE   Leukocytes,Ua NEGATIVE NEGATIVE   RBC / HPF 6-10 0 - 5 RBC/hpf   WBC, UA 0-5 0 - 5 WBC/hpf   Bacteria, UA NONE SEEN NONE SEEN   Squamous Epithelial / LPF 0-5 0 - 5   Mucus PRESENT     Comment: Performed at [facility], [address], [place], [place] [address]  CBG monitoring, ED     Status: Abnormal   Collection Time: 12/24/20  3:47 AM  Result Value Ref Range   Glucose-Capillary 296 (H) 70 - 99 mg/dL    Comment: Glucose reference range applies only to samples taken after fasting for at least 8 hours.  Blood gas, arterial (at [facility])     Status: Abnormal   Collection Time: 12/24/20  4:12 AM  Result Value Ref Range   pH, Arterial 7.381 7.350 - 7.450   pCO2 arterial 37.3 32.0 - 48.0 mmHg   pO2, Arterial 39.7 (LL) 83.0 - 108.0 mmHg    Comment: CRITICAL RESULT CALLED TO, READ BACK BY AND VERIFIED WITH: BOBBY BROOKS @ 0423 ON 12/24/20 C VARNER    Bicarbonate 21.6 20.0 - 28.0 mmol/L   Acid-base deficit 2.6 (H) 0.0 - 2.0 mmol/L   O2 Saturation 66.0 %   Patient temperature 98.6    Allens test (pass/fail) PASS PASS    Comment: Performed at [facility], [address] [place], [place] [address]  Resp Panel by RT-PCR (Flu A&B, Covid) Nasopharyngeal Swab     Status: None   Collection Time: 12/24/20  5:05 AM   Specimen: Nasopharyngeal Swab; Nasopharyngeal(NP) swabs in vial transport medium  Result Value Ref Range   SARS Coronavirus 2 by RT PCR NEGATIVE NEGATIVE    Comment: (NOTE) SARS-CoV-2 target nucleic acids are NOT DETECTED.  The SARS-CoV-2 RNA is generally detectable in upper respiratory specimens during the acute phase of infection. The lowest concentration of SARS-CoV-2 viral copies this assay can detect is 138 copies/mL. A negative result does not preclude SARS-Cov-2 infection and should not be used as the sole basis for treatment or other patient management decisions. A negative result may occur  with  improper specimen collection/handling, submission of specimen other than nasopharyngeal swab, presence of viral mutation(s) within the areas targeted by this assay, and inadequate number of viral copies(<138 copies/mL). A negative result must be combined with clinical observations, patient history, and epidemiological information. The expected result is Negative.  Fact Sheet for Patients:  BloggerCourse.com  Fact Sheet for Healthcare Providers:  SeriousBroker.it  This test is no t yet approved or cleared by the [place] FDA and  has been authorized for detection and/or diagnosis of SARS-CoV-2 by FDA under an Emergency Use Authorization (EUA). This EUA will remain  in effect (meaning this test can be used) for the duration of the COVID-19 declaration under Section 564(b)(1) of the Act, 21 U.S.C.section 360bbb-3(b)(1), unless the authorization is terminated  or revoked sooner.       Influenza A by PCR NEGATIVE NEGATIVE   Influenza B by PCR NEGATIVE NEGATIVE    Comment: (NOTE) The Xpert Xpress SARS-CoV-2/FLU/RSV plus assay is intended as an aid in the diagnosis of influenza  from Nasopharyngeal swab specimens and should not be used as a sole basis for treatment. Nasal washings and aspirates are unacceptable for Xpert Xpress SARS-CoV-2/FLU/RSV testing.  Fact Sheet for Patients: BloggerCourse.com  Fact Sheet for Healthcare Providers: SeriousBroker.it  This test is not yet approved or cleared by the [place] FDA and has been authorized for detection and/or diagnosis of SARS-CoV-2 by FDA under an Emergency Use Authorization (EUA). This EUA will remain in effect (meaning this test can be used) for the duration of the COVID-19 declaration under Section 564(b)(1) of the Act, 21 U.S.C. section 360bbb-3(b)(1), unless the authorization is terminated or revoked.  Performed at [facility], [address], [place], [place] [address]   Troponin I (High Sensitivity)     Status: None   Collection Time: 12/24/20  6:04 AM  Result Value Ref Range   Troponin I (High Sensitivity) 4 <18 ng/L    Comment: (NOTE) Elevated high sensitivity troponin I (hsTnI) values and significant  changes across serial measurements may suggest ACS but many other  chronic and acute conditions are known to elevate hsTnI results.  Refer to the "Links" section for chest pain algorithms and additional  guidance. Performed at [facility], [address] [place], [place] [address]   D-dimer, quantitative     Status: None   Collection Time: 12/24/20  6:04 AM  Result Value Ref Range   D-Dimer, Quant 0.48 0.00 - 0.50 ug/mL-FEU    Comment: (NOTE) At the manufacturer cut-off value of 0.5 g/mL FEU, this assay has a negative predictive value of 95-100%.This assay is intended for use in conjunction with a clinical pretest probability (PTP) assessment model to exclude pulmonary embolism (PE) and deep venous thrombosis (DVT) in outpatients suspected of PE or DVT. Results should be correlated with clinical  presentation. Performed at [facility], [address], [place], [place] [address]   Troponin I (High Sensitivity)     Status: None   Collection Time: 12/24/20  8:30 AM  Result Value Ref Range   Troponin I (High Sensitivity) 5 <18 ng/L    Comment: (NOTE) Elevated high sensitivity troponin I (hsTnI) values and significant  changes across serial measurements may suggest ACS but many other  chronic and acute conditions are known to elevate hsTnI results.  Refer to the "Links" section for chest pain algorithms and additional  guidance. Performed at [facility], [address] [place], [place]  [address]   CBG monitoring, ED     Status: Abnormal   Collection Time: 12/24/20 12:04 PM  Result Value Ref Range   Glucose-Capillary 343 (H) 70 - 99 mg/dL    Comment: Glucose reference range applies only to samples taken after fasting for at least 8 hours.   CT Angio Chest PE W and/or Wo Contrast  Result Date: 12/24/2020 CLINICAL DATA:  45 year old female with abdominal pain. Increasing tachycardia. EXAM: CT ANGIOGRAPHY CHEST WITH CONTRAST TECHNIQUE: Multidetector CT imaging of the chest was performed using the standard protocol during bolus administration of intravenous contrast. Multiplanar CT image reconstructions and MIPs were obtained to evaluate the vascular anatomy. CONTRAST:  OMNIPAQUE IOHEXOL 350 MG/ML SOLN COMPARISON:  CT Abdomen and Pelvis yesterday. Portable chest x-ray earlier today. CTA chest 11/20/2020. FINDINGS: Cardiovascular: Adequate contrast bolus timing in the pulmonary arterial tree. Mild respiratory motion. No central or hilar pulmonary embolus identified. Upper lobe branches are better visualized and appear patent. Subsegmental and distal lower lung branches are obscured by motion. Borderline to mild cardiomegaly. No pericardial effusion. Negative visible aorta. No calcified coronary artery atherosclerosis is evident. Mediastinum/Nodes:  Negative. Lungs/Pleura: Stable lung volumes. Major airways remain patent. Mild chronic atelectasis in both middle lobes. Occasional small right lung pulmonary nodules remain stable since October (series 6, image 74). Increased lower lobe linear atelectasis today. No pleural effusion or other acute pulmonary finding. Upper Abdomen: Stable visible upper abdomen. Musculoskeletal: Subtle L1 superior endplate compression is new since January. As is similar T11 and T12 endplate compression which is new since last month. No other acute osseous abnormality. Review of the MIP images confirms the above findings. IMPRESSION: 1. No central or hilar pulmonary embolus. Subsegmental and distal lower lung branches are obscured by motion. 2. Suspect multiple acute to subacute compression fractures: Mild superior endplate compression fractures of T11 and T12 are new since last month, and at L1 appears new since January. If specific therapy such as vertebroplasty is desired, Thoracic/Lumbar MRI or Nuclear Medicine Whole-body Bone Scan would best determine acuity. 3. Mild atelectasis. Small right lung nodules remain stable since October (please see that report for follow-up recommendations). Electronically Signed   By: Odessa Fleming M.D.   On: 12/24/2020 08:28   CT ABDOMEN PELVIS W CONTRAST  Result Date: 12/23/2020 CLINICAL DATA:  Acute nonlocalized abdominal pain. EXAM: CT ABDOMEN AND PELVIS WITH CONTRAST TECHNIQUE: Multidetector CT imaging of the abdomen and pelvis was performed using the standard protocol following bolus administration of intravenous contrast. CONTRAST:  OMNIPAQUE IOHEXOL 300 MG/ML  SOLN COMPARISON:  10/16/2020.  Other scans as distant as 01/31/2020 FINDINGS: Lower chest: Mild chronic scarring and or atelectasis at the lung bases. Hepatobiliary: Chronic areas of geographic fatty change or fibrosis within the liver. No acute liver finding. No calcified gallstones. Pancreas: Normal Spleen: Normal Adrenals/Urinary  Tract: Adrenal glands are normal. Kidneys are normal. No cyst, mass, stone or hydronephrosis. Stomach/Bowel: No bowel pathology. Stomach and small intestine are normal. Appendix is normal. No acute colon pathology. Vascular/Lymphatic: Normal Reproductive: Chronic uterine fibroid.  No adnexal mass. Other: No free fluid or air. Musculoskeletal: Normal IMPRESSION: 1. No acute finding by CT. Chronic areas of geographic fatty change or fibrosis within the liver. 2. Chronic uterine fibroid. Electronically Signed   By: Paulina Fusi M.D.   On: 12/23/2020 20:39   DG Chest Portable 1 View  Result Date: 12/24/2020 CLINICAL DATA:  Shortness of breath EXAM: PORTABLE CHEST 1 VIEW COMPARISON:  11/20/2020 FINDINGS: The heart size  and mediastinal contours are within normal limits. Both lungs are clear. The visualized skeletal structures are unremarkable. IMPRESSION: No active disease. Electronically Signed   By: Deatra RobinsonKevin  Herman M.D.   On: 12/24/2020 03:50    Pending Labs Unresulted Labs (From admission, onward)          Start     Ordered   12/25/20 0500  Basic metabolic panel  Daily,   R      12/24/20 0951   12/25/20 0500  CBC  Daily,   R      12/24/20 0951          Vitals/Pain Today's Vitals   12/24/20 1300 12/24/20 1310 12/24/20 1315 12/24/20 1330  BP: (!) 210/86     Pulse: (!) 105  (!) 108 92  Resp: 20  18 16   Temp:      TempSrc:      SpO2: 98%  98% 100%  Weight:      Height:      PainSc:  10-Worst pain ever      Isolation Precautions No active isolations  Medications Medications  furosemide (LASIX) tablet 40 mg (40 mg Oral Given 12/24/20 1201)  hydrALAZINE (APRESOLINE) tablet 50 mg (50 mg Oral Given 12/24/20 1200)  metoprolol succinate (TOPROL-XL) 24 hr tablet 200 mg (200 mg Oral Given 12/24/20 1201)  rosuvastatin (CRESTOR) tablet 5 mg (5 mg Oral Given 12/24/20 1201)  amitriptyline (ELAVIL) tablet 75 mg (75 mg Oral Given 12/24/20 1211)  buPROPion (WELLBUTRIN SR) 12 hr tablet 150 mg (150 mg  Oral Given 12/24/20 1211)  nicotine (NICODERM CQ - dosed in mg/24 hours) patch 21 mg (21 mg Transdermal Patch Applied 12/24/20 1159)  pantoprazole (PROTONIX) EC tablet 40 mg (40 mg Oral Given 12/24/20 1201)  albuterol (PROVENTIL) (2.5 MG/3ML) 0.083% nebulizer solution 2.5 mg (has no administration in time range)  fluticasone furoate-vilanterol (BREO ELLIPTA) 200-25 MCG/INH 1 puff (1 puff Inhalation Not Given 12/24/20 1133)  Tiotropium Bromide Monohydrate AERS 2 puff (2 puffs Inhalation Not Given 12/24/20 1133)  acetaminophen (TYLENOL) tablet 650 mg (has no administration in time range)    Or  acetaminophen (TYLENOL) suppository 650 mg (has no administration in time range)  HYDROcodone-acetaminophen (NORCO/VICODIN) 5-325 MG per tablet 1-2 tablet (has no administration in time range)  enoxaparin (LOVENOX) injection 40 mg (40 mg Subcutaneous Given 12/24/20 1200)  sodium chloride flush (NS) 0.9 % injection 3 mL (3 mLs Intravenous Given 12/24/20 1244)  lactated ringers infusion ( Intravenous New Bag/Given 12/24/20 1243)  HYDROmorphone (DILAUDID) injection 0.5-1 mg (1 mg Intravenous Given 12/24/20 1329)  ondansetron (ZOFRAN) tablet 4 mg (has no administration in time range)    Or  ondansetron (ZOFRAN) injection 4 mg (has no administration in time range)  insulin glargine (LANTUS) injection 30 Units (30 Units Subcutaneous Given 12/24/20 1239)  insulin aspart (novoLOG) injection 0-20 Units (15 Units Subcutaneous Given 12/24/20 1239)  insulin aspart (novoLOG) injection 0-5 Units (has no administration in time range)  insulin aspart (novoLOG) injection 6 Units (6 Units Subcutaneous Given 12/24/20 1240)  labetalol (NORMODYNE) injection 10 mg (has no administration in time range)  HYDROmorphone (DILAUDID) injection 1 mg (1 mg Intravenous Given 12/23/20 1824)  ondansetron (ZOFRAN) injection 4 mg (4 mg Intravenous Given 12/23/20 1821)  hydrALAZINE (APRESOLINE) tablet 50 mg (50 mg Oral Given 12/23/20 2045)  iohexol  (OMNIPAQUE) 300 MG/ML solution 100 mL (100 mLs Intravenous Contrast Given 12/23/20 2013)  droperidol (INAPSINE) 2.5 MG/ML injection 1.25 mg (1.25 mg Intravenous Given 12/23/20 2230)  fentaNYL (  SUBLIMAZE) injection 100 mcg (100 mcg Intravenous Given 12/23/20 2230)  hydrALAZINE (APRESOLINE) injection 10 mg (10 mg Intravenous Given 12/24/20 0138)  HYDROmorphone (DILAUDID) injection 1 mg (1 mg Intravenous Given 12/24/20 0233)  sodium chloride 0.9 % bolus 1,000 mL (0 mLs Intravenous Stopped 12/24/20 0600)  iohexol (OMNIPAQUE) 350 MG/ML injection 100 mL (100 mLs Intravenous Contrast Given 12/24/20 0750)  morphine 2 MG/ML injection 2 mg (2 mg Intravenous Given 12/24/20 0827)  metoCLOPramide (REGLAN) injection 10 mg (10 mg Intravenous Given 12/24/20 0958)    Mobility walks

## 2020-12-24 NOTE — ED Provider Notes (Signed)
Care assumed from Dr. Stevie Kern.  Patient with COPD, diabetes, abdominal pain here with nausea and vomiting.  She is awaiting a p.o. challenge.  Her work-up has been unremarkable.  No evidence of DKA  Patient obtunded after receiving medications.  She does not have any further vomiting but does complain of pain when she is awoken.  Has become more more tachycardic throughout her stay.  She is given additional IV fluids  Patient feels improved and has no further nausea or vomiting.  However she is tachycardic and dyspneic.  With ambulation her rate elevates to the 160s but her oxygen level is 100% on room air.  She becomes quite short of breath but denies chest pain.  EKG is sinus rhythm and chest x-ray is clear. We will add on troponin and D-dimer  Patient did have Covid in mid February and a CT angiogram at that time was inconclusive.  Given her persistent dyspnea with tachycardia we will proceed with CT imaging of her chest again. She denies any further abdominal pain or chest pain.  Troponin and D-dimer are pending.  Admission discussed with Dr. Toniann Fail.   Glynn Octave, MD 12/24/20 [phone]

## 2020-12-24 NOTE — ED Notes (Signed)
Patient ambulated to RR w/o assistance. Alert and oriented x4.

## 2020-12-25 DIAGNOSIS — I130 Hypertensive heart and chronic kidney disease with heart failure and stage 1 through stage 4 chronic kidney disease, or unspecified chronic kidney disease: Secondary | ICD-10-CM | POA: Diagnosis not present

## 2020-12-25 DIAGNOSIS — M19042 Primary osteoarthritis, left hand: Secondary | ICD-10-CM | POA: Diagnosis not present

## 2020-12-25 DIAGNOSIS — I5032 Chronic diastolic (congestive) heart failure: Secondary | ICD-10-CM | POA: Diagnosis not present

## 2020-12-25 DIAGNOSIS — J449 Chronic obstructive pulmonary disease, unspecified: Secondary | ICD-10-CM | POA: Diagnosis not present

## 2020-12-25 DIAGNOSIS — F32A Depression, unspecified: Secondary | ICD-10-CM | POA: Diagnosis not present

## 2020-12-25 DIAGNOSIS — R0602 Shortness of breath: Secondary | ICD-10-CM | POA: Diagnosis not present

## 2020-12-25 DIAGNOSIS — Z20822 Contact with and (suspected) exposure to covid-19: Secondary | ICD-10-CM | POA: Diagnosis not present

## 2020-12-25 DIAGNOSIS — R000 Tachycardia, unspecified: Secondary | ICD-10-CM | POA: Diagnosis not present

## 2020-12-25 DIAGNOSIS — R1084 Generalized abdominal pain: Secondary | ICD-10-CM | POA: Diagnosis not present

## 2020-12-25 DIAGNOSIS — R569 Unspecified convulsions: Secondary | ICD-10-CM | POA: Diagnosis not present

## 2020-12-25 DIAGNOSIS — D631 Anemia in chronic kidney disease: Secondary | ICD-10-CM | POA: Diagnosis not present

## 2020-12-25 DIAGNOSIS — M4854XA Collapsed vertebra, not elsewhere classified, thoracic region, initial encounter for fracture: Secondary | ICD-10-CM | POA: Diagnosis not present

## 2020-12-25 DIAGNOSIS — N1831 Chronic kidney disease, stage 3a: Secondary | ICD-10-CM | POA: Diagnosis not present

## 2020-12-25 DIAGNOSIS — E1143 Type 2 diabetes mellitus with diabetic autonomic (poly)neuropathy: Principal | ICD-10-CM

## 2020-12-25 DIAGNOSIS — Z8616 Personal history of COVID-19: Secondary | ICD-10-CM | POA: Diagnosis not present

## 2020-12-25 DIAGNOSIS — E1122 Type 2 diabetes mellitus with diabetic chronic kidney disease: Secondary | ICD-10-CM | POA: Diagnosis not present

## 2020-12-25 DIAGNOSIS — G4733 Obstructive sleep apnea (adult) (pediatric): Secondary | ICD-10-CM | POA: Diagnosis not present

## 2020-12-25 DIAGNOSIS — M19041 Primary osteoarthritis, right hand: Secondary | ICD-10-CM | POA: Diagnosis not present

## 2020-12-25 DIAGNOSIS — E1142 Type 2 diabetes mellitus with diabetic polyneuropathy: Secondary | ICD-10-CM | POA: Diagnosis not present

## 2020-12-25 DIAGNOSIS — D573 Sickle-cell trait: Secondary | ICD-10-CM | POA: Diagnosis not present

## 2020-12-25 DIAGNOSIS — K3184 Gastroparesis: Secondary | ICD-10-CM

## 2020-12-25 DIAGNOSIS — M4856XA Collapsed vertebra, not elsewhere classified, lumbar region, initial encounter for fracture: Secondary | ICD-10-CM | POA: Diagnosis not present

## 2020-12-25 DIAGNOSIS — K219 Gastro-esophageal reflux disease without esophagitis: Secondary | ICD-10-CM | POA: Diagnosis not present

## 2020-12-25 DIAGNOSIS — D72829 Elevated white blood cell count, unspecified: Secondary | ICD-10-CM | POA: Diagnosis not present

## 2020-12-25 DIAGNOSIS — Z6841 Body Mass Index (BMI) 40.0 and over, adult: Secondary | ICD-10-CM | POA: Diagnosis not present

## 2020-12-25 DIAGNOSIS — M170 Bilateral primary osteoarthritis of knee: Secondary | ICD-10-CM | POA: Diagnosis not present

## 2020-12-25 LAB — BASIC METABOLIC PANEL
Anion gap: 12 (ref 5–15)
BUN: 9 mg/dL (ref 6–20)
CO2: 20 mmol/L — ABNORMAL LOW (ref 22–32)
Calcium: 9 mg/dL (ref 8.9–10.3)
Chloride: 104 mmol/L (ref 98–111)
Creatinine, Ser: 1.16 mg/dL — ABNORMAL HIGH (ref 0.44–1.00)
GFR, Estimated: 60 mL/min — ABNORMAL LOW (ref >60)
Glucose, Bld: 164 mg/dL — ABNORMAL HIGH (ref 70–99)
Potassium: 4.7 mmol/L (ref 3.5–5.1)
Sodium: 136 mmol/L (ref 135–145)

## 2020-12-25 LAB — CBC
HCT: 32.3 % — ABNORMAL LOW (ref 36.0–46.0)
Hemoglobin: 9.8 g/dL — ABNORMAL LOW (ref 12.0–15.0)
MCH: 22.6 pg — ABNORMAL LOW (ref 26.0–34.0)
MCHC: 30.3 g/dL (ref 30.0–36.0)
MCV: 74.6 fL — ABNORMAL LOW (ref 80.0–100.0)
Platelets: 313 10*3/uL (ref 150–400)
RBC: 4.33 MIL/uL (ref 3.87–5.11)
RDW: 25 % — ABNORMAL HIGH (ref 11.5–15.5)
WBC: 10.7 10*3/uL — ABNORMAL HIGH (ref 4.0–10.5)
nRBC: 0.3 % — ABNORMAL HIGH (ref 0.0–0.2)

## 2020-12-25 LAB — GLUCOSE, CAPILLARY
Glucose-Capillary: 70 mg/dL (ref 70–99)
Glucose-Capillary: 198 mg/dL — ABNORMAL HIGH (ref 70–99)
Glucose-Capillary: 306 mg/dL — ABNORMAL HIGH (ref 70–99)
Glucose-Capillary: 189 mg/dL — ABNORMAL HIGH (ref 70–99)
Glucose-Capillary: 153 mg/dL — ABNORMAL HIGH (ref 70–99)

## 2020-12-25 MED ORDER — ALUM & MAG HYDROXIDE-SIMETH 200-200-20 MG/5ML PO SUSP
30.0000 mL | ORAL | Status: DC | PRN
  Administered 2020-12-25: 30 mL via ORAL
  Filled 2020-12-25: qty 30

## 2020-12-25 MED ORDER — GUAIFENESIN ER 600 MG PO TB12
600.0000 mg | ORAL_TABLET | Freq: Two times a day (BID) | ORAL | Status: DC | PRN
  Administered 2020-12-25: 600 mg via ORAL
  Filled 2020-12-25: qty 1

## 2020-12-25 MED ORDER — LIDOCAINE 5 % EX PTCH
1.0000 | MEDICATED_PATCH | CUTANEOUS | Status: DC
  Administered 2020-12-25 – 2020-12-26 (×2): 1 via TRANSDERMAL
  Filled 2020-12-25 (×2): qty 1

## 2020-12-25 MED ORDER — INSULIN GLARGINE 100 UNIT/ML ~~LOC~~ SOLN
25.0000 [IU] | Freq: Two times a day (BID) | SUBCUTANEOUS | Status: DC
  Administered 2020-12-25 – 2020-12-26 (×2): 25 [IU] via SUBCUTANEOUS
  Filled 2020-12-25 (×2): qty 0.25

## 2020-12-25 MED ORDER — INSULIN ASPART 100 UNIT/ML ~~LOC~~ SOLN
15.0000 [IU] | Freq: Three times a day (TID) | SUBCUTANEOUS | Status: DC
  Administered 2020-12-25 – 2020-12-26 (×3): 15 [IU] via SUBCUTANEOUS

## 2020-12-25 MED ORDER — TIZANIDINE HCL 4 MG PO TABS
4.0000 mg | ORAL_TABLET | Freq: Three times a day (TID) | ORAL | Status: DC | PRN
  Administered 2020-12-25 – 2020-12-26 (×2): 4 mg via ORAL
  Filled 2020-12-25 (×3): qty 1

## 2020-12-25 MED ORDER — LOSARTAN POTASSIUM 50 MG PO TABS
50.0000 mg | ORAL_TABLET | Freq: Every day | ORAL | Status: DC
  Administered 2020-12-26: 50 mg via ORAL
  Filled 2020-12-25: qty 1
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
## 2020-12-26 DIAGNOSIS — K3184 Gastroparesis: Secondary | ICD-10-CM | POA: Diagnosis not present

## 2020-12-26 DIAGNOSIS — E1143 Type 2 diabetes mellitus with diabetic autonomic (poly)neuropathy: Secondary | ICD-10-CM | POA: Diagnosis not present

## 2020-12-26 LAB — BASIC METABOLIC PANEL
Anion gap: 9 (ref 5–15)
BUN: 10 mg/dL (ref 6–20)
CO2: 23 mmol/L (ref 22–32)
Calcium: 8.9 mg/dL (ref 8.9–10.3)
Chloride: 102 mmol/L (ref 98–111)
Creatinine, Ser: 1.17 mg/dL — ABNORMAL HIGH (ref 0.44–1.00)
GFR, Estimated: 59 mL/min — ABNORMAL LOW (ref >60)
Glucose, Bld: 243 mg/dL — ABNORMAL HIGH (ref 70–99)
Potassium: 4.6 mmol/L (ref 3.5–5.1)
Sodium: 134 mmol/L — ABNORMAL LOW (ref 135–145)

## 2020-12-26 LAB — GLUCOSE, CAPILLARY
Glucose-Capillary: 244 mg/dL — ABNORMAL HIGH (ref 70–99)
Glucose-Capillary: 251 mg/dL — ABNORMAL HIGH (ref 70–99)

## 2020-12-26 MED ORDER — LIDOCAINE 5 % EX PTCH: 1.0000 | MEDICATED_PATCH | CUTANEOUS | 0 refills | Status: DC

## 2020-12-26 MED ORDER — HYDROCODONE-ACETAMINOPHEN 5-325 MG PO TABS: 1.0000 | ORAL_TABLET | Freq: Four times a day (QID) | ORAL | 0 refills | Status: AC | PRN

## 2020-12-26 NOTE — Discharge Summary (Signed)
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  MCV 74.5* 74.6*  PLT 386 313   Cardiac Enzymes: No results for input(s): CKTOTAL, CKMB, CKMBINDEX, TROPONINI in the last 168 hours. BNP: Invalid input(s): POCBNP CBG: Recent Labs  Lab 12/25/20 1158 12/25/20 1729 12/25/20 2053 12/26/20 0748 12/26/20 1209  GLUCAP 306* 189* 153* 244* 251*   D-Dimer Recent Labs    12/24/20 0604  DDIMER 0.48   Hgb A1c Recent Labs    12/23/20 1747  HGBA1C 9.7*   Lipid Profile No results for input(s): CHOL, HDL, LDLCALC, TRIG, CHOLHDL, LDLDIRECT in the last 72 hours. Thyroid function studies No results for input(s): TSH, T4TOTAL, T3FREE, THYROIDAB in the last 72 hours.  Invalid input(s):  FREET3 Anemia work up No results for input(s): VITAMINB12, FOLATE, FERRITIN, TIBC, IRON, RETICCTPCT in the last 72 hours. Urinalysis    Component Value Date/Time   COLORURINE YELLOW 12/23/2020 2250   APPEARANCEUR CLEAR 12/23/2020 2250   LABSPEC 1.010 12/23/2020 2250   PHURINE 5.0 12/23/2020  [place] (A) 12/23/2020 2250   HGBUR MODERATE (A) 12/23/2020 2250   BILIRUBINUR NEGATIVE 12/23/2020 2250   BILIRUBINUR negative 06/02/2020 1419   BILIRUBINUR NEGATIVE 11/05/2019 1342   KETONESUR NEGATIVE 12/23/2020 2250   PROTEINUR 30 (A) 12/23/2020 2250   UROBILINOGEN 0.2 11/12/2020 1148   NITRITE NEGATIVE 12/23/2020 2250   LEUKOCYTESUR NEGATIVE 12/23/2020 2250   Sepsis Labs Invalid input(s): PROCALCITONIN,  WBC,  LACTICIDVEN Microbiology Recent Results (from the past 240 hour(s))  Resp Panel by RT-PCR (Flu A&B, Covid) Nasopharyngeal Swab     Status: None   Collection Time: 12/24/20  5:05 AM   Specimen: Nasopharyngeal Swab; Nasopharyngeal(NP) swabs in vial transport medium  Result Value Ref Range Status   SARS Coronavirus 2 by RT PCR NEGATIVE NEGATIVE Final    Comment: (NOTE) SARS-CoV-2 target nucleic acids are NOT DETECTED.  The SARS-CoV-2 RNA is generally detectable in upper respiratory specimens during the acute phase of infection. The lowest concentration of SARS-CoV-2 viral copies this assay can detect is 138 copies/mL. A negative result does not preclude SARS-Cov-2 infection and should not be used as the sole basis for treatment or other patient management decisions. A negative result may occur with  improper specimen collection/handling, submission of specimen other than nasopharyngeal swab, presence of viral mutation(s) within the areas targeted by this assay, and inadequate number of viral copies(<138 copies/mL). A negative result must be combined with clinical observations, patient history, and epidemiological information. The expected result is Negative.  Fact  Sheet for Patients:  EntrepreneurPulse.com.au  Fact Sheet for Healthcare Providers:  IncredibleEmployment.be  This test is no t yet approved or cleared by the [place] FDA and  has been authorized for detection and/or diagnosis of SARS-CoV-2 by FDA under an Emergency Use Authorization (EUA). This EUA will remain  in effect (meaning this test can be used) for the duration of the COVID-19 declaration under Section 564(b)(1) of the Act, 21 U.S.C.section 360bbb-3(b)(1), unless the authorization is terminated  or revoked sooner.       Influenza A by PCR NEGATIVE NEGATIVE Final   Influenza B by PCR NEGATIVE NEGATIVE Final    Comment: (NOTE) The Xpert Xpress SARS-CoV-2/FLU/RSV plus assay is intended as an aid in the diagnosis of influenza from Nasopharyngeal swab specimens and should not be used as a sole basis for treatment. Nasal washings and aspirates are unacceptable for Xpert Xpress SARS-CoV-2/FLU/RSV testing.  Fact Sheet for Patients: EntrepreneurPulse.com.au  Fact Sheet for Healthcare Providers: IncredibleEmployment.be  This test is not yet approved or cleared by the [place] FDA and has been authorized for detection and/or diagnosis of SARS-CoV-2 by FDA under an Emergency Use Authorization (EUA). This EUA will remain in effect (meaning this test can be used) for the duration of the COVID-19 declaration under Section 564(b)(1) of the Act, 21 U.S.C. section 360bbb-3(b)(1), unless the authorization is terminated or revoked.  Performed at [facility], [place] [address], [place], [place] [address]      Time coordinating discharge: 25 minutes  SIGNED: Antonieta Pert, MD  [facility] 12/26/2020, 1:21 PM  If 7PM-7AM, please contact night-coverage [email or web address]
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
## 2020-12-26 NOTE — Discharge Instructions (Signed)
Follow-up with your primary care doctor regarding your symptoms from today.  Take Zofran as needed for nausea.  If your pain is uncontrolled, vomiting uncontrolled, fever or other ne  Balloon Kyphoplasty Balloon kyphoplasty is a procedure to treat a spinal compression fracture, which is a collapse of the bones that form the spine (vertebrae). With this type of fracture, the vertebrae are squeezed (compressed) into a wedge shape, and this causes pain. In this procedure, the collapsed vertebrae are expanded with a balloon. Bone cement is then injected into the vertebrae to strengthen them. Tell a health care provider about:  Any allergies you have.  All medicines you are taking, including vitamins, herbs, eye drops, creams, and over-the-counter medicines.  Any problems you or family members have had with anesthetic medicines.  Any blood disorders you have.  Any surgeries you have had.  Any medical conditions you have or have had.  Whether you are pregnant or may be pregnant. What are the risks? Generally, this is a safe procedure. However, problems may occur, including:  Infection.  Bleeding.  Increased back pain.  Allergic reactions to medicines.  Damage to nearby structures, nerves, or organs.  Leaking of bone cement into other parts of the body. What happens before the procedure? Staying hydrated Follow instructions from your health care provider about hydration, which may include:  Up to 2 hours before the procedure - you may continue to drink clear liquids, such as water, clear fruit juice, black coffee, and plain tea.   Eating and drinking restrictions Follow instructions from your health care provider about eating and drinking, which may include:  8 hours before the procedure - stop eating heavy meals or foods, such as meat, fried foods, or fatty foods.  6 hours before the procedure - stop eating light meals or foods, such as toast or cereal.  6 hours before the  procedure - stop drinking milk or drinks that contain milk.  2 hours before the procedure - stop drinking clear liquids. Medicines Ask your health care provider about:  Changing or stopping your regular medicines. This is especially important if you are taking diabetes medicines or blood thinners.  Taking medicines such as aspirin and ibuprofen. These medicines can thin your blood. Do not take these medicines unless your health care provider tells you to take them.  Taking over-the-counter medicines, vitamins, herbs, and supplements. General instructions  Follow instructions from your health care provider about eating or drinking restrictions.  Do not use any products that contain nicotine or tobacco for at least 4 weeks before the procedure. These products include cigarettes, e-cigarettes, and chewing tobacco. If you need help quitting, ask your health care provider.  Ask your health care provider: ? How your surgery site will be marked. ? What steps will be taken to help prevent infection. These may include:  Removing hair at the surgery site.  Washing skin with a germ-killing soap.  Taking antibiotic medicine.  Plan to have someone take you home from the hospital or clinic.  If you will be going home right after the procedure, plan to have someone with you for 24 hours. What happens during the procedure?  An IV will be inserted into one of your veins.  You will be given one or more of the following: ? A medicine to help you relax (sedative). ? A medicine to numb the area (local anesthetic). ? A medicine to make you fall asleep (general anesthetic).  Your surgeon will use an X-ray machine to  see your spinal compression fracture.  A hollow needle will be inserted through the skin and into the spine.  Through this needle, the balloon will be placed in your spine where the fractures are located.  The balloon will be inflated. This will create space and push the bone back  toward its normal height and shape.  The balloon will be removed.  The newly created space in your spine will be filled with bone cement that will harden quickly.  When the cement hardens, the hollow needle will be removed.  The needle puncture site will be closed with skin glue or adhesive tape.  A bandage (dressing) may be used to cover the needle puncture site. The procedure may vary among health care providers and hospitals.   What happens after the procedure?  Your blood pressure, heart rate, breathing rate, and blood oxygen level will be monitored until you leave the hospital or clinic.  You will have some pain. Pain medicine will be available to help you.  An ice pack may be placed over the needle site.  If you were given a sedative during the procedure, it can affect you for several hours. Do not drive or operate machinery until your health care provider says that it is safe. Summary  Balloon kyphoplasty is a procedure to treat a spinal compression fracture, which is a collapse of the bones that form the spine (vertebrae).  Before the procedure, follow instructions from your health care provider about eating or drinking restrictions.  Ask your health care provider about changing or stopping your regular medicines. This is especially important if you are taking diabetes medicines or blood thinners.  Plan to have someone take you home from the hospital or clinic. If you were given a sedative during the procedure, do not drive or operate machinery until your health care provider says that it is safe.  If you will be going home right after the procedure, plan to have someone with you for 24 hours. This information is not intended to replace advice given to you by your health care provider. Make sure you discuss any questions you have with your health care provider. Document Revised: 01/21/2020 Document Reviewed: 01/21/2020 Elsevier Patient Education  [place].  Spinal Compression Fracture  A spinal compression fracture is a collapse of the bones that form the spine (vertebrae). With this type of fracture, the vertebrae become pushed (compressed) into a wedge shape. Most compression fractures happen in the middle or lower part of the spine. What are the causes? This condition may be caused by:  Thinning and loss of density in the bones (osteoporosis). This is the most common cause.  A fall.  A car or motorcycle accident.  Cancer.  Trauma, such as a heavy, direct hit to the head or back. What increases the risk? You are more likely to develop this condition if:  You are 52 years of age or older.  You have osteoporosis.  You have certain types of cancer, including: ? Multiple myeloma. ? Lymphoma. ? Prostate cancer. ? Lung cancer. ? Breast cancer. What are the signs or symptoms? Symptoms of this condition include:  Severe pain with simple movements such as coughing or sneezing.  Pain that gets worse over time.  Pain that is worse when you stand, walk, sit, or bend.  Sudden pain that is so bad that it is hard for you to move.  Bending or humping of the spine.  Gradual loss of height.  Numbness,  tingling, or weakness in the back and legs.  Trouble walking. Your symptoms will depend on the cause of the fracture and how quickly it develops. How is this diagnosed? This condition may be diagnosed based on symptoms, medical history, and a physical exam. During the physical exam, your health care provider may tap along the length of your spine to check for tenderness. Tests may be done to confirm the diagnosis. They may include:  A bone mineral density test to check for osteoporosis.  Imaging tests, such as a spine X-ray, CT scan, or MRI. How is this treated? Treatment depends on the cause and severity of the condition. Some fractures may heal on their own with supportive care. Treatment may include:  Pain  medicine.  Rest.  A back brace.  Physical therapy exercises.  Medicine to strengthen bone.  Calcium and vitamin D supplements. Fractures that cause the back to become misshapen, cause nerve pain or weakness, or do not respond to other treatment may be treated with surgery. This may include:  Vertebroplasty. Bone cement is injected into the collapsed vertebrae to stabilize them.  Balloon kyphoplasty. The collapsed vertebrae are expanded with a balloon and then bone cement is injected into them.  Spinal fusion. The collapsed vertebrae are connected (fused) to normal vertebrae. Follow these instructions at home: Medicines  Take over-the-counter and prescription medicines only as told by your health care provider.  Ask your health care provider if the medicine prescribed to you: ? Requires you to avoid driving or using machinery. ? Can cause constipation. You may need to take these actions to prevent or treat constipation:  Drink enough fluid to keep your urine pale yellow.  Take over-the-counter or prescription medicines.  Eat foods that are high in fiber, such as beans, whole grains, and fresh fruits and vegetables.  Limit foods that are high in fat and processed sugars, such as fried or sweet foods. If you have a brace:  Wear the brace as told by your health care provider. Remove it only as told by your health care provider.  Loosen the brace if your fingers or toes tingle, become numb, or turn cold and blue.  Keep the brace clean.  If the brace is not waterproof: ? Do not let it get wet. ? Cover it with a watertight covering when you take a bath or a shower. Managing pain, stiffness, and swelling  If directed, put ice on the injured area. To do this: ? If you have a removable brace, remove it as told by your health care provider. ? Put ice in a plastic bag. ? Place a towel between your skin and the bag. ? Leave the ice on for 20 minutes, 2-3 times a day. ? Remove  the ice if your skin turns bright red. This is very important. If you cannot feel pain, heat, or cold, you have a greater risk of damage to the area.   Activity  Rest as told by your health care provider.  Avoid sitting for a long time without moving. Get up to take short walks every 1-2 hours. This is important to improve blood flow and breathing. Ask for help if you feel weak or unsteady.  Return to your normal activities as told by your health care provider. Ask what activities are safe for you.  Do physical therapy exercises to improve movement and strength in your back, as recommended by your health care provider.  Exercise regularly as directed by your health care provider. General  instructions  Do not drink alcohol. Alcohol can interfere with your treatment.  Do not use any products that contain nicotine or tobacco, such as cigarettes, e-cigarettes, and chewing tobacco. These can delay bone healing. If you need help quitting, ask your health care provider.  Keep all follow-up visits. This is important. It can help to prevent permanent injury, disability, and long-lasting (chronic) pain.   Contact a health care provider if:  You have a fever.  Your pain medicine is not helping.  Your pain does not get better over time.  You cannot return to your normal activities as planned or expected. Get help right away if:  Your pain is very bad and it suddenly gets worse.  You are unable to move any body part (paralysis) that is below the level of your injury.  You have numbness, tingling, or weakness in any body part that is below the level of your injury.  You cannot control your bladder or bowels. Summary  A spinal compression fracture is a collapse of the bones that form the spine (vertebrae).  With this type of fracture, the vertebrae become pushed (compressed) into a wedge shape.  Your symptoms and treatment will depend on the cause and severity of the fracture and how  quickly it develops.  Some fractures may heal on their own with supportive care. Fractures that cause the back to become misshapen, cause nerve pain or weakness, or do not respond to other treatment may be treated with surgery. This information is not intended to replace advice given to you by your health care provider. Make sure you discuss any questions you have with your health care provider. Document Revised: 01/21/2020 Document Reviewed: 01/21/2020 Elsevier Patient Education  [place]. w concerning symptom, return to ER for reassessment.

## 2020-12-27 ENCOUNTER — Telehealth: Payer: Self-pay | Admitting: *Deleted

## 2020-12-27 NOTE — Telephone Encounter (Signed)
Transition Care Management Follow-up Telephone Call  Date of discharge and from where: 12/26/2020 - [facility]  How have you been since you were released from the hospital? "Not feeling good this morning but better than I was"  Any questions or concerns? No  Items Reviewed:  Did the pt receive and understand the discharge instructions provided? Yes   Medications obtained and verified? Yes   Other? No   Any new allergies since your discharge? No   Dietary orders reviewed? Yes  Do you have support at home? Yes   Home Care and Equipment/Supplies: Were home health services ordered? not applicable If so, what is the name of the agency? N/A  Has the agency set up a time to come to the patient's home? not applicable Were any new equipment or medical supplies ordered?  No What is the name of the medical supply agency? N/A Were you able to get the supplies/equipment? not applicable Do you have any questions related to the use of the equipment or supplies? No  Functional Questionnaire: (I = Independent and D = Dependent) ADLs: I  Bathing/Dressing- I  Meal Prep- I  Eating- I  Maintaining continence- I  Transferring/Ambulation- I  Managing Meds- I  Follow up appointments reviewed:   PCP Hospital f/u appt confirmed? Yes  Scheduled to see Thad Ranger, NP on 03/03/2021 @ 1340.  Specialist Hospital f/u appt confirmed? Yes  Scheduled to see Endocrinology on 01/05/2021 @ 0850.  Are transportation arrangements needed? No   If their condition worsens, is the pt aware to call PCP or go to the Emergency Dept.? Yes  Was the patient provided with contact information for the PCP's office or ED? Yes  Was to pt encouraged to call back with questions or concerns? Yes

## 2020-12-30 ENCOUNTER — Other Ambulatory Visit: Payer: Self-pay | Admitting: Obstetrics and Gynecology

## 2020-12-30 DIAGNOSIS — N939 Abnormal uterine and vaginal bleeding, unspecified: Secondary | ICD-10-CM

## 2020-12-31 ENCOUNTER — Ambulatory Visit: Payer: Medicaid Other | Admitting: Cardiology

## 2020-12-31 ENCOUNTER — Other Ambulatory Visit: Payer: Self-pay

## 2020-12-31 ENCOUNTER — Encounter: Payer: Self-pay | Admitting: Cardiology

## 2020-12-31 VITALS — BP 141/62 | HR 80 | Temp 97.8°F | Resp 16 | Ht 64.0 in | Wt 265.0 lb

## 2020-12-31 DIAGNOSIS — R079 Chest pain, unspecified: Secondary | ICD-10-CM | POA: Insufficient documentation

## 2020-12-31 DIAGNOSIS — I10 Essential (primary) hypertension: Secondary | ICD-10-CM

## 2020-12-31 NOTE — Progress Notes (Signed)
Patient referred by Vevelyn Francois, NP for congestive heart failure  Subjective:   Jill Shaw, female    DOB: 04/02/1976, 45 y.o.   MRN: [id number]   Chief Complaint  Patient presents with  . Chest Pain  . Follow-up  . Results    LEXISCAN  . Concussion    12/10/2020     HPI  45 year old African-American female with hypertension, type 2 diabetes mellitus, HFpEF, COPD, tobacco dependence, morbid obesity, microcytic anemia, uterine dysfunction, recurrent syncope  Patient was hospitalized in 12/2020 with gastroparesis, found to have acute T11-L2 compression fracture, altered mental status of unclear etiology. She did not undergo utpatient stress testing as previously recommended. She continues to have episodes of exertional chest pain. Other issues include anemia with GU blood loss.  Current Outpatient Medications on File Prior to Visit  Medication Sig Dispense Refill  . albuterol (PROVENTIL) (2.5 MG/3ML) 0.083% nebulizer solution Take 3 mLs (2.5 mg total) by nebulization every 6 (six) hours as needed for wheezing or shortness of breath. 150 mL 6  . albuterol (VENTOLIN HFA) 108 (90 Base) MCG/ACT inhaler Inhale 2 puffs into the lungs every 6 (six) hours as needed for wheezing or shortness of breath. 8 g 6  . amitriptyline (ELAVIL) 75 MG tablet Take 75 mg by mouth daily.    . blood glucose meter kit and supplies KIT 1 each by Other route See admin instructions. Dispense based on patient and insurance preference. Use up to four times daily as directed. (FOR ICD-9 250.00, 250.01). 1 each 2  . budesonide-formoterol (SYMBICORT) 160-4.5 MCG/ACT inhaler INHALE 2 PUFFS INTO THE LUNGS 2 (TWO) TIMES DAILY. (Patient taking differently: Inhale 2 puffs into the lungs 2 (two) times daily.) 10.2 g 6  . buPROPion (WELLBUTRIN SR) 150 MG 12 hr tablet Take 1 tablet (150 mg total) by mouth 2 (two) times daily. 60 tablet 11  . diclofenac Sodium (VOLTAREN) 1 % GEL Apply 4 g topically 4 (four) times  daily as needed (pain).    . ferrous sulfate 325 (65 FE) MG tablet Take 1 tablet (325 mg total) by mouth 3 (three) times daily with meals. 270 tablet 3  . fluticasone (FLONASE) 50 MCG/ACT nasal spray Place 2 sprays into both nostrils daily. 16 g 6  . furosemide (LASIX) 40 MG tablet Take 1 tablet (40 mg total) by mouth 2 (two) times daily. 60 tablet 5  . Glucosamine Sulfate 1000 MG CAPS Take 1 capsule (1,000 mg total) by mouth 2 (two) times daily. (Patient taking differently: Take 1,000 mg by mouth 2 (two) times daily.) 180 capsule 3  . hydrALAZINE (APRESOLINE) 50 MG tablet Take 50 mg by mouth 3 (three) times daily. 90 tablet 0  . hydrocortisone 2.5 % cream Apply topically 2 (two) times daily. (Patient taking differently: Apply 1 application topically 2 (two) times daily.) 30 g 11  . hydroquinone 4 % cream APPLY TOPICALLY TWICE DAILY 28.35 g 0  . Insulin Lispro Prot & Lispro (HUMALOG MIX 75/25 KWIKPEN) (75-25) 100 UNIT/ML Kwikpen 110 units every 12 hours 144 mL 11  . Insulin Pen Needle (PEN NEEDLES) 30G X 5 MM MISC 1 Units by Does not apply route as directed. 100 each 11  . lidocaine (LIDODERM) 5 % Place 1 patch onto the skin daily. Remove & Discard patch within 12 hours or as directed by MD 30 patch 0  . losartan (COZAAR) 50 MG tablet Take 1 tablet (50 mg total) by mouth daily. TAKE 1 TABLET($RemoveBefor'50MG'FEKPAewkgwtq$  TOTAL)  BY MOUTH EVERY DAY 90 tablet 3  . meloxicam (MOBIC) 7.5 MG tablet Take 1 tablet (7.5 mg total) by mouth daily. 30 tablet 0  . methocarbamol (ROBAXIN) 500 MG tablet Take 500-1,000 mg by mouth every 6 (six) hours as needed for muscle spasms.    . metoprolol succinate (TOPROL-XL) 200 MG 24 hr tablet Take 1 tablet (200 mg total) by mouth daily. Take with or immediately following a meal. (Patient taking differently: Take 200 mg by mouth daily.) 90 tablet 2  . nicotine (NICODERM CQ) 21 mg/24hr patch Place 1 patch (21 mg total) onto the skin daily. 28 patch 3  . norethindrone (AYGESTIN) 5 MG tablet Take 2  tablets (10 mg total) by mouth in the morning, at noon, in the evening, and at bedtime. With bleeding 120 tablet 0  . ondansetron (ZOFRAN ODT) 4 MG disintegrating tablet Take 1 tablet (4 mg total) by mouth every 8 (eight) hours as needed for nausea or vomiting. 20 tablet 0  . pantoprazole (PROTONIX) 40 MG tablet Take 1 tablet (40 mg total) by mouth daily. 90 tablet 3  . potassium chloride SA (KLOR-CON) 20 MEQ tablet Take 20 mEq by mouth daily.    Marland Kitchen rOPINIRole (REQUIP) 0.25 MG tablet Take 1 tablet (0.25 mg total) by mouth 3 (three) times daily. 90 tablet 2  . rosuvastatin (CRESTOR) 5 MG tablet Take 1 tablet (5 mg total) by mouth daily. 90 tablet 3  . saxagliptin HCl (ONGLYZA) 5 MG TABS tablet Take 1 tablet (5 mg total) by mouth daily. 90 tablet 3  . spironolactone (ALDACTONE) 50 MG tablet TAKE 1 TABLET(50 MG) BY MOUTH DAILY (Patient taking differently: Take 50 mg by mouth daily.) 30 tablet 3  . Tiotropium Bromide Monohydrate (SPIRIVA RESPIMAT) 2.5 MCG/ACT AERS Inhale 2 puffs into the lungs daily. 4 g 6  . tiZANidine (ZANAFLEX) 4 MG capsule Take 1 capsule (4 mg total) by mouth 3 (three) times daily. Do not drive while taking this medication. 90 capsule 11  . Turmeric 500 MG CAPS Take 500 mg by mouth 2 (two) times daily. 180 capsule 3  . Vitamin D, Ergocalciferol, (DRISDOL) 1.25 MG (50000 UNIT) CAPS capsule Take 1 capsule (50,000 Units total) by mouth every 7 (seven) days. (Patient taking differently: Take 50,000 Units by mouth every Friday.) 5 capsule 6  . ACCU-CHEK GUIDE test strip USE AS DIRECTED UP TO FOUR TIMES DAILY 100 strip 4   No current facility-administered medications on file prior to visit.    Cardiovascular and other pertinent studies:  EKG 10/14/2020: Sinus tachycardia 111 bpm  Cannot exclude old anteroseptal infarct  EKG 09/01/2020: Sinus rhythm 85 bpm  Poor R wave progression Otherwise normal EKG  Vascular US 07/16/2020: No DVT  Echocardiogram 04/30/2020: 1. Left  ventricular ejection fraction, by estimation, is 55 to 60%. The  left ventricle has normal function. The left ventricle has no regional  wall motion abnormalities. There is moderate concentric left ventricular  hypertrophy. Left ventricular  diastolic parameters are consistent with Grade I diastolic dysfunction  (impaired relaxation). Elevated left ventricular end-diastolic pressure.  2. Right ventricular systolic function is normal. The right ventricular  size is normal.  3. The mitral valve is normal in structure. Trivial mitral valve  regurgitation. No evidence of mitral stenosis.  4. The aortic valve is normal in structure. Aortic valve regurgitation is  not visualized. No aortic stenosis is present.  5. The inferior vena cava is dilated in size with <50% respiratory  variability, suggesting right  atrial pressure of 15 mmHg.    Recent labs: 12/26/2020: Glucose 243, BUN/Cr 10/1.17. EGFR 49. Na/K 134/4.6 Trop HS 5,4 H/H 9.8/32.3. MCV 74. Platelets 313 HbA1C 9.7%  07/26/2020: Glucose 160, BUN/Cr 10/1.13. EGFR 60. Na/K 140/4.0. Rest of the CMP normal H/H 9.5/34.7. MCV 70. Platelets 311 HbA1C 9.2% Chol 135, TG 166, HDL 29, LDL 77   Review of Systems  Cardiovascular: Positive for chest pain, dyspnea on exertion, leg swelling and orthopnea. Negative for palpitations and syncope.         Vitals:   12/31/20 1441 12/31/20 1443  BP:    Pulse:    Resp:    Temp:    SpO2: 99% 98%   Orthostatic VS for the past 72 hrs (Last 3 readings):  Orthostatic BP Patient Position BP Location Cuff Size Orthostatic Pulse  12/31/20 1443 117/49 Standing Left Arm Large 64  12/31/20 1441 118/50 Sitting Left Arm Large 83  12/31/20 1439 (!) 88/36 Supine Left Arm Large 83      Body mass index is 45.49 kg/m. Filed Weights   12/31/20 1433  Weight: 265 lb (120.2 kg)     Objective:   Physical Exam Physical Exam Vitals and nursing note reviewed.  Constitutional:      General: She is  not in acute distress.    Appearance: She is obese.  Neck:     Vascular: No JVD.  Cardiovascular:     Rate and Rhythm: Normal rate and regular rhythm.     Heart sounds: Normal heart sounds. No murmur heard.   Pulmonary:     Effort: Pulmonary effort is normal.     Breath sounds: Normal breath sounds. No wheezing or rales.  Musculoskeletal:     Right lower leg: No edema.     Left lower leg: No edema.           Assessment & Recommendations:   45 year old African-American female with hypertension, type 2 diabetes mellitus, HFpEF, COPD, tobacco dependence, morbid obesity, microcytic anemia, uterine dysfunction, recurrent syncope  Exertional chest pain, dyspnea: Multiple risk factors for CAD. Multiple possible etiologies, including hypertension, anemia, obesity.  Will obtain lexiscan nuclear stress test. Will need to be conservative about any invasive management recommendations given her underlying anemia. Recommend management as per PCP/OBGYN Continue current anti anginal therapy  Chronic heart failure with preserved ejection fraction (HCC) Currently euvolumic Likely related to hypertension, obesity, controlled type 2 diabetes mellitus.  Other differential for her dyspnea is microcytic anemia.  Defer management to PCP.  Continue current medical management, low salt diet  Hypertension Controlled   Controlled type 2 diabetes mellitus: Needs aggressive management. Defer to PCP.  F/u 6 months, unless significant abnormalities noted on stress test   Nigel Mormon, MD Pager: [phone] Office: [phone]

## 2021-01-03 ENCOUNTER — Other Ambulatory Visit: Payer: Self-pay | Admitting: Obstetrics and Gynecology

## 2021-01-03 NOTE — Patient Instructions (Signed)
Hi Jill Shaw we missed you today - as a part of your Medicaid benefit, you are eligible for care management and care coordination services at no cost or copay. I was unable to reach you by phone today but would be happy to help you with your health related needs. Please feel free to call me at [phone].  A member of the Managed Medicaid care management team will reach out to you again over the next 7 days.   Kathi Der RN, BSN [place]  [employer] - Managed Medicaid High Risk [phone].

## 2021-01-03 NOTE — Patient Outreach (Signed)
Care Coordination  01/03/2021  [name] [birth year] [id number]    Medicaid Managed Care   Unsuccessful Outreach Note  01/03/2021 Name: Jill Shaw MRN: [id number] DOB: 31-Oct-1975  Referred by: Barbette Merino, NP Reason for referral : High Risk Managed Medicaid (Unsuccessful telephone outreach)   A second unsuccessful telephone outreach was attempted today. The patient was referred to the case management team for assistance with care management and care coordination.   Follow Up Plan: A member of the Managed Medicaid care management team will reach out to the patient again over the next 7 days.   Kathi Der RN, BSN [place]  [employer] - Managed Medicaid High Risk [phone].

## 2021-01-04 ENCOUNTER — Encounter: Payer: Medicaid Other | Admitting: Physical Medicine and Rehabilitation

## 2021-01-05 ENCOUNTER — Ambulatory Visit: Payer: Medicaid Other | Admitting: Internal Medicine

## 2021-01-07 ENCOUNTER — Other Ambulatory Visit: Payer: Self-pay

## 2021-01-07 ENCOUNTER — Encounter: Payer: Self-pay | Admitting: Physical Medicine and Rehabilitation

## 2021-01-07 ENCOUNTER — Encounter
Payer: Medicaid Other | Attending: Physical Medicine and Rehabilitation | Admitting: Physical Medicine and Rehabilitation

## 2021-01-07 VITALS — BP 153/85 | HR 111 | Temp 98.3°F | Ht 64.0 in | Wt 273.6 lb

## 2021-01-07 DIAGNOSIS — M797 Fibromyalgia: Secondary | ICD-10-CM | POA: Diagnosis not present

## 2021-01-07 DIAGNOSIS — F5101 Primary insomnia: Secondary | ICD-10-CM | POA: Diagnosis not present

## 2021-01-07 DIAGNOSIS — N179 Acute kidney failure, unspecified: Secondary | ICD-10-CM | POA: Diagnosis not present

## 2021-01-07 DIAGNOSIS — M792 Neuralgia and neuritis, unspecified: Secondary | ICD-10-CM | POA: Diagnosis not present

## 2021-01-07 DIAGNOSIS — S22000A Wedge compression fracture of unspecified thoracic vertebra, initial encounter for closed fracture: Secondary | ICD-10-CM | POA: Diagnosis not present

## 2021-01-07 MED ORDER — LIDOCAINE 5 % EX PTCH: 1.0000 | MEDICATED_PATCH | CUTANEOUS | 0 refills | Status: DC

## 2021-01-07 MED ORDER — AMITRIPTYLINE HCL 150 MG PO TABS: 150.0000 mg | ORAL_TABLET | Freq: Every day | ORAL | 1 refills | Status: DC

## 2021-01-07 MED ORDER — VITAMIN E 180 MG (400 UNIT) PO CAPS: 400.0000 [IU] | ORAL_CAPSULE | Freq: Every day | ORAL | 1 refills | Status: DC
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
## 2021-01-07 NOTE — Patient Instructions (Signed)
Blue emu oil 

## 2021-01-08 ENCOUNTER — Other Ambulatory Visit: Payer: Self-pay

## 2021-01-08 ENCOUNTER — Emergency Department (HOSPITAL_COMMUNITY): Payer: Medicaid Other

## 2021-01-08 ENCOUNTER — Encounter (HOSPITAL_COMMUNITY): Payer: Self-pay

## 2021-01-08 ENCOUNTER — Inpatient Hospital Stay (HOSPITAL_COMMUNITY)
Admission: EM | Admit: 2021-01-08 | Discharge: 2021-01-12 | DRG: 917 | Disposition: A | Discharge status: Home Health | Payer: Medicaid Other | Attending: Internal Medicine | Admitting: Internal Medicine

## 2021-01-08 DIAGNOSIS — N3000 Acute cystitis without hematuria: Secondary | ICD-10-CM

## 2021-01-08 DIAGNOSIS — Z882 Allergy status to sulfonamides status: Secondary | ICD-10-CM

## 2021-01-08 DIAGNOSIS — K3184 Gastroparesis: Secondary | ICD-10-CM | POA: Diagnosis not present

## 2021-01-08 DIAGNOSIS — K219 Gastro-esophageal reflux disease without esophagitis: Secondary | ICD-10-CM | POA: Diagnosis present

## 2021-01-08 DIAGNOSIS — E1165 Type 2 diabetes mellitus with hyperglycemia: Secondary | ICD-10-CM | POA: Diagnosis not present

## 2021-01-08 DIAGNOSIS — I110 Hypertensive heart disease with heart failure: Secondary | ICD-10-CM | POA: Diagnosis not present

## 2021-01-08 DIAGNOSIS — E874 Mixed disorder of acid-base balance: Secondary | ICD-10-CM | POA: Diagnosis present

## 2021-01-08 DIAGNOSIS — T43014A Poisoning by tricyclic antidepressants, undetermined, initial encounter: Secondary | ICD-10-CM | POA: Diagnosis not present

## 2021-01-08 DIAGNOSIS — G894 Chronic pain syndrome: Secondary | ICD-10-CM | POA: Diagnosis present

## 2021-01-08 DIAGNOSIS — N179 Acute kidney failure, unspecified: Secondary | ICD-10-CM | POA: Diagnosis not present

## 2021-01-08 DIAGNOSIS — T6594XA Toxic effect of unspecified substance, undetermined, initial encounter: Secondary | ICD-10-CM | POA: Diagnosis not present

## 2021-01-08 DIAGNOSIS — Z7951 Long term (current) use of inhaled steroids: Secondary | ICD-10-CM

## 2021-01-08 DIAGNOSIS — G4700 Insomnia, unspecified: Secondary | ICD-10-CM | POA: Diagnosis present

## 2021-01-08 DIAGNOSIS — E872 Acidosis, unspecified: Secondary | ICD-10-CM | POA: Diagnosis present

## 2021-01-08 DIAGNOSIS — Z886 Allergy status to analgesic agent status: Secondary | ICD-10-CM

## 2021-01-08 DIAGNOSIS — R0902 Hypoxemia: Secondary | ICD-10-CM | POA: Diagnosis not present

## 2021-01-08 DIAGNOSIS — R6510 Systemic inflammatory response syndrome (SIRS) of non-infectious origin without acute organ dysfunction: Secondary | ICD-10-CM

## 2021-01-08 DIAGNOSIS — R109 Unspecified abdominal pain: Secondary | ICD-10-CM

## 2021-01-08 DIAGNOSIS — Z888 Allergy status to other drugs, medicaments and biological substances status: Secondary | ICD-10-CM

## 2021-01-08 DIAGNOSIS — G9341 Metabolic encephalopathy: Secondary | ICD-10-CM | POA: Diagnosis present

## 2021-01-08 DIAGNOSIS — E1140 Type 2 diabetes mellitus with diabetic neuropathy, unspecified: Secondary | ICD-10-CM | POA: Diagnosis not present

## 2021-01-08 DIAGNOSIS — R739 Hyperglycemia, unspecified: Secondary | ICD-10-CM

## 2021-01-08 DIAGNOSIS — J449 Chronic obstructive pulmonary disease, unspecified: Secondary | ICD-10-CM | POA: Diagnosis not present

## 2021-01-08 DIAGNOSIS — I5032 Chronic diastolic (congestive) heart failure: Secondary | ICD-10-CM | POA: Diagnosis present

## 2021-01-08 DIAGNOSIS — Z20822 Contact with and (suspected) exposure to covid-19: Secondary | ICD-10-CM | POA: Diagnosis present

## 2021-01-08 DIAGNOSIS — F32A Depression, unspecified: Secondary | ICD-10-CM | POA: Diagnosis present

## 2021-01-08 DIAGNOSIS — Z833 Family history of diabetes mellitus: Secondary | ICD-10-CM

## 2021-01-08 DIAGNOSIS — R000 Tachycardia, unspecified: Secondary | ICD-10-CM | POA: Diagnosis not present

## 2021-01-08 DIAGNOSIS — G928 Other toxic encephalopathy: Secondary | ICD-10-CM | POA: Diagnosis present

## 2021-01-08 DIAGNOSIS — I10 Essential (primary) hypertension: Secondary | ICD-10-CM | POA: Diagnosis present

## 2021-01-08 DIAGNOSIS — E1143 Type 2 diabetes mellitus with diabetic autonomic (poly)neuropathy: Secondary | ICD-10-CM | POA: Diagnosis present

## 2021-01-08 DIAGNOSIS — Z6841 Body Mass Index (BMI) 40.0 and over, adult: Secondary | ICD-10-CM

## 2021-01-08 DIAGNOSIS — Z635 Disruption of family by separation and divorce: Secondary | ICD-10-CM

## 2021-01-08 DIAGNOSIS — G4733 Obstructive sleep apnea (adult) (pediatric): Secondary | ICD-10-CM | POA: Diagnosis present

## 2021-01-08 DIAGNOSIS — D573 Sickle-cell trait: Secondary | ICD-10-CM | POA: Diagnosis present

## 2021-01-08 DIAGNOSIS — Z885 Allergy status to narcotic agent status: Secondary | ICD-10-CM

## 2021-01-08 DIAGNOSIS — G8929 Other chronic pain: Secondary | ICD-10-CM | POA: Diagnosis present

## 2021-01-08 DIAGNOSIS — T43011A Poisoning by tricyclic antidepressants, accidental (unintentional), initial encounter: Secondary | ICD-10-CM | POA: Diagnosis not present

## 2021-01-08 DIAGNOSIS — M4856XA Collapsed vertebra, not elsewhere classified, lumbar region, initial encounter for fracture: Secondary | ICD-10-CM | POA: Diagnosis present

## 2021-01-08 DIAGNOSIS — Z87891 Personal history of nicotine dependence: Secondary | ICD-10-CM

## 2021-01-08 DIAGNOSIS — R404 Transient alteration of awareness: Secondary | ICD-10-CM | POA: Diagnosis not present

## 2021-01-08 DIAGNOSIS — E785 Hyperlipidemia, unspecified: Secondary | ICD-10-CM | POA: Diagnosis present

## 2021-01-08 DIAGNOSIS — K5900 Constipation, unspecified: Secondary | ICD-10-CM | POA: Diagnosis not present

## 2021-01-08 DIAGNOSIS — R4182 Altered mental status, unspecified: Secondary | ICD-10-CM

## 2021-01-08 DIAGNOSIS — D509 Iron deficiency anemia, unspecified: Secondary | ICD-10-CM | POA: Diagnosis not present

## 2021-01-08 DIAGNOSIS — T50904A Poisoning by unspecified drugs, medicaments and biological substances, undetermined, initial encounter: Secondary | ICD-10-CM

## 2021-01-08 DIAGNOSIS — D649 Anemia, unspecified: Secondary | ICD-10-CM | POA: Diagnosis not present

## 2021-01-08 DIAGNOSIS — Z8616 Personal history of COVID-19: Secondary | ICD-10-CM

## 2021-01-08 DIAGNOSIS — E66813 Obesity, class 3: Secondary | ICD-10-CM

## 2021-01-08 DIAGNOSIS — Z8249 Family history of ischemic heart disease and other diseases of the circulatory system: Secondary | ICD-10-CM

## 2021-01-08 DIAGNOSIS — R410 Disorientation, unspecified: Secondary | ICD-10-CM | POA: Diagnosis not present

## 2021-01-08 DIAGNOSIS — Z79899 Other long term (current) drug therapy: Secondary | ICD-10-CM

## 2021-01-08 DIAGNOSIS — Z794 Long term (current) use of insulin: Secondary | ICD-10-CM

## 2021-01-08 LAB — RAPID URINE DRUG SCREEN, HOSP PERFORMED
Amphetamines: NOT DETECTED
Barbiturates: NOT DETECTED
Benzodiazepines: NOT DETECTED
Cocaine: NOT DETECTED
Opiates: NOT DETECTED
Tetrahydrocannabinol: NOT DETECTED

## 2021-01-08 LAB — I-STAT VENOUS BLOOD GAS, ED
Acid-Base Excess: 3 mmol/L — ABNORMAL HIGH (ref 0.0–2.0)
Bicarbonate: 26 mmol/L (ref 20.0–28.0)
Calcium, Ion: 1.1 mmol/L — ABNORMAL LOW (ref 1.15–1.40)
HCT: 34 % — ABNORMAL LOW (ref 36.0–46.0)
Hemoglobin: 11.6 g/dL — ABNORMAL LOW (ref 12.0–15.0)
O2 Saturation: 98 %
Potassium: 3.9 mmol/L (ref 3.5–5.1)
Sodium: 137 mmol/L (ref 135–145)
TCO2: 27 mmol/L (ref 22–32)
pCO2, Ven: 32.3 mmHg — ABNORMAL LOW (ref 44.0–60.0)
pH, Ven: 7.513 — ABNORMAL HIGH (ref 7.250–7.430)
pO2, Ven: 99 mmHg — ABNORMAL HIGH (ref 32.0–45.0)

## 2021-01-08 LAB — CBC WITH DIFFERENTIAL/PLATELET
Abs Immature Granulocytes: 0.04 10*3/uL (ref 0.00–0.07)
Basophils Absolute: 0.1 10*3/uL (ref 0.0–0.1)
Basophils Relative: 1 %
Eosinophils Absolute: 0.2 10*3/uL (ref 0.0–0.5)
Eosinophils Relative: 2 %
HCT: 32.4 % — ABNORMAL LOW (ref 36.0–46.0)
Hemoglobin: 9.7 g/dL — ABNORMAL LOW (ref 12.0–15.0)
Immature Granulocytes: 0 %
Lymphocytes Relative: 22 %
Lymphs Abs: 2.3 10*3/uL (ref 0.7–4.0)
MCH: 21.8 pg — ABNORMAL LOW (ref 26.0–34.0)
MCHC: 29.9 g/dL — ABNORMAL LOW (ref 30.0–36.0)
MCV: 73 fL — ABNORMAL LOW (ref 80.0–100.0)
Monocytes Absolute: 0.7 10*3/uL (ref 0.1–1.0)
Monocytes Relative: 7 %
Neutro Abs: 7 10*3/uL (ref 1.7–7.7)
Neutrophils Relative %: 68 %
Platelets: 263 10*3/uL (ref 150–400)
RBC: 4.44 MIL/uL (ref 3.87–5.11)
RDW: 24 % — ABNORMAL HIGH (ref 11.5–15.5)
WBC: 10.2 10*3/uL (ref 4.0–10.5)
nRBC: 0 % (ref 0.0–0.2)

## 2021-01-08 LAB — COMPREHENSIVE METABOLIC PANEL
ALT: 12 U/L (ref 0–44)
AST: 26 U/L (ref 15–41)
Albumin: 3.4 g/dL — ABNORMAL LOW (ref 3.5–5.0)
Alkaline Phosphatase: 77 U/L (ref 38–126)
Anion gap: 12 (ref 5–15)
BUN: 15 mg/dL (ref 6–20)
CO2: 24 mmol/L (ref 22–32)
Calcium: 9.2 mg/dL (ref 8.9–10.3)
Chloride: 99 mmol/L (ref 98–111)
Creatinine, Ser: 1.61 mg/dL — ABNORMAL HIGH (ref 0.44–1.00)
GFR, Estimated: 40 mL/min — ABNORMAL LOW (ref >60)
Glucose, Bld: 404 mg/dL — ABNORMAL HIGH (ref 70–99)
Potassium: 3.8 mmol/L (ref 3.5–5.1)
Sodium: 135 mmol/L (ref 135–145)
Total Bilirubin: 0.8 mg/dL (ref 0.3–1.2)
Total Protein: 6.9 g/dL (ref 6.5–8.1)

## 2021-01-08 LAB — BETA-HYDROXYBUTYRIC ACID: Beta-Hydroxybutyric Acid: 0.92 mmol/L — ABNORMAL HIGH (ref 0.05–0.27)

## 2021-01-08 LAB — URINALYSIS, ROUTINE W REFLEX MICROSCOPIC

## 2021-01-08 LAB — RESP PANEL BY RT-PCR (FLU A&B, COVID) ARPGX2
Influenza A by PCR: NEGATIVE
Influenza B by PCR: NEGATIVE
SARS Coronavirus 2 by RT PCR: NEGATIVE

## 2021-01-08 LAB — URINALYSIS, MICROSCOPIC (REFLEX)
Bacteria, UA: NONE SEEN
RBC / HPF: >50 RBC/hpf (ref 0–5)
Squamous Epithelial / HPF: NONE SEEN (ref 0–5)

## 2021-01-08 LAB — LACTIC ACID, PLASMA
Lactic Acid, Venous: 2.6 mmol/L (ref 0.5–1.9)
Lactic Acid, Venous: 2.2 mmol/L (ref 0.5–1.9)

## 2021-01-08 LAB — ETHANOL: Alcohol, Ethyl (B): <10 mg/dL (ref <10)

## 2021-01-08 LAB — I-STAT BETA HCG BLOOD, ED (MC, WL, AP ONLY): I-stat hCG, quantitative: <5 m[IU]/mL (ref <5)

## 2021-01-08 LAB — SALICYLATE LEVEL: Salicylate Lvl: <7 mg/dL — ABNORMAL LOW (ref 7.0–30.0)

## 2021-01-08 LAB — LIPASE, BLOOD: Lipase: 22 U/L (ref 11–51)

## 2021-01-08 LAB — TROPONIN I (HIGH SENSITIVITY)
Troponin I (High Sensitivity): 12 ng/L (ref <18)
Troponin I (High Sensitivity): 11 ng/L (ref <18)

## 2021-01-08 LAB — ACETAMINOPHEN LEVEL: Acetaminophen (Tylenol), Serum: <10 ug/mL — ABNORMAL LOW (ref 10–30)

## 2021-01-08 LAB — GLUCOSE, CAPILLARY: Glucose-Capillary: 356 mg/dL — ABNORMAL HIGH (ref 70–99)

## 2021-01-08 MED ORDER — INSULIN ASPART 100 UNIT/ML ~~LOC~~ SOLN
0.0000 [IU] | Freq: Three times a day (TID) | SUBCUTANEOUS | Status: DC
  Administered 2021-01-09 (×2): 4 [IU] via SUBCUTANEOUS
  Administered 2021-01-09: 15 [IU] via SUBCUTANEOUS
  Administered 2021-01-10: 3 [IU] via SUBCUTANEOUS
  Administered 2021-01-10: 4 [IU] via SUBCUTANEOUS
  Administered 2021-01-10: 7 [IU] via SUBCUTANEOUS
  Administered 2021-01-11: 15 [IU] via SUBCUTANEOUS
  Administered 2021-01-11 (×2): 4 [IU] via SUBCUTANEOUS
  Administered 2021-01-12: 3 [IU] via SUBCUTANEOUS

## 2021-01-08 MED ORDER — ACETAMINOPHEN 325 MG PO TABS
650.0000 mg | ORAL_TABLET | Freq: Four times a day (QID) | ORAL | Status: DC | PRN
  Administered 2021-01-08: 650 mg via ORAL
  Filled 2021-01-08: qty 2

## 2021-01-08 MED ORDER — ENOXAPARIN SODIUM 60 MG/0.6ML ~~LOC~~ SOLN
60.0000 mg | SUBCUTANEOUS | Status: DC
  Administered 2021-01-08 – 2021-01-10 (×3): 60 mg via SUBCUTANEOUS
  Filled 2021-01-08 (×4): qty 0.6

## 2021-01-08 MED ORDER — SODIUM CHLORIDE 0.9 % IV BOLUS
1000.0000 mL | Freq: Once | INTRAVENOUS | Status: AC
  Administered 2021-01-08: 1000 mL via INTRAVENOUS

## 2021-01-08 MED ORDER — ONDANSETRON HCL 4 MG/2ML IJ SOLN: 4.0000 mg | Freq: Four times a day (QID) | INTRAMUSCULAR | Status: DC | PRN

## 2021-01-08 MED ORDER — MORPHINE SULFATE (PF) 2 MG/ML IV SOLN
2.0000 mg | INTRAVENOUS | Status: DC | PRN
  Administered 2021-01-09: 2 mg via INTRAVENOUS
  Filled 2021-01-08: qty 1

## 2021-01-08 MED ORDER — LABETALOL HCL 5 MG/ML IV SOLN
10.0000 mg | INTRAVENOUS | Status: DC | PRN
  Administered 2021-01-09 (×2): 10 mg via INTRAVENOUS
  Filled 2021-01-08 (×2): qty 4

## 2021-01-08 MED ORDER — ACETAMINOPHEN 650 MG RE SUPP: 650.0000 mg | Freq: Four times a day (QID) | RECTAL | Status: DC | PRN

## 2021-01-08 MED ORDER — HYDROCODONE-ACETAMINOPHEN 5-325 MG PO TABS: 1.0000 | ORAL_TABLET | ORAL | Status: DC | PRN

## 2021-01-08 MED ORDER — ONDANSETRON HCL 4 MG PO TABS
4.0000 mg | ORAL_TABLET | Freq: Four times a day (QID) | ORAL | Status: DC | PRN
  Administered 2021-01-10: 4 mg via ORAL
  Filled 2021-01-08: qty 1

## 2021-01-08 MED ORDER — SODIUM CHLORIDE 0.9 % IV SOLN: INTRAVENOUS | Status: DC

## 2021-01-08 MED ORDER — INSULIN ASPART 100 UNIT/ML ~~LOC~~ SOLN
0.0000 [IU] | Freq: Every day | SUBCUTANEOUS | Status: DC
  Administered 2021-01-08: 5 [IU] via SUBCUTANEOUS
  Administered 2021-01-11: 2 [IU] via SUBCUTANEOUS

## 2021-01-08 NOTE — ED Notes (Signed)
Called lab to have acetaminophen and salicylate levels added on to dark green top that was sent down. Per lab, they will add labs.
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
## 2021-01-08 NOTE — ED Notes (Signed)
Pt's mother reports pt had amitriptyline filled on 01-07-21 for a quantity of 30. Pt is supposed to take 1 daily at bedtime. The count should be 29 and there are 21 pills in the bottle. Pt's mother also reports pt had tizanidine filled on 01-03-21 for a quantity of 90 and is supposed to take it 3 times daily. The count should be 75 and the count is 15. Notified provider.

## 2021-01-08 NOTE — ED Provider Notes (Signed)
[facility] EMERGENCY DEPARTMENT Provider Note   CSN: [id number] Arrival date & time: 01/08/21  1753     History Chief Complaint  Patient presents with  . Altered Mental Status    Jill Shaw is a 45 y.o. female.  HPI      45 year old female with history of COPD, diabetes, chronic diastolic congestive heart failure, hypertension, seizures related to stress and blood sugar, sickle cell trait, dysfunctional uterine bleeding, chronic pain, recent admission after trauma to [facility] 11/2020 and admission 3/10 to our facility who presents with concern for altered mental status with family concerned she may have overdosed on amitriptyline and tizanidine.  Family reports that she was last seen normal at 8 PM last night, however when she woke up this morning, she was confused and not acting herself.  They report she was nauseous and had an episode of vomiting.  She appeared to be sleepy, confused, and "not acting right".  EMS reported that she was sitting on the bathtub but thought she was sitting on the toilet.  She just had tizanidine prescription filled on March 21 for 90 tablets, and only had 15 tablets left in the bottle, and also had an amitriptyline bottle that was filled yesterday that was missing 8 tablets of the expected number.  They are concerned that her pill bottles are this empty with such recent refills.  Her mother reports that she had not reported any suicidal ideation, but that she has been under significant stress recently with her chronic pain and disability.  She does not have fevers, diarrhea, black or bloody stools.  They do not see focal numbness or weakness.  She has a history of depression, but has not had any known suicide attempts. Unclear time of ingestion.    Past Medical History:  Diagnosis Date  . Anemia   . Arthritis    knees, hands  . Asthma   . Chronic diastolic (congestive) heart failure ([place])   . COPD (chronic obstructive pulmonary  disease) ([place])   . Diabetes mellitus without complication ([place])    type 2  . Dysfunctional uterine bleeding   . GERD (gastroesophageal reflux disease)   . Hypertension   . Neuromuscular disorder (HCC)    neuropathy feet  . Seizures ([place]) 09/12/2017   pt states r/t stress and blood sugar - no meds last one 4 months ago, not seen neurologist  . Sickle cell trait ([place])   . Smoker   . Vitamin D deficiency 10/2019  . Wears glasses     Patient Active Problem List   Diagnosis Date Noted  . Ingestion of substance, undetermined intent, initial encounter 01/08/2021  . Hyperglycemia due to type 2 diabetes mellitus ([place]) 01/08/2021  . SIRS (systemic inflammatory response syndrome) ([place]) 01/08/2021  . Exertional chest pain 12/31/2020  . SOB (shortness of breath) 12/24/2020  . DKA (diabetic ketoacidosis) ([place]) 10/15/2020  . Hyperkalemia 10/15/2020  . Severe sepsis ([place]) 10/15/2020  . Acute metabolic encephalopathy 63/10/6008  . Sinus tachycardia 10/14/2020  . Precordial pain 10/14/2020  . Abnormal findings on diagnostic imaging of lung 08/19/2020  . Physical deconditioning 08/19/2020  . History of endometrial ablation 07/27/2020  . History of alcohol use 07/20/2020  . Current moderate episode of major depressive disorder without prior episode ([place]) 07/20/2020  . Sickle cell trait ([place]) 07/19/2020  . Elevated d-dimer 07/13/2020  . Healthcare maintenance 07/12/2020  . Exertional dyspnea 07/12/2020  . Atypical chest pain 07/12/2020  . At risk for obstructive sleep  apnea 07/12/2020  . DKA (diabetic ketoacidoses) 07/02/2020  . Acute on chronic diastolic (congestive) heart failure ([place]) 06/22/2020  . Abdominal pain 03/08/2020  . Chronic diastolic CHF (congestive heart failure) ([place]) 03/08/2020  . AKI (acute kidney injury) ([place]) 01/29/2020  . Chronic anemia 12/19/2019  . Elevated sed rate 11/26/2019  . Elevated C-reactive protein (CRP) 11/26/2019  . Hypoglycemia 02/07/2019  . Hypotension  02/07/2019  . Lactic acidosis 02/07/2019  . Right ankle pain 02/07/2019  . Chronic obstructive pulmonary disease ([place]) 02/05/2019  . Seizure ([place]) 07/31/2018  . Vitamin D deficiency 05/31/2017  . Gastroesophageal reflux disease 05/24/2017  . Abnormal uterine bleeding (AUB) 05/17/2017  . Anemia of chronic disease 04/06/2017  . Knee pain, chronic 03/27/2016  . Controlled type 2 diabetes mellitus without complication, with long-term current use of insulin ([place]) 03/27/2016  . Essential hypertension 03/27/2016  . Obesity, Class III, BMI 40-49.9 (morbid obesity) ([place]) 03/27/2016  . Irritable bowel syndrome with constipation 03/27/2016  . Tobacco dependence 03/27/2016  . CKD (chronic kidney disease) stage 2, GFR 60-89 ml/min 03/25/2016  . Arthropathy, lower leg 05/04/2013  . CTS (carpal tunnel syndrome) 05/04/2013  . Peripheral edema 05/04/2013  . Chronic pain 05/13/2012  . Diabetic gastroparesis ([place]) 11/06/2006  . Hot flashes 11/06/2006    Past Surgical History:  Procedure Laterality Date  . CESAREAN SECTION     x 1. for twins  . DILATION AND CURETTAGE OF UTERUS N/A 08/20/2019   Procedure: DILATATION AND CURETTAGE;  Surgeon: Emily Filbert, MD;  Location: [facility];  Service: Gynecology;  Laterality: N/A;  . ENDOMETRIAL ABLATION N/A 08/20/2019   Procedure: [place] Ablation;  Surgeon: Emily Filbert, MD;  Location: [facility];  Service: Gynecology;  Laterality: N/A;  . EYE SURGERY Bilateral    laser right and cataract removed left eye  . RADIOLOGY WITH ANESTHESIA N/A 09/16/2019   Procedure: MRI WITH ANESTHESIA   L SPINE WITHOUT CONTRAST, T SPINE WITHOUT CONTRAST , CERVICAL WITHOUT CONTRAST;  Surgeon: Radiologist, Medication, MD;  Location: [facility];  Service: Radiology;  Laterality: N/A;  . TUBAL LIGATION     interval BTL  . UPPER GI ENDOSCOPY  07/2017     OB History    Gravida  3   Para      Term      Preterm      AB      Living  3     SAB      IAB      Ectopic      Multiple       Live Births  3        Obstetric Comments  Vaginal x 1 and then c-section for twins        Family History  Problem Relation Age of Onset  . Diabetes Mother   . Hypertension Mother     Social History   Tobacco Use  . Smoking status: Former Smoker    Packs/day: 0.25    Years: 26.00    Pack years: 6.50    Types: Cigarettes    Quit date: 09/13/2020    Years since quitting: 0.3  . Smokeless tobacco: Never Used  . Tobacco comment: 4-5 cigarettes/day  Vaping Use  . Vaping Use: Never used  Substance Use Topics  . Alcohol use: No  . Drug use: No    Home Medications Prior to Admission medications   Medication Sig Start Date End Date Taking? Authorizing Provider  amitriptyline (ELAVIL) 150 MG tablet Take 1  tablet (150 mg total) by mouth at bedtime. 01/07/21  Yes Raulkar, Clide Deutscher, MD  budesonide-formoterol (SYMBICORT) 160-4.5 MCG/ACT inhaler INHALE 2 PUFFS INTO THE LUNGS 2 (TWO) TIMES DAILY. Patient taking differently: Inhale 2 puffs into the lungs 2 (two) times daily. 09/27/20  Yes Icard, Octavio Graves, DO  buPROPion (WELLBUTRIN SR) 150 MG 12 hr tablet Take 1 tablet (150 mg total) by mouth 2 (two) times daily. 11/23/20 11/23/21 Yes Dahal, Marlowe Aschoff, MD  ferrous sulfate 325 (65 FE) MG tablet Take 1 tablet (325 mg total) by mouth 3 (three) times daily with meals. 12/02/20 12/02/21 Yes King, Diona Foley, NP  fluticasone (FLONASE) 50 MCG/ACT nasal spray Place 2 sprays into both nostrils daily. 12/02/20  Yes Vevelyn Francois, NP  furosemide (LASIX) 40 MG tablet Take 1 tablet (40 mg total) by mouth 2 (two) times daily. 08/11/20  Yes Lorretta Harp, MD  Glucosamine Sulfate 1000 MG CAPS Take 1 capsule (1,000 mg total) by mouth 2 (two) times daily. Patient taking differently: Take 1,000 mg by mouth 2 (two) times daily. 08/22/19  Yes Hilts, Legrand Como, MD  hydrALAZINE (APRESOLINE) 50 MG tablet Take 50 mg by mouth 3 (three) times daily. 12/02/20  Yes [provider]  Insulin Lispro Prot & Lispro  (HUMALOG MIX 75/25 KWIKPEN) (75-25) 100 UNIT/ML Kwikpen 110 units every 12 hours 12/02/20  Yes King, Diona Foley, NP  lidocaine (LIDODERM) 5 % Place 1 patch onto the skin daily. Remove & Discard patch within 12 hours or as directed by MD 01/07/21  Yes Raulkar, Clide Deutscher, MD  losartan (COZAAR) 50 MG tablet Take 1 tablet (50 mg total) by mouth daily. TAKE 1 TABLET(50MG TOTAL) BY MOUTH EVERY DAY 12/02/20 12/02/21 Yes Vevelyn Francois, NP  metoprolol succinate (TOPROL-XL) 200 MG 24 hr tablet Take 1 tablet (200 mg total) by mouth daily. Take with or immediately following a meal. Patient taking differently: Take 200 mg by mouth daily. 10/14/20  Yes Patwardhan, Manish J, MD  nicotine (NICODERM CQ) 21 mg/24hr patch Place 1 patch (21 mg total) onto the skin daily. 07/12/20  Yes Lauraine Rinne, NP  norethindrone (AYGESTIN) 5 MG tablet Take 2 tablets (10 mg total) by mouth in the morning, at noon, in the evening, and at bedtime. With bleeding 10/19/20  Yes Aletha Halim, MD  pantoprazole (PROTONIX) 40 MG tablet Take 1 tablet (40 mg total) by mouth daily. 12/02/20 12/02/21 Yes King, Diona Foley, NP  potassium chloride SA (KLOR-CON) 20 MEQ tablet Take 20 mEq by mouth daily. 11/16/20  Yes [provider]  rOPINIRole (REQUIP) 0.25 MG tablet Take 1 tablet (0.25 mg total) by mouth 3 (three) times daily. 11/19/20 11/19/21 Yes Raulkar, Clide Deutscher, MD  rosuvastatin (CRESTOR) 5 MG tablet Take 1 tablet (5 mg total) by mouth daily. 08/11/20  Yes Lorretta Harp, MD  saxagliptin HCl (ONGLYZA) 5 MG TABS tablet Take 1 tablet (5 mg total) by mouth daily. 12/02/20 12/02/21 Yes Vevelyn Francois, NP  spironolactone (ALDACTONE) 50 MG tablet TAKE 1 TABLET(50 MG) BY MOUTH DAILY Patient taking differently: Take 50 mg by mouth daily. 12/22/20  Yes Patwardhan, Manish J, MD  Tiotropium Bromide Monohydrate (SPIRIVA RESPIMAT) 2.5 MCG/ACT AERS Inhale 2 puffs into the lungs daily. 09/27/20  Yes Icard, Bradley L, DO  tiZANidine (ZANAFLEX) 4 MG capsule  Take 1 capsule (4 mg total) by mouth 3 (three) times daily. Do not drive while taking this medication. 12/02/20 12/02/21 Yes Vevelyn Francois, NP  Vitamin D, Ergocalciferol, (DRISDOL) 1.25  MG (50000 UNIT) CAPS capsule Take 1 capsule (50,000 Units total) by mouth every 7 (seven) days. Patient taking differently: Take 50,000 Units by mouth every Friday. 12/02/19  Yes Vevelyn Francois, NP  ACCU-CHEK GUIDE test strip USE AS DIRECTED UP TO FOUR TIMES DAILY 11/11/20   Vevelyn Francois, NP  albuterol (PROVENTIL) (2.5 MG/3ML) 0.083% nebulizer solution Take 3 mLs (2.5 mg total) by nebulization every 6 (six) hours as needed for wheezing or shortness of breath. 09/27/20   Icard, Octavio Graves, DO  albuterol (VENTOLIN HFA) 108 (90 Base) MCG/ACT inhaler Inhale 2 puffs into the lungs every 6 (six) hours as needed for wheezing or shortness of breath. 09/27/20   June Leap L, DO  blood glucose meter kit and supplies KIT 1 each by Other route See admin instructions. Dispense based on patient and insurance preference. Use up to four times daily as directed. (FOR ICD-9 250.00, 250.01). 09/02/20   Vevelyn Francois, NP  diclofenac Sodium (VOLTAREN) 1 % GEL Apply 4 g topically 4 (four) times daily as needed (pain). 08/15/20   [provider]  hydrocortisone 2.5 % cream Apply topically 2 (two) times daily. Patient taking differently: Apply 1 application topically 2 (two) times daily. 09/22/20   Vevelyn Francois, NP  hydroquinone 4 % cream APPLY TOPICALLY TWICE DAILY Patient taking differently: Apply 1 application topically 2 (two) times daily as needed (pain). 09/13/20   Vevelyn Francois, NP  Insulin Pen Needle (PEN NEEDLES) 30G X 5 MM MISC 1 Units by Does not apply route as directed. 12/02/19   Vevelyn Francois, NP  methocarbamol (ROBAXIN) 500 MG tablet Take 500-1,000 mg by mouth every 6 (six) hours as needed for muscle spasms. 12/21/20   [provider]  ondansetron (ZOFRAN ODT) 4 MG disintegrating tablet Take 1 tablet  (4 mg total) by mouth every 8 (eight) hours as needed for nausea or vomiting. 12/23/20   Lucrezia Starch, MD  Turmeric 500 MG CAPS Take 500 mg by mouth 2 (two) times daily. Patient not taking: No sig reported 08/22/19   Hilts, Michael, MD  vitamin E (VITAMIN E) 180 MG (400 UNITS) capsule Take 1 capsule (400 Units total) by mouth daily. 01/07/21   Izora Ribas, MD    Allergies    Ketoprofen, Aspirin, Gabapentin, Ibuprofen, Liraglutide, Naproxen, Omeprazole-sodium bicarbonate, Sulfa antibiotics, and Tramadol  Review of Systems   Review of Systems  Unable to perform ROS: Mental status change  Gastrointestinal: Positive for nausea and vomiting.  Skin: Negative for rash.  Psychiatric/Behavioral: Positive for confusion.    Physical Exam Updated Vital Signs BP (!) 189/110 (BP Location: Left Arm)   Pulse (!) 116   Temp 98.9 F (37.2 C) (Oral)   Resp 18   Ht _0  (1.626 m)   Wt 124.1 kg   SpO2 100%   BMI 46.96 kg/m   Physical Exam Vitals and nursing note reviewed.  Constitutional:      General: She is not in acute distress.    Appearance: She is well-developed. She is not diaphoretic.  HENT:     Head: Normocephalic and atraumatic.  Eyes:     Conjunctiva/sclera: Conjunctivae normal.  Cardiovascular:     Rate and Rhythm: Normal rate and regular rhythm.     Heart sounds: Normal heart sounds. No murmur heard. No friction rub. No gallop.   Pulmonary:     Effort: Pulmonary effort is normal. No respiratory distress.     Breath sounds: Normal breath  sounds. No wheezing or rales.  Abdominal:     General: There is no distension.     Palpations: Abdomen is soft.     Tenderness: There is no abdominal tenderness. There is no guarding.  Musculoskeletal:        General: No tenderness.     Cervical back: Normal range of motion.  Skin:    General: Skin is warm and dry.     Findings: No erythema or rash.  Neurological:     Comments: Sleepy, difficult to understand, knows she is  at hospital, unable to state date, will answer some questions and not others, saying things that don't make sense, she is sleepy, picking at things on her blankets Normal strength and sensation on exam, normal EOM, symmetric facies, midline tongue      ED Results / Procedures / Treatments   Labs (all labs ordered are listed, but only abnormal results are displayed) Labs Reviewed  CBC WITH DIFFERENTIAL/PLATELET - Abnormal; Notable for the following components:      Result Value   Hemoglobin 9.7 (*)    HCT 32.4 (*)    MCV 73.0 (*)    MCH 21.8 (*)    MCHC 29.9 (*)    RDW 24.0 (*)    All other components within normal limits  COMPREHENSIVE METABOLIC PANEL - Abnormal; Notable for the following components:   Glucose, Bld 404 (*)    Creatinine, Ser 1.61 (*)    Albumin 3.4 (*)    GFR, Estimated 40 (*)    All other components within normal limits  URINALYSIS, ROUTINE W REFLEX MICROSCOPIC - Abnormal; Notable for the following components:   Color, Urine RED (*)    APPearance TURBID (*)    Glucose, UA   (*)    Value: TEST NOT REPORTED DUE TO COLOR INTERFERENCE OF URINE PIGMENT   Hgb urine dipstick   (*)    Value: TEST NOT REPORTED DUE TO COLOR INTERFERENCE OF URINE PIGMENT   Bilirubin Urine   (*)    Value: TEST NOT REPORTED DUE TO COLOR INTERFERENCE OF URINE PIGMENT   Ketones, ur   (*)    Value: TEST NOT REPORTED DUE TO COLOR INTERFERENCE OF URINE PIGMENT   Protein, ur   (*)    Value: TEST NOT REPORTED DUE TO COLOR INTERFERENCE OF URINE PIGMENT   Nitrite   (*)    Value: TEST NOT REPORTED DUE TO COLOR INTERFERENCE OF URINE PIGMENT   Leukocytes,Ua   (*)    Value: TEST NOT REPORTED DUE TO COLOR INTERFERENCE OF URINE PIGMENT   All other components within normal limits  LACTIC ACID, PLASMA - Abnormal; Notable for the following components:   Lactic Acid, Venous 2.6 (*)    All other components within normal limits  LACTIC ACID, PLASMA - Abnormal; Notable for the following components:    Lactic Acid, Venous 2.2 (*)    All other components within normal limits  BETA-HYDROXYBUTYRIC ACID - Abnormal; Notable for the following components:   Beta-Hydroxybutyric Acid 0.92 (*)    All other components within normal limits  SALICYLATE LEVEL - Abnormal; Notable for the following components:   Salicylate Lvl <9.6 (*)    All other components within normal limits  ACETAMINOPHEN LEVEL - Abnormal; Notable for the following components:   Acetaminophen (Tylenol), Serum <10 (*)    All other components within normal limits  GLUCOSE, CAPILLARY - Abnormal; Notable for the following components:   Glucose-Capillary 356 (*)    All  other components within normal limits  I-STAT VENOUS BLOOD GAS, ED - Abnormal; Notable for the following components:   pH, Ven 7.513 (*)    pCO2, Ven 32.3 (*)    pO2, Ven 99.0 (*)    Acid-Base Excess 3.0 (*)    Calcium, Ion 1.10 (*)    HCT 34.0 (*)    Hemoglobin 11.6 (*)    All other components within normal limits  RESP PANEL BY RT-PCR (FLU A&B, COVID) ARPGX2  LIPASE, BLOOD  ETHANOL  RAPID URINE DRUG SCREEN, HOSP PERFORMED  URINALYSIS, MICROSCOPIC (REFLEX)  BASIC METABOLIC PANEL  CBC  I-STAT CHEM 8, ED  I-STAT BETA HCG BLOOD, ED ([facility], [facility], [facility] ONLY)  TROPONIN I (HIGH SENSITIVITY)  TROPONIN I (HIGH SENSITIVITY)    EKG EKG Interpretation  Date/Time:  Saturday January 08 2021 18:16:17 EDT Ventricular Rate:  116 PR Interval:    QRS Duration: 90 QT Interval:  327 QTC Calculation: 455 R Axis:   58 Text Interpretation: Sinus tachycardia Borderline T abnormalities, inferior leads Since prior ECG, more exagerrated ST changes inferior/lateral leads however similar Confirmed by Gareth Morgan [phone]) on 01/08/2021 7:43:59 PM   Radiology CT Head Wo Contrast  Result Date: 01/08/2021 CLINICAL DATA:  Altered mental status, delirium. EXAM: CT HEAD WITHOUT CONTRAST TECHNIQUE: Contiguous axial images were obtained from the base of the skull through the vertex without  intravenous contrast. COMPARISON:  Head CT November 20, 2020 FINDINGS: Brain: No evidence of acute large vascular territory infarction, hemorrhage, hydrocephalus, extra-axial collection or mass lesion/mass effect. Vascular: No hyperdense vessel or unexpected calcification. Skull: Normal. Negative for fracture or focal lesion. Sinuses/Orbits: Paranasal sinuses and ethmoid air cells are predominantly clear. Pneumatization of the left greater than right petrous apices. Other: Streak artifact from dental hardware. IMPRESSION: No acute intracranial abnormality. Electronically Signed   By: Dahlia Bailiff MD   On: 01/08/2021 19:53   DG Chest Portable 1 View  Result Date: 01/08/2021 CLINICAL DATA:  Altered mental status. EXAM: PORTABLE CHEST 1 VIEW COMPARISON:  Chest radiograph December 24, 2020 FINDINGS: Heart is enlarged, similar prior. Pulmonary vascular congestion. Right basilar linear atelectasis. Low lung volumes. No lobar consolidation. Both lungs are clear. The visualized skeletal structures are unremarkable. IMPRESSION: 1. Cardiomegaly with pulmonary vascular congestion. 2. Low lung volumes with right basilar linear atelectasis. No lobar consolidation. Electronically Signed   By: Dahlia Bailiff MD   On: 01/08/2021 19:06    Procedures .Critical Care Performed by: Gareth Morgan, MD Authorized by: Gareth Morgan, MD   Critical care provider statement:    Critical care time (minutes):  30   Critical care was time spent personally by me on the following activities:  Discussions with consultants, evaluation of patient's response to treatment, examination of patient, ordering and performing treatments and interventions, ordering and review of laboratory studies, ordering and review of radiographic studies, pulse oximetry, re-evaluation of patient's condition, obtaining history from patient or surrogate and review of old charts     Medications Ordered in ED Medications  insulin aspart (novoLOG)  injection 0-20 Units (has no administration in time range)  insulin aspart (novoLOG) injection 0-5 Units (5 Units Subcutaneous Given 01/08/21 2318)  enoxaparin (LOVENOX) injection 60 mg (60 mg Subcutaneous Given 01/08/21 2153)  0.9 %  sodium chloride infusion (0 mLs Intravenous Paused 01/08/21 2318)  acetaminophen (TYLENOL) tablet 650 mg (650 mg Oral Given 01/08/21 2318)    Or  acetaminophen (TYLENOL) suppository 650 mg ( Rectal See Alternative 01/08/21 2318)  HYDROcodone-acetaminophen (NORCO/VICODIN) 5-325  MG per tablet 1-2 tablet (has no administration in time range)  morphine 2 MG/ML injection 2 mg (has no administration in time range)  ondansetron (ZOFRAN) tablet 4 mg (has no administration in time range)    Or  ondansetron (ZOFRAN) injection 4 mg (has no administration in time range)  labetalol (NORMODYNE) injection 10 mg (has no administration in time range)  sodium chloride 0.9 % bolus 1,000 mL (1,000 mLs Intravenous New Bag/Given 01/08/21 1857)  sodium chloride 0.9 % bolus 1,000 mL (1,000 mLs Intravenous New Bag/Given 01/08/21 2152)    ED Course  I have reviewed the triage vital signs and the nursing notes.  Pertinent labs & imaging results that were available during my care of the patient were reviewed by me and considered in my medical decision making (see chart for details).    MDM Rules/Calculators/A&P                          45 year old female with history of COPD, diabetes, chronic diastolic congestive heart failure, hypertension, seizures related to stress and blood sugar, sickle cell trait, dysfunctional uterine bleeding, chronic pain, recent admission after trauma to [facility] 11/2020 and admission 3/10 to our facility who presents with concern for altered mental status with family concerned she may have overdosed on amitriptyline and tizanidine.  Hyperglycemia present but no signs of DKA.  CT head without acute abnormalities. Do not detect focal abnormality to suggest CVA and in  setting of suspected overdose feel this is more likely than CVA, she has no signs of LVO and is out of tPA window.  If she does not improve would consider work up with MRI but do feel presentation is most consistent with overdose.  Family present with pill bottles showing she has taken her medications inappropriately, unclear motivation as she did not state she was suicidal, however has a history of MDD and mother reports she is under stress and feel that whether this was irresponsible and unsafe treatment of chronic pain or suicide attempt that she would benefit from psychiatric evaluation.  Placed IVC in case she does attempt to leave given concern for her safety.    Her presentation including tachycardia, confusion noted do seem consistent with anticholinergic toxidrome which may be related to amitriptyline. Did discuss with poison control, symptoms at times may be present 25hr..  Will admit for continued monitoring in setting of ingestion and altered mental status.  Final Clinical Impression(s) / ED Diagnoses Final diagnoses:  Altered mental status, unspecified altered mental status type  Drug overdose, undetermined intent, initial encounter  Tachycardia  Lactic acidosis  Hyperglycemia    Rx / DC Orders ED Discharge Orders    None       Gareth Morgan, MD 01/09/21 0041

## 2021-01-08 NOTE — ED Triage Notes (Signed)
Pt arrived to ED via EMS from home w/ c/o AMS w/ LKW 2000 yesterday (01-08-21). Pt has hx of DM and CBG w/ EMS was 421. EMS reports per family pt was walking funny and not acting right this morning. EMS reports pt thought she was sitting on toilet, but was sitting on the bathtub. EMS reports pt was alert and oriented to self only upon arrival and unable to ambulate. Upon arrival to ED pt was able to transfer from EMS stretcher to ED stretcher w/ minimal assistance. Pt is lethargic and alert to voice and painful stimuli intermittently. Pt unable to answer orientation questions and is reaching for things that aren't there. Notified Schlossman MD.

## 2021-01-08 NOTE — ED Notes (Signed)
Admitting provider at bedside.

## 2021-01-08 NOTE — Progress Notes (Signed)
   01/08/21 2246  Assess: MEWS Score  Temp 98.9 F (37.2 C)  BP (!) 189/110  Pulse Rate (!) 116  Resp 18  SpO2 100 %  O2 Device Room Air  Assess: MEWS Score  MEWS Temp 0  MEWS Systolic 0  MEWS Pulse 2  MEWS RR 0  MEWS LOC 0  MEWS Score 2  MEWS Score Color Yellow  Assess: if the MEWS score is Yellow or Red  Were vital signs taken at a resting state? Yes  Focused Assessment No change from prior assessment  Early Detection of Sepsis Score *See Row Information* Low  MEWS guidelines implemented *See Row Information* Yes  Treat  Pain Scale 0-10  Pain Score 10  Pain Type Chronic pain  Pain Location Leg  Pain Orientation Left;Right  Pain Intervention(s) Medication (See eMAR)  Multiple Pain Sites Yes  2nd Pain Site  Pain Score 10  Pain Type Chronic pain  Pain Location Back  Pain Intervention(s) Medication (See eMAR)  Take Vital Signs  Increase Vital Sign Frequency  Yellow: Q 2hr X 2 then Q 4hr X 2, if remains yellow, continue Q 4hrs  Escalate  MEWS: Escalate Yellow: discuss with charge nurse/RN and consider discussing with provider and RRT  Notify: Charge Nurse/RN  Name of Charge Nurse/RN Notified Fred, RN  Date Charge Nurse/RN Notified 01/08/21  Time Charge Nurse/RN Notified 2307   MD ordered PRn BP med, will give and continue to monitor.

## 2021-01-09 ENCOUNTER — Other Ambulatory Visit: Payer: Self-pay

## 2021-01-09 DIAGNOSIS — I5032 Chronic diastolic (congestive) heart failure: Secondary | ICD-10-CM | POA: Diagnosis not present

## 2021-01-09 DIAGNOSIS — K3184 Gastroparesis: Secondary | ICD-10-CM | POA: Diagnosis not present

## 2021-01-09 DIAGNOSIS — E1140 Type 2 diabetes mellitus with diabetic neuropathy, unspecified: Secondary | ICD-10-CM | POA: Diagnosis not present

## 2021-01-09 DIAGNOSIS — Z6841 Body Mass Index (BMI) 40.0 and over, adult: Secondary | ICD-10-CM | POA: Diagnosis not present

## 2021-01-09 DIAGNOSIS — G928 Other toxic encephalopathy: Secondary | ICD-10-CM | POA: Diagnosis not present

## 2021-01-09 DIAGNOSIS — Z20822 Contact with and (suspected) exposure to covid-19: Secondary | ICD-10-CM | POA: Diagnosis not present

## 2021-01-09 DIAGNOSIS — M4856XA Collapsed vertebra, not elsewhere classified, lumbar region, initial encounter for fracture: Secondary | ICD-10-CM | POA: Diagnosis not present

## 2021-01-09 DIAGNOSIS — E874 Mixed disorder of acid-base balance: Secondary | ICD-10-CM | POA: Diagnosis not present

## 2021-01-09 DIAGNOSIS — T43011A Poisoning by tricyclic antidepressants, accidental (unintentional), initial encounter: Secondary | ICD-10-CM | POA: Diagnosis not present

## 2021-01-09 DIAGNOSIS — N179 Acute kidney failure, unspecified: Secondary | ICD-10-CM | POA: Diagnosis not present

## 2021-01-09 DIAGNOSIS — I110 Hypertensive heart disease with heart failure: Secondary | ICD-10-CM | POA: Diagnosis not present

## 2021-01-09 DIAGNOSIS — E1143 Type 2 diabetes mellitus with diabetic autonomic (poly)neuropathy: Secondary | ICD-10-CM | POA: Diagnosis not present

## 2021-01-09 LAB — CBC
HCT: 30.7 % — ABNORMAL LOW (ref 36.0–46.0)
Hemoglobin: 9.6 g/dL — ABNORMAL LOW (ref 12.0–15.0)
MCH: 23.4 pg — ABNORMAL LOW (ref 26.0–34.0)
MCHC: 31.3 g/dL (ref 30.0–36.0)
MCV: 74.9 fL — ABNORMAL LOW (ref 80.0–100.0)
Platelets: 234 10*3/uL (ref 150–400)
RBC: 4.1 MIL/uL (ref 3.87–5.11)
RDW: 24.5 % — ABNORMAL HIGH (ref 11.5–15.5)
WBC: 8.1 10*3/uL (ref 4.0–10.5)
nRBC: 0 % (ref 0.0–0.2)

## 2021-01-09 LAB — BASIC METABOLIC PANEL
Anion gap: 12 (ref 5–15)
BUN: 13 mg/dL (ref 6–20)
CO2: 21 mmol/L — ABNORMAL LOW (ref 22–32)
Calcium: 8.8 mg/dL — ABNORMAL LOW (ref 8.9–10.3)
Chloride: 105 mmol/L (ref 98–111)
Creatinine, Ser: 1.32 mg/dL — ABNORMAL HIGH (ref 0.44–1.00)
GFR, Estimated: 51 mL/min — ABNORMAL LOW (ref >60)
Glucose, Bld: 311 mg/dL — ABNORMAL HIGH (ref 70–99)
Potassium: 4.2 mmol/L (ref 3.5–5.1)
Sodium: 138 mmol/L (ref 135–145)

## 2021-01-09 LAB — GLUCOSE, CAPILLARY
Glucose-Capillary: 329 mg/dL — ABNORMAL HIGH (ref 70–99)
Glucose-Capillary: 189 mg/dL — ABNORMAL HIGH (ref 70–99)
Glucose-Capillary: 183 mg/dL — ABNORMAL HIGH (ref 70–99)
Glucose-Capillary: 158 mg/dL — ABNORMAL HIGH (ref 70–99)

## 2021-01-09 MED ORDER — METOPROLOL SUCCINATE ER 100 MG PO TB24
200.0000 mg | ORAL_TABLET | Freq: Every day | ORAL | Status: DC
  Administered 2021-01-09 – 2021-01-11 (×3): 200 mg via ORAL
  Filled 2021-01-09 (×3): qty 2

## 2021-01-09 MED ORDER — LIDOCAINE 5 % EX PTCH
1.0000 | MEDICATED_PATCH | CUTANEOUS | Status: DC
  Administered 2021-01-09 – 2021-01-12 (×4): 1 via TRANSDERMAL
  Filled 2021-01-09 (×4): qty 1

## 2021-01-09 MED ORDER — INSULIN ASPART PROT & ASPART (70-30 MIX) 100 UNIT/ML ~~LOC~~ SUSP
50.0000 [IU] | Freq: Two times a day (BID) | SUBCUTANEOUS | Status: DC
  Administered 2021-01-09 – 2021-01-12 (×7): 50 [IU] via SUBCUTANEOUS
  Filled 2021-01-09 (×2): qty 10

## 2021-01-09 MED ORDER — ROPINIROLE HCL 0.25 MG PO TABS
0.2500 mg | ORAL_TABLET | Freq: Three times a day (TID) | ORAL | Status: DC
  Administered 2021-01-09 – 2021-01-12 (×10): 0.25 mg via ORAL
  Filled 2021-01-09 (×13): qty 1

## 2021-01-09 MED ORDER — PANTOPRAZOLE SODIUM 40 MG PO TBEC
40.0000 mg | DELAYED_RELEASE_TABLET | Freq: Every day | ORAL | Status: DC
  Administered 2021-01-09 – 2021-01-12 (×4): 40 mg via ORAL
  Filled 2021-01-09 (×4): qty 1

## 2021-01-09 MED ORDER — FERROUS SULFATE 325 (65 FE) MG PO TABS
325.0000 mg | ORAL_TABLET | Freq: Three times a day (TID) | ORAL | Status: DC
  Administered 2021-01-09 – 2021-01-11 (×7): 325 mg via ORAL
  Filled 2021-01-09 (×7): qty 1

## 2021-01-09 MED ORDER — BUPROPION HCL ER (SR) 150 MG PO TB12
150.0000 mg | ORAL_TABLET | Freq: Two times a day (BID) | ORAL | Status: DC
  Administered 2021-01-09 – 2021-01-12 (×7): 150 mg via ORAL
  Filled 2021-01-09 (×7): qty 1

## 2021-01-09 MED ORDER — HYDRALAZINE HCL 50 MG PO TABS
50.0000 mg | ORAL_TABLET | Freq: Three times a day (TID) | ORAL | Status: DC
  Administered 2021-01-09 – 2021-01-12 (×10): 50 mg via ORAL
  Filled 2021-01-09 (×10): qty 1

## 2021-01-09 MED ORDER — NICOTINE 21 MG/24HR TD PT24
21.0000 mg | MEDICATED_PATCH | Freq: Every day | TRANSDERMAL | Status: DC
  Administered 2021-01-09 – 2021-01-12 (×4): 21 mg via TRANSDERMAL
  Filled 2021-01-09 (×4): qty 1

## 2021-01-09 MED ORDER — TIOTROPIUM BROMIDE MONOHYDRATE 2.5 MCG/ACT IN AERS: 2.0000 | INHALATION_SPRAY | Freq: Every day | RESPIRATORY_TRACT | Status: DC

## 2021-01-09 MED ORDER — UMECLIDINIUM BROMIDE 62.5 MCG/INH IN AEPB
1.0000 | INHALATION_SPRAY | Freq: Every day | RESPIRATORY_TRACT | Status: DC
  Administered 2021-01-10 – 2021-01-12 (×3): 1 via RESPIRATORY_TRACT
  Filled 2021-01-09: qty 7

## 2021-01-09 MED ORDER — AMITRIPTYLINE HCL 75 MG PO TABS
150.0000 mg | ORAL_TABLET | Freq: Every day | ORAL | Status: DC
  Filled 2021-01-09: qty 2

## 2021-01-09 MED ORDER — METHOCARBAMOL 500 MG PO TABS
500.0000 mg | ORAL_TABLET | Freq: Three times a day (TID) | ORAL | Status: DC | PRN
  Administered 2021-01-10 – 2021-01-12 (×2): 500 mg via ORAL
  Filled 2021-01-09 (×2): qty 1

## 2021-01-09 MED ORDER — HYDROCODONE-ACETAMINOPHEN 5-325 MG PO TABS
1.0000 | ORAL_TABLET | Freq: Three times a day (TID) | ORAL | Status: DC | PRN
Start: 2021-01-09 — End: 2021-01-12
  Administered 2021-01-09 – 2021-01-12 (×6): 1 via ORAL
  Filled 2021-01-09 (×6): qty 1

## 2021-01-09 MED ORDER — LOSARTAN POTASSIUM 50 MG PO TABS
50.0000 mg | ORAL_TABLET | Freq: Every day | ORAL | Status: DC
  Administered 2021-01-09 – 2021-01-12 (×4): 50 mg via ORAL
  Filled 2021-01-09 (×4): qty 1

## 2021-01-09 MED ORDER — MOMETASONE FURO-FORMOTEROL FUM 200-5 MCG/ACT IN AERO
2.0000 | INHALATION_SPRAY | Freq: Two times a day (BID) | RESPIRATORY_TRACT | Status: DC
  Administered 2021-01-09 – 2021-01-12 (×6): 2 via RESPIRATORY_TRACT
  Filled 2021-01-09: qty 8.8

## 2021-01-09 MED ORDER — SPIRONOLACTONE 25 MG PO TABS
50.0000 mg | ORAL_TABLET | Freq: Every day | ORAL | Status: DC
  Administered 2021-01-09 – 2021-01-12 (×4): 50 mg via ORAL
  Filled 2021-01-09 (×4): qty 2

## 2021-01-09 MED ORDER — ACETAMINOPHEN 325 MG PO TABS
650.0000 mg | ORAL_TABLET | Freq: Three times a day (TID) | ORAL | Status: DC
  Administered 2021-01-09 – 2021-01-12 (×10): 650 mg via ORAL
  Filled 2021-01-09 (×10): qty 2

## 2021-01-09 MED ORDER — ROSUVASTATIN CALCIUM 5 MG PO TABS
5.0000 mg | ORAL_TABLET | Freq: Every day | ORAL | Status: DC
  Administered 2021-01-09 – 2021-01-12 (×4): 5 mg via ORAL
  Filled 2021-01-09 (×4): qty 1
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
## 2021-01-09 NOTE — Plan of Care (Signed)

## 2021-01-09 NOTE — Progress Notes (Addendum)
Patient arrived to unit, transferred to bed with 4 assist. Alert and oriented x3-disoriented to situation, but answering all questions appropriately. Patients HR and BP elevated and patient complaining of leg, stomach, and back pain. NACL is running for maintenance and 2 boluses are going.  Update: IV infiltrated, IV team consulted. Will restart 1 bolus at a time and continue maintenance fluids and give PRN medication for elevated HR and BP.

## 2021-01-09 NOTE — Plan of Care (Signed)
°  Problem: Education: °Goal: Knowledge of General Education information will improve °Description: Including pain rating scale, medication(s)/side effects and non-pharmacologic comfort measures °Outcome: Progressing °  °Problem: Health Behavior/Discharge Planning: °Goal: Ability to manage health-related needs will improve °Outcome: Progressing °  °Problem: Clinical Measurements: °Goal: Cardiovascular complication will be avoided °Outcome: Progressing °  °

## 2021-01-09 NOTE — Consult Note (Addendum)
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  Thought Processes:Coherent  Descriptions of Associations:Intact  Orientation:Full (Time, Place and Person)  Thought Content:Logical  History of Schizophrenia/Schizoaffective disorder:No data recorded Duration of Psychotic Symptoms:No data recorded Hallucinations:Hallucinations: None  Ideas of Reference:None  Suicidal Thoughts:Suicidal Thoughts: No  Homicidal Thoughts:Homicidal Thoughts: No   Sensorium  Memory:Immediate Fair; Recent Fair; Remote Fair  Judgment:Fair  Insight:Good   Executive Functions  Concentration:Fair  Attention Span:Fair  Recall:Fair  Fund of Knowledge:Good  Language:Good   Psychomotor Activity  Psychomotor Activity:Psychomotor Activity: Decreased   Assets  Assets:Communication Skills; Housing; Resilience; Desire for Improvement; Leisure Time; Social  Support   Sleep  Sleep:Sleep: Fair   Physical Exam: Physical Exam Vitals and nursing note reviewed.  Constitutional:      Appearance: Normal appearance.  HENT:     Head: Normocephalic.     Nose: Nose normal.  Pulmonary:     Effort: Pulmonary effort is normal.  Musculoskeletal:     Cervical back: Normal range of motion.  Neurological:     General: No focal deficit present.     Mental Status: She is alert and oriented to person, place, and time.  Psychiatric:        Attention and Perception: Attention and perception normal.        Mood and Affect: Mood and affect normal.        Speech: Speech normal.        Behavior: Behavior normal. Behavior is cooperative.        Thought Content: Thought content normal.        Cognition and Memory: Cognition and memory normal.        Judgment: Judgment normal.    Review of Systems  Musculoskeletal: Positive for back pain.  All other systems reviewed and are negative.  Blood pressure (!) 171/90, pulse (!) 114, temperature 98.7 F (37.1 C), resp. rate 15, height 5\' 3"  (1.6 m), weight 120.4 kg, SpO2 99 %. Body mass index is 47.02 kg/m.  Treatment Plan Summary: Unintentional amitriptyline and muscle relaxer overdose: -Discontinued amitriptyline 150 mg at bedtime  Disposition: No evidence of imminent risk to self or others at present.   Patient does not meet criteria for psychiatric inpatient admission. Discussed crisis plan, support from social network, calling 911, coming to the Emergency Department, and calling Suicide Hotline.  , NP 01/09/2021 1:05 PM

## 2021-01-10 ENCOUNTER — Other Ambulatory Visit: Payer: Medicaid Other

## 2021-01-10 ENCOUNTER — Inpatient Hospital Stay (HOSPITAL_COMMUNITY): Payer: Medicaid Other

## 2021-01-10 DIAGNOSIS — R109 Unspecified abdominal pain: Secondary | ICD-10-CM | POA: Diagnosis not present

## 2021-01-10 DIAGNOSIS — T43011A Poisoning by tricyclic antidepressants, accidental (unintentional), initial encounter: Secondary | ICD-10-CM | POA: Diagnosis not present

## 2021-01-10 LAB — FERRITIN: Ferritin: 12 ng/mL (ref 11–307)

## 2021-01-10 LAB — COMPREHENSIVE METABOLIC PANEL
ALT: 10 U/L (ref 0–44)
AST: 22 U/L (ref 15–41)
Albumin: 3.4 g/dL — ABNORMAL LOW (ref 3.5–5.0)
Alkaline Phosphatase: 73 U/L (ref 38–126)
Anion gap: 7 (ref 5–15)
BUN: 8 mg/dL (ref 6–20)
CO2: 26 mmol/L (ref 22–32)
Calcium: 9.3 mg/dL (ref 8.9–10.3)
Chloride: 106 mmol/L (ref 98–111)
Creatinine, Ser: 1.41 mg/dL — ABNORMAL HIGH (ref 0.44–1.00)
GFR, Estimated: 47 mL/min — ABNORMAL LOW (ref >60)
Glucose, Bld: 243 mg/dL — ABNORMAL HIGH (ref 70–99)
Potassium: 3.7 mmol/L (ref 3.5–5.1)
Sodium: 139 mmol/L (ref 135–145)
Total Bilirubin: 0.6 mg/dL (ref 0.3–1.2)
Total Protein: 6.8 g/dL (ref 6.5–8.1)

## 2021-01-10 LAB — CBC WITH DIFFERENTIAL/PLATELET
Abs Immature Granulocytes: 0.05 10*3/uL (ref 0.00–0.07)
Basophils Absolute: 0.1 10*3/uL (ref 0.0–0.1)
Basophils Relative: 1 %
Eosinophils Absolute: 0.3 10*3/uL (ref 0.0–0.5)
Eosinophils Relative: 3 %
HCT: 32 % — ABNORMAL LOW (ref 36.0–46.0)
Hemoglobin: 9.5 g/dL — ABNORMAL LOW (ref 12.0–15.0)
Immature Granulocytes: 1 %
Lymphocytes Relative: 20 %
Lymphs Abs: 2 10*3/uL (ref 0.7–4.0)
MCH: 22.7 pg — ABNORMAL LOW (ref 26.0–34.0)
MCHC: 29.7 g/dL — ABNORMAL LOW (ref 30.0–36.0)
MCV: 76.4 fL — ABNORMAL LOW (ref 80.0–100.0)
Monocytes Absolute: 0.7 10*3/uL (ref 0.1–1.0)
Monocytes Relative: 7 %
Neutro Abs: 7 10*3/uL (ref 1.7–7.7)
Neutrophils Relative %: 68 %
Platelets: 266 10*3/uL (ref 150–400)
RBC: 4.19 MIL/uL (ref 3.87–5.11)
RDW: 24.4 % — ABNORMAL HIGH (ref 11.5–15.5)
WBC: 10.1 10*3/uL (ref 4.0–10.5)
nRBC: 0 % (ref 0.0–0.2)

## 2021-01-10 LAB — IRON AND TIBC
Iron: 94 ug/dL (ref 28–170)
Saturation Ratios: 17 % (ref 10.4–31.8)
TIBC: 543 ug/dL — ABNORMAL HIGH (ref 250–450)
UIBC: 449 ug/dL

## 2021-01-10 LAB — AMMONIA: Ammonia: 24 umol/L (ref 9–35)

## 2021-01-10 LAB — GLUCOSE, CAPILLARY
Glucose-Capillary: 184 mg/dL — ABNORMAL HIGH (ref 70–99)
Glucose-Capillary: 147 mg/dL — ABNORMAL HIGH (ref 70–99)
Glucose-Capillary: 227 mg/dL — ABNORMAL HIGH (ref 70–99)
Glucose-Capillary: 160 mg/dL — ABNORMAL HIGH (ref 70–99)

## 2021-01-10 LAB — C-REACTIVE PROTEIN: CRP: 0.7 mg/dL (ref <1.0)

## 2021-01-10 LAB — FOLATE: Folate: 13 ng/mL (ref >5.9)

## 2021-01-10 LAB — T4, FREE: Free T4: 1.18 ng/dL — ABNORMAL HIGH (ref 0.61–1.12)

## 2021-01-10 LAB — HIV ANTIBODY (ROUTINE TESTING W REFLEX): HIV Screen 4th Generation wRfx: NONREACTIVE

## 2021-01-10 LAB — TSH: TSH: 0.969 u[IU]/mL (ref 0.350–4.500)

## 2021-01-10 LAB — VITAMIN B12: Vitamin B-12: 150 pg/mL — ABNORMAL LOW (ref 180–914)

## 2021-01-10 NOTE — Progress Notes (Signed)
   01/10/21 1100  Clinical Encounter Type  Visited With Patient and family together  Visit Type Initial  Referral From Nurse  Consult/Referral To Chaplain  Spiritual Encounters  Spiritual Needs Literature  The chaplain responded to consult for AD. The patient was more interested in insurance. The chaplain provided AD education to the patient and her mother who was at bedside. The chaplain left the paperwork with the patient to review and will be available to answer questions as needed. The chaplain will follow up as needed.
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
## 2021-01-11 DIAGNOSIS — I5032 Chronic diastolic (congestive) heart failure: Secondary | ICD-10-CM

## 2021-01-11 DIAGNOSIS — G8929 Other chronic pain: Secondary | ICD-10-CM | POA: Diagnosis not present

## 2021-01-11 DIAGNOSIS — D649 Anemia, unspecified: Secondary | ICD-10-CM | POA: Diagnosis not present

## 2021-01-11 DIAGNOSIS — J449 Chronic obstructive pulmonary disease, unspecified: Secondary | ICD-10-CM | POA: Diagnosis not present

## 2021-01-11 DIAGNOSIS — N179 Acute kidney failure, unspecified: Secondary | ICD-10-CM

## 2021-01-11 DIAGNOSIS — G9341 Metabolic encephalopathy: Secondary | ICD-10-CM | POA: Diagnosis not present

## 2021-01-11 DIAGNOSIS — T43011A Poisoning by tricyclic antidepressants, accidental (unintentional), initial encounter: Secondary | ICD-10-CM | POA: Diagnosis not present

## 2021-01-11 LAB — GLUCOSE, CAPILLARY
Glucose-Capillary: 152 mg/dL — ABNORMAL HIGH (ref 70–99)
Glucose-Capillary: 153 mg/dL — ABNORMAL HIGH (ref 70–99)
Glucose-Capillary: 348 mg/dL — ABNORMAL HIGH (ref 70–99)
Glucose-Capillary: 202 mg/dL — ABNORMAL HIGH (ref 70–99)

## 2021-01-11 LAB — CBC
HCT: 33.2 % — ABNORMAL LOW (ref 36.0–46.0)
Hemoglobin: 10.1 g/dL — ABNORMAL LOW (ref 12.0–15.0)
MCH: 22.9 pg — ABNORMAL LOW (ref 26.0–34.0)
MCHC: 30.4 g/dL (ref 30.0–36.0)
MCV: 75.3 fL — ABNORMAL LOW (ref 80.0–100.0)
Platelets: 258 10*3/uL (ref 150–400)
RBC: 4.41 MIL/uL (ref 3.87–5.11)
RDW: 25.1 % — ABNORMAL HIGH (ref 11.5–15.5)
WBC: 12 10*3/uL — ABNORMAL HIGH (ref 4.0–10.5)
nRBC: 0 % (ref 0.0–0.2)

## 2021-01-11 LAB — BASIC METABOLIC PANEL
Anion gap: 10 (ref 5–15)
BUN: 8 mg/dL (ref 6–20)
CO2: 23 mmol/L (ref 22–32)
Calcium: 9.2 mg/dL (ref 8.9–10.3)
Chloride: 107 mmol/L (ref 98–111)
Creatinine, Ser: 1.2 mg/dL — ABNORMAL HIGH (ref 0.44–1.00)
GFR, Estimated: 57 mL/min — ABNORMAL LOW (ref >60)
Glucose, Bld: 130 mg/dL — ABNORMAL HIGH (ref 70–99)
Potassium: 3.6 mmol/L (ref 3.5–5.1)
Sodium: 140 mmol/L (ref 135–145)

## 2021-01-11 LAB — MAGNESIUM: Magnesium: 1.9 mg/dL (ref 1.7–2.4)

## 2021-01-11 LAB — RPR: RPR Ser Ql: NONREACTIVE

## 2021-01-11 MED ORDER — HALOPERIDOL LACTATE 5 MG/ML IJ SOLN
INTRAMUSCULAR | Status: AC
  Filled 2021-01-11: qty 1

## 2021-01-11 MED ORDER — FERROUS SULFATE 325 (65 FE) MG PO TABS
325.0000 mg | ORAL_TABLET | Freq: Every day | ORAL | Status: DC
  Administered 2021-01-12: 325 mg via ORAL
  Filled 2021-01-11: qty 1

## 2021-01-11 MED ORDER — HALOPERIDOL LACTATE 5 MG/ML IJ SOLN
5.0000 mg | Freq: Once | INTRAMUSCULAR | Status: AC
  Administered 2021-01-11: 5 mg via INTRAVENOUS

## 2021-01-11 MED ORDER — MELATONIN 3 MG PO TABS
3.0000 mg | ORAL_TABLET | Freq: Every evening | ORAL | Status: DC | PRN
  Administered 2021-01-12: 3 mg via ORAL
  Filled 2021-01-11: qty 1
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
## 2021-01-12 ENCOUNTER — Telehealth: Payer: Self-pay | Admitting: Nurse Practitioner

## 2021-01-12 ENCOUNTER — Ambulatory Visit: Payer: Self-pay

## 2021-01-12 DIAGNOSIS — T43011A Poisoning by tricyclic antidepressants, accidental (unintentional), initial encounter: Secondary | ICD-10-CM | POA: Diagnosis not present

## 2021-01-12 DIAGNOSIS — J449 Chronic obstructive pulmonary disease, unspecified: Secondary | ICD-10-CM | POA: Diagnosis not present

## 2021-01-12 DIAGNOSIS — I5032 Chronic diastolic (congestive) heart failure: Secondary | ICD-10-CM | POA: Diagnosis not present

## 2021-01-12 DIAGNOSIS — N179 Acute kidney failure, unspecified: Secondary | ICD-10-CM | POA: Diagnosis not present

## 2021-01-12 DIAGNOSIS — G8929 Other chronic pain: Secondary | ICD-10-CM | POA: Diagnosis not present

## 2021-01-12 DIAGNOSIS — D649 Anemia, unspecified: Secondary | ICD-10-CM | POA: Diagnosis not present

## 2021-01-12 DIAGNOSIS — G9341 Metabolic encephalopathy: Secondary | ICD-10-CM | POA: Diagnosis not present

## 2021-01-12 LAB — CBC WITH DIFFERENTIAL/PLATELET
Abs Immature Granulocytes: 0.13 10*3/uL — ABNORMAL HIGH (ref 0.00–0.07)
Basophils Absolute: 0.1 10*3/uL (ref 0.0–0.1)
Basophils Relative: 1 %
Eosinophils Absolute: 0.2 10*3/uL (ref 0.0–0.5)
Eosinophils Relative: 2 %
HCT: 33.2 % — ABNORMAL LOW (ref 36.0–46.0)
Hemoglobin: 10.2 g/dL — ABNORMAL LOW (ref 12.0–15.0)
Immature Granulocytes: 1 %
Lymphocytes Relative: 17 %
Lymphs Abs: 2.1 10*3/uL (ref 0.7–4.0)
MCH: 23 pg — ABNORMAL LOW (ref 26.0–34.0)
MCHC: 30.7 g/dL (ref 30.0–36.0)
MCV: 74.8 fL — ABNORMAL LOW (ref 80.0–100.0)
Monocytes Absolute: 0.9 10*3/uL (ref 0.1–1.0)
Monocytes Relative: 7 %
Neutro Abs: 9.3 10*3/uL — ABNORMAL HIGH (ref 1.7–7.7)
Neutrophils Relative %: 72 %
Platelets: 288 10*3/uL (ref 150–400)
RBC: 4.44 MIL/uL (ref 3.87–5.11)
RDW: 25.4 % — ABNORMAL HIGH (ref 11.5–15.5)
WBC: 12.8 10*3/uL — ABNORMAL HIGH (ref 4.0–10.5)
nRBC: 0 % (ref 0.0–0.2)

## 2021-01-12 LAB — GLUCOSE, CAPILLARY
Glucose-Capillary: 138 mg/dL — ABNORMAL HIGH (ref 70–99)
Glucose-Capillary: 209 mg/dL — ABNORMAL HIGH (ref 70–99)

## 2021-01-12 LAB — BASIC METABOLIC PANEL
Anion gap: 13 (ref 5–15)
BUN: 11 mg/dL (ref 6–20)
CO2: 18 mmol/L — ABNORMAL LOW (ref 22–32)
Calcium: 9.3 mg/dL (ref 8.9–10.3)
Chloride: 111 mmol/L (ref 98–111)
Creatinine, Ser: 1.15 mg/dL — ABNORMAL HIGH (ref 0.44–1.00)
GFR, Estimated: >60 mL/min (ref >60)
Glucose, Bld: 115 mg/dL — ABNORMAL HIGH (ref 70–99)
Potassium: 4.5 mmol/L (ref 3.5–5.1)
Sodium: 142 mmol/L (ref 135–145)

## 2021-01-12 MED ORDER — MELATONIN 3 MG PO TABS: 3.0000 mg | ORAL_TABLET | Freq: Every evening | ORAL | 0 refills | Status: DC | PRN

## 2021-01-12 MED ORDER — HYDROCODONE-ACETAMINOPHEN 5-325 MG PO TABS
1.0000 | ORAL_TABLET | Freq: Three times a day (TID) | ORAL | 0 refills | Status: DC | PRN
Start: 2021-01-12 — End: 2021-01-31

## 2021-01-12 NOTE — Plan of Care (Signed)

## 2021-01-12 NOTE — TOC Initial Note (Signed)
Transition of Care [facility]) - Initial/Assessment Note    Patient Details  Name: Jill Shaw MRN: [id number] Date of Birth: December 26, 1975  [facility] CM/SW Contact:    Leone Haven, RN Phone Number: 01/12/2021, 10:32 AM  Clinical Narrative:                 Patient lives with her parent, she has a cane and walker at home , her PCP is Thad Ranger, she goes to [facility] but would like to use pharmacy who had blister packs.  This NCM was told that [facility] does blister packs and delivers for free also, will give this information to patient. NCM offered choice to her for [facility] for med management and HHPT, HHOT. She does not have a preference.  NCM made referral to [facility] , awating call back. She has transportation home today.  Expected Discharge Plan: Home w Home Health Services Barriers to Discharge: No Barriers Identified   Patient Goals and CMS Choice Patient states their goals for this hospitalization and ongoing recovery are:: return home CMS Medicare.gov Compare Post Acute Care list provided to:: Patient Choice offered to / list presented to : Patient  Expected Discharge Plan and Services Expected Discharge Plan: Home w Home Health Services In-house Referral: NA Discharge Planning Services: CM Consult Post Acute Care Choice: Home Health Living arrangements for the past 2 months: Single Family Home Expected Discharge Date: 01/12/21                 DME Agency: NA                  Prior Living Arrangements/Services Living arrangements for the past 2 months: Single Family Home Lives with:: Parents Patient language and need for interpreter reviewed:: Yes Do you feel safe going back to the place where you live?: Yes      Need for Family Participation in Patient Care: Yes (Comment) Care giver support system in place?: Yes (comment) Current home services: DME (has cene, walker, brace for right foot) Criminal Activity/Legal Involvement  Pertinent to Current Situation/Hospitalization: No - Comment as needed  Activities of Daily Living Home Assistive Devices/Equipment: CBG Meter,Cane (specify quad or straight),Walker (specify type) ADL Screening (condition at time of admission) Patient's cognitive ability adequate to safely complete daily activities?: No Is the patient deaf or have difficulty hearing?: No Does the patient have difficulty seeing, even when wearing glasses/contacts?: No Does the patient have difficulty concentrating, remembering, or making decisions?: Yes Patient able to express need for assistance with ADLs?: Yes Does the patient have difficulty dressing or bathing?: No Independently performs ADLs?: Yes (appropriate for developmental age) Does the patient have difficulty walking or climbing stairs?: Yes Weakness of Legs: Both Weakness of Arms/Hands: Both  Permission Sought/Granted                  Emotional Assessment Appearance:: Appears stated age Attitude/Demeanor/Rapport: Engaged Affect (typically observed): Appropriate Orientation: : Oriented to Self,Oriented to Place,Oriented to  Time,Oriented to Situation Alcohol / Substance Use: Not Applicable Psych Involvement: No (comment)  Admission diagnosis:  Ingestion of substance, undetermined intent, initial encounter [T65.94XA] Patient Active Problem List   Diagnosis Date Noted  . Amitriptyline overdose, accidental or unintentional, initial encounter 01/09/2021  . Ingestion of substance, undetermined intent, initial encounter 01/08/2021  . Hyperglycemia due to type 2 diabetes mellitus (HCC) 01/08/2021  . SIRS (systemic inflammatory response syndrome) (HCC) 01/08/2021  . Exertional chest pain 12/31/2020  . SOB (shortness  of breath) 12/24/2020  . DKA (diabetic ketoacidosis) (HCC) 10/15/2020  . Hyperkalemia 10/15/2020  . Severe sepsis (HCC) 10/15/2020  . Acute metabolic encephalopathy 10/15/2020  . Sinus tachycardia 10/14/2020  . Precordial  pain 10/14/2020  . Abnormal findings on diagnostic imaging of lung 08/19/2020  . Physical deconditioning 08/19/2020  . History of endometrial ablation 07/27/2020  . History of alcohol use 07/20/2020  . Current moderate episode of major depressive disorder without prior episode (HCC) 07/20/2020  . Sickle cell trait (HCC) 07/19/2020  . Elevated d-dimer 07/13/2020  . Healthcare maintenance 07/12/2020  . Exertional dyspnea 07/12/2020  . Atypical chest pain 07/12/2020  . At risk for obstructive sleep apnea 07/12/2020  . DKA (diabetic ketoacidoses) 07/02/2020  . Acute on chronic diastolic (congestive) heart failure (HCC) 06/22/2020  . Abdominal pain 03/08/2020  . Chronic diastolic CHF (congestive heart failure) (HCC) 03/08/2020  . AKI (acute kidney injury) (HCC) 01/29/2020  . Chronic anemia 12/19/2019  . Elevated sed rate 11/26/2019  . Elevated C-reactive protein (CRP) 11/26/2019  . Hypoglycemia 02/07/2019  . Hypotension 02/07/2019  . Lactic acidosis 02/07/2019  . Right ankle pain 02/07/2019  . Chronic obstructive pulmonary disease (HCC) 02/05/2019  . Seizure (HCC) 07/31/2018  . Vitamin D deficiency 05/31/2017  . Gastroesophageal reflux disease 05/24/2017  . Abnormal uterine bleeding (AUB) 05/17/2017  . Anemia of chronic disease 04/06/2017  . Knee pain, chronic 03/27/2016  . Controlled type 2 diabetes mellitus without complication, with long-term current use of insulin (HCC) 03/27/2016  . Essential hypertension 03/27/2016  . Obesity, Class III, BMI 40-49.9 (morbid obesity) (HCC) 03/27/2016  . Irritable bowel syndrome with constipation 03/27/2016  . Tobacco dependence 03/27/2016  . CKD (chronic kidney disease) stage 2, GFR 60-89 ml/min 03/25/2016  . Arthropathy, lower leg 05/04/2013  . CTS (carpal tunnel syndrome) 05/04/2013  . Peripheral edema 05/04/2013  . Chronic pain 05/13/2012  . Diabetic gastroparesis (HCC) 11/06/2006  . Hot flashes 11/06/2006   PCP:  Barbette Merino,  NP Pharmacy:   [facility] [phone] - [place], [place] - [address] AT [facility] OF [address] [place] [address] Phone: [phone] Fax: [phone]     Social Determinants of Health (SDOH) Interventions    Readmission Risk Interventions Readmission Risk Prevention Plan 01/12/2021 06/24/2020  Transportation Screening Complete Complete  HRI or Home Care Consult - Complete  Social Work Consult for Recovery Care Planning/Counseling - Complete  Palliative Care Screening - Not Applicable  Medication Review [employer]) Complete Complete  PCP or Specialist appointment within 3-5 days of discharge Complete -  HRI or Home Care Consult Complete -  SW Recovery Care/Counseling Consult Complete -  Palliative Care Screening Not Applicable -  Skilled Nursing Facility Not Applicable -  Some recent data might be hidden

## 2021-01-12 NOTE — Progress Notes (Signed)
D/C instructions given and reviewed. Mother at bedside to transport home.

## 2021-01-12 NOTE — Consult Note (Signed)
   [facility] Inpatient Consult   01/12/2021  Jill Shaw Jul 25, 1976 [id number]   Managed Medicaid: Jill Shaw   PCP: Jill Merino, NP  Patient was referred for post hospital follow up as discussed in the unit progression meeting regarding issues with medications and post hospital high risk needs for Chronic Care Management.  Plan:  Patient has had an attempted outreach and will re-refer to the Managed Medicaid team for post hospital follow up for chronic care.  For questions,  Jill Shanks, RN BSN CCM [facility]  [phone] business mobile phone Toll free office [phone]  Fax number: [phone] [email or web address]

## 2021-01-12 NOTE — Discharge Summary (Signed)
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 (meaning this test can be used) for the duration of the COVID-19 declaration under Section 564(b)(1) of the Act, 21 U.S.C.section 360bbb-3(b)(1), unless the authorization is terminated  or revoked sooner.       Influenza A by PCR NEGATIVE NEGATIVE Final   Influenza B by PCR NEGATIVE NEGATIVE Final    Comment: (NOTE) The Xpert Xpress SARS-CoV-2/FLU/RSV plus assay is intended as an aid in the diagnosis of influenza from Nasopharyngeal swab specimens and should not be used as a sole basis for treatment. Nasal washings and aspirates are unacceptable for Xpert Xpress SARS-CoV-2/FLU/RSV testing.  Fact Sheet for Patients: EntrepreneurPulse.com.au  Fact Sheet for Healthcare Providers: IncredibleEmployment.be  This test is not yet approved or cleared by the [place] FDA and has been authorized for detection and/or diagnosis of SARS-CoV-2  by FDA under an Emergency Use Authorization (EUA). This EUA will remain in effect (meaning this test can be used) for the duration of the COVID-19 declaration under Section 564(b)(1) of the Act, 21 U.S.C. section 360bbb-3(b)(1), unless the authorization is terminated or revoked.  Performed at [facility], [place] [address], [place], [place] [address]      Labs: BNP (last 3 results) Recent Labs    06/22/20 0636 09/20/20 1220 11/20/20 1034  BNP 266.3* 95.8 73.7   Basic Metabolic Panel: Recent Labs  Lab 01/08/21 1850 01/08/21 1859 01/09/21 0349 01/10/21 1558 01/11/21 0446 01/12/21 0618  NA 135 137 138 139 140 142  K 3.8 3.9 4.2 3.7 3.6 4.5  CL 99  --  105 106 107 111  CO2 24  --  21* 26 23 18*  GLUCOSE 404*  --  311* 243* 130* 115*  BUN 15  --  _0 CREATININE 1.61*  --  1.32* 1.41* 1.20* 1.15*  CALCIUM 9.2  --  8.8* 9.3 9.2 9.3  MG  --   --   --   --  1.9  --    Liver Function Tests: Recent Labs  Lab 01/08/21 1850 01/10/21 1558  AST 26 22  ALT 12 10  ALKPHOS 77 73  BILITOT 0.8 0.6  PROT 6.9 6.8  ALBUMIN 3.4* 3.4*   Recent Labs  Lab 01/08/21 1850  LIPASE 22   Recent Labs  Lab 01/10/21 1558  AMMONIA 24   CBC: Recent Labs  Lab 01/08/21 1850 01/08/21 1859 01/09/21 0349 01/10/21 1558 01/11/21 0446 01/12/21 0618  WBC 10.2  --  8.1 10.1 12.0* 12.8*  NEUTROABS 7.0  --   --  7.0  --  9.3*  HGB 9.7* 11.6* 9.6* 9.5* 10.1* 10.2*  HCT 32.4* 34.0* 30.7* 32.0* 33.2* 33.2*  MCV 73.0*  --  74.9* 76.4* 75.3* 74.8*  PLT 263  --  234 266 258 288   Cardiac Enzymes: No results for input(s): CKTOTAL, CKMB, CKMBINDEX, TROPONINI in the last 168 hours. BNP: Invalid input(s): POCBNP CBG: Recent Labs  Lab 01/11/21 0626 01/11/21 1240 01/11/21 1613 01/11/21 2114 01/12/21 0636  GLUCAP 152* 153* 348* 202* 138*   D-Dimer No results for input(s): DDIMER in the last 72 hours. Hgb A1c No results for input(s): HGBA1C in the last 72 hours. Lipid  Profile No results for input(s): CHOL, HDL, LDLCALC, TRIG, CHOLHDL, LDLDIRECT in the last 72 hours. Thyroid function studies Recent Labs    01/10/21 1558  TSH 0.969   Anemia work up Recent Labs    01/10/21 1558  VITAMINB12 150*  FOLATE 13.0  FERRITIN 12  TIBC 543*  IRON 94   Urinalysis    Component Value Date/Time   COLORURINE RED (A) 01/08/2021 2202   APPEARANCEUR TURBID (A) 01/08/2021 2202   LABSPEC  01/08/2021 2202    TEST NOT REPORTED DUE TO COLOR INTERFERENCE OF URINE PIGMENT   PHURINE  01/08/2021 2202    TEST NOT REPORTED DUE TO COLOR INTERFERENCE OF URINE PIGMENT   GLUCOSEU (A) 01/08/2021 2202    TEST NOT REPORTED DUE TO COLOR INTERFERENCE OF URINE PIGMENT   HGBUR (A) 01/08/2021 2202    TEST NOT REPORTED DUE TO COLOR INTERFERENCE OF URINE PIGMENT   BILIRUBINUR (A) 01/08/2021 2202    TEST NOT REPORTED DUE TO COLOR INTERFERENCE OF URINE PIGMENT   BILIRUBINUR negative 06/02/2020 1419   BILIRUBINUR NEGATIVE 11/05/2019 1342   KETONESUR (A) 01/08/2021 2202    TEST NOT REPORTED DUE TO COLOR INTERFERENCE OF URINE PIGMENT   PROTEINUR (A) 01/08/2021 2202    TEST NOT REPORTED DUE TO COLOR INTERFERENCE OF URINE PIGMENT   UROBILINOGEN 0.2 11/12/2020 1148   NITRITE (A) 01/08/2021 2202    TEST NOT REPORTED DUE TO COLOR INTERFERENCE OF URINE PIGMENT   LEUKOCYTESUR (A) 01/08/2021 2202    TEST NOT REPORTED DUE TO COLOR INTERFERENCE OF URINE PIGMENT   Sepsis Labs Invalid input(s): PROCALCITONIN,  WBC,  LACTICIDVEN Microbiology Recent Results (from the past 240 hour(s))  Resp Panel by RT-PCR (Flu A&B, Covid) Nasopharyngeal Swab     Status: None   Collection Time: 01/08/21  7:00 PM   Specimen: Nasopharyngeal Swab; Nasopharyngeal(NP) swabs in vial transport medium  Result Value Ref Range Status   SARS Coronavirus 2 by RT PCR NEGATIVE NEGATIVE Final    Comment: (NOTE) SARS-CoV-2 target nucleic acids are NOT DETECTED.  The SARS-CoV-2 RNA is generally detectable in upper  respiratory specimens during the acute phase of infection. The  lowest concentration of SARS-CoV-2 viral copies this assay can detect is 138 copies/mL. A negative result does not preclude SARS-Cov-2 infection and should not be used as the sole basis for treatment or other patient management decisions. A negative result may occur with  improper specimen collection/handling, submission of specimen other than nasopharyngeal swab, presence of viral mutation(s) within the areas targeted by this assay, and inadequate number of viral copies(<138 copies/mL). A negative result must be combined with clinical observations, patient history, and epidemiological information. The expected result is Negative.  Fact Sheet for Patients:  EntrepreneurPulse.com.au  Fact Sheet for Healthcare Providers:  IncredibleEmployment.be  This test is no t yet approved or cleared by the [place] FDA and  has been authorized for detection and/or diagnosis of SARS-CoV-2 by FDA under an Emergency Use Authorization (EUA). This EUA will remain  in effect (meaning this test can be used) for the duration of the COVID-19 declaration under Section 564(b)(1) of the Act, 21 U.S.C.section 360bbb-3(b)(1), unless the authorization is terminated  or revoked sooner.       Influenza A by PCR NEGATIVE NEGATIVE Final   Influenza B by PCR NEGATIVE NEGATIVE Final    Comment: (NOTE) The Xpert Xpress SARS-CoV-2/FLU/RSV plus assay is intended as an aid in the diagnosis of influenza from Nasopharyngeal swab specimens and should not be used as a sole basis for treatment. Nasal washings and aspirates are unacceptable for Xpert Xpress SARS-CoV-2/FLU/RSV testing.  Fact Sheet for Patients: EntrepreneurPulse.com.au  Fact Sheet for Healthcare Providers: IncredibleEmployment.be  This test is not yet approved or cleared by the [place] FDA and has been  authorized for detection and/or diagnosis of SARS-CoV-2 by FDA under an Emergency Use Authorization (EUA). This EUA will remain in effect (meaning this test can be used) for the duration of the COVID-19 declaration under Section 564(b)(1) of the Act, 21 U.S.C. section 360bbb-3(b)(1), unless the authorization is terminated or revoked.  Performed at [facility], [place] [address] [place], [place] [address]      Time coordinating discharge: 45 minutes  SIGNED:   Tawni Millers, MD  [facility] 01/12/2021, 8:30 AM

## 2021-01-12 NOTE — Plan of Care (Signed)

## 2021-01-12 NOTE — Telephone Encounter (Signed)
Attempted to reach Jill Shaw to get her rescheduled with the [place] RNCM. I left my contact info on her VM.

## 2021-01-12 NOTE — TOC Transition Note (Signed)
Transition of Care [facility]) - CM/SW Discharge Note   Patient Details  Name: Jill Shaw MRN: [id number] Date of Birth: Feb 16, 1976  [facility] of [facility]) CM/SW Contact:  Leone Haven, RN Phone Number: 01/12/2021, 11:59 AM   Clinical Narrative:    Patient is for dc today, [place] with [facility] states she can take referral for HHRN, HHPT, HHOT.  Soc will begin 24 to 48 hrs post dc.  Patient has transportation at discharge. NCM will give her information about [facility] for blister packs.   Final next level of care: Home w Home Health Services Barriers to Discharge: No Barriers Identified   Patient Goals and CMS Choice Patient states their goals for this hospitalization and ongoing recovery are:: return home CMS Medicare.gov Compare Post Acute Care list provided to:: Patient Choice offered to / list presented to : Patient  Discharge Placement                       Discharge Plan and Services In-house Referral: NA Discharge Planning Services: CM Consult Post Acute Care Choice: Home Health            DME Agency: NA       HH Arranged: RN,PT,OT HH Agency:  [facility]) Date HH Agency Contacted: 01/12/21 Time HH Agency Contacted: 1159 Representative spoke with at [facility] Agency: Roxy Manns  Social Determinants of Health (SDOH) Interventions     Readmission Risk Interventions Readmission Risk Prevention Plan 01/12/2021 06/24/2020  Transportation Screening Complete Complete  HRI or Home Care Consult - Complete  Social Work Consult for Recovery Care Planning/Counseling - Complete  Palliative Care Screening - Not Applicable  Medication Review [facility]) Complete Complete  PCP or Specialist appointment within 3-5 days of discharge Complete -  HRI or Home Care Consult Complete -  SW Recovery Care/Counseling Consult Complete -  Palliative Care Screening Not Applicable -  Skilled Nursing Facility Not Applicable -  Some recent data might be hidden

## 2021-01-13 ENCOUNTER — Telehealth: Payer: Self-pay | Admitting: Obstetrics and Gynecology

## 2021-01-13 NOTE — Telephone Encounter (Signed)
Called patient to clarify amount of bleeding and how often she is taking the Norethindrone per Dr. [name].    She had a period about 4-5 weeks ago for 2 weeks, she took her Aygestin during that time as prescribed. She then stopped bleeding for a brief time and then started bleeding again 2 weeks ago . She reports she has been out of Aygestin for this last cycle in the last 2 weeks.  Patient reports she is bleeding heavy for 2 weeks with clots the size of 50 cent piece intermittently, She feels pain prior to passing a clot.  She is currently changing her pad 4-5 times a day.     Reviewed with patient I will send a message to Dr. [name] and to the front office to get her scheduled for next week.

## 2021-01-13 NOTE — Telephone Encounter (Signed)
[place] wants pt to come in tomorrow for appt but pt has another appt, but will come 01-20-21. Pt request a call back today because she can not come in tomorrow and needs to know if this can wait until Thur of next week,

## 2021-01-14 ENCOUNTER — Inpatient Hospital Stay: Payer: Self-pay | Admitting: Nurse Practitioner

## 2021-01-14 ENCOUNTER — Other Ambulatory Visit: Payer: Self-pay | Admitting: Obstetrics and Gynecology

## 2021-01-14 DIAGNOSIS — N939 Abnormal uterine and vaginal bleeding, unspecified: Secondary | ICD-10-CM

## 2021-01-14 MED ORDER — NORETHINDRONE ACETATE 5 MG PO TABS: 10.0000 mg | ORAL_TABLET | Freq: Four times a day (QID) | ORAL | 0 refills | Status: DC

## 2021-01-15 ENCOUNTER — Other Ambulatory Visit: Payer: Self-pay

## 2021-01-18 ENCOUNTER — Other Ambulatory Visit: Payer: Self-pay

## 2021-01-18 ENCOUNTER — Other Ambulatory Visit: Payer: Self-pay | Admitting: Obstetrics and Gynecology

## 2021-01-18 LAB — VITAMIN B1: Vitamin B1 (Thiamine): 116 nmol/L (ref 66.5–200.0)

## 2021-01-18 NOTE — Patient Outreach (Signed)
Medicaid Managed Care   Nurse Care Manager Note  01/18/2021 Name:  Jill Shaw MRN:  [id number] DOB:  01-03-1976  Jill Shaw is an 45 y.o. year old female who is a primary patient of Vevelyn Francois, NP.  The [facility] Managed Care Coordination team was consulted for assistance with:    chronic healthcare management needs.  Jill Shaw was given information about Medicaid Managed Care Coordination team services today. Jill Shaw agreed to services and verbal consent obtained.  Engaged with patient by telephone for initial visit in response to provider referral for case management and/or care coordination services.   Assessments/Interventions:  Review of past medical history, allergies, medications, health status, including review of consultants reports, laboratory and other test data, was performed as part of comprehensive evaluation and provision of chronic care management services.  SDOH (Social Determinants of Health) assessments and interventions performed:   Care Plan  Allergies  Allergen Reactions  . Ketoprofen Nausea And Vomiting  . Aspirin Nausea Only  . Gabapentin Nausea And Vomiting and Other (See Comments)    upset stomach  . Ibuprofen Nausea And Vomiting  . Liraglutide Nausea And Vomiting  . Naproxen Nausea And Vomiting  . Omeprazole-Sodium Bicarbonate Nausea And Vomiting  . Sulfa Antibiotics Nausea And Vomiting  . Tramadol Nausea And Vomiting and Other (See Comments)    stomach upset    Medications Reviewed Today    Reviewed by Gayla Medicus, RN (Registered Nurse) on 01/18/21 at 58  Med List Status: <None>  Medication Order Taking? Sig Documenting Provider Last Dose Status Informant  ACCU-CHEK GUIDE test strip [id number]  USE AS DIRECTED UP TO FOUR TIMES DAILY Vevelyn Francois, NP  Active Self  albuterol (PROVENTIL) (2.5 MG/3ML) 0.083% nebulizer solution [id number]  Take 3 mLs (2.5 mg total) by nebulization every 6 (six) hours as needed for wheezing or  shortness of breath. Garner Nash, DO  Active Self  albuterol (VENTOLIN HFA) 108 (90 Base) MCG/ACT inhaler [id number]  Inhale 2 puffs into the lungs every 6 (six) hours as needed for wheezing or shortness of breath. Garner Nash, DO  Active Self  blood glucose meter kit and supplies KIT [id number]  1 each by Other route See admin instructions. Dispense based on patient and insurance preference. Use up to four times daily as directed. (FOR ICD-9 250.00, 250.01). Vevelyn Francois, NP  Active Self  budesonide-formoterol [facility]) 160-4.5 MCG/ACT inhaler [id number]  INHALE 2 PUFFS INTO THE LUNGS 2 (TWO) TIMES DAILY.  Patient taking differently: Inhale 2 puffs into the lungs 2 (two) times daily.   Garner Nash, DO  Active Self  buPROPion (WELLBUTRIN SR) 150 MG 12 hr tablet [id number] No Take 1 tablet (150 mg total) by mouth 2 (two) times daily.  Patient not taking: Reported on 01/18/2021   [name], MD Not Taking Active Self  diclofenac Sodium (VOLTAREN) 1 % GEL [id number]  Apply 4 g topically 4 (four) times daily as needed (pain). [provider]  Active Self  ferrous sulfate 325 (65 FE) MG tablet [id number]  Take 1 tablet (325 mg total) by mouth 3 (three) times daily with meals. Vevelyn Francois, NP  Active Self  fluticasone (FLONASE) 50 MCG/ACT nasal spray [id number]  Place 2 sprays into both nostrils daily. Vevelyn Francois, NP  Active Self  furosemide (LASIX) 40 MG tablet [id number]  Take 1 tablet (40 mg total) by mouth 2 (two) times daily. Lorretta Harp, MD  Active Self  Glucosamine Sulfate 1000 MG CAPS [id number]  Take 1 capsule (1,000 mg total) by mouth 2 (two) times daily.  Patient taking differently: Take 1,000 mg by mouth 2 (two) times daily.   Hilts, Michael, MD  Active Self  hydrALAZINE (APRESOLINE) 50 MG tablet [id number]  Take 50 mg by mouth 3 (three) times daily. [provider]  Active Self           Med Note Jimmey Ralph, [facility] I   Sat Jan 08, 2021  9:34 PM)     HYDROcodone-acetaminophen (NORCO/VICODIN) 5-325 MG tablet [id number]  Take 1 tablet by mouth 3 (three) times daily as needed for moderate pain or severe pain. Arrien, Jimmy Picket, MD  Active   hydrocortisone 2.5 % cream [id number]  Apply topically 2 (two) times daily.  Patient taking differently: Apply 1 application topically 2 (two) times daily.   Vevelyn Francois, NP  Active Self  hydrocortisone 2.5 % cream [id number]  APPLY TOPICALLY 2 (TWO) TIMES DAILY.  Patient not taking: Reported on 07/16/2020   Vevelyn Francois, NP  Active   hydroquinone 4 % cream [id number]  APPLY TOPICALLY TWICE DAILY  Patient taking differently: Apply 1 application topically 2 (two) times daily as needed (pain).   Vevelyn Francois, NP  Active Self  Insulin Lispro Prot & Lispro (HUMALOG MIX 75/25 KWIKPEN) (75-25) 100 UNIT/ML Kwikpen [id number]  110 units every 12 hours Vevelyn Francois, NP  Active Self  Insulin Pen Needle (PEN NEEDLES) 30G X 5 MM MISC [id number]  1 Units by Does not apply route as directed. Vevelyn Francois, NP  Active Self  lidocaine (LIDODERM) 5 % [id number]  Place 1 patch onto the skin daily. Remove & Discard patch within 12 hours or as directed by MD Raulkar, Clide Deutscher, MD  Active Self           Med Note Jimmey Ralph, [facility] I   Sat Jan 08, 2021  9:30 PM) Patch has been removed  losartan (COZAAR) 50 MG tablet [id number]  Take 1 tablet (50 mg total) by mouth daily. TAKE 1 TABLET(50MG TOTAL) BY MOUTH EVERY DAY Vevelyn Francois, NP  Active Self  melatonin 3 MG TABS tablet [id number]  Take 1 tablet (3 mg total) by mouth at bedtime as needed. Arrien, Jimmy Picket, MD  Active   methocarbamol (ROBAXIN) 500 MG tablet [id number]  Take 500-1,000 mg by mouth every 6 (six) hours as needed for muscle spasms. [provider]  Active Self  metoprolol succinate (TOPROL-XL) 200 MG 24 hr tablet [id number]  Take 1 tablet (200 mg total) by mouth daily. Take with or immediately following a meal.  Patient taking  differently: Take 200 mg by mouth daily.   Patwardhan, Reynold Bowen, MD  Active Self  nicotine (NICODERM CQ) 21 mg/24hr patch [id number]  Place 1 patch (21 mg total) onto the skin daily. Lauraine Rinne, NP  Active Self           Med Note Isaac Laud I   RSW Jan 08, 2021  9:31 PM) Patch has been removed  norethindrone (AYGESTIN) 5 MG tablet [id number]  Take 2 tablets (10 mg total) by mouth in the morning, at noon, in the evening, and at bedtime. With bleeding Aletha Halim, MD  Active   ondansetron (ZOFRAN ODT) 4 MG disintegrating tablet [id number]  Take 1 tablet (4 mg total) by mouth every 8 (eight) hours as needed for nausea or vomiting. Lucrezia Starch, MD  Active Self  pantoprazole (PROTONIX) 40  MG tablet [id number]  Take 1 tablet (40 mg total) by mouth daily. Vevelyn Francois, NP  Active Self  potassium chloride SA (KLOR-CON) 20 MEQ tablet [id number]  Take 20 mEq by mouth daily. [provider]  Active Self           Med Note Jimmey Ralph, [facility] I   Sat Jan 08, 2021  9:32 PM) Need refill  rOPINIRole (REQUIP) 0.25 MG tablet [id number]  Take 1 tablet (0.25 mg total) by mouth 3 (three) times daily. Izora Ribas, MD  Active Self  rosuvastatin (CRESTOR) 5 MG tablet [id number]  Take 1 tablet (5 mg total) by mouth daily. Lorretta Harp, MD  Active Self  saxagliptin HCl (ONGLYZA) 5 MG TABS tablet [id number]  Take 1 tablet (5 mg total) by mouth daily. Vevelyn Francois, NP  Active Self  spironolactone (ALDACTONE) 50 MG tablet [id number]  TAKE 1 TABLET(50 MG) BY MOUTH DAILY  Patient taking differently: Take 50 mg by mouth daily.   Patwardhan, Reynold Bowen, MD  Active Self  Tiotropium Bromide Monohydrate (SPIRIVA RESPIMAT) 2.5 MCG/ACT AERS [id number]  Inhale 2 puffs into the lungs daily. Garner Nash, DO  Active Self           Med Note Jimmey Ralph, [facility] I   Sat Jan 08, 2021  9:32 PM)    Vitamin D, Ergocalciferol, (DRISDOL) 1.25 MG (50000 UNIT) CAPS capsule [id number]  Take 1 capsule  (50,000 Units total) by mouth every 7 (seven) days.  Patient taking differently: Take 50,000 Units by mouth every Friday.   Vevelyn Francois, NP  Active Self  vitamin E (VITAMIN E) 180 MG (400 UNITS) capsule [id number]  Take 1 capsule (400 Units total) by mouth daily. Izora Ribas, MD  Active Self           Med Note Isaac Laud I   Sat Jan 08, 2021  9:33 PM) Hasn't started.          Patient Active Problem List   Diagnosis Date Noted  . Amitriptyline overdose, accidental or unintentional, initial encounter 01/09/2021  . Ingestion of substance, undetermined intent, initial encounter 01/08/2021  . Hyperglycemia due to type 2 diabetes mellitus ([place]) 01/08/2021  . SIRS (systemic inflammatory response syndrome) ([place]) 01/08/2021  . Exertional chest pain 12/31/2020  . SOB (shortness of breath) 12/24/2020  . DKA (diabetic ketoacidosis) ([place]) 10/15/2020  . Hyperkalemia 10/15/2020  . Severe sepsis ([place]) 10/15/2020  . Acute metabolic encephalopathy 27/03/2375  . Sinus tachycardia 10/14/2020  . Precordial pain 10/14/2020  . Abnormal findings on diagnostic imaging of lung 08/19/2020  . Physical deconditioning 08/19/2020  . History of endometrial ablation 07/27/2020  . History of alcohol use 07/20/2020  . Current moderate episode of major depressive disorder without prior episode ([place]) 07/20/2020  . Sickle cell trait ([place]) 07/19/2020  . Elevated d-dimer 07/13/2020  . Healthcare maintenance 07/12/2020  . Exertional dyspnea 07/12/2020  . Atypical chest pain 07/12/2020  . At risk for obstructive sleep apnea 07/12/2020  . DKA (diabetic ketoacidoses) 07/02/2020  . Acute on chronic diastolic (congestive) heart failure ([place]) 06/22/2020  . Abdominal pain 03/08/2020  . Chronic diastolic CHF (congestive heart failure) ([place]) 03/08/2020  . AKI (acute kidney injury) ([place]) 01/29/2020  . Chronic anemia 12/19/2019  . Elevated sed rate 11/26/2019  . Elevated C-reactive protein (CRP)  11/26/2019  . Hypoglycemia 02/07/2019  . Hypotension 02/07/2019  . Lactic acidosis 02/07/2019  . Right ankle pain 02/07/2019  . Chronic obstructive pulmonary disease ([place]) 02/05/2019  .  Seizure ([place]) 07/31/2018  . Vitamin D deficiency 05/31/2017  . Gastroesophageal reflux disease 05/24/2017  . Abnormal uterine bleeding (AUB) 05/17/2017  . Anemia of chronic disease 04/06/2017  . Knee pain, chronic 03/27/2016  . Controlled type 2 diabetes mellitus without complication, with long-term current use of insulin ([place]) 03/27/2016  . Essential hypertension 03/27/2016  . Obesity, Class III, BMI 40-49.9 (morbid obesity) ([place]) 03/27/2016  . Irritable bowel syndrome with constipation 03/27/2016  . Tobacco dependence 03/27/2016  . CKD (chronic kidney disease) stage 2, GFR 60-89 ml/min 03/25/2016  . Arthropathy, lower leg 05/04/2013  . CTS (carpal tunnel syndrome) 05/04/2013  . Peripheral edema 05/04/2013  . Chronic pain 05/13/2012  . Diabetic gastroparesis ([place]) 11/06/2006  . Hot flashes 11/06/2006    Conditions to be addressed/monitored per PCP order:  chronic healthcare management needs, anemia, arthritis, COPD, DM, CHF, chronic pain, GERD, HTN  Care Plan : General Plan of Care (Adult)  Updates made by Gayla Medicus, RN since 01/18/2021 12:00 AM    Problem: Health Promotion or Disease Self-Management (General Plan of Care)   Priority: High  Onset Date: 01/18/2021  Note:   Current Barriers:   Ineffective Self Health Maintenance  Patient discharged from hospital 01/12/21, HHRN,HHPT,HHOT  ordered for patient.  Patient has not returned phone call to agency to schedule day and time for start of services.  Currently UNABLE TO independently self manage needs related to chronic health conditions.   Knowledge Deficits related to short term plan for care coordination needs and long term plans for chronic disease management needs Nurse Case Manager Clinical Goal(s):   patient will work with care  management team to address care coordination and chronic disease management needs related to Disease Management  Educational Needs  Care Coordination  Medication Management and Education  Medication Reconciliation  Medication Assistance   Psychosocial Support   Interventions:   Evaluation of current treatment plan and patient's adherence to plan as established by provider.  Advised patient to contact [place] to arrange day and time for start of Home Health services.  Reviewed medications with patient.  Collaborated with pharmacy and social work.  Discussed plans with patient for ongoing care management follow up and provided patient with direct contact information for care management team  Reviewed scheduled/upcoming provider appointments.  Social Work referral for depression.  Pharmacy referral for medication review. Self Care Activities:  . Patient will self administer medications as prescribed . Patient will attend all scheduled provider appointments . Patient will call pharmacy for medication refills . Patient will continue to perform ADL's independently . Patient will call provider office for new concerns or questions Patient Goals: Within the next 7 days, patient will contact [place] to follow up. - Follow Up Plan: The patient has been provided with contact information for the care management team and has been advised to call with any health related questions or concerns.  The care management team will reach out to the patient again over the next 7 days.      Follow Up:  Patient agrees to Care Plan and Follow-up.  Plan: The Managed Medicaid care management team will reach out to the patient again over the next 7 days. and The patient has been provided with contact information for the Managed Medicaid care management team and has been advised to call with any health related questions or concerns.  Date/time of next scheduled RN care  management/care coordination outreach:  01/26/21 at 230.

## 2021-01-18 NOTE — Patient Instructions (Signed)
Hi Jill Shaw, thank you for speaking with me today.  Jill Shaw was given information about Medicaid Managed Care team care coordination services as a part of their [facility] Community Plan Medicaid benefit. Jill Shaw verbally consented to engagement with the [facility] Managed Care team.   For questions related to your [facility], please call: [phone] or visit the homepage here: [email or web address]  If you would like to schedule transportation through your [facility], please call the following number at least 2 days in advance of your appointment: [phone].   Call the Behavioral Health Crisis Line at [phone], at any time, 24 hours a day, 7 days a week. If you are in danger or need immediate medical attention call 911.  Jill Shaw - following are the goals we discussed in your visit today:  Goals Addressed            This Visit's Progress   . Protect My Health       Timeframe:  Long-Range Goal Priority:  High Start Date:       01/18/21                      Expected End Date:    04/19/21                   Follow Up Date 01/25/21   - schedule appointment for flu shot - schedule appointment for vaccines needed due to my age or health - schedule recommended health tests (blood work, mammogram, colonoscopy, pap test) - schedule and keep appointment for annual check-up    Why is this important?    Screening tests can find diseases early when they are easier to treat.   Your doctor or nurse will talk with you about which tests are important for you.   Getting shots for common diseases like the flu and shingles will help prevent them.        Patient verbalizes understanding of instructions provided today.   The Managed Medicaid care management team will reach out to the patient again over the next 7 days.  The patient has been provided with contact  information for the Managed Medicaid care management team and has been advised to call with any health related questions or concerns.   Kathi Der RN, BSN [name]  Triad Engineer, production - Managed Medicaid High Risk [phone].  Following is a copy of your plan of care:  Patient Care Plan: General Plan of Care (Adult)    Problem Identified: Health Promotion or Disease Self-Management (General Plan of Care)   Priority: High  Onset Date: 01/18/2021  Note:   Current Barriers:   Ineffective Self Health Maintenance  Patient discharged from hospital 01/12/21, HHRN,HHPT,HHOT  ordered for patient.  Patient has not returned phone call to agency to schedule day and time for start of services.  Currently UNABLE TO independently self manage needs related to chronic health conditions.   Knowledge Deficits related to short term plan for care coordination needs and long term plans for chronic disease management needs Nurse Case Manager Clinical Goal(s):   patient will work with care management team to address care coordination and chronic disease management needs related to Disease Management  Educational Needs  Care Coordination  Medication Management and Education  Medication Reconciliation  Medication Assistance   Psychosocial Support   Interventions:   Evaluation of current treatment plan and patient's adherence to  plan as established by provider.  Advised patient to contact [facility] to arrange day and time for start of Home Health services.  Reviewed medications with patient.  Collaborated with pharmacy and social work.  Discussed plans with patient for ongoing care management follow up and provided patient with direct contact information for care management team  Reviewed scheduled/upcoming provider appointments.  Social Work referral for depression.  Pharmacy referral for medication review. Self Care Activities:   . Patient will self administer medications as prescribed . Patient will attend all scheduled provider appointments . Patient will call pharmacy for medication refills . Patient will continue to perform ADL's independently . Patient will call provider office for new concerns or questions Patient Goals: Within the next 7 days, patient will contact [facility] to follow up. - Follow Up Plan: The patient has been provided with contact information for the care management team and has been advised to call with any health related questions or concerns.  The care management team will reach out to the patient again over the next 7 days.

## 2021-01-19 ENCOUNTER — Telehealth: Payer: Self-pay | Admitting: Nurse Practitioner

## 2021-01-19 NOTE — Telephone Encounter (Signed)
Attempted to reach Jill Shaw today to get her scheduled for a phone appt with the Managed Medicaid Pharmacist. I left my contact info on her cell phone.

## 2021-01-20 ENCOUNTER — Encounter: Payer: Self-pay | Admitting: Obstetrics and Gynecology

## 2021-01-20 ENCOUNTER — Other Ambulatory Visit: Payer: Self-pay

## 2021-01-20 ENCOUNTER — Ambulatory Visit (INDEPENDENT_AMBULATORY_CARE_PROVIDER_SITE_OTHER): Payer: Medicaid Other | Admitting: Obstetrics and Gynecology

## 2021-01-20 VITALS — BP 142/82 | HR 82 | Ht 64.0 in | Wt 268.0 lb

## 2021-01-20 DIAGNOSIS — Z3042 Encounter for surveillance of injectable contraceptive: Secondary | ICD-10-CM

## 2021-01-20 DIAGNOSIS — N946 Dysmenorrhea, unspecified: Secondary | ICD-10-CM

## 2021-01-20 DIAGNOSIS — N939 Abnormal uterine and vaginal bleeding, unspecified: Secondary | ICD-10-CM

## 2021-01-20 MED ORDER — MEDROXYPROGESTERONE ACETATE 150 MG/ML IM SUSP
150.0000 mg | Freq: Once | INTRAMUSCULAR | Status: AC
  Administered 2021-01-20: 150 mg via INTRAMUSCULAR

## 2021-01-20 NOTE — Progress Notes (Signed)
Patient declined to get undress due to stomach pain.

## 2021-01-20 NOTE — Progress Notes (Signed)
Obstetrics and Gynecology Visit Return Patient Evaluation  Appointment Date: 01/20/2021  Primary Care Provider: [facility]: [facility]  Chief Complaint: AUB surveillance  History of Present Illness:  Jill Shaw is a 45 y.o. P3 Patient last seen by me on 1/12 for surveillance and another depo provera injection. Unfortunately since that time, she was recently in the hospital, from 3/26 to 3/30, for AMS due to likely polypharmacy and taking her TCA and muscle relaxants together.   She states that her bleeding started in mid march and was heavy and then slowed down. She states that she didn't start any medications for the bleeding until something was recently called in for her when she let us know about the AUB on 3/30 ; she states that she did have bleeding while in the hospital. She states that  Yesterday she had two pads and four day before; she believes she is currently taking the a total of 35m of aygesting daily in divided doses. She also states mid and upper abdominal and back discomfort which she attributes to the AUB.   Pap and HPV negative 07/2017 Endometrial sampling negative 08/2019  Review of Systems:  12 point review of systems negative except for HPI and below  +vaginal bleeding, mid and upper abdominal and back discomfort.   Patient Active Problem List   Diagnosis Date Noted  . Amitriptyline overdose, accidental or unintentional, initial encounter 01/09/2021  . Ingestion of substance, undetermined intent, initial encounter 01/08/2021  . Hyperglycemia due to type 2 diabetes mellitus ([place] 01/08/2021  . SIRS (systemic inflammatory response syndrome) ([place] 01/08/2021  . Exertional chest pain 12/31/2020  . SOB (shortness of breath) 12/24/2020  . DKA (diabetic ketoacidosis) ([place] 10/15/2020  . Hyperkalemia 10/15/2020  . Severe sepsis ([place] 10/15/2020  . Acute metabolic encephalopathy 173/22/0254 . Sinus  tachycardia 10/14/2020  . Precordial pain 10/14/2020  . Abnormal findings on diagnostic imaging of lung 08/19/2020  . Physical deconditioning 08/19/2020  . History of endometrial ablation 07/27/2020  . History of alcohol use 07/20/2020  . Current moderate episode of major depressive disorder without prior episode ([place] 07/20/2020  . Sickle cell trait (HS[place] 07/19/2020  . Elevated d-dimer 07/13/2020  . Healthcare maintenance 07/12/2020  . Exertional dyspnea 07/12/2020  . Atypical chest pain 07/12/2020  . At risk for obstructive sleep apnea 07/12/2020  . DKA (diabetic ketoacidoses) 07/02/2020  . Acute on chronic diastolic (congestive) heart failure ([place] 06/22/2020  . Abdominal pain 03/08/2020  . Chronic diastolic CHF (congestive heart failure) (HV[place] 03/08/2020  . AKI (acute kidney injury) (HH[place] 01/29/2020  . Chronic anemia 12/19/2019  . Elevated sed rate 11/26/2019  . Elevated C-reactive protein (CRP) 11/26/2019  . Hypoglycemia 02/07/2019  . Hypotension 02/07/2019  . Lactic acidosis 02/07/2019  . Right ankle pain 02/07/2019  . Chronic obstructive pulmonary disease ([place] 02/05/2019  . Seizure ([place] 07/31/2018  . Vitamin D deficiency 05/31/2017  . Gastroesophageal reflux disease 05/24/2017  . Abnormal uterine bleeding (AUB) 05/17/2017  . Anemia of chronic disease 04/06/2017  . Knee pain, chronic 03/27/2016  . Controlled type 2 diabetes mellitus without complication, with long-term current use of insulin ([place] 03/27/2016  . Essential hypertension 03/27/2016  . Obesity, Class III, BMI 40-49.9 (morbid obesity) ([place] 03/27/2016  . Irritable bowel syndrome with constipation 03/27/2016  . Tobacco dependence 03/27/2016  . CKD (chronic kidney disease) stage 2, GFR 60-89 ml/min 03/25/2016  . Arthropathy, lower leg 05/04/2013  . CTS (carpal tunnel syndrome)  05/04/2013  . Peripheral edema 05/04/2013  . Chronic pain 05/13/2012  . Diabetic gastroparesis ([place]) 11/06/2006  . Hot flashes  11/06/2006   Medications:  Jill Shaw had no medications administered during this visit. Current Outpatient Medications  Medication Sig Dispense Refill  . ACCU-CHEK GUIDE test strip USE AS DIRECTED UP TO FOUR TIMES DAILY 100 strip 4  . albuterol (PROVENTIL) (2.5 MG/3ML) 0.083% nebulizer solution Take 3 mLs (2.5 mg total) by nebulization every 6 (six) hours as needed for wheezing or shortness of breath. 150 mL 6  . albuterol (VENTOLIN HFA) 108 (90 Base) MCG/ACT inhaler Inhale 2 puffs into the lungs every 6 (six) hours as needed for wheezing or shortness of breath. 8 g 6  . blood glucose meter kit and supplies KIT 1 each by Other route See admin instructions. Dispense based on patient and insurance preference. Use up to four times daily as directed. (FOR ICD-9 250.00, 250.01). 1 each 2  . budesonide-formoterol (SYMBICORT) 160-4.5 MCG/ACT inhaler INHALE 2 PUFFS INTO THE LUNGS 2 (TWO) TIMES DAILY. (Patient taking differently: Inhale 2 puffs into the lungs 2 (two) times daily.) 10.2 g 6  . diclofenac Sodium (VOLTAREN) 1 % GEL Apply 4 g topically 4 (four) times daily as needed (pain).    . ferrous sulfate 325 (65 FE) MG tablet Take 1 tablet (325 mg total) by mouth 3 (three) times daily with meals. 270 tablet 3  . fluticasone (FLONASE) 50 MCG/ACT nasal spray Place 2 sprays into both nostrils daily. 16 g 6  . furosemide (LASIX) 40 MG tablet Take 1 tablet (40 mg total) by mouth 2 (two) times daily. 60 tablet 5  . Glucosamine Sulfate 1000 MG CAPS Take 1 capsule (1,000 mg total) by mouth 2 (two) times daily. (Patient taking differently: Take 1,000 mg by mouth 2 (two) times daily.) 180 capsule 3  . hydrALAZINE (APRESOLINE) 50 MG tablet Take 50 mg by mouth 3 (three) times daily. 90 tablet 0  . hydrocortisone 2.5 % cream Apply topically 2 (two) times daily. (Patient taking differently: Apply 1 application topically 2 (two) times daily.) 30 g 11  . hydroquinone 4 % cream APPLY TOPICALLY TWICE DAILY (Patient  taking differently: Apply 1 application topically 2 (two) times daily as needed (pain).) 28.35 g 0  . Insulin Lispro Prot & Lispro (HUMALOG MIX 75/25 KWIKPEN) (75-25) 100 UNIT/ML Kwikpen 110 units every 12 hours 144 mL 11  . Insulin Pen Needle (PEN NEEDLES) 30G X 5 MM MISC 1 Units by Does not apply route as directed. 100 each 11  . lidocaine (LIDODERM) 5 % Place 1 patch onto the skin daily. Remove & Discard patch within 12 hours or as directed by MD 30 patch 0  . losartan (COZAAR) 50 MG tablet Take 1 tablet (50 mg total) by mouth daily. TAKE 1 TABLET(50MG TOTAL) BY MOUTH EVERY DAY 90 tablet 3  . melatonin 3 MG TABS tablet Take 1 tablet (3 mg total) by mouth at bedtime as needed.  0  . methocarbamol (ROBAXIN) 500 MG tablet Take 500-1,000 mg by mouth every 6 (six) hours as needed for muscle spasms.    . metoprolol succinate (TOPROL-XL) 200 MG 24 hr tablet Take 1 tablet (200 mg total) by mouth daily. Take with or immediately following a meal. (Patient taking differently: Take 200 mg by mouth daily.) 90 tablet 2  . norethindrone (AYGESTIN) 5 MG tablet Take 2 tablets (10 mg total) by mouth in the morning, at noon, in the evening, and at  bedtime. With bleeding 120 tablet 0  . ondansetron (ZOFRAN ODT) 4 MG disintegrating tablet Take 1 tablet (4 mg total) by mouth every 8 (eight) hours as needed for nausea or vomiting. 20 tablet 0  . pantoprazole (PROTONIX) 40 MG tablet Take 1 tablet (40 mg total) by mouth daily. 90 tablet 3  . potassium chloride SA (KLOR-CON) 20 MEQ tablet Take 20 mEq by mouth daily.    Marland Kitchen rOPINIRole (REQUIP) 0.25 MG tablet Take 1 tablet (0.25 mg total) by mouth 3 (three) times daily. 90 tablet 2  . rosuvastatin (CRESTOR) 5 MG tablet Take 1 tablet (5 mg total) by mouth daily. 90 tablet 3  . saxagliptin HCl (ONGLYZA) 5 MG TABS tablet Take 1 tablet (5 mg total) by mouth daily. 90 tablet 3  . spironolactone (ALDACTONE) 50 MG tablet TAKE 1 TABLET(50 MG) BY MOUTH DAILY (Patient taking differently:  Take 50 mg by mouth daily.) 30 tablet 3  . Tiotropium Bromide Monohydrate (SPIRIVA RESPIMAT) 2.5 MCG/ACT AERS Inhale 2 puffs into the lungs daily. 4 g 6  . Vitamin D, Ergocalciferol, (DRISDOL) 1.25 MG (50000 UNIT) CAPS capsule Take 1 capsule (50,000 Units total) by mouth every 7 (seven) days. (Patient taking differently: Take 50,000 Units by mouth every Friday.) 5 capsule 6  . vitamin E (VITAMIN E) 180 MG (400 UNITS) capsule Take 1 capsule (400 Units total) by mouth daily. 30 capsule 1  . buPROPion (WELLBUTRIN SR) 150 MG 12 hr tablet Take 1 tablet (150 mg total) by mouth 2 (two) times daily. (Patient not taking: No sig reported) 60 tablet 11  . HYDROcodone-acetaminophen (NORCO/VICODIN) 5-325 MG tablet Take 1 tablet by mouth 3 (three) times daily as needed for moderate pain or severe pain. (Patient not taking: Reported on 01/20/2021) 10 tablet 0  . hydrocortisone 2.5 % cream APPLY TOPICALLY 2 (TWO) TIMES DAILY. (Patient not taking: No sig reported) 30 g 2  . nicotine (NICODERM CQ) 21 mg/24hr patch Place 1 patch (21 mg total) onto the skin daily. (Patient not taking: Reported on 01/20/2021) 28 patch 3   No current facility-administered medications for this visit.    Allergies: is allergic to ketoprofen, aspirin, gabapentin, ibuprofen, liraglutide, naproxen, omeprazole-sodium bicarbonate, sulfa antibiotics, and tramadol.  Physical Exam:  BP (!) 142/82   Pulse 82   Ht _0  (1.626 m)   Wt 268 lb (121.6 kg)   LMP 01/08/2021 (Exact Date)   BMI 46.00 kg/m  Body mass index is 46 kg/m. General appearance: Well nourished, well developed female in no acute distress.  Cardiology: normal s1 and s2, no MRGs Pulmonary: CTAB Abdomen: soft, nttp, nd, obese, +BS Back: no CVAT Ext: trace b/l LE edema Neuro: grossly normal Psych:  Normal mood and affect.   Pelvic: deferred  Labs: CBC Latest Ref Rng & Units 01/12/2021 01/11/2021 01/10/2021  WBC 4.0 - 10.5 K/uL 12.8(H) 12.0(H) 10.1  Hemoglobin 12.0 - 15.0  g/dL 10.2(L) 10.1(L) 9.5(L)  Hematocrit 36.0 - 46.0 % 33.2(L) 33.2(L) 32.0(L)  Platelets 150 - 400 K/uL 288 258 266   Radiology: None  Assessment: pt stable  Plan: I told her that hopefully her being due for her depo provera injection is what caused the AUB and getting another one today will help with the bleeding and discomfort; in the interim, aygestin taper instructions prescribed for her in (see patient instructions).   In terms of the discomfort, she is seeing various providers for this and I told her that she needs to really only have one provider  prescribing her pain medications, especially given her most recent hospitalization. She is understanding of this.   RTC: 76mfor depo injection and follow up  CDurene RomansMD Attending Center for [facility]

## 2021-01-20 NOTE — Patient Instructions (Signed)
Take two pills of the aygestin when you wake up, two early afternoon, two early evening and two before bed.  Do this for 5 days   And then two pills with breakfast, two with lunch and two with dinner   Do this for 3 days  Then two pills with breakfast and two before bed   Do this for 3 days  Then two pills in the morning   Do this for 3 days and stop

## 2021-01-24 ENCOUNTER — Telehealth: Payer: Self-pay | Admitting: Nurse Practitioner

## 2021-01-24 NOTE — Telephone Encounter (Signed)
I attempted to reach Jill Shaw again today to get her scheduled for a phone visit with the Managed Medicaid Pharmacist and the Social Worker. I left my name and number on her VM.

## 2021-01-25 NOTE — Telephone Encounter (Signed)
Jill Shaw has requested medication for sleep & pain. Per patient she is only getting 4-5 hours of sleep nightly. Because she has pain in her back, legs, and arms.   Call back phone [phone].

## 2021-01-25 NOTE — Telephone Encounter (Signed)
This medication was prescribed by Dr. [name] and needs to be refilled by him, thank you!

## 2021-01-26 ENCOUNTER — Other Ambulatory Visit: Payer: Self-pay | Admitting: Obstetrics and Gynecology

## 2021-01-26 NOTE — Patient Instructions (Signed)
Hi Ms. Sutphen, I am sorry I missed you today, I hope you are doing okay- as a part of your Medicaid benefit, you are eligible for care management and care coordination services at no cost or copay. I was unable to reach you by phone today but would be happy to help you with your health related needs. Please feel free to call me at [phone].  A member of the Managed Medicaid care management team will reach out to you again over the next 7 days.   Kathi Der RN, BSN [place]  Triad Engineer, production - Managed Medicaid High Risk [phone].

## 2021-01-26 NOTE — Patient Outreach (Signed)
Care Coordination  01/26/2021  Jill Shaw 1976-01-14 [id number]    Medicaid Managed Care   Unsuccessful Outreach Note  01/26/2021 Name: Jill Shaw MRN: [id number] DOB: 08-10-1976  Referred by: Barbette Merino, NP Reason for referral : High Risk Managed Medicaid (Unsuccessful telephone outreach)   An unsuccessful telephone outreach was attempted today. The patient was referred to the case management team for assistance with care management and care coordination.   Follow Up Plan: A member of the Managed Medicaid care management team will reach out to the patient again over the next 7 days.   Kathi Der RN, BSN [place]  Triad Engineer, production - Managed Medicaid High Risk [phone].

## 2021-01-31 ENCOUNTER — Encounter: Payer: Self-pay | Admitting: Physical Medicine and Rehabilitation

## 2021-01-31 ENCOUNTER — Other Ambulatory Visit: Payer: Medicaid Other

## 2021-01-31 ENCOUNTER — Encounter
Payer: Medicaid Other | Attending: Physical Medicine and Rehabilitation | Admitting: Physical Medicine and Rehabilitation

## 2021-01-31 ENCOUNTER — Other Ambulatory Visit: Payer: Self-pay

## 2021-01-31 VITALS — BP 146/86 | HR 122 | Temp 99.5°F | Ht 64.0 in | Wt 262.6 lb

## 2021-01-31 DIAGNOSIS — Z5181 Encounter for therapeutic drug level monitoring: Secondary | ICD-10-CM | POA: Diagnosis not present

## 2021-01-31 DIAGNOSIS — F5101 Primary insomnia: Secondary | ICD-10-CM | POA: Diagnosis not present

## 2021-01-31 DIAGNOSIS — Z79899 Other long term (current) drug therapy: Secondary | ICD-10-CM

## 2021-01-31 DIAGNOSIS — G894 Chronic pain syndrome: Secondary | ICD-10-CM | POA: Diagnosis not present

## 2021-01-31 DIAGNOSIS — M792 Neuralgia and neuritis, unspecified: Secondary | ICD-10-CM | POA: Diagnosis not present

## 2021-01-31 DIAGNOSIS — S22000A Wedge compression fracture of unspecified thoracic vertebra, initial encounter for closed fracture: Secondary | ICD-10-CM | POA: Diagnosis not present

## 2021-01-31 DIAGNOSIS — Z79891 Long term (current) use of opiate analgesic: Secondary | ICD-10-CM | POA: Diagnosis not present

## 2021-01-31 MED ORDER — TOPIRAMATE 25 MG PO TABS: 25.0000 mg | ORAL_TABLET | Freq: Two times a day (BID) | ORAL | 1 refills | Status: DC

## 2021-01-31 MED ORDER — TRAZODONE HCL 100 MG PO TABS: 100.0000 mg | ORAL_TABLET | Freq: Every day | ORAL | 1 refills | Status: DC

## 2021-01-31 NOTE — Patient Instructions (Signed)
-  Discussed following foods that may reduce pain: 1) Ginger 2) Blueberries 3) Salmon 4) Pumpkin seeds 5) dark chocolate 6) turmeric 7) tart cherries 8) virgin olive oil 9) chilli peppers 10) mint 
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
## 2021-02-01 ENCOUNTER — Telehealth: Payer: Self-pay | Admitting: Nurse Practitioner

## 2021-02-01 NOTE — Telephone Encounter (Signed)
Today was my 2nd attempt to reach Jill Shaw to get her scheduled for phone visits with the [employer] social worker and the pharmacist. I left my name and number for her to return my call.

## 2021-02-04 LAB — DRUG TOX MONITOR 1 W/CONF, ORAL FLD
Amobarbital: NEGATIVE ng/mL (ref <10)
Amphetamines: NEGATIVE ng/mL (ref <10)
Barbiturates: NEGATIVE ng/mL (ref <10)
Benzodiazepines: NEGATIVE ng/mL (ref <0.50)
Buprenorphine: NEGATIVE ng/mL (ref <0.10)
Butalbital: NEGATIVE ng/mL (ref <10)
Cocaine: NEGATIVE ng/mL (ref <5.0)
Cotinine: >250 ng/mL — ABNORMAL HIGH (ref <5.0)
Fentanyl: NEGATIVE ng/mL (ref <0.10)
Heroin Metabolite: NEGATIVE ng/mL (ref <1.0)
MARIJUANA: NEGATIVE ng/mL (ref <2.5)
MDMA: NEGATIVE ng/mL (ref <10)
Meprobamate: NEGATIVE ng/mL (ref <2.5)
Methadone: NEGATIVE ng/mL (ref <5.0)
Nicotine Metabolite: POSITIVE ng/mL — AB (ref <5.0)
Opiates: NEGATIVE ng/mL (ref <2.5)
Pentobarbital: NEGATIVE ng/mL (ref <10)
Phencyclidine: NEGATIVE ng/mL (ref <10)
Phenobarbital: NEGATIVE ng/mL (ref <10)
Secobarbital: NEGATIVE ng/mL (ref <10)
Tapentadol: NEGATIVE ng/mL (ref <5.0)
Tramadol: NEGATIVE ng/mL (ref <5.0)
Zolpidem: NEGATIVE ng/mL (ref <5.0)

## 2021-02-04 LAB — DRUG TOX ALC METAB W/CON, ORAL FLD: Alcohol Metabolite: NEGATIVE ng/mL (ref <25)

## 2021-02-06 ENCOUNTER — Other Ambulatory Visit: Payer: Self-pay | Admitting: Nurse Practitioner

## 2021-02-07 ENCOUNTER — Other Ambulatory Visit: Payer: Medicaid Other

## 2021-02-07 ENCOUNTER — Telehealth: Payer: Self-pay | Admitting: *Deleted

## 2021-02-07 NOTE — Telephone Encounter (Signed)
Oral swab drug screen was consistent for prescribed medications.  ?

## 2021-02-08 ENCOUNTER — Telehealth: Payer: Self-pay

## 2021-02-08 ENCOUNTER — Other Ambulatory Visit: Payer: Self-pay | Admitting: Cardiovascular Disease

## 2021-02-08 ENCOUNTER — Ambulatory Visit: Payer: Medicaid Other | Admitting: Physical Medicine and Rehabilitation

## 2021-02-08 ENCOUNTER — Other Ambulatory Visit: Payer: Self-pay | Admitting: Physical Medicine and Rehabilitation

## 2021-02-08 DIAGNOSIS — I10 Essential (primary) hypertension: Secondary | ICD-10-CM

## 2021-02-08 MED ORDER — HYDROCODONE-ACETAMINOPHEN 5-325 MG PO TABS: 2.0000 | ORAL_TABLET | Freq: Two times a day (BID) | ORAL | 0 refills | Status: DC | PRN

## 2021-02-08 NOTE — Telephone Encounter (Signed)
Jill Shaw called to request her pain medicine. Per patient the last medication made her sick on the stomach.   Call back phone  For [name] is [phone].

## 2021-02-10 ENCOUNTER — Telehealth: Payer: Self-pay | Admitting: Nurse Practitioner

## 2021-02-10 NOTE — Telephone Encounter (Signed)
Today was my third attempt to reach Jill Shaw to get her scheduled for a phone visit with the [facility] Pharmacist and the [facility] LCSW. I left my name and number again on her VM.

## 2021-02-10 NOTE — Progress Notes (Signed)
Primary Physician/Referring:  Barbette Merino, NP  Patient ID: Jill Shaw, female    DOB: 1976-04-02, 44 y.o.   MRN: [id number]  Chief Complaint  Patient presents with  . Chest Pain  . Shortness of Breath  . Palpitations   HPI:    Jill Shaw  is a 45 y.o. African-American female with hypertension, type 2 diabetes mellitus, HFpEF, COPD, tobacco dependence, morbid obesity, microcytic anemia, uterine dysfunction, recurrent syncope.   Patient presents for urgent visit with complaints of chest pain worse with arm movement, bending over, and pushing up off the toilet to stand up.  This pain lasts several seconds and she describes it as sharp.  Patient has also been having shortness of breath both with exertion and at rest which she states is chronic and unchanged compared to baseline.  She also notes palpitations worse with exertion and relieved with rest occurring nearly daily over the last 3-4 weeks.  Reports home blood pressure readings averaging 130/80 mmHg.  She has discontinued hydralazine due to dizziness.  Patient has still not undergone outpatient stress testing as previously recommended.  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  Past Medical History:  Diagnosis Date  . Anemia   . Arthritis    knees, hands  . Asthma   . Chronic diastolic (congestive) heart failure (HCC)   . COPD (chronic obstructive pulmonary disease) (HCC)   . Diabetes mellitus without complication (HCC)    type 2  . Dysfunctional uterine bleeding   . GERD (gastroesophageal reflux disease)   . Hypertension   . Neuromuscular disorder (HCC)    neuropathy feet  . Seizures (HCC) 09/12/2017   pt states r/t stress and blood sugar - no meds last one 4 months ago, not seen neurologist  . Sickle cell trait (HCC)   . Smoker   . Vitamin D deficiency 10/2019  . Wears glasses    Past Surgical History:  Procedure Laterality Date  . CESAREAN SECTION      x 1. for twins  . DILATION AND CURETTAGE OF UTERUS N/A 08/20/2019   Procedure: DILATATION AND CURETTAGE;  Surgeon: Allie Bossier, MD;  Location: [facility] OR;  Service: Gynecology;  Laterality: N/A;  . ENDOMETRIAL ABLATION N/A 08/20/2019   Procedure: Minerva Ablation;  Surgeon: Allie Bossier, MD;  Location: [facility] OR;  Service: Gynecology;  Laterality: N/A;  . EYE SURGERY Bilateral    laser right and cataract removed left eye  . RADIOLOGY WITH ANESTHESIA N/A 09/16/2019   Procedure: MRI WITH ANESTHESIA   L SPINE WITHOUT CONTRAST, T SPINE WITHOUT CONTRAST , CERVICAL WITHOUT CONTRAST;  Surgeon: Radiologist, Medication, MD;  Location: [facility] OR;  Service: Radiology;  Laterality: N/A;  . TUBAL LIGATION     interval BTL  . UPPER GI ENDOSCOPY  07/2017   Family History  Problem Relation Age of Onset  . Diabetes Mother   . Hypertension Mother     Social History   Tobacco Use  . Smoking status: Former Smoker    Packs/day: 0.25    Years: 26.00    Pack years: 6.50    Types: Cigarettes    Quit date: 09/13/2020    Years since quitting: 0.4  . Smokeless tobacco: Never Used  . Tobacco comment: 4-5 cigarettes/day  Substance Use Topics  . Alcohol use: No   Marital Status: Legally Separated   ROS  Review of Systems  Constitutional: Negative for malaise/fatigue and weight  gain.  Cardiovascular: Positive for chest pain and palpitations. Negative for claudication, leg swelling, near-syncope, orthopnea, paroxysmal nocturnal dyspnea and syncope.  Respiratory: Positive for shortness of breath.   Hematologic/Lymphatic: Does not bruise/bleed easily.  Gastrointestinal: Negative for melena.  Neurological: Negative for dizziness and weakness.    Objective  Blood pressure (!) 156/98, pulse (!) 108, temperature 97.8 F (36.6 C), temperature source Temporal, resp. rate 17, height $RemoveBe'5\' 4"'mNoEtzHuD$  (1.626 m), weight 265 lb (120.2 kg), SpO2 100 %.  Vitals with BMI 02/11/2021 02/11/2021 01/31/2021  Height - $Remove'5\' 4"'sTiGimX$  $RemoveB'5\' 4"'wFwPdfDw$   Weight - 265 lbs  262 lbs 10 oz  BMI - 30.09 23.30  Systolic 076 226 333  Diastolic 98 83 86  Pulse 545 105 122      Physical Exam Vitals reviewed.  Constitutional:      Appearance: She is obese.  HENT:     Head: Normocephalic and atraumatic.  Cardiovascular:     Rate and Rhythm: Regular rhythm. Tachycardia present.     Pulses: Intact distal pulses.     Heart sounds: S1 normal and S2 normal. No murmur heard. No gallop.   Pulmonary:     Effort: Pulmonary effort is normal. No respiratory distress.     Breath sounds: No wheezing, rhonchi or rales.  Abdominal:     General: Bowel sounds are normal.     Palpations: Abdomen is soft.  Musculoskeletal:     Right lower leg: No edema.     Left lower leg: No edema.  Neurological:     Mental Status: She is alert.     Laboratory examination:   Recent Labs    07/26/20 1202 09/20/20 1220 10/15/20 1742 12/02/20 1405 12/23/20 1747 01/10/21 1558 01/11/21 0446 01/12/21 0618  NA 140 137   < > 136   < > 139 140 142  K 4.0 4.7   < > 3.8   < > 3.7 3.6 4.5  CL 102 103   < > 90*   < > 106 107 111  CO2 22 18*   < >  --    < > 26 23 18*  GLUCOSE 160* 356*   < > 536*   < > 243* 130* 115*  BUN 10 16   < > 10   < > $R'8 8 11  'br$ CREATININE 1.13* 1.25*   < > 1.48*   < > 1.41* 1.20* 1.15*  CALCIUM 8.7 9.2   < > 9.2   < > 9.3 9.2 9.3  GFRNONAA 60 53*   < > 43*   < > 47* 57* >60  GFRAA 69 61  --  49*  --   --   --   --    < > = values in this interval not displayed.   CrCl cannot be calculated (Patient's most recent lab result is older than the maximum 21 days allowed.).  CMP Latest Ref Rng & Units 01/12/2021 01/11/2021 01/10/2021  Glucose 70 - 99 mg/dL 115(H) 130(H) 243(H)  BUN 6 - 20 mg/dL $Remove'11 8 8  'XWEkMEC$ Creatinine 0.44 - 1.00 mg/dL 1.15(H) 1.20(H) 1.41(H)  Sodium 135 - 145 mmol/L 142 140 139  Potassium 3.5 - 5.1 mmol/L 4.5 3.6 3.7  Chloride 98 - 111 mmol/L 111 107 106  CO2 22 - 32 mmol/L 18(L) 23 26  Calcium 8.9 - 10.3 mg/dL 9.3 9.2 9.3  Total Protein 6.5 - 8.1 g/dL  - - 6.8  Total Bilirubin 0.3 - 1.2 mg/dL - - 0.6  Alkaline  Phos 38 - 126 U/L - - 73  AST 15 - 41 U/L - - 22  ALT 0 - 44 U/L - - 10   CBC Latest Ref Rng & Units 01/12/2021 01/11/2021 01/10/2021  WBC 4.0 - 10.5 K/uL 12.8(H) 12.0(H) 10.1  Hemoglobin 12.0 - 15.0 g/dL 10.2(L) 10.1(L) 9.5(L)  Hematocrit 36.0 - 46.0 % 33.2(L) 33.2(L) 32.0(L)  Platelets 150 - 400 K/uL 288 258 266    Lipid Panel Recent Labs    06/02/20 1352 11/20/20 1034  CHOL 135  --   TRIG 166* 280*  LDLCALC 77  --   HDL 29*  --   CHOLHDL 4.7*  --     HEMOGLOBIN A1C Lab Results  Component Value Date   HGBA1C 9.7 (H) 12/23/2020   MPG 231.69 12/23/2020   TSH Recent Labs    12/02/20 1405 01/10/21 1558  TSH 1.250 0.969    External labs:  12/26/2020: Glucose 243, BUN/Cr 10/1.17. EGFR 49. Na/K 134/4.6 Trop HS 5,4 H/H 9.8/32.3. MCV 74. Platelets 313 HbA1C 9.7%  07/26/2020: Glucose 160, BUN/Cr 10/1.13. EGFR 60. Na/K 140/4.0. Rest of the CMP normal H/H 9.5/34.7. MCV 70. Platelets 311 HbA1C 9.2% Chol 135, TG 166, HDL 29, LDL 77  Medications and allergies   Allergies  Allergen Reactions  . Ketoprofen Nausea And Vomiting  . Aspirin Nausea Only  . Gabapentin Nausea And Vomiting and Other (See Comments)    upset stomach  . Ibuprofen Nausea And Vomiting  . Liraglutide Nausea And Vomiting  . Naproxen Nausea And Vomiting  . Omeprazole-Sodium Bicarbonate Nausea And Vomiting  . Sulfa Antibiotics Nausea And Vomiting  . Tramadol Nausea And Vomiting and Other (See Comments)    stomach upset     Outpatient Medications Prior to Visit  Medication Sig Dispense Refill  . ACCU-CHEK GUIDE test strip USE AS DIRECTED UP TO FOUR TIMES DAILY 100 strip 4  . albuterol (PROVENTIL) (2.5 MG/3ML) 0.083% nebulizer solution Take 3 mLs (2.5 mg total) by nebulization every 6 (six) hours as needed for wheezing or shortness of breath. 150 mL 6  . albuterol (VENTOLIN HFA) 108 (90 Base) MCG/ACT inhaler Inhale 2 puffs into the lungs  every 6 (six) hours as needed for wheezing or shortness of breath. 8 g 6  . blood glucose meter kit and supplies KIT 1 each by Other route See admin instructions. Dispense based on patient and insurance preference. Use up to four times daily as directed. (FOR ICD-9 250.00, 250.01). 1 each 2  . budesonide-formoterol (SYMBICORT) 160-4.5 MCG/ACT inhaler INHALE 2 PUFFS INTO THE LUNGS 2 (TWO) TIMES DAILY. (Patient taking differently: Inhale 2 puffs into the lungs 2 (two) times daily.) 10.2 g 6  . buPROPion (WELLBUTRIN SR) 150 MG 12 hr tablet Take 1 tablet (150 mg total) by mouth 2 (two) times daily. 60 tablet 11  . diclofenac Sodium (VOLTAREN) 1 % GEL Apply 4 g topically 4 (four) times daily as needed (pain).    . ferrous sulfate 325 (65 FE) MG tablet Take 1 tablet (325 mg total) by mouth 3 (three) times daily with meals. 270 tablet 3  . fluticasone (FLONASE) 50 MCG/ACT nasal spray Place 2 sprays into both nostrils daily. 16 g 6  . furosemide (LASIX) 40 MG tablet TAKE 1 TABLET(40 MG) BY MOUTH TWICE DAILY 180 tablet 1  . Glucosamine Sulfate 1000 MG CAPS Take 1 capsule (1,000 mg total) by mouth 2 (two) times daily. (Patient taking differently: Take 1,000 mg by mouth 2 (two) times daily.) 180  capsule 3  . HYDROcodone-acetaminophen (NORCO/VICODIN) 5-325 MG tablet Take 2 tablets by mouth 2 (two) times daily as needed for moderate pain. 60 tablet 0  . hydrocortisone 2.5 % cream Apply topically 2 (two) times daily. (Patient taking differently: Apply 1 application topically 2 (two) times daily.) 30 g 11  . hydroquinone 4 % cream APPLY TOPICALLY TWICE DAILY (Patient taking differently: Apply 1 application topically 2 (two) times daily as needed (pain).) 28.35 g 0  . Insulin Lispro Prot & Lispro (HUMALOG MIX 75/25 KWIKPEN) (75-25) 100 UNIT/ML Kwikpen 110 units every 12 hours 144 mL 11  . Insulin Pen Needle (PEN NEEDLES) 30G X 5 MM MISC 1 Units by Does not apply route as directed. 100 each 11  . lidocaine (LIDODERM) 5  % Place 1 patch onto the skin daily. Remove & Discard patch within 12 hours or as directed by MD 30 patch 0  . losartan (COZAAR) 50 MG tablet Take 1 tablet (50 mg total) by mouth daily. TAKE 1 TABLET($RemoveBefor'50MG'NGKhJXvRRPlu$  TOTAL) BY MOUTH EVERY DAY 90 tablet 3  . melatonin 3 MG TABS tablet Take 1 tablet (3 mg total) by mouth at bedtime as needed.  0  . methocarbamol (ROBAXIN) 500 MG tablet Take 500-1,000 mg by mouth every 6 (six) hours as needed for muscle spasms.    . metoprolol succinate (TOPROL-XL) 200 MG 24 hr tablet Take 1 tablet (200 mg total) by mouth daily. Take with or immediately following a meal. (Patient taking differently: Take 200 mg by mouth daily.) 90 tablet 2  . nicotine (NICODERM CQ) 21 mg/24hr patch Place 1 patch (21 mg total) onto the skin daily. 28 patch 3  . norethindrone (AYGESTIN) 5 MG tablet Take 2 tablets (10 mg total) by mouth in the morning, at noon, in the evening, and at bedtime. With bleeding 120 tablet 0  . ondansetron (ZOFRAN ODT) 4 MG disintegrating tablet Take 1 tablet (4 mg total) by mouth every 8 (eight) hours as needed for nausea or vomiting. 20 tablet 0  . pantoprazole (PROTONIX) 40 MG tablet Take 1 tablet (40 mg total) by mouth daily. 90 tablet 3  . potassium chloride SA (KLOR-CON) 20 MEQ tablet Take 20 mEq by mouth daily.    Marland Kitchen rOPINIRole (REQUIP) 0.25 MG tablet Take 1 tablet (0.25 mg total) by mouth 3 (three) times daily. 90 tablet 2  . rosuvastatin (CRESTOR) 5 MG tablet Take 1 tablet (5 mg total) by mouth daily. 90 tablet 3  . saxagliptin HCl (ONGLYZA) 5 MG TABS tablet Take 1 tablet (5 mg total) by mouth daily. 90 tablet 3  . spironolactone (ALDACTONE) 50 MG tablet TAKE 1 TABLET(50 MG) BY MOUTH DAILY (Patient taking differently: Take 50 mg by mouth daily.) 30 tablet 3  . Tiotropium Bromide Monohydrate (SPIRIVA RESPIMAT) 2.5 MCG/ACT AERS Inhale 2 puffs into the lungs daily. 4 g 6  . topiramate (TOPAMAX) 25 MG tablet Take 1 tablet (25 mg total) by mouth 2 (two) times daily. 30  tablet 1  . traZODone (DESYREL) 100 MG tablet Take 1 tablet (100 mg total) by mouth at bedtime. 30 tablet 1  . Vitamin D, Ergocalciferol, (DRISDOL) 1.25 MG (50000 UNIT) CAPS capsule Take 1 capsule (50,000 Units total) by mouth every 7 (seven) days. (Patient taking differently: Take 50,000 Units by mouth every Friday.) 5 capsule 6  . vitamin E (VITAMIN E) 180 MG (400 UNITS) capsule Take 1 capsule (400 Units total) by mouth daily. 30 capsule 1  . hydrALAZINE (APRESOLINE) 50 MG tablet  Take 50 mg by mouth 3 (three) times daily. 90 tablet 0   No facility-administered medications prior to visit.     Radiology:   No results found.  Cardiac Studies:   Vascular US 07/16/2020: No DVT  Echocardiogram 04/30/2020: 1. Left ventricular ejection fraction, by estimation, is 55 to 60%. The  left ventricle has normal function. The left ventricle has no regional  wall motion abnormalities. There is moderate concentric left ventricular  hypertrophy. Left ventricular  diastolic parameters are consistent with Grade I diastolic dysfunction  (impaired relaxation). Elevated left ventricular end-diastolic pressure.  2. Right ventricular systolic function is normal. The right ventricular  size is normal.  3. The mitral valve is normal in structure. Trivial mitral valve  regurgitation. No evidence of mitral stenosis.  4. The aortic valve is normal in structure. Aortic valve regurgitation is  not visualized. No aortic stenosis is present.  5. The inferior vena cava is dilated in size with <50% respiratory  variability, suggesting right atrial pressure of 15 mmHg.   EKG:   EKG 02/11/2021: Sinus tachycardia rate of 106 bpm.  Left axis, left anterior fascicular block.  Poor R wave progression, cannot exclude anteroseptal infarct old.  EKG 10/14/2020: Sinus tachycardia 111 bpm  Cannot exclude old anteroseptal infarct  EKG 09/01/2020: Sinus rhythm 85 bpm  Poor R wave progression Otherwise normal  EKG  Assessment     ICD-10-CM   1. Exertional dyspnea  R06.00 PCV ECHOCARDIOGRAM COMPLETE    Basic metabolic panel    Brain natriuretic peptide  2. Exertional chest pain  R07.9 PCV ECHOCARDIOGRAM COMPLETE  3. Essential hypertension  I10 EKG 76-EGBT    Basic metabolic panel  4. Palpitations  R00.2 LONG TERM MONITOR (3-14 DAYS)     Medications Discontinued During This Encounter  Medication Reason  . hydrALAZINE (APRESOLINE) 50 MG tablet Side effect (s)  . diltiazem (CARDIZEM CD) 180 MG 24 hr capsule Entry Error    Meds ordered this encounter  Medications  . DISCONTD: diltiazem (CARDIZEM CD) 180 MG 24 hr capsule    Sig: Take 1 capsule (180 mg total) by mouth daily.    Dispense:  30 capsule    Refill:  3  . hydrochlorothiazide (MICROZIDE) 12.5 MG capsule    Sig: Take 1 capsule (12.5 mg total) by mouth daily.    Dispense:  30 capsule    Refill:  3    Recommendations:   Jill Shaw is a 45 y.o. African-American female with hypertension, type 2 diabetes mellitus, HFpEF, COPD, tobacco dependence, morbid obesity, microcytic anemia, uterine dysfunction, recurrent syncope.   Patient presents for urgent visit with complaints of chest pain and palpitations.   Exertional chest pain, dyspnea: Patient has history of exertional chest pain and dyspnea, however symptoms today seem more consistent with musculoskeletal etiology as chest pain is worse with arm movement.  However patient does have multiple cardiovascular risk factors, therefore will obtain Lexiscan nuclear stress test and echocardiogram.  Underlying etiologies contributing to chest pain and dyspnea include hypertension, anemia, and obesity. EKG today unchanged compared to previous.  Chronic heart failure with preserved ejection fraction (HCC) Currently euvolumic on exam.  However patient complains of continued dyspnea and chest pain.  Will obtain BNP. Likely related to hypertension, obesity, controlled type 2 diabetes  mellitus.   Continue current medical management.  Hypertension Uncontrolled. Patient is also tachycardic, therefore consider diltiazem.  However Will not initiate diltiazem at this time as echocardiogram is pending.  We will therefore start  hydrochlorothiazide 12.5 mg daily with repeat BMP in 1 week.  Palpitations: Patient is having near daily palpitations over the last few weeks.  We will obtain ambulatory cardiac telemetry to evaluate for underlying cardiac arrhythmias.  Controlled type 2 diabetes mellitus: Needs aggressive management. Defer to PCP.  Follow-up in 6 weeks, sooner if needed, for results of cardiac testing.    Alethia Berthold, PA-C 02/14/2021, 11:02 AM Office: [phone]

## 2021-02-11 ENCOUNTER — Encounter: Payer: Self-pay | Admitting: Student

## 2021-02-11 ENCOUNTER — Other Ambulatory Visit: Payer: Self-pay

## 2021-02-11 ENCOUNTER — Ambulatory Visit: Payer: Medicaid Other | Admitting: Student

## 2021-02-11 VITALS — BP 156/98 | HR 108 | Temp 97.8°F | Resp 17 | Ht 64.0 in | Wt 265.0 lb

## 2021-02-11 DIAGNOSIS — R0609 Other forms of dyspnea: Secondary | ICD-10-CM

## 2021-02-11 DIAGNOSIS — R002 Palpitations: Secondary | ICD-10-CM | POA: Diagnosis not present

## 2021-02-11 DIAGNOSIS — I10 Essential (primary) hypertension: Secondary | ICD-10-CM

## 2021-02-11 DIAGNOSIS — R079 Chest pain, unspecified: Secondary | ICD-10-CM

## 2021-02-11 DIAGNOSIS — R0600 Dyspnea, unspecified: Secondary | ICD-10-CM

## 2021-02-11 MED ORDER — DILTIAZEM HCL ER COATED BEADS 180 MG PO CP24: 180.0000 mg | ORAL_CAPSULE | Freq: Every day | ORAL | 3 refills | Status: DC

## 2021-02-11 MED ORDER — HYDROCHLOROTHIAZIDE 12.5 MG PO CAPS: 12.5000 mg | ORAL_CAPSULE | Freq: Every day | ORAL | 3 refills | Status: DC

## 2021-02-14 ENCOUNTER — Other Ambulatory Visit: Payer: Self-pay | Admitting: Physical Medicine and Rehabilitation

## 2021-02-14 MED ORDER — HEATING PAD MOIST/DRY KING SZ PADS: 1.0000 | MEDICATED_PAD | Freq: Four times a day (QID) | 1 refills | Status: DC | PRN

## 2021-02-15 ENCOUNTER — Ambulatory Visit: Payer: Medicaid Other | Admitting: Physical Medicine and Rehabilitation

## 2021-02-16 ENCOUNTER — Encounter: Payer: Self-pay | Admitting: Internal Medicine

## 2021-02-16 ENCOUNTER — Other Ambulatory Visit: Payer: Self-pay

## 2021-02-16 ENCOUNTER — Ambulatory Visit (INDEPENDENT_AMBULATORY_CARE_PROVIDER_SITE_OTHER): Payer: Medicaid Other | Admitting: Internal Medicine

## 2021-02-16 ENCOUNTER — Other Ambulatory Visit: Payer: Self-pay | Admitting: Nurse Practitioner

## 2021-02-16 VITALS — BP 148/82 | HR 112 | Ht 64.0 in | Wt 261.2 lb

## 2021-02-16 DIAGNOSIS — E785 Hyperlipidemia, unspecified: Secondary | ICD-10-CM | POA: Insufficient documentation

## 2021-02-16 DIAGNOSIS — E8881 Metabolic syndrome: Secondary | ICD-10-CM | POA: Diagnosis not present

## 2021-02-16 DIAGNOSIS — Z794 Long term (current) use of insulin: Secondary | ICD-10-CM

## 2021-02-16 DIAGNOSIS — E119 Type 2 diabetes mellitus without complications: Secondary | ICD-10-CM | POA: Diagnosis not present

## 2021-02-16 DIAGNOSIS — E88819 Insulin resistance, unspecified: Secondary | ICD-10-CM | POA: Insufficient documentation

## 2021-02-16 LAB — POCT GLUCOSE (DEVICE FOR HOME USE): POC Glucose: 381 mg/dl — AB (ref 70–99)

## 2021-02-16 LAB — POCT GLYCOSYLATED HEMOGLOBIN (HGB A1C): Hemoglobin A1C: 10.7 % — AB (ref 4.0–5.6)

## 2021-02-16 MED ORDER — DAPAGLIFLOZIN PROPANEDIOL 5 MG PO TABS: 5.0000 mg | ORAL_TABLET | Freq: Every day | ORAL | 6 refills | Status: DC

## 2021-02-16 MED ORDER — TOUJEO MAX SOLOSTAR 300 UNIT/ML ~~LOC~~ SOPN: 110.0000 [IU] | PEN_INJECTOR | Freq: Every day | SUBCUTANEOUS | 3 refills | Status: DC

## 2021-02-16 MED ORDER — INSULIN PEN NEEDLE 31G X 8 MM MISC: 1.0000 | Freq: Four times a day (QID) | 3 refills | Status: DC

## 2021-02-16 MED ORDER — HUMALOG KWIKPEN 200 UNIT/ML ~~LOC~~ SOPN: PEN_INJECTOR | SUBCUTANEOUS | 3 refills | Status: DC
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
## 2021-02-16 NOTE — Patient Instructions (Addendum)
-   Start Farxiga 5 mg , 1 tablet in the morning - Continue Onglyza 5 mg , 1 tablet daily  - Stop Humalog mix  - Start Toujeo 110 units once daily  - Start Humalog 40 units with each meal     24-Hour Urine Collection   You will be collecting your urine for a 24-hour period of time.  Your timer starts with your first urine of the morning (For example - If you first pee at 9AM, your timer will start at 9AM)  Throw away your first urine of the morning  Collect your urine every time you pee for the next 24 hours STOP your urine collection 24 hours after you started the collection (For example - You would stop at 9AM the day after you started)    HOW TO TREAT LOW BLOOD SUGARS (Blood sugar LESS THAN 70 MG/DL)  Please follow the RULE OF 15 for the treatment of hypoglycemia treatment (when your (blood sugars are less than 70 mg/dL)    STEP 1: Take 15 grams of carbohydrates when your blood sugar is low, which includes:   3-4 GLUCOSE TABS  OR  3-4 OZ OF JUICE OR REGULAR SODA OR  ONE TUBE OF GLUCOSE GEL     STEP 2: RECHECK blood sugar in 15 MINUTES STEP 3: If your blood sugar is still low at the 15 minute recheck --> then, go back to STEP 1 and treat AGAIN with another 15 grams of carbohydrates.

## 2021-02-17 ENCOUNTER — Other Ambulatory Visit: Payer: Self-pay | Admitting: Obstetrics and Gynecology

## 2021-02-17 NOTE — Patient Outreach (Signed)
Care Coordination  02/17/2021  SHAWNIE NICOLE 1976/01/23 [id number]    Medicaid Managed Care   Unsuccessful Outreach Note  02/17/2021 Name: JOSCELYNN BRUTUS MRN: [id number] DOB: 02-09-1976  Referred by: Barbette Merino, NP Reason for referral : High Risk Managed Medicaid (Unsuccessful telephone outreach/)   Third unsuccessful telephone outreach was attempted today. The patient was referred to the case management team for assistance with care management and care coordination. The patient's primary care provider has been notified of our unsuccessful attempts to make or maintain contact with the patient. The care management team is pleased to engage with this patient at any time in the future should he/she be interested in assistance from the care management team.   Follow Up Plan: We have been unable to make contact with the patient for follow up. The care management team is available to follow up with the patient after provider conversation with the patient regarding recommendation for care management engagement and subsequent re-referral to the care management team.   Kathi Der RN, BSN [place]  [facility] Care Management Coordinator - Managed [facility] High Risk [phone].

## 2021-02-17 NOTE — Patient Instructions (Signed)
   [name]  - as a part of your Medicaid benefit, you are eligible for care management and care coordination services at no cost or copay. I was unable to reach you by phone today but would be happy to help you with your health related needs. Please feel free to call me at [phone]. Marland Kitchen  Kathi Der RN, BSN [place]  [facility] - Managed Medicaid High Risk [phone].

## 2021-02-18 ENCOUNTER — Ambulatory Visit: Payer: Medicaid Other | Admitting: Internal Medicine

## 2021-02-21 ENCOUNTER — Inpatient Hospital Stay: Payer: Medicaid Other

## 2021-02-21 ENCOUNTER — Ambulatory Visit: Payer: Medicaid Other | Admitting: Physical Medicine and Rehabilitation

## 2021-02-21 ENCOUNTER — Other Ambulatory Visit: Payer: Medicaid Other

## 2021-02-21 ENCOUNTER — Telehealth: Payer: Self-pay | Admitting: Internal Medicine

## 2021-02-21 DIAGNOSIS — R002 Palpitations: Secondary | ICD-10-CM

## 2021-02-21 MED ORDER — EMPAGLIFLOZIN 10 MG PO TABS: 10.0000 mg | ORAL_TABLET | Freq: Every day | ORAL | 6 refills | Status: DC

## 2021-02-21 MED ORDER — CIPROFLOXACIN HCL 500 MG PO TABS: 500.0000 mg | ORAL_TABLET | Freq: Two times a day (BID) | ORAL | 0 refills | Status: AC

## 2021-02-21 NOTE — Telephone Encounter (Signed)
Forwarding to you :)

## 2021-02-21 NOTE — Telephone Encounter (Signed)
PA received per Johny Drilling

## 2021-02-21 NOTE — Telephone Encounter (Signed)
Pt called because the Dr sent in her prescriptions last week (humalog, toujeo and [place]) but she is unable to get them because she says the pharmacy told her they need authorization.   Pt would also like to see if Dr could send her in some medication for her UTI? If so she would like it sent to   [id number] - [place], [place] - [address] AT [facility] OF [address] Phone:  [phone]  Fax:  [phone]

## 2021-02-22 ENCOUNTER — Other Ambulatory Visit: Payer: 59

## 2021-02-22 ENCOUNTER — Other Ambulatory Visit: Payer: Self-pay

## 2021-02-22 DIAGNOSIS — Z794 Long term (current) use of insulin: Secondary | ICD-10-CM

## 2021-02-22 DIAGNOSIS — E119 Type 2 diabetes mellitus without complications: Secondary | ICD-10-CM

## 2021-02-22 NOTE — Progress Notes (Signed)
Subjective:    Patient ID: Jill Shaw, female    DOB: 1976-05-11, 45 y.o.   MRN: [id number]  HPI: Jill Shaw is a 45 y.o. female who returns for follow up appointment for chronic pain and medication refill. She states her pain is located in her lower back radiating into her bilateral lower extremities and bilateral knee pain. She denies falling, we will order X-rays, she verbalizes understanding. She rates her pain 9. Her current exercise regime is walking.   Ms. Jester Morphine equivalent is 20.00 MME.  Last Oral Swab was Performed on 01/31/2021, it was consistent.    Pain Inventory Average Pain 9 Pain Right Now 9 My pain is sharp, burning, tingling and aching  In the last 24 hours, has pain interfered with the following? General activity 0 Relation with others 0 Enjoyment of life 5 What TIME of day is your pain at its worst? morning , daytime, evening and night Sleep (in general) Poor  Pain is worse with: walking, bending, sitting, inactivity, standing and some activites Pain improves with: heat/ice and medication Relief from Meds: 10  Family History  Problem Relation Age of Onset  . Diabetes Mother   . Hypertension Mother    Social History   Socioeconomic History  . Marital status: Legally Separated    Spouse name: Not on file  . Number of children: 3  . Years of education: Not on file  . Highest education level: Not on file  Occupational History  . Occupation: unemployed  Tobacco Use  . Smoking status: Former Smoker    Packs/day: 0.25    Years: 26.00    Pack years: 6.50    Types: Cigarettes    Quit date: 09/13/2020    Years since quitting: 0.4  . Smokeless tobacco: Never Used  . Tobacco comment: 4-5 cigarettes/day  Vaping Use  . Vaping Use: Never used  Substance and Sexual Activity  . Alcohol use: No  . Drug use: No  . Sexual activity: Not Currently    Birth control/protection: None  Other Topics Concern  . Not on file  Social History  Narrative   Right Handed   Lives in a one story apartment, but lives on the second floor   Drinks caffeine once in awhile   Social Determinants of Health   Financial Resource Strain: Not on file  Food Insecurity: No Food Insecurity  . Worried About Programme researcher, broadcasting/film/video in the Last Year: Never true  . Ran Out of Food in the Last Year: Never true  Transportation Needs: No Transportation Needs  . Lack of Transportation (Medical): No  . Lack of Transportation (Non-Medical): No  Physical Activity: Not on file  Stress: Not on file  Social Connections: Not on file   Past Surgical History:  Procedure Laterality Date  . CESAREAN SECTION     x 1. for twins  . DILATION AND CURETTAGE OF UTERUS N/A 08/20/2019   Procedure: DILATATION AND CURETTAGE;  Surgeon: Allie Bossier, MD;  Location: [facility];  Service: Gynecology;  Laterality: N/A;  . ENDOMETRIAL ABLATION N/A 08/20/2019   Procedure: Minerva Ablation;  Surgeon: Allie Bossier, MD;  Location: [facility];  Service: Gynecology;  Laterality: N/A;  . EYE SURGERY Bilateral    laser right and cataract removed left eye  . RADIOLOGY WITH ANESTHESIA N/A 09/16/2019   Procedure: MRI WITH ANESTHESIA   L SPINE WITHOUT CONTRAST, T SPINE WITHOUT CONTRAST , CERVICAL WITHOUT CONTRAST;  Surgeon: Radiologist, Medication, MD;  Location: [facility];  Service: Radiology;  Laterality: N/A;  . TUBAL LIGATION     interval BTL  . UPPER GI ENDOSCOPY  07/2017   Past Surgical History:  Procedure Laterality Date  . CESAREAN SECTION     x 1. for twins  . DILATION AND CURETTAGE OF UTERUS N/A 08/20/2019   Procedure: DILATATION AND CURETTAGE;  Surgeon: Allie Bossier, MD;  [facility]: [facility];  Service: Gynecology;  Laterality: N/A;  . ENDOMETRIAL ABLATION N/A 08/20/2019   Procedure: Minerva Ablation;  Surgeon: Allie Bossier, MD;  [facility]: [facility];  Service: Gynecology;  Laterality: N/A;  . EYE SURGERY Bilateral    laser right and cataract removed left eye  . RADIOLOGY WITH ANESTHESIA N/A  09/16/2019   Procedure: MRI WITH ANESTHESIA   L SPINE WITHOUT CONTRAST, T SPINE WITHOUT CONTRAST , CERVICAL WITHOUT CONTRAST;  Surgeon: Radiologist, Medication, MD;  Location: [facility];  Service: Radiology;  Laterality: N/A;  . TUBAL LIGATION     interval BTL  . UPPER GI ENDOSCOPY  07/2017   Past Medical History:  Diagnosis Date  . Anemia   . Arthritis    knees, hands  . Asthma   . Chronic diastolic (congestive) heart failure (HCC)   . COPD (chronic obstructive pulmonary disease) (HCC)   . Diabetes mellitus without complication (HCC)    type 2  . Dysfunctional uterine bleeding   . GERD (gastroesophageal reflux disease)   . Hypertension   . Neuromuscular disorder (HCC)    neuropathy feet  . Seizures (HCC) 09/12/2017   pt states r/t stress and blood sugar - no meds last one 4 months ago, not seen neurologist  . Sickle cell trait (HCC)   . Smoker   . Vitamin D deficiency 10/2019  . Wears glasses    LMP 01/28/2021   Opioid Risk Score:   Fall Risk Score:  `1  Depression screen PHQ 2/9  Depression screen [facility] 2/9 01/31/2021 01/20/2021 01/07/2021 10/27/2020 10/13/2020 09/02/2020 08/11/2020  Decreased Interest 1 0 0 0 1 2 2   Down, Depressed, Hopeless 1 0 0 0 0 2 2  PHQ - 2 Score 2 0 0 0 1 4 4   Altered sleeping - 3 - 3 3 2 2   Tired, decreased energy - 3 - 2 3 2  0  Change in appetite - 0 - 0 0 0 0  Feeling bad or failure about yourself  - 0 - 0 0 0 0  Trouble concentrating - 1 - 0 2 0 2  Moving slowly or fidgety/restless - 0 - 0 0 0 2  Suicidal thoughts - 0 - 0 0 0 0  PHQ-9 Score - 7 - 5 9 8 10   Difficult doing work/chores - - - - Very difficult Somewhat difficult -  Some recent data might be hidden     Review of Systems  Constitutional: Positive for diaphoresis and unexpected weight change.  Respiratory: Positive for shortness of breath and wheezing.   Gastrointestinal: Positive for abdominal pain, constipation and nausea.  Musculoskeletal: Positive for arthralgias, back pain,  gait problem and myalgias.  Neurological: Positive for dizziness, weakness and numbness.  Hematological: Bruises/bleeds easily.  Psychiatric/Behavioral: Positive for confusion and decreased concentration. The patient is nervous/anxious.   All other systems reviewed and are negative.      Objective:   Physical Exam Vitals and nursing note reviewed.  Constitutional:      Appearance: Normal appearance.  Cardiovascular:     Rate and Rhythm: Normal rate and  regular rhythm.     Pulses: Normal pulses.     Heart sounds: Normal heart sounds.  Pulmonary:     Effort: Pulmonary effort is normal.     Breath sounds: Normal breath sounds.  Musculoskeletal:     Cervical back: Normal range of motion and neck supple.     Comments: Normal Muscle Bulk and Muscle Testing Reveals:  Upper Extremities: Full ROM and Muscle Strength 5/5  Lumbar Paraspinal Tenderness: L-3-L-5 Lower Extremities: Decreased ROM and Muscle Strength 5/5 Bilateral Lower Extremities Flexion Produces Pain into her Bilateral Patella's Narrow Based Gait   Skin:    General: Skin is warm and dry.  Neurological:     Mental Status: She is alert and oriented to person, place, and time.  Psychiatric:        Mood and Affect: Mood normal.        Behavior: Behavior normal.           Assessment & Plan:  1. Lumbar Radiculitis: Continue current medication regimen. Continue HEP as Tolerated.  2. Fibromyalgia: Continue HEP as Tolerated. Continue current Medication regimen. Continue to Monitor.  3. Bilateral Knee Pain:RX: Bilateral Knee X-rays. Continue to Monitor.  4. Chronic Pain Syndrome: Refilled: Hydrocodone 10/325mg  one tablet twice a day as needed for pain #60. We will continue the opioid monitoring program, this consists of regular clinic visits, examinations, urine drug screen, pill counts as well as use of [place] Controlled Substance Reporting system. A 12 month History has been reviewed on the [place] Controlled  Substance Reporting System on 02/23/2021.  F/U in 1 month

## 2021-02-22 NOTE — Telephone Encounter (Signed)
PA have been submitted

## 2021-02-23 ENCOUNTER — Other Ambulatory Visit: Payer: Self-pay

## 2021-02-23 ENCOUNTER — Encounter: Payer: Medicaid Other | Attending: Physical Medicine and Rehabilitation | Admitting: Registered Nurse

## 2021-02-23 VITALS — BP 146/84 | HR 97 | Temp 99.2°F | Ht 64.0 in | Wt 267.0 lb

## 2021-02-23 DIAGNOSIS — G894 Chronic pain syndrome: Secondary | ICD-10-CM | POA: Diagnosis not present

## 2021-02-23 DIAGNOSIS — Z5181 Encounter for therapeutic drug level monitoring: Secondary | ICD-10-CM | POA: Insufficient documentation

## 2021-02-23 DIAGNOSIS — M797 Fibromyalgia: Secondary | ICD-10-CM | POA: Diagnosis not present

## 2021-02-23 DIAGNOSIS — Z79899 Other long term (current) drug therapy: Secondary | ICD-10-CM

## 2021-02-23 DIAGNOSIS — M25562 Pain in left knee: Secondary | ICD-10-CM

## 2021-02-23 DIAGNOSIS — M5416 Radiculopathy, lumbar region: Secondary | ICD-10-CM | POA: Insufficient documentation

## 2021-02-23 DIAGNOSIS — M25561 Pain in right knee: Secondary | ICD-10-CM | POA: Insufficient documentation

## 2021-02-23 MED ORDER — HYDROCODONE-ACETAMINOPHEN 10-325 MG PO TABS: 1.0000 | ORAL_TABLET | Freq: Two times a day (BID) | ORAL | 0 refills | Status: DC | PRN

## 2021-02-24 ENCOUNTER — Other Ambulatory Visit: Payer: Self-pay | Admitting: Physical Medicine and Rehabilitation

## 2021-02-24 ENCOUNTER — Encounter: Payer: Self-pay | Admitting: Internal Medicine

## 2021-02-24 MED ORDER — SAVELLA 12.5 MG PO TABS: 12.5000 mg | ORAL_TABLET | Freq: Every day | ORAL | 4 refills | Status: DC

## 2021-02-25 ENCOUNTER — Telehealth: Payer: Self-pay | Admitting: *Deleted

## 2021-02-25 NOTE — Telephone Encounter (Signed)
Prior auth for Savella 12.5 mg one tablet q hs #30 via CoverMyMeds to [facility] Medicaid/[employer].

## 2021-02-28 LAB — CORTISOL, URINE, 24 HOUR
24 Hour urine volume (VMAHVA): 1700 mL
CREATININE, URINE: 1.57 g/(24.h) (ref 0.50–2.15)
Cortisol (Ur), Free: 10.4 mcg/24 h (ref 4.0–50.0)

## 2021-03-02 ENCOUNTER — Encounter: Payer: Self-pay | Admitting: Internal Medicine

## 2021-03-02 ENCOUNTER — Other Ambulatory Visit: Payer: Medicaid Other

## 2021-03-02 ENCOUNTER — Inpatient Hospital Stay: Payer: Medicaid Other

## 2021-03-02 MED ORDER — TRESIBA FLEXTOUCH 200 UNIT/ML ~~LOC~~ SOPN: 110.0000 [IU] | PEN_INJECTOR | Freq: Every day | SUBCUTANEOUS | 4 refills | Status: DC

## 2021-03-02 MED ORDER — INSULIN LISPRO (1 UNIT DIAL) 100 UNIT/ML (KWIKPEN): 40.0000 [IU] | PEN_INJECTOR | Freq: Three times a day (TID) | SUBCUTANEOUS | 3 refills | Status: DC

## 2021-03-02 NOTE — Telephone Encounter (Signed)
I have called [facility] since medicaid did not send a responded back.  So Toujeo 300 units/mL and Humalog 200 units/mL were denied. (We had another patient before that medicaid also denied but approved after the appeal letter sent)  London Pepper was approved.

## 2021-03-02 NOTE — Telephone Encounter (Signed)
Amada Jupiter was denied by her insurance.  I have submitted a letter of appeal on behalf of Ms Leano To [employer].

## 2021-03-03 ENCOUNTER — Ambulatory Visit: Payer: Self-pay | Admitting: Nurse Practitioner

## 2021-03-03 NOTE — Telephone Encounter (Signed)
Vernona Rieger from [facility] called and Amada Jupiter was approved 03/03/21-03/03/22. Ms Spaid notified.

## 2021-03-04 ENCOUNTER — Encounter: Payer: Self-pay | Admitting: Internal Medicine

## 2021-03-06 ENCOUNTER — Encounter: Payer: Self-pay | Admitting: Registered Nurse

## 2021-03-07 ENCOUNTER — Ambulatory Visit: Payer: Medicaid Other | Admitting: Podiatry

## 2021-03-07 NOTE — Telephone Encounter (Signed)
Prior Auth have been submitted

## 2021-03-08 ENCOUNTER — Encounter: Payer: Self-pay | Admitting: Internal Medicine

## 2021-03-08 ENCOUNTER — Encounter
Payer: Medicaid Other | Attending: Physical Medicine and Rehabilitation | Admitting: Physical Medicine and Rehabilitation

## 2021-03-08 ENCOUNTER — Other Ambulatory Visit: Payer: Self-pay

## 2021-03-08 DIAGNOSIS — G4701 Insomnia due to medical condition: Secondary | ICD-10-CM | POA: Diagnosis not present

## 2021-03-08 MED ORDER — SAVELLA 25 MG PO TABS: 1.0000 | ORAL_TABLET | Freq: Every day | ORAL | 3 refills | Status: DC

## 2021-03-08 NOTE — Progress Notes (Signed)
Subjective:    Patient ID: Jill Shaw, female    DOB: [birth year], 45 y.o.   MRN: [id number]  Due to national recommendations of social distancing because of COVID 40, an audio/video tele-health visit is felt to be the most appropriate encounter for this patient at this time. See MyChart message from today for the patient's consent to a tele-health encounter with [facility]. This is a follow up tele-visit via phone. The patient is at home. MD is at office.   HPI: Jill Shaw is a 45 y.o. female who returns for f/u appointment for chronic pain and insomnia. She states her pain is located in her lower back radiating into her bilateral lower extremities and bilateral knee pain. She denies falling, we will order X-rays, she verbalizes understanding. She rates her pain 9. Her current exercise regime is walking.   Jill Shaw Morphine equivalent is 20.00 MME.  Last Oral Swab was Performed on 01/31/2021, it was consistent.   She continues to experience insomnia at night. Asks about Ambien and I discussed that this is an addictive medication and there are other safer options we can try first that can also help with her pain. We tried Savella 12.5mg  which did not help and did not cause any side effects.    Pain Inventory Average Pain 9 Pain Right Now 9 My pain is sharp, burning, tingling and aching  In the last 24 hours, has pain interfered with the following? General activity 0 Relation with others 0 Enjoyment of life 5 What TIME of day is your pain at its worst? morning , daytime, evening and night Sleep (in general) Poor  Pain is worse with: walking, bending, sitting, inactivity, standing and some activites Pain improves with: heat/ice and medication Relief from Meds: 10  Family History  Problem Relation Age of Onset  . Diabetes Mother   . Hypertension Mother    Social History   Socioeconomic History  . Marital status: Legally Separated     Spouse name: Not on file  . Number of children: 3  . Years of education: Not on file  . Highest education level: Not on file  Occupational History  . Occupation: unemployed  Tobacco Use  . Smoking status: Former Smoker    Packs/day: 0.25    Years: 26.00    Pack years: 6.50    Types: Cigarettes    Quit date: 09/13/2020    Years since quitting: 0.4  . Smokeless tobacco: Never Used  . Tobacco comment: 4-5 cigarettes/day  Vaping Use  . Vaping Use: Never used  Substance and Sexual Activity  . Alcohol use: No  . Drug use: No  . Sexual activity: Not Currently    Birth control/protection: None  Other Topics Concern  . Not on file  Social History Narrative   Right Handed   Lives in a one story apartment, but lives on the second floor   Drinks caffeine once in awhile   Social Determinants of Health   Financial Resource Strain: Not on file  Food Insecurity: No Food Insecurity  . Worried About Programme researcher, broadcasting/film/video in the Last Year: Never true  . Ran Out of Food in the Last Year: Never true  Transportation Needs: No Transportation Needs  . Lack of Transportation (Medical): No  . Lack of Transportation (Non-Medical): No  Physical Activity: Not on file  Stress: Not on file  Social Connections: Not on file   Past Surgical History:  Procedure  Laterality Date  . CESAREAN SECTION     x 1. for twins  . DILATION AND CURETTAGE OF UTERUS N/A 08/20/2019   Procedure: DILATATION AND CURETTAGE;  Surgeon: Allie Bossier, MD;  Location: [facility];  Service: Gynecology;  Laterality: N/A;  . ENDOMETRIAL ABLATION N/A 08/20/2019   Procedure: Minerva Ablation;  Surgeon: Allie Bossier, MD;  Location: [facility];  Service: Gynecology;  Laterality: N/A;  . EYE SURGERY Bilateral    laser right and cataract removed left eye  . RADIOLOGY WITH ANESTHESIA N/A 09/16/2019   Procedure: MRI WITH ANESTHESIA   L SPINE WITHOUT CONTRAST, T SPINE WITHOUT CONTRAST , CERVICAL WITHOUT CONTRAST;  Surgeon: Radiologist, Medication,  MD;  Location: [facility];  Service: Radiology;  Laterality: N/A;  . TUBAL LIGATION     interval BTL  . UPPER GI ENDOSCOPY  07/2017   Past Surgical History:  Procedure Laterality Date  . CESAREAN SECTION     x 1. for twins  . DILATION AND CURETTAGE OF UTERUS N/A 08/20/2019   Procedure: DILATATION AND CURETTAGE;  Surgeon: Allie Bossier, MD;  [facility]: [facility];  Service: Gynecology;  Laterality: N/A;  . ENDOMETRIAL ABLATION N/A 08/20/2019   Procedure: Minerva Ablation;  Surgeon: Allie Bossier, MD;  Location: [facility];  Service: Gynecology;  Laterality: N/A;  . EYE SURGERY Bilateral    laser right and cataract removed left eye  . RADIOLOGY WITH ANESTHESIA N/A 09/16/2019   Procedure: MRI WITH ANESTHESIA   L SPINE WITHOUT CONTRAST, T SPINE WITHOUT CONTRAST , CERVICAL WITHOUT CONTRAST;  Surgeon: Radiologist, Medication, MD;  Location: [facility];  Service: Radiology;  Laterality: N/A;  . TUBAL LIGATION     interval BTL  . UPPER GI ENDOSCOPY  07/2017   Past Medical History:  Diagnosis Date  . Anemia   . Arthritis    knees, hands  . Asthma   . Chronic diastolic (congestive) heart failure (HCC)   . COPD (chronic obstructive pulmonary disease) (HCC)   . Diabetes mellitus without complication (HCC)    type 2  . Dysfunctional uterine bleeding   . GERD (gastroesophageal reflux disease)   . Hypertension   . Neuromuscular disorder (HCC)    neuropathy feet  . Seizures (HCC) 09/12/2017   pt states r/t stress and blood sugar - no meds last one 4 months ago, not seen neurologist  . Sickle cell trait (HCC)   . Smoker   . Vitamin D deficiency 10/2019  . Wears glasses    There were no vitals taken for this visit.  Opioid Risk Score:   Fall Risk Score:  `1  Depression screen PHQ 2/9  Depression screen [facility] 2/9 01/31/2021 01/20/2021 01/07/2021 10/27/2020 10/13/2020 09/02/2020 08/11/2020  Decreased Interest 1 0 0 0 1 2 2   Down, Depressed, Hopeless 1 0 0 0 0 2 2  PHQ - 2 Score 2 0 0 0 1 4 4   Altered sleeping - 3 -  3 3 2 2   Tired, decreased energy - 3 - 2 3 2  0  Change in appetite - 0 - 0 0 0 0  Feeling bad or failure about yourself  - 0 - 0 0 0 0  Trouble concentrating - 1 - 0 2 0 2  Moving slowly or fidgety/restless - 0 - 0 0 0 2  Suicidal thoughts - 0 - 0 0 0 0  PHQ-9 Score - 7 - 5 9 8 10   Difficult doing work/chores - - - - Very difficult Somewhat  difficult -  Some recent data might be hidden     Review of Systems  Constitutional: Positive for diaphoresis and unexpected weight change.  Respiratory: Positive for shortness of breath and wheezing.   Gastrointestinal: Positive for abdominal pain, constipation and nausea.  Musculoskeletal: Positive for arthralgias, back pain, gait problem and myalgias.  Neurological: Positive for dizziness, weakness and numbness.  Hematological: Bruises/bleeds easily.  Psychiatric/Behavioral: Positive for confusion and decreased concentration. The patient is nervous/anxious.   All other systems reviewed and are negative.      Objective:  Not performed as patient was seen via phone visit.         Assessment & Plan:  1. Lumbar Radiculitis: Continue current medication regimen. Continue HEP as Tolerated.  2. Fibromyalgia: Continue HEP as Tolerated. Continue current Medication regimen. Continue to Monitor.  3. Bilateral Knee Pain:RX: Bilateral Knee X-rays. Continue to Monitor.  4. Chronic Pain Syndrome: Continue Hydrocodone 10/325mg  one tablet twice a day as needed for pain #60. We will continue the opioid monitoring program, this consists of regular clinic visits, examinations, urine drug screen, pill counts as well as use of [place] Controlled Substance Reporting system. A 12 month History has been reviewed on the [place] Controlled Substance Reporting System on 02/23/2021. 5. Insomnia: increase Savella to 25mg  at night. Can used current 12.5mg  tablets and double dose and I will send higher dose of 25mg . Advised that this medication can help with  both sleep and pain.   F/U with within 1 month   10 minutes spent in discussion of patient's insomnia, response to [facility], recommendation to increase dose to 25mg - she can complete the current medication she has by doubling up 12.5mg  dose and I have sent higher dose of 25mg  for when she runs out.

## 2021-03-09 ENCOUNTER — Inpatient Hospital Stay: Payer: Medicaid Other

## 2021-03-09 ENCOUNTER — Other Ambulatory Visit: Payer: Medicaid Other

## 2021-03-10 ENCOUNTER — Encounter: Payer: Self-pay | Admitting: Internal Medicine

## 2021-03-10 NOTE — Telephone Encounter (Signed)
[facility] called and advised that PA has been denied

## 2021-03-11 ENCOUNTER — Other Ambulatory Visit: Payer: Self-pay | Admitting: Internal Medicine

## 2021-03-11 MED ORDER — LANTUS SOLOSTAR 100 UNIT/ML ~~LOC~~ SOPN: 110.0000 [IU] | PEN_INJECTOR | Freq: Every day | SUBCUTANEOUS | 6 refills | Status: DC

## 2021-03-11 NOTE — Telephone Encounter (Signed)
Message left for patient to return my call.  

## 2021-03-11 NOTE — Telephone Encounter (Signed)
A prescription for Lantus has been sent, apparently her insurance does not cover Toujeo nor [place].  She will need to try the Lantus and maybe after she tries it we can do another prior authorization.  The other option if she lives close by she can stop by the office and take some Toujeo samples from our office

## 2021-03-11 NOTE — Telephone Encounter (Signed)
Please advise. The denied letter stated that patient  have tried or cannot use one preferred drug: Lantus Solostar, Lantus vial, Levemir Flextouch, Levemir vial.  Or do a appeal? It will take a while for the process

## 2021-03-14 ENCOUNTER — Observation Stay (HOSPITAL_COMMUNITY)
Admission: EM | Admit: 2021-03-14 | Discharge: 2021-03-16 | Disposition: A | Discharge status: Home / Self Care | Payer: Medicaid Other | Attending: Internal Medicine | Admitting: Internal Medicine

## 2021-03-14 ENCOUNTER — Emergency Department (HOSPITAL_COMMUNITY): Payer: Medicaid Other

## 2021-03-14 DIAGNOSIS — Z79899 Other long term (current) drug therapy: Secondary | ICD-10-CM | POA: Diagnosis not present

## 2021-03-14 DIAGNOSIS — E1122 Type 2 diabetes mellitus with diabetic chronic kidney disease: Secondary | ICD-10-CM | POA: Diagnosis not present

## 2021-03-14 DIAGNOSIS — N182 Chronic kidney disease, stage 2 (mild): Secondary | ICD-10-CM | POA: Diagnosis not present

## 2021-03-14 DIAGNOSIS — T50904A Poisoning by unspecified drugs, medicaments and biological substances, undetermined, initial encounter: Secondary | ICD-10-CM | POA: Diagnosis present

## 2021-03-14 DIAGNOSIS — T43225A Adverse effect of selective serotonin reuptake inhibitors, initial encounter: Secondary | ICD-10-CM | POA: Insufficient documentation

## 2021-03-14 DIAGNOSIS — Z794 Long term (current) use of insulin: Secondary | ICD-10-CM | POA: Diagnosis not present

## 2021-03-14 DIAGNOSIS — E872 Acidosis: Secondary | ICD-10-CM | POA: Diagnosis not present

## 2021-03-14 DIAGNOSIS — I5033 Acute on chronic diastolic (congestive) heart failure: Secondary | ICD-10-CM | POA: Diagnosis not present

## 2021-03-14 DIAGNOSIS — J449 Chronic obstructive pulmonary disease, unspecified: Secondary | ICD-10-CM | POA: Insufficient documentation

## 2021-03-14 DIAGNOSIS — R29810 Facial weakness: Secondary | ICD-10-CM | POA: Diagnosis not present

## 2021-03-14 DIAGNOSIS — Z20822 Contact with and (suspected) exposure to covid-19: Secondary | ICD-10-CM | POA: Insufficient documentation

## 2021-03-14 DIAGNOSIS — Z87891 Personal history of nicotine dependence: Secondary | ICD-10-CM | POA: Insufficient documentation

## 2021-03-14 DIAGNOSIS — I129 Hypertensive chronic kidney disease with stage 1 through stage 4 chronic kidney disease, or unspecified chronic kidney disease: Secondary | ICD-10-CM | POA: Insufficient documentation

## 2021-03-14 DIAGNOSIS — J9811 Atelectasis: Secondary | ICD-10-CM | POA: Diagnosis not present

## 2021-03-14 DIAGNOSIS — G2579 Other drug induced movement disorders: Secondary | ICD-10-CM

## 2021-03-14 DIAGNOSIS — G9081 Serotonin syndrome: Secondary | ICD-10-CM | POA: Diagnosis present

## 2021-03-14 DIAGNOSIS — Y900 Blood alcohol level of less than 20 mg/100 ml: Secondary | ICD-10-CM | POA: Diagnosis not present

## 2021-03-14 DIAGNOSIS — J45909 Unspecified asthma, uncomplicated: Secondary | ICD-10-CM | POA: Insufficient documentation

## 2021-03-14 DIAGNOSIS — R000 Tachycardia, unspecified: Secondary | ICD-10-CM | POA: Diagnosis not present

## 2021-03-14 DIAGNOSIS — R4020 Unspecified coma: Secondary | ICD-10-CM | POA: Diagnosis not present

## 2021-03-14 DIAGNOSIS — R0902 Hypoxemia: Secondary | ICD-10-CM | POA: Diagnosis not present

## 2021-03-14 DIAGNOSIS — R404 Transient alteration of awareness: Secondary | ICD-10-CM | POA: Diagnosis not present

## 2021-03-14 DIAGNOSIS — R464 Slowness and poor responsiveness: Principal | ICD-10-CM | POA: Insufficient documentation

## 2021-03-14 LAB — CBC WITH DIFFERENTIAL/PLATELET
Abs Immature Granulocytes: 0.04 10*3/uL (ref 0.00–0.07)
Basophils Absolute: 0.1 10*3/uL (ref 0.0–0.1)
Basophils Relative: 1 %
Eosinophils Absolute: 0.3 10*3/uL (ref 0.0–0.5)
Eosinophils Relative: 3 %
HCT: 35.6 % — ABNORMAL LOW (ref 36.0–46.0)
Hemoglobin: 10.6 g/dL — ABNORMAL LOW (ref 12.0–15.0)
Immature Granulocytes: 0 %
Lymphocytes Relative: 16 %
Lymphs Abs: 1.7 10*3/uL (ref 0.7–4.0)
MCH: 20.7 pg — ABNORMAL LOW (ref 26.0–34.0)
MCHC: 29.8 g/dL — ABNORMAL LOW (ref 30.0–36.0)
MCV: 69.7 fL — ABNORMAL LOW (ref 80.0–100.0)
Monocytes Absolute: 0.7 10*3/uL (ref 0.1–1.0)
Monocytes Relative: 6 %
Neutro Abs: 7.9 10*3/uL — ABNORMAL HIGH (ref 1.7–7.7)
Neutrophils Relative %: 74 %
Platelets: 270 10*3/uL (ref 150–400)
RBC: 5.11 MIL/uL (ref 3.87–5.11)
RDW: 23.9 % — ABNORMAL HIGH (ref 11.5–15.5)
WBC: 10.7 10*3/uL — ABNORMAL HIGH (ref 4.0–10.5)
nRBC: 0 % (ref 0.0–0.2)

## 2021-03-14 LAB — COMPREHENSIVE METABOLIC PANEL
ALT: 16 U/L (ref 0–44)
AST: 37 U/L (ref 15–41)
Albumin: 3.7 g/dL (ref 3.5–5.0)
Alkaline Phosphatase: 89 U/L (ref 38–126)
Anion gap: 10 (ref 5–15)
BUN: 13 mg/dL (ref 6–20)
CO2: 22 mmol/L (ref 22–32)
Calcium: 9.3 mg/dL (ref 8.9–10.3)
Chloride: 103 mmol/L (ref 98–111)
Creatinine, Ser: 0.95 mg/dL (ref 0.44–1.00)
GFR, Estimated: >60 mL/min (ref >60)
Glucose, Bld: 111 mg/dL — ABNORMAL HIGH (ref 70–99)
Potassium: 4.2 mmol/L (ref 3.5–5.1)
Sodium: 135 mmol/L (ref 135–145)
Total Bilirubin: 0.4 mg/dL (ref 0.3–1.2)
Total Protein: 7.3 g/dL (ref 6.5–8.1)

## 2021-03-14 LAB — I-STAT VENOUS BLOOD GAS, ED
Acid-base deficit: 2 mmol/L (ref 0.0–2.0)
Bicarbonate: 21.4 mmol/L (ref 20.0–28.0)
Calcium, Ion: 1.06 mmol/L — ABNORMAL LOW (ref 1.15–1.40)
HCT: 33 % — ABNORMAL LOW (ref 36.0–46.0)
Hemoglobin: 11.2 g/dL — ABNORMAL LOW (ref 12.0–15.0)
O2 Saturation: 97 %
Potassium: 3.2 mmol/L — ABNORMAL LOW (ref 3.5–5.1)
Sodium: 142 mmol/L (ref 135–145)
TCO2: 22 mmol/L (ref 22–32)
pCO2, Ven: 29.9 mmHg — ABNORMAL LOW (ref 44.0–60.0)
pH, Ven: 7.462 — ABNORMAL HIGH (ref 7.250–7.430)
pO2, Ven: 86 mmHg — ABNORMAL HIGH (ref 32.0–45.0)

## 2021-03-14 LAB — SALICYLATE LEVEL: Salicylate Lvl: <7 mg/dL — ABNORMAL LOW (ref 7.0–30.0)

## 2021-03-14 LAB — ETHANOL: Alcohol, Ethyl (B): <10 mg/dL (ref <10)

## 2021-03-14 LAB — I-STAT BETA HCG BLOOD, ED (MC, WL, AP ONLY): I-stat hCG, quantitative: <5 m[IU]/mL (ref <5)

## 2021-03-14 LAB — CBG MONITORING, ED
Glucose-Capillary: 123 mg/dL — ABNORMAL HIGH (ref 70–99)
Glucose-Capillary: 138 mg/dL — ABNORMAL HIGH (ref 70–99)

## 2021-03-14 LAB — MAGNESIUM: Magnesium: 1.9 mg/dL (ref 1.7–2.4)

## 2021-03-14 LAB — ACETAMINOPHEN LEVEL: Acetaminophen (Tylenol), Serum: <10 ug/mL — ABNORMAL LOW (ref 10–30)

## 2021-03-14 MED ORDER — LORAZEPAM 2 MG/ML IJ SOLN
2.0000 mg | INTRAMUSCULAR | Status: DC | PRN
  Administered 2021-03-14 – 2021-03-15 (×2): 2 mg via INTRAVENOUS
  Filled 2021-03-14 (×2): qty 1

## 2021-03-14 MED ORDER — SODIUM CHLORIDE 0.9 % IV BOLUS (SEPSIS)
500.0000 mL | Freq: Once | INTRAVENOUS | Status: AC
  Administered 2021-03-14: 500 mL via INTRAVENOUS

## 2021-03-14 MED ORDER — LACTATED RINGERS IV SOLN
INTRAVENOUS | Status: DC
  Administered 2021-03-15: 125 mL/h via INTRAVENOUS

## 2021-03-14 MED ORDER — INSULIN GLARGINE 100 UNIT/ML ~~LOC~~ SOLN
55.0000 [IU] | Freq: Every day | SUBCUTANEOUS | Status: DC
  Administered 2021-03-15 – 2021-03-16 (×2): 55 [IU] via SUBCUTANEOUS
  Filled 2021-03-14 (×2): qty 0.55

## 2021-03-14 MED ORDER — INSULIN ASPART 100 UNIT/ML IJ SOLN
0.0000 [IU] | INTRAMUSCULAR | Status: DC
  Administered 2021-03-14: 2 [IU] via SUBCUTANEOUS
  Administered 2021-03-15 (×2): 8 [IU] via SUBCUTANEOUS
  Administered 2021-03-15 (×2): 15 [IU] via SUBCUTANEOUS
  Administered 2021-03-15: 8 [IU] via SUBCUTANEOUS
  Administered 2021-03-16 (×3): 3 [IU] via SUBCUTANEOUS

## 2021-03-14 MED ORDER — SODIUM CHLORIDE 0.9 % IV SOLN
1000.0000 mL | INTRAVENOUS | Status: DC
  Administered 2021-03-14: 1000 mL via INTRAVENOUS

## 2021-03-14 MED ORDER — ENOXAPARIN SODIUM 40 MG/0.4ML IJ SOSY
40.0000 mg | PREFILLED_SYRINGE | INTRAMUSCULAR | Status: DC
  Administered 2021-03-14 – 2021-03-15 (×2): 40 mg via SUBCUTANEOUS
  Filled 2021-03-14 (×2): qty 0.4

## 2021-03-14 NOTE — ED Notes (Signed)
138 CBG

## 2021-03-14 NOTE — ED Triage Notes (Signed)
Patient brought in by [facility] for suspected overdose.  Patients mother called EMS because the patient was seen walking around noon and has been in the bed and unresponsive since.  EMS found 2 empty pill bottles of Savella with the patient.

## 2021-03-14 NOTE — Consult Note (Signed)
NEURO HOSPITALIST CONSULT NOTE   Requestig physician: Dr. Alcario Drought  Reason for Consult: AMS, elevated BP and tachycardia  History obtained from:  Chart     HPI:                                                                                                                                          Jill Shaw is an 45 y.o. female with a PMHx of arthritis, asthma, CHF, COPD, DM2, HTN, neuropathy, prior seizures, sickle cell trait and vitamin D deficiency who presents to the ED via EMS for suspected overdose. Her mother called EMS due to patient being minimally responsive in her bed since just after noon on Monday. She was last seen ambulating at about noon and was acting normally at that time. When EMS arrived to her home, they found 2 empty bottles of her newly prescribed antidepressant, Savella. Of note, the patient was admitted to the hospital on March 26 for AMS, which was felt to be related to an overdose of two of her medications, Zanaflex and amitriptyline.  On exam by Hospitalist, ocular clonus and tremor were noted. She was also tachycardic and with elevated SBP.   CBG in the ED was 110.   CT head in the ED: Normal.   Past Medical History:  Diagnosis Date  . Anemia   . Arthritis    knees, hands  . Asthma   . Chronic diastolic (congestive) heart failure ([place])   . COPD (chronic obstructive pulmonary disease) ([place])   . Diabetes mellitus without complication ([place])    type 2  . Dysfunctional uterine bleeding   . GERD (gastroesophageal reflux disease)   . Hypertension   . Neuromuscular disorder ([facility])    neuropathy feet  . Seizures ([place]) 09/12/2017   pt states r/t stress and blood sugar - no meds last one 4 months ago, not seen neurologist  . Sickle cell trait ([place])   . Smoker   . Vitamin D deficiency 10/2019  . Wears glasses     Past Surgical History:  Procedure Laterality Date  . CESAREAN SECTION     x 1. for twins  . DILATION AND CURETTAGE OF  UTERUS N/A 08/20/2019   Procedure: DILATATION AND CURETTAGE;  Surgeon: Emily Filbert, MD;  Location: [facility];  Service: Gynecology;  Laterality: N/A;  . ENDOMETRIAL ABLATION N/A 08/20/2019   Procedure: [place] Ablation;  Surgeon: Emily Filbert, MD;  Location: [facility];  Service: Gynecology;  Laterality: N/A;  . EYE SURGERY Bilateral    laser right and cataract removed left eye  . RADIOLOGY WITH ANESTHESIA N/A 09/16/2019   Procedure: MRI WITH ANESTHESIA   L SPINE WITHOUT CONTRAST, T SPINE WITHOUT CONTRAST , CERVICAL WITHOUT CONTRAST;  Surgeon: Radiologist, Medication, MD;  Location:  [facility];  Service: Radiology;  Laterality: N/A;  . TUBAL LIGATION     interval BTL  . UPPER GI ENDOSCOPY  07/2017    Family History  Problem Relation Age of Onset  . Diabetes Mother   . Hypertension Mother              Social History:  reports that she quit smoking about 5 months ago. Her smoking use included cigarettes. She has a 6.50 pack-year smoking history. She has never used smokeless tobacco. She reports that she does not drink alcohol and does not use drugs.  Allergies  Allergen Reactions  . Ketoprofen Nausea And Vomiting  . Aspirin Nausea Only  . Gabapentin Nausea And Vomiting and Other (See Comments)    upset stomach  . Ibuprofen Nausea And Vomiting  . Liraglutide Nausea And Vomiting  . Naproxen Nausea And Vomiting  . Omeprazole-Sodium Bicarbonate Nausea And Vomiting  . Sulfa Antibiotics Nausea And Vomiting  . Tramadol Nausea And Vomiting and Other (See Comments)    stomach upset    MEDICATIONS:                                                                                                                     No current facility-administered medications on file prior to encounter.   Current Outpatient Medications on File Prior to Encounter  Medication Sig Dispense Refill  . ACCU-CHEK GUIDE test strip USE AS DIRECTED UP TO FOUR TIMES DAILY 100 strip 4  . Accu-Chek Softclix Lancets lancets USE AS  DIRECTED UP TO FOUR TIMES DAILY 100 each 3  . albuterol (PROVENTIL) (2.5 MG/3ML) 0.083% nebulizer solution Take 3 mLs (2.5 mg total) by nebulization every 6 (six) hours as needed for wheezing or shortness of breath. 150 mL 6  . albuterol (VENTOLIN HFA) 108 (90 Base) MCG/ACT inhaler Inhale 2 puffs into the lungs every 6 (six) hours as needed for wheezing or shortness of breath. 8 g 6  . amitriptyline (ELAVIL) 150 MG tablet Take 150 mg by mouth at bedtime.    . blood glucose meter kit and supplies KIT 1 each by Other route See admin instructions. Dispense based on patient and insurance preference. Use up to four times daily as directed. (FOR ICD-9 250.00, 250.01). 1 each 2  . budesonide-formoterol (SYMBICORT) 160-4.5 MCG/ACT inhaler INHALE 2 PUFFS INTO THE LUNGS 2 (TWO) TIMES DAILY. (Patient taking differently: Inhale 2 puffs into the lungs 2 (two) times daily.) 10.2 g 6  . buPROPion (WELLBUTRIN SR) 150 MG 12 hr tablet Take 1 tablet (150 mg total) by mouth 2 (two) times daily. 60 tablet 11  . cetirizine (ZYRTEC) 10 MG tablet Take 10 mg by mouth daily.    . ciprofloxacin (CIPRO) 500 MG tablet Take 500 mg by mouth See admin instructions. Bid x 3 days    . diclofenac Sodium (VOLTAREN) 1 % GEL Apply 4 g topically 4 (four) times daily as needed (pain).    Marland Kitchen diltiazem (CARDIZEM CD) 180 MG 24 hr  capsule Take 180 mg by mouth daily.    . empagliflozin (JARDIANCE) 10 MG TABS tablet Take 1 tablet (10 mg total) by mouth daily before breakfast. 30 tablet 6  . FARXIGA 5 MG TABS tablet Take 5 mg by mouth daily.    . ferrous sulfate 325 (65 FE) MG tablet Take 1 tablet (325 mg total) by mouth 3 (three) times daily with meals. 270 tablet 3  . fluticasone (FLONASE) 50 MCG/ACT nasal spray Place 2 sprays into both nostrils daily. 16 g 6  . furosemide (LASIX) 40 MG tablet TAKE 1 TABLET(40 MG) BY MOUTH TWICE DAILY (Patient taking differently: Take 40 mg by mouth 2 (two) times daily.) 180 tablet 1  . Glucosamine Sulfate 1000  MG CAPS Take 1 capsule (1,000 mg total) by mouth 2 (two) times daily. (Patient taking differently: Take 1,000 mg by mouth 2 (two) times daily.) 180 capsule 3  . Heating Pads (HEATING PAD MOIST/DRY KING SZ) PADS 1 each by Does not apply route 4 (four) times daily as needed. 120 each 1  . hydrochlorothiazide (MICROZIDE) 12.5 MG capsule Take 1 capsule (12.5 mg total) by mouth daily. 30 capsule 3  . HYDROcodone-acetaminophen (NORCO) 10-325 MG tablet Take 1 tablet by mouth 2 (two) times daily as needed. 60 tablet 0  . hydrocortisone 2.5 % cream Apply topically 2 (two) times daily. (Patient taking differently: Apply 1 application topically 2 (two) times daily.) 30 g 11  . hydroquinone 4 % cream APPLY TOPICALLY TWICE DAILY (Patient taking differently: Apply 1 application topically 2 (two) times daily as needed (pain).) 28.35 g 0  . insulin glargine (LANTUS SOLOSTAR) 100 UNIT/ML Solostar Pen Inject 110 Units into the skin daily. 105 mL 6  . insulin lispro (HUMALOG KWIKPEN) 100 UNIT/ML KwikPen Inject 40 Units into the skin 3 (three) times daily. 120 mL 3  . Insulin Pen Needle 31G X 8 MM MISC 1 Device by Does not apply route in the morning, at noon, in the evening, and at bedtime. 400 each 3  . lidocaine (LIDODERM) 5 % Place 1 patch onto the skin daily. Remove & Discard patch within 12 hours or as directed by MD 30 patch 0  . losartan (COZAAR) 50 MG tablet Take 1 tablet (50 mg total) by mouth daily. TAKE 1 TABLET($RemoveBefor'50MG'ZPUDuJZaxqds$  TOTAL) BY MOUTH EVERY DAY (Patient taking differently: Take 50 mg by mouth daily.) 90 tablet 3  . melatonin 3 MG TABS tablet Take 1 tablet (3 mg total) by mouth at bedtime as needed.  0  . methocarbamol (ROBAXIN) 500 MG tablet Take 500-1,000 mg by mouth every 6 (six) hours as needed for muscle spasms.    . metoprolol succinate (TOPROL-XL) 100 MG 24 hr tablet Take 100 mg by mouth daily. Take with or immediately following a meal.    . metoprolol succinate (TOPROL-XL) 200 MG 24 hr tablet Take 1 tablet  (200 mg total) by mouth daily. Take with or immediately following a meal. (Patient taking differently: No sig reported) 90 tablet 2  . Milnacipran HCl (SAVELLA) 25 MG TABS Take 1 tablet (25 mg total) by mouth at bedtime. 30 tablet 3  . nicotine (NICODERM CQ) 21 mg/24hr patch Place 1 patch (21 mg total) onto the skin daily. 28 patch 3  . norethindrone (AYGESTIN) 5 MG tablet Take 2 tablets (10 mg total) by mouth in the morning, at noon, in the evening, and at bedtime. With bleeding 120 tablet 0  . omeprazole (PRILOSEC) 40 MG capsule Take 40 mg by  mouth daily.    . ondansetron (ZOFRAN ODT) 4 MG disintegrating tablet Take 1 tablet (4 mg total) by mouth every 8 (eight) hours as needed for nausea or vomiting. 20 tablet 0  . pantoprazole (PROTONIX) 40 MG tablet Take 1 tablet (40 mg total) by mouth daily. 90 tablet 3  . potassium chloride SA (KLOR-CON) 20 MEQ tablet Take 20 mEq by mouth daily.    Marland Kitchen rOPINIRole (REQUIP) 0.25 MG tablet Take 1 tablet (0.25 mg total) by mouth 3 (three) times daily. 90 tablet 2  . rosuvastatin (CRESTOR) 5 MG tablet Take 1 tablet (5 mg total) by mouth daily. 90 tablet 3  . saxagliptin HCl (ONGLYZA) 5 MG TABS tablet Take 1 tablet (5 mg total) by mouth daily. 90 tablet 3  . spironolactone (ALDACTONE) 50 MG tablet TAKE 1 TABLET(50 MG) BY MOUTH DAILY (Patient taking differently: Take 50 mg by mouth daily.) 30 tablet 3  . Tiotropium Bromide Monohydrate (SPIRIVA RESPIMAT) 2.5 MCG/ACT AERS Inhale 2 puffs into the lungs daily. 4 g 6  . tiZANidine (ZANAFLEX) 4 MG capsule Take 4 mg by mouth 3 (three) times daily.    Marland Kitchen topiramate (TOPAMAX) 25 MG tablet Take 1 tablet (25 mg total) by mouth 2 (two) times daily. 30 tablet 1  . traZODone (DESYREL) 100 MG tablet Take 100 mg by mouth at bedtime.    . Vitamin D, Ergocalciferol, (DRISDOL) 1.25 MG (50000 UNIT) CAPS capsule Take 1 capsule (50,000 Units total) by mouth every 7 (seven) days. (Patient taking differently: Take 50,000 Units by mouth every  Friday.) 5 capsule 6  . vitamin E (VITAMIN E) 180 MG (400 UNITS) capsule Take 1 capsule (400 Units total) by mouth daily. 30 capsule 1     ROS:                                                                                                                                       Has a 4/10 headache. Denies fever or chills. No SOB or CP, although she has had these symptoms in the past. Poor insight and AMS limits patient's ability to provide a comprehensive ROS.    Blood pressure (!) 173/90, pulse (!) 121, temperature 97.9 F (36.6 C), temperature source Oral, resp. rate 20, SpO2 100 %.   General Examination:                                                                                                       Physical Exam  HEENT-  [place]/AT. Skin to face and forehead shiny/oily and slightly warm to touch.    Lungs- Respirations unlabored Extremities- Noncyanotic. No pallor.   Neurological Examination Mental Status: Drowsy. Speech is sparse but fluent without dysarthria. Mildly increased latencies of verbal responses. Oriented to the day, month and year, but not the city or state. Naming is intact. Comprehension intact. Affect flattened.  Cranial Nerves: II: Visual fields are intact. No extinction to DSS. PERRL. Visual acuity is grossly normal.   III,IV, VI: EOMI. Intermittent 1-2 beats of nystagmus with far rightward gaze. No ptosis.  V,VII: Smile symmetric, facial temp sensation equal bilaterally VIII: Hearing intact to voice IX,X: No hypophonia XI: Head is midline XII: Midline tongue extension Motor: Right : Upper extremity   5/5    Left:     Upper extremity   5/5  Lower extremity   5/5     Lower extremity   5/5 Tone is normal  Sensory: Temp sensation intact x 4 Deep Tendon Reflexes: 1+ biceps, brachioradialis, patellae and achilles bilaterally  Plantars: Right: downgoing   Left: downgoing Cerebellar: No ataxia with FNF and H-S bilaterally  Gait: Deferred   Lab Results: Basic  Metabolic Panel: Recent Labs  Lab 03/14/21 1818 03/14/21 1834 03/14/21 1910  NA 135  --  142  K 4.2  --  3.2*  CL 103  --   --   CO2 22  --   --   GLUCOSE 111*  --   --   BUN 13  --   --   CREATININE 0.95  --   --   CALCIUM 9.3  --   --   MG  --  1.9  --     CBC: Recent Labs  Lab 03/14/21 1910 03/14/21 1929  WBC  --  10.7*  NEUTROABS  --  7.9*  HGB 11.2* 10.6*  HCT 33.0* 35.6*  MCV  --  69.7*  PLT  --  270    Cardiac Enzymes: No results for input(s): CKTOTAL, CKMB, CKMBINDEX, TROPONINI in the last 168 hours.  Lipid Panel: No results for input(s): CHOL, TRIG, HDL, CHOLHDL, VLDL, LDLCALC in the last 168 hours.  Imaging: CT HEAD WO CONTRAST  Result Date: 03/14/2021 CLINICAL DATA:  Possible drug overdose unresponsive EXAM: CT HEAD WITHOUT CONTRAST TECHNIQUE: Contiguous axial images were obtained from the base of the skull through the vertex without intravenous contrast. COMPARISON:  CT 01/08/2021 FINDINGS: Brain: No evidence of acute infarction, hemorrhage, hydrocephalus, extra-axial collection or mass lesion/mass effect. Vascular: No hyperdense vessel or unexpected calcification. Skull: Normal. Negative for fracture or focal lesion. Sinuses/Orbits: No acute finding. Other: None. IMPRESSION: Negative non contrasted CT appearance of the brain Electronically Signed   By: Donavan Foil M.D.   On: 03/14/2021 19:31   DG Chest Port 1 View  Result Date: 03/14/2021 CLINICAL DATA:  Suspected overdose. EXAM: PORTABLE CHEST 1 VIEW COMPARISON:  November 12, 2020 FINDINGS: Cardiomediastinal silhouette is normal. Mediastinal contours appear intact. There is no evidence of focal airspace consolidation, pleural effusion or pneumothorax. Scarring versus atelectasis in the right mid lung field. Low lung volumes. Osseous structures are without acute abnormality. Soft tissues are grossly normal. IMPRESSION: 1. Low lung volumes. 2. Scarring versus atelectasis in the right mid lung field.  Electronically Signed   By: Fidela Salisbury M.D.   On: 03/14/2021 19:25    Assessment: 45 year old female presenting with AMS and autonomic changes after probable overuse of her recently prescribed SSRI  1.  Exam reveals a drowsy patient with slowed mentation and moderated disorientation. No hyperreflexia, adventitious movements, ocular motility abnormality or agitation noted. However, she is tachycardic with elevated SBP and her skin is somewhat warm to touch.  2. Most likely component of her DDx is serotonin syndrome. Seizure with postictal state is also on the DDx.   Recommendations: 1. Discontinue her Elavil (tricyclic antidepressant) and milnacipran (SNRI).  2. Although not an SSRI, methocarbamol has also been associated with serotonin syndrome and should be held as well.  3. Although Wellbutrin is an SDRI and not an SSRI, serotonin syndrome induced by this medication has been reported in the literature and therefore it should be held.   4. IV Ativan 2 mg q10 minutes titrated towards goal of normalizing heart rate and BP. Can also be titrated to mitigate any agitation which may occur.  5. Frequent vitals and neuro checks.  6. IVF 7. EEG (ordered)    Electronically signed: Dr. Kerney Elbe 03/14/2021, 11:26 PM

## 2021-03-14 NOTE — ED Provider Notes (Signed)
[facility] EMERGENCY DEPARTMENT Provider Note   CSN: [id number] Arrival date & time: 03/14/21  1806     History Chief Complaint  Patient presents with  . Drug Overdose    Jill Shaw is a 45 y.o. female.  HPI   Patient presents to the emergency room for evaluation of a suspected overdose.  Patient was seen at home earlier today around noon.  That was the last time she was observed to be acting normally.  Family found her this evening on her bed minimally responsive.  They also found 2 empty bottles of Savella at the bedside and these were just recently prescribed earlier this month.  Patient herself is not able to provide any history.  There is a note in the electronic medical record from the patient to her doctor at [facility] indicated that she has been taking the new medication 2 to 3 pills at night and that was still not helping.  Patient was asking about taking Ambien as well because the medications that she was currently taking were not working.  Patient was admitted to the hospital back on March 26 for altered mental status and it was felt to be related to her medications  Past Medical History:  Diagnosis Date  . Anemia   . Arthritis    knees, hands  . Asthma   . Chronic diastolic (congestive) heart failure ([place])   . COPD (chronic obstructive pulmonary disease) ([place])   . Diabetes mellitus without complication ([place])    type 2  . Dysfunctional uterine bleeding   . GERD (gastroesophageal reflux disease)   . Hypertension   . Neuromuscular disorder (HCC)    neuropathy feet  . Seizures ([place]) 09/12/2017   pt states r/t stress and blood sugar - no meds last one 4 months ago, not seen neurologist  . Sickle cell trait ([place])   . Smoker   . Vitamin D deficiency 10/2019  . Wears glasses     Patient Active Problem List   Diagnosis Date Noted  . Dyslipidemia 02/16/2021  . Insulin resistance 02/16/2021  . Type 2  diabetes mellitus without complication, with long-term current use of insulin ([place]) 02/16/2021  . Amitriptyline overdose, accidental or unintentional, initial encounter 01/09/2021  . Ingestion of substance, undetermined intent, initial encounter 01/08/2021  . Hyperglycemia due to type 2 diabetes mellitus ([place]) 01/08/2021  . SIRS (systemic inflammatory response syndrome) ([place]) 01/08/2021  . Exertional chest pain 12/31/2020  . SOB (shortness of breath) 12/24/2020  . DKA (diabetic ketoacidosis) ([place]) 10/15/2020  . Hyperkalemia 10/15/2020  . Severe sepsis ([place]) 10/15/2020  . Acute metabolic encephalopathy 44/81/8563  . Sinus tachycardia 10/14/2020  . Precordial pain 10/14/2020  . Abnormal findings on diagnostic imaging of lung 08/19/2020  . Physical deconditioning 08/19/2020  . History of endometrial ablation 07/27/2020  . History of alcohol use 07/20/2020  . Current moderate episode of major depressive disorder without prior episode ([place]) 07/20/2020  . Sickle cell trait ([place]) 07/19/2020  . Elevated d-dimer 07/13/2020  . Healthcare maintenance 07/12/2020  . Exertional dyspnea 07/12/2020  . Atypical chest pain 07/12/2020  . At risk for obstructive sleep apnea 07/12/2020  . DKA (diabetic ketoacidoses) 07/02/2020  . Acute on chronic diastolic (congestive) heart failure ([place]) 06/22/2020  . Abdominal pain 03/08/2020  . Chronic diastolic CHF (congestive heart failure) ([place]) 03/08/2020  . AKI (acute kidney injury) ([place]) 01/29/2020  . Chronic anemia 12/19/2019  . Elevated sed rate 11/26/2019  . Elevated  C-reactive protein (CRP) 11/26/2019  . Hypoglycemia 02/07/2019  . Hypotension 02/07/2019  . Lactic acidosis 02/07/2019  . Right ankle pain 02/07/2019  . Chronic obstructive pulmonary disease ([place]) 02/05/2019  . Seizure ([place]) 07/31/2018  . Vitamin D deficiency 05/31/2017  . Gastroesophageal reflux disease 05/24/2017  . Abnormal uterine bleeding (AUB) 05/17/2017  . Anemia of chronic disease  04/06/2017  . Knee pain, chronic 03/27/2016  . Controlled type 2 diabetes mellitus without complication, with long-term current use of insulin ([place]) 03/27/2016  . Essential hypertension 03/27/2016  . Obesity, Class III, BMI 40-49.9 (morbid obesity) ([place]) 03/27/2016  . Irritable bowel syndrome with constipation 03/27/2016  . Tobacco dependence 03/27/2016  . CKD (chronic kidney disease) stage 2, GFR 60-89 ml/min 03/25/2016  . Arthropathy, lower leg 05/04/2013  . CTS (carpal tunnel syndrome) 05/04/2013  . Peripheral edema 05/04/2013  . Chronic pain 05/13/2012  . Diabetic gastroparesis ([place]) 11/06/2006  . Hot flashes 11/06/2006    Past Surgical History:  Procedure Laterality Date  . CESAREAN SECTION     x 1. for twins  . DILATION AND CURETTAGE OF UTERUS N/A 08/20/2019   Procedure: DILATATION AND CURETTAGE;  Surgeon: Emily Filbert, MD;  Location: [facility];  Service: Gynecology;  Laterality: N/A;  . ENDOMETRIAL ABLATION N/A 08/20/2019   Procedure: [place] Ablation;  Surgeon: Emily Filbert, MD;  Location: [facility];  Service: Gynecology;  Laterality: N/A;  . EYE SURGERY Bilateral    laser right and cataract removed left eye  . RADIOLOGY WITH ANESTHESIA N/A 09/16/2019   Procedure: MRI WITH ANESTHESIA   L SPINE WITHOUT CONTRAST, T SPINE WITHOUT CONTRAST , CERVICAL WITHOUT CONTRAST;  Surgeon: Radiologist, Medication, MD;  Location: [facility];  Service: Radiology;  Laterality: N/A;  . TUBAL LIGATION     interval BTL  . UPPER GI ENDOSCOPY  07/2017     OB History    Gravida  3   Para      Term      Preterm      AB      Living  3     SAB      IAB      Ectopic      Multiple      Live Births  3        Obstetric Comments  Vaginal x 1 and then c-section for twins        Family History  Problem Relation Age of Onset  . Diabetes Mother   . Hypertension Mother     Social History   Tobacco Use  . Smoking status: Former Smoker    Packs/day: 0.25    Years: 26.00    Pack years:  6.50    Types: Cigarettes    Quit date: 09/13/2020    Years since quitting: 0.4  . Smokeless tobacco: Never Used  . Tobacco comment: 4-5 cigarettes/day  Vaping Use  . Vaping Use: Never used  Substance Use Topics  . Alcohol use: No  . Drug use: No    Home Medications Prior to Admission medications   Medication Sig Start Date End Date Taking? Authorizing Provider  ACCU-CHEK GUIDE test strip USE AS DIRECTED UP TO FOUR TIMES DAILY 11/11/20   Vevelyn Francois, NP  Accu-Chek Softclix Lancets lancets USE AS DIRECTED UP TO FOUR TIMES DAILY 02/17/21   Vevelyn Francois, NP  albuterol (PROVENTIL) (2.5 MG/3ML) 0.083% nebulizer solution Take 3 mLs (2.5 mg total) by nebulization every 6 (six) hours as needed for wheezing  or shortness of breath. 09/27/20   Icard, Octavio Graves, DO  albuterol (VENTOLIN HFA) 108 (90 Base) MCG/ACT inhaler Inhale 2 puffs into the lungs every 6 (six) hours as needed for wheezing or shortness of breath. 09/27/20   June Leap L, DO  blood glucose meter kit and supplies KIT 1 each by Other route See admin instructions. Dispense based on patient and insurance preference. Use up to four times daily as directed. (FOR ICD-9 250.00, 250.01). 09/02/20   Vevelyn Francois, NP  budesonide-formoterol (SYMBICORT) 160-4.5 MCG/ACT inhaler INHALE 2 PUFFS INTO THE LUNGS 2 (TWO) TIMES DAILY. Patient taking differently: Inhale 2 puffs into the lungs 2 (two) times daily. 09/27/20   Icard, Octavio Graves, DO  buPROPion (WELLBUTRIN SR) 150 MG 12 hr tablet Take 1 tablet (150 mg total) by mouth 2 (two) times daily. 11/23/20 11/23/21  [name], MD  diclofenac Sodium (VOLTAREN) 1 % GEL Apply 4 g topically 4 (four) times daily as needed (pain). 08/15/20   [provider]  empagliflozin (JARDIANCE) 10 MG TABS tablet Take 1 tablet (10 mg total) by mouth daily before breakfast. 02/21/21   Shamleffer, Melanie Crazier, MD  ferrous sulfate 325 (65 FE) MG tablet Take 1 tablet (325 mg total) by mouth 3 (three)  times daily with meals. 12/02/20 12/02/21  Vevelyn Francois, NP  fluticasone (FLONASE) 50 MCG/ACT nasal spray Place 2 sprays into both nostrils daily. 12/02/20   Vevelyn Francois, NP  furosemide (LASIX) 40 MG tablet TAKE 1 TABLET(40 MG) BY MOUTH TWICE DAILY 02/08/21   Lorretta Harp, MD  Glucosamine Sulfate 1000 MG CAPS Take 1 capsule (1,000 mg total) by mouth 2 (two) times daily. Patient taking differently: Take 1,000 mg by mouth 2 (two) times daily. 08/22/19   Hilts, Legrand Como, MD  Heating Pads (HEATING PAD MOIST/DRY KING SZ) PADS 1 each by Does not apply route 4 (four) times daily as needed. 02/14/21   Raulkar, Clide Deutscher, MD  hydrochlorothiazide (MICROZIDE) 12.5 MG capsule Take 1 capsule (12.5 mg total) by mouth daily. 02/11/21 06/11/21  Cantwell, Celeste C, PA-C  HYDROcodone-acetaminophen (NORCO) 10-325 MG tablet Take 1 tablet by mouth 2 (two) times daily as needed. 02/23/21   Bayard Hugger, NP  hydrocortisone 2.5 % cream Apply topically 2 (two) times daily. Patient taking differently: Apply 1 application topically 2 (two) times daily. 09/22/20   Vevelyn Francois, NP  hydroquinone 4 % cream APPLY TOPICALLY TWICE DAILY Patient taking differently: Apply 1 application topically 2 (two) times daily as needed (pain). 09/13/20   Vevelyn Francois, NP  insulin glargine (LANTUS SOLOSTAR) 100 UNIT/ML Solostar Pen Inject 110 Units into the skin daily. 03/11/21   Shamleffer, Melanie Crazier, MD  insulin lispro (HUMALOG KWIKPEN) 100 UNIT/ML KwikPen Inject 40 Units into the skin 3 (three) times daily. 03/02/21   Shamleffer, Melanie Crazier, MD  Insulin Pen Needle 31G X 8 MM MISC 1 Device by Does not apply route in the morning, at noon, in the evening, and at bedtime. 02/16/21   Shamleffer, Melanie Crazier, MD  lidocaine (LIDODERM) 5 % Place 1 patch onto the skin daily. Remove & Discard patch within 12 hours or as directed by MD 01/07/21   Ranell Patrick, Clide Deutscher, MD  losartan (COZAAR) 50 MG tablet Take 1 tablet (50 mg total) by  mouth daily. TAKE 1 TABLET($RemoveBefor'50MG'dXKmxdiROcyu$  TOTAL) BY MOUTH EVERY DAY 12/02/20 12/02/21  Vevelyn Francois, NP  melatonin 3 MG TABS tablet Take 1 tablet (3 mg total) by mouth at  bedtime as needed. 01/12/21   Arrien, Jimmy Picket, MD  methocarbamol (ROBAXIN) 500 MG tablet Take 500-1,000 mg by mouth every 6 (six) hours as needed for muscle spasms. 12/21/20   [provider]  metoprolol succinate (TOPROL-XL) 200 MG 24 hr tablet Take 1 tablet (200 mg total) by mouth daily. Take with or immediately following a meal. Patient taking differently: Take 200 mg by mouth daily. 10/14/20   Patwardhan, Reynold Bowen, MD  Milnacipran HCl (SAVELLA) 25 MG TABS Take 1 tablet (25 mg total) by mouth at bedtime. 03/08/21   Raulkar, Clide Deutscher, MD  nicotine (NICODERM CQ) 21 mg/24hr patch Place 1 patch (21 mg total) onto the skin daily. 07/12/20   Lauraine Rinne, NP  norethindrone (AYGESTIN) 5 MG tablet Take 2 tablets (10 mg total) by mouth in the morning, at noon, in the evening, and at bedtime. With bleeding 01/14/21   Aletha Halim, MD  ondansetron (ZOFRAN ODT) 4 MG disintegrating tablet Take 1 tablet (4 mg total) by mouth every 8 (eight) hours as needed for nausea or vomiting. 12/23/20   Lucrezia Starch, MD  pantoprazole (PROTONIX) 40 MG tablet Take 1 tablet (40 mg total) by mouth daily. 12/02/20 12/02/21  Vevelyn Francois, NP  potassium chloride SA (KLOR-CON) 20 MEQ tablet Take 20 mEq by mouth daily. 11/16/20   [provider]  rOPINIRole (REQUIP) 0.25 MG tablet Take 1 tablet (0.25 mg total) by mouth 3 (three) times daily. 11/19/20 11/19/21  Izora Ribas, MD  rosuvastatin (CRESTOR) 5 MG tablet Take 1 tablet (5 mg total) by mouth daily. 08/11/20   Lorretta Harp, MD  saxagliptin HCl (ONGLYZA) 5 MG TABS tablet Take 1 tablet (5 mg total) by mouth daily. 12/02/20 12/02/21  Vevelyn Francois, NP  spironolactone (ALDACTONE) 50 MG tablet TAKE 1 TABLET(50 MG) BY MOUTH DAILY Patient taking differently: Take 50 mg by mouth daily. 12/22/20    Patwardhan, Reynold Bowen, MD  Tiotropium Bromide Monohydrate (SPIRIVA RESPIMAT) 2.5 MCG/ACT AERS Inhale 2 puffs into the lungs daily. 09/27/20   Icard, Octavio Graves, DO  topiramate (TOPAMAX) 25 MG tablet Take 1 tablet (25 mg total) by mouth 2 (two) times daily. 01/31/21   Raulkar, Clide Deutscher, MD  Vitamin D, Ergocalciferol, (DRISDOL) 1.25 MG (50000 UNIT) CAPS capsule Take 1 capsule (50,000 Units total) by mouth every 7 (seven) days. Patient taking differently: Take 50,000 Units by mouth every Friday. 12/02/19   Vevelyn Francois, NP  vitamin E (VITAMIN E) 180 MG (400 UNITS) capsule Take 1 capsule (400 Units total) by mouth daily. 01/07/21   Izora Ribas, MD    Allergies    Ketoprofen, Aspirin, Gabapentin, Ibuprofen, Liraglutide, Naproxen, Omeprazole-sodium bicarbonate, Sulfa antibiotics, and Tramadol  Review of Systems   Review of Systems  All other systems reviewed and are negative.   Physical Exam Updated Vital Signs BP (!) 173/90   Pulse (!) 121   Temp 97.9 F (36.6 C) (Oral)   Resp 20   SpO2 100%   Physical Exam Vitals and nursing note reviewed.  Constitutional:      Appearance: She is well-developed. She is ill-appearing. She is not diaphoretic.  HENT:     Head: Normocephalic and atraumatic.     Right Ear: External ear normal.     Left Ear: External ear normal.  Eyes:     General: No scleral icterus.       Right eye: No discharge.        Left eye: No  discharge.     Conjunctiva/sclera: Conjunctivae normal.  Neck:     Trachea: No tracheal deviation.  Cardiovascular:     Rate and Rhythm: Regular rhythm. Tachycardia present.  Pulmonary:     Effort: Pulmonary effort is normal. No respiratory distress.     Breath sounds: Normal breath sounds. No stridor. No wheezing or rales.  Abdominal:     General: Bowel sounds are normal. There is no distension.     Palpations: Abdomen is soft.     Tenderness: There is no abdominal tenderness. There is no guarding or rebound.   Musculoskeletal:        General: No tenderness.     Cervical back: Neck supple.  Skin:    General: Skin is warm and dry.     Findings: No rash.  Neurological:     GCS: GCS eye subscore is 4. GCS verbal subscore is 4. GCS motor subscore is 6.     Cranial Nerves: No cranial nerve deficit (no facial droop, extraocular movements intact, slurred speechb).     Sensory: No sensory deficit.     Motor: Tremor (around the mouth) present. No abnormal muscle tone or seizure activity.     Comments: Patient is clearly altered.  She does not always answer questions when spoken to.  She is not consistently following commands however at her best response she did tell me her name.  She also would squeeze my fingers when I asked her to.     ED Results / Procedures / Treatments   Labs (all labs ordered are listed, but only abnormal results are displayed) Labs Reviewed  COMPREHENSIVE METABOLIC PANEL - Abnormal; Notable for the following components:      Result Value   Glucose, Bld 111 (*)    All other components within normal limits  SALICYLATE LEVEL - Abnormal; Notable for the following components:   Salicylate Lvl <6.3 (*)    All other components within normal limits  ACETAMINOPHEN LEVEL - Abnormal; Notable for the following components:   Acetaminophen (Tylenol), Serum <10 (*)    All other components within normal limits  CBC WITH DIFFERENTIAL/PLATELET - Abnormal; Notable for the following components:   WBC 10.7 (*)    Hemoglobin 10.6 (*)    HCT 35.6 (*)    MCV 69.7 (*)    MCH 20.7 (*)    MCHC 29.8 (*)    RDW 23.9 (*)    Neutro Abs 7.9 (*)    All other components within normal limits  CBG MONITORING, ED - Abnormal; Notable for the following components:   Glucose-Capillary 123 (*)    All other components within normal limits  I-STAT VENOUS BLOOD GAS, ED - Abnormal; Notable for the following components:   pH, Ven 7.462 (*)    pCO2, Ven 29.9 (*)    pO2, Ven 86.0 (*)    Potassium 3.2 (*)     Calcium, Ion 1.06 (*)    HCT 33.0 (*)    Hemoglobin 11.2 (*)    All other components within normal limits  ETHANOL  MAGNESIUM  RAPID URINE DRUG SCREEN, HOSP PERFORMED  CBC WITH DIFFERENTIAL/PLATELET  BLOOD GAS, VENOUS  I-STAT BETA HCG BLOOD, ED ([facility], [facility], [facility] ONLY)    EKG EKG Interpretation  Date/Time:  Monday Mar 14 2021 18:13:38 EDT Ventricular Rate:  123 PR Interval:  150 QRS Duration: 92 QT Interval:  320 QTC Calculation: 458 R Axis:   14 Text Interpretation: Sinus tachycardia Abnormal R-wave progression, late transition Since  last tracing rate faster Confirmed by Linwood Dibbles [phone]) on 03/14/2021 6:31:21 PM   Radiology CT HEAD WO CONTRAST  Result Date: 03/14/2021 CLINICAL DATA:  Possible drug overdose unresponsive EXAM: CT HEAD WITHOUT CONTRAST TECHNIQUE: Contiguous axial images were obtained from the base of the skull through the vertex without intravenous contrast. COMPARISON:  CT 01/08/2021 FINDINGS: Brain: No evidence of acute infarction, hemorrhage, hydrocephalus, extra-axial collection or mass lesion/mass effect. Vascular: No hyperdense vessel or unexpected calcification. Skull: Normal. Negative for fracture or focal lesion. Sinuses/Orbits: No acute finding. Other: None. IMPRESSION: Negative non contrasted CT appearance of the brain Electronically Signed   By: Jasmine Pang M.D.   On: 03/14/2021 19:31   DG Chest Port 1 View  Result Date: 03/14/2021 CLINICAL DATA:  Suspected overdose. EXAM: PORTABLE CHEST 1 VIEW COMPARISON:  November 12, 2020 FINDINGS: Cardiomediastinal silhouette is normal. Mediastinal contours appear intact. There is no evidence of focal airspace consolidation, pleural effusion or pneumothorax. Scarring versus atelectasis in the right mid lung field. Low lung volumes. Osseous structures are without acute abnormality. Soft tissues are grossly normal. IMPRESSION: 1. Low lung volumes. 2. Scarring versus atelectasis in the right mid lung field. Electronically Signed    By: Ted Mcalpine M.D.   On: 03/14/2021 19:25    Procedures .Critical Care Performed by: Linwood Dibbles, MD Authorized by: Linwood Dibbles, MD   Critical care provider statement:    Critical care time (minutes):  35   Critical care was time spent personally by me on the following activities:  Discussions with consultants, evaluation of patient's response to treatment, examination of patient, ordering and performing treatments and interventions, ordering and review of laboratory studies, ordering and review of radiographic studies, pulse oximetry, re-evaluation of patient's condition, obtaining history from patient or surrogate and review of old charts     Medications Ordered in ED Medications  sodium chloride 0.9 % bolus 500 mL (500 mLs Intravenous New Bag/Given 03/14/21 2028)    Followed by  0.9 %  sodium chloride infusion (1,000 mLs Intravenous New Bag/Given 03/14/21 2028)    ED Course  I have reviewed the triage vital signs and the nursing notes.  Pertinent labs & imaging results that were available during my care of the patient were reviewed by me and considered in my medical decision making (see chart for details).  Clinical Course as of 03/14/21 2141  Mon Mar 14, 2021  1830 Discussed poison control.  May see tachycardia, htn, cns depression, seizure. Serotonin syndrome [JK]  1948 Venous pH does not show evidence of hypercarbia [JK]  1948 Alcohol salicylate and acetaminophen are negative [JK]  1948 Head CT without acute findings [JK]  1948 Chest x-ray without acute findings [JK]  2128 Hemoglobin stable compared to baseline [JK]  2139 Patient still remains somnolent but mental status stable [JK]    Clinical Course User Index [JK] Linwood Dibbles, MD   MDM Rules/Calculators/A&P                          Patient presented to the ED for evaluation of altered mental status after presumed drug overdose.  Patient was noted to have empty pill bottles of Savella.  Unclear if there is any  attempt at self-harm versus patient taking the medication more than indicated because of her chronic pain issues.  Patient does have CNS depression from the overdose otherwise no signs of infection or serious head injury.  Case was discussed with the poison center.  Supportive care is indicated.  With the patient's persistent somnolence and tachycardia will consult the medical service for admission and further treatment  Final Clinical Impression(s) / ED Diagnoses Final diagnoses:  Drug overdose, undetermined intent, initial encounter      Dorie Rank, MD 03/14/21 2142

## 2021-03-14 NOTE — ED Notes (Signed)
CBG 110. 

## 2021-03-14 NOTE — ED Notes (Signed)
Patient transported to CT 

## 2021-03-14 NOTE — H&P (Signed)
History and Physical    Jill Shaw FAO:[id number] DOB: [birth year] DOA: 03/14/2021  PCP: Vevelyn Francois, NP  Patient coming from: Home  I have personally briefly reviewed patient's old medical records in [place]  Chief Complaint: AMS, OD  HPI: DESIRE FULP is a 45 y.o. female with medical history significant of DM2, dCHF, neuropathy.  Pt LKW at home earlier today around noon.  Family found her this evening on bed minimally responsive.  Also found 2 empty bottles of Savella at bedside, these were just prescribed to pt earlier this month!  Pt herself has AMS and not able to provide any history.  However, there is a note in the EMR from pt messaging her doctor just earlier today (7:45 AM) that she indicated Savella wasn't helping and she was taking the new medication 2-3 pills at night.  Pt admitted for OD back on March 26th as well (looks like it was amitriptyline and zanaflex that time around according to DC summary).   ED Course: CT head neg.  Other lab-work unremarkable.   Review of Systems: Unable to perform due to non-verbal AMS.  Past Medical History:  Diagnosis Date  . Anemia   . Arthritis    knees, hands  . Asthma   . Chronic diastolic (congestive) heart failure ([place])   . COPD (chronic obstructive pulmonary disease) ([place])   . Diabetes mellitus without complication ([place])    type 2  . Dysfunctional uterine bleeding   . GERD (gastroesophageal reflux disease)   . Hypertension   . Neuromuscular disorder (HCC)    neuropathy feet  . Seizures ([place]) 09/12/2017   pt states r/t stress and blood sugar - no meds last one 4 months ago, not seen neurologist  . Sickle cell trait ([place])   . Smoker   . Vitamin D deficiency 10/2019  . Wears glasses     Past Surgical History:  Procedure Laterality Date  . CESAREAN SECTION     x 1. for twins  . DILATION AND CURETTAGE OF UTERUS N/A 08/20/2019   Procedure: DILATATION AND CURETTAGE;  Surgeon: Emily Filbert, MD;   Location: [place];  Service: Gynecology;  Laterality: N/A;  . ENDOMETRIAL ABLATION N/A 08/20/2019   Procedure: [place] Ablation;  Surgeon: Emily Filbert, MD;  Location: [place];  Service: Gynecology;  Laterality: N/A;  . EYE SURGERY Bilateral    laser right and cataract removed left eye  . RADIOLOGY WITH ANESTHESIA N/A 09/16/2019   Procedure: MRI WITH ANESTHESIA   L SPINE WITHOUT CONTRAST, T SPINE WITHOUT CONTRAST , CERVICAL WITHOUT CONTRAST;  Surgeon: Radiologist, Medication, MD;  Location: [place];  Service: Radiology;  Laterality: N/A;  . TUBAL LIGATION     interval BTL  . UPPER GI ENDOSCOPY  07/2017     reports that she quit smoking about 5 months ago. Her smoking use included cigarettes. She has a 6.50 pack-year smoking history. She has never used smokeless tobacco. She reports that she does not drink alcohol and does not use drugs.  Allergies  Allergen Reactions  . Ketoprofen Nausea And Vomiting  . Aspirin Nausea Only  . Gabapentin Nausea And Vomiting and Other (See Comments)    upset stomach  . Ibuprofen Nausea And Vomiting  . Liraglutide Nausea And Vomiting  . Naproxen Nausea And Vomiting  . Omeprazole-Sodium Bicarbonate Nausea And Vomiting  . Sulfa Antibiotics Nausea And Vomiting  . Tramadol Nausea And Vomiting and Other (See Comments)    stomach upset  Family History  Problem Relation Age of Onset  . Diabetes Mother   . Hypertension Mother      Prior to Admission medications   Medication Sig Start Date End Date Taking? Authorizing Provider  ACCU-CHEK GUIDE test strip USE AS DIRECTED UP TO FOUR TIMES DAILY 11/11/20   Vevelyn Francois, NP  Accu-Chek Softclix Lancets lancets USE AS DIRECTED UP TO FOUR TIMES DAILY 02/17/21   Vevelyn Francois, NP  albuterol (PROVENTIL) (2.5 MG/3ML) 0.083% nebulizer solution Take 3 mLs (2.5 mg total) by nebulization every 6 (six) hours as needed for wheezing or shortness of breath. 09/27/20   Icard, Octavio Graves, DO  albuterol (VENTOLIN HFA) 108 (90  Base) MCG/ACT inhaler Inhale 2 puffs into the lungs every 6 (six) hours as needed for wheezing or shortness of breath. 09/27/20   Icard, Octavio Graves, DO  amitriptyline (ELAVIL) 150 MG tablet Take 150 mg by mouth at bedtime. 02/13/21   [provider]  blood glucose meter kit and supplies KIT 1 each by Other route See admin instructions. Dispense based on patient and insurance preference. Use up to four times daily as directed. (FOR ICD-9 250.00, 250.01). 09/02/20   Vevelyn Francois, NP  budesonide-formoterol (SYMBICORT) 160-4.5 MCG/ACT inhaler INHALE 2 PUFFS INTO THE LUNGS 2 (TWO) TIMES DAILY. Patient taking differently: Inhale 2 puffs into the lungs 2 (two) times daily. 09/27/20   Icard, Octavio Graves, DO  buPROPion (WELLBUTRIN SR) 150 MG 12 hr tablet Take 1 tablet (150 mg total) by mouth 2 (two) times daily. 11/23/20 11/23/21  [name], MD  cetirizine (ZYRTEC) 10 MG tablet Take 10 mg by mouth daily. 02/25/21   [provider]  ciprofloxacin (CIPRO) 500 MG tablet Take 500 mg by mouth See admin instructions. Bid x 3 days    [provider]  diclofenac Sodium (VOLTAREN) 1 % GEL Apply 4 g topically 4 (four) times daily as needed (pain). 08/15/20   [provider]  diltiazem (CARDIZEM CD) 180 MG 24 hr capsule Take 180 mg by mouth daily. 02/11/21   [provider]  empagliflozin (JARDIANCE) 10 MG TABS tablet Take 1 tablet (10 mg total) by mouth daily before breakfast. 02/21/21   Shamleffer, Melanie Crazier, MD  FARXIGA 5 MG TABS tablet Take 5 mg by mouth daily. 03/04/21   [provider]  ferrous sulfate 325 (65 FE) MG tablet Take 1 tablet (325 mg total) by mouth 3 (three) times daily with meals. 12/02/20 12/02/21  Vevelyn Francois, NP  fluticasone (FLONASE) 50 MCG/ACT nasal spray Place 2 sprays into both nostrils daily. 12/02/20   Vevelyn Francois, NP  furosemide (LASIX) 40 MG tablet TAKE 1 TABLET(40 MG) BY MOUTH TWICE DAILY Patient taking differently: Take 40 mg by  mouth 2 (two) times daily. 02/08/21   Lorretta Harp, MD  Glucosamine Sulfate 1000 MG CAPS Take 1 capsule (1,000 mg total) by mouth 2 (two) times daily. Patient taking differently: Take 1,000 mg by mouth 2 (two) times daily. 08/22/19   Hilts, Legrand Como, MD  Heating Pads (HEATING PAD MOIST/DRY KING SZ) PADS 1 each by Does not apply route 4 (four) times daily as needed. 02/14/21   Raulkar, Clide Deutscher, MD  hydrochlorothiazide (MICROZIDE) 12.5 MG capsule Take 1 capsule (12.5 mg total) by mouth daily. 02/11/21 06/11/21  Cantwell, Celeste C, PA-C  HYDROcodone-acetaminophen (NORCO) 10-325 MG tablet Take 1 tablet by mouth 2 (two) times daily as needed. 02/23/21   Bayard Hugger, NP  hydrocortisone 2.5 %  cream Apply topically 2 (two) times daily. Patient taking differently: Apply 1 application topically 2 (two) times daily. 09/22/20   Vevelyn Francois, NP  hydroquinone 4 % cream APPLY TOPICALLY TWICE DAILY Patient taking differently: Apply 1 application topically 2 (two) times daily as needed (pain). 09/13/20   Vevelyn Francois, NP  insulin glargine (LANTUS SOLOSTAR) 100 UNIT/ML Solostar Pen Inject 110 Units into the skin daily. 03/11/21   Shamleffer, Melanie Crazier, MD  insulin lispro (HUMALOG KWIKPEN) 100 UNIT/ML KwikPen Inject 40 Units into the skin 3 (three) times daily. 03/02/21   Shamleffer, Melanie Crazier, MD  Insulin Pen Needle 31G X 8 MM MISC 1 Device by Does not apply route in the morning, at noon, in the evening, and at bedtime. 02/16/21   Shamleffer, Melanie Crazier, MD  lidocaine (LIDODERM) 5 % Place 1 patch onto the skin daily. Remove & Discard patch within 12 hours or as directed by MD 01/07/21   Ranell Patrick, Clide Deutscher, MD  losartan (COZAAR) 50 MG tablet Take 1 tablet (50 mg total) by mouth daily. TAKE 1 TABLET(50MG TOTAL) BY MOUTH EVERY DAY Patient taking differently: Take 50 mg by mouth daily. 12/02/20 12/02/21  Vevelyn Francois, NP  melatonin 3 MG TABS tablet Take 1 tablet (3 mg total) by mouth at bedtime as  needed. 01/12/21   Arrien, Jimmy Picket, MD  methocarbamol (ROBAXIN) 500 MG tablet Take 500-1,000 mg by mouth every 6 (six) hours as needed for muscle spasms. 12/21/20   [provider]  metoprolol succinate (TOPROL-XL) 100 MG 24 hr tablet Take 100 mg by mouth daily. Take with or immediately following a meal.    [provider]  metoprolol succinate (TOPROL-XL) 200 MG 24 hr tablet Take 1 tablet (200 mg total) by mouth daily. Take with or immediately following a meal. Patient taking differently: No sig reported 10/14/20   Patwardhan, Reynold Bowen, MD  Milnacipran HCl (SAVELLA) 25 MG TABS Take 1 tablet (25 mg total) by mouth at bedtime. 03/08/21   Raulkar, Clide Deutscher, MD  nicotine (NICODERM CQ) 21 mg/24hr patch Place 1 patch (21 mg total) onto the skin daily. 07/12/20   Lauraine Rinne, NP  norethindrone (AYGESTIN) 5 MG tablet Take 2 tablets (10 mg total) by mouth in the morning, at noon, in the evening, and at bedtime. With bleeding 01/14/21   Aletha Halim, MD  omeprazole (PRILOSEC) 40 MG capsule Take 40 mg by mouth daily.    [provider]  ondansetron (ZOFRAN ODT) 4 MG disintegrating tablet Take 1 tablet (4 mg total) by mouth every 8 (eight) hours as needed for nausea or vomiting. 12/23/20   Lucrezia Starch, MD  pantoprazole (PROTONIX) 40 MG tablet Take 1 tablet (40 mg total) by mouth daily. 12/02/20 12/02/21  Vevelyn Francois, NP  potassium chloride SA (KLOR-CON) 20 MEQ tablet Take 20 mEq by mouth daily. 11/16/20   [provider]  rOPINIRole (REQUIP) 0.25 MG tablet Take 1 tablet (0.25 mg total) by mouth 3 (three) times daily. 11/19/20 11/19/21  Izora Ribas, MD  rosuvastatin (CRESTOR) 5 MG tablet Take 1 tablet (5 mg total) by mouth daily. 08/11/20   Lorretta Harp, MD  saxagliptin HCl (ONGLYZA) 5 MG TABS tablet Take 1 tablet (5 mg total) by mouth daily. 12/02/20 12/02/21  Vevelyn Francois, NP  spironolactone (ALDACTONE) 50 MG tablet TAKE 1 TABLET(50 MG) BY MOUTH  DAILY Patient taking differently: Take 50 mg by mouth daily. 12/22/20   Patwardhan, Reynold Bowen, MD  Tiotropium Bromide Monohydrate (SPIRIVA RESPIMAT) 2.5 MCG/ACT AERS Inhale 2 puffs into the lungs daily. 09/27/20   Icard, Octavio Graves, DO  tiZANidine (ZANAFLEX) 4 MG capsule Take 4 mg by mouth 3 (three) times daily. 03/02/21   [provider]  topiramate (TOPAMAX) 25 MG tablet Take 1 tablet (25 mg total) by mouth 2 (two) times daily. 01/31/21   Raulkar, Clide Deutscher, MD  traZODone (DESYREL) 100 MG tablet Take 100 mg by mouth at bedtime.    [provider]  Vitamin D, Ergocalciferol, (DRISDOL) 1.25 MG (50000 UNIT) CAPS capsule Take 1 capsule (50,000 Units total) by mouth every 7 (seven) days. Patient taking differently: Take 50,000 Units by mouth every Friday. 12/02/19   Vevelyn Francois, NP  vitamin E (VITAMIN E) 180 MG (400 UNITS) capsule Take 1 capsule (400 Units total) by mouth daily. 01/07/21   Izora Ribas, MD    Physical Exam: Vitals:   03/14/21 1845 03/14/21 1900 03/14/21 1945 03/14/21 2015  BP: (!) 145/80 (!) 150/80 (!) 168/97 (!) 173/90  Pulse: (!) 130 (!) 128 (!) 119 (!) 121  Resp: (!) 21 (!) 23 (!) 21 20  Temp:      TempSrc:      SpO2: 98% 98% 100% 100%    Constitutional: Altered, opens eyes to voice. Eyes: PERRL, lids and conjunctivae normal ENMT: Mucous membranes are moist. Posterior pharynx clear of any exudate or lesions.Normal dentition.  Neck: normal, supple, no masses, no thyromegaly Respiratory: clear to auscultation bilaterally, no wheezing, no crackles. Normal respiratory effort. No accessory muscle use.  Cardiovascular: Tachycardic. Abdomen: no tenderness, no masses palpated. No hepatosplenomegaly. Bowel sounds positive.  Musculoskeletal: no clubbing / cyanosis. No joint deformity upper and lower extremities. Good ROM, no contractures. Normal muscle tone.  Skin: no rashes, lesions, ulcers. No induration Neurologic: Non verbal, tremor around mouth noted,  wandering eye movements noted.  Does follow commands though not consistently. Psychiatric: Altered.   Labs on Admission: I have personally reviewed following labs and imaging studies  CBC: Recent Labs  Lab 03/14/21 1910 03/14/21 1929  WBC  --  10.7*  NEUTROABS  --  7.9*  HGB 11.2* 10.6*  HCT 33.0* 35.6*  MCV  --  69.7*  PLT  --  264   Basic Metabolic Panel: Recent Labs  Lab 03/14/21 1818 03/14/21 1834 03/14/21 1910  NA 135  --  142  K 4.2  --  3.2*  CL 103  --   --   CO2 22  --   --   GLUCOSE 111*  --   --   BUN 13  --   --   CREATININE 0.95  --   --   CALCIUM 9.3  --   --   MG  --  1.9  --    GFR: CrCl cannot be calculated (Unknown ideal weight.). Liver Function Tests: Recent Labs  Lab 03/14/21 1818  AST 37  ALT 16  ALKPHOS 89  BILITOT 0.4  PROT 7.3  ALBUMIN 3.7   No results for input(s): LIPASE, AMYLASE in the last 168 hours. No results for input(s): AMMONIA in the last 168 hours. Coagulation Profile: No results for input(s): INR, PROTIME in the last 168 hours. Cardiac Enzymes: No results for input(s): CKTOTAL, CKMB, CKMBINDEX, TROPONINI in the last 168 hours. BNP (last 3 results) Recent Labs    07/12/20 1649  PROBNP 57.0   HbA1C: No results for input(s): HGBA1C in the last 72 hours. CBG: Recent Labs  Lab  03/14/21 1845  GLUCAP 123*   Lipid Profile: No results for input(s): CHOL, HDL, LDLCALC, TRIG, CHOLHDL, LDLDIRECT in the last 72 hours. Thyroid Function Tests: No results for input(s): TSH, T4TOTAL, FREET4, T3FREE, THYROIDAB in the last 72 hours. Anemia Panel: No results for input(s): VITAMINB12, FOLATE, FERRITIN, TIBC, IRON, RETICCTPCT in the last 72 hours. Urine analysis:    Component Value Date/Time   COLORURINE RED (A) 01/08/2021 2202   APPEARANCEUR TURBID (A) 01/08/2021 2202   LABSPEC  01/08/2021 2202    TEST NOT REPORTED DUE TO COLOR INTERFERENCE OF URINE PIGMENT   PHURINE  01/08/2021 2202    TEST NOT REPORTED DUE TO COLOR  INTERFERENCE OF URINE PIGMENT   GLUCOSEU (A) 01/08/2021 2202    TEST NOT REPORTED DUE TO COLOR INTERFERENCE OF URINE PIGMENT   HGBUR (A) 01/08/2021 2202    TEST NOT REPORTED DUE TO COLOR INTERFERENCE OF URINE PIGMENT   BILIRUBINUR (A) 01/08/2021 2202    TEST NOT REPORTED DUE TO COLOR INTERFERENCE OF URINE PIGMENT   BILIRUBINUR negative 06/02/2020 1419   BILIRUBINUR NEGATIVE 11/05/2019 1342   KETONESUR (A) 01/08/2021 2202    TEST NOT REPORTED DUE TO COLOR INTERFERENCE OF URINE PIGMENT   PROTEINUR (A) 01/08/2021 2202    TEST NOT REPORTED DUE TO COLOR INTERFERENCE OF URINE PIGMENT   UROBILINOGEN 0.2 11/12/2020 1148   NITRITE (A) 01/08/2021 2202    TEST NOT REPORTED DUE TO COLOR INTERFERENCE OF URINE PIGMENT   LEUKOCYTESUR (A) 01/08/2021 2202    TEST NOT REPORTED DUE TO COLOR INTERFERENCE OF URINE PIGMENT    Radiological Exams on Admission: CT HEAD WO CONTRAST  Result Date: 03/14/2021 CLINICAL DATA:  Possible drug overdose unresponsive EXAM: CT HEAD WITHOUT CONTRAST TECHNIQUE: Contiguous axial images were obtained from the base of the skull through the vertex without intravenous contrast. COMPARISON:  CT 01/08/2021 FINDINGS: Brain: No evidence of acute infarction, hemorrhage, hydrocephalus, extra-axial collection or mass lesion/mass effect. Vascular: No hyperdense vessel or unexpected calcification. Skull: Normal. Negative for fracture or focal lesion. Sinuses/Orbits: No acute finding. Other: None. IMPRESSION: Negative non contrasted CT appearance of the brain Electronically Signed   By: Donavan Foil M.D.   On: 03/14/2021 19:31   DG Chest Port 1 View  Result Date: 03/14/2021 CLINICAL DATA:  Suspected overdose. EXAM: PORTABLE CHEST 1 VIEW COMPARISON:  November 12, 2020 FINDINGS: Cardiomediastinal silhouette is normal. Mediastinal contours appear intact. There is no evidence of focal airspace consolidation, pleural effusion or pneumothorax. Scarring versus atelectasis in the right mid lung  field. Low lung volumes. Osseous structures are without acute abnormality. Soft tissues are grossly normal. IMPRESSION: 1. Low lung volumes. 2. Scarring versus atelectasis in the right mid lung field. Electronically Signed   By: Fidela Salisbury M.D.   On: 03/14/2021 19:25    EKG: Independently reviewed.  Assessment/Plan Principal Problem:   Serotonin syndrome Active Problems:   Overdose, undetermined intent, initial encounter    1. Serotonin syndrome - 1. Tremor + occular clonus + suspected OD of SNRI, highly suspicious for Serotonin syndrome 1. Also has tachycardia to 116.  HTN with SBP 160s. 2. Holding home meds (not taking POs right now anyhow). 3. IVF 4. Tele monitor 5. HR 116 and SBP 160s only, dont think we need esmolol at this point. 6. Curb siding Dr. Cheral Marker: 1. He will see patient 2. Ativan 13m Q10 min PRN if needed for agitation 1. Dosent seem to have agitation at this point however. 2. OD - 1. Call psych  when pt wakes up and able to communicate.  DVT prophylaxis: Lovenox Code Status: Full Family Communication: No family in room Disposition Plan: TBD Consults called: Curb siding Dr. Cheral Marker Admission status: Place in obs   Jayveon Convey, [place] Hospital[facility]  How to contact the [facility] Attending or Consulting provider [place] or covering provider during after hours [place], for this patient?  1. Check the care team in [facility] and look for a) attending/consulting [facility] provider listed and b) the [facility] team listed 2. Log into [email or web address]  Amion Physician Scheduling and messaging for groups and whole hospitals  On call and physician scheduling software for group practices, residents, hospitalists and other medical providers for call, clinic, rotation and shift schedules. On[employer] is a hospital-wide system for scheduling doctors and paging doctors on call. EasyPlot is for scientific plotting and data analysis.  [email or web address]  and use [place]'s universal  password to access. If you do not have the password, please contact the hospital operator.  3. Locate the [facility] provider you are looking for under [facility] and page to a number that you can be directly reached. 4. If you still have difficulty reaching the provider, please page the [facility] (Director on Call) for the Hospitalists listed on amion for assistance.  03/14/2021, 10:56 PM

## 2021-03-15 ENCOUNTER — Observation Stay (HOSPITAL_COMMUNITY): Payer: Medicaid Other

## 2021-03-15 DIAGNOSIS — Z794 Long term (current) use of insulin: Secondary | ICD-10-CM | POA: Diagnosis not present

## 2021-03-15 DIAGNOSIS — E119 Type 2 diabetes mellitus without complications: Secondary | ICD-10-CM

## 2021-03-15 DIAGNOSIS — G2579 Other drug induced movement disorders: Secondary | ICD-10-CM | POA: Diagnosis not present

## 2021-03-15 DIAGNOSIS — T50904A Poisoning by unspecified drugs, medicaments and biological substances, undetermined, initial encounter: Secondary | ICD-10-CM | POA: Diagnosis not present

## 2021-03-15 LAB — CBC
HCT: 33.7 % — ABNORMAL LOW (ref 36.0–46.0)
Hemoglobin: 10.1 g/dL — ABNORMAL LOW (ref 12.0–15.0)
MCH: 20.9 pg — ABNORMAL LOW (ref 26.0–34.0)
MCHC: 30 g/dL (ref 30.0–36.0)
MCV: 69.8 fL — ABNORMAL LOW (ref 80.0–100.0)
Platelets: 243 10*3/uL (ref 150–400)
RBC: 4.83 MIL/uL (ref 3.87–5.11)
RDW: 23.7 % — ABNORMAL HIGH (ref 11.5–15.5)
WBC: 9.5 10*3/uL (ref 4.0–10.5)
nRBC: 0 % (ref 0.0–0.2)

## 2021-03-15 LAB — RAPID URINE DRUG SCREEN, HOSP PERFORMED
Amphetamines: NOT DETECTED
Barbiturates: NOT DETECTED
Benzodiazepines: NOT DETECTED
Cocaine: NOT DETECTED
Opiates: POSITIVE — AB
Tetrahydrocannabinol: NOT DETECTED

## 2021-03-15 LAB — CBG MONITORING, ED
Glucose-Capillary: 262 mg/dL — ABNORMAL HIGH (ref 70–99)
Glucose-Capillary: 264 mg/dL — ABNORMAL HIGH (ref 70–99)
Glucose-Capillary: 262 mg/dL — ABNORMAL HIGH (ref 70–99)
Glucose-Capillary: 303 mg/dL — ABNORMAL HIGH (ref 70–99)

## 2021-03-15 LAB — GLUCOSE, CAPILLARY
Glucose-Capillary: 357 mg/dL — ABNORMAL HIGH (ref 70–99)
Glucose-Capillary: 415 mg/dL — ABNORMAL HIGH (ref 70–99)
Glucose-Capillary: 465 mg/dL — ABNORMAL HIGH (ref 70–99)
Glucose-Capillary: 362 mg/dL — ABNORMAL HIGH (ref 70–99)

## 2021-03-15 LAB — COMPREHENSIVE METABOLIC PANEL
ALT: 12 U/L (ref 0–44)
AST: 21 U/L (ref 15–41)
Albumin: 3.4 g/dL — ABNORMAL LOW (ref 3.5–5.0)
Alkaline Phosphatase: 72 U/L (ref 38–126)
Anion gap: 9 (ref 5–15)
BUN: 10 mg/dL (ref 6–20)
CO2: 21 mmol/L — ABNORMAL LOW (ref 22–32)
Calcium: 9.2 mg/dL (ref 8.9–10.3)
Chloride: 107 mmol/L (ref 98–111)
Creatinine, Ser: 0.92 mg/dL (ref 0.44–1.00)
GFR, Estimated: >60 mL/min (ref >60)
Glucose, Bld: 218 mg/dL — ABNORMAL HIGH (ref 70–99)
Potassium: 3.8 mmol/L (ref 3.5–5.1)
Sodium: 137 mmol/L (ref 135–145)
Total Bilirubin: 0.4 mg/dL (ref 0.3–1.2)
Total Protein: 6.5 g/dL (ref 6.5–8.1)

## 2021-03-15 MED ORDER — HYDROCODONE-ACETAMINOPHEN 5-325 MG PO TABS
2.0000 | ORAL_TABLET | Freq: Two times a day (BID) | ORAL | Status: DC | PRN
  Administered 2021-03-15 – 2021-03-16 (×2): 2 via ORAL
  Filled 2021-03-15 (×2): qty 2

## 2021-03-15 MED ORDER — INSULIN ASPART 100 UNIT/ML IJ SOLN
40.0000 [IU] | Freq: Once | INTRAMUSCULAR | Status: AC
  Administered 2021-03-15: 40 [IU] via SUBCUTANEOUS

## 2021-03-15 MED ORDER — INSULIN GLARGINE 100 UNIT/ML ~~LOC~~ SOLN
60.0000 [IU] | Freq: Once | SUBCUTANEOUS | Status: AC
  Administered 2021-03-15: 60 [IU] via SUBCUTANEOUS
  Filled 2021-03-15: qty 0.6

## 2021-03-15 MED ORDER — NICOTINE 21 MG/24HR TD PT24
21.0000 mg | MEDICATED_PATCH | Freq: Every day | TRANSDERMAL | Status: DC
  Administered 2021-03-15: 21 mg via TRANSDERMAL
  Filled 2021-03-15: qty 1

## 2021-03-15 MED ORDER — INSULIN ASPART 100 UNIT/ML IJ SOLN
40.0000 [IU] | Freq: Three times a day (TID) | INTRAMUSCULAR | Status: DC
  Administered 2021-03-16 (×2): 40 [IU] via SUBCUTANEOUS

## 2021-03-15 NOTE — Progress Notes (Signed)
EEG complete - results pending 

## 2021-03-15 NOTE — Procedures (Signed)
Patient Name: Jill Shaw  MRN: [id number]  Epilepsy Attending: Charlsie Quest  Referring Physician/Provider: Dr Caryl Pina Date: 03/15/2021 Duration:   Patient history: 45 year old female presenting with AMS and autonomic changes after probable overuse of her recently prescribed SSRI. EEG to evaluate for seizure.   Level of alertness: Awake  AEDs during EEG study: None  Technical aspects: This EEG study was done with scalp electrodes positioned according to the 10-20 International system of electrode placement. Electrical activity was acquired at a sampling rate of 500Hz  and reviewed with a high frequency filter of 70Hz  and a low frequency filter of 1Hz . EEG data were recorded continuously and digitally stored.   Description: The posterior dominant rhythm consists of 8-9 Hz activity of moderate voltage (25-35 uV) seen predominantly in posterior head regions, symmetric and reactive to eye opening and eye closing. Hyperventilation and photic stimulation were not performed.     IMPRESSION: This study is within normal limits. No seizures or epileptiform discharges were seen throughout the recording.   Jill Shaw 

## 2021-03-15 NOTE — ED Notes (Signed)
Pt linen, bed pad, and periwick changed. Pt lunch tray given to her. Pt eating lunch.

## 2021-03-15 NOTE — ED Notes (Signed)
Attempted x 1  

## 2021-03-15 NOTE — ED Notes (Signed)
MD Lindzen requested call/text when pt's BP below 140 systolic [phone]

## 2021-03-15 NOTE — Care Plan (Signed)
Patient's EEG was resulted without evidence of epileptogenic activity  Neurology will sign off at this time.  Defer titration of psychiatric medications to psychiatry.  Please reach out if new neurological questions arise.  Brooke Dare MD-PhD [facility] [phone] Available 7 AM to 7 PM, outside these hours please contact Neurologist on call listed on AMION

## 2021-03-15 NOTE — Progress Notes (Signed)
[facility]  PROGRESS NOTE  Jill Shaw:[id number] DOB: 1976-08-29 DOA: 03/14/2021 PCP: Barbette Merino, NP   Brief HPI:   45 year old female with history of diabetes mellitus type 2, diastolic CHF, neuropathy worse found last known well at home around noontime yesterday.  Family found her in evening on bed minimally responsive.  Also found to empty bottles of Savella at bedside.  This was prescribed to patient earlier this month.  Patient was brought to hospital with altered mental status.  Patient admitted with serotonin syndrome.  In the ED, patient told ED provider that [facility] was not working so she was taking the new medication 2 to 3 pills at night.  CT head was negative.  Patient was admitted with overdose back in March 26 as well at that time it was amitriptyline and Zanaflex overdose.    Subjective   Patient seen, awake this morning.  Answering questions appropriately.  Patient denies any suicidal ideations.  Says that medication was not working so she was taking 2 to 3 pills every night.  She was told by her healthcare provider to that.   Assessment/Plan:     Serotonin syndrome -Presented with ocular clonus, tremor, tachycardia -Started on as needed Ativan as per neurology recommendation -Continue LR at 125 mill per hour -Denies suicidal ideation or attempt -We will consult psych to adjust her home medications   Diabetes mellitus type 2 -CBG elevated, continue Lantus 55 units subcu daily -Continue moderate sliding scale insulin with NovoLog -Change diet to carb modified        Scheduled medications:   . enoxaparin (LOVENOX) injection  40 mg Subcutaneous Q24H  . insulin aspart  0-15 Units Subcutaneous Q4H  . insulin glargine  55 Units Subcutaneous Daily         Data Reviewed:   CBG:  Recent Labs  Lab 03/14/21 2354 03/15/21 0402 03/15/21 0728 03/15/21 1112 03/15/21 1340  GLUCAP 138* 262* 264* 262* 303*    SpO2: 100 %    Vitals:    03/15/21 0445 03/15/21 0715 03/15/21 1000 03/15/21 1100  BP: (!) 143/65 (!) 117/56 (!) 142/73 (!) 148/76  Pulse: (!) 108 (!) 110 (!) 102 (!) 104  Resp: (!) 22 20 (!) 21 (!) 21  Temp:      TempSrc:      SpO2: 100% 100% 100% 100%     Intake/Output Summary (Last 24 hours) at 03/15/2021 1411 Last data filed at 03/15/2021 0440 Gross per 24 hour  Intake 1525 ml  Output --  Net 1525 ml    05/29 1901 - 05/31 0700 In: 1525 [I.V.:1025] Out: -   There were no vitals filed for this visit.  CBC:  Recent Labs  Lab 03/14/21 1910 03/14/21 1929 03/15/21 0239  WBC  --  10.7* 9.5  HGB 11.2* 10.6* 10.1*  HCT 33.0* 35.6* 33.7*  PLT  --  270 243  MCV  --  69.7* 69.8*  MCH  --  20.7* 20.9*  MCHC  --  29.8* 30.0  RDW  --  23.9* 23.7*  LYMPHSABS  --  1.7  --   MONOABS  --  0.7  --   EOSABS  --  0.3  --   BASOSABS  --  0.1  --     Complete metabolic panel:  Recent Labs  Lab 03/14/21 1818 03/14/21 1834 03/14/21 1910 03/15/21 0239  NA 135  --  142 137  K 4.2  --  3.2* 3.8  CL 103  --   --  107  CO2 22  --   --  21*  GLUCOSE 111*  --   --  218*  BUN 13  --   --  10  CREATININE 0.95  --   --  0.92  CALCIUM 9.3  --   --  9.2  AST 37  --   --  21  ALT 16  --   --  12  ALKPHOS 89  --   --  72  BILITOT 0.4  --   --  0.4  ALBUMIN 3.7  --   --  3.4*  MG  --  1.9  --   --     No results for input(s): LIPASE, AMYLASE in the last 168 hours.  No results for input(s): CRP, DDIMER, BNP, PROCALCITON, SARSCOV2NAA in the last 168 hours.  Invalid input(s): LACTICACID  ------------------------------------------------------------------------------------------------------------------ No results for input(s): CHOL, HDL, LDLCALC, TRIG, CHOLHDL, LDLDIRECT in the last 72 hours.  Lab Results  Component Value Date   HGBA1C 10.7 (A) 02/16/2021   ------------------------------------------------------------------------------------------------------------------ No results for input(s): TSH,  T4TOTAL, T3FREE, THYROIDAB in the last 72 hours.  Invalid input(s): FREET3 ------------------------------------------------------------------------------------------------------------------ No results for input(s): VITAMINB12, FOLATE, FERRITIN, TIBC, IRON, RETICCTPCT in the last 72 hours.  Coagulation profile No results for input(s): INR, PROTIME in the last 168 hours. No results for input(s): DDIMER in the last 72 hours.  Cardiac Enzymes No results for input(s): CKTOTAL, CKMB, CKMBINDEX, TROPONINI in the last 168 hours.  ------------------------------------------------------------------------------------------------------------------    Component Value Date/Time   BNP 49.3 11/20/2020 1034   BNP 67.5 03/30/2017 1438     Antibiotics: Anti-infectives (From admission, onward)   None       Radiology Reports  CT HEAD WO CONTRAST  Result Date: 03/14/2021 CLINICAL DATA:  Possible drug overdose unresponsive EXAM: CT HEAD WITHOUT CONTRAST TECHNIQUE: Contiguous axial images were obtained from the base of the skull through the vertex without intravenous contrast. COMPARISON:  CT 01/08/2021 FINDINGS: Brain: No evidence of acute infarction, hemorrhage, hydrocephalus, extra-axial collection or mass lesion/mass effect. Vascular: No hyperdense vessel or unexpected calcification. Skull: Normal. Negative for fracture or focal lesion. Sinuses/Orbits: No acute finding. Other: None. IMPRESSION: Negative non contrasted CT appearance of the brain Electronically Signed   By: Jasmine Pang M.D.   On: 03/14/2021 19:31   DG Chest Port 1 View  Result Date: 03/14/2021 CLINICAL DATA:  Suspected overdose. EXAM: PORTABLE CHEST 1 VIEW COMPARISON:  November 12, 2020 FINDINGS: Cardiomediastinal silhouette is normal. Mediastinal contours appear intact. There is no evidence of focal airspace consolidation, pleural effusion or pneumothorax. Scarring versus atelectasis in the right mid lung field. Low lung volumes.  Osseous structures are without acute abnormality. Soft tissues are grossly normal. IMPRESSION: 1. Low lung volumes. 2. Scarring versus atelectasis in the right mid lung field. Electronically Signed   By: Ted Mcalpine M.D.   On: 03/14/2021 19:25      DVT prophylaxis: Lovenox  Code Status: Full code  Family Communication: No family at bedside   Consultants:    Procedures:      Objective    Physical Examination:    General: Appears in no acute distress  Cardiovascular: S1-S2, regular  Respiratory: Clear to auscultation bilaterally  Abdomen: Abdomen is soft, nontender, no organomegaly  Extremities: No edema in the lower extremities  Neurologic: Alert, oriented x3, no focal deficit noted, ocular clonus noted   Status is: Inpatient  Dispo: The patient is from: Home  Anticipated d/c is to: Home              Anticipated d/c date is: 03/16/2021              Patient currently not stable for discharge  Barrier to discharge-ongoing treatment for serotonin syndrome  COVID-19 Labs  No results for input(s): DDIMER, FERRITIN, LDH, CRP in the last 72 hours.  Lab Results  Component Value Date   SARSCOV2NAA NEGATIVE 01/08/2021   SARSCOV2NAA NEGATIVE 12/24/2020   SARSCOV2NAA POSITIVE (A) 11/12/2020   SARSCOV2NAA NEGATIVE 10/15/2020    Microbiology  No results found for this or any previous visit (from the past 240 hour(s)).      Meredeth Ide   [facility] If 7PM-7AM, please contact night-coverage at [email or web address], Office  [phone]   03/15/2021, 2:11 PM  LOS: 0 days

## 2021-03-15 NOTE — ED Notes (Signed)
MD Lindzen notified of B/P with systolic < 140 per his request.

## 2021-03-15 NOTE — Progress Notes (Signed)
[name] from poison control called.  I reported stable vitals and more or less baseline mental status, and he stated they would sign off.  He did say to call at [phone] if there were further concerns.

## 2021-03-16 DIAGNOSIS — T50904A Poisoning by unspecified drugs, medicaments and biological substances, undetermined, initial encounter: Secondary | ICD-10-CM | POA: Diagnosis not present

## 2021-03-16 DIAGNOSIS — G2579 Other drug induced movement disorders: Secondary | ICD-10-CM | POA: Diagnosis not present

## 2021-03-16 LAB — GLUCOSE, CAPILLARY
Glucose-Capillary: 190 mg/dL — ABNORMAL HIGH (ref 70–99)
Glucose-Capillary: 200 mg/dL — ABNORMAL HIGH (ref 70–99)
Glucose-Capillary: 170 mg/dL — ABNORMAL HIGH (ref 70–99)

## 2021-03-16 LAB — SARS CORONAVIRUS 2 (TAT 6-24 HRS): SARS Coronavirus 2: NEGATIVE

## 2021-03-16 MED ORDER — HYDROXYZINE HCL 25 MG PO TABS
25.0000 mg | ORAL_TABLET | Freq: Three times a day (TID) | ORAL | 2 refills | Status: DC | PRN
Start: 2021-03-16 — End: 2021-03-23

## 2021-03-16 MED ORDER — MORPHINE SULFATE (PF) 2 MG/ML IV SOLN
2.0000 mg | INTRAVENOUS | Status: DC | PRN
  Filled 2021-03-16: qty 1

## 2021-03-16 NOTE — Progress Notes (Signed)
Patient ready to discharge, IV and telemetry removed, discharge instructions reviewed with patient and confirmed understanding through teach-back. Patient will be transported home in a private vehicle by her son.

## 2021-03-16 NOTE — Progress Notes (Signed)
Inpatient Diabetes Program Recommendations  AACE/ADA: New Consensus Statement on Inpatient Glycemic Control (2015)  Target Ranges:  Prepandial:   less than 140 mg/dL      Peak postprandial:   less than 180 mg/dL (1-2 hours)      Critically ill patients:  140 - 180 mg/dL   Lab Results  Component Value Date   GLUCAP 170 (H) 03/16/2021   HGBA1C 10.7 (A) 02/16/2021   Spoke with patient regarding diabetes management. Patient is followed by Dr Lonzo Cloud, outpatient endocrinology. Has been working with patient over the course of the last month to obtain Lantus. Prescription has been sent.  Reviewed patient's current A1c of 10.7%. Explained what a A1c is and what it measures. Also reviewed goal A1c with patient, importance of good glucose control @ home, and blood sugar goals. Encouraged to pick up prescription for Lantus and take as prescribed. Reviewed additional insulins with patient. Patient plans to go to pharmacy following discharge and follow up with [facility]. No further questions at this time.   Thanks, Lujean Rave, MSN, RNC-OB Diabetes Coordinator [phone] (8a-5p)

## 2021-03-16 NOTE — Discharge Summary (Addendum)
Physician Discharge Summary  Jill Shaw OZD:[id number] DOB: 1975/12/29 DOA: 03/14/2021  PCP: Vevelyn Francois, NP  Admit date: 03/14/2021 Discharge date: 03/16/2021  Admitted From: Home  Discharge disposition: Home   Recommendations for Outpatient Follow-Up:   . Follow up with your primary care provider in one week.  . Check CBC, BMP, magnesium in the next visit   Discharge Diagnosis:   Principal Problem:   Serotonin syndrome Active Problems:   Overdose, undetermined intent, initial encounter   Discharge Condition: Improved.  Diet recommendation: Low sodium, heart healthy. Carbohydrate-modified.   Wound care: None.  Code status: Full.   History of Present Illness:   Patient is a 45 years old female with history of diabetes mellitus type 2, diastolic heart failure, neuropathy was found in her bed minimally responsive with empty bottles of Savella.  This was prescribed earlier this month.  She was then brought into the hospital.  In the ED patient informed that she was taking her new medication off label at night as well.  CT head scan was negative.  Patient had an admission with overdose of amitriptyline and Zanaflex in March as well.    Hospital Course:   Following conditions were addressed during hospitalization as listed below,  Serotonin syndrome -Presented with ocular clonus, tremor, tachycardia on presentation.  Was on Ativan.  Poison control was consulted as well.  Denies any suicidal ideation.  IV fluids were given with Ringer lactate.  Psychiatry was consulted who recommended Atarax at nighttime and discontinue Savella.  Patient was advised to discontinue available medications. Psychiatry has cleared the patient for discharge.  Diabetes mellitus type 2 On Lantus at home.  Emphasized compliance with insulin and dietary regimen.  Hemoglobin A1c 7.   Disposition.  At this time, patient is stable for disposition with outpatient PCP follow-up.  Spoke with the  patient's mother at bedside.  Medical Consultants:    Psychiatry  Procedures:    EEG. Subjective:   Today, patient was seen and examined a lot of denies any dizziness, lightheadedness, nausea, vomiting, sweating or fever.  Denies suicidal ideation.  Discharge Exam:   Vitals:   03/16/21 0834 03/16/21 1135  BP: (!) 158/94 (!) 158/85  Pulse: (!) 106 100  Resp: 18 16  Temp: 98.8 F (37.1 C) 98.6 F (37 C)  SpO2: 100% 100%   Vitals:   03/16/21 0200 03/16/21 0437 03/16/21 0834 03/16/21 1135  BP: 133/74 (!) 161/96 (!) 158/94 (!) 158/85  Pulse: (!) 103 (!) 106 (!) 106 100  Resp: $Remo'18 19 18 16  'DDQbt$ Temp: 99 F (37.2 C) 98.9 F (37.2 C) 98.8 F (37.1 C) 98.6 F (37 C)  TempSrc: Temporal Oral Oral Oral  SpO2: 100% 100% 100% 100%    General: Alert awake, not in obvious distress, obese HENT: pupils equally reacting to light,  No scleral pallor or icterus noted. Oral mucosa is moist.  Chest:  Clear breath sounds.  Diminished breath sounds bilaterally. No crackles or wheezes.  CVS: S1 &S2 heard. No murmur.  Regular rate and rhythm. Abdomen: Soft, nontender, nondistended.  Bowel sounds are heard.   Extremities: No cyanosis, clubbing or edema.  Peripheral pulses are palpable. Psych: Alert, awake and oriented, normal mood CNS:  No cranial nerve deficits.  Power equal in all extremities.   Skin: Warm and dry.  No rashes noted.  The results of significant diagnostics from this hospitalization (including imaging, microbiology, ancillary and laboratory) are listed below for reference.     Diagnostic  Studies:   CT HEAD WO CONTRAST  Result Date: 03/14/2021 CLINICAL DATA:  Possible drug overdose unresponsive EXAM: CT HEAD WITHOUT CONTRAST TECHNIQUE: Contiguous axial images were obtained from the base of the skull through the vertex without intravenous contrast. COMPARISON:  CT 01/08/2021 FINDINGS: Brain: No evidence of acute infarction, hemorrhage, hydrocephalus, extra-axial collection or  mass lesion/mass effect. Vascular: No hyperdense vessel or unexpected calcification. Skull: Normal. Negative for fracture or focal lesion. Sinuses/Orbits: No acute finding. Other: None. IMPRESSION: Negative non contrasted CT appearance of the brain Electronically Signed   By: Donavan Foil M.D.   On: 03/14/2021 19:31   DG Chest Port 1 View  Result Date: 03/14/2021 CLINICAL DATA:  Suspected overdose. EXAM: PORTABLE CHEST 1 VIEW COMPARISON:  November 12, 2020 FINDINGS: Cardiomediastinal silhouette is normal. Mediastinal contours appear intact. There is no evidence of focal airspace consolidation, pleural effusion or pneumothorax. Scarring versus atelectasis in the right mid lung field. Low lung volumes. Osseous structures are without acute abnormality. Soft tissues are grossly normal. IMPRESSION: 1. Low lung volumes. 2. Scarring versus atelectasis in the right mid lung field. Electronically Signed   By: Fidela Salisbury M.D.   On: 03/14/2021 19:25   EEG adult  Result Date: 03/15/2021 Lora Havens, MD     03/15/2021  4:15 PM Patient Name: Jill Shaw MRN: [id number] Epilepsy Attending: Lora Havens Referring Physician/Provider: Dr Kerney Elbe Date: 03/15/2021 Duration: Patient history: 45 year old female presenting with AMS and autonomic changes after probable overuse of her recently prescribed SSRI. EEG to evaluate for seizure. Level of alertness: Awake AEDs during EEG study: None Technical aspects: This EEG study was done with scalp electrodes positioned according to the 10-20 International system of electrode placement. Electrical activity was acquired at a sampling rate of $Remov'500Hz'WrTOzC$  and reviewed with a high frequency filter of $RemoveB'70Hz'jdMHmOEu$  and a low frequency filter of $RemoveB'1Hz'KVapwqDr$ . EEG data were recorded continuously and digitally stored. Description: The posterior dominant rhythm consists of 8-9 Hz activity of moderate voltage (25-35 uV) seen predominantly in posterior head regions, symmetric and reactive to eye  opening and eye closing. Hyperventilation and photic stimulation were not performed.   IMPRESSION: This study is within normal limits. No seizures or epileptiform discharges were seen throughout the recording. Lora Havens     Labs:   Basic Metabolic Panel: Recent Labs  Lab 03/14/21 1818 03/14/21 1834 03/14/21 1910 03/15/21 0239  NA 135  --  142 137  K 4.2  --  3.2* 3.8  CL 103  --   --  107  CO2 22  --   --  21*  GLUCOSE 111*  --   --  218*  BUN 13  --   --  10  CREATININE 0.95  --   --  0.92  CALCIUM 9.3  --   --  9.2  MG  --  1.9  --   --    GFR CrCl cannot be calculated (Unknown ideal weight.). Liver Function Tests: Recent Labs  Lab 03/14/21 1818 03/15/21 0239  AST 37 21  ALT 16 12  ALKPHOS 89 72  BILITOT 0.4 0.4  PROT 7.3 6.5  ALBUMIN 3.7 3.4*   No results for input(s): LIPASE, AMYLASE in the last 168 hours. No results for input(s): AMMONIA in the last 168 hours. Coagulation profile No results for input(s): INR, PROTIME in the last 168 hours.  CBC: Recent Labs  Lab 03/14/21 1910 03/14/21 1929 03/15/21 0239  WBC  --  10.7* 9.5  NEUTROABS  --  7.9*  --   HGB 11.2* 10.6* 10.1*  HCT 33.0* 35.6* 33.7*  MCV  --  69.7* 69.8*  PLT  --  270 243   Cardiac Enzymes: No results for input(s): CKTOTAL, CKMB, CKMBINDEX, TROPONINI in the last 168 hours. BNP: Invalid input(s): POCBNP CBG: Recent Labs  Lab 03/15/21 2046 03/15/21 2328 03/16/21 0437 03/16/21 0836 03/16/21 1135  GLUCAP 415* 362* 190* 200* 170*   D-Dimer No results for input(s): DDIMER in the last 72 hours. Hgb A1c No results for input(s): HGBA1C in the last 72 hours. Lipid Profile No results for input(s): CHOL, HDL, LDLCALC, TRIG, CHOLHDL, LDLDIRECT in the last 72 hours. Thyroid function studies No results for input(s): TSH, T4TOTAL, T3FREE, THYROIDAB in the last 72 hours.  Invalid input(s): FREET3 Anemia work up No results for input(s): VITAMINB12, FOLATE, FERRITIN, TIBC, IRON,  RETICCTPCT in the last 72 hours. Microbiology Recent Results (from the past 240 hour(s))  SARS CORONAVIRUS 2 (TAT 6-24 HRS) Nasopharyngeal Nasopharyngeal Swab     Status: None   Collection Time: 03/16/21  3:52 AM   Specimen: Nasopharyngeal Swab  Result Value Ref Range Status   SARS Coronavirus 2 NEGATIVE NEGATIVE Final    Comment: (NOTE) SARS-CoV-2 target nucleic acids are NOT DETECTED.  The SARS-CoV-2 RNA is generally detectable in upper and lower respiratory specimens during the acute phase of infection. Negative results do not preclude SARS-CoV-2 infection, do not rule out co-infections with other pathogens, and should not be used as the sole basis for treatment or other patient management decisions. Negative results must be combined with clinical observations, patient history, and epidemiological information. The expected result is Negative.  Fact Sheet for Patients: SugarRoll.be  Fact Sheet for Healthcare Providers: https://www.woods-mathews.com/  This test is not yet approved or cleared by the [place] FDA and  has been authorized for detection and/or diagnosis of SARS-CoV-2 by FDA under an Emergency Use Authorization (EUA). This EUA will remain  in effect (meaning this test can be used) for the duration of the COVID-19 declaration under Se ction 564(b)(1) of the Act, 21 U.S.C. section 360bbb-3(b)(1), unless the authorization is terminated or revoked sooner.  Performed at [facility], [place] [address] [place], [place] [address]      Discharge Instructions:   Discharge Instructions    Diet - low sodium heart healthy   Complete by: As directed    Discharge instructions   Complete by: As directed    Follow-up with your primary care physician in 1 week.  Please do not take off label medications.  Do not take Savella at home.   Increase activity slowly   Complete by: As directed      Allergies as of 03/16/2021       Reactions   Ketoprofen Nausea And Vomiting   Aspirin Nausea Only   Gabapentin Nausea And Vomiting, Other (See Comments)   upset stomach   Ibuprofen Nausea And Vomiting   Liraglutide Nausea And Vomiting   Naproxen Nausea And Vomiting   Omeprazole-sodium Bicarbonate Nausea And Vomiting   Sulfa Antibiotics Nausea And Vomiting   Tramadol Nausea And Vomiting, Other (See Comments)   stomach upset      Medication List    STOP taking these medications   ciprofloxacin 500 MG tablet Commonly known as: CIPRO   Savella 25 MG Tabs Generic drug: Milnacipran HCl     TAKE these medications   Accu-Chek Guide test strip Generic drug: glucose blood  USE AS DIRECTED UP TO FOUR TIMES DAILY What changed: See the new instructions.   Accu-Chek Softclix Lancets lancets USE AS DIRECTED UP TO FOUR TIMES DAILY   albuterol (2.5 MG/3ML) 0.083% nebulizer solution Commonly known as: PROVENTIL Take 3 mLs (2.5 mg total) by nebulization every 6 (six) hours as needed for wheezing or shortness of breath.   albuterol 108 (90 Base) MCG/ACT inhaler Commonly known as: VENTOLIN HFA Inhale 2 puffs into the lungs every 6 (six) hours as needed for wheezing or shortness of breath.   amitriptyline 150 MG tablet Commonly known as: ELAVIL Take 150 mg by mouth at bedtime.   blood glucose meter kit and supplies Kit 1 each by Other route See admin instructions. Dispense based on patient and insurance preference. Use up to four times daily as directed. (FOR ICD-9 250.00, 250.01).   budesonide-formoterol 160-4.5 MCG/ACT inhaler Commonly known as: Symbicort INHALE 2 PUFFS INTO THE LUNGS 2 (TWO) TIMES DAILY. What changed:   how much to take  how to take this  when to take this  additional instructions   buPROPion 150 MG 12 hr tablet Commonly known as: Wellbutrin SR Take 1 tablet (150 mg total) by mouth 2 (two) times daily.   cetirizine 10 MG tablet Commonly known as: ZYRTEC Take 10 mg by mouth daily.    diclofenac Sodium 1 % Gel Commonly known as: VOLTAREN Apply 4 g topically 4 (four) times daily as needed (pain).   diltiazem 180 MG 24 hr capsule Commonly known as: CARDIZEM CD Take 180 mg by mouth daily.   empagliflozin 10 MG Tabs tablet Commonly known as: Jardiance Take 1 tablet (10 mg total) by mouth daily before breakfast.   Farxiga 5 MG Tabs tablet Generic drug: dapagliflozin propanediol Take 5 mg by mouth daily.   ferrous sulfate 325 (65 FE) MG tablet Take 1 tablet (325 mg total) by mouth 3 (three) times daily with meals.   fluticasone 50 MCG/ACT nasal spray Commonly known as: FLONASE Place 2 sprays into both nostrils daily.   furosemide 40 MG tablet Commonly known as: LASIX TAKE 1 TABLET(40 MG) BY MOUTH TWICE DAILY What changed: See the new instructions.   Glucosamine Sulfate 1000 MG Caps Take 1 capsule (1,000 mg total) by mouth 2 (two) times daily.   Heating Pad Moist/Dry King Sz Pads 1 each by Does not apply route 4 (four) times daily as needed.   hydrochlorothiazide 12.5 MG capsule Commonly known as: MICROZIDE Take 1 capsule (12.5 mg total) by mouth daily.   HYDROcodone-acetaminophen 10-325 MG tablet Commonly known as: NORCO Take 1 tablet by mouth 2 (two) times daily as needed.   hydrocortisone 2.5 % cream Apply topically 2 (two) times daily. What changed: how much to take   hydroquinone 4 % cream APPLY TOPICALLY TWICE DAILY What changed:   how much to take  when to take this  reasons to take this   hydrOXYzine 25 MG tablet Commonly known as: ATARAX/VISTARIL Take 1 tablet (25 mg total) by mouth 3 (three) times daily as needed (difficulty sleeping).   insulin lispro 100 UNIT/ML KwikPen Commonly known as: HumaLOG KwikPen Inject 40 Units into the skin 3 (three) times daily.   Insulin Pen Needle 31G X 8 MM Misc 1 Device by Does not apply route in the morning, at noon, in the evening, and at bedtime.   Lantus SoloStar 100 UNIT/ML Solostar  Pen Generic drug: insulin glargine Inject 110 Units into the skin daily.   lidocaine 5 % Commonly  known as: [place] 1 patch onto the skin daily. Remove & Discard patch within 12 hours or as directed by MD   losartan 50 MG tablet Commonly known as: COZAAR Take 1 tablet (50 mg total) by mouth daily. TAKE 1 TABLET($RemoveBefor'50MG'SLRUZIRSmsxh$  TOTAL) BY MOUTH EVERY DAY What changed: additional instructions   melatonin 3 MG Tabs tablet Take 1 tablet (3 mg total) by mouth at bedtime as needed.   methocarbamol 500 MG tablet Commonly known as: ROBAXIN Take 500-1,000 mg by mouth every 6 (six) hours as needed for muscle spasms.   metoprolol succinate 100 MG 24 hr tablet Commonly known as: TOPROL-XL Take 100 mg by mouth daily. Take with or immediately following a meal. What changed: Another medication with the same name was removed. Continue taking this medication, and follow the directions you see here.   nicotine 21 mg/24hr patch Commonly known as: Nicoderm CQ Place 1 patch (21 mg total) onto the skin daily.   norethindrone 5 MG tablet Commonly known as: Aygestin Take 2 tablets (10 mg total) by mouth in the morning, at noon, in the evening, and at bedtime. With bleeding   omeprazole 40 MG capsule Commonly known as: PRILOSEC Take 40 mg by mouth daily.   ondansetron 4 MG disintegrating tablet Commonly known as: Zofran ODT Take 1 tablet (4 mg total) by mouth every 8 (eight) hours as needed for nausea or vomiting.   pantoprazole 40 MG tablet Commonly known as: Protonix Take 1 tablet (40 mg total) by mouth daily.   potassium chloride SA 20 MEQ tablet Commonly known as: KLOR-CON Take 20 mEq by mouth daily.   rOPINIRole 0.25 MG tablet Commonly known as: Requip Take 1 tablet (0.25 mg total) by mouth 3 (three) times daily.   rosuvastatin 5 MG tablet Commonly known as: Crestor Take 1 tablet (5 mg total) by mouth daily.   saxagliptin HCl 5 MG Tabs tablet Commonly known as: Onglyza Take 1 tablet  (5 mg total) by mouth daily.   Spiriva Respimat 2.5 MCG/ACT Aers Generic drug: Tiotropium Bromide Monohydrate Inhale 2 puffs into the lungs daily.   spironolactone 50 MG tablet Commonly known as: ALDACTONE TAKE 1 TABLET(50 MG) BY MOUTH DAILY What changed: See the new instructions.   tiZANidine 4 MG capsule Commonly known as: ZANAFLEX Take 4 mg by mouth 3 (three) times daily.   topiramate 25 MG tablet Commonly known as: Topamax Take 1 tablet (25 mg total) by mouth 2 (two) times daily.   traZODone 100 MG tablet Commonly known as: DESYREL Take 100 mg by mouth at bedtime.   Vitamin D (Ergocalciferol) 1.25 MG (50000 UNIT) Caps capsule Commonly known as: DRISDOL Take 1 capsule (50,000 Units total) by mouth every 7 (seven) days. What changed: when to take this   vitamin E 180 MG (400 UNITS) capsule Commonly known as: vitamin E Take 1 capsule (400 Units total) by mouth daily.       Follow-up Information    Vevelyn Francois, NP. Schedule an appointment as soon as possible for a visit in 1 week(s).   Specialty: Adult Health Nurse Practitioner Contact information: [address] [place] [address] [phone]        Lorretta Harp, MD .   Specialties: Cardiology, Radiology Contact information: [address] [place] [address] [phone]                Time coordinating discharge: 39 minutes  Signed:  Mort Smelser  [facility] 03/16/2021,  2:46 PM

## 2021-03-16 NOTE — Consult Note (Signed)
[facility] Face-to-Face Psychiatry Consult   Reason for Consult:  Unintentional overdose on medication, medication adjustment Referring Physician:  Dr. Tyson Babinski Patient Identification: Jill Shaw MRN:  [id number] Principal Diagnosis: Serotonin syndrome Diagnosis:  Principal Problem:   Serotonin syndrome Active Problems:   Overdose, undetermined intent, initial encounter   Total Time spent with patient: 45 minutes  Subjective:   Jill Shaw is a 45 y.o. female patient admitted with unintentional overdose on medication. Patient was recently admitted in March 2022 for similar presentation after taking an unspecified amount of amitriptyline "pain medicine". As per patient she has been working closely with her PCP and pain management to trial medications for pain management to include Amitriptyline and Savella. She reports she was started on the [facility] for her chronic pain but it is used for depression. She denies any depressive symptoms at this time. She denies any suicidal ideations and denies any intent to harm herself and or end her life by taking multiple medications. She furthermore expresses concern about polypharmacy and unintentional overdose of medications. She denies any previous psychiatric history, reports her depressive symptoms are strongly related to her pain management and if she could get it under control, and her sleep. Her mother is present during this evaluation and voices the same concern about polypharmacy and medications she should continue and discontinue. She has no concerns about her safety.   On evaluation patient is alert and oriented, calm and cooperative, very pleasant upon approach.  She is noted to recently being admitted for an unintentional overdose on amitriptyline which was prescribed for sleep and chronic pain. Chart review does indicate that patient was advised to increase her dose of medications to further target pain.  Patient denies any access to weapons,  denies any alcohol and or substance abuse.  She reports poor sleep  2/t pain and fair appetite.  She also is receiving services through [facility] and integrated behavioral management, and her mother is available to assist with medication management in which she reports compliance with most of her appointments.  Patient denies any auditory and/or visual hallucinations, does not appear to be responding to internal or external stimuli.  There is no evidence of delusional thought content and patient appears to answer all questions appropriately.  At this time patient appears to be stable to discharge home, with support system services in place.    HPI:  Jill Shaw is a 45 y.o. female with medical history significant of DM2, dCHF, neuropathy. Pt LKW at home earlier today around noon.  Family found her this evening on bed minimally responsive.  Also found 2 empty bottles of Savella at bedside, these were just prescribed to pt earlier this month! Pt herself has AMS and not able to provide any history. However, there is a note in the EMR from pt messaging her doctor just earlier today (7:45 AM) that she indicated Savella wasn't helping and she was taking the new medication 2-3 pills at night. Pt admitted for OD back on March 26th as well (looks like it was amitriptyline and zanaflex that time around according to DC summary).   Past Psychiatric History:   Risk to Self:   Risk to Others:   Prior Inpatient Therapy:   Prior Outpatient Therapy:    Past Medical History:  Past Medical History:  Diagnosis Date  . Anemia   . Arthritis    knees, hands  . Asthma   . Chronic diastolic (congestive) heart failure (HCC)   . COPD (  chronic obstructive pulmonary disease) (HCC)   . Diabetes mellitus without complication (HCC)    type 2  . Dysfunctional uterine bleeding   . GERD (gastroesophageal reflux disease)   . Hypertension   . Neuromuscular disorder (HCC)    neuropathy feet  . Seizures (HCC)  09/12/2017   pt states r/t stress and blood sugar - no meds last one 4 months ago, not seen neurologist  . Sickle cell trait (HCC)   . Smoker   . Vitamin D deficiency 10/2019  . Wears glasses     Past Surgical History:  Procedure Laterality Date  . CESAREAN SECTION     x 1. for twins  . DILATION AND CURETTAGE OF UTERUS N/A 08/20/2019   Procedure: DILATATION AND CURETTAGE;  Surgeon: Allie Bossier, MD;  Location: [facility] OR;  Service: Gynecology;  Laterality: N/A;  . ENDOMETRIAL ABLATION N/A 08/20/2019   Procedure: Minerva Ablation;  Surgeon: Allie Bossier, MD;  Location: [facility] OR;  Service: Gynecology;  Laterality: N/A;  . EYE SURGERY Bilateral    laser right and cataract removed left eye  . RADIOLOGY WITH ANESTHESIA N/A 09/16/2019   Procedure: MRI WITH ANESTHESIA   L SPINE WITHOUT CONTRAST, T SPINE WITHOUT CONTRAST , CERVICAL WITHOUT CONTRAST;  Surgeon: Radiologist, Medication, MD;  Location: [facility] OR;  Service: Radiology;  Laterality: N/A;  . TUBAL LIGATION     interval BTL  . UPPER GI ENDOSCOPY  07/2017   Family History:  Family History  Problem Relation Age of Onset  . Diabetes Mother   . Hypertension Mother    Family Psychiatric  History: Denies  Social History:  Social History   Substance and Sexual Activity  Alcohol Use No     Social History   Substance and Sexual Activity  Drug Use No    Social History   Socioeconomic History  . Marital status: Legally Separated    Spouse name: Not on file  . Number of children: 3  . Years of education: Not on file  . Highest education level: Not on file  Occupational History  . Occupation: unemployed  Tobacco Use  . Smoking status: Former Smoker    Packs/day: 0.25    Years: 26.00    Pack years: 6.50    Types: Cigarettes    Quit date: 09/13/2020    Years since quitting: 0.5  . Smokeless tobacco: Never Used  . Tobacco comment: 4-5 cigarettes/day  Vaping Use  . Vaping Use: Never used  Substance and Sexual Activity  . Alcohol use:  No  . Drug use: No  . Sexual activity: Not Currently    Birth control/protection: None  Other Topics Concern  . Not on file  Social History Narrative   Right Handed   Lives in a one story apartment, but lives on the second floor   Drinks caffeine once in awhile   Social Determinants of Health   Financial Resource Strain: Not on file  Food Insecurity: No Food Insecurity  . Worried About Programme researcher, broadcasting/film/video in the Last Year: Never true  . Ran Out of Food in the Last Year: Never true  Transportation Needs: No Transportation Needs  . Lack of Transportation (Medical): No  . Lack of Transportation (Non-Medical): No  Physical Activity: Not on file  Stress: Not on file  Social Connections: Not on file   Additional Social History:    Allergies:   Allergies  Allergen Reactions  . Ketoprofen Nausea And Vomiting  . Aspirin Nausea Only  .  Gabapentin Nausea And Vomiting and Other (See Comments)    upset stomach  . Ibuprofen Nausea And Vomiting  . Liraglutide Nausea And Vomiting  . Naproxen Nausea And Vomiting  . Omeprazole-Sodium Bicarbonate Nausea And Vomiting  . Sulfa Antibiotics Nausea And Vomiting  . Tramadol Nausea And Vomiting and Other (See Comments)    stomach upset    Labs:  Results for orders placed or performed during the hospital encounter of 03/14/21 (from the past 48 hour(s))  Comprehensive metabolic panel     Status: Abnormal   Collection Time: 03/14/21  6:18 PM  Result Value Ref Range   Sodium 135 135 - 145 mmol/L   Potassium 4.2 3.5 - 5.1 mmol/L   Chloride 103 98 - 111 mmol/L   CO2 22 22 - 32 mmol/L   Glucose, Bld 111 (H) 70 - 99 mg/dL    Comment: Glucose reference range applies only to samples taken after fasting for at least 8 hours.   BUN 13 6 - 20 mg/dL   Creatinine, Ser 1.61 0.44 - 1.00 mg/dL   Calcium 9.3 8.9 - 09.6 mg/dL   Total Protein 7.3 6.5 - 8.1 g/dL   Albumin 3.7 3.5 - 5.0 g/dL   AST 37 15 - 41 U/L   ALT 16 0 - 44 U/L   Alkaline Phosphatase  89 38 - 126 U/L   Total Bilirubin 0.4 0.3 - 1.2 mg/dL   GFR, Estimated >04 >54 mL/min    Comment: (NOTE) Calculated using the CKD-EPI Creatinine Equation (2021)    Anion gap 10 5 - 15    Comment: Performed at [facility], [address] [place], [place] [address]  Salicylate level     Status: Abnormal   Collection Time: 03/14/21  6:18 PM  Result Value Ref Range   Salicylate Lvl <7.0 (L) 7.0 - 30.0 mg/dL    Comment: Performed at [facility], [address], [place], [place] [address]  Acetaminophen level     Status: Abnormal   Collection Time: 03/14/21  6:18 PM  Result Value Ref Range   Acetaminophen (Tylenol), Serum <10 (L) 10 - 30 ug/mL    Comment: (NOTE) Therapeutic concentrations vary significantly. A range of 10-30 ug/mL  may be an effective concentration for many patients. However, some  are best treated at concentrations outside of this range. Acetaminophen concentrations >150 ug/mL at 4 hours after ingestion  and >50 ug/mL at 12 hours after ingestion are often associated with  toxic reactions.  Performed at [facility], [address] [place], [place] [address]   Ethanol     Status: None   Collection Time: 03/14/21  6:18 PM  Result Value Ref Range   Alcohol, Ethyl (B) <10 <10 mg/dL    Comment: (NOTE) Lowest detectable limit for serum alcohol is 10 mg/dL.  For medical purposes only. Performed at [facility], [address], [place], [place] [address]   Magnesium     Status: None   Collection Time: 03/14/21  6:34 PM  Result Value Ref Range   Magnesium 1.9 1.7 - 2.4 mg/dL    Comment: Performed at [facility], [address] [place], [place] [address]  CBG monitoring, ED     Status: Abnormal   Collection Time: 03/14/21  6:45 PM  Result Value Ref Range   Glucose-Capillary 123 (H) 70 - 99 mg/dL    Comment: Glucose reference range applies only to samples taken after fasting for  at least 8 hours.  I-Stat beta hCG blood, ED      Status: None   Collection Time: 03/14/21  7:07 PM  Result Value Ref Range   I-stat hCG, quantitative <5.0 <5 mIU/mL   Comment 3            Comment:   GEST. AGE      CONC.  (mIU/mL)   <=1 WEEK        5 - 50     2 WEEKS       50 - 500     3 WEEKS       100 - 10,000     4 WEEKS     1,000 - 30,000        FEMALE AND NON-PREGNANT FEMALE:     LESS THAN 5 mIU/mL   I-Stat venous blood gas, ED     Status: Abnormal   Collection Time: 03/14/21  7:10 PM  Result Value Ref Range   pH, Ven 7.462 (H) 7.250 - 7.430   pCO2, Ven 29.9 (L) 44.0 - 60.0 mmHg   pO2, Ven 86.0 (H) 32.0 - 45.0 mmHg   Bicarbonate 21.4 20.0 - 28.0 mmol/L   TCO2 22 22 - 32 mmol/L   O2 Saturation 97.0 %   Acid-base deficit 2.0 0.0 - 2.0 mmol/L   Sodium 142 135 - 145 mmol/L   Potassium 3.2 (L) 3.5 - 5.1 mmol/L   Calcium, Ion 1.06 (L) 1.15 - 1.40 mmol/L   HCT 33.0 (L) 36.0 - 46.0 %   Hemoglobin 11.2 (L) 12.0 - 15.0 g/dL   Sample type VENOUS   CBC with Differential     Status: Abnormal   Collection Time: 03/14/21  7:29 PM  Result Value Ref Range   WBC 10.7 (H) 4.0 - 10.5 K/uL   RBC 5.11 3.87 - 5.11 MIL/uL   Hemoglobin 10.6 (L) 12.0 - 15.0 g/dL   HCT 16.1 (L) 09.6 - 04.5 %   MCV 69.7 (L) 80.0 - 100.0 fL   MCH 20.7 (L) 26.0 - 34.0 pg   MCHC 29.8 (L) 30.0 - 36.0 g/dL   RDW 40.9 (H) 81.1 - 91.4 %   Platelets 270 150 - 400 K/uL    Comment: REPEATED TO VERIFY   nRBC 0.0 0.0 - 0.2 %   Neutrophils Relative % 74 %   Neutro Abs 7.9 (H) 1.7 - 7.7 K/uL   Lymphocytes Relative 16 %   Lymphs Abs 1.7 0.7 - 4.0 K/uL   Monocytes Relative 6 %   Monocytes Absolute 0.7 0.1 - 1.0 K/uL   Eosinophils Relative 3 %   Eosinophils Absolute 0.3 0.0 - 0.5 K/uL   Basophils Relative 1 %   Basophils Absolute 0.1 0.0 - 0.1 K/uL   Immature Granulocytes 0 %   Abs Immature Granulocytes 0.04 0.00 - 0.07 K/uL    Comment: Performed at [facility], [address] [place], [place] [address]  CBG monitoring, ED     Status: Abnormal   Collection  Time: 03/14/21 11:54 PM  Result Value Ref Range   Glucose-Capillary 138 (H) 70 - 99 mg/dL    Comment: Glucose reference range applies only to samples taken after fasting for at least 8 hours.  CBC     Status: Abnormal   Collection Time: 03/15/21  2:39 AM  Result Value Ref Range   WBC 9.5 4.0 - 10.5 K/uL   RBC 4.83 3.87 - 5.11 MIL/uL   Hemoglobin 10.1 (L) 12.0 -  15.0 g/dL   HCT 97.0 (L) 26.3 - 78.5 %   MCV 69.8 (L) 80.0 - 100.0 fL   MCH 20.9 (L) 26.0 - 34.0 pg   MCHC 30.0 30.0 - 36.0 g/dL   RDW 88.5 (H) 02.7 - 74.1 %   Platelets 243 150 - 400 K/uL    Comment: REPEATED TO VERIFY   nRBC 0.0 0.0 - 0.2 %    Comment: Performed at [facility], [address], [place], [place] [address]  Comprehensive metabolic panel     Status: Abnormal   Collection Time: 03/15/21  2:39 AM  Result Value Ref Range   Sodium 137 135 - 145 mmol/L   Potassium 3.8 3.5 - 5.1 mmol/L   Chloride 107 98 - 111 mmol/L   CO2 21 (L) 22 - 32 mmol/L   Glucose, Bld 218 (H) 70 - 99 mg/dL    Comment: Glucose reference range applies only to samples taken after fasting for at least 8 hours.   BUN 10 6 - 20 mg/dL   Creatinine, Ser 7.67 0.44 - 1.00 mg/dL   Calcium 9.2 8.9 - 20.9 mg/dL   Total Protein 6.5 6.5 - 8.1 g/dL   Albumin 3.4 (L) 3.5 - 5.0 g/dL   AST 21 15 - 41 U/L   ALT 12 0 - 44 U/L   Alkaline Phosphatase 72 38 - 126 U/L   Total Bilirubin 0.4 0.3 - 1.2 mg/dL   GFR, Estimated >47 >09 mL/min    Comment: (NOTE) Calculated using the CKD-EPI Creatinine Equation (2021)    Anion gap 9 5 - 15    Comment: Performed at [facility], [address] [place], [place] [address]  CBG monitoring, ED     Status: Abnormal   Collection Time: 03/15/21  4:02 AM  Result Value Ref Range   Glucose-Capillary 262 (H) 70 - 99 mg/dL    Comment: Glucose reference range applies only to samples taken after fasting for at least 8 hours.  Urine rapid drug screen (hosp performed)     Status: Abnormal   Collection Time:  03/15/21  4:19 AM  Result Value Ref Range   Opiates POSITIVE (A) NONE DETECTED   Cocaine NONE DETECTED NONE DETECTED   Benzodiazepines NONE DETECTED NONE DETECTED   Amphetamines NONE DETECTED NONE DETECTED   Tetrahydrocannabinol NONE DETECTED NONE DETECTED   Barbiturates NONE DETECTED NONE DETECTED    Comment: (NOTE) DRUG SCREEN FOR MEDICAL PURPOSES ONLY.  IF CONFIRMATION IS NEEDED FOR ANY PURPOSE, NOTIFY LAB WITHIN 5 DAYS.  LOWEST DETECTABLE LIMITS FOR URINE DRUG SCREEN Drug Class                     Cutoff (ng/mL) Amphetamine and metabolites    1000 Barbiturate and metabolites    200 Benzodiazepine                 200 Tricyclics and metabolites     300 Opiates and metabolites        300 Cocaine and metabolites        300 THC                            50 Performed at [facility], [address] [place], [place] [address]   CBG monitoring, ED     Status: Abnormal   Collection Time: 03/15/21  7:28 AM  Result Value Ref Range   Glucose-Capillary  264 (H) 70 - 99 mg/dL    Comment: Glucose reference range applies only to samples taken after fasting for at least 8 hours.  CBG monitoring, ED     Status: Abnormal   Collection Time: 03/15/21 11:12 AM  Result Value Ref Range   Glucose-Capillary 262 (H) 70 - 99 mg/dL    Comment: Glucose reference range applies only to samples taken after fasting for at least 8 hours.  CBG monitoring, ED     Status: Abnormal   Collection Time: 03/15/21  1:40 PM  Result Value Ref Range   Glucose-Capillary 303 (H) 70 - 99 mg/dL    Comment: Glucose reference range applies only to samples taken after fasting for at least 8 hours.  Glucose, capillary     Status: Abnormal   Collection Time: 03/15/21  4:54 PM  Result Value Ref Range   Glucose-Capillary 357 (H) 70 - 99 mg/dL    Comment: Glucose reference range applies only to samples taken after fasting for at least 8 hours.  Glucose, capillary     Status: Abnormal   Collection Time: 03/15/21   8:43 PM  Result Value Ref Range   Glucose-Capillary 465 (H) 70 - 99 mg/dL    Comment: Glucose reference range applies only to samples taken after fasting for at least 8 hours.  Glucose, capillary     Status: Abnormal   Collection Time: 03/15/21  8:46 PM  Result Value Ref Range   Glucose-Capillary 415 (H) 70 - 99 mg/dL    Comment: Glucose reference range applies only to samples taken after fasting for at least 8 hours.  Glucose, capillary     Status: Abnormal   Collection Time: 03/15/21 11:28 PM  Result Value Ref Range   Glucose-Capillary 362 (H) 70 - 99 mg/dL    Comment: Glucose reference range applies only to samples taken after fasting for at least 8 hours.  SARS CORONAVIRUS 2 (TAT 6-24 HRS) Nasopharyngeal Nasopharyngeal Swab     Status: None   Collection Time: 03/16/21  3:52 AM   Specimen: Nasopharyngeal Swab  Result Value Ref Range   SARS Coronavirus 2 NEGATIVE NEGATIVE    Comment: (NOTE) SARS-CoV-2 target nucleic acids are NOT DETECTED.  The SARS-CoV-2 RNA is generally detectable in upper and lower respiratory specimens during the acute phase of infection. Negative results do not preclude SARS-CoV-2 infection, do not rule out co-infections with other pathogens, and should not be used as the sole basis for treatment or other patient management decisions. Negative results must be combined with clinical observations, patient history, and epidemiological information. The expected result is Negative.  Fact Sheet for Patients: HairSlick.nohttps://www.fda.gov/media/138098/download  Fact Sheet for Healthcare Providers: quierodirigir.comhttps://www.fda.gov/media/138095/download  This test is not yet approved or cleared by the [place] FDA and  has been authorized for detection and/or diagnosis of SARS-CoV-2 by FDA under an Emergency Use Authorization (EUA). This EUA will remain  in effect (meaning this test can be used) for the duration of the COVID-19 declaration under Se ction 564(b)(1) of the Act, 21  U.S.C. section 360bbb-3(b)(1), unless the authorization is terminated or revoked sooner.  Performed at [facility], [address] [place], [place] [address]   Glucose, capillary     Status: Abnormal   Collection Time: 03/16/21  4:37 AM  Result Value Ref Range   Glucose-Capillary 190 (H) 70 - 99 mg/dL    Comment: Glucose reference range applies only to samples taken after fasting for at least 8 hours.  Glucose,  capillary     Status: Abnormal   Collection Time: 03/16/21  8:36 AM  Result Value Ref Range   Glucose-Capillary 200 (H) 70 - 99 mg/dL    Comment: Glucose reference range applies only to samples taken after fasting for at least 8 hours.    Current Facility-Administered Medications  Medication Dose Route Frequency Provider Last Rate Last Admin  . enoxaparin (LOVENOX) injection 40 mg  40 mg Subcutaneous Q24H Lyda Perone M, DO   40 mg at 03/15/21 2038  . HYDROcodone-acetaminophen (NORCO/VICODIN) 5-325 MG per tablet 2 tablet  2 tablet Oral BID PRN Mansy, Vernetta Honey, MD   2 tablet at 03/16/21 [phone]  . insulin aspart (novoLOG) injection 0-15 Units  0-15 Units Subcutaneous Q4H Hillary Bow, DO   3 Units at 03/16/21 0932  . insulin aspart (novoLOG) injection 40 Units  40 Units Subcutaneous TID with meals Mansy, Vernetta Honey, MD   40 Units at 03/16/21 0931  . insulin glargine (LANTUS) injection 55 Units  55 Units Subcutaneous Daily Hillary Bow, DO   55 Units at 03/16/21 0932  . lactated ringers infusion   Intravenous Continuous Hillary Bow, DO 125 mL/hr at 03/15/21 2357 125 mL/hr at 03/15/21 2357  . LORazepam (ATIVAN) injection 2 mg  2 mg Intravenous Q10 min PRN Hillary Bow, DO   2 mg at 03/15/21 2035  . morphine 2 MG/ML injection 2 mg  2 mg Intravenous Q4H PRN Mansy, Jan A, MD      . nicotine (NICODERM CQ - dosed in mg/24 hours) patch 21 mg  21 mg Transdermal Q2200 Mansy, Jan A, MD   21 mg at 03/15/21 2136    Musculoskeletal: Strength & Muscle Tone: within normal  limits Gait & Station: normal Patient leans: N/A            Psychiatric Specialty Exam:  Presentation  General Appearance: Appropriate for Environment; Casual  Eye Contact:Good  Speech:Clear and Coherent; Normal Rate  Speech Volume:Normal  Handedness:Right   Mood and Affect  Mood:Euthymic  Affect:Appropriate; Congruent   Thought Process  Thought Processes:Coherent; Goal Directed  Descriptions of Associations:Intact  Orientation:Full (Time, Place and Person)  Thought Content:Logical; WDL  History of Schizophrenia/Schizoaffective disorder:No data recorded Duration of Psychotic Symptoms:No data recorded Hallucinations:Hallucinations: None  Ideas of Reference:None  Suicidal Thoughts:Suicidal Thoughts: No  Homicidal Thoughts:Homicidal Thoughts: No   Sensorium  Memory:Immediate Good; Recent Good; Remote Good  Judgment:Intact  Insight:Good   Executive Functions  Concentration:Good  Attention Span:Good  Recall:Good  Fund of Knowledge:Good  Language:Good   Psychomotor Activity  Psychomotor Activity:Psychomotor Activity: Normal   Assets  Assets:Desire for Improvement; Housing; Research scientist (medical); Resilience; Physical Health; Communication Skills; Leisure Time   Sleep  Sleep:Sleep: Poor   Physical Exam: Physical Exam Vitals and nursing note reviewed.  Psychiatric:        Mood and Affect: Mood normal.        Behavior: Behavior normal.        Thought Content: Thought content normal.        Judgment: Judgment normal.    Review of Systems  Neurological: Negative.   Psychiatric/Behavioral: Negative.   All other systems reviewed and are negative.  Blood pressure (!) 158/85, pulse 100, temperature 98.6 F (37 C), temperature source Oral, resp. rate 16, SpO2 100 %. There is no height or weight on file to calculate BMI.  Treatment Plan Summary: Plan Psych cleared at this time. Patient is taking Savella for management of her pain,  although  this medications acts as an antidpressant. She reports the medication is not working as it should, and has lead to development of Serotonin syndrome. Will recommend D/C of this medications. She can continue her Wellbutrin  po BID for mangement of depressive symptoms. Discussed in depth with patient polypharmacy and several medications she is taking are FDA approved for one condition, however can also be used to treat other conditions. She will benefit from tighter pain managemnt control and education from pahramacist on side effects of multiple medications. She appears to be very sensitive to most antidepressants and TCAs, will refrain from use of off label uses in the future.   -Recommend Hydroxyzine  po qhs for sleep.  -Discontinue Savella (started 3 weeks ago) -Psych cleared.  -Will benefit form pharmacy consult or education about medication and off label use.  Recommend tighter pain management control, and limit use of "off label" medications as this is her second unintentional overdose of prescribed medications.t.  Disposition: No evidence of imminent risk to self or others at present.   Patient does not meet criteria for psychiatric inpatient admission.  Maryagnes Amos, FNP 03/16/2021 11:38 AM

## 2021-03-21 NOTE — Telephone Encounter (Signed)
Error

## 2021-03-22 ENCOUNTER — Encounter: Payer: Medicaid Other | Admitting: Physical Medicine and Rehabilitation

## 2021-03-22 ENCOUNTER — Encounter: Payer: Self-pay | Admitting: Physical Medicine and Rehabilitation

## 2021-03-22 ENCOUNTER — Encounter
Payer: Medicaid Other | Attending: Physical Medicine and Rehabilitation | Admitting: Physical Medicine and Rehabilitation

## 2021-03-22 ENCOUNTER — Other Ambulatory Visit: Payer: Self-pay

## 2021-03-22 DIAGNOSIS — G894 Chronic pain syndrome: Secondary | ICD-10-CM | POA: Insufficient documentation

## 2021-03-22 DIAGNOSIS — G629 Polyneuropathy, unspecified: Secondary | ICD-10-CM | POA: Insufficient documentation

## 2021-03-22 DIAGNOSIS — Z794 Long term (current) use of insulin: Secondary | ICD-10-CM

## 2021-03-22 DIAGNOSIS — Z5181 Encounter for therapeutic drug level monitoring: Secondary | ICD-10-CM | POA: Insufficient documentation

## 2021-03-22 DIAGNOSIS — M797 Fibromyalgia: Secondary | ICD-10-CM | POA: Insufficient documentation

## 2021-03-22 DIAGNOSIS — M170 Bilateral primary osteoarthritis of knee: Secondary | ICD-10-CM | POA: Diagnosis not present

## 2021-03-22 DIAGNOSIS — M5416 Radiculopathy, lumbar region: Secondary | ICD-10-CM | POA: Insufficient documentation

## 2021-03-22 DIAGNOSIS — G4701 Insomnia due to medical condition: Secondary | ICD-10-CM | POA: Insufficient documentation

## 2021-03-22 DIAGNOSIS — E119 Type 2 diabetes mellitus without complications: Secondary | ICD-10-CM | POA: Insufficient documentation

## 2021-03-22 DIAGNOSIS — Z79899 Other long term (current) drug therapy: Secondary | ICD-10-CM | POA: Insufficient documentation

## 2021-03-22 DIAGNOSIS — R000 Tachycardia, unspecified: Secondary | ICD-10-CM | POA: Insufficient documentation

## 2021-03-22 NOTE — Progress Notes (Signed)
Subjective:    Patient ID: Jill Shaw, female    DOB: September 07, 1976, 45 y.o.   MRN: [id number]  Due to national recommendations of social distancing because of COVID 61, an audio/video tele-health visit is felt to be the most appropriate encounter for this patient at this time. See MyChart message from today for the patient's consent to a tele-health encounter with [facility]. This is a follow up tele-visit via phone. The patient is at home. MD is at office.   HPI: Jill Shaw is a 45 y.o. female who returns for f/u appointment for chronic pain, diabetic peripheral neuropathy, and insomnia. She states her pain is located in her lower back radiating into her bilateral lower extremities and bilateral knee pain. She denies falling, we will order X-rays, she verbalizes understanding. She rates her pain 9. Her current exercise regime is walking.   Jill Shaw Morphine equivalent is 20.00 MME.  Last Oral Swab was Performed on 01/31/2021, it was consistent.   She continues to experience insomnia at night. Asks about Ambien and I discussed that this is an addictive medication and there are other safer options we can try first that can also help with her pain.   She was hospitalized for serotonin syndrome after use of Savella and Cymbalta.   She is currently not taking either of these medications, or Amitriptyline or Trazodone.   She takes Requip for resltless legs but this does not help.  She is still sleeping very poorly  She does use screens before bed time   Pain Inventory Average Pain 9 Pain Right Now 9 My pain is sharp, burning, tingling and aching  In the last 24 hours, has pain interfered with the following? General activity 0 Relation with others 0 Enjoyment of life 5 What TIME of day is your pain at its worst? morning , daytime, evening and night Sleep (in general) Poor  Pain is worse with: walking, bending, sitting, inactivity, standing  and some activites Pain improves with: heat/ice and medication Relief from Meds: 10  Family History  Problem Relation Age of Onset  . Diabetes Mother   . Hypertension Mother    Social History   Socioeconomic History  . Marital status: Legally Separated    Spouse name: Not on file  . Number of children: 3  . Years of education: Not on file  . Highest education level: Not on file  Occupational History  . Occupation: unemployed  Tobacco Use  . Smoking status: Former Smoker    Packs/day: 0.25    Years: 26.00    Pack years: 6.50    Types: Cigarettes    Quit date: 09/13/2020    Years since quitting: 0.5  . Smokeless tobacco: Never Used  . Tobacco comment: 4-5 cigarettes/day  Vaping Use  . Vaping Use: Never used  Substance and Sexual Activity  . Alcohol use: No  . Drug use: No  . Sexual activity: Not Currently    Birth control/protection: None  Other Topics Concern  . Not on file  Social History Narrative   Right Handed   Lives in a one story apartment, but lives on the second floor   Drinks caffeine once in awhile   Social Determinants of Health   Financial Resource Strain: Not on file  Food Insecurity: No Food Insecurity  . Worried About Programme researcher, broadcasting/film/video in the Last Year: Never true  . Ran Out of Food in the Last Year: Never true  Transportation Needs: No Transportation Needs  . Lack of Transportation (Medical): No  . Lack of Transportation (Non-Medical): No  Physical Activity: Not on file  Stress: Not on file  Social Connections: Not on file   Past Surgical History:  Procedure Laterality Date  . CESAREAN SECTION     x 1. for twins  . DILATION AND CURETTAGE OF UTERUS N/A 08/20/2019   Procedure: DILATATION AND CURETTAGE;  Surgeon: Allie Bossier, MD;  Location: [facility] OR;  Service: Gynecology;  Laterality: N/A;  . ENDOMETRIAL ABLATION N/A 08/20/2019   Procedure: Minerva Ablation;  Surgeon: Allie Bossier, MD;  Location: [facility] OR;  Service: Gynecology;  Laterality: N/A;   . EYE SURGERY Bilateral    laser right and cataract removed left eye  . RADIOLOGY WITH ANESTHESIA N/A 09/16/2019   Procedure: MRI WITH ANESTHESIA   L SPINE WITHOUT CONTRAST, T SPINE WITHOUT CONTRAST , CERVICAL WITHOUT CONTRAST;  Surgeon: Radiologist, Medication, MD;  Location: [facility] OR;  Service: Radiology;  Laterality: N/A;  . TUBAL LIGATION     interval BTL  . UPPER GI ENDOSCOPY  07/2017   Past Surgical History:  Procedure Laterality Date  . CESAREAN SECTION     x 1. for twins  . DILATION AND CURETTAGE OF UTERUS N/A 08/20/2019   Procedure: DILATATION AND CURETTAGE;  Surgeon: Allie Bossier, MD;  Location: [facility] OR;  Service: Gynecology;  Laterality: N/A;  . ENDOMETRIAL ABLATION N/A 08/20/2019   Procedure: Minerva Ablation;  Surgeon: Allie Bossier, MD;  Location: [facility] OR;  Service: Gynecology;  Laterality: N/A;  . EYE SURGERY Bilateral    laser right and cataract removed left eye  . RADIOLOGY WITH ANESTHESIA N/A 09/16/2019   Procedure: MRI WITH ANESTHESIA   L SPINE WITHOUT CONTRAST, T SPINE WITHOUT CONTRAST , CERVICAL WITHOUT CONTRAST;  Surgeon: Radiologist, Medication, MD;  Location: [facility] OR;  Service: Radiology;  Laterality: N/A;  . TUBAL LIGATION     interval BTL  . UPPER GI ENDOSCOPY  07/2017   Past Medical History:  Diagnosis Date  . Anemia   . Arthritis    knees, hands  . Asthma   . Chronic diastolic (congestive) heart failure (HCC)   . COPD (chronic obstructive pulmonary disease) (HCC)   . Diabetes mellitus without complication (HCC)    type 2  . Dysfunctional uterine bleeding   . GERD (gastroesophageal reflux disease)   . Hypertension   . Neuromuscular disorder (HCC)    neuropathy feet  . Seizures (HCC) 09/12/2017   pt states r/t stress and blood sugar - no meds last one 4 months ago, not seen neurologist  . Sickle cell trait (HCC)   . Smoker   . Vitamin D deficiency 10/2019  . Wears glasses    There were no vitals taken for this visit.  Opioid Risk Score:   Fall Risk  Score:  `1  Depression screen PHQ 2/9  Depression screen [facility] 2/9 03/22/2021 01/31/2021 01/20/2021 01/07/2021 10/27/2020 10/13/2020 09/02/2020  Decreased Interest 1 1 0 0 0 1 2  Down, Depressed, Hopeless 1 1 0 0 0 0 2  PHQ - 2 Score 2 2 0 0 0 1 4  Altered sleeping - - 3 - 3 3 2   Tired, decreased energy - - 3 - 2 3 2   Change in appetite - - 0 - 0 0 0  Feeling bad or failure about yourself  - - 0 - 0 0 0  Trouble concentrating - - 1 - 0 2 0  Moving slowly or fidgety/restless - - 0 - 0 0 0  Suicidal thoughts - - 0 - 0 0 0  PHQ-9 Score - - 7 - 5 9 8   Difficult doing work/chores - - - - - Very difficult Somewhat difficult  Some recent data might be hidden     Review of Systems  Constitutional: Positive for diaphoresis and unexpected weight change.  Respiratory: Positive for shortness of breath and wheezing.   Gastrointestinal: Positive for abdominal pain, constipation and nausea.  Musculoskeletal: Positive for arthralgias, back pain, gait problem and myalgias.  Neurological: Positive for dizziness, weakness and numbness.  Hematological: Bruises/bleeds easily.  Psychiatric/Behavioral: Positive for confusion and decreased concentration. The patient is nervous/anxious.   All other systems reviewed and are negative.      Objective:  Not performed as patient was seen via phone visit.         Assessment & Plan:  1. Lumbar Radiculitis: Continue current medication regimen. Continue HEP as Tolerated.  2. Fibromyalgia: Continue HEP as Tolerated. Continue current Medication regimen. Continue to Monitor.  3. Bilateral Knee Pain:RX: Bilateral Knee X-rays. Continue to Monitor.  4. Chronic Pain Syndrome: Continue Hydrocodone 10/325mg  one tablet twice a day as needed for pain #60. We will continue the opioid monitoring program, this consists of regular clinic visits, examinations, urine drug screen, pill counts as well as use of Controlled Substance Reporting system. A 12 month History  has been reviewed on the [place] Controlled Substance Reporting System on 02/23/2021. 5. Insomnia: -Try to go outside near sunrise -Get exercise during the day.  -Discussed good sleep hygiene: turning off all devices an hour before bedtime.  -Chamomile tea with dinner.  -Melatonin did for help. -warm bath or shower before bed. -apply lavender oil for forehead at night 6. Diabetic peripheral neuropathy -Discussed Qutenza as an option for neuropathic pain control. Discussed that this is a capsaicin patch, stronger than capsaicin cream. Discussed that it is currently approved for diabetic peripheral neuropathy and post-herpetic neuralgia, but that it has also shown benefit in treating other forms of neuropathy. Provided patient with link to site to learn more about the patch: 04/25/2021. Discussed that the patch would be placed in office and benefits usually last 3 months. Discussed that unintended exposure to capsaicin can cause severe irritation of eyes, mucous membranes, respiratory tract, and skin, but that Qutenza is a local treatment and does not have the systemic side effects of other nerve medications. Discussed that there may be pain, itching, erythema, and decreased sensory function associated with the application of Qutenza. Side effects usually subside within 1 week. A cold pack of analgesic medications can help with these side effects. Blood pressure can also be increased due to pain associated with administration of the patch.  F/U with https://[employer]/ within 1 month   20 minutes spent in discussion of her recent hospitalization for serotonin syndrome, avoiding medications that can cause this interaction in the future, review of current medications, conservative sleep hygiene strategies to help minimize her insomnia, bilateral knee XRs to assess for OA

## 2021-03-23 ENCOUNTER — Emergency Department (HOSPITAL_COMMUNITY): Payer: Medicaid Other

## 2021-03-23 ENCOUNTER — Inpatient Hospital Stay (HOSPITAL_COMMUNITY): Payer: Medicaid Other

## 2021-03-23 ENCOUNTER — Inpatient Hospital Stay (HOSPITAL_COMMUNITY)
Admission: EM | Admit: 2021-03-23 | Discharge: 2021-03-24 | DRG: 638 | Disposition: A | Discharge status: Home / Self Care | Payer: Medicaid Other | Attending: Internal Medicine | Admitting: Internal Medicine

## 2021-03-23 DIAGNOSIS — E1110 Type 2 diabetes mellitus with ketoacidosis without coma: Principal | ICD-10-CM | POA: Diagnosis present

## 2021-03-23 DIAGNOSIS — E559 Vitamin D deficiency, unspecified: Secondary | ICD-10-CM | POA: Diagnosis not present

## 2021-03-23 DIAGNOSIS — E1165 Type 2 diabetes mellitus with hyperglycemia: Secondary | ICD-10-CM | POA: Diagnosis not present

## 2021-03-23 DIAGNOSIS — R112 Nausea with vomiting, unspecified: Secondary | ICD-10-CM

## 2021-03-23 DIAGNOSIS — D638 Anemia in other chronic diseases classified elsewhere: Secondary | ICD-10-CM | POA: Diagnosis not present

## 2021-03-23 DIAGNOSIS — E1143 Type 2 diabetes mellitus with diabetic autonomic (poly)neuropathy: Secondary | ICD-10-CM | POA: Diagnosis present

## 2021-03-23 DIAGNOSIS — Z8249 Family history of ischemic heart disease and other diseases of the circulatory system: Secondary | ICD-10-CM

## 2021-03-23 DIAGNOSIS — Z833 Family history of diabetes mellitus: Secondary | ICD-10-CM

## 2021-03-23 DIAGNOSIS — J449 Chronic obstructive pulmonary disease, unspecified: Secondary | ICD-10-CM | POA: Diagnosis present

## 2021-03-23 DIAGNOSIS — E785 Hyperlipidemia, unspecified: Secondary | ICD-10-CM | POA: Diagnosis present

## 2021-03-23 DIAGNOSIS — E1122 Type 2 diabetes mellitus with diabetic chronic kidney disease: Secondary | ICD-10-CM | POA: Diagnosis not present

## 2021-03-23 DIAGNOSIS — I130 Hypertensive heart and chronic kidney disease with heart failure and stage 1 through stage 4 chronic kidney disease, or unspecified chronic kidney disease: Secondary | ICD-10-CM | POA: Diagnosis present

## 2021-03-23 DIAGNOSIS — K219 Gastro-esophageal reflux disease without esophagitis: Secondary | ICD-10-CM | POA: Diagnosis not present

## 2021-03-23 DIAGNOSIS — I5032 Chronic diastolic (congestive) heart failure: Secondary | ICD-10-CM | POA: Diagnosis not present

## 2021-03-23 DIAGNOSIS — D573 Sickle-cell trait: Secondary | ICD-10-CM | POA: Diagnosis present

## 2021-03-23 DIAGNOSIS — Z794 Long term (current) use of insulin: Secondary | ICD-10-CM

## 2021-03-23 DIAGNOSIS — N182 Chronic kidney disease, stage 2 (mild): Secondary | ICD-10-CM | POA: Diagnosis present

## 2021-03-23 DIAGNOSIS — E860 Dehydration: Secondary | ICD-10-CM | POA: Diagnosis present

## 2021-03-23 DIAGNOSIS — E119 Type 2 diabetes mellitus without complications: Secondary | ICD-10-CM

## 2021-03-23 DIAGNOSIS — N179 Acute kidney failure, unspecified: Secondary | ICD-10-CM | POA: Diagnosis not present

## 2021-03-23 DIAGNOSIS — R079 Chest pain, unspecified: Secondary | ICD-10-CM | POA: Diagnosis not present

## 2021-03-23 DIAGNOSIS — Z87891 Personal history of nicotine dependence: Secondary | ICD-10-CM

## 2021-03-23 DIAGNOSIS — R0689 Other abnormalities of breathing: Secondary | ICD-10-CM | POA: Diagnosis not present

## 2021-03-23 DIAGNOSIS — E872 Acidosis, unspecified: Secondary | ICD-10-CM | POA: Diagnosis present

## 2021-03-23 DIAGNOSIS — G8929 Other chronic pain: Secondary | ICD-10-CM | POA: Diagnosis present

## 2021-03-23 DIAGNOSIS — M25562 Pain in left knee: Secondary | ICD-10-CM

## 2021-03-23 DIAGNOSIS — F321 Major depressive disorder, single episode, moderate: Secondary | ICD-10-CM | POA: Diagnosis present

## 2021-03-23 DIAGNOSIS — Z7951 Long term (current) use of inhaled steroids: Secondary | ICD-10-CM

## 2021-03-23 DIAGNOSIS — Z20822 Contact with and (suspected) exposure to covid-19: Secondary | ICD-10-CM | POA: Diagnosis not present

## 2021-03-23 DIAGNOSIS — R1084 Generalized abdominal pain: Secondary | ICD-10-CM | POA: Diagnosis present

## 2021-03-23 DIAGNOSIS — R109 Unspecified abdominal pain: Secondary | ICD-10-CM | POA: Diagnosis present

## 2021-03-23 DIAGNOSIS — E871 Hypo-osmolality and hyponatremia: Secondary | ICD-10-CM | POA: Diagnosis present

## 2021-03-23 DIAGNOSIS — R0789 Other chest pain: Secondary | ICD-10-CM | POA: Diagnosis not present

## 2021-03-23 DIAGNOSIS — R000 Tachycardia, unspecified: Secondary | ICD-10-CM | POA: Diagnosis not present

## 2021-03-23 DIAGNOSIS — Z6841 Body Mass Index (BMI) 40.0 and over, adult: Secondary | ICD-10-CM | POA: Diagnosis not present

## 2021-03-23 DIAGNOSIS — E1129 Type 2 diabetes mellitus with other diabetic kidney complication: Secondary | ICD-10-CM | POA: Diagnosis not present

## 2021-03-23 DIAGNOSIS — D631 Anemia in chronic kidney disease: Secondary | ICD-10-CM | POA: Diagnosis present

## 2021-03-23 DIAGNOSIS — K3184 Gastroparesis: Secondary | ICD-10-CM

## 2021-03-23 DIAGNOSIS — Z79899 Other long term (current) drug therapy: Secondary | ICD-10-CM

## 2021-03-23 DIAGNOSIS — IMO0002 Reserved for concepts with insufficient information to code with codable children: Secondary | ICD-10-CM

## 2021-03-23 DIAGNOSIS — I10 Essential (primary) hypertension: Secondary | ICD-10-CM | POA: Diagnosis present

## 2021-03-23 DIAGNOSIS — R739 Hyperglycemia, unspecified: Secondary | ICD-10-CM

## 2021-03-23 DIAGNOSIS — R569 Unspecified convulsions: Secondary | ICD-10-CM

## 2021-03-23 DIAGNOSIS — K7689 Other specified diseases of liver: Secondary | ICD-10-CM | POA: Diagnosis not present

## 2021-03-23 DIAGNOSIS — E66813 Obesity, class 3: Secondary | ICD-10-CM | POA: Diagnosis present

## 2021-03-23 DIAGNOSIS — F17200 Nicotine dependence, unspecified, uncomplicated: Secondary | ICD-10-CM | POA: Diagnosis present

## 2021-03-23 DIAGNOSIS — K5900 Constipation, unspecified: Secondary | ICD-10-CM | POA: Diagnosis not present

## 2021-03-23 DIAGNOSIS — M25561 Pain in right knee: Secondary | ICD-10-CM

## 2021-03-23 HISTORY — DX: Nausea with vomiting, unspecified: R11.2

## 2021-03-23 LAB — COMPREHENSIVE METABOLIC PANEL
ALT: 15 U/L (ref 0–44)
AST: 38 U/L (ref 15–41)
Albumin: 4.1 g/dL (ref 3.5–5.0)
Alkaline Phosphatase: 98 U/L (ref 38–126)
Anion gap: 19 — ABNORMAL HIGH (ref 5–15)
BUN: 19 mg/dL (ref 6–20)
CO2: 17 mmol/L — ABNORMAL LOW (ref 22–32)
Calcium: 9.6 mg/dL (ref 8.9–10.3)
Chloride: 95 mmol/L — ABNORMAL LOW (ref 98–111)
Creatinine, Ser: 1.77 mg/dL — ABNORMAL HIGH (ref 0.44–1.00)
GFR, Estimated: 36 mL/min — ABNORMAL LOW (ref >60)
Glucose, Bld: 373 mg/dL — ABNORMAL HIGH (ref 70–99)
Potassium: 4.6 mmol/L (ref 3.5–5.1)
Sodium: 131 mmol/L — ABNORMAL LOW (ref 135–145)
Total Bilirubin: 1 mg/dL (ref 0.3–1.2)
Total Protein: 7.9 g/dL (ref 6.5–8.1)

## 2021-03-23 LAB — CBC WITH DIFFERENTIAL/PLATELET
Abs Immature Granulocytes: 0.05 10*3/uL (ref 0.00–0.07)
Basophils Absolute: 0.1 10*3/uL (ref 0.0–0.1)
Basophils Relative: 0 %
Eosinophils Absolute: 0.2 10*3/uL (ref 0.0–0.5)
Eosinophils Relative: 1 %
HCT: 36.8 % (ref 36.0–46.0)
Hemoglobin: 11.3 g/dL — ABNORMAL LOW (ref 12.0–15.0)
Immature Granulocytes: 0 %
Lymphocytes Relative: 19 %
Lymphs Abs: 2.6 10*3/uL (ref 0.7–4.0)
MCH: 20.4 pg — ABNORMAL LOW (ref 26.0–34.0)
MCHC: 30.7 g/dL (ref 30.0–36.0)
MCV: 66.5 fL — ABNORMAL LOW (ref 80.0–100.0)
Monocytes Absolute: 0.9 10*3/uL (ref 0.1–1.0)
Monocytes Relative: 6 %
Neutro Abs: 9.8 10*3/uL — ABNORMAL HIGH (ref 1.7–7.7)
Neutrophils Relative %: 74 %
Platelets: 352 10*3/uL (ref 150–400)
RBC: 5.53 MIL/uL — ABNORMAL HIGH (ref 3.87–5.11)
RDW: 24.7 % — ABNORMAL HIGH (ref 11.5–15.5)
WBC: 13.6 10*3/uL — ABNORMAL HIGH (ref 4.0–10.5)
nRBC: 0 % (ref 0.0–0.2)

## 2021-03-23 LAB — URINALYSIS, ROUTINE W REFLEX MICROSCOPIC
Bilirubin Urine: NEGATIVE
Glucose, UA: >=500 mg/dL — AB
Hgb urine dipstick: NEGATIVE
Ketones, ur: 5 mg/dL — AB
Leukocytes,Ua: NEGATIVE
Nitrite: NEGATIVE
Protein, ur: NEGATIVE mg/dL
Specific Gravity, Urine: 1.026 (ref 1.005–1.030)
pH: 5 (ref 5.0–8.0)

## 2021-03-23 LAB — RAPID URINE DRUG SCREEN, HOSP PERFORMED
Amphetamines: NOT DETECTED
Barbiturates: NOT DETECTED
Benzodiazepines: NOT DETECTED
Cocaine: NOT DETECTED
Opiates: POSITIVE — AB
Tetrahydrocannabinol: NOT DETECTED

## 2021-03-23 LAB — PREGNANCY, URINE: Preg Test, Ur: NEGATIVE

## 2021-03-23 LAB — SEDIMENTATION RATE: Sed Rate: 25 mm/hr — ABNORMAL HIGH (ref 0–22)

## 2021-03-23 LAB — RESP PANEL BY RT-PCR (FLU A&B, COVID) ARPGX2
Influenza A by PCR: NEGATIVE
Influenza B by PCR: NEGATIVE
SARS Coronavirus 2 by RT PCR: NEGATIVE

## 2021-03-23 LAB — BASIC METABOLIC PANEL
Anion gap: 13 (ref 5–15)
Anion gap: 11 (ref 5–15)
BUN: 19 mg/dL (ref 6–20)
BUN: 18 mg/dL (ref 6–20)
CO2: 21 mmol/L — ABNORMAL LOW (ref 22–32)
CO2: 23 mmol/L (ref 22–32)
Calcium: 9.4 mg/dL (ref 8.9–10.3)
Calcium: 9.3 mg/dL (ref 8.9–10.3)
Chloride: 99 mmol/L (ref 98–111)
Chloride: 99 mmol/L (ref 98–111)
Creatinine, Ser: 1.49 mg/dL — ABNORMAL HIGH (ref 0.44–1.00)
Creatinine, Ser: 1.48 mg/dL — ABNORMAL HIGH (ref 0.44–1.00)
GFR, Estimated: 44 mL/min — ABNORMAL LOW (ref >60)
GFR, Estimated: 45 mL/min — ABNORMAL LOW (ref >60)
Glucose, Bld: 371 mg/dL — ABNORMAL HIGH (ref 70–99)
Glucose, Bld: 299 mg/dL — ABNORMAL HIGH (ref 70–99)
Potassium: 5.4 mmol/L — ABNORMAL HIGH (ref 3.5–5.1)
Potassium: 4.4 mmol/L (ref 3.5–5.1)
Sodium: 133 mmol/L — ABNORMAL LOW (ref 135–145)
Sodium: 133 mmol/L — ABNORMAL LOW (ref 135–145)

## 2021-03-23 LAB — BETA-HYDROXYBUTYRIC ACID
Beta-Hydroxybutyric Acid: 1.79 mmol/L — ABNORMAL HIGH (ref 0.05–0.27)
Beta-Hydroxybutyric Acid: 0.38 mmol/L — ABNORMAL HIGH (ref 0.05–0.27)

## 2021-03-23 LAB — LIPASE, BLOOD: Lipase: 26 U/L (ref 11–51)

## 2021-03-23 LAB — LACTIC ACID, PLASMA
Lactic Acid, Venous: 2.2 mmol/L (ref 0.5–1.9)
Lactic Acid, Venous: 2 mmol/L (ref 0.5–1.9)

## 2021-03-23 LAB — GLUCOSE, CAPILLARY: Glucose-Capillary: 318 mg/dL — ABNORMAL HIGH (ref 70–99)

## 2021-03-23 LAB — CBG MONITORING, ED
Glucose-Capillary: 335 mg/dL — ABNORMAL HIGH (ref 70–99)
Glucose-Capillary: 277 mg/dL — ABNORMAL HIGH (ref 70–99)
Glucose-Capillary: 251 mg/dL — ABNORMAL HIGH (ref 70–99)

## 2021-03-23 LAB — URIC ACID: Uric Acid, Serum: 7.3 mg/dL — ABNORMAL HIGH (ref 2.5–7.1)

## 2021-03-23 LAB — TROPONIN I (HIGH SENSITIVITY)
Troponin I (High Sensitivity): 7 ng/L (ref <18)
Troponin I (High Sensitivity): 9 ng/L (ref <18)

## 2021-03-23 LAB — C-REACTIVE PROTEIN: CRP: 1 mg/dL — ABNORMAL HIGH (ref <1.0)

## 2021-03-23 MED ORDER — DILTIAZEM HCL ER COATED BEADS 180 MG PO CP24
180.0000 mg | ORAL_CAPSULE | Freq: Every day | ORAL | Status: DC
  Administered 2021-03-24: 180 mg via ORAL
  Filled 2021-03-23: qty 1

## 2021-03-23 MED ORDER — LINAGLIPTIN 5 MG PO TABS
5.0000 mg | ORAL_TABLET | Freq: Every day | ORAL | Status: DC
  Administered 2021-03-24: 5 mg via ORAL
  Filled 2021-03-23: qty 1

## 2021-03-23 MED ORDER — INSULIN GLARGINE 100 UNIT/ML ~~LOC~~ SOLN
50.0000 [IU] | Freq: Two times a day (BID) | SUBCUTANEOUS | Status: DC
  Administered 2021-03-23: 50 [IU] via SUBCUTANEOUS
  Filled 2021-03-23 (×4): qty 0.5

## 2021-03-23 MED ORDER — INSULIN GLARGINE 100 UNIT/ML ~~LOC~~ SOLN
50.0000 [IU] | Freq: Two times a day (BID) | SUBCUTANEOUS | Status: DC
  Filled 2021-03-23: qty 0.5

## 2021-03-23 MED ORDER — HYDROMORPHONE HCL 1 MG/ML IJ SOLN
1.0000 mg | Freq: Once | INTRAMUSCULAR | Status: AC
Start: 2021-03-23 — End: 2021-03-23
  Administered 2021-03-23: 1 mg via INTRAVENOUS
  Filled 2021-03-23: qty 1

## 2021-03-23 MED ORDER — ALBUTEROL SULFATE HFA 108 (90 BASE) MCG/ACT IN AERS: 2.0000 | INHALATION_SPRAY | Freq: Four times a day (QID) | RESPIRATORY_TRACT | Status: DC | PRN

## 2021-03-23 MED ORDER — METOPROLOL SUCCINATE ER 100 MG PO TB24
100.0000 mg | ORAL_TABLET | Freq: Every day | ORAL | Status: DC
  Administered 2021-03-24: 100 mg via ORAL
  Filled 2021-03-23: qty 1

## 2021-03-23 MED ORDER — LORATADINE 10 MG PO TABS
10.0000 mg | ORAL_TABLET | Freq: Every day | ORAL | Status: DC
  Administered 2021-03-24: 10 mg via ORAL
  Filled 2021-03-23: qty 1

## 2021-03-23 MED ORDER — MELATONIN 3 MG PO TABS: 3.0000 mg | ORAL_TABLET | Freq: Every evening | ORAL | Status: DC | PRN

## 2021-03-23 MED ORDER — POTASSIUM CHLORIDE 10 MEQ/100ML IV SOLN
10.0000 meq | INTRAVENOUS | Status: AC
  Administered 2021-03-23 (×2): 10 meq via INTRAVENOUS
  Filled 2021-03-23: qty 100

## 2021-03-23 MED ORDER — FENTANYL CITRATE (PF) 100 MCG/2ML IJ SOLN
50.0000 ug | Freq: Once | INTRAMUSCULAR | Status: AC
  Administered 2021-03-23: 50 ug via INTRAVENOUS
  Filled 2021-03-23: qty 2

## 2021-03-23 MED ORDER — LACTATED RINGERS IV SOLN: INTRAVENOUS | Status: DC

## 2021-03-23 MED ORDER — ONDANSETRON HCL 4 MG/2ML IJ SOLN
2.0000 mg | Freq: Once | INTRAMUSCULAR | Status: AC
  Administered 2021-03-23: 2 mg via INTRAVENOUS
  Filled 2021-03-23: qty 2

## 2021-03-23 MED ORDER — FERROUS SULFATE 325 (65 FE) MG PO TABS
325.0000 mg | ORAL_TABLET | Freq: Three times a day (TID) | ORAL | Status: DC
  Administered 2021-03-24 (×3): 325 mg via ORAL
  Filled 2021-03-23 (×3): qty 1

## 2021-03-23 MED ORDER — LACTATED RINGERS IV BOLUS
20.0000 mL/kg | Freq: Once | INTRAVENOUS | Status: AC
  Administered 2021-03-23: 1000 mL via INTRAVENOUS

## 2021-03-23 MED ORDER — PANTOPRAZOLE SODIUM 40 MG PO TBEC
40.0000 mg | DELAYED_RELEASE_TABLET | Freq: Every day | ORAL | Status: DC
  Administered 2021-03-24: 40 mg via ORAL
  Filled 2021-03-23: qty 1

## 2021-03-23 MED ORDER — FLUTICASONE PROPIONATE 50 MCG/ACT NA SUSP
2.0000 | Freq: Every day | NASAL | Status: DC
  Administered 2021-03-24: 2 via NASAL
  Filled 2021-03-23: qty 16

## 2021-03-23 MED ORDER — SENNOSIDES-DOCUSATE SODIUM 8.6-50 MG PO TABS: 1.0000 | ORAL_TABLET | Freq: Every evening | ORAL | Status: DC | PRN

## 2021-03-23 MED ORDER — DEXTROSE 50 % IV SOLN: 0.0000 mL | INTRAVENOUS | Status: DC | PRN

## 2021-03-23 MED ORDER — SODIUM CHLORIDE 0.9 % IV BOLUS
1000.0000 mL | Freq: Once | INTRAVENOUS | Status: AC
  Administered 2021-03-23: 1000 mL via INTRAVENOUS

## 2021-03-23 MED ORDER — POTASSIUM CHLORIDE CRYS ER 20 MEQ PO TBCR
20.0000 meq | EXTENDED_RELEASE_TABLET | Freq: Every day | ORAL | Status: DC
  Administered 2021-03-24: 20 meq via ORAL
  Filled 2021-03-23: qty 1

## 2021-03-23 MED ORDER — ONDANSETRON HCL 4 MG/2ML IJ SOLN: 4.0000 mg | Freq: Once | INTRAMUSCULAR | Status: DC

## 2021-03-23 MED ORDER — ONDANSETRON HCL 4 MG PO TABS: 4.0000 mg | ORAL_TABLET | Freq: Four times a day (QID) | ORAL | Status: DC | PRN

## 2021-03-23 MED ORDER — ROSUVASTATIN CALCIUM 5 MG PO TABS
5.0000 mg | ORAL_TABLET | Freq: Every day | ORAL | Status: DC
  Administered 2021-03-24: 5 mg via ORAL
  Filled 2021-03-23: qty 1

## 2021-03-23 MED ORDER — INSULIN ASPART 100 UNIT/ML IJ SOLN
40.0000 [IU] | Freq: Three times a day (TID) | INTRAMUSCULAR | Status: DC
  Administered 2021-03-24 (×2): 40 [IU] via SUBCUTANEOUS

## 2021-03-23 MED ORDER — INSULIN ASPART 100 UNIT/ML IJ SOLN
0.0000 [IU] | Freq: Three times a day (TID) | INTRAMUSCULAR | Status: DC
  Administered 2021-03-23: 11 [IU] via SUBCUTANEOUS
  Administered 2021-03-24: 3 [IU] via SUBCUTANEOUS
  Administered 2021-03-24: 11 [IU] via SUBCUTANEOUS
  Administered 2021-03-24: 7 [IU] via SUBCUTANEOUS

## 2021-03-23 MED ORDER — TOPIRAMATE 25 MG PO TABS
25.0000 mg | ORAL_TABLET | Freq: Two times a day (BID) | ORAL | Status: DC
  Administered 2021-03-23 – 2021-03-24 (×3): 25 mg via ORAL
  Filled 2021-03-23 (×3): qty 1

## 2021-03-23 MED ORDER — UMECLIDINIUM BROMIDE 62.5 MCG/INH IN AEPB
1.0000 | INHALATION_SPRAY | Freq: Every day | RESPIRATORY_TRACT | Status: DC
  Administered 2021-03-24: 1 via RESPIRATORY_TRACT
  Filled 2021-03-23: qty 7

## 2021-03-23 MED ORDER — HYDROCODONE-ACETAMINOPHEN 5-325 MG PO TABS
1.0000 | ORAL_TABLET | ORAL | Status: DC | PRN
  Administered 2021-03-24: 2 via ORAL
  Filled 2021-03-23: qty 2

## 2021-03-23 MED ORDER — HYDROMORPHONE HCL 1 MG/ML IJ SOLN
0.5000 mg | Freq: Once | INTRAMUSCULAR | Status: AC
  Administered 2021-03-23: 0.5 mg via INTRAVENOUS
  Filled 2021-03-23: qty 1

## 2021-03-23 MED ORDER — MOMETASONE FURO-FORMOTEROL FUM 200-5 MCG/ACT IN AERO
2.0000 | INHALATION_SPRAY | Freq: Two times a day (BID) | RESPIRATORY_TRACT | Status: DC
  Administered 2021-03-24 (×2): 2 via RESPIRATORY_TRACT
  Filled 2021-03-23: qty 8.8

## 2021-03-23 MED ORDER — MORPHINE SULFATE (PF) 2 MG/ML IV SOLN
2.0000 mg | INTRAVENOUS | Status: DC | PRN
  Administered 2021-03-23 – 2021-03-24 (×3): 2 mg via INTRAVENOUS
  Filled 2021-03-23 (×3): qty 1

## 2021-03-23 MED ORDER — ENOXAPARIN SODIUM 40 MG/0.4ML IJ SOSY
40.0000 mg | PREFILLED_SYRINGE | INTRAMUSCULAR | Status: DC
  Administered 2021-03-23: 40 mg via SUBCUTANEOUS
  Filled 2021-03-23: qty 0.4

## 2021-03-23 MED ORDER — DEXTROSE IN LACTATED RINGERS 5 % IV SOLN: INTRAVENOUS | Status: DC

## 2021-03-23 MED ORDER — ONDANSETRON HCL 4 MG/2ML IJ SOLN: 4.0000 mg | Freq: Four times a day (QID) | INTRAMUSCULAR | Status: DC | PRN

## 2021-03-23 MED ORDER — ALBUTEROL SULFATE (2.5 MG/3ML) 0.083% IN NEBU: 2.5000 mg | INHALATION_SOLUTION | Freq: Four times a day (QID) | RESPIRATORY_TRACT | Status: DC | PRN

## 2021-03-23 MED ORDER — ONDANSETRON 4 MG PO TBDP
4.0000 mg | ORAL_TABLET | Freq: Once | ORAL | Status: AC | PRN
  Administered 2021-03-23: 4 mg via ORAL
  Filled 2021-03-23: qty 1

## 2021-03-23 MED ORDER — METHOCARBAMOL 500 MG PO TABS
500.0000 mg | ORAL_TABLET | Freq: Four times a day (QID) | ORAL | Status: DC | PRN
  Administered 2021-03-23: 500 mg via ORAL
  Filled 2021-03-23: qty 1

## 2021-03-23 MED ORDER — TIOTROPIUM BROMIDE MONOHYDRATE 2.5 MCG/ACT IN AERS: 2.0000 | INHALATION_SPRAY | Freq: Every day | RESPIRATORY_TRACT | Status: DC

## 2021-03-23 MED ORDER — NICOTINE 21 MG/24HR TD PT24
21.0000 mg | MEDICATED_PATCH | Freq: Every day | TRANSDERMAL | Status: DC
  Administered 2021-03-24: 21 mg via TRANSDERMAL
  Filled 2021-03-23: qty 1

## 2021-03-23 MED ORDER — INSULIN REGULAR(HUMAN) IN NACL 100-0.9 UT/100ML-% IV SOLN
INTRAVENOUS | Status: DC
  Administered 2021-03-23: 9.5 [IU]/h via INTRAVENOUS
  Filled 2021-03-23: qty 100

## 2021-03-23 NOTE — ED Notes (Signed)
RN ordered food tray

## 2021-03-23 NOTE — ED Triage Notes (Signed)
Patient BIB [facility] for severe chest, abdominal, and leg pain, reports some vomiting but denies diarrhea, states this happens every 3 months or so, reports last pain medication on Saturday, seen by pain management

## 2021-03-23 NOTE — ED Notes (Signed)
Got patient on the monitor patient is resting with call bell in reach  ?

## 2021-03-23 NOTE — ED Notes (Signed)
RN attempted report x1.  

## 2021-03-23 NOTE — Progress Notes (Signed)
Inpatient Diabetes Program Recommendations  AACE/ADA: New Consensus Statement on Inpatient Glycemic Control (2015)  Target Ranges:  Prepandial:   less than 140 mg/dL      Peak postprandial:   less than 180 mg/dL (1-2 hours)      Critically ill patients:  140 - 180 mg/dL   Lab Results  Component Value Date   GLUCAP 335 (H) 03/23/2021   HGBA1C 10.7 (A) 02/16/2021    Review of Glycemic Control  Diabetes history: N/V Outpatient Diabetes medications: Jardiance Farxiga ?, ( was previously on 75/25 110 units bid) Lantus 110 units Daily, Humalog 40 units tid, Onglyza 5 mg Daily Current orders for Inpatient glycemic control: IV insulin  Inpatient Diabetes Program Recommendations:    DM Coordinator spoke with pt on 6/1. Pt sees Dr. Lonzo Cloud, Endocrinologist, for DM management (initial visit on 5/4). Pt was prescribed Lantus and encouraged to pick up on 6/1. Pt was to follow up with Dr. Lonzo Cloud.  Will follow glucose trends.  Thanks,  Christena Deem RN, MSN, BC-ADM Inpatient Diabetes Coordinator Team Pager [phone] (8a-5p)

## 2021-03-23 NOTE — H&P (Signed)
History and Physical    Jill Shaw:[id number] DOB: 08/05/1976 DOA: 03/23/2021  PCP: Vevelyn Francois, NP   Patient coming from: home via EMS. Consultants: endocrinology: Dr. Kelton Pillar, pain management: Dr. Ranell Patrick, cardiology: Dr. Virgina Jock  I have personally briefly reviewed patient's old medical records in [place]  Chief Complaint: nausea/vomiting.   HPI: Jill Shaw is a 45 y.o. female with medical history significant of type 2 diabetes on insulin, HTN, anemia, COPD, CKD stage 2, diabetic gastroparesis, seizure due to hypoglycemia per patient, diastolic CHF, sickle cell trait, depression, dyslipidemia.  She presented to ED for recurrent abdominal pain and vomiting and chest pain that started yesterday and continued to be unrelenting so she called EMS early this AM. Abdominal pain was located in epigastric, periumbilical and supra pubic region. Pain is constant and described as sharp and stabbing. Pain is 10+/10. Hot bath and pain medication make it better. She states only the pain medication at the hospital helps her pain. Laying down/sitting still seems to make it worse. She states these episodes reoccur every few months and she always has to come to the hospital. Last time she ate was yesterday AM. She has been drinking. She then had 20-30 episodes of vomiting/dry heaving. States she had nothing to throw up. Last threw up/dry heaved today while at hospital until she got some nausea medication. No coffee ground emesis. Normal urine output. No fever/chills. No dysuria, diarrhea, shortness of breath or cough. She states her chest also hurt her and was constant in nature and was sharp. Pressing down on her chest and moving made it worse, this has resolved. She is also a diabetic and her sugars have been higher than normal at home, around 400-500. She is followed by endocrinology and was recently put on new insulin, Lantus and humalog. She denies any other changes, recent illness.    She also has complaints of leg pain, that appears to be chronic. She has bilateral leg pain from her knees down to her feet. She has had this for "awhile." she denies any recent trauma, injury or fall. She has discussed with her pain doctor who ordered an xray over here to be done. She denies any swelling or redness in her legs. She feels like they are about to crack. Worse with walking up steps.   ED Course: Given 2+L IVF and insulin drip started. Given some pain meds as well. Waiting for DKA labs to come back. Lactic acid elevated at 2.2. patient responded well to IVF and zofran and was much more comfortable. No source of infection identified from ER labs, cultures taken.   Review of Systems: As per HPI otherwise all other systems reviewed and are negative.   Past Medical History:  Diagnosis Date  . Anemia   . Arthritis    knees, hands  . Asthma   . Chronic diastolic (congestive) heart failure ([place])   . COPD (chronic obstructive pulmonary disease) ([place])   . Diabetes mellitus without complication ([place])    type 2  . Dysfunctional uterine bleeding   . GERD (gastroesophageal reflux disease)   . Hypertension   . Neuromuscular disorder (HCC)    neuropathy feet  . Seizures ([place]) 09/12/2017   pt states r/t stress and blood sugar - no meds last one 4 months ago, not seen neurologist  . Sickle cell trait ([place])   . Smoker   . Vitamin D deficiency 10/2019  . Wears glasses     Past Surgical History:  Procedure Laterality Date  . CESAREAN SECTION     x 1. for twins  . DILATION AND CURETTAGE OF UTERUS N/A 08/20/2019   Procedure: DILATATION AND CURETTAGE;  Surgeon: Emily Filbert, MD;  Location: [facility];  Service: Gynecology;  Laterality: N/A;  . ENDOMETRIAL ABLATION N/A 08/20/2019   Procedure: [place] Ablation;  Surgeon: Emily Filbert, MD;  Location: [facility];  Service: Gynecology;  Laterality: N/A;  . EYE SURGERY Bilateral    laser right and cataract removed left eye  . RADIOLOGY WITH  ANESTHESIA N/A 09/16/2019   Procedure: MRI WITH ANESTHESIA   L SPINE WITHOUT CONTRAST, T SPINE WITHOUT CONTRAST , CERVICAL WITHOUT CONTRAST;  Surgeon: Radiologist, Medication, MD;  Location: [facility];  Service: Radiology;  Laterality: N/A;  . TUBAL LIGATION     interval BTL  . UPPER GI ENDOSCOPY  07/2017    Social History  reports that she quit smoking about 6 months ago. Her smoking use included cigarettes. She has a 6.50 pack-year smoking history. She has never used smokeless tobacco. She reports that she does not drink alcohol and does not use drugs.  Allergies  Allergen Reactions  . Ketoprofen Nausea And Vomiting  . Aspirin Nausea Only  . Gabapentin Nausea And Vomiting and Other (See Comments)    upset stomach  . Ibuprofen Nausea And Vomiting  . Liraglutide Nausea And Vomiting  . Naproxen Nausea And Vomiting  . Omeprazole-Sodium Bicarbonate Nausea And Vomiting  . Sulfa Antibiotics Nausea And Vomiting  . Tramadol Nausea And Vomiting and Other (See Comments)    stomach upset    Family History  Problem Relation Age of Onset  . Diabetes Mother   . Hypertension Mother      Prior to Admission medications   Medication Sig Start Date End Date Taking? Authorizing Provider  ACCU-CHEK GUIDE test strip USE AS DIRECTED UP TO FOUR TIMES DAILY Patient taking differently: 1 each by Other route in the morning, at noon, in the evening, and at bedtime. 11/11/20   Vevelyn Francois, NP  Accu-Chek Softclix Lancets lancets USE AS DIRECTED UP TO FOUR TIMES DAILY Patient not taking: Reported on 03/16/2021 02/17/21   Vevelyn Francois, NP  albuterol (PROVENTIL) (2.5 MG/3ML) 0.083% nebulizer solution Take 3 mLs (2.5 mg total) by nebulization every 6 (six) hours as needed for wheezing or shortness of breath. 09/27/20   Icard, Octavio Graves, DO  albuterol (VENTOLIN HFA) 108 (90 Base) MCG/ACT inhaler Inhale 2 puffs into the lungs every 6 (six) hours as needed for wheezing or shortness of breath. 09/27/20   Icard,  Octavio Graves, DO  amitriptyline (ELAVIL) 150 MG tablet Take 150 mg by mouth at bedtime. 02/13/21   [provider]  blood glucose meter kit and supplies KIT 1 each by Other route See admin instructions. Dispense based on patient and insurance preference. Use up to four times daily as directed. (FOR ICD-9 250.00, 250.01). 09/02/20   Vevelyn Francois, NP  budesonide-formoterol (SYMBICORT) 160-4.5 MCG/ACT inhaler INHALE 2 PUFFS INTO THE LUNGS 2 (TWO) TIMES DAILY. Patient taking differently: Inhale 2 puffs into the lungs 2 (two) times daily. 09/27/20   Icard, Octavio Graves, DO  buPROPion (WELLBUTRIN SR) 150 MG 12 hr tablet Take 1 tablet (150 mg total) by mouth 2 (two) times daily. 11/23/20 11/23/21  [name], MD  cetirizine (ZYRTEC) 10 MG tablet Take 10 mg by mouth daily. 02/25/21   [provider]  diclofenac Sodium (VOLTAREN) 1 % GEL Apply 4 g topically  4 (four) times daily as needed (pain). 08/15/20   [provider]  diltiazem (CARDIZEM CD) 180 MG 24 hr capsule Take 180 mg by mouth daily. 02/11/21   [provider]  empagliflozin (JARDIANCE) 10 MG TABS tablet Take 1 tablet (10 mg total) by mouth daily before breakfast. 02/21/21   Shamleffer, Melanie Crazier, MD  FARXIGA 5 MG TABS tablet Take 5 mg by mouth daily. 03/04/21   [provider]  ferrous sulfate 325 (65 FE) MG tablet Take 1 tablet (325 mg total) by mouth 3 (three) times daily with meals. 12/02/20 12/02/21  Vevelyn Francois, NP  fluticasone (FLONASE) 50 MCG/ACT nasal spray Place 2 sprays into both nostrils daily. 12/02/20   Vevelyn Francois, NP  furosemide (LASIX) 40 MG tablet TAKE 1 TABLET(40 MG) BY MOUTH TWICE DAILY Patient taking differently: Take 40 mg by mouth 2 (two) times daily. 02/08/21   Lorretta Harp, MD  Glucosamine Sulfate 1000 MG CAPS Take 1 capsule (1,000 mg total) by mouth 2 (two) times daily. Patient taking differently: Take 1,000 mg by mouth 2 (two) times daily. 08/22/19   Hilts, Legrand Como, MD   Heating Pads (HEATING PAD MOIST/DRY KING SZ) PADS 1 each by Does not apply route 4 (four) times daily as needed. 02/14/21   Raulkar, Clide Deutscher, MD  hydrochlorothiazide (MICROZIDE) 12.5 MG capsule Take 1 capsule (12.5 mg total) by mouth daily. 02/11/21 06/11/21  Cantwell, Celeste C, PA-C  HYDROcodone-acetaminophen (NORCO) 10-325 MG tablet Take 1 tablet by mouth 2 (two) times daily as needed. 02/23/21   Bayard Hugger, NP  hydrocortisone 2.5 % cream Apply topically 2 (two) times daily. Patient taking differently: Apply 1 application topically 2 (two) times daily. 09/22/20   Vevelyn Francois, NP  hydroquinone 4 % cream APPLY TOPICALLY TWICE DAILY Patient taking differently: Apply 1 application topically 2 (two) times daily as needed (pain). 09/13/20   Vevelyn Francois, NP  hydrOXYzine (ATARAX/VISTARIL) 25 MG tablet Take 1 tablet (25 mg total) by mouth 3 (three) times daily as needed (difficulty sleeping). 03/16/21   Pokhrel, Laxman, MD  insulin glargine (LANTUS SOLOSTAR) 100 UNIT/ML Solostar Pen Inject 110 Units into the skin daily. 03/11/21   Shamleffer, Melanie Crazier, MD  insulin lispro (HUMALOG KWIKPEN) 100 UNIT/ML KwikPen Inject 40 Units into the skin 3 (three) times daily. 03/02/21   Shamleffer, Melanie Crazier, MD  Insulin Pen Needle 31G X 8 MM MISC 1 Device by Does not apply route in the morning, at noon, in the evening, and at bedtime. 02/16/21   Shamleffer, Melanie Crazier, MD  lidocaine (LIDODERM) 5 % Place 1 patch onto the skin daily. Remove & Discard patch within 12 hours or as directed by MD 01/07/21   Ranell Patrick, Clide Deutscher, MD  losartan (COZAAR) 50 MG tablet Take 1 tablet (50 mg total) by mouth daily. TAKE 1 TABLET($RemoveBefor'50MG'PQULsuCQIZkd$  TOTAL) BY MOUTH EVERY DAY Patient taking differently: Take 50 mg by mouth daily. 12/02/20 12/02/21  Vevelyn Francois, NP  melatonin 3 MG TABS tablet Take 1 tablet (3 mg total) by mouth at bedtime as needed. 01/12/21   Arrien, Jimmy Picket, MD  methocarbamol (ROBAXIN) 500 MG tablet Take  500-1,000 mg by mouth every 6 (six) hours as needed for muscle spasms. 12/21/20   [provider]  metoprolol succinate (TOPROL-XL) 100 MG 24 hr tablet Take 100 mg by mouth daily. Take with or immediately following a meal.    [provider]  nicotine (NICODERM CQ) 21 mg/24hr patch Place 1  patch (21 mg total) onto the skin daily. 07/12/20   Lauraine Rinne, NP  norethindrone (AYGESTIN) 5 MG tablet Take 2 tablets (10 mg total) by mouth in the morning, at noon, in the evening, and at bedtime. With bleeding 01/14/21   Aletha Halim, MD  omeprazole (PRILOSEC) 40 MG capsule Take 40 mg by mouth daily.    [provider]  ondansetron (ZOFRAN ODT) 4 MG disintegrating tablet Take 1 tablet (4 mg total) by mouth every 8 (eight) hours as needed for nausea or vomiting. 12/23/20   Lucrezia Starch, MD  pantoprazole (PROTONIX) 40 MG tablet Take 1 tablet (40 mg total) by mouth daily. 12/02/20 12/02/21  Vevelyn Francois, NP  potassium chloride SA (KLOR-CON) 20 MEQ tablet Take 20 mEq by mouth daily. 11/16/20   [provider]  rOPINIRole (REQUIP) 0.25 MG tablet Take 1 tablet (0.25 mg total) by mouth 3 (three) times daily. 11/19/20 11/19/21  Izora Ribas, MD  rosuvastatin (CRESTOR) 5 MG tablet Take 1 tablet (5 mg total) by mouth daily. 08/11/20   Lorretta Harp, MD  saxagliptin HCl (ONGLYZA) 5 MG TABS tablet Take 1 tablet (5 mg total) by mouth daily. 12/02/20 12/02/21  Vevelyn Francois, NP  spironolactone (ALDACTONE) 50 MG tablet TAKE 1 TABLET(50 MG) BY MOUTH DAILY Patient taking differently: Take 50 mg by mouth daily. 12/22/20   Patwardhan, Reynold Bowen, MD  Tiotropium Bromide Monohydrate (SPIRIVA RESPIMAT) 2.5 MCG/ACT AERS Inhale 2 puffs into the lungs daily. 09/27/20   Icard, Octavio Graves, DO  tiZANidine (ZANAFLEX) 4 MG capsule Take 4 mg by mouth 3 (three) times daily. 03/02/21   [provider]  topiramate (TOPAMAX) 25 MG tablet Take 1 tablet (25 mg total) by mouth 2 (two) times daily.  01/31/21   Raulkar, Clide Deutscher, MD  traZODone (DESYREL) 100 MG tablet Take 100 mg by mouth at bedtime.    [provider]  Vitamin D, Ergocalciferol, (DRISDOL) 1.25 MG (50000 UNIT) CAPS capsule Take 1 capsule (50,000 Units total) by mouth every 7 (seven) days. Patient taking differently: Take 50,000 Units by mouth every Friday. 12/02/19   Vevelyn Francois, NP  vitamin E (VITAMIN E) 180 MG (400 UNITS) capsule Take 1 capsule (400 Units total) by mouth daily. 01/07/21   Izora Ribas, MD    Physical Exam: Vitals:   03/23/21 1400 03/23/21 1415 03/23/21 1430 03/23/21 1445  BP:  (!) 148/75 (!) 149/77 (!) 157/93  Pulse: (!) 114 (!) 111 (!) 109 (!) 111  Resp: (!) 25 (!) 27    Temp:      TempSrc:      SpO2: 98% 100% 99% 100%  Weight:      Height:        Constitutional: NAD, calm, comfortable Vitals:   03/23/21 1400 03/23/21 1415 03/23/21 1430 03/23/21 1445  BP:  (!) 148/75 (!) 149/77 (!) 157/93  Pulse: (!) 114 (!) 111 (!) 109 (!) 111  Resp: (!) 25 (!) 27    Temp:      TempSrc:      SpO2: 98% 100% 99% 100%  Weight:      Height:       Eyes: PERRL, lids and conjunctivae normal ENMT: Mucous membranes are moist. Posterior pharynx clear of any exudate or lesions.Normal dentition.  Neck: normal, supple, no masses, no thyromegaly Respiratory: clear to auscultation bilaterally, no wheezing, no crackles. Normal respiratory effort. No accessory muscle use.  Cardiovascular:  Mildly tachycardic, normal  rhythm, no murmurs /  rubs / gallops. No extremity edema. 2+ pedal pulses. No carotid bruits.  Abdomen: no tenderness, no masses palpated. No hepatosplenomegaly. Bowel sounds positive.  Musculoskeletal: no clubbing / cyanosis. No joint deformity upper and lower extremities. Good ROM, no contractures. Normal muscle tone. Negative draw signs for bilateral knees. TTP over both patella. No effusion/edema/warmth/erythema. Full flexion and extension to 180 degrees.  Skin: no rashes, lesions,  ulcers. No induration. No ulcers on feet.  Neurologic: CN 2-12 grossly intact. Sensation intact, DTR normal. Strength 5/5 in all 4.  Psychiatric: Normal judgment and insight. Alert and oriented x 3. Normal mood.     Labs on Admission: I have personally reviewed following labs and imaging studies  CBC: Recent Labs  Lab 03/23/21 0614  WBC 13.6*  NEUTROABS 9.8*  HGB 11.3*  HCT 36.8  MCV 66.5*  PLT 509    Basic Metabolic Panel: Recent Labs  Lab 03/23/21 0614 03/23/21 1406  NA 131* 133*  K 4.6 5.4*  CL 95* 99  CO2 17* 21*  GLUCOSE 373* 371*  BUN 19 19  CREATININE 1.77* 1.49*  CALCIUM 9.6 9.4    GFR: Estimated Creatinine Clearance: 61.8 mL/min (A) (by C-G formula based on SCr of 1.49 mg/dL (H)).  Liver Function Tests: Recent Labs  Lab 03/23/21 0614  AST 38  ALT 15  ALKPHOS 98  BILITOT 1.0  PROT 7.9  ALBUMIN 4.1    Urine analysis:    Component Value Date/Time   COLORURINE YELLOW 03/23/2021 [place] 03/23/2021 0534   LABSPEC 1.026 03/23/2021 0534   PHURINE 5.0 03/23/2021 0534   GLUCOSEU >=500 (A) 03/23/2021 0534   HGBUR NEGATIVE 03/23/2021 0534   BILIRUBINUR NEGATIVE 03/23/2021 0534   BILIRUBINUR negative 06/02/2020 1419   BILIRUBINUR NEGATIVE 11/05/2019 1342   KETONESUR 5 (A) 03/23/2021 0534   PROTEINUR NEGATIVE 03/23/2021 0534   UROBILINOGEN 0.2 11/12/2020 1148   NITRITE NEGATIVE 03/23/2021 0534   LEUKOCYTESUR NEGATIVE 03/23/2021 0534    Radiological Exams on Admission: CT ABDOMEN PELVIS WO CONTRAST  Result Date: 03/23/2021 CLINICAL DATA:  Abdominal pain EXAM: CT ABDOMEN AND PELVIS WITHOUT CONTRAST TECHNIQUE: Multidetector CT imaging of the abdomen and pelvis was performed following the standard protocol without IV contrast. COMPARISON:  12/23/2020, 01/31/2020 FINDINGS: Lower chest: No acute abnormality. Hepatobiliary: Areas of low-density again noted throughout the liver are unchanged since prior study, likely geographic changes of  fatty infiltration. Gallbladder unremarkable. Pancreas: No focal abnormality or ductal dilatation. Spleen: No focal abnormality.  Normal size. Adrenals/Urinary Tract: No adrenal abnormality. No focal renal abnormality. No stones or hydronephrosis. Urinary bladder is unremarkable. Stomach/Bowel: Normal appendix. Stomach, large and small bowel grossly unremarkable. Vascular/Lymphatic: No evidence of aneurysm or adenopathy. Reproductive: Uterus and adnexa unremarkable.  No mass. Other: No free fluid or free air. Musculoskeletal: No acute bony abnormality. IMPRESSION: Again noted are areas of low-density scattered throughout the liver. This is unchanged dating back to 01/31/2020. This likely reflects geographic fatty infiltration, but could be further evaluated with nonemergent MRI. No acute findings in the abdomen or pelvis. Electronically Signed   By: Rolm Baptise M.D.   On: 03/23/2021 12:26   DG Abdomen Acute W/Chest  Result Date: 03/23/2021 CLINICAL DATA:  Abdominal and chest pain. EXAM: DG ABDOMEN ACUTE WITH 1 VIEW CHEST COMPARISON:  Chest x-ray 03/14/2021.  Abdomen 01/10/2021. FINDINGS: Mediastinum hilar structures normal. Heart size normal. Low lung volumes. Mild atelectasis and or scarring right mid lung again noted. No pleural effusion or pneumothorax. Soft tissues the  abdomen are unremarkable. Pelvic calcifications consistent phleboliths. No bowel distention. Prominent amount of stool noted throughout the colon. Constipation could present in this fashion. No free air. No acute bony abnormality identified. IMPRESSION: 1. Low lung volumes. Mild atelectasis and or scarring right mid lung again noted. 2. Prominent amount of stool noted throughout the colon suggesting constipation. No bowel distention noted. Electronically Signed   By: Marcello Moores  Register   On: 03/23/2021 07:47    EKG: Independently reviewed. Sinus tachy with rate of 114.   Assessment/Plan Principal Problem:   DKA -increased AG/co2, but  responded quickly to bolus and short IV insulin. Gap closed and co2 returned to normal. No source of infection, cultures pending. Likely secondary to dehydration.  -transitioned her back to [facility] insulin and started her home dose of lantus (110 units/daily. I did 50 units BID) and stopped insulin drip -continue home dose of 40 units of humalog prior to meals and sliding scale for coverage.  -continue IVF -able to eat now that out of DKA.     Intractable nausea and vomiting -? Due to gastroparesis -responded well to IVF and zofran and has resolved.    Generalized abdominal pain --CT scan with no acute findings, again ? If gastroparesis -pain meds ordered for her and will continue to monitor.     UnControlled type 2 diabetes mellitus without complication, with long-term current use of insulin ([place]) -poorly controlled diabetes. Last A1c was 10.7 in may 2022 -insulin just changed by endocrinology. Will change her lantus to 50 units BID, continue her humalog at 40 units premeal and sliding scale ordered -diabetic education ordered  -held jardiance due to decreased GFR, continue onglyza tomorrow. Can likely add back tomorrow if renal function improves.      Essential hypertension -slightly elevated today. Will continue home medication and continue to monitor. Continued her toprol-xl and cardizem.  -held nephrotoxic drugs due to AKI: cozaar, sprinolactone and hctz.  -will likely needed added back if renal function improves with IVF.      AKI on CKD stage 2 -likely prerenal with her dehydration -bolused and will continue IVF -held home meds that are nephrotoxic including cozaar, spironolactone and hctz and lasix.   -repeat labs in AM   Diastolic CHF -last echo [id number] EF of 55-60% with grade 1 diastolic dysfunction.  -no evidence of volume overload, but has received a lot of fluid in ER/maintenance with DKA/dehydration -held her sprinolactone/hctz/cozaar and lasix. Hopefully can restart  tomorrow with improvement in renal function.  -strict I/O, daily weights.       Anemia of chronic disease -continued home oral iron -h&H stable at baseline.      Knee pain, chronic -seen by pain management and will order xrays that are standing -no acute findings on exam  -checking RF, uric acid and inflammatory markers.     Seizure  -patient states due to hypoglycemia -no recent seizure history.    Chronic obstructive pulmonary disease (HCC) -continued home inhalers, no evidence of any exacerbation today.   Depression  Hx of recent OD on depression drug savella. Put on hydroxyzine at night. Not seen at follow up for depression yet and no longer on hydroxyzine. Does not endorse any si/hi/ah/vh today.   Tobacco abuse -nicoderm patch  -tobacco cessation counseling     DVT prophylaxis: lovenox  Code Status:   Full   Family Communication:  none Disposition Plan:   Patient is from:  Home   Anticipated DC to:  home  Anticipated DC  date:  2-3 days     Consults called:  none  Admission status:  Inpatient      Orma Flaming MD [facility]  How to contact the [facility] Attending or Consulting provider [place] or covering provider during after hours [place], for this patient?   1. Check the care team in [facility] and look for a) attending/consulting [facility] provider listed and b) the [facility] team listed 2. Log into [email or web address] and use [place]'s universal password to access. If you do not have the password, please contact the hospital operator. 3. Locate the [facility] provider you are looking for under [facility] and page to a number that you can be directly reached. 4. If you still have difficulty reaching the provider, please page the [facility] (Director on Call) for the Hospitalists listed on amion for assistance.  03/23/2021, 3:40 PM

## 2021-03-23 NOTE — ED Provider Notes (Signed)
[facility] EMERGENCY DEPARTMENT Provider Note   CSN: [id number] Arrival date & time: 03/23/21  0524     History Chief Complaint  Patient presents with  . Abdominal Pain  . Chest Pain  . Leg Pain    Jill Shaw is a 45 y.o. female history of obesity, anemia, asthma, CHF, COPD, type 2 diabetes, GERD, hypertension, neuropathy, seizures, diabetic gastroparesis, CKD.  Patient presents to the ER today for abdominal pain patient reports that pain occurs every few months she describes generalized abdominal pain aching severe constant occasionally radiating up into her chest whenever she vomits.  Patient reports multiple episodes of nonbloody/nonbilious emesis today.  Additionally patient reports aching of her bilateral shoulders and bilateral legs for the past 3 days, no known injury, denies any swelling or color change.  Of note patient reports 1 month ago her primary care provider changed what type of insulin she takes and has been having difficulty controlling her blood sugar since that time.  Patient denies fever/chills, fall/injury, active chest pain, cough/hemoptysis, history of blood clot, exogenous hormone use or any additional concerns. HPI     Past Medical History:  Diagnosis Date  . Anemia   . Arthritis    knees, hands  . Asthma   . Chronic diastolic (congestive) heart failure ([place])   . COPD (chronic obstructive pulmonary disease) ([place])   . Diabetes mellitus without complication ([place])    type 2  . Dysfunctional uterine bleeding   . GERD (gastroesophageal reflux disease)   . Hypertension   . Neuromuscular disorder (HCC)    neuropathy feet  . Seizures ([place]) 09/12/2017   pt states r/t stress and blood sugar - no meds last one 4 months ago, not seen neurologist  . Sickle cell trait ([place])   . Smoker   . Vitamin D deficiency 10/2019  . Wears glasses     Patient Active Problem List   Diagnosis Date Noted  . Serotonin syndrome 03/14/2021  .  Overdose, undetermined intent, initial encounter 03/14/2021  . Dyslipidemia 02/16/2021  . Insulin resistance 02/16/2021  . Type 2 diabetes mellitus without complication, with long-term current use of insulin ([place]) 02/16/2021  . Amitriptyline overdose, accidental or unintentional, initial encounter 01/09/2021  . Ingestion of substance, undetermined intent, initial encounter 01/08/2021  . Hyperglycemia due to type 2 diabetes mellitus ([place]) 01/08/2021  . SIRS (systemic inflammatory response syndrome) ([place]) 01/08/2021  . Exertional chest pain 12/31/2020  . SOB (shortness of breath) 12/24/2020  . DKA (diabetic ketoacidosis) ([place]) 10/15/2020  . Hyperkalemia 10/15/2020  . Severe sepsis ([place]) 10/15/2020  . Acute metabolic encephalopathy 40/98/1191  . Sinus tachycardia 10/14/2020  . Precordial pain 10/14/2020  . Abnormal findings on diagnostic imaging of lung 08/19/2020  . Physical deconditioning 08/19/2020  . History of endometrial ablation 07/27/2020  . History of alcohol use 07/20/2020  . Current moderate episode of major depressive disorder without prior episode ([place]) 07/20/2020  . Sickle cell trait ([place]) 07/19/2020  . Elevated d-dimer 07/13/2020  . Healthcare maintenance 07/12/2020  . Exertional dyspnea 07/12/2020  . Atypical chest pain 07/12/2020  . At risk for obstructive sleep apnea 07/12/2020  . DKA (diabetic ketoacidoses) 07/02/2020  . Acute on chronic diastolic (congestive) heart failure ([place]) 06/22/2020  . Abdominal pain 03/08/2020  . Chronic diastolic CHF (congestive heart failure) ([place]) 03/08/2020  . AKI (acute kidney injury) ([place]) 01/29/2020  . Chronic anemia 12/19/2019  . Elevated sed rate 11/26/2019  . Elevated C-reactive protein (CRP) 11/26/2019  . Hypoglycemia  02/07/2019  . Hypotension 02/07/2019  . Lactic acidosis 02/07/2019  . Right ankle pain 02/07/2019  . Chronic obstructive pulmonary disease () 02/05/2019  . Seizure ([place]) 07/31/2018  . Vitamin D deficiency  05/31/2017  . Gastroesophageal reflux disease 05/24/2017  . Abnormal uterine bleeding (AUB) 05/17/2017  . Anemia of chronic disease 04/06/2017  . Knee pain, chronic 03/27/2016  . Controlled type 2 diabetes mellitus without complication, with long-term current use of insulin ([place]) 03/27/2016  . Essential hypertension 03/27/2016  . Obesity, Class III, BMI 40-49.9 (morbid obesity) ([place]) 03/27/2016  . Irritable bowel syndrome with constipation 03/27/2016  . Tobacco dependence 03/27/2016  . CKD (chronic kidney disease) stage 2, GFR 60-89 ml/min 03/25/2016  . Arthropathy, lower leg 05/04/2013  . CTS (carpal tunnel syndrome) 05/04/2013  . Peripheral edema 05/04/2013  . Chronic pain 05/13/2012  . Diabetic gastroparesis ([place]) 11/06/2006  . Hot flashes 11/06/2006    Past Surgical History:  Procedure Laterality Date  . CESAREAN SECTION     x 1. for twins  . DILATION AND CURETTAGE OF UTERUS N/A 08/20/2019   Procedure: DILATATION AND CURETTAGE;  Surgeon: Emily Filbert, MD;  Location: [facility];  Service: Gynecology;  Laterality: N/A;  . ENDOMETRIAL ABLATION N/A 08/20/2019   Procedure: [place] Ablation;  Surgeon: Emily Filbert, MD;  Location: [facility];  Service: Gynecology;  Laterality: N/A;  . EYE SURGERY Bilateral    laser right and cataract removed left eye  . RADIOLOGY WITH ANESTHESIA N/A 09/16/2019   Procedure: MRI WITH ANESTHESIA   L SPINE WITHOUT CONTRAST, T SPINE WITHOUT CONTRAST , CERVICAL WITHOUT CONTRAST;  Surgeon: Radiologist, Medication, MD;  Location: [facility];  Service: Radiology;  Laterality: N/A;  . TUBAL LIGATION     interval BTL  . UPPER GI ENDOSCOPY  07/2017     OB History    Gravida  3   Para      Term      Preterm      AB      Living  3     SAB      IAB      Ectopic      Multiple      Live Births  3        Obstetric Comments  Vaginal x 1 and then c-section for twins        Family History  Problem Relation Age of Onset  . Diabetes Mother   . Hypertension  Mother     Social History   Tobacco Use  . Smoking status: Former Smoker    Packs/day: 0.25    Years: 26.00    Pack years: 6.50    Types: Cigarettes    Quit date: 09/13/2020    Years since quitting: 0.5  . Smokeless tobacco: Never Used  . Tobacco comment: 4-5 cigarettes/day  Vaping Use  . Vaping Use: Never used  Substance Use Topics  . Alcohol use: No  . Drug use: No    Home Medications Prior to Admission medications   Medication Sig Start Date End Date Taking? Authorizing Provider  ACCU-CHEK GUIDE test strip USE AS DIRECTED UP TO FOUR TIMES DAILY Patient taking differently: 1 each by Other route in the morning, at noon, in the evening, and at bedtime. 11/11/20   Vevelyn Francois, NP  Accu-Chek Softclix Lancets lancets USE AS DIRECTED UP TO FOUR TIMES DAILY Patient not taking: Reported on 03/16/2021 02/17/21   Vevelyn Francois, NP  albuterol (PROVENTIL) (2.5 MG/3ML) 0.083%  nebulizer solution Take 3 mLs (2.5 mg total) by nebulization every 6 (six) hours as needed for wheezing or shortness of breath. 09/27/20   Icard, Octavio Graves, DO  albuterol (VENTOLIN HFA) 108 (90 Base) MCG/ACT inhaler Inhale 2 puffs into the lungs every 6 (six) hours as needed for wheezing or shortness of breath. 09/27/20   Icard, Octavio Graves, DO  amitriptyline (ELAVIL) 150 MG tablet Take 150 mg by mouth at bedtime. 02/13/21   [provider]  blood glucose meter kit and supplies KIT 1 each by Other route See admin instructions. Dispense based on patient and insurance preference. Use up to four times daily as directed. (FOR ICD-9 250.00, 250.01). 09/02/20   Vevelyn Francois, NP  budesonide-formoterol (SYMBICORT) 160-4.5 MCG/ACT inhaler INHALE 2 PUFFS INTO THE LUNGS 2 (TWO) TIMES DAILY. Patient taking differently: Inhale 2 puffs into the lungs 2 (two) times daily. 09/27/20   Icard, Octavio Graves, DO  buPROPion (WELLBUTRIN SR) 150 MG 12 hr tablet Take 1 tablet (150 mg total) by mouth 2 (two) times daily. 11/23/20 11/23/21   [name], MD  cetirizine (ZYRTEC) 10 MG tablet Take 10 mg by mouth daily. 02/25/21   [provider]  diclofenac Sodium (VOLTAREN) 1 % GEL Apply 4 g topically 4 (four) times daily as needed (pain). 08/15/20   [provider]  diltiazem (CARDIZEM CD) 180 MG 24 hr capsule Take 180 mg by mouth daily. 02/11/21   [provider]  empagliflozin (JARDIANCE) 10 MG TABS tablet Take 1 tablet (10 mg total) by mouth daily before breakfast. 02/21/21   Shamleffer, Melanie Crazier, MD  FARXIGA 5 MG TABS tablet Take 5 mg by mouth daily. 03/04/21   [provider]  ferrous sulfate 325 (65 FE) MG tablet Take 1 tablet (325 mg total) by mouth 3 (three) times daily with meals. 12/02/20 12/02/21  Vevelyn Francois, NP  fluticasone (FLONASE) 50 MCG/ACT nasal spray Place 2 sprays into both nostrils daily. 12/02/20   Vevelyn Francois, NP  furosemide (LASIX) 40 MG tablet TAKE 1 TABLET(40 MG) BY MOUTH TWICE DAILY Patient taking differently: Take 40 mg by mouth 2 (two) times daily. 02/08/21   Lorretta Harp, MD  Glucosamine Sulfate 1000 MG CAPS Take 1 capsule (1,000 mg total) by mouth 2 (two) times daily. Patient taking differently: Take 1,000 mg by mouth 2 (two) times daily. 08/22/19   Hilts, Legrand Como, MD  Heating Pads (HEATING PAD MOIST/DRY KING SZ) PADS 1 each by Does not apply route 4 (four) times daily as needed. 02/14/21   Raulkar, Clide Deutscher, MD  hydrochlorothiazide (MICROZIDE) 12.5 MG capsule Take 1 capsule (12.5 mg total) by mouth daily. 02/11/21 06/11/21  Cantwell, Celeste C, PA-C  HYDROcodone-acetaminophen (NORCO) 10-325 MG tablet Take 1 tablet by mouth 2 (two) times daily as needed. 02/23/21   Bayard Hugger, NP  hydrocortisone 2.5 % cream Apply topically 2 (two) times daily. Patient taking differently: Apply 1 application topically 2 (two) times daily. 09/22/20   Vevelyn Francois, NP  hydroquinone 4 % cream APPLY TOPICALLY TWICE DAILY Patient taking differently: Apply 1 application  topically 2 (two) times daily as needed (pain). 09/13/20   Vevelyn Francois, NP  hydrOXYzine (ATARAX/VISTARIL) 25 MG tablet Take 1 tablet (25 mg total) by mouth 3 (three) times daily as needed (difficulty sleeping). 03/16/21   Pokhrel, Laxman, MD  insulin glargine (LANTUS SOLOSTAR) 100 UNIT/ML Solostar Pen Inject 110 Units into the skin daily. 03/11/21   Shamleffer, Melanie Crazier, MD  insulin lispro (HUMALOG KWIKPEN) 100 UNIT/ML KwikPen Inject 40 Units into the skin 3 (three) times daily. 03/02/21   Shamleffer, Melanie Crazier, MD  Insulin Pen Needle 31G X 8 MM MISC 1 Device by Does not apply route in the morning, at noon, in the evening, and at bedtime. 02/16/21   Shamleffer, Melanie Crazier, MD  lidocaine (LIDODERM) 5 % Place 1 patch onto the skin daily. Remove & Discard patch within 12 hours or as directed by MD 01/07/21   Ranell Patrick, Clide Deutscher, MD  losartan (COZAAR) 50 MG tablet Take 1 tablet (50 mg total) by mouth daily. TAKE 1 TABLET(50MG TOTAL) BY MOUTH EVERY DAY Patient taking differently: Take 50 mg by mouth daily. 12/02/20 12/02/21  Vevelyn Francois, NP  melatonin 3 MG TABS tablet Take 1 tablet (3 mg total) by mouth at bedtime as needed. 01/12/21   Arrien, Jimmy Picket, MD  methocarbamol (ROBAXIN) 500 MG tablet Take 500-1,000 mg by mouth every 6 (six) hours as needed for muscle spasms. 12/21/20   [provider]  metoprolol succinate (TOPROL-XL) 100 MG 24 hr tablet Take 100 mg by mouth daily. Take with or immediately following a meal.    [provider]  nicotine (NICODERM CQ) 21 mg/24hr patch Place 1 patch (21 mg total) onto the skin daily. 07/12/20   Lauraine Rinne, NP  norethindrone (AYGESTIN) 5 MG tablet Take 2 tablets (10 mg total) by mouth in the morning, at noon, in the evening, and at bedtime. With bleeding 01/14/21   Aletha Halim, MD  omeprazole (PRILOSEC) 40 MG capsule Take 40 mg by mouth daily.    [provider]  ondansetron (ZOFRAN ODT) 4 MG disintegrating tablet  Take 1 tablet (4 mg total) by mouth every 8 (eight) hours as needed for nausea or vomiting. 12/23/20   Lucrezia Starch, MD  pantoprazole (PROTONIX) 40 MG tablet Take 1 tablet (40 mg total) by mouth daily. 12/02/20 12/02/21  Vevelyn Francois, NP  potassium chloride SA (KLOR-CON) 20 MEQ tablet Take 20 mEq by mouth daily. 11/16/20   [provider]  rOPINIRole (REQUIP) 0.25 MG tablet Take 1 tablet (0.25 mg total) by mouth 3 (three) times daily. 11/19/20 11/19/21  Izora Ribas, MD  rosuvastatin (CRESTOR) 5 MG tablet Take 1 tablet (5 mg total) by mouth daily. 08/11/20   Lorretta Harp, MD  saxagliptin HCl (ONGLYZA) 5 MG TABS tablet Take 1 tablet (5 mg total) by mouth daily. 12/02/20 12/02/21  Vevelyn Francois, NP  spironolactone (ALDACTONE) 50 MG tablet TAKE 1 TABLET(50 MG) BY MOUTH DAILY Patient taking differently: Take 50 mg by mouth daily. 12/22/20   Patwardhan, Reynold Bowen, MD  Tiotropium Bromide Monohydrate (SPIRIVA RESPIMAT) 2.5 MCG/ACT AERS Inhale 2 puffs into the lungs daily. 09/27/20   Icard, Octavio Graves, DO  tiZANidine (ZANAFLEX) 4 MG capsule Take 4 mg by mouth 3 (three) times daily. 03/02/21   [provider]  topiramate (TOPAMAX) 25 MG tablet Take 1 tablet (25 mg total) by mouth 2 (two) times daily. 01/31/21   Raulkar, Clide Deutscher, MD  traZODone (DESYREL) 100 MG tablet Take 100 mg by mouth at bedtime.    [provider]  Vitamin D, Ergocalciferol, (DRISDOL) 1.25 MG (50000 UNIT) CAPS capsule Take 1 capsule (50,000 Units total) by mouth every 7 (seven) days. Patient taking differently: Take 50,000 Units by mouth every Friday. 12/02/19   Vevelyn Francois, NP  vitamin E (VITAMIN E) 180 MG (400 UNITS) capsule Take 1 capsule (400  Units total) by mouth daily. 01/07/21   Izora Ribas, MD    Allergies    Ketoprofen, Aspirin, Gabapentin, Ibuprofen, Liraglutide, Naproxen, Omeprazole-sodium bicarbonate, Sulfa antibiotics, and Tramadol  Review of Systems   Review of Systems Ten  systems are reviewed and are negative for acute change except as noted in the HPI  Physical Exam Updated Vital Signs BP (!) 169/86   Pulse (!) 111   Temp 98 F (36.7 C) (Oral)   Resp (!) 26   Ht _0  (1.626 m)   Wt 121.1 kg   SpO2 98%   BMI 45.83 kg/m   Physical Exam Constitutional:      General: She is not in acute distress.    Appearance: Normal appearance. She is well-developed. She is not ill-appearing or diaphoretic.  HENT:     Head: Normocephalic and atraumatic.  Eyes:     General: Vision grossly intact. Gaze aligned appropriately.     Pupils: Pupils are equal, round, and reactive to light.  Neck:     Trachea: Trachea and phonation normal.  Cardiovascular:     Rate and Rhythm: Regular rhythm. Tachycardia present.  Pulmonary:     Effort: Pulmonary effort is normal. No respiratory distress.     Breath sounds: Normal breath sounds.  Abdominal:     General: There is no distension.     Palpations: Abdomen is soft.     Tenderness: There is generalized abdominal tenderness. There is no guarding or rebound.  Musculoskeletal:        General: Normal range of motion.     Cervical back: Normal range of motion.  Skin:    General: Skin is warm and dry.  Neurological:     Mental Status: She is alert.     GCS: GCS eye subscore is 4. GCS verbal subscore is 5. GCS motor subscore is 6.     Comments: Speech is clear and goal oriented, follows commands Major Cranial nerves without deficit, no facial droop Moves extremities without ataxia, coordination intact  Psychiatric:        Behavior: Behavior normal.     ED Results / Procedures / Treatments   Labs (all labs ordered are listed, but only abnormal results are displayed) Labs Reviewed  CBC WITH DIFFERENTIAL/PLATELET - Abnormal; Notable for the following components:      Result Value   WBC 13.6 (*)    RBC 5.53 (*)    Hemoglobin 11.3 (*)    MCV 66.5 (*)    MCH 20.4 (*)    RDW 24.7 (*)    Neutro Abs 9.8 (*)    All  other components within normal limits  COMPREHENSIVE METABOLIC PANEL - Abnormal; Notable for the following components:   Sodium 131 (*)    Chloride 95 (*)    CO2 17 (*)    Glucose, Bld 373 (*)    Creatinine, Ser 1.77 (*)    GFR, Estimated 36 (*)    Anion gap 19 (*)    All other components within normal limits  URINALYSIS, ROUTINE W REFLEX MICROSCOPIC - Abnormal; Notable for the following components:   Glucose, UA >=500 (*)    Ketones, ur 5 (*)    Bacteria, UA RARE (*)    All other components within normal limits  LACTIC ACID, PLASMA - Abnormal; Notable for the following components:   Lactic Acid, Venous 2.2 (*)    All other components within normal limits  CBG MONITORING, ED - Abnormal; Notable for the following  components:   Glucose-Capillary 335 (*)    All other components within normal limits  RESP PANEL BY RT-PCR (FLU A&B, COVID) ARPGX2  CULTURE, BLOOD (ROUTINE X 2)  CULTURE, BLOOD (ROUTINE X 2)  LIPASE, BLOOD  PREGNANCY, URINE  LACTIC ACID, PLASMA  BASIC METABOLIC PANEL  BASIC METABOLIC PANEL  BASIC METABOLIC PANEL  BASIC METABOLIC PANEL  BETA-HYDROXYBUTYRIC ACID  BETA-HYDROXYBUTYRIC ACID  RAPID URINE DRUG SCREEN, HOSP PERFORMED  I-STAT VENOUS BLOOD GAS, ED  I-STAT BETA HCG BLOOD, ED ([facility], [facility], [facility] ONLY)  TROPONIN I (HIGH SENSITIVITY)  TROPONIN I (HIGH SENSITIVITY)    EKG EKG Interpretation  Date/Time:  Wednesday March 23 2021 06:02:54 EDT Ventricular Rate:  114 PR Interval:  140 QRS Duration: 80 QT Interval:  350 QTC Calculation: 482 R Axis:   45 Text Interpretation: Sinus tachycardia Otherwise normal ECG Confirmed by Pattricia Boss [phone]) on 03/23/2021 9:05:18 AM   Radiology CT ABDOMEN PELVIS WO CONTRAST  Result Date: 03/23/2021 CLINICAL DATA:  Abdominal pain EXAM: CT ABDOMEN AND PELVIS WITHOUT CONTRAST TECHNIQUE: Multidetector CT imaging of the abdomen and pelvis was performed following the standard protocol without IV contrast. COMPARISON:  12/23/2020,  01/31/2020 FINDINGS: Lower chest: No acute abnormality. Hepatobiliary: Areas of low-density again noted throughout the liver are unchanged since prior study, likely geographic changes of fatty infiltration. Gallbladder unremarkable. Pancreas: No focal abnormality or ductal dilatation. Spleen: No focal abnormality.  Normal size. Adrenals/Urinary Tract: No adrenal abnormality. No focal renal abnormality. No stones or hydronephrosis. Urinary bladder is unremarkable. Stomach/Bowel: Normal appendix. Stomach, large and small bowel grossly unremarkable. Vascular/Lymphatic: No evidence of aneurysm or adenopathy. Reproductive: Uterus and adnexa unremarkable.  No mass. Other: No free fluid or free air. Musculoskeletal: No acute bony abnormality. IMPRESSION: Again noted are areas of low-density scattered throughout the liver. This is unchanged dating back to 01/31/2020. This likely reflects geographic fatty infiltration, but could be further evaluated with nonemergent MRI. No acute findings in the abdomen or pelvis. Electronically Signed   By: Rolm Baptise M.D.   On: 03/23/2021 12:26   DG Abdomen Acute W/Chest  Result Date: 03/23/2021 CLINICAL DATA:  Abdominal and chest pain. EXAM: DG ABDOMEN ACUTE WITH 1 VIEW CHEST COMPARISON:  Chest x-ray 03/14/2021.  Abdomen 01/10/2021. FINDINGS: Mediastinum hilar structures normal. Heart size normal. Low lung volumes. Mild atelectasis and or scarring right mid lung again noted. No pleural effusion or pneumothorax. Soft tissues the abdomen are unremarkable. Pelvic calcifications consistent phleboliths. No bowel distention. Prominent amount of stool noted throughout the colon. Constipation could present in this fashion. No free air. No acute bony abnormality identified. IMPRESSION: 1. Low lung volumes. Mild atelectasis and or scarring right mid lung again noted. 2. Prominent amount of stool noted throughout the colon suggesting constipation. No bowel distention noted. Electronically  Signed   By: Marcello Moores  Register   On: 03/23/2021 07:47    Procedures Procedures   Medications Ordered in ED Medications  insulin regular, human (MYXREDLIN) 100 units/ 100 mL infusion (9.5 Units/hr Intravenous New Bag/Given 03/23/21 1418)  lactated ringers infusion ( Intravenous New Bag/Given 03/23/21 1243)  dextrose 5 % in lactated ringers infusion (has no administration in time range)  dextrose 50 % solution 0-50 mL (has no administration in time range)  ondansetron (ZOFRAN-ODT) disintegrating tablet 4 mg (4 mg Oral Given 03/23/21 0807)  sodium chloride 0.9 % bolus 1,000 mL (0 mLs Intravenous Stopped 03/23/21 1131)  fentaNYL (SUBLIMAZE) injection 50 mcg (50 mcg Intravenous Given 03/23/21 1018)  lactated ringers bolus 2,422  mL (0 mL/kg  121.1 kg Intravenous Stopped 03/23/21 1245)  potassium chloride 10 mEq in 100 mL IVPB (0 mEq Intravenous Stopped 03/23/21 1345)  HYDROmorphone (DILAUDID) injection 1 mg (1 mg Intravenous Given 03/23/21 1117)  ondansetron (ZOFRAN) injection 2 mg (2 mg Intravenous Given 03/23/21 1117)    ED Course  I have reviewed the triage vital signs and the nursing notes.  Pertinent labs & imaging results that were available during my care of the patient were reviewed by me and considered in my medical decision making (see chart for details).  Clinical Course as of 03/23/21 1422  Wed Mar 23, 7590  612 45 year old female with some chronic pain issues here with worsening of chest abdomen and leg pain with associated nausea and vomiting.  She is a diabetic and has a gap with elevated blood sugars.  Getting fluids insulin pain medication.  Will need admission to the hospital for further management of her symptoms. [MB]    Clinical Course User Index [MB] Hayden Rasmussen, MD   MDM Rules/Calculators/A&P                         Additional history obtained from: 1. Nursing notes from this visit. 2. Review of electronic medical records.  Patient was admitted to the hospital 03/14/2021  following serotonin syndrome.  She was treated with Ativan.  No SI.  Discontinued Savella and started Atarax.  Psychiatry cleared patient for discharge.  Echocardiogram 04/30/2020 shows left ventricular ejection fraction 55-60%.  Right ventricular systolic function normal. ------------------ I reviewed and interpreted labs which include: Lactic 2.2 COVID/influenza panel negative CBG 335 CBC shows leukocytosis of 13.6, anemia 11.3, normal platelet count. High-sensitivity troponin within normal limits Lipase within normal limits CMP shows AKI with creatinine at 1.77, normal BUN.  Gap of 19.  Glucose 337.  Bicarb decreased at 17.  Hyponatremia 131.  LFTs within normal limits. Urinalysis shows greater than 500 glucose, 5 ketones, no evidence for infection Pregnancy test negative.  DG Chest/Abd:  IMPRESSION:  1. Low lung volumes. Mild atelectasis and or scarring right mid lung  again noted.    2. Prominent amount of stool noted throughout the colon suggesting  constipation. No bowel distention noted.   CTAP:  IMPRESSION:  Again noted are areas of low-density scattered throughout the liver.  This is unchanged dating back to 01/31/2020. This likely reflects  geographic fatty infiltration, but could be further evaluated with  nonemergent MRI.    No acute findings in the abdomen or pelvis.   EKG: Sinus tachycardia Otherwise normal ECG Confirmed by Pattricia Boss [phone]) on 03/23/2021 9:05:18 AM --------------- Patient was initially ordered 1 L normal saline bolus for suspected dehydration due to nausea and vomiting.  Patient does have a history of diabetic gastroparesis which I feel is contributing to her symptoms today.  She does have an elevated anion gap and decreased bicarb along with hyperglycemia which could be suggestive of DKA however not clear at this point.  VBG and beta hydroxybutyric acid are currently pending.  The DKA order set was used with additional IV fluids, insulin,  dextrose and potassium for treatment.  Patient did report a history of CHF, she has no respiratory symptoms to suggest fluid overload at this time, I reviewed patient's most recent echocardiogram from July 2021 which showed EF of 55--60%.  Plan to give the lactated Ringer's more slowly, the 2 L simultaneous bolus was reduced to a 1 L bolus to avoid  fluid overload and will defer additional fluids to medicine team. - 1:02 PM: Consult with hospitalist Dr. Eliberto Ivory.  Agrees with continued lactated Ringer infusion at this point, patient admitted to medicine service. - Informed by nursing staff that they are having difficulty obtaining a second line, IV team is on the way to start a second line so that patient may also receive insulin and potassium along with IV fluids.  We will hold on insulin/potassium until second line is placed and just continue fluids for now or until medicine team evaluates patient and places further orders. - Patient reevaluated resting comfortably bed no acute distress much more comfortable since receiving nausea medication and Dilaudid states her symptoms are improving, she states understanding for admission.  Patient was seen and evaluated by Dr. Melina Copa during this visit who agrees with plan and management.  Note: Portions of this report may have been transcribed using voice recognition software. Every effort was made to ensure accuracy; however, inadvertent computerized transcription errors may still be present.  Final Clinical Impression(s) / ED Diagnoses Final diagnoses:  AKI (acute kidney injury) ([place])  Gastroparesis  Hyperglycemia    Rx / DC Orders ED Discharge Orders    None       Gari Crown 03/23/21 1442    Hayden Rasmussen, MD 03/23/21 2111

## 2021-03-23 NOTE — ED Notes (Signed)
RN attempted report x2 

## 2021-03-23 NOTE — ED Notes (Signed)
Unable to obtain 2nd line to start insulin.  PA notified - states not to give insulin push and to wait for IV team.

## 2021-03-23 NOTE — ED Provider Notes (Signed)
Emergency Medicine Provider Triage Evaluation Note  Jill Shaw , a 45 y.o. female  was evaluated in triage. She arrives by EMS.  Pt complains of abd, chest and leg pain.  They report patient has cried throughout transport.   Pt reports she has severe abd pain throughout her entire abdomen.  Pt reports associated vomiting.  No diarrhea.  Pt reports she gets this pain every 3 months.  Pt reports this is usually 2/2 to  "liver damage."  Pt denies treatment with pain medication.  Denies alcohol intake and drug use.    Review of Systems  Positive: abd pain, chest pain Negative: Fever, chills, diarrhea  Physical Exam  BP (!) 153/118 (BP Location: Left Arm)   Pulse (!) 121   Temp 98 F (36.7 C) (Oral)   Resp 20   Ht 5\' 4"  (1.626 m)   Wt 121.1 kg   SpO2 100%   BMI 45.83 kg/m  Gen:   Awake, moaning in triage Resp:  Normal effort  MSK:   Moves extremities without difficulty  Other:  TTP throughout the entire abd, tachycardia  Medical Decision Making  Medically screening exam initiated at 5:29 AM.  Appropriate orders placed.  Jill Shaw was informed that the remainder of the evaluation will be completed by another provider, this initial triage assessment does not replace that evaluation, and the importance of remaining in the ED until their evaluation is complete.  abd pain, chronic pain  EMS ECG:      [name] 03/23/21 05/23/21    [id number], MD 03/23/21 [phone]

## 2021-03-23 NOTE — ED Notes (Signed)
patient actively vomiting at triage and HR 120s. Patient appears uncomfortable

## 2021-03-24 DIAGNOSIS — E119 Type 2 diabetes mellitus without complications: Secondary | ICD-10-CM | POA: Diagnosis not present

## 2021-03-24 DIAGNOSIS — D638 Anemia in other chronic diseases classified elsewhere: Secondary | ICD-10-CM

## 2021-03-24 DIAGNOSIS — F321 Major depressive disorder, single episode, moderate: Secondary | ICD-10-CM | POA: Diagnosis not present

## 2021-03-24 DIAGNOSIS — E1110 Type 2 diabetes mellitus with ketoacidosis without coma: Secondary | ICD-10-CM | POA: Diagnosis not present

## 2021-03-24 DIAGNOSIS — Z794 Long term (current) use of insulin: Secondary | ICD-10-CM | POA: Diagnosis not present

## 2021-03-24 DIAGNOSIS — E1129 Type 2 diabetes mellitus with other diabetic kidney complication: Secondary | ICD-10-CM | POA: Diagnosis not present

## 2021-03-24 DIAGNOSIS — E1165 Type 2 diabetes mellitus with hyperglycemia: Secondary | ICD-10-CM

## 2021-03-24 DIAGNOSIS — I5032 Chronic diastolic (congestive) heart failure: Secondary | ICD-10-CM

## 2021-03-24 DIAGNOSIS — N182 Chronic kidney disease, stage 2 (mild): Secondary | ICD-10-CM

## 2021-03-24 DIAGNOSIS — IMO0002 Reserved for concepts with insufficient information to code with codable children: Secondary | ICD-10-CM

## 2021-03-24 LAB — COMPREHENSIVE METABOLIC PANEL
ALT: 19 U/L (ref 0–44)
AST: 28 U/L (ref 15–41)
Albumin: 3.7 g/dL (ref 3.5–5.0)
Alkaline Phosphatase: 75 U/L (ref 38–126)
Anion gap: 13 (ref 5–15)
BUN: 19 mg/dL (ref 6–20)
CO2: 23 mmol/L (ref 22–32)
Calcium: 9.4 mg/dL (ref 8.9–10.3)
Chloride: 96 mmol/L — ABNORMAL LOW (ref 98–111)
Creatinine, Ser: 1.52 mg/dL — ABNORMAL HIGH (ref 0.44–1.00)
GFR, Estimated: 43 mL/min — ABNORMAL LOW (ref >60)
Glucose, Bld: 288 mg/dL — ABNORMAL HIGH (ref 70–99)
Potassium: 4.4 mmol/L (ref 3.5–5.1)
Sodium: 132 mmol/L — ABNORMAL LOW (ref 135–145)
Total Bilirubin: 0.7 mg/dL (ref 0.3–1.2)
Total Protein: 6.8 g/dL (ref 6.5–8.1)

## 2021-03-24 LAB — CBC
HCT: 32.1 % — ABNORMAL LOW (ref 36.0–46.0)
Hemoglobin: 9.9 g/dL — ABNORMAL LOW (ref 12.0–15.0)
MCH: 20.5 pg — ABNORMAL LOW (ref 26.0–34.0)
MCHC: 30.8 g/dL (ref 30.0–36.0)
MCV: 66.6 fL — ABNORMAL LOW (ref 80.0–100.0)
Platelets: 313 10*3/uL (ref 150–400)
RBC: 4.82 MIL/uL (ref 3.87–5.11)
RDW: 24.2 % — ABNORMAL HIGH (ref 11.5–15.5)
WBC: 12.8 10*3/uL — ABNORMAL HIGH (ref 4.0–10.5)
nRBC: 0 % (ref 0.0–0.2)

## 2021-03-24 LAB — GLUCOSE, CAPILLARY
Glucose-Capillary: 291 mg/dL — ABNORMAL HIGH (ref 70–99)
Glucose-Capillary: 224 mg/dL — ABNORMAL HIGH (ref 70–99)
Glucose-Capillary: 123 mg/dL — ABNORMAL HIGH (ref 70–99)
Glucose-Capillary: 257 mg/dL — ABNORMAL HIGH (ref 70–99)

## 2021-03-24 LAB — RHEUMATOID FACTOR: Rheumatoid fact SerPl-aCnc: <10 IU/mL (ref <14.0)

## 2021-03-24 LAB — BETA-HYDROXYBUTYRIC ACID: Beta-Hydroxybutyric Acid: 0.27 mmol/L (ref 0.05–0.27)

## 2021-03-24 MED ORDER — INSULIN GLARGINE 100 UNIT/ML ~~LOC~~ SOLN
60.0000 [IU] | Freq: Once | SUBCUTANEOUS | Status: AC
  Administered 2021-03-24: 60 [IU] via SUBCUTANEOUS
  Filled 2021-03-24: qty 0.6

## 2021-03-24 MED ORDER — PANTOPRAZOLE SODIUM 40 MG PO TBEC
40.0000 mg | DELAYED_RELEASE_TABLET | Freq: Every day | ORAL | Status: DC
  Filled 2021-03-24: qty 1

## 2021-03-24 MED ORDER — NICOTINE 14 MG/24HR TD PT24: 14.0000 mg | MEDICATED_PATCH | Freq: Every day | TRANSDERMAL | Status: DC

## 2021-03-24 MED ORDER — BISACODYL 5 MG PO TBEC
10.0000 mg | DELAYED_RELEASE_TABLET | Freq: Once | ORAL | Status: AC
  Administered 2021-03-24: 10 mg via ORAL
  Filled 2021-03-24: qty 2

## 2021-03-24 MED ORDER — NICOTINE 7 MG/24HR TD PT24: 7.0000 mg | MEDICATED_PATCH | Freq: Every day | TRANSDERMAL | Status: DC

## 2021-03-24 MED ORDER — NICOTINE 7 MG/24HR TD PT24: 7.0000 mg | MEDICATED_PATCH | Freq: Every day | TRANSDERMAL | 0 refills | Status: DC

## 2021-03-24 MED ORDER — SPIRONOLACTONE 25 MG PO TABS
50.0000 mg | ORAL_TABLET | Freq: Every day | ORAL | Status: DC
  Administered 2021-03-24: 50 mg via ORAL
  Filled 2021-03-24: qty 2

## 2021-03-24 MED ORDER — SENNOSIDES-DOCUSATE SODIUM 8.6-50 MG PO TABS: 1.0000 | ORAL_TABLET | Freq: Every evening | ORAL | 0 refills | Status: DC | PRN

## 2021-03-24 MED ORDER — NICOTINE 14 MG/24HR TD PT24: 14.0000 mg | MEDICATED_PATCH | Freq: Every day | TRANSDERMAL | 0 refills | Status: AC

## 2021-03-24 MED ORDER — INSULIN GLARGINE 100 UNIT/ML ~~LOC~~ SOLN: 110.0000 [IU] | Freq: Every day | SUBCUTANEOUS | Status: DC

## 2021-03-24 MED ORDER — INSULIN GLARGINE 100 UNIT/ML ~~LOC~~ SOLN
50.0000 [IU] | Freq: Two times a day (BID) | SUBCUTANEOUS | Status: DC
  Administered 2021-03-24: 50 [IU] via SUBCUTANEOUS

## 2021-03-24 MED ORDER — TIZANIDINE HCL 4 MG PO TABS
4.0000 mg | ORAL_TABLET | Freq: Three times a day (TID) | ORAL | Status: DC
  Administered 2021-03-24 (×3): 4 mg via ORAL
  Filled 2021-03-24 (×3): qty 1

## 2021-03-24 NOTE — Discharge Summary (Signed)
Physician Discharge Summary  Jill Shaw QMG:[id number] DOB: [birth year] DOA: 03/23/2021  PCP: Vevelyn Francois, NP  Admit date: 03/23/2021 Discharge date: 03/24/2021  Admitted From: home  Disposition:  home   Recommendations for Outpatient Follow-up:  F/u on diabetes control- her A!c has risen from the last one we have in the computer  Craig:  none  Discharge Condition:  stable   CODE STATUS:  full code   Diet recommendation:  carb modified, heart healthy Consultations: none  Procedures/Studies: none   Discharge Diagnoses:  Principal Problem:   DKA (diabetic ketoacidosis) ([place]) Active Problems:   Intractable nausea and vomiting   Knee pain, chronic   Essential hypertension   Obesity, Class III, BMI 40-49.9 (morbid obesity) ([place])   Tobacco dependence   Anemia of chronic disease   Chronic obstructive pulmonary disease (HCC)   Lactic acidosis   Generalized abdominal pain   Chronic diastolic CHF (congestive heart failure) (HCC)   Current moderate episode of major depressive disorder without prior episode ([place])   DM (diabetes mellitus), type 2, uncontrolled, with renal complications ([place])     Brief Summary: This is a 45 year old female with uncontrolled diabetes mellitus, hypertension, COPD, anemia, sickle cell trait, dyslipidemia and depression who presented to the hospital with nausea vomiting and chest pain.  She states she vomited about 20 times and eventually was dry heaving. In the ED she was felt to be in DKA and dehydrated with mild lactic acidosis.  She was given fluid boluses and started on an insulin infusion.    Hospital Course:  DKA with uncontrolled diabetes mellitus -CBG 373, bicarb 17 with an anion gap of 19-started on an insulin infusion and transitioned off once her anion gap had closed -No longer acidotic today - CO2 is 23 -A1c on March 10 of this year was 9.7-it is now 10.7 -States she was recently started on Lantus and NovoLog and they are trying  to find the right dose for her-I have advised her to continue to check her sugars carefully and follow-up with her endocrinologist if her sugars are elevated    Intractable nausea vomiting and abdominal pain Dehydration with hyponatremia -This has resolved and she has been able to tolerate 2 meals today-IV fluids have been discontinued  AKI with chronic kidney disease stage II - Her creatinine seems to range around 0.9-1.2 -It was 1.7 when admitted and has improved to 1.52-can be followed as outpatient  Nicotine abuse - she has been on 21 mg patches for weeks  - I have written prescriptions for a taper   Discharge Exam: Vitals:   03/24/21 0832 03/24/21 1340  BP: (!) 155/84 131/76  Pulse: (!) 114 89  Resp: 18 16  Temp: 97.6 F (36.4 C) 98.6 F (37 C)  SpO2: 99% 100%   Vitals:   03/24/21 0554 03/24/21 0622 03/24/21 0832 03/24/21 1340  BP: (!) 157/79  (!) 155/84 131/76  Pulse: (!) 114  (!) 114 89  Resp: _0 Temp: 98.5 F (36.9 C)  97.6 F (36.4 C) 98.6 F (37 C)  TempSrc:   Oral   SpO2: 98%  99% 100%  Weight:  118.6 kg    Height:        General: Pt is alert, awake, not in acute distress Cardiovascular: RRR, S1/S2 +, no rubs, no gallops Respiratory: CTA bilaterally, no wheezing, no rhonchi Abdominal: Soft, NT, ND, bowel sounds + Extremities: no edema, no cyanosis   Discharge Instructions  Discharge Instructions  Diet - low sodium heart healthy   Complete by: As directed    Diet Carb Modified   Complete by: As directed    Increase activity slowly   Complete by: As directed       Allergies as of 03/24/2021       Reactions   Ketoprofen Nausea And Vomiting   Aspirin Nausea Only   Gabapentin Nausea And Vomiting, Other (See Comments)   upset stomach   Ibuprofen Nausea And Vomiting   Liraglutide Nausea And Vomiting   Naproxen Nausea And Vomiting   Omeprazole-sodium Bicarbonate Nausea And Vomiting   Sulfa Antibiotics Nausea And Vomiting   Tramadol  Nausea And Vomiting, Other (See Comments)   stomach upset        Medication List     STOP taking these medications    nicotine 21 mg/24hr patch Commonly known as: Nicoderm CQ Replaced by: nicotine 14 mg/24hr patch   omeprazole 40 MG capsule Commonly known as: PRILOSEC       TAKE these medications    Accu-Chek Softclix Lancets lancets USE AS DIRECTED UP TO FOUR TIMES DAILY   albuterol (2.5 MG/3ML) 0.083% nebulizer solution Commonly known as: PROVENTIL Take 3 mLs (2.5 mg total) by nebulization every 6 (six) hours as needed for wheezing or shortness of breath.   albuterol 108 (90 Base) MCG/ACT inhaler Commonly known as: VENTOLIN HFA Inhale 2 puffs into the lungs every 6 (six) hours as needed for wheezing or shortness of breath.   blood glucose meter kit and supplies Kit 1 each by Other route See admin instructions. Dispense based on patient and insurance preference. Use up to four times daily as directed. (FOR ICD-9 250.00, 250.01).   cetirizine 10 MG tablet Commonly known as: ZYRTEC Take 10 mg by mouth daily.   diltiazem 180 MG 24 hr capsule Commonly known as: CARDIZEM CD Take 180 mg by mouth daily.   empagliflozin 10 MG Tabs tablet Commonly known as: Jardiance Take 1 tablet (10 mg total) by mouth daily before breakfast.   ferrous sulfate 325 (65 FE) MG tablet Take 1 tablet (325 mg total) by mouth 3 (three) times daily with meals.   fluticasone 50 MCG/ACT nasal spray Commonly known as: FLONASE Place 2 sprays into both nostrils daily.   Heating Pad Moist/Dry King Sz Pads 1 each by Does not apply route 4 (four) times daily as needed.   hydrochlorothiazide 12.5 MG capsule Commonly known as: MICROZIDE Take 1 capsule (12.5 mg total) by mouth daily.   insulin lispro 100 UNIT/ML KwikPen Commonly known as: HumaLOG KwikPen Inject 40 Units into the skin 3 (three) times daily.   Insulin Pen Needle 31G X 8 MM Misc 1 Device by Does not apply route in the morning,  at noon, in the evening, and at bedtime.   Lantus SoloStar 100 UNIT/ML Solostar Pen Generic drug: insulin glargine Inject 110 Units into the skin daily.   methocarbamol 500 MG tablet Commonly known as: ROBAXIN Take 500 mg by mouth every 6 (six) hours as needed for muscle spasms.   metoprolol succinate 100 MG 24 hr tablet Commonly known as: TOPROL-XL Take 100 mg by mouth daily. Take with or immediately following a meal.   nicotine 14 mg/24hr patch Commonly known as: NICODERM CQ - dosed in mg/24 hours Place 1 patch (14 mg total) onto the skin daily for 21 days. Start taking on: April 14, 2021 Replaces: nicotine 21 mg/24hr patch   nicotine 7 mg/24hr patch Commonly known as: Nicoderm CQ  Place 1 patch (7 mg total) onto the skin daily. Start only when the 14 mg patches are finished. Start taking on: April 15, 2021   ondansetron 4 MG disintegrating tablet Commonly known as: Zofran ODT Take 1 tablet (4 mg total) by mouth every 8 (eight) hours as needed for nausea or vomiting.   pantoprazole 40 MG tablet Commonly known as: Protonix Take 1 tablet (40 mg total) by mouth daily.   potassium chloride SA 20 MEQ tablet Commonly known as: KLOR-CON Take 20 mEq by mouth daily.   rosuvastatin 5 MG tablet Commonly known as: Crestor Take 1 tablet (5 mg total) by mouth daily.   saxagliptin HCl 5 MG Tabs tablet Commonly known as: Onglyza Take 1 tablet (5 mg total) by mouth daily.   senna-docusate 8.6-50 MG tablet Commonly known as: Senokot-S Take 1 tablet by mouth at bedtime as needed for mild constipation.   Spiriva Respimat 2.5 MCG/ACT Aers Generic drug: Tiotropium Bromide Monohydrate Inhale 2 puffs into the lungs daily.   tiZANidine 4 MG capsule Commonly known as: ZANAFLEX Take 4 mg by mouth 3 (three) times daily.   topiramate 25 MG tablet Commonly known as: Topamax Take 1 tablet (25 mg total) by mouth 2 (two) times daily.   traZODone 100 MG tablet Commonly known as:  DESYREL Take 100 mg by mouth at bedtime.   vitamin E 180 MG (400 UNITS) capsule Commonly known as: vitamin E Take 1 capsule (400 Units total) by mouth daily.       ASK your doctor about these medications    Accu-Chek Guide test strip Generic drug: glucose blood USE AS DIRECTED UP TO FOUR TIMES DAILY   budesonide-formoterol 160-4.5 MCG/ACT inhaler Commonly known as: Symbicort INHALE 2 PUFFS INTO THE LUNGS 2 (TWO) TIMES DAILY.   furosemide 40 MG tablet Commonly known as: LASIX TAKE 1 TABLET(40 MG) BY MOUTH TWICE DAILY   Glucosamine Sulfate 1000 MG Caps Take 1 capsule (1,000 mg total) by mouth 2 (two) times daily.   HYDROcodone-acetaminophen 10-325 MG tablet Commonly known as: NORCO Take 1 tablet by mouth 2 (two) times daily as needed.   hydrocortisone 2.5 % cream Apply topically 2 (two) times daily.   hydroquinone 4 % cream APPLY TOPICALLY TWICE DAILY   losartan 50 MG tablet Commonly known as: COZAAR Take 1 tablet (50 mg total) by mouth daily. TAKE 1 TABLET($RemoveBefor'50MG'NCcStKHRcFSM$  TOTAL) BY MOUTH EVERY DAY   melatonin 3 MG Tabs tablet Take 1 tablet (3 mg total) by mouth at bedtime as needed.   norethindrone 5 MG tablet Commonly known as: Aygestin Take 2 tablets (10 mg total) by mouth in the morning, at noon, in the evening, and at bedtime. With bleeding   spironolactone 50 MG tablet Commonly known as: ALDACTONE TAKE 1 TABLET(50 MG) BY MOUTH DAILY   Vitamin D (Ergocalciferol) 1.25 MG (50000 UNIT) Caps capsule Commonly known as: DRISDOL Take 1 capsule (50,000 Units total) by mouth every 7 (seven) days.        Allergies  Allergen Reactions   Ketoprofen Nausea And Vomiting   Aspirin Nausea Only   Gabapentin Nausea And Vomiting and Other (See Comments)    upset stomach   Ibuprofen Nausea And Vomiting   Liraglutide Nausea And Vomiting   Naproxen Nausea And Vomiting   Omeprazole-Sodium Bicarbonate Nausea And Vomiting   Sulfa Antibiotics Nausea And Vomiting   Tramadol Nausea  And Vomiting and Other (See Comments)    stomach upset      CT ABDOMEN PELVIS  WO CONTRAST  Result Date: 03/23/2021 CLINICAL DATA:  Abdominal pain EXAM: CT ABDOMEN AND PELVIS WITHOUT CONTRAST TECHNIQUE: Multidetector CT imaging of the abdomen and pelvis was performed following the standard protocol without IV contrast. COMPARISON:  12/23/2020, 01/31/2020 FINDINGS: Lower chest: No acute abnormality. Hepatobiliary: Areas of low-density again noted throughout the liver are unchanged since prior study, likely geographic changes of fatty infiltration. Gallbladder unremarkable. Pancreas: No focal abnormality or ductal dilatation. Spleen: No focal abnormality.  Normal size. Adrenals/Urinary Tract: No adrenal abnormality. No focal renal abnormality. No stones or hydronephrosis. Urinary bladder is unremarkable. Stomach/Bowel: Normal appendix. Stomach, large and small bowel grossly unremarkable. Vascular/Lymphatic: No evidence of aneurysm or adenopathy. Reproductive: Uterus and adnexa unremarkable.  No mass. Other: No free fluid or free air. Musculoskeletal: No acute bony abnormality. IMPRESSION: Again noted are areas of low-density scattered throughout the liver. This is unchanged dating back to 01/31/2020. This likely reflects geographic fatty infiltration, but could be further evaluated with nonemergent MRI. No acute findings in the abdomen or pelvis. Electronically Signed   By: Rolm Baptise M.D.   On: 03/23/2021 12:26   DG Knee 1-2 Views Left  Result Date: 03/23/2021 CLINICAL DATA:  Right and left knee pain. EXAM: LEFT KNEE - 1-2 VIEW COMPARISON:  08/27/2020 FINDINGS: No evidence of fracture, dislocation, or joint effusion. Trace superior patellar spurring is similar to prior. Normal joint spaces. No erosion or bone destruction. Soft tissues are unremarkable. IMPRESSION: Minimal patellofemoral degenerative spurring. Electronically Signed   By: Keith Rake M.D.   On: 03/23/2021 18:11   DG Knee 1-2 Views  Right  Result Date: 03/23/2021 CLINICAL DATA:  Right and left knee pain. EXAM: RIGHT KNEE - 1-2 VIEW COMPARISON:  08/27/2020 FINDINGS: No evidence of fracture, dislocation, or joint effusion. Minimal superior patellar spurring. Joint spaces are preserved. No erosion or bone destruction. Soft tissues are unremarkable. IMPRESSION: Minimal superior patellar spurring. Electronically Signed   By: Keith Rake M.D.   On: 03/23/2021 18:13   CT HEAD WO CONTRAST  Result Date: 03/14/2021 CLINICAL DATA:  Possible drug overdose unresponsive EXAM: CT HEAD WITHOUT CONTRAST TECHNIQUE: Contiguous axial images were obtained from the base of the skull through the vertex without intravenous contrast. COMPARISON:  CT 01/08/2021 FINDINGS: Brain: No evidence of acute infarction, hemorrhage, hydrocephalus, extra-axial collection or mass lesion/mass effect. Vascular: No hyperdense vessel or unexpected calcification. Skull: Normal. Negative for fracture or focal lesion. Sinuses/Orbits: No acute finding. Other: None. IMPRESSION: Negative non contrasted CT appearance of the brain Electronically Signed   By: Donavan Foil M.D.   On: 03/14/2021 19:31   DG Chest Port 1 View  Result Date: 03/14/2021 CLINICAL DATA:  Suspected overdose. EXAM: PORTABLE CHEST 1 VIEW COMPARISON:  November 12, 2020 FINDINGS: Cardiomediastinal silhouette is normal. Mediastinal contours appear intact. There is no evidence of focal airspace consolidation, pleural effusion or pneumothorax. Scarring versus atelectasis in the right mid lung field. Low lung volumes. Osseous structures are without acute abnormality. Soft tissues are grossly normal. IMPRESSION: 1. Low lung volumes. 2. Scarring versus atelectasis in the right mid lung field. Electronically Signed   By: Fidela Salisbury M.D.   On: 03/14/2021 19:25   DG Abdomen Acute W/Chest  Result Date: 03/23/2021 CLINICAL DATA:  Abdominal and chest pain. EXAM: DG ABDOMEN ACUTE WITH 1 VIEW CHEST COMPARISON:   Chest x-ray 03/14/2021.  Abdomen 01/10/2021. FINDINGS: Mediastinum hilar structures normal. Heart size normal. Low lung volumes. Mild atelectasis and or scarring right mid lung again noted. No pleural effusion  or pneumothorax. Soft tissues the abdomen are unremarkable. Pelvic calcifications consistent phleboliths. No bowel distention. Prominent amount of stool noted throughout the colon. Constipation could present in this fashion. No free air. No acute bony abnormality identified. IMPRESSION: 1. Low lung volumes. Mild atelectasis and or scarring right mid lung again noted. 2. Prominent amount of stool noted throughout the colon suggesting constipation. No bowel distention noted. Electronically Signed   By: Marcello Moores  Register   On: 03/23/2021 07:47   EEG adult  Result Date: 03/15/2021 Lora Havens, MD     03/15/2021  4:15 PM Patient Name: Jill Shaw MRN: [id number] Epilepsy Attending: Lora Havens Referring Physician/Provider: Dr Kerney Elbe Date: 03/15/2021 Duration: Patient history: 45 year old female presenting with AMS and autonomic changes after probable overuse of her recently prescribed SSRI. EEG to evaluate for seizure. Level of alertness: Awake AEDs during EEG study: None Technical aspects: This EEG study was done with scalp electrodes positioned according to the 10-20 International system of electrode placement. Electrical activity was acquired at a sampling rate of _0  and reviewed with a high frequency filter of _1  and a low frequency filter of _2 . EEG data were recorded continuously and digitally stored. Description: The posterior dominant rhythm consists of 8-9 Hz activity of moderate voltage (25-35 uV) seen predominantly in posterior head regions, symmetric and reactive to eye opening and eye closing. Hyperventilation and photic stimulation were not performed.   IMPRESSION: This study is within normal limits. No seizures or epileptiform discharges were seen throughout the recording.  Lora Havens     The results of significant diagnostics from this hospitalization (including imaging, microbiology, ancillary and laboratory) are listed below for reference.     Microbiology: Recent Results (from the past 240 hour(s))  SARS CORONAVIRUS 2 (TAT 6-24 HRS) Nasopharyngeal Nasopharyngeal Swab     Status: None   Collection Time: 03/16/21  3:52 AM   Specimen: Nasopharyngeal Swab  Result Value Ref Range Status   SARS Coronavirus 2 NEGATIVE NEGATIVE Final    Comment: (NOTE) SARS-CoV-2 target nucleic acids are NOT DETECTED.  The SARS-CoV-2 RNA is generally detectable in upper and lower respiratory specimens during the acute phase of infection. Negative results do not preclude SARS-CoV-2 infection, do not rule out co-infections with other pathogens, and should not be used as the sole basis for treatment or other patient management decisions. Negative results must be combined with clinical observations, patient history, and epidemiological information. The expected result is Negative.  Fact Sheet for Patients: SugarRoll.be  Fact Sheet for Healthcare Providers: https://www.woods-mathews.com/  This test is not yet approved or cleared by the [place] FDA and  has been authorized for detection and/or diagnosis of SARS-CoV-2 by FDA under an Emergency Use Authorization (EUA). This EUA will remain  in effect (meaning this test can be used) for the duration of the COVID-19 declaration under Se ction 564(b)(1) of the Act, 21 U.S.C. section 360bbb-3(b)(1), unless the authorization is terminated or revoked sooner.  Performed at [facility], [place] [address] [place], [place] [address]   Blood culture (routine x 2)     Status: None (Preliminary result)   Collection Time: 03/23/21 10:28 AM   Specimen: BLOOD  Result Value Ref Range Status   Specimen Description BLOOD RIGHT ANTECUBITAL  Final   Special Requests   Final     BOTTLES DRAWN AEROBIC AND ANAEROBIC Blood Culture results may not be optimal due to an inadequate volume of blood received in culture bottles  Culture   Final    NO GROWTH < 24 HOURS Performed at [facility], [place] [address], [place], [place] [address]    Report Status PENDING  Incomplete  Blood culture (routine x 2)     Status: None (Preliminary result)   Collection Time: 03/23/21 10:41 AM   Specimen: BLOOD RIGHT HAND  Result Value Ref Range Status   Specimen Description BLOOD RIGHT HAND  Final   Special Requests   Final    BOTTLES DRAWN AEROBIC AND ANAEROBIC Blood Culture results may not be optimal due to an inadequate volume of blood received in culture bottles   Culture   Final    NO GROWTH < 24 HOURS Performed at [facility], [place] [address] [place], [place] [address]    Report Status PENDING  Incomplete  Resp Panel by RT-PCR (Flu A&B, Covid) Nasopharyngeal Swab     Status: None   Collection Time: 03/23/21 12:37 PM   Specimen: Nasopharyngeal Swab; Nasopharyngeal(NP) swabs in vial transport medium  Result Value Ref Range Status   SARS Coronavirus 2 by RT PCR NEGATIVE NEGATIVE Final    Comment: (NOTE) SARS-CoV-2 target nucleic acids are NOT DETECTED.  The SARS-CoV-2 RNA is generally detectable in upper respiratory specimens during the acute phase of infection. The lowest concentration of SARS-CoV-2 viral copies this assay can detect is 138 copies/mL. A negative result does not preclude SARS-Cov-2 infection and should not be used as the sole basis for treatment or other patient management decisions. A negative result may occur with  improper specimen collection/handling, submission of specimen other than nasopharyngeal swab, presence of viral mutation(s) within the areas targeted by this assay, and inadequate number of viral copies(<138 copies/mL). A negative result must be combined with clinical observations, patient history, and epidemiological information. The  expected result is Negative.  Fact Sheet for Patients:  EntrepreneurPulse.com.au  Fact Sheet for Healthcare Providers:  IncredibleEmployment.be  This test is no t yet approved or cleared by the [place] FDA and  has been authorized for detection and/or diagnosis of SARS-CoV-2 by FDA under an Emergency Use Authorization (EUA). This EUA will remain  in effect (meaning this test can be used) for the duration of the COVID-19 declaration under Section 564(b)(1) of the Act, 21 U.S.C.section 360bbb-3(b)(1), unless the authorization is terminated  or revoked sooner.       Influenza A by PCR NEGATIVE NEGATIVE Final   Influenza B by PCR NEGATIVE NEGATIVE Final    Comment: (NOTE) The Xpert Xpress SARS-CoV-2/FLU/RSV plus assay is intended as an aid in the diagnosis of influenza from Nasopharyngeal swab specimens and should not be used as a sole basis for treatment. Nasal washings and aspirates are unacceptable for Xpert Xpress SARS-CoV-2/FLU/RSV testing.  Fact Sheet for Patients: EntrepreneurPulse.com.au  Fact Sheet for Healthcare Providers: IncredibleEmployment.be  This test is not yet approved or cleared by the [place] FDA and has been authorized for detection and/or diagnosis of SARS-CoV-2 by FDA under an Emergency Use Authorization (EUA). This EUA will remain in effect (meaning this test can be used) for the duration of the COVID-19 declaration under Section 564(b)(1) of the Act, 21 U.S.C. section 360bbb-3(b)(1), unless the authorization is terminated or revoked.  Performed at [facility], [place] [address], [place], [place] [address]      Labs: BNP (last 3 results) Recent Labs    06/22/20 0636 09/20/20 1220 11/20/20 1034  BNP 266.3* 95.8 50.5   Basic Metabolic Panel: Recent Labs  Lab 03/23/21 0614 03/23/21 1406 03/23/21 1839 03/24/21 0050  NA 131* 133* 133* 132*  K 4.6 5.4* 4.4  4.4  CL 95* 99 99 96*  CO2 17* 21* 23 23  GLUCOSE 373* 371* 299* 288*  BUN _0 CREATININE 1.77* 1.49* 1.48* 1.52*  CALCIUM 9.6 9.4 9.3 9.4   Liver Function Tests: Recent Labs  Lab 03/23/21 0614 03/24/21 0050  AST 38 28  ALT 15 19  ALKPHOS 98 75  BILITOT 1.0 0.7  PROT 7.9 6.8  ALBUMIN 4.1 3.7   Recent Labs  Lab 03/23/21 0614  LIPASE 26   No results for input(s): AMMONIA in the last 168 hours. CBC: Recent Labs  Lab 03/23/21 0614 03/24/21 0050  WBC 13.6* 12.8*  NEUTROABS 9.8*  --   HGB 11.3* 9.9*  HCT 36.8 32.1*  MCV 66.5* 66.6*  PLT 352 313   Cardiac Enzymes: No results for input(s): CKTOTAL, CKMB, CKMBINDEX, TROPONINI in the last 168 hours. BNP: Invalid input(s): POCBNP CBG: Recent Labs  Lab 03/23/21 1703 03/23/21 2245 03/24/21 0722 03/24/21 1113 03/24/21 1624  GLUCAP 251* 318* 291* 224* 123*   D-Dimer No results for input(s): DDIMER in the last 72 hours. Hgb A1c No results for input(s): HGBA1C in the last 72 hours. Lipid Profile No results for input(s): CHOL, HDL, LDLCALC, TRIG, CHOLHDL, LDLDIRECT in the last 72 hours. Thyroid function studies No results for input(s): TSH, T4TOTAL, T3FREE, THYROIDAB in the last 72 hours.  Invalid input(s): FREET3 Anemia work up No results for input(s): VITAMINB12, FOLATE, FERRITIN, TIBC, IRON, RETICCTPCT in the last 72 hours. Urinalysis    Component Value Date/Time   COLORURINE YELLOW 03/23/2021 0534   APPEARANCEUR CLEAR 03/23/2021 0534   LABSPEC 1.026 03/23/2021 0534   PHURINE 5.0 03/23/2021 0534   GLUCOSEU >=500 (A) 03/23/2021 0534   HGBUR NEGATIVE 03/23/2021 0534   BILIRUBINUR NEGATIVE 03/23/2021 0534   BILIRUBINUR negative 06/02/2020 1419   BILIRUBINUR NEGATIVE 11/05/2019 1342   KETONESUR 5 (A) 03/23/2021 0534   PROTEINUR NEGATIVE 03/23/2021 0534   UROBILINOGEN 0.2 11/12/2020 1148   NITRITE NEGATIVE 03/23/2021 0534   LEUKOCYTESUR NEGATIVE 03/23/2021 0534   Sepsis Labs Invalid input(s):  PROCALCITONIN,  WBC,  LACTICIDVEN Microbiology Recent Results (from the past 240 hour(s))  SARS CORONAVIRUS 2 (TAT 6-24 HRS) Nasopharyngeal Nasopharyngeal Swab     Status: None   Collection Time: 03/16/21  3:52 AM   Specimen: Nasopharyngeal Swab  Result Value Ref Range Status   SARS Coronavirus 2 NEGATIVE NEGATIVE Final    Comment: (NOTE) SARS-CoV-2 target nucleic acids are NOT DETECTED.  The SARS-CoV-2 RNA is generally detectable in upper and lower respiratory specimens during the acute phase of infection. Negative results do not preclude SARS-CoV-2 infection, do not rule out co-infections with other pathogens, and should not be used as the sole basis for treatment or other patient management decisions. Negative results must be combined with clinical observations, patient history, and epidemiological information. The expected result is Negative.  Fact Sheet for Patients: SugarRoll.be  Fact Sheet for Healthcare Providers: https://www.woods-mathews.com/  This test is not yet approved or cleared by the [place] FDA and  has been authorized for detection and/or diagnosis of SARS-CoV-2 by FDA under an Emergency Use Authorization (EUA). This EUA will remain  in effect (meaning this test can be used) for the duration of the COVID-19 declaration under Se ction 564(b)(1) of the Act, 21 U.S.C. section 360bbb-3(b)(1), unless the authorization is terminated or revoked sooner.  Performed at  [facility], [place] [address], [place], [place] [address]   Blood culture (routine x 2)     Status: None (Preliminary result)   Collection Time: 03/23/21 10:28 AM   Specimen: BLOOD  Result Value Ref Range Status   Specimen Description BLOOD RIGHT ANTECUBITAL  Final   Special Requests   Final    BOTTLES DRAWN AEROBIC AND ANAEROBIC Blood Culture results may not be optimal due to an inadequate volume of blood received in culture bottles   Culture    Final    NO GROWTH < 24 HOURS Performed at [facility], [place] [address] [place], [place] [address]    Report Status PENDING  Incomplete  Blood culture (routine x 2)     Status: None (Preliminary result)   Collection Time: 03/23/21 10:41 AM   Specimen: BLOOD RIGHT HAND  Result Value Ref Range Status   Specimen Description BLOOD RIGHT HAND  Final   Special Requests   Final    BOTTLES DRAWN AEROBIC AND ANAEROBIC Blood Culture results may not be optimal due to an inadequate volume of blood received in culture bottles   Culture   Final    NO GROWTH < 24 HOURS Performed at [facility], [place] [address], [place], [place] [address]    Report Status PENDING  Incomplete  Resp Panel by RT-PCR (Flu A&B, Covid) Nasopharyngeal Swab     Status: None   Collection Time: 03/23/21 12:37 PM   Specimen: Nasopharyngeal Swab; Nasopharyngeal(NP) swabs in vial transport medium  Result Value Ref Range Status   SARS Coronavirus 2 by RT PCR NEGATIVE NEGATIVE Final    Comment: (NOTE) SARS-CoV-2 target nucleic acids are NOT DETECTED.  The SARS-CoV-2 RNA is generally detectable in upper respiratory specimens during the acute phase of infection. The lowest concentration of SARS-CoV-2 viral copies this assay can detect is 138 copies/mL. A negative result does not preclude SARS-Cov-2 infection and should not be used as the sole basis for treatment or other patient management decisions. A negative result may occur with  improper specimen collection/handling, submission of specimen other than nasopharyngeal swab, presence of viral mutation(s) within the areas targeted by this assay, and inadequate number of viral copies(<138 copies/mL). A negative result must be combined with clinical observations, patient history, and epidemiological information. The expected result is Negative.  Fact Sheet for Patients:  EntrepreneurPulse.com.au  Fact Sheet for Healthcare Providers:   IncredibleEmployment.be  This test is no t yet approved or cleared by the [place] FDA and  has been authorized for detection and/or diagnosis of SARS-CoV-2 by FDA under an Emergency Use Authorization (EUA). This EUA will remain  in effect (meaning this test can be used) for the duration of the COVID-19 declaration under Section 564(b)(1) of the Act, 21 U.S.C.section 360bbb-3(b)(1), unless the authorization is terminated  or revoked sooner.       Influenza A by PCR NEGATIVE NEGATIVE Final   Influenza B by PCR NEGATIVE NEGATIVE Final    Comment: (NOTE) The Xpert Xpress SARS-CoV-2/FLU/RSV plus assay is intended as an aid in the diagnosis of influenza from Nasopharyngeal swab specimens and should not be used as a sole basis for treatment. Nasal washings and aspirates are unacceptable for Xpert Xpress SARS-CoV-2/FLU/RSV testing.  Fact Sheet for Patients: EntrepreneurPulse.com.au  Fact Sheet for Healthcare Providers: IncredibleEmployment.be  This test is not yet approved or cleared by the [place] FDA and has been authorized for detection and/or diagnosis of SARS-CoV-2 by FDA under  an Emergency Use Authorization (EUA). This EUA will remain in effect (meaning this test can be used) for the duration of the COVID-19 declaration under Section 564(b)(1) of the Act, 21 U.S.C. section 360bbb-3(b)(1), unless the authorization is terminated or revoked.  Performed at [facility], [place] [address], [place], [place] [address]      Time coordinating discharge in minutes: 65  SIGNED:   Debbe Odea, MD  [facility] 03/24/2021, 4:33 PM

## 2021-03-25 ENCOUNTER — Ambulatory Visit: Payer: Medicaid Other | Admitting: Student

## 2021-03-28 LAB — CULTURE, BLOOD (ROUTINE X 2)
Culture: NO GROWTH
Culture: NO GROWTH

## 2021-03-30 ENCOUNTER — Other Ambulatory Visit: Payer: Self-pay | Admitting: Physical Medicine and Rehabilitation

## 2021-03-30 ENCOUNTER — Other Ambulatory Visit: Payer: Self-pay

## 2021-03-30 ENCOUNTER — Encounter: Payer: Medicaid Other | Admitting: Registered Nurse

## 2021-03-30 ENCOUNTER — Ambulatory Visit: Payer: Medicaid Other | Admitting: Pulmonary Disease

## 2021-03-30 VITALS — BP 126/86 | HR 120 | Temp 98.9°F | Ht 64.0 in | Wt 251.6 lb

## 2021-03-30 DIAGNOSIS — G4701 Insomnia due to medical condition: Secondary | ICD-10-CM | POA: Diagnosis present

## 2021-03-30 DIAGNOSIS — R000 Tachycardia, unspecified: Secondary | ICD-10-CM

## 2021-03-30 DIAGNOSIS — Z794 Long term (current) use of insulin: Secondary | ICD-10-CM | POA: Diagnosis present

## 2021-03-30 DIAGNOSIS — Z5181 Encounter for therapeutic drug level monitoring: Secondary | ICD-10-CM | POA: Diagnosis not present

## 2021-03-30 DIAGNOSIS — M5416 Radiculopathy, lumbar region: Secondary | ICD-10-CM | POA: Diagnosis not present

## 2021-03-30 DIAGNOSIS — G894 Chronic pain syndrome: Secondary | ICD-10-CM

## 2021-03-30 DIAGNOSIS — G629 Polyneuropathy, unspecified: Secondary | ICD-10-CM

## 2021-03-30 DIAGNOSIS — Z79899 Other long term (current) drug therapy: Secondary | ICD-10-CM

## 2021-03-30 DIAGNOSIS — M797 Fibromyalgia: Secondary | ICD-10-CM | POA: Diagnosis not present

## 2021-03-30 DIAGNOSIS — E119 Type 2 diabetes mellitus without complications: Secondary | ICD-10-CM | POA: Diagnosis present

## 2021-03-30 DIAGNOSIS — M170 Bilateral primary osteoarthritis of knee: Secondary | ICD-10-CM | POA: Diagnosis not present

## 2021-03-30 MED ORDER — HYDROCODONE-ACETAMINOPHEN 10-325 MG PO TABS: 1.0000 | ORAL_TABLET | Freq: Two times a day (BID) | ORAL | 0 refills | Status: DC | PRN

## 2021-03-30 MED ORDER — TIZANIDINE HCL 4 MG PO CAPS: 4.0000 mg | ORAL_CAPSULE | Freq: Three times a day (TID) | ORAL | 3 refills | Status: DC | PRN

## 2021-03-30 NOTE — Progress Notes (Signed)
Subjective:    Patient ID: Jill Shaw, female    DOB: [birth year], 45 y.o.   MRN: [id number]  HPI: Jill Shaw is a 45 y.o. female who returns for follow up appointment for chronic pain and medication refill. She states her  pain is located in her bilateral arms describes her pain as achy pain, lower back pain radiates into her bilateral lower extremities and bilateral feet pain with tingling and burning. She rates her pain 9. Her current exercise regime is walking and performing stretching exercises.  Jill Shaw was admitted to [facility] on 03/23/2021 and discharged on 03/24/2021, she was brought to hospital via EMS with complaints of N/V, abdominal pain and chest pain. She was diagnosed with DKA. Discharge Summary was reviewed.    Jill Shaw  Shaw.   Last Oral Swab was Performed on 01/31/2021, it was consistent.   Pain Inventory Average Pain 10 Pain Right Now 9 My pain is sharp, burning, dull, stabbing, tingling, and aching  In the last 24 hours, has pain interfered with the following? General activity 1 Relation with others 1 Enjoyment of life 4 What TIME of day is your pain at its worst? morning , daytime, evening, and night Sleep (in general) Poor  Pain is worse with: walking, bending, sitting, inactivity, standing, and some activites Pain improves with: heat/ice and medication Relief from Meds: 10  Family History  Problem Relation Age of Onset   Diabetes Mother    Hypertension Mother    Social History   Socioeconomic History   Marital status: Legally Separated    Spouse name: Not on file   Number of children: 3   Years of education: Not on file   Highest education level: Not on file  Occupational History   Occupation: unemployed  Tobacco Use   Smoking status: Former    Packs/day: 0.25    Years: 26.00    Pack years: 6.50    Types: Cigarettes    Quit date: 09/13/2020    Years since quitting: 0.5   Smokeless tobacco: Never    Tobacco comments:    4-5 cigarettes/day  Vaping Use   Vaping Use: Never used  Substance and Sexual Activity   Alcohol use: No   Drug use: No   Sexual activity: Not Currently    Birth control/protection: None  Other Topics Concern   Not on file  Social History Narrative   Right Handed   Lives in a one story apartment, but lives on the second floor   Drinks caffeine once in awhile   Social Determinants of Health   Financial Resource Strain: Not on file  Food Insecurity: No Food Insecurity   Worried About Programme researcher, broadcasting/film/video in the Last Year: Never true   Ran Out of Food in the Last Year: Never true  Transportation Needs: No Transportation Needs   Lack of Transportation (Medical): No   Lack of Transportation (Non-Medical): No  Physical Activity: Not on file  Stress: Not on file  Social Connections: Not on file   Past Surgical History:  Procedure Laterality Date   CESAREAN SECTION     x 1. for twins   DILATION AND CURETTAGE OF UTERUS N/A 08/20/2019   Procedure: DILATATION AND CURETTAGE;  Surgeon: Allie Bossier, MD;  Location: [facility] OR;  Service: Gynecology;  Laterality: N/A;   ENDOMETRIAL ABLATION N/A 08/20/2019   Procedure: Minerva Ablation;  Surgeon: Allie Bossier, MD;  Location: [facility] OR;  Service: Gynecology;  Laterality: N/A;   EYE SURGERY Bilateral    laser right and cataract removed left eye   RADIOLOGY WITH ANESTHESIA N/A 09/16/2019   Procedure: MRI WITH ANESTHESIA   L SPINE WITHOUT CONTRAST, T SPINE WITHOUT CONTRAST , CERVICAL WITHOUT CONTRAST;  Surgeon: Radiologist, Medication, MD;  Location: [facility] OR;  Service: Radiology;  Laterality: N/A;   TUBAL LIGATION     interval BTL   UPPER GI ENDOSCOPY  07/2017   Past Surgical History:  Procedure Laterality Date   CESAREAN SECTION     x 1. for twins   DILATION AND CURETTAGE OF UTERUS N/A 08/20/2019   Procedure: DILATATION AND CURETTAGE;  Surgeon: Allie Bossier, MD;  Location: [facility] OR;  Service: Gynecology;  Laterality: N/A;    ENDOMETRIAL ABLATION N/A 08/20/2019   Procedure: Minerva Ablation;  Surgeon: Allie Bossier, MD;  Location: [facility] OR;  Service: Gynecology;  Laterality: N/A;   EYE SURGERY Bilateral    laser right and cataract removed left eye   RADIOLOGY WITH ANESTHESIA N/A 09/16/2019   Procedure: MRI WITH ANESTHESIA   L SPINE WITHOUT CONTRAST, T SPINE WITHOUT CONTRAST , CERVICAL WITHOUT CONTRAST;  Surgeon: Radiologist, Medication, MD;  Location: [facility] OR;  Service: Radiology;  Laterality: N/A;   TUBAL LIGATION     interval BTL   UPPER GI ENDOSCOPY  07/2017   Past Medical History:  Diagnosis Date   Anemia    Arthritis    knees, hands   Asthma    Chronic diastolic (congestive) heart failure (HCC)    COPD (chronic obstructive pulmonary disease) (HCC)    Diabetes mellitus without complication (HCC)    type 2   Dysfunctional uterine bleeding    GERD (gastroesophageal reflux disease)    Hypertension    Neuromuscular disorder (HCC)    neuropathy feet   Seizures (HCC) 09/12/2017   pt states r/t stress and blood sugar - no meds last one 4 months ago, not seen neurologist   Sickle cell trait (HCC)    Smoker    Vitamin D deficiency 10/2019   Wears glasses    BP 126/86 (BP Location: Right Arm, Patient Position: Sitting, Cuff Size: Large)   Pulse (!) 128   Temp 98.9 F (37.2 C)   Ht 5\' 4"  (1.626 m)   Wt 251 lb 9.6 oz (114.1 kg)   SpO2 95%   BMI 43.19 kg/m   Opioid Risk Score:   Fall Risk Score:  `1  Depression screen PHQ 2/9  Depression screen [facility] 2/9 03/30/2021 03/22/2021 01/31/2021 01/20/2021 01/07/2021 10/27/2020 10/13/2020  Decreased Interest 0 1 1 0 0 0 1  Down, Depressed, Hopeless 0 1 1 0 0 0 0  PHQ - 2 Score 0 2 2 0 0 0 1  Altered sleeping - - - 3 - 3 3  Tired, decreased energy - - - 3 - 2 3  Change in appetite - - - 0 - 0 0  Feeling bad or failure about yourself  - - - 0 - 0 0  Trouble concentrating - - - 1 - 0 2  Moving slowly or fidgety/restless - - - 0 - 0 0  Suicidal thoughts - - - 0 - 0 0   PHQ-9 Score - - - 7 - 5 9  Difficult doing work/chores - - - - - - Very difficult  Some recent data might be hidden      Review of Systems  Musculoskeletal:  Shoulder pain Leg pain Knee pain Arm pain  Neurological:  Positive for weakness and numbness.       Tingling  All other systems reviewed and are negative.     Objective:   Physical Exam Vitals and nursing note reviewed.  Constitutional:      Appearance: Normal appearance.  Cardiovascular:     Rate and Rhythm: Regular rhythm. Tachycardia present.     Pulses: Normal pulses.     Heart sounds: Normal heart sounds.  Pulmonary:     Effort: Pulmonary effort is normal.     Breath sounds: Normal breath sounds.  Musculoskeletal:     Cervical back: Normal range of motion and neck supple.     Comments: Normal Muscle Bulk and Muscle Testing Reveals:  Upper Extremities: Full ROM and Muscle Strength 5/5 Lumbar Hypersensitivity Lower Extremities: Full ROM and Muscle Strength 5/5 Arises from Table with ease Narrow Based  Gait     Neurological:     Mental Status: She is alert.          Assessment & Plan:  1. Lumbar Radiculitis: Continue current medication regimen. Continue HEP as Tolerated.03/30/2021 2. Fibromyalgia/Polyneuropathy: Continue HEP as Tolerated. Continue current Medication regimen. Continue to Monitor. 3. Bilateral Knee Pain: No complaints today. Continue to Monitor.03/30/2021 4. Chronic Pain Syndrome: Refilled: Hydrocodone 10/325mg  one tablet twice a day as needed for pain #60. We will continue the opioid monitoring program, this consists of regular clinic visits, examinations, urine drug screen, pill counts as well as use of [place] Controlled Substance Reporting system. A 12 month History has been reviewed on the [place] Controlled Substance Reporting System on 03/30/2021.  5. Tachycardia: Apical pulse check. Jill Shaw will F/U with her PCP Continue to monitor.   F/U in 1 month

## 2021-04-01 ENCOUNTER — Ambulatory Visit: Payer: 59 | Admitting: Internal Medicine

## 2021-04-04 ENCOUNTER — Other Ambulatory Visit: Payer: Self-pay | Admitting: Physical Medicine and Rehabilitation

## 2021-04-04 MED ORDER — TIZANIDINE HCL 4 MG PO TABS: 4.0000 mg | ORAL_TABLET | Freq: Four times a day (QID) | ORAL | 3 refills | Status: DC | PRN

## 2021-04-05 ENCOUNTER — Telehealth: Payer: Self-pay | Admitting: *Deleted

## 2021-04-05 NOTE — Telephone Encounter (Signed)
Jill Shaw called requesting another handicap placard and not one that expires in 5 years but one that is "forever".

## 2021-04-06 ENCOUNTER — Encounter: Payer: Self-pay | Admitting: *Deleted

## 2021-04-06 NOTE — Telephone Encounter (Signed)
Per Dr Carlis Abbott:  I think that would be appropriate for her, please let her know that I would be happy to give this to her when I see her next in clinic- I know she is on a waitlist for earlier appointment- I am ok with opening up next Tuesday at 2pm for her for [employer] application and I can get this for her as well. Misty Stanley have you already received auth for her?    I have sent a mychart message letting her know.

## 2021-04-07 ENCOUNTER — Encounter: Payer: Self-pay | Admitting: Registered Nurse

## 2021-04-07 ENCOUNTER — Other Ambulatory Visit (HOSPITAL_COMMUNITY): Payer: Self-pay | Admitting: Physical Medicine and Rehabilitation

## 2021-04-07 DIAGNOSIS — M170 Bilateral primary osteoarthritis of knee: Secondary | ICD-10-CM

## 2021-04-11 ENCOUNTER — Other Ambulatory Visit: Payer: Medicaid Other

## 2021-04-12 ENCOUNTER — Ambulatory Visit: Payer: Medicaid Other

## 2021-04-13 ENCOUNTER — Other Ambulatory Visit: Payer: Self-pay | Admitting: Nurse Practitioner

## 2021-04-13 MED ORDER — SUCRALFATE 1 GM/10ML PO SUSP: 1.0000 g | Freq: Four times a day (QID) | ORAL | 1 refills | Status: DC

## 2021-04-13 MED ORDER — DEXLANSOPRAZOLE 30 MG PO CPDR: 30.0000 mg | DELAYED_RELEASE_CAPSULE | Freq: Every day | ORAL | 11 refills | Status: DC

## 2021-04-13 NOTE — Progress Notes (Signed)
   [facility] [address] [place], [place]  [address] Phone:  [phone]   Fax:  [phone] 

## 2021-04-15 ENCOUNTER — Telehealth: Payer: Self-pay | Admitting: Physical Medicine and Rehabilitation

## 2021-04-15 ENCOUNTER — Ambulatory Visit (INDEPENDENT_AMBULATORY_CARE_PROVIDER_SITE_OTHER): Payer: Medicaid Other

## 2021-04-15 ENCOUNTER — Other Ambulatory Visit: Payer: Self-pay

## 2021-04-15 VITALS — BP 147/87 | HR 99 | Wt 261.5 lb

## 2021-04-15 DIAGNOSIS — Z3042 Encounter for surveillance of injectable contraceptive: Secondary | ICD-10-CM | POA: Diagnosis not present

## 2021-04-15 MED ORDER — MEDROXYPROGESTERONE ACETATE 150 MG/ML IM SUSP
150.0000 mg | Freq: Once | INTRAMUSCULAR | Status: AC
  Administered 2021-04-15: 150 mg via INTRAMUSCULAR

## 2021-04-15 NOTE — Progress Notes (Signed)
Jill Shaw here for Depo-Provera Injection. Injection administered without complication. Patient will return in 3 months for next injection between 07/01/21 and 07/15/21. Next annual visit due 01/20/21.   Marjo Bicker, RN 04/15/2021  9:19 AM

## 2021-04-15 NOTE — Progress Notes (Signed)
Patient was assessed and managed by nursing staff during this encounter. I have reviewed the chart and agree with the documentation and plan.   Jaynie Collins, MD 04/15/2021 10:48 AM

## 2021-04-15 NOTE — Telephone Encounter (Signed)
Ticanidine would like to have refilled early she is going out of town. Need authed

## 2021-04-19 ENCOUNTER — Telehealth: Payer: Self-pay | Admitting: Nurse Practitioner

## 2021-04-19 ENCOUNTER — Ambulatory Visit: Payer: Medicaid Other | Admitting: Student

## 2021-04-19 NOTE — Progress Notes (Deleted)
Primary Physician/Referring:  Vevelyn Francois, NP  Patient ID: Jill Shaw, female    DOB: 04/15/1976, 45 y.o.   MRN: [id number]  No chief complaint on file.  HPI:    Jill Shaw  is a 45 y.o. African-American female with hypertension, type 2 diabetes mellitus, HFpEF, COPD, tobacco dependence, morbid obesity, microcytic anemia, uterine dysfunction, recurrent syncope.   Patient presents for urgent visit with complaints of chest pain worse with arm movement, bending over, and pushing up off the toilet to stand up.  This pain lasts several seconds and she describes it as sharp.  Patient has also been having shortness of breath both with exertion and at rest which she states is chronic and unchanged compared to baseline.  She also notes palpitations worse with exertion and relieved with rest occurring nearly daily over the last 3-4 weeks.  Reports home blood pressure readings averaging 130/80 mmHg.  She has discontinued hydralazine due to dizziness.  Patient has still not undergone outpatient stress testing as previously recommended.  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  Past Medical History:  Diagnosis Date   Anemia    Arthritis    knees, hands   Asthma    Chronic diastolic (congestive) heart failure (HCC)    COPD (chronic obstructive pulmonary disease) (HCC)    Diabetes mellitus without complication (HCC)    type 2   Dysfunctional uterine bleeding    GERD (gastroesophageal reflux disease)    Hypertension    Neuromuscular disorder ([place])    neuropathy feet   Seizures ([place]) 09/12/2017   pt states r/t stress and blood sugar - no meds last one 4 months ago, not seen neurologist   Sickle cell trait ([place])    Smoker    Vitamin D deficiency 10/2019   Wears glasses    Past Surgical History:  Procedure Laterality Date   CESAREAN SECTION     x 1. for twins   DILATION AND CURETTAGE OF UTERUS N/A 08/20/2019   Procedure:  DILATATION AND CURETTAGE;  Surgeon: Emily Filbert, MD;  Location: [facility];  Service: Gynecology;  Laterality: N/A;   ENDOMETRIAL ABLATION N/A 08/20/2019   Procedure: [place] Ablation;  Surgeon: Emily Filbert, MD;  Location: [facility];  Service: Gynecology;  Laterality: N/A;   EYE SURGERY Bilateral    laser right and cataract removed left eye   RADIOLOGY WITH ANESTHESIA N/A 09/16/2019   Procedure: MRI WITH ANESTHESIA   L SPINE WITHOUT CONTRAST, T SPINE WITHOUT CONTRAST , CERVICAL WITHOUT CONTRAST;  Surgeon: Radiologist, Medication, MD;  Location: [facility];  Service: Radiology;  Laterality: N/A;   TUBAL LIGATION     interval BTL   UPPER GI ENDOSCOPY  07/2017   Family History  Problem Relation Age of Onset   Diabetes Mother    Hypertension Mother     Social History   Tobacco Use   Smoking status: Some Days    Packs/day: 0.25    Years: 26.00    Pack years: 6.50    Types: Cigarettes    Last attempt to quit: 09/13/2020    Years since quitting: 0.5   Smokeless tobacco: Never   Tobacco comments:    4-5 cigarettes/day  Substance Use Topics   Alcohol use: No   Marital Status: Legally Separated   ROS  Review of Systems  Constitutional: Negative for malaise/fatigue and weight gain.  Cardiovascular:  Positive for chest pain and palpitations.  Negative for claudication, leg swelling, near-syncope, orthopnea, paroxysmal nocturnal dyspnea and syncope.  Respiratory:  Positive for shortness of breath.   Hematologic/Lymphatic: Does not bruise/bleed easily.  Gastrointestinal:  Negative for melena.  Neurological:  Negative for dizziness and weakness.   Objective  There were no vitals taken for this visit.  Vitals with BMI 04/15/2021 03/30/2021 03/30/2021  Height - - 5' 4"  Weight 261 lbs 8 oz - 251 lbs 10 oz  BMI 03.47 - 42.59  Systolic 563 - 875  Diastolic 87 - 86  Pulse 99 120 128      Physical Exam Vitals reviewed.  Constitutional:      Appearance: She is obese.  HENT:     Head: Normocephalic  and atraumatic.  Cardiovascular:     Rate and Rhythm: Regular rhythm. Tachycardia present.     Pulses: Intact distal pulses.     Heart sounds: S1 normal and S2 normal. No murmur heard.   No gallop.  Pulmonary:     Effort: Pulmonary effort is normal. No respiratory distress.     Breath sounds: No wheezing, rhonchi or rales.  Abdominal:     General: Bowel sounds are normal.     Palpations: Abdomen is soft.  Musculoskeletal:     Right lower leg: No edema.     Left lower leg: No edema.  Neurological:     Mental Status: She is alert.    Laboratory examination:   Recent Labs    07/26/20 1202 09/20/20 1220 10/15/20 1742 12/02/20 1405 12/23/20 1747 03/23/21 1406 03/23/21 1839 03/24/21 0050  NA 140 137   < > 136   < > 133* 133* 132*  K 4.0 4.7   < > 3.8   < > 5.4* 4.4 4.4  CL 102 103   < > 90*   < > 99 99 96*  CO2 22 18*   < >  --    < > 21* 23 23  GLUCOSE 160* 356*   < > 536*   < > 371* 299* 288*  BUN 10 16   < > 10   < > _0 CREATININE 1.13* 1.25*   < > 1.48*   < > 1.49* 1.48* 1.52*  CALCIUM 8.7 9.2   < > 9.2   < > 9.4 9.3 9.4  GFRNONAA 60 53*   < > 43*   < > 44* 45* 43*  GFRAA 69 61  --  49*  --   --   --   --    < > = values in this interval not displayed.    CrCl cannot be calculated (Patient's most recent lab result is older than the maximum 21 days allowed.).  CMP Latest Ref Rng & Units 03/24/2021 03/23/2021 03/23/2021  Glucose 70 - 99 mg/dL 288(H) 299(H) 371(H)  BUN 6 - 20 mg/dL _1 Creatinine 0.44 - 1.00 mg/dL 1.52(H) 1.48(H) 1.49(H)  Sodium 135 - 145 mmol/L 132(L) 133(L) 133(L)  Potassium 3.5 - 5.1 mmol/L 4.4 4.4 5.4(H)  Chloride 98 - 111 mmol/L 96(L) 99 99  CO2 22 - 32 mmol/L 23 23 21(L)  Calcium 8.9 - 10.3 mg/dL 9.4 9.3 9.4  Total Protein 6.5 - 8.1 g/dL 6.8 - -  Total Bilirubin 0.3 - 1.2 mg/dL 0.7 - -  Alkaline Phos 38 - 126 U/L 75 - -  AST 15 - 41 U/L 28 - -  ALT 0 - 44 U/L 19 - -   CBC  Latest Ref Rng & Units 03/24/2021 03/23/2021 03/15/2021  WBC 4.0  - 10.5 K/uL 12.8(H) 13.6(H) 9.5  Hemoglobin 12.0 - 15.0 g/dL 9.9(L) 11.3(L) 10.1(L)  Hematocrit 36.0 - 46.0 % 32.1(L) 36.8 33.7(L)  Platelets 150 - 400 K/uL 313 352 243    Lipid Panel Recent Labs    06/02/20 1352 11/20/20 1034  CHOL 135  --   TRIG 166* 280*  LDLCALC 77  --   HDL 29*  --   CHOLHDL 4.7*  --      HEMOGLOBIN A1C Lab Results  Component Value Date   HGBA1C 10.7 (A) 02/16/2021   MPG 231.69 12/23/2020   TSH Recent Labs    12/02/20 1405 01/10/21 1558  TSH 1.250 0.969     External labs:  12/26/2020: Glucose 243, BUN/Cr 10/1.17. EGFR 49. Na/K 134/4.6 Trop HS 5,4 H/H 9.8/32.3. MCV 74. Platelets 313 HbA1C 9.7%   07/26/2020: Glucose 160, BUN/Cr 10/1.13. EGFR 60. Na/K 140/4.0. Rest of the CMP normal H/H 9.5/34.7. MCV 70. Platelets 311 HbA1C 9.2% Chol 135, TG 166, HDL 29, LDL 77  Medications and allergies   Allergies  Allergen Reactions   Ketoprofen Nausea And Vomiting   Aspirin Nausea Only   Gabapentin Nausea And Vomiting and Other (See Comments)    upset stomach   Ibuprofen Nausea And Vomiting   Liraglutide Nausea And Vomiting   Naproxen Nausea And Vomiting   Omeprazole-Sodium Bicarbonate Nausea And Vomiting   Sulfa Antibiotics Nausea And Vomiting   Tramadol Nausea And Vomiting and Other (See Comments)    stomach upset     Outpatient Medications Prior to Visit  Medication Sig Dispense Refill   ACCU-CHEK GUIDE test strip USE AS DIRECTED UP TO FOUR TIMES DAILY (Patient taking differently: 1 each by Other route in the morning, at noon, in the evening, and at bedtime.) 100 strip 4   Accu-Chek Softclix Lancets lancets USE AS DIRECTED UP TO FOUR TIMES DAILY 100 each 3   albuterol (PROVENTIL) (2.5 MG/3ML) 0.083% nebulizer solution Take 3 mLs (2.5 mg total) by nebulization every 6 (six) hours as needed for wheezing or shortness of breath. 150 mL 6   albuterol (VENTOLIN HFA) 108 (90 Base) MCG/ACT inhaler Inhale 2 puffs into the lungs every 6 (six) hours  as needed for wheezing or shortness of breath. 8 g 6   blood glucose meter kit and supplies KIT 1 each by Other route See admin instructions. Dispense based on patient and insurance preference. Use up to four times daily as directed. (FOR ICD-9 250.00, 250.01). 1 each 2   budesonide-formoterol (SYMBICORT) 160-4.5 MCG/ACT inhaler INHALE 2 PUFFS INTO THE LUNGS 2 (TWO) TIMES DAILY. (Patient taking differently: Inhale 2 puffs into the lungs 2 (two) times daily.) 10.2 g 6   cetirizine (ZYRTEC) 10 MG tablet Take 10 mg by mouth daily.     Dexlansoprazole (DEXILANT) 30 MG capsule Take 1 capsule (30 mg total) by mouth daily. 30 capsule 11   diltiazem (CARDIZEM CD) 180 MG 24 hr capsule Take 180 mg by mouth daily.     empagliflozin (JARDIANCE) 10 MG TABS tablet Take 1 tablet (10 mg total) by mouth daily before breakfast. 30 tablet 6   ferrous sulfate 325 (65 FE) MG tablet Take 1 tablet (325 mg total) by mouth 3 (three) times daily with meals. 270 tablet 3   fluticasone (FLONASE) 50 MCG/ACT nasal spray Place 2 sprays into both nostrils daily. 16 g 6   furosemide (LASIX) 40 MG tablet TAKE 1 TABLET(40 MG)  BY MOUTH TWICE DAILY (Patient taking differently: Take 40 mg by mouth 2 (two) times daily.) 180 tablet 1   Glucosamine Sulfate 1000 MG CAPS Take 1 capsule (1,000 mg total) by mouth 2 (two) times daily. (Patient taking differently: Take 1,000 mg by mouth 2 (two) times daily.) 180 capsule 3   Heating Pads (HEATING PAD MOIST/DRY KING SZ) PADS 1 each by Does not apply route 4 (four) times daily as needed. 120 each 1   hydrochlorothiazide (MICROZIDE) 12.5 MG capsule Take 1 capsule (12.5 mg total) by mouth daily. 30 capsule 3   HYDROcodone-acetaminophen (NORCO) 10-325 MG tablet Take 1 tablet by mouth 2 (two) times daily as needed. 60 tablet 0   hydrocortisone 2.5 % cream Apply topically 2 (two) times daily. (Patient taking differently: Apply 1 application topically 2 (two) times daily.) 30 g 11   hydroquinone 4 % cream  APPLY TOPICALLY TWICE DAILY (Patient taking differently: Apply 1 application topically 2 (two) times daily as needed (pain).) 28.35 g 0   insulin glargine (LANTUS SOLOSTAR) 100 UNIT/ML Solostar Pen Inject 110 Units into the skin daily. 105 mL 6   insulin lispro (HUMALOG KWIKPEN) 100 UNIT/ML KwikPen Inject 40 Units into the skin 3 (three) times daily. 120 mL 3   Insulin Pen Needle 31G X 8 MM MISC 1 Device by Does not apply route in the morning, at noon, in the evening, and at bedtime. 400 each 3   losartan (COZAAR) 50 MG tablet Take 1 tablet (50 mg total) by mouth daily. TAKE 1 TABLET(50MG TOTAL) BY MOUTH EVERY DAY (Patient taking differently: Take 50 mg by mouth daily.) 90 tablet 3   melatonin 3 MG TABS tablet Take 1 tablet (3 mg total) by mouth at bedtime as needed. (Patient not taking: Reported on 04/15/2021)  0   metoprolol succinate (TOPROL-XL) 100 MG 24 hr tablet Take 100 mg by mouth daily. Take with or immediately following a meal.     nicotine (NICODERM CQ - DOSED IN MG/24 HOURS) 14 mg/24hr patch Place 1 patch (14 mg total) onto the skin daily for 21 days. 21 patch 0   nicotine (NICODERM CQ) 7 mg/24hr patch Place 1 patch (7 mg total) onto the skin daily. Start only when the 14 mg patches are finished. (Patient not taking: Reported on 04/15/2021) 14 patch 0   norethindrone (AYGESTIN) 5 MG tablet Take 2 tablets (10 mg total) by mouth in the morning, at noon, in the evening, and at bedtime. With bleeding (Patient not taking: Reported on 04/15/2021) 120 tablet 0   ondansetron (ZOFRAN ODT) 4 MG disintegrating tablet Take 1 tablet (4 mg total) by mouth every 8 (eight) hours as needed for nausea or vomiting. 20 tablet 0   potassium chloride SA (KLOR-CON) 20 MEQ tablet Take 20 mEq by mouth daily.     rosuvastatin (CRESTOR) 5 MG tablet Take 1 tablet (5 mg total) by mouth daily. 90 tablet 3   saxagliptin HCl (ONGLYZA) 5 MG TABS tablet Take 1 tablet (5 mg total) by mouth daily. 90 tablet 3   senna-docusate  (SENOKOT-S) 8.6-50 MG tablet Take 1 tablet by mouth at bedtime as needed for mild constipation. (Patient not taking: Reported on 04/15/2021) 30 tablet 0   spironolactone (ALDACTONE) 50 MG tablet TAKE 1 TABLET(50 MG) BY MOUTH DAILY (Patient taking differently: Take 50 mg by mouth daily.) 30 tablet 3   sucralfate (CARAFATE) 1 GM/10ML suspension Take 10 mLs (1 g total) by mouth 4 (four) times daily. 420 mL 1  Tiotropium Bromide Monohydrate (SPIRIVA RESPIMAT) 2.5 MCG/ACT AERS Inhale 2 puffs into the lungs daily. 4 g 6   tiZANidine (ZANAFLEX) 4 MG tablet Take 1 tablet (4 mg total) by mouth every 6 (six) hours as needed for muscle spasms. 120 tablet 3   topiramate (TOPAMAX) 25 MG tablet Take 1 tablet (25 mg total) by mouth 2 (two) times daily. 30 tablet 1   Vitamin D, Ergocalciferol, (DRISDOL) 1.25 MG (50000 UNIT) CAPS capsule Take 1 capsule (50,000 Units total) by mouth every 7 (seven) days. (Patient taking differently: Take 50,000 Units by mouth every Friday.) 5 capsule 6   vitamin E (VITAMIN E) 180 MG (400 UNITS) capsule Take 1 capsule (400 Units total) by mouth daily. 30 capsule 1   No facility-administered medications prior to visit.     Radiology:   No results found.  Cardiac Studies:    Vascular US 07/16/2020: No DVT   Echocardiogram 04/30/2020: 1. Left ventricular ejection fraction, by estimation, is 55 to 60%. The  left ventricle has normal function. The left ventricle has no regional  wall motion abnormalities. There is moderate concentric left ventricular  hypertrophy. Left ventricular  diastolic parameters are consistent with Grade I diastolic dysfunction  (impaired relaxation). Elevated left ventricular end-diastolic pressure.   2. Right ventricular systolic function is normal. The right ventricular  size is normal.   3. The mitral valve is normal in structure. Trivial mitral valve  regurgitation. No evidence of mitral stenosis.   4. The aortic valve is normal in structure.  Aortic valve regurgitation is  not visualized. No aortic stenosis is present.   5. The inferior vena cava is dilated in size with <50% respiratory  variability, suggesting right atrial pressure of 15 mmHg.    EKG:   EKG 02/11/2021: Sinus tachycardia rate of 106 bpm.  Left axis, left anterior fascicular block.  Poor R wave progression, cannot exclude anteroseptal infarct old.  EKG 10/14/2020: Sinus tachycardia 111 bpm  Cannot exclude old anteroseptal infarct   EKG 09/01/2020: Sinus rhythm 85 bpm  Poor R wave progression Otherwise normal EKG  Assessment   No diagnosis found.    There are no discontinued medications.   No orders of the defined types were placed in this encounter.   Recommendations:   JAIMY KLIETHERMES is a 45 y.o. African-American female with hypertension, type 2 diabetes mellitus, HFpEF, COPD, tobacco dependence, morbid obesity, microcytic anemia, uterine dysfunction, recurrent syncope.   Patient presents for urgent visit with complaints of chest pain and palpitations.   Exertional chest pain, dyspnea: Patient has history of exertional chest pain and dyspnea, however symptoms today seem more consistent with musculoskeletal etiology as chest pain is worse with arm movement.  However patient does have multiple cardiovascular risk factors, therefore will obtain Lexiscan nuclear stress test and echocardiogram.  Underlying etiologies contributing to chest pain and dyspnea include hypertension, anemia, and obesity. EKG today unchanged compared to previous.   Chronic heart failure with preserved ejection fraction (HCC) Currently euvolumic on exam.  However patient complains of continued dyspnea and chest pain.  Will obtain BNP. Likely related to hypertension, obesity, controlled type 2 diabetes mellitus.   Continue current medical management.   Hypertension Uncontrolled. Patient is also tachycardic, therefore consider diltiazem.  However Will not initiate diltiazem  at this time as echocardiogram is pending.  We will therefore start hydrochlorothiazide 12.5 mg daily with repeat BMP in 1 week.  Palpitations: Patient is having near daily palpitations over the last few weeks.  We will obtain ambulatory cardiac telemetry to evaluate for underlying cardiac arrhythmias.   Controlled type 2 diabetes mellitus: Needs aggressive management. Defer to PCP.  Follow-up in 6 weeks, sooner if needed, for results of cardiac testing.    Alethia Berthold, PA-C 04/19/2021, 12:09 PM Office: [phone]

## 2021-04-19 NOTE — Telephone Encounter (Signed)
Via Covermy meds(Key: [id number]) - [id number] Dexilant 30MG  dr capsules     Status: PA Response - Approved

## 2021-04-25 ENCOUNTER — Emergency Department (HOSPITAL_COMMUNITY)
Admission: EM | Admit: 2021-04-25 | Discharge: 2021-04-26 | Disposition: A | Discharge status: Home / Self Care | Payer: Medicaid Other | Attending: Emergency Medicine | Admitting: Emergency Medicine

## 2021-04-25 ENCOUNTER — Encounter (HOSPITAL_COMMUNITY): Payer: Self-pay

## 2021-04-25 ENCOUNTER — Other Ambulatory Visit: Payer: Self-pay

## 2021-04-25 DIAGNOSIS — R000 Tachycardia, unspecified: Secondary | ICD-10-CM | POA: Diagnosis not present

## 2021-04-25 DIAGNOSIS — R6883 Chills (without fever): Secondary | ICD-10-CM | POA: Insufficient documentation

## 2021-04-25 DIAGNOSIS — R10817 Generalized abdominal tenderness: Secondary | ICD-10-CM | POA: Diagnosis not present

## 2021-04-25 DIAGNOSIS — J449 Chronic obstructive pulmonary disease, unspecified: Secondary | ICD-10-CM | POA: Insufficient documentation

## 2021-04-25 DIAGNOSIS — Z7951 Long term (current) use of inhaled steroids: Secondary | ICD-10-CM | POA: Insufficient documentation

## 2021-04-25 DIAGNOSIS — E119 Type 2 diabetes mellitus without complications: Secondary | ICD-10-CM | POA: Diagnosis not present

## 2021-04-25 DIAGNOSIS — J45909 Unspecified asthma, uncomplicated: Secondary | ICD-10-CM | POA: Diagnosis not present

## 2021-04-25 DIAGNOSIS — R52 Pain, unspecified: Secondary | ICD-10-CM | POA: Diagnosis not present

## 2021-04-25 DIAGNOSIS — E876 Hypokalemia: Secondary | ICD-10-CM | POA: Insufficient documentation

## 2021-04-25 DIAGNOSIS — Z79899 Other long term (current) drug therapy: Secondary | ICD-10-CM | POA: Insufficient documentation

## 2021-04-25 DIAGNOSIS — I110 Hypertensive heart disease with heart failure: Secondary | ICD-10-CM | POA: Diagnosis not present

## 2021-04-25 DIAGNOSIS — R112 Nausea with vomiting, unspecified: Secondary | ICD-10-CM | POA: Insufficient documentation

## 2021-04-25 DIAGNOSIS — Z20822 Contact with and (suspected) exposure to covid-19: Secondary | ICD-10-CM | POA: Diagnosis not present

## 2021-04-25 DIAGNOSIS — I5033 Acute on chronic diastolic (congestive) heart failure: Secondary | ICD-10-CM | POA: Insufficient documentation

## 2021-04-25 DIAGNOSIS — Z7984 Long term (current) use of oral hypoglycemic drugs: Secondary | ICD-10-CM | POA: Diagnosis not present

## 2021-04-25 DIAGNOSIS — F17210 Nicotine dependence, cigarettes, uncomplicated: Secondary | ICD-10-CM | POA: Insufficient documentation

## 2021-04-25 DIAGNOSIS — R739 Hyperglycemia, unspecified: Secondary | ICD-10-CM | POA: Diagnosis not present

## 2021-04-25 DIAGNOSIS — R109 Unspecified abdominal pain: Secondary | ICD-10-CM | POA: Diagnosis not present

## 2021-04-25 DIAGNOSIS — R1084 Generalized abdominal pain: Secondary | ICD-10-CM | POA: Diagnosis not present

## 2021-04-25 DIAGNOSIS — R1110 Vomiting, unspecified: Secondary | ICD-10-CM | POA: Diagnosis not present

## 2021-04-25 DIAGNOSIS — Z794 Long term (current) use of insulin: Secondary | ICD-10-CM | POA: Diagnosis not present

## 2021-04-25 DIAGNOSIS — I517 Cardiomegaly: Secondary | ICD-10-CM | POA: Diagnosis not present

## 2021-04-25 NOTE — ED Triage Notes (Signed)
Pt BIB EMS, pt has been having abdominal pain for the past 3 hrs. She had a similar situation and was started on Protonix. (Her appointment isn't until August). Pt complains of N/V.   Vitals  184/90 blood pressure 107 heart rate  18 Respiratory  400 CBG   20 Gauge to right [facility]

## 2021-04-26 ENCOUNTER — Emergency Department (HOSPITAL_COMMUNITY): Payer: Medicaid Other

## 2021-04-26 ENCOUNTER — Encounter: Payer: Medicaid Other | Admitting: Registered Nurse

## 2021-04-26 ENCOUNTER — Encounter (HOSPITAL_COMMUNITY): Payer: Self-pay

## 2021-04-26 DIAGNOSIS — R109 Unspecified abdominal pain: Secondary | ICD-10-CM | POA: Diagnosis not present

## 2021-04-26 DIAGNOSIS — R6883 Chills (without fever): Secondary | ICD-10-CM | POA: Diagnosis not present

## 2021-04-26 DIAGNOSIS — I517 Cardiomegaly: Secondary | ICD-10-CM | POA: Diagnosis not present

## 2021-04-26 DIAGNOSIS — R1110 Vomiting, unspecified: Secondary | ICD-10-CM | POA: Diagnosis not present

## 2021-04-26 LAB — COMPREHENSIVE METABOLIC PANEL
ALT: 10 U/L (ref 0–44)
AST: 16 U/L (ref 15–41)
Albumin: 3.7 g/dL (ref 3.5–5.0)
Alkaline Phosphatase: 91 U/L (ref 38–126)
Anion gap: 8 (ref 5–15)
BUN: 12 mg/dL (ref 6–20)
CO2: 24 mmol/L (ref 22–32)
Calcium: 9.2 mg/dL (ref 8.9–10.3)
Chloride: 108 mmol/L (ref 98–111)
Creatinine, Ser: 0.93 mg/dL (ref 0.44–1.00)
GFR, Estimated: >60 mL/min (ref >60)
Glucose, Bld: 261 mg/dL — ABNORMAL HIGH (ref 70–99)
Potassium: 3.1 mmol/L — ABNORMAL LOW (ref 3.5–5.1)
Sodium: 140 mmol/L (ref 135–145)
Total Bilirubin: 0.7 mg/dL (ref 0.3–1.2)
Total Protein: 6.9 g/dL (ref 6.5–8.1)

## 2021-04-26 LAB — BLOOD GAS, VENOUS
Acid-base deficit: 0.5 mmol/L (ref 0.0–2.0)
Bicarbonate: 24 mmol/L (ref 20.0–28.0)
O2 Saturation: 72.2 %
Patient temperature: 98.6
pCO2, Ven: 41.2 mmHg — ABNORMAL LOW (ref 44.0–60.0)
pH, Ven: 7.383 (ref 7.250–7.430)
pO2, Ven: 44.4 mmHg (ref 32.0–45.0)

## 2021-04-26 LAB — URINALYSIS, ROUTINE W REFLEX MICROSCOPIC
Bacteria, UA: NONE SEEN
Bilirubin Urine: NEGATIVE
Glucose, UA: >=500 mg/dL — AB
Hgb urine dipstick: NEGATIVE
Ketones, ur: NEGATIVE mg/dL
Leukocytes,Ua: NEGATIVE
Nitrite: NEGATIVE
Protein, ur: 30 mg/dL — AB
Specific Gravity, Urine: >1.046 — ABNORMAL HIGH (ref 1.005–1.030)
pH: 5 (ref 5.0–8.0)

## 2021-04-26 LAB — CBG MONITORING, ED
Glucose-Capillary: 262 mg/dL — ABNORMAL HIGH (ref 70–99)
Glucose-Capillary: 193 mg/dL — ABNORMAL HIGH (ref 70–99)

## 2021-04-26 LAB — CBC WITH DIFFERENTIAL/PLATELET
Abs Immature Granulocytes: 0.03 10*3/uL (ref 0.00–0.07)
Basophils Absolute: 0.1 10*3/uL (ref 0.0–0.1)
Basophils Relative: 1 %
Eosinophils Absolute: 0.3 10*3/uL (ref 0.0–0.5)
Eosinophils Relative: 3 %
HCT: 32.1 % — ABNORMAL LOW (ref 36.0–46.0)
Hemoglobin: 9.4 g/dL — ABNORMAL LOW (ref 12.0–15.0)
Immature Granulocytes: 0 %
Lymphocytes Relative: 25 %
Lymphs Abs: 2.4 10*3/uL (ref 0.7–4.0)
MCH: 20.4 pg — ABNORMAL LOW (ref 26.0–34.0)
MCHC: 29.3 g/dL — ABNORMAL LOW (ref 30.0–36.0)
MCV: 69.6 fL — ABNORMAL LOW (ref 80.0–100.0)
Monocytes Absolute: 0.5 10*3/uL (ref 0.1–1.0)
Monocytes Relative: 6 %
Neutro Abs: 6.3 10*3/uL (ref 1.7–7.7)
Neutrophils Relative %: 65 %
Platelets: 270 10*3/uL (ref 150–400)
RBC: 4.61 MIL/uL (ref 3.87–5.11)
RDW: 24 % — ABNORMAL HIGH (ref 11.5–15.5)
WBC: 9.6 10*3/uL (ref 4.0–10.5)
nRBC: 0 % (ref 0.0–0.2)

## 2021-04-26 LAB — RESP PANEL BY RT-PCR (FLU A&B, COVID) ARPGX2
Influenza A by PCR: NEGATIVE
Influenza B by PCR: NEGATIVE
SARS Coronavirus 2 by RT PCR: NEGATIVE

## 2021-04-26 LAB — LACTIC ACID, PLASMA: Lactic Acid, Venous: 1.3 mmol/L (ref 0.5–1.9)

## 2021-04-26 LAB — BRAIN NATRIURETIC PEPTIDE: B Natriuretic Peptide: 23.3 pg/mL (ref 0.0–100.0)

## 2021-04-26 LAB — LIPASE, BLOOD: Lipase: 24 U/L (ref 11–51)

## 2021-04-26 MED ORDER — MORPHINE SULFATE (PF) 4 MG/ML IV SOLN
4.0000 mg | Freq: Once | INTRAVENOUS | Status: AC
  Administered 2021-04-26: 4 mg via INTRAVENOUS
  Filled 2021-04-26: qty 1

## 2021-04-26 MED ORDER — ONDANSETRON HCL 4 MG/2ML IJ SOLN
4.0000 mg | Freq: Once | INTRAMUSCULAR | Status: AC
  Administered 2021-04-26: 4 mg via INTRAVENOUS
  Filled 2021-04-26: qty 2

## 2021-04-26 MED ORDER — POTASSIUM CHLORIDE CRYS ER 20 MEQ PO TBCR
40.0000 meq | EXTENDED_RELEASE_TABLET | Freq: Once | ORAL | Status: AC
  Administered 2021-04-26: 40 meq via ORAL
  Filled 2021-04-26: qty 2

## 2021-04-26 MED ORDER — SODIUM CHLORIDE (PF) 0.9 % IJ SOLN
INTRAMUSCULAR | Status: AC
  Filled 2021-04-26: qty 50

## 2021-04-26 MED ORDER — IOHEXOL 350 MG/ML SOLN
100.0000 mL | Freq: Once | INTRAVENOUS | Status: AC | PRN
  Administered 2021-04-26: 80 mL via INTRAVENOUS

## 2021-04-26 NOTE — ED Provider Notes (Signed)
[facility] DEPT Provider Note   CSN: [id number] Arrival date & time: 04/25/21  2225     History Chief Complaint  Patient presents with   Abdominal Pain   Nausea   Emesis    Jill Shaw is a 45 y.o. female.  The history is provided by the patient and medical records. No language interpreter was used.  Abdominal Pain Pain location:  Generalized Pain quality: aching, cramping and pressure   Pain radiates to:  Does not radiate Pain severity:  Severe Onset quality:  Gradual Duration:  1 day Timing:  Constant Progression:  Waxing and waning Chronicity:  Recurrent Context: retching   Relieved by:  Nothing Worsened by:  Palpation Ineffective treatments:  None tried Associated symptoms: chills, cough, fatigue, nausea and vomiting   Associated symptoms: no chest pain, no constipation, no diarrhea, no dysuria, no fever, no shortness of breath, no vaginal bleeding and no vaginal discharge   Emesis Associated symptoms: abdominal pain, chills and cough   Associated symptoms: no diarrhea, no fever and no headaches       Past Medical History:  Diagnosis Date   Anemia    Arthritis    knees, hands   Asthma    Chronic diastolic (congestive) heart failure (HCC)    COPD (chronic obstructive pulmonary disease) (HCC)    Diabetes mellitus without complication (HCC)    type 2   Dysfunctional uterine bleeding    GERD (gastroesophageal reflux disease)    Hypertension    Neuromuscular disorder (HCC)    neuropathy feet   Seizures ([place]) 09/12/2017   pt states r/t stress and blood sugar - no meds last one 4 months ago, not seen neurologist   Sickle cell trait ([place])    Smoker    Vitamin D deficiency 10/2019   Wears glasses     Patient Active Problem List   Diagnosis Date Noted   DM (diabetes mellitus), type 2, uncontrolled, with renal complications ([place]) 00/37/0488   Intractable nausea and vomiting 03/23/2021   Serotonin syndrome 03/14/2021    Overdose, undetermined intent, initial encounter 03/14/2021   Dyslipidemia 02/16/2021   Insulin resistance 02/16/2021   Type 2 diabetes mellitus without complication, with long-term current use of insulin ([place]) 02/16/2021   Amitriptyline overdose, accidental or unintentional, initial encounter 01/09/2021   Ingestion of substance, undetermined intent, initial encounter 01/08/2021   Hyperglycemia due to type 2 diabetes mellitus ([place]) 01/08/2021   SIRS (systemic inflammatory response syndrome) ([place]) 01/08/2021   Exertional chest pain 12/31/2020   SOB (shortness of breath) 12/24/2020   DKA (diabetic ketoacidosis) ([place]) 10/15/2020   Hyperkalemia 10/15/2020   Severe sepsis ([place]) 89/16/9450   Acute metabolic encephalopathy 38/88/2800   Sinus tachycardia 10/14/2020   Precordial pain 10/14/2020   Abnormal findings on diagnostic imaging of lung 08/19/2020   Physical deconditioning 08/19/2020   History of endometrial ablation 07/27/2020   History of alcohol use 07/20/2020   Current moderate episode of major depressive disorder without prior episode ([place]) 07/20/2020   Sickle cell trait ([place]) 07/19/2020   Elevated d-dimer 07/13/2020   Healthcare maintenance 07/12/2020   Exertional dyspnea 07/12/2020   Atypical chest pain 07/12/2020   At risk for obstructive sleep apnea 07/12/2020   DKA (diabetic ketoacidoses) 07/02/2020   Acute on chronic diastolic (congestive) heart failure (HCC) 06/22/2020   Generalized abdominal pain 03/08/2020   Chronic diastolic CHF (congestive heart failure) ([place]) 03/08/2020   Chronic anemia 12/19/2019   Elevated sed rate 11/26/2019   Elevated C-reactive  protein (CRP) 11/26/2019   Hypoglycemia 02/07/2019   Hypotension 02/07/2019   Lactic acidosis 02/07/2019   Right ankle pain 02/07/2019   Chronic obstructive pulmonary disease ([place]) 02/05/2019   Vitamin D deficiency 05/31/2017   Gastroesophageal reflux disease 05/24/2017   Abnormal uterine bleeding (AUB) 05/17/2017    Anemia of chronic disease 04/06/2017   Knee pain, chronic 03/27/2016   Essential hypertension 03/27/2016   Obesity, Class III, BMI 40-49.9 (morbid obesity) ([place]) 03/27/2016   Irritable bowel syndrome with constipation 03/27/2016   Tobacco dependence 03/27/2016   Arthropathy, lower leg 05/04/2013   CTS (carpal tunnel syndrome) 05/04/2013   Peripheral edema 05/04/2013   Chronic pain 05/13/2012   Diabetic gastroparesis ([place]) 11/06/2006   Hot flashes 11/06/2006    Past Surgical History:  Procedure Laterality Date   CESAREAN SECTION     x 1. for twins   DILATION AND CURETTAGE OF UTERUS N/A 08/20/2019   Procedure: DILATATION AND CURETTAGE;  Surgeon: Emily Filbert, MD;  Location: [facility];  Service: Gynecology;  Laterality: N/A;   ENDOMETRIAL ABLATION N/A 08/20/2019   Procedure: [place] Ablation;  Surgeon: Emily Filbert, MD;  Location: [facility];  Service: Gynecology;  Laterality: N/A;   EYE SURGERY Bilateral    laser right and cataract removed left eye   RADIOLOGY WITH ANESTHESIA N/A 09/16/2019   Procedure: MRI WITH ANESTHESIA   L SPINE WITHOUT CONTRAST, T SPINE WITHOUT CONTRAST , CERVICAL WITHOUT CONTRAST;  Surgeon: Radiologist, Medication, MD;  Location: [facility];  Service: Radiology;  Laterality: N/A;   TUBAL LIGATION     interval BTL   UPPER GI ENDOSCOPY  07/2017     OB History     Gravida  3   Para      Term      Preterm      AB      Living  3      SAB      IAB      Ectopic      Multiple      Live Births  3        Obstetric Comments  Vaginal x 1 and then c-section for twins         Family History  Problem Relation Age of Onset   Diabetes Mother    Hypertension Mother     Social History   Tobacco Use   Smoking status: Some Days    Packs/day: 0.25    Years: 26.00    Pack years: 6.50    Types: Cigarettes    Last attempt to quit: 09/13/2020    Years since quitting: 0.6   Smokeless tobacco: Never   Tobacco comments:    4-5 cigarettes/day  Vaping Use    Vaping Use: Never used  Substance Use Topics   Alcohol use: No   Drug use: No    Home Medications Prior to Admission medications   Medication Sig Start Date End Date Taking? Authorizing Provider  ACCU-CHEK GUIDE test strip USE AS DIRECTED UP TO FOUR TIMES DAILY Patient taking differently: 1 each by Other route in the morning, at noon, in the evening, and at bedtime. 11/11/20   Vevelyn Francois, NP  Accu-Chek Softclix Lancets lancets USE AS DIRECTED UP TO FOUR TIMES DAILY 02/17/21   Vevelyn Francois, NP  albuterol (PROVENTIL) (2.5 MG/3ML) 0.083% nebulizer solution Take 3 mLs (2.5 mg total) by nebulization every 6 (six) hours as needed for wheezing or shortness of breath. 09/27/20   June Leap  L, DO  albuterol (VENTOLIN HFA) 108 (90 Base) MCG/ACT inhaler Inhale 2 puffs into the lungs every 6 (six) hours as needed for wheezing or shortness of breath. 09/27/20   June Leap L, DO  blood glucose meter kit and supplies KIT 1 each by Other route See admin instructions. Dispense based on patient and insurance preference. Use up to four times daily as directed. (FOR ICD-9 250.00, 250.01). 09/02/20   Vevelyn Francois, NP  budesonide-formoterol (SYMBICORT) 160-4.5 MCG/ACT inhaler INHALE 2 PUFFS INTO THE LUNGS 2 (TWO) TIMES DAILY. Patient taking differently: Inhale 2 puffs into the lungs 2 (two) times daily. 09/27/20   Icard, Octavio Graves, DO  cetirizine (ZYRTEC) 10 MG tablet Take 10 mg by mouth daily. 02/25/21   [provider]  Dexlansoprazole (DEXILANT) 30 MG capsule Take 1 capsule (30 mg total) by mouth daily. 04/13/21 04/13/22  Vevelyn Francois, NP  diltiazem (CARDIZEM CD) 180 MG 24 hr capsule Take 180 mg by mouth daily. 02/11/21   [provider]  empagliflozin (JARDIANCE) 10 MG TABS tablet Take 1 tablet (10 mg total) by mouth daily before breakfast. 02/21/21   Shamleffer, Melanie Crazier, MD  ferrous sulfate 325 (65 FE) MG tablet Take 1 tablet (325 mg total) by mouth 3 (three) times daily  with meals. 12/02/20 12/02/21  Vevelyn Francois, NP  fluticasone (FLONASE) 50 MCG/ACT nasal spray Place 2 sprays into both nostrils daily. 12/02/20   Vevelyn Francois, NP  furosemide (LASIX) 40 MG tablet TAKE 1 TABLET(40 MG) BY MOUTH TWICE DAILY Patient taking differently: Take 40 mg by mouth 2 (two) times daily. 02/08/21   Lorretta Harp, MD  Glucosamine Sulfate 1000 MG CAPS Take 1 capsule (1,000 mg total) by mouth 2 (two) times daily. Patient taking differently: Take 1,000 mg by mouth 2 (two) times daily. 08/22/19   Hilts, Legrand Como, MD  Heating Pads (HEATING PAD MOIST/DRY KING SZ) PADS 1 each by Does not apply route 4 (four) times daily as needed. 02/14/21   Raulkar, Clide Deutscher, MD  hydrochlorothiazide (MICROZIDE) 12.5 MG capsule Take 1 capsule (12.5 mg total) by mouth daily. 02/11/21 06/11/21  Cantwell, Celeste C, PA-C  HYDROcodone-acetaminophen (NORCO) 10-325 MG tablet Take 1 tablet by mouth 2 (two) times daily as needed. 03/30/21   Bayard Hugger, NP  hydrocortisone 2.5 % cream Apply topically 2 (two) times daily. Patient taking differently: Apply 1 application topically 2 (two) times daily. 09/22/20   Vevelyn Francois, NP  hydroquinone 4 % cream APPLY TOPICALLY TWICE DAILY Patient taking differently: Apply 1 application topically 2 (two) times daily as needed (pain). 09/13/20   Vevelyn Francois, NP  insulin glargine (LANTUS SOLOSTAR) 100 UNIT/ML Solostar Pen Inject 110 Units into the skin daily. 03/11/21   Shamleffer, Melanie Crazier, MD  insulin lispro (HUMALOG KWIKPEN) 100 UNIT/ML KwikPen Inject 40 Units into the skin 3 (three) times daily. 03/02/21   Shamleffer, Melanie Crazier, MD  Insulin Pen Needle 31G X 8 MM MISC 1 Device by Does not apply route in the morning, at noon, in the evening, and at bedtime. 02/16/21   Shamleffer, Melanie Crazier, MD  losartan (COZAAR) 50 MG tablet Take 1 tablet (50 mg total) by mouth daily. TAKE 1 TABLET(50MG TOTAL) BY MOUTH EVERY DAY Patient taking differently: Take 50 mg  by mouth daily. 12/02/20 12/02/21  Vevelyn Francois, NP  melatonin 3 MG TABS tablet Take 1 tablet (3 mg total) by mouth at bedtime as needed. Patient not taking: Reported on 04/15/2021  01/12/21   Arrien, Jimmy Picket, MD  metoprolol succinate (TOPROL-XL) 100 MG 24 hr tablet Take 100 mg by mouth daily. Take with or immediately following a meal.    [provider]  nicotine (NICODERM CQ - DOSED IN MG/24 HOURS) 14 mg/24hr patch Place 1 patch (14 mg total) onto the skin daily for 21 days. 04/14/21 05/05/21  Debbe Odea, MD  nicotine (NICODERM CQ) 7 mg/24hr patch Place 1 patch (7 mg total) onto the skin daily. Start only when the 14 mg patches are finished. Patient not taking: Reported on 04/15/2021 04/15/21   Debbe Odea, MD  norethindrone (AYGESTIN) 5 MG tablet Take 2 tablets (10 mg total) by mouth in the morning, at noon, in the evening, and at bedtime. With bleeding Patient not taking: Reported on 04/15/2021 01/14/21   Aletha Halim, MD  ondansetron (ZOFRAN ODT) 4 MG disintegrating tablet Take 1 tablet (4 mg total) by mouth every 8 (eight) hours as needed for nausea or vomiting. 12/23/20   Lucrezia Starch, MD  potassium chloride SA (KLOR-CON) 20 MEQ tablet Take 20 mEq by mouth daily. 11/16/20   [provider]  rosuvastatin (CRESTOR) 5 MG tablet Take 1 tablet (5 mg total) by mouth daily. 08/11/20   Lorretta Harp, MD  saxagliptin HCl (ONGLYZA) 5 MG TABS tablet Take 1 tablet (5 mg total) by mouth daily. 12/02/20 12/02/21  Vevelyn Francois, NP  senna-docusate (SENOKOT-S) 8.6-50 MG tablet Take 1 tablet by mouth at bedtime as needed for mild constipation. Patient not taking: Reported on 04/15/2021 03/24/21   Debbe Odea, MD  spironolactone (ALDACTONE) 50 MG tablet TAKE 1 TABLET(50 MG) BY MOUTH DAILY Patient taking differently: Take 50 mg by mouth daily. 12/22/20   Patwardhan, Reynold Bowen, MD  sucralfate (CARAFATE) 1 GM/10ML suspension Take 10 mLs (1 g total) by mouth 4 (four) times daily. 04/13/21  04/13/22  Vevelyn Francois, NP  Tiotropium Bromide Monohydrate (SPIRIVA RESPIMAT) 2.5 MCG/ACT AERS Inhale 2 puffs into the lungs daily. 09/27/20   Icard, Octavio Graves, DO  tiZANidine (ZANAFLEX) 4 MG tablet Take 1 tablet (4 mg total) by mouth every 6 (six) hours as needed for muscle spasms. 04/04/21   Raulkar, Clide Deutscher, MD  topiramate (TOPAMAX) 25 MG tablet Take 1 tablet (25 mg total) by mouth 2 (two) times daily. 01/31/21   Raulkar, Clide Deutscher, MD  Vitamin D, Ergocalciferol, (DRISDOL) 1.25 MG (50000 UNIT) CAPS capsule Take 1 capsule (50,000 Units total) by mouth every 7 (seven) days. Patient taking differently: Take 50,000 Units by mouth every Friday. 12/02/19   Vevelyn Francois, NP  vitamin E (VITAMIN E) 180 MG (400 UNITS) capsule Take 1 capsule (400 Units total) by mouth daily. 01/07/21   Izora Ribas, MD    Allergies    Ketoprofen, Aspirin, Gabapentin, Ibuprofen, Liraglutide, Naproxen, Omeprazole-sodium bicarbonate, Sulfa antibiotics, and Tramadol  Review of Systems   Review of Systems  Constitutional:  Positive for chills and fatigue. Negative for diaphoresis and fever.  HENT:  Negative for congestion.   Eyes:  Negative for visual disturbance.  Respiratory:  Positive for cough. Negative for chest tightness, shortness of breath and stridor.   Cardiovascular:  Negative for chest pain, palpitations and leg swelling.  Gastrointestinal:  Positive for abdominal pain, nausea and vomiting. Negative for constipation and diarrhea.  Genitourinary:  Negative for dysuria, frequency, vaginal bleeding and vaginal discharge.  Musculoskeletal:  Positive for back pain (chronic). Negative for neck pain and neck stiffness.  Skin:  Negative for  rash and wound.  Neurological:  Negative for headaches.  Psychiatric/Behavioral:  Negative for agitation and confusion.   All other systems reviewed and are negative.  Physical Exam Updated Vital Signs BP (!) 188/106 (BP Location: Left Arm)   Pulse (!) 104   Temp 99  F (37.2 C) (Oral)   Resp 18   Ht _0  (1.626 m)   Wt 99.8 kg   SpO2 100%   BMI 37.76 kg/m   Physical Exam Vitals and nursing note reviewed.  Constitutional:      General: She is not in acute distress.    Appearance: She is well-developed. She is obese. She is not ill-appearing, toxic-appearing or diaphoretic.  HENT:     Head: Normocephalic and atraumatic.     Nose: No congestion.     Mouth/Throat:     Mouth: Mucous membranes are moist.  Eyes:     Conjunctiva/sclera: Conjunctivae normal.     Pupils: Pupils are equal, round, and reactive to light.  Cardiovascular:     Rate and Rhythm: Regular rhythm. Tachycardia present.     Heart sounds: No murmur heard. Pulmonary:     Effort: Pulmonary effort is normal. No respiratory distress.     Breath sounds: Normal breath sounds. No wheezing, rhonchi or rales.  Chest:     Chest wall: No tenderness.  Abdominal:     General: Abdomen is flat. Bowel sounds are normal. There is no distension.     Palpations: Abdomen is soft.     Tenderness: There is generalized abdominal tenderness. There is no right CVA tenderness, left CVA tenderness, guarding or rebound.  Musculoskeletal:     Cervical back: Neck supple.  Skin:    General: Skin is warm and dry.     Capillary Refill: Capillary refill takes less than 2 seconds.     Findings: No erythema.  Neurological:     General: No focal deficit present.     Mental Status: She is alert.  Psychiatric:        Mood and Affect: Mood normal.    ED Results / Procedures / Treatments   Labs (all labs ordered are listed, but only abnormal results are displayed) Labs Reviewed  CBC WITH DIFFERENTIAL/PLATELET - Abnormal; Notable for the following components:      Result Value   Hemoglobin 9.4 (*)    HCT 32.1 (*)    MCV 69.6 (*)    MCH 20.4 (*)    MCHC 29.3 (*)    RDW 24.0 (*)    All other components within normal limits  COMPREHENSIVE METABOLIC PANEL - Abnormal; Notable for the following  components:   Potassium 3.1 (*)    Glucose, Bld 261 (*)    All other components within normal limits  BLOOD GAS, VENOUS - Abnormal; Notable for the following components:   pCO2, Ven 41.2 (*)    All other components within normal limits  URINALYSIS, ROUTINE W REFLEX MICROSCOPIC - Abnormal; Notable for the following components:   APPearance HAZY (*)    Specific Gravity, Urine >1.046 (*)    Glucose, UA >=500 (*)    Protein, ur 30 (*)    All other components within normal limits  CBG MONITORING, ED - Abnormal; Notable for the following components:   Glucose-Capillary 262 (*)    All other components within normal limits  CBG MONITORING, ED - Abnormal; Notable for the following components:   Glucose-Capillary 193 (*)    All other components within normal limits  RESP  PANEL BY RT-PCR (FLU A&B, COVID) [id number]  URINE CULTURE  LIPASE, BLOOD  LACTIC ACID, PLASMA  BRAIN NATRIURETIC PEPTIDE    EKG None  Radiology CT ABDOMEN PELVIS W CONTRAST  Result Date: 04/26/2021 CLINICAL DATA:  45 year old female with history of severe abdominal pain, nausea and vomiting. EXAM: CT ABDOMEN AND PELVIS WITH CONTRAST TECHNIQUE: Multidetector CT imaging of the abdomen and pelvis was performed using the standard protocol following bolus administration of intravenous contrast. CONTRAST:  45m OMNIPAQUE IOHEXOL 350 MG/ML SOLN COMPARISON:  Multiple priors, most recently 03/23/2021. FINDINGS: Lower chest: 3 mm subpleural pulmonary nodule in the right middle lobe (axial image 4 of series 3), stable compared to prior examinations dating back to 01/31/2020, considered definitively benign (presumably a subpleural lymph node). Hepatobiliary: Again noted are several geographic regions of well-defined low attenuation throughout the hepatic parenchyma, similar to prior examinations dating back to 01/31/2020, considered benign, presumably focal areas of fatty infiltration. No other new suspicious appearing hepatic lesions. No  intra or extrahepatic biliary ductal dilatation noted. Gallbladder is normal in appearance. Pancreas: No pancreatic mass. No pancreatic ductal dilatation. No pancreatic or peripancreatic fluid collections or inflammatory changes. Spleen: Unremarkable. Adrenals/Urinary Tract: Bilateral kidneys and adrenal glands are normal in appearance. No hydroureteronephrosis. Urinary bladder is normal in appearance. Stomach/Bowel: Normal appearance of the stomach. No pathologic dilatation of small bowel or colon. Normal appendix. Vascular/Lymphatic: No aneurysm identified in the visualized abdominal vasculature. No lymphadenopathy noted in the abdomen. Reproductive: Uterus and ovaries are unremarkable in appearance. Other: No significant volume of ascites.  No pneumoperitoneum. Musculoskeletal: There are no aggressive appearing lytic or blastic lesions noted in the visualized portions of the skeleton. IMPRESSION: 1. No acute findings are noted in the abdomen or pelvis to account for the patient's symptoms. 2. Once again, there are multiple well-defined geographic areas of low attenuation scattered throughout the hepatic parenchyma which are incompletely characterized on today's examination, but favored to represent geographic fatty infiltration. This could be definitively characterized with follow-up nonemergent abdominal MRI with and without IV gadolinium if clinically appropriate. Electronically Signed   By: DVinnie LangtonM.D.   On: 04/26/2021 05:16   DG Chest Portable 1 View  Result Date: 04/26/2021 CLINICAL DATA:  45year old female with chills. EXAM: PORTABLE CHEST 1 VIEW COMPARISON:  Chest radiograph dated 03/14/2021. FINDINGS: No focal consolidation, pleural effusion, pneumothorax. Stable mild cardiomegaly. No acute osseous pathology. IMPRESSION: No active disease. Electronically Signed   By: AAnner CreteM.D.   On: 04/26/2021 02:28    Procedures Procedures   Medications Ordered in ED Medications  sodium  chloride (PF) 0.9 % injection (has no administration in time range)  potassium chloride SA (KLOR-CON) CR tablet 40 mEq (has no administration in time range)  morphine 4 MG/ML injection 4 mg (4 mg Intravenous Given 04/26/21 0233)  ondansetron (ZOFRAN) injection 4 mg (4 mg Intravenous Given 04/26/21 0232)  iohexol (OMNIPAQUE) 350 MG/ML injection 100 mL (80 mLs Intravenous Contrast Given 04/26/21 0407)  morphine 4 MG/ML injection 4 mg (4 mg Intravenous Given 04/26/21 0454)  ondansetron (ZOFRAN) injection 4 mg (4 mg Intravenous Given 04/26/21 0453)    ED Course  I have reviewed the triage vital signs and the nursing notes.  Pertinent labs & imaging results that were available during my care of the patient were reviewed by me and considered in my medical decision making (see chart for details).    MDM Rules/Calculators/A&P  Jill Shaw is a 45 y.o. female with a past medical history significant for diabetes with prior DKA, CHF with chronic edema, GERD, obesity, hypertension, chronic back pain, previous diabetic gastroparesis, COPD, asthma, and seizures who presents with 1 day of worsening nausea, vomiting, abdominal pain, and fatigue.  Patient reports that yesterday afternoon, started having the symptoms which she has had on and off but have worsened today.  She reports it is 10 out of 10 abdominal pain that is all her abdomen.  Is associated with nausea and vomiting and not keeping anything down.  She reports that she has titrated her insulin so as not to be hypoglycemic as she is not keeping things down.  She reports she is having some cough but denies significant chest pain or shortness of breath.  She is unsure if she feels like she has DKA again.  She denies urinary changes and denies any constipation or diarrhea.  Denies any trauma.  Reports the pain is uncontrolled.  She reports is not imaging of her abdomen in "a long time".  On exam, lungs are clear and chest is  nontender.  Abdomen is tender to palpation diffusely but had normal bowel sounds.  No flank tenderness.  Back was tender with her chronic back pain she reports.  No focal neurologic deficits.  Mild edema in the legs which she reports is at baseline.  Exam otherwise unremarkable.  Patient reports that her glucoses were in the 400s earlier today, a CBG on arrival was in the 200s.  Lower suspicion for DKA will get labs to rule this out.  Due to the diffuse abdominal tenderness and her report that she is not tolerating p.o., will get CT imaging to look for obstruction, cholecystitis, or other acute abnormality.  We will get urinalysis to help rule out DKA versus look for other causes.  We will give her some pain medicine, and nausea medicine and see what her results show.  Anticipate reassessment.  Work-up showed mild hypokalemia but otherwise work-up is reassuring.  No concerning findings discovered.  Patient was starting to feel better.  Unclear etiology of all of her symptoms but there is not appear to be an emergent etiology at this time.  As she is feeling better and work-up reassuring, patient will be discharged to follow-up with PCP.  Patient agrees and was discharged in good condition after oral potassium repletion.     Final Clinical Impression(s) / ED Diagnoses Final diagnoses:  Low serum potassium  Non-intractable vomiting with nausea, unspecified vomiting type  Generalized abdominal pain    Rx / DC Orders ED Discharge Orders     None      Clinical Impression: 1. Low serum potassium   2. Non-intractable vomiting with nausea, unspecified vomiting type   3. Generalized abdominal pain     Disposition: Discharge  Condition: Good  I have discussed the results, Dx and Tx plan with the pt(& family if present). He/she/they expressed understanding and agree(s) with the plan. Discharge instructions discussed at great length. Strict return precautions discussed and pt &/or family have  verbalized understanding of the instructions. No further questions at time of discharge.    New Prescriptions   No medications on file    Follow Up: Vevelyn Francois, NP [address] [place] [address] [phone]     [facility]-EMERGENCY DEPT [place] [id number] [place]       Fitz Matsuo, Gwenyth Allegra, MD  04/26/21 1607

## 2021-04-26 NOTE — Discharge Instructions (Addendum)
Your work-up today was overall reassuring.  We did discover some low potassium which we corrected today.  We did not find any concerning causes of your symptoms however given the recurrence of your episodes, we do feel you need to follow-up with a primary doctor for further management.  Please rest and stay hydrated.  If any symptoms change or worsen acutely, please return to the nearest emergency department.

## 2021-04-26 NOTE — ED Notes (Signed)
Assisted patient to the bedside commode for urine sample. Patient states pain is 9/10 and she still feels nauseous.

## 2021-04-27 ENCOUNTER — Telehealth: Payer: Self-pay | Admitting: Physical Medicine and Rehabilitation

## 2021-04-27 ENCOUNTER — Other Ambulatory Visit: Payer: Self-pay | Admitting: Physical Medicine and Rehabilitation

## 2021-04-27 ENCOUNTER — Ambulatory Visit: Payer: Medicaid Other | Admitting: Pulmonary Disease

## 2021-04-27 LAB — URINE CULTURE

## 2021-04-27 MED ORDER — HYDROCODONE-ACETAMINOPHEN 10-325 MG PO TABS: 1.0000 | ORAL_TABLET | Freq: Two times a day (BID) | ORAL | 0 refills | Status: DC | PRN

## 2021-04-27 NOTE — Telephone Encounter (Signed)
Due to Jill Shaw out sick please send in medications for patient. She needs Hydrocodone refilled

## 2021-04-28 ENCOUNTER — Telehealth: Payer: Self-pay | Admitting: Pharmacy Technician

## 2021-04-28 NOTE — Telephone Encounter (Signed)
ENDOCRINOLOGY Patient Advocate Encounter  Left a HIPPA compliant voicemail with the patient.  When they return call, they need to be asked what the strength their Humalog Stephanie Coup is currently.  I spoke with [facility] and they had Humalog 200U/ML Kwikpen Inj and it  IS SUPPOSED TO BE  Humalog 100U/ML Kwikpen.  If she has the higher strength the doctor needs to instruct on how to use properly.    The correct prescription will be payable through insurance May 01, 2021.    Netty Starring. Dimas Aguas CPhT Specialty Pharmacy Patient [facility] Phone: [phone] Fax:  [phone]

## 2021-04-29 ENCOUNTER — Encounter: Payer: Medicaid Other | Admitting: Registered Nurse

## 2021-04-29 NOTE — Telephone Encounter (Signed)
Patient returned call from this office -requested call back to [phone]

## 2021-05-02 ENCOUNTER — Telehealth: Payer: Self-pay | Admitting: Nurse Practitioner

## 2021-05-02 NOTE — Telephone Encounter (Signed)
Called patient to schedule appt with Jill Shaw to discuss patient's concerns as stated in MyChart message

## 2021-05-03 NOTE — Telephone Encounter (Signed)
[facility] Patient Advocate Encounter  Left a HIPPA compliant voicemail with the patient.  When they return call, let them know they can refill medication with their pharmacy.  Pharmacy indicated they may have filled the wrong medication, at a higher strength.    Netty Starring. Dimas Aguas CPhT Specialty Pharmacy Patient [facility] for Infectious Disease Phone: [phone] Fax:  [phone]

## 2021-05-05 ENCOUNTER — Telehealth (INDEPENDENT_AMBULATORY_CARE_PROVIDER_SITE_OTHER): Payer: Medicaid Other | Admitting: Nurse Practitioner

## 2021-05-05 ENCOUNTER — Other Ambulatory Visit: Payer: Self-pay

## 2021-05-05 DIAGNOSIS — R109 Unspecified abdominal pain: Secondary | ICD-10-CM | POA: Diagnosis not present

## 2021-05-05 DIAGNOSIS — R059 Cough, unspecified: Secondary | ICD-10-CM | POA: Diagnosis not present

## 2021-05-05 DIAGNOSIS — G8929 Other chronic pain: Secondary | ICD-10-CM | POA: Diagnosis not present

## 2021-05-05 DIAGNOSIS — K769 Liver disease, unspecified: Secondary | ICD-10-CM

## 2021-05-05 MED ORDER — BENZONATATE 100 MG PO CAPS: 100.0000 mg | ORAL_CAPSULE | Freq: Three times a day (TID) | ORAL | 0 refills | Status: DC | PRN

## 2021-05-05 MED ORDER — ADULT BLOOD PRESSURE CUFF LG KIT: 1.0000 | PACK | Freq: Every day | 0 refills | Status: DC

## 2021-05-05 NOTE — Progress Notes (Signed)
[place], [place]  [address] Phone:  [phone]   Fax:  [phone] Virtual Visit via Telephone Note  I connected with Jill Shaw on 05/05/21 at  3:20 PM EDT by telephone and verified that I am speaking with the correct person using two identifiers.   I discussed the limitations, risks, security and privacy concerns of performing an evaluation and management service by telephone and the availability of in person appointments. I also discussed with the patient that there may be a patient responsible charge related to this service. The patient expressed understanding and agreed to proceed.  Patient home Provider Office  History of Present Illness:  Jill Shaw  has a past medical history of Anemia, Arthritis, Asthma, Chronic diastolic (congestive) heart failure ([place]), COPD (chronic obstructive pulmonary disease) ([place]), Diabetes mellitus without complication ([place]), Dysfunctional uterine bleeding, GERD (gastroesophageal reflux disease), Hypertension, Neuromuscular disorder ([place]), Seizures ([place]) (09/12/2017), Sickle cell trait ([place]), Smoker, Vitamin D deficiency (10/2019), and Wears glasses.  Abdominal Pain Patient complains of abdominal pain. She was recently in the hospital and her abdominal CT scan was abnormal. She would like to have this further evaluated.    EXAM: CT ABDOMEN AND PELVIS WITH CONTRAST IMPRESSION: 1. No acute findings are noted in the abdomen or pelvis to account for the patient's symptoms. 2. Once again, there are multiple well-defined geographic areas of low attenuation scattered throughout the hepatic parenchyma which are incompletely characterized on today's examination, but favored to represent geographic fatty infiltration. This could be definitively characterized with follow-up nonemergent abdominal MRI with and without IV gadolinium if clinically appropriate.   ROS   Observations/Objective: No exam;  telephone visit  Assessment and Plan:     Assessment & Plan:   Problem List Items Addressed This Visit   None Visit Diagnoses     Chronic abdominal pain    -  Primary   Cough       Liver disease       Relevant Orders   MR Abdomen W Wo Contrast        Meds ordered this encounter  Medications   Blood Pressure Monitoring (ADULT BLOOD PRESSURE CUFF LG) KIT    Sig: 1 kit by Does not apply route daily.    Dispense:  1 kit    Refill:  0    Order Specific Question:   Supervising Provider    Answer:   Tresa Garter [[id number]]   benzonatate (TESSALON) 100 MG capsule    Sig: Take 1 capsule (100 mg total) by mouth 3 (three) times daily as needed for up to 10 days for cough. Never suck or chew on a benzonatate capsule.    Dispense:  30 capsule    Refill:  0    Do not place medication on "Automatic Refill". Please provide patient with medication counseling and recommendations.    Order Specific Question:   Supervising Provider    Answer:   Tresa Garter [[id number]]     Vevelyn Francois, NP   Follow Up Instructions:    I discussed the assessment and treatment plan with the patient. The patient was provided an opportunity to ask questions and all were answered. The patient agreed with the plan and demonstrated an understanding of the instructions.   The patient was advised to call back or seek an in-person evaluation if the symptoms worsen or if the condition fails to improve as anticipated.  I provided 21  minutes of telephone- visit time during this encounter.   Vevelyn Francois, NP

## 2021-05-10 ENCOUNTER — Encounter: Payer: Medicaid Other | Admitting: Registered Nurse

## 2021-05-11 ENCOUNTER — Other Ambulatory Visit (HOSPITAL_COMMUNITY): Payer: Self-pay

## 2021-05-12 ENCOUNTER — Other Ambulatory Visit: Payer: Self-pay

## 2021-05-12 MED ORDER — SUCRALFATE 1 GM/10ML PO SUSP: 1.0000 g | Freq: Four times a day (QID) | ORAL | 1 refills | Status: DC

## 2021-05-13 ENCOUNTER — Telehealth: Payer: Self-pay | Admitting: Registered Nurse

## 2021-05-13 NOTE — Telephone Encounter (Signed)
error 

## 2021-05-16 ENCOUNTER — Other Ambulatory Visit: Payer: Self-pay | Admitting: Nurse Practitioner

## 2021-05-18 ENCOUNTER — Other Ambulatory Visit: Payer: Self-pay | Admitting: Nurse Practitioner

## 2021-05-18 MED ORDER — SUCRALFATE 1 GM/10ML PO SUSP: 1.0000 g | Freq: Four times a day (QID) | ORAL | 3 refills | Status: DC

## 2021-05-23 ENCOUNTER — Ambulatory Visit: Payer: Medicaid Other

## 2021-05-23 ENCOUNTER — Other Ambulatory Visit: Payer: Self-pay

## 2021-05-23 DIAGNOSIS — R079 Chest pain, unspecified: Secondary | ICD-10-CM

## 2021-05-23 NOTE — Telephone Encounter (Signed)
Can someone call the pt and have her scheduled for a monitor soon

## 2021-05-24 ENCOUNTER — Other Ambulatory Visit: Payer: Self-pay | Admitting: Obstetrics and Gynecology

## 2021-05-24 DIAGNOSIS — N939 Abnormal uterine and vaginal bleeding, unspecified: Secondary | ICD-10-CM

## 2021-05-26 ENCOUNTER — Other Ambulatory Visit: Payer: Self-pay

## 2021-05-26 ENCOUNTER — Emergency Department (HOSPITAL_COMMUNITY)
Admission: EM | Admit: 2021-05-26 | Discharge: 2021-05-26 | Disposition: A | Discharge status: Home / Self Care | Payer: Medicaid Other | Attending: Emergency Medicine | Admitting: Emergency Medicine

## 2021-05-26 ENCOUNTER — Emergency Department (HOSPITAL_COMMUNITY): Payer: Medicaid Other

## 2021-05-26 ENCOUNTER — Encounter (HOSPITAL_COMMUNITY): Payer: Self-pay

## 2021-05-26 DIAGNOSIS — Z5321 Procedure and treatment not carried out due to patient leaving prior to being seen by health care provider: Secondary | ICD-10-CM | POA: Diagnosis not present

## 2021-05-26 DIAGNOSIS — M25519 Pain in unspecified shoulder: Secondary | ICD-10-CM | POA: Insufficient documentation

## 2021-05-26 DIAGNOSIS — M79604 Pain in right leg: Secondary | ICD-10-CM | POA: Insufficient documentation

## 2021-05-26 DIAGNOSIS — M545 Low back pain, unspecified: Secondary | ICD-10-CM | POA: Insufficient documentation

## 2021-05-26 DIAGNOSIS — S32010A Wedge compression fracture of first lumbar vertebra, initial encounter for closed fracture: Secondary | ICD-10-CM | POA: Diagnosis not present

## 2021-05-26 DIAGNOSIS — R739 Hyperglycemia, unspecified: Secondary | ICD-10-CM | POA: Insufficient documentation

## 2021-05-26 DIAGNOSIS — W19XXXA Unspecified fall, initial encounter: Secondary | ICD-10-CM | POA: Diagnosis not present

## 2021-05-26 DIAGNOSIS — M79605 Pain in left leg: Secondary | ICD-10-CM | POA: Diagnosis not present

## 2021-05-26 DIAGNOSIS — G8929 Other chronic pain: Secondary | ICD-10-CM | POA: Insufficient documentation

## 2021-05-26 DIAGNOSIS — Y92009 Unspecified place in unspecified non-institutional (private) residence as the place of occurrence of the external cause: Secondary | ICD-10-CM | POA: Insufficient documentation

## 2021-05-26 LAB — I-STAT CHEM 8, ED
BUN: 14 mg/dL (ref 6–20)
Calcium, Ion: 1.21 mmol/L (ref 1.15–1.40)
Chloride: 105 mmol/L (ref 98–111)
Creatinine, Ser: 0.9 mg/dL (ref 0.44–1.00)
Glucose, Bld: 299 mg/dL — ABNORMAL HIGH (ref 70–99)
HCT: 37 % (ref 36.0–46.0)
Hemoglobin: 12.6 g/dL (ref 12.0–15.0)
Potassium: 3.4 mmol/L — ABNORMAL LOW (ref 3.5–5.1)
Sodium: 140 mmol/L (ref 135–145)
TCO2: 23 mmol/L (ref 22–32)

## 2021-05-26 LAB — CBG MONITORING, ED: Glucose-Capillary: 176 mg/dL — ABNORMAL HIGH (ref 70–99)

## 2021-05-26 LAB — I-STAT BETA HCG BLOOD, ED (MC, WL, AP ONLY): I-stat hCG, quantitative: <5 m[IU]/mL (ref <5)

## 2021-05-26 MED ORDER — LIDOCAINE 5 % EX PTCH: 1.0000 | MEDICATED_PATCH | CUTANEOUS | Status: DC

## 2021-05-26 NOTE — ED Notes (Signed)
Pt left AMA °

## 2021-05-26 NOTE — ED Provider Notes (Signed)
Emergency Medicine Provider Triage Evaluation Note  Jill Shaw , a 45 y.o. female  was evaluated in triage.  Pt complains of low back pain onset at 1300 yesterday. States her R leg gave out and she fell backwards onto her back. Pain is constant and unrelieved with Tylenol and hydrocodone. Pain radiates to BLE. Has been able to get around at home despite the fall and persistent pain. No genital or perianal numbness, bowel or bladder incontinence, head trauma, LOC.  Review of Systems  Positive: Back pain Negative: Incontinence   Physical Exam  There were no vitals taken for this visit. Gen:   Awake, no distress   Resp:  Normal effort  MSK:   Moves extremities without difficulty  Other:  Unable to graze hand across low back without patient jumping abruptly in the wheelchair. Moves all extremities spontaneously.  Medical Decision Making  Medically screening exam initiated at 4:07 AM.  Appropriate orders placed.  CASY TAVANO was informed that the remainder of the evaluation will be completed by another provider, this initial triage assessment does not replace that evaluation, and the importance of remaining in the ED until their evaluation is complete.  Acute on chronic back pain.   Antony Madura, PA-C 05/26/21 0410    Tilden Fossa, MD 05/26/21 [phone]

## 2021-05-26 NOTE — ED Triage Notes (Signed)
Brought in by [facility] - Right leg gave out and fell - c/o bilateral leg pain, back pain, and shoulder pain.

## 2021-05-27 ENCOUNTER — Ambulatory Visit: Payer: Medicaid Other | Admitting: Registered Nurse

## 2021-06-01 ENCOUNTER — Other Ambulatory Visit: Payer: Self-pay

## 2021-06-01 ENCOUNTER — Inpatient Hospital Stay: Payer: Medicaid Other

## 2021-06-01 ENCOUNTER — Ambulatory Visit: Payer: Medicaid Other

## 2021-06-01 DIAGNOSIS — R0609 Other forms of dyspnea: Secondary | ICD-10-CM

## 2021-06-01 DIAGNOSIS — R0600 Dyspnea, unspecified: Secondary | ICD-10-CM

## 2021-06-01 DIAGNOSIS — R079 Chest pain, unspecified: Secondary | ICD-10-CM

## 2021-06-06 ENCOUNTER — Other Ambulatory Visit: Payer: Self-pay

## 2021-06-06 ENCOUNTER — Other Ambulatory Visit: Payer: Self-pay | Admitting: Cardiovascular Disease

## 2021-06-06 ENCOUNTER — Encounter: Payer: Self-pay | Admitting: Registered Nurse

## 2021-06-06 ENCOUNTER — Encounter: Payer: Medicaid Other | Attending: Physical Medicine and Rehabilitation | Admitting: Registered Nurse

## 2021-06-06 VITALS — BP 164/104 | HR 108 | Temp 99.0°F | Ht 64.0 in | Wt 262.0 lb

## 2021-06-06 DIAGNOSIS — M62838 Other muscle spasm: Secondary | ICD-10-CM

## 2021-06-06 DIAGNOSIS — G4701 Insomnia due to medical condition: Secondary | ICD-10-CM | POA: Diagnosis not present

## 2021-06-06 DIAGNOSIS — Z79899 Other long term (current) drug therapy: Secondary | ICD-10-CM | POA: Diagnosis not present

## 2021-06-06 DIAGNOSIS — M170 Bilateral primary osteoarthritis of knee: Secondary | ICD-10-CM | POA: Diagnosis not present

## 2021-06-06 DIAGNOSIS — G629 Polyneuropathy, unspecified: Secondary | ICD-10-CM | POA: Diagnosis not present

## 2021-06-06 DIAGNOSIS — I10 Essential (primary) hypertension: Secondary | ICD-10-CM | POA: Diagnosis not present

## 2021-06-06 DIAGNOSIS — M5416 Radiculopathy, lumbar region: Secondary | ICD-10-CM | POA: Diagnosis not present

## 2021-06-06 DIAGNOSIS — Z5181 Encounter for therapeutic drug level monitoring: Secondary | ICD-10-CM | POA: Diagnosis not present

## 2021-06-06 DIAGNOSIS — G894 Chronic pain syndrome: Secondary | ICD-10-CM | POA: Diagnosis not present

## 2021-06-06 DIAGNOSIS — M797 Fibromyalgia: Secondary | ICD-10-CM

## 2021-06-06 DIAGNOSIS — R000 Tachycardia, unspecified: Secondary | ICD-10-CM | POA: Diagnosis not present

## 2021-06-06 MED ORDER — HYDROCODONE-ACETAMINOPHEN 10-325 MG PO TABS: 1.0000 | ORAL_TABLET | Freq: Two times a day (BID) | ORAL | 0 refills | Status: DC | PRN

## 2021-06-06 NOTE — Progress Notes (Signed)
Subjective:    Patient ID: Jill Shaw, female    DOB: 28-Nov-1975, 45 y.o.   MRN: [id number]  HPI: Jill Shaw is a 45 y.o. female who returns for follow up appointment for chronic pain and medication refill. She states her pain is located in her right shoulder, lower back pain radiating into her bilateral lower extremities with tingling and burning. Also reports bilateral knee pain. She rates her pain 10. Her current exercise regime is walking and performing stretching exercises.  Jill Shaw arrived to office with uncontrolled hypertension and tachycardia, blood pressure and apical pulse re-checked. Jill Shaw states she is compliant with her anti-hypertensive medication. She refuses ED evaluation, she will F/U with her PCP, she states.   On 05/26/2021, she went to [facility] ED after she had fallen, she reports her right lower extremity gave out and she fell backwards.   Jill Shaw Morphine equivalent is 20.00  MME.        Pain Inventory Average Pain 10 Pain Right Now 10 My pain is sharp, burning, dull, stabbing, tingling, and aching  In the last 24 hours, has pain interfered with the following? General activity 10 Relation with others 9 Enjoyment of life 10 What TIME of day is your pain at its worst? morning , daytime, evening, and night Sleep (in general) Poor  Pain is worse with: walking, bending, sitting, inactivity, standing, and some activites Pain improves with: heat/ice and medication Relief from Meds: 10  Family History  Problem Relation Age of Onset   Diabetes Mother    Hypertension Mother    Social History   Socioeconomic History   Marital status: Legally Separated    Spouse name: Not on file   Number of children: 3   Years of education: Not on file   Highest education level: Not on file  Occupational History   Occupation: unemployed  Tobacco Use   Smoking status: Some Days    Packs/day: 0.25    Years: 26.00    Pack years: 6.50    Types:  Cigarettes    Last attempt to quit: 09/13/2020    Years since quitting: 0.7   Smokeless tobacco: Never   Tobacco comments:    4-5 cigarettes/day  Vaping Use   Vaping Use: Never used  Substance and Sexual Activity   Alcohol use: No   Drug use: No   Sexual activity: Not Currently    Birth control/protection: None  Other Topics Concern   Not on file  Social History Narrative   Right Handed   Lives in a one story apartment, but lives on the second floor   Drinks caffeine once in awhile   Social Determinants of Health   Financial Resource Strain: Not on file  Food Insecurity: No Food Insecurity   Worried About Programme researcher, broadcasting/film/video in the Last Year: Never true   Ran Out of Food in the Last Year: Never true  Transportation Needs: No Transportation Needs   Lack of Transportation (Medical): No   Lack of Transportation (Non-Medical): No  Physical Activity: Not on file  Stress: Not on file  Social Connections: Not on file   Past Surgical History:  Procedure Laterality Date   CESAREAN SECTION     x 1. for twins   DILATION AND CURETTAGE OF UTERUS N/A 08/20/2019   Procedure: DILATATION AND CURETTAGE;  Surgeon: Allie Bossier, MD;  Location: [facility] OR;  Service: Gynecology;  Laterality: N/A;   ENDOMETRIAL ABLATION N/A 08/20/2019  Procedure: Minerva Ablation;  Surgeon: Allie Bossier, MD;  Location: [facility];  Service: Gynecology;  Laterality: N/A;   EYE SURGERY Bilateral    laser right and cataract removed left eye   RADIOLOGY WITH ANESTHESIA N/A 09/16/2019   Procedure: MRI WITH ANESTHESIA   L SPINE WITHOUT CONTRAST, T SPINE WITHOUT CONTRAST , CERVICAL WITHOUT CONTRAST;  Surgeon: Radiologist, Medication, MD;  Location: [facility] OR;  Service: Radiology;  Laterality: N/A;   TUBAL LIGATION     interval BTL   UPPER GI ENDOSCOPY  07/2017   Past Surgical History:  Procedure Laterality Date   CESAREAN SECTION     x 1. for twins   DILATION AND CURETTAGE OF UTERUS N/A 08/20/2019   Procedure: DILATATION AND  CURETTAGE;  Surgeon: Allie Bossier, MD;  Location: [facility] OR;  Service: Gynecology;  Laterality: N/A;   ENDOMETRIAL ABLATION N/A 08/20/2019   Procedure: Minerva Ablation;  Surgeon: Allie Bossier, MD;  Location: [facility] OR;  Service: Gynecology;  Laterality: N/A;   EYE SURGERY Bilateral    laser right and cataract removed left eye   RADIOLOGY WITH ANESTHESIA N/A 09/16/2019   Procedure: MRI WITH ANESTHESIA   L SPINE WITHOUT CONTRAST, T SPINE WITHOUT CONTRAST , CERVICAL WITHOUT CONTRAST;  Surgeon: Radiologist, Medication, MD;  Location: [facility] OR;  Service: Radiology;  Laterality: N/A;   TUBAL LIGATION     interval BTL   UPPER GI ENDOSCOPY  07/2017   Past Medical History:  Diagnosis Date   Anemia    Arthritis    knees, hands   Asthma    Chronic diastolic (congestive) heart failure (HCC)    COPD (chronic obstructive pulmonary disease) (HCC)    Diabetes mellitus without complication (HCC)    type 2   Dysfunctional uterine bleeding    GERD (gastroesophageal reflux disease)    Hypertension    Neuromuscular disorder (HCC)    neuropathy feet   Seizures (HCC) 09/12/2017   pt states r/t stress and blood sugar - no meds last one 4 months ago, not seen neurologist   Sickle cell trait (HCC)    Smoker    Vitamin D deficiency 10/2019   Wears glasses    BP (!) 164/104 (BP Location: Left Arm, Patient Position: Sitting, Cuff Size: Large)   Pulse (!) 108   Temp 99 F (37.2 C)   Ht 5\' 4"  (1.626 m)   Wt 262 lb (118.8 kg)   BMI 44.97 kg/m   Opioid Risk Score:   Fall Risk Score:  `1  Depression screen PHQ 2/9  Depression screen [facility] 2/9 04/15/2021 03/30/2021 03/22/2021 01/31/2021 01/20/2021 01/07/2021 10/27/2020  Decreased Interest 2 0 1 1 0 0 0  Down, Depressed, Hopeless 2 0 1 1 0 0 0  PHQ - 2 Score 4 0 2 2 0 0 0  Altered sleeping 3 - - - 3 - 3  Tired, decreased energy 3 - - - 3 - 2  Change in appetite 0 - - - 0 - 0  Feeling bad or failure about yourself  0 - - - 0 - 0  Trouble concentrating 2 - - - 1 - 0   Moving slowly or fidgety/restless 0 - - - 0 - 0  Suicidal thoughts 0 - - - 0 - 0  PHQ-9 Score 12 - - - 7 - 5  Difficult doing work/chores - - - - - - -  Some recent data might be hidden    Review of Systems  Constitutional: Negative.  HENT: Negative.    Eyes: Negative.   Respiratory: Negative.    Cardiovascular: Negative.   Gastrointestinal: Negative.   Endocrine: Negative.   Genitourinary: Negative.   Musculoskeletal:  Positive for back pain.  Skin: Negative.   Allergic/Immunologic: Negative.   Neurological: Negative.   Hematological: Negative.   Psychiatric/Behavioral: Negative.    All other systems reviewed and are negative.     Objective:   Physical Exam Vitals and nursing note reviewed.  Constitutional:      Appearance: Normal appearance.  Cardiovascular:     Rate and Rhythm: Normal rate and regular rhythm.     Pulses: Normal pulses.     Heart sounds: Normal heart sounds.  Pulmonary:     Effort: Pulmonary effort is normal.     Breath sounds: Normal breath sounds.  Musculoskeletal:     Cervical back: Normal range of motion and neck supple.     Comments: Normal Muscle Bulk and Muscle Testing Reveals:  Upper Extremities:Full  ROM and Muscle Strength 5/5 Right AC Joint Tenderness Lumbar Paraspinal Tenderness: Right Side : L-3-L-5 Bilateral Greater Trochanter Tenderness Lower Extremities: Full ROM and Muscle Strength 5/5 Arises from chair with ease  Narrow Based Gait     Skin:    General: Skin is warm and dry.  Neurological:     Mental Status: She is alert and oriented to person, place, and time.  Psychiatric:        Mood and Affect: Mood normal.        Behavior: Behavior normal.         Assessment & Plan:  1. Lumbar Radiculitis: Continue current medication regimen. Continue HEP as Tolerated.06/06/2021 2. Fibromyalgia/Polyneuropathy: Continue HEP as Tolerated. Continue current Medication regimen. Continue to Monitor. 06/06/2021 3. Bilateral Knee Pain:  No complaints today. Continue to Monitor.06/06/2021 4. Chronic Pain Syndrome: Refilled: Hydrocodone 10/325mg  one tablet twice a day as needed for pain #60. We will continue the opioid monitoring program, this consists of regular clinic visits, examinations, urine drug screen, pill counts as well as use of [place] Controlled Substance Reporting system. A 12 month History has been reviewed on the [place] Controlled Substance Reporting System on 06/06/2021.  5. Tachycardia: Apical pulse check. Jill Shaw will F/U with her PCP Continue to monitor.  06/06/2021 6. Uncontrolled Hypertension: Refuses ED evaluation. Blood pressure was re-checked. Jill Shaw states she is compliant with her medication, she will F/U with her PCP. We will continue to monitor.    F/U in 1 month

## 2021-06-06 NOTE — Progress Notes (Signed)
UDS cancelled by provider. Patient has not taken medication in over a week.

## 2021-06-07 ENCOUNTER — Other Ambulatory Visit: Payer: Self-pay

## 2021-06-07 MED ORDER — CETIRIZINE HCL 10 MG PO TABS: 10.0000 mg | ORAL_TABLET | Freq: Every day | ORAL | 3 refills | Status: DC

## 2021-06-07 NOTE — Progress Notes (Signed)
She is seeing you next month. I had seen her in 01/2021 for chest pain (MSK) and DOE. She then missed follow up appt.

## 2021-06-08 ENCOUNTER — Telehealth: Payer: Self-pay | Admitting: *Deleted

## 2021-06-08 ENCOUNTER — Other Ambulatory Visit: Payer: Self-pay | Admitting: *Deleted

## 2021-06-08 ENCOUNTER — Ambulatory Visit: Payer: Medicaid Other | Admitting: Pulmonary Disease

## 2021-06-08 ENCOUNTER — Encounter: Payer: Self-pay | Admitting: *Deleted

## 2021-06-08 DIAGNOSIS — N939 Abnormal uterine and vaginal bleeding, unspecified: Secondary | ICD-10-CM

## 2021-06-08 MED ORDER — NORETHINDRONE ACETATE 5 MG PO TABS: 10.0000 mg | ORAL_TABLET | Freq: Four times a day (QID) | ORAL | 0 refills | Status: DC

## 2021-06-08 NOTE — Telephone Encounter (Signed)
Received a voicemail message from this am that she is Jill Shaw calling from [facility] - states Srinidhi has a medication that needs prior authorization- Norethindrone. Requests prior auth be completed and patient called. Kierria Feigenbaum,RN

## 2021-06-09 NOTE — Telephone Encounter (Signed)
Per conversation with pharmacist on 8/24, the Rx for Norethindrone is approved and the current authorization is in effect until November. Pt will need a new authorization after that time. A Mychart message was sent to pt yesterday with this information. I called [facility] pharmacy now and confirmed that the Rx is ready for pick up with pt's $4 co-pay.

## 2021-06-10 ENCOUNTER — Ambulatory Visit: Payer: Self-pay | Admitting: Nurse Practitioner

## 2021-06-10 ENCOUNTER — Ambulatory Visit: Payer: Medicaid Other | Admitting: Internal Medicine

## 2021-06-10 ENCOUNTER — Telehealth: Payer: Self-pay | Admitting: Physical Medicine and Rehabilitation

## 2021-06-10 DIAGNOSIS — H5213 Myopia, bilateral: Secondary | ICD-10-CM | POA: Diagnosis not present

## 2021-06-10 NOTE — Telephone Encounter (Signed)
[facility] [phone] called here and said one of you had patient call to order a tens unit??? someone clarify? [id number] is " acct number for patient" - ps they have wrong DOB - I did not confirm or deny as the call was sketchy.Marland KitchenMarland Kitchen

## 2021-06-10 NOTE — Progress Notes (Deleted)
Name: Jill Shaw  Age/ Sex: 45 y.o., female   MRN/ DOB: [id number], [birth year]     PCP: Jill Francois, NP   Reason for Endocrinology Evaluation: Type 2 Diabetes Mellitus  Initial Endocrine Consultative Visit: 02/16/2021    PATIENT IDENTIFIER: [name] is a 45 y.o. female with a past medical history of T2DM, OSA, HTN , HFpEF and sickle cell trait. The patient has followed with Endocrinology clinic since 02/16/2021 for consultative assistance with management of her diabetes.  DIABETIC HISTORY:  Jill Shaw was diagnosed with DM in 2008, she has been on liraglutide in the past. Her hemoglobin A1c has ranged from 7.1% in 2019, peaking at 9.5% in 2021.   On her initial visit to our clinic her A1c was 10.7%, she was on Onglyza and Humalog mix.  We started [place], continued Onglyza, and switch insulin mix to basal/prandial    She was screened for cushingoid features but was noted to have urinary free cortisol was normal at 10.4 MCG  (02/22/2021) SUBJECTIVE:   During the last visit (02/16/2021): A1c 10.7%We started [place], continued Onglyza, and switch insulin mix to basal/prandial  Today (06/10/2021): Jill Shaw is here for follow-up on diabetes management.  She checks her blood sugars *** times daily, preprandial to breakfast and ***. The patient has *** had hypoglycemic episodes since the last clinic visit, which typically occur *** x / - most often occuring ***. The patient is *** symptomatic with these episodes, with symptoms of {symptoms; hypoglycemia:[id number]}.     She was recently seen by her PCP for abdominal pain Follows with pain management    HOME DIABETES REGIMEN:  Farxiga 5 mg daily Onglyza 5 mg daily Lantus 110 units daily Humalog 40 units 3 times daily before every meal  Statin: Yes ACE-I/ARB: Yes Prior Diabetic Education: Yes   METER DOWNLOAD SUMMARY: Date range evaluated: *** Fingerstick Blood Glucose Tests = *** Average Number Tests/Day =  *** Overall Mean FS Glucose = *** Standard Deviation = ***  BG Ranges: Low = *** High = ***   Hypoglycemic Events/30 Days: BG < 50 = *** Episodes of symptomatic severe hypoglycemia = ***    DIABETIC COMPLICATIONS: Microvascular complications:  Neuropathy, left eye retinopathy Denies: CKD Last Eye Exam: Completed 02/2020  Macrovascular complications:   Denies: CAD, CVA, PVD   HISTORY:  Past Medical History:  Past Medical History:  Diagnosis Date   Anemia    Arthritis    knees, hands   Asthma    Chronic diastolic (congestive) heart failure (HCC)    COPD (chronic obstructive pulmonary disease) (HCC)    Diabetes mellitus without complication (HCC)    type 2   Dysfunctional uterine bleeding    GERD (gastroesophageal reflux disease)    Hypertension    Neuromuscular disorder ([place])    neuropathy feet   Seizures ([place]) 09/12/2017   pt states r/t stress and blood sugar - no meds last one 4 months ago, not seen neurologist   Sickle cell trait ([place])    Smoker    Vitamin D deficiency 10/2019   Wears glasses    Past Surgical History:  Past Surgical History:  Procedure Laterality Date   CESAREAN SECTION     x 1. for twins   DILATION AND CURETTAGE OF UTERUS N/A 08/20/2019   Procedure: DILATATION AND CURETTAGE;  Surgeon: Emily Filbert, MD;  Location: [facility];  Service: Gynecology;  Laterality: N/A;   ENDOMETRIAL ABLATION N/A 08/20/2019   Procedure: [place] Ablation;  Surgeon: Emily Filbert, MD;  Location: [facility];  Service: Gynecology;  Laterality: N/A;   EYE SURGERY Bilateral    laser right and cataract removed left eye   RADIOLOGY WITH ANESTHESIA N/A 09/16/2019   Procedure: MRI WITH ANESTHESIA   L SPINE WITHOUT CONTRAST, T SPINE WITHOUT CONTRAST , CERVICAL WITHOUT CONTRAST;  Surgeon: Radiologist, Medication, MD;  Location: [facility];  Service: Radiology;  Laterality: N/A;   TUBAL LIGATION     interval BTL   UPPER GI ENDOSCOPY  07/2017   Social History:  reports that she has been  smoking cigarettes. She has a 6.50 pack-year smoking history. She has never used smokeless tobacco. She reports that she does not drink alcohol and does not use drugs. Family History:  Family History  Problem Relation Age of Onset   Diabetes Mother    Hypertension Mother      HOME MEDICATIONS: Allergies as of 06/10/2021       Reactions   Amitriptyline Other (See Comments)   Coma   Ketoprofen Nausea And Vomiting   Trazodone Nausea And Vomiting   Aspirin Nausea Only   Gabapentin Nausea And Vomiting, Other (See Comments)   upset stomach   Ibuprofen Nausea And Vomiting   Liraglutide Nausea And Vomiting   Naproxen Nausea And Vomiting   Omeprazole-sodium Bicarbonate Nausea And Vomiting   Sulfa Antibiotics Nausea And Vomiting   Tramadol Nausea And Vomiting, Other (See Comments)   stomach upset        Medication List        Accurate as of June 10, 2021 12:54 PM. If you have any questions, ask your nurse or doctor.          Accu-Chek Guide test strip Generic drug: glucose blood USE AS DIRECTED UP TO FOUR TIMES DAILY What changed: See the new instructions.   Accu-Chek Softclix Lancets lancets USE AS DIRECTED UP TO FOUR TIMES DAILY   Adult Blood Pressure Cuff Lg Kit 1 kit by Does not apply route daily.   albuterol (2.5 MG/3ML) 0.083% nebulizer solution Commonly known as: PROVENTIL Take 3 mLs (2.5 mg total) by nebulization every 6 (six) hours as needed for wheezing or shortness of breath.   albuterol 108 (90 Base) MCG/ACT inhaler Commonly known as: VENTOLIN HFA Inhale 2 puffs into the lungs every 6 (six) hours as needed for wheezing or shortness of breath.   blood glucose meter kit and supplies Kit 1 each by Other route See admin instructions. Dispense based on patient and insurance preference. Use up to four times daily as directed. (FOR ICD-9 250.00, 250.01).   budesonide-formoterol 160-4.5 MCG/ACT inhaler Commonly known as: Symbicort INHALE 2 PUFFS INTO THE  LUNGS 2 (TWO) TIMES DAILY. What changed:  how much to take how to take this when to take this additional instructions   cetirizine 10 MG tablet Commonly known as: ZYRTEC Take 1 tablet (10 mg total) by mouth daily.   Dexlansoprazole 30 MG capsule Commonly known as: Dexilant Take 1 capsule (30 mg total) by mouth daily.   empagliflozin 10 MG Tabs tablet Commonly known as: Jardiance Take 1 tablet (10 mg total) by mouth daily before breakfast.   ferrous sulfate 325 (65 FE) MG tablet Take 1 tablet (325 mg total) by mouth 3 (three) times daily with meals.   fluticasone 50 MCG/ACT nasal spray Commonly known as: FLONASE Place 2 sprays into both nostrils daily.   furosemide 40 MG tablet Commonly known as: LASIX TAKE 1 TABLET(40 MG) BY MOUTH TWICE  DAILY What changed: See the new instructions.   Glucosamine Sulfate 1000 MG Caps Take 1 capsule (1,000 mg total) by mouth 2 (two) times daily.   Heating Pad Moist/Dry King Sz Pads 1 each by Does not apply route 4 (four) times daily as needed.   hydrochlorothiazide 12.5 MG capsule Commonly known as: MICROZIDE Take 1 capsule (12.5 mg total) by mouth daily.   HYDROcodone-acetaminophen 10-325 MG tablet Commonly known as: NORCO Take 1 tablet by mouth 2 (two) times daily as needed.   hydrocortisone 2.5 % cream Apply topically 2 (two) times daily. What changed: how much to take   hydroquinone 4 % cream APPLY TOPICALLY TWICE DAILY What changed:  how much to take when to take this reasons to take this   insulin lispro 100 UNIT/ML KwikPen Commonly known as: HumaLOG KwikPen Inject 40 Units into the skin 3 (three) times daily.   Insulin Pen Needle 31G X 8 MM Misc 1 Device by Does not apply route in the morning, at noon, in the evening, and at bedtime.   Lantus SoloStar 100 UNIT/ML Solostar Pen Generic drug: insulin glargine Inject 110 Units into the skin daily.   losartan 50 MG tablet Commonly known as: COZAAR Take 1 tablet (50  mg total) by mouth daily. TAKE 1 TABLET($RemoveBefor'50MG'[id number]$  TOTAL) BY MOUTH EVERY DAY What changed: additional instructions   metoprolol succinate 100 MG 24 hr tablet Commonly known as: TOPROL-XL Take 100 mg by mouth daily. Take with or immediately following a meal.   nicotine 7 mg/24hr patch Commonly known as: Nicoderm CQ Place 1 patch (7 mg total) onto the skin daily. Start only when the 14 mg patches are finished.   norethindrone 5 MG tablet Commonly known as: Aygestin Take 2 tablets (10 mg total) by mouth in the morning, at noon, in the evening, and at bedtime. With bleeding   ondansetron 4 MG disintegrating tablet Commonly known as: Zofran ODT Take 1 tablet (4 mg total) by mouth every 8 (eight) hours as needed for nausea or vomiting.   potassium chloride SA 20 MEQ tablet Commonly known as: KLOR-CON TAKE 1 TABLET(20 MEQ) BY MOUTH DAILY   rosuvastatin 5 MG tablet Commonly known as: Crestor Take 1 tablet (5 mg total) by mouth daily.   saxagliptin HCl 5 MG Tabs tablet Commonly known as: Onglyza Take 1 tablet (5 mg total) by mouth daily.   senna-docusate 8.6-50 MG tablet Commonly known as: Senokot-S Take 1 tablet by mouth at bedtime as needed for mild constipation.   Spiriva Respimat 2.5 MCG/ACT Aers Generic drug: Tiotropium Bromide Monohydrate Inhale 2 puffs into the lungs daily.   spironolactone 50 MG tablet Commonly known as: ALDACTONE TAKE 1 TABLET(50 MG) BY MOUTH DAILY What changed: See the new instructions.   sucralfate 1 GM/10ML suspension Commonly known as: Carafate Take 10 mLs (1 g total) by mouth 4 (four) times daily.   tiZANidine 4 MG tablet Commonly known as: Zanaflex Take 1 tablet (4 mg total) by mouth every 6 (six) hours as needed for muscle spasms.   TobraDex ophthalmic solution Generic drug: tobramycin-dexamethasone Place 1 drop into the right eye 4 (four) times daily.   Vitamin D (Ergocalciferol) 1.25 MG (50000 UNIT) Caps capsule Commonly known as:  DRISDOL Take 1 capsule (50,000 Units total) by mouth every 7 (seven) days. What changed: when to take this   vitamin E 180 MG (400 UNITS) capsule Commonly known as: vitamin E Take 1 capsule (400 Units total) by mouth daily.  OBJECTIVE:   Vital Signs: There were no vitals taken for this visit.  Wt Readings from Last 3 Encounters:  06/06/21 262 lb (118.8 kg)  04/25/21 220 lb (99.8 kg)  04/15/21 261 lb 8 oz (118.6 kg)     Exam: General: Pt appears well and is in NAD  Lungs: Clear with good BS bilat with no rales, rhonchi, or wheezes  Heart: RRR with normal S1 and S2 and no gallops; no murmurs; no rub  Abdomen: Normoactive bowel sounds, soft, nontender, without masses or organomegaly palpable  Extremities: No pretibial edema. No tremor. Normal strength and motion throughout. See detailed diabetic foot exam below.  Neuro: MS is good with appropriate affect, pt is alert and Ox3        DM foot exam: 02/16/2021   The skin of the feet is intact without sores or ulcerations. The pedal pulses are 2+ on right and 2+ on left. The sensation is decreased to a screening 5.07, 10 gram monofilament bilaterally    DATA REVIEWED:  Lab Results  Component Value Date   HGBA1C 10.7 (A) 02/16/2021   HGBA1C 9.7 (H) 12/23/2020   HGBA1C 9.5 (H) 10/16/2020   Lab Results  Component Value Date   LDLCALC 77 06/02/2020   CREATININE 0.90 05/26/2021   No results found for: [facility]   Lab Results  Component Value Date   CHOL 135 06/02/2020   HDL 29 (L) 06/02/2020   LDLCALC 77 06/02/2020   TRIG 280 (H) 11/20/2020   CHOLHDL 4.7 (H) 06/02/2020         ASSESSMENT / PLAN / RECOMMENDATIONS:   1) Type 2 Diabetes Mellitus, ***controlled, With neuropathic and retinopathic complications - Most recent A1c of *** %. Goal A1c <7.0%.    Plan: MEDICATIONS: ***  EDUCATION / INSTRUCTIONS: BG monitoring instructions: Patient is instructed to check her blood sugars *** times a day,  ***. Call [facility] if: BG persistently < 70  I reviewed the Rule of 15 for the treatment of hypoglycemia in detail with the patient. Literature supplied.   2) Diabetic complications:  Eye: Does  have known diabetic retinopathy.  Neuro/ Feet: Does  have known diabetic peripheral neuropathy .  Renal: Patient does not have known baseline CKD. She   is *** on an ACEI/ARB at present. Check urine albumin/creatinine ratio yearly starting at time of diagnosis. If albuminuria is positive, treatment is geared toward better glucose, blood pressure control and use of ACE inhibitors or ARBs. Monitor electrolytes and creatinine once to twice yearly.   3) Lipids: Patient is *** on a statin.  4) Hypertension: *** at goal of < 140/90 mmHg.    F/U in ***    Signed electronically by: Mack Guise, MD  [facility]  [facility] [address] [place], [place] [address] Phone: [phone] FAX: [phone]   CC: Jill Francois, NP [address] [place]  [address] Phone: [phone]  Fax: [phone]  Return to Endocrinology clinic as below: Future Appointments  Date Time Provider [place]  06/10/2021  1:40 PM Kenechukwu Eckstein, Melanie Crazier, MD [facility] None  06/14/2021 10:00 AM Bo Merino I, NP [facility] None  06/15/2021  1:30 PM June Leap L, DO [facility] None  07/01/2021 10:20 AM [facility] NURSE [facility]  07/07/2021 11:00 AM Raulkar, Clide Deutscher, MD [facility] CPR  07/07/2021  2:00 PM Nigel Mormon, MD [facility] None  07/25/2021  2:15 PM Sukanya, Goldblatt [facility]  09/23/2021  11:40 AM Raulkar, Clide Deutscher, MD [facility] CPR

## 2021-06-14 ENCOUNTER — Telehealth: Payer: Self-pay

## 2021-06-14 ENCOUNTER — Encounter: Payer: Self-pay | Admitting: Nurse Practitioner

## 2021-06-14 ENCOUNTER — Other Ambulatory Visit: Payer: Self-pay

## 2021-06-14 ENCOUNTER — Ambulatory Visit (INDEPENDENT_AMBULATORY_CARE_PROVIDER_SITE_OTHER): Payer: Medicaid Other | Admitting: Nurse Practitioner

## 2021-06-14 VITALS — BP 179/77 | HR 90 | Temp 98.4°F | Ht 64.0 in | Wt 268.0 lb

## 2021-06-14 DIAGNOSIS — G4739 Other sleep apnea: Secondary | ICD-10-CM | POA: Diagnosis not present

## 2021-06-14 DIAGNOSIS — E559 Vitamin D deficiency, unspecified: Secondary | ICD-10-CM

## 2021-06-14 DIAGNOSIS — Z794 Long term (current) use of insulin: Secondary | ICD-10-CM | POA: Diagnosis not present

## 2021-06-14 DIAGNOSIS — R296 Repeated falls: Secondary | ICD-10-CM

## 2021-06-14 DIAGNOSIS — E7849 Other hyperlipidemia: Secondary | ICD-10-CM

## 2021-06-14 DIAGNOSIS — E119 Type 2 diabetes mellitus without complications: Secondary | ICD-10-CM | POA: Diagnosis not present

## 2021-06-14 DIAGNOSIS — F17200 Nicotine dependence, unspecified, uncomplicated: Secondary | ICD-10-CM

## 2021-06-14 LAB — POCT GLYCOSYLATED HEMOGLOBIN (HGB A1C)
HbA1c POC (<> result, manual entry): 9.5 % (ref 4.0–5.6)
HbA1c, POC (controlled diabetic range): 9.5 % — AB (ref 0.0–7.0)
HbA1c, POC (prediabetic range): 9.5 % — AB (ref 5.7–6.4)
Hemoglobin A1C: 9.5 % — AB (ref 4.0–5.6)

## 2021-06-14 LAB — POCT URINALYSIS DIP (CLINITEK)
Bilirubin, UA: NEGATIVE
Blood, UA: NEGATIVE
Glucose, UA: 500 mg/dL — AB
Ketones, POC UA: NEGATIVE mg/dL
Leukocytes, UA: NEGATIVE
Nitrite, UA: NEGATIVE
POC PROTEIN,UA: NEGATIVE
Spec Grav, UA: 1.025 (ref 1.010–1.025)
Urobilinogen, UA: 0.2 E.U./dL
pH, UA: 5.5 (ref 5.0–8.0)

## 2021-06-14 MED ORDER — NICOTINE 14 MG/24HR TD PT24
14.0000 mg | MEDICATED_PATCH | Freq: Every day | TRANSDERMAL | 0 refills | Status: AC
Start: 2021-06-14 — End: 2021-07-14

## 2021-06-14 NOTE — Telephone Encounter (Signed)
Attempted to call pt, no answer. Left vm requesting call back.

## 2021-06-14 NOTE — Telephone Encounter (Signed)
Patient called and stated that her bp has been running high. Pts BP today at her PCP was 179/77. I made her a sooner appt 06/21/2021. Pain management wanted her to go to ER due to hypertension. Pt also stated that she is nauseous and throwing up. Please advise.

## 2021-06-14 NOTE — Patient Instructions (Signed)
You were seen today to establish care and follow up on chronic illnesses. Labs were collected, results will be available via MyChart. Please continue taking medications as prescribed. Please maintain follow up with specialist.

## 2021-06-14 NOTE — Progress Notes (Signed)
[place], [place]  [address] Phone:  [phone]   Fax:  [phone] Subjective:   Patient ID: Jill Shaw, female    DOB: [birth year], 45 y.o.   MRN: [id number]  Chief Complaint  Patient presents with   Follow-up    No questions or concerns.    HPI Jill Shaw 45 y.o. female presents with recent increase in falls. States, "my knees just give out on me." Patient has had two falls in the past 2 weeks. Requested that pain management order an MRI, awaiting appointment. Ordered a cane for assistance. Endorses having some numbness and tingling feet, which is currently being managed by pain management. States that she has an upcoming procedure to assist with management of numbness. Denies any current diet and exercise efforts. States that she has had a weight increase of 17 lbs in the past 2 wks, but equates it to fluid retention. Has upcoming appointment with cardiology .  Sleep Apnea: She presents for a sleep evaluation. She complains of tossing and turning.  Symptoms began 1 year ago, unchanged since that time. She sleeps a total of 4 hrs per night. States that she has trouble falling asleep and staying asleep. Suspects its related to her anxiety and depression.  She denies completely or partially paralyzed while falling asleep or waking up. Previous evaluation and treatment has included none. She has been given medications for anxiety and depression in the past with no improvement in symptoms. It was suggested that she may have sleep apnea, but she has never been officially diagnosed or complete sleep study. Requested Ambien prescription.  Patient currently smokes 0.5 PPD and on nicotine patch, states that it is effective, but she is requesting a higher dosage. Currently compliant with all medications and specialist appointments.    Past Medical History:  Diagnosis Date   Anemia    Arthritis    knees, hands   Asthma    Chronic  diastolic (congestive) heart failure (HCC)    COPD (chronic obstructive pulmonary disease) (HCC)    Diabetes mellitus without complication (HCC)    type 2   Dysfunctional uterine bleeding    GERD (gastroesophageal reflux disease)    Hypertension    Neuromuscular disorder ([place])    neuropathy feet   Seizures ([place]) 09/12/2017   pt states r/t stress and blood sugar - no meds last one 4 months ago, not seen neurologist   Sickle cell trait ([place])    Smoker    Vitamin D deficiency 10/2019   Wears glasses     Past Surgical History:  Procedure Laterality Date   CESAREAN SECTION     x 1. for twins   DILATION AND CURETTAGE OF UTERUS N/A 08/20/2019   Procedure: DILATATION AND CURETTAGE;  Surgeon: Emily Filbert, MD;  Location: [facility];  Service: Gynecology;  Laterality: N/A;   ENDOMETRIAL ABLATION N/A 08/20/2019   Procedure: [place] Ablation;  Surgeon: Emily Filbert, MD;  Location: [facility];  Service: Gynecology;  Laterality: N/A;   EYE SURGERY Bilateral    laser right and cataract removed left eye   RADIOLOGY WITH ANESTHESIA N/A 09/16/2019   Procedure: MRI WITH ANESTHESIA   L SPINE WITHOUT CONTRAST, T SPINE WITHOUT CONTRAST , CERVICAL WITHOUT CONTRAST;  Surgeon: Radiologist, Medication, MD;  Location: [facility];  Service: Radiology;  Laterality: N/A;   TUBAL LIGATION     interval BTL   UPPER GI ENDOSCOPY  07/2017  Family History  Problem Relation Age of Onset   Diabetes Mother    Hypertension Mother     Social History   Socioeconomic History   Marital status: Legally Separated    Spouse name: Not on file   Number of children: 3   Years of education: Not on file   Highest education level: Not on file  Occupational History   Occupation: unemployed  Tobacco Use   Smoking status: Some Days    Packs/day: 0.25    Years: 26.00    Pack years: 6.50    Types: Cigarettes    Last attempt to quit: 09/13/2020    Years since quitting: 0.7   Smokeless tobacco: Never   Tobacco comments:    4-5  cigarettes/day  Vaping Use   Vaping Use: Never used  Substance and Sexual Activity   Alcohol use: No   Drug use: No   Sexual activity: Not Currently    Birth control/protection: None  Other Topics Concern   Not on file  Social History Narrative   Right Handed   Lives in a one story apartment, but lives on the second floor   Drinks caffeine once in awhile   Social Determinants of Health   Financial Resource Strain: Not on file  Food Insecurity: No Food Insecurity   Worried About Charity fundraiser in the Last Year: Never true   [place] in the Last Year: Never true  Transportation Needs: No Transportation Needs   Lack of Transportation (Medical): No   Lack of Transportation (Non-Medical): No  Physical Activity: Not on file  Stress: Not on file  Social Connections: Not on file  Intimate Partner Violence: Not on file    Outpatient Medications Prior to Visit  Medication Sig Dispense Refill   ACCU-CHEK GUIDE test strip USE AS DIRECTED UP TO FOUR TIMES DAILY (Patient taking differently: 1 each by Other route in the morning, at noon, in the evening, and at bedtime.) 100 strip 4   Accu-Chek Softclix Lancets lancets USE AS DIRECTED UP TO FOUR TIMES DAILY 100 each 3   albuterol (PROVENTIL) (2.5 MG/3ML) 0.083% nebulizer solution Take 3 mLs (2.5 mg total) by nebulization every 6 (six) hours as needed for wheezing or shortness of breath. 150 mL 6   albuterol (VENTOLIN HFA) 108 (90 Base) MCG/ACT inhaler Inhale 2 puffs into the lungs every 6 (six) hours as needed for wheezing or shortness of breath. 8 g 6   blood glucose meter kit and supplies KIT 1 each by Other route See admin instructions. Dispense based on patient and insurance preference. Use up to four times daily as directed. (FOR ICD-9 250.00, 250.01). 1 each 2   Blood Pressure Monitoring (ADULT BLOOD PRESSURE CUFF LG) KIT 1 kit by Does not apply route daily. 1 kit 0   budesonide-formoterol (SYMBICORT) 160-4.5 MCG/ACT inhaler  INHALE 2 PUFFS INTO THE LUNGS 2 (TWO) TIMES DAILY. (Patient taking differently: Inhale 2 puffs into the lungs 2 (two) times daily.) 10.2 g 6   cetirizine (ZYRTEC) 10 MG tablet Take 1 tablet (10 mg total) by mouth daily. 30 tablet 3   Dexlansoprazole (DEXILANT) 30 MG capsule Take 1 capsule (30 mg total) by mouth daily. 30 capsule 11   empagliflozin (JARDIANCE) 10 MG TABS tablet Take 1 tablet (10 mg total) by mouth daily before breakfast. 30 tablet 6   ferrous sulfate 325 (65 FE) MG tablet Take 1 tablet (325 mg total) by mouth 3 (three) times daily with  meals. 270 tablet 3   fluticasone (FLONASE) 50 MCG/ACT nasal spray Place 2 sprays into both nostrils daily. 16 g 6   furosemide (LASIX) 40 MG tablet TAKE 1 TABLET(40 MG) BY MOUTH TWICE DAILY (Patient taking differently: Take 40 mg by mouth 2 (two) times daily.) 180 tablet 1   Glucosamine Sulfate 1000 MG CAPS Take 1 capsule (1,000 mg total) by mouth 2 (two) times daily. (Patient taking differently: Take 1,000 mg by mouth 2 (two) times daily.) 180 capsule 3   Heating Pads (HEATING PAD MOIST/DRY KING SZ) PADS 1 each by Does not apply route 4 (four) times daily as needed. 120 each 1   HYDROcodone-acetaminophen (NORCO) 10-325 MG tablet Take 1 tablet by mouth 2 (two) times daily as needed. 60 tablet 0   hydrocortisone 2.5 % cream Apply topically 2 (two) times daily. (Patient taking differently: Apply 1 application topically 2 (two) times daily.) 30 g 11   hydroquinone 4 % cream APPLY TOPICALLY TWICE DAILY (Patient taking differently: Apply 1 application topically 2 (two) times daily as needed (pain).) 28.35 g 0   insulin glargine (LANTUS SOLOSTAR) 100 UNIT/ML Solostar Pen Inject 110 Units into the skin daily. 105 mL 6   insulin lispro (HUMALOG KWIKPEN) 100 UNIT/ML KwikPen Inject 40 Units into the skin 3 (three) times daily. 120 mL 3   Insulin Pen Needle 31G X 8 MM MISC 1 Device by Does not apply route in the morning, at noon, in the evening, and at bedtime. 400  each 3   losartan (COZAAR) 50 MG tablet Take 1 tablet (50 mg total) by mouth daily. TAKE 1 TABLET(50MG TOTAL) BY MOUTH EVERY DAY (Patient taking differently: Take 50 mg by mouth daily.) 90 tablet 3   metoprolol succinate (TOPROL-XL) 100 MG 24 hr tablet Take 100 mg by mouth daily. Take with or immediately following a meal.     norethindrone (AYGESTIN) 5 MG tablet Take 2 tablets (10 mg total) by mouth in the morning, at noon, in the evening, and at bedtime. With bleeding 120 tablet 0   ondansetron (ZOFRAN ODT) 4 MG disintegrating tablet Take 1 tablet (4 mg total) by mouth every 8 (eight) hours as needed for nausea or vomiting. 20 tablet 0   potassium chloride SA (KLOR-CON) 20 MEQ tablet TAKE 1 TABLET(20 MEQ) BY MOUTH DAILY 30 tablet 6   rosuvastatin (CRESTOR) 5 MG tablet Take 1 tablet (5 mg total) by mouth daily. 90 tablet 3   saxagliptin HCl (ONGLYZA) 5 MG TABS tablet Take 1 tablet (5 mg total) by mouth daily. 90 tablet 3   senna-docusate (SENOKOT-S) 8.6-50 MG tablet Take 1 tablet by mouth at bedtime as needed for mild constipation. 30 tablet 0   spironolactone (ALDACTONE) 50 MG tablet TAKE 1 TABLET(50 MG) BY MOUTH DAILY (Patient taking differently: Take 50 mg by mouth daily.) 30 tablet 3   sucralfate (CARAFATE) 1 GM/10ML suspension Take 10 mLs (1 g total) by mouth 4 (four) times daily. 3600 mL 3   Tiotropium Bromide Monohydrate (SPIRIVA RESPIMAT) 2.5 MCG/ACT AERS Inhale 2 puffs into the lungs daily. 4 g 6   tiZANidine (ZANAFLEX) 4 MG tablet Take 1 tablet (4 mg total) by mouth every 6 (six) hours as needed for muscle spasms. 120 tablet 3   TOBRADEX ophthalmic solution Place 1 drop into the right eye 4 (four) times daily.     Vitamin D, Ergocalciferol, (DRISDOL) 1.25 MG (50000 UNIT) CAPS capsule Take 1 capsule (50,000 Units total) by mouth every 7 (seven) days. (  Patient taking differently: Take 50,000 Units by mouth every Friday.) 5 capsule 6   vitamin E (VITAMIN E) 180 MG (400 UNITS) capsule Take 1  capsule (400 Units total) by mouth daily. 30 capsule 1   nicotine (NICODERM CQ) 7 mg/24hr patch Place 1 patch (7 mg total) onto the skin daily. Start only when the 14 mg patches are finished. 14 patch 0   hydrochlorothiazide (MICROZIDE) 12.5 MG capsule Take 1 capsule (12.5 mg total) by mouth daily. 30 capsule 3   No facility-administered medications prior to visit.    Allergies  Allergen Reactions   Amitriptyline Other (See Comments)    Coma   Ketoprofen Nausea And Vomiting   Trazodone Nausea And Vomiting   Aspirin Nausea Only   Gabapentin Nausea And Vomiting and Other (See Comments)    upset stomach   Ibuprofen Nausea And Vomiting   Liraglutide Nausea And Vomiting   Naproxen Nausea And Vomiting   Omeprazole-Sodium Bicarbonate Nausea And Vomiting   Sulfa Antibiotics Nausea And Vomiting   Tramadol Nausea And Vomiting and Other (See Comments)    stomach upset    Review of Systems  Constitutional:  Negative for chills, fever and malaise/fatigue.       Weight gain, See HPI  Respiratory:  Negative for cough and shortness of breath.   Cardiovascular:  Positive for leg swelling. Negative for chest pain and palpitations.  Gastrointestinal:  Negative for abdominal pain, blood in stool, constipation, diarrhea, nausea and vomiting.  Musculoskeletal:  Positive for falls.  Skin: Negative.   Neurological: Negative.   Psychiatric/Behavioral:  Positive for depression. The patient is nervous/anxious and has insomnia.   All other systems reviewed and are negative.     Objective:    Physical Exam Vitals reviewed.  Constitutional:      General: She is not in acute distress.    Appearance: Normal appearance. She is obese. She is not ill-appearing or toxic-appearing.  HENT:     Head: Normocephalic.  Cardiovascular:     Rate and Rhythm: Normal rate and regular rhythm.     Pulses: Normal pulses.     Heart sounds: Normal heart sounds.     Comments: Mild swelling to the BLE Pulmonary:      Effort: Pulmonary effort is normal.     Breath sounds: Normal breath sounds.  Musculoskeletal:        General: Normal range of motion.     Comments: No obvious swelling to bilateral knees, non tender to palpation  Skin:    General: Skin is warm and dry.     Capillary Refill: Capillary refill takes less than 2 seconds.  Neurological:     General: No focal deficit present.     Mental Status: She is alert and oriented to person, place, and time.     Motor: No weakness.     Coordination: Coordination normal.     Gait: Gait normal.  Psychiatric:        Mood and Affect: Mood normal.        Behavior: Behavior normal.        Thought Content: Thought content normal.        Judgment: Judgment normal.    BP (!) 179/77 (BP Location: Left Arm, Patient Position: Sitting)   Pulse 90   Temp 98.4 F (36.9 C)   Ht _0  (1.626 m)   Wt 268 lb 0.6 oz (121.6 kg)   SpO2 99%   BMI 46.01 kg/m  Wt Readings from Last  3 Encounters:  06/14/21 268 lb 0.6 oz (121.6 kg)  06/06/21 262 lb (118.8 kg)  04/25/21 220 lb (99.8 kg)    Immunization History  Administered Date(s) Administered   Influenza,inj,Quad PF,6+ Mos 09/03/2018, 07/03/2019, 07/04/2020   Pneumococcal Polysaccharide-23 03/28/2015, 07/04/2020   Tdap 03/27/2016   Unspecified SARS-COV-2 Vaccination 03/25/2020, 05/05/2020    Diabetic Foot Exam - Simple   No data filed     Lab Results  Component Value Date   TSH 0.969 01/10/2021   Lab Results  Component Value Date   WBC 9.6 04/26/2021   HGB 12.6 05/26/2021   HCT 37.0 05/26/2021   MCV 69.6 (L) 04/26/2021   PLT 270 04/26/2021   Lab Results  Component Value Date   NA 140 05/26/2021   K 3.4 (L) 05/26/2021   CO2 24 04/26/2021   GLUCOSE 299 (H) 05/26/2021   BUN 14 05/26/2021   CREATININE 0.90 05/26/2021   BILITOT 0.7 04/26/2021   ALKPHOS 91 04/26/2021   AST 16 04/26/2021   ALT 10 04/26/2021   PROT 6.9 04/26/2021   ALBUMIN 3.7 04/26/2021   CALCIUM 9.2 04/26/2021   ANIONGAP  8 04/26/2021   GFR 57.50 (L) 07/12/2020   Lab Results  Component Value Date   CHOL 135 06/02/2020   CHOL 151 11/05/2019   CHOL 136 03/09/2017   Lab Results  Component Value Date   HDL 29 (L) 06/02/2020   HDL 55 11/05/2019   HDL 55 03/09/2017   Lab Results  Component Value Date   LDLCALC 77 06/02/2020   LDLCALC 63 11/05/2019   LDLCALC 52 03/09/2017   Lab Results  Component Value Date   TRIG 280 (H) 11/20/2020   TRIG 166 (H) 06/02/2020   TRIG 205 (H) 11/05/2019   Lab Results  Component Value Date   CHOLHDL 4.7 (H) 06/02/2020   CHOLHDL 2.7 11/05/2019   CHOLHDL 2.5 03/09/2017   Lab Results  Component Value Date   HGBA1C 9.5 (A) 06/14/2021   HGBA1C 9.5 06/14/2021   HGBA1C 9.5 (A) 06/14/2021   HGBA1C 9.5 (A) 06/14/2021       Assessment & Plan:   Problem List Items Addressed This Visit       Endocrine   Type 2 diabetes mellitus without complication, with long-term current use of insulin ([place]) - Primary   Relevant Orders   HgB A1c (Completed): 9.5, improved since previous    POCT URINALYSIS DIP (CLINITEK) (Completed) Currently managed by endocrinology No changes to medications, patient informed to continue taking as prescribed Encouraged continued diet and exercise efforts  Encouraged continued compliance with medication        Other   Tobacco dependence   Relevant Medications   nicotine (NICODERM CQ - DOSED IN MG/24 HOURS) 14 mg/24hr patch Encouraged smoking cessation, discussed possible complications and goals   Vitamin D deficiency   Relevant Orders   Vitamin D, 25-hydroxy   Other Visit Diagnoses     Other hyperlipidemia       Relevant Orders   Lipid Panel Encouraged continued diet and exercise efforts  Encouraged continued compliance with medication     Sleep apnea-like behavior       Relevant Orders   PSG Sleep Study Discouraged usage of Ambien at this time due to possible Sleep apnea and other chronic illnesses. Recommended sleep study and  possible CPAP trial    Falls frequently              Discussed fall risk reduction at home  Agreed with usage of assistive devices and reevaluation by pain management            Given patient's history and physical, symptoms likely related to worsening             arthralgia v radiculopathy; however, patient denies any musculoskeletal pain            outside of knees.            Will revaluate symptoms and status of screening by specialist at follow up   Follow up in 6 mths for reevaluation of chronic illness or sooner as needed. Patient informed to maintain upcoming appointments with specialist.    I am having [name] start on nicotine. I am also having her maintain her Glucosamine Sulfate, Vitamin D (Ergocalciferol), rosuvastatin, blood glucose meter kit and supplies, hydroquinone, hydrocortisone, albuterol, albuterol, budesonide-formoterol, Spiriva Respimat, Accu-Chek Guide, ferrous sulfate, fluticasone, losartan, saxagliptin HCl, spironolactone, ondansetron, vitamin E, furosemide, hydrochlorothiazide, Heating Pad Moist/Dry King Sz, Insulin Pen Needle, Accu-Chek Softclix Lancets, empagliflozin, insulin lispro, Lantus SoloStar, metoprolol succinate, senna-docusate, tiZANidine, Dexlansoprazole, TobraDex, Adult Blood Pressure Cuff Lg, sucralfate, HYDROcodone-acetaminophen, potassium chloride SA, cetirizine, and norethindrone.  Meds ordered this encounter  Medications   nicotine (NICODERM CQ - DOSED IN MG/24 HOURS) 14 mg/24hr patch    Sig: Place 1 patch (14 mg total) onto the skin daily.    Dispense:  30 patch    Refill:  0      Teena Dunk, NP

## 2021-06-14 NOTE — Telephone Encounter (Signed)
If vomiting, should go to ER. If not, happy to see her on 9/6. Will discuss echocardiogram and stress test results with her then.  Thanks MJP

## 2021-06-15 ENCOUNTER — Telehealth: Payer: Self-pay | Admitting: Physical Medicine and Rehabilitation

## 2021-06-15 ENCOUNTER — Other Ambulatory Visit: Payer: Self-pay | Admitting: Nurse Practitioner

## 2021-06-15 ENCOUNTER — Ambulatory Visit: Payer: Medicaid Other | Admitting: Pulmonary Disease

## 2021-06-15 DIAGNOSIS — E559 Vitamin D deficiency, unspecified: Secondary | ICD-10-CM

## 2021-06-15 LAB — LIPID PANEL
Chol/HDL Ratio: 4.4 ratio (ref 0.0–4.4)
Cholesterol, Total: 158 mg/dL (ref 100–199)
HDL: 36 mg/dL — ABNORMAL LOW (ref >39)
LDL Chol Calc (NIH): 92 mg/dL (ref 0–99)
Triglycerides: 171 mg/dL — ABNORMAL HIGH (ref 0–149)
VLDL Cholesterol Cal: 30 mg/dL (ref 5–40)

## 2021-06-15 LAB — VITAMIN D 25 HYDROXY (VIT D DEFICIENCY, FRACTURES): Vit D, 25-Hydroxy: 12.2 ng/mL — ABNORMAL LOW (ref 30.0–100.0)

## 2021-06-15 MED ORDER — VITAMIN D (ERGOCALCIFEROL) 1.25 MG (50000 UNIT) PO CAPS: 50000.0000 [IU] | ORAL_CAPSULE | ORAL | 0 refills | Status: DC

## 2021-06-15 NOTE — Telephone Encounter (Signed)
Called pt, no answer. Left VM requesting call back.

## 2021-06-15 NOTE — Telephone Encounter (Signed)
Dr. Alanda Amass called [place] back and I have tried to offer her 3 other appts and she declined them.  She is going to just keep her appt on 9/22 with you.

## 2021-06-17 DIAGNOSIS — L299 Pruritus, unspecified: Secondary | ICD-10-CM | POA: Diagnosis not present

## 2021-06-17 DIAGNOSIS — L819 Disorder of pigmentation, unspecified: Secondary | ICD-10-CM | POA: Diagnosis not present

## 2021-06-20 ENCOUNTER — Emergency Department (HOSPITAL_COMMUNITY)
Admission: EM | Admit: 2021-06-20 | Discharge: 2021-06-20 | Disposition: A | Discharge status: Home / Self Care | Payer: Medicaid Other | Attending: Emergency Medicine | Admitting: Emergency Medicine

## 2021-06-20 ENCOUNTER — Emergency Department (HOSPITAL_COMMUNITY): Payer: Medicaid Other

## 2021-06-20 ENCOUNTER — Other Ambulatory Visit: Payer: Self-pay

## 2021-06-20 ENCOUNTER — Encounter (HOSPITAL_COMMUNITY): Payer: Self-pay

## 2021-06-20 DIAGNOSIS — R079 Chest pain, unspecified: Secondary | ICD-10-CM | POA: Diagnosis not present

## 2021-06-20 DIAGNOSIS — I10 Essential (primary) hypertension: Secondary | ICD-10-CM | POA: Diagnosis not present

## 2021-06-20 DIAGNOSIS — Z7951 Long term (current) use of inhaled steroids: Secondary | ICD-10-CM | POA: Insufficient documentation

## 2021-06-20 DIAGNOSIS — J449 Chronic obstructive pulmonary disease, unspecified: Secondary | ICD-10-CM | POA: Diagnosis not present

## 2021-06-20 DIAGNOSIS — I110 Hypertensive heart disease with heart failure: Secondary | ICD-10-CM | POA: Insufficient documentation

## 2021-06-20 DIAGNOSIS — E1110 Type 2 diabetes mellitus with ketoacidosis without coma: Secondary | ICD-10-CM | POA: Insufficient documentation

## 2021-06-20 DIAGNOSIS — Z79899 Other long term (current) drug therapy: Secondary | ICD-10-CM | POA: Insufficient documentation

## 2021-06-20 DIAGNOSIS — R0789 Other chest pain: Secondary | ICD-10-CM | POA: Insufficient documentation

## 2021-06-20 DIAGNOSIS — I517 Cardiomegaly: Secondary | ICD-10-CM | POA: Diagnosis not present

## 2021-06-20 DIAGNOSIS — I5033 Acute on chronic diastolic (congestive) heart failure: Secondary | ICD-10-CM | POA: Insufficient documentation

## 2021-06-20 DIAGNOSIS — F17210 Nicotine dependence, cigarettes, uncomplicated: Secondary | ICD-10-CM | POA: Insufficient documentation

## 2021-06-20 DIAGNOSIS — Z7984 Long term (current) use of oral hypoglycemic drugs: Secondary | ICD-10-CM | POA: Insufficient documentation

## 2021-06-20 DIAGNOSIS — E1143 Type 2 diabetes mellitus with diabetic autonomic (poly)neuropathy: Secondary | ICD-10-CM | POA: Diagnosis not present

## 2021-06-20 DIAGNOSIS — K219 Gastro-esophageal reflux disease without esophagitis: Secondary | ICD-10-CM | POA: Diagnosis not present

## 2021-06-20 DIAGNOSIS — Z794 Long term (current) use of insulin: Secondary | ICD-10-CM | POA: Diagnosis not present

## 2021-06-20 DIAGNOSIS — N898 Other specified noninflammatory disorders of vagina: Secondary | ICD-10-CM | POA: Diagnosis not present

## 2021-06-20 DIAGNOSIS — R109 Unspecified abdominal pain: Secondary | ICD-10-CM | POA: Diagnosis not present

## 2021-06-20 DIAGNOSIS — E1129 Type 2 diabetes mellitus with other diabetic kidney complication: Secondary | ICD-10-CM | POA: Diagnosis not present

## 2021-06-20 DIAGNOSIS — J45909 Unspecified asthma, uncomplicated: Secondary | ICD-10-CM | POA: Insufficient documentation

## 2021-06-20 DIAGNOSIS — I201 Angina pectoris with documented spasm: Secondary | ICD-10-CM | POA: Diagnosis not present

## 2021-06-20 DIAGNOSIS — R112 Nausea with vomiting, unspecified: Secondary | ICD-10-CM | POA: Insufficient documentation

## 2021-06-20 DIAGNOSIS — I959 Hypotension, unspecified: Secondary | ICD-10-CM | POA: Diagnosis not present

## 2021-06-20 DIAGNOSIS — R000 Tachycardia, unspecified: Secondary | ICD-10-CM | POA: Diagnosis not present

## 2021-06-20 DIAGNOSIS — I200 Unstable angina: Secondary | ICD-10-CM | POA: Diagnosis not present

## 2021-06-20 LAB — I-STAT VENOUS BLOOD GAS, ED
Acid-base deficit: 2 mmol/L (ref 0.0–2.0)
Bicarbonate: 22.3 mmol/L (ref 20.0–28.0)
Calcium, Ion: 1.18 mmol/L (ref 1.15–1.40)
HCT: 34 % — ABNORMAL LOW (ref 36.0–46.0)
Hemoglobin: 11.6 g/dL — ABNORMAL LOW (ref 12.0–15.0)
O2 Saturation: 95 %
Potassium: 4 mmol/L (ref 3.5–5.1)
Sodium: 140 mmol/L (ref 135–145)
TCO2: 23 mmol/L (ref 22–32)
pCO2, Ven: 33.5 mmHg — ABNORMAL LOW (ref 44.0–60.0)
pH, Ven: 7.43 (ref 7.250–7.430)
pO2, Ven: 72 mmHg — ABNORMAL HIGH (ref 32.0–45.0)

## 2021-06-20 LAB — COMPREHENSIVE METABOLIC PANEL
ALT: 18 U/L (ref 0–44)
AST: 46 U/L — ABNORMAL HIGH (ref 15–41)
Albumin: 3.5 g/dL (ref 3.5–5.0)
Alkaline Phosphatase: 91 U/L (ref 38–126)
Anion gap: 9 (ref 5–15)
BUN: 18 mg/dL (ref 6–20)
CO2: 20 mmol/L — ABNORMAL LOW (ref 22–32)
Calcium: 9.1 mg/dL (ref 8.9–10.3)
Chloride: 106 mmol/L (ref 98–111)
Creatinine, Ser: 1.08 mg/dL — ABNORMAL HIGH (ref 0.44–1.00)
GFR, Estimated: >60 mL/min (ref >60)
Glucose, Bld: 210 mg/dL — ABNORMAL HIGH (ref 70–99)
Potassium: 4.7 mmol/L (ref 3.5–5.1)
Sodium: 135 mmol/L (ref 135–145)
Total Bilirubin: 1 mg/dL (ref 0.3–1.2)
Total Protein: 6.6 g/dL (ref 6.5–8.1)

## 2021-06-20 LAB — CBC WITH DIFFERENTIAL/PLATELET
Abs Immature Granulocytes: 0.02 10*3/uL (ref 0.00–0.07)
Basophils Absolute: 0.1 10*3/uL (ref 0.0–0.1)
Basophils Relative: 1 %
Eosinophils Absolute: 0.3 10*3/uL (ref 0.0–0.5)
Eosinophils Relative: 4 %
HCT: 35.2 % — ABNORMAL LOW (ref 36.0–46.0)
Hemoglobin: 10.7 g/dL — ABNORMAL LOW (ref 12.0–15.0)
Immature Granulocytes: 0 %
Lymphocytes Relative: 31 %
Lymphs Abs: 2.4 10*3/uL (ref 0.7–4.0)
MCH: 21.7 pg — ABNORMAL LOW (ref 26.0–34.0)
MCHC: 30.4 g/dL (ref 30.0–36.0)
MCV: 71.5 fL — ABNORMAL LOW (ref 80.0–100.0)
Monocytes Absolute: 0.4 10*3/uL (ref 0.1–1.0)
Monocytes Relative: 5 %
Neutro Abs: 4.7 10*3/uL (ref 1.7–7.7)
Neutrophils Relative %: 59 %
Platelets: UNDETERMINED 10*3/uL (ref 150–400)
RBC: 4.92 MIL/uL (ref 3.87–5.11)
RDW: 22.7 % — ABNORMAL HIGH (ref 11.5–15.5)
WBC: 7.8 10*3/uL (ref 4.0–10.5)
nRBC: 0 % (ref 0.0–0.2)

## 2021-06-20 LAB — URINALYSIS, ROUTINE W REFLEX MICROSCOPIC
Bilirubin Urine: NEGATIVE
Glucose, UA: NEGATIVE mg/dL
Hgb urine dipstick: NEGATIVE
Ketones, ur: NEGATIVE mg/dL
Leukocytes,Ua: NEGATIVE
Nitrite: NEGATIVE
Protein, ur: NEGATIVE mg/dL
Specific Gravity, Urine: 1.021 (ref 1.005–1.030)
pH: 5 (ref 5.0–8.0)

## 2021-06-20 LAB — CBG MONITORING, ED
Glucose-Capillary: 223 mg/dL — ABNORMAL HIGH (ref 70–99)
Glucose-Capillary: 194 mg/dL — ABNORMAL HIGH (ref 70–99)

## 2021-06-20 LAB — TROPONIN I (HIGH SENSITIVITY)
Troponin I (High Sensitivity): 8 ng/L (ref <18)
Troponin I (High Sensitivity): 6 ng/L (ref <18)

## 2021-06-20 MED ORDER — FENTANYL CITRATE PF 50 MCG/ML IJ SOSY
50.0000 ug | PREFILLED_SYRINGE | Freq: Once | INTRAMUSCULAR | Status: AC
  Administered 2021-06-20: 50 ug via INTRAVENOUS
  Filled 2021-06-20: qty 1

## 2021-06-20 MED ORDER — ONDANSETRON 4 MG PO TBDP
4.0000 mg | ORAL_TABLET | Freq: Once | ORAL | Status: AC
  Administered 2021-06-20: 4 mg via ORAL
  Filled 2021-06-20: qty 1

## 2021-06-20 MED ORDER — ONDANSETRON HCL 4 MG/2ML IJ SOLN
4.0000 mg | Freq: Once | INTRAMUSCULAR | Status: AC
  Administered 2021-06-20: 4 mg via INTRAVENOUS
  Filled 2021-06-20: qty 2

## 2021-06-20 MED ORDER — ONDANSETRON 4 MG PO TBDP: 4.0000 mg | ORAL_TABLET | Freq: Three times a day (TID) | ORAL | 0 refills | Status: DC | PRN

## 2021-06-20 MED ORDER — HYDROCODONE-ACETAMINOPHEN 5-325 MG PO TABS
2.0000 | ORAL_TABLET | Freq: Once | ORAL | Status: AC
  Administered 2021-06-20: 2 via ORAL
  Filled 2021-06-20: qty 2

## 2021-06-20 MED ORDER — ONDANSETRON HCL 4 MG/2ML IJ SOLN: 4.0000 mg | Freq: Once | INTRAMUSCULAR | Status: DC

## 2021-06-20 NOTE — ED Notes (Signed)
Pt ambulated to the bathroom on her own power, gait even and steady 

## 2021-06-20 NOTE — Discharge Instructions (Addendum)
Follow-up with cardiology tomorrow as planned.  Talk to your pain doctor if you feel you need adjustment of your medications.

## 2021-06-20 NOTE — ED Triage Notes (Addendum)
Pt comes from home via EMS, Pt reports she has had pain all over and intermittent cp for several weeks. Pt is under Dr care for HTN and it remains uncontrolled. Pt refused hospitalization in early august. Pt has appointment with PCP tomorrow for HTN followup. Pt became nauseated this morning and called EMS. 20g IV established in RAC. EKG unremarkable, 1 sublingual nitro given with encouragement. no change in cp. Pt refused aspirin. First responders were not able to obtain CBG due to pt unwilling to cooperate. Pt a/o x4. BP 130/60 last one (204/92 initial) HR 104 RR 22 O2 96% RA

## 2021-06-20 NOTE — ED Notes (Signed)
Pt yelling out stating that she "is still in so much pain!"

## 2021-06-20 NOTE — ED Notes (Signed)
IV attempt unsuccessful in LAC, IV established by EMS not patent and removed at this time.

## 2021-06-20 NOTE — ED Notes (Signed)
Pt requesting nausea and pain medication 

## 2021-06-20 NOTE — ED Notes (Signed)
Pt requested for her blood sugar to be checked due to feeling week when walking to restroom.

## 2021-06-20 NOTE — ED Provider Notes (Signed)
[facility] EMERGENCY DEPARTMENT Provider Note   CSN: [id number] Arrival date & time: 06/20/21  0745     History Chief Complaint  Patient presents with   Chest Pain    Jill Shaw is a 45 y.o. female.   Chest Pain Associated symptoms: nausea and vomiting   Associated symptoms: no back pain and no weakness   Patient called EMS for chest pain.  Reportedly has had chest pain over the last few weeks.  Worse last day or 2.  Anterior chest.  States she woke up and the pain was severe.  Has had recent echocardiogram and stress test both of which were reassuring but is supposed to see cardiology tomorrow.  Blood pressure was elevated for EMS.  No change in pain with nitroglycerin.  History of hypertension has been poorly controlled.  Patient refused aspirin by EMS.  Also refused CBG.  States her sugar was 400 this morning.  States it has been high.  States she has abdominal pain and vomiting.  Does have history of DKA.    Past Medical History:  Diagnosis Date   Anemia    Arthritis    knees, hands   Asthma    Chronic diastolic (congestive) heart failure (HCC)    COPD (chronic obstructive pulmonary disease) (HCC)    Diabetes mellitus without complication ([place])    type 2   Dysfunctional uterine bleeding    GERD (gastroesophageal reflux disease)    Hypertension    Neuromuscular disorder (HCC)    neuropathy feet   Seizures ([place]) 09/12/2017   pt states r/t stress and blood sugar - no meds last one 4 months ago, not seen neurologist   Sickle cell trait ([place])    Smoker    Vitamin D deficiency 10/2019   Wears glasses     Patient Active Problem List   Diagnosis Date Noted   DM (diabetes mellitus), type 2, uncontrolled, with renal complications ([place]) 04/10/9484   Intractable nausea and vomiting 03/23/2021   Serotonin syndrome 03/14/2021   Overdose, undetermined intent, initial encounter 03/14/2021   Dyslipidemia 02/16/2021   Insulin resistance 02/16/2021   Type  2 diabetes mellitus without complication, with long-term current use of insulin ([place]) 02/16/2021   Amitriptyline overdose, accidental or unintentional, initial encounter 01/09/2021   Ingestion of substance, undetermined intent, initial encounter 01/08/2021   Hyperglycemia due to type 2 diabetes mellitus ([place]) 01/08/2021   SIRS (systemic inflammatory response syndrome) ([place]) 01/08/2021   Exertional chest pain 12/31/2020   SOB (shortness of breath) 12/24/2020   DKA (diabetic ketoacidosis) ([place]) 10/15/2020   Hyperkalemia 10/15/2020   Severe sepsis ([place]) 46/27/0350   Acute metabolic encephalopathy 09/38/1829   Sinus tachycardia 10/14/2020   Precordial pain 10/14/2020   Abnormal findings on diagnostic imaging of lung 08/19/2020   Physical deconditioning 08/19/2020   History of endometrial ablation 07/27/2020   History of alcohol use 07/20/2020   Current moderate episode of major depressive disorder without prior episode ([place]) 07/20/2020   Sickle cell trait ([place]) 07/19/2020   Elevated d-dimer 07/13/2020   Healthcare maintenance 07/12/2020   Exertional dyspnea 07/12/2020   Atypical chest pain 07/12/2020   At risk for obstructive sleep apnea 07/12/2020   DKA (diabetic ketoacidoses) 07/02/2020   Acute on chronic diastolic (congestive) heart failure ([place]) 06/22/2020   Generalized abdominal pain 03/08/2020   Chronic diastolic CHF (congestive heart failure) ([place]) 03/08/2020   Chronic anemia 12/19/2019   Elevated sed rate 11/26/2019   Elevated C-reactive protein (CRP) 11/26/2019  Hypoglycemia 02/07/2019   Hypotension 02/07/2019   Lactic acidosis 02/07/2019   Right ankle pain 02/07/2019   Chronic obstructive pulmonary disease ([place]) 02/05/2019   Vitamin D deficiency 05/31/2017   Gastroesophageal reflux disease 05/24/2017   Abnormal uterine bleeding (AUB) 05/17/2017   Anemia of chronic disease 04/06/2017   Knee pain, chronic 03/27/2016   Essential hypertension 03/27/2016   Obesity, Class  III, BMI 40-49.9 (morbid obesity) ([place]) 03/27/2016   Irritable bowel syndrome with constipation 03/27/2016   Tobacco dependence 03/27/2016   Arthropathy, lower leg 05/04/2013   CTS (carpal tunnel syndrome) 05/04/2013   Peripheral edema 05/04/2013   Chronic pain 05/13/2012   Diabetic gastroparesis ([place]) 11/06/2006   Hot flashes 11/06/2006    Past Surgical History:  Procedure Laterality Date   CESAREAN SECTION     x 1. for twins   DILATION AND CURETTAGE OF UTERUS N/A 08/20/2019   Procedure: DILATATION AND CURETTAGE;  Surgeon: Emily Filbert, MD;  Location: [facility];  Service: Gynecology;  Laterality: N/A;   ENDOMETRIAL ABLATION N/A 08/20/2019   Procedure: [place] Ablation;  Surgeon: Emily Filbert, MD;  Location: [facility];  Service: Gynecology;  Laterality: N/A;   EYE SURGERY Bilateral    laser right and cataract removed left eye   RADIOLOGY WITH ANESTHESIA N/A 09/16/2019   Procedure: MRI WITH ANESTHESIA   L SPINE WITHOUT CONTRAST, T SPINE WITHOUT CONTRAST , CERVICAL WITHOUT CONTRAST;  Surgeon: Radiologist, Medication, MD;  Location: [place];  Service: Radiology;  Laterality: N/A;   TUBAL LIGATION     interval BTL   UPPER GI ENDOSCOPY  07/2017     OB History     Gravida  3   Para      Term      Preterm      AB      Living  3      SAB      IAB      Ectopic      Multiple      Live Births  3        Obstetric Comments  Vaginal x 1 and then c-section for twins         Family History  Problem Relation Age of Onset   Diabetes Mother    Hypertension Mother     Social History   Tobacco Use   Smoking status: Some Days    Packs/day: 0.25    Years: 26.00    Pack years: 6.50    Types: Cigarettes    Last attempt to quit: 09/13/2020    Years since quitting: 0.7   Smokeless tobacco: Never   Tobacco comments:    4-5 cigarettes/day  Vaping Use   Vaping Use: Never used  Substance Use Topics   Alcohol use: No   Drug use: No    Home Medications Prior to Admission  medications   Medication Sig Start Date End Date Taking? Authorizing Provider  ACCU-CHEK GUIDE test strip USE AS DIRECTED UP TO FOUR TIMES DAILY Patient taking differently: 1 each by Other route in the morning, at noon, in the evening, and at bedtime. 11/11/20   Vevelyn Francois, NP  Accu-Chek Softclix Lancets lancets USE AS DIRECTED UP TO FOUR TIMES DAILY 02/17/21   Vevelyn Francois, NP  albuterol (PROVENTIL) (2.5 MG/3ML) 0.083% nebulizer solution Take 3 mLs (2.5 mg total) by nebulization every 6 (six) hours as needed for wheezing or shortness of breath. 09/27/20   June Leap L, DO  albuterol (VENTOLIN  HFA) 108 (90 Base) MCG/ACT inhaler Inhale 2 puffs into the lungs every 6 (six) hours as needed for wheezing or shortness of breath. 09/27/20   June Leap L, DO  blood glucose meter kit and supplies KIT 1 each by Other route See admin instructions. Dispense based on patient and insurance preference. Use up to four times daily as directed. (FOR ICD-9 250.00, 250.01). 09/02/20   Vevelyn Francois, NP  Blood Pressure Monitoring (ADULT BLOOD PRESSURE CUFF LG) KIT 1 kit by Does not apply route daily. 05/05/21   Vevelyn Francois, NP  budesonide-formoterol (SYMBICORT) 160-4.5 MCG/ACT inhaler INHALE 2 PUFFS INTO THE LUNGS 2 (TWO) TIMES DAILY. Patient taking differently: Inhale 2 puffs into the lungs 2 (two) times daily. 09/27/20   Icard, Octavio Graves, DO  cetirizine (ZYRTEC) 10 MG tablet Take 1 tablet (10 mg total) by mouth daily. 06/07/21   Vevelyn Francois, NP  Dexlansoprazole (DEXILANT) 30 MG capsule Take 1 capsule (30 mg total) by mouth daily. 04/13/21 04/13/22  Vevelyn Francois, NP  empagliflozin (JARDIANCE) 10 MG TABS tablet Take 1 tablet (10 mg total) by mouth daily before breakfast. 02/21/21   Shamleffer, Melanie Crazier, MD  ferrous sulfate 325 (65 FE) MG tablet Take 1 tablet (325 mg total) by mouth 3 (three) times daily with meals. 12/02/20 12/02/21  Vevelyn Francois, NP  fluticasone (FLONASE) 50 MCG/ACT nasal  spray Place 2 sprays into both nostrils daily. 12/02/20   Vevelyn Francois, NP  furosemide (LASIX) 40 MG tablet TAKE 1 TABLET(40 MG) BY MOUTH TWICE DAILY Patient taking differently: Take 40 mg by mouth 2 (two) times daily. 02/08/21   Lorretta Harp, MD  Glucosamine Sulfate 1000 MG CAPS Take 1 capsule (1,000 mg total) by mouth 2 (two) times daily. Patient taking differently: Take 1,000 mg by mouth 2 (two) times daily. 08/22/19   Hilts, Legrand Como, MD  Heating Pads (HEATING PAD MOIST/DRY KING SZ) PADS 1 each by Does not apply route 4 (four) times daily as needed. 02/14/21   Raulkar, Clide Deutscher, MD  hydrochlorothiazide (MICROZIDE) 12.5 MG capsule Take 1 capsule (12.5 mg total) by mouth daily. 02/11/21 06/11/21  Cantwell, Celeste C, PA-C  HYDROcodone-acetaminophen (NORCO) 10-325 MG tablet Take 1 tablet by mouth 2 (two) times daily as needed. 06/06/21   Bayard Hugger, NP  hydrocortisone 2.5 % cream Apply topically 2 (two) times daily. Patient taking differently: Apply 1 application topically 2 (two) times daily. 09/22/20   Vevelyn Francois, NP  hydroquinone 4 % cream APPLY TOPICALLY TWICE DAILY Patient taking differently: Apply 1 application topically 2 (two) times daily as needed (pain). 09/13/20   Vevelyn Francois, NP  insulin glargine (LANTUS SOLOSTAR) 100 UNIT/ML Solostar Pen Inject 110 Units into the skin daily. 03/11/21   Shamleffer, Melanie Crazier, MD  insulin lispro (HUMALOG KWIKPEN) 100 UNIT/ML KwikPen Inject 40 Units into the skin 3 (three) times daily. 03/02/21   Shamleffer, Melanie Crazier, MD  Insulin Pen Needle 31G X 8 MM MISC 1 Device by Does not apply route in the morning, at noon, in the evening, and at bedtime. 02/16/21   Shamleffer, Melanie Crazier, MD  losartan (COZAAR) 50 MG tablet Take 1 tablet (50 mg total) by mouth daily. TAKE 1 TABLET(50MG TOTAL) BY MOUTH EVERY DAY Patient taking differently: Take 50 mg by mouth daily. 12/02/20 12/02/21  Vevelyn Francois, NP  metoprolol succinate (TOPROL-XL) 100  MG 24 hr tablet Take 100 mg by mouth daily. Take with or immediately following a  meal.    [provider]  nicotine (NICODERM CQ - DOSED IN MG/24 HOURS) 14 mg/24hr patch Place 1 patch (14 mg total) onto the skin daily. 06/14/21 07/14/21  Bo Merino I, NP  norethindrone (AYGESTIN) 5 MG tablet Take 2 tablets (10 mg total) by mouth in the morning, at noon, in the evening, and at bedtime. With bleeding 06/08/21   Aletha Halim, MD  ondansetron (ZOFRAN ODT) 4 MG disintegrating tablet Take 1 tablet (4 mg total) by mouth every 8 (eight) hours as needed for nausea or vomiting. 06/20/21   Davonna Belling, MD  potassium chloride SA (KLOR-CON) 20 MEQ tablet TAKE 1 TABLET(20 MEQ) BY MOUTH DAILY 06/06/21   Lorretta Harp, MD  rosuvastatin (CRESTOR) 5 MG tablet Take 1 tablet (5 mg total) by mouth daily. 08/11/20   Lorretta Harp, MD  saxagliptin HCl (ONGLYZA) 5 MG TABS tablet Take 1 tablet (5 mg total) by mouth daily. 12/02/20 12/02/21  Vevelyn Francois, NP  senna-docusate (SENOKOT-S) 8.6-50 MG tablet Take 1 tablet by mouth at bedtime as needed for mild constipation. 03/24/21   Debbe Odea, MD  spironolactone (ALDACTONE) 50 MG tablet TAKE 1 TABLET(50 MG) BY MOUTH DAILY Patient taking differently: Take 50 mg by mouth daily. 12/22/20   Patwardhan, Reynold Bowen, MD  sucralfate (CARAFATE) 1 GM/10ML suspension Take 10 mLs (1 g total) by mouth 4 (four) times daily. 05/18/21 05/18/22  Vevelyn Francois, NP  Tiotropium Bromide Monohydrate (SPIRIVA RESPIMAT) 2.5 MCG/ACT AERS Inhale 2 puffs into the lungs daily. 09/27/20   Icard, Octavio Graves, DO  tiZANidine (ZANAFLEX) 4 MG tablet Take 1 tablet (4 mg total) by mouth every 6 (six) hours as needed for muscle spasms. 04/04/21   Raulkar, Clide Deutscher, MD  TOBRADEX ophthalmic solution Place 1 drop into the right eye 4 (four) times daily. 04/30/21   [provider]  Vitamin D, Ergocalciferol, (DRISDOL) 1.25 MG (50000 UNIT) CAPS capsule Take 1 capsule (50,000 Units total) by  mouth every 7 (seven) days for 8 doses. 06/15/21 08/04/21  Bo Merino I, NP  vitamin E (VITAMIN E) 180 MG (400 UNITS) capsule Take 1 capsule (400 Units total) by mouth daily. 01/07/21   Izora Ribas, MD    Allergies    Amitriptyline, Ketoprofen, Trazodone, Aspirin, Gabapentin, Ibuprofen, Liraglutide, Naproxen, Omeprazole-sodium bicarbonate, Sulfa antibiotics, and Tramadol  Review of Systems   Review of Systems  Constitutional:  Positive for appetite change.  HENT:  Negative for congestion.   Cardiovascular:  Positive for chest pain.  Gastrointestinal:  Positive for nausea and vomiting. Negative for diarrhea.  Genitourinary:  Negative for flank pain.  Musculoskeletal:  Positive for myalgias. Negative for back pain.  Skin:  Negative for rash.  Neurological:  Negative for weakness.  Psychiatric/Behavioral:  Negative for confusion.    Physical Exam Updated Vital Signs BP (!) 155/89 (BP Location: Right Arm)   Pulse 90   Temp 98.9 F (37.2 C) (Oral)   Resp 16   SpO2 99%   Physical Exam Vitals and nursing note reviewed.  Constitutional:      Appearance: She is obese.  HENT:     Head: Normocephalic.  Eyes:     Pupils: Pupils are equal, round, and reactive to light.  Cardiovascular:     Rate and Rhythm: Regular rhythm. Tachycardia present.  Pulmonary:     Breath sounds: No wheezing, rhonchi or rales.  Abdominal:     Tenderness: There is abdominal tenderness.     Comments: Mild diffuse  abdominal tenderness without rebound or guarding.  No hernia palpated.  Musculoskeletal:     Cervical back: Neck supple.     Right lower leg: No tenderness.     Left lower leg: No tenderness.  Skin:    General: Skin is warm.     Capillary Refill: Capillary refill takes less than 2 seconds.  Neurological:     Mental Status: She is alert and oriented to person, place, and time.    ED Results / Procedures / Treatments   Labs (all labs ordered are listed, but only abnormal results  are displayed) Labs Reviewed  COMPREHENSIVE METABOLIC PANEL - Abnormal; Notable for the following components:      Result Value   CO2 20 (*)    Glucose, Bld 210 (*)    Creatinine, Ser 1.08 (*)    AST 46 (*)    All other components within normal limits  CBC WITH DIFFERENTIAL/PLATELET - Abnormal; Notable for the following components:   Hemoglobin 10.7 (*)    HCT 35.2 (*)    MCV 71.5 (*)    MCH 21.7 (*)    RDW 22.7 (*)    All other components within normal limits  CBG MONITORING, ED - Abnormal; Notable for the following components:   Glucose-Capillary 223 (*)    All other components within normal limits  I-STAT VENOUS BLOOD GAS, ED - Abnormal; Notable for the following components:   pCO2, Ven 33.5 (*)    pO2, Ven 72.0 (*)    HCT 34.0 (*)    Hemoglobin 11.6 (*)    All other components within normal limits  CBG MONITORING, ED - Abnormal; Notable for the following components:   Glucose-Capillary 194 (*)    All other components within normal limits  URINALYSIS, ROUTINE W REFLEX MICROSCOPIC  TROPONIN I (HIGH SENSITIVITY)  TROPONIN I (HIGH SENSITIVITY)    EKG EKG Interpretation  Date/Time:  Monday June 20 2021 07:52:23 EDT Ventricular Rate:  102 PR Interval:  148 QRS Duration: 87 QT Interval:  337 QTC Calculation: 439 R Axis:   60 Text Interpretation: Sinus tachycardia Low voltage, precordial leads Probable anteroseptal infarct, old No significant change since last tracing Confirmed by Davonna Belling [phone]) on 06/20/2021 7:59:06 AM  Radiology DG Chest Portable 1 View  Result Date: 06/20/2021 CLINICAL DATA:  Severe mid chest pain for 2 days. EXAM: PORTABLE CHEST 1 VIEW COMPARISON:  One-view chest x-ray 04/26/2021 FINDINGS: Heart is enlarged. Lung volumes are low. Mild bibasilar airspace disease likely reflects atelectasis, not significantly changed. IMPRESSION: Stable cardiomegaly and bibasilar airspace disease, likely atelectasis. Electronically Signed   By: San Morelle M.D.   On: 06/20/2021 08:36    Procedures Procedures   Medications Ordered in ED Medications  ondansetron (ZOFRAN-ODT) disintegrating tablet 4 mg (4 mg Oral Given 06/20/21 0833)  HYDROcodone-acetaminophen (NORCO/VICODIN) 5-325 MG per tablet 2 tablet (2 tablets Oral Given 06/20/21 0832)  fentaNYL (SUBLIMAZE) injection 50 mcg (50 mcg Intravenous Given 06/20/21 1231)  ondansetron (ZOFRAN) injection 4 mg (4 mg Intravenous Given 06/20/21 1229)    ED Course  I have reviewed the triage vital signs and the nursing notes.  Pertinent labs & imaging results that were available during my care of the patient were reviewed by me and considered in my medical decision making (see chart for details).    MDM Rules/Calculators/A&P                           Patient  with abdominal pain nausea vomiting.  Mild hyperglycemia.  Not in DKA.  Required 2 doses of Zofran and some fluids.  Small dose of fentanyl also given.  Does have chronic abdominal pain.  Also was having chest pain.  EKG reassuring.  Recent low risk echo and low risk stress test.  Follow-up with cardiology tomorrow with pre-existing appointment.  Discharge home. Final Clinical Impression(s) / ED Diagnoses Final diagnoses:  Nonspecific chest pain  Nausea and vomiting, intractability of vomiting not specified, unspecified vomiting type    Rx / DC Orders ED Discharge Orders          Ordered    ondansetron (ZOFRAN ODT) 4 MG disintegrating tablet  Every 8 hours PRN        06/20/21 1409             Davonna Belling, MD 06/21/21 [phone]

## 2021-06-21 ENCOUNTER — Ambulatory Visit: Payer: Medicaid Other | Admitting: Cardiology

## 2021-06-21 ENCOUNTER — Other Ambulatory Visit: Payer: Self-pay | Admitting: Obstetrics and Gynecology

## 2021-06-21 DIAGNOSIS — G8929 Other chronic pain: Secondary | ICD-10-CM | POA: Diagnosis not present

## 2021-06-21 DIAGNOSIS — G5603 Carpal tunnel syndrome, bilateral upper limbs: Secondary | ICD-10-CM | POA: Diagnosis not present

## 2021-06-21 DIAGNOSIS — M1711 Unilateral primary osteoarthritis, right knee: Secondary | ICD-10-CM | POA: Diagnosis not present

## 2021-06-21 DIAGNOSIS — N939 Abnormal uterine and vaginal bleeding, unspecified: Secondary | ICD-10-CM

## 2021-06-21 DIAGNOSIS — Z7409 Other reduced mobility: Secondary | ICD-10-CM | POA: Diagnosis not present

## 2021-06-21 DIAGNOSIS — I10 Essential (primary) hypertension: Secondary | ICD-10-CM | POA: Diagnosis not present

## 2021-06-22 ENCOUNTER — Emergency Department (HOSPITAL_COMMUNITY)
Admission: EM | Admit: 2021-06-22 | Discharge: 2021-06-22 | Disposition: A | Discharge status: Home / Self Care | Payer: Medicaid Other | Attending: Emergency Medicine | Admitting: Emergency Medicine

## 2021-06-22 ENCOUNTER — Other Ambulatory Visit: Payer: Self-pay

## 2021-06-22 ENCOUNTER — Emergency Department (HOSPITAL_COMMUNITY): Payer: Medicaid Other

## 2021-06-22 ENCOUNTER — Ambulatory Visit: Payer: Medicaid Other | Admitting: Student

## 2021-06-22 DIAGNOSIS — Z79899 Other long term (current) drug therapy: Secondary | ICD-10-CM | POA: Insufficient documentation

## 2021-06-22 DIAGNOSIS — I5033 Acute on chronic diastolic (congestive) heart failure: Secondary | ICD-10-CM | POA: Insufficient documentation

## 2021-06-22 DIAGNOSIS — K219 Gastro-esophageal reflux disease without esophagitis: Secondary | ICD-10-CM | POA: Insufficient documentation

## 2021-06-22 DIAGNOSIS — R7309 Other abnormal glucose: Secondary | ICD-10-CM | POA: Diagnosis not present

## 2021-06-22 DIAGNOSIS — R109 Unspecified abdominal pain: Secondary | ICD-10-CM

## 2021-06-22 DIAGNOSIS — Z7952 Long term (current) use of systemic steroids: Secondary | ICD-10-CM | POA: Insufficient documentation

## 2021-06-22 DIAGNOSIS — G8929 Other chronic pain: Secondary | ICD-10-CM | POA: Insufficient documentation

## 2021-06-22 DIAGNOSIS — F17210 Nicotine dependence, cigarettes, uncomplicated: Secondary | ICD-10-CM | POA: Insufficient documentation

## 2021-06-22 DIAGNOSIS — N9489 Other specified conditions associated with female genital organs and menstrual cycle: Secondary | ICD-10-CM | POA: Insufficient documentation

## 2021-06-22 DIAGNOSIS — R197 Diarrhea, unspecified: Secondary | ICD-10-CM | POA: Insufficient documentation

## 2021-06-22 DIAGNOSIS — R1084 Generalized abdominal pain: Secondary | ICD-10-CM | POA: Insufficient documentation

## 2021-06-22 DIAGNOSIS — E1169 Type 2 diabetes mellitus with other specified complication: Secondary | ICD-10-CM | POA: Insufficient documentation

## 2021-06-22 DIAGNOSIS — J45909 Unspecified asthma, uncomplicated: Secondary | ICD-10-CM | POA: Insufficient documentation

## 2021-06-22 DIAGNOSIS — J449 Chronic obstructive pulmonary disease, unspecified: Secondary | ICD-10-CM | POA: Diagnosis not present

## 2021-06-22 DIAGNOSIS — K769 Liver disease, unspecified: Secondary | ICD-10-CM | POA: Diagnosis not present

## 2021-06-22 DIAGNOSIS — I110 Hypertensive heart disease with heart failure: Secondary | ICD-10-CM | POA: Insufficient documentation

## 2021-06-22 DIAGNOSIS — Z794 Long term (current) use of insulin: Secondary | ICD-10-CM | POA: Insufficient documentation

## 2021-06-22 LAB — CBC WITH DIFFERENTIAL/PLATELET
Abs Immature Granulocytes: 0.05 10*3/uL (ref 0.00–0.07)
Basophils Absolute: 0.1 10*3/uL (ref 0.0–0.1)
Basophils Relative: 1 %
Eosinophils Absolute: 0.4 10*3/uL (ref 0.0–0.5)
Eosinophils Relative: 3 %
HCT: 34.3 % — ABNORMAL LOW (ref 36.0–46.0)
Hemoglobin: 10.5 g/dL — ABNORMAL LOW (ref 12.0–15.0)
Immature Granulocytes: 0 %
Lymphocytes Relative: 22 %
Lymphs Abs: 2.7 10*3/uL (ref 0.7–4.0)
MCH: 21.9 pg — ABNORMAL LOW (ref 26.0–34.0)
MCHC: 30.6 g/dL (ref 30.0–36.0)
MCV: 71.5 fL — ABNORMAL LOW (ref 80.0–100.0)
Monocytes Absolute: 0.8 10*3/uL (ref 0.1–1.0)
Monocytes Relative: 6 %
Neutro Abs: 8 10*3/uL — ABNORMAL HIGH (ref 1.7–7.7)
Neutrophils Relative %: 68 %
Platelets: 240 10*3/uL (ref 150–400)
RBC: 4.8 MIL/uL (ref 3.87–5.11)
RDW: 22.8 % — ABNORMAL HIGH (ref 11.5–15.5)
WBC: 11.9 10*3/uL — ABNORMAL HIGH (ref 4.0–10.5)
nRBC: 0 % (ref 0.0–0.2)

## 2021-06-22 LAB — URINALYSIS, ROUTINE W REFLEX MICROSCOPIC
Bilirubin Urine: NEGATIVE
Glucose, UA: NEGATIVE mg/dL
Hgb urine dipstick: NEGATIVE
Ketones, ur: NEGATIVE mg/dL
Leukocytes,Ua: NEGATIVE
Nitrite: NEGATIVE
Protein, ur: NEGATIVE mg/dL
Specific Gravity, Urine: 1.02 (ref 1.005–1.030)
pH: 5 (ref 5.0–8.0)

## 2021-06-22 LAB — COMPREHENSIVE METABOLIC PANEL
ALT: 15 U/L (ref 0–44)
AST: 31 U/L (ref 15–41)
Albumin: 3.6 g/dL (ref 3.5–5.0)
Alkaline Phosphatase: 95 U/L (ref 38–126)
Anion gap: 9 (ref 5–15)
BUN: 11 mg/dL (ref 6–20)
CO2: 23 mmol/L (ref 22–32)
Calcium: 9.9 mg/dL (ref 8.9–10.3)
Chloride: 108 mmol/L (ref 98–111)
Creatinine, Ser: 0.93 mg/dL (ref 0.44–1.00)
GFR, Estimated: >60 mL/min (ref >60)
Glucose, Bld: 181 mg/dL — ABNORMAL HIGH (ref 70–99)
Potassium: 4.1 mmol/L (ref 3.5–5.1)
Sodium: 140 mmol/L (ref 135–145)
Total Bilirubin: 0.5 mg/dL (ref 0.3–1.2)
Total Protein: 6.7 g/dL (ref 6.5–8.1)

## 2021-06-22 LAB — CBG MONITORING, ED: Glucose-Capillary: 243 mg/dL — ABNORMAL HIGH (ref 70–99)

## 2021-06-22 LAB — LIPASE, BLOOD: Lipase: 31 U/L (ref 11–51)

## 2021-06-22 LAB — I-STAT BETA HCG BLOOD, ED (MC, WL, AP ONLY): I-stat hCG, quantitative: <5 m[IU]/mL (ref <5)

## 2021-06-22 MED ORDER — OXYCODONE-ACETAMINOPHEN 5-325 MG PO TABS
1.0000 | ORAL_TABLET | Freq: Once | ORAL | Status: AC
  Administered 2021-06-22: 1 via ORAL
  Filled 2021-06-22: qty 1

## 2021-06-22 MED ORDER — DICYCLOMINE HCL 10 MG/ML IM SOLN
20.0000 mg | Freq: Once | INTRAMUSCULAR | Status: AC
  Administered 2021-06-22: 20 mg via INTRAMUSCULAR
  Filled 2021-06-22: qty 2

## 2021-06-22 MED ORDER — DICYCLOMINE HCL 20 MG PO TABS
20.0000 mg | ORAL_TABLET | Freq: Two times a day (BID) | ORAL | 0 refills | Status: DC
Start: 2021-06-22 — End: 2021-06-24

## 2021-06-22 MED ORDER — FENTANYL CITRATE PF 50 MCG/ML IJ SOSY
50.0000 ug | PREFILLED_SYRINGE | Freq: Once | INTRAMUSCULAR | Status: AC
  Administered 2021-06-22: 50 ug via INTRAVENOUS
  Filled 2021-06-22: qty 1

## 2021-06-22 MED ORDER — IOHEXOL 350 MG/ML SOLN
100.0000 mL | Freq: Once | INTRAVENOUS | Status: AC | PRN
  Administered 2021-06-22: 100 mL via INTRAVENOUS

## 2021-06-22 MED ORDER — SODIUM CHLORIDE 0.9 % IV BOLUS
1000.0000 mL | Freq: Once | INTRAVENOUS | Status: AC
  Administered 2021-06-22: 1000 mL via INTRAVENOUS

## 2021-06-22 MED ORDER — ONDANSETRON 4 MG PO TBDP
4.0000 mg | ORAL_TABLET | Freq: Once | ORAL | Status: AC
  Administered 2021-06-22: 4 mg via ORAL
  Filled 2021-06-22: qty 1

## 2021-06-22 NOTE — Discharge Instructions (Addendum)
Take medications to help with your symptoms. Make sure you are drinking plenty of fluids to prevent dehydration from your diarrhea. Is important for you to follow-up with a GI specialist as you have previously scheduled. Return to the ER if you start to experience worsening pain, bloody stools, continued vomiting, fever or chest pain.

## 2021-06-22 NOTE — ED Notes (Signed)
Patient transported to CT 

## 2021-06-22 NOTE — ED Provider Notes (Signed)
Emergency Medicine Provider Triage Evaluation Note  Jill Shaw , a 46 y.o. female  was evaluated in triage.  Pt complains of abdominal pain and diarrhea.  Symptoms started this morning and have been constant throughout the day.  Patient reports generalized abdominal pain.  No blood in her stool.  Reports she has been nauseated but no vomiting.  Reports this feels similar to her chronic abdominal pain, was seen for similar symptoms 2 days ago with reassuring work-up.  No medications at home for symptoms.  Review of Systems  Positive: Abdominal pain, diarrhea, nausea Negative: Fever, vomiting, blood in stool, chest pain  Physical Exam  BP (!) 162/90 (BP Location: Right Arm)   Pulse (!) 105   Temp 99.6 F (37.6 C)   Resp 20   Ht 5\' 4"  (1.626 m)   Wt 113.4 kg   SpO2 93%   BMI 42.91 kg/m  Gen:   Awake, appears uncomfortable Resp:  Normal effort  Abd:  Generalized tenderness to palpation throughout the abdomen MSK:   Moves extremities without difficulty  Other:    Medical Decision Making  Medically screening exam initiated at 2:58 AM.  Appropriate orders placed.  Jill Shaw was informed that the remainder of the evaluation will be completed by another provider, this initial triage assessment does not replace that evaluation, and the importance of remaining in the ED until their evaluation is complete.     [name], PA-C 06/22/21 08/22/21    [id number], MD 06/22/21 0400

## 2021-06-22 NOTE — Progress Notes (Addendum)
Inpatient Diabetes Program Recommendations  AACE/ADA: New Consensus Statement on Inpatient Glycemic Control (2015)  Target Ranges:  Prepandial:   less than 140 mg/dL      Peak postprandial:   less than 180 mg/dL (1-2 hours)      Critically ill patients:  140 - 180 mg/dL   Lab Results  Component Value Date   GLUCAP 243 (H) 06/22/2021   HGBA1C 9.5 (A) 06/14/2021   HGBA1C 9.5 06/14/2021   HGBA1C 9.5 (A) 06/14/2021   HGBA1C 9.5 (A) 06/14/2021    Review of Glycemic Control Results for Jill Shaw, Jill Shaw (MRN [id number]) as of 06/22/2021 08:49  Ref. Range 06/22/2021 07:31  Glucose-Capillary Latest Ref Range: 70 - 99 mg/dL 240 (H)   Diabetes history: DM 2 Outpatient Diabetes medications: Jardiance 10 mg Daily, Lantus 110 Daily, Humalog 40 units tid, Onglyza 5 mg Daily Current orders for Inpatient glycemic control:  None  A1c 9.5% on 06/14/21 [facility] to [facility]  Inpatient Diabetes Program Recommendations:   While waiting in ED: - Start Novolog 0-20 units Q4  Thanks,  Christena Deem RN, MSN, BC-ADM Inpatient Diabetes Coordinator Team Pager [phone] (8a-5p)

## 2021-06-22 NOTE — ED Provider Notes (Signed)
[facility] EMERGENCY DEPARTMENT Provider Note   CSN: [id number] Arrival date & time: 06/22/21  0244     History Chief Complaint  Patient presents with   Abdominal Pain   Diarrhea    Jill Shaw is a 45 y.o. female with a past medical history of diabetes, anemia, IBS presenting to the ED with a chief complaint of abdominal pain.  Patient has had intermittent abdominal pain which she describes as generalized for the past several months.  She is scheduled to see GI in a few weeks.  However yesterday she started experiencing diarrhea which is unusual for her.  She is having dry heaving but she states that this is typical.  She has been taking medication as prescribed by her PCP but does not feel that it is helping.  No sick contacts with similar symptoms.  No bloody stools.  No chest pain, fever, urinary symptoms, pelvic complaints.  Prior abdominal surgeries include C-section.  HPI     Past Medical History:  Diagnosis Date   Anemia    Arthritis    knees, hands   Asthma    Chronic diastolic (congestive) heart failure (HCC)    COPD (chronic obstructive pulmonary disease) (HCC)    Diabetes mellitus without complication (HCC)    type 2   Dysfunctional uterine bleeding    GERD (gastroesophageal reflux disease)    Hypertension    Neuromuscular disorder (HCC)    neuropathy feet   Seizures (HCC) 09/12/2017   pt states r/t stress and blood sugar - no meds last one 4 months ago, not seen neurologist   Sickle cell trait (HCC)    Smoker    Vitamin D deficiency 10/2019   Wears glasses     Patient Active Problem List   Diagnosis Date Noted   DM (diabetes mellitus), type 2, uncontrolled, with renal complications (HCC) 03/24/2021   Intractable nausea and vomiting 03/23/2021   Serotonin syndrome 03/14/2021   Overdose, undetermined intent, initial encounter 03/14/2021   Dyslipidemia 02/16/2021   Insulin resistance 02/16/2021   Type 2 diabetes mellitus without  complication, with long-term current use of insulin (HCC) 02/16/2021   Amitriptyline overdose, accidental or unintentional, initial encounter 01/09/2021   Ingestion of substance, undetermined intent, initial encounter 01/08/2021   Hyperglycemia due to type 2 diabetes mellitus (HCC) 01/08/2021   SIRS (systemic inflammatory response syndrome) (HCC) 01/08/2021   Exertional chest pain 12/31/2020   SOB (shortness of breath) 12/24/2020   DKA (diabetic ketoacidosis) (HCC) 10/15/2020   Hyperkalemia 10/15/2020   Severe sepsis (HCC) 10/15/2020   Acute metabolic encephalopathy 10/15/2020   Sinus tachycardia 10/14/2020   Precordial pain 10/14/2020   Abnormal findings on diagnostic imaging of lung 08/19/2020   Physical deconditioning 08/19/2020   History of endometrial ablation 07/27/2020   History of alcohol use 07/20/2020   Current moderate episode of major depressive disorder without prior episode (HCC) 07/20/2020   Sickle cell trait (HCC) 07/19/2020   Elevated d-dimer 07/13/2020   Healthcare maintenance 07/12/2020   Exertional dyspnea 07/12/2020   Atypical chest pain 07/12/2020   At risk for obstructive sleep apnea 07/12/2020   DKA (diabetic ketoacidoses) 07/02/2020   Acute on chronic diastolic (congestive) heart failure (HCC) 06/22/2020   Generalized abdominal pain 03/08/2020   Chronic diastolic CHF (congestive heart failure) (HCC) 03/08/2020   Chronic anemia 12/19/2019   Elevated sed rate 11/26/2019   Elevated C-reactive protein (CRP) 11/26/2019   Hypoglycemia 02/07/2019   Hypotension 02/07/2019   Lactic acidosis 02/07/2019  Right ankle pain 02/07/2019   Chronic obstructive pulmonary disease ([place]) 02/05/2019   Vitamin D deficiency 05/31/2017   Gastroesophageal reflux disease 05/24/2017   Abnormal uterine bleeding (AUB) 05/17/2017   Anemia of chronic disease 04/06/2017   Knee pain, chronic 03/27/2016   Essential hypertension 03/27/2016   Obesity, Class III, BMI 40-49.9 (morbid  obesity) ([place]) 03/27/2016   Irritable bowel syndrome with constipation 03/27/2016   Tobacco dependence 03/27/2016   Arthropathy, lower leg 05/04/2013   CTS (carpal tunnel syndrome) 05/04/2013   Peripheral edema 05/04/2013   Chronic pain 05/13/2012   Diabetic gastroparesis ([place]) 11/06/2006   Hot flashes 11/06/2006    Past Surgical History:  Procedure Laterality Date   CESAREAN SECTION     x 1. for twins   DILATION AND CURETTAGE OF UTERUS N/A 08/20/2019   Procedure: DILATATION AND CURETTAGE;  Surgeon: Emily Filbert, MD;  Location: [place];  Service: Gynecology;  Laterality: N/A;   ENDOMETRIAL ABLATION N/A 08/20/2019   Procedure: [place] Ablation;  Surgeon: Emily Filbert, MD;  Location: [place];  Service: Gynecology;  Laterality: N/A;   EYE SURGERY Bilateral    laser right and cataract removed left eye   RADIOLOGY WITH ANESTHESIA N/A 09/16/2019   Procedure: MRI WITH ANESTHESIA   L SPINE WITHOUT CONTRAST, T SPINE WITHOUT CONTRAST , CERVICAL WITHOUT CONTRAST;  Surgeon: Radiologist, Medication, MD;  Location: [place];  Service: Radiology;  Laterality: N/A;   TUBAL LIGATION     interval BTL   UPPER GI ENDOSCOPY  07/2017     OB History     Gravida  3   Para      Term      Preterm      AB      Living  3      SAB      IAB      Ectopic      Multiple      Live Births  3        Obstetric Comments  Vaginal x 1 and then c-section for twins         Family History  Problem Relation Age of Onset   Diabetes Mother    Hypertension Mother     Social History   Tobacco Use   Smoking status: Some Days    Packs/day: 0.25    Years: 26.00    Pack years: 6.50    Types: Cigarettes    Last attempt to quit: 09/13/2020    Years since quitting: 0.7   Smokeless tobacco: Never   Tobacco comments:    4-5 cigarettes/day  Vaping Use   Vaping Use: Never used  Substance Use Topics   Alcohol use: No   Drug use: No    Home Medications Prior to Admission medications   Medication  Sig Start Date End Date Taking? Authorizing Provider  ACCU-CHEK GUIDE test strip USE AS DIRECTED UP TO FOUR TIMES DAILY Patient taking differently: 1 each by Other route in the morning, at noon, in the evening, and at bedtime. 11/11/20  Yes Vevelyn Francois, NP  Accu-Chek Softclix Lancets lancets USE AS DIRECTED UP TO FOUR TIMES DAILY 02/17/21  Yes Vevelyn Francois, NP  albuterol (PROVENTIL) (2.5 MG/3ML) 0.083% nebulizer solution Take 3 mLs (2.5 mg total) by nebulization every 6 (six) hours as needed for wheezing or shortness of breath. 09/27/20  Yes Icard, Bradley L, DO  albuterol (VENTOLIN HFA) 108 (90 Base) MCG/ACT inhaler Inhale 2 puffs into the lungs every  6 (six) hours as needed for wheezing or shortness of breath. 09/27/20  Yes Icard, Bradley L, DO  blood glucose meter kit and supplies KIT 1 each by Other route See admin instructions. Dispense based on patient and insurance preference. Use up to four times daily as directed. (FOR ICD-9 250.00, 250.01). 09/02/20  Yes Vevelyn Francois, NP  Blood Pressure Monitoring (ADULT BLOOD PRESSURE CUFF LG) KIT 1 kit by Does not apply route daily. 05/05/21  Yes King, Diona Foley, NP  budesonide-formoterol (SYMBICORT) 160-4.5 MCG/ACT inhaler INHALE 2 PUFFS INTO THE LUNGS 2 (TWO) TIMES DAILY. Patient taking differently: Inhale 2 puffs into the lungs 2 (two) times daily. 09/27/20  Yes Icard, Octavio Graves, DO  cetirizine (ZYRTEC) 10 MG tablet Take 1 tablet (10 mg total) by mouth daily. 06/07/21  Yes Vevelyn Francois, NP  Dexlansoprazole (DEXILANT) 30 MG capsule Take 1 capsule (30 mg total) by mouth daily. 04/13/21 04/13/22 Yes Vevelyn Francois, NP  empagliflozin (JARDIANCE) 10 MG TABS tablet Take 1 tablet (10 mg total) by mouth daily before breakfast. 02/21/21  Yes Shamleffer, Melanie Crazier, MD  ferrous sulfate 325 (65 FE) MG tablet Take 1 tablet (325 mg total) by mouth 3 (three) times daily with meals. 12/02/20 12/02/21 Yes King, Diona Foley, NP  fluticasone (FLONASE) 50 MCG/ACT  nasal spray Place 2 sprays into both nostrils daily. 12/02/20  Yes Vevelyn Francois, NP  furosemide (LASIX) 40 MG tablet TAKE 1 TABLET(40 MG) BY MOUTH TWICE DAILY Patient taking differently: Take 40 mg by mouth 2 (two) times daily. 02/08/21  Yes Lorretta Harp, MD  Glucosamine Sulfate 1000 MG CAPS Take 1 capsule (1,000 mg total) by mouth 2 (two) times daily. Patient taking differently: Take 1,000 mg by mouth 2 (two) times daily. 08/22/19  Yes Hilts, Legrand Como, MD  Heating Pads (HEATING PAD MOIST/DRY KING SZ) PADS 1 each by Does not apply route 4 (four) times daily as needed. 02/14/21  Yes Raulkar, Clide Deutscher, MD  hydrochlorothiazide (MICROZIDE) 12.5 MG capsule Take 1 capsule (12.5 mg total) by mouth daily. 02/11/21 06/22/21 Yes Cantwell, Celeste C, PA-C  HYDROcodone-acetaminophen (NORCO) 10-325 MG tablet Take 1 tablet by mouth 2 (two) times daily as needed. Patient taking differently: Take 1 tablet by mouth 2 (two) times daily as needed for moderate pain. 06/06/21  Yes Bayard Hugger, NP  hydrocortisone 2.5 % cream Apply topically 2 (two) times daily. Patient taking differently: Apply 1 application topically 2 (two) times daily. 09/22/20  Yes King, Diona Foley, NP  hydroquinone 4 % cream APPLY TOPICALLY TWICE DAILY Patient taking differently: Apply 1 application topically 2 (two) times daily as needed (pain). 09/13/20  Yes King, Diona Foley, NP  insulin glargine (LANTUS SOLOSTAR) 100 UNIT/ML Solostar Pen Inject 110 Units into the skin daily. 03/11/21  Yes Shamleffer, Melanie Crazier, MD  insulin lispro (HUMALOG KWIKPEN) 100 UNIT/ML KwikPen Inject 40 Units into the skin 3 (three) times daily. 03/02/21  Yes Shamleffer, Melanie Crazier, MD  Insulin Pen Needle 31G X 8 MM MISC 1 Device by Does not apply route in the morning, at noon, in the evening, and at bedtime. 02/16/21  Yes Shamleffer, Melanie Crazier, MD  losartan (COZAAR) 50 MG tablet Take 1 tablet (50 mg total) by mouth daily. TAKE 1 TABLET($RemoveBefor'50MG'WYhBYFbclNmG$  TOTAL) BY MOUTH  EVERY DAY Patient taking differently: Take 50 mg by mouth daily. 12/02/20 12/02/21 Yes Vevelyn Francois, NP  metoprolol succinate (TOPROL-XL) 100 MG 24 hr tablet Take 100 mg by mouth daily. Take with  or immediately following a meal.   Yes [provider]  nicotine (NICODERM CQ - DOSED IN MG/24 HOURS) 14 mg/24hr patch Place 1 patch (14 mg total) onto the skin daily. 06/14/21 07/14/21 Yes Passmore, Jake Church I, NP  norethindrone (AYGESTIN) 5 MG tablet Take 2 tablets (10 mg total) by mouth in the morning, at noon, in the evening, and at bedtime. With bleeding Patient taking differently: Take 15 mg by mouth 3 (three) times daily. With bleeding 06/08/21  Yes Aletha Halim, MD  ondansetron (ZOFRAN ODT) 4 MG disintegrating tablet Take 1 tablet (4 mg total) by mouth every 8 (eight) hours as needed for nausea or vomiting. 06/20/21  Yes Davonna Belling, MD  potassium chloride SA (KLOR-CON) 20 MEQ tablet TAKE 1 TABLET(20 MEQ) BY MOUTH DAILY Patient taking differently: Take 20 mEq by mouth daily. 06/06/21  Yes Lorretta Harp, MD  rosuvastatin (CRESTOR) 5 MG tablet Take 1 tablet (5 mg total) by mouth daily. 08/11/20  Yes Lorretta Harp, MD  saxagliptin HCl (ONGLYZA) 5 MG TABS tablet Take 1 tablet (5 mg total) by mouth daily. 12/02/20 12/02/21 Yes King, Diona Foley, NP  senna-docusate (SENOKOT-S) 8.6-50 MG tablet Take 1 tablet by mouth at bedtime as needed for mild constipation. 03/24/21  Yes Debbe Odea, MD  spironolactone (ALDACTONE) 50 MG tablet TAKE 1 TABLET(50 MG) BY MOUTH DAILY Patient taking differently: Take 50 mg by mouth daily. 12/22/20  Yes Patwardhan, Manish J, MD  sucralfate (CARAFATE) 1 GM/10ML suspension Take 10 mLs (1 g total) by mouth 4 (four) times daily. 05/18/21 05/18/22 Yes Vevelyn Francois, NP  Tiotropium Bromide Monohydrate (SPIRIVA RESPIMAT) 2.5 MCG/ACT AERS Inhale 2 puffs into the lungs daily. 09/27/20  Yes Icard, Bradley L, DO  tiZANidine (ZANAFLEX) 4 MG tablet Take 1 tablet (4 mg total) by  mouth every 6 (six) hours as needed for muscle spasms. 04/04/21  Yes Raulkar, Clide Deutscher, MD  TOBRADEX ophthalmic solution Place 1 drop into the right eye 4 (four) times daily. 04/30/21  Yes [provider]  Vitamin D, Ergocalciferol, (DRISDOL) 1.25 MG (50000 UNIT) CAPS capsule Take 1 capsule (50,000 Units total) by mouth every 7 (seven) days for 8 doses. Patient taking differently: Take 50,000 Units by mouth every Sunday. 06/15/21 08/04/21 Yes Passmore, Jake Church I, NP  vitamin E (VITAMIN E) 180 MG (400 UNITS) capsule Take 1 capsule (400 Units total) by mouth daily. 01/07/21  Yes Raulkar, Clide Deutscher, MD  dicyclomine (BENTYL) 20 MG tablet Take 1 tablet (20 mg total) by mouth 2 (two) times daily. 06/22/21  Yes Anwyn Kriegel, PA-C    Allergies    Amitriptyline, Ketoprofen, Trazodone, Aspirin, Gabapentin, Ibuprofen, Liraglutide, Naproxen, Omeprazole-sodium bicarbonate, Sulfa antibiotics, and Tramadol  Review of Systems   Review of Systems  HENT:  Negative for ear pain, rhinorrhea and sneezing.   Eyes:  Negative for photophobia and visual disturbance.  Respiratory:  Negative for chest tightness and wheezing.   Skin:  Negative for rash.  Neurological:  Negative for dizziness, weakness and light-headedness.   Physical Exam Updated Vital Signs BP (!) 153/60   Pulse 87   Temp 99.1 F (37.3 C)   Resp 20   Ht $R'5\' 4"'iq$  (1.626 m)   Wt 113.4 kg   SpO2 100%   BMI 42.91 kg/m   Physical Exam Vitals and nursing note reviewed.  Constitutional:      General: She is not in acute distress.    Appearance: She is well-developed.  HENT:     Head:  Normocephalic and atraumatic.     Nose: Nose normal.  Eyes:     General: No scleral icterus.       Left eye: No discharge.     Conjunctiva/sclera: Conjunctivae normal.  Cardiovascular:     Rate and Rhythm: Normal rate and regular rhythm.     Heart sounds: Normal heart sounds. No murmur heard.   No friction rub. No gallop.  Pulmonary:     Effort: Pulmonary  effort is normal. No respiratory distress.     Breath sounds: Normal breath sounds.  Abdominal:     General: Bowel sounds are normal. There is no distension.     Palpations: Abdomen is soft.     Tenderness: There is generalized abdominal tenderness. There is no guarding.  Musculoskeletal:        General: Normal range of motion.     Cervical back: Normal range of motion and neck supple.  Skin:    General: Skin is warm and dry.     Findings: No rash.  Neurological:     Mental Status: She is alert.     Motor: No abnormal muscle tone.     Coordination: Coordination normal.    ED Results / Procedures / Treatments   Labs (all labs ordered are listed, but only abnormal results are displayed) Labs Reviewed  COMPREHENSIVE METABOLIC PANEL - Abnormal; Notable for the following components:      Result Value   Glucose, Bld 181 (*)    All other components within normal limits  CBC WITH DIFFERENTIAL/PLATELET - Abnormal; Notable for the following components:   WBC 11.9 (*)    Hemoglobin 10.5 (*)    HCT 34.3 (*)    MCV 71.5 (*)    MCH 21.9 (*)    RDW 22.8 (*)    Neutro Abs 8.0 (*)    All other components within normal limits  URINALYSIS, ROUTINE W REFLEX MICROSCOPIC - Abnormal; Notable for the following components:   APPearance HAZY (*)    All other components within normal limits  CBG MONITORING, ED - Abnormal; Notable for the following components:   Glucose-Capillary 243 (*)    All other components within normal limits  LIPASE, BLOOD  I-STAT BETA HCG BLOOD, ED ([facility], [facility], [facility] ONLY)    EKG None  Radiology CT ABDOMEN PELVIS W CONTRAST  Result Date: 06/22/2021 CLINICAL DATA:  Epigastric pain EXAM: CT ABDOMEN AND PELVIS WITH CONTRAST TECHNIQUE: Multidetector CT imaging of the abdomen and pelvis was performed using the standard protocol following bolus administration of intravenous contrast. CONTRAST:  176mL OMNIPAQUE IOHEXOL 350 MG/ML SOLN COMPARISON:  04/26/2021 FINDINGS: Lower chest: No  acute abnormality. Hepatobiliary: Multifocal areas of geographic low attenuation are again noted involving both lobes of liver which are favored to represent areas of focal fatty deposition. No suspicious liver abnormality identified. Unremarkable appearance of the gallbladder. No biliary duct dilatation. Pancreas: Unremarkable. No pancreatic ductal dilatation or surrounding inflammatory changes. Spleen: Normal in size without focal abnormality. Adrenals/Urinary Tract: Adrenal glands are unremarkable. Kidneys are normal, without renal calculi, focal lesion, or hydronephrosis. Bladder is unremarkable. Stomach/Bowel: Stomach appears normal. The appendix is visualized and appears normal. No bowel wall thickening, inflammation, or distension. Vascular/Lymphatic: No significant vascular findings are present. No enlarged abdominal or pelvic lymph nodes. Reproductive: Uterus and bilateral adnexa are unremarkable. Other: No free fluid or fluid collections. No signs of pneumoperitoneum. Musculoskeletal: No acute or significant osseous findings. IMPRESSION: 1. No acute findings within the abdomen or pelvis. 2. Multifocal areas  of geographic low attenuation are again noted involving both lobes of liver which are favored to represent areas of focal fatty deposition. As mentioned previously this could be definitively characterized with follow-up, nonemergent abdominal MRI without with contrast material Electronically Signed   By: Kerby Moors M.D.   On: 06/22/2021 14:40    Procedures Procedures   Medications Ordered in ED Medications  ondansetron (ZOFRAN-ODT) disintegrating tablet 4 mg (4 mg Oral Given 06/22/21 0320)  oxyCODONE-acetaminophen (PERCOCET/ROXICET) 5-325 MG per tablet 1 tablet (1 tablet Oral Given 06/22/21 0320)  sodium chloride 0.9 % bolus 1,000 mL (0 mLs Intravenous Stopped 06/22/21 1502)  dicyclomine (BENTYL) injection 20 mg (20 mg Intramuscular Given 06/22/21 1154)  iohexol (OMNIPAQUE) 350 MG/ML injection  100 mL (100 mLs Intravenous Contrast Given 06/22/21 1406)  fentaNYL (SUBLIMAZE) injection 50 mcg (50 mcg Intravenous Given 06/22/21 1443)    ED Course  I have reviewed the triage vital signs and the nursing notes.  Pertinent labs & imaging results that were available during my care of the patient were reviewed by me and considered in my medical decision making (see chart for details).    MDM Rules/Calculators/A&P                           45 year old female presenting to the ED for continued abdominal pain.  She is now having diarrhea which is unusual for her.  No recent antibiotic use.  She has had intermittent abdominal pain for the past several months with associated nausea.  This is also persistent.  She is on a medication by her PCP, possibly Carafate?  She does not feel that the medication is helping.  She is seeing GI in a few weeks and has not done so yet.  On exam there is generalized tenderness.  No rebound or guarding.  No focal tenderness.  Lab work significant for leukocytosis of 11.9, unremarkable UA, CMP, lipase.  hCG is negative.  Will obtain CT to rule out structural cause of symptoms and attempt to treat with medication.  CT of the abdomen pelvis is negative for acute findings.  She again has fatty deposition noted on her liver which has been present in her prior CT scans.  I suspect that her symptoms are due to her chronic abdominal pain and could be due to overlying viral cause for her diarrhea.  We will treat with antiemetics and antispasmodics.  We will have her increase her hydration.  Encouraged her to see GI as they will ultimately provide her with further evaluation of her chronic symptoms.  She is in a pain management program so informed her that I will be unable to give her further opioid pain medication for home.  Patient able to tolerate p.o. intake without difficulty here.  Return precautions given.   Patient is hemodynamically stable, in NAD, and able to ambulate in the  ED. Evaluation does not show pathology that would require ongoing emergent intervention or inpatient treatment. I explained the diagnosis to the patient. Pain has been managed and has no complaints prior to discharge. Patient is comfortable with above plan and is stable for discharge at this time. All questions were answered prior to disposition. Strict return precautions for returning to the ED were discussed. Encouraged follow up with PCP.   An After Visit Summary was printed and given to the patient.   Portions of this note were generated with [employer]. Dictation errors may occur despite best attempts  at proofreading.  Final Clinical Impression(s) / ED Diagnoses Final diagnoses:  Diarrhea, unspecified type  Chronic abdominal pain    Rx / DC Orders ED Discharge Orders          Ordered    dicyclomine (BENTYL) 20 MG tablet  2 times daily        06/22/21 [place], [place], PA-C 06/22/21 1506    Lennice Sites, DO 06/22/21 1530

## 2021-06-22 NOTE — ED Triage Notes (Signed)
T c/o abd pain and diarrhea since this morning.

## 2021-06-24 ENCOUNTER — Other Ambulatory Visit: Payer: Self-pay | Admitting: Nurse Practitioner

## 2021-06-24 DIAGNOSIS — N939 Abnormal uterine and vaginal bleeding, unspecified: Secondary | ICD-10-CM

## 2021-06-24 MED ORDER — DICYCLOMINE HCL 20 MG PO TABS
20.0000 mg | ORAL_TABLET | Freq: Three times a day (TID) | ORAL | 11 refills | Status: DC
Start: 2021-06-24 — End: 2021-08-21

## 2021-06-24 NOTE — Progress Notes (Signed)
   [facility] [address] [place], [place]  [address] Phone:  [phone]   Fax:  [phone] 

## 2021-06-27 ENCOUNTER — Encounter: Payer: Self-pay | Admitting: Internal Medicine

## 2021-06-27 ENCOUNTER — Other Ambulatory Visit: Payer: Self-pay | Admitting: Obstetrics and Gynecology

## 2021-06-27 ENCOUNTER — Other Ambulatory Visit: Payer: Self-pay

## 2021-06-27 ENCOUNTER — Ambulatory Visit (INDEPENDENT_AMBULATORY_CARE_PROVIDER_SITE_OTHER): Payer: Medicaid Other | Admitting: Internal Medicine

## 2021-06-27 VITALS — BP 160/80 | HR 100 | Ht 64.0 in | Wt 260.0 lb

## 2021-06-27 DIAGNOSIS — Z794 Long term (current) use of insulin: Secondary | ICD-10-CM | POA: Diagnosis not present

## 2021-06-27 DIAGNOSIS — E119 Type 2 diabetes mellitus without complications: Secondary | ICD-10-CM | POA: Diagnosis not present

## 2021-06-27 DIAGNOSIS — N939 Abnormal uterine and vaginal bleeding, unspecified: Secondary | ICD-10-CM

## 2021-06-27 LAB — GLUCOSE, POCT (MANUAL RESULT ENTRY): POC Glucose: 129 mg/dl — AB (ref 70–99)

## 2021-06-27 MED ORDER — HUMALOG KWIKPEN 200 UNIT/ML ~~LOC~~ SOPN: PEN_INJECTOR | SUBCUTANEOUS | 3 refills | Status: DC

## 2021-06-27 MED ORDER — EMPAGLIFLOZIN 25 MG PO TABS: 25.0000 mg | ORAL_TABLET | Freq: Every day | ORAL | 3 refills | Status: DC

## 2021-06-27 MED ORDER — DEXCOM G6 SENSOR MISC: 1.0000 | 3 refills | Status: DC

## 2021-06-27 MED ORDER — NORETHINDRONE ACETATE 5 MG PO TABS: 15.0000 mg | ORAL_TABLET | Freq: Three times a day (TID) | ORAL | 0 refills | Status: DC

## 2021-06-27 MED ORDER — LANTUS SOLOSTAR 100 UNIT/ML ~~LOC~~ SOPN: 110.0000 [IU] | PEN_INJECTOR | Freq: Every day | SUBCUTANEOUS | 3 refills | Status: DC

## 2021-06-27 MED ORDER — DEXCOM G6 TRANSMITTER MISC: 1.0000 | 3 refills | Status: DC

## 2021-06-27 NOTE — Patient Instructions (Addendum)
-   Increase Jardiance to 25 mg daiy  - Continue Onglyza 5 mg , 1 tablet daily  - Continue Lantus  110 units once daily  - Decrease  Humalog to 50  units with each meal  -  Humalog correctional insulin: ADD extra units on insulin to your meal-time Humalog dose if your blood sugars are higher than 180. Use the scale below to help guide you:   Blood sugar before meal Number of units to inject  Less than 180 0 unit  181 -  190 1 units  191 -  200 2 units  201 -  210 3 units  211 -  220 4 units  221 -  230 5 units  231 -  240 6 units  241 -  250 7 units  251 -  260 8 units  261 - 270 9 units   271 - 280 10 units   281 - 290 11 units  291 - 300 12 units   301 - 310 13 units      HOW TO TREAT LOW BLOOD SUGARS (Blood sugar LESS THAN 70 MG/DL) Please follow the RULE OF 15 for the treatment of hypoglycemia treatment (when your (blood sugars are less than 70 mg/dL)   STEP 1: Take 15 grams of carbohydrates when your blood sugar is low, which includes:  3-4 GLUCOSE TABS  OR 3-4 OZ OF JUICE OR REGULAR SODA OR ONE TUBE OF GLUCOSE GEL    STEP 2: RECHECK blood sugar in 15 MINUTES STEP 3: If your blood sugar is still low at the 15 minute recheck --> then, go back to STEP 1 and treat AGAIN with another 15 grams of carbohydrates.

## 2021-06-27 NOTE — Progress Notes (Signed)
Name: Jill Shaw  Age/ Sex: 45 y.o., female   MRN/ DOB: [id number], [birth year]     PCP: Vevelyn Francois, NP   Reason for Endocrinology Evaluation: Type 2 Diabetes Mellitus  Initial Endocrine Consultative Visit: 02/16/2021    PATIENT IDENTIFIER: [name] is a 45 y.o. female with a past medical history of T2DM, OSA, HTN , HFpEF and sickle cell trait. The patient has followed with Endocrinology clinic since 02/16/2021 for consultative assistance with management of her diabetes.  DIABETIC HISTORY:  Jill Shaw was diagnosed with DM in 2008, she has been on liraglutide in the past. Her hemoglobin A1c has ranged from 7.1% in 2019, peaking at 9.5% in 2021.   On her initial visit to our clinic her A1c was 10.7%, she was on Onglyza and Humalog mix.  We started [place], continued Onglyza, and switch insulin mix to basal/prandial    She was screened for cushingoid features but was noted to have urinary free cortisol was normal at 10.4 MCG  (02/22/2021) SUBJECTIVE:   During the last visit (02/16/2021): A1c 10.7%We started [place], continued Onglyza, and switch insulin mix to basal/prandial  Today (06/27/2021): Jill Shaw is here for follow-up on diabetes management.  She checks her blood sugars 2-3 times daily, she did not bring meter. The patient has had hypoglycemic episodes once since her last visit here.    She was recently seen by her PCP for abdominal pain, lipase normal. Has GI appointment next month . Has nausea and diarrhea.  Follows with pain management    HOME DIABETES REGIMEN:  Jardiance 10 mg daily  Onglyza 5 mg daily Lantus 110 units daily Humalog 40 units 3 times daily before every meal- takes 55 units      Statin: Yes ACE-I/ARB: Yes Prior Diabetic Education: Yes   METER DOWNLOAD SUMMARY: Did not bring     DIABETIC COMPLICATIONS: Microvascular complications:  Neuropathy, left eye retinopathy Denies: CKD Last Eye Exam: Completed  04/2021  Macrovascular complications:   Denies: CAD, CVA, PVD   HISTORY:  Past Medical History:  Past Medical History:  Diagnosis Date  . Anemia   . Arthritis    knees, hands  . Asthma   . Chronic diastolic (congestive) heart failure ([place])   . COPD (chronic obstructive pulmonary disease) ([place])   . Diabetes mellitus without complication ([place])    type 2  . Dysfunctional uterine bleeding   . GERD (gastroesophageal reflux disease)   . Hypertension   . Neuromuscular disorder ([facility])    neuropathy feet  . Seizures ([place]) 09/12/2017   pt states r/t stress and blood sugar - no meds last one 4 months ago, not seen neurologist  . Sickle cell trait ([place])   . Smoker   . Vitamin D deficiency 10/2019  . Wears glasses    Past Surgical History:  Past Surgical History:  Procedure Laterality Date  . CESAREAN SECTION     x 1. for twins  . DILATION AND CURETTAGE OF UTERUS N/A 08/20/2019   Procedure: DILATATION AND CURETTAGE;  Surgeon: Emily Filbert, MD;  Location: [facility];  Service: Gynecology;  Laterality: N/A;  . ENDOMETRIAL ABLATION N/A 08/20/2019   Procedure: [place] Ablation;  Surgeon: Emily Filbert, MD;  Location: [facility];  Service: Gynecology;  Laterality: N/A;  . EYE SURGERY Bilateral    laser right and cataract removed left eye  . RADIOLOGY WITH ANESTHESIA N/A 09/16/2019   Procedure: MRI WITH ANESTHESIA   L SPINE WITHOUT CONTRAST, T  SPINE WITHOUT CONTRAST , CERVICAL WITHOUT CONTRAST;  Surgeon: Radiologist, Medication, MD;  Location: [facility] [place] [facility];  Service: Radiology;  Laterality: N/A;  . TUBAL LIGATION     interval BTL  . UPPER GI ENDOSCOPY  07/2017   Social History:  reports that she has been smoking cigarettes. She has a 6.50 pack-year smoking history. She has never used smokeless tobacco. She reports that she does not drink alcohol and does not use drugs. Family History:  Family History  Problem Relation Age of Onset  . Diabetes Mother   . Hypertension Mother      HOME  MEDICATIONS: Allergies as of 06/27/2021       Reactions   Amitriptyline Other (See Comments)   Coma   Ketoprofen Nausea And Vomiting   Trazodone Nausea And Vomiting   Aspirin Nausea Only   Gabapentin Nausea And Vomiting, Other (See Comments)   upset stomach   Ibuprofen Nausea And Vomiting   Liraglutide Nausea And Vomiting   Naproxen Nausea And Vomiting   Omeprazole-sodium Bicarbonate Nausea And Vomiting   Sulfa Antibiotics Nausea And Vomiting   Tramadol Nausea And Vomiting, Other (See Comments)   stomach upset        Medication List        Accurate as of June 27, 2021  1:10 PM. If you have any questions, ask your nurse or doctor.          STOP taking these medications    insulin lispro 100 UNIT/ML KwikPen Commonly known as: HumaLOG KwikPen Replaced by: HumaLOG KwikPen 200 UNIT/ML KwikPen Stopped by: Dorita Sciara, MD       TAKE these medications    Accu-Chek Guide test strip Generic drug: glucose blood USE AS DIRECTED UP TO FOUR TIMES DAILY What changed: See the new instructions.   Accu-Chek Softclix Lancets lancets USE AS DIRECTED UP TO FOUR TIMES DAILY   Adult Blood Pressure Cuff Lg Kit 1 kit by Does not apply route daily.   albuterol (2.5 MG/3ML) 0.083% nebulizer solution Commonly known as: PROVENTIL Take 3 mLs (2.5 mg total) by nebulization every 6 (six) hours as needed for wheezing or shortness of breath.   albuterol 108 (90 Base) MCG/ACT inhaler Commonly known as: VENTOLIN HFA Inhale 2 puffs into the lungs every 6 (six) hours as needed for wheezing or shortness of breath.   blood glucose meter kit and supplies Kit 1 each by Other route See admin instructions. Dispense based on patient and insurance preference. Use up to four times daily as directed. (FOR ICD-9 250.00, 250.01).   budesonide-formoterol 160-4.5 MCG/ACT inhaler Commonly known as: Symbicort INHALE 2 PUFFS INTO THE LUNGS 2 (TWO) TIMES DAILY. What changed:  how much to  take how to take this when to take this additional instructions   cetirizine 10 MG tablet Commonly known as: ZYRTEC Take 1 tablet (10 mg total) by mouth daily.   Dexcom G6 Sensor Misc 1 Device by Does not apply route as directed. Started by: Dorita Sciara, MD   Dexcom G6 Transmitter Misc 1 Device by Does not apply route as directed. Started by: Dorita Sciara, MD   Dexlansoprazole 30 MG capsule Commonly known as: Dexilant Take 1 capsule (30 mg total) by mouth daily.   dicyclomine 20 MG tablet Commonly known as: BENTYL Take 1 tablet (20 mg total) by mouth 3 (three) times daily before meals.   empagliflozin 25 MG Tabs tablet Commonly known as: Jardiance Take 1 tablet (25 mg total) by mouth daily before  breakfast. What changed:  medication strength how much to take Changed by: Dorita Sciara, MD   ferrous sulfate 325 (65 FE) MG tablet Take 1 tablet (325 mg total) by mouth 3 (three) times daily with meals.   fluticasone 50 MCG/ACT nasal spray Commonly known as: FLONASE Place 2 sprays into both nostrils daily.   furosemide 40 MG tablet Commonly known as: LASIX TAKE 1 TABLET(40 MG) BY MOUTH TWICE DAILY What changed: See the new instructions.   Glucosamine Sulfate 1000 MG Caps Take 1 capsule (1,000 mg total) by mouth 2 (two) times daily.   Heating Pad Moist/Dry King Sz Pads 1 each by Does not apply route 4 (four) times daily as needed.   HumaLOG KwikPen 200 UNIT/ML KwikPen Generic drug: insulin lispro Max daily 180 units Replaces: insulin lispro 100 UNIT/ML KwikPen Started by: Dorita Sciara, MD   hydrochlorothiazide 12.5 MG capsule Commonly known as: MICROZIDE Take 1 capsule (12.5 mg total) by mouth daily.   HYDROcodone-acetaminophen 10-325 MG tablet Commonly known as: NORCO Take 1 tablet by mouth 2 (two) times daily as needed. What changed: reasons to take this   hydrocortisone 2.5 % cream Apply topically 2 (two) times  daily. What changed: how much to take   hydroquinone 4 % cream APPLY TOPICALLY TWICE DAILY What changed:  how much to take when to take this reasons to take this   Insulin Pen Needle 31G X 8 MM Misc 1 Device by Does not apply route in the morning, at noon, in the evening, and at bedtime.   Lantus SoloStar 100 UNIT/ML Solostar Pen Generic drug: insulin glargine Inject 110 Units into the skin daily.   losartan 50 MG tablet Commonly known as: COZAAR Take 1 tablet (50 mg total) by mouth daily. TAKE 1 TABLET($RemoveBefor'50MG'BCcebfTtSNox$  TOTAL) BY MOUTH EVERY DAY What changed: additional instructions   metoprolol succinate 100 MG 24 hr tablet Commonly known as: TOPROL-XL Take 100 mg by mouth daily. Take with or immediately following a meal.   nicotine 14 mg/24hr patch Commonly known as: NICODERM CQ - dosed in mg/24 hours Place 1 patch (14 mg total) onto the skin daily.   norethindrone 5 MG tablet Commonly known as: Aygestin Take 3 tablets (15 mg total) by mouth 3 (three) times daily. With bleeding   ondansetron 4 MG disintegrating tablet Commonly known as: Zofran ODT Take 1 tablet (4 mg total) by mouth every 8 (eight) hours as needed for nausea or vomiting.   potassium chloride SA 20 MEQ tablet Commonly known as: KLOR-CON TAKE 1 TABLET(20 MEQ) BY MOUTH DAILY What changed: See the new instructions.   rosuvastatin 5 MG tablet Commonly known as: Crestor Take 1 tablet (5 mg total) by mouth daily.   saxagliptin HCl 5 MG Tabs tablet Commonly known as: Onglyza Take 1 tablet (5 mg total) by mouth daily.   senna-docusate 8.6-50 MG tablet Commonly known as: Senokot-S Take 1 tablet by mouth at bedtime as needed for mild constipation.   Spiriva Respimat 2.5 MCG/ACT Aers Generic drug: Tiotropium Bromide Monohydrate Inhale 2 puffs into the lungs daily.   spironolactone 50 MG tablet Commonly known as: ALDACTONE TAKE 1 TABLET(50 MG) BY MOUTH DAILY What changed: See the new instructions.    sucralfate 1 GM/10ML suspension Commonly known as: Carafate Take 10 mLs (1 g total) by mouth 4 (four) times daily.   tiZANidine 4 MG tablet Commonly known as: Zanaflex Take 1 tablet (4 mg total) by mouth every 6 (six) hours as needed for muscle  spasms.   TobraDex ophthalmic solution Generic drug: tobramycin-dexamethasone Place 1 drop into the right eye 4 (four) times daily.   Vitamin D (Ergocalciferol) 1.25 MG (50000 UNIT) Caps capsule Commonly known as: DRISDOL Take 1 capsule (50,000 Units total) by mouth every 7 (seven) days for 8 doses. What changed: when to take this   vitamin E 180 MG (400 UNITS) capsule Commonly known as: vitamin E Take 1 capsule (400 Units total) by mouth daily.         OBJECTIVE:   Vital Signs: BP (!) 160/80 (BP Location: Left Arm, Patient Position: Sitting, Cuff Size: Large)   Pulse 100   Ht $R'5\' 4"'Ur$  (1.626 m)   Wt 260 lb (117.9 kg)   SpO2 99%   BMI 44.63 kg/m   Wt Readings from Last 3 Encounters:  06/27/21 260 lb (117.9 kg)  06/22/21 250 lb (113.4 kg)  06/14/21 268 lb 0.6 oz (121.6 kg)     Exam: General: Pt appears well and is in NAD  Lungs: Clear with good BS bilat with no rales, rhonchi, or wheezes  Heart: RRR with normal S1 and S2 and no gallops; no murmurs; no rub  Extremities: No pretibial edema.   Neuro: MS is good with appropriate affect, pt is alert and Ox3        DM foot exam: 02/16/2021   The skin of the feet is intact without sores or ulcerations. The pedal pulses are 2+ on right and 2+ on left. The sensation is decreased to a screening 5.07, 10 gram monofilament bilaterally    DATA REVIEWED:  Lab Results  Component Value Date   HGBA1C 9.5 (A) 06/14/2021   HGBA1C 9.5 06/14/2021   HGBA1C 9.5 (A) 06/14/2021   HGBA1C 9.5 (A) 06/14/2021   Lab Results  Component Value Date   LDLCALC 92 06/14/2021   CREATININE 0.93 06/22/2021      Lab Results  Component Value Date   CHOL 158 06/14/2021   HDL 36 (L) 06/14/2021    LDLCALC 92 06/14/2021   TRIG 171 (H) 06/14/2021   CHOLHDL 4.4 06/14/2021        Results for KIEANNA, ROLLO (MRN [id number]) as of 06/27/2021 10:25  Ref. Range 06/22/2021 03:12  Sodium Latest Ref Range: 135 - 145 mmol/L 140  Potassium Latest Ref Range: 3.5 - 5.1 mmol/L 4.1  Chloride Latest Ref Range: 98 - 111 mmol/L 108  CO2 Latest Ref Range: 22 - 32 mmol/L 23  Glucose Latest Ref Range: 70 - 99 mg/dL 181 (H)  BUN Latest Ref Range: 6 - 20 mg/dL 11  Creatinine Latest Ref Range: 0.44 - 1.00 mg/dL 0.93  Calcium Latest Ref Range: 8.9 - 10.3 mg/dL 9.9  Anion gap Latest Ref Range: 5 - 15  9  Alkaline Phosphatase Latest Ref Range: 38 - 126 U/L 95  Albumin Latest Ref Range: 3.5 - 5.0 g/dL 3.6  Lipase Latest Ref Range: 11 - 51 U/L 31  AST Latest Ref Range: 15 - 41 U/L 31  ALT Latest Ref Range: 0 - 44 U/L 15  Total Protein Latest Ref Range: 6.5 - 8.1 g/dL 6.7  Total Bilirubin Latest Ref Range: 0.3 - 1.2 mg/dL 0.5  GFR, Estimated Latest Ref Range: >60 mL/min >60   ASSESSMENT / PLAN / RECOMMENDATIONS:   1) Type 2 Diabetes Mellitus, Poorly controlled, With neuropathic and retinopathic complications - Most recent A1c of 9.5 %. Goal A1c <7.0%.     - Improved glycemic control  - No glucose data  today, will prescribe dexcom  -She does not endorse 1 episode of hypoglycemia that was around 9 PM, she has self increased prandial insulin, I am going to increase her Jardiance and preemptively reduce prandial insulin -Her in office BG 129 mg/DL -Cushing syndrome have been ruled out with a normal 24-hour urine collection -I am going to add correction scale so she can adjust her prandial dose accordingly -Due to her recurrent GI issues I would not switch her DPP 4 inhibitor to GLP-1 agonist as of yet  MEDICATIONS: Increase Jardiance to 25 mg daily Continue Onglyza 5 mg daily Continue Lantus 110 units daily Decrease Humalog to 50 units with each meal Correction scale: BG negative   EDUCATION /  INSTRUCTIONS: BG monitoring instructions: Patient is instructed to check her blood sugars 3 times a day, before meals. Call [facility] if: BG persistently < 70  I reviewed the Rule of 15 for the treatment of hypoglycemia in detail with the patient. Literature supplied.   2) Diabetic complications:  Eye: Does  have known diabetic retinopathy.  Neuro/ Feet: Does  have known diabetic peripheral neuropathy .  Renal: Patient does not have known baseline CKD. She   is  on an ACEI/ARB at present.       F/U in 4 months    Signed electronically by: Mack Guise, MD  [facility]  [address] [place], [place] [address] Phone: [phone] FAX: [phone]   CC: Vevelyn Francois, NP [address] [place] [address] Phone: [phone]  Fax: [phone]  Return to Endocrinology clinic as below: Future Appointments  Date Time Provider [place]  07/01/2021 10:20 AM [facility] NURSE [facility]  07/07/2021 11:00 AM Raulkar, Clide Deutscher, MD [facility] CPR  07/07/2021  2:00 PM Nigel Mormon, MD [facility] None  07/18/2021 11:30 AM Margaretha Seeds, MD [facility] None  07/25/2021  2:35 PM Rever, [place] [facility]  09/23/2021 11:40 AM Ranell Patrick, Clide Deutscher, MD [facility] CPR  10/26/2021 10:50 AM Sherle Mello, Melanie Crazier, MD [facility] None

## 2021-06-28 ENCOUNTER — Telehealth: Payer: Self-pay | Admitting: Pharmacy Technician

## 2021-06-28 ENCOUNTER — Telehealth: Payer: Self-pay | Admitting: Registered Nurse

## 2021-06-28 MED ORDER — HYDROCODONE-ACETAMINOPHEN 10-325 MG PO TABS: 1.0000 | ORAL_TABLET | Freq: Three times a day (TID) | ORAL | 0 refills | Status: DC | PRN

## 2021-06-28 NOTE — Telephone Encounter (Signed)
Patient Advocate Encounter   Received notification from [employer] that prior authorization for [facility] G6 SENSOR is required.   PA submitted on 06/28/2021 Key [id number] Status is pending    [facility] will continue to follow   Montez Morita CPhT Patient Advocate  Endocrinology Clinic Phone: [phone] Fax:  [phone]

## 2021-06-28 NOTE — Telephone Encounter (Signed)
Received notification from [employer] regarding a prior authorization for DEXCOM G6 SENSOR. Authorization has been APPROVED from 06/28/21 to 3/13/.     Authorization # [id number]

## 2021-06-28 NOTE — Telephone Encounter (Signed)
PMP was Reviewed.  Was in contact with Dr Carlis Abbott: see Notes Hydrocodone E-scribed today.Jill Shaw is aware via My Chart Message

## 2021-06-29 ENCOUNTER — Telehealth: Payer: Self-pay | Admitting: Pharmacy Technician

## 2021-06-29 ENCOUNTER — Other Ambulatory Visit (HOSPITAL_COMMUNITY): Payer: Self-pay

## 2021-06-29 NOTE — Telephone Encounter (Signed)
Patient Advocate Encounter   Received notification from [employer] that prior authorization for HUMALOG 200U/mL is required.   PA submitted on 06/29/21 Key [id number] Status is pending    [facility] will continue to follow   Montez Morita, CPhT Patient Advocate [facility] Phone: [phone] Fax:  [phone]

## 2021-06-30 NOTE — Telephone Encounter (Signed)
Received a fax regarding Prior Authorization from [employer] for HUMALOG 200U/ML. Authorization has been DENIED because PATIENT RECENTLY RECEIVED HUMALOG 100U/ML. UNABLE TO FILL BOTH, MUST USE ONE OR THE OTHER.

## 2021-07-01 ENCOUNTER — Other Ambulatory Visit: Payer: Self-pay

## 2021-07-01 ENCOUNTER — Ambulatory Visit (INDEPENDENT_AMBULATORY_CARE_PROVIDER_SITE_OTHER): Payer: Medicaid Other

## 2021-07-01 VITALS — BP 165/89 | HR 103 | Ht 64.0 in | Wt 261.3 lb

## 2021-07-01 DIAGNOSIS — Z3042 Encounter for surveillance of injectable contraceptive: Secondary | ICD-10-CM | POA: Diagnosis not present

## 2021-07-01 DIAGNOSIS — Z23 Encounter for immunization: Secondary | ICD-10-CM

## 2021-07-01 MED ORDER — MEDROXYPROGESTERONE ACETATE 150 MG/ML IM SUSP
150.0000 mg | Freq: Once | INTRAMUSCULAR | Status: AC
  Administered 2021-07-01: 150 mg via INTRAMUSCULAR

## 2021-07-01 NOTE — Patient Instructions (Signed)
[facility]ast center for Mammogram [phone]

## 2021-07-01 NOTE — Progress Notes (Signed)
Patient was assessed and managed by nursing staff during this encounter. I have reviewed the chart and agree with the documentation and plan.   Toriann Spadoni, MSN, CNM, IBCLC 07/01/21 11:58 PM  

## 2021-07-01 NOTE — Progress Notes (Signed)
[name] here for Depo-Provera Injection. Injection administered without complication. Patient will return in 3 months for next injection between Dec 2 and Dec16,2022. Next annual visit due Oct 10,2022 and is scheduled and pt aware.   Pt also aware of elevated BP in office today. Pt is taking BP meds as prescribed and has follow up with PCP and Cardiologist soon. Pt also given number to call the [facility] to set appt for MMG.    Pt also agreeable to have Flu vaccine today. Given, see MAR.  [name], RN 07/01/2021  10:36 AM

## 2021-07-04 DIAGNOSIS — D649 Anemia, unspecified: Secondary | ICD-10-CM | POA: Diagnosis not present

## 2021-07-07 ENCOUNTER — Encounter: Payer: Self-pay | Admitting: Internal Medicine

## 2021-07-07 ENCOUNTER — Other Ambulatory Visit: Payer: Self-pay | Admitting: Nurse Practitioner

## 2021-07-07 ENCOUNTER — Ambulatory Visit: Payer: Medicaid Other | Admitting: Cardiology

## 2021-07-07 ENCOUNTER — Encounter: Payer: Medicaid Other | Admitting: Physical Medicine and Rehabilitation

## 2021-07-07 ENCOUNTER — Encounter: Payer: Self-pay | Admitting: Cardiology

## 2021-07-07 ENCOUNTER — Other Ambulatory Visit: Payer: Self-pay

## 2021-07-07 VITALS — BP 132/76 | HR 107 | Temp 98.6°F | Resp 16 | Ht 64.0 in | Wt 261.0 lb

## 2021-07-07 DIAGNOSIS — L819 Disorder of pigmentation, unspecified: Secondary | ICD-10-CM

## 2021-07-07 DIAGNOSIS — R21 Rash and other nonspecific skin eruption: Secondary | ICD-10-CM

## 2021-07-07 DIAGNOSIS — R079 Chest pain, unspecified: Secondary | ICD-10-CM

## 2021-07-07 DIAGNOSIS — E782 Mixed hyperlipidemia: Secondary | ICD-10-CM | POA: Diagnosis not present

## 2021-07-07 DIAGNOSIS — D649 Anemia, unspecified: Secondary | ICD-10-CM

## 2021-07-07 MED ORDER — ASPIRIN EC 81 MG PO TBEC: 81.0000 mg | DELAYED_RELEASE_TABLET | Freq: Every day | ORAL | 3 refills | Status: DC

## 2021-07-07 MED ORDER — ROSUVASTATIN CALCIUM 20 MG PO TABS: 20.0000 mg | ORAL_TABLET | Freq: Every day | ORAL | 3 refills | Status: DC

## 2021-07-07 MED ORDER — ISOSORBIDE MONONITRATE ER 30 MG PO TB24: 30.0000 mg | ORAL_TABLET | Freq: Every day | ORAL | 3 refills | Status: DC

## 2021-07-07 NOTE — Telephone Encounter (Signed)
From patient.

## 2021-07-07 NOTE — Progress Notes (Signed)
Patient referred by Vevelyn Francois, NP for congestive heart failure  Subjective:   Jill Shaw, female    DOB: 08-17-1976, 45 y.o.   MRN: [id number]   Chief Complaint  Patient presents with   Exertional chest pain   Chronic diastolic CHF (congestive heart failure)    Hypertension   Follow-up     HPI  45 year old African-American female with hypertension, type 2 diabetes mellitus, HFpEF, COPD, tobacco dependence, morbid obesity, microcytic anemia, uterine dysfunction, recurrent syncope  Patient continues to have exertional chest pain and shortness of breath symptoms.  Blood pressure is fairly well controlled.  She was recently hospitalized for nausea vomiting and diarrhea.  She is going to undergo upper endoscopy in the near future.   Current Outpatient Medications on File Prior to Visit  Medication Sig Dispense Refill   ACCU-CHEK GUIDE test strip USE AS DIRECTED UP TO FOUR TIMES DAILY (Patient taking differently: 1 each by Other route in the morning, at noon, in the evening, and at bedtime.) 100 strip 4   Accu-Chek Softclix Lancets lancets USE AS DIRECTED UP TO FOUR TIMES DAILY 100 each 3   albuterol (PROVENTIL) (2.5 MG/3ML) 0.083% nebulizer solution Take 3 mLs (2.5 mg total) by nebulization every 6 (six) hours as needed for wheezing or shortness of breath. 150 mL 6   albuterol (VENTOLIN HFA) 108 (90 Base) MCG/ACT inhaler Inhale 2 puffs into the lungs every 6 (six) hours as needed for wheezing or shortness of breath. 8 g 6   blood glucose meter kit and supplies KIT 1 each by Other route See admin instructions. Dispense based on patient and insurance preference. Use up to four times daily as directed. (FOR ICD-9 250.00, 250.01). 1 each 2   Blood Pressure Monitoring (ADULT BLOOD PRESSURE CUFF LG) KIT 1 kit by Does not apply route daily. 1 kit 0   budesonide-formoterol (SYMBICORT) 160-4.5 MCG/ACT inhaler INHALE 2 PUFFS INTO THE LUNGS 2 (TWO) TIMES DAILY. (Patient taking  differently: Inhale 2 puffs into the lungs 2 (two) times daily.) 10.2 g 6   cetirizine (ZYRTEC) 10 MG tablet Take 1 tablet (10 mg total) by mouth daily. 30 tablet 3   Continuous Blood Gluc Sensor (DEXCOM G6 SENSOR) MISC 1 Device by Does not apply route as directed. 9 each 3   Continuous Blood Gluc Transmit (DEXCOM G6 TRANSMITTER) MISC 1 Device by Does not apply route as directed. 1 each 3   Dexlansoprazole (DEXILANT) 30 MG capsule Take 1 capsule (30 mg total) by mouth daily. 30 capsule 11   dicyclomine (BENTYL) 20 MG tablet Take 1 tablet (20 mg total) by mouth 3 (three) times daily before meals. 90 tablet 11   empagliflozin (JARDIANCE) 25 MG TABS tablet Take 1 tablet (25 mg total) by mouth daily before breakfast. 90 tablet 3   ferrous sulfate 325 (65 FE) MG tablet Take 1 tablet (325 mg total) by mouth 3 (three) times daily with meals. 270 tablet 3   fluticasone (FLONASE) 50 MCG/ACT nasal spray Place 2 sprays into both nostrils daily. 16 g 6   furosemide (LASIX) 40 MG tablet TAKE 1 TABLET(40 MG) BY MOUTH TWICE DAILY (Patient taking differently: Take 40 mg by mouth 2 (two) times daily.) 180 tablet 1   Glucosamine Sulfate 1000 MG CAPS Take 1 capsule (1,000 mg total) by mouth 2 (two) times daily. (Patient taking differently: Take 1,000 mg by mouth 2 (two) times daily.) 180 capsule 3   Heating Pads (HEATING PAD MOIST/DRY KING SZ)  PADS 1 each by Does not apply route 4 (four) times daily as needed. 120 each 1   HYDROcodone-acetaminophen (NORCO) 10-325 MG tablet Take 1 tablet by mouth 3 (three) times daily as needed. Do Not Fill Before 07/01/2021: Change in Sig 90 tablet 0   hydrocortisone 2.5 % cream Apply topically 2 (two) times daily. (Patient taking differently: Apply 1 application topically 2 (two) times daily.) 30 g 11   hydroquinone 4 % cream APPLY TOPICALLY TWICE DAILY (Patient taking differently: Apply 1 application topically 2 (two) times daily as needed (pain).) 28.35 g 0   insulin glargine (LANTUS  SOLOSTAR) 100 UNIT/ML Solostar Pen Inject 110 Units into the skin daily. 105 mL 3   insulin lispro (HUMALOG KWIKPEN) 200 UNIT/ML KwikPen Max daily 180 units 90 mL 3   Insulin Pen Needle 31G X 8 MM MISC 1 Device by Does not apply route in the morning, at noon, in the evening, and at bedtime. 400 each 3   losartan (COZAAR) 50 MG tablet Take 1 tablet (50 mg total) by mouth daily. TAKE 1 TABLET($RemoveBefor'50MG'DyCLsbbatlYh$  TOTAL) BY MOUTH EVERY DAY (Patient taking differently: Take 50 mg by mouth daily.) 90 tablet 3   metoprolol succinate (TOPROL-XL) 100 MG 24 hr tablet Take 100 mg by mouth daily. Take with or immediately following a meal.     nicotine (NICODERM CQ - DOSED IN MG/24 HOURS) 14 mg/24hr patch Place 1 patch (14 mg total) onto the skin daily. 30 patch 0   norethindrone (AYGESTIN) 5 MG tablet Take 3 tablets (15 mg total) by mouth 3 (three) times daily. With bleeding 270 tablet 0   ondansetron (ZOFRAN ODT) 4 MG disintegrating tablet Take 1 tablet (4 mg total) by mouth every 8 (eight) hours as needed for nausea or vomiting. 10 tablet 0   potassium chloride SA (KLOR-CON) 20 MEQ tablet TAKE 1 TABLET(20 MEQ) BY MOUTH DAILY (Patient taking differently: Take 20 mEq by mouth daily.) 30 tablet 6   rosuvastatin (CRESTOR) 5 MG tablet Take 1 tablet (5 mg total) by mouth daily. 90 tablet 3   saxagliptin HCl (ONGLYZA) 5 MG TABS tablet Take 1 tablet (5 mg total) by mouth daily. 90 tablet 3   senna-docusate (SENOKOT-S) 8.6-50 MG tablet Take 1 tablet by mouth at bedtime as needed for mild constipation. 30 tablet 0   spironolactone (ALDACTONE) 50 MG tablet TAKE 1 TABLET(50 MG) BY MOUTH DAILY (Patient taking differently: Take 50 mg by mouth daily.) 30 tablet 3   sucralfate (CARAFATE) 1 GM/10ML suspension Take 10 mLs (1 g total) by mouth 4 (four) times daily. 3600 mL 3   Tiotropium Bromide Monohydrate (SPIRIVA RESPIMAT) 2.5 MCG/ACT AERS Inhale 2 puffs into the lungs daily. 4 g 6   tiZANidine (ZANAFLEX) 4 MG tablet Take 1 tablet (4 mg  total) by mouth every 6 (six) hours as needed for muscle spasms. 120 tablet 3   TOBRADEX ophthalmic solution Place 1 drop into the right eye 4 (four) times daily.     Vitamin D, Ergocalciferol, (DRISDOL) 1.25 MG (50000 UNIT) CAPS capsule Take 1 capsule (50,000 Units total) by mouth every 7 (seven) days for 8 doses. (Patient taking differently: Take 50,000 Units by mouth every Sunday.) 8 capsule 0   vitamin E (VITAMIN E) 180 MG (400 UNITS) capsule Take 1 capsule (400 Units total) by mouth daily. 30 capsule 1   hydrochlorothiazide (MICROZIDE) 12.5 MG capsule Take 1 capsule (12.5 mg total) by mouth daily. 30 capsule 3   No current facility-administered medications  on file prior to visit.    Cardiovascular and other pertinent studies:  Echocardiogram 06/01/2021:  Left ventricle cavity is normal in size. Mild concentric hypertrophy of  the left ventricle. Hyperdynamic LV systolic function >79%. Patient is  tachycardic throughout the day. Indeterminate diastolic filling pattern.  No significant valvular abnormality.  Normal right atrial pressure.  Lexiscan/midified Bruce Tetrofosmin stress test 05/23/2021: Lexiscan/modified Bruce nuclear stress test performed using 1-day protocol. SPECT images show very small sized, mild intensity, inferior apical myocardium with mild reversibility. Stress LVEF calculated 40-45%, although visually appears 50-55%. Low risk study.  EKG 10/14/2020: Sinus tachycardia 111 bpm  Cannot exclude old anteroseptal infarct  EKG 09/01/2020: Sinus rhythm 85 bpm  Poor R wave progression Otherwise normal EKG  Vascular US 07/16/2020: No DVT  Echocardiogram 04/30/2020: 1. Left ventricular ejection fraction, by estimation, is 55 to 60%. The  left ventricle has normal function. The left ventricle has no regional  wall motion abnormalities. There is moderate concentric left ventricular  hypertrophy. Left ventricular  diastolic parameters are consistent with Grade I diastolic  dysfunction  (impaired relaxation). Elevated left ventricular end-diastolic pressure.   2. Right ventricular systolic function is normal. The right ventricular  size is normal.   3. The mitral valve is normal in structure. Trivial mitral valve  regurgitation. No evidence of mitral stenosis.   4. The aortic valve is normal in structure. Aortic valve regurgitation is  not visualized. No aortic stenosis is present.   5. The inferior vena cava is dilated in size with <50% respiratory  variability, suggesting right atrial pressure of 15 mmHg.    Recent labs: 06/14/2021: Chol 158, TG 171, HDL 26, LDL 92  12/26/2020: Glucose 243, BUN/Cr 10/1.17. EGFR 49. Na/K 134/4.6 Trop HS 5,4 H/H 9.8/32.3. MCV 74. Platelets 313 HbA1C 9.7%  07/26/2020: Glucose 160, BUN/Cr 10/1.13. EGFR 60. Na/K 140/4.0. Rest of the CMP normal H/H 9.5/34.7. MCV 70. Platelets 311 HbA1C 9.2% Chol 135, TG 166, HDL 29, LDL 77   Review of Systems  Cardiovascular: Positive for chest pain, dyspnea on exertion, leg swelling and orthopnea. Negative for palpitations and syncope.         Vitals:   07/07/21 1406  BP: 132/76  Pulse: (!) 107  Resp: 16  Temp: 98.6 F (37 C)  SpO2: 95%    Body mass index is 44.8 kg/m. Filed Weights   07/07/21 1406  Weight: 261 lb (118.4 kg)   Review of Systems  Cardiovascular:  Positive for chest pain and dyspnea on exertion. Negative for leg swelling, palpitations and syncope.    Objective:   Physical Exam Physical Exam Vitals and nursing note reviewed.  Constitutional:      General: She is not in acute distress.    Appearance: She is obese.  Neck:     Vascular: No JVD.  Cardiovascular:     Rate and Rhythm: Normal rate and regular rhythm.     Heart sounds: Normal heart sounds. No murmur heard. Pulmonary:     Effort: Pulmonary effort is normal.     Breath sounds: Normal breath sounds. No wheezing or rales.  Musculoskeletal:     Right lower leg: No edema.     Left  lower leg: No edema.          Assessment & Recommendations:   45 year old African-American female with hypertension, type 2 diabetes mellitus, HFpEF, COPD, tobacco dependence, morbid obesity, microcytic anemia, uterine dysfunction, recurrent syncope  Exertional chest pain, dyspnea: Mild ischemia on stress testing, low  risk study. Recommend medical management.  Added aspirin 81 mg daily, increase Crestor to 20 mg daily.  Continue metoprolol, adding Imdur 30 mg daily. If symptoms not better, could consider coronary angiography in the future.  Chronic heart failure with preserved ejection fraction (HCC) Currently euvolumic Likely related to hypertension, obesity, controlled type 2 diabetes mellitus.  Other differential for her dyspnea is microcytic anemia.  Defer management to PCP.  Continue current medical management, low salt diet  Hypertension Controlled   Controlled type 2 diabetes mellitus: Needs aggressive management. Defer to PCP.  F/u in 4-6 weeks   Nigel Mormon, MD Pager: [phone] Office: [phone]

## 2021-07-08 NOTE — Telephone Encounter (Signed)
Called the patient and left a voicemail. If she does not have the medications I prescribed, will need refills send to pharmacy.  07/08/21

## 2021-07-09 DIAGNOSIS — R531 Weakness: Secondary | ICD-10-CM | POA: Diagnosis not present

## 2021-07-09 DIAGNOSIS — R42 Dizziness and giddiness: Secondary | ICD-10-CM | POA: Diagnosis not present

## 2021-07-11 ENCOUNTER — Telehealth: Payer: Self-pay

## 2021-07-11 NOTE — Telephone Encounter (Signed)
Patient called c/o dizziness and passing out. I added her to your schedule this Friday

## 2021-07-11 NOTE — Telephone Encounter (Signed)
Ok

## 2021-07-12 DIAGNOSIS — Z7409 Other reduced mobility: Secondary | ICD-10-CM | POA: Diagnosis not present

## 2021-07-12 DIAGNOSIS — G5603 Carpal tunnel syndrome, bilateral upper limbs: Secondary | ICD-10-CM | POA: Diagnosis not present

## 2021-07-12 DIAGNOSIS — G8929 Other chronic pain: Secondary | ICD-10-CM | POA: Diagnosis not present

## 2021-07-12 DIAGNOSIS — I10 Essential (primary) hypertension: Secondary | ICD-10-CM | POA: Diagnosis not present

## 2021-07-12 DIAGNOSIS — M1711 Unilateral primary osteoarthritis, right knee: Secondary | ICD-10-CM | POA: Diagnosis not present

## 2021-07-14 ENCOUNTER — Encounter: Payer: Self-pay | Admitting: Internal Medicine

## 2021-07-14 ENCOUNTER — Telehealth: Payer: Self-pay

## 2021-07-14 ENCOUNTER — Ambulatory Visit (HOSPITAL_COMMUNITY): Payer: Medicaid Other

## 2021-07-14 NOTE — Telephone Encounter (Signed)
It looks like the 200 Humalog was denied because you had just pick up the 100mg . You have to be on one or the other. I will send to provider and see what she wants you to do.

## 2021-07-14 NOTE — Telephone Encounter (Signed)
Received a fax regarding Prior Authorization from [employer] for HUMALOG 200U/ML. Authorization has been DENIED because PATIENT RECENTLY RECEIVED HUMALOG 100U/ML. UNABLE TO FILL BOTH, MUST USE ONE OR THE OTHER. Please advise

## 2021-07-15 ENCOUNTER — Ambulatory Visit: Payer: Medicaid Other | Admitting: Cardiology

## 2021-07-15 ENCOUNTER — Telehealth: Payer: Self-pay

## 2021-07-15 ENCOUNTER — Encounter: Payer: Self-pay | Admitting: Cardiology

## 2021-07-15 ENCOUNTER — Other Ambulatory Visit: Payer: Self-pay

## 2021-07-15 VITALS — BP 154/73 | HR 108 | Temp 98.5°F | Resp 17 | Ht 64.0 in | Wt 271.6 lb

## 2021-07-15 DIAGNOSIS — R079 Chest pain, unspecified: Secondary | ICD-10-CM | POA: Diagnosis not present

## 2021-07-15 DIAGNOSIS — R0609 Other forms of dyspnea: Secondary | ICD-10-CM

## 2021-07-15 DIAGNOSIS — I10 Essential (primary) hypertension: Secondary | ICD-10-CM

## 2021-07-15 MED ORDER — LOSARTAN POTASSIUM 100 MG PO TABS: 100.0000 mg | ORAL_TABLET | Freq: Every day | ORAL | 3 refills | Status: DC

## 2021-07-15 NOTE — Telephone Encounter (Signed)
Patient was changed to the Humalog 200 unit. Pharmacy states that the Humalog 100 units was last picked up in July. Pharmacy states that 200 mg still needs PA. Are we able to submit a new PA showing that patient was changed to 200 .

## 2021-07-15 NOTE — Progress Notes (Signed)
Patient referred by Vevelyn Francois, NP for congestive heart failure  Subjective:   Jill Shaw, female    DOB: 02/07/1976, 45 y.o.   MRN: [id number]   Chief Complaint  Patient presents with   Dizziness     HPI  45 year old African-American female with hypertension, type 2 diabetes mellitus, HFpEF, COPD, tobacco dependence, morbid obesity, microcytic anemia, uterine dysfunction  Patient made today's appointment stating that she "passed out".  Patient is very stressed and tearful today.  On more specific questioning, she states that she came home from work and sat on the couch.  Her mother found her "snoring".  On further questioning whether she simply fell asleep or lost consciousness, she states "I guess as slept off".  She states that her chest pain is mild.  Blood pressure still remains elevated.   Current Outpatient Medications on File Prior to Visit  Medication Sig Dispense Refill   ACCU-CHEK GUIDE test strip USE AS DIRECTED UP TO FOUR TIMES DAILY (Patient taking differently: 1 each by Other route in the morning, at noon, in the evening, and at bedtime.) 100 strip 4   Accu-Chek Softclix Lancets lancets USE AS DIRECTED UP TO FOUR TIMES DAILY 100 each 3   albuterol (PROVENTIL) (2.5 MG/3ML) 0.083% nebulizer solution Take 3 mLs (2.5 mg total) by nebulization every 6 (six) hours as needed for wheezing or shortness of breath. 150 mL 6   albuterol (VENTOLIN HFA) 108 (90 Base) MCG/ACT inhaler Inhale 2 puffs into the lungs every 6 (six) hours as needed for wheezing or shortness of breath. 8 g 6   aspirin EC 81 MG tablet Take 1 tablet (81 mg total) by mouth daily. 90 tablet 3   blood glucose meter kit and supplies KIT 1 each by Other route See admin instructions. Dispense based on patient and insurance preference. Use up to four times daily as directed. (FOR ICD-9 250.00, 250.01). 1 each 2   Blood Pressure Monitoring (ADULT BLOOD PRESSURE CUFF LG) KIT 1 kit by Does not apply route  daily. 1 kit 0   budesonide-formoterol (SYMBICORT) 160-4.5 MCG/ACT inhaler INHALE 2 PUFFS INTO THE LUNGS 2 (TWO) TIMES DAILY. (Patient taking differently: Inhale 2 puffs into the lungs 2 (two) times daily.) 10.2 g 6   cetirizine (ZYRTEC) 10 MG tablet Take 1 tablet (10 mg total) by mouth daily. 30 tablet 3   Continuous Blood Gluc Sensor (DEXCOM G6 SENSOR) MISC 1 Device by Does not apply route as directed. 9 each 3   Continuous Blood Gluc Transmit (DEXCOM G6 TRANSMITTER) MISC 1 Device by Does not apply route as directed. 1 each 3   Dexlansoprazole (DEXILANT) 30 MG capsule Take 1 capsule (30 mg total) by mouth daily. 30 capsule 11   dicyclomine (BENTYL) 20 MG tablet Take 1 tablet (20 mg total) by mouth 3 (three) times daily before meals. 90 tablet 11   empagliflozin (JARDIANCE) 25 MG TABS tablet Take 1 tablet (25 mg total) by mouth daily before breakfast. 90 tablet 3   ferrous sulfate 325 (65 FE) MG tablet Take 1 tablet (325 mg total) by mouth 3 (three) times daily with meals. 270 tablet 3   fluticasone (FLONASE) 50 MCG/ACT nasal spray Place 2 sprays into both nostrils daily. 16 g 6   furosemide (LASIX) 40 MG tablet TAKE 1 TABLET(40 MG) BY MOUTH TWICE DAILY (Patient taking differently: Take 40 mg by mouth 2 (two) times daily.) 180 tablet 1   Glucosamine Sulfate 1000 MG CAPS Take 1  capsule (1,000 mg total) by mouth 2 (two) times daily. (Patient taking differently: Take 1,000 mg by mouth 2 (two) times daily.) 180 capsule 3   Heating Pads (HEATING PAD MOIST/DRY KING SZ) PADS 1 each by Does not apply route 4 (four) times daily as needed. 120 each 1   HYDROcodone-acetaminophen (NORCO) 10-325 MG tablet Take 1 tablet by mouth 3 (three) times daily as needed. Do Not Fill Before 07/01/2021: Change in Sig 90 tablet 0   hydrocortisone 2.5 % cream Apply topically 2 (two) times daily. (Patient taking differently: Apply 1 application topically 2 (two) times daily.) 30 g 11   hydroquinone 4 % cream APPLY TOPICALLY  TWICE DAILY (Patient taking differently: Apply 1 application topically 2 (two) times daily as needed (pain).) 28.35 g 0   insulin glargine (LANTUS SOLOSTAR) 100 UNIT/ML Solostar Pen Inject 110 Units into the skin daily. 105 mL 3   insulin lispro (HUMALOG KWIKPEN) 200 UNIT/ML KwikPen Max daily 180 units 90 mL 3   Insulin Pen Needle 31G X 8 MM MISC 1 Device by Does not apply route in the morning, at noon, in the evening, and at bedtime. 400 each 3   isosorbide mononitrate (IMDUR) 30 MG 24 hr tablet Take 1 tablet (30 mg total) by mouth daily. 90 tablet 3   losartan (COZAAR) 50 MG tablet Take 1 tablet (50 mg total) by mouth daily. TAKE 1 TABLET(50MG TOTAL) BY MOUTH EVERY DAY (Patient taking differently: Take 50 mg by mouth daily.) 90 tablet 3   metoprolol succinate (TOPROL-XL) 100 MG 24 hr tablet Take 100 mg by mouth daily. Take with or immediately following a meal.     norethindrone (AYGESTIN) 5 MG tablet Take 3 tablets (15 mg total) by mouth 3 (three) times daily. With bleeding 270 tablet 0   ondansetron (ZOFRAN ODT) 4 MG disintegrating tablet Take 1 tablet (4 mg total) by mouth every 8 (eight) hours as needed for nausea or vomiting. 10 tablet 0   potassium chloride SA (KLOR-CON) 20 MEQ tablet TAKE 1 TABLET(20 MEQ) BY MOUTH DAILY (Patient taking differently: Take 20 mEq by mouth daily.) 30 tablet 6   rosuvastatin (CRESTOR) 20 MG tablet Take 1 tablet (20 mg total) by mouth daily. 30 tablet 3   saxagliptin HCl (ONGLYZA) 5 MG TABS tablet Take 1 tablet (5 mg total) by mouth daily. 90 tablet 3   senna-docusate (SENOKOT-S) 8.6-50 MG tablet Take 1 tablet by mouth at bedtime as needed for mild constipation. 30 tablet 0   spironolactone (ALDACTONE) 50 MG tablet TAKE 1 TABLET(50 MG) BY MOUTH DAILY (Patient taking differently: Take 50 mg by mouth daily.) 30 tablet 3   sucralfate (CARAFATE) 1 GM/10ML suspension Take 10 mLs (1 g total) by mouth 4 (four) times daily. 3600 mL 3   Tiotropium Bromide Monohydrate  (SPIRIVA RESPIMAT) 2.5 MCG/ACT AERS Inhale 2 puffs into the lungs daily. 4 g 6   tiZANidine (ZANAFLEX) 4 MG tablet Take 1 tablet (4 mg total) by mouth every 6 (six) hours as needed for muscle spasms. 120 tablet 3   TOBRADEX ophthalmic solution Place 1 drop into the right eye 4 (four) times daily.     Vitamin D, Ergocalciferol, (DRISDOL) 1.25 MG (50000 UNIT) CAPS capsule Take 1 capsule (50,000 Units total) by mouth every 7 (seven) days for 8 doses. (Patient taking differently: Take 50,000 Units by mouth every Sunday.) 8 capsule 0   vitamin E (VITAMIN E) 180 MG (400 UNITS) capsule Take 1 capsule (400 Units total) by  mouth daily. 30 capsule 1   hydrochlorothiazide (MICROZIDE) 12.5 MG capsule Take 1 capsule (12.5 mg total) by mouth daily. 30 capsule 3   No current facility-administered medications on file prior to visit.    Cardiovascular and other pertinent studies:  Echocardiogram 06/01/2021:  Left ventricle cavity is normal in size. Mild concentric hypertrophy of  the left ventricle. Hyperdynamic LV systolic function >42%. Patient is  tachycardic throughout the day. Indeterminate diastolic filling pattern.  No significant valvular abnormality.  Normal right atrial pressure.  Lexiscan/midified Bruce Tetrofosmin stress test 05/23/2021: Lexiscan/modified Bruce nuclear stress test performed using 1-day protocol. SPECT images show very small sized, mild intensity, inferior apical myocardium with mild reversibility. Stress LVEF calculated 40-45%, although visually appears 50-55%. Low risk study.  EKG 10/14/2020: Sinus tachycardia 111 bpm  Cannot exclude old anteroseptal infarct  EKG 09/01/2020: Sinus rhythm 85 bpm  Poor R wave progression Otherwise normal EKG  Vascular US 07/16/2020: No DVT  Echocardiogram 04/30/2020: 1. Left ventricular ejection fraction, by estimation, is 55 to 60%. The  left ventricle has normal function. The left ventricle has no regional  wall motion abnormalities.  There is moderate concentric left ventricular  hypertrophy. Left ventricular  diastolic parameters are consistent with Grade I diastolic dysfunction  (impaired relaxation). Elevated left ventricular end-diastolic pressure.   2. Right ventricular systolic function is normal. The right ventricular  size is normal.   3. The mitral valve is normal in structure. Trivial mitral valve  regurgitation. No evidence of mitral stenosis.   4. The aortic valve is normal in structure. Aortic valve regurgitation is  not visualized. No aortic stenosis is present.   5. The inferior vena cava is dilated in size with <50% respiratory  variability, suggesting right atrial pressure of 15 mmHg.    Recent labs: 06/14/2021: Chol 158, TG 171, HDL 26, LDL 92  12/26/2020: Glucose 243, BUN/Cr 10/1.17. EGFR 49. Na/K 134/4.6 Trop HS 5,4 H/H 9.8/32.3. MCV 74. Platelets 313 HbA1C 9.7%  07/26/2020: Glucose 160, BUN/Cr 10/1.13. EGFR 60. Na/K 140/4.0. Rest of the CMP normal H/H 9.5/34.7. MCV 70. Platelets 311 HbA1C 9.2% Chol 135, TG 166, HDL 29, LDL 77   Review of Systems  Cardiovascular: Positive for chest pain, dyspnea on exertion, leg swelling and orthopnea. Negative for palpitations and syncope.         Vitals:   07/15/21 1047 07/15/21 1103  BP: (!) 154/73   Pulse: (!) 108   Resp: 17   Temp: 98.5 F (36.9 C)   SpO2: 99% 99%   Orthostatic VS for the past 72 hrs (Last 3 readings):  Orthostatic BP Patient Position BP Location Cuff Size Orthostatic Pulse  07/15/21 1107 145/85 Standing Left Arm Large 98  07/15/21 1106 156/83 Sitting Left Arm Large 100  07/15/21 1103 166/79 Supine Left Arm Normal 103     Body mass index is 46.62 kg/m. Filed Weights   07/15/21 1047  Weight: 271 lb 9.6 oz (123.2 kg)   Review of Systems  Cardiovascular:  Positive for chest pain and dyspnea on exertion. Negative for leg swelling, palpitations and syncope.  Psychiatric/Behavioral:         Tearful    Objective:    Physical Exam Physical Exam Vitals and nursing note reviewed.  Constitutional:      General: She is not in acute distress.    Appearance: She is obese.  Neck:     Vascular: No JVD.  Cardiovascular:     Rate and Rhythm: Normal rate and regular  rhythm.     Heart sounds: Normal heart sounds. No murmur heard. Pulmonary:     Effort: Pulmonary effort is normal.     Breath sounds: Normal breath sounds. No wheezing or rales.  Musculoskeletal:     Right lower leg: No edema.     Left lower leg: No edema.          Assessment & Recommendations:   45 year old African-American female with hypertension, type 2 diabetes mellitus, HFpEF, COPD, tobacco dependence, morbid obesity, microcytic anemia, uterine dysfunction, recurrent syncope  Exertional chest pain, dyspnea: Mild ischemia on stress testing, low risk study. Recommend medical management.   Continue aspirin, statin, metoprolol, Imdur.   Aggressive hypertension management.    Hypertension: No clear syncope.  Orthostatics are negative.  Dizziness likely related to uncontrolled hypertension. Increase losartan to 100 mg daily. Check BMP in 1 week.  Chronic heart failure with preserved ejection fraction (HCC) Currently euvolumic Likely related to hypertension, obesity, controlled type 2 diabetes mellitus.  Other differential for her dyspnea is microcytic anemia.  Defer management to PCP.  Continue current medical management, low salt diet  Controlled type 2 diabetes mellitus: Needs aggressive management. Defer to PCP.  F/u in 4 weeks   Nigel Mormon, MD Pager: [phone] Office: [phone]

## 2021-07-15 NOTE — Telephone Encounter (Signed)
Patient notified and WG getting script ready for patient

## 2021-07-15 NOTE — Telephone Encounter (Signed)
PA was approved  for Humalog 200u/ml  Authorization [id number]

## 2021-07-15 NOTE — Telephone Encounter (Signed)
So you were able to get the insulin now correct or do you still need it

## 2021-07-15 NOTE — Telephone Encounter (Signed)
[facility] Dept calling WITH pt on the phone. Pt WAS on 100U/MG and was increased to 200U/MG insulin lispro (HUMALOG KWIKPEN).   PT HAS ONE PEN LEFT.   [facility] PA Dept phone#: [phone]  Pt pharmacy [facility] [address]

## 2021-07-18 ENCOUNTER — Other Ambulatory Visit: Payer: Self-pay

## 2021-07-18 ENCOUNTER — Encounter: Payer: Self-pay | Admitting: Pulmonary Disease

## 2021-07-18 ENCOUNTER — Encounter (HOSPITAL_COMMUNITY): Payer: Self-pay | Admitting: *Deleted

## 2021-07-18 ENCOUNTER — Ambulatory Visit (INDEPENDENT_AMBULATORY_CARE_PROVIDER_SITE_OTHER): Payer: Medicaid Other

## 2021-07-18 ENCOUNTER — Ambulatory Visit (INDEPENDENT_AMBULATORY_CARE_PROVIDER_SITE_OTHER): Payer: Medicaid Other | Admitting: Pulmonary Disease

## 2021-07-18 VITALS — BP 124/78 | HR 117 | Temp 99.3°F | Ht 64.0 in | Wt 267.8 lb

## 2021-07-18 DIAGNOSIS — J449 Chronic obstructive pulmonary disease, unspecified: Secondary | ICD-10-CM

## 2021-07-18 DIAGNOSIS — J42 Unspecified chronic bronchitis: Secondary | ICD-10-CM | POA: Diagnosis not present

## 2021-07-18 DIAGNOSIS — G4700 Insomnia, unspecified: Secondary | ICD-10-CM

## 2021-07-18 DIAGNOSIS — R0602 Shortness of breath: Secondary | ICD-10-CM

## 2021-07-18 MED ORDER — BUDESONIDE-FORMOTEROL FUMARATE 160-4.5 MCG/ACT IN AERO: INHALATION_SPRAY | RESPIRATORY_TRACT | 6 refills | Status: DC

## 2021-07-18 MED ORDER — SPIRIVA RESPIMAT 2.5 MCG/ACT IN AERS: 2.0000 | INHALATION_SPRAY | Freq: Every day | RESPIRATORY_TRACT | 6 refills | Status: DC

## 2021-07-18 MED ORDER — ALBUTEROL SULFATE (2.5 MG/3ML) 0.083% IN NEBU: 2.5000 mg | INHALATION_SOLUTION | Freq: Four times a day (QID) | RESPIRATORY_TRACT | 6 refills | Status: DC | PRN

## 2021-07-18 MED ORDER — ALBUTEROL SULFATE HFA 108 (90 BASE) MCG/ACT IN AERS: 2.0000 | INHALATION_SPRAY | Freq: Four times a day (QID) | RESPIRATORY_TRACT | 6 refills | Status: DC | PRN

## 2021-07-18 NOTE — Patient Instructions (Signed)
Shortness of breath --CXR today  Moderate COPD/asthma (FEV1 61%) GOLD Class B/D --CONTINUE Symbicort TWO puffs TWICE a day. REFILL --CONTINUE Spiriva TWO puffs ONCE a day. REFILL --CONTINUE Albuterol handheld AS NEEDED. REFILL --CONTINUE Albuterol nebulizer AS NEEDED. REFILL --REFER to pulmonary rehab. Patient has [facility] IV symptoms.  Insomnia --Refer to sleep clinic --ORDER split night sleep study

## 2021-07-18 NOTE — Progress Notes (Signed)
Received referral from Dr. Everardo All for this pt to participate in pulmonary rehab with the diagnosis of COPD Stage 2.  Pt with full PFT 09/2019 showed FEV1FVC 66 and post pred FEV1 61. Clinical review of pt follow up appt on 10/3 Pulmonary office note.  Also reviewed follow up appt with Dr. Rosemary Holms - Cardiology and Dr. Lonzo Cloud - endocrinology. Pt with Covid Risk Score - 5. Pt appropriate for scheduling for Pulmonary rehab.  Will forward to support staff for scheduling  when able as there is a wait list and verification of insurance eligibility/benefits with pt consent. Alanson Aly, BSN Cardiac and Emergency planning/management officer

## 2021-07-18 NOTE — Progress Notes (Signed)
Subjective:   PATIENT ID: Jill Shaw GENDER: female DOB: 13-Jul-1976, MRN: [id number]   HPI  Chief Complaint  Patient presents with   Consult    SOB has worsened, wheezing and coughing over a couple months. SOB during exertion, when patient lays down has to use around 7 pillows if not she cannot breathe. SOB even when talking.    Reason for Visit: New patient  [name] is a 45 year old female former smoker with COPD, asthma, chronic diastolic heart failure who presents for shortness of breath.  She is short of breath with walking only a few steps. Washing dishes can also be difficult. She is unable to walk up to steps. Talking can sometimes difficult. She reports >10 lb weight gain in the last week. She is taking lasix. She has wheezing and coughing. Endorses orthopnea. Denies recent steroids and states she does not take steroids in general due to nausea, wheezing. She is compliance with Symbicort and Spiriva. She needs albuterol 3-4 times a day and is using her nebulizer three times a day. Denies fevers, chills.  She reports poor sleep schedule. She has difficulty falling asleep. Sometimes has insomnia. When she does sleep, she has snoring and nighttime awakenings  Asthma Control Test ACT Total Score  07/18/2021 4   Social History: Previously worked in Nurse, children's Quit smoking March 2022. Smokes 1/2 ppd x 20 years. No vaping  I have personally reviewed patient's past medical/family/social history, allergies, current medications.  Past Medical History:  Diagnosis Date   Anemia    Arthritis    knees, hands   Asthma    Chronic diastolic (congestive) heart failure (HCC)    COPD (chronic obstructive pulmonary disease) (HCC)    Diabetes mellitus without complication (HCC)    type 2   Dysfunctional uterine bleeding    GERD (gastroesophageal reflux disease)    Hypertension    Neuromuscular disorder (HCC)    neuropathy feet   Seizures  ([place]) 09/12/2017   pt states r/t stress and blood sugar - no meds last one 4 months ago, not seen neurologist   Sickle cell trait ([place])    Smoker    Vitamin D deficiency 10/2019   Wears glasses      Family History  Problem Relation Age of Onset   Diabetes Mother    Hypertension Mother      Social History   Occupational History   Occupation: unemployed  Tobacco Use   Smoking status: Some Days    Packs/day: 0.25    Years: 26.00    Pack years: 6.50    Types: Cigarettes    Last attempt to quit: 09/13/2020    Years since quitting: 0.8   Smokeless tobacco: Never   Tobacco comments:    4-5 cigarettes/day  Vaping Use   Vaping Use: Never used  Substance and Sexual Activity   Alcohol use: No   Drug use: No   Sexual activity: Not Currently    Birth control/protection: None    Allergies  Allergen Reactions   Amitriptyline Other (See Comments)    Coma   Ketoprofen Nausea And Vomiting   Trazodone Nausea And Vomiting   Aspirin Nausea Only   Gabapentin Nausea And Vomiting and Other (See Comments)    upset stomach   Ibuprofen Nausea And Vomiting   Liraglutide Nausea And Vomiting   Naproxen Nausea And Vomiting   Omeprazole-Sodium Bicarbonate Nausea And Vomiting   Sulfa Antibiotics Nausea And Vomiting  Tramadol Nausea And Vomiting and Other (See Comments)    stomach upset     Outpatient Medications Prior to Visit  Medication Sig Dispense Refill   ACCU-CHEK GUIDE test strip USE AS DIRECTED UP TO FOUR TIMES DAILY (Patient taking differently: 1 each by Other route in the morning, at noon, in the evening, and at bedtime.) 100 strip 4   Accu-Chek Softclix Lancets lancets USE AS DIRECTED UP TO FOUR TIMES DAILY 100 each 3   albuterol (PROVENTIL) (2.5 MG/3ML) 0.083% nebulizer solution Take 3 mLs (2.5 mg total) by nebulization every 6 (six) hours as needed for wheezing or shortness of breath. 150 mL 6   albuterol (VENTOLIN HFA) 108 (90 Base) MCG/ACT inhaler Inhale 2 puffs into the  lungs every 6 (six) hours as needed for wheezing or shortness of breath. 8 g 6   aspirin EC 81 MG tablet Take 1 tablet (81 mg total) by mouth daily. 90 tablet 3   blood glucose meter kit and supplies KIT 1 each by Other route See admin instructions. Dispense based on patient and insurance preference. Use up to four times daily as directed. (FOR ICD-9 250.00, 250.01). 1 each 2   Blood Pressure Monitoring (ADULT BLOOD PRESSURE CUFF LG) KIT 1 kit by Does not apply route daily. 1 kit 0   budesonide-formoterol (SYMBICORT) 160-4.5 MCG/ACT inhaler INHALE 2 PUFFS INTO THE LUNGS 2 (TWO) TIMES DAILY. (Patient taking differently: Inhale 2 puffs into the lungs 2 (two) times daily.) 10.2 g 6   cetirizine (ZYRTEC) 10 MG tablet Take 1 tablet (10 mg total) by mouth daily. 30 tablet 3   Continuous Blood Gluc Sensor (DEXCOM G6 SENSOR) MISC 1 Device by Does not apply route as directed. 9 each 3   Continuous Blood Gluc Transmit (DEXCOM G6 TRANSMITTER) MISC 1 Device by Does not apply route as directed. 1 each 3   Dexlansoprazole (DEXILANT) 30 MG capsule Take 1 capsule (30 mg total) by mouth daily. 30 capsule 11   dicyclomine (BENTYL) 20 MG tablet Take 1 tablet (20 mg total) by mouth 3 (three) times daily before meals. 90 tablet 11   empagliflozin (JARDIANCE) 25 MG TABS tablet Take 1 tablet (25 mg total) by mouth daily before breakfast. 90 tablet 3   ferrous sulfate 325 (65 FE) MG tablet Take 1 tablet (325 mg total) by mouth 3 (three) times daily with meals. 270 tablet 3   fluticasone (FLONASE) 50 MCG/ACT nasal spray Place 2 sprays into both nostrils daily. 16 g 6   furosemide (LASIX) 40 MG tablet TAKE 1 TABLET(40 MG) BY MOUTH TWICE DAILY (Patient taking differently: Take 40 mg by mouth 2 (two) times daily.) 180 tablet 1   Glucosamine Sulfate 1000 MG CAPS Take 1 capsule (1,000 mg total) by mouth 2 (two) times daily. (Patient taking differently: Take 1,000 mg by mouth 2 (two) times daily.) 180 capsule 3   Heating Pads  (HEATING PAD MOIST/DRY KING SZ) PADS 1 each by Does not apply route 4 (four) times daily as needed. 120 each 1   HYDROcodone-acetaminophen (NORCO) 10-325 MG tablet Take 1 tablet by mouth 3 (three) times daily as needed. Do Not Fill Before 07/01/2021: Change in Sig 90 tablet 0   hydrocortisone 2.5 % cream Apply topically 2 (two) times daily. (Patient taking differently: Apply 1 application topically 2 (two) times daily.) 30 g 11   hydroquinone 4 % cream APPLY TOPICALLY TWICE DAILY (Patient taking differently: Apply 1 application topically 2 (two) times daily as needed (pain).) 28.35  g 0   insulin glargine (LANTUS SOLOSTAR) 100 UNIT/ML Solostar Pen Inject 110 Units into the skin daily. 105 mL 3   insulin lispro (HUMALOG KWIKPEN) 200 UNIT/ML KwikPen Max daily 180 units 90 mL 3   Insulin Pen Needle 31G X 8 MM MISC 1 Device by Does not apply route in the morning, at noon, in the evening, and at bedtime. 400 each 3   isosorbide mononitrate (IMDUR) 30 MG 24 hr tablet Take 1 tablet (30 mg total) by mouth daily. 90 tablet 3   losartan (COZAAR) 100 MG tablet Take 1 tablet (100 mg total) by mouth daily. TAKE 1 TABLET($RemoveBefor'50MG'DcIMZkIYxSYU$  TOTAL) BY MOUTH EVERY DAY 30 tablet 3   metoprolol succinate (TOPROL-XL) 100 MG 24 hr tablet Take 100 mg by mouth daily. Take with or immediately following a meal.     norethindrone (AYGESTIN) 5 MG tablet Take 3 tablets (15 mg total) by mouth 3 (three) times daily. With bleeding 270 tablet 0   ondansetron (ZOFRAN ODT) 4 MG disintegrating tablet Take 1 tablet (4 mg total) by mouth every 8 (eight) hours as needed for nausea or vomiting. 10 tablet 0   potassium chloride SA (KLOR-CON) 20 MEQ tablet TAKE 1 TABLET(20 MEQ) BY MOUTH DAILY (Patient taking differently: Take 20 mEq by mouth daily.) 30 tablet 6   rosuvastatin (CRESTOR) 20 MG tablet Take 1 tablet (20 mg total) by mouth daily. 30 tablet 3   saxagliptin HCl (ONGLYZA) 5 MG TABS tablet Take 1 tablet (5 mg total) by mouth daily. 90 tablet 3    senna-docusate (SENOKOT-S) 8.6-50 MG tablet Take 1 tablet by mouth at bedtime as needed for mild constipation. 30 tablet 0   spironolactone (ALDACTONE) 50 MG tablet TAKE 1 TABLET(50 MG) BY MOUTH DAILY (Patient taking differently: Take 50 mg by mouth daily.) 30 tablet 3   sucralfate (CARAFATE) 1 GM/10ML suspension Take 10 mLs (1 g total) by mouth 4 (four) times daily. 3600 mL 3   Tiotropium Bromide Monohydrate (SPIRIVA RESPIMAT) 2.5 MCG/ACT AERS Inhale 2 puffs into the lungs daily. 4 g 6   tiZANidine (ZANAFLEX) 4 MG tablet Take 1 tablet (4 mg total) by mouth every 6 (six) hours as needed for muscle spasms. 120 tablet 3   TOBRADEX ophthalmic solution Place 1 drop into the right eye 4 (four) times daily.     Vitamin D, Ergocalciferol, (DRISDOL) 1.25 MG (50000 UNIT) CAPS capsule Take 1 capsule (50,000 Units total) by mouth every 7 (seven) days for 8 doses. (Patient taking differently: Take 50,000 Units by mouth every Sunday.) 8 capsule 0   vitamin E (VITAMIN E) 180 MG (400 UNITS) capsule Take 1 capsule (400 Units total) by mouth daily. 30 capsule 1   hydrochlorothiazide (MICROZIDE) 12.5 MG capsule Take 1 capsule (12.5 mg total) by mouth daily. 30 capsule 3   No facility-administered medications prior to visit.    Review of Systems  Constitutional:  Positive for malaise/fatigue. Negative for chills, diaphoresis, fever and weight loss.  HENT:  Negative for congestion.   Respiratory:  Positive for shortness of breath. Negative for cough, hemoptysis, sputum production and wheezing.   Cardiovascular:  Negative for chest pain, palpitations and leg swelling.    Objective:   Vitals:   07/18/21 1148  BP: 124/78  Pulse: (!) 117  Temp: 99.3 F (37.4 C)  TempSrc: Oral  SpO2: 100%  Weight: 267 lb 12.8 oz (121.5 kg)  Height: $Remove'5\' 4"'oAvPwoE$  (1.626 m)   SpO2: 100 % O2 Device: None (Room air)  Body mass index is 45.97 kg/m.  Physical Exam: General: Well-appearing, no acute distress HENT: [place], AT Eyes:  EOMI, no scleral icterus Respiratory: Clear to auscultation bilaterally.  No crackles, wheezing or rales Cardiovascular: RRR, -M/R/G, no JVD Extremities:-Edema,-tenderness Neuro: AAO x4, CNII-XII grossly intact Psych: Normal mood, normal affect  Data Reviewed:  PFT: FVC (L) 2.31 2.43 -1.92 72 2.41 76 0.10 +4 FEV1 (L) 1.78 1.96 -2.13 68 1.58 61 -0.20 -11 FEV1/FVC (%) 77 72 -1 92 66 78 -14 Expiratory Time (sec) 5.85 6.65 +13 TestGrade(ATS) AA AA ---- Slow Vital Capacity --- SVC (L) 2.52 2.43 -1.45 79 IC (L) 2.07 89 ERV (L) 0.45 52 FEV1/SVC (%) 71 86 ---- LUNG VOLUMES ---- TLC ([place]) (L) 4.35 4.13 -1.60 83 TGV (L) 2.28 1.85 -1.17 78 RV ([place]) (L) 1.83 0.91 0.41 109 RV/TLC ([place]) (%) 42 21 2 132 ---- DIFFUSION ---- DLCOunc (ml/min/mmHg) 19.24 17.40 -0.98 85 DLCOcor (ml/min/mmHg) 17.40 VA (L) 3.63 4.19 -2.77 70 TLC (SB) (L) 3.78 Kco (ml/min/mmHg/L) 5.30 3.46 1.33 119 DLCO ATS GRADES AA     Assessment & Plan:   Discussion: 45 year old female with morbid obesity, COPD/asthma overlap and chronic diastolic heart failure who presents as a new patient to me. She is seen by Dr. Valeta Harms. Reports shortness of breath which is multifactorial in setting deconditioning, suspected sleep apnea and asthma/COPD. Vital signs reviewed and euvolemic on exam today.   Shortness of breath --CXR today  Moderate persistent asthma/COPD (FEV1 61%) GOLD Class B/D --CONTINUE Symbicort TWO puffs TWICE a day. REFILL --CONTINUE Spiriva TWO puffs ONCE a day. REFILL --CONTINUE Albuterol handheld AS NEEDED. REFILL --CONTINUE Albuterol nebulizer AS NEEDED. REFILL --REFER to pulmonary rehab. Patient has [facility] IV symptoms.  Insomnia --Refer to sleep clinic --ORDER split night sleep study  Health Maintenance Immunization History  Administered Date(s) Administered   Influenza,inj,Quad PF,6+ Mos 09/03/2018, 07/03/2019, 07/04/2020, 07/01/2021   Pneumococcal Polysaccharide-23 03/28/2015, 07/04/2020   Tdap  03/27/2016   Unspecified SARS-COV-2 Vaccination 03/25/2020, 05/05/2020   CT Lung Screen - Not qualified  Orders Placed This Encounter  Procedures   DG Chest 2 View    Standing Status:   Future    Number of Occurrences:   1    Standing Expiration Date:   07/18/2022    Order Specific Question:   Reason for Exam (SYMPTOM  OR DIAGNOSIS REQUIRED)    Answer:   F/U on SOB    Order Specific Question:   Is patient pregnant?    Answer:   No    Order Specific Question:   Preferred imaging location?    Answer:   -[address]   AMB referral to pulmonary rehabilitation    Referral Priority:   Routine    Referral Type:   Consultation    Number of Visits Requested:   1   Split night study    Standing Status:   Future    Standing Expiration Date:   07/18/2022    Order Specific Question:   Where should this test be performed:    Answer:   [place]   No orders of the defined types were placed in this encounter.   No follow-ups on file. Arrange follow-up with Dr. Valeta Harms in 3 months  I have spent a total time of 39-minutes on the day of the appointment reviewing prior documentation, coordinating care and discussing medical diagnosis and plan with the patient/family. Imaging, labs and tests included in this note have been reviewed and interpreted independently by  me.  Vida Nicol Rodman Pickle, MD [facility] 07/18/2021 12:08 PM  Office Number [phone]

## 2021-07-21 DIAGNOSIS — G8929 Other chronic pain: Secondary | ICD-10-CM | POA: Diagnosis not present

## 2021-07-21 DIAGNOSIS — I10 Essential (primary) hypertension: Secondary | ICD-10-CM | POA: Diagnosis not present

## 2021-07-21 DIAGNOSIS — G5603 Carpal tunnel syndrome, bilateral upper limbs: Secondary | ICD-10-CM | POA: Diagnosis not present

## 2021-07-21 DIAGNOSIS — M1711 Unilateral primary osteoarthritis, right knee: Secondary | ICD-10-CM | POA: Diagnosis not present

## 2021-07-21 DIAGNOSIS — Z7409 Other reduced mobility: Secondary | ICD-10-CM | POA: Diagnosis not present

## 2021-07-22 ENCOUNTER — Telehealth: Payer: Self-pay | Admitting: Nurse Practitioner

## 2021-07-25 ENCOUNTER — Other Ambulatory Visit: Payer: Self-pay | Admitting: Medical

## 2021-07-25 ENCOUNTER — Ambulatory Visit: Payer: Medicaid Other | Admitting: Pulmonary Disease

## 2021-07-25 ENCOUNTER — Other Ambulatory Visit (HOSPITAL_COMMUNITY)
Admission: RE | Admit: 2021-07-25 | Discharge: 2021-07-25 | Disposition: A | Discharge status: Home / Self Care | Payer: Medicaid Other | Source: Ambulatory Visit | Attending: Medical | Admitting: Medical

## 2021-07-25 ENCOUNTER — Telehealth: Payer: Self-pay | Admitting: Registered Nurse

## 2021-07-25 ENCOUNTER — Ambulatory Visit (INDEPENDENT_AMBULATORY_CARE_PROVIDER_SITE_OTHER): Payer: Medicaid Other | Admitting: Medical

## 2021-07-25 ENCOUNTER — Other Ambulatory Visit: Payer: Medicaid Other

## 2021-07-25 ENCOUNTER — Encounter: Payer: Self-pay | Admitting: Medical

## 2021-07-25 ENCOUNTER — Other Ambulatory Visit: Payer: Self-pay

## 2021-07-25 VITALS — BP 156/90 | HR 111 | Ht 64.0 in | Wt 265.0 lb

## 2021-07-25 DIAGNOSIS — Z1239 Encounter for other screening for malignant neoplasm of breast: Secondary | ICD-10-CM | POA: Diagnosis not present

## 2021-07-25 DIAGNOSIS — Z124 Encounter for screening for malignant neoplasm of cervix: Secondary | ICD-10-CM

## 2021-07-25 DIAGNOSIS — Z1231 Encounter for screening mammogram for malignant neoplasm of breast: Secondary | ICD-10-CM

## 2021-07-25 DIAGNOSIS — N939 Abnormal uterine and vaginal bleeding, unspecified: Secondary | ICD-10-CM | POA: Diagnosis not present

## 2021-07-25 DIAGNOSIS — Z113 Encounter for screening for infections with a predominantly sexual mode of transmission: Secondary | ICD-10-CM | POA: Diagnosis not present

## 2021-07-25 DIAGNOSIS — Z01419 Encounter for gynecological examination (general) (routine) without abnormal findings: Secondary | ICD-10-CM | POA: Diagnosis not present

## 2021-07-25 MED ORDER — NORETHINDRONE ACETATE 5 MG PO TABS: 15.0000 mg | ORAL_TABLET | Freq: Three times a day (TID) | ORAL | 0 refills | Status: DC

## 2021-07-25 MED ORDER — HYDROCODONE-ACETAMINOPHEN 10-325 MG PO TABS: 1.0000 | ORAL_TABLET | Freq: Three times a day (TID) | ORAL | 0 refills | Status: DC | PRN

## 2021-07-25 NOTE — Telephone Encounter (Signed)
PMP was Reviewed: Hydrocodone e-scribed. Ms. Carreras is aware via My- Chart

## 2021-07-25 NOTE — Progress Notes (Signed)
History:  [name] is a 45 y.o. G3P0 who presents to clinic today for annual exam with pap smear today. The patient is on Depo Provera and uses Aygestin for breakthrough bleeding. The denies abnormal discharge, pelvic pain, changes in bleeding or breast concerns. She denies any GYN concerns today. Her last pap smear was 07/25/2017 and normal. Her last mammogram was "a few" years ago and normal.     The following portions of the patient's history were reviewed and updated as appropriate: allergies, current medications, family history, past medical history, social history, past surgical history and problem list.  Review of Systems:  Review of Systems  Constitutional:  Negative for chills, fever, malaise/fatigue and weight loss.  Gastrointestinal:  Positive for abdominal pain.  Genitourinary:  Negative for dysuria, frequency and urgency.       Neg - vaginal bleeding, discharge     Objective:  Physical Exam BP (!) 156/90   Pulse (!) 111   Ht 5\' 4"  (1.626 m)   Wt 265 lb (120.2 kg)   LMP  (LMP Unknown)   BMI 45.49 kg/m  Physical Exam Vitals and nursing note reviewed. Exam conducted with a chaperone present.  Constitutional:      General: She is not in acute distress.    Appearance: She is well-developed. She is obese.  HENT:     Head: Normocephalic and atraumatic.  Neck:     Thyroid: No thyromegaly.  Cardiovascular:     Rate and Rhythm: Normal rate.  Pulmonary:     Effort: Pulmonary effort is normal.  Chest:  Breasts:    Right: Normal.     Left: Normal.  Abdominal:     General: There is no distension.     Palpations: Abdomen is soft. There is no mass.     Tenderness: There is no abdominal tenderness. There is no guarding or rebound.  Genitourinary:    General: Normal vulva.     Vagina: No vaginal discharge or bleeding.     Cervix: No cervical motion tenderness, discharge or friability.     Uterus: Not enlarged (exam limited by patient body habitus) and not  tender.      Adnexa:        Right: No mass or tenderness.         Left: No mass or tenderness.    Skin:    General: Skin is warm and dry.     Findings: No erythema.  Neurological:     Mental Status: She is alert and oriented to person, place, and time.    Health Maintenance Due  Topic Date Due   COVID-19 Vaccine (3 - Mixed Product risk series) 06/02/2020   PAP SMEAR-Modifier  07/25/2020   FOOT EXAM  06/02/2021     Assessment & Plan:  1. Cervical cancer screening - Cytology - PAP( [place])  2. Screening for STD (sexually transmitted disease) - HIV Antibody (routine testing w rflx) - RPR - Hepatitis B Surface AntiGEN - Hepatitis C Antibody - Cytology - PAP( [place])  3. Breast screening - MM 3D SCREEN BREAST BILATERAL; Future  4. Abnormal uterine bleeding (AUB) - norethindrone (AYGESTIN) 5 MG tablet; Take 3 tablets (15 mg total) by mouth 3 (three) times daily. With bleeding  Dispense: 270 tablet; Refill: 0  Approximately 15 minutes of total time was spent with this patient on history taking, coordination of care, physical exam and documentation.  Patient to return to [facility] in 1 year for annual exam  or sooner PRN. Will contact via MyChart with results.  [place], [place], PA-C 07/25/2021 3:08 PM

## 2021-07-26 ENCOUNTER — Telehealth: Payer: Self-pay | Admitting: Pulmonary Disease

## 2021-07-26 LAB — RPR: RPR Ser Ql: NONREACTIVE

## 2021-07-26 LAB — HIV ANTIBODY (ROUTINE TESTING W REFLEX): HIV Screen 4th Generation wRfx: NONREACTIVE

## 2021-07-26 LAB — HEPATITIS B SURFACE ANTIGEN: Hepatitis B Surface Ag: NEGATIVE

## 2021-07-26 LAB — HEPATITIS C ANTIBODY: Hep C Virus Ab: <0.1 s/co ratio (ref 0.0–0.9)

## 2021-07-26 NOTE — Telephone Encounter (Signed)
LMTCB

## 2021-07-27 ENCOUNTER — Inpatient Hospital Stay: Admission: RE | Admit: 2021-07-27 | Payer: Medicaid Other | Source: Ambulatory Visit

## 2021-07-27 LAB — CYTOLOGY - PAP
Chlamydia: NEGATIVE
Comment: NEGATIVE
Comment: NEGATIVE
Comment: NEGATIVE
Comment: NORMAL
Diagnosis: NEGATIVE
High risk HPV: NEGATIVE
Neisseria Gonorrhea: NEGATIVE
Trichomonas: NEGATIVE

## 2021-07-27 NOTE — Telephone Encounter (Signed)
Received message stating patient was in lobby needing to speak with a nurse. Patient states she needs a letter stating her limitations. She states when she was last here Dr Everardo All told her she was very limited in what she could do physically due to her high heart reate. She states the letter needs to state specifically what her limitations are. Aware JE is not clinic and returns next week. States she can wait until then. I inquired as to whether she has a cardiologist. I asked if she had contacted her cardiologist in this regard since it is dealing with her heart. She states she has reached out to them as well.   JE please advise.

## 2021-07-29 ENCOUNTER — Other Ambulatory Visit: Payer: Self-pay

## 2021-07-29 ENCOUNTER — Other Ambulatory Visit: Payer: Self-pay | Admitting: Medical

## 2021-07-29 ENCOUNTER — Ambulatory Visit (INDEPENDENT_AMBULATORY_CARE_PROVIDER_SITE_OTHER): Payer: Medicaid Other | Admitting: Nurse Practitioner

## 2021-07-29 ENCOUNTER — Encounter: Payer: Self-pay | Admitting: Nurse Practitioner

## 2021-07-29 VITALS — BP 167/69 | HR 97 | Temp 98.8°F | Ht 62.0 in | Wt 265.0 lb

## 2021-07-29 DIAGNOSIS — M79601 Pain in right arm: Secondary | ICD-10-CM | POA: Diagnosis not present

## 2021-07-29 DIAGNOSIS — M62838 Other muscle spasm: Secondary | ICD-10-CM

## 2021-07-29 DIAGNOSIS — R200 Anesthesia of skin: Secondary | ICD-10-CM

## 2021-07-29 DIAGNOSIS — N939 Abnormal uterine and vaginal bleeding, unspecified: Secondary | ICD-10-CM

## 2021-07-29 DIAGNOSIS — R232 Flushing: Secondary | ICD-10-CM

## 2021-07-29 DIAGNOSIS — Z794 Long term (current) use of insulin: Secondary | ICD-10-CM | POA: Diagnosis not present

## 2021-07-29 DIAGNOSIS — E119 Type 2 diabetes mellitus without complications: Secondary | ICD-10-CM

## 2021-07-29 DIAGNOSIS — L299 Pruritus, unspecified: Secondary | ICD-10-CM

## 2021-07-29 DIAGNOSIS — M24849 Other specific joint derangements of unspecified hand, not elsewhere classified: Secondary | ICD-10-CM | POA: Diagnosis not present

## 2021-07-29 DIAGNOSIS — L819 Disorder of pigmentation, unspecified: Secondary | ICD-10-CM

## 2021-07-29 DIAGNOSIS — Z0000 Encounter for general adult medical examination without abnormal findings: Secondary | ICD-10-CM | POA: Diagnosis not present

## 2021-07-29 LAB — POCT URINALYSIS DIP (CLINITEK)
Bilirubin, UA: NEGATIVE
Blood, UA: NEGATIVE
Glucose, UA: 500 mg/dL — AB
Ketones, POC UA: NEGATIVE mg/dL
Leukocytes, UA: NEGATIVE
Nitrite, UA: NEGATIVE
POC PROTEIN,UA: NEGATIVE
Spec Grav, UA: >=1.03 — AB (ref 1.010–1.025)
Urobilinogen, UA: 0.2 E.U./dL
pH, UA: 6 (ref 5.0–8.0)

## 2021-07-29 LAB — POCT GLYCOSYLATED HEMOGLOBIN (HGB A1C)
HbA1c POC (<> result, manual entry): 7.9 % (ref 4.0–5.6)
HbA1c, POC (controlled diabetic range): 7.9 % — AB (ref 0.0–7.0)
HbA1c, POC (prediabetic range): 7.9 % — AB (ref 5.7–6.4)
Hemoglobin A1C: 7.9 % — AB (ref 4.0–5.6)

## 2021-07-29 MED ORDER — TIZANIDINE HCL 4 MG PO TABS: 6.0000 mg | ORAL_TABLET | Freq: Four times a day (QID) | ORAL | 3 refills | Status: DC | PRN

## 2021-07-29 MED ORDER — ACCU-CHEK GUIDE VI STRP: ORAL_STRIP | 4 refills | Status: DC

## 2021-07-29 NOTE — Progress Notes (Signed)
[facility] Patient [facility] [name] [place], [place]  [address] Phone:  [phone]   Fax:  [phone] Subjective:   Patient ID: Jill Shaw, female    DOB: 10/07/1976, 45 y.o.   MRN: [id number]  No chief complaint on file.  HPI Jill Shaw 45 y.o. female with extensive medical history  has a past medical history of Anemia, Arthritis, Asthma, Chronic diastolic (congestive) heart failure (HCC), COPD (chronic obstructive pulmonary disease) (HCC), Diabetes mellitus without complication (HCC), DKA (diabetic ketoacidosis) (HCC) (10/15/2020), Dysfunctional uterine bleeding, GERD (gastroesophageal reflux disease), Hypertension, Neuromuscular disorder (HCC), Seizures (HCC) (09/12/2017), Sickle cell trait (HCC), Smoker, Vitamin D deficiency (10/2019), and Wears glasses. To the [facility] for evaluation of multiple complaints.   Patient states that she would like to be evaluated for thyroid disease and menopause. Being evaluated and treated by dermatology for diffuse facial itching and discoloration, recommended that her PCP evaluate her for thyroid disorder and menopause. Patient states that she also typically has hot flashes throughout the year.  Has had diffuse facial itching x 3 yrs and been prescribed several medications in the past with no improvement.  Also complaining of right thumb clicking x 1 mth and decreased strength in bilateral hands. Worsening numbness and nerve pain radiating from finger on right hand to elbow. Takes hydrocodone regularly for pain and is managed by pain management. Has been unable to complete many daily activities due to pain and increased weakness. Denies any recent trauma or injury.  Has had worsening diffuse muscle spasms, requesting increase of tizanidine and refill. Also requesting refill of accucheck supplies. Currently unemployed and applying for disability, unable to work due to her many chronic illnesses and chronic pain.  Does not adhere to a  specific diet and/ exercise regimen. Currently compliant with all medications.  Past Medical History:  Diagnosis Date   Anemia    Arthritis    knees, hands   Asthma    Chronic diastolic (congestive) heart failure (HCC)    COPD (chronic obstructive pulmonary disease) (HCC)    Diabetes mellitus without complication (HCC)    type 2   DKA (diabetic ketoacidosis) (HCC) 10/15/2020   Dysfunctional uterine bleeding    GERD (gastroesophageal reflux disease)    Hypertension    Neuromuscular disorder (HCC)    neuropathy feet   Seizures (HCC) 09/12/2017   pt states r/t stress and blood sugar - no meds last one 4 months ago, not seen neurologist   Sickle cell trait (HCC)    Smoker    Vitamin D deficiency 10/2019   Wears glasses     Past Surgical History:  Procedure Laterality Date   CESAREAN SECTION     x 1. for twins   DILATION AND CURETTAGE OF UTERUS N/A 08/20/2019   Procedure: DILATATION AND CURETTAGE;  Surgeon: Allie Bossier, MD;  Location: [facility] OR;  Service: Gynecology;  Laterality: N/A;   ENDOMETRIAL ABLATION N/A 08/20/2019   Procedure: Minerva Ablation;  Surgeon: Allie Bossier, MD;  Location: [facility] OR;  Service: Gynecology;  Laterality: N/A;   EYE SURGERY Bilateral    laser right and cataract removed left eye   RADIOLOGY WITH ANESTHESIA N/A 09/16/2019   Procedure: MRI WITH ANESTHESIA   L SPINE WITHOUT CONTRAST, T SPINE WITHOUT CONTRAST , CERVICAL WITHOUT CONTRAST;  Surgeon: Radiologist, Medication, MD;  Location: [facility] OR;  Service: Radiology;  Laterality: N/A;   TUBAL LIGATION     interval BTL   UPPER GI ENDOSCOPY  07/2017  Family History  Problem Relation Age of Onset   Diabetes Mother    Hypertension Mother     Social History   Socioeconomic History   Marital status: Legally Separated    Spouse name: Not on file   Number of children: 3   Years of education: Not on file   Highest education level: Not on file  Occupational History   Occupation: unemployed  Tobacco Use    Smoking status: Some Days    Packs/day: 0.25    Years: 26.00    Pack years: 6.50    Types: Cigarettes    Last attempt to quit: 09/13/2020    Years since quitting: 0.8   Smokeless tobacco: Never   Tobacco comments:    4-5 cigarettes/day  Vaping Use   Vaping Use: Never used  Substance and Sexual Activity   Alcohol use: No   Drug use: No   Sexual activity: Not Currently    Birth control/protection: None  Other Topics Concern   Not on file  Social History Narrative   Right Handed   Lives in a one story apartment, but lives on the second floor   Drinks caffeine once in awhile   Social Determinants of Health   Financial Resource Strain: Not on file  Food Insecurity: No Food Insecurity   Worried About Charity fundraiser in the Last Year: Never true   [place] in the Last Year: Never true  Transportation Needs: No Transportation Needs   Lack of Transportation (Medical): No   Lack of Transportation (Non-Medical): No  Physical Activity: Not on file  Stress: Not on file  Social Connections: Not on file  Intimate Partner Violence: Not on file    Outpatient Medications Prior to Visit  Medication Sig Dispense Refill   Accu-Chek Softclix Lancets lancets USE AS DIRECTED UP TO FOUR TIMES DAILY 100 each 3   albuterol (PROVENTIL) (2.5 MG/3ML) 0.083% nebulizer solution Take 3 mLs (2.5 mg total) by nebulization every 6 (six) hours as needed for wheezing or shortness of breath. 150 mL 6   albuterol (VENTOLIN HFA) 108 (90 Base) MCG/ACT inhaler Inhale 2 puffs into the lungs every 6 (six) hours as needed for wheezing or shortness of breath. 8 g 6   aspirin EC 81 MG tablet Take 1 tablet (81 mg total) by mouth daily. 90 tablet 3   blood glucose meter kit and supplies KIT 1 each by Other route See admin instructions. Dispense based on patient and insurance preference. Use up to four times daily as directed. (FOR ICD-9 250.00, 250.01). 1 each 2   Blood Pressure Monitoring (ADULT BLOOD  PRESSURE CUFF LG) KIT 1 kit by Does not apply route daily. 1 kit 0   budesonide-formoterol (SYMBICORT) 160-4.5 MCG/ACT inhaler INHALE 2 PUFFS INTO THE LUNGS 2 (TWO) TIMES DAILY. 10.2 g 6   cetirizine (ZYRTEC) 10 MG tablet Take 1 tablet (10 mg total) by mouth daily. 30 tablet 3   Continuous Blood Gluc Sensor (DEXCOM G6 SENSOR) MISC 1 Device by Does not apply route as directed. 9 each 3   Continuous Blood Gluc Transmit (DEXCOM G6 TRANSMITTER) MISC 1 Device by Does not apply route as directed. 1 each 3   Dexlansoprazole (DEXILANT) 30 MG capsule Take 1 capsule (30 mg total) by mouth daily. 30 capsule 11   dicyclomine (BENTYL) 20 MG tablet Take 1 tablet (20 mg total) by mouth 3 (three) times daily before meals. 90 tablet 11   empagliflozin (JARDIANCE) 25 MG  TABS tablet Take 1 tablet (25 mg total) by mouth daily before breakfast. 90 tablet 3   ferrous sulfate 325 (65 FE) MG tablet Take 1 tablet (325 mg total) by mouth 3 (three) times daily with meals. 270 tablet 3   fluticasone (FLONASE) 50 MCG/ACT nasal spray Place 2 sprays into both nostrils daily. 16 g 6   furosemide (LASIX) 40 MG tablet TAKE 1 TABLET(40 MG) BY MOUTH TWICE DAILY (Patient taking differently: Take 40 mg by mouth 2 (two) times daily.) 180 tablet 1   Glucosamine Sulfate 1000 MG CAPS Take 1 capsule (1,000 mg total) by mouth 2 (two) times daily. (Patient taking differently: Take 1,000 mg by mouth 2 (two) times daily.) 180 capsule 3   Heating Pads (HEATING PAD MOIST/DRY KING SZ) PADS 1 each by Does not apply route 4 (four) times daily as needed. 120 each 1   hydrochlorothiazide (MICROZIDE) 12.5 MG capsule Take 1 capsule (12.5 mg total) by mouth daily. 30 capsule 3   HYDROcodone-acetaminophen (NORCO) 10-325 MG tablet Take 1 tablet by mouth 3 (three) times daily as needed. Do Not Fill Before 07/28/2021: 90 tablet 0   hydrocortisone 2.5 % cream Apply topically 2 (two) times daily. (Patient taking differently: Apply 1 application topically 2 (two)  times daily.) 30 g 11   hydroquinone 4 % cream APPLY TOPICALLY TWICE DAILY (Patient taking differently: Apply 1 application topically 2 (two) times daily as needed (pain).) 28.35 g 0   insulin glargine (LANTUS SOLOSTAR) 100 UNIT/ML Solostar Pen Inject 110 Units into the skin daily. 105 mL 3   insulin lispro (HUMALOG KWIKPEN) 200 UNIT/ML KwikPen Max daily 180 units 90 mL 3   Insulin Pen Needle 31G X 8 MM MISC 1 Device by Does not apply route in the morning, at noon, in the evening, and at bedtime. 400 each 3   isosorbide mononitrate (IMDUR) 30 MG 24 hr tablet Take 1 tablet (30 mg total) by mouth daily. 90 tablet 3   losartan (COZAAR) 100 MG tablet Take 1 tablet (100 mg total) by mouth daily. TAKE 1 TABLET($RemoveBefor'50MG'[id number]$  TOTAL) BY MOUTH EVERY DAY 30 tablet 3   metoprolol succinate (TOPROL-XL) 100 MG 24 hr tablet Take 100 mg by mouth daily. Take with or immediately following a meal.     norethindrone (AYGESTIN) 5 MG tablet Take 3 tablets (15 mg total) by mouth 3 (three) times daily. With bleeding 270 tablet 0   ondansetron (ZOFRAN ODT) 4 MG disintegrating tablet Take 1 tablet (4 mg total) by mouth every 8 (eight) hours as needed for nausea or vomiting. 10 tablet 0   potassium chloride SA (KLOR-CON) 20 MEQ tablet TAKE 1 TABLET(20 MEQ) BY MOUTH DAILY (Patient taking differently: Take 20 mEq by mouth daily.) 30 tablet 6   rosuvastatin (CRESTOR) 20 MG tablet Take 1 tablet (20 mg total) by mouth daily. 30 tablet 3   saxagliptin HCl (ONGLYZA) 5 MG TABS tablet Take 1 tablet (5 mg total) by mouth daily. 90 tablet 3   senna-docusate (SENOKOT-S) 8.6-50 MG tablet Take 1 tablet by mouth at bedtime as needed for mild constipation. 30 tablet 0   spironolactone (ALDACTONE) 50 MG tablet TAKE 1 TABLET(50 MG) BY MOUTH DAILY (Patient taking differently: Take 50 mg by mouth daily.) 30 tablet 3   sucralfate (CARAFATE) 1 GM/10ML suspension Take 10 mLs (1 g total) by mouth 4 (four) times daily. 3600 mL 3   Tiotropium Bromide  Monohydrate (SPIRIVA RESPIMAT) 2.5 MCG/ACT AERS Inhale 2 puffs into the lungs daily.  4 g 6   TOBRADEX ophthalmic solution Place 1 drop into the right eye 4 (four) times daily.     Vitamin D, Ergocalciferol, (DRISDOL) 1.25 MG (50000 UNIT) CAPS capsule Take 1 capsule (50,000 Units total) by mouth every 7 (seven) days for 8 doses. (Patient taking differently: Take 50,000 Units by mouth every Sunday.) 8 capsule 0   vitamin E (VITAMIN E) 180 MG (400 UNITS) capsule Take 1 capsule (400 Units total) by mouth daily. 30 capsule 1   ACCU-CHEK GUIDE test strip USE AS DIRECTED UP TO FOUR TIMES DAILY (Patient taking differently: 1 each by Other route in the morning, at noon, in the evening, and at bedtime.) 100 strip 4   tiZANidine (ZANAFLEX) 4 MG tablet Take 1 tablet (4 mg total) by mouth every 6 (six) hours as needed for muscle spasms. 120 tablet 3   No facility-administered medications prior to visit.    Allergies  Allergen Reactions   Amitriptyline Other (See Comments)    Coma   Ketoprofen Nausea And Vomiting   Trazodone Nausea And Vomiting   Aspirin Nausea Only   Gabapentin Nausea And Vomiting and Other (See Comments)    upset stomach   Ibuprofen Nausea And Vomiting   Liraglutide Nausea And Vomiting   Naproxen Nausea And Vomiting   Omeprazole-Sodium Bicarbonate Nausea And Vomiting   Sulfa Antibiotics Nausea And Vomiting   Tramadol Nausea And Vomiting and Other (See Comments)    stomach upset    Review of Systems  Constitutional:  Negative for chills, fever and malaise/fatigue.       Hot flashes  HENT: Negative.    Respiratory:  Negative for cough and shortness of breath.   Cardiovascular:  Negative for chest pain, palpitations and leg swelling.  Gastrointestinal:  Negative for abdominal pain, blood in stool, constipation, diarrhea, nausea and vomiting.  Genitourinary: Negative.   Musculoskeletal:  Positive for joint pain.       Muscle spasms  Skin:  Positive for itching. Negative for  rash.  Neurological:  Positive for sensory change. Negative for dizziness, tingling, tremors, speech change, seizures and headaches.       Weakness in bilateral hands  Psychiatric/Behavioral:  Negative for depression. The patient is not nervous/anxious.   All other systems reviewed and are negative.     Objective:    Physical Exam Vitals reviewed.  Constitutional:      General: She is not in acute distress.    Appearance: Normal appearance.  HENT:     Head: Normocephalic.     Nose: Nose normal.     Mouth/Throat:     Mouth: Mucous membranes are moist.     Pharynx: Oropharynx is clear.  Eyes:     Extraocular Movements: Extraocular movements intact.     Conjunctiva/sclera: Conjunctivae normal.     Pupils: Pupils are equal, round, and reactive to light.  Cardiovascular:     Rate and Rhythm: Normal rate and regular rhythm.     Pulses: Normal pulses.     Heart sounds: Normal heart sounds.     Comments: No obvious peripheral edema Pulmonary:     Effort: Pulmonary effort is normal.     Breath sounds: Normal breath sounds.  Musculoskeletal:        General: Normal range of motion.     Right hand: Swelling present. No tenderness or bony tenderness. Normal range of motion. Decreased strength. Normal capillary refill. Normal pulse.     Left hand: Swelling present. No tenderness or bony  tenderness. Normal range of motion. Decreased strength. Normal capillary refill. Normal pulse.     Comments: Mild swelling in bilateral hands, consistent with history of arthralgia   Skin:    General: Skin is warm and dry.     Capillary Refill: Capillary refill takes less than 2 seconds.  Neurological:     General: No focal deficit present.     Mental Status: She is alert and oriented to person, place, and time.  Psychiatric:        Mood and Affect: Mood normal.        Behavior: Behavior normal.        Thought Content: Thought content normal.        Judgment: Judgment normal.    BP (!) 167/69    Pulse 97   Temp 98.8 F (37.1 C)   LMP  (LMP Unknown)   SpO2 100%  Wt Readings from Last 3 Encounters:  07/25/21 265 lb (120.2 kg)  07/18/21 267 lb 12.8 oz (121.5 kg)  07/15/21 271 lb 9.6 oz (123.2 kg)    Immunization History  Administered Date(s) Administered   Influenza,inj,Quad PF,6+ Mos 09/03/2018, 07/03/2019, 07/04/2020, 07/01/2021   Pneumococcal Polysaccharide-23 03/28/2015, 07/04/2020   Tdap 03/27/2016   Unspecified SARS-COV-2 Vaccination 03/25/2020, 05/05/2020    Diabetic Foot Exam - Simple   No data filed     Lab Results  Component Value Date   TSH 0.969 01/10/2021   Lab Results  Component Value Date   WBC 11.9 (H) 06/22/2021   HGB 10.5 (L) 06/22/2021   HCT 34.3 (L) 06/22/2021   MCV 71.5 (L) 06/22/2021   PLT 240 06/22/2021   Lab Results  Component Value Date   NA 140 06/22/2021   K 4.1 06/22/2021   CO2 23 06/22/2021   GLUCOSE 181 (H) 06/22/2021   BUN 11 06/22/2021   CREATININE 0.93 06/22/2021   BILITOT 0.5 06/22/2021   ALKPHOS 95 06/22/2021   AST 31 06/22/2021   ALT 15 06/22/2021   PROT 6.7 06/22/2021   ALBUMIN 3.6 06/22/2021   CALCIUM 9.9 06/22/2021   ANIONGAP 9 06/22/2021   GFR 57.50 (L) 07/12/2020   Lab Results  Component Value Date   CHOL 158 06/14/2021   CHOL 135 06/02/2020   CHOL 151 11/05/2019   Lab Results  Component Value Date   HDL 36 (L) 06/14/2021   HDL 29 (L) 06/02/2020   HDL 55 11/05/2019   Lab Results  Component Value Date   LDLCALC 92 06/14/2021   LDLCALC 77 06/02/2020   LDLCALC 63 11/05/2019   Lab Results  Component Value Date   TRIG 171 (H) 06/14/2021   TRIG 280 (H) 11/20/2020   TRIG 166 (H) 06/02/2020   Lab Results  Component Value Date   CHOLHDL 4.4 06/14/2021   CHOLHDL 4.7 (H) 06/02/2020   CHOLHDL 2.7 11/05/2019   Lab Results  Component Value Date   HGBA1C 7.9 (A) 07/29/2021   HGBA1C 7.9 07/29/2021   HGBA1C 7.9 (A) 07/29/2021   HGBA1C 7.9 (A) 07/29/2021       Assessment & Plan:   Problem List  Items Addressed This Visit       Cardiovascular and Mediastinum   Hot flashes   Relevant Orders   Estrogens, Total May require investigation for possible medication side effect     Endocrine   Type 2 diabetes mellitus without complication, with long-term current use of insulin (HCC)   Relevant Medications   glucose blood (ACCU-CHEK GUIDE) test strip Encouraged continued  diet and exercise efforts  Encouraged continued compliance with medication      Other   Healthcare maintenance - Primary   Relevant Orders   CBC with Differential/Platelet   Comprehensive metabolic panel   Lipid panel   POCT URINALYSIS DIP (CLINITEK) (Completed)   Other Visit Diagnoses     Itching       Relevant Orders   Estrogens, Total   TSH   T4, Free Informed to maintain appointments with dermatology. Continue taking medications as prescribed until established change in treatment plan May require investigation for possible medications side effect    Morbid obesity ([place])       Relevant Orders   CBC with Differential/Platelet   Comprehensive metabolic panel   Lipid panel   POCT glycosylated hemoglobin (Hb A1C) (Completed)   POCT URINALYSIS DIP (CLINITEK) (Completed)   Discoloration of skin of face       Relevant Orders   Estrogens, Total   Pain and numbness of right upper extremity     Continue taking previously prescribed medications as needed for pain Maintain upcoming follow up with management   Thumb locking       Relevant Orders   DG Finger Thumb Right   Ambulatory referral to Orthopedic Surgery Patient informed to maintain upcoming follow up with pain management   Muscle spasm       Relevant Medications   tiZANidine (ZANAFLEX) 4 MG tablet, increased to 6 mg PRN   Type 2 diabetes mellitus without complication, unspecified whether long term insulin use (HCC)       Relevant Medications   glucose blood (ACCU-CHEK GUIDE) test strip   Other Relevant Orders   CBC with Differential/Platelet    POCT glycosylated hemoglobin (Hb A1C) (Completed)   POCT URINALYSIS DIP (CLINITEK) (Completed) Encouraged continued diet and exercise efforts  Encouraged continued compliance with medication     Follow up in 2 mths for reevaluation of symptoms, sooner as needed     I have changed Jill Shaw's Accu-Chek Guide and tiZANidine. I am also having her maintain her Glucosamine Sulfate, blood glucose meter kit and supplies, hydroquinone, hydrocortisone, ferrous sulfate, fluticasone, saxagliptin HCl, spironolactone, vitamin E, furosemide, hydrochlorothiazide, Heating Pad Moist/Dry King Sz, Insulin Pen Needle, Accu-Chek Softclix Lancets, metoprolol succinate, senna-docusate, Dexlansoprazole, TobraDex, Adult Blood Pressure Cuff Lg, sucralfate, potassium chloride SA, cetirizine, Vitamin D (Ergocalciferol), ondansetron, dicyclomine, empagliflozin, HumaLOG KwikPen, Lantus SoloStar, Dexcom G6 Transmitter, Dexcom G6 Sensor, aspirin EC, isosorbide mononitrate, rosuvastatin, losartan, budesonide-formoterol, Spiriva Respimat, albuterol, albuterol, HYDROcodone-acetaminophen, and norethindrone.  Meds ordered this encounter  Medications   glucose blood (ACCU-CHEK GUIDE) test strip    Sig: USE AS DIRECTED UP TO FOUR TIMES DAILY    Dispense:  100 strip    Refill:  4   tiZANidine (ZANAFLEX) 4 MG tablet    Sig: Take 1.5 tablets (6 mg total) by mouth every 6 (six) hours as needed for muscle spasms.    Dispense:  120 tablet    Refill:  3     Teena Dunk, NP

## 2021-07-29 NOTE — Patient Instructions (Signed)
You were seen today in the [facility] for reevaluation of chronic illness and new complaints. Labs were collected, results will be available via MyChart or, if abnormal, you will be contacted by clinic staff. You were prescribed medications, please take as directed. Referral completed to orthopedics. Please follow up in 2 mths for reevaluation of symptoms.

## 2021-08-01 ENCOUNTER — Encounter: Payer: Self-pay | Admitting: Registered Nurse

## 2021-08-01 ENCOUNTER — Other Ambulatory Visit: Payer: Self-pay

## 2021-08-01 ENCOUNTER — Encounter: Payer: Medicaid Other | Attending: Physical Medicine and Rehabilitation | Admitting: Registered Nurse

## 2021-08-01 VITALS — BP 177/78 | HR 94 | Temp 99.0°F | Wt 275.6 lb

## 2021-08-01 DIAGNOSIS — M5412 Radiculopathy, cervical region: Secondary | ICD-10-CM

## 2021-08-01 DIAGNOSIS — I10 Essential (primary) hypertension: Secondary | ICD-10-CM

## 2021-08-01 DIAGNOSIS — M797 Fibromyalgia: Secondary | ICD-10-CM

## 2021-08-01 DIAGNOSIS — G894 Chronic pain syndrome: Secondary | ICD-10-CM

## 2021-08-01 DIAGNOSIS — M5416 Radiculopathy, lumbar region: Secondary | ICD-10-CM | POA: Diagnosis not present

## 2021-08-01 DIAGNOSIS — Z79899 Other long term (current) drug therapy: Secondary | ICD-10-CM | POA: Diagnosis not present

## 2021-08-01 DIAGNOSIS — G629 Polyneuropathy, unspecified: Secondary | ICD-10-CM | POA: Diagnosis not present

## 2021-08-01 DIAGNOSIS — M542 Cervicalgia: Secondary | ICD-10-CM | POA: Diagnosis not present

## 2021-08-01 DIAGNOSIS — M170 Bilateral primary osteoarthritis of knee: Secondary | ICD-10-CM

## 2021-08-01 DIAGNOSIS — Z5181 Encounter for therapeutic drug level monitoring: Secondary | ICD-10-CM | POA: Diagnosis not present

## 2021-08-01 DIAGNOSIS — H524 Presbyopia: Secondary | ICD-10-CM | POA: Diagnosis not present

## 2021-08-01 NOTE — Progress Notes (Signed)
Subjective:    Patient ID: Jill Shaw, female    DOB: 07/28/1976, 45 y.o.   MRN: [id number]  HPI: Jill Shaw is a 45 y.o. female who returns for follow up appointment for chronic pain and medication refill. She states her pain is located in her right shoulder with tingling and burning, lower back pain and bilateral lower extremities with numbness and tingling. Also reports bilateral knee pain.She rates her pain 10. Her current exercise regime is walking and performing stretching exercises.  Jill Shaw is 30.00 MME.  Oral Swab was Performed today.     Pain Inventory Average Pain 10 Pain Right Now 10 My pain is constant, sharp, burning, dull, stabbing, tingling, and aching  In the last 24 hours, has pain interfered with the following? General activity 10 Relation with others 10 Enjoyment of life 10 What TIME of day is your pain at its worst? morning , daytime, evening, and night Sleep (in general) Poor  Pain is worse with: walking, bending, sitting, inactivity, standing, and some activites Pain improves with: heat/ice and medication Relief from Meds: 10  Family History  Problem Relation Age of Onset   Diabetes Mother    Hypertension Mother    Social History   Socioeconomic History   Marital status: Legally Separated    Spouse name: Not on file   Number of children: 3   Years of education: Not on file   Highest education level: Not on file  Occupational History   Occupation: unemployed  Tobacco Use   Smoking status: Some Days    Packs/day: 0.25    Years: 26.00    Pack years: 6.50    Types: Cigarettes    Last attempt to quit: 09/13/2020    Years since quitting: 0.8   Smokeless tobacco: Never   Tobacco comments:    4-5 cigarettes/day  Vaping Use   Vaping Use: Never used  Substance and Sexual Activity   Alcohol use: No   Drug use: No   Sexual activity: Not Currently    Birth control/protection: None  Other Topics Concern   Not on  file  Social History Narrative   Right Handed   Lives in a one story apartment, but lives on the second floor   Drinks caffeine once in awhile   Social Determinants of Health   Financial Resource Strain: Not on file  Food Insecurity: No Food Insecurity   Worried About Programme researcher, broadcasting/film/video in the Last Year: Never true   Ran Out of Food in the Last Year: Never true  Transportation Needs: No Transportation Needs   Lack of Transportation (Medical): No   Lack of Transportation (Non-Medical): No  Physical Activity: Not on file  Stress: Not on file  Social Connections: Not on file   Past Surgical History:  Procedure Laterality Date   CESAREAN SECTION     x 1. for twins   DILATION AND CURETTAGE OF UTERUS N/A 08/20/2019   Procedure: DILATATION AND CURETTAGE;  Surgeon: Allie Bossier, MD;  Location: [facility] OR;  Service: Gynecology;  Laterality: N/A;   ENDOMETRIAL ABLATION N/A 08/20/2019   Procedure: Minerva Ablation;  Surgeon: Allie Bossier, MD;  Location: [facility] OR;  Service: Gynecology;  Laterality: N/A;   EYE SURGERY Bilateral    laser right and cataract removed left eye   RADIOLOGY WITH ANESTHESIA N/A 09/16/2019   Procedure: MRI WITH ANESTHESIA   L SPINE WITHOUT CONTRAST, T SPINE WITHOUT CONTRAST , CERVICAL WITHOUT CONTRAST;  Surgeon: Radiologist, Medication, MD;  Location: [facility] OR;  Service: Radiology;  Laterality: N/A;   TUBAL LIGATION     interval BTL   UPPER GI ENDOSCOPY  07/2017   Past Surgical History:  Procedure Laterality Date   CESAREAN SECTION     x 1. for twins   DILATION AND CURETTAGE OF UTERUS N/A 08/20/2019   Procedure: DILATATION AND CURETTAGE;  Surgeon: Allie Bossier, MD;  Location: [facility] OR;  Service: Gynecology;  Laterality: N/A;   ENDOMETRIAL ABLATION N/A 08/20/2019   Procedure: Minerva Ablation;  Surgeon: Allie Bossier, MD;  Location: [facility];  Service: Gynecology;  Laterality: N/A;   EYE SURGERY Bilateral    laser right and cataract removed left eye   RADIOLOGY WITH ANESTHESIA N/A  09/16/2019   Procedure: MRI WITH ANESTHESIA   L SPINE WITHOUT CONTRAST, T SPINE WITHOUT CONTRAST , CERVICAL WITHOUT CONTRAST;  Surgeon: Radiologist, Medication, MD;  Location: [facility] OR;  Service: Radiology;  Laterality: N/A;   TUBAL LIGATION     interval BTL   UPPER GI ENDOSCOPY  07/2017   Past Medical History:  Diagnosis Date   Anemia    Arthritis    knees, hands   Asthma    Chronic diastolic (congestive) heart failure (HCC)    COPD (chronic obstructive pulmonary disease) (HCC)    Diabetes mellitus without complication (HCC)    type 2   DKA (diabetic ketoacidosis) (HCC) 10/15/2020   Dysfunctional uterine bleeding    GERD (gastroesophageal reflux disease)    Hypertension    Neuromuscular disorder (HCC)    neuropathy feet   Seizures (HCC) 09/12/2017   pt states r/t stress and blood sugar - no meds last one 4 months ago, not seen neurologist   Sickle cell trait (HCC)    Smoker    Vitamin D deficiency 10/2019   Wears glasses    Temp 99 F (37.2 C)   Wt 275 lb 9.6 oz (125 kg)   BMI 50.41 kg/m   Opioid Risk Score:   Fall Risk Score:  `1  Depression screen PHQ 2/9  Depression screen [facility] 2/9 08/01/2021 07/29/2021 06/14/2021 04/15/2021 03/30/2021 03/22/2021 01/31/2021  Decreased Interest 1 2 0 2 0 1 1  Down, Depressed, Hopeless 1 2 0 2 0 1 1  PHQ - 2 Score 2 4 0 4 0 2 2  Altered sleeping - 3 - 3 - - -  Tired, decreased energy - 2 - 3 - - -  Change in appetite - 0 - 0 - - -  Feeling bad or failure about yourself  - - - 0 - - -  Trouble concentrating - 2 - 2 - - -  Moving slowly or fidgety/restless - 0 - 0 - - -  Suicidal thoughts - 0 - 0 - - -  PHQ-9 Score - 11 - 12 - - -  Difficult doing work/chores - - - - - - -  Some recent data might be hidden     Review of Systems  Constitutional: Negative.   HENT: Negative.    Eyes: Negative.   Respiratory: Negative.    Cardiovascular: Negative.   Gastrointestinal:  Positive for abdominal pain.  Endocrine: Negative.   Genitourinary:  Negative.   Musculoskeletal:  Positive for back pain and gait problem.  Skin: Negative.   Allergic/Immunologic: Negative.   Neurological:  Positive for dizziness, light-headedness and headaches.  Hematological: Negative.   Psychiatric/Behavioral:  Positive for dysphoric mood.       Objective:  Physical Exam Vitals and nursing note reviewed.  Constitutional:      Appearance: Normal appearance.  Cardiovascular:     Rate and Rhythm: Normal rate and regular rhythm.     Pulses: Normal pulses.     Heart sounds: Normal heart sounds.  Pulmonary:     Effort: Pulmonary effort is normal.     Breath sounds: Normal breath sounds.  Musculoskeletal:     Cervical back: Normal range of motion and neck supple.     Comments: Normal Muscle Bulk and Muscle Testing Reveals:  Upper Extremities: Full ROM and Muscle Strength 4/5  Right AC Joint Tenderness Lumbar Hypersensitivity Lower Extremities: Full ROM and Muscle Strength 5/5 Arises from Table with ease Narrow Based  Gait     Skin:    General: Skin is warm and dry.  Neurological:     Mental Status: She is alert and oriented to person, place, and time.  Psychiatric:        Mood and Affect: Mood normal.        Behavior: Behavior normal.         Assessment & Plan:  1. Lumbar Radiculitis: Continue current medication regimen. Continue HEP as Tolerated.08/01/2021 2. Fibromyalgia/Polyneuropathy: Continue HEP as Tolerated. Continue current Medication regimen. Continue to Monitor. 08/01/2021 3. Bilateral Knee Pain: No complaints today. Continue to Monitor.08/01/2021 4. Chronic Pain Syndrome: Continue Hydrocodone 10/325mg  one tablet twice a day as needed for pain #60. We will continue the opioid monitoring program, this consists of regular clinic visits, examinations, urine drug screen, pill counts as well as use of [place] Controlled Substance Reporting system. A 12 month History has been reviewed on the [place] Controlled Substance  Reporting System on 08/01/2021.  5. Tachycardia: Apical pulse check. Jill Shaw will F/U with her PCP Continue to monitor.  08/01/2021 6. Uncontrolled Hypertension: Refuses ED evaluation. Blood pressure was re-checked. Jill Shaw states she is compliant with her medication, she will F/U with her PCP. We will continue to monitor. 08/01/2021   F/U in 1 month

## 2021-08-02 NOTE — Telephone Encounter (Signed)
Spoke with the pt  She is requesting update on letter from Dr Everardo All about her limitations  Pt aware JE back in office on 10/24 Please see below per [place]  Please advise on letter, thanks

## 2021-08-02 NOTE — Telephone Encounter (Signed)
Pt calling back [phone]

## 2021-08-02 NOTE — Telephone Encounter (Signed)
Mrs. Hartl primary pulmonologist is Dr. Tonia Brooms. She was mistakenly referred to my clinic and this was clarified with Dr. Tonia Brooms. Her follow-up will be continued with him.  Regarding her tachycardia, she was not evaluated for this in our last office visit. She was seen for shortness of breath and had a normal CXR. For her asthma, she was actually referred to Pulmonary Rehab to increase her activity level so there was no recommendation to limit activity on that visit.  Recommend that she discuss with her primary care doctor or cardiologist regarding her tachycardia concerns.

## 2021-08-03 ENCOUNTER — Other Ambulatory Visit: Payer: Self-pay | Admitting: Student

## 2021-08-03 NOTE — Telephone Encounter (Signed)
The patient returned a called back to get Dr. George Hugh recommendations and she voices understanding and nothing further needed.   Patient was recommended that she go to Cardiology for her Tachycardia and she declined to make a visit and she wants to make a visit with Dr. Tonia Brooms. Office visit made with Dr. Tonia Brooms and nothing further needed.

## 2021-08-03 NOTE — Telephone Encounter (Signed)
I called the patient and left a message to call back about message below.

## 2021-08-04 LAB — CBC WITH DIFFERENTIAL/PLATELET
Basophils Absolute: 0 10*3/uL (ref 0.0–0.2)
Basos: 0 %
EOS (ABSOLUTE): 0.4 10*3/uL (ref 0.0–0.4)
Eos: 4 %
Hematocrit: 38 % (ref 34.0–46.6)
Hemoglobin: 11.9 g/dL (ref 11.1–15.9)
Immature Grans (Abs): 0 10*3/uL (ref 0.0–0.1)
Immature Granulocytes: 0 %
Lymphocytes Absolute: 2.3 10*3/uL (ref 0.7–3.1)
Lymphs: 23 %
MCH: 23 pg — ABNORMAL LOW (ref 26.6–33.0)
MCHC: 31.3 g/dL — ABNORMAL LOW (ref 31.5–35.7)
MCV: 74 fL — ABNORMAL LOW (ref 79–97)
Monocytes Absolute: 0.4 10*3/uL (ref 0.1–0.9)
Monocytes: 4 %
Neutrophils Absolute: 6.9 10*3/uL (ref 1.4–7.0)
Neutrophils: 69 %
Platelets: 271 10*3/uL (ref 150–450)
RBC: 5.17 x10E6/uL (ref 3.77–5.28)
RDW: 21.8 % — ABNORMAL HIGH (ref 11.7–15.4)
WBC: 10.1 10*3/uL (ref 3.4–10.8)

## 2021-08-04 LAB — COMPREHENSIVE METABOLIC PANEL
ALT: 10 IU/L (ref 0–32)
AST: 25 IU/L (ref 0–40)
Albumin/Globulin Ratio: 1.8 (ref 1.2–2.2)
Albumin: 4.6 g/dL (ref 3.8–4.8)
Alkaline Phosphatase: 126 IU/L — ABNORMAL HIGH (ref 44–121)
BUN/Creatinine Ratio: 11 (ref 9–23)
BUN: 10 mg/dL (ref 6–24)
Bilirubin Total: <0.2 mg/dL (ref 0.0–1.2)
CO2: 17 mmol/L — ABNORMAL LOW (ref 20–29)
Calcium: 9.9 mg/dL (ref 8.7–10.2)
Chloride: 102 mmol/L (ref 96–106)
Creatinine, Ser: 0.95 mg/dL (ref 0.57–1.00)
Globulin, Total: 2.6 g/dL (ref 1.5–4.5)
Glucose: 158 mg/dL — ABNORMAL HIGH (ref 70–99)
Potassium: 3.9 mmol/L (ref 3.5–5.2)
Sodium: 141 mmol/L (ref 134–144)
Total Protein: 7.2 g/dL (ref 6.0–8.5)
eGFR: 76 mL/min/{1.73_m2} (ref >59)

## 2021-08-04 LAB — LIPID PANEL
Chol/HDL Ratio: 3.2 ratio (ref 0.0–4.4)
Cholesterol, Total: 125 mg/dL (ref 100–199)
HDL: 39 mg/dL — ABNORMAL LOW (ref >39)
LDL Chol Calc (NIH): 48 mg/dL (ref 0–99)
Triglycerides: 238 mg/dL — ABNORMAL HIGH (ref 0–149)
VLDL Cholesterol Cal: 38 mg/dL (ref 5–40)

## 2021-08-04 LAB — ESTROGENS, TOTAL: Estrogen: 52 pg/mL

## 2021-08-04 LAB — TSH: TSH: 2.33 u[IU]/mL (ref 0.450–4.500)

## 2021-08-04 LAB — T4, FREE: Free T4: 1.36 ng/dL (ref 0.82–1.77)

## 2021-08-05 DIAGNOSIS — L219 Seborrheic dermatitis, unspecified: Secondary | ICD-10-CM | POA: Diagnosis not present

## 2021-08-05 DIAGNOSIS — L299 Pruritus, unspecified: Secondary | ICD-10-CM | POA: Diagnosis not present

## 2021-08-05 DIAGNOSIS — L810 Postinflammatory hyperpigmentation: Secondary | ICD-10-CM | POA: Diagnosis not present

## 2021-08-05 DIAGNOSIS — L821 Other seborrheic keratosis: Secondary | ICD-10-CM | POA: Diagnosis not present

## 2021-08-06 LAB — DRUG TOX MONITOR 1 W/CONF, ORAL FLD
Amphetamines: NEGATIVE ng/mL (ref <10)
Barbiturates: NEGATIVE ng/mL (ref <10)
Benzodiazepines: NEGATIVE ng/mL (ref <0.50)
Buprenorphine: NEGATIVE ng/mL (ref <0.10)
Cocaine: NEGATIVE ng/mL (ref <5.0)
Codeine: NEGATIVE ng/mL (ref <2.5)
Cotinine: >250 ng/mL — ABNORMAL HIGH (ref <5.0)
Dihydrocodeine: 5 ng/mL — ABNORMAL HIGH (ref <2.5)
Fentanyl: NEGATIVE ng/mL (ref <0.10)
Heroin Metabolite: NEGATIVE ng/mL (ref <1.0)
Hydrocodone: 51.5 ng/mL — ABNORMAL HIGH (ref <2.5)
Hydromorphone: NEGATIVE ng/mL (ref <2.5)
MARIJUANA: NEGATIVE ng/mL (ref <2.5)
MDMA: NEGATIVE ng/mL (ref <10)
Meprobamate: NEGATIVE ng/mL (ref <2.5)
Methadone: NEGATIVE ng/mL (ref <5.0)
Morphine: NEGATIVE ng/mL (ref <2.5)
Nicotine Metabolite: POSITIVE ng/mL — AB (ref <5.0)
Norhydrocodone: 2.5 ng/mL — ABNORMAL HIGH (ref <2.5)
Noroxycodone: NEGATIVE ng/mL (ref <2.5)
Opiates: POSITIVE ng/mL — AB (ref <2.5)
Oxycodone: NEGATIVE ng/mL (ref <2.5)
Oxymorphone: NEGATIVE ng/mL (ref <2.5)
Phencyclidine: NEGATIVE ng/mL (ref <10)
Tapentadol: NEGATIVE ng/mL (ref <5.0)
Tramadol: NEGATIVE ng/mL (ref <5.0)
Zolpidem: NEGATIVE ng/mL (ref <5.0)

## 2021-08-06 LAB — DRUG TOX ALC METAB W/CON, ORAL FLD: Alcohol Metabolite: NEGATIVE ng/mL (ref <25)

## 2021-08-09 ENCOUNTER — Telehealth: Payer: Self-pay | Admitting: *Deleted

## 2021-08-09 ENCOUNTER — Ambulatory Visit: Payer: Medicaid Other | Admitting: Orthopedic Surgery

## 2021-08-09 NOTE — Telephone Encounter (Signed)
Oral swab drug screen was consistent for prescribed medications.  ?

## 2021-08-12 DIAGNOSIS — G5603 Carpal tunnel syndrome, bilateral upper limbs: Secondary | ICD-10-CM | POA: Diagnosis not present

## 2021-08-12 DIAGNOSIS — I10 Essential (primary) hypertension: Secondary | ICD-10-CM | POA: Diagnosis not present

## 2021-08-12 DIAGNOSIS — M1711 Unilateral primary osteoarthritis, right knee: Secondary | ICD-10-CM | POA: Diagnosis not present

## 2021-08-12 DIAGNOSIS — G8929 Other chronic pain: Secondary | ICD-10-CM | POA: Diagnosis not present

## 2021-08-12 DIAGNOSIS — Z7409 Other reduced mobility: Secondary | ICD-10-CM | POA: Diagnosis not present

## 2021-08-15 ENCOUNTER — Other Ambulatory Visit: Payer: Self-pay

## 2021-08-15 ENCOUNTER — Encounter: Payer: Self-pay | Admitting: Cardiology

## 2021-08-15 ENCOUNTER — Ambulatory Visit: Payer: Medicaid Other | Admitting: Cardiology

## 2021-08-15 VITALS — BP 154/73 | HR 91 | Temp 98.4°F | Resp 16 | Ht 62.0 in | Wt 272.0 lb

## 2021-08-15 DIAGNOSIS — R9439 Abnormal result of other cardiovascular function study: Secondary | ICD-10-CM | POA: Diagnosis not present

## 2021-08-15 DIAGNOSIS — I10 Essential (primary) hypertension: Secondary | ICD-10-CM

## 2021-08-15 DIAGNOSIS — E119 Type 2 diabetes mellitus without complications: Secondary | ICD-10-CM | POA: Diagnosis not present

## 2021-08-15 DIAGNOSIS — R0609 Other forms of dyspnea: Secondary | ICD-10-CM | POA: Diagnosis not present

## 2021-08-15 DIAGNOSIS — Z794 Long term (current) use of insulin: Secondary | ICD-10-CM | POA: Diagnosis not present

## 2021-08-15 DIAGNOSIS — E782 Mixed hyperlipidemia: Secondary | ICD-10-CM | POA: Insufficient documentation

## 2021-08-15 DIAGNOSIS — R079 Chest pain, unspecified: Secondary | ICD-10-CM | POA: Diagnosis not present

## 2021-08-15 NOTE — Progress Notes (Signed)
Patient referred by Vevelyn Francois, NP for congestive heart failure  Subjective:   Jill Shaw, female    DOB: [birth year], 45 y.o.   MRN: [id number]   Chief Complaint  Patient presents with   Chest Pain   Follow-up    4-6 week     HPI  45 year old African-American female with hypertension, type 2 diabetes mellitus, HFpEF, moderate persistent asthma, tobacco dependence, morbid obesity, microcytic anemia, uterine dysfunction  Patient has continued to have chest pain symptoms on daily basis.  She also reports 3 pillow orthopnea.  On a separate note, she reports to me that she has had, what she calls as "strokes ".  She reportedly has slurring of speech with lips feeling numb.  These episodes have been going on for several years, occur once-twice a month.   Current Outpatient Medications on File Prior to Visit  Medication Sig Dispense Refill   Accu-Chek Softclix Lancets lancets USE AS DIRECTED UP TO FOUR TIMES DAILY 100 each 3   albuterol (PROVENTIL) (2.5 MG/3ML) 0.083% nebulizer solution Take 3 mLs (2.5 mg total) by nebulization every 6 (six) hours as needed for wheezing or shortness of breath. 150 mL 6   albuterol (VENTOLIN HFA) 108 (90 Base) MCG/ACT inhaler Inhale 2 puffs into the lungs every 6 (six) hours as needed for wheezing or shortness of breath. 8 g 6   aspirin EC 81 MG tablet Take 1 tablet (81 mg total) by mouth daily. 90 tablet 3   blood glucose meter kit and supplies KIT 1 each by Other route See admin instructions. Dispense based on patient and insurance preference. Use up to four times daily as directed. (FOR ICD-9 250.00, 250.01). 1 each 2   Blood Pressure Monitoring (ADULT BLOOD PRESSURE CUFF LG) KIT 1 kit by Does not apply route daily. 1 kit 0   budesonide-formoterol (SYMBICORT) 160-4.5 MCG/ACT inhaler INHALE 2 PUFFS INTO THE LUNGS 2 (TWO) TIMES DAILY. 10.2 g 6   cetirizine (ZYRTEC) 10 MG tablet Take 1 tablet (10 mg total) by mouth daily. 30 tablet 3    Continuous Blood Gluc Sensor (DEXCOM G6 SENSOR) MISC 1 Device by Does not apply route as directed. 9 each 3   Continuous Blood Gluc Transmit (DEXCOM G6 TRANSMITTER) MISC 1 Device by Does not apply route as directed. 1 each 3   Dexlansoprazole (DEXILANT) 30 MG capsule Take 1 capsule (30 mg total) by mouth daily. 30 capsule 11   dicyclomine (BENTYL) 20 MG tablet Take 1 tablet (20 mg total) by mouth 3 (three) times daily before meals. 90 tablet 11   empagliflozin (JARDIANCE) 25 MG TABS tablet Take 1 tablet (25 mg total) by mouth daily before breakfast. 90 tablet 3   ferrous sulfate 325 (65 FE) MG tablet Take 1 tablet (325 mg total) by mouth 3 (three) times daily with meals. 270 tablet 3   fluticasone (FLONASE) 50 MCG/ACT nasal spray Place 2 sprays into both nostrils daily. 16 g 6   furosemide (LASIX) 40 MG tablet TAKE 1 TABLET(40 MG) BY MOUTH TWICE DAILY (Patient taking differently: Take 40 mg by mouth 2 (two) times daily.) 180 tablet 1   Glucosamine Sulfate 1000 MG CAPS Take 1 capsule (1,000 mg total) by mouth 2 (two) times daily. (Patient taking differently: Take 1,000 mg by mouth 2 (two) times daily.) 180 capsule 3   glucose blood (ACCU-CHEK GUIDE) test strip USE AS DIRECTED UP TO FOUR TIMES DAILY 100 strip 4   Heating Pads (HEATING PAD MOIST/DRY  KING SZ) PADS 1 each by Does not apply route 4 (four) times daily as needed. 120 each 1   hydrochlorothiazide (MICROZIDE) 12.5 MG capsule TAKE 1 CAPSULE(12.5 MG) BY MOUTH DAILY 30 capsule 3   HYDROcodone-acetaminophen (NORCO) 10-325 MG tablet Take 1 tablet by mouth 3 (three) times daily as needed. Do Not Fill Before 07/28/2021: 90 tablet 0   hydrocortisone 2.5 % cream Apply topically 2 (two) times daily. (Patient taking differently: Apply 1 application topically 2 (two) times daily.) 30 g 11   hydroquinone 4 % cream APPLY TOPICALLY TWICE DAILY (Patient taking differently: Apply 1 application topically 2 (two) times daily as needed (pain).) 28.35 g 0   insulin  glargine (LANTUS SOLOSTAR) 100 UNIT/ML Solostar Pen Inject 110 Units into the skin daily. 105 mL 3   insulin lispro (HUMALOG KWIKPEN) 200 UNIT/ML KwikPen Max daily 180 units 90 mL 3   Insulin Pen Needle 31G X 8 MM MISC 1 Device by Does not apply route in the morning, at noon, in the evening, and at bedtime. 400 each 3   isosorbide mononitrate (IMDUR) 30 MG 24 hr tablet Take 1 tablet (30 mg total) by mouth daily. 90 tablet 3   losartan (COZAAR) 100 MG tablet Take 1 tablet (100 mg total) by mouth daily. TAKE 1 TABLET($RemoveBefor'50MG'qAAftSxwlvRv$  TOTAL) BY MOUTH EVERY DAY 30 tablet 3   metoprolol succinate (TOPROL-XL) 100 MG 24 hr tablet Take 100 mg by mouth daily. Take with or immediately following a meal.     norethindrone (AYGESTIN) 5 MG tablet Take 3 tablets (15 mg total) by mouth 3 (three) times daily. With bleeding 270 tablet 0   ondansetron (ZOFRAN ODT) 4 MG disintegrating tablet Take 1 tablet (4 mg total) by mouth every 8 (eight) hours as needed for nausea or vomiting. 10 tablet 0   potassium chloride SA (KLOR-CON) 20 MEQ tablet TAKE 1 TABLET(20 MEQ) BY MOUTH DAILY (Patient taking differently: Take 20 mEq by mouth daily.) 30 tablet 6   rosuvastatin (CRESTOR) 20 MG tablet Take 1 tablet (20 mg total) by mouth daily. 30 tablet 3   saxagliptin HCl (ONGLYZA) 5 MG TABS tablet Take 1 tablet (5 mg total) by mouth daily. 90 tablet 3   senna-docusate (SENOKOT-S) 8.6-50 MG tablet Take 1 tablet by mouth at bedtime as needed for mild constipation. 30 tablet 0   spironolactone (ALDACTONE) 50 MG tablet TAKE 1 TABLET(50 MG) BY MOUTH DAILY (Patient taking differently: Take 50 mg by mouth daily.) 30 tablet 3   sucralfate (CARAFATE) 1 GM/10ML suspension Take 10 mLs (1 g total) by mouth 4 (four) times daily. 3600 mL 3   Tiotropium Bromide Monohydrate (SPIRIVA RESPIMAT) 2.5 MCG/ACT AERS Inhale 2 puffs into the lungs daily. 4 g 6   tiZANidine (ZANAFLEX) 4 MG tablet Take 1.5 tablets (6 mg total) by mouth every 6 (six) hours as needed for  muscle spasms. 120 tablet 3   TOBRADEX ophthalmic solution Place 1 drop into the right eye 4 (four) times daily.     vitamin E (VITAMIN E) 180 MG (400 UNITS) capsule Take 1 capsule (400 Units total) by mouth daily. 30 capsule 1   No current facility-administered medications on file prior to visit.    Cardiovascular and other pertinent studies:  Echocardiogram 06/01/2021:  Left ventricle cavity is normal in size. Mild concentric hypertrophy of  the left ventricle. Hyperdynamic LV systolic function >50%. Patient is  tachycardic throughout the day. Indeterminate diastolic filling pattern.  No significant valvular abnormality.  Normal right  atrial pressure.  Lexiscan/midified Bruce Tetrofosmin stress test 05/23/2021: Lexiscan/modified Bruce nuclear stress test performed using 1-day protocol. SPECT images show very small sized, mild intensity, inferior apical myocardium with mild reversibility. Stress LVEF calculated 40-45%, although visually appears 50-55%. Low risk study.  EKG 10/14/2020: Sinus tachycardia 111 bpm  Cannot exclude old anteroseptal infarct  EKG 09/01/2020: Sinus rhythm 85 bpm  Poor R wave progression Otherwise normal EKG  Vascular US 07/16/2020: No DVT  Echocardiogram 04/30/2020: 1. Left ventricular ejection fraction, by estimation, is 55 to 60%. The  left ventricle has normal function. The left ventricle has no regional  wall motion abnormalities. There is moderate concentric left ventricular  hypertrophy. Left ventricular  diastolic parameters are consistent with Grade I diastolic dysfunction  (impaired relaxation). Elevated left ventricular end-diastolic pressure.   2. Right ventricular systolic function is normal. The right ventricular  size is normal.   3. The mitral valve is normal in structure. Trivial mitral valve  regurgitation. No evidence of mitral stenosis.   4. The aortic valve is normal in structure. Aortic valve regurgitation is  not visualized. No  aortic stenosis is present.   5. The inferior vena cava is dilated in size with <50% respiratory  variability, suggesting right atrial pressure of 15 mmHg.    Recent labs: 07/29/2021: Glucose 158, BUN/Cr 10/0.99. EGFR 76. Na/K 144/3.9. AlKP 126. Rest of the CMP normal H/H 11.9/38. MCV 74. Platelets 271 HbA1C N/A Chol 125, TG 238, HDL 39, LDL 48 TSH 2.3 normal  06/14/2021: Chol 158, TG 171, HDL 26, LDL 92  12/26/2020: Glucose 243, BUN/Cr 10/1.17. EGFR 49. Na/K 134/4.6 Trop HS 5,4 H/H 9.8/32.3. MCV 74. Platelets 313 HbA1C 9.7%  07/26/2020: Glucose 160, BUN/Cr 10/1.13. EGFR 60. Na/K 140/4.0. Rest of the CMP normal H/H 9.5/34.7. MCV 70. Platelets 311 HbA1C 9.2% Chol 135, TG 166, HDL 29, LDL 77   Review of Systems  Cardiovascular: Positive for chest pain, dyspnea on exertion, leg swelling and orthopnea. Negative for palpitations and syncope.         Vitals:   08/15/21 1054  BP: (!) 154/73  Pulse: 91  Resp: 16  Temp: 98.4 F (36.9 C)  SpO2: 98%    Body mass index is 49.75 kg/m. Filed Weights   08/15/21 1054  Weight: 272 lb (123.4 kg)   Review of Systems  Cardiovascular:  Positive for chest pain and dyspnea on exertion. Negative for leg swelling, palpitations and syncope.  Psychiatric/Behavioral:         Tearful    Objective:   Physical Exam Physical Exam Vitals and nursing note reviewed.  Constitutional:      General: She is not in acute distress.    Appearance: She is obese.  Neck:     Vascular: No JVD.  Cardiovascular:     Rate and Rhythm: Normal rate and regular rhythm.     Heart sounds: Normal heart sounds. No murmur heard. Pulmonary:     Effort: Pulmonary effort is normal.     Breath sounds: Normal breath sounds. No wheezing or rales.  Musculoskeletal:     Right lower leg: No edema.     Left lower leg: No edema.          Assessment & Recommendations:   45 year old African-American female with hypertension, type 2 diabetes mellitus,  HFpEF, moderate persistent asthma, tobacco dependence, morbid obesity, microcytic anemia, uterine dysfunction  Exertional chest pain, dyspnea: Mild ischemia on stress testing, low risk study. Continue aspirin, statin, metoprolol, Imdur.  Given persistence of symptoms on antianginal therapy and with recent worsening, recommend coronary angiography and possible intervention.  Hypertension: No clear syncope.  Orthostatics are negative.  Dizziness likely related to uncontrolled hypertension. Increase losartan to 100 mg daily. Check BMP in 1 week.  Chronic heart failure with preserved ejection fraction (HCC) Currently euvolumic.  Physical exam underwhelming to explain patient's 3 pillow orthopnea. Will obtain LVEDP measurement on upcoming coronary angiography. Continue current medical management, low salt diet  Controlled type 2 diabetes mellitus: Needs aggressive management. Defer to PCP.  I strongly encourage patient to call 911, should she have one of her slurred speech episodes.  I am not convinced that these are stroke events.  I encouraged her to talk to her PCP regarding neurology referral.  F/u after coronary angiography    Nigel Mormon, MD Pager: [phone] Office: [phone]

## 2021-08-16 ENCOUNTER — Encounter (HOSPITAL_COMMUNITY): Payer: Self-pay

## 2021-08-16 ENCOUNTER — Other Ambulatory Visit: Payer: Self-pay | Admitting: Nurse Practitioner

## 2021-08-16 ENCOUNTER — Telehealth (HOSPITAL_COMMUNITY): Payer: Self-pay

## 2021-08-16 DIAGNOSIS — E559 Vitamin D deficiency, unspecified: Secondary | ICD-10-CM

## 2021-08-16 NOTE — Telephone Encounter (Signed)
Attempted to call patient in regards to Pulmonary Rehab - LM on VM Mailed letter 

## 2021-08-16 NOTE — Telephone Encounter (Signed)
Pt insurance is active and benefits verified through [facility] Medicaid Co-pay 0, DED 0/0 met, out of pocket 0/0 met, co-insurance 0%. no pre-authorization required. [place]/[employer] 08/16/2021_0 :05pm, REF# [id number]  Will contact patient to see if she is interested in the Cardiac Rehab Program. If interested, patient will need to complete follow up appt. Once completed, patient will be contacted for scheduling upon review by the RN Navigator.

## 2021-08-17 ENCOUNTER — Telehealth: Payer: Self-pay

## 2021-08-17 ENCOUNTER — Emergency Department (HOSPITAL_COMMUNITY)
Admission: EM | Admit: 2021-08-17 | Discharge: 2021-08-17 | Disposition: A | Discharge status: Home / Self Care | Payer: Medicaid Other | Attending: Emergency Medicine | Admitting: Emergency Medicine

## 2021-08-17 ENCOUNTER — Other Ambulatory Visit: Payer: Self-pay

## 2021-08-17 ENCOUNTER — Encounter (HOSPITAL_COMMUNITY): Payer: Self-pay | Admitting: Emergency Medicine

## 2021-08-17 DIAGNOSIS — E119 Type 2 diabetes mellitus without complications: Secondary | ICD-10-CM | POA: Insufficient documentation

## 2021-08-17 DIAGNOSIS — Z794 Long term (current) use of insulin: Secondary | ICD-10-CM | POA: Diagnosis not present

## 2021-08-17 DIAGNOSIS — F17210 Nicotine dependence, cigarettes, uncomplicated: Secondary | ICD-10-CM | POA: Diagnosis not present

## 2021-08-17 DIAGNOSIS — I5032 Chronic diastolic (congestive) heart failure: Secondary | ICD-10-CM | POA: Diagnosis not present

## 2021-08-17 DIAGNOSIS — I110 Hypertensive heart disease with heart failure: Secondary | ICD-10-CM | POA: Insufficient documentation

## 2021-08-17 DIAGNOSIS — E860 Dehydration: Secondary | ICD-10-CM | POA: Diagnosis not present

## 2021-08-17 DIAGNOSIS — Z79899 Other long term (current) drug therapy: Secondary | ICD-10-CM | POA: Diagnosis not present

## 2021-08-17 DIAGNOSIS — R110 Nausea: Secondary | ICD-10-CM | POA: Diagnosis not present

## 2021-08-17 DIAGNOSIS — Z7984 Long term (current) use of oral hypoglycemic drugs: Secondary | ICD-10-CM | POA: Diagnosis not present

## 2021-08-17 DIAGNOSIS — R739 Hyperglycemia, unspecified: Secondary | ICD-10-CM | POA: Diagnosis not present

## 2021-08-17 DIAGNOSIS — R1110 Vomiting, unspecified: Secondary | ICD-10-CM | POA: Diagnosis not present

## 2021-08-17 DIAGNOSIS — R109 Unspecified abdominal pain: Secondary | ICD-10-CM | POA: Diagnosis present

## 2021-08-17 DIAGNOSIS — Z20822 Contact with and (suspected) exposure to covid-19: Secondary | ICD-10-CM | POA: Diagnosis not present

## 2021-08-17 DIAGNOSIS — G8929 Other chronic pain: Secondary | ICD-10-CM | POA: Insufficient documentation

## 2021-08-17 DIAGNOSIS — R000 Tachycardia, unspecified: Secondary | ICD-10-CM | POA: Diagnosis not present

## 2021-08-17 DIAGNOSIS — Z7982 Long term (current) use of aspirin: Secondary | ICD-10-CM | POA: Insufficient documentation

## 2021-08-17 DIAGNOSIS — J449 Chronic obstructive pulmonary disease, unspecified: Secondary | ICD-10-CM | POA: Insufficient documentation

## 2021-08-17 DIAGNOSIS — Z7951 Long term (current) use of inhaled steroids: Secondary | ICD-10-CM | POA: Diagnosis not present

## 2021-08-17 DIAGNOSIS — R1031 Right lower quadrant pain: Secondary | ICD-10-CM | POA: Diagnosis not present

## 2021-08-17 DIAGNOSIS — K219 Gastro-esophageal reflux disease without esophagitis: Secondary | ICD-10-CM | POA: Diagnosis not present

## 2021-08-17 DIAGNOSIS — R1084 Generalized abdominal pain: Secondary | ICD-10-CM | POA: Insufficient documentation

## 2021-08-17 DIAGNOSIS — J45909 Unspecified asthma, uncomplicated: Secondary | ICD-10-CM | POA: Insufficient documentation

## 2021-08-17 DIAGNOSIS — R197 Diarrhea, unspecified: Secondary | ICD-10-CM | POA: Insufficient documentation

## 2021-08-17 LAB — I-STAT BETA HCG BLOOD, ED (MC, WL, AP ONLY): I-stat hCG, quantitative: <5 m[IU]/mL (ref <5)

## 2021-08-17 LAB — URINALYSIS, ROUTINE W REFLEX MICROSCOPIC
Bilirubin Urine: NEGATIVE
Glucose, UA: 150 mg/dL — AB
Hgb urine dipstick: NEGATIVE
Ketones, ur: NEGATIVE mg/dL
Leukocytes,Ua: NEGATIVE
Nitrite: NEGATIVE
Protein, ur: NEGATIVE mg/dL
Specific Gravity, Urine: 1.025 (ref 1.005–1.030)
pH: 5 (ref 5.0–8.0)

## 2021-08-17 LAB — CBC
HCT: 39.8 % (ref 36.0–46.0)
Hemoglobin: 12.7 g/dL (ref 12.0–15.0)
MCH: 23 pg — ABNORMAL LOW (ref 26.0–34.0)
MCHC: 31.9 g/dL (ref 30.0–36.0)
MCV: 72 fL — ABNORMAL LOW (ref 80.0–100.0)
Platelets: 250 10*3/uL (ref 150–400)
RBC: 5.53 MIL/uL — ABNORMAL HIGH (ref 3.87–5.11)
RDW: 22.5 % — ABNORMAL HIGH (ref 11.5–15.5)
WBC: 14.1 10*3/uL — ABNORMAL HIGH (ref 4.0–10.5)
nRBC: 0 % (ref 0.0–0.2)

## 2021-08-17 LAB — COMPREHENSIVE METABOLIC PANEL
ALT: 14 U/L (ref 0–44)
AST: 30 U/L (ref 15–41)
Albumin: 4.2 g/dL (ref 3.5–5.0)
Alkaline Phosphatase: 104 U/L (ref 38–126)
Anion gap: 15 (ref 5–15)
BUN: 18 mg/dL (ref 6–20)
CO2: 19 mmol/L — ABNORMAL LOW (ref 22–32)
Calcium: 9.6 mg/dL (ref 8.9–10.3)
Chloride: 100 mmol/L (ref 98–111)
Creatinine, Ser: 1.45 mg/dL — ABNORMAL HIGH (ref 0.44–1.00)
GFR, Estimated: 46 mL/min — ABNORMAL LOW (ref >60)
Glucose, Bld: 215 mg/dL — ABNORMAL HIGH (ref 70–99)
Potassium: 3.7 mmol/L (ref 3.5–5.1)
Sodium: 134 mmol/L — ABNORMAL LOW (ref 135–145)
Total Bilirubin: 1 mg/dL (ref 0.3–1.2)
Total Protein: 7.8 g/dL (ref 6.5–8.1)

## 2021-08-17 LAB — RESP PANEL BY RT-PCR (FLU A&B, COVID) ARPGX2
Influenza A by PCR: NEGATIVE
Influenza B by PCR: NEGATIVE
SARS Coronavirus 2 by RT PCR: NEGATIVE

## 2021-08-17 LAB — CBG MONITORING, ED: Glucose-Capillary: 325 mg/dL — ABNORMAL HIGH (ref 70–99)

## 2021-08-17 LAB — LIPASE, BLOOD: Lipase: 24 U/L (ref 11–51)

## 2021-08-17 MED ORDER — METOCLOPRAMIDE HCL 5 MG/ML IJ SOLN
10.0000 mg | Freq: Once | INTRAMUSCULAR | Status: AC
  Administered 2021-08-17: 10 mg via INTRAVENOUS
  Filled 2021-08-17: qty 2

## 2021-08-17 MED ORDER — LACTATED RINGERS IV BOLUS
1000.0000 mL | Freq: Once | INTRAVENOUS | Status: AC
  Administered 2021-08-17: 1000 mL via INTRAVENOUS

## 2021-08-17 MED ORDER — ONDANSETRON 4 MG PO TBDP: 4.0000 mg | ORAL_TABLET | Freq: Three times a day (TID) | ORAL | 0 refills | Status: DC | PRN

## 2021-08-17 MED ORDER — PROMETHAZINE HCL 25 MG PO TABS: 25.0000 mg | ORAL_TABLET | Freq: Four times a day (QID) | ORAL | 0 refills | Status: DC | PRN

## 2021-08-17 MED ORDER — LACTATED RINGERS IV BOLUS: 1000.0000 mL | Freq: Once | INTRAVENOUS | Status: DC

## 2021-08-17 MED ORDER — HYDROMORPHONE HCL 1 MG/ML IJ SOLN
1.0000 mg | Freq: Once | INTRAMUSCULAR | Status: AC
  Administered 2021-08-17: 1 mg via INTRAVENOUS
  Filled 2021-08-17: qty 1

## 2021-08-17 MED ORDER — HYDROCODONE-ACETAMINOPHEN 5-325 MG PO TABS
2.0000 | ORAL_TABLET | Freq: Once | ORAL | Status: AC
  Administered 2021-08-17: 2 via ORAL
  Filled 2021-08-17: qty 2

## 2021-08-17 NOTE — ED Triage Notes (Signed)
Per EMS, pt from home, N and V w/ generalized abd pain.  Pt feels warm to touch, generalized body aches (10/10).  Pt ambulated to truck  CBG 407 170palpated 118pulse 98% RA 22RR

## 2021-08-17 NOTE — Telephone Encounter (Signed)
Tizanidine to be refill

## 2021-08-17 NOTE — Discharge Instructions (Addendum)
If you develop worsening, continued, or recurrent abdominal pain, uncontrolled vomiting, fever, chest or back pain, or any other new/concerning symptoms then return to the ER for evaluation.  

## 2021-08-17 NOTE — ED Provider Notes (Signed)
[facility] EMERGENCY DEPARTMENT Provider Note   CSN: [id number] Arrival date & time: 08/17/21  0630     History No chief complaint on file.   Jill Shaw is a 45 y.o. female.  HPI 45 year old female presents with abdominal pain, vomiting, diarrhea.  Started yesterday with vomiting and abdominal pain and now has some loose stools today.  No fevers but she is felt hot and cold.  Abdominal pain is mostly left upper and left lower.  Similar to many prior flares of abdominal pain.  Pain is rated as severe and feels like a burning sensation.  When asked, this feels similar to many prior painful episodes that she has had with her abdominal pain but it is stronger.  No one has been able to figure out why she is having pain.  Some low back discomfort.  No blood in the stools or emesis.  Past Medical History:  Diagnosis Date   Anemia    Arthritis    knees, hands   Asthma    Chronic diastolic (congestive) heart failure (HCC)    COPD (chronic obstructive pulmonary disease) (HCC)    Diabetes mellitus without complication ([place])    type 2   DKA (diabetic ketoacidosis) ([place]) 10/15/2020   Dysfunctional uterine bleeding    GERD (gastroesophageal reflux disease)    Hypertension    Neuromuscular disorder (HCC)    neuropathy feet   Seizures ([place]) 09/12/2017   pt states r/t stress and blood sugar - no meds last one 4 months ago, not seen neurologist   Sickle cell trait ([place])    Smoker    Vitamin D deficiency 10/2019   Wears glasses     Patient Active Problem List   Diagnosis Date Noted   Mixed hyperlipidemia 08/15/2021   Abnormal stress test 08/15/2021   DM (diabetes mellitus), type 2, uncontrolled, with renal complications 40/98/1191   Intractable nausea and vomiting 03/23/2021   Serotonin syndrome 03/14/2021   Overdose, undetermined intent, initial encounter 03/14/2021   Dyslipidemia 02/16/2021   Insulin resistance 02/16/2021   Type 2 diabetes mellitus without  complication, with long-term current use of insulin ([place]) 02/16/2021   Amitriptyline overdose, accidental or unintentional, initial encounter 01/09/2021   Ingestion of substance, undetermined intent, initial encounter 01/08/2021   Hyperglycemia due to type 2 diabetes mellitus ([place]) 01/08/2021   SIRS (systemic inflammatory response syndrome) ([place]) 01/08/2021   Exertional chest pain 12/31/2020   SOB (shortness of breath) 12/24/2020   Hyperkalemia 10/15/2020   Sinus tachycardia 10/14/2020   Precordial pain 10/14/2020   Abnormal findings on diagnostic imaging of lung 08/19/2020   Physical deconditioning 08/19/2020   History of endometrial ablation 07/27/2020   History of alcohol use 07/20/2020   Current moderate episode of major depressive disorder without prior episode ([place]) 07/20/2020   Sickle cell trait ([place]) 07/19/2020   Elevated d-dimer 07/13/2020   Healthcare maintenance 07/12/2020   Exertional dyspnea 07/12/2020   Atypical chest pain 07/12/2020   At risk for obstructive sleep apnea 07/12/2020   Acute on chronic diastolic (congestive) heart failure ([place]) 06/22/2020   Generalized abdominal pain 03/08/2020   Chronic diastolic CHF (congestive heart failure) ([place]) 03/08/2020   Chronic anemia 12/19/2019   Elevated sed rate 11/26/2019   Elevated C-reactive protein (CRP) 11/26/2019   Hypoglycemia 02/07/2019   Lactic acidosis 02/07/2019   Right ankle pain 02/07/2019   Chronic obstructive pulmonary disease ([place]) 02/05/2019   Vitamin D deficiency 05/31/2017   Gastroesophageal reflux disease 05/24/2017   Abnormal  uterine bleeding (AUB) 05/17/2017   Anemia of chronic disease 04/06/2017   Knee pain, chronic 03/27/2016   Controlled type 2 diabetes mellitus without complication, with long-term current use of insulin ([place]) 03/27/2016   Essential hypertension 03/27/2016   Obesity, Class III, BMI 40-49.9 (morbid obesity) ([place]) 03/27/2016   Irritable bowel syndrome with constipation 03/27/2016    Tobacco dependence 03/27/2016   Arthropathy, lower leg 05/04/2013   CTS (carpal tunnel syndrome) 05/04/2013   Chronic pain 05/13/2012   Diabetic gastroparesis ([place]) 11/06/2006   Hot flashes 11/06/2006    Past Surgical History:  Procedure Laterality Date   CESAREAN SECTION     x 1. for twins   DILATION AND CURETTAGE OF UTERUS N/A 08/20/2019   Procedure: DILATATION AND CURETTAGE;  Surgeon: Emily Filbert, MD;  Location: [facility];  Service: Gynecology;  Laterality: N/A;   ENDOMETRIAL ABLATION N/A 08/20/2019   Procedure: [place] Ablation;  Surgeon: Emily Filbert, MD;  Location: [facility];  Service: Gynecology;  Laterality: N/A;   EYE SURGERY Bilateral    laser right and cataract removed left eye   RADIOLOGY WITH ANESTHESIA N/A 09/16/2019   Procedure: MRI WITH ANESTHESIA   L SPINE WITHOUT CONTRAST, T SPINE WITHOUT CONTRAST , CERVICAL WITHOUT CONTRAST;  Surgeon: Radiologist, Medication, MD;  Location: [facility];  Service: Radiology;  Laterality: N/A;   TUBAL LIGATION     interval BTL   UPPER GI ENDOSCOPY  07/2017     OB History     Gravida  3   Para      Term      Preterm      AB      Living  3      SAB      IAB      Ectopic      Multiple      Live Births  3        Obstetric Comments  Vaginal x 1 and then c-section for twins         Family History  Problem Relation Age of Onset   Diabetes Mother    Hypertension Mother     Social History   Tobacco Use   Smoking status: Some Days    Packs/day: 0.25    Years: 26.00    Pack years: 6.50    Types: Cigarettes    Last attempt to quit: 09/13/2020    Years since quitting: 0.9   Smokeless tobacco: Never   Tobacco comments:    4-5 cigarettes/day  Vaping Use   Vaping Use: Never used  Substance Use Topics   Alcohol use: No   Drug use: No    Home Medications Prior to Admission medications   Medication Sig Start Date End Date Taking? Authorizing Provider  ondansetron (ZOFRAN ODT) 4 MG disintegrating tablet Take 1  tablet (4 mg total) by mouth every 8 (eight) hours as needed for nausea or vomiting. 08/17/21  Yes Sherwood Gambler, MD  promethazine (PHENERGAN) 25 MG tablet Take 1 tablet (25 mg total) by mouth every 6 (six) hours as needed for nausea or vomiting. 08/17/21  Yes Sherwood Gambler, MD  Accu-Chek Softclix Lancets lancets USE AS DIRECTED UP TO FOUR TIMES DAILY 02/17/21   Vevelyn Francois, NP  albuterol (PROVENTIL) (2.5 MG/3ML) 0.083% nebulizer solution Take 3 mLs (2.5 mg total) by nebulization every 6 (six) hours as needed for wheezing or shortness of breath. 07/18/21   Margaretha Seeds, MD  albuterol (VENTOLIN HFA) [phone] Base) MCG/ACT  inhaler Inhale 2 puffs into the lungs every 6 (six) hours as needed for wheezing or shortness of breath. 07/18/21   Margaretha Seeds, MD  aspirin EC 81 MG tablet Take 1 tablet (81 mg total) by mouth daily. 07/07/21   Patwardhan, Reynold Bowen, MD  blood glucose meter kit and supplies KIT 1 each by Other route See admin instructions. Dispense based on patient and insurance preference. Use up to four times daily as directed. (FOR ICD-9 250.00, 250.01). 09/02/20   Vevelyn Francois, NP  Blood Pressure Monitoring (ADULT BLOOD PRESSURE CUFF LG) KIT 1 kit by Does not apply route daily. 05/05/21   Vevelyn Francois, NP  budesonide-formoterol (SYMBICORT) 160-4.5 MCG/ACT inhaler INHALE 2 PUFFS INTO THE LUNGS 2 (TWO) TIMES DAILY. 07/18/21   Margaretha Seeds, MD  cetirizine (ZYRTEC) 10 MG tablet Take 1 tablet (10 mg total) by mouth daily. 06/07/21   Vevelyn Francois, NP  Continuous Blood Gluc Sensor (DEXCOM G6 SENSOR) MISC 1 Device by Does not apply route as directed. 06/27/21   Shamleffer, Melanie Crazier, MD  Continuous Blood Gluc Transmit (DEXCOM G6 TRANSMITTER) MISC 1 Device by Does not apply route as directed. 06/27/21   Shamleffer, Melanie Crazier, MD  Dexlansoprazole (DEXILANT) 30 MG capsule Take 1 capsule (30 mg total) by mouth daily. 04/13/21 04/13/22  Vevelyn Francois, NP  dicyclomine (BENTYL) 20  MG tablet Take 1 tablet (20 mg total) by mouth 3 (three) times daily before meals. 06/24/21 06/24/22  Vevelyn Francois, NP  empagliflozin (JARDIANCE) 25 MG TABS tablet Take 1 tablet (25 mg total) by mouth daily before breakfast. 06/27/21   Shamleffer, Melanie Crazier, MD  ferrous sulfate 325 (65 FE) MG tablet Take 1 tablet (325 mg total) by mouth 3 (three) times daily with meals. 12/02/20 12/02/21  Vevelyn Francois, NP  fluticasone (FLONASE) 50 MCG/ACT nasal spray Place 2 sprays into both nostrils daily. 12/02/20   Vevelyn Francois, NP  furosemide (LASIX) 40 MG tablet TAKE 1 TABLET(40 MG) BY MOUTH TWICE DAILY Patient taking differently: Take 40 mg by mouth 2 (two) times daily. 02/08/21   Lorretta Harp, MD  Glucosamine Sulfate 1000 MG CAPS Take 1 capsule (1,000 mg total) by mouth 2 (two) times daily. Patient taking differently: Take 1,000 mg by mouth 2 (two) times daily. 08/22/19   Hilts, Michael, MD  glucose blood (ACCU-CHEK GUIDE) test strip USE AS DIRECTED UP TO FOUR TIMES DAILY 07/29/21   Passmore, Jake Church I, NP  Heating Pads (HEATING PAD MOIST/DRY KING SZ) PADS 1 each by Does not apply route 4 (four) times daily as needed. 02/14/21   Raulkar, Clide Deutscher, MD  hydrochlorothiazide (MICROZIDE) 12.5 MG capsule TAKE 1 CAPSULE(12.5 MG) BY MOUTH DAILY 08/03/21   Cantwell, Celeste C, PA-C  HYDROcodone-acetaminophen (NORCO) 10-325 MG tablet Take 1 tablet by mouth 3 (three) times daily as needed. Do Not Fill Before 07/28/2021: 07/25/21   Bayard Hugger, NP  hydrocortisone 2.5 % cream Apply topically 2 (two) times daily. Patient taking differently: Apply 1 application topically 2 (two) times daily. 09/22/20   Vevelyn Francois, NP  hydroquinone 4 % cream APPLY TOPICALLY TWICE DAILY Patient taking differently: Apply 1 application topically 2 (two) times daily as needed (pain). 09/13/20   Vevelyn Francois, NP  insulin glargine (LANTUS SOLOSTAR) 100 UNIT/ML Solostar Pen Inject 110 Units into the skin daily. 06/27/21    Shamleffer, Melanie Crazier, MD  insulin lispro (HUMALOG KWIKPEN) 200 UNIT/ML KwikPen Max daily 180 units 06/27/21  Shamleffer, Melanie Crazier, MD  Insulin Pen Needle 31G X 8 MM MISC 1 Device by Does not apply route in the morning, at noon, in the evening, and at bedtime. 02/16/21   Shamleffer, Melanie Crazier, MD  isosorbide mononitrate (IMDUR) 30 MG 24 hr tablet Take 1 tablet (30 mg total) by mouth daily. 07/07/21 10/05/21  Patwardhan, Reynold Bowen, MD  losartan (COZAAR) 100 MG tablet Take 1 tablet (100 mg total) by mouth daily. TAKE 1 TABLET(50MG TOTAL) BY MOUTH EVERY DAY 07/15/21 07/15/22  Patwardhan, Reynold Bowen, MD  metoprolol succinate (TOPROL-XL) 100 MG 24 hr tablet Take 100 mg by mouth daily. Take with or immediately following a meal.    [provider]  norethindrone (AYGESTIN) 5 MG tablet Take 3 tablets (15 mg total) by mouth 3 (three) times daily. With bleeding 07/25/21 08/24/21  Luvenia Redden, PA-C  potassium chloride SA (KLOR-CON) 20 MEQ tablet TAKE 1 TABLET(20 MEQ) BY MOUTH DAILY Patient taking differently: Take 20 mEq by mouth daily. 06/06/21   Lorretta Harp, MD  rosuvastatin (CRESTOR) 20 MG tablet Take 1 tablet (20 mg total) by mouth daily. 07/07/21   Patwardhan, Reynold Bowen, MD  saxagliptin HCl (ONGLYZA) 5 MG TABS tablet Take 1 tablet (5 mg total) by mouth daily. 12/02/20 12/02/21  Vevelyn Francois, NP  senna-docusate (SENOKOT-S) 8.6-50 MG tablet Take 1 tablet by mouth at bedtime as needed for mild constipation. 03/24/21   Debbe Odea, MD  spironolactone (ALDACTONE) 50 MG tablet TAKE 1 TABLET(50 MG) BY MOUTH DAILY Patient taking differently: Take 50 mg by mouth daily. 12/22/20   Patwardhan, Reynold Bowen, MD  sucralfate (CARAFATE) 1 GM/10ML suspension Take 10 mLs (1 g total) by mouth 4 (four) times daily. 05/18/21 05/18/22  Vevelyn Francois, NP  Tiotropium Bromide Monohydrate (SPIRIVA RESPIMAT) 2.5 MCG/ACT AERS Inhale 2 puffs into the lungs daily. 07/18/21   Margaretha Seeds, MD  tiZANidine  (ZANAFLEX) 4 MG tablet Take 1.5 tablets (6 mg total) by mouth every 6 (six) hours as needed for muscle spasms. 07/29/21   Bo Merino I, NP  TOBRADEX ophthalmic solution Place 1 drop into the right eye 4 (four) times daily. 04/30/21   [provider]  Vitamin D, Ergocalciferol, (DRISDOL) 1.25 MG (50000 UNIT) CAPS capsule Take 1 capsule (50,000 Units total) by mouth every Sunday for 8 doses. 08/21/21 10/10/21  Bo Merino I, NP  vitamin E (VITAMIN E) 180 MG (400 UNITS) capsule Take 1 capsule (400 Units total) by mouth daily. 01/07/21   Izora Ribas, MD    Allergies    Amitriptyline, Ketoprofen, Trazodone, Aspirin, Gabapentin, Ibuprofen, Liraglutide, Naproxen, Omeprazole-sodium bicarbonate, Sulfa antibiotics, and Tramadol  Review of Systems   Review of Systems  Constitutional:  Negative for fever.  Cardiovascular:  Negative for chest pain.  Gastrointestinal:  Positive for abdominal pain, diarrhea, nausea and vomiting. Negative for blood in stool.  Musculoskeletal:  Positive for back pain.  All other systems reviewed and are negative.  Physical Exam Updated Vital Signs BP 119/68   Pulse (!) 105   Temp 97.7 F (36.5 C) (Oral)   Resp 16   SpO2 99%   Physical Exam Vitals and nursing note reviewed.  Constitutional:      Appearance: She is well-developed. She is obese. She is not ill-appearing or diaphoretic.     Comments: Appears to be in significant pain  HENT:     Head: Normocephalic and atraumatic.     Right Ear: External ear normal.  Left Ear: External ear normal.     Nose: Nose normal.  Eyes:     General:        Right eye: No discharge.        Left eye: No discharge.  Cardiovascular:     Rate and Rhythm: Regular rhythm. Tachycardia present.     Heart sounds: Normal heart sounds.  Pulmonary:     Effort: Pulmonary effort is normal.     Breath sounds: Normal breath sounds.  Abdominal:     Palpations: Abdomen is soft.     Tenderness: There is  abdominal tenderness in the left upper quadrant and left lower quadrant.  Skin:    General: Skin is warm and dry.  Neurological:     Mental Status: She is alert.  Psychiatric:        Mood and Affect: Mood is not anxious.    ED Results / Procedures / Treatments   Labs (all labs ordered are listed, but only abnormal results are displayed) Labs Reviewed  COMPREHENSIVE METABOLIC PANEL - Abnormal; Notable for the following components:      Result Value   Sodium 134 (*)    CO2 19 (*)    Glucose, Bld 215 (*)    Creatinine, Ser 1.45 (*)    GFR, Estimated 46 (*)    All other components within normal limits  CBC - Abnormal; Notable for the following components:   WBC 14.1 (*)    RBC 5.53 (*)    MCV 72.0 (*)    MCH 23.0 (*)    RDW 22.5 (*)    All other components within normal limits  URINALYSIS, ROUTINE W REFLEX MICROSCOPIC - Abnormal; Notable for the following components:   APPearance HAZY (*)    Glucose, UA 150 (*)    All other components within normal limits  CBG MONITORING, ED - Abnormal; Notable for the following components:   Glucose-Capillary 325 (*)    All other components within normal limits  RESP PANEL BY RT-PCR (FLU A&B, COVID) ARPGX2  LIPASE, BLOOD  I-STAT BETA HCG BLOOD, ED ([facility], [facility], [facility] ONLY)    EKG EKG Interpretation  Date/Time:  Wednesday August 17 2021 07:31:26 EDT Ventricular Rate:  123 PR Interval:  138 QRS Duration: 82 QT Interval:  332 QTC Calculation: 475 R Axis:   69 Text Interpretation: Sinus tachycardia Otherwise normal ECG Confirmed by Sherwood Gambler [phone]) on 08/17/2021 2:58:19 PM  Radiology No results found.  Procedures Procedures   Medications Ordered in ED Medications  HYDROcodone-acetaminophen (NORCO/VICODIN) 5-325 MG per tablet 2 tablet (has no administration in time range)  lactated ringers bolus 1,000 mL (1,000 mLs Intravenous New Bag/Given 08/17/21 1535)  HYDROmorphone (DILAUDID) injection 1 mg (1 mg Intravenous Given 08/17/21  1529)  metoCLOPramide (REGLAN) injection 10 mg (10 mg Intravenous Given 08/17/21 1528)    ED Course  I have reviewed the triage vital signs and the nursing notes.  Pertinent labs & imaging results that were available during my care of the patient were reviewed by me and considered in my medical decision making (see chart for details).    MDM Rules/Calculators/A&P                           This appears to be a flare of her chronic abdominal pain and vomiting.  She does have evidence of some dehydration with a mildly bumped creatinine but no significant electrolyte disturbance otherwise.  Her leukocytosis is probably from  vomiting.  She is had multiple CTs for this before and I do not think she needs a repeat given she tells me this is similar pain to prior.  After Dilaudid and Reglan, she is feeling better and would like to try p.o. fluids.  She takes 10 mg hydrocodone at home so we will give her some here.  She seemed to tolerate the pills fine and is asking for a sandwich.  We will discharge her home as she is feeling significantly better and able to tolerate p.o. Final Clinical Impression(s) / ED Diagnoses Final diagnoses:  Chronic abdominal pain  Dehydration    Rx / DC Orders ED Discharge Orders          Ordered    ondansetron (ZOFRAN ODT) 4 MG disintegrating tablet  Every 8 hours PRN        08/17/21 1614    promethazine (PHENERGAN) 25 MG tablet  Every 6 hours PRN        08/17/21 1614             Sherwood Gambler, MD 08/17/21 1633

## 2021-08-18 ENCOUNTER — Telehealth: Payer: Self-pay

## 2021-08-18 ENCOUNTER — Emergency Department (HOSPITAL_COMMUNITY)
Admission: EM | Admit: 2021-08-18 | Discharge: 2021-08-19 | Disposition: A | Discharge status: Home / Self Care | Payer: Medicaid Other | Attending: Emergency Medicine | Admitting: Emergency Medicine

## 2021-08-18 ENCOUNTER — Ambulatory Visit: Payer: Medicaid Other | Admitting: Cardiology

## 2021-08-18 DIAGNOSIS — I110 Hypertensive heart disease with heart failure: Secondary | ICD-10-CM | POA: Diagnosis not present

## 2021-08-18 DIAGNOSIS — J45909 Unspecified asthma, uncomplicated: Secondary | ICD-10-CM | POA: Insufficient documentation

## 2021-08-18 DIAGNOSIS — E1110 Type 2 diabetes mellitus with ketoacidosis without coma: Secondary | ICD-10-CM | POA: Insufficient documentation

## 2021-08-18 DIAGNOSIS — J449 Chronic obstructive pulmonary disease, unspecified: Secondary | ICD-10-CM | POA: Diagnosis not present

## 2021-08-18 DIAGNOSIS — N9489 Other specified conditions associated with female genital organs and menstrual cycle: Secondary | ICD-10-CM | POA: Insufficient documentation

## 2021-08-18 DIAGNOSIS — E161 Other hypoglycemia: Secondary | ICD-10-CM | POA: Diagnosis not present

## 2021-08-18 DIAGNOSIS — K760 Fatty (change of) liver, not elsewhere classified: Secondary | ICD-10-CM | POA: Diagnosis not present

## 2021-08-18 DIAGNOSIS — R1084 Generalized abdominal pain: Secondary | ICD-10-CM | POA: Diagnosis not present

## 2021-08-18 DIAGNOSIS — K219 Gastro-esophageal reflux disease without esophagitis: Secondary | ICD-10-CM | POA: Diagnosis not present

## 2021-08-18 DIAGNOSIS — R109 Unspecified abdominal pain: Secondary | ICD-10-CM | POA: Diagnosis not present

## 2021-08-18 DIAGNOSIS — R1012 Left upper quadrant pain: Secondary | ICD-10-CM | POA: Insufficient documentation

## 2021-08-18 DIAGNOSIS — R000 Tachycardia, unspecified: Secondary | ICD-10-CM | POA: Diagnosis not present

## 2021-08-18 DIAGNOSIS — R1032 Left lower quadrant pain: Secondary | ICD-10-CM | POA: Diagnosis not present

## 2021-08-18 DIAGNOSIS — I5032 Chronic diastolic (congestive) heart failure: Secondary | ICD-10-CM | POA: Diagnosis not present

## 2021-08-18 DIAGNOSIS — R112 Nausea with vomiting, unspecified: Secondary | ICD-10-CM | POA: Insufficient documentation

## 2021-08-18 DIAGNOSIS — E162 Hypoglycemia, unspecified: Secondary | ICD-10-CM | POA: Diagnosis not present

## 2021-08-18 DIAGNOSIS — I10 Essential (primary) hypertension: Secondary | ICD-10-CM | POA: Diagnosis not present

## 2021-08-18 DIAGNOSIS — F17210 Nicotine dependence, cigarettes, uncomplicated: Secondary | ICD-10-CM | POA: Insufficient documentation

## 2021-08-18 LAB — COMPREHENSIVE METABOLIC PANEL
ALT: 14 U/L (ref 0–44)
AST: 30 U/L (ref 15–41)
Albumin: 4 g/dL (ref 3.5–5.0)
Alkaline Phosphatase: 83 U/L (ref 38–126)
Anion gap: 14 (ref 5–15)
BUN: 15 mg/dL (ref 6–20)
CO2: 21 mmol/L — ABNORMAL LOW (ref 22–32)
Calcium: 9.7 mg/dL (ref 8.9–10.3)
Chloride: 101 mmol/L (ref 98–111)
Creatinine, Ser: 1.18 mg/dL — ABNORMAL HIGH (ref 0.44–1.00)
GFR, Estimated: 58 mL/min — ABNORMAL LOW (ref >60)
Glucose, Bld: 57 mg/dL — ABNORMAL LOW (ref 70–99)
Potassium: 3 mmol/L — ABNORMAL LOW (ref 3.5–5.1)
Sodium: 136 mmol/L (ref 135–145)
Total Bilirubin: 0.9 mg/dL (ref 0.3–1.2)
Total Protein: 7.6 g/dL (ref 6.5–8.1)

## 2021-08-18 LAB — I-STAT BETA HCG BLOOD, ED (MC, WL, AP ONLY): I-stat hCG, quantitative: <5 m[IU]/mL (ref <5)

## 2021-08-18 LAB — URINALYSIS, ROUTINE W REFLEX MICROSCOPIC
Bilirubin Urine: NEGATIVE
Glucose, UA: NEGATIVE mg/dL
Hgb urine dipstick: NEGATIVE
Ketones, ur: NEGATIVE mg/dL
Leukocytes,Ua: NEGATIVE
Nitrite: NEGATIVE
Protein, ur: NEGATIVE mg/dL
Specific Gravity, Urine: 1.035 — ABNORMAL HIGH (ref 1.005–1.030)
pH: 5 (ref 5.0–8.0)

## 2021-08-18 LAB — CBC
HCT: 37.5 % (ref 36.0–46.0)
Hemoglobin: 12.2 g/dL (ref 12.0–15.0)
MCH: 22.8 pg — ABNORMAL LOW (ref 26.0–34.0)
MCHC: 32.5 g/dL (ref 30.0–36.0)
MCV: 70.2 fL — ABNORMAL LOW (ref 80.0–100.0)
Platelets: 271 10*3/uL (ref 150–400)
RBC: 5.34 MIL/uL — ABNORMAL HIGH (ref 3.87–5.11)
RDW: 22.1 % — ABNORMAL HIGH (ref 11.5–15.5)
WBC: 14.3 10*3/uL — ABNORMAL HIGH (ref 4.0–10.5)
nRBC: 0 % (ref 0.0–0.2)

## 2021-08-18 LAB — LIPASE, BLOOD: Lipase: 21 U/L (ref 11–51)

## 2021-08-18 LAB — CBG MONITORING, ED
Glucose-Capillary: 65 mg/dL — ABNORMAL LOW (ref 70–99)
Glucose-Capillary: 136 mg/dL — ABNORMAL HIGH (ref 70–99)

## 2021-08-18 NOTE — ED Triage Notes (Signed)
Pt from home c/o abd pain "several mos." Seen for same yesterday, feel pain is still not controlled. GI follow up appt scheduled  190/112 HR 102

## 2021-08-18 NOTE — ED Notes (Signed)
Patient is reporting diarreha at this time accompanied by her vomiting in the bathroom.

## 2021-08-18 NOTE — ED Notes (Signed)
Patient reports back pain/neck pain and increased abdominal pain. Threw up in bathroom and is asking for nausea/pain medication.

## 2021-08-18 NOTE — ED Notes (Addendum)
Patient is stating that she is feeling like she is "about to pass out". Nurse notified

## 2021-08-18 NOTE — Telephone Encounter (Signed)
Transition Care Management Unsuccessful Follow-up Telephone Call  Date of discharge and from where:  08/17/2021 from [facility]  Attempts:  1st Attempt  Reason for unsuccessful TCM follow-up call:  Left voice message

## 2021-08-18 NOTE — ED Notes (Signed)
CBG 65, RN and PA notified. Order oral juice as intervention. Pt was not following commands, only drank 78ml of juice. RN and PA notified

## 2021-08-19 ENCOUNTER — Emergency Department (HOSPITAL_COMMUNITY): Payer: Medicaid Other

## 2021-08-19 ENCOUNTER — Ambulatory Visit: Payer: Medicaid Other | Admitting: Orthopedic Surgery

## 2021-08-19 DIAGNOSIS — R109 Unspecified abdominal pain: Secondary | ICD-10-CM | POA: Diagnosis not present

## 2021-08-19 DIAGNOSIS — K760 Fatty (change of) liver, not elsewhere classified: Secondary | ICD-10-CM | POA: Diagnosis not present

## 2021-08-19 MED ORDER — ONDANSETRON HCL 4 MG/2ML IJ SOLN
4.0000 mg | Freq: Once | INTRAMUSCULAR | Status: AC
  Administered 2021-08-19: 4 mg via INTRAVENOUS
  Filled 2021-08-19: qty 2

## 2021-08-19 MED ORDER — IOHEXOL 300 MG/ML  SOLN
100.0000 mL | Freq: Once | INTRAMUSCULAR | Status: AC | PRN
  Administered 2021-08-19: 100 mL via INTRAVENOUS

## 2021-08-19 MED ORDER — HYDROMORPHONE HCL 1 MG/ML IJ SOLN
0.5000 mg | Freq: Once | INTRAMUSCULAR | Status: AC
  Administered 2021-08-19: 0.5 mg via INTRAVENOUS
  Filled 2021-08-19: qty 1

## 2021-08-19 MED ORDER — SODIUM CHLORIDE 0.9 % IV BOLUS
1000.0000 mL | Freq: Once | INTRAVENOUS | Status: AC
  Administered 2021-08-19: 1000 mL via INTRAVENOUS

## 2021-08-19 MED ORDER — MORPHINE SULFATE (PF) 4 MG/ML IV SOLN
4.0000 mg | Freq: Once | INTRAVENOUS | Status: AC
  Administered 2021-08-19: 4 mg via INTRAVENOUS
  Filled 2021-08-19: qty 1

## 2021-08-19 MED ORDER — SODIUM CHLORIDE 0.9 % IV BOLUS: 1000.0000 mL | Freq: Once | INTRAVENOUS | Status: DC

## 2021-08-19 MED ORDER — HALOPERIDOL LACTATE 5 MG/ML IJ SOLN
2.0000 mg | Freq: Once | INTRAMUSCULAR | Status: AC
  Administered 2021-08-19: 2 mg via INTRAVENOUS
  Filled 2021-08-19: qty 1

## 2021-08-19 NOTE — ED Notes (Signed)
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

## 2021-08-19 NOTE — Discharge Instructions (Addendum)
You were evaluated in the Emergency Department and after careful evaluation, we did not find any emergent condition requiring admission or further testing in the hospital.  Your exam/testing today was overall reassuring.  Recommend follow-up with your GI doctors for further management.  Please return to the Emergency Department if you experience any worsening of your condition.  Thank you for allowing us to be a part of your care.  

## 2021-08-19 NOTE — ED Notes (Signed)
Patient transported to CT 

## 2021-08-19 NOTE — ED Provider Notes (Signed)
Emergency Medicine Provider Triage Evaluation Note  Jill Shaw , a 45 y.o. female  was evaluated in triage.  Pt complains of abdominal pain, nausea and vomiting.  2nd visit in 2 days for the same.  States that symptoms are worsening.  Has had pain chronically and has GI follow-up.  Review of Systems  Positive: Abdominal pain, vomiting Negative: Fever, chills  Physical Exam  BP (!) 163/130 (BP Location: Right Arm)   Pulse (!) 113   Temp 99.4 F (37.4 C) (Oral)   Resp 17   SpO2 95%  Gen:   Awake, uncomfortable appearing Resp:  Normal effort  MSK:   Moves extremities without difficulty  Other:    Medical Decision Making  Medically screening exam initiated at 12:40 AM.  Appropriate orders placed.  Jill Shaw was informed that the remainder of the evaluation will be completed by another provider, this initial triage assessment does not replace that evaluation, and the importance of remaining in the ED until their evaluation is complete.  Abdominal pain   Roxy Horseman, PA-C 08/19/21 0042    Sabas Sous, MD 08/19/21 [phone]

## 2021-08-19 NOTE — ED Notes (Signed)
Pt A&OX4, pt verbalized understanding of d/c instructions and follow up care. 

## 2021-08-19 NOTE — Telephone Encounter (Signed)
Transition Care Management Unsuccessful Follow-up Telephone Call  Date of discharge and from where:  08/17/2021 from [facility]  Attempts:  2nd Attempt  Reason for unsuccessful TCM follow-up call:  Left voice message

## 2021-08-19 NOTE — ED Provider Notes (Signed)
[facility] DEPT [facility] Emergency Department Provider Note MRN:  [id number]  Arrival date & time: 08/19/21     Chief Complaint   Abdominal Pain   History of Present Illness   Jill Shaw is a 45 y.o. year-old female with a history of diabetes presenting to the ED with chief complaint of abdominal pain.  Location: Left upper and lower quadrant Duration: 2 days Onset: Gradual Timing: Constant Description: Dull ache Severity: Severe Exacerbating/Alleviating Factors: None, unrelenting Associated Symptoms: Nausea and vomiting Pertinent Negatives: No fever, no diarrhea, no chest pain or shortness of breath  Additional History: Was in the emergency department 2 days ago with the same symptoms  Review of Systems  A complete 10 system review of systems was obtained and all systems are negative except as noted in the HPI and PMH.   Patient's Health History    Past Medical History:  Diagnosis Date   Anemia    Arthritis    knees, hands   Asthma    Chronic diastolic (congestive) heart failure (HCC)    COPD (chronic obstructive pulmonary disease) (HCC)    Diabetes mellitus without complication (HCC)    type 2   DKA (diabetic ketoacidosis) (HCC) 10/15/2020   Dysfunctional uterine bleeding    GERD (gastroesophageal reflux disease)    Hypertension    Neuromuscular disorder (HCC)    neuropathy feet   Seizures (HCC) 09/12/2017   pt states r/t stress and blood sugar - no meds last one 4 months ago, not seen neurologist   Sickle cell trait (HCC)    Smoker    Vitamin D deficiency 10/2019   Wears glasses     Past Surgical History:  Procedure Laterality Date   CESAREAN SECTION     x 1. for twins   DILATION AND CURETTAGE OF UTERUS N/A 08/20/2019   Procedure: DILATATION AND CURETTAGE;  Surgeon: Allie Bossier, MD;  [facility];  Service: Gynecology;  Laterality: N/A;   ENDOMETRIAL ABLATION N/A 08/20/2019   Procedure: Minerva Ablation;  Surgeon: Allie Bossier, MD;   Location: [facility];  Service: Gynecology;  Laterality: N/A;   EYE SURGERY Bilateral    laser right and cataract removed left eye   RADIOLOGY WITH ANESTHESIA N/A 09/16/2019   Procedure: MRI WITH ANESTHESIA   L SPINE WITHOUT CONTRAST, T SPINE WITHOUT CONTRAST , CERVICAL WITHOUT CONTRAST;  Surgeon: Radiologist, Medication, MD;  Location[facility];  Service: Radiology;  Laterality: N/A;   TUBAL LIGATION     interval BTL   UPPER GI ENDOSCOPY  07/2017    Family History  Problem Relation Age of Onset   Diabetes Mother    Hypertension Mother     Social History   Socioeconomic History   Marital status: Legally Separated    Spouse name: Not on file   Number of children: 3   Years of education: Not on file   Highest education level: Not on file  Occupational History   Occupation: unemployed  Tobacco Use   Smoking status: Some Days    Packs/day: 0.25    Years: 26.00    Pack years: 6.50    Types: Cigarettes    Last attempt to quit: 09/13/2020    Years since quitting: 0.9   Smokeless tobacco: Never   Tobacco comments:    4-5 cigarettes/day  Vaping Use   Vaping Use: Never used  Substance and Sexual Activity   Alcohol use: No   Drug use: No   Sexual activity: Not Currently  Birth control/protection: None  Other Topics Concern   Not on file  Social History Narrative   Right Handed   Lives in a one story apartment, but lives on the second floor   Drinks caffeine once in awhile   Social Determinants of Health   Financial Resource Strain: Not on file  Food Insecurity: No Food Insecurity   Worried About Programme researcher, broadcasting/film/video in the Last Year: Never true   Barista in the Last Year: Never true  Transportation Needs: No Transportation Needs   Lack of Transportation (Medical): No   Lack of Transportation (Non-Medical): No  Physical Activity: Not on file  Stress: Not on file  Social Connections: Not on file  Intimate Partner Violence: Not on file     Physical Exam   Vitals:    08/19/21 0033 08/19/21 0220  BP: (!) 163/130 (!) 152/78  Pulse: (!) 113 (!) 105  Resp: 17 (!) 25  Temp: 99.4 F (37.4 C)   SpO2: 95% 100%    CONSTITUTIONAL: Well-appearing, in moderate distress due to pain NEURO:  Alert and oriented x 3, no focal deficits EYES:  eyes equal and reactive ENT/NECK:  no LAD, no JVD CARDIO: Tachycardic rate, well-perfused, normal S1 and S2 PULM:  CTAB no wheezing or rhonchi GI/GU:  normal bowel sounds, non-distended, mild diffuse tenderness MSK/SPINE:  No gross deformities, no edema SKIN:  no rash, atraumatic PSYCH:  Appropriate speech and behavior  *Additional and/or pertinent findings included in MDM below  Diagnostic and Interventional Summary    EKG Interpretation  Date/Time:  Thursday August 18 2021 21:42:21 EDT Ventricular Rate:  115 PR Interval:  142 QRS Duration: 82 QT Interval:  348 QTC Calculation: 481 R Axis:   4 Text Interpretation: Sinus tachycardia Septal infarct , age undetermined Abnormal ECG Confirmed by Kennis Carina [phone]) on 08/19/2021 1:19:22 AM       Labs Reviewed  COMPREHENSIVE METABOLIC PANEL - Abnormal; Notable for the following components:      Result Value   Potassium 3.0 (*)    CO2 21 (*)    Glucose, Bld 57 (*)    Creatinine, Ser 1.18 (*)    GFR, Estimated 58 (*)    All other components within normal limits  CBC - Abnormal; Notable for the following components:   WBC 14.3 (*)    RBC 5.34 (*)    MCV 70.2 (*)    MCH 22.8 (*)    RDW 22.1 (*)    All other components within normal limits  URINALYSIS, ROUTINE W REFLEX MICROSCOPIC - Abnormal; Notable for the following components:   APPearance HAZY (*)    Specific Gravity, Urine 1.035 (*)    All other components within normal limits  CBG MONITORING, ED - Abnormal; Notable for the following components:   Glucose-Capillary 65 (*)    All other components within normal limits  CBG MONITORING, ED - Abnormal; Notable for the following components:    Glucose-Capillary 136 (*)    All other components within normal limits  LIPASE, BLOOD  I-STAT BETA HCG BLOOD, ED ([facility], [facility], [facility] ONLY)  CBG MONITORING, ED    CT ABDOMEN PELVIS W CONTRAST  Final Result      Medications  sodium chloride 0.9 % bolus 1,000 mL (has no administration in time range)  HYDROmorphone (DILAUDID) injection 0.5 mg (has no administration in time range)  sodium chloride 0.9 % bolus 1,000 mL (0 mLs Intravenous Stopped 08/19/21 0400)  ondansetron (ZOFRAN) injection 4  mg (4 mg Intravenous Given 08/19/21 0044)  morphine 4 MG/ML injection 4 mg (4 mg Intravenous Given 08/19/21 0142)  haloperidol lactate (HALDOL) injection 2 mg (2 mg Intravenous Given 08/19/21 0218)  iohexol (OMNIPAQUE) 300 MG/ML solution 100 mL (100 mLs Intravenous Contrast Given 08/19/21 0242)     Procedures  /  Critical Care Procedures  ED Course and Medical Decision Making  I have reviewed the triage vital signs, the nursing notes, and pertinent available records from the EMR.  Listed above are laboratory and imaging tests that I personally ordered, reviewed, and interpreted and then considered in my medical decision making (see below for details).  Suspect acute on chronic abdominal pain, possibly due to gastroparesis or some type of functional etiology given the frequency of the occurrences.  However she is quite uncomfortable, did not obtain CT imaging during her last visit, will CT to exclude diverticulitis or perforated viscus.  Providing Haldol for pain and nausea.     Patient with significant improvement in symptoms after Haldol.  CT scan is reassuring without acute process.  Appropriate for discharge.  Elmer Sow. Pilar Plate, MD [facility] Emergency Medicine [facility] [email or web address]  Final Clinical Impressions(s) / ED Diagnoses     ICD-10-CM   1. Abdominal pain, unspecified abdominal location  R10.9       ED Discharge Orders     None        Discharge Instructions  Discussed with and Provided to Patient:     Discharge Instructions      You were evaluated in the Emergency Department and after careful evaluation, we did not find any emergent condition requiring admission or further testing in the hospital.  Your exam/testing today was overall reassuring.  Recommend follow-up with your GI doctors for further management.  Please return to the Emergency Department if you experience any worsening of your condition.  Thank you for allowing [place] to be a part of your care.         Sabas Sous, MD 08/19/21 [phone]

## 2021-08-20 ENCOUNTER — Emergency Department (HOSPITAL_BASED_OUTPATIENT_CLINIC_OR_DEPARTMENT_OTHER)
Admission: EM | Admit: 2021-08-20 | Discharge: 2021-08-20 | Disposition: A | Discharge status: Home / Self Care | Payer: Medicaid Other | Attending: Emergency Medicine | Admitting: Emergency Medicine

## 2021-08-20 ENCOUNTER — Other Ambulatory Visit: Payer: Self-pay

## 2021-08-20 ENCOUNTER — Encounter (HOSPITAL_BASED_OUTPATIENT_CLINIC_OR_DEPARTMENT_OTHER): Payer: Self-pay | Admitting: Emergency Medicine

## 2021-08-20 DIAGNOSIS — R1111 Vomiting without nausea: Secondary | ICD-10-CM | POA: Diagnosis not present

## 2021-08-20 DIAGNOSIS — G8929 Other chronic pain: Secondary | ICD-10-CM | POA: Insufficient documentation

## 2021-08-20 DIAGNOSIS — E1110 Type 2 diabetes mellitus with ketoacidosis without coma: Secondary | ICD-10-CM | POA: Diagnosis not present

## 2021-08-20 DIAGNOSIS — I110 Hypertensive heart disease with heart failure: Secondary | ICD-10-CM | POA: Insufficient documentation

## 2021-08-20 DIAGNOSIS — R112 Nausea with vomiting, unspecified: Secondary | ICD-10-CM

## 2021-08-20 DIAGNOSIS — Z79899 Other long term (current) drug therapy: Secondary | ICD-10-CM | POA: Diagnosis not present

## 2021-08-20 DIAGNOSIS — J45909 Unspecified asthma, uncomplicated: Secondary | ICD-10-CM | POA: Insufficient documentation

## 2021-08-20 DIAGNOSIS — J449 Chronic obstructive pulmonary disease, unspecified: Secondary | ICD-10-CM | POA: Insufficient documentation

## 2021-08-20 DIAGNOSIS — E1140 Type 2 diabetes mellitus with diabetic neuropathy, unspecified: Secondary | ICD-10-CM | POA: Diagnosis not present

## 2021-08-20 DIAGNOSIS — Z7951 Long term (current) use of inhaled steroids: Secondary | ICD-10-CM | POA: Diagnosis not present

## 2021-08-20 DIAGNOSIS — Z794 Long term (current) use of insulin: Secondary | ICD-10-CM | POA: Diagnosis not present

## 2021-08-20 DIAGNOSIS — Z7984 Long term (current) use of oral hypoglycemic drugs: Secondary | ICD-10-CM | POA: Diagnosis not present

## 2021-08-20 DIAGNOSIS — R197 Diarrhea, unspecified: Secondary | ICD-10-CM | POA: Diagnosis not present

## 2021-08-20 DIAGNOSIS — Z7982 Long term (current) use of aspirin: Secondary | ICD-10-CM | POA: Insufficient documentation

## 2021-08-20 DIAGNOSIS — R110 Nausea: Secondary | ICD-10-CM | POA: Diagnosis not present

## 2021-08-20 DIAGNOSIS — R1084 Generalized abdominal pain: Secondary | ICD-10-CM | POA: Diagnosis not present

## 2021-08-20 DIAGNOSIS — I10 Essential (primary) hypertension: Secondary | ICD-10-CM | POA: Diagnosis not present

## 2021-08-20 DIAGNOSIS — R109 Unspecified abdominal pain: Secondary | ICD-10-CM | POA: Insufficient documentation

## 2021-08-20 DIAGNOSIS — I5032 Chronic diastolic (congestive) heart failure: Secondary | ICD-10-CM | POA: Insufficient documentation

## 2021-08-20 DIAGNOSIS — F17210 Nicotine dependence, cigarettes, uncomplicated: Secondary | ICD-10-CM | POA: Insufficient documentation

## 2021-08-20 LAB — CBC WITH DIFFERENTIAL/PLATELET
Abs Immature Granulocytes: 0.05 10*3/uL (ref 0.00–0.07)
Basophils Absolute: 0.1 10*3/uL (ref 0.0–0.1)
Basophils Relative: 1 %
Eosinophils Absolute: 0.1 10*3/uL (ref 0.0–0.5)
Eosinophils Relative: 1 %
HCT: 36.3 % (ref 36.0–46.0)
Hemoglobin: 11.7 g/dL — ABNORMAL LOW (ref 12.0–15.0)
Immature Granulocytes: 0 %
Lymphocytes Relative: 21 %
Lymphs Abs: 2.6 10*3/uL (ref 0.7–4.0)
MCH: 23.2 pg — ABNORMAL LOW (ref 26.0–34.0)
MCHC: 32.2 g/dL (ref 30.0–36.0)
MCV: 71.9 fL — ABNORMAL LOW (ref 80.0–100.0)
Monocytes Absolute: 0.7 10*3/uL (ref 0.1–1.0)
Monocytes Relative: 5 %
Neutro Abs: 9 10*3/uL — ABNORMAL HIGH (ref 1.7–7.7)
Neutrophils Relative %: 72 %
Platelets: 279 10*3/uL (ref 150–400)
RBC: 5.05 MIL/uL (ref 3.87–5.11)
RDW: 22.1 % — ABNORMAL HIGH (ref 11.5–15.5)
WBC: 12.5 10*3/uL — ABNORMAL HIGH (ref 4.0–10.5)
nRBC: 0 % (ref 0.0–0.2)

## 2021-08-20 LAB — BASIC METABOLIC PANEL
Anion gap: 12 (ref 5–15)
BUN: 13 mg/dL (ref 6–20)
CO2: 23 mmol/L (ref 22–32)
Calcium: 9.8 mg/dL (ref 8.9–10.3)
Chloride: 102 mmol/L (ref 98–111)
Creatinine, Ser: 1.11 mg/dL — ABNORMAL HIGH (ref 0.44–1.00)
GFR, Estimated: >60 mL/min (ref >60)
Glucose, Bld: 68 mg/dL — ABNORMAL LOW (ref 70–99)
Potassium: 3.7 mmol/L (ref 3.5–5.1)
Sodium: 137 mmol/L (ref 135–145)

## 2021-08-20 MED ORDER — HALOPERIDOL 5 MG PO TABS: 2.5000 mg | ORAL_TABLET | Freq: Three times a day (TID) | ORAL | 0 refills | Status: DC | PRN

## 2021-08-20 MED ORDER — HYDROMORPHONE HCL 1 MG/ML IJ SOLN
1.0000 mg | Freq: Once | INTRAMUSCULAR | Status: AC
  Administered 2021-08-20: 1 mg via INTRAVENOUS
  Filled 2021-08-20: qty 1

## 2021-08-20 MED ORDER — HALOPERIDOL LACTATE 5 MG/ML IJ SOLN
2.0000 mg | Freq: Once | INTRAMUSCULAR | Status: AC
  Administered 2021-08-20: 2 mg via INTRAVENOUS
  Filled 2021-08-20: qty 1

## 2021-08-20 MED ORDER — MORPHINE SULFATE (PF) 2 MG/ML IV SOLN
2.0000 mg | Freq: Once | INTRAVENOUS | Status: AC
  Administered 2021-08-20: 2 mg via INTRAVENOUS
  Filled 2021-08-20: qty 1

## 2021-08-20 MED ORDER — LACTATED RINGERS IV BOLUS
1000.0000 mL | Freq: Once | INTRAVENOUS | Status: AC
  Administered 2021-08-20: 1000 mL via INTRAVENOUS

## 2021-08-20 NOTE — ED Provider Notes (Signed)
[facility] Provider Note: Georgena Spurling, MD, FACEP  CSN: [id number] MRN: [id number] ARRIVAL: 08/20/21 at [place]: [place]  Abdominal Pain   HISTORY OF PRESENT ILLNESS  08/20/21 4:06 AM Jill Shaw is a 45 y.o. female with a history of chronic abdominal pain on hydrocodone managed by a pain management specialist.    She is here with abdominal pain, nausea, vomiting and diarrhea that has been going on for the past several days.  She was seen here for the same on the second and third of this month.  She has had a work-up including laboratory studies and CT scan of the abdomen and pelvis that were unremarkable.  She felt better after being treated yesterday morning and was discharged home but she had a recurrence about 6 PM yesterday evening.  Should her pain is generalized, described as sharp and is rated as a 10 out of 10.  Her last episode of vomiting was 30 minutes ago.    Past Medical History:  Diagnosis Date   Anemia    Arthritis    knees, hands   Asthma    Chronic diastolic (congestive) heart failure (HCC)    COPD (chronic obstructive pulmonary disease) (HCC)    Diabetes mellitus without complication ([place])    type 2   DKA (diabetic ketoacidosis) ([place]) 10/15/2020   Dysfunctional uterine bleeding    GERD (gastroesophageal reflux disease)    Hypertension    Neuromuscular disorder (HCC)    neuropathy feet   Seizures ([place]) 09/12/2017   pt states r/t stress and blood sugar - no meds last one 4 months ago, not seen neurologist   Sickle cell trait ([place])    Smoker    Vitamin D deficiency 10/2019   Wears glasses     Past Surgical History:  Procedure Laterality Date   CESAREAN SECTION     x 1. for twins   DILATION AND CURETTAGE OF UTERUS N/A 08/20/2019   Procedure: DILATATION AND CURETTAGE;  Surgeon: Emily Filbert, MD;  Location: [place];  Service: Gynecology;  Laterality: N/A;   ENDOMETRIAL ABLATION N/A 08/20/2019   Procedure: Minerva  Ablation;  Surgeon: Emily Filbert, MD;  Location: [place];  Service: Gynecology;  Laterality: N/A;   EYE SURGERY Bilateral    laser right and cataract removed left eye   RADIOLOGY WITH ANESTHESIA N/A 09/16/2019   Procedure: MRI WITH ANESTHESIA   L SPINE WITHOUT CONTRAST, T SPINE WITHOUT CONTRAST , CERVICAL WITHOUT CONTRAST;  Surgeon: Radiologist, Medication, MD;  Location: [place];  Service: Radiology;  Laterality: N/A;   TUBAL LIGATION     interval BTL   UPPER GI ENDOSCOPY  07/2017    Family History  Problem Relation Age of Onset   Diabetes Mother    Hypertension Mother     Social History   Tobacco Use   Smoking status: Some Days    Packs/day: 0.25    Years: 26.00    Pack years: 6.50    Types: Cigarettes    Last attempt to quit: 09/13/2020    Years since quitting: 0.9   Smokeless tobacco: Never   Tobacco comments:    4-5 cigarettes/day  Vaping Use   Vaping Use: Never used  Substance Use Topics   Alcohol use: No   Drug use: No    Prior to Admission medications   Medication Sig Start Date End Date Taking? Authorizing Provider  haloperidol (HALDOL) 5 MG tablet Take 0.5-1 tablets (2.5-5 mg  total) by mouth every 8 (eight) hours as needed (For nausea/vomiting). 08/20/21  Yes Deshauna Cayson, MD  Accu-Chek Softclix Lancets lancets USE AS DIRECTED UP TO FOUR TIMES DAILY 02/17/21   Vevelyn Francois, NP  albuterol (PROVENTIL) (2.5 MG/3ML) 0.083% nebulizer solution Take 3 mLs (2.5 mg total) by nebulization every 6 (six) hours as needed for wheezing or shortness of breath. 07/18/21   Margaretha Seeds, MD  albuterol (VENTOLIN HFA) 108 (90 Base) MCG/ACT inhaler Inhale 2 puffs into the lungs every 6 (six) hours as needed for wheezing or shortness of breath. 07/18/21   Margaretha Seeds, MD  aspirin EC 81 MG tablet Take 1 tablet (81 mg total) by mouth daily. 07/07/21   Patwardhan, Reynold Bowen, MD  blood glucose meter kit and supplies KIT 1 each by Other route See admin instructions. Dispense based on  patient and insurance preference. Use up to four times daily as directed. (FOR ICD-9 250.00, 250.01). 09/02/20   Vevelyn Francois, NP  Blood Pressure Monitoring (ADULT BLOOD PRESSURE CUFF LG) KIT 1 kit by Does not apply route daily. 05/05/21   Vevelyn Francois, NP  budesonide-formoterol (SYMBICORT) 160-4.5 MCG/ACT inhaler INHALE 2 PUFFS INTO THE LUNGS 2 (TWO) TIMES DAILY. 07/18/21   Margaretha Seeds, MD  cetirizine (ZYRTEC) 10 MG tablet Take 1 tablet (10 mg total) by mouth daily. 06/07/21   Vevelyn Francois, NP  Continuous Blood Gluc Sensor (DEXCOM G6 SENSOR) MISC 1 Device by Does not apply route as directed. 06/27/21   Shamleffer, Melanie Crazier, MD  Continuous Blood Gluc Transmit (DEXCOM G6 TRANSMITTER) MISC 1 Device by Does not apply route as directed. 06/27/21   Shamleffer, Melanie Crazier, MD  Dexlansoprazole (DEXILANT) 30 MG capsule Take 1 capsule (30 mg total) by mouth daily. 04/13/21 04/13/22  Vevelyn Francois, NP  dicyclomine (BENTYL) 20 MG tablet Take 1 tablet (20 mg total) by mouth 3 (three) times daily before meals. 06/24/21 06/24/22  Vevelyn Francois, NP  empagliflozin (JARDIANCE) 25 MG TABS tablet Take 1 tablet (25 mg total) by mouth daily before breakfast. 06/27/21   Shamleffer, Melanie Crazier, MD  ferrous sulfate 325 (65 FE) MG tablet Take 1 tablet (325 mg total) by mouth 3 (three) times daily with meals. 12/02/20 12/02/21  Vevelyn Francois, NP  fluticasone (FLONASE) 50 MCG/ACT nasal spray Place 2 sprays into both nostrils daily. 12/02/20   Vevelyn Francois, NP  furosemide (LASIX) 40 MG tablet TAKE 1 TABLET(40 MG) BY MOUTH TWICE DAILY Patient taking differently: Take 40 mg by mouth 2 (two) times daily. 02/08/21   Lorretta Harp, MD  Glucosamine Sulfate 1000 MG CAPS Take 1 capsule (1,000 mg total) by mouth 2 (two) times daily. Patient taking differently: Take 1,000 mg by mouth 2 (two) times daily. 08/22/19   Hilts, Michael, MD  glucose blood (ACCU-CHEK GUIDE) test strip USE AS DIRECTED UP TO FOUR TIMES  DAILY 07/29/21   Passmore, Jake Church I, NP  Heating Pads (HEATING PAD MOIST/DRY KING SZ) PADS 1 each by Does not apply route 4 (four) times daily as needed. 02/14/21   Raulkar, Clide Deutscher, MD  hydrochlorothiazide (MICROZIDE) 12.5 MG capsule TAKE 1 CAPSULE(12.5 MG) BY MOUTH DAILY 08/03/21   Cantwell, Celeste C, PA-C  HYDROcodone-acetaminophen (NORCO) 10-325 MG tablet Take 1 tablet by mouth 3 (three) times daily as needed. Do Not Fill Before 07/28/2021: 07/25/21   Bayard Hugger, NP  hydrocortisone 2.5 % cream Apply topically 2 (two) times daily. Patient taking differently: Apply  1 application topically 2 (two) times daily. 09/22/20   Vevelyn Francois, NP  hydroquinone 4 % cream APPLY TOPICALLY TWICE DAILY Patient taking differently: Apply 1 application topically 2 (two) times daily as needed (pain). 09/13/20   Vevelyn Francois, NP  insulin glargine (LANTUS SOLOSTAR) 100 UNIT/ML Solostar Pen Inject 110 Units into the skin daily. 06/27/21   Shamleffer, Melanie Crazier, MD  insulin lispro (HUMALOG KWIKPEN) 200 UNIT/ML KwikPen Max daily 180 units 06/27/21   Shamleffer, Melanie Crazier, MD  Insulin Pen Needle 31G X 8 MM MISC 1 Device by Does not apply route in the morning, at noon, in the evening, and at bedtime. 02/16/21   Shamleffer, Melanie Crazier, MD  isosorbide mononitrate (IMDUR) 30 MG 24 hr tablet Take 1 tablet (30 mg total) by mouth daily. 07/07/21 10/05/21  Patwardhan, Reynold Bowen, MD  losartan (COZAAR) 100 MG tablet Take 1 tablet (100 mg total) by mouth daily. TAKE 1 TABLET(50MG TOTAL) BY MOUTH EVERY DAY 07/15/21 07/15/22  Patwardhan, Reynold Bowen, MD  metoprolol succinate (TOPROL-XL) 100 MG 24 hr tablet Take 100 mg by mouth daily. Take with or immediately following a meal.    [provider]  norethindrone (AYGESTIN) 5 MG tablet Take 3 tablets (15 mg total) by mouth 3 (three) times daily. With bleeding 07/25/21 08/24/21  Luvenia Redden, PA-C  ondansetron (ZOFRAN ODT) 4 MG disintegrating tablet Take 1 tablet  (4 mg total) by mouth every 8 (eight) hours as needed for nausea or vomiting. 08/17/21   [place]wood Gambler, MD  potassium chloride SA (KLOR-CON) 20 MEQ tablet TAKE 1 TABLET(20 MEQ) BY MOUTH DAILY Patient taking differently: Take 20 mEq by mouth daily. 06/06/21   Lorretta Harp, MD  promethazine (PHENERGAN) 25 MG tablet Take 1 tablet (25 mg total) by mouth every 6 (six) hours as needed for nausea or vomiting. 08/17/21   Sherwood Gambler, MD  rosuvastatin (CRESTOR) 20 MG tablet Take 1 tablet (20 mg total) by mouth daily. 07/07/21   Patwardhan, Reynold Bowen, MD  saxagliptin HCl (ONGLYZA) 5 MG TABS tablet Take 1 tablet (5 mg total) by mouth daily. 12/02/20 12/02/21  Vevelyn Francois, NP  senna-docusate (SENOKOT-S) 8.6-50 MG tablet Take 1 tablet by mouth at bedtime as needed for mild constipation. 03/24/21   Debbe Odea, MD  spironolactone (ALDACTONE) 50 MG tablet TAKE 1 TABLET(50 MG) BY MOUTH DAILY Patient taking differently: Take 50 mg by mouth daily. 12/22/20   Patwardhan, Reynold Bowen, MD  sucralfate (CARAFATE) 1 GM/10ML suspension Take 10 mLs (1 g total) by mouth 4 (four) times daily. 05/18/21 05/18/22  Vevelyn Francois, NP  Tiotropium Bromide Monohydrate (SPIRIVA RESPIMAT) 2.5 MCG/ACT AERS Inhale 2 puffs into the lungs daily. 07/18/21   Margaretha Seeds, MD  tiZANidine (ZANAFLEX) 4 MG tablet Take 1.5 tablets (6 mg total) by mouth every 6 (six) hours as needed for muscle spasms. 07/29/21   Bo Merino I, NP  TOBRADEX ophthalmic solution Place 1 drop into the right eye 4 (four) times daily. 04/30/21   [provider]  Vitamin D, Ergocalciferol, (DRISDOL) 1.25 MG (50000 UNIT) CAPS capsule Take 1 capsule (50,000 Units total) by mouth every Sunday for 8 doses. 08/21/21 10/10/21  Bo Merino I, NP  vitamin E (VITAMIN E) 180 MG (400 UNITS) capsule Take 1 capsule (400 Units total) by mouth daily. 01/07/21   Izora Ribas, MD    Allergies Amitriptyline, Ketoprofen, Trazodone, Aspirin, Gabapentin, Ibuprofen,  Liraglutide, Naproxen, Omeprazole-sodium bicarbonate, Sulfa antibiotics, and Tramadol  REVIEW OF SYSTEMS  Negative except as noted here or in the History of Present Illness.   PHYSICAL EXAMINATION  Initial Vital Signs Blood pressure (!) 151/89, pulse 95, temperature 99.6 F (37.6 C), temperature source Oral, resp. rate (!) 22, height _0  (1.575 m), weight 123.4 kg, SpO2 100 %.  Examination General: Well-developed, high BMI female in no acute distress; appearance consistent with age of record HENT: normocephalic; atraumatic Eyes: pupils equal, round and reactive to light; extraocular muscles intact; bilateral pseudophakia Neck: supple Heart: regular rate and rhythm Lungs: clear to auscultation bilaterally Abdomen: soft; nondistended; diffusely tender; bowel sounds present Extremities: No deformity; full range of motion; pulses normal Neurologic: Awake, alert and oriented; motor function intact in all extremities and symmetric; no facial droop Skin: Warm and dry Psychiatric: Grimacing   RESULTS  Summary of this visit's results, reviewed and interpreted by myself:   EKG Interpretation  Date/Time:    Ventricular Rate:    PR Interval:    QRS Duration:   QT Interval:    QTC Calculation:   R Axis:     Text Interpretation:         Laboratory Studies: Results for orders placed or performed during the hospital encounter of 08/20/21 (from the past 24 hour(s))  CBC with Differential/Platelet     Status: Abnormal   Collection Time: 08/20/21  4:38 AM  Result Value Ref Range   WBC 12.5 (H) 4.0 - 10.5 K/uL   RBC 5.05 3.87 - 5.11 MIL/uL   Hemoglobin 11.7 (L) 12.0 - 15.0 g/dL   HCT 36.3 36.0 - 46.0 %   MCV 71.9 (L) 80.0 - 100.0 fL   MCH 23.2 (L) 26.0 - 34.0 pg   MCHC 32.2 30.0 - 36.0 g/dL   RDW 22.1 (H) 11.5 - 15.5 %   Platelets 279 150 - 400 K/uL   nRBC 0.0 0.0 - 0.2 %   Neutrophils Relative % 72 %   Neutro Abs 9.0 (H) 1.7 - 7.7 K/uL   Lymphocytes Relative 21 %   Lymphs  Abs 2.6 0.7 - 4.0 K/uL   Monocytes Relative 5 %   Monocytes Absolute 0.7 0.1 - 1.0 K/uL   Eosinophils Relative 1 %   Eosinophils Absolute 0.1 0.0 - 0.5 K/uL   Basophils Relative 1 %   Basophils Absolute 0.1 0.0 - 0.1 K/uL   Immature Granulocytes 0 %   Abs Immature Granulocytes 0.05 0.00 - 0.07 K/uL  Basic metabolic panel     Status: Abnormal   Collection Time: 08/20/21  4:38 AM  Result Value Ref Range   Sodium 137 135 - 145 mmol/L   Potassium 3.7 3.5 - 5.1 mmol/L   Chloride 102 98 - 111 mmol/L   CO2 23 22 - 32 mmol/L   Glucose, Bld 68 (L) 70 - 99 mg/dL   BUN 13 6 - 20 mg/dL   Creatinine, Ser 1.11 (H) 0.44 - 1.00 mg/dL   Calcium 9.8 8.9 - 10.3 mg/dL   GFR, Estimated >60 >60 mL/min   Anion gap 12 5 - 15   *Note: Due to a large number of results and/or encounters for the requested time period, some results have not been displayed. A complete set of results can be found in Results Review.   Imaging Studies: CT ABDOMEN PELVIS W CONTRAST  Result Date: 08/19/2021 CLINICAL DATA:  Abdominal pain EXAM: CT ABDOMEN AND PELVIS WITH CONTRAST TECHNIQUE: Multidetector CT imaging of the abdomen and pelvis was performed using the standard protocol  following bolus administration of intravenous contrast. CONTRAST:  172m OMNIPAQUE IOHEXOL 300 MG/ML  SOLN COMPARISON:  Multiple priors, most recently 06/22/2021 FINDINGS: Lower chest: Lung bases are clear. Hepatobiliary: Stable heterogeneous appearance with multifocal geographic low density, including in the lateral segment left hepatic lobe and inferior right hepatic lobe, presumably related to heterogeneous hepatic steatosis. Gallbladder is notable for layering tiny gallstones (series 3/image 32). No stone intrahepatic or extrahepatic ductal dilatation. Pancreas: Within normal limits. Spleen: Within normal limits. Adrenals/Urinary Tract: Adrenal glands are within normal limits. Kidneys are within normal limits.  No hydronephrosis. Bladder is mildly  thick-walled although underdistended. Stomach/Bowel: Stomach is within normal limits. No evidence of bowel obstruction. Normal appendix (series 3/image 53). No colonic wall thickening or inflammatory changes. Vascular/Lymphatic: No evidence of abdominal aortic aneurysm. No suspicious abdominopelvic lymphadenopathy. Reproductive: Uterus is within normal limits. Bilateral ovaries are within normal limits. Other: No abdominopelvic ascites. Musculoskeletal: Visualized osseous structures are within normal limits. IMPRESSION: No CT findings to account for the patient's abdominal pain. Geographic hepatic steatosis, chronic. Electronically Signed   By: SJulian HyM.D.   On: 08/19/2021 03:10    ED COURSE and MDM  Nursing notes, initial and subsequent vitals signs, including pulse oximetry, reviewed and interpreted by myself.  Vitals:   08/20/21 0234 08/20/21 0445  BP: (!) 151/89 (!) 157/60  Pulse: 95 91  Resp: (!) 22 18  Temp: 99.6 F (37.6 C)   TempSrc: Oral   SpO2: 100% 100%  Weight: 123.4 kg   Height: _0  (1.575 m)    Medications  morphine 2 MG/ML injection 2 mg (has no administration in time range)  lactated ringers bolus 1,000 mL (1,000 mLs Intravenous New Bag/Given 08/20/21 0434)  HYDROmorphone (DILAUDID) injection 1 mg (1 mg Intravenous Given 08/20/21 0435)  haloperidol lactate (HALDOL) injection 2 mg (2 mg Intravenous Given 08/20/21 0435)   5:36 AM Patient much more comfortable after IV Dilaudid and haloperidol.  The haloperidol, which she was also given on her previous visit, appear to be more effective than other antiemetics.  We will give her a prescription for home use as this may help prevent visits to the ED.  She may have an acute gastrointestinal virus that is exacerbating her chronic symptoms.   PROCEDURES  Procedures   ED DIAGNOSES     ICD-10-CM   1. Chronic abdominal pain  R10.9    G89.29     2. Nausea, vomiting and diarrhea  R11.2    R19.7          Karson Chicas,  JJenny Reichmann MD 08/20/21 [id number]

## 2021-08-20 NOTE — ED Triage Notes (Signed)
  Patient BIB EMS for abdominal pain and nausea that has been going on for 3-4 days.  Patient has been seen twice for same in last 72 hours but unable to control pain.  Last emesis episode about 30 minutes ago.  Pain 10/10, sharp, generalized abdominal pain.

## 2021-08-21 ENCOUNTER — Encounter (HOSPITAL_BASED_OUTPATIENT_CLINIC_OR_DEPARTMENT_OTHER): Payer: Self-pay | Admitting: Obstetrics and Gynecology

## 2021-08-21 ENCOUNTER — Emergency Department (HOSPITAL_BASED_OUTPATIENT_CLINIC_OR_DEPARTMENT_OTHER)
Admission: EM | Admit: 2021-08-21 | Discharge: 2021-08-22 | Disposition: A | Discharge status: Home / Self Care | Payer: Medicaid Other | Attending: Emergency Medicine | Admitting: Emergency Medicine

## 2021-08-21 DIAGNOSIS — R1084 Generalized abdominal pain: Secondary | ICD-10-CM | POA: Insufficient documentation

## 2021-08-21 DIAGNOSIS — E876 Hypokalemia: Secondary | ICD-10-CM | POA: Insufficient documentation

## 2021-08-21 DIAGNOSIS — Z7982 Long term (current) use of aspirin: Secondary | ICD-10-CM | POA: Insufficient documentation

## 2021-08-21 DIAGNOSIS — I110 Hypertensive heart disease with heart failure: Secondary | ICD-10-CM | POA: Insufficient documentation

## 2021-08-21 DIAGNOSIS — E119 Type 2 diabetes mellitus without complications: Secondary | ICD-10-CM | POA: Insufficient documentation

## 2021-08-21 DIAGNOSIS — J449 Chronic obstructive pulmonary disease, unspecified: Secondary | ICD-10-CM | POA: Diagnosis not present

## 2021-08-21 DIAGNOSIS — E1143 Type 2 diabetes mellitus with diabetic autonomic (poly)neuropathy: Secondary | ICD-10-CM | POA: Diagnosis not present

## 2021-08-21 DIAGNOSIS — Z7951 Long term (current) use of inhaled steroids: Secondary | ICD-10-CM | POA: Diagnosis not present

## 2021-08-21 DIAGNOSIS — R410 Disorientation, unspecified: Secondary | ICD-10-CM | POA: Diagnosis not present

## 2021-08-21 DIAGNOSIS — Z7984 Long term (current) use of oral hypoglycemic drugs: Secondary | ICD-10-CM | POA: Insufficient documentation

## 2021-08-21 DIAGNOSIS — E162 Hypoglycemia, unspecified: Secondary | ICD-10-CM | POA: Diagnosis not present

## 2021-08-21 DIAGNOSIS — F17210 Nicotine dependence, cigarettes, uncomplicated: Secondary | ICD-10-CM | POA: Diagnosis not present

## 2021-08-21 DIAGNOSIS — R000 Tachycardia, unspecified: Secondary | ICD-10-CM | POA: Diagnosis not present

## 2021-08-21 DIAGNOSIS — R1013 Epigastric pain: Secondary | ICD-10-CM | POA: Diagnosis not present

## 2021-08-21 DIAGNOSIS — J45909 Unspecified asthma, uncomplicated: Secondary | ICD-10-CM | POA: Diagnosis not present

## 2021-08-21 DIAGNOSIS — E1110 Type 2 diabetes mellitus with ketoacidosis without coma: Secondary | ICD-10-CM | POA: Insufficient documentation

## 2021-08-21 DIAGNOSIS — F1193 Opioid use, unspecified with withdrawal: Secondary | ICD-10-CM | POA: Diagnosis not present

## 2021-08-21 DIAGNOSIS — I5032 Chronic diastolic (congestive) heart failure: Secondary | ICD-10-CM | POA: Diagnosis not present

## 2021-08-21 DIAGNOSIS — E11649 Type 2 diabetes mellitus with hypoglycemia without coma: Secondary | ICD-10-CM | POA: Insufficient documentation

## 2021-08-21 DIAGNOSIS — Z79899 Other long term (current) drug therapy: Secondary | ICD-10-CM | POA: Diagnosis not present

## 2021-08-21 DIAGNOSIS — R112 Nausea with vomiting, unspecified: Secondary | ICD-10-CM | POA: Diagnosis not present

## 2021-08-21 LAB — CBC WITH DIFFERENTIAL/PLATELET
Abs Immature Granulocytes: 0.03 10*3/uL (ref 0.00–0.07)
Basophils Absolute: 0.1 10*3/uL (ref 0.0–0.1)
Basophils Relative: 1 %
Eosinophils Absolute: 0.2 10*3/uL (ref 0.0–0.5)
Eosinophils Relative: 2 %
HCT: 36.2 % (ref 36.0–46.0)
Hemoglobin: 11.8 g/dL — ABNORMAL LOW (ref 12.0–15.0)
Immature Granulocytes: 0 %
Lymphocytes Relative: 22 %
Lymphs Abs: 2.9 10*3/uL (ref 0.7–4.0)
MCH: 23.2 pg — ABNORMAL LOW (ref 26.0–34.0)
MCHC: 32.6 g/dL (ref 30.0–36.0)
MCV: 71.3 fL — ABNORMAL LOW (ref 80.0–100.0)
Monocytes Absolute: 0.9 10*3/uL (ref 0.1–1.0)
Monocytes Relative: 7 %
Neutro Abs: 9.1 10*3/uL — ABNORMAL HIGH (ref 1.7–7.7)
Neutrophils Relative %: 68 %
Platelets: 320 10*3/uL (ref 150–400)
RBC: 5.08 MIL/uL (ref 3.87–5.11)
RDW: 22.4 % — ABNORMAL HIGH (ref 11.5–15.5)
WBC: 13.1 10*3/uL — ABNORMAL HIGH (ref 4.0–10.5)
nRBC: 0 % (ref 0.0–0.2)

## 2021-08-21 LAB — URINALYSIS, ROUTINE W REFLEX MICROSCOPIC
Bilirubin Urine: NEGATIVE
Glucose, UA: NEGATIVE mg/dL
Hgb urine dipstick: NEGATIVE
Ketones, ur: NEGATIVE mg/dL
Leukocytes,Ua: NEGATIVE
Nitrite: NEGATIVE
Protein, ur: NEGATIVE mg/dL
Specific Gravity, Urine: 1.015 (ref 1.005–1.030)
pH: 5.5 (ref 5.0–8.0)

## 2021-08-21 LAB — COMPREHENSIVE METABOLIC PANEL
ALT: 8 U/L (ref 0–44)
AST: 17 U/L (ref 15–41)
Albumin: 4.6 g/dL (ref 3.5–5.0)
Alkaline Phosphatase: 69 U/L (ref 38–126)
Anion gap: 14 (ref 5–15)
BUN: 11 mg/dL (ref 6–20)
CO2: 21 mmol/L — ABNORMAL LOW (ref 22–32)
Calcium: 9.3 mg/dL (ref 8.9–10.3)
Chloride: 103 mmol/L (ref 98–111)
Creatinine, Ser: 1.16 mg/dL — ABNORMAL HIGH (ref 0.44–1.00)
GFR, Estimated: 60 mL/min — ABNORMAL LOW (ref >60)
Glucose, Bld: 49 mg/dL — ABNORMAL LOW (ref 70–99)
Potassium: 2.7 mmol/L — CL (ref 3.5–5.1)
Sodium: 138 mmol/L (ref 135–145)
Total Bilirubin: 0.6 mg/dL (ref 0.3–1.2)
Total Protein: 7.9 g/dL (ref 6.5–8.1)

## 2021-08-21 LAB — LIPASE, BLOOD: Lipase: <10 U/L — ABNORMAL LOW (ref 11–51)

## 2021-08-21 LAB — MAGNESIUM: Magnesium: 1.8 mg/dL (ref 1.7–2.4)

## 2021-08-21 MED ORDER — HALOPERIDOL LACTATE 5 MG/ML IJ SOLN
2.0000 mg | Freq: Once | INTRAMUSCULAR | Status: AC
  Administered 2021-08-21: 2 mg via INTRAVENOUS
  Filled 2021-08-21: qty 1

## 2021-08-21 MED ORDER — SUCRALFATE 1 G PO TABS: 1.0000 g | ORAL_TABLET | Freq: Three times a day (TID) | ORAL | 0 refills | Status: DC

## 2021-08-21 MED ORDER — FAMOTIDINE IN NACL 20-0.9 MG/50ML-% IV SOLN
20.0000 mg | Freq: Once | INTRAVENOUS | Status: AC
  Administered 2021-08-21: 20 mg via INTRAVENOUS
  Filled 2021-08-21: qty 50

## 2021-08-21 MED ORDER — MORPHINE SULFATE (PF) 4 MG/ML IV SOLN
4.0000 mg | Freq: Once | INTRAVENOUS | Status: AC
  Administered 2021-08-21: 4 mg via INTRAVENOUS
  Filled 2021-08-21: qty 1

## 2021-08-21 MED ORDER — SODIUM CHLORIDE 0.9 % IV BOLUS
1000.0000 mL | Freq: Once | INTRAVENOUS | Status: AC
  Administered 2021-08-21: 1000 mL via INTRAVENOUS

## 2021-08-21 MED ORDER — DICYCLOMINE HCL 20 MG PO TABS: 20.0000 mg | ORAL_TABLET | Freq: Three times a day (TID) | ORAL | 0 refills | Status: DC | PRN

## 2021-08-21 MED ORDER — FAMOTIDINE 20 MG PO TABS: 20.0000 mg | ORAL_TABLET | Freq: Every day | ORAL | 0 refills | Status: DC

## 2021-08-21 MED ORDER — POTASSIUM CHLORIDE 10 MEQ/100ML IV SOLN
10.0000 meq | Freq: Once | INTRAVENOUS | Status: AC
  Administered 2021-08-21: 10 meq via INTRAVENOUS
  Filled 2021-08-21: qty 100

## 2021-08-21 NOTE — ED Provider Notes (Signed)
[facility] EMERGENCY DEPT Provider Note   CSN: [id number] Arrival date & time: 08/21/21  1626     History Chief Complaint  Patient presents with   Abdominal Pain    Jill Shaw is a 45 y.o. female.  Pt presents to the ED today with abdominal pain, n/v/d.  Pt has been here several times for this pain.  She had a CT of her abd/pelvis on 11/4 which showed nothing acute.  Pt said that she can't eat anything solid or she gets the pain again and starts to have n/v/d.        Past Medical History:  Diagnosis Date   Anemia    Arthritis    knees, hands   Asthma    Chronic diastolic (congestive) heart failure (HCC)    COPD (chronic obstructive pulmonary disease) (HCC)    Diabetes mellitus without complication ([place])    type 2   DKA (diabetic ketoacidosis) ([place]) 10/15/2020   Dysfunctional uterine bleeding    GERD (gastroesophageal reflux disease)    Hypertension    Neuromuscular disorder (HCC)    neuropathy feet   Seizures ([place]) 09/12/2017   pt states r/t stress and blood sugar - no meds last one 4 months ago, not seen neurologist   Sickle cell trait ([place])    Smoker    Vitamin D deficiency 10/2019   Wears glasses     Patient Active Problem List   Diagnosis Date Noted   Mixed hyperlipidemia 08/15/2021   Abnormal stress test 08/15/2021   DM (diabetes mellitus), type 2, uncontrolled, with renal complications 65/46/5035   Intractable nausea and vomiting 03/23/2021   Serotonin syndrome 03/14/2021   Overdose, undetermined intent, initial encounter 03/14/2021   Dyslipidemia 02/16/2021   Insulin resistance 02/16/2021   Type 2 diabetes mellitus without complication, with long-term current use of insulin ([place]) 02/16/2021   Amitriptyline overdose, accidental or unintentional, initial encounter 01/09/2021   Ingestion of substance, undetermined intent, initial encounter 01/08/2021   Hyperglycemia due to type 2 diabetes mellitus ([place]) 01/08/2021   SIRS (systemic  inflammatory response syndrome) ([place]) 01/08/2021   Exertional chest pain 12/31/2020   SOB (shortness of breath) 12/24/2020   Hyperkalemia 10/15/2020   Sinus tachycardia 10/14/2020   Precordial pain 10/14/2020   Abnormal findings on diagnostic imaging of lung 08/19/2020   Physical deconditioning 08/19/2020   History of endometrial ablation 07/27/2020   History of alcohol use 07/20/2020   Current moderate episode of major depressive disorder without prior episode ([place]) 07/20/2020   Sickle cell trait ([place]) 07/19/2020   Elevated d-dimer 07/13/2020   Healthcare maintenance 07/12/2020   Exertional dyspnea 07/12/2020   Atypical chest pain 07/12/2020   At risk for obstructive sleep apnea 07/12/2020   Acute on chronic diastolic (congestive) heart failure ([place]) 06/22/2020   Generalized abdominal pain 03/08/2020   Chronic diastolic CHF (congestive heart failure) ([place]) 03/08/2020   Chronic anemia 12/19/2019   Elevated sed rate 11/26/2019   Elevated C-reactive protein (CRP) 11/26/2019   Hypoglycemia 02/07/2019   Lactic acidosis 02/07/2019   Right ankle pain 02/07/2019   Chronic obstructive pulmonary disease ([place]) 02/05/2019   Vitamin D deficiency 05/31/2017   Gastroesophageal reflux disease 05/24/2017   Abnormal uterine bleeding (AUB) 05/17/2017   Anemia of chronic disease 04/06/2017   Knee pain, chronic 03/27/2016   Controlled type 2 diabetes mellitus without complication, with long-term current use of insulin ([place]) 03/27/2016   Essential hypertension 03/27/2016   Obesity, Class III, BMI 40-49.9 (morbid obesity) ([place]) 03/27/2016  Irritable bowel syndrome with constipation 03/27/2016   Tobacco dependence 03/27/2016   Arthropathy, lower leg 05/04/2013   CTS (carpal tunnel syndrome) 05/04/2013   Chronic pain 05/13/2012   Diabetic gastroparesis ([place]) 11/06/2006   Hot flashes 11/06/2006    Past Surgical History:  Procedure Laterality Date   CESAREAN SECTION     x 1. for twins    DILATION AND CURETTAGE OF UTERUS N/A 08/20/2019   Procedure: DILATATION AND CURETTAGE;  Surgeon: Emily Filbert, MD;  Location: [facility];  Service: Gynecology;  Laterality: N/A;   ENDOMETRIAL ABLATION N/A 08/20/2019   Procedure: [place] Ablation;  Surgeon: Emily Filbert, MD;  Location: [facility];  Service: Gynecology;  Laterality: N/A;   EYE SURGERY Bilateral    laser right and cataract removed left eye   RADIOLOGY WITH ANESTHESIA N/A 09/16/2019   Procedure: MRI WITH ANESTHESIA   L SPINE WITHOUT CONTRAST, T SPINE WITHOUT CONTRAST , CERVICAL WITHOUT CONTRAST;  Surgeon: Radiologist, Medication, MD;  Location: [facility];  Service: Radiology;  Laterality: N/A;   TUBAL LIGATION     interval BTL   UPPER GI ENDOSCOPY  07/2017     OB History     Gravida  3   Para      Term      Preterm      AB      Living  3      SAB      IAB      Ectopic      Multiple      Live Births  3        Obstetric Comments  Vaginal x 1 and then c-section for twins         Family History  Problem Relation Age of Onset   Diabetes Mother    Hypertension Mother     Social History   Tobacco Use   Smoking status: Some Days    Packs/day: 0.25    Years: 26.00    Pack years: 6.50    Types: Cigarettes    Last attempt to quit: 09/13/2020    Years since quitting: 0.9   Smokeless tobacco: Never   Tobacco comments:    4-5 cigarettes/day  Vaping Use   Vaping Use: Never used  Substance Use Topics   Alcohol use: No   Drug use: No    Home Medications Prior to Admission medications   Medication Sig Start Date End Date Taking? Authorizing Provider  dicyclomine (BENTYL) 20 MG tablet Take 1 tablet (20 mg total) by mouth 3 (three) times daily as needed for spasms. 08/21/21  Yes Isla Pence, MD  famotidine (PEPCID) 20 MG tablet Take 1 tablet (20 mg total) by mouth daily. 08/21/21  Yes Isla Pence, MD  sucralfate (CARAFATE) 1 g tablet Take 1 tablet (1 g total) by mouth 4 (four) times daily -  with meals and  at bedtime. 08/21/21  Yes Isla Pence, MD  Accu-Chek Softclix Lancets lancets USE AS DIRECTED UP TO FOUR TIMES DAILY 02/17/21   Vevelyn Francois, NP  albuterol (PROVENTIL) (2.5 MG/3ML) 0.083% nebulizer solution Take 3 mLs (2.5 mg total) by nebulization every 6 (six) hours as needed for wheezing or shortness of breath. 07/18/21   Margaretha Seeds, MD  albuterol (VENTOLIN HFA) 108 (90 Base) MCG/ACT inhaler Inhale 2 puffs into the lungs every 6 (six) hours as needed for wheezing or shortness of breath. 07/18/21   Margaretha Seeds, MD  aspirin EC 81 MG tablet Take 1  tablet (81 mg total) by mouth daily. 07/07/21   Patwardhan, Reynold Bowen, MD  blood glucose meter kit and supplies KIT 1 each by Other route See admin instructions. Dispense based on patient and insurance preference. Use up to four times daily as directed. (FOR ICD-9 250.00, 250.01). 09/02/20   Vevelyn Francois, NP  Blood Pressure Monitoring (ADULT BLOOD PRESSURE CUFF LG) KIT 1 kit by Does not apply route daily. 05/05/21   Vevelyn Francois, NP  budesonide-formoterol (SYMBICORT) 160-4.5 MCG/ACT inhaler INHALE 2 PUFFS INTO THE LUNGS 2 (TWO) TIMES DAILY. 07/18/21   Margaretha Seeds, MD  cetirizine (ZYRTEC) 10 MG tablet Take 1 tablet (10 mg total) by mouth daily. 06/07/21   Vevelyn Francois, NP  Continuous Blood Gluc Sensor (DEXCOM G6 SENSOR) MISC 1 Device by Does not apply route as directed. 06/27/21   Shamleffer, Melanie Crazier, MD  Continuous Blood Gluc Transmit (DEXCOM G6 TRANSMITTER) MISC 1 Device by Does not apply route as directed. 06/27/21   Shamleffer, Melanie Crazier, MD  Dexlansoprazole (DEXILANT) 30 MG capsule Take 1 capsule (30 mg total) by mouth daily. 04/13/21 04/13/22  Vevelyn Francois, NP  empagliflozin (JARDIANCE) 25 MG TABS tablet Take 1 tablet (25 mg total) by mouth daily before breakfast. 06/27/21   Shamleffer, Melanie Crazier, MD  ferrous sulfate 325 (65 FE) MG tablet Take 1 tablet (325 mg total) by mouth 3 (three) times daily with meals.  12/02/20 12/02/21  Vevelyn Francois, NP  fluticasone (FLONASE) 50 MCG/ACT nasal spray Place 2 sprays into both nostrils daily. 12/02/20   Vevelyn Francois, NP  furosemide (LASIX) 40 MG tablet TAKE 1 TABLET(40 MG) BY MOUTH TWICE DAILY Patient taking differently: Take 40 mg by mouth 2 (two) times daily. 02/08/21   Lorretta Harp, MD  Glucosamine Sulfate 1000 MG CAPS Take 1 capsule (1,000 mg total) by mouth 2 (two) times daily. Patient taking differently: Take 1,000 mg by mouth 2 (two) times daily. 08/22/19   Hilts, Legrand Como, MD  glucose blood (ACCU-CHEK GUIDE) test strip USE AS DIRECTED UP TO FOUR TIMES DAILY 07/29/21   Passmore, Jake Church I, NP  haloperidol (HALDOL) 5 MG tablet Take 0.5-1 tablets (2.5-5 mg total) by mouth every 8 (eight) hours as needed (For nausea/vomiting). 08/20/21   Molpus, John, MD  Heating Pads (HEATING PAD MOIST/DRY KING SZ) PADS 1 each by Does not apply route 4 (four) times daily as needed. 02/14/21   Raulkar, Clide Deutscher, MD  hydrochlorothiazide (MICROZIDE) 12.5 MG capsule TAKE 1 CAPSULE(12.5 MG) BY MOUTH DAILY 08/03/21   Cantwell, Celeste C, PA-C  HYDROcodone-acetaminophen (NORCO) 10-325 MG tablet Take 1 tablet by mouth 3 (three) times daily as needed. Do Not Fill Before 07/28/2021: 07/25/21   Bayard Hugger, NP  hydrocortisone 2.5 % cream Apply topically 2 (two) times daily. Patient taking differently: Apply 1 application topically 2 (two) times daily. 09/22/20   Vevelyn Francois, NP  hydroquinone 4 % cream APPLY TOPICALLY TWICE DAILY Patient taking differently: Apply 1 application topically 2 (two) times daily as needed (pain). 09/13/20   Vevelyn Francois, NP  insulin glargine (LANTUS SOLOSTAR) 100 UNIT/ML Solostar Pen Inject 110 Units into the skin daily. 06/27/21   Shamleffer, Melanie Crazier, MD  insulin lispro (HUMALOG KWIKPEN) 200 UNIT/ML KwikPen Max daily 180 units 06/27/21   Shamleffer, Melanie Crazier, MD  Insulin Pen Needle 31G X 8 MM MISC 1 Device by Does not apply route in the  morning, at noon, in the evening, and at bedtime.  02/16/21   Shamleffer, Melanie Crazier, MD  isosorbide mononitrate (IMDUR) 30 MG 24 hr tablet Take 1 tablet (30 mg total) by mouth daily. 07/07/21 10/05/21  Patwardhan, Reynold Bowen, MD  losartan (COZAAR) 100 MG tablet Take 1 tablet (100 mg total) by mouth daily. TAKE 1 TABLET($RemoveBefor'50MG'AknXibetBgip$  TOTAL) BY MOUTH EVERY DAY 07/15/21 07/15/22  Patwardhan, Reynold Bowen, MD  metoprolol succinate (TOPROL-XL) 100 MG 24 hr tablet Take 100 mg by mouth daily. Take with or immediately following a meal.    [provider]  norethindrone (AYGESTIN) 5 MG tablet Take 3 tablets (15 mg total) by mouth 3 (three) times daily. With bleeding 07/25/21 08/24/21  Luvenia Redden, PA-C  ondansetron (ZOFRAN ODT) 4 MG disintegrating tablet Take 1 tablet (4 mg total) by mouth every 8 (eight) hours as needed for nausea or vomiting. 08/17/21   Sherwood Gambler, MD  potassium chloride SA (KLOR-CON) 20 MEQ tablet TAKE 1 TABLET(20 MEQ) BY MOUTH DAILY Patient taking differently: Take 20 mEq by mouth daily. 06/06/21   Lorretta Harp, MD  promethazine (PHENERGAN) 25 MG tablet Take 1 tablet (25 mg total) by mouth every 6 (six) hours as needed for nausea or vomiting. 08/17/21   Sherwood Gambler, MD  rosuvastatin (CRESTOR) 20 MG tablet Take 1 tablet (20 mg total) by mouth daily. 07/07/21   Patwardhan, Reynold Bowen, MD  saxagliptin HCl (ONGLYZA) 5 MG TABS tablet Take 1 tablet (5 mg total) by mouth daily. 12/02/20 12/02/21  Vevelyn Francois, NP  senna-docusate (SENOKOT-S) 8.6-50 MG tablet Take 1 tablet by mouth at bedtime as needed for mild constipation. 03/24/21   Debbe Odea, MD  spironolactone (ALDACTONE) 50 MG tablet TAKE 1 TABLET(50 MG) BY MOUTH DAILY Patient taking differently: Take 50 mg by mouth daily. 12/22/20   Patwardhan, Reynold Bowen, MD  Tiotropium Bromide Monohydrate (SPIRIVA RESPIMAT) 2.5 MCG/ACT AERS Inhale 2 puffs into the lungs daily. 07/18/21   Margaretha Seeds, MD  tiZANidine (ZANAFLEX) 4 MG tablet Take 1.5  tablets (6 mg total) by mouth every 6 (six) hours as needed for muscle spasms. 07/29/21   Bo Merino I, NP  TOBRADEX ophthalmic solution Place 1 drop into the right eye 4 (four) times daily. 04/30/21   [provider]  Vitamin D, Ergocalciferol, (DRISDOL) 1.25 MG (50000 UNIT) CAPS capsule Take 1 capsule (50,000 Units total) by mouth every Sunday for 8 doses. 08/21/21 10/10/21  Bo Merino I, NP  vitamin E (VITAMIN E) 180 MG (400 UNITS) capsule Take 1 capsule (400 Units total) by mouth daily. 01/07/21   Izora Ribas, MD    Allergies    Amitriptyline, Ketoprofen, Trazodone, Aspirin, Gabapentin, Ibuprofen, Liraglutide, Naproxen, Omeprazole-sodium bicarbonate, Sulfa antibiotics, and Tramadol  Review of Systems   Review of Systems  Gastrointestinal:  Positive for abdominal pain, diarrhea, nausea and vomiting.  All other systems reviewed and are negative.  Physical Exam Updated Vital Signs BP 135/78   Pulse 89   Temp 99.2 F (37.3 C)   Resp (!) 24   SpO2 99%   Physical Exam Vitals and nursing note reviewed.  Constitutional:      Appearance: She is well-developed. She is obese.  HENT:     Head: Normocephalic and atraumatic.     Mouth/Throat:     Mouth: Mucous membranes are moist.     Pharynx: Oropharynx is clear.  Eyes:     Extraocular Movements: Extraocular movements intact.     Pupils: Pupils are equal, round, and reactive to light.  Cardiovascular:  Rate and Rhythm: Normal rate and regular rhythm.  Pulmonary:     Effort: Pulmonary effort is normal.     Breath sounds: Normal breath sounds.  Abdominal:     General: Abdomen is flat. Bowel sounds are normal.     Palpations: Abdomen is soft.     Tenderness: There is generalized abdominal tenderness.  Skin:    General: Skin is warm.     Capillary Refill: Capillary refill takes less than 2 seconds.  Neurological:     General: No focal deficit present.     Mental Status: She is alert and oriented to  person, place, and time.  Psychiatric:        Mood and Affect: Mood normal.        Behavior: Behavior normal.    ED Results / Procedures / Treatments   Labs (all labs ordered are listed, but only abnormal results are displayed) Labs Reviewed  CBC WITH DIFFERENTIAL/PLATELET - Abnormal; Notable for the following components:      Result Value   WBC 13.1 (*)    Hemoglobin 11.8 (*)    MCV 71.3 (*)    MCH 23.2 (*)    RDW 22.4 (*)    Neutro Abs 9.1 (*)    All other components within normal limits  COMPREHENSIVE METABOLIC PANEL - Abnormal; Notable for the following components:   Potassium 2.7 (*)    CO2 21 (*)    Glucose, Bld 49 (*)    Creatinine, Ser 1.16 (*)    GFR, Estimated 60 (*)    All other components within normal limits  LIPASE, BLOOD - Abnormal; Notable for the following components:   Lipase <10 (*)    All other components within normal limits  GASTROINTESTINAL PANEL BY PCR, STOOL (REPLACES STOOL CULTURE)  C DIFFICILE QUICK SCREEN W PCR REFLEX    URINALYSIS, ROUTINE W REFLEX MICROSCOPIC  MAGNESIUM    EKG None  Radiology No results found.  Procedures Procedures   Medications Ordered in ED Medications  potassium chloride 10 mEq in 100 mL IVPB (10 mEq Intravenous New Bag/Given 08/21/21 2300)  morphine 4 MG/ML injection 4 mg (has no administration in time range)  sodium chloride 0.9 % bolus 1,000 mL (1,000 mLs Intravenous New Bag/Given 08/21/21 2238)  morphine 4 MG/ML injection 4 mg (4 mg Intravenous Given 08/21/21 2232)  haloperidol lactate (HALDOL) injection 2 mg (2 mg Intravenous Given 08/21/21 2233)  famotidine (PEPCID) IVPB 20 mg premix (0 mg Intravenous Stopped 08/21/21 2320)    ED Course  I have reviewed the triage vital signs and the nursing notes.  Pertinent labs & imaging results that were available during my care of the patient were reviewed by me and considered in my medical decision making (see chart for details).    MDM Rules/Calculators/A&P                            Pt is feeling much better after pain meds.  She tells me she is on a pain management contract and has run out of her pain meds.  She is not due to go to her pain clinic until the 10th.  Worsening n/v/d and pain likely due to narcotic withdrawal.  Pt is aware that she can't get a narcotic rx from the ED if she is on a pain contract.  Pt requests a referral to GI in [place], so that is done.  Pt is stable for d/c.  She  knows to return if worse. Final Clinical Impression(s) / ED Diagnoses Final diagnoses:  Generalized abdominal pain  Narcotic withdrawal ([place])  Hypokalemia    Rx / DC Orders ED Discharge Orders          Ordered    famotidine (PEPCID) 20 MG tablet  Daily        08/21/21 2344    dicyclomine (BENTYL) 20 MG tablet  3 times daily PRN        08/21/21 2344    sucralfate (CARAFATE) 1 g tablet  3 times daily with meals & bedtime        08/21/21 2344    Ambulatory referral to Gastroenterology        08/21/21 Meadow, Taylynn, MD 08/21/21 2348

## 2021-08-21 NOTE — ED Notes (Addendum)
[facility] 2.7 read back Jill Shaw. Samuel Germany, RN and Dr Wilkie Aye notified.

## 2021-08-21 NOTE — ED Triage Notes (Signed)
Patient reports to the ER for chronic abdominal pain. States she has nausea, abdominal pain, diarrhea.

## 2021-08-21 NOTE — ED Notes (Signed)
Patient asked RN "Can't you just run an IV with some of that medication that starts with a D and make me feel better?"  RN explained that would not be an acceptable option and that it was inappropriate for her care without the doctor seeing her first.

## 2021-08-22 ENCOUNTER — Emergency Department (HOSPITAL_COMMUNITY)
Admission: EM | Admit: 2021-08-22 | Discharge: 2021-08-22 | Disposition: A | Discharge status: Home / Self Care | Payer: Medicaid Other | Source: Home / Self Care | Attending: Student | Admitting: Student

## 2021-08-22 ENCOUNTER — Other Ambulatory Visit: Payer: Self-pay

## 2021-08-22 ENCOUNTER — Encounter (HOSPITAL_COMMUNITY): Payer: Self-pay | Admitting: Emergency Medicine

## 2021-08-22 ENCOUNTER — Emergency Department (HOSPITAL_COMMUNITY): Payer: Medicaid Other

## 2021-08-22 DIAGNOSIS — J449 Chronic obstructive pulmonary disease, unspecified: Secondary | ICD-10-CM | POA: Insufficient documentation

## 2021-08-22 DIAGNOSIS — Z7984 Long term (current) use of oral hypoglycemic drugs: Secondary | ICD-10-CM | POA: Insufficient documentation

## 2021-08-22 DIAGNOSIS — E161 Other hypoglycemia: Secondary | ICD-10-CM | POA: Diagnosis not present

## 2021-08-22 DIAGNOSIS — E1143 Type 2 diabetes mellitus with diabetic autonomic (poly)neuropathy: Secondary | ICD-10-CM | POA: Insufficient documentation

## 2021-08-22 DIAGNOSIS — F17210 Nicotine dependence, cigarettes, uncomplicated: Secondary | ICD-10-CM | POA: Insufficient documentation

## 2021-08-22 DIAGNOSIS — Z7951 Long term (current) use of inhaled steroids: Secondary | ICD-10-CM | POA: Insufficient documentation

## 2021-08-22 DIAGNOSIS — R000 Tachycardia, unspecified: Secondary | ICD-10-CM | POA: Diagnosis not present

## 2021-08-22 DIAGNOSIS — R404 Transient alteration of awareness: Secondary | ICD-10-CM | POA: Diagnosis not present

## 2021-08-22 DIAGNOSIS — G5603 Carpal tunnel syndrome, bilateral upper limbs: Secondary | ICD-10-CM | POA: Diagnosis not present

## 2021-08-22 DIAGNOSIS — Z7982 Long term (current) use of aspirin: Secondary | ICD-10-CM | POA: Insufficient documentation

## 2021-08-22 DIAGNOSIS — Z79899 Other long term (current) drug therapy: Secondary | ICD-10-CM | POA: Insufficient documentation

## 2021-08-22 DIAGNOSIS — I110 Hypertensive heart disease with heart failure: Secondary | ICD-10-CM | POA: Insufficient documentation

## 2021-08-22 DIAGNOSIS — E11649 Type 2 diabetes mellitus with hypoglycemia without coma: Secondary | ICD-10-CM | POA: Insufficient documentation

## 2021-08-22 DIAGNOSIS — Z7409 Other reduced mobility: Secondary | ICD-10-CM | POA: Diagnosis not present

## 2021-08-22 DIAGNOSIS — I5033 Acute on chronic diastolic (congestive) heart failure: Secondary | ICD-10-CM | POA: Insufficient documentation

## 2021-08-22 DIAGNOSIS — J45909 Unspecified asthma, uncomplicated: Secondary | ICD-10-CM | POA: Insufficient documentation

## 2021-08-22 DIAGNOSIS — R231 Pallor: Secondary | ICD-10-CM | POA: Diagnosis not present

## 2021-08-22 DIAGNOSIS — E162 Hypoglycemia, unspecified: Secondary | ICD-10-CM

## 2021-08-22 DIAGNOSIS — R1013 Epigastric pain: Secondary | ICD-10-CM

## 2021-08-22 DIAGNOSIS — E1110 Type 2 diabetes mellitus with ketoacidosis without coma: Secondary | ICD-10-CM | POA: Insufficient documentation

## 2021-08-22 DIAGNOSIS — Z794 Long term (current) use of insulin: Secondary | ICD-10-CM | POA: Insufficient documentation

## 2021-08-22 DIAGNOSIS — G8929 Other chronic pain: Secondary | ICD-10-CM | POA: Diagnosis not present

## 2021-08-22 DIAGNOSIS — M1711 Unilateral primary osteoarthritis, right knee: Secondary | ICD-10-CM | POA: Diagnosis not present

## 2021-08-22 DIAGNOSIS — I10 Essential (primary) hypertension: Secondary | ICD-10-CM | POA: Diagnosis not present

## 2021-08-22 DIAGNOSIS — R4020 Unspecified coma: Secondary | ICD-10-CM | POA: Diagnosis not present

## 2021-08-22 DIAGNOSIS — R410 Disorientation, unspecified: Secondary | ICD-10-CM | POA: Diagnosis not present

## 2021-08-22 LAB — CBG MONITORING, ED
Glucose-Capillary: 67 mg/dL — ABNORMAL LOW (ref 70–99)
Glucose-Capillary: 143 mg/dL — ABNORMAL HIGH (ref 70–99)
Glucose-Capillary: 259 mg/dL — ABNORMAL HIGH (ref 70–99)

## 2021-08-22 MED ORDER — GLUCAGON HCL RDNA (DIAGNOSTIC) 1 MG IJ SOLR
2.0000 mg | Freq: Once | INTRAMUSCULAR | Status: AC
  Administered 2021-08-22: 2 mg via INTRAMUSCULAR

## 2021-08-22 MED ORDER — OXYCODONE-ACETAMINOPHEN 5-325 MG PO TABS: 1.0000 | ORAL_TABLET | Freq: Four times a day (QID) | ORAL | 0 refills | Status: DC | PRN

## 2021-08-22 MED ORDER — ALUM & MAG HYDROXIDE-SIMETH 200-200-20 MG/5ML PO SUSP
30.0000 mL | Freq: Once | ORAL | Status: AC
  Administered 2021-08-22: 30 mL via ORAL
  Filled 2021-08-22: qty 30

## 2021-08-22 MED ORDER — GLUCAGON HCL RDNA (DIAGNOSTIC) 1 MG IJ SOLR
INTRAMUSCULAR | Status: AC
  Administered 2021-08-22: 2 mg
  Filled 2021-08-22: qty 1

## 2021-08-22 MED ORDER — DEXTROSE 50 % IV SOLN: 50.0000 mL | Freq: Once | INTRAVENOUS | Status: DC

## 2021-08-22 MED ORDER — GLUCOSE 40 % PO GEL
ORAL | Status: AC
  Filled 2021-08-22: qty 1

## 2021-08-22 MED ORDER — LACTATED RINGERS IV BOLUS
1000.0000 mL | Freq: Once | INTRAVENOUS | Status: AC
  Administered 2021-08-22: 1000 mL via INTRAVENOUS

## 2021-08-22 MED ORDER — DICYCLOMINE HCL 20 MG PO TABS: 20.0000 mg | ORAL_TABLET | Freq: Two times a day (BID) | ORAL | 0 refills | Status: DC

## 2021-08-22 MED ORDER — LIDOCAINE VISCOUS HCL 2 % MT SOLN
15.0000 mL | Freq: Once | OROMUCOSAL | Status: AC
  Administered 2021-08-22: 15 mL via ORAL
  Filled 2021-08-22: qty 15

## 2021-08-22 MED ORDER — FAMOTIDINE 20 MG PO TABS: 20.0000 mg | ORAL_TABLET | Freq: Two times a day (BID) | ORAL | 0 refills | Status: DC

## 2021-08-22 NOTE — ED Provider Notes (Signed)
[facility] EMERGENCY DEPARTMENT Provider Note   CSN: [id number] Arrival date & time: 08/22/21  9407     History Chief Complaint  Patient presents with   Hypoglycemia    Jill Shaw is a 45 y.o. female.  Level 5 caveat for altered mental status.  Patient found unresponsive at home for unknown period of time.  She was wedged between her bed and the wall.  EMS reported her blood sugar was 30.  They placed an IO and gave her 25 g of D10.  She is now more awake.  Last blood sugar was 70 at 6 AM. Vital stable.  No evidence of trauma.  Patient does not know what happened.  Does not member going to bed last night.  Does not member EMS arriving. Chart review shows history of diabetes, COPD, hypertension, sickle cell trait.  Multiple ED visits this week for abdominal pain and nausea and vomiting. She is prescribed 110 units of Lantus nightly as well as sliding scale insulin and oral hypoglycemics  The history is provided by the patient and the EMS personnel. The history is limited by the condition of the patient.  Hypoglycemia Associated symptoms: vomiting   Associated symptoms: no dizziness, no shortness of breath and no weakness       Past Medical History:  Diagnosis Date   Anemia    Arthritis    knees, hands   Asthma    Chronic diastolic (congestive) heart failure (HCC)    COPD (chronic obstructive pulmonary disease) (HCC)    Diabetes mellitus without complication ([place])    type 2   DKA (diabetic ketoacidosis) ([place]) 10/15/2020   Dysfunctional uterine bleeding    GERD (gastroesophageal reflux disease)    Hypertension    Neuromuscular disorder (HCC)    neuropathy feet   Seizures ([place]) 09/12/2017   pt states r/t stress and blood sugar - no meds last one 4 months ago, not seen neurologist   Sickle cell trait ([place])    Smoker    Vitamin D deficiency 10/2019   Wears glasses     Patient Active Problem List   Diagnosis Date Noted   Mixed hyperlipidemia  08/15/2021   Abnormal stress test 08/15/2021   DM (diabetes mellitus), type 2, uncontrolled, with renal complications 68/05/8109   Intractable nausea and vomiting 03/23/2021   Serotonin syndrome 03/14/2021   Overdose, undetermined intent, initial encounter 03/14/2021   Dyslipidemia 02/16/2021   Insulin resistance 02/16/2021   Type 2 diabetes mellitus without complication, with long-term current use of insulin ([place]) 02/16/2021   Amitriptyline overdose, accidental or unintentional, initial encounter 01/09/2021   Ingestion of substance, undetermined intent, initial encounter 01/08/2021   Hyperglycemia due to type 2 diabetes mellitus ([place]) 01/08/2021   SIRS (systemic inflammatory response syndrome) ([place]) 01/08/2021   Exertional chest pain 12/31/2020   SOB (shortness of breath) 12/24/2020   Hyperkalemia 10/15/2020   Sinus tachycardia 10/14/2020   Precordial pain 10/14/2020   Abnormal findings on diagnostic imaging of lung 08/19/2020   Physical deconditioning 08/19/2020   History of endometrial ablation 07/27/2020   History of alcohol use 07/20/2020   Current moderate episode of major depressive disorder without prior episode ([place]) 07/20/2020   Sickle cell trait ([place]) 07/19/2020   Elevated d-dimer 07/13/2020   Healthcare maintenance 07/12/2020   Exertional dyspnea 07/12/2020   Atypical chest pain 07/12/2020   At risk for obstructive sleep apnea 07/12/2020   Acute on chronic diastolic (congestive) heart failure ([place]) 06/22/2020   Generalized abdominal  pain 03/08/2020   Chronic diastolic CHF (congestive heart failure) (HCC) 03/08/2020   Chronic anemia 12/19/2019   Elevated sed rate 11/26/2019   Elevated C-reactive protein (CRP) 11/26/2019   Hypoglycemia 02/07/2019   Lactic acidosis 02/07/2019   Right ankle pain 02/07/2019   Chronic obstructive pulmonary disease ([place]) 02/05/2019   Vitamin D deficiency 05/31/2017   Gastroesophageal reflux disease 05/24/2017   Abnormal uterine bleeding  (AUB) 05/17/2017   Anemia of chronic disease 04/06/2017   Knee pain, chronic 03/27/2016   Controlled type 2 diabetes mellitus without complication, with long-term current use of insulin ([place]) 03/27/2016   Essential hypertension 03/27/2016   Obesity, Class III, BMI 40-49.9 (morbid obesity) ([place]) 03/27/2016   Irritable bowel syndrome with constipation 03/27/2016   Tobacco dependence 03/27/2016   Arthropathy, lower leg 05/04/2013   CTS (carpal tunnel syndrome) 05/04/2013   Chronic pain 05/13/2012   Diabetic gastroparesis ([place]) 11/06/2006   Hot flashes 11/06/2006    Past Surgical History:  Procedure Laterality Date   CESAREAN SECTION     x 1. for twins   DILATION AND CURETTAGE OF UTERUS N/A 08/20/2019   Procedure: DILATATION AND CURETTAGE;  Surgeon: Emily Filbert, MD;  Location: [place];  Service: Gynecology;  Laterality: N/A;   ENDOMETRIAL ABLATION N/A 08/20/2019   Procedure: [place] Ablation;  Surgeon: Emily Filbert, MD;  Location: [facility];  Service: Gynecology;  Laterality: N/A;   EYE SURGERY Bilateral    laser right and cataract removed left eye   RADIOLOGY WITH ANESTHESIA N/A 09/16/2019   Procedure: MRI WITH ANESTHESIA   L SPINE WITHOUT CONTRAST, T SPINE WITHOUT CONTRAST , CERVICAL WITHOUT CONTRAST;  Surgeon: Radiologist, Medication, MD;  Location: [facility];  Service: Radiology;  Laterality: N/A;   TUBAL LIGATION     interval BTL   UPPER GI ENDOSCOPY  07/2017     OB History     Gravida  3   Para      Term      Preterm      AB      Living  3      SAB      IAB      Ectopic      Multiple      Live Births  3        Obstetric Comments  Vaginal x 1 and then c-section for twins         Family History  Problem Relation Age of Onset   Diabetes Mother    Hypertension Mother     Social History   Tobacco Use   Smoking status: Some Days    Packs/day: 0.25    Years: 26.00    Pack years: 6.50    Types: Cigarettes    Last attempt to quit: 09/13/2020    Years  since quitting: 0.9   Smokeless tobacco: Never   Tobacco comments:    4-5 cigarettes/day  Vaping Use   Vaping Use: Never used  Substance Use Topics   Alcohol use: No   Drug use: No    Home Medications Prior to Admission medications   Medication Sig Start Date End Date Taking? Authorizing Provider  Accu-Chek Softclix Lancets lancets USE AS DIRECTED UP TO FOUR TIMES DAILY 02/17/21   Vevelyn Francois, NP  albuterol (PROVENTIL) (2.5 MG/3ML) 0.083% nebulizer solution Take 3 mLs (2.5 mg total) by nebulization every 6 (six) hours as needed for wheezing or shortness of breath. 07/18/21   Margaretha Seeds, MD  albuterol (  VENTOLIN HFA) 108 (90 Base) MCG/ACT inhaler Inhale 2 puffs into the lungs every 6 (six) hours as needed for wheezing or shortness of breath. 07/18/21   Margaretha Seeds, MD  aspirin EC 81 MG tablet Take 1 tablet (81 mg total) by mouth daily. 07/07/21   Patwardhan, Reynold Bowen, MD  blood glucose meter kit and supplies KIT 1 each by Other route See admin instructions. Dispense based on patient and insurance preference. Use up to four times daily as directed. (FOR ICD-9 250.00, 250.01). 09/02/20   Vevelyn Francois, NP  Blood Pressure Monitoring (ADULT BLOOD PRESSURE CUFF LG) KIT 1 kit by Does not apply route daily. 05/05/21   Vevelyn Francois, NP  budesonide-formoterol (SYMBICORT) 160-4.5 MCG/ACT inhaler INHALE 2 PUFFS INTO THE LUNGS 2 (TWO) TIMES DAILY. 07/18/21   Margaretha Seeds, MD  cetirizine (ZYRTEC) 10 MG tablet Take 1 tablet (10 mg total) by mouth daily. 06/07/21   Vevelyn Francois, NP  Continuous Blood Gluc Sensor (DEXCOM G6 SENSOR) MISC 1 Device by Does not apply route as directed. 06/27/21   Shamleffer, Melanie Crazier, MD  Continuous Blood Gluc Transmit (DEXCOM G6 TRANSMITTER) MISC 1 Device by Does not apply route as directed. 06/27/21   Shamleffer, Melanie Crazier, MD  Dexlansoprazole (DEXILANT) 30 MG capsule Take 1 capsule (30 mg total) by mouth daily. 04/13/21 04/13/22  Vevelyn Francois,  NP  dicyclomine (BENTYL) 20 MG tablet Take 1 tablet (20 mg total) by mouth 3 (three) times daily as needed for spasms. 08/21/21   Isla Pence, MD  empagliflozin (JARDIANCE) 25 MG TABS tablet Take 1 tablet (25 mg total) by mouth daily before breakfast. 06/27/21   Shamleffer, Melanie Crazier, MD  famotidine (PEPCID) 20 MG tablet Take 1 tablet (20 mg total) by mouth daily. 08/21/21   Isla Pence, MD  ferrous sulfate 325 (65 FE) MG tablet Take 1 tablet (325 mg total) by mouth 3 (three) times daily with meals. 12/02/20 12/02/21  Vevelyn Francois, NP  fluticasone (FLONASE) 50 MCG/ACT nasal spray Place 2 sprays into both nostrils daily. 12/02/20   Vevelyn Francois, NP  furosemide (LASIX) 40 MG tablet TAKE 1 TABLET(40 MG) BY MOUTH TWICE DAILY Patient taking differently: Take 40 mg by mouth 2 (two) times daily. 02/08/21   Lorretta Harp, MD  Glucosamine Sulfate 1000 MG CAPS Take 1 capsule (1,000 mg total) by mouth 2 (two) times daily. Patient taking differently: Take 1,000 mg by mouth 2 (two) times daily. 08/22/19   Hilts, Legrand Como, MD  glucose blood (ACCU-CHEK GUIDE) test strip USE AS DIRECTED UP TO FOUR TIMES DAILY 07/29/21   Passmore, Jake Church I, NP  haloperidol (HALDOL) 5 MG tablet Take 0.5-1 tablets (2.5-5 mg total) by mouth every 8 (eight) hours as needed (For nausea/vomiting). 08/20/21   Molpus, John, MD  Heating Pads (HEATING PAD MOIST/DRY KING SZ) PADS 1 each by Does not apply route 4 (four) times daily as needed. 02/14/21   Raulkar, Clide Deutscher, MD  hydrochlorothiazide (MICROZIDE) 12.5 MG capsule TAKE 1 CAPSULE(12.5 MG) BY MOUTH DAILY 08/03/21   Cantwell, Celeste C, PA-C  HYDROcodone-acetaminophen (NORCO) 10-325 MG tablet Take 1 tablet by mouth 3 (three) times daily as needed. Do Not Fill Before 07/28/2021: 07/25/21   Bayard Hugger, NP  hydrocortisone 2.5 % cream Apply topically 2 (two) times daily. Patient taking differently: Apply 1 application topically 2 (two) times daily. 09/22/20   Vevelyn Francois,  NP  hydroquinone 4 % cream APPLY TOPICALLY TWICE DAILY Patient  taking differently: Apply 1 application topically 2 (two) times daily as needed (pain). 09/13/20   Vevelyn Francois, NP  insulin glargine (LANTUS SOLOSTAR) 100 UNIT/ML Solostar Pen Inject 110 Units into the skin daily. 06/27/21   Shamleffer, Melanie Crazier, MD  insulin lispro (HUMALOG KWIKPEN) 200 UNIT/ML KwikPen Max daily 180 units 06/27/21   Shamleffer, Melanie Crazier, MD  Insulin Pen Needle 31G X 8 MM MISC 1 Device by Does not apply route in the morning, at noon, in the evening, and at bedtime. 02/16/21   Shamleffer, Melanie Crazier, MD  isosorbide mononitrate (IMDUR) 30 MG 24 hr tablet Take 1 tablet (30 mg total) by mouth daily. 07/07/21 10/05/21  Patwardhan, Reynold Bowen, MD  losartan (COZAAR) 100 MG tablet Take 1 tablet (100 mg total) by mouth daily. TAKE 1 TABLET($RemoveBefor'50MG'EstuAGiIJjFC$  TOTAL) BY MOUTH EVERY DAY 07/15/21 07/15/22  Patwardhan, Reynold Bowen, MD  metoprolol succinate (TOPROL-XL) 100 MG 24 hr tablet Take 100 mg by mouth daily. Take with or immediately following a meal.    [provider]  norethindrone (AYGESTIN) 5 MG tablet Take 3 tablets (15 mg total) by mouth 3 (three) times daily. With bleeding 07/25/21 08/24/21  Luvenia Redden, PA-C  ondansetron (ZOFRAN ODT) 4 MG disintegrating tablet Take 1 tablet (4 mg total) by mouth every 8 (eight) hours as needed for nausea or vomiting. 08/17/21   Sherwood Gambler, MD  potassium chloride SA (KLOR-CON) 20 MEQ tablet TAKE 1 TABLET(20 MEQ) BY MOUTH DAILY Patient taking differently: Take 20 mEq by mouth daily. 06/06/21   Lorretta Harp, MD  promethazine (PHENERGAN) 25 MG tablet Take 1 tablet (25 mg total) by mouth every 6 (six) hours as needed for nausea or vomiting. 08/17/21   Sherwood Gambler, MD  rosuvastatin (CRESTOR) 20 MG tablet Take 1 tablet (20 mg total) by mouth daily. 07/07/21   Patwardhan, Reynold Bowen, MD  saxagliptin HCl (ONGLYZA) 5 MG TABS tablet Take 1 tablet (5 mg total) by mouth daily. 12/02/20  12/02/21  Vevelyn Francois, NP  senna-docusate (SENOKOT-S) 8.6-50 MG tablet Take 1 tablet by mouth at bedtime as needed for mild constipation. 03/24/21   Debbe Odea, MD  spironolactone (ALDACTONE) 50 MG tablet TAKE 1 TABLET(50 MG) BY MOUTH DAILY Patient taking differently: Take 50 mg by mouth daily. 12/22/20   Patwardhan, Reynold Bowen, MD  sucralfate (CARAFATE) 1 g tablet Take 1 tablet (1 g total) by mouth 4 (four) times daily -  with meals and at bedtime. 08/21/21   Isla Pence, MD  Tiotropium Bromide Monohydrate (SPIRIVA RESPIMAT) 2.5 MCG/ACT AERS Inhale 2 puffs into the lungs daily. 07/18/21   Margaretha Seeds, MD  tiZANidine (ZANAFLEX) 4 MG tablet Take 1.5 tablets (6 mg total) by mouth every 6 (six) hours as needed for muscle spasms. 07/29/21   Bo Merino I, NP  TOBRADEX ophthalmic solution Place 1 drop into the right eye 4 (four) times daily. 04/30/21   [provider]  Vitamin D, Ergocalciferol, (DRISDOL) 1.25 MG (50000 UNIT) CAPS capsule Take 1 capsule (50,000 Units total) by mouth every Sunday for 8 doses. 08/21/21 10/10/21  Bo Merino I, NP  vitamin E (VITAMIN E) 180 MG (400 UNITS) capsule Take 1 capsule (400 Units total) by mouth daily. 01/07/21   Izora Ribas, MD    Allergies    Amitriptyline, Ketoprofen, Trazodone, Aspirin, Gabapentin, Ibuprofen, Liraglutide, Naproxen, Omeprazole-sodium bicarbonate, Sulfa antibiotics, and Tramadol  Review of Systems   Review of Systems  Unable to perform ROS: Mental status change  Constitutional:  Negative for fever.  HENT:  Negative for congestion and rhinorrhea.   Respiratory:  Negative for cough, chest tightness and shortness of breath.   Cardiovascular:  Negative for chest pain.  Gastrointestinal:  Positive for abdominal pain, nausea and vomiting.  Genitourinary:  Negative for dysuria and hematuria.  Musculoskeletal:  Negative for arthralgias and myalgias.  Neurological:  Negative for dizziness, weakness and headaches.    Physical Exam Updated Vital Signs BP (!) 118/95   Pulse 82   Temp 97.9 F (36.6 C) (Oral)   Resp (!) 23   SpO2 100%   Physical Exam Vitals and nursing note reviewed.  Constitutional:      General: She is not in acute distress.    Appearance: She is well-developed. She is ill-appearing.     Comments: Somnolent, slow to respond Oriented to person and place  HENT:     Head: Normocephalic and atraumatic.     Comments: No scalp hematomas or abrasions    Mouth/Throat:     Pharynx: No oropharyngeal exudate.  Eyes:     Conjunctiva/sclera: Conjunctivae normal.     Pupils: Pupils are equal, round, and reactive to light.  Neck:     Comments: No meningismus. Cardiovascular:     Rate and Rhythm: Normal rate and regular rhythm.     Heart sounds: Normal heart sounds. No murmur heard. Pulmonary:     Effort: Pulmonary effort is normal. No respiratory distress.     Breath sounds: Normal breath sounds.  Abdominal:     General: There is distension.     Palpations: Abdomen is soft.     Tenderness: There is abdominal tenderness. There is no guarding or rebound.  Musculoskeletal:        General: No tenderness. Normal range of motion.     Cervical back: Normal range of motion and neck supple.  Skin:    General: Skin is warm.  Neurological:     Mental Status: She is alert.     Cranial Nerves: No cranial nerve deficit.     Motor: No abnormal muscle tone.     Coordination: Coordination normal.     Comments: Oriented to person and place.  5/5 strength throughout, no facial droop.  Tongue is midline.  Psychiatric:        Behavior: Behavior normal.    ED Results / Procedures / Treatments   Labs (all labs ordered are listed, but only abnormal results are displayed) Labs Reviewed  CBG MONITORING, ED - Abnormal; Notable for the following components:      Result Value   Glucose-Capillary 67 (*)    All other components within normal limits  CBG MONITORING, ED - Abnormal; Notable for the  following components:   Glucose-Capillary 143 (*)    All other components within normal limits  CBC WITH DIFFERENTIAL/PLATELET  COMPREHENSIVE METABOLIC PANEL  URINALYSIS, ROUTINE W REFLEX MICROSCOPIC  I-STAT BETA HCG BLOOD, ED ([facility], [facility], [facility] ONLY)    EKG EKG Interpretation  Date/Time:  Monday August 22 2021 06:21:37 EST Ventricular Rate:  82 PR Interval:  171 QRS Duration: 99 QT Interval:  423 QTC Calculation: 495 R Axis:   78 Text Interpretation: Sinus rhythm Borderline repolarization abnormality Borderline prolonged QT interval No significant change was found Confirmed by Ezequiel Essex [phone]) on 08/22/2021 6:44:59 AM  Radiology DG Chest Portable 1 View  Result Date: 08/22/2021 CLINICAL DATA:  45 year old female with hypoglycemia. EXAM: PORTABLE CHEST 1 VIEW COMPARISON:  Chest radiographs 07/18/2021 and earlier. FINDINGS:  Portable AP semi upright view at 0631 hours. Lung volumes and mediastinal contours within normal limits. Visualized tracheal air column is within normal limits. Allowing for portable technique the lungs are clear. No pneumothorax or pleural effusion. No acute osseous abnormality identified. IMPRESSION: Negative portable chest. Electronically Signed   By: Genevie Ann M.D.   On: 08/22/2021 06:50    Procedures .Critical Care Performed by: Ezequiel Essex, MD Authorized by: Ezequiel Essex, MD   Critical care provider statement:    Critical care time (minutes):  35   Critical care time was exclusive of:  Separately billable procedures and treating other patients   Critical care was necessary to treat or prevent imminent or life-threatening deterioration of the following conditions: hypoglycemia.   Critical care was time spent personally by me on the following activities:  Blood draw for specimens, development of treatment plan with patient or surrogate, ordering and performing treatments and interventions, ordering and review of laboratory studies, ordering and  review of radiographic studies, pulse oximetry, evaluation of patient's response to treatment, examination of patient, review of old charts and re-evaluation of patient's condition   I assumed direction of critical care for this patient from another provider in my specialty: no     Medications Ordered in ED Medications  dextrose (GLUTOSE) 40 % oral gel (has no administration in time range)  dextrose 50 % solution 50 mL (has no administration in time range)  glucagon (human recombinant) (GLUCAGEN) injection 2 mg (has no administration in time range)  glucagon (human recombinant) (GLUCAGEN) 1 MG injection (2 mg  Given 08/22/21 5449)    ED Course  I have reviewed the triage vital signs and the nursing notes.  Pertinent labs & imaging results that were available during my care of the patient were reviewed by me and considered in my medical decision making (see chart for details).  Clinical Course as of 08/22/21 0723  Mon Aug 22, 2021  0708 Hypoglycemia, getting glucagon and D50, pending labs, extended OBS [MK]    Clinical Course User Index [MK] Kommor, [place], MD   MDM Rules/Calculators/A&P                          Patient found down at home with hypoglycemia and altered mental status.  Mentation is improving with sugar replacement.  She is asking for food.  Suspect likely poor p.o. intake in setting of her recent nausea and vomiting.  Blood sugar has improved to 143.  Labs, urinalysis and chest x-ray will be obtained to evaluate for infectious pathology.  Also given unclear downtime with possible trauma CT head will be obtained.  Patient awake and alert.  She is not clear what happened last night.  She does not member coming to the hospital.  She cannot tell me what she takes for her diabetes.  Suspect likely overmedication in setting of poor p.o. intake.  Will require prolonged observation to ensure no further episodes of hypoglycemia.  Care transferred at shift change to Dr.  Matilde Sprang. Final Clinical Impression(s) / ED Diagnoses Final diagnoses:  None    Rx / DC Orders ED Discharge Orders     None        Jaysun Wessels, Annie Main, MD 08/22/21 [id number]

## 2021-08-22 NOTE — Telephone Encounter (Signed)
Transition Care Management Unsuccessful Follow-up Telephone Call  Date of discharge and from where:  08/17/2021 from [facility]  Attempts:  3rd Attempt  Reason for unsuccessful TCM follow-up call:  Unable to reach patient

## 2021-08-22 NOTE — Discharge Instructions (Addendum)
You were seen in the emergency department for evaluation of abdominal pain and low blood sugar.  And concerned that your low blood sugar may be due to decreased ability to eat in the setting of your pain.  It appears that your pain worsens after eating and this is usually seen in the setting of acid reflux.  We discussed multiple pain management plans, and our current plan is for you to take your Carafate and Pepcid when you start to feel abdominal pain coming on.  If this does not work, please take the Bentyl.  If that does not work, there is a short course of oxycodone available for you for breakthrough pain only.  It is very important that you call the GI doctors to set up an appointment to have a discussion about gastritis versus gastroparesis as a cause of her symptoms today.  Department if you have new or worsening vomiting, fevers, abdominal pain or any other concerning symptoms.

## 2021-08-22 NOTE — ED Triage Notes (Signed)
Pt from Home, Mom found her unresponsive, unknown down time.  EMS found her CBG 30, placed IO (unable to get IV) gave 25g D10.  Pt is now responsive.    146/87 HR 75 98% RA

## 2021-08-22 NOTE — ED Notes (Signed)
Two RNs and phlebotomy attempted to get blood, unsuccessful.  Another phlebotomy staff member is trying now.    Tec took CBG at 7am, 110.  It is not crossing over, another tec is getting CBG now.

## 2021-08-23 ENCOUNTER — Telehealth: Payer: Self-pay

## 2021-08-23 ENCOUNTER — Ambulatory Visit: Payer: Medicaid Other

## 2021-08-23 NOTE — Telephone Encounter (Signed)
Transition Care Management Unsuccessful Follow-up Telephone Call  Date of discharge and from where:  08/22/2021-[facility]   Attempts:  1st Attempt  Reason for unsuccessful TCM follow-up call:  Left voice message

## 2021-08-24 NOTE — Telephone Encounter (Signed)
Transition Care Management Unsuccessful Follow-up Telephone Call  Date of discharge and from where:  08/22/2021-[facility]   Attempts:  2nd Attempt  Reason for unsuccessful TCM follow-up call:  Left voice message

## 2021-08-25 ENCOUNTER — Telehealth: Payer: Self-pay | Admitting: Gastroenterology

## 2021-08-25 ENCOUNTER — Other Ambulatory Visit: Payer: Self-pay | Admitting: Cardiovascular Disease

## 2021-08-25 ENCOUNTER — Encounter
Payer: Medicaid Other | Attending: Physical Medicine and Rehabilitation | Admitting: Physical Medicine and Rehabilitation

## 2021-08-25 ENCOUNTER — Other Ambulatory Visit: Payer: Self-pay

## 2021-08-25 VITALS — BP 87/60 | HR 84 | Temp 98.9°F | Ht 62.0 in | Wt 266.6 lb

## 2021-08-25 DIAGNOSIS — M170 Bilateral primary osteoarthritis of knee: Secondary | ICD-10-CM | POA: Insufficient documentation

## 2021-08-25 DIAGNOSIS — M792 Neuralgia and neuritis, unspecified: Secondary | ICD-10-CM | POA: Insufficient documentation

## 2021-08-25 DIAGNOSIS — M5416 Radiculopathy, lumbar region: Secondary | ICD-10-CM | POA: Diagnosis not present

## 2021-08-25 DIAGNOSIS — I10 Essential (primary) hypertension: Secondary | ICD-10-CM

## 2021-08-25 DIAGNOSIS — G894 Chronic pain syndrome: Secondary | ICD-10-CM | POA: Diagnosis not present

## 2021-08-25 DIAGNOSIS — M25561 Pain in right knee: Secondary | ICD-10-CM | POA: Insufficient documentation

## 2021-08-25 DIAGNOSIS — M25511 Pain in right shoulder: Secondary | ICD-10-CM | POA: Insufficient documentation

## 2021-08-25 MED ORDER — HYDROCODONE-ACETAMINOPHEN 10-325 MG PO TABS: 1.0000 | ORAL_TABLET | Freq: Three times a day (TID) | ORAL | 0 refills | Status: DC | PRN

## 2021-08-25 NOTE — Telephone Encounter (Signed)
Transition Care Management Unsuccessful Follow-up Telephone Call  Date of discharge and from where:  08/22/2021 from [facility]  Attempts:  3rd Attempt  Reason for unsuccessful TCM follow-up call:  Unable to reach patient

## 2021-08-25 NOTE — Telephone Encounter (Signed)
Scheduled for 09/02/21.

## 2021-08-25 NOTE — Progress Notes (Signed)
Subjective:    Patient ID: Jill Shaw, female    DOB: 1976/06/20, 45 y.o.   MRN: [id number]   HPI: Jill Shaw is a 45 y.o. female who returns for f/u appointment for chronic pain, diabetic peripheral neuropathy, and insomnia.   1) Insomnia: -She continues to experience insomnia at night. Asks about Ambien and I discussed that this is an addictive medication and there are other safer options we can try first that can also help with her pain.  -She is currently not taking either of these medications, or Amitriptyline or Trazodone.  -She takes Requip for resltless legs but this does not help. -She is still sleeping very poorly -She does use screens before bed time  2) Back pain secondary to lumbar radiculitis.  -She states her pain is located in her lower back radiating into her bilateral lower extremities and bilateral knee pain. She denies falling. She rates her pain 9. Her current exercise regime is walking.  -Ms. Waiters Morphine equivalent is 20.00 MME.  -She was hospitalized for serotonin syndrome after use of Savella and Cymbalta.  -Back pain has been severe -She requests a renewal of her handicap placard as she is unable to walk 200 feet without stopping to rest.  -She requests a refill of her Norco which is due today.   3) Diabetic peripheral neuropathy -She is ready to try Qutenza today. -Her peripheral neuropathy is worse in her bilateral lower extremities, especially the dorsum and soles of both feet, including the toes.    Pain Inventory Average Pain 9 Pain Right Now 9 My pain is sharp, burning, tingling and aching  In the last 24 hours, has pain interfered with the following? General activity 0 Relation with others 0 Enjoyment of life 5 What TIME of day is your pain at its worst? morning , daytime, evening and night Sleep (in general) Poor  Pain is worse with: walking, bending, sitting, inactivity, standing and some activites Pain improves with: heat/ice  and medication Relief from Meds: 10  Family History  Problem Relation Age of Onset   Diabetes Mother    Hypertension Mother    Social History   Socioeconomic History   Marital status: Legally Separated    Spouse name: Not on file   Number of children: 3   Years of education: Not on file   Highest education level: Not on file  Occupational History   Occupation: unemployed  Tobacco Use   Smoking status: Some Days    Packs/day: 0.25    Years: 26.00    Pack years: 6.50    Types: Cigarettes    Last attempt to quit: 09/13/2020    Years since quitting: 0.9   Smokeless tobacco: Never   Tobacco comments:    4-5 cigarettes/day  Vaping Use   Vaping Use: Never used  Substance and Sexual Activity   Alcohol use: No   Drug use: No   Sexual activity: Not Currently    Birth control/protection: None  Other Topics Concern   Not on file  Social History Narrative   Right Handed   Lives in a one story apartment, but lives on the second floor   Drinks caffeine once in awhile   Social Determinants of Health   Financial Resource Strain: Not on file  Food Insecurity: No Food Insecurity   Worried About Programme researcher, broadcasting/film/video in the Last Year: Never true   Ran Out of Food in the Last Year: Never true  Transportation Needs:  No Transportation Needs   Lack of Transportation (Medical): No   Lack of Transportation (Non-Medical): No  Physical Activity: Not on file  Stress: Not on file  Social Connections: Not on file   Past Surgical History:  Procedure Laterality Date   CESAREAN SECTION     x 1. for twins   DILATION AND CURETTAGE OF UTERUS N/A 08/20/2019   Procedure: DILATATION AND CURETTAGE;  Surgeon: Allie Bossier, MD;  Location: [facility] OR;  Service: Gynecology;  Laterality: N/A;   ENDOMETRIAL ABLATION N/A 08/20/2019   Procedure: Minerva Ablation;  Surgeon: Allie Bossier, MD;  Location: [facility] OR;  Service: Gynecology;  Laterality: N/A;   EYE SURGERY Bilateral    laser right and cataract removed  left eye   RADIOLOGY WITH ANESTHESIA N/A 09/16/2019   Procedure: MRI WITH ANESTHESIA   L SPINE WITHOUT CONTRAST, T SPINE WITHOUT CONTRAST , CERVICAL WITHOUT CONTRAST;  Surgeon: Radiologist, Medication, MD;  Location: [facility] OR;  Service: Radiology;  Laterality: N/A;   TUBAL LIGATION     interval BTL   UPPER GI ENDOSCOPY  07/2017   Past Surgical History:  Procedure Laterality Date   CESAREAN SECTION     x 1. for twins   DILATION AND CURETTAGE OF UTERUS N/A 08/20/2019   Procedure: DILATATION AND CURETTAGE;  Surgeon: Allie Bossier, MD;  Location: [facility] OR;  Service: Gynecology;  Laterality: N/A;   ENDOMETRIAL ABLATION N/A 08/20/2019   Procedure: Minerva Ablation;  Surgeon: Allie Bossier, MD;  Location: [facility];  Service: Gynecology;  Laterality: N/A;   EYE SURGERY Bilateral    laser right and cataract removed left eye   RADIOLOGY WITH ANESTHESIA N/A 09/16/2019   Procedure: MRI WITH ANESTHESIA   L SPINE WITHOUT CONTRAST, T SPINE WITHOUT CONTRAST , CERVICAL WITHOUT CONTRAST;  Surgeon: Radiologist, Medication, MD;  Location: [facility] OR;  Service: Radiology;  Laterality: N/A;   TUBAL LIGATION     interval BTL   UPPER GI ENDOSCOPY  07/2017   Past Medical History:  Diagnosis Date   Anemia    Arthritis    knees, hands   Asthma    Chronic diastolic (congestive) heart failure (HCC)    COPD (chronic obstructive pulmonary disease) (HCC)    Diabetes mellitus without complication (HCC)    type 2   DKA (diabetic ketoacidosis) (HCC) 10/15/2020   Dysfunctional uterine bleeding    GERD (gastroesophageal reflux disease)    Hypertension    Neuromuscular disorder (HCC)    neuropathy feet   Seizures (HCC) 09/12/2017   pt states r/t stress and blood sugar - no meds last one 4 months ago, not seen neurologist   Sickle cell trait (HCC)    Smoker    Vitamin D deficiency 10/2019   Wears glasses    BP (!) 87/60   Pulse 84   Temp 98.9 F (37.2 C) (Oral)   Ht 5\' 2"  (1.575 m)   Wt 266 lb 9.6 oz (120.9 kg)   SpO2 96%    BMI 48.76 kg/m   Opioid Risk Score:   Fall Risk Score:  `1  Depression screen PHQ 2/9  Depression screen [facility] 2/9 08/01/2021 07/29/2021 06/14/2021 04/15/2021 03/30/2021 03/22/2021 01/31/2021  Decreased Interest 1 2 0 2 0 1 1  Down, Depressed, Hopeless 1 2 0 2 0 1 1  PHQ - 2 Score 2 4 0 4 0 2 2  Altered sleeping - 3 - 3 - - -  Tired, decreased energy - 2 - 3 - - -  Change in appetite - 0 - 0 - - -  Feeling bad or failure about yourself  - - - 0 - - -  Trouble concentrating - 2 - 2 - - -  Moving slowly or fidgety/restless - 0 - 0 - - -  Suicidal thoughts - 0 - 0 - - -  PHQ-9 Score - 11 - 12 - - -  Difficult doing work/chores - - - - - - -  Some recent data might be hidden     Review of Systems  Constitutional:  Positive for diaphoresis and unexpected weight change.  Respiratory:  Positive for shortness of breath and wheezing.   Gastrointestinal:  Positive for abdominal pain, constipation and nausea.  Musculoskeletal:  Positive for arthralgias, back pain, gait problem and myalgias.  Neurological:  Positive for dizziness, weakness and numbness.  Hematological:  Bruises/bleeds easily.  Psychiatric/Behavioral:  Positive for confusion and decreased concentration. The patient is nervous/anxious.   All other systems reviewed and are negative.     Objective:  Gen: no distress, normal appearing, BMI 48.67, weight 266 lbs HEENT: oral mucosa pink and moist, NCAT Cardio: Reg rate Chest: normal effort, normal rate of breathing Abd: soft, non-distended Ext: no edema Psych: pleasant, normal affect Skin: no open ulcers on feet. No onchymycosis.  Neuro: Alert and oriented     Assessment & Plan:  1. Lumbar Radiculitis: Continue current medication regimen. Continue HEP as Tolerated. Provided with handicap placard.  2. Fibromyalgia: Continue HEP as Tolerated. Continue current Medication regimen. Continue to Monitor.  3. Bilateral Knee Pain:RX: Bilateral Knee X-rays. Continue to Monitor.  4.  Chronic Pain Syndrome: Refilled Hydrocodone 10/325mg  one tablet twice a day as needed for pain #60. We will continue the opioid monitoring program, this consists of regular clinic visits, examinations, urine drug screen, pill counts as well as use of [place] Controlled Substance Reporting system. A 12 month History has been reviewed on the [place] Controlled Substance Reporting System on 02/23/2021. 5. Insomnia: -Try to go outside near sunrise -Get exercise during the day.  -Discussed good sleep hygiene: turning off all devices an hour before bedtime.  -Chamomile tea with dinner.  -Melatonin did for help. -warm bath or shower before bed. -apply lavender oil for forehead at night 6. Diabetic peripheral neuropathy -Discussed Qutenza as an option for neuropathic pain control. Discussed that this is a capsaicin patch, stronger than capsaicin cream. Discussed that it is currently approved for diabetic peripheral neuropathy and post-herpetic neuralgia, but that it has also shown benefit in treating other forms of neuropathy. Provided patient with link to site to learn more about the patch: https://www.clark.biz/. Discussed that the patch would be placed in office and benefits usually last 3 months. Discussed that unintended exposure to capsaicin can cause severe irritation of eyes, mucous membranes, respiratory tract, and skin, but that Qutenza is a local treatment and does not have the systemic side effects of other nerve medications. Discussed that there may be pain, itching, erythema, and decreased sensory function associated with the application of Qutenza. Side effects usually subside within 1 week. A cold pack of analgesic medications can help with these side effects. Blood pressure can also be increased due to pain associated with administration of the patch.  4 patches of Qutenza was applied to the area of pain. Ice packs were applied during the procedure to ensure patient comfort. Blood  pressure was monitored every 15 minutes. The patient tolerated the procedure well. Post-procedure instructions were given and follow-up  has been scheduled.

## 2021-08-25 NOTE — Telephone Encounter (Signed)
Hi Dr. Lavon Paganini,  We received a referral from the ED for patient to be seen. It looks like she is a current patient over at [facility] and was suppose to have an EGD\Colon procedure done. Spoke with patient she is requesting a transfer of care due to location\transportation. Records are available via Epic for you to review and advise on scheduling.   Thanks

## 2021-08-25 NOTE — Telephone Encounter (Signed)
Ok to schedule with any next available provider given she is new to our practice, and is interested in transferring due to proximity

## 2021-08-26 ENCOUNTER — Telehealth: Payer: Self-pay

## 2021-08-26 MED ORDER — DICYCLOMINE HCL 20 MG PO TABS: 20.0000 mg | ORAL_TABLET | Freq: Two times a day (BID) | ORAL | 0 refills | Status: DC

## 2021-08-26 NOTE — Telephone Encounter (Signed)
RX refilled. Patient notified.

## 2021-08-26 NOTE — Telephone Encounter (Signed)
Dicyclomine 20 mg Refill   Pt will need a nurse call back with medication questions.

## 2021-08-26 NOTE — Addendum Note (Signed)
Addended by: Eduard Clos on: 08/26/2021 02:59 PM   Modules accepted: Orders

## 2021-08-30 ENCOUNTER — Ambulatory Visit (HOSPITAL_COMMUNITY)
Admission: RE | Admit: 2021-08-30 | Discharge: 2021-08-30 | Disposition: A | Discharge status: Home / Self Care | Payer: Medicaid Other | Source: Ambulatory Visit | Attending: Cardiology | Admitting: Cardiology

## 2021-08-30 ENCOUNTER — Ambulatory Visit (HOSPITAL_COMMUNITY)
Admission: RE | Disposition: A | Discharge status: Home / Self Care | Payer: Self-pay | Source: Ambulatory Visit | Attending: Cardiology

## 2021-08-30 ENCOUNTER — Other Ambulatory Visit: Payer: Self-pay

## 2021-08-30 ENCOUNTER — Encounter (HOSPITAL_COMMUNITY): Payer: Self-pay | Admitting: Cardiology

## 2021-08-30 DIAGNOSIS — E119 Type 2 diabetes mellitus without complications: Secondary | ICD-10-CM | POA: Diagnosis not present

## 2021-08-30 DIAGNOSIS — F17200 Nicotine dependence, unspecified, uncomplicated: Secondary | ICD-10-CM | POA: Insufficient documentation

## 2021-08-30 DIAGNOSIS — J4540 Moderate persistent asthma, uncomplicated: Secondary | ICD-10-CM | POA: Diagnosis not present

## 2021-08-30 DIAGNOSIS — R079 Chest pain, unspecified: Secondary | ICD-10-CM | POA: Diagnosis not present

## 2021-08-30 DIAGNOSIS — R9439 Abnormal result of other cardiovascular function study: Secondary | ICD-10-CM | POA: Diagnosis not present

## 2021-08-30 DIAGNOSIS — Z6841 Body Mass Index (BMI) 40.0 and over, adult: Secondary | ICD-10-CM | POA: Diagnosis not present

## 2021-08-30 DIAGNOSIS — I509 Heart failure, unspecified: Secondary | ICD-10-CM | POA: Diagnosis not present

## 2021-08-30 DIAGNOSIS — I110 Hypertensive heart disease with heart failure: Secondary | ICD-10-CM | POA: Diagnosis not present

## 2021-08-30 DIAGNOSIS — R072 Precordial pain: Secondary | ICD-10-CM | POA: Insufficient documentation

## 2021-08-30 DIAGNOSIS — D509 Iron deficiency anemia, unspecified: Secondary | ICD-10-CM | POA: Insufficient documentation

## 2021-08-30 HISTORY — PX: LEFT HEART CATH AND CORONARY ANGIOGRAPHY: CATH118249

## 2021-08-30 LAB — POCT I-STAT, CHEM 8
BUN: 11 mg/dL (ref 6–20)
Calcium, Ion: 1.21 mmol/L (ref 1.15–1.40)
Chloride: 101 mmol/L (ref 98–111)
Creatinine, Ser: 0.9 mg/dL (ref 0.44–1.00)
Glucose, Bld: 80 mg/dL (ref 70–99)
HCT: 35 % — ABNORMAL LOW (ref 36.0–46.0)
Hemoglobin: 11.9 g/dL — ABNORMAL LOW (ref 12.0–15.0)
Potassium: 3.3 mmol/L — ABNORMAL LOW (ref 3.5–5.1)
Sodium: 142 mmol/L (ref 135–145)
TCO2: 27 mmol/L (ref 22–32)

## 2021-08-30 LAB — GLUCOSE, CAPILLARY
Glucose-Capillary: 109 mg/dL — ABNORMAL HIGH (ref 70–99)
Glucose-Capillary: 80 mg/dL (ref 70–99)

## 2021-08-30 SURGERY — LEFT HEART CATH AND CORONARY ANGIOGRAPHY
Anesthesia: LOCAL

## 2021-08-30 MED ORDER — HEPARIN SODIUM (PORCINE) 1000 UNIT/ML IJ SOLN
INTRAMUSCULAR | Status: DC | PRN
  Administered 2021-08-30: 6000 [IU] via INTRAVENOUS

## 2021-08-30 MED ORDER — SODIUM CHLORIDE 0.9 % IV SOLN: INTRAVENOUS | Status: DC

## 2021-08-30 MED ORDER — ASPIRIN 81 MG PO CHEW
CHEWABLE_TABLET | ORAL | Status: AC
  Filled 2021-08-30: qty 1

## 2021-08-30 MED ORDER — CLOPIDOGREL BISULFATE 75 MG PO TABS
ORAL_TABLET | ORAL | Status: AC
  Administered 2021-08-30: 75 mg via ORAL
  Filled 2021-08-30: qty 1

## 2021-08-30 MED ORDER — MIDAZOLAM HCL 2 MG/2ML IJ SOLN
INTRAMUSCULAR | Status: AC
  Filled 2021-08-30: qty 2

## 2021-08-30 MED ORDER — FENTANYL CITRATE (PF) 100 MCG/2ML IJ SOLN
INTRAMUSCULAR | Status: DC | PRN
  Administered 2021-08-30: 50 ug via INTRAVENOUS

## 2021-08-30 MED ORDER — ASPIRIN 81 MG PO CHEW
CHEWABLE_TABLET | ORAL | Status: AC
  Administered 2021-08-30: 81 mg via ORAL
  Filled 2021-08-30: qty 1

## 2021-08-30 MED ORDER — CLOPIDOGREL BISULFATE 75 MG PO TABS: 75.0000 mg | ORAL_TABLET | ORAL | Status: AC

## 2021-08-30 MED ORDER — HYDRALAZINE HCL 20 MG/ML IJ SOLN
10.0000 mg | INTRAMUSCULAR | Status: DC | PRN
  Administered 2021-08-30: 5 mg via INTRAVENOUS
  Filled 2021-08-30: qty 1

## 2021-08-30 MED ORDER — HEPARIN (PORCINE) IN NACL 1000-0.9 UT/500ML-% IV SOLN
INTRAVENOUS | Status: DC | PRN
  Administered 2021-08-30 (×2): 500 mL via INTRA_ARTERIAL

## 2021-08-30 MED ORDER — ONDANSETRON HCL 4 MG/2ML IJ SOLN: 4.0000 mg | Freq: Four times a day (QID) | INTRAMUSCULAR | Status: DC | PRN

## 2021-08-30 MED ORDER — ACETAMINOPHEN 325 MG PO TABS: 650.0000 mg | ORAL_TABLET | ORAL | Status: DC | PRN

## 2021-08-30 MED ORDER — SODIUM CHLORIDE 0.9 % IV SOLN: 250.0000 mL | INTRAVENOUS | Status: DC | PRN

## 2021-08-30 MED ORDER — HEPARIN (PORCINE) IN NACL 1000-0.9 UT/500ML-% IV SOLN
INTRAVENOUS | Status: AC
  Filled 2021-08-30: qty 500

## 2021-08-30 MED ORDER — MIDAZOLAM HCL 2 MG/2ML IJ SOLN
INTRAMUSCULAR | Status: DC | PRN
  Administered 2021-08-30: 2 mg via INTRAVENOUS

## 2021-08-30 MED ORDER — VERAPAMIL HCL 2.5 MG/ML IV SOLN
INTRAVENOUS | Status: DC | PRN
  Administered 2021-08-30: 10 mL via INTRA_ARTERIAL

## 2021-08-30 MED ORDER — ASPIRIN 81 MG PO CHEW: 81.0000 mg | CHEWABLE_TABLET | Freq: Once | ORAL | Status: AC

## 2021-08-30 MED ORDER — SODIUM CHLORIDE 0.9% FLUSH: 3.0000 mL | INTRAVENOUS | Status: DC | PRN

## 2021-08-30 MED ORDER — LIDOCAINE HCL (PF) 1 % IJ SOLN
INTRAMUSCULAR | Status: AC
  Filled 2021-08-30: qty 30

## 2021-08-30 MED ORDER — FENTANYL CITRATE (PF) 100 MCG/2ML IJ SOLN
INTRAMUSCULAR | Status: AC
  Filled 2021-08-30: qty 2

## 2021-08-30 MED ORDER — HEPARIN SODIUM (PORCINE) 1000 UNIT/ML IJ SOLN
INTRAMUSCULAR | Status: AC
  Filled 2021-08-30: qty 1

## 2021-08-30 MED ORDER — LABETALOL HCL 5 MG/ML IV SOLN
10.0000 mg | INTRAVENOUS | Status: DC | PRN
  Administered 2021-08-30 (×2): 10 mg via INTRAVENOUS
  Filled 2021-08-30: qty 4

## 2021-08-30 MED ORDER — SODIUM CHLORIDE 0.9% FLUSH: 3.0000 mL | Freq: Two times a day (BID) | INTRAVENOUS | Status: DC

## 2021-08-30 MED ORDER — POTASSIUM CHLORIDE CRYS ER 20 MEQ PO TBCR: 40.0000 meq | EXTENDED_RELEASE_TABLET | Freq: Once | ORAL | Status: DC

## 2021-08-30 MED ORDER — SODIUM CHLORIDE 0.9 % WEIGHT BASED INFUSION
3.0000 mL/kg/h | INTRAVENOUS | Status: AC
  Administered 2021-08-30: 3 mL/kg/h via INTRAVENOUS

## 2021-08-30 MED ORDER — VERAPAMIL HCL 2.5 MG/ML IV SOLN
INTRAVENOUS | Status: AC
  Filled 2021-08-30: qty 2

## 2021-08-30 MED ORDER — SODIUM CHLORIDE 0.9 % WEIGHT BASED INFUSION: 1.0000 mL/kg/h | INTRAVENOUS | Status: DC

## 2021-08-30 SURGICAL SUPPLY — 11 items
CATH OPTITORQUE TIG 4.0 5F (CATHETERS) ×2 IMPLANT
DEVICE RAD COMP TR BAND LRG (VASCULAR PRODUCTS) ×2 IMPLANT
GLIDESHEATH SLEND A-KIT 6F 22G (SHEATH) ×4 IMPLANT
GUIDEWIRE INQWIRE 1.5J.035X260 (WIRE) ×1 IMPLANT
INQWIRE 1.5J .035X260CM (WIRE) ×2
KIT HEART LEFT (KITS) ×2 IMPLANT
MAT PREVALON FULL STRYKER (MISCELLANEOUS) ×2 IMPLANT
PACK CARDIAC CATHETERIZATION (CUSTOM PROCEDURE TRAY) ×2 IMPLANT
SYR MEDRAD MARK 7 150ML (SYRINGE) IMPLANT
TRANSDUCER W/STOPCOCK (MISCELLANEOUS) ×2 IMPLANT
TUBING CIL FLEX 10 FLL-RA (TUBING) ×2 IMPLANT

## 2021-08-30 NOTE — H&P (Signed)
OV 08/15/2021 copied for documentation     Patient referred by Vevelyn Francois, NP for congestive heart failure  Subjective:   Jill Shaw, female    DOB: Jul 08, 1976, 45 y.o.   MRN: [id number]   Chief Complaint  Patient presents with   Chest Pain   Follow-up    4-6 week     HPI  45 year old African-American female with hypertension, type 2 diabetes mellitus, HFpEF, moderate persistent asthma, tobacco dependence, morbid obesity, microcytic anemia, uterine dysfunction  Patient has continued to have chest pain symptoms on daily basis.  She also reports 3 pillow orthopnea.  On a separate note, she reports to me that she has had, what she calls as "strokes ".  She reportedly has slurring of speech with lips feeling numb.  These episodes have been going on for several years, occur once-twice a month.   Current Outpatient Medications on File Prior to Visit  Medication Sig Dispense Refill   Accu-Chek Softclix Lancets lancets USE AS DIRECTED UP TO FOUR TIMES DAILY 100 each 3   albuterol (PROVENTIL) (2.5 MG/3ML) 0.083% nebulizer solution Take 3 mLs (2.5 mg total) by nebulization every 6 (six) hours as needed for wheezing or shortness of breath. 150 mL 6   albuterol (VENTOLIN HFA) 108 (90 Base) MCG/ACT inhaler Inhale 2 puffs into the lungs every 6 (six) hours as needed for wheezing or shortness of breath. 8 g 6   aspirin EC 81 MG tablet Take 1 tablet (81 mg total) by mouth daily. 90 tablet 3   blood glucose meter kit and supplies KIT 1 each by Other route See admin instructions. Dispense based on patient and insurance preference. Use up to four times daily as directed. (FOR ICD-9 250.00, 250.01). 1 each 2   Blood Pressure Monitoring (ADULT BLOOD PRESSURE CUFF LG) KIT 1 kit by Does not apply route daily. 1 kit 0   budesonide-formoterol (SYMBICORT) 160-4.5 MCG/ACT inhaler INHALE 2 PUFFS INTO THE LUNGS 2 (TWO) TIMES DAILY. 10.2 g 6   cetirizine (ZYRTEC) 10 MG tablet Take 1 tablet (10 mg  total) by mouth daily. 30 tablet 3   Continuous Blood Gluc Sensor (DEXCOM G6 SENSOR) MISC 1 Device by Does not apply route as directed. 9 each 3   Continuous Blood Gluc Transmit (DEXCOM G6 TRANSMITTER) MISC 1 Device by Does not apply route as directed. 1 each 3   Dexlansoprazole (DEXILANT) 30 MG capsule Take 1 capsule (30 mg total) by mouth daily. 30 capsule 11   dicyclomine (BENTYL) 20 MG tablet Take 1 tablet (20 mg total) by mouth 3 (three) times daily before meals. 90 tablet 11   empagliflozin (JARDIANCE) 25 MG TABS tablet Take 1 tablet (25 mg total) by mouth daily before breakfast. 90 tablet 3   ferrous sulfate 325 (65 FE) MG tablet Take 1 tablet (325 mg total) by mouth 3 (three) times daily with meals. 270 tablet 3   fluticasone (FLONASE) 50 MCG/ACT nasal spray Place 2 sprays into both nostrils daily. 16 g 6   furosemide (LASIX) 40 MG tablet TAKE 1 TABLET(40 MG) BY MOUTH TWICE DAILY (Patient taking differently: Take 40 mg by mouth 2 (two) times daily.) 180 tablet 1   Glucosamine Sulfate 1000 MG CAPS Take 1 capsule (1,000 mg total) by mouth 2 (two) times daily. (Patient taking differently: Take 1,000 mg by mouth 2 (two) times daily.) 180 capsule 3   glucose blood (ACCU-CHEK GUIDE) test strip USE AS DIRECTED UP TO FOUR TIMES DAILY 100 strip 4  Heating Pads (HEATING PAD MOIST/DRY KING SZ) PADS 1 each by Does not apply route 4 (four) times daily as needed. 120 each 1   hydrochlorothiazide (MICROZIDE) 12.5 MG capsule TAKE 1 CAPSULE(12.5 MG) BY MOUTH DAILY 30 capsule 3   HYDROcodone-acetaminophen (NORCO) 10-325 MG tablet Take 1 tablet by mouth 3 (three) times daily as needed. Do Not Fill Before 07/28/2021: 90 tablet 0   hydrocortisone 2.5 % cream Apply topically 2 (two) times daily. (Patient taking differently: Apply 1 application topically 2 (two) times daily.) 30 g 11   hydroquinone 4 % cream APPLY TOPICALLY TWICE DAILY (Patient taking differently: Apply 1 application topically 2 (two) times daily  as needed (pain).) 28.35 g 0   insulin glargine (LANTUS SOLOSTAR) 100 UNIT/ML Solostar Pen Inject 110 Units into the skin daily. 105 mL 3   insulin lispro (HUMALOG KWIKPEN) 200 UNIT/ML KwikPen Max daily 180 units 90 mL 3   Insulin Pen Needle 31G X 8 MM MISC 1 Device by Does not apply route in the morning, at noon, in the evening, and at bedtime. 400 each 3   isosorbide mononitrate (IMDUR) 30 MG 24 hr tablet Take 1 tablet (30 mg total) by mouth daily. 90 tablet 3   losartan (COZAAR) 100 MG tablet Take 1 tablet (100 mg total) by mouth daily. TAKE 1 TABLET(50MG TOTAL) BY MOUTH EVERY DAY 30 tablet 3   metoprolol succinate (TOPROL-XL) 100 MG 24 hr tablet Take 100 mg by mouth daily. Take with or immediately following a meal.     norethindrone (AYGESTIN) 5 MG tablet Take 3 tablets (15 mg total) by mouth 3 (three) times daily. With bleeding 270 tablet 0   ondansetron (ZOFRAN ODT) 4 MG disintegrating tablet Take 1 tablet (4 mg total) by mouth every 8 (eight) hours as needed for nausea or vomiting. 10 tablet 0   potassium chloride SA (KLOR-CON) 20 MEQ tablet TAKE 1 TABLET(20 MEQ) BY MOUTH DAILY (Patient taking differently: Take 20 mEq by mouth daily.) 30 tablet 6   rosuvastatin (CRESTOR) 20 MG tablet Take 1 tablet (20 mg total) by mouth daily. 30 tablet 3   saxagliptin HCl (ONGLYZA) 5 MG TABS tablet Take 1 tablet (5 mg total) by mouth daily. 90 tablet 3   senna-docusate (SENOKOT-S) 8.6-50 MG tablet Take 1 tablet by mouth at bedtime as needed for mild constipation. 30 tablet 0   spironolactone (ALDACTONE) 50 MG tablet TAKE 1 TABLET(50 MG) BY MOUTH DAILY (Patient taking differently: Take 50 mg by mouth daily.) 30 tablet 3   sucralfate (CARAFATE) 1 GM/10ML suspension Take 10 mLs (1 g total) by mouth 4 (four) times daily. 3600 mL 3   Tiotropium Bromide Monohydrate (SPIRIVA RESPIMAT) 2.5 MCG/ACT AERS Inhale 2 puffs into the lungs daily. 4 g 6   tiZANidine (ZANAFLEX) 4 MG tablet Take 1.5 tablets (6 mg total) by  mouth every 6 (six) hours as needed for muscle spasms. 120 tablet 3   TOBRADEX ophthalmic solution Place 1 drop into the right eye 4 (four) times daily.     vitamin E (VITAMIN E) 180 MG (400 UNITS) capsule Take 1 capsule (400 Units total) by mouth daily. 30 capsule 1   No current facility-administered medications on file prior to visit.    Cardiovascular and other pertinent studies:  Echocardiogram 06/01/2021:  Left ventricle cavity is normal in size. Mild concentric hypertrophy of  the left ventricle. Hyperdynamic LV systolic function >40%. Patient is  tachycardic throughout the day. Indeterminate diastolic filling pattern.  No significant  valvular abnormality.  Normal right atrial pressure.  Lexiscan/midified Bruce Tetrofosmin stress test 05/23/2021: Lexiscan/modified Bruce nuclear stress test performed using 1-day protocol. SPECT images show very small sized, mild intensity, inferior apical myocardium with mild reversibility. Stress LVEF calculated 40-45%, although visually appears 50-55%. Low risk study.  EKG 10/14/2020: Sinus tachycardia 111 bpm  Cannot exclude old anteroseptal infarct  EKG 09/01/2020: Sinus rhythm 85 bpm  Poor R wave progression Otherwise normal EKG  Vascular US 07/16/2020: No DVT  Echocardiogram 04/30/2020: 1. Left ventricular ejection fraction, by estimation, is 55 to 60%. The  left ventricle has normal function. The left ventricle has no regional  wall motion abnormalities. There is moderate concentric left ventricular  hypertrophy. Left ventricular  diastolic parameters are consistent with Grade I diastolic dysfunction  (impaired relaxation). Elevated left ventricular end-diastolic pressure.   2. Right ventricular systolic function is normal. The right ventricular  size is normal.   3. The mitral valve is normal in structure. Trivial mitral valve  regurgitation. No evidence of mitral stenosis.   4. The aortic valve is normal in structure. Aortic valve  regurgitation is  not visualized. No aortic stenosis is present.   5. The inferior vena cava is dilated in size with <50% respiratory  variability, suggesting right atrial pressure of 15 mmHg.    Recent labs: 07/29/2021: Glucose 158, BUN/Cr 10/0.99. EGFR 76. Na/K 144/3.9. AlKP 126. Rest of the CMP normal H/H 11.9/38. MCV 74. Platelets 271 HbA1C N/A Chol 125, TG 238, HDL 39, LDL 48 TSH 2.3 normal  06/14/2021: Chol 158, TG 171, HDL 26, LDL 92  12/26/2020: Glucose 243, BUN/Cr 10/1.17. EGFR 49. Na/K 134/4.6 Trop HS 5,4 H/H 9.8/32.3. MCV 74. Platelets 313 HbA1C 9.7%  07/26/2020: Glucose 160, BUN/Cr 10/1.13. EGFR 60. Na/K 140/4.0. Rest of the CMP normal H/H 9.5/34.7. MCV 70. Platelets 311 HbA1C 9.2% Chol 135, TG 166, HDL 29, LDL 77   Review of Systems  Cardiovascular: Positive for chest pain, dyspnea on exertion, leg swelling and orthopnea. Negative for palpitations and syncope.         Vitals:   08/15/21 1054  BP: (!) 154/73  Pulse: 91  Resp: 16  Temp: 98.4 F (36.9 C)  SpO2: 98%    Body mass index is 49.75 kg/m. Filed Weights   08/15/21 1054  Weight: 272 lb (123.4 kg)   Review of Systems  Cardiovascular:  Positive for chest pain and dyspnea on exertion. Negative for leg swelling, palpitations and syncope.  Psychiatric/Behavioral:         Tearful    Objective:   Physical Exam Physical Exam Vitals and nursing note reviewed.  Constitutional:      General: She is not in acute distress.    Appearance: She is obese.  Neck:     Vascular: No JVD.  Cardiovascular:     Rate and Rhythm: Normal rate and regular rhythm.     Heart sounds: Normal heart sounds. No murmur heard. Pulmonary:     Effort: Pulmonary effort is normal.     Breath sounds: Normal breath sounds. No wheezing or rales.  Musculoskeletal:     Right lower leg: No edema.     Left lower leg: No edema.          Assessment & Recommendations:   45 year old African-American female with  hypertension, type 2 diabetes mellitus, HFpEF, moderate persistent asthma, tobacco dependence, morbid obesity, microcytic anemia, uterine dysfunction  Exertional chest pain, dyspnea: Mild ischemia on stress testing, low risk study. Continue aspirin,  statin, metoprolol, Imdur.   Given persistence of symptoms on antianginal therapy and with recent worsening, recommend coronary angiography and possible intervention.  Hypertension: No clear syncope.  Orthostatics are negative.  Dizziness likely related to uncontrolled hypertension. Increase losartan to 100 mg daily. Check BMP in 1 week.  Chronic heart failure with preserved ejection fraction (HCC) Currently euvolumic.  Physical exam underwhelming to explain patient's 3 pillow orthopnea. Will obtain LVEDP measurement on upcoming coronary angiography. Continue current medical management, low salt diet  Controlled type 2 diabetes mellitus: Needs aggressive management. Defer to PCP.  I strongly encourage patient to call 911, should she have one of her slurred speech episodes.  I am not convinced that these are stroke events.  I encouraged her to talk to her PCP regarding neurology referral.  F/u after coronary angiography    Nigel Mormon, MD Pager: [phone] Office: [phone]

## 2021-08-30 NOTE — Interval H&P Note (Signed)
History and Physical Interval Note:  08/30/2021 10:49 AM  Jill Shaw  has presented today for surgery, with the diagnosis of CHEST PAIN.  The various methods of treatment have been discussed with the patient and family. After consideration of risks, benefits and other options for treatment, the patient has consented to  Procedure(s): LEFT HEART CATH AND CORONARY ANGIOGRAPHY (N/A) as a surgical intervention.  The patient's history has been reviewed, patient examined, no change in status, stable for surgery.  I have reviewed the patient's chart and labs.  Questions were answered to the patient's satisfaction.    2016/2017 Appropriate Use Criteria for Coronary Revascularization Symptom Status: Ischemic Symptoms  Non-invasive Testing: Low risk  If no or indeterminate stress test, FFR/iFR results in all diseased vessels: N/A  Diabetes Mellitus: Yes  S/P CABG: No  Antianginal therapy (number of long-acting drugs): >=2  Patient undergoing renal transplant: No  Patient undergoing percutaneous valve procedure: No  1 Vessel Disease PCI CABG  No proximal LAD involvement, No proximal left dominant LCX involvement A (7); Indication 1 M (5); Indication 1  Proximal left dominant LCX involvement A (7); Indication 4 A (7); Indication 4  Proximal LAD involvement A (7); Indication 4 A (7); Indication 4  2 Vessel Disease  No proximal LAD involvement A (7); Indication 7 M (6); Indication 7  Proximal LAD involvement A (7); Indication 13 A (8); Indication 13  3 Vessel Disease  Low disease complexity (e.g., focal stenoses, SYNTAX <=22) A (7); Indication 18 A (8); Indication 18  Intermediate or high disease complexity (e.g., SYNTAX >=23) M (6); Indication 22 A (9); Indication 22  Left Main Disease  Isolated LMCA disease: ostial or midshaft A (7); Indication 24 A (9); Indication 24  Isolated LMCA disease: bifurcation involvement M (6); Indication 25 A (9); Indication 25  LMCA ostial or midshaft, concurrent  low disease burden multivessel disease (e.g., 1-2 additional focal stenoses, SYNTAX <=22) A (7); Indication 26 A (9); Indication 26  LMCA ostial or midshaft, concurrent intermediate or high disease burden multivessel disease (e.g., 1-2 additional bifurcation stenoses, long stenoses, SYNTAX >=23) M (4); Indication 27 A (9); Indication 27  LMCA bifurcation involvement, concurrent low disease burden multivessel disease (e.g., 1-2 additional focal stenoses, SYNTAX <=22) M (6); Indication 28 A (9); Indication 28  LMCA bifurcation involvement, concurrent intermediate or high disease burden multivessel disease (e.g., 1-2 additional bifurcation stenoses, long stenoses, SYNTAX >=23) R (3); Indication 29 A (9); Indication 29     Rafael Quesada J Taner Rzepka

## 2021-08-30 NOTE — Discharge Instructions (Signed)
Radial Site Care  This sheet gives you information about how to care for yourself after your procedure. Your health care provider may also give you more specific instructions. If you have problems or questions, contact your health care provider. What can I expect after the procedure? After the procedure, it is common to have: Bruising and tenderness at the catheter insertion area. Follow these instructions at home: Medicines Take over-the-counter and prescription medicines only as told by your health care provider. Insertion site care Follow instructions from your health care provider about how to take care of your insertion site. Make sure you: Wash your hands with soap and water before you remove your bandage (dressing). If soap and water are not available, use hand sanitizer. May remove dressing in 24 hours. Check your insertion site every day for signs of infection. Check for: Redness, swelling, or pain. Fluid or blood. Pus or a bad smell. Warmth. Do no take baths, swim, or use a hot tub for 5 days. You may shower 24-48 hours after the procedure. Remove the dressing and gently wash the site with plain soap and water. Pat the area dry with a clean towel. Do not rub the site. That could cause bleeding. Do not apply powder or lotion to the site. Activity  For 24 hours after the procedure, or as directed by your health care provider: Do not flex or bend the affected arm. Do not push or pull heavy objects with the affected arm. Do not drive yourself home from the hospital or clinic. You may drive 24 hours after the procedure. Do not operate machinery or power tools. KEEP ARM ELEVATED THE REMAINDER OF THE DAY. Do not push, pull or lift anything that is heavier than 10 lb for 5 days. Ask your health care provider when it is okay to: Return to work or school. Resume usual physical activities or sports. Resume sexual activity. General instructions If the catheter site starts to  bleed, raise your arm and put firm pressure on the site. If the bleeding does not stop, get help right away. This is a medical emergency. DRINK PLENTY OF FLUIDS FOR THE NEXT 2-3 DAYS. No alcohol consumption for 24 hours after receiving sedation. If you went home on the same day as your procedure, a responsible adult should be with you for the first 24 hours after you arrive home. Keep all follow-up visits as told by your health care provider. This is important. Contact a health care provider if: You have a fever. You have redness, swelling, or yellow drainage around your insertion site. Get help right away if: You have unusual pain at the radial site. The catheter insertion area swells very fast. The insertion area is bleeding, and the bleeding does not stop when you hold steady pressure on the area. Your arm or hand becomes pale, cool, tingly, or numb. These symptoms may represent a serious problem that is an emergency. Do not wait to see if the symptoms will go away. Get medical help right away. Call your local emergency services (911 in the [place] Do not drive yourself to the hospital. Summary After the procedure, it is common to have bruising and tenderness at the site. Follow instructions from your health care provider about how to take care of your radial site wound. Check the wound every day for signs of infection.  This information is not intended to replace advice given to you by your health care provider. Make sure you discuss any questions you have with   your health care provider. Document Revised: 11/07/2017 Document Reviewed: 11/07/2017 Elsevier Patient Education  2020 Elsevier Inc.  

## 2021-08-31 ENCOUNTER — Ambulatory Visit: Payer: Medicaid Other | Admitting: Pulmonary Disease

## 2021-09-01 MED FILL — Lidocaine HCl Local Preservative Free (PF) Inj 1%: INTRAMUSCULAR | Qty: 30 | Status: AC

## 2021-09-01 MED FILL — Verapamil HCl IV Soln 2.5 MG/ML: INTRAVENOUS | Qty: 2 | Status: AC

## 2021-09-02 ENCOUNTER — Ambulatory Visit (HOSPITAL_BASED_OUTPATIENT_CLINIC_OR_DEPARTMENT_OTHER): Payer: Medicaid Other | Attending: Pulmonary Disease | Admitting: Pulmonary Disease

## 2021-09-02 ENCOUNTER — Ambulatory Visit: Payer: Medicaid Other | Admitting: Physician Assistant

## 2021-09-05 NOTE — Telephone Encounter (Signed)
No response from pt in regards to Pulmonary Rehab. Closed referral    

## 2021-09-06 ENCOUNTER — Telehealth: Payer: Self-pay

## 2021-09-06 NOTE — Telephone Encounter (Signed)
Pt was informed.

## 2021-09-06 NOTE — Telephone Encounter (Signed)
Blood pressure is very uncontrolled. I am not in the office. Consider going to urgent care or ER for hypertensive urgency. Good news is that she does not have blockages in her heart arteries.  Thanks MJP

## 2021-09-12 ENCOUNTER — Telehealth: Payer: Self-pay | Admitting: General Practice

## 2021-09-12 NOTE — Telephone Encounter (Signed)
Received fax request from [facility] stating norethindrone requires a PA. Per chart review, patient takes medication for AUB- unsure if she takes daily or only PRN for bleeding.  Called patient, no answer- left message to call us back as we have questions regarding a medication she is taking.

## 2021-09-12 NOTE — Telephone Encounter (Signed)
Patient returned call to office and informed me she takes the aygestin about 5 days a week 8-9 pills a day, it just depends on how her bleeding is doing. Discussed with patient her previous PA for the medication has expired and a new one must be submitted. Told patient I would work on that today and give her a call back.  Called for PA with patient's insurance at [phone] and submitted PA. I was given PA #: [id number] and was told it would go to clinical review and take approximately 24 hours for a response. Called patient, no answer- left message stating the prior authorization has been submitted and will take up to 24 hours for a response from your insurance, you may call us back if you have other questions.

## 2021-09-13 ENCOUNTER — Encounter (HOSPITAL_BASED_OUTPATIENT_CLINIC_OR_DEPARTMENT_OTHER): Payer: Medicaid Other | Admitting: Physical Medicine and Rehabilitation

## 2021-09-13 ENCOUNTER — Other Ambulatory Visit: Payer: Self-pay

## 2021-09-13 ENCOUNTER — Telehealth: Payer: Self-pay | Admitting: Physical Medicine and Rehabilitation

## 2021-09-13 DIAGNOSIS — M25561 Pain in right knee: Secondary | ICD-10-CM

## 2021-09-13 DIAGNOSIS — M792 Neuralgia and neuritis, unspecified: Secondary | ICD-10-CM | POA: Diagnosis not present

## 2021-09-13 DIAGNOSIS — M25511 Pain in right shoulder: Secondary | ICD-10-CM

## 2021-09-13 DIAGNOSIS — M170 Bilateral primary osteoarthritis of knee: Secondary | ICD-10-CM

## 2021-09-13 DIAGNOSIS — M5416 Radiculopathy, lumbar region: Secondary | ICD-10-CM | POA: Diagnosis not present

## 2021-09-13 NOTE — Telephone Encounter (Signed)
Patient would like to speak with Dr. Carlis Abbott in reference to pain and her working.  Please call patient and advise.  Thank you.

## 2021-09-14 NOTE — Progress Notes (Signed)
Subjective:    Patient ID: Jill Shaw, female    DOB: 05-Mar-1976, 45 y.o.   MRN: [id number]   HPI: An audio/video tele-health visit is felt to be the most appropriate encounter for this patient at this time.  This is a follow up tele-visit via phone. The patient is at home. MD is at office.     Jill Shaw is a 45 y.o. female who returns for f/u appointment for chronic pain, diabetic peripheral neuropathy, and insomnia.   1) Insomnia: -She continues to experience insomnia at night. Asks about Ambien and I discussed that this is an addictive medication and there are other safer options we can try first that can also help with her pain.  -She is currently not taking either of these medications, or Amitriptyline or Trazodone.  -She takes Requip for resltless legs but this does not help. -She is still sleeping very poorly -She does use screens before bed time  2) Back pain secondary to lumbar radiculitis.  -She states her pain is located in her lower back radiating into her bilateral lower extremities and bilateral knee pain. She denies falling. She rates her pain 9. Her current exercise regime is walking.  -[name] equivalent is 20.00 MME.  -She was hospitalized for serotonin syndrome after use of Savella and Cymbalta.  -Back pain has been severe -She requests a renewal of her handicap placard as she is unable to walk 200 feet without stopping to rest.  -Her pain has been better controlled with Norco -she would like to get another XR given the chronicity of her pain.   3) Diabetic peripheral neuropathy -She tolerated Qutenza well.  -Her peripheral neuropathy is worse in her bilateral lower extremities, especially the dorsum and soles of both feet, including the toes.   4) Right shoulder and arm pain -notes limited range of motion in her shoulder -does have pain radiating into right arm from neck and arm feels heavy at times   Pain Inventory Average Pain 9 Pain  Right Now 9 My pain is sharp, burning, tingling and aching  In the last 24 hours, has pain interfered with the following? General activity 0 Relation with others 0 Enjoyment of life 5 What TIME of day is your pain at its worst? morning , daytime, evening and night Sleep (in general) Poor  Pain is worse with: walking, bending, sitting, inactivity, standing and some activites Pain improves with: heat/ice and medication Relief from Meds: 10  Family History  Problem Relation Age of Onset   Diabetes Mother    Hypertension Mother    Social History   Socioeconomic History   Marital status: Legally Separated    Spouse name: Not on file   Number of children: 3   Years of education: Not on file   Highest education level: Not on file  Occupational History   Occupation: unemployed  Tobacco Use   Smoking status: Some Days    Packs/day: 0.25    Years: 26.00    Pack years: 6.50    Types: Cigarettes    Last attempt to quit: 09/13/2020    Years since quitting: 1.0   Smokeless tobacco: Never   Tobacco comments:    4-5 cigarettes/day  Vaping Use   Vaping Use: Never used  Substance and Sexual Activity   Alcohol use: No   Drug use: No   Sexual activity: Not Currently    Birth control/protection: None  Other Topics Concern   Not on file  Social History Narrative   Right Handed   Lives in a one story apartment, but lives on the second floor   Drinks caffeine once in awhile   Social Determinants of Health   Financial Resource Strain: Not on file  Food Insecurity: No Food Insecurity   Worried About Programme researcher, broadcasting/film/video in the Last Year: Never true   Barista in the Last Year: Never true  Transportation Needs: No Transportation Needs   Lack of Transportation (Medical): No   Lack of Transportation (Non-Medical): No  Physical Activity: Not on file  Stress: Not on file  Social Connections: Not on file   Past Surgical History:  Procedure Laterality Date   CESAREAN SECTION      x 1. for twins   DILATION AND CURETTAGE OF UTERUS N/A 08/20/2019   Procedure: DILATATION AND CURETTAGE;  Surgeon: Allie Bossier, MD;  Location: [facility] OR;  Service: Gynecology;  Laterality: N/A;   ENDOMETRIAL ABLATION N/A 08/20/2019   Procedure: Minerva Ablation;  Surgeon: Allie Bossier, MD;  Location: [facility] OR;  Service: Gynecology;  Laterality: N/A;   EYE SURGERY Bilateral    laser right and cataract removed left eye   LEFT HEART CATH AND CORONARY ANGIOGRAPHY N/A 08/30/2021   Procedure: LEFT HEART CATH AND CORONARY ANGIOGRAPHY;  Surgeon: Elder Negus, MD;  Location: [facility] INVASIVE [facility];  Service: Cardiovascular;  Laterality: N/A;   RADIOLOGY WITH ANESTHESIA N/A 09/16/2019   Procedure: MRI WITH ANESTHESIA   L SPINE WITHOUT CONTRAST, T SPINE WITHOUT CONTRAST , CERVICAL WITHOUT CONTRAST;  Surgeon: Radiologist, Medication, MD;  Location: [facility] OR;  Service: Radiology;  Laterality: N/A;   TUBAL LIGATION     interval BTL   UPPER GI ENDOSCOPY  07/2017   Past Surgical History:  Procedure Laterality Date   CESAREAN SECTION     x 1. for twins   DILATION AND CURETTAGE OF UTERUS N/A 08/20/2019   Procedure: DILATATION AND CURETTAGE;  Surgeon: Allie Bossier, MD;  Location: [facility] OR;  Service: Gynecology;  Laterality: N/A;   ENDOMETRIAL ABLATION N/A 08/20/2019   Procedure: Minerva Ablation;  Surgeon: Allie Bossier, MD;  Location: [facility];  Service: Gynecology;  Laterality: N/A;   EYE SURGERY Bilateral    laser right and cataract removed left eye   LEFT HEART CATH AND CORONARY ANGIOGRAPHY N/A 08/30/2021   Procedure: LEFT HEART CATH AND CORONARY ANGIOGRAPHY;  Surgeon: Elder Negus, MD;  Location: [facility];  Service: Cardiovascular;  Laterality: N/A;   RADIOLOGY WITH ANESTHESIA N/A 09/16/2019   Procedure: MRI WITH ANESTHESIA   L SPINE WITHOUT CONTRAST, T SPINE WITHOUT CONTRAST , CERVICAL WITHOUT CONTRAST;  Surgeon: Radiologist, Medication, MD;  Location: [facility] OR;  Service: Radiology;  Laterality: N/A;    TUBAL LIGATION     interval BTL   UPPER GI ENDOSCOPY  07/2017   Past Medical History:  Diagnosis Date   Anemia    Arthritis    knees, hands   Asthma    Chronic diastolic (congestive) heart failure (HCC)    COPD (chronic obstructive pulmonary disease) (HCC)    Diabetes mellitus without complication (HCC)    type 2   DKA (diabetic ketoacidosis) (HCC) 10/15/2020   Dysfunctional uterine bleeding    GERD (gastroesophageal reflux disease)    Hypertension    Neuromuscular disorder (HCC)    neuropathy feet   Seizures (HCC) 09/12/2017   pt states r/t stress and blood sugar - no meds last one  4 months ago, not seen neurologist   Sickle cell trait (HCC)    Smoker    Vitamin D deficiency 10/2019   Wears glasses    There were no vitals taken for this visit.  Opioid Risk Score:   Fall Risk Score:  `1  Depression screen PHQ 2/9  Depression screen [facility] 2/9 08/01/2021 07/29/2021 06/14/2021 04/15/2021 03/30/2021 03/22/2021 01/31/2021  Decreased Interest 1 2 0 2 0 1 1  Down, Depressed, Hopeless 1 2 0 2 0 1 1  PHQ - 2 Score 2 4 0 4 0 2 2  Altered sleeping - 3 - 3 - - -  Tired, decreased energy - 2 - 3 - - -  Change in appetite - 0 - 0 - - -  Feeling bad or failure about yourself  - - - 0 - - -  Trouble concentrating - 2 - 2 - - -  Moving slowly or fidgety/restless - 0 - 0 - - -  Suicidal thoughts - 0 - 0 - - -  PHQ-9 Score - 11 - 12 - - -  Difficult doing work/chores - - - - - - -  Some recent data might be hidden     Review of Systems  Constitutional:  Positive for diaphoresis and unexpected weight change.  Respiratory:  Positive for shortness of breath and wheezing.   Gastrointestinal:  Positive for abdominal pain, constipation and nausea.  Musculoskeletal:  Positive for arthralgias, back pain, gait problem and myalgias.  Neurological:  Positive for dizziness, weakness and numbness.  Hematological:  Bruises/bleeds easily.  Psychiatric/Behavioral:  Positive for confusion and  decreased concentration. The patient is nervous/anxious.   All other systems reviewed and are negative.     Objective:  Not performed as patient was seen via phone.      Assessment & Plan:  1. Lumbar Radiculitis: Continue current medication regimen. Continue HEP as Tolerated. Provided with handicap placard. XR ordered. Note for work provided via Clinical cytogeneticist.  2. Fibromyalgia: Continue HEP as Tolerated. Continue current Medication regimen. Continue to Monitor.  3. Bilateral Knee Pain: Right knee is currently worst, XR ordered and will call with results.  Continue to Monitor.  4. Chronic Pain Syndrome: Refilled Hydrocodone 10/325mg  one tablet twice a day as needed for pain #60. We will continue the opioid monitoring program, this consists of regular clinic visits, examinations, urine drug screen, pill counts as well as use of [place] Controlled Substance Reporting system. A 12 month History has been reviewed on the [place] Controlled Substance Reporting System on 02/23/2021. 5. Insomnia: -Try to go outside near sunrise -Get exercise during the day.  -Discussed good sleep hygiene: turning off all devices an hour before bedtime.  -Chamomile tea with dinner.  -Melatonin did for help. -warm bath or shower before bed. -apply lavender oil for forehead at night 6. Diabetic peripheral neuropathy -Discussed Qutenza as an option for neuropathic pain control. Discussed that this is a capsaicin patch, stronger than capsaicin cream. Discussed that it is currently approved for diabetic peripheral neuropathy and post-herpetic neuralgia, but that it has also shown benefit in treating other forms of neuropathy. Provided patient with link to site to learn more about the patch: https://www.clark.biz/. Discussed that the patch would be placed in office and benefits usually last 3 months. Discussed that unintended exposure to capsaicin can cause severe irritation of eyes, mucous membranes, respiratory  tract, and skin, but that Qutenza is a local treatment and does not have the systemic side effects  of other nerve medications. Discussed that there may be pain, itching, erythema, and decreased sensory function associated with the application of Qutenza. Side effects usually subside within 1 week. A cold pack of analgesic medications can help with these side effects. Blood pressure can also be increased due to pain associated with administration of the patch.  4 patches of Qutenza was applied to the area of pain. Ice packs were applied during the procedure to ensure patient comfort. Blood pressure was monitored every 15 minutes. The patient tolerated the procedure well. Post-procedure instructions were given and follow-up has been scheduled.    7. Right sided shoulder and arm pain -discussed that could be a component or frozen shoulder and/or cervical radiculitis -will obtain right shoulder XR and cervical spine XR and call when results are available  >10 minutes spent in discussion of patient's diffuse musculoskeletal pains, ordering imaging of her right knee, shoulder, neck, and lower back, and writing note for her work.

## 2021-09-15 ENCOUNTER — Encounter: Payer: Self-pay | Admitting: Physical Medicine and Rehabilitation

## 2021-09-15 ENCOUNTER — Other Ambulatory Visit: Payer: Self-pay | Admitting: Nurse Practitioner

## 2021-09-15 MED ORDER — HYDROXYZINE HCL 25 MG PO TABS: 25.0000 mg | ORAL_TABLET | Freq: Three times a day (TID) | ORAL | 0 refills | Status: DC | PRN

## 2021-09-15 NOTE — Progress Notes (Signed)
   [facility] [address] [place], [place]  [address] Phone:  [phone]   Fax:  [phone] 

## 2021-09-16 ENCOUNTER — Other Ambulatory Visit: Payer: Self-pay | Admitting: Obstetrics and Gynecology

## 2021-09-16 DIAGNOSIS — N939 Abnormal uterine and vaginal bleeding, unspecified: Secondary | ICD-10-CM

## 2021-09-21 ENCOUNTER — Other Ambulatory Visit: Payer: Self-pay

## 2021-09-21 ENCOUNTER — Ambulatory Visit (INDEPENDENT_AMBULATORY_CARE_PROVIDER_SITE_OTHER): Payer: Medicaid Other

## 2021-09-21 ENCOUNTER — Encounter: Payer: Self-pay | Admitting: Physician Assistant

## 2021-09-21 ENCOUNTER — Ambulatory Visit (INDEPENDENT_AMBULATORY_CARE_PROVIDER_SITE_OTHER): Payer: Medicaid Other | Admitting: Physician Assistant

## 2021-09-21 ENCOUNTER — Encounter: Payer: Self-pay | Admitting: Obstetrics and Gynecology

## 2021-09-21 ENCOUNTER — Other Ambulatory Visit (INDEPENDENT_AMBULATORY_CARE_PROVIDER_SITE_OTHER): Payer: Medicaid Other

## 2021-09-21 VITALS — BP 150/81 | HR 103 | Ht 64.0 in | Wt 268.1 lb

## 2021-09-21 VITALS — BP 142/78 | HR 110 | Ht 64.0 in | Wt 267.2 lb

## 2021-09-21 DIAGNOSIS — R1310 Dysphagia, unspecified: Secondary | ICD-10-CM

## 2021-09-21 DIAGNOSIS — R112 Nausea with vomiting, unspecified: Secondary | ICD-10-CM | POA: Diagnosis not present

## 2021-09-21 DIAGNOSIS — Z3042 Encounter for surveillance of injectable contraceptive: Secondary | ICD-10-CM

## 2021-09-21 DIAGNOSIS — R109 Unspecified abdominal pain: Secondary | ICD-10-CM

## 2021-09-21 LAB — CBC WITH DIFFERENTIAL/PLATELET
Basophils Absolute: 0.1 10*3/uL (ref 0.0–0.1)
Basophils Relative: 0.8 % (ref 0.0–3.0)
Eosinophils Absolute: 0.4 10*3/uL (ref 0.0–0.7)
Eosinophils Relative: 2.8 % (ref 0.0–5.0)
HCT: 40.3 % (ref 36.0–46.0)
Hemoglobin: 12.9 g/dL (ref 12.0–15.0)
Lymphocytes Relative: 22.8 % (ref 12.0–46.0)
Lymphs Abs: 3 10*3/uL (ref 0.7–4.0)
MCHC: 32 g/dL (ref 30.0–36.0)
MCV: 75.1 fl — ABNORMAL LOW (ref 78.0–100.0)
Monocytes Absolute: 0.6 10*3/uL (ref 0.1–1.0)
Monocytes Relative: 4.6 % (ref 3.0–12.0)
Neutro Abs: 9 10*3/uL — ABNORMAL HIGH (ref 1.4–7.7)
Neutrophils Relative %: 69 % (ref 43.0–77.0)
Platelets: 274 10*3/uL (ref 150.0–400.0)
RBC: 5.36 Mil/uL — ABNORMAL HIGH (ref 3.87–5.11)
RDW: 23 % — ABNORMAL HIGH (ref 11.5–15.5)
WBC: 13.1 10*3/uL — ABNORMAL HIGH (ref 4.0–10.5)

## 2021-09-21 LAB — BASIC METABOLIC PANEL
BUN: 13 mg/dL (ref 6–23)
CO2: 28 mEq/L (ref 19–32)
Calcium: 10 mg/dL (ref 8.4–10.5)
Chloride: 102 mEq/L (ref 96–112)
Creatinine, Ser: 1.1 mg/dL (ref 0.40–1.20)
GFR: 60.99 mL/min (ref >60.00)
Glucose, Bld: 118 mg/dL — ABNORMAL HIGH (ref 70–99)
Potassium: 3.4 mEq/L — ABNORMAL LOW (ref 3.5–5.1)
Sodium: 140 mEq/L (ref 135–145)

## 2021-09-21 MED ORDER — MEDROXYPROGESTERONE ACETATE 150 MG/ML IM SUSP
150.0000 mg | Freq: Once | INTRAMUSCULAR | Status: AC
  Administered 2021-09-21: 150 mg via INTRAMUSCULAR

## 2021-09-21 NOTE — Patient Instructions (Signed)
If you are age 44 or younger, your body mass index should be between 19-25. Your Body mass index is 45.87 kg/m. If this is out of the aformentioned range listed, please consider follow up with your Primary Care Provider.   ________________________________________________________  The [facility] providers would like to encourage you to use [facility] to communicate with providers for non-urgent requests or questions.  Due to long hold times on the telephone, sending your provider a message by [facility] may be a faster and more efficient way to get a response.  Please allow 48 business hours for a response.  Please remember that this is for non-urgent requests.  _______________________________________________________ Your provider has requested that you go to the basement level for lab work before leaving today. Press "B" on the elevator. The lab is located at the first door on the left as you exit the elevator.  You have been scheduled for an endoscopy. Please follow written instructions given to you at your visit today. If you use inhalers (even only as needed), please bring them with you on the day of your procedure.  You have been scheduled for a gastric emptying scan at [facility] on 12/147/2022 at 9:30 am. Please arrive at least 30 minutes prior to your appointment for registration. Please make certain not to have anything to eat or drink after midnight the night before your test. Hold all stomach medications (ex: Zofran, phenergan, Reglan) 48 hours prior to your test. If you need to reschedule your appointment, please contact radiology scheduling at [phone]. _____________________________________________________________________ A gastric-emptying study measures how long it takes for food to move through your stomach. There are several ways to measure stomach emptying. In the most common test, you eat food that contains a small amount of radioactive material. A scanner that detects the  movement of the radioactive material is placed over your abdomen to monitor the rate at which food leaves your stomach. This test normally takes about 4 hours to complete. ____________________________________________________________  Continue Dexilant 30 mg 1 capsule every morning  Stop Famotidine and Dicyclomine  You can continue Sucralfate 1 tablet twice daily between meals.  Follow up pending the resulst of your imaging and Endoscopy.  Thank you for entrusting me with your care and choosing [facility].  Amy Esterwood, PA-C

## 2021-09-21 NOTE — Progress Notes (Signed)
Jill Shaw here for Depo-Provera Injection. Injection administered without complication. Patient will return in 3 months for next injection between Feb 22 and March 8,2023. Next annual visit due Oct 2023.   Pt has elevated BP today at visit. Pt did take BP meds today. Pt encouraged and advised to follow up with PCP for follow up. Pt verbalized understanding and agreeable to plan of care.   [name], RN 09/21/2021  10:43 AM

## 2021-09-21 NOTE — Progress Notes (Signed)
Subjective:    Patient ID: Jill Shaw, female    DOB: [birth year], 45 y.o.   MRN: [id number]  HPI Jill Shaw is a pleasant 45 year old African-American female, new to GI today self-referred with complaints of chronic abdominal pain and dysphagia.  Patient has history of hypertension, congestive heart failure, COPD, adult onset diabetes mellitus, insulin-dependent over the past 25 years, prior history of EtOH use, sickle cell trait. She was seen by [facility] for 1 visit in September 2022.  She wanted to be seen closer to home so decided to establish care.  She did not undergo any work-up after that visit.  She has not had any prior EGD or colonoscopy. She says her current symptoms have been present over the past 3 to 4 years.  She describes "bad" abdominal pain for which she is using hydrocodone on a regular basis and says frequently even this does not help.  She describes intermittent episodes of pain associated with nausea and vomiting.  She does have periods of time where she feels okay and may go for couple of weeks without symptoms.  She describes her pain as burning and stabbing in nature and present all across the upper abdomen.  Frequently associated with loose stools.  No melena or hematochezia. She has also had issues with primarily solid food dysphagia over the past couple of years having an episode at least a few times per week.  She says she can usually get the food to go on down with drinking water and is not having any episodes requiring regurgitation.  She is on Dexilant for chronic GERD and also currently is taking Pepcid 20 mg twice daily as well as dicyclomine twice daily and sucralfate twice a day.  These have all been started within the past months. Most recent labs 08/21/2021 potassium 2.7/creatinine 1.16, LFTs within normal limits, lipase within normal limits, dub BBC 13,000/hemoglobin 11.8/hematocrit 36.2/MCV 71 CT of the abdomen pelvis with contrast had been done on  08/19/2021 with ER visit and pertinent for hepatic steatosis, tiny layering gallstones.  Patient has noticed minimal if any relief on Dexilant, famotidine, Bentyl and Carafate. No current NSAID use, she does take a baby aspirin, no current EtOH use, no cannabis use. She has status post C-section, no other abdominal surgery.    Review of Systems.Pertinent positive and negative review of systems were noted in the above HPI section.  All other review of systems was otherwise negative.   Outpatient Encounter Medications as of 09/21/2021  Medication Sig   Accu-Chek Softclix Lancets lancets USE AS DIRECTED UP TO FOUR TIMES DAILY   albuterol (PROVENTIL) (2.5 MG/3ML) 0.083% nebulizer solution Take 3 mLs (2.5 mg total) by nebulization every 6 (six) hours as needed for wheezing or shortness of breath.   albuterol (VENTOLIN HFA) 108 (90 Base) MCG/ACT inhaler Inhale 2 puffs into the lungs every 6 (six) hours as needed for wheezing or shortness of breath.   aspirin EC 81 MG tablet Take 1 tablet (81 mg total) by mouth daily.   blood glucose meter kit and supplies KIT 1 each by Other route See admin instructions. Dispense based on patient and insurance preference. Use up to four times daily as directed. (FOR ICD-9 250.00, 250.01).   Blood Pressure Monitoring (ADULT BLOOD PRESSURE CUFF LG) KIT 1 kit by Does not apply route daily.   budesonide-formoterol (SYMBICORT) 160-4.5 MCG/ACT inhaler INHALE 2 PUFFS INTO THE LUNGS 2 (TWO) TIMES DAILY.   cetirizine (ZYRTEC) 10 MG tablet Take 1 tablet (  10 mg total) by mouth daily.   Continuous Blood Gluc Sensor (DEXCOM G6 SENSOR) MISC 1 Device by Does not apply route as directed.   Continuous Blood Gluc Transmit (DEXCOM G6 TRANSMITTER) MISC 1 Device by Does not apply route as directed.   Dexlansoprazole (DEXILANT) 30 MG capsule Take 1 capsule (30 mg total) by mouth daily.   empagliflozin (JARDIANCE) 25 MG TABS tablet Take 1 tablet (25 mg total) by mouth daily before  breakfast.   ferrous sulfate 325 (65 FE) MG tablet Take 1 tablet (325 mg total) by mouth 3 (three) times daily with meals.   fluticasone (FLONASE) 50 MCG/ACT nasal spray Place 2 sprays into both nostrils daily.   furosemide (LASIX) 40 MG tablet TAKE 1 TABLET($RemoveBefor'40MG'NEWoTacncQrj$  TOTAL) BY MOUTH TWICE DAILY   Glucosamine Sulfate 1000 MG CAPS Take 1 capsule (1,000 mg total) by mouth 2 (two) times daily. (Patient taking differently: Take 1,000 mg by mouth 2 (two) times daily.)   glucose blood (ACCU-CHEK GUIDE) test strip USE AS DIRECTED UP TO FOUR TIMES DAILY   haloperidol (HALDOL) 5 MG tablet Take 0.5-1 tablets (2.5-5 mg total) by mouth every 8 (eight) hours as needed (For nausea/vomiting).   Heating Pads (HEATING PAD MOIST/DRY KING SZ) PADS 1 each by Does not apply route 4 (four) times daily as needed.   hydrochlorothiazide (MICROZIDE) 12.5 MG capsule TAKE 1 CAPSULE(12.5 MG) BY MOUTH DAILY   HYDROcodone-acetaminophen (NORCO) 10-325 MG tablet Take 1 tablet by mouth 3 (three) times daily as needed. Do Not Fill Before 07/28/2021:   hydrocortisone 2.5 % cream Apply topically 2 (two) times daily. (Patient taking differently: Apply 1 application topically 2 (two) times daily.)   hydroquinone 4 % cream APPLY TOPICALLY TWICE DAILY (Patient taking differently: Apply 1 application topically 2 (two) times daily as needed (pain).)   hydrOXYzine (ATARAX) 25 MG tablet Take 1 tablet (25 mg total) by mouth every 8 (eight) hours as needed. Anxiety   insulin glargine (LANTUS SOLOSTAR) 100 UNIT/ML Solostar Pen Inject 110 Units into the skin daily.   insulin lispro (HUMALOG KWIKPEN) 200 UNIT/ML KwikPen Max daily 180 units   Insulin Pen Needle 31G X 8 MM MISC 1 Device by Does not apply route in the morning, at noon, in the evening, and at bedtime.   isosorbide mononitrate (IMDUR) 30 MG 24 hr tablet Take 1 tablet (30 mg total) by mouth daily.   losartan (COZAAR) 100 MG tablet Take 1 tablet (100 mg total) by mouth daily. TAKE 1  TABLET($RemoveB'50MG'PFZYUvES$  TOTAL) BY MOUTH EVERY DAY   metoprolol succinate (TOPROL-XL) 100 MG 24 hr tablet Take 100 mg by mouth daily. Take with or immediately following a meal.   ondansetron (ZOFRAN ODT) 4 MG disintegrating tablet Take 1 tablet (4 mg total) by mouth every 8 (eight) hours as needed for nausea or vomiting.   potassium chloride SA (KLOR-CON) 20 MEQ tablet TAKE 1 TABLET(20 MEQ) BY MOUTH DAILY (Patient taking differently: Take 20 mEq by mouth daily.)   promethazine (PHENERGAN) 25 MG tablet Take 1 tablet (25 mg total) by mouth every 6 (six) hours as needed for nausea or vomiting.   rosuvastatin (CRESTOR) 20 MG tablet Take 1 tablet (20 mg total) by mouth daily.   saxagliptin HCl (ONGLYZA) 5 MG TABS tablet Take 1 tablet (5 mg total) by mouth daily.   senna-docusate (SENOKOT-S) 8.6-50 MG tablet Take 1 tablet by mouth at bedtime as needed for mild constipation.   spironolactone (ALDACTONE) 50 MG tablet TAKE 1 TABLET(50 MG) BY MOUTH  DAILY (Patient taking differently: Take 50 mg by mouth daily.)   sucralfate (CARAFATE) 1 g tablet Take 1 tablet (1 g total) by mouth 4 (four) times daily -  with meals and at bedtime.   Tiotropium Bromide Monohydrate (SPIRIVA RESPIMAT) 2.5 MCG/ACT AERS Inhale 2 puffs into the lungs daily.   tiZANidine (ZANAFLEX) 4 MG tablet Take 1.5 tablets (6 mg total) by mouth every 6 (six) hours as needed for muscle spasms.   TOBRADEX ophthalmic solution Place 1 drop into the right eye 4 (four) times daily.   Vitamin D, Ergocalciferol, (DRISDOL) 1.25 MG (50000 UNIT) CAPS capsule Take 1 capsule (50,000 Units total) by mouth every Sunday for 8 doses.   vitamin E (VITAMIN E) 180 MG (400 UNITS) capsule Take 1 capsule (400 Units total) by mouth daily.   [DISCONTINUED] dicyclomine (BENTYL) 20 MG tablet Take 1 tablet (20 mg total) by mouth 2 (two) times daily.   [DISCONTINUED] famotidine (PEPCID) 20 MG tablet Take 1 tablet (20 mg total) by mouth 2 (two) times daily.   norethindrone (AYGESTIN) 5 MG  tablet Take 3 tablets (15 mg total) by mouth 3 (three) times daily. With bleeding   No facility-administered encounter medications on file as of 09/21/2021.   Allergies  Allergen Reactions   Amitriptyline Other (See Comments)    Coma   Ketoprofen Nausea And Vomiting   Trazodone Nausea And Vomiting   Aspirin Nausea Only   Gabapentin Nausea And Vomiting and Other (See Comments)    upset stomach   Ibuprofen Nausea And Vomiting   Liraglutide Nausea And Vomiting   Naproxen Nausea And Vomiting   Omeprazole-Sodium Bicarbonate Nausea And Vomiting   Sulfa Antibiotics Nausea And Vomiting   Tramadol Nausea And Vomiting and Other (See Comments)    stomach upset   Patient Active Problem List   Diagnosis Date Noted   Mixed hyperlipidemia 08/15/2021   Abnormal stress test 08/15/2021   DM (diabetes mellitus), type 2, uncontrolled, with renal complications 30/04/6225   Intractable nausea and vomiting 03/23/2021   Serotonin syndrome 03/14/2021   Overdose, undetermined intent, initial encounter 03/14/2021   Dyslipidemia 02/16/2021   Insulin resistance 02/16/2021   Type 2 diabetes mellitus without complication, with long-term current use of insulin ([place]) 02/16/2021   Amitriptyline overdose, accidental or unintentional, initial encounter 01/09/2021   Ingestion of substance, undetermined intent, initial encounter 01/08/2021   Hyperglycemia due to type 2 diabetes mellitus ([place]) 01/08/2021   SIRS (systemic inflammatory response syndrome) ([place]) 01/08/2021   Exertional chest pain 12/31/2020   SOB (shortness of breath) 12/24/2020   Hyperkalemia 10/15/2020   Sinus tachycardia 10/14/2020   Precordial pain 10/14/2020   Abnormal findings on diagnostic imaging of lung 08/19/2020   Physical deconditioning 08/19/2020   History of endometrial ablation 07/27/2020   History of alcohol use 07/20/2020   Current moderate episode of major depressive disorder without prior episode ([place]) 07/20/2020   Sickle cell  trait ([place]) 07/19/2020   Elevated d-dimer 07/13/2020   Healthcare maintenance 07/12/2020   Exertional dyspnea 07/12/2020   Atypical chest pain 07/12/2020   At risk for obstructive sleep apnea 07/12/2020   Acute on chronic diastolic (congestive) heart failure ([employer]) 06/22/2020   Generalized abdominal pain 03/08/2020   Chronic diastolic CHF (congestive heart failure) ([place]) 03/08/2020   Chronic anemia 12/19/2019   Elevated sed rate 11/26/2019   Elevated C-reactive protein (CRP) 11/26/2019   Hypoglycemia 02/07/2019   Lactic acidosis 02/07/2019   Right ankle pain 02/07/2019   Chronic obstructive pulmonary disease ([place])  02/05/2019   Vitamin D deficiency 05/31/2017   Gastroesophageal reflux disease 05/24/2017   Abnormal uterine bleeding (AUB) 05/17/2017   Anemia of chronic disease 04/06/2017   Knee pain, chronic 03/27/2016   Controlled type 2 diabetes mellitus without complication, with long-term current use of insulin ([place]) 03/27/2016   Essential hypertension 03/27/2016   Obesity, Class III, BMI 40-49.9 (morbid obesity) ([place]) 03/27/2016   Irritable bowel syndrome with constipation 03/27/2016   Tobacco dependence 03/27/2016   Arthropathy, lower leg 05/04/2013   CTS (carpal tunnel syndrome) 05/04/2013   Chronic pain 05/13/2012   Diabetic gastroparesis ([place]) 11/06/2006   Hot flashes 11/06/2006   Social History   Socioeconomic History   Marital status: Legally Separated    Spouse name: Not on file   Number of children: 3   Years of education: Not on file   Highest education level: Not on file  Occupational History   Occupation: unemployed  Tobacco Use   Smoking status: Some Days    Packs/day: 0.25    Years: 26.00    Pack years: 6.50    Types: Cigarettes    Last attempt to quit: 09/13/2020    Years since quitting: 1.0   Smokeless tobacco: Never   Tobacco comments:    4-5 cigarettes/day  Vaping Use   Vaping Use: Never used  Substance and Sexual Activity   Alcohol use: No    Drug use: No   Sexual activity: Not Currently    Birth control/protection: None  Other Topics Concern   Not on file  Social History Narrative   Right Handed   Lives in a one story apartment, but lives on the second floor   Drinks caffeine once in awhile   Social Determinants of Health   Financial Resource Strain: Not on file  Food Insecurity: No Food Insecurity   Worried About Charity fundraiser in the Last Year: Never true   Arboriculturist in the Last Year: Never true  Transportation Needs: No Transportation Needs   Lack of Transportation (Medical): No   Lack of Transportation (Non-Medical): No  Physical Activity: Not on file  Stress: Not on file  Social Connections: Not on file  Intimate Partner Violence: Not on file    Ms. Pooler's family history includes Diabetes in her mother; Hypertension in her mother; Migraines in her paternal grandfather.      Objective:    Vitals:   09/21/21 1426  BP: (!) 142/78  Pulse: (!) 110    Physical Exam.  Well-developed well-nourished African-American female in no acute distress.  Height, Weight, 267 BMI 45.8  HEENT; nontraumatic normocephalic, EOMI, PE R LA, sclera anicteric. Oropharynx; not examined today Neck; supple, no JVD Cardiovascular; regular rate and rhythm with S1-S2, no murmur rub or gallop Pulmonary; Clear bilaterally Abdomen; soft, obese , there is some tenderness across the upper abdomen, no guarding or rebound, nondistended, no palpable mass or hepatosplenomegaly, bowel sounds are active Rectal; not done today Skin; benign exam, no jaundice rash or appreciable lesions Extremities; no clubbing cyanosis or edema skin warm and dry Neuro/Psych; alert and oriented x4, grossly nonfocal mood and affect appropriate        Assessment & Plan:   #78 45 year old African-American female with 3 to 4-year history of fairly chronic abdominal pain present on a daily basis but with episodes of more severe pain.  She says  occasionally she will have a short period of time where she feels okay.  Abdominal pain usually associated with  nausea, intermittent vomiting, and diarrhea particularly with the more severe episodes. No improvement on PPI therapy, H2 blocker and antispasmodic. Etiology of symptoms is not clear.  I am concerned she may have diabetic gastroparesis, and also may have diabetic visceral neuropathy.\ Rule out chronic gastropathy Consider biliary colic as etiology for more severe episodes though would not account for her ongoing chronic pain.  Recent CT scan did show small layering gallstones  #2 intermittent solid food dysphagia-in setting of chronic GERD-rule out peptic stricture #3 fatty liver #4 adult onset diabetes mellitus, insulin-dependent #5 morbid obesity #6 sickle cell trait #7 COPD #8 Hypertension-poorly controlled #9   History of congestive heart failure-most recent echo EF 55 to 60% July 2021 #10 recent cardiac cath November 2022 normal coronary  Plan; continue Dexilant 30 mg p.o. every morning Stop famotidine Stop Bentyl/dicyclomine Continue Carafate 1 g p.o. twice daily to 3 times daily between meals She has prescriptions for both Zofran and Phenergan, may continue use of 1 antiemetic every 6 hours as needed.  Suggested she take a Zofran 4 mg each morning to see if this helps with her frequent nausea  Patient will be scheduled for upper endoscopy with possible esophageal dilation with Dr. Hilarie Fredrickson.  Procedure was discussed in detail with the patient including indications risks and benefits and she is agreeable to proceed Scheduled for gastric emptying scan CBC with differential and be met today Pending results of endoscopy and gastric emptying scan, if etiology still not clear she may benefit from referral to [facility] surgery to consider laparoscopic cholecystectomy . Patient will be established with Dr. Lucille Passy PA-C 09/21/2021   Cc: Vevelyn Francois, NP

## 2021-09-22 ENCOUNTER — Encounter: Payer: Self-pay | Admitting: Obstetrics and Gynecology

## 2021-09-22 NOTE — Progress Notes (Signed)
Patient was assessed and managed by nursing staff during this encounter. I have reviewed the chart and agree with the documentation and plan. I have also made any necessary editorial changes.  Caedon Bond A Senita Corredor, MD 09/22/2021 8:51 AM   

## 2021-09-23 ENCOUNTER — Other Ambulatory Visit: Payer: Self-pay

## 2021-09-23 ENCOUNTER — Telehealth: Payer: Self-pay | Admitting: Registered Nurse

## 2021-09-23 ENCOUNTER — Encounter: Payer: Medicaid Other | Admitting: Physical Medicine and Rehabilitation

## 2021-09-23 DIAGNOSIS — R109 Unspecified abdominal pain: Secondary | ICD-10-CM

## 2021-09-23 DIAGNOSIS — R112 Nausea with vomiting, unspecified: Secondary | ICD-10-CM

## 2021-09-23 DIAGNOSIS — K8020 Calculus of gallbladder without cholecystitis without obstruction: Secondary | ICD-10-CM

## 2021-09-23 MED ORDER — HYDROCODONE-ACETAMINOPHEN 10-325 MG PO TABS: 1.0000 | ORAL_TABLET | Freq: Three times a day (TID) | ORAL | 0 refills | Status: DC | PRN

## 2021-09-23 NOTE — Telephone Encounter (Signed)
PMP was Reviewed.  Hydrocodone e-scribed today.  Ms. Schmaltz has a scheduled appointment on 09/26/2021.  Ms. Lish will be informed regarding the above via My-Chart message.

## 2021-09-26 ENCOUNTER — Telehealth: Payer: Self-pay | Admitting: Gastroenterology

## 2021-09-26 ENCOUNTER — Encounter: Payer: Medicaid Other | Admitting: Registered Nurse

## 2021-09-26 NOTE — Telephone Encounter (Signed)
Patient returned your phone call from 12/9 regarding her results.  Please call back.  Thank you.

## 2021-09-26 NOTE — Progress Notes (Signed)
Addendum: Reviewed and agree with assessment and management plan. Montell Leopard M, MD  

## 2021-09-27 DIAGNOSIS — L299 Pruritus, unspecified: Secondary | ICD-10-CM | POA: Diagnosis not present

## 2021-09-27 DIAGNOSIS — L710 Perioral dermatitis: Secondary | ICD-10-CM | POA: Diagnosis not present

## 2021-09-27 DIAGNOSIS — L810 Postinflammatory hyperpigmentation: Secondary | ICD-10-CM | POA: Diagnosis not present

## 2021-09-27 DIAGNOSIS — L821 Other seborrheic keratosis: Secondary | ICD-10-CM | POA: Diagnosis not present

## 2021-09-27 DIAGNOSIS — L219 Seborrheic dermatitis, unspecified: Secondary | ICD-10-CM | POA: Diagnosis not present

## 2021-09-27 NOTE — Telephone Encounter (Signed)
-----   Message from Jill Shaw sent at 09/23/2021 12:36 PM EST ----- Pt states she will c/b to schedule  ----- Message ----- From: Evalee Jefferson, LPN Sent: 17/0/0174  12:21 PM EST To: April H Pait, Jill Shaw  Patient needs RUQ abd u/s to evaluate the gall bladder ASAP. Go any location to get this done next week. Thanks

## 2021-09-28 ENCOUNTER — Encounter (HOSPITAL_COMMUNITY)
Admission: RE | Admit: 2021-09-28 | Discharge: 2021-09-28 | Disposition: A | Discharge status: Home / Self Care | Payer: Medicaid Other | Source: Ambulatory Visit | Attending: Physician Assistant | Admitting: Physician Assistant

## 2021-09-28 DIAGNOSIS — R109 Unspecified abdominal pain: Secondary | ICD-10-CM

## 2021-09-28 DIAGNOSIS — R1310 Dysphagia, unspecified: Secondary | ICD-10-CM

## 2021-09-28 DIAGNOSIS — R112 Nausea with vomiting, unspecified: Secondary | ICD-10-CM | POA: Diagnosis present

## 2021-09-28 MED ORDER — TECHNETIUM TC 99M SULFUR COLLOID: 2.0000 | Freq: Once | INTRAVENOUS | Status: DC | PRN

## 2021-09-29 ENCOUNTER — Ambulatory Visit: Payer: Self-pay | Admitting: Nurse Practitioner

## 2021-09-29 ENCOUNTER — Encounter: Payer: Self-pay | Admitting: Obstetrics and Gynecology

## 2021-09-30 ENCOUNTER — Telehealth: Payer: Self-pay | Admitting: Physician Assistant

## 2021-09-30 ENCOUNTER — Ambulatory Visit (INDEPENDENT_AMBULATORY_CARE_PROVIDER_SITE_OTHER): Payer: Medicaid Other | Admitting: Nurse Practitioner

## 2021-09-30 ENCOUNTER — Encounter: Payer: Self-pay | Admitting: Nurse Practitioner

## 2021-09-30 ENCOUNTER — Other Ambulatory Visit: Payer: Self-pay

## 2021-09-30 VITALS — BP 154/75 | HR 101 | Temp 98.5°F | Ht 62.0 in | Wt 270.0 lb

## 2021-09-30 DIAGNOSIS — R29898 Other symptoms and signs involving the musculoskeletal system: Secondary | ICD-10-CM | POA: Diagnosis not present

## 2021-09-30 DIAGNOSIS — N921 Excessive and frequent menstruation with irregular cycle: Secondary | ICD-10-CM | POA: Diagnosis not present

## 2021-09-30 DIAGNOSIS — E119 Type 2 diabetes mellitus without complications: Secondary | ICD-10-CM | POA: Diagnosis not present

## 2021-09-30 DIAGNOSIS — D573 Sickle-cell trait: Secondary | ICD-10-CM

## 2021-09-30 DIAGNOSIS — Z794 Long term (current) use of insulin: Secondary | ICD-10-CM

## 2021-09-30 DIAGNOSIS — R232 Flushing: Secondary | ICD-10-CM

## 2021-09-30 DIAGNOSIS — M65311 Trigger thumb, right thumb: Secondary | ICD-10-CM

## 2021-09-30 LAB — POCT GLYCOSYLATED HEMOGLOBIN (HGB A1C)
HbA1c POC (<> result, manual entry): 7.9 % (ref 4.0–5.6)
HbA1c, POC (controlled diabetic range): 7.9 % — AB (ref 0.0–7.0)
HbA1c, POC (prediabetic range): 7.9 % — AB (ref 5.7–6.4)
Hemoglobin A1C: 7.9 % — AB (ref 4.0–5.6)

## 2021-09-30 MED ORDER — HYDROXYZINE PAMOATE 50 MG PO CAPS: 50.0000 mg | ORAL_CAPSULE | Freq: Three times a day (TID) | ORAL | 5 refills | Status: AC

## 2021-09-30 MED ORDER — NORETHINDRONE ACETATE 5 MG PO TABS: 15.0000 mg | ORAL_TABLET | Freq: Three times a day (TID) | ORAL | 0 refills | Status: DC

## 2021-09-30 NOTE — Telephone Encounter (Signed)
Patient called states she has not been feeling well since her procedure vomited yesterday and is seeking advise.

## 2021-09-30 NOTE — Patient Instructions (Signed)
Managing Anxiety, Adult ?After being diagnosed with anxiety, you may be relieved to know why you have felt or behaved a certain way. You may also feel overwhelmed about the treatment ahead and what it will mean for your life. With care and support, you can manage this condition. ?How to manage lifestyle changes ?Managing stress and anxiety ?Stress is your body's reaction to life changes and events, both good and bad. Most stress will last just a few hours, but stress can be ongoing and can lead to more than just stress. Although stress can play a major role in anxiety, it is not the same as anxiety. Stress is usually caused by something external, such as a deadline, test, or competition. Stress normally passes after the triggering event has ended.  ?Anxiety is caused by something internal, such as imagining a terrible outcome or worrying that something will go wrong that will devastate you. Anxiety often does not go away even after the triggering event is over, and it can become long-term (chronic) worry. It is important to understand the differences between stress and anxiety and to manage your stress effectively so that it does not lead to an anxious response. ?Talk with your health care provider or a counselor to learn more about reducing anxiety and stress. He or she may suggest tension reduction techniques, such as: ?Music therapy. Spend time creating or listening to music that you enjoy and that inspires you. ?Mindfulness-based meditation. Practice being aware of your normal breaths while not trying to control your breathing. It can be done while sitting or walking. ?Centering prayer. This involves focusing on a word, phrase, or sacred image that means something to you and brings you peace. ?Deep breathing. To do this, expand your stomach and inhale slowly through your nose. Hold your breath for 3-5 seconds. Then exhale slowly, letting your stomach muscles relax. ?Self-talk. Learn to notice and identify  thought patterns that lead to anxiety reactions and change those patterns to thoughts that feel peaceful. ?Muscle relaxation. Taking time to tense muscles and then relax them. ?Choose a tension reduction technique that fits your lifestyle and personality. These techniques take time and practice. Set aside 5-15 minutes a day to do them. Therapists can offer counseling and training in these techniques. The training to help with anxiety may be covered by some insurance plans. ?Other things you can do to manage stress and anxiety include: ?Keeping a stress diary. This can help you learn what triggers your reaction and then learn ways to manage your response. ?Thinking about how you react to certain situations. You may not be able to control everything, but you can control your response. ?Making time for activities that help you relax and not feeling guilty about spending your time in this way. ?Doing visual imagery. This involves imagining or creating mental pictures to help you relax. ?Practicing yoga. Through yoga poses, you can lower tension and promote relaxation. ? ?Medicines ?Medicines can help ease symptoms. Medicines for anxiety include: ?Antidepressant medicines. These are usually prescribed for long-term daily control. ?Anti-anxiety medicines. These may be added in severe cases, especially when panic attacks occur. ?Medicines will be prescribed by a health care provider. When used together, medicines, psychotherapy, and tension reduction techniques may be the most effective treatment. ?Relationships ?Relationships can play a big part in helping you recover. Try to spend more time connecting with trusted friends and family members. ?Consider going to couples counseling if you have a partner, taking family education classes, or going to family   therapy. ?Therapy can help you and others better understand your condition. ?How to recognize changes in your anxiety ?Everyone responds differently to treatment for  anxiety. Recovery from anxiety happens when symptoms decrease and stop interfering with your daily activities at home or work. This may mean that you will start to: ?Have better concentration and focus. Worry will interfere less in your daily thinking. ?Sleep better. ?Be less irritable. ?Have more energy. ?Have improved memory. ?It is also important to recognize when your condition is getting worse. Contact your health care provider if your symptoms interfere with home or work and you feel like your condition is not improving. ?Follow these instructions at home: ?Activity ?Exercise. Adults should do the following: ?Exercise for at least 150 minutes each week. The exercise should increase your heart rate and make you sweat (moderate-intensity exercise). ?Strengthening exercises at least twice a week. ?Get the right amount and quality of sleep. Most adults need 7-9 hours of sleep each night. ?Lifestyle ? ?Eat a healthy diet that includes plenty of vegetables, fruits, whole grains, low-fat dairy products, and lean protein. ?Do not eat a lot of foods that are high in fats, added sugars, or salt (sodium). ?Make choices that simplify your life. ?Do not use any products that contain nicotine or tobacco. These products include cigarettes, chewing tobacco, and vaping devices, such as e-cigarettes. If you need help quitting, ask your health care provider. ?Avoid caffeine, alcohol, and certain over-the-counter cold medicines. These may make you feel worse. Ask your pharmacist which medicines to avoid. ?General instructions ?Take over-the-counter and prescription medicines only as told by your health care provider. ?Keep all follow-up visits. This is important. ?Where to find support ?You can get help and support from these sources: ?Self-help groups. ?Online and community organizations. ?A trusted spiritual leader. ?Couples counseling. ?Family education classes. ?Family therapy. ?Where to find more information ?You may find  that joining a support group helps you deal with your anxiety. The following sources can help you locate counselors or support groups near you: ?Mental Health America: www.mentalhealthamerica.net ?Anxiety and Depression Association of America (ADAA): www.adaa.org ?National Alliance on Mental Illness (NAMI): www.nami.org ?Contact a health care provider if: ?You have a hard time staying focused or finishing daily tasks. ?You spend many hours a day feeling worried about everyday life. ?You become exhausted by worry. ?You start to have headaches or frequently feel tense. ?You develop chronic nausea or diarrhea. ?Get help right away if: ?You have a racing heart and shortness of breath. ?You have thoughts of hurting yourself or others. ?If you ever feel like you may hurt yourself or others, or have thoughts about taking your own life, get help right away. Go to your nearest emergency department or: ?Call your local emergency services (911 in the [place] ?Call a suicide crisis helpline, such as the National Suicide Prevention Lifeline at 1-800-273-8255 or 988 in the [place] This is open 24 hours a day in the [place] ?Text the Crisis Text Line at 741741 (in the [place] ?Summary ?Taking steps to learn and use tension reduction techniques can help calm you and help prevent triggering an anxiety reaction. ?When used together, medicines, psychotherapy, and tension reduction techniques may be the most effective treatment. ?Family, friends, and partners can play a big part in supporting you. ?This information is not intended to replace advice given to you by your health care provider. Make sure you discuss any questions you have with your health care provider. ?Document Revised: 04/27/2021 Document Reviewed: 01/23/2021 ?Elsevier Patient   Education ? 2022 [employer] ? ?

## 2021-09-30 NOTE — Progress Notes (Signed)
[facility] General Follow Up Note  09/30/2021 Name: JACQUELINA HEWINS MRN: [id number] DOB: [birth year] BERTHE OLEY is a 45 y.o. year old female who sees Barbette Merino, NP for primary care. LCSW was consulted to assist patient with housing resources.  Interpreter: No.   Interpreter Name & Language: none  Assessment: Patient experiencing unstable housing situation. Her landlord has advised that she will not be able to renew her lease. She and her mother live in their current apartment and both have health issues; a move would would be very difficult for them. She indicated her landlord may also be overcharging for water and sewer. She is in the process of a disability claim.  Ongoing Intervention: Today CSW provided patient with list of other affordable housing options in the area. Referred patient to [employer], as her mother would be eligible to apply for public housing voucher. Also advised patient to consult with the [employer] program that holds clinic [place] regarding the lease issue with her landlord.   Review of patient status, including review of consultants reports, relevant laboratory and other test results, and collaboration with appropriate care team members and the patient's provider was performed as part of comprehensive patient evaluation and provision of services.    Abigail Butts, LCSW [facility] [phone]

## 2021-09-30 NOTE — Progress Notes (Signed)
[facility] Patient [facility] [address] [place], [place]  [address] Phone:  [phone]   Fax:  [phone]   Established Patient Office Visit  Subjective:  Patient ID: Jill Shaw, female    DOB: 1976-03-02  Age: 45 y.o. MRN: [id number]  CC:  Chief Complaint  Patient presents with   Follow-up    Pt is here today for her 2 month follow up. Pt states that the last time she was seen with other provider Jill Passmore,NP. She was told that a letter would be typed up about her mobility of her  right thumbs and both handsbeing very weak. Pt states she has not seen any notes nor were they put in her AVS.      HPI Jill Shaw presents for follow up. She  has a past medical history of Anemia, Arthritis, Asthma, Chronic diastolic (congestive) heart failure (HCC), COPD (chronic obstructive pulmonary disease) (HCC), Diabetes mellitus without complication (HCC), DKA (diabetic ketoacidosis) (HCC) (10/15/2020), Dysfunctional uterine bleeding, GERD (gastroesophageal reflux disease), Hypertension, Neuromuscular disorder (HCC), Seizures (HCC) (09/12/2017), Sickle cell trait (HCC), Smoker, Vitamin D deficiency (10/2019), and Wears glasses.   She has right thumb trigger finger. She has weakness in her hand. She has a brace that is used however she wants a referral back to ortho.   She called in for a treatment for anxiety. On 09/15/21; she was started on hydroxyzine 25 mg TID. She reports that she has been taking this as directed however does not feel like it is very effective. She is stressed with housing. She is being evicted without a cause and has until February to move. She reports that she is unable to afford new housing or the move. She continues to strive for her disability. She has recently (2022) been awarded  after many years without coverage and chronic disease.   She is grateful to be able to now get the additional specialize care that she needs. She continues to undergo  imaging and testing. In the last 3 months she has been evaluated by cardiology, pain management, GI and dermatology. Endocrinology has adjusted her insulin regimen and she is doing well with her DM  She is concern about her hot flashes. She was told there her TSH was normal in normal range. She is on Depo for increase bleeding.   Past Medical History:  Diagnosis Date   Anemia    Arthritis    knees, hands   Asthma    Chronic diastolic (congestive) heart failure (HCC)    COPD (chronic obstructive pulmonary disease) (HCC)    Diabetes mellitus without complication (HCC)    type 2   DKA (diabetic ketoacidosis) (HCC) 10/15/2020   Dysfunctional uterine bleeding    GERD (gastroesophageal reflux disease)    Hypertension    Neuromuscular disorder (HCC)    neuropathy feet   Seizures (HCC) 09/12/2017   pt states r/t stress and blood sugar - no meds last one 4 months ago, not seen neurologist   Sickle cell trait (HCC)    Smoker    Vitamin D deficiency 10/2019   Wears glasses     Past Surgical History:  Procedure Laterality Date   CESAREAN SECTION     x 1. for twins   DILATION AND CURETTAGE OF UTERUS N/A 08/20/2019   Procedure: DILATATION AND CURETTAGE;  Surgeon: Allie Bossier, MD;  Location: [facility];  Service: Gynecology;  Laterality: N/A;   ENDOMETRIAL ABLATION N/A 08/20/2019   Procedure: Minerva Ablation;  Surgeon: Emily Filbert, MD;  Location: [facility];  Service: Gynecology;  Laterality: N/A;   EYE SURGERY Bilateral    laser right and cataract removed left eye   LEFT HEART CATH AND CORONARY ANGIOGRAPHY N/A 08/30/2021   Procedure: LEFT HEART CATH AND CORONARY ANGIOGRAPHY;  Surgeon: Nigel Mormon, MD;  Location: [facility];  Service: Cardiovascular;  Laterality: N/A;   RADIOLOGY WITH ANESTHESIA N/A 09/16/2019   Procedure: MRI WITH ANESTHESIA   L SPINE WITHOUT CONTRAST, T SPINE WITHOUT CONTRAST , CERVICAL WITHOUT CONTRAST;  Surgeon: Radiologist, Medication, MD;   Location: [facility];  Service: Radiology;  Laterality: N/A;   TUBAL LIGATION     interval BTL   UPPER GI ENDOSCOPY  07/2017    Family History  Problem Relation Age of Onset   Diabetes Mother    Hypertension Mother    Migraines Paternal Grandfather     Social History   Socioeconomic History   Marital status: Legally Separated    Spouse name: Not on file   Number of children: 3   Years of education: Not on file   Highest education level: Not on file  Occupational History   Occupation: unemployed  Tobacco Use   Smoking status: Some Days    Packs/day: 0.25    Years: 26.00    Pack years: 6.50    Types: Cigarettes    Last attempt to quit: 09/13/2020    Years since quitting: 1.0   Smokeless tobacco: Never   Tobacco comments:    4-5 cigarettes/day  Vaping Use   Vaping Use: Never used  Substance and Sexual Activity   Alcohol use: No   Drug use: No   Sexual activity: Not Currently    Birth control/protection: None  Other Topics Concern   Not on file  Social History Narrative   Right Handed   Lives in a one story apartment, but lives on the second floor   Drinks caffeine once in awhile   Social Determinants of Health   Financial Resource Strain: Not on file  Food Insecurity: No Food Insecurity   Worried About Charity fundraiser in the Last Year: Never true   [place] in the Last Year: Never true  Transportation Needs: No Transportation Needs   Lack of Transportation (Medical): No   Lack of Transportation (Non-Medical): No  Physical Activity: Not on file  Stress: Not on file  Social Connections: Not on file  Intimate Partner Violence: Not on file    Outpatient Medications Prior to Visit  Medication Sig Dispense Refill   Accu-Chek Softclix Lancets lancets USE AS DIRECTED UP TO FOUR TIMES DAILY 100 each 3   albuterol (PROVENTIL) (2.5 MG/3ML) 0.083% nebulizer solution Take 3 mLs (2.5 mg total) by nebulization every 6 (six) hours as needed  for wheezing or shortness of breath. 150 mL 6   albuterol (VENTOLIN HFA) 108 (90 Base) MCG/ACT inhaler Inhale 2 puffs into the lungs every 6 (six) hours as needed for wheezing or shortness of breath. 8 g 6   aspirin EC 81 MG tablet Take 1 tablet (81 mg total) by mouth daily. 90 tablet 3   blood glucose meter kit and supplies KIT 1 each by Other route See admin instructions. Dispense based on patient and insurance preference. Use up to four times daily as directed. (FOR ICD-9 250.00, 250.01). 1 each 2   Blood Pressure Monitoring (ADULT BLOOD PRESSURE CUFF LG) KIT 1 kit by Does not apply route daily.  1 kit 0   budesonide-formoterol (SYMBICORT) 160-4.5 MCG/ACT inhaler INHALE 2 PUFFS INTO THE LUNGS 2 (TWO) TIMES DAILY. 10.2 g 6   cetirizine (ZYRTEC) 10 MG tablet Take 1 tablet (10 mg total) by mouth daily. 30 tablet 3   Continuous Blood Gluc Sensor (DEXCOM G6 SENSOR) MISC 1 Device by Does not apply route as directed. 9 each 3   Continuous Blood Gluc Transmit (DEXCOM G6 TRANSMITTER) MISC 1 Device by Does not apply route as directed. 1 each 3   Dexlansoprazole (DEXILANT) 30 MG capsule Take 1 capsule (30 mg total) by mouth daily. 30 capsule 11   empagliflozin (JARDIANCE) 25 MG TABS tablet Take 1 tablet (25 mg total) by mouth daily before breakfast. 90 tablet 3   ferrous sulfate 325 (65 FE) MG tablet Take 1 tablet (325 mg total) by mouth 3 (three) times daily with meals. 270 tablet 3   fluticasone (FLONASE) 50 MCG/ACT nasal spray Place 2 sprays into both nostrils daily. 16 g 6   furosemide (LASIX) 40 MG tablet TAKE 1 TABLET($RemoveBefor'40MG'aLxaJBxnGHtn$  TOTAL) BY MOUTH TWICE DAILY 180 tablet 1   Glucosamine Sulfate 1000 MG CAPS Take 1 capsule (1,000 mg total) by mouth 2 (two) times daily. (Patient taking differently: Take 1,000 mg by mouth 2 (two) times daily.) 180 capsule 3   glucose blood (ACCU-CHEK GUIDE) test strip USE AS DIRECTED UP TO FOUR TIMES DAILY 100 strip 4   haloperidol (HALDOL) 5 MG tablet Take 0.5-1  tablets (2.5-5 mg total) by mouth every 8 (eight) hours as needed (For nausea/vomiting). 15 tablet 0   Heating Pads (HEATING PAD MOIST/DRY Maurissa Ambrose SZ) PADS 1 each by Does not apply route 4 (four) times daily as needed. 120 each 1   hydrochlorothiazide (MICROZIDE) 12.5 MG capsule TAKE 1 CAPSULE(12.5 MG) BY MOUTH DAILY 30 capsule 3   HYDROcodone-acetaminophen (NORCO) 10-325 MG tablet Take 1 tablet by mouth 3 (three) times daily as needed. 90 tablet 0   hydrocortisone 2.5 % cream Apply topically 2 (two) times daily. (Patient taking differently: Apply 1 application topically 2 (two) times daily.) 30 g 11   hydroquinone 4 % cream APPLY TOPICALLY TWICE DAILY (Patient taking differently: Apply 1 application topically 2 (two) times daily as needed (pain).) 28.35 g 0   hydrOXYzine (ATARAX) 25 MG tablet Take 1 tablet (25 mg total) by mouth every 8 (eight) hours as needed. Anxiety 60 tablet 0   insulin glargine (LANTUS SOLOSTAR) 100 UNIT/ML Solostar Pen Inject 110 Units into the skin daily. 105 mL 3   insulin lispro (HUMALOG KWIKPEN) 200 UNIT/ML KwikPen Max daily 180 units 90 mL 3   Insulin Pen Needle 31G X 8 MM MISC 1 Device by Does not apply route in the morning, at noon, in the evening, and at bedtime. 400 each 3   isosorbide mononitrate (IMDUR) 30 MG 24 hr tablet Take 1 tablet (30 mg total) by mouth daily. 90 tablet 3   losartan (COZAAR) 100 MG tablet Take 1 tablet (100 mg total) by mouth daily. TAKE 1 TABLET($RemoveBefor'50MG'HiwKTcxqFnTE$  TOTAL) BY MOUTH EVERY DAY 30 tablet 3   metoprolol succinate (TOPROL-XL) 100 MG 24 hr tablet Take 100 mg by mouth daily. Take with or immediately following a meal.     norethindrone (AYGESTIN) 5 MG tablet Take 3 tablets (15 mg total) by mouth in the morning, at noon, and at bedtime. 270 tablet 0   ondansetron (ZOFRAN ODT) 4 MG disintegrating tablet Take 1 tablet (4 mg total) by mouth every 8 (eight) hours as  needed for nausea or vomiting. 20 tablet 0   potassium chloride SA (KLOR-CON)  20 MEQ tablet TAKE 1 TABLET(20 MEQ) BY MOUTH DAILY (Patient taking differently: Take 20 mEq by mouth daily.) 30 tablet 6   promethazine (PHENERGAN) 25 MG tablet Take 1 tablet (25 mg total) by mouth every 6 (six) hours as needed for nausea or vomiting. 30 tablet 0   rosuvastatin (CRESTOR) 20 MG tablet Take 1 tablet (20 mg total) by mouth daily. 30 tablet 3   saxagliptin HCl (ONGLYZA) 5 MG TABS tablet Take 1 tablet (5 mg total) by mouth daily. 90 tablet 3   senna-docusate (SENOKOT-S) 8.6-50 MG tablet Take 1 tablet by mouth at bedtime as needed for mild constipation. 30 tablet 0   spironolactone (ALDACTONE) 50 MG tablet TAKE 1 TABLET(50 MG) BY MOUTH DAILY (Patient taking differently: Take 50 mg by mouth daily.) 30 tablet 3   sucralfate (CARAFATE) 1 g tablet Take 1 tablet (1 g total) by mouth 4 (four) times daily -  with meals and at bedtime. 90 tablet 0   Tiotropium Bromide Monohydrate (SPIRIVA RESPIMAT) 2.5 MCG/ACT AERS Inhale 2 puffs into the lungs daily. 4 g 6   tiZANidine (ZANAFLEX) 4 MG tablet Take 1.5 tablets (6 mg total) by mouth every 6 (six) hours as needed for muscle spasms. 120 tablet 3   TOBRADEX ophthalmic solution Place 1 drop into the right eye 4 (four) times daily.     Vitamin D, Ergocalciferol, (DRISDOL) 1.25 MG (50000 UNIT) CAPS capsule Take 1 capsule (50,000 Units total) by mouth every Sunday for 8 doses. 8 capsule 0   vitamin E (VITAMIN E) 180 MG (400 UNITS) capsule Take 1 capsule (400 Units total) by mouth daily. 30 capsule 1   norethindrone (AYGESTIN) 5 MG tablet Take 3 tablets (15 mg total) by mouth 3 (three) times daily. With bleeding 270 tablet 0   Facility-Administered Medications Prior to Visit  Medication Dose Route Frequency Provider Last Rate Last Admin   technetium sulfur colloid (NYCOMED-[place]) injection solution 2 millicurie  2 millicurie Intravenous Once PRN Abigail Miyamoto, MD        Allergies  Allergen Reactions   Amitriptyline Other (See Comments)     Coma   Ketoprofen Nausea And Vomiting   Trazodone Nausea And Vomiting   Aspirin Nausea Only   Gabapentin Nausea And Vomiting and Other (See Comments)    upset stomach   Ibuprofen Nausea And Vomiting   Liraglutide Nausea And Vomiting   Naproxen Nausea And Vomiting   Omeprazole-Sodium Bicarbonate Nausea And Vomiting   Sulfa Antibiotics Nausea And Vomiting   Tramadol Nausea And Vomiting and Other (See Comments)    stomach upset    ROS Review of Systems    Objective:    Physical Exam Constitutional:      General: She is not in acute distress.    Appearance: She is obese.  HENT:     Head: Atraumatic.     Nose: Nose normal.     Mouth/Throat:     Mouth: Mucous membranes are moist.  Cardiovascular:     Rate and Rhythm: Normal rate and regular rhythm.     Pulses: Normal pulses.     Heart sounds: Normal heart sounds.  Pulmonary:     Effort: Pulmonary effort is normal.     Comments: Diminished  Abdominal:     Comments: Increased abdominal girth   Musculoskeletal:        General: Normal range of motion.  Cervical back: Normal range of motion.     Right lower leg: No edema.     Left lower leg: No edema.  Skin:    General: Skin is warm.     Capillary Refill: Capillary refill takes less than 2 seconds.     Comments: taunt  Neurological:     General: No focal deficit present.     Mental Status: She is alert and oriented to person, place, and time.  Psychiatric:        Behavior: Behavior normal.        Thought Content: Thought content normal.        Judgment: Judgment normal.     Comments: Anxious    BP (!) 154/75 (BP Location: Left Arm, Cuff Size: Large)    Pulse (!) 101    Temp 98.5 F (36.9 C)    Ht $R'5\' 2"'SS$  (1.575 m)    Wt 270 lb (122.5 kg)    LMP  (LMP Unknown)    SpO2 98%    BMI 49.38 kg/m  Wt Readings from Last 3 Encounters:  09/30/21 270 lb (122.5 kg)  09/21/21 267 lb 4 oz (121.2 kg)  09/21/21 268 lb 1.6 oz (121.6 kg)     Health Maintenance Due   Topic Date Due   FOOT EXAM  06/02/2021    There are no preventive care reminders to display for this patient.  Lab Results  Component Value Date   TSH 2.330 07/29/2021   Lab Results  Component Value Date   WBC 13.1 (H) 09/21/2021   HGB 12.9 09/21/2021   HCT 40.3 09/21/2021   MCV 75.1 (L) 09/21/2021   PLT 274.0 09/21/2021   Lab Results  Component Value Date   NA 140 09/21/2021   K 3.4 (L) 09/21/2021   CO2 28 09/21/2021   GLUCOSE 118 (H) 09/21/2021   BUN 13 09/21/2021   CREATININE 1.10 09/21/2021   BILITOT 0.6 08/21/2021   ALKPHOS 69 08/21/2021   AST 17 08/21/2021   ALT 8 08/21/2021   PROT 7.9 08/21/2021   ALBUMIN 4.6 08/21/2021   CALCIUM 10.0 09/21/2021   ANIONGAP 14 08/21/2021   EGFR 76 07/29/2021   GFR 60.99 09/21/2021   Lab Results  Component Value Date   CHOL 125 07/29/2021   Lab Results  Component Value Date   HDL 39 (L) 07/29/2021   Lab Results  Component Value Date   LDLCALC 48 07/29/2021   Lab Results  Component Value Date   TRIG 238 (H) 07/29/2021   Lab Results  Component Value Date   CHOLHDL 3.2 07/29/2021   Lab Results  Component Value Date   HGBA1C 7.9 (A) 09/30/2021   HGBA1C 7.9 09/30/2021   HGBA1C 7.9 (A) 09/30/2021   HGBA1C 7.9 (A) 09/30/2021      Assessment & Plan:   Problem List Items Addressed This Visit       Cardiovascular and Mediastinum   Hot flashes   Relevant Orders   Hormone Panel     Endocrine   Type 2 diabetes mellitus without complication, with long-term current use of insulin (HCC) - Primary   Relevant Orders   HgB A1c (Completed)     Other   Sickle cell trait ([facility])   Other Visit Diagnoses     Trigger finger of right thumb       Relevant Orders   AMB referral to orthopedics   Right hand weakness       Relevant Orders   AMB referral  to orthopedics   Breakthrough bleeding on Depo-Provera           Meds ordered this encounter  Medications   hydrOXYzine (VISTARIL) 50 MG capsule    Sig: Take  1 capsule (50 mg total) by mouth 3 (three) times daily.    Dispense:  90 capsule    Refill:  5    Order Specific Question:   Supervising Provider    Answer:   Tresa Garter [[id number]]    Follow-up: Return in about 3 months (around 12/29/2021).    Vevelyn Francois, NP

## 2021-09-30 NOTE — Telephone Encounter (Signed)
Spoke with the patient. She tells me this is an ongoing issue. She had a gastric emptying scan on 09/28/21. Wonders if this is causing her nausea and vomiting today. Her "stomach is hurting too. I took a pain pill and that makes it hurt a little less." She took Zofran this morning as well. She reports she takes her nausea medication and her pain medication only when she needs it. GES was normal. She agrees to call and schedule her abdominal u/s today. She had declined to schedule earlier because she did not have her calendar when Scheduling called. Then she lost the phone number. She is scheduled for her EGD 10/03/21 and plans the keep that appointment.  Patient understands supportive measures for her nausea as well as how to take her medications correctly for the nausea. Per her request, the phone number for radiology scheduling is sent to her through My Chart. The patient is at another doctor appointment right now.

## 2021-10-01 ENCOUNTER — Encounter: Payer: Self-pay | Admitting: Nurse Practitioner

## 2021-10-02 ENCOUNTER — Other Ambulatory Visit: Payer: Self-pay | Admitting: Obstetrics and Gynecology

## 2021-10-03 ENCOUNTER — Other Ambulatory Visit: Payer: Self-pay | Admitting: Obstetrics and Gynecology

## 2021-10-03 ENCOUNTER — Encounter: Payer: Self-pay | Admitting: Internal Medicine

## 2021-10-03 ENCOUNTER — Ambulatory Visit (AMBULATORY_SURGERY_CENTER): Payer: Medicaid Other | Admitting: Internal Medicine

## 2021-10-03 ENCOUNTER — Other Ambulatory Visit: Payer: Self-pay

## 2021-10-03 VITALS — BP 177/90 | HR 90 | Temp 98.6°F | Resp 20 | Ht 64.0 in | Wt 267.0 lb

## 2021-10-03 DIAGNOSIS — R1310 Dysphagia, unspecified: Secondary | ICD-10-CM | POA: Diagnosis not present

## 2021-10-03 DIAGNOSIS — K2900 Acute gastritis without bleeding: Secondary | ICD-10-CM

## 2021-10-03 DIAGNOSIS — Z720 Tobacco use: Secondary | ICD-10-CM | POA: Diagnosis not present

## 2021-10-03 DIAGNOSIS — K219 Gastro-esophageal reflux disease without esophagitis: Secondary | ICD-10-CM | POA: Diagnosis not present

## 2021-10-03 DIAGNOSIS — E119 Type 2 diabetes mellitus without complications: Secondary | ICD-10-CM | POA: Diagnosis not present

## 2021-10-03 DIAGNOSIS — R109 Unspecified abdominal pain: Secondary | ICD-10-CM | POA: Diagnosis not present

## 2021-10-03 DIAGNOSIS — J45909 Unspecified asthma, uncomplicated: Secondary | ICD-10-CM | POA: Diagnosis not present

## 2021-10-03 DIAGNOSIS — J449 Chronic obstructive pulmonary disease, unspecified: Secondary | ICD-10-CM | POA: Diagnosis not present

## 2021-10-03 DIAGNOSIS — K2950 Unspecified chronic gastritis without bleeding: Secondary | ICD-10-CM | POA: Diagnosis not present

## 2021-10-03 DIAGNOSIS — R112 Nausea with vomiting, unspecified: Secondary | ICD-10-CM | POA: Diagnosis not present

## 2021-10-03 MED ORDER — SODIUM CHLORIDE 0.9 % IV SOLN: 500.0000 mL | Freq: Once | INTRAVENOUS | Status: DC

## 2021-10-03 NOTE — Patient Instructions (Signed)
Please read handouts provided. Continue present medications. Await pathology results. Resume previous diet. Screening colonoscopy recommended at age 45.   YOU HAD AN ENDOSCOPIC PROCEDURE TODAY AT THE [facility]:   Refer to the procedure report that was given to you for any specific questions about what was found during the examination.  If the procedure report does not answer your questions, please call your gastroenterologist to clarify.  If you requested that your care partner not be given the details of your procedure findings, then the procedure report has been included in a sealed envelope for you to review at your convenience later.  YOU SHOULD EXPECT: Some feelings of bloating in the abdomen. Passage of more gas than usual.  Walking can help get rid of the air that was put into your GI tract during the procedure and reduce the bloating. If you had a lower endoscopy (such as a colonoscopy or flexible sigmoidoscopy) you may notice spotting of blood in your stool or on the toilet paper. If you underwent a bowel prep for your procedure, you may not have a normal bowel movement for a few days.  Please Note:  You might notice some irritation and congestion in your nose or some drainage.  This is from the oxygen used during your procedure.  There is no need for concern and it should clear up in a day or so.  SYMPTOMS TO REPORT IMMEDIATELY:    Following upper endoscopy (EGD)  Vomiting of blood or coffee ground material  New chest pain or pain under the shoulder blades  Painful or persistently difficult swallowing  New shortness of breath  Fever of 100F or higher  Black, tarry-looking stools  For urgent or emergent issues, a gastroenterologist can be reached at any hour by calling [phone]. Do not use MyChart messaging for urgent concerns.    DIET:  We do recommend a small meal at first, but then you may proceed to your regular diet.  Drink plenty of fluids but you  should avoid alcoholic beverages for 24 hours.  ACTIVITY:  You should plan to take it easy for the rest of today and you should NOT DRIVE or use heavy machinery until tomorrow (because of the sedation medicines used during the test).    FOLLOW UP: Our staff will call the number listed on your records 48-72 hours following your procedure to check on you and address any questions or concerns that you may have regarding the information given to you following your procedure. If we do not reach you, we will leave a message.  We will attempt to reach you two times.  During this call, we will ask if you have developed any symptoms of COVID 19. If you develop any symptoms (ie: fever, flu-like symptoms, shortness of breath, cough etc.) before then, please call [phone].  If you test positive for Covid 19 in the 2 weeks post procedure, please call and report this information to [place].    If any biopsies were taken you will be contacted by phone or by letter within the next 1-3 weeks.  Please call us at [phone] if you have not heard about the biopsies in 3 weeks.    SIGNATURES/CONFIDENTIALITY: You and/or your care partner have signed paperwork which will be entered into your electronic medical record.  These signatures attest to the fact that that the information above on your After Visit Summary has been reviewed and is understood.  Full responsibility of the confidentiality of this  discharge information lies with you and/or your care-partner.

## 2021-10-03 NOTE — Progress Notes (Signed)
Called to room to assist during endoscopic procedure.  Patient ID and intended procedure confirmed with present staff. Received instructions for my participation in the procedure from the performing physician.  

## 2021-10-03 NOTE — Progress Notes (Signed)
Pt's states no medical or surgical changes since previsit or office visit. 

## 2021-10-03 NOTE — Progress Notes (Signed)
Report to PACU, RN, vss, BBS= Clear.  

## 2021-10-03 NOTE — Op Note (Signed)
[facility] Patient Name: Jill Shaw Procedure Date: 10/03/2021 10:06 AM MRN: [id number] Endoscopist: Jerene Bears , MD Age: 45 Referring MD:  Date of Birth: 30-Apr-1976 Gender: Female Account #: [id number] Procedure:                Upper GI endoscopy Indications:              Generalized abdominal pain, Dysphagia Medicines:                Monitored Anesthesia Care Procedure:                Pre-Anesthesia Assessment:                           - Prior to the procedure, a History and Physical                            was performed, and patient medications and                            allergies were reviewed. The patient's tolerance of                            previous anesthesia was also reviewed. The risks                            and benefits of the procedure and the sedation                            options and risks were discussed with the patient.                            All questions were answered, and informed consent                            was obtained. Prior Anticoagulants: The patient has                            taken no previous anticoagulant or antiplatelet                            agents. ASA Grade Assessment: III - A patient with                            severe systemic disease. After reviewing the risks                            and benefits, the patient was deemed in                            satisfactory condition to undergo the procedure.                           After obtaining informed consent, the endoscope was  passed under direct vision. Throughout the                            procedure, the patient's blood pressure, pulse, and                            oxygen saturations were monitored continuously. The                            GIF [id number] was introduced through the                            mouth, and advanced to the second part of duodenum.                            The upper GI  endoscopy was accomplished without                            difficulty. The patient tolerated the procedure                            well. Scope In: Scope Out: Findings:                 No endoscopic abnormality was evident in the                            esophagus to explain the patient's complaint of                            dysphagia. It was decided, however, to proceed with                            dilation of the middle third of the esophagus, of                            the lower third of the esophagus and at the                            gastroesophageal junction. A TTS dilator was passed                            through the scope. Dilation with a 16-17-18 mm                            balloon dilator was performed to 18 mm. The                            dilation site was examined and showed no change.                           Patchy moderate inflammation characterized by  erythema and granularity was found in the gastric                            body and in the gastric antrum. Biopsies were taken                            with a cold forceps for histology and Helicobacter                            pylori testing.                           The cardia and gastric fundus were normal on                            retroflexion.                           The examined duodenum was normal. Complications:            No immediate complications. Estimated Blood Loss:     Estimated blood loss was minimal. Impression:               - No endoscopic esophageal abnormality to explain                            patient's dysphagia. Esophagus dilated to 18 mm                            with balloon                           - Gastritis. Biopsied.                           - Normal examined duodenum. Recommendation:           - Patient has a contact number available for                            emergencies. The signs and symptoms of potential                             delayed complications were discussed with the                            patient. Return to normal activities tomorrow.                            Written discharge instructions were provided to the                            patient.                           - Resume previous diet.                           -  Continue present medications.                           - Await pathology results.                           - If dysphagia symptoms persist then barium                            esophagram with tablet is recommended.                           - Screening colonoscopy recommended at age 65. Jerene Bears, MD 10/03/2021 10:34:48 AM This report has been signed electronically.

## 2021-10-03 NOTE — Progress Notes (Signed)
Patient seen in the office on 09/21/2021 by Mike Gip, PA-C See that note for details.  Patient presents today for evaluation of chronic abdominal pain and dysphagia symptom.  Patient remains appropriate for EGD in the outpatient amatory setting today

## 2021-10-04 ENCOUNTER — Telehealth: Payer: Self-pay | Admitting: General Practice

## 2021-10-04 ENCOUNTER — Telehealth: Payer: Self-pay

## 2021-10-04 DIAGNOSIS — N939 Abnormal uterine and vaginal bleeding, unspecified: Secondary | ICD-10-CM

## 2021-10-04 MED ORDER — NORETHINDRONE ACETATE 5 MG PO TABS
10.0000 mg | ORAL_TABLET | Freq: Two times a day (BID) | ORAL | 2 refills | Status: DC | PRN
Start: 2021-10-04 — End: 2022-02-08

## 2021-10-04 NOTE — Telephone Encounter (Signed)
Spoke with Dr [name] regarding denied PA for aygestin. He prescribes new lower dose of 10mg  BID daily prn for bleeding and follow up office visit in February with depo dose. Med ordered.   Called patient and discussed lower dose with her and appt in February. Patient verbalized understanding and states she tried that dose before and it didn't work which is why she changed the dose herself to 8-9 pills a day. Advised patient to try new dose and if her bleeding worsens to call March back and let us know. Patient verbalized understanding.

## 2021-10-04 NOTE — Telephone Encounter (Signed)
Pt is wanting a generic brand cough medicine for insurance purposes.

## 2021-10-05 ENCOUNTER — Other Ambulatory Visit: Payer: Self-pay | Admitting: Nurse Practitioner

## 2021-10-05 ENCOUNTER — Telehealth: Payer: Self-pay

## 2021-10-05 MED ORDER — AMOXICILLIN-POT CLAVULANATE 875-125 MG PO TABS: 1.0000 | ORAL_TABLET | Freq: Two times a day (BID) | ORAL | 0 refills | Status: AC

## 2021-10-05 MED ORDER — BENZONATATE 100 MG PO CAPS: 100.0000 mg | ORAL_CAPSULE | Freq: Three times a day (TID) | ORAL | 0 refills | Status: AC | PRN

## 2021-10-05 NOTE — Telephone Encounter (Signed)
Attempted a 3rd phone call to pt to ensure that sh sought medical attention for the chest pain she c/o during f/u phone call this morning. Pt did not answer. Left a detailed message that Dr. Rhea Belton did recommend to seek care at the ER and to have a CXR. Gave number to call back if pt needed any further assistance. RN did note that pt communicated with PCP office yesterday and c/o chest congestion and coughing up yellow mucous. Pt did not report this during phone conversation this AM.

## 2021-10-05 NOTE — Telephone Encounter (Signed)
No answer, LMOM to call office back to discuss Dr. Dorita Sciara recommendations

## 2021-10-05 NOTE — Telephone Encounter (Signed)
Patient called back and wanted to let you know she was following up with her heart doctor tomorrow.

## 2021-10-05 NOTE — Telephone Encounter (Signed)
Agree will ER eval if ongoing active chest pain Would also recommended PA/lateral Chest xray

## 2021-10-05 NOTE — Telephone Encounter (Signed)
°  Follow up Call-  Call back number 10/03/2021  Post procedure Call Back phone  # [phone]  Permission to leave phone message Yes  Some recent data might be hidden     Patient questions:  Do you have a fever, pain , or abdominal swelling? Yes Pain Score  9 *  Have you tolerated food without any problems? Yes.    Have you been able to return to your normal activities? Yes.    Do you have any questions about your discharge instructions: Diet   No. Medications  No. Follow up visit  No.  Do you have questions or concerns about your Care? Yes.   C/O chest pain which she states started the day after the procedure.  States it is intermittent and rates it 8-9, denies SOB.  RN instructed patient to call her Cardiologist or present to ED for evaluation of chest pain.  She verbalizes understanding.  Dr Rhea Belton, please advise if you have any add'l recommendations for patient.  Thank you   Actions: * If pain score is 4 or above: Physician/ provider Notified : Erick Blinks, MD.  Have you developed a fever since your procedure? no  2.   Have you had an respiratory symptoms (SOB or cough) since your procedure? no  3.   Have you tested positive for COVID 19 since your procedure no  4.   Have you had any family members/close contacts diagnosed with the COVID 19 since your procedure?  no   If yes to any of these questions please route to Laverna Peace, RN and Karlton Lemon, RN

## 2021-10-05 NOTE — Telephone Encounter (Signed)
LMOM to call office back.

## 2021-10-06 ENCOUNTER — Telehealth: Payer: Self-pay | Admitting: Cardiology

## 2021-10-06 ENCOUNTER — Encounter: Payer: Self-pay | Admitting: Internal Medicine

## 2021-10-06 NOTE — Telephone Encounter (Signed)
Patient scheduled for f/u appt post heart cath with you 10/28/2021 at 1:00 PM.

## 2021-10-11 ENCOUNTER — Other Ambulatory Visit: Payer: Self-pay

## 2021-10-11 ENCOUNTER — Encounter: Payer: Self-pay | Admitting: Registered Nurse

## 2021-10-11 ENCOUNTER — Encounter: Payer: Medicaid Other | Attending: Physical Medicine and Rehabilitation | Admitting: Registered Nurse

## 2021-10-11 VITALS — BP 155/92 | HR 102 | Temp 99.2°F | Ht 64.0 in | Wt 269.0 lb

## 2021-10-11 DIAGNOSIS — Z79899 Other long term (current) drug therapy: Secondary | ICD-10-CM

## 2021-10-11 DIAGNOSIS — G629 Polyneuropathy, unspecified: Secondary | ICD-10-CM

## 2021-10-11 DIAGNOSIS — M797 Fibromyalgia: Secondary | ICD-10-CM

## 2021-10-11 DIAGNOSIS — G894 Chronic pain syndrome: Secondary | ICD-10-CM | POA: Diagnosis not present

## 2021-10-11 DIAGNOSIS — M170 Bilateral primary osteoarthritis of knee: Secondary | ICD-10-CM

## 2021-10-11 DIAGNOSIS — Z5181 Encounter for therapeutic drug level monitoring: Secondary | ICD-10-CM | POA: Diagnosis not present

## 2021-10-11 DIAGNOSIS — M542 Cervicalgia: Secondary | ICD-10-CM

## 2021-10-11 DIAGNOSIS — M5416 Radiculopathy, lumbar region: Secondary | ICD-10-CM

## 2021-10-11 DIAGNOSIS — R000 Tachycardia, unspecified: Secondary | ICD-10-CM | POA: Diagnosis not present

## 2021-10-11 MED ORDER — HYDROCODONE-ACETAMINOPHEN 10-325 MG PO TABS: 1.0000 | ORAL_TABLET | Freq: Three times a day (TID) | ORAL | 0 refills | Status: DC | PRN

## 2021-10-11 NOTE — Progress Notes (Signed)
Subjective:    Patient ID: Jill Shaw, female    DOB: [birth year], 45 y.o.   MRN: [id number]  HPI: Jill Shaw is a 45 y.o. female who returns for follow up appointment for chronic pain and medication refill. She states her pain is located in her . Neck, lower back pain radiating  into her bilateral hips and bilateral lower extremities. Also reports bilateral knee pain  and bilateral feet pain with tingling and numbness. She rates her pain 10. Her current exercise regime is walking and performing stretching exercises.  Jill Shaw Morphine equivalent is 30.00 MME.   Last Oral Swab was Performed on 08/01/2021, it was consistent.      Pain Inventory Average Pain 10 Pain Right Now 10 My pain is constant, sharp, burning, dull, stabbing, tingling, and aching  In the last 24 hours, has pain interfered with the following? General activity 10 Relation with others 10 Enjoyment of life 10 What TIME of day is your pain at its worst? morning , daytime, evening, and night Sleep (in general) Poor  Pain is worse with: walking, bending, sitting, inactivity, standing, and some activites Pain improves with:  Relief from Meds: heat/ice and medication10  Family History  Problem Relation Age of Onset   Diabetes Mother    Hypertension Mother    Migraines Paternal Grandfather    Colon cancer Neg Hx    Esophageal cancer Neg Hx    Stomach cancer Neg Hx    Rectal cancer Neg Hx    Social History   Socioeconomic History   Marital status: Legally Separated    Spouse name: Not on file   Number of children: 3   Years of education: Not on file   Highest education level: Not on file  Occupational History   Occupation: unemployed  Tobacco Use   Smoking status: Some Days    Packs/day: 0.25    Years: 26.00    Pack years: 6.50    Types: Cigarettes    Last attempt to quit: 09/13/2020    Years since quitting: 1.0   Smokeless tobacco: Never   Tobacco comments:    4-5 cigarettes/day  Vaping  Use   Vaping Use: Never used  Substance and Sexual Activity   Alcohol use: No   Drug use: No   Sexual activity: Not Currently    Birth control/protection: None  Other Topics Concern   Not on file  Social History Narrative   Right Handed   Lives in a one story apartment, but lives on the second floor   Drinks caffeine once in awhile   Social Determinants of Health   Financial Resource Strain: Not on file  Food Insecurity: No Food Insecurity   Worried About Programme researcher, broadcasting/film/video in the Last Year: Never true   Ran Out of Food in the Last Year: Never true  Transportation Needs: No Transportation Needs   Lack of Transportation (Medical): No   Lack of Transportation (Non-Medical): No  Physical Activity: Not on file  Stress: Not on file  Social Connections: Not on file   Past Surgical History:  Procedure Laterality Date   CESAREAN SECTION     x 1. for twins   DILATION AND CURETTAGE OF UTERUS N/A 08/20/2019   Procedure: DILATATION AND CURETTAGE;  Surgeon: Allie Bossier, MD;  Location: [facility] OR;  Service: Gynecology;  Laterality: N/A;   ENDOMETRIAL ABLATION N/A 08/20/2019   Procedure: Minerva Ablation;  Surgeon: Allie Bossier, MD;  Location: [facility]  OR;  Service: Gynecology;  Laterality: N/A;   EYE SURGERY Bilateral    laser right and cataract removed left eye   LEFT HEART CATH AND CORONARY ANGIOGRAPHY N/A 08/30/2021   Procedure: LEFT HEART CATH AND CORONARY ANGIOGRAPHY;  Surgeon: Elder Negus, MD;  Location: [facility] INVASIVE CV LAB;  Service: Cardiovascular;  Laterality: N/A;   RADIOLOGY WITH ANESTHESIA N/A 09/16/2019   Procedure: MRI WITH ANESTHESIA   L SPINE WITHOUT CONTRAST, T SPINE WITHOUT CONTRAST , CERVICAL WITHOUT CONTRAST;  Surgeon: Radiologist, Medication, MD;  Location: [facility] OR;  Service: Radiology;  Laterality: N/A;   TUBAL LIGATION     interval BTL   UPPER GI ENDOSCOPY  07/2017   Past Surgical History:  Procedure Laterality Date   CESAREAN SECTION     x 1. for twins   DILATION  AND CURETTAGE OF UTERUS N/A 08/20/2019   Procedure: DILATATION AND CURETTAGE;  Surgeon: Allie Bossier, MD;  Location: [facility] OR;  Service: Gynecology;  Laterality: N/A;   ENDOMETRIAL ABLATION N/A 08/20/2019   Procedure: Minerva Ablation;  Surgeon: Allie Bossier, MD;  Location: [facility] OR;  Service: Gynecology;  Laterality: N/A;   EYE SURGERY Bilateral    laser right and cataract removed left eye   LEFT HEART CATH AND CORONARY ANGIOGRAPHY N/A 08/30/2021   Procedure: LEFT HEART CATH AND CORONARY ANGIOGRAPHY;  Surgeon: Elder Negus, MD;  Location: [facility] INVAS[facility] CV [facility];  Service: Cardiovascular;  Laterality: N/A;   RADIOLOGY WITH ANESTHESIA N/A 09/16/2019   Procedure: MRI WITH ANESTHESIA   L SPINE WITHOUT CONTRAST, T SPINE WITHOUT CONTRAST , CERVICAL WITHOUT CONTRAST;  Surgeon: Radiologist, Medication, MD;  Location: [facility] OR;  Service: Radiology;  Laterality: N/A;   TUBAL LIGATION     interval BTL   UPPER GI ENDOSCOPY  07/2017   Past Medical History:  Diagnosis Date   Anemia    Arthritis    knees, hands   Asthma    Chronic diastolic (congestive) heart failure (HCC)    COPD (chronic obstructive pulmonary disease) (HCC)    Diabetes mellitus without complication (HCC)    type 2   DKA (diabetic ketoacidosis) (HCC) 10/15/2020   Dysfunctional uterine bleeding    GERD (gastroesophageal reflux disease)    Hypertension    Neuromuscular disorder (HCC)    neuropathy feet   Seizures (HCC) 09/12/2017   pt states r/t stress and blood sugar - no meds last one 4 months ago, not seen neurologist   Sickle cell trait (HCC)    Smoker    Vitamin D deficiency 10/2019   Wears glasses    BP (!) 155/92    Pulse (!) 108    Temp 99.2 F (37.3 C)    Ht 5\' 4"  (1.626 m)    Wt 269 lb (122 kg)    LMP  (LMP Unknown)    SpO2 97%    BMI 46.17 kg/m   Opioid Risk Score:   Fall Risk Score:  `1  Depression screen PHQ 2/9  Depression screen [facility] 2/9 09/30/2021 08/01/2021 07/29/2021 06/14/2021 04/15/2021 03/30/2021 03/22/2021   Decreased Interest 0 1 2 0 2 0 1  Down, Depressed, Hopeless 0 1 2 0 2 0 1  PHQ - 2 Score 0 2 4 0 4 0 2  Altered sleeping - - 3 - 3 - -  Tired, decreased energy - - 2 - 3 - -  Change in appetite - - 0 - 0 - -  Feeling bad or failure about yourself  - - - -  0 - -  Trouble concentrating - - 2 - 2 - -  Moving slowly or fidgety/restless - - 0 - 0 - -  Suicidal thoughts - - 0 - 0 - -  PHQ-9 Score - - 11 - 12 - -  Difficult doing work/chores - - - - - - -  Some recent data might be hidden    Review of Systems  Musculoskeletal:  Positive for arthralgias, back pain and gait problem.       Pain in all joints  All other systems reviewed and are negative.     Objective:   Physical Exam Vitals and nursing note reviewed.  Constitutional:      Appearance: Normal appearance.  Cardiovascular:     Rate and Rhythm: Regular rhythm. Tachycardia present.     Pulses: Normal pulses.     Heart sounds: Normal heart sounds.  Pulmonary:     Effort: Pulmonary effort is normal.     Breath sounds: Normal breath sounds.  Musculoskeletal:     Cervical back: Normal range of motion and neck supple.     Comments: Normal Muscle Bulk and Muscle Testing Reveals:  Upper Extremities: Full ROM and Muscle Strength 4/5 Bilateral AC Joint Tenderness  Lumbar Hypersensitivity Left Greater Trochanter Tenderness Lower Extremities: Decreased ROM and Muscle Strength 5/5 Bilateral Lower Extremities: Bilateral Lower Extremities Flexion Produces Pain into her Bilateral Patellas Arises from Table Slowly Narrow Based  Gait     Skin:    General: Skin is warm and dry.  Neurological:     Mental Status: She is alert and oriented to person, place, and time.  Psychiatric:        Mood and Affect: Mood normal.        Behavior: Behavior normal.         Assessment & Plan:  1. Lumbar Radiculitis: Continue current medication regimen. Continue HEP as Tolerated.10/11/2021 2. Fibromyalgia/Polyneuropathy: Continue HEP as  Tolerated. Continue current Medication regimen. Continue to Monitor. 10/11/2021 3. Bilateral Knee Pain: No complaints today. Continue to Monitor.10/11/2021 4. Chronic Pain Syndrome: Continue Hydrocodone 10/325mg  one tablet twice a day as needed for pain #60. We will continue the opioid monitoring program, this consists of regular clinic visits, examinations, urine drug screen, pill counts as well as use of [place] Controlled Substance Reporting system. A 12 month History has been reviewed on the [place] Controlled Substance Reporting System on 10/11/2021.  5. Tachycardia: Apical pulse check. Jill Shaw will F/U with her PCP and Cardiologist. Continue to monitor.  10/11/2021  F/U in 1 month

## 2021-10-13 ENCOUNTER — Telehealth: Payer: Self-pay | Admitting: Gastroenterology

## 2021-10-13 ENCOUNTER — Telehealth: Payer: Self-pay | Admitting: Internal Medicine

## 2021-10-13 NOTE — Telephone Encounter (Signed)
Left message for pt to call back. Attempted to call patient's pharmacy but was unable to get through.

## 2021-10-13 NOTE — Telephone Encounter (Signed)
Patient called to follow-up on phone call earlier today. We discussed that she will increase her Dexilant to BID. Additional medication recommendations per Dr. Elesa Hacker note from earlier today. I also asked her to schedule her ultrasound.  She will call Lauren tomorrow to review her prescriptions and make a follow-up appointment with Dr. Rhea Belton.

## 2021-10-13 NOTE — Telephone Encounter (Signed)
Patient called requesting to speak with her doctor's nurse.  Dr. Rhea Belton did colonoscopy on 12/19.  Patient needs to ask about a medication as well as other issues.  Please call.  Thank you.

## 2021-10-13 NOTE — Telephone Encounter (Signed)
Returned patient's call. Pt said she is still having nausea and abdominal pain. Pt said she is currently taking zofran for the nausea and hydrocodone for the pain. She states that sometimes the pain is not controlled with the hydrocodone and she has to go to the ED. Asked patient if she was still taking the Dexilant and carafate and pt confirmed she was taking medication as prescribed. Pt wanted to know what else could be done for her symptoms. Dr. Lauro Franklin patients, as DOD please advise.

## 2021-10-13 NOTE — Telephone Encounter (Addendum)
Patient history reviewed.  Longstanding chronic abdominal pain issues for the last few years.  Negative work-up including endoscopy and negative imaging within the last month and a half.  I would recommend trying to increase the Dexilant to 30 mg twice daily for a week.  I would send a GI cocktail that she may use twice daily (14 total doses).  She can update Dr. Rhea Belton next week.  If the pain becomes so severe and she cannot tolerate it, she will need to go to the emergency department.  If there are further issues on Friday, we will need to discuss with doc of the day (I am away).  Thanks. GM

## 2021-10-14 ENCOUNTER — Other Ambulatory Visit: Payer: Self-pay | Admitting: Gastroenterology

## 2021-10-14 ENCOUNTER — Other Ambulatory Visit: Payer: Self-pay

## 2021-10-14 MED ORDER — PROMETHAZINE HCL 25 MG PO TABS: 25.0000 mg | ORAL_TABLET | Freq: Three times a day (TID) | ORAL | 0 refills | Status: DC | PRN

## 2021-10-14 NOTE — Telephone Encounter (Signed)
I discontinued this patient's prescription for ondansetron and sent a prescription for Phenergan.  All other chronic issues to be addressed by Dr. Rhea Belton when he returns to the office next week.  - HD

## 2021-10-14 NOTE — Telephone Encounter (Signed)
Called and spoke with patient. Gave pt Dr. Elesa Hacker recommendations. Told pt we were unable to get through to [facility] to call in GI cocktail. Pt said it would be ok to use [facility] [address] Pt said that she is still having a lot of nausea and that zofran doesn't work for her. Pt requested that doctor prescribe phenergan for her because pt states this is the only thing that works for her and she is currently out of this medication. Pt also requested a work note for her to be out of work completely. Asked pt how long that would be and pt stated that she can't work because of the pain.     Dr. Myrtie Neither as DOD please advise.

## 2021-10-14 NOTE — Addendum Note (Signed)
Addended by: Charlie Pitter on: 10/14/2021 04:26 PM   Modules accepted: Orders

## 2021-10-14 NOTE — Telephone Encounter (Signed)
Called pt to let her know phenergan was sent to pharmacy. Scheduled pt for a follow up appt on 11/09/21 at 2:30 pm with Dr. Rhea Belton per Dr. Orvan Falconer note on 12/29. Pt verbalized understanding and had no other concerns at end of call.

## 2021-10-16 LAB — HORMONE PANEL (T4,TSH,FSH,TESTT,SHBG,DHEA,ETC)
DHEA-Sulfate, LCMS: 156 ug/dL
Estradiol, Serum, MS: 7.5 pg/mL
Estrone Sulfate: 10 ng/dL
Follicle Stimulating Hormone: 5.9 m[IU]/mL
Free T-3: 3.3 pg/mL
Free Testosterone, Serum: 2.7 pg/mL
Progesterone, Serum: <10 ng/dL
Sex Hormone Binding Globulin: 35 nmol/L
T4: 7.3 ug/dL
TSH: 0.99 uU/mL
Testosterone, Serum (Total): 18 ng/dL
Testosterone-% Free: 1.5 %
Triiodothyronine (T-3), Serum: 100 ng/dL

## 2021-10-17 ENCOUNTER — Encounter: Payer: Self-pay | Admitting: Registered Nurse

## 2021-10-18 ENCOUNTER — Encounter: Payer: Self-pay | Admitting: Orthopedic Surgery

## 2021-10-18 ENCOUNTER — Ambulatory Visit (INDEPENDENT_AMBULATORY_CARE_PROVIDER_SITE_OTHER): Payer: Medicaid Other

## 2021-10-18 ENCOUNTER — Other Ambulatory Visit: Payer: Self-pay | Admitting: Cardiology

## 2021-10-18 ENCOUNTER — Other Ambulatory Visit: Payer: Self-pay

## 2021-10-18 ENCOUNTER — Ambulatory Visit (INDEPENDENT_AMBULATORY_CARE_PROVIDER_SITE_OTHER): Payer: Medicaid Other | Admitting: Orthopedic Surgery

## 2021-10-18 VITALS — BP 144/84 | HR 90

## 2021-10-18 DIAGNOSIS — M79644 Pain in right finger(s): Secondary | ICD-10-CM

## 2021-10-18 DIAGNOSIS — G8929 Other chronic pain: Secondary | ICD-10-CM

## 2021-10-18 DIAGNOSIS — M65311 Trigger thumb, right thumb: Secondary | ICD-10-CM | POA: Insufficient documentation

## 2021-10-18 DIAGNOSIS — I10 Essential (primary) hypertension: Secondary | ICD-10-CM
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
## 2021-10-24 ENCOUNTER — Other Ambulatory Visit: Payer: Self-pay | Admitting: Nurse Practitioner

## 2021-10-24 DIAGNOSIS — M62838 Other muscle spasm: Secondary | ICD-10-CM

## 2021-10-24 MED ORDER — AMBULATORY NON FORMULARY MEDICATION: 0 refills | Status: DC

## 2021-10-24 NOTE — Addendum Note (Signed)
Addended by: Garen Lah A on: 10/24/2021 01:27 PM   Modules accepted: Orders

## 2021-10-24 NOTE — Telephone Encounter (Signed)
Spoke with Dorene Grebe at [employer] on Jill Shaw and she said that medication will not be prepared until pt either calls or comes by pharmacy because it's only good for 15 days. Called pt to let her know she will need to call pharmacy and let them know she will be picking up med today or stop by and they will prepare medication. Pt verbalized understanding and had no other concerns at end of call.

## 2021-10-24 NOTE — Telephone Encounter (Signed)
Patient called to follow up on previous messages please call her back to go over medications. She is still having abdominal pain and was waiting on a script for a cocktail.

## 2021-10-25 ENCOUNTER — Encounter: Payer: Self-pay | Admitting: Student

## 2021-10-26 ENCOUNTER — Ambulatory Visit: Payer: Medicaid Other | Admitting: Internal Medicine

## 2021-10-26 NOTE — Progress Notes (Deleted)
Name: Jill Shaw  Age/ Sex: 46 y.o., female   MRN/ DOB: [id number], April 05, 1976     PCP: Vevelyn Francois, NP   Reason for Endocrinology Evaluation: Type 2 Diabetes Mellitus  Initial Endocrine Consultative Visit: 02/16/2021    PATIENT IDENTIFIER: Jill Shaw is a 46 y.o. female with a past medical history of T2DM, OSA, HTN , HFpEF and sickle cell trait. The patient has followed with Endocrinology clinic since 02/16/2021 for consultative assistance with management of her diabetes.  DIABETIC HISTORY:  Ms. Mctague was diagnosed with DM in 2008, she has been on liraglutide in the past. Her hemoglobin A1c has ranged from 7.1% in 2019, peaking at 9.5% in 2021.   On her initial visit to our clinic her A1c was 10.7%, she was on Onglyza and Humalog mix.  We started [place], continued Onglyza, and switch insulin mix to basal/prandial    She was screened for cushingoid features but was noted to have urinary free cortisol was normal at 10.4 MCG  (02/22/2021) SUBJECTIVE:   During the last visit (06/27/2021): A1c 9.5%We started Farxiga, continued Onglyza, and switch insulin mix to basal/prandial  Today (10/26/2021): [name] is here for follow-up on diabetes management.  She checks her blood sugars 2-3 times daily, she did not bring meter. The patient has had hypoglycemic episodes once since her last visit here.    She was recently seen by her PCP for abdominal pain, lipase normal. Has GI appointment next month . Has nausea and diarrhea.  Follows with pain management    HOME DIABETES REGIMEN:  Jardiance 25 mg daily  Onglyza 5 mg daily Lantus 110 units daily Humalog 50 units 3 times daily before every meal CF: Humalog (BG-170/10)     Statin: Yes ACE-I/ARB: Yes Prior Diabetic Education: Yes   METER DOWNLOAD SUMMARY: Did not bring     DIABETIC COMPLICATIONS: Microvascular complications:  Neuropathy, left eye retinopathy Denies: CKD Last Eye Exam: Completed  04/2021  Macrovascular complications:   Denies: CAD, CVA, PVD   HISTORY:  Past Medical History:  Past Medical History:  Diagnosis Date   Anemia    Arthritis    knees, hands   Asthma    Chronic diastolic (congestive) heart failure (HCC)    COPD (chronic obstructive pulmonary disease) ([place])    Diabetes mellitus without complication ([place])    type 2   DKA (diabetic ketoacidosis) ([place]) 10/15/2020   Dysfunctional uterine bleeding    GERD (gastroesophageal reflux disease)    Hypertension    Neuromuscular disorder (HCC)    neuropathy feet   Seizures ([place]) 09/12/2017   pt states r/t stress and blood sugar - no meds last one 4 months ago, not seen neurologist   Sickle cell trait ([place])    Smoker    Vitamin D deficiency 10/2019   Wears glasses    Past Surgical History:  Past Surgical History:  Procedure Laterality Date   CESAREAN SECTION     x 1. for twins   DILATION AND CURETTAGE OF UTERUS N/A 08/20/2019   Procedure: DILATATION AND CURETTAGE;  Surgeon: Emily Filbert, MD;  Location: [facility] [place];  Service: Gynecology;  Laterality: N/A;   ENDOMETRIAL ABLATION N/A 08/20/2019   Procedure: Minerva Ablation;  Surgeon: Emily Filbert, MD;  Location: [place];  Service: Gynecology;  Laterality: N/A;   EYE SURGERY Bilateral    laser right and cataract removed left eye   LEFT HEART CATH AND CORONARY ANGIOGRAPHY N/A 08/30/2021   Procedure: LEFT  HEART CATH AND CORONARY ANGIOGRAPHY;  Surgeon: Nigel Mormon, MD;  Location: [place] [facility];  Service: Cardiovascular;  Laterality: N/A;   RADIOLOGY WITH ANESTHESIA N/A 09/16/2019   Procedure: MRI WITH ANESTHESIA   L SPINE WITHOUT CONTRAST, T SPINE WITHOUT CONTRAST , CERVICAL WITHOUT CONTRAST;  Surgeon: Radiologist, Medication, MD;  Location: [name] [facility];  Service: Radiology;  Laterality: N/A;   TUBAL LIGATION     interval BTL   UPPER GI ENDOSCOPY  07/2017   Social History:  reports that she has been smoking cigarettes. She has a 6.50 pack-year smoking  history. She has never used smokeless tobacco. She reports that she does not drink alcohol and does not use drugs. Family History:  Family History  Problem Relation Age of Onset   Diabetes Mother    Hypertension Mother    Migraines Paternal Grandfather    Colon cancer Neg Hx    Esophageal cancer Neg Hx    Stomach cancer Neg Hx    Rectal cancer Neg Hx      HOME MEDICATIONS: Allergies as of 10/26/2021       Reactions   Amitriptyline Other (See Comments)   Coma   Ketoprofen Nausea And Vomiting   Trazodone Nausea And Vomiting   Aspirin Nausea Only   Gabapentin Nausea And Vomiting, Other (See Comments)   upset stomach   Ibuprofen Nausea And Vomiting   Liraglutide Nausea And Vomiting   Naproxen Nausea And Vomiting   Omeprazole-sodium Bicarbonate Nausea And Vomiting   Sulfa Antibiotics Nausea And Vomiting   Tramadol Nausea And Vomiting, Other (See Comments)   stomach upset        Medication List        Accurate as of October 26, 2021  7:43 AM. If you have any questions, ask your nurse or doctor.          Accu-Chek Guide test strip Generic drug: glucose blood USE AS DIRECTED UP TO FOUR TIMES DAILY   Accu-Chek Softclix Lancets lancets USE AS DIRECTED UP TO FOUR TIMES DAILY   Adult Blood Pressure Cuff Lg Kit 1 kit by Does not apply route daily.   albuterol (2.5 MG/3ML) 0.083% nebulizer solution Commonly known as: PROVENTIL Take 3 mLs (2.5 mg total) by nebulization every 6 (six) hours as needed for wheezing or shortness of breath.   albuterol 108 (90 Base) MCG/ACT inhaler Commonly known as: VENTOLIN HFA Inhale 2 puffs into the lungs every 6 (six) hours as needed for wheezing or shortness of breath.   AMBULATORY NON FORMULARY MEDICATION Medication Name: GI cocktail (viscous lidocaine, dicyclomine, maalox) Swish and swallow 30 mL 2x per day   aspirin EC 81 MG tablet Take 1 tablet (81 mg total) by mouth daily.   blood glucose meter kit and supplies Kit 1 each  by Other route See admin instructions. Dispense based on patient and insurance preference. Use up to four times daily as directed. (FOR ICD-9 250.00, 250.01).   budesonide-formoterol 160-4.5 MCG/ACT inhaler Commonly known as: Symbicort INHALE 2 PUFFS INTO THE LUNGS 2 (TWO) TIMES DAILY.   cetirizine 10 MG tablet Commonly known as: ZYRTEC Take 1 tablet (10 mg total) by mouth daily.   Dexcom G6 Sensor Misc 1 Device by Does not apply route as directed.   Dexcom G6 Transmitter Misc 1 Device by Does not apply route as directed.   Dexlansoprazole 30 MG capsule DR Commonly known as: Dexilant Take 1 capsule (30 mg total) by mouth daily.   empagliflozin 25 MG Tabs  tablet Commonly known as: Jardiance Take 1 tablet (25 mg total) by mouth daily before breakfast.   ferrous sulfate 325 (65 FE) MG tablet Take 1 tablet (325 mg total) by mouth 3 (three) times daily with meals.   fluticasone 50 MCG/ACT nasal spray Commonly known as: FLONASE Place 2 sprays into both nostrils daily.   furosemide 40 MG tablet Commonly known as: LASIX TAKE 1 TABLET($RemoveBefor'40MG'bwmoQRGoexLY$  TOTAL) BY MOUTH TWICE DAILY   Glucosamine Sulfate 1000 MG Caps Take 1 capsule (1,000 mg total) by mouth 2 (two) times daily.   haloperidol 5 MG tablet Commonly known as: HALDOL Take 0.5-1 tablets (2.5-5 mg total) by mouth every 8 (eight) hours as needed (For nausea/vomiting).   Heating Pad Moist/Dry King Sz Pads 1 each by Does not apply route 4 (four) times daily as needed.   HumaLOG KwikPen 200 UNIT/ML KwikPen Generic drug: insulin lispro Max daily 180 units   hydrochlorothiazide 12.5 MG capsule Commonly known as: MICROZIDE TAKE 1 CAPSULE(12.5 MG) BY MOUTH DAILY   HYDROcodone-acetaminophen 10-325 MG tablet Commonly known as: NORCO Take 1 tablet by mouth 3 (three) times daily as needed. Do Not Fill Before 10/20/2021   hydrocortisone 2.5 % cream Apply topically 2 (two) times daily. What changed: how much to take   hydroquinone 4 %  cream APPLY TOPICALLY TWICE DAILY What changed:  how much to take when to take this reasons to take this   hydrOXYzine 25 MG tablet Commonly known as: ATARAX Take 1 tablet (25 mg total) by mouth every 8 (eight) hours as needed. Anxiety   hydrOXYzine 50 MG capsule Commonly known as: VISTARIL Take 1 capsule (50 mg total) by mouth 3 (three) times daily.   Insulin Pen Needle 31G X 8 MM Misc 1 Device by Does not apply route in the morning, at noon, in the evening, and at bedtime.   isosorbide mononitrate 30 MG 24 hr tablet Commonly known as: IMDUR Take 1 tablet (30 mg total) by mouth daily.   Lantus SoloStar 100 UNIT/ML Solostar Pen Generic drug: insulin glargine Inject 110 Units into the skin daily.   losartan 100 MG tablet Commonly known as: COZAAR TAKE 1 TABLET BY MOUTH EVERY DAY   metoprolol succinate 100 MG 24 hr tablet Commonly known as: TOPROL-XL Take 100 mg by mouth daily. Take with or immediately following a meal.   norethindrone 5 MG tablet Commonly known as: Aygestin Take 2 tablets (10 mg total) by mouth 2 (two) times daily as needed.   potassium chloride SA 20 MEQ tablet Commonly known as: KLOR-CON M TAKE 1 TABLET(20 MEQ) BY MOUTH DAILY What changed: See the new instructions.   promethazine 25 MG tablet Commonly known as: PHENERGAN Take 1 tablet (25 mg total) by mouth every 8 (eight) hours as needed for nausea or vomiting.   rosuvastatin 20 MG tablet Commonly known as: Crestor Take 1 tablet (20 mg total) by mouth daily.   saxagliptin HCl 5 MG Tabs tablet Commonly known as: Onglyza Take 1 tablet (5 mg total) by mouth daily.   senna-docusate 8.6-50 MG tablet Commonly known as: Senokot-S Take 1 tablet by mouth at bedtime as needed for mild constipation.   Spiriva Respimat 2.5 MCG/ACT Aers Generic drug: Tiotropium Bromide Monohydrate Inhale 2 puffs into the lungs daily.   spironolactone 50 MG tablet Commonly known as: ALDACTONE TAKE 1 TABLET(50 MG)  BY MOUTH DAILY What changed: See the new instructions.   sucralfate 1 g tablet Commonly known as: Carafate Take 1 tablet (1 g  total) by mouth 4 (four) times daily -  with meals and at bedtime.   tiZANidine 4 MG tablet Commonly known as: Zanaflex Take 1.5 tablets (6 mg total) by mouth every 6 (six) hours as needed for muscle spasms.   TobraDex ophthalmic solution Generic drug: tobramycin-dexamethasone Place 1 drop into the right eye 4 (four) times daily.   trifluoperazine 1 MG tablet Commonly known as: STELAZINE Take by mouth.   vitamin E 180 MG (400 UNITS) capsule Commonly known as: vitamin E Take 1 capsule (400 Units total) by mouth daily.         OBJECTIVE:   Vital Signs: There were no vitals taken for this visit.  Wt Readings from Last 3 Encounters:  10/11/21 269 lb (122 kg)  10/03/21 267 lb (121.1 kg)  09/30/21 270 lb (122.5 kg)     Exam: General: Pt appears well and is in NAD  Lungs: Clear with good BS bilat with no rales, rhonchi, or wheezes  Heart: RRR with normal S1 and S2 and no gallops; no murmurs; no rub  Extremities: No pretibial edema.   Neuro: MS is good with appropriate affect, pt is alert and Ox3        DM foot exam: 02/16/2021   The skin of the feet is intact without sores or ulcerations. The pedal pulses are 2+ on right and 2+ on left. The sensation is decreased to a screening 5.07, 10 gram monofilament bilaterally    DATA REVIEWED:  Lab Results  Component Value Date   HGBA1C 7.9 (A) 09/30/2021   HGBA1C 7.9 09/30/2021   HGBA1C 7.9 (A) 09/30/2021   HGBA1C 7.9 (A) 09/30/2021   Lab Results  Component Value Date   LDLCALC 48 07/29/2021   CREATININE 1.10 09/21/2021      Lab Results  Component Value Date   CHOL 125 07/29/2021   HDL 39 (L) 07/29/2021   LDLCALC 48 07/29/2021   TRIG 238 (H) 07/29/2021   CHOLHDL 3.2 07/29/2021        Results for CADINCE, HILSCHER (MRN [id number]) as of 06/27/2021 10:25  Ref. Range 06/22/2021 03:12   Sodium Latest Ref Range: 135 - 145 mmol/L 140  Potassium Latest Ref Range: 3.5 - 5.1 mmol/L 4.1  Chloride Latest Ref Range: 98 - 111 mmol/L 108  CO2 Latest Ref Range: 22 - 32 mmol/L 23  Glucose Latest Ref Range: 70 - 99 mg/dL 181 (H)  BUN Latest Ref Range: 6 - 20 mg/dL 11  Creatinine Latest Ref Range: 0.44 - 1.00 mg/dL 0.93  Calcium Latest Ref Range: 8.9 - 10.3 mg/dL 9.9  Anion gap Latest Ref Range: 5 - 15  9  Alkaline Phosphatase Latest Ref Range: 38 - 126 U/L 95  Albumin Latest Ref Range: 3.5 - 5.0 g/dL 3.6  Lipase Latest Ref Range: 11 - 51 U/L 31  AST Latest Ref Range: 15 - 41 U/L 31  ALT Latest Ref Range: 0 - 44 U/L 15  Total Protein Latest Ref Range: 6.5 - 8.1 g/dL 6.7  Total Bilirubin Latest Ref Range: 0.3 - 1.2 mg/dL 0.5  GFR, Estimated Latest Ref Range: >60 mL/min >60   ASSESSMENT / PLAN / RECOMMENDATIONS:   1) Type 2 Diabetes Mellitus, Poorly controlled, With neuropathic and retinopathic complications - Most recent A1c of 9.5 %. Goal A1c <7.0%.     - Improved glycemic control  - No glucose data today, will prescribe dexcom  -She does not endorse 1 episode of hypoglycemia that was around 9 PM,  she has self increased prandial insulin, I am going to increase her Jardiance and preemptively reduce prandial insulin -Her in office BG 129 mg/DL -Cushing syndrome have been ruled out with a normal 24-hour urine collection -I am going to add correction scale so she can adjust her prandial dose accordingly -Due to her recurrent GI issues I would not switch her DPP 4 inhibitor to GLP-1 agonist as of yet  MEDICATIONS: Increase Jardiance to 25 mg daily Continue Onglyza 5 mg daily Continue Lantus 110 units daily Decrease Humalog to 50 units with each meal Correction scale: BG negative   EDUCATION / INSTRUCTIONS: BG monitoring instructions: Patient is instructed to check her blood sugars 3 times a day, before meals. Call [facility] if: BG persistently < 70  I  reviewed the Rule of 15 for the treatment of hypoglycemia in detail with the patient. Literature supplied.   2) Diabetic complications:  Eye: Does  have known diabetic retinopathy.  Neuro/ Feet: Does  have known diabetic peripheral neuropathy .  Renal: Patient does not have known baseline CKD. She   is  on an ACEI/ARB at present.       F/U in 4 months    Signed electronically by: Mack Guise, MD  [facility]  [facility] [address] [place], [place] [address] Phone: [phone] FAX: [phone]   CC: Vevelyn Francois, NP [address] [place] [address] Phone: [phone]  Fax: [phone]  Return to Endocrinology clinic as below: Future Appointments  Date Time Provider [place]  10/26/2021 10:50 AM Mahdiya Mossberg, Melanie Crazier, MD [facility] None  10/28/2021  1:00 PM Nigel Mormon, MD PCV-PCV None  10/31/2021  9:15 AM Sherilyn Cooter, MD [facility] None  11/01/2021 10:00 AM MC-US 1 [facility]  11/07/2021 11:00 AM Bayard Hugger, NP CPR[facility]PRMA CPR  11/09/2021  2:30 PM Pyrtle, Lajuan Lines, MD [facility]  12/08/2021 10:15 AM Aletha Halim, MD [facility]  12/29/2021 11:00 AM Vevelyn Francois, NP [place] None  01/03/2022 10:40 AM Raulkar, Clide Deutscher, MD [facility] CPR

## 2021-10-28 ENCOUNTER — Ambulatory Visit: Payer: Medicaid Other | Admitting: Cardiology

## 2021-10-28 ENCOUNTER — Other Ambulatory Visit: Payer: Self-pay

## 2021-10-28 ENCOUNTER — Encounter: Payer: Self-pay | Admitting: Cardiology

## 2021-10-28 ENCOUNTER — Inpatient Hospital Stay: Payer: Medicaid Other

## 2021-10-28 VITALS — BP 166/81 | HR 99 | Temp 98.0°F | Resp 16 | Ht 64.0 in | Wt 277.0 lb

## 2021-10-28 DIAGNOSIS — R002 Palpitations: Secondary | ICD-10-CM

## 2021-10-28 DIAGNOSIS — I10 Essential (primary) hypertension: Secondary | ICD-10-CM

## 2021-10-28 NOTE — Progress Notes (Signed)
Patient referred by Vevelyn Francois, NP for congestive heart failure  Subjective:   Jill Shaw, female    DOB: [birth year], 46 y.o.   MRN: [id number]   Chief Complaint  Patient presents with   Chest Pain   Follow-up    cath     HPI  46 year old African-American female with hypertension, type 2 diabetes mellitus, HFpEF, moderate persistent asthma, tobacco dependence, morbid obesity, microcytic anemia, uterine dysfunction  Coronary angiogram showed no angiographically evident coronary artery disease. She reports palpitations on walking. She is very concerned about heart rate increasing on walking.    Current Outpatient Medications on File Prior to Visit  Medication Sig Dispense Refill   Accu-Chek Softclix Lancets lancets USE AS DIRECTED UP TO FOUR TIMES DAILY 100 each 3   albuterol (PROVENTIL) (2.5 MG/3ML) 0.083% nebulizer solution Take 3 mLs (2.5 mg total) by nebulization every 6 (six) hours as needed for wheezing or shortness of breath. 150 mL 6   albuterol (VENTOLIN HFA) 108 (90 Base) MCG/ACT inhaler Inhale 2 puffs into the lungs every 6 (six) hours as needed for wheezing or shortness of breath. 8 g 6   AMBULATORY NON FORMULARY MEDICATION Medication Name: GI cocktail (viscous lidocaine, dicyclomine, maalox) Swish and swallow 30 mL 2x per day 450 mL 0   aspirin EC 81 MG tablet Take 1 tablet (81 mg total) by mouth daily. 90 tablet 3   blood glucose meter kit and supplies KIT 1 each by Other route See admin instructions. Dispense based on patient and insurance preference. Use up to four times daily as directed. (FOR ICD-9 250.00, 250.01). 1 each 2   Blood Pressure Monitoring (ADULT BLOOD PRESSURE CUFF LG) KIT 1 kit by Does not apply route daily. 1 kit 0   budesonide-formoterol (SYMBICORT) 160-4.5 MCG/ACT inhaler INHALE 2 PUFFS INTO THE LUNGS 2 (TWO) TIMES DAILY. 10.2 g 6   cetirizine (ZYRTEC) 10 MG tablet Take 1 tablet (10 mg total) by mouth daily. 30 tablet 3   Continuous  Blood Gluc Sensor (DEXCOM G6 SENSOR) MISC 1 Device by Does not apply route as directed. 9 each 3   Continuous Blood Gluc Transmit (DEXCOM G6 TRANSMITTER) MISC 1 Device by Does not apply route as directed. 1 each 3   Dexlansoprazole (DEXILANT) 30 MG capsule Take 1 capsule (30 mg total) by mouth daily. 30 capsule 11   empagliflozin (JARDIANCE) 25 MG TABS tablet Take 1 tablet (25 mg total) by mouth daily before breakfast. 90 tablet 3   ferrous sulfate 325 (65 FE) MG tablet Take 1 tablet (325 mg total) by mouth 3 (three) times daily with meals. 270 tablet 3   fluticasone (FLONASE) 50 MCG/ACT nasal spray Place 2 sprays into both nostrils daily. 16 g 6   furosemide (LASIX) 40 MG tablet TAKE 1 TABLET(40MG TOTAL) BY MOUTH TWICE DAILY 180 tablet 1   Glucosamine Sulfate 1000 MG CAPS Take 1 capsule (1,000 mg total) by mouth 2 (two) times daily. (Patient taking differently: Take 1,000 mg by mouth 2 (two) times daily.) 180 capsule 3   glucose blood (ACCU-CHEK GUIDE) test strip USE AS DIRECTED UP TO FOUR TIMES DAILY 100 strip 4   haloperidol (HALDOL) 5 MG tablet Take 0.5-1 tablets (2.5-5 mg total) by mouth every 8 (eight) hours as needed (For nausea/vomiting). 15 tablet 0   Heating Pads (HEATING PAD MOIST/DRY KING SZ) PADS 1 each by Does not apply route 4 (four) times daily as needed. 120 each 1   hydrochlorothiazide (MICROZIDE)  12.5 MG capsule TAKE 1 CAPSULE(12.5 MG) BY MOUTH DAILY 30 capsule 3   HYDROcodone-acetaminophen (NORCO) 10-325 MG tablet Take 1 tablet by mouth 3 (three) times daily as needed. Do Not Fill Before 10/20/2021 90 tablet 0   hydrocortisone 2.5 % cream Apply topically 2 (two) times daily. (Patient taking differently: Apply 1 application topically 2 (two) times daily.) 30 g 11   hydroquinone 4 % cream APPLY TOPICALLY TWICE DAILY (Patient taking differently: Apply 1 application topically 2 (two) times daily as needed (pain).) 28.35 g 0   hydrOXYzine (ATARAX) 25 MG tablet Take 1 tablet (25 mg  total) by mouth every 8 (eight) hours as needed. Anxiety 60 tablet 0   hydrOXYzine (VISTARIL) 50 MG capsule Take 1 capsule (50 mg total) by mouth 3 (three) times daily. 90 capsule 5   insulin glargine (LANTUS SOLOSTAR) 100 UNIT/ML Solostar Pen Inject 110 Units into the skin daily. 105 mL 3   insulin lispro (HUMALOG KWIKPEN) 200 UNIT/ML KwikPen Max daily 180 units 90 mL 3   Insulin Pen Needle 31G X 8 MM MISC 1 Device by Does not apply route in the morning, at noon, in the evening, and at bedtime. 400 each 3   isosorbide mononitrate (IMDUR) 30 MG 24 hr tablet Take 1 tablet (30 mg total) by mouth daily. 90 tablet 3   losartan (COZAAR) 100 MG tablet TAKE 1 TABLET BY MOUTH EVERY DAY 30 tablet 3   metoprolol succinate (TOPROL-XL) 100 MG 24 hr tablet Take 100 mg by mouth daily. Take with or immediately following a meal.     norethindrone (AYGESTIN) 5 MG tablet Take 2 tablets (10 mg total) by mouth 2 (two) times daily as needed. 120 tablet 2   potassium chloride SA (KLOR-CON) 20 MEQ tablet TAKE 1 TABLET(20 MEQ) BY MOUTH DAILY (Patient taking differently: Take 20 mEq by mouth daily.) 30 tablet 6   promethazine (PHENERGAN) 25 MG tablet Take 1 tablet (25 mg total) by mouth every 8 (eight) hours as needed for nausea or vomiting. 20 tablet 0   rosuvastatin (CRESTOR) 20 MG tablet Take 1 tablet (20 mg total) by mouth daily. 30 tablet 3   saxagliptin HCl (ONGLYZA) 5 MG TABS tablet Take 1 tablet (5 mg total) by mouth daily. 90 tablet 3   senna-docusate (SENOKOT-S) 8.6-50 MG tablet Take 1 tablet by mouth at bedtime as needed for mild constipation. 30 tablet 0   spironolactone (ALDACTONE) 50 MG tablet TAKE 1 TABLET(50 MG) BY MOUTH DAILY (Patient taking differently: Take 50 mg by mouth daily.) 30 tablet 3   sucralfate (CARAFATE) 1 g tablet Take 1 tablet (1 g total) by mouth 4 (four) times daily -  with meals and at bedtime. 90 tablet 0   Tiotropium Bromide Monohydrate (SPIRIVA RESPIMAT) 2.5 MCG/ACT AERS Inhale 2 puffs  into the lungs daily. 4 g 6   tiZANidine (ZANAFLEX) 4 MG tablet Take 1.5 tablets (6 mg total) by mouth every 6 (six) hours as needed for muscle spasms. 120 tablet 3   TOBRADEX ophthalmic solution Place 1 drop into the right eye 4 (four) times daily.     trifluoperazine (STELAZINE) 1 MG tablet Take by mouth.     vitamin E (VITAMIN E) 180 MG (400 UNITS) capsule Take 1 capsule (400 Units total) by mouth daily. 30 capsule 1   No current facility-administered medications on file prior to visit.    Cardiovascular and other pertinent studies:  Coronary angiogram 08/30/2021: LM: Normal LAD: Minimal luminal irregularities Lcx: Normal  RCA: Normal Normal flow in all coronary beds   Normal LVEDP   Consider alternate cause for chest pain Recommend aggressive blood pressure control  Echocardiogram 06/01/2021:  Left ventricle cavity is normal in size. Mild concentric hypertrophy of  the left ventricle. Hyperdynamic LV systolic function >09%. Patient is  tachycardic throughout the day. Indeterminate diastolic filling pattern.  No significant valvular abnormality.  Normal right atrial pressure.  Lexiscan/midified Bruce Tetrofosmin stress test 05/23/2021: Lexiscan/modified Bruce nuclear stress test performed using 1-day protocol. SPECT images show very small sized, mild intensity, inferior apical myocardium with mild reversibility. Stress LVEF calculated 40-45%, although visually appears 50-55%. Low risk study.  EKG 10/14/2020: Sinus tachycardia 111 bpm  Cannot exclude old anteroseptal infarct  EKG 09/01/2020: Sinus rhythm 85 bpm  Poor R wave progression Otherwise normal EKG  Vascular US 07/16/2020: No DVT  Echocardiogram 04/30/2020: 1. Left ventricular ejection fraction, by estimation, is 55 to 60%. The  left ventricle has normal function. The left ventricle has no regional  wall motion abnormalities. There is moderate concentric left ventricular  hypertrophy. Left ventricular   diastolic parameters are consistent with Grade I diastolic dysfunction  (impaired relaxation). Elevated left ventricular end-diastolic pressure.   2. Right ventricular systolic function is normal. The right ventricular  size is normal.   3. The mitral valve is normal in structure. Trivial mitral valve  regurgitation. No evidence of mitral stenosis.   4. The aortic valve is normal in structure. Aortic valve regurgitation is  not visualized. No aortic stenosis is present.   5. The inferior vena cava is dilated in size with <50% respiratory  variability, suggesting right atrial pressure of 15 mmHg.    Recent labs: 07/29/2021: Glucose 158, BUN/Cr 10/0.99. EGFR 76. Na/K 144/3.9. AlKP 126. Rest of the CMP normal H/H 11.9/38. MCV 74. Platelets 271 HbA1C N/A Chol 125, TG 238, HDL 39, LDL 48 TSH 2.3 normal  06/14/2021: Chol 158, TG 171, HDL 26, LDL 92  12/26/2020: Glucose 243, BUN/Cr 10/1.17. EGFR 49. Na/K 134/4.6 Trop HS 5,4 H/H 9.8/32.3. MCV 74. Platelets 313 HbA1C 9.7%  07/26/2020: Glucose 160, BUN/Cr 10/1.13. EGFR 60. Na/K 140/4.0. Rest of the CMP normal H/H 9.5/34.7. MCV 70. Platelets 311 HbA1C 9.2% Chol 135, TG 166, HDL 29, LDL 77   Review of Systems  Cardiovascular: Positive for chest pain, dyspnea on exertion, leg swelling and orthopnea. Negative for palpitations and syncope.         Vitals:   10/28/21 1300 10/28/21 1303  BP: (!) 166/81 (!) 166/81  Pulse: 96 99  Resp: 16   Temp: 98 F (36.7 C)   SpO2: 96%     Body mass index is 47.55 kg/m. Filed Weights   10/28/21 1300  Weight: 277 lb (125.6 kg)   Review of Systems  Cardiovascular:  Positive for chest pain and dyspnea on exertion. Negative for leg swelling, palpitations and syncope.  Psychiatric/Behavioral:         Tearful    Objective:   Physical Exam Physical Exam Vitals and nursing note reviewed.  Constitutional:      General: She is not in acute distress.    Appearance: She is obese.  Neck:      Vascular: No JVD.  Cardiovascular:     Rate and Rhythm: Normal rate and regular rhythm.     Heart sounds: Normal heart sounds. No murmur heard. Pulmonary:     Effort: Pulmonary effort is normal.     Breath sounds: Normal breath sounds. No wheezing or  rales.  Musculoskeletal:     Right lower leg: No edema.     Left lower leg: No edema.          Assessment & Recommendations:   46 year old African-American female with hypertension, type 2 diabetes mellitus, HFpEF, moderate persistent asthma, tobacco dependence, morbid obesity, microcytic anemia, uterine dysfunction  Palpitations: Most likely physiological. Will check cardiac telemetry to rule out any significant arrhtymia  Hypertension: Uncontrolled. She has close f/u w/PCP. Recommend regular exercise, weight loss.. If no improvement, consider adding amlodipine 5 mg daily.   Normal coronaries. Can stop Aspirin.   F/u in 6 months. If BP stable, I will see her on as needed basis   Nigel Mormon, MD Pager: [phone] Office: [phone]

## 2021-10-31 ENCOUNTER — Other Ambulatory Visit: Payer: Self-pay

## 2021-10-31 ENCOUNTER — Ambulatory Visit (INDEPENDENT_AMBULATORY_CARE_PROVIDER_SITE_OTHER): Payer: Medicaid Other | Admitting: Orthopedic Surgery

## 2021-10-31 ENCOUNTER — Encounter: Payer: Self-pay | Admitting: Orthopedic Surgery

## 2021-10-31 ENCOUNTER — Telehealth: Payer: Self-pay | Admitting: Orthopedic Surgery

## 2021-10-31 ENCOUNTER — Ambulatory Visit: Payer: Self-pay

## 2021-10-31 VITALS — BP 144/84 | HR 115

## 2021-10-31 DIAGNOSIS — M654 Radial styloid tenosynovitis [de Quervain]: Secondary | ICD-10-CM | POA: Diagnosis not present

## 2021-10-31 DIAGNOSIS — M79642 Pain in left hand: Secondary | ICD-10-CM

## 2021-10-31 MED ORDER — DICLOFENAC SODIUM 1 % EX GEL: 2.0000 g | Freq: Four times a day (QID) | CUTANEOUS | 0 refills | Status: DC

## 2021-10-31 NOTE — Telephone Encounter (Signed)
Patient called asked if she can get a note writing her out of work due to not being able to use both of her hands. Patient said she can only use her hands for short periods of time. Patient said she is disabled. The number to contact patient is [phone]
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
## 2021-10-31 NOTE — Telephone Encounter (Signed)
Please see message below. If your wanting to keep her out of work, how long for ?

## 2021-11-01 ENCOUNTER — Ambulatory Visit (HOSPITAL_COMMUNITY): Payer: Medicaid Other | Attending: Physician Assistant

## 2021-11-01 ENCOUNTER — Encounter: Payer: Self-pay | Admitting: Radiology

## 2021-11-01 NOTE — Telephone Encounter (Signed)
I called and advised patient of Dr. Percell Locus response, I advised that the note will be downstairs for her to pick up

## 2021-11-02 ENCOUNTER — Other Ambulatory Visit: Payer: Self-pay

## 2021-11-02 DIAGNOSIS — M62838 Other muscle spasm: Secondary | ICD-10-CM

## 2021-11-02 MED ORDER — TIZANIDINE HCL 4 MG PO TABS: 6.0000 mg | ORAL_TABLET | Freq: Four times a day (QID) | ORAL | 3 refills | Status: DC | PRN

## 2021-11-03 ENCOUNTER — Encounter: Payer: Self-pay | Admitting: Cardiology

## 2021-11-03 NOTE — Telephone Encounter (Signed)
From patient.

## 2021-11-04 NOTE — Telephone Encounter (Signed)
Recommend seeing her primary doctor to discuss further. I have no medical records of her having either stroke or TIA.   Thanks MJP

## 2021-11-04 NOTE — Telephone Encounter (Signed)
Pt called to inform us that she did not wear the monitor the full 14 days due to irritation and pt mention she did not have a stroke she has TIA.

## 2021-11-07 ENCOUNTER — Encounter: Payer: Medicaid Other | Admitting: Registered Nurse

## 2021-11-08 ENCOUNTER — Ambulatory Visit: Payer: Self-pay

## 2021-11-08 ENCOUNTER — Ambulatory Visit (INDEPENDENT_AMBULATORY_CARE_PROVIDER_SITE_OTHER): Payer: Medicaid Other

## 2021-11-08 ENCOUNTER — Other Ambulatory Visit: Payer: Self-pay

## 2021-11-08 ENCOUNTER — Ambulatory Visit (INDEPENDENT_AMBULATORY_CARE_PROVIDER_SITE_OTHER): Payer: Medicaid Other | Admitting: Orthopaedic Surgery

## 2021-11-08 DIAGNOSIS — M545 Low back pain, unspecified: Secondary | ICD-10-CM

## 2021-11-08 DIAGNOSIS — M549 Dorsalgia, unspecified: Secondary | ICD-10-CM | POA: Diagnosis not present

## 2021-11-08 MED ORDER — PREDNISONE 5 MG (21) PO TBPK: ORAL_TABLET | ORAL | 0 refills | Status: DC
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
## 2021-11-09 ENCOUNTER — Encounter: Payer: Self-pay | Admitting: Internal Medicine

## 2021-11-09 ENCOUNTER — Ambulatory Visit: Payer: Medicaid Other | Admitting: Internal Medicine

## 2021-11-09 VITALS — BP 136/80 | HR 107 | Ht 64.0 in | Wt 266.2 lb

## 2021-11-09 DIAGNOSIS — R109 Unspecified abdominal pain: Secondary | ICD-10-CM

## 2021-11-09 DIAGNOSIS — K219 Gastro-esophageal reflux disease without esophagitis: Secondary | ICD-10-CM | POA: Diagnosis not present

## 2021-11-09 DIAGNOSIS — R112 Nausea with vomiting, unspecified: Secondary | ICD-10-CM | POA: Diagnosis not present

## 2021-11-09 DIAGNOSIS — D509 Iron deficiency anemia, unspecified: Secondary | ICD-10-CM | POA: Diagnosis not present

## 2021-11-09 NOTE — Patient Instructions (Signed)
Stop taking your iron supplement.   Please stay on Dexilant, sucrulfate and alternating zofran and reglan.   You have been scheduled for an abdominal ultrasound at [facility] (1st floor of hospital) on 11/18/21 at 8:00 am. Please arrive 15 minutes prior to your appointment for registration. Make certain not to have anything to eat or drink 6 hours prior to your appointment. Should you need to reschedule your appointment, please contact radiology at [phone]. This test typically takes about 30 minutes to perform.  Due to recent changes in healthcare laws, you may see the results of your imaging and laboratory studies on MyChart before your provider has had a chance to review them.  We understand that in some cases there may be results that are confusing or concerning to you. Not all laboratory results come back in the same time frame and the provider may be waiting for multiple results in order to interpret others.  Please give [place] 48 hours in order for your provider to thoroughly review all the results before contacting the office for clarification of your results.   The [facility] providers would like to encourage you to use [facility] to communicate with providers for non-urgent requests or questions.  Due to long hold times on the telephone, sending your provider a message by [facility] may be a faster and more efficient way to get a response.  Please allow 48 business hours for a response.  Please remember that this is for non-urgent requests.   Heart-Healthy Eating Plan Many factors influence your heart (coronary) health, including eating and exercise habits. Coronary risk increases with abnormal blood fat (lipid) levels. Heart-healthy meal planning includes limiting unhealthy fats, increasing healthy fats, and making other diet and lifestyle changes. What is my plan? Your health care provider may recommend that you: Limit your fat intake to _________% or less of your total calories each  day. Limit your saturated fat intake to _________% or less of your total calories each day. Limit the amount of cholesterol in your diet to less than _________ mg per day. What are tips for following this plan? Cooking Cook foods using methods other than frying. Baking, boiling, grilling, and broiling are all good options. Other ways to reduce fat include: Removing the skin from poultry. Removing all visible fats from meats. Steaming vegetables in water or broth. Meal planning  At meals, imagine dividing your plate into fourths: Fill one-half of your plate with vegetables and green salads. Fill one-fourth of your plate with whole grains. Fill one-fourth of your plate with lean protein foods. Eat 4-5 servings of vegetables per day. One serving equals 1 cup raw or cooked vegetable, or 2 cups raw leafy greens. Eat 4-5 servings of fruit per day. One serving equals 1 medium whole fruit,  cup dried fruit,  cup fresh, frozen, or canned fruit, or  cup 100% fruit juice. Eat more foods that contain soluble fiber. Examples include apples, broccoli, carrots, beans, peas, and barley. Aim to get 25-30 g of fiber per day. Increase your consumption of legumes, nuts, and seeds to 4-5 servings per week. One serving of dried beans or legumes equals  cup cooked, 1 serving of nuts is  cup, and 1 serving of seeds equals 1 tablespoon. Fats Choose healthy fats more often. Choose monounsaturated and polyunsaturated fats, such as olive and canola oils, flaxseeds, walnuts, almonds, and seeds. Eat more omega-3 fats. Choose salmon, mackerel, sardines, tuna, flaxseed oil, and ground flaxseeds. Aim to eat fish at least  2 times each week. Check food labels carefully to identify foods with trans fats or high amounts of saturated fat. Limit saturated fats. These are found in animal products, such as meats, butter, and cream. Plant sources of saturated fats include palm oil, palm kernel oil, and coconut oil. Avoid foods  with partially hydrogenated oils in them. These contain trans fats. Examples are stick margarine, some tub margarines, cookies, crackers, and other baked goods. Avoid fried foods. General information Eat more home-cooked food and less restaurant, buffet, and fast food. Limit or avoid alcohol. Limit foods that are high in starch and sugar. Lose weight if you are overweight. Losing just 5-10% of your body weight can help your overall health and prevent diseases such as diabetes and heart disease. Monitor your salt (sodium) intake, especially if you have high blood pressure. Talk with your health care provider about your sodium intake. Try to incorporate more vegetarian meals weekly. What foods can I eat? Fruits All fresh, canned (in natural juice), or frozen fruits. Vegetables Fresh or frozen vegetables (raw, steamed, roasted, or grilled). Green salads. Grains Most grains. Choose whole wheat and whole grains most of the time. Rice and pasta, including brown rice and pastas made with whole wheat. Meats and other proteins Lean, well-trimmed beef, veal, pork, and lamb. Chicken and [place] without skin. All fish and shellfish. Wild duck, rabbit, pheasant, and venison. Egg whites or low-cholesterol egg substitutes. Dried beans, peas, lentils, and tofu. Seeds and most nuts. Dairy Low-fat or nonfat cheeses, including ricotta and mozzarella. Skim or 1% milk (liquid, powdered, or evaporated). Buttermilk made with low-fat milk. Nonfat or low-fat yogurt. Fats and oils Non-hydrogenated (trans-free) margarines. Vegetable oils, including soybean, sesame, sunflower, olive, peanut, safflower, corn, canola, and cottonseed. Salad dressings or mayonnaise made with a vegetable oil. Beverages Water (mineral or sparkling). Coffee and tea. Diet carbonated beverages. Sweets and desserts Sherbet, gelatin, and fruit ice. Small amounts of dark chocolate. Limit all sweets and desserts. Seasonings and condiments All  seasonings and condiments. The items listed above may not be a complete list of foods and beverages you can eat. Contact a dietitian for more options. What foods are not recommended? Fruits Canned fruit in heavy syrup. Fruit in cream or butter sauce. Fried fruit. Limit coconut. Vegetables Vegetables cooked in cheese, cream, or butter sauce. Fried vegetables. Grains Breads made with saturated or trans fats, oils, or whole milk. Croissants. Sweet rolls. Donuts. High-fat crackers, such as cheese crackers. Meats and other proteins Fatty meats, such as hot dogs, ribs, sausage, bacon, rib-eye roast or steak. High-fat deli meats, such as salami and bologna. Caviar. Domestic duck and goose. Organ meats, such as liver. Dairy Cream, sour cream, cream cheese, and creamed cottage cheese. Whole-milk cheeses. Whole or 2% milk (liquid, evaporated, or condensed). Whole buttermilk. Cream sauce or high-fat cheese sauce. Whole-milk yogurt. Fats and oils Meat fat, or shortening. Cocoa butter, hydrogenated oils, palm oil, coconut oil, palm kernel oil. Solid fats and shortenings, including bacon fat, salt pork, lard, and butter. Nondairy cream substitutes. Salad dressings with cheese or sour cream. Beverages Regular sodas and any drinks with added sugar. Sweets and desserts Frosting. Pudding. Cookies. Cakes. Pies. Milk chocolate or white chocolate. Buttered syrups. Full-fat ice cream or ice cream drinks. The items listed above may not be a complete list of foods and beverages to avoid. Contact a dietitian for more information. Summary Heart-healthy meal planning includes limiting unhealthy fats, increasing healthy fats, and making other diet and lifestyle changes. Lose weight  if you are overweight. Losing just 5-10% of your body weight can help your overall health and prevent diseases such as diabetes and heart disease. Focus on eating a balance of foods, including fruits and vegetables, low-fat or nonfat dairy,  lean protein, nuts and legumes, whole grains, and heart-healthy oils and fats. This information is not intended to replace advice given to you by your health care provider. Make sure you discuss any questions you have with your health care provider. Document Revised: 02/10/2021 Document Reviewed: 02/10/2021 Elsevier Patient Education  2022 [employer].

## 2021-11-09 NOTE — Progress Notes (Signed)
Subjective:    Patient ID: Jill Shaw, female    DOB: 04/15/1976, 46 y.o.   MRN: [id number]  HPI Jill Shaw is a 46 year old female with a history of GERD, chronic abdominal pain, H. pylori negative gastritis, history of IDA, obesity, CHF, COPD, diabetes, hypertension, sickle cell trait, chronic pain who is here for follow-up.  She is here today with her mother.  She was initially seen on 09/21/2021 by Mike GipAmy Esterwood, PA-C.  After her visit with Amy she had EGD which I performed on 10/03/2021.  There is no endoscopic cause for dysphagia though empiric dilation was performed to 18 mm.  There was moderate inflammation in the gastric body and antrum which was biopsied.  The exam was otherwise unremarkable.  Immunohistochemical stain for HP was negative.  There was chronic active gastritis without dysplasia.  She also had a normal gastric emptying scan on 09/29/2021 She missed her right upper quadrant ultrasound appointment recently to evaluate gallbladder.  She reports that she continues to have intermittent nausea though rare vomiting.  She alternates ondansetron which she typically uses 4 mg 2-3 times daily and metoclopramide 5 mg which she typically uses twice daily.  She can hurt in the middle part of her abdomen or "all over".  The pain is burning in nature.  Her chronic hydrocodone which she takes 10 mg 3 times daily seems to help this.  Bowel movements reportedly normal without blood in stool or melena.  Her mother is with her today and she states that her diet is poor high in greasy foods which worsen her symptoms.  She reports she is using Dexilant 30 mg daily.  Iron 3 times a day by mouth.  She is not using senna or promethazine.  She is using some sucralfate which seems to help.   Review of Systems As per HPI, otherwise negative  Current Medications, Allergies, Past Medical History, Past Surgical History, Family History and Social History were reviewed in [facility]  electronic medical record.    Objective:   Physical Exam BP 136/80    Pulse (!) 107    Ht 5\' 4"  (1.626 m)    Wt 266 lb 3.2 oz (120.7 kg)    BMI 45.69 kg/m  Gen: awake, alert, chronically ill-appearing though in no acute distress HEENT: anicteric  CV: Tachycardic but regular Pulm: CTA b/l Abd: soft, obese, NT/ND, +BS throughout Ext: no c/c/e Neuro: nonfocal  CBC    Component Value Date/Time   WBC 13.1 (H) 09/21/2021 1533   RBC 5.36 (H) 09/21/2021 1533   HGB 12.9 09/21/2021 1533   HGB 11.9 07/29/2021 1629   HCT 40.3 09/21/2021 1533   HCT 38.0 07/29/2021 1629   PLT 274.0 09/21/2021 1533   PLT 271 07/29/2021 1629   MCV 75.1 (L) 09/21/2021 1533   MCV 74 (L) 07/29/2021 1629   MCH 23.2 (L) 08/21/2021 2209   MCHC 32.0 09/21/2021 1533   RDW 23.0 (H) 09/21/2021 1533   RDW 21.8 (H) 07/29/2021 1629   LYMPHSABS 3.0 09/21/2021 1533   LYMPHSABS 2.3 07/29/2021 1629   MONOABS 0.6 09/21/2021 1533   EOSABS 0.4 09/21/2021 1533   EOSABS 0.4 07/29/2021 1629   BASOSABS 0.1 09/21/2021 1533   BASOSABS 0.0 07/29/2021 1629   CMP     Component Value Date/Time   NA 140 09/21/2021 1533   NA 141 07/29/2021 1629   K 3.4 (L) 09/21/2021 1533   CL 102 09/21/2021 1533   CO2 28 09/21/2021 1533  GLUCOSE 118 (H) 09/21/2021 1533   BUN 13 09/21/2021 1533   BUN 10 07/29/2021 1629   CREATININE 1.10 09/21/2021 1533   CREATININE 1.18 (H) 08/31/2017 1621   CALCIUM 10.0 09/21/2021 1533   PROT 7.9 08/21/2021 2209   PROT 7.2 07/29/2021 1629   ALBUMIN 4.6 08/21/2021 2209   ALBUMIN 4.6 07/29/2021 1629   AST 17 08/21/2021 2209   ALT 8 08/21/2021 2209   ALKPHOS 69 08/21/2021 2209   BILITOT 0.6 08/21/2021 2209   BILITOT <0.2 07/29/2021 1629   GFRNONAA 60 (L) 08/21/2021 2209   GFRNONAA 58 (L) 08/31/2017 1621   GFRAA 49 (L) 12/02/2020 1405   GFRAA 67 08/31/2017 1621   CT ABDOMEN AND PELVIS WITH CONTRAST   TECHNIQUE: Multidetector CT imaging of the abdomen and pelvis was performed using the standard  protocol following bolus administration of intravenous contrast.   CONTRAST:  OMNIPAQUE IOHEXOL 300 MG/ML  SOLN   COMPARISON:  Multiple priors, most recently 06/22/2021   FINDINGS: Lower chest: Lung bases are clear.   Hepatobiliary: Stable heterogeneous appearance with multifocal geographic low density, including in the lateral segment left hepatic lobe and inferior right hepatic lobe, presumably related to heterogeneous hepatic steatosis.   Gallbladder is notable for layering tiny gallstones (series 3/image 32). No stone intrahepatic or extrahepatic ductal dilatation.   Pancreas: Within normal limits.   Spleen: Within normal limits.   Adrenals/Urinary Tract: Adrenal glands are within normal limits.   Kidneys are within normal limits.  No hydronephrosis.   Bladder is mildly thick-walled although underdistended.   Stomach/Bowel: Stomach is within normal limits.   No evidence of bowel obstruction.   Normal appendix (series 3/image 53).   No colonic wall thickening or inflammatory changes.   Vascular/Lymphatic: No evidence of abdominal aortic aneurysm.   No suspicious abdominopelvic lymphadenopathy.   Reproductive: Uterus is within normal limits.   Bilateral ovaries are within normal limits.   Other: No abdominopelvic ascites.   Musculoskeletal: Visualized osseous structures are within normal limits.   IMPRESSION: No CT findings to account for the patient's abdominal pain.   Geographic hepatic steatosis, chronic.     Electronically Signed   By: Charline Bills M.D.   On: 08/19/2021 03:10      Assessment & Plan:  46 year old female with a history of GERD, chronic abdominal pain, H. pylori negative gastritis, history of IDA, obesity, CHF, COPD, diabetes, hypertension, sickle cell trait, chronic pain who is here for follow-up.   Nausea/vomiting/abdominal pain/GERD/gastritis--symptoms are chronic with acute exacerbations.  I have a high suspicion for  gastroparesis though this was not evident on gastric emptying scan.  She has diabetes at times uncontrolled blood sugars and uses narcotic pain medications.  All of these things will impair gastric emptying and gut motility.  We discussed this today.  She also eats a high-fat diet at times according to her mother which certainly does not help.  Iron may also be causing gastric upset.  We will exclude gallbladder disease. --She will continue ondansetron 4 mg every 8 hours as needed nausea --She will continue metoclopramide 5 mg every 12 hours as needed for breakthrough nausea or vomiting --We discussed gastroparesis diet in detail today and I requested she avoid sodas as well as high-fat foods --Continue Dexilant 30 mg daily --Stop oral iron as this may exacerbate abdominal pain --I have removed senna and promethazine from her med list as she is not using these --She can use sucralfate 1 g 3 times daily AC as  needed for now --Reschedule right upper quadrant ultrasound to evaluate for additional gallbladder pathology --Polypharmacy is not helping with the symptoms  2.  IDA --history of.  If oral iron is causing GI upset then she will need to be considered for IV iron per primary care.  Colonoscopy recommended as well but she cannot tolerate the prep due to #1.  3.  Colon cancer screening --she will be 46 in a few weeks.  Colonoscopy is recommended for multiple reasons however she cannot tolerate oral bowel preparation at this time.  We discussed this today.  We can discuss this again at follow-up.  30 minutes total spent today including patient facing time, coordination of care, reviewing medical history/procedures/pertinent radiology studies, and documentation of the encounter.

## 2021-11-10 ENCOUNTER — Telehealth: Payer: Self-pay

## 2021-11-10 ENCOUNTER — Telehealth: Payer: Self-pay | Admitting: Internal Medicine

## 2021-11-10 NOTE — Telephone Encounter (Signed)
Patient called stating that she did not get her xray results and she wanted to be called with those results. She has still yet to get her MRI at this time also.

## 2021-11-10 NOTE — Telephone Encounter (Signed)
Ok that's fine

## 2021-11-10 NOTE — Telephone Encounter (Signed)
Patient came into the office stating that she needs a more detailed letter for her job stating that she cant walk sit or stand for a long period of time patient said that she would rather just be taken out of work

## 2021-11-10 NOTE — Telephone Encounter (Signed)
Inbound call from patient requesting a letter for out of work due to her medical condition that keeps her sick. States she have been unable to work due to her feeling sick

## 2021-11-10 NOTE — Telephone Encounter (Signed)
Left message for pt to call back  °

## 2021-11-11 ENCOUNTER — Other Ambulatory Visit: Payer: Self-pay

## 2021-11-11 ENCOUNTER — Encounter: Payer: Medicaid Other | Attending: Physical Medicine and Rehabilitation | Admitting: Registered Nurse

## 2021-11-11 VITALS — BP 164/93 | HR 108 | Temp 99.0°F | Ht 64.0 in | Wt 270.0 lb

## 2021-11-11 DIAGNOSIS — M797 Fibromyalgia: Secondary | ICD-10-CM | POA: Diagnosis not present

## 2021-11-11 DIAGNOSIS — M2550 Pain in unspecified joint: Secondary | ICD-10-CM | POA: Insufficient documentation

## 2021-11-11 DIAGNOSIS — M25512 Pain in left shoulder: Secondary | ICD-10-CM | POA: Insufficient documentation

## 2021-11-11 DIAGNOSIS — R200 Anesthesia of skin: Secondary | ICD-10-CM | POA: Insufficient documentation

## 2021-11-11 DIAGNOSIS — Z79891 Long term (current) use of opiate analgesic: Secondary | ICD-10-CM | POA: Diagnosis not present

## 2021-11-11 DIAGNOSIS — R000 Tachycardia, unspecified: Secondary | ICD-10-CM | POA: Insufficient documentation

## 2021-11-11 DIAGNOSIS — R110 Nausea: Secondary | ICD-10-CM | POA: Insufficient documentation

## 2021-11-11 DIAGNOSIS — Y92009 Unspecified place in unspecified non-institutional (private) residence as the place of occurrence of the external cause: Secondary | ICD-10-CM | POA: Diagnosis not present

## 2021-11-11 DIAGNOSIS — R269 Unspecified abnormalities of gait and mobility: Secondary | ICD-10-CM | POA: Insufficient documentation

## 2021-11-11 DIAGNOSIS — M25562 Pain in left knee: Secondary | ICD-10-CM | POA: Insufficient documentation

## 2021-11-11 DIAGNOSIS — R531 Weakness: Secondary | ICD-10-CM | POA: Insufficient documentation

## 2021-11-11 DIAGNOSIS — R61 Generalized hyperhidrosis: Secondary | ICD-10-CM | POA: Insufficient documentation

## 2021-11-11 DIAGNOSIS — M170 Bilateral primary osteoarthritis of knee: Secondary | ICD-10-CM | POA: Diagnosis not present

## 2021-11-11 DIAGNOSIS — M542 Cervicalgia: Secondary | ICD-10-CM | POA: Diagnosis not present

## 2021-11-11 DIAGNOSIS — Z9181 History of falling: Secondary | ICD-10-CM | POA: Diagnosis not present

## 2021-11-11 DIAGNOSIS — M25511 Pain in right shoulder: Secondary | ICD-10-CM | POA: Diagnosis not present

## 2021-11-11 DIAGNOSIS — W19XXXD Unspecified fall, subsequent encounter: Secondary | ICD-10-CM | POA: Insufficient documentation

## 2021-11-11 DIAGNOSIS — Z760 Encounter for issue of repeat prescription: Secondary | ICD-10-CM | POA: Diagnosis not present

## 2021-11-11 DIAGNOSIS — E1136 Type 2 diabetes mellitus with diabetic cataract: Secondary | ICD-10-CM | POA: Insufficient documentation

## 2021-11-11 DIAGNOSIS — Z5181 Encounter for therapeutic drug level monitoring: Secondary | ICD-10-CM | POA: Diagnosis not present

## 2021-11-11 DIAGNOSIS — R062 Wheezing: Secondary | ICD-10-CM | POA: Insufficient documentation

## 2021-11-11 DIAGNOSIS — M5416 Radiculopathy, lumbar region: Secondary | ICD-10-CM | POA: Diagnosis not present

## 2021-11-11 DIAGNOSIS — Z79899 Other long term (current) drug therapy: Secondary | ICD-10-CM | POA: Diagnosis not present

## 2021-11-11 DIAGNOSIS — G629 Polyneuropathy, unspecified: Secondary | ICD-10-CM | POA: Insufficient documentation

## 2021-11-11 DIAGNOSIS — M545 Low back pain, unspecified: Secondary | ICD-10-CM | POA: Diagnosis not present

## 2021-11-11 DIAGNOSIS — M79601 Pain in right arm: Secondary | ICD-10-CM | POA: Insufficient documentation

## 2021-11-11 DIAGNOSIS — G894 Chronic pain syndrome: Secondary | ICD-10-CM | POA: Diagnosis not present

## 2021-11-11 DIAGNOSIS — E1142 Type 2 diabetes mellitus with diabetic polyneuropathy: Secondary | ICD-10-CM | POA: Diagnosis not present

## 2021-11-11 DIAGNOSIS — M79642 Pain in left hand: Secondary | ICD-10-CM | POA: Insufficient documentation

## 2021-11-11 DIAGNOSIS — R42 Dizziness and giddiness: Secondary | ICD-10-CM | POA: Insufficient documentation

## 2021-11-11 DIAGNOSIS — G4700 Insomnia, unspecified: Secondary | ICD-10-CM | POA: Diagnosis not present

## 2021-11-11 DIAGNOSIS — R0602 Shortness of breath: Secondary | ICD-10-CM | POA: Insufficient documentation

## 2021-11-11 DIAGNOSIS — M25561 Pain in right knee: Secondary | ICD-10-CM | POA: Insufficient documentation

## 2021-11-11 DIAGNOSIS — K2970 Gastritis, unspecified, without bleeding: Secondary | ICD-10-CM | POA: Insufficient documentation

## 2021-11-11 DIAGNOSIS — R410 Disorientation, unspecified: Secondary | ICD-10-CM | POA: Insufficient documentation

## 2021-11-11 DIAGNOSIS — M79641 Pain in right hand: Secondary | ICD-10-CM | POA: Insufficient documentation

## 2021-11-11 DIAGNOSIS — R262 Difficulty in walking, not elsewhere classified: Secondary | ICD-10-CM | POA: Insufficient documentation

## 2021-11-11 DIAGNOSIS — R450 Nervousness: Secondary | ICD-10-CM | POA: Insufficient documentation

## 2021-11-11 DIAGNOSIS — R109 Unspecified abdominal pain: Secondary | ICD-10-CM | POA: Insufficient documentation

## 2021-11-11 DIAGNOSIS — K5900 Constipation, unspecified: Secondary | ICD-10-CM | POA: Insufficient documentation

## 2021-11-11 MED ORDER — HYDROCODONE-ACETAMINOPHEN 10-325 MG PO TABS: 1.0000 | ORAL_TABLET | Freq: Three times a day (TID) | ORAL | 0 refills | Status: DC | PRN

## 2021-11-11 MED ORDER — SUCRALFATE 1 GM/10ML PO SUSP: 1.0000 g | Freq: Four times a day (QID) | ORAL | 1 refills | Status: DC

## 2021-11-11 MED ORDER — ONDANSETRON HCL 4 MG PO TABS: 4.0000 mg | ORAL_TABLET | Freq: Three times a day (TID) | ORAL | 1 refills | Status: DC | PRN

## 2021-11-11 MED ORDER — METOCLOPRAMIDE HCL 5 MG PO TABS: 5.0000 mg | ORAL_TABLET | Freq: Two times a day (BID) | ORAL | 3 refills | Status: DC

## 2021-11-11 MED ORDER — DEXLANSOPRAZOLE 30 MG PO CPDR
30.0000 mg | DELAYED_RELEASE_CAPSULE | Freq: Every day | ORAL | 11 refills | Status: DC
Start: 2021-11-11 — End: 2022-11-27

## 2021-11-11 NOTE — Telephone Encounter (Signed)
Until f/u appt

## 2021-11-11 NOTE — Telephone Encounter (Signed)
Left message for pt to call back  °

## 2021-11-11 NOTE — Telephone Encounter (Signed)
Note sent through mychart

## 2021-11-11 NOTE — Telephone Encounter (Signed)
Pt called and states she is still sick and unable to work due to her mobility/breathing/abd pain/ nausea. She states she is unable to work and has been having these issues for about 9 days now. She is requesting a letter stating that she is unable to work due to her symptoms. Please advise.

## 2021-11-11 NOTE — Telephone Encounter (Signed)
Spoke with pt. Let pt know what Dr. Rhea Belton had said. Pt verbalized understanding and had no other concerns at end of call.

## 2021-11-11 NOTE — Progress Notes (Signed)
Subjective:    Patient ID: Jill Shaw, female    DOB: [birth year], 46 y.o.   MRN: [id number]  HPI: Jill Shaw is a 46 y.o. female who returns for follow up appointment for chronic pain and medication refill. She states her pain is located in her neck, lower back pain radiating into her bilateral lower extremities and bilateral knee pain. She also reports tingling and burning into her bilateral hands and bilateral feet. She also reports she has abdominal pain and she was diagnosed with gastritis, she states GI Following. Dr Rhea BeltonPyrtle note was reviewed.   Jill Shaw sates she had a fall on 11/02/2021, she became dizzy and fell on her right side. She states her family helped her up. She also reports she was evaluated by her PCP and Ortho obtained X-rays. Educated on falls prevention, she verbalizes understanding.   Jill Shaw sent a My-Chart Message on 11/07/2021, stating she took more of her medication than prescribed, narcotic contract was reviewed. She verbalizes understanding.   She rates her pain 10. Her current exercise regime is walking and performing stretching exercises.  Jill Shaw Morphine equivalent is 30.00 MME.   Oral Swab was Performed Today.    Pain Inventory Average Pain 10 Pain Right Now 10 My pain is sharp, burning, dull, stabbing, tingling, and aching  In the last 24 hours, has pain interfered with the following? General activity  n/a Relation with others  n/a Enjoyment of life  n/a What TIME of day is your pain at its worst? morning , daytime, evening, and night Sleep (in general) Poor  Pain is worse with: walking, bending, inactivity, standing, and some activites Pain improves with: rest, heat/ice, and medication Relief from Meds:  n/a  Family History  Problem Relation Age of Onset   Diabetes Mother    Hypertension Mother    Migraines Paternal Grandfather    Colon cancer Neg Hx    Esophageal cancer Neg Hx    Stomach cancer Neg Hx    Rectal cancer  Neg Hx    Social History   Socioeconomic History   Marital status: Legally Separated    Spouse name: Not on file   Number of children: 3   Years of education: Not on file   Highest education level: Not on file  Occupational History   Occupation: unemployed  Tobacco Use   Smoking status: Some Days    Packs/day: 0.25    Years: 26.00    Pack years: 6.50    Types: Cigarettes    Last attempt to quit: 09/13/2020    Years since quitting: 1.1   Smokeless tobacco: Never   Tobacco comments:    4-5 cigarettes/day  Vaping Use   Vaping Use: Never used  Substance and Sexual Activity   Alcohol use: No   Drug use: No   Sexual activity: Not Currently    Birth control/protection: None  Other Topics Concern   Not on file  Social History Narrative   Right Handed   Lives in a one story apartment, but lives on the second floor   Drinks caffeine once in awhile   Social Determinants of Health   Financial Resource Strain: Not on file  Food Insecurity: No Food Insecurity   Worried About Programme researcher, broadcasting/film/videounning Out of Food in the Last Year: Never true   Ran Out of Food in the Last Year: Never true  Transportation Needs: No Transportation Needs   Lack of Transportation (Medical): No   Lack of Transportation (  Non-Medical): No  Physical Activity: Not on file  Stress: Not on file  Social Connections: Not on file   Past Surgical History:  Procedure Laterality Date   CESAREAN SECTION     x 1. for twins   DILATION AND CURETTAGE OF UTERUS N/A 08/20/2019   Procedure: DILATATION AND CURETTAGE;  Surgeon: Allie Bossier, MD;  [facility]: [facility];  Service: Gynecology;  Laterality: N/A;   ENDOMETRIAL ABLATION N/A 08/20/2019   Procedure: Minerva Ablation;  Surgeon: Allie Bossier, MD;  Location: [facility];  Service: Gynecology;  Laterality: N/A;   EYE SURGERY Bilateral    laser right and cataract removed left eye   LEFT HEART CATH AND CORONARY ANGIOGRAPHY N/A 08/30/2021   Procedure: LEFT HEART CATH AND CORONARY ANGIOGRAPHY;   Surgeon: Elder Negus, MD;  [facility];  Service: Cardiovascular;  Laterality: N/A;   RADIOLOGY WITH ANESTHESIA N/A 09/16/2019   Procedure: MRI WITH ANESTHESIA   L SPINE WITHOUT CONTRAST, T SPINE WITHOUT CONTRAST , CERVICAL WITHOUT CONTRAST;  Surgeon: Radiologist, Medication, MD;  Location[facility];  Service: Radiology;  Laterality: N/A;   TUBAL LIGATION     interval BTL   UPPER GI ENDOSCOPY  07/2017   Past Surgical History:  Procedure Laterality Date   CESAREAN SECTION     x 1. for twins   DILATION AND CURETTAGE OF UTERUS N/A 08/20/2019   Procedure: DILATATION AND CURETTAGE;  Surgeon: Allie Bossier, MD;  [facility];  Service: Gynecology;  Laterality: N/A;   ENDOMETRIAL ABLATION N/A 08/20/2019   Procedure: Minerva Ablation;  Surgeon: Allie Bossier, MD;  [facility];  Service: Gynecology;  Laterality: N/A;   EYE SURGERY Bilateral    laser right and cataract removed left eye   LEFT HEART CATH AND CORONARY ANGIOGRAPHY N/A 08/30/2021   Procedure: LEFT HEART CATH AND CORONARY ANGIOGRAPHY;  Surgeon: Elder Negus, MD;  [facility];  Service: Cardiovascular;  Laterality: N/A;   RADIOLOGY WITH ANESTHESIA N/A 09/16/2019   Procedure: MRI WITH ANESTHESIA   L SPINE WITHOUT CONTRAST, T SPINE WITHOUT CONTRAST , CERVICAL WITHOUT CONTRAST;  Surgeon: Radiologist, Medication, MD;  [facility];  Service: Radiology;  Laterality: N/A;   TUBAL LIGATION     interval BTL   UPPER GI ENDOSCOPY  07/2017   Past Medical History:  Diagnosis Date   Anemia    Arthritis    knees, hands   Asthma    Chronic diastolic (congestive) heart failure (HCC)    COPD (chronic obstructive pulmonary disease) (HCC)    Diabetes mellitus without complication (HCC)    type 2   DKA (diabetic ketoacidosis) (HCC) 10/15/2020   Dysfunctional uterine bleeding    Gastritis    GERD (gastroesophageal reflux disease)    Hypertension    Neuromuscular disorder (HCC)    neuropathy  feet   Seizures (HCC) 09/12/2017   pt states r/t stress and blood sugar - no meds last one 4 months ago, not seen neurologist   Sickle cell trait (HCC)    Smoker    Vitamin D deficiency 10/2019   Wears glasses    BP (!) 178/82    Pulse (!) 118    Temp 99 F (37.2 C) (Oral)    Ht 5\' 4"  (1.626 m)    Wt 270 lb (122.5 kg)    BMI 46.35 kg/m   Opioid Risk Score:   Fall Risk Score:  `1  Depression screen [facility] 2/9  [facility] 10/11/2021 09/30/2021 08/01/2021 07/29/2021 06/14/2021 04/15/2021 03/30/2021  Decreased Interest 1 0 1 2 0 2 0  Down, Depressed, Hopeless - 0 1 2 0 2 0  PHQ - 2 Score 1 0 2 4 0 4 0  Altered sleeping - - - 3 - 3 -  Tired, decreased energy - - - 2 - 3 -  Change in appetite - - - 0 - 0 -  Feeling bad or failure about yourself  - - - - - 0 -  Trouble concentrating - - - 2 - 2 -  Moving slowly or fidgety/restless - - - 0 - 0 -  Suicidal thoughts - - - 0 - 0 -  PHQ-9 Score - - - 11 - 12 -  Some recent data might be hidden       Review of Systems  Constitutional: Negative.   HENT: Negative.    Eyes: Negative.   Respiratory: Negative.    Cardiovascular: Negative.   Gastrointestinal: Negative.   Endocrine: Negative.   Genitourinary: Negative.   Musculoskeletal:  Positive for back pain and gait problem.       Pain in right and left shoulder , pain in both hands , pain inright leg , pain in right knee.  Skin: Negative.   Allergic/Immunologic: Negative.   Hematological: Negative.   Psychiatric/Behavioral: Negative.        Objective:   Physical Exam Vitals and nursing note reviewed.  Constitutional:      Appearance: Normal appearance.  Neck:     Comments: Cervical Paraspinal Tenderness: C-5-C-6 Cardiovascular:     Rate and Rhythm: Normal rate and regular rhythm.     Pulses: Normal pulses.     Heart sounds: Normal heart sounds.  Pulmonary:     Effort: Pulmonary effort is normal.     Breath sounds: Normal breath sounds.  Musculoskeletal:      Cervical back: Normal range of motion and neck supple.     Comments: Normal Muscle Bulk and Muscle Testing Reveals:  Upper Extremities: Full ROM and Muscle Strength 5/5 Bilateral AC Joint Tenderness  Lumbar Hypersensitivity Lower Extremities: Right: Decreased ROM and Muscle Strength 5/5  Right Lower Extremity Flexion Produces Pain into her Right Patella Left Lower Extremity: Full ROM and Muscle Strength 5/5 Arises from Table Slowly Narrow Based Gait     Skin:    General: Skin is warm and dry.  Neurological:     Mental Status: She is alert and oriented to person, place, and time.  Psychiatric:        Mood and Affect: Mood normal.        Behavior: Behavior normal.         Assessment & Plan:  1. Lumbar Radiculitis: Continue current medication regimen. Continue HEP as Tolerated.11/11/2021 2. Fibromyalgia/Polyneuropathy: Continue HEP as Tolerated. Continue current Medication regimen. Continue to Monitor. 11/11/2021 3. Bilateral Knee Pain: No complaints today. Continue to Monitor.11/11/2021 4. Chronic Pain Syndrome: Continue Hydrocodone 10/325mg  one tablet three times  a day as needed for pain #90. We will continue the opioid monitoring program, this consists of regular clinic visits, examinations, urine drug screen, pill counts as well as use of [place] Controlled Substance Reporting system. A 12 month History has been reviewed on the [place] Controlled Substance Reporting System on 11/11/2021.  5. Tachycardia: Apical pulse check. Jill Shaw will F/U with her PCP and Cardiologist. Continue to monitor.  11/11/2021  6. Fall at Home: Educated on Falls Prevention: PCP  and Ortho Following per Ms. Laughman. She verbalizes understanding.   F/U in 1 month

## 2021-11-13 ENCOUNTER — Encounter: Payer: Self-pay | Admitting: Physical Medicine and Rehabilitation

## 2021-11-14 ENCOUNTER — Other Ambulatory Visit: Payer: Self-pay | Admitting: Internal Medicine

## 2021-11-14 ENCOUNTER — Encounter: Payer: Self-pay | Admitting: Obstetrics and Gynecology

## 2021-11-14 ENCOUNTER — Encounter: Payer: Self-pay | Admitting: Registered Nurse

## 2021-11-15 ENCOUNTER — Encounter (HOSPITAL_BASED_OUTPATIENT_CLINIC_OR_DEPARTMENT_OTHER): Payer: Medicaid Other | Admitting: Physical Medicine and Rehabilitation

## 2021-11-15 ENCOUNTER — Other Ambulatory Visit: Payer: Self-pay

## 2021-11-15 DIAGNOSIS — G894 Chronic pain syndrome: Secondary | ICD-10-CM | POA: Diagnosis not present

## 2021-11-15 NOTE — Progress Notes (Signed)
Subjective:    Patient ID: Jill Shaw, female    DOB: 30-Aug-1976, 46 y.o.   MRN: [id number]   HPI: An audio/video tele-health visit is felt to be the most appropriate encounter for this patient at this time. This is a follow up tele-visit via phone. The patient is at home. MD is at office. Prior to scheduling this appointment, our staff discussed the limitations of evaluation and management by telemedicine and the availability of in-person appointments. The patient expressed understanding and agreed to proceed.      Jill Shaw is a 46 y.o. female who returns for follow-up appointment for chronic pain, diabetic peripheral neuropathy, and insomnia.   1) Insomnia: -She continues to experience insomnia at night. Asks about Ambien and I discussed that this is an addictive medication and there are other safer options we can try first that can also help with her pain.  -She is currently not taking either of these medications, or Amitriptyline or Trazodone.  -She takes Requip for resltless legs but this does not help. -She is still sleeping very poorly -She does use screens before bed time  2) Back pain secondary to lumbar radiculitis.  -She states her pain is located in her lower back radiating into her bilateral lower extremities and bilateral knee pain. She denies falling. She rates her pain 9. Her current exercise regime is walking.  -Ms. Vandenbrink Morphine equivalent is 20.00 MME.  -She was hospitalized for serotonin syndrome after use of Savella and Cymbalta.  -Back pain has been severe -She requests a renewal of her handicap placard as she is unable to walk 200 feet without stopping to rest.  -Her pain has been better controlled with Norco -she would like to get another XR given the chronicity of her pain.  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  -now present in her hands as well- she has been dropping objects due to her neuropathy  4) Right shoulder and arm pain -notes limited range of motion in her shoulder -does have pain radiating into right arm from neck and arm feels heavy at times   Pain Inventory Average Pain 9 Pain Right Now 9 My pain is sharp, burning, tingling and aching  In the last 24 hours, has pain interfered with the following? General activity 0 Relation with others 0 Enjoyment of life 5 What TIME of day is your pain at its worst? morning , daytime, evening and night Sleep (in general) Poor  Pain is worse with: walking, bending, sitting, inactivity, standing and some activites Pain improves with: heat/ice and medication Relief from Meds: 67  Family History  Problem Relation Age of Onset   Diabetes Mother    Hypertension Mother    Migraines Paternal Grandfather    Colon cancer Neg Hx    Esophageal cancer Neg Hx    Stomach cancer Neg Hx    Rectal cancer Neg Hx    Social History   Socioeconomic History   Marital status: Legally Separated    Spouse name: Not on file   Number of children: 3   Years of education: Not on file   Highest education level: Not on file  Occupational History   Occupation: unemployed  Tobacco Use   Smoking status: Some Days    Packs/day: 0.25    Years: 26.00    Pack years: 6.50    Types: Cigarettes    Last attempt to quit: 09/13/2020    Years since quitting: 1.1   Smokeless tobacco: Never   Tobacco comments:    4-5 cigarettes/day  Vaping Use   Vaping Use: Never used  Substance and Sexual Activity   Alcohol use: No   Drug use: No   Sexual activity: Not Currently    Birth control/protection: None  Other Topics Concern   Not on file  Social History Narrative   Right  Handed   Lives in a one story apartment, but lives on the second floor   Drinks caffeine once in awhile   Social Determinants of Health   Financial Resource Strain: Not on file  Food Insecurity: No Food Insecurity   Worried About Charity fundraiser in the Last Year: Never true   Ran Out of Food in the Last Year: Never true  Transportation Needs: No Transportation Needs   Lack of Transportation (Medical): No   Lack of Transportation (Non-Medical): No  Physical Activity: Not on file  Stress: Not on file  Social Connections: Not on file   Past Surgical History:  Procedure Laterality Date   CESAREAN SECTION     x 1. for twins   DILATION AND CURETTAGE OF UTERUS N/A 08/20/2019   Procedure: DILATATION AND CURETTAGE;  Surgeon: Emily Filbert, MD;  Location: [place];  Service: Gynecology;  Laterality: N/A;   ENDOMETRIAL ABLATION N/A 08/20/2019   Procedure: Minerva Ablation;  Surgeon: Emily Filbert, MD;  Location: [place];  Service: Gynecology;  Laterality: N/A;   EYE SURGERY Bilateral    laser right and cataract removed left eye   LEFT HEART CATH AND CORONARY ANGIOGRAPHY N/A 08/30/2021   Procedure: LEFT HEART CATH AND CORONARY ANGIOGRAPHY;  Surgeon: Nigel Mormon, MD;  Location: [place] [facility];  Service: Cardiovascular;  Laterality: N/A;   RADIOLOGY WITH ANESTHESIA N/A 09/16/2019   Procedure: MRI WITH ANESTHESIA   L SPINE WITHOUT CONTRAST, T SPINE WITHOUT CONTRAST , CERVICAL WITHOUT CONTRAST;  Surgeon: Radiologist, Medication, MD;  Location: [place];  Service: Radiology;  Laterality: N/A;   TUBAL LIGATION     interval BTL   UPPER GI ENDOSCOPY  07/2017   Past Surgical History:  Procedure Laterality Date   CESAREAN SECTION     x 1. for twins   DILATION AND CURETTAGE OF UTERUS N/A 08/20/2019   Procedure: DILATATION AND CURETTAGE;  Surgeon: Emily Filbert, MD;  Location: [place];  Service: Gynecology;  Laterality: N/A;   ENDOMETRIAL ABLATION N/A 08/20/2019   Procedure: Minerva Ablation;   Surgeon: Emily Filbert, MD;  Location: [place];  Service: Gynecology;  Laterality: N/A;   EYE SURGERY Bilateral    laser right and cataract removed left eye   LEFT HEART CATH AND CORONARY ANGIOGRAPHY N/A 08/30/2021   Procedure: LEFT HEART CATH AND CORONARY ANGIOGRAPHY;  Surgeon: Nigel Mormon, MD;  Location: [place] [facility];  Service: Cardiovascular;  Laterality: N/A;   RADIOLOGY WITH ANESTHESIA N/A 09/16/2019   Procedure: MRI WITH ANESTHESIA   L SPINE WITHOUT CONTRAST, T SPINE WITHOUT CONTRAST , CERVICAL WITHOUT CONTRAST;  Surgeon: Radiologist, Medication, MD;  Location: [place];  Service: Radiology;  Laterality: N/A;   TUBAL LIGATION     interval BTL   UPPER  GI ENDOSCOPY  07/2017   Past Medical History:  Diagnosis Date   Anemia    Arthritis    knees, hands   Asthma    Chronic diastolic (congestive) heart failure (HCC)    COPD (chronic obstructive pulmonary disease) (HCC)    Diabetes mellitus without complication ([place])    type 2   DKA (diabetic ketoacidosis) ([place]) 10/15/2020   Dysfunctional uterine bleeding    Gastritis    GERD (gastroesophageal reflux disease)    Hypertension    Neuromuscular disorder ([place])    neuropathy feet   Seizures ([place]) 09/12/2017   pt states r/t stress and blood sugar - no meds last one 4 months ago, not seen neurologist   Sickle cell trait ([place])    Smoker    Vitamin D deficiency 10/2019   Wears glasses    There were no vitals taken for this visit.  Opioid Risk Score:   Fall Risk Score:  `1  Depression screen PHQ 2/9  Depression screen [facility] 2/9 11/11/2021 10/11/2021 09/30/2021 08/01/2021 07/29/2021 06/14/2021 04/15/2021  Decreased Interest 1 1 0 1 2 0 2  Down, Depressed, Hopeless 1 - 0 1 2 0 2  PHQ - 2 Score 2 1 0 2 4 0 4  Altered sleeping - - - - 3 - 3  Tired, decreased energy - - - - 2 - 3  Change in appetite - - - - 0 - 0  Feeling bad or failure about yourself  - - - - - - 0  Trouble concentrating - - - - 2 - 2  Moving slowly or  fidgety/restless - - - - 0 - 0  Suicidal thoughts - - - - 0 - 0  PHQ-9 Score - - - - 11 - 12  Some recent data might be hidden     Review of Systems  Constitutional:  Positive for diaphoresis and unexpected weight change.  Respiratory:  Positive for shortness of breath and wheezing.   Gastrointestinal:  Positive for abdominal pain, constipation and nausea.  Musculoskeletal:  Positive for arthralgias, back pain, gait problem and myalgias.  Neurological:  Positive for dizziness, weakness and numbness.  Hematological:  Bruises/bleeds easily.  Psychiatric/Behavioral:  Positive for confusion and decreased concentration. The patient is nervous/anxious.   All other systems reviewed and are negative.     Objective:  Not performed as patient was seen via phone.      Assessment & Plan:  1. Lumbar Radiculitis: Continue current medication regimen. Continue HEP as Tolerated. Provided with handicap placard. XR ordered. Note for work provided via Pharmacist, community.  2. Fibromyalgia: Continue HEP as Tolerated. Continue current Medication regimen. Continue to Monitor.  3. Bilateral Knee Pain: Right knee is currently worst, XR ordered and will call with results.  Continue to Monitor.  4. Chronic Pain Syndrome: Refilled Hydrocodone 10/325mg  one tablet twice a day as needed for pain #60. We will continue the opioid monitoring program, this consists of regular clinic visits, examinations, urine drug screen, pill counts as well as use of [place] Controlled Substance Reporting system. A 12 month History has been reviewed on the [facility] on 02/23/2021. -provided note explaining her inability to work due to her pain 5. Insomnia: -Try to go outside near sunrise -Get exercise during the day.  -Discussed good sleep hygiene: turning off all devices an hour before bedtime.  -Chamomile tea with dinner.  -Melatonin did for help. -warm bath or shower before bed. -apply  lavender oil  for forehead at night 6. Diabetic peripheral neuropathy -Discussed Qutenza as an option for neuropathic pain control. Discussed that this is a capsaicin patch, stronger than capsaicin cream. Discussed that it is currently approved for diabetic peripheral neuropathy and post-herpetic neuralgia, but that it has also shown benefit in treating other forms of neuropathy. Provided patient with link to site to learn more about the patch: [employer]. Discussed that the patch would be placed in office and benefits usually last 3 months. Discussed that unintended exposure to capsaicin can cause severe irritation of eyes, mucous membranes, respiratory tract, and skin, but that Qutenza is a local treatment and does not have the systemic side effects of other nerve medications. Discussed that there may be pain, itching, erythema, and decreased sensory function associated with the application of Qutenza. Side effects usually subside within 1 week. A cold pack of analgesic medications can help with these side effects. Blood pressure can also be increased due to pain associated with administration of the patch.  -repeat in 3 months given good response, discussed cumulative benefit with each application 4 patches of Qutenza was applied to the area of pain. Ice packs were applied during the procedure to ensure patient comfort. Blood pressure was monitored every 15 minutes. The patient tolerated the procedure well. Post-procedure instructions were given and follow-up has been scheduled.    7. Right sided shoulder and arm pain -discussed that could be a component or frozen shoulder and/or cervical radiculitis -will obtain right shoulder XR and cervical spine XR and call when results are available  5 minutes spent in discussing patient's pain, response to Qutenza, writing note for patient stating patient's work limitations.

## 2021-11-16 ENCOUNTER — Ambulatory Visit: Payer: Self-pay | Admitting: Nurse Practitioner

## 2021-11-16 ENCOUNTER — Telehealth: Payer: Self-pay | Admitting: Gastroenterology

## 2021-11-16 ENCOUNTER — Other Ambulatory Visit: Payer: Self-pay

## 2021-11-16 LAB — DRUG TOX MONITOR 1 W/CONF, ORAL FLD
Amphetamines: NEGATIVE ng/mL (ref <10)
Barbiturates: NEGATIVE ng/mL (ref <10)
Benzodiazepines: NEGATIVE ng/mL (ref <0.50)
Buprenorphine: NEGATIVE ng/mL (ref <0.10)
Cocaine: NEGATIVE ng/mL (ref <5.0)
Codeine: NEGATIVE ng/mL (ref <2.5)
Cotinine: >250 ng/mL — ABNORMAL HIGH (ref <5.0)
Dihydrocodeine: NEGATIVE ng/mL (ref <2.5)
Fentanyl: NEGATIVE ng/mL (ref <0.10)
Heroin Metabolite: NEGATIVE ng/mL (ref <1.0)
Hydrocodone: 55.9 ng/mL — ABNORMAL HIGH (ref <2.5)
Hydromorphone: NEGATIVE ng/mL (ref <2.5)
MARIJUANA: NEGATIVE ng/mL (ref <2.5)
MDMA: NEGATIVE ng/mL (ref <10)
Meprobamate: NEGATIVE ng/mL (ref <2.5)
Methadone: NEGATIVE ng/mL (ref <5.0)
Morphine: NEGATIVE ng/mL (ref <2.5)
Nicotine Metabolite: POSITIVE ng/mL — AB (ref <5.0)
Norhydrocodone: NEGATIVE ng/mL (ref <2.5)
Noroxycodone: NEGATIVE ng/mL (ref <2.5)
Opiates: POSITIVE ng/mL — AB (ref <2.5)
Oxycodone: NEGATIVE ng/mL (ref <2.5)
Oxymorphone: NEGATIVE ng/mL (ref <2.5)
Phencyclidine: NEGATIVE ng/mL (ref <10)
Tapentadol: NEGATIVE ng/mL (ref <5.0)
Tramadol: NEGATIVE ng/mL (ref <5.0)
Zolpidem: NEGATIVE ng/mL (ref <5.0)

## 2021-11-16 LAB — DRUG TOX ALC METAB W/CON, ORAL FLD: Alcohol Metabolite: NEGATIVE ng/mL (ref <25)

## 2021-11-16 MED ORDER — METOCLOPRAMIDE HCL 5 MG PO TABS: 5.0000 mg | ORAL_TABLET | Freq: Two times a day (BID) | ORAL | 3 refills | Status: DC

## 2021-11-16 NOTE — Telephone Encounter (Signed)
Pt with mild worsening of chronic abdominal pain over the past few hours. Reviewed JMPs 11/09/2021 office note and symptoms seem about the same. She wants to know if she can take an extra dose of Carafate as it helps her abdominal pain.  OK to take 1 additional dose of Carafate tonight. Contact the office during regular business hours if chronic symptoms are not adequately controlled.

## 2021-11-16 NOTE — Telephone Encounter (Signed)
We can try increasing metoclopramide to 10 mg 3 times daily before meals and at bedtime for 48 hours to see if pain improves, if not helpful she should return to the 5 mg twice daily as discussed recently in clinic Abdominal ultrasound is scheduled for tomorrow If pain on abates or worsens she should be seen in the ER while we are waiting to complete outpatient work-up

## 2021-11-16 NOTE — Telephone Encounter (Signed)
Spoke with pt. Pt reports she has abd pain 10/10 that comes and goes and is in both upper and lower abd. Pt reports that the pain sometimes happens when she is having a bowel movement. Asked pt is anything helps pain, which pt reported warm baths help the pain some. Pt also reports she has a bowel movement every time she eats. Pt states she has 2-3 bm's per day on average and states that stool is sometimes loose.

## 2021-11-16 NOTE — Telephone Encounter (Signed)
Patient called in states she have been in bed all day experiencing severe abd pain. States she take additional dose of Carafate and it is not working for her

## 2021-11-16 NOTE — Telephone Encounter (Signed)
Left message for pt to call back  °

## 2021-11-17 ENCOUNTER — Ambulatory Visit (HOSPITAL_COMMUNITY): Admission: RE | Admit: 2021-11-17 | Payer: Medicaid Other | Source: Ambulatory Visit

## 2021-11-17 NOTE — Telephone Encounter (Signed)
Pt returned call. Let pt know Dr. Lauro Franklin recommendations to increase metoclopramide to 10 mg 3 times daily before meals and at bedtime for 48 hrs to see if pain improves and if not helpful pt should return to 5 mg twice daily as discussed in clinic. Pt also stated she thought abd ultrasound was scheduled for Friday. Let pt know that it was today at 8 am but that she can call and reschedule. Gave pt number to call to reschedule. Also let pt know that if pain worsens she should go to ER while we are waiting to complete outpatient work-up. Pt verbalized understanding.

## 2021-11-18 ENCOUNTER — Encounter: Payer: Self-pay | Admitting: Obstetrics and Gynecology

## 2021-11-18 ENCOUNTER — Telehealth: Payer: Self-pay | Admitting: Orthopedic Surgery

## 2021-11-18 ENCOUNTER — Telehealth: Payer: Self-pay | Admitting: *Deleted

## 2021-11-18 ENCOUNTER — Telehealth: Payer: Self-pay | Admitting: Lactation Services

## 2021-11-18 NOTE — Telephone Encounter (Signed)
Oral swab drug screen was consistent for prescribed medications.  ?

## 2021-11-18 NOTE — Telephone Encounter (Signed)
Pt called and states she has not worked in 4 years due to injury. She needs a note stating that she can not work due to her finger.   CB [id number]

## 2021-11-18 NOTE — Telephone Encounter (Signed)
Patient sent My Chart message that she was having difficulty getting prescription refilled.   Called [facility] to determine issue with Patient getting Norethindrone refilled. Was transferred to dispensing pharmacy.   Was informed by patients pharmacy. He reports patient had a prescription called on on 12/16 and 2/19 by Dr. Jolayne Panther for 270 tablets, due to insurance not showing to Max dosage of 3 tablets a day which is limiting her getting the prescription filled without a Prior Auth, PA was faxed to office yesterday.   Per Pharmacist patient picked up 270 tablets on 2/18. She was informed on 2/20 by the office to decrease dosage to 10 mg BID so should have enough pills left at this time.   Per Pharmacist can get refilled on 2/5. She does need PA completed. He has sent to the office.   Called patient to discuss if she has any medication left. She did not answer. LM for her to check her My Chart message.

## 2021-11-22 ENCOUNTER — Telehealth: Payer: Self-pay | Admitting: Lactation Services

## 2021-11-22 ENCOUNTER — Encounter: Payer: Self-pay | Admitting: Lactation Services

## 2021-11-22 NOTE — Telephone Encounter (Signed)
Called Shannon at [facility] at [phone] to discuss denied PA. She reports that all PA's from Nov, Dec, and Feb were all denied. PA was filled out 2/7 and was denied " as not prescribed per manufacturer recommendations" She then transferred me to Peer to Peer Pharmacist to discuss and try to get approval.   Peer to Peer Pharmacist was not able to assist as she is not located in [place], she took name and number and is to have a [place] Peer to Peer Pharmacist.   Per Pharmacist last week the prescription that was written in December for 270 tablets was changed to a 90 day prescription and was able to be filled at that time. She is not able to get a prescription refilled at this time.   Per patients Provider, he would like for patient to decrease to 4 tablets a day and to follow up in the office. A new prescription was written.   In speaking with the patient ,she has a few pills left but is running out.

## 2021-11-22 NOTE — Telephone Encounter (Signed)
Called and spoke with Pharmacy and informed them that Norethindrone PA was approved and asked for them to get the medication ready for patient. Pharmacist ran prescription and prescription went through. Will send My Chart message to patient.

## 2021-11-22 NOTE — Telephone Encounter (Signed)
Rosa, a [place] Peer to Peer Pharmacist called back to discuss denied PA. All pertinent information given. Approval receivedwith confirmation # [id number], active for 11/22/2021-11/22/2022

## 2021-11-24 ENCOUNTER — Telehealth: Payer: Medicaid Other | Admitting: Nurse Practitioner

## 2021-11-24 ENCOUNTER — Ambulatory Visit: Payer: Medicaid Other | Admitting: Pulmonary Disease

## 2021-11-24 NOTE — Progress Notes (Incomplete)
Subjective:   PATIENT ID: [name] GENDER: female DOB: 11-02-1975, MRN: [id number]   HPI  No chief complaint on file.   Reason for Visit: New patient  [name] is a 46 year old female former smoker with COPD, asthma, chronic diastolic heart failure who presents for shortness of breath.  She is short of breath with walking only a few steps. Washing dishes can also be difficult. She is unable to walk up to steps. Talking can sometimes difficult. She reports >10 lb weight gain in the last week. She is taking lasix. She has wheezing and coughing. Endorses orthopnea. Denies recent steroids and states she does not take steroids in general due to nausea, wheezing. She is compliance with Symbicort and Spiriva. She needs albuterol 3-4 times a day and is using her nebulizer three times a day. Denies fevers, chills.  She reports poor sleep schedule. She has difficulty falling asleep. Sometimes has insomnia. When she does sleep, she has snoring and nighttime awakenings  Asthma Control Test ACT Total Score  07/18/2021 4   Social History: Previously worked in Nurse, children's Quit smoking March 2022. Smokes 1/2 ppd x 20 years. No vaping  I have personally reviewed patient's past medical/family/social history, allergies, current medications.  Past Medical History:  Diagnosis Date   Anemia    Arthritis    knees, hands   Asthma    Chronic diastolic (congestive) heart failure (HCC)    COPD (chronic obstructive pulmonary disease) (HCC)    Diabetes mellitus without complication (HCC)    type 2   DKA (diabetic ketoacidosis) ([place]) 10/15/2020   Dysfunctional uterine bleeding    Gastritis    GERD (gastroesophageal reflux disease)    Hypertension    Neuromuscular disorder (HCC)    neuropathy feet   Seizures ([place]) 09/12/2017   pt states r/t stress and blood sugar - no meds last one 4 months ago, not seen neurologist   Sickle cell trait  ([place])    Smoker    Vitamin D deficiency 10/2019   Wears glasses      Family History  Problem Relation Age of Onset   Diabetes Mother    Hypertension Mother    Migraines Paternal Grandfather    Colon cancer Neg Hx    Esophageal cancer Neg Hx    Stomach cancer Neg Hx    Rectal cancer Neg Hx      Social History   Occupational History   Occupation: unemployed  Tobacco Use   Smoking status: Some Days    Packs/day: 0.25    Years: 26.00    Pack years: 6.50    Types: Cigarettes    Last attempt to quit: 09/13/2020    Years since quitting: 1.1   Smokeless tobacco: Never   Tobacco comments:    4-5 cigarettes/day  Vaping Use   Vaping Use: Never used  Substance and Sexual Activity   Alcohol use: No   Drug use: No   Sexual activity: Not Currently    Birth control/protection: None    Allergies  Allergen Reactions   Amitriptyline Other (See Comments)    Coma   Ketoprofen Nausea And Vomiting   Trazodone Nausea And Vomiting   Aspirin Nausea Only   Gabapentin Nausea And Vomiting and Other (See Comments)    upset stomach   Ibuprofen Nausea And Vomiting   Liraglutide Nausea And Vomiting   Naproxen Nausea And Vomiting   Omeprazole-Sodium Bicarbonate Nausea And Vomiting   Sulfa  Antibiotics Nausea And Vomiting   Tramadol Nausea And Vomiting and Other (See Comments)    stomach upset     Outpatient Medications Prior to Visit  Medication Sig Dispense Refill   Accu-Chek Softclix Lancets lancets USE AS DIRECTED UP TO 4 TIMES DAILY. 100 each 3   albuterol (PROVENTIL) (2.5 MG/3ML) 0.083% nebulizer solution Take 3 mLs (2.5 mg total) by nebulization every 6 (six) hours as needed for wheezing or shortness of breath. 150 mL 6   albuterol (VENTOLIN HFA) 108 (90 Base) MCG/ACT inhaler Inhale 2 puffs into the lungs every 6 (six) hours as needed for wheezing or shortness of breath. 8 g 6   AMBULATORY NON FORMULARY MEDICATION Medication Name: GI cocktail  (viscous lidocaine, dicyclomine, maalox) Swish and swallow 30 mL 2x per day 450 mL 0   blood glucose meter kit and supplies KIT 1 each by Other route See admin instructions. Dispense based on patient and insurance preference. Use up to four times daily as directed. (FOR ICD-9 250.00, 250.01). 1 each 2   Blood Pressure Monitoring (ADULT BLOOD PRESSURE CUFF LG) KIT 1 kit by Does not apply route daily. 1 kit 0   budesonide-formoterol (SYMBICORT) 160-4.5 MCG/ACT inhaler INHALE 2 PUFFS INTO THE LUNGS 2 (TWO) TIMES DAILY. 10.2 g 6   cetirizine (ZYRTEC) 10 MG tablet Take 1 tablet (10 mg total) by mouth daily. 30 tablet 3   Continuous Blood Gluc Sensor (DEXCOM G6 SENSOR) MISC 1 Device by Does not apply route as directed. 9 each 3   Continuous Blood Gluc Transmit (DEXCOM G6 TRANSMITTER) MISC 1 Device by Does not apply route as directed. 1 each 3   Dexlansoprazole (DEXILANT) 30 MG capsule DR Take 1 capsule (30 mg total) by mouth daily. 30 capsule 11   diclofenac Sodium (VOLTAREN) 1 % GEL Apply 2 g topically 4 (four) times daily. 240 g 0   empagliflozin (JARDIANCE) 25 MG TABS tablet Take 1 tablet (25 mg total) by mouth daily before breakfast. 90 tablet 3   ferrous sulfate 325 (65 FE) MG tablet Take 1 tablet (325 mg total) by mouth 3 (three) times daily with meals. 270 tablet 3   fluticasone (FLONASE) 50 MCG/ACT nasal spray Place 2 sprays into both nostrils daily. 16 g 6   furosemide (LASIX) 40 MG tablet TAKE 1 TABLET(40MG TOTAL) BY MOUTH TWICE DAILY 180 tablet 1   Glucosamine Sulfate 1000 MG CAPS Take 1 capsule (1,000 mg total) by mouth 2 (two) times daily. (Patient taking differently: Take 1,000 mg by mouth 2 (two) times daily.) 180 capsule 3   glucose blood (ACCU-CHEK GUIDE) test strip USE AS DIRECTED UP TO FOUR TIMES DAILY 100 strip 4   haloperidol (HALDOL) 5 MG tablet Take 0.5-1 tablets (2.5-5 mg total) by mouth every 8 (eight) hours as needed (For nausea/vomiting). 15 tablet 0   Heating  Pads (HEATING PAD MOIST/DRY KING SZ) PADS 1 each by Does not apply route 4 (four) times daily as needed. 120 each 1   hydrochlorothiazide (MICROZIDE) 12.5 MG capsule TAKE 1 CAPSULE(12.5 MG) BY MOUTH DAILY 30 capsule 3   HYDROcodone-acetaminophen (NORCO) 10-325 MG tablet Take 1 tablet by mouth 3 (three) times daily as needed. Do Not Fill Before 11/17/2021 90 tablet 0   hydrocortisone 2.5 % cream Apply topically 2 (two) times daily. (Patient taking differently: Apply 1 application topically 2 (two) times daily.) 30 g 11   hydroquinone 4 % cream APPLY TOPICALLY TWICE DAILY (Patient taking differently: Apply 1 application topically 2 (two)  times daily as needed (pain).) 28.35 g 0   hydrOXYzine (ATARAX) 25 MG tablet Take 1 tablet (25 mg total) by mouth every 8 (eight) hours as needed. Anxiety 60 tablet 0   hydrOXYzine (VISTARIL) 50 MG capsule Take 1 capsule (50 mg total) by mouth 3 (three) times daily. 90 capsule 5   insulin glargine (LANTUS SOLOSTAR) 100 UNIT/ML Solostar Pen Inject 110 Units into the skin daily. 105 mL 3   insulin lispro (HUMALOG KWIKPEN) 200 UNIT/ML KwikPen Max daily 180 units 90 mL 3   Insulin Pen Needle 31G X 8 MM MISC 1 Device by Does not apply route in the morning, at noon, in the evening, and at bedtime. 400 each 3   isosorbide mononitrate (IMDUR) 30 MG 24 hr tablet Take 1 tablet (30 mg total) by mouth daily. 90 tablet 3   losartan (COZAAR) 100 MG tablet TAKE 1 TABLET BY MOUTH EVERY DAY 30 tablet 3   metoCLOPramide (REGLAN) 5 MG tablet Take 1 tablet (5 mg total) by mouth every 12 (twelve) hours. 60 tablet 3   metoprolol succinate (TOPROL-XL) 100 MG 24 hr tablet Take 100 mg by mouth daily. Take with or immediately following a meal.     norethindrone (AYGESTIN) 5 MG tablet Take 2 tablets (10 mg total) by mouth 2 (two) times daily as needed. 120 tablet 2   ondansetron (ZOFRAN) 4 MG tablet Take 1 tablet (4 mg total) by mouth every 8 (eight) hours as needed for nausea or  vomiting. 30 tablet 1   potassium chloride SA (KLOR-CON) 20 MEQ tablet TAKE 1 TABLET(20 MEQ) BY MOUTH DAILY (Patient taking differently: Take 20 mEq by mouth daily.) 30 tablet 6   predniSONE (STERAPRED UNI-PAK 21 TAB) 5 MG (21) TBPK tablet Take as directed 21 tablet 0   rosuvastatin (CRESTOR) 20 MG tablet Take 1 tablet (20 mg total) by mouth daily. 30 tablet 3   saxagliptin HCl (ONGLYZA) 5 MG TABS tablet Take 1 tablet (5 mg total) by mouth daily. 90 tablet 3   spironolactone (ALDACTONE) 50 MG tablet TAKE 1 TABLET(50 MG) BY MOUTH DAILY (Patient taking differently: Take 50 mg by mouth daily.) 30 tablet 3   sucralfate (CARAFATE) 1 g tablet Take 1 tablet (1 g total) by mouth 4 (four) times daily -  with meals and at bedtime. 90 tablet 0   sucralfate (CARAFATE) 1 GM/10ML suspension Take 10 mLs (1 g total) by mouth 4 (four) times daily. 420 mL 1   Tiotropium Bromide Monohydrate (SPIRIVA RESPIMAT) 2.5 MCG/ACT AERS Inhale 2 puffs into the lungs daily. 4 g 6   tiZANidine (ZANAFLEX) 4 MG tablet Take 1.5 tablets (6 mg total) by mouth every 6 (six) hours as needed for muscle spasms. 120 tablet 3   TOBRADEX ophthalmic solution Place 1 drop into the right eye 4 (four) times daily.     trifluoperazine (STELAZINE) 1 MG tablet Take by mouth.     vitamin E (VITAMIN E) 180 MG (400 UNITS) capsule Take 1 capsule (400 Units total) by mouth daily. 30 capsule 1   No facility-administered medications prior to visit.    Review of Systems  Constitutional:  Positive for malaise/fatigue. Negative for chills, diaphoresis, fever and weight loss.  HENT:  Negative for congestion.   Respiratory:  Positive for shortness of breath. Negative for cough, hemoptysis, sputum production and wheezing.   Cardiovascular:  Negative for chest pain, palpitations and leg swelling.    Objective:   There were no vitals filed for this  visit.     There is no height or weight on file to calculate BMI.  Physical  Exam: General: Well-appearing, no acute distress HENT: [place], AT Eyes: EOMI, no scleral icterus Respiratory: Clear to auscultation bilaterally.  No crackles, wheezing or rales Cardiovascular: RRR, -M/R/G, no JVD Extremities:-Edema,-tenderness Neuro: AAO x4, CNII-XII grossly intact Psych: Normal mood, normal affect  Data Reviewed:  PFT: FVC (L) 2.31 2.43 -1.92 72 2.41 76 0.10 +4 FEV1 (L) 1.78 1.96 -2.13 68 1.58 61 -0.20 -11 FEV1/FVC (%) 77 72 -1 92 66 78 -14 Expiratory Time (sec) 5.85 6.65 +13 TestGrade(ATS) AA AA ---- Slow Vital Capacity --- SVC (L) 2.52 2.43 -1.45 79 IC (L) 2.07 89 ERV (L) 0.45 52 FEV1/SVC (%) 71 86 ---- LUNG VOLUMES ---- TLC ([place]) (L) 4.35 4.13 -1.60 83 TGV (L) 2.28 1.85 -1.17 78 RV ([place]) (L) 1.83 0.91 0.41 109 RV/TLC ([place]) (%) 42 21 2 132 ---- DIFFUSION ---- DLCOunc (ml/min/mmHg) 19.24 17.40 -0.98 85 DLCOcor (ml/min/mmHg) 17.40 VA (L) 3.63 4.19 -2.77 70 TLC (SB) (L) 3.78 Kco (ml/min/mmHg/L) 5.30 3.46 1.33 119 DLCO ATS GRADES AA     Assessment & Plan:   Discussion: 46 year old female with morbid obesity, COPD/asthma overlap and chronic diastolic heart failure who presents as a new patient to me. She is seen by Dr. Valeta Harms. Reports shortness of breath which is multifactorial in setting deconditioning, suspected sleep apnea and asthma/COPD. Vital signs reviewed and euvolemic on exam today.   Shortness of breath --CXR today  Moderate persistent asthma/COPD (FEV1 61%) GOLD Class B/D --CONTINUE Symbicort TWO puffs TWICE a day. REFILL --CONTINUE Spiriva TWO puffs ONCE a day. REFILL --CONTINUE Albuterol handheld AS NEEDED. REFILL --CONTINUE Albuterol nebulizer AS NEEDED. REFILL --REFER to pulmonary rehab. Patient has [facility] IV symptoms.  Insomnia --Refer to sleep clinic --ORDER split night sleep study  Health Maintenance Immunization History  Administered Date(s) Administered   Influenza,inj,Quad PF,6+ Mos 09/03/2018, 07/03/2019, 07/04/2020,  07/01/2021   Pneumococcal Polysaccharide-23 03/28/2015, 07/04/2020   Tdap 03/27/2016   Unspecified SARS-COV-2 Vaccination 03/25/2020, 05/05/2020   CT Lung Screen - Not qualified  No orders of the defined types were placed in this encounter.  No orders of the defined types were placed in this encounter.   No follow-ups on file. Arrange follow-up with Dr. Valeta Harms in 3 months  I have spent a total time of 39-minutes on the day of the appointment reviewing prior documentation, coordinating care and discussing medical diagnosis and plan with the patient/family. Imaging, labs and tests included in this note have been reviewed and interpreted independently by me.  Garner Nash, MD [facility] 11/24/2021 10:42 AM  Office Number [phone]

## 2021-11-28 ENCOUNTER — Other Ambulatory Visit: Payer: Self-pay

## 2021-11-28 ENCOUNTER — Telehealth: Payer: Self-pay

## 2021-11-28 ENCOUNTER — Encounter: Payer: Self-pay | Admitting: Internal Medicine

## 2021-11-28 ENCOUNTER — Ambulatory Visit (HOSPITAL_COMMUNITY)
Admission: RE | Admit: 2021-11-28 | Discharge: 2021-11-28 | Disposition: A | Discharge status: Home / Self Care | Payer: Medicaid Other | Source: Ambulatory Visit | Attending: Internal Medicine | Admitting: Internal Medicine

## 2021-11-28 ENCOUNTER — Other Ambulatory Visit (HOSPITAL_COMMUNITY): Payer: Self-pay

## 2021-11-28 DIAGNOSIS — K824 Cholesterolosis of gallbladder: Secondary | ICD-10-CM | POA: Diagnosis not present

## 2021-11-28 DIAGNOSIS — K760 Fatty (change of) liver, not elsewhere classified: Secondary | ICD-10-CM | POA: Diagnosis not present

## 2021-11-28 DIAGNOSIS — R109 Unspecified abdominal pain: Secondary | ICD-10-CM | POA: Diagnosis not present

## 2021-11-28 NOTE — Telephone Encounter (Signed)
Please let her know that HIDA scan with CCK is recommended rather than MRI Evaluate gallbladder dysfunction/biliary dyskinesia

## 2021-11-28 NOTE — Telephone Encounter (Signed)
Patient Advocate Encounter   Received notification from [facility] that prior authorization for Dexcom G6 sensors is required by his/her insurance [employer].   PA submitted on 11/28/21  Key#: [id number]  Status is pending    [facility] will continue to follow:  Patient Advocate Fax: [phone]

## 2021-11-29 ENCOUNTER — Other Ambulatory Visit (HOSPITAL_COMMUNITY): Payer: Self-pay

## 2021-11-29 ENCOUNTER — Other Ambulatory Visit: Payer: Self-pay

## 2021-11-29 DIAGNOSIS — R109 Unspecified abdominal pain: Secondary | ICD-10-CM

## 2021-11-29 NOTE — Telephone Encounter (Signed)
Patient Advocate Encounter  Prior Authorization for [employer] has been approved.    PA# [id number]  Effective dates: 11/28/21 through 11/28/22  Per Test Claim Patients co-pay is $0.   Spoke with Pharmacy to Process.  Patient Advocate Fax: [phone]

## 2021-11-30 ENCOUNTER — Telehealth: Payer: Self-pay | Admitting: Internal Medicine

## 2021-11-30 NOTE — Telephone Encounter (Signed)
Pt notified that Dr. Rhea Belton wanted to see how well her gallbladder is functioning and an MRI is not the test to show him that, the HIDA scan is what is needed.

## 2021-11-30 NOTE — Telephone Encounter (Signed)
Patient called requesting to speak with you regarding an MRI she wasn't able to do and she is asking why.

## 2021-12-01 DIAGNOSIS — I10 Essential (primary) hypertension: Secondary | ICD-10-CM | POA: Diagnosis not present

## 2021-12-01 DIAGNOSIS — Z6841 Body Mass Index (BMI) 40.0 and over, adult: Secondary | ICD-10-CM | POA: Diagnosis not present

## 2021-12-01 DIAGNOSIS — M5440 Lumbago with sciatica, unspecified side: Secondary | ICD-10-CM | POA: Diagnosis not present

## 2021-12-06 ENCOUNTER — Encounter: Payer: Self-pay | Admitting: Orthopaedic Surgery

## 2021-12-06 ENCOUNTER — Other Ambulatory Visit: Payer: Self-pay

## 2021-12-06 ENCOUNTER — Ambulatory Visit (INDEPENDENT_AMBULATORY_CARE_PROVIDER_SITE_OTHER): Payer: Medicaid Other | Admitting: Orthopaedic Surgery

## 2021-12-06 DIAGNOSIS — M549 Dorsalgia, unspecified: Secondary | ICD-10-CM | POA: Diagnosis not present

## 2021-12-06 DIAGNOSIS — M545 Low back pain, unspecified: Secondary | ICD-10-CM | POA: Diagnosis not present
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
## 2021-12-08 ENCOUNTER — Other Ambulatory Visit: Payer: Self-pay

## 2021-12-08 ENCOUNTER — Ambulatory Visit: Payer: Medicaid Other

## 2021-12-08 ENCOUNTER — Ambulatory Visit (INDEPENDENT_AMBULATORY_CARE_PROVIDER_SITE_OTHER): Payer: Medicaid Other | Admitting: Obstetrics and Gynecology

## 2021-12-08 VITALS — BP 112/57 | HR 77 | Wt 276.4 lb

## 2021-12-08 DIAGNOSIS — Z3042 Encounter for surveillance of injectable contraceptive: Secondary | ICD-10-CM | POA: Diagnosis not present

## 2021-12-08 DIAGNOSIS — Z1231 Encounter for screening mammogram for malignant neoplasm of breast: Secondary | ICD-10-CM

## 2021-12-08 DIAGNOSIS — Z5941 Food insecurity: Secondary | ICD-10-CM

## 2021-12-08 DIAGNOSIS — N939 Abnormal uterine and vaginal bleeding, unspecified: Secondary | ICD-10-CM

## 2021-12-08 MED ORDER — MEDROXYPROGESTERONE ACETATE 150 MG/ML IM SUSP
150.0000 mg | Freq: Once | INTRAMUSCULAR | Status: AC
  Administered 2021-12-08: 150 mg via INTRAMUSCULAR

## 2021-12-08 NOTE — Progress Notes (Signed)
[facility]. Patient notified.

## 2021-12-08 NOTE — Progress Notes (Signed)
Patient has elevated PHW9. Declines B[facility].
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
## 2021-12-09 ENCOUNTER — Ambulatory Visit (INDEPENDENT_AMBULATORY_CARE_PROVIDER_SITE_OTHER): Payer: Medicaid Other | Admitting: Nurse Practitioner

## 2021-12-09 ENCOUNTER — Encounter: Payer: Self-pay | Admitting: Nurse Practitioner

## 2021-12-09 VITALS — BP 139/67 | HR 85 | Temp 98.1°F | Ht 64.0 in | Wt 278.6 lb

## 2021-12-09 DIAGNOSIS — Z794 Long term (current) use of insulin: Secondary | ICD-10-CM | POA: Diagnosis not present

## 2021-12-09 DIAGNOSIS — R29818 Other symptoms and signs involving the nervous system: Secondary | ICD-10-CM

## 2021-12-09 DIAGNOSIS — E785 Hyperlipidemia, unspecified: Secondary | ICD-10-CM

## 2021-12-09 DIAGNOSIS — E119 Type 2 diabetes mellitus without complications: Secondary | ICD-10-CM

## 2021-12-09 DIAGNOSIS — I10 Essential (primary) hypertension: Secondary | ICD-10-CM

## 2021-12-09 LAB — POCT GLYCOSYLATED HEMOGLOBIN (HGB A1C)
HbA1c POC (<> result, manual entry): 8.7 % (ref 4.0–5.6)
HbA1c, POC (controlled diabetic range): 8.7 % — AB (ref 0.0–7.0)
HbA1c, POC (prediabetic range): 8.7 % — AB (ref 5.7–6.4)
Hemoglobin A1C: 8.7 % — AB (ref 4.0–5.6)

## 2021-12-09 NOTE — Progress Notes (Signed)
[address] [place], [place]  [address] Phone:  [phone]   Fax:  [phone]    Established Patient Office Visit  Subjective:  Patient ID: Jill Shaw, female    DOB: [birth year]  Age: 46 y.o. MRN: [id number]  CC:  Chief Complaint  Patient presents with   Follow-up    Pt is here today for her follow up visit. Pt states that she feels she has been having signs of a stroke but her cardiologist was telling her she wasn't. Pt symptoms- dizziness, mouth twitching, elevated blood pressure, syncope, slurred speech, tingling and numbness in hands and feet.  Pt has never been seen by a neurologist.      HPI   Jill Shaw is a 46 y.o. female who presents for follow up of dizziness/ syncope, spells of neurologic dysfunction, and mouth twitching and slurred speech . Event occurred several months ago. She has residual symptoms of slurred speech,mouth twitching to the right numbness or tingling of feet; hands, finger, legs and feet; with weakness, and tremor of hands. She has a history of swallowing difficulties.  She denies facial droop. Overall she feels her condition is gradually worsening. Stroke risk factors include: diabetes mellitus, hyperlipidemia, hypertension, and smoking. She does have periods of confusion and lethargy.   Past Medical History:  Diagnosis Date   Anemia    Arthritis    knees, hands   Asthma    Chronic diastolic (congestive) heart failure (HCC)    COPD (chronic obstructive pulmonary disease) (HCC)    Diabetes mellitus without complication ([place])    type 2   DKA (diabetic ketoacidosis) ([place]) 10/15/2020   Dysfunctional uterine bleeding    Gastritis    GERD (gastroesophageal reflux disease)    Hypertension    Neuromuscular disorder (HCC)    neuropathy feet   Seizures ([place]) 09/12/2017   pt states r/t stress and blood sugar - no meds last one 4 months ago, not seen neurologist   Sickle cell trait ([place])    Smoker     Vitamin D deficiency 10/2019   Wears glasses     Past Surgical History:  Procedure Laterality Date   CESAREAN SECTION     x 1. for twins   DILATION AND CURETTAGE OF UTERUS N/A 08/20/2019   Procedure: DILATATION AND CURETTAGE;  Surgeon: Emily Filbert, MD;  Location: [facility];  Service: Gynecology;  Laterality: N/A;   ENDOMETRIAL ABLATION N/A 08/20/2019   Procedure: [place] Ablation;  Surgeon: Emily Filbert, MD;  Location: [facility];  Service: Gynecology;  Laterality: N/A;   EYE SURGERY Bilateral    laser right and cataract removed left eye   LEFT HEART CATH AND CORONARY ANGIOGRAPHY N/A 08/30/2021   Procedure: LEFT HEART CATH AND CORONARY ANGIOGRAPHY;  Surgeon: Nigel Mormon, MD;  Location: [facility];  Service: Cardiovascular;  Laterality: N/A;   RADIOLOGY WITH ANESTHESIA N/A 09/16/2019   Procedure: MRI WITH ANESTHESIA   L SPINE WITHOUT CONTRAST, T SPINE WITHOUT CONTRAST , CERVICAL WITHOUT CONTRAST;  Surgeon: Radiologist, Medication, MD;  Location: [facility];  Service: Radiology;  Laterality: N/A;   TUBAL LIGATION     interval BTL   UPPER GI ENDOSCOPY  07/2017    Family History  Problem Relation Age of Onset   Diabetes Mother    Hypertension Mother    Migraines Paternal Grandfather    Colon cancer Neg Hx    Esophageal cancer Neg Hx  Stomach cancer Neg Hx    Rectal cancer Neg Hx     Social History   Socioeconomic History   Marital status: Legally Separated    Spouse name: Not on file   Number of children: 3   Years of education: Not on file   Highest education level: Not on file  Occupational History   Occupation: unemployed  Tobacco Use   Smoking status: Some Days    Packs/day: 0.25    Years: 26.00    Pack years: 6.50    Types: Cigarettes    Last attempt to quit: 09/13/2020    Years since quitting: 1.2   Smokeless tobacco: Never   Tobacco comments:    4-5 cigarettes/day  Vaping Use   Vaping Use: Never used  Substance and Sexual Activity   Alcohol use: No    Drug use: No   Sexual activity: Not Currently    Birth control/protection: None  Other Topics Concern   Not on file  Social History Narrative   Right Handed   Lives in a one story apartment, but lives on the second floor   Drinks caffeine once in awhile   Social Determinants of Health   Financial Resource Strain: Not on file  Food Insecurity: Food Insecurity Present   Worried About [place] in the Last Year: Never true   [place] in the Last Year: Sometimes true  Transportation Needs: No Transportation Needs   Lack of Transportation (Medical): No   Lack of Transportation (Non-Medical): No  Physical Activity: Not on file  Stress: Not on file  Social Connections: Not on file  Intimate Partner Violence: Not on file    Outpatient Medications Prior to Visit  Medication Sig Dispense Refill   Accu-Chek Softclix Lancets lancets USE AS DIRECTED UP TO 4 TIMES DAILY. 100 each 3   albuterol (PROVENTIL) (2.5 MG/3ML) 0.083% nebulizer solution Take 3 mLs (2.5 mg total) by nebulization every 6 (six) hours as needed for wheezing or shortness of breath. 150 mL 6   albuterol (VENTOLIN HFA) 108 (90 Base) MCG/ACT inhaler Inhale 2 puffs into the lungs every 6 (six) hours as needed for wheezing or shortness of breath. 8 g 6   blood glucose meter kit and supplies KIT 1 each by Other route See admin instructions. Dispense based on patient and insurance preference. Use up to four times daily as directed. (FOR ICD-9 250.00, 250.01). 1 each 2   Blood Pressure Monitoring (ADULT BLOOD PRESSURE CUFF LG) KIT 1 kit by Does not apply route daily. 1 kit 0   budesonide-formoterol (SYMBICORT) 160-4.5 MCG/ACT inhaler INHALE 2 PUFFS INTO THE LUNGS 2 (TWO) TIMES DAILY. 10.2 g 6   cetirizine (ZYRTEC) 10 MG tablet Take 1 tablet (10 mg total) by mouth daily. 30 tablet 3   Continuous Blood Gluc Sensor (DEXCOM G6 SENSOR) MISC 1 Device by Does not apply route as directed. 9 each 3   Continuous Blood Gluc  Transmit (DEXCOM G6 TRANSMITTER) MISC 1 Device by Does not apply route as directed. 1 each 3   Dexlansoprazole (DEXILANT) 30 MG capsule DR Take 1 capsule (30 mg total) by mouth daily. 30 capsule 11   empagliflozin (JARDIANCE) 25 MG TABS tablet Take 1 tablet (25 mg total) by mouth daily before breakfast. 90 tablet 3   fluticasone (FLONASE) 50 MCG/ACT nasal spray Place 2 sprays into both nostrils daily. 16 g 6   furosemide (LASIX) 40 MG tablet TAKE 1 TABLET($RemoveBefor'40MG'HkKmGqoiNryU$  TOTAL) BY MOUTH TWICE  DAILY 180 tablet 1   Glucosamine Sulfate 1000 MG CAPS Take 1 capsule (1,000 mg total) by mouth 2 (two) times daily. (Patient taking differently: Take 1,000 mg by mouth 2 (two) times daily.) 180 capsule 3   glucose blood (ACCU-CHEK GUIDE) test strip USE AS DIRECTED UP TO FOUR TIMES DAILY 100 strip 4   haloperidol (HALDOL) 5 MG tablet Take 0.5-1 tablets (2.5-5 mg total) by mouth every 8 (eight) hours as needed (For nausea/vomiting). 15 tablet 0   Heating Pads (HEATING PAD MOIST/DRY Story Conti SZ) PADS 1 each by Does not apply route 4 (four) times daily as needed. 120 each 1   hydrochlorothiazide (MICROZIDE) 12.5 MG capsule TAKE 1 CAPSULE(12.5 MG) BY MOUTH DAILY 30 capsule 3   HYDROcodone-acetaminophen (NORCO) 10-325 MG tablet Take 1 tablet by mouth 3 (three) times daily as needed. Do Not Fill Before 11/17/2021 90 tablet 0   hydrocortisone 2.5 % cream Apply topically 2 (two) times daily. (Patient taking differently: Apply 1 application topically 2 (two) times daily.) 30 g 11   hydroquinone 4 % cream APPLY TOPICALLY TWICE DAILY (Patient taking differently: Apply 1 application topically 2 (two) times daily as needed (pain).) 28.35 g 0   hydrOXYzine (ATARAX) 25 MG tablet Take 1 tablet (25 mg total) by mouth every 8 (eight) hours as needed. Anxiety 60 tablet 0   hydrOXYzine (VISTARIL) 50 MG capsule Take 1 capsule (50 mg total) by mouth 3 (three) times daily. 90 capsule 5   insulin glargine (LANTUS SOLOSTAR) 100 UNIT/ML Solostar Pen  Inject 110 Units into the skin daily. 105 mL 3   insulin lispro (HUMALOG KWIKPEN) 200 UNIT/ML KwikPen Max daily 180 units 90 mL 3   Insulin Pen Needle 31G X 8 MM MISC 1 Device by Does not apply route in the morning, at noon, in the evening, and at bedtime. 400 each 3   losartan (COZAAR) 100 MG tablet TAKE 1 TABLET BY MOUTH EVERY DAY 30 tablet 3   metoCLOPramide (REGLAN) 5 MG tablet Take 1 tablet (5 mg total) by mouth every 12 (twelve) hours. 60 tablet 3   metoprolol succinate (TOPROL-XL) 100 MG 24 hr tablet Take 100 mg by mouth daily. Take with or immediately following a meal.     ondansetron (ZOFRAN) 4 MG tablet Take 1 tablet (4 mg total) by mouth every 8 (eight) hours as needed for nausea or vomiting. 30 tablet 1   potassium chloride SA (KLOR-CON) 20 MEQ tablet TAKE 1 TABLET(20 MEQ) BY MOUTH DAILY (Patient taking differently: Take 20 mEq by mouth daily.) 30 tablet 6   rosuvastatin (CRESTOR) 20 MG tablet Take 1 tablet (20 mg total) by mouth daily. 30 tablet 3   spironolactone (ALDACTONE) 50 MG tablet TAKE 1 TABLET(50 MG) BY MOUTH DAILY (Patient taking differently: Take 50 mg by mouth daily.) 30 tablet 3   sucralfate (CARAFATE) 1 g tablet Take 1 tablet (1 g total) by mouth 4 (four) times daily -  with meals and at bedtime. 90 tablet 0   sucralfate (CARAFATE) 1 GM/10ML suspension Take 10 mLs (1 g total) by mouth 4 (four) times daily. 420 mL 1   Tiotropium Bromide Monohydrate (SPIRIVA RESPIMAT) 2.5 MCG/ACT AERS Inhale 2 puffs into the lungs daily. 4 g 6   tiZANidine (ZANAFLEX) 4 MG tablet Take 1.5 tablets (6 mg total) by mouth every 6 (six) hours as needed for muscle spasms. 120 tablet 3   TOBRADEX ophthalmic solution Place 1 drop into the right eye 4 (four) times daily.  isosorbide mononitrate (IMDUR) 30 MG 24 hr tablet Take 1 tablet (30 mg total) by mouth daily. 90 tablet 3   norethindrone (AYGESTIN) 5 MG tablet Take 2 tablets (10 mg total) by mouth 2 (two) times daily as needed. 120 tablet 2    predniSONE (STERAPRED UNI-PAK 21 TAB) 5 MG (21) TBPK tablet Take as directed (Patient not taking: Reported on 12/09/2021) 21 tablet 0   saxagliptin HCl (ONGLYZA) 5 MG TABS tablet Take 1 tablet (5 mg total) by mouth daily. 90 tablet 3   trifluoperazine (STELAZINE) 1 MG tablet Take by mouth. (Patient not taking: Reported on 12/08/2021)     No facility-administered medications prior to visit.    Allergies  Allergen Reactions   Amitriptyline Other (See Comments)    Coma   Ketoprofen Nausea And Vomiting   Trazodone Nausea And Vomiting   Aspirin Nausea Only   Gabapentin Nausea And Vomiting and Other (See Comments)    upset stomach   Ibuprofen Nausea And Vomiting   Liraglutide Nausea And Vomiting   Naproxen Nausea And Vomiting   Omeprazole-Sodium Bicarbonate Nausea And Vomiting   Sulfa Antibiotics Nausea And Vomiting   Tramadol Nausea And Vomiting and Other (See Comments)    stomach upset    ROS Review of Systems    Objective:    Physical Exam Constitutional:      Appearance: She is obese.  HENT:     Head: Normocephalic and atraumatic.  Cardiovascular:     Rate and Rhythm: Normal rate and regular rhythm.     Pulses: Normal pulses.     Heart sounds: Normal heart sounds.  Pulmonary:     Effort: Pulmonary effort is normal.     Breath sounds: Normal breath sounds.  Musculoskeletal:     Cervical back: Normal range of motion.     Right lower leg: Edema present.     Left lower leg: Edema present.  Skin:    General: Skin is warm and dry.     Capillary Refill: Capillary refill takes less than 2 seconds.  Neurological:     General: No focal deficit present.     Mental Status: She is alert and oriented to person, place, and time.    BP 139/67    Pulse 85    Temp 98.1 F (36.7 C)    Ht $R'5\' 4"'BD$  (1.626 m)    Wt 278 lb 9.6 oz (126.4 kg)    LMP 12/06/2021    SpO2 98%    BMI 47.82 kg/m  Wt Readings from Last 3 Encounters:  12/09/21 278 lb 9.6 oz (126.4 kg)  12/08/21 276 lb 6.4 oz  (125.4 kg)  11/11/21 270 lb (122.5 kg)     Health Maintenance Due  Topic Date Due   FOOT EXAM  06/02/2021    There are no preventive care reminders to display for this patient.  Lab Results  Component Value Date   TSH 2.330 07/29/2021   Lab Results  Component Value Date   WBC 13.1 (H) 09/21/2021   HGB 12.9 09/21/2021   HCT 40.3 09/21/2021   MCV 75.1 (L) 09/21/2021   PLT 274.0 09/21/2021   Lab Results  Component Value Date   NA 140 09/21/2021   K 3.4 (L) 09/21/2021   CO2 28 09/21/2021   GLUCOSE 118 (H) 09/21/2021   BUN 13 09/21/2021   CREATININE 1.10 09/21/2021   BILITOT 0.6 08/21/2021   ALKPHOS 69 08/21/2021   AST 17 08/21/2021   ALT 8 08/21/2021  PROT 7.9 08/21/2021   ALBUMIN 4.6 08/21/2021   CALCIUM 10.0 09/21/2021   ANIONGAP 14 08/21/2021   EGFR 76 07/29/2021   GFR 60.99 09/21/2021   Lab Results  Component Value Date   CHOL 125 07/29/2021   Lab Results  Component Value Date   HDL 39 (L) 07/29/2021   Lab Results  Component Value Date   LDLCALC 48 07/29/2021   Lab Results  Component Value Date   TRIG 238 (H) 07/29/2021   Lab Results  Component Value Date   CHOLHDL 3.2 07/29/2021   Lab Results  Component Value Date   HGBA1C 8.7 (A) 12/09/2021   HGBA1C 8.7 12/09/2021   HGBA1C 8.7 (A) 12/09/2021   HGBA1C 8.7 (A) 12/09/2021      Assessment & Plan:   Problem List Items Addressed This Visit       Cardiovascular and Mediastinum   Essential hypertension Stable Encouraged on going compliance with current medication regimen Encouraged home monitoring and recording BP <130/80 Eating a heart-healthy diet with less salt Encouraged regular physical activity  Recommend Weight loss     Relevant Orders   Comp. Metabolic Panel (12)     Endocrine   Type 2 diabetes mellitus without complication, with long-term current use of insulin (HCC) - Primary Borderline control We will follow back up with endocrinology   Relevant Orders   HgB A1c  (Completed)   Comp. Metabolic Panel (12)     Other   Dyslipidemia Stable  Encouraged on going compliance with current medication regimen. I recommend eating a heart-healthy diet; low in fat and cholesterol along with regular exercise. This will promote weight loss and improve cholesterol.     Relevant Orders   Lipid panel   Other Visit Diagnoses     Neurological deficit present     Worsening   Relevant Orders   Ambulatory referral to Neurology   MR Brain W Wo Contrast       No orders of the defined types were placed in this encounter.   Follow-up: No follow-ups on file.    Vevelyn Francois, NP

## 2021-12-12 ENCOUNTER — Telehealth: Payer: Self-pay | Admitting: Registered Nurse

## 2021-12-12 ENCOUNTER — Encounter (HOSPITAL_COMMUNITY): Payer: Medicaid Other

## 2021-12-12 ENCOUNTER — Telehealth: Payer: Self-pay | Admitting: Neurology

## 2021-12-12 MED ORDER — HYDROCODONE-ACETAMINOPHEN 10-325 MG PO TABS: 1.0000 | ORAL_TABLET | Freq: Three times a day (TID) | ORAL | 0 refills | Status: DC | PRN

## 2021-12-12 NOTE — Telephone Encounter (Signed)
New referral come through, saw Jill Shaw ion 2021. She has had a CT scan, but she wants an MRI done. Wasn't sure if Jill Shaw would do this or PCP. She has an appt with Jill Shaw in aug 2023

## 2021-12-12 NOTE — Telephone Encounter (Signed)
PCP has already placed an order for MRI on 2/24.  She can keep appt in August as scheduled.

## 2021-12-12 NOTE — Telephone Encounter (Signed)
PMP was Reviewed.  Hydrocodone e-scribed.  [name] changed her appointment due to a Funeral.  She is aware her prescription was sent to pharmacy via My-Chart

## 2021-12-13 ENCOUNTER — Encounter: Payer: Medicaid Other | Admitting: Registered Nurse

## 2021-12-13 ENCOUNTER — Encounter: Payer: Self-pay | Admitting: Obstetrics and Gynecology

## 2021-12-13 ENCOUNTER — Encounter: Payer: Self-pay | Admitting: Cardiology

## 2021-12-13 LAB — COMP. METABOLIC PANEL (12)
AST: 20 IU/L (ref 0–40)
Albumin/Globulin Ratio: 1.7 (ref 1.2–2.2)
Albumin: 4.4 g/dL (ref 3.8–4.8)
Alkaline Phosphatase: 149 IU/L — ABNORMAL HIGH (ref 44–121)
BUN/Creatinine Ratio: 15 (ref 9–23)
BUN: 18 mg/dL (ref 6–24)
Bilirubin Total: <0.2 mg/dL (ref 0.0–1.2)
Calcium: 9.8 mg/dL (ref 8.7–10.2)
Chloride: 100 mmol/L (ref 96–106)
Creatinine, Ser: 1.24 mg/dL — ABNORMAL HIGH (ref 0.57–1.00)
Globulin, Total: 2.6 g/dL (ref 1.5–4.5)
Glucose: 243 mg/dL — ABNORMAL HIGH (ref 70–99)
Potassium: 4.3 mmol/L (ref 3.5–5.2)
Sodium: 143 mmol/L (ref 134–144)
Total Protein: 7 g/dL (ref 6.0–8.5)
eGFR: 55 mL/min/{1.73_m2} — ABNORMAL LOW (ref >59)

## 2021-12-13 LAB — LIPID PANEL
Chol/HDL Ratio: 2.8 ratio (ref 0.0–4.4)
Cholesterol, Total: 153 mg/dL (ref 100–199)
HDL: 54 mg/dL (ref >39)
LDL Chol Calc (NIH): 74 mg/dL (ref 0–99)
Triglycerides: 144 mg/dL (ref 0–149)
VLDL Cholesterol Cal: 25 mg/dL (ref 5–40)

## 2021-12-14 ENCOUNTER — Encounter: Payer: Self-pay | Admitting: Physical Medicine and Rehabilitation

## 2021-12-14 ENCOUNTER — Other Ambulatory Visit (HOSPITAL_COMMUNITY): Payer: Self-pay

## 2021-12-14 ENCOUNTER — Telehealth: Payer: Self-pay

## 2021-12-14 ENCOUNTER — Other Ambulatory Visit: Payer: Self-pay | Admitting: Student

## 2021-12-14 NOTE — Telephone Encounter (Signed)
Patient Advocate Encounter ?  ?Received notification from [facility] Tracks that prior authorization for Lantus Solostar pen injector is required by his/her insurance [place] [employer]. ?  ?PA submitted on 12/14/21 ? ?[facility] Tracks #: [id number] ? ?Status is pending ?   ?[facility] will continue to follow: ? ?Patient Advocate ?Fax: [phone]  ?

## 2021-12-15 ENCOUNTER — Telehealth: Payer: Self-pay

## 2021-12-15 ENCOUNTER — Other Ambulatory Visit: Payer: Self-pay | Admitting: Nurse Practitioner

## 2021-12-15 DIAGNOSIS — Z008 Encounter for other general examination: Secondary | ICD-10-CM

## 2021-12-15 NOTE — Telephone Encounter (Signed)
PA for Hydrocodone-APAP sent through [facility] ?

## 2021-12-15 NOTE — Telephone Encounter (Signed)
VIN# [id number] and PCN# [id number] (for Medicaid) ?

## 2021-12-15 NOTE — Progress Notes (Unsigned)
   [facility] [address] [place], [place]  [address] Phone:  [phone]   Fax:  [phone] 

## 2021-12-16 ENCOUNTER — Other Ambulatory Visit (HOSPITAL_COMMUNITY): Payer: Self-pay

## 2021-12-16 NOTE — Telephone Encounter (Signed)
PA for Hydrocodone- APAP approved through [place] Tracks through 04/14/22 ?

## 2021-12-16 NOTE — Telephone Encounter (Signed)
Patient Advocate Encounter ? ?Prior Authorization for Lantus Solostar pen injector has been approved.   ? ?[facility] Tracks #: [id number] ? ?Effective dates: 12/14/21 through 12/14/22 ? ?Per Test Claim Patients co-pay is $4.  ? ?Spoke with Pharmacy to Process. ? ?Patient Advocate ?Fax: [phone]  ?

## 2021-12-19 ENCOUNTER — Ambulatory Visit: Payer: Medicaid Other | Admitting: Registered Nurse

## 2021-12-19 ENCOUNTER — Telehealth: Payer: Self-pay | Admitting: Family Medicine

## 2021-12-19 NOTE — Telephone Encounter (Signed)
Called number listed under mobile for patient to schedule a gyn visit, there was no answer to the phone call so a voicemail was left with the call back number for the office.  ?

## 2021-12-20 ENCOUNTER — Ambulatory Visit
Admission: RE | Admit: 2021-12-20 | Discharge: 2021-12-20 | Disposition: A | Discharge status: Home / Self Care | Payer: Medicaid Other | Source: Ambulatory Visit | Attending: Obstetrics and Gynecology | Admitting: Obstetrics and Gynecology

## 2021-12-20 DIAGNOSIS — Z1231 Encounter for screening mammogram for malignant neoplasm of breast: Secondary | ICD-10-CM

## 2021-12-20 NOTE — Progress Notes (Signed)
Pt's past medical history reviewed with Dr Mal Amabile. Pt will need to be seen and cleared by Neuro, along with cardiac clearance prior to surgery, either here or the main OR. LVM and message sent via IM to Consuello Bossier, surgery scheduler with the above information. ?

## 2021-12-21 ENCOUNTER — Encounter: Payer: Self-pay | Admitting: Pulmonary Disease

## 2021-12-21 ENCOUNTER — Ambulatory Visit (INDEPENDENT_AMBULATORY_CARE_PROVIDER_SITE_OTHER): Payer: Medicaid Other | Admitting: Pulmonary Disease

## 2021-12-21 ENCOUNTER — Other Ambulatory Visit: Payer: Self-pay

## 2021-12-21 VITALS — BP 148/76 | HR 115 | Temp 98.3°F | Ht 64.0 in | Wt 281.6 lb

## 2021-12-21 DIAGNOSIS — R0683 Snoring: Secondary | ICD-10-CM | POA: Diagnosis not present

## 2021-12-21 DIAGNOSIS — F17210 Nicotine dependence, cigarettes, uncomplicated: Secondary | ICD-10-CM

## 2021-12-21 DIAGNOSIS — J449 Chronic obstructive pulmonary disease, unspecified: Secondary | ICD-10-CM

## 2021-12-21 DIAGNOSIS — R0602 Shortness of breath: Secondary | ICD-10-CM

## 2021-12-21 DIAGNOSIS — R0609 Other forms of dyspnea: Secondary | ICD-10-CM | POA: Diagnosis not present

## 2021-12-21 DIAGNOSIS — J42 Unspecified chronic bronchitis: Secondary | ICD-10-CM

## 2021-12-21 NOTE — Patient Instructions (Addendum)
Thank you for visiting Dr. Tonia Brooms at [facility]. ?Today we recommend the following: ? ?New Consult in Sleep Clinic ?Smoking quit date July 4th! ? ?Return in about 6 months (around 06/23/2022) for with APP or Dr. Tonia Brooms. ? ? ? ?Please do your part to reduce the spread of COVID-19.  ? ?You must quit smoking or vaping. This is the single most important thing that you can do to improve your lung health.  ? ?S = Set a quit date. ?T = Tell family, friends, and the people around you that you plan to quit. ?A = Anticipate or plan ahead for the tough times you'll face while quitting. ?R = Remove cigarettes and other tobacco products from your home, car, and work ?T = Talk to [place] about getting help to quit ? ?If you need help feel free to reach out to our office, [facility] Smoking Cessation Class: [phone], call 1-800-QUIT-NOW, or visit [email or web address].be. ? ? ? ? ?

## 2021-12-21 NOTE — Progress Notes (Signed)
Smoking Cessation Counseling:  ? ?The patient?s current tobacco use: 6-8 cigarettes per day  ?The patient was advised to quit and impact of smoking on their health.  ?I assessed the patient's willingness to attempt to quit. ?I provided methods and skills for cessation. ?We reviewed medication management of smoking session drugs if appropriate. ?Resources to help quit smoking were provided. ?A smoking cessation quit date was set: July 4th  ?Follow-up was arranged in our clinic.  ?The amount of time spent counseling patient was 4 mins  ? ?Josephine Igo, DO ?[facility] ?12/21/2021 10:00 AM   ? ?

## 2021-12-21 NOTE — Progress Notes (Signed)
Synopsis: Referred in August 2020 for COPD by Vevelyn Francois, NP  Subjective:   PATIENT ID: Jill Shaw GENDER: female DOB: 08/08/1976, MRN: [id number]  Chief Complaint  Patient presents with   Follow-up    Follow up. Patient says everything is going okay and she's had asthma attacks recently.     46 year old female with a past medical history of asthma, COPD, no PFTs on file, gastroesophageal reflux disease, hypertension patient was seen in the emergency department in May 2020 for accidental drug overdose.  Multiple prior lab draws with positive alcohol intoxication. She is here today for evaluation of SOB. She states that it really all started bout 10-15 years ago. She was on lisinopril in the past and it was stopped because she was told it could cause her cough or SOB. She has had cough for a long time. She has been given several different medications and nothing has worked. Current smoker for the past 8-9 years, currently down to 6-7 cigarettes per day, down from 10 per day. 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 She did have dust exposure in the warehouse. She has DMII, GERD.  She has very little exercise if any at all during the week.  OV 09/27/2020: Patient here today for follow-up.  Overall doing well from a respiratory standpoint.  She quit smoking 2 weeks ago.  She is very happy and proud of this.  She is trying to become more active.  She is trying to exercise more but has been limited by knee pain.  She does have an MRI of the knee scheduled soon.  Currently stable with her inhaler regimen.  She does need refills of her medications today.  OV 12/21/2021: Patient here today for follow-up regarding her smoking.  Asthma, chronic bronchitis symptoms, chronic diastolic heart failure and obesity.  She unfortunately has restarted smoking.  She had a recent exacerbation.  She still uses her inhalers regularly.   Past Medical History:  Diagnosis Date   Anemia     Arthritis    knees, hands   Asthma    Chronic diastolic (congestive) heart failure (HCC)    COPD (chronic obstructive pulmonary disease) (HCC)    Diabetes mellitus without complication (HCC)    type 2   DKA (diabetic ketoacidosis) ([place]) 10/15/2020   Dysfunctional uterine bleeding    Gastritis    GERD (gastroesophageal reflux disease)    Hypertension    Neuromuscular disorder (HCC)    neuropathy feet   Seizures ([place]) 09/12/2017   pt states r/t stress and blood sugar - no meds last one 4 months ago, not seen neurologist   Sickle cell trait ([place])    Smoker    Vitamin D deficiency 10/2019   Wears glasses      Family History  Problem Relation Age of Onset   Diabetes Mother    Hypertension Mother    Migraines Paternal Grandfather    Colon cancer Neg Hx    Esophageal cancer Neg Hx    Stomach cancer Neg Hx    Rectal cancer Neg Hx      Past Surgical History:  Procedure Laterality Date   CESAREAN SECTION     x 1. for twins   DILATION AND CURETTAGE OF UTERUS N/A 08/20/2019   Procedure: DILATATION AND CURETTAGE;  Surgeon: Emily Filbert, MD;  Location: [facility];  Service: Gynecology;  Laterality: N/A;   ENDOMETRIAL ABLATION N/A 08/20/2019   Procedure: [place] Ablation;  Surgeon: Hulan Fray,  Wilhemina Cash, MD;  Location: [facility];  Service: Gynecology;  Laterality: N/A;   EYE SURGERY Bilateral    laser right and cataract removed left eye   LEFT HEART CATH AND CORONARY ANGIOGRAPHY N/A 08/30/2021   Procedure: LEFT HEART CATH AND CORONARY ANGIOGRAPHY;  Surgeon: Nigel Mormon, MD;  Location: [facility];  Service: Cardiovascular;  Laterality: N/A;   RADIOLOGY WITH ANESTHESIA N/A 09/16/2019   Procedure: MRI WITH ANESTHESIA   L SPINE WITHOUT CONTRAST, T SPINE WITHOUT CONTRAST , CERVICAL WITHOUT CONTRAST;  Surgeon: Radiologist, Medication, MD;  Location: [facility];  Service: Radiology;  Laterality: N/A;   TUBAL LIGATION     interval BTL   UPPER GI ENDOSCOPY  07/2017    Social History    Socioeconomic History   Marital status: Legally Separated    Spouse name: Not on file   Number of children: 3   Years of education: Not on file   Highest education level: Not on file  Occupational History   Occupation: unemployed  Tobacco Use   Smoking status: Some Days    Packs/day: 0.25    Years: 26.00    Pack years: 6.50    Types: Cigarettes    Last attempt to quit: 09/13/2020    Years since quitting: 1.2   Smokeless tobacco: Never   Tobacco comments:    4-5 cigarettes/day  Vaping Use   Vaping Use: Never used  Substance and Sexual Activity   Alcohol use: No   Drug use: No   Sexual activity: Not Currently    Birth control/protection: None  Other Topics Concern   Not on file  Social History Narrative   Right Handed   Lives in a one story apartment, but lives on the second floor   Drinks caffeine once in awhile   Social Determinants of Health   Financial Resource Strain: Not on file  Food Insecurity: Food Insecurity Present   Worried About [place] in the Last Year: Never true   [place] in the Last Year: Sometimes true  Transportation Needs: No Transportation Needs   Lack of Transportation (Medical): No   Lack of Transportation (Non-Medical): No  Physical Activity: Not on file  Stress: Not on file  Social Connections: Not on file  Intimate Partner Violence: Not on file     Allergies  Allergen Reactions   Amitriptyline Other (See Comments)    Coma   Ketoprofen Nausea And Vomiting   Trazodone Nausea And Vomiting   Aspirin Nausea Only   Gabapentin Nausea And Vomiting and Other (See Comments)    upset stomach   Ibuprofen Nausea And Vomiting   Liraglutide Nausea And Vomiting   Naproxen Nausea And Vomiting   Omeprazole-Sodium Bicarbonate Nausea And Vomiting   Sulfa Antibiotics Nausea And Vomiting   Tramadol Nausea And Vomiting and Other (See Comments)    stomach upset     Outpatient Medications Prior to Visit  Medication Sig Dispense  Refill   Accu-Chek Softclix Lancets lancets USE AS DIRECTED UP TO 4 TIMES DAILY. 100 each 3   albuterol (PROVENTIL) (2.5 MG/3ML) 0.083% nebulizer solution Take 3 mLs (2.5 mg total) by nebulization every 6 (six) hours as needed for wheezing or shortness of breath. 150 mL 6   albuterol (VENTOLIN HFA) 108 (90 Base) MCG/ACT inhaler Inhale 2 puffs into the lungs every 6 (six) hours as needed for wheezing or shortness of breath. 8 g 6   blood glucose meter kit and supplies  KIT 1 each by Other route See admin instructions. Dispense based on patient and insurance preference. Use up to four times daily as directed. (FOR ICD-9 250.00, 250.01). 1 each 2   Blood Pressure Monitoring (ADULT BLOOD PRESSURE CUFF LG) KIT 1 kit by Does not apply route daily. 1 kit 0   budesonide-formoterol (SYMBICORT) 160-4.5 MCG/ACT inhaler INHALE 2 PUFFS INTO THE LUNGS 2 (TWO) TIMES DAILY. 10.2 g 6   cetirizine (ZYRTEC) 10 MG tablet Take 1 tablet (10 mg total) by mouth daily. 30 tablet 3   Continuous Blood Gluc Sensor (DEXCOM G6 SENSOR) MISC 1 Device by Does not apply route as directed. 9 each 3   Continuous Blood Gluc Transmit (DEXCOM G6 TRANSMITTER) MISC 1 Device by Does not apply route as directed. 1 each 3   Dexlansoprazole (DEXILANT) 30 MG capsule DR Take 1 capsule (30 mg total) by mouth daily. 30 capsule 11   empagliflozin (JARDIANCE) 25 MG TABS tablet Take 1 tablet (25 mg total) by mouth daily before breakfast. 90 tablet 3   fluticasone (FLONASE) 50 MCG/ACT nasal spray Place 2 sprays into both nostrils daily. 16 g 6   furosemide (LASIX) 40 MG tablet TAKE 1 TABLET(40MG TOTAL) BY MOUTH TWICE DAILY 180 tablet 1   Glucosamine Sulfate 1000 MG CAPS Take 1 capsule (1,000 mg total) by mouth 2 (two) times daily. (Patient taking differently: Take 1,000 mg by mouth 2 (two) times daily.) 180 capsule 3   glucose blood (ACCU-CHEK GUIDE) test strip USE AS DIRECTED UP TO FOUR TIMES DAILY 100 strip 4   haloperidol (HALDOL) 5 MG tablet Take  0.5-1 tablets (2.5-5 mg total) by mouth every 8 (eight) hours as needed (For nausea/vomiting). 15 tablet 0   Heating Pads (HEATING PAD MOIST/DRY KING SZ) PADS 1 each by Does not apply route 4 (four) times daily as needed. 120 each 1   hydrochlorothiazide (MICROZIDE) 12.5 MG capsule TAKE 1 CAPSULE(12.5 MG) BY MOUTH DAILY 90 capsule 3   HYDROcodone-acetaminophen (NORCO) 10-325 MG tablet Take 1 tablet by mouth 3 (three) times daily as needed. Do Not Fill Before 12/14/2021 90 tablet 0   hydrocortisone 2.5 % cream Apply topically 2 (two) times daily. (Patient taking differently: Apply 1 application. topically 2 (two) times daily.) 30 g 11   hydroquinone 4 % cream APPLY TOPICALLY TWICE DAILY (Patient taking differently: Apply 1 application. topically 2 (two) times daily as needed (pain).) 28.35 g 0   hydrOXYzine (ATARAX) 25 MG tablet Take 1 tablet (25 mg total) by mouth every 8 (eight) hours as needed. Anxiety 60 tablet 0   hydrOXYzine (VISTARIL) 50 MG capsule Take 1 capsule (50 mg total) by mouth 3 (three) times daily. 90 capsule 5   insulin glargine (LANTUS SOLOSTAR) 100 UNIT/ML Solostar Pen Inject 110 Units into the skin daily. 105 mL 3   insulin lispro (HUMALOG KWIKPEN) 200 UNIT/ML KwikPen Max daily 180 units 90 mL 3   Insulin Pen Needle 31G X 8 MM MISC 1 Device by Does not apply route in the morning, at noon, in the evening, and at bedtime. 400 each 3   losartan (COZAAR) 100 MG tablet TAKE 1 TABLET BY MOUTH EVERY DAY 30 tablet 3   metoCLOPramide (REGLAN) 5 MG tablet Take 1 tablet (5 mg total) by mouth every 12 (twelve) hours. 60 tablet 3   metoprolol succinate (TOPROL-XL) 100 MG 24 hr tablet Take 100 mg by mouth daily. Take with or immediately following a meal.     ondansetron (ZOFRAN) 4 MG  tablet Take 1 tablet (4 mg total) by mouth every 8 (eight) hours as needed for nausea or vomiting. 30 tablet 1   potassium chloride SA (KLOR-CON) 20 MEQ tablet TAKE 1 TABLET(20 MEQ) BY MOUTH DAILY (Patient taking  differently: Take 20 mEq by mouth daily.) 30 tablet 6   predniSONE (STERAPRED UNI-PAK 21 TAB) 5 MG (21) TBPK tablet Take as directed 21 tablet 0   rosuvastatin (CRESTOR) 20 MG tablet Take 1 tablet (20 mg total) by mouth daily. 30 tablet 3   spironolactone (ALDACTONE) 50 MG tablet TAKE 1 TABLET(50 MG) BY MOUTH DAILY (Patient taking differently: Take 50 mg by mouth daily.) 30 tablet 3   sucralfate (CARAFATE) 1 g tablet Take 1 tablet (1 g total) by mouth 4 (four) times daily -  with meals and at bedtime. 90 tablet 0   sucralfate (CARAFATE) 1 GM/10ML suspension Take 10 mLs (1 g total) by mouth 4 (four) times daily. 420 mL 1   Tiotropium Bromide Monohydrate (SPIRIVA RESPIMAT) 2.5 MCG/ACT AERS Inhale 2 puffs into the lungs daily. 4 g 6   tiZANidine (ZANAFLEX) 4 MG tablet Take 1.5 tablets (6 mg total) by mouth every 6 (six) hours as needed for muscle spasms. 120 tablet 3   TOBRADEX ophthalmic solution Place 1 drop into the right eye 4 (four) times daily.     trifluoperazine (STELAZINE) 1 MG tablet Take by mouth.     isosorbide mononitrate (IMDUR) 30 MG 24 hr tablet Take 1 tablet (30 mg total) by mouth daily. 90 tablet 3   norethindrone (AYGESTIN) 5 MG tablet Take 2 tablets (10 mg total) by mouth 2 (two) times daily as needed. 120 tablet 2   saxagliptin HCl (ONGLYZA) 5 MG TABS tablet Take 1 tablet (5 mg total) by mouth daily. 90 tablet 3   No facility-administered medications prior to visit.    Review of Systems  Constitutional:  Negative for chills, fever, malaise/fatigue and weight loss.  HENT:  Negative for hearing loss, sore throat and tinnitus.   Eyes:  Negative for blurred vision and double vision.  Respiratory:  Positive for cough and shortness of breath. Negative for hemoptysis, sputum production, wheezing and stridor.   Cardiovascular:  Positive for orthopnea and leg swelling. Negative for chest pain, palpitations and PND.  Gastrointestinal:  Negative for abdominal pain, constipation,  diarrhea, heartburn, nausea and vomiting.  Genitourinary:  Negative for dysuria, hematuria and urgency.  Musculoskeletal:  Negative for joint pain and myalgias.  Skin:  Negative for itching and rash.  Neurological:  Negative for dizziness, tingling, weakness and headaches.  Endo/Heme/Allergies:  Negative for environmental allergies. Does not bruise/bleed easily.  Psychiatric/Behavioral:  Negative for depression. The patient is not nervous/anxious and does not have insomnia.   All other systems reviewed and are negative.   Objective:  Physical Exam Vitals reviewed.  Constitutional:      General: She is not in acute distress.    Appearance: She is well-developed. She is obese.  HENT:     Head: Normocephalic and atraumatic.  Eyes:     General: No scleral icterus.    Conjunctiva/sclera: Conjunctivae normal.     Pupils: Pupils are equal, round, and reactive to light.  Neck:     Vascular: No JVD.     Trachea: No tracheal deviation.  Cardiovascular:     Rate and Rhythm: Normal rate and regular rhythm.     Heart sounds: Normal heart sounds. No murmur heard. Pulmonary:     Effort: Pulmonary effort is  normal. No tachypnea, accessory muscle usage or respiratory distress.     Breath sounds: No stridor. No wheezing, rhonchi or rales.  Abdominal:     General: There is no distension.     Palpations: Abdomen is soft.     Tenderness: There is no abdominal tenderness.     Comments: Obese pannus  Musculoskeletal:        General: No tenderness.     Cervical back: Neck supple.  Lymphadenopathy:     Cervical: No cervical adenopathy.  Skin:    General: Skin is warm and dry.     Capillary Refill: Capillary refill takes less than 2 seconds.     Findings: No rash.  Neurological:     Mental Status: She is alert and oriented to person, place, and time.  Psychiatric:        Behavior: Behavior normal.     Vitals:   12/21/21 0948  BP: (!) 148/76  Pulse: (!) 115  Temp: 98.3 F (36.8 C)   TempSrc: Oral  SpO2: 99%  Weight: 281 lb 9.6 oz (127.7 kg)  Height: 5' 4" (1.626 m)   99% on RA BMI Readings from Last 3 Encounters:  12/21/21 48.34 kg/m  12/09/21 47.82 kg/m  12/08/21 47.44 kg/m   Wt Readings from Last 3 Encounters:  12/21/21 281 lb 9.6 oz (127.7 kg)  12/09/21 278 lb 9.6 oz (126.4 kg)  12/08/21 276 lb 6.4 oz (125.4 kg)     CBC    Component Value Date/Time   WBC 13.1 (H) 09/21/2021 1533   RBC 5.36 (H) 09/21/2021 1533   HGB 12.9 09/21/2021 1533   HGB 11.9 07/29/2021 1629   HCT 40.3 09/21/2021 1533   HCT 38.0 07/29/2021 1629   PLT 274.0 09/21/2021 1533   PLT 271 07/29/2021 1629   MCV 75.1 (L) 09/21/2021 1533   MCV 74 (L) 07/29/2021 1629   MCH 23.2 (L) 08/21/2021 2209   MCHC 32.0 09/21/2021 1533   RDW 23.0 (H) 09/21/2021 1533   RDW 21.8 (H) 07/29/2021 1629   LYMPHSABS 3.0 09/21/2021 1533   LYMPHSABS 2.3 07/29/2021 1629   MONOABS 0.6 09/21/2021 1533   EOSABS 0.4 09/21/2021 1533   EOSABS 0.4 07/29/2021 1629   BASOSABS 0.1 09/21/2021 1533   BASOSABS 0.0 07/29/2021 1629    Chest Imaging: 02/07/2019 chest x-ray: No acute cardiopulmonary process. The patient's images have been independently reviewed by me.    Pulmonary Functions Testing Results: PFT Results Latest Ref Rng & Units 10/07/2019  FVC-Pre L 2.31  FVC-Predicted Pre % 72  FVC-Post L 2.41  FVC-Predicted Post % 76  Pre FEV1/FVC % % 77  Post FEV1/FCV % % 66  FEV1-Pre L 1.78  FEV1-Predicted Pre % 68  FEV1-Post L 1.58  DLCO uncorrected ml/min/mmHg 19.24  DLCO UNC% % 85  DLVA Predicted % 121  TLC L 4.35  TLC % Predicted % 83  RV % Predicted % 109    FeNO: None  Pathology: None  Echocardiogram: None  Heart Catheterization: None    Assessment & Plan:     ICD-10-CM   1. DOE (dyspnea on exertion)  R06.09     2. SOB (shortness of breath)  R06.02     3. Moderate COPD (chronic obstructive pulmonary disease) (HCC)  J44.9     4. Snoring  R06.83     5. Chronic bronchitis,  unspecified chronic bronchitis type ([place])  J42     6. Morbid obesity ([place])  E66.01  Discussion:  This is a 46 year old female, multifactorial etiology of her dyspnea.  She probably has COPD, chronic diastolic heart failure obesity, BMI 48.  She has gained weight since she was seen by me last in 2021.  She has not had a sleep study and not followed up with recommendations regarding establishing care in sleep clinic.  She unfortunately has continued to go back to smoking.  We spent a long time today in the office to talking about her needing to make better decisions on taking care of herself.  She needs to quit smoking and she needs to exercise and lose weight because her breathing is not going to get any better.  Plan: Patient counseled heavily on smoking cessation please see separate documentation Patient counseled heavily on continued weight loss and exercise. Patient counseled again on needing to establish care and sleep clinic for evaluation of likely diagnosis of sleep apnea. She also has some insomnia issues that they need to address. Hopefully she will continue to make decisions that will help improve her health. Follow-up with [place] as needed in 6 months Follow-up closely with primary care. Continue current inhaler regimen.   Current Outpatient Medications:    Accu-Chek Softclix Lancets lancets, USE AS DIRECTED UP TO 4 TIMES DAILY., Disp: 100 each, Rfl: 3   albuterol (PROVENTIL) (2.5 MG/3ML) 0.083% nebulizer solution, Take 3 mLs (2.5 mg total) by nebulization every 6 (six) hours as needed for wheezing or shortness of breath., Disp: 150 mL, Rfl: 6   albuterol (VENTOLIN HFA) 108 (90 Base) MCG/ACT inhaler, Inhale 2 puffs into the lungs every 6 (six) hours as needed for wheezing or shortness of breath., Disp: 8 g, Rfl: 6   blood glucose meter kit and supplies KIT, 1 each by Other route See admin instructions. Dispense based on patient and insurance preference. Use up to four times  daily as directed. (FOR ICD-9 250.00, 250.01)., Disp: 1 each, Rfl: 2   Blood Pressure Monitoring (ADULT BLOOD PRESSURE CUFF LG) KIT, 1 kit by Does not apply route daily., Disp: 1 kit, Rfl: 0   budesonide-formoterol (SYMBICORT) 160-4.5 MCG/ACT inhaler, INHALE 2 PUFFS INTO THE LUNGS 2 (TWO) TIMES DAILY., Disp: 10.2 g, Rfl: 6   cetirizine (ZYRTEC) 10 MG tablet, Take 1 tablet (10 mg total) by mouth daily., Disp: 30 tablet, Rfl: 3   Continuous Blood Gluc Sensor (DEXCOM G6 SENSOR) MISC, 1 Device by Does not apply route as directed., Disp: 9 each, Rfl: 3   Continuous Blood Gluc Transmit (DEXCOM G6 TRANSMITTER) MISC, 1 Device by Does not apply route as directed., Disp: 1 each, Rfl: 3   Dexlansoprazole (DEXILANT) 30 MG capsule DR, Take 1 capsule (30 mg total) by mouth daily., Disp: 30 capsule, Rfl: 11   empagliflozin (JARDIANCE) 25 MG TABS tablet, Take 1 tablet (25 mg total) by mouth daily before breakfast., Disp: 90 tablet, Rfl: 3   fluticasone (FLONASE) 50 MCG/ACT nasal spray, Place 2 sprays into both nostrils daily., Disp: 16 g, Rfl: 6   furosemide (LASIX) 40 MG tablet, TAKE 1 TABLET(40MG TOTAL) BY MOUTH TWICE DAILY, Disp: 180 tablet, Rfl: 1   Glucosamine Sulfate 1000 MG CAPS, Take 1 capsule (1,000 mg total) by mouth 2 (two) times daily. (Patient taking differently: Take 1,000 mg by mouth 2 (two) times daily.), Disp: 180 capsule, Rfl: 3   glucose blood (ACCU-CHEK GUIDE) test strip, USE AS DIRECTED UP TO FOUR TIMES DAILY, Disp: 100 strip, Rfl: 4   haloperidol (HALDOL) 5 MG tablet, Take 0.5-1 tablets (2.5-5  mg total) by mouth every 8 (eight) hours as needed (For nausea/vomiting)., Disp: 15 tablet, Rfl: 0   Heating Pads (HEATING PAD MOIST/DRY KING SZ) PADS, 1 each by Does not apply route 4 (four) times daily as needed., Disp: 120 each, Rfl: 1   hydrochlorothiazide (MICROZIDE) 12.5 MG capsule, TAKE 1 CAPSULE(12.5 MG) BY MOUTH DAILY, Disp: 90 capsule, Rfl: 3   HYDROcodone-acetaminophen (NORCO) 10-325 MG tablet,  Take 1 tablet by mouth 3 (three) times daily as needed. Do Not Fill Before 12/14/2021, Disp: 90 tablet, Rfl: 0   hydrocortisone 2.5 % cream, Apply topically 2 (two) times daily. (Patient taking differently: Apply 1 application. topically 2 (two) times daily.), Disp: 30 g, Rfl: 11   hydroquinone 4 % cream, APPLY TOPICALLY TWICE DAILY (Patient taking differently: Apply 1 application. topically 2 (two) times daily as needed (pain).), Disp: 28.35 g, Rfl: 0   hydrOXYzine (ATARAX) 25 MG tablet, Take 1 tablet (25 mg total) by mouth every 8 (eight) hours as needed. Anxiety, Disp: 60 tablet, Rfl: 0   hydrOXYzine (VISTARIL) 50 MG capsule, Take 1 capsule (50 mg total) by mouth 3 (three) times daily., Disp: 90 capsule, Rfl: 5   insulin glargine (LANTUS SOLOSTAR) 100 UNIT/ML Solostar Pen, Inject 110 Units into the skin daily., Disp: 105 mL, Rfl: 3   insulin lispro (HUMALOG KWIKPEN) 200 UNIT/ML KwikPen, Max daily 180 units, Disp: 90 mL, Rfl: 3   Insulin Pen Needle 31G X 8 MM MISC, 1 Device by Does not apply route in the morning, at noon, in the evening, and at bedtime., Disp: 400 each, Rfl: 3   losartan (COZAAR) 100 MG tablet, TAKE 1 TABLET BY MOUTH EVERY DAY, Disp: 30 tablet, Rfl: 3   metoCLOPramide (REGLAN) 5 MG tablet, Take 1 tablet (5 mg total) by mouth every 12 (twelve) hours., Disp: 60 tablet, Rfl: 3   metoprolol succinate (TOPROL-XL) 100 MG 24 hr tablet, Take 100 mg by mouth daily. Take with or immediately following a meal., Disp: , Rfl:    ondansetron (ZOFRAN) 4 MG tablet, Take 1 tablet (4 mg total) by mouth every 8 (eight) hours as needed for nausea or vomiting., Disp: 30 tablet, Rfl: 1   potassium chloride SA (KLOR-CON) 20 MEQ tablet, TAKE 1 TABLET(20 MEQ) BY MOUTH DAILY (Patient taking differently: Take 20 mEq by mouth daily.), Disp: 30 tablet, Rfl: 6   predniSONE (STERAPRED UNI-PAK 21 TAB) 5 MG (21) TBPK tablet, Take as directed, Disp: 21 tablet, Rfl: 0   rosuvastatin (CRESTOR) 20 MG tablet, Take 1  tablet (20 mg total) by mouth daily., Disp: 30 tablet, Rfl: 3   spironolactone (ALDACTONE) 50 MG tablet, TAKE 1 TABLET(50 MG) BY MOUTH DAILY (Patient taking differently: Take 50 mg by mouth daily.), Disp: 30 tablet, Rfl: 3   sucralfate (CARAFATE) 1 g tablet, Take 1 tablet (1 g total) by mouth 4 (four) times daily -  with meals and at bedtime., Disp: 90 tablet, Rfl: 0   sucralfate (CARAFATE) 1 GM/10ML suspension, Take 10 mLs (1 g total) by mouth 4 (four) times daily., Disp: 420 mL, Rfl: 1   Tiotropium Bromide Monohydrate (SPIRIVA RESPIMAT) 2.5 MCG/ACT AERS, Inhale 2 puffs into the lungs daily., Disp: 4 g, Rfl: 6   tiZANidine (ZANAFLEX) 4 MG tablet, Take 1.5 tablets (6 mg total) by mouth every 6 (six) hours as needed for muscle spasms., Disp: 120 tablet, Rfl: 3   TOBRADEX ophthalmic solution, Place 1 drop into the right eye 4 (four) times daily., Disp: , Rfl:  trifluoperazine (STELAZINE) 1 MG tablet, Take by mouth., Disp: , Rfl:    isosorbide mononitrate (IMDUR) 30 MG 24 hr tablet, Take 1 tablet (30 mg total) by mouth daily., Disp: 90 tablet, Rfl: 3   norethindrone (AYGESTIN) 5 MG tablet, Take 2 tablets (10 mg total) by mouth 2 (two) times daily as needed., Disp: 120 tablet, Rfl: 2   saxagliptin HCl (ONGLYZA) 5 MG TABS tablet, Take 1 tablet (5 mg total) by mouth daily., Disp: 90 tablet, Rfl: 3   Garner Nash, DO [facility] 12/21/2021 9:55 AM

## 2021-12-22 ENCOUNTER — Encounter (HOSPITAL_COMMUNITY)
Admission: RE | Admit: 2021-12-22 | Discharge: 2021-12-22 | Disposition: A | Discharge status: Home / Self Care | Payer: Medicaid Other | Source: Ambulatory Visit | Attending: Internal Medicine | Admitting: Internal Medicine

## 2021-12-22 DIAGNOSIS — R109 Unspecified abdominal pain: Secondary | ICD-10-CM | POA: Diagnosis not present

## 2021-12-22 DIAGNOSIS — K835 Biliary cyst: Secondary | ICD-10-CM | POA: Diagnosis not present

## 2021-12-22 MED ORDER — TECHNETIUM TC 99M MEBROFENIN IV KIT
5.2000 | PACK | Freq: Once | INTRAVENOUS | Status: AC | PRN
  Administered 2021-12-22: 13:00:00 5.2 via INTRAVENOUS

## 2021-12-26 ENCOUNTER — Other Ambulatory Visit (HOSPITAL_COMMUNITY): Payer: Self-pay

## 2021-12-26 ENCOUNTER — Telehealth: Payer: Self-pay

## 2021-12-26 NOTE — Telephone Encounter (Signed)
Patient Advocate Encounter ?  ?Received notification from [facility] that prior authorization for Humalog Kwikpen 200 unit/ml pen injectors is required by his/her insurance [place] [employer]. ?  ?PA submitted on 12/26/21 ? ?[facility] [facility] #: [id number] ? ?Status is pending ?   ?[facility] will continue to follow: ? ?Patient Advocate ?Fax: [phone]  ?

## 2021-12-28 ENCOUNTER — Other Ambulatory Visit (HOSPITAL_COMMUNITY): Payer: Self-pay

## 2021-12-28 ENCOUNTER — Ambulatory Visit (HOSPITAL_BASED_OUTPATIENT_CLINIC_OR_DEPARTMENT_OTHER): Admit: 2021-12-28 | Payer: Medicaid Other | Admitting: Orthopedic Surgery

## 2021-12-28 ENCOUNTER — Other Ambulatory Visit: Payer: Self-pay | Admitting: Internal Medicine

## 2021-12-28 ENCOUNTER — Telehealth: Payer: Self-pay | Admitting: Pulmonary Disease

## 2021-12-28 ENCOUNTER — Encounter (HOSPITAL_BASED_OUTPATIENT_CLINIC_OR_DEPARTMENT_OTHER): Payer: Self-pay

## 2021-12-28 SURGERY — RELEASE, A1 PULLEY, FOR TRIGGER FINGER
Anesthesia: Monitor Anesthesia Care | Laterality: Right

## 2021-12-28 MED ORDER — NOVOLOG FLEXPEN 100 UNIT/ML ~~LOC~~ SOPN: PEN_INJECTOR | SUBCUTANEOUS | 11 refills | Status: DC

## 2021-12-28 NOTE — Telephone Encounter (Signed)
Insurance will covers NOVOLOG flexpen. If patient would like to remain with the pens, please send in script for NOVOLOG.

## 2021-12-28 NOTE — Telephone Encounter (Signed)
Patient Advocate Encounter ? ?Received notification from [facility] that the request for prior authorization for Humalog Kwikpen 200 unit/ml has been denied due to the patient not trying Humalog or Novolog vials, which is preferred. ? ?Specialty Pharmacy Patient Advocate ?Fax: [phone]  ?

## 2021-12-29 ENCOUNTER — Ambulatory Visit: Payer: Self-pay | Admitting: Nurse Practitioner

## 2021-12-29 ENCOUNTER — Other Ambulatory Visit: Payer: Self-pay

## 2021-12-29 ENCOUNTER — Ambulatory Visit (INDEPENDENT_AMBULATORY_CARE_PROVIDER_SITE_OTHER): Payer: Medicaid Other | Admitting: Nurse Practitioner

## 2021-12-29 ENCOUNTER — Other Ambulatory Visit: Payer: Self-pay | Admitting: Nurse Practitioner

## 2021-12-29 DIAGNOSIS — R82998 Other abnormal findings in urine: Secondary | ICD-10-CM

## 2021-12-29 DIAGNOSIS — R8290 Unspecified abnormal findings in urine: Secondary | ICD-10-CM

## 2021-12-29 DIAGNOSIS — M5440 Lumbago with sciatica, unspecified side: Secondary | ICD-10-CM | POA: Diagnosis not present

## 2021-12-29 LAB — POCT URINALYSIS DIP (CLINITEK)
Bilirubin, UA: NEGATIVE
Glucose, UA: NEGATIVE mg/dL
Ketones, POC UA: NEGATIVE mg/dL
Nitrite, UA: NEGATIVE
Spec Grav, UA: 1.02 (ref 1.010–1.025)
Urobilinogen, UA: 1 E.U./dL
pH, UA: 8.5 — AB (ref 5.0–8.0)

## 2021-12-29 MED ORDER — NITROFURANTOIN MONOHYD MACRO 100 MG PO CAPS: 100.0000 mg | ORAL_CAPSULE | Freq: Two times a day (BID) | ORAL | 0 refills | Status: AC

## 2021-12-29 NOTE — Telephone Encounter (Signed)
Lm for patient.  

## 2021-12-30 ENCOUNTER — Other Ambulatory Visit (HOSPITAL_COMMUNITY): Payer: Self-pay

## 2022-01-02 ENCOUNTER — Other Ambulatory Visit: Payer: Self-pay | Admitting: Neurosurgery

## 2022-01-02 ENCOUNTER — Other Ambulatory Visit (HOSPITAL_COMMUNITY): Payer: Self-pay | Admitting: Neurosurgery

## 2022-01-02 ENCOUNTER — Ambulatory Visit (HOSPITAL_COMMUNITY): Payer: Medicaid Other

## 2022-01-02 ENCOUNTER — Telehealth: Payer: Self-pay | Admitting: Orthopedic Surgery

## 2022-01-02 DIAGNOSIS — M5440 Lumbago with sciatica, unspecified side: Secondary | ICD-10-CM

## 2022-01-02 LAB — URINE CULTURE

## 2022-01-02 NOTE — Telephone Encounter (Signed)
Patient's surgery was canceled for 12/28/2021. She is needing a letter created with explanation. Please give me a reason and I will create letter for patient.  ?

## 2022-01-02 NOTE — Telephone Encounter (Signed)
Patient left a message stating she needs a note as to why her surgery was not performed on 12/28/21. Patient's surgery had to be postponed due to Anesthesia requiring surgery clearance from patient's Neurologist. Clearance was received today from Neurologist, and patient has been rescheduled for surgery on 01/11/22. Please call to advise patient. ?

## 2022-01-03 ENCOUNTER — Encounter
Payer: Medicaid Other | Attending: Physical Medicine and Rehabilitation | Admitting: Physical Medicine and Rehabilitation

## 2022-01-03 ENCOUNTER — Encounter: Payer: Self-pay | Admitting: Physical Medicine and Rehabilitation

## 2022-01-03 ENCOUNTER — Other Ambulatory Visit: Payer: Self-pay

## 2022-01-03 VITALS — BP 150/90 | HR 105 | Ht 64.0 in | Wt 278.0 lb

## 2022-01-03 DIAGNOSIS — E1142 Type 2 diabetes mellitus with diabetic polyneuropathy: Secondary | ICD-10-CM | POA: Insufficient documentation

## 2022-01-03 DIAGNOSIS — G629 Polyneuropathy, unspecified: Secondary | ICD-10-CM | POA: Insufficient documentation

## 2022-01-03 DIAGNOSIS — G894 Chronic pain syndrome: Secondary | ICD-10-CM | POA: Diagnosis not present

## 2022-01-03 MED ORDER — OXYCODONE-ACETAMINOPHEN 10-325 MG PO TABS
1.0000 | ORAL_TABLET | Freq: Three times a day (TID) | ORAL | 0 refills | Status: DC | PRN
Start: 2022-01-03 — End: 2022-02-01

## 2022-01-03 NOTE — Progress Notes (Signed)
? ?Subjective:  ? ? Patient ID: Jill HighmanJulie M Shaw, female    DOB: Oct 27, 1975, 46 y.o.   MRN: [id number]  ? ?HPI:   ? ? Jill HighmanJulie M Shaw is a 46 y.o. female who returns for f/u appointment for chronic pain, diabetic peripheral neuropathy, and insomnia.  ? ?1) Insomnia: ?-She continues to experience insomnia at night. Asks about Ambien and I discussed that this is an addictive medication and there are other safer options we can try first that can also help with her pain.  ?-She is currently not taking either of these medications, or Amitriptyline or Trazodone.  ?-She takes Requip for resltless legs but this does not help. ?-She is still sleeping very poorly ?-She does use screens before bed time ? ?2) Back pain secondary to lumbar radiculitis.  ?-She states her pain is located in her lower back radiating into her bilateral lower extremities and bilateral knee pain. She denies falling. She rates her pain 9. Her current exercise regime is walking.  ?-Jill Shaw equivalent is 20.00 MME.  ?-She was hospitalized for serotonin syndrome after use of Savella and Cymbalta.  ?-Back pain has been severe ?-She requests a renewal of her handicap placard as she is unable to walk 200 feet without stopping to rest.  ?-Her pain has been better controlled with Norco ?-she would like to get another XR given the chronicity of her pain.  ?-unable to work due to the severity of her pain ?-she used to work in [employer] as Conservation officer, naturecashier and in other roles as needed ? ?3) Diabetic peripheral neuropathy ?-She tolerated Qutenza well and this provided 2 weeks of good relief, benefit started right away.  ?-Her peripheral neuropathy is worse in her bilateral lower extremities, especially the dorsum and soles of both feet, including the toes.  ?-now present in her hands as well- she has been dropping objects due to her neuropathy ?-she is ready to try Qutenza again today ?-she is interested in following with a dietician ?-she is interested in  medicines for her diabetes ?-she is trying to lose weight  ? ?4) Right shoulder and arm pain ?-notes limited range of motion in her shoulder ?-does have pain radiating into right arm from neck and arm feels heavy at times ? ? ?Pain Inventory ?Average Pain 9 ?Pain Right Now 9 ?My pain is sharp, burning, tingling and aching ? ?In the last 24 hours, has pain interfered with the following? ?General activity 0 ?Relation with others 0 ?Enjoyment of life 5 ?What TIME of day is your pain at its worst? morning , daytime, evening and night ?Sleep (in general) Poor ? ?Pain is worse with: walking, bending, sitting, inactivity, standing and some activites ?Pain improves with: heat/ice and medication ?Relief from Meds: 10 ? ?Family History  ?Problem Relation Age of Onset  ? Diabetes Mother   ? Hypertension Mother   ? Migraines Paternal Grandfather   ? Colon cancer Neg Hx   ? Esophageal cancer Neg Hx   ? Stomach cancer Neg Hx   ? Rectal cancer Neg Hx   ? ?Social History  ? ?Socioeconomic History  ? Marital status: Legally Separated  ?  Spouse name: Not on file  ? Number of children: 3  ? Years of education: Not on file  ? Highest education level: Not on file  ?Occupational History  ? Occupation: unemployed  ?Tobacco Use  ? Smoking status: Some Days  ?  Packs/day: 0.25  ?  Years: 26.00  ?  Pack  years: 6.50  ?  Types: Cigarettes  ?  Last attempt to quit: 09/13/2020  ?  Years since quitting: 1.3  ? Smokeless tobacco: Never  ? Tobacco comments:  ?  4-5 cigarettes/day  ?Vaping Use  ? Vaping Use: Never used  ?Substance and Sexual Activity  ? Alcohol use: No  ? Drug use: No  ? Sexual activity: Not Currently  ?  Birth control/protection: None  ?Other Topics Concern  ? Not on file  ?Social History Narrative  ? Right Handed  ? Lives in a one story apartment, but lives on the second floor  ? Drinks caffeine once in awhile  ? ?Social Determinants of Health  ? ?Financial Resource Strain: Not on file  ?Food Insecurity: Food Insecurity Present   ? Worried About Programme researcher, broadcasting/film/video in the Last Year: Never true  ? Ran Out of Food in the Last Year: Sometimes true  ?Transportation Needs: No Transportation Needs  ? Lack of Transportation (Medical): No  ? Lack of Transportation (Non-Medical): No  ?Physical Activity: Not on file  ?Stress: Not on file  ?Social Connections: Not on file  ? ?Past Surgical History:  ?Procedure Laterality Date  ? CESAREAN SECTION    ? x 1. for twins  ? DILATION AND CURETTAGE OF UTERUS N/A 08/20/2019  ? Procedure: DILATATION AND CURETTAGE;  Surgeon: Allie Bossier, MD;  Location: [facility] OR;  Service: Gynecology;  Laterality: N/A;  ? ENDOMETRIAL ABLATION N/A 08/20/2019  ? Procedure: Minerva Ablation;  Surgeon: Allie Bossier, MD;  Location[facility];  Service: Gynecology;  Laterality: N/A;  ? EYE SURGERY Bilateral   ? laser right and cataract removed left eye  ? LEFT HEART CATH AND CORONARY ANGIOGRAPHY N/A 08/30/2021  ? Procedure: LEFT HEART CATH AND CORONARY ANGIOGRAPHY;  Surgeon: Elder Negus, MD;  Location: [facility] INVASIVE CV LAB;  Service: Cardiovascular;  Laterality: N/A;  ? RADIOLOGY WITH ANESTHESIA N/A 09/16/2019  ? Procedure: MRI WITH ANESTHESIA   L SPINE WITHOUT CONTRAST, T SPINE WITHOUT CONTRAST , CERVICAL WITHOUT CONTRAST;  Surgeon: Radiologist, Medication, MD;  Location: [facility] OR;  Service: Radiology;  Laterality: N/A;  ? TUBAL LIGATION    ? interval BTL  ? UPPER GI ENDOSCOPY  07/2017  ? ?Past Surgical History:  ?Procedure Laterality Date  ? CESAREAN SECTION    ? x 1. for twins  ? DILATION AND CURETTAGE OF UTERUS N/A 08/20/2019  ? Procedure: DILATATION AND CURETTAGE;  Surgeon: Allie Bossier, MD;  Location: [facility] OR;  Service: Gynecology;  Laterality: N/A;  ? ENDOMETRIAL ABLATION N/A 08/20/2019  ? Procedure: Minerva Ablation;  Surgeon: Allie Bossier, MD;  Location: [facility] OR;  Service: Gynecology;  Laterality: N/A;  ? EYE SURGERY Bilateral   ? laser right and cataract removed left eye  ? LEFT HEART CATH AND CORONARY ANGIOGRAPHY N/A 08/30/2021  ?  Procedure: LEFT HEART CATH AND CORONARY ANGIOGRAPHY;  Surgeon: Elder Negus, MD;  Location: [facility] INVASIVE CV LAB;  Service: Cardiovascular;  Laterality: N/A;  ? RADIOLOGY WITH ANESTHESIA N/A 09/16/2019  ? Procedure: MRI WITH ANESTHESIA   L SPINE WITHOUT CONTRAST, T SPINE WITHOUT CONTRAST , CERVICAL WITHOUT CONTRAST;  Surgeon: Radiologist, Medication, MD;  Location: [facility] OR;  Service: Radiology;  Laterality: N/A;  ? TUBAL LIGATION    ? interval BTL  ? UPPER GI ENDOSCOPY  07/2017  ? ?Past Medical History:  ?Diagnosis Date  ? Anemia   ? Arthritis   ? knees, hands  ? Asthma   ?  Chronic diastolic (congestive) heart failure (HCC)   ? COPD (chronic obstructive pulmonary disease) (HCC)   ? Diabetes mellitus without complication (HCC)   ? type 2  ? DKA (diabetic ketoacidosis) (HCC) 10/15/2020  ? Dysfunctional uterine bleeding   ? Gastritis   ? GERD (gastroesophageal reflux disease)   ? Hypertension   ? Neuromuscular disorder (HCC)   ? neuropathy feet  ? Seizures (HCC) 09/12/2017  ? pt states r/t stress and blood sugar - no meds last one 4 months ago, not seen neurologist  ? Sickle cell trait (HCC)   ? Smoker   ? Vitamin D deficiency 10/2019  ? Wears glasses   ? ?BP (!) 150/90   Pulse (!) 105   Ht 5\' 4"  (1.626 m)   Wt 278 lb (126.1 kg)   LMP 12/06/2021   SpO2 97%   BMI 47.72 kg/m?  ? ?Opioid Risk Score:   ?Fall Risk Score:  `1 ? ?Depression screen PHQ 2/9 ? ?Depression screen [facility] 2/9 01/03/2022 12/08/2021 11/11/2021 10/11/2021 09/30/2021 08/01/2021 07/29/2021  ?Decreased Interest 3 2 1 1  0 1 2  ?Down, Depressed, Hopeless 2 2 1  - 0 1 2  ?PHQ - 2 Score 5 4 2 1  0 2 4  ?Altered sleeping - 2 - - - - 3  ?Tired, decreased energy - 2 - - - - 2  ?Change in appetite - 0 - - - - 0  ?Feeling bad or failure about yourself  - - - - - - -  ?Trouble concentrating - 2 - - - - 2  ?Moving slowly or fidgety/restless - 0 - - - - 0  ?Suicidal thoughts - 0 - - - - 0  ?PHQ-9 Score - 10 - - - - 11  ?Some recent data might be hidden   ? ? ? ?Review of Systems  ?Constitutional:  Positive for diaphoresis and unexpected weight change.  ?Respiratory:  Positive for shortness of breath and wheezing.   ?Gastrointestinal:  Positive for abdominal pain, constipatio

## 2022-01-04 ENCOUNTER — Encounter (HOSPITAL_BASED_OUTPATIENT_CLINIC_OR_DEPARTMENT_OTHER): Payer: Self-pay | Admitting: Orthopedic Surgery

## 2022-01-04 ENCOUNTER — Other Ambulatory Visit: Payer: Self-pay | Admitting: Nurse Practitioner

## 2022-01-04 ENCOUNTER — Institutional Professional Consult (permissible substitution): Payer: Medicaid Other | Admitting: Adult Health

## 2022-01-04 ENCOUNTER — Other Ambulatory Visit: Payer: Self-pay

## 2022-01-04 DIAGNOSIS — N390 Urinary tract infection, site not specified: Secondary | ICD-10-CM

## 2022-01-04 DIAGNOSIS — B9620 Unspecified Escherichia coli [E. coli] as the cause of diseases classified elsewhere: Secondary | ICD-10-CM

## 2022-01-04 MED ORDER — NITROFURANTOIN MONOHYD MACRO 100 MG PO CAPS: 100.0000 mg | ORAL_CAPSULE | Freq: Two times a day (BID) | ORAL | 0 refills | Status: AC

## 2022-01-05 ENCOUNTER — Telehealth: Payer: Self-pay | Admitting: *Deleted

## 2022-01-05 ENCOUNTER — Encounter: Payer: Self-pay | Admitting: Physical Medicine and Rehabilitation

## 2022-01-05 NOTE — Telephone Encounter (Signed)
Prior auth for oxycodone acetaminophen 10/325 #90 submitted to Medicaid via [place] [employer]  ?Confirmation #:[id number] W. Await response. ?

## 2022-01-06 ENCOUNTER — Ambulatory Visit (HOSPITAL_COMMUNITY): Admission: RE | Admit: 2022-01-06 | Payer: Medicaid Other | Source: Ambulatory Visit

## 2022-01-06 NOTE — Telephone Encounter (Signed)
Patient states she got the medication yesterday 01/06/23. ?

## 2022-01-09 ENCOUNTER — Encounter: Payer: Medicaid Other | Admitting: Orthopedic Surgery

## 2022-01-11 ENCOUNTER — Ambulatory Visit (INDEPENDENT_AMBULATORY_CARE_PROVIDER_SITE_OTHER): Payer: Medicaid Other | Admitting: Nurse Practitioner

## 2022-01-11 ENCOUNTER — Encounter (HOSPITAL_COMMUNITY): Payer: Self-pay | Admitting: Certified Registered Nurse Anesthetist

## 2022-01-11 ENCOUNTER — Encounter (HOSPITAL_BASED_OUTPATIENT_CLINIC_OR_DEPARTMENT_OTHER): Payer: Self-pay | Admitting: Orthopedic Surgery

## 2022-01-11 ENCOUNTER — Ambulatory Visit (HOSPITAL_BASED_OUTPATIENT_CLINIC_OR_DEPARTMENT_OTHER): Admission: RE | Admit: 2022-01-11 | Payer: Medicaid Other | Source: Ambulatory Visit | Admitting: Orthopedic Surgery

## 2022-01-11 ENCOUNTER — Other Ambulatory Visit: Payer: Self-pay

## 2022-01-11 VITALS — BP 146/74 | HR 94 | Temp 98.2°F | Ht 64.0 in | Wt 288.8 lb

## 2022-01-11 DIAGNOSIS — M62838 Other muscle spasm: Secondary | ICD-10-CM

## 2022-01-11 DIAGNOSIS — E119 Type 2 diabetes mellitus without complications: Secondary | ICD-10-CM

## 2022-01-11 DIAGNOSIS — F419 Anxiety disorder, unspecified: Secondary | ICD-10-CM | POA: Diagnosis not present

## 2022-01-11 HISTORY — DX: Anxiety disorder, unspecified: F41.9

## 2022-01-11 SURGERY — RELEASE, A1 PULLEY, FOR TRIGGER FINGER
Anesthesia: Monitor Anesthesia Care | Site: Thumb | Laterality: Right

## 2022-01-11 MED ORDER — LIDOCAINE 2% (20 MG/ML) 5 ML SYRINGE
INTRAMUSCULAR | Status: AC
  Filled 2022-01-11: qty 5

## 2022-01-11 MED ORDER — PROPOFOL 10 MG/ML IV BOLUS
INTRAVENOUS | Status: AC
  Filled 2022-01-11: qty 20

## 2022-01-11 MED ORDER — MIDAZOLAM HCL 2 MG/2ML IJ SOLN
INTRAMUSCULAR | Status: AC
  Filled 2022-01-11: qty 2

## 2022-01-11 MED ORDER — TIZANIDINE HCL 4 MG PO TABS: 6.0000 mg | ORAL_TABLET | Freq: Four times a day (QID) | ORAL | 3 refills | Status: DC | PRN

## 2022-01-11 MED ORDER — ONDANSETRON HCL 4 MG/2ML IJ SOLN
INTRAMUSCULAR | Status: AC
  Filled 2022-01-11: qty 2

## 2022-01-11 MED ORDER — DEXAMETHASONE SODIUM PHOSPHATE 10 MG/ML IJ SOLN
INTRAMUSCULAR | Status: AC
  Filled 2022-01-11: qty 1

## 2022-01-11 MED ORDER — ESCITALOPRAM OXALATE 10 MG PO TABS: 10.0000 mg | ORAL_TABLET | Freq: Every day | ORAL | 0 refills | Status: DC

## 2022-01-11 MED ORDER — FENTANYL CITRATE (PF) 100 MCG/2ML IJ SOLN
INTRAMUSCULAR | Status: AC
  Filled 2022-01-11: qty 2

## 2022-01-11 MED ORDER — KETOROLAC TROMETHAMINE 30 MG/ML IJ SOLN
INTRAMUSCULAR | Status: AC
  Filled 2022-01-11: qty 1

## 2022-01-11 NOTE — Anesthesia Preprocedure Evaluation (Deleted)
Anesthesia Evaluation  ? ? ?Airway ? ? ? ? ? ? ? Dental ?  ?Pulmonary ?asthma , COPD, Current Smoker and Patient abstained from smoking.,  ?  ? ? ? ? ? ? ? Cardiovascular ?hypertension,  ? ?Negative stress test and cath in 2022 ?  ?Neuro/Psych ?PSYCHIATRIC DISORDERS Anxiety Depression  Neuromuscular disease (carpal tunnel)   ? GI/Hepatic ?GERD  ,  ?Endo/Other  ?diabetes, Poorly Controlled, Type 2Morbid obesity ? Renal/GU ?  ? ?  ?Musculoskeletal ? ?(+) Arthritis , Osteoarthritis,   ? Abdominal ?  ?Peds ? Hematology ?  ?Anesthesia Other Findings ? ? Reproductive/Obstetrics ? ?  ? ? ? ? ? ? ? ? ? ? ? ? ? ?  ?  ? ? ? ? ? ? ? ? ?Anesthesia Physical ?Anesthesia Plan ? ?ASA: 3 ? ?Anesthesia Plan: MAC  ? ?Post-op Pain Management: Minimal or no pain anticipated  ? ?Induction: Intravenous ? ?PONV Risk Score and Plan: 1 and Ondansetron, Treatment may vary due to age or medical condition and Midazolam ? ?Airway Management Planned: Natural Airway and Simple Face Mask ? ?Additional Equipment: None ? ?Intra-op Plan:  ? ?Post-operative Plan:  ? ?Informed Consent: I have reviewed the patients History and Physical, chart, labs and discussed the procedure including the risks, benefits and alternatives for the proposed anesthesia with the patient or authorized representative who has indicated his/her understanding and acceptance.  ? ? ? ? ? ?Plan Discussed with: CRNA and Anesthesiologist ? ?Anesthesia Plan Comments: (Local by surgeon. MAC. Propofol gtt. Norton Blizzard, MD  ?)  ? ? ? ? ? ? ?Anesthesia Quick Evaluation ? ?

## 2022-01-11 NOTE — Patient Instructions (Addendum)
1. Anxiety ? ?- escitalopram (LEXAPRO) 10 MG tablet; Take 1 tablet (10 mg total) by mouth daily.  Dispense: 30 tablet; Refill: 0 ? ?May continue vistaril ? ?2. Muscle spasms: ? ?May take 1 & 1/2 tablets of tizanidine Q6 hours - total of 6 mg per dose ? ?Follow up: ? ?Follow up in 4 weeks - anxiety - awaiting psychiatry appointment ? ?

## 2022-01-11 NOTE — Progress Notes (Signed)
$'@Patient'U$  ID: Jill Shaw, female    DOB: 1976-06-17, 46 y.o.   MRN: [id number] ? ?Chief Complaint  ?Patient presents with  ? OFFICE VISIT  ?  Pt is here do discuss MRI and refill on medications also pt is requesting to go back on Xanax   ? ? ?Referring provider: ?Fenton Foy, NP ? ?46 year old female with history of diabetes, hyperlipidemia, hypertension, current smoker, asthma, COPD, lumbar radiculopathy, neuropathy, chronic pain. ? ?HPI ? ?Patient presents today to have disability forms completed.  She states that she has been out of work since 2018 due to her multiple medical conditions.  Patient states that she has recently had frequent falls and dizziness with tremors.  She does have a referral placed to neurology and does have an upcoming appointment for further evaluation with them.  Patient states that she does need her tizanidine refilled today.  Patient also states that she is having increased anxiety and would like to be started on some medication for anxiety.  Denies f/c/s, n/v/d, hemoptysis, PND, chest pain or edema. ? ? ? ? ? ? ?Allergies  ?Allergen Reactions  ? Amitriptyline Other (See Comments)  ?  Coma  ? Ketoprofen Nausea And Vomiting  ? Trazodone Nausea And Vomiting  ? Aspirin Nausea Only  ? Gabapentin Nausea And Vomiting and Other (See Comments)  ?  upset stomach  ? Ibuprofen Nausea And Vomiting  ? Liraglutide Nausea And Vomiting  ? Naproxen Nausea And Vomiting  ? Omeprazole-Sodium Bicarbonate Nausea And Vomiting  ? Sulfa Antibiotics Nausea And Vomiting  ? Tramadol Nausea And Vomiting and Other (See Comments)  ?  stomach upset  ? ? ?Immunization History  ?Administered Date(s) Administered  ? Influenza,inj,Quad PF,6+ Mos 09/03/2018, 07/03/2019, 07/04/2020, 07/01/2021  ? Pneumococcal Polysaccharide-23 03/28/2015, 07/04/2020  ? Tdap 03/27/2016  ? Unspecified SARS-COV-2 Vaccination 03/25/2020, 05/05/2020  ? ? ?Past Medical History:  ?Diagnosis Date  ? Anemia   ? Anxiety   ? Arthritis   ?  knees, hands  ? Asthma   ? Chronic diastolic (congestive) heart failure (HCC)   ? COPD (chronic obstructive pulmonary disease) ([place])   ? Diabetes mellitus without complication ([place])   ? type 2  ? DKA (diabetic ketoacidosis) ([place]) 10/15/2020  ? Dysfunctional uterine bleeding   ? Gastritis   ? GERD (gastroesophageal reflux disease)   ? Hypertension   ? Neuromuscular disorder ([place])   ? neuropathy feet  ? Seizures ([place]) 09/12/2017  ? pt states r/t stress and blood sugar - no meds last one 4 months ago, not seen neurologist  ? Sickle cell trait ([place])   ? Smoker   ? Vitamin D deficiency 10/2019  ? Wears glasses   ? ? ?Tobacco History: ?Social History  ? ?Tobacco Use  ?Smoking Status Some Days  ? Packs/day: 0.25  ? Years: 26.00  ? Pack years: 6.50  ? Types: Cigarettes  ? Last attempt to quit: 09/13/2020  ? Years since quitting: 1.3  ?Smokeless Tobacco Never  ?Tobacco Comments  ? 4-5 cigarettes/day  ? ?Ready to quit: Not Answered ?Counseling given: Not Answered ?Tobacco comments: 4-5 cigarettes/day ? ? ?Outpatient Encounter Medications as of 01/11/2022  ?Medication Sig  ? Accu-Chek Softclix Lancets lancets USE AS DIRECTED UP TO 4 TIMES DAILY.  ? albuterol (PROVENTIL) (2.5 MG/3ML) 0.083% nebulizer solution Take 3 mLs (2.5 mg total) by nebulization every 6 (six) hours as needed for wheezing or shortness of breath.  ? albuterol (VENTOLIN HFA) 108 (90 Base) MCG/ACT inhaler Inhale 2  puffs into the lungs every 6 (six) hours as needed for wheezing or shortness of breath.  ? blood glucose meter kit and supplies KIT 1 each by Other route See admin instructions. Dispense based on patient and insurance preference. Use up to four times daily as directed. (FOR ICD-9 250.00, 250.01).  ? Blood Pressure Monitoring (ADULT BLOOD PRESSURE CUFF LG) KIT 1 kit by Does not apply route daily.  ? budesonide-formoterol (SYMBICORT) 160-4.5 MCG/ACT inhaler INHALE 2 PUFFS INTO THE LUNGS 2 (TWO) TIMES DAILY.  ? cetirizine (ZYRTEC) 10 MG tablet Take 1  tablet (10 mg total) by mouth daily.  ? Continuous Blood Gluc Sensor (DEXCOM G6 SENSOR) MISC 1 Device by Does not apply route as directed.  ? Continuous Blood Gluc Transmit (DEXCOM G6 TRANSMITTER) MISC 1 Device by Does not apply route as directed.  ? Dexlansoprazole (DEXILANT) 30 MG capsule DR Take 1 capsule (30 mg total) by mouth daily.  ? empagliflozin (JARDIANCE) 25 MG TABS tablet Take 1 tablet (25 mg total) by mouth daily before breakfast.  ? escitalopram (LEXAPRO) 10 MG tablet Take 1 tablet (10 mg total) by mouth daily.  ? fluticasone (FLONASE) 50 MCG/ACT nasal spray Place 2 sprays into both nostrils daily.  ? furosemide (LASIX) 40 MG tablet TAKE 1 TABLET($RemoveBefor'40MG'[id number]$  TOTAL) BY MOUTH TWICE DAILY  ? Glucosamine Sulfate 1000 MG CAPS Take 1 capsule (1,000 mg total) by mouth 2 (two) times daily. (Patient taking differently: Take 1,000 mg by mouth 2 (two) times daily.)  ? glucose blood (ACCU-CHEK GUIDE) test strip USE AS DIRECTED UP TO FOUR TIMES DAILY  ? haloperidol (HALDOL) 5 MG tablet Take 0.5-1 tablets (2.5-5 mg total) by mouth every 8 (eight) hours as needed (For nausea/vomiting).  ? Heating Pads (HEATING PAD MOIST/DRY KING SZ) PADS 1 each by Does not apply route 4 (four) times daily as needed.  ? hydrochlorothiazide (MICROZIDE) 12.5 MG capsule TAKE 1 CAPSULE(12.5 MG) BY MOUTH DAILY  ? hydrocortisone 2.5 % cream Apply topically 2 (two) times daily. (Patient taking differently: Apply 1 application. topically 2 (two) times daily.)  ? hydroquinone 4 % cream APPLY TOPICALLY TWICE DAILY (Patient taking differently: Apply 1 application. topically 2 (two) times daily as needed (pain).)  ? hydrOXYzine (ATARAX) 25 MG tablet Take 1 tablet (25 mg total) by mouth every 8 (eight) hours as needed. Anxiety  ? hydrOXYzine (VISTARIL) 50 MG capsule Take 1 capsule (50 mg total) by mouth 3 (three) times daily.  ? insulin glargine (LANTUS SOLOSTAR) 100 UNIT/ML Solostar Pen Inject 110 Units into the skin daily.  ? insulin lispro  protamine-lispro (HUMALOG 75/25 MIX) (75-25) 100 UNIT/ML SUSP injection Inject into the skin.  ? Insulin Pen Needle 31G X 8 MM MISC 1 Device by Does not apply route in the morning, at noon, in the evening, and at bedtime.  ? losartan (COZAAR) 100 MG tablet TAKE 1 TABLET BY MOUTH EVERY DAY  ? metoCLOPramide (REGLAN) 5 MG tablet Take 1 tablet (5 mg total) by mouth every 12 (twelve) hours.  ? metoprolol succinate (TOPROL-XL) 100 MG 24 hr tablet Take 100 mg by mouth daily. Take with or immediately following a meal.  ? ondansetron (ZOFRAN) 4 MG tablet Take 1 tablet (4 mg total) by mouth every 8 (eight) hours as needed for nausea or vomiting.  ? oxyCODONE-acetaminophen (PERCOCET) 10-325 MG tablet Take 1 tablet by mouth 3 (three) times daily as needed for pain.  ? potassium chloride SA (KLOR-CON) 20 MEQ tablet TAKE 1 TABLET(20 MEQ) BY MOUTH DAILY (  Patient taking differently: Take 20 mEq by mouth daily.)  ? predniSONE (STERAPRED UNI-PAK 21 TAB) 5 MG (21) TBPK tablet Take as directed  ? spironolactone (ALDACTONE) 50 MG tablet TAKE 1 TABLET(50 MG) BY MOUTH DAILY (Patient taking differently: Take 50 mg by mouth daily.)  ? sucralfate (CARAFATE) 1 g tablet Take 1 tablet (1 g total) by mouth 4 (four) times daily -  with meals and at bedtime.  ? sucralfate (CARAFATE) 1 GM/10ML suspension Take 10 mLs (1 g total) by mouth 4 (four) times daily.  ? Tiotropium Bromide Monohydrate (SPIRIVA RESPIMAT) 2.5 MCG/ACT AERS Inhale 2 puffs into the lungs daily.  ? tiZANidine (ZANAFLEX) 4 MG tablet Take 1.5 tablets (6 mg total) by mouth every 6 (six) hours as needed for muscle spasms.  ? TOBRADEX ophthalmic solution Place 1 drop into the right eye 4 (four) times daily.  ? trifluoperazine (STELAZINE) 1 MG tablet Take by mouth.  ? isosorbide mononitrate (IMDUR) 30 MG 24 hr tablet Take 1 tablet (30 mg total) by mouth daily.  ? norethindrone (AYGESTIN) 5 MG tablet Take 2 tablets (10 mg total) by mouth 2 (two) times daily as needed.  ? rosuvastatin  (CRESTOR) 20 MG tablet Take 1 tablet (20 mg total) by mouth daily.  ? saxagliptin HCl (ONGLYZA) 5 MG TABS tablet Take 1 tablet (5 mg total) by mouth daily.  ? ?No facility-administered encounter medications on file as of

## 2022-01-12 ENCOUNTER — Encounter: Payer: Self-pay | Admitting: Nurse Practitioner

## 2022-01-12 DIAGNOSIS — F419 Anxiety disorder, unspecified: Secondary | ICD-10-CM | POA: Insufficient documentation

## 2022-01-12 NOTE — Assessment & Plan Note (Signed)
-   escitalopram (LEXAPRO) 10 MG tablet; Take 1 tablet (10 mg total) by mouth daily.  Dispense: 30 tablet; Refill: 0 ? ?May continue vistaril ? ?2. Muscle spasms: ? ?May take 1 & 1/2 tablets of tizanidine Q6 hours - total of 6 mg per dose ? ?Follow up: ? ?Follow up in 4 weeks - anxiety - awaiting psychiatry appointment ?

## 2022-01-13 ENCOUNTER — Encounter: Payer: Self-pay | Admitting: Nurse Practitioner

## 2022-01-15 ENCOUNTER — Other Ambulatory Visit: Payer: Self-pay | Admitting: Nurse Practitioner

## 2022-01-15 DIAGNOSIS — F419 Anxiety disorder, unspecified: Secondary | ICD-10-CM

## 2022-01-16 ENCOUNTER — Institutional Professional Consult (permissible substitution): Payer: Medicaid Other | Admitting: Adult Health

## 2022-01-17 ENCOUNTER — Other Ambulatory Visit (HOSPITAL_COMMUNITY): Payer: Self-pay

## 2022-01-17 ENCOUNTER — Telehealth: Payer: Self-pay | Admitting: Nurse Practitioner

## 2022-01-17 NOTE — Telephone Encounter (Signed)
Patient left VM on nurse line stating she had a mini stroke on 4/2 and is requesting a call back from provider regarding ordering an MRI with sedation  ?[phone] ?

## 2022-01-18 ENCOUNTER — Emergency Department (HOSPITAL_BASED_OUTPATIENT_CLINIC_OR_DEPARTMENT_OTHER): Payer: Medicaid Other

## 2022-01-18 ENCOUNTER — Telehealth: Payer: Self-pay

## 2022-01-18 ENCOUNTER — Other Ambulatory Visit: Payer: Self-pay

## 2022-01-18 ENCOUNTER — Emergency Department (HOSPITAL_BASED_OUTPATIENT_CLINIC_OR_DEPARTMENT_OTHER)
Admission: EM | Admit: 2022-01-18 | Discharge: 2022-01-19 | Disposition: A | Discharge status: Home / Self Care | Payer: Medicaid Other | Attending: Emergency Medicine | Admitting: Emergency Medicine

## 2022-01-18 ENCOUNTER — Encounter (HOSPITAL_BASED_OUTPATIENT_CLINIC_OR_DEPARTMENT_OTHER): Payer: Self-pay | Admitting: Emergency Medicine

## 2022-01-18 DIAGNOSIS — N3000 Acute cystitis without hematuria: Secondary | ICD-10-CM

## 2022-01-18 DIAGNOSIS — Z79899 Other long term (current) drug therapy: Secondary | ICD-10-CM | POA: Insufficient documentation

## 2022-01-18 DIAGNOSIS — Z794 Long term (current) use of insulin: Secondary | ICD-10-CM | POA: Diagnosis not present

## 2022-01-18 DIAGNOSIS — G8929 Other chronic pain: Secondary | ICD-10-CM

## 2022-01-18 DIAGNOSIS — R109 Unspecified abdominal pain: Secondary | ICD-10-CM | POA: Diagnosis not present

## 2022-01-18 LAB — COMPREHENSIVE METABOLIC PANEL
ALT: 7 U/L (ref 0–44)
AST: 13 U/L — ABNORMAL LOW (ref 15–41)
Albumin: 4.7 g/dL (ref 3.5–5.0)
Alkaline Phosphatase: 82 U/L (ref 38–126)
Anion gap: 14 (ref 5–15)
BUN: 15 mg/dL (ref 6–20)
CO2: 21 mmol/L — ABNORMAL LOW (ref 22–32)
Calcium: 10.2 mg/dL (ref 8.9–10.3)
Chloride: 104 mmol/L (ref 98–111)
Creatinine, Ser: 1.46 mg/dL — ABNORMAL HIGH (ref 0.44–1.00)
GFR, Estimated: 45 mL/min — ABNORMAL LOW (ref >60)
Glucose, Bld: 270 mg/dL — ABNORMAL HIGH (ref 70–99)
Potassium: 3.3 mmol/L — ABNORMAL LOW (ref 3.5–5.1)
Sodium: 139 mmol/L (ref 135–145)
Total Bilirubin: 0.5 mg/dL (ref 0.3–1.2)
Total Protein: 7.8 g/dL (ref 6.5–8.1)

## 2022-01-18 LAB — CBC
HCT: 41.5 % (ref 36.0–46.0)
Hemoglobin: 14 g/dL (ref 12.0–15.0)
MCH: 26.8 pg (ref 26.0–34.0)
MCHC: 33.7 g/dL (ref 30.0–36.0)
MCV: 79.3 fL — ABNORMAL LOW (ref 80.0–100.0)
Platelets: 291 10*3/uL (ref 150–400)
RBC: 5.23 MIL/uL — ABNORMAL HIGH (ref 3.87–5.11)
RDW: 18.9 % — ABNORMAL HIGH (ref 11.5–15.5)
WBC: 11.7 10*3/uL — ABNORMAL HIGH (ref 4.0–10.5)
nRBC: 0 % (ref 0.0–0.2)

## 2022-01-18 LAB — LIPASE, BLOOD: Lipase: 21 U/L (ref 11–51)

## 2022-01-18 MED ORDER — SODIUM CHLORIDE 0.9 % IV BOLUS
500.0000 mL | Freq: Once | INTRAVENOUS | Status: AC
  Administered 2022-01-18: 500 mL via INTRAVENOUS

## 2022-01-18 NOTE — ED Triage Notes (Signed)
Patient reports abdominal pain, and n/v/d since last Thursday. ?

## 2022-01-18 NOTE — Telephone Encounter (Signed)
Jill Shaw has returned the Disability Parking Placard form for completion. Please call her for pick up. After you have checked the appropriate conditions.  ? ?Call back phone [phone] ?Thank you ?

## 2022-01-18 NOTE — ED Notes (Signed)
Patient reports she has a history of inflammatory gastritis and this feels the same as when she has her flare ups.  ?

## 2022-01-19 ENCOUNTER — Encounter (HOSPITAL_BASED_OUTPATIENT_CLINIC_OR_DEPARTMENT_OTHER): Payer: Self-pay | Admitting: Emergency Medicine

## 2022-01-19 DIAGNOSIS — R109 Unspecified abdominal pain: Secondary | ICD-10-CM | POA: Diagnosis not present

## 2022-01-19 LAB — URINALYSIS, ROUTINE W REFLEX MICROSCOPIC
Bilirubin Urine: NEGATIVE
Glucose, UA: 100 mg/dL — AB
Hgb urine dipstick: NEGATIVE
Ketones, ur: NEGATIVE mg/dL
Nitrite: NEGATIVE
Protein, ur: 30 mg/dL — AB
Specific Gravity, Urine: 1.033 — ABNORMAL HIGH (ref 1.005–1.030)
WBC, UA: >50 WBC/hpf — ABNORMAL HIGH (ref 0–5)
pH: 5 (ref 5.0–8.0)

## 2022-01-19 LAB — PREGNANCY, URINE: Preg Test, Ur: NEGATIVE

## 2022-01-19 MED ORDER — ONDANSETRON HCL 4 MG/2ML IJ SOLN: 4.0000 mg | Freq: Once | INTRAMUSCULAR | Status: DC

## 2022-01-19 MED ORDER — FENTANYL CITRATE PF 50 MCG/ML IJ SOSY
50.0000 ug | PREFILLED_SYRINGE | Freq: Once | INTRAMUSCULAR | Status: AC
  Administered 2022-01-19: 50 ug via INTRAVENOUS
  Filled 2022-01-19: qty 1

## 2022-01-19 MED ORDER — CEFTRIAXONE SODIUM 1 G IJ SOLR
1.0000 g | Freq: Once | INTRAMUSCULAR | Status: AC
  Administered 2022-01-19: 1 g via INTRAVENOUS
  Filled 2022-01-19: qty 10

## 2022-01-19 MED ORDER — HALOPERIDOL LACTATE 5 MG/ML IJ SOLN
5.0000 mg | Freq: Once | INTRAMUSCULAR | Status: AC
  Administered 2022-01-19: 5 mg via INTRAVENOUS
  Filled 2022-01-19: qty 1

## 2022-01-19 MED ORDER — DICYCLOMINE HCL 20 MG PO TABS: 20.0000 mg | ORAL_TABLET | Freq: Two times a day (BID) | ORAL | 0 refills | Status: DC

## 2022-01-19 MED ORDER — KETOROLAC TROMETHAMINE 30 MG/ML IJ SOLN
30.0000 mg | Freq: Once | INTRAMUSCULAR | Status: DC
Start: 2022-01-19 — End: 2022-01-19

## 2022-01-19 MED ORDER — ALUM & MAG HYDROXIDE-SIMETH 200-200-20 MG/5ML PO SUSP
30.0000 mL | Freq: Once | ORAL | Status: AC
Start: 2022-01-19 — End: 2022-01-19
  Administered 2022-01-19: 30 mL via ORAL
  Filled 2022-01-19: qty 30

## 2022-01-19 MED ORDER — CEPHALEXIN 500 MG PO CAPS: 500.0000 mg | ORAL_CAPSULE | Freq: Four times a day (QID) | ORAL | 0 refills | Status: DC

## 2022-01-19 MED ORDER — DICYCLOMINE HCL 10 MG/ML IM SOLN
20.0000 mg | Freq: Once | INTRAMUSCULAR | Status: AC
  Administered 2022-01-19: 20 mg via INTRAMUSCULAR
  Filled 2022-01-19: qty 2

## 2022-01-19 NOTE — ED Provider Notes (Signed)
?[facility] ?Provider Note ? ? ?CSN: [id number] ?Arrival date & time: 01/18/22  2053 ? ?  ? ?History ? ?Chief Complaint  ?Patient presents with  ? Vomiting  ? Diarrhea  ? ? ?Jill Shaw is a 46 y.o. female. ? ?The history is provided by the patient.  ?Diarrhea ?Quality:  Watery ?Severity:  Mild ?Onset quality:  Gradual ?Number of episodes:  3 ?Duration:  1 day ?Timing:  Sporadic ?Progression:  Unchanged ?Relieved by:  Nothing ?Worsened by:  Nothing ?Ineffective treatments:  None tried ?Associated symptoms: abdominal pain and vomiting   ?Associated symptoms: no fever and no URI   ?Associated symptoms comment:  Chronic abdominal pain with frequent ED visits for same  ?Risk factors: no recent antibiotic use   ?Patient with chronic abdominal pain presents with flair of same with vomiting x 1 and diarrhea.  No f/c/r.  On home narcotics for same.   ?  ? ?Home Medications ?Prior to Admission medications   ?Medication Sig Start Date End Date Taking? Authorizing Provider  ?cephALEXin (KEFLEX) 500 MG capsule Take 1 capsule (500 mg total) by mouth 4 (four) times daily. 01/19/22  Yes Lenus Trauger, MD  ?Accu-Chek Softclix Lancets lancets USE AS DIRECTED UP TO 4 TIMES DAILY. 11/15/21   Shamleffer, Melanie Crazier, MD  ?albuterol (PROVENTIL) (2.5 MG/3ML) 0.083% nebulizer solution Take 3 mLs (2.5 mg total) by nebulization every 6 (six) hours as needed for wheezing or shortness of breath. 07/18/21   Margaretha Seeds, MD  ?albuterol (VENTOLIN HFA) 108 (90 Base) MCG/ACT inhaler Inhale 2 puffs into the lungs every 6 (six) hours as needed for wheezing or shortness of breath. 07/18/21   Margaretha Seeds, MD  ?blood glucose meter kit and supplies KIT 1 each by Other route See admin instructions. Dispense based on patient and insurance preference. Use up to four times daily as directed. (FOR ICD-9 250.00, 250.01). 09/02/20   Vevelyn Francois, NP  ?Blood Pressure Monitoring (ADULT BLOOD PRESSURE CUFF LG) KIT 1  kit by Does not apply route daily. 05/05/21   Vevelyn Francois, NP  ?budesonide-formoterol (SYMBICORT) 160-4.5 MCG/ACT inhaler INHALE 2 PUFFS INTO THE LUNGS 2 (TWO) TIMES DAILY. 07/18/21   Margaretha Seeds, MD  ?cetirizine (ZYRTEC) 10 MG tablet Take 1 tablet (10 mg total) by mouth daily. 06/07/21   Vevelyn Francois, NP  ?Continuous Blood Gluc Sensor (DEXCOM G6 SENSOR) MISC 1 Device by Does not apply route as directed. 06/27/21   Shamleffer, Melanie Crazier, MD  ?Continuous Blood Gluc Transmit (DEXCOM G6 TRANSMITTER) MISC 1 Device by Does not apply route as directed. 06/27/21   Shamleffer, Melanie Crazier, MD  ?Dexlansoprazole (DEXILANT) 30 MG capsule DR Take 1 capsule (30 mg total) by mouth daily. 11/11/21 11/11/22  Pyrtle, Lajuan Lines, MD  ?empagliflozin (JARDIANCE) 25 MG TABS tablet Take 1 tablet (25 mg total) by mouth daily before breakfast. 06/27/21   Shamleffer, Melanie Crazier, MD  ?escitalopram (LEXAPRO) 10 MG tablet Take 1 tablet (10 mg total) by mouth daily. 01/11/22 02/10/22  Fenton Foy, NP  ?fluticasone (FLONASE) 50 MCG/ACT nasal spray Place 2 sprays into both nostrils daily. 12/02/20   Vevelyn Francois, NP  ?furosemide (LASIX) 40 MG tablet TAKE 1 TABLET(40MG TOTAL) BY MOUTH TWICE DAILY 08/25/21   Patwardhan, Reynold Bowen, MD  ?Glucosamine Sulfate 1000 MG CAPS Take 1 capsule (1,000 mg total) by mouth 2 (two) times daily. ?Patient taking differently: Take 1,000 mg by mouth 2 (two) times daily. 08/22/19  Hilts, Michael, MD  ?glucose blood (ACCU-CHEK GUIDE) test strip USE AS DIRECTED UP TO FOUR TIMES DAILY 07/29/21   Bo Merino I, NP  ?haloperidol (HALDOL) 5 MG tablet Take 0.5-1 tablets (2.5-5 mg total) by mouth every 8 (eight) hours as needed (For nausea/vomiting). 08/20/21   Molpus, John, MD  ?Heating Pads (HEATING PAD MOIST/DRY KING SZ) PADS 1 each by Does not apply route 4 (four) times daily as needed. 02/14/21   Raulkar, Clide Deutscher, MD  ?hydrochlorothiazide (MICROZIDE) 12.5 MG capsule TAKE 1 CAPSULE(12.5 MG) BY MOUTH  DAILY 12/14/21   Cantwell, Celeste C, PA-C  ?hydrocortisone 2.5 % cream Apply topically 2 (two) times daily. ?Patient taking differently: Apply 1 application. topically 2 (two) times daily. 09/22/20   Vevelyn Francois, NP  ?hydroquinone 4 % cream APPLY TOPICALLY TWICE DAILY ?Patient taking differently: Apply 1 application. topically 2 (two) times daily as needed (pain). 09/13/20   Vevelyn Francois, NP  ?hydrOXYzine (ATARAX) 25 MG tablet Take 1 tablet (25 mg total) by mouth every 8 (eight) hours as needed. Anxiety 09/15/21   Vevelyn Francois, NP  ?hydrOXYzine (VISTARIL) 50 MG capsule Take 1 capsule (50 mg total) by mouth 3 (three) times daily. 09/30/21 03/29/22  Vevelyn Francois, NP  ?insulin glargine (LANTUS SOLOSTAR) 100 UNIT/ML Solostar Pen Inject 110 Units into the skin daily. 06/27/21   Shamleffer, Melanie Crazier, MD  ?insulin lispro protamine-lispro (HUMALOG 75/25 MIX) (75-25) 100 UNIT/ML SUSP injection Inject into the skin.    [provider]  ?Insulin Pen Needle 31G X 8 MM MISC 1 Device by Does not apply route in the morning, at noon, in the evening, and at bedtime. 02/16/21   Shamleffer, Melanie Crazier, MD  ?isosorbide mononitrate (IMDUR) 30 MG 24 hr tablet Take 1 tablet (30 mg total) by mouth daily. 07/07/21 01/04/22  Patwardhan, Reynold Bowen, MD  ?losartan (COZAAR) 100 MG tablet TAKE 1 TABLET BY MOUTH EVERY DAY 10/18/21   Patwardhan, Reynold Bowen, MD  ?metoCLOPramide (REGLAN) 5 MG tablet Take 1 tablet (5 mg total) by mouth every 12 (twelve) hours. 11/16/21   Pyrtle, Lajuan Lines, MD  ?metoprolol succinate (TOPROL-XL) 100 MG 24 hr tablet Take 100 mg by mouth daily. Take with or immediately following a meal.    [provider]  ?norethindrone (AYGESTIN) 5 MG tablet Take 2 tablets (10 mg total) by mouth 2 (two) times daily as needed. 10/04/21 01/04/22  Aletha Halim, MD  ?ondansetron (ZOFRAN) 4 MG tablet Take 1 tablet (4 mg total) by mouth every 8 (eight) hours as needed for nausea or vomiting. 11/11/21   Pyrtle, Lajuan Lines,  MD  ?oxyCODONE-acetaminophen (PERCOCET) 10-325 MG tablet Take 1 tablet by mouth 3 (three) times daily as needed for pain. 01/03/22 01/03/23  Izora Ribas, MD  ?potassium chloride SA (KLOR-CON) 20 MEQ tablet TAKE 1 TABLET(20 MEQ) BY MOUTH DAILY ?Patient taking differently: Take 20 mEq by mouth daily. 06/06/21   Lorretta Harp, MD  ?predniSONE (STERAPRED UNI-PAK 21 TAB) 5 MG (21) TBPK tablet Take as directed 11/08/21   Aundra Dubin, PA-C  ?rosuvastatin (CRESTOR) 20 MG tablet Take 1 tablet (20 mg total) by mouth daily. 07/07/21   Patwardhan, Reynold Bowen, MD  ?saxagliptin HCl (ONGLYZA) 5 MG TABS tablet Take 1 tablet (5 mg total) by mouth daily. 12/02/20 01/04/22  Vevelyn Francois, NP  ?spironolactone (ALDACTONE) 50 MG tablet TAKE 1 TABLET(50 MG) BY MOUTH DAILY ?Patient taking differently: Take 50 mg by mouth daily. 12/22/20   Patwardhan,  Manish J, MD  ?sucralfate (CARAFATE) 1 g tablet Take 1 tablet (1 g total) by mouth 4 (four) times daily -  with meals and at bedtime. 08/21/21   Isla Pence, MD  ?sucralfate (CARAFATE) 1 GM/10ML suspension Take 10 mLs (1 g total) by mouth 4 (four) times daily. 11/11/21   Pyrtle, Lajuan Lines, MD  ?Tiotropium Bromide Monohydrate (SPIRIVA RESPIMAT) 2.5 MCG/ACT AERS Inhale 2 puffs into the lungs daily. 07/18/21   Margaretha Seeds, MD  ?tiZANidine (ZANAFLEX) 4 MG tablet Take 1.5 tablets (6 mg total) by mouth every 6 (six) hours as needed for muscle spasms. 01/11/22 05/11/22  Fenton Foy, NP  ?Baird Cancer ophthalmic solution Place 1 drop into the right eye 4 (four) times daily. 04/30/21   [provider]  ?trifluoperazine (STELAZINE) 1 MG tablet Take by mouth. 10/06/21   [provider]  ?   ? ?Allergies    ?Amitriptyline, Ketoprofen, Trazodone, Aspirin, Gabapentin, Ibuprofen, Liraglutide, Naproxen, Omeprazole-sodium bicarbonate, Sulfa antibiotics, and Tramadol   ? ?Review of Systems   ?Review of Systems  ?Constitutional:  Negative for fever.  ?HENT:  Negative for congestion.    ?Eyes:  Negative for redness.  ?Respiratory:  Negative for shortness of breath, wheezing and stridor.   ?Cardiovascular:  Negative for chest pain.  ?Gastrointestinal:  Positive for abdominal pain, diarr

## 2022-01-20 ENCOUNTER — Other Ambulatory Visit: Payer: Self-pay

## 2022-01-20 ENCOUNTER — Emergency Department (HOSPITAL_COMMUNITY)
Admission: EM | Admit: 2022-01-20 | Discharge: 2022-01-20 | Disposition: A | Discharge status: Home / Self Care | Payer: Medicaid Other | Attending: Emergency Medicine | Admitting: Emergency Medicine

## 2022-01-20 DIAGNOSIS — R112 Nausea with vomiting, unspecified: Secondary | ICD-10-CM | POA: Insufficient documentation

## 2022-01-20 DIAGNOSIS — J449 Chronic obstructive pulmonary disease, unspecified: Secondary | ICD-10-CM | POA: Diagnosis not present

## 2022-01-20 DIAGNOSIS — Z794 Long term (current) use of insulin: Secondary | ICD-10-CM | POA: Diagnosis not present

## 2022-01-20 DIAGNOSIS — E119 Type 2 diabetes mellitus without complications: Secondary | ICD-10-CM | POA: Diagnosis not present

## 2022-01-20 DIAGNOSIS — R1013 Epigastric pain: Secondary | ICD-10-CM

## 2022-01-20 DIAGNOSIS — Z7951 Long term (current) use of inhaled steroids: Secondary | ICD-10-CM | POA: Insufficient documentation

## 2022-01-20 DIAGNOSIS — R197 Diarrhea, unspecified: Secondary | ICD-10-CM | POA: Insufficient documentation

## 2022-01-20 DIAGNOSIS — I509 Heart failure, unspecified: Secondary | ICD-10-CM | POA: Diagnosis not present

## 2022-01-20 DIAGNOSIS — E1143 Type 2 diabetes mellitus with diabetic autonomic (poly)neuropathy: Secondary | ICD-10-CM | POA: Insufficient documentation

## 2022-01-20 DIAGNOSIS — Y902 Blood alcohol level of 40-59 mg/100 ml: Secondary | ICD-10-CM | POA: Diagnosis not present

## 2022-01-20 DIAGNOSIS — Z87891 Personal history of nicotine dependence: Secondary | ICD-10-CM | POA: Diagnosis not present

## 2022-01-20 LAB — URINALYSIS, ROUTINE W REFLEX MICROSCOPIC
Bacteria, UA: NONE SEEN
Bilirubin Urine: NEGATIVE
Glucose, UA: 150 mg/dL — AB
Ketones, ur: NEGATIVE mg/dL
Nitrite: NEGATIVE
Protein, ur: NEGATIVE mg/dL
Specific Gravity, Urine: 1.015 (ref 1.005–1.030)
pH: 5 (ref 5.0–8.0)

## 2022-01-20 LAB — CBC WITH DIFFERENTIAL/PLATELET
Abs Immature Granulocytes: 0.05 10*3/uL (ref 0.00–0.07)
Basophils Absolute: 0.1 10*3/uL (ref 0.0–0.1)
Basophils Relative: 1 %
Eosinophils Absolute: 0.2 10*3/uL (ref 0.0–0.5)
Eosinophils Relative: 1 %
HCT: 45.4 % (ref 36.0–46.0)
Hemoglobin: 15.8 g/dL — ABNORMAL HIGH (ref 12.0–15.0)
Immature Granulocytes: 0 %
Lymphocytes Relative: 31 %
Lymphs Abs: 4.2 10*3/uL — ABNORMAL HIGH (ref 0.7–4.0)
MCH: 27.2 pg (ref 26.0–34.0)
MCHC: 34.8 g/dL (ref 30.0–36.0)
MCV: 78.3 fL — ABNORMAL LOW (ref 80.0–100.0)
Monocytes Absolute: 0.8 10*3/uL (ref 0.1–1.0)
Monocytes Relative: 6 %
Neutro Abs: 8.3 10*3/uL — ABNORMAL HIGH (ref 1.7–7.7)
Neutrophils Relative %: 61 %
Platelets: 323 10*3/uL (ref 150–400)
RBC: 5.8 MIL/uL — ABNORMAL HIGH (ref 3.87–5.11)
RDW: 18.8 % — ABNORMAL HIGH (ref 11.5–15.5)
WBC: 13.6 10*3/uL — ABNORMAL HIGH (ref 4.0–10.5)
nRBC: 0 % (ref 0.0–0.2)

## 2022-01-20 LAB — BASIC METABOLIC PANEL
Anion gap: 15 (ref 5–15)
BUN: 11 mg/dL (ref 6–20)
CO2: 21 mmol/L — ABNORMAL LOW (ref 22–32)
Calcium: 9.4 mg/dL (ref 8.9–10.3)
Chloride: 104 mmol/L (ref 98–111)
Creatinine, Ser: 1.4 mg/dL — ABNORMAL HIGH (ref 0.44–1.00)
GFR, Estimated: 47 mL/min — ABNORMAL LOW (ref >60)
Glucose, Bld: 204 mg/dL — ABNORMAL HIGH (ref 70–99)
Potassium: 3.6 mmol/L (ref 3.5–5.1)
Sodium: 140 mmol/L (ref 135–145)

## 2022-01-20 LAB — COMPREHENSIVE METABOLIC PANEL
ALT: 12 U/L (ref 0–44)
AST: 24 U/L (ref 15–41)
Albumin: 4.8 g/dL (ref 3.5–5.0)
Alkaline Phosphatase: 86 U/L (ref 38–126)
Anion gap: 17 — ABNORMAL HIGH (ref 5–15)
BUN: 8 mg/dL (ref 6–20)
CO2: 21 mmol/L — ABNORMAL LOW (ref 22–32)
Calcium: 10.3 mg/dL (ref 8.9–10.3)
Chloride: 104 mmol/L (ref 98–111)
Creatinine, Ser: 1.41 mg/dL — ABNORMAL HIGH (ref 0.44–1.00)
GFR, Estimated: 47 mL/min — ABNORMAL LOW (ref >60)
Glucose, Bld: 69 mg/dL — ABNORMAL LOW (ref 70–99)
Potassium: 3.1 mmol/L — ABNORMAL LOW (ref 3.5–5.1)
Sodium: 142 mmol/L (ref 135–145)
Total Bilirubin: 0.5 mg/dL (ref 0.3–1.2)
Total Protein: 8.5 g/dL — ABNORMAL HIGH (ref 6.5–8.1)

## 2022-01-20 LAB — RAPID URINE DRUG SCREEN, HOSP PERFORMED
Amphetamines: NOT DETECTED
Barbiturates: NOT DETECTED
Benzodiazepines: NOT DETECTED
Cocaine: NOT DETECTED
Opiates: NOT DETECTED
Tetrahydrocannabinol: NOT DETECTED

## 2022-01-20 LAB — ETHANOL: Alcohol, Ethyl (B): 54 mg/dL — ABNORMAL HIGH (ref <10)

## 2022-01-20 LAB — LIPASE, BLOOD: Lipase: 24 U/L (ref 11–51)

## 2022-01-20 MED ORDER — ONDANSETRON 4 MG PO TBDP
4.0000 mg | ORAL_TABLET | Freq: Once | ORAL | Status: AC
  Administered 2022-01-20: 4 mg via ORAL

## 2022-01-20 MED ORDER — FENTANYL CITRATE PF 50 MCG/ML IJ SOSY
50.0000 ug | PREFILLED_SYRINGE | Freq: Once | INTRAMUSCULAR | Status: AC
  Administered 2022-01-20: 50 ug via INTRAVENOUS
  Filled 2022-01-20: qty 1

## 2022-01-20 MED ORDER — ALUM & MAG HYDROXIDE-SIMETH 200-200-20 MG/5ML PO SUSP
30.0000 mL | Freq: Once | ORAL | Status: AC
Start: 2022-01-20 — End: 2022-01-20
  Administered 2022-01-20: 30 mL via ORAL
  Filled 2022-01-20: qty 30

## 2022-01-20 MED ORDER — LOPERAMIDE HCL 2 MG PO CAPS: 2.0000 mg | ORAL_CAPSULE | Freq: Four times a day (QID) | ORAL | 0 refills | Status: DC | PRN

## 2022-01-20 MED ORDER — DICYCLOMINE HCL 20 MG PO TABS: 20.0000 mg | ORAL_TABLET | Freq: Two times a day (BID) | ORAL | 0 refills | Status: DC

## 2022-01-20 MED ORDER — OXYCODONE-ACETAMINOPHEN 5-325 MG PO TABS
2.0000 | ORAL_TABLET | Freq: Once | ORAL | Status: AC
  Administered 2022-01-20: 2 via ORAL
  Filled 2022-01-20: qty 2

## 2022-01-20 MED ORDER — LOPERAMIDE HCL 2 MG PO CAPS
4.0000 mg | ORAL_CAPSULE | Freq: Once | ORAL | Status: AC
  Administered 2022-01-20: 4 mg via ORAL
  Filled 2022-01-20: qty 2

## 2022-01-20 MED ORDER — LACTATED RINGERS IV BOLUS: 2000.0000 mL | Freq: Once | INTRAVENOUS | Status: DC

## 2022-01-20 MED ORDER — PANTOPRAZOLE SODIUM 40 MG PO TBEC: 40.0000 mg | DELAYED_RELEASE_TABLET | Freq: Every day | ORAL | 0 refills | Status: DC

## 2022-01-20 MED ORDER — DEXTROSE 50 % IV SOLN
50.0000 mL | Freq: Once | INTRAVENOUS | Status: AC
Start: 2022-01-20 — End: 2022-01-20
  Administered 2022-01-20: 50 mL via INTRAVENOUS
  Filled 2022-01-20: qty 50

## 2022-01-20 MED ORDER — HALOPERIDOL LACTATE 5 MG/ML IJ SOLN
5.0000 mg | Freq: Once | INTRAMUSCULAR | Status: AC
Start: 2022-01-20 — End: 2022-01-20
  Administered 2022-01-20: 5 mg via INTRAVENOUS
  Filled 2022-01-20: qty 1

## 2022-01-20 MED ORDER — METOCLOPRAMIDE HCL 10 MG PO TABS
10.0000 mg | ORAL_TABLET | Freq: Four times a day (QID) | ORAL | 0 refills | Status: DC | PRN
Start: 2022-01-20 — End: 2022-05-03

## 2022-01-20 MED ORDER — ONDANSETRON HCL 4 MG/2ML IJ SOLN
4.0000 mg | Freq: Once | INTRAMUSCULAR | Status: DC
Start: 2022-01-20 — End: 2022-01-20

## 2022-01-20 MED ORDER — SODIUM CHLORIDE 0.9 % IV BOLUS
1000.0000 mL | Freq: Once | INTRAVENOUS | Status: AC
  Administered 2022-01-20: 1000 mL via INTRAVENOUS

## 2022-01-20 MED ORDER — PROMETHAZINE HCL 25 MG RE SUPP: 25.0000 mg | Freq: Four times a day (QID) | RECTAL | 0 refills | Status: DC | PRN

## 2022-01-20 MED ORDER — LIDOCAINE VISCOUS HCL 2 % MT SOLN
15.0000 mL | Freq: Once | OROMUCOSAL | Status: AC
  Administered 2022-01-20: 15 mL via ORAL
  Filled 2022-01-20: qty 15

## 2022-01-20 NOTE — ED Triage Notes (Signed)
Pt c/o abd, nausea and vomiting since yesterday seen for same on 01/18/2022 ?

## 2022-01-20 NOTE — Discharge Instructions (Addendum)
Avoid ALL alcohol use as this can irritate your stomach and make you vomit! ?DO NOT TAKE aleve, advil, motrin as these can also cause stomach irritation. ?Continue frequent small sips (10-20 ml) of clear liquids every 5-10 minutes. Gatorade or powerade are good options. Avoid milk, orange juice, and grape juice for now. . Once you have not had further vomiting with the small sips for 4 hours, you may begin to drink larger volumes of fluids at a time and try a bland diet which may include saltine crackers, applesauce, breads, pastas, bananas, bland chicken. If you continues to vomit despite medication, return to the ED for repeat evaluation. Otherwise, follow up with your doctor in 2-3 days for a re-check. ? ?

## 2022-01-20 NOTE — ED Provider Triage Note (Signed)
Emergency Medicine Provider Triage Evaluation Note ? ?Jill Shaw , a 46 y.o. female  was evaluated in triage.  Pt complains of abdominal pain.  Patient has a reported history of inflammatory gastritis, seen yesterday in the ER with a similar complaint.  She had a CT renal stone study which was unremarkable, had a UA with large leukocytes, more than 50 WBCs, rare bacteria and proteinuria, was treated for UTI.  Patient states she had sudden onset of generalized abdominal pain about 4 hours ago.  She arrives slightly moaning in pain, does smell strongly of alcohol.  She has not taken anything for her symptoms.  She denies any diarrhea, no nausea or vomiting.  No known fevers or chills.  Denies any dysuria.  Denies any flank pain.  Per chart review, patient has had frequent visits to the ER with abdominal pain, multiple CTs that were overall negative, (noted recurrent geographic region of hyperechoic echogenicity on the right hepatic lobe) as well as a normal gastric emptying study performed in December 2022. ? ?Review of Systems  ?Positive: As above ?Negative: As above ? ?Physical Exam  ?BP (!) 169/110 (BP Location: Right Arm)   Pulse (!) 120   Temp 98.1 ?F (36.7 ?C) (Oral)   Resp 18   SpO2 99%  ?Gen:   Awake, no distress   ?Resp:  Normal effort  ?MSK:   Moves extremities without difficulty  ?Other:  Minimal generalized abdominal tenderness ? ?Medical Decision Making  ?Medically screening exam initiated at 6:13 AM.  Appropriate orders placed.  Jill Shaw was informed that the remainder of the evaluation will be completed by another provider, this initial triage assessment does not replace that evaluation, and the importance of remaining in the ED until their evaluation is complete. ? ?  ?Mare Ferrari, PA-C ?01/20/22 2458 ? ?

## 2022-01-20 NOTE — ED Provider Notes (Signed)
?[facility] ?Provider Note ? ? ?CSN: [id number] ?Arrival date & time: 01/20/22  0602 ? ?  ? ?History ? ?Chief Complaint  ?Patient presents with  ? Abdominal Pain  ? ? ?Jill Shaw is a 46 y.o. female with a past medical history of morbid obesity, gastroparesis, diabetes, CHF, COPD, GERD, alcohol use, tobacco dependence and recurrent intractable nausea and vomiting who presents the emergency department with nausea vomiting and diarrhea.  Patient is a difficult historian at this time as she is feeling distressed, hyperventilating and having difficulty calming herself down in order to answer questions.  Patient was seen 2 days ago for similar symptoms diagnosed with acute cystitis and started on keflex yesterday. Patient states that she began having epigastric abdominal pain nausea and vomiting last night.  She was drinking last night.  She admits to only 2 beers.  Patient states that she has had a severe pain, approximately 15 episodes of nonbloody nonbilious vomitus and voluminous watery diarrhea which she states is normal for her when she has these episodes.  She denies marijuana abuse.  This is a recurrent issue for her.  She denies fevers or chills. ? ?Abdominal Pain ? ?  ? ?Home Medications ?Prior to Admission medications   ?Medication Sig Start Date End Date Taking? Authorizing Provider  ?Accu-Chek Softclix Lancets lancets USE AS DIRECTED UP TO 4 TIMES DAILY. 11/15/21   Shamleffer, Melanie Crazier, MD  ?albuterol (PROVENTIL) (2.5 MG/3ML) 0.083% nebulizer solution Take 3 mLs (2.5 mg total) by nebulization every 6 (six) hours as needed for wheezing or shortness of breath. 07/18/21   Margaretha Seeds, MD  ?albuterol (VENTOLIN HFA) 108 (90 Base) MCG/ACT inhaler Inhale 2 puffs into the lungs every 6 (six) hours as needed for wheezing or shortness of breath. 07/18/21   Margaretha Seeds, MD  ?blood glucose meter kit and supplies KIT 1 each by Other route See admin instructions.  Dispense based on patient and insurance preference. Use up to four times daily as directed. (FOR ICD-9 250.00, 250.01). 09/02/20   Vevelyn Francois, NP  ?Blood Pressure Monitoring (ADULT BLOOD PRESSURE CUFF LG) KIT 1 kit by Does not apply route daily. 05/05/21   Vevelyn Francois, NP  ?budesonide-formoterol (SYMBICORT) 160-4.5 MCG/ACT inhaler INHALE 2 PUFFS INTO THE LUNGS 2 (TWO) TIMES DAILY. 07/18/21   Margaretha Seeds, MD  ?cephALEXin (KEFLEX) 500 MG capsule Take 1 capsule (500 mg total) by mouth 4 (four) times daily. 01/19/22   Palumbo, April, MD  ?cetirizine (ZYRTEC) 10 MG tablet Take 1 tablet (10 mg total) by mouth daily. 06/07/21   Vevelyn Francois, NP  ?Continuous Blood Gluc Sensor (DEXCOM G6 SENSOR) MISC 1 Device by Does not apply route as directed. 06/27/21   Shamleffer, Melanie Crazier, MD  ?Continuous Blood Gluc Transmit (DEXCOM G6 TRANSMITTER) MISC 1 Device by Does not apply route as directed. 06/27/21   Shamleffer, Melanie Crazier, MD  ?Dexlansoprazole (DEXILANT) 30 MG capsule DR Take 1 capsule (30 mg total) by mouth daily. 11/11/21 11/11/22  Pyrtle, Lajuan Lines, MD  ?dicyclomine (BENTYL) 20 MG tablet Take 1 tablet (20 mg total) by mouth 2 (two) times daily. 01/19/22   Palumbo, April, MD  ?empagliflozin (JARDIANCE) 25 MG TABS tablet Take 1 tablet (25 mg total) by mouth daily before breakfast. 06/27/21   Shamleffer, Melanie Crazier, MD  ?escitalopram (LEXAPRO) 10 MG tablet Take 1 tablet (10 mg total) by mouth daily. 01/11/22 02/10/22  Fenton Foy, NP  ?fluticasone Asencion Islam)  50 MCG/ACT nasal spray Place 2 sprays into both nostrils daily. 12/02/20   Vevelyn Francois, NP  ?furosemide (LASIX) 40 MG tablet TAKE 1 TABLET($RemoveBefor'40MG'tgHGmjNgABmj$  TOTAL) BY MOUTH TWICE DAILY 08/25/21   Patwardhan, Reynold Bowen, MD  ?Glucosamine Sulfate 1000 MG CAPS Take 1 capsule (1,000 mg total) by mouth 2 (two) times daily. ?Patient taking differently: Take 1,000 mg by mouth 2 (two) times daily. 08/22/19   Hilts, Michael, MD  ?glucose blood (ACCU-CHEK GUIDE) test strip  USE AS DIRECTED UP TO FOUR TIMES DAILY 07/29/21   Bo Merino I, NP  ?haloperidol (HALDOL) 5 MG tablet Take 0.5-1 tablets (2.5-5 mg total) by mouth every 8 (eight) hours as needed (For nausea/vomiting). 08/20/21   Molpus, John, MD  ?Heating Pads (HEATING PAD MOIST/DRY KING SZ) PADS 1 each by Does not apply route 4 (four) times daily as needed. 02/14/21   Raulkar, Clide Deutscher, MD  ?hydrochlorothiazide (MICROZIDE) 12.5 MG capsule TAKE 1 CAPSULE(12.5 MG) BY MOUTH DAILY 12/14/21   Cantwell, Celeste C, PA-C  ?hydrocortisone 2.5 % cream Apply topically 2 (two) times daily. ?Patient taking differently: Apply 1 application. topically 2 (two) times daily. 09/22/20   Vevelyn Francois, NP  ?hydroquinone 4 % cream APPLY TOPICALLY TWICE DAILY ?Patient taking differently: Apply 1 application. topically 2 (two) times daily as needed (pain). 09/13/20   Vevelyn Francois, NP  ?hydrOXYzine (ATARAX) 25 MG tablet Take 1 tablet (25 mg total) by mouth every 8 (eight) hours as needed. Anxiety 09/15/21   Vevelyn Francois, NP  ?hydrOXYzine (VISTARIL) 50 MG capsule Take 1 capsule (50 mg total) by mouth 3 (three) times daily. 09/30/21 03/29/22  Vevelyn Francois, NP  ?insulin glargine (LANTUS SOLOSTAR) 100 UNIT/ML Solostar Pen Inject 110 Units into the skin daily. 06/27/21   Shamleffer, Melanie Crazier, MD  ?insulin lispro protamine-lispro (HUMALOG 75/25 MIX) (75-25) 100 UNIT/ML SUSP injection Inject into the skin.    [provider]  ?Insulin Pen Needle 31G X 8 MM MISC 1 Device by Does not apply route in the morning, at noon, in the evening, and at bedtime. 02/16/21   Shamleffer, Melanie Crazier, MD  ?isosorbide mononitrate (IMDUR) 30 MG 24 hr tablet Take 1 tablet (30 mg total) by mouth daily. 07/07/21 01/04/22  Patwardhan, Reynold Bowen, MD  ?losartan (COZAAR) 100 MG tablet TAKE 1 TABLET BY MOUTH EVERY DAY 10/18/21   Patwardhan, Reynold Bowen, MD  ?metoCLOPramide (REGLAN) 5 MG tablet Take 1 tablet (5 mg total) by mouth every 12 (twelve) hours. 11/16/21    Pyrtle, Lajuan Lines, MD  ?metoprolol succinate (TOPROL-XL) 100 MG 24 hr tablet Take 100 mg by mouth daily. Take with or immediately following a meal.    [provider]  ?norethindrone (AYGESTIN) 5 MG tablet Take 2 tablets (10 mg total) by mouth 2 (two) times daily as needed. 10/04/21 01/04/22  Aletha Halim, MD  ?ondansetron (ZOFRAN) 4 MG tablet Take 1 tablet (4 mg total) by mouth every 8 (eight) hours as needed for nausea or vomiting. 11/11/21   Pyrtle, Lajuan Lines, MD  ?oxyCODONE-acetaminophen (PERCOCET) 10-325 MG tablet Take 1 tablet by mouth 3 (three) times daily as needed for pain. 01/03/22 01/03/23  Izora Ribas, MD  ?potassium chloride SA (KLOR-CON) 20 MEQ tablet TAKE 1 TABLET(20 MEQ) BY MOUTH DAILY ?Patient taking differently: Take 20 mEq by mouth daily. 06/06/21   Lorretta Harp, MD  ?predniSONE (STERAPRED UNI-PAK 21 TAB) 5 MG (21) TBPK tablet Take as directed 11/08/21   Aundra Dubin, PA-C  ?  rosuvastatin (CRESTOR) 20 MG tablet Take 1 tablet (20 mg total) by mouth daily. 07/07/21   Patwardhan, Reynold Bowen, MD  ?saxagliptin HCl (ONGLYZA) 5 MG TABS tablet Take 1 tablet (5 mg total) by mouth daily. 12/02/20 01/04/22  Vevelyn Francois, NP  ?spironolactone (ALDACTONE) 50 MG tablet TAKE 1 TABLET(50 MG) BY MOUTH DAILY ?Patient taking differently: Take 50 mg by mouth daily. 12/22/20   Patwardhan, Reynold Bowen, MD  ?sucralfate (CARAFATE) 1 g tablet Take 1 tablet (1 g total) by mouth 4 (four) times daily -  with meals and at bedtime. 08/21/21   Isla Pence, MD  ?sucralfate (CARAFATE) 1 GM/10ML suspension Take 10 mLs (1 g total) by mouth 4 (four) times daily. 11/11/21   Pyrtle, Lajuan Lines, MD  ?Tiotropium Bromide Monohydrate (SPIRIVA RESPIMAT) 2.5 MCG/ACT AERS Inhale 2 puffs into the lungs daily. 07/18/21   Margaretha Seeds, MD  ?tiZANidine (ZANAFLEX) 4 MG tablet Take 1.5 tablets (6 mg total) by mouth every 6 (six) hours as needed for muscle spasms. 01/11/22 05/11/22  Fenton Foy, NP  ?Baird Cancer ophthalmic solution Place 1  drop into the right eye 4 (four) times daily. 04/30/21   [provider]  ?trifluoperazine (STELAZINE) 1 MG tablet Take by mouth. 10/06/21   [provider]  ?   ? ?Allergies    ?Amitriptyline

## 2022-01-21 ENCOUNTER — Encounter (HOSPITAL_BASED_OUTPATIENT_CLINIC_OR_DEPARTMENT_OTHER): Payer: Self-pay | Admitting: Emergency Medicine

## 2022-01-21 ENCOUNTER — Emergency Department (HOSPITAL_BASED_OUTPATIENT_CLINIC_OR_DEPARTMENT_OTHER)
Admission: EM | Admit: 2022-01-21 | Discharge: 2022-01-22 | Disposition: A | Discharge status: Home / Self Care | Payer: Medicaid Other | Attending: Emergency Medicine | Admitting: Emergency Medicine

## 2022-01-21 DIAGNOSIS — R1013 Epigastric pain: Secondary | ICD-10-CM | POA: Diagnosis not present

## 2022-01-21 DIAGNOSIS — R1110 Vomiting, unspecified: Secondary | ICD-10-CM | POA: Diagnosis not present

## 2022-01-21 DIAGNOSIS — R000 Tachycardia, unspecified: Secondary | ICD-10-CM | POA: Diagnosis not present

## 2022-01-21 DIAGNOSIS — R1084 Generalized abdominal pain: Secondary | ICD-10-CM | POA: Insufficient documentation

## 2022-01-21 DIAGNOSIS — G8929 Other chronic pain: Secondary | ICD-10-CM | POA: Insufficient documentation

## 2022-01-21 DIAGNOSIS — E1165 Type 2 diabetes mellitus with hyperglycemia: Secondary | ICD-10-CM | POA: Insufficient documentation

## 2022-01-21 DIAGNOSIS — Z794 Long term (current) use of insulin: Secondary | ICD-10-CM | POA: Diagnosis not present

## 2022-01-21 DIAGNOSIS — Z7984 Long term (current) use of oral hypoglycemic drugs: Secondary | ICD-10-CM | POA: Diagnosis not present

## 2022-01-21 LAB — CBG MONITORING, ED: Glucose-Capillary: 383 mg/dL — ABNORMAL HIGH (ref 70–99)

## 2022-01-21 MED ORDER — LACTATED RINGERS IV BOLUS
1000.0000 mL | Freq: Once | INTRAVENOUS | Status: AC
  Administered 2022-01-21: 1000 mL via INTRAVENOUS

## 2022-01-21 MED ORDER — ALUM & MAG HYDROXIDE-SIMETH 200-200-20 MG/5ML PO SUSP
30.0000 mL | Freq: Once | ORAL | Status: AC
  Administered 2022-01-21: 30 mL via ORAL
  Filled 2022-01-21: qty 30

## 2022-01-21 MED ORDER — ONDANSETRON HCL 4 MG/2ML IJ SOLN
4.0000 mg | Freq: Once | INTRAMUSCULAR | Status: AC
  Administered 2022-01-21: 4 mg via INTRAVENOUS
  Filled 2022-01-21: qty 2

## 2022-01-21 NOTE — ED Triage Notes (Addendum)
Pt has been vomiting for several days but not today. She states she has been taking narcotics for back pain and has been able to keep those down today so not withdrawing. Diabetic and takes meds dx with inflammatory gastritis. She says meds not helping. Generalized diffuse belly pain, diarrhea yesterday but not today. She states she has been using compazine suppositories but then denies vomiting today.  ?

## 2022-01-21 NOTE — ED Provider Notes (Signed)
? ?[facility]  ?Provider Note ? ?CSN: [id number] ?Arrival date & time: 01/21/22 2304 ? ?History ?Chief Complaint  ?Patient presents with  ? Abdominal Pain  ? ? ?Jill Shaw is a 46 y.o. female with history of DM and chronic abdominal pain attributed to gastritis continues to drink alcohol was at the [facility] for abdominal pain and vomiting yesterday morning, had labs checked, given IVF, antiemetics and ultimately discharged. She reports her symptoms have been persistent since then. She reports continued epigastric pain and vomiting. She was asleep in the bed on my initial evaluation.  ? ? ?Home Medications ?Prior to Admission medications   ?Medication Sig Start Date End Date Taking? Authorizing Provider  ?Accu-Chek Softclix Lancets lancets USE AS DIRECTED UP TO 4 TIMES DAILY. 11/15/21   Shamleffer, Ibtehal Jaralla, MD  ?albuterol (PROVENTIL) (2.5 MG/3ML) 0.083% nebulizer solution Take 3 mLs (2.5 mg total) by nebulization every 6 (six) hours as needed for wheezing or shortness of breath. 07/18/21   Ellison, Chi Jane, MD  ?albuterol (VENTOLIN HFA) 108 (90 Base) MCG/ACT inhaler Inhale 2 puffs into the lungs every 6 (six) hours as needed for wheezing or shortness of breath. 07/18/21   Ellison, Chi Jane, MD  ?blood glucose meter kit and supplies KIT 1 each by Other route See admin instructions. Dispense based on patient and insurance preference. Use up to four times daily as directed. (FOR ICD-9 250.00, 250.01). 09/02/20   King, [name] M, NP  ?Blood Pressure Monitoring (ADULT BLOOD PRESSURE CUFF LG) KIT 1 kit by Does not apply route daily. 05/05/21   King, [name] M, NP  ?budesonide-formoterol (SYMBICORT) 160-4.5 MCG/ACT inhaler INHALE 2 PUFFS INTO THE LUNGS 2 (TWO) TIMES DAILY. 07/18/21   Ellison, Chi Jane, MD  ?cephALEXin (KEFLEX) 500 MG capsule Take 1 capsule (500 mg total) by mouth 4 (four) times daily. 01/19/22   Palumbo, April, MD  ?cetirizine (ZYRTEC) 10 MG tablet Take 1 tablet (10 mg total)  by mouth daily. 06/07/21   King, [name] M, NP  ?Continuous Blood Gluc Sensor (DEXCOM G6 SENSOR) MISC 1 Device by Does not apply route as directed. 06/27/21   Shamleffer, Ibtehal Jaralla, MD  ?Continuous Blood Gluc Transmit (DEXCOM G6 TRANSMITTER) MISC 1 Device by Does not apply route as directed. 06/27/21   Shamleffer, Ibtehal Jaralla, MD  ?Dexlansoprazole (DEXILANT) 30 MG capsule DR Take 1 capsule (30 mg total) by mouth daily. 11/11/21 11/11/22  Pyrtle, Jay M, MD  ?dicyclomine (BENTYL) 20 MG tablet Take 1 tablet (20 mg total) by mouth 2 (two) times daily. 01/20/22   Harris, Abigail, PA-C  ?empagliflozin (JARDIANCE) 25 MG TABS tablet Take 1 tablet (25 mg total) by mouth daily before breakfast. 06/27/21   Shamleffer, Ibtehal Jaralla, MD  ?escitalopram (LEXAPRO) 10 MG tablet Take 1 tablet (10 mg total) by mouth daily. 01/11/22 02/10/22  Nichols, Tonya S, NP  ?fluticasone (FLONASE) 50 MCG/ACT nasal spray Place 2 sprays into both nostrils daily. 12/02/20   King, [name] M, NP  ?furosemide (LASIX) 40 MG tablet TAKE 1 TABLET(40MG TOTAL) BY MOUTH TWICE DAILY 08/25/21   Patwardhan, Manish J, MD  ?Glucosamine Sulfate 1000 MG CAPS Take 1 capsule (1,000 mg total) by mouth 2 (two) times daily. ?Patient taking differently: Take 1,000 mg by mouth 2 (two) times daily. 08/22/19   Hilts, Michael, MD  ?glucose blood (ACCU-CHEK GUIDE) test strip USE AS DIRECTED UP TO FOUR TIMES DAILY 07/29/21   Passmore, Tewana I, NP  ?haloperidol (HALDOL) 5 MG tablet Take 0.5-1 tablets (  2.5-5 mg total) by mouth every 8 (eight) hours as needed (For nausea/vomiting). 08/20/21   Molpus, John, MD  ?Heating Pads (HEATING PAD MOIST/DRY KING SZ) PADS 1 each by Does not apply route 4 (four) times daily as needed. 02/14/21   Raulkar, Clide Deutscher, MD  ?hydrochlorothiazide (MICROZIDE) 12.5 MG capsule TAKE 1 CAPSULE(12.5 MG) BY MOUTH DAILY 12/14/21   Cantwell, Celeste C, PA-C  ?hydrocortisone 2.5 % cream Apply topically 2 (two) times daily. ?Patient taking differently: Apply 1  application. topically 2 (two) times daily. 09/22/20   Vevelyn Francois, NP  ?hydroquinone 4 % cream APPLY TOPICALLY TWICE DAILY ?Patient taking differently: Apply 1 application. topically 2 (two) times daily as needed (pain). 09/13/20   Vevelyn Francois, NP  ?hydrOXYzine (ATARAX) 25 MG tablet Take 1 tablet (25 mg total) by mouth every 8 (eight) hours as needed. Anxiety 09/15/21   Vevelyn Francois, NP  ?hydrOXYzine (VISTARIL) 50 MG capsule Take 1 capsule (50 mg total) by mouth 3 (three) times daily. 09/30/21 03/29/22  Vevelyn Francois, NP  ?insulin glargine (LANTUS SOLOSTAR) 100 UNIT/ML Solostar Pen Inject 110 Units into the skin daily. 06/27/21   Shamleffer, Melanie Crazier, MD  ?insulin lispro protamine-lispro (HUMALOG 75/25 MIX) (75-25) 100 UNIT/ML SUSP injection Inject into the skin.    [provider]  ?Insulin Pen Needle 31G X 8 MM MISC 1 Device by Does not apply route in the morning, at noon, in the evening, and at bedtime. 02/16/21   Shamleffer, Melanie Crazier, MD  ?isosorbide mononitrate (IMDUR) 30 MG 24 hr tablet Take 1 tablet (30 mg total) by mouth daily. 07/07/21 01/04/22  Patwardhan, Reynold Bowen, MD  ?loperamide (IMODIUM) 2 MG capsule Take 1 capsule (2 mg total) by mouth 4 (four) times daily as needed for diarrhea or loose stools. 01/20/22   Margarita Mail, PA-C  ?losartan (COZAAR) 100 MG tablet TAKE 1 TABLET BY MOUTH EVERY DAY 10/18/21   Patwardhan, Reynold Bowen, MD  ?metoCLOPramide (REGLAN) 10 MG tablet Take 1 tablet (10 mg total) by mouth every 6 (six) hours as needed for nausea (nausea/headache). 01/20/22   Margarita Mail, PA-C  ?metoCLOPramide (REGLAN) 5 MG tablet Take 1 tablet (5 mg total) by mouth every 12 (twelve) hours. 11/16/21   Pyrtle, Lajuan Lines, MD  ?metoprolol succinate (TOPROL-XL) 100 MG 24 hr tablet Take 100 mg by mouth daily. Take with or immediately following a meal.    [provider]  ?norethindrone (AYGESTIN) 5 MG tablet Take 2 tablets (10 mg total) by mouth 2 (two) times daily as needed.  10/04/21 01/04/22  Aletha Halim, MD  ?ondansetron (ZOFRAN) 4 MG tablet Take 1 tablet (4 mg total) by mouth every 8 (eight) hours as needed for nausea or vomiting. 11/11/21   Pyrtle, Lajuan Lines, MD  ?oxyCODONE-acetaminophen (PERCOCET) 10-325 MG tablet Take 1 tablet by mouth 3 (three) times daily as needed for pain. 01/03/22 01/03/23  Izora Ribas, MD  ?pantoprazole (PROTONIX) 40 MG tablet Take 1 tablet (40 mg total) by mouth daily. 01/20/22   Margarita Mail, PA-C  ?potassium chloride SA (KLOR-CON) 20 MEQ tablet TAKE 1 TABLET(20 MEQ) BY MOUTH DAILY ?Patient taking differently: Take 20 mEq by mouth daily. 06/06/21   Lorretta Harp, MD  ?predniSONE (STERAPRED UNI-PAK 21 TAB) 5 MG (21) TBPK tablet Take as directed 11/08/21   Aundra Dubin, PA-C  ?promethazine (PHENERGAN) 25 MG suppository Place 1 suppository (25 mg total) rectally every 6 (six) hours as needed for nausea or vomiting. 01/20/22  Harris, Abigail, PA-C  ?rosuvastatin (CRESTOR) 20 MG tablet Take 1 tablet (20 mg total) by mouth daily. 07/07/21   Patwardhan, Manish J, MD  ?saxagliptin HCl (ONGLYZA) 5 MG TABS tablet Take 1 tablet (5 mg total) by mouth daily. 12/02/20 01/04/22  King, [name] M, NP  ?spironolactone (ALDACTONE) 50 MG tablet TAKE 1 TABLET(50 MG) BY MOUTH DAILY ?Patient taking differently: Take 50 mg by mouth daily. 12/22/20   Patwardhan, Manish J, MD  ?sucralfate (CARAFATE) 1 g tablet Take 1 tablet (1 g total) by mouth 4 (four) times daily -  with meals and at bedtime. 08/21/21   Haviland, Akira, MD  ?sucralfate (CARAFATE) 1 GM/10ML suspension Take 10 mLs (1 g total) by mouth 4 (four) times daily. 11/11/21   Pyrtle, Jay M, MD  ?Tiotropium Bromide Monohydrate (SPIRIVA RESPIMAT) 2.5 MCG/ACT AERS Inhale 2 puffs into the lungs daily. 07/18/21   Ellison, Chi Jane, MD  ?tiZANidine (ZANAFLEX) 4 MG tablet Take 1.5 tablets (6 mg total) by mouth every 6 (six) hours as needed for muscle spasms. 01/11/22 05/11/22  Nichols, Tonya S, NP  ?TOBRADEX ophthalmic solution  Place 1 drop into the right eye 4 (four) times daily. 04/30/21   [provider]  ?trifluoperazine (STELAZINE) 1 MG tablet Take by mouth. 10/06/21   [provider]  ? ? ? ?Allergies    ?Amit

## 2022-01-22 LAB — ETHANOL: Alcohol, Ethyl (B): <10 mg/dL (ref <10)

## 2022-01-22 LAB — CBC WITH DIFFERENTIAL/PLATELET
Abs Immature Granulocytes: 0.05 10*3/uL (ref 0.00–0.07)
Basophils Absolute: 0.1 10*3/uL (ref 0.0–0.1)
Basophils Relative: 0 %
Eosinophils Absolute: 0 10*3/uL (ref 0.0–0.5)
Eosinophils Relative: 0 %
HCT: 38.8 % (ref 36.0–46.0)
Hemoglobin: 12.8 g/dL (ref 12.0–15.0)
Immature Granulocytes: 0 %
Lymphocytes Relative: 23 %
Lymphs Abs: 2.6 10*3/uL (ref 0.7–4.0)
MCH: 26.2 pg (ref 26.0–34.0)
MCHC: 33 g/dL (ref 30.0–36.0)
MCV: 79.5 fL — ABNORMAL LOW (ref 80.0–100.0)
Monocytes Absolute: 0.7 10*3/uL (ref 0.1–1.0)
Monocytes Relative: 6 %
Neutro Abs: 7.9 10*3/uL — ABNORMAL HIGH (ref 1.7–7.7)
Neutrophils Relative %: 71 %
Platelets: 241 10*3/uL (ref 150–400)
RBC: 4.88 MIL/uL (ref 3.87–5.11)
RDW: 18 % — ABNORMAL HIGH (ref 11.5–15.5)
WBC: 11.4 10*3/uL — ABNORMAL HIGH (ref 4.0–10.5)
nRBC: 0 % (ref 0.0–0.2)

## 2022-01-22 LAB — COMPREHENSIVE METABOLIC PANEL
ALT: 11 U/L (ref 0–44)
AST: 29 U/L (ref 15–41)
Albumin: 4.6 g/dL (ref 3.5–5.0)
Alkaline Phosphatase: 70 U/L (ref 38–126)
Anion gap: 16 — ABNORMAL HIGH (ref 5–15)
BUN: 19 mg/dL (ref 6–20)
CO2: 21 mmol/L — ABNORMAL LOW (ref 22–32)
Calcium: 9.9 mg/dL (ref 8.9–10.3)
Chloride: 99 mmol/L (ref 98–111)
Creatinine, Ser: 1.69 mg/dL — ABNORMAL HIGH (ref 0.44–1.00)
GFR, Estimated: 38 mL/min — ABNORMAL LOW (ref >60)
Glucose, Bld: 412 mg/dL — ABNORMAL HIGH (ref 70–99)
Potassium: 3.4 mmol/L — ABNORMAL LOW (ref 3.5–5.1)
Sodium: 136 mmol/L (ref 135–145)
Total Bilirubin: 1 mg/dL (ref 0.3–1.2)
Total Protein: 7.5 g/dL (ref 6.5–8.1)

## 2022-01-22 MED ORDER — HYDROMORPHONE HCL 1 MG/ML IJ SOLN
1.0000 mg | Freq: Once | INTRAMUSCULAR | Status: AC
  Administered 2022-01-22: 1 mg via INTRAVENOUS
  Filled 2022-01-22: qty 1

## 2022-01-22 NOTE — ED Notes (Signed)
Pt ambulated to restroom without distress.  

## 2022-01-23 ENCOUNTER — Telehealth: Payer: Self-pay

## 2022-01-23 ENCOUNTER — Other Ambulatory Visit: Payer: Self-pay

## 2022-01-23 ENCOUNTER — Emergency Department (HOSPITAL_COMMUNITY)
Admission: EM | Admit: 2022-01-23 | Discharge: 2022-01-23 | Disposition: A | Discharge status: Home / Self Care | Payer: Medicaid Other | Attending: Emergency Medicine | Admitting: Emergency Medicine

## 2022-01-23 ENCOUNTER — Telehealth: Payer: Self-pay | Admitting: Internal Medicine

## 2022-01-23 ENCOUNTER — Encounter: Payer: Self-pay | Admitting: Physical Medicine and Rehabilitation

## 2022-01-23 ENCOUNTER — Other Ambulatory Visit: Payer: Self-pay | Admitting: Cardiology

## 2022-01-23 ENCOUNTER — Encounter (HOSPITAL_COMMUNITY): Payer: Self-pay | Admitting: Emergency Medicine

## 2022-01-23 DIAGNOSIS — R630 Anorexia: Secondary | ICD-10-CM | POA: Insufficient documentation

## 2022-01-23 DIAGNOSIS — R112 Nausea with vomiting, unspecified: Secondary | ICD-10-CM | POA: Diagnosis not present

## 2022-01-23 DIAGNOSIS — G8929 Other chronic pain: Secondary | ICD-10-CM

## 2022-01-23 DIAGNOSIS — Z794 Long term (current) use of insulin: Secondary | ICD-10-CM | POA: Diagnosis not present

## 2022-01-23 DIAGNOSIS — Z79899 Other long term (current) drug therapy: Secondary | ICD-10-CM | POA: Insufficient documentation

## 2022-01-23 DIAGNOSIS — I10 Essential (primary) hypertension: Secondary | ICD-10-CM

## 2022-01-23 DIAGNOSIS — R1084 Generalized abdominal pain: Secondary | ICD-10-CM | POA: Diagnosis not present

## 2022-01-23 DIAGNOSIS — R1013 Epigastric pain: Secondary | ICD-10-CM | POA: Diagnosis not present

## 2022-01-23 DIAGNOSIS — R1110 Vomiting, unspecified: Secondary | ICD-10-CM

## 2022-01-23 DIAGNOSIS — R000 Tachycardia, unspecified: Secondary | ICD-10-CM | POA: Diagnosis not present

## 2022-01-23 LAB — COMPREHENSIVE METABOLIC PANEL
ALT: 19 U/L (ref 0–44)
AST: 29 U/L (ref 15–41)
Albumin: 4 g/dL (ref 3.5–5.0)
Alkaline Phosphatase: 78 U/L (ref 38–126)
Anion gap: 12 (ref 5–15)
BUN: 16 mg/dL (ref 6–20)
CO2: 23 mmol/L (ref 22–32)
Calcium: 9.2 mg/dL (ref 8.9–10.3)
Chloride: 101 mmol/L (ref 98–111)
Creatinine, Ser: 1.58 mg/dL — ABNORMAL HIGH (ref 0.44–1.00)
GFR, Estimated: 41 mL/min — ABNORMAL LOW (ref >60)
Glucose, Bld: 488 mg/dL — ABNORMAL HIGH (ref 70–99)
Potassium: 3.5 mmol/L (ref 3.5–5.1)
Sodium: 136 mmol/L (ref 135–145)
Total Bilirubin: 1.3 mg/dL — ABNORMAL HIGH (ref 0.3–1.2)
Total Protein: 7.1 g/dL (ref 6.5–8.1)

## 2022-01-23 LAB — CBG MONITORING, ED: Glucose-Capillary: 445 mg/dL — ABNORMAL HIGH (ref 70–99)

## 2022-01-23 LAB — RAPID URINE DRUG SCREEN, HOSP PERFORMED
Amphetamines: NOT DETECTED
Barbiturates: NOT DETECTED
Benzodiazepines: NOT DETECTED
Cocaine: NOT DETECTED
Opiates: POSITIVE — AB
Tetrahydrocannabinol: NOT DETECTED

## 2022-01-23 LAB — URINALYSIS, ROUTINE W REFLEX MICROSCOPIC
Bilirubin Urine: NEGATIVE
Glucose, UA: >=500 mg/dL — AB
Ketones, ur: 20 mg/dL — AB
Nitrite: NEGATIVE
Protein, ur: NEGATIVE mg/dL
Specific Gravity, Urine: 1.024 (ref 1.005–1.030)
pH: 6 (ref 5.0–8.0)

## 2022-01-23 LAB — CBC
HCT: 37.9 % (ref 36.0–46.0)
Hemoglobin: 12.8 g/dL (ref 12.0–15.0)
MCH: 27.4 pg (ref 26.0–34.0)
MCHC: 33.8 g/dL (ref 30.0–36.0)
MCV: 81 fL (ref 80.0–100.0)
Platelets: 188 10*3/uL (ref 150–400)
RBC: 4.68 MIL/uL (ref 3.87–5.11)
RDW: 17.4 % — ABNORMAL HIGH (ref 11.5–15.5)
WBC: 9.3 10*3/uL (ref 4.0–10.5)
nRBC: 0 % (ref 0.0–0.2)

## 2022-01-23 LAB — I-STAT BETA HCG BLOOD, ED (MC, WL, AP ONLY): I-stat hCG, quantitative: <5 m[IU]/mL (ref <5)

## 2022-01-23 LAB — LIPASE, BLOOD: Lipase: 26 U/L (ref 11–51)

## 2022-01-23 MED ORDER — HYDROMORPHONE HCL 1 MG/ML IJ SOLN
1.0000 mg | Freq: Once | INTRAMUSCULAR | Status: AC
  Administered 2022-01-23: 1 mg via INTRAVENOUS
  Filled 2022-01-23: qty 1

## 2022-01-23 MED ORDER — SODIUM CHLORIDE 0.9 % IV BOLUS
1000.0000 mL | Freq: Once | INTRAVENOUS | Status: AC
  Administered 2022-01-23: 1000 mL via INTRAVENOUS

## 2022-01-23 MED ORDER — HYDROMORPHONE HCL 1 MG/ML IJ SOLN
0.5000 mg | Freq: Once | INTRAMUSCULAR | Status: AC
  Administered 2022-01-23: 0.5 mg via INTRAVENOUS
  Filled 2022-01-23: qty 1

## 2022-01-23 MED ORDER — ONDANSETRON HCL 4 MG/2ML IJ SOLN
4.0000 mg | Freq: Once | INTRAMUSCULAR | Status: AC
  Administered 2022-01-23: 4 mg via INTRAVENOUS
  Filled 2022-01-23: qty 2

## 2022-01-23 NOTE — ED Notes (Signed)
Attempted to DC pt, mother stated "you need to wait until I get off the phone with this administrator before you say anything else she needs to stay here not be discharged". PA Sofia made aware of situation. PA discussed care further with pt. CBG taken, pt refused insulin.  ?

## 2022-01-23 NOTE — Telephone Encounter (Signed)
Call back phone [phone].  ? ?Jill Shaw called stating has been told her Oxycodone 10-325 MG needed to be taken more than 3 times a day. Because of her stomach pain. Patient stated she was seen recently in ED a few times.  ? ?Patient is aware you are not in the office today.  ?Call back phone [phone]. ?

## 2022-01-23 NOTE — Telephone Encounter (Signed)
Spoke with pt and let her know that Dr. Rhea Belton had stated that if she continued to have problems she was to follow-up with her PCP as he had not found an issue with her gallbladder. Pt aware. ?

## 2022-01-23 NOTE — ED Notes (Signed)
Pt tolerated PO fluids

## 2022-01-23 NOTE — ED Provider Notes (Addendum)
?[facility] ?Provider Note ? ? ?CSN: [id number] ?Arrival date & time: 01/23/22  1027 ? ?  ? ?History ? ?Chief Complaint  ?Patient presents with  ? Abdominal Pain  ? Emesis  ? Nausea  ? ? ?Jill Shaw is a 46 y.o. female. ? ?Pt complains of vomiting and abdominal pain.  Pt reports she has not been taking pain medications at home.  Pt reports she is out of hydrocodone and percocet is not working.  Pt in management for chronic pain.  Pt reports she did not take her insulin.  Pt states she does not take her insulin when she feels bad.  ? ?The history is provided by the patient. No language interpreter was used.  ?Abdominal Pain ?Pain location:  Generalized ?Pain quality: aching   ?Pain severity:  Moderate ?Onset quality:  Gradual ?Timing:  Constant ?Progression:  Worsening ?Chronicity:  New ?Context: alcohol use   ?Relieved by:  Nothing ?Worsened by:  Nothing ?Ineffective treatments:  None tried ?Associated symptoms: anorexia, nausea and vomiting   ?Emesis ?Severity:  Severe ?Duration:  5 days ?Timing:  Intermittent ?Progression:  Worsening ?Chronicity:  New ?Associated symptoms: abdominal pain   ? ?  ? ?Home Medications ?Prior to Admission medications   ?Medication Sig Start Date End Date Taking? Authorizing Provider  ?Accu-Chek Softclix Lancets lancets USE AS DIRECTED UP TO 4 TIMES DAILY. 11/15/21   Shamleffer, Melanie Crazier, MD  ?albuterol (PROVENTIL) (2.5 MG/3ML) 0.083% nebulizer solution Take 3 mLs (2.5 mg total) by nebulization every 6 (six) hours as needed for wheezing or shortness of breath. 07/18/21   Margaretha Seeds, MD  ?albuterol (VENTOLIN HFA) 108 (90 Base) MCG/ACT inhaler Inhale 2 puffs into the lungs every 6 (six) hours as needed for wheezing or shortness of breath. 07/18/21   Margaretha Seeds, MD  ?blood glucose meter kit and supplies KIT 1 each by Other route See admin instructions. Dispense based on patient and insurance preference. Use up to four times daily  as directed. (FOR ICD-9 250.00, 250.01). 09/02/20   Vevelyn Francois, NP  ?Blood Pressure Monitoring (ADULT BLOOD PRESSURE CUFF LG) KIT 1 kit by Does not apply route daily. 05/05/21   Vevelyn Francois, NP  ?budesonide-formoterol (SYMBICORT) 160-4.5 MCG/ACT inhaler INHALE 2 PUFFS INTO THE LUNGS 2 (TWO) TIMES DAILY. 07/18/21   Margaretha Seeds, MD  ?cephALEXin (KEFLEX) 500 MG capsule Take 1 capsule (500 mg total) by mouth 4 (four) times daily. 01/19/22   Palumbo, April, MD  ?cetirizine (ZYRTEC) 10 MG tablet Take 1 tablet (10 mg total) by mouth daily. 06/07/21   Vevelyn Francois, NP  ?Continuous Blood Gluc Sensor (DEXCOM G6 SENSOR) MISC 1 Device by Does not apply route as directed. 06/27/21   Shamleffer, Melanie Crazier, MD  ?Continuous Blood Gluc Transmit (DEXCOM G6 TRANSMITTER) MISC 1 Device by Does not apply route as directed. 06/27/21   Shamleffer, Melanie Crazier, MD  ?Dexlansoprazole (DEXILANT) 30 MG capsule DR Take 1 capsule (30 mg total) by mouth daily. 11/11/21 11/11/22  Pyrtle, Lajuan Lines, MD  ?dicyclomine (BENTYL) 20 MG tablet Take 1 tablet (20 mg total) by mouth 2 (two) times daily. 01/20/22   Margarita Mail, PA-C  ?empagliflozin (JARDIANCE) 25 MG TABS tablet Take 1 tablet (25 mg total) by mouth daily before breakfast. 06/27/21   Shamleffer, Melanie Crazier, MD  ?escitalopram (LEXAPRO) 10 MG tablet Take 1 tablet (10 mg total) by mouth daily. 01/11/22 02/10/22  Fenton Foy, NP  ?fluticasone Asencion Islam)  50 MCG/ACT nasal spray Place 2 sprays into both nostrils daily. 12/02/20   Vevelyn Francois, NP  ?furosemide (LASIX) 40 MG tablet TAKE 1 TABLET($RemoveBefor'40MG'[id number]$  TOTAL) BY MOUTH TWICE DAILY 08/25/21   Patwardhan, Reynold Bowen, MD  ?Glucosamine Sulfate 1000 MG CAPS Take 1 capsule (1,000 mg total) by mouth 2 (two) times daily. ?Patient taking differently: Take 1,000 mg by mouth 2 (two) times daily. 08/22/19   Hilts, Michael, MD  ?glucose blood (ACCU-CHEK GUIDE) test strip USE AS DIRECTED UP TO FOUR TIMES DAILY 07/29/21   Bo Merino I, NP   ?haloperidol (HALDOL) 5 MG tablet Take 0.5-1 tablets (2.5-5 mg total) by mouth every 8 (eight) hours as needed (For nausea/vomiting). 08/20/21   Molpus, John, MD  ?Heating Pads (HEATING PAD MOIST/DRY KING SZ) PADS 1 each by Does not apply route 4 (four) times daily as needed. 02/14/21   Raulkar, Clide Deutscher, MD  ?hydrochlorothiazide (MICROZIDE) 12.5 MG capsule TAKE 1 CAPSULE(12.5 MG) BY MOUTH DAILY 12/14/21   Cantwell, Celeste C, PA-C  ?hydrocortisone 2.5 % cream Apply topically 2 (two) times daily. ?Patient taking differently: Apply 1 application. topically 2 (two) times daily. 09/22/20   Vevelyn Francois, NP  ?hydroquinone 4 % cream APPLY TOPICALLY TWICE DAILY ?Patient taking differently: Apply 1 application. topically 2 (two) times daily as needed (pain). 09/13/20   Vevelyn Francois, NP  ?hydrOXYzine (ATARAX) 25 MG tablet Take 1 tablet (25 mg total) by mouth every 8 (eight) hours as needed. Anxiety 09/15/21   Vevelyn Francois, NP  ?hydrOXYzine (VISTARIL) 50 MG capsule Take 1 capsule (50 mg total) by mouth 3 (three) times daily. 09/30/21 03/29/22  Vevelyn Francois, NP  ?insulin glargine (LANTUS SOLOSTAR) 100 UNIT/ML Solostar Pen Inject 110 Units into the skin daily. 06/27/21   Shamleffer, Melanie Crazier, MD  ?insulin lispro protamine-lispro (HUMALOG 75/25 MIX) (75-25) 100 UNIT/ML SUSP injection Inject into the skin.    [provider]  ?Insulin Pen Needle 31G X 8 MM MISC 1 Device by Does not apply route in the morning, at noon, in the evening, and at bedtime. 02/16/21   Shamleffer, Melanie Crazier, MD  ?isosorbide mononitrate (IMDUR) 30 MG 24 hr tablet Take 1 tablet (30 mg total) by mouth daily. 07/07/21 01/04/22  Patwardhan, Reynold Bowen, MD  ?loperamide (IMODIUM) 2 MG capsule Take 1 capsule (2 mg total) by mouth 4 (four) times daily as needed for diarrhea or loose stools. 01/20/22   Margarita Mail, PA-C  ?losartan (COZAAR) 100 MG tablet TAKE 1 TABLET BY MOUTH EVERY DAY 10/18/21   Patwardhan, Reynold Bowen, MD  ?metoCLOPramide  (REGLAN) 10 MG tablet Take 1 tablet (10 mg total) by mouth every 6 (six) hours as needed for nausea (nausea/headache). 01/20/22   Margarita Mail, PA-C  ?metoCLOPramide (REGLAN) 5 MG tablet Take 1 tablet (5 mg total) by mouth every 12 (twelve) hours. 11/16/21   Pyrtle, Lajuan Lines, MD  ?metoprolol succinate (TOPROL-XL) 100 MG 24 hr tablet Take 100 mg by mouth daily. Take with or immediately following a meal.    [provider]  ?norethindrone (AYGESTIN) 5 MG tablet Take 2 tablets (10 mg total) by mouth 2 (two) times daily as needed. 10/04/21 01/04/22  Aletha Halim, MD  ?ondansetron (ZOFRAN) 4 MG tablet Take 1 tablet (4 mg total) by mouth every 8 (eight) hours as needed for nausea or vomiting. 11/11/21   Pyrtle, Lajuan Lines, MD  ?oxyCODONE-acetaminophen (PERCOCET) 10-325 MG tablet Take 1 tablet by mouth 3 (three) times daily as needed for  pain. 01/03/22 01/03/23  Izora Ribas, MD  ?pantoprazole (PROTONIX) 40 MG tablet Take 1 tablet (40 mg total) by mouth daily. 01/20/22   Margarita Mail, PA-C  ?potassium chloride SA (KLOR-CON) 20 MEQ tablet TAKE 1 TABLET(20 MEQ) BY MOUTH DAILY ?Patient taking differently: Take 20 mEq by mouth daily. 06/06/21   Lorretta Harp, MD  ?predniSONE (STERAPRED UNI-PAK 21 TAB) 5 MG (21) TBPK tablet Take as directed 11/08/21   Aundra Dubin, PA-C  ?promethazine (PHENERGAN) 25 MG suppository Place 1 suppository (25 mg total) rectally every 6 (six) hours as needed for nausea or vomiting. 01/20/22   Margarita Mail, PA-C  ?rosuvastatin (CRESTOR) 20 MG tablet Take 1 tablet (20 mg total) by mouth daily. 07/07/21   Patwardhan, Reynold Bowen, MD  ?saxagliptin HCl (ONGLYZA) 5 MG TABS tablet Take 1 tablet (5 mg total) by mouth daily. 12/02/20 01/04/22  Vevelyn Francois, NP  ?spironolactone (ALDACTONE) 50 MG tablet TAKE 1 TABLET(50 MG) BY MOUTH DAILY ?Patient taking differently: Take 50 mg by mouth daily. 12/22/20   Patwardhan, Reynold Bowen, MD  ?sucralfate (CARAFATE) 1 g tablet Take 1 tablet (1 g total) by mouth 4  (four) times daily -  with meals and at bedtime. 08/21/21   Isla Pence, MD  ?sucralfate (CARAFATE) 1 GM/10ML suspension Take 10 mLs (1 g total) by mouth 4 (four) times daily. 11/11/21   Pyrtle, Lajuan Lines, MD  ?T

## 2022-01-23 NOTE — ED Notes (Signed)
Pt provided with ginger ale 

## 2022-01-23 NOTE — Discharge Instructions (Signed)
Follow up with your Physician for recheck  

## 2022-01-23 NOTE — ED Provider Triage Note (Signed)
Emergency Medicine Provider Triage Evaluation Note ? ?Jill Shaw , a 46 y.o. female  was evaluated in triage.  Pt complains of generalized abdominal pain.  States she is not eating because it is worse when she eats.  Has been seen for this 3 times over the past 5 days.  Says it is not getting better.  Reports "I have inflammatory gastritis." ? ?Review of Systems  ?Positive: Abdominal pain ?Negative: Diarrhea and emesis ? ?Physical Exam  ?LMP 01/03/2022  ?Gen:   Awake, no distress   ?Resp:  Normal effort  ?MSK:   Moves extremities without difficulty  ?Other:  Generalized abdominal pain ? ?Medical Decision Making  ?Medically screening exam initiated at 10:45 AM.  Appropriate orders placed.  Jill Shaw was informed that the remainder of the evaluation will be completed by another provider, this initial triage assessment does not replace that evaluation, and the importance of remaining in the ED until their evaluation is complete. ? ? ?  ?Olivea Sonnen A, PA-C ?01/23/22 1046 ? ?

## 2022-01-23 NOTE — ED Triage Notes (Signed)
Patient from home, c/o n/v chills and gen weakness since last Thursday, was seen and sent home with suppository, states it did not help. ?

## 2022-01-23 NOTE — Telephone Encounter (Signed)
Inbound call from patient reports she is extreme abd pain have been to the ED 01/18/22, 01/20/2022, and 01/21/2022. On the telephone, can hardy speak because of the pain.  ?

## 2022-01-24 ENCOUNTER — Encounter
Payer: Medicaid Other | Attending: Physical Medicine and Rehabilitation | Admitting: Physical Medicine and Rehabilitation

## 2022-01-24 ENCOUNTER — Encounter: Payer: Medicaid Other | Admitting: Orthopedic Surgery

## 2022-01-24 DIAGNOSIS — K2970 Gastritis, unspecified, without bleeding: Secondary | ICD-10-CM | POA: Insufficient documentation

## 2022-01-24 NOTE — Progress Notes (Signed)
? ?Subjective:  ? ? Patient ID: Jill HighmanJulie M Shaw, female    DOB: 26-Sep-1976, 46 y.o.   MRN: [id number]  ? ?HPI:   ?An audio/video tele-health visit is felt to be the most appropriate encounter for this patient at this time. This is a follow up tele-visit via phone. The patient is at home. MD is at office. Prior to scheduling this appointment, our staff discussed the limitations of evaluation and management by telemedicine and the availability of in-person appointments. The patient expressed understanding and agreed to proceed.  ? ? Jill HighmanJulie M Shaw is a 46 y.o. female who returns for f/u appointment of inflammatory gastritis, chronic pain, diabetic peripheral neuropathy, and insomnia.  ? ?1) Insomnia: ?-She continues to experience insomnia at night. Asks about Ambien and I discussed that this is an addictive medication and there are other safer options we can try first that can also help with her pain.  ?-She is currently not taking either of these medications, or Amitriptyline or Trazodone.  ?-She takes Requip for resltless legs but this does not help. ?-She is still sleeping very poorly ?-She does use screens before bed time ? ?2) Back pain secondary to lumbar radiculitis.  ?-She states her pain is located in her lower back radiating into her bilateral lower extremities and bilateral knee pain. She denies falling. She rates her pain 9. Her current exercise regime is walking.  ?-Ms. Peragine Morphine equivalent is 20.00 MME.  ?-She was hospitalized for serotonin syndrome after use of Savella and Cymbalta.  ?-Back pain has been severe ?-She requests a renewal of her handicap placard as she is unable to walk 200 feet without stopping to rest.  ?-Her pain has been better controlled with Norco ?-she would like to get another XR given the chronicity of her pain.  ?-unable to work due to the severity of her pain ?-she used to work in [employer] as Conservation officer, naturecashier and in other roles as needed ? ?3) Diabetic peripheral  neuropathy ?-She tolerated Qutenza well and this provided 2 weeks of good relief, benefit started right away.  ?-Her peripheral neuropathy is worse in her bilateral lower extremities, especially the dorsum and soles of both feet, including the toes.  ?-now present in her hands as well- she has been dropping objects due to her neuropathy ?-she is ready to try Qutenza again today ?-she is interested in following with a dietician ?-she is interested in medicines for her diabetes ?-she is trying to lose weight  ?-needs handicap placard ? ?4) Right shoulder and arm pain ?-notes limited range of motion in her shoulder ?-does have pain radiating into right arm from neck and arm feels heavy at times ? ?5) Inflammatory gastritis ?--patient says she has recently been diagnosed with this condition and pain has been severe. ?-she has her gastroenterologist recommended that she discuss with us increasing her Percocet to better control her pain ?-she is currently prescribed 3 Percocet per day but feels she would benefit from up to 5 per day ? ? ? ?Pain Inventory ?Average Pain 9 ?Pain Right Now 9 ?My pain is sharp, burning, tingling and aching ? ?In the last 24 hours, has pain interfered with the following? ?General activity 0 ?Relation with others 0 ?Enjoyment of life 5 ?What TIME of day is your pain at its worst? morning , daytime, evening and night ?Sleep (in general) Poor ? ?Pain is worse with: walking, bending, sitting, inactivity, standing and some activites ?Pain improves with: heat/ice and medication ?Relief from Meds:  10 ? ?Family History  ?Problem Relation Age of Onset  ? Diabetes Mother   ? Hypertension Mother   ? Migraines Paternal Grandfather   ? Colon cancer Neg Hx   ? Esophageal cancer Neg Hx   ? Stomach cancer Neg Hx   ? Rectal cancer Neg Hx   ? ?Social History  ? ?Socioeconomic History  ? Marital status: Legally Separated  ?  Spouse name: Not on file  ? Number of children: 3  ? Years of education: Not on file  ?  Highest education level: Not on file  ?Occupational History  ? Occupation: unemployed  ?Tobacco Use  ? Smoking status: Some Days  ?  Packs/day: 0.25  ?  Years: 26.00  ?  Pack years: 6.50  ?  Types: Cigarettes  ?  Last attempt to quit: 09/13/2020  ?  Years since quitting: 1.3  ? Smokeless tobacco: Never  ? Tobacco comments:  ?  4-5 cigarettes/day  ?Vaping Use  ? Vaping Use: Never used  ?Substance and Sexual Activity  ? Alcohol use: No  ? Drug use: No  ? Sexual activity: Not Currently  ?  Birth control/protection: None  ?Other Topics Concern  ? Not on file  ?Social History Narrative  ? Right Handed  ? Lives in a one story apartment, but lives on the second floor  ? Drinks caffeine once in awhile  ? ?Social Determinants of Health  ? ?Financial Resource Strain: Not on file  ?Food Insecurity: Food Insecurity Present  ? Worried About Programme researcher, broadcasting/film/video in the Last Year: Never true  ? Ran Out of Food in the Last Year: Sometimes true  ?Transportation Needs: No Transportation Needs  ? Lack of Transportation (Medical): No  ? Lack of Transportation (Non-Medical): No  ?Physical Activity: Not on file  ?Stress: Not on file  ?Social Connections: Not on file  ? ?Past Surgical History:  ?Procedure Laterality Date  ? CESAREAN SECTION    ? x 1. for twins  ? DILATION AND CURETTAGE OF UTERUS N/A 08/20/2019  ? Procedure: DILATATION AND CURETTAGE;  Surgeon: Allie Bossier, MD;  Location: [facility] OR;  Service: Gynecology;  Laterality: N/A;  ? ENDOMETRIAL ABLATION N/A 08/20/2019  ? Procedure: Minerva Ablation;  Surgeon: Allie Bossier, MD;  Location: [facility] OR;  Service: Gynecology;  Laterality: N/A;  ? EYE SURGERY Bilateral   ? laser right and cataract removed left eye  ? LEFT HEART CATH AND CORONARY ANGIOGRAPHY N/A 08/30/2021  ? Procedure: LEFT HEART CATH AND CORONARY ANGIOGRAPHY;  Surgeon: Elder Negus, MD;  Location: [facility] IN[facility];  Service: Cardiovascular;  Laterality: N/A;  ? RADIOLOGY WITH ANESTHESIA N/A 09/16/2019  ? Procedure: MRI  WITH ANESTHESIA   L SPINE WITHOUT CONTRAST, T SPINE WITHOUT CONTRAST , CERVICAL WITHOUT CONTRAST;  Surgeon: Radiologist, Medication, MD;  Location: [facility] OR;  Service: Radiology;  Laterality: N/A;  ? TUBAL LIGATION    ? interval BTL  ? UPPER GI ENDOSCOPY  07/2017  ? ?Past Surgical History:  ?Procedure Laterality Date  ? CESAREAN SECTION    ? x 1. for twins  ? DILATION AND CURETTAGE OF UTERUS N/A 08/20/2019  ? Procedure: DILATATION AND CURETTAGE;  Surgeon: Allie Bossier, MD;  Location: [facility] OR;  Service: Gynecology;  Laterality: N/A;  ? ENDOMETRIAL ABLATION N/A 08/20/2019  ? Procedure: Minerva Ablation;  Surgeon: Allie Bossier, MD;  Location: [facility] OR;  Service: Gynecology;  Laterality: N/A;  ? EYE SURGERY Bilateral   ?  laser right and cataract removed left eye  ? LEFT HEART CATH AND CORONARY ANGIOGRAPHY N/A 08/30/2021  ? Procedure: LEFT HEART CATH AND CORONARY ANGIOGRAPHY;  Surgeon: Elder Negus, MD;  Location: [facility];  Service: Cardiovascular;  Laterality: N/A;  ? RADIOLOGY WITH ANESTHESIA N/A 09/16/2019  ? Procedure: MRI WITH ANESTHESIA   L SPINE WITHOUT CONTRAST, T SPINE WITHOUT CONTRAST , CERVICAL WITHOUT CONTRAST;  Surgeon: Radiologist, Medication, MD;  Location: [facility] OR;  Service: Radiology;  Laterality: N/A;  ? TUBAL LIGATION    ? interval BTL  ? UPPER GI ENDOSCOPY  07/2017  ? ?Past Medical History:  ?Diagnosis Date  ? Anemia   ? Anxiety   ? Arthritis   ? knees, hands  ? Asthma   ? Chronic diastolic (congestive) heart failure (HCC)   ? COPD (chronic obstructive pulmonary disease) (HCC)   ? Diabetes mellitus without complication (HCC)   ? type 2  ? DKA (diabetic ketoacidosis) (HCC) 10/15/2020  ? Dysfunctional uterine bleeding   ? Gastritis   ? GERD (gastroesophageal reflux disease)   ? Hypertension   ? Neuromuscular disorder (HCC)   ? neuropathy feet  ? Seizures (HCC) 09/12/2017  ? pt states r/t stress and blood sugar - no meds last one 4 months ago, not seen neurologist  ? Sickle cell trait (HCC)   ?  Smoker   ? Vitamin D deficiency 10/2019  ? Wears glasses   ? ?LMP 01/03/2022  ? ?Opioid Risk Score:   ?Fall Risk Score:  `1 ? ?Depression screen PHQ 2/9 ? ? ?  01/03/2022  ? 10:52 AM 12/08/2021  ? 10:50 AM 11/11/2021  ?  2

## 2022-01-24 NOTE — Telephone Encounter (Signed)
Attempted to call pt but unable to reach. Left message for her to return call. Due to multiple attempts trying to reach pt without able to do so, per protocol encounter will be closed. ?

## 2022-01-26 ENCOUNTER — Ambulatory Visit (HOSPITAL_COMMUNITY): Payer: Medicaid Other

## 2022-01-26 ENCOUNTER — Ambulatory Visit: Admit: 2022-01-26 | Payer: Medicaid Other

## 2022-01-26 ENCOUNTER — Encounter (HOSPITAL_COMMUNITY): Payer: Self-pay

## 2022-01-26 ENCOUNTER — Other Ambulatory Visit: Payer: Self-pay | Admitting: Obstetrics and Gynecology

## 2022-01-26 DIAGNOSIS — N939 Abnormal uterine and vaginal bleeding, unspecified: Secondary | ICD-10-CM

## 2022-01-26 SURGERY — MRI WITH ANESTHESIA
Anesthesia: General

## 2022-01-31 ENCOUNTER — Telehealth: Payer: Self-pay | Admitting: *Deleted

## 2022-01-31 NOTE — Telephone Encounter (Signed)
Jill Shaw left a message today she is having another problem with her medication- supposed to be covered for a year but having a situation. Request we call her or her pharmacy -[facility] re: [address]Marland Kitchen ?Jill Shaw ?

## 2022-02-01 ENCOUNTER — Telehealth: Payer: Self-pay | Admitting: Registered Nurse

## 2022-02-01 MED ORDER — OXYCODONE-ACETAMINOPHEN 10-325 MG PO TABS: 1.0000 | ORAL_TABLET | Freq: Every day | ORAL | 0 refills | Status: DC | PRN

## 2022-02-01 NOTE — Telephone Encounter (Signed)
Left message that I am returning her call to please give [place] a call if she continues to have questions/concerns. ? ?Laporchia Nakajima,RN  ?02/01/22 ?

## 2022-02-01 NOTE — Telephone Encounter (Signed)
Dr Carlis Abbott note was reviewed. ?Hydrocodone e-scribed today, Message will be sent to Ms. Jentz. Regarding the above.  ?

## 2022-02-02 ENCOUNTER — Other Ambulatory Visit: Payer: Self-pay | Admitting: Pulmonary Disease

## 2022-02-06 ENCOUNTER — Encounter: Payer: Self-pay | Admitting: Obstetrics and Gynecology

## 2022-02-06 ENCOUNTER — Encounter: Payer: Medicaid Other | Admitting: Registered Nurse

## 2022-02-06 NOTE — Telephone Encounter (Signed)
Refills were went for this patient at last visit and dosage was increased. Thanks. ?

## 2022-02-07 ENCOUNTER — Other Ambulatory Visit: Payer: Self-pay | Admitting: Cardiovascular Disease

## 2022-02-08 ENCOUNTER — Ambulatory Visit: Payer: Self-pay | Admitting: Nurse Practitioner

## 2022-02-08 ENCOUNTER — Telehealth: Payer: Self-pay | Admitting: General Practice

## 2022-02-08 NOTE — Telephone Encounter (Signed)
Patient called and left message on nurse voicemail line stating she is having an issue getting her norethindrone Rx. She states as a result she has started to bleed again and will therefore need to take more pills. Per Dr Ilda Basset, can refill aygestin 10mg  BID PRN #60 with 1 refill. Sent to pharmacy.  ? ?Called patient, no answer- unable to leave message as voicemail was full. Will send mychart message. ? ? ?

## 2022-02-16 ENCOUNTER — Telehealth: Payer: Self-pay

## 2022-02-16 NOTE — Telephone Encounter (Signed)
Insulin Lispro PRT MIX 75/25 kwkpen humalog mix 75/25 kwikpen inj 54ml inject 110 units under the skin ?

## 2022-02-18 ENCOUNTER — Other Ambulatory Visit: Payer: Self-pay | Admitting: Internal Medicine

## 2022-02-22 ENCOUNTER — Other Ambulatory Visit: Payer: Self-pay | Admitting: Nurse Practitioner

## 2022-02-22 NOTE — Telephone Encounter (Signed)
It looks like a specialist is prescribing this. It was not prescribed by [name]. I have down that her lantus is 110 units, but do not have a dosage down for the 75/35. Thanks.  ?

## 2022-02-22 NOTE — Telephone Encounter (Signed)
Ok thanks, I called and sent a message to the patient in [place] about it as well. ?

## 2022-02-23 ENCOUNTER — Ambulatory Visit: Payer: Medicaid Other | Admitting: Family Medicine

## 2022-02-24 ENCOUNTER — Ambulatory Visit: Payer: Medicaid Other | Admitting: Registered"

## 2022-02-28 ENCOUNTER — Other Ambulatory Visit: Payer: Self-pay

## 2022-02-28 ENCOUNTER — Emergency Department (HOSPITAL_COMMUNITY)
Admission: EM | Admit: 2022-02-28 | Discharge: 2022-02-28 | Disposition: A | Discharge status: Home / Self Care | Payer: Medicaid Other | Attending: Emergency Medicine | Admitting: Emergency Medicine

## 2022-02-28 ENCOUNTER — Emergency Department (HOSPITAL_COMMUNITY): Payer: Medicaid Other

## 2022-02-28 ENCOUNTER — Encounter (HOSPITAL_COMMUNITY): Payer: Self-pay

## 2022-02-28 DIAGNOSIS — Z794 Long term (current) use of insulin: Secondary | ICD-10-CM | POA: Insufficient documentation

## 2022-02-28 DIAGNOSIS — W19XXXA Unspecified fall, initial encounter: Secondary | ICD-10-CM | POA: Diagnosis not present

## 2022-02-28 DIAGNOSIS — R569 Unspecified convulsions: Secondary | ICD-10-CM

## 2022-02-28 DIAGNOSIS — R000 Tachycardia, unspecified: Secondary | ICD-10-CM | POA: Diagnosis not present

## 2022-02-28 DIAGNOSIS — R4182 Altered mental status, unspecified: Secondary | ICD-10-CM | POA: Diagnosis not present

## 2022-02-28 DIAGNOSIS — R4781 Slurred speech: Secondary | ICD-10-CM | POA: Insufficient documentation

## 2022-02-28 DIAGNOSIS — R531 Weakness: Secondary | ICD-10-CM | POA: Diagnosis not present

## 2022-02-28 DIAGNOSIS — E162 Hypoglycemia, unspecified: Secondary | ICD-10-CM

## 2022-02-28 DIAGNOSIS — R29818 Other symptoms and signs involving the nervous system: Secondary | ICD-10-CM

## 2022-02-28 DIAGNOSIS — I10 Essential (primary) hypertension: Secondary | ICD-10-CM

## 2022-02-28 DIAGNOSIS — R29810 Facial weakness: Secondary | ICD-10-CM | POA: Diagnosis not present

## 2022-02-28 DIAGNOSIS — R200 Anesthesia of skin: Secondary | ICD-10-CM | POA: Diagnosis present

## 2022-02-28 LAB — I-STAT CHEM 8, ED
BUN: 8 mg/dL (ref 6–20)
Calcium, Ion: 1.06 mmol/L — ABNORMAL LOW (ref 1.15–1.40)
Chloride: 113 mmol/L — ABNORMAL HIGH (ref 98–111)
Creatinine, Ser: 1.5 mg/dL — ABNORMAL HIGH (ref 0.44–1.00)
Glucose, Bld: 63 mg/dL — ABNORMAL LOW (ref 70–99)
HCT: 39 % (ref 36.0–46.0)
Hemoglobin: 13.3 g/dL (ref 12.0–15.0)
Potassium: 3.5 mmol/L (ref 3.5–5.1)
Sodium: 146 mmol/L — ABNORMAL HIGH (ref 135–145)
TCO2: 23 mmol/L (ref 22–32)

## 2022-02-28 LAB — I-STAT BETA HCG BLOOD, ED (MC, WL, AP ONLY): I-stat hCG, quantitative: <5 m[IU]/mL (ref <5)

## 2022-02-28 LAB — COMPREHENSIVE METABOLIC PANEL
ALT: 11 U/L (ref 0–44)
AST: 21 U/L (ref 15–41)
Albumin: 3.6 g/dL (ref 3.5–5.0)
Alkaline Phosphatase: 78 U/L (ref 38–126)
Anion gap: 11 (ref 5–15)
BUN: 8 mg/dL (ref 6–20)
CO2: 22 mmol/L (ref 22–32)
Calcium: 9.3 mg/dL (ref 8.9–10.3)
Chloride: 112 mmol/L — ABNORMAL HIGH (ref 98–111)
Creatinine, Ser: 1.55 mg/dL — ABNORMAL HIGH (ref 0.44–1.00)
GFR, Estimated: 42 mL/min — ABNORMAL LOW (ref >60)
Glucose, Bld: 61 mg/dL — ABNORMAL LOW (ref 70–99)
Potassium: 3.4 mmol/L — ABNORMAL LOW (ref 3.5–5.1)
Sodium: 145 mmol/L (ref 135–145)
Total Bilirubin: 0.6 mg/dL (ref 0.3–1.2)
Total Protein: 6.8 g/dL (ref 6.5–8.1)

## 2022-02-28 LAB — CBC
HCT: 37.3 % (ref 36.0–46.0)
Hemoglobin: 12.6 g/dL (ref 12.0–15.0)
MCH: 27.4 pg (ref 26.0–34.0)
MCHC: 33.8 g/dL (ref 30.0–36.0)
MCV: 81.1 fL (ref 80.0–100.0)
Platelets: 256 10*3/uL (ref 150–400)
RBC: 4.6 MIL/uL (ref 3.87–5.11)
RDW: 17 % — ABNORMAL HIGH (ref 11.5–15.5)
WBC: 12.4 10*3/uL — ABNORMAL HIGH (ref 4.0–10.5)
nRBC: 0 % (ref 0.0–0.2)

## 2022-02-28 LAB — CBG MONITORING, ED
Glucose-Capillary: 144 mg/dL — ABNORMAL HIGH (ref 70–99)
Glucose-Capillary: 63 mg/dL — ABNORMAL LOW (ref 70–99)
Glucose-Capillary: 90 mg/dL (ref 70–99)
Glucose-Capillary: 130 mg/dL — ABNORMAL HIGH (ref 70–99)

## 2022-02-28 LAB — DIFFERENTIAL
Abs Immature Granulocytes: 0.04 10*3/uL (ref 0.00–0.07)
Basophils Absolute: 0.1 10*3/uL (ref 0.0–0.1)
Basophils Relative: 1 %
Eosinophils Absolute: 0.1 10*3/uL (ref 0.0–0.5)
Eosinophils Relative: 1 %
Immature Granulocytes: 0 %
Lymphocytes Relative: 14 %
Lymphs Abs: 1.8 10*3/uL (ref 0.7–4.0)
Monocytes Absolute: 0.6 10*3/uL (ref 0.1–1.0)
Monocytes Relative: 4 %
Neutro Abs: 9.9 10*3/uL — ABNORMAL HIGH (ref 1.7–7.7)
Neutrophils Relative %: 80 %

## 2022-02-28 LAB — APTT: aPTT: 30 seconds (ref 24–36)

## 2022-02-28 LAB — PROTIME-INR
INR: 1 (ref 0.8–1.2)
Prothrombin Time: 13.3 seconds (ref 11.4–15.2)

## 2022-02-28 MED ORDER — LORAZEPAM 2 MG/ML IJ SOLN: 2.0000 mg | Freq: Once | INTRAMUSCULAR | Status: AC

## 2022-02-28 MED ORDER — DEXTROSE 50 % IV SOLN
25.0000 g | Freq: Once | INTRAVENOUS | Status: AC
  Administered 2022-02-28: 25 g via INTRAVENOUS

## 2022-02-28 MED ORDER — LORAZEPAM 2 MG/ML IJ SOLN
INTRAMUSCULAR | Status: AC
  Administered 2022-02-28: 2 mg via INTRAVENOUS
  Filled 2022-02-28: qty 1

## 2022-02-28 MED ORDER — ACETAMINOPHEN 500 MG PO TABS
1000.0000 mg | ORAL_TABLET | Freq: Once | ORAL | Status: AC
  Administered 2022-02-28: 1000 mg via ORAL
  Filled 2022-02-28: qty 2

## 2022-02-28 MED ORDER — HYDROCHLOROTHIAZIDE 12.5 MG PO TABS
12.5000 mg | ORAL_TABLET | Freq: Once | ORAL | Status: AC
  Administered 2022-02-28: 12.5 mg via ORAL
  Filled 2022-02-28: qty 1

## 2022-02-28 MED ORDER — METOPROLOL SUCCINATE ER 25 MG PO TB24
100.0000 mg | ORAL_TABLET | Freq: Once | ORAL | Status: AC
  Administered 2022-02-28: 100 mg via ORAL
  Filled 2022-02-28: qty 4

## 2022-02-28 MED ORDER — POTASSIUM CHLORIDE 20 MEQ PO PACK
40.0000 meq | PACK | Freq: Every day | ORAL | Status: DC
  Administered 2022-02-28: 40 meq via ORAL
  Filled 2022-02-28: qty 2

## 2022-02-28 MED ORDER — DEXTROSE 50 % IV SOLN
INTRAVENOUS | Status: AC
  Filled 2022-02-28: qty 50

## 2022-02-28 MED ORDER — SODIUM CHLORIDE 0.9% FLUSH: 3.0000 mL | Freq: Once | INTRAVENOUS | Status: DC

## 2022-02-28 NOTE — ED Notes (Signed)
Patient ambulated in hallway well. Pt was able to walk at a steady pace.  ?

## 2022-02-28 NOTE — Procedures (Signed)
Patient Name: Jill Shaw  ?MRN: [id number]  ?Epilepsy Attending: Lora Havens  ?Referring Physician/Provider: Kerney Elbe, MD ?Date:  02/28/2022 ?Duration: 25.38 mins ? ?Patient history: 46 year old female with acute onset of left facial twitching, left facial droop and left sided numbness in the context of hypoglycemia.  EEG to evaluate for seizure ? ?Level of alertness: Awake ? ?AEDs during EEG study: None ? ?Technical aspects: This EEG study was done with scalp electrodes positioned according to the 10-20 International system of electrode placement. Electrical activity was acquired at a sampling rate of 500Hz  and reviewed with a high frequency filter of 70Hz  and a low frequency filter of 1Hz . EEG data were recorded continuously and digitally stored.  ? ?Description: The posterior dominant rhythm consists of 9 Hz activity of moderate voltage (25-35 uV) seen predominantly in posterior head regions, symmetric and reactive to eye opening and eye closing.  Hyperventilation and photic stimulation were not performed.    ? ?Of note, a flutter and EKG artifact was seen throughout the study. ? ?IMPRESSION: ?This study is within normal limits. No seizures or epileptiform discharges were seen throughout the recording. ? ?Lora Havens  ? ? ?

## 2022-02-28 NOTE — ED Notes (Signed)
Patient reports her glucose dropped this morning in the 40's and was given sweets but fell when her glucose was low and hit her head.  ?

## 2022-02-28 NOTE — ED Notes (Signed)
Code Stroke cancelled.  

## 2022-02-28 NOTE — Progress Notes (Signed)
EEG complete - results pending 

## 2022-02-28 NOTE — Discharge Instructions (Addendum)
It was our pleasure to provide your ER care today - we hope that you feel better. ? ?Make sure to eat meals regularly, and not skip or delay meals when taking your diabetes medications.  Check blood sugar 4x/day and record the values. If you begin to feel your sugar may be getting low, eat or drink something right away and check sugar.  ? ?Your blood pressure is also high today - limit salt intake, take your meds as prescribed, and follow up with your doctor for recheck in the next 2-3  days - call office to arrange follow up.  ? ?Return to ER if worse, new symptoms, fevers, chest pain, trouble breathing, one-sided numbness/weakness, change in speech or vision, or other concern.  ?

## 2022-02-28 NOTE — Code Documentation (Signed)
Stroke Response Nurse Documentation ?Code Documentation ? ?Jill Shaw is a 46 y.o. female arriving to [facility]  via [facility] on 02/28/22 with past medical hx of HTN, DM2, COPD. On No antithrombotic. Code stroke was activated by EMS.  ? ?Patient from home where she was LKW at 2000 on 02/27/22. According to a friend at the bedside, this morning she was confused and acting "out of her mind", she fell and blood sugar was 49 on EMS arrival. Once in ED room it was noticed patient had a right facial droop and decreased sensation and Code Stroke was called. NIHSS 3, see documentation for details and code stroke times. Patient with right facial droop, left decreased sensation, and dysarthria  on exam. During exam patients CBG 63 so Dextrose administered, upon recheck it was 144. Patient also experiencing strong left facial twitching and dysarthria, 2mg  Ativan administered per MD. The following imaging was completed:  CT Head. Patient is not a candidate for IV Thrombolytic due to suspected seizure vs stroke per MD. Patient is not a candidate for IR due to no LVO suspected.  ? ?Care Plan: Code stroke cancelled per MD, STAT EEG ordered. ? ?Bedside handoff with ED RN .   ? ?Aundra Millet  ?Stroke Response RN ? ? ?

## 2022-02-28 NOTE — ED Triage Notes (Signed)
Patient BIB [facility] with complaint of intermittent facial twitching, facial droop, and slurred speech, LKW 2000 yesterday. Patient also complains of generalized weakness. EMS reports symptoms desist when patient is not being observed. Patient is alert, oriented, and in no apparent distress at this time. ?

## 2022-02-28 NOTE — Consult Note (Addendum)
?                    NEURO HOSPITALIST CONSULT NOTE  ? ?Requestig physician: Dr. Ashok Cordia ? ?Reason for Consult: Acute onset of left facial twitching, left facial droop and left sided numbness in the context of hypoglycemia ? ?History obtained from:  Patient and Chart    ? ?HPI:                                                                                                                                         ? Jill Shaw is an 46 y.o. female with a PMHx of arthritis, asthma, CHF, COPD, DM2, HTN, neuropathy, prior seizures secondary to hypoglycemia, sickle cell trait, and vitamin D deficiency who presented via [facility] with complaint of intermittent facial twitching, facial droop, and slurred speech. LKN was 2000 yesterday. She also complained of generalized weakness. On arrival to the ED, the patient was alert, oriented, and in no apparent distress. She reported that her glucose dropped this morning to the 40's; she was given sweets but fell when her glucose was low and hit her head. Code Stroke was then called for left facial droop and left facial twitching noted by RN.  ? ?She was last seen inpatient by our service in May of 2022, when she presented with AMS and autonomic changes after probable overuse of a recently prescribed SSRI. ? ?She has been seen previously by Dr. Narda Amber at [facility] for assessment of her neuropathy.  ? ?Past Medical History:  ?Diagnosis Date  ? Anemia   ? Anxiety   ? Arthritis   ? knees, hands  ? Asthma   ? Chronic diastolic (congestive) heart failure (HCC)   ? COPD (chronic obstructive pulmonary disease) ([place])   ? Diabetes mellitus without complication ([place])   ? type 2  ? DKA (diabetic ketoacidosis) ([place]) 10/15/2020  ? Dysfunctional uterine bleeding   ? Gastritis   ? GERD (gastroesophageal reflux disease)   ? Hypertension   ? Neuromuscular disorder ([place])   ? neuropathy feet  ? Seizures ([place]) 09/12/2017  ? pt states r/t stress and blood sugar - no meds last one 4 months  ago, not seen neurologist  ? Sickle cell trait ([place])   ? Smoker   ? Vitamin D deficiency 10/2019  ? Wears glasses   ? ? ?Past Surgical History:  ?Procedure Laterality Date  ? CESAREAN SECTION    ? x 1. for twins  ? DILATION AND CURETTAGE OF UTERUS N/A 08/20/2019  ? Procedure: DILATATION AND CURETTAGE;  Surgeon: Emily Filbert, MD;  Location: [place];  Service: Gynecology;  Laterality: N/A;  ? ENDOMETRIAL ABLATION N/A 08/20/2019  ? Procedure: Minerva Ablation;  Surgeon: Emily Filbert, MD;  Location: [place];  Service: Gynecology;  Laterality: N/A;  ? EYE SURGERY Bilateral   ? laser right and cataract removed  left eye  ? LEFT HEART CATH AND CORONARY ANGIOGRAPHY N/A 08/30/2021  ? Procedure: LEFT HEART CATH AND CORONARY ANGIOGRAPHY;  Surgeon: Nigel Mormon, MD;  Location: [facility] CV LAB;  Service: Cardiovascular;  Laterality: N/A;  ? RADIOLOGY WITH ANESTHESIA N/A 09/16/2019  ? Procedure: MRI WITH ANESTHESIA   L SPINE WITHOUT CONTRAST, T SPINE WITHOUT CONTRAST , CERVICAL WITHOUT CONTRAST;  Surgeon: Radiologist, Medication, MD;  Location: [place];  Service: Radiology;  Laterality: N/A;  ? TUBAL LIGATION    ? interval BTL  ? UPPER GI ENDOSCOPY  07/2017  ? ? ?Family History  ?Problem Relation Age of Onset  ? Diabetes Mother   ? Hypertension Mother   ? Migraines Paternal Grandfather   ? Colon cancer Neg Hx   ? Esophageal cancer Neg Hx   ? Stomach cancer Neg Hx   ? Rectal cancer Neg Hx   ?          ? ?Social History:  reports that she has been smoking cigarettes. She has a 6.50 pack-year smoking history. She has never used smokeless tobacco. She reports that she does not drink alcohol and does not use drugs. ? ?Allergies  ?Allergen Reactions  ? Amitriptyline Other (See Comments)  ?  Coma  ? Ketoprofen Nausea And Vomiting  ? Trazodone Nausea And Vomiting  ? Aspirin Nausea Only  ? Gabapentin Nausea And Vomiting and Other (See Comments)  ?  upset stomach  ? Ibuprofen Nausea And Vomiting  ? Liraglutide Nausea And Vomiting  ?  Naproxen Nausea And Vomiting  ? Omeprazole-Sodium Bicarbonate Nausea And Vomiting  ? Sulfa Antibiotics Nausea And Vomiting  ? Tramadol Nausea And Vomiting and Other (See Comments)  ?  stomach upset  ? ? ?HOME MEDICATIONS:                                                                                                                     ? ?No current facility-administered medications on file prior to encounter.  ? ?Current Outpatient Medications on File Prior to Encounter  ?Medication Sig Dispense Refill  ? Accu-Chek Softclix Lancets lancets USE TO TEST AS DIRECTED UP TO FOUR TIMES DAILY. 100 each 0  ? albuterol (PROVENTIL) (2.5 MG/3ML) 0.083% nebulizer solution Take 3 mLs (2.5 mg total) by nebulization every 6 (six) hours as needed for wheezing or shortness of breath. 150 mL 6  ? albuterol (VENTOLIN HFA) 108 (90 Base) MCG/ACT inhaler Inhale 2 puffs into the lungs every 6 (six) hours as needed for wheezing or shortness of breath. 8 g 6  ? blood glucose meter kit and supplies KIT 1 each by Other route See admin instructions. Dispense based on patient and insurance preference. Use up to four times daily as directed. (FOR ICD-9 250.00, 250.01). 1 each 2  ? Blood Pressure Monitoring (ADULT BLOOD PRESSURE CUFF LG) KIT 1 kit by Does not apply route daily. 1 kit 0  ? budesonide-formoterol (SYMBICORT) 160-4.5 MCG/ACT inhaler INHALE 2 PUFFS INTO THE LUNGS 2 (TWO)  TIMES DAILY. (Patient taking differently: Inhale 2 puffs into the lungs 2 (two) times daily.) 10.2 g 6  ? cephALEXin (KEFLEX) 500 MG capsule Take 1 capsule (500 mg total) by mouth 4 (four) times daily. (Patient not taking: Reported on 02/28/2022) 28 capsule 0  ? cetirizine (ZYRTEC) 10 MG tablet Take 1 tablet (10 mg total) by mouth daily. 30 tablet 3  ? Continuous Blood Gluc Sensor (DEXCOM G6 SENSOR) MISC 1 Device by Does not apply route as directed. 9 each 3  ? Continuous Blood Gluc Transmit (DEXCOM G6 TRANSMITTER) MISC 1 Device by Does not apply route as directed. 1  each 3  ? Dexlansoprazole (DEXILANT) 30 MG capsule DR Take 1 capsule (30 mg total) by mouth daily. 30 capsule 11  ? dicyclomine (BENTYL) 20 MG tablet Take 1 tablet (20 mg total) by mouth 2 (two) times daily. 20 tablet 0  ? empagliflozin (JARDIANCE) 25 MG TABS tablet Take 1 tablet (25 mg total) by mouth daily before breakfast. 90 tablet 3  ? escitalopram (LEXAPRO) 10 MG tablet Take 1 tablet (10 mg total) by mouth daily. 30 tablet 0  ? fluticasone (FLONASE) 50 MCG/ACT nasal spray Place 2 sprays into both nostrils daily. 16 g 6  ? furosemide (LASIX) 40 MG tablet TAKE 1 TABLET(40 MG) BY MOUTH TWICE DAILY (Patient taking differently: Take 40 mg by mouth 2 (two) times daily.) 180 tablet 1  ? Glucosamine Sulfate 1000 MG CAPS Take 1 capsule (1,000 mg total) by mouth 2 (two) times daily. (Patient taking differently: Take 1,000 mg by mouth 2 (two) times daily.) 180 capsule 3  ? glucose blood (ACCU-CHEK GUIDE) test strip USE AS DIRECTED UP TO FOUR TIMES DAILY 100 strip 4  ? haloperidol (HALDOL) 5 MG tablet Take 0.5-1 tablets (2.5-5 mg total) by mouth every 8 (eight) hours as needed (For nausea/vomiting). 15 tablet 0  ? Heating Pads (HEATING PAD MOIST/DRY KING SZ) PADS 1 each by Does not apply route 4 (four) times daily as needed. 120 each 1  ? HUMALOG KWIKPEN 200 UNIT/ML KwikPen Inject 180 Units into the skin daily.    ? hydrochlorothiazide (MICROZIDE) 12.5 MG capsule TAKE 1 CAPSULE(12.5 MG) BY MOUTH DAILY (Patient taking differently: Take 12.5 mg by mouth daily.) 90 capsule 3  ? hydrocortisone 2.5 % cream Apply topically 2 (two) times daily. (Patient taking differently: Apply 1 application. topically 2 (two) times daily.) 30 g 11  ? hydroquinone 4 % cream APPLY TOPICALLY TWICE DAILY (Patient taking differently: Apply 1 application. topically 2 (two) times daily as needed (pain).) 28.35 g 0  ? hydrOXYzine (ATARAX) 10 MG tablet Take 10 mg by mouth at bedtime.    ? hydrOXYzine (VISTARIL) 50 MG capsule Take 1 capsule (50 mg  total) by mouth 3 (three) times daily. 90 capsule 5  ? insulin glargine (LANTUS SOLOSTAR) 100 UNIT/ML Solostar Pen Inject 110 Units into the skin daily. 105 mL 3  ? insulin lispro protamine-lispro (HUMALOG 75/2

## 2022-02-28 NOTE — ED Provider Notes (Signed)
?[facility] ?Provider Note ? ? ?CSN: [id number] ?Arrival date & time: 02/28/22  7782 ? ?An emergency department physician performed an initial assessment on this suspected stroke patient at 43. ? ?History ? ?Chief Complaint  ?Patient presents with  ? Weakness  ? ? ?Jill Shaw is a 46 y.o. female. ? ?Pt indicates hx 'tias' and c/o left facial numbness and slurred speech onset this AM. Last at baseline last pm around 10 pm.  Pts parent notes pt also was disoriented this AM. Pt notes blood sugar in 40s earlier, but was given something to eat/drink - on arrival glucose in 60s. Pt denies headache. No chest pain or sob. No abd pain or nv. No fever or chills. Pt limited historian - level 5 caveat. As RN indicates symptoms had improved earlier and then worsening on arrival with ?facial droop and slurred speech, a code stroke was activated on pts arrival.  ? ?The history is provided by the patient, medical records, the EMS personnel and a relative. The history is limited by the condition of the patient.  ?Weakness ?Associated symptoms: no abdominal pain, no chest pain, no cough, no dysuria, no fever, no headaches, no shortness of breath and no vomiting   ? ?  ? ?Home Medications ?Prior to Admission medications   ?Medication Sig Start Date End Date Taking? Authorizing Provider  ?acetaminophen (TYLENOL) 500 MG tablet Take 1,000 mg by mouth every 6 (six) hours as needed.   Yes [provider]  ?albuterol (PROVENTIL) (2.5 MG/3ML) 0.083% nebulizer solution Take 3 mLs (2.5 mg total) by nebulization every 6 (six) hours as needed for wheezing or shortness of breath. 07/18/21  Yes Margaretha Seeds, MD  ?albuterol (VENTOLIN HFA) 108 (90 Base) MCG/ACT inhaler Inhale 2 puffs into the lungs every 6 (six) hours as needed for wheezing or shortness of breath. 07/18/21  Yes Margaretha Seeds, MD  ?budesonide-formoterol (SYMBICORT) 160-4.5 MCG/ACT inhaler INHALE 2 PUFFS INTO THE LUNGS 2  (TWO) TIMES DAILY. ?Patient taking differently: Inhale 2 puffs into the lungs 2 (two) times daily. 07/18/21  Yes Margaretha Seeds, MD  ?cetirizine (ZYRTEC) 10 MG tablet Take 1 tablet (10 mg total) by mouth daily. 06/07/21  Yes Vevelyn Francois, NP  ?Dexlansoprazole (DEXILANT) 30 MG capsule DR Take 1 capsule (30 mg total) by mouth daily. 11/11/21 11/11/22 Yes Pyrtle, Lajuan Lines, MD  ?empagliflozin (JARDIANCE) 25 MG TABS tablet Take 1 tablet (25 mg total) by mouth daily before breakfast. 06/27/21  Yes Shamleffer, Melanie Crazier, MD  ?escitalopram (LEXAPRO) 10 MG tablet Take 1 tablet (10 mg total) by mouth daily. 01/11/22 04/22/22 Yes Fenton Foy, NP  ?Fluocinolone Acetonide Scalp (DERMA-SMOOTHE/FS SCALP) 0.01 % OIL Apply 1 application. topically daily as needed (eczema).   Yes [provider]  ?fluticasone (FLONASE) 50 MCG/ACT nasal spray Place 2 sprays into both nostrils daily. 12/02/20  Yes Vevelyn Francois, NP  ?HUMALOG KWIKPEN 200 UNIT/ML KwikPen Inject 180 Units into the skin daily. 02/15/22  Yes [provider]  ?hydrochlorothiazide (MICROZIDE) 12.5 MG capsule TAKE 1 CAPSULE(12.5 MG) BY MOUTH DAILY ?Patient taking differently: Take 12.5 mg by mouth daily. 12/14/21  Yes Cantwell, Celeste C, PA-C  ?hydrocortisone 2.5 % cream Apply topically 2 (two) times daily. ?Patient taking differently: Apply 1 application. topically 2 (two) times daily. 09/22/20  Yes Vevelyn Francois, NP  ?hydroquinone 4 % cream APPLY TOPICALLY TWICE DAILY ?Patient taking differently: Apply 1 application. topically 2 (two) times daily as needed (  pain). 09/13/20  Yes Vevelyn Francois, NP  ?isosorbide mononitrate (IMDUR) 30 MG 24 hr tablet Take 1 tablet (30 mg total) by mouth daily. 07/07/21 04/22/22 Yes Patwardhan, Manish J, MD  ?losartan (COZAAR) 100 MG tablet TAKE 1 TABLET BY MOUTH EVERY DAY ?Patient taking differently: Take 100 mg by mouth daily. 10/18/21  Yes Patwardhan, Reynold Bowen, MD  ?metoCLOPramide (REGLAN) 10 MG tablet Take 1 tablet (10 mg  total) by mouth every 6 (six) hours as needed for nausea (nausea/headache). 01/20/22  Yes Margarita Mail, PA-C  ?ondansetron (ZOFRAN) 4 MG tablet Take 1 tablet (4 mg total) by mouth every 8 (eight) hours as needed for nausea or vomiting. 11/11/21  Yes Pyrtle, Lajuan Lines, MD  ?oxyCODONE-acetaminophen (PERCOCET) 10-325 MG tablet Take 1 tablet by mouth 5 (five) times daily as needed for pain. 02/01/22 02/01/23 Yes Bayard Hugger, NP  ?pantoprazole (PROTONIX) 40 MG tablet Take 1 tablet (40 mg total) by mouth daily. 01/20/22  Yes Harris, Abigail, PA-C  ?potassium chloride SA (KLOR-CON M) 20 MEQ tablet TAKE 1 TABLET(20 MEQ) BY MOUTH DAILY ?Patient taking differently: Take 20 mEq by mouth daily. 02/08/22  Yes Lorretta Harp, MD  ?rosuvastatin (CRESTOR) 20 MG tablet Take 1 tablet (20 mg total) by mouth daily. 07/07/21  Yes Patwardhan, Manish J, MD  ?saxagliptin HCl (ONGLYZA) 5 MG TABS tablet Take 1 tablet (5 mg total) by mouth daily. 12/02/20 04/22/22 Yes Vevelyn Francois, NP  ?SPIRIVA RESPIMAT 2.5 MCG/ACT AERS INHALE 2 PUFFS INTO THE LUNGS DAILY 02/03/22  Yes Icard, Leory Plowman L, DO  ?spironolactone (ALDACTONE) 50 MG tablet TAKE 1 TABLET(50 MG) BY MOUTH DAILY ?Patient taking differently: Take 50 mg by mouth daily. 12/22/20  Yes Patwardhan, Manish J, MD  ?sucralfate (CARAFATE) 1 g tablet Take 1 tablet (1 g total) by mouth 4 (four) times daily -  with meals and at bedtime. ?Patient taking differently: Take 1 g by mouth 3 (three) times daily as needed (stomach pain). 08/21/21  Yes Isla Pence, MD  ?sucralfate (CARAFATE) 1 GM/10ML suspension Take 10 mLs (1 g total) by mouth 4 (four) times daily. ?Patient taking differently: Take 1 g by mouth 4 (four) times daily as needed (stomach pain). 11/11/21  Yes Pyrtle, Lajuan Lines, MD  ?tiZANidine (ZANAFLEX) 4 MG tablet Take 1.5 tablets (6 mg total) by mouth every 6 (six) hours as needed for muscle spasms. 01/11/22 05/11/22 Yes Fenton Foy, NP  ?Accu-Chek Softclix Lancets lancets USE TO TEST AS DIRECTED  UP TO FOUR TIMES DAILY. 02/20/22   Shamleffer, Melanie Crazier, MD  ?blood glucose meter kit and supplies KIT 1 each by Other route See admin instructions. Dispense based on patient and insurance preference. Use up to four times daily as directed. (FOR ICD-9 250.00, 250.01). 09/02/20   Vevelyn Francois, NP  ?Blood Pressure Monitoring (ADULT BLOOD PRESSURE CUFF LG) KIT 1 kit by Does not apply route daily. 05/05/21   Vevelyn Francois, NP  ?cephALEXin (KEFLEX) 500 MG capsule Take 1 capsule (500 mg total) by mouth 4 (four) times daily. ?Patient not taking: Reported on 02/28/2022 01/19/22   Palumbo, April, MD  ?Continuous Blood Gluc Sensor (DEXCOM G6 SENSOR) MISC 1 Device by Does not apply route as directed. 06/27/21   Shamleffer, Melanie Crazier, MD  ?Continuous Blood Gluc Transmit (DEXCOM G6 TRANSMITTER) MISC 1 Device by Does not apply route as directed. 06/27/21   Shamleffer, Melanie Crazier, MD  ?dicyclomine (BENTYL) 20 MG tablet Take 1 tablet (20 mg total) by mouth 2 (  two) times daily. ?Patient not taking: Reported on 02/28/2022 01/20/22   Margarita Mail, PA-C  ?furosemide (LASIX) 40 MG tablet TAKE 1 TABLET(40 MG) BY MOUTH TWICE DAILY ?Patient taking differently: Take 40 mg by mouth 2 (two) times daily. 01/24/22   Patwardhan, Reynold Bowen, MD  ?Glucosamine Sulfate 1000 MG CAPS Take 1 capsule (1,000 mg total) by mouth 2 (two) times daily. ?Patient not taking: Reported on 02/28/2022 08/22/19   Hilts, Michael, MD  ?glucose blood (ACCU-CHEK GUIDE) test strip USE AS DIRECTED UP TO FOUR TIMES DAILY 07/29/21   Bo Merino I, NP  ?haloperidol (HALDOL) 5 MG tablet Take 0.5-1 tablets (2.5-5 mg total) by mouth every 8 (eight) hours as needed (For nausea/vomiting). ?Patient not taking: Reported on 02/28/2022 08/20/21   Molpus, John, MD  ?Heating Pads (HEATING PAD MOIST/DRY KING SZ) PADS 1 each by Does not apply route 4 (four) times daily as needed. 02/14/21   Raulkar, Clide Deutscher, MD  ?hydrOXYzine (VISTARIL) 50 MG capsule Take 1 capsule (50 mg  total) by mouth 3 (three) times daily. ?Patient not taking: Reported on 02/28/2022 09/30/21 03/29/22  Vevelyn Francois, NP  ?insulin glargine (LANTUS SOLOSTAR) 100 UNIT/ML Solostar Pen Inject 110 Units into the

## 2022-02-28 NOTE — ED Notes (Signed)
Patient ate food and drank soda.   ?

## 2022-02-28 NOTE — ED Notes (Signed)
Patient and family angry bc an MRI was not completed.  Advised CT did not indicate need for MRI and EEG was normal.  Mother states "this was not from her blood sugar being low I am a diabetic.". Patient dressed self and had shuffling gait but provided a wheelchair for discharge.  ?

## 2022-03-01 ENCOUNTER — Telehealth: Payer: Self-pay

## 2022-03-01 NOTE — Telephone Encounter (Signed)
Transition Care Management Follow-up Telephone Call ?Date of discharge and from where: 02/28/2022 from [facility] ?How have you been since you were released from the hospital? Patient stated that her symptoms are the same. Patient doesn't want to go back but is concerned about the drooping on one side her face. Patient was evaluated for sx and CT did not show signs of TIA/Stroke. Patient stated that she should have had an MRI and not a CT.  ?Any questions or concerns? No ? ?Items Reviewed: ?Did the pt receive and understand the discharge instructions provided? Yes  ?Medications obtained and verified? Yes  ?Other? No  ?Any new allergies since your discharge? No  ?Dietary orders reviewed? No ?Do you have support at home? Yes  ? ?Functional Questionnaire: (I = Independent and D = Dependent) ?ADLs: I ? ?Bathing/Dressing- I ? ?Meal Prep- I ? ?Eating- I ? ?Maintaining continence- I ? ?Transferring/Ambulation- I ? ?Managing Meds- I ? ? ?Follow up appointments reviewed: ? ?PCP Hospital f/u appt confirmed? Yes  Scheduled to see Jill Seller, NP on 03/08/2022 @ 1:00pm. ?Specialist Hospital f/u appt confirmed? No   ?Are transportation arrangements needed? No  ?If their condition worsens, is the pt aware to call PCP or go to the Emergency Dept.? Yes ?Was the patient provided with contact information for the PCP's office or ED? Yes ?Was to pt encouraged to call back with questions or concerns? Yes ? ?

## 2022-03-02 ENCOUNTER — Telehealth: Payer: Self-pay | Admitting: Neurology

## 2022-03-02 ENCOUNTER — Ambulatory Visit: Payer: Medicaid Other | Admitting: Obstetrics and Gynecology

## 2022-03-02 NOTE — Telephone Encounter (Signed)
Was in the hospital on 02/28/22. is at home now. Her left side of her face was tingling. It comes and goes. They  said it wasn't a seizure at the ED. Said she had slurred speech and facial droop. She said she needs to be seen sooner than her august appt with patel.

## 2022-03-03 ENCOUNTER — Other Ambulatory Visit: Payer: Self-pay | Admitting: Orthopedic Surgery

## 2022-03-03 ENCOUNTER — Encounter: Payer: Medicaid Other | Attending: Physical Medicine and Rehabilitation | Admitting: Registered Nurse

## 2022-03-03 ENCOUNTER — Encounter: Payer: Self-pay | Admitting: Registered Nurse

## 2022-03-03 VITALS — BP 156/97 | HR 102 | Ht 64.0 in | Wt 278.0 lb

## 2022-03-03 DIAGNOSIS — G514 Facial myokymia: Secondary | ICD-10-CM | POA: Diagnosis not present

## 2022-03-03 DIAGNOSIS — G8929 Other chronic pain: Secondary | ICD-10-CM | POA: Diagnosis not present

## 2022-03-03 DIAGNOSIS — M545 Low back pain, unspecified: Secondary | ICD-10-CM | POA: Insufficient documentation

## 2022-03-03 DIAGNOSIS — Z79891 Long term (current) use of opiate analgesic: Secondary | ICD-10-CM | POA: Insufficient documentation

## 2022-03-03 DIAGNOSIS — G629 Polyneuropathy, unspecified: Secondary | ICD-10-CM | POA: Insufficient documentation

## 2022-03-03 DIAGNOSIS — M170 Bilateral primary osteoarthritis of knee: Secondary | ICD-10-CM | POA: Insufficient documentation

## 2022-03-03 DIAGNOSIS — G894 Chronic pain syndrome: Secondary | ICD-10-CM | POA: Diagnosis not present

## 2022-03-03 DIAGNOSIS — Z5181 Encounter for therapeutic drug level monitoring: Secondary | ICD-10-CM | POA: Insufficient documentation

## 2022-03-03 MED ORDER — OXYCODONE-ACETAMINOPHEN 10-325 MG PO TABS: 1.0000 | ORAL_TABLET | Freq: Every day | ORAL | 0 refills | Status: DC | PRN

## 2022-03-03 NOTE — Progress Notes (Signed)
Subjective:    Patient ID: Jill Shaw, female    DOB: 1976/02/23, 46 y.o.   MRN: [id number]  HPI: Jill Shaw is a 46 y.o. female who returns for follow up appointment for chronic pain and medication refill. She states her pain is located in her lower back, bilateral lower extremities and bilateral knees. She rates her pain 9. Her current exercise regime is walking and performing stretching exercises.  Jill Shaw went to ED on 02/28/2022, for hypoglycemia and weakness. Note was reviewed.  She reports on 02/28/2022 she was in her home walking and her blood sugar dropped and she fell forward. EMS was called and she was taken to [facility], ED Note was reviewed.   While Jill Shaw was conversating with provider , facial twitching noted while she was speaking , she states this has occurred in the past. She admits the facial twitching is worse now, and has noticed increased facial twitching when she is stressed, she refuses ED evaluation. The above was discussed with Dr Carlis Abbott.  [name] will call her Neurologist to scheduled an appointment, she verbalizes understanding. She was encouraged to go to ED or Urgent Cre for evaluation and she refused again. Dr Carlis Abbott is aware of the above, no new orders received.   Jill Shaw Morphine equivalent is 75.00 MME.   Last UDS was Performed on 11/11/2021, it was consistent.      Pain Inventory Average Pain 10 Pain Right Now 9 My pain is constant, sharp, burning, dull, stabbing, tingling, and aching  In the last 24 hours, has pain interfered with the following? General activity 10 Relation with others 10 Enjoyment of life 10 What TIME of day is your pain at its worst? morning , daytime, evening, and night Sleep (in general) Poor  Pain is worse with: walking, bending, sitting, inactivity, standing, unsure, and some activites Pain improves with: medication and HEAT Relief from Meds: 10  Family History  Problem Relation Age of  Onset   Diabetes Mother    Hypertension Mother    Migraines Paternal Grandfather    Colon cancer Neg Hx    Esophageal cancer Neg Hx    Stomach cancer Neg Hx    Rectal cancer Neg Hx    Social History   Socioeconomic History   Marital status: Legally Separated    Spouse name: Not on file   Number of children: 3   Years of education: Not on file   Highest education level: Not on file  Occupational History   Occupation: unemployed  Tobacco Use   Smoking status: Some Days    Packs/day: 0.25    Years: 26.00    Pack years: 6.50    Types: Cigarettes    Last attempt to quit: 09/13/2020    Years since quitting: 1.4   Smokeless tobacco: Never   Tobacco comments:    4-5 cigarettes/day  Vaping Use   Vaping Use: Never used  Substance and Sexual Activity   Alcohol use: No   Drug use: No   Sexual activity: Not Currently    Birth control/protection: None  Other Topics Concern   Not on file  Social History Narrative   Right Handed   Lives in a one story apartment, but lives on the second floor   Drinks caffeine once in awhile   Social Determinants of Health   Financial Resource Strain: Not on file  Food Insecurity: Food Insecurity Present   Worried About Programme researcher, broadcasting/film/video in  the Last Year: Never true   Ran Out of Food in the Last Year: Sometimes true  Transportation Needs: No Transportation Needs   Lack of Transportation (Medical): No   Lack of Transportation (Non-Medical): No  Physical Activity: Not on file  Stress: Not on file  Social Connections: Not on file   Past Surgical History:  Procedure Laterality Date   CESAREAN SECTION     x 1. for twins   DILATION AND CURETTAGE OF UTERUS N/A 08/20/2019   Procedure: DILATATION AND CURETTAGE;  Surgeon: Allie Bossierove, Myra C, MD;  Location: [facility] OR;  Service: Gynecology;  Laterality: N/A;   ENDOMETRIAL ABLATION N/A 08/20/2019   Procedure: Minerva Ablation;  Surgeon: Allie Bossierove, Myra C, MD;  Location: [facility] OR;  Service: Gynecology;  Laterality:  N/A;   EYE SURGERY Bilateral    laser right and cataract removed left eye   LEFT HEART CATH AND CORONARY ANGIOGRAPHY N/A 08/30/2021   Procedure: LEFT HEART CATH AND CORONARY ANGIOGRAPHY;  Surgeon: Elder NegusPatwardhan, Manish J, MD;  Location: [facility] INVASIVE CV LAB;  Service: Cardiovascular;  Laterality: N/A;   RADIOLOGY WITH ANESTHESIA N/A 09/16/2019   Procedure: MRI WITH ANESTHESIA   L SPINE WITHOUT CONTRAST, T SPINE WITHOUT CONTRAST , CERVICAL WITHOUT CONTRAST;  Surgeon: Radiologist, Medication, MD;  Location: [facility] OR;  Service: Radiology;  Laterality: N/A;   TUBAL LIGATION     interval BTL   UPPER GI ENDOSCOPY  07/2017   Past Surgical History:  Procedure Laterality Date   CESAREAN SECTION     x 1. for twins   DILATION AND CURETTAGE OF UTERUS N/A 08/20/2019   Procedure: DILATATION AND CURETTAGE;  Surgeon: Allie Bossierove, Myra C, MD;  Location: [facility] OR;  Service: Gynecology;  Laterality: N/A;   ENDOMETRIAL ABLATION N/A 08/20/2019   Procedure: Minerva Ablation;  Surgeon: Allie Bossierove, Myra C, MD;  Location: [facility] OR;  Service: Gynecology;  Laterality: N/A;   EYE SURGERY Bilateral    laser right and cataract removed left eye   LEFT HEART CATH AND CORONARY ANGIOGRAPHY N/A 08/30/2021   Procedure: LEFT HEART CATH AND CORONARY ANGIOGRAPHY;  Surgeon: Elder NegusPatwardhan, Manish J, MD;  Location: [facility] INV[facility] CV [facility];  Service: Cardiovascular;  Laterality: N/A;   RADIOLOGY WITH ANESTHESIA N/A 09/16/2019   Procedure: MRI WITH ANESTHESIA   L SPINE WITHOUT CONTRAST, T SPINE WITHOUT CONTRAST , CERVICAL WITHOUT CONTRAST;  Surgeon: Radiologist, Medication, MD;  Location: [facility] OR;  Service: Radiology;  Laterality: N/A;   TUBAL LIGATION     interval BTL   UPPER GI ENDOSCOPY  07/2017   Past Medical History:  Diagnosis Date   Anemia    Anxiety    Arthritis    knees, hands   Asthma    Chronic diastolic (congestive) heart failure (HCC)    COPD (chronic obstructive pulmonary disease) (HCC)    Diabetes mellitus without complication (HCC)    type 2    DKA (diabetic ketoacidosis) (HCC) 10/15/2020   Dysfunctional uterine bleeding    Gastritis    GERD (gastroesophageal reflux disease)    Hypertension    Neuromuscular disorder (HCC)    neuropathy feet   Seizures (HCC) 09/12/2017   pt states r/t stress and blood sugar - no meds last one 4 months ago, not seen neurologist   Sickle cell trait (HCC)    Smoker    Vitamin D deficiency 10/2019   Wears glasses    Ht 5\' 4"  (1.626 m)   Wt 278 lb (126.1 kg)   BMI 47.72 kg/m  Opioid Risk Score:   Fall Risk Score:  `1  Depression screen [facility] 2/9     01/03/2022   10:52 AM 12/08/2021   10:50 AM 11/11/2021    2:50 PM 10/11/2021    9:57 AM 09/30/2021   11:28 AM 08/01/2021   11:28 AM 07/29/2021    9:24 AM  Depression screen PHQ 2/9  Decreased Interest 3 2 1 1  0 1 2  Down, Depressed, Hopeless 2 2 1   0 1 2  PHQ - 2 Score 5 4 2 1  0 2 4  Altered sleeping  2     3  Tired, decreased energy  2     2  Change in appetite  0     0  Trouble concentrating  2     2  Moving slowly or fidgety/restless  0     0  Suicidal thoughts  0     0  PHQ-9 Score  10     11    Review of Systems  Musculoskeletal:  Positive for back pain, gait problem, joint swelling and neck pain.  All other systems reviewed and are negative.     Objective:   Physical Exam Vitals and nursing note reviewed.  Constitutional:      Appearance: Normal appearance.  Cardiovascular:     Rate and Rhythm: Normal rate and regular rhythm.     Pulses: Normal pulses.     Heart sounds: Normal heart sounds.  Pulmonary:     Effort: Pulmonary effort is normal.     Breath sounds: Normal breath sounds.  Musculoskeletal:     Cervical back: Normal range of motion and neck supple.     Comments: Normal Muscle Bulk and Muscle Testing Reveals:  Upper Extremities: Full ROM and Muscle Strength 5/5 Lumbar Hypersensitivity Lower Extremities: Full ROM and Muscle Strength 5/5 Arises from Table with ease Narrow Based  Gait     Skin:     General: Skin is warm and dry.  Neurological:     Mental Status: She is alert and oriented to person, place, and time.  Psychiatric:        Mood and Affect: Mood normal.        Behavior: Behavior normal.         Assessment & Plan:  1. Lumbar Radiculitis: Continue current medication regimen. Continue HEP as Tolerated.03/03/2022 2. Fibromyalgia/Polyneuropathy: Continue HEP as Tolerated. Continue current Medication regimen. Continue to Monitor. 03/03/2022 3. Bilateral Knee Pain: No complaints today. Continue to Monitor.03/03/2022 4. Chronic Pain Syndrome: Continue Hydrocodone 10/325mg  one tablet 5 times  a day as needed for pain #120. We will continue the opioid monitoring program, this consists of regular clinic visits, examinations, urine drug screen, pill counts as well as use of 03/05/2022 Controlled Substance Reporting system. A 12 month History has been reviewed on the 03/05/2022 Controlled Substance Reporting System on 03/03/2022.  5. Tachycardia: Apical pulse check. Ms. Tzeng will F/U with her PCP and Cardiologist. Continue to monitor.  03/03/2022  6. Fall at Home: Educated on Falls Prevention: PCP and Ortho Following per Ms. Vanderford. She verbalizes understanding. 03/03/2022  7. Facial Twitching: She refuses Ed or Urgent Care Evaluation. She will call her neurologist to scheduled and appointment. She verbalizes understanding.   F/U in 1 month

## 2022-03-04 ENCOUNTER — Other Ambulatory Visit: Payer: Self-pay | Admitting: Internal Medicine

## 2022-03-06 ENCOUNTER — Encounter: Payer: Self-pay | Admitting: Registered Nurse

## 2022-03-06 NOTE — Telephone Encounter (Signed)
Scheduled to see Clarise Cruz 5/24.

## 2022-03-07 ENCOUNTER — Encounter: Payer: Self-pay | Admitting: Obstetrics and Gynecology

## 2022-03-07 ENCOUNTER — Ambulatory Visit (INDEPENDENT_AMBULATORY_CARE_PROVIDER_SITE_OTHER): Payer: Medicaid Other | Admitting: Obstetrics and Gynecology

## 2022-03-07 VITALS — BP 156/64 | HR 88 | Wt 287.2 lb

## 2022-03-07 DIAGNOSIS — Z3042 Encounter for surveillance of injectable contraceptive: Secondary | ICD-10-CM

## 2022-03-07 DIAGNOSIS — I10 Essential (primary) hypertension: Secondary | ICD-10-CM

## 2022-03-07 DIAGNOSIS — Z01419 Encounter for gynecological examination (general) (routine) without abnormal findings: Secondary | ICD-10-CM

## 2022-03-07 MED ORDER — MEDROXYPROGESTERONE ACETATE 150 MG/ML IM SUSP
150.0000 mg | Freq: Once | INTRAMUSCULAR | Status: AC
  Administered 2022-03-07: 150 mg via INTRAMUSCULAR

## 2022-03-07 NOTE — Progress Notes (Signed)
GYNECOLOGY ANNUAL PREVENTATIVE CARE ENCOUNTER NOTE  History:     Jill Shaw is a 46 y.o. G51P0 female here for a routine annual gynecologic exam.  Current complaints: none, desires depo provera.   Denies abnormal vaginal bleeding, discharge, pelvic pain, problems with intercourse or other gynecologic concerns.    Gynecologic History No LMP recorded. Patient has had an injection. Contraception: Depo-Provera injections Last Pap: 07/25/21. Results were: normal with negative HPV Last mammogram: 12/20/21. Results were: normal  Obstetric History OB History  Gravida Para Term Preterm AB Living  3         3  SAB IAB Ectopic Multiple Live Births          3    # Outcome Date GA Lbr Len/2nd Weight Sex Delivery Anes PTL Lv  3 Gravida           2 Gravida           1 Gravida             Obstetric Comments  Vaginal x 1 and then c-section for twins    Past Medical History:  Diagnosis Date   Anemia    Anxiety    Arthritis    knees, hands   Asthma    Chronic diastolic (congestive) heart failure (HCC)    COPD (chronic obstructive pulmonary disease) (HCC)    Diabetes mellitus without complication ([place])    type 2   DKA (diabetic ketoacidosis) ([place]) 10/15/2020   Dysfunctional uterine bleeding    Gastritis    GERD (gastroesophageal reflux disease)    Hypertension    Neuromuscular disorder ([place])    neuropathy feet   Seizures ([place]) 09/12/2017   pt states r/t stress and blood sugar - no meds last one 4 months ago, not seen neurologist   Sickle cell trait ([place])    Smoker    Vitamin D deficiency 10/2019   Wears glasses     Past Surgical History:  Procedure Laterality Date   CESAREAN SECTION     x 1. for twins   DILATION AND CURETTAGE OF UTERUS N/A 08/20/2019   Procedure: DILATATION AND CURETTAGE;  Surgeon: Emily Filbert, MD;  Location: [place];  Service: Gynecology;  Laterality: N/A;   ENDOMETRIAL ABLATION N/A 08/20/2019   Procedure: [place] Ablation;  Surgeon: Emily Filbert, MD;   Location: [place];  Service: Gynecology;  Laterality: N/A;   EYE SURGERY Bilateral    laser right and cataract removed left eye   LEFT HEART CATH AND CORONARY ANGIOGRAPHY N/A 08/30/2021   Procedure: LEFT HEART CATH AND CORONARY ANGIOGRAPHY;  Surgeon: Nigel Mormon, MD;  Location: [place] [facility];  Service: Cardiovascular;  Laterality: N/A;   RADIOLOGY WITH ANESTHESIA N/A 09/16/2019   Procedure: MRI WITH ANESTHESIA   L SPINE WITHOUT CONTRAST, T SPINE WITHOUT CONTRAST , CERVICAL WITHOUT CONTRAST;  Surgeon: Radiologist, Medication, MD;  Location: [place];  Service: Radiology;  Laterality: N/A;   TUBAL LIGATION     interval BTL   UPPER GI ENDOSCOPY  07/2017    Current Outpatient Medications on File Prior to Visit  Medication Sig Dispense Refill   Accu-Chek Softclix Lancets lancets USE TO TEST AS DIRECTED UP TO FOUR TIMES DAILY. 100 each 0   acetaminophen (TYLENOL) 500 MG tablet Take 1,000 mg by mouth every 6 (six) hours as needed.     albuterol (PROVENTIL) (2.5 MG/3ML) 0.083% nebulizer solution Take 3 mLs (2.5 mg total) by nebulization every 6 (six)  hours as needed for wheezing or shortness of breath. 150 mL 6   albuterol (VENTOLIN HFA) 108 (90 Base) MCG/ACT inhaler Inhale 2 puffs into the lungs every 6 (six) hours as needed for wheezing or shortness of breath. 8 g 6   blood glucose meter kit and supplies KIT 1 each by Other route See admin instructions. Dispense based on patient and insurance preference. Use up to four times daily as directed. (FOR ICD-9 250.00, 250.01). 1 each 2   Blood Pressure Monitoring (ADULT BLOOD PRESSURE CUFF LG) KIT 1 kit by Does not apply route daily. 1 kit 0   budesonide-formoterol (SYMBICORT) 160-4.5 MCG/ACT inhaler INHALE 2 PUFFS INTO THE LUNGS 2 (TWO) TIMES DAILY. (Patient taking differently: Inhale 2 puffs into the lungs 2 (two) times daily.) 10.2 g 6   cetirizine (ZYRTEC) 10 MG tablet Take 1 tablet (10 mg total) by mouth daily. 30 tablet 3   Continuous Blood  Gluc Sensor (DEXCOM G6 SENSOR) MISC 1 Device by Does not apply route as directed. 9 each 3   Continuous Blood Gluc Transmit (DEXCOM G6 TRANSMITTER) MISC 1 Device by Does not apply route as directed. 1 each 3   Dexlansoprazole (DEXILANT) 30 MG capsule DR Take 1 capsule (30 mg total) by mouth daily. 30 capsule 11   diclofenac Sodium (VOLTAREN) 1 % GEL APPLY 2 GRAMS TOPICALLY TO THE AFFECTED AREA FOUR TIMES DAILY 200 g 1   dicyclomine (BENTYL) 20 MG tablet Take 1 tablet (20 mg total) by mouth 2 (two) times daily. 20 tablet 0   empagliflozin (JARDIANCE) 25 MG TABS tablet Take 1 tablet (25 mg total) by mouth daily before breakfast. 90 tablet 3   escitalopram (LEXAPRO) 10 MG tablet Take 1 tablet (10 mg total) by mouth daily. 30 tablet 0   Fluocinolone Acetonide Scalp 0.01 % OIL Apply 1 application. topically daily as needed (eczema).     fluticasone (FLONASE) 50 MCG/ACT nasal spray Place 2 sprays into both nostrils daily. 16 g 6   furosemide (LASIX) 40 MG tablet TAKE 1 TABLET(40 MG) BY MOUTH TWICE DAILY (Patient taking differently: Take 40 mg by mouth 2 (two) times daily.) 180 tablet 1   Glucosamine Sulfate 1000 MG CAPS Take 1 capsule (1,000 mg total) by mouth 2 (two) times daily. 180 capsule 3   glucose blood (ACCU-CHEK GUIDE) test strip USE AS DIRECTED UP TO FOUR TIMES DAILY 100 strip 4   Heating Pads (HEATING PAD MOIST/DRY KING SZ) PADS 1 each by Does not apply route 4 (four) times daily as needed. 120 each 1   HUMALOG KWIKPEN 200 UNIT/ML KwikPen Inject 180 Units into the skin daily.     hydrochlorothiazide (MICROZIDE) 12.5 MG capsule TAKE 1 CAPSULE(12.5 MG) BY MOUTH DAILY (Patient taking differently: Take 12.5 mg by mouth daily.) 90 capsule 3   hydrocortisone 2.5 % cream Apply topically 2 (two) times daily. (Patient taking differently: Apply 1 application. topically 2 (two) times daily.) 30 g 11   hydroquinone 4 % cream APPLY TOPICALLY TWICE DAILY (Patient taking differently: Apply 1 application.  topically 2 (two) times daily as needed (pain).) 28.35 g 0   insulin glargine (LANTUS SOLOSTAR) 100 UNIT/ML Solostar Pen Inject 110 Units into the skin daily. (Patient taking differently: Inject 180 Units into the skin daily.) 105 mL 3   Insulin Pen Needle 31G X 8 MM MISC 1 Device by Does not apply route in the morning, at noon, in the evening, and at bedtime. 400 each 3   isosorbide mononitrate (  IMDUR) 30 MG 24 hr tablet Take 1 tablet (30 mg total) by mouth daily. 90 tablet 3   losartan (COZAAR) 100 MG tablet TAKE 1 TABLET BY MOUTH EVERY DAY (Patient taking differently: Take 100 mg by mouth daily.) 30 tablet 3   metoCLOPramide (REGLAN) 10 MG tablet Take 1 tablet (10 mg total) by mouth every 6 (six) hours as needed for nausea (nausea/headache). 6 tablet 0   metoprolol succinate (TOPROL-XL) 100 MG 24 hr tablet Take 100 mg by mouth daily. Take with or immediately following a meal.     ondansetron (ZOFRAN) 4 MG tablet Take 1 tablet (4 mg total) by mouth every 8 (eight) hours as needed for nausea or vomiting. 30 tablet 1   oxyCODONE-acetaminophen (PERCOCET) 10-325 MG tablet Take 1 tablet by mouth 5 (five) times daily as needed for pain. 150 tablet 0   pantoprazole (PROTONIX) 40 MG tablet Take 1 tablet (40 mg total) by mouth daily. 30 tablet 0   potassium chloride SA (KLOR-CON M) 20 MEQ tablet TAKE 1 TABLET(20 MEQ) BY MOUTH DAILY (Patient taking differently: Take 20 mEq by mouth daily.) 30 tablet 6   predniSONE (STERAPRED UNI-PAK 21 TAB) 5 MG (21) TBPK tablet Take as directed 21 tablet 0   promethazine (PHENERGAN) 25 MG suppository Place 1 suppository (25 mg total) rectally every 6 (six) hours as needed for nausea or vomiting. 6 suppository 0   rosuvastatin (CRESTOR) 20 MG tablet Take 1 tablet (20 mg total) by mouth daily. 30 tablet 3   saxagliptin HCl (ONGLYZA) 5 MG TABS tablet Take 1 tablet (5 mg total) by mouth daily. 90 tablet 3   SPIRIVA RESPIMAT 2.5 MCG/ACT AERS INHALE 2 PUFFS INTO THE LUNGS DAILY 4  g 6   spironolactone (ALDACTONE) 50 MG tablet TAKE 1 TABLET(50 MG) BY MOUTH DAILY (Patient taking differently: Take 50 mg by mouth daily.) 30 tablet 3   sucralfate (CARAFATE) 1 g tablet Take 1 tablet (1 g total) by mouth 4 (four) times daily -  with meals and at bedtime. (Patient taking differently: Take 1 g by mouth 3 (three) times daily as needed (stomach pain).) 90 tablet 0   sucralfate (CARAFATE) 1 GM/10ML suspension Take 10 mLs (1 g total) by mouth 4 (four) times daily. (Patient taking differently: Take 1 g by mouth 4 (four) times daily as needed (stomach pain).) 420 mL 1   tiZANidine (ZANAFLEX) 4 MG tablet Take 1.5 tablets (6 mg total) by mouth every 6 (six) hours as needed for muscle spasms. 120 tablet 3   haloperidol (HALDOL) 5 MG tablet Take 0.5-1 tablets (2.5-5 mg total) by mouth every 8 (eight) hours as needed (For nausea/vomiting). (Patient not taking: Reported on 02/28/2022) 15 tablet 0   hydrOXYzine (VISTARIL) 50 MG capsule Take 1 capsule (50 mg total) by mouth 3 (three) times daily. (Patient not taking: Reported on 02/28/2022) 90 capsule 5   No current facility-administered medications on file prior to visit.    Allergies  Allergen Reactions   Elavil [Amitriptyline] Other (See Comments)    Coma   Orudis [Ketoprofen] Nausea And Vomiting   Desyrel [Trazodone] Nausea And Vomiting   Aspirin Nausea Only   Motrin [Ibuprofen] Nausea And Vomiting   Naprosyn [Naproxen] Nausea And Vomiting   Neurontin [Gabapentin] Nausea And Vomiting and Other (See Comments)    upset stomach   Sulfa Antibiotics Nausea And Vomiting   Ultram [Tramadol] Nausea And Vomiting and Other (See Comments)    stomach upset   Victoza [Liraglutide] Nausea  And Vomiting   Zegerid [Omeprazole-Sodium Bicarbonate] Nausea And Vomiting    Social History:  reports that she has been smoking cigarettes. She has a 6.50 pack-year smoking history. She has never used smokeless tobacco. She reports that she does not drink alcohol  and does not use drugs.  Family History  Problem Relation Age of Onset   Diabetes Mother    Hypertension Mother    Migraines Paternal Grandfather    Colon cancer Neg Hx    Esophageal cancer Neg Hx    Stomach cancer Neg Hx    Rectal cancer Neg Hx     The following portions of the patient's history were reviewed and updated as appropriate: allergies, current medications, past family history, past medical history, past social history, past surgical history and problem list.  Review of Systems Pertinent items noted in HPI and remainder of comprehensive ROS otherwise negative.  Physical Exam:  BP (!) 156/64   Pulse 88   Wt 287 lb 3.2 oz (130.3 kg)   BMI 49.30 kg/m  CONSTITUTIONAL: Well-developed, well-nourished, obese female in no acute distress.  HENT:  Normocephalic, atraumatic, External right and left ear normal. Oropharynx is clear and moist EYES: Conjunctivae and EOM are normal.  NECK: Normal range of motion, supple, no masses.  Normal thyroid.  SKIN: Skin is warm and dry. No rash noted. Not diaphoretic. No erythema. No pallor. MUSCULOSKELETAL: Normal range of motion. No tenderness.  No cyanosis, clubbin, 1-2 + BLE edema noted NEUROLOGIC: Alert and oriented to person, place, and time. Normal reflexes, muscle tone coordination.  PSYCHIATRIC: Normal mood and affect. Normal behavior. Normal judgment and thought content. CARDIOVASCULAR: Normal heart rate noted, regular rhythm RESPIRATORY: Clear to auscultation bilaterally. Effort and breath sounds normal, no problems with respiration noted. BREASTS:deferred ABDOMEN: Soft, no distention noted.  No tenderness, rebound or guarding.  PELVIC: deferred   Assessment and Plan:    1. Depo-Provera contraceptive status Pt received depo provera injection today - medroxyPROGESTERone (DEPO-PROVERA) injection 150 mg  2. Essential hypertension BP 154/64, pt states it is monitored by her cardiologist and PCP  3. Women's annual routine  gynecological examination Normal annual exam  F/u in 1 year or PRN Routine preventative health maintenance measures emphasized. Please refer to After Visit Summary for other counseling recommendations.      Lynnda Shields, MD, [facility], [place]

## 2022-03-08 ENCOUNTER — Ambulatory Visit (INDEPENDENT_AMBULATORY_CARE_PROVIDER_SITE_OTHER): Payer: Medicaid Other | Admitting: Physician Assistant

## 2022-03-08 ENCOUNTER — Ambulatory Visit: Payer: Medicaid Other | Admitting: Nurse Practitioner

## 2022-03-08 ENCOUNTER — Encounter: Payer: Self-pay | Admitting: Physician Assistant

## 2022-03-08 VITALS — BP 179/82 | HR 99 | Resp 20 | Ht 64.0 in | Wt 287.0 lb

## 2022-03-08 DIAGNOSIS — G459 Transient cerebral ischemic attack, unspecified: Secondary | ICD-10-CM | POA: Diagnosis not present

## 2022-03-08 NOTE — Progress Notes (Signed)
[facility] Clinic Note - Initial Visit   Date: 03/08/22  Jill Shaw MRN: [id number] DOB: 02-06-1976   Dear Dr Nils Pyle, Kriste Basque, NP:  Thank you for your kind referral of Jill Shaw for consultation of TIA symptoms. Although her history is well known to you, please allow [place] to reiterate it for the purpose of our medical record. The patient was accompanied to the clinic by her mother who also provides collateral information.     History of Present Illness: Jill Shaw is a 46 y.o. R-handed female with a history of anemia, DM 2 with the latest hemoglobin A1c 8.7, B12 deficiency, chronic diastolic CHF, COPD, bilateral foot neuropathy, sickle cell trait, vitamin D deficiency, tobacco abuse, hypertension, hyperlipidemia, anxiety, presenting for evaluation of TIA symptoms.  The patient is accompanied by her mother, who supplements the history.  In review, she presented on 02/28/2022 complaining of left facial numbness and slurred speech since that morning, feeling slightly disoriented in the morning.  Of note, prior to being admitted, her blood sugar was in the 40s, and upon eating and drinking some fluids, this glucose rose to the 60s.  She did not have any headaches, chest pain or shortness of breath, abdominal pain, nausea or vomiting.  She denies any fever or chills.  The patient is a very limited historian, her history is very unclear, and tangential at times.  Upon presentation, code stroke was activated, and neurology evaluated her.  Per chart report, the patient was alert, and the speech had "mildly thick "dysarthric quality, without aphasia, and she noted to have some tingling in the left side of the face, otherwise the exam was unremarkable ".  Other than glucose being low, the rest of her work-up was essentially negative.  EKG was in sinus tachycardia, without any other abnormalities.  She received IV fluids, D50, and Ativan with good resolution of the  symptoms.  She had an EEG, which was normal, without any indication of seizures.  Blood pressure was slightly elevated.  Neurology ordered a CT of the head, which was negative, and no further testing was indicated by Dr. Cheral Marker, who deemed the patient stable to be discharged in absence of other symptoms. Denies vertigo dizziness or vision changes.  Denies any fever or chills, or night sweats.  She does smoke cigarettes.  No new meds or hormonal supplements.  The patient is not on aspirin, she states that "I cannot have it ", but cannot explain the reason.  However, she takes BC powder.  She states that she is not interested in taking any blood thinners at this time.  No sick contacts. No new stressors present in personal life.  She is not very active, does not exercise daily.  Of note, the patient reports that at times, she feels some pain in the face, but is able to chew, and swallow.  It is also unclear if she has a history of migraine headaches.  She denies unilateral weakness or numbness at this time during the visit.   Out-side paper records, electronic medical record, and images have been reviewed where available and summarized as:  Lab Results  Component Value Date   HGBA1C 8.7 (A) 12/09/2021   HGBA1C 8.7 12/09/2021   HGBA1C 8.7 (A) 12/09/2021   HGBA1C 8.7 (A) 12/09/2021   Lab Results  Component Value Date   VITAMINB12 150 (L) 01/10/2021   Lab Results  Component Value Date   TSH 2.330 07/29/2021   Lab Results  Component Value Date   ESRSEDRATE 25 (H) 03/23/2021    Past Medical History:  Diagnosis Date   Anemia    Anxiety    Arthritis    knees, hands   Asthma    Chronic diastolic (congestive) heart failure (HCC)    COPD (chronic obstructive pulmonary disease) (HCC)    Diabetes mellitus without complication ([place])    type 2   DKA (diabetic ketoacidosis) ([place]) 10/15/2020   Dysfunctional uterine bleeding    Gastritis    GERD (gastroesophageal reflux disease)    Hypertension     Neuromuscular disorder (HCC)    neuropathy feet   Seizures ([place]) 09/12/2017   pt states r/t stress and blood sugar - no meds last one 4 months ago, not seen neurologist   Sickle cell trait ([place])    Smoker    Vitamin D deficiency 10/2019   Wears glasses     Past Surgical History:  Procedure Laterality Date   CESAREAN SECTION     x 1. for twins   DILATION AND CURETTAGE OF UTERUS N/A 08/20/2019   Procedure: DILATATION AND CURETTAGE;  Surgeon: Emily Filbert, MD;  Location: [facility];  Service: Gynecology;  Laterality: N/A;   ENDOMETRIAL ABLATION N/A 08/20/2019   Procedure: [place] Ablation;  Surgeon: Emily Filbert, MD;  Location: [facility];  Service: Gynecology;  Laterality: N/A;   EYE SURGERY Bilateral    laser right and cataract removed left eye   LEFT HEART CATH AND CORONARY ANGIOGRAPHY N/A 08/30/2021   Procedure: LEFT HEART CATH AND CORONARY ANGIOGRAPHY;  Surgeon: Nigel Mormon, MD;  Location: [facility];  Service: Cardiovascular;  Laterality: N/A;   RADIOLOGY WITH ANESTHESIA N/A 09/16/2019   Procedure: MRI WITH ANESTHESIA   L SPINE WITHOUT CONTRAST, T SPINE WITHOUT CONTRAST , CERVICAL WITHOUT CONTRAST;  Surgeon: Radiologist, Medication, MD;  Location: [facility];  Service: Radiology;  Laterality: N/A;   TUBAL LIGATION     interval BTL   UPPER GI ENDOSCOPY  07/2017     Medications:  Outpatient Encounter Medications as of 03/08/2022  Medication Sig Note   Accu-Chek Softclix Lancets lancets USE TO TEST AS DIRECTED UP TO FOUR TIMES DAILY.    acetaminophen (TYLENOL) 500 MG tablet Take 1,000 mg by mouth every 6 (six) hours as needed.    albuterol (PROVENTIL) (2.5 MG/3ML) 0.083% nebulizer solution Take 3 mLs (2.5 mg total) by nebulization every 6 (six) hours as needed for wheezing or shortness of breath.    albuterol (VENTOLIN HFA) 108 (90 Base) MCG/ACT inhaler Inhale 2 puffs into the lungs every 6 (six) hours as needed for wheezing or shortness of breath.    blood glucose meter kit and  supplies KIT 1 each by Other route See admin instructions. Dispense based on patient and insurance preference. Use up to four times daily as directed. (FOR ICD-9 250.00, 250.01).    Blood Pressure Monitoring (ADULT BLOOD PRESSURE CUFF LG) KIT 1 kit by Does not apply route daily.    budesonide-formoterol (SYMBICORT) 160-4.5 MCG/ACT inhaler INHALE 2 PUFFS INTO THE LUNGS 2 (TWO) TIMES DAILY. (Patient taking differently: Inhale 2 puffs into the lungs 2 (two) times daily.)    cetirizine (ZYRTEC) 10 MG tablet Take 1 tablet (10 mg total) by mouth daily.    Continuous Blood Gluc Sensor (DEXCOM G6 SENSOR) MISC 1 Device by Does not apply route as directed.    Dexlansoprazole (DEXILANT) 30 MG capsule DR Take 1 capsule (30 mg total) by mouth daily.  diclofenac Sodium (VOLTAREN) 1 % GEL APPLY 2 GRAMS TOPICALLY TO THE AFFECTED AREA FOUR TIMES DAILY    empagliflozin (JARDIANCE) 25 MG TABS tablet Take 1 tablet (25 mg total) by mouth daily before breakfast. 02/28/2022: LF 09/30/2021 #85, 90 DS   escitalopram (LEXAPRO) 10 MG tablet Take 1 tablet (10 mg total) by mouth daily. 02/28/2022: LF 01/13/2022 #30, 30 DS   furosemide (LASIX) 40 MG tablet TAKE 1 TABLET(40 MG) BY MOUTH TWICE DAILY (Patient taking differently: Take 40 mg by mouth 2 (two) times daily.)    Glucosamine Sulfate 1000 MG CAPS Take 1 capsule (1,000 mg total) by mouth 2 (two) times daily.    glucose blood (ACCU-CHEK GUIDE) test strip USE AS DIRECTED UP TO FOUR TIMES DAILY    Heating Pads (HEATING PAD MOIST/DRY KING SZ) PADS 1 each by Does not apply route 4 (four) times daily as needed.    HUMALOG KWIKPEN 200 UNIT/ML KwikPen Inject 180 Units into the skin daily.    hydrochlorothiazide (MICROZIDE) 12.5 MG capsule TAKE 1 CAPSULE(12.5 MG) BY MOUTH DAILY (Patient taking differently: Take 12.5 mg by mouth daily.)    hydrocortisone 2.5 % cream Apply topically 2 (two) times daily. (Patient taking differently: Apply 1 application. topically 2 (two) times daily.)     hydroquinone 4 % cream APPLY TOPICALLY TWICE DAILY (Patient taking differently: Apply 1 application. topically 2 (two) times daily as needed (pain).)    hydrOXYzine (VISTARIL) 50 MG capsule Take 1 capsule (50 mg total) by mouth 3 (three) times daily. 02/28/2022: LF 12/31/2021 #90, 30 DS   insulin glargine (LANTUS SOLOSTAR) 100 UNIT/ML Solostar Pen Inject 110 Units into the skin daily. (Patient taking differently: Inject 180 Units into the skin daily.)    Insulin Pen Needle 31G X 8 MM MISC 1 Device by Does not apply route in the morning, at noon, in the evening, and at bedtime.    isosorbide mononitrate (IMDUR) 30 MG 24 hr tablet Take 1 tablet (30 mg total) by mouth daily.    losartan (COZAAR) 100 MG tablet TAKE 1 TABLET BY MOUTH EVERY DAY (Patient taking differently: Take 100 mg by mouth daily.)    metoCLOPramide (REGLAN) 10 MG tablet Take 1 tablet (10 mg total) by mouth every 6 (six) hours as needed for nausea (nausea/headache). 02/28/2022: LF 01/20/2022 #6, 1 DS   metoprolol succinate (TOPROL-XL) 100 MG 24 hr tablet Take 100 mg by mouth daily. Take with or immediately following a meal. 02/28/2022: Pt states she sometimes "forgets" to take this medication... LF 10/14/20 #90, 90 DS   ondansetron (ZOFRAN) 4 MG tablet Take 1 tablet (4 mg total) by mouth every 8 (eight) hours as needed for nausea or vomiting. 02/28/2022: LF 11/11/2021 #30, 10 DS   oxyCODONE-acetaminophen (PERCOCET) 10-325 MG tablet Take 1 tablet by mouth 5 (five) times daily as needed for pain.    pantoprazole (PROTONIX) 40 MG tablet Take 1 tablet (40 mg total) by mouth daily. 02/28/2022: LF 01/20/2022 #30, 30 DS   potassium chloride SA (KLOR-CON M) 20 MEQ tablet TAKE 1 TABLET(20 MEQ) BY MOUTH DAILY (Patient taking differently: Take 20 mEq by mouth daily.)    promethazine (PHENERGAN) 25 MG suppository Place 1 suppository (25 mg total) rectally every 6 (six) hours as needed for nausea or vomiting. 02/28/2022: LF 01/20/2022 #6, 1 DS    rosuvastatin (CRESTOR) 20 MG tablet Take 1 tablet (20 mg total) by mouth daily. 02/28/2022: LF 08/09/2021 #30, 30 DS   saxagliptin HCl (ONGLYZA) 5 MG TABS tablet  Take 1 tablet (5 mg total) by mouth daily. 02/28/2022: LF 12/02/2020 #90, 90 DS   SPIRIVA RESPIMAT 2.5 MCG/ACT AERS INHALE 2 PUFFS INTO THE LUNGS DAILY    spironolactone (ALDACTONE) 50 MG tablet TAKE 1 TABLET(50 MG) BY MOUTH DAILY (Patient taking differently: Take 50 mg by mouth daily.) 02/28/2022: LF 08/16/2021 #30, 30 DS   sucralfate (CARAFATE) 1 g tablet Take 1 tablet (1 g total) by mouth 4 (four) times daily -  with meals and at bedtime. (Patient taking differently: Take 1 g by mouth 3 (three) times daily as needed (stomach pain).)    sucralfate (CARAFATE) 1 GM/10ML suspension Take 10 mLs (1 g total) by mouth 4 (four) times daily. (Patient taking differently: Take 1 g by mouth 4 (four) times daily as needed (stomach pain).)    tiZANidine (ZANAFLEX) 4 MG tablet Take 1.5 tablets (6 mg total) by mouth every 6 (six) hours as needed for muscle spasms.    Continuous Blood Gluc Transmit (DEXCOM G6 TRANSMITTER) MISC 1 Device by Does not apply route as directed.    dicyclomine (BENTYL) 20 MG tablet Take 1 tablet (20 mg total) by mouth 2 (two) times daily. 02/28/2022: LF 01/19/2022 #20, 10 DS   Fluocinolone Acetonide Scalp 0.01 % OIL Apply 1 application. topically daily as needed (eczema).    fluticasone (FLONASE) 50 MCG/ACT nasal spray Place 2 sprays into both nostrils daily.    haloperidol (HALDOL) 5 MG tablet Take 0.5-1 tablets (2.5-5 mg total) by mouth every 8 (eight) hours as needed (For nausea/vomiting). (Patient not taking: Reported on 02/28/2022)    predniSONE (STERAPRED UNI-PAK 21 TAB) 5 MG (21) TBPK tablet Take as directed (Patient not taking: Reported on 03/08/2022)    No facility-administered encounter medications on file as of 03/08/2022.    Allergies:  Allergies  Allergen Reactions   Elavil [Amitriptyline] Other (See Comments)    Coma    Orudis [Ketoprofen] Nausea And Vomiting   Desyrel [Trazodone] Nausea And Vomiting   Aspirin Nausea Only   Motrin [Ibuprofen] Nausea And Vomiting   Naprosyn [Naproxen] Nausea And Vomiting   Neurontin [Gabapentin] Nausea And Vomiting and Other (See Comments)    upset stomach   Sulfa Antibiotics Nausea And Vomiting   Ultram [Tramadol] Nausea And Vomiting and Other (See Comments)    stomach upset   Victoza [Liraglutide] Nausea And Vomiting   Zegerid [Omeprazole-Sodium Bicarbonate] Nausea And Vomiting    Family History: Family History  Problem Relation Age of Onset   Diabetes Mother    Hypertension Mother    Migraines Paternal Grandfather    Colon cancer Neg Hx    Esophageal cancer Neg Hx    Stomach cancer Neg Hx    Rectal cancer Neg Hx     Social History: Social History   Tobacco Use   Smoking status: Some Days    Packs/day: 0.25    Years: 26.00    Pack years: 6.50    Types: Cigarettes    Last attempt to quit: 09/13/2020    Years since quitting: 1.4   Smokeless tobacco: Never   Tobacco comments:    4-5 cigarettes/day  Vaping Use   Vaping Use: Never used  Substance Use Topics   Alcohol use: No   Drug use: No   Social History   Social History Narrative   Right Handed   Lives in a one story apartment, but lives on the second floor   Drinks caffeine once in awhile    Vital Signs:  BP (!)  179/82   Pulse 99   Resp 20   Ht _0  (1.626 m)   Wt 287 lb (130.2 kg)   SpO2 96%   BMI 49.26 kg/m    General Medical Exam:   General:  Well appearing, very anxious appearing Eyes/ENT: see cranial nerve examination.   Neck:   No carotid bruits. Respiratory:  Clear to auscultation, good air entry bilaterally.   Cardiac:  Regular rate and rhythm, no murmur.   Extremities:  No deformities, edema, or skin discoloration.  Skin:  No rashes or lesions.  Neurological Exam: MENTAL STATUS including orientation to time, place, person, recent and remote memory, attention span  and concentration, language, and fund of knowledge is normal, but her thoughts are tangential .  Speech is not dysarthric.  CRANIAL NERVES: II:  No visual field defects.  Unremarkable fundi.   III-IV-VI: Pupils equal round and reactive to light.  Normal conjugate, extra-ocular eye movements in all directions of gaze.  No nystagmus.  No ptosis.   V:  Normal facial sensation.    VII:  Normal facial symmetry and movements.   VIII:  Normal hearing and vestibular function.   IX-X:  Normal palatal movement.   XI:  Normal shoulder shrug and head rotation.   XII:  Normal tongue strength and range of motion, no deviation or fasciculation.  MOTOR:  No atrophy, fasciculations or abnormal movements.  No pronator drift.    SENSORY:  Normal and symmetric perception of light touch, pinprick, vibration, and proprioception.  Romberg's sign absent.   COORDINATION/GAIT: Normal finger-to- nose-finger and heel-to-shin.  Intact rapid alternating movements bilaterally.  Able to rise from a chair without using arms.  Gait narrow based and stable. Tandem and stressed gait intact.    IMPRESSION/PLAN:   TIA symptoms versus hypoglycemic event  CT head unremarkable but patient continues to complain about "on and off tingling and numbness, none during the visit.  In chart, there was the possibility of conversion syndrome as well.  We will complete the studies pertinent to neurological abnormalities.  A1c was elevated, blood sugar was low on presentation.  Lipid panel as per PCP and cardiology.  Last 2D echo was 2 years ago.   Carotid dopplers  2D echo MRI of the brain without contrast for evaluation of structure and vascular load Recommend PCP to evaluate here polypharmacy Follow-up with cardiology as scheduled Return to clinic as scheduled by Dr. Posey Pronto, if any abnormalities are noted in these studies, the patient is to return earlier than the planned follow-up visit regarding diabetic polyneuropathy, and painful  paresthesias.   Total time spent:  76mn  Thank you for allowing [place] opportunity to participate in the care of this nice patient. Please do not hesitate to contact [place]for any questions or concerns.   Total time spent on today's visit was 51 minutes dedicated to this patient today, preparing to see patient, examining the patient, ordering tests and/or medications and counseling the patient, documenting clinical information in the EHR or other health record, independently interpreting results and communicating results to the patient/family, discussing treatment and goals, answering patient's questions and coordinating care.  Cc:  NFenton Foy NP  SSharene Butters5/24/2023 3:59 PM    Sincerely,   SSharene Butters PA-C

## 2022-03-08 NOTE — Patient Instructions (Addendum)
MRI brain to look inside for anything abnormal  Echocardiogram of the heart Carotid ultrasound Follow up with Dr Posey Pronto in august

## 2022-03-09 ENCOUNTER — Other Ambulatory Visit: Payer: Self-pay | Admitting: Nurse Practitioner

## 2022-03-09 DIAGNOSIS — M62838 Other muscle spasm: Secondary | ICD-10-CM

## 2022-03-10 ENCOUNTER — Other Ambulatory Visit: Payer: Self-pay

## 2022-03-10 DIAGNOSIS — G459 Transient cerebral ischemic attack, unspecified: Secondary | ICD-10-CM

## 2022-03-14 ENCOUNTER — Ambulatory Visit (HOSPITAL_COMMUNITY): Payer: Medicaid Other | Admitting: Licensed Clinical Social Worker

## 2022-03-17 ENCOUNTER — Ambulatory Visit (INDEPENDENT_AMBULATORY_CARE_PROVIDER_SITE_OTHER): Payer: Medicare Other | Admitting: Nurse Practitioner

## 2022-03-17 ENCOUNTER — Encounter: Payer: Self-pay | Admitting: Nurse Practitioner

## 2022-03-17 VITALS — BP 140/66 | HR 70 | Temp 97.9°F | Ht 64.0 in | Wt 278.8 lb

## 2022-03-17 DIAGNOSIS — E119 Type 2 diabetes mellitus without complications: Secondary | ICD-10-CM | POA: Diagnosis not present

## 2022-03-17 DIAGNOSIS — M62838 Other muscle spasm: Secondary | ICD-10-CM

## 2022-03-17 DIAGNOSIS — Z794 Long term (current) use of insulin: Secondary | ICD-10-CM

## 2022-03-17 DIAGNOSIS — E162 Hypoglycemia, unspecified: Secondary | ICD-10-CM

## 2022-03-17 LAB — POCT GLYCOSYLATED HEMOGLOBIN (HGB A1C)
HbA1c POC (<> result, manual entry): 7.3 % (ref 4.0–5.6)
HbA1c, POC (controlled diabetic range): 7.3 % — AB (ref 0.0–7.0)
HbA1c, POC (prediabetic range): 7.3 % — AB (ref 5.7–6.4)
Hemoglobin A1C: 7.3 % — AB (ref 4.0–5.6)

## 2022-03-17 LAB — GLUCOSE, POCT (MANUAL RESULT ENTRY)
POC Glucose: 30 mg/dl — AB (ref 70–99)
POC Glucose: 99 mg/dl (ref 70–99)

## 2022-03-17 MED ORDER — TIZANIDINE HCL 6 MG PO CAPS: 6.0000 mg | ORAL_CAPSULE | Freq: Three times a day (TID) | ORAL | 0 refills | Status: DC

## 2022-03-17 NOTE — Progress Notes (Signed)
_0  ID: Jill Shaw, female    DOB: [birth year], 46 y.o.   MRN: [id number]  Chief Complaint  Patient presents with   Follow-up    Pt is here for 3 months follow up. Pt stated she would like to change her tiZANidine would like to take it 3 times a day instead of the way it is prescribed.pt is requesting refill on Voltaren gel    Referring provider: Vevelyn Francois, NP   HPI  Jill Shaw presents for follow up. She  has a past medical history of Anemia, Arthritis, Asthma, Chronic diastolic (congestive) heart failure ([place]), COPD (chronic obstructive pulmonary disease) ([place]), Diabetes mellitus without complication ([place]), DKA (diabetic ketoacidosis) ([place]) (10/15/2020), Dysfunctional uterine bleeding, GERD (gastroesophageal reflux disease), Hypertension, Neuromuscular disorder ([place]), Seizures ([place]) (09/12/2017), Sickle cell trait ([place]), Smoker, Vitamin D deficiency (10/2019), and Wears glasses.    Patient presents today for a follow-up.  Overall patient states that she has been doing well.  She does need a refill on tizanidine for muscle spasms.  Hemoglobin A1c in office today is 7.3. taking medications as directed. Denies f/c/s, n/v/d, hemoptysis, PND, chest pain or edema.  Note: While in office today patient's blood sugar dropped and was noted to be at 30.  She was given orange juice and crackers.  Within 15 minutes her blood sugar did return to 99.  Patient stated that she felt much better and wanted to go home.  We discussed that she does need to eat 6 small meals throughout the day.  Patient states that she was going to go to have a meal as soon as she left the office.  We discussed if her blood sugar dropped again she does need to go to the emergency room.      Allergies  Allergen Reactions   Elavil [Amitriptyline] Other (See Comments)    Coma   Orudis [Ketoprofen] Nausea And Vomiting   Desyrel [Trazodone] Nausea And Vomiting   Aspirin Nausea Only   Motrin [Ibuprofen] Nausea  And Vomiting   Naprosyn [Naproxen] Nausea And Vomiting   Neurontin [Gabapentin] Nausea And Vomiting and Other (See Comments)    upset stomach   Sulfa Antibiotics Nausea And Vomiting   Ultram [Tramadol] Nausea And Vomiting and Other (See Comments)    stomach upset   Victoza [Liraglutide] Nausea And Vomiting   Zegerid [Omeprazole-Sodium Bicarbonate] Nausea And Vomiting    Immunization History  Administered Date(s) Administered   Influenza,inj,Quad PF,6+ Mos 09/03/2018, 07/03/2019, 07/04/2020, 07/01/2021   Pneumococcal Polysaccharide-23 03/28/2015, 07/04/2020   Tdap 03/27/2016   Unspecified SARS-COV-2 Vaccination 03/25/2020, 05/05/2020    Past Medical History:  Diagnosis Date   Anemia    Anxiety    Arthritis    knees, hands   Asthma    Chronic diastolic (congestive) heart failure (HCC)    COPD (chronic obstructive pulmonary disease) ([place])    Diabetes mellitus without complication ([place])    type 2   DKA (diabetic ketoacidosis) ([place]) 10/15/2020   Dysfunctional uterine bleeding    Gastritis    GERD (gastroesophageal reflux disease)    Hypertension    Neuromuscular disorder ([place])    neuropathy feet   Seizures ([place]) 09/12/2017   pt states r/t stress and blood sugar - no meds last one 4 months ago, not seen neurologist   Sickle cell trait ([place])    Smoker    Vitamin D deficiency 10/2019   Wears glasses     Tobacco History: Social History   Tobacco Use  Smoking Status Some Days   Packs/day: 0.25   Years: 26.00   Pack years: 6.50   Types: Cigarettes   Last attempt to quit: 09/13/2020   Years since quitting: 1.5  Smokeless Tobacco Never  Tobacco Comments   4-5 cigarettes/day   Ready to quit: Not Answered Counseling given: Not Answered Tobacco comments: 4-5 cigarettes/day   Outpatient Encounter Medications as of 03/17/2022  Medication Sig   Accu-Chek Softclix Lancets lancets USE TO TEST AS DIRECTED UP TO FOUR TIMES DAILY.   acetaminophen (TYLENOL) 500 MG tablet Take  1,000 mg by mouth every 6 (six) hours as needed.   albuterol (PROVENTIL) (2.5 MG/3ML) 0.083% nebulizer solution Take 3 mLs (2.5 mg total) by nebulization every 6 (six) hours as needed for wheezing or shortness of breath.   albuterol (VENTOLIN HFA) 108 (90 Base) MCG/ACT inhaler Inhale 2 puffs into the lungs every 6 (six) hours as needed for wheezing or shortness of breath.   blood glucose meter kit and supplies KIT 1 each by Other route See admin instructions. Dispense based on patient and insurance preference. Use up to four times daily as directed. (FOR ICD-9 250.00, 250.01).   Blood Pressure Monitoring (ADULT BLOOD PRESSURE CUFF LG) KIT 1 kit by Does not apply route daily.   budesonide-formoterol (SYMBICORT) 160-4.5 MCG/ACT inhaler INHALE 2 PUFFS INTO THE LUNGS 2 (TWO) TIMES DAILY. (Patient taking differently: Inhale 2 puffs into the lungs 2 (two) times daily.)   cetirizine (ZYRTEC) 10 MG tablet Take 1 tablet (10 mg total) by mouth daily.   Continuous Blood Gluc Sensor (DEXCOM G6 SENSOR) MISC 1 Device by Does not apply route as directed.   Continuous Blood Gluc Transmit (DEXCOM G6 TRANSMITTER) MISC 1 Device by Does not apply route as directed.   Dexlansoprazole (DEXILANT) 30 MG capsule DR Take 1 capsule (30 mg total) by mouth daily.   diclofenac Sodium (VOLTAREN) 1 % GEL APPLY 2 GRAMS TOPICALLY TO THE AFFECTED AREA FOUR TIMES DAILY   dicyclomine (BENTYL) 20 MG tablet Take 1 tablet (20 mg total) by mouth 2 (two) times daily.   empagliflozin (JARDIANCE) 25 MG TABS tablet Take 1 tablet (25 mg total) by mouth daily before breakfast.   escitalopram (LEXAPRO) 10 MG tablet Take 1 tablet (10 mg total) by mouth daily.   Fluocinolone Acetonide Scalp 0.01 % OIL Apply 1 application. topically daily as needed (eczema).   fluticasone (FLONASE) 50 MCG/ACT nasal spray Place 2 sprays into both nostrils daily.   furosemide (LASIX) 40 MG tablet TAKE 1 TABLET(40 MG) BY MOUTH TWICE DAILY (Patient taking differently:  Take 40 mg by mouth 2 (two) times daily.)   glucose blood (ACCU-CHEK GUIDE) test strip USE AS DIRECTED UP TO FOUR TIMES DAILY   Heating Pads (HEATING PAD MOIST/DRY KING SZ) PADS 1 each by Does not apply route 4 (four) times daily as needed.   HUMALOG KWIKPEN 200 UNIT/ML KwikPen Inject 180 Units into the skin daily.   hydrochlorothiazide (MICROZIDE) 12.5 MG capsule TAKE 1 CAPSULE(12.5 MG) BY MOUTH DAILY (Patient taking differently: Take 12.5 mg by mouth daily.)   hydrocortisone 2.5 % cream Apply topically 2 (two) times daily. (Patient taking differently: Apply 1 application. topically 2 (two) times daily.)   hydroquinone 4 % cream APPLY TOPICALLY TWICE DAILY (Patient taking differently: Apply 1 application. topically 2 (two) times daily as needed (pain).)   hydrOXYzine (VISTARIL) 50 MG capsule Take 1 capsule (50 mg total) by mouth 3 (three) times daily.   insulin glargine (LANTUS SOLOSTAR) 100  UNIT/ML Solostar Pen Inject 110 Units into the skin daily. (Patient taking differently: Inject 180 Units into the skin daily.)   Insulin Pen Needle 31G X 8 MM MISC 1 Device by Does not apply route in the morning, at noon, in the evening, and at bedtime.   isosorbide mononitrate (IMDUR) 30 MG 24 hr tablet Take 1 tablet (30 mg total) by mouth daily.   losartan (COZAAR) 100 MG tablet TAKE 1 TABLET BY MOUTH EVERY DAY (Patient taking differently: Take 100 mg by mouth daily.)   metoCLOPramide (REGLAN) 10 MG tablet Take 1 tablet (10 mg total) by mouth every 6 (six) hours as needed for nausea (nausea/headache).   metoprolol succinate (TOPROL-XL) 100 MG 24 hr tablet Take 100 mg by mouth daily. Take with or immediately following a meal.   ondansetron (ZOFRAN) 4 MG tablet Take 1 tablet (4 mg total) by mouth every 8 (eight) hours as needed for nausea or vomiting.   oxyCODONE-acetaminophen (PERCOCET) 10-325 MG tablet Take 1 tablet by mouth 5 (five) times daily as needed for pain.   pantoprazole (PROTONIX) 40 MG tablet Take 1  tablet (40 mg total) by mouth daily.   potassium chloride SA (KLOR-CON M) 20 MEQ tablet TAKE 1 TABLET(20 MEQ) BY MOUTH DAILY (Patient taking differently: Take 20 mEq by mouth daily.)   promethazine (PHENERGAN) 25 MG suppository Place 1 suppository (25 mg total) rectally every 6 (six) hours as needed for nausea or vomiting.   rosuvastatin (CRESTOR) 20 MG tablet Take 1 tablet (20 mg total) by mouth daily.   saxagliptin HCl (ONGLYZA) 5 MG TABS tablet Take 1 tablet (5 mg total) by mouth daily.   SPIRIVA RESPIMAT 2.5 MCG/ACT AERS INHALE 2 PUFFS INTO THE LUNGS DAILY   spironolactone (ALDACTONE) 50 MG tablet TAKE 1 TABLET(50 MG) BY MOUTH DAILY (Patient taking differently: Take 50 mg by mouth daily.)   sucralfate (CARAFATE) 1 g tablet Take 1 tablet (1 g total) by mouth 4 (four) times daily -  with meals and at bedtime. (Patient taking differently: Take 1 g by mouth 3 (three) times daily as needed (stomach pain).)   sucralfate (CARAFATE) 1 GM/10ML suspension Take 10 mLs (1 g total) by mouth 4 (four) times daily. (Patient taking differently: Take 1 g by mouth 4 (four) times daily as needed (stomach pain).)   tizanidine (ZANAFLEX) 6 MG capsule Take 1 capsule (6 mg total) by mouth 3 (three) times daily. For muscle spasms   [DISCONTINUED] tiZANidine (ZANAFLEX) 4 MG tablet Take 1.5 tablets (6 mg total) by mouth every 6 (six) hours as needed for muscle spasms.   Glucosamine Sulfate 1000 MG CAPS Take 1 capsule (1,000 mg total) by mouth 2 (two) times daily.   haloperidol (HALDOL) 5 MG tablet Take 0.5-1 tablets (2.5-5 mg total) by mouth every 8 (eight) hours as needed (For nausea/vomiting). (Patient not taking: Reported on 02/28/2022)   predniSONE (STERAPRED UNI-PAK 21 TAB) 5 MG (21) TBPK tablet Take as directed (Patient not taking: Reported on 03/08/2022)   No facility-administered encounter medications on file as of 03/17/2022.     Review of Systems  Review of Systems     Physical Exam  BP 140/66 (BP  Location: Left Arm, Patient Position: Sitting, Cuff Size: Large)   Pulse 70   Temp 97.9 F (36.6 C)   Ht _0  (1.626 m)   Wt 278 lb 12.8 oz (126.5 kg)   SpO2 100%   BMI 47.86 kg/m   Wt Readings from Last 5 Encounters:  03/17/22 278 lb 12.8 oz (126.5 kg)  03/08/22 287 lb (130.2 kg)  03/07/22 287 lb 3.2 oz (130.3 kg)  03/03/22 278 lb (126.1 kg)  02/28/22 270 lb (122.5 kg)     Physical Exam   Lab Results:  CBC    Component Value Date/Time   WBC 12.4 (H) 02/28/2022 0939   RBC 4.60 02/28/2022 0939   HGB 13.3 02/28/2022 0943   HGB 11.9 07/29/2021 1629   HCT 39.0 02/28/2022 0943   HCT 38.0 07/29/2021 1629   PLT 256 02/28/2022 0939   PLT 271 07/29/2021 1629   MCV 81.1 02/28/2022 0939   MCV 74 (L) 07/29/2021 1629   MCH 27.4 02/28/2022 0939   MCHC 33.8 02/28/2022 0939   RDW 17.0 (H) 02/28/2022 0939   RDW 21.8 (H) 07/29/2021 1629   LYMPHSABS 1.8 02/28/2022 0939   LYMPHSABS 2.3 07/29/2021 1629   MONOABS 0.6 02/28/2022 0939   EOSABS 0.1 02/28/2022 0939   EOSABS 0.4 07/29/2021 1629   BASOSABS 0.1 02/28/2022 0939   BASOSABS 0.0 07/29/2021 1629    BMET    Component Value Date/Time   NA 147 (H) 03/17/2022 1441   K 3.4 (L) 03/17/2022 1441   CL 105 03/17/2022 1441   CO2 22 03/17/2022 1441   GLUCOSE CANCELED 03/17/2022 1441   GLUCOSE 63 (L) 02/28/2022 0943   BUN 8 03/17/2022 1441   CREATININE 1.01 (H) 03/17/2022 1441   CREATININE 1.18 (H) 08/31/2017 1621   CALCIUM 9.6 03/17/2022 1441   GFRNONAA 42 (L) 02/28/2022 0939   GFRNONAA 58 (L) 08/31/2017 1621   GFRAA 49 (L) 12/02/2020 1405   GFRAA 67 08/31/2017 1621    BNP    Component Value Date/Time   BNP 23.3 04/26/2021 0231   BNP 67.5 03/30/2017 1438    ProBNP    Component Value Date/Time   PROBNP 57.0 07/12/2020 1649    Imaging: EEG adult  Result Date: 02/28/2022 Lora Havens, MD     02/28/2022 11:17 AM Patient Name: ALANNI VADER MRN: [id number] Epilepsy Attending: Lora Havens Referring  Physician/Provider: Kerney Elbe, MD Date:  02/28/2022 Duration: 25.38 mins Patient history: 46 year old female with acute onset of left facial twitching, left facial droop and left sided numbness in the context of hypoglycemia.  EEG to evaluate for seizure Level of alertness: Awake AEDs during EEG study: None Technical aspects: This EEG study was done with scalp electrodes positioned according to the 10-20 International system of electrode placement. Electrical activity was acquired at a sampling rate of _0  and reviewed with a high frequency filter of _1  and a low frequency filter of _2 . EEG data were recorded continuously and digitally stored. Description: The posterior dominant rhythm consists of 9 Hz activity of moderate voltage (25-35 uV) seen predominantly in posterior head regions, symmetric and reactive to eye opening and eye closing.  Hyperventilation and photic stimulation were not performed.   Of note, a flutter and EKG artifact was seen throughout the study. IMPRESSION: This study is within normal limits. No seizures or epileptiform discharges were seen throughout the recording. Lora Havens   CT HEAD CODE STROKE WO CONTRAST  Result Date: 02/28/2022 CLINICAL DATA:  Code stroke.  Weakness EXAM: CT HEAD WITHOUT CONTRAST TECHNIQUE: Contiguous axial images were obtained from the base of the skull through the vertex without intravenous contrast. RADIATION DOSE REDUCTION: This exam was performed according to the departmental dose-optimization program which includes automated exposure control, adjustment of the mA and/or kV according to  patient size and/or use of iterative reconstruction technique. COMPARISON:  CT head 08/22/2021 FINDINGS: Brain: There is no evidence of acute intracranial hemorrhage, extra-axial fluid collection, or acute infarct. Parenchymal volume is normal. The ventricles are normal in size. Gray-white differentiation is preserved. There is no mass lesion.  There is no mass  effect or midline shift. Vascular: No hyperdense vessel or unexpected calcification. Skull: Normal. Negative for fracture or focal lesion. Sinuses/Orbits: The paranasal sinuses and mastoid air cells are clear. Bilateral lens implants are in place. The globes and orbits are otherwise unremarkable. Other: None. ASPECTS [facility] Stroke Program Early CT Score) - Ganglionic level infarction (caudate, lentiform nuclei, internal capsule, insula, M1-M3 cortex): 7 - Supraganglionic infarction (M4-M6 cortex): 3 Total score (0-10 with 10 being normal): 10 IMPRESSION: 1. No acute intracranial pathology. 2. ASPECTS is 10 These results were paged via Amion at the time of interpretation on 02/28/2022 at 9:51 am to provider Dr Cheral Marker. Electronically Signed   By: Valetta Mole M.D.   On: 02/28/2022 09:53     Assessment & Plan:   Muscle spasms of both lower extremities - tizanidine (ZANAFLEX) 6 MG capsule; Take 1 capsule (6 mg total) by mouth 3 (three) times daily. For muscle spasms  Dispense: 90 capsule; Refill: 0  2. Type 2 diabetes mellitus without complication, with long-term current use of insulin (HCC)  - Comprehensive metabolic panel - POCT glycosylated hemoglobin (Hb A1C) - POCT glucose (manual entry) - POCT glucose (manual entry)  Lab Results  Component Value Date   HGBA1C 7.3 (A) 03/17/2022   HGBA1C 7.3 03/17/2022   HGBA1C 7.3 (A) 03/17/2022   HGBA1C 7.3 (A) 03/17/2022   Note: While in office today patient's blood sugar dropped and was noted to be at 30.  She was given orange juice and crackers.  Within 15 minutes her blood sugar did return to 99.  Patient stated that she felt much better and wanted to go home.  We discussed that she does need to eat 6 small meals throughout the day.  Patient states that she was going to go to have a meal as soon as she left the office.  We discussed if her blood sugar dropped again she does need to go to the emergency room.  Follow up:     Fenton Foy,  NP 03/19/2022

## 2022-03-18 LAB — COMPREHENSIVE METABOLIC PANEL
ALT: 11 IU/L (ref 0–32)
AST: 19 IU/L (ref 0–40)
Albumin/Globulin Ratio: 1.6 (ref 1.2–2.2)
Albumin: 4.4 g/dL (ref 3.8–4.8)
Alkaline Phosphatase: 112 IU/L (ref 44–121)
BUN/Creatinine Ratio: 8 — ABNORMAL LOW (ref 9–23)
BUN: 8 mg/dL (ref 6–24)
Bilirubin Total: 0.2 mg/dL (ref 0.0–1.2)
CO2: 22 mmol/L (ref 20–29)
Calcium: 9.6 mg/dL (ref 8.7–10.2)
Chloride: 105 mmol/L (ref 96–106)
Creatinine, Ser: 1.01 mg/dL — ABNORMAL HIGH (ref 0.57–1.00)
Globulin, Total: 2.7 g/dL (ref 1.5–4.5)
Potassium: 3.4 mmol/L — ABNORMAL LOW (ref 3.5–5.2)
Sodium: 147 mmol/L — ABNORMAL HIGH (ref 134–144)
Total Protein: 7.1 g/dL (ref 6.0–8.5)
eGFR: 70 mL/min/{1.73_m2} (ref >59)

## 2022-03-19 ENCOUNTER — Encounter: Payer: Self-pay | Admitting: Nurse Practitioner

## 2022-03-19 ENCOUNTER — Telehealth: Payer: Self-pay | Admitting: Nurse Practitioner

## 2022-03-19 NOTE — Assessment & Plan Note (Signed)
-   tizanidine (ZANAFLEX) 6 MG capsule; Take 1 capsule (6 mg total) by mouth 3 (three) times daily. For muscle spasms  Dispense: 90 capsule; Refill: 0  2. Type 2 diabetes mellitus without complication, with long-term current use of insulin (HCC)  - Comprehensive metabolic panel - POCT glycosylated hemoglobin (Hb A1C) - POCT glucose (manual entry) - POCT glucose (manual entry)  Lab Results  Component Value Date   HGBA1C 7.3 (A) 03/17/2022   HGBA1C 7.3 03/17/2022   HGBA1C 7.3 (A) 03/17/2022   HGBA1C 7.3 (A) 03/17/2022   Note: While in office today patient's blood sugar dropped and was noted to be at 30.  She was given orange juice and crackers.  Within 15 minutes her blood sugar did return to 99.  Patient stated that she felt much better and wanted to go home.  We discussed that she does need to eat 6 small meals throughout the day.  Patient states that she was going to go to have a meal as soon as she left the office.  We discussed if her blood sugar dropped again she does need to go to the emergency room.  Follow up:

## 2022-03-19 NOTE — Telephone Encounter (Signed)
Please call to let patient know that I have placed a referral for her to diabetic nutritionist and to endocrinology for diabetic medication management. She will need a follow up wit me in 2 weeks and she needs to bring all her medications to that visit. Follow up for hypoglycemia and repeat blood work. Thanks.

## 2022-03-19 NOTE — Patient Instructions (Addendum)
1. Muscle spasms of both lower extremities  - tizanidine (ZANAFLEX) 6 MG capsule; Take 1 capsule (6 mg total) by mouth 3 (three) times daily. For muscle spasms  Dispense: 90 capsule; Refill: 0  2. Type 2 diabetes mellitus without complication, with long-term current use of insulin (HCC)  - Comprehensive metabolic panel - POCT glycosylated hemoglobin (Hb A1C) - POCT glucose (manual entry) - POCT glucose (manual entry)  Lab Results  Component Value Date   HGBA1C 7.3 (A) 03/17/2022   HGBA1C 7.3 03/17/2022   HGBA1C 7.3 (A) 03/17/2022   HGBA1C 7.3 (A) 03/17/2022   Note: While in office today patient's blood sugar dropped and was noted to be at 30.  She was given orange juice and crackers.  Within 15 minutes her blood sugar did return to 99.  Patient stated that she felt much better and wanted to go home.  We discussed that she does need to eat 6 small meals throughout the day.  Patient states that she was going to go to have a meal as soon as she left the office.  We discussed if her blood sugar dropped again she does need to go to the emergency room.  Follow up:  Follow up in 2 weeks or sooner if needed

## 2022-03-20 NOTE — Telephone Encounter (Signed)
Mychart message was sent to the patient and patient has been scheduled for her 2 week follow up

## 2022-03-21 ENCOUNTER — Telehealth: Payer: Self-pay

## 2022-03-21 NOTE — Telephone Encounter (Signed)
tizanidine (ZANAFLEX) 6 MG capsule    Prior authorization started on 03/21/22

## 2022-03-22 ENCOUNTER — Encounter (HOSPITAL_COMMUNITY): Payer: Self-pay | Admitting: Physician Assistant

## 2022-03-22 NOTE — Telephone Encounter (Signed)
Approved on June 6 Request Reference Number: [id number]. TIZANIDINE CAP 6MG  is approved through 03/22/2023.

## 2022-03-23 ENCOUNTER — Other Ambulatory Visit: Payer: Self-pay | Admitting: Cardiology

## 2022-03-23 DIAGNOSIS — R079 Chest pain, unspecified: Secondary | ICD-10-CM

## 2022-03-24 ENCOUNTER — Telehealth: Payer: Self-pay

## 2022-03-24 NOTE — Telephone Encounter (Signed)
Jill Shaw called on 03/24/2022 to cancel her 03/28/2022 appointment.Because she has an abdominal pain flare-up. And does not think she will be any better soon. She was rescheduled with [name] NP on 05/02/2022 for an office visit.   Patient wanted to make sure her pain medication will be refilled on time. She will be out of Oxycodone 10-325 MG on 03/03/2022.  PMP report:  Filled  Written  ID  Drug  QTY  Days  Prescriber  RX #  Dispenser  Refill  Daily Dose*  Pymt Type  PMP  03/03/2022 03/03/2022 2  Oxycodone-Acetaminophen 10-325 150.00 30 [facility] [id number] [facility] ([id number]) 0/0 75.00 MME Medicaid N

## 2022-03-27 MED ORDER — OXYCODONE-ACETAMINOPHEN 10-325 MG PO TABS: 1.0000 | ORAL_TABLET | Freq: Every day | ORAL | 0 refills | Status: DC | PRN

## 2022-03-27 NOTE — Telephone Encounter (Signed)
PMP was Reviewed Oxycodone e-scribed today.  Jill Shaw is aware of the above.

## 2022-03-27 NOTE — Telephone Encounter (Signed)
Please call Jill Shaw,  She called in on Friday, saying she will be sick on Tuesday. Please Clarify.

## 2022-03-27 NOTE — Addendum Note (Signed)
Addended by: Jones Bales on: 03/27/2022 02:42 PM   Modules accepted: Orders

## 2022-03-27 NOTE — Telephone Encounter (Signed)
Spoke with patient and she said she is having a flare up and it usually last about a week and a half. She states she is in a lot of pain and has been to the hospital several times for this. She has rescheduled her appointment for 05/02/22.

## 2022-03-28 ENCOUNTER — Encounter: Payer: Medicaid Other | Admitting: Registered Nurse

## 2022-03-29 ENCOUNTER — Other Ambulatory Visit: Payer: Medicaid Other

## 2022-03-30 ENCOUNTER — Other Ambulatory Visit: Payer: Self-pay

## 2022-03-30 ENCOUNTER — Encounter (HOSPITAL_BASED_OUTPATIENT_CLINIC_OR_DEPARTMENT_OTHER): Payer: Self-pay | Admitting: Emergency Medicine

## 2022-03-30 ENCOUNTER — Encounter: Payer: Self-pay | Admitting: Physical Medicine and Rehabilitation

## 2022-03-30 ENCOUNTER — Emergency Department (HOSPITAL_BASED_OUTPATIENT_CLINIC_OR_DEPARTMENT_OTHER)
Admission: EM | Admit: 2022-03-30 | Discharge: 2022-03-31 | Disposition: A | Discharge status: Home / Self Care | Payer: Medicare Other | Attending: Emergency Medicine | Admitting: Emergency Medicine

## 2022-03-30 DIAGNOSIS — Z79899 Other long term (current) drug therapy: Secondary | ICD-10-CM | POA: Insufficient documentation

## 2022-03-30 DIAGNOSIS — I10 Essential (primary) hypertension: Secondary | ICD-10-CM | POA: Insufficient documentation

## 2022-03-30 DIAGNOSIS — M545 Low back pain, unspecified: Secondary | ICD-10-CM | POA: Diagnosis not present

## 2022-03-30 DIAGNOSIS — Z955 Presence of coronary angioplasty implant and graft: Secondary | ICD-10-CM | POA: Diagnosis not present

## 2022-03-30 DIAGNOSIS — Z7984 Long term (current) use of oral hypoglycemic drugs: Secondary | ICD-10-CM | POA: Diagnosis not present

## 2022-03-30 DIAGNOSIS — R739 Hyperglycemia, unspecified: Secondary | ICD-10-CM

## 2022-03-30 DIAGNOSIS — Z7951 Long term (current) use of inhaled steroids: Secondary | ICD-10-CM | POA: Insufficient documentation

## 2022-03-30 DIAGNOSIS — G8929 Other chronic pain: Secondary | ICD-10-CM | POA: Insufficient documentation

## 2022-03-30 DIAGNOSIS — J449 Chronic obstructive pulmonary disease, unspecified: Secondary | ICD-10-CM | POA: Diagnosis not present

## 2022-03-30 DIAGNOSIS — J45909 Unspecified asthma, uncomplicated: Secondary | ICD-10-CM | POA: Insufficient documentation

## 2022-03-30 DIAGNOSIS — F17210 Nicotine dependence, cigarettes, uncomplicated: Secondary | ICD-10-CM | POA: Diagnosis not present

## 2022-03-30 DIAGNOSIS — E1165 Type 2 diabetes mellitus with hyperglycemia: Secondary | ICD-10-CM | POA: Diagnosis not present

## 2022-03-30 DIAGNOSIS — R1084 Generalized abdominal pain: Secondary | ICD-10-CM | POA: Insufficient documentation

## 2022-03-30 DIAGNOSIS — Z794 Long term (current) use of insulin: Secondary | ICD-10-CM | POA: Diagnosis not present

## 2022-03-30 HISTORY — DX: Type 2 diabetes mellitus without complications: E11.9

## 2022-03-30 LAB — COMPREHENSIVE METABOLIC PANEL
ALT: 7 U/L (ref 0–44)
AST: 11 U/L — ABNORMAL LOW (ref 15–41)
Albumin: 4.4 g/dL (ref 3.5–5.0)
Alkaline Phosphatase: 78 U/L (ref 38–126)
Anion gap: 15 (ref 5–15)
BUN: 6 mg/dL (ref 6–20)
CO2: 21 mmol/L — ABNORMAL LOW (ref 22–32)
Calcium: 10.2 mg/dL (ref 8.9–10.3)
Chloride: 100 mmol/L (ref 98–111)
Creatinine, Ser: 1.12 mg/dL — ABNORMAL HIGH (ref 0.44–1.00)
GFR, Estimated: >60 mL/min (ref >60)
Glucose, Bld: 408 mg/dL — ABNORMAL HIGH (ref 70–99)
Potassium: 3.2 mmol/L — ABNORMAL LOW (ref 3.5–5.1)
Sodium: 136 mmol/L (ref 135–145)
Total Bilirubin: 1 mg/dL (ref 0.3–1.2)
Total Protein: 7.5 g/dL (ref 6.5–8.1)

## 2022-03-30 LAB — CBC WITH DIFFERENTIAL/PLATELET
Abs Immature Granulocytes: 0.04 10*3/uL (ref 0.00–0.07)
Basophils Absolute: 0.1 10*3/uL (ref 0.0–0.1)
Basophils Relative: 1 %
Eosinophils Absolute: 0.4 10*3/uL (ref 0.0–0.5)
Eosinophils Relative: 3 %
HCT: 37 % (ref 36.0–46.0)
Hemoglobin: 12.3 g/dL (ref 12.0–15.0)
Immature Granulocytes: 0 %
Lymphocytes Relative: 27 %
Lymphs Abs: 3 10*3/uL (ref 0.7–4.0)
MCH: 26.7 pg (ref 26.0–34.0)
MCHC: 33.2 g/dL (ref 30.0–36.0)
MCV: 80.3 fL (ref 80.0–100.0)
Monocytes Absolute: 0.7 10*3/uL (ref 0.1–1.0)
Monocytes Relative: 6 %
Neutro Abs: 7 10*3/uL (ref 1.7–7.7)
Neutrophils Relative %: 63 %
Platelets: 226 10*3/uL (ref 150–400)
RBC: 4.61 MIL/uL (ref 3.87–5.11)
RDW: 16.8 % — ABNORMAL HIGH (ref 11.5–15.5)
WBC: 11.1 10*3/uL — ABNORMAL HIGH (ref 4.0–10.5)
nRBC: 0 % (ref 0.0–0.2)

## 2022-03-30 LAB — LIPASE, BLOOD: Lipase: 14 U/L (ref 11–51)

## 2022-03-30 MED ORDER — HALOPERIDOL LACTATE 5 MG/ML IJ SOLN
5.0000 mg | Freq: Once | INTRAMUSCULAR | Status: AC
Start: 2022-03-30 — End: 2022-03-30
  Administered 2022-03-30: 5 mg via INTRAVENOUS
  Filled 2022-03-30: qty 1

## 2022-03-30 MED ORDER — INSULIN ASPART 100 UNIT/ML IJ SOLN
10.0000 [IU] | Freq: Once | INTRAMUSCULAR | Status: AC
  Administered 2022-03-31: 10 [IU] via SUBCUTANEOUS

## 2022-03-30 MED ORDER — SODIUM CHLORIDE 0.9 % IV BOLUS
1000.0000 mL | Freq: Once | INTRAVENOUS | Status: AC
  Administered 2022-03-30: 1000 mL via INTRAVENOUS

## 2022-03-30 MED ORDER — SODIUM CHLORIDE 0.9 % IV BOLUS
1000.0000 mL | Freq: Once | INTRAVENOUS | Status: AC
  Administered 2022-03-31: 1000 mL via INTRAVENOUS

## 2022-03-30 MED ORDER — HYDROMORPHONE HCL 1 MG/ML IJ SOLN
1.0000 mg | Freq: Once | INTRAMUSCULAR | Status: AC
  Administered 2022-03-30: 1 mg via INTRAVENOUS
  Filled 2022-03-30: qty 1

## 2022-03-30 NOTE — ED Triage Notes (Signed)
Pt c/o generalized abdominal pain with lower back pain that started yesterday. Pain associated nausea and vomiting.

## 2022-03-30 NOTE — ED Provider Notes (Addendum)
[facility] Provider Note: Georgena Spurling, MD, FACEP  CSN: [id number] MRN: [id number] ARRIVAL: 03/30/22 at 2247 ROOM: [place]  Abdominal Pain and Back Pain   HISTORY OF PRESENT ILLNESS  03/30/22 10:59 PM Jill Shaw is a 46 y.o. female with chronic abdominal pain on chronic narcotics (currently receiving #150, 10/325 oxycodone/APAP tablets monthly).   She is here with 1 day (as she told her triage nurse) or 5 days (as she told me) of generalized abdominal pain which she rates as a 10 out of 10.  She states that despite taking her usual oxycodone sometimes she develops "inflammatory gastritis" that does not respond to her oxycodone and she has to come to the ED for "Delilah".  She states she has not been able to eat at home and has not vomited only because she has had nothing on her stomach.  The only thing that has provided any relief is getting in a hot tub of water.  She denies any marijuana use.  She denies diarrhea   Past Medical History:  Diagnosis Date   Anemia    Anxiety    Arthritis    knees, hands   Asthma    Chronic diastolic (congestive) heart failure (HCC)    COPD (chronic obstructive pulmonary disease) (HCC)    Diabetes mellitus ([place])    DKA (diabetic ketoacidosis) (HCC)    Dysfunctional uterine bleeding    Gastritis    GERD (gastroesophageal reflux disease)    Hypertension    Neuromuscular disorder ([place])    neuropathy feet   Seizures ([place]) 09/12/2017   pt states r/t stress and blood sugar - no meds last one 4 months ago, not seen neurologist   Sickle cell trait ([place])    Smoker    Vitamin D deficiency 10/2019   Wears glasses     Past Surgical History:  Procedure Laterality Date   CESAREAN SECTION     x 1. for twins   DILATION AND CURETTAGE OF UTERUS N/A 08/20/2019   Procedure: DILATATION AND CURETTAGE;  Surgeon: Emily Filbert, MD;  Location: [facility];  Service: Gynecology;  Laterality: N/A;   ENDOMETRIAL ABLATION N/A  08/20/2019   Procedure: [place] Ablation;  Surgeon: Emily Filbert, MD;  Location: [facility];  Service: Gynecology;  Laterality: N/A;   EYE SURGERY Bilateral    laser right and cataract removed left eye   LEFT HEART CATH AND CORONARY ANGIOGRAPHY N/A 08/30/2021   Procedure: LEFT HEART CATH AND CORONARY ANGIOGRAPHY;  Surgeon: Nigel Mormon, MD;  Location: [facility];  Service: Cardiovascular;  Laterality: N/A;   RADIOLOGY WITH ANESTHESIA N/A 09/16/2019   Procedure: MRI WITH ANESTHESIA   L SPINE WITHOUT CONTRAST, T SPINE WITHOUT CONTRAST , CERVICAL WITHOUT CONTRAST;  Surgeon: Radiologist, Medication, MD;  Location: [facility];  Service: Radiology;  Laterality: N/A;   TUBAL LIGATION     interval BTL   UPPER GI ENDOSCOPY  07/2017    Family History  Problem Relation Age of Onset   Diabetes Mother    Hypertension Mother    Migraines Paternal Grandfather    Colon cancer Neg Hx    Esophageal cancer Neg Hx    Stomach cancer Neg Hx    Rectal cancer Neg Hx     Social History   Tobacco Use   Smoking status: Some Days    Packs/day: 0.25    Years: 26.00    Total pack years: 6.50    Types:  Cigarettes    Last attempt to quit: 09/13/2020    Years since quitting: 1.5   Smokeless tobacco: Never   Tobacco comments:    4-5 cigarettes/day  Vaping Use   Vaping Use: Never used  Substance Use Topics   Alcohol use: No   Drug use: No    Prior to Admission medications   Medication Sig Start Date End Date Taking? Authorizing Provider  Accu-Chek Softclix Lancets lancets USE TO TEST AS DIRECTED UP TO FOUR TIMES DAILY. 03/06/22   Shamleffer, Melanie Crazier, MD  acetaminophen (TYLENOL) 500 MG tablet Take 1,000 mg by mouth every 6 (six) hours as needed.    [provider]  albuterol (PROVENTIL) (2.5 MG/3ML) 0.083% nebulizer solution Take 3 mLs (2.5 mg total) by nebulization every 6 (six) hours as needed for wheezing or shortness of breath. 07/18/21   Margaretha Seeds, MD  albuterol (VENTOLIN  HFA) 108 (90 Base) MCG/ACT inhaler Inhale 2 puffs into the lungs every 6 (six) hours as needed for wheezing or shortness of breath. 07/18/21   Margaretha Seeds, MD  blood glucose meter kit and supplies KIT 1 each by Other route See admin instructions. Dispense based on patient and insurance preference. Use up to four times daily as directed. (FOR ICD-9 250.00, 250.01). 09/02/20   Vevelyn Francois, NP  Blood Pressure Monitoring (ADULT BLOOD PRESSURE CUFF LG) KIT 1 kit by Does not apply route daily. 05/05/21   Vevelyn Francois, NP  budesonide-formoterol (SYMBICORT) 160-4.5 MCG/ACT inhaler INHALE 2 PUFFS INTO THE LUNGS 2 (TWO) TIMES DAILY. Patient taking differently: Inhale 2 puffs into the lungs 2 (two) times daily. 07/18/21   Margaretha Seeds, MD  cetirizine (ZYRTEC) 10 MG tablet Take 1 tablet (10 mg total) by mouth daily. 06/07/21   Vevelyn Francois, NP  Continuous Blood Gluc Sensor (DEXCOM G6 SENSOR) MISC 1 Device by Does not apply route as directed. 06/27/21   Shamleffer, Melanie Crazier, MD  Continuous Blood Gluc Transmit (DEXCOM G6 TRANSMITTER) MISC 1 Device by Does not apply route as directed. 06/27/21   Shamleffer, Melanie Crazier, MD  Dexlansoprazole (DEXILANT) 30 MG capsule DR Take 1 capsule (30 mg total) by mouth daily. 11/11/21 11/11/22  Pyrtle, Lajuan Lines, MD  diclofenac Sodium (VOLTAREN) 1 % GEL APPLY 2 GRAMS TOPICALLY TO THE AFFECTED AREA FOUR TIMES DAILY 03/05/22   Sherilyn Cooter, MD  dicyclomine (BENTYL) 20 MG tablet Take 1 tablet (20 mg total) by mouth 2 (two) times daily. 01/20/22   Margarita Mail, PA-C  empagliflozin (JARDIANCE) 25 MG TABS tablet Take 1 tablet (25 mg total) by mouth daily before breakfast. 06/27/21   Shamleffer, Melanie Crazier, MD  escitalopram (LEXAPRO) 10 MG tablet Take 1 tablet (10 mg total) by mouth daily. 01/11/22 04/22/22  Fenton Foy, NP  Fluocinolone Acetonide Scalp 0.01 % OIL Apply 1 application. topically daily as needed (eczema).    [provider]   fluticasone (FLONASE) 50 MCG/ACT nasal spray Place 2 sprays into both nostrils daily. 12/02/20   Vevelyn Francois, NP  furosemide (LASIX) 40 MG tablet TAKE 1 TABLET(40 MG) BY MOUTH TWICE DAILY Patient taking differently: Take 40 mg by mouth 2 (two) times daily. 01/24/22   Patwardhan, Reynold Bowen, MD  Glucosamine Sulfate 1000 MG CAPS Take 1 capsule (1,000 mg total) by mouth 2 (two) times daily. 08/22/19   Hilts, Michael, MD  glucose blood (ACCU-CHEK GUIDE) test strip USE AS DIRECTED UP TO FOUR TIMES DAILY 07/29/21   Bo Merino I,  NP  haloperidol (HALDOL) 5 MG tablet Take 1 tablet (5 mg total) by mouth every 8 (eight) hours as needed (For nausea/vomiting). 03/31/22   Malcolm Quast, MD  Heating Pads (HEATING PAD MOIST/DRY KING SZ) PADS 1 each by Does not apply route 4 (four) times daily as needed. 02/14/21   Raulkar, Clide Deutscher, MD  HUMALOG KWIKPEN 200 UNIT/ML KwikPen Inject 180 Units into the skin daily. 02/15/22   [provider]  hydrochlorothiazide (MICROZIDE) 12.5 MG capsule TAKE 1 CAPSULE(12.5 MG) BY MOUTH DAILY Patient taking differently: Take 12.5 mg by mouth daily. 12/14/21   Cantwell, Celeste C, PA-C  hydrocortisone 2.5 % cream Apply topically 2 (two) times daily. Patient taking differently: Apply 1 application. topically 2 (two) times daily. 09/22/20   Vevelyn Francois, NP  hydroquinone 4 % cream APPLY TOPICALLY TWICE DAILY Patient taking differently: Apply 1 application. topically 2 (two) times daily as needed (pain). 09/13/20   Vevelyn Francois, NP  insulin glargine (LANTUS SOLOSTAR) 100 UNIT/ML Solostar Pen Inject 110 Units into the skin daily. Patient taking differently: Inject 180 Units into the skin daily. 06/27/21   Shamleffer, Melanie Crazier, MD  Insulin Pen Needle 31G X 8 MM MISC 1 Device by Does not apply route in the morning, at noon, in the evening, and at bedtime. 02/16/21   Shamleffer, Melanie Crazier, MD  isosorbide mononitrate (IMDUR) 30 MG 24 hr tablet TAKE 1 TABLET(30 MG) BY  MOUTH DAILY 03/23/22   Patwardhan, Manish J, MD  losartan (COZAAR) 100 MG tablet TAKE 1 TABLET BY MOUTH EVERY DAY Patient taking differently: Take 100 mg by mouth daily. 10/18/21   Patwardhan, Reynold Bowen, MD  metoCLOPramide (REGLAN) 10 MG tablet Take 1 tablet (10 mg total) by mouth every 6 (six) hours as needed for nausea (nausea/headache). 01/20/22   Margarita Mail, PA-C  metoprolol succinate (TOPROL-XL) 100 MG 24 hr tablet Take 100 mg by mouth daily. Take with or immediately following a meal.    [provider]  ondansetron (ZOFRAN) 4 MG tablet Take 1 tablet (4 mg total) by mouth every 8 (eight) hours as needed for nausea or vomiting. 11/11/21   Pyrtle, Lajuan Lines, MD  oxyCODONE-acetaminophen (PERCOCET) 10-325 MG tablet Take 1 tablet by mouth 5 (five) times daily as needed for pain. Do Not Fill Before 03/31/2022 03/27/22 03/27/23  Bayard Hugger, NP  pantoprazole (PROTONIX) 40 MG tablet Take 1 tablet (40 mg total) by mouth daily. 01/20/22   Harris, Abigail, PA-C  potassium chloride SA (KLOR-CON M) 20 MEQ tablet TAKE 1 TABLET(20 MEQ) BY MOUTH DAILY Patient taking differently: Take 20 mEq by mouth daily. 02/08/22   Lorretta Harp, MD  predniSONE (STERAPRED UNI-PAK 21 TAB) 5 MG (21) TBPK tablet Take as directed Patient not taking: Reported on 03/08/2022 11/08/21   Aundra Dubin, PA-C  promethazine (PHENERGAN) 25 MG suppository Place 1 suppository (25 mg total) rectally every 6 (six) hours as needed for nausea or vomiting. 01/20/22   Harris, Vernie Shanks, PA-C  rosuvastatin (CRESTOR) 20 MG tablet Take 1 tablet (20 mg total) by mouth daily. 07/07/21   Patwardhan, Reynold Bowen, MD  saxagliptin HCl (ONGLYZA) 5 MG TABS tablet Take 1 tablet (5 mg total) by mouth daily. 12/02/20 04/22/22  Vevelyn Francois, NP  SPIRIVA RESPIMAT 2.5 MCG/ACT AERS INHALE 2 PUFFS INTO THE LUNGS DAILY 02/03/22   Icard, Octavio Graves, DO  spironolactone (ALDACTONE) 50 MG tablet TAKE 1 TABLET(50 MG) BY MOUTH DAILY Patient taking differently: Take 50 mg by  mouth daily. 12/22/20   Patwardhan, Reynold Bowen, MD  sucralfate (CARAFATE) 1 g tablet Take 1 tablet (1 g total) by mouth 4 (four) times daily -  with meals and at bedtime. Patient taking differently: Take 1 g by mouth 3 (three) times daily as needed (stomach pain). 08/21/21   Isla Pence, MD  sucralfate (CARAFATE) 1 GM/10ML suspension Take 10 mLs (1 g total) by mouth 4 (four) times daily. Patient taking differently: Take 1 g by mouth 4 (four) times daily as needed (stomach pain). 11/11/21   Pyrtle, Lajuan Lines, MD  tizanidine (ZANAFLEX) 6 MG capsule Take 1 capsule (6 mg total) by mouth 3 (three) times daily. For muscle spasms 03/17/22 04/16/22  Fenton Foy, NP    Allergies Elavil [amitriptyline], Orudis [ketoprofen], Desyrel [trazodone], Aspirin, Motrin [ibuprofen], Naprosyn [naproxen], Neurontin [gabapentin], Sulfa antibiotics, Ultram [tramadol], Victoza [liraglutide], and Zegerid [omeprazole-sodium bicarbonate]   REVIEW OF SYSTEMS  Negative except as noted here or in the History of Present Illness.   PHYSICAL EXAMINATION  Initial Vital Signs Blood pressure (!) 170/110, pulse (!) 102, temperature 100.1 F (37.8 C), temperature source Oral, resp. rate (!) 23, SpO2 99 %.  Examination General: Well-developed, well-nourished female in no acute distress; appearance consistent with age of record HENT: normocephalic; atraumatic Eyes: Normal appearance Neck: supple Heart: regular rate and rhythm Lungs: clear to auscultation bilaterally Abdomen: soft; nondistended; diffusely tender; bowel sounds present Extremities: No deformity; full range of motion; pulses normal Neurologic: Awake, alert and oriented; motor function intact in all extremities and symmetric; no facial droop Skin: Warm and dry Psychiatric: Grimacing   RESULTS  Summary of this visit's results, reviewed and interpreted by myself:   EKG Interpretation  Date/Time:    Ventricular Rate:    PR Interval:    QRS Duration:   QT  Interval:    QTC Calculation:   R Axis:     Text Interpretation:         Laboratory Studies: Results for orders placed or performed during the hospital encounter of 03/30/22 (from the past 24 hour(s))  CBC with Differential     Status: Abnormal   Collection Time: 03/30/22 11:08 PM  Result Value Ref Range   WBC 11.1 (H) 4.0 - 10.5 K/uL   RBC 4.61 3.87 - 5.11 MIL/uL   Hemoglobin 12.3 12.0 - 15.0 g/dL   HCT 37.0 36.0 - 46.0 %   MCV 80.3 80.0 - 100.0 fL   MCH 26.7 26.0 - 34.0 pg   MCHC 33.2 30.0 - 36.0 g/dL   RDW 16.8 (H) 11.5 - 15.5 %   Platelets 226 150 - 400 K/uL   nRBC 0.0 0.0 - 0.2 %   Neutrophils Relative % 63 %   Neutro Abs 7.0 1.7 - 7.7 K/uL   Lymphocytes Relative 27 %   Lymphs Abs 3.0 0.7 - 4.0 K/uL   Monocytes Relative 6 %   Monocytes Absolute 0.7 0.1 - 1.0 K/uL   Eosinophils Relative 3 %   Eosinophils Absolute 0.4 0.0 - 0.5 K/uL   Basophils Relative 1 %   Basophils Absolute 0.1 0.0 - 0.1 K/uL   Immature Granulocytes 0 %   Abs Immature Granulocytes 0.04 0.00 - 0.07 K/uL  Comprehensive metabolic panel     Status: Abnormal   Collection Time: 03/30/22 11:08 PM  Result Value Ref Range   Sodium 136 135 - 145 mmol/L   Potassium 3.2 (L) 3.5 - 5.1 mmol/L   Chloride 100 98 - 111 mmol/L  CO2 21 (L) 22 - 32 mmol/L   Glucose, Bld 408 (H) 70 - 99 mg/dL   BUN 6 6 - 20 mg/dL   Creatinine, Ser 1.12 (H) 0.44 - 1.00 mg/dL   Calcium 10.2 8.9 - 10.3 mg/dL   Total Protein 7.5 6.5 - 8.1 g/dL   Albumin 4.4 3.5 - 5.0 g/dL   AST 11 (L) 15 - 41 U/L   ALT 7 0 - 44 U/L   Alkaline Phosphatase 78 38 - 126 U/L   Total Bilirubin 1.0 0.3 - 1.2 mg/dL   GFR, Estimated >60 >60 mL/min   Anion gap 15 5 - 15  Lipase, blood     Status: None   Collection Time: 03/30/22 11:08 PM  Result Value Ref Range   Lipase 14 11 - 51 U/L  hCG, serum, qualitative     Status: None   Collection Time: 03/30/22 11:15 PM  Result Value Ref Range   Preg, Serum NEGATIVE NEGATIVE  CBG monitoring, ED     Status:  Abnormal   Collection Time: 03/31/22 12:37 AM  Result Value Ref Range   Glucose-Capillary 414 (H) 70 - 99 mg/dL  Urinalysis, Routine w reflex microscopic Urine, Clean Catch     Status: Abnormal   Collection Time: 03/31/22  1:13 AM  Result Value Ref Range   Color, Urine YELLOW YELLOW   APPearance CLEAR CLEAR   Specific Gravity, Urine 1.029 1.005 - 1.030   pH 5.5 5.0 - 8.0   Glucose, UA >1,000 (A) NEGATIVE mg/dL   Hgb urine dipstick SMALL (A) NEGATIVE   Bilirubin Urine NEGATIVE NEGATIVE   Ketones, ur 15 (A) NEGATIVE mg/dL   Protein, ur TRACE (A) NEGATIVE mg/dL   Nitrite NEGATIVE NEGATIVE   Leukocytes,Ua NEGATIVE NEGATIVE   RBC / HPF 11-20 0 - 5 RBC/hpf   WBC, UA 6-10 0 - 5 WBC/hpf   Bacteria, UA RARE (A) NONE SEEN   Squamous Epithelial / LPF 6-10 0 - 5   Mucus PRESENT    Hyaline Casts, UA PRESENT   Rapid urine drug screen (hospital performed)     Status: Abnormal   Collection Time: 03/31/22  1:13 AM  Result Value Ref Range   Opiates POSITIVE (A) NONE DETECTED   Cocaine NONE DETECTED NONE DETECTED   Benzodiazepines NONE DETECTED NONE DETECTED   Amphetamines NONE DETECTED NONE DETECTED   Tetrahydrocannabinol NONE DETECTED NONE DETECTED   Barbiturates NONE DETECTED NONE DETECTED  CBG monitoring, ED     Status: Abnormal   Collection Time: 03/31/22  2:14 AM  Result Value Ref Range   Glucose-Capillary 315 (H) 70 - 99 mg/dL   *Note: Due to a large number of results and/or encounters for the requested time period, some results have not been displayed. A complete set of results can be found in Results Review.   Imaging Studies: CT ABDOMEN PELVIS W CONTRAST  Result Date: 03/31/2022 CLINICAL DATA:  Generalized abdominal pain, low back pain EXAM: CT ABDOMEN AND PELVIS WITH CONTRAST TECHNIQUE: Multidetector CT imaging of the abdomen and pelvis was performed using the standard protocol following bolus administration of intravenous contrast. RADIATION DOSE REDUCTION: This exam was performed  according to the departmental dose-optimization program which includes automated exposure control, adjustment of the mA and/or kV according to patient size and/or use of iterative reconstruction technique. CONTRAST:  184m OMNIPAQUE IOHEXOL 300 MG/ML  SOLN COMPARISON:  01/19/2022 FINDINGS: Lower chest: No acute abnormality Hepatobiliary: No focal hepatic abnormality. Gallbladder unremarkable. Pancreas: No focal abnormality or  ductal dilatation. Spleen: No focal abnormality.  Normal size. Adrenals/Urinary Tract: No adrenal abnormality. No focal renal abnormality. No stones or hydronephrosis. Urinary bladder is unremarkable. Stomach/Bowel: Normal appendix. Stomach, large and small bowel grossly unremarkable. Vascular/Lymphatic: No evidence of aneurysm or adenopathy. Reproductive: Uterus and adnexa unremarkable.  No mass. Other: No free fluid or free air. Musculoskeletal: No acute bony abnormality. IMPRESSION: No acute findings in the abdomen or pelvis. Electronically Signed   By: Rolm Baptise M.D.   On: 03/31/2022 01:59    ED COURSE and MDM  Nursing notes, initial and subsequent vitals signs, including pulse oximetry, reviewed and interpreted by myself.  Vitals:   03/31/22 0045 03/31/22 0115 03/31/22 0130 03/31/22 0145  BP: (!) 171/82 (!) 167/76 (!) 171/79 (!) 191/83  Pulse: 89 80 79 80  Resp: _0 Temp:      TempSrc:      SpO2: 96% 95% 96% 97%   Medications  sodium chloride 0.9 % bolus 1,000 mL (0 mLs Intravenous Stopped 03/31/22 0109)  haloperidol lactate (HALDOL) injection 5 mg (5 mg Intravenous Given 03/30/22 2346)  HYDROmorphone (DILAUDID) injection 1 mg (1 mg Intravenous Given 03/30/22 2344)  sodium chloride 0.9 % bolus 1,000 mL (0 mLs Intravenous Stopped 03/31/22 0209)  insulin aspart (novoLOG) injection 10 Units (10 Units Subcutaneous Given 03/31/22 0041)  ondansetron (ZOFRAN) injection 4 mg (4 mg Intravenous Given 03/31/22 0109)  HYDROmorphone (DILAUDID) injection 1 mg (1 mg Intravenous  Given 03/31/22 0109)  iohexol (OMNIPAQUE) 300 MG/ML solution 100 mL (100 mLs Intravenous Contrast Given 03/31/22 0150)   2:06 AM Pain and nausea significantly improved.  Patient advised of reassuring CT and lab findings.  She would like a prescription for Haldol for nausea at home as this seems to work better than other antiemetics she has tried.  She has been prescribed this before.  Her drug screen is negative for cannabis.  As noted above she has an adequate supply of analgesics at home.  Patient also given insulin 10 units subcu for hyperglycemia.  The hyperglycemia may have been exacerbated by her pain episode.   PROCEDURES  Procedures   ED DIAGNOSES     ICD-10-CM   1. Chronic abdominal pain  R10.9    G89.29     2. Hyperglycemia  R73.9          Ovie Cornelio, Jenny Reichmann, MD 03/31/22 [id number]    Shanon Rosser, MD 03/31/22 [phone]

## 2022-03-31 ENCOUNTER — Other Ambulatory Visit: Payer: Self-pay

## 2022-03-31 ENCOUNTER — Other Ambulatory Visit: Payer: Self-pay | Admitting: Physical Medicine and Rehabilitation

## 2022-03-31 ENCOUNTER — Emergency Department (HOSPITAL_BASED_OUTPATIENT_CLINIC_OR_DEPARTMENT_OTHER): Payer: Medicare Other

## 2022-03-31 ENCOUNTER — Telehealth: Payer: Self-pay | Admitting: *Deleted

## 2022-03-31 ENCOUNTER — Encounter: Payer: Self-pay | Admitting: Physical Medicine and Rehabilitation

## 2022-03-31 LAB — URINALYSIS, ROUTINE W REFLEX MICROSCOPIC
Bilirubin Urine: NEGATIVE
Glucose, UA: >1000 mg/dL — AB
Ketones, ur: 15 mg/dL — AB
Leukocytes,Ua: NEGATIVE
Nitrite: NEGATIVE
Specific Gravity, Urine: 1.029 (ref 1.005–1.030)
pH: 5.5 (ref 5.0–8.0)

## 2022-03-31 LAB — RAPID URINE DRUG SCREEN, HOSP PERFORMED
Amphetamines: NOT DETECTED
Barbiturates: NOT DETECTED
Benzodiazepines: NOT DETECTED
Cocaine: NOT DETECTED
Opiates: POSITIVE — AB
Tetrahydrocannabinol: NOT DETECTED

## 2022-03-31 LAB — CBG MONITORING, ED
Glucose-Capillary: 315 mg/dL — ABNORMAL HIGH (ref 70–99)
Glucose-Capillary: 414 mg/dL — ABNORMAL HIGH (ref 70–99)

## 2022-03-31 LAB — HCG, SERUM, QUALITATIVE: Preg, Serum: NEGATIVE

## 2022-03-31 MED ORDER — OXYCODONE-ACETAMINOPHEN 10-325 MG PO TABS: 1.0000 | ORAL_TABLET | Freq: Every day | ORAL | 0 refills | Status: DC | PRN

## 2022-03-31 MED ORDER — OXYCODONE-ACETAMINOPHEN 10-325 MG PO TABS
1.0000 | ORAL_TABLET | Freq: Every day | ORAL | 0 refills | Status: DC | PRN
  Filled 2022-03-31: qty 150, 30d supply, fill #0

## 2022-03-31 MED ORDER — IOHEXOL 300 MG/ML  SOLN
100.0000 mL | Freq: Once | INTRAMUSCULAR | Status: AC | PRN
  Administered 2022-03-31: 100 mL via INTRAVENOUS

## 2022-03-31 MED ORDER — HYDROMORPHONE HCL 1 MG/ML IJ SOLN
1.0000 mg | Freq: Once | INTRAMUSCULAR | Status: AC
  Administered 2022-03-31: 1 mg via INTRAVENOUS
  Filled 2022-03-31: qty 1

## 2022-03-31 MED ORDER — ONDANSETRON HCL 4 MG/2ML IJ SOLN
4.0000 mg | Freq: Once | INTRAMUSCULAR | Status: AC
  Administered 2022-03-31: 4 mg via INTRAVENOUS
  Filled 2022-03-31: qty 2

## 2022-03-31 MED ORDER — HALOPERIDOL 5 MG PO TABS
5.0000 mg | ORAL_TABLET | Freq: Three times a day (TID) | ORAL | 0 refills | Status: DC | PRN
Start: 2022-03-31 — End: 2023-02-07

## 2022-03-31 NOTE — Telephone Encounter (Signed)
Please send to [facility] on [address]  in [facility] for her oxycodone 10/325 #150.

## 2022-03-31 NOTE — ED Notes (Signed)
Pt agreeable with d/c plan as discussed by provider- this nurse has verbally reinforced d/c instructions and provided pt with written copy- pt acknowledges verbal understanding and denies any additional questions, concerns, needs- escorted to ED waiting room via to await taxi service ([employer] has been contacted) - vitals within baseline - no acute distress

## 2022-03-31 NOTE — ED Notes (Signed)
Pt to and from CT dept via stretcher; pt now back in room awake and alert; no acute changes with provider at bedside

## 2022-04-01 ENCOUNTER — Encounter: Payer: Self-pay | Admitting: Physical Medicine and Rehabilitation

## 2022-04-03 ENCOUNTER — Ambulatory Visit: Payer: Self-pay | Admitting: Nurse Practitioner

## 2022-04-03 ENCOUNTER — Other Ambulatory Visit: Payer: Self-pay

## 2022-04-03 ENCOUNTER — Other Ambulatory Visit: Payer: Self-pay | Admitting: Physical Medicine and Rehabilitation

## 2022-04-03 MED ORDER — OXYCODONE-ACETAMINOPHEN 10-325 MG PO TABS
1.0000 | ORAL_TABLET | Freq: Every day | ORAL | 0 refills | Status: DC | PRN
  Filled 2022-04-03: qty 150, 32d supply, fill #0

## 2022-04-05 ENCOUNTER — Encounter (HOSPITAL_COMMUNITY): Payer: Self-pay | Admitting: Radiology

## 2022-04-05 NOTE — Progress Notes (Signed)
Just an FYI. We have made several attempts to contact this patient including sending a letter to schedule or reschedule their echocardiogram. We will be removing the patient from the echo WQ.   Thank you   MAILED LETTER [facility]  03/22/22 [facility] to schedule x 3 @ 9:22/[facility]  03/16/22 [facility] to schedule @ 12:02/[facility]  03/14/22 [facility] to schedule @ 9:31/[facility]  NO PA# needed CE  03/08/22 Inbasket sent for PA#

## 2022-04-07 ENCOUNTER — Other Ambulatory Visit: Payer: Self-pay

## 2022-04-07 DIAGNOSIS — M62838 Other muscle spasm: Secondary | ICD-10-CM

## 2022-04-07 MED ORDER — TIZANIDINE HCL 6 MG PO CAPS: 6.0000 mg | ORAL_CAPSULE | Freq: Three times a day (TID) | ORAL | 0 refills | Status: DC

## 2022-04-10 ENCOUNTER — Other Ambulatory Visit: Payer: Self-pay | Admitting: Internal Medicine

## 2022-04-19 ENCOUNTER — Other Ambulatory Visit: Payer: Self-pay | Admitting: Nurse Practitioner

## 2022-04-19 ENCOUNTER — Encounter: Payer: Self-pay | Admitting: Internal Medicine

## 2022-04-19 DIAGNOSIS — G8929 Other chronic pain: Secondary | ICD-10-CM

## 2022-04-20 NOTE — Telephone Encounter (Signed)
Patient would need to be seen in the office to discuss possible long-term applications of haloperidol if I write this for intermittent nausea JMP

## 2022-04-21 ENCOUNTER — Other Ambulatory Visit: Payer: Self-pay | Admitting: Lactation Services

## 2022-04-21 MED ORDER — NORETHINDRONE ACETATE 5 MG PO TABS: 10.0000 mg | ORAL_TABLET | Freq: Two times a day (BID) | ORAL | 3 refills | Status: DC | PRN

## 2022-04-21 NOTE — Progress Notes (Signed)
Verbal order from Dr. [name] to refill for Aygestin to take 10 mg BID for 3-4 days with heavy vaginal bleeding.

## 2022-04-26 ENCOUNTER — Other Ambulatory Visit: Payer: Self-pay

## 2022-04-26 DIAGNOSIS — M62838 Other muscle spasm: Secondary | ICD-10-CM

## 2022-04-26 MED ORDER — TIZANIDINE HCL 6 MG PO CAPS: 6.0000 mg | ORAL_CAPSULE | Freq: Three times a day (TID) | ORAL | 0 refills | Status: AC

## 2022-05-01 ENCOUNTER — Emergency Department (HOSPITAL_COMMUNITY)
Admission: EM | Admit: 2022-05-01 | Discharge: 2022-05-02 | Disposition: A | Discharge status: Home / Self Care | Payer: Medicare Other | Attending: Emergency Medicine | Admitting: Emergency Medicine

## 2022-05-01 ENCOUNTER — Other Ambulatory Visit: Payer: Self-pay

## 2022-05-01 ENCOUNTER — Encounter (HOSPITAL_COMMUNITY): Payer: Self-pay | Admitting: Emergency Medicine

## 2022-05-01 DIAGNOSIS — R1013 Epigastric pain: Secondary | ICD-10-CM | POA: Diagnosis not present

## 2022-05-01 DIAGNOSIS — K5900 Constipation, unspecified: Secondary | ICD-10-CM | POA: Diagnosis not present

## 2022-05-01 DIAGNOSIS — E1110 Type 2 diabetes mellitus with ketoacidosis without coma: Secondary | ICD-10-CM | POA: Diagnosis not present

## 2022-05-01 DIAGNOSIS — J449 Chronic obstructive pulmonary disease, unspecified: Secondary | ICD-10-CM | POA: Diagnosis not present

## 2022-05-01 DIAGNOSIS — I110 Hypertensive heart disease with heart failure: Secondary | ICD-10-CM | POA: Diagnosis not present

## 2022-05-01 DIAGNOSIS — R112 Nausea with vomiting, unspecified: Secondary | ICD-10-CM | POA: Diagnosis present

## 2022-05-01 DIAGNOSIS — I5032 Chronic diastolic (congestive) heart failure: Secondary | ICD-10-CM | POA: Insufficient documentation

## 2022-05-01 DIAGNOSIS — Z5321 Procedure and treatment not carried out due to patient leaving prior to being seen by health care provider: Secondary | ICD-10-CM | POA: Insufficient documentation

## 2022-05-01 DIAGNOSIS — K2970 Gastritis, unspecified, without bleeding: Secondary | ICD-10-CM | POA: Insufficient documentation

## 2022-05-01 LAB — COMPREHENSIVE METABOLIC PANEL
ALT: 9 U/L (ref 0–44)
AST: 20 U/L (ref 15–41)
Albumin: 3.7 g/dL (ref 3.5–5.0)
Alkaline Phosphatase: 94 U/L (ref 38–126)
Anion gap: 12 (ref 5–15)
BUN: 10 mg/dL (ref 6–20)
CO2: 19 mmol/L — ABNORMAL LOW (ref 22–32)
Calcium: 9.3 mg/dL (ref 8.9–10.3)
Chloride: 108 mmol/L (ref 98–111)
Creatinine, Ser: 1.3 mg/dL — ABNORMAL HIGH (ref 0.44–1.00)
GFR, Estimated: 52 mL/min — ABNORMAL LOW (ref >60)
Glucose, Bld: 137 mg/dL — ABNORMAL HIGH (ref 70–99)
Potassium: 3.5 mmol/L (ref 3.5–5.1)
Sodium: 139 mmol/L (ref 135–145)
Total Bilirubin: 0.3 mg/dL (ref 0.3–1.2)
Total Protein: 6.6 g/dL (ref 6.5–8.1)

## 2022-05-01 LAB — CBC
HCT: 36.7 % (ref 36.0–46.0)
Hemoglobin: 12 g/dL (ref 12.0–15.0)
MCH: 27.9 pg (ref 26.0–34.0)
MCHC: 32.7 g/dL (ref 30.0–36.0)
MCV: 85.3 fL (ref 80.0–100.0)
Platelets: 233 10*3/uL (ref 150–400)
RBC: 4.3 MIL/uL (ref 3.87–5.11)
RDW: 17.5 % — ABNORMAL HIGH (ref 11.5–15.5)
WBC: 12 10*3/uL — ABNORMAL HIGH (ref 4.0–10.5)
nRBC: 0 % (ref 0.0–0.2)

## 2022-05-01 LAB — LIPASE, BLOOD: Lipase: 28 U/L (ref 11–51)

## 2022-05-01 LAB — I-STAT BETA HCG BLOOD, ED (MC, WL, AP ONLY): I-stat hCG, quantitative: <5 m[IU]/mL (ref <5)

## 2022-05-01 NOTE — ED Provider Triage Note (Signed)
Emergency Medicine Provider Triage Evaluation Note  Jill Shaw , a 46 y.o. female  was evaluated in triage.  Pt complains of "inflammatory gastritis".  Reports of the past 3 to 4 days that she is having nausea vomiting as well.  She reports that the medication she is taking at home has not been working.  She reports that usually give her a "shot "for her pain as well as some nausea and vomiting medications.  Patient's last CT of her abdomen was done in March 31, 2022 that was normal.  Review of Systems  Positive:  Negative:   Physical Exam  There were no vitals taken for this visit. Gen:   Patient is uncomfortable appearing, rocking back and forth Resp:  Normal effort  MSK:   Moves extremities without difficulty  Other:  Diffuse abdominal pain with voluntary guarding.  Abdomen is still soft.  Medical Decision Making  Medically screening exam initiated at 7:06 PM.  Appropriate orders placed.  Jill Shaw was informed that the remainder of the evaluation will be completed by another provider, this initial triage assessment does not replace that evaluation, and the importance of remaining in the ED until their evaluation is complete.  We will order CT abdomen pelvis with contrast as well as basic abdominal labs.   Achille Rich, [place] 05/01/22 [id number]

## 2022-05-01 NOTE — ED Triage Notes (Signed)
Pt presents with EMS from home.  Hx of "inflammatory gastritis" that flares up about every 3 months and believes that is what happening now.  Abdominal pain with n/v.

## 2022-05-01 NOTE — ED Notes (Signed)
Called pt x3 for vitals, no response. 

## 2022-05-01 NOTE — ED Notes (Signed)
X4 no response

## 2022-05-02 ENCOUNTER — Ambulatory Visit: Payer: Medicaid Other | Admitting: Registered Nurse

## 2022-05-02 ENCOUNTER — Other Ambulatory Visit: Payer: Self-pay

## 2022-05-02 ENCOUNTER — Encounter (HOSPITAL_BASED_OUTPATIENT_CLINIC_OR_DEPARTMENT_OTHER): Payer: Self-pay

## 2022-05-02 ENCOUNTER — Emergency Department (HOSPITAL_BASED_OUTPATIENT_CLINIC_OR_DEPARTMENT_OTHER)
Admission: EM | Admit: 2022-05-02 | Discharge: 2022-05-02 | Disposition: A | Discharge status: Home / Self Care | Payer: Medicare Other | Source: Home / Self Care | Attending: Emergency Medicine | Admitting: Emergency Medicine

## 2022-05-02 DIAGNOSIS — K5900 Constipation, unspecified: Secondary | ICD-10-CM | POA: Insufficient documentation

## 2022-05-02 DIAGNOSIS — R1013 Epigastric pain: Secondary | ICD-10-CM | POA: Insufficient documentation

## 2022-05-02 DIAGNOSIS — J45909 Unspecified asthma, uncomplicated: Secondary | ICD-10-CM | POA: Insufficient documentation

## 2022-05-02 DIAGNOSIS — I5032 Chronic diastolic (congestive) heart failure: Secondary | ICD-10-CM | POA: Insufficient documentation

## 2022-05-02 DIAGNOSIS — I110 Hypertensive heart disease with heart failure: Secondary | ICD-10-CM | POA: Insufficient documentation

## 2022-05-02 DIAGNOSIS — K2970 Gastritis, unspecified, without bleeding: Secondary | ICD-10-CM | POA: Diagnosis not present

## 2022-05-02 DIAGNOSIS — R112 Nausea with vomiting, unspecified: Secondary | ICD-10-CM | POA: Insufficient documentation

## 2022-05-02 DIAGNOSIS — J449 Chronic obstructive pulmonary disease, unspecified: Secondary | ICD-10-CM | POA: Insufficient documentation

## 2022-05-02 DIAGNOSIS — E1110 Type 2 diabetes mellitus with ketoacidosis without coma: Secondary | ICD-10-CM | POA: Insufficient documentation

## 2022-05-02 LAB — CBC WITH DIFFERENTIAL/PLATELET
Abs Immature Granulocytes: 0.03 10*3/uL (ref 0.00–0.07)
Basophils Absolute: 0.1 10*3/uL (ref 0.0–0.1)
Basophils Relative: 1 %
Eosinophils Absolute: 0.2 10*3/uL (ref 0.0–0.5)
Eosinophils Relative: 2 %
HCT: 37.8 % (ref 36.0–46.0)
Hemoglobin: 12.8 g/dL (ref 12.0–15.0)
Immature Granulocytes: 0 %
Lymphocytes Relative: 22 %
Lymphs Abs: 2.3 10*3/uL (ref 0.7–4.0)
MCH: 27.5 pg (ref 26.0–34.0)
MCHC: 33.9 g/dL (ref 30.0–36.0)
MCV: 81.3 fL (ref 80.0–100.0)
Monocytes Absolute: 0.6 10*3/uL (ref 0.1–1.0)
Monocytes Relative: 6 %
Neutro Abs: 7.2 10*3/uL (ref 1.7–7.7)
Neutrophils Relative %: 69 %
Platelets: 219 10*3/uL (ref 150–400)
RBC: 4.65 MIL/uL (ref 3.87–5.11)
RDW: 17.4 % — ABNORMAL HIGH (ref 11.5–15.5)
WBC: 10.4 10*3/uL (ref 4.0–10.5)
nRBC: 0 % (ref 0.0–0.2)

## 2022-05-02 LAB — COMPREHENSIVE METABOLIC PANEL
ALT: 5 U/L (ref 0–44)
AST: 14 U/L — ABNORMAL LOW (ref 15–41)
Albumin: 4.4 g/dL (ref 3.5–5.0)
Alkaline Phosphatase: 101 U/L (ref 38–126)
Anion gap: 13 (ref 5–15)
BUN: 13 mg/dL (ref 6–20)
CO2: 18 mmol/L — ABNORMAL LOW (ref 22–32)
Calcium: 9.9 mg/dL (ref 8.9–10.3)
Chloride: 109 mmol/L (ref 98–111)
Creatinine, Ser: 1.07 mg/dL — ABNORMAL HIGH (ref 0.44–1.00)
GFR, Estimated: >60 mL/min (ref >60)
Glucose, Bld: 203 mg/dL — ABNORMAL HIGH (ref 70–99)
Potassium: 3.6 mmol/L (ref 3.5–5.1)
Sodium: 140 mmol/L (ref 135–145)
Total Bilirubin: 0.4 mg/dL (ref 0.3–1.2)
Total Protein: 7.2 g/dL (ref 6.5–8.1)

## 2022-05-02 LAB — LIPASE, BLOOD: Lipase: 26 U/L (ref 11–51)

## 2022-05-02 LAB — TROPONIN I (HIGH SENSITIVITY): Troponin I (High Sensitivity): 6 ng/L (ref <18)

## 2022-05-02 MED ORDER — SODIUM CHLORIDE 0.9 % IV BOLUS
500.0000 mL | Freq: Once | INTRAVENOUS | Status: AC
  Administered 2022-05-02: 500 mL via INTRAVENOUS

## 2022-05-02 MED ORDER — ACETAMINOPHEN 325 MG PO TABS
650.0000 mg | ORAL_TABLET | Freq: Once | ORAL | Status: AC
  Administered 2022-05-02: 650 mg via ORAL
  Filled 2022-05-02: qty 2

## 2022-05-02 MED ORDER — DROPERIDOL 2.5 MG/ML IJ SOLN
1.2500 mg | Freq: Once | INTRAMUSCULAR | Status: AC
Start: 2022-05-02 — End: 2022-05-02
  Administered 2022-05-02: 1.25 mg via INTRAVENOUS
  Filled 2022-05-02: qty 2

## 2022-05-02 MED ORDER — ONDANSETRON HCL 4 MG/2ML IJ SOLN
4.0000 mg | Freq: Once | INTRAMUSCULAR | Status: AC
  Administered 2022-05-02: 4 mg via INTRAVENOUS
  Filled 2022-05-02: qty 2

## 2022-05-02 MED ORDER — HYDROMORPHONE HCL 1 MG/ML IJ SOLN
1.0000 mg | Freq: Once | INTRAMUSCULAR | Status: AC
  Administered 2022-05-02: 1 mg via INTRAVENOUS
  Filled 2022-05-02: qty 1

## 2022-05-02 MED ORDER — DICYCLOMINE HCL 10 MG/ML IM SOLN
20.0000 mg | Freq: Once | INTRAMUSCULAR | Status: AC
  Administered 2022-05-02: 20 mg via INTRAMUSCULAR
  Filled 2022-05-02: qty 2

## 2022-05-02 NOTE — ED Triage Notes (Signed)
Patient here POV from Home  Endorses Generalized ABD Pain associated with N/V that began 3 Days ago.   Was at another ED Yesterday and had Lab Specimens collected but LWBS due to Wait.  No Diarrhea. Mild Nausea Now with No Emesis. No Fevers.   Uncomfortable during Triage. A&OX4. GCS 15. Ambulatory.

## 2022-05-02 NOTE — ED Notes (Signed)
Pt. Has had something to drink and tolerated it well.

## 2022-05-02 NOTE — Discharge Instructions (Addendum)
Follow-up with your primary care doctor.  Return to the emergency room if you have any worsening symptoms

## 2022-05-02 NOTE — ED Provider Notes (Signed)
Emergency Department Provider Note   I have reviewed the triage vital signs and the nursing notes.   HISTORY  Chief Complaint Abdominal Pain   HPI Jill Shaw is a 46 y.o. female presents to the emergency department for evaluation of acute on chronic abdominal pain.  Patient states that she has gastritis that flares from time to time.  She states this pain she is currently having is severe but typical of her gastritis flare episodes.  She cannot think of any new or worsening qualities.  She feels nausea with vomiting.  She has had some associated constipation but continues to pass flatus and have small bowel movements.  No blood in the emesis.  No chest discomfort or shortness of breath.  She is been taking her home medications which include oxycodone for her lower back but symptoms are not improving.  She has not had fevers or chills.  She is compliant with her other home medications including her diabetes meds.  Denies marijuana use or regular drinking.  Past Medical History:  Diagnosis Date   Anemia    Anxiety    Arthritis    knees, hands   Asthma    Chronic diastolic (congestive) heart failure (HCC)    COPD (chronic obstructive pulmonary disease) (HCC)    Diabetes mellitus (HCC)    DKA (diabetic ketoacidosis) (HCC)    Dysfunctional uterine bleeding    Gastritis    GERD (gastroesophageal reflux disease)    Hypertension    Neuromuscular disorder (HCC)    neuropathy feet   Seizures (HCC) 09/12/2017   pt states r/t stress and blood sugar - no meds last one 4 months ago, not seen neurologist   Sickle cell trait (HCC)    Smoker    Vitamin D deficiency 10/2019   Wears glasses     Review of Systems  Constitutional: No fever/chills Eyes: No visual changes. ENT: No sore throat. Cardiovascular: Denies chest pain. Respiratory: Denies shortness of breath. Gastrointestinal: Positive epigastric abdominal pain. Positive nausea and vomiting.  No diarrhea.  No  constipation. Genitourinary: Negative for dysuria. Musculoskeletal: Negative for back pain. Skin: Negative for rash. Neurological: Negative for headaches, focal weakness or numbness.   ____________________________________________   PHYSICAL EXAM:  VITAL SIGNS: ED Triage Vitals  Enc Vitals Group     BP 05/02/22 1330 (!) 182/88     Pulse Rate 05/02/22 1330 (!) 108     Resp 05/02/22 1330 20     Temp 05/02/22 1330 97.8 F (36.6 C)     Temp src --      SpO2 05/02/22 1330 100 %     Weight 05/02/22 1331 278 lb 14.1 oz (126.5 kg)     Height 05/02/22 1331 5\' 4"  (1.626 m)   Constitutional: Alert and oriented. Patient appears uncomfortable but able to provide a full history.  Eyes: Conjunctivae are normal. Head: Atraumatic. Nose: No congestion/rhinnorhea. Mouth/Throat: Mucous membranes are moist.  Neck: No stridor.   Cardiovascular: Tachycardia. Good peripheral circulation. Grossly normal heart sounds.   Respiratory: Normal respiratory effort.  No retractions. Lungs CTAB. Gastrointestinal: Soft with mild diffuse tenderness. No peritonitis. No distention.  Musculoskeletal: No gross deformities of extremities. Neurologic:  Normal speech and language.  Skin:  Skin is warm, dry and intact. No rash noted.  ____________________________________________   LABS (all labs ordered are listed, but only abnormal results are displayed)  Labs Reviewed  COMPREHENSIVE METABOLIC PANEL - Abnormal; Notable for the following components:      Result  Value   CO2 18 (*)    Glucose, Bld 203 (*)    Creatinine, Ser 1.07 (*)    AST 14 (*)    All other components within normal limits  CBC WITH DIFFERENTIAL/PLATELET - Abnormal; Notable for the following components:   RDW 17.4 (*)    All other components within normal limits  LIPASE, BLOOD  TROPONIN I (HIGH SENSITIVITY)    ____________________________________________   PROCEDURES  Procedure(s) performed:   Procedures  None   ____________________________________________   INITIAL IMPRESSION / ASSESSMENT AND PLAN / ED COURSE  Pertinent labs & imaging results that were available during my care of the patient were reviewed by me and considered in my medical decision making (see chart for details).   This patient is Presenting for Evaluation of abdominal pain, which does require a range of treatment options, and is a complaint that involves a high risk of morbidity and mortality.  The Differential Diagnoses includes but is not exclusive to acute cholecystitis, intrathoracic causes for epigastric abdominal pain, gastritis, duodenitis, pancreatitis, small bowel or large bowel obstruction, abdominal aortic aneurysm, hernia, gastritis, etc.   Critical Interventions-    Medications  sodium chloride 0.9 % bolus 500 mL (0 mLs Intravenous Stopped 05/02/22 1650)  HYDROmorphone (DILAUDID) injection 1 mg (1 mg Intravenous Given 05/02/22 1412)  ondansetron (ZOFRAN) injection 4 mg (4 mg Intravenous Given 05/02/22 1411)  droperidol (INAPSINE) 2.5 MG/ML injection 1.25 mg (1.25 mg Intravenous Given 05/02/22 1503)  dicyclomine (BENTYL) injection 20 mg (20 mg Intramuscular Given 05/02/22 1731)  acetaminophen (TYLENOL) tablet 650 mg (650 mg Oral Given 05/02/22 1734)    Reassessment after intervention:   I decided to review pertinent External Data, and in summary last ED visit was June of this year for a similar complaint.  CT scan at that time reassuring.  I did independently evaluate that scan and agree with radiology interpretation.  Patient had an MSE exam yesterday but ultimately eloped prior to full evaluation.    Clinical Laboratory Tests Ordered, included CBC without anemia, No AKI. Normal electrolytes.   Radiologic Tests: Considered CT imaging of the abdomen and pelvis but presentation seems consistent to prior ED evaluations for chronic abdominal pain.  Patient agrees with this assessment and states that the pain feels similar  to her chronic abdominal pain.  Defer imaging for now.   Cardiac Monitor Tracing which shows NSR.   Social Determinants of Health Risk patient is a smoker. Denies marijuana use.    Medical Decision Making: Summary:  Patient presents emergency department with abdominal pain typical of her chronic abdominal discomfort and gastritis.  Defer imaging for now but will obtain screening blood work and treat pain/nausea along with IV fluids and reassess.  Reevaluation with update and discussion with patient feeling somewhat improved. Will transfer care to Dr. Fredderick Phenix pending symptom resolution.   Disposition: pending  ____________________________________________  FINAL CLINICAL IMPRESSION(S) / ED DIAGNOSES  Final diagnoses:  Epigastric pain     Note:  This document was prepared using Dragon voice recognition software and may include unintentional dictation errors.  Alona Bene, MD, [facility] Emergency Medicine    Amando Chaput, Arlyss Repress, MD 05/03/22 [phone]

## 2022-05-02 NOTE — ED Provider Notes (Signed)
Care was taken over from Dr. Jacqulyn Bath.  Patient has a history of recurrent abdominal pain related to gastritis.  Her symptoms are consistent with her prior episodes.  Her labs are reviewed and are nonconcerning.  She had recent imaging in June and given her chronicity of symptoms, I do not feel that further imaging is indicated.  She is symptomatically better after treatment in the ED.  She was able to drink without ongoing vomiting.  She was discharged home in good condition.  Return precautions were given.  She has home medications to use.   Rolan Bucco, MD 05/02/22 [phone]

## 2022-05-03 ENCOUNTER — Other Ambulatory Visit: Payer: Self-pay

## 2022-05-03 ENCOUNTER — Ambulatory Visit: Payer: Medicare Other | Admitting: Cardiology

## 2022-05-03 ENCOUNTER — Encounter: Payer: Medicare Other | Attending: Physical Medicine and Rehabilitation | Admitting: Registered Nurse

## 2022-05-03 ENCOUNTER — Telehealth: Payer: Self-pay | Admitting: Registered Nurse

## 2022-05-03 ENCOUNTER — Other Ambulatory Visit: Payer: Self-pay | Admitting: Nurse Practitioner

## 2022-05-03 ENCOUNTER — Telehealth: Payer: Self-pay

## 2022-05-03 ENCOUNTER — Encounter: Payer: Self-pay | Admitting: Registered Nurse

## 2022-05-03 VITALS — BP 128/74 | HR 100 | Ht 64.0 in | Wt 278.0 lb

## 2022-05-03 DIAGNOSIS — G894 Chronic pain syndrome: Secondary | ICD-10-CM | POA: Diagnosis present

## 2022-05-03 DIAGNOSIS — G629 Polyneuropathy, unspecified: Secondary | ICD-10-CM | POA: Diagnosis present

## 2022-05-03 DIAGNOSIS — M170 Bilateral primary osteoarthritis of knee: Secondary | ICD-10-CM

## 2022-05-03 DIAGNOSIS — M25512 Pain in left shoulder: Secondary | ICD-10-CM

## 2022-05-03 DIAGNOSIS — M5416 Radiculopathy, lumbar region: Secondary | ICD-10-CM | POA: Diagnosis present

## 2022-05-03 DIAGNOSIS — Z5181 Encounter for therapeutic drug level monitoring: Secondary | ICD-10-CM

## 2022-05-03 DIAGNOSIS — M25511 Pain in right shoulder: Secondary | ICD-10-CM | POA: Diagnosis present

## 2022-05-03 DIAGNOSIS — Z79891 Long term (current) use of opiate analgesic: Secondary | ICD-10-CM | POA: Diagnosis present

## 2022-05-03 MED ORDER — OXYCODONE-ACETAMINOPHEN 10-325 MG PO TABS
1.0000 | ORAL_TABLET | Freq: Every day | ORAL | 0 refills | Status: DC | PRN
Start: 2022-05-03 — End: 2022-05-03

## 2022-05-03 MED ORDER — OXYCODONE-ACETAMINOPHEN 10-325 MG PO TABS
1.0000 | ORAL_TABLET | Freq: Every day | ORAL | 0 refills | Status: DC | PRN
  Filled 2022-05-03: qty 150, 30d supply, fill #0

## 2022-05-03 NOTE — Telephone Encounter (Signed)
Patient called states PA was faxed over on her medication

## 2022-05-03 NOTE — Progress Notes (Signed)
Subjective:    Patient ID: Jill Shaw, female    DOB: 12-14-1975, 46 y.o.   MRN: [id number]  HPI: Jill Shaw is a 46 y.o. female who returns for follow up appointment for chronic pain and medication refill. She states her pain is located in her bilateral shoulders, lower back pain radiating into her bilateral lower extremities and bilateral knee pain. She also reports bilatteral feet pain with tingling and burning. She rates her pain 10. Her current exercise regime is walking and performing stretching exercises.  [name] Morphine equivalent is 70.51 MME.   UDS ordered today.      Pain Inventory Average Pain 10 Pain Right Now 10 My pain is constant, sharp, burning, dull, stabbing, tingling, and aching  In the last 24 hours, has pain interfered with the following? General activity 10 Relation with others 0 Enjoyment of life 0 What TIME of day is your pain at its worst? morning , daytime, evening, and night Sleep (in general) Poor  Pain is worse with: walking, bending, sitting, inactivity, standing, unsure, and some activites Pain improves with: heat/ice and medication Relief from Meds: 10  Family History  Problem Relation Age of Onset   Diabetes Mother    Hypertension Mother    Migraines Paternal Grandfather    Colon cancer Neg Hx    Esophageal cancer Neg Hx    Stomach cancer Neg Hx    Rectal cancer Neg Hx    Social History   Socioeconomic History   Marital status: Legally Separated    Spouse name: Not on file   Number of children: 3   Years of education: Not on file   Highest education level: Not on file  Occupational History   Occupation: unemployed  Tobacco Use   Smoking status: Some Days    Packs/day: 0.25    Years: 26.00    Total pack years: 6.50    Types: Cigarettes    Last attempt to quit: 09/13/2020    Years since quitting: 1.6   Smokeless tobacco: Never   Tobacco comments:    4-5 cigarettes/day  Vaping Use   Vaping Use: Never used   Substance and Sexual Activity   Alcohol use: No   Drug use: No   Sexual activity: Not Currently    Birth control/protection: None  Other Topics Concern   Not on file  Social History Narrative   Right Handed   Lives in a one story apartment, but lives on the second floor   Drinks caffeine once in awhile   Social Determinants of Health   Financial Resource Strain: Not on file  Food Insecurity: Food Insecurity Present (12/08/2021)   Hunger Vital Sign    Worried About Running Out of Food in the Last Year: Never true    Ran Out of Food in the Last Year: Sometimes true  Transportation Needs: No Transportation Needs (12/08/2021)   PRAPARE - Administrator, Civil Service (Medical): No    Lack of Transportation (Non-Medical): No  Physical Activity: Not on file  Stress: Not on file  Social Connections: Not on file   Past Surgical History:  Procedure Laterality Date   CESAREAN SECTION     x 1. for twins   DILATION AND CURETTAGE OF UTERUS N/A 08/20/2019   Procedure: DILATATION AND CURETTAGE;  Surgeon: Allie Bossier, MD;  Location: [facility] OR;  Service: Gynecology;  Laterality: N/A;   ENDOMETRIAL ABLATION N/A 08/20/2019   Procedure: Minerva Ablation;  Surgeon:  Allie Bossier, MD;  Location: [facility] OR;  Service: Gynecology;  Laterality: N/A;   EYE SURGERY Bilateral    laser right and cataract removed left eye   LEFT HEART CATH AND CORONARY ANGIOGRAPHY N/A 08/30/2021   Procedure: LEFT HEART CATH AND CORONARY ANGIOGRAPHY;  Surgeon: Elder Negus, MD;  Location: [facility] INVASIVE CV LAB;  Service: Cardiovascular;  Laterality: N/A;   RADIOLOGY WITH ANESTHESIA N/A 09/16/2019   Procedure: MRI WITH ANESTHESIA   L SPINE WITHOUT CONTRAST, T SPINE WITHOUT CONTRAST , CERVICAL WITHOUT CONTRAST;  Surgeon: Radiologist, Medication, MD;  Location: [facility] OR;  Service: Radiology;  Laterality: N/A;   TUBAL LIGATION     interval BTL   UPPER GI ENDOSCOPY  07/2017   Past Surgical History:  Procedure Laterality  Date   CESAREAN SECTION     x 1. for twins   DILATION AND CURETTAGE OF UTERUS N/A 08/20/2019   Procedure: DILATATION AND CURETTAGE;  Surgeon: Allie Bossier, MD;  Location: [facility] OR;  Service: Gynecology;  Laterality: N/A;   ENDOMETRIAL ABLATION N/A 08/20/2019   Procedure: Minerva Ablation;  Surgeon: Allie Bossier, MD;  Location: [facility] OR;  Service: Gynecology;  Laterality: N/A;   EYE SURGERY Bilateral    laser right and cataract removed left eye   LEFT HEART CATH AND CORONARY ANGIOGRAPHY N/A 08/30/2021   Procedure: LEFT HEART CATH AND CORONARY ANGIOGRAPHY;  Surgeon: Elder Negus, MD;  Location: [facility];  Service: Cardiovascular;  Laterality: N/A;   RADIOLOGY WITH ANESTHESIA N/A 09/16/2019   Procedure: MRI WITH ANESTHESIA   L SPINE WITHOUT CONTRAST, T SPINE WITHOUT CONTRAST , CERVICAL WITHOUT CONTRAST;  Surgeon: Radiologist, Medication, MD;  Location: [facility] OR;  Service: Radiology;  Laterality: N/A;   TUBAL LIGATION     interval BTL   UPPER GI ENDOSCOPY  07/2017   Past Medical History:  Diagnosis Date   Anemia    Anxiety    Arthritis    knees, hands   Asthma    Chronic diastolic (congestive) heart failure (HCC)    COPD (chronic obstructive pulmonary disease) (HCC)    Diabetes mellitus (HCC)    DKA (diabetic ketoacidosis) (HCC)    Dysfunctional uterine bleeding    Gastritis    GERD (gastroesophageal reflux disease)    Hypertension    Neuromuscular disorder (HCC)    neuropathy feet   Seizures (HCC) 09/12/2017   pt states r/t stress and blood sugar - no meds last one 4 months ago, not seen neurologist   Sickle cell trait (HCC)    Smoker    Vitamin D deficiency 10/2019   Wears glasses    BP (!) 166/96   Pulse 100   Ht 5\' 4"  (1.626 m)   Wt 278 lb (126.1 kg)   SpO2 97%   BMI 47.72 kg/m   Opioid Risk Score:   Fall Risk Score:  `1  Depression screen PHQ 2/9     05/03/2022    1:52 PM 03/17/2022    2:08 PM 03/03/2022    1:30 PM 01/03/2022   10:52 AM 12/08/2021   10:50  AM 11/11/2021    2:50 PM 10/11/2021    9:57 AM  Depression screen PHQ 2/9  Decreased Interest 0 0 1 3 2 1 1   Down, Depressed, Hopeless 0 0 1 2 2 1    PHQ - 2 Score 0 0 2 5 4 2 1   Altered sleeping  0   2    Tired, decreased energy  0   2    Change in appetite  0   0    Feeling bad or failure about yourself   0       Trouble concentrating  0   2    Moving slowly or fidgety/restless  0   0    Suicidal thoughts  0   0    PHQ-9 Score  0   10      Review of Systems  Musculoskeletal:  Positive for back pain.       PAIN IN ALL MAJOR JOINTS, BOTH FEET, BOTH HANDS, BOTH SHOULDERS  All other systems reviewed and are negative.      Objective:   Physical Exam Vitals and nursing note reviewed.  Constitutional:      Appearance: Normal appearance.  Cardiovascular:     Rate and Rhythm: Regular rhythm.     Pulses: Normal pulses.     Heart sounds: Normal heart sounds.  Pulmonary:     Effort: Pulmonary effort is normal.     Breath sounds: Normal breath sounds.  Musculoskeletal:     Cervical back: Normal range of motion and neck supple.     Comments: Normal Muscle Bulk and Muscle Testing Reveals:  Upper Extremities: Full ROM and Muscle Strength 5/5 Bilateral AC Joint Tenderness  Lumbar Hypersensitivity Lower Extremities: Full ROM and Muscle Strength 5/5 Arises from Table slowly Narrow Based  Gait     Skin:    General: Skin is warm and dry.  Neurological:     Mental Status: She is alert and oriented to person, place, and time.  Psychiatric:        Mood and Affect: Mood normal.        Behavior: Behavior normal.         Assessment & Plan:  1. Lumbar Radiculitis: Continue current medication regimen. Continue HEP as Tolerated.05/03/2022 2. Fibromyalgia/Polyneuropathy: Continue HEP as Tolerated. Continue current Medication regimen. Continue to Monitor. 03/03/2022 3. Bilateral Knee Pain: No complaints today. Continue to Monitor.05/03/2022 4. Chronic Pain Syndrome: Continue Hydrocodone  10/325mg  one tablet 5 times  a day as needed for pain #120. We will continue the opioid monitoring program, this consists of regular clinic visits, examinations, urine drug screen, pill counts as well as use of [place] Controlled Substance Reporting system. A 12 month History has been reviewed on the [place] Controlled Substance Reporting System on 05/03/2022.  5. Tachycardia: Apical pulse check. Ms. Skidmore will F/U with her PCP and Cardiologist. Continue to monitor.  05/03/2022  6. Fall at Home: Educated on Falls Prevention: PCP and Ortho Following per Ms. Emel. She verbalizes understanding. 05/03/2022      F/U in 1 month

## 2022-05-03 NOTE — Patient Outreach (Signed)
Care Coordination  05/03/2022  CORRY STORIE 1975/12/10 [id number]  Transition Care Management Unsuccessful Follow-up Telephone Call  Date of discharge and from where:  05/02/22 [facility]  Attempts:  1st Attempt  Reason for unsuccessful TCM follow-up call:  Left voice message

## 2022-05-04 ENCOUNTER — Encounter: Payer: Medicare Other | Admitting: Registered Nurse

## 2022-05-04 ENCOUNTER — Ambulatory Visit: Payer: Medicaid Other | Admitting: Nurse Practitioner

## 2022-05-08 ENCOUNTER — Telehealth: Payer: Self-pay | Admitting: Licensed Clinical Social Worker

## 2022-05-08 NOTE — Patient Outreach (Signed)
[facility]) Care Management  05/08/2022  Jill Shaw [birth year] [id number]  Transition Care Management Unsuccessful Follow-up Telephone Call  Date of discharge and from where:  05/02/22 from [facility].   Attempts:  2nd Attempt  Reason for unsuccessful TCM follow-up call:  Left voice message  [name], BSW, MSW, [name] LCSW [facility]  [facility] [name] Phone: [phone]

## 2022-05-09 ENCOUNTER — Telehealth: Payer: Self-pay | Admitting: Obstetrics and Gynecology

## 2022-05-09 NOTE — Patient Outreach (Signed)
Transition Care Management Follow-up Telephone Call Date of discharge and from where: 04/22/22 How have you been since you were released from the hospital? Doing better Any questions or concerns? Yes.  Patient states she needs a certain pain medication injected twice when she presents to the ED with abdominal pain-starts with a "D"-does not know specific name-states this works the best for her pain-? Demerol?  Items Reviewed: Did the pt receive and understand the discharge instructions provided? Yes  Medications obtained and verified? Yes  Other? No  Any new allergies since your discharge? No  Dietary orders reviewed? Yes Do you have support at home? Yes   Home Care and Equipment/Supplies: Were home health services ordered? not applicable If so, what is the name of the agency? N/A  Has the agency set up a time to come to the patient's home? not applicable Were any new equipment or medical supplies ordered?  No What is the name of the medical supply agency? N/A Were you able to get the supplies/equipment? not applicable Do you have any questions related to the use of the equipment or supplies? No  Functional Questionnaire: (I = Independent and D = Dependent) ADLs: I  Bathing/Dressing- I  Meal Prep- I  Eating- I  Maintaining continence- I  Transferring/Ambulation- I  Managing Meds- I  Follow up appointments reviewed:  PCP Hospital f/u appt confirmed? Yes  Scheduled to see Mirian Capuchin on 05/17/22 @ 11.Mar[facility] Hospital f/u appt confirmed? No   Are transportation arrangements needed? No  If their condition worsens, is the pt aware to call PCP or go to the Emergency Dept.? Yes Was the patient provided with contact information for the PCP's office or ED? Yes Was to pt encouraged to call back with questions or concerns? Yes

## 2022-05-10 ENCOUNTER — Other Ambulatory Visit: Payer: Self-pay | Admitting: Pharmacist

## 2022-05-10 ENCOUNTER — Other Ambulatory Visit: Payer: Self-pay | Admitting: Cardiology

## 2022-05-10 DIAGNOSIS — I10 Essential (primary) hypertension: Secondary | ICD-10-CM

## 2022-05-10 DIAGNOSIS — E782 Mixed hyperlipidemia: Secondary | ICD-10-CM

## 2022-05-10 DIAGNOSIS — Z794 Long term (current) use of insulin: Secondary | ICD-10-CM

## 2022-05-10 MED ORDER — TOUJEO MAX SOLOSTAR 300 UNIT/ML ~~LOC~~ SOPN: 160.0000 [IU] | PEN_INJECTOR | Freq: Every day | SUBCUTANEOUS | 2 refills | Status: DC

## 2022-05-10 NOTE — Chronic Care Management (AMB) (Signed)
Chief Complaint  Patient presents with   Hypertension    Jill Shaw is a 46 y.o. year old female who presented for a telephone visit.   They were referred to the pharmacist by their PCP for assistance in managing diabetes and hypertension.   Subjective:  Care Team: Primary Care Provider: Ivonne Andrew, NP ; Next Scheduled Visit: 06/21/22 Cardiologist: [place] CV; Next Scheduled Visit: needs to schedule Pain Management: [place]; Next Scheduled Visit: 05/31/22 with PA  Medication Access/Adherence  Current Pharmacy:  [id number] - [place], [place] - [address] AT [facility] OF [address] [place] [address] Phone: [phone] Fax: [phone]   Patient reports affordability concerns with their medications: No  Patient reports access/transportation concerns to their pharmacy: Yes  Patient reports adherence concerns with their medications:  Yes  reports she wishes her tizanidine prescription had refills on it, it is difficult having to get a refill every month. Fill history also indicates some nonadherence for several medications   Diabetes:  Current medications: prescribed Jardiance 25 mg daily, Lantus 160 units daily in the morning (having to split doses), Humalog 60 units three times daily Medications tried in the past: metformin (documented hx GI upset); Victoza (documented hx GI upset, also gastroparesis at baseline)  Current glucose readings: fastings/mornings ~140s, also checks in the evenings. Not consistently pre or post prandial.  Using Accu chek meter;   Previously used DexCom, but reports irritation and difficulty keeping the sensor on due to sweat.   Denies episodes of hypoglycemia recently, except for the episode in May. She took mealtime insulin prior to appointment and the appointment took longer than anticipated.    Hyperlipidemia/ASCVD Risk Reduction  Current lipid lowering medications:  rosuvastatin 20 mg - last fill date 07/2021  Heart Failure:  Current medications:  ACEi/ARB/ARNI: losartan 100 mg daily SGLT2i: Jardiance 25 mg daily - has not been filled in the last 6 months Beta blocker: metoprolol succinate 100 mg daily- has not been filled in the past 6 months Mineralocorticoid Receptor Antagonist: spironolactone 50 mg daily - has not been filled in the last 6 months Diuretic regimen: furosemide 40 mg twice daily Additional antihypertensives: HCTZ 12.5 mg daily, isosorbide ER 30 mg daily   Current home blood pressure readings: reports they are variable; readings were ~ 120s/80s the past few days, but has been up to 140-150s    Patient denies volume overload signs or symptoms including shortness of breath, lower extremity edema, increased use of pillows at night   COPD:  Current medications: Symbicort 160/4.5 mcg 2 puffs twice daily, Spiriva 2.5 mcg 2 puffs daily; albuterol HFA PRN - usually ~ twice daily; cetirizine 10 mg daily PRN, fluticasone nasal spray daily PRN Medications tried in the past:    Chronic Pain: Current medications: oxycodone/acetaminophen 10/325 mg up to 5 tablets daily, tizanidine 6 mg up to three times daily  Depression/Anxiety: Current medications: prescribed escitalopram 10 mg daily, but has not filled since March. Reports she discontinued due to lack of benefit;  Health Maintenance  Health Maintenance Due  Topic Date Due   FOOT EXAM  06/02/2021     Objective: Lab Results  Component Value Date   HGBA1C 7.3 (A) 03/17/2022   HGBA1C 7.3 03/17/2022   HGBA1C 7.3 (A) 03/17/2022   HGBA1C 7.3 (A) 03/17/2022    Lab Results  Component Value Date   CREATININE 1.07 (H) 05/02/2022   BUN 13 05/02/2022  NA 140 05/02/2022   K 3.6 05/02/2022   CL 109 05/02/2022   CO2 18 (L) 05/02/2022    Lab Results  Component Value Date   CHOL 153 12/09/2021   HDL 54 12/09/2021   LDLCALC 74 12/09/2021   TRIG 144 12/09/2021   CHOLHDL 2.8  12/09/2021    Medications Reviewed Today     Reviewed by Alden Hipp, RPH-CPP (Pharmacist) on 05/10/22 at 1116  Med List Status: <None>   Medication Order Taking? Sig Documenting Provider Last Dose Status Informant  Accu-Chek Softclix Lancets lancets [id number] Yes USE TO TEST AS DIRECTED UP TO FOUR TIMES DAILY. Shamleffer, Konrad Dolores, MD Taking Active   acetaminophen (TYLENOL) 500 MG tablet [id number] Yes Take by mouth. [provider] Taking Active   albuterol (PROVENTIL) (2.5 MG/3ML) 0.083% nebulizer solution [id number] No Inhale into the lungs.  Patient not taking: Reported on 05/10/2022   [provider] Not Taking Active   albuterol (VENTOLIN HFA) 108 (90 Base) MCG/ACT inhaler [id number] Yes Inhale 2 puffs into the lungs every 6 (six) hours as needed for wheezing or shortness of breath. Luciano Cutter, MD Taking Active Self, Pharmacy Records  budesonide-formoterol [facility]) 160-4.5 MCG/ACT inhaler [id number] Yes INHALE 2 PUFFS INTO THE LUNGS 2 (TWO) TIMES DAILY. Luciano Cutter, MD Taking Active Self, Pharmacy Records  cetirizine (ZYRTEC) 10 MG tablet [id number] Yes Take 1 tablet by mouth daily. [provider] Taking Active   Dexlansoprazole (DEXILANT) 30 MG capsule DR [id number] Yes Take 1 capsule (30 mg total) by mouth daily. Beverley Fiedler, MD Taking Active Self, Pharmacy Records  diclofenac Sodium (VOLTAREN) 1 % GEL [id number] No APPLY 2 GRAMS TOPICALLY TO THE AFFECTED AREA FOUR TIMES DAILY  Patient not taking: Reported on 05/10/2022   Marlyne Beards, MD Not Taking Active   dicyclomine (BENTYL) 20 MG tablet [id number] No Take 1 tablet (20 mg total) by mouth 2 (two) times daily.  Patient not taking: Reported on 05/10/2022   Arthor Captain, PA-C Not Taking Active Self, Pharmacy Records           Med Note [facility], Desma Paganini May 10, 2022 11:07 AM)    empagliflozin (JARDIANCE) 25 MG TABS tablet [id number] No Take 1 tablet (25 mg total) by  mouth daily before breakfast.  Patient not taking: Reported on 05/10/2022   Shamleffer, Konrad Dolores, MD Not Taking Active Self, Pharmacy Records           Med Note [place], Desma Paganini May 10, 2022 11:07 AM)    escitalopram (LEXAPRO) 10 MG tablet [id number] No Take 1 tablet (10 mg total) by mouth daily.  Patient not taking: Reported on 05/10/2022   Ivonne Andrew, NP Not Taking Expired 04/22/22 2359 Self, Pharmacy Records           Med Note [facility] Coots, Christpoher Sievers T   Wed May 10, 2022 11:08 AM)    fluticasone Aleda Grana) 50 MCG/ACT nasal spray [id number] Yes Place 2 sprays into both nostrils daily. Barbette Merino, NP Taking Active Self           Med Note ([place], Genoveva Ill   Wed Mar 23, 2021  6:42 PM)    furosemide (LASIX) 40 MG tablet [id number] Yes TAKE 1 TABLET(40 MG) BY MOUTH TWICE DAILY  Patient taking differently: Take 40 mg by mouth 2 (two) times daily.   Elder Negus, MD Taking Active Self, Pharmacy Records  glucose blood (ACCU-CHEK GUIDE) test strip [id number] Yes  USE AS DIRECTED UP TO FOUR TIMES DAILY [place], Jake Church I, NP Taking Active Self  haloperidol (HALDOL) 5 MG tablet [id number] Yes Take 1 tablet (5 mg total) by mouth every 8 (eight) hours as needed (For nausea/vomiting). Molpus, John, MD Taking Active   HUMALOG KWIKPEN 200 UNIT/ML KwikPen [id number]  Inject 180 Units into the skin daily. [provider]  Active Self, Pharmacy Records  hydrochlorothiazide (MICROZIDE) 12.5 MG capsule [id number] Yes TAKE 1 CAPSULE(12.5 MG) BY MOUTH DAILY  Patient taking differently: Take 12.5 mg by mouth daily.   Alethia Berthold, PA-C Taking Active Self, Pharmacy Records  hydrOXYzine (ATARAX) 10 MG tablet [id number] Yes Take 10 mg by mouth at bedtime. [provider] Taking Active   Insulin Pen Needle 31G X 8 MM MISC [id number] Yes 1 Device by Does not apply route in the morning, at noon, in the evening, and at bedtime. Shamleffer, Melanie Crazier, MD Taking Active  Self  isosorbide mononitrate (IMDUR) 30 MG 24 hr tablet [id number] Yes TAKE 1 TABLET(30 MG) BY MOUTH DAILY Patwardhan, Reynold Bowen, MD Taking Active   LANTUS SOLOSTAR 100 UNIT/ML Solostar Pen [id number]  INJECT 110 UNITS UNDER THE SKIN DAILY Shamleffer, Melanie Crazier, MD  Active   losartan (COZAAR) 100 MG tablet [id number] Yes TAKE 1 TABLET BY MOUTH EVERY DAY Patwardhan, Manish J, MD Taking Active Self, Pharmacy Records  metoprolol succinate (TOPROL-XL) 100 MG 24 hr tablet [id number] Yes Take 100 mg by mouth daily. Take with or immediately following a meal. [provider] Taking Active Self, Pharmacy Records           Med Note Jodi Mourning, Rosary Filosa T   Wed May 10, 2022 11:12 AM)    norethindrone (AYGESTIN) 5 MG tablet [id number] Yes Take 2 tablets (10 mg total) by mouth 2 (two) times daily as needed. Take for 3-4 days at a time as needed for heavy vaginal bleeding Aletha Halim, MD Taking Active   ondansetron (ZOFRAN) 4 MG tablet [id number] Yes Take 1 tablet (4 mg total) by mouth every 8 (eight) hours as needed for nausea or vomiting. Pyrtle, Lajuan Lines, MD Taking Active Self, Pharmacy Records           Med Note [place], [place] May 10, 2022 11:13 AM)    oxyCODONE-acetaminophen (PERCOCET) 10-325 MG tablet [id number] Yes Take 1 tablet by mouth 5 (five) times daily as needed for pain. Bayard Hugger, NP Taking Active   pantoprazole (PROTONIX) 40 MG tablet [id number] No Take 1 tablet (40 mg total) by mouth daily.  Patient not taking: Reported on 05/10/2022   Margarita Mail, PA-C Not Taking Active Self, Pharmacy Records           Med Note [facility], [place] May 10, 2022 11:13 AM)    potassium chloride SA (KLOR-CON M) 20 MEQ tablet [id number] Yes TAKE 1 TABLET(20 MEQ) BY MOUTH DAILY  Patient taking differently: Take 20 mEq by mouth daily.   Lorretta Harp, MD Taking Active Self, Pharmacy Records  rosuvastatin (CRESTOR) 20 MG tablet [id number] No Take 1 tablet (20 mg total) by mouth  daily.  Patient not taking: Reported on 05/10/2022   Nigel Mormon, MD Not Taking Active Self, Pharmacy Records           Med Note Jonny Ruiz May 10, 2022 11:14 AM)    SPIRIVA RESPIMAT 2.5 MCG/ACT AERS [id number] Yes INHALE 2 PUFFS INTO THE LUNGS DAILY Icard, Octavio Graves, DO  Taking Active Self, Pharmacy Records  spironolactone (ALDACTONE) 50 MG tablet [id number] No TAKE 1 TABLET(50 MG) BY MOUTH DAILY  Patient not taking: Reported on 05/10/2022   Nigel Mormon, MD Not Taking Consider Medication Status and Discontinue Self, Pharmacy Records           Med Note Jodi Mourning, Grace Bushy   Wed May 10, 2022 11:15 AM)    sucralfate (CARAFATE) 1 g tablet [id number] Yes Take 1 tablet (1 g total) by mouth 4 (four) times daily -  with meals and at bedtime. Isla Pence, MD Taking Active Self, Pharmacy Records  tizanidine (ZANAFLEX) 6 MG capsule [id number] Yes Take 1 capsule (6 mg total) by mouth 3 (three) times daily. For muscle spasms Dorena Dew, FNP Taking Active               Assessment/Plan:   Diabetes: - Currently uncontrolled - Due to high insulin burden, recommend changing Lantus to concentrated Toujeo Max to prevent patient from having to take multiple injections. Discussed with PCP.  - Collaborated with pharmacy to refill Jardiance.  - Continue current regimen at this time while medication access is being adjusted.  - Discussed benefit of restarting CGM moving forward, but would consider Libre as they have different adhesive than DexCom  Hyperlipidemia/ASCVD Risk Reduction: - Currently uncontrolled due to lack of adherence - Recommend to restart rosuvastatin. Collaborated with pharmacy to refill.  Heart Failure: - Currently appropriately managed per prescribed medications, but support needed for adherence.  - Collaborated with pharmacy to refill Jardiance. New scripts needed for metoprolol and spironolactone. Will collaborate with cardiology. Patient has  losartan. Discussed that adherence to chronic meds may help reduce need for diuretic. Pending blood pressure control with adding spironolactone back, may be able to eliminate HCTZ. Per last cardiology note, patient was not found to have cardiovascular disease. Patient may not need isosorbide. Will collaborate with cardiology.  - Reviewed appropriate blood pressure monitoring technique and reviewed goal blood pressure  COPD: - Currently uncontrolled but on appropriate therapy.  - Recommend to continue current regimen at this time   Chronic Pain: - Will collaborate with PM&R to see if they would take over prescribing high dose muscle relaxer script with refills.    Follow Up Plan: phone call in 2 weeks  Catie TJodi Mourning, PharmD, [facility] [phone]

## 2022-05-10 NOTE — Patient Instructions (Addendum)
Kharis,   It was great talking with you today!  Here's how I would recommend you take your chronic medications:   Daily - Toujeo 160 units. Call me if you have any problems with low blood sugars or symptoms of low blood sugars Before each meal - Novolog 60 units   Morning: - Furosemide 40 mg (fluid) - Spironolactone 50 mg (heart failure/blood pressure)  - Jardiance (heart failure and diabetes) - Losartan 100 mg (heart failure/blood pressure) - Metoprolol succinate 100 mg (heart failure)  - Dexilant 30 mg (30 minutes before breakfast) - Hydrochlorothiazide 25 mg (blood pressure) - we might be able to stop this moving forward, based on your blood pressure - Isosorbide mononitrate 30 mg (preventing chest pain) - Rosuvastatin 20 mg (cholesterol) - Tizanidine 6 mg (muscle relaxer)  Midday: - Furosemide 40 mg (fluid) - Tizanidine 6 mg (muscle relaxer)  Evening: - Tizanidine 6 mg (muscle relaxer) - Dr. Carlis Abbott has agreed to take over prescribing this and will send with refills.   Check your blood pressure once daily, and any time you have concerning symptoms like headache, chest pain, dizziness, shortness of breath, or vision changes.   Our goal is less than 130/80.  To appropriately check your blood pressure, make sure you do the following:  1) Avoid caffeine, exercise, or tobacco products for 30 minutes before checking. Empty your bladder. 2) Sit with your back supported in a flat-backed chair. Rest your arm on something flat (arm of the chair, table, etc). 3) Sit still with your feet flat on the floor, resting, for at least 5 minutes.  4) Check your blood pressure. Take 1-2 readings.  5) Write down these readings and bring with you to any provider appointments.  Bring your home blood pressure machine with you to a provider's office for accuracy comparison at least once a year.   Make sure you take your blood pressure medications before you come to any office visit, even if you  were asked to fast for labs.   Please call me with questions!  Catie Eppie Gibson, PharmD, [facility] [phone]

## 2022-05-13 ENCOUNTER — Other Ambulatory Visit: Payer: Self-pay | Admitting: Cardiology

## 2022-05-13 DIAGNOSIS — I10 Essential (primary) hypertension: Secondary | ICD-10-CM

## 2022-05-16 ENCOUNTER — Telehealth: Payer: Self-pay | Admitting: Pharmacist

## 2022-05-16 NOTE — Telephone Encounter (Signed)
Received message from Dr. Rosemary Holms, cardiology, in agreement with stopping isosorbide to reduce pill burden if angina is not a current concern. Contacted patient to discuss. Left voicemail for her to return my call. Will also send MyChart message.   Catie Eppie Gibson, PharmD, [facility] [phone]

## 2022-05-17 ENCOUNTER — Ambulatory Visit: Payer: Medicaid Other | Admitting: Registered"

## 2022-05-23 ENCOUNTER — Telehealth: Payer: Self-pay | Admitting: Pharmacist

## 2022-05-23 ENCOUNTER — Ambulatory Visit: Payer: Self-pay

## 2022-05-23 NOTE — Telephone Encounter (Signed)
Attempted to call patient for scheduled medication management phone call; left voicemail for patient to return my call at her convenience.   Catie Eppie Gibson, PharmD, [facility] [phone]

## 2022-05-24 NOTE — Telephone Encounter (Signed)
Called patient, LVM for patient to return my call at her convenience.

## 2022-05-25 LAB — TOXASSURE SELECT,+ANTIDEPR,UR

## 2022-05-26 ENCOUNTER — Encounter (HOSPITAL_BASED_OUTPATIENT_CLINIC_OR_DEPARTMENT_OTHER): Payer: Self-pay

## 2022-05-26 ENCOUNTER — Emergency Department (HOSPITAL_BASED_OUTPATIENT_CLINIC_OR_DEPARTMENT_OTHER)
Admission: EM | Admit: 2022-05-26 | Discharge: 2022-05-26 | Disposition: A | Discharge status: Home / Self Care | Payer: Medicare Other | Attending: Emergency Medicine | Admitting: Emergency Medicine

## 2022-05-26 DIAGNOSIS — E119 Type 2 diabetes mellitus without complications: Secondary | ICD-10-CM

## 2022-05-26 DIAGNOSIS — I509 Heart failure, unspecified: Secondary | ICD-10-CM | POA: Insufficient documentation

## 2022-05-26 DIAGNOSIS — Z7984 Long term (current) use of oral hypoglycemic drugs: Secondary | ICD-10-CM | POA: Diagnosis not present

## 2022-05-26 DIAGNOSIS — F17200 Nicotine dependence, unspecified, uncomplicated: Secondary | ICD-10-CM | POA: Diagnosis not present

## 2022-05-26 DIAGNOSIS — Z794 Long term (current) use of insulin: Secondary | ICD-10-CM | POA: Diagnosis not present

## 2022-05-26 DIAGNOSIS — M545 Low back pain, unspecified: Secondary | ICD-10-CM | POA: Diagnosis not present

## 2022-05-26 DIAGNOSIS — M549 Dorsalgia, unspecified: Secondary | ICD-10-CM | POA: Diagnosis present

## 2022-05-26 DIAGNOSIS — I10 Essential (primary) hypertension: Secondary | ICD-10-CM

## 2022-05-26 DIAGNOSIS — I110 Hypertensive heart disease with heart failure: Secondary | ICD-10-CM | POA: Insufficient documentation

## 2022-05-26 DIAGNOSIS — Z7951 Long term (current) use of inhaled steroids: Secondary | ICD-10-CM | POA: Insufficient documentation

## 2022-05-26 DIAGNOSIS — W19XXXA Unspecified fall, initial encounter: Secondary | ICD-10-CM | POA: Diagnosis not present

## 2022-05-26 DIAGNOSIS — Z79899 Other long term (current) drug therapy: Secondary | ICD-10-CM | POA: Diagnosis not present

## 2022-05-26 DIAGNOSIS — J449 Chronic obstructive pulmonary disease, unspecified: Secondary | ICD-10-CM | POA: Diagnosis not present

## 2022-05-26 LAB — PREGNANCY, URINE: Preg Test, Ur: NEGATIVE

## 2022-05-26 LAB — CBG MONITORING, ED: Glucose-Capillary: 285 mg/dL — ABNORMAL HIGH (ref 70–99)

## 2022-05-26 MED ORDER — ACETAMINOPHEN 325 MG PO TABS
650.0000 mg | ORAL_TABLET | Freq: Once | ORAL | Status: AC
  Administered 2022-05-26: 650 mg via ORAL
  Filled 2022-05-26: qty 2

## 2022-05-26 MED ORDER — HYDROMORPHONE HCL 1 MG/ML IJ SOLN
1.0000 mg | Freq: Once | INTRAMUSCULAR | Status: AC
  Administered 2022-05-26: 1 mg via INTRAMUSCULAR
  Filled 2022-05-26: qty 1

## 2022-05-26 NOTE — ED Provider Notes (Signed)
[facility] EMERGENCY DEPT Provider Note   CSN: [id number] Arrival date & time: 05/26/22  1015     History {Add pertinent medical, surgical, social history, OB history to HPI:1} Chief Complaint  Patient presents with   Jill Shaw is a 46 y.o. female with history of DMT2, CHF, anxiety, chronic gastritis, COPD, GERD, sickle cell trait, HTN, tobacco abuse, and chronic back pain.  Presenting today with acute back pain after falling 2 days ago.  Worse with movement or palpation.  States she believes her blood sugar "got a little low" and fell onto her lower back.  Also states this happens once a month, to once every few months.  Is on short and long-acting insulin.  Did not check her blood sugar after the event.  Denies hitting her head or losing consciousness.  No urinary or bowel incontinence, seizures, neck stiffness, head pain or headaches, vision changes, shortness of breath, or chest pain before or after the fall.  No saddle anesthesia.  Denies weakness of lower extremities.  No other complaints or injuries at this time.    States usually comes the ED for chronic gastritis and is given Dilaudid which helps her pain.  Currently on oxycodone 10 mg 5 times per day.  Repeatedly requests Dilaudid.  Driven here by family member.  The history is provided by the patient and medical records.  Fall       Home Medications Prior to Admission medications   Medication Sig Start Date End Date Taking? Authorizing Provider  Accu-Chek Softclix Lancets lancets USE TO TEST AS DIRECTED UP TO FOUR TIMES DAILY. 03/06/22   Shamleffer, Konrad Dolores, MD  acetaminophen (TYLENOL) 500 MG tablet Take by mouth. 12/14/20   [provider]  albuterol (PROVENTIL) (2.5 MG/3ML) 0.083% nebulizer solution Inhale into the lungs. Patient not taking: Reported on 05/10/2022 09/27/20   [provider]  albuterol (VENTOLIN HFA) 108 (90 Base) MCG/ACT inhaler Inhale 2 puffs into the  lungs every 6 (six) hours as needed for wheezing or shortness of breath. 07/18/21   Luciano Cutter, MD  budesonide-formoterol (SYMBICORT) 160-4.5 MCG/ACT inhaler INHALE 2 PUFFS INTO THE LUNGS 2 (TWO) TIMES DAILY. 07/18/21   Luciano Cutter, MD  cetirizine (ZYRTEC) 10 MG tablet Take 1 tablet by mouth daily. 06/07/21   [provider]  Dexlansoprazole (DEXILANT) 30 MG capsule DR Take 1 capsule (30 mg total) by mouth daily. 11/11/21 11/11/22  Pyrtle, Carie Caddy, MD  diclofenac Sodium (VOLTAREN) 1 % GEL APPLY 2 GRAMS TOPICALLY TO THE AFFECTED AREA FOUR TIMES DAILY Patient not taking: Reported on 05/10/2022 03/05/22   Marlyne Beards, MD  dicyclomine (BENTYL) 20 MG tablet Take 1 tablet (20 mg total) by mouth 2 (two) times daily. Patient not taking: Reported on 05/10/2022 01/20/22   Arthor Captain, PA-C  empagliflozin (JARDIANCE) 25 MG TABS tablet Take 1 tablet (25 mg total) by mouth daily before breakfast. Patient not taking: Reported on 05/10/2022 06/27/21   Shamleffer, Konrad Dolores, MD  fluticasone (FLONASE) 50 MCG/ACT nasal spray Place 2 sprays into both nostrils daily. 12/02/20   Barbette Merino, NP  furosemide (LASIX) 40 MG tablet TAKE 1 TABLET(40 MG) BY MOUTH TWICE DAILY Patient taking differently: Take 40 mg by mouth 2 (two) times daily. 01/24/22   Patwardhan, Manish J, MD  glucose blood (ACCU-CHEK GUIDE) test strip USE AS DIRECTED UP TO FOUR TIMES DAILY 07/29/21   Orion Crook I, NP  haloperidol (HALDOL) 5 MG tablet Take 1  tablet (5 mg total) by mouth every 8 (eight) hours as needed (For nausea/vomiting). 03/31/22   Molpus, John, MD  HUMALOG KWIKPEN 200 UNIT/ML KwikPen Inject 180 Units into the skin daily. 02/15/22   [provider]  hydrochlorothiazide (MICROZIDE) 12.5 MG capsule TAKE 1 CAPSULE(12.5 MG) BY MOUTH DAILY Patient taking differently: Take 12.5 mg by mouth daily. 12/14/21   Cantwell, Celeste C, PA-C  hydrOXYzine (ATARAX) 10 MG tablet Take 10 mg by mouth at bedtime. 04/26/22    [provider]  insulin glargine, 2 Unit Dial, (TOUJEO MAX SOLOSTAR) 300 UNIT/ML Solostar Pen Inject 160 Units into the skin daily. 05/10/22   Fenton Foy, NP  Insulin Pen Needle 31G X 8 MM MISC 1 Device by Does not apply route in the morning, at noon, in the evening, and at bedtime. 02/16/21   Shamleffer, Melanie Crazier, MD  isosorbide mononitrate (IMDUR) 30 MG 24 hr tablet TAKE 1 TABLET(30 MG) BY MOUTH DAILY 03/23/22   Patwardhan, Manish J, MD  losartan (COZAAR) 100 MG tablet TAKE 1 TABLET BY MOUTH EVERY DAY 05/15/22   Patwardhan, Manish J, MD  metoprolol succinate (TOPROL-XL) 100 MG 24 hr tablet Take 100 mg by mouth daily. Take with or immediately following a meal.    [provider]  norethindrone (AYGESTIN) 5 MG tablet Take 2 tablets (10 mg total) by mouth 2 (two) times daily as needed. Take for 3-4 days at a time as needed for heavy vaginal bleeding 04/21/22   Aletha Halim, MD  ondansetron (ZOFRAN) 4 MG tablet Take 1 tablet (4 mg total) by mouth every 8 (eight) hours as needed for nausea or vomiting. 11/11/21   Pyrtle, Lajuan Lines, MD  oxyCODONE-acetaminophen (PERCOCET) 10-325 MG tablet Take 1 tablet by mouth 5 (five) times daily as needed for pain. 05/03/22 05/03/23  Bayard Hugger, NP  potassium chloride SA (KLOR-CON M) 20 MEQ tablet TAKE 1 TABLET(20 MEQ) BY MOUTH DAILY Patient not taking: Reported on 05/10/2022 02/08/22   Lorretta Harp, MD  rosuvastatin (CRESTOR) 20 MG tablet TAKE 1 TABLET(20 MG) BY MOUTH DAILY 05/10/22   Patwardhan, Reynold Bowen, MD  SPIRIVA RESPIMAT 2.5 MCG/ACT AERS INHALE 2 PUFFS INTO THE LUNGS DAILY 02/03/22   Icard, Octavio Graves, DO  spironolactone (ALDACTONE) 50 MG tablet TAKE 1 TABLET(50 MG) BY MOUTH DAILY 12/22/20   Patwardhan, Manish J, MD  sucralfate (CARAFATE) 1 g tablet Take 1 tablet (1 g total) by mouth 4 (four) times daily -  with meals and at bedtime. 08/21/21   Isla Pence, MD  tizanidine (ZANAFLEX) 6 MG capsule Take 1 capsule (6 mg total) by mouth 3  (three) times daily. For muscle spasms 04/26/22 05/26/22  Dorena Dew, FNP      Allergies    Elavil [amitriptyline], Orudis [ketoprofen], Desyrel [trazodone], Aspirin, Motrin [ibuprofen], Naprosyn [naproxen], Neurontin [gabapentin], Sulfa antibiotics, Ultram [tramadol], Victoza [liraglutide], and Zegerid [omeprazole-sodium bicarbonate]    Review of Systems   Review of Systems  Musculoskeletal:  Positive for back pain.    Physical Exam Updated Vital Signs BP (!) 173/72   Pulse 83   Temp 98.6 F (37 C)   Resp 16   SpO2 99%  Physical Exam Vitals and nursing note reviewed.  Constitutional:      General: She is not in acute distress.    Appearance: She is well-developed. She is obese. She is not ill-appearing, toxic-appearing or diaphoretic.  HENT:     Head: Normocephalic and atraumatic.  Eyes:  Conjunctiva/sclera: Conjunctivae normal.  Neck:     Comments: Very supple on exam, no meningismus or torticollis.  No midline tenderness. Cardiovascular:     Rate and Rhythm: Normal rate and regular rhythm.     Pulses: Normal pulses.     Heart sounds: No murmur heard. Pulmonary:     Effort: Pulmonary effort is normal. No respiratory distress.     Breath sounds: Normal breath sounds.  Abdominal:     Palpations: Abdomen is soft.     Tenderness: There is no abdominal tenderness.  Musculoskeletal:        General: No swelling.     Cervical back: Neck supple. No rigidity.     Comments: Midline bandlike tenderness of the lower back.  Mild midline tenderness, which the patient states is at baseline.  Sitting comfortably, able to turn, rotate, and bend/flex the lower back without difficulty when unprompted/distracted.  Skin:    General: Skin is warm and dry.     Capillary Refill: Capillary refill takes less than 2 seconds.  Neurological:     General: No focal deficit present.     Mental Status: She is alert and oriented to person, place, and time.     Sensory: No sensory deficit.      Motor: No weakness.     Coordination: Coordination normal.     Gait: Gait normal.     Comments: No saddle anesthesia.  Negative SLRs bilaterally.  Psychiatric:        Mood and Affect: Mood normal.     ED Results / Procedures / Treatments   Labs (all labs ordered are listed, but only abnormal results are displayed) Labs Reviewed  CBG MONITORING, ED - Abnormal; Notable for the following components:      Result Value   Glucose-Capillary 285 (*)    All other components within normal limits  PREGNANCY, URINE    EKG None  Radiology No results found.  Procedures Procedures  {Document cardiac monitor, telemetry assessment procedure when appropriate:1}  Medications Ordered in ED Medications  HYDROmorphone (DILAUDID) injection 1 mg (1 mg Intramuscular Given 05/26/22 1412)  acetaminophen (TYLENOL) tablet 650 mg (650 mg Oral Given 05/26/22 1411)    ED Course/ Medical Decision Making/ A&P                           Medical Decision Making Amount and/or Complexity of Data Reviewed Labs: ordered.  Risk OTC drugs. Prescription drug management.   46 y.o. female presents to the ED for concern of Fall   This involves an extensive number of treatment options, and is a complaint that carries with it a high risk of complications and morbidity.      Past Medical History / Co-morbidities / Social History: Hx of DMT2, CHF, anxiety, chronic gastritis, GERD, COPD, sickle cell trait, HTN, tobacco abuse, and chronic back pain Social Determinants of Health include chronic tobacco use for his age and counseling was provided.  Also on chronic pain management.  Additional History:  Internal and external records from outside source obtained and reviewed including prior ED visits and chronic pain management, please see for details.  Lab Tests: I ordered, and personally interpreted labs.  The pertinent results include:   Urine pregnancy: Negative  Imaging Studies: Offered for x-ray imaging  of lumbar spine, however patient declined, requesting this to be done with her orthopedic/pain specialist.  ED Course: Pt well-appearing on exam.  ***.    Patient in  NAD and in good condition at time of discharge.  Disposition: After consideration the patient's encounter today, I do not feel today's workup suggests an emergent condition requiring admission or immediate intervention beyond what has been performed at this time.  Safe for discharge; instructed to return immediately for worsening symptoms, change in symptoms or any other concerns.  I have reviewed the patients home medicines and have made adjustments as needed.  Discussed course of treatment with the patient, whom demonstrated understanding.  Patient in agreement and has no further questions.     This chart was dictated using voice recognition software.  Despite best efforts to proofread, errors can occur which can change the documentation meaning.   {Document critical care time when appropriate:1} {Document review of labs and clinical decision tools ie heart score, Chads2Vasc2 etc:1}  {Document your independent review of radiology images, and any outside records:1} {Document your discussion with family members, caretakers, and with consultants:1} {Document social determinants of health affecting pt's care:1} {Document your decision making why or why not admission, treatments were needed:1} Final Clinical Impression(s) / ED Diagnoses Final diagnoses:  Acute bilateral low back pain, unspecified whether sciatica present    Rx / DC Orders ED Discharge Orders     None

## 2022-05-26 NOTE — Discharge Instructions (Signed)
Please call your pain management clinic to schedule an earlier follow-up the next week Friday as discussed.  Continue to manage pain with heat packs and your pain medication.  You may also utilize additional Tylenol, however keep in mind your regular pain medication has Tylenol (acetaminophen) in it.  Do not exceed 4000 mg of acetaminophen daily.  Return to the ED for new or worsening symptoms as discussed.

## 2022-05-26 NOTE — ED Triage Notes (Signed)
Pt presents POV from home  Pt reports falling yesterday onto the floor. Pt reports her sugar dropped and she fell.  Pt c/o Right lower back pain, N/V

## 2022-05-29 ENCOUNTER — Telehealth: Payer: Self-pay

## 2022-05-29 ENCOUNTER — Telehealth: Payer: Self-pay | Admitting: *Deleted

## 2022-05-29 NOTE — Telephone Encounter (Signed)
This urine drug screen was done out of the accepted window of time. Ordered 05/03/22 and collected 05/19/22.  This is not acceptable time frame. Once order placed it should be done with in 24 hours.  Drugs present are expected.

## 2022-05-29 NOTE — Telephone Encounter (Signed)
Patient is calling for refill on Percocet 10-325. Per PMP, last fill was 05/03/22. She needs it sent to [facility] on [address]

## 2022-05-30 ENCOUNTER — Other Ambulatory Visit: Payer: Self-pay

## 2022-05-30 NOTE — Telephone Encounter (Addendum)
I spoke with patient and she stated she could not come on 8/16 and she wasn't sure about 8/17. Informed patient that she should be coming in 5 days before her medications are due just in case it needs a PA or they do not have the medication in stock. She stated she may not be able to come until 8/18. She got her medications on 05/03/22

## 2022-05-30 NOTE — Telephone Encounter (Signed)
Brittney,  Did you have Ms Karan move her appointment.  [name]

## 2022-05-31 ENCOUNTER — Ambulatory Visit: Payer: Medicare Other | Admitting: Registered Nurse

## 2022-05-31 ENCOUNTER — Encounter: Payer: Self-pay | Admitting: Physical Medicine and Rehabilitation

## 2022-05-31 ENCOUNTER — Other Ambulatory Visit: Payer: Self-pay | Admitting: Nurse Practitioner

## 2022-05-31 MED ORDER — TIZANIDINE HCL 4 MG PO TABS: 4.0000 mg | ORAL_TABLET | Freq: Four times a day (QID) | ORAL | 0 refills | Status: DC | PRN

## 2022-06-01 ENCOUNTER — Other Ambulatory Visit: Payer: Self-pay

## 2022-06-01 ENCOUNTER — Encounter: Payer: Self-pay | Admitting: Registered Nurse

## 2022-06-01 ENCOUNTER — Encounter: Payer: Medicare Other | Attending: Physical Medicine and Rehabilitation | Admitting: Registered Nurse

## 2022-06-01 VITALS — BP 142/82 | HR 104 | Ht 64.0 in | Wt 276.4 lb

## 2022-06-01 DIAGNOSIS — G894 Chronic pain syndrome: Secondary | ICD-10-CM | POA: Diagnosis present

## 2022-06-01 DIAGNOSIS — G629 Polyneuropathy, unspecified: Secondary | ICD-10-CM | POA: Diagnosis present

## 2022-06-01 DIAGNOSIS — M170 Bilateral primary osteoarthritis of knee: Secondary | ICD-10-CM

## 2022-06-01 DIAGNOSIS — M62838 Other muscle spasm: Secondary | ICD-10-CM | POA: Diagnosis present

## 2022-06-01 DIAGNOSIS — M5416 Radiculopathy, lumbar region: Secondary | ICD-10-CM | POA: Diagnosis present

## 2022-06-01 DIAGNOSIS — K219 Gastro-esophageal reflux disease without esophagitis: Secondary | ICD-10-CM | POA: Diagnosis present

## 2022-06-01 DIAGNOSIS — Z79891 Long term (current) use of opiate analgesic: Secondary | ICD-10-CM | POA: Diagnosis present

## 2022-06-01 DIAGNOSIS — Z5181 Encounter for therapeutic drug level monitoring: Secondary | ICD-10-CM | POA: Diagnosis present

## 2022-06-01 MED ORDER — OXYCODONE-ACETAMINOPHEN 10-325 MG PO TABS
1.0000 | ORAL_TABLET | Freq: Every day | ORAL | 0 refills | Status: DC | PRN
  Filled 2022-06-01 (×2): qty 150, 30d supply, fill #0

## 2022-06-01 MED ORDER — TIZANIDINE HCL 4 MG PO TABS
4.0000 mg | ORAL_TABLET | Freq: Four times a day (QID) | ORAL | 0 refills | Status: DC | PRN
Start: 2022-06-01 — End: 2022-06-12

## 2022-06-01 NOTE — Progress Notes (Signed)
Subjective:    Patient ID: Jill Shaw, female    DOB: 1976/07/22, 46 y.o.   MRN: [id number]  HPI: Jill Shaw is a 46 y.o. female who returns for follow up appointment for chronic pain and medication refill. She states her pain is located in her lower back radiating into her right lower extremity and she reports bilateral hand pain tingling and burning, she asked about Qutenza for her hands. This was discussed with Dr Carlis Abbott.  She rates her pain 9. Her current exercise regime is walking and performing stretching exercises.  Ms. Hutmacher UDS ordered on 05/03/2022, she didn't go to the lab until 05/19/2022, We discussed the policy regarding UDS, she states she didn't know she had to go on the day it was ordered. UDS ordered for today, she states a week ago she was with her family, admits to eating brownies that had marijuana in them. Reviewed the narcotic policy with her, she verbalizes understanding. Awaiting UDS results, if + THC, she will be given a warning per Dr Carlis Abbott recommendation.   Ms. Witherington Morphine equivalent is 75.00 MME.   UDS ordered today.     Pain Inventory Average Pain 10 Pain Right Now 9 My pain is sharp, burning, dull, stabbing, tingling, and aching  In the last 24 hours, has pain interfered with the following? General activity 10 Relation with others 10 Enjoyment of life 10 What TIME of day is your pain at its worst? morning , daytime, evening, and night Sleep (in general) Poor  Pain is worse with: walking, bending, sitting, inactivity, standing, and some activites Pain improves with: heat/ice and medication Relief from Meds: 10  Family History  Problem Relation Age of Onset   Diabetes Mother    Hypertension Mother    Migraines Paternal Grandfather    Colon cancer Neg Hx    Esophageal cancer Neg Hx    Stomach cancer Neg Hx    Rectal cancer Neg Hx    Social History   Socioeconomic History   Marital status: Legally Separated    Spouse name: Not  on file   Number of children: 3   Years of education: Not on file   Highest education level: Not on file  Occupational History   Occupation: unemployed  Tobacco Use   Smoking status: Some Days    Packs/day: 0.25    Years: 26.00    Total pack years: 6.50    Types: Cigarettes    Last attempt to quit: 09/13/2020    Years since quitting: 1.7   Smokeless tobacco: Never   Tobacco comments:    4-5 cigarettes/day  Vaping Use   Vaping Use: Never used  Substance and Sexual Activity   Alcohol use: No   Drug use: No   Sexual activity: Not Currently    Birth control/protection: None  Other Topics Concern   Not on file  Social History Narrative   Right Handed   Lives in a one story apartment, but lives on the second floor   Drinks caffeine once in awhile   Social Determinants of Health   Financial Resource Strain: Not on file  Food Insecurity: Food Insecurity Present (12/08/2021)   Hunger Vital Sign    Worried About Running Out of Food in the Last Year: Never true    Ran Out of Food in the Last Year: Sometimes true  Transportation Needs: No Transportation Needs (12/08/2021)   PRAPARE - Administrator, Civil Service (Medical): No  Lack of Transportation (Non-Medical): No  Physical Activity: Not on file  Stress: Not on file  Social Connections: Not on file   Past Surgical History:  Procedure Laterality Date   CESAREAN SECTION     x 1. for twins   DILATION AND CURETTAGE OF UTERUS N/A 08/20/2019   Procedure: DILATATION AND CURETTAGE;  Surgeon: Allie Bossier, MD;  Location: [facility] OR;  Service: Gynecology;  Laterality: N/A;   ENDOMETRIAL ABLATION N/A 08/20/2019   Procedure: Minerva Ablation;  Surgeon: Allie Bossier, MD;  Location: [facility] OR;  Service: Gynecology;  Laterality: N/A;   EYE SURGERY Bilateral    laser right and cataract removed left eye   LEFT HEART CATH AND CORONARY ANGIOGRAPHY N/A 08/30/2021   Procedure: LEFT HEART CATH AND CORONARY ANGIOGRAPHY;  Surgeon:  Elder Negus, MD;  Location: [facility] INVASIVE CV LAB;  Service: Cardiovascular;  Laterality: N/A;   RADIOLOGY WITH ANESTHESIA N/A 09/16/2019   Procedure: MRI WITH ANESTHESIA   L SPINE WITHOUT CONTRAST, T SPINE WITHOUT CONTRAST , CERVICAL WITHOUT CONTRAST;  Surgeon: Radiologist, Medication, MD;  Location: [facility] OR;  Service: Radiology;  Laterality: N/A;   TUBAL LIGATION     interval BTL   UPPER GI ENDOSCOPY  07/2017   Past Surgical History:  Procedure Laterality Date   CESAREAN SECTION     x 1. for twins   DILATION AND CURETTAGE OF UTERUS N/A 08/20/2019   Procedure: DILATATION AND CURETTAGE;  Surgeon: Allie Bossier, MD;  Location: [facility] OR;  Service: Gynecology;  Laterality: N/A;   ENDOMETRIAL ABLATION N/A 08/20/2019   Procedure: Minerva Ablation;  Surgeon: Allie Bossier, MD;  Location: [facility] OR;  Service: Gynecology;  Laterality: N/A;   EYE SURGERY Bilateral    laser right and cataract removed left eye   LEFT HEART CATH AND CORONARY ANGIOGRAPHY N/A 08/30/2021   Procedure: LEFT HEART CATH AND CORONARY ANGIOGRAPHY;  Surgeon: Elder Negus, MD;  Location: [facility] INVASIVE CV LAB;  Service: Cardiovascular;  Laterality: N/A;   RADIOLOGY WITH ANESTHESIA N/A 09/16/2019   Procedure: MRI WITH ANESTHESIA   L SPINE WITHOUT CONTRAST, T SPINE WITHOUT CONTRAST , CERVICAL WITHOUT CONTRAST;  Surgeon: Radiologist, Medication, MD;  Location: [facility] OR;  Service: Radiology;  Laterality: N/A;   TUBAL LIGATION     interval BTL   UPPER GI ENDOSCOPY  07/2017   Past Medical History:  Diagnosis Date   Anemia    Anxiety    Arthritis    knees, hands   Asthma    Chronic diastolic (congestive) heart failure (HCC)    COPD (chronic obstructive pulmonary disease) (HCC)    Diabetes mellitus (HCC)    DKA (diabetic ketoacidosis) (HCC)    Dysfunctional uterine bleeding    Gastritis    GERD (gastroesophageal reflux disease)    Hypertension    Neuromuscular disorder (HCC)    neuropathy feet   Seizures (HCC) 09/12/2017   pt  states r/t stress and blood sugar - no meds last one 4 months ago, not seen neurologist   Sickle cell trait (HCC)    Smoker    Vitamin D deficiency 10/2019   Wears glasses    BP (!) 142/82   Pulse (!) 104   Ht 5\' 4"  (1.626 m)   Wt 276 lb 6.4 oz (125.4 kg)   SpO2 97%   BMI 47.44 kg/m   Opioid Risk Score:   Fall Risk Score:  `1  Depression screen [facility] 2/9     06/01/2022  9:05 AM 05/03/2022    1:52 PM 03/17/2022    2:08 PM 03/03/2022    1:30 PM 01/03/2022   10:52 AM 12/08/2021   10:50 AM 11/11/2021    2:50 PM  Depression screen PHQ 2/9  Decreased Interest 0 0 0 1 3 2 1   Down, Depressed, Hopeless 0 0 0 1 2 2 1   PHQ - 2 Score 0 0 0 2 5 4 2   Altered sleeping   0   2   Tired, decreased energy   0   2   Change in appetite   0   0   Feeling bad or failure about yourself    0      Trouble concentrating   0   2   Moving slowly or fidgety/restless   0   0   Suicidal thoughts   0   0   PHQ-9 Score   0   10      Review of Systems  Constitutional: Negative.   HENT: Negative.    Eyes: Negative.   Respiratory: Negative.    Cardiovascular: Negative.   Gastrointestinal:  Positive for abdominal pain.  Endocrine: Negative.   Genitourinary: Negative.   Musculoskeletal:  Positive for back pain.  Skin: Negative.   Allergic/Immunologic: Negative.   Neurological: Negative.   Hematological: Negative.   Psychiatric/Behavioral:  Positive for sleep disturbance.       Objective:   Physical Exam Vitals and nursing note reviewed.  Constitutional:      Appearance: Normal appearance. She is obese.  Cardiovascular:     Rate and Rhythm: Normal rate and regular rhythm.  Pulmonary:     Effort: Pulmonary effort is normal.     Breath sounds: Normal breath sounds.  Musculoskeletal:     Cervical back: Normal range of motion and neck supple.     Comments: Normal Muscle Bulk and Muscle Testing Reveals:  Upper Extremities: Full ROM and Muscle Strength 5/5  Lumbar Paraspinal Tenderness:  L-3-L-5 Lower Extremities: Full ROM and Muscle Strength 5/5 Arises from Table with ease Narrow Based Gait     Skin:    General: Skin is warm and dry.  Neurological:     Mental Status: She is alert and oriented to person, place, and time.  Psychiatric:        Mood and Affect: Mood normal.        Behavior: Behavior normal.         Assessment & Plan:  1. Lumbar Radiculitis: Continue current medication regimen. Continue HEP as Tolerated.06/01/2022 2. Fibromyalgia/Polyneuropathy: Continue HEP as Tolerated. Continue current Medication regimen. Continue to Monitor. 06/01/2022 3. Bilateral Knee Pain: No complaints today. Continue to Monitor.06/01/2022 4. Chronic Pain Syndrome: Continue Oxycodone 10/325mg  one tablet 5 times  a day as needed for pain #150. We will continue the opioid monitoring program, this consists of regular clinic visits, examinations, urine drug screen, pill counts as well as use of 06/03/2022 Controlled Substance Reporting system. A 12 month History has been reviewed on the 06/03/2022 Controlled Substance Reporting System on 06/01/2022.  5. Tachycardia:  Cardiology Following. Continue to monitor.  06/01/2022 6. Muscle Spasm: Continue Tizanidine: Ms. Degenhart reports her PCP was in contact with Dr 06/03/2022 regarding prescribing the Tizanidine. This was discussed with Dr 06/03/2022, she agrees and wants to check her liver profile on her next schedule visit. We will continue to monitor.   F/U in 1 month       F/U in 1 month

## 2022-06-02 ENCOUNTER — Encounter: Payer: Medicare Other | Admitting: Registered Nurse

## 2022-06-02 NOTE — Progress Notes (Deleted)
Follow-up Visit   Date: 06/05/2022    Jill Shaw MRN: [id number] DOB: Mar 13, 1976    Jill Shaw is a 46 y.o. ***-handed Caucasian/*** female with *** returning to the clinic for follow-up of ***.  The patient was accompanied to the clinic by *** who also provides collateral information.    IMPRESSION/PLAN: ***  Return to clinic in ***  --------------------------------------------- History of present illness:  UPDATE ***:  Medications:  Current Outpatient Medications on File Prior to Visit  Medication Sig Dispense Refill   Accu-Chek Softclix Lancets lancets USE TO TEST AS DIRECTED UP TO FOUR TIMES DAILY. 100 each 0   acetaminophen (TYLENOL) 500 MG tablet Take by mouth.     albuterol (PROVENTIL) (2.5 MG/3ML) 0.083% nebulizer solution Inhale into the lungs.     albuterol (VENTOLIN HFA) 108 (90 Base) MCG/ACT inhaler Inhale 2 puffs into the lungs every 6 (six) hours as needed for wheezing or shortness of breath. 8 g 6   budesonide-formoterol (SYMBICORT) 160-4.5 MCG/ACT inhaler INHALE 2 PUFFS INTO THE LUNGS 2 (TWO) TIMES DAILY. 10.2 g 6   cetirizine (ZYRTEC) 10 MG tablet Take 1 tablet by mouth daily.     Dexlansoprazole (DEXILANT) 30 MG capsule DR Take 1 capsule (30 mg total) by mouth daily. 30 capsule 11   diclofenac Sodium (VOLTAREN) 1 % GEL APPLY 2 GRAMS TOPICALLY TO THE AFFECTED AREA FOUR TIMES DAILY 200 g 1   dicyclomine (BENTYL) 20 MG tablet Take 1 tablet (20 mg total) by mouth 2 (two) times daily. 20 tablet 0   empagliflozin (JARDIANCE) 25 MG TABS tablet Take 1 tablet (25 mg total) by mouth daily before breakfast. 90 tablet 3   fluticasone (FLONASE) 50 MCG/ACT nasal spray Place 2 sprays into both nostrils daily. 16 g 6   furosemide (LASIX) 40 MG tablet TAKE 1 TABLET(40 MG) BY MOUTH TWICE DAILY (Patient taking differently: Take 40 mg by mouth 2 (two) times daily.) 180 tablet 1   glucose blood (ACCU-CHEK GUIDE) test strip USE AS DIRECTED UP TO FOUR TIMES DAILY 100  strip 4   haloperidol (HALDOL) 5 MG tablet Take 1 tablet (5 mg total) by mouth every 8 (eight) hours as needed (For nausea/vomiting). 15 tablet 0   HUMALOG KWIKPEN 200 UNIT/ML KwikPen Inject 180 Units into the skin daily.     hydrochlorothiazide (MICROZIDE) 12.5 MG capsule TAKE 1 CAPSULE(12.5 MG) BY MOUTH DAILY (Patient taking differently: Take 12.5 mg by mouth daily.) 90 capsule 3   hydrOXYzine (ATARAX) 10 MG tablet Take 10 mg by mouth at bedtime.     insulin glargine, 2 Unit Dial, (TOUJEO MAX SOLOSTAR) 300 UNIT/ML Solostar Pen Inject 160 Units into the skin daily. 9 mL 2   Insulin Pen Needle 31G X 8 MM MISC 1 Device by Does not apply route in the morning, at noon, in the evening, and at bedtime. 400 each 3   isosorbide mononitrate (IMDUR) 30 MG 24 hr tablet TAKE 1 TABLET(30 MG) BY MOUTH DAILY 90 tablet 3   losartan (COZAAR) 100 MG tablet TAKE 1 TABLET BY MOUTH EVERY DAY 30 tablet 3   metoprolol succinate (TOPROL-XL) 100 MG 24 hr tablet Take 100 mg by mouth daily. Take with or immediately following a meal.     norethindrone (AYGESTIN) 5 MG tablet Take 2 tablets (10 mg total) by mouth 2 (two) times daily as needed. Take for 3-4 days at a time as needed for heavy vaginal bleeding 60 tablet 3   ondansetron (ZOFRAN)  4 MG tablet Take 1 tablet (4 mg total) by mouth every 8 (eight) hours as needed for nausea or vomiting. 30 tablet 1   oxyCODONE-acetaminophen (PERCOCET) 10-325 MG tablet Take 1 tablet by mouth 5 (five) times daily as needed for pain. 150 tablet 0   potassium chloride SA (KLOR-CON M) 20 MEQ tablet TAKE 1 TABLET(20 MEQ) BY MOUTH DAILY 30 tablet 6   rosuvastatin (CRESTOR) 20 MG tablet TAKE 1 TABLET(20 MG) BY MOUTH DAILY 90 tablet 0   SPIRIVA RESPIMAT 2.5 MCG/ACT AERS INHALE 2 PUFFS INTO THE LUNGS DAILY 4 g 6   spironolactone (ALDACTONE) 50 MG tablet TAKE 1 TABLET(50 MG) BY MOUTH DAILY 30 tablet 3   sucralfate (CARAFATE) 1 g tablet Take 1 tablet (1 g total) by mouth 4 (four) times daily -  with  meals and at bedtime. 90 tablet 0   tiZANidine (ZANAFLEX) 4 MG tablet Take 1 tablet (4 mg total) by mouth every 6 (six) hours as needed for muscle spasms. Do Not Fill Before 06/09/2022 30 tablet 0   No current facility-administered medications on file prior to visit.    Allergies:  Allergies  Allergen Reactions   Elavil [Amitriptyline] Other (See Comments)    Coma   Orudis [Ketoprofen] Nausea And Vomiting   Desyrel [Trazodone] Nausea And Vomiting   Aspirin Nausea Only   Motrin [Ibuprofen] Nausea And Vomiting   Naprosyn [Naproxen] Nausea And Vomiting   Neurontin [Gabapentin] Nausea And Vomiting and Other (See Comments)    upset stomach   Sulfa Antibiotics Nausea And Vomiting   Ultram [Tramadol] Nausea And Vomiting and Other (See Comments)    stomach upset   Victoza [Liraglutide] Nausea And Vomiting   Zegerid [Omeprazole-Sodium Bicarbonate] Nausea And Vomiting    Vital Signs:  There were no vitals taken for this visit.   General Medical Exam:   General:  Well appearing, comfortable  Eyes/ENT: see cranial nerve examination.   Neck:  No carotid bruits. Respiratory:  Clear to auscultation, good air entry bilaterally.   Cardiac:  Regular rate and rhythm, no murmur.   Ext:  No edema ***  Neurological Exam: MENTAL STATUS including orientation to time, place, person, recent and remote memory, attention span and concentration, language, and fund of knowledge is ***normal.  Speech is not dysarthric.  CRANIAL NERVES:  No visual field defects.  Pupils equal round and reactive to light.  Normal conjugate, extra-ocular eye movements in all directions of gaze.  No ptosis ***.  Face is symmetric. Palate elevates symmetrically.  Tongue is midline.  MOTOR:  Motor strength is 5/5 in all extremities, ***.  No atrophy, fasciculations or abnormal movements.  No pronator drift.  Tone is normal.    MSRs:  Reflexes are 2+/4 throughout ***.  SENSORY:  Intact to vibration throughout  ***.  COORDINATION/GAIT:  Normal finger-to- nose-finger.  Intact rapid alternating movements bilaterally.  Gait narrow based and stable.   Data:*** NCS/EMG of the left arm and leg 11/10/2020: The electrophysiologic findings are consistent with a sensorimotor axonal polyneuropathy affecting the left upper and lower extremities. A superimposed left carpal tunnel syndrome (moderate) was also likely, correlate clinically   Total time spent:  ***  Thank you for allowing me to participate in patient's care.  If I can answer any additional questions, I would be pleased to do so.    Sincerely,    Joeseph Verville K. Allena Katz, DO

## 2022-06-05 ENCOUNTER — Ambulatory Visit: Payer: Medicaid Other | Admitting: Neurology

## 2022-06-05 DIAGNOSIS — D649 Anemia, unspecified: Secondary | ICD-10-CM

## 2022-06-05 DIAGNOSIS — I5032 Chronic diastolic (congestive) heart failure: Secondary | ICD-10-CM

## 2022-06-05 DIAGNOSIS — E872 Acidosis, unspecified: Secondary | ICD-10-CM

## 2022-06-05 DIAGNOSIS — E119 Type 2 diabetes mellitus without complications: Secondary | ICD-10-CM

## 2022-06-05 DIAGNOSIS — R1084 Generalized abdominal pain: Secondary | ICD-10-CM

## 2022-06-05 DIAGNOSIS — E1143 Type 2 diabetes mellitus with diabetic autonomic (poly)neuropathy: Secondary | ICD-10-CM

## 2022-06-06 ENCOUNTER — Telehealth: Payer: Self-pay

## 2022-06-06 NOTE — Telephone Encounter (Signed)
Patient called in requesting early refill for muscle relaxer due to her going out of town advised to member that per Jacalyn Lefevre NP this Rx cannot be released until her blood results from her liver function comes back. Patient expressed understanding.

## 2022-06-07 ENCOUNTER — Telehealth: Payer: Self-pay | Admitting: *Deleted

## 2022-06-07 ENCOUNTER — Other Ambulatory Visit: Payer: Self-pay | Admitting: Nurse Practitioner

## 2022-06-07 LAB — HEPATIC FUNCTION PANEL
ALT: 8 IU/L (ref 0–32)
AST: 14 IU/L (ref 0–40)
Albumin: 4.3 g/dL (ref 3.9–4.9)
Alkaline Phosphatase: 107 IU/L (ref 44–121)
Bilirubin Total: <0.2 mg/dL (ref 0.0–1.2)
Bilirubin, Direct: <0.1 mg/dL (ref 0.00–0.40)
Total Protein: 6.9 g/dL (ref 6.0–8.5)

## 2022-06-07 LAB — TOXASSURE SELECT,+ANTIDEPR,UR

## 2022-06-07 MED ORDER — FAMOTIDINE 40 MG PO TABS: 40.0000 mg | ORAL_TABLET | Freq: Every evening | ORAL | 1 refills | Status: DC

## 2022-06-07 NOTE — Telephone Encounter (Signed)
No additional notes needed  

## 2022-06-07 NOTE — Telephone Encounter (Signed)
Urine drug screen is positive for THC and negative for her medications.  She had ##0 pills at the 06/01/22 visit but reported she had taken the med the same day as the test. This could not be true because it was negative for oxycodone or any metabolites of oxycodone.This indicates she was out of her medication early because her fill date was 05/03/22 and the appointment was 06/01/22 which was day 30 of the Rx. A warning letter will be sent for the [facility] and taking more medication than prescribed. It has been sent through MyChart and USPS.

## 2022-06-09 ENCOUNTER — Telehealth: Payer: Self-pay | Admitting: Pharmacist

## 2022-06-09 ENCOUNTER — Ambulatory Visit: Payer: Self-pay

## 2022-06-09 NOTE — Telephone Encounter (Signed)
Attempted to call patient for scheduled follow up phone call. Left voicemail for her to return my call at her convenience.   Catie Eppie Gibson, PharmD, [facility] [phone] \

## 2022-06-12 ENCOUNTER — Encounter (HOSPITAL_BASED_OUTPATIENT_CLINIC_OR_DEPARTMENT_OTHER): Payer: Medicare Other | Admitting: Physical Medicine and Rehabilitation

## 2022-06-12 DIAGNOSIS — G629 Polyneuropathy, unspecified: Secondary | ICD-10-CM | POA: Diagnosis not present

## 2022-06-12 DIAGNOSIS — M62838 Other muscle spasm: Secondary | ICD-10-CM | POA: Diagnosis not present

## 2022-06-12 MED ORDER — TIZANIDINE HCL 4 MG PO TABS: 4.0000 mg | ORAL_TABLET | Freq: Four times a day (QID) | ORAL | 3 refills | Status: DC | PRN

## 2022-06-12 NOTE — Telephone Encounter (Signed)
Received MyChart message back from patient. Called her, left voicemail asking her to return my call at her convenience.   Catie Eppie Gibson, PharmD, [facility] [phone]

## 2022-06-12 NOTE — Progress Notes (Signed)
Subjective:    Patient ID: Jill Shaw, female    DOB: [birth year], 46 y.o.   MRN: [id number]   HPI:   An audio/video tele-health visit is felt to be the most appropriate encounter for this patient at this time. This is a follow up tele-visit via phone. The patient is at home. MD is at office. Prior to scheduling this appointment, our staff discussed the limitations of evaluation and management by telemedicine and the availability of in-person appointments. The patient expressed understanding and agreed to proceed.    Jill Shaw is a 45 y.o. female who returns for follow-up of inflammatory gastritis, chronic pain, diabetic peripheral neuropathy, and insomnia.   1) Insomnia: -She continues to experience insomnia at night. Asks about Ambien and I discussed that this is an addictive medication and there are other safer options we can try first that can also help with her pain.  -She is currently not taking either of these medications, or Amitriptyline or Trazodone.  -She takes Requip for resltless legs but this does not help. -She is still sleeping very poorly -She does use screens before bed time  2) Back pain secondary to lumbar radiculitis.  -She states her pain is located in her lower back radiating into her bilateral lower extremities and bilateral knee pain. She denies falling. She rates her pain 9. Her current exercise regime is walking.  -Ms. Divis Morphine equivalent is 20.00 MME.  -She was hospitalized for serotonin syndrome after use of Savella and Cymbalta.  -Back pain has been severe -She requests a renewal of her handicap placard as she is unable to walk 200 feet without stopping to rest.  -Her pain has been better controlled with Norco -she would like to get another XR given the chronicity of her pain.  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  -she asks whether her oxycodone can be increased to 6 tablets per day. -She tolerated Qutenza well and this provided 2 weeks of good relief, benefit started right away.  -Her peripheral neuropathy is worse in her bilateral lower extremities, especially the dorsum and soles of both feet, including the toes.  -now present in her hands as well- she has been dropping objects due to her neuropathy -she is interested in following with a dietician -she is interested in medicines for her diabetes -she is trying to lose weight  -needs handicap placard  4) Right shoulder and arm pain -notes limited range of motion in her shoulder -does have pain radiating into right arm from neck and arm feels heavy at times  5) Inflammatory gastritis --patient says she has recently been diagnosed with this condition and pain has been severe. -she has her gastroenterologist recommended that she discuss with [place] increasing her Percocet to better control her pain -she is currently prescribed 3 Percocet per day but feels she would benefit from up to 5 per day    Pain Inventory Average Pain 9 Pain Right Now 9 My pain is sharp, burning, tingling and aching  In the last 24 hours, has pain interfered with the following? General activity 0 Relation with others 0 Enjoyment of life 5 What TIME of day is your pain at its worst? morning ,  daytime, evening and night Sleep (in general) Poor  Pain is worse with: walking, bending, sitting, inactivity, standing and some activites Pain improves with: heat/ice and medication Relief from Meds: 10  Family History  Problem Relation Age of Onset   Diabetes Mother    Hypertension Mother    Migraines Paternal Grandfather    Colon cancer Neg Hx    Esophageal cancer Neg Hx    Stomach cancer Neg Hx    Rectal cancer Neg Hx    Social  History   Socioeconomic History   Marital status: Legally Separated    Spouse name: Not on file   Number of children: 3   Years of education: Not on file   Highest education level: Not on file  Occupational History   Occupation: unemployed  Tobacco Use   Smoking status: Some Days    Packs/day: 0.25    Years: 26.00    Total pack years: 6.50    Types: Cigarettes    Last attempt to quit: 09/13/2020    Years since quitting: 1.7   Smokeless tobacco: Never   Tobacco comments:    4-5 cigarettes/day  Vaping Use   Vaping Use: Never used  Substance and Sexual Activity   Alcohol use: No   Drug use: No   Sexual activity: Not Currently    Birth control/protection: None  Other Topics Concern   Not on file  Social History Narrative   Right Handed   Lives in a one story apartment, but lives on the second floor   Drinks caffeine once in awhile   Social Determinants of Health   Financial Resource Strain: Not on file  Food Insecurity: Food Insecurity Present (12/08/2021)   Hunger Vital Sign    Worried About Running Out of Food in the Last Year: Never true    Ran Out of Food in the Last Year: Sometimes true  Transportation Needs: No Transportation Needs (12/08/2021)   PRAPARE - Administrator, Civil Service (Medical): No    Lack of Transportation (Non-Medical): No  Physical Activity: Not on file  Stress: Not on file  Social Connections: Not on file   Past Surgical History:  Procedure Laterality Date   CESAREAN SECTION     x 1. for twins   DILATION AND CURETTAGE OF UTERUS N/A 08/20/2019   Procedure: DILATATION AND CURETTAGE;  Surgeon: Allie Bossier, MD;  Location: [facility] OR;  Service: Gynecology;  Laterality: N/A;   ENDOMETRIAL ABLATION N/A 08/20/2019   Procedure: Minerva Ablation;  Surgeon: Allie Bossier, MD;  Location: [facility] OR;  Service: Gynecology;  Laterality: N/A;   EYE SURGERY Bilateral    laser right and cataract removed left eye   LEFT HEART CATH AND CORONARY ANGIOGRAPHY  N/A 08/30/2021   Procedure: LEFT HEART CATH AND CORONARY ANGIOGRAPHY;  Surgeon: Elder Negus, MD;  Location: [facility] INVASIVE [facility];  Service: Cardiovascular;  Laterality: N/A;   RADIOLOGY WITH ANESTHESIA N/A 09/16/2019   Procedure: MRI WITH ANESTHESIA   L SPINE WITHOUT CONTRAST, T SPINE WITHOUT CONTRAST , CERVICAL WITHOUT CONTRAST;  Surgeon: Radiologist, Medication, MD;  Location: [facility] OR;  Service: Radiology;  Laterality: N/A;   TUBAL LIGATION     interval BTL   UPPER GI ENDOSCOPY  07/2017   Past Surgical History:  Procedure Laterality Date   CESAREAN SECTION     x 1. for twins   DILATION AND CURETTAGE OF UTERUS N/A 08/20/2019   Procedure: DILATATION AND CURETTAGE;  Surgeon: Nicholaus Bloom  C, MD;  Location: [place];  Service: Gynecology;  Laterality: N/A;   ENDOMETRIAL ABLATION N/A 08/20/2019   Procedure: Minerva Ablation;  Surgeon: Emily Filbert, MD;  Location: [place];  Service: Gynecology;  Laterality: N/A;   EYE SURGERY Bilateral    laser right and cataract removed left eye   LEFT HEART CATH AND CORONARY ANGIOGRAPHY N/A 08/30/2021   Procedure: LEFT HEART CATH AND CORONARY ANGIOGRAPHY;  Surgeon: Nigel Mormon, MD;  Location: [place] CV LAB;  Service: Cardiovascular;  Laterality: N/A;   RADIOLOGY WITH ANESTHESIA N/A 09/16/2019   Procedure: MRI WITH ANESTHESIA   L SPINE WITHOUT CONTRAST, T SPINE WITHOUT CONTRAST , CERVICAL WITHOUT CONTRAST;  Surgeon: Radiologist, Medication, MD;  Location: [place];  Service: Radiology;  Laterality: N/A;   TUBAL LIGATION     interval BTL   UPPER GI ENDOSCOPY  07/2017   Past Medical History:  Diagnosis Date   Anemia    Anxiety    Arthritis    knees, hands   Asthma    Chronic diastolic (congestive) heart failure (HCC)    COPD (chronic obstructive pulmonary disease) (HCC)    Diabetes mellitus ([place])    DKA (diabetic ketoacidosis) (HCC)    Dysfunctional uterine bleeding    Gastritis    GERD (gastroesophageal reflux disease)    Hypertension     Neuromuscular disorder ([place])    neuropathy feet   Seizures ([place]) 09/12/2017   pt states r/t stress and blood sugar - no meds last one 4 months ago, not seen neurologist   Sickle cell trait ([place])    Smoker    Vitamin D deficiency 10/2019   Wears glasses    There were no vitals taken for this visit.  Opioid Risk Score:   Fall Risk Score:  `1  Depression screen PHQ 2/9     06/01/2022    9:05 AM 05/03/2022    1:52 PM 03/17/2022    2:08 PM 03/03/2022    1:30 PM 01/03/2022   10:52 AM 12/08/2021   10:50 AM 11/11/2021    2:50 PM  Depression screen PHQ 2/9  Decreased Interest 0 0 0 1 3 2 1   Down, Depressed, Hopeless 0 0 0 1 2 2 1   PHQ - 2 Score 0 0 0 2 5 4 2   Altered sleeping   0   2   Tired, decreased energy   0   2   Change in appetite   0   0   Feeling bad or failure about yourself    0      Trouble concentrating   0   2   Moving slowly or fidgety/restless   0   0   Suicidal thoughts   0   0   PHQ-9 Score   0   10      Review of Systems  Constitutional:  Positive for diaphoresis and unexpected weight change.  Respiratory:  Positive for shortness of breath and wheezing.   Gastrointestinal:  Positive for abdominal pain, constipation and nausea.  Musculoskeletal:  Positive for arthralgias, back pain, gait problem and myalgias.  Neurological:  Positive for dizziness, weakness and numbness.  Hematological:  Bruises/bleeds easily.  Psychiatric/Behavioral:  Positive for confusion and decreased concentration. The patient is nervous/anxious.   All other systems reviewed and are negative.      Objective:  Not performed as patient was seen via phone visit.      Assessment & Plan:  1. Lumbar Radiculitis: Continue  current medication regimen. Continue HEP as Tolerated. Provided with handicap placard. XR ordered. Note for work provided via Clinical cytogeneticist.  2. Fibromyalgia: Continue HEP as Tolerated. Continue current Medication regimen. Continue to Monitor.  3. Bilateral Knee Pain: Right knee  is currently worst, XR ordered and will call with results.  Continue to Monitor.  4. Chronic Pain Syndrome: increase Percocet to 10 mg up to 6 times per day when next refill is due. Discussed that when initially increasing medication frequency she should increase to 4 tablets for at least 2 weeks before increasing any further due to risks of respiratory depression with Percocet and she is agreeable to this. Discussed with Jacalyn Lefevre, NP.  -provided note explaining her inability to work due to her pain 5. Insomnia: -Try to go outside near sunrise -Get exercise during the day.  -Discussed good sleep hygiene: turning off all devices an hour before bedtime.  -Chamomile tea with dinner.  -Melatonin did for help. -warm bath or shower before bed. -apply lavender oil for forehead at night 6. Diabetic peripheral neuropathy -checked with Qutenza rep and medicaid/medicare will not cover Qutenza more frequently than every 3 months -will request 6 patches next visit to cover her hands as well -Discussed Qutenza as an option for neuropathic pain control. Discussed that this is a capsaicin patch, stronger than capsaicin cream. Discussed that it is currently approved for diabetic peripheral neuropathy and post-herpetic neuralgia, but that it has also shown benefit in treating other forms of neuropathy. Provided patient with link to site to learn more about the patch: https://www.clark.biz/. Discussed that the patch would be placed in office and benefits usually last 3 months. Discussed that unintended exposure to capsaicin can cause severe irritation of eyes, mucous membranes, respiratory tract, and skin, but that Qutenza is a local treatment and does not have the systemic side effects of other nerve medications. Discussed that there may be pain, itching, erythema, and decreased sensory function associated with the application of Qutenza. Side effects usually subside within 1 week. A cold pack of analgesic  medications can help with these side effects. Blood pressure can also be increased due to pain associated with administration of the patch.   7. Right sided shoulder and arm pain -discussed that could be a component or frozen shoulder and/or cervical radiculitis -will obtain right shoulder XR and cervical spine XR and call when results are available  8. Obesity: -Educated that current weight is 278 lbs and current BMI is 47.72 -Educated regarding health benefits of weight loss- for pain, general health, chronic disease prevention, immune health, mental health.  -Will monitor weight every visit.  -Consider [place] tea daily.  -Discussed the benefits of intermittent fasting. -Discussed foods that can assist in weight loss: 1) leafy greens- high in fiber and nutrients 2) dark chocolate- improves metabolism (if prefer sweetened, best to sweeten with honey instead of sugar).  3) cruciferous vegetables- high in fiber and protein 4) full fat yogurt: high in healthy fat, protein, calcium, and probiotics 5) apples- high in a variety of phytochemicals 6) nuts- high in fiber and protein that increase feelings of fullness 7) grapefruit: rich in nutrients, antioxidants, and fiber (not to be taken with anticoagulation) 8) beans- high in protein and fiber 9) salmon- has high quality protein and healthy fats 10) green tea- rich in polyphenols 11) eggs- rich in choline and vitamin D 12) tuna- high protein, boosts metabolism 13) avocado- decreases visceral abdominal fat 14) chicken (pasture raised): high in protein and iron 15)  blueberries- reduce abdominal fat and cholesterol 16) whole grains- decreases calories retained during digestion, speeds metabolism 17) chia seeds- curb appetite 18) chilies- increases fat metabolism  -Discussed supplements that can be used:  1) Metatrim 400mg  BID 30 minutes before breakfast and dinner  2) Sphaeranthus indicus and Garcinia mangostana (combinations of these and  #1 can be found in capsicum and zychrome  3) green coffee bean extract 400mg  twice per day or Irvingia (african mango) 150 to 300mg  twice per day.  5 minutes spent in discussion of her pain, that we can increase her Percocet when she is due for her next refill, that we can increase Qutenza to 6 patches next visit.

## 2022-06-14 NOTE — Telephone Encounter (Signed)
Attempted to call patient again in response to prior MyChart message. Left voicemail for her to return my call at her convenience.   Catie Eppie Gibson, PharmD, [facility] [phone]

## 2022-06-15 ENCOUNTER — Encounter (HOSPITAL_BASED_OUTPATIENT_CLINIC_OR_DEPARTMENT_OTHER): Payer: Medicare Other | Admitting: Physical Medicine and Rehabilitation

## 2022-06-15 DIAGNOSIS — K219 Gastro-esophageal reflux disease without esophagitis: Secondary | ICD-10-CM | POA: Diagnosis not present

## 2022-06-16 NOTE — Progress Notes (Signed)
Subjective:    Patient ID: Jill HighmanJulie M Shaw, female    DOB: Mar 23, 1976, 46 y.o.   MRN: [id number]   HPI:   An audio/video tele-health visit is felt to be the most appropriate encounter for this patient at this time. This is a follow up tele-visit via phone. The patient is at home. MD is at office. Prior to scheduling this appointment, our staff discussed the limitations of evaluation and management by telemedicine and the availability of in-person appointments. The patient expressed understanding and agreed to proceed.    Jill HighmanJulie M Shaw is a 46 y.o. female who returns for f/u of inflammatory gastritis, chronic pain, diabetic peripheral neuropathy, and insomnia.   1) Insomnia: -She continues to experience insomnia at night. Asks about Ambien and I discussed that this is an addictive medication and there are other safer options we can try first that can also help with her pain.  -She is currently not taking either of these medications, or Amitriptyline or Trazodone.  -She takes Requip for resltless legs but this does not help. -She is still sleeping very poorly -She does use screens before bed time  2) Back pain secondary to lumbar radiculitis.  -She states her pain is located in her lower back radiating into her bilateral lower extremities and bilateral knee pain. She denies falling. She rates her pain 9. Her current exercise regime is walking.  -Ms. Tiedt Morphine equivalent is 20.00 MME.  -She was hospitalized for serotonin syndrome after use of Savella and Cymbalta.  -Back pain has been severe -She requests a renewal of her handicap placard as she is unable to walk 200 feet without stopping to rest.  -Her pain has been better controlled with Norco -she would like to get another XR given the chronicity of her pain.  -unable to work due to the severity of her pain -she used to work in [employer] as Conservation officer, naturecashier and in other roles as needed  3) Diabetic peripheral neuropathy -she asks  whether Qutenza can be administered more frequently than every 3 months -she asks whether Qutenza can be applied on her hands as well.  -she asks whether her oxycodone can be increased to 6 tablets per day. -She tolerated Qutenza well and this provided 2 weeks of good relief, benefit started right away.  -Her peripheral neuropathy is worse in her bilateral lower extremities, especially the dorsum and soles of both feet, including the toes.  -now present in her hands as well- she has been dropping objects due to her neuropathy -she is interested in following with a dietician -she is interested in medicines for her diabetes -she is trying to lose weight  -needs handicap placard  4) Right shoulder and arm pain -notes limited range of motion in her shoulder -does have pain radiating into right arm from neck and arm feels heavy at times  5) Inflammatory gastritis --patient says she has recently been diagnosed with this condition and pain has been severe. -she has her gastroenterologist recommended that she discuss with us increasing her Percocet to better control her pain -she is currently prescribed 3 Percocet per day but feels she would benefit from up to 5 per day  6) GERD -she can have severe reflex at times -she was started on pepcid for a short time period to help with this.   Pain Inventory Average Pain 9 Pain Right Now 9 My pain is sharp, burning, tingling and aching  In the last 24 hours, has pain interfered with the following?  General activity 0 Relation with others 0 Enjoyment of life 5 What TIME of day is your pain at its worst? morning , daytime, evening and night Sleep (in general) Poor  Pain is worse with: walking, bending, sitting, inactivity, standing and some activites Pain improves with: heat/ice and medication Relief from Meds: 10  Family History  Problem Relation Age of Onset   Diabetes Mother    Hypertension Mother    Migraines Paternal Grandfather     Colon cancer Neg Hx    Esophageal cancer Neg Hx    Stomach cancer Neg Hx    Rectal cancer Neg Hx    Social History   Socioeconomic History   Marital status: Legally Separated    Spouse name: Not on file   Number of children: 3   Years of education: Not on file   Highest education level: Not on file  Occupational History   Occupation: unemployed  Tobacco Use   Smoking status: Some Days    Packs/day: 0.25    Years: 26.00    Total pack years: 6.50    Types: Cigarettes    Last attempt to quit: 09/13/2020    Years since quitting: 1.7   Smokeless tobacco: Never   Tobacco comments:    4-5 cigarettes/day  Vaping Use   Vaping Use: Never used  Substance and Sexual Activity   Alcohol use: No   Drug use: No   Sexual activity: Not Currently    Birth control/protection: None  Other Topics Concern   Not on file  Social History Narrative   Right Handed   Lives in a one story apartment, but lives on the second floor   Drinks caffeine once in awhile   Social Determinants of Health   Financial Resource Strain: Not on file  Food Insecurity: Food Insecurity Present (12/08/2021)   Hunger Vital Sign    Worried About Running Out of Food in the Last Year: Never true    Ran Out of Food in the Last Year: Sometimes true  Transportation Needs: No Transportation Needs (12/08/2021)   PRAPARE - Administrator, Civil Service (Medical): No    Lack of Transportation (Non-Medical): No  Physical Activity: Not on file  Stress: Not on file  Social Connections: Not on file   Past Surgical History:  Procedure Laterality Date   CESAREAN SECTION     x 1. for twins   DILATION AND CURETTAGE OF UTERUS N/A 08/20/2019   Procedure: DILATATION AND CURETTAGE;  Surgeon: Allie Bossier, MD;  Location: [facility] OR;  Service: Gynecology;  Laterality: N/A;   ENDOMETRIAL ABLATION N/A 08/20/2019   Procedure: Minerva Ablation;  Surgeon: Allie Bossier, MD;  Location: [facility] OR;  Service: Gynecology;  Laterality: N/A;    EYE SURGERY Bilateral    laser right and cataract removed left eye   LEFT HEART CATH AND CORONARY ANGIOGRAPHY N/A 08/30/2021   Procedure: LEFT HEART CATH AND CORONARY ANGIOGRAPHY;  Surgeon: Elder Negus, MD;  Location: [facility];  Service: Cardiovascular;  Laterality: N/A;   RADIOLOGY WITH ANESTHESIA N/A 09/16/2019   Procedure: MRI WITH ANESTHESIA   L SPINE WITHOUT CONTRAST, T SPINE WITHOUT CONTRAST , CERVICAL WITHOUT CONTRAST;  Surgeon: Radiologist, Medication, MD;  Location: [facility] OR;  Service: Radiology;  Laterality: N/A;   TUBAL LIGATION     interval BTL   UPPER GI ENDOSCOPY  07/2017   Past Surgical History:  Procedure Laterality Date   CESAREAN SECTION  x 1. for twins   DILATION AND CURETTAGE OF UTERUS N/A 08/20/2019   Procedure: DILATATION AND CURETTAGE;  Surgeon: Allie Bossier, MD;  Location: [facility];  Service: Gynecology;  Laterality: N/A;   ENDOMETRIAL ABLATION N/A 08/20/2019   Procedure: Minerva Ablation;  Surgeon: Allie Bossier, MD;  Location: [facility];  Service: Gynecology;  Laterality: N/A;   EYE SURGERY Bilateral    laser right and cataract removed left eye   LEFT HEART CATH AND CORONARY ANGIOGRAPHY N/A 08/30/2021   Procedure: LEFT HEART CATH AND CORONARY ANGIOGRAPHY;  Surgeon: Elder Negus, MD;  Location: [facility];  Service: Cardiovascular;  Laterality: N/A;   RADIOLOGY WITH ANESTHESIA N/A 09/16/2019   Procedure: MRI WITH ANESTHESIA   L SPINE WITHOUT CONTRAST, T SPINE WITHOUT CONTRAST , CERVICAL WITHOUT CONTRAST;  Surgeon: Radiologist, Medication, MD;  Location: [facility];  Service: Radiology;  Laterality: N/A;   TUBAL LIGATION     interval BTL   UPPER GI ENDOSCOPY  07/2017   Past Medical History:  Diagnosis Date   Anemia    Anxiety    Arthritis    knees, hands   Asthma    Chronic diastolic (congestive) heart failure (HCC)    COPD (chronic obstructive pulmonary disease) (HCC)    Diabetes mellitus (HCC)    DKA (diabetic ketoacidosis) (HCC)     Dysfunctional uterine bleeding    Gastritis    GERD (gastroesophageal reflux disease)    Hypertension    Neuromuscular disorder (HCC)    neuropathy feet   Seizures (HCC) 09/12/2017   pt states r/t stress and blood sugar - no meds last one 4 months ago, not seen neurologist   Sickle cell trait (HCC)    Smoker    Vitamin D deficiency 10/2019   Wears glasses    There were no vitals taken for this visit.  Opioid Risk Score:   Fall Risk Score:  `1  Depression screen PHQ 2/9     06/01/2022    9:05 AM 05/03/2022    1:52 PM 03/17/2022    2:08 PM 03/03/2022    1:30 PM 01/03/2022   10:52 AM 12/08/2021   10:50 AM 11/11/2021    2:50 PM  Depression screen PHQ 2/9  Decreased Interest 0 0 0 1 3 2 1   Down, Depressed, Hopeless 0 0 0 1 2 2 1   PHQ - 2 Score 0 0 0 2 5 4 2   Altered sleeping   0   2   Tired, decreased energy   0   2   Change in appetite   0   0   Feeling bad or failure about yourself    0      Trouble concentrating   0   2   Moving slowly or fidgety/restless   0   0   Suicidal thoughts   0   0   PHQ-9 Score   0   10      Review of Systems  Constitutional:  Positive for diaphoresis and unexpected weight change.  Respiratory:  Positive for shortness of breath and wheezing.   Gastrointestinal:  Positive for abdominal pain, constipation and nausea.  Musculoskeletal:  Positive for arthralgias, back pain, gait problem and myalgias.  Neurological:  Positive for dizziness, weakness and numbness.  Hematological:  Bruises/bleeds easily.  Psychiatric/Behavioral:  Positive for confusion and decreased concentration. The patient is nervous/anxious.   All other systems reviewed and are negative.      Objective:  Not performed as patient was seen via phone visit.      Assessment & Plan:  1. Lumbar Radiculitis: Continue current medication regimen. Continue HEP as Tolerated. Provided with handicap placard. XR ordered. Note for work provided via Clinical cytogeneticist.  2. Fibromyalgia: Continue HEP  as Tolerated. Continue current Medication regimen. Continue to Monitor.  3. Bilateral Knee Pain: Right knee is currently worst, XR ordered and will call with results.  Continue to Monitor.  4. Chronic Pain Syndrome: increase Percocet to 10 mg up to 6 times per day when next refill is due. Discussed that when initially increasing medication frequency she should increase to 4 tablets for at least 2 weeks before increasing any further due to risks of respiratory depression with Percocet and she is agreeable to this. Discussed with Jacalyn Lefevre, NP.  -provided note explaining her inability to work due to her pain 5. Insomnia: -Try to go outside near sunrise -Get exercise during the day.  -Discussed good sleep hygiene: turning off all devices an hour before bedtime.  -Chamomile tea with dinner.  -Melatonin did for help. -warm bath or shower before bed. -apply lavender oil for forehead at night 6. Diabetic peripheral neuropathy -checked with Qutenza rep and medicaid/medicare will not cover Qutenza more frequently than every 3 months -will request 6 patches next visit to cover her hands as well -Discussed Qutenza as an option for neuropathic pain control. Discussed that this is a capsaicin patch, stronger than capsaicin cream. Discussed that it is currently approved for diabetic peripheral neuropathy and post-herpetic neuralgia, but that it has also shown benefit in treating other forms of neuropathy. Provided patient with link to site to learn more about the patch: https://www.clark.biz/. Discussed that the patch would be placed in office and benefits usually last 3 months. Discussed that unintended exposure to capsaicin can cause severe irritation of eyes, mucous membranes, respiratory tract, and skin, but that Qutenza is a local treatment and does not have the systemic side effects of other nerve medications. Discussed that there may be pain, itching, erythema, and decreased sensory function associated  with the application of Qutenza. Side effects usually subside within 1 week. A cold pack of analgesic medications can help with these side effects. Blood pressure can also be increased due to pain associated with administration of the patch.   7. Right sided shoulder and arm pain -discussed that could be a component or frozen shoulder and/or cervical radiculitis -will obtain right shoulder XR and cervical spine XR and call when results are available  8. Obesity: -Educated that current weight is 278 lbs and current BMI is 47.72 -Educated regarding health benefits of weight loss- for pain, general health, chronic disease prevention, immune health, mental health.  -Will monitor weight every visit.  -Consider Roobois tea daily.  -Discussed the benefits of intermittent fasting. -Discussed foods that can assist in weight loss: 1) leafy greens- high in fiber and nutrients 2) dark chocolate- improves metabolism (if prefer sweetened, best to sweeten with honey instead of sugar).  3) cruciferous vegetables- high in fiber and protein 4) full fat yogurt: high in healthy fat, protein, calcium, and probiotics 5) apples- high in a variety of phytochemicals 6) nuts- high in fiber and protein that increase feelings of fullness 7) grapefruit: rich in nutrients, antioxidants, and fiber (not to be taken with anticoagulation) 8) beans- high in protein and fiber 9) salmon- has high quality protein and healthy fats 10) green tea- rich in polyphenols 11) eggs- rich in choline and vitamin D  12) tuna- high protein, boosts metabolism 13) avocado- decreases visceral abdominal fat 14) chicken (pasture raised): high in protein and iron 15) blueberries- reduce abdominal fat and cholesterol 16) whole grains- decreases calories retained during digestion, speeds metabolism 17) chia seeds- curb appetite 18) chilies- increases fat metabolism  -Discussed supplements that can be used:  1) Metatrim 400mg  BID 30 minutes  before breakfast and dinner  2) Sphaeranthus indicus and Garcinia mangostana (combinations of these and #1 can be found in capsicum and zychrome  3) green coffee bean extract 400mg  twice per day or Irvingia (african mango) 150 to 300mg  twice per day.  9) Gastroesophageal reflux disease  -discussed the interaction between Tizanidine and Pepcid and that their additive effects could cause hypotension and bradycardia. -recommended eating small meals -recommended avoiding acidic and spicy foods -recommended apple cider vinegar in a cup of water before meals.   5 minutes spent in discussion of her GERD, the interaction between Tizanidine and Pepcid, and natural techniques she can use to decrease her GERD risk

## 2022-06-20 ENCOUNTER — Ambulatory Visit: Payer: Medicaid Other | Admitting: Advanced Practice Midwife

## 2022-06-21 ENCOUNTER — Ambulatory Visit: Payer: Self-pay | Admitting: Nurse Practitioner

## 2022-06-21 ENCOUNTER — Other Ambulatory Visit: Payer: Self-pay | Admitting: Physical Medicine and Rehabilitation

## 2022-06-21 ENCOUNTER — Telehealth: Payer: Self-pay | Admitting: Physical Medicine and Rehabilitation

## 2022-06-21 MED ORDER — BACLOFEN 10 MG PO TABS: 10.0000 mg | ORAL_TABLET | Freq: Three times a day (TID) | ORAL | 0 refills | Status: DC | PRN

## 2022-06-21 NOTE — Telephone Encounter (Signed)
Spoke with patient. Phone appointment scheduled tomorrow.   Catie Eppie Gibson, PharmD, [facility] [phone]

## 2022-06-21 NOTE — Telephone Encounter (Signed)
Patient would like to know if she can increase her Tizanidine, having more spasms.  Please advise.

## 2022-06-22 ENCOUNTER — Encounter: Payer: Medicare Other | Admitting: Registered Nurse

## 2022-06-22 ENCOUNTER — Other Ambulatory Visit: Payer: Medicaid Other | Admitting: Pharmacist

## 2022-06-22 ENCOUNTER — Telehealth: Payer: Self-pay | Admitting: Pharmacist

## 2022-06-22 NOTE — Progress Notes (Signed)
Attempted to contact patient for scheduled appointment for medication management. Left HIPAA compliant message for patient to return my call at their convenience.   Catie T. Kacee Koren, PharmD, BCACP [facility] [phone]  

## 2022-06-23 ENCOUNTER — Encounter (HOSPITAL_COMMUNITY): Payer: Self-pay | Admitting: *Deleted

## 2022-06-23 ENCOUNTER — Emergency Department (HOSPITAL_COMMUNITY)
Admission: EM | Admit: 2022-06-23 | Discharge: 2022-06-23 | Disposition: A | Discharge status: Home / Self Care | Payer: Medicare Other | Attending: Emergency Medicine | Admitting: Emergency Medicine

## 2022-06-23 ENCOUNTER — Other Ambulatory Visit: Payer: Self-pay

## 2022-06-23 ENCOUNTER — Emergency Department (HOSPITAL_COMMUNITY): Payer: Medicare Other

## 2022-06-23 DIAGNOSIS — J45909 Unspecified asthma, uncomplicated: Secondary | ICD-10-CM | POA: Diagnosis not present

## 2022-06-23 DIAGNOSIS — E119 Type 2 diabetes mellitus without complications: Secondary | ICD-10-CM | POA: Diagnosis not present

## 2022-06-23 DIAGNOSIS — Z794 Long term (current) use of insulin: Secondary | ICD-10-CM | POA: Diagnosis not present

## 2022-06-23 DIAGNOSIS — R112 Nausea with vomiting, unspecified: Secondary | ICD-10-CM

## 2022-06-23 DIAGNOSIS — I509 Heart failure, unspecified: Secondary | ICD-10-CM | POA: Insufficient documentation

## 2022-06-23 DIAGNOSIS — Z79899 Other long term (current) drug therapy: Secondary | ICD-10-CM | POA: Diagnosis not present

## 2022-06-23 DIAGNOSIS — J449 Chronic obstructive pulmonary disease, unspecified: Secondary | ICD-10-CM | POA: Insufficient documentation

## 2022-06-23 DIAGNOSIS — Z7951 Long term (current) use of inhaled steroids: Secondary | ICD-10-CM | POA: Insufficient documentation

## 2022-06-23 DIAGNOSIS — R6883 Chills (without fever): Secondary | ICD-10-CM | POA: Diagnosis not present

## 2022-06-23 DIAGNOSIS — M791 Myalgia, unspecified site: Secondary | ICD-10-CM | POA: Diagnosis not present

## 2022-06-23 DIAGNOSIS — N12 Tubulo-interstitial nephritis, not specified as acute or chronic: Secondary | ICD-10-CM | POA: Diagnosis not present

## 2022-06-23 DIAGNOSIS — Z7984 Long term (current) use of oral hypoglycemic drugs: Secondary | ICD-10-CM | POA: Insufficient documentation

## 2022-06-23 LAB — COMPREHENSIVE METABOLIC PANEL
ALT: 11 U/L (ref 0–44)
AST: 16 U/L (ref 15–41)
Albumin: 3.9 g/dL (ref 3.5–5.0)
Alkaline Phosphatase: 80 U/L (ref 38–126)
Anion gap: 14 (ref 5–15)
BUN: 6 mg/dL (ref 6–20)
CO2: 21 mmol/L — ABNORMAL LOW (ref 22–32)
Calcium: 9.7 mg/dL (ref 8.9–10.3)
Chloride: 105 mmol/L (ref 98–111)
Creatinine, Ser: 1.06 mg/dL — ABNORMAL HIGH (ref 0.44–1.00)
GFR, Estimated: >60 mL/min (ref >60)
Glucose, Bld: 202 mg/dL — ABNORMAL HIGH (ref 70–99)
Potassium: 3.3 mmol/L — ABNORMAL LOW (ref 3.5–5.1)
Sodium: 140 mmol/L (ref 135–145)
Total Bilirubin: 0.8 mg/dL (ref 0.3–1.2)
Total Protein: 6.9 g/dL (ref 6.5–8.1)

## 2022-06-23 LAB — CBC
HCT: 37.5 % (ref 36.0–46.0)
Hemoglobin: 12.7 g/dL (ref 12.0–15.0)
MCH: 28 pg (ref 26.0–34.0)
MCHC: 33.9 g/dL (ref 30.0–36.0)
MCV: 82.8 fL (ref 80.0–100.0)
Platelets: 253 10*3/uL (ref 150–400)
RBC: 4.53 MIL/uL (ref 3.87–5.11)
RDW: 16.2 % — ABNORMAL HIGH (ref 11.5–15.5)
WBC: 11.2 10*3/uL — ABNORMAL HIGH (ref 4.0–10.5)
nRBC: 0 % (ref 0.0–0.2)

## 2022-06-23 LAB — URINALYSIS, ROUTINE W REFLEX MICROSCOPIC
Bilirubin Urine: NEGATIVE
Glucose, UA: 150 mg/dL — AB
Ketones, ur: 5 mg/dL — AB
Nitrite: NEGATIVE
Protein, ur: 100 mg/dL — AB
RBC / HPF: >50 RBC/hpf — ABNORMAL HIGH (ref 0–5)
Specific Gravity, Urine: 1.019 (ref 1.005–1.030)
Squamous Epithelial / HPF: >50 — ABNORMAL HIGH (ref 0–5)
pH: 5 (ref 5.0–8.0)

## 2022-06-23 LAB — I-STAT BETA HCG BLOOD, ED (MC, WL, AP ONLY): I-stat hCG, quantitative: <5 m[IU]/mL (ref <5)

## 2022-06-23 LAB — LIPASE, BLOOD: Lipase: 21 U/L (ref 11–51)

## 2022-06-23 MED ORDER — IOHEXOL 300 MG/ML  SOLN
100.0000 mL | Freq: Once | INTRAMUSCULAR | Status: AC | PRN
  Administered 2022-06-23: 100 mL via INTRAVENOUS

## 2022-06-23 MED ORDER — CEFDINIR 300 MG PO CAPS: 300.0000 mg | ORAL_CAPSULE | Freq: Two times a day (BID) | ORAL | 0 refills | Status: DC

## 2022-06-23 MED ORDER — OXYCODONE-ACETAMINOPHEN 5-325 MG PO TABS
1.0000 | ORAL_TABLET | ORAL | Status: DC | PRN
  Administered 2022-06-23: 1 via ORAL
  Filled 2022-06-23: qty 1

## 2022-06-23 MED ORDER — SODIUM CHLORIDE 0.9 % IV BOLUS
1000.0000 mL | Freq: Once | INTRAVENOUS | Status: AC
  Administered 2022-06-23: 1000 mL via INTRAVENOUS

## 2022-06-23 MED ORDER — ONDANSETRON HCL 4 MG/2ML IJ SOLN
4.0000 mg | Freq: Once | INTRAMUSCULAR | Status: AC
Start: 2022-06-23 — End: 2022-06-23
  Administered 2022-06-23: 4 mg via INTRAVENOUS
  Filled 2022-06-23: qty 2

## 2022-06-23 MED ORDER — HYDROMORPHONE HCL 1 MG/ML IJ SOLN
0.5000 mg | Freq: Once | INTRAMUSCULAR | Status: AC
  Administered 2022-06-23: 0.5 mg via INTRAVENOUS
  Filled 2022-06-23: qty 1

## 2022-06-23 MED ORDER — POTASSIUM CHLORIDE CRYS ER 20 MEQ PO TBCR
40.0000 meq | EXTENDED_RELEASE_TABLET | Freq: Once | ORAL | Status: AC
  Administered 2022-06-23: 40 meq via ORAL
  Filled 2022-06-23: qty 2

## 2022-06-23 MED ORDER — ONDANSETRON 4 MG PO TBDP
4.0000 mg | ORAL_TABLET | Freq: Once | ORAL | Status: AC
  Administered 2022-06-23: 4 mg via ORAL
  Filled 2022-06-23: qty 1

## 2022-06-23 MED ORDER — SODIUM CHLORIDE 0.9 % IV SOLN
1.0000 g | Freq: Once | INTRAVENOUS | Status: AC
  Administered 2022-06-23: 1 g via INTRAVENOUS
  Filled 2022-06-23: qty 10

## 2022-06-23 MED ORDER — HYDROMORPHONE HCL 1 MG/ML IJ SOLN
1.0000 mg | Freq: Once | INTRAMUSCULAR | Status: AC
  Administered 2022-06-23: 1 mg via INTRAVENOUS
  Filled 2022-06-23: qty 1

## 2022-06-23 NOTE — Discharge Instructions (Signed)
Please return to ED with any new or worsening signs or symptoms such as fevers measured with thermometer, decreased urine output Please read attached guide on kidney infection Please begin taking antibiotic I have prescribed starting tomorrow Please continue to hydrate yourself as an outpatient

## 2022-06-23 NOTE — ED Triage Notes (Signed)
Pt c/o generalized abd pain with NV x 4 days. Reports chills.

## 2022-06-23 NOTE — ED Provider Notes (Signed)
[facility] EMERGENCY DEPARTMENT Provider Note   CSN: [id number] Arrival date & time: 06/23/22  0507     History  Chief Complaint  Patient presents with   Emesis    Jill Shaw is a 46 y.o. female with medical history significant for chronic pain, neuromuscular disorder, rigidity, GERD, gastritis, COPD, CHF, asthma, anemia, diabetes.  Patient presents for evaluation of nausea and vomiting.  Patient states that every time she has eaten for the last 4 days, she vomits.  The patient denies any blood in her vomit.  Patient states she is also started to have right-sided flank pain that developed 2 days ago with generalized abdominal pain.  The patient denies any dysuria, blood in urine, vaginal discharge.  Patient reports she is chronic pain patient, currently under the care of pain management clinic.  Patient denies any fevers however does endorse body aches and chills.  Patient denies history of gastroparesis.  Emesis Associated symptoms: abdominal pain, chills and myalgias   Associated symptoms: no fever        Home Medications Prior to Admission medications   Medication Sig Start Date End Date Taking? Authorizing Provider  cefdinir (OMNICEF) 300 MG capsule Take 1 capsule (300 mg total) by mouth 2 (two) times daily. 06/23/22  Yes Delice Bison F, PA-C  Accu-Chek Softclix Lancets lancets USE TO TEST AS DIRECTED UP TO FOUR TIMES DAILY. 03/06/22   Shamleffer, Konrad Dolores, MD  acetaminophen (TYLENOL) 500 MG tablet Take by mouth. 12/14/20   [provider]  albuterol (PROVENTIL) (2.5 MG/3ML) 0.083% nebulizer solution Inhale into the lungs. 09/27/20   [provider]  albuterol (VENTOLIN HFA) 108 (90 Base) MCG/ACT inhaler Inhale 2 puffs into the lungs every 6 (six) hours as needed for wheezing or shortness of breath. 07/18/21   Luciano Cutter, MD  baclofen (LIORESAL) 10 MG tablet Take 1 tablet (10 mg total) by mouth 3 (three) times daily as needed  for muscle spasms. 06/21/22   Raulkar, Drema Pry, MD  budesonide-formoterol (SYMBICORT) 160-4.5 MCG/ACT inhaler INHALE 2 PUFFS INTO THE LUNGS 2 (TWO) TIMES DAILY. 07/18/21   Luciano Cutter, MD  cetirizine (ZYRTEC) 10 MG tablet Take 1 tablet by mouth daily. 06/07/21   [provider]  Dexlansoprazole (DEXILANT) 30 MG capsule DR Take 1 capsule (30 mg total) by mouth daily. 11/11/21 11/11/22  Pyrtle, Carie Caddy, MD  diclofenac Sodium (VOLTAREN) 1 % GEL APPLY 2 GRAMS TOPICALLY TO THE AFFECTED AREA FOUR TIMES DAILY 03/05/22   Marlyne Beards, MD  dicyclomine (BENTYL) 20 MG tablet Take 1 tablet (20 mg total) by mouth 2 (two) times daily. 01/20/22   Arthor Captain, PA-C  empagliflozin (JARDIANCE) 25 MG TABS tablet Take 1 tablet (25 mg total) by mouth daily before breakfast. 06/27/21   Shamleffer, Konrad Dolores, MD  famotidine (PEPCID) 40 MG tablet Take 1 tablet (40 mg total) by mouth every evening. 06/07/22 06/07/23  Ivonne Andrew, NP  fluticasone (FLONASE) 50 MCG/ACT nasal spray Place 2 sprays into both nostrils daily. 12/02/20   Barbette Merino, NP  furosemide (LASIX) 40 MG tablet TAKE 1 TABLET(40 MG) BY MOUTH TWICE DAILY Patient taking differently: Take 40 mg by mouth 2 (two) times daily. 01/24/22   Patwardhan, Manish J, MD  glucose blood (ACCU-CHEK GUIDE) test strip USE AS DIRECTED UP TO FOUR TIMES DAILY 07/29/21   Passmore, Enid Derry I, NP  haloperidol (HALDOL) 5 MG tablet Take 1 tablet (5 mg total) by mouth every 8 (eight) hours  as needed (For nausea/vomiting). 03/31/22   Molpus, John, MD  HUMALOG KWIKPEN 200 UNIT/ML KwikPen Inject 180 Units into the skin daily. 02/15/22   [provider]  hydrochlorothiazide (MICROZIDE) 12.5 MG capsule TAKE 1 CAPSULE(12.5 MG) BY MOUTH DAILY Patient taking differently: Take 12.5 mg by mouth daily. 12/14/21   Cantwell, Celeste C, PA-C  hydrOXYzine (ATARAX) 10 MG tablet Take 10 mg by mouth at bedtime. 04/26/22   [provider]  insulin glargine, 2 Unit Dial,  (TOUJEO MAX SOLOSTAR) 300 UNIT/ML Solostar Pen Inject 160 Units into the skin daily. 05/10/22   Ivonne Andrew, NP  Insulin Pen Needle 31G X 8 MM MISC 1 Device by Does not apply route in the morning, at noon, in the evening, and at bedtime. 02/16/21   Shamleffer, Konrad Dolores, MD  isosorbide mononitrate (IMDUR) 30 MG 24 hr tablet TAKE 1 TABLET(30 MG) BY MOUTH DAILY 03/23/22   Patwardhan, Manish J, MD  losartan (COZAAR) 100 MG tablet TAKE 1 TABLET BY MOUTH EVERY DAY 05/15/22   Patwardhan, Manish J, MD  metoprolol succinate (TOPROL-XL) 100 MG 24 hr tablet Take 100 mg by mouth daily. Take with or immediately following a meal.    [provider]  norethindrone (AYGESTIN) 5 MG tablet Take 2 tablets (10 mg total) by mouth 2 (two) times daily as needed. Take for 3-4 days at a time as needed for heavy vaginal bleeding 04/21/22   [place] Bing, MD  ondansetron (ZOFRAN) 4 MG tablet Take 1 tablet (4 mg total) by mouth every 8 (eight) hours as needed for nausea or vomiting. 11/11/21   Pyrtle, Carie Caddy, MD  oxyCODONE-acetaminophen (PERCOCET) 10-325 MG tablet Take 1 tablet by mouth 5 (five) times daily as needed for pain. 06/01/22 06/01/23  Jones Bales, NP  potassium chloride SA (KLOR-CON M) 20 MEQ tablet TAKE 1 TABLET(20 MEQ) BY MOUTH DAILY 02/08/22   Runell Gess, MD  rosuvastatin (CRESTOR) 20 MG tablet TAKE 1 TABLET(20 MG) BY MOUTH DAILY 05/10/22   Patwardhan, Anabel Bene, MD  SPIRIVA RESPIMAT 2.5 MCG/ACT AERS INHALE 2 PUFFS INTO THE LUNGS DAILY 02/03/22   Icard, Rachel Bo, DO  spironolactone (ALDACTONE) 50 MG tablet TAKE 1 TABLET(50 MG) BY MOUTH DAILY 12/22/20   Patwardhan, Manish J, MD  sucralfate (CARAFATE) 1 g tablet Take 1 tablet (1 g total) by mouth 4 (four) times daily -  with meals and at bedtime. 08/21/21   Jacalyn Lefevre, MD  tiZANidine (ZANAFLEX) 4 MG tablet Take 1 tablet (4 mg total) by mouth every 6 (six) hours as needed for muscle spasms. Do Not Fill Before 06/09/2022 06/12/22   Horton Chin, MD      Allergies    Elavil [amitriptyline], Orudis [ketoprofen], Desyrel [trazodone], Aspirin, Motrin [ibuprofen], Naprosyn [naproxen], Neurontin [gabapentin], Sulfa antibiotics, Ultram [tramadol], Victoza [liraglutide], and Zegerid [omeprazole-sodium bicarbonate]    Review of Systems   Review of Systems  Constitutional:  Positive for chills. Negative for fever.  Gastrointestinal:  Positive for abdominal pain, nausea and vomiting.  Genitourinary:  Positive for flank pain. Negative for dysuria, hematuria and vaginal discharge.  Musculoskeletal:  Positive for myalgias.  All other systems reviewed and are negative.   Physical Exam Updated Vital Signs BP (!) 203/80   Pulse 85   Temp 98 F (36.7 C) (Oral)   Resp 15   LMP 05/30/2022   SpO2 98%  Physical Exam Vitals and nursing note reviewed.  Constitutional:      General: She is not in  acute distress.    Appearance: Normal appearance. She is not ill-appearing, toxic-appearing or diaphoretic.  HENT:     Head: Normocephalic and atraumatic.     Nose: Nose normal. No congestion.     Mouth/Throat:     Mouth: Mucous membranes are moist.     Pharynx: Oropharynx is clear.  Eyes:     Extraocular Movements: Extraocular movements intact.     Conjunctiva/sclera: Conjunctivae normal.     Pupils: Pupils are equal, round, and reactive to light.  Cardiovascular:     Rate and Rhythm: Normal rate and regular rhythm.  Pulmonary:     Effort: Pulmonary effort is normal.     Breath sounds: Normal breath sounds. No wheezing.  Abdominal:     General: Abdomen is flat. Bowel sounds are normal.     Palpations: Abdomen is soft.     Tenderness: There is right CVA tenderness.  Musculoskeletal:     Cervical back: Normal range of motion and neck supple. No tenderness.  Skin:    General: Skin is warm and dry.     Capillary Refill: Capillary refill takes less than 2 seconds.  Neurological:     Mental Status: She is alert and oriented to person,  place, and time.     ED Results / Procedures / Treatments   Labs (all labs ordered are listed, but only abnormal results are displayed) Labs Reviewed  COMPREHENSIVE METABOLIC PANEL - Abnormal; Notable for the following components:      Result Value   Potassium 3.3 (*)    CO2 21 (*)    Glucose, Bld 202 (*)    Creatinine, Ser 1.06 (*)    All other components within normal limits  CBC - Abnormal; Notable for the following components:   WBC 11.2 (*)    RDW 16.2 (*)    All other components within normal limits  URINALYSIS, ROUTINE W REFLEX MICROSCOPIC - Abnormal; Notable for the following components:   Color, Urine AMBER (*)    APPearance CLOUDY (*)    Glucose, UA 150 (*)    Hgb urine dipstick LARGE (*)    Ketones, ur 5 (*)    Protein, ur 100 (*)    Leukocytes,Ua MODERATE (*)    RBC / HPF >50 (*)    Bacteria, UA RARE (*)    Squamous Epithelial / LPF >50 (*)    All other components within normal limits  URINE CULTURE  LIPASE, BLOOD  I-STAT BETA HCG BLOOD, ED ([facility], [facility], [facility] ONLY)    EKG None  Radiology CT ABDOMEN PELVIS W CONTRAST  Result Date: 06/23/2022 CLINICAL DATA:  Urinary tract infection, recurrent/complicated. Nausea and vomiting for 4 days. Chills. EXAM: CT ABDOMEN AND PELVIS WITH CONTRAST TECHNIQUE: Multidetector CT imaging of the abdomen and pelvis was performed using the standard protocol following bolus administration of intravenous contrast. RADIATION DOSE REDUCTION: This exam was performed according to the departmental dose-optimization program which includes automated exposure control, adjustment of the mA and/or kV according to patient size and/or use of iterative reconstruction technique. CONTRAST:  OMNIPAQUE IOHEXOL 300 MG/ML  SOLN COMPARISON:  CT abdomen and pelvis with contrast 03/31/2022. FINDINGS: Lower chest: The lung bases are clear without focal nodule, mass, or airspace disease. The heart size is normal. No significant pleural or pericardial effusion is  present. Hepatobiliary: Diffuse fatty infiltration liver is noted. No discrete lesions are present. Common bile duct and gallbladder are within normal limits. Pancreas: Unremarkable. No pancreatic ductal dilatation or surrounding inflammatory  changes. Spleen: Normal in size without focal abnormality. Adrenals/Urinary Tract: Adrenal glands are within normal limits bilaterally. No stone or mass lesion is present. No hydronephrosis is present. Ureters are unremarkable. The urinary bladder is within normal limits. Stomach/Bowel: Stomach and duodenum are within normal limits. Small bowel is unremarkable. Terminal ileum is within normal limits. The appendix is visualized and normal. The colon is mostly collapsed. No focal inflammatory changes or mass lesion is present. Vascular/Lymphatic: No significant vascular findings are present. No enlarged abdominal or pelvic lymph nodes. Reproductive: Uterus and bilateral adnexa are unremarkable. Other: No abdominal wall hernia or abnormality. No abdominopelvic ascites. Musculoskeletal: Vertebral body heights and alignment are normal. No focal osseous lesions are present. Bony pelvis is normal. The hips are located and within normal limits bilaterally. IMPRESSION: 1. No acute or focal lesion to explain the patient's symptoms. 2. Hepatic steatosis. Electronically Signed   By: Marin Robertshristopher  Mattern M.D.   On: 06/23/2022 13:05    Procedures Procedures   Medications Ordered in ED Medications  ondansetron (ZOFRAN-ODT) disintegrating tablet 4 mg (4 mg Oral Given 06/23/22 0523)  ondansetron (ZOFRAN) injection 4 mg (4 mg Intravenous Given 06/23/22 1121)  sodium chloride 0.9 % bolus 1,000 mL (0 mLs Intravenous Stopped 06/23/22 1422)  HYDROmorphone (DILAUDID) injection 1 mg (1 mg Intravenous Given 06/23/22 1121)  cefTRIAXone (ROCEPHIN) 1 g in sodium chloride 0.9 % 100 mL IVPB (0 g Intravenous Stopped 06/23/22 1312)  HYDROmorphone (DILAUDID) injection 0.5 mg (0.5 mg Intravenous Given 06/23/22  1312)  iohexol (OMNIPAQUE) 300 MG/ML solution 100 mL (100 mLs Intravenous Contrast Given 06/23/22 1254)  potassium chloride SA (KLOR-CON M) CR tablet 40 mEq (40 mEq Oral Given 06/23/22 1446)    ED Course/ Medical Decision Making/ A&P                           Medical Decision Making Amount and/or Complexity of Data Reviewed Labs: ordered. Radiology: ordered.  Risk Prescription drug management.   [id number] presents to ED for evaluation. Please see HPI for further details.  On examination patient afebrile, nontachycardic. Patient lung sounds CTAB, not hypoxic. Patient abdomen soft and compressible throughout, no rebound/guarding. Patient has right sided CVA tenderness. Patient neurological examination shows no focal neurodeficits.   Patient will be worked up utilizing urinalysis, CMP, lipase, CBC, CT abdomen pelvis with contrast.  CBC with slight leukocytosis to 11.2 however patient denies fevers at home, not tachycardic. Patient lipase unremarkable. Patient CMP with elevated creatinine to 1.06 and decreased potassium to 3.3 repleted with 40 mEq of oral potassium. Patient urinalysis concerning for infection along with right sided CVA tenderness. CT abdomen pelvis does not show any intraabdominal pathology to account for pain.   Patient treated with 1mg  dilaudid, 1 L normal saline, 4mg  zofran. Patient requested additional pain medication multiple times throughout her visit. Patient was given additional 0.5 mg dilaudid for pain. Patient is chronic pain patient, states she has pain clinic with outside facility. Patient requesting I discharge her with prescription pain medication however due to patient stating she is under care of pain clinic with pain contract, I deferred this request.   [facility] Patient given strict return precautions which she voiced understanding with. Patient had all of her questions answered to her satisfaction prior to  discharge. Patient stable for discharge at this time.   Final Clinical Impression(s) / ED Diagnoses Final diagnoses:  Nausea and vomiting, unspecified  vomiting type  Pyelonephritis    Rx / DC Orders ED Discharge Orders          Ordered    cefdinir (OMNICEF) 300 MG capsule  2 times daily        06/23/22 1453              Al Decant, [place] 06/23/22 1454    Arby Barrette, MD 07/04/22 1209

## 2022-06-24 ENCOUNTER — Encounter (HOSPITAL_BASED_OUTPATIENT_CLINIC_OR_DEPARTMENT_OTHER): Payer: Self-pay

## 2022-06-24 ENCOUNTER — Other Ambulatory Visit: Payer: Self-pay

## 2022-06-24 ENCOUNTER — Emergency Department (HOSPITAL_BASED_OUTPATIENT_CLINIC_OR_DEPARTMENT_OTHER)
Admission: EM | Admit: 2022-06-24 | Discharge: 2022-06-24 | Disposition: A | Discharge status: Home / Self Care | Payer: Medicare Other | Attending: Emergency Medicine | Admitting: Emergency Medicine

## 2022-06-24 DIAGNOSIS — J45909 Unspecified asthma, uncomplicated: Secondary | ICD-10-CM | POA: Insufficient documentation

## 2022-06-24 DIAGNOSIS — I110 Hypertensive heart disease with heart failure: Secondary | ICD-10-CM | POA: Insufficient documentation

## 2022-06-24 DIAGNOSIS — J449 Chronic obstructive pulmonary disease, unspecified: Secondary | ICD-10-CM | POA: Diagnosis not present

## 2022-06-24 DIAGNOSIS — I5032 Chronic diastolic (congestive) heart failure: Secondary | ICD-10-CM | POA: Insufficient documentation

## 2022-06-24 DIAGNOSIS — Z7951 Long term (current) use of inhaled steroids: Secondary | ICD-10-CM | POA: Diagnosis not present

## 2022-06-24 DIAGNOSIS — Z794 Long term (current) use of insulin: Secondary | ICD-10-CM | POA: Insufficient documentation

## 2022-06-24 DIAGNOSIS — K3184 Gastroparesis: Secondary | ICD-10-CM | POA: Diagnosis not present

## 2022-06-24 DIAGNOSIS — M7918 Myalgia, other site: Secondary | ICD-10-CM | POA: Diagnosis present

## 2022-06-24 DIAGNOSIS — M791 Myalgia, unspecified site: Secondary | ICD-10-CM

## 2022-06-24 DIAGNOSIS — E1143 Type 2 diabetes mellitus with diabetic autonomic (poly)neuropathy: Secondary | ICD-10-CM | POA: Diagnosis not present

## 2022-06-24 DIAGNOSIS — F17210 Nicotine dependence, cigarettes, uncomplicated: Secondary | ICD-10-CM | POA: Diagnosis not present

## 2022-06-24 DIAGNOSIS — Z7984 Long term (current) use of oral hypoglycemic drugs: Secondary | ICD-10-CM | POA: Diagnosis not present

## 2022-06-24 LAB — URINALYSIS, ROUTINE W REFLEX MICROSCOPIC
Bilirubin Urine: NEGATIVE
Glucose, UA: >1000 mg/dL — AB
Hgb urine dipstick: NEGATIVE
Ketones, ur: >80 mg/dL — AB
Leukocytes,Ua: NEGATIVE
Nitrite: NEGATIVE
Specific Gravity, Urine: 1.02 (ref 1.005–1.030)
pH: 5.5 (ref 5.0–8.0)

## 2022-06-24 LAB — CBC WITH DIFFERENTIAL/PLATELET
Abs Immature Granulocytes: 0.04 10*3/uL (ref 0.00–0.07)
Basophils Absolute: 0.1 10*3/uL (ref 0.0–0.1)
Basophils Relative: 1 %
Eosinophils Absolute: 0.2 10*3/uL (ref 0.0–0.5)
Eosinophils Relative: 1 %
HCT: 36.5 % (ref 36.0–46.0)
Hemoglobin: 12.6 g/dL (ref 12.0–15.0)
Immature Granulocytes: 0 %
Lymphocytes Relative: 17 %
Lymphs Abs: 2.2 10*3/uL (ref 0.7–4.0)
MCH: 28.3 pg (ref 26.0–34.0)
MCHC: 34.5 g/dL (ref 30.0–36.0)
MCV: 82 fL (ref 80.0–100.0)
Monocytes Absolute: 0.6 10*3/uL (ref 0.1–1.0)
Monocytes Relative: 5 %
Neutro Abs: 9.6 10*3/uL — ABNORMAL HIGH (ref 1.7–7.7)
Neutrophils Relative %: 76 %
Platelets: 239 10*3/uL (ref 150–400)
RBC: 4.45 MIL/uL (ref 3.87–5.11)
RDW: 16.3 % — ABNORMAL HIGH (ref 11.5–15.5)
WBC: 12.7 10*3/uL — ABNORMAL HIGH (ref 4.0–10.5)
nRBC: 0 % (ref 0.0–0.2)

## 2022-06-24 LAB — COMPREHENSIVE METABOLIC PANEL
ALT: 7 U/L (ref 0–44)
AST: 12 U/L — ABNORMAL LOW (ref 15–41)
Albumin: 4.1 g/dL (ref 3.5–5.0)
Alkaline Phosphatase: 69 U/L (ref 38–126)
Anion gap: 14 (ref 5–15)
BUN: 8 mg/dL (ref 6–20)
CO2: 21 mmol/L — ABNORMAL LOW (ref 22–32)
Calcium: 9.8 mg/dL (ref 8.9–10.3)
Chloride: 105 mmol/L (ref 98–111)
Creatinine, Ser: 1.12 mg/dL — ABNORMAL HIGH (ref 0.44–1.00)
GFR, Estimated: >60 mL/min (ref >60)
Glucose, Bld: 368 mg/dL — ABNORMAL HIGH (ref 70–99)
Potassium: 3.8 mmol/L (ref 3.5–5.1)
Sodium: 140 mmol/L (ref 135–145)
Total Bilirubin: 0.6 mg/dL (ref 0.3–1.2)
Total Protein: 6.8 g/dL (ref 6.5–8.1)

## 2022-06-24 LAB — PREGNANCY, URINE: Preg Test, Ur: NEGATIVE

## 2022-06-24 LAB — LIPASE, BLOOD: Lipase: <10 U/L — ABNORMAL LOW (ref 11–51)

## 2022-06-24 MED ORDER — HYDROMORPHONE HCL 1 MG/ML IJ SOLN
0.5000 mg | Freq: Once | INTRAMUSCULAR | Status: AC
  Administered 2022-06-24: 0.5 mg via INTRAVENOUS
  Filled 2022-06-24: qty 1

## 2022-06-24 MED ORDER — ONDANSETRON HCL 4 MG/2ML IJ SOLN
4.0000 mg | Freq: Once | INTRAMUSCULAR | Status: AC | PRN
  Administered 2022-06-24: 4 mg via INTRAVENOUS
  Filled 2022-06-24: qty 2

## 2022-06-24 MED ORDER — ONDANSETRON HCL 4 MG PO TABS: 4.0000 mg | ORAL_TABLET | Freq: Three times a day (TID) | ORAL | 1 refills | Status: DC | PRN

## 2022-06-24 MED ORDER — ACETAMINOPHEN 500 MG PO TABS: 1000.0000 mg | ORAL_TABLET | Freq: Once | ORAL | Status: DC

## 2022-06-24 MED ORDER — DROPERIDOL 2.5 MG/ML IJ SOLN
2.5000 mg | Freq: Once | INTRAMUSCULAR | Status: AC
  Administered 2022-06-24: 2.5 mg via INTRAVENOUS
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
## 2022-06-24 NOTE — ED Notes (Signed)
Dc instructions reviewed with patient. Patient voiced understanding. Dc with belongings.  °

## 2022-06-24 NOTE — ED Notes (Signed)
Awaiting transport from son

## 2022-06-24 NOTE — Discharge Instructions (Addendum)
We evaluated you in the emergency department for your symptoms.  Your lab tests were reassuring.  We did not find any dangerous problems on your exam.  Please continue taking your antibiotics for the urine infection.  I have prescribed you nausea medicine for you to take with your symptoms if you experience recurrent nausea and vomiting.  Please return to the emergency department if you develop any new or worsening symptoms

## 2022-06-24 NOTE — ED Triage Notes (Signed)
Pt states she has had N/V/D, generalized body aches. Pt states that she was seen yesterday at [facility] and dx with a kidney infection. Pt is unable to keep down meds prescribed. Pt states nausea meds ineffective.

## 2022-06-25 LAB — URINE CULTURE: Culture: NO GROWTH

## 2022-06-26 ENCOUNTER — Other Ambulatory Visit: Payer: Self-pay

## 2022-06-26 ENCOUNTER — Encounter
Payer: Medicare Other | Attending: Physical Medicine and Rehabilitation | Admitting: Physical Medicine and Rehabilitation

## 2022-06-26 DIAGNOSIS — Z79891 Long term (current) use of opiate analgesic: Secondary | ICD-10-CM | POA: Insufficient documentation

## 2022-06-26 DIAGNOSIS — K2970 Gastritis, unspecified, without bleeding: Secondary | ICD-10-CM | POA: Diagnosis not present

## 2022-06-26 DIAGNOSIS — G8929 Other chronic pain: Secondary | ICD-10-CM | POA: Insufficient documentation

## 2022-06-26 DIAGNOSIS — Z5181 Encounter for therapeutic drug level monitoring: Secondary | ICD-10-CM | POA: Insufficient documentation

## 2022-06-26 DIAGNOSIS — G894 Chronic pain syndrome: Secondary | ICD-10-CM | POA: Insufficient documentation

## 2022-06-26 DIAGNOSIS — M25511 Pain in right shoulder: Secondary | ICD-10-CM | POA: Insufficient documentation

## 2022-06-26 DIAGNOSIS — G629 Polyneuropathy, unspecified: Secondary | ICD-10-CM | POA: Insufficient documentation

## 2022-06-26 DIAGNOSIS — M170 Bilateral primary osteoarthritis of knee: Secondary | ICD-10-CM | POA: Insufficient documentation

## 2022-06-26 DIAGNOSIS — M5416 Radiculopathy, lumbar region: Secondary | ICD-10-CM | POA: Insufficient documentation

## 2022-06-26 MED ORDER — OXYCODONE-ACETAMINOPHEN 10-325 MG PO TABS
1.0000 | ORAL_TABLET | Freq: Every day | ORAL | 0 refills | Status: DC | PRN
  Filled 2022-06-26: qty 150, 32d supply, fill #0
  Filled 2022-06-26: qty 150, 30d supply, fill #0
  Filled 2022-06-28: qty 150, 32d supply, fill #0
  Filled 2022-06-29: qty 150, 30d supply, fill #0

## 2022-06-26 NOTE — Progress Notes (Signed)
Subjective:    Patient ID: Jill Shaw, female    DOB: 1976-02-23, 46 y.o.   MRN: [id number]   HPI:   An audio/video tele-health visit is felt to be the most appropriate encounter for this patient at this time. This is a follow up tele-visit via phone. The patient is at home. MD is at office. Prior to scheduling this appointment, our staff discussed the limitations of evaluation and management by telemedicine and the availability of in-person appointments. The patient expressed understanding and agreed to proceed.    Jill Shaw is a 46 y.o. female who returns for f/u of inflammatory gastritis, chronic pain, diabetic peripheral neuropathy, and insomnia.   1) Insomnia: -She continues to experience insomnia at night. Asks about Ambien and I discussed that this is an addictive medication and there are other safer options we can try first that can also help with her pain.  -She is currently not taking either of these medications, or Amitriptyline or Trazodone.  -She takes Requip for resltless legs but this does not help. -She is still sleeping very poorly -She does use screens before bed time  2) Back pain secondary to lumbar radiculitis.  -She states her pain is located in her lower back radiating into her bilateral lower extremities and bilateral knee pain. She denies falling. She rates her pain 9. Her current exercise regime is walking.  -Ms. Bowmer Morphine equivalent is 20.00 MME.  -She was hospitalized for serotonin syndrome after use of Savella and Cymbalta.  -Back pain has been severe -She requests a renewal of her handicap placard as she is unable to walk 200 feet without stopping to rest.  -Her pain has been better controlled with Norco -she would like to get another XR given the chronicity of her pain.  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  -she asks whether her oxycodone can be increased to 6 tablets per day. -She tolerated Qutenza well and this provided 2 weeks of good relief, benefit started right away.  -Her peripheral neuropathy is worse in her bilateral lower extremities, especially the dorsum and soles of both feet, including the toes.  -now present in her hands as well- she has been dropping objects due to her neuropathy -she is interested in following with a dietician -she is interested in medicines for her diabetes -she is trying to lose weight  -needs handicap placard  4) Right shoulder and arm pain -notes limited range of motion in her shoulder -does have pain radiating into right arm from neck and arm feels heavy at times  5) Inflammatory gastritis -has been back and forth to the hospital as this has been really hurting her. She was given pain medication in the hospital. She was given medication for emesis.  -flexeril is making her sicker.  --patient says she has recently been diagnosed with this condition and pain has been severe. -she has her gastroenterologist recommended that she discuss with [place] increasing her Percocet to better control her pain -she is currently prescribed 3 Percocet per day but feels she would benefit from up to 5 per day  6) GERD -she can have severe reflex at times -she was started on pepcid for a short time  period to help with this.   Pain Inventory Average Pain 9 Pain Right Now 9 My pain is sharp, burning, tingling and aching  In the last 24 hours, has pain interfered with the following? General activity 0 Relation with others 0 Enjoyment of life 5 What TIME of day is your pain at its worst? morning , daytime, evening and night Sleep (in general) Poor  Pain is worse with: walking, bending, sitting, inactivity, standing and some  activites Pain improves with: heat/ice and medication Relief from Meds: 10  Family History  Problem Relation Age of Onset   Diabetes Mother    Hypertension Mother    Migraines Paternal Grandfather    Colon cancer Neg Hx    Esophageal cancer Neg Hx    Stomach cancer Neg Hx    Rectal cancer Neg Hx    Social History   Socioeconomic History   Marital status: Legally Separated    Spouse name: Not on file   Number of children: 3   Years of education: Not on file   Highest education level: Not on file  Occupational History   Occupation: unemployed  Tobacco Use   Smoking status: Some Days    Packs/day: 0.25    Years: 26.00    Total pack years: 6.50    Types: Cigarettes    Last attempt to quit: 09/13/2020    Years since quitting: 1.7   Smokeless tobacco: Never   Tobacco comments:    4-5 cigarettes/day  Vaping Use   Vaping Use: Never used  Substance and Sexual Activity   Alcohol use: No   Drug use: No   Sexual activity: Not Currently    Birth control/protection: None  Other Topics Concern   Not on file  Social History Narrative   Right Handed   Lives in a one story apartment, but lives on the second floor   Drinks caffeine once in awhile   Social Determinants of Health   Financial Resource Strain: Not on file  Food Insecurity: Food Insecurity Present (12/08/2021)   Hunger Vital Sign    Worried About Running Out of Food in the Last Year: Never true    Ran Out of Food in the Last Year: Sometimes true  Transportation Needs: No Transportation Needs (12/08/2021)   PRAPARE - Administrator, Civil ServiceTransportation    Lack of Transportation (Medical): No    Lack of Transportation (Non-Medical): No  Physical Activity: Not on file  Stress: Not on file  Social Connections: Not on file   Past Surgical History:  Procedure Laterality Date   CESAREAN SECTION     x 1. for twins   DILATION AND CURETTAGE OF UTERUS N/A 08/20/2019   Procedure: DILATATION AND CURETTAGE;  Surgeon: Allie Bossierove, Myra C, MD;   Location: [facility] OR;  Service: Gynecology;  Laterality: N/A;   ENDOMETRIAL ABLATION N/A 08/20/2019   Procedure: Minerva Ablation;  Surgeon: Allie Bossierove, Myra C, MD;  Location: [facility] OR;  Service: Gynecology;  Laterality: N/A;   EYE SURGERY Bilateral    laser right and cataract removed left eye   LEFT HEART CATH AND CORONARY ANGIOGRAPHY N/A 08/30/2021   Procedure: LEFT HEART CATH AND CORONARY ANGIOGRAPHY;  Surgeon: Elder NegusPatwardhan, Manish J, MD;  Location: [facility];  Service: Cardiovascular;  Laterality: N/A;   RADIOLOGY WITH ANESTHESIA N/A 09/16/2019   Procedure: MRI WITH ANESTHESIA   L SPINE WITHOUT CONTRAST, T SPINE WITHOUT CONTRAST , CERVICAL WITHOUT CONTRAST;  Surgeon: Radiologist, Medication, MD;  Location: [facility] OR;  Service: Radiology;  Laterality: N/A;   TUBAL LIGATION     interval BTL   UPPER GI ENDOSCOPY  07/2017   Past Surgical History:  Procedure Laterality Date   CESAREAN SECTION     x 1. for twins   DILATION AND CURETTAGE OF UTERUS N/A 08/20/2019   Procedure: DILATATION AND CURETTAGE;  Surgeon: Allie Bossier, MD;  Location: [facility];  Service: Gynecology;  Laterality: N/A;   ENDOMETRIAL ABLATION N/A 08/20/2019   Procedure: Minerva Ablation;  Surgeon: Allie Bossier, MD;  Location: [facility];  Service: Gynecology;  Laterality: N/A;   EYE SURGERY Bilateral    laser right and cataract removed left eye   LEFT HEART CATH AND CORONARY ANGIOGRAPHY N/A 08/30/2021   Procedure: LEFT HEART CATH AND CORONARY ANGIOGRAPHY;  Surgeon: Elder Negus, MD;  Location: [facility];  Service: Cardiovascular;  Laterality: N/A;   RADIOLOGY WITH ANESTHESIA N/A 09/16/2019   Procedure: MRI WITH ANESTHESIA   L SPINE WITHOUT CONTRAST, T SPINE WITHOUT CONTRAST , CERVICAL WITHOUT CONTRAST;  Surgeon: Radiologist, Medication, MD;  Location: [facility];  Service: Radiology;  Laterality: N/A;   TUBAL LIGATION     interval BTL   UPPER GI ENDOSCOPY  07/2017   Past Medical History:  Diagnosis Date   Anemia    Anxiety     Arthritis    knees, hands   Asthma    Chronic diastolic (congestive) heart failure (HCC)    COPD (chronic obstructive pulmonary disease) (HCC)    Diabetes mellitus (HCC)    DKA (diabetic ketoacidosis) (HCC)    Dysfunctional uterine bleeding    Gastritis    GERD (gastroesophageal reflux disease)    Hypertension    Neuromuscular disorder (HCC)    neuropathy feet   Seizures (HCC) 09/12/2017   pt states r/t stress and blood sugar - no meds last one 4 months ago, not seen neurologist   Sickle cell trait (HCC)    Smoker    Vitamin D deficiency 10/2019   Wears glasses    LMP 05/30/2022   Opioid Risk Score:   Fall Risk Score:  `1  Depression screen PHQ 2/9     06/01/2022    9:05 AM 05/03/2022    1:52 PM 03/17/2022    2:08 PM 03/03/2022    1:30 PM 01/03/2022   10:52 AM 12/08/2021   10:50 AM 11/11/2021    2:50 PM  Depression screen PHQ 2/9  Decreased Interest 0 0 0 1 3 2 1   Down, Depressed, Hopeless 0 0 0 1 2 2 1   PHQ - 2 Score 0 0 0 2 5 4 2   Altered sleeping   0   2   Tired, decreased energy   0   2   Change in appetite   0   0   Feeling bad or failure about yourself    0      Trouble concentrating   0   2   Moving slowly or fidgety/restless   0   0   Suicidal thoughts   0   0   PHQ-9 Score   0   10      Review of Systems  Constitutional:  Positive for diaphoresis and unexpected weight change.  Respiratory:  Positive for shortness of breath and wheezing.   Gastrointestinal:  Positive for abdominal pain, constipation and nausea.  Musculoskeletal:  Positive for arthralgias, back pain, gait problem and myalgias.  Neurological:  Positive for dizziness, weakness and numbness.  Hematological:  Bruises/bleeds easily.  Psychiatric/Behavioral:  Positive for confusion and decreased concentration. The patient is nervous/anxious.   All other systems reviewed and are negative.      Objective:  Not performed as patient was seen via phone visit.      Assessment & Plan:  1. Lumbar  Radiculitis: Continue current medication regimen. Continue HEP as Tolerated. Provided with handicap placard. XR ordered. Note for work provided via Clinical cytogeneticist.  2. Fibromyalgia: Continue HEP as Tolerated. Continue current Medication regimen. Continue to Monitor.  3. Bilateral Knee Pain: Right knee is currently worst, XR ordered and will call with results.  Continue to Monitor.  4. Chronic Pain Syndrome: refilled Percocet. Discussed that we cannot send early refills if medications are lost. Discussed that when initially increasing medication frequency she should increase to 4 tablets for at least 2 weeks before increasing any further due to risks of respiratory depression with Percocet and she is agreeable to this. Discussed with Jacalyn Lefevre, NP.  -provided note explaining her inability to work due to her pain 5. Insomnia: -Try to go outside near sunrise -Get exercise during the day.  -Discussed good sleep hygiene: turning off all devices an hour before bedtime.  -Chamomile tea with dinner.  -Melatonin did for help. -warm bath or shower before bed. -apply lavender oil for forehead at night 6. Diabetic peripheral neuropathy -checked with Qutenza rep and medicaid/medicare will not cover Qutenza more frequently than every 3 months -will request 6 patches next visit to cover her hands as well -Discussed Qutenza as an option for neuropathic pain control. Discussed that this is a capsaicin patch, stronger than capsaicin cream. Discussed that it is currently approved for diabetic peripheral neuropathy and post-herpetic neuralgia, but that it has also shown benefit in treating other forms of neuropathy. Provided patient with link to site to learn more about the patch: https://www.clark.biz/. Discussed that the patch would be placed in office and benefits usually last 3 months. Discussed that unintended exposure to capsaicin can cause severe irritation of eyes, mucous membranes, respiratory tract, and skin,  but that Qutenza is a local treatment and does not have the systemic side effects of other nerve medications. Discussed that there may be pain, itching, erythema, and decreased sensory function associated with the application of Qutenza. Side effects usually subside within 1 week. A cold pack of analgesic medications can help with these side effects. Blood pressure can also be increased due to pain associated with administration of the patch.   7. Right sided shoulder and arm pain -discussed that could be a component or frozen shoulder and/or cervical radiculitis -will obtain right shoulder XR and cervical spine XR and call when results are available  8. Obesity: -Educated that current weight is 278 lbs and current BMI is 47.72 -Educated regarding health benefits of weight loss- for pain, general health, chronic disease prevention, immune health, mental health.  -Will monitor weight every visit.  -Consider Roobois tea daily.  -Discussed the benefits of intermittent fasting. -Discussed foods that can assist in weight loss: 1) leafy greens- high in fiber and nutrients 2) dark chocolate- improves metabolism (if prefer sweetened, best to sweeten with honey instead of sugar).  3) cruciferous vegetables- high in fiber and protein 4) full fat yogurt: high in healthy fat, protein, calcium, and probiotics 5) apples- high in a variety of phytochemicals 6) nuts- high in fiber and protein that increase feelings of fullness 7) grapefruit: rich in nutrients, antioxidants, and fiber (not to be taken with anticoagulation) 8) beans-  high in protein and fiber 9) salmon- has high quality protein and healthy fats 10) green tea- rich in polyphenols 11) eggs- rich in choline and vitamin D 12) tuna- high protein, boosts metabolism 13) avocado- decreases visceral abdominal fat 14) chicken (pasture raised): high in protein and iron 15) blueberries- reduce abdominal fat and cholesterol 16) whole grains- decreases  calories retained during digestion, speeds metabolism 17) chia seeds- curb appetite 18) chilies- increases fat metabolism  -Discussed supplements that can be used:  1) Metatrim 400mg  BID 30 minutes before breakfast and dinner  2) Sphaeranthus indicus and Garcinia mangostana (combinations of these and #1 can be found in capsicum and zychrome  3) green coffee bean extract 400mg  twice per day or Irvingia (african mango) 150 to 300mg  twice per day.  9) Gastroesophageal reflux disease  -discussed the interaction between Tizanidine and Pepcid and that their additive effects could cause hypotension and bradycardia. -recommended eating small meals -recommended avoiding acidic and spicy foods -recommended apple cider vinegar in a cup of water before meals.   10) Inflammatory gastritis: -recommended following up with GI   6 minutes spent in discussing her pain, sending refill for her but educating that we cannot provide early refills for lost medication, recommended following with GI regarding inflammatory gastritis  5 minutes spent in discussion of her GERD, the interaction between Tizanidine and Pepcid, and natural techniques she can use to decrease her GERD risk

## 2022-06-26 NOTE — Telephone Encounter (Signed)
Phone visit arranged with Dr Carlis Abbott.

## 2022-06-27 ENCOUNTER — Telehealth: Payer: Self-pay | Admitting: *Deleted

## 2022-06-27 NOTE — Telephone Encounter (Signed)
Final warning letter for problems with controlled medications sent through MyChart and mailed. She received a formal warning about taking too much medication/pill count shortage on 06/07/22. She reports now she has lost her medication. No further discrepancies will be allowed.

## 2022-06-28 ENCOUNTER — Other Ambulatory Visit: Payer: Self-pay

## 2022-06-28 ENCOUNTER — Telehealth: Payer: Self-pay

## 2022-06-28 NOTE — Telephone Encounter (Signed)
Patient called stating she needs a PA for her medication.

## 2022-06-29 ENCOUNTER — Other Ambulatory Visit: Payer: Self-pay

## 2022-06-29 ENCOUNTER — Encounter: Payer: Medicare Other | Admitting: Physical Medicine and Rehabilitation

## 2022-06-29 ENCOUNTER — Telehealth: Payer: Self-pay

## 2022-06-29 NOTE — Progress Notes (Unsigned)
Attempted to contact patient to reschedule appointment for medication management. Mailbox is full.   Catie Eppie Gibson, PharmD, [facility] [phone]

## 2022-06-29 NOTE — Telephone Encounter (Signed)
PA submitted for pain medication

## 2022-06-29 NOTE — Telephone Encounter (Signed)
PA for Oxycodone-APAP sent to insurance through CoverMyMeds ?

## 2022-06-30 NOTE — Telephone Encounter (Signed)
Approved on September 14 Effective from 06/29/2022 through 06/30/2023.

## 2022-07-04 ENCOUNTER — Encounter: Payer: Self-pay | Admitting: Registered Nurse

## 2022-07-04 ENCOUNTER — Encounter (HOSPITAL_BASED_OUTPATIENT_CLINIC_OR_DEPARTMENT_OTHER): Payer: Medicare Other | Admitting: Registered Nurse

## 2022-07-04 VITALS — BP 156/84 | HR 97 | Temp 98.8°F | Ht 64.0 in | Wt 271.8 lb

## 2022-07-04 DIAGNOSIS — G629 Polyneuropathy, unspecified: Secondary | ICD-10-CM | POA: Diagnosis present

## 2022-07-04 DIAGNOSIS — Z5181 Encounter for therapeutic drug level monitoring: Secondary | ICD-10-CM

## 2022-07-04 DIAGNOSIS — G894 Chronic pain syndrome: Secondary | ICD-10-CM

## 2022-07-04 DIAGNOSIS — M25511 Pain in right shoulder: Secondary | ICD-10-CM

## 2022-07-04 DIAGNOSIS — M5416 Radiculopathy, lumbar region: Secondary | ICD-10-CM

## 2022-07-04 DIAGNOSIS — G8929 Other chronic pain: Secondary | ICD-10-CM

## 2022-07-04 DIAGNOSIS — Z79891 Long term (current) use of opiate analgesic: Secondary | ICD-10-CM | POA: Diagnosis present

## 2022-07-04 DIAGNOSIS — M170 Bilateral primary osteoarthritis of knee: Secondary | ICD-10-CM | POA: Diagnosis present

## 2022-07-04 DIAGNOSIS — K2970 Gastritis, unspecified, without bleeding: Secondary | ICD-10-CM | POA: Diagnosis present

## 2022-07-04 NOTE — Progress Notes (Signed)
Subjective:    Patient ID: Jill HighmanJulie M Gainor, female    DOB: June 12, 1976, 46 y.o.   MRN: [id number]  HPI: Jill Shaw is a 46 y.o. female who returns for follow up appointment for chronic pain and medication refill. She states her pain is located in her right shoulder, lower back pain radiating into her bilateral lower extremities. She also reports tingling and burning in her bilateral hands and bilateral feet. She rates her pain 9. Her current exercise regime is walking and performing stretching exercises.  [name] Morphine equivalent is 75.00 MME.   UDS ordered today.     Pain Inventory Average Pain 10 Pain Right Now 9 My pain is constant, sharp, dull, stabbing, and aching  In the last 24 hours, has pain interfered with the following? General activity 10 Relation with others 10 Enjoyment of life 10 What TIME of day is your pain at its worst? morning , daytime, evening, and night Sleep (in general) Poor  Pain is worse with: walking, bending, sitting, inactivity, standing, and some activites Pain improves with: heat/ice and medication Relief from Meds: 9  Family History  Problem Relation Age of Onset   Diabetes Mother    Hypertension Mother    Migraines Paternal Grandfather    Colon cancer Neg Hx    Esophageal cancer Neg Hx    Stomach cancer Neg Hx    Rectal cancer Neg Hx    Social History   Socioeconomic History   Marital status: Legally Separated    Spouse name: Not on file   Number of children: 3   Years of education: Not on file   Highest education level: Not on file  Occupational History   Occupation: unemployed  Tobacco Use   Smoking status: Some Days    Packs/day: 0.25    Years: 26.00    Total pack years: 6.50    Types: Cigarettes    Last attempt to quit: 09/13/2020    Years since quitting: 1.8   Smokeless tobacco: Never   Tobacco comments:    4-5 cigarettes/day  Vaping Use   Vaping Use: Never used  Substance and Sexual Activity   Alcohol use:  No   Drug use: No   Sexual activity: Not Currently    Birth control/protection: None  Other Topics Concern   Not on file  Social History Narrative   Right Handed   Lives in a one story apartment, but lives on the second floor   Drinks caffeine once in awhile   Social Determinants of Health   Financial Resource Strain: Not on file  Food Insecurity: Food Insecurity Present (12/08/2021)   Hunger Vital Sign    Worried About Running Out of Food in the Last Year: Never true    Ran Out of Food in the Last Year: Sometimes true  Transportation Needs: No Transportation Needs (12/08/2021)   PRAPARE - Administrator, Civil ServiceTransportation    Lack of Transportation (Medical): No    Lack of Transportation (Non-Medical): No  Physical Activity: Not on file  Stress: Not on file  Social Connections: Not on file   Past Surgical History:  Procedure Laterality Date   CESAREAN SECTION     x 1. for twins   DILATION AND CURETTAGE OF UTERUS N/A 08/20/2019   Procedure: DILATATION AND CURETTAGE;  Surgeon: Allie Bossierove, Myra C, MD;  Location: [facility];  Service: Gynecology;  Laterality: N/A;   ENDOMETRIAL ABLATION N/A 08/20/2019   Procedure: Minerva Ablation;  Surgeon: Allie Bossierove, Myra C, MD;  Location: [facility] OR;  Service: Gynecology;  Laterality: N/A;   EYE SURGERY Bilateral    laser right and cataract removed left eye   LEFT HEART CATH AND CORONARY ANGIOGRAPHY N/A 08/30/2021   Procedure: LEFT HEART CATH AND CORONARY ANGIOGRAPHY;  Surgeon: Nigel Mormon, MD;  Location: [facility];  Service: Cardiovascular;  Laterality: N/A;   RADIOLOGY WITH ANESTHESIA N/A 09/16/2019   Procedure: MRI WITH ANESTHESIA   L SPINE WITHOUT CONTRAST, T SPINE WITHOUT CONTRAST , CERVICAL WITHOUT CONTRAST;  Surgeon: Radiologist, Medication, MD;  Location: [place];  Service: Radiology;  Laterality: N/A;   TUBAL LIGATION     interval BTL   UPPER GI ENDOSCOPY  07/2017   Past Surgical History:  Procedure Laterality Date   CESAREAN SECTION     x 1. for twins    DILATION AND CURETTAGE OF UTERUS N/A 08/20/2019   Procedure: DILATATION AND CURETTAGE;  Surgeon: Emily Filbert, MD;  Location: [place];  Service: Gynecology;  Laterality: N/A;   ENDOMETRIAL ABLATION N/A 08/20/2019   Procedure: Minerva Ablation;  Surgeon: Emily Filbert, MD;  Location: [place];  Service: Gynecology;  Laterality: N/A;   EYE SURGERY Bilateral    laser right and cataract removed left eye   LEFT HEART CATH AND CORONARY ANGIOGRAPHY N/A 08/30/2021   Procedure: LEFT HEART CATH AND CORONARY ANGIOGRAPHY;  Surgeon: Nigel Mormon, MD;  Location: [place] [facility];  Service: Cardiovascular;  Laterality: N/A;   RADIOLOGY WITH ANESTHESIA N/A 09/16/2019   Procedure: MRI WITH ANESTHESIA   L SPINE WITHOUT CONTRAST, T SPINE WITHOUT CONTRAST , CERVICAL WITHOUT CONTRAST;  Surgeon: Radiologist, Medication, MD;  Location: [place];  Service: Radiology;  Laterality: N/A;   TUBAL LIGATION     interval BTL   UPPER GI ENDOSCOPY  07/2017   Past Medical History:  Diagnosis Date   Anemia    Anxiety    Arthritis    knees, hands   Asthma    Chronic diastolic (congestive) heart failure (HCC)    COPD (chronic obstructive pulmonary disease) (HCC)    Diabetes mellitus ([place])    DKA (diabetic ketoacidosis) (HCC)    Dysfunctional uterine bleeding    Gastritis    GERD (gastroesophageal reflux disease)    Hypertension    Neuromuscular disorder ([place])    neuropathy feet   Seizures ([place]) 09/12/2017   pt states r/t stress and blood sugar - no meds last one 4 months ago, not seen neurologist   Sickle cell trait ([place])    Smoker    Vitamin D deficiency 10/2019   Wears glasses    BP (!) 158/86   Pulse 95   Ht 5\' 4"  (1.626 m)   Wt 271 lb 12.8 oz (123.3 kg)   LMP 05/30/2022   SpO2 93%   BMI 46.65 kg/m   Opioid Risk Score:   Fall Risk Score:  `1  Depression screen [facility] 2/9     07/04/2022    8:58 AM 06/01/2022    9:05 AM 05/03/2022    1:52 PM 03/17/2022    2:08 PM 03/03/2022    1:30 PM 01/03/2022   10:52  AM 12/08/2021   10:50 AM  Depression screen PHQ 2/9  Decreased Interest 0 0 0 0 1 3 2   Down, Depressed, Hopeless 0 0 0 0 1 2 2   PHQ - 2 Score 0 0 0 0 2 5 4   Altered sleeping    0   2  Tired, decreased energy  0   2  Change in appetite    0   0  Feeling bad or failure about yourself     0     Trouble concentrating    0   2  Moving slowly or fidgety/restless    0   0  Suicidal thoughts    0   0  PHQ-9 Score    0   10     Review of Systems  Constitutional: Negative.   HENT: Negative.    Eyes: Negative.   Respiratory: Negative.    Cardiovascular: Negative.   Gastrointestinal: Negative.   Endocrine: Negative.   Genitourinary: Negative.   Musculoskeletal:  Positive for back pain.  Skin: Negative.   Allergic/Immunologic: Negative.   Neurological: Negative.   Hematological: Negative.   Psychiatric/Behavioral:  Positive for sleep disturbance.       Objective:   Physical Exam Vitals and nursing note reviewed.  Constitutional:      Appearance: Normal appearance.  Cardiovascular:     Rate and Rhythm: Normal rate and regular rhythm.     Pulses: Normal pulses.     Heart sounds: Normal heart sounds.  Pulmonary:     Effort: Pulmonary effort is normal.     Breath sounds: Normal breath sounds.  Musculoskeletal:     Cervical back: Normal range of motion and neck supple.     Comments: Normal Muscle Bulk and Muscle Testing Reveals:  Upper Extremities:Full  ROM and Muscle Strength 5/5 Lumbar Paraspinal Tenderness: L-3-L-5 Lower Extremities: Decreased ROM and Muscle Strength 5/5 Bilateral Lower Extremities Flexion Produces Pain into her Bilateral Patellas Arises from Table with ease Narrow Based  Gait     Skin:    General: Skin is warm and dry.  Neurological:     Mental Status: She is alert and oriented to person, place, and time.  Psychiatric:        Mood and Affect: Mood normal.        Behavior: Behavior normal.         Assessment & Plan:  1. Lumbar Radiculitis:  Continue current medication regimen. Continue HEP as Tolerated.07/04/2022 2. Fibromyalgia/Polyneuropathy: Continue HEP as Tolerated. Continue current Medication regimen. Continue to Monitor. 07/04/2022 3. Bilateral Knee Pain: No complaints today. Continue to Monitor.07/04/2022 4. Chronic Pain Syndrome: Continue Oxycodone 10/325mg  one tablet 5 times  a day as needed for pain #150. We will continue the opioid monitoring program, this consists of regular clinic visits, examinations, urine drug screen, pill counts as well as use of [place] Controlled Substance Reporting system. A 12 month History has been reviewed on the [place] on 07/04/2022.  5. Tachycardia:  Cardiology Following. Continue to monitor.  07/04/2022 6. Muscle Spasm: Continue Tizanidine: Continue to Monitor . 07/04/2022  F/U in 1 month

## 2022-07-05 ENCOUNTER — Other Ambulatory Visit: Payer: Self-pay | Admitting: Pharmacist

## 2022-07-05 DIAGNOSIS — E119 Type 2 diabetes mellitus without complications: Secondary | ICD-10-CM

## 2022-07-05 NOTE — Progress Notes (Signed)
Care Coordination Call  Received call from patient. She reports recent episodes of hypoglycemia, occasionally to the 50s. She is unable to identify whether the episodes happen more frequently overnight/fasting or after meals.   Discussed with PCP. Reduce Toujeo ~15% to 140 units daily, reduce Humalog ~15% to 50 units with meals. Patient verbalizes understanding. Updated scripts sent.   Also discussed pursuing coverage of Libre CGM - as patient had issues with DexCom staying on with prior trial. Will send script to pharmacy under systemwide standing order. If not covered under her part D coverage, will pursue coverage under medical benefit through DME provider

## 2022-07-06 MED ORDER — FREESTYLE LIBRE 3 SENSOR MISC: 3 refills | Status: DC

## 2022-07-06 MED ORDER — HUMALOG KWIKPEN 200 UNIT/ML ~~LOC~~ SOPN: 50.0000 [IU] | PEN_INJECTOR | Freq: Three times a day (TID) | SUBCUTANEOUS | 2 refills | Status: DC

## 2022-07-06 MED ORDER — TOUJEO MAX SOLOSTAR 300 UNIT/ML ~~LOC~~ SOPN: 140.0000 [IU] | PEN_INJECTOR | Freq: Every day | SUBCUTANEOUS | 2 refills | Status: DC

## 2022-07-06 NOTE — Progress Notes (Signed)
Care Coordination Call  [facility]. Libre sensor was covered on [employer] and was picked up yesterday.   Called patient to discuss. Left voicemail. Will send MyChart message with Elenor Legato 3 instructions and how to connect to clinic [place], PharmD, [facility] [phone]

## 2022-07-07 ENCOUNTER — Encounter: Payer: Self-pay | Admitting: Pharmacist

## 2022-07-07 LAB — TOXASSURE SELECT,+ANTIDEPR,UR

## 2022-07-08 ENCOUNTER — Other Ambulatory Visit: Payer: Self-pay | Admitting: Internal Medicine

## 2022-07-10 ENCOUNTER — Telehealth: Payer: Self-pay | Admitting: *Deleted

## 2022-07-10 NOTE — Telephone Encounter (Signed)
Urine drug screen for this encounter is consistent for prescribed medication 

## 2022-07-11 ENCOUNTER — Other Ambulatory Visit: Payer: Self-pay | Admitting: Internal Medicine

## 2022-07-11 ENCOUNTER — Other Ambulatory Visit: Payer: Self-pay | Admitting: Nurse Practitioner

## 2022-07-11 ENCOUNTER — Other Ambulatory Visit: Payer: Self-pay | Admitting: Pulmonary Disease

## 2022-07-11 DIAGNOSIS — J449 Chronic obstructive pulmonary disease, unspecified: Secondary | ICD-10-CM

## 2022-07-11 DIAGNOSIS — E119 Type 2 diabetes mellitus without complications: Secondary | ICD-10-CM

## 2022-07-12 ENCOUNTER — Other Ambulatory Visit: Payer: Self-pay

## 2022-07-13 ENCOUNTER — Other Ambulatory Visit: Payer: Medicare Other | Admitting: Pharmacist

## 2022-07-13 ENCOUNTER — Telehealth: Payer: Self-pay | Admitting: Pharmacist

## 2022-07-13 NOTE — Progress Notes (Signed)
Attempted to contact patient for scheduled appointment for medication management. Left HIPAA compliant message for patient to return my call at their convenience.   Catie T. Lauryl Seyer, PharmD, BCACP [facility] [phone]  

## 2022-07-14 ENCOUNTER — Ambulatory Visit: Payer: Self-pay | Admitting: Nurse Practitioner

## 2022-07-15 NOTE — Telephone Encounter (Signed)
OK to refill symbicort however she needs to have follow-up with Dr. Valeta Harms or NP when next available.

## 2022-07-17 ENCOUNTER — Encounter: Payer: Self-pay | Admitting: Physical Medicine and Rehabilitation

## 2022-07-17 ENCOUNTER — Other Ambulatory Visit: Payer: Self-pay | Admitting: Cardiology

## 2022-07-17 ENCOUNTER — Encounter
Payer: Medicare Other | Attending: Physical Medicine and Rehabilitation | Admitting: Physical Medicine and Rehabilitation

## 2022-07-17 ENCOUNTER — Other Ambulatory Visit: Payer: Self-pay

## 2022-07-17 VITALS — BP 148/87 | HR 88 | Temp 99.5°F | Ht 64.0 in | Wt 274.0 lb

## 2022-07-17 DIAGNOSIS — G629 Polyneuropathy, unspecified: Secondary | ICD-10-CM | POA: Insufficient documentation

## 2022-07-17 DIAGNOSIS — I10 Essential (primary) hypertension: Secondary | ICD-10-CM

## 2022-07-17 MED ORDER — CAPSAICIN-CLEANSING GEL 8 % EX KIT
2.0000 | PACK | Freq: Once | CUTANEOUS | Status: AC
  Administered 2022-07-17: 2 via TOPICAL

## 2022-07-17 MED ORDER — MAGNESIUM CITRATE PO SOLN: 1.0000 | Freq: Once | ORAL | 11 refills | Status: AC

## 2022-07-17 MED ORDER — OXYCODONE-ACETAMINOPHEN 10-325 MG PO TABS
1.0000 | ORAL_TABLET | ORAL | 0 refills | Status: DC | PRN
  Filled 2022-07-17 – 2022-07-21 (×2): qty 180, 30d supply, fill #0

## 2022-07-17 MED ORDER — CAPSAICIN-CLEANSING GEL 8 % EX KIT
4.0000 | PACK | Freq: Once | CUTANEOUS | Status: AC
  Administered 2022-07-17: 4 via TOPICAL

## 2022-07-17 NOTE — Progress Notes (Signed)
Subjective:    Patient ID: Jill Shaw, female    DOB: 19-Sep-1976, 46 y.o.   MRN: [id number]   HPI:    Jill Shaw is a 46 y.o. female who returns for f/u of inflammatory gastritis, chronic pain, diabetic peripheral neuropathy, and insomnia.   1) Insomnia: -She continues to experience insomnia at night. Asks about Ambien and I discussed that this is an addictive medication and there are other safer options we can try first that can also help with her pain.  -She is currently not taking either of these medications, or Amitriptyline or Trazodone.  -She takes Requip for resltless legs but this does not help. -She is still sleeping very poorly -She does use screens before bed time  2) Back pain secondary to lumbar radiculitis.  -She states her pain is located in her lower back radiating into her bilateral lower extremities and bilateral knee pain. She denies falling. She rates her pain 9. Her current exercise regime is walking.  -[name] Morphine equivalent is 20.00 MME.  -She was hospitalized for serotonin syndrome after use of Savella and Cymbalta.  -Back pain has been severe -She requests a renewal of her handicap placard as she is unable to walk 200 feet without stopping to rest.  -Her pain has been better controlled with Norco -she would like to get another XR given the chronicity of her pain.  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  -she asks whether her oxycodone can be increased to 6 tablets per day. -She tolerated Qutenza well and this provided 2 weeks of good relief, benefit started right away.  -Her peripheral neuropathy is worse in her bilateral  lower extremities, especially the dorsum and soles of both feet, including the toes.  -now present in her hands as well- she has been dropping objects due to her neuropathy- present on the dorsal and ventral aspects of her hands -she is interested in following with a dietician -she is interested in medicines for her diabetes -she is trying to lose weight  -needs handicap placard  4) Right shoulder and arm pain -notes limited range of motion in her shoulder -does have pain radiating into right arm from neck and arm feels heavy at times  5) Inflammatory gastritis -has been back and forth to the hospital as this has been really hurting her. She was given pain medication in the hospital. She was given medication for emesis.  -flexeril is making her sicker.  --patient says she has recently been diagnosed with this condition and pain has been severe. -she has her gastroenterologist recommended that she discuss with [place] increasing her Percocet to better control her pain -she is currently prescribed 3 Percocet per day but feels she would benefit from up to 5 per day  6) GERD -she can have severe reflex at times -she was started on pepcid for a short time period to help with this.   7) Morbid obesity -BMI 47.03 -weight is 274 lbs -she lose 40 lbs when she was sick since she could not tolerate food but she has gained a lot of this back -she asks about supplement she can  take for weight loss  Pain Inventory Average Pain 9 Pain Right Now 9 My pain is sharp, burning, tingling and aching  In the last 24 hours, has pain interfered with the following? General activity 0 Relation with others 0 Enjoyment of life 5 What TIME of day is your pain at its worst? morning , daytime, evening and night Sleep (in general) Poor  Pain is worse with: walking, bending, sitting, inactivity, standing and some activites Pain improves with: heat/ice and medication Relief from Meds: 53  Family History   Problem Relation Age of Onset   Diabetes Mother    Hypertension Mother    Migraines Paternal Grandfather    Colon cancer Neg Hx    Esophageal cancer Neg Hx    Stomach cancer Neg Hx    Rectal cancer Neg Hx    Social History   Socioeconomic History   Marital status: Legally Separated    Spouse name: Not on file   Number of children: 3   Years of education: Not on file   Highest education level: Not on file  Occupational History   Occupation: unemployed  Tobacco Use   Smoking status: Some Days    Packs/day: 0.25    Years: 26.00    Total pack years: 6.50    Types: Cigarettes    Last attempt to quit: 09/13/2020    Years since quitting: 1.8   Smokeless tobacco: Never   Tobacco comments:    4-5 cigarettes/day  Vaping Use   Vaping Use: Never used  Substance and Sexual Activity   Alcohol use: No   Drug use: No   Sexual activity: Not Currently    Birth control/protection: None  Other Topics Concern   Not on file  Social History Narrative   Right Handed   Lives in a one story apartment, but lives on the second floor   Drinks caffeine once in awhile   Social Determinants of Health   Financial Resource Strain: Not on file  Food Insecurity: Food Insecurity Present (12/08/2021)   Hunger Vital Sign    Worried About Running Out of Food in the Last Year: Never true    Ran Out of Food in the Last Year: Sometimes true  Transportation Needs: No Transportation Needs (12/08/2021)   PRAPARE - Hydrologist (Medical): No    Lack of Transportation (Non-Medical): No  Physical Activity: Not on file  Stress: Not on file  Social Connections: Not on file   Past Surgical History:  Procedure Laterality Date   CESAREAN SECTION     x 1. for twins   DILATION AND CURETTAGE OF UTERUS N/A 08/20/2019   Procedure: DILATATION AND CURETTAGE;  Surgeon: Emily Filbert, MD;  Location: [facility];  Service: Gynecology;  Laterality: N/A;   ENDOMETRIAL ABLATION N/A 08/20/2019    Procedure: Minerva Ablation;  Surgeon: Emily Filbert, MD;  Location: [facility];  Service: Gynecology;  Laterality: N/A;   EYE SURGERY Bilateral    laser right and cataract removed left eye   LEFT HEART CATH AND CORONARY ANGIOGRAPHY N/A 08/30/2021   Procedure: LEFT HEART CATH AND CORONARY ANGIOGRAPHY;  Surgeon: Nigel Mormon, MD;  Location: [facility];  Service: Cardiovascular;  Laterality: N/A;   RADIOLOGY WITH ANESTHESIA N/A 09/16/2019   Procedure: MRI WITH ANESTHESIA   L SPINE WITHOUT CONTRAST, T SPINE WITHOUT CONTRAST , CERVICAL WITHOUT CONTRAST;  Surgeon: Radiologist, Medication, MD;  Location: [facility];  Service: Radiology;  Laterality: N/A;  TUBAL LIGATION     interval BTL   UPPER GI ENDOSCOPY  07/2017   Past Surgical History:  Procedure Laterality Date   CESAREAN SECTION     x 1. for twins   DILATION AND CURETTAGE OF UTERUS N/A 08/20/2019   Procedure: DILATATION AND CURETTAGE;  Surgeon: Allie Bossier, MD;  Location: [facility];  Service: Gynecology;  Laterality: N/A;   ENDOMETRIAL ABLATION N/A 08/20/2019   Procedure: Minerva Ablation;  Surgeon: Allie Bossier, MD;  Location: [facility];  Service: Gynecology;  Laterality: N/A;   EYE SURGERY Bilateral    laser right and cataract removed left eye   LEFT HEART CATH AND CORONARY ANGIOGRAPHY N/A 08/30/2021   Procedure: LEFT HEART CATH AND CORONARY ANGIOGRAPHY;  Surgeon: Elder Negus, MD;  Location: [facility];  Service: Cardiovascular;  Laterality: N/A;   RADIOLOGY WITH ANESTHESIA N/A 09/16/2019   Procedure: MRI WITH ANESTHESIA   L SPINE WITHOUT CONTRAST, T SPINE WITHOUT CONTRAST , CERVICAL WITHOUT CONTRAST;  Surgeon: Radiologist, Medication, MD;  Location: [facility];  Service: Radiology;  Laterality: N/A;   TUBAL LIGATION     interval BTL   UPPER GI ENDOSCOPY  07/2017   Past Medical History:  Diagnosis Date   Anemia    Anxiety    Arthritis    knees, hands   Asthma    Chronic diastolic (congestive) heart failure (HCC)    COPD  (chronic obstructive pulmonary disease) (HCC)    Diabetes mellitus (HCC)    DKA (diabetic ketoacidosis) (HCC)    Dysfunctional uterine bleeding    Gastritis    GERD (gastroesophageal reflux disease)    Hypertension    Neuromuscular disorder (HCC)    neuropathy feet   Seizures (HCC) 09/12/2017   pt states r/t stress and blood sugar - no meds last one 4 months ago, not seen neurologist   Sickle cell trait (HCC)    Smoker    Vitamin D deficiency 10/2019   Wears glasses    BP (!) 148/87   Pulse 88   Temp 99.5 F (37.5 C)   Ht 5\' 4"  (1.626 m)   Wt 274 lb (124.3 kg)   LMP 05/30/2022   SpO2 97%   BMI 47.03 kg/m   Opioid Risk Score:   Fall Risk Score:  `1  Depression screen [facility] 2/9     07/17/2022   11:04 AM 07/04/2022    8:58 AM 06/01/2022    9:05 AM 05/03/2022    1:52 PM 03/17/2022    2:08 PM 03/03/2022    1:30 PM 01/03/2022   10:52 AM  Depression screen PHQ 2/9  Decreased Interest 0 0 0 0 0 1 3  Down, Depressed, Hopeless 0 0 0 0 0 1 2  PHQ - 2 Score 0 0 0 0 0 2 5  Altered sleeping     0    Tired, decreased energy     0    Change in appetite     0    Feeling bad or failure about yourself      0    Trouble concentrating     0    Moving slowly or fidgety/restless     0    Suicidal thoughts     0    PHQ-9 Score     0       Review of Systems  Constitutional:  Positive for diaphoresis and unexpected weight change.  Respiratory:  Positive for shortness of breath and  wheezing.   Gastrointestinal:  Positive for abdominal pain, constipation and nausea.  Musculoskeletal:  Positive for arthralgias, back pain, gait problem and myalgias.  Neurological:  Positive for dizziness, weakness and numbness.  Hematological:  Bruises/bleeds easily.  Psychiatric/Behavioral:  Positive for confusion and decreased concentration. The patient is nervous/anxious.   All other systems reviewed and are negative.      Objective:  Gen: no distress, normal appearing, weight 274 lbs, BMI 47.03, BP  148/87.  HEENT: oral mucosa pink and moist, NCAT Cardio: Reg rate Chest: normal effort, normal rate of breathing Abd: soft, non-distended Ext: no edema Psych: pleasant, normal affect Skin: intact, no open lesions Neuro: Alert and oriented x3     Assessment & Plan:  1. Lumbar Radiculitis: Continue current medication regimen. Continue HEP as Tolerated. Provided with handicap placard. XR ordered. Note for work provided via Clinical cytogeneticist.  2. Fibromyalgia: Continue HEP as Tolerated. Continue current Medication regimen. Continue to Monitor.  3. Bilateral Knee Pain: Right knee is currently worst, XR ordered and will call with results.  Continue to Monitor.  4. Chronic Pain Syndrome: Refilled Percocet. Discussed that we cannot send early refills if medications are lost. Increase percocet to 6 tabs per day prn  -provided note explaining her inability to work due to her pain 5. Insomnia: -Try to go outside near sunrise -Get exercise during the day.  -Discussed good sleep hygiene: turning off all devices an hour before bedtime.  -Chamomile tea with dinner.  -Melatonin did for help. -warm bath or shower before bed. -apply lavender oil for forehead at night 6. Diabetic peripheral neuropathy -checked with Qutenza rep and medicaid/medicare will not cover Qutenza more frequently than every 3 months -will request 6 patches next visit to cover her hands as well -Discussed Qutenza as an option for neuropathic pain control. Discussed that this is a capsaicin patch, stronger than capsaicin cream. Discussed that it is currently approved for diabetic peripheral neuropathy and post-herpetic neuralgia, but that it has also shown benefit in treating other forms of neuropathy. Provided patient with link to site to learn more about the patch: https://www.clark.biz/. Discussed that the patch would be placed in office and benefits usually last 3 months. Discussed that unintended exposure to capsaicin can cause severe  irritation of eyes, mucous membranes, respiratory tract, and skin, but that Qutenza is a local treatment and does not have the systemic side effects of other nerve medications. Discussed that there may be pain, itching, erythema, and decreased sensory function associated with the application of Qutenza. Side effects usually subside within 1 week. A cold pack of analgesic medications can help with these side effects. Blood pressure can also be increased due to pain associated with administration of the patch.   7. Right sided shoulder and arm pain -discussed that could be a component or frozen shoulder and/or cervical radiculitis -will obtain right shoulder XR and cervical spine XR and call when results are available  8. Obesity: -Educated that current weight is 274 lbs and current BMI is 47.03 -shared foods that can assist in weight loss: 1) leafy greens- high in fiber and nutrients 2) dark chocolate- improves metabolism (if prefer sweetened, best to sweeten with honey instead of sugar).  3) cruciferous vegetables- high in fiber and protein 4) full fat yogurt: high in healthy fat, protein, calcium, and probiotics 5) apples- high in a variety of phytochemicals 6) nuts- high in fiber and protein that increase feelings of fullness 7) grapefruit: rich in nutrients, antioxidants, and fiber (not  to be taken with anticoagulation) 8) beans- high in protein and fiber 9) salmon- has high quality protein and healthy fats 10) green tea- rich in polyphenols 11) eggs- rich in choline and vitamin D 12) tuna- high protein, boosts metabolism 13) avocado- decreases visceral abdominal fat 14) chicken (pasture raised): high in protein and iron 15) blueberries- reduce abdominal fat and cholesterol 16) whole grains- decreases calories retained during digestion, speeds metabolism 17) chia seeds- curb appetite 18) chilies- increases fat metabolism  -Discussed supplements that can be used:  1) Metatrim 400mg  BID  30 minutes before breakfast and dinner  2) Sphaeranthus indicus and Garcinia mangostana (combinations of these and #1 can be found in capsicum and zychrome  3) green coffee bean extract 400mg  twice per day or Irvingia (african mango) 150 to 300mg  twice per day.    9) Gastroesophageal reflux disease  -discussed the interaction between Tizanidine and Pepcid and that their additive effects could cause hypotension and bradycardia. -recommended eating small meals -recommended avoiding acidic and spicy foods -recommended apple cider vinegar in a cup of water before meals.   10) Inflammatory gastritis: -recommended following up with GI

## 2022-07-17 NOTE — Patient Instructions (Signed)
Foods that can assist in weight loss: 1) leafy greens- high in fiber and nutrients 2) dark chocolate- improves metabolism (if prefer sweetened, best to sweeten with honey instead of sugar).  3) cruciferous vegetables- high in fiber and protein 4) full fat yogurt: high in healthy fat, protein, calcium, and probiotics 5) apples- high in a variety of phytochemicals 6) nuts- high in fiber and protein that increase feelings of fullness 7) grapefruit: rich in nutrients, antioxidants, and fiber (not to be taken with anticoagulation) 8) beans- high in protein and fiber 9) salmon- has high quality protein and healthy fats 10) green tea- rich in polyphenols 11) eggs- rich in choline and vitamin D 12) tuna- high protein, boosts metabolism 13) avocado- decreases visceral abdominal fat 14) chicken (pasture raised): high in protein and iron 15) blueberries- reduce abdominal fat and cholesterol 16) whole grains- decreases calories retained during digestion, speeds metabolism 17) chia seeds- curb appetite 18) chilies- increases fat metabolism  -Discussed supplements that can be used:  1) Metatrim 400mg BID 30 minutes before breakfast and dinner  2) Sphaeranthus indicus and Garcinia mangostana (combinations of these and #1 can be found in capsicum and zychrome  3) green coffee bean extract 400mg twice per day or Irvingia (african mango) 150 to 300mg twice per day.  

## 2022-07-18 ENCOUNTER — Other Ambulatory Visit: Payer: Self-pay

## 2022-07-19 ENCOUNTER — Telehealth: Payer: Self-pay

## 2022-07-19 NOTE — Chronic Care Management (AMB) (Signed)
   Care Guide Note  07/19/2022 Name: Jill Shaw MRN: [id number] DOB: 09/10/1976  Referred by: Fenton Foy, NP Reason for referral : Care Coordination (Outreach to reschedule missed f/u with Pharm D )   Jill Shaw is a 45 y.o. year old female who is a primary care patient of Fenton Foy, NP. Jill Shaw was referred to the pharmacist for assistance related to DM.    A second unsuccessful telephone outreach was attempted today to contact the patient who was referred to the pharmacy team for assistance with medication management. Additional attempts will be made to contact the patient.  Jill Shaw, [name], [name] [address] Direct Dial: [phone] [email or web address]

## 2022-07-20 ENCOUNTER — Other Ambulatory Visit: Payer: Self-pay

## 2022-07-20 ENCOUNTER — Telehealth: Payer: Self-pay

## 2022-07-20 NOTE — Telephone Encounter (Signed)
[name] is out of the clinic as of 3 pm today and she is unable to send a refill for the patient

## 2022-07-21 ENCOUNTER — Other Ambulatory Visit: Payer: Self-pay

## 2022-07-21 ENCOUNTER — Other Ambulatory Visit: Payer: Self-pay | Admitting: Physical Medicine and Rehabilitation

## 2022-07-31 ENCOUNTER — Ambulatory Visit: Payer: Self-pay | Admitting: Nurse Practitioner

## 2022-07-31 NOTE — Chronic Care Management (AMB) (Signed)
   Care Guide Note  07/31/2022 Name: Jill Shaw MRN: [id number] DOB: 1976-03-10  Referred by: Fenton Foy, NP Reason for referral : Care Coordination (Outreach to reschedule missed f/u with Pharm D )   Jill Shaw is a 46 y.o. year old female who is a primary care patient of Fenton Foy, NP. Jill Shaw was referred to the pharmacist for assistance related to DM.    A third unsuccessful telephone outreach was attempted today to contact the patient who was referred to the pharmacy team for assistance with medication management. The Population Health team is pleased to engage with this patient at any time in the future upon receipt of referral and should he/she be interested in assistance from the [facility] team.   Noreene Larsson, [place] [facility], [place] [address] Direct Dial: [phone] [email or web address]

## 2022-07-31 NOTE — Telephone Encounter (Signed)
3rd unsuccessful outreach  

## 2022-08-01 ENCOUNTER — Encounter: Payer: Medicare Other | Admitting: Registered Nurse

## 2022-08-03 ENCOUNTER — Other Ambulatory Visit: Payer: Self-pay | Admitting: Internal Medicine

## 2022-08-08 ENCOUNTER — Encounter (INDEPENDENT_AMBULATORY_CARE_PROVIDER_SITE_OTHER): Payer: Medicare Other | Admitting: Family Medicine

## 2022-08-10 ENCOUNTER — Other Ambulatory Visit: Payer: Self-pay | Admitting: Pulmonary Disease

## 2022-08-10 ENCOUNTER — Other Ambulatory Visit: Payer: Self-pay | Admitting: Cardiology

## 2022-08-10 DIAGNOSIS — E782 Mixed hyperlipidemia: Secondary | ICD-10-CM

## 2022-08-10 DIAGNOSIS — J449 Chronic obstructive pulmonary disease, unspecified: Secondary | ICD-10-CM

## 2022-08-22 ENCOUNTER — Other Ambulatory Visit: Payer: Self-pay

## 2022-08-23 ENCOUNTER — Other Ambulatory Visit: Payer: Self-pay

## 2022-08-23 ENCOUNTER — Encounter: Payer: Self-pay | Admitting: Registered Nurse

## 2022-08-23 ENCOUNTER — Encounter: Payer: Medicare Other | Attending: Physical Medicine and Rehabilitation | Admitting: Registered Nurse

## 2022-08-23 VITALS — BP 137/80 | HR 92 | Ht 64.0 in | Wt 279.0 lb

## 2022-08-23 DIAGNOSIS — M5416 Radiculopathy, lumbar region: Secondary | ICD-10-CM | POA: Insufficient documentation

## 2022-08-23 DIAGNOSIS — G894 Chronic pain syndrome: Secondary | ICD-10-CM | POA: Insufficient documentation

## 2022-08-23 DIAGNOSIS — Z5181 Encounter for therapeutic drug level monitoring: Secondary | ICD-10-CM | POA: Insufficient documentation

## 2022-08-23 DIAGNOSIS — M170 Bilateral primary osteoarthritis of knee: Secondary | ICD-10-CM | POA: Insufficient documentation

## 2022-08-23 DIAGNOSIS — Z79891 Long term (current) use of opiate analgesic: Secondary | ICD-10-CM | POA: Insufficient documentation

## 2022-08-23 MED ORDER — OXYCODONE-ACETAMINOPHEN 10-325 MG PO TABS
1.0000 | ORAL_TABLET | ORAL | 0 refills | Status: DC | PRN
  Filled 2022-08-23: qty 180, 30d supply, fill #0

## 2022-08-23 NOTE — Progress Notes (Signed)
Subjective:    Patient ID: Jill Shaw, female    DOB: [birth year], 46 y.o.   MRN: [id number]  HPI: Jill Shaw is a 46 y.o. female who returns for follow up appointment for chronic pain and medication refill. She states her pain is located in her lower back radiating into her bilateral lower extremities and bilateral knee pain. She rates her pain 10. Her current exercise regime is walking and performing stretching exercises.  Ms. Full Morphine equivalent is 90.00 MME.   Last UDS was Performed on 07/04/2022, it was consistent.     Pain Inventory Average Pain 10 Pain Right Now 10 My pain is intermittent, sharp, burning, dull, stabbing, tingling, and aching  In the last 24 hours, has pain interfered with the following? General activity 1 Relation with others 1 Enjoyment of life 1 What TIME of day is your pain at its worst? varies Sleep (in general) Poor  Pain is worse with: walking, bending, sitting, inactivity, standing, unsure, and some activites Pain improves with: heat/ice and medication Relief from Meds: 10  Family History  Problem Relation Age of Onset   Diabetes Mother    Hypertension Mother    Migraines Paternal Grandfather    Colon cancer Neg Hx    Esophageal cancer Neg Hx    Stomach cancer Neg Hx    Rectal cancer Neg Hx    Social History   Socioeconomic History   Marital status: Legally Separated    Spouse name: Not on file   Number of children: 3   Years of education: Not on file   Highest education level: Not on file  Occupational History   Occupation: unemployed  Tobacco Use   Smoking status: Some Days    Packs/day: 0.25    Years: 26.00    Total pack years: 6.50    Types: Cigarettes    Last attempt to quit: 09/13/2020    Years since quitting: 1.9   Smokeless tobacco: Never   Tobacco comments:    4-5 cigarettes/day  Vaping Use   Vaping Use: Never used  Substance and Sexual Activity   Alcohol use: No   Drug use: No   Sexual activity:  Not Currently    Birth control/protection: None  Other Topics Concern   Not on file  Social History Narrative   Right Handed   Lives in a one story apartment, but lives on the second floor   Drinks caffeine once in awhile   Social Determinants of Health   Financial Resource Strain: Not on file  Food Insecurity: Food Insecurity Present (12/08/2021)   Hunger Vital Sign    Worried About Running Out of Food in the Last Year: Never true    Ran Out of Food in the Last Year: Sometimes true  Transportation Needs: No Transportation Needs (12/08/2021)   PRAPARE - Administrator, Civil Service (Medical): No    Lack of Transportation (Non-Medical): No  Physical Activity: Not on file  Stress: Not on file  Social Connections: Not on file   Past Surgical History:  Procedure Laterality Date   CESAREAN SECTION     x 1. for twins   DILATION AND CURETTAGE OF UTERUS N/A 08/20/2019   Procedure: DILATATION AND CURETTAGE;  Surgeon: Allie Bossier, MD;  Location: [facility] OR;  Service: Gynecology;  Laterality: N/A;   ENDOMETRIAL ABLATION N/A 08/20/2019   Procedure: Minerva Ablation;  Surgeon: Allie Bossier, MD;  Location: [facility] OR;  Service: Gynecology;  Laterality:  N/A;   EYE SURGERY Bilateral    laser right and cataract removed left eye   LEFT HEART CATH AND CORONARY ANGIOGRAPHY N/A 08/30/2021   Procedure: LEFT HEART CATH AND CORONARY ANGIOGRAPHY;  Surgeon: Elder Negus, MD;  Location: [facility];  Service: Cardiovascular;  Laterality: N/A;   RADIOLOGY WITH ANESTHESIA N/A 09/16/2019   Procedure: MRI WITH ANESTHESIA   L SPINE WITHOUT CONTRAST, T SPINE WITHOUT CONTRAST , CERVICAL WITHOUT CONTRAST;  Surgeon: Radiologist, Medication, MD;  Location: [facility] OR;  Service: Radiology;  Laterality: N/A;   TUBAL LIGATION     interval BTL   UPPER GI ENDOSCOPY  07/2017   Past Surgical History:  Procedure Laterality Date   CESAREAN SECTION     x 1. for twins   DILATION AND CURETTAGE OF UTERUS N/A  08/20/2019   Procedure: DILATATION AND CURETTAGE;  Surgeon: Allie Bossier, MD;  Location: [facility];  Service: Gynecology;  Laterality: N/A;   ENDOMETRIAL ABLATION N/A 08/20/2019   Procedure: Minerva Ablation;  Surgeon: Allie Bossier, MD;  Location: [facility];  Service: Gynecology;  Laterality: N/A;   EYE SURGERY Bilateral    laser right and cataract removed left eye   LEFT HEART CATH AND CORONARY ANGIOGRAPHY N/A 08/30/2021   Procedure: LEFT HEART CATH AND CORONARY ANGIOGRAPHY;  Surgeon: Elder Negus, MD;  Location: [facility];  Service: Cardiovascular;  Laterality: N/A;   RADIOLOGY WITH ANESTHESIA N/A 09/16/2019   Procedure: MRI WITH ANESTHESIA   L SPINE WITHOUT CONTRAST, T SPINE WITHOUT CONTRAST , CERVICAL WITHOUT CONTRAST;  Surgeon: Radiologist, Medication, MD;  Location: [facility];  Service: Radiology;  Laterality: N/A;   TUBAL LIGATION     interval BTL   UPPER GI ENDOSCOPY  07/2017   Past Medical History:  Diagnosis Date   Anemia    Anxiety    Arthritis    knees, hands   Asthma    Chronic diastolic (congestive) heart failure (HCC)    COPD (chronic obstructive pulmonary disease) (HCC)    Diabetes mellitus (HCC)    DKA (diabetic ketoacidosis) (HCC)    Dysfunctional uterine bleeding    Gastritis    GERD (gastroesophageal reflux disease)    Hypertension    Neuromuscular disorder (HCC)    neuropathy feet   Seizures (HCC) 09/12/2017   pt states r/t stress and blood sugar - no meds last one 4 months ago, not seen neurologist   Sickle cell trait (HCC)    Smoker    Vitamin D deficiency 10/2019   Wears glasses    BP 137/80   Pulse 92   Ht 5\' 4"  (1.626 m)   Wt 279 lb (126.6 kg)   SpO2 98%   BMI 47.89 kg/m   Opioid Risk Score:   Fall Risk Score:  `1  Depression screen [facility] 2/9     07/17/2022   11:04 AM 07/04/2022    8:58 AM 06/01/2022    9:05 AM 05/03/2022    1:52 PM 03/17/2022    2:08 PM 03/03/2022    1:30 PM 01/03/2022   10:52 AM  Depression screen PHQ 2/9  Decreased  Interest 0 0 0 0 0 1 3  Down, Depressed, Hopeless 0 0 0 0 0 1 2  PHQ - 2 Score 0 0 0 0 0 2 5  Altered sleeping     0    Tired, decreased energy     0    Change in appetite  0    Feeling bad or failure about yourself      0    Trouble concentrating     0    Moving slowly or fidgety/restless     0    Suicidal thoughts     0    PHQ-9 Score     0       Review of Systems  Musculoskeletal:        Bilateral knee, foot, hand pain Left shoulder pain  All other systems reviewed and are negative.     Objective:   Physical Exam Vitals and nursing note reviewed.  Constitutional:      Appearance: Normal appearance. She is obese.  Cardiovascular:     Rate and Rhythm: Normal rate and regular rhythm.     Pulses: Normal pulses.     Heart sounds: Normal heart sounds.  Musculoskeletal:     Cervical back: Normal range of motion and neck supple.     Comments: Normal Muscle Bulk and Muscle Testing Reveals:  Upper Extremities: Full ROM and Muscle Strength 5/5 Lumbar Hypersensitivity Lower Extremities: Decreased ROM and Muscle Strength 5/5 Bilateral Lower Extremities Flexion Produces Pain into her Bilateral Patella's Arises from Table slowly Narrow Based  Gait     Skin:    General: Skin is warm and dry.  Neurological:     Mental Status: She is alert and oriented to person, place, and time.  Psychiatric:        Mood and Affect: Mood normal.        Behavior: Behavior normal.         Assessment & Plan:  1. Lumbar Radiculitis: Continue current medication regimen. Continue HEP as Tolerated.08/23/2022 2. Fibromyalgia/Polyneuropathy: Continue HEP as Tolerated. Continue current Medication regimen. Continue to Monitor. 08/23/2022 3. Bilateral Knee Pain: Continue HEP as tolerated. Continue to Monitor.08/23/2022 4. Chronic Pain Syndrome: Continue Oxycodone 10/325mg  one tablet 6 times  a day as needed for pain #150. We will continue the opioid monitoring program, this consists of regular clinic  visits, examinations, urine drug screen, pill counts as well as use of [place] Controlled Substance Reporting system. A 12 month History has been reviewed on the [place] Controlled Substance Reporting System on 11/082023.  5.  Muscle Spasm: Continue Tizanidine: Continue to Monitor . 08/23/2022   F/U in 1 month

## 2022-08-29 ENCOUNTER — Encounter: Payer: Medicare Other | Admitting: Registered Nurse

## 2022-09-04 ENCOUNTER — Ambulatory Visit: Payer: Medicare Other | Admitting: Registered Nurse

## 2022-09-05 ENCOUNTER — Ambulatory Visit (INDEPENDENT_AMBULATORY_CARE_PROVIDER_SITE_OTHER): Payer: Medicare Other | Admitting: Obstetrics and Gynecology

## 2022-09-05 ENCOUNTER — Other Ambulatory Visit: Payer: Self-pay

## 2022-09-05 ENCOUNTER — Encounter: Payer: Self-pay | Admitting: Obstetrics and Gynecology

## 2022-09-05 VITALS — BP 107/71 | HR 89

## 2022-09-05 DIAGNOSIS — N939 Abnormal uterine and vaginal bleeding, unspecified: Secondary | ICD-10-CM | POA: Diagnosis not present

## 2022-09-05 DIAGNOSIS — Z3202 Encounter for pregnancy test, result negative: Secondary | ICD-10-CM

## 2022-09-05 LAB — POCT PREGNANCY, URINE: Preg Test, Ur: NEGATIVE

## 2022-09-05 MED ORDER — NORETHINDRONE ACETATE 5 MG PO TABS: 10.0000 mg | ORAL_TABLET | Freq: Two times a day (BID) | ORAL | 3 refills | Status: DC | PRN

## 2022-09-05 MED ORDER — MEDROXYPROGESTERONE ACETATE 150 MG/ML IM SUSP
150.0000 mg | Freq: Once | INTRAMUSCULAR | Status: AC
  Administered 2022-09-05: 150 mg via INTRAMUSCULAR

## 2022-09-05 NOTE — Progress Notes (Signed)
ANNUAL EXAM Patient name: Jill Shaw MRN [id number]  Date of birth: 06-09-1976 Chief Complaint:   No chief complaint on file.  History of Present Illness:   Jill Shaw is a 46 y.o. G3P0 {race:25618} female being seen today for a routine annual exam. Has significant bleeding withotu any hormones in place  Also has pain "down there" - pain within the vagina and feels that it is due to the bleeding. Throbbing/aching sensation - when on the depo still has te pain but not as bad. Will have the pain only with the bleeding. Not currently sexually active with other or self Occasional pain with voiding, shooting dull pain while urine coming out  Has had UTIs. Has been about 3 months since the last time felt that  pain  No non-cyclic  Wants to have a  Pelvic exam to check for vaginal infection including STIs and check pelvic floor - just wants to be sure that every thing "down there" is ok  Would rather return for true annual including pelvic and breast exam  Current complaints: ***  No LMP recorded. Patient has had an injection.   The pregnancy intention screening data noted above was reviewed. Potential methods of contraception were discussed. The patient elected to proceed with No data recorded.   Last pap ***. Results were: {Pap findings:25134}. H/O abnormal pap: {yes/yes***/no:23866} Last mammogram: ***. Results were: {normal, abnormal, n/a:23837}. Family h/o breast cancer: {yes***/no:23838} Last colonoscopy: ***. Results were: {normal, abnormal, n/a:23837}. Family h/o colorectal cancer: {yes***/no:23838}     07/17/2022   11:04 AM 07/04/2022    8:58 AM 06/01/2022    9:05 AM 05/03/2022    1:52 PM 03/17/2022    2:08 PM  Depression screen PHQ 2/9  Decreased Interest 0 0 0 0 0  Down, Depressed, Hopeless 0 0 0 0 0  PHQ - 2 Score 0 0 0 0 0  Altered sleeping     0  Tired, decreased energy     0  Change in appetite     0  Feeling bad or failure about yourself      0  Trouble  concentrating     0  Moving slowly or fidgety/restless     0  Suicidal thoughts     0  PHQ-9 Score     0        12/08/2021   10:51 AM 07/29/2021    9:26 AM 04/15/2021    9:48 AM 01/20/2021    5:28 PM  GAD 7 : Generalized Anxiety Score  Nervous, Anxious, on Edge 2 2 0 0  Control/stop worrying  2 1 0  Worry too much - different things 2 2 1 3   Trouble relaxing 2 2 1 3   Restless 2 2 0 3  Easily annoyed or irritable 2 2 1  0  Afraid - awful might happen 0 0 0 0  Total GAD 7 Score  12 4 9      Review of Systems:   Pertinent items are noted in HPI Denies any headaches, blurred vision, fatigue, shortness of breath, chest pain, abdominal pain, abnormal vaginal discharge/itching/odor/irritation, problems with periods, bowel movements, urination, or intercourse unless otherwise stated above. Pertinent History Reviewed:  Reviewed past medical,surgical, social and family history.  Reviewed problem list, medications and allergies. Physical Assessment:  There were no vitals filed for this visit.There is no height or weight on file to calculate BMI.        Physical Examination:   General appearance -  well appearing, and in no distress  Mental status - alert, oriented to person, place, and time  Psych:  She has a normal mood and affect  Skin - warm and dry, normal color, no suspicious lesions noted  Chest - effort normal, all lung fields clear to auscultation bilaterally  Heart - normal rate and regular rhythm  Breasts - breasts appear normal, no suspicious masses, no skin or nipple changes or  axillary nodes  Abdomen - soft, nontender, nondistended, no masses or organomegaly  Pelvic -  VULVA: normal appearing vulva with no masses, tenderness or lesions   VAGINA: normal appearing vagina with normal color and discharge, no lesions   CERVIX: normal appearing cervix without discharge or lesions, no CMT  Thin prep pap is {Desc; done/not:10129} *** HR HPV cotesting  UTERUS: uterus is felt to be  normal size, shape, consistency and nontender   ADNEXA: No adnexal masses or tenderness noted.  Extremities:  No swelling or varicosities noted  Chaperone present for exam  No results found. However, due to the size of the patient record, not all encounters were searched. Please check Results Review for a complete set of results.   @ Assessment & Plan:  There are no diagnoses linked to this encounter.  Labs/procedures today: ***  Mammogram: {Mammo f/u:25212::"@ 45yo"}, or sooner if problems Colonoscopy: {TCS f/u:25213::"@ 45yo"}, or sooner if problems  No orders of the defined types were placed in this encounter.   Meds: No orders of the defined types were placed in this encounter.   Follow-up: No follow-ups on file.  Lorriane Shire, MD 09/05/2022 2:39 PM

## 2022-09-06 ENCOUNTER — Other Ambulatory Visit: Payer: Self-pay | Admitting: Pulmonary Disease

## 2022-09-06 DIAGNOSIS — J449 Chronic obstructive pulmonary disease, unspecified: Secondary | ICD-10-CM

## 2022-09-14 ENCOUNTER — Telehealth: Payer: Self-pay | Admitting: *Deleted

## 2022-09-14 NOTE — Telephone Encounter (Signed)
Jill Shaw called for a refill on her medication oxycodone 10/325. Per the PMP she last filled 08/22/22. She has requested too early.I have asked her to call back around 12/4 or5.

## 2022-09-16 ENCOUNTER — Other Ambulatory Visit: Payer: Self-pay | Admitting: Physical Medicine and Rehabilitation

## 2022-09-16 ENCOUNTER — Encounter: Payer: Self-pay | Admitting: Physical Medicine and Rehabilitation

## 2022-09-18 ENCOUNTER — Other Ambulatory Visit: Payer: Self-pay | Admitting: Physical Medicine and Rehabilitation

## 2022-09-18 ENCOUNTER — Emergency Department (HOSPITAL_BASED_OUTPATIENT_CLINIC_OR_DEPARTMENT_OTHER)
Admission: EM | Admit: 2022-09-18 | Discharge: 2022-09-19 | Disposition: A | Discharge status: Home / Self Care | Payer: Medicare Other | Source: Home / Self Care | Attending: Emergency Medicine | Admitting: Emergency Medicine

## 2022-09-18 ENCOUNTER — Emergency Department (HOSPITAL_BASED_OUTPATIENT_CLINIC_OR_DEPARTMENT_OTHER): Payer: Medicare Other

## 2022-09-18 ENCOUNTER — Other Ambulatory Visit: Payer: Self-pay

## 2022-09-18 ENCOUNTER — Encounter: Payer: Medicare Other | Admitting: Registered Nurse

## 2022-09-18 ENCOUNTER — Other Ambulatory Visit: Payer: Self-pay | Admitting: Registered Nurse

## 2022-09-18 ENCOUNTER — Emergency Department (HOSPITAL_COMMUNITY)
Admission: EM | Admit: 2022-09-18 | Discharge: 2022-09-18 | Discharge status: Against Medical Advice | Payer: Medicare Other | Attending: Emergency Medicine | Admitting: Emergency Medicine

## 2022-09-18 ENCOUNTER — Encounter (HOSPITAL_COMMUNITY): Payer: Self-pay

## 2022-09-18 ENCOUNTER — Encounter (HOSPITAL_BASED_OUTPATIENT_CLINIC_OR_DEPARTMENT_OTHER): Payer: Self-pay

## 2022-09-18 DIAGNOSIS — Z79899 Other long term (current) drug therapy: Secondary | ICD-10-CM | POA: Insufficient documentation

## 2022-09-18 DIAGNOSIS — Z794 Long term (current) use of insulin: Secondary | ICD-10-CM | POA: Insufficient documentation

## 2022-09-18 DIAGNOSIS — Z7984 Long term (current) use of oral hypoglycemic drugs: Secondary | ICD-10-CM | POA: Insufficient documentation

## 2022-09-18 DIAGNOSIS — Z5321 Procedure and treatment not carried out due to patient leaving prior to being seen by health care provider: Secondary | ICD-10-CM | POA: Diagnosis not present

## 2022-09-18 DIAGNOSIS — R030 Elevated blood-pressure reading, without diagnosis of hypertension: Secondary | ICD-10-CM | POA: Insufficient documentation

## 2022-09-18 DIAGNOSIS — E1165 Type 2 diabetes mellitus with hyperglycemia: Secondary | ICD-10-CM | POA: Insufficient documentation

## 2022-09-18 DIAGNOSIS — E119 Type 2 diabetes mellitus without complications: Secondary | ICD-10-CM

## 2022-09-18 DIAGNOSIS — R112 Nausea with vomiting, unspecified: Secondary | ICD-10-CM

## 2022-09-18 DIAGNOSIS — R0600 Dyspnea, unspecified: Secondary | ICD-10-CM | POA: Insufficient documentation

## 2022-09-18 DIAGNOSIS — R058 Other specified cough: Secondary | ICD-10-CM | POA: Insufficient documentation

## 2022-09-18 DIAGNOSIS — R0602 Shortness of breath: Secondary | ICD-10-CM | POA: Insufficient documentation

## 2022-09-18 DIAGNOSIS — N938 Other specified abnormal uterine and vaginal bleeding: Secondary | ICD-10-CM | POA: Diagnosis not present

## 2022-09-18 DIAGNOSIS — E860 Dehydration: Secondary | ICD-10-CM | POA: Insufficient documentation

## 2022-09-18 DIAGNOSIS — R109 Unspecified abdominal pain: Secondary | ICD-10-CM | POA: Insufficient documentation

## 2022-09-18 DIAGNOSIS — R3129 Other microscopic hematuria: Secondary | ICD-10-CM

## 2022-09-18 DIAGNOSIS — R739 Hyperglycemia, unspecified: Secondary | ICD-10-CM

## 2022-09-18 DIAGNOSIS — Z1152 Encounter for screening for COVID-19: Secondary | ICD-10-CM | POA: Diagnosis not present

## 2022-09-18 DIAGNOSIS — Z20822 Contact with and (suspected) exposure to covid-19: Secondary | ICD-10-CM | POA: Insufficient documentation

## 2022-09-18 LAB — URINALYSIS, ROUTINE W REFLEX MICROSCOPIC
Bilirubin Urine: NEGATIVE
Bilirubin Urine: NEGATIVE
Glucose, UA: 150 mg/dL — AB
Glucose, UA: >1000 mg/dL — AB
Ketones, ur: NEGATIVE mg/dL
Ketones, ur: 40 mg/dL — AB
Leukocytes,Ua: NEGATIVE
Nitrite: NEGATIVE
Nitrite: NEGATIVE
Protein, ur: 30 mg/dL — AB
RBC / HPF: >50 RBC/hpf — ABNORMAL HIGH (ref 0–5)
Specific Gravity, Urine: 1.017 (ref 1.005–1.030)
Specific Gravity, Urine: 1.031 — ABNORMAL HIGH (ref 1.005–1.030)
pH: 5 (ref 5.0–8.0)
pH: 5.5 (ref 5.0–8.0)

## 2022-09-18 LAB — CBC WITH DIFFERENTIAL/PLATELET
Abs Immature Granulocytes: 0.03 10*3/uL (ref 0.00–0.07)
Basophils Absolute: 0.1 10*3/uL (ref 0.0–0.1)
Basophils Relative: 1 %
Eosinophils Absolute: 0.1 10*3/uL (ref 0.0–0.5)
Eosinophils Relative: 1 %
HCT: 39.4 % (ref 36.0–46.0)
Hemoglobin: 13.3 g/dL (ref 12.0–15.0)
Immature Granulocytes: 0 %
Lymphocytes Relative: 27 %
Lymphs Abs: 2.8 10*3/uL (ref 0.7–4.0)
MCH: 27.9 pg (ref 26.0–34.0)
MCHC: 33.8 g/dL (ref 30.0–36.0)
MCV: 82.8 fL (ref 80.0–100.0)
Monocytes Absolute: 0.7 10*3/uL (ref 0.1–1.0)
Monocytes Relative: 7 %
Neutro Abs: 6.7 10*3/uL (ref 1.7–7.7)
Neutrophils Relative %: 64 %
Platelets: 314 10*3/uL (ref 150–400)
RBC: 4.76 MIL/uL (ref 3.87–5.11)
RDW: 17 % — ABNORMAL HIGH (ref 11.5–15.5)
WBC: 10.4 10*3/uL (ref 4.0–10.5)
nRBC: 0 % (ref 0.0–0.2)

## 2022-09-18 LAB — COMPREHENSIVE METABOLIC PANEL
ALT: 12 U/L (ref 0–44)
ALT: 16 U/L (ref 0–44)
AST: 25 U/L (ref 15–41)
AST: 36 U/L (ref 15–41)
Albumin: 3.5 g/dL (ref 3.5–5.0)
Albumin: 4.7 g/dL (ref 3.5–5.0)
Alkaline Phosphatase: 64 U/L (ref 38–126)
Alkaline Phosphatase: 61 U/L (ref 38–126)
Anion gap: 11 (ref 5–15)
Anion gap: 17 — ABNORMAL HIGH (ref 5–15)
BUN: 9 mg/dL (ref 6–20)
BUN: 12 mg/dL (ref 6–20)
CO2: 22 mmol/L (ref 22–32)
CO2: 21 mmol/L — ABNORMAL LOW (ref 22–32)
Calcium: 9.2 mg/dL (ref 8.9–10.3)
Calcium: 9.8 mg/dL (ref 8.9–10.3)
Chloride: 104 mmol/L (ref 98–111)
Chloride: 101 mmol/L (ref 98–111)
Creatinine, Ser: 1.27 mg/dL — ABNORMAL HIGH (ref 0.44–1.00)
Creatinine, Ser: 1.44 mg/dL — ABNORMAL HIGH (ref 0.44–1.00)
GFR, Estimated: 53 mL/min — ABNORMAL LOW (ref >60)
GFR, Estimated: 46 mL/min — ABNORMAL LOW (ref >60)
Glucose, Bld: 237 mg/dL — ABNORMAL HIGH (ref 70–99)
Glucose, Bld: 360 mg/dL — ABNORMAL HIGH (ref 70–99)
Potassium: 3.5 mmol/L (ref 3.5–5.1)
Potassium: 3.9 mmol/L (ref 3.5–5.1)
Sodium: 137 mmol/L (ref 135–145)
Sodium: 139 mmol/L (ref 135–145)
Total Bilirubin: 0.4 mg/dL (ref 0.3–1.2)
Total Bilirubin: 0.7 mg/dL (ref 0.3–1.2)
Total Protein: 7.1 g/dL (ref 6.5–8.1)
Total Protein: 8.2 g/dL — ABNORMAL HIGH (ref 6.5–8.1)

## 2022-09-18 LAB — CBC
HCT: 40.3 % (ref 36.0–46.0)
Hemoglobin: 13.9 g/dL (ref 12.0–15.0)
MCH: 28.3 pg (ref 26.0–34.0)
MCHC: 34.5 g/dL (ref 30.0–36.0)
MCV: 81.9 fL (ref 80.0–100.0)
Platelets: 313 10*3/uL (ref 150–400)
RBC: 4.92 MIL/uL (ref 3.87–5.11)
RDW: 17.2 % — ABNORMAL HIGH (ref 11.5–15.5)
WBC: 11.5 10*3/uL — ABNORMAL HIGH (ref 4.0–10.5)
nRBC: 0 % (ref 0.0–0.2)

## 2022-09-18 LAB — RESP PANEL BY RT-PCR (FLU A&B, COVID) ARPGX2
Influenza A by PCR: NEGATIVE
Influenza B by PCR: NEGATIVE
SARS Coronavirus 2 by RT PCR: NEGATIVE

## 2022-09-18 LAB — BASIC METABOLIC PANEL
Anion gap: 12 (ref 5–15)
BUN: 13 mg/dL (ref 6–20)
CO2: 25 mmol/L (ref 22–32)
Calcium: 9.4 mg/dL (ref 8.9–10.3)
Chloride: 102 mmol/L (ref 98–111)
Creatinine, Ser: 1.48 mg/dL — ABNORMAL HIGH (ref 0.44–1.00)
GFR, Estimated: 44 mL/min — ABNORMAL LOW (ref >60)
Glucose, Bld: 338 mg/dL — ABNORMAL HIGH (ref 70–99)
Potassium: 3.8 mmol/L (ref 3.5–5.1)
Sodium: 139 mmol/L (ref 135–145)

## 2022-09-18 LAB — LIPASE, BLOOD
Lipase: 24 U/L (ref 11–51)
Lipase: <10 U/L — ABNORMAL LOW (ref 11–51)

## 2022-09-18 LAB — RAPID URINE DRUG SCREEN, HOSP PERFORMED
Amphetamines: NOT DETECTED
Barbiturates: NOT DETECTED
Benzodiazepines: NOT DETECTED
Cocaine: NOT DETECTED
Opiates: POSITIVE — AB
Tetrahydrocannabinol: NOT DETECTED

## 2022-09-18 LAB — I-STAT BETA HCG BLOOD, ED (MC, WL, AP ONLY): I-stat hCG, quantitative: <5 m[IU]/mL (ref <5)

## 2022-09-18 LAB — CBG MONITORING, ED: Glucose-Capillary: 335 mg/dL — ABNORMAL HIGH (ref 70–99)

## 2022-09-18 LAB — BRAIN NATRIURETIC PEPTIDE: B Natriuretic Peptide: 129.3 pg/mL — ABNORMAL HIGH (ref 0.0–100.0)

## 2022-09-18 LAB — PREGNANCY, URINE: Preg Test, Ur: NEGATIVE

## 2022-09-18 MED ORDER — ONDANSETRON 4 MG PO TBDP
4.0000 mg | ORAL_TABLET | Freq: Once | ORAL | Status: AC
  Administered 2022-09-18: 4 mg via ORAL
  Filled 2022-09-18: qty 1

## 2022-09-18 MED ORDER — LACTATED RINGERS IV BOLUS
500.0000 mL | Freq: Once | INTRAVENOUS | Status: AC
  Administered 2022-09-18: 500 mL via INTRAVENOUS

## 2022-09-18 MED ORDER — INSULIN ASPART 100 UNIT/ML IJ SOLN
12.0000 [IU] | Freq: Once | INTRAMUSCULAR | Status: AC
  Administered 2022-09-18: 12 [IU] via SUBCUTANEOUS

## 2022-09-18 MED ORDER — OXYCODONE-ACETAMINOPHEN 10-325 MG PO TABS
1.0000 | ORAL_TABLET | ORAL | 0 refills | Status: DC | PRN
  Filled 2022-09-18 – 2022-09-22 (×3): qty 180, 30d supply, fill #0

## 2022-09-18 MED ORDER — METOCLOPRAMIDE HCL 5 MG/ML IJ SOLN
10.0000 mg | Freq: Once | INTRAMUSCULAR | Status: AC
  Administered 2022-09-18: 10 mg via INTRAVENOUS
  Filled 2022-09-18: qty 2

## 2022-09-18 MED ORDER — ONDANSETRON HCL 4 MG/2ML IJ SOLN
4.0000 mg | Freq: Once | INTRAMUSCULAR | Status: AC
  Administered 2022-09-18: 4 mg via INTRAVENOUS
  Filled 2022-09-18: qty 2

## 2022-09-18 MED ORDER — HYDROMORPHONE HCL 1 MG/ML IJ SOLN
0.5000 mg | Freq: Once | INTRAMUSCULAR | Status: AC
  Administered 2022-09-18: 0.5 mg via INTRAVENOUS
  Filled 2022-09-18: qty 1

## 2022-09-18 MED ORDER — LORAZEPAM 2 MG/ML IJ SOLN
0.5000 mg | Freq: Once | INTRAMUSCULAR | Status: AC
  Administered 2022-09-18: 0.5 mg via INTRAVENOUS
  Filled 2022-09-18: qty 1

## 2022-09-18 MED ORDER — HYDROMORPHONE HCL 1 MG/ML IJ SOLN
1.0000 mg | Freq: Once | INTRAMUSCULAR | Status: AC
  Administered 2022-09-18: 1 mg via INTRAVENOUS
  Filled 2022-09-18: qty 1

## 2022-09-18 MED ORDER — TIZANIDINE HCL 4 MG PO TABS
4.0000 mg | ORAL_TABLET | Freq: Four times a day (QID) | ORAL | 3 refills | Status: DC | PRN
  Filled 2022-09-18 – 2022-09-22 (×3): qty 120, 30d supply, fill #0
  Filled 2022-10-11: qty 120, 30d supply, fill #1

## 2022-09-18 MED ORDER — SODIUM CHLORIDE 0.9 % IV BOLUS
1000.0000 mL | Freq: Once | INTRAVENOUS | Status: AC
  Administered 2022-09-18: 1000 mL via INTRAVENOUS

## 2022-09-18 MED ORDER — ACETAMINOPHEN 500 MG PO TABS
1000.0000 mg | ORAL_TABLET | Freq: Once | ORAL | Status: AC
  Administered 2022-09-18: 1000 mg via ORAL
  Filled 2022-09-18: qty 2

## 2022-09-18 MED ORDER — IOHEXOL 300 MG/ML  SOLN
100.0000 mL | Freq: Once | INTRAMUSCULAR | Status: AC | PRN
  Administered 2022-09-18: 100 mL via INTRAVENOUS

## 2022-09-18 NOTE — ED Notes (Signed)
Ambulatory to restroom

## 2022-09-18 NOTE — ED Triage Notes (Addendum)
Pt states she began vomiting 3 days ago and can not keep anything down. Generalized body aches Pt was at [facility] ED this am and labs have been done, including urine, LWBS due to wait time   Pt hyperventilating in triage and rocking back and forth  IV started and recollected labs if needed Zofran given

## 2022-09-18 NOTE — Telephone Encounter (Signed)
Jill Shaw has called 4 times. Per patient she needs her prescription  sent today. Thank you

## 2022-09-18 NOTE — ED Notes (Signed)
Patient reports pain 9/10. Provider notified

## 2022-09-18 NOTE — ED Notes (Signed)
Patient tolerated ginger ale. 

## 2022-09-18 NOTE — ED Provider Notes (Signed)
[facility] DEPT Provider Note   CSN: [id number] Arrival date & time: 09/18/22  1559     History  Chief Complaint  Patient presents with   Emesis   Generalized Body Aches    Jill Shaw is a 46 y.o. female.  Pt with nausea and vomiting, and general abd pain in the past 2-3 days. Symptoms acute onset, moderate, persistent, non radiating. Symptoms persistent/constant, without specific exacerbating or alleviating factors. Pt indicates same pain in past but not sure of cause. Pt limited historian. Emesis clear, not bloody or bilious. Having normal bms. +non prod cough and body aches. No sore throat or trouble swallowing. No fever or chills. No dysuria. No vaginal bleeding or discharge.   The history is provided by the patient and medical records. The history is limited by the condition of the patient.  Emesis Associated symptoms: abdominal pain, cough and myalgias   Associated symptoms: no chills, no diarrhea, no fever, no headaches and no sore throat        Home Medications Prior to Admission medications   Medication Sig Start Date End Date Taking? Authorizing Provider  Accu-Chek Softclix Lancets lancets USE TO TEST AS DIRECTED UP TO FOUR TIMES DAILY. 03/06/22   Shamleffer, Melanie Crazier, MD  albuterol (PROVENTIL) (2.5 MG/3ML) 0.083% nebulizer solution Inhale into the lungs. 09/27/20   [provider]  albuterol (VENTOLIN HFA) 108 (90 Base) MCG/ACT inhaler Inhale 2 puffs into the lungs every 6 (six) hours as needed for wheezing or shortness of breath. 07/18/21   Margaretha Seeds, MD  B-D ULTRAFINE III SHORT PEN 31G X 8 MM MISC USE AS DIRECTED EVERY MORNING, NOON, EVENING, AND AT BEDTIME 07/10/22   Shamleffer, Melanie Crazier, MD  baclofen (LIORESAL) 10 MG tablet Take 1 tablet (10 mg total) by mouth 3 (three) times daily as needed for muscle spasms. 06/21/22   Raulkar, Clide Deutscher, MD  cefdinir (OMNICEF) 300 MG capsule Take 1 capsule (300 mg total) by  mouth 2 (two) times daily. 06/23/22   Azucena Cecil, PA-C  cetirizine (ZYRTEC) 10 MG tablet Take 1 tablet by mouth daily. 06/07/21   [provider]  Continuous Blood Gluc Sensor (FREESTYLE LIBRE 3 SENSOR) MISC Place 1 sensor on the skin every 14 days. Use to check glucose continuously 07/06/22   Fenton Foy, NP  Dexlansoprazole (DEXILANT) 30 MG capsule DR Take 1 capsule (30 mg total) by mouth daily. 11/11/21 11/11/22  Pyrtle, Lajuan Lines, MD  diclofenac Sodium (VOLTAREN) 1 % GEL APPLY 2 GRAMS TOPICALLY TO THE AFFECTED AREA FOUR TIMES DAILY 03/05/22   Sherilyn Cooter, MD  dicyclomine (BENTYL) 20 MG tablet Take 1 tablet (20 mg total) by mouth 2 (two) times daily. 01/20/22   Margarita Mail, PA-C  doxycycline (VIBRAMYCIN) 50 MG capsule Take 50 mg by mouth daily. 07/11/22   [provider]  empagliflozin (JARDIANCE) 25 MG TABS tablet Take 1 tablet (25 mg total) by mouth daily before breakfast. 06/27/21   Shamleffer, Melanie Crazier, MD  famotidine (PEPCID) 40 MG tablet Take 1 tablet (40 mg total) by mouth every evening. 06/07/22 06/07/23  Fenton Foy, NP  fluticasone (FLONASE) 50 MCG/ACT nasal spray Place 2 sprays into both nostrils daily. 12/02/20   Vevelyn Francois, NP  furosemide (LASIX) 40 MG tablet TAKE 1 TABLET(40 MG) BY MOUTH TWICE DAILY 07/18/22   Patwardhan, Manish J, MD  glucose blood (ACCU-CHEK GUIDE) test strip USE TO TEST BLOOD GLUCOSE AS DIRECTED UP TO FOUR TIMES DAILY.  07/12/22   Ivonne Andrew, NP  haloperidol (HALDOL) 5 MG tablet Take 1 tablet (5 mg total) by mouth every 8 (eight) hours as needed (For nausea/vomiting). 03/31/22   Molpus, John, MD  HUMALOG KWIKPEN 200 UNIT/ML KwikPen Inject 50 Units into the skin 3 (three) times daily before meals. 07/06/22   Ivonne Andrew, NP  hydrochlorothiazide (MICROZIDE) 12.5 MG capsule TAKE 1 CAPSULE(12.5 MG) BY MOUTH DAILY Patient taking differently: Take 12.5 mg by mouth daily. 12/14/21   Cantwell, Celeste C, PA-C  hydrOXYzine  (ATARAX) 10 MG tablet Take 10 mg by mouth at bedtime. 04/26/22   [provider]  insulin glargine, 2 Unit Dial, (TOUJEO MAX SOLOSTAR) 300 UNIT/ML Solostar Pen Inject 140 Units into the skin daily. 07/06/22   Ivonne Andrew, NP  isosorbide mononitrate (IMDUR) 30 MG 24 hr tablet TAKE 1 TABLET(30 MG) BY MOUTH DAILY 03/23/22   Patwardhan, Manish J, MD  losartan (COZAAR) 100 MG tablet TAKE 1 TABLET BY MOUTH EVERY DAY 05/15/22   Patwardhan, Manish J, MD  metoprolol succinate (TOPROL-XL) 100 MG 24 hr tablet Take 100 mg by mouth daily. Take with or immediately following a meal.    [provider]  norethindrone (AYGESTIN) 5 MG tablet Take 2 tablets (10 mg total) by mouth 2 (two) times daily as needed. Take for 3-4 days at a time as needed for heavy vaginal bleeding 09/05/22   Lorriane Shire, MD  ondansetron (ZOFRAN) 4 MG tablet Take 1 tablet (4 mg total) by mouth every 8 (eight) hours as needed for nausea or vomiting. 06/24/22   Lonell Grandchild, MD  oxyCODONE-acetaminophen (PERCOCET) 10-325 MG tablet Take 1 tablet by mouth every 4 (four) hours as needed for pain. 09/18/22 09/18/23  Horton Chin, MD  Pimozide 1 MG TABS Take by mouth. 06/13/22   [provider]  potassium chloride SA (KLOR-CON M) 20 MEQ tablet TAKE 1 TABLET(20 MEQ) BY MOUTH DAILY 02/08/22   Runell Gess, MD  rosuvastatin (CRESTOR) 20 MG tablet TAKE 1 TABLET(20 MG) BY MOUTH DAILY 08/10/22   Patwardhan, Anabel Bene, MD  SPIRIVA RESPIMAT 2.5 MCG/ACT AERS INHALE 2 PUFFS INTO THE LUNGS DAILY 02/03/22   Icard, Rachel Bo, DO  spironolactone (ALDACTONE) 50 MG tablet TAKE 1 TABLET(50 MG) BY MOUTH DAILY 12/22/20   Patwardhan, Manish J, MD  sucralfate (CARAFATE) 1 g tablet Take 1 tablet (1 g total) by mouth 4 (four) times daily -  with meals and at bedtime. 08/21/21   Jacalyn Lefevre, MD  SYMBICORT 160-4.5 MCG/ACT inhaler INHALE 2 PUFFS INTO THE LUNGS TWICE DAILY 09/06/22   Luciano Cutter, MD  tiZANidine (ZANAFLEX) 4 MG  tablet Take 1 tablet (4 mg total) by mouth every 6 (six) hours as needed for muscle spasms. Do Not Fill Before 06/09/2022 06/12/22   Horton Chin, MD      Allergies    Elavil [amitriptyline], Orudis [ketoprofen], Desyrel [trazodone], Aspirin, Motrin [ibuprofen], Naprosyn [naproxen], Neurontin [gabapentin], Sulfa antibiotics, Ultram [tramadol], Victoza [liraglutide], and Zegerid [omeprazole-sodium bicarbonate]    Review of Systems   Review of Systems  Constitutional:  Negative for chills and fever.  HENT:  Negative for sore throat.   Eyes:  Negative for pain, redness and visual disturbance.  Respiratory:  Positive for cough. Negative for shortness of breath.   Cardiovascular:  Negative for chest pain.  Gastrointestinal:  Positive for abdominal pain, nausea and vomiting. Negative for constipation and diarrhea.  Genitourinary:  Negative for dysuria and flank pain.  Musculoskeletal:  Positive for myalgias. Negative for back pain and neck pain.  Skin:  Negative for rash.  Neurological:  Negative for headaches.  Hematological:  Does not bruise/bleed easily.  Psychiatric/Behavioral:  Negative for confusion.     Physical Exam Updated Vital Signs BP (!) 151/101   Pulse 90   Temp 97.8 F (36.6 C) (Oral)   Resp (!) 25   Ht 1.626 m (5\' 4" )   Wt 113.4 kg   SpO2 97%   BMI 42.91 kg/m  Physical Exam Vitals and nursing note reviewed.  Constitutional:      Appearance: Normal appearance. She is well-developed.  HENT:     Head: Atraumatic.     Nose: Nose normal.     Mouth/Throat:     Mouth: Mucous membranes are moist.     Pharynx: Oropharynx is clear. No oropharyngeal exudate or posterior oropharyngeal erythema.  Eyes:     General: No scleral icterus.    Conjunctiva/sclera: Conjunctivae normal.     Pupils: Pupils are equal, round, and reactive to light.  Neck:     Trachea: No tracheal deviation.     Comments: No stiffness or rigidity.  Cardiovascular:     Rate and Rhythm: Normal  rate and regular rhythm.     Pulses: Normal pulses.     Heart sounds: Normal heart sounds. No murmur heard.    No friction rub. No gallop.  Pulmonary:     Effort: Pulmonary effort is normal. No respiratory distress.     Breath sounds: Normal breath sounds.  Abdominal:     General: Bowel sounds are normal. There is no distension.     Palpations: Abdomen is soft.     Tenderness: There is abdominal tenderness. There is no guarding.     Comments: Epigastric and mid abd tenderness.   Genitourinary:    Comments: No cva tenderness.  Musculoskeletal:        General: No swelling or tenderness.     Cervical back: Normal range of motion and neck supple. No rigidity. No muscular tenderness.     Right lower leg: No edema.     Left lower leg: No edema.  Skin:    General: Skin is warm and dry.     Findings: No rash.  Neurological:     Mental Status: She is alert.     Comments: Alert, speech normal. Motor/sens grossly intact bil.   Psychiatric:        Mood and Affect: Mood normal.     ED Results / Procedures / Treatments   Labs (all labs ordered are listed, but only abnormal results are displayed) Results for orders placed or performed during the hospital encounter of 09/18/22  Resp Panel by RT-PCR (Flu A&B, Covid) Anterior Nasal Swab   Specimen: Anterior Nasal Swab  Result Value Ref Range   SARS Coronavirus 2 by RT PCR NEGATIVE NEGATIVE   Influenza A by PCR NEGATIVE NEGATIVE   Influenza B by PCR NEGATIVE NEGATIVE  CBC  Result Value Ref Range   WBC 11.5 (H) 4.0 - 10.5 K/uL   RBC 4.92 3.87 - 5.11 MIL/uL   Hemoglobin 13.9 12.0 - 15.0 g/dL   HCT 40.3 36.0 - 46.0 %   MCV 81.9 80.0 - 100.0 fL   MCH 28.3 26.0 - 34.0 pg   MCHC 34.5 30.0 - 36.0 g/dL   RDW 17.2 (H) 11.5 - 15.5 %   Platelets 313 150 - 400 K/uL   nRBC 0.0 0.0 - 0.2 %  Comprehensive metabolic panel  Result Value Ref Range   Sodium 139 135 - 145 mmol/L   Potassium 3.9 3.5 - 5.1 mmol/L   Chloride 101 98 - 111 mmol/L   CO2  21 (L) 22 - 32 mmol/L   Glucose, Bld 360 (H) 70 - 99 mg/dL   BUN 12 6 - 20 mg/dL   Creatinine, Ser 1.44 (H) 0.44 - 1.00 mg/dL   Calcium 9.8 8.9 - 10.3 mg/dL   Total Protein 8.2 (H) 6.5 - 8.1 g/dL   Albumin 4.7 3.5 - 5.0 g/dL   AST 36 15 - 41 U/L   ALT 16 0 - 44 U/L   Alkaline Phosphatase 61 38 - 126 U/L   Total Bilirubin 0.7 0.3 - 1.2 mg/dL   GFR, Estimated 46 (L) >60 mL/min   Anion gap 17 (H) 5 - 15  Lipase, blood  Result Value Ref Range   Lipase <10 (L) 11 - 51 U/L  Urinalysis, Routine w reflex microscopic Urine, Clean Catch  Result Value Ref Range   Color, Urine YELLOW YELLOW   APPearance CLEAR CLEAR   Specific Gravity, Urine 1.031 (H) 1.005 - 1.030   pH 5.5 5.0 - 8.0   Glucose, UA >1,000 (A) NEGATIVE mg/dL   Hgb urine dipstick LARGE (A) NEGATIVE   Bilirubin Urine NEGATIVE NEGATIVE   Ketones, ur 40 (A) NEGATIVE mg/dL   Protein, ur TRACE (A) NEGATIVE mg/dL   Nitrite NEGATIVE NEGATIVE   Leukocytes,Ua NEGATIVE NEGATIVE   RBC / HPF 21-50 0 - 5 RBC/hpf   WBC, UA 0-5 0 - 5 WBC/hpf   Squamous Epithelial / LPF 0-5 0 - 5   Mucus PRESENT    Hyaline Casts, UA PRESENT   Pregnancy, urine  Result Value Ref Range   Preg Test, Ur NEGATIVE NEGATIVE  Rapid urine drug screen (hospital performed)  Result Value Ref Range   Opiates POSITIVE (A) NONE DETECTED   Cocaine NONE DETECTED NONE DETECTED   Benzodiazepines NONE DETECTED NONE DETECTED   Amphetamines NONE DETECTED NONE DETECTED   Tetrahydrocannabinol NONE DETECTED NONE DETECTED   Barbiturates NONE DETECTED NONE DETECTED  Brain natriuretic peptide  Result Value Ref Range   B Natriuretic Peptide 129.3 (H) 0.0 - 100.0 pg/mL  Basic metabolic panel  Result Value Ref Range   Sodium 139 135 - 145 mmol/L   Potassium 3.8 3.5 - 5.1 mmol/L   Chloride 102 98 - 111 mmol/L   CO2 25 22 - 32 mmol/L   Glucose, Bld 338 (H) 70 - 99 mg/dL   BUN 13 6 - 20 mg/dL   Creatinine, Ser 1.48 (H) 0.44 - 1.00 mg/dL   Calcium 9.4 8.9 - 10.3 mg/dL   GFR,  Estimated 44 (L) >60 mL/min   Anion gap 12 5 - 15  CBG monitoring, ED  Result Value Ref Range   Glucose-Capillary 335 (H) 70 - 99 mg/dL   *Note: Due to a large number of results and/or encounters for the requested time period, some results have not been displayed. A complete set of results can be found in Results Review.    EKG EKG Interpretation  Date/Time:  Monday September 18 2022 18:53:33 EST Ventricular Rate:  99 PR Interval:  151 QRS Duration: 83 QT Interval:  301 QTC Calculation: 387 R Axis:   43 Text Interpretation: Sinus rhythm Low voltage, precordial leads Confirmed by Lajean Saver [phone]) on 09/18/2022 6:56:39 PM  Radiology DG Chest Port 1 View  Result Date: 09/18/2022 CLINICAL DATA:  Dyspnea  EXAM: PORTABLE CHEST 1 VIEW COMPARISON:  08/22/2021 FINDINGS: Linear scarring within the right lung base. Lungs are otherwise clear. No pneumothorax or pleural effusion. Cardiac size is mildly enlarged, unchanged. Pulmonary vascularity is normal. No acute bone abnormality. IMPRESSION: 1. No active disease. Stable cardiomegaly. Electronically Signed   By: Fidela Salisbury M.D.   On: 09/18/2022 20:19   CT Abdomen Pelvis W Contrast  Result Date: 09/18/2022 CLINICAL DATA:  Abdominal pain, acute, nonlocalized.  Vomiting. EXAM: CT ABDOMEN AND PELVIS WITH CONTRAST TECHNIQUE: Multidetector CT imaging of the abdomen and pelvis was performed using the standard protocol following bolus administration of intravenous contrast. RADIATION DOSE REDUCTION: This exam was performed according to the departmental dose-optimization program which includes automated exposure control, adjustment of the mA and/or kV according to patient size and/or use of iterative reconstruction technique. CONTRAST:  159mL OMNIPAQUE IOHEXOL 300 MG/ML  SOLN COMPARISON:  None Available. FINDINGS: Lower chest: No acute abnormality. Hepatobiliary: No focal liver abnormality is seen. No gallstones, gallbladder wall thickening, or biliary  dilatation. Pancreas: Atrophic but otherwise unremarkable Spleen: Unremarkable Adrenals/Urinary Tract: Adrenal glands are unremarkable. Kidneys are normal, without renal calculi, focal lesion, or hydronephrosis. Bladder is unremarkable. Stomach/Bowel: Evaluation within the upper abdomen is slightly limited by motion artifact. Despite this, the stomach, small bowel, and large bowel are unremarkable. Appendix normal. No free intraperitoneal gas or fluid. Vascular/Lymphatic: No significant vascular findings are present. No enlarged abdominal or pelvic lymph nodes. Reproductive: Uterus and bilateral adnexa are unremarkable. Other: Diastasis of the rectus abdominus musculature with tiny superimposed fat containing umbilical hernia. The rectum is unremarkable. Musculoskeletal: No acute or significant osseous findings. IMPRESSION: 1. No acute intra-abdominal pathology identified. No definite radiographic explanation for the patient's reported symptoms. Electronically Signed   By: Fidela Salisbury M.D.   On: 09/18/2022 20:00    Procedures Procedures    Medications Ordered in ED Medications  sodium chloride 0.9 % bolus 1,000 mL (has no administration in time range)  metoCLOPramide (REGLAN) injection 10 mg (has no administration in time range)  HYDROmorphone (DILAUDID) injection 0.5 mg (has no administration in time range)  ondansetron (ZOFRAN) injection 4 mg (4 mg Intravenous Given 09/18/22 1622)    ED Course/ Medical Decision Making/ A&P                           Medical Decision Making Problems Addressed: Dehydration: acute illness or injury with systemic symptoms that poses a threat to life or bodily functions Elevated blood pressure reading: acute illness or injury Hyperglycemia: acute illness or injury with systemic symptoms that poses a threat to life or bodily functions 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 Labs: ordered. Decision-making details documented in ED Course. Radiology: ordered and independent interpretation performed. Decision-making details documented in ED Course. ECG/medicine tests: ordered and independent interpretation performed. Decision-making details documented in ED Course.  Risk Prescription drug management. Parenteral controlled substances. Decision regarding hospitalization.   Iv ns. Continuous pulse ox and cardiac monitoring. Labs ordered/sent. Imaging ordered.   Diff dx includes sbo, acute infectious process, dehydration, dka, gastroparesis, gastritis, etc - dispo decision  including potential need for admission considered - will get labs and imaging and reassess.   Reviewed nursing notes and prior charts for additional history. External reports reviewed. Additional history from:  Cardiac monitor: sinus rhythm, rate 88.  Ns bolus. Dilaudid iv, reglan iv. Novolog sq.   Labs reviewed/interpreted by me - covid and flu neg.  Glucose elevated. Hco3 borderline low 21.   CT reviewed/interpreted by me - no sbo or acute process.  Additional ivf.   Trial of po.   Pt is tolerating po.   Repeat bmet - hco3 improved, gap normal.   Recheck abd soft nt. Pt requests dose of pain meds - provided.  No recurrent emesis. Pt comfortable, resting. O2 sats 98%.    Pt currently appears stable for d/c.  Rec close pcp f/u.  Return precautions provided.            Final Clinical Impression(s) / ED Diagnoses Final diagnoses:  None    Rx / DC Orders ED Discharge Orders     None         Lajean Saver, MD 09/18/22 2304

## 2022-09-18 NOTE — Discharge Instructions (Addendum)
It was our pleasure to provide your ER care today - we hope that you feel better.  Rest. Drink plenty of fluids/stay well hydrated.   Your blood sugar is high -  drink plenty of water, continue diabetes meds, follow diabetes meal plan, monitor sugars 4x/day and record values, and follow up closely with your doctor in the next 2-3 days for both recent symptoms as well as your diabetes.   Return to ER if worse, new symptoms, fevers, new or worsening or severe pain, persistent vomiting, chest pain, trouble breathing, or other emergency concern.  You were given pain meds in the ER - no driving for the next 12 hours.

## 2022-09-18 NOTE — ED Triage Notes (Signed)
Pt comes via [facility] for abd pain and vaginal bleeding that has been going on for 3 weeks, c/o of nausea as well, no vomiting

## 2022-09-18 NOTE — ED Provider Triage Note (Signed)
Emergency Medicine Provider Triage Evaluation Note  Jill Shaw , a 46 y.o. female  was evaluated in triage.  Pt complains of abdominal pain and vaginal bleeding.  Vaginal bleeding x3 weeks, abdominal pain x3 days. Reports vomiting but no emesis with EMS.  Does have hx of chronic abdominal pain per chart review.    Review of Systems  Positive: Abdominal pain, vaginal bleeding Negative: fever  Physical Exam  BP (!) 163/101 (BP Location: Right Arm)   Pulse (!) 113   Temp 98.9 F (37.2 C)   Resp (!) 22   SpO2 98%   Gen:   Awake, no distress, moaning loudly in triage, no active emesis   Resp:  Normal effort  MSK:   Moves extremities without difficulty  Other:    Medical Decision Making  Medically screening exam initiated at 3:42 AM.  Appropriate orders placed.  Jill Shaw was informed that the remainder of the evaluation will be completed by another provider, this initial triage assessment does not replace that evaluation, and the importance of remaining in the ED until their evaluation is complete.  Abdominal pain.  Hx of chronic abdominal pain.  Has had 3 abdominal CT's in the past few months, all of which negative for acute findings.  Will check abdominal labs.   Jill Hatchet, PA-C 09/18/22 [phone]

## 2022-09-18 NOTE — ED Notes (Signed)
Patient transported to CT 

## 2022-09-18 NOTE — ED Notes (Signed)
Patient stated she was leaving, she stated she was tired of waiting.

## 2022-09-19 ENCOUNTER — Emergency Department (HOSPITAL_BASED_OUTPATIENT_CLINIC_OR_DEPARTMENT_OTHER)
Admission: EM | Admit: 2022-09-19 | Discharge: 2022-09-19 | Disposition: A | Discharge status: Home / Self Care | Payer: Medicare Other | Attending: Emergency Medicine | Admitting: Emergency Medicine

## 2022-09-19 ENCOUNTER — Encounter (HOSPITAL_BASED_OUTPATIENT_CLINIC_OR_DEPARTMENT_OTHER): Payer: Self-pay | Admitting: Emergency Medicine

## 2022-09-19 ENCOUNTER — Other Ambulatory Visit (HOSPITAL_BASED_OUTPATIENT_CLINIC_OR_DEPARTMENT_OTHER): Payer: Self-pay

## 2022-09-19 ENCOUNTER — Telehealth: Payer: Self-pay

## 2022-09-19 ENCOUNTER — Other Ambulatory Visit: Payer: Self-pay

## 2022-09-19 DIAGNOSIS — Z7952 Long term (current) use of systemic steroids: Secondary | ICD-10-CM | POA: Diagnosis not present

## 2022-09-19 DIAGNOSIS — K3184 Gastroparesis: Secondary | ICD-10-CM | POA: Insufficient documentation

## 2022-09-19 DIAGNOSIS — E119 Type 2 diabetes mellitus without complications: Secondary | ICD-10-CM | POA: Insufficient documentation

## 2022-09-19 DIAGNOSIS — Z79899 Other long term (current) drug therapy: Secondary | ICD-10-CM | POA: Diagnosis not present

## 2022-09-19 DIAGNOSIS — Z7951 Long term (current) use of inhaled steroids: Secondary | ICD-10-CM | POA: Diagnosis not present

## 2022-09-19 DIAGNOSIS — I5032 Chronic diastolic (congestive) heart failure: Secondary | ICD-10-CM | POA: Diagnosis not present

## 2022-09-19 DIAGNOSIS — I110 Hypertensive heart disease with heart failure: Secondary | ICD-10-CM | POA: Insufficient documentation

## 2022-09-19 DIAGNOSIS — J449 Chronic obstructive pulmonary disease, unspecified: Secondary | ICD-10-CM | POA: Diagnosis not present

## 2022-09-19 DIAGNOSIS — Z7984 Long term (current) use of oral hypoglycemic drugs: Secondary | ICD-10-CM | POA: Diagnosis not present

## 2022-09-19 DIAGNOSIS — F1193 Opioid use, unspecified with withdrawal: Secondary | ICD-10-CM

## 2022-09-19 DIAGNOSIS — R112 Nausea with vomiting, unspecified: Secondary | ICD-10-CM | POA: Diagnosis present

## 2022-09-19 DIAGNOSIS — Z794 Long term (current) use of insulin: Secondary | ICD-10-CM | POA: Diagnosis not present

## 2022-09-19 DIAGNOSIS — I10 Essential (primary) hypertension: Secondary | ICD-10-CM | POA: Insufficient documentation

## 2022-09-19 LAB — COMPREHENSIVE METABOLIC PANEL
ALT: 29 U/L (ref 0–44)
AST: 58 U/L — ABNORMAL HIGH (ref 15–41)
Albumin: 4.4 g/dL (ref 3.5–5.0)
Alkaline Phosphatase: 53 U/L (ref 38–126)
Anion gap: 13 (ref 5–15)
BUN: 13 mg/dL (ref 6–20)
CO2: 23 mmol/L (ref 22–32)
Calcium: 9.6 mg/dL (ref 8.9–10.3)
Chloride: 105 mmol/L (ref 98–111)
Creatinine, Ser: 1.32 mg/dL — ABNORMAL HIGH (ref 0.44–1.00)
GFR, Estimated: 51 mL/min — ABNORMAL LOW (ref >60)
Glucose, Bld: 167 mg/dL — ABNORMAL HIGH (ref 70–99)
Potassium: 3.9 mmol/L (ref 3.5–5.1)
Sodium: 141 mmol/L (ref 135–145)
Total Bilirubin: 0.7 mg/dL (ref 0.3–1.2)
Total Protein: 7.6 g/dL (ref 6.5–8.1)

## 2022-09-19 LAB — CBC
HCT: 39.8 % (ref 36.0–46.0)
Hemoglobin: 13.3 g/dL (ref 12.0–15.0)
MCH: 27.7 pg (ref 26.0–34.0)
MCHC: 33.4 g/dL (ref 30.0–36.0)
MCV: 82.7 fL (ref 80.0–100.0)
Platelets: 347 10*3/uL (ref 150–400)
RBC: 4.81 MIL/uL (ref 3.87–5.11)
RDW: 17.2 % — ABNORMAL HIGH (ref 11.5–15.5)
WBC: 15.5 10*3/uL — ABNORMAL HIGH (ref 4.0–10.5)
nRBC: 0 % (ref 0.0–0.2)

## 2022-09-19 LAB — LIPASE, BLOOD: Lipase: <10 U/L — ABNORMAL LOW (ref 11–51)

## 2022-09-19 MED ORDER — HYDROMORPHONE HCL 1 MG/ML IJ SOLN
1.0000 mg | Freq: Once | INTRAMUSCULAR | Status: AC
  Administered 2022-09-19: 1 mg via INTRAVENOUS
  Filled 2022-09-19: qty 1

## 2022-09-19 MED ORDER — METOCLOPRAMIDE HCL 5 MG/ML IJ SOLN
10.0000 mg | Freq: Once | INTRAMUSCULAR | Status: AC
  Administered 2022-09-19: 10 mg via INTRAVENOUS
  Filled 2022-09-19: qty 2

## 2022-09-19 MED ORDER — SODIUM CHLORIDE 0.9 % IV BOLUS
1000.0000 mL | Freq: Once | INTRAVENOUS | Status: AC
  Administered 2022-09-19: 1000 mL via INTRAVENOUS

## 2022-09-19 MED ORDER — HYDROCODONE-ACETAMINOPHEN 5-325 MG PO TABS
1.0000 | ORAL_TABLET | Freq: Four times a day (QID) | ORAL | 0 refills | Status: DC | PRN
  Filled 2022-09-19: qty 12, 3d supply, fill #0

## 2022-09-19 MED ORDER — ONDANSETRON 4 MG PO TBDP
4.0000 mg | ORAL_TABLET | Freq: Three times a day (TID) | ORAL | 0 refills | Status: DC | PRN
  Filled 2022-09-19: qty 12, 4d supply, fill #0

## 2022-09-19 NOTE — Discharge Instructions (Signed)
Your lites today much improved from yesterday.  Pain medicine helps with the persistent nausea and vomiting.  We understand that you are out of your narcotic medication until the eighth.  We will give you a short course of some hydrocodone cannot prescribe the same type.  But you can take 1 or 2 of these.  And also take the Zofran ODT with it.  Make an appointment to follow-up with your doctor.  Return for any new or worse symptoms.

## 2022-09-19 NOTE — ED Notes (Signed)
Pt discharged home after verbalizing understanding of discharge instructions; nad noted.  Pt to be picked up by her mother.

## 2022-09-19 NOTE — Telephone Encounter (Signed)
Generic voice message left for call back. No voice ID.

## 2022-09-19 NOTE — ED Triage Notes (Signed)
Pt arrives to ED with c/o generalized abd pain, nausea, vomiting, and body aches x4 days. She was unable to pickup rx meds from yesterday. Was seen in ED yesterday for same.

## 2022-09-19 NOTE — ED Provider Notes (Signed)
[facility] Provider Note   CSN: [id number] Arrival date & time: 09/19/22  1058     History  Chief Complaint  Patient presents with   Emesis   Abdominal Pain    Jill HighmanJulie M Stinger is a 46 y.o. female.  Patient seen here yesterday for abdominal pain nausea and vomiting.  Had CT scan done that was negative treated with pain medicine and antinausea medicine to include Reglan and was better.  Discharged home.  Patient Baxan she can get her meds filled.  What she means by that is she is on chronic narcotic pain medicine and she gets 30-day supply and it is not available to pick up again until December 8 because that is when her initial prescription should be used up.  However patient was taking more than she should have been and therefore is run out early.  Past medical history significant for hypertension COPD neuromuscular disorder chronic diastolic congestive heart failure diabetes.  And a history of chronic pain.       Home Medications Prior to Admission medications   Medication Sig Start Date End Date Taking? Authorizing Provider  HYDROcodone-acetaminophen (NORCO/VICODIN) 5-325 MG tablet Take 1 tablet by mouth every 6 (six) hours as needed for moderate pain. 09/19/22  Yes Vanetta MuldersZackowski, Arya Luttrull, MD  ondansetron (ZOFRAN-ODT) 4 MG disintegrating tablet Take 1 tablet (4 mg total) by mouth every 8 (eight) hours as needed for nausea or vomiting. 09/19/22  Yes Vanetta MuldersZackowski, Javius Sylla, MD  Accu-Chek Softclix Lancets lancets USE TO TEST AS DIRECTED UP TO FOUR TIMES DAILY. 03/06/22   Shamleffer, Konrad DoloresIbtehal Jaralla, MD  albuterol (PROVENTIL) (2.5 MG/3ML) 0.083% nebulizer solution Inhale into the lungs. 09/27/20   [provider]  albuterol (VENTOLIN HFA) 108 (90 Base) MCG/ACT inhaler Inhale 2 puffs into the lungs every 6 (six) hours as needed for wheezing or shortness of breath. 07/18/21   Luciano CutterEllison, Chi Jane, MD  B-D ULTRAFINE III SHORT PEN 31G X 8 MM MISC USE AS DIRECTED EVERY  MORNING, NOON, EVENING, AND AT BEDTIME 07/10/22   Shamleffer, Konrad DoloresIbtehal Jaralla, MD  baclofen (LIORESAL) 10 MG tablet Take 1 tablet (10 mg total) by mouth 3 (three) times daily as needed for muscle spasms. 06/21/22   Raulkar, Drema PryKrutika P, MD  cefdinir (OMNICEF) 300 MG capsule Take 1 capsule (300 mg total) by mouth 2 (two) times daily. 06/23/22   Al DecantGroce, Christopher F, PA-C  cetirizine (ZYRTEC) 10 MG tablet Take 1 tablet by mouth daily. 06/07/21   [provider]  Continuous Blood Gluc Sensor (FREESTYLE LIBRE 3 SENSOR) MISC Place 1 sensor on the skin every 14 days. Use to check glucose continuously 07/06/22   Ivonne AndrewNichols, Tonya S, NP  Dexlansoprazole (DEXILANT) 30 MG capsule DR Take 1 capsule (30 mg total) by mouth daily. 11/11/21 11/11/22  Pyrtle, Carie CaddyJay M, MD  diclofenac Sodium (VOLTAREN) 1 % GEL APPLY 2 GRAMS TOPICALLY TO THE AFFECTED AREA FOUR TIMES DAILY 03/05/22   Marlyne BeardsBenfield, Charlie, MD  dicyclomine (BENTYL) 20 MG tablet Take 1 tablet (20 mg total) by mouth 2 (two) times daily. 01/20/22   Arthor CaptainHarris, Abigail, PA-C  doxycycline (VIBRAMYCIN) 50 MG capsule Take 50 mg by mouth daily. 07/11/22   [provider]  empagliflozin (JARDIANCE) 25 MG TABS tablet Take 1 tablet (25 mg total) by mouth daily before breakfast. 06/27/21   Shamleffer, Konrad DoloresIbtehal Jaralla, MD  famotidine (PEPCID) 40 MG tablet Take 1 tablet (40 mg total) by mouth every evening. 06/07/22 06/07/23  Ivonne AndrewNichols, Tonya S, NP  fluticasone Aleda Grana(FLONASE)  50 MCG/ACT nasal spray Place 2 sprays into both nostrils daily. 12/02/20   Barbette Merino, NP  furosemide (LASIX) 40 MG tablet TAKE 1 TABLET(40 MG) BY MOUTH TWICE DAILY 07/18/22   Patwardhan, Manish J, MD  glucose blood (ACCU-CHEK GUIDE) test strip USE TO TEST BLOOD GLUCOSE AS DIRECTED UP TO FOUR TIMES DAILY. 07/12/22   Ivonne Andrew, NP  haloperidol (HALDOL) 5 MG tablet Take 1 tablet (5 mg total) by mouth every 8 (eight) hours as needed (For nausea/vomiting). 03/31/22   Molpus, John, MD  HUMALOG KWIKPEN 200 UNIT/ML  KwikPen Inject 50 Units into the skin 3 (three) times daily before meals. 07/06/22   Ivonne Andrew, NP  hydrochlorothiazide (MICROZIDE) 12.5 MG capsule TAKE 1 CAPSULE(12.5 MG) BY MOUTH DAILY Patient taking differently: Take 12.5 mg by mouth daily. 12/14/21   Cantwell, Celeste C, PA-C  hydrOXYzine (ATARAX) 10 MG tablet Take 10 mg by mouth at bedtime. 04/26/22   [provider]  insulin glargine, 2 Unit Dial, (TOUJEO MAX SOLOSTAR) 300 UNIT/ML Solostar Pen Inject 140 Units into the skin daily. 07/06/22   Ivonne Andrew, NP  isosorbide mononitrate (IMDUR) 30 MG 24 hr tablet TAKE 1 TABLET(30 MG) BY MOUTH DAILY 03/23/22   Patwardhan, Manish J, MD  losartan (COZAAR) 100 MG tablet TAKE 1 TABLET BY MOUTH EVERY DAY 05/15/22   Patwardhan, Manish J, MD  metoprolol succinate (TOPROL-XL) 100 MG 24 hr tablet Take 100 mg by mouth daily. Take with or immediately following a meal.    [provider]  norethindrone (AYGESTIN) 5 MG tablet Take 2 tablets (10 mg total) by mouth 2 (two) times daily as needed. Take for 3-4 days at a time as needed for heavy vaginal bleeding 09/05/22   Lorriane Shire, MD  ondansetron (ZOFRAN) 4 MG tablet Take 1 tablet (4 mg total) by mouth every 8 (eight) hours as needed for nausea or vomiting. 06/24/22   Lonell Grandchild, MD  oxyCODONE-acetaminophen (PERCOCET) 10-325 MG tablet Take 1 tablet by mouth every 4 (four) hours as needed for pain. 09/18/22 09/18/23  Horton Chin, MD  Pimozide 1 MG TABS Take by mouth. 06/13/22   [provider]  potassium chloride SA (KLOR-CON M) 20 MEQ tablet TAKE 1 TABLET(20 MEQ) BY MOUTH DAILY 02/08/22   Runell Gess, MD  rosuvastatin (CRESTOR) 20 MG tablet TAKE 1 TABLET(20 MG) BY MOUTH DAILY 08/10/22   Patwardhan, Anabel Bene, MD  SPIRIVA RESPIMAT 2.5 MCG/ACT AERS INHALE 2 PUFFS INTO THE LUNGS DAILY 02/03/22   Icard, Rachel Bo, DO  spironolactone (ALDACTONE) 50 MG tablet TAKE 1 TABLET(50 MG) BY MOUTH DAILY 12/22/20   Patwardhan,  Manish J, MD  sucralfate (CARAFATE) 1 g tablet Take 1 tablet (1 g total) by mouth 4 (four) times daily -  with meals and at bedtime. 08/21/21   Jacalyn Lefevre, MD  SYMBICORT 160-4.5 MCG/ACT inhaler INHALE 2 PUFFS INTO THE LUNGS TWICE DAILY 09/06/22   Luciano Cutter, MD  tiZANidine (ZANAFLEX) 4 MG tablet Take 1 tablet (4 mg total) by mouth every 6 (six) hours as needed for muscle spasms. Do Not Fill Before 06/09/2022 09/18/22   Horton Chin, MD  tizanidine (ZANAFLEX) 6 MG capsule Take 6 mg by mouth 3 (three) times daily. 09/09/22   [provider]  tretinoin (RETIN-A) 0.025 % cream Apply topically. 08/12/22   [provider]      Allergies    Elavil [amitriptyline], Orudis [ketoprofen], Desyrel [trazodone], Aspirin, Motrin [ibuprofen], Naprosyn [naproxen],  Neurontin [gabapentin], Sulfa antibiotics, Ultram [tramadol], Victoza [liraglutide], and Zegerid [omeprazole-sodium bicarbonate]    Review of Systems   Review of Systems  Constitutional:  Negative for chills and fever.  HENT:  Negative for ear pain and sore throat.   Eyes:  Negative for pain and visual disturbance.  Respiratory:  Negative for cough and shortness of breath.   Cardiovascular:  Negative for chest pain and palpitations.  Gastrointestinal:  Positive for abdominal pain, nausea and vomiting.  Genitourinary:  Negative for dysuria and hematuria.  Musculoskeletal:  Negative for arthralgias and back pain.  Skin:  Negative for color change and rash.  Neurological:  Negative for seizures and syncope.  All other systems reviewed and are negative.   Physical Exam Updated Vital Signs BP (!) 202/94   Pulse 93   Temp 99.7 F (37.6 C) (Oral)   Resp (!) 24   SpO2 97%  Physical Exam Vitals and nursing note reviewed.  Constitutional:      General: She is not in acute distress.    Appearance: She is well-developed.  HENT:     Head: Normocephalic and atraumatic.  Eyes:     Conjunctiva/sclera: Conjunctivae  normal.  Cardiovascular:     Rate and Rhythm: Normal rate and regular rhythm.     Heart sounds: No murmur heard. Pulmonary:     Effort: Pulmonary effort is normal. No respiratory distress.     Breath sounds: Normal breath sounds.  Abdominal:     General: There is no distension.     Palpations: Abdomen is soft.     Tenderness: There is generalized abdominal tenderness. There is no guarding.  Musculoskeletal:        General: No swelling.     Cervical back: Neck supple.  Skin:    General: Skin is warm and dry.     Capillary Refill: Capillary refill takes less than 2 seconds.  Neurological:     General: No focal deficit present.     Mental Status: She is alert and oriented to person, place, and time.  Psychiatric:        Mood and Affect: Mood normal.     ED Results / Procedures / Treatments   Labs (all labs ordered are listed, but only abnormal results are displayed) Labs Reviewed  LIPASE, BLOOD - Abnormal; Notable for the following components:      Result Value   Lipase <10 (*)    All other components within normal limits  COMPREHENSIVE METABOLIC PANEL - Abnormal; Notable for the following components:   Glucose, Bld 167 (*)    Creatinine, Ser 1.32 (*)    AST 58 (*)    GFR, Estimated 51 (*)    All other components within normal limits  CBC - Abnormal; Notable for the following components:   WBC 15.5 (*)    RDW 17.2 (*)    All other components within normal limits    EKG None  Radiology DG Chest Port 1 View  Result Date: 09/18/2022 CLINICAL DATA:  Dyspnea EXAM: PORTABLE CHEST 1 VIEW COMPARISON:  08/22/2021 FINDINGS: Linear scarring within the right lung base. Lungs are otherwise clear. No pneumothorax or pleural effusion. Cardiac size is mildly enlarged, unchanged. Pulmonary vascularity is normal. No acute bone abnormality. IMPRESSION: 1. No active disease. Stable cardiomegaly. Electronically Signed   By: Helyn Numbers M.D.   On: 09/18/2022 20:19   CT Abdomen Pelvis W  Contrast  Result Date: 09/18/2022 CLINICAL DATA:  Abdominal pain, acute, nonlocalized.  Vomiting. EXAM:  CT ABDOMEN AND PELVIS WITH CONTRAST TECHNIQUE: Multidetector CT imaging of the abdomen and pelvis was performed using the standard protocol following bolus administration of intravenous contrast. RADIATION DOSE REDUCTION: This exam was performed according to the departmental dose-optimization program which includes automated exposure control, adjustment of the mA and/or kV according to patient size and/or use of iterative reconstruction technique. CONTRAST:  OMNIPAQUE IOHEXOL 300 MG/ML  SOLN COMPARISON:  None Available. FINDINGS: Lower chest: No acute abnormality. Hepatobiliary: No focal liver abnormality is seen. No gallstones, gallbladder wall thickening, or biliary dilatation. Pancreas: Atrophic but otherwise unremarkable Spleen: Unremarkable Adrenals/Urinary Tract: Adrenal glands are unremarkable. Kidneys are normal, without renal calculi, focal lesion, or hydronephrosis. Bladder is unremarkable. Stomach/Bowel: Evaluation within the upper abdomen is slightly limited by motion artifact. Despite this, the stomach, small bowel, and large bowel are unremarkable. Appendix normal. No free intraperitoneal gas or fluid. Vascular/Lymphatic: No significant vascular findings are present. No enlarged abdominal or pelvic lymph nodes. Reproductive: Uterus and bilateral adnexa are unremarkable. Other: Diastasis of the rectus abdominus musculature with tiny superimposed fat containing umbilical hernia. The rectum is unremarkable. Musculoskeletal: No acute or significant osseous findings. IMPRESSION: 1. No acute intra-abdominal pathology identified. No definite radiographic explanation for the patient's reported symptoms. Electronically Signed   By: Helyn Numbers M.D.   On: 09/18/2022 20:00    Procedures Procedures    Medications Ordered in ED Medications  sodium chloride 0.9 % bolus 1,000 mL (1,000 mLs  Intravenous New Bag/Given 09/19/22 1235)  HYDROmorphone (DILAUDID) injection 1 mg (1 mg Intravenous Given 09/19/22 1239)  metoCLOPramide (REGLAN) injection 10 mg (10 mg Intravenous Given 09/19/22 1240)  HYDROmorphone (DILAUDID) injection 1 mg (1 mg Intravenous Given 09/19/22 1416)    ED Course/ Medical Decision Making/ A&P                           Medical Decision Making Amount and/or Complexity of Data Reviewed Labs: ordered.  Risk Prescription drug management.  Since labs here with significant improvement compared to yesterday.  Complete metabolic panel with marked improvement renal functions improved as well.  Lipase less than 10 CBC white count 15 but hemoglobin good at 13.3.  Patient had negative CT abdomen pelvis yesterday.  As stated in the HPI I think this is all narcotic withdrawal from her chronic pain medicine.  Patient treated here with IV fluids total of 2 mg of hydromorphone and Reglan and Zofran.  And patient after the second dose of pain medicine feeling much better.  Discussed with pharmacy here and they said they will not accept any prescription for the same kind of medicine but if it be a different type of pain medicine we could give her a short course of pain medicine to try to bridge her until December 8.  So that will be done.  Patient's understanding that.  This should help prevent her from coming back and so she can get back on her chronic medicine.  She will return for any new or worse symptoms Final Clinical Impression(s) / ED Diagnoses Final diagnoses:  Narcotic withdrawal (HCC)  Gastroparesis    Rx / DC Orders ED Discharge Orders          Ordered    ondansetron (ZOFRAN-ODT) 4 MG disintegrating tablet  Every 8 hours PRN        09/19/22 1528    HYDROcodone-acetaminophen (NORCO/VICODIN) 5-325 MG tablet  Every 6 hours PRN  09/19/22 1528              Vanetta Mulders, MD 09/19/22 1536

## 2022-09-19 NOTE — ED Notes (Signed)
Pt her pain has returned - "in the beginning it helped but it came back."   Pt awake but groggy.

## 2022-09-19 NOTE — Telephone Encounter (Signed)
Dr Carlis Abbott sent the prescription on 09/18/2022, She knows we don't do early refills

## 2022-09-19 NOTE — ED Notes (Signed)
Sent EDP a message regarding BP; pt states she has been unable to keep meds down so she has not had bp meds in 3 days.

## 2022-09-19 NOTE — Telephone Encounter (Signed)
Tried to call patient back twice. No answer.

## 2022-09-19 NOTE — Telephone Encounter (Signed)
Dr. Carlis Abbott is off today. Please advise.   Dr. Carlis Abbott sent in [name]'s Oxycodone yesterday.   Jill Shaw was seen in the ER yesterday with Nausea, Vomiting & Abdominal pain. She said she just got home this morning. And need her pain medication available today.  The pharmacy release date is 09/22/2022. Can you fix the Rx, so she can pick it up today?

## 2022-09-20 ENCOUNTER — Telehealth: Payer: Self-pay

## 2022-09-20 ENCOUNTER — Other Ambulatory Visit: Payer: Self-pay

## 2022-09-20 NOTE — Telephone Encounter (Signed)
Transition Care Management Unsuccessful Follow-up Telephone Call  Date of discharge and from where:  09/19/22  Attempts:  1st Attempt  Reason for unsuccessful TCM follow-up call:  Left voice message  Sharika Mosquera RMA     

## 2022-09-21 ENCOUNTER — Other Ambulatory Visit: Payer: Self-pay

## 2022-09-22 ENCOUNTER — Other Ambulatory Visit: Payer: Self-pay

## 2022-09-26 ENCOUNTER — Telehealth: Payer: Self-pay

## 2022-09-26 NOTE — Telephone Encounter (Signed)
Patient called the nurse line stated she is bleeding for a long time.   Felecia Shelling [facility]  09/26/2022

## 2022-09-27 NOTE — Telephone Encounter (Signed)
Called patient, no answer- left message stating we are trying to reach you to return your phone call, please call us back if you still need assistance.  

## 2022-09-28 ENCOUNTER — Other Ambulatory Visit: Payer: Self-pay

## 2022-09-28 ENCOUNTER — Encounter: Payer: Medicare Other | Attending: Physical Medicine and Rehabilitation | Admitting: Registered Nurse

## 2022-09-28 VITALS — BP 109/72 | HR 86 | Ht 64.0 in | Wt 277.0 lb

## 2022-09-28 DIAGNOSIS — M62838 Other muscle spasm: Secondary | ICD-10-CM | POA: Insufficient documentation

## 2022-09-28 DIAGNOSIS — G629 Polyneuropathy, unspecified: Secondary | ICD-10-CM | POA: Diagnosis present

## 2022-09-28 DIAGNOSIS — Z5181 Encounter for therapeutic drug level monitoring: Secondary | ICD-10-CM | POA: Diagnosis present

## 2022-09-28 DIAGNOSIS — G894 Chronic pain syndrome: Secondary | ICD-10-CM | POA: Diagnosis present

## 2022-09-28 DIAGNOSIS — M170 Bilateral primary osteoarthritis of knee: Secondary | ICD-10-CM | POA: Diagnosis present

## 2022-09-28 DIAGNOSIS — M5416 Radiculopathy, lumbar region: Secondary | ICD-10-CM | POA: Diagnosis present

## 2022-09-28 DIAGNOSIS — Z79891 Long term (current) use of opiate analgesic: Secondary | ICD-10-CM | POA: Insufficient documentation

## 2022-09-28 MED ORDER — OXYCODONE-ACETAMINOPHEN 10-325 MG PO TABS
1.0000 | ORAL_TABLET | ORAL | 0 refills | Status: DC | PRN
  Filled 2022-09-28: qty 180, 30d supply, fill #0
  Filled 2022-10-17: qty 42, 7d supply, fill #0

## 2022-09-28 NOTE — Progress Notes (Signed)
Subjective:    Patient ID: Jill Shaw, female    DOB: 01-04-1976, 46 y.o.   MRN: [id number]  HPI: Jill Shaw is a 46 y.o. female who returns for follow up appointment for chronic pain and medication refill. She states her pain is located in her lower back radiating into her bilateral lower extremities, bilateral knee pain and tingling and burning in her bilateral feet.  She rates her pain 9. Her current exercise regime is walking and performing stretching exercises.  She went to [facility] ED on 09/19/2022, note was reviewed, she was seen for narcotic withdrawal.   Jill Shaw Morphine equivalent is 90.00 MME.   90.00. Last UDS was Performed on 07/04/2022, it was consistent.     Pain Inventory Average Pain 10 Pain Right Now 9 My pain is sharp, burning, dull, stabbing, tingling, and aching  In the last 24 hours, has pain interfered with the following? General activity 1 Relation with others 1 Enjoyment of life 1 What TIME of day is your pain at its worst? morning , daytime, evening, and night Sleep (in general) Fair  Pain is worse with: walking, bending, sitting, inactivity, standing, and some activites Pain improves with: heat/ice and medication Relief from Meds: 10  Family History  Problem Relation Age of Onset   Diabetes Mother    Hypertension Mother    Migraines Paternal Grandfather    Colon cancer Neg Hx    Esophageal cancer Neg Hx    Stomach cancer Neg Hx    Rectal cancer Neg Hx    Social History   Socioeconomic History   Marital status: Divorced    Spouse name: Not on file   Number of children: 3   Years of education: Not on file   Highest education level: Not on file  Occupational History   Occupation: unemployed  Tobacco Use   Smoking status: Some Days    Packs/day: 0.25    Years: 26.00    Total pack years: 6.50    Types: Cigarettes    Last attempt to quit: 09/13/2020    Years since quitting: 2.0   Smokeless tobacco: Never   Tobacco  comments:    4-5 cigarettes/day  Vaping Use   Vaping Use: Never used  Substance and Sexual Activity   Alcohol use: No   Drug use: No   Sexual activity: Not Currently    Birth control/protection: None  Other Topics Concern   Not on file  Social History Narrative   Right Handed   Lives in a one story apartment, but lives on the second floor   Drinks caffeine once in awhile   Social Determinants of Health   Financial Resource Strain: Not on file  Food Insecurity: Food Insecurity Present (12/08/2021)   Hunger Vital Sign    Worried About Running Out of Food in the Last Year: Never true    Ran Out of Food in the Last Year: Sometimes true  Transportation Needs: No Transportation Needs (12/08/2021)   PRAPARE - Administrator, Civil Service (Medical): No    Lack of Transportation (Non-Medical): No  Physical Activity: Not on file  Stress: Not on file  Social Connections: Not on file   Past Surgical History:  Procedure Laterality Date   CESAREAN SECTION     x 1. for twins   DILATION AND CURETTAGE OF UTERUS N/A 08/20/2019   Procedure: DILATATION AND CURETTAGE;  Surgeon: Allie Bossier, MD;  Location: [facility] OR;  Service:  Gynecology;  Laterality: N/A;   ENDOMETRIAL ABLATION N/A 08/20/2019   Procedure: Minerva Ablation;  Surgeon: Emily Filbert, MD;  Location: [place];  Service: Gynecology;  Laterality: N/A;   EYE SURGERY Bilateral    laser right and cataract removed left eye   LEFT HEART CATH AND CORONARY ANGIOGRAPHY N/A 08/30/2021   Procedure: LEFT HEART CATH AND CORONARY ANGIOGRAPHY;  Surgeon: Nigel Mormon, MD;  Location: [place] [facility];  Service: Cardiovascular;  Laterality: N/A;   RADIOLOGY WITH ANESTHESIA N/A 09/16/2019   Procedure: MRI WITH ANESTHESIA   L SPINE WITHOUT CONTRAST, T SPINE WITHOUT CONTRAST , CERVICAL WITHOUT CONTRAST;  Surgeon: Radiologist, Medication, MD;  Location: [place];  Service: Radiology;  Laterality: N/A;   TUBAL LIGATION     interval BTL   UPPER  GI ENDOSCOPY  07/2017   Past Surgical History:  Procedure Laterality Date   CESAREAN SECTION     x 1. for twins   DILATION AND CURETTAGE OF UTERUS N/A 08/20/2019   Procedure: DILATATION AND CURETTAGE;  Surgeon: Emily Filbert, MD;  Location: [place];  Service: Gynecology;  Laterality: N/A;   ENDOMETRIAL ABLATION N/A 08/20/2019   Procedure: Minerva Ablation;  Surgeon: Emily Filbert, MD;  Location: [place];  Service: Gynecology;  Laterality: N/A;   EYE SURGERY Bilateral    laser right and cataract removed left eye   LEFT HEART CATH AND CORONARY ANGIOGRAPHY N/A 08/30/2021   Procedure: LEFT HEART CATH AND CORONARY ANGIOGRAPHY;  Surgeon: Nigel Mormon, MD;  Location: [place] [facility];  Service: Cardiovascular;  Laterality: N/A;   RADIOLOGY WITH ANESTHESIA N/A 09/16/2019   Procedure: MRI WITH ANESTHESIA   L SPINE WITHOUT CONTRAST, T SPINE WITHOUT CONTRAST , CERVICAL WITHOUT CONTRAST;  Surgeon: Radiologist, Medication, MD;  Location: [place];  Service: Radiology;  Laterality: N/A;   TUBAL LIGATION     interval BTL   UPPER GI ENDOSCOPY  07/2017   Past Medical History:  Diagnosis Date   Anemia    Anxiety    Arthritis    knees, hands   Asthma    Chronic diastolic (congestive) heart failure (HCC)    COPD (chronic obstructive pulmonary disease) (HCC)    Diabetes mellitus ([place])    DKA (diabetic ketoacidosis) (HCC)    Dysfunctional uterine bleeding    Gastritis    GERD (gastroesophageal reflux disease)    Hypertension    Neuromuscular disorder ([place])    neuropathy feet   Seizures ([place]) 09/12/2017   pt states r/t stress and blood sugar - no meds last one 4 months ago, not seen neurologist   Sickle cell trait ([place])    Smoker    Vitamin D deficiency 10/2019   Wears glasses    BP 109/72   Pulse 86   Ht 5\' 4"  (1.626 m)   Wt 277 lb (125.6 kg)   SpO2 96%   BMI 47.55 kg/m   Opioid Risk Score:   Fall Risk Score:  `1  Depression screen PHQ 2/9     09/05/2022    2:45 PM 07/17/2022    11:04 AM 07/04/2022    8:58 AM 06/01/2022    9:05 AM 05/03/2022    1:52 PM 03/17/2022    2:08 PM 03/03/2022    1:30 PM  Depression screen PHQ 2/9  Decreased Interest 0 0 0 0 0 0 1  Down, Depressed, Hopeless 0 0 0 0 0 0 1  PHQ - 2 Score 0 0 0 0 0  0 2  Altered sleeping 2     0   Tired, decreased energy 2     0   Change in appetite 0     0   Feeling bad or failure about yourself  0     0   Trouble concentrating 0     0   Moving slowly or fidgety/restless 0     0   Suicidal thoughts 0     0   PHQ-9 Score 4     0      Review of Systems  Gastrointestinal:  Positive for abdominal pain.  Musculoskeletal:  Positive for back pain.       Bilateral shoulder, knee, feet pain  All other systems reviewed and are negative.     Objective:   Physical Exam Vitals and nursing note reviewed.  Constitutional:      Appearance: Normal appearance.  Cardiovascular:     Rate and Rhythm: Normal rate and regular rhythm.     Pulses: Normal pulses.     Heart sounds: Normal heart sounds.  Pulmonary:     Effort: Pulmonary effort is normal.     Breath sounds: Normal breath sounds.  Musculoskeletal:     Cervical back: Normal range of motion and neck supple.     Comments: Normal Muscle Bulk and Muscle Testing Reveals:  Upper Extremities: Decreased ROM 45 Degrees  and Muscle Strength 5/5  Thoracic Paraspinal Tenderness: T-7-T-9 Lumbar Hypersensitivity Lower Extremities: Full ROM and Muscle Strength 5/5 Arises from Table with ease Narrow Based Gait     Skin:    General: Skin is warm and dry.  Neurological:     Mental Status: She is alert and oriented to person, place, and time.  Psychiatric:        Mood and Affect: Mood normal.        Behavior: Behavior normal.         Assessment & Plan:  1. Lumbar Radiculitis: Continue current medication regimen. Continue HEP as Tolerated.09/28/2022 2. Fibromyalgia/Polyneuropathy: Continue HEP as Tolerated. Continue current Medication regimen. Continue to  Monitor. 09/28/2022 3. Bilateral Knee Pain: Continue HEP as tolerated. Continue to Monitor.09/28/2022 4. Chronic Pain Syndrome: Continue Oxycodone 10/325mg  one tablet 6 times  a day as needed for pain #180. We will continue the opioid monitoring program, this consists of regular clinic visits, examinations, urine drug screen, pill counts as well as use of [place] Controlled Substance Reporting system. A 12 month History has been reviewed on the [place] on 12/142023.  5.  Muscle Spasm: Continue Tizanidine: Continue to Monitor . 09/28/2022   F/U in 1 mo

## 2022-10-01 ENCOUNTER — Other Ambulatory Visit: Payer: Self-pay | Admitting: Pulmonary Disease

## 2022-10-01 ENCOUNTER — Other Ambulatory Visit: Payer: Self-pay | Admitting: Cardiology

## 2022-10-01 DIAGNOSIS — J449 Chronic obstructive pulmonary disease, unspecified: Secondary | ICD-10-CM

## 2022-10-01 DIAGNOSIS — I10 Essential (primary) hypertension: Secondary | ICD-10-CM

## 2022-10-02 ENCOUNTER — Telehealth: Payer: Self-pay | Admitting: Nurse Practitioner

## 2022-10-02 NOTE — Telephone Encounter (Signed)
Pt has refill request from [facility] for trizanidine- pt wanted to let you know she had misplaced her other bottle

## 2022-10-03 ENCOUNTER — Telehealth: Payer: Self-pay

## 2022-10-03 NOTE — Telephone Encounter (Signed)
Pt called and advised she is requesting to have her tizanidine filled. The 6 mg . Pt did tell me that she throughout the 4 mg because they were not the correct dose. Please advise [facility]

## 2022-10-04 ENCOUNTER — Telehealth: Payer: Self-pay

## 2022-10-04 ENCOUNTER — Other Ambulatory Visit: Payer: Self-pay | Admitting: Nurse Practitioner

## 2022-10-04 ENCOUNTER — Other Ambulatory Visit: Payer: Self-pay

## 2022-10-04 ENCOUNTER — Other Ambulatory Visit (HOSPITAL_COMMUNITY): Payer: Self-pay

## 2022-10-04 MED ORDER — TIZANIDINE HCL 6 MG PO CAPS
6.0000 mg | ORAL_CAPSULE | Freq: Three times a day (TID) | ORAL | 0 refills | Status: DC
  Filled 2022-10-04: qty 30, 10d supply, fill #0

## 2022-10-04 NOTE — Telephone Encounter (Signed)
Patient Advocate Encounter   Received notification from [facility] Part D that prior authorization is required for Dexlansoprazole 30MG  dr capsules  Submitted: 10-04-2022 Key [id number]  Status is pending

## 2022-10-04 NOTE — Telephone Encounter (Signed)
Patient Advocate Encounter  Prior Authorization for Dexlansoprazole 30MG  dr capsules has been approved.    Effective: 10-04-2022 to 08-05-2023

## 2022-10-05 ENCOUNTER — Ambulatory Visit: Payer: Self-pay | Admitting: Nurse Practitioner

## 2022-10-05 ENCOUNTER — Telehealth: Payer: Self-pay

## 2022-10-05 ENCOUNTER — Other Ambulatory Visit: Payer: Self-pay

## 2022-10-05 NOTE — Telephone Encounter (Signed)
Patient called in because she said she misplaced her meds and cannot find that and wants to know if you can do an early refill on the tizanidine

## 2022-10-06 ENCOUNTER — Other Ambulatory Visit: Payer: Self-pay | Admitting: Physical Medicine and Rehabilitation

## 2022-10-06 ENCOUNTER — Other Ambulatory Visit: Payer: Self-pay

## 2022-10-06 MED ORDER — TIZANIDINE HCL 6 MG PO CAPS
6.0000 mg | ORAL_CAPSULE | Freq: Three times a day (TID) | ORAL | 0 refills | Status: DC
  Filled 2022-10-06 – 2022-10-11 (×2): qty 30, 10d supply, fill #0

## 2022-10-10 ENCOUNTER — Other Ambulatory Visit: Payer: Self-pay

## 2022-10-11 ENCOUNTER — Other Ambulatory Visit: Payer: Self-pay

## 2022-10-11 NOTE — Telephone Encounter (Signed)
Effective 12.20.23 to 12.20.24

## 2022-10-12 ENCOUNTER — Other Ambulatory Visit: Payer: Self-pay

## 2022-10-12 ENCOUNTER — Encounter: Payer: Self-pay | Admitting: Physical Medicine and Rehabilitation

## 2022-10-13 ENCOUNTER — Other Ambulatory Visit: Payer: Self-pay

## 2022-10-13 ENCOUNTER — Telehealth: Payer: Self-pay | Admitting: Physical Medicine and Rehabilitation

## 2022-10-13 NOTE — Telephone Encounter (Signed)
Patient wishes to speak to Dr. Carlis Abbott.

## 2022-10-14 ENCOUNTER — Encounter: Payer: Self-pay | Admitting: Physical Medicine and Rehabilitation

## 2022-10-16 ENCOUNTER — Other Ambulatory Visit: Payer: Self-pay | Admitting: Physical Medicine and Rehabilitation

## 2022-10-16 ENCOUNTER — Encounter: Payer: Self-pay | Admitting: Physical Medicine and Rehabilitation

## 2022-10-17 ENCOUNTER — Encounter
Payer: Medicare Other | Attending: Physical Medicine and Rehabilitation | Admitting: Physical Medicine and Rehabilitation

## 2022-10-17 ENCOUNTER — Other Ambulatory Visit: Payer: Self-pay

## 2022-10-17 DIAGNOSIS — G629 Polyneuropathy, unspecified: Secondary | ICD-10-CM | POA: Diagnosis not present

## 2022-10-17 DIAGNOSIS — M5416 Radiculopathy, lumbar region: Secondary | ICD-10-CM | POA: Insufficient documentation

## 2022-10-17 MED ORDER — TIZANIDINE HCL 4 MG PO TABS: 4.0000 mg | ORAL_TABLET | Freq: Four times a day (QID) | ORAL | 3 refills | Status: DC | PRN

## 2022-10-17 NOTE — Progress Notes (Signed)
Subjective:    Patient ID: Jill Shaw, female    DOB: [birth year], 46 y.o.   MRN: [id number]   HPI:   An audio/video tele-health visit is felt to be the most appropriate encounter for this patient at this time. This is a follow up tele-visit via phone. The patient is at home. MD is at office. Prior to scheduling this appointment, our staff discussed the limitations of evaluation and management by telemedicine and the availability of in-person appointments. The patient expressed understanding and agreed to proceed. Jill Shaw is a 47 y.o. female who returns for f/u of inflammatory gastritis, chronic pain, diabetic peripheral neuropathy, and insomnia.   1) Insomnia: -She continues to experience insomnia at night. Asks about Ambien and I discussed that this is an addictive medication and there are other safer options we can try first that can also help with her pain.  -She is currently not taking either of these medications, or Amitriptyline or Trazodone.  -She takes Requip for resltless legs but this does not help. -She is still sleeping very poorly -She does use screens before bed time  2) Back pain secondary to lumbar radiculitis.  -She states her pain is located in her lower back radiating into her bilateral lower extremities and bilateral knee pain. She denies falling. She rates her pain 9. Her current exercise regime is walking.  -Jill Shaw Morphine equivalent is 20.00 MME.  -She was hospitalized for serotonin syndrome after use of Savella and Cymbalta.  -Back pain has been severe -She requests a renewal of her handicap placard as she is unable to walk 200 feet without stopping to rest.  -Her pain has been better controlled with Norco -she would like to get another XR given the chronicity of her pain.  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  -she asks whether her oxycodone can be increased to 6 tablets per day. -they only had a 1 week supply available for her right now.  -she asks whether her oxycodone can be released early as she is going on vacation next week.  -She tolerated Qutenza well and this provided 2 weeks of good relief, benefit started right away.  -Her peripheral neuropathy is worse in her bilateral lower extremities, especially the dorsum and soles of both feet, including the toes.  -now present in her hands as well- she has been dropping objects due to her neuropathy- present on the dorsal and ventral aspects of her hands -she is interested in following with a dietician -she is interested in medicines for her diabetes -she is trying to lose weight  -needs handicap placard  4) Right shoulder and arm pain -notes limited range of motion in her shoulder -does have pain radiating into right arm from neck and arm feels heavy at times  5) Inflammatory gastritis -has been back and forth to the hospital as this has been really hurting her. She was given pain medication in the hospital. She was given medication for emesis.  -flexeril is making her sicker.  --patient says she has recently been diagnosed with this condition and pain has been severe. -she has  her gastroenterologist recommended that she discuss with [place] increasing her Percocet to better control her pain -she is currently prescribed 3 Percocet per day but feels she would benefit from up to 5 per day  6) GERD -she can have severe reflex at times -she was started on pepcid for a short time period to help with this.   7) Morbid obesity -BMI 47.03 -weight is 274 lbs -she lose 40 lbs when she was sick since she could not tolerate food but she has gained a lot of this  back -she asks about supplement she can take for weight loss  Pain Inventory Average Pain 9 Pain Right Now 9 My pain is sharp, burning, tingling and aching  In the last 24 hours, has pain interfered with the following? General activity 0 Relation with others 0 Enjoyment of life 5 What TIME of day is your pain at its worst? morning , daytime, evening and night Sleep (in general) Poor  Pain is worse with: walking, bending, sitting, inactivity, standing and some activites Pain improves with: heat/ice and medication Relief from Meds: 10  Family History  Problem Relation Age of Onset   Diabetes Mother    Hypertension Mother    Migraines Paternal Grandfather    Colon cancer Neg Hx    Esophageal cancer Neg Hx    Stomach cancer Neg Hx    Rectal cancer Neg Hx    Social History   Socioeconomic History   Marital status: Divorced    Spouse name: Not on file   Number of children: 3   Years of education: Not on file   Highest education level: Not on file  Occupational History   Occupation: unemployed  Tobacco Use   Smoking status: Some Days    Packs/day: 0.25    Years: 26.00    Total pack years: 6.50    Types: Cigarettes    Last attempt to quit: 09/13/2020    Years since quitting: 2.0   Smokeless tobacco: Never   Tobacco comments:    4-5 cigarettes/day  Vaping Use   Vaping Use: Never used  Substance and Sexual Activity   Alcohol use: No   Drug use: No   Sexual activity: Not Currently    Birth control/protection: None  Other Topics Concern   Not on file  Social History Narrative   Right Handed   Lives in a one story apartment, but lives on the second floor   Drinks caffeine once in awhile   Social Determinants of Health   Financial Resource Strain: Not on file  Food Insecurity: Food Insecurity Present (12/08/2021)   Hunger Vital Sign    Worried About Running Out of Food in the Last Year: Never true    Ran Out of Food in the Last Year: Sometimes true   Transportation Needs: No Transportation Needs (12/08/2021)   PRAPARE - Administrator, Civil Service (Medical): No    Lack of Transportation (Non-Medical): No  Physical Activity: Not on file  Stress: Not on file  Social Connections: Not on file   Past Surgical History:  Procedure Laterality Date   CESAREAN SECTION     x 1. for twins   DILATION AND CURETTAGE OF UTERUS N/A 08/20/2019   Procedure: DILATATION AND CURETTAGE;  Surgeon: Allie Bossier, MD;  Location: [facility];  Service: Gynecology;  Laterality: N/A;   ENDOMETRIAL ABLATION N/A 08/20/2019   Procedure: Minerva Ablation;  Surgeon: Allie Bossier, MD;  Location: [facility];  Service:  Gynecology;  Laterality: N/A;   EYE SURGERY Bilateral    laser right and cataract removed left eye   LEFT HEART CATH AND CORONARY ANGIOGRAPHY N/A 08/30/2021   Procedure: LEFT HEART CATH AND CORONARY ANGIOGRAPHY;  Surgeon: Elder Negus, MD;  [facility];  Service: Cardiovascular;  Laterality: N/A;   RADIOLOGY WITH ANESTHESIA N/A 09/16/2019   Procedure: MRI WITH ANESTHESIA   L SPINE WITHOUT CONTRAST, T SPINE WITHOUT CONTRAST , CERVICAL WITHOUT CONTRAST;  Surgeon: Radiologist, Medication, MD;  Location[facility];  Service: Radiology;  Laterality: N/A;   TUBAL LIGATION     interval BTL   UPPER GI ENDOSCOPY  07/2017   Past Surgical History:  Procedure Laterality Date   CESAREAN SECTION     x 1. for twins   DILATION AND CURETTAGE OF UTERUS N/A 08/20/2019   Procedure: DILATATION AND CURETTAGE;  Surgeon: Allie Bossier, MD;  [facility];  Service: Gynecology;  Laterality: N/A;   ENDOMETRIAL ABLATION N/A 08/20/2019   Procedure: Minerva Ablation;  Surgeon: Allie Bossier, MD;  [facility];  Service: Gynecology;  Laterality: N/A;   EYE SURGERY Bilateral    laser right and cataract removed left eye   LEFT HEART CATH AND CORONARY ANGIOGRAPHY N/A 08/30/2021   Procedure: LEFT HEART CATH AND CORONARY ANGIOGRAPHY;  Surgeon: Elder Negus, MD;  [facility];  Service: Cardiovascular;  Laterality: N/A;   RADIOLOGY WITH ANESTHESIA N/A 09/16/2019   Procedure: MRI WITH ANESTHESIA   L SPINE WITHOUT CONTRAST, T SPINE WITHOUT CONTRAST , CERVICAL WITHOUT CONTRAST;  Surgeon: Radiologist, Medication, MD;  [facility];  Service: Radiology;  Laterality: N/A;   TUBAL LIGATION     interval BTL   UPPER GI ENDOSCOPY  07/2017   Past Medical History:  Diagnosis Date   Anemia    Anxiety    Arthritis    knees, hands   Asthma    Chronic diastolic (congestive) heart failure (HCC)    COPD (chronic obstructive pulmonary disease) (HCC)    Diabetes mellitus (HCC)    DKA (diabetic ketoacidosis) (HCC)    Dysfunctional uterine bleeding    Gastritis    GERD (gastroesophageal reflux disease)    Hypertension    Neuromuscular disorder (HCC)    neuropathy feet   Seizures (HCC) 09/12/2017   pt states r/t stress and blood sugar - no meds last one 4 months ago, not seen neurologist   Sickle cell trait (HCC)    Smoker    Vitamin D deficiency 10/2019   Wears glasses    There were no vitals taken for this visit.  Opioid Risk Score:   Fall Risk Score:  `1  Depression screen PHQ 2/9     09/05/2022    2:45 PM 07/17/2022   11:04 AM 07/04/2022    8:58 AM 06/01/2022    9:05 AM 05/03/2022    1:52 PM 03/17/2022    2:08 PM 03/03/2022    1:30 PM  Depression screen PHQ 2/9  Decreased Interest 0 0 0 0 0 0 1  Down, Depressed, Hopeless 0 0 0 0 0 0 1  PHQ - 2 Score 0 0 0 0 0 0 2  Altered sleeping 2     0   Tired, decreased energy 2     0   Change in appetite 0     0   Feeling bad or failure about yourself  0     0  Trouble concentrating 0     0   Moving slowly or fidgety/restless 0     0   Suicidal thoughts 0     0   PHQ-9 Score 4     0      Review of Systems  Constitutional:  Positive for diaphoresis and unexpected weight change.  Respiratory:  Positive for shortness of breath and wheezing.   Gastrointestinal:   Positive for abdominal pain, constipation and nausea.  Musculoskeletal:  Positive for arthralgias, back pain, gait problem and myalgias.  Neurological:  Positive for dizziness, weakness and numbness.  Hematological:  Bruises/bleeds easily.  Psychiatric/Behavioral:  Positive for confusion and decreased concentration. The patient is nervous/anxious.   All other systems reviewed and are negative.      Objective:  Gen: no distress, normal appearing, weight 274 lbs, BMI 47.03, BP 148/87.  HEENT: oral mucosa pink and moist, NCAT Cardio: Reg rate Chest: normal effort, normal rate of breathing Abd: soft, non-distended Ext: no edema Psych: pleasant, normal affect Skin: intact, no open lesions Neuro: Alert and oriented x3     Assessment & Plan:  1. Lumbar Radiculitis: Continue current medication regimen. Continue HEP as Tolerated. Provided with handicap placard. XR ordered. Note for work provided via Pharmacist, community.  2. Fibromyalgia: Continue HEP as Tolerated. Continue current Medication regimen. Continue to Monitor.  3. Bilateral Knee Pain: Right knee is currently worst, XR ordered and will call with results.  Continue to Monitor.  4. Chronic Pain Syndrome: Refilled Percocet. Discussed that we cannot send early refills if medications are lost. Increase percocet to 6 tabs per day prn  -provided note explaining her inability to work due to her pain 5. Insomnia: -Try to go outside near sunrise -Get exercise during the day.  -Discussed good sleep hygiene: turning off all devices an hour before bedtime.  -Chamomile tea with dinner.  -Melatonin did for help. -warm bath or shower before bed. -apply lavender oil for forehead at night  6. Diabetic peripheral neuropathy -checked with Qutenza rep and medicaid/medicare will not cover Qutenza more frequently than every 3 months -will request 6 patches next visit to cover her hands as well -refilled tizanidine -refilled percocet and they were only able to  give her a 1 week supply, she will call when she needs the remainder filled -Discussed Qutenza as an option for neuropathic pain control. Discussed that this is a capsaicin patch, stronger than capsaicin cream. Discussed that it is currently approved for diabetic peripheral neuropathy and post-herpetic neuralgia, but that it has also shown benefit in treating other forms of neuropathy. Provided patient with link to site to learn more about the patch: [employer].fr. Discussed that the patch would be placed in office and benefits usually last 3 months. Discussed that unintended exposure to capsaicin can cause severe irritation of eyes, mucous membranes, respiratory tract, and skin, but that Qutenza is a local treatment and does not have the systemic side effects of other nerve medications. Discussed that there may be pain, itching, erythema, and decreased sensory function associated with the application of Qutenza. Side effects usually subside within 1 week. A cold pack of analgesic medications can help with these side effects. Blood pressure can also be increased due to pain associated with administration of the patch.   7. Right sided shoulder and arm pain -discussed that could be a component or frozen shoulder and/or cervical radiculitis -will obtain right shoulder XR and cervical spine XR and call when results are available  8. Obesity: -Educated  that current weight is 274 lbs and current BMI is 47.03 -shared foods that can assist in weight loss: 1) leafy greens- high in fiber and nutrients 2) dark chocolate- improves metabolism (if prefer sweetened, best to sweeten with honey instead of sugar).  3) cruciferous vegetables- high in fiber and protein 4) full fat yogurt: high in healthy fat, protein, calcium, and probiotics 5) apples- high in a variety of phytochemicals 6) nuts- high in fiber and protein that increase feelings of fullness 7) grapefruit: rich in nutrients, antioxidants, and  fiber (not to be taken with anticoagulation) 8) beans- high in protein and fiber 9) salmon- has high quality protein and healthy fats 10) green tea- rich in polyphenols 11) eggs- rich in choline and vitamin D 12) tuna- high protein, boosts metabolism 13) avocado- decreases visceral abdominal fat 14) chicken (pasture raised): high in protein and iron 15) blueberries- reduce abdominal fat and cholesterol 16) whole grains- decreases calories retained during digestion, speeds metabolism 17) chia seeds- curb appetite 18) chilies- increases fat metabolism  -Discussed supplements that can be used:  1) Metatrim 400mg  BID 30 minutes before breakfast and dinner  2) Sphaeranthus indicus and Garcinia mangostana (combinations of these and #1 can be found in capsicum and zychrome  3) green coffee bean extract 400mg  twice per day or Irvingia (african mango) 150 to 300mg  twice per day.    9) Gastroesophageal reflux disease  -discussed the interaction between Tizanidine and Pepcid and that their additive effects could cause hypotension and bradycardia. -recommended eating small meals -recommended avoiding acidic and spicy foods -recommended apple cider vinegar in a cup of water before meals.   10) Inflammatory gastritis: -recommended following up with GI   5 minutes spent in discussion with patient about her upcoming trip and that she needs to pick up her percocet early, but the pharmacy only had enough to give her a 1 week supply, so she will call at the end of the week when she needs more.

## 2022-10-19 ENCOUNTER — Telehealth: Payer: Self-pay | Admitting: Physical Medicine and Rehabilitation

## 2022-10-19 ENCOUNTER — Other Ambulatory Visit: Payer: Self-pay

## 2022-10-19 NOTE — Telephone Encounter (Signed)
Filled  Written  ID  Drug  QTY  Days  Prescriber  RX #  Dispenser  Refill  Daily Dose*  Pymt Type  PMP  10/17/2022 09/28/2022 1  Oxycodone-Acetaminophen 10-325 42.00 7 Eu Tho [id number] The (4773) 0/0 90.00 MME Other [place] 09/22/2022 09/18/2022 1  Oxycodone-Acetaminophen 10-325 180.00 30 Kr Rau [id number] The (7654) 0/0 90.00 MME Other [place] 09/19/2022 09/19/2022 1  Hydrocodone-Acetamin 5-325 Mg 12.00 3 Walker Zac [id number] Con (7708) 0/0 20.00 MME Other [place]  I called the [facility] they gave her a 7 day supply. She will need new script sent.

## 2022-10-19 NOTE — Telephone Encounter (Signed)
Patient called back and states pharmacy is needing prior auth for medication.

## 2022-10-19 NOTE — Telephone Encounter (Signed)
Needs new script for oxycodone to be sent to pharmacy.  They were only able to refill some of the previous script.

## 2022-10-20 ENCOUNTER — Other Ambulatory Visit: Payer: Self-pay | Admitting: Physical Medicine and Rehabilitation

## 2022-10-20 ENCOUNTER — Other Ambulatory Visit: Payer: Self-pay

## 2022-10-20 MED ORDER — OXYCODONE-ACETAMINOPHEN 10-325 MG PO TABS
1.0000 | ORAL_TABLET | ORAL | 0 refills | Status: DC | PRN
  Filled 2022-10-20 – 2022-10-23 (×2): qty 30, 5d supply, fill #0
  Filled ????-??-??: fill #0

## 2022-10-23 ENCOUNTER — Other Ambulatory Visit: Payer: Self-pay

## 2022-10-23 ENCOUNTER — Encounter: Payer: Self-pay | Admitting: Obstetrics and Gynecology

## 2022-10-23 ENCOUNTER — Ambulatory Visit (INDEPENDENT_AMBULATORY_CARE_PROVIDER_SITE_OTHER): Payer: Medicaid Other | Admitting: Obstetrics and Gynecology

## 2022-10-23 VITALS — BP 161/69 | HR 85 | Wt 279.6 lb

## 2022-10-23 DIAGNOSIS — N939 Abnormal uterine and vaginal bleeding, unspecified: Secondary | ICD-10-CM | POA: Diagnosis not present

## 2022-10-23 MED ORDER — MEGESTROL ACETATE 40 MG PO TABS
ORAL_TABLET | ORAL | 3 refills | Status: DC
  Filled 2022-10-23: qty 90, 45d supply, fill #0
  Filled 2022-12-13 – 2022-12-20 (×2): qty 90, 45d supply, fill #1
  Filled 2023-02-05: qty 90, 45d supply, fill #2
  Filled 2023-03-19: qty 90, 45d supply, fill #3

## 2022-10-23 NOTE — Progress Notes (Signed)
GYNECOLOGY VISIT  Patient name: MADIE CAHN MRN [id number]  Date of birth: [birth year] Chief Complaint:   Gynecologic Exam  History:  Jill Shaw is a 47 y.o. G3P0 being seen today for AUB.  Presents stating she thought she was due for depo due to non-stopping bleeding. States she thought she was due for her next depo due to nonstop bleeding for 2 weeks. Has been taking norethindrone multiple times a day. Currently has severe pain in her back and legs for which she usually takes oxycodone. States her muscle relaxer doesn't work anymore either. Does not want any exam today while she is bleeding.   Past Medical History:  Diagnosis Date   Anemia    Anxiety    Arthritis    knees, hands   Asthma    Chronic diastolic (congestive) heart failure (HCC)    COPD (chronic obstructive pulmonary disease) (HCC)    Diabetes mellitus (HCC)    DKA (diabetic ketoacidosis) (HCC)    Dysfunctional uterine bleeding    Gastritis    GERD (gastroesophageal reflux disease)    Hypertension    Neuromuscular disorder (HCC)    neuropathy feet   Seizures (HCC) 09/12/2017   pt states r/t stress and blood sugar - no meds last one 4 months ago, not seen neurologist   Sickle cell trait (HCC)    Smoker    Vitamin D deficiency 10/2019   Wears glasses     Past Surgical History:  Procedure Laterality Date   CESAREAN SECTION     x 1. for twins   DILATION AND CURETTAGE OF UTERUS N/A 08/20/2019   Procedure: DILATATION AND CURETTAGE;  Surgeon: Allie Bossier, MD;  [facility];  Service: Gynecology;  Laterality: N/A;   ENDOMETRIAL ABLATION N/A 08/20/2019   Procedure: Minerva Ablation;  Surgeon: Allie Bossier, MD;  [facility];  Service: Gynecology;  Laterality: N/A;   EYE SURGERY Bilateral    laser right and cataract removed left eye   LEFT HEART CATH AND CORONARY ANGIOGRAPHY N/A 08/30/2021   Procedure: LEFT HEART CATH AND CORONARY ANGIOGRAPHY;  Surgeon: Elder Negus, MD;  [facility];  Service: Cardiovascular;  Laterality: N/A;   RADIOLOGY WITH ANESTHESIA N/A 09/16/2019   Procedure: MRI WITH ANESTHESIA   L SPINE WITHOUT CONTRAST, T SPINE WITHOUT CONTRAST , CERVICAL WITHOUT CONTRAST;  Surgeon: Radiologist, Medication, MD;  [facility];  Service: Radiology;  Laterality: N/A;   TUBAL LIGATION     interval BTL   UPPER GI ENDOSCOPY  07/2017    The following portions of the patient's history were reviewed and updated as appropriate: allergies, current medications, past family history, past medical history, past social history, past surgical history and problem list.   Health Maintenance:   Last pap 07/2021. Results were: NILM w/ HRHPV negative.  Last mammogram: 12/2021. Results were: normal.   Review of Systems:  Pertinent items are noted in HPI. Comprehensive review of systems was otherwise negative.   Objective:  Physical Exam BP (!) 161/69   Pulse 85   Wt 279 lb 9.6 oz (126.8 kg)   BMI 47.99 kg/m    Physical Exam Vitals and nursing note reviewed.  Constitutional:      Comments: Physically uncomfortable  Neurological:     Mental Status: She is alert.    Labs and Imaging No results found.     Assessment & Plan:   1. Abnormal uterine bleeding (AUB) Having heavy menstrual bleeding  despite depo and norethindrone. Will switch to megace to see if that helps. Order for pelvic US to assess for structural changes given continued bleeding and hx of ablation.   - megestrol (MEGACE) 40 MG tablet; Take two tablets three times daily for 3 days, then two tablets twice daily  Dispense: 90 tablet; Refill: 3 - US PELVIC COMPLETE WITH TRANSVAGINAL; Future  Routine preventative health maintenance measures emphasized.  Darliss Cheney, MD [facility] [place]

## 2022-10-23 NOTE — Progress Notes (Deleted)
    GYNECOLOGY VISIT  Patient name: Jill Shaw MRN [id number]  Date of birth: 06-Aug-1976 Chief Complaint:   Gynecologic Exam   History:  Jill Shaw is a 47 y.o. G3P0 being seen today for ***.    Past Medical History:  Diagnosis Date   Anemia    Anxiety    Arthritis    knees, hands   Asthma    Chronic diastolic (congestive) heart failure (HCC)    COPD (chronic obstructive pulmonary disease) (HCC)    Diabetes mellitus ([place])    DKA (diabetic ketoacidosis) (HCC)    Dysfunctional uterine bleeding    Gastritis    GERD (gastroesophageal reflux disease)    Hypertension    Neuromuscular disorder ([place])    neuropathy feet   Seizures ([place]) 09/12/2017   pt states r/t stress and blood sugar - no meds last one 4 months ago, not seen neurologist   Sickle cell trait ([place])    Smoker    Vitamin D deficiency 10/2019   Wears glasses     Past Surgical History:  Procedure Laterality Date   CESAREAN SECTION     x 1. for twins   DILATION AND CURETTAGE OF UTERUS N/A 08/20/2019   Procedure: DILATATION AND CURETTAGE;  Surgeon: Emily Filbert, MD;  Location: [place] [facility];  Service: Gynecology;  Laterality: N/A;   ENDOMETRIAL ABLATION N/A 08/20/2019   Procedure: Minerva Ablation;  Surgeon: Emily Filbert, MD;  Location: [place];  Service: Gynecology;  Laterality: N/A;   EYE SURGERY Bilateral    laser right and cataract removed left eye   LEFT HEART CATH AND CORONARY ANGIOGRAPHY N/A 08/30/2021   Procedure: LEFT HEART CATH AND CORONARY ANGIOGRAPHY;  Surgeon: Nigel Mormon, MD;  Location: [place] [facility];  Service: Cardiovascular;  Laterality: N/A;   RADIOLOGY WITH ANESTHESIA N/A 09/16/2019   Procedure: MRI WITH ANESTHESIA   L SPINE WITHOUT CONTRAST, T SPINE WITHOUT CONTRAST , CERVICAL WITHOUT CONTRAST;  Surgeon: Radiologist, Medication, MD;  Location: [facility];  Service: Radiology;  Laterality: N/A;   TUBAL LIGATION     interval BTL   UPPER GI ENDOSCOPY  07/2017    The following portions of  the patient's history were reviewed and updated as appropriate: allergies, current medications, past family history, past medical history, past social history, past surgical history and problem list.   Health Maintenance:   Last pap ***. Results were: {Pap findings:25134}. H/O abnormal pap: {yes/yes***/no:23866} Last mammogram: ***. Results were: {normal, abnormal, n/a:23837}. Family h/o breast cancer: {yes***/no:23838}   Review of Systems:  {Ros - complete:30496} Comprehensive review of systems was otherwise negative.   Objective:  Physical Exam BP (!) 161/69   Pulse 85   Wt 279 lb 9.6 oz (126.8 kg)   BMI 47.99 kg/m    Physical Exam   Labs and Imaging No results found.     Assessment & Plan:   1. Abnormal uterine bleeding (AUB) *** - megestrol (MEGACE) 40 MG tablet; Take two tablets three times daily for 3 days, then two tablets twice daily  Dispense: 90 tablet; Refill: 3 - US PELVIC COMPLETE WITH TRANSVAGINAL; Future    *** Routine preventative health maintenance measures emphasized.  Darliss Cheney, MD [facility] [name]

## 2022-10-24 ENCOUNTER — Other Ambulatory Visit (HOSPITAL_COMMUNITY): Payer: Self-pay

## 2022-10-24 ENCOUNTER — Encounter (HOSPITAL_BASED_OUTPATIENT_CLINIC_OR_DEPARTMENT_OTHER): Payer: Medicare Other | Admitting: Physical Medicine and Rehabilitation

## 2022-10-24 ENCOUNTER — Other Ambulatory Visit: Payer: Self-pay

## 2022-10-24 ENCOUNTER — Encounter: Payer: Self-pay | Admitting: Physical Medicine and Rehabilitation

## 2022-10-24 VITALS — BP 137/81 | HR 102 | Temp 98.4°F | Ht 64.0 in | Wt 277.0 lb

## 2022-10-24 DIAGNOSIS — M5416 Radiculopathy, lumbar region: Secondary | ICD-10-CM | POA: Diagnosis not present

## 2022-10-24 DIAGNOSIS — G629 Polyneuropathy, unspecified: Secondary | ICD-10-CM | POA: Diagnosis present

## 2022-10-24 MED ORDER — OXYCODONE-ACETAMINOPHEN 10-325 MG PO TABS
1.0000 | ORAL_TABLET | ORAL | 0 refills | Status: DC | PRN
  Filled 2022-10-24: qty 180, 30d supply, fill #0
  Filled ????-??-??: fill #0

## 2022-10-24 MED ORDER — OXYCODONE-ACETAMINOPHEN 10-325 MG PO TABS
1.0000 | ORAL_TABLET | ORAL | 0 refills | Status: DC | PRN
  Filled 2022-10-24: qty 140, 24d supply, fill #0
  Filled 2022-10-24: qty 140, 33d supply, fill #0

## 2022-10-24 MED ORDER — CAPSAICIN-CLEANSING GEL 8 % EX KIT
4.0000 | PACK | Freq: Once | CUTANEOUS | Status: AC
  Administered 2022-10-24: 4 via TOPICAL

## 2022-10-24 NOTE — Progress Notes (Signed)
Subjective:    Patient ID: Jill Shaw, female    DOB: 1975/12/25, 47 y.o.   MRN: [id number]   HPI:    Jill Shaw is a 47 y.o. female who returns for f/u of inflammatory gastritis, chronic pain, diabetic peripheral neuropathy, and insomnia.   1) Insomnia: -She continues to experience insomnia at night. Asks about Ambien and I discussed that this is an addictive medication and there are other safer options we can try first that can also help with her pain.  -She is currently not taking either of these medications, or Amitriptyline or Trazodone.  -She takes Requip for resltless legs but this does not help. -She is still sleeping very poorly -She does use screens before bed time  2) Back pain secondary to lumbar radiculitis.  -she has received 2 warnings for marijuana in her urine sample as well as for not bringing the number of correct pills to her appointments -She states her pain is located in her lower back radiating into her bilateral lower extremities and bilateral knee pain. She denies falling. She rates her pain 9. Her current exercise regime is walking.  -[name] Morphine equivalent is 20.00 MME.  -She was hospitalized for serotonin syndrome after use of Savella and Cymbalta.  -Back pain has been severe -She requests a renewal of her handicap placard as she is unable to walk 200 feet without stopping to rest.  -Her pain has been better controlled with Norco -she would like to get another XR given the chronicity of her pain.  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 -she finds great benefits in her legs and found benefit in her hands as well but she kept forgetting and touching her face and this caused her face to [place] -she is visiting  her mother who has a pneumonia and asks whether her medications can be filled early -she asks whether her oxycodone can be released early as she is going on vacation next week.  -She tolerated Qutenza well and this provided 2 weeks of good relief, benefit started right away.  -Her peripheral neuropathy is worse in her bilateral lower extremities, especially the dorsum and soles of both feet, including the toes.  -now present in her hands as well- she has been dropping objects due to her neuropathy- present on the dorsal and ventral aspects of her hands -she is interested in following with a dietician -she is interested in medicines for her diabetes -she is trying to lose weight  -needs handicap placard  4) Right shoulder and arm pain -notes limited range of motion in her shoulder -does have pain radiating into right arm from neck and arm feels heavy at times  5) Inflammatory gastritis -has been back and forth to the hospital as this has been really hurting her. She was given pain medication in the hospital. She was given medication for emesis.  -flexeril is making her sicker.  --patient says she has recently been diagnosed with this condition and pain has been severe. -she has her gastroenterologist recommended that she discuss with [place] increasing her Percocet to better control her pain -she is currently prescribed 3 Percocet per day but feels she would benefit from up to 5 per day  6) GERD -she  can have severe reflex at times -she was started on pepcid for a short time period to help with this.   7) Morbid obesity -BMI 47.03 -weight is 274 lbs -she lose 40 lbs when she was sick since she could not tolerate food but she has gained a lot of this back -she asks about supplement she can take for weight loss  Pain Inventory Average Pain 9 Pain Right Now 9 My pain is sharp, burning, tingling and aching  In the last 24 hours, has pain interfered with the following? General activity  0 Relation with others 0 Enjoyment of life 5 What TIME of day is your pain at its worst? morning , daytime, evening and night Sleep (in general) Poor  Pain is worse with: walking, bending, sitting, inactivity, standing and some activites Pain improves with: heat/ice and medication Relief from Meds: 49  Family History  Problem Relation Age of Onset   Diabetes Mother    Hypertension Mother    Migraines Paternal Grandfather    Colon cancer Neg Hx    Esophageal cancer Neg Hx    Stomach cancer Neg Hx    Rectal cancer Neg Hx    Social History   Socioeconomic History   Marital status: Divorced    Spouse name: Not on file   Number of children: 3   Years of education: Not on file   Highest education level: Not on file  Occupational History   Occupation: unemployed  Tobacco Use   Smoking status: Some Days    Packs/day: 0.25    Years: 26.00    Total pack years: 6.50    Types: Cigarettes    Last attempt to quit: 09/13/2020    Years since quitting: 2.1   Smokeless tobacco: Never   Tobacco comments:    4-5 cigarettes/day  Vaping Use   Vaping Use: Never used  Substance and Sexual Activity   Alcohol use: No   Drug use: No   Sexual activity: Not Currently    Birth control/protection: None  Other Topics Concern   Not on file  Social History Narrative   Right Handed   Lives in a one story apartment, but lives on the second floor   Drinks caffeine once in awhile   Social Determinants of Health   Financial Resource Strain: Not on file  Food Insecurity: Food Insecurity Present (12/08/2021)   Hunger Vital Sign    Worried About Running Out of Food in the Last Year: Never true    Ran Out of Food in the Last Year: Sometimes true  Transportation Needs: No Transportation Needs (12/08/2021)   PRAPARE - Hydrologist (Medical): No    Lack of Transportation (Non-Medical): No  Physical Activity: Not on file  Stress: Not on file  Social Connections: Not on  file   Past Surgical History:  Procedure Laterality Date   CESAREAN SECTION     x 1. for twins   DILATION AND CURETTAGE OF UTERUS N/A 08/20/2019   Procedure: DILATATION AND CURETTAGE;  Surgeon: Emily Filbert, MD;  Location: [facility];  Service: Gynecology;  Laterality: N/A;   ENDOMETRIAL ABLATION N/A 08/20/2019   Procedure: Minerva Ablation;  Surgeon: Emily Filbert, MD;  Location: [facility];  Service: Gynecology;  Laterality: N/A;   EYE SURGERY Bilateral    laser right and cataract removed left eye   LEFT HEART CATH AND CORONARY ANGIOGRAPHY N/A 08/30/2021   Procedure: LEFT HEART CATH AND CORONARY ANGIOGRAPHY;  Surgeon: Nigel Mormon, MD;  Location: [facility];  Service: Cardiovascular;  Laterality: N/A;   RADIOLOGY WITH ANESTHESIA N/A 09/16/2019   Procedure: MRI WITH ANESTHESIA   L SPINE WITHOUT CONTRAST, T SPINE WITHOUT CONTRAST , CERVICAL WITHOUT CONTRAST;  Surgeon: Radiologist, Medication, MD;  Location: [facility];  Service: Radiology;  Laterality: N/A;   TUBAL LIGATION     interval BTL   UPPER GI ENDOSCOPY  07/2017   Past Surgical History:  Procedure Laterality Date   CESAREAN SECTION     x 1. for twins   DILATION AND CURETTAGE OF UTERUS N/A 08/20/2019   Procedure: DILATATION AND CURETTAGE;  Surgeon: Emily Filbert, MD;  Location: [facility];  Service: Gynecology;  Laterality: N/A;   ENDOMETRIAL ABLATION N/A 08/20/2019   Procedure: Minerva Ablation;  Surgeon: Emily Filbert, MD;  Location: [facility];  Service: Gynecology;  Laterality: N/A;   EYE SURGERY Bilateral    laser right and cataract removed left eye   LEFT HEART CATH AND CORONARY ANGIOGRAPHY N/A 08/30/2021   Procedure: LEFT HEART CATH AND CORONARY ANGIOGRAPHY;  Surgeon: Nigel Mormon, MD;  Location: [facility];  Service: Cardiovascular;  Laterality: N/A;   RADIOLOGY WITH ANESTHESIA N/A 09/16/2019   Procedure: MRI WITH ANESTHESIA   L SPINE WITHOUT CONTRAST, T SPINE WITHOUT CONTRAST , CERVICAL WITHOUT CONTRAST;  Surgeon:  Radiologist, Medication, MD;  Location: [facility];  Service: Radiology;  Laterality: N/A;   TUBAL LIGATION     interval BTL   UPPER GI ENDOSCOPY  07/2017   Past Medical History:  Diagnosis Date   Anemia    Anxiety    Arthritis    knees, hands   Asthma    Chronic diastolic (congestive) heart failure (HCC)    COPD (chronic obstructive pulmonary disease) ([place])    Diabetes mellitus ([place])    DKA (diabetic ketoacidosis) (HCC)    Dysfunctional uterine bleeding    Gastritis    GERD (gastroesophageal reflux disease)    Hypertension    Neuromuscular disorder ([place])    neuropathy feet   Seizures ([place]) 09/12/2017   pt states r/t stress and blood sugar - no meds last one 4 months ago, not seen neurologist   Sickle cell trait ([place])    Smoker    Vitamin D deficiency 10/2019   Wears glasses    BP 137/81   Pulse (!) 102   Temp 98.4 F (36.9 C)   Ht 5\' 4"  (1.626 m)   Wt 277 lb (125.6 kg)   SpO2 96%   BMI 47.55 kg/m   Opioid Risk Score:   Fall Risk Score:  `1  Depression screen [facility] 2/9     10/24/2022   10:03 AM 09/05/2022    2:45 PM 07/17/2022   11:04 AM 07/04/2022    8:58 AM 06/01/2022    9:05 AM 05/03/2022    1:52 PM 03/17/2022    2:08 PM  Depression screen PHQ 2/9  Decreased Interest 0 0 0 0 0 0 0  Down, Depressed, Hopeless 0 0 0 0 0 0 0  PHQ - 2 Score 0 0 0 0 0 0 0  Altered sleeping  2     0  Tired, decreased energy  2     0  Change in appetite  0     0  Feeling bad or failure about yourself   0     0  Trouble concentrating  0     0  Moving slowly or fidgety/restless  0     0  Suicidal thoughts  0     0  PHQ-9 Score  4     0     Review of Systems  Constitutional:  Positive for diaphoresis and unexpected weight change.  Respiratory:  Positive for shortness of breath and wheezing.   Gastrointestinal:  Positive for abdominal pain, constipation and nausea.  Musculoskeletal:  Positive for arthralgias, back pain, gait problem and myalgias.  Neurological:  Positive for  dizziness, weakness and numbness.  Hematological:  Bruises/bleeds easily.  Psychiatric/Behavioral:  Positive for confusion and decreased concentration. The patient is nervous/anxious.   All other systems reviewed and are negative.      Objective:  Gen: no distress, normal appearing, weight 277 lbs, BMI 47.55, BP 137/81 HEENT: oral mucosa pink and moist, NCAT Cardio: Reg rate Chest: normal effort, normal rate of breathing Abd: soft, non-distended Ext: no edema Psych: pleasant, normal affect Skin: intact, no open lesions Neuro: Alert and oriented x3, improved sensation in feet!     Assessment & Plan:  1. Lumbar Radiculitis: Continue current medication regimen. Continue HEP as Tolerated. Provided with handicap placard. XR ordered. Note for work provided via Pharmacist, community.  2. Fibromyalgia: Continue HEP as Tolerated. Continue current Medication regimen. Continue to Monitor.  3. Bilateral Knee Pain: Right knee is currently worst, XR ordered and will call with results.  Continue to Monitor.  4. Chronic Pain Syndrome: Refilled Percocet. Discussed that we cannot send early refills if medications are lost. Increase percocet to 6 tabs per day prn. Will send final warning letter to patient today to say that we do not permit early refills, discussed this with her pharmacy as well -provided note explaining her inability to work due to her pain 5. Insomnia: -Try to go outside near sunrise -Get exercise during the day.  -Discussed good sleep hygiene: turning off all devices an hour before bedtime.  -Chamomile tea with dinner.  -Melatonin did for help. -warm bath or shower before bed. -apply lavender oil for forehead at night  6. Diabetic peripheral neuropathy -checked with Qutenza rep and medicaid/medicare will not cover Qutenza more frequently than every 3 months -will request 6 patches next visit to cover her hands as well -refilled tizanidine -refilled percocet -Discussed Qutenza as an option  for neuropathic pain control. Discussed that this is a capsaicin patch, stronger than capsaicin cream. Discussed that it is currently approved for diabetic peripheral neuropathy and post-herpetic neuralgia, but that it has also shown benefit in treating other forms of neuropathy. Provided patient with link to site to learn more about the patch: [employer]. Discussed that the patch would be placed in office and benefits usually last 3 months. Discussed that unintended exposure to capsaicin can cause severe irritation of eyes, mucous membranes, respiratory tract, and skin, but that Qutenza is a local treatment and does not have the systemic side effects of other nerve medications. Discussed that there may be pain, itching, erythema, and decreased sensory function associated with the application of Qutenza. Side effects usually subside within 1 week. A cold pack of analgesic medications can help with these side effects. Blood pressure can also be increased due to pain associated with administration of the patch.  4 patches of Qutenza was applied to the area of pain. Ice packs were applied during the procedure to ensure patient comfort. Blood pressure was monitored every 15 minutes. The patient tolerated the procedure well. Post-procedure instructions were given and follow-up has been scheduled.  7. Right sided shoulder and arm pain -discussed that could be a component or frozen shoulder and/or cervical radiculitis -will obtain right shoulder XR and cervical spine XR and call when results are available  8. Obesity: -Educated that current weight is 274 lbs and current BMI is 47.03 -shared foods that can assist in weight loss: 1) leafy greens- high in fiber and nutrients 2) dark chocolate- improves metabolism (if prefer sweetened, best to sweeten with honey instead of sugar).  3) cruciferous vegetables- high in fiber and protein 4) full fat yogurt: high in healthy fat, protein, calcium, and  probiotics 5) apples- high in a variety of phytochemicals 6) nuts- high in fiber and protein that increase feelings of fullness 7) grapefruit: rich in nutrients, antioxidants, and fiber (not to be taken with anticoagulation) 8) beans- high in protein and fiber 9) salmon- has high quality protein and healthy fats 10) green tea- rich in polyphenols 11) eggs- rich in choline and vitamin D 12) tuna- high protein, boosts metabolism 13) avocado- decreases visceral abdominal fat 14) chicken (pasture raised): high in protein and iron 15) blueberries- reduce abdominal fat and cholesterol 16) whole grains- decreases calories retained during digestion, speeds metabolism 17) chia seeds- curb appetite 18) chilies- increases fat metabolism  -Discussed supplements that can be used:  1) Metatrim 400mg  BID 30 minutes before breakfast and dinner  2) Sphaeranthus indicus and Garcinia mangostana (combinations of these and #1 can be found in capsicum and zychrome  3) green coffee bean extract 400mg  twice per day or Irvingia (african mango) 150 to 300mg  twice per day.    9) Gastroesophageal reflux disease  -discussed the interaction between Tizanidine and Pepcid and that their additive effects could cause hypotension and bradycardia. -recommended eating small meals -recommended avoiding acidic and spicy foods -recommended apple cider vinegar in a cup of water before meals.   10) Inflammatory gastritis: -recommended following up with GI   40 minutes spent in discussion of risks and benefits of Qutenza and obtaining informed consent, discussion of q90 day follow-up and expectation of improvement in pain with each repeat application, discussion of improved sensation, in communication with pharmacy about patient's need for an early refill so she can visit her mother who has a pneumonia, discussion with patient that we do not permit early refills and this will be her last one, discussion with team regarding  sending final warning letter to patient

## 2022-10-25 ENCOUNTER — Inpatient Hospital Stay: Admission: RE | Admit: 2022-10-25 | Payer: Medicare Other | Source: Ambulatory Visit

## 2022-10-31 ENCOUNTER — Other Ambulatory Visit: Payer: Self-pay | Admitting: Cardiology

## 2022-10-31 ENCOUNTER — Other Ambulatory Visit: Payer: Self-pay | Admitting: Pulmonary Disease

## 2022-10-31 DIAGNOSIS — J449 Chronic obstructive pulmonary disease, unspecified: Secondary | ICD-10-CM

## 2022-10-31 DIAGNOSIS — E782 Mixed hyperlipidemia: Secondary | ICD-10-CM

## 2022-11-01 ENCOUNTER — Other Ambulatory Visit: Payer: Self-pay | Admitting: Cardiology

## 2022-11-01 DIAGNOSIS — I10 Essential (primary) hypertension: Secondary | ICD-10-CM

## 2022-11-08 ENCOUNTER — Encounter: Payer: Self-pay | Admitting: Physical Medicine and Rehabilitation

## 2022-11-08 ENCOUNTER — Other Ambulatory Visit: Payer: Self-pay

## 2022-11-08 ENCOUNTER — Telehealth: Payer: Self-pay

## 2022-11-08 NOTE — Telephone Encounter (Signed)
Patient called in asking if you could give her a call because she needs a letter for her son stating he can only close one time out the week so that he can be home with her at night time.

## 2022-11-14 ENCOUNTER — Other Ambulatory Visit: Payer: Self-pay | Admitting: Nurse Practitioner

## 2022-11-14 ENCOUNTER — Other Ambulatory Visit: Payer: Self-pay

## 2022-11-14 DIAGNOSIS — E119 Type 2 diabetes mellitus without complications: Secondary | ICD-10-CM

## 2022-11-17 ENCOUNTER — Telehealth: Payer: Self-pay

## 2022-11-17 ENCOUNTER — Other Ambulatory Visit: Payer: Self-pay

## 2022-11-17 NOTE — Telephone Encounter (Signed)
Pt called requesting the date that she had her ablation if someone could please give her a call back.    Jill Shaw  11/17/22

## 2022-11-21 ENCOUNTER — Telehealth: Payer: Self-pay

## 2022-11-21 ENCOUNTER — Other Ambulatory Visit: Payer: Self-pay

## 2022-11-21 ENCOUNTER — Encounter
Payer: Medicare Other | Attending: Physical Medicine and Rehabilitation | Admitting: Physical Medicine and Rehabilitation

## 2022-11-21 ENCOUNTER — Ambulatory Visit (INDEPENDENT_AMBULATORY_CARE_PROVIDER_SITE_OTHER): Payer: Medicare Other

## 2022-11-21 VITALS — BP 166/85 | HR 85 | Wt 269.0 lb

## 2022-11-21 DIAGNOSIS — Z3042 Encounter for surveillance of injectable contraceptive: Secondary | ICD-10-CM | POA: Diagnosis not present

## 2022-11-21 DIAGNOSIS — Z5181 Encounter for therapeutic drug level monitoring: Secondary | ICD-10-CM | POA: Insufficient documentation

## 2022-11-21 DIAGNOSIS — Z79891 Long term (current) use of opiate analgesic: Secondary | ICD-10-CM | POA: Insufficient documentation

## 2022-11-21 DIAGNOSIS — G894 Chronic pain syndrome: Secondary | ICD-10-CM | POA: Diagnosis not present

## 2022-11-21 DIAGNOSIS — M5416 Radiculopathy, lumbar region: Secondary | ICD-10-CM | POA: Insufficient documentation

## 2022-11-21 DIAGNOSIS — M170 Bilateral primary osteoarthritis of knee: Secondary | ICD-10-CM | POA: Insufficient documentation

## 2022-11-21 DIAGNOSIS — G629 Polyneuropathy, unspecified: Secondary | ICD-10-CM | POA: Diagnosis not present

## 2022-11-21 MED ORDER — MEDROXYPROGESTERONE ACETATE 150 MG/ML IM SUSP
150.0000 mg | Freq: Once | INTRAMUSCULAR | Status: AC
  Administered 2022-11-21: 150 mg via INTRAMUSCULAR

## 2022-11-21 MED ORDER — TIZANIDINE HCL 6 MG PO CAPS
6.0000 mg | ORAL_CAPSULE | Freq: Three times a day (TID) | ORAL | 11 refills | Status: DC
  Filled 2022-11-21: qty 90, 30d supply, fill #0

## 2022-11-21 NOTE — Telephone Encounter (Signed)
Called pt; VM left stating I am returning call. Offered for patient to call back or attend appt today to discuss. Reviewed scheduled appt for this afternoon.

## 2022-11-21 NOTE — Progress Notes (Signed)
Subjective:    Patient ID: Jill Shaw, female    DOB: 1976/05/28, 47 y.o.   MRN: [id number]   HPI:   An audio/video tele-health visit is felt to be the most appropriate encounter for this patient at this time. This is a follow up tele-visit via phone. The patient is at home. MD is at office. Prior to scheduling this appointment, our staff discussed the limitations of evaluation and management by telemedicine and the availability of in-person appointments. The patient expressed understanding and agreed to proceed.    Jill Shaw is a 47 y.o. female who returns for f/u of inflammatory gastritis, chronic pain, diabetic peripheral neuropathy, and insomnia.   1) Insomnia: -She continues to experience insomnia at night. Asks about Ambien and I discussed that this is an addictive medication and there are other safer options we can try first that can also help with her pain.  -She is currently not taking either of these medications, or Amitriptyline or Trazodone.  -She takes Requip for resltless legs but this does not help. -She is still sleeping very poorly -She does use screens before bed time  2) Back pain secondary to lumbar radiculitis.  -she says she did not mean to request an early refill today, that she said this wrong, she was trying to explain to [place] -she has received 2 warnings for marijuana in her urine sample as well as for not bringing the number of correct pills to her appointments -she asks if tizanidine can be taken more than 4 times per day -She states her pain is located in her lower back radiating into her bilateral lower extremities and bilateral knee pain. She denies falling. She rates her pain 9. Her current exercise regime is walking.  -Ms. Shutes Morphine equivalent is 20.00 MME.  -She was hospitalized for serotonin syndrome after use of Savella and Cymbalta.  -Back pain has been severe -She requests a renewal of her handicap placard as she is unable to walk  200 feet without stopping to rest.  -Her pain has been better controlled with Norco -she would like to get another XR given the chronicity of her pain.  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  -she is ready to do Qutenza today. -she finds great benefits in her legs and found benefit in her hands as well but she kept forgetting and touching her face and this caused her face to [place] -she is visiting her mother who has a pneumonia and asks whether her medications can be filled early -she asks whether her oxycodone can be released early as she is going on vacation next week.  -She tolerated Qutenza well and this provided 2 weeks of good relief, benefit started right away.  -Her peripheral neuropathy is worse in her bilateral lower extremities, especially the dorsum and soles of both feet, including the toes.  -now present in her hands as well- she has been dropping objects due to her neuropathy- present on the dorsal and ventral aspects of her hands -she is interested in following with a dietician -she is interested in medicines for her diabetes -she is trying to lose weight  -needs handicap placard  4) Right shoulder and arm pain -notes limited range of motion in her shoulder -  does have pain radiating into right arm from neck and arm feels heavy at times  5) Inflammatory gastritis -has been back and forth to the hospital as this has been really hurting her. She was given pain medication in the hospital. She was given medication for emesis.  -flexeril is making her sicker.  --patient says she has recently been diagnosed with this condition and pain has been severe. -she has her gastroenterologist recommended that she discuss with [place] increasing her Percocet to better  control her pain -she is currently prescribed 3 Percocet per day but feels she would benefit from up to 5 per day  6) GERD -she can have severe reflex at times -she was started on pepcid for a short time period to help with this.   7) Morbid obesity -BMI 47.03 -weight is 274 lbs -she lose 40 lbs when she was sick since she could not tolerate food but she has gained a lot of this back -she asks about supplement she can take for weight loss  Pain Inventory Average Pain 9 Pain Right Now 9 My pain is sharp, burning, tingling and aching  In the last 24 hours, has pain interfered with the following? General activity 0 Relation with others 0 Enjoyment of life 5 What TIME of day is your pain at its worst? morning , daytime, evening and night Sleep (in general) Poor  Pain is worse with: walking, bending, sitting, inactivity, standing and some activites Pain improves with: heat/ice and medication Relief from Meds: 47  Family History  Problem Relation Age of Onset   Diabetes Mother    Hypertension Mother    Migraines Paternal Grandfather    Colon cancer Neg Hx    Esophageal cancer Neg Hx    Stomach cancer Neg Hx    Rectal cancer Neg Hx    Social History   Socioeconomic History   Marital status: Divorced    Spouse name: Not on file   Number of children: 3   Years of education: Not on file   Highest education level: Not on file  Occupational History   Occupation: unemployed  Tobacco Use   Smoking status: Some Days    Packs/day: 0.25    Years: 26.00    Total pack years: 6.50    Types: Cigarettes    Last attempt to quit: 09/13/2020    Years since quitting: 2.1   Smokeless tobacco: Never   Tobacco comments:    4-5 cigarettes/day  Vaping Use   Vaping Use: Never used  Substance and Sexual Activity   Alcohol use: No   Drug use: No   Sexual activity: Not Currently    Birth control/protection: None  Other Topics Concern   Not on file  Social History Narrative   Right  Handed   Lives in a one story apartment, but lives on the second floor   Drinks caffeine once in awhile   Social Determinants of Health   Financial Resource Strain: Not on file  Food Insecurity: Food Insecurity Present (12/08/2021)   Hunger Vital Sign    Worried About Running Out of Food in the Last Year: Never true    Ran Out of Food in the Last Year: Sometimes true  Transportation Needs: No Transportation Needs (12/08/2021)   PRAPARE - Hydrologist (Medical): No    Lack of Transportation (Non-Medical): No  Physical Activity: Not on file  Stress: Not on file  Social Connections: Not on file   Past  Surgical History:  Procedure Laterality Date   CESAREAN SECTION     x 1. for twins   DILATION AND CURETTAGE OF UTERUS N/A 08/20/2019   Procedure: DILATATION AND CURETTAGE;  Surgeon: Emily Filbert, MD;  Location: [facility];  Service: Gynecology;  Laterality: N/A;   ENDOMETRIAL ABLATION N/A 08/20/2019   Procedure: Minerva Ablation;  Surgeon: Emily Filbert, MD;  Location: [facility];  Service: Gynecology;  Laterality: N/A;   EYE SURGERY Bilateral    laser right and cataract removed left eye   LEFT HEART CATH AND CORONARY ANGIOGRAPHY N/A 08/30/2021   Procedure: LEFT HEART CATH AND CORONARY ANGIOGRAPHY;  Surgeon: Nigel Mormon, MD;  Location: [facility];  Service: Cardiovascular;  Laterality: N/A;   RADIOLOGY WITH ANESTHESIA N/A 09/16/2019   Procedure: MRI WITH ANESTHESIA   L SPINE WITHOUT CONTRAST, T SPINE WITHOUT CONTRAST , CERVICAL WITHOUT CONTRAST;  Surgeon: Radiologist, Medication, MD;  Location: [facility];  Service: Radiology;  Laterality: N/A;   TUBAL LIGATION     interval BTL   UPPER GI ENDOSCOPY  07/2017   Past Surgical History:  Procedure Laterality Date   CESAREAN SECTION     x 1. for twins   DILATION AND CURETTAGE OF UTERUS N/A 08/20/2019   Procedure: DILATATION AND CURETTAGE;  Surgeon: Emily Filbert, MD;  Location: [facility];  Service: Gynecology;   Laterality: N/A;   ENDOMETRIAL ABLATION N/A 08/20/2019   Procedure: Minerva Ablation;  Surgeon: Emily Filbert, MD;  Location: [facility];  Service: Gynecology;  Laterality: N/A;   EYE SURGERY Bilateral    laser right and cataract removed left eye   LEFT HEART CATH AND CORONARY ANGIOGRAPHY N/A 08/30/2021   Procedure: LEFT HEART CATH AND CORONARY ANGIOGRAPHY;  Surgeon: Nigel Mormon, MD;  Location: [facility];  Service: Cardiovascular;  Laterality: N/A;   RADIOLOGY WITH ANESTHESIA N/A 09/16/2019   Procedure: MRI WITH ANESTHESIA   L SPINE WITHOUT CONTRAST, T SPINE WITHOUT CONTRAST , CERVICAL WITHOUT CONTRAST;  Surgeon: Radiologist, Medication, MD;  Location: [facility];  Service: Radiology;  Laterality: N/A;   TUBAL LIGATION     interval BTL   UPPER GI ENDOSCOPY  07/2017   Past Medical History:  Diagnosis Date   Anemia    Anxiety    Arthritis    knees, hands   Asthma    Chronic diastolic (congestive) heart failure (HCC)    COPD (chronic obstructive pulmonary disease) (HCC)    Diabetes mellitus ([place])    DKA (diabetic ketoacidosis) (HCC)    Dysfunctional uterine bleeding    Gastritis    GERD (gastroesophageal reflux disease)    Hypertension    Neuromuscular disorder ([place])    neuropathy feet   Seizures ([place]) 09/12/2017   pt states r/t stress and blood sugar - no meds last one 4 months ago, not seen neurologist   Sickle cell trait ([place])    Smoker    Vitamin D deficiency 10/2019   Wears glasses    There were no vitals taken for this visit.  Opioid Risk Score:   Fall Risk Score:  `1  Depression screen [facility] 2/9     10/24/2022   10:03 AM 09/05/2022    2:45 PM 07/17/2022   11:04 AM 07/04/2022    8:58 AM 06/01/2022    9:05 AM 05/03/2022    1:52 PM 03/17/2022    2:08 PM  Depression screen PHQ 2/9  Decreased Interest 0 0 0 0 0 0 0  Down, Depressed, Hopeless 0 0 0 0 0 0 0  PHQ - 2 Score 0 0 0 0 0 0 0  Altered sleeping  2     0  Tired, decreased energy  2     0  Change in appetite   0     0  Feeling bad or failure about yourself   0     0  Trouble concentrating  0     0  Moving slowly or fidgety/restless  0     0  Suicidal thoughts  0     0  PHQ-9 Score  4     0     Review of Systems  Constitutional:  Positive for diaphoresis and unexpected weight change.  Respiratory:  Positive for shortness of breath and wheezing.   Gastrointestinal:  Positive for abdominal pain, constipation and nausea.  Musculoskeletal:  Positive for arthralgias, back pain, gait problem and myalgias.  Neurological:  Positive for dizziness, weakness and numbness.  Hematological:  Bruises/bleeds easily.  Psychiatric/Behavioral:  Positive for confusion and decreased concentration. The patient is nervous/anxious.   All other systems reviewed and are negative.      Objective:  Not performed     Assessment & Plan:  1. Lumbar Radiculitis: Continue current medication regimen. Continue HEP as Tolerated. Provided with handicap placard. XR ordered. Note for work provided via Clinical cytogeneticist.  2. Fibromyalgia: Continue HEP as Tolerated. Continue current Medication regimen. Continue to Monitor.  3. Bilateral Knee Pain: Right knee is currently worst, XR ordered and will call with results.  Continue to Monitor.  4. Chronic Pain Syndrome: Continue Percocet. Discussed that we cannot send early refills if medications are lost. Increase percocet to 6 tabs per day prn. Will send final warning letter to patient today to say that we do not permit early refills, discussed this with her pharmacy as well -provided note explaining her inability to work due to her pain 5. Insomnia: -Try to go outside near sunrise -Get exercise during the day.  -Discussed good sleep hygiene: turning off all devices an hour before bedtime.  -Chamomile tea with dinner.  -Melatonin did for help. -warm bath or shower before bed. -apply lavender oil for forehead at night  6. Diabetic peripheral neuropathy -checked with Qutenza rep and  medicaid/medicare will not cover Qutenza more frequently than every 3 months -will request 6 patches next visit to cover her hands as well -refilled tizanidine Continue percocet -Discussed Qutenza as an option for neuropathic pain control. Discussed that this is a capsaicin patch, stronger than capsaicin cream. Discussed that it is currently approved for diabetic peripheral neuropathy and post-herpetic neuralgia, but that it has also shown benefit in treating other forms of neuropathy. Provided patient with link to site to learn more about the patch: https://www.clark.biz/. Discussed that the patch would be placed in office and benefits usually last 3 months. Discussed that unintended exposure to capsaicin can cause severe irritation of eyes, mucous membranes, respiratory tract, and skin, but that Qutenza is a local treatment and does not have the systemic side effects of other nerve medications. Discussed that there may be pain, itching, erythema, and decreased sensory function associated with the application of Qutenza. Side effects usually subside within 1 week. A cold pack of analgesic medications can help with these side effects. Blood pressure can also be increased due to pain associated with administration of the patch.   7. Right sided shoulder and arm pain -discussed that could be a component or  frozen shoulder and/or cervical radiculitis -will obtain right shoulder XR and cervical spine XR and call when results are available  8. Obesity: -Educated that current weight is 274 lbs and current BMI is 47.03 -shared foods that can assist in weight loss: 1) leafy greens- high in fiber and nutrients 2) dark chocolate- improves metabolism (if prefer sweetened, best to sweeten with honey instead of sugar).  3) cruciferous vegetables- high in fiber and protein 4) full fat yogurt: high in healthy fat, protein, calcium, and probiotics 5) apples- high in a variety of phytochemicals 6) nuts- high in  fiber and protein that increase feelings of fullness 7) grapefruit: rich in nutrients, antioxidants, and fiber (not to be taken with anticoagulation) 8) beans- high in protein and fiber 9) salmon- has high quality protein and healthy fats 10) green tea- rich in polyphenols 11) eggs- rich in choline and vitamin D 12) tuna- high protein, boosts metabolism 13) avocado- decreases visceral abdominal fat 14) chicken (pasture raised): high in protein and iron 15) blueberries- reduce abdominal fat and cholesterol 16) whole grains- decreases calories retained during digestion, speeds metabolism 17) chia seeds- curb appetite 18) chilies- increases fat metabolism  -Discussed supplements that can be used:  1) Metatrim 400mg  BID 30 minutes before breakfast and dinner  2) Sphaeranthus indicus and Garcinia mangostana (combinations of these and #1 can be found in capsicum and zychrome  3) green coffee bean extract 400mg  twice per day or Irvingia (african mango) 150 to 300mg  twice per day.    9) Gastroesophageal reflux disease  -discussed the interaction between Tizanidine and Pepcid and that their additive effects could cause hypotension and bradycardia. -recommended eating small meals -recommended avoiding acidic and spicy foods -recommended apple cider vinegar in a cup of water before meals.   10) Inflammatory gastritis: -recommended following up with GI  5 minutes spent in discussing that patient says she spoke incorrectly with today and that she does not need an early refill, discussed tizanidine and Qutenza

## 2022-11-21 NOTE — Progress Notes (Signed)
Jill Shaw here for Depo-Provera Injection. Injection administered without complication. Patient will return in 3 months for next injection between 02/07/23 and 02/21/23. Next annual visit due May 2024.   BP today is elevated 166/85. Pt declines BP recheck; says she has not taken BP med since being sick the past few days so she knows BP is high. Recommended patient take her BP medication as prescribed.  Patient expresses concerns about ongoing AUB and abdominal pain. Harrel Lemon, MD recommendation of pelvic US for eval. Pt declines due to vaginal bleeding. Does not feel comfortable having [place] completed while bleeding. Explained pt needs to follow up with pain clinic for pain medication.  Annabell Howells, RN 11/21/2022  3:54 PM

## 2022-11-21 NOTE — Telephone Encounter (Signed)
No early refill on medication.  She was already educated on medication compliance, if this occurd she could be discharged from our office. She has received letters regarding the above as well.  No early refills will be given.

## 2022-11-21 NOTE — Telephone Encounter (Signed)
This provider spoke to Dr Ranell Patrick regarding Jill Shaw phone call, she states to begin the weaning process of her oxycodone.  Call placed to Jill Shaw, she reports she wasn't asking for a early refill,  I re iterated the message, she stated she was taking more medication than prescribed, we discussed self prescribing, and she needs to be compliant with medication.  Today is the 29th day, she stating she has 13 tablets of her Oxycodone.  She was asked to change her appointment till tomorrow, she states she has another appointment. The above will be discussed with Dr Ranell Patrick. Jill Shaw understanding.

## 2022-11-21 NOTE — Telephone Encounter (Signed)
Patient requesting refill on oxycodone,  she stated that she has been taking extra and has about 6 or 7 left and wants to know if she can get it filled on the 8th  Last fill per pmp was 10/24/22  Filled  Written  ID  Drug  QTY  Days  Prescriber  RX #  Dispenser  Refill  Daily Dose*  Pymt Type  PMP  10/24/2022 10/24/2022 1  Oxycodone-Acetaminophen 10-325 140.00 [address] [id number] The ([id number]) 0/0 63.64 MME Other [place]

## 2022-11-22 ENCOUNTER — Other Ambulatory Visit (HOSPITAL_BASED_OUTPATIENT_CLINIC_OR_DEPARTMENT_OTHER): Payer: Self-pay

## 2022-11-22 ENCOUNTER — Telehealth: Payer: Self-pay

## 2022-11-22 ENCOUNTER — Other Ambulatory Visit: Payer: Self-pay

## 2022-11-22 ENCOUNTER — Other Ambulatory Visit (HOSPITAL_COMMUNITY): Payer: Self-pay

## 2022-11-22 NOTE — Telephone Encounter (Signed)
Patient called and left a message wanting to discuss a letter she received. Patient was called back and there was no answer. A limited message was left asking for a return  the call. Because there was no voice identification.   Per Danella Sensing NP as discussed with Dr. Ranell Patrick patient need to make a sooner appointment. If she calls back please move her appointment up.

## 2022-11-23 ENCOUNTER — Other Ambulatory Visit: Payer: Self-pay

## 2022-11-23 ENCOUNTER — Telehealth: Payer: Self-pay

## 2022-11-23 ENCOUNTER — Other Ambulatory Visit (HOSPITAL_BASED_OUTPATIENT_CLINIC_OR_DEPARTMENT_OTHER): Payer: Self-pay

## 2022-11-23 NOTE — Telephone Encounter (Signed)
Patient called twice today to talk to Dr. Ranell Patrick about a letter she received 2 weeks ago.

## 2022-11-23 NOTE — Telephone Encounter (Signed)
Jill Shaw called in checking in on the letter that was sent to her 2 weeks ago an I also asked how many pills she had left, she advised she was not at home at the time and I advised per [name] she needed to come in as soon as possible if she is going to be out before the 13th for her appointment.

## 2022-11-24 ENCOUNTER — Other Ambulatory Visit: Payer: Self-pay

## 2022-11-24 ENCOUNTER — Other Ambulatory Visit: Payer: Self-pay | Admitting: Internal Medicine

## 2022-11-24 ENCOUNTER — Encounter (HOSPITAL_BASED_OUTPATIENT_CLINIC_OR_DEPARTMENT_OTHER): Payer: Medicare Other | Admitting: Physical Medicine and Rehabilitation

## 2022-11-24 DIAGNOSIS — G629 Polyneuropathy, unspecified: Secondary | ICD-10-CM | POA: Diagnosis not present

## 2022-11-24 MED ORDER — OXYCODONE-ACETAMINOPHEN 10-325 MG PO TABS
1.0000 | ORAL_TABLET | ORAL | 0 refills | Status: DC | PRN
  Filled 2022-11-24: qty 180, 30d supply, fill #0

## 2022-11-24 NOTE — Progress Notes (Signed)
Subjective:    Patient ID: Jill Shaw, female    DOB: Aug 20, 1976, 47 y.o.   MRN: [id number]   HPI:   An audio/video tele-health visit is felt to be the most appropriate encounter for this patient at this time. This is a follow up tele-visit via phone. The patient is at home. MD is at office. Prior to scheduling this appointment, our staff discussed the limitations of evaluation and management by telemedicine and the availability of in-person appointments. The patient expressed understanding and agreed to proceed.    Jill Shaw is a 47 y.o. female who returns for f/u of inflammatory gastritis, chronic pain, diabetic peripheral neuropathy, and insomnia.   1) Insomnia: -She continues to experience insomnia at night. Asks about Ambien and I discussed that this is an addictive medication and there are other safer options we can try first that can also help with her pain.  -She is currently not taking either of these medications, or Amitriptyline or Trazodone.  -She takes Requip for resltless legs but this does not help. -She is still sleeping very poorly -She does use screens before bed time  2) Back pain secondary to lumbar radiculitis.  -she says she did not mean to request an early refill today, that she said this wrong, she was trying to explain to [place] -she has received 2 warnings for marijuana in her urine sample as well as for not bringing the number of correct pills to her appointments -she asks if tizanidine can be taken more than 4 times per day -She states her pain is located in her lower back radiating into her bilateral lower extremities and bilateral knee pain. She denies falling. She rates her pain 9. Her current exercise regime is walking.  -Ms. Hunke Morphine equivalent is 20.00 MME.  -She was hospitalized for serotonin syndrome after use of Savella and Cymbalta.  -Back pain has been severe -She requests a renewal of her handicap placard as she is unable to walk  200 feet without stopping to rest.  -Her pain has been better controlled with Norco -she would like to get another XR given the chronicity of her pain.  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 -she asks whether Qutenza can be administered more frequently than every 3 months, it has been helping a lot.  -she is ready to do Qutenza today. -she finds great benefits in her legs and found benefit in her hands as well but she kept forgetting and touching her face and this caused her face to [place] -she is visiting her mother who has a pneumonia and asks whether her medications can be filled early -she asks whether her oxycodone can be released early as she is going on vacation next week.  -She tolerated Qutenza well and this provided 2 weeks of good relief, benefit started right away.  -Her peripheral neuropathy is worse in her bilateral lower extremities, especially the dorsum and soles of both feet, including the toes.  -now present in her hands as well- she has been dropping objects due to her neuropathy- present on the dorsal and ventral aspects of her hands -she is interested in following with a dietician -she is interested in  medicines for her diabetes -she is trying to lose weight  -needs handicap placard  4) Right shoulder and arm pain -notes limited range of motion in her shoulder -does have pain radiating into right arm from neck and arm feels heavy at times  5) Inflammatory gastritis -has been back and forth to the hospital as this has been really hurting her. She was given pain medication in the hospital. She was given medication for emesis.  -flexeril is making her sicker.  --patient says she has recently been  diagnosed with this condition and pain has been severe. -she has her gastroenterologist recommended that she discuss with [place] increasing her Percocet to better control her pain -she is currently prescribed 3 Percocet per day but feels she would benefit from up to 5 per day  6) GERD -she can have severe reflex at times -she was started on pepcid for a short time period to help with this.   7) Morbid obesity -BMI 47.03 -weight is 274 lbs -she lose 40 lbs when she was sick since she could not tolerate food but she has gained a lot of this back -she asks about supplement she can take for weight loss  Pain Inventory Average Pain 9 Pain Right Now 9 My pain is sharp, burning, tingling and aching  In the last 24 hours, has pain interfered with the following? General activity 0 Relation with others 0 Enjoyment of life 5 What TIME of day is your pain at its worst? morning , daytime, evening and night Sleep (in general) Poor  Pain is worse with: walking, bending, sitting, inactivity, standing and some activites Pain improves with: heat/ice and medication Relief from Meds: 53  Family History  Problem Relation Age of Onset   Diabetes Mother    Hypertension Mother    Migraines Paternal Grandfather    Colon cancer Neg Hx    Esophageal cancer Neg Hx    Stomach cancer Neg Hx    Rectal cancer Neg Hx    Social History   Socioeconomic History   Marital status: Divorced    Spouse name: Not on file   Number of children: 3   Years of education: Not on file   Highest education level: Not on file  Occupational History   Occupation: unemployed  Tobacco Use   Smoking status: Some Days    Packs/day: 0.25    Years: 26.00    Total pack years: 6.50    Types: Cigarettes    Last attempt to quit: 09/13/2020    Years since quitting: 2.1   Smokeless tobacco: Never   Tobacco comments:    4-5 cigarettes/day  Vaping Use   Vaping Use: Never used  Substance and Sexual Activity   Alcohol use: No    Drug use: No   Sexual activity: Not Currently    Birth control/protection: None  Other Topics Concern   Not on file  Social History Narrative   Right Handed   Lives in a one story apartment, but lives on the second floor   Drinks caffeine once in awhile   Social Determinants of Health   Financial Resource Strain: Not on file  Food Insecurity: Food Insecurity Present (12/08/2021)   Hunger Vital Sign    Worried About Running Out of Food in the Last Year: Never true    Ran Out of Food in the Last Year: Sometimes true  Transportation Needs: No Transportation Needs (12/08/2021)   PRAPARE - Hydrologist (Medical):  No    Lack of Transportation (Non-Medical): No  Physical Activity: Not on file  Stress: Not on file  Social Connections: Not on file   Past Surgical History:  Procedure Laterality Date   CESAREAN SECTION     x 1. for twins   DILATION AND CURETTAGE OF UTERUS N/A 08/20/2019   Procedure: DILATATION AND CURETTAGE;  Surgeon: Emily Filbert, MD;  Location: [place];  Service: Gynecology;  Laterality: N/A;   ENDOMETRIAL ABLATION N/A 08/20/2019   Procedure: Minerva Ablation;  Surgeon: Emily Filbert, MD;  Location: [place];  Service: Gynecology;  Laterality: N/A;   EYE SURGERY Bilateral    laser right and cataract removed left eye   LEFT HEART CATH AND CORONARY ANGIOGRAPHY N/A 08/30/2021   Procedure: LEFT HEART CATH AND CORONARY ANGIOGRAPHY;  Surgeon: Nigel Mormon, MD;  Location: [place] [facility];  Service: Cardiovascular;  Laterality: N/A;   RADIOLOGY WITH ANESTHESIA N/A 09/16/2019   Procedure: MRI WITH ANESTHESIA   L SPINE WITHOUT CONTRAST, T SPINE WITHOUT CONTRAST , CERVICAL WITHOUT CONTRAST;  Surgeon: Radiologist, Medication, MD;  Location: [place];  Service: Radiology;  Laterality: N/A;   TUBAL LIGATION     interval BTL   UPPER GI ENDOSCOPY  07/2017   Past Surgical History:  Procedure Laterality Date   CESAREAN SECTION     x 1. for twins    DILATION AND CURETTAGE OF UTERUS N/A 08/20/2019   Procedure: DILATATION AND CURETTAGE;  Surgeon: Emily Filbert, MD;  Location: [facility];  Service: Gynecology;  Laterality: N/A;   ENDOMETRIAL ABLATION N/A 08/20/2019   Procedure: Minerva Ablation;  Surgeon: Emily Filbert, MD;  Location: [place];  Service: Gynecology;  Laterality: N/A;   EYE SURGERY Bilateral    laser right and cataract removed left eye   LEFT HEART CATH AND CORONARY ANGIOGRAPHY N/A 08/30/2021   Procedure: LEFT HEART CATH AND CORONARY ANGIOGRAPHY;  Surgeon: Nigel Mormon, MD;  Location: [place] [facility];  Service: Cardiovascular;  Laterality: N/A;   RADIOLOGY WITH ANESTHESIA N/A 09/16/2019   Procedure: MRI WITH ANESTHESIA   L SPINE WITHOUT CONTRAST, T SPINE WITHOUT CONTRAST , CERVICAL WITHOUT CONTRAST;  Surgeon: Radiologist, Medication, MD;  Location: [place];  Service: Radiology;  Laterality: N/A;   TUBAL LIGATION     interval BTL   UPPER GI ENDOSCOPY  07/2017   Past Medical History:  Diagnosis Date   Anemia    Anxiety    Arthritis    knees, hands   Asthma    Chronic diastolic (congestive) heart failure (HCC)    COPD (chronic obstructive pulmonary disease) (HCC)    Diabetes mellitus ([place])    DKA (diabetic ketoacidosis) (HCC)    Dysfunctional uterine bleeding    Gastritis    GERD (gastroesophageal reflux disease)    Hypertension    Neuromuscular disorder ([place])    neuropathy feet   Seizures ([place]) 09/12/2017   pt states r/t stress and blood sugar - no meds last one 4 months ago, not seen neurologist   Sickle cell trait ([place])    Smoker    Vitamin D deficiency 10/2019   Wears glasses    There were no vitals taken for this visit.  Opioid Risk Score:   Fall Risk Score:  `1  Depression screen [facility] 2/9     10/24/2022   10:03 AM 09/05/2022    2:45 PM 07/17/2022   11:04 AM 07/04/2022    8:58 AM 06/01/2022  9:05 AM 05/03/2022    1:52 PM 03/17/2022    2:08 PM  Depression screen PHQ 2/9  Decreased Interest 0 0 0 0 0 0 0   Down, Depressed, Hopeless 0 0 0 0 0 0 0  PHQ - 2 Score 0 0 0 0 0 0 0  Altered sleeping  2     0  Tired, decreased energy  2     0  Change in appetite  0     0  Feeling bad or failure about yourself   0     0  Trouble concentrating  0     0  Moving slowly or fidgety/restless  0     0  Suicidal thoughts  0     0  PHQ-9 Score  4     0     Review of Systems  Constitutional:  Positive for diaphoresis and unexpected weight change.  Respiratory:  Positive for shortness of breath and wheezing.   Gastrointestinal:  Positive for abdominal pain, constipation and nausea.  Musculoskeletal:  Positive for arthralgias, back pain, gait problem and myalgias.  Neurological:  Positive for dizziness, weakness and numbness.  Hematological:  Bruises/bleeds easily.  Psychiatric/Behavioral:  Positive for confusion and decreased concentration. The patient is nervous/anxious.   All other systems reviewed and are negative.      Objective:  Not performed     Assessment & Plan:  1. Lumbar Radiculitis: Continue current medication regimen. Continue HEP as Tolerated. Provided with handicap placard. XR ordered. Note for work provided via Pharmacist, community.  2. Fibromyalgia: Continue HEP as Tolerated. Continue current Medication regimen. Continue to Monitor.  3. Bilateral Knee Pain: Right knee is currently worst, XR ordered and will call with results.  Continue to Monitor.  4. Chronic Pain Syndrome: Continue Percocet. Discussed that we cannot send early refills if medications are lost. Increase percocet to 6 tabs per day prn. Will send final warning letter to patient today to say that we do not permit early refills, discussed this with her pharmacy as well -provided note explaining her inability to work due to her pain 5. Insomnia: -Try to go outside near sunrise -Get exercise during the day.  -Discussed good sleep hygiene: turning off all devices an hour before bedtime.  -Chamomile tea with dinner.  -Melatonin did for  help. -warm bath or shower before bed. -apply lavender oil for forehead at night  6. Diabetic peripheral neuropathy -provided note for son to give to work to indicate that patient needs his assistance 24/7 due to her mobility deficits -checked with Qutenza rep and medicaid/medicare will not cover Qutenza more frequently than every 3 months -will request 6 patches next visit to cover her hands as well -refilled tizanidine Continue percocet -Discussed Qutenza as an option for neuropathic pain control. Discussed that this is a capsaicin patch, stronger than capsaicin cream. Discussed that it is currently approved for diabetic peripheral neuropathy and post-herpetic neuralgia, but that it has also shown benefit in treating other forms of neuropathy. Provided patient with link to site to learn more about the patch: [employer]. Discussed that the patch would be placed in office and benefits usually last 3 months. Discussed that unintended exposure to capsaicin can cause severe irritation of eyes, mucous membranes, respiratory tract, and skin, but that Qutenza is a local treatment and does not have the systemic side effects of other nerve medications. Discussed that there may be pain, itching, erythema, and decreased sensory function associated with the application of  Qutenza. Side effects usually subside within 1 week. A cold pack of analgesic medications can help with these side effects. Blood pressure can also be increased due to pain associated with administration of the patch.   7. Right sided shoulder and arm pain -discussed that could be a component or frozen shoulder and/or cervical radiculitis -will obtain right shoulder XR and cervical spine XR and call when results are available  8. Obesity: -Educated that current weight is 274 lbs and current BMI is 47.03 -shared foods that can assist in weight loss: 1) leafy greens- high in fiber and nutrients 2) dark chocolate- improves  metabolism (if prefer sweetened, best to sweeten with honey instead of sugar).  3) cruciferous vegetables- high in fiber and protein 4) full fat yogurt: high in healthy fat, protein, calcium, and probiotics 5) apples- high in a variety of phytochemicals 6) nuts- high in fiber and protein that increase feelings of fullness 7) grapefruit: rich in nutrients, antioxidants, and fiber (not to be taken with anticoagulation) 8) beans- high in protein and fiber 9) salmon- has high quality protein and healthy fats 10) green tea- rich in polyphenols 11) eggs- rich in choline and vitamin D 12) tuna- high protein, boosts metabolism 13) avocado- decreases visceral abdominal fat 14) chicken (pasture raised): high in protein and iron 15) blueberries- reduce abdominal fat and cholesterol 16) whole grains- decreases calories retained during digestion, speeds metabolism 17) chia seeds- curb appetite 18) chilies- increases fat metabolism  -Discussed supplements that can be used:  1) Metatrim 419m BID 30 minutes before breakfast and dinner  2) Sphaeranthus indicus and Garcinia mangostana (combinations of these and #1 can be found in capsicum and zychrome  3) green coffee bean extract [id number] per day or Irvingia (african mango) 150 to 30055mwice per day.    9) Gastroesophageal reflux disease  -discussed the interaction between Tizanidine and Pepcid and that their additive effects could cause hypotension and bradycardia. -recommended eating small meals -recommended avoiding acidic and spicy foods -recommended apple cider vinegar in a cup of water before meals.   10) Inflammatory gastritis: -recommended following up with GI  5 minutes spent in discussion of her mobility deficits and that I would provide a note for her her son that his mother he needs 24/7 assistance at home

## 2022-11-27 ENCOUNTER — Encounter (HOSPITAL_BASED_OUTPATIENT_CLINIC_OR_DEPARTMENT_OTHER): Payer: Medicare Other | Admitting: Registered Nurse

## 2022-11-27 ENCOUNTER — Encounter: Payer: Self-pay | Admitting: Registered Nurse

## 2022-11-27 ENCOUNTER — Other Ambulatory Visit: Payer: Self-pay

## 2022-11-27 VITALS — BP 117/74 | HR 77 | Ht 64.0 in | Wt 269.0 lb

## 2022-11-27 DIAGNOSIS — M170 Bilateral primary osteoarthritis of knee: Secondary | ICD-10-CM

## 2022-11-27 DIAGNOSIS — Z79891 Long term (current) use of opiate analgesic: Secondary | ICD-10-CM | POA: Diagnosis not present

## 2022-11-27 DIAGNOSIS — G629 Polyneuropathy, unspecified: Secondary | ICD-10-CM

## 2022-11-27 DIAGNOSIS — Z5181 Encounter for therapeutic drug level monitoring: Secondary | ICD-10-CM

## 2022-11-27 DIAGNOSIS — G894 Chronic pain syndrome: Secondary | ICD-10-CM

## 2022-11-27 DIAGNOSIS — M5416 Radiculopathy, lumbar region: Secondary | ICD-10-CM | POA: Diagnosis not present

## 2022-11-27 MED ORDER — OXYCODONE-ACETAMINOPHEN 10-325 MG PO TABS
1.0000 | ORAL_TABLET | ORAL | 0 refills | Status: DC | PRN
  Filled 2022-11-27: qty 180, 30d supply, fill #0

## 2022-11-27 NOTE — Progress Notes (Signed)
Subjective:    Patient ID: Jill Shaw, female    DOB: 1976-08-20, 47 y.o.   MRN: [id number]  HPI: Jill Shaw is a 47 y.o. female who returns for follow up appointment for chronic pain and medication refill. She states her pain is located in her lower back radiating into her bilateral lower extremities and bilateral knee pain R>L. She rates her pain 9. Her current exercise regime is walking and performing stretching exercises performing stretching exercises.  Ms. Roder Morphine equivalent is 90.00 MME.  Ms. Aleo had her Oxycodone filled on 11/24/2022, Oxycodone prescription was removed from profile, spoke with pharmacist.    Pain Inventory Average Pain 9 Pain Right Now 9 My pain is sharp, burning, dull, stabbing, tingling, and aching  In the last 24 hours, has pain interfered with the following? General activity 1 Relation with others 1 Enjoyment of life 1 What TIME of day is your pain at its worst? morning , daytime, evening, and night Sleep (in general) Poor  Pain is worse with: walking, bending, sitting, inactivity, standing, unsure, and some activites Pain improves with: heat/ice, medication Relief from Meds: 10  Family History  Problem Relation Age of Onset   Diabetes Mother    Hypertension Mother    Migraines Paternal Grandfather    Colon cancer Neg Hx    Esophageal cancer Neg Hx    Stomach cancer Neg Hx    Rectal cancer Neg Hx    Social History   Socioeconomic History   Marital status: Divorced    Spouse name: Not on file   Number of children: 3   Years of education: Not on file   Highest education level: Not on file  Occupational History   Occupation: unemployed  Tobacco Use   Smoking status: Some Days    Packs/day: 0.25    Years: 26.00    Total pack years: 6.50    Types: Cigarettes    Last attempt to quit: 09/13/2020    Years since quitting: 2.2   Smokeless tobacco: Never   Tobacco comments:    4-5 cigarettes/day  Vaping Use   Vaping  Use: Never used  Substance and Sexual Activity   Alcohol use: No   Drug use: No   Sexual activity: Not Currently    Birth control/protection: None  Other Topics Concern   Not on file  Social History Narrative   Right Handed   Lives in a one story apartment, but lives on the second floor   Drinks caffeine once in awhile   Social Determinants of Health   Financial Resource Strain: Not on file  Food Insecurity: Food Insecurity Present (12/08/2021)   Hunger Vital Sign    Worried About Running Out of Food in the Last Year: Never true    Ran Out of Food in the Last Year: Sometimes true  Transportation Needs: No Transportation Needs (12/08/2021)   PRAPARE - Hydrologist (Medical): No    Lack of Transportation (Non-Medical): No  Physical Activity: Not on file  Stress: Not on file  Social Connections: Not on file   Past Surgical History:  Procedure Laterality Date   CESAREAN SECTION     x 1. for twins   DILATION AND CURETTAGE OF UTERUS N/A 08/20/2019   Procedure: DILATATION AND CURETTAGE;  Surgeon: Emily Filbert, MD;  Location: [place];  Service: Gynecology;  Laterality: N/A;   ENDOMETRIAL ABLATION N/A 08/20/2019   Procedure: Minerva Ablation;  Surgeon: Hulan Fray,  Wilhemina Cash, MD;  Location: [place];  Service: Gynecology;  Laterality: N/A;   EYE SURGERY Bilateral    laser right and cataract removed left eye   LEFT HEART CATH AND CORONARY ANGIOGRAPHY N/A 08/30/2021   Procedure: LEFT HEART CATH AND CORONARY ANGIOGRAPHY;  Surgeon: Nigel Mormon, MD;  Location: [place] [facility];  Service: Cardiovascular;  Laterality: N/A;   RADIOLOGY WITH ANESTHESIA N/A 09/16/2019   Procedure: MRI WITH ANESTHESIA   L SPINE WITHOUT CONTRAST, T SPINE WITHOUT CONTRAST , CERVICAL WITHOUT CONTRAST;  Surgeon: Radiologist, Medication, MD;  Location: [place];  Service: Radiology;  Laterality: N/A;   TUBAL LIGATION     interval BTL   UPPER GI ENDOSCOPY  07/2017   Past Surgical History:   Procedure Laterality Date   CESAREAN SECTION     x 1. for twins   DILATION AND CURETTAGE OF UTERUS N/A 08/20/2019   Procedure: DILATATION AND CURETTAGE;  Surgeon: Emily Filbert, MD;  Location: [place];  Service: Gynecology;  Laterality: N/A;   ENDOMETRIAL ABLATION N/A 08/20/2019   Procedure: Minerva Ablation;  Surgeon: Emily Filbert, MD;  Location: [place];  Service: Gynecology;  Laterality: N/A;   EYE SURGERY Bilateral    laser right and cataract removed left eye   LEFT HEART CATH AND CORONARY ANGIOGRAPHY N/A 08/30/2021   Procedure: LEFT HEART CATH AND CORONARY ANGIOGRAPHY;  Surgeon: Nigel Mormon, MD;  Location: [place] [facility];  Service: Cardiovascular;  Laterality: N/A;   RADIOLOGY WITH ANESTHESIA N/A 09/16/2019   Procedure: MRI WITH ANESTHESIA   L SPINE WITHOUT CONTRAST, T SPINE WITHOUT CONTRAST , CERVICAL WITHOUT CONTRAST;  Surgeon: Radiologist, Medication, MD;  Location: [place];  Service: Radiology;  Laterality: N/A;   TUBAL LIGATION     interval BTL   UPPER GI ENDOSCOPY  07/2017   Past Medical History:  Diagnosis Date   Anemia    Anxiety    Arthritis    knees, hands   Asthma    Chronic diastolic (congestive) heart failure (HCC)    COPD (chronic obstructive pulmonary disease) (HCC)    Diabetes mellitus ([place])    DKA (diabetic ketoacidosis) (HCC)    Dysfunctional uterine bleeding    Gastritis    GERD (gastroesophageal reflux disease)    Hypertension    Neuromuscular disorder ([place])    neuropathy feet   Seizures ([place]) 09/12/2017   pt states r/t stress and blood sugar - no meds last one 4 months ago, not seen neurologist   Sickle cell trait ([place])    Smoker    Vitamin D deficiency 10/2019   Wears glasses    There were no vitals taken for this visit.  Opioid Risk Score:   Fall Risk Score:  `1  Depression screen [facility] 2/9     10/24/2022   10:03 AM 09/05/2022    2:45 PM 07/17/2022   11:04 AM 07/04/2022    8:58 AM 06/01/2022    9:05 AM 05/03/2022    1:52 PM 03/17/2022     2:08 PM  Depression screen PHQ 2/9  Decreased Interest 0 0 0 0 0 0 0  Down, Depressed, Hopeless 0 0 0 0 0 0 0  PHQ - 2 Score 0 0 0 0 0 0 0  Altered sleeping  2     0  Tired, decreased energy  2     0  Change in appetite  0     0  Feeling bad or failure about yourself  0     0  Trouble concentrating  0     0  Moving slowly or fidgety/restless  0     0  Suicidal thoughts  0     0  PHQ-9 Score  4     0    Review of Systems  Constitutional: Negative.   HENT: Negative.    Eyes: Negative.   Respiratory: Negative.    Cardiovascular: Negative.   Gastrointestinal: Negative.   Endocrine: Negative.   Genitourinary: Negative.   Musculoskeletal:  Positive for arthralgias, back pain and myalgias.  Skin: Negative.   Allergic/Immunologic: Negative.   Neurological: Negative.   Psychiatric/Behavioral: Negative.        Objective:   Physical Exam Vitals and nursing note reviewed.  Constitutional:      Appearance: Normal appearance.  Cardiovascular:     Rate and Rhythm: Normal rate and regular rhythm.     Pulses: Normal pulses.     Heart sounds: Normal heart sounds.  Pulmonary:     Effort: Pulmonary effort is normal.     Breath sounds: Normal breath sounds.  Musculoskeletal:     Cervical back: Normal range of motion and neck supple.     Comments: Normal Muscle Bulk and Muscle Testing Reveals:  Upper Extremities: Full ROM and Muscle Strength 5/5  Lumbar Paraspinal Tenderness: L-3-L-5 Lower Extremities: Full ROM and Muscle Strength 5/5 Arises from Chair with ease Narrow Based Gait     Skin:    General: Skin is warm and dry.  Neurological:     Mental Status: She is alert and oriented to person, place, and time.  Psychiatric:        Mood and Affect: Mood normal.        Behavior: Behavior normal.         Assessment & Plan:  1. Lumbar Radiculitis: Continue current medication regimen. Continue HEP as Tolerated.11/27/2022 2. Fibromyalgia/Polyneuropathy: Continue HEP as Tolerated.  Continue current Medication regimen. Continue to Monitor. 11/27/2022 3. Bilateral Knee Pain: R>L. Continue HEP as tolerated. Continue to Monitor.11/27/2022 4. Chronic Pain Syndrome: Continue Oxycodone 10/384m one tablet 6 times  a day as needed for pain #180. Pharmacy was called: to remove Oxycodone prescription sent on 11/27/2022. According to PMP Oxycodone was filled on 11/24/2022. Pharmacist verbalized understanding. We will continue the opioid monitoring program, this consists of regular clinic visits, examinations, urine drug screen, pill counts [place] A 12 month History has been reviewed on the [place] 12/142023.  5.  Muscle Spasm: Continue Tizanidine: Continue to Monitor . 11/27/2022   F/U in 1 mo

## 2022-11-28 ENCOUNTER — Encounter: Payer: Medicare Other | Admitting: Registered Nurse

## 2022-11-30 ENCOUNTER — Other Ambulatory Visit (HOSPITAL_COMMUNITY): Payer: Self-pay

## 2022-11-30 ENCOUNTER — Telehealth: Payer: Self-pay | Admitting: Pharmacy Technician

## 2022-11-30 NOTE — Telephone Encounter (Signed)
Patient Advocate Encounter  Received notification from [facility] that prior authorization for DEXLANSOPRAZOLE 30MG is required.   PA submitted on 2.15.24 Key [id number] Status is pending

## 2022-12-04 NOTE — Telephone Encounter (Signed)
Caller & Relationship to patient:  MRN #  [id number]   Call Back Number:   Date of Last Office Visit: 11/24/2022     Date of Next Office Visit: Visit date not found    Medication(s) to be Refilled: Acid reflux med ..she/her/hers completely out n needs asap  Preferred Pharmacy:   ** Please notify patient to allow 48-72 hours to process** **Let patient know to contact pharmacy at the end of the day to make sure medication is ready. ** **If patient has not been seen in a year or longer, book an appointment **Advise to use MyChart for refill requests OR to contact their pharmacy

## 2022-12-05 ENCOUNTER — Other Ambulatory Visit: Payer: Self-pay

## 2022-12-05 ENCOUNTER — Other Ambulatory Visit (HOSPITAL_COMMUNITY): Payer: Self-pay

## 2022-12-05 MED ORDER — FAMOTIDINE 40 MG PO TABS
40.0000 mg | ORAL_TABLET | Freq: Every evening | ORAL | 1 refills | Status: DC
  Filled 2022-12-05: qty 30, 30d supply, fill #0

## 2022-12-05 NOTE — Telephone Encounter (Signed)
.  Caller & Relationship to patient:  MRN #  [id number]   Call Back Number:   Date of Last Office Visit: 11/08/2022     Date of Next Office Visit: 11/21/2022    Medication(s) to be Refilled: Zolfran  Preferred Pharmacy:   ** Please notify patient to allow 48-72 hours to process** **Let patient know to contact pharmacy at the end of the day to make sure medication is ready. ** **If patient has not been seen in a year or longer, book an appointment **Advise to use MyChart for refill requests OR to contact their pharmacy

## 2022-12-05 NOTE — Telephone Encounter (Signed)
Patient Advocate Encounter  Prior Authorization for DEXLANSOPRAZOLE 30MG has been approved.    PA# [id number] Effective dates: 1.1.24 through 2.16.25

## 2022-12-05 NOTE — Telephone Encounter (Signed)
Done KH 

## 2022-12-06 ENCOUNTER — Telehealth: Payer: Self-pay | Admitting: Nurse Practitioner

## 2022-12-06 ENCOUNTER — Other Ambulatory Visit: Payer: Self-pay | Admitting: Nurse Practitioner

## 2022-12-06 ENCOUNTER — Other Ambulatory Visit: Payer: Self-pay

## 2022-12-06 MED ORDER — ONDANSETRON 4 MG PO TBDP
4.0000 mg | ORAL_TABLET | Freq: Three times a day (TID) | ORAL | 0 refills | Status: DC | PRN
  Filled 2022-12-06 – 2022-12-20 (×2): qty 12, 4d supply, fill #0

## 2022-12-06 NOTE — Telephone Encounter (Signed)
Patient LVM on nurse line 12/05/22 requesting prescription for nausea & vomiting to be sent to [facility] on [address].If questions, call her mom at [phone]

## 2022-12-07 ENCOUNTER — Other Ambulatory Visit: Payer: Self-pay

## 2022-12-07 ENCOUNTER — Other Ambulatory Visit (HOSPITAL_COMMUNITY): Payer: Self-pay

## 2022-12-07 ENCOUNTER — Telehealth: Payer: Self-pay | Admitting: *Deleted

## 2022-12-07 NOTE — Telephone Encounter (Signed)
Patient called and said she knocked her tizanidine down the sink. It's too early to fill where insurance will pay. Patient called pharmacy to refill and pharmacy calling to see if you will give an override. Please advise.

## 2022-12-08 NOTE — Telephone Encounter (Signed)
Notified MD will not grant over-ride Nothing further needed.

## 2022-12-12 ENCOUNTER — Other Ambulatory Visit: Payer: Self-pay

## 2022-12-13 ENCOUNTER — Other Ambulatory Visit: Payer: Self-pay

## 2022-12-14 ENCOUNTER — Telehealth: Payer: Self-pay | Admitting: Nurse Practitioner

## 2022-12-14 NOTE — Telephone Encounter (Signed)
Contacted TANAIJAH KINZLER to schedule their annual wellness visit. Appointment made for 12/21/2022.  Thank you,  [place] Direct dial  [phone]

## 2022-12-15 ENCOUNTER — Other Ambulatory Visit: Payer: Self-pay

## 2022-12-15 MED ORDER — DEXLANSOPRAZOLE 30 MG PO CPDR
30.0000 mg | DELAYED_RELEASE_CAPSULE | Freq: Every day | ORAL | 0 refills | Status: DC
  Filled 2022-12-15: qty 90, 90d supply, fill #0

## 2022-12-18 ENCOUNTER — Emergency Department (HOSPITAL_BASED_OUTPATIENT_CLINIC_OR_DEPARTMENT_OTHER): Payer: Medicare Other

## 2022-12-18 ENCOUNTER — Other Ambulatory Visit: Payer: Self-pay

## 2022-12-18 ENCOUNTER — Emergency Department (HOSPITAL_BASED_OUTPATIENT_CLINIC_OR_DEPARTMENT_OTHER)
Admission: EM | Admit: 2022-12-18 | Discharge: 2022-12-18 | Disposition: A | Discharge status: Home / Self Care | Payer: Medicare Other | Attending: Emergency Medicine | Admitting: Emergency Medicine

## 2022-12-18 ENCOUNTER — Emergency Department (HOSPITAL_BASED_OUTPATIENT_CLINIC_OR_DEPARTMENT_OTHER): Payer: Medicare Other | Admitting: Radiology

## 2022-12-18 ENCOUNTER — Encounter (HOSPITAL_BASED_OUTPATIENT_CLINIC_OR_DEPARTMENT_OTHER): Payer: Self-pay | Admitting: Emergency Medicine

## 2022-12-18 DIAGNOSIS — J449 Chronic obstructive pulmonary disease, unspecified: Secondary | ICD-10-CM | POA: Diagnosis not present

## 2022-12-18 DIAGNOSIS — B309 Viral conjunctivitis, unspecified: Secondary | ICD-10-CM | POA: Insufficient documentation

## 2022-12-18 DIAGNOSIS — E11649 Type 2 diabetes mellitus with hypoglycemia without coma: Secondary | ICD-10-CM | POA: Diagnosis not present

## 2022-12-18 DIAGNOSIS — G8911 Acute pain due to trauma: Secondary | ICD-10-CM | POA: Diagnosis not present

## 2022-12-18 DIAGNOSIS — Z20822 Contact with and (suspected) exposure to covid-19: Secondary | ICD-10-CM | POA: Diagnosis not present

## 2022-12-18 DIAGNOSIS — I110 Hypertensive heart disease with heart failure: Secondary | ICD-10-CM | POA: Insufficient documentation

## 2022-12-18 DIAGNOSIS — Z7984 Long term (current) use of oral hypoglycemic drugs: Secondary | ICD-10-CM | POA: Diagnosis not present

## 2022-12-18 DIAGNOSIS — Z79899 Other long term (current) drug therapy: Secondary | ICD-10-CM | POA: Diagnosis not present

## 2022-12-18 DIAGNOSIS — E162 Hypoglycemia, unspecified: Secondary | ICD-10-CM

## 2022-12-18 DIAGNOSIS — R079 Chest pain, unspecified: Secondary | ICD-10-CM | POA: Diagnosis not present

## 2022-12-18 DIAGNOSIS — R0789 Other chest pain: Secondary | ICD-10-CM | POA: Diagnosis present

## 2022-12-18 DIAGNOSIS — E1140 Type 2 diabetes mellitus with diabetic neuropathy, unspecified: Secondary | ICD-10-CM | POA: Insufficient documentation

## 2022-12-18 DIAGNOSIS — M545 Low back pain, unspecified: Secondary | ICD-10-CM | POA: Diagnosis not present

## 2022-12-18 DIAGNOSIS — J811 Chronic pulmonary edema: Secondary | ICD-10-CM | POA: Diagnosis not present

## 2022-12-18 DIAGNOSIS — J069 Acute upper respiratory infection, unspecified: Secondary | ICD-10-CM | POA: Diagnosis not present

## 2022-12-18 DIAGNOSIS — Z794 Long term (current) use of insulin: Secondary | ICD-10-CM | POA: Diagnosis not present

## 2022-12-18 DIAGNOSIS — Z7951 Long term (current) use of inhaled steroids: Secondary | ICD-10-CM | POA: Diagnosis not present

## 2022-12-18 DIAGNOSIS — W01198A Fall on same level from slipping, tripping and stumbling with subsequent striking against other object, initial encounter: Secondary | ICD-10-CM | POA: Diagnosis not present

## 2022-12-18 DIAGNOSIS — M549 Dorsalgia, unspecified: Secondary | ICD-10-CM | POA: Insufficient documentation

## 2022-12-18 DIAGNOSIS — J45909 Unspecified asthma, uncomplicated: Secondary | ICD-10-CM | POA: Diagnosis not present

## 2022-12-18 DIAGNOSIS — I5032 Chronic diastolic (congestive) heart failure: Secondary | ICD-10-CM | POA: Insufficient documentation

## 2022-12-18 LAB — BASIC METABOLIC PANEL
Anion gap: 9 (ref 5–15)
BUN: 6 mg/dL (ref 6–20)
CO2: 24 mmol/L (ref 22–32)
Calcium: 9.5 mg/dL (ref 8.9–10.3)
Chloride: 110 mmol/L (ref 98–111)
Creatinine, Ser: 1.19 mg/dL — ABNORMAL HIGH (ref 0.44–1.00)
GFR, Estimated: 57 mL/min — ABNORMAL LOW (ref >60)
Glucose, Bld: 34 mg/dL — CL (ref 70–99)
Potassium: 3.3 mmol/L — ABNORMAL LOW (ref 3.5–5.1)
Sodium: 143 mmol/L (ref 135–145)

## 2022-12-18 LAB — RESP PANEL BY RT-PCR (RSV, FLU A&B, COVID)  RVPGX2
Influenza A by PCR: NEGATIVE
Influenza B by PCR: NEGATIVE
Resp Syncytial Virus by PCR: NEGATIVE
SARS Coronavirus 2 by RT PCR: NEGATIVE

## 2022-12-18 LAB — CBC WITH DIFFERENTIAL/PLATELET
Abs Immature Granulocytes: 0.08 10*3/uL — ABNORMAL HIGH (ref 0.00–0.07)
Basophils Absolute: 0.1 10*3/uL (ref 0.0–0.1)
Basophils Relative: 1 %
Eosinophils Absolute: 0.3 10*3/uL (ref 0.0–0.5)
Eosinophils Relative: 2 %
HCT: 39 % (ref 36.0–46.0)
Hemoglobin: 13 g/dL (ref 12.0–15.0)
Immature Granulocytes: 1 %
Lymphocytes Relative: 27 %
Lymphs Abs: 3.2 10*3/uL (ref 0.7–4.0)
MCH: 27 pg (ref 26.0–34.0)
MCHC: 33.3 g/dL (ref 30.0–36.0)
MCV: 80.9 fL (ref 80.0–100.0)
Monocytes Absolute: 0.9 10*3/uL (ref 0.1–1.0)
Monocytes Relative: 8 %
Neutro Abs: 7.2 10*3/uL (ref 1.7–7.7)
Neutrophils Relative %: 61 %
Platelets: 183 10*3/uL (ref 150–400)
RBC: 4.82 MIL/uL (ref 3.87–5.11)
RDW: 17 % — ABNORMAL HIGH (ref 11.5–15.5)
WBC: 11.6 10*3/uL — ABNORMAL HIGH (ref 4.0–10.5)
nRBC: 0 % (ref 0.0–0.2)

## 2022-12-18 LAB — CBG MONITORING, ED
Glucose-Capillary: 102 mg/dL — ABNORMAL HIGH (ref 70–99)
Glucose-Capillary: 28 mg/dL — CL (ref 70–99)
Glucose-Capillary: 76 mg/dL (ref 70–99)
Glucose-Capillary: 143 mg/dL — ABNORMAL HIGH (ref 70–99)

## 2022-12-18 LAB — HCG, SERUM, QUALITATIVE: Preg, Serum: NEGATIVE

## 2022-12-18 LAB — TROPONIN I (HIGH SENSITIVITY)
Troponin I (High Sensitivity): 9 ng/L (ref <18)
Troponin I (High Sensitivity): 7 ng/L (ref <18)

## 2022-12-18 MED ORDER — OXYCODONE-ACETAMINOPHEN 5-325 MG PO TABS
1.0000 | ORAL_TABLET | Freq: Once | ORAL | Status: AC
  Administered 2022-12-18: 1 via ORAL
  Filled 2022-12-18: qty 1

## 2022-12-18 MED ORDER — FUROSEMIDE 10 MG/ML IJ SOLN
40.0000 mg | Freq: Once | INTRAMUSCULAR | Status: AC
  Administered 2022-12-18: 40 mg via INTRAVENOUS
  Filled 2022-12-18: qty 4

## 2022-12-18 MED ORDER — POLYMYXIN B-TRIMETHOPRIM 10000-0.1 UNIT/ML-% OP SOLN
1.0000 [drp] | OPHTHALMIC | Status: DC
  Administered 2022-12-18: 1 [drp] via OPHTHALMIC
  Filled 2022-12-18: qty 10

## 2022-12-18 MED ORDER — DEXTROSE 50 % IV SOLN
1.0000 | Freq: Once | INTRAVENOUS | Status: AC
  Administered 2022-12-18: 50 mL via INTRAVENOUS
  Filled 2022-12-18: qty 50

## 2022-12-18 NOTE — ED Notes (Signed)
Pt. Seen sitting on side of stretcher snoring. Woke pt. Up and asked if she stil needed to use the bathroom. Pt. Walked to BR.

## 2022-12-18 NOTE — Discharge Instructions (Signed)
Seen today for multiple, nights.  You likely have a viral illness.  Use eyedrops for your eyes.  Make sure that you are washing your hands well.  Your COVID and influenza testing were negative.  You were noted to have a low blood sugar here.  Make sure that you are eating frequent small meals and monitoring her blood sugars closely at home.  You may need to touch base with your primary doctor regarding adjustments in your insulin.  You had no fracture regarding your fall.  Take your medications at home as prescribed.

## 2022-12-18 NOTE — ED Notes (Addendum)
Dr. Dina Rich and primary RN aware of blood sugar of 34.

## 2022-12-18 NOTE — ED Notes (Signed)
Cbg up to 102 after D50W 1 amp given. Continue to give pt. Juice and crackers. PO pain meds given.

## 2022-12-18 NOTE — ED Provider Notes (Signed)
[facility] Provider Note   CSN: [id number] Arrival date & time: 12/18/22  0017     History  Chief Complaint  Patient presents with   Fall   Chest Pain    Jill Shaw is a 47 y.o. female.  HPI     This is a 47 year old female who presents with multiple complaints.  Patient reports that she has had a chest cold for some time.  She reports that yesterday she fell falling on her back.  She does report hitting her head.  No loss of consciousness.  She has had increasing back pain.  At baseline she has chronic pain and takes oxycodone.  She states that this is not helping.  Denies weakness, numbness, tingling of the lower extremities.  Denies bowel or bladder difficulties.  Regarding her chest cold, she reports chest pressure and congestion.  No cough.  No recent fevers.  She reports headache.  Home Medications Prior to Admission medications   Medication Sig Start Date End Date Taking? Authorizing Provider  Accu-Chek Softclix Lancets lancets USE TO TEST AS DIRECTED UP TO FOUR TIMES DAILY. 03/06/22   Shamleffer, Melanie Crazier, MD  albuterol (PROVENTIL) (2.5 MG/3ML) 0.083% nebulizer solution Inhale into the lungs. 09/27/20   [provider]  albuterol (VENTOLIN HFA) 108 (90 Base) MCG/ACT inhaler Inhale 2 puffs into the lungs every 6 (six) hours as needed for wheezing or shortness of breath. 07/18/21   Margaretha Seeds, MD  B-D ULTRAFINE III SHORT PEN 31G X 8 MM MISC USE AS DIRECTED EVERY MORNING, NOON, EVENING, AND AT BEDTIME 07/10/22   Shamleffer, Melanie Crazier, MD  baclofen (LIORESAL) 10 MG tablet Take 1 tablet (10 mg total) by mouth 3 (three) times daily as needed for muscle spasms. 06/21/22   Raulkar, Clide Deutscher, MD  cetirizine (ZYRTEC) 10 MG tablet Take 1 tablet by mouth daily. 06/07/21   [provider]  Continuous Blood Gluc Sensor (FREESTYLE LIBRE 3 SENSOR) MISC Place 1 sensor on the skin every 14 days. Use to check  glucose continuously 07/06/22   Fenton Foy, NP  Dexlansoprazole 30 MG capsule DR Take 1 capsule (30 mg total) by mouth daily. NEEDS OFFICE VISIT FOR ADDITIONAL REFILLS 12/15/22   Fenton Foy, NP  diclofenac Sodium (VOLTAREN) 1 % GEL APPLY 2 GRAMS TOPICALLY TO THE AFFECTED AREA FOUR TIMES DAILY 03/05/22   Sherilyn Cooter, MD  dicyclomine (BENTYL) 20 MG tablet Take 1 tablet (20 mg total) by mouth 2 (two) times daily. 01/20/22   Margarita Mail, PA-C  doxycycline (VIBRAMYCIN) 50 MG capsule Take 50 mg by mouth daily. 07/11/22   [provider]  empagliflozin (JARDIANCE) 25 MG TABS tablet Take 1 tablet (25 mg total) by mouth daily before breakfast. 06/27/21   Shamleffer, Melanie Crazier, MD  famotidine (PEPCID) 40 MG tablet Take 1 tablet (40 mg total) by mouth every evening. 12/05/22   Fenton Foy, NP  fluticasone (FLONASE) 50 MCG/ACT nasal spray Place 2 sprays into both nostrils daily. 12/02/20   Vevelyn Francois, NP  furosemide (LASIX) 40 MG tablet TAKE 1 TABLET(40 MG) BY MOUTH TWICE DAILY 10/02/22   Patwardhan, Manish J, MD  glucose blood (ACCU-CHEK GUIDE) test strip USE TO TEST BLOOD GLUCOSE AS DIRECTED UP TO 4 TIMES DAILY 11/14/22   Fenton Foy, NP  haloperidol (HALDOL) 5 MG tablet Take 1 tablet (5 mg total) by mouth every 8 (eight) hours as needed (For nausea/vomiting). 03/31/22   Molpus,  Jenny Reichmann, MD  HUMALOG KWIKPEN 200 UNIT/ML KwikPen Inject 50 Units into the skin 3 (three) times daily before meals. 07/06/22   Fenton Foy, NP  hydrochlorothiazide (MICROZIDE) 12.5 MG capsule TAKE 1 CAPSULE(12.5 MG) BY MOUTH DAILY Patient taking differently: Take 12.5 mg by mouth daily. 12/14/21   Cantwell, Celeste C, PA-C  hydrOXYzine (ATARAX) 10 MG tablet Take 10 mg by mouth at bedtime. 04/26/22   [provider]  insulin glargine, 2 Unit Dial, (TOUJEO MAX SOLOSTAR) 300 UNIT/ML Solostar Pen Inject 140 Units into the skin daily. 07/06/22   Fenton Foy, NP  isosorbide mononitrate  (IMDUR) 30 MG 24 hr tablet TAKE 1 TABLET(30 MG) BY MOUTH DAILY 03/23/22   Patwardhan, Manish J, MD  losartan (COZAAR) 100 MG tablet TAKE 1 TABLET BY MOUTH EVERY DAY 11/01/22   Patwardhan, Reynold Bowen, MD  megestrol (MEGACE) 40 MG tablet Take two tablets three times daily for 3 days, then two tablets twice daily 10/23/22   Darliss Cheney, MD  metoprolol succinate (TOPROL-XL) 100 MG 24 hr tablet Take 100 mg by mouth daily. Take with or immediately following a meal.    [provider]  ondansetron (ZOFRAN-ODT) 4 MG disintegrating tablet Take 1 tablet (4 mg total) by mouth every 8 (eight) hours as needed for nausea or vomiting. 12/06/22   Fenton Foy, NP  Pimozide 1 MG TABS Take by mouth. 06/13/22   [provider]  potassium chloride SA (KLOR-CON M) 20 MEQ tablet TAKE 1 TABLET(20 MEQ) BY MOUTH DAILY 02/08/22   Lorretta Harp, MD  rosuvastatin (CRESTOR) 20 MG tablet TAKE 1 TABLET(20 MG) BY MOUTH DAILY 10/31/22   Patwardhan, Reynold Bowen, MD  SPIRIVA RESPIMAT 2.5 MCG/ACT AERS INHALE 2 PUFFS INTO THE LUNGS DAILY 02/03/22   Icard, Octavio Graves, DO  spironolactone (ALDACTONE) 50 MG tablet TAKE 1 TABLET(50 MG) BY MOUTH DAILY 12/22/20   Patwardhan, Manish J, MD  sucralfate (CARAFATE) 1 g tablet Take 1 tablet (1 g total) by mouth 4 (four) times daily -  with meals and at bedtime. 08/21/21   Isla Pence, MD  SYMBICORT 160-4.5 MCG/ACT inhaler INHALE 2 PUFFS INTO THE LUNGS TWICE DAILY 10/31/22   Icard, Octavio Graves, DO  tizanidine (ZANAFLEX) 6 MG capsule Take 1 capsule (6 mg total) by mouth 3 (three) times daily. 11/21/22   Raulkar, Clide Deutscher, MD  tretinoin (RETIN-A) 0.025 % cream Apply topically. 08/12/22   [provider]      Allergies    Elavil [amitriptyline], Orudis [ketoprofen], Desyrel [trazodone], Aspirin, Motrin [ibuprofen], Naprosyn [naproxen], Neurontin [gabapentin], Sulfa antibiotics, Ultram [tramadol], Victoza [liraglutide], and Zegerid [omeprazole-sodium bicarbonate]    Review of  Systems   Review of Systems  Constitutional:  Negative for fever.  HENT:  Positive for congestion.   Respiratory:  Positive for chest tightness. Negative for cough and shortness of breath.   Cardiovascular:  Negative for chest pain.  Gastrointestinal:  Negative for abdominal pain.  Musculoskeletal:  Positive for back pain.  Neurological:  Positive for headaches.  All other systems reviewed and are negative.   Physical Exam Updated Vital Signs BP (!) 167/77   Pulse 66   Temp 98.8 F (37.1 C) (Oral)   Resp 20   Ht 1.626 m ('5\' 4"'$ )   Wt 113.4 kg   SpO2 100%   BMI 42.91 kg/m  Physical Exam Vitals and nursing note reviewed.  Constitutional:      Appearance: She is well-developed. She is obese.  HENT:  Head: Normocephalic and atraumatic.     Nose: Congestion present.     Mouth/Throat:     Mouth: Mucous membranes are moist.  Eyes:     Pupils: Pupils are equal, round, and reactive to light.     Comments: Discharge bilateral eyes  Cardiovascular:     Rate and Rhythm: Normal rate and regular rhythm.     Heart sounds: Normal heart sounds.     Comments: Limited secondary to body habitus Pulmonary:     Effort: Pulmonary effort is normal. No respiratory distress.     Breath sounds: No wheezing.  Chest:     Chest wall: Tenderness present.  Abdominal:     General: Bowel sounds are normal.     Palpations: Abdomen is soft.  Musculoskeletal:     Cervical back: Neck supple.  Skin:    General: Skin is warm and dry.  Neurological:     Mental Status: She is alert and oriented to person, place, and time.  Psychiatric:        Mood and Affect: Mood normal.     ED Results / Procedures / Treatments   Labs (all labs ordered are listed, but only abnormal results are displayed) Labs Reviewed  CBC WITH DIFFERENTIAL/PLATELET - Abnormal; Notable for the following components:      Result Value   WBC 11.6 (*)    RDW 17.0 (*)    Abs Immature Granulocytes 0.08 (*)    All other  components within normal limits  BASIC METABOLIC PANEL - Abnormal; Notable for the following components:   Potassium 3.3 (*)    Glucose, Bld 34 (*)    Creatinine, Ser 1.19 (*)    GFR, Estimated 57 (*)    All other components within normal limits  CBG MONITORING, ED - Abnormal; Notable for the following components:   Glucose-Capillary 28 (*)    All other components within normal limits  CBG MONITORING, ED - Abnormal; Notable for the following components:   Glucose-Capillary 102 (*)    All other components within normal limits  CBG MONITORING, ED - Abnormal; Notable for the following components:   Glucose-Capillary 143 (*)    All other components within normal limits  RESP PANEL BY RT-PCR (RSV, FLU A&B, COVID)  RVPGX2  HCG, SERUM, QUALITATIVE  CBG MONITORING, ED  TROPONIN I (HIGH SENSITIVITY)  TROPONIN I (HIGH SENSITIVITY)    EKG EKG Interpretation  Date/Time:  Monday December 18 2022 00:48:33 EST Ventricular Rate:  65 PR Interval:  121 QRS Duration: 89 QT Interval:  404 QTC Calculation: 420 R Axis:   182 Text Interpretation: Sinus rhythm No significant change since last tracing Confirmed by Thayer Jew [phone]) on 12/18/2022 1:26:28 AM  Radiology DG Lumbar Spine Complete  Result Date: 12/18/2022 CLINICAL DATA:  Fall yesterday with back pain. EXAM: LUMBAR SPINE - COMPLETE 4+ VIEW COMPARISON:  11/08/2021. FINDINGS: There is no evidence of lumbar spine fracture. Alignment is normal. Intervertebral disc spaces are maintained. IMPRESSION: Negative. Electronically Signed   By: Brett Fairy M.D.   On: 12/18/2022 04:45   DG Chest Portable 1 View  Result Date: 12/18/2022 CLINICAL DATA:  Chest pain, fall yesterday. EXAM: PORTABLE CHEST 1 VIEW COMPARISON:  09/18/2022. FINDINGS: The heart is enlarged and the mediastinal contour is within normal limits. Pulmonary vasculature is distended. Mild patchy airspace disease is noted at the lung bases. No effusion or pneumothorax. No acute osseous  abnormality. IMPRESSION: 1. Cardiomegaly with pulmonary vascular congestion. 2. Scattered airspace opacities are noted  at the lung bases, possible edema or infiltrate. Electronically Signed   By: Brett Fairy M.D.   On: 12/18/2022 04:43    Procedures .Critical Care  Performed by: Merryl Hacker, MD Authorized by: Merryl Hacker, MD   Critical care provider statement:    Critical care time (minutes):  31   Critical care was necessary to treat or prevent imminent or life-threatening deterioration of the following conditions: hypoglycemia intervention.   Critical care was time spent personally by me on the following activities:  Development of treatment plan with patient or surrogate, discussions with consultants, evaluation of patient's response to treatment, examination of patient, ordering and review of laboratory studies, ordering and review of radiographic studies, ordering and performing treatments and interventions, pulse oximetry, re-evaluation of patient's condition and review of old charts     Medications Ordered in ED Medications  trimethoprim-polymyxin b (POLYTRIM) ophthalmic solution 1 drop (1 drop Both Eyes Given 12/18/22 0510)  oxyCODONE-acetaminophen (PERCOCET/ROXICET) 5-325 MG per tablet 1 tablet (1 tablet Oral Given 12/18/22 0217)  dextrose 50 % solution 50 mL (50 mLs Intravenous Given 12/18/22 0213)  oxyCODONE-acetaminophen (PERCOCET/ROXICET) 5-325 MG per tablet 1 tablet (1 tablet Oral Given 12/18/22 0419)  furosemide (LASIX) injection 40 mg (40 mg Intravenous Given 12/18/22 0510)    ED Course/ Medical Decision Making/ A&P Clinical Course as of 12/18/22 0518  Mon Dec 18, 2022  0148 Blood sugar noted to be 34 on BMP, repeat blood sugar at the bedside 28.  Patient is now somnolent.  On initial evaluation she was tearful and provided a full evaluation without concerns for altered mental status.  She does take insulin but is unable to tell me when she last ate or what she took  this evening.  Patient was given an amp of D50 and orange juice with crackers. [CH]    Clinical Course User Index [CH] Karlee Staff, Barbette Hair, MD                             Medical Decision Making Amount and/or Complexity of Data Reviewed Labs: ordered. Radiology: ordered.  Risk Prescription drug management.   This patient presents to the ED for concern of multiple complaints, this involves an extensive number of treatment options, and is a complaint that carries with it a high risk of complications and morbidity.  I considered the following differential and admission for this acute, potentially life threatening condition.  The differential diagnosis includes viral infection such as COVID, influenza, viral or bacterial conjunctivitis, pneumonia, injury related to fall  MDM:    This is a 47 year old female who presents with multiple complaints.  Initially she reports body aches and viral symptoms.  She does have evidence of conjunctivitis which I highly suspect is viral in nature.  Could be adenovirus.  Given her other complaints, COVID and influenza testing were sent.  Chest x-ray was obtained.  Patient also reports recent fall and back pain.  She has a history of chronic pain syndrome and takes Percocet at baseline.  She was given pain medication.  Of note, patient was noted to be somnolent and her CBG was significantly low on her BMP.  On recheck, bedside CBG 28.  She is unable to provide an insulin administration history for me at that time.  She was given an amp of D50 and allowed to eat.  This progressively improved.  She has a leukocytosis to 11 which appears to be her baseline.  No significant metabolic derangements.  Chest x-ray does not show any evidence of pneumonia.  There is some possible pulmonary edema.  She is difficult to examine given her body habitus for overall volume status.  She does take Lasix.  We have 1 dose of Lasix given reported "congestion and chest pain."  Recommend that  she eat frequent small meals and monitor her blood sugars closely with follow-up with her primary physician.  X-rays of her back showed no evidence of acute fracture.  (Labs, imaging, consults)  Labs: I Ordered, and personally interpreted labs.  The pertinent results include: CBC, BMP, troponin, CBG, COVID, influenza  Imaging Studies ordered: I ordered imaging studies including chest x-ray, lumbar spine films I independently visualized and interpreted imaging. I agree with the radiologist interpretation  Additional history obtained from chart review.  External records from outside source obtained and reviewed including PCP notes regarding pain management  Cardiac Monitoring: The patient was maintained on a cardiac monitor.  If on the cardiac monitor, I personally viewed and interpreted the cardiac monitored which showed an underlying rhythm of: Sinus rhythm  Reevaluation: After the interventions noted above, I reevaluated the patient and found that they have :improved  Social Determinants of Health:  lives independently  Disposition: Discharge  Co morbidities that complicate the patient evaluation  Past Medical History:  Diagnosis Date   Anemia    Anxiety    Arthritis    knees, hands   Asthma    Chronic diastolic (congestive) heart failure (HCC)    COPD (chronic obstructive pulmonary disease) ([place])    Diabetes mellitus ([place])    DKA (diabetic ketoacidosis) ([place])    Dysfunctional uterine bleeding    Gastritis    GERD (gastroesophageal reflux disease)    Hypertension    Intractable nausea and vomiting 03/23/2021   Neuromuscular disorder ([place])    neuropathy feet   Seizures ([place]) 09/12/2017   pt states r/t stress and blood sugar - no meds last one 4 months ago, not seen neurologist   Sickle cell trait ([place])    Smoker    Vitamin D deficiency 10/2019   Wears glasses      Medicines Meds ordered this encounter  Medications   oxyCODONE-acetaminophen (PERCOCET/ROXICET)  5-325 MG per tablet 1 tablet   dextrose 50 % solution 50 mL   oxyCODONE-acetaminophen (PERCOCET/ROXICET) 5-325 MG per tablet 1 tablet   furosemide (LASIX) injection 40 mg   trimethoprim-polymyxin b (POLYTRIM) ophthalmic solution 1 drop    I have reviewed the patients home medicines and have made adjustments as needed  Problem List / ED Course: Problem List Items Addressed This Visit       Endocrine   Hypoglycemia   Other Visit Diagnoses     Viral URI    -  Primary   Viral conjunctivitis       Acute bilateral back pain, unspecified back location       Relevant Medications   oxyCODONE-acetaminophen (PERCOCET/ROXICET) 5-325 MG per tablet 1 tablet (Completed)   oxyCODONE-acetaminophen (PERCOCET/ROXICET) 5-325 MG per tablet 1 tablet (Completed)                   Final Clinical Impression(s) / ED Diagnoses Final diagnoses:  Viral URI  Viral conjunctivitis  Acute bilateral back pain, unspecified back location  Hypoglycemia    Rx / DC Orders ED Discharge Orders     None         Dina Rich, Barbette Hair, MD 12/18/22 [phone]

## 2022-12-18 NOTE — ED Notes (Signed)
Pt. Stated she has pain in her back and head. Apolonio Schneiders Smith,RN in to complete EKG while I was starting IV and drawing blood.Pt. began snoring loudly during this time. After IV was secured, pt. Yelled out," PEE." I asked pt, if she knew where the restroom is and she said yes and went back to sleep. Percocet held while pt. Sleeping.

## 2022-12-18 NOTE — ED Notes (Signed)
Pt eating and drinking at this time.

## 2022-12-18 NOTE — ED Triage Notes (Signed)
Patient reports that she fell yesterday when her right leg gave out.  Patient reports that she hit her head, she is not on blood thinners.  Patient reports "I passed out a little bit."  Patient endorses lower back pain, head pain, and right arm pain.  Patient also reports that she has a chest cold x 4 days and has been having chest pain and congestion with that.

## 2022-12-18 NOTE — ED Notes (Signed)
Pt. Back to bed, hooked up to monitor and O2 sat, BP cuff. Pt. Asked when she was going to get something for pain. Pt. Fell asleep and started snoring before I could answer her. Pain med held at this time.

## 2022-12-18 NOTE — ED Notes (Signed)
Rec'd lab, pt.'s cbg 34. Accucheck done in room, cbg is 28. Juice, crackers, and peanut butter given while trying to regain IV access.

## 2022-12-19 ENCOUNTER — Encounter: Payer: Medicare Other | Admitting: Registered Nurse

## 2022-12-19 ENCOUNTER — Other Ambulatory Visit (HOSPITAL_COMMUNITY): Payer: Self-pay

## 2022-12-19 ENCOUNTER — Other Ambulatory Visit: Payer: Self-pay

## 2022-12-19 ENCOUNTER — Encounter
Payer: Medicare Other | Attending: Physical Medicine and Rehabilitation | Admitting: Physical Medicine and Rehabilitation

## 2022-12-19 ENCOUNTER — Encounter: Payer: Self-pay | Admitting: Physical Medicine and Rehabilitation

## 2022-12-19 DIAGNOSIS — G894 Chronic pain syndrome: Secondary | ICD-10-CM | POA: Insufficient documentation

## 2022-12-19 DIAGNOSIS — Z79891 Long term (current) use of opiate analgesic: Secondary | ICD-10-CM | POA: Insufficient documentation

## 2022-12-19 DIAGNOSIS — M5416 Radiculopathy, lumbar region: Secondary | ICD-10-CM | POA: Insufficient documentation

## 2022-12-19 DIAGNOSIS — Z5181 Encounter for therapeutic drug level monitoring: Secondary | ICD-10-CM | POA: Insufficient documentation

## 2022-12-19 DIAGNOSIS — R519 Headache, unspecified: Secondary | ICD-10-CM | POA: Diagnosis not present

## 2022-12-19 DIAGNOSIS — G4701 Insomnia due to medical condition: Secondary | ICD-10-CM | POA: Insufficient documentation

## 2022-12-19 DIAGNOSIS — G629 Polyneuropathy, unspecified: Secondary | ICD-10-CM | POA: Insufficient documentation

## 2022-12-19 NOTE — Progress Notes (Unsigned)
Subjective:    Patient ID: Jill Shaw, female    DOB: 1976/06/22, 47 y.o.   MRN: [id number]   HPI:   An audio/video tele-health visit is felt to be the most appropriate encounter for this patient at this time. This is a follow up tele-visit via phone. The patient is at home. MD is at office. Prior to scheduling this appointment, our staff discussed the limitations of evaluation and management by telemedicine and the availability of in-person appointments. The patient expressed understanding and agreed to proceed.    Jill Shaw is a 47 y.o. female who returns for f/u of inflammatory gastritis, chronic pain, diabetic peripheral neuropathy, and insomnia.   1) Insomnia: -She continues to experience insomnia at night. Asks about Ambien and I discussed that this is an addictive medication and there are other safer options we can try first that can also help with her pain.  -She is currently not taking either of these medications, or Amitriptyline or Trazodone.  -She takes Requip for resltless legs but this does not help. -She is still sleeping very poorly -She does use screens before bed time  2) Back pain secondary to lumbar radiculitis.  -she says she did not mean to request an early refill today, that she said this wrong, she was trying to explain to [place] -she has received 2 warnings for marijuana in her urine sample as well as for not bringing the number of correct pills to her appointments -she asks if tizanidine can be taken more than 4 times per day -She states her pain is located in her lower back radiating into her bilateral lower extremities and bilateral knee pain. She denies falling. She rates her pain 9. Her current exercise regime is walking.  -Jill Shaw Morphine equivalent is 20.00 MME.  -She was hospitalized for serotonin syndrome after use of Savella and Cymbalta.  -Back pain has been severe -She requests a renewal of her handicap placard as she is unable to walk  200 feet without stopping to rest.  -Her pain has been better controlled with Norco -she would like to get another XR given the chronicity of her pain.  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 -she asks whether Qutenza can be administered more frequently than every 3 months, it has been helping a lot.  -she is ready to do Qutenza today. -she finds great benefits in her legs and found benefit in her hands as well but she kept forgetting and touching her face and this caused her face to [place] -she is visiting her mother who has a pneumonia and asks whether her medications can be filled early -she asks whether her oxycodone can be released early as she is going on vacation next week.  -She tolerated Qutenza well and this provided 2 weeks of good relief, benefit started right away.  -Her peripheral neuropathy is worse in her bilateral lower extremities, especially the dorsum and soles of both feet, including the toes.  -now present in her hands as well- she has been dropping objects due to her neuropathy- present on the dorsal and ventral aspects of her hands -she is interested in following with  a dietician -she is interested in medicines for her diabetes -she is trying to lose weight  -needs handicap placard  4) Right shoulder and arm pain -notes limited range of motion in her shoulder -does have pain radiating into right arm from neck and arm feels heavy at times  5) Inflammatory gastritis -has been back and forth to the hospital as this has been really hurting her. She was given pain medication in the hospital. She was given medication for emesis.  -flexeril is making her sicker.  --patient  says she has recently been diagnosed with this condition and pain has been severe. -she has her gastroenterologist recommended that she discuss with [place] increasing her Percocet to better control her pain -she is currently prescribed 3 Percocet per day but feels she would benefit from up to 5 per day  6) GERD -she can have severe reflex at times -she was started on pepcid for a short time period to help with this.   7) Morbid obesity -BMI 47.03 -weight is 274 lbs -she lose 40 lbs when she was sick since she could not tolerate food but she has gained a lot of this back -she asks about supplement she can take for weight loss  8) Migraines: -she has been having headaches and asks if she can get a scan of her head.   Pain Inventory Average Pain 9 Pain Right Now 9 My pain is sharp, burning, tingling and aching  In the last 24 hours, has pain interfered with the following? General activity 0 Relation with others 0 Enjoyment of life 5 What TIME of day is your pain at its worst? morning , daytime, evening and night Sleep (in general) Poor  Pain is worse with: walking, bending, sitting, inactivity, standing and some activites Pain improves with: heat/ice and medication Relief from Meds: 63  Family History  Problem Relation Age of Onset   Diabetes Mother    Hypertension Mother    Migraines Paternal Grandfather    Colon cancer Neg Hx    Esophageal cancer Neg Hx    Stomach cancer Neg Hx    Rectal cancer Neg Hx    Social History   Socioeconomic History   Marital status: Divorced    Spouse name: Not on file   Number of children: 3   Years of education: Not on file   Highest education level: Not on file  Occupational History   Occupation: unemployed  Tobacco Use   Smoking status: Some Days    Packs/day: 0.25    Years: 26.00    Total pack years: 6.50    Types: Cigarettes    Last attempt to quit: 09/13/2020    Years since quitting: 2.2   Smokeless tobacco: Never   Tobacco  comments:    4-5 cigarettes/day  Vaping Use   Vaping Use: Never used  Substance and Sexual Activity   Alcohol use: No   Drug use: No   Sexual activity: Not Currently    Birth control/protection: None  Other Topics Concern   Not on file  Social History Narrative   Right Handed   Lives in a one story apartment, but lives on the second floor   Drinks caffeine once in awhile   Social Determinants of Health   Financial Resource Strain: Not on file  Food Insecurity: Food Insecurity Present (12/08/2021)   Hunger Vital Sign    Worried About Running Out of Food in the Last Year: Never true    Ran Out of  Food in the Last Year: Sometimes true  Transportation Needs: No Transportation Needs (12/08/2021)   PRAPARE - Hydrologist (Medical): No    Lack of Transportation (Non-Medical): No  Physical Activity: Not on file  Stress: Not on file  Social Connections: Not on file   Past Surgical History:  Procedure Laterality Date   CESAREAN SECTION     x 1. for twins   DILATION AND CURETTAGE OF UTERUS N/A 08/20/2019   Procedure: DILATATION AND CURETTAGE;  Surgeon: Emily Filbert, MD;  Location: [place];  Service: Gynecology;  Laterality: N/A;   ENDOMETRIAL ABLATION N/A 08/20/2019   Procedure: Minerva Ablation;  Surgeon: Emily Filbert, MD;  Location: [place];  Service: Gynecology;  Laterality: N/A;   EYE SURGERY Bilateral    laser right and cataract removed left eye   LEFT HEART CATH AND CORONARY ANGIOGRAPHY N/A 08/30/2021   Procedure: LEFT HEART CATH AND CORONARY ANGIOGRAPHY;  Surgeon: Nigel Mormon, MD;  Location: [facility];  Service: Cardiovascular;  Laterality: N/A;   RADIOLOGY WITH ANESTHESIA N/A 09/16/2019   Procedure: MRI WITH ANESTHESIA   L SPINE WITHOUT CONTRAST, T SPINE WITHOUT CONTRAST , CERVICAL WITHOUT CONTRAST;  Surgeon: Radiologist, Medication, MD;  Location: [place];  Service: Radiology;  Laterality: N/A;   TUBAL LIGATION     interval BTL   UPPER  GI ENDOSCOPY  07/2017   Past Surgical History:  Procedure Laterality Date   CESAREAN SECTION     x 1. for twins   DILATION AND CURETTAGE OF UTERUS N/A 08/20/2019   Procedure: DILATATION AND CURETTAGE;  Surgeon: Emily Filbert, MD;  Location: [facility];  Service: Gynecology;  Laterality: N/A;   ENDOMETRIAL ABLATION N/A 08/20/2019   Procedure: Minerva Ablation;  Surgeon: Emily Filbert, MD;  Location: [place];  Service: Gynecology;  Laterality: N/A;   EYE SURGERY Bilateral    laser right and cataract removed left eye   LEFT HEART CATH AND CORONARY ANGIOGRAPHY N/A 08/30/2021   Procedure: LEFT HEART CATH AND CORONARY ANGIOGRAPHY;  Surgeon: Nigel Mormon, MD;  Location: [facility];  Service: Cardiovascular;  Laterality: N/A;   RADIOLOGY WITH ANESTHESIA N/A 09/16/2019   Procedure: MRI WITH ANESTHESIA   L SPINE WITHOUT CONTRAST, T SPINE WITHOUT CONTRAST , CERVICAL WITHOUT CONTRAST;  Surgeon: Radiologist, Medication, MD;  Location: [facility];  Service: Radiology;  Laterality: N/A;   TUBAL LIGATION     interval BTL   UPPER GI ENDOSCOPY  07/2017   Past Medical History:  Diagnosis Date   Anemia    Anxiety    Arthritis    knees, hands   Asthma    Chronic diastolic (congestive) heart failure (HCC)    COPD (chronic obstructive pulmonary disease) (HCC)    Diabetes mellitus ([place])    DKA (diabetic ketoacidosis) (HCC)    Dysfunctional uterine bleeding    Gastritis    GERD (gastroesophageal reflux disease)    Hypertension    Intractable nausea and vomiting 03/23/2021   Neuromuscular disorder ([place])    neuropathy feet   Seizures ([place]) 09/12/2017   pt states r/t stress and blood sugar - no meds last one 4 months ago, not seen neurologist   Sickle cell trait ([place])    Smoker    Vitamin D deficiency 10/2019   Wears glasses    There were no vitals taken for this visit.  Opioid Risk Score:   Fall Risk Score:  `1  Depression  screen PHQ 2/9     10/24/2022   10:03 AM 09/05/2022    2:45 PM  07/17/2022   11:04 AM 07/04/2022    8:58 AM 06/01/2022    9:05 AM 05/03/2022    1:52 PM 03/17/2022    2:08 PM  Depression screen PHQ 2/9  Decreased Interest 0 0 0 0 0 0 0  Down, Depressed, Hopeless 0 0 0 0 0 0 0  PHQ - 2 Score 0 0 0 0 0 0 0  Altered sleeping  2     0  Tired, decreased energy  2     0  Change in appetite  0     0  Feeling bad or failure about yourself   0     0  Trouble concentrating  0     0  Moving slowly or fidgety/restless  0     0  Suicidal thoughts  0     0  PHQ-9 Score  4     0     Review of Systems  Constitutional:  Positive for diaphoresis and unexpected weight change.  Respiratory:  Positive for shortness of breath and wheezing.   Gastrointestinal:  Positive for abdominal pain, constipation and nausea.  Musculoskeletal:  Positive for arthralgias, back pain, gait problem and myalgias.  Neurological:  Positive for dizziness, weakness and numbness.  Hematological:  Bruises/bleeds easily.  Psychiatric/Behavioral:  Positive for confusion and decreased concentration. The patient is nervous/anxious.   All other systems reviewed and are negative.      Objective:  Not performed     Assessment & Plan:  1. Lumbar Radiculitis: Continue current medication regimen. Continue HEP as Tolerated. Provided with handicap placard. XR ordered. Note for work provided via Pharmacist, community.  2. Fibromyalgia: Continue HEP as Tolerated. Continue current Medication regimen. Continue to Monitor.  3. Bilateral Knee Pain: Right knee is currently worst, XR ordered and will call with results.  Continue to Monitor.  4. Chronic Pain Syndrome: Continue Percocet. Discussed that we cannot send early refills if medications are lost. Increase percocet to 6 tabs per day prn. Will send final warning letter to patient today to say that we do not permit early refills, discussed this with her pharmacy as well -provided note explaining her inability to work due to her pain 5. Insomnia: -Try to go outside  near sunrise -Get exercise during the day.  -Discussed good sleep hygiene: turning off all devices an hour before bedtime.  -Chamomile tea with dinner.  -Melatonin did for help. -warm bath or shower before bed. -apply lavender oil for forehead at night  6. Diabetic peripheral neuropathy -provided note for son to give to work to indicate that patient needs his assistance 24/7 due to her mobility deficits -checked with Qutenza rep and medicaid/medicare will not cover Qutenza more frequently than every 3 months -will request 6 patches next visit to cover her hands as well -refilled tizanidine Continue percocet, refilled -Discussed Qutenza as an option for neuropathic pain control. Discussed that this is a capsaicin patch, stronger than capsaicin cream. Discussed that it is currently approved for diabetic peripheral neuropathy and post-herpetic neuralgia, but that it has also shown benefit in treating other forms of neuropathy. Provided patient with link to site to learn more about the patch: [employer]. Discussed that the patch would be placed in office and benefits usually last 3 months. Discussed that unintended exposure to capsaicin can cause severe irritation of eyes, mucous membranes, respiratory tract, and skin, but that  Qutenza is a local treatment and does not have the systemic side effects of other nerve medications. Discussed that there may be pain, itching, erythema, and decreased sensory function associated with the application of Qutenza. Side effects usually subside within 1 week. A cold pack of analgesic medications can help with these side effects. Blood pressure can also be increased due to pain associated with administration of the patch.   7. Right sided shoulder and arm pain -discussed that could be a component or frozen shoulder and/or cervical radiculitis -will obtain right shoulder XR and cervical spine XR and call when results are available  8.  Obesity: -Educated that current weight is 274 lbs and current BMI is 47.03 -shared foods that can assist in weight loss: 1) leafy greens- high in fiber and nutrients 2) dark chocolate- improves metabolism (if prefer sweetened, best to sweeten with honey instead of sugar).  3) cruciferous vegetables- high in fiber and protein 4) full fat yogurt: high in healthy fat, protein, calcium, and probiotics 5) apples- high in a variety of phytochemicals 6) nuts- high in fiber and protein that increase feelings of fullness 7) grapefruit: rich in nutrients, antioxidants, and fiber (not to be taken with anticoagulation) 8) beans- high in protein and fiber 9) salmon- has high quality protein and healthy fats 10) green tea- rich in polyphenols 11) eggs- rich in choline and vitamin D 12) tuna- high protein, boosts metabolism 13) avocado- decreases visceral abdominal fat 14) chicken (pasture raised): high in protein and iron 15) blueberries- reduce abdominal fat and cholesterol 16) whole grains- decreases calories retained during digestion, speeds metabolism 17) chia seeds- curb appetite 18) chilies- increases fat metabolism  -Discussed supplements that can be used:  1) Metatrim '400mg'$  BID 30 minutes before breakfast and dinner  2) Sphaeranthus indicus and Garcinia mangostana (combinations of these and #1 can be found in capsicum and zychrome  3) green coffee bean extract '400mg'$  twice per day or Irvingia (african mango) 150 to '300mg'$  twice per day.    9) Gastroesophageal reflux disease  -discussed the interaction between Tizanidine and Pepcid and that their additive effects could cause hypotension and bradycardia. -recommended eating small meals -recommended avoiding acidic and spicy foods -recommended apple cider vinegar in a cup of water before meals.   10) Inflammatory gastritis: -recommended following up with GI  11) Migraines: -Head CT ordered  5 minutes spent in discussion of recent hospital  discharge, her current migraines, ordering CT Head for her, that she needs her Percocet refill

## 2022-12-20 ENCOUNTER — Other Ambulatory Visit: Payer: Self-pay

## 2022-12-20 ENCOUNTER — Encounter: Payer: Self-pay | Admitting: Physical Medicine and Rehabilitation

## 2022-12-20 MED ORDER — OXYCODONE-ACETAMINOPHEN 10-325 MG PO TABS
1.0000 | ORAL_TABLET | ORAL | 0 refills | Status: DC | PRN
  Filled 2022-12-20 – 2022-12-22 (×2): qty 180, 30d supply, fill #0

## 2022-12-20 MED ORDER — POTASSIUM CHLORIDE CRYS ER 20 MEQ PO TBCR
20.0000 meq | EXTENDED_RELEASE_TABLET | Freq: Every day | ORAL | 0 refills | Status: DC
  Filled 2022-12-20: qty 30, 30d supply, fill #0

## 2022-12-21 ENCOUNTER — Other Ambulatory Visit: Payer: Self-pay

## 2022-12-21 ENCOUNTER — Encounter (HOSPITAL_BASED_OUTPATIENT_CLINIC_OR_DEPARTMENT_OTHER): Payer: Self-pay

## 2022-12-21 ENCOUNTER — Emergency Department (HOSPITAL_BASED_OUTPATIENT_CLINIC_OR_DEPARTMENT_OTHER)
Admission: EM | Admit: 2022-12-21 | Discharge: 2022-12-21 | Disposition: A | Discharge status: Home / Self Care | Payer: Medicare Other | Attending: Emergency Medicine | Admitting: Emergency Medicine

## 2022-12-21 ENCOUNTER — Emergency Department (HOSPITAL_BASED_OUTPATIENT_CLINIC_OR_DEPARTMENT_OTHER)
Admission: EM | Admit: 2022-12-21 | Discharge: 2022-12-22 | Disposition: A | Discharge status: Home / Self Care | Payer: Medicare Other | Source: Home / Self Care | Attending: Emergency Medicine | Admitting: Emergency Medicine

## 2022-12-21 ENCOUNTER — Telehealth: Payer: Self-pay

## 2022-12-21 ENCOUNTER — Ambulatory Visit: Payer: Medicare Other

## 2022-12-21 ENCOUNTER — Emergency Department (HOSPITAL_BASED_OUTPATIENT_CLINIC_OR_DEPARTMENT_OTHER): Payer: Medicare Other

## 2022-12-21 DIAGNOSIS — R1084 Generalized abdominal pain: Secondary | ICD-10-CM | POA: Diagnosis not present

## 2022-12-21 DIAGNOSIS — E119 Type 2 diabetes mellitus without complications: Secondary | ICD-10-CM | POA: Insufficient documentation

## 2022-12-21 DIAGNOSIS — Z794 Long term (current) use of insulin: Secondary | ICD-10-CM | POA: Insufficient documentation

## 2022-12-21 DIAGNOSIS — I10 Essential (primary) hypertension: Secondary | ICD-10-CM | POA: Insufficient documentation

## 2022-12-21 DIAGNOSIS — G8929 Other chronic pain: Secondary | ICD-10-CM

## 2022-12-21 DIAGNOSIS — R112 Nausea with vomiting, unspecified: Secondary | ICD-10-CM | POA: Insufficient documentation

## 2022-12-21 DIAGNOSIS — R1110 Vomiting, unspecified: Secondary | ICD-10-CM | POA: Diagnosis not present

## 2022-12-21 DIAGNOSIS — Z79899 Other long term (current) drug therapy: Secondary | ICD-10-CM | POA: Insufficient documentation

## 2022-12-21 DIAGNOSIS — J449 Chronic obstructive pulmonary disease, unspecified: Secondary | ICD-10-CM | POA: Insufficient documentation

## 2022-12-21 DIAGNOSIS — R109 Unspecified abdominal pain: Secondary | ICD-10-CM | POA: Diagnosis not present

## 2022-12-21 DIAGNOSIS — E860 Dehydration: Secondary | ICD-10-CM | POA: Insufficient documentation

## 2022-12-21 DIAGNOSIS — R1115 Cyclical vomiting syndrome unrelated to migraine: Secondary | ICD-10-CM

## 2022-12-21 LAB — CBG MONITORING, ED: Glucose-Capillary: 137 mg/dL — ABNORMAL HIGH (ref 70–99)

## 2022-12-21 LAB — BASIC METABOLIC PANEL
Anion gap: 18 — ABNORMAL HIGH (ref 5–15)
BUN: 15 mg/dL (ref 6–20)
CO2: 18 mmol/L — ABNORMAL LOW (ref 22–32)
Calcium: 10.3 mg/dL (ref 8.9–10.3)
Chloride: 103 mmol/L (ref 98–111)
Creatinine, Ser: 1.9 mg/dL — ABNORMAL HIGH (ref 0.44–1.00)
GFR, Estimated: 33 mL/min — ABNORMAL LOW (ref >60)
Glucose, Bld: 139 mg/dL — ABNORMAL HIGH (ref 70–99)
Potassium: 5.1 mmol/L (ref 3.5–5.1)
Sodium: 139 mmol/L (ref 135–145)

## 2022-12-21 LAB — COMPREHENSIVE METABOLIC PANEL
ALT: 5 U/L (ref 0–44)
AST: 11 U/L — ABNORMAL LOW (ref 15–41)
Albumin: 4.3 g/dL (ref 3.5–5.0)
Alkaline Phosphatase: 66 U/L (ref 38–126)
Anion gap: 10 (ref 5–15)
BUN: 13 mg/dL (ref 6–20)
CO2: 25 mmol/L (ref 22–32)
Calcium: 9.9 mg/dL (ref 8.9–10.3)
Chloride: 104 mmol/L (ref 98–111)
Creatinine, Ser: 1.66 mg/dL — ABNORMAL HIGH (ref 0.44–1.00)
GFR, Estimated: 38 mL/min — ABNORMAL LOW (ref >60)
Glucose, Bld: 77 mg/dL (ref 70–99)
Potassium: 3.8 mmol/L (ref 3.5–5.1)
Sodium: 139 mmol/L (ref 135–145)
Total Bilirubin: 0.9 mg/dL (ref 0.3–1.2)
Total Protein: 7.8 g/dL (ref 6.5–8.1)

## 2022-12-21 LAB — LIPASE, BLOOD: Lipase: 10 U/L — ABNORMAL LOW (ref 11–51)

## 2022-12-21 LAB — URINALYSIS, ROUTINE W REFLEX MICROSCOPIC
Bacteria, UA: NONE SEEN
Bilirubin Urine: NEGATIVE
Glucose, UA: NEGATIVE mg/dL
Hgb urine dipstick: NEGATIVE
Ketones, ur: NEGATIVE mg/dL
Leukocytes,Ua: NEGATIVE
Nitrite: NEGATIVE
Protein, ur: NEGATIVE mg/dL
Specific Gravity, Urine: 1.02 (ref 1.005–1.030)
pH: 5 (ref 5.0–8.0)

## 2022-12-21 LAB — CBC
HCT: 41.5 % (ref 36.0–46.0)
Hemoglobin: 14.1 g/dL (ref 12.0–15.0)
MCH: 27.2 pg (ref 26.0–34.0)
MCHC: 34 g/dL (ref 30.0–36.0)
MCV: 80 fL (ref 80.0–100.0)
Platelets: 299 10*3/uL (ref 150–400)
RBC: 5.19 MIL/uL — ABNORMAL HIGH (ref 3.87–5.11)
RDW: 16.4 % — ABNORMAL HIGH (ref 11.5–15.5)
WBC: 13.2 10*3/uL — ABNORMAL HIGH (ref 4.0–10.5)
nRBC: 0 % (ref 0.0–0.2)

## 2022-12-21 LAB — PREGNANCY, URINE: Preg Test, Ur: NEGATIVE

## 2022-12-21 LAB — TROPONIN I (HIGH SENSITIVITY): Troponin I (High Sensitivity): 17 ng/L (ref <18)

## 2022-12-21 MED ORDER — POLYETHYLENE GLYCOL 3350 17 G PO PACK
17.0000 g | PACK | Freq: Every day | ORAL | 0 refills | Status: DC
  Filled 2022-12-21 – 2023-02-05 (×2): qty 14, 14d supply, fill #0

## 2022-12-21 MED ORDER — LACTATED RINGERS IV BOLUS
1000.0000 mL | Freq: Once | INTRAVENOUS | Status: AC
  Administered 2022-12-21: 1000 mL via INTRAVENOUS

## 2022-12-21 MED ORDER — LACTATED RINGERS IV BOLUS
500.0000 mL | Freq: Once | INTRAVENOUS | Status: AC
  Administered 2022-12-21: 500 mL via INTRAVENOUS

## 2022-12-21 MED ORDER — DICYCLOMINE HCL 20 MG PO TABS
20.0000 mg | ORAL_TABLET | Freq: Two times a day (BID) | ORAL | 0 refills | Status: DC | PRN
  Filled 2022-12-21: qty 20, 10d supply, fill #0

## 2022-12-21 MED ORDER — SENNOSIDES-DOCUSATE SODIUM 8.6-50 MG PO TABS
1.0000 | ORAL_TABLET | Freq: Every day | ORAL | 0 refills | Status: AC
  Filled 2022-12-21 – 2023-09-19 (×2): qty 30, 30d supply, fill #0

## 2022-12-21 MED ORDER — HYDROMORPHONE HCL 1 MG/ML IJ SOLN
1.0000 mg | Freq: Once | INTRAMUSCULAR | Status: AC
  Administered 2022-12-21: 1 mg via INTRAVENOUS
  Filled 2022-12-21: qty 1

## 2022-12-21 MED ORDER — IOHEXOL 300 MG/ML  SOLN
100.0000 mL | Freq: Once | INTRAMUSCULAR | Status: AC | PRN
  Administered 2022-12-21: 100 mL via INTRAVENOUS

## 2022-12-21 MED ORDER — DROPERIDOL 2.5 MG/ML IJ SOLN
1.2500 mg | Freq: Once | INTRAMUSCULAR | Status: AC
  Administered 2022-12-21: 1.25 mg via INTRAVENOUS
  Filled 2022-12-21: qty 2

## 2022-12-21 MED ORDER — DROPERIDOL 2.5 MG/ML IJ SOLN
2.5000 mg | Freq: Once | INTRAMUSCULAR | Status: AC
  Administered 2022-12-21: 2.5 mg via INTRAVENOUS
  Filled 2022-12-21: qty 2

## 2022-12-21 NOTE — Transitions of Care (Post Inpatient/ED Visit) (Signed)
   12/21/2022  Name: Jill Shaw MRN: [id number] DOB: October 06, 1976  Today's TOC FU Call Status:    Attempted to reach the patient regarding the most recent Inpatient/ED visit.  Follow Up Plan: Additional outreach attempts will be made to reach the patient to complete the Transitions of Care (Post Inpatient/ED visit) call.   [place] RMA

## 2022-12-21 NOTE — ED Triage Notes (Signed)
Patient arrives to ED POV c/o Abdominal Pain, N/V/D. Pt states this started yesterday and had taken OTC meds with no relief. No other complaints at this time. Pt A/O x4.

## 2022-12-21 NOTE — ED Provider Notes (Signed)
[facility] Provider Note   CSN: [id number] Arrival date & time: 12/21/22  [id number]     History  Chief Complaint  Patient presents with   Abdominal Pain    Jill Shaw is a 47 y.o. female.  With PMH of COPD, DM, HTN, GERD, chronic pain who presents with diffuse stabbing abdominal pain associate with nausea and vomiting and loose stools.  Patient took her oxycodone 4 hours prior to arrival without relief.  She said symptoms started with pain and then vomiting.  She also has had some congestion and rhinorrhea.  She has no history of abdominal surgeries other than C-section.  She still has her gallbladder and appendix.  She is complaining of diffuse stabbing pain.  No fevers, no chills, no urinary symptoms.  Last menstrual period about 2 weeks ago. No CP or SOB.   Abdominal Pain      Home Medications Prior to Admission medications   Medication Sig Start Date End Date Taking? Authorizing Provider  dicyclomine (BENTYL) 20 MG tablet Take 1 tablet (20 mg total) by mouth 2 (two) times daily as needed (abdominal cramping). 12/21/22  Yes Elgie Congo, MD  polyethylene glycol (MIRALAX) 17 g packet Take 17 g by mouth daily. 12/21/22  Yes Elgie Congo, MD  senna-docusate (SENOKOT-S) 8.6-50 MG tablet Take 1 tablet by mouth daily. 12/21/22 01/20/23 Yes Elgie Congo, MD  Accu-Chek Softclix Lancets lancets USE TO TEST AS DIRECTED UP TO FOUR TIMES DAILY. 03/06/22   Shamleffer, Melanie Crazier, MD  albuterol (PROVENTIL) (2.5 MG/3ML) 0.083% nebulizer solution Inhale into the lungs. 09/27/20   [provider]  albuterol (VENTOLIN HFA) 108 (90 Base) MCG/ACT inhaler Inhale 2 puffs into the lungs every 6 (six) hours as needed for wheezing or shortness of breath. 07/18/21   Margaretha Seeds, MD  B-D ULTRAFINE III SHORT PEN 31G X 8 MM MISC USE AS DIRECTED EVERY MORNING, NOON, EVENING, AND AT BEDTIME 07/10/22   Shamleffer, Melanie Crazier, MD   baclofen (LIORESAL) 10 MG tablet Take 1 tablet (10 mg total) by mouth 3 (three) times daily as needed for muscle spasms. 06/21/22   Raulkar, Clide Deutscher, MD  cetirizine (ZYRTEC) 10 MG tablet Take 1 tablet by mouth daily. 06/07/21   [provider]  Continuous Blood Gluc Sensor (FREESTYLE LIBRE 3 SENSOR) MISC Place 1 sensor on the skin every 14 days. Use to check glucose continuously 07/06/22   Fenton Foy, NP  Dexlansoprazole 30 MG capsule DR Take 1 capsule (30 mg total) by mouth daily. NEEDS OFFICE VISIT FOR ADDITIONAL REFILLS 12/15/22   Fenton Foy, NP  diclofenac Sodium (VOLTAREN) 1 % GEL APPLY 2 GRAMS TOPICALLY TO THE AFFECTED AREA FOUR TIMES DAILY 03/05/22   Sherilyn Cooter, MD  dicyclomine (BENTYL) 20 MG tablet Take 1 tablet (20 mg total) by mouth 2 (two) times daily. 01/20/22   Margarita Mail, PA-C  doxycycline (VIBRAMYCIN) 50 MG capsule Take 50 mg by mouth daily. 07/11/22   [provider]  empagliflozin (JARDIANCE) 25 MG TABS tablet Take 1 tablet (25 mg total) by mouth daily before breakfast. 06/27/21   Shamleffer, Melanie Crazier, MD  famotidine (PEPCID) 40 MG tablet Take 1 tablet (40 mg total) by mouth every evening. 12/05/22   Fenton Foy, NP  fluticasone (FLONASE) 50 MCG/ACT nasal spray Place 2 sprays into both nostrils daily. 12/02/20   Vevelyn Francois, NP  furosemide (LASIX) 40 MG tablet TAKE 1 TABLET(40 MG) BY  MOUTH TWICE DAILY 10/02/22   Patwardhan, Manish J, MD  glucose blood (ACCU-CHEK GUIDE) test strip USE TO TEST BLOOD GLUCOSE AS DIRECTED UP TO 4 TIMES DAILY 11/14/22   Fenton Foy, NP  haloperidol (HALDOL) 5 MG tablet Take 1 tablet (5 mg total) by mouth every 8 (eight) hours as needed (For nausea/vomiting). 03/31/22   Molpus, John, MD  HUMALOG KWIKPEN 200 UNIT/ML KwikPen Inject 50 Units into the skin 3 (three) times daily before meals. 07/06/22   Fenton Foy, NP  hydrochlorothiazide (MICROZIDE) 12.5 MG capsule TAKE 1 CAPSULE(12.5 MG) BY MOUTH  DAILY Patient taking differently: Take 12.5 mg by mouth daily. 12/14/21   Cantwell, Celeste C, PA-C  hydrOXYzine (ATARAX) 10 MG tablet Take 10 mg by mouth at bedtime. 04/26/22   [provider]  insulin glargine, 2 Unit Dial, (TOUJEO MAX SOLOSTAR) 300 UNIT/ML Solostar Pen Inject 140 Units into the skin daily. 07/06/22   Fenton Foy, NP  isosorbide mononitrate (IMDUR) 30 MG 24 hr tablet TAKE 1 TABLET(30 MG) BY MOUTH DAILY 03/23/22   Patwardhan, Manish J, MD  losartan (COZAAR) 100 MG tablet TAKE 1 TABLET BY MOUTH EVERY DAY 11/01/22   Patwardhan, Reynold Bowen, MD  megestrol (MEGACE) 40 MG tablet Take two tablets three times daily for 3 days, then two tablets twice daily 10/23/22   Darliss Cheney, MD  metoprolol succinate (TOPROL-XL) 100 MG 24 hr tablet Take 100 mg by mouth daily. Take with or immediately following a meal.    [provider]  ondansetron (ZOFRAN-ODT) 4 MG disintegrating tablet Take 1 tablet (4 mg total) by mouth every 8 (eight) hours as needed for nausea or vomiting. 12/06/22   Fenton Foy, NP  oxyCODONE-acetaminophen (PERCOCET) 10-325 MG tablet Take 1 tablet by mouth every 4 (four) hours as needed for pain. 12/20/22   Raulkar, Clide Deutscher, MD  Pimozide 1 MG TABS Take by mouth. 06/13/22   [provider]  potassium chloride SA (KLOR-CON M) 20 MEQ tablet Take 1 tablet (20 mEq total) by mouth daily. 12/20/22   Fenton Foy, NP  rosuvastatin (CRESTOR) 20 MG tablet TAKE 1 TABLET(20 MG) BY MOUTH DAILY 10/31/22   Patwardhan, Reynold Bowen, MD  SPIRIVA RESPIMAT 2.5 MCG/ACT AERS INHALE 2 PUFFS INTO THE LUNGS DAILY 02/03/22   Icard, Octavio Graves, DO  spironolactone (ALDACTONE) 50 MG tablet TAKE 1 TABLET(50 MG) BY MOUTH DAILY 12/22/20   Patwardhan, Manish J, MD  sucralfate (CARAFATE) 1 g tablet Take 1 tablet (1 g total) by mouth 4 (four) times daily -  with meals and at bedtime. 08/21/21   Isla Pence, MD  SYMBICORT 160-4.5 MCG/ACT inhaler INHALE 2 PUFFS INTO THE LUNGS TWICE DAILY  10/31/22   Icard, Octavio Graves, DO  tizanidine (ZANAFLEX) 6 MG capsule Take 1 capsule (6 mg total) by mouth 3 (three) times daily. 11/21/22   Raulkar, Clide Deutscher, MD  tretinoin (RETIN-A) 0.025 % cream Apply topically. 08/12/22   [provider]      Allergies    Elavil [amitriptyline], Orudis [ketoprofen], Desyrel [trazodone], Aspirin, Motrin [ibuprofen], Naprosyn [naproxen], Neurontin [gabapentin], Sulfa antibiotics, Ultram [tramadol], Victoza [liraglutide], and Zegerid [omeprazole-sodium bicarbonate]    Review of Systems   Review of Systems  Gastrointestinal:  Positive for abdominal pain.    Physical Exam Updated Vital Signs BP 138/81   Pulse (!) 109   Temp 97.8 F (36.6 C) (Oral)   Resp (!) 22   Ht '5\' 4"'$  (1.626 m)   Wt 113.4 kg  SpO2 98%   BMI 42.91 kg/m  Physical Exam Constitutional: Alert and oriented.  Uncomfortable appearing but no acute distress Eyes: Conjunctivae are normal. ENT      Head: Normocephalic and atraumatic. Cardiovascular: S1, S2, tachycardic, regular rhythm, normal and symmetric distal pulses are present in all extremities.Warm and well perfused. Respiratory: Mildly tachypneic, breath sounds clear, O2 sat 100% on room air Gastrointestinal: Obese but soft, no localization of tenderness, diffuse tenderness on exam, no rebound or guarding, previous C-section scar clean dry and intact Musculoskeletal: Normal range of motion in all extremities. No pitting edema of the lower extremities Neurologic: Normal speech and language. No gross focal neurologic deficits are appreciated. Skin: Skin is warm, dry and intact. No rash noted. Psychiatric: Mood and affect are normal. Speech and behavior are normal.  ED Results / Procedures / Treatments   Labs (all labs ordered are listed, but only abnormal results are displayed) Labs Reviewed  LIPASE, BLOOD - Abnormal; Notable for the following components:      Result Value   Lipase 10 (*)    All other components  within normal limits  COMPREHENSIVE METABOLIC PANEL - Abnormal; Notable for the following components:   Creatinine, Ser 1.66 (*)    AST 11 (*)    GFR, Estimated 38 (*)    All other components within normal limits  CBC - Abnormal; Notable for the following components:   WBC 13.2 (*)    RBC 5.19 (*)    RDW 16.4 (*)    All other components within normal limits  URINALYSIS, ROUTINE W REFLEX MICROSCOPIC  PREGNANCY, URINE    EKG EKG Interpretation  Date/Time:  Thursday December 21 2022 07:27:49 EST Ventricular Rate:  121 PR Interval:  140 QRS Duration: 80 QT Interval:  309 QTC Calculation: 439 R Axis:   65 Text Interpretation: Sinus tachycardia Nonspecific T abnormalities, inferior leads  and lateral leads unchanged from prior Confirmed by Georgina Snell [phone]) on 12/21/2022 7:58:50 AM  Radiology CT ABDOMEN PELVIS W CONTRAST  Result Date: 12/21/2022 CLINICAL DATA:  Pain, vomiting, diarrhea EXAM: CT ABDOMEN AND PELVIS WITH CONTRAST TECHNIQUE: Multidetector CT imaging of the abdomen and pelvis was performed using the standard protocol following bolus administration of intravenous contrast. RADIATION DOSE REDUCTION: This exam was performed according to the departmental dose-optimization program which includes automated exposure control, adjustment of the mA and/or kV according to patient size and/or use of iterative reconstruction technique. CONTRAST:  100 mL OMNIPAQUE IOHEXOL 300 MG/ML  SOLN COMPARISON:  09/18/2022 FINDINGS: Lower chest: No acute abnormality. No pleural or pericardial effusion. Hepatobiliary: No focal liver abnormality is seen. No gallstones, gallbladder wall thickening, or biliary dilatation. Pancreas: Unremarkable. No pancreatic ductal dilatation or surrounding inflammatory changes. Spleen: Normal in size without focal abnormality. Adrenals/Urinary Tract: Adrenal glands are unremarkable. Kidneys are normal, without renal calculi, focal lesion, or hydronephrosis. Bladder is  unremarkable. Stomach/Bowel: Stomach is within normal limits. Appendix appears normal. No evidence of bowel wall thickening, distention, or inflammatory changes. Vascular/Lymphatic: No significant vascular findings are present. No enlarged abdominal or pelvic lymph nodes. Reproductive: Uterus and bilateral adnexa are unremarkable. Other: No abdominal wall hernia or abnormality. No abdominopelvic ascites. Musculoskeletal: No acute or significant osseous findings. IMPRESSION: No acute abdominal or pelvic pathology identified. Electronically Signed   By: Sammie Bench M.D.   On: 12/21/2022 08:15    Procedures Procedures  Remain on constant cardiac monitoring, sinus tachycardia.  Medications Ordered in ED Medications  droperidol (INAPSINE) 2.5 MG/ML injection 1.25 mg (1.25  mg Intravenous Given 12/21/22 0738)  HYDROmorphone (DILAUDID) injection 1 mg (1 mg Intravenous Given 12/21/22 0738)  lactated ringers bolus 500 mL (0 mLs Intravenous Stopped 12/21/22 0842)  iohexol (OMNIPAQUE) 300 MG/ML solution 100 mL (100 mLs Intravenous Contrast Given 12/21/22 0757)    ED Course/ Medical Decision Making/ A&P                             Medical Decision Making MEGANN BARTUNEK is a 47 y.o. female.  With PMH of COPD, DM, HTN, GERD, chronic pain who presents with diffuse stabbing abdominal pain associate with nausea and vomiting and loose stools.   In the setting of known diabetes diffuse nonlocalizing pain vomiting and diarrhea, differential is broad but includes and is not limited to acute gastroenteritis versus gastroparesis versus pancreatitis versus possible bowel obstruction although less likely with no history of surgeries versus ileus versus opioid withdrawal in the setting of chronic opioid use.  Patient's workup reviewed by me.  Slightly elevated creatinine 1.66 given IV fluids today.  Mild leukocytosis 13.2 but appears to have chronic leukocytosis.  Normal platelet and hemoglobin.  No acute electrolyte  abnormalities.  Lipase unremarkable no transaminitis.  Pregnancy test negative.  UA without UTI.  CTAP with IV contrast obtained which I personally reviewed, no evidence of bowel obstruction on my review.  No acute findings per radiology read.  Patient's pain improved after medications given today tolerating p.o.  Advise close follow-up with GI/PCP.  Suggested possible influence of diabetic gastroparesis and possible narcotic use leading to withdrawal versus ileus.  Started patient on bowel regimen and prescribed Bentyl.  She has Reglan at home.  She is safe for discharge.  Return precautions discussed.  Amount and/or Complexity of Data Reviewed Labs: ordered. Radiology: ordered.  Risk OTC drugs. Prescription drug management.    Final Clinical Impression(s) / ED Diagnoses Final diagnoses:  Generalized abdominal pain  Nausea and vomiting, unspecified vomiting type    Rx / DC Orders ED Discharge Orders          Ordered    dicyclomine (BENTYL) 20 MG tablet  2 times daily PRN        12/21/22 0849    polyethylene glycol (MIRALAX) 17 g packet  Daily        12/21/22 0849    senna-docusate (SENOKOT-S) 8.6-50 MG tablet  Daily        12/21/22 0849              Elgie Congo, MD 12/21/22 [phone]

## 2022-12-21 NOTE — Telephone Encounter (Signed)
Unsuccessful attempt to reach patient on preferred number listed in notes for scheduled AWV. Unable to leave voicemail.

## 2022-12-21 NOTE — Discharge Instructions (Signed)
  You have been seen in the Emergency Department (ED) for abdominal pain. Your workup was generally reassuring; however, it did not identify a clear cause of your symptoms.  As we discussed some of your symptoms could be related to diabetic gastroparesis.  I have included [facility] information in your discharge paperwork so you can call to make a follow-up appointment for further workup and management.  Please follow up with your primary care doctor as soon as possible regarding today's ED visit and the symptoms that are bothering you.  Return to the ED if your abdominal pain worsens or fails to improve, you develop bloody vomiting, bloody diarrhea, you are unable to tolerate fluids due to vomiting, fever greater than 101, or any other concerning symptoms.

## 2022-12-21 NOTE — ED Triage Notes (Signed)
Patient arrives to ED POV c/o Abdominal pain, N//V/D. Pt was seen and discharged today for same and was prescribed Zofran. Pt states zofran is not working and that she is still in pain.

## 2022-12-21 NOTE — ED Provider Notes (Signed)
[facility]  Provider Note  CSN: [id number] Arrival date & time: 12/21/22 2031  History Chief Complaint  Patient presents with   Abdominal Pain    Jill Shaw is a 47 y.o. female with history of DM, chronic abdominal pain and frequent ED visits for exacerbations seen in the ED earlier today for same, had a negative lab workup and CT, given droperidol and dilaudid with improvement. Reports she felt well for a few hours then pain came back. She denies any marijuana use. She has oxycodone and reglan at home.    Home Medications Prior to Admission medications   Medication Sig Start Date End Date Taking? Authorizing Provider  Accu-Chek Softclix Lancets lancets USE TO TEST AS DIRECTED UP TO FOUR TIMES DAILY. 03/06/22   Shamleffer, Melanie Crazier, MD  albuterol (PROVENTIL) (2.5 MG/3ML) 0.083% nebulizer solution Inhale into the lungs. 09/27/20   [provider]  albuterol (VENTOLIN HFA) 108 (90 Base) MCG/ACT inhaler Inhale 2 puffs into the lungs every 6 (six) hours as needed for wheezing or shortness of breath. 07/18/21   Margaretha Seeds, MD  B-D ULTRAFINE III SHORT PEN 31G X 8 MM MISC USE AS DIRECTED EVERY MORNING, NOON, EVENING, AND AT BEDTIME 07/10/22   Shamleffer, Melanie Crazier, MD  baclofen (LIORESAL) 10 MG tablet Take 1 tablet (10 mg total) by mouth 3 (three) times daily as needed for muscle spasms. 06/21/22   Raulkar, Clide Deutscher, MD  cetirizine (ZYRTEC) 10 MG tablet Take 1 tablet by mouth daily. 06/07/21   [provider]  Continuous Blood Gluc Sensor (FREESTYLE LIBRE 3 SENSOR) MISC Place 1 sensor on the skin every 14 days. Use to check glucose continuously 07/06/22   Fenton Foy, NP  Dexlansoprazole 30 MG capsule DR Take 1 capsule (30 mg total) by mouth daily. NEEDS OFFICE VISIT FOR ADDITIONAL REFILLS 12/15/22   Fenton Foy, NP  diclofenac Sodium (VOLTAREN) 1 % GEL APPLY 2 GRAMS TOPICALLY TO THE AFFECTED AREA FOUR TIMES  DAILY 03/05/22   Sherilyn Cooter, MD  dicyclomine (BENTYL) 20 MG tablet Take 1 tablet (20 mg total) by mouth 2 (two) times daily. 01/20/22   Margarita Mail, PA-C  dicyclomine (BENTYL) 20 MG tablet Take 1 tablet (20 mg total) by mouth 2 (two) times daily as needed (abdominal cramping). 12/21/22   Elgie Congo, MD  doxycycline (VIBRAMYCIN) 50 MG capsule Take 50 mg by mouth daily. 07/11/22   [provider]  empagliflozin (JARDIANCE) 25 MG TABS tablet Take 1 tablet (25 mg total) by mouth daily before breakfast. 06/27/21   Shamleffer, Melanie Crazier, MD  famotidine (PEPCID) 40 MG tablet Take 1 tablet (40 mg total) by mouth every evening. 12/05/22   Fenton Foy, NP  fluticasone (FLONASE) 50 MCG/ACT nasal spray Place 2 sprays into both nostrils daily. 12/02/20   Vevelyn Francois, NP  furosemide (LASIX) 40 MG tablet TAKE 1 TABLET(40 MG) BY MOUTH TWICE DAILY 10/02/22   Patwardhan, Manish J, MD  glucose blood (ACCU-CHEK GUIDE) test strip USE TO TEST BLOOD GLUCOSE AS DIRECTED UP TO 4 TIMES DAILY 11/14/22   Fenton Foy, NP  haloperidol (HALDOL) 5 MG tablet Take 1 tablet (5 mg total) by mouth every 8 (eight) hours as needed (For nausea/vomiting). 03/31/22   Molpus, John, MD  HUMALOG KWIKPEN 200 UNIT/ML KwikPen Inject 50 Units into the skin 3 (three) times daily before meals. 07/06/22   Fenton Foy, NP  hydrochlorothiazide (MICROZIDE) 12.5 MG  capsule TAKE 1 CAPSULE(12.5 MG) BY MOUTH DAILY Patient taking differently: Take 12.5 mg by mouth daily. 12/14/21   Cantwell, Celeste C, PA-C  hydrOXYzine (ATARAX) 10 MG tablet Take 10 mg by mouth at bedtime. 04/26/22   [provider]  insulin glargine, 2 Unit Dial, (TOUJEO MAX SOLOSTAR) 300 UNIT/ML Solostar Pen Inject 140 Units into the skin daily. 07/06/22   Fenton Foy, NP  isosorbide mononitrate (IMDUR) 30 MG 24 hr tablet TAKE 1 TABLET(30 MG) BY MOUTH DAILY 03/23/22   Patwardhan, Manish J, MD  losartan (COZAAR) 100 MG tablet TAKE 1 TABLET BY  MOUTH EVERY DAY 11/01/22   Patwardhan, Reynold Bowen, MD  megestrol (MEGACE) 40 MG tablet Take two tablets three times daily for 3 days, then two tablets twice daily 10/23/22   Darliss Cheney, MD  metoprolol succinate (TOPROL-XL) 100 MG 24 hr tablet Take 100 mg by mouth daily. Take with or immediately following a meal.    [provider]  ondansetron (ZOFRAN-ODT) 4 MG disintegrating tablet Take 1 tablet (4 mg total) by mouth every 8 (eight) hours as needed for nausea or vomiting. 12/06/22   Fenton Foy, NP  oxyCODONE-acetaminophen (PERCOCET) 10-325 MG tablet Take 1 tablet by mouth every 4 (four) hours as needed for pain. 12/20/22   Raulkar, Clide Deutscher, MD  Pimozide 1 MG TABS Take by mouth. 06/13/22   [provider]  polyethylene glycol (MIRALAX) 17 g packet Take 17 g by mouth daily. 12/21/22   Elgie Congo, MD  potassium chloride SA (KLOR-CON M) 20 MEQ tablet Take 1 tablet (20 mEq total) by mouth daily. 12/20/22   Fenton Foy, NP  rosuvastatin (CRESTOR) 20 MG tablet TAKE 1 TABLET(20 MG) BY MOUTH DAILY 10/31/22   Patwardhan, Manish J, MD  senna-docusate (SENOKOT-S) 8.6-50 MG tablet Take 1 tablet by mouth daily. 12/21/22 01/20/23  Elgie Congo, MD  SPIRIVA RESPIMAT 2.5 MCG/ACT AERS INHALE 2 PUFFS INTO THE LUNGS DAILY 02/03/22   Icard, Octavio Graves, DO  spironolactone (ALDACTONE) 50 MG tablet TAKE 1 TABLET(50 MG) BY MOUTH DAILY 12/22/20   Patwardhan, Manish J, MD  sucralfate (CARAFATE) 1 g tablet Take 1 tablet (1 g total) by mouth 4 (four) times daily -  with meals and at bedtime. 08/21/21   Isla Pence, MD  SYMBICORT 160-4.5 MCG/ACT inhaler INHALE 2 PUFFS INTO THE LUNGS TWICE DAILY 10/31/22   Icard, Octavio Graves, DO  tizanidine (ZANAFLEX) 6 MG capsule Take 1 capsule (6 mg total) by mouth 3 (three) times daily. 11/21/22   Raulkar, Clide Deutscher, MD  tretinoin (RETIN-A) 0.025 % cream Apply topically. 08/12/22   [provider]     Allergies    Elavil [amitriptyline], Orudis  [ketoprofen], Desyrel [trazodone], Aspirin, Motrin [ibuprofen], Naprosyn [naproxen], Neurontin [gabapentin], Sulfa antibiotics, Ultram [tramadol], Victoza [liraglutide], and Zegerid [omeprazole-sodium bicarbonate]   Review of Systems   Review of Systems Please see HPI for pertinent positives and negatives  Physical Exam BP (!) 170/90   Pulse (!) 107   Temp 98.5 F (36.9 C) (Oral)   Resp (!) 22   Ht 5\' 4"  (1.626 m)   Wt 113.4 kg   SpO2 98%   BMI 42.91 kg/m   Physical Exam Vitals and nursing note reviewed.  Constitutional:      Appearance: Normal appearance.  HENT:     Head: Normocephalic and atraumatic.     Nose: Nose normal.     Mouth/Throat:     Mouth: Mucous membranes are moist.  Eyes:     Extraocular Movements: Extraocular movements intact.     Conjunctiva/sclera: Conjunctivae normal.  Cardiovascular:     Rate and Rhythm: Tachycardia present.  Pulmonary:     Effort: Pulmonary effort is normal.     Breath sounds: Normal breath sounds.  Abdominal:     General: Abdomen is protuberant.     Tenderness: There is abdominal tenderness (patient refused detailed exam, pushed my hand away).  Musculoskeletal:        General: No swelling. Normal range of motion.     Cervical back: Neck supple.  Skin:    General: Skin is warm and dry.  Neurological:     General: No focal deficit present.     Mental Status: She is alert.  Psychiatric:        Mood and Affect: Mood normal.     ED Results / Procedures / Treatments   EKG EKG Interpretation  Date/Time:  Thursday December 21 2022 22:13:45 EST Ventricular Rate:  128 PR Interval:  130 QRS Duration: 80 QT Interval:  295 QTC Calculation: 431 R Axis:   59 Text Interpretation: Sinus tachycardia Anteroseptal infarct, old Borderline T abnormalities, inferior leads Baseline wander in lead(s) III a No significant change since last tracing Confirmed by Calvert Cantor [phone]) on 12/21/2022 11:26:38  PM  Procedures Procedures  Medications Ordered in the ED Medications  lactated ringers bolus 1,000 mL (0 mLs Intravenous Stopped 12/22/22 0031)  droperidol (INAPSINE) 2.5 MG/ML injection 2.5 mg (2.5 mg Intravenous Given 12/21/22 2344)  lactated ringers bolus 1,000 mL (0 mLs Intravenous Stopped 12/22/22 0114)  metoCLOPramide (REGLAN) injection 10 mg (10 mg Intravenous Given 12/22/22 0112)    Initial Impression and Plan  Patient here for re-evaluation of exacerbation of chronic abdominal pain, vomiting. Has been retching but empty emesis bag at the time of my evaluation. Will give IVF, droperidol for symptom control. Labs ordered in triage are pending.   ED Course   Clinical Course as of 12/22/22 0259  Fri Dec 22, 2022  0001 BMP with increased Cr from earlier in the day but not doubled from baseline. Some acidosis likely from vomiting but also consider euglycemic DKA given Jardiance use. Will add VBG. Give additional IVF and plan recheck of BMP after second liter. Trop is normal. [CS]  0025 VBG without acidosis. K was elevated but suspect this is a lab error given normal K on BMP.  [CS]  2778 Repeat BMP with improved Cr, anion gap is closed. Repeat Trop is not significantly changed. No symptoms concerning for ACS, this was ordered from triage. She has had improved symptoms no further vomiting.HR improved. She has been repeatedly requesting dilaudid. I advised that this is not indicated for ED treatment of her chronic condition and that given her improved labs and symptoms she does not meet criteria for inpatient admission. She can continue her chronic pain and nausea medications at home. PCP follow up.  [CS]    Clinical Course User Index [CS] Truddie Hidden, MD     MDM Rules/Calculators/A&P Medical Decision Making Problems Addressed: Chronic abdominal pain: chronic illness or injury with exacerbation, progression, or side effects of treatment Cyclical vomiting: chronic illness or injury  with exacerbation, progression, or side effects of treatment Dehydration: acute illness or injury  Amount and/or Complexity of Data Reviewed Labs: ordered. Decision-making details documented in ED Course. ECG/medicine tests: ordered and independent interpretation performed. Decision-making details documented in ED Course.  Risk Prescription drug management. Decision regarding hospitalization.  Final Clinical Impression(s) / ED Diagnoses Final diagnoses:  Chronic abdominal pain  Cyclical vomiting  Dehydration    Rx / DC Orders ED Discharge Orders     None        Truddie Hidden, MD 12/22/22 [phone]

## 2022-12-22 ENCOUNTER — Other Ambulatory Visit: Payer: Self-pay

## 2022-12-22 DIAGNOSIS — R1084 Generalized abdominal pain: Secondary | ICD-10-CM | POA: Diagnosis not present

## 2022-12-22 LAB — I-STAT VENOUS BLOOD GAS, ED
Acid-Base Excess: 1 mmol/L (ref 0.0–2.0)
Bicarbonate: 23.8 mmol/L (ref 20.0–28.0)
Calcium, Ion: 1.05 mmol/L — ABNORMAL LOW (ref 1.15–1.40)
HCT: 45 % (ref 36.0–46.0)
Hemoglobin: 15.3 g/dL — ABNORMAL HIGH (ref 12.0–15.0)
O2 Saturation: 70 %
Patient temperature: 98.5
Potassium: 7.8 mmol/L (ref 3.5–5.1)
Sodium: 134 mmol/L — ABNORMAL LOW (ref 135–145)
TCO2: 25 mmol/L (ref 22–32)
pCO2, Ven: 33.3 mmHg — ABNORMAL LOW (ref 44–60)
pH, Ven: 7.463 — ABNORMAL HIGH (ref 7.25–7.43)
pO2, Ven: 34 mmHg (ref 32–45)

## 2022-12-22 LAB — CBC
HCT: 40.4 % (ref 36.0–46.0)
Hemoglobin: 13.7 g/dL (ref 12.0–15.0)
MCH: 27 pg (ref 26.0–34.0)
MCHC: 33.9 g/dL (ref 30.0–36.0)
MCV: 79.5 fL — ABNORMAL LOW (ref 80.0–100.0)
Platelets: 289 10*3/uL (ref 150–400)
RBC: 5.08 MIL/uL (ref 3.87–5.11)
RDW: 16.4 % — ABNORMAL HIGH (ref 11.5–15.5)
WBC: 12.2 10*3/uL — ABNORMAL HIGH (ref 4.0–10.5)
nRBC: 0 % (ref 0.0–0.2)

## 2022-12-22 LAB — BASIC METABOLIC PANEL
Anion gap: 15 (ref 5–15)
BUN: 15 mg/dL (ref 6–20)
CO2: 20 mmol/L — ABNORMAL LOW (ref 22–32)
Calcium: 9.7 mg/dL (ref 8.9–10.3)
Chloride: 104 mmol/L (ref 98–111)
Creatinine, Ser: 1.63 mg/dL — ABNORMAL HIGH (ref 0.44–1.00)
GFR, Estimated: 39 mL/min — ABNORMAL LOW (ref >60)
Glucose, Bld: 174 mg/dL — ABNORMAL HIGH (ref 70–99)
Potassium: 4.8 mmol/L (ref 3.5–5.1)
Sodium: 139 mmol/L (ref 135–145)

## 2022-12-22 LAB — TROPONIN I (HIGH SENSITIVITY): Troponin I (High Sensitivity): 20 ng/L — ABNORMAL HIGH (ref <18)

## 2022-12-22 MED ORDER — LACTATED RINGERS IV BOLUS
1000.0000 mL | Freq: Once | INTRAVENOUS | Status: AC
  Administered 2022-12-22: 1000 mL via INTRAVENOUS

## 2022-12-22 MED ORDER — METOCLOPRAMIDE HCL 5 MG/ML IJ SOLN
10.0000 mg | Freq: Once | INTRAMUSCULAR | Status: AC
  Administered 2022-12-22: 10 mg via INTRAVENOUS
  Filled 2022-12-22: qty 2

## 2022-12-22 NOTE — ED Notes (Signed)
RT ran VBG w/the following results and critical values. MD Karle Starch aware of results and critical values if any.    Latest Reference Range & Units 12/22/22 00:17  Sample type  VENOUS  pH, Ven 7.25 - 7.43  7.463 (H)  pCO2, Ven 44 - 60 mmHg 33.3 (L)  pO2, Ven 32 - 45 mmHg 34  TCO2 22 - 32 mmol/L 25  Acid-Base Excess 0.0 - 2.0 mmol/L 1.0  Bicarbonate 20.0 - 28.0 mmol/L 23.8  O2 Saturation % 70  Patient temperature  98.5 F  Collection site  IV start  (H): Data is abnormally high (L): Data is abnormally low

## 2022-12-22 NOTE — ED Notes (Signed)
Pt verbalized understanding of d/c instructions, meds, and followup care. Denies questions. VSS, no distress noted. Steady gait to exit with all belongings.  ?

## 2022-12-22 NOTE — ED Notes (Addendum)
Ambulates to restroom on own ability. Steady on feet, though slow. Pt reports she is feeling a little better, no longer dry heaving. Pt reports she would like dilaudid as 'that usually helps when I come here.' Karle Starch MD notified.

## 2022-12-25 ENCOUNTER — Other Ambulatory Visit: Payer: Self-pay | Admitting: Cardiology

## 2022-12-25 ENCOUNTER — Telehealth: Payer: Self-pay

## 2022-12-25 ENCOUNTER — Encounter (HOSPITAL_BASED_OUTPATIENT_CLINIC_OR_DEPARTMENT_OTHER): Payer: Medicare Other | Admitting: Registered Nurse

## 2022-12-25 ENCOUNTER — Other Ambulatory Visit: Payer: Self-pay | Admitting: Nurse Practitioner

## 2022-12-25 ENCOUNTER — Other Ambulatory Visit: Payer: Self-pay | Admitting: Internal Medicine

## 2022-12-25 ENCOUNTER — Encounter: Payer: Self-pay | Admitting: Registered Nurse

## 2022-12-25 VITALS — BP 128/81 | HR 95 | Ht 64.0 in | Wt 266.8 lb

## 2022-12-25 DIAGNOSIS — Z79891 Long term (current) use of opiate analgesic: Secondary | ICD-10-CM

## 2022-12-25 DIAGNOSIS — I10 Essential (primary) hypertension: Secondary | ICD-10-CM

## 2022-12-25 DIAGNOSIS — M5416 Radiculopathy, lumbar region: Secondary | ICD-10-CM

## 2022-12-25 DIAGNOSIS — R519 Headache, unspecified: Secondary | ICD-10-CM | POA: Diagnosis not present

## 2022-12-25 DIAGNOSIS — G4701 Insomnia due to medical condition: Secondary | ICD-10-CM | POA: Diagnosis not present

## 2022-12-25 DIAGNOSIS — G894 Chronic pain syndrome: Secondary | ICD-10-CM | POA: Diagnosis not present

## 2022-12-25 DIAGNOSIS — G629 Polyneuropathy, unspecified: Secondary | ICD-10-CM

## 2022-12-25 DIAGNOSIS — Z794 Long term (current) use of insulin: Secondary | ICD-10-CM

## 2022-12-25 DIAGNOSIS — Z5181 Encounter for therapeutic drug level monitoring: Secondary | ICD-10-CM

## 2022-12-25 NOTE — Patient Instructions (Signed)
Call Office or Send My- Chart message on 04/ 4th or the 5th  Regarding April prescription, your appointment with Dr Ranell Patrick is on 01/25/2023

## 2022-12-25 NOTE — Transitions of Care (Post Inpatient/ED Visit) (Signed)
   12/25/2022  Name: Jill Shaw MRN: [id number] DOB: [birth year]  Today's TOC FU Call Status: Today's TOC FU Call Status:: Unsuccessul Call (1st Attempt) Unsuccessful Call (1st Attempt) Date: 12/25/22  Attempted to reach the patient regarding the most recent Inpatient/ED visit.  Follow Up Plan: Additional outreach attempts will be made to reach the patient to complete the Transitions of Care (Post Inpatient/ED visit) call.   [place] RMA

## 2022-12-25 NOTE — Progress Notes (Signed)
Subjective:    Patient ID: KINJAL Shaw, female    DOB: Oct 13, 1976, 47 y.o.   MRN: [id number]  HPI: Jill Shaw is a 47 y.o. female who returns for follow up appointment for chronic pain and medication refill. She states her pain is located in her lower back radiating into her bilateral lower extremities and bilateral feet pain with tingling and burning. She also reports she awaken with a headache. She  rates her pain 9. Her current exercise regime is walking and performing stretching exercises.  [name] Morphine equivalent is 90.00 MME.   UDS ordered today.    Pain Inventory Average Pain 10 Pain Right Now 9 My pain is  na  In the last 24 hours, has pain interfered with the following? General activity 1 Relation with others 1 Enjoyment of life 1 What TIME of day is your pain at its worst? morning , daytime, evening, and night Sleep (in general) Poor  Pain is worse with: walking, bending, sitting, inactivity, standing, and some activites Pain improves with: heat/ice and medication Relief from Meds:  na  Family History  Problem Relation Age of Onset   Diabetes Mother    Hypertension Mother    Migraines Paternal Grandfather    Colon cancer Neg Hx    Esophageal cancer Neg Hx    Stomach cancer Neg Hx    Rectal cancer Neg Hx    Social History   Socioeconomic History   Marital status: Divorced    Spouse name: Not on file   Number of children: 3   Years of education: Not on file   Highest education level: Not on file  Occupational History   Occupation: unemployed  Tobacco Use   Smoking status: Some Days    Packs/day: 0.25    Years: 26.00    Total pack years: 6.50    Types: Cigarettes    Last attempt to quit: 09/13/2020    Years since quitting: 2.2   Smokeless tobacco: Never   Tobacco comments:    4-5 cigarettes/day  Vaping Use   Vaping Use: Never used  Substance and Sexual Activity   Alcohol use: No   Drug use: No   Sexual activity: Not Currently     Birth control/protection: None  Other Topics Concern   Not on file  Social History Narrative   Right Handed   Lives in a one story apartment, but lives on the second floor   Drinks caffeine once in awhile   Social Determinants of Health   Financial Resource Strain: Not on file  Food Insecurity: Food Insecurity Present (12/08/2021)   Hunger Vital Sign    Worried About Running Out of Food in the Last Year: Never true    Ran Out of Food in the Last Year: Sometimes true  Transportation Needs: No Transportation Needs (12/08/2021)   PRAPARE - Hydrologist (Medical): No    Lack of Transportation (Non-Medical): No  Physical Activity: Not on file  Stress: Not on file  Social Connections: Not on file   Past Surgical History:  Procedure Laterality Date   CESAREAN SECTION     x 1. for twins   DILATION AND CURETTAGE OF UTERUS N/A 08/20/2019   Procedure: DILATATION AND CURETTAGE;  Surgeon: Emily Filbert, MD;  Location: [place];  Service: Gynecology;  Laterality: N/A;   ENDOMETRIAL ABLATION N/A 08/20/2019   Procedure: Minerva Ablation;  Surgeon: Emily Filbert, MD;  Location: [place];  Service: Gynecology;  Laterality: N/A;   EYE SURGERY Bilateral    laser right and cataract removed left eye   LEFT HEART CATH AND CORONARY ANGIOGRAPHY N/A 08/30/2021   Procedure: LEFT HEART CATH AND CORONARY ANGIOGRAPHY;  Surgeon: Nigel Mormon, MD;  Location: [place] [facility];  Service: Cardiovascular;  Laterality: N/A;   RADIOLOGY WITH ANESTHESIA N/A 09/16/2019   Procedure: MRI WITH ANESTHESIA   L SPINE WITHOUT CONTRAST, T SPINE WITHOUT CONTRAST , CERVICAL WITHOUT CONTRAST;  Surgeon: Radiologist, Medication, MD;  Location: [place];  Service: Radiology;  Laterality: N/A;   TUBAL LIGATION     interval BTL   UPPER GI ENDOSCOPY  07/2017   Past Surgical History:  Procedure Laterality Date   CESAREAN SECTION     x 1. for twins   DILATION AND CURETTAGE OF UTERUS N/A 08/20/2019    Procedure: DILATATION AND CURETTAGE;  Surgeon: Emily Filbert, MD;  Location: [place];  Service: Gynecology;  Laterality: N/A;   ENDOMETRIAL ABLATION N/A 08/20/2019   Procedure: Minerva Ablation;  Surgeon: Emily Filbert, MD;  Location: [place];  Service: Gynecology;  Laterality: N/A;   EYE SURGERY Bilateral    laser right and cataract removed left eye   LEFT HEART CATH AND CORONARY ANGIOGRAPHY N/A 08/30/2021   Procedure: LEFT HEART CATH AND CORONARY ANGIOGRAPHY;  Surgeon: Nigel Mormon, MD;  Location: [place] [facility];  Service: Cardiovascular;  Laterality: N/A;   RADIOLOGY WITH ANESTHESIA N/A 09/16/2019   Procedure: MRI WITH ANESTHESIA   L SPINE WITHOUT CONTRAST, T SPINE WITHOUT CONTRAST , CERVICAL WITHOUT CONTRAST;  Surgeon: Radiologist, Medication, MD;  Location: [place];  Service: Radiology;  Laterality: N/A;   TUBAL LIGATION     interval BTL   UPPER GI ENDOSCOPY  07/2017   Past Medical History:  Diagnosis Date   Anemia    Anxiety    Arthritis    knees, hands   Asthma    Chronic diastolic (congestive) heart failure (HCC)    COPD (chronic obstructive pulmonary disease) (HCC)    Diabetes mellitus ([place])    DKA (diabetic ketoacidosis) (HCC)    Dysfunctional uterine bleeding    Gastritis    GERD (gastroesophageal reflux disease)    Hypertension    Intractable nausea and vomiting 03/23/2021   Neuromuscular disorder ([place])    neuropathy feet   Seizures ([place]) 09/12/2017   pt states r/t stress and blood sugar - no meds last one 4 months ago, not seen neurologist   Sickle cell trait ([place])    Smoker    Vitamin D deficiency 10/2019   Wears glasses    BP (!) 163/95   Pulse (!) 107   Ht 5\' 4"  (1.626 m)   Wt 266 lb 12.8 oz (121 kg)   SpO2 97%   BMI 45.80 kg/m   Opioid Risk Score:   Fall Risk Score:  `1  Depression screen [facility] 2/9     12/25/2022   10:18 AM 10/24/2022   10:03 AM 09/05/2022    2:45 PM 07/17/2022   11:04 AM 07/04/2022    8:58 AM 06/01/2022    9:05 AM 05/03/2022     1:52 PM  Depression screen PHQ 2/9  Decreased Interest 2 0 0 0 0 0 0  Down, Depressed, Hopeless 2 0 0 0 0 0 0  PHQ - 2 Score 4 0 0 0 0 0 0  Altered sleeping   2      Tired, decreased energy  2      Change in appetite   0      Feeling bad or failure about yourself    0      Trouble concentrating   0      Moving slowly or fidgety/restless   0      Suicidal thoughts   0      PHQ-9 Score   4        Review of Systems  Constitutional: Negative.   HENT: Negative.    Eyes: Negative.   Respiratory: Negative.    Cardiovascular: Negative.   Gastrointestinal: Negative.   Endocrine: Negative.   Genitourinary: Negative.   Musculoskeletal:  Positive for arthralgias and back pain.  Skin: Negative.   Allergic/Immunologic: Negative.   Neurological: Negative.   Hematological: Negative.   Psychiatric/Behavioral: Negative.    All other systems reviewed and are negative.      Objective:   Physical Exam Vitals and nursing note reviewed.  Constitutional:      Appearance: Normal appearance.  Neck:     Comments: Cervical Paraspinal Tenderness: C-5-C-6 Cardiovascular:     Rate and Rhythm: Normal rate and regular rhythm.     Pulses: Normal pulses.     Heart sounds: Normal heart sounds.  Pulmonary:     Effort: Pulmonary effort is normal.     Breath sounds: Normal breath sounds.  Musculoskeletal:     Cervical back: Normal range of motion and neck supple.     Comments: Normal Muscle Bulk and Muscle Testing Reveals:  Upper Extremities: Full ROM and Muscle Strength 5/5 Bilateral AC Joint Tenderness Lumbar Paraspinal Tenderness: L-4-L-5 Lower Extremities: Right: Decreased ROM and Muscle Strength 5/5 Right Lower Extremity Flexion Produces Pain into her Patella Left Lower Extremity: Full ROM and Muscle Strength 5/5 Arises from Table slowly Narrow Based Gait     Skin:    General: Skin is warm and dry.  Neurological:     Mental Status: She is alert and oriented to person, place, and time.   Psychiatric:        Mood and Affect: Mood normal.        Behavior: Behavior normal.         Assessment & Plan:  1. Lumbar Radiculitis: Continue current medication regimen. Continue HEP as Tolerated.12/25/2022 2. Fibromyalgia/Polyneuropathy: Continue HEP as Tolerated. Continue current Medication regimen. Continue to Monitor. 12/25/2022 3. Bilateral Knee Pain: R>L. Continue HEP as tolerated. Continue to Monitor.03/1\10/2022 4. Chronic Pain Syndrome: Continue Oxycodone 10/325mg  one tablet 6 times  a day as needed for pain #180. Pharmacy was called: to remove Oxycodone prescription sent on 11/27/2022. According to PMP Oxycodone was filled on 11/24/2022. Pharmacist verbalized understanding. We will continue the opioid monitoring program, this consists of regular clinic visits, examinations, urine drug screen, pill counts as well as use of [place] Controlled Substance Reporting system. A 12 month History has been reviewed on the [place] on 03/112024.  5.  Muscle Spasm: Continue Tizanidine: Continue to Monitor . 12/25/2022   F/U in 1 month

## 2022-12-26 ENCOUNTER — Other Ambulatory Visit: Payer: Self-pay

## 2022-12-26 ENCOUNTER — Telehealth: Payer: Self-pay | Admitting: Nurse Practitioner

## 2022-12-26 DIAGNOSIS — E119 Type 2 diabetes mellitus without complications: Secondary | ICD-10-CM

## 2022-12-26 NOTE — Telephone Encounter (Signed)
Patient LVM on nurse line 12/25/22 asking if she has ever been prescribed Ozempic. Please call or send message through [place].

## 2022-12-27 ENCOUNTER — Other Ambulatory Visit: Payer: Self-pay | Admitting: Nurse Practitioner

## 2022-12-27 ENCOUNTER — Other Ambulatory Visit: Payer: Self-pay

## 2022-12-27 DIAGNOSIS — Z794 Long term (current) use of insulin: Secondary | ICD-10-CM

## 2022-12-27 MED ORDER — HUMALOG KWIKPEN 200 UNIT/ML ~~LOC~~ SOPN
50.0000 [IU] | PEN_INJECTOR | Freq: Three times a day (TID) | SUBCUTANEOUS | 0 refills | Status: DC
  Filled 2022-12-27: qty 60, 40d supply, fill #0

## 2022-12-27 MED ORDER — HUMALOG KWIKPEN 200 UNIT/ML ~~LOC~~ SOPN
50.0000 [IU] | PEN_INJECTOR | Freq: Three times a day (TID) | SUBCUTANEOUS | 2 refills | Status: DC
  Filled 2022-12-27: qty 60, 40d supply, fill #0

## 2022-12-27 MED ORDER — HUMALOG KWIKPEN 200 UNIT/ML ~~LOC~~ SOPN
50.0000 [IU] | PEN_INJECTOR | Freq: Three times a day (TID) | SUBCUTANEOUS | 0 refills | Status: DC
  Filled 2022-12-27: qty 60, 80d supply, fill #0

## 2022-12-27 NOTE — Telephone Encounter (Signed)
Pt have an apt on the 21 recently discharged from the hospital and she is requesting refill on insulin. Please advice. Thank you.

## 2022-12-27 NOTE — Telephone Encounter (Signed)
Caller & Relationship to patient:  MRN #  [id number]   Call Back Number:   Date of Last Office Visit: 12/19/2022     Date of Next Office Visit: 12/25/2022    Medication(s) to be Refilled: Insulin  Preferred Pharmacy:   ** Please notify patient to allow 48-72 hours to process** **Let patient know to contact pharmacy at the end of the day to make sure medication is ready. ** **If patient has not been seen in a year or longer, book an appointment **Advise to use MyChart for refill requests OR to contact their pharmacy

## 2022-12-28 ENCOUNTER — Other Ambulatory Visit: Payer: Self-pay

## 2022-12-28 LAB — TOXASSURE SELECT,+ANTIDEPR,UR

## 2022-12-28 NOTE — Transitions of Care (Post Inpatient/ED Visit) (Signed)
   12/28/2022  Name: Jill Shaw MRN: [id number] DOB: 19-Feb-1976  Today's TOC FU Call Status:    Attempted to reach the patient regarding the most recent Inpatient/ED visit.  Follow Up Plan: Additional outreach attempts will be made to reach the patient to complete the Transitions of Care (Post Inpatient/ED visit) call.  Pt is having an apt on 01/04/23. [employer].

## 2022-12-28 NOTE — Telephone Encounter (Signed)
Per provider medication sent to pt pharmacy. Jill Shaw

## 2022-12-29 ENCOUNTER — Telehealth: Payer: Self-pay

## 2022-12-29 ENCOUNTER — Encounter: Payer: Self-pay | Admitting: Physical Medicine and Rehabilitation

## 2022-12-29 NOTE — Transitions of Care (Post Inpatient/ED Visit) (Signed)
   12/29/2022  Name: MAIZE LINEBERGER MRN: [id number] DOB: 30-Sep-1976  Today's TOC FU Call Status: Today's TOC FU Call Status:: Unsuccessful Call (2nd Attempt) Unsuccessful Call (2nd Attempt) Date: 12/29/22  Attempted to reach the patient regarding the most recent Inpatient/ED visit.  Follow Up Plan: Additional outreach attempts will be made to reach the patient to complete the Transitions of Care (Post Inpatient/ED visit) call.   [place] RMA

## 2023-01-01 ENCOUNTER — Encounter: Payer: Self-pay | Admitting: Physical Medicine and Rehabilitation

## 2023-01-01 ENCOUNTER — Other Ambulatory Visit: Payer: Self-pay

## 2023-01-01 ENCOUNTER — Telehealth: Payer: Self-pay

## 2023-01-01 ENCOUNTER — Other Ambulatory Visit: Payer: Self-pay | Admitting: Physical Medicine and Rehabilitation

## 2023-01-01 MED ORDER — TIZANIDINE HCL 4 MG PO TABS
4.0000 mg | ORAL_TABLET | Freq: Four times a day (QID) | ORAL | 0 refills | Status: DC | PRN
  Filled 2023-01-01: qty 30, 8d supply, fill #0

## 2023-01-01 NOTE — Transitions of Care (Post Inpatient/ED Visit) (Signed)
   01/01/2023  Name: AVEONA GREENLIEF MRN: [id number] DOB: June 23, 1976  Today's TOC FU Call Status: Unsuccessful Call (3rd Attempt) Date: 01/01/23  Attempted to reach the patient regarding the most recent Inpatient/ED visit.  Follow Up Plan: No further outreach attempts will be made at this time. We have been unable to contact the patient.  [place] RMA

## 2023-01-02 ENCOUNTER — Other Ambulatory Visit: Payer: Self-pay

## 2023-01-03 ENCOUNTER — Telehealth: Payer: Self-pay | Admitting: *Deleted

## 2023-01-03 ENCOUNTER — Other Ambulatory Visit: Payer: Self-pay

## 2023-01-03 NOTE — Telephone Encounter (Signed)
Urine drug screen for this encounter is consistent for prescribed medication 

## 2023-01-04 ENCOUNTER — Encounter: Payer: Self-pay | Admitting: Nurse Practitioner

## 2023-01-04 ENCOUNTER — Ambulatory Visit (INDEPENDENT_AMBULATORY_CARE_PROVIDER_SITE_OTHER): Payer: Medicare Other | Admitting: Nurse Practitioner

## 2023-01-04 VITALS — BP 116/62 | HR 75 | Temp 97.4°F | Ht 64.0 in | Wt 279.0 lb

## 2023-01-04 DIAGNOSIS — E119 Type 2 diabetes mellitus without complications: Secondary | ICD-10-CM

## 2023-01-04 DIAGNOSIS — Z794 Long term (current) use of insulin: Secondary | ICD-10-CM | POA: Diagnosis not present

## 2023-01-04 LAB — POCT GLYCOSYLATED HEMOGLOBIN (HGB A1C): Hemoglobin A1C: 7 % — AB (ref 4.0–5.6)

## 2023-01-04 NOTE — Patient Instructions (Signed)
1. Controlled type 2 diabetes mellitus without complication, with long-term current use of insulin ([place])  - Ambulatory referral to Ophthalmology - POCT glycosylated hemoglobin (Hb A1C) - AMB Referral to Pharmacy Medication Management - CBC - Comprehensive metabolic panel  Follow up:  Follow up in 3 months or sooner if needed

## 2023-01-04 NOTE — Progress Notes (Signed)
@Patient  ID: Jill Shaw, female    DOB: [birth year], 47 y.o.   MRN: [id number]  Chief Complaint  Patient presents with   Follow-up    Referring provider: Fenton Foy, NP   HPI  Jill Shaw presents for follow up. She  has a past medical history of Anemia, Arthritis, Asthma, Chronic diastolic (congestive) heart failure ([place]), COPD (chronic obstructive pulmonary disease) ([place]), Diabetes mellitus without complication ([place]), DKA (diabetic ketoacidosis) ([place]) (10/15/2020), Dysfunctional uterine bleeding, GERD (gastroesophageal reflux disease), Hypertension, Neuromuscular disorder ([place]), Seizures ([place]) (09/12/2017), Sickle cell trait ([place]), Smoker, Vitamin D deficiency (10/2019), and Wears glasses.    Patient presents today for a diabetic follow-up.  She has been lost to follow-up since/June.  Apparently patient's medications have been changed since that time but she does not know who changed them.  She is currently taking Humalog once in the morning and another insulin that she thinks is Lantus but she is not sure.  Appears that patient is noncompliant with medications.  A1c in office today is 7.0.  We will have her consult with pharmacy for diabetic medication management and med reconciliation.  We advised her for future visits to bring all of her medications with her to the visit. Denies f/c/s, n/v/d, hemoptysis, PND, leg swelling Denies chest pain or edema       Allergies  Allergen Reactions   Elavil [Amitriptyline] Other (See Comments)    Coma   Orudis [Ketoprofen] Nausea And Vomiting   Desyrel [Trazodone] Nausea And Vomiting   Aspirin Nausea Only   Motrin [Ibuprofen] Nausea And Vomiting   Naprosyn [Naproxen] Nausea And Vomiting   Neurontin [Gabapentin] Nausea And Vomiting and Other (See Comments)    upset stomach   Sulfa Antibiotics Nausea And Vomiting   Ultram [Tramadol] Nausea And Vomiting and Other (See Comments)    stomach upset   Victoza [Liraglutide] Nausea  And Vomiting   Zegerid [Omeprazole-Sodium Bicarbonate] Nausea And Vomiting    Immunization History  Administered Date(s) Administered   Influenza,inj,Quad PF,6+ Mos 09/03/2018, 07/03/2019, 07/04/2020, 07/01/2021   Pneumococcal Polysaccharide-23 03/28/2015, 07/04/2020   Tdap 03/27/2016   Unspecified SARS-COV-2 Vaccination 03/25/2020, 05/05/2020    Past Medical History:  Diagnosis Date   Anemia    Anxiety    Arthritis    knees, hands   Asthma    Chronic diastolic (congestive) heart failure (HCC)    COPD (chronic obstructive pulmonary disease) ([place])    Diabetes mellitus ([place])    DKA (diabetic ketoacidosis) ([place])    Dysfunctional uterine bleeding    Gastritis    GERD (gastroesophageal reflux disease)    Hypertension    Intractable nausea and vomiting 03/23/2021   Neuromuscular disorder ([place])    neuropathy feet   Seizures ([place]) 09/12/2017   pt states r/t stress and blood sugar - no meds last one 4 months ago, not seen neurologist   Sickle cell trait ([place])    Smoker    Vitamin D deficiency 10/2019   Wears glasses     Tobacco History: Social History   Tobacco Use  Smoking Status Some Days   Packs/day: 0.25   Years: 26.00   Additional pack years: 0.00   Total pack years: 6.50   Types: Cigarettes   Last attempt to quit: 09/13/2020   Years since quitting: 2.3  Smokeless Tobacco Never  Tobacco Comments   4-5 cigarettes/day   Ready to quit: Not Answered Counseling given: Not Answered Tobacco comments: 4-5 cigarettes/day   Outpatient Encounter Medications as  of 01/04/2023  Medication Sig   Accu-Chek Softclix Lancets lancets USE TO TEST AS DIRECTED UP TO FOUR TIMES DAILY.   albuterol (PROVENTIL) (2.5 MG/3ML) 0.083% nebulizer solution Inhale into the lungs.   albuterol (VENTOLIN HFA) 108 (90 Base) MCG/ACT inhaler Inhale 2 puffs into the lungs every 6 (six) hours as needed for wheezing or shortness of breath.   B-D ULTRAFINE III SHORT PEN 31G X 8 MM MISC USE AS DIRECTED  EVERY MORNING, NOON, EVENING, AND AT BEDTIME   baclofen (LIORESAL) 10 MG tablet Take 1 tablet (10 mg total) by mouth 3 (three) times daily as needed for muscle spasms.   cetirizine (ZYRTEC) 10 MG tablet Take 1 tablet by mouth daily.   Continuous Blood Gluc Sensor (FREESTYLE LIBRE 3 SENSOR) MISC Place 1 sensor on the skin every 14 days. Use to check glucose continuously   Dexlansoprazole 30 MG capsule DR Take 1 capsule (30 mg total) by mouth daily. NEEDS OFFICE VISIT FOR ADDITIONAL REFILLS   diclofenac Sodium (VOLTAREN) 1 % GEL APPLY 2 GRAMS TOPICALLY TO THE AFFECTED AREA FOUR TIMES DAILY   dicyclomine (BENTYL) 20 MG tablet Take 1 tablet (20 mg total) by mouth 2 (two) times daily.   famotidine (PEPCID) 40 MG tablet Take 1 tablet (40 mg total) by mouth every evening.   furosemide (LASIX) 40 MG tablet TAKE 1 TABLET(40 MG) BY MOUTH TWICE DAILY   glucose blood (ACCU-CHEK GUIDE) test strip USE TO TEST BLOOD GLUCOSE AS DIRECTED UP TO 4 TIMES DAILY   haloperidol (HALDOL) 5 MG tablet Take 1 tablet (5 mg total) by mouth every 8 (eight) hours as needed (For nausea/vomiting).   HUMALOG KWIKPEN 200 UNIT/ML KwikPen Inject 50 Units into the skin 3 (three) times daily before meals.   hydrochlorothiazide (MICROZIDE) 12.5 MG capsule TAKE 1 CAPSULE(12.5 MG) BY MOUTH DAILY (Patient taking differently: Take 12.5 mg by mouth daily.)   hydrOXYzine (ATARAX) 10 MG tablet Take 10 mg by mouth at bedtime.   isosorbide mononitrate (IMDUR) 30 MG 24 hr tablet TAKE 1 TABLET(30 MG) BY MOUTH DAILY   losartan (COZAAR) 100 MG tablet TAKE 1 TABLET BY MOUTH EVERY DAY   megestrol (MEGACE) 40 MG tablet Take two tablets three times daily for 3 days, then two tablets twice daily   metoprolol succinate (TOPROL-XL) 100 MG 24 hr tablet Take 100 mg by mouth daily. Take with or immediately following a meal.   ondansetron (ZOFRAN-ODT) 4 MG disintegrating tablet Take 1 tablet (4 mg total) by mouth every 8 (eight) hours as needed for nausea or  vomiting.   oxyCODONE-acetaminophen (PERCOCET) 10-325 MG tablet Take 1 tablet by mouth every 4 (four) hours as needed for pain.   Pimozide 1 MG TABS Take by mouth.   polyethylene glycol (MIRALAX) 17 g packet Take 17 g by mouth daily.   potassium chloride SA (KLOR-CON M) 20 MEQ tablet Take 1 tablet (20 mEq total) by mouth daily.   rosuvastatin (CRESTOR) 20 MG tablet TAKE 1 TABLET(20 MG) BY MOUTH DAILY   senna-docusate (SENOKOT-S) 8.6-50 MG tablet Take 1 tablet by mouth daily.   SPIRIVA RESPIMAT 2.5 MCG/ACT AERS INHALE 2 PUFFS INTO THE LUNGS DAILY   spironolactone (ALDACTONE) 50 MG tablet TAKE 1 TABLET(50 MG) BY MOUTH DAILY   sucralfate (CARAFATE) 1 g tablet Take 1 tablet (1 g total) by mouth 4 (four) times daily -  with meals and at bedtime.   SYMBICORT 160-4.5 MCG/ACT inhaler INHALE 2 PUFFS INTO THE LUNGS TWICE DAILY   tiZANidine (  ZANAFLEX) 4 MG tablet Take 1 tablet (4 mg total) by mouth every 6 (six) hours as needed for muscle spasms.   tretinoin (RETIN-A) 0.025 % cream Apply topically.   [DISCONTINUED] dicyclomine (BENTYL) 20 MG tablet Take 1 tablet (20 mg total) by mouth 2 (two) times daily as needed (abdominal cramping).   [DISCONTINUED] doxycycline (VIBRAMYCIN) 50 MG capsule Take 50 mg by mouth daily.   [DISCONTINUED] empagliflozin (JARDIANCE) 25 MG TABS tablet Take 1 tablet (25 mg total) by mouth daily before breakfast.   [DISCONTINUED] insulin glargine, 2 Unit Dial, (TOUJEO MAX SOLOSTAR) 300 UNIT/ML Solostar Pen INJECT 140 UNITS INTO SKIN EVERY DAY   No facility-administered encounter medications on file as of 01/04/2023.     Review of Systems  Review of Systems  Constitutional: Negative.   HENT: Negative.    Cardiovascular: Negative.   Gastrointestinal: Negative.   Allergic/Immunologic: Negative.   Neurological: Negative.   Psychiatric/Behavioral: Negative.         Physical Exam  BP 116/62   Pulse 75   Temp (!) 97.4 F (36.3 C)   Ht 5\' 4"  (1.626 m)   Wt 279 lb (126.6  kg)   SpO2 100%   BMI 47.89 kg/m   Wt Readings from Last 5 Encounters:  01/04/23 279 lb (126.6 kg)  12/25/22 266 lb 12.8 oz (121 kg)  12/21/22 250 lb (113.4 kg)  12/21/22 250 lb (113.4 kg)  12/18/22 250 lb (113.4 kg)     Physical Exam Vitals and nursing note reviewed.  Constitutional:      General: She is not in acute distress.    Appearance: She is well-developed.  Cardiovascular:     Rate and Rhythm: Normal rate and regular rhythm.  Pulmonary:     Effort: Pulmonary effort is normal.     Breath sounds: Normal breath sounds.  Neurological:     Mental Status: She is alert and oriented to person, place, and time.      Lab Results:  CBC    Component Value Date/Time   WBC 9.6 01/04/2023 1556   WBC 13.2 (H) 12/21/2022 0702   RBC 4.49 01/04/2023 1556   RBC 5.19 (H) 12/21/2022 0702   HGB 12.1 01/04/2023 1556   HCT 38.3 01/04/2023 1556   PLT 248 01/04/2023 1556   MCV 85 01/04/2023 1556   MCH 26.9 01/04/2023 1556   MCH 27.2 12/21/2022 0702   MCHC 31.6 01/04/2023 1556   MCHC 34.0 12/21/2022 0702   RDW 15.8 (H) 01/04/2023 1556   LYMPHSABS 3.2 12/18/2022 0054   LYMPHSABS 2.3 07/29/2021 1629   MONOABS 0.9 12/18/2022 0054   EOSABS 0.3 12/18/2022 0054   EOSABS 0.4 07/29/2021 1629   BASOSABS 0.1 12/18/2022 0054   BASOSABS 0.0 07/29/2021 1629    BMET    Component Value Date/Time   NA 142 01/04/2023 1556   K 4.0 01/04/2023 1556   CL 103 01/04/2023 1556   CO2 24 01/04/2023 1556   GLUCOSE 141 (H) 01/04/2023 1556   GLUCOSE 174 (H) 12/22/2022 0132   BUN 11 01/04/2023 1556   CREATININE 1.15 (H) 01/04/2023 1556   CREATININE 1.18 (H) 08/31/2017 1621   CALCIUM 9.6 01/04/2023 1556   GFRNONAA 39 (L) 12/22/2022 0132   GFRNONAA 58 (L) 08/31/2017 1621   GFRAA 49 (L) 12/02/2020 1405   GFRAA 67 08/31/2017 1621    BNP    Component Value Date/Time   BNP 129.3 (H) 09/18/2022 1630   BNP 67.5 03/30/2017 1438    ProBNP  Component Value Date/Time   PROBNP 57.0  07/12/2020 1649    Imaging: CT ABDOMEN PELVIS W CONTRAST  Result Date: 12/21/2022 CLINICAL DATA:  Pain, vomiting, diarrhea EXAM: CT ABDOMEN AND PELVIS WITH CONTRAST TECHNIQUE: Multidetector CT imaging of the abdomen and pelvis was performed using the standard protocol following bolus administration of intravenous contrast. RADIATION DOSE REDUCTION: This exam was performed according to the departmental dose-optimization program which includes automated exposure control, adjustment of the mA and/or kV according to patient size and/or use of iterative reconstruction technique. CONTRAST:  100 mL OMNIPAQUE IOHEXOL 300 MG/ML  SOLN COMPARISON:  09/18/2022 FINDINGS: Lower chest: No acute abnormality. No pleural or pericardial effusion. Hepatobiliary: No focal liver abnormality is seen. No gallstones, gallbladder wall thickening, or biliary dilatation. Pancreas: Unremarkable. No pancreatic ductal dilatation or surrounding inflammatory changes. Spleen: Normal in size without focal abnormality. Adrenals/Urinary Tract: Adrenal glands are unremarkable. Kidneys are normal, without renal calculi, focal lesion, or hydronephrosis. Bladder is unremarkable. Stomach/Bowel: Stomach is within normal limits. Appendix appears normal. No evidence of bowel wall thickening, distention, or inflammatory changes. Vascular/Lymphatic: No significant vascular findings are present. No enlarged abdominal or pelvic lymph nodes. Reproductive: Uterus and bilateral adnexa are unremarkable. Other: No abdominal wall hernia or abnormality. No abdominopelvic ascites. Musculoskeletal: No acute or significant osseous findings. IMPRESSION: No acute abdominal or pelvic pathology identified. Electronically Signed   By: Sammie Bench M.D.   On: 12/21/2022 08:15   DG Lumbar Spine Complete  Result Date: 12/18/2022 CLINICAL DATA:  Fall yesterday with back pain. EXAM: LUMBAR SPINE - COMPLETE 4+ VIEW COMPARISON:  11/08/2021. FINDINGS: There is no evidence of  lumbar spine fracture. Alignment is normal. Intervertebral disc spaces are maintained. IMPRESSION: Negative. Electronically Signed   By: Brett Fairy M.D.   On: 12/18/2022 04:45   DG Chest Portable 1 View  Result Date: 12/18/2022 CLINICAL DATA:  Chest pain, fall yesterday. EXAM: PORTABLE CHEST 1 VIEW COMPARISON:  09/18/2022. FINDINGS: The heart is enlarged and the mediastinal contour is within normal limits. Pulmonary vasculature is distended. Mild patchy airspace disease is noted at the lung bases. No effusion or pneumothorax. No acute osseous abnormality. IMPRESSION: 1. Cardiomegaly with pulmonary vascular congestion. 2. Scattered airspace opacities are noted at the lung bases, possible edema or infiltrate. Electronically Signed   By: Brett Fairy M.D.   On: 12/18/2022 04:43     Assessment & Plan:   Controlled type 2 diabetes mellitus without complication, with long-term current use of insulin ([place]) - Ambulatory referral to Ophthalmology - POCT glycosylated hemoglobin (Hb A1C) - AMB Referral to Pharmacy Medication Management - CBC - Comprehensive metabolic panel  Follow up:  Follow up in 3 months or sooner if needed     Fenton Foy, NP 01/05/2023

## 2023-01-05 ENCOUNTER — Encounter: Payer: Self-pay | Admitting: Nurse Practitioner

## 2023-01-05 ENCOUNTER — Telehealth: Payer: Self-pay

## 2023-01-05 LAB — COMPREHENSIVE METABOLIC PANEL
ALT: 8 IU/L (ref 0–32)
AST: 16 IU/L (ref 0–40)
Albumin/Globulin Ratio: 1.5 (ref 1.2–2.2)
Albumin: 3.8 g/dL — ABNORMAL LOW (ref 3.9–4.9)
Alkaline Phosphatase: 90 IU/L (ref 44–121)
BUN/Creatinine Ratio: 10 (ref 9–23)
BUN: 11 mg/dL (ref 6–24)
Bilirubin Total: <0.2 mg/dL (ref 0.0–1.2)
CO2: 24 mmol/L (ref 20–29)
Calcium: 9.6 mg/dL (ref 8.7–10.2)
Chloride: 103 mmol/L (ref 96–106)
Creatinine, Ser: 1.15 mg/dL — ABNORMAL HIGH (ref 0.57–1.00)
Globulin, Total: 2.5 g/dL (ref 1.5–4.5)
Glucose: 141 mg/dL — ABNORMAL HIGH (ref 70–99)
Potassium: 4 mmol/L (ref 3.5–5.2)
Sodium: 142 mmol/L (ref 134–144)
Total Protein: 6.3 g/dL (ref 6.0–8.5)
eGFR: 59 mL/min/{1.73_m2} — ABNORMAL LOW (ref >59)

## 2023-01-05 LAB — CBC
Hematocrit: 38.3 % (ref 34.0–46.6)
Hemoglobin: 12.1 g/dL (ref 11.1–15.9)
MCH: 26.9 pg (ref 26.6–33.0)
MCHC: 31.6 g/dL (ref 31.5–35.7)
MCV: 85 fL (ref 79–97)
Platelets: 248 10*3/uL (ref 150–450)
RBC: 4.49 x10E6/uL (ref 3.77–5.28)
RDW: 15.8 % — ABNORMAL HIGH (ref 11.7–15.4)
WBC: 9.6 10*3/uL (ref 3.4–10.8)

## 2023-01-05 NOTE — Progress Notes (Unsigned)
   Care Guide Note  01/05/2023 Name: BEVERLYANN VIERLING MRN: [id number] DOB: 1976/02/19  Referred by: Fenton Foy, NP Reason for referral : Care Coordination (Outreach to schedule 70min with pharm d )   KATLYN LANDUYT is a 47 y.o. year old female who is a primary care patient of Fenton Foy, NP. ALLISA JURY was referred to the pharmacist for assistance related to DM.    An unsuccessful telephone outreach was attempted today to contact the patient who was referred to the pharmacy team for assistance with medication management. Additional attempts will be made to contact the patient.   Noreene Larsson, [place], [place] [address] Direct Dial: [phone] [email or web address]

## 2023-01-05 NOTE — Assessment & Plan Note (Signed)
-   Ambulatory referral to Ophthalmology - POCT glycosylated hemoglobin (Hb A1C) - AMB Referral to Pharmacy Medication Management - CBC - Comprehensive metabolic panel  Follow up:  Follow up in 3 months or sooner if needed

## 2023-01-08 ENCOUNTER — Other Ambulatory Visit: Payer: Self-pay

## 2023-01-08 MED ORDER — POTASSIUM CHLORIDE CRYS ER 20 MEQ PO TBCR
20.0000 meq | EXTENDED_RELEASE_TABLET | Freq: Every day | ORAL | 0 refills | Status: DC
  Filled 2023-01-08 – 2023-02-05 (×2): qty 30, 30d supply, fill #0

## 2023-01-09 ENCOUNTER — Other Ambulatory Visit: Payer: Self-pay

## 2023-01-09 NOTE — Progress Notes (Unsigned)
   Care Guide Note  01/09/2023 Name: Jill Shaw MRN: [id number] DOB: 19-Jan-1976  Referred by: Fenton Foy, NP Reason for referral : Care Coordination (Outreach to schedule 39min with pharm d )   Jill Shaw is a 47 y.o. year old female who is a primary care patient of Fenton Foy, NP. [name] was referred to the pharmacist for assistance related to DM.    A second unsuccessful telephone outreach was attempted today to contact the patient who was referred to the pharmacy team for assistance with medication management. Additional attempts will be made to contact the patient.  Noreene Larsson, [place], [place] [address] Direct Dial: [phone] [email or web address]

## 2023-01-11 ENCOUNTER — Other Ambulatory Visit: Payer: Self-pay

## 2023-01-11 DIAGNOSIS — I10 Essential (primary) hypertension: Secondary | ICD-10-CM

## 2023-01-11 MED ORDER — LOSARTAN POTASSIUM 100 MG PO TABS: 100.0000 mg | ORAL_TABLET | Freq: Every day | ORAL | 0 refills | Status: DC

## 2023-01-11 MED ORDER — DEXLANSOPRAZOLE 30 MG PO CPDR: 30.0000 mg | DELAYED_RELEASE_CAPSULE | Freq: Every day | ORAL | 0 refills | Status: DC

## 2023-01-11 NOTE — Progress Notes (Signed)
   Care Guide Note  01/11/2023 Name: Jill Shaw MRN: [id number] DOB: [birth year]  Referred by: Fenton Foy, NP Reason for referral : Care Coordination (Outreach to schedule 73min with pharm d )   Jill Shaw is a 47 y.o. year old female who is a primary care patient of Fenton Foy, NP. Jill Shaw was referred to the pharmacist for assistance related to DM.    A third unsuccessful telephone outreach was attempted today to contact the patient who was referred to the pharmacy team for assistance with medication management. The Population Health team is pleased to engage with this patient at any time in the future upon receipt of referral and should he/she be interested in assistance from the [facility] team.   Noreene Larsson, [place], [place] [address] Direct Dial: [phone] [email or web address]

## 2023-01-11 NOTE — Telephone Encounter (Signed)
Done and pt was called. [place]

## 2023-01-11 NOTE — Telephone Encounter (Signed)
Pt was called and advised to answer any unknown number to make sure she speaks With Catie concerning all her meds. [place]

## 2023-01-16 ENCOUNTER — Telehealth: Payer: Self-pay

## 2023-01-16 ENCOUNTER — Other Ambulatory Visit: Payer: Self-pay

## 2023-01-16 ENCOUNTER — Encounter: Payer: Self-pay | Admitting: Physical Medicine and Rehabilitation

## 2023-01-16 MED ORDER — ONDANSETRON 4 MG PO TBDP
4.0000 mg | ORAL_TABLET | Freq: Three times a day (TID) | ORAL | 0 refills | Status: DC | PRN
  Filled 2023-01-16: qty 12, 4d supply, fill #0

## 2023-01-16 NOTE — Telephone Encounter (Signed)
Dr. Ranell Patrick is out of the office this week. Please advise.  Patient is aware that Dr. Ranell Patrick is out of the office. However she is requesting an early release of  Oxycodone 10-325 MG. Patient stated she will be going out of town on Friday.  Where she will be meeting with her Daughter's Medical Doctor.   She will call back with her current medication count tomorrow.   Call back phone [phone].

## 2023-01-17 ENCOUNTER — Other Ambulatory Visit: Payer: Self-pay | Admitting: Nurse Practitioner

## 2023-01-17 ENCOUNTER — Encounter (HOSPITAL_COMMUNITY): Payer: Self-pay

## 2023-01-17 ENCOUNTER — Other Ambulatory Visit: Payer: Self-pay

## 2023-01-17 ENCOUNTER — Emergency Department (HOSPITAL_COMMUNITY)
Admission: EM | Admit: 2023-01-17 | Discharge: 2023-01-17 | Disposition: A | Discharge status: Home / Self Care | Payer: Medicare Other | Attending: Emergency Medicine | Admitting: Emergency Medicine

## 2023-01-17 ENCOUNTER — Emergency Department (HOSPITAL_COMMUNITY): Payer: Medicare Other

## 2023-01-17 ENCOUNTER — Telehealth: Payer: Self-pay | Admitting: Registered Nurse

## 2023-01-17 DIAGNOSIS — Z20822 Contact with and (suspected) exposure to covid-19: Secondary | ICD-10-CM | POA: Diagnosis not present

## 2023-01-17 DIAGNOSIS — Z7951 Long term (current) use of inhaled steroids: Secondary | ICD-10-CM | POA: Insufficient documentation

## 2023-01-17 DIAGNOSIS — I10 Essential (primary) hypertension: Secondary | ICD-10-CM | POA: Insufficient documentation

## 2023-01-17 DIAGNOSIS — E1165 Type 2 diabetes mellitus with hyperglycemia: Secondary | ICD-10-CM | POA: Diagnosis not present

## 2023-01-17 DIAGNOSIS — R509 Fever, unspecified: Secondary | ICD-10-CM | POA: Diagnosis not present

## 2023-01-17 DIAGNOSIS — R319 Hematuria, unspecified: Secondary | ICD-10-CM | POA: Diagnosis not present

## 2023-01-17 DIAGNOSIS — R1084 Generalized abdominal pain: Secondary | ICD-10-CM | POA: Insufficient documentation

## 2023-01-17 DIAGNOSIS — R079 Chest pain, unspecified: Secondary | ICD-10-CM | POA: Insufficient documentation

## 2023-01-17 DIAGNOSIS — R300 Dysuria: Secondary | ICD-10-CM | POA: Insufficient documentation

## 2023-01-17 DIAGNOSIS — R0602 Shortness of breath: Secondary | ICD-10-CM | POA: Insufficient documentation

## 2023-01-17 DIAGNOSIS — K625 Hemorrhage of anus and rectum: Secondary | ICD-10-CM | POA: Diagnosis not present

## 2023-01-17 DIAGNOSIS — J449 Chronic obstructive pulmonary disease, unspecified: Secondary | ICD-10-CM | POA: Insufficient documentation

## 2023-01-17 DIAGNOSIS — R112 Nausea with vomiting, unspecified: Secondary | ICD-10-CM | POA: Diagnosis not present

## 2023-01-17 DIAGNOSIS — R0603 Acute respiratory distress: Secondary | ICD-10-CM | POA: Diagnosis not present

## 2023-01-17 DIAGNOSIS — Z794 Long term (current) use of insulin: Secondary | ICD-10-CM | POA: Insufficient documentation

## 2023-01-17 DIAGNOSIS — R197 Diarrhea, unspecified: Secondary | ICD-10-CM | POA: Diagnosis not present

## 2023-01-17 DIAGNOSIS — R000 Tachycardia, unspecified: Secondary | ICD-10-CM | POA: Insufficient documentation

## 2023-01-17 DIAGNOSIS — Z79899 Other long term (current) drug therapy: Secondary | ICD-10-CM | POA: Diagnosis not present

## 2023-01-17 DIAGNOSIS — R109 Unspecified abdominal pain: Secondary | ICD-10-CM | POA: Diagnosis not present

## 2023-01-17 LAB — COMPREHENSIVE METABOLIC PANEL
ALT: 10 U/L (ref 0–44)
AST: 15 U/L (ref 15–41)
Albumin: 3.9 g/dL (ref 3.5–5.0)
Alkaline Phosphatase: 69 U/L (ref 38–126)
Anion gap: 13 (ref 5–15)
BUN: 5 mg/dL — ABNORMAL LOW (ref 6–20)
CO2: 18 mmol/L — ABNORMAL LOW (ref 22–32)
Calcium: 9.4 mg/dL (ref 8.9–10.3)
Chloride: 111 mmol/L (ref 98–111)
Creatinine, Ser: 1.2 mg/dL — ABNORMAL HIGH (ref 0.44–1.00)
GFR, Estimated: 57 mL/min — ABNORMAL LOW (ref >60)
Glucose, Bld: 87 mg/dL (ref 70–99)
Potassium: 3.3 mmol/L — ABNORMAL LOW (ref 3.5–5.1)
Sodium: 142 mmol/L (ref 135–145)
Total Bilirubin: 0.9 mg/dL (ref 0.3–1.2)
Total Protein: 7.1 g/dL (ref 6.5–8.1)

## 2023-01-17 LAB — CBC WITH DIFFERENTIAL/PLATELET
Abs Immature Granulocytes: 0.03 10*3/uL (ref 0.00–0.07)
Basophils Absolute: 0.1 10*3/uL (ref 0.0–0.1)
Basophils Relative: 1 %
Eosinophils Absolute: 0.2 10*3/uL (ref 0.0–0.5)
Eosinophils Relative: 2 %
HCT: 40.7 % (ref 36.0–46.0)
Hemoglobin: 13.2 g/dL (ref 12.0–15.0)
Immature Granulocytes: 0 %
Lymphocytes Relative: 21 %
Lymphs Abs: 2.3 10*3/uL (ref 0.7–4.0)
MCH: 26.5 pg (ref 26.0–34.0)
MCHC: 32.4 g/dL (ref 30.0–36.0)
MCV: 81.7 fL (ref 80.0–100.0)
Monocytes Absolute: 0.5 10*3/uL (ref 0.1–1.0)
Monocytes Relative: 5 %
Neutro Abs: 7.5 10*3/uL (ref 1.7–7.7)
Neutrophils Relative %: 71 %
Platelets: UNDETERMINED 10*3/uL (ref 150–400)
RBC: 4.98 MIL/uL (ref 3.87–5.11)
RDW: 16.7 % — ABNORMAL HIGH (ref 11.5–15.5)
WBC: 10.5 10*3/uL (ref 4.0–10.5)
nRBC: 0 % (ref 0.0–0.2)

## 2023-01-17 LAB — URINALYSIS, ROUTINE W REFLEX MICROSCOPIC
Bacteria, UA: NONE SEEN
Bilirubin Urine: NEGATIVE
Glucose, UA: NEGATIVE mg/dL
Ketones, ur: 5 mg/dL — AB
Leukocytes,Ua: NEGATIVE
Nitrite: NEGATIVE
Protein, ur: 30 mg/dL — AB
RBC / HPF: >50 RBC/hpf (ref 0–5)
Specific Gravity, Urine: 1.019 (ref 1.005–1.030)
pH: 6 (ref 5.0–8.0)

## 2023-01-17 LAB — I-STAT BETA HCG BLOOD, ED (MC, WL, AP ONLY): I-stat hCG, quantitative: <5 m[IU]/mL (ref <5)

## 2023-01-17 LAB — LIPASE, BLOOD: Lipase: 17 U/L (ref 11–51)

## 2023-01-17 LAB — TROPONIN I (HIGH SENSITIVITY)
Troponin I (High Sensitivity): 9 ng/L (ref <18)
Troponin I (High Sensitivity): 9 ng/L (ref <18)

## 2023-01-17 LAB — RESP PANEL BY RT-PCR (RSV, FLU A&B, COVID)  RVPGX2
Influenza A by PCR: NEGATIVE
Influenza B by PCR: NEGATIVE
Resp Syncytial Virus by PCR: NEGATIVE
SARS Coronavirus 2 by RT PCR: NEGATIVE

## 2023-01-17 LAB — CBG MONITORING, ED
Glucose-Capillary: 103 mg/dL — ABNORMAL HIGH (ref 70–99)
Glucose-Capillary: 99 mg/dL (ref 70–99)
Glucose-Capillary: 119 mg/dL — ABNORMAL HIGH (ref 70–99)

## 2023-01-17 MED ORDER — METOCLOPRAMIDE HCL 10 MG PO TABS: 10.0000 mg | ORAL_TABLET | Freq: Four times a day (QID) | ORAL | 0 refills | Status: DC | PRN

## 2023-01-17 MED ORDER — HYDROMORPHONE HCL 1 MG/ML IJ SOLN
1.0000 mg | Freq: Once | INTRAMUSCULAR | Status: AC
  Administered 2023-01-17: 1 mg via INTRAVENOUS
  Filled 2023-01-17: qty 1

## 2023-01-17 MED ORDER — DROPERIDOL 2.5 MG/ML IJ SOLN
2.5000 mg | Freq: Once | INTRAMUSCULAR | Status: AC
  Administered 2023-01-17: 2.5 mg via INTRAVENOUS
  Filled 2023-01-17: qty 2

## 2023-01-17 MED ORDER — DICYCLOMINE HCL 20 MG PO TABS
20.0000 mg | ORAL_TABLET | Freq: Two times a day (BID) | ORAL | 0 refills | Status: DC
  Filled 2023-01-17: qty 20, 10d supply, fill #0

## 2023-01-17 MED ORDER — HYDROCHLOROTHIAZIDE 12.5 MG PO TABS
12.5000 mg | ORAL_TABLET | Freq: Once | ORAL | Status: AC
  Administered 2023-01-17: 12.5 mg via ORAL
  Filled 2023-01-17: qty 1

## 2023-01-17 MED ORDER — IOHEXOL 350 MG/ML SOLN
150.0000 mL | Freq: Once | INTRAVENOUS | Status: AC | PRN
  Administered 2023-01-17: 150 mL via INTRAVENOUS

## 2023-01-17 MED ORDER — LACTATED RINGERS IV BOLUS
1000.0000 mL | Freq: Once | INTRAVENOUS | Status: AC
  Administered 2023-01-17: 1000 mL via INTRAVENOUS

## 2023-01-17 MED ORDER — HYDROMORPHONE HCL 1 MG/ML IJ SOLN
0.5000 mg | Freq: Once | INTRAMUSCULAR | Status: AC
  Administered 2023-01-17: 0.5 mg via INTRAVENOUS
  Filled 2023-01-17: qty 1

## 2023-01-17 MED ORDER — POTASSIUM CHLORIDE CRYS ER 20 MEQ PO TBCR
40.0000 meq | EXTENDED_RELEASE_TABLET | Freq: Once | ORAL | Status: AC
  Administered 2023-01-17: 40 meq via ORAL
  Filled 2023-01-17: qty 2

## 2023-01-17 MED ORDER — FAMOTIDINE IN NACL 20-0.9 MG/50ML-% IV SOLN
20.0000 mg | Freq: Once | INTRAVENOUS | Status: AC
  Administered 2023-01-17: 20 mg via INTRAVENOUS
  Filled 2023-01-17: qty 50

## 2023-01-17 MED ORDER — ONDANSETRON 4 MG PO TBDP
4.0000 mg | ORAL_TABLET | Freq: Three times a day (TID) | ORAL | 0 refills | Status: AC | PRN
  Filled 2023-01-17 – 2023-01-18 (×2): qty 12, 4d supply, fill #0

## 2023-01-17 MED ORDER — LIDOCAINE VISCOUS HCL 2 % MT SOLN
15.0000 mL | Freq: Once | OROMUCOSAL | Status: AC
  Administered 2023-01-17: 15 mL via ORAL
  Filled 2023-01-17: qty 15

## 2023-01-17 MED ORDER — ALUM & MAG HYDROXIDE-SIMETH 200-200-20 MG/5ML PO SUSP
30.0000 mL | Freq: Once | ORAL | Status: AC
  Administered 2023-01-17: 30 mL via ORAL
  Filled 2023-01-17: qty 30

## 2023-01-17 MED ORDER — SODIUM CHLORIDE 0.9 % IV BOLUS
1000.0000 mL | Freq: Once | INTRAVENOUS | Status: AC
  Administered 2023-01-17: 1000 mL via INTRAVENOUS

## 2023-01-17 MED ORDER — CLONIDINE HCL 0.1 MG PO TABS
0.1000 mg | ORAL_TABLET | Freq: Once | ORAL | Status: AC
  Administered 2023-01-17: 0.1 mg via ORAL
  Filled 2023-01-17: qty 1

## 2023-01-17 MED ORDER — LOSARTAN POTASSIUM 50 MG PO TABS
100.0000 mg | ORAL_TABLET | Freq: Every day | ORAL | Status: DC
  Administered 2023-01-17: 100 mg via ORAL
  Filled 2023-01-17: qty 2

## 2023-01-17 MED ORDER — METOCLOPRAMIDE HCL 5 MG/ML IJ SOLN
10.0000 mg | Freq: Once | INTRAMUSCULAR | Status: AC
  Administered 2023-01-17: 10 mg via INTRAVENOUS
  Filled 2023-01-17: qty 2

## 2023-01-17 MED ORDER — ONDANSETRON HCL 4 MG/2ML IJ SOLN
4.0000 mg | Freq: Once | INTRAMUSCULAR | Status: AC
  Administered 2023-01-17: 4 mg via INTRAVENOUS
  Filled 2023-01-17: qty 2

## 2023-01-17 MED ORDER — MAGNESIUM SULFATE 2 GM/50ML IV SOLN
2.0000 g | Freq: Once | INTRAVENOUS | Status: AC
  Administered 2023-01-17: 2 g via INTRAVENOUS
  Filled 2023-01-17: qty 50

## 2023-01-17 MED ORDER — CAPSAICIN 0.025 % EX CREA
TOPICAL_CREAM | Freq: Once | CUTANEOUS | Status: DC
  Filled 2023-01-17: qty 60

## 2023-01-17 MED ORDER — KETOROLAC TROMETHAMINE 15 MG/ML IJ SOLN
15.0000 mg | Freq: Once | INTRAMUSCULAR | Status: AC
  Administered 2023-01-17: 15 mg via INTRAVENOUS
  Filled 2023-01-17: qty 1

## 2023-01-17 NOTE — Telephone Encounter (Signed)
Please call to see what medication the patient needs and send it as refill request. Thanks.

## 2023-01-17 NOTE — ED Provider Notes (Signed)
  Physical Exam  BP (!) 167/72   Pulse 81   Temp 99.1 F (37.3 C) (Oral)   Resp (!) 21   Ht 5\' 4"  (1.626 m)   Wt 126 kg   SpO2 98%   BMI 47.68 kg/m   Physical Exam  Procedures  Procedures  ED Course / MDM    Medical Decision Making Amount and/or Complexity of Data Reviewed Labs: ordered. Radiology: ordered.  Risk OTC drugs. Prescription drug management.   47 year old female signed out to me at shift change pending reevaluation, magnesium.  Please see previous fatter note for further details.  In short, this is a 47 year old female with history of chronic abdominal pain diabetes 2, COPD, GERD, hypertension.  Patient came in with 1 day of severe generalized abdominal pain radiating to her back.  The patient was signed out to me pending the completion of her magnesium which she was receiving through an IV.  At shift handoff, the plan that was provided to me was that the patient will be discharged home after magnesium is finished.  Reevaluated the patient, she is complaining of increased generalized abdominal pain.  The patient had unremarkable CT angiogram of chest abdomen pelvis for concern of dissection. The patient has had no nausea or vomiting while she has been here.  The patient was noted to be hypertensive, states that she has been unable to take her blood pressure medication the last 3 days secondary to nausea and vomiting.  Patient was provided home blood pressure medications here to include losartan, hydrochlorothiazide.  Patient blood pressure had not normalized after these were administered.  Patient was then administered 0.1 mg clonidine.  Patient blood pressure normalized at this time.  Patient complained of continued abdominal pain so she was provided additional milligram Dilaudid.  She has received 3 total milligrams at this time.  Patient blood glucose 119 at this time.  Prior to discharge patient requesting a send her home with Dilaudid pills.  Checked patient  PDMP, she received 180 10 mg oxycodones on 3/4.  For this reason, I will advise the patient to continue taking her home oxycodone.  The patient reports that she has a refill coming up on 4/5, this is on Friday.  I will send the patient with Reglan for nausea and vomiting.  Patient will be advised to return to the ED with any continued nausea or vomiting.  The patient was advised to follow-up with her PCP for reevaluation.  Patient was given return precautions and she voiced understanding.  Had all of her questions answered her satisfaction.  Patient case discussed with Dr. Oswald Hillock who voices agreement with plan of management.  Patient stable for discharge.    Azucena Cecil, PA-C 01/17/23 2124    Tretha Sciara, MD 01/18/23 1534

## 2023-01-17 NOTE — Telephone Encounter (Signed)
Sent Ms Grissett a My- Chart message today, awaiting a reply.  Call placed to Ms. Dorman no answer left message to return the call.  PMP was Reviewed, her last Oxycodone prescription was filled on 12/22/2022. Awaiting a return call.

## 2023-01-17 NOTE — Discharge Instructions (Addendum)
Return to the ED with any new or worsening signs or symptoms such as continued nausea or vomiting  Please pick up prescribed medications sent to your pharmacy for your nausea. Please continue taking your pain medication at home as directed Please read the attached guide concerning abdominal pain Please follow-up with PCP for reevaluation

## 2023-01-17 NOTE — ED Triage Notes (Signed)
Pt c/o generalized constant sharp, stabbing abd pain, N/V/D x 1 day; states she had approx 15 episodes of vomiting, 3 episodes of diarrhea; nothing makes the pain better or worse; denies known sick contacts, denies fevers, denies urinary issues

## 2023-01-17 NOTE — ED Notes (Signed)
Leads are off

## 2023-01-17 NOTE — ED Provider Notes (Addendum)
[facility] Provider Note   CSN: [id number] Arrival date & time: 01/17/23  [id number]     History  Chief Complaint  Patient presents with   Abdominal Pain    Jill Shaw is a 47 y.o. female history of DM2, COPD, GERD, HTN with 1 day of severe generalized abdominal pain that radiates to her back.  Patient states she spoke to her doctor and her doctor told her to come to the ER to be possibly admitted for her abdominal pain.  She has endorsed multiple episodes of nausea vomiting without blood.  Patient states she feels abdominal pain everywhere in her belly and it radiates to her back and down both legs however she still able to feel and move her feet but unable to walk due to pain.  Patient says she has her gallbladder and appendix.  Patient denies history of aortic aneurysm.  Patient has not tried ibuprofen as she states it makes her nauseous.  Patient had chest pain, shortness of breath, dysuria/hematuria, hematochezia/diarrhea, fever/chills, change in sensation, skin color changes, vaginal bleeding or discharge  Home Medications Prior to Admission medications   Medication Sig Start Date End Date Taking? Authorizing Provider  Accu-Chek Softclix Lancets lancets USE TO TEST AS DIRECTED UP TO FOUR TIMES DAILY. 03/06/22   Shamleffer, Melanie Crazier, MD  albuterol (PROVENTIL) (2.5 MG/3ML) 0.083% nebulizer solution Inhale into the lungs. 09/27/20   [provider]  albuterol (VENTOLIN HFA) 108 (90 Base) MCG/ACT inhaler Inhale 2 puffs into the lungs every 6 (six) hours as needed for wheezing or shortness of breath. 07/18/21   Margaretha Seeds, MD  B-D ULTRAFINE III SHORT PEN 31G X 8 MM MISC USE AS DIRECTED EVERY MORNING, NOON, EVENING, AND AT BEDTIME 07/10/22   Shamleffer, Melanie Crazier, MD  baclofen (LIORESAL) 10 MG tablet Take 1 tablet (10 mg total) by mouth 3 (three) times daily as needed for muscle spasms. 06/21/22   Raulkar, Clide Deutscher, MD   cetirizine (ZYRTEC) 10 MG tablet Take 1 tablet by mouth daily. 06/07/21   [provider]  Continuous Blood Gluc Sensor (FREESTYLE LIBRE 3 SENSOR) MISC Place 1 sensor on the skin every 14 days. Use to check glucose continuously 07/06/22   Fenton Foy, NP  Dexlansoprazole 30 MG capsule DR Take 1 capsule (30 mg total) by mouth daily. NEEDS OFFICE VISIT FOR ADDITIONAL REFILLS 01/11/23   Fenton Foy, NP  diclofenac Sodium (VOLTAREN) 1 % GEL APPLY 2 GRAMS TOPICALLY TO THE AFFECTED AREA FOUR TIMES DAILY 03/05/22   Sherilyn Cooter, MD  dicyclomine (BENTYL) 20 MG tablet Take 1 tablet (20 mg total) by mouth 2 (two) times daily. 01/20/22   Harris, Vernie Shanks, PA-C  furosemide (LASIX) 40 MG tablet TAKE 1 TABLET(40 MG) BY MOUTH TWICE DAILY 12/25/22   Patwardhan, Manish J, MD  glucose blood (ACCU-CHEK GUIDE) test strip USE TO TEST BLOOD GLUCOSE AS DIRECTED UP TO 4 TIMES DAILY 11/14/22   Fenton Foy, NP  haloperidol (HALDOL) 5 MG tablet Take 1 tablet (5 mg total) by mouth every 8 (eight) hours as needed (For nausea/vomiting). 03/31/22   Molpus, John, MD  HUMALOG KWIKPEN 200 UNIT/ML KwikPen Inject 50 Units into the skin 3 (three) times daily before meals. 12/27/22   Fenton Foy, NP  hydrochlorothiazide (MICROZIDE) 12.5 MG capsule TAKE 1 CAPSULE(12.5 MG) BY MOUTH DAILY Patient taking differently: Take 12.5 mg by mouth daily. 12/14/21   Cantwell, Celeste C, PA-C  hydrOXYzine (ATARAX)  10 MG tablet Take 10 mg by mouth at bedtime. 04/26/22   [provider]  isosorbide mononitrate (IMDUR) 30 MG 24 hr tablet TAKE 1 TABLET(30 MG) BY MOUTH DAILY 03/23/22   Patwardhan, Manish J, MD  losartan (COZAAR) 100 MG tablet Take 1 tablet (100 mg total) by mouth daily. 01/11/23   Fenton Foy, NP  megestrol (MEGACE) 40 MG tablet Take two tablets three times daily for 3 days, then two tablets twice daily 10/23/22   Darliss Cheney, MD  metoprolol succinate (TOPROL-XL) 100 MG 24 hr tablet Take 100 mg by mouth  daily. Take with or immediately following a meal.    [provider]  ondansetron (ZOFRAN-ODT) 4 MG disintegrating tablet Take 1 tablet (4 mg total) by mouth every 8 (eight) hours as needed for up to 5 days for nausea or vomiting. 01/17/23 01/22/23  Chuck Hint, PA-C  oxyCODONE-acetaminophen (PERCOCET) 10-325 MG tablet Take 1 tablet by mouth every 4 (four) hours as needed for pain. 12/20/22   Raulkar, Clide Deutscher, MD  Pimozide 1 MG TABS Take by mouth. 06/13/22   [provider]  polyethylene glycol (MIRALAX) 17 g packet Take 17 g by mouth daily. 12/21/22   Elgie Congo, MD  potassium chloride SA (KLOR-CON M) 20 MEQ tablet Take 1 tablet (20 mEq total) by mouth daily. 01/08/23   Fenton Foy, NP  rosuvastatin (CRESTOR) 20 MG tablet TAKE 1 TABLET(20 MG) BY MOUTH DAILY 10/31/22   Patwardhan, Manish J, MD  senna-docusate (SENOKOT-S) 8.6-50 MG tablet Take 1 tablet by mouth daily. 12/21/22 01/20/23  Elgie Congo, MD  SPIRIVA RESPIMAT 2.5 MCG/ACT AERS INHALE 2 PUFFS INTO THE LUNGS DAILY 02/03/22   Icard, Octavio Graves, DO  spironolactone (ALDACTONE) 50 MG tablet TAKE 1 TABLET(50 MG) BY MOUTH DAILY 12/22/20   Patwardhan, Manish J, MD  sucralfate (CARAFATE) 1 g tablet Take 1 tablet (1 g total) by mouth 4 (four) times daily -  with meals and at bedtime. 08/21/21   Isla Pence, MD  SYMBICORT 160-4.5 MCG/ACT inhaler INHALE 2 PUFFS INTO THE LUNGS TWICE DAILY 10/31/22   Icard, Octavio Graves, DO  tiZANidine (ZANAFLEX) 4 MG tablet Take 1 tablet (4 mg total) by mouth every 6 (six) hours as needed for muscle spasms. 01/01/23   Raulkar, Clide Deutscher, MD  tretinoin (RETIN-A) 0.025 % cream Apply topically. 08/12/22   [provider]      Allergies    Elavil [amitriptyline], Orudis [ketoprofen], Desyrel [trazodone], Aspirin, Motrin [ibuprofen], Naprosyn [naproxen], Neurontin [gabapentin], Sulfa antibiotics, Ultram [tramadol], Victoza [liraglutide], and Zegerid [omeprazole-sodium bicarbonate]     Review of Systems   Review of Systems  Gastrointestinal:  Positive for abdominal pain.  See HPI  Physical Exam Updated Vital Signs BP (!) 191/83 (BP Location: Right Wrist)   Pulse 91   Temp 99.1 F (37.3 C) (Oral)   Resp 14   Ht 5\' 4"  (1.626 m)   Wt 126 kg   SpO2 100%   BMI 47.68 kg/m  Physical Exam Constitutional:      General: She is in acute distress.     Comments: Tearful  Cardiovascular:     Rate and Rhythm: Tachycardia present.     Pulses: Normal pulses.     Heart sounds: Normal heart sounds. No murmur heard.    Comments: 2+ bilateral radial/posterior tibialis pulses with increased rate Pulmonary:     Effort: Pulmonary effort is normal. No respiratory distress.     Breath sounds: Normal breath  sounds.  Abdominal:     Palpations: Abdomen is soft. There is no pulsatile mass.     Tenderness: There is generalized abdominal tenderness. There is guarding. There is no rebound.  Musculoskeletal:        General: Normal range of motion.     Right lower leg: No edema.     Left lower leg: No edema.     Comments: Full active range of motion in all 4 limbs  Skin:    General: Skin is warm and dry.     Capillary Refill: Capillary refill takes less than 2 seconds.  Neurological:     General: No focal deficit present.     Mental Status: She is alert and oriented to person, place, and time.     Comments: Sensation intact in all 4 limbs  Psychiatric:     Comments: Tearful     ED Results / Procedures / Treatments   Labs (all labs ordered are listed, but only abnormal results are displayed) Labs Reviewed  CBC WITH DIFFERENTIAL/PLATELET - Abnormal; Notable for the following components:      Result Value   RDW 16.7 (*)    All other components within normal limits  COMPREHENSIVE METABOLIC PANEL - Abnormal; Notable for the following components:   Potassium 3.3 (*)    CO2 18 (*)    BUN 5 (*)    Creatinine, Ser 1.20 (*)    GFR, Estimated 57 (*)    All other components  within normal limits  URINALYSIS, ROUTINE W REFLEX MICROSCOPIC - Abnormal; Notable for the following components:   APPearance HAZY (*)    Hgb urine dipstick LARGE (*)    Ketones, ur 5 (*)    Protein, ur 30 (*)    All other components within normal limits  CBG MONITORING, ED - Abnormal; Notable for the following components:   Glucose-Capillary 103 (*)    All other components within normal limits  LIPASE, BLOOD  I-STAT BETA HCG BLOOD, ED ([facility], [facility], [facility] ONLY)  TROPONIN I (HIGH SENSITIVITY)  TROPONIN I (HIGH SENSITIVITY)    EKG EKG Interpretation  Date/Time:  Wednesday January 17 2023 10:35:23 EDT Ventricular Rate:  82 PR Interval:  141 QRS Duration: 84 QT Interval:  367 QTC Calculation: 429 R Axis:   33 Text Interpretation: Sinus rhythm Anterior infarct, old Borderline T abnormalities, inferior leads Confirmed by Godfrey Pick [phone]) on 01/17/2023 11:30:32 AM  Radiology CT Angio Chest/Abd/Pel for Dissection W and/or W/WO  Result Date: 01/17/2023 CLINICAL DATA:  Severe abdominal pain. EXAM: CT ANGIOGRAPHY CHEST, ABDOMEN AND PELVIS TECHNIQUE: Non-contrast CT of the chest was initially obtained. Multidetector CT imaging through the chest, abdomen and pelvis was performed using the standard protocol during bolus administration of intravenous contrast. Multiplanar reconstructed images and MIPs were obtained and reviewed to evaluate the vascular anatomy. RADIATION DOSE REDUCTION: This exam was performed according to the departmental dose-optimization program which includes automated exposure control, adjustment of the mA and/or kV according to patient size and/or use of iterative reconstruction technique. CONTRAST:  159mL OMNIPAQUE IOHEXOL 350 MG/ML SOLN COMPARISON:  CT abdomen pelvis dated December 21, 2022. CT chest dated December 24, 2020. FINDINGS: CTA CHEST FINDINGS Cardiovascular: No evidence of thoracic aortic aneurysm or dissection. Normal heart size. No pericardial effusion. No pulmonary embolism.  Mediastinum/Nodes: No enlarged mediastinal, hilar, or axillary lymph nodes. Thyroid gland, trachea, and esophagus demonstrate no significant findings. Lungs/Pleura: No focal consolidation, pleural effusion, or pneumothorax. Mild linear atelectasis/scarring at the lung bases. Musculoskeletal:  No chest wall abnormality. No acute or significant osseous findings. Chronic T11 superior endplate compression deformity. Review of the MIP images confirms the above findings. CTA ABDOMEN AND PELVIS FINDINGS VASCULAR Aorta: Normal caliber aorta without aneurysm, dissection, vasculitis or significant stenosis. Celiac: Patent without evidence of aneurysm, dissection, vasculitis or significant stenosis. SMA: Patent without evidence of aneurysm, dissection, vasculitis or significant stenosis. Renals: Both renal arteries are patent without evidence of aneurysm, dissection, vasculitis, fibromuscular dysplasia or significant stenosis. IMA: Patent without evidence of aneurysm, dissection, vasculitis or significant stenosis. Inflow: Patent without evidence of aneurysm, dissection, vasculitis or significant stenosis. Veins: No obvious venous abnormality within the limitations of this arterial phase study. Review of the MIP images confirms the above findings. NON-VASCULAR Hepatobiliary: No focal liver abnormality is seen. No gallstones, gallbladder wall thickening, or biliary dilatation. Pancreas: Unremarkable. No pancreatic ductal dilatation or surrounding inflammatory changes. Spleen: Normal in size without focal abnormality. Adrenals/Urinary Tract: Adrenal glands are unremarkable. Kidneys are normal, without renal calculi, focal lesion, or hydronephrosis. Bladder is unremarkable. Stomach/Bowel: Stomach is within normal limits. Appendix appears normal. No evidence of bowel wall thickening, distention, or inflammatory changes. Lymphatic: No enlarged abdominal or pelvic lymph nodes. Reproductive: Unchanged fibroid uterus.  No adnexal mass.  Other: No free fluid or pneumoperitoneum. Musculoskeletal: No acute or significant osseous findings. Review of the MIP images confirms the above findings. IMPRESSION: 1. No acute aortic syndrome. No acute intrathoracic or intra-abdominal process. Electronically Signed   By: Titus Dubin M.D.   On: 01/17/2023 13:30    Procedures Procedures    Medications Ordered in ED Medications  capsaicin (ZOSTRIX) 0.025 % cream (has no administration in time range)  magnesium sulfate IVPB 2 g 50 mL (2 g Intravenous New Bag/Given 01/17/23 1433)  lactated ringers bolus 1,000 mL (has no administration in time range)  sodium chloride 0.9 % bolus 1,000 mL (1,000 mLs Intravenous Bolus 01/17/23 1010)  HYDROmorphone (DILAUDID) injection 1 mg (1 mg Intravenous Given 01/17/23 1009)  ondansetron (ZOFRAN) injection 4 mg (4 mg Intravenous Given 01/17/23 1015)  metoCLOPramide (REGLAN) injection 10 mg (10 mg Intravenous Given 01/17/23 1141)  droperidol (INAPSINE) 2.5 MG/ML injection 2.5 mg (2.5 mg Intravenous Given 01/17/23 1140)  iohexol (OMNIPAQUE) 350 MG/ML injection 150 mL (150 mLs Intravenous Contrast Given 01/17/23 1310)  ketorolac (TORADOL) 15 MG/ML injection 15 mg (15 mg Intravenous Given 01/17/23 1424)  alum & mag hydroxide-simeth (MAALOX/MYLANTA) 200-200-20 MG/5ML suspension 30 mL (30 mLs Oral Given 01/17/23 1427)    And  lidocaine (XYLOCAINE) 2 % viscous mouth solution 15 mL (15 mLs Oral Given 01/17/23 1426)  famotidine (PEPCID) IVPB 20 mg premix (20 mg Intravenous New Bag/Given 01/17/23 1434)  potassium chloride SA (KLOR-CON M) CR tablet 40 mEq (40 mEq Oral Given 01/17/23 1429)    ED Course/ Medical Decision Making/ A&P                             Medical Decision Making Amount and/or Complexity of Data Reviewed Labs: ordered. Radiology: ordered.  Risk OTC drugs. Prescription drug management.   JEWELYA DIRUSSO 47 y.o. presented today for severe abdominal pain. Working DDx that I considered at this time includes, but  not limited to, AAA, gastroenteritis, colitis, small bowel obstruction, appendicitis, cholecystitis, pancreatitis, nephrolithiasis, UTI, pyleonephritis, ruptured ectopic pregnancy, PID, ovarian torsion.  R/o DDx: AAA, gastroenteritis, colitis, small bowel obstruction, appendicitis, cholecystitis, pancreatitis, nephrolithiasis, UTI, pyleonephritis, ruptured ectopic pregnancy, PID, ovarian torsion: These are considered less  likely due to history of present illness and physical exam findings.  Review of prior external notes: 12/21/2022 ED provider  Unique Tests and My Interpretation:  CBC with differential: unremarkable Troponin:  CMP: Unremarkable Lipase: Unremarkable UA: Large hemoglobin CBG: 103 I-STAT beta-hCG: Negative EKG: Rate, rhythm, axis, intervals all examined and without medically relevant abnormality. ST segments without concerns for elevations CT dissection study: No signs of dissection, no acute intra-abdominal changes, no stone seen  Discussion with Independent Historian:  Mother  Discussion of Management of Tests: None  Risk: Low: based on diagnostic testing/clinical impression and treatment plan  Risk Stratification Score: None  Staffed with DIxon, MD  Plan: Patient presented for abdominal pain. On exam patient was in distress with heart rate of 99 and blood pressure 140/106 and tachypneic at 24 breaths/min.  Patient had generalized guarding but had good pulses/motor skills/sensation in all 4 limbs.  I did not palpate a pulsatile mass however patient stated her abdominal pain was severe radiating down both legs and radiating to her back.  A CT dissection study was ordered to evaluate for possible dissection along with labs.  Patient be given Dilaudid for pain management along with fluids.  Patient had reassuring CT abdomen pelvis done last month that showed a normal aorta and was ultimately an unremarkable test.  Upon chart review patient has chronic abdominal pain and  suspect this is most likely an exacerbation at this time however due to patient describing pain and presentation the CT dissection study was ordered.  Upon walking by room patient was resting comfortably with stable vitals and in no acute distress.  Patient states she was having breakthrough pain with the Dilaudid and would not go to the CT scanner without pain meds.  After speaking with Doren Custard, MD and doing chart review we agreed Reglan and droperidol would be tried as appears this is worked in the past.  This could also help with any gastroparesis possibly causing patient's symptoms as she is diabetic.  Patient stable at this time.  Patient CT was negative for any life-threatening diagnosis.  At this time patient's labs are reassuring for outpatient follow-up.  Patient blood pressure is elevated however I suspect that is most likely due to abdominal pain versus vascular pathology at this time. After speaking with Dr. Doren Custard, patient will receive some more pain meds and a GI cocktail as she has a history of acid reflux along with magnesium due to having low potassium.  On recheck patient was sitting up and in no acute distress with stable vitals.  Patient is now concerned that she does not have an appetite and states her stomach still hurts.  Patient objectively looks a lot better than initially seen as she is no longer tearful or appearing in distress.  Patient was signed out to Astrid Drafts, PA-C at 1500. Plan is patient will be discharged after receiving mag with outpatient follow up. Patient's zofran will be refilled. Patient stable at this time.         Final Clinical Impression(s) / ED Diagnoses Final diagnoses:  Generalized abdominal pain    Rx / DC Orders ED Discharge Orders          Ordered    ondansetron (ZOFRAN-ODT) 4 MG disintegrating tablet  Every 8 hours PRN        01/17/23 1510              Chuck Hint, PA-C 01/17/23 1511    Godfrey Pick, MD 01/17/23  1655  Netta Corrigan, PA-C 01/17/23 1755    Gloris Manchester, MD 01/23/23 [phone]

## 2023-01-18 ENCOUNTER — Other Ambulatory Visit: Payer: Self-pay | Admitting: Registered Nurse

## 2023-01-18 ENCOUNTER — Telehealth: Payer: Self-pay

## 2023-01-18 ENCOUNTER — Other Ambulatory Visit: Payer: Self-pay

## 2023-01-18 NOTE — Transitions of Care (Post Inpatient/ED Visit) (Cosign Needed)
   01/18/2023  Name: Jill Shaw MRN: [id number] DOB: 1976/02/02  Today's TOC FU Call Status: Today's TOC FU Call Status:: Unsuccessul Call (1st Attempt) Unsuccessful Call (1st Attempt) Date: 01/18/23  Attempted to reach the patient regarding the most recent Inpatient/ED visit.  Follow Up Plan: Additional outreach attempts will be made to reach the patient to complete the Transitions of Care (Post Inpatient/ED visit) call.   Chelsea RMA

## 2023-01-19 ENCOUNTER — Telehealth: Payer: Self-pay

## 2023-01-19 ENCOUNTER — Telehealth: Payer: Self-pay | Admitting: Registered Nurse

## 2023-01-19 ENCOUNTER — Other Ambulatory Visit: Payer: Self-pay | Admitting: Cardiology

## 2023-01-19 ENCOUNTER — Other Ambulatory Visit: Payer: Self-pay

## 2023-01-19 ENCOUNTER — Other Ambulatory Visit: Payer: Self-pay | Admitting: Registered Nurse

## 2023-01-19 DIAGNOSIS — I10 Essential (primary) hypertension: Secondary | ICD-10-CM

## 2023-01-19 DIAGNOSIS — E782 Mixed hyperlipidemia: Secondary | ICD-10-CM

## 2023-01-19 MED ORDER — OXYCODONE-ACETAMINOPHEN 10-325 MG PO TABS
1.0000 | ORAL_TABLET | ORAL | 0 refills | Status: DC | PRN
  Filled 2023-01-19: qty 180, 30d supply, fill #0

## 2023-01-19 NOTE — Telephone Encounter (Signed)
PMP was Reviewed.  Oxycodone e-scribed to pharmacy.  [name] is aware via My-Chart message.

## 2023-01-19 NOTE — Transitions of Care (Post Inpatient/ED Visit) (Cosign Needed)
   01/19/2023  Name: Jill Shaw MRN: [id number] DOB: Mar 05, 1976  Today's TOC FU Call Status: Today's TOC FU Call Status:: Successful TOC FU Call Competed TOC FU Call Complete Date: 01/19/23  Transition Care Management Follow-up Telephone Call Date of Discharge: 01/17/23 Discharge Facility: [facility]) Type of Discharge: Emergency Department Reason for ED Visit: Other: (abd  pain no app) How have you been since you were released from the hospital?: Same Any questions or concerns?: No  Items Reviewed: Did you receive and understand the discharge instructions provided?: Yes Medications obtained and verified?: Yes (Medications Reviewed) Any new allergies since your discharge?: No Dietary orders reviewed?: NA Do you have support at home?: Yes People in Home: other relative(s)  Home Care and Equipment/Supplies: Were Home Health Services Ordered?: NA Any new equipment or medical supplies ordered?: NA  Functional Questionnaire: Do you need assistance with bathing/showering or dressing?: No Do you need assistance with meal preparation?: No Do you need assistance with eating?: No Do you have difficulty maintaining continence: No Do you need assistance with getting out of bed/getting out of a chair/moving?: No Do you have difficulty managing or taking your medications?: No  Follow up appointments reviewed: PCP Follow-up appointment confirmed?: Yes Date of PCP follow-up appointment?: 02/02/23 Follow-up Provider: Rutha Bouchard [facility] Follow-up appointment confirmed?: NA Do you need transportation to your follow-up appointment?: No Do you understand care options if your condition(s) worsen?: Yes-patient verbalized understanding    SIGNATURE Renelda Loma RMA

## 2023-01-25 ENCOUNTER — Other Ambulatory Visit: Payer: Self-pay | Admitting: Nurse Practitioner

## 2023-01-25 ENCOUNTER — Other Ambulatory Visit: Payer: Self-pay

## 2023-01-25 ENCOUNTER — Encounter
Payer: Medicare Other | Attending: Physical Medicine and Rehabilitation | Admitting: Physical Medicine and Rehabilitation

## 2023-01-25 VITALS — BP 179/82 | HR 91 | Temp 98.3°F | Ht 64.0 in | Wt 280.0 lb

## 2023-01-25 DIAGNOSIS — G629 Polyneuropathy, unspecified: Secondary | ICD-10-CM | POA: Diagnosis not present

## 2023-01-25 DIAGNOSIS — G894 Chronic pain syndrome: Secondary | ICD-10-CM | POA: Insufficient documentation

## 2023-01-25 DIAGNOSIS — K3184 Gastroparesis: Secondary | ICD-10-CM

## 2023-01-25 DIAGNOSIS — I10 Essential (primary) hypertension: Secondary | ICD-10-CM

## 2023-01-25 DIAGNOSIS — E1143 Type 2 diabetes mellitus with diabetic autonomic (poly)neuropathy: Secondary | ICD-10-CM | POA: Insufficient documentation

## 2023-01-25 MED ORDER — CAPSAICIN-CLEANSING GEL 8 % EX KIT
4.0000 | PACK | Freq: Once | CUTANEOUS | Status: AC
Start: 2023-01-25 — End: 2023-01-25
  Administered 2023-01-25: 4 via TOPICAL

## 2023-01-25 MED ORDER — ACETAMINOPHEN-CODEINE 300-30 MG PO TABS
1.0000 | ORAL_TABLET | Freq: Three times a day (TID) | ORAL | 0 refills | Status: DC | PRN
  Filled 2023-01-25: qty 90, 30d supply, fill #0

## 2023-01-25 MED ORDER — OXYCODONE-ACETAMINOPHEN 10-325 MG PO TABS
1.0000 | ORAL_TABLET | Freq: Every day | ORAL | 0 refills | Status: DC | PRN
  Filled 2023-01-25: qty 150, 32d supply, fill #0
  Filled ????-??-??: fill #0

## 2023-01-25 NOTE — Progress Notes (Signed)
Subjective:    Patient ID: Jill HighmanJulie M Shaw, female    DOB: 1976/04/21, 47 y.o.   MRN: [id number]   HPI:     Jill HighmanJulie M Shaw is a 47 y.o. female who returns for f/u of inflammatory gastritis, chronic pain, diabetic peripheral neuropathy, and insomnia.   1) Insomnia: -She continues to experience insomnia at night. Asks about Ambien and I discussed that this is an addictive medication and there are other safer options we can try first that can also help with her pain.  -She is currently not taking either of these medications, or Amitriptyline or Trazodone.  -She takes Requip for resltless legs but this does not help. -She is still sleeping very poorly -She does use screens before bed time  2) Back pain secondary to lumbar radiculitis.  -she says she did not mean to request an early refill today, that she said this wrong, she was trying to explain to [place] -she has received 2 warnings for marijuana in her urine sample as well as for not bringing the number of correct pills to her appointments -she asks if tizanidine can be taken more than 4 times per day -She states her pain is located in her lower back radiating into her bilateral lower extremities and bilateral knee pain. She denies falling. She rates her pain 9. Her current exercise regime is walking.  -Ms. Cuen Morphine equivalent is 20.00 MME.  -She was hospitalized for serotonin syndrome after use of Savella and Cymbalta.  -Back pain has been severe -She requests a renewal of her handicap placard as she is unable to walk 200 feet without stopping to rest.  -Her pain has been better controlled with Norco -she would like to get another XR given the chronicity of her pain.  -unable to work due to the severity of her pain -she used to work in [employer] as Conservation officer, naturecashier and in other roles as needed  3) Diabetic peripheral neuropathy -she would like to try tens unit today -she would like to repeat Qutenza today -she needs refill of her  oxycodone -she is currently very limited in her mobility and needs her son's assistance 24/7 at home, she needs a not indicating this for her son to give at work. -she asks whether Qutenza can be administered more frequently than every 3 months, it has been helping a lot.  -she finds great benefits in her legs and found benefit in her hands as well but she kept forgetting and touching her face and this caused her face to birn -she is visiting her mother who has a pneumonia and asks whether her medications can be filled early -she asks whether her oxycodone can be released early as she is going on vacation next week.  -She tolerated Qutenza well and this provided 2 weeks of good relief, benefit started right away.  -Her peripheral neuropathy is worse in her bilateral lower extremities, especially the dorsum and soles of both feet, including the toes.  -now present in her hands as well- she has been dropping objects due to her neuropathy- present on the dorsal and ventral aspects of her hands -she is interested in following with a dietician -she is interested in medicines for her diabetes -she is trying to lose weight  -needs handicap placard  4) Right shoulder and arm pain -notes limited range of motion in her shoulder -does have pain radiating into right arm from neck and arm feels heavy at times  5) Inflammatory gastritis -has been back and  forth to the hospital as this has been really hurting her. She was given pain medication in the hospital. She was given medication for emesis.  -flexeril is making her sicker.  --patient says she has recently been diagnosed with this condition and pain has been severe. -she has her gastroenterologist recommended that she discuss with [place] increasing her Percocet to better control her pain -she is currently prescribed 3 Percocet per day but feels she would benefit from up to 5 per day  6) GERD -she can have severe reflex at times -she was started on  pepcid for a short time period to help with this.   7) Morbid obesity -BMI 47.03 -weight is 274 lbs -she lose 40 lbs when she was sick since she could not tolerate food but she has gained a lot of this back -she asks about supplement she can take for weight loss  8) Migraines: -she has been having headaches and asks if she can get a scan of her head.   9) Diabetic gastroparesis: -this has been severe and she was recommended by ED physician to decrease her opioid usage, but she is nervous to do so because she is in so much pain -she asks about trying tylenol with codeine  Pain Inventory Average Pain 9 Pain Right Now 9 My pain is sharp, burning, tingling and aching  In the last 24 hours, has pain interfered with the following? General activity 0 Relation with others 0 Enjoyment of life 5 What TIME of day is your pain at its worst? morning , daytime, evening and night Sleep (in general) Poor  Pain is worse with: walking, bending, sitting, inactivity, standing and some activites Pain improves with: heat/ice and medication Relief from Meds: 10  Family History  Problem Relation Age of Onset   Diabetes Mother    Hypertension Mother    Migraines Paternal Grandfather    Colon cancer Neg Hx    Esophageal cancer Neg Hx    Stomach cancer Neg Hx    Rectal cancer Neg Hx    Social History   Socioeconomic History   Marital status: Divorced    Spouse name: Not on file   Number of children: 3   Years of education: Not on file   Highest education level: Not on file  Occupational History   Occupation: unemployed  Tobacco Use   Smoking status: Some Days    Packs/day: 0.25    Years: 26.00    Additional pack years: 0.00    Total pack years: 6.50    Types: Cigarettes    Last attempt to quit: 09/13/2020    Years since quitting: 2.3   Smokeless tobacco: Never   Tobacco comments:    4-5 cigarettes/day  Vaping Use   Vaping Use: Never used  Substance and Sexual Activity   Alcohol  use: No   Drug use: No   Sexual activity: Not Currently    Birth control/protection: None  Other Topics Concern   Not on file  Social History Narrative   Right Handed   Lives in a one story apartment, but lives on the second floor   Drinks caffeine once in awhile   Social Determinants of Health   Financial Resource Strain: Not on file  Food Insecurity: Food Insecurity Present (12/08/2021)   Hunger Vital Sign    Worried About Running Out of Food in the Last Year: Never true    Ran Out of Food in the Last Year: Sometimes true  Transportation Needs:  No Transportation Needs (12/08/2021)   PRAPARE - Administrator, Civil Service (Medical): No    Lack of Transportation (Non-Medical): No  Physical Activity: Not on file  Stress: Not on file  Social Connections: Not on file   Past Surgical History:  Procedure Laterality Date   CESAREAN SECTION     x 1. for twins   DILATION AND CURETTAGE OF UTERUS N/A 08/20/2019   Procedure: DILATATION AND CURETTAGE;  Surgeon: Allie Bossier, MD;  Location: [facility] OR;  Service: Gynecology;  Laterality: N/A;   ENDOMETRIAL ABLATION N/A 08/20/2019   Procedure: Minerva Ablation;  Surgeon: Allie Bossier, MD;  Location: [facility] OR;  Service: Gynecology;  Laterality: N/A;   EYE SURGERY Bilateral    laser right and cataract removed left eye   LEFT HEART CATH AND CORONARY ANGIOGRAPHY N/A 08/30/2021   Procedure: LEFT HEART CATH AND CORONARY ANGIOGRAPHY;  Surgeon: Elder Negus, MD;  Location: [facility];  Service: Cardiovascular;  Laterality: N/A;   RADIOLOGY WITH ANESTHESIA N/A 09/16/2019   Procedure: MRI WITH ANESTHESIA   L SPINE WITHOUT CONTRAST, T SPINE WITHOUT CONTRAST , CERVICAL WITHOUT CONTRAST;  Surgeon: Radiologist, Medication, MD;  Location: [facility] OR;  Service: Radiology;  Laterality: N/A;   TUBAL LIGATION     interval BTL   UPPER GI ENDOSCOPY  07/2017   Past Surgical History:  Procedure Laterality Date   CESAREAN SECTION     x 1. for twins    DILATION AND CURETTAGE OF UTERUS N/A 08/20/2019   Procedure: DILATATION AND CURETTAGE;  Surgeon: Allie Bossier, MD;  Location: [facility];  Service: Gynecology;  Laterality: N/A;   ENDOMETRIAL ABLATION N/A 08/20/2019   Procedure: Minerva Ablation;  Surgeon: Allie Bossier, MD;  Location: [facility];  Service: Gynecology;  Laterality: N/A;   EYE SURGERY Bilateral    laser right and cataract removed left eye   LEFT HEART CATH AND CORONARY ANGIOGRAPHY N/A 08/30/2021   Procedure: LEFT HEART CATH AND CORONARY ANGIOGRAPHY;  Surgeon: Elder Negus, MD;  Location: [facility];  Service: Cardiovascular;  Laterality: N/A;   RADIOLOGY WITH ANESTHESIA N/A 09/16/2019   Procedure: MRI WITH ANESTHESIA   L SPINE WITHOUT CONTRAST, T SPINE WITHOUT CONTRAST , CERVICAL WITHOUT CONTRAST;  Surgeon: Radiologist, Medication, MD;  Location: [facility];  Service: Radiology;  Laterality: N/A;   TUBAL LIGATION     interval BTL   UPPER GI ENDOSCOPY  07/2017   Past Medical History:  Diagnosis Date   Anemia    Anxiety    Arthritis    knees, hands   Asthma    Chronic diastolic (congestive) heart failure    COPD (chronic obstructive pulmonary disease)    Diabetes mellitus    DKA (diabetic ketoacidosis)    Dysfunctional uterine bleeding    Gastritis    GERD (gastroesophageal reflux disease)    Hypertension    Intractable nausea and vomiting 03/23/2021   Neuromuscular disorder    neuropathy feet   Seizures 09/12/2017   pt states r/t stress and blood sugar - no meds last one 4 months ago, not seen neurologist   Sickle cell trait    Smoker    Vitamin D deficiency 10/2019   Wears glasses    BP (!) 179/82   Pulse 91   Temp 98.3 F (36.8 C)   Ht 5\' 4"  (1.626 m)   Wt 280 lb (127 kg)   SpO2 98%   BMI 48.06 kg/m  Opioid Risk Score:   Fall Risk Score:  `1  Depression screen [facility] 2/9     01/25/2023   10:52 AM 01/04/2023    3:22 PM 12/25/2022   10:18 AM 10/24/2022   10:03 AM 09/05/2022    2:45 PM 07/17/2022    11:04 AM 07/04/2022    8:58 AM  Depression screen PHQ 2/9  Decreased Interest 0 0 2 0 0 0 0  Down, Depressed, Hopeless 0 0 2 0 0 0 0  PHQ - 2 Score 0 0 4 0 0 0 0  Altered sleeping  3   2    Tired, decreased energy  3   2    Change in appetite     0    Feeling bad or failure about yourself      0    Trouble concentrating     0    Moving slowly or fidgety/restless     0    Suicidal thoughts     0    PHQ-9 Score  6   4       Review of Systems  Constitutional:  Positive for diaphoresis and unexpected weight change.  Respiratory:  Positive for shortness of breath and wheezing.   Gastrointestinal:  Positive for abdominal pain, constipation and nausea.  Musculoskeletal:  Positive for arthralgias, back pain, gait problem and myalgias.  Neurological:  Positive for dizziness, weakness and numbness.  Hematological:  Bruises/bleeds easily.  Psychiatric/Behavioral:  Positive for confusion and decreased concentration. The patient is nervous/anxious.   All other systems reviewed and are negative.      Objective:  Not performed     Assessment & Plan:  1. Lumbar Radiculitis: Continue current medication regimen. Continue HEP as Tolerated. Provided with handicap placard. XR ordered. Note for work provided via Clinical cytogeneticist.  2. Fibromyalgia: Continue HEP as Tolerated. Continue current Medication regimen. Continue to Monitor.  3. Bilateral Knee Pain: Right knee is currently worst, XR ordered and will call with results.  Continue to Monitor.  4. Chronic Pain Syndrome: Continue Percocet. Discussed that we cannot send early refills if medications are lost. Increase percocet to 6 tabs per day prn. Will send final warning letter to patient today to say that we do not permit early refills, discussed this with her pharmacy as well -provided note explaining her inability to work due to her pain 5. Insomnia: -Try to go outside near sunrise -Get exercise during the day.  -Discussed good sleep hygiene: turning  off all devices an hour before bedtime.  -Chamomile tea with dinner.  -Melatonin did for help. -warm bath or shower before bed. -apply lavender oil for forehead at night  6. Diabetic peripheral neuropathy -provided note for son to give to work to indicate that patient needs his assistance 24/7 due to her mobility deficits -checked with Qutenza rep and medicaid/medicare will not cover Qutenza more frequently than every 3 months  -refilled tizanidine -Discussed Qutenza as an option for neuropathic pain control. Discussed that this is a capsaicin patch, stronger than capsaicin cream. Discussed that it is currently approved for diabetic peripheral neuropathy and post-herpetic neuralgia, but that it has also shown benefit in treating other forms of neuropathy. Provided patient with link to site to learn more about the patch: https://www.clark.biz/. Discussed that the patch would be placed in office and benefits usually last 3 months. Discussed that unintended exposure to capsaicin can cause severe irritation of eyes, mucous membranes, respiratory tract, and skin, but that Qutenza is a local  treatment and does not have the systemic side effects of other nerve medications. Discussed that there may be pain, itching, erythema, and decreased sensory function associated with the application of Qutenza. Side effects usually subside within 1 week. A cold pack of analgesic medications can help with these side effects. Blood pressure can also be increased due to pain associated with administration of the patch.   4 patches of Qutenza was applied to the area of pain. Ice packs were applied during the procedure to ensure patient comfort. Blood pressure was monitored every 15 minutes. The patient tolerated the procedure well. Post-procedure instructions were given and follow-up has been scheduled.    Prescribed Zynex Nexwave  7. Right sided shoulder and arm pain -discussed that could be a component or frozen  shoulder and/or cervical radiculitis -will obtain right shoulder XR and cervical spine XR and call when results are available  8. Obesity: -Educated that current weight is 274 lbs and current BMI is 47.03 -shared foods that can assist in weight loss: 1) leafy greens- high in fiber and nutrients 2) dark chocolate- improves metabolism (if prefer sweetened, best to sweeten with honey instead of sugar).  3) cruciferous vegetables- high in fiber and protein 4) full fat yogurt: high in healthy fat, protein, calcium, and probiotics 5) apples- high in a variety of phytochemicals 6) nuts- high in fiber and protein that increase feelings of fullness 7) grapefruit: rich in nutrients, antioxidants, and fiber (not to be taken with anticoagulation) 8) beans- high in protein and fiber 9) salmon- has high quality protein and healthy fats 10) green tea- rich in polyphenols 11) eggs- rich in choline and vitamin D 12) tuna- high protein, boosts metabolism 13) avocado- decreases visceral abdominal fat 14) chicken (pasture raised): high in protein and iron 15) blueberries- reduce abdominal fat and cholesterol 16) whole grains- decreases calories retained during digestion, speeds metabolism 17) chia seeds- curb appetite 18) chilies- increases fat metabolism  -Discussed supplements that can be used:  1) Metatrim 400mg  BID 30 minutes before breakfast and dinner  2) Sphaeranthus indicus and Garcinia mangostana (combinations of these and #1 can be found in capsicum and zychrome  3) green coffee bean extract 400mg  twice per day or Irvingia (african mango) 150 to 300mg  twice per day.    9) Gastroesophageal reflux disease  -discussed the interaction between Tizanidine and Pepcid and that their additive effects could cause hypotension and bradycardia. -recommended eating small meals -recommended avoiding acidic and spicy foods -recommended apple cider vinegar in a cup of water before meals.   10) Inflammatory  gastritis: -recommended following up with GI  11) Migraines: -Head CT ordered  12) Diabetic gastroparesis: -wean Percocet to 5 tabs per day -add tylenol with codeine TID to replace this, assist with wean  40 minutes spent in discussion of risks and benefits of Qutenza and obtaining informed consent, discussion of q90 day follow-up and expectation of improvement in pain with each repeat application, discussion of her gastroparesis, recommendation to wean opioids gradually to minimize gastroparesis risk while continuing to help her pain, decrease oxycodone to 5 tabs per day, add tylenol with codeine three tabs per day

## 2023-01-28 ENCOUNTER — Other Ambulatory Visit: Payer: Self-pay | Admitting: Nurse Practitioner

## 2023-01-29 ENCOUNTER — Telehealth: Payer: Self-pay

## 2023-01-29 NOTE — Progress Notes (Signed)
   Care Guide Note  01/29/2023 Name: [name] MRN: [id number] DOB: 09/17/1976  Referred by: Ivonne Andrew, NP Reason for referral : Care Coordination (outreach to schedule with Pharm d)   Jill Shaw is a 47 y.o. year old female who is a primary care patient of Ivonne Andrew, NP. [name] was referred to the pharmacist for assistance related to DM.    An unsuccessful telephone outreach was attempted today to contact the patient who was referred to the pharmacy team for assistance with medication management. Additional attempts will be made to contact the patient.   Penne Lash, RMA Care Guide [facility]  [place], [place] [address] Direct Dial: [phone] [email or web address]

## 2023-02-02 ENCOUNTER — Inpatient Hospital Stay: Payer: Medicare Other | Admitting: Nurse Practitioner

## 2023-02-05 ENCOUNTER — Other Ambulatory Visit (HOSPITAL_COMMUNITY): Payer: Self-pay

## 2023-02-05 ENCOUNTER — Encounter (HOSPITAL_BASED_OUTPATIENT_CLINIC_OR_DEPARTMENT_OTHER): Payer: Medicare Other | Admitting: Physical Medicine and Rehabilitation

## 2023-02-05 ENCOUNTER — Other Ambulatory Visit: Payer: Self-pay

## 2023-02-05 ENCOUNTER — Encounter: Payer: Self-pay | Admitting: Physical Medicine and Rehabilitation

## 2023-02-05 ENCOUNTER — Other Ambulatory Visit: Payer: Self-pay | Admitting: Nurse Practitioner

## 2023-02-05 DIAGNOSIS — G894 Chronic pain syndrome: Secondary | ICD-10-CM

## 2023-02-05 MED ORDER — DICYCLOMINE HCL 20 MG PO TABS
20.0000 mg | ORAL_TABLET | Freq: Two times a day (BID) | ORAL | 0 refills | Status: DC
  Filled 2023-02-05 (×3): qty 20, 10d supply, fill #0

## 2023-02-05 NOTE — Telephone Encounter (Signed)
Please advise KH 

## 2023-02-06 ENCOUNTER — Telehealth: Payer: Self-pay

## 2023-02-06 ENCOUNTER — Encounter: Payer: Self-pay | Admitting: Physical Medicine and Rehabilitation

## 2023-02-06 ENCOUNTER — Other Ambulatory Visit: Payer: Self-pay | Admitting: Physical Medicine and Rehabilitation

## 2023-02-06 ENCOUNTER — Other Ambulatory Visit (HOSPITAL_COMMUNITY): Payer: Self-pay

## 2023-02-06 ENCOUNTER — Other Ambulatory Visit: Payer: Self-pay

## 2023-02-06 MED ORDER — ACETAMINOPHEN-CODEINE 300-60 MG PO TABS
1.0000 | ORAL_TABLET | Freq: Three times a day (TID) | ORAL | 0 refills | Status: DC | PRN
  Filled 2023-02-06 – 2023-02-07 (×3): qty 30, 10d supply, fill #0

## 2023-02-06 MED ORDER — TIZANIDINE HCL 4 MG PO TABS
4.0000 mg | ORAL_TABLET | Freq: Four times a day (QID) | ORAL | 0 refills | Status: DC | PRN
  Filled 2023-02-06: qty 30, 8d supply, fill #0

## 2023-02-06 NOTE — Progress Notes (Signed)
Subjective:    Patient ID: Jill Shaw, female    DOB: 09-30-1976, 47 y.o.   MRN: [id number]   HPI:     Jill Shaw is a 47 y.o. female who returns for f/u of inflammatory gastritis, chronic pain, diabetic peripheral neuropathy, and insomnia.   1) Insomnia: -She continues to experience insomnia at night. Asks about Ambien and I discussed that this is an addictive medication and there are other safer options we can try first that can also help with her pain.  -She is currently not taking either of these medications, or Amitriptyline or Trazodone.  -She takes Requip for resltless legs but this does not help. -She is still sleeping very poorly -She does use screens before bed time  2) Back pain secondary to lumbar radiculitis.  -she says she did not mean to request an early refill today, that she said this wrong, she was trying to explain to [place] -she has received 2 warnings for marijuana in her urine sample as well as for not bringing the number of correct pills to her appointments -she asks if tizanidine can be taken more than 4 times per day -She states her pain is located in her lower back radiating into her bilateral lower extremities and bilateral knee pain. She denies falling. She rates her pain 9. Her current exercise regime is walking.  -[name] Morphine equivalent is 20.00 MME.  -She was hospitalized for serotonin syndrome after use of Savella and Cymbalta.  -Back pain has been severe -She requests a renewal of her handicap placard as she is unable to walk 200 feet without stopping to rest.  -Her pain has been better controlled with Norco -she would like to get another XR given the chronicity of her pain.  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  3) Diabetic peripheral neuropathy -she would like to try tens unit  -she would like to repeat Qutenza  -she is not getting enough relief  from tylenol #3 -she needs refill of her oxycodone -she is currently very limited in her mobility and needs her son's assistance 24/7 at home, she needs a not indicating this for her son to give at work. -she asks whether Qutenza can be administered more frequently than every 3 months, it has been helping a lot.  -she finds great benefits in her legs and found benefit in her hands as well but she kept forgetting and touching her face and this caused her face to birn -she is visiting her mother who has a pneumonia and asks whether her medications can be filled early -she asks whether her oxycodone can be released early as she is going on vacation next week.  -She tolerated Qutenza well and this provided 2 weeks of good relief, benefit started right away.  -Her peripheral neuropathy is worse in her bilateral lower extremities, especially the dorsum and soles of both feet, including the toes.  -now present in her hands as well- she has been dropping objects due to her neuropathy- present on the dorsal and ventral aspects of her hands -she is interested in following with a dietician -she is interested in medicines for her diabetes -she is trying to lose weight  -needs handicap placard  4) Right shoulder and arm pain -notes limited range of motion in her shoulder -does have pain radiating into right arm from neck and arm feels heavy at  times  5) Inflammatory gastritis -has been back and forth to the hospital as this has been really hurting her. She was given pain medication in the hospital. She was given medication for emesis.  -flexeril is making her sicker.  --patient says she has recently been diagnosed with this condition and pain has been severe. -she has her gastroenterologist recommended that she discuss with [place] increasing her Percocet to better control her pain -she is currently prescribed 3 Percocet per day but feels she would benefit from up to 5 per day  6) GERD -she can have severe  reflex at times -she was started on pepcid for a short time period to help with this.   7) Morbid obesity -BMI 47.03 -weight is 274 lbs -she lose 40 lbs when she was sick since she could not tolerate food but she has gained a lot of this back -she asks about supplement she can take for weight loss  8) Migraines: -she has been having headaches and asks if she can get a scan of her head.   9) Diabetic gastroparesis: -this has been severe and she was recommended by ED physician to decrease her opioid usage, but she is nervous to do so because she is in so much pain -she asks about trying tylenol with codeine  Pain Inventory Average Pain 9 Pain Right Now 9 My pain is sharp, burning, tingling and aching  In the last 24 hours, has pain interfered with the following? General activity 0 Relation with others 0 Enjoyment of life 5 What TIME of day is your pain at its worst? morning , daytime, evening and night Sleep (in general) Poor  Pain is worse with: walking, bending, sitting, inactivity, standing and some activites Pain improves with: heat/ice and medication Relief from Meds: 10  Family History  Problem Relation Age of Onset   Diabetes Mother    Hypertension Mother    Migraines Paternal Grandfather    Colon cancer Neg Hx    Esophageal cancer Neg Hx    Stomach cancer Neg Hx    Rectal cancer Neg Hx    Social History   Socioeconomic History   Marital status: Divorced    Spouse name: Not on file   Number of children: 3   Years of education: Not on file   Highest education level: Not on file  Occupational History   Occupation: unemployed  Tobacco Use   Smoking status: Some Days    Packs/day: 0.25    Years: 26.00    Additional pack years: 0.00    Total pack years: 6.50    Types: Cigarettes    Last attempt to quit: 09/13/2020    Years since quitting: 2.4   Smokeless tobacco: Never   Tobacco comments:    4-5 cigarettes/day  Vaping Use   Vaping Use: Never used   Substance and Sexual Activity   Alcohol use: No   Drug use: No   Sexual activity: Not Currently    Birth control/protection: None  Other Topics Concern   Not on file  Social History Narrative   Right Handed   Lives in a one story apartment, but lives on the second floor   Drinks caffeine once in awhile   Social Determinants of Health   Financial Resource Strain: Not on file  Food Insecurity: Food Insecurity Present (12/08/2021)   Hunger Vital Sign    Worried About Running Out of Food in the Last Year: Never true    Ran Out of Food  in the Last Year: Sometimes true  Transportation Needs: No Transportation Needs (12/08/2021)   PRAPARE - Administrator, Civil Service (Medical): No    Lack of Transportation (Non-Medical): No  Physical Activity: Not on file  Stress: Not on file  Social Connections: Not on file   Past Surgical History:  Procedure Laterality Date   CESAREAN SECTION     x 1. for twins   DILATION AND CURETTAGE OF UTERUS N/A 08/20/2019   Procedure: DILATATION AND CURETTAGE;  Surgeon: Allie Bossier, MD;  Location: [facility] OR;  Service: Gynecology;  Laterality: N/A;   ENDOMETRIAL ABLATION N/A 08/20/2019   Procedure: Minerva Ablation;  Surgeon: Allie Bossier, MD;  Location: [facility] OR;  Service: Gynecology;  Laterality: N/A;   EYE SURGERY Bilateral    laser right and cataract removed left eye   LEFT HEART CATH AND CORONARY ANGIOGRAPHY N/A 08/30/2021   Procedure: LEFT HEART CATH AND CORONARY ANGIOGRAPHY;  Surgeon: Elder Negus, MD;  Location: [facility];  Service: Cardiovascular;  Laterality: N/A;   RADIOLOGY WITH ANESTHESIA N/A 09/16/2019   Procedure: MRI WITH ANESTHESIA   L SPINE WITHOUT CONTRAST, T SPINE WITHOUT CONTRAST , CERVICAL WITHOUT CONTRAST;  Surgeon: Radiologist, Medication, MD;  Location: [facility];  Service: Radiology;  Laterality: N/A;   TUBAL LIGATION     interval BTL   UPPER GI ENDOSCOPY  07/2017   Past Surgical History:  Procedure Laterality  Date   CESAREAN SECTION     x 1. for twins   DILATION AND CURETTAGE OF UTERUS N/A 08/20/2019   Procedure: DILATATION AND CURETTAGE;  Surgeon: Allie Bossier, MD;  Location: [facility];  Service: Gynecology;  Laterality: N/A;   ENDOMETRIAL ABLATION N/A 08/20/2019   Procedure: Minerva Ablation;  Surgeon: Allie Bossier, MD;  Location: [facility];  Service: Gynecology;  Laterality: N/A;   EYE SURGERY Bilateral    laser right and cataract removed left eye   LEFT HEART CATH AND CORONARY ANGIOGRAPHY N/A 08/30/2021   Procedure: LEFT HEART CATH AND CORONARY ANGIOGRAPHY;  Surgeon: Elder Negus, MD;  Location: [facility];  Service: Cardiovascular;  Laterality: N/A;   RADIOLOGY WITH ANESTHESIA N/A 09/16/2019   Procedure: MRI WITH ANESTHESIA   L SPINE WITHOUT CONTRAST, T SPINE WITHOUT CONTRAST , CERVICAL WITHOUT CONTRAST;  Surgeon: Radiologist, Medication, MD;  Location: [facility];  Service: Radiology;  Laterality: N/A;   TUBAL LIGATION     interval BTL   UPPER GI ENDOSCOPY  07/2017   Past Medical History:  Diagnosis Date   Anemia    Anxiety    Arthritis    knees, hands   Asthma    Chronic diastolic (congestive) heart failure    COPD (chronic obstructive pulmonary disease)    Diabetes mellitus    DKA (diabetic ketoacidosis)    Dysfunctional uterine bleeding    Gastritis    GERD (gastroesophageal reflux disease)    Hypertension    Intractable nausea and vomiting 03/23/2021   Neuromuscular disorder    neuropathy feet   Seizures 09/12/2017   pt states r/t stress and blood sugar - no meds last one 4 months ago, not seen neurologist   Sickle cell trait    Smoker    Vitamin D deficiency 10/2019   Wears glasses    There were no vitals taken for this visit.  Opioid Risk Score:   Fall Risk Score:  `1  Depression screen [facility] 2/9     01/25/2023  10:52 AM 01/04/2023    3:22 PM 12/25/2022   10:18 AM 10/24/2022   10:03 AM 09/05/2022    2:45 PM 07/17/2022   11:04 AM 07/04/2022    8:58 AM   Depression screen PHQ 2/9  Decreased Interest 0 0 2 0 0 0 0  Down, Depressed, Hopeless 0 0 2 0 0 0 0  PHQ - 2 Score 0 0 4 0 0 0 0  Altered sleeping  3   2    Tired, decreased energy  3   2    Change in appetite     0    Feeling bad or failure about yourself      0    Trouble concentrating     0    Moving slowly or fidgety/restless     0    Suicidal thoughts     0    PHQ-9 Score  6   4       Review of Systems  Constitutional:  Positive for diaphoresis and unexpected weight change.  Respiratory:  Positive for shortness of breath and wheezing.   Gastrointestinal:  Positive for abdominal pain, constipation and nausea.  Musculoskeletal:  Positive for arthralgias, back pain, gait problem and myalgias.  Neurological:  Positive for dizziness, weakness and numbness.  Hematological:  Bruises/bleeds easily.  Psychiatric/Behavioral:  Positive for confusion and decreased concentration. The patient is nervous/anxious.   All other systems reviewed and are negative.      Objective:  Not performed     Assessment & Plan:  1. Lumbar Radiculitis: Continue current medication regimen. Continue HEP as Tolerated. Provided with handicap placard. XR ordered. Note for work provided via Clinical cytogeneticist.  2. Fibromyalgia: Continue HEP as Tolerated. Continue current Medication regimen. Continue to Monitor.  3. Bilateral Knee Pain: Right knee is currently worst, XR ordered and will call with results.  Continue to Monitor.  4. Chronic Pain Syndrome: Continue Percocet. Discussed that we cannot send early refills if medications are lost. Increase percocet to 6 tabs per day prn. Will send final warning letter to patient today to say that we do not permit early refills, discussed this with her pharmacy as well -provided note explaining her inability to work due to her pain 5. Insomnia: -Try to go outside near sunrise -Get exercise during the day.  -Discussed good sleep hygiene: turning off all devices an hour before  bedtime.  -Chamomile tea with dinner.  -Melatonin did for help. -warm bath or shower before bed. -apply lavender oil for forehead at night  6. Diabetic peripheral neuropathy -provided note for son to give to work to indicate that patient needs his assistance 24/7 due to her mobility deficits -checked with Qutenza rep and medicaid/medicare will not cover Qutenza more frequently than every 3 months  -refilled tizanidine -Discussed Qutenza as an option for neuropathic pain control. Discussed that this is a capsaicin patch, stronger than capsaicin cream. Discussed that it is currently approved for diabetic peripheral neuropathy and post-herpetic neuralgia, but that it has also shown benefit in treating other forms of neuropathy. Provided patient with link to site to learn more about the patch: https://www.clark.biz/. Discussed that the patch would be placed in office and benefits usually last 3 months. Discussed that unintended exposure to capsaicin can cause severe irritation of eyes, mucous membranes, respiratory tract, and skin, but that Qutenza is a local treatment and does not have the systemic side effects of other nerve medications. Discussed that there may be pain, itching, erythema, and  decreased sensory function associated with the application of Qutenza. Side effects usually subside within 1 week. A cold pack of analgesic medications can help with these side effects. Blood pressure can also be increased due to pain associated with administration of the patch.   -discussed positive response to last Qutenza treatment  -switch from tylenol #3 to tylenol #4 TID PRN given lack of relief from former  -discussed benefit from TENS unit in office  Prescribed Zynex Nexwave  7. Right sided shoulder and arm pain -discussed that could be a component or frozen shoulder and/or cervical radiculitis -will obtain right shoulder XR and cervical spine XR and call when results are available  8.  Obesity: -Educated that current weight is 274 lbs and current BMI is 47.03 -shared foods that can assist in weight loss: 1) leafy greens- high in fiber and nutrients 2) dark chocolate- improves metabolism (if prefer sweetened, best to sweeten with honey instead of sugar).  3) cruciferous vegetables- high in fiber and protein 4) full fat yogurt: high in healthy fat, protein, calcium, and probiotics 5) apples- high in a variety of phytochemicals 6) nuts- high in fiber and protein that increase feelings of fullness 7) grapefruit: rich in nutrients, antioxidants, and fiber (not to be taken with anticoagulation) 8) beans- high in protein and fiber 9) salmon- has high quality protein and healthy fats 10) green tea- rich in polyphenols 11) eggs- rich in choline and vitamin D 12) tuna- high protein, boosts metabolism 13) avocado- decreases visceral abdominal fat 14) chicken (pasture raised): high in protein and iron 15) blueberries- reduce abdominal fat and cholesterol 16) whole grains- decreases calories retained during digestion, speeds metabolism 17) chia seeds- curb appetite 18) chilies- increases fat metabolism  -Discussed supplements that can be used:  1) Metatrim 400mg  BID 30 minutes before breakfast and dinner  2) Sphaeranthus indicus and Garcinia mangostana (combinations of these and #1 can be found in capsicum and zychrome  3) green coffee bean extract 400mg  twice per day or Irvingia (african mango) 150 to 300mg  twice per day.    9) Gastroesophageal reflux disease  -discussed the interaction between Tizanidine and Pepcid and that their additive effects could cause hypotension and bradycardia. -recommended eating small meals -recommended avoiding acidic and spicy foods -recommended apple cider vinegar in a cup of water before meals.   10) Inflammatory gastritis: -recommended following up with GI  11) Migraines: -Head CT ordered  12) Diabetic gastroparesis: -wean Percocet to 5  tabs per day -add tylenol with codeine TID to replace this, assist with wean  5 minutes spent in discussion of her lack of relief from tylenol #3, switching to tylenol #4, discussing her positive response to last Qutenza treatment and that TENS unit helped her pain

## 2023-02-06 NOTE — Telephone Encounter (Signed)
Jill Shaw called back in stating that she threw away her tylenol #3, I advised that she was not suppose to throw away her medication, and she also advised that the tizanadine was to strong as well

## 2023-02-07 ENCOUNTER — Other Ambulatory Visit: Payer: Self-pay

## 2023-02-07 ENCOUNTER — Encounter: Payer: Self-pay | Admitting: Physical Medicine and Rehabilitation

## 2023-02-07 ENCOUNTER — Ambulatory Visit (INDEPENDENT_AMBULATORY_CARE_PROVIDER_SITE_OTHER): Payer: Medicare Other | Admitting: General Practice

## 2023-02-07 VITALS — BP 172/85 | HR 82 | Ht 64.0 in | Wt 274.0 lb

## 2023-02-07 DIAGNOSIS — N938 Other specified abnormal uterine and vaginal bleeding: Secondary | ICD-10-CM

## 2023-02-07 DIAGNOSIS — Z3042 Encounter for surveillance of injectable contraceptive: Secondary | ICD-10-CM

## 2023-02-07 MED ORDER — MEDROXYPROGESTERONE ACETATE 150 MG/ML IM SUSP
150.0000 mg | Freq: Once | INTRAMUSCULAR | Status: AC
Start: 2023-02-07 — End: 2023-02-07
  Administered 2023-02-07: 150 mg via INTRAMUSCULAR

## 2023-02-07 NOTE — Progress Notes (Signed)
[name] here for Depo-Provera Injection. Injection administered without complication. Patient reports continued bleeding about every day despite Depo and taking about 7 Megace pills a day. Discussed scheduling a follow up ultrasound. She is hesitant towards ultrasound because she doesn't want it done when she is bleeding. Discussed with patient her last ultrasound was 4 years ago and without an ultrasound, it's hard for [place] to know why she is bleeding. Scheduled ultrasound for 5/6 per patient request.  Patient will return in 3 months for next injection between July 10 and July 24 or sooner if needed based off ultrasound reports. Patient's blood pressure was very high today despite taking prescribed blood pressure medications. Recommended she reach out to her PCP because at levels that high, we get concerned about risk of stroke or heart attack. Patient verbalized understanding. Next annual visit due November 2024.   Marylynn Pearson, RN 02/07/2023  2:06 PM

## 2023-02-08 ENCOUNTER — Other Ambulatory Visit: Payer: Self-pay

## 2023-02-08 ENCOUNTER — Other Ambulatory Visit: Payer: Self-pay | Admitting: Physical Medicine and Rehabilitation

## 2023-02-09 ENCOUNTER — Other Ambulatory Visit: Payer: Self-pay

## 2023-02-09 ENCOUNTER — Other Ambulatory Visit (HOSPITAL_COMMUNITY): Payer: Self-pay

## 2023-02-09 MED ORDER — TIZANIDINE HCL 4 MG PO TABS
4.0000 mg | ORAL_TABLET | Freq: Four times a day (QID) | ORAL | 0 refills | Status: DC | PRN
  Filled 2023-02-09: qty 30, 8d supply, fill #0

## 2023-02-12 ENCOUNTER — Telehealth: Payer: Self-pay | Admitting: General Practice

## 2023-02-12 ENCOUNTER — Other Ambulatory Visit: Payer: Self-pay

## 2023-02-12 ENCOUNTER — Other Ambulatory Visit: Payer: Self-pay | Admitting: Physical Medicine and Rehabilitation

## 2023-02-12 ENCOUNTER — Encounter: Payer: Self-pay | Admitting: Physical Medicine and Rehabilitation

## 2023-02-12 MED ORDER — TIZANIDINE HCL 4 MG PO TABS
4.0000 mg | ORAL_TABLET | Freq: Four times a day (QID) | ORAL | 3 refills | Status: DC | PRN
  Filled 2023-02-12: qty 90, 23d supply, fill #0
  Filled 2023-03-05: qty 90, 23d supply, fill #1

## 2023-02-12 NOTE — Telephone Encounter (Signed)
Patient called and left message on nurse voicemail line stating she needs a medication to stop this bleeding. Patient states she has been bleeding for 2 weeks which is causing cramping. Patient states her current medicine, megace, isn't working and she needs something else.  Please see nurse visit note from 4/24. Patient is scheduled for ultrasound next week.   Called patient, no answer- left message stating we are trying to reach her to return her phone call, please call us back.

## 2023-02-13 ENCOUNTER — Encounter (HOSPITAL_BASED_OUTPATIENT_CLINIC_OR_DEPARTMENT_OTHER): Payer: Medicare Other | Admitting: Physical Medicine and Rehabilitation

## 2023-02-13 ENCOUNTER — Other Ambulatory Visit: Payer: Self-pay

## 2023-02-13 DIAGNOSIS — G894 Chronic pain syndrome: Secondary | ICD-10-CM

## 2023-02-14 ENCOUNTER — Encounter: Payer: Self-pay | Admitting: Physical Medicine and Rehabilitation

## 2023-02-14 ENCOUNTER — Telehealth: Payer: Self-pay | Admitting: Physical Medicine and Rehabilitation

## 2023-02-14 ENCOUNTER — Other Ambulatory Visit: Payer: Self-pay

## 2023-02-14 ENCOUNTER — Other Ambulatory Visit (HOSPITAL_COMMUNITY): Payer: Self-pay

## 2023-02-14 MED ORDER — ACETAMINOPHEN-CODEINE 300-60 MG PO TABS
1.0000 | ORAL_TABLET | Freq: Three times a day (TID) | ORAL | 0 refills | Status: DC | PRN
  Filled 2023-02-14: qty 60, 20d supply, fill #0
  Filled ????-??-??: fill #0

## 2023-02-14 NOTE — Addendum Note (Signed)
Addended by: Horton Chin on: 02/14/2023 11:30 AM   Modules accepted: Orders

## 2023-02-14 NOTE — Progress Notes (Addendum)
Subjective:    Patient ID: Jill Shaw, female    DOB: [birth year], 47 y.o.   MRN: [id number]   HPI:     Jill Shaw is a 47 y.o. female who returns for f/u of inflammatory gastritis, chronic pain, diabetic peripheral neuropathy, and insomnia.   1) Insomnia: -She continues to experience insomnia at night. Asks about Ambien and I discussed that this is an addictive medication and there are other safer options we can try first that can also help with her pain.  -She is currently not taking either of these medications, or Amitriptyline or Trazodone.  -She takes Requip for resltless legs but this does not help. -She is still sleeping very poorly -She does use screens before bed time  2) Back pain secondary to lumbar radiculitis.  -she says she did not mean to request an early refill today, that she said this wrong, she was trying to explain to [place] -she has received 2 warnings for marijuana in her urine sample as well as for not bringing the number of correct pills to her appointments -she asks if tizanidine can be taken more than 4 times per day -She states her pain is located in her lower back radiating into her bilateral lower extremities and bilateral knee pain. She denies falling. She rates her pain 9. Her current exercise regime is walking.  -Ms. Stamos Morphine equivalent is 20.00 MME.  -She was hospitalized for serotonin syndrome after use of Savella and Cymbalta.  -Back pain has been severe -She requests a renewal of her handicap placard as she is unable to walk 200 feet without stopping to rest.  -Her pain has been better controlled with Norco -she would like to get another XR given the chronicity of her pain.  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 -she is concerned that 30 days will end Saturday and she is not able to pick up her oxycodone until Monday as per pharmacy  3) Diabetic  peripheral neuropathy -she would like to try tens unit  -she would like to repeat Qutenza  -she is not getting enough relief from tylenol #3 -she needs refill of her oxycodone -she is currently very limited in her mobility and needs her son's assistance 24/7 at home, she needs a not indicating this for her son to give at work. -she asks whether Qutenza can be administered more frequently than every 3 months, it has been helping a lot.  -she finds great benefits in her legs and found benefit in her hands as well but she kept forgetting and touching her face and this caused her face to birn -she is visiting her mother who has a pneumonia and asks whether her medications can be filled early -she asks whether her oxycodone can be released early as she is going on vacation next week.  -She tolerated Qutenza well and this provided 2 weeks of good relief, benefit started right away.  -Her peripheral neuropathy is worse in her bilateral lower extremities, especially the dorsum and soles of both feet, including the toes.  -now present in her hands as well- she has been dropping objects due to her neuropathy- present on the dorsal and ventral aspects of her hands -she is interested in following with a dietician -she is interested in medicines for her diabetes -she is trying to lose weight  -needs handicap placard  4) Right shoulder and  arm pain -notes limited range of motion in her shoulder -does have pain radiating into right arm from neck and arm feels heavy at times  5) Inflammatory gastritis -has been back and forth to the hospital as this has been really hurting her. She was given pain medication in the hospital. She was given medication for emesis.  -flexeril is making her sicker.  --patient says she has recently been diagnosed with this condition and pain has been severe. -she has her gastroenterologist recommended that she discuss with [place] increasing her Percocet to better control her  pain -she is currently prescribed 3 Percocet per day but feels she would benefit from up to 5 per day  6) GERD -she can have severe reflex at times -she was started on pepcid for a short time period to help with this.   7) Morbid obesity -BMI 47.03 -weight is 274 lbs -she lose 40 lbs when she was sick since she could not tolerate food but she has gained a lot of this back -she asks about supplement she can take for weight loss  8) Migraines: -she has been having headaches and asks if she can get a scan of her head.   9) Diabetic gastroparesis: -this has been severe and she was recommended by ED physician to decrease her opioid usage, but she is nervous to do so because she is in so much pain -she asks about trying tylenol with codeine  Pain Inventory Average Pain 9 Pain Right Now 9 My pain is sharp, burning, tingling and aching  In the last 24 hours, has pain interfered with the following? General activity 0 Relation with others 0 Enjoyment of life 5 What TIME of day is your pain at its worst? morning , daytime, evening and night Sleep (in general) Poor  Pain is worse with: walking, bending, sitting, inactivity, standing and some activites Pain improves with: heat/ice and medication Relief from Meds: 10  Family History  Problem Relation Age of Onset   Diabetes Mother    Hypertension Mother    Migraines Paternal Grandfather    Colon cancer Neg Hx    Esophageal cancer Neg Hx    Stomach cancer Neg Hx    Rectal cancer Neg Hx    Social History   Socioeconomic History   Marital status: Divorced    Spouse name: Not on file   Number of children: 3   Years of education: Not on file   Highest education level: Not on file  Occupational History   Occupation: unemployed  Tobacco Use   Smoking status: Some Days    Packs/day: 0.25    Years: 26.00    Additional pack years: 0.00    Total pack years: 6.50    Types: Cigarettes    Last attempt to quit: 09/13/2020    Years  since quitting: 2.4   Smokeless tobacco: Never   Tobacco comments:    4-5 cigarettes/day  Vaping Use   Vaping Use: Never used  Substance and Sexual Activity   Alcohol use: No   Drug use: No   Sexual activity: Not Currently    Birth control/protection: None  Other Topics Concern   Not on file  Social History Narrative   Right Handed   Lives in a one story apartment, but lives on the second floor   Drinks caffeine once in awhile   Social Determinants of Health   Financial Resource Strain: Not on file  Food Insecurity: Food Insecurity Present (12/08/2021)   Hunger  Vital Sign    Worried About Programme researcher, broadcasting/film/video in the Last Year: Never true    Ran Out of Food in the Last Year: Sometimes true  Transportation Needs: No Transportation Needs (12/08/2021)   PRAPARE - Administrator, Civil Service (Medical): No    Lack of Transportation (Non-Medical): No  Physical Activity: Not on file  Stress: Not on file  Social Connections: Not on file   Past Surgical History:  Procedure Laterality Date   CESAREAN SECTION     x 1. for twins   DILATION AND CURETTAGE OF UTERUS N/A 08/20/2019   Procedure: DILATATION AND CURETTAGE;  Surgeon: Allie Bossier, MD;  Location: [facility];  Service: Gynecology;  Laterality: N/A;   ENDOMETRIAL ABLATION N/A 08/20/2019   Procedure: Minerva Ablation;  Surgeon: Allie Bossier, MD;  Location: [facility] OR;  Service: Gynecology;  Laterality: N/A;   EYE SURGERY Bilateral    laser right and cataract removed left eye   LEFT HEART CATH AND CORONARY ANGIOGRAPHY N/A 08/30/2021   Procedure: LEFT HEART CATH AND CORONARY ANGIOGRAPHY;  Surgeon: Elder Negus, MD;  Location: [facility];  Service: Cardiovascular;  Laterality: N/A;   RADIOLOGY WITH ANESTHESIA N/A 09/16/2019   Procedure: MRI WITH ANESTHESIA   L SPINE WITHOUT CONTRAST, T SPINE WITHOUT CONTRAST , CERVICAL WITHOUT CONTRAST;  Surgeon: Radiologist, Medication, MD;  Location: [facility];  Service: Radiology;   Laterality: N/A;   TUBAL LIGATION     interval BTL   UPPER GI ENDOSCOPY  07/2017   Past Surgical History:  Procedure Laterality Date   CESAREAN SECTION     x 1. for twins   DILATION AND CURETTAGE OF UTERUS N/A 08/20/2019   Procedure: DILATATION AND CURETTAGE;  Surgeon: Allie Bossier, MD;  Location: [facility];  Service: Gynecology;  Laterality: N/A;   ENDOMETRIAL ABLATION N/A 08/20/2019   Procedure: Minerva Ablation;  Surgeon: Allie Bossier, MD;  Location: [facility];  Service: Gynecology;  Laterality: N/A;   EYE SURGERY Bilateral    laser right and cataract removed left eye   LEFT HEART CATH AND CORONARY ANGIOGRAPHY N/A 08/30/2021   Procedure: LEFT HEART CATH AND CORONARY ANGIOGRAPHY;  Surgeon: Elder Negus, MD;  [facility];  Service: Cardiovascular;  Laterality: N/A;   RADIOLOGY WITH ANESTHESIA N/A 09/16/2019   Procedure: MRI WITH ANESTHESIA   L SPINE WITHOUT CONTRAST, T SPINE WITHOUT CONTRAST , CERVICAL WITHOUT CONTRAST;  Surgeon: Radiologist, Medication, MD;  Location: [facility];  Service: Radiology;  Laterality: N/A;   TUBAL LIGATION     interval BTL   UPPER GI ENDOSCOPY  07/2017   Past Medical History:  Diagnosis Date   Anemia    Anxiety    Arthritis    knees, hands   Asthma    Chronic diastolic (congestive) heart failure (HCC)    COPD (chronic obstructive pulmonary disease) (HCC)    Diabetes mellitus (HCC)    DKA (diabetic ketoacidosis) (HCC)    Dysfunctional uterine bleeding    Gastritis    GERD (gastroesophageal reflux disease)    Hypertension    Intractable nausea and vomiting 03/23/2021   Neuromuscular disorder (HCC)    neuropathy feet   Seizures (HCC) 09/12/2017   pt states r/t stress and blood sugar - no meds last one 4 months ago, not seen neurologist   Sickle cell trait (HCC)    Smoker    Vitamin D deficiency 10/2019   Wears glasses  There were no vitals taken for this visit.  Opioid Risk Score:   Fall Risk Score:  `1  Depression screen [facility]  2/9     01/25/2023   10:52 AM 01/04/2023    3:22 PM 12/25/2022   10:18 AM 10/24/2022   10:03 AM 09/05/2022    2:45 PM 07/17/2022   11:04 AM 07/04/2022    8:58 AM  Depression screen PHQ 2/9  Decreased Interest 0 0 2 0 0 0 0  Down, Depressed, Hopeless 0 0 2 0 0 0 0  PHQ - 2 Score 0 0 4 0 0 0 0  Altered sleeping  3   2    Tired, decreased energy  3   2    Change in appetite     0    Feeling bad or failure about yourself      0    Trouble concentrating     0    Moving slowly or fidgety/restless     0    Suicidal thoughts     0    PHQ-9 Score  6   4       Review of Systems  Constitutional:  Positive for diaphoresis and unexpected weight change.  Respiratory:  Positive for shortness of breath and wheezing.   Gastrointestinal:  Positive for abdominal pain, constipation and nausea.  Musculoskeletal:  Positive for arthralgias, back pain, gait problem and myalgias.  Neurological:  Positive for dizziness, weakness and numbness.  Hematological:  Bruises/bleeds easily.  Psychiatric/Behavioral:  Positive for confusion and decreased concentration. The patient is nervous/anxious.   All other systems reviewed and are negative.      Objective:  Not performed     Assessment & Plan:  1. Lumbar Radiculitis: Continue current medication regimen. Continue HEP as Tolerated. Provided with handicap placard. XR ordered. Note for work provided via Clinical cytogeneticist.  2. Fibromyalgia: Continue HEP as Tolerated. Continue current Medication regimen. Continue to Monitor.  3. Bilateral Knee Pain: Right knee is currently worst, XR ordered and will call with results.  Continue to Monitor.  4. Chronic Pain Syndrome: Continue Percocet. Discussed that we cannot send early refills if medications are lost. Increase percocet to 6 tabs per day prn. Will send final warning letter to patient today to say that we do not permit early refills, discussed this with her pharmacy as well -provided note explaining her inability to work  due to her pain -discussed with patient when she last picked up oxycodone and when she is due to pick up next refill, called pharmacy to confirm  5. Insomnia: -Try to go outside near sunrise -Get exercise during the day.  -Discussed good sleep hygiene: turning off all devices an hour before bedtime.  -Chamomile tea with dinner.  -Melatonin did for help. -warm bath or shower before bed. -apply lavender oil for forehead at night  6. Diabetic peripheral neuropathy -provided note for son to give to work to indicate that patient needs his assistance 24/7 due to her mobility deficits -checked with Qutenza rep and medicaid/medicare will not cover Qutenza more frequently than every 3 months  -refilled tizanidine -Discussed Qutenza as an option for neuropathic pain control. Discussed that this is a capsaicin patch, stronger than capsaicin cream. Discussed that it is currently approved for diabetic peripheral neuropathy and post-herpetic neuralgia, but that it has also shown benefit in treating other forms of neuropathy. Provided patient with link to site to learn more about the patch: https://www.clark.biz/. Discussed that the patch would be  placed in office and benefits usually last 3 months. Discussed that unintended exposure to capsaicin can cause severe irritation of eyes, mucous membranes, respiratory tract, and skin, but that Qutenza is a local treatment and does not have the systemic side effects of other nerve medications. Discussed that there may be pain, itching, erythema, and decreased sensory function associated with the application of Qutenza. Side effects usually subside within 1 week. A cold pack of analgesic medications can help with these side effects. Blood pressure can also be increased due to pain associated with administration of the patch.   -discussed positive response to last Qutenza treatment  -switch from tylenol #3 to tylenol #4 TID PRN given lack of relief from  former  -discussed benefit from TENS unit in office  Prescribed Zynex Nexwave  7. Right sided shoulder and arm pain -discussed that could be a component or frozen shoulder and/or cervical radiculitis -will obtain right shoulder XR and cervical spine XR and call when results are available  8. Obesity: -Educated that current weight is 274 lbs and current BMI is 47.03 -shared foods that can assist in weight loss: 1) leafy greens- high in fiber and nutrients 2) dark chocolate- improves metabolism (if prefer sweetened, best to sweeten with honey instead of sugar).  3) cruciferous vegetables- high in fiber and protein 4) full fat yogurt: high in healthy fat, protein, calcium, and probiotics 5) apples- high in a variety of phytochemicals 6) nuts- high in fiber and protein that increase feelings of fullness 7) grapefruit: rich in nutrients, antioxidants, and fiber (not to be taken with anticoagulation) 8) beans- high in protein and fiber 9) salmon- has high quality protein and healthy fats 10) green tea- rich in polyphenols 11) eggs- rich in choline and vitamin D 12) tuna- high protein, boosts metabolism 13) avocado- decreases visceral abdominal fat 14) chicken (pasture raised): high in protein and iron 15) blueberries- reduce abdominal fat and cholesterol 16) whole grains- decreases calories retained during digestion, speeds metabolism 17) chia seeds- curb appetite 18) chilies- increases fat metabolism  -Discussed supplements that can be used:  1) Metatrim 400mg  BID 30 minutes before breakfast and dinner  2) Sphaeranthus indicus and Garcinia mangostana (combinations of these and #1 can be found in capsicum and zychrome  3) green coffee bean extract 400mg  twice per day or Irvingia (african mango) 150 to 300mg  twice per day.    9) Gastroesophageal reflux disease  -discussed the interaction between Tizanidine and Pepcid and that their additive effects could cause hypotension and  bradycardia. -recommended eating small meals -recommended avoiding acidic and spicy foods -recommended apple cider vinegar in a cup of water before meals.   10) Inflammatory gastritis: -recommended following up with GI  11) Migraines: -Head CT ordered  12) Diabetic gastroparesis: -wean Percocet to 5 tabs per day -add tylenol with codeine TID to replace this, assist with wean   5 minutes spent in discussion with patient about when she picked up her last oxycodone script, when would be 30 days after that, confirmed with pharmacy when medication could be released to patient   ADDENDUM: received message from April pharmacist on 5/1 that patient has been using tylenol #4 4 times per day which is in violation of contract, patient has already received warnings and we will now have to stop prescribing controlled medications for her, will help her to wean off her current regimen, can continue to provide tizanidine and Qutenza for pain relief in patient would like

## 2023-02-14 NOTE — Telephone Encounter (Signed)
Patient called back and left  the following message: "Never mind. I found them. No need to call back."

## 2023-02-14 NOTE — Telephone Encounter (Signed)
Called patient to schedule appointment with [place]. She did not want to schedule at this time. She said she will call to schedule next week.

## 2023-02-15 ENCOUNTER — Encounter: Payer: Self-pay | Admitting: Physical Medicine and Rehabilitation

## 2023-02-15 ENCOUNTER — Other Ambulatory Visit: Payer: Self-pay

## 2023-02-15 ENCOUNTER — Other Ambulatory Visit (HOSPITAL_BASED_OUTPATIENT_CLINIC_OR_DEPARTMENT_OTHER): Payer: Self-pay

## 2023-02-15 ENCOUNTER — Other Ambulatory Visit (HOSPITAL_COMMUNITY): Payer: Self-pay

## 2023-02-15 ENCOUNTER — Telehealth: Payer: Self-pay | Admitting: Pharmacist

## 2023-02-15 ENCOUNTER — Other Ambulatory Visit: Payer: Medicare Other | Admitting: Pharmacist

## 2023-02-15 ENCOUNTER — Telehealth: Payer: Self-pay | Admitting: *Deleted

## 2023-02-15 NOTE — Progress Notes (Signed)
Attempted to contact patient for scheduled appointment for medication management. Left HIPAA compliant message for patient to return my call at their convenience.   Will send MyChart.  Catie T. Khloie Hamada, PharmD, BCACP, CPP [facility] [phone]  

## 2023-02-15 NOTE — Telephone Encounter (Signed)
I spoke with April RPh at [facility] and instructed that she cancel the Rx for Tylenol #4 and Oxycodone Acetaminophen.  I spoke with Ms Ammirati as well. I told her that she will not be able to fill those medications and she must be seen today or tomorrow to get further narcotics. I informed her that at that point, the new Rx will be under a weaning process. We will no longer be prescribing narcotics for her after weaning process completed. She denied any problems and denied that she had the conversation with the pharmacist over the tylenol #4. At this point I reminded her that these are issues we have dealt with her before, and she had received 3 warning letters stating that any further discrepancies would result in discharge or non narcotic tx only.. Dr Carlis Abbott is making the decision to keep seeing her for Qutenza, but she will receive no further narcotics after her wean down process is complete.She states she cannot be seen today or tomorrow because of other appointments. She is requesting to be seen on Monday. She understands that if she runs out of her medication before then she will begin to feel sick. She reports she will be out on Saturday and understands but still says she will come in on Monday. Appointment is scheduled for 10 am 02/19/23.

## 2023-02-16 ENCOUNTER — Other Ambulatory Visit (HOSPITAL_COMMUNITY): Payer: Self-pay

## 2023-02-16 ENCOUNTER — Other Ambulatory Visit: Payer: Self-pay | Admitting: Nurse Practitioner

## 2023-02-16 ENCOUNTER — Telehealth: Payer: Self-pay | Admitting: Nurse Practitioner

## 2023-02-16 ENCOUNTER — Other Ambulatory Visit: Payer: Self-pay

## 2023-02-16 MED ORDER — DICYCLOMINE HCL 20 MG PO TABS
20.0000 mg | ORAL_TABLET | Freq: Two times a day (BID) | ORAL | 3 refills | Status: DC
  Filled 2023-02-16: qty 60, 30d supply, fill #0
  Filled 2023-03-14: qty 60, 30d supply, fill #1
  Filled 2023-04-16 (×2): qty 60, 30d supply, fill #2
  Filled 2023-05-09: qty 60, 30d supply, fill #3

## 2023-02-16 NOTE — Telephone Encounter (Signed)
--  PATIENT STATES EMERGENCY-- Caller & Relationship to patient:  PT MRN #  [id number]   Call Back Number:   Date of Last Office Visit: 02/16/2023     Date of Next Office Visit: 04/06/2023    Medication(s) to be Refilled: DICLOCLOMINE   Preferred Pharmacy: [facility]   ** Please notify patient to allow 48-72 hours to process** **Let patient know to contact pharmacy at the end of the day to make sure medication is ready. ** **If patient has not been seen in a year or longer, book an appointment **Advise to use MyChart for refill requests OR to contact their pharmacy

## 2023-02-18 ENCOUNTER — Emergency Department (HOSPITAL_COMMUNITY): Payer: Medicare Other

## 2023-02-18 ENCOUNTER — Emergency Department (HOSPITAL_COMMUNITY)
Admission: EM | Admit: 2023-02-18 | Discharge: 2023-02-18 | Disposition: A | Discharge status: Home / Self Care | Payer: Medicare Other | Attending: Emergency Medicine | Admitting: Emergency Medicine

## 2023-02-18 ENCOUNTER — Encounter (HOSPITAL_COMMUNITY): Payer: Self-pay | Admitting: *Deleted

## 2023-02-18 ENCOUNTER — Other Ambulatory Visit: Payer: Self-pay

## 2023-02-18 DIAGNOSIS — I6789 Other cerebrovascular disease: Secondary | ICD-10-CM | POA: Insufficient documentation

## 2023-02-18 DIAGNOSIS — K3184 Gastroparesis: Secondary | ICD-10-CM | POA: Diagnosis not present

## 2023-02-18 DIAGNOSIS — H0520 Unspecified exophthalmos: Secondary | ICD-10-CM | POA: Insufficient documentation

## 2023-02-18 DIAGNOSIS — S0990XA Unspecified injury of head, initial encounter: Secondary | ICD-10-CM | POA: Diagnosis not present

## 2023-02-18 DIAGNOSIS — R1084 Generalized abdominal pain: Secondary | ICD-10-CM | POA: Insufficient documentation

## 2023-02-18 DIAGNOSIS — R519 Headache, unspecified: Secondary | ICD-10-CM | POA: Insufficient documentation

## 2023-02-18 DIAGNOSIS — E1143 Type 2 diabetes mellitus with diabetic autonomic (poly)neuropathy: Secondary | ICD-10-CM | POA: Insufficient documentation

## 2023-02-18 DIAGNOSIS — G8929 Other chronic pain: Secondary | ICD-10-CM

## 2023-02-18 DIAGNOSIS — R112 Nausea with vomiting, unspecified: Secondary | ICD-10-CM | POA: Insufficient documentation

## 2023-02-18 DIAGNOSIS — R109 Unspecified abdominal pain: Secondary | ICD-10-CM | POA: Diagnosis not present

## 2023-02-18 LAB — COMPREHENSIVE METABOLIC PANEL
ALT: 12 U/L (ref 0–44)
AST: 19 U/L (ref 15–41)
Albumin: 4.2 g/dL (ref 3.5–5.0)
Alkaline Phosphatase: 82 U/L (ref 38–126)
Anion gap: 14 (ref 5–15)
BUN: 13 mg/dL (ref 6–20)
CO2: 18 mmol/L — ABNORMAL LOW (ref 22–32)
Calcium: 9.7 mg/dL (ref 8.9–10.3)
Chloride: 102 mmol/L (ref 98–111)
Creatinine, Ser: 1.58 mg/dL — ABNORMAL HIGH (ref 0.44–1.00)
GFR, Estimated: 41 mL/min — ABNORMAL LOW (ref >60)
Glucose, Bld: 538 mg/dL (ref 70–99)
Potassium: 4.2 mmol/L (ref 3.5–5.1)
Sodium: 134 mmol/L — ABNORMAL LOW (ref 135–145)
Total Bilirubin: 1.6 mg/dL — ABNORMAL HIGH (ref 0.3–1.2)
Total Protein: 7.4 g/dL (ref 6.5–8.1)

## 2023-02-18 LAB — URINALYSIS, ROUTINE W REFLEX MICROSCOPIC
Bacteria, UA: NONE SEEN
Bilirubin Urine: NEGATIVE
Glucose, UA: >=500 mg/dL — AB
Ketones, ur: 5 mg/dL — AB
Nitrite: NEGATIVE
Protein, ur: 30 mg/dL — AB
RBC / HPF: >50 RBC/hpf (ref 0–5)
Specific Gravity, Urine: 1.025 (ref 1.005–1.030)
pH: 6 (ref 5.0–8.0)

## 2023-02-18 LAB — CBC
HCT: 41.6 % (ref 36.0–46.0)
Hemoglobin: 14.1 g/dL (ref 12.0–15.0)
MCH: 27.1 pg (ref 26.0–34.0)
MCHC: 33.9 g/dL (ref 30.0–36.0)
MCV: 79.8 fL — ABNORMAL LOW (ref 80.0–100.0)
Platelets: 247 10*3/uL (ref 150–400)
RBC: 5.21 MIL/uL — ABNORMAL HIGH (ref 3.87–5.11)
RDW: 16.7 % — ABNORMAL HIGH (ref 11.5–15.5)
WBC: 14.2 10*3/uL — ABNORMAL HIGH (ref 4.0–10.5)
nRBC: 0 % (ref 0.0–0.2)

## 2023-02-18 LAB — I-STAT BETA HCG BLOOD, ED (MC, WL, AP ONLY)
I-stat hCG, quantitative: <5 m[IU]/mL (ref <5)
I-stat hCG, quantitative: <5 m[IU]/mL (ref <5)

## 2023-02-18 LAB — LIPASE, BLOOD: Lipase: 28 U/L (ref 11–51)

## 2023-02-18 LAB — CBG MONITORING, ED
Glucose-Capillary: 470 mg/dL — ABNORMAL HIGH (ref 70–99)
Glucose-Capillary: 405 mg/dL — ABNORMAL HIGH (ref 70–99)

## 2023-02-18 MED ORDER — HYDROMORPHONE HCL 1 MG/ML IJ SOLN
1.0000 mg | Freq: Once | INTRAMUSCULAR | Status: AC
  Administered 2023-02-18: 1 mg via INTRAVENOUS
  Filled 2023-02-18: qty 1

## 2023-02-18 MED ORDER — METOCLOPRAMIDE HCL 5 MG/ML IJ SOLN
10.0000 mg | Freq: Once | INTRAMUSCULAR | Status: AC
  Administered 2023-02-18: 10 mg via INTRAVENOUS
  Filled 2023-02-18: qty 2

## 2023-02-18 MED ORDER — INSULIN ASPART 100 UNIT/ML IJ SOLN
10.0000 [IU] | Freq: Once | INTRAMUSCULAR | Status: AC
  Administered 2023-02-18: 10 [IU] via SUBCUTANEOUS

## 2023-02-18 MED ORDER — LIDOCAINE 5 % EX PTCH
1.0000 | MEDICATED_PATCH | Freq: Once | CUTANEOUS | Status: AC
  Administered 2023-02-18: 1 via TRANSDERMAL
  Filled 2023-02-18: qty 1

## 2023-02-18 MED ORDER — LACTATED RINGERS IV BOLUS
1000.0000 mL | Freq: Once | INTRAVENOUS | Status: AC
  Administered 2023-02-18: 1000 mL via INTRAVENOUS

## 2023-02-18 MED ORDER — DICYCLOMINE HCL 10 MG/ML IM SOLN
20.0000 mg | Freq: Once | INTRAMUSCULAR | Status: AC
  Administered 2023-02-18: 20 mg via INTRAMUSCULAR
  Filled 2023-02-18: qty 2

## 2023-02-18 MED ORDER — DROPERIDOL 2.5 MG/ML IJ SOLN
1.2500 mg | Freq: Once | INTRAMUSCULAR | Status: AC
  Administered 2023-02-18: 1.25 mg via INTRAVENOUS
  Filled 2023-02-18: qty 2

## 2023-02-18 MED ORDER — SODIUM CHLORIDE 0.9 % IV BOLUS
1000.0000 mL | Freq: Once | INTRAVENOUS | Status: AC
  Administered 2023-02-18: 1000 mL via INTRAVENOUS

## 2023-02-18 MED ORDER — IOHEXOL 350 MG/ML SOLN
75.0000 mL | Freq: Once | INTRAVENOUS | Status: AC | PRN
  Administered 2023-02-18: 75 mL via INTRAVENOUS

## 2023-02-18 NOTE — ED Provider Notes (Signed)
Patient's care assumed by me at 6:30 AM from previous provider.  Patient has a history of diabetic gastroparesis.  Patient came to the emergency department having severe pain as well as reporting that she fell 2 days ago and hit her head and has an ongoing headache.  Patient is on chronic pain medicine and is followed by pain management.  Patient reports to me that she has not been eating or drinking and has been vomiting.  Patient has not been taking her insulin because she was worried that her glucose would drop.  Patient reports she had a significant hypoglycemic episode last week. Physical exam vitals are stable, patient gagging no vomiting. T scan abdomen shows no acute abdomen malady CT head shows no acute abnormality. Was given IV fluids x 1 L and 10 units of insulin subcu.  I rechecked patient's glucose and it is now 479.  Pt  is given a second liter of IV fluids and an additional 10 units of insulin.  Patient is requesting pain medicine.  I advised patient I will give her dosage of pain medicine here however she needs to call her pain management doctor tomorrow for ongoing pain management.  I am suspicious that some of her symptoms today are persistent due to her chronic withdrawal as well as her chronic gastroparesis. And is improved after pain medicine and wants to go home.  Glucose is trending downward.  I discussed with her the importance of checking her glucose regularly and taking her medications.   Elson Areas, [place] 02/18/23 1152    Margarita Grizzle, MD 02/20/23 [phone]

## 2023-02-18 NOTE — ED Provider Notes (Signed)
[place] EMERGENCY DEPARTMENT AT [facility] Provider Note   CSN: [id number] Arrival date & time: 02/18/23  4098     History  Chief Complaint  Patient presents with   Abdominal Pain   Generalized Body Aches    Jill Shaw is a 47 y.o. female.  47 year old female with past medical history of chronic low back pain, gastroparesis presents today for evaluation of concern for gastroparesis flareup.  She states last time this happened was in March.  States this started 2 to 3 days ago.  Endorses associated nausea, vomiting, but denies dysuria, flank pain, hematemesis, blood in stool.  The history is provided by the patient. No language interpreter was used.       Home Medications Prior to Admission medications   Medication Sig Start Date End Date Taking? Authorizing Provider  Accu-Chek Softclix Lancets lancets USE TO TEST AS DIRECTED UP TO FOUR TIMES DAILY. 03/06/22   Shamleffer, Konrad Dolores, MD  albuterol (PROVENTIL) (2.5 MG/3ML) 0.083% nebulizer solution Inhale into the lungs. 09/27/20   [provider]  albuterol (VENTOLIN HFA) 108 (90 Base) MCG/ACT inhaler Inhale 2 puffs into the lungs every 6 (six) hours as needed for wheezing or shortness of breath. 07/18/21   Luciano Cutter, MD  B-D ULTRAFINE III SHORT PEN 31G X 8 MM MISC USE AS DIRECTED EVERY MORNING, NOON, EVENING, AND AT BEDTIME 07/10/22   Shamleffer, Konrad Dolores, MD  Continuous Blood Gluc Sensor (FREESTYLE LIBRE 3 SENSOR) MISC Place 1 sensor on the skin every 14 days. Use to check glucose continuously 07/06/22   Ivonne Andrew, NP  Dexlansoprazole 30 MG capsule DR TAKE 1 CAPSULE (30 MG TOTAL) BY MOUTH DAILY. NEEDS OFFICE VISIT FOR ADDITIONAL REFILLS 01/29/23   Ivonne Andrew, NP  dicyclomine (BENTYL) 20 MG tablet Take 1 tablet (20 mg total) by mouth 2 (two) times daily. 02/16/23   Ivonne Andrew, NP  furosemide (LASIX) 40 MG tablet TAKE 1 TABLET(40 MG) BY MOUTH TWICE DAILY 01/19/23   Patwardhan,  Manish J, MD  glucose blood (ACCU-CHEK GUIDE) test strip USE TO TEST BLOOD GLUCOSE AS DIRECTED UP TO 4 TIMES DAILY 11/14/22   Ivonne Andrew, NP  HUMALOG KWIKPEN 200 UNIT/ML KwikPen Inject 50 Units into the skin 3 (three) times daily before meals. 12/27/22   Ivonne Andrew, NP  hydrochlorothiazide (MICROZIDE) 12.5 MG capsule TAKE 1 CAPSULE(12.5 MG) BY MOUTH DAILY Patient taking differently: Take 12.5 mg by mouth daily. 12/14/21   Cantwell, Celeste C, PA-C  losartan (COZAAR) 100 MG tablet Take 1 tablet (100 mg total) by mouth daily. 01/11/23   Ivonne Andrew, NP  megestrol (MEGACE) 40 MG tablet Take two tablets three times daily for 3 days, then two tablets twice daily 10/23/22   Lorriane Shire, MD  metoCLOPramide (REGLAN) 10 MG tablet Take 1 tablet (10 mg total) by mouth every 6 (six) hours as needed for nausea. 01/17/23   Al Decant, PA-C  metoprolol succinate (TOPROL-XL) 100 MG 24 hr tablet Take 100 mg by mouth daily. Take with or immediately following a meal.    [provider]  potassium chloride SA (KLOR-CON M) 20 MEQ tablet Take 1 tablet (20 mEq total) by mouth daily. 01/08/23   Ivonne Andrew, NP  rosuvastatin (CRESTOR) 20 MG tablet TAKE 1 TABLET(20 MG) BY MOUTH DAILY 01/19/23   Patwardhan, Anabel Bene, MD  SPIRIVA RESPIMAT 2.5 MCG/ACT AERS INHALE 2 PUFFS INTO THE LUNGS DAILY 02/03/22   Audie Box  L, DO  spironolactone (ALDACTONE) 50 MG tablet TAKE 1 TABLET(50 MG) BY MOUTH DAILY 12/22/20   Patwardhan, Anabel Bene, MD  SYMBICORT 160-4.5 MCG/ACT inhaler INHALE 2 PUFFS INTO THE LUNGS TWICE DAILY 10/31/22   Icard, Rachel Bo, DO  tiZANidine (ZANAFLEX) 4 MG tablet Take 1 tablet (4 mg total) by mouth every 6 (six) hours as needed for muscle spasms. 02/12/23   Raulkar, Drema Pry, MD      Allergies    Elavil [amitriptyline], Orudis [ketoprofen], Desyrel [trazodone], Aspirin, Motrin [ibuprofen], Naprosyn [naproxen], Neurontin [gabapentin], Sulfa antibiotics, Ultram [tramadol], Victoza  [liraglutide], and Zegerid [omeprazole-sodium bicarbonate]    Review of Systems   Review of Systems  Constitutional:  Negative for chills and fever.  Gastrointestinal:  Positive for abdominal pain, nausea and vomiting.  Genitourinary:  Negative for dysuria and flank pain.  Neurological:  Negative for light-headedness.  All other systems reviewed and are negative.   Physical Exam Updated Vital Signs BP (!) 175/135 (BP Location: Right Arm)   Pulse (!) 126   Temp 98.1 F (36.7 C) (Oral)   Resp (!) 24   Ht 5\' 4"  (1.626 m)   Wt 113.4 kg   SpO2 98%   BMI 42.91 kg/m  Physical Exam Vitals and nursing note reviewed.  Constitutional:      General: She is not in acute distress.    Appearance: Normal appearance. She is obese. She is not ill-appearing.  HENT:     Head: Normocephalic and atraumatic.     Nose: Nose normal.  Eyes:     General: No scleral icterus.    Extraocular Movements: Extraocular movements intact.     Conjunctiva/sclera: Conjunctivae normal.  Cardiovascular:     Rate and Rhythm: Normal rate and regular rhythm.     Pulses: Normal pulses.     Heart sounds: Normal heart sounds.  Pulmonary:     Effort: Pulmonary effort is normal. No respiratory distress.     Breath sounds: Normal breath sounds. No wheezing or rales.  Abdominal:     General: There is no distension.     Tenderness: There is no abdominal tenderness.  Musculoskeletal:        General: Normal range of motion.     Cervical back: Normal range of motion.  Skin:    General: Skin is warm and dry.  Neurological:     General: No focal deficit present.     Mental Status: She is alert. Mental status is at baseline.    ED Results / Procedures / Treatments   Labs (all labs ordered are listed, but only abnormal results are displayed) Labs Reviewed  LIPASE, BLOOD  COMPREHENSIVE METABOLIC PANEL  CBC  URINALYSIS, ROUTINE W REFLEX MICROSCOPIC  I-STAT BETA HCG BLOOD, ED ([facility], [facility], [facility] ONLY)     EKG None  Radiology No results found.  Procedures Procedures    Medications Ordered in ED Medications  metoCLOPramide (REGLAN) injection 10 mg (has no administration in time range)  dicyclomine (BENTYL) injection 20 mg (has no administration in time range)  lidocaine (LIDODERM) 5 % 1 patch (has no administration in time range)  lactated ringers bolus 1,000 mL (has no administration in time range)    ED Course/ Medical Decision Making/ A&P                             Medical Decision Making Amount and/or Complexity of Data Reviewed Labs: ordered. Radiology: ordered.  Risk Prescription drug  management.   47 year old female with past medical history of chronic low back pain, gastroparesis presents today for evaluation of abdominal pain, nausea, and vomiting ongoing for the past 2 to 3 days.  She did run out of her pain medication.  She is requesting pain medication to treat her gastroparesis.  We discussed that we will trial nonopioid options and avoid narcotic pain medications as that will not help this problem.  CBC shows mild leukocytosis which is likely reactive.  No anemia.  CMP shows glucose of 538, creatinine 1.58.  She is on insulin at home.  Will give 10 units here.  Otherwise electrolytes are within normal.  Lipase within normal limits.  UA shows some signs of UTI with moderate leukocytes, 21-50 WBCs.  Negative for nitrite.  Will send for urine culture.  Patient on reevaluation reporting persistent pain.  Will give dose of droperidol and reevaluate.  Patient at the end of my shift is awaiting CT abdomen pelvis and CT head.  She will also need to be reevaluated after this.  Patient signed out to oncoming provider.   Final Clinical Impression(s) / ED Diagnoses Final diagnoses:  Generalized abdominal pain    Rx / DC Orders ED Discharge Orders     None         Marita Kansas, PA-C 02/18/23 0645    Mesner, Barbara Cower, MD 02/18/23 [phone]

## 2023-02-18 NOTE — ED Triage Notes (Signed)
Patient c/o pain all over , c/o abd. Pain states she is in a pain clinic and her last pill was Sat will not be able to get refill until Monday.

## 2023-02-18 NOTE — Discharge Instructions (Signed)
Call your Physician tomorrow to discuss pain mangement

## 2023-02-18 NOTE — ED Notes (Signed)
BP cuff, pulse Ox, and cardiac monitor/electrodes replaced on patient. Reeducated patient on the importance and need for monitoring VS. The patient continues to remove monitoring equipment.

## 2023-02-18 NOTE — ED Notes (Signed)
Cardiac monitor, pulse ox, and BP cuff reapplied to patient. Educated patient the need and importance of monitoring her VS.

## 2023-02-18 NOTE — ED Notes (Signed)
Critical Glucose 538 reported to A. Amjad, PA

## 2023-02-18 NOTE — ED Notes (Signed)
Pt removed her monitor leads & BP cuff & was ready to leave & v/s unable to be updated within the hr of d/c d/t her not wanting it placed back on.

## 2023-02-19 ENCOUNTER — Encounter: Payer: Self-pay | Admitting: Registered Nurse

## 2023-02-19 ENCOUNTER — Telehealth: Payer: Self-pay

## 2023-02-19 ENCOUNTER — Encounter: Payer: Medicare Other | Attending: Physical Medicine and Rehabilitation | Admitting: Registered Nurse

## 2023-02-19 ENCOUNTER — Other Ambulatory Visit: Payer: Self-pay

## 2023-02-19 VITALS — BP 182/106 | HR 100 | Ht 64.0 in | Wt 267.0 lb

## 2023-02-19 DIAGNOSIS — I10 Essential (primary) hypertension: Secondary | ICD-10-CM | POA: Diagnosis not present

## 2023-02-19 DIAGNOSIS — M62838 Other muscle spasm: Secondary | ICD-10-CM

## 2023-02-19 DIAGNOSIS — Z79891 Long term (current) use of opiate analgesic: Secondary | ICD-10-CM

## 2023-02-19 DIAGNOSIS — M5416 Radiculopathy, lumbar region: Secondary | ICD-10-CM | POA: Diagnosis not present

## 2023-02-19 DIAGNOSIS — M25512 Pain in left shoulder: Secondary | ICD-10-CM

## 2023-02-19 DIAGNOSIS — G894 Chronic pain syndrome: Secondary | ICD-10-CM

## 2023-02-19 DIAGNOSIS — M170 Bilateral primary osteoarthritis of knee: Secondary | ICD-10-CM

## 2023-02-19 DIAGNOSIS — G629 Polyneuropathy, unspecified: Secondary | ICD-10-CM | POA: Diagnosis not present

## 2023-02-19 DIAGNOSIS — M25511 Pain in right shoulder: Secondary | ICD-10-CM | POA: Insufficient documentation

## 2023-02-19 DIAGNOSIS — Z5181 Encounter for therapeutic drug level monitoring: Secondary | ICD-10-CM

## 2023-02-19 LAB — URINE CULTURE

## 2023-02-19 MED ORDER — OXYCODONE-ACETAMINOPHEN 10-325 MG PO TABS
1.0000 | ORAL_TABLET | Freq: Every day | ORAL | 0 refills | Status: DC | PRN
  Filled 2023-02-19: qty 150, 30d supply, fill #0

## 2023-02-19 MED ORDER — OXYCODONE-ACETAMINOPHEN 10-325 MG PO TABS: 1.0000 | ORAL_TABLET | Freq: Every day | ORAL | 0 refills | Status: DC | PRN

## 2023-02-19 NOTE — Progress Notes (Signed)
   Care Guide Note  02/19/2023 Name: MIKAHLA CORLESS MRN: [id number] DOB: [birth year]  Referred by: Ivonne Andrew, NP Reason for referral : Care Coordination (Outreach to reschedule with Pharm d )   LIVANA OBERLY is a 47 y.o. year old female who is a primary care patient of Ivonne Andrew, NP. RENAYE MELLIES was referred to the pharmacist for assistance related to DM.    An unsuccessful telephone outreach was attempted today to contact the patient who was referred to the pharmacy team for assistance with medication management. Additional attempts will be made to contact the patient.   Penne Lash, RMA Care Guide [facility] [place]  [place], [place] [address] Direct Dial: [phone] [email or web address]

## 2023-02-19 NOTE — Progress Notes (Signed)
Subjective:    Patient ID: Jill Shaw, female    DOB: 06/01/1976, 47 y.o.   MRN: [id number]  HPI: Jill Shaw is a 47 y.o. female who returns for follow up appointment for chronic pain and medication refill. She states her pain is located in her bilateral shoulders, lower back pain radiating into her bilateral lower extremities and bilateral knee pain. She rates her pain 10. Her current exercise regime is walking and performing stretching exercises.  Jill Shaw is aware today we are beginning her slow weaning of Oxycodone, see note for details. She verbalizes understanding.   Arrived to office with uncontrolled hypertension, she reports she is compliant with her anti-hypertensive medication. Blood Pressure was rechecked, she refuses ED evaluation. Jill Shaw states she will follow up with her PCP.   Jill Shaw Morphine equivalent is 75.00 MME.   Last UDS was Performed on 12/25/2022, it was consistent.      Pain Inventory Average Pain 10 Pain Right Now 10 My pain is constant, sharp, burning, stabbing, tingling, and aching  In the last 24 hours, has pain interfered with the following? General activity 1 Relation with others 1 Enjoyment of life 1 What TIME of day is your pain at its worst? morning , daytime, evening, and night Sleep (in general) Poor  Pain is worse with: walking, bending, sitting, inactivity, standing, unsure, and some activites Pain improves with: heat/ice and medication Relief from Meds:  not taking pain medication  Family History  Problem Relation Age of Onset   Diabetes Mother    Hypertension Mother    Migraines Paternal Grandfather    Colon cancer Neg Hx    Esophageal cancer Neg Hx    Stomach cancer Neg Hx    Rectal cancer Neg Hx    Social History   Socioeconomic History   Marital status: Divorced    Spouse name: Not on file   Number of children: 3   Years of education: Not on file   Highest education level: Not on file  Occupational  History   Occupation: unemployed  Tobacco Use   Smoking status: Some Days    Packs/day: 0.25    Years: 26.00    Additional pack years: 0.00    Total pack years: 6.50    Types: Cigarettes    Last attempt to quit: 09/13/2020    Years since quitting: 2.4   Smokeless tobacco: Never   Tobacco comments:    4-5 cigarettes/day  Vaping Use   Vaping Use: Never used  Substance and Sexual Activity   Alcohol use: No   Drug use: No   Sexual activity: Not Currently    Birth control/protection: None  Other Topics Concern   Not on file  Social History Narrative   Right Handed   Lives in a one story apartment, but lives on the second floor   Drinks caffeine once in awhile   Social Determinants of Health   Financial Resource Strain: Not on file  Food Insecurity: Food Insecurity Present (12/08/2021)   Hunger Vital Sign    Worried About Running Out of Food in the Last Year: Never true    Ran Out of Food in the Last Year: Sometimes true  Transportation Needs: No Transportation Needs (12/08/2021)   PRAPARE - Administrator, Civil Service (Medical): No    Lack of Transportation (Non-Medical): No  Physical Activity: Not on file  Stress: Not on file  Social Connections: Not on file   Past  Surgical History:  Procedure Laterality Date   CESAREAN SECTION     x 1. for twins   DILATION AND CURETTAGE OF UTERUS N/A 08/20/2019   Procedure: DILATATION AND CURETTAGE;  Surgeon: Allie Bossier, MD;  Location: [facility] OR;  Service: Gynecology;  Laterality: N/A;   ENDOMETRIAL ABLATION N/A 08/20/2019   Procedure: Minerva Ablation;  Surgeon: Allie Bossier, MD;  Location: [facility] OR;  Service: Gynecology;  Laterality: N/A;   EYE SURGERY Bilateral    laser right and cataract removed left eye   LEFT HEART CATH AND CORONARY ANGIOGRAPHY N/A 08/30/2021   Procedure: LEFT HEART CATH AND CORONARY ANGIOGRAPHY;  Surgeon: Elder Negus, MD;  Location: [facility] INVASIVE CV LAB;  Service: Cardiovascular;  Laterality: N/A;    RADIOLOGY WITH ANESTHESIA N/A 09/16/2019   Procedure: MRI WITH ANESTHESIA   L SPINE WITHOUT CONTRAST, T SPINE WITHOUT CONTRAST , CERVICAL WITHOUT CONTRAST;  Surgeon: Radiologist, Medication, MD;  Location: [facility] OR;  Service: Radiology;  Laterality: N/A;   TUBAL LIGATION     interval BTL   UPPER GI ENDOSCOPY  07/2017   Past Surgical History:  Procedure Laterality Date   CESAREAN SECTION     x 1. for twins   DILATION AND CURETTAGE OF UTERUS N/A 08/20/2019   Procedure: DILATATION AND CURETTAGE;  Surgeon: Allie Bossier, MD;  Location: [facility] OR;  Service: Gynecology;  Laterality: N/A;   ENDOMETRIAL ABLATION N/A 08/20/2019   Procedure: Minerva Ablation;  Surgeon: Allie Bossier, MD;  Location: [facility] OR;  Service: Gynecology;  Laterality: N/A;   EYE SURGERY Bilateral    laser right and cataract removed left eye   LEFT HEART CATH AND CORONARY ANGIOGRAPHY N/A 08/30/2021   Procedure: LEFT HEART CATH AND CORONARY ANGIOGRAPHY;  Surgeon: Elder Negus, MD;  [facility]: [facility] IN[facility];  Service: Cardiovascular;  Laterality: N/A;   RADIOLOGY WITH ANESTHESIA N/A 09/16/2019   Procedure: MRI WITH ANESTHESIA   L SPINE WITHOUT CONTRAST, T SPINE WITHOUT CONTRAST , CERVICAL WITHOUT CONTRAST;  Surgeon: Radiologist, Medication, MD;  Location: [facility] OR;  Service: Radiology;  Laterality: N/A;   TUBAL LIGATION     interval BTL   UPPER GI ENDOSCOPY  07/2017   Past Medical History:  Diagnosis Date   Anemia    Anxiety    Arthritis    knees, hands   Asthma    Chronic diastolic (congestive) heart failure (HCC)    COPD (chronic obstructive pulmonary disease) (HCC)    Diabetes mellitus (HCC)    DKA (diabetic ketoacidosis) (HCC)    Dysfunctional uterine bleeding    Gastritis    GERD (gastroesophageal reflux disease)    Hypertension    Intractable nausea and vomiting 03/23/2021   Neuromuscular disorder (HCC)    neuropathy feet   Seizures (HCC) 09/12/2017   pt states r/t stress and blood sugar - no meds last one 4  months ago, not seen neurologist   Sickle cell trait (HCC)    Smoker    Vitamin D deficiency 10/2019   Wears glasses    BP (!) 182/106   Pulse 100   Ht 5\' 4"  (1.626 m)   Wt 267 lb (121.1 kg)   SpO2 98%   BMI 45.83 kg/m   Opioid Risk Score:   Fall Risk Score:  `1  Depression screen PHQ 2/9     02/19/2023   10:00 AM 01/25/2023   10:52 AM 01/04/2023    3:22 PM 12/25/2022   10:18 AM 10/24/2022  10:03 AM 09/05/2022    2:45 PM 07/17/2022   11:04 AM  Depression screen PHQ 2/9  Decreased Interest 0 0 0 2 0 0 0  Down, Depressed, Hopeless 0 0 0 2 0 0 0  PHQ - 2 Score 0 0 0 4 0 0 0  Altered sleeping   3   2   Tired, decreased energy   3   2   Change in appetite      0   Feeling bad or failure about yourself       0   Trouble concentrating      0   Moving slowly or fidgety/restless      0   Suicidal thoughts      0   PHQ-9 Score   6   4     Review of Systems  Gastrointestinal:  Positive for abdominal pain.  Musculoskeletal:  Positive for back pain.       Right shoulder pain, pain in both feet, both hands, both knee  All other systems reviewed and are negative.      Objective:   Physical Exam Vitals and nursing note reviewed.  Constitutional:      Appearance: Normal appearance.  Cardiovascular:     Rate and Rhythm: Normal rate and regular rhythm.     Pulses: Normal pulses.     Heart sounds: Normal heart sounds.  Pulmonary:     Effort: Pulmonary effort is normal.     Breath sounds: Normal breath sounds.  Musculoskeletal:     Cervical back: Normal range of motion and neck supple.     Comments: Normal Muscle Bulk and Muscle Testing Reveals:  Upper Extremities: Full ROM and Muscle Strength 5/5 Bilateral  AC Joint Tenderness  Lumbar Hypersensitivity  Lower Extremities: Full ROM and Muscle Strength 5/5 Arises from Table with ease Narrow Based  Gait     Skin:    General: Skin is warm and dry.  Neurological:     Mental Status: She is alert and oriented to person, place,  and time.  Psychiatric:        Mood and Affect: Mood normal.        Behavior: Behavior normal.           Assessment & Plan:  1. Lumbar Radiculitis: Continue current medication regimen. Continue HEP as Tolerated.02/19/2023 2. Fibromyalgia/Polyneuropathy: Continue HEP as Tolerated. Continue current Medication regimen. Continue to Monitor. 02/19/2023 3. Bilateral Knee Pain: R>L. Continue HEP as tolerated. Continue to Monitor.05/06//2024 4. Chronic Pain Syndrome:Begin slow weaning: Refilled:  Oxycodone 10/325mg  one tablet 5 times  a day as needed for pain #150.  We will continue the opioid monitoring program, this consists of regular clinic visits, examinations, urine drug screen, pill counts as well as use of [place] Controlled Substance Reporting system. A 12 month History has been reviewed on the [place] Controlled Substance Reporting System on 05/062024.  5.  Muscle Spasm: Continue Tizanidine: Continue to Monitor . 12/25/2022  6. Uncontrolled Hypertension: Blood Pressure was re-checked. She refuses ED evaluation. Jill Shaw states she will call her PCP, she was encouraged to F/U with her PCP she verbalizes understanding.   F/U in 1 month

## 2023-02-20 ENCOUNTER — Encounter: Payer: Self-pay | Admitting: Physical Medicine and Rehabilitation

## 2023-02-20 ENCOUNTER — Other Ambulatory Visit: Payer: Self-pay

## 2023-02-20 NOTE — Telephone Encounter (Signed)
Lvm to check on pt . KH

## 2023-02-21 ENCOUNTER — Telehealth: Payer: Self-pay | Admitting: Registered Nurse

## 2023-02-21 ENCOUNTER — Ambulatory Visit (HOSPITAL_COMMUNITY): Payer: Medicare Other

## 2023-02-21 NOTE — Telephone Encounter (Signed)
Return Ms. Agent call.  We had discussed her weaned down at her appointment and reiterated it today. She verbalizes understanding.

## 2023-02-22 ENCOUNTER — Other Ambulatory Visit: Payer: Self-pay | Admitting: Nurse Practitioner

## 2023-02-22 ENCOUNTER — Telehealth: Payer: Self-pay

## 2023-02-22 DIAGNOSIS — G8929 Other chronic pain: Secondary | ICD-10-CM

## 2023-02-26 ENCOUNTER — Telehealth: Payer: Self-pay | Admitting: General Practice

## 2023-02-26 ENCOUNTER — Other Ambulatory Visit: Payer: Self-pay

## 2023-02-26 NOTE — Telephone Encounter (Signed)
Called pt and left a message with this information advise pt to call with any questions or concerns. Gh

## 2023-02-26 NOTE — Telephone Encounter (Signed)
Patient called and left message on nurse voicemail line stating she wants the doctor to send over a prescription for norethindrone and tylenol #4. She states she has been bleeding and cramping for weeks. Per chart review, patient no showed for ultrasound appt 5/8. Per Dr Briscoe Deutscher, patient needs to have ultrasound then office visit for EMB before additional meds can be given- concern for cancer due to prolonged bleeding without response despite multiple meds. Patient hasn't had ultrasound since 2021.

## 2023-02-27 ENCOUNTER — Telehealth: Payer: Self-pay

## 2023-02-27 NOTE — Telephone Encounter (Signed)
Patient called nurse line today at 1553 stating that she was returning a phone call from [place] and would like to speak to a nurse.   Maureen Ralphs RN on 02/27/23 at [phone]

## 2023-02-27 NOTE — Telephone Encounter (Signed)
Per chart review Dr. Briscoe Deutscher saw patient 10/23/22, ordered US and new RX for megace since continued bleeding on Norethindrone and depo-provera . Patient [facility] [place] 10/25/22 and 02/21/23. I called patient to see if she tried the megace and for how long but reached voicemail. I left message I am calling to discuss her complaints.  I rescheduled [place] for 03/08/23 at 230 at [facility] -arrive Entrance A at 2:15 with full bladder. I left [place] appointment on Blimi's voicemail .  Nancy Fetter

## 2023-02-28 ENCOUNTER — Telehealth: Payer: Self-pay | Admitting: Nurse Practitioner

## 2023-02-28 ENCOUNTER — Telehealth: Payer: Self-pay

## 2023-02-28 NOTE — Telephone Encounter (Signed)
Called Pt to find out if she is still taking Megace & if she received her new [place] appt. Date of 03/08/23. Pt advised that the Megace is not stopping the bleeding at all still heavy with clots & is in a lot of pain. Advised Pt will forward info to Dr Briscoe Deutscher to address & to make sure to keep [place] appointment./ Pt verbalized understanding.

## 2023-02-28 NOTE — Telephone Encounter (Signed)
Called patient to schedule Medicare Annual Wellness Visit (AWV). Left message for patient to call back and schedule Medicare Annual Wellness Visit (AWV).  Last date of AWV: due 11/16/2022 awvi per [facility]  Please schedule an appointment at any time with Abby, NHA. .  If any questions, please contact me at [phone].  Thank you,  Stephanie,  AMB Clinical Support [facility] AWV Program Direct Dial ??[phone]    

## 2023-03-01 ENCOUNTER — Other Ambulatory Visit: Payer: Self-pay

## 2023-03-01 NOTE — Telephone Encounter (Signed)
Returned patients call. She reports she spoke with a nurse yesterday. She reports the pain she is experiencing is unbearable. She reports she feels like she is pushing out a baby. She denies vaginal discharge, pain with voiding, or anything hanging out of the vagina.   She is taking Oxycodone and cannot exceed any more. She reports Vicoden did not work. She reports Dilaudid does not help. She reports her previous doctor has been weaning her off her pain medication and they will not write it anymore. She say " The doctor has to write me pain medication". She reports she is in pain all the time and is difficult to sit and walk. She reports sometimes she feels like she would fall without her walker.   Reviewed with patient her upcoming ultrasound appointment, she was aware of date and time of appointment. She reports she did not make the previous US appointments as she was in pain. Reviewed with patient that for Dr. Briscoe Deutscher to help her she needs to keep her Ultrasound appointment. Reviewed that we need to rule out abnormalities in the uterus, including conditions such as fibroids and cancer. Patient voiced understanding.   Reviewed I would forward message to Dr. Briscoe Deutscher, however am not sure she can write the pain medication at this time. Will reach back out to patient once we hear back from Dr. Briscoe Deutscher.

## 2023-03-02 NOTE — Telephone Encounter (Signed)
I called Jill Shaw back and informed her Dr. Briscoe Deutscher is not going to  write any prescription for nartcotics and that it is very important she keep [place] appointment on 03/08/23 so the doctor knows what she is treating. I explained if her pain is that severe she will need to go to the ER for evaluation. She voices understanding. Nancy Fetter

## 2023-03-05 ENCOUNTER — Telehealth: Payer: Self-pay

## 2023-03-05 ENCOUNTER — Other Ambulatory Visit: Payer: Self-pay

## 2023-03-05 NOTE — Telephone Encounter (Signed)
Patient called nurse line c/o prolonged bleeding; stated that the bleeding is "too much" and constant and painful.   Patient is scheduled for u/s appointment on Thursday 5/23--per provider (Dr. Briscoe Deutscher) who is following and treating this concern, the patient must complete US appointment so that she can have a better idea of what exactly she is teaching. Dr. Briscoe Deutscher has stated that she will not prescribe narcotics for this issue at this time (per telephone encounter note from 02/27/23).   Maureen Ralphs RN on 03/05/23 at [phone]

## 2023-03-08 ENCOUNTER — Ambulatory Visit (HOSPITAL_COMMUNITY)
Admission: RE | Admit: 2023-03-08 | Discharge: 2023-03-08 | Disposition: A | Discharge status: Home / Self Care | Payer: Medicare Other | Source: Ambulatory Visit | Attending: Obstetrics and Gynecology | Admitting: Obstetrics and Gynecology

## 2023-03-08 DIAGNOSIS — N939 Abnormal uterine and vaginal bleeding, unspecified: Secondary | ICD-10-CM | POA: Diagnosis not present

## 2023-03-13 ENCOUNTER — Telehealth: Payer: Self-pay | Admitting: Obstetrics and Gynecology

## 2023-03-13 ENCOUNTER — Encounter: Payer: Self-pay | Admitting: Obstetrics and Gynecology

## 2023-03-13 ENCOUNTER — Other Ambulatory Visit: Payer: Self-pay

## 2023-03-13 NOTE — Telephone Encounter (Signed)
Attempted to reach patient for a scheduled appointment requested by Dr. Briscoe Deutscher. Left a voicemail message for patient to call the office and schedule.

## 2023-03-13 NOTE — Telephone Encounter (Signed)
-----   Message from Lorriane Shire, MD sent at 03/13/2023  8:33 AM EDT ----- Needs follow up

## 2023-03-14 ENCOUNTER — Encounter: Payer: Self-pay | Admitting: *Deleted

## 2023-03-14 ENCOUNTER — Other Ambulatory Visit: Payer: Self-pay

## 2023-03-14 NOTE — Telephone Encounter (Signed)
Per Dr. Briscoe Deutscher she does not feel comfortable filling prescription, recommends patient follow up with PCP or MH/Pscy. I called Janann and left a message unfortunately we are unable to fill the prescription she requested and recommend she follow up with her PCP and may call us with questions. I will also send a MyChart message. Nancy Fetter

## 2023-03-14 NOTE — Telephone Encounter (Signed)
Patient also called today and left message about getting test results. I called her back and reviewed Dr. Briscoe Deutscher message to her re: her [place] results. I explained she recommends she have an endometrial biopsy next and then depending on those results consider IUD, hysterctomy or Uterine artery embolization. I explained what a endometrial biopsy is and that registrar will contact her with appointment. I also explained what uterine artery embolization and that Dr. Briscoe Deutscher will discuss more at her visit. We also discussed that her bleeding stopped for a day and a half but then started back . We discussed Dr. Briscoe Deutscher will discuss with her at her endometrial biopsy appointment and that I will ask registrar to schedule asap. She voices understanding. She also asked for medicine for anxiety / depression. She states she does not go to a counselor or therapist. I offered referral to [facility] which she declined. She states she only wants the medicine. She states she has taken xanax in the past with relief and that she is having issues because of the bleeding for so long. I explained I will route to Dr. Briscoe Deutscher and see if she approves rx and we will let her know.  Nancy Fetter

## 2023-03-18 ENCOUNTER — Inpatient Hospital Stay (HOSPITAL_COMMUNITY)
Admission: EM | Admit: 2023-03-18 | Discharge: 2023-03-21 | DRG: 872 | Disposition: A | Discharge status: Home / Self Care | Payer: Medicare Other | Attending: Internal Medicine | Admitting: Internal Medicine

## 2023-03-18 ENCOUNTER — Emergency Department (HOSPITAL_COMMUNITY): Payer: Medicare Other

## 2023-03-18 ENCOUNTER — Other Ambulatory Visit: Payer: Self-pay

## 2023-03-18 ENCOUNTER — Encounter (HOSPITAL_COMMUNITY): Payer: Self-pay

## 2023-03-18 DIAGNOSIS — N3000 Acute cystitis without hematuria: Secondary | ICD-10-CM | POA: Diagnosis not present

## 2023-03-18 DIAGNOSIS — Z7951 Long term (current) use of inhaled steroids: Secondary | ICD-10-CM

## 2023-03-18 DIAGNOSIS — Z79899 Other long term (current) drug therapy: Secondary | ICD-10-CM

## 2023-03-18 DIAGNOSIS — F17210 Nicotine dependence, cigarettes, uncomplicated: Secondary | ICD-10-CM | POA: Diagnosis present

## 2023-03-18 DIAGNOSIS — G8929 Other chronic pain: Secondary | ICD-10-CM | POA: Diagnosis not present

## 2023-03-18 DIAGNOSIS — R6520 Severe sepsis without septic shock: Secondary | ICD-10-CM | POA: Diagnosis not present

## 2023-03-18 DIAGNOSIS — I10 Essential (primary) hypertension: Secondary | ICD-10-CM | POA: Diagnosis present

## 2023-03-18 DIAGNOSIS — R9431 Abnormal electrocardiogram [ECG] [EKG]: Secondary | ICD-10-CM | POA: Diagnosis present

## 2023-03-18 DIAGNOSIS — K219 Gastro-esophageal reflux disease without esophagitis: Secondary | ICD-10-CM | POA: Diagnosis present

## 2023-03-18 DIAGNOSIS — E872 Acidosis, unspecified: Secondary | ICD-10-CM | POA: Diagnosis present

## 2023-03-18 DIAGNOSIS — Z886 Allergy status to analgesic agent status: Secondary | ICD-10-CM

## 2023-03-18 DIAGNOSIS — Z888 Allergy status to other drugs, medicaments and biological substances status: Secondary | ICD-10-CM

## 2023-03-18 DIAGNOSIS — A419 Sepsis, unspecified organism: Secondary | ICD-10-CM | POA: Diagnosis not present

## 2023-03-18 DIAGNOSIS — J449 Chronic obstructive pulmonary disease, unspecified: Secondary | ICD-10-CM | POA: Diagnosis present

## 2023-03-18 DIAGNOSIS — Z8249 Family history of ischemic heart disease and other diseases of the circulatory system: Secondary | ICD-10-CM | POA: Diagnosis not present

## 2023-03-18 DIAGNOSIS — Z5181 Encounter for therapeutic drug level monitoring: Secondary | ICD-10-CM | POA: Diagnosis not present

## 2023-03-18 DIAGNOSIS — Z79891 Long term (current) use of opiate analgesic: Secondary | ICD-10-CM | POA: Diagnosis not present

## 2023-03-18 DIAGNOSIS — I130 Hypertensive heart and chronic kidney disease with heart failure and stage 1 through stage 4 chronic kidney disease, or unspecified chronic kidney disease: Secondary | ICD-10-CM | POA: Diagnosis not present

## 2023-03-18 DIAGNOSIS — E1142 Type 2 diabetes mellitus with diabetic polyneuropathy: Secondary | ICD-10-CM | POA: Diagnosis present

## 2023-03-18 DIAGNOSIS — Z882 Allergy status to sulfonamides status: Secondary | ICD-10-CM

## 2023-03-18 DIAGNOSIS — E1122 Type 2 diabetes mellitus with diabetic chronic kidney disease: Secondary | ICD-10-CM | POA: Diagnosis present

## 2023-03-18 DIAGNOSIS — I5032 Chronic diastolic (congestive) heart failure: Secondary | ICD-10-CM | POA: Diagnosis present

## 2023-03-18 DIAGNOSIS — R112 Nausea with vomiting, unspecified: Secondary | ICD-10-CM | POA: Diagnosis present

## 2023-03-18 DIAGNOSIS — N179 Acute kidney failure, unspecified: Secondary | ICD-10-CM | POA: Diagnosis not present

## 2023-03-18 DIAGNOSIS — E874 Mixed disorder of acid-base balance: Secondary | ICD-10-CM | POA: Diagnosis present

## 2023-03-18 DIAGNOSIS — E876 Hypokalemia: Secondary | ICD-10-CM | POA: Diagnosis present

## 2023-03-18 DIAGNOSIS — N3001 Acute cystitis with hematuria: Secondary | ICD-10-CM | POA: Diagnosis not present

## 2023-03-18 DIAGNOSIS — E119 Type 2 diabetes mellitus without complications: Secondary | ICD-10-CM

## 2023-03-18 DIAGNOSIS — D573 Sickle-cell trait: Secondary | ICD-10-CM | POA: Diagnosis not present

## 2023-03-18 DIAGNOSIS — J4489 Other specified chronic obstructive pulmonary disease: Secondary | ICD-10-CM | POA: Diagnosis present

## 2023-03-18 DIAGNOSIS — Z794 Long term (current) use of insulin: Secondary | ICD-10-CM

## 2023-03-18 DIAGNOSIS — F419 Anxiety disorder, unspecified: Secondary | ICD-10-CM | POA: Diagnosis present

## 2023-03-18 DIAGNOSIS — Z833 Family history of diabetes mellitus: Secondary | ICD-10-CM

## 2023-03-18 DIAGNOSIS — Z6841 Body Mass Index (BMI) 40.0 and over, adult: Secondary | ICD-10-CM

## 2023-03-18 DIAGNOSIS — E860 Dehydration: Secondary | ICD-10-CM | POA: Diagnosis present

## 2023-03-18 DIAGNOSIS — I160 Hypertensive urgency: Secondary | ICD-10-CM | POA: Diagnosis not present

## 2023-03-18 DIAGNOSIS — R109 Unspecified abdominal pain: Secondary | ICD-10-CM | POA: Diagnosis not present

## 2023-03-18 DIAGNOSIS — N939 Abnormal uterine and vaginal bleeding, unspecified: Secondary | ICD-10-CM | POA: Diagnosis present

## 2023-03-18 DIAGNOSIS — G894 Chronic pain syndrome: Secondary | ICD-10-CM | POA: Diagnosis not present

## 2023-03-18 DIAGNOSIS — G629 Polyneuropathy, unspecified: Secondary | ICD-10-CM | POA: Diagnosis not present

## 2023-03-18 DIAGNOSIS — M25512 Pain in left shoulder: Secondary | ICD-10-CM | POA: Diagnosis not present

## 2023-03-18 DIAGNOSIS — R197 Diarrhea, unspecified: Secondary | ICD-10-CM

## 2023-03-18 DIAGNOSIS — E11649 Type 2 diabetes mellitus with hypoglycemia without coma: Secondary | ICD-10-CM | POA: Diagnosis present

## 2023-03-18 DIAGNOSIS — M5416 Radiculopathy, lumbar region: Secondary | ICD-10-CM | POA: Diagnosis not present

## 2023-03-18 DIAGNOSIS — N1831 Chronic kidney disease, stage 3a: Secondary | ICD-10-CM | POA: Diagnosis present

## 2023-03-18 DIAGNOSIS — E861 Hypovolemia: Secondary | ICD-10-CM | POA: Diagnosis present

## 2023-03-18 DIAGNOSIS — M25511 Pain in right shoulder: Secondary | ICD-10-CM | POA: Diagnosis not present

## 2023-03-18 DIAGNOSIS — R079 Chest pain, unspecified: Secondary | ICD-10-CM | POA: Diagnosis not present

## 2023-03-18 DIAGNOSIS — M170 Bilateral primary osteoarthritis of knee: Secondary | ICD-10-CM | POA: Diagnosis not present

## 2023-03-18 LAB — COMPREHENSIVE METABOLIC PANEL
ALT: 11 U/L (ref 0–44)
AST: 25 U/L (ref 15–41)
Albumin: 4.4 g/dL (ref 3.5–5.0)
Alkaline Phosphatase: 86 U/L (ref 38–126)
Anion gap: 19 — ABNORMAL HIGH (ref 5–15)
BUN: 22 mg/dL — ABNORMAL HIGH (ref 6–20)
CO2: 19 mmol/L — ABNORMAL LOW (ref 22–32)
Calcium: 10.7 mg/dL — ABNORMAL HIGH (ref 8.9–10.3)
Chloride: 96 mmol/L — ABNORMAL LOW (ref 98–111)
Creatinine, Ser: 2.94 mg/dL — ABNORMAL HIGH (ref 0.44–1.00)
GFR, Estimated: 19 mL/min — ABNORMAL LOW (ref >60)
Glucose, Bld: 282 mg/dL — ABNORMAL HIGH (ref 70–99)
Potassium: 3.4 mmol/L — ABNORMAL LOW (ref 3.5–5.1)
Sodium: 134 mmol/L — ABNORMAL LOW (ref 135–145)
Total Bilirubin: 1 mg/dL (ref 0.3–1.2)
Total Protein: 7.8 g/dL (ref 6.5–8.1)

## 2023-03-18 LAB — I-STAT VENOUS BLOOD GAS, ED
Acid-Base Excess: 0 mmol/L (ref 0.0–2.0)
Bicarbonate: 16.6 mmol/L — ABNORMAL LOW (ref 20.0–28.0)
Calcium, Ion: 1.12 mmol/L — ABNORMAL LOW (ref 1.15–1.40)
HCT: 51 % — ABNORMAL HIGH (ref 36.0–46.0)
Hemoglobin: 17.3 g/dL — ABNORMAL HIGH (ref 12.0–15.0)
O2 Saturation: 96 %
Potassium: 3.1 mmol/L — ABNORMAL LOW (ref 3.5–5.1)
Sodium: 135 mmol/L (ref 135–145)
TCO2: 17 mmol/L — ABNORMAL LOW (ref 22–32)
pCO2, Ven: 15.3 mmHg — CL (ref 44–60)
pH, Ven: 7.643 (ref 7.25–7.43)
pO2, Ven: 59 mmHg — ABNORMAL HIGH (ref 32–45)

## 2023-03-18 LAB — I-STAT BETA HCG BLOOD, ED (MC, WL, AP ONLY): I-stat hCG, quantitative: <5 m[IU]/mL (ref <5)

## 2023-03-18 LAB — CBG MONITORING, ED
Glucose-Capillary: 201 mg/dL — ABNORMAL HIGH (ref 70–99)
Glucose-Capillary: 57 mg/dL — ABNORMAL LOW (ref 70–99)
Glucose-Capillary: 50 mg/dL — ABNORMAL LOW (ref 70–99)

## 2023-03-18 LAB — URINALYSIS, ROUTINE W REFLEX MICROSCOPIC
Bilirubin Urine: NEGATIVE
Glucose, UA: NEGATIVE mg/dL
Ketones, ur: NEGATIVE mg/dL
Nitrite: NEGATIVE
Protein, ur: 100 mg/dL — AB
RBC / HPF: >50 RBC/hpf (ref 0–5)
Specific Gravity, Urine: 1.036 — ABNORMAL HIGH (ref 1.005–1.030)
WBC, UA: >50 WBC/hpf (ref 0–5)
pH: 5 (ref 5.0–8.0)

## 2023-03-18 LAB — I-STAT CHEM 8, ED
BUN: 25 mg/dL — ABNORMAL HIGH (ref 6–20)
Calcium, Ion: 1.14 mmol/L — ABNORMAL LOW (ref 1.15–1.40)
Chloride: 100 mmol/L (ref 98–111)
Creatinine, Ser: 3 mg/dL — ABNORMAL HIGH (ref 0.44–1.00)
Glucose, Bld: 187 mg/dL — ABNORMAL HIGH (ref 70–99)
HCT: 52 % — ABNORMAL HIGH (ref 36.0–46.0)
Hemoglobin: 17.7 g/dL — ABNORMAL HIGH (ref 12.0–15.0)
Potassium: 3.1 mmol/L — ABNORMAL LOW (ref 3.5–5.1)
Sodium: 137 mmol/L (ref 135–145)
TCO2: 18 mmol/L — ABNORMAL LOW (ref 22–32)

## 2023-03-18 LAB — CBC
HCT: 46.8 % — ABNORMAL HIGH (ref 36.0–46.0)
Hemoglobin: 16.2 g/dL — ABNORMAL HIGH (ref 12.0–15.0)
MCH: 26.7 pg (ref 26.0–34.0)
MCHC: 34.6 g/dL (ref 30.0–36.0)
MCV: 77.2 fL — ABNORMAL LOW (ref 80.0–100.0)
Platelets: 247 10*3/uL (ref 150–400)
RBC: 6.06 MIL/uL — ABNORMAL HIGH (ref 3.87–5.11)
RDW: 16.2 % — ABNORMAL HIGH (ref 11.5–15.5)
WBC: 13.4 10*3/uL — ABNORMAL HIGH (ref 4.0–10.5)
nRBC: 0 % (ref 0.0–0.2)

## 2023-03-18 LAB — LACTIC ACID, PLASMA
Lactic Acid, Venous: 5.5 mmol/L (ref 0.5–1.9)
Lactic Acid, Venous: 2.7 mmol/L (ref 0.5–1.9)

## 2023-03-18 LAB — GLUCOSE, CAPILLARY
Glucose-Capillary: 108 mg/dL — ABNORMAL HIGH (ref 70–99)
Glucose-Capillary: 108 mg/dL — ABNORMAL HIGH (ref 70–99)

## 2023-03-18 LAB — TROPONIN I (HIGH SENSITIVITY)
Troponin I (High Sensitivity): 18 ng/L — ABNORMAL HIGH (ref <18)
Troponin I (High Sensitivity): 15 ng/L (ref <18)

## 2023-03-18 LAB — LIPASE, BLOOD: Lipase: 21 U/L (ref 11–51)

## 2023-03-18 MED ORDER — MELATONIN 3 MG PO TABS
3.0000 mg | ORAL_TABLET | Freq: Every evening | ORAL | Status: DC | PRN
  Administered 2023-03-18: 3 mg via ORAL
  Filled 2023-03-18: qty 1

## 2023-03-18 MED ORDER — INSULIN GLARGINE-YFGN 100 UNIT/ML ~~LOC~~ SOLN
15.0000 [IU] | Freq: Every day | SUBCUTANEOUS | Status: DC
  Filled 2023-03-18: qty 0.15

## 2023-03-18 MED ORDER — HEPARIN SODIUM (PORCINE) 5000 UNIT/ML IJ SOLN
5000.0000 [IU] | Freq: Three times a day (TID) | INTRAMUSCULAR | Status: DC
  Administered 2023-03-18 – 2023-03-21 (×8): 5000 [IU] via SUBCUTANEOUS
  Filled 2023-03-18 (×8): qty 1

## 2023-03-18 MED ORDER — PANTOPRAZOLE SODIUM 40 MG PO TBEC
40.0000 mg | DELAYED_RELEASE_TABLET | Freq: Every day | ORAL | Status: DC
  Administered 2023-03-19 – 2023-03-21 (×3): 40 mg via ORAL
  Filled 2023-03-18 (×3): qty 1

## 2023-03-18 MED ORDER — SODIUM CHLORIDE 0.9% FLUSH
3.0000 mL | Freq: Two times a day (BID) | INTRAVENOUS | Status: DC
  Administered 2023-03-19 – 2023-03-21 (×5): 3 mL via INTRAVENOUS

## 2023-03-18 MED ORDER — ACETAMINOPHEN 650 MG RE SUPP: 650.0000 mg | Freq: Four times a day (QID) | RECTAL | Status: DC | PRN

## 2023-03-18 MED ORDER — ONDANSETRON HCL 4 MG/2ML IJ SOLN
4.0000 mg | Freq: Once | INTRAMUSCULAR | Status: AC
  Administered 2023-03-18: 4 mg via INTRAVENOUS
  Filled 2023-03-18: qty 2

## 2023-03-18 MED ORDER — SODIUM CHLORIDE 0.9 % IV BOLUS
1000.0000 mL | Freq: Once | INTRAVENOUS | Status: AC
  Administered 2023-03-18: 1000 mL via INTRAVENOUS

## 2023-03-18 MED ORDER — METOPROLOL SUCCINATE ER 100 MG PO TB24
100.0000 mg | ORAL_TABLET | Freq: Every day | ORAL | Status: DC
  Administered 2023-03-19 – 2023-03-21 (×3): 100 mg via ORAL
  Filled 2023-03-18 (×3): qty 1

## 2023-03-18 MED ORDER — UMECLIDINIUM BROMIDE 62.5 MCG/ACT IN AEPB
1.0000 | INHALATION_SPRAY | Freq: Every day | RESPIRATORY_TRACT | Status: DC
  Administered 2023-03-19 – 2023-03-21 (×3): 1 via RESPIRATORY_TRACT
  Filled 2023-03-18 (×2): qty 7

## 2023-03-18 MED ORDER — SODIUM CHLORIDE 0.9 % IV SOLN
2.0000 g | INTRAVENOUS | Status: DC
  Administered 2023-03-18 – 2023-03-20 (×3): 2 g via INTRAVENOUS
  Filled 2023-03-18 (×3): qty 20

## 2023-03-18 MED ORDER — HYDROMORPHONE HCL 1 MG/ML IJ SOLN
1.0000 mg | Freq: Once | INTRAMUSCULAR | Status: AC
  Administered 2023-03-18: 1 mg via INTRAVENOUS
  Filled 2023-03-18: qty 1

## 2023-03-18 MED ORDER — ONDANSETRON 4 MG PO TBDP
4.0000 mg | ORAL_TABLET | Freq: Once | ORAL | Status: AC | PRN
  Administered 2023-03-18: 4 mg via ORAL
  Filled 2023-03-18: qty 1

## 2023-03-18 MED ORDER — DEXTROSE 50 % IV SOLN
25.0000 mL | Freq: Once | INTRAVENOUS | Status: AC
  Administered 2023-03-18: 25 mL via INTRAVENOUS
  Filled 2023-03-18: qty 50

## 2023-03-18 MED ORDER — HYDROMORPHONE HCL 1 MG/ML IJ SOLN
1.0000 mg | INTRAMUSCULAR | Status: DC | PRN
  Administered 2023-03-18 – 2023-03-20 (×8): 1 mg via INTRAVENOUS
  Filled 2023-03-18 (×8): qty 1

## 2023-03-18 MED ORDER — POTASSIUM CHLORIDE 10 MEQ/100ML IV SOLN
10.0000 meq | INTRAVENOUS | Status: AC
  Administered 2023-03-18 (×2): 10 meq via INTRAVENOUS
  Filled 2023-03-18 (×2): qty 100

## 2023-03-18 MED ORDER — INSULIN GLARGINE-YFGN 100 UNIT/ML ~~LOC~~ SOLN
30.0000 [IU] | Freq: Every day | SUBCUTANEOUS | Status: DC
  Filled 2023-03-18: qty 0.3

## 2023-03-18 MED ORDER — LACTATED RINGERS IV SOLN: INTRAVENOUS | Status: DC

## 2023-03-18 MED ORDER — INSULIN ASPART 100 UNIT/ML IJ SOLN
0.0000 [IU] | INTRAMUSCULAR | Status: DC
  Administered 2023-03-19: 3 [IU] via SUBCUTANEOUS
  Administered 2023-03-19: 9 [IU] via SUBCUTANEOUS
  Administered 2023-03-19: 5 [IU] via SUBCUTANEOUS
  Administered 2023-03-19: 2 [IU] via SUBCUTANEOUS
  Administered 2023-03-20: 3 [IU] via SUBCUTANEOUS
  Administered 2023-03-20: 7 [IU] via SUBCUTANEOUS
  Administered 2023-03-20 (×2): 3 [IU] via SUBCUTANEOUS
  Administered 2023-03-20 – 2023-03-21 (×4): 5 [IU] via SUBCUTANEOUS
  Administered 2023-03-21: 3 [IU] via SUBCUTANEOUS

## 2023-03-18 MED ORDER — ROSUVASTATIN CALCIUM 20 MG PO TABS
20.0000 mg | ORAL_TABLET | Freq: Every day | ORAL | Status: DC
  Administered 2023-03-19 – 2023-03-21 (×3): 20 mg via ORAL
  Filled 2023-03-18 (×3): qty 1

## 2023-03-18 MED ORDER — PROCHLORPERAZINE EDISYLATE 10 MG/2ML IJ SOLN
5.0000 mg | Freq: Four times a day (QID) | INTRAMUSCULAR | Status: DC | PRN
  Administered 2023-03-18 – 2023-03-19 (×3): 5 mg via INTRAVENOUS
  Filled 2023-03-18 (×3): qty 2

## 2023-03-18 MED ORDER — ACETAMINOPHEN 325 MG PO TABS: 650.0000 mg | ORAL_TABLET | Freq: Four times a day (QID) | ORAL | Status: DC | PRN

## 2023-03-18 MED ORDER — OXYCODONE HCL 5 MG PO TABS
5.0000 mg | ORAL_TABLET | Freq: Four times a day (QID) | ORAL | Status: DC | PRN
  Administered 2023-03-18 – 2023-03-21 (×9): 10 mg via ORAL
  Filled 2023-03-18 (×10): qty 2

## 2023-03-18 MED ORDER — LABETALOL HCL 5 MG/ML IV SOLN: 10.0000 mg | INTRAVENOUS | Status: DC | PRN

## 2023-03-18 MED ORDER — IOHEXOL 350 MG/ML SOLN
75.0000 mL | Freq: Once | INTRAVENOUS | Status: AC | PRN
  Administered 2023-03-18: 75 mL via INTRAVENOUS

## 2023-03-18 MED ORDER — SODIUM CHLORIDE 0.9 % IV SOLN: 1.0000 g | Freq: Once | INTRAVENOUS | Status: DC

## 2023-03-18 MED ORDER — TIOTROPIUM BROMIDE MONOHYDRATE 2.5 MCG/ACT IN AERS: 2.0000 | INHALATION_SPRAY | Freq: Every day | RESPIRATORY_TRACT | Status: DC

## 2023-03-18 NOTE — ED Triage Notes (Signed)
Pt came in via POV d/t back pain, n/v, diarrhea & CP that radiates to her neck. A/Ox4, rates pain 10/10. Does see a pain clinic & reports she has 5 pills left, enough to make it until her refill in 3 days.

## 2023-03-18 NOTE — ED Notes (Signed)
Pt given 4 oz of orange juice for CBG 57

## 2023-03-18 NOTE — ED Notes (Signed)
ED TO INPATIENT HANDOFF REPORT  ED Nurse Name and Phone #: Morrie Sheldon RN [phone]  S Name/Age/Gender Jill Shaw 47 y.o. female Room/Bed: 035C/035C  Code Status   Code Status: Full Code  Home/SNF/Other Home Patient oriented to: self, place, time, and situation Is this baseline? Yes   Triage Complete: Triage complete  Chief Complaint Acute renal failure superimposed on stage 3 chronic kidney disease, unspecified acute renal failure type, unspecified whether stage 3a or 3b CKD (HCC) [N17.9, N18.30]  Triage Note Pt came in via POV d/t back pain, n/v, diarrhea & CP that radiates to her neck. A/Ox4, rates pain 10/10. Does see a pain clinic & reports she has 5 pills left, enough to make it until her refill in 3 days.     Allergies Allergies  Allergen Reactions   Elavil [Amitriptyline] Other (See Comments)    Coma   Orudis [Ketoprofen] Nausea And Vomiting   Desyrel [Trazodone] Nausea And Vomiting   Aspirin Nausea Only   Motrin [Ibuprofen] Nausea And Vomiting   Naprosyn [Naproxen] Nausea And Vomiting   Neurontin [Gabapentin] Nausea And Vomiting and Other (See Comments)    upset stomach   Sulfa Antibiotics Nausea And Vomiting   Ultram [Tramadol] Nausea And Vomiting and Other (See Comments)    stomach upset   Victoza [Liraglutide] Nausea And Vomiting   Zegerid [Omeprazole-Sodium Bicarbonate] Nausea And Vomiting    Level of Care/Admitting Diagnosis ED Disposition     ED Disposition  Admit   Condition  --   Comment  Hospital Area: [facility] [[id number]]  Level of Care: Progressive [102]  Admit to Progressive based on following criteria: MULTISYSTEM THREATS such as stable sepsis, metabolic/electrolyte imbalance with or without encephalopathy that is responding to early treatment.  May admit patient to [facility] or [facility] if equivalent level of care is available:: Yes  Covid Evaluation: Asymptomatic - no recent exposure (last 10 days) testing not  required  Diagnosis: Acute renal failure superimposed on stage 3 chronic kidney disease, unspecified acute renal failure type, unspecified whether stage 3a or 3b CKD [facility]) [[id number]]  Admitting Physician: Briscoe Deutscher [[id number]]  Attending Physician: Briscoe Deutscher [[id number]]  Certification:: I certify this patient will need inpatient services for at least 2 midnights  Estimated Length of Stay: 3          B Medical/Surgery History Past Medical History:  Diagnosis Date   Anemia    Anxiety    Arthritis    knees, hands   Asthma    Chronic diastolic (congestive) heart failure (HCC)    COPD (chronic obstructive pulmonary disease) (HCC)    Diabetes mellitus (HCC)    DKA (diabetic ketoacidosis) (HCC)    Dysfunctional uterine bleeding    Gastritis    GERD (gastroesophageal reflux disease)    Hypertension    Intractable nausea and vomiting 03/23/2021   Neuromuscular disorder (HCC)    neuropathy feet   Seizures (HCC) 09/12/2017   pt states r/t stress and blood sugar - no meds last one 4 months ago, not seen neurologist   Sickle cell trait (HCC)    Smoker    Vitamin D deficiency 10/2019   Wears glasses    Past Surgical History:  Procedure Laterality Date   CESAREAN SECTION     x 1. for twins   DILATION AND CURETTAGE OF UTERUS N/A 08/20/2019   Procedure: DILATATION AND CURETTAGE;  Surgeon: Allie Bossier, MD;  Location: [facility] OR;  Service: Gynecology;  Laterality: N/A;   ENDOMETRIAL ABLATION N/A 08/20/2019   Procedure: Minerva Ablation;  Surgeon: Allie Bossier, MD;  Location: [facility] OR;  Service: Gynecology;  Laterality: N/A;   EYE SURGERY Bilateral    laser right and cataract removed left eye   LEFT HEART CATH AND CORONARY ANGIOGRAPHY N/A 08/30/2021   Procedure: LEFT HEART CATH AND CORONARY ANGIOGRAPHY;  Surgeon: Elder Negus, MD;  Location: [facility] INVASIVE CV LAB;  Service: Cardiovascular;  Laterality: N/A;   RADIOLOGY WITH ANESTHESIA N/A 09/16/2019   Procedure: MRI WITH ANESTHESIA    L SPINE WITHOUT CONTRAST, T SPINE WITHOUT CONTRAST , CERVICAL WITHOUT CONTRAST;  Surgeon: Radiologist, Medication, MD;  Location: [facility] OR;  Service: Radiology;  Laterality: N/A;   TUBAL LIGATION     interval BTL   UPPER GI ENDOSCOPY  07/2017     A IV Location/Drains/Wounds Patient Lines/Drains/Airways Status     Active Line/Drains/Airways     Name Placement date Placement time Site Days   Peripheral IV 03/18/23 20 G Anterior;Proximal;Right Forearm 03/18/23  1805  Forearm  less than 1   Peripheral IV 03/18/23 20 G Left;Posterior Wrist 03/18/23  2008  Wrist  less than 1            Intake/Output Last 24 hours No intake or output data in the 24 hours ending 03/18/23 2128  Labs/Imaging Results for orders placed or performed during the hospital encounter of 03/18/23 (from the past 48 hour(s))  Urinalysis, Routine w reflex microscopic -Urine, Clean Catch     Status: Abnormal   Collection Time: 03/18/23  5:33 PM  Result Value Ref Range   Color, Urine YELLOW YELLOW   APPearance TURBID (A) CLEAR   Specific Gravity, Urine 1.036 (H) 1.005 - 1.030   pH 5.0 5.0 - 8.0   Glucose, UA NEGATIVE NEGATIVE mg/dL   Hgb urine dipstick LARGE (A) NEGATIVE   Bilirubin Urine NEGATIVE NEGATIVE   Ketones, ur NEGATIVE NEGATIVE mg/dL   Protein, ur 147 (A) NEGATIVE mg/dL   Nitrite NEGATIVE NEGATIVE   Leukocytes,Ua LARGE (A) NEGATIVE   RBC / HPF >50 0 - 5 RBC/hpf   WBC, UA >50 0 - 5 WBC/hpf   Bacteria, UA MANY (A) NONE SEEN   Squamous Epithelial / HPF 6-10 0 - 5 /HPF   WBC Clumps PRESENT    Mucus PRESENT     Comment: Performed at [facility], [address] [place], [place] [address]  Lipase, blood     Status: None   Collection Time: 03/18/23  5:41 PM  Result Value Ref Range   Lipase 21 11 - 51 U/L    Comment: Performed at [facility], [address], [place], [place] [address]  Comprehensive metabolic panel     Status: Abnormal   Collection Time: 03/18/23  5:41 PM  Result Value  Ref Range   Sodium 134 (L) 135 - 145 mmol/L   Potassium 3.4 (L) 3.5 - 5.1 mmol/L   Chloride 96 (L) 98 - 111 mmol/L   CO2 19 (L) 22 - 32 mmol/L   Glucose, Bld 282 (H) 70 - 99 mg/dL    Comment: Glucose reference range applies only to samples taken after fasting for at least 8 hours.   BUN 22 (H) 6 - 20 mg/dL   Creatinine, Ser 6.57 (H) 0.44 - 1.00 mg/dL   Calcium 84.6 (H) 8.9 - 10.3 mg/dL   Total Protein 7.8 6.5 - 8.1 g/dL   Albumin 4.4 3.5 - 5.0  g/dL   AST 25 15 - 41 U/L   ALT 11 0 - 44 U/L   Alkaline Phosphatase 86 38 - 126 U/L   Total Bilirubin 1.0 0.3 - 1.2 mg/dL   GFR, Estimated 19 (L) >60 mL/min    Comment: (NOTE) Calculated using the CKD-EPI Creatinine Equation (2021)    Anion gap 19 (H) 5 - 15    Comment: Performed at [facility], [address], [place], [place] [address]  CBC     Status: Abnormal   Collection Time: 03/18/23  5:41 PM  Result Value Ref Range   WBC 13.4 (H) 4.0 - 10.5 K/uL   RBC 6.06 (H) 3.87 - 5.11 MIL/uL   Hemoglobin 16.2 (H) 12.0 - 15.0 g/dL   HCT 60.4 (H) 54.0 - 98.1 %   MCV 77.2 (L) 80.0 - 100.0 fL   MCH 26.7 26.0 - 34.0 pg   MCHC 34.6 30.0 - 36.0 g/dL   RDW 19.1 (H) 47.8 - 29.5 %   Platelets 247 150 - 400 K/uL    Comment: REPEATED TO VERIFY   nRBC 0.0 0.0 - 0.2 %    Comment: Performed at [facility], [address] [place], [place] [address]  Troponin I (High Sensitivity)     Status: Abnormal   Collection Time: 03/18/23  5:41 PM  Result Value Ref Range   Troponin I (High Sensitivity) 18 (H) <18 ng/L    Comment: (NOTE) Elevated high sensitivity troponin I (hsTnI) values and significant  changes across serial measurements may suggest ACS but many other  chronic and acute conditions are known to elevate hsTnI results.  Refer to the "Links" section for chest pain algorithms and additional  guidance. Performed at [facility], [address] [place], [place] [address]   CBG monitoring, ED     Status: Abnormal   Collection  Time: 03/18/23  6:10 PM  Result Value Ref Range   Glucose-Capillary 201 (H) 70 - 99 mg/dL    Comment: Glucose reference range applies only to samples taken after fasting for at least 8 hours.   Comment 1 Notify RN    Comment 2 Document in Chart   Lactic acid, plasma     Status: Abnormal   Collection Time: 03/18/23  6:18 PM  Result Value Ref Range   Lactic Acid, Venous 5.5 (HH) 0.5 - 1.9 mmol/L    Comment: CRITICAL RESULT CALLED TO, READ BACK BY AND VERIFIED WITH A,Valeria Boza RN @1901  03/18/23 E,[place] Performed at [facility], [address] [place], [place] [address]   I-Stat beta hCG blood, ED ([facility], [facility], [facility] only)     Status: None   Collection Time: 03/18/23  6:22 PM  Result Value Ref Range   I-stat hCG, quantitative <5.0 <5 mIU/mL   Comment 3            Comment:   GEST. AGE      CONC.  (mIU/mL)   <=1 WEEK        5 - 50     2 WEEKS       50 - 500     3 WEEKS       100 - 10,000     4 WEEKS     1,000 - 30,000        FEMALE AND NON-PREGNANT FEMALE:     LESS THAN 5 mIU/mL   I-stat chem 8, ED (not at [facility], [facility] or [facility])  Status: Abnormal   Collection Time: 03/18/23  6:35 PM  Result Value Ref Range   Sodium 137 135 - 145 mmol/L   Potassium 3.1 (L) 3.5 - 5.1 mmol/L   Chloride 100 98 - 111 mmol/L   BUN 25 (H) 6 - 20 mg/dL   Creatinine, Ser 1.61 (H) 0.44 - 1.00 mg/dL   Glucose, Bld 096 (H) 70 - 99 mg/dL    Comment: Glucose reference range applies only to samples taken after fasting for at least 8 hours.   Calcium, Ion 1.14 (L) 1.15 - 1.40 mmol/L   TCO2 18 (L) 22 - 32 mmol/L   Hemoglobin 17.7 (H) 12.0 - 15.0 g/dL   HCT 04.5 (H) 40.9 - 81.1 %  I-Stat venous blood gas, ([facility] ED, [facility], [facility])     Status: Abnormal   Collection Time: 03/18/23  6:35 PM  Result Value Ref Range   pH, Ven 7.643 (HH) 7.25 - 7.43   pCO2, Ven 15.3 (LL) 44 - 60 mmHg   pO2, Ven 59 (H) 32 - 45 mmHg   Bicarbonate 16.6 (L) 20.0 - 28.0 mmol/L   TCO2 17 (L) 22 - 32 mmol/L   O2 Saturation 96 %   Acid-Base Excess 0.0  0.0 - 2.0 mmol/L   Sodium 135 135 - 145 mmol/L   Potassium 3.1 (L) 3.5 - 5.1 mmol/L   Calcium, Ion 1.12 (L) 1.15 - 1.40 mmol/L   HCT 51.0 (H) 36.0 - 46.0 %   Hemoglobin 17.3 (H) 12.0 - 15.0 g/dL   Sample type VENOUS    Comment NOTIFIED PHYSICIAN   CBG monitoring, ED     Status: Abnormal   Collection Time: 03/18/23  8:18 PM  Result Value Ref Range   Glucose-Capillary 57 (L) 70 - 99 mg/dL    Comment: Glucose reference range applies only to samples taken after fasting for at least 8 hours.  CBG monitoring, ED     Status: Abnormal   Collection Time: 03/18/23  9:21 PM  Result Value Ref Range   Glucose-Capillary 50 (L) 70 - 99 mg/dL    Comment: Glucose reference range applies only to samples taken after fasting for at least 8 hours.   *Note: Due to a large number of results and/or encounters for the requested time period, some results have not been displayed. A complete set of results can be found in Results Review.   DG Chest 2 View  Result Date: 03/18/2023 CLINICAL DATA:  Chest pain EXAM: CHEST - 2 VIEW COMPARISON:  December 18, 2022 FINDINGS: Stable cardiomegaly. The hila and mediastinum are normal. No pneumothorax. No nodules or masses. No focal infiltrates. IMPRESSION: No active cardiopulmonary disease. Electronically Signed   By: Gerome Sam III M.D.   On: 03/18/2023 20:03   CT ABDOMEN PELVIS W CONTRAST  Result Date: 03/18/2023 CLINICAL DATA:  Pain right lower quadrant of abdomen EXAM: CT ABDOMEN AND PELVIS WITH CONTRAST TECHNIQUE: Multidetector CT imaging of the abdomen and pelvis was performed using the standard protocol following bolus administration of intravenous contrast. RADIATION DOSE REDUCTION: This exam was performed according to the departmental dose-optimization program which includes automated exposure control, adjustment of the mA and/or kV according to patient size and/or use of iterative reconstruction technique. CONTRAST:  75mL OMNIPAQUE IOHEXOL 350 MG/ML SOLN COMPARISON:   02/18/2023 FINDINGS: Lower chest: Minimal pericardial effusion is seen. Small linear densities in the lower lung fields may suggest minimal scarring. Hepatobiliary: No focal abnormalities are seen in the liver. There is no dilation of  bile ducts. Gallbladder is unremarkable. Pancreas: Pancreas is smaller than usual incised. No focal abnormalities are seen. Spleen: Unremarkable. Adrenals/Urinary Tract: Adrenals are unremarkable. There is no hydronephrosis. There are no renal or ureteral stones. Urinary bladder is unremarkable. Stomach/Bowel: Small hiatal hernia is seen. Stomach is unremarkable. Small bowel loops are not dilated. Appendix is of normal caliber. There is no significant wall thickening in colon. There is no pericolic stranding. Vascular/Lymphatic: Vascular structures are unremarkable. There are subcentimeter nodes in mesentery and adjacent to put a bodies, possibly suggesting reactive hyperplasia of nodes. Reproductive: Uterus is enlarged in size. No dominant adnexal masses are seen. There is possible small calcification in the right adnexa. Other: There is no ascites or pneumoperitoneum. Small umbilical hernia containing fat is seen. Musculoskeletal: No acute findings are seen. IMPRESSION: There is no evidence of intestinal obstruction or pneumoperitoneum. There is no hydronephrosis. Appendix is not dilated. Minimal pericardial effusion.  Small hiatal hernia. Electronically Signed   By: Ernie Avena M.D.   On: 03/18/2023 18:59    Pending Labs Unresulted Labs (From admission, onward)     Start     Ordered   03/19/23 0500  HIV Antibody (routine testing w rflx)  (HIV Antibody (Routine testing w reflex) panel)  Tomorrow morning,   R        03/18/23 2040   03/19/23 0500  Basic metabolic panel  Daily,   R      03/18/23 2040   03/19/23 0500  Magnesium  Tomorrow morning,   R        03/18/23 2040   03/19/23 0500  CBC  Daily,   R      03/18/23 2040   03/18/23 2040  Urine Culture (for  pregnant, neutropenic or urologic patients or patients with an indwelling urinary catheter)  (Urine Culture)  Add-on,   AD       Question:  Indication  Answer:  Suprapubic pain   03/18/23 2040   03/18/23 1953  Blood culture (routine x 2)  BLOOD CULTURE X 2,   R (with STAT occurrences)      03/18/23 1954   03/18/23 1807  Lactic acid, plasma  Now then every 2 hours,   R (with STAT occurrences)      03/18/23 1806            Vitals/Pain Today's Vitals   03/18/23 1900 03/18/23 1957 03/18/23 2120 03/18/23 2122  BP: (!) 178/85  (!) 155/91   Pulse: 96  (!) 101   Resp: (!) 25  16   Temp:    98.5 F (36.9 C)  TempSrc:    Oral  SpO2: 100%  100%   PainSc:  9       Isolation Precautions No active isolations  Medications Medications  cefTRIAXone (ROCEPHIN) 2 g in sodium chloride 0.9 % 100 mL IVPB (2 g Intravenous New Bag/Given 03/18/23 2120)  labetalol (NORMODYNE) injection 10 mg (has no administration in time range)  potassium chloride 10 mEq in 100 mL IVPB (10 mEq Intravenous New Bag/Given 03/18/23 2110)  metoprolol succinate (TOPROL-XL) 24 hr tablet 100 mg (has no administration in time range)  rosuvastatin (CRESTOR) tablet 20 mg (has no administration in time range)  pantoprazole (PROTONIX) EC tablet 40 mg (has no administration in time range)  insulin glargine-yfgn (SEMGLEE) injection 30 Units (has no administration in time range)  insulin aspart (novoLOG) injection 0-9 Units (0 Units Subcutaneous Hold 03/18/23 2057)  heparin injection 5,000 Units (5,000 Units Subcutaneous Given 03/18/23 2120)  sodium chloride flush (NS) 0.9 % injection 3 mL (3 mLs Intravenous Not Given 03/18/23 2122)  acetaminophen (TYLENOL) tablet 650 mg (has no administration in time range)    Or  acetaminophen (TYLENOL) suppository 650 mg (has no administration in time range)  oxyCODONE (Oxy IR/ROXICODONE) immediate release tablet 5-10 mg (10 mg Oral Given 03/18/23 2125)  HYDROmorphone (DILAUDID) injection 1 mg (has no  administration in time range)  umeclidinium bromide (INCRUSE ELLIPTA) 62.5 MCG/ACT 1 puff (has no administration in time range)  lactated ringers infusion ( Intravenous New Bag/Given 03/18/23 2109)  prochlorperazine (COMPAZINE) injection 5 mg (has no administration in time range)  ondansetron (ZOFRAN-ODT) disintegrating tablet 4 mg (4 mg Oral Given 03/18/23 1754)  sodium chloride 0.9 % bolus 1,000 mL (0 mLs Intravenous Stopped 03/18/23 2122)  HYDROmorphone (DILAUDID) injection 1 mg (1 mg Intravenous Given 03/18/23 1810)  iohexol (OMNIPAQUE) 350 MG/ML injection 75 mL (75 mLs Intravenous Contrast Given 03/18/23 1834)  sodium chloride 0.9 % bolus 1,000 mL (0 mLs Intravenous Stopped 03/18/23 2122)  ondansetron (ZOFRAN) injection 4 mg (4 mg Intravenous Given 03/18/23 1902)  sodium chloride 0.9 % bolus 1,000 mL (1,000 mLs Intravenous New Bag/Given 03/18/23 2108)    Mobility walks with person assist     Focused Assessments Renal Assessment Handoff:    R Recommendations: See Admitting Provider Note  Report given to:   Additional Notes:

## 2023-03-18 NOTE — ED Provider Notes (Signed)
[place] EMERGENCY DEPARTMENT AT [facility] Provider Note   CSN: [id number] Arrival date & time: 03/18/23  1714     History  Chief Complaint  Patient presents with   Abdominal Pain   Nausea   Emesis   Back Pain   Diarrhea    Jill Shaw is a 47 y.o. female.   Abdominal Pain Associated symptoms: diarrhea and vomiting   Emesis Associated symptoms: abdominal pain and diarrhea   Back Pain Associated symptoms: abdominal pain   Diarrhea Associated symptoms: abdominal pain and vomiting      This patient is a 47 year old female, she has a history of diabetes, she also has a history of hypertension on multiple different medications, she has been on Megace because of frequent vaginal bleeding and presents today with increasing abdominal pain which has been going on for the last week but gradually getting worse and became acutely worse after getting a transvaginal ultrasound last week.  Yesterday the pain was severe and today she has noted to be very tachypneic though not feeling short of breath, she feels like the pain is severe, radiates to her back, she is nauseated and vomiting.  Home Medications Prior to Admission medications   Medication Sig Start Date End Date Taking? Authorizing Provider  Accu-Chek Softclix Lancets lancets USE TO TEST AS DIRECTED UP TO FOUR TIMES DAILY. 03/06/22   Shamleffer, Konrad Dolores, MD  albuterol (PROVENTIL) (2.5 MG/3ML) 0.083% nebulizer solution Inhale into the lungs. 09/27/20   [provider]  albuterol (VENTOLIN HFA) 108 (90 Base) MCG/ACT inhaler Inhale 2 puffs into the lungs every 6 (six) hours as needed for wheezing or shortness of breath. 07/18/21   Luciano Cutter, MD  B-D ULTRAFINE III SHORT PEN 31G X 8 MM MISC USE AS DIRECTED EVERY MORNING, NOON, EVENING, AND AT BEDTIME 07/10/22   Shamleffer, Konrad Dolores, MD  Continuous Blood Gluc Sensor (FREESTYLE LIBRE 3 SENSOR) MISC Place 1 sensor on the skin every 14 days. Use  to check glucose continuously 07/06/22   Ivonne Andrew, NP  Dexlansoprazole 30 MG capsule DR TAKE 1 CAPSULE (30 MG TOTAL) BY MOUTH DAILY. NEEDS OFFICE VISIT FOR ADDITIONAL REFILLS 01/29/23   Ivonne Andrew, NP  dicyclomine (BENTYL) 20 MG tablet Take 1 tablet (20 mg total) by mouth 2 (two) times daily. 02/16/23   Ivonne Andrew, NP  furosemide (LASIX) 40 MG tablet TAKE 1 TABLET(40 MG) BY MOUTH TWICE DAILY 01/19/23   Patwardhan, Manish J, MD  glucose blood (ACCU-CHEK GUIDE) test strip USE TO TEST BLOOD GLUCOSE AS DIRECTED UP TO 4 TIMES DAILY 11/14/22   Ivonne Andrew, NP  HUMALOG KWIKPEN 200 UNIT/ML KwikPen Inject 50 Units into the skin 3 (three) times daily before meals. 12/27/22   Ivonne Andrew, NP  hydrochlorothiazide (MICROZIDE) 12.5 MG capsule TAKE 1 CAPSULE(12.5 MG) BY MOUTH DAILY Patient taking differently: Take 12.5 mg by mouth daily. 12/14/21   Cantwell, Celeste C, PA-C  losartan (COZAAR) 100 MG tablet Take 1 tablet (100 mg total) by mouth daily. 01/11/23   Ivonne Andrew, NP  megestrol (MEGACE) 40 MG tablet Take two tablets three times daily for 3 days, then two tablets twice daily 10/23/22   Lorriane Shire, MD  metoCLOPramide (REGLAN) 10 MG tablet Take 1 tablet (10 mg total) by mouth every 6 (six) hours as needed for nausea. 01/17/23   Al Decant, PA-C  metoprolol succinate (TOPROL-XL) 100 MG 24 hr tablet Take 100 mg by mouth daily.  Take with or immediately following a meal.    [provider]  oxyCODONE-acetaminophen (PERCOCET) 10-325 MG tablet Take 1 tablet by mouth 5 (five) times daily as needed for pain. 02/19/23   Jones Bales, NP  Pimozide 1 MG TABS Take by mouth. 02/10/23   [provider]  potassium chloride SA (KLOR-CON M) 20 MEQ tablet Take 1 tablet (20 mEq total) by mouth daily. 01/08/23   Ivonne Andrew, NP  rosuvastatin (CRESTOR) 20 MG tablet TAKE 1 TABLET(20 MG) BY MOUTH DAILY 01/19/23   Patwardhan, Anabel Bene, MD  SPIRIVA RESPIMAT 2.5 MCG/ACT AERS  INHALE 2 PUFFS INTO THE LUNGS DAILY 02/03/22   Icard, Rachel Bo, DO  spironolactone (ALDACTONE) 50 MG tablet TAKE 1 TABLET(50 MG) BY MOUTH DAILY 12/22/20   Patwardhan, Anabel Bene, MD  SYMBICORT 160-4.5 MCG/ACT inhaler INHALE 2 PUFFS INTO THE LUNGS TWICE DAILY 10/31/22   Icard, Rachel Bo, DO  tiZANidine (ZANAFLEX) 4 MG tablet Take 1 tablet (4 mg total) by mouth every 6 (six) hours as needed for muscle spasms. 02/12/23   Raulkar, Drema Pry, MD  TOUJEO MAX SOLOSTAR 300 UNIT/ML Solostar Pen Inject into the skin. 02/12/23   [provider]      Allergies    Elavil [amitriptyline], Orudis [ketoprofen], Desyrel [trazodone], Aspirin, Motrin [ibuprofen], Naprosyn [naproxen], Neurontin [gabapentin], Sulfa antibiotics, Ultram [tramadol], Victoza [liraglutide], and Zegerid [omeprazole-sodium bicarbonate]    Review of Systems   Review of Systems  Gastrointestinal:  Positive for abdominal pain, diarrhea and vomiting.  Musculoskeletal:  Positive for back pain.  All other systems reviewed and are negative.   Physical Exam Updated Vital Signs BP (!) 178/85   Pulse 96   Temp 97.9 F (36.6 C) (Oral)   Resp (!) 25   SpO2 100%  Physical Exam Vitals and nursing note reviewed.  Constitutional:      General: She is in acute distress.     Appearance: She is well-developed. She is ill-appearing.  HENT:     Head: Normocephalic and atraumatic.     Mouth/Throat:     Pharynx: No oropharyngeal exudate.  Eyes:     General: No scleral icterus.       Right eye: No discharge.        Left eye: No discharge.     Conjunctiva/sclera: Conjunctivae normal.     Pupils: Pupils are equal, round, and reactive to light.  Neck:     Thyroid: No thyromegaly.     Vascular: No JVD.  Cardiovascular:     Rate and Rhythm: Regular rhythm. Tachycardia present.     Heart sounds: Normal heart sounds. No murmur heard.    No friction rub. No gallop.  Pulmonary:     Effort: Pulmonary effort is normal. No respiratory distress.      Breath sounds: Normal breath sounds. No wheezing or rales.  Abdominal:     General: Bowel sounds are normal. There is no distension.     Palpations: Abdomen is soft. There is no mass.     Tenderness: There is abdominal tenderness.     Comments: Diffuse abdominal tenderness with diffuse guarding  Musculoskeletal:        General: No tenderness. Normal range of motion.     Cervical back: Normal range of motion and neck supple.     Right lower leg: No edema.     Left lower leg: No edema.  Lymphadenopathy:     Cervical: No cervical adenopathy.  Skin:    General: Skin  is warm and dry.     Findings: No erythema or rash.  Neurological:     Mental Status: She is alert.     Coordination: Coordination normal.  Psychiatric:        Behavior: Behavior normal.     ED Results / Procedures / Treatments   Labs (all labs ordered are listed, but only abnormal results are displayed) Labs Reviewed  COMPREHENSIVE METABOLIC PANEL - Abnormal; Notable for the following components:      Result Value   Sodium 134 (*)    Potassium 3.4 (*)    Chloride 96 (*)    CO2 19 (*)    Glucose, Bld 282 (*)    BUN 22 (*)    Creatinine, Ser 2.94 (*)    Calcium 10.7 (*)    GFR, Estimated 19 (*)    Anion gap 19 (*)    All other components within normal limits  CBC - Abnormal; Notable for the following components:   WBC 13.4 (*)    RBC 6.06 (*)    Hemoglobin 16.2 (*)    HCT 46.8 (*)    MCV 77.2 (*)    RDW 16.2 (*)    All other components within normal limits  URINALYSIS, ROUTINE W REFLEX MICROSCOPIC - Abnormal; Notable for the following components:   APPearance TURBID (*)    Specific Gravity, Urine 1.036 (*)    Hgb urine dipstick LARGE (*)    Protein, ur 100 (*)    Leukocytes,Ua LARGE (*)    Bacteria, UA MANY (*)    All other components within normal limits  LACTIC ACID, PLASMA - Abnormal; Notable for the following components:   Lactic Acid, Venous 5.5 (*)    All other components within normal  limits  I-STAT CHEM 8, ED - Abnormal; Notable for the following components:   Potassium 3.1 (*)    BUN 25 (*)    Creatinine, Ser 3.00 (*)    Glucose, Bld 187 (*)    Calcium, Ion 1.14 (*)    TCO2 18 (*)    Hemoglobin 17.7 (*)    HCT 52.0 (*)    All other components within normal limits  I-STAT VENOUS BLOOD GAS, ED - Abnormal; Notable for the following components:   pH, Ven 7.643 (*)    pCO2, Ven 15.3 (*)    pO2, Ven 59 (*)    Bicarbonate 16.6 (*)    TCO2 17 (*)    Potassium 3.1 (*)    Calcium, Ion 1.12 (*)    HCT 51.0 (*)    Hemoglobin 17.3 (*)    All other components within normal limits  CBG MONITORING, ED - Abnormal; Notable for the following components:   Glucose-Capillary 201 (*)    All other components within normal limits  TROPONIN I (HIGH SENSITIVITY) - Abnormal; Notable for the following components:   Troponin I (High Sensitivity) 18 (*)    All other components within normal limits  CULTURE, BLOOD (ROUTINE X 2)  CULTURE, BLOOD (ROUTINE X 2)  LIPASE, BLOOD  LACTIC ACID, PLASMA  I-STAT BETA HCG BLOOD, ED ([facility], [facility], [facility] ONLY)  TROPONIN I (HIGH SENSITIVITY)    EKG None  Radiology DG Chest 2 View  Result Date: 03/18/2023 CLINICAL DATA:  Chest pain EXAM: CHEST - 2 VIEW COMPARISON:  December 18, 2022 FINDINGS: Stable cardiomegaly. The hila and mediastinum are normal. No pneumothorax. No nodules or masses. No focal infiltrates. IMPRESSION: No active cardiopulmonary disease. Electronically Signed   By: Onalee Hua  Mayford Knife III M.D.   On: 03/18/2023 20:03   CT ABDOMEN PELVIS W CONTRAST  Result Date: 03/18/2023 CLINICAL DATA:  Pain right lower quadrant of abdomen EXAM: CT ABDOMEN AND PELVIS WITH CONTRAST TECHNIQUE: Multidetector CT imaging of the abdomen and pelvis was performed using the standard protocol following bolus administration of intravenous contrast. RADIATION DOSE REDUCTION: This exam was performed according to the departmental dose-optimization program which includes  automated exposure control, adjustment of the mA and/or kV according to patient size and/or use of iterative reconstruction technique. CONTRAST:  75mL OMNIPAQUE IOHEXOL 350 MG/ML SOLN COMPARISON:  02/18/2023 FINDINGS: Lower chest: Minimal pericardial effusion is seen. Small linear densities in the lower lung fields may suggest minimal scarring. Hepatobiliary: No focal abnormalities are seen in the liver. There is no dilation of bile ducts. Gallbladder is unremarkable. Pancreas: Pancreas is smaller than usual incised. No focal abnormalities are seen. Spleen: Unremarkable. Adrenals/Urinary Tract: Adrenals are unremarkable. There is no hydronephrosis. There are no renal or ureteral stones. Urinary bladder is unremarkable. Stomach/Bowel: Small hiatal hernia is seen. Stomach is unremarkable. Small bowel loops are not dilated. Appendix is of normal caliber. There is no significant wall thickening in colon. There is no pericolic stranding. Vascular/Lymphatic: Vascular structures are unremarkable. There are subcentimeter nodes in mesentery and adjacent to put a bodies, possibly suggesting reactive hyperplasia of nodes. Reproductive: Uterus is enlarged in size. No dominant adnexal masses are seen. There is possible small calcification in the right adnexa. Other: There is no ascites or pneumoperitoneum. Small umbilical hernia containing fat is seen. Musculoskeletal: No acute findings are seen. IMPRESSION: There is no evidence of intestinal obstruction or pneumoperitoneum. There is no hydronephrosis. Appendix is not dilated. Minimal pericardial effusion.  Small hiatal hernia. Electronically Signed   By: Ernie Avena M.D.   On: 03/18/2023 18:59    Procedures .Critical Care  Performed by: Eber Hong, MD Authorized by: Eber Hong, MD   Critical care provider statement:    Critical care time (minutes):  45   Critical care time was exclusive of:  Separately billable procedures and treating other patients    Critical care was necessary to treat or prevent imminent or life-threatening deterioration of the following conditions:  Renal failure   Critical care was time spent personally by me on the following activities:  Development of treatment plan with patient or surrogate, discussions with consultants, evaluation of patient's response to treatment, examination of patient, obtaining history from patient or surrogate, review of old charts, re-evaluation of patient's condition, pulse oximetry, ordering and review of radiographic studies, ordering and review of laboratory studies and ordering and performing treatments and interventions   I assumed direction of critical care for this patient from another provider in my specialty: no     Care discussed with: admitting provider   Comments:           Medications Ordered in ED Medications  sodium chloride 0.9 % bolus 1,000 mL (has no administration in time range)  cefTRIAXone (ROCEPHIN) 2 g in sodium chloride 0.9 % 100 mL IVPB (has no administration in time range)  ondansetron (ZOFRAN-ODT) disintegrating tablet 4 mg (4 mg Oral Given 03/18/23 1754)  sodium chloride 0.9 % bolus 1,000 mL (1,000 mLs Intravenous New Bag/Given 03/18/23 1810)  HYDROmorphone (DILAUDID) injection 1 mg (1 mg Intravenous Given 03/18/23 1810)  iohexol (OMNIPAQUE) 350 MG/ML injection 75 mL (75 mLs Intravenous Contrast Given 03/18/23 1834)  sodium chloride 0.9 % bolus 1,000 mL (1,000 mLs Intravenous New Bag/Given 03/18/23 1858)  ondansetron [facility]) injection 4 mg (4 mg Intravenous Given 03/18/23 1902)    ED Course/ Medical Decision Making/ A&P                             Medical Decision Making Amount and/or Complexity of Data Reviewed Labs: ordered. Radiology: ordered.  Risk Prescription drug management. Decision regarding hospitalization.    This patient presents to the ED for concern of abdominal pain tachycardia and vomiting, this involves an extensive number of treatment  options, and is a complaint that carries with it a high risk of complications and morbidity.  The differential diagnosis includes DKA, intra-abdominal abnormalities or pathology including appendicitis, hemorrhage etc.   Co morbidities that complicate the patient evaluation  Obesity, diabetes   Additional history obtained:  Additional history obtained from medical record External records from outside source obtained and reviewed including ultrasound performed last week, the patient had small amount of fluid in the endometrial canal, possible fibroid uterus, ovaries not greatly visualized, no other significant findings   Lab Tests:  I Ordered, and personally interpreted labs.  The pertinent results include: Lactate is elevated over 5, creatinine is 3.0, hemoconcentrated with a hemoglobin of 17.7, white blood cell count of 13,400   Imaging Studies ordered:  I ordered imaging studies including CT scan of the abdomen and pelvis I independently visualized and interpreted imaging which showed no acute intra-abdominal findings of pathology I agree with the radiologist interpretation   Cardiac Monitoring: / EKG:  The patient was maintained on a cardiac monitor.  I personally viewed and interpreted the cardiac monitored which showed an underlying rhythm of: Sinus tachycardia   Consultations Obtained:  I requested consultation with the hospitalist Dr. Antionette Char,  and discussed lab and imaging findings as well as pertinent plan - they recommend: Admission to the hospital   Problem List / ED Course / Critical interventions / Medication management  The patient is tachycardic with a leukocytosis with renal failure, there is no obvious source of this other than dehydration, it is unclear whether she is truly septic but antibiotics were given and IV fluids were used to resuscitate.  She is not hypotensive and I do not have a source of infection, I think this is more likely related to dehydration and  hyperventilation as her ABG did confirm a respiratory alkalosis.  I do not think that she is in septic shock despite a lactate that is elevated. I ordered medication including IV fluids and antibiotics and antiemetics for dehydration and hypotension Reevaluation of the patient after these medicines showed that the patient improved I have reviewed the patients home medicines and have made adjustments as needed   Social Determinants of Health:  Chronic pain   Test / Admission - Considered:  Will admit to higher level of care         Final Clinical Impression(s) / ED Diagnoses Final diagnoses:  Acute renal failure, unspecified acute renal failure type [facility])  Dehydration    Rx / DC Orders ED Discharge Orders     None         Eber Hong, MD 03/18/23 2012

## 2023-03-18 NOTE — H&P (Signed)
History and Physical    Jill Shaw ZOX:[id number] DOB: Jul 21, 1976 DOA: 03/18/2023  PCP: Ivonne Andrew, NP   Patient coming from: Home   Chief Complaint: Abdominal pain, back pain, N/V/D   HPI: Jill Shaw is a 47 y.o. female with medical history significant for hypertension, insulin-dependent diabetes mellitus, COPD, HFpEF, anxiety, abnormal uterine bleeding, and BMI 46 who presents to the emergency department with severe lower abdominal pain, low back pain, nausea, vomiting, and diarrhea.  Patient reports that her lower abdominal pain began 1 week ago, worsened significantly after a transvaginal ultrasound approximately 1 week ago, and has continued to worsen.  She reports radiation through to her low back as well as nausea, nonbloody vomiting, and diarrhea.  She has been unable to tolerate any solid food but has been able to keep some liquids down.  ED Course: Upon arrival to the ED, patient is found to be afebrile, saturating well on room air, tachypneic and tachycardic, and hypertensive.  EKG demonstrates sinus tachycardia with prolonged QT interval.  Chest x-ray is negative for acute cardiopulmonary disease.  No acute findings noted on CT of the abdomen and pelvis.  Labs are most notable for creatinine 2.94, WBC 13,400, hemoglobin 16.2, and lactic acid 5.5.  Bacteriuria and pyuria noted on UA.  Blood cultures were ordered from the ED, 3 L of LR were administered, and the patient was also treated with Rocephin, Dilaudid, and Zofran.  Review of Systems:  All other systems reviewed and apart from HPI, are negative.  Past Medical History:  Diagnosis Date   Anemia    Anxiety    Arthritis    knees, hands   Asthma    Chronic diastolic (congestive) heart failure (HCC)    COPD (chronic obstructive pulmonary disease) (HCC)    Diabetes mellitus (HCC)    DKA (diabetic ketoacidosis) (HCC)    Dysfunctional uterine bleeding    Gastritis    GERD (gastroesophageal reflux disease)     Hypertension    Intractable nausea and vomiting 03/23/2021   Neuromuscular disorder (HCC)    neuropathy feet   Seizures (HCC) 09/12/2017   pt states r/t stress and blood sugar - no meds last one 4 months ago, not seen neurologist   Sickle cell trait (HCC)    Smoker    Vitamin D deficiency 10/2019   Wears glasses     Past Surgical History:  Procedure Laterality Date   CESAREAN SECTION     x 1. for twins   DILATION AND CURETTAGE OF UTERUS N/A 08/20/2019   Procedure: DILATATION AND CURETTAGE;  Surgeon: Allie Bossier, MD;  Location: [facility] OR;  Service: Gynecology;  Laterality: N/A;   ENDOMETRIAL ABLATION N/A 08/20/2019   Procedure: Minerva Ablation;  Surgeon: Allie Bossier, MD;  Location: [facility] OR;  Service: Gynecology;  Laterality: N/A;   EYE SURGERY Bilateral    laser right and cataract removed left eye   LEFT HEART CATH AND CORONARY ANGIOGRAPHY N/A 08/30/2021   Procedure: LEFT HEART CATH AND CORONARY ANGIOGRAPHY;  Surgeon: Elder Negus, MD;  Location: [facility];  Service: Cardiovascular;  Laterality: N/A;   RADIOLOGY WITH ANESTHESIA N/A 09/16/2019   Procedure: MRI WITH ANESTHESIA   L SPINE WITHOUT CONTRAST, T SPINE WITHOUT CONTRAST , CERVICAL WITHOUT CONTRAST;  Surgeon: Radiologist, Medication, MD;  Location: [facility] OR;  Service: Radiology;  Laterality: N/A;   TUBAL LIGATION     interval BTL   UPPER GI ENDOSCOPY  07/2017  Social History:   reports that she has been smoking cigarettes. She has a 6.50 pack-year smoking history. She has never used smokeless tobacco. She reports that she does not drink alcohol and does not use drugs.  Allergies  Allergen Reactions   Elavil [Amitriptyline] Other (See Comments)    Coma   Orudis [Ketoprofen] Nausea And Vomiting   Desyrel [Trazodone] Nausea And Vomiting   Aspirin Nausea Only   Motrin [Ibuprofen] Nausea And Vomiting   Naprosyn [Naproxen] Nausea And Vomiting   Neurontin [Gabapentin] Nausea And Vomiting and Other (See Comments)     upset stomach   Sulfa Antibiotics Nausea And Vomiting   Ultram [Tramadol] Nausea And Vomiting and Other (See Comments)    stomach upset   Victoza [Liraglutide] Nausea And Vomiting   Zegerid [Omeprazole-Sodium Bicarbonate] Nausea And Vomiting    Family History  Problem Relation Age of Onset   Diabetes Mother    Hypertension Mother    Migraines Paternal Grandfather    Colon cancer Neg Hx    Esophageal cancer Neg Hx    Stomach cancer Neg Hx    Rectal cancer Neg Hx      Prior to Admission medications   Medication Sig Start Date End Date Taking? Authorizing Provider  albuterol (PROVENTIL) (2.5 MG/3ML) 0.083% nebulizer solution Inhale into the lungs. 09/27/20  Yes [provider]  albuterol (VENTOLIN HFA) 108 (90 Base) MCG/ACT inhaler Inhale 2 puffs into the lungs every 6 (six) hours as needed for wheezing or shortness of breath. 07/18/21  Yes Luciano Cutter, MD  Dexlansoprazole 30 MG capsule DR TAKE 1 CAPSULE (30 MG TOTAL) BY MOUTH DAILY. NEEDS OFFICE VISIT FOR ADDITIONAL REFILLS 01/29/23  Yes Ivonne Andrew, NP  dicyclomine (BENTYL) 20 MG tablet Take 1 tablet (20 mg total) by mouth 2 (two) times daily. 02/16/23  Yes Ivonne Andrew, NP  furosemide (LASIX) 40 MG tablet TAKE 1 TABLET(40 MG) BY MOUTH TWICE DAILY Patient taking differently: Take 40 mg by mouth 2 (two) times daily. 01/19/23  Yes Patwardhan, Manish J, MD  HUMALOG KWIKPEN 200 UNIT/ML KwikPen Inject 50 Units into the skin 3 (three) times daily before meals. 12/27/22  Yes Ivonne Andrew, NP  hydrochlorothiazide (MICROZIDE) 12.5 MG capsule TAKE 1 CAPSULE(12.5 MG) BY MOUTH DAILY Patient taking differently: Take 12.5 mg by mouth daily. 12/14/21  Yes Cantwell, Celeste C, PA-C  losartan (COZAAR) 100 MG tablet Take 1 tablet (100 mg total) by mouth daily. 01/11/23  Yes Ivonne Andrew, NP  metoCLOPramide (REGLAN) 10 MG tablet Take 1 tablet (10 mg total) by mouth every 6 (six) hours as needed for nausea. 01/17/23  Yes Al Decant, PA-C  metoprolol succinate (TOPROL-XL) 100 MG 24 hr tablet Take 100 mg by mouth daily. Take with or immediately following a meal.   Yes [provider]  oxyCODONE-acetaminophen (PERCOCET) 10-325 MG tablet Take 1 tablet by mouth 5 (five) times daily as needed for pain. 02/19/23  Yes Jones Bales, NP  potassium chloride SA (KLOR-CON M) 20 MEQ tablet Take 1 tablet (20 mEq total) by mouth daily. 01/08/23  Yes Ivonne Andrew, NP  rosuvastatin (CRESTOR) 20 MG tablet TAKE 1 TABLET(20 MG) BY MOUTH DAILY Patient taking differently: Take 20 mg by mouth daily. 01/19/23  Yes Patwardhan, Anabel Bene, MD  SPIRIVA RESPIMAT 2.5 MCG/ACT AERS INHALE 2 PUFFS INTO THE LUNGS DAILY 02/03/22  Yes Icard, Bradley L, DO  spironolactone (ALDACTONE) 50 MG tablet TAKE 1 TABLET(50 MG) BY MOUTH DAILY  Patient taking differently: Take 50 mg by mouth once. 12/22/20  Yes Patwardhan, Anabel Bene, MD  SYMBICORT 160-4.5 MCG/ACT inhaler INHALE 2 PUFFS INTO THE LUNGS TWICE DAILY 10/31/22  Yes Icard, Bradley L, DO  tiZANidine (ZANAFLEX) 4 MG tablet Take 1 tablet (4 mg total) by mouth every 6 (six) hours as needed for muscle spasms. 02/12/23  Yes Raulkar, Drema Pry, MD  TOUJEO MAX SOLOSTAR 300 UNIT/ML Solostar Pen Inject 60 Units into the skin in the morning. 02/12/23  Yes [provider]  Accu-Chek Softclix Lancets lancets USE TO TEST AS DIRECTED UP TO FOUR TIMES DAILY. 03/06/22   Shamleffer, Konrad Dolores, MD  B-D ULTRAFINE III SHORT PEN 31G X 8 MM MISC USE AS DIRECTED EVERY MORNING, NOON, EVENING, AND AT BEDTIME 07/10/22   Shamleffer, Konrad Dolores, MD  Continuous Blood Gluc Sensor (FREESTYLE LIBRE 3 SENSOR) MISC Place 1 sensor on the skin every 14 days. Use to check glucose continuously 07/06/22   Ivonne Andrew, NP  glucose blood (ACCU-CHEK GUIDE) test strip USE TO TEST BLOOD GLUCOSE AS DIRECTED UP TO 4 TIMES DAILY 11/14/22   Ivonne Andrew, NP  megestrol (MEGACE) 40 MG tablet Take two tablets three times daily  for 3 days, then two tablets twice daily Patient not taking: Reported on 03/18/2023 10/23/22   Lorriane Shire, MD  Pimozide 1 MG TABS Take by mouth. 02/10/23   [provider]    Physical Exam: Vitals:   03/18/23 1815 03/18/23 1818 03/18/23 1845 03/18/23 1900  BP: 121/79  (!) 166/98 (!) 178/85  Pulse: (!) 126 (!) 115 (!) 103 96  Resp: (!) 41 (!) 26 (!) 21 (!) 25  Temp:      TempSrc:      SpO2: 100% 100% 100% 100%     Constitutional: NAD, diaphoretic   Eyes: PERTLA, lids and conjunctivae normal ENMT: Mucous membranes are moist. Posterior pharynx clear of any exudate or lesions.   Neck: supple, no masses  Respiratory:  no wheezing, no crackles. No accessory muscle use.  Cardiovascular: Rate ~100 and regular. No extremity edema.  Abdomen: Soft, suprapubic tenderness, no rebound pain or guarding. Bowel sounds active.  Musculoskeletal: no clubbing / cyanosis. No joint deformity upper and lower extremities.   Skin: no significant rashes, lesions, ulcers. Warm, well-perfused. Neurologic: CN 2-12 grossly intact. Moving all extremities. Alert and oriented.  Psychiatric: Calm. Cooperative.    Labs and Imaging on Admission: I have personally reviewed following labs and imaging studies  CBC: Recent Labs  Lab 03/18/23 1741 03/18/23 1835  WBC 13.4*  --   HGB 16.2* 17.7*  17.3*  HCT 46.8* 52.0*  51.0*  MCV 77.2*  --   PLT 247  --    Basic Metabolic Panel: Recent Labs  Lab 03/18/23 1741 03/18/23 1835  NA 134* 137  135  K 3.4* 3.1*  3.1*  CL 96* 100  CO2 19*  --   GLUCOSE 282* 187*  BUN 22* 25*  CREATININE 2.94* 3.00*  CALCIUM 10.7*  --    GFR: CrCl cannot be calculated (Unknown ideal weight.). Liver Function Tests: Recent Labs  Lab 03/18/23 1741  AST 25  ALT 11  ALKPHOS 86  BILITOT 1.0  PROT 7.8  ALBUMIN 4.4   Recent Labs  Lab 03/18/23 1741  LIPASE 21   No results for input(s): "AMMONIA" in the last 168 hours. Coagulation Profile: No results  for input(s): "INR", "PROTIME" in the last 168 hours. Cardiac Enzymes: No results for input(s): "CKTOTAL", "  CKMB", "CKMBINDEX", "TROPONINI" in the last 168 hours. BNP (last 3 results) No results for input(s): "PROBNP" in the last 8760 hours. HbA1C: No results for input(s): "HGBA1C" in the last 72 hours. CBG: Recent Labs  Lab 03/18/23 1810 03/18/23 2018  GLUCAP 201* 57*   Lipid Profile: No results for input(s): "CHOL", "HDL", "LDLCALC", "TRIG", "CHOLHDL", "LDLDIRECT" in the last 72 hours. Thyroid Function Tests: No results for input(s): "TSH", "T4TOTAL", "FREET4", "T3FREE", "THYROIDAB" in the last 72 hours. Anemia Panel: No results for input(s): "VITAMINB12", "FOLATE", "FERRITIN", "TIBC", "IRON", "RETICCTPCT" in the last 72 hours. Urine analysis:    Component Value Date/Time   COLORURINE YELLOW 03/18/2023 1733   APPEARANCEUR TURBID (A) 03/18/2023 1733   LABSPEC 1.036 (H) 03/18/2023 1733   PHURINE 5.0 03/18/2023 1733   GLUCOSEU NEGATIVE 03/18/2023 1733   HGBUR LARGE (A) 03/18/2023 1733   BILIRUBINUR NEGATIVE 03/18/2023 1733   BILIRUBINUR negative 12/29/2021 1205   BILIRUBINUR NEGATIVE 11/05/2019 1342   KETONESUR NEGATIVE 03/18/2023 1733   PROTEINUR 100 (A) 03/18/2023 1733   UROBILINOGEN 1.0 12/29/2021 1205   UROBILINOGEN 0.2 11/12/2020 1148   NITRITE NEGATIVE 03/18/2023 1733   LEUKOCYTESUR LARGE (A) 03/18/2023 1733   Sepsis Labs: @LABRCNTIP (procalcitonin:4,lacticidven:4) )No results found for this or any previous visit (from the past 240 hour(s)).   Radiological Exams on Admission: DG Chest 2 View  Result Date: 03/18/2023 CLINICAL DATA:  Chest pain EXAM: CHEST - 2 VIEW COMPARISON:  December 18, 2022 FINDINGS: Stable cardiomegaly. The hila and mediastinum are normal. No pneumothorax. No nodules or masses. No focal infiltrates. IMPRESSION: No active cardiopulmonary disease. Electronically Signed   By: Gerome Sam III M.D.   On: 03/18/2023 20:03   CT ABDOMEN PELVIS W  CONTRAST  Result Date: 03/18/2023 CLINICAL DATA:  Pain right lower quadrant of abdomen EXAM: CT ABDOMEN AND PELVIS WITH CONTRAST TECHNIQUE: Multidetector CT imaging of the abdomen and pelvis was performed using the standard protocol following bolus administration of intravenous contrast. RADIATION DOSE REDUCTION: This exam was performed according to the departmental dose-optimization program which includes automated exposure control, adjustment of the mA and/or kV according to patient size and/or use of iterative reconstruction technique. CONTRAST:  75mL OMNIPAQUE IOHEXOL 350 MG/ML SOLN COMPARISON:  02/18/2023 FINDINGS: Lower chest: Minimal pericardial effusion is seen. Small linear densities in the lower lung fields may suggest minimal scarring. Hepatobiliary: No focal abnormalities are seen in the liver. There is no dilation of bile ducts. Gallbladder is unremarkable. Pancreas: Pancreas is smaller than usual incised. No focal abnormalities are seen. Spleen: Unremarkable. Adrenals/Urinary Tract: Adrenals are unremarkable. There is no hydronephrosis. There are no renal or ureteral stones. Urinary bladder is unremarkable. Stomach/Bowel: Small hiatal hernia is seen. Stomach is unremarkable. Small bowel loops are not dilated. Appendix is of normal caliber. There is no significant wall thickening in colon. There is no pericolic stranding. Vascular/Lymphatic: Vascular structures are unremarkable. There are subcentimeter nodes in mesentery and adjacent to put a bodies, possibly suggesting reactive hyperplasia of nodes. Reproductive: Uterus is enlarged in size. No dominant adnexal masses are seen. There is possible small calcification in the right adnexa. Other: There is no ascites or pneumoperitoneum. Small umbilical hernia containing fat is seen. Musculoskeletal: No acute findings are seen. IMPRESSION: There is no evidence of intestinal obstruction or pneumoperitoneum. There is no hydronephrosis. Appendix is not  dilated. Minimal pericardial effusion.  Small hiatal hernia. Electronically Signed   By: Ernie Avena M.D.   On: 03/18/2023 18:59    EKG: Independently reviewed. Sinus tachycardia,  rate 135, QTc 561.   Assessment/Plan   1. AKI superimposed on CKD 3A  - SCr is 2.94 on admission, up from apparent baseline of ~1.2  - No obstructive uropathy on CT in ED  - Likely prerenal in setting of N/V/D, as well as continued ARB and diuretic use  - She was fluid-resuscitated with 3 liters NS in ED  - Hold diuretics and ARB, continue IVF hydration, renally-dose medications, repeat chem panel in am    2. UTI  - Continue Rocephin, follow cultures and clinical course    3. N/V/D  - No acute findings on CT and exam is benign  - Continue IVF hydration, monitor electrolytes, she is currently tolerating clears, will advance as tolerated   4. Lactic acidosis  - Initial lactate is 5.5  - Continue IVF hydration and antibiotic, trend lactate    5. Hypertensive urgency  - SBP as high as 200 in ED in setting of pain  - Continue pain-control, hold diuretics and ARB in light of AKI, continue metoprolol and use IV labetalol as needed    6. Chronic HFpEF  - She is hypovolemic on admission  - Hold diuretics, monitor weight and I/Os  7. Insulin-dependent DM  - Check CBGs and continue insulin with dose-reduction in light of reduced GFR and inability to tolerate food currently    8. Prolonged QT interval  - QTc is 561 on admission  - Supplement potassium, check magnesium level, avoid QT-prolonging medications     DVT prophylaxis: sq heparin  Code Status: Full  Level of Care: Level of care: Progressive Family Communication: none present  Disposition Plan:  Patient is from: home  Anticipated d/c is to: home Anticipated d/c date is: 03/21/23  Patient currently: Pending improved/stable renal function, tolerance of adequate oral intake  Consults called: None  Admission status: Inpatient     Briscoe Deutscher, MD [facility]  03/18/2023, 8:43 PM

## 2023-03-18 NOTE — ED Notes (Signed)
Pt given ginger ale. MD Opyd made aware of second low CBG of 50.

## 2023-03-19 ENCOUNTER — Telehealth: Payer: Self-pay | Admitting: Registered Nurse

## 2023-03-19 ENCOUNTER — Other Ambulatory Visit: Payer: Self-pay

## 2023-03-19 ENCOUNTER — Other Ambulatory Visit: Payer: Self-pay | Admitting: Nurse Practitioner

## 2023-03-19 DIAGNOSIS — Z794 Long term (current) use of insulin: Secondary | ICD-10-CM

## 2023-03-19 DIAGNOSIS — N1831 Chronic kidney disease, stage 3a: Secondary | ICD-10-CM | POA: Diagnosis not present

## 2023-03-19 DIAGNOSIS — N179 Acute kidney failure, unspecified: Secondary | ICD-10-CM | POA: Diagnosis not present

## 2023-03-19 LAB — GLUCOSE, CAPILLARY
Glucose-Capillary: 95 mg/dL (ref 70–99)
Glucose-Capillary: 177 mg/dL — ABNORMAL HIGH (ref 70–99)
Glucose-Capillary: 248 mg/dL — ABNORMAL HIGH (ref 70–99)
Glucose-Capillary: 407 mg/dL — ABNORMAL HIGH (ref 70–99)
Glucose-Capillary: 287 mg/dL — ABNORMAL HIGH (ref 70–99)

## 2023-03-19 LAB — BASIC METABOLIC PANEL
Anion gap: 11 (ref 5–15)
BUN: 18 mg/dL (ref 6–20)
CO2: 22 mmol/L (ref 22–32)
Calcium: 8.8 mg/dL — ABNORMAL LOW (ref 8.9–10.3)
Chloride: 102 mmol/L (ref 98–111)
Creatinine, Ser: 1.99 mg/dL — ABNORMAL HIGH (ref 0.44–1.00)
GFR, Estimated: 31 mL/min — ABNORMAL LOW (ref >60)
Glucose, Bld: 88 mg/dL (ref 70–99)
Potassium: 3.3 mmol/L — ABNORMAL LOW (ref 3.5–5.1)
Sodium: 135 mmol/L (ref 135–145)

## 2023-03-19 LAB — CBC
HCT: 39.5 % (ref 36.0–46.0)
Hemoglobin: 13.5 g/dL (ref 12.0–15.0)
MCH: 27.3 pg (ref 26.0–34.0)
MCHC: 34.2 g/dL (ref 30.0–36.0)
MCV: 80 fL (ref 80.0–100.0)
Platelets: 179 10*3/uL (ref 150–400)
RBC: 4.94 MIL/uL (ref 3.87–5.11)
RDW: 16.4 % — ABNORMAL HIGH (ref 11.5–15.5)
WBC: 11.1 10*3/uL — ABNORMAL HIGH (ref 4.0–10.5)
nRBC: 0 % (ref 0.0–0.2)

## 2023-03-19 LAB — CULTURE, BLOOD (ROUTINE X 2)

## 2023-03-19 LAB — MAGNESIUM: Magnesium: 2.3 mg/dL (ref 1.7–2.4)

## 2023-03-19 LAB — LACTIC ACID, PLASMA: Lactic Acid, Venous: 1 mmol/L (ref 0.5–1.9)

## 2023-03-19 MED ORDER — HUMALOG KWIKPEN 200 UNIT/ML ~~LOC~~ SOPN
50.0000 [IU] | PEN_INJECTOR | Freq: Three times a day (TID) | SUBCUTANEOUS | 0 refills | Status: DC
Start: 2023-03-19 — End: 2023-06-05
  Filled 2023-03-19: qty 60, 80d supply, fill #0

## 2023-03-19 MED ORDER — POTASSIUM CHLORIDE CRYS ER 20 MEQ PO TBCR
40.0000 meq | EXTENDED_RELEASE_TABLET | Freq: Once | ORAL | Status: AC
  Administered 2023-03-19: 40 meq via ORAL
  Filled 2023-03-19: qty 2

## 2023-03-19 MED ORDER — INSULIN ASPART 100 UNIT/ML IJ SOLN
5.0000 [IU] | Freq: Three times a day (TID) | INTRAMUSCULAR | Status: DC
  Administered 2023-03-19: 5 [IU] via SUBCUTANEOUS

## 2023-03-19 MED ORDER — DEXTROSE IN LACTATED RINGERS 5 % IV SOLN: INTRAVENOUS | Status: DC

## 2023-03-19 MED ORDER — POTASSIUM CHLORIDE CRYS ER 20 MEQ PO TBCR
20.0000 meq | EXTENDED_RELEASE_TABLET | Freq: Every day | ORAL | 0 refills | Status: DC
  Filled 2023-03-19: qty 30, 30d supply, fill #0

## 2023-03-19 MED ORDER — INSULIN GLARGINE-YFGN 100 UNIT/ML ~~LOC~~ SOLN
5.0000 [IU] | Freq: Every day | SUBCUTANEOUS | Status: DC
  Filled 2023-03-19: qty 0.05

## 2023-03-19 MED ORDER — NICOTINE 14 MG/24HR TD PT24
14.0000 mg | MEDICATED_PATCH | Freq: Every day | TRANSDERMAL | Status: DC
  Administered 2023-03-19 – 2023-03-21 (×3): 14 mg via TRANSDERMAL
  Filled 2023-03-19 (×3): qty 1

## 2023-03-19 MED ORDER — INSULIN GLARGINE-YFGN 100 UNIT/ML ~~LOC~~ SOLN
15.0000 [IU] | Freq: Every day | SUBCUTANEOUS | Status: DC
  Administered 2023-03-19: 15 [IU] via SUBCUTANEOUS
  Filled 2023-03-19 (×2): qty 0.15

## 2023-03-19 MED ORDER — SODIUM CHLORIDE 0.9 % IV SOLN: INTRAVENOUS | Status: DC

## 2023-03-19 NOTE — Telephone Encounter (Signed)
Patient was admitted to the hospital and canceled appt for tomorrow with our office, she is requesting medication refill

## 2023-03-19 NOTE — Progress Notes (Signed)
PROGRESS NOTE Jill Shaw  ZOX:[id number] DOB: 07-08-1976 DOA: 03/18/2023 PCP: Ivonne Andrew, NP  Brief Narrative/Hospital Course: 47 y.o.f w/ hypertension,IDDM,COPD, HFpEF, anxiety, abnormal uterine bleeding, and BMI 46 who presents to the emergency department with severe lower abdominal pain, low back pain, nausea, vomiting, and diarrhea, symptoms started with abdominal pain a week ago and worsening significantly after transplant ultrasound approximately a week PTA In the WJ:[id number], saturating well on room air, tachypneic and tachycardic, and hypertensive. Labs are most notable for creatinine 2.94, WBC 13,400, hemoglobin 16.2, and lactic acid 5.5.  Bacteriuria and pyuria noted on UA.  Repeat lactic acid normalized. EKG> ST, prolonged QT interval. Cxr>Neg for acute cardiopulmonary disease. CT of the abdomen and pelvis> no acute finding. Blood cultures were ordered from the ED, 3 L of LR were administered, and the patient was also treated with Rocephin, Dilaudid, and Zofran and admitted for further management of acute kidney injury UTI nausea vomiting diarrhea     Subjective: Seen examined C/o pain on suprapubic and lower abdomen but some better than yesterday Had BM yesterday [place] +, but no vomiting Overnight mild tachycardia afebrile BP stable Labs with mild hypokalemia improving creatinine 1.9 lactic acid normalized WBC downtrending  Assessment and Plan: Principal Problem:   Acute renal failure superimposed on stage 3a chronic kidney disease (HCC) Active Problems:   Essential hypertension   Chronic obstructive pulmonary disease (HCC)   Lactic acidosis   Chronic diastolic CHF (congestive heart failure) (HCC)   Acute cystitis   Type 2 diabetes mellitus without complication, with long-term current use of insulin (HCC)   Nausea vomiting and diarrhea   Hypokalemia   Prolonged QT interval   Hypertensive urgency  AKI superimposed on CKD 3a ;b/l creat ~1.2: No obstruction on  CT imaging likely prerenal in the setting of nausea vomiting diarrhea and meds including ARB/diuretics.  Creatinine improving with IV fluid hydration continue to hold nephrotoxic agent and monitor temporal arteritis Recent Labs    09/19/22 1126 12/18/22 0054 12/21/22 0702 12/21/22 2313 12/22/22 0132 01/04/23 1556 01/17/23 0920 02/18/23 0354 03/18/23 1741 03/18/23 1835 03/19/23 0303  BUN 13 6 13 15 15 11  5* 13 22* 25* 18  CREATININE 1.32* 1.19* 1.66* 1.90* 1.63* 1.15* 1.20* 1.58* 2.94* 3.00* 1.99*  CO2 23 24 25  18* 20* 24 18* 18* 19*  --  22     Severe sepsis POA due to UTI UTI: severe sepsis as evidenced by lactic acidosis leukocytosis and AKI UTI: Follow-up urine culture continue ceftriaxone.  Leukocyte is improving Recent Labs  Lab 03/18/23 1741 03/18/23 1818 03/18/23 2043 03/19/23 0303  WBC 13.4*  --   --  11.1*  LATICACIDVEN  --  5.5* 2.7* 1.0     Nausea vomiting diarrhea likely from UTI CT abdomen pelvis benign continue IV hydration antiemetics supportive care , CLD ADAT diet  Hypertensive urgency:SBP as high as 200 in ED in setting of pain .  BP currently well-controlled, continue home Toprol, holding diuretics.  Chronic HFpEF : She is hypovolemic on admission .  Continue to hold diuretics and monitor fluid status Cont to monitor daily I/O,weight, electrolytes and net balance as below.Keep on  salt/fluid restricted diet and monitor in tele. Net IO Since Admission: 3,497.62 mL [03/19/23 0929]  Filed Weights   03/19/23 0452  Weight: 120.2 kg    IDDM: With hypoglycemia blood sugar up to 50s.  Continue SSI only and monitor.  Normally premeal insulin, [place] 60 units in the morning. Needed iv d5- stop  d5w, stop semglee, monitor Recent Labs  Lab 03/18/23 2121 03/18/23 2252 03/18/23 2359 03/19/23 0339 03/19/23 0753  GLUCAP 50* 108* 108* 95 177*    Prolonged QT interval :QTc is 561 on admission.Supplement potassium, check magnesium level, avoid QT-prolonging  medication  Hypokalemia replace and recheck. Recent Labs  Lab 03/18/23 1741 03/18/23 1835 03/19/23 0303  K 3.4* 3.1*  3.1* 3.3*    Vaginal bleeding x 3 months and seeing OBGYN recently has TVS US limited and has follow up on June 20 with scope/biopsy papa smear. This is at [facility] center (Dr Trula Ore).  Morbid Obesity:Patient's Body mass index is 45.49 kg/m. : Will benefit with PCP follow-up, weight loss  healthy lifestyle and outpatient sleep evaluation.  DVT prophylaxis: heparin injection 5,000 Units Start: 03/18/23 2200 Code Status:   Code Status: Full Code Family Communication: plan of care discussed with patient at bedside. Patient status is:  INPATIENT  because of aki Level of care: Progressive   Dispo: The patient is from: Home,lives with her mom            Anticipated disposition:TBD Objective: Vitals last 24 hrs: Vitals:   03/19/23 0452 03/19/23 0713 03/19/23 0805 03/19/23 0829  BP: (!) 157/74  (!) 147/74   Pulse: 100 (!) 103 98   Resp:  19    Temp:  98.5 F (36.9 C)    TempSrc:  Oral    SpO2:    100%  Weight: 120.2 kg      Weight change:   Physical Examination: General exam: alert awake, older than stated age HEENT:Oral mucosa moist, Ear/Nose WNL grossly Respiratory system: bilaterally clear BS, no use of accessory muscle Cardiovascular system: S1 & S2 +, No JVD. Gastrointestinal system: Abdomen soft,Tender suprapubic area,ND, BS+ Nervous System:Alert, awake, moving extremities. Extremities: LE edema neg,distal peripheral pulses palpable.  Skin: No rashes,no icterus. MSK: Normal muscle bulk,tone, power  Medications reviewed:  Scheduled Meds:  heparin  5,000 Units Subcutaneous Q8H   insulin aspart  0-9 Units Subcutaneous Q4H   insulin glargine-yfgn  5 Units Subcutaneous Daily   metoprolol succinate  100 mg Oral Daily   nicotine  14 mg Transdermal Daily   pantoprazole  40 mg Oral Daily   potassium chloride  40 mEq Oral Once   rosuvastatin  20  mg Oral Daily   sodium chloride flush  3 mL Intravenous Q12H   umeclidinium bromide  1 puff Inhalation Daily   Continuous Infusions:  cefTRIAXone (ROCEPHIN)  IV Stopped (03/18/23 2157)   dextrose 5% lactated ringers 100 mL/hr at 03/19/23 0557    Diet Order             Diet clear liquid Room service appropriate? Yes; Fluid consistency: Thin  Diet effective now                   Intake/Output Summary (Last 24 hours) at 03/19/2023 0928 Last data filed at 03/18/2023 2300 Gross per 24 hour  Intake 3497.62 ml  Output --  Net 3497.62 ml   Net IO Since Admission: 3,497.62 mL [03/19/23 0928]  Wt Readings from Last 3 Encounters:  03/19/23 120.2 kg  02/19/23 121.1 kg  02/18/23 113.4 kg     Unresulted Labs (From admission, onward)     Start     Ordered   03/19/23 0500  Basic metabolic panel  Daily,   R      03/18/23 2040   03/19/23 0500  CBC  Daily,   R  03/18/23 2040   03/19/23 0257  Miscellaneous [facility] test (send-out)  Once,   R        03/19/23 0257   03/18/23 2040  Urine Culture (for pregnant, neutropenic or urologic patients or patients with an indwelling urinary catheter)  (Urine Culture)  Add-on,   AD       Question:  Indication  Answer:  Suprapubic pain   03/18/23 2040          Data Reviewed: I have personally reviewed following labs and imaging studies CBC: Recent Labs  Lab 03/18/23 1741 03/18/23 1835 03/19/23 0303  WBC 13.4*  --  11.1*  HGB 16.2* 17.7*  17.3* 13.5  HCT 46.8* 52.0*  51.0* 39.5  MCV 77.2*  --  80.0  PLT 247  --  179   Basic Metabolic Panel: Recent Labs  Lab 03/18/23 1741 03/18/23 1835 03/19/23 0303  NA 134* 137  135 135  K 3.4* 3.1*  3.1* 3.3*  CL 96* 100 102  CO2 19*  --  22  GLUCOSE 282* 187* 88  BUN 22* 25* 18  CREATININE 2.94* 3.00* 1.99*  CALCIUM 10.7*  --  8.8*  MG  --   --  2.3   GFR: Estimated Creatinine Clearance: 45.1 mL/min (A) (by C-G formula based on SCr of 1.99 mg/dL (H)). Liver Function Tests: Recent  Labs  Lab 03/18/23 1741  AST 25  ALT 11  ALKPHOS 86  BILITOT 1.0  PROT 7.8  ALBUMIN 4.4   Recent Labs  Lab 03/18/23 1741  LIPASE 21   Recent Labs  Lab 03/18/23 2121 03/18/23 2252 03/18/23 2359 03/19/23 0339 03/19/23 0753  GLUCAP 50* 108* 108* 95 177*   Recent Labs  Lab 03/18/23 1818 03/18/23 2043 03/19/23 0303  LATICACIDVEN 5.5* 2.7* 1.0    Recent Results (from the past 240 hour(s))  Blood culture (routine x 2)     Status: None (Preliminary result)   Collection Time: 03/18/23  8:40 PM   Specimen: BLOOD RIGHT WRIST  Result Value Ref Range Status   Specimen Description BLOOD RIGHT WRIST  Final   Special Requests   Final    BOTTLES DRAWN AEROBIC AND ANAEROBIC Blood Culture adequate volume   Culture   Final    NO GROWTH < 12 HOURS Performed at [facility], [address], [place], [place] [address]    Report Status PENDING  Incomplete  Blood culture (routine x 2)     Status: None (Preliminary result)   Collection Time: 03/18/23  9:50 PM   Specimen: BLOOD RIGHT ARM  Result Value Ref Range Status   Specimen Description BLOOD RIGHT ARM  Final   Special Requests   Final    BOTTLES DRAWN AEROBIC AND ANAEROBIC Blood Culture adequate volume   Culture   Final    NO GROWTH < 12 HOURS Performed at [facility], [address], [place], [place] [address]    Report Status PENDING  Incomplete    Antimicrobials: Anti-infectives (From admission, onward)    Start     Dose/Rate Route Frequency Ordered Stop   03/18/23 2100  cefTRIAXone (ROCEPHIN) 2 g in sodium chloride 0.9 % 100 mL IVPB        2 g 200 mL/hr over 30 Minutes Intravenous Every 24 hours 03/18/23 2006     03/18/23 2000  cefTRIAXone (ROCEPHIN) 1 g in sodium chloride 0.9 % 100 mL IVPB  Status:  Discontinued        1  g 200 mL/hr over 30 Minutes Intravenous  Once 03/18/23 1954 03/18/23 2006      Culture/Microbiology    Component Value Date/Time   SDES BLOOD RIGHT ARM 03/18/2023 2150    SPECREQUEST  03/18/2023 2150    BOTTLES DRAWN AEROBIC AND ANAEROBIC Blood Culture adequate volume   CULT  03/18/2023 2150    NO GROWTH < 12 HOURS Performed at [facility] [place], [address] [place], [place] [address]    REPTSTATUS PENDING 03/18/2023 2150    Radiology Studies: DG Chest 2 View  Result Date: 03/18/2023 CLINICAL DATA:  Chest pain EXAM: CHEST - 2 VIEW COMPARISON:  December 18, 2022 FINDINGS: Stable cardiomegaly. The hila and mediastinum are normal. No pneumothorax. No nodules or masses. No focal infiltrates. IMPRESSION: No active cardiopulmonary disease. Electronically Signed   By: Gerome Sam III M.D.   On: 03/18/2023 20:03   CT ABDOMEN PELVIS W CONTRAST  Result Date: 03/18/2023 CLINICAL DATA:  Pain right lower quadrant of abdomen EXAM: CT ABDOMEN AND PELVIS WITH CONTRAST TECHNIQUE: Multidetector CT imaging of the abdomen and pelvis was performed using the standard protocol following bolus administration of intravenous contrast. RADIATION DOSE REDUCTION: This exam was performed according to the departmental dose-optimization program which includes automated exposure control, adjustment of the mA and/or kV according to patient size and/or use of iterative reconstruction technique. CONTRAST:  75mL OMNIPAQUE IOHEXOL 350 MG/ML SOLN COMPARISON:  02/18/2023 FINDINGS: Lower chest: Minimal pericardial effusion is seen. Small linear densities in the lower lung fields may suggest minimal scarring. Hepatobiliary: No focal abnormalities are seen in the liver. There is no dilation of bile ducts. Gallbladder is unremarkable. Pancreas: Pancreas is smaller than usual incised. No focal abnormalities are seen. Spleen: Unremarkable. Adrenals/Urinary Tract: Adrenals are unremarkable. There is no hydronephrosis. There are no renal or ureteral stones. Urinary bladder is unremarkable. Stomach/Bowel: Small hiatal hernia is seen. Stomach is unremarkable. Small bowel loops are not dilated. Appendix is of normal  caliber. There is no significant wall thickening in colon. There is no pericolic stranding. Vascular/Lymphatic: Vascular structures are unremarkable. There are subcentimeter nodes in mesentery and adjacent to put a bodies, possibly suggesting reactive hyperplasia of nodes. Reproductive: Uterus is enlarged in size. No dominant adnexal masses are seen. There is possible small calcification in the right adnexa. Other: There is no ascites or pneumoperitoneum. Small umbilical hernia containing fat is seen. Musculoskeletal: No acute findings are seen. IMPRESSION: There is no evidence of intestinal obstruction or pneumoperitoneum. There is no hydronephrosis. Appendix is not dilated. Minimal pericardial effusion.  Small hiatal hernia. Electronically Signed   By: Ernie Avena M.D.   On: 03/18/2023 18:59     LOS: 1 day   Lanae Boast, MD [facility]  03/19/2023, 9:28 AM

## 2023-03-19 NOTE — Telephone Encounter (Signed)
LVM for patient to call office back once discharged for refills.

## 2023-03-19 NOTE — Hospital Course (Addendum)
47 y.o.f w/ hypertension,IDDM,COPD, HFpEF, anxiety, abnormal uterine bleeding, and BMI 46 who presents to the emergency department with severe lower abdominal pain, low back pain, nausea, vomiting, and diarrhea, symptoms started with abdominal pain a week ago and worsening significantly after transplant ultrasound approximately a week PTA In the ZO:XWRUEAVW, saturating well on room air, tachypneic and tachycardic, and hypertensive. Labs are most notable for creatinine 2.94, WBC 13,400, hemoglobin 16.2, and lactic acid 5.5.  Bacteriuria and pyuria noted on UA.  Repeat lactic acid normalized.EKG> ST, prolonged QT interval.Cxr>Neg for acute cardiopulmonary disease. CT of the abdomen and pelvis> no acute finding. Blood cultures were ordered from the ED, 3 L of LR were administered, and the patient was also treated with Rocephin, Dilaudid, and Zofran and admitted for further management of acute kidney injury UTI nausea vomiting diarrhea Patient was managed with IV fluid hydration, IV antibiotics diet advanced slowly. Patient clinically improved AKI resolved, blood culture no growth so far-fortunately urine culture was not sent-she has clinically improved leukocytosis has resolved.  At this time she is stable for discharge home.

## 2023-03-20 ENCOUNTER — Other Ambulatory Visit: Payer: Self-pay

## 2023-03-20 ENCOUNTER — Encounter: Payer: Medicare Other | Admitting: Registered Nurse

## 2023-03-20 DIAGNOSIS — A419 Sepsis, unspecified organism: Secondary | ICD-10-CM | POA: Diagnosis not present

## 2023-03-20 DIAGNOSIS — N1831 Chronic kidney disease, stage 3a: Secondary | ICD-10-CM | POA: Diagnosis not present

## 2023-03-20 DIAGNOSIS — E874 Mixed disorder of acid-base balance: Secondary | ICD-10-CM | POA: Diagnosis not present

## 2023-03-20 DIAGNOSIS — E11649 Type 2 diabetes mellitus with hypoglycemia without coma: Secondary | ICD-10-CM | POA: Diagnosis not present

## 2023-03-20 DIAGNOSIS — N179 Acute kidney failure, unspecified: Secondary | ICD-10-CM | POA: Diagnosis not present

## 2023-03-20 LAB — BASIC METABOLIC PANEL
Anion gap: 9 (ref 5–15)
BUN: 10 mg/dL (ref 6–20)
CO2: 24 mmol/L (ref 22–32)
Calcium: 8.7 mg/dL — ABNORMAL LOW (ref 8.9–10.3)
Chloride: 106 mmol/L (ref 98–111)
Creatinine, Ser: 1.19 mg/dL — ABNORMAL HIGH (ref 0.44–1.00)
GFR, Estimated: 57 mL/min — ABNORMAL LOW (ref >60)
Glucose, Bld: 259 mg/dL — ABNORMAL HIGH (ref 70–99)
Potassium: 4.4 mmol/L (ref 3.5–5.1)
Sodium: 139 mmol/L (ref 135–145)

## 2023-03-20 LAB — URINALYSIS, ROUTINE W REFLEX MICROSCOPIC
Bilirubin Urine: NEGATIVE
Glucose, UA: >=500 mg/dL — AB
Ketones, ur: NEGATIVE mg/dL
Nitrite: NEGATIVE
Protein, ur: NEGATIVE mg/dL
RBC / HPF: >50 RBC/hpf (ref 0–5)
Specific Gravity, Urine: 1.01 (ref 1.005–1.030)
pH: 5 (ref 5.0–8.0)

## 2023-03-20 LAB — CBC
HCT: 38.8 % (ref 36.0–46.0)
Hemoglobin: 13.1 g/dL (ref 12.0–15.0)
MCH: 27.4 pg (ref 26.0–34.0)
MCHC: 33.8 g/dL (ref 30.0–36.0)
MCV: 81.2 fL (ref 80.0–100.0)
Platelets: 162 10*3/uL (ref 150–400)
RBC: 4.78 MIL/uL (ref 3.87–5.11)
RDW: 16.5 % — ABNORMAL HIGH (ref 11.5–15.5)
WBC: 8.4 10*3/uL (ref 4.0–10.5)
nRBC: 0 % (ref 0.0–0.2)

## 2023-03-20 LAB — GLUCOSE, CAPILLARY
Glucose-Capillary: 213 mg/dL — ABNORMAL HIGH (ref 70–99)
Glucose-Capillary: 239 mg/dL — ABNORMAL HIGH (ref 70–99)
Glucose-Capillary: 248 mg/dL — ABNORMAL HIGH (ref 70–99)
Glucose-Capillary: 266 mg/dL — ABNORMAL HIGH (ref 70–99)
Glucose-Capillary: 280 mg/dL — ABNORMAL HIGH (ref 70–99)
Glucose-Capillary: 282 mg/dL — ABNORMAL HIGH (ref 70–99)
Glucose-Capillary: 307 mg/dL — ABNORMAL HIGH (ref 70–99)

## 2023-03-20 LAB — MISC LABCORP TEST (SEND OUT): Labcorp test code: 83935

## 2023-03-20 MED ORDER — MELATONIN 5 MG PO TABS
5.0000 mg | ORAL_TABLET | Freq: Every evening | ORAL | Status: DC | PRN
  Administered 2023-03-20: 5 mg via ORAL
  Filled 2023-03-20: qty 1

## 2023-03-20 MED ORDER — HYDROMORPHONE HCL 1 MG/ML IJ SOLN
0.5000 mg | INTRAMUSCULAR | Status: DC | PRN
  Administered 2023-03-20 – 2023-03-21 (×5): 0.5 mg via INTRAVENOUS
  Filled 2023-03-20 (×5): qty 1

## 2023-03-20 MED ORDER — INSULIN ASPART 100 UNIT/ML IJ SOLN
8.0000 [IU] | Freq: Three times a day (TID) | INTRAMUSCULAR | Status: DC
  Administered 2023-03-20 – 2023-03-21 (×3): 8 [IU] via SUBCUTANEOUS

## 2023-03-20 MED ORDER — INSULIN GLARGINE-YFGN 100 UNIT/ML ~~LOC~~ SOLN
25.0000 [IU] | Freq: Every day | SUBCUTANEOUS | Status: DC
  Administered 2023-03-20 – 2023-03-21 (×2): 25 [IU] via SUBCUTANEOUS
  Filled 2023-03-20 (×2): qty 0.25

## 2023-03-20 MED ORDER — METRONIDAZOLE 500 MG PO TABS
500.0000 mg | ORAL_TABLET | Freq: Two times a day (BID) | ORAL | Status: DC
  Administered 2023-03-20 – 2023-03-21 (×3): 500 mg via ORAL
  Filled 2023-03-20 (×3): qty 1

## 2023-03-20 MED ORDER — TIZANIDINE HCL 2 MG PO TABS
2.0000 mg | ORAL_TABLET | Freq: Three times a day (TID) | ORAL | Status: DC | PRN
  Administered 2023-03-20 – 2023-03-21 (×2): 2 mg via ORAL
  Filled 2023-03-20 (×3): qty 1

## 2023-03-20 NOTE — Progress Notes (Signed)
PROGRESS NOTE Jill Shaw  ZOX:[id number] DOB: April 20, 1976 DOA: 03/18/2023 PCP: Ivonne Andrew, NP  Brief Narrative/Hospital Course: 47 y.o.f w/ hypertension,IDDM,COPD, HFpEF, anxiety, abnormal uterine bleeding, and BMI 46 who presents to the emergency department with severe lower abdominal pain, low back pain, nausea, vomiting, and diarrhea, symptoms started with abdominal pain a week ago and worsening significantly after transplant ultrasound approximately a week PTA In the WJ:XBJYNWGN, saturating well on room air, tachypneic and tachycardic, and hypertensive. Labs are most notable for creatinine 2.94, WBC 13,400, hemoglobin 16.2, and lactic acid 5.5.  Bacteriuria and pyuria noted on UA.  Repeat lactic acid normalized. EKG> ST, prolonged QT interval. Cxr>Neg for acute cardiopulmonary disease. CT of the abdomen and pelvis> no acute finding. Blood cultures were ordered from the ED, 3 L of LR were administered, and the patient was also treated with Rocephin, Dilaudid, and Zofran and admitted for further management of acute kidney injury UTI nausea vomiting diarrhea     Subjective: Seen and examined this morning overall she feels much better  No nausea vomiting less pain  Labs shows improved renal function  Not able to walk months mother at the bedside requesting PT evaluation  Assessment and Plan: Principal Problem:   Acute renal failure superimposed on stage 3a chronic kidney disease (HCC) Active Problems:   Essential hypertension   Chronic obstructive pulmonary disease (HCC)   Lactic acidosis   Chronic diastolic CHF (congestive heart failure) (HCC)   Acute cystitis   Type 2 diabetes mellitus without complication, with long-term current use of insulin (HCC)   Nausea vomiting and diarrhea   Hypokalemia   Prolonged QT interval   Hypertensive urgency  AKI superimposed on CKD 3a ;b/l creat ~1.2: No obstruction on CT imaging likely prerenal in the setting of nausea vomiting diarrhea  and meds including ARB/diuretics.  Creatinine improved with IV fluid hydration -wean off ivf, monitor bmp. Recent Labs    12/18/22 0054 12/21/22 0702 12/21/22 2313 12/22/22 0132 01/04/23 1556 01/17/23 0920 02/18/23 0354 03/18/23 1741 03/18/23 1835 03/19/23 0303 03/20/23 0419  BUN 6 13 15 15 11  5* 13 22* 25* 18 10  CREATININE 1.19* 1.66* 1.90* 1.63* 1.15* 1.20* 1.58* 2.94* 3.00* 1.99* 1.19*  CO2 24 25 18* 20* 24 18* 18* 19*  --  22 24     Severe sepsis POA due to UTI UTI: severe sepsis as evidenced by lactic acidosis leukocytosis and AKI UTI: Not send reordered UA and urine culture continue antibiotics add Flagyl for trichomonas in urine.  Currently BP stable leukocytosis improved Recent Labs  Lab 03/18/23 1741 03/18/23 1818 03/18/23 2043 03/19/23 0303 03/20/23 0419  WBC 13.4*  --   --  11.1* 8.4  LATICACIDVEN  --  5.5* 2.7* 1.0  --       Nausea vomiting diarrhea likely from UTI _CT abdomen pelvis benign continue IV hydration, antiemetics.  So far tolerating diet  Hypertensive urgency:SBP as high as 200 in ED in setting of pain .  BP currently well-controlled continue metoprolol  Chronic HFpEF : She is hypovolemic on admission .  Continue to hold diuretics.  Needing IV fluids for AKI.  IV fluids hopefully can resume her home meds on Cont to monitor daily I/O,weight, electrolytes and net balance as below.Keep on  salt/fluid restricted diet and monitor in tele. Net IO Since Admission: 3,297.62 mL [03/20/23 1122]  Filed Weights   03/19/23 0452  Weight: 120.2 kg    IDDM: With hypoglycemia blood sugar up to 50s> subsequently poorly  controlled up to 400s resuming home long-acting and Premeal insulin at lower dose and SSI. Normally premeal insulin, Toujeo 60 units in the morning and 50 u premeal Recent Labs  Lab 03/19/23 1622 03/19/23 2123 03/20/23 0006 03/20/23 0347 03/20/23 0752  GLUCAP 407* 287* 307* 280* 239*     Prolonged QT interval :QTc is 561 on  admission.Supplement potassium, check magnesium level, avoid QT-prolonging medication  Hypokalemia replace and recheck. Recent Labs  Lab 03/18/23 1741 03/18/23 1835 03/19/23 0303 03/20/23 0419  K 3.4* 3.1*  3.1* 3.3* 4.4     Vaginal bleeding x 3 months and seeing OBGYN recently has TVS US limited and has follow up on June 20 with scope/biopsy papa smear. This is at [facility] (Dr Trula Ore).  Morbid Obesity:Patient's Body mass index is 45.49 kg/m. : Will benefit with PCP follow-up, weight loss  healthy lifestyle and outpatient sleep evaluation.  DVT prophylaxis: heparin injection 5,000 Units Start: 03/18/23 2200 Code Status:   Code Status: Full Code Family Communication: plan of care discussed with patient at bedside. Patient status is:  INPATIENT  because of aki Level of care: Progressive   Dispo: The patient is from: Home,lives with her mom            Anticipated disposition:tomorrow.  Getting PT evaluation today Objective: Vitals last 24 hrs: Vitals:   03/19/23 1924 03/20/23 0344 03/20/23 0744 03/20/23 0805  BP: (!) 146/82 (!) 156/83  (!) 169/74  Pulse: 76 76    Resp: 20 20    Temp: 98.7 F (37.1 C) 98.7 F (37.1 C)  98.5 F (36.9 C)  TempSrc: Oral Oral  Oral  SpO2: 99% 99% 99%   Weight:       Weight change:   Physical Examination: General exam: AAox3, weak weak,older appearing HEENT:Oral mucosa moist, Ear/Nose WNL grossly, dentition normal. Respiratory system: bilaterally diminished BS,no use of accessory muscle Cardiovascular system: S1 & S2 +, regular rate. Gastrointestinal system: Abdomen soft, oebseNT,ND,BS+ Nervous System:Alert, awake, moving extremities and grossly nonfocal Extremities: LE ankle edema neg, lower extremities warm Skin: No rashes,no icterus. MSK: Normal muscle bulk,tone, power   Medications reviewed:  Scheduled Meds:  heparin  5,000 Units Subcutaneous Q8H   insulin aspart  0-9 Units Subcutaneous Q4H   insulin aspart  8  Units Subcutaneous TID WC   insulin glargine-yfgn  25 Units Subcutaneous Daily   metoprolol succinate  100 mg Oral Daily   metroNIDAZOLE  500 mg Oral Q12H   nicotine  14 mg Transdermal Daily   pantoprazole  40 mg Oral Daily   rosuvastatin  20 mg Oral Daily   sodium chloride flush  3 mL Intravenous Q12H   umeclidinium bromide  1 puff Inhalation Daily   Continuous Infusions:  sodium chloride 50 mL/hr at 03/20/23 1022   cefTRIAXone (ROCEPHIN)  IV 2 g (03/19/23 2120)    Diet Order             Diet heart healthy/carb modified Room service appropriate? Yes; Fluid consistency: Thin  Diet effective now                   Intake/Output Summary (Last 24 hours) at 03/20/2023 1122 Last data filed at 03/19/2023 2025 Gross per 24 hour  Intake 1000 ml  Output 1200 ml  Net -200 ml    Net IO Since Admission: 3,297.62 mL [03/20/23 1122]  Wt Readings from Last 3 Encounters:  03/19/23 120.2 kg  02/19/23 121.1 kg  02/18/23 113.4 kg  Unresulted Labs (From admission, onward)     Start     Ordered   03/20/23 0854  Urine Culture (for pregnant, neutropenic or urologic patients or patients with an indwelling urinary catheter)  (Urine Labs)  Once,   R       Question:  Indication  Answer:  Flank Pain   03/20/23 0853   03/19/23 0500  Basic metabolic panel  Daily,   R      03/18/23 2040   03/19/23 0500  CBC  Daily,   R      03/18/23 2040   03/18/23 2040  Urine Culture (for pregnant, neutropenic or urologic patients or patients with an indwelling urinary catheter)  (Urine Culture)  Add-on,   AD       Question:  Indication  Answer:  Suprapubic pain   03/18/23 2040          Data Reviewed: I have personally reviewed following labs and imaging studies CBC: Recent Labs  Lab 03/18/23 1741 03/18/23 1835 03/19/23 0303 03/20/23 0419  WBC 13.4*  --  11.1* 8.4  HGB 16.2* 17.7*  17.3* 13.5 13.1  HCT 46.8* 52.0*  51.0* 39.5 38.8  MCV 77.2*  --  80.0 81.2  PLT 247  --  179 162    Basic  Metabolic Panel: Recent Labs  Lab 03/18/23 1741 03/18/23 1835 03/19/23 0303 03/20/23 0419  NA 134* 137  135 135 139  K 3.4* 3.1*  3.1* 3.3* 4.4  CL 96* 100 102 106  CO2 19*  --  22 24  GLUCOSE 282* 187* 88 259*  BUN 22* 25* 18 10  CREATININE 2.94* 3.00* 1.99* 1.19*  CALCIUM 10.7*  --  8.8* 8.7*  MG  --   --  2.3  --     GFR: Estimated Creatinine Clearance: 75.4 mL/min (A) (by C-G formula based on SCr of 1.19 mg/dL (H)). Liver Function Tests: Recent Labs  Lab 03/18/23 1741  AST 25  ALT 11  ALKPHOS 86  BILITOT 1.0  PROT 7.8  ALBUMIN 4.4    Recent Labs  Lab 03/18/23 1741  LIPASE 21    Recent Labs  Lab 03/19/23 1622 03/19/23 2123 03/20/23 0006 03/20/23 0347 03/20/23 0752  GLUCAP 407* 287* 307* 280* 239*    Recent Labs  Lab 03/18/23 1818 03/18/23 2043 03/19/23 0303  LATICACIDVEN 5.5* 2.7* 1.0     Recent Results (from the past 240 hour(s))  Blood culture (routine x 2)     Status: None (Preliminary result)   Collection Time: 03/18/23  8:40 PM   Specimen: BLOOD RIGHT WRIST  Result Value Ref Range Status   Specimen Description BLOOD RIGHT WRIST  Final   Special Requests   Final    BOTTLES DRAWN AEROBIC AND ANAEROBIC Blood Culture adequate volume   Culture   Final    NO GROWTH < 12 HOURS Performed at [facility], [address], [place], [place] [address]    Report Status PENDING  Incomplete  Blood culture (routine x 2)     Status: None (Preliminary result)   Collection Time: 03/18/23  9:50 PM   Specimen: BLOOD RIGHT ARM  Result Value Ref Range Status   Specimen Description BLOOD RIGHT ARM  Final   Special Requests   Final    BOTTLES DRAWN AEROBIC AND ANAEROBIC Blood Culture adequate volume   Culture   Final    NO GROWTH < 12 HOURS Performed at [facility], [address]  [place], [place] [address]    Report Status PENDING  Incomplete    Antimicrobials: Anti-infectives (From admission, onward)    Start     Dose/Rate Route  Frequency Ordered Stop   03/20/23 1215  metroNIDAZOLE (FLAGYL) tablet 500 mg        500 mg Oral Every 12 hours 03/20/23 1121 03/23/23 0959   03/18/23 2100  cefTRIAXone (ROCEPHIN) 2 g in sodium chloride 0.9 % 100 mL IVPB        2 g 200 mL/hr over 30 Minutes Intravenous Every 24 hours 03/18/23 2006     03/18/23 2000  cefTRIAXone (ROCEPHIN) 1 g in sodium chloride 0.9 % 100 mL IVPB  Status:  Discontinued        1 g 200 mL/hr over 30 Minutes Intravenous  Once 03/18/23 1954 03/18/23 2006      Culture/Microbiology    Component Value Date/Time   SDES BLOOD RIGHT ARM 03/18/2023 2150   SPECREQUEST  03/18/2023 2150    BOTTLES DRAWN AEROBIC AND ANAEROBIC Blood Culture adequate volume   CULT  03/18/2023 2150    NO GROWTH < 12 HOURS Performed at [facility], [address], [place], [place] [address]    REPTSTATUS PENDING 03/18/2023 2150    Radiology Studies: DG Chest 2 View  Result Date: 03/18/2023 CLINICAL DATA:  Chest pain EXAM: CHEST - 2 VIEW COMPARISON:  December 18, 2022 FINDINGS: Stable cardiomegaly. The hila and mediastinum are normal. No pneumothorax. No nodules or masses. No focal infiltrates. IMPRESSION: No active cardiopulmonary disease. Electronically Signed   By: Gerome Sam III M.D.   On: 03/18/2023 20:03   CT ABDOMEN PELVIS W CONTRAST  Result Date: 03/18/2023 CLINICAL DATA:  Pain right lower quadrant of abdomen EXAM: CT ABDOMEN AND PELVIS WITH CONTRAST TECHNIQUE: Multidetector CT imaging of the abdomen and pelvis was performed using the standard protocol following bolus administration of intravenous contrast. RADIATION DOSE REDUCTION: This exam was performed according to the departmental dose-optimization program which includes automated exposure control, adjustment of the mA and/or kV according to patient size and/or use of iterative reconstruction technique. CONTRAST:  75mL OMNIPAQUE IOHEXOL 350 MG/ML SOLN COMPARISON:  02/18/2023 FINDINGS: Lower chest: Minimal pericardial  effusion is seen. Small linear densities in the lower lung fields may suggest minimal scarring. Hepatobiliary: No focal abnormalities are seen in the liver. There is no dilation of bile ducts. Gallbladder is unremarkable. Pancreas: Pancreas is smaller than usual incised. No focal abnormalities are seen. Spleen: Unremarkable. Adrenals/Urinary Tract: Adrenals are unremarkable. There is no hydronephrosis. There are no renal or ureteral stones. Urinary bladder is unremarkable. Stomach/Bowel: Small hiatal hernia is seen. Stomach is unremarkable. Small bowel loops are not dilated. Appendix is of normal caliber. There is no significant wall thickening in colon. There is no pericolic stranding. Vascular/Lymphatic: Vascular structures are unremarkable. There are subcentimeter nodes in mesentery and adjacent to put a bodies, possibly suggesting reactive hyperplasia of nodes. Reproductive: Uterus is enlarged in size. No dominant adnexal masses are seen. There is possible small calcification in the right adnexa. Other: There is no ascites or pneumoperitoneum. Small umbilical hernia containing fat is seen. Musculoskeletal: No acute findings are seen. IMPRESSION: There is no evidence of intestinal obstruction or pneumoperitoneum. There is no hydronephrosis. Appendix is not dilated. Minimal pericardial effusion.  Small hiatal hernia. Electronically Signed   By: Ernie Avena M.D.   On: 03/18/2023 18:59     LOS: 2 days   Lanae Boast, MD [facility]  03/20/2023, 11:22 AM

## 2023-03-20 NOTE — Plan of Care (Signed)
  Problem: Skin Integrity: Goal: Risk for impaired skin integrity will decrease Outcome: Progressing   Problem: Tissue Perfusion: Goal: Adequacy of tissue perfusion will improve Outcome: Progressing   
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
## 2023-03-20 NOTE — Evaluation (Signed)
Physical Therapy Evaluation Patient Details Name: Jill Shaw MRN: [id number] DOB: 1975-11-20 Today's Date: 03/20/2023  History of Present Illness  Pt is 47 year old presented to [facility] on  03/18/23 for severe sepsis due to UTI and AKI on CKD. PMH - HTN, DM, copd, chf, anxiety, seizures  Clinical Impression  Pt able to amb in hallway without difficulty. Pt reports some days she has more trouble due to back/leg pain. Reports she has had PT in the past for the back pain. Not currently interested in follow up PT for that.        Recommendations for follow up therapy are one component of a multi-disciplinary discharge planning process, led by the attending physician.  Recommendations may be updated based on patient status, additional functional criteria and insurance authorization.  Follow Up Recommendations       Assistance Recommended at Discharge PRN  Patient can return home with the following       Equipment Recommendations None recommended by PT  Recommendations for Other Services       Functional Status Assessment Patient has not had a recent decline in their functional status     Precautions / Restrictions Precautions Precautions: Fall      Mobility  Bed Mobility Overal bed mobility: Modified Independent                  Transfers Overall transfer level: Modified independent Equipment used: None                    Ambulation/Gait Ambulation/Gait assistance: Modified independent (Device/Increase time) Gait Distance (Feet): 220 Feet Assistive device: Rolling walker (2 wheels) Gait Pattern/deviations: Step-through pattern, Wide base of support Gait velocity: decr Gait velocity interpretation: 1.31 - 2.62 ft/sec, indicative of limited community ambulator   General Gait Details: Steady gait  Stairs            Wheelchair Mobility    Modified Rankin (Stroke Patients Only)       Balance Overall balance assessment: Mild deficits observed,  not formally tested                                           Pertinent Vitals/Pain Pain Assessment Pain Assessment: Faces Faces Pain Scale: Hurts a little bit Pain Location: back Pain Intervention(s): Limited activity within patient's tolerance    Home Living Family/patient expects to be discharged to:: Private residence Living Arrangements: Parent Available Help at Discharge: Family (mother) Type of Home: Apartment Home Access: Stairs to enter Entrance Stairs-Rails: Right Entrance Stairs-Number of Steps: 13   Home Layout: One level Home Equipment: Agricultural consultant (2 wheels);Cane - single point      Prior Function Prior Level of Function : Needs assist       Physical Assist : Mobility (physical) Mobility (physical): Gait   Mobility Comments: Pt uses rolling walker or cane sometimes. She says sometimes her mom has to help her if she is having a bad day. Back and leg pain are what limits patient on some days       Hand Dominance   Dominant Hand: Right    Extremity/Trunk Assessment   Upper Extremity Assessment Upper Extremity Assessment: Overall WFL for tasks assessed    Lower Extremity Assessment Lower Extremity Assessment: Overall WFL for tasks assessed       Communication   Communication: No difficulties  Cognition Arousal/Alertness:  Awake/alert Behavior During Therapy: WFL for tasks assessed/performed Overall Cognitive Status: Within Functional Limits for tasks assessed                                          General Comments      Exercises     Assessment/Plan    PT Assessment Patient does not need any further PT services  PT Problem List         PT Treatment Interventions      PT Goals (Current goals can be found in the Care Plan section)  Acute Rehab PT Goals PT Goal Formulation: All assessment and education complete, DC therapy    Frequency       Co-evaluation               AM-PAC PT "6  Clicks" Mobility  Outcome Measure Help needed turning from your back to your side while in a flat bed without using bedrails?: None Help needed moving from lying on your back to sitting on the side of a flat bed without using bedrails?: None Help needed moving to and from a bed to a chair (including a wheelchair)?: None Help needed standing up from a chair using your arms (e.g., wheelchair or bedside chair)?: None Help needed to walk in hospital room?: None Help needed climbing 3-5 steps with a railing? : A Little 6 Click Score: 23    End of Session   Activity Tolerance: Patient tolerated treatment well Patient left: in bed;with call bell/phone within reach;with nursing/sitter in room Nurse Communication: Mobility status PT Visit Diagnosis: Other abnormalities of gait and mobility (R26.89)    Time: 1550-1601 PT Time Calculation (min) (ACUTE ONLY): 11 min   Charges:   PT Evaluation $PT Eval Low Complexity: 1 Low          [facility] PT Acute Rehabilitation Services Office [phone]   [facility] 03/20/2023, 4:31 PM

## 2023-03-21 ENCOUNTER — Encounter: Payer: Medicare Other | Attending: Physical Medicine and Rehabilitation | Admitting: Registered Nurse

## 2023-03-21 ENCOUNTER — Telehealth: Payer: Self-pay | Admitting: *Deleted

## 2023-03-21 ENCOUNTER — Other Ambulatory Visit (HOSPITAL_COMMUNITY): Payer: Self-pay

## 2023-03-21 ENCOUNTER — Other Ambulatory Visit: Payer: Self-pay

## 2023-03-21 ENCOUNTER — Encounter: Payer: Self-pay | Admitting: Registered Nurse

## 2023-03-21 VITALS — BP 158/82 | HR 78 | Ht 64.0 in | Wt 271.0 lb

## 2023-03-21 DIAGNOSIS — M25511 Pain in right shoulder: Secondary | ICD-10-CM | POA: Insufficient documentation

## 2023-03-21 DIAGNOSIS — G8929 Other chronic pain: Secondary | ICD-10-CM

## 2023-03-21 DIAGNOSIS — G629 Polyneuropathy, unspecified: Secondary | ICD-10-CM | POA: Diagnosis not present

## 2023-03-21 DIAGNOSIS — Z79891 Long term (current) use of opiate analgesic: Secondary | ICD-10-CM

## 2023-03-21 DIAGNOSIS — G894 Chronic pain syndrome: Secondary | ICD-10-CM

## 2023-03-21 DIAGNOSIS — M5416 Radiculopathy, lumbar region: Secondary | ICD-10-CM | POA: Diagnosis not present

## 2023-03-21 DIAGNOSIS — M170 Bilateral primary osteoarthritis of knee: Secondary | ICD-10-CM | POA: Diagnosis not present

## 2023-03-21 DIAGNOSIS — Z5181 Encounter for therapeutic drug level monitoring: Secondary | ICD-10-CM | POA: Diagnosis not present

## 2023-03-21 DIAGNOSIS — Z87448 Personal history of other diseases of urinary system: Secondary | ICD-10-CM

## 2023-03-21 DIAGNOSIS — M25512 Pain in left shoulder: Secondary | ICD-10-CM | POA: Diagnosis not present

## 2023-03-21 DIAGNOSIS — N179 Acute kidney failure, unspecified: Secondary | ICD-10-CM | POA: Diagnosis not present

## 2023-03-21 DIAGNOSIS — N1831 Chronic kidney disease, stage 3a: Secondary | ICD-10-CM | POA: Diagnosis not present

## 2023-03-21 HISTORY — DX: Personal history of other diseases of urinary system: Z87.448

## 2023-03-21 LAB — BASIC METABOLIC PANEL
Anion gap: 11 (ref 5–15)
BUN: 11 mg/dL (ref 6–20)
CO2: 22 mmol/L (ref 22–32)
Calcium: 9 mg/dL (ref 8.9–10.3)
Chloride: 103 mmol/L (ref 98–111)
Creatinine, Ser: 1.18 mg/dL — ABNORMAL HIGH (ref 0.44–1.00)
GFR, Estimated: 58 mL/min — ABNORMAL LOW (ref >60)
Glucose, Bld: 231 mg/dL — ABNORMAL HIGH (ref 70–99)
Potassium: 4.2 mmol/L (ref 3.5–5.1)
Sodium: 136 mmol/L (ref 135–145)

## 2023-03-21 LAB — GLUCOSE, CAPILLARY: Glucose-Capillary: 250 mg/dL — ABNORMAL HIGH (ref 70–99)

## 2023-03-21 LAB — CBC
HCT: 36.2 % (ref 36.0–46.0)
Hemoglobin: 12.2 g/dL (ref 12.0–15.0)
MCH: 27.7 pg (ref 26.0–34.0)
MCHC: 33.7 g/dL (ref 30.0–36.0)
MCV: 82.3 fL (ref 80.0–100.0)
Platelets: 148 10*3/uL — ABNORMAL LOW (ref 150–400)
RBC: 4.4 MIL/uL (ref 3.87–5.11)
RDW: 16.6 % — ABNORMAL HIGH (ref 11.5–15.5)
WBC: 9.6 10*3/uL (ref 4.0–10.5)
nRBC: 0 % (ref 0.0–0.2)

## 2023-03-21 LAB — CULTURE, BLOOD (ROUTINE X 2): Special Requests: ADEQUATE

## 2023-03-21 MED ORDER — CEFDINIR 300 MG PO CAPS
300.0000 mg | ORAL_CAPSULE | Freq: Two times a day (BID) | ORAL | 0 refills | Status: AC
  Filled 2023-03-21: qty 8, 4d supply, fill #0

## 2023-03-21 MED ORDER — HYDROCHLOROTHIAZIDE 12.5 MG PO TABS
12.5000 mg | ORAL_TABLET | Freq: Every day | ORAL | Status: DC
  Administered 2023-03-21: 12.5 mg via ORAL
  Filled 2023-03-21: qty 1

## 2023-03-21 MED ORDER — HYDROCHLOROTHIAZIDE 12.5 MG PO CAPS
12.5000 mg | ORAL_CAPSULE | Freq: Every day | ORAL | Status: DC
  Filled 2023-03-21: qty 1

## 2023-03-21 MED ORDER — OXYCODONE-ACETAMINOPHEN 10-325 MG PO TABS
1.0000 | ORAL_TABLET | Freq: Every day | ORAL | 0 refills | Status: DC | PRN
  Filled 2023-03-21: qty 140, 30d supply, fill #0

## 2023-03-21 MED ORDER — ONDANSETRON HCL 4 MG PO TABS
4.0000 mg | ORAL_TABLET | Freq: Three times a day (TID) | ORAL | 0 refills | Status: DC | PRN
  Filled 2023-03-21: qty 20, 7d supply, fill #0

## 2023-03-21 MED ORDER — FUROSEMIDE 40 MG PO TABS
40.0000 mg | ORAL_TABLET | Freq: Two times a day (BID) | ORAL | Status: DC
  Administered 2023-03-21: 40 mg via ORAL
  Filled 2023-03-21: qty 1

## 2023-03-21 MED ORDER — METRONIDAZOLE 500 MG PO TABS
500.0000 mg | ORAL_TABLET | Freq: Two times a day (BID) | ORAL | 0 refills | Status: AC
  Filled 2023-03-21: qty 10, 5d supply, fill #0

## 2023-03-21 NOTE — Discharge Summary (Signed)
Physician Discharge Summary  Jill Shaw:[id number] DOB: 05/17/1976 DOA: 03/18/2023  PCP: Ivonne Andrew, NP  Admit date: 03/18/2023 Discharge date: 03/21/2023 Recommendations for Outpatient Follow-up:  Follow up with PCP in 1 weeks-call for appointment Please obtain BMP/CBC in one week  Discharge Dispo: hOME  Discharge Condition: Stable Code Status:   Code Status: Full Code Diet recommendation:  Diet Order             Diet heart healthy/carb modified Room service appropriate? Yes; Fluid consistency: Thin  Diet effective now                    Brief/Interim Summary: 47 y.o.f w/ hypertension,IDDM,COPD, HFpEF, anxiety, abnormal uterine bleeding, and BMI 46 who presents to the emergency department with severe lower abdominal pain, low back pain, nausea, vomiting, and diarrhea, symptoms started with abdominal pain a week ago and worsening significantly after transplant ultrasound approximately a week PTA In the WJ:XBJYNWGN, saturating well on room air, tachypneic and tachycardic, and hypertensive. Labs are most notable for creatinine 2.94, WBC 13,400, hemoglobin 16.2, and lactic acid 5.5.  Bacteriuria and pyuria noted on UA.  Repeat lactic acid normalized.EKG> ST, prolonged QT interval.Cxr>Neg for acute cardiopulmonary disease. CT of the abdomen and pelvis> no acute finding. Blood cultures were ordered from the ED, 3 L of LR were administered, and the patient was also treated with Rocephin, Dilaudid, and Zofran and admitted for further management of acute kidney injury UTI nausea vomiting diarrhea Patient was managed with IV fluid hydration, IV antibiotics diet advanced slowly. Patient clinically improved AKI resolved, blood culture no growth so far-fortunately urine culture was not sent-she has clinically improved leukocytosis has resolved.  At this time she is stable for discharge home.    Discharge Diagnoses:  Principal Problem:   Acute renal failure superimposed on stage 3a  chronic kidney disease (HCC) Active Problems:   Essential hypertension   Chronic obstructive pulmonary disease (HCC)   Lactic acidosis   Chronic diastolic CHF (congestive heart failure) (HCC)   Acute cystitis   Type 2 diabetes mellitus without complication, with long-term current use of insulin (HCC)   Nausea vomiting and diarrhea   Hypokalemia   Prolonged QT interval   Hypertensive urgency  AKI superimposed on CKD 3a ;b/l creat ~1.2: No obstruction on CT imaging likely prerenal in the setting of nausea vomiting diarrhea and meds including ARB/diuretics.  Creatinine improved with IV fluid hydration -off ivf, tolerating oral diet resume home meds and follow-up with PCP .  Creatinine 1.18 this morning   Recent Labs  Lab 03/18/23 1741 03/18/23 1835 03/19/23 0303 03/20/23 0419 03/21/23 0345  BUN 22* 25* 18 10 11   CREATININE 2.94* 3.00* 1.99* 1.19* 1.18*    Severe sepsis POA due to UTI UTI: severe sepsis as evidenced by lactic acidosis leukocytosis and AKI UTI: Clinically improved unfortunately urine culture was not sent since improved on ceftriaxone will discharge on Omnicef, continue Flagyl short course for trichomonas in urine (reports she is currently not sexually active and has no partner) Recent Labs  Lab 03/18/23 1741 03/18/23 1818 03/18/23 2043 03/19/23 0303 03/20/23 0419 03/21/23 0345  WBC 13.4*  --   --  11.1* 8.4 9.6  LATICACIDVEN  --  5.5* 2.7* 1.0  --   --      Nausea vomiting diarrhea likely from UTI:CT abdomen pelvis benign.resolved.Tolerating diet.cont zofran prn  Hypertensive urgency:SBP as high as 200 in ED in setting of pain .  BP currently well-controlled continue  metoprolol and resume rest of the home medication  Chronic HFpEF : She was hypovolemic on admission .  Since AKI has resolved - resume diuretics and home meds on d/c.She will follow-up with [facility]Net IO Since Admission: 3,747.76 mL [03/21/23 1036]  Filed Weights   03/19/23 0452 03/21/23 0352   Weight: 120.2 kg 122.7 kg    IDDM: With hypoglycemia blood sugar up to 50s> subsequently poorly controlled up to 400s resumED home long-acting and Premeal insulin at lower dose and SSI.  Overall blood sugar stabilized SHE will continue to monitor blood sugar at home and continue home regimen Recent Labs  Lab 03/20/23 1143 03/20/23 1605 03/20/23 2006 03/20/23 2320 03/21/23 0355  GLUCAP 282* 248* 266* 213* 250*    Prolonged QT interval :QTc is 561 on admission.Supplement potassium, check magnesium level, avoid QT-prolonging medication  Hypokalemia replace and recheck. Recent Labs  Lab 03/18/23 1741 03/18/23 1835 03/19/23 0303 03/20/23 0419 03/21/23 0345  K 3.4* 3.1*  3.1* 3.3* 4.4 4.2    Vaginal bleeding x 3 months and seeing OBGYN recently has TVS US limited and has follow up on June 20 with scope/biopsy papa smear. This is at [facility] center (Dr Trula Ore).  Chronic pain followed by pain management send resume home medication follow-up with pain management for her refills  Morbid Obesity:Patient's Body mass index is 46.41 kg/m. : Will benefit with PCP follow-up, weight loss  healthy lifestyle and outpatient sleep evaluation.  Consults: NONE  Subjective: Alert oriented pain is controlled resting comfortably mother at the bedside, tolerating diet.  Discharge Exam: Vitals:   03/21/23 0811 03/21/23 0839  BP:  (!) 158/84  Pulse:  74  Resp:  18  Temp:  98.1 F (36.7 C)  SpO2: 99% 99%   General: Pt is alert, awake, not in acute distress Cardiovascular: RRR, S1/S2 +, no rubs, no gallops Respiratory: CTA bilaterally, no wheezing, no rhonchi Abdominal: Soft, NT, ND, bowel sounds + Extremities: no edema, no cyanosis  Discharge Instructions  Discharge Instructions     Discharge instructions   Complete by: As directed    Please call call MD or return to ER for similar or worsening recurring problem that brought you to hospital or if any  fever,nausea/vomiting,abdominal pain, uncontrolled pain, chest pain,  shortness of breath or any other alarming symptoms.  Please follow-up with your pain management clinic  Please follow-up your doctor as instructed in a week time and call the office for appointment.  Please avoid alcohol, smoking, or any other illicit substance and maintain healthy habits including taking your regular medications as prescribed.  You were cared for by a hospitalist during your hospital stay. If you have any questions about your discharge medications or the care you received while you were in the hospital after you are discharged, you can call the unit and ask to speak with the hospitalist on call if the hospitalist that took care of you is not available.  Once you are discharged, your primary care physician will handle any further medical issues. Please note that NO REFILLS for any discharge medications will be authorized once you are discharged, as it is imperative that you return to your primary care physician (or establish a relationship with a primary care physician if you do not have one) for your aftercare needs so that they can reassess your need for medications and monitor your lab values   Increase activity slowly   Complete by: As directed       Allergies as of  03/21/2023       Reactions   Elavil [amitriptyline] Other (See Comments)   Coma   Orudis [ketoprofen] Nausea And Vomiting   Desyrel [trazodone] Nausea And Vomiting   Aspirin Nausea Only   Motrin [ibuprofen] Nausea And Vomiting   Naprosyn [naproxen] Nausea And Vomiting   Neurontin [gabapentin] Nausea And Vomiting, Other (See Comments)   upset stomach   Sulfa Antibiotics Nausea And Vomiting   Ultram [tramadol] Nausea And Vomiting, Other (See Comments)   stomach upset   Victoza [liraglutide] Nausea And Vomiting   Zegerid [omeprazole-sodium Bicarbonate] Nausea And Vomiting        Medication List     TAKE these medications     Accu-Chek Guide test strip Generic drug: glucose blood USE TO TEST BLOOD GLUCOSE AS DIRECTED UP TO 4 TIMES DAILY   Accu-Chek Softclix Lancets lancets USE TO TEST AS DIRECTED UP TO FOUR TIMES DAILY.   albuterol (2.5 MG/3ML) 0.083% nebulizer solution Commonly known as: PROVENTIL Inhale into the lungs.   albuterol 108 (90 Base) MCG/ACT inhaler Commonly known as: VENTOLIN HFA Inhale 2 puffs into the lungs every 6 (six) hours as needed for wheezing or shortness of breath.   B-D ULTRAFINE III SHORT PEN 31G X 8 MM Misc Generic drug: Insulin Pen Needle USE AS DIRECTED EVERY MORNING, NOON, EVENING, AND AT BEDTIME   cefdinir 300 MG capsule Commonly known as: OMNICEF Take 1 capsule (300 mg total) by mouth 2 (two) times daily for 4 days.   Dexlansoprazole 30 MG capsule DR TAKE 1 CAPSULE (30 MG TOTAL) BY MOUTH DAILY. NEEDS OFFICE VISIT FOR ADDITIONAL REFILLS   dicyclomine 20 MG tablet Commonly known as: BENTYL Take 1 tablet (20 mg total) by mouth 2 (two) times daily.   FreeStyle Libre 3 Sensor Misc Place 1 sensor on the skin every 14 days. Use to check glucose continuously   furosemide 40 MG tablet Commonly known as: LASIX TAKE 1 TABLET(40 MG) BY MOUTH TWICE DAILY What changed: See the new instructions.   HumaLOG KwikPen 200 UNIT/ML KwikPen Generic drug: insulin lispro Inject 50 Units into the skin 3 (three) times daily before meals.   hydrochlorothiazide 12.5 MG capsule Commonly known as: MICROZIDE TAKE 1 CAPSULE(12.5 MG) BY MOUTH DAILY What changed: See the new instructions.   losartan 100 MG tablet Commonly known as: COZAAR Take 1 tablet (100 mg total) by mouth daily.   megestrol 40 MG tablet Commonly known as: MEGACE Take two tablets three times daily for 3 days, then two tablets twice daily   metoCLOPramide 10 MG tablet Commonly known as: REGLAN Take 1 tablet (10 mg total) by mouth every 6 (six) hours as needed for nausea.   metoprolol succinate 100 MG 24 hr  tablet Commonly known as: TOPROL-XL Take 100 mg by mouth daily. Take with or immediately following a meal.   metroNIDAZOLE 500 MG tablet Commonly known as: FLAGYL Take 1 tablet (500 mg total) by mouth every 12 (twelve) hours for 5 days.   ondansetron 4 MG tablet Commonly known as: Zofran Take 1 tablet (4 mg total) by mouth every 8 (eight) hours as needed for nausea or vomiting.   oxyCODONE-acetaminophen 10-325 MG tablet Commonly known as: Percocet Take 1 tablet by mouth 5 (five) times daily as needed for pain.   Pimozide 1 MG Tabs Take by mouth.   potassium chloride SA 20 MEQ tablet Commonly known as: KLOR-CON M Take 1 tablet (20 mEq total) by mouth daily.   rosuvastatin 20 MG tablet  Commonly known as: CRESTOR TAKE 1 TABLET(20 MG) BY MOUTH DAILY What changed: See the new instructions.   Spiriva Respimat 2.5 MCG/ACT Aers Generic drug: Tiotropium Bromide Monohydrate INHALE 2 PUFFS INTO THE LUNGS DAILY   spironolactone 50 MG tablet Commonly known as: ALDACTONE TAKE 1 TABLET(50 MG) BY MOUTH DAILY What changed: See the new instructions.   Symbicort 160-4.5 MCG/ACT inhaler Generic drug: budesonide-formoterol INHALE 2 PUFFS INTO THE LUNGS TWICE DAILY   tiZANidine 4 MG tablet Commonly known as: Zanaflex Take 1 tablet (4 mg total) by mouth every 6 (six) hours as needed for muscle spasms.   Toujeo Max SoloStar 300 UNIT/ML Solostar Pen Generic drug: insulin glargine (2 Unit Dial) Inject 60 Units into the skin in the morning.        Follow-up Information     Ivonne Andrew, NP Follow up in 1 week(s).   Specialties: Pulmonary Disease, Endocrinology Contact information: [address] [place] [address] [phone]                Allergies  Allergen Reactions   Elavil [Amitriptyline] Other (See Comments)    Coma   Orudis [Ketoprofen] Nausea And Vomiting   Desyrel [Trazodone] Nausea And Vomiting   Aspirin Nausea Only   Motrin [Ibuprofen] Nausea  And Vomiting   Naprosyn [Naproxen] Nausea And Vomiting   Neurontin [Gabapentin] Nausea And Vomiting and Other (See Comments)    upset stomach   Sulfa Antibiotics Nausea And Vomiting   Ultram [Tramadol] Nausea And Vomiting and Other (See Comments)    stomach upset   Victoza [Liraglutide] Nausea And Vomiting   Zegerid [Omeprazole-Sodium Bicarbonate] Nausea And Vomiting    The results of significant diagnostics from this hospitalization (including imaging, microbiology, ancillary and laboratory) are listed below for reference.    Microbiology: Recent Results (from the past 240 hour(s))  Blood culture (routine x 2)     Status: None (Preliminary result)   Collection Time: 03/18/23  8:40 PM   Specimen: BLOOD RIGHT WRIST  Result Value Ref Range Status   Specimen Description BLOOD RIGHT WRIST  Final   Special Requests   Final    BOTTLES DRAWN AEROBIC AND ANAEROBIC Blood Culture adequate volume   Culture   Final    NO GROWTH 2 DAYS Performed at [facility], [address], [place], [place] [address]    Report Status PENDING  Incomplete  Blood culture (routine x 2)     Status: None (Preliminary result)   Collection Time: 03/18/23  9:50 PM   Specimen: BLOOD RIGHT ARM  Result Value Ref Range Status   Specimen Description BLOOD RIGHT ARM  Final   Special Requests   Final    BOTTLES DRAWN AEROBIC AND ANAEROBIC Blood Culture adequate volume   Culture   Final    NO GROWTH 2 DAYS Performed at [facility], [address], [place], [place] [address]    Report Status PENDING  Incomplete    Procedures/Studies: DG Chest 2 View  Result Date: 03/18/2023 CLINICAL DATA:  Chest pain EXAM: CHEST - 2 VIEW COMPARISON:  December 18, 2022 FINDINGS: Stable cardiomegaly. The hila and mediastinum are normal. No pneumothorax. No nodules or masses. No focal infiltrates. IMPRESSION: No active cardiopulmonary disease. Electronically Signed   By: Gerome Sam III M.D.   On: 03/18/2023 20:03   CT  ABDOMEN PELVIS W CONTRAST  Result Date: 03/18/2023 CLINICAL DATA:  Pain right lower quadrant of abdomen EXAM: CT ABDOMEN AND PELVIS  WITH CONTRAST TECHNIQUE: Multidetector CT imaging of the abdomen and pelvis was performed using the standard protocol following bolus administration of intravenous contrast. RADIATION DOSE REDUCTION: This exam was performed according to the departmental dose-optimization program which includes automated exposure control, adjustment of the mA and/or kV according to patient size and/or use of iterative reconstruction technique. CONTRAST:  75mL OMNIPAQUE IOHEXOL 350 MG/ML SOLN COMPARISON:  02/18/2023 FINDINGS: Lower chest: Minimal pericardial effusion is seen. Small linear densities in the lower lung fields may suggest minimal scarring. Hepatobiliary: No focal abnormalities are seen in the liver. There is no dilation of bile ducts. Gallbladder is unremarkable. Pancreas: Pancreas is smaller than usual incised. No focal abnormalities are seen. Spleen: Unremarkable. Adrenals/Urinary Tract: Adrenals are unremarkable. There is no hydronephrosis. There are no renal or ureteral stones. Urinary bladder is unremarkable. Stomach/Bowel: Small hiatal hernia is seen. Stomach is unremarkable. Small bowel loops are not dilated. Appendix is of normal caliber. There is no significant wall thickening in colon. There is no pericolic stranding. Vascular/Lymphatic: Vascular structures are unremarkable. There are subcentimeter nodes in mesentery and adjacent to put a bodies, possibly suggesting reactive hyperplasia of nodes. Reproductive: Uterus is enlarged in size. No dominant adnexal masses are seen. There is possible small calcification in the right adnexa. Other: There is no ascites or pneumoperitoneum. Small umbilical hernia containing fat is seen. Musculoskeletal: No acute findings are seen. IMPRESSION: There is no evidence of intestinal obstruction or pneumoperitoneum. There is no hydronephrosis.  Appendix is not dilated. Minimal pericardial effusion.  Small hiatal hernia. Electronically Signed   By: Ernie Avena M.D.   On: 03/18/2023 18:59   US PELVIC COMPLETE WITH TRANSVAGINAL  Result Date: 03/10/2023 CLINICAL DATA:  Abnormal year bleeding EXAM: TRANSABDOMINAL AND TRANSVAGINAL ULTRASOUND OF PELVIS TECHNIQUE: Both transabdominal and transvaginal ultrasound examinations of the pelvis were performed. Transabdominal technique was performed for global imaging of the pelvis including uterus, ovaries, adnexal regions, and pelvic cul-de-sac. It was necessary to proceed with endovaginal exam following the transabdominal exam to visualize the endometrium and ovaries. COMPARISON:  None Available. FINDINGS: Uterus Measurements: 13.9 x 7.2 x 8.6 cm = volume: 456 mL. No fibroids or other mass visualized. Endometrium Thickness: 8 mm.  No focal abnormality visualized. Right ovary Not visualized. Left ovary Poorly visualized.  Left ovary is not grossly enlarged in size. Other findings No abnormal free fluid. IMPRESSION: 1. Study is very limited due to patient body habitus and patient's inability to tolerate complete endovaginal imaging. 2. If bleeding remains unresponsive to hormonal or medical therapy, sonohysterogram should be considered for focal lesion work-up. (Ref: Radiological Reasoning: Algorithmic Workup of Abnormal Vaginal Bleeding with Endovaginal Sonography and Sonohysterography. AJR 2008; 811:B14-78) 3. A small amount of fluid in the endometrial canal may represent blood products given history. 4. Questionable mass/fibroid in the uterus, poorly evaluated. Electronically Signed   By: Gerome Sam III M.D.   On: 03/10/2023 13:49    Labs: BNP (last 3 results) Recent Labs    09/18/22 1630  BNP 129.3*   Basic Metabolic Panel: Recent Labs  Lab 03/18/23 1741 03/18/23 1835 03/19/23 0303 03/20/23 0419 03/21/23 0345  NA 134* 137  135 135 139 136  K 3.4* 3.1*  3.1* 3.3* 4.4 4.2  CL 96*  100 102 106 103  CO2 19*  --  22 24 22   GLUCOSE 282* 187* 88 259* 231*  BUN 22* 25* 18 10 11   CREATININE 2.94* 3.00* 1.99* 1.19* 1.18*  CALCIUM 10.7*  --  8.8* 8.7* 9.0  MG  --   --  2.3  --   --    Liver Function Tests: Recent Labs  Lab 03/18/23 1741  AST 25  ALT 11  ALKPHOS 86  BILITOT 1.0  PROT 7.8  ALBUMIN 4.4   Recent Labs  Lab 03/18/23 1741  LIPASE 21   No results for input(s): "AMMONIA" in the last 168 hours. CBC: Recent Labs  Lab 03/18/23 1741 03/18/23 1835 03/19/23 0303 03/20/23 0419 03/21/23 0345  WBC 13.4*  --  11.1* 8.4 9.6  HGB 16.2* 17.7*  17.3* 13.5 13.1 12.2  HCT 46.8* 52.0*  51.0* 39.5 38.8 36.2  MCV 77.2*  --  80.0 81.2 82.3  PLT 247  --  179 162 148*   Cardiac Enzymes: No results for input(s): "CKTOTAL", "CKMB", "CKMBINDEX", "TROPONINI" in the last 168 hours. BNP: Invalid input(s): "POCBNP" CBG: Recent Labs  Lab 03/20/23 1143 03/20/23 1605 03/20/23 2006 03/20/23 2320 03/21/23 0355  GLUCAP 282* 248* 266* 213* 250*   D-Dimer No results for input(s): "DDIMER" in the last 72 hours. Hgb A1c No results for input(s): "HGBA1C" in the last 72 hours. Lipid Profile No results for input(s): "CHOL", "HDL", "LDLCALC", "TRIG", "CHOLHDL", "LDLDIRECT" in the last 72 hours. Thyroid function studies No results for input(s): "TSH", "T4TOTAL", "T3FREE", "THYROIDAB" in the last 72 hours.  Invalid input(s): "FREET3" Anemia work up No results for input(s): "VITAMINB12", "FOLATE", "FERRITIN", "TIBC", "IRON", "RETICCTPCT" in the last 72 hours. Urinalysis    Component Value Date/Time   COLORURINE YELLOW 03/20/2023 1030   APPEARANCEUR HAZY (A) 03/20/2023 1030   LABSPEC 1.010 03/20/2023 1030   PHURINE 5.0 03/20/2023 1030   GLUCOSEU >=500 (A) 03/20/2023 1030   HGBUR LARGE (A) 03/20/2023 1030   BILIRUBINUR NEGATIVE 03/20/2023 1030   BILIRUBINUR negative 12/29/2021 1205   BILIRUBINUR NEGATIVE 11/05/2019 1342   KETONESUR NEGATIVE 03/20/2023 1030    PROTEINUR NEGATIVE 03/20/2023 1030   UROBILINOGEN 1.0 12/29/2021 1205   UROBILINOGEN 0.2 11/12/2020 1148   NITRITE NEGATIVE 03/20/2023 1030   LEUKOCYTESUR MODERATE (A) 03/20/2023 1030   Sepsis Labs Recent Labs  Lab 03/18/23 1741 03/19/23 0303 03/20/23 0419 03/21/23 0345  WBC 13.4* 11.1* 8.4 9.6   Microbiology Recent Results (from the past 240 hour(s))  Blood culture (routine x 2)     Status: None (Preliminary result)   Collection Time: 03/18/23  8:40 PM   Specimen: BLOOD RIGHT WRIST  Result Value Ref Range Status   Specimen Description BLOOD RIGHT WRIST  Final   Special Requests   Final    BOTTLES DRAWN AEROBIC AND ANAEROBIC Blood Culture adequate volume   Culture   Final    NO GROWTH 2 DAYS Performed at [facility], [address] [place], [place] [address]    Report Status PENDING  Incomplete  Blood culture (routine x 2)     Status: None (Preliminary result)   Collection Time: 03/18/23  9:50 PM   Specimen: BLOOD RIGHT ARM  Result Value Ref Range Status   Specimen Description BLOOD RIGHT ARM  Final   Special Requests   Final    BOTTLES DRAWN AEROBIC AND ANAEROBIC Blood Culture adequate volume   Culture   Final    NO GROWTH 2 DAYS Performed at [facility], [address], [place], [place] [address]    Report Status PENDING  Incomplete   Time coordinating discharge: 25 minutes  SIGNED: Lanae Boast, MD  [facility] 03/21/2023, 10:36 AM  If 7PM-7AM, please contact night-coverage [email or web address]

## 2023-03-21 NOTE — Care Management Important Message (Signed)
Important Message  Patient Details  Name: Jill Shaw MRN: [id number] Date of Birth: 30-Dec-1975   Medicare Important Message Given:  Yes     Renie Ora 03/21/2023, 9:04 AM

## 2023-03-21 NOTE — Telephone Encounter (Signed)
Pt seen in office today.

## 2023-03-21 NOTE — Inpatient Diabetes Management (Signed)
Inpatient Diabetes Program Recommendations  AACE/ADA: New Consensus Statement on Inpatient Glycemic Control (2015)  Target Ranges:  Prepandial:   less than 140 mg/dL      Peak postprandial:   less than 180 mg/dL (1-2 hours)      Critically ill patients:  140 - 180 mg/dL   Lab Results  Component Value Date   GLUCAP 250 (H) 03/21/2023   HGBA1C 7.0 (A) 01/04/2023    Review of Glycemic Control  Latest Reference Range & Units 03/20/23 07:52 03/20/23 11:43 03/20/23 16:05 03/20/23 20:06 03/20/23 23:20 03/21/23 03:55  Glucose-Capillary 70 - 99 mg/dL 161 (H) 096 (H) 045 (H) 266 (H) 213 (H) 250 (H)   Diabetes history: DM 2 Outpatient Diabetes medications: Humalog 50 units tid, Toujeo 60 units qam Current orders for Inpatient glycemic control:  Semglee 25 units Daily Novolog 0-9 units Q4 hours Novolog 8 units tid meal coverage  A1c 7% on 3/21  Inpatient Diabetes Program Recommendations:    -  Increase Semglee to 35 units -  Increase Novolog meal coverage to 10 units tid  Thanks,  Christena Deem RN, MSN, BC-ADM Inpatient Diabetes Coordinator Team Pager [phone] (8a-5p)

## 2023-03-21 NOTE — Telephone Encounter (Signed)
Patient calling informing office that she will be being discharged from hospital today. Message states her doctor in hospital wanted to be sure we are going to be prescribing her pain medication. Patient currently in-patient. Patient was made aware no medications could be sent until after she has been discharged.

## 2023-03-21 NOTE — Progress Notes (Signed)
Subjective:    Patient ID: Jill Shaw, female    DOB: 16-Aug-1976, 47 y.o.   MRN: [id number]  HPI: Jill Shaw is a 47 y.o. female who returns for follow up appointment for chronic pain and medication refill. She states her pain is located in her bilateral shoulders, lower back pain radiating into her bilateral lower extremities. She also reports bilateral hands and bilateral feet with tingling and burning. She rates her pain 9. Her current exercise regime is walking and performing stretching exercises.  Ms. Basom was discharged from [facility] on 03/21/2023, she was admitted with Acute Renal Failure, she also was noted with Prolong QT level.Medication list was reviewed, Tizanidine was discontinued. This provider also spoke with pharmacist and we reviewed all muscle relaxers, they all have the possibility of prolonging QT Levels. Ms. Miler was instructed to F/U with her Cardiologist, she states she will schedule an appointment with her cardiologist. She verbalizes understanding not to take Tizanidine at this time. We will await cardiology input.   Discharged summary was reviewed.   Ms. Wheatcraft Morphine equivalent is 75.00 MME.  Last UDS was Performed on 12/25/2022, it was consistent.      Pain Inventory Average Pain 10 Pain Right Now 9 My pain is constant, sharp, burning, dull, stabbing, tingling, and aching  In the last 24 hours, has pain interfered with the following? General activity 10 Relation with others 8 Enjoyment of life 9 What TIME of day is your pain at its worst? morning , daytime, evening, and night Sleep (in general) Poor  Pain is worse with: walking, bending, sitting, inactivity, standing, unsure, and some activites Pain improves with: heat/ice and medication Relief from Meds:  not taking pain medication  Family History  Problem Relation Age of Onset   Diabetes Mother    Hypertension Mother    Migraines Paternal Grandfather    Colon cancer Neg Hx     Esophageal cancer Neg Hx    Stomach cancer Neg Hx    Rectal cancer Neg Hx    Social History   Socioeconomic History   Marital status: Divorced    Spouse name: Not on file   Number of children: 3   Years of education: Not on file   Highest education level: Not on file  Occupational History   Occupation: unemployed  Tobacco Use   Smoking status: Some Days    Packs/day: 0.25    Years: 26.00    Additional pack years: 0.00    Total pack years: 6.50    Types: Cigarettes    Last attempt to quit: 09/13/2020    Years since quitting: 2.5   Smokeless tobacco: Never   Tobacco comments:    4-5 cigarettes/day  Vaping Use   Vaping Use: Never used  Substance and Sexual Activity   Alcohol use: No   Drug use: No   Sexual activity: Not Currently    Birth control/protection: None  Other Topics Concern   Not on file  Social History Narrative   Right Handed   Lives in a one story apartment, but lives on the second floor   Drinks caffeine once in awhile   Social Determinants of Health   Financial Resource Strain: Low Risk  (03/18/2023)   Overall Financial Resource Strain (CARDIA)    Difficulty of Paying Living Expenses: Not very hard  Food Insecurity: No Food Insecurity (03/18/2023)   Hunger Vital Sign    Worried About Running Out of Food in the Last Year:  Never true    Ran Out of Food in the Last Year: Never true  Transportation Needs: No Transportation Needs (03/19/2023)   PRAPARE - Administrator, Civil Service (Medical): No    Lack of Transportation (Non-Medical): No  Physical Activity: Inactive (03/18/2023)   Exercise Vital Sign    Days of Exercise per Week: 0 days    Minutes of Exercise per Session: 0 min  Stress: Stress Concern Present (03/18/2023)   [employer] of Occupational Health - Occupational Stress Questionnaire    Feeling of Stress : To some extent  Social Connections: Moderately Isolated (03/18/2023)   Social Connection and Isolation Panel [NHANES]     Frequency of Communication with Friends and Family: More than three times a week    Frequency of Social Gatherings with Friends and Family: Three times a week    Attends Religious Services: 1 to 4 times per year    Active Member of Clubs or Organizations: No    Attends Banker Meetings: Never    Marital Status: Divorced   Past Surgical History:  Procedure Laterality Date   CESAREAN SECTION     x 1. for twins   DILATION AND CURETTAGE OF UTERUS N/A 08/20/2019   Procedure: DILATATION AND CURETTAGE;  Surgeon: Allie Bossier, MD;  Location: [facility] OR;  Service: Gynecology;  Laterality: N/A;   ENDOMETRIAL ABLATION N/A 08/20/2019   Procedure: Minerva Ablation;  Surgeon: Allie Bossier, MD;  Location: [facility] OR;  Service: Gynecology;  Laterality: N/A;   EYE SURGERY Bilateral    laser right and cataract removed left eye   LEFT HEART CATH AND CORONARY ANGIOGRAPHY N/A 08/30/2021   Procedure: LEFT HEART CATH AND CORONARY ANGIOGRAPHY;  Surgeon: Elder Negus, MD;  Location: [facility] INVASIVE [facility];  Service: Cardiovascular;  Laterality: N/A;   RADIOLOGY WITH ANESTHESIA N/A 09/16/2019   Procedure: MRI WITH ANESTHESIA   L SPINE WITHOUT CONTRAST, T SPINE WITHOUT CONTRAST , CERVICAL WITHOUT CONTRAST;  Surgeon: Radiologist, Medication, MD;  Location: [facility] OR;  Service: Radiology;  Laterality: N/A;   TUBAL LIGATION     interval BTL   UPPER GI ENDOSCOPY  07/2017   Past Surgical History:  Procedure Laterality Date   CESAREAN SECTION     x 1. for twins   DILATION AND CURETTAGE OF UTERUS N/A 08/20/2019   Procedure: DILATATION AND CURETTAGE;  Surgeon: Allie Bossier, MD;  Location: [facility];  Service: Gynecology;  Laterality: N/A;   ENDOMETRIAL ABLATION N/A 08/20/2019   Procedure: Minerva Ablation;  Surgeon: Allie Bossier, MD;  Location: [facility];  Service: Gynecology;  Laterality: N/A;   EYE SURGERY Bilateral    laser right and cataract removed left eye   LEFT HEART CATH AND CORONARY ANGIOGRAPHY N/A 08/30/2021    Procedure: LEFT HEART CATH AND CORONARY ANGIOGRAPHY;  Surgeon: Elder Negus, MD;  Location: [facility];  Service: Cardiovascular;  Laterality: N/A;   RADIOLOGY WITH ANESTHESIA N/A 09/16/2019   Procedure: MRI WITH ANESTHESIA   L SPINE WITHOUT CONTRAST, T SPINE WITHOUT CONTRAST , CERVICAL WITHOUT CONTRAST;  Surgeon: Radiologist, Medication, MD;  Location: [facility];  Service: Radiology;  Laterality: N/A;   TUBAL LIGATION     interval BTL   UPPER GI ENDOSCOPY  07/2017   Past Medical History:  Diagnosis Date   Anemia    Anxiety    Arthritis    knees, hands   Asthma    Chronic diastolic (congestive) heart failure (HCC)  COPD (chronic obstructive pulmonary disease) (HCC)    Diabetes mellitus (HCC)    DKA (diabetic ketoacidosis) (HCC)    Dysfunctional uterine bleeding    Gastritis    GERD (gastroesophageal reflux disease)    Hypertension    Intractable nausea and vomiting 03/23/2021   Neuromuscular disorder (HCC)    neuropathy feet   Seizures (HCC) 09/12/2017   pt states r/t stress and blood sugar - no meds last one 4 months ago, not seen neurologist   Sickle cell trait (HCC)    Smoker    Vitamin D deficiency 10/2019   Wears glasses    BP (!) 151/80   Pulse 80   Ht 5\' 4"  (1.626 m)   Wt 271 lb (122.9 kg)   SpO2 98%   BMI 46.52 kg/m   Opioid Risk Score:   Fall Risk Score:  `1  Depression screen PHQ 2/9     03/21/2023    1:34 PM 02/19/2023   10:00 AM 01/25/2023   10:52 AM 01/04/2023    3:22 PM 12/25/2022   10:18 AM 10/24/2022   10:03 AM 09/05/2022    2:45 PM  Depression screen PHQ 2/9  Decreased Interest 1 0 0 0 2 0 0  Down, Depressed, Hopeless 1 0 0 0 2 0 0  PHQ - 2 Score 2 0 0 0 4 0 0  Altered sleeping    3   2  Tired, decreased energy    3   2  Change in appetite       0  Feeling bad or failure about yourself        0  Trouble concentrating       0  Moving slowly or fidgety/restless       0  Suicidal thoughts       0  PHQ-9 Score    6   4    Review of  Systems  Gastrointestinal:  Positive for abdominal pain.  Musculoskeletal:  Positive for back pain.       Pain in both feet, pain both hands, pelvic pain, right shoulder pain  All other systems reviewed and are negative.      Objective:   Physical Exam Vitals and nursing note reviewed.  Constitutional:      Appearance: Normal appearance.  Cardiovascular:     Rate and Rhythm: Normal rate and regular rhythm.     Pulses: Normal pulses.     Heart sounds: Normal heart sounds.  Pulmonary:     Effort: Pulmonary effort is normal.     Breath sounds: Normal breath sounds.  Musculoskeletal:     Cervical back: Normal range of motion and neck supple.     Comments: Normal Muscle Bulk and Muscle Testing Reveals:  Upper Extremities: Full ROM and Muscle Strength 5/5 Bilateral AC Joint Tenderness Lumbar Hypersensitivity Lower Extremities: Right Lower Extremity: Decreased ROM and Muscle Strength 5/5 Right Lower Extremity Flexion Produces Pain into her Right Patella Left Lower Extremity: Full ROM and Muscle Strength 5/5 Arises from Table slowly Narrow Based  Gait     Skin:    General: Skin is warm and dry.  Neurological:     Mental Status: She is alert and oriented to person, place, and time.  Psychiatric:        Mood and Affect: Mood normal.        Behavior: Behavior normal.         Assessment & Plan:  1. Lumbar Radiculitis: Continue current medication regimen.  Continue HEP as Tolerated.03/21/2023 2. Fibromyalgia/Polyneuropathy: Continue HEP as Tolerated. Continue current Medication regimen. Continue to Monitor. 03/21/2023 3. Bilateral Knee Pain: R>L. Continue HEP as tolerated. Continue to Monitor.06/05//2024 4. Chronic Pain Syndrome:Continue slow weaning: Refilled:  Oxycodone 10/325mg  one tablet 5 times  a day as needed for pain #140.  We will continue the opioid monitoring program, this consists of regular clinic visits, examinations, urine drug screen, pill counts as well as use of  [place] Controlled Substance Reporting system. A 12 month History has been reviewed on the [place] Controlled Substance Reporting System on 06/052024.  5.  Muscle Spasm:Discontinue Tizanidine: Ms. Agredano was admitted to [facility] on 03/18/2023 - 03/20/2024, discharged summary was reviewed. Prolong QT was noted: Tizanidine was discontinued. This was discussed with pharmacy and Dr Carlis Abbott.  Continue to Monitor . 03/21/2023 5. Chronic Bilateral Shoulder Pain: Continue HEP as Tolerated. Continue to Monitor. 03/21/2023    F/U in 1 month

## 2023-03-21 NOTE — Patient Instructions (Addendum)
Scheduled an appointment with Your Cardiologist to discuss Prolong QT level: While Hospitalized on 03/18/2023- 03/21/2023  Do not take the Tizanidine at this time.   We will await your Cardiologist decision regarding muscle relaxer.

## 2023-03-22 ENCOUNTER — Other Ambulatory Visit: Payer: Self-pay

## 2023-03-22 LAB — GLUCOSE, CAPILLARY: Glucose-Capillary: 266 mg/dL — ABNORMAL HIGH (ref 70–99)

## 2023-03-22 LAB — CULTURE, BLOOD (ROUTINE X 2): Culture: NO GROWTH

## 2023-03-23 ENCOUNTER — Telehealth: Payer: Self-pay

## 2023-03-23 ENCOUNTER — Other Ambulatory Visit: Payer: Self-pay | Admitting: Obstetrics and Gynecology

## 2023-03-23 ENCOUNTER — Other Ambulatory Visit: Payer: Self-pay

## 2023-03-23 DIAGNOSIS — N939 Abnormal uterine and vaginal bleeding, unspecified: Secondary | ICD-10-CM

## 2023-03-23 LAB — CULTURE, BLOOD (ROUTINE X 2)
Culture: NO GROWTH
Special Requests: ADEQUATE

## 2023-03-23 MED ORDER — MEGESTROL ACETATE 40 MG PO TABS
80.0000 mg | ORAL_TABLET | Freq: Two times a day (BID) | ORAL | 0 refills | Status: DC
Start: 2023-03-23 — End: 2023-04-03
  Filled 2023-03-23: qty 56, 14d supply, fill #0

## 2023-03-23 NOTE — Telephone Encounter (Signed)
Patient called nurse line requesting to speak with nurse in regards to "prescribing a medication" for her.   Called patient at [phone] and verified with name and DOB. Patient reports that she was discharged from hospital today--per chart note, d/t acute renal failure. Patient was prescribed pain medication for original complaint upon discharge and was informed by hospital staff that she would not have any refills for any discharge mediations until she speaks with her PCP; repeated this information to patient. Patient had mentioned that she has a pain medication doctor but that she is in the process of separating from them due to weaning her off of medications.   Patient requesting that we send in Rx for vaginal pain and Rx for "medication that stops bleeding" (megestrol), as her bleeding has begun again.   Per provider recommendation, patient has appointment on 6/18 with Dr. Briscoe Deutscher for EMB procedure. Emphasized that it is important that she keep her appointment for that day.   Informed patient that I would send this to Dr. Briscoe Deutscher and we would see what we can do in regards to prescribing any pain medications and any medications to help with bleeding until her next appointment (since per the chart, Dr. Briscoe Deutscher and the patient seemed to discuss if these pills were still effective for controlling bleeding or not). Patient verbalized understanding and had no further questions.   Maureen Ralphs RN on 03/23/23 at 1027

## 2023-03-26 ENCOUNTER — Other Ambulatory Visit: Payer: Self-pay | Admitting: Nurse Practitioner

## 2023-03-26 ENCOUNTER — Encounter: Payer: Medicaid Other | Admitting: Cardiology

## 2023-03-26 ENCOUNTER — Other Ambulatory Visit: Payer: Self-pay

## 2023-03-26 NOTE — Progress Notes (Deleted)
Patient referred by Ivonne Andrew, NP for congestive heart failure  Subjective:   Jill Shaw, female    DOB: Nov 24, 1975, 47 y.o.   MRN: [id number]   Chief Complaint  Patient presents with   Chronic diastolic CHF   Hospitalization Follow-up     HPI  47 year old African-American female with hypertension, type 2 diabetes mellitus, HFpEF, moderate persistent asthma, tobacco dependence, morbid obesity, microcytic anemia, uterine dysfunction  Coronary angiogram showed no angiographically evident coronary artery disease. She reports palpitations on walking. She is very concerned about heart rate increasing on walking.     Current Outpatient Medications:    Accu-Chek Softclix Lancets lancets, USE TO TEST AS DIRECTED UP TO FOUR TIMES DAILY., Disp: 100 each, Rfl: 0   albuterol (PROVENTIL) (2.5 MG/3ML) 0.083% nebulizer solution, Inhale into the lungs., Disp: , Rfl:    albuterol (VENTOLIN HFA) 108 (90 Base) MCG/ACT inhaler, Inhale 2 puffs into the lungs every 6 (six) hours as needed for wheezing or shortness of breath., Disp: 8 g, Rfl: 6   B-D ULTRAFINE III SHORT PEN 31G X 8 MM MISC, USE AS DIRECTED EVERY MORNING, NOON, EVENING, AND AT BEDTIME, Disp: 400 each, Rfl: 0   Continuous Blood Gluc Sensor (FREESTYLE LIBRE 3 SENSOR) MISC, Place 1 sensor on the skin every 14 days. Use to check glucose continuously, Disp: 6 each, Rfl: 3   Dexlansoprazole 30 MG capsule DR, TAKE 1 CAPSULE (30 MG TOTAL) BY MOUTH DAILY. NEEDS OFFICE VISIT FOR ADDITIONAL REFILLS, Disp: 60 capsule, Rfl: 1   dicyclomine (BENTYL) 20 MG tablet, Take 1 tablet (20 mg total) by mouth 2 (two) times daily., Disp: 60 tablet, Rfl: 3   furosemide (LASIX) 40 MG tablet, TAKE 1 TABLET(40 MG) BY MOUTH TWICE DAILY (Patient taking differently: Take 40 mg by mouth 2 (two) times daily.), Disp: 180 tablet, Rfl: 3   glucose blood (ACCU-CHEK GUIDE) test strip, USE TO TEST BLOOD GLUCOSE AS DIRECTED UP TO 4 TIMES DAILY, Disp: 100 strip, Rfl:  4   HUMALOG KWIKPEN 200 UNIT/ML KwikPen, Inject 50 Units into the skin 3 (three) times daily before meals., Disp: 60 mL, Rfl: 0   hydrochlorothiazide (MICROZIDE) 12.5 MG capsule, TAKE 1 CAPSULE(12.5 MG) BY MOUTH DAILY (Patient taking differently: Take 12.5 mg by mouth daily.), Disp: 90 capsule, Rfl: 3   losartan (COZAAR) 100 MG tablet, Take 1 tablet (100 mg total) by mouth daily., Disp: 90 tablet, Rfl: 0   megestrol (MEGACE) 40 MG tablet, Take 2 tablets (80 mg total) by mouth 2 (two) times daily for 14 days. Can increase to two tablets twice a day in the event of heavy bleeding, Disp: 56 tablet, Rfl: 0   metoCLOPramide (REGLAN) 10 MG tablet, Take 1 tablet (10 mg total) by mouth every 6 (six) hours as needed for nausea., Disp: 30 tablet, Rfl: 0   metoprolol succinate (TOPROL-XL) 100 MG 24 hr tablet, Take 100 mg by mouth daily. Take with or immediately following a meal., Disp: , Rfl:    metroNIDAZOLE (FLAGYL) 500 MG tablet, Take 1 tablet (500 mg total) by mouth every 12 (twelve) hours for 5 days., Disp: 10 tablet, Rfl: 0   ondansetron (ZOFRAN) 4 MG tablet, Take 1 tablet (4 mg total) by mouth every 8 (eight) hours as needed for nausea or vomiting., Disp: 20 tablet, Rfl: 0   oxyCODONE-acetaminophen (PERCOCET) 10-325 MG tablet, Take 1 tablet by mouth 5 (five) times daily as needed for pain., Disp: 140 tablet, Rfl: 0   Pimozide  1 MG TABS, Take by mouth., Disp: , Rfl:    potassium chloride SA (KLOR-CON M) 20 MEQ tablet, Take 1 tablet (20 mEq total) by mouth daily., Disp: 30 tablet, Rfl: 0   rosuvastatin (CRESTOR) 20 MG tablet, TAKE 1 TABLET(20 MG) BY MOUTH DAILY (Patient taking differently: Take 20 mg by mouth daily.), Disp: 90 tablet, Rfl: 3   SPIRIVA RESPIMAT 2.5 MCG/ACT AERS, INHALE 2 PUFFS INTO THE LUNGS DAILY, Disp: 4 g, Rfl: 6   spironolactone (ALDACTONE) 50 MG tablet, TAKE 1 TABLET(50 MG) BY MOUTH DAILY (Patient taking differently: Take 50 mg by mouth once.), Disp: 30 tablet, Rfl: 3   SYMBICORT  160-4.5 MCG/ACT inhaler, INHALE 2 PUFFS INTO THE LUNGS TWICE DAILY, Disp: 10.2 g, Rfl: 0   TOUJEO MAX SOLOSTAR 300 UNIT/ML Solostar Pen, Inject 60 Units into the skin in the morning., Disp: , Rfl:     Cardiovascular and other pertinent studies:  Coronary angiogram 08/30/2021: LM: Normal LAD: Minimal luminal irregularities Lcx: Normal RCA: Normal Normal flow in all coronary beds   Normal LVEDP   Consider alternate cause for chest pain Recommend aggressive blood pressure control  Echocardiogram 06/01/2021:  Left ventricle cavity is normal in size. Mild concentric hypertrophy of  the left ventricle. Hyperdynamic LV systolic function >70%. Patient is  tachycardic throughout the day. Indeterminate diastolic filling pattern.  No significant valvular abnormality.  Normal right atrial pressure.  Lexiscan/midified Bruce Tetrofosmin stress test 05/23/2021: Lexiscan/modified Bruce nuclear stress test performed using 1-day protocol. SPECT images show very small sized, mild intensity, inferior apical myocardium with mild reversibility. Stress LVEF calculated 40-45%, although visually appears 50-55%. Low risk study.  EKG 10/14/2020: Sinus tachycardia 111 bpm  Cannot exclude old anteroseptal infarct  EKG 09/01/2020: Sinus rhythm 85 bpm  Poor R wave progression Otherwise normal EKG  Vascular US 07/16/2020: No DVT  Echocardiogram 04/30/2020: 1. Left ventricular ejection fraction, by estimation, is 55 to 60%. The  left ventricle has normal function. The left ventricle has no regional  wall motion abnormalities. There is moderate concentric left ventricular  hypertrophy. Left ventricular  diastolic parameters are consistent with Grade I diastolic dysfunction  (impaired relaxation). Elevated left ventricular end-diastolic pressure.   2. Right ventricular systolic function is normal. The right ventricular  size is normal.   3. The mitral valve is normal in structure. Trivial mitral valve   regurgitation. No evidence of mitral stenosis.   4. The aortic valve is normal in structure. Aortic valve regurgitation is  not visualized. No aortic stenosis is present.   5. The inferior vena cava is dilated in size with <50% respiratory  variability, suggesting right atrial pressure of 15 mmHg.    Recent labs: 03/21/2023: Glucose 231, BUN/Cr 11/1.18. EGFR 58. Na/K 136/4.2.  H/H 12/36. MCV 82. Platelets 148 HbA1C 7.0%  11/2021; Chol 153, TG 144, HDL 54, LDL 74  07/26/2020: Glucose 160, BUN/Cr 10/1.13. EGFR 60. Na/K 140/4.0. Rest of the CMP normal H/H 9.5/34.7. MCV 70. Platelets 311 HbA1C 9.2% Chol 135, TG 166, HDL 29, LDL 77   Review of Systems  Cardiovascular: Positive for chest pain, dyspnea on exertion, leg swelling and orthopnea. Negative for palpitations and syncope.         There were no vitals filed for this visit.   There is no height or weight on file to calculate BMI. There were no vitals filed for this visit.  Review of Systems  Cardiovascular:  Positive for chest pain and dyspnea on exertion. Negative for leg swelling,  palpitations and syncope.  Psychiatric/Behavioral:         Tearful     Objective:   Physical Exam Physical Exam Vitals and nursing note reviewed.  Constitutional:      General: She is not in acute distress.    Appearance: She is obese.  Neck:     Vascular: No JVD.  Cardiovascular:     Rate and Rhythm: Normal rate and regular rhythm.     Heart sounds: Normal heart sounds. No murmur heard. Pulmonary:     Effort: Pulmonary effort is normal.     Breath sounds: Normal breath sounds. No wheezing or rales.  Musculoskeletal:     Right lower leg: No edema.     Left lower leg: No edema.           Assessment & Recommendations:   47 year old African-American female with hypertension, type 2 diabetes mellitus, HFpEF, moderate persistent asthma, tobacco dependence, morbid obesity, microcytic anemia, uterine  dysfunction  Palpitations: Most likely physiological. Will check cardiac telemetry to rule out any significant arrhtymia  Hypertension: Uncontrolled. She has close f/u w/PCP. Recommend regular exercise, weight loss.. If no improvement, consider adding amlodipine 5 mg daily.   Normal coronaries. Can stop Aspirin.   F/u in 6 months. If BP stable, I will see her on as needed basis   Elder Negus, MD Pager: [phone] Office: [phone]

## 2023-03-30 ENCOUNTER — Telehealth: Payer: Self-pay | Admitting: Registered Nurse

## 2023-03-30 ENCOUNTER — Other Ambulatory Visit: Payer: Self-pay

## 2023-03-30 ENCOUNTER — Telehealth: Payer: Self-pay | Admitting: *Deleted

## 2023-03-30 NOTE — Telephone Encounter (Signed)
Pt left distressed voicemail message yesterday (not heard until today) stating that she is having a lot of pain in her abdomen and vagina. She is also passing blood clots. Pt stating that she does not feel she can wait for the procedure and wants to see if it can be moved up. Pt requested to speak with the doctor.  1440  Pt left second voicemail message stating that her bleeding is very heavy ("the commode is full") and passing clots the size of 50 cent piece. She also stated that her pain is severe.   6/14 4401  Returned call to pt and she did not answer. I left voicemail message stating that I am following up on her condition today and wanted to know her pain level, how often she is taking pain medication and current amount of bleeding. I also stated that if she is concerned that her bleeding is excessive, she should go to ED for evaluation. Pt may call back with additional information or questions.

## 2023-03-30 NOTE — Telephone Encounter (Signed)
She left a voicemail stating she has questions that she needs to ask eunice

## 2023-04-02 NOTE — Telephone Encounter (Signed)
Return Ms. Delaughter call. No answer.  Left message to return the call.

## 2023-04-03 ENCOUNTER — Other Ambulatory Visit (HOSPITAL_COMMUNITY)
Admission: RE | Admit: 2023-04-03 | Discharge: 2023-04-03 | Disposition: A | Discharge status: Home / Self Care | Payer: Medicare Other | Source: Ambulatory Visit | Attending: Obstetrics and Gynecology | Admitting: Obstetrics and Gynecology

## 2023-04-03 ENCOUNTER — Ambulatory Visit (INDEPENDENT_AMBULATORY_CARE_PROVIDER_SITE_OTHER): Payer: Medicare Other | Admitting: Obstetrics and Gynecology

## 2023-04-03 ENCOUNTER — Other Ambulatory Visit: Payer: Self-pay

## 2023-04-03 VITALS — BP 163/91 | HR 101 | Ht 64.0 in | Wt 266.9 lb

## 2023-04-03 DIAGNOSIS — N858 Other specified noninflammatory disorders of uterus: Secondary | ICD-10-CM | POA: Diagnosis not present

## 2023-04-03 DIAGNOSIS — N939 Abnormal uterine and vaginal bleeding, unspecified: Secondary | ICD-10-CM

## 2023-04-03 DIAGNOSIS — R102 Pelvic and perineal pain: Secondary | ICD-10-CM | POA: Diagnosis not present

## 2023-04-03 MED ORDER — NORETHINDRONE ACETATE 5 MG PO TABS
10.0000 mg | ORAL_TABLET | Freq: Every day | ORAL | 0 refills | Status: DC
Start: 2023-04-03 — End: 2023-04-30
  Filled 2023-04-03: qty 6, 3d supply, fill #0
  Filled 2023-04-03: qty 54, 27d supply, fill #0

## 2023-04-03 MED ORDER — METHOCARBAMOL 500 MG PO TABS
500.0000 mg | ORAL_TABLET | Freq: Four times a day (QID) | ORAL | 0 refills | Status: DC
Start: 2023-04-03 — End: 2023-04-20
  Filled 2023-04-03: qty 15, 4d supply, fill #0

## 2023-04-03 NOTE — H&P (View-Only) (Signed)
GYNECOLOGY VISIT  Patient name: Jill Shaw MRN [id number]  Date of birth: 07/04/1976 Chief Complaint:   DUB  History:  Jill Shaw is a 47 y.o. G3P0 being seen today for EMB due to AUB. Reports passing large clots and feeling like she has to push as though she is having contractions. Had been taking oxycodone for pain but pain management provider is currently weaning. Notes pain is in lower abdomen/pelvis and radiates to inside of her vagina. Daily bleeding.   Notable hx of minerva endometrial ablation in 2020. Has been on depo and either norethindrone or megace in addition to the depo provera.     Past Medical History:  Diagnosis Date   Anemia    Anxiety    Arthritis    knees, hands   Asthma    Chronic diastolic (congestive) heart failure (HCC)    COPD (chronic obstructive pulmonary disease) (HCC)    Diabetes mellitus (HCC)    DKA (diabetic ketoacidosis) (HCC)    Dysfunctional uterine bleeding    Gastritis    GERD (gastroesophageal reflux disease)    Hypertension    Intractable nausea and vomiting 03/23/2021   Neuromuscular disorder (HCC)    neuropathy feet   Seizures (HCC) 09/12/2017   pt states r/t stress and blood sugar - no meds last one 4 months ago, not seen neurologist   Sickle cell trait (HCC)    Smoker    Vitamin D deficiency 10/2019   Wears glasses     Past Surgical History:  Procedure Laterality Date   CESAREAN SECTION     x 1. for twins   DILATION AND CURETTAGE OF UTERUS N/A 08/20/2019   Procedure: DILATATION AND CURETTAGE;  Surgeon: Allie Bossier, MD;  Location: [facility];  Service: Gynecology;  Laterality: N/A;   ENDOMETRIAL ABLATION N/A 08/20/2019   Procedure: Minerva Ablation;  Surgeon: Allie Bossier, MD;  Location: [facility];  Service: Gynecology;  Laterality: N/A;   EYE SURGERY Bilateral    laser right and cataract removed left eye   LEFT HEART CATH AND CORONARY ANGIOGRAPHY N/A 08/30/2021   Procedure: LEFT HEART CATH AND CORONARY ANGIOGRAPHY;   Surgeon: Elder Negus, MD;  Location: [facility];  Service: Cardiovascular;  Laterality: N/A;   RADIOLOGY WITH ANESTHESIA N/A 09/16/2019   Procedure: MRI WITH ANESTHESIA   L SPINE WITHOUT CONTRAST, T SPINE WITHOUT CONTRAST , CERVICAL WITHOUT CONTRAST;  Surgeon: Radiologist, Medication, MD;  Location: [facility];  Service: Radiology;  Laterality: N/A;   TUBAL LIGATION     interval BTL   UPPER GI ENDOSCOPY  07/2017    The following portions of the patient's history were reviewed and updated as appropriate: allergies, current medications, past family history, past medical history, past social history, past surgical history and problem list.   Health Maintenance:   Last pap     Component Value Date/Time   DIAGPAP  07/25/2021 1442    - Negative for intraepithelial lesion or malignancy (NILM)   DIAGPAP  07/25/2017 0000    NEGATIVE FOR INTRAEPITHELIAL LESIONS OR MALIGNANCY.   HPVHIGH Negative 07/25/2021 1442   ADEQPAP  07/25/2021 1442    Satisfactory for evaluation; transformation zone component PRESENT.   ADEQPAP  07/25/2017 0000    Satisfactory for evaluation  endocervical/transformation zone component PRESENT.    High Risk HPV: Positive  Adequacy:  Satisfactory for evaluation, transformation zone component PRESENT  Diagnosis:  Atypical squamous cells of undetermined significance (ASC-US)  Last mammogram: 12/2021  BIRADS 1   Review of Systems:  Pertinent items are noted in HPI. Comprehensive review of systems was otherwise negative.   Objective:  Physical Exam Wt 266 lb 14.4 oz (121.1 kg)   BMI 45.81 kg/m    Physical Exam Vitals and nursing note reviewed. Exam conducted with a chaperone present.  Constitutional:      Appearance: Normal appearance.  HENT:     Head: Normocephalic and atraumatic.  Pulmonary:     Effort: Pulmonary effort is normal.     Breath sounds: Normal breath sounds.  Genitourinary:    General: Normal vulva.     Exam position: Lithotomy  position.     Vagina: Normal.     Cervix: Normal.  Skin:    General: Skin is warm and dry.  Neurological:     General: No focal deficit present.     Mental Status: She is alert.  Psychiatric:        Mood and Affect: Mood normal.        Behavior: Behavior normal.        Thought Content: Thought content normal.        Judgment: Judgment normal.      Labs and Imaging Pelvic US 02/2023 CLINICAL DATA:  Abnormal year bleeding   EXAM: TRANSABDOMINAL AND TRANSVAGINAL ULTRASOUND OF PELVIS   TECHNIQUE: Both transabdominal and transvaginal ultrasound examinations of the pelvis were performed. Transabdominal technique was performed for global imaging of the pelvis including uterus, ovaries, adnexal regions, and pelvic cul-de-sac. It was necessary to proceed with endovaginal exam following the transabdominal exam to visualize the endometrium and ovaries.   COMPARISON:  None Available.   FINDINGS: Uterus   Measurements: 13.9 x 7.2 x 8.6 cm = volume: 456 mL. No fibroids or other mass visualized.   Endometrium   Thickness: 8 mm.  No focal abnormality visualized.   Right ovary   Not visualized.   Left ovary   Poorly visualized.  Left ovary is not grossly enlarged in size.   Other findings   No abnormal free fluid.   IMPRESSION: 1. Study is very limited due to patient body habitus and patient's inability to tolerate complete endovaginal imaging. 2. If bleeding remains unresponsive to hormonal or medical therapy, sonohysterogram should be considered for focal lesion work-up. (Ref: Radiological Reasoning: Algorithmic Workup of Abnormal Vaginal Bleeding with Endovaginal Sonography and Sonohysterography. AJR 2008; 161:W96-04) 3. A small amount of fluid in the endometrial canal may represent blood products given history. 4. Questionable mass/fibroid in the uterus, poorly evaluated.  Endometrial Biopsy Procedure  Patient identified, informed consent performed,  indication  reviewed, consent signed.  Reviewed risk of perforation, pain, bleeding, insufficient sample, etc were reviewd. Time out was performed.  Urine pregnancy test negative.  Speculum placed in the vagina.  Cervix visualized.  Cleaned with Betadine x 2.  Anterior cervix grasped anteriorly with a single tooth tenaculum.  Paracervical block was not administered.  Endometrial  pipelle was passed twice without difficulty and sample obtained. Patient had significant pain with obtaining specimen. Proceeded with attemtped IUD insertion, see below.   IUD Insertion Procedure Note Patient identified, informed consent performed, consent signed.   Discussed risks of irregular bleeding, cramping, infection, malpositioning or misplacement of the IUD outside the uterus which may require further procedure such as laparoscopy. Also discussed >99% contraception efficacy, increased risk of ectopic pregnancy with failure of method.  Time out was performed.  Urine pregnancy test negative.  Speculum placed in the vagina.  Cervix visualized.  Cleaned with Betadine x 2.  Anterior cervix grasped with a single tooth tenaculum.  After EMB completed, attempted IUD insertion but there was significant pain with attempted insertion and procedure was aborted.         Assessment & Plan:   1. Abnormal uterine bleeding (AUB) Now s/p EMB for evaluation of EMB. Had previously discussed hysterectomy but given co-morbidities would be at significant risk of perioperative complications. Unsuccessful EMB today - will resume norethindrone for now while awaiting pathology.  - Surgical pathology - norethindrone (AYGESTIN) 5 MG tablet; Take 2 tablets (10 mg total) by mouth daily.  Dispense: 60 tablet; Refill: 0  2. Pelvic pain Given short course of robaxin to help with pain - has several allergies listed and under care of pin management, limiting options for analgesia.  - methocarbamol (ROBAXIN) 500 MG tablet; Take 1 tablet (500 mg total) by  mouth 4 (four) times daily.  Dispense: 15 tablet; Refill: 0    Routine preventative health maintenance measures emphasized.  Lorriane Shire, MD [facility], [facility]

## 2023-04-03 NOTE — Telephone Encounter (Signed)
Seen today in-person by Briscoe Deutscher, MD.

## 2023-04-03 NOTE — Progress Notes (Signed)
GYNECOLOGY VISIT  Patient name: Jill Shaw MRN [id number]  Date of birth: 07/04/1976 Chief Complaint:   DUB  History:  Jill Shaw is a 47 y.o. G3P0 being seen today for EMB due to AUB. Reports passing large clots and feeling like she has to push as though she is having contractions. Had been taking oxycodone for pain but pain management provider is currently weaning. Notes pain is in lower abdomen/pelvis and radiates to inside of her vagina. Daily bleeding.   Notable hx of minerva endometrial ablation in 2020. Has been on depo and either norethindrone or megace in addition to the depo provera.     Past Medical History:  Diagnosis Date   Anemia    Anxiety    Arthritis    knees, hands   Asthma    Chronic diastolic (congestive) heart failure (HCC)    COPD (chronic obstructive pulmonary disease) (HCC)    Diabetes mellitus (HCC)    DKA (diabetic ketoacidosis) (HCC)    Dysfunctional uterine bleeding    Gastritis    GERD (gastroesophageal reflux disease)    Hypertension    Intractable nausea and vomiting 03/23/2021   Neuromuscular disorder (HCC)    neuropathy feet   Seizures (HCC) 09/12/2017   pt states r/t stress and blood sugar - no meds last one 4 months ago, not seen neurologist   Sickle cell trait (HCC)    Smoker    Vitamin D deficiency 10/2019   Wears glasses     Past Surgical History:  Procedure Laterality Date   CESAREAN SECTION     x 1. for twins   DILATION AND CURETTAGE OF UTERUS N/A 08/20/2019   Procedure: DILATATION AND CURETTAGE;  Surgeon: Allie Bossier, MD;  Location: [facility];  Service: Gynecology;  Laterality: N/A;   ENDOMETRIAL ABLATION N/A 08/20/2019   Procedure: Minerva Ablation;  Surgeon: Allie Bossier, MD;  Location: [facility];  Service: Gynecology;  Laterality: N/A;   EYE SURGERY Bilateral    laser right and cataract removed left eye   LEFT HEART CATH AND CORONARY ANGIOGRAPHY N/A 08/30/2021   Procedure: LEFT HEART CATH AND CORONARY ANGIOGRAPHY;   Surgeon: Elder Negus, MD;  Location: [facility];  Service: Cardiovascular;  Laterality: N/A;   RADIOLOGY WITH ANESTHESIA N/A 09/16/2019   Procedure: MRI WITH ANESTHESIA   L SPINE WITHOUT CONTRAST, T SPINE WITHOUT CONTRAST , CERVICAL WITHOUT CONTRAST;  Surgeon: Radiologist, Medication, MD;  Location: [facility];  Service: Radiology;  Laterality: N/A;   TUBAL LIGATION     interval BTL   UPPER GI ENDOSCOPY  07/2017    The following portions of the patient's history were reviewed and updated as appropriate: allergies, current medications, past family history, past medical history, past social history, past surgical history and problem list.   Health Maintenance:   Last pap     Component Value Date/Time   DIAGPAP  07/25/2021 1442    - Negative for intraepithelial lesion or malignancy (NILM)   DIAGPAP  07/25/2017 0000    NEGATIVE FOR INTRAEPITHELIAL LESIONS OR MALIGNANCY.   HPVHIGH Negative 07/25/2021 1442   ADEQPAP  07/25/2021 1442    Satisfactory for evaluation; transformation zone component PRESENT.   ADEQPAP  07/25/2017 0000    Satisfactory for evaluation  endocervical/transformation zone component PRESENT.    High Risk HPV: Positive  Adequacy:  Satisfactory for evaluation, transformation zone component PRESENT  Diagnosis:  Atypical squamous cells of undetermined significance (ASC-US)  Last mammogram: 12/2021  BIRADS 1   Review of Systems:  Pertinent items are noted in HPI. Comprehensive review of systems was otherwise negative.   Objective:  Physical Exam Wt 266 lb 14.4 oz (121.1 kg)   BMI 45.81 kg/m    Physical Exam Vitals and nursing note reviewed. Exam conducted with a chaperone present.  Constitutional:      Appearance: Normal appearance.  HENT:     Head: Normocephalic and atraumatic.  Pulmonary:     Effort: Pulmonary effort is normal.     Breath sounds: Normal breath sounds.  Genitourinary:    General: Normal vulva.     Exam position: Lithotomy  position.     Vagina: Normal.     Cervix: Normal.  Skin:    General: Skin is warm and dry.  Neurological:     General: No focal deficit present.     Mental Status: She is alert.  Psychiatric:        Mood and Affect: Mood normal.        Behavior: Behavior normal.        Thought Content: Thought content normal.        Judgment: Judgment normal.      Labs and Imaging Pelvic US 02/2023 CLINICAL DATA:  Abnormal year bleeding   EXAM: TRANSABDOMINAL AND TRANSVAGINAL ULTRASOUND OF PELVIS   TECHNIQUE: Both transabdominal and transvaginal ultrasound examinations of the pelvis were performed. Transabdominal technique was performed for global imaging of the pelvis including uterus, ovaries, adnexal regions, and pelvic cul-de-sac. It was necessary to proceed with endovaginal exam following the transabdominal exam to visualize the endometrium and ovaries.   COMPARISON:  None Available.   FINDINGS: Uterus   Measurements: 13.9 x 7.2 x 8.6 cm = volume: 456 mL. No fibroids or other mass visualized.   Endometrium   Thickness: 8 mm.  No focal abnormality visualized.   Right ovary   Not visualized.   Left ovary   Poorly visualized.  Left ovary is not grossly enlarged in size.   Other findings   No abnormal free fluid.   IMPRESSION: 1. Study is very limited due to patient body habitus and patient's inability to tolerate complete endovaginal imaging. 2. If bleeding remains unresponsive to hormonal or medical therapy, sonohysterogram should be considered for focal lesion work-up. (Ref: Radiological Reasoning: Algorithmic Workup of Abnormal Vaginal Bleeding with Endovaginal Sonography and Sonohysterography. AJR 2008; 161:W96-04) 3. A small amount of fluid in the endometrial canal may represent blood products given history. 4. Questionable mass/fibroid in the uterus, poorly evaluated.  Endometrial Biopsy Procedure  Patient identified, informed consent performed,  indication  reviewed, consent signed.  Reviewed risk of perforation, pain, bleeding, insufficient sample, etc were reviewd. Time out was performed.  Urine pregnancy test negative.  Speculum placed in the vagina.  Cervix visualized.  Cleaned with Betadine x 2.  Anterior cervix grasped anteriorly with a single tooth tenaculum.  Paracervical block was not administered.  Endometrial  pipelle was passed twice without difficulty and sample obtained. Patient had significant pain with obtaining specimen. Proceeded with attemtped IUD insertion, see below.   IUD Insertion Procedure Note Patient identified, informed consent performed, consent signed.   Discussed risks of irregular bleeding, cramping, infection, malpositioning or misplacement of the IUD outside the uterus which may require further procedure such as laparoscopy. Also discussed >99% contraception efficacy, increased risk of ectopic pregnancy with failure of method.  Time out was performed.  Urine pregnancy test negative.  Speculum placed in the vagina.  Cervix visualized.  Cleaned with Betadine x 2.  Anterior cervix grasped with a single tooth tenaculum.  After EMB completed, attempted IUD insertion but there was significant pain with attempted insertion and procedure was aborted.         Assessment & Plan:   1. Abnormal uterine bleeding (AUB) Now s/p EMB for evaluation of EMB. Had previously discussed hysterectomy but given co-morbidities would be at significant risk of perioperative complications. Unsuccessful EMB today - will resume norethindrone for now while awaiting pathology.  - Surgical pathology - norethindrone (AYGESTIN) 5 MG tablet; Take 2 tablets (10 mg total) by mouth daily.  Dispense: 60 tablet; Refill: 0  2. Pelvic pain Given short course of robaxin to help with pain - has several allergies listed and under care of pin management, limiting options for analgesia.  - methocarbamol (ROBAXIN) 500 MG tablet; Take 1 tablet (500 mg total) by  mouth 4 (four) times daily.  Dispense: 15 tablet; Refill: 0    Routine preventative health maintenance measures emphasized.  Lorriane Shire, MD [facility], [facility]

## 2023-04-04 NOTE — Telephone Encounter (Signed)
Patient came into office for appt and was evaluated by a provider.

## 2023-04-05 ENCOUNTER — Encounter: Payer: Self-pay | Admitting: General Practice

## 2023-04-05 LAB — SURGICAL PATHOLOGY

## 2023-04-06 ENCOUNTER — Encounter: Payer: Self-pay | Admitting: Obstetrics and Gynecology

## 2023-04-06 ENCOUNTER — Ambulatory Visit: Payer: Medicare Other | Admitting: Nurse Practitioner

## 2023-04-10 ENCOUNTER — Encounter: Payer: Self-pay | Admitting: Obstetrics and Gynecology

## 2023-04-15 ENCOUNTER — Encounter (HOSPITAL_COMMUNITY): Payer: Self-pay

## 2023-04-15 ENCOUNTER — Emergency Department (HOSPITAL_COMMUNITY)
Admission: EM | Admit: 2023-04-15 | Discharge: 2023-04-15 | Disposition: A | Discharge status: Home / Self Care | Payer: Medicare Other | Attending: Emergency Medicine | Admitting: Emergency Medicine

## 2023-04-15 DIAGNOSIS — Z7951 Long term (current) use of inhaled steroids: Secondary | ICD-10-CM | POA: Insufficient documentation

## 2023-04-15 DIAGNOSIS — R102 Pelvic and perineal pain: Secondary | ICD-10-CM | POA: Diagnosis not present

## 2023-04-15 DIAGNOSIS — J449 Chronic obstructive pulmonary disease, unspecified: Secondary | ICD-10-CM | POA: Insufficient documentation

## 2023-04-15 DIAGNOSIS — N939 Abnormal uterine and vaginal bleeding, unspecified: Secondary | ICD-10-CM

## 2023-04-15 DIAGNOSIS — I10 Essential (primary) hypertension: Secondary | ICD-10-CM | POA: Diagnosis not present

## 2023-04-15 DIAGNOSIS — E1110 Type 2 diabetes mellitus with ketoacidosis without coma: Secondary | ICD-10-CM | POA: Diagnosis not present

## 2023-04-15 DIAGNOSIS — Z794 Long term (current) use of insulin: Secondary | ICD-10-CM | POA: Diagnosis not present

## 2023-04-15 DIAGNOSIS — I110 Hypertensive heart disease with heart failure: Secondary | ICD-10-CM | POA: Diagnosis not present

## 2023-04-15 DIAGNOSIS — J45909 Unspecified asthma, uncomplicated: Secondary | ICD-10-CM | POA: Diagnosis not present

## 2023-04-15 DIAGNOSIS — I5032 Chronic diastolic (congestive) heart failure: Secondary | ICD-10-CM | POA: Diagnosis not present

## 2023-04-15 DIAGNOSIS — Z79899 Other long term (current) drug therapy: Secondary | ICD-10-CM | POA: Insufficient documentation

## 2023-04-15 DIAGNOSIS — R1084 Generalized abdominal pain: Secondary | ICD-10-CM | POA: Diagnosis not present

## 2023-04-15 DIAGNOSIS — Z955 Presence of coronary angioplasty implant and graft: Secondary | ICD-10-CM | POA: Insufficient documentation

## 2023-04-15 DIAGNOSIS — R1030 Lower abdominal pain, unspecified: Secondary | ICD-10-CM

## 2023-04-15 LAB — CBG MONITORING, ED
Glucose-Capillary: 41 mg/dL — CL (ref 70–99)
Glucose-Capillary: 145 mg/dL — ABNORMAL HIGH (ref 70–99)
Glucose-Capillary: 39 mg/dL — CL (ref 70–99)
Glucose-Capillary: 184 mg/dL — ABNORMAL HIGH (ref 70–99)

## 2023-04-15 LAB — CBC
HCT: 39.3 % (ref 36.0–46.0)
Hemoglobin: 13 g/dL (ref 12.0–15.0)
MCH: 27 pg (ref 26.0–34.0)
MCHC: 33.1 g/dL (ref 30.0–36.0)
MCV: 81.7 fL (ref 80.0–100.0)
Platelets: 210 10*3/uL (ref 150–400)
RBC: 4.81 MIL/uL (ref 3.87–5.11)
RDW: 16.5 % — ABNORMAL HIGH (ref 11.5–15.5)
WBC: 8.9 10*3/uL (ref 4.0–10.5)
nRBC: 0 % (ref 0.0–0.2)

## 2023-04-15 LAB — COMPREHENSIVE METABOLIC PANEL
ALT: 13 U/L (ref 0–44)
AST: 19 U/L (ref 15–41)
Albumin: 3.8 g/dL (ref 3.5–5.0)
Alkaline Phosphatase: 60 U/L (ref 38–126)
Anion gap: 11 (ref 5–15)
BUN: 6 mg/dL (ref 6–20)
CO2: 22 mmol/L (ref 22–32)
Calcium: 9.4 mg/dL (ref 8.9–10.3)
Chloride: 106 mmol/L (ref 98–111)
Creatinine, Ser: 1.1 mg/dL — ABNORMAL HIGH (ref 0.44–1.00)
GFR, Estimated: >60 mL/min (ref >60)
Glucose, Bld: 187 mg/dL — ABNORMAL HIGH (ref 70–99)
Potassium: 3.7 mmol/L (ref 3.5–5.1)
Sodium: 139 mmol/L (ref 135–145)
Total Bilirubin: 0.8 mg/dL (ref 0.3–1.2)
Total Protein: 6.8 g/dL (ref 6.5–8.1)

## 2023-04-15 LAB — HCG, SERUM, QUALITATIVE: Preg, Serum: NEGATIVE

## 2023-04-15 MED ORDER — ONDANSETRON 4 MG PO TBDP
4.0000 mg | ORAL_TABLET | Freq: Once | ORAL | Status: AC
  Administered 2023-04-15: 4 mg via ORAL
  Filled 2023-04-15: qty 1

## 2023-04-15 MED ORDER — DEXTROSE 50 % IV SOLN: 50.0000 mL | Freq: Once | INTRAVENOUS | Status: AC

## 2023-04-15 MED ORDER — HYDROMORPHONE HCL 1 MG/ML IJ SOLN
1.0000 mg | Freq: Once | INTRAMUSCULAR | Status: AC
  Administered 2023-04-15: 1 mg via INTRAVENOUS
  Filled 2023-04-15: qty 1

## 2023-04-15 MED ORDER — HYDROMORPHONE HCL 2 MG PO TABS: 2.0000 mg | ORAL_TABLET | ORAL | 0 refills | Status: DC | PRN

## 2023-04-15 MED ORDER — DEXTROSE 50 % IV SOLN
INTRAVENOUS | Status: AC
  Administered 2023-04-15: 50 mL via INTRAVENOUS
  Filled 2023-04-15: qty 50

## 2023-04-15 NOTE — ED Notes (Signed)
Dc instructions and scripts reviewed with pt no questions or concerns at this time. Will follow up with pcp.  

## 2023-04-15 NOTE — ED Triage Notes (Signed)
Per EMS, Pt, from home, c/o recurrent chronic lower abdominal pain and vaginal bleeding.  Pain score 10/10.  Pt reports taking oxycodone x6hrs ago w/o relief.  Pt sts "it's no longer working."  Pt missed her appointment w/ Gynecology.  Sts she is "waiting to have a procedure."    Also, Pt c/o chronic low back pain increasing x1 week.

## 2023-04-15 NOTE — Discharge Instructions (Signed)
Please take your medications as prescribed. Take tylenol/ibuprofen for pain. I recommend close follow-up with PCP and pain management for reevaluation.  Please do not hesitate to return to emergency department if worrisome signs symptoms we discussed become apparent.

## 2023-04-15 NOTE — ED Notes (Signed)
Pt asked for blood sugar to be checked and it was found to be 41.  Pt given 8oz orange juice w/ 2 additional sugar packets and a sandwich bag.

## 2023-04-15 NOTE — ED Notes (Signed)
Pt given 8oz Cola. EDP aware of CBG.

## 2023-04-15 NOTE — ED Provider Notes (Signed)
[place] EMERGENCY DEPARTMENT AT [facility] Provider Note   CSN: [id number] Arrival date & time: 04/15/23  7829     History {Add pertinent medical, surgical, social history, OB history to HPI:1} Chief Complaint  Patient presents with   Abdominal Pain   Vaginal Bleeding    Jill Shaw is a 47 y.o. female with a past medical history of asthma, COPD, diabetes, GERD, hypertension, seizure, s/p endometrial ablation, tubal ligation presents today for evaluation of abdominal and vaginal pain.  Patient states she has been dealing with vaginal bleeding in the last 4 months.  States yesterday and today she started to have severe pain in her abdomen and vagina at that cannot be controlled with oxycodone.  Patient was seen by OB/GYN on 04/03/2023.  Endometrial biopsy was attempted but unsuccessful due to pain.  Hysterectomy was also considered however patient is as high risk for surgery.  Patient will be seen by a cardiologist upcoming Friday to clear for possible surgery.    Transabdominal and transvaginal ultrasound done on 04/03/2023 showed 1. Study is very limited due to patient body habitus and patient's inability to tolerate complete endovaginal imaging. 2. If bleeding remains unresponsive to hormonal or medical therapy, sonohysterogram should be considered for focal lesion work-up. (Ref: Radiological Reasoning: Algorithmic Workup of Abnormal Vaginal Bleeding with Endovaginal Sonography and Sonohysterography. AJR 2008; 562:Z30-86) 3. A small amount of fluid in the endometrial canal may represent blood products given history. 4. Questionable mass/fibroid in the uterus, poorly evaluated.  Abdominal Pain Associated symptoms: vaginal bleeding   Vaginal Bleeding Associated symptoms: abdominal pain       Past Medical History:  Diagnosis Date   Anemia    Anxiety    Arthritis    knees, hands   Asthma    Chronic diastolic (congestive) heart failure (HCC)    COPD (chronic  obstructive pulmonary disease) (HCC)    Diabetes mellitus (HCC)    DKA (diabetic ketoacidosis) (HCC)    Dysfunctional uterine bleeding    Gastritis    GERD (gastroesophageal reflux disease)    Hypertension    Intractable nausea and vomiting 03/23/2021   Neuromuscular disorder (HCC)    neuropathy feet   Seizures (HCC) 09/12/2017   pt states r/t stress and blood sugar - no meds last one 4 months ago, not seen neurologist   Sickle cell trait (HCC)    Smoker    Vitamin D deficiency 10/2019   Wears glasses    Past Surgical History:  Procedure Laterality Date   CESAREAN SECTION     x 1. for twins   DILATION AND CURETTAGE OF UTERUS N/A 08/20/2019   Procedure: DILATATION AND CURETTAGE;  Surgeon: Allie Bossier, MD;  Location: [facility] OR;  Service: Gynecology;  Laterality: N/A;   ENDOMETRIAL ABLATION N/A 08/20/2019   Procedure: Minerva Ablation;  Surgeon: Allie Bossier, MD;  Location: [facility] OR;  Service: Gynecology;  Laterality: N/A;   EYE SURGERY Bilateral    laser right and cataract removed left eye   LEFT HEART CATH AND CORONARY ANGIOGRAPHY N/A 08/30/2021   Procedure: LEFT HEART CATH AND CORONARY ANGIOGRAPHY;  Surgeon: Elder Negus, MD;  Location: [facility] INVASIVE CV LAB;  Service: Cardiovascular;  Laterality: N/A;   RADIOLOGY WITH ANESTHESIA N/A 09/16/2019   Procedure: MRI WITH ANESTHESIA   L SPINE WITHOUT CONTRAST, T SPINE WITHOUT CONTRAST , CERVICAL WITHOUT CONTRAST;  Surgeon: Radiologist, Medication, MD;  Location: [facility] OR;  Service: Radiology;  Laterality: N/A;   TUBAL LIGATION  interval BTL   UPPER GI ENDOSCOPY  07/2017     Home Medications Prior to Admission medications   Medication Sig Start Date End Date Taking? Authorizing Provider  Accu-Chek Softclix Lancets lancets USE TO TEST AS DIRECTED UP TO FOUR TIMES DAILY. 03/06/22   Shamleffer, Konrad Dolores, MD  albuterol (PROVENTIL) (2.5 MG/3ML) 0.083% nebulizer solution Inhale into the lungs. 09/27/20   [provider]   albuterol (VENTOLIN HFA) 108 (90 Base) MCG/ACT inhaler Inhale 2 puffs into the lungs every 6 (six) hours as needed for wheezing or shortness of breath. 07/18/21   Luciano Cutter, MD  B-D ULTRAFINE III SHORT PEN 31G X 8 MM MISC USE AS DIRECTED EVERY MORNING, NOON, EVENING, AND AT BEDTIME 07/10/22   Shamleffer, Konrad Dolores, MD  Continuous Blood Gluc Sensor (FREESTYLE LIBRE 3 SENSOR) MISC Place 1 sensor on the skin every 14 days. Use to check glucose continuously 07/06/22   Ivonne Andrew, NP  Dexlansoprazole 30 MG capsule DR TAKE 1 CAPSULE (30 MG TOTAL) BY MOUTH DAILY. NEEDS OFFICE VISIT FOR ADDITIONAL REFILLS 03/26/23   Ivonne Andrew, NP  dicyclomine (BENTYL) 20 MG tablet Take 1 tablet (20 mg total) by mouth 2 (two) times daily. 02/16/23   Ivonne Andrew, NP  furosemide (LASIX) 40 MG tablet TAKE 1 TABLET(40 MG) BY MOUTH TWICE DAILY Patient taking differently: Take 40 mg by mouth 2 (two) times daily. 01/19/23   Patwardhan, Manish J, MD  glucose blood (ACCU-CHEK GUIDE) test strip USE TO TEST BLOOD GLUCOSE AS DIRECTED UP TO 4 TIMES DAILY 11/14/22   Ivonne Andrew, NP  HUMALOG KWIKPEN 200 UNIT/ML KwikPen Inject 50 Units into the skin 3 (three) times daily before meals. 03/19/23   Ivonne Andrew, NP  hydrochlorothiazide (MICROZIDE) 12.5 MG capsule TAKE 1 CAPSULE(12.5 MG) BY MOUTH DAILY Patient taking differently: Take 12.5 mg by mouth daily. 12/14/21   Cantwell, Celeste C, PA-C  losartan (COZAAR) 100 MG tablet Take 1 tablet (100 mg total) by mouth daily. 01/11/23   Ivonne Andrew, NP  methocarbamol (ROBAXIN) 500 MG tablet Take 1 tablet (500 mg total) by mouth 4 (four) times daily. 04/03/23   Lorriane Shire, MD  metoCLOPramide (REGLAN) 10 MG tablet Take 1 tablet (10 mg total) by mouth every 6 (six) hours as needed for nausea. 01/17/23   Al Decant, PA-C  metoprolol succinate (TOPROL-XL) 100 MG 24 hr tablet Take 100 mg by mouth daily. Take with or immediately following a meal.    [provider]  norethindrone (AYGESTIN) 5 MG tablet Take 2 tablets (10 mg total) by mouth daily. 04/03/23   Lorriane Shire, MD  ondansetron (ZOFRAN) 4 MG tablet Take 1 tablet (4 mg total) by mouth every 8 (eight) hours as needed for nausea or vomiting. 03/21/23   Lanae Boast, MD  oxyCODONE-acetaminophen (PERCOCET) 10-325 MG tablet Take 1 tablet by mouth 5 (five) times daily as needed for pain. 03/21/23   Jones Bales, NP  Pimozide 1 MG TABS Take by mouth. 02/10/23   [provider]  potassium chloride SA (KLOR-CON M) 20 MEQ tablet Take 1 tablet (20 mEq total) by mouth daily. 03/19/23   Ivonne Andrew, NP  rosuvastatin (CRESTOR) 20 MG tablet TAKE 1 TABLET(20 MG) BY MOUTH DAILY Patient taking differently: Take 20 mg by mouth daily. 01/19/23   Patwardhan, Anabel Bene, MD  SPIRIVA RESPIMAT 2.5 MCG/ACT AERS INHALE 2 PUFFS INTO THE LUNGS DAILY 02/03/22   Icard, Rachel Bo, DO  spironolactone (ALDACTONE) 50 MG tablet TAKE 1 TABLET(50 MG) BY MOUTH DAILY Patient taking differently: Take 50 mg by mouth once. 12/22/20   Patwardhan, Anabel Bene, MD  SYMBICORT 160-4.5 MCG/ACT inhaler INHALE 2 PUFFS INTO THE LUNGS TWICE DAILY 10/31/22   Icard, Bradley L, DO  TOUJEO MAX SOLOSTAR 300 UNIT/ML Solostar Pen Inject 60 Units into the skin in the morning. 02/12/23   [provider]      Allergies    Elavil [amitriptyline], Orudis [ketoprofen], Desyrel [trazodone], Aspirin, Motrin [ibuprofen], Naprosyn [naproxen], Neurontin [gabapentin], Sulfa antibiotics, Ultram [tramadol], Victoza [liraglutide], and Zegerid [omeprazole-sodium bicarbonate]    Review of Systems   Review of Systems  Gastrointestinal:  Positive for abdominal pain.  Genitourinary:  Positive for vaginal bleeding.    Physical Exam Updated Vital Signs BP (!) 148/62 (BP Location: Left Arm)   Pulse 96   Temp 98.6 F (37 C) (Oral)   Resp (!) 26   Ht 5\' 4"  (1.626 m)   Wt 120.7 kg   SpO2 100%   BMI 45.66 kg/m  Physical Exam  ED Results /  Procedures / Treatments   Labs (all labs ordered are listed, but only abnormal results are displayed) Labs Reviewed  CBC - Abnormal; Notable for the following components:      Result Value   RDW 16.5 (*)    All other components within normal limits  HCG, SERUM, QUALITATIVE  COMPREHENSIVE METABOLIC PANEL    EKG None  Radiology No results found.  Procedures Procedures  {Document cardiac monitor, telemetry assessment procedure when appropriate:1}  Medications Ordered in ED Medications - No data to display  ED Course/ Medical Decision Making/ A&P   {   Click here for ABCD2, HEART and other calculatorsREFRESH Note before signing :1}                          Medical Decision Making Amount and/or Complexity of Data Reviewed Labs: ordered.   ***  {Document critical care time when appropriate:1} {Document review of labs and clinical decision tools ie heart score, Chads2Vasc2 etc:1}  {Document your independent review of radiology images, and any outside records:1} {Document your discussion with family members, caretakers, and with consultants:1} {Document social determinants of health affecting pt's care:1} {Document your decision making why or why not admission, treatments were needed:1} Final Clinical Impression(s) / ED Diagnoses Final diagnoses:  None    Rx / DC Orders ED Discharge Orders     None

## 2023-04-16 ENCOUNTER — Encounter: Payer: Self-pay | Admitting: Physical Medicine and Rehabilitation

## 2023-04-16 ENCOUNTER — Telehealth: Payer: Self-pay | Admitting: Registered Nurse

## 2023-04-16 ENCOUNTER — Other Ambulatory Visit: Payer: Self-pay | Admitting: Nurse Practitioner

## 2023-04-16 ENCOUNTER — Other Ambulatory Visit: Payer: Self-pay

## 2023-04-16 ENCOUNTER — Other Ambulatory Visit (HOSPITAL_BASED_OUTPATIENT_CLINIC_OR_DEPARTMENT_OTHER): Payer: Self-pay

## 2023-04-16 MED ORDER — OXYCODONE-ACETAMINOPHEN 10-325 MG PO TABS
1.0000 | ORAL_TABLET | Freq: Four times a day (QID) | ORAL | 0 refills | Status: DC | PRN
  Filled 2023-04-16: qty 130, 33d supply, fill #0
  Filled 2023-04-18: qty 130, 30d supply, fill #0
  Filled ????-??-??: fill #0

## 2023-04-16 NOTE — Telephone Encounter (Signed)
PMP was Reviewed.  Call placrd to Jill Shaw, no answer. Left message to return the call.

## 2023-04-16 NOTE — Telephone Encounter (Signed)
Returned phone call from [place].

## 2023-04-16 NOTE — Telephone Encounter (Signed)
Return Jill Shaw call, no answer.  PMP was Reviewed.  We will continue with slow weaning of Oxycodone.  Jill Shaw has a scheduled appointment with Dr Carlis Abbott on 04/23/2023 for Qutenza.

## 2023-04-17 ENCOUNTER — Other Ambulatory Visit: Payer: Self-pay

## 2023-04-17 NOTE — Telephone Encounter (Signed)
Please advise. kh 

## 2023-04-18 ENCOUNTER — Other Ambulatory Visit: Payer: Self-pay

## 2023-04-18 MED ORDER — ONDANSETRON HCL 4 MG PO TABS
4.0000 mg | ORAL_TABLET | Freq: Three times a day (TID) | ORAL | 0 refills | Status: DC | PRN
  Filled 2023-04-18: qty 20, 7d supply, fill #0

## 2023-04-20 ENCOUNTER — Inpatient Hospital Stay: Payer: Self-pay | Admitting: Nurse Practitioner

## 2023-04-20 ENCOUNTER — Encounter: Payer: Self-pay | Admitting: Cardiology

## 2023-04-20 ENCOUNTER — Telehealth: Payer: Self-pay

## 2023-04-20 ENCOUNTER — Ambulatory Visit: Payer: Medicaid Other | Admitting: Cardiology

## 2023-04-20 VITALS — BP 143/84 | HR 106 | Resp 16 | Ht 64.0 in | Wt 275.8 lb

## 2023-04-20 DIAGNOSIS — I10 Essential (primary) hypertension: Secondary | ICD-10-CM | POA: Diagnosis not present

## 2023-04-20 DIAGNOSIS — Z01810 Encounter for preprocedural cardiovascular examination: Secondary | ICD-10-CM

## 2023-04-20 NOTE — Progress Notes (Signed)
Patient referred by Ivonne Andrew, NP for congestive heart failure  Subjective:   Jill Shaw, female    DOB: Dec 03, 1975, 47 y.o.   MRN: [id number]   Chief Complaint  Patient presents with   Hypertension   Hospitalization Follow-up     HPI  47 year old African-American female with hypertension, type 2 diabetes mellitus, HFpEF, moderate persistent asthma, tobacco dependence, morbid obesity, microcytic anemia, uterine dysfunction  Patient recently had ER visit for abdominal pain and vaginal bleeding. She may undergo surgery for the same. Blood pressure remains elevated. She wants to know if she can take tizanidine.     Current Outpatient Medications:    Accu-Chek Softclix Lancets lancets, USE TO TEST AS DIRECTED UP TO FOUR TIMES DAILY., Disp: 100 each, Rfl: 0   albuterol (PROVENTIL) (2.5 MG/3ML) 0.083% nebulizer solution, Inhale into the lungs., Disp: , Rfl:    albuterol (VENTOLIN HFA) 108 (90 Base) MCG/ACT inhaler, Inhale 2 puffs into the lungs every 6 (six) hours as needed for wheezing or shortness of breath., Disp: 8 g, Rfl: 6   B-D ULTRAFINE III SHORT PEN 31G X 8 MM MISC, USE AS DIRECTED EVERY MORNING, NOON, EVENING, AND AT BEDTIME, Disp: 400 each, Rfl: 0   Continuous Blood Gluc Sensor (FREESTYLE LIBRE 3 SENSOR) MISC, Place 1 sensor on the skin every 14 days. Use to check glucose continuously, Disp: 6 each, Rfl: 3   Dexlansoprazole 30 MG capsule DR, TAKE 1 CAPSULE (30 MG TOTAL) BY MOUTH DAILY. NEEDS OFFICE VISIT FOR ADDITIONAL REFILLS, Disp: 90 capsule, Rfl: 1   dicyclomine (BENTYL) 20 MG tablet, Take 1 tablet (20 mg total) by mouth 2 (two) times daily., Disp: 60 tablet, Rfl: 3   furosemide (LASIX) 40 MG tablet, TAKE 1 TABLET(40 MG) BY MOUTH TWICE DAILY (Patient taking differently: Take 40 mg by mouth 2 (two) times daily.), Disp: 180 tablet, Rfl: 3   glucose blood (ACCU-CHEK GUIDE) test strip, USE TO TEST BLOOD GLUCOSE AS DIRECTED UP TO 4 TIMES DAILY, Disp: 100 strip, Rfl:  4   HUMALOG KWIKPEN 200 UNIT/ML KwikPen, Inject 50 Units into the skin 3 (three) times daily before meals., Disp: 60 mL, Rfl: 0   hydrochlorothiazide (MICROZIDE) 12.5 MG capsule, TAKE 1 CAPSULE(12.5 MG) BY MOUTH DAILY (Patient taking differently: Take 12.5 mg by mouth daily.), Disp: 90 capsule, Rfl: 3   HYDROmorphone (DILAUDID) 2 MG tablet, Take 1 tablet (2 mg total) by mouth every 4 (four) hours as needed for up to 10 doses for severe pain., Disp: 10 tablet, Rfl: 0   losartan (COZAAR) 100 MG tablet, Take 1 tablet (100 mg total) by mouth daily., Disp: 90 tablet, Rfl: 0   methocarbamol (ROBAXIN) 500 MG tablet, Take 1 tablet (500 mg total) by mouth 4 (four) times daily., Disp: 15 tablet, Rfl: 0   metoCLOPramide (REGLAN) 10 MG tablet, Take 1 tablet (10 mg total) by mouth every 6 (six) hours as needed for nausea., Disp: 30 tablet, Rfl: 0   metoprolol succinate (TOPROL-XL) 100 MG 24 hr tablet, Take 100 mg by mouth daily. Take with or immediately following a meal., Disp: , Rfl:    norethindrone (AYGESTIN) 5 MG tablet, Take 2 tablets (10 mg total) by mouth daily., Disp: 60 tablet, Rfl: 0   ondansetron (ZOFRAN) 4 MG tablet, Take 1 tablet (4 mg total) by mouth every 8 (eight) hours as needed for nausea or vomiting., Disp: 20 tablet, Rfl: 0   oxyCODONE-acetaminophen (PERCOCET) 10-325 MG tablet, Take 1 tablet by mouth  every 6 (six) hours as needed for pain. May take a extra tablet 10 days out of the month, Disp: 130 tablet, Rfl: 0   Pimozide 1 MG TABS, Take by mouth., Disp: , Rfl:    potassium chloride SA (KLOR-CON M) 20 MEQ tablet, Take 1 tablet (20 mEq total) by mouth daily., Disp: 30 tablet, Rfl: 0   rosuvastatin (CRESTOR) 20 MG tablet, TAKE 1 TABLET(20 MG) BY MOUTH DAILY (Patient taking differently: Take 20 mg by mouth daily.), Disp: 90 tablet, Rfl: 3   SPIRIVA RESPIMAT 2.5 MCG/ACT AERS, INHALE 2 PUFFS INTO THE LUNGS DAILY, Disp: 4 g, Rfl: 6   spironolactone (ALDACTONE) 50 MG tablet, TAKE 1 TABLET(50 MG) BY  MOUTH DAILY (Patient taking differently: Take 50 mg by mouth once.), Disp: 30 tablet, Rfl: 3   SYMBICORT 160-4.5 MCG/ACT inhaler, INHALE 2 PUFFS INTO THE LUNGS TWICE DAILY, Disp: 10.2 g, Rfl: 0   TOUJEO MAX SOLOSTAR 300 UNIT/ML Solostar Pen, Inject 60 Units into the skin in the morning., Disp: , Rfl:     Cardiovascular and other pertinent studies:  EKG 04/20/2023: Sinus tachycardia 101 bpm Possible old anteroseptal infarct Poor R wave progression Nonspecific T-abnormality  Coronary angiogram 08/30/2021: LM: Normal LAD: Minimal luminal irregularities Lcx: Normal RCA: Normal Normal flow in all coronary beds   Normal LVEDP   Consider alternate cause for chest pain Recommend aggressive blood pressure control  Echocardiogram 06/01/2021:  Left ventricle cavity is normal in size. Mild concentric hypertrophy of  the left ventricle. Hyperdynamic LV systolic function >70%. Patient is  tachycardic throughout the day. Indeterminate diastolic filling pattern.  No significant valvular abnormality.  Normal right atrial pressure.  Lexiscan/midified Bruce Tetrofosmin stress test 05/23/2021: Lexiscan/modified Bruce nuclear stress test performed using 1-day protocol. SPECT images show very small sized, mild intensity, inferior apical myocardium with mild reversibility. Stress LVEF calculated 40-45%, although visually appears 50-55%. Low risk study.  Vascular US 07/16/2020: No DVT   Recent labs: 03/21/2023: Glucose 231, BUN/Cr 11/1.18. EGFR 58. Na/K 136/4.2.  H/H 12/36. MCV 82. Platelets 148 HbA1C 7.0%  11/2021; Chol 153, TG 144, HDL 54, LDL 74  07/26/2020: Glucose 160, BUN/Cr 10/1.13. EGFR 60. Na/K 140/4.0. Rest of the CMP normal H/H 9.5/34.7. MCV 70. Platelets 311 HbA1C 9.2% Chol 135, TG 166, HDL 29, LDL 77   Review of Systems  Cardiovascular: Positive for chest pain, dyspnea on exertion, leg swelling and orthopnea. Negative for palpitations and syncope.         Vitals:    04/20/23 1211  BP: (!) 143/84  Pulse: (!) 106  Resp: 16  SpO2: 97%    Body mass index is 47.34 kg/m. Filed Weights   04/20/23 1211  Weight: 275 lb 12.8 oz (125.1 kg)   Review of Systems  Cardiovascular:  Positive for chest pain and dyspnea on exertion. Negative for leg swelling, palpitations and syncope.  Psychiatric/Behavioral:         Tearful     Objective:   Physical Exam Physical Exam Vitals and nursing note reviewed.  Constitutional:      General: She is not in acute distress.    Appearance: She is obese.  Neck:     Vascular: No JVD.  Cardiovascular:     Rate and Rhythm: Normal rate and regular rhythm.     Heart sounds: Normal heart sounds. No murmur heard. Pulmonary:     Effort: Pulmonary effort is normal.     Breath sounds: Normal breath sounds. No wheezing or rales.  Musculoskeletal:  Right lower leg: No edema.     Left lower leg: No edema.           Assessment & Recommendations:   47 year old African-American female with hypertension, type 2 diabetes mellitus, HFpEF, moderate persistent asthma, tobacco dependence, morbid obesity, microcytic anemia, uterine dysfunction  Hypertension: Remains uncontrolled.  She has regular f/u w/PCP. Could consider adding amlodipine. Will defer to PCP.   Vaginal bleeding; Low cardiac risk.  No cardiac contraindication to use tizanidine.   F/u as needed    Elder Negus, MD Pager: [phone] Office: [phone]

## 2023-04-20 NOTE — Telephone Encounter (Signed)
VM left on nurse line requesting prescription for 6 tablets of norethindrone each day. Current prescription is for 2 tablets daily.

## 2023-04-23 ENCOUNTER — Telehealth: Payer: Self-pay | Admitting: Physical Medicine and Rehabilitation

## 2023-04-23 ENCOUNTER — Telehealth: Payer: Self-pay

## 2023-04-23 ENCOUNTER — Other Ambulatory Visit: Payer: Self-pay

## 2023-04-23 ENCOUNTER — Encounter: Payer: Self-pay | Admitting: Physical Medicine and Rehabilitation

## 2023-04-23 ENCOUNTER — Encounter
Payer: Medicare Other | Attending: Physical Medicine and Rehabilitation | Admitting: Physical Medicine and Rehabilitation

## 2023-04-23 ENCOUNTER — Encounter: Payer: Self-pay | Admitting: Cardiology

## 2023-04-23 ENCOUNTER — Other Ambulatory Visit: Payer: Self-pay | Admitting: Physical Medicine and Rehabilitation

## 2023-04-23 VITALS — BP 174/90 | HR 89 | Ht 64.0 in | Wt 265.0 lb

## 2023-04-23 DIAGNOSIS — K3184 Gastroparesis: Secondary | ICD-10-CM | POA: Insufficient documentation

## 2023-04-23 DIAGNOSIS — N939 Abnormal uterine and vaginal bleeding, unspecified: Secondary | ICD-10-CM | POA: Diagnosis not present

## 2023-04-23 DIAGNOSIS — E1143 Type 2 diabetes mellitus with diabetic autonomic (poly)neuropathy: Secondary | ICD-10-CM | POA: Diagnosis not present

## 2023-04-23 DIAGNOSIS — G629 Polyneuropathy, unspecified: Secondary | ICD-10-CM | POA: Insufficient documentation

## 2023-04-23 DIAGNOSIS — G8918 Other acute postprocedural pain: Secondary | ICD-10-CM | POA: Insufficient documentation

## 2023-04-23 DIAGNOSIS — R262 Difficulty in walking, not elsewhere classified: Secondary | ICD-10-CM | POA: Insufficient documentation

## 2023-04-23 DIAGNOSIS — Z794 Long term (current) use of insulin: Secondary | ICD-10-CM | POA: Diagnosis not present

## 2023-04-23 MED ORDER — TIZANIDINE HCL 4 MG PO TABS
4.0000 mg | ORAL_TABLET | Freq: Four times a day (QID) | ORAL | 0 refills | Status: DC | PRN
  Filled 2023-04-23: qty 120, 30d supply, fill #0

## 2023-04-23 MED ORDER — CAPSAICIN-CLEANSING GEL 8 % EX KIT
4.00 | PACK | Freq: Once | CUTANEOUS | Status: AC
Start: 2023-04-23 — End: 2023-04-23
  Administered 2023-04-23: 4 via TOPICAL

## 2023-04-23 NOTE — Progress Notes (Signed)
Subjective:    Patient ID: Jill Shaw, female    DOB: 1976/04/21, 47 y.o.   MRN: [id number]   HPI:     Jill Shaw is a 47 y.o. female who returns for f/u of inflammatory gastritis, chronic pain, diabetic peripheral neuropathy, and insomnia.   1) Insomnia: -She continues to experience insomnia at night. Asks about Ambien and I discussed that this is an addictive medication and there are other safer options we can try first that can also help with her pain.  -She is currently not taking either of these medications, or Amitriptyline or Trazodone.  -She takes Requip for resltless legs but this does not help. -She is still sleeping very poorly -She does use screens before bed time  2) Back pain secondary to lumbar radiculitis.  -she says she did not mean to request an early refill today, that she said this wrong, she was trying to explain to Jill Shaw -she has received 2 warnings for marijuana in her urine sample as well as for not bringing the number of correct pills to her appointments -she asks if tizanidine can be taken more than 4 times per day -She states her pain is located in her lower back radiating into her bilateral lower extremities and bilateral knee pain. She denies falling. She rates her pain 9. Her current exercise regime is walking.  -Jill Shaw Morphine equivalent is 20.00 MME.  -She was hospitalized for serotonin syndrome after use of Savella and Cymbalta.  -Back pain has been severe -She requests a renewal of her handicap placard as she is unable to walk 200 feet without stopping to rest.  -Her pain has been better controlled with Norco -she would like to get another XR given the chronicity of her pain.  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 -she is concerned that 30 days will end Saturday and she is not able to pick up her oxycodone until Monday as per pharmacy  3) Diabetic  peripheral neuropathy -she would like to try tens unit  -she would like to repeat Qutenza today -has been having a hard time weaning off her medications -she is not getting enough relief from tylenol #3 -she needs refill of her oxycodone -she is currently very limited in her mobility and needs her son's assistance 24/7 at home, she needs a not indicating this for her son to give at work. -she asks whether Qutenza can be administered more frequently than every 3 months, it has been helping a lot.  -she finds great benefits in her legs and found benefit in her hands as well but she kept forgetting and touching her face and this caused her face to birn -she is visiting her mother who has a pneumonia and asks whether her medications can be filled early -she asks whether her oxycodone can be released early as she is going on vacation next week.  -She tolerated Qutenza well and this provided 2 weeks of good relief, benefit started right away.  -Her peripheral neuropathy is worse in her bilateral lower extremities, especially the dorsum and soles of both feet, including the toes.  -now present in her hands as well- she has been dropping objects due to her neuropathy- present on the dorsal and ventral aspects of her hands -she is interested in following with a dietician -she is interested in medicines for her diabetes -she is trying to lose  weight  -needs handicap placard  4) Right shoulder and arm pain -notes limited range of motion in her shoulder -does have pain radiating into right arm from neck and arm feels heavy at times  5) Inflammatory gastritis -has been back and forth to the hospital as this has been really hurting her. She was given pain medication in the hospital. She was given medication for emesis.  -flexeril is making her sicker.  --patient says she has recently been diagnosed with this condition and pain has been severe. -she has her gastroenterologist recommended that she discuss  with [place] increasing her Percocet to better control her pain -she is currently prescribed 3 Percocet per day but feels she would benefit from up to 5 per day  6) GERD -she can have severe reflex at times -she was started on pepcid for a short time period to help with this.   7) Morbid obesity -BMI 47.03 -weight is 274 lbs -she lose 40 lbs when she was sick since she could not tolerate food but she has gained a lot of this back -she asks about supplement she can take for weight loss  8) Migraines: -she has been having headaches and asks if she can get a scan of her head.   9) Diabetic gastroparesis: -this has been severe and she was recommended by ED physician to decrease her opioid usage, but she is nervous to do so because she is in so much pain -she asks about trying tylenol with codeine  Pain Inventory Average Pain 9 Pain Right Now 9 My pain is sharp, burning, tingling and aching  In the last 24 hours, has pain interfered with the following? General activity 0 Relation with others 0 Enjoyment of life 5 What TIME of day is your pain at its worst? morning , daytime, evening and night Sleep (in general) Poor  Pain is worse with: walking, bending, sitting, inactivity, standing and some activites Pain improves with: heat/ice and medication Relief from Meds: 10  Family History  Problem Relation Age of Onset   Diabetes Mother    Hypertension Mother    Migraines Paternal Grandfather    Colon cancer Neg Hx    Esophageal cancer Neg Hx    Stomach cancer Neg Hx    Rectal cancer Neg Hx    Social History   Socioeconomic History   Marital status: Divorced    Spouse name: Not on file   Number of children: 3   Years of education: Not on file   Highest education level: Not on file  Occupational History   Occupation: unemployed  Tobacco Use   Smoking status: Some Days    Packs/day: 0.25    Years: 26.00    Additional pack years: 0.00    Total pack years: 6.50    Types:  Cigarettes    Last attempt to quit: 09/13/2020    Years since quitting: 2.6   Smokeless tobacco: Never   Tobacco comments:    4-5 cigarettes/day  Vaping Use   Vaping Use: Never used  Substance and Sexual Activity   Alcohol use: No   Drug use: No   Sexual activity: Not Currently    Birth control/protection: None  Other Topics Concern   Not on file  Social History Narrative   Right Handed   Lives in a one story apartment, but lives on the second floor   Drinks caffeine once in awhile   Social Determinants of Health   Financial Resource Strain: Low Risk  (  03/18/2023)   Overall Financial Resource Strain (CARDIA)    Difficulty of Paying Living Expenses: Not very hard  Food Insecurity: No Food Insecurity (03/18/2023)   Hunger Vital Sign    Worried About Running Out of Food in the Last Year: Never true    Ran Out of Food in the Last Year: Never true  Transportation Needs: No Transportation Needs (03/19/2023)   PRAPARE - Administrator, Civil Service (Medical): No    Lack of Transportation (Non-Medical): No  Physical Activity: Inactive (03/18/2023)   Exercise Vital Sign    Days of Exercise per Week: 0 days    Minutes of Exercise per Session: 0 min  Stress: Stress Concern Present (03/18/2023)   [employer] of Occupational Health - Occupational Stress Questionnaire    Feeling of Stress : To some extent  Social Connections: Moderately Isolated (03/18/2023)   Social Connection and Isolation Panel [NHANES]    Frequency of Communication with Friends and Family: More than three times a week    Frequency of Social Gatherings with Friends and Family: Three times a week    Attends Religious Services: 1 to 4 times per year    Active Member of Clubs or Organizations: No    Attends Banker Meetings: Never    Marital Status: Divorced   Past Surgical History:  Procedure Laterality Date   CESAREAN SECTION     x 1. for twins   DILATION AND CURETTAGE OF UTERUS N/A  08/20/2019   Procedure: DILATATION AND CURETTAGE;  Surgeon: Allie Bossier, MD;  Location: [facility] OR;  Service: Gynecology;  Laterality: N/A;   ENDOMETRIAL ABLATION N/A 08/20/2019   Procedure: Minerva Ablation;  Surgeon: Allie Bossier, MD;  Location: [facility] OR;  Service: Gynecology;  Laterality: N/A;   EYE SURGERY Bilateral    laser right and cataract removed left eye   LEFT HEART CATH AND CORONARY ANGIOGRAPHY N/A 08/30/2021   Procedure: LEFT HEART CATH AND CORONARY ANGIOGRAPHY;  Surgeon: Elder Negus, MD;  Location: [facility];  Service: Cardiovascular;  Laterality: N/A;   RADIOLOGY WITH ANESTHESIA N/A 09/16/2019   Procedure: MRI WITH ANESTHESIA   L SPINE WITHOUT CONTRAST, T SPINE WITHOUT CONTRAST , CERVICAL WITHOUT CONTRAST;  Surgeon: Radiologist, Medication, MD;  Location: [facility] OR;  Service: Radiology;  Laterality: N/A;   TUBAL LIGATION     interval BTL   UPPER GI ENDOSCOPY  07/2017   Past Surgical History:  Procedure Laterality Date   CESAREAN SECTION     x 1. for twins   DILATION AND CURETTAGE OF UTERUS N/A 08/20/2019   Procedure: DILATATION AND CURETTAGE;  Surgeon: Allie Bossier, MD;  Location: [facility];  Service: Gynecology;  Laterality: N/A;   ENDOMETRIAL ABLATION N/A 08/20/2019   Procedure: Minerva Ablation;  Surgeon: Allie Bossier, MD;  Location: [facility];  Service: Gynecology;  Laterality: N/A;   EYE SURGERY Bilateral    laser right and cataract removed left eye   LEFT HEART CATH AND CORONARY ANGIOGRAPHY N/A 08/30/2021   Procedure: LEFT HEART CATH AND CORONARY ANGIOGRAPHY;  Surgeon: Elder Negus, MD;  Location: [facility];  Service: Cardiovascular;  Laterality: N/A;   RADIOLOGY WITH ANESTHESIA N/A 09/16/2019   Procedure: MRI WITH ANESTHESIA   L SPINE WITHOUT CONTRAST, T SPINE WITHOUT CONTRAST , CERVICAL WITHOUT CONTRAST;  Surgeon: Radiologist, Medication, MD;  Location: [facility];  Service: Radiology;  Laterality: N/A;   TUBAL LIGATION     interval BTL  UPPER GI ENDOSCOPY   07/2017   Past Medical History:  Diagnosis Date   Anemia    Anxiety    Arthritis    knees, hands   Asthma    Chronic diastolic (congestive) heart failure (HCC)    COPD (chronic obstructive pulmonary disease) (HCC)    Diabetes mellitus (HCC)    DKA (diabetic ketoacidosis) (HCC)    Dysfunctional uterine bleeding    Gastritis    GERD (gastroesophageal reflux disease)    Hypertension    Intractable nausea and vomiting 03/23/2021   Neuromuscular disorder (HCC)    neuropathy feet   Seizures (HCC) 09/12/2017   pt states r/t stress and blood sugar - no meds last one 4 months ago, not seen neurologist   Sickle cell trait (HCC)    Smoker    Vitamin D deficiency 10/2019   Wears glasses    There were no vitals taken for this visit.  Opioid Risk Score:   Fall Risk Score:  `1  Depression screen [facility] 2/9     04/03/2023    2:33 PM 03/21/2023    1:34 PM 02/19/2023   10:00 AM 01/25/2023   10:52 AM 01/04/2023    3:22 PM 12/25/2022   10:18 AM 10/24/2022   10:03 AM  Depression screen PHQ 2/9  Decreased Interest 2 1 0 0 0 2 0  Down, Depressed, Hopeless 2 1 0 0 0 2 0  PHQ - 2 Score 4 2 0 0 0 4 0  Altered sleeping 2    3    Tired, decreased energy 2    3    Change in appetite 2        Feeling bad or failure about yourself  0        Trouble concentrating 2        Moving slowly or fidgety/restless 0        Suicidal thoughts 0        PHQ-9 Score 12    6       Review of Systems  Constitutional:  Positive for diaphoresis and unexpected weight change.  Respiratory:  Positive for shortness of breath and wheezing.   Gastrointestinal:  Positive for abdominal pain, constipation and nausea.  Musculoskeletal:  Positive for arthralgias, back pain, gait problem and myalgias.  Neurological:  Positive for dizziness, weakness and numbness.  Hematological:  Bruises/bleeds easily.  Psychiatric/Behavioral:  Positive for confusion and decreased concentration. The patient is nervous/anxious.   All other  systems reviewed and are negative.      Objective:  Not performed     Assessment & Plan:  1. Lumbar Radiculitis: Continue current medication regimen. Continue HEP as Tolerated. Provided with handicap placard. XR ordered. Note for work provided via Clinical cytogeneticist.  2. Fibromyalgia: Continue HEP as Tolerated. Continue current Medication regimen. Continue to Monitor.  3. Bilateral Knee Pain: Right knee is currently worst, XR ordered and will call with results.  Continue to Monitor.  4. Chronic Pain Syndrome: Continue Percocet. Discussed that we cannot send early refills if medications are lost. Increase percocet to 6 tabs per day prn. Will send final warning letter to patient today to say that we do not permit early refills, discussed this with her pharmacy as well -provided note explaining her inability to work due to her pain -discussed with patient when she last picked up oxycodone and when she is due to pick up next refill, called pharmacy to confirm  5. Insomnia: -Try to go outside near sunrise -  Get exercise during the day.  -Discussed good sleep hygiene: turning off all devices an hour before bedtime.  -Chamomile tea with dinner.  -Melatonin did for help. -warm bath or shower before bed. -apply lavender oil for forehead at night  6. Diabetic peripheral neuropathy -provided note for son to give to work to indicate that patient needs his assistance 24/7 due to her mobility deficits -checked with Qutenza rep and medicaid/medicare will not cover Qutenza more frequently than every 3 months  -refilled tizanidine -Discussed Qutenza as an option for neuropathic pain control. Discussed that this is a capsaicin patch, stronger than capsaicin cream. Discussed that it is currently approved for diabetic peripheral neuropathy and post-herpetic neuralgia, but that it has also shown benefit in treating other forms of neuropathy. Provided patient with link to site to learn more about the patch:  https://www.clark.biz/. Discussed that the patch would be placed in office and benefits usually last 3 months. Discussed that unintended exposure to capsaicin can cause severe irritation of eyes, mucous membranes, respiratory tract, and skin, but that Qutenza is a local treatment and does not have the systemic side effects of other nerve medications. Discussed that there may be pain, itching, erythema, and decreased sensory function associated with the application of Qutenza. Side effects usually subside within 1 week. A cold pack of analgesic medications can help with these side effects. Blood pressure can also be increased due to pain associated with administration of the patch.   4 patches of Qutenza was applied to the area of pain. Ice packs were applied during the procedure to ensure patient comfort. Blood pressure was monitored every 15 minutes. The patient tolerated the procedure well. Post-procedure instructions were given and follow-up has been scheduled.    -discussed positive response to last Qutenza treatment  -switch from tylenol #3 to tylenol #4 TID PRN given lack of relief from former  -discussed benefit from TENS unit in office  Prescribed Zynex Nexwave  7. Right sided shoulder and arm pain -discussed that could be a component or frozen shoulder and/or cervical radiculitis -will obtain right shoulder XR and cervical spine XR and call when results are available  8. Obesity: -Educated that current weight is 274 lbs and current BMI is 47.03 -shared foods that can assist in weight loss: 1) leafy greens- high in fiber and nutrients 2) dark chocolate- improves metabolism (if prefer sweetened, best to sweeten with honey instead of sugar).  3) cruciferous vegetables- high in fiber and protein 4) full fat yogurt: high in healthy fat, protein, calcium, and probiotics 5) apples- high in a variety of phytochemicals 6) nuts- high in fiber and protein that increase feelings of  fullness 7) grapefruit: rich in nutrients, antioxidants, and fiber (not to be taken with anticoagulation) 8) beans- high in protein and fiber 9) salmon- has high quality protein and healthy fats 10) green tea- rich in polyphenols 11) eggs- rich in choline and vitamin D 12) tuna- high protein, boosts metabolism 13) avocado- decreases visceral abdominal fat 14) chicken (pasture raised): high in protein and iron 15) blueberries- reduce abdominal fat and cholesterol 16) whole grains- decreases calories retained during digestion, speeds metabolism 17) chia seeds- curb appetite 18) chilies- increases fat metabolism  -Discussed supplements that can be used:  1) Metatrim 400mg  BID 30 minutes before breakfast and dinner  2) Sphaeranthus indicus and Garcinia mangostana (combinations of these and #1 can be found in capsicum and zychrome  3) green coffee bean extract 400mg  twice per day or Irvingia (african  mango) 150 to 300mg  twice per day.    9) Gastroesophageal reflux disease  -discussed the interaction between Tizanidine and Pepcid and that their additive effects could cause hypotension and bradycardia. -recommended eating small meals -recommended avoiding acidic and spicy foods -recommended apple cider vinegar in a cup of water before meals.   10) Inflammatory gastritis: -recommended following up with GI  11) Migraines: -Head CT ordered  12) Diabetic gastroparesis: -wean Percocet to 5 tabs per day -add tylenol with codeine TID to replace this, assist with wean -discussed that this has been severe   >40 minutes spent in discussion of risks and benefits of Qutenza and obtaining informed consent, discussion of q90 day follow-up and expectation of improvement in pain with each repeat application, discussed that cardiologist has cleared her to use tizanidine and sent this script for her, BP reviewed and is elevated, tizanidine can also help with this HTN, discussed that her gastroparesis has  been severe

## 2023-04-23 NOTE — Telephone Encounter (Signed)
Patient was seen 04/20/2023 Dr. Rosemary Holms said it is ok for her to use the Tizanidine in his note Thanks Delice Bison

## 2023-04-23 NOTE — Telephone Encounter (Signed)
Please their info and forward my note to them.  Thanks MJP

## 2023-04-23 NOTE — Telephone Encounter (Signed)
From pt, Patient needs letter stating its ok for her to use tizanidine ,can you add this please

## 2023-04-23 NOTE — Telephone Encounter (Signed)
Per chart review has prescription last filled 04/03/23 for 60 tablets and rx is aygestin 5mg  take 2 tablets daily.  I called patient and left a message I was calling to discuss her request and that we need to talk with her, call office back. Nancy Fetter

## 2023-04-23 NOTE — Progress Notes (Signed)
qu

## 2023-04-23 NOTE — Telephone Encounter (Signed)
Patient needs to make a follow up appointment with Jill Shaw week of 7/29 no later than 8/1 for medication refill and Qutenza with Dr. Renato Battles.

## 2023-04-24 NOTE — Telephone Encounter (Signed)
Called pt; VM left stating I am returning call and callback number given. Pt may contact office with further concerns.

## 2023-04-25 ENCOUNTER — Ambulatory Visit: Payer: Medicare Other

## 2023-04-26 ENCOUNTER — Other Ambulatory Visit: Payer: Self-pay

## 2023-04-26 ENCOUNTER — Encounter: Payer: Self-pay | Admitting: Physical Medicine and Rehabilitation

## 2023-04-26 MED ORDER — NITROGLYCERIN 0.4 MG SL SUBL
0.4000 mg | SUBLINGUAL_TABLET | SUBLINGUAL | 0 refills | Status: DC | PRN
  Filled 2023-04-26: qty 25, 1d supply, fill #0

## 2023-04-27 ENCOUNTER — Telehealth: Payer: Self-pay | Admitting: Family Medicine

## 2023-04-27 ENCOUNTER — Other Ambulatory Visit: Payer: Self-pay | Admitting: Nurse Practitioner

## 2023-04-27 ENCOUNTER — Other Ambulatory Visit: Payer: Self-pay

## 2023-04-27 ENCOUNTER — Encounter: Payer: Self-pay | Admitting: Physical Medicine and Rehabilitation

## 2023-04-27 DIAGNOSIS — N939 Abnormal uterine and vaginal bleeding, unspecified: Secondary | ICD-10-CM

## 2023-04-27 MED ORDER — TOUJEO MAX SOLOSTAR 300 UNIT/ML ~~LOC~~ SOPN
60.0000 [IU] | PEN_INJECTOR | Freq: Every morning | SUBCUTANEOUS | 2 refills | Status: DC
  Filled 2023-04-27: qty 3, 15d supply, fill #0
  Filled 2023-05-11: qty 3, 15d supply, fill #1
  Filled 2023-06-05: qty 3, 15d supply, fill #2

## 2023-04-27 NOTE — Telephone Encounter (Signed)
Patient is asking when will she be going under to get IUD inserted and will it be here at this facility?

## 2023-04-28 ENCOUNTER — Other Ambulatory Visit: Payer: Self-pay | Admitting: Nurse Practitioner

## 2023-04-28 DIAGNOSIS — I10 Essential (primary) hypertension: Secondary | ICD-10-CM

## 2023-04-29 DIAGNOSIS — M545 Low back pain, unspecified: Secondary | ICD-10-CM | POA: Diagnosis not present

## 2023-04-30 ENCOUNTER — Telehealth: Payer: Self-pay

## 2023-04-30 ENCOUNTER — Encounter: Payer: Self-pay | Admitting: *Deleted

## 2023-04-30 ENCOUNTER — Other Ambulatory Visit: Payer: Self-pay

## 2023-04-30 ENCOUNTER — Encounter (HOSPITAL_BASED_OUTPATIENT_CLINIC_OR_DEPARTMENT_OTHER): Payer: Self-pay | Admitting: Obstetrics and Gynecology

## 2023-04-30 MED ORDER — NORETHINDRONE ACETATE 5 MG PO TABS
5.0000 mg | ORAL_TABLET | Freq: Three times a day (TID) | ORAL | 0 refills | Status: DC
Start: 2023-04-30 — End: 2023-05-21
  Filled 2023-04-30: qty 90, 30d supply, fill #0

## 2023-04-30 NOTE — Progress Notes (Signed)
Pt added on today for surgery tomorrow 05-01-2023.  Unable to reach pt after several attempts.  Updated pt's medical history in epic from physician's notes. Left pt message to arrive at 1100 @ [facility] , address given, bring insurance/ phone ID, no jewelry, metal, piercing's, lotion, powder, perfume, wear loose clothing.  She may have two visitor's in waiting area, need  responsible driver and caregiver for next 24 hours when she goes home due to anesthesia.  No solid food of any type after midnight only the following liquid water, apple/ cranberry/ grape juice's, tea/ black coffee with sweetner only no type of cream/ milk products until 1000 after 1000 absolutely nothing by mouth including candy/ gum/ mints .  Asked to call before taking in am [phone] to get instructions for her morning medications including insulin at 0830.  Questions if pt check's blood sugar at home , if so, what do they run.   Anesthesia Review:  HTN, per cardiology uncontrolled;  chronic diastolic CHF;  moderate COPD/ moderate persistant asthma;  hx DKA;  chronic anemia;  IDDM2 uncontrolled;  06/ 2024 recent admission for acute renal failure on CKD 3/ severe sepsis POA d/t UTI (no hospital follow-up w/ pcp);  chronic pain syndrome;  diabetic gastroparesis  PCP:  Angus Seller NP  (lov 01-04-2023) Cardiologist : Dr Rosemary Holms Theron Arista 04-20-2023) Pulmonology:  Dr Tonia Brooms Theron Arista 01-04-2023) Pain clinic:  Dr Carlis Abbott Chest x-ray : 03-18-2023 EKG : 04-20-2023 Echo : 06-01-2021 Stress test: 05-23-2021 Cardiac Cath :  08-30-2021 Activity level:  unable to assess Sleep Study/ CPAP : no sleep study done Fasting Blood Sugar :      / Checks Blood Sugar -- times a day:    unable to assess Blood Thinner/ Instructions /Last Dose: no ASA / Instructions/ Last Dose : no

## 2023-04-30 NOTE — Telephone Encounter (Signed)
Patient called and left another message she is requested a refill of her norethindrone ( aygestin) a week ago . States taking one pill a day doesn't help and she has to take 6 pills a day to work. States she is out and needs asap and to call if questions. Also wants to know when she is getting appointment to IUD inserted and where? Of note she had called earlier in July re: refill and clinical staff called her twice but did not reach her. Nancy Fetter

## 2023-04-30 NOTE — Telephone Encounter (Signed)
Discussed patient with request with Dr. Briscoe Deutscher. She approved refill of Aygestin 5mg  TID , 90 tablets and to tell her if she takes more than that it is dangerous.  She also states they are working on getting her surgery scheduled. New rx sent to pharmacy. I called [facility] and left message that we got her message and sent in new RX and to follow dosage and instructions and do not take more than the dosage the doctor ordered as it is dangerous to your health. Also that they are working on your second request. I will send MyChart message. Nancy Fetter

## 2023-04-30 NOTE — Anesthesia Preprocedure Evaluation (Addendum)
Anesthesia Evaluation  Patient identified by MRN, date of birth, ID band Patient awake    Reviewed: Allergy & Precautions, NPO status , Patient's Chart, lab work & pertinent test results  Airway Mallampati: III  TM Distance: >3 FB Neck ROM: Full    Dental no notable dental hx.    Pulmonary asthma , COPD, Current Smoker and Patient abstained from smoking.   Pulmonary exam normal breath sounds clear to auscultation       Cardiovascular hypertension, Pt. on medications and Pt. on home beta blockers +CHF  Normal cardiovascular exam Rhythm:Regular Rate:Normal   05/23/2021 Lexiscan Lexiscan/modified Bruce nuclear stress test performed using 1-day protocol. SPECT images show very small sized, mild intensity, inferior apical myocardium with mild reversibility. Stress LVEF calculated 40-45%, although visually appears 50-55%. Low risk study.    Neuro/Psych Seizures -, Well Controlled,  PSYCHIATRIC DISORDERS Anxiety Depression       GI/Hepatic Neg liver ROS,GERD  ,,  Endo/Other  diabetes, Type 2    Renal/GU Renal InsufficiencyRenal diseaseLab Results      Component                Value               Date                      K                        3.7                 04/15/2023                        CREATININE               1.10 (H)            04/15/2023                   Musculoskeletal  (+) Arthritis ,  Fibromyalgia -  Abdominal  (+) + obese  Peds  Hematology Lab Results      Component                Value               Date                             HGB                      13.0                04/15/2023                       PLT                      210                 04/15/2023              Anesthesia Other Findings All: see list  Reproductive/Obstetrics                              Anesthesia Physical Anesthesia Plan  ASA: 3  Anesthesia Plan: General   Post-op Pain Management:  Precedex, Toradol IV (intra-op)* and Ofirmev IV (intra-op)*   Induction: Intravenous  PONV Risk Score and Plan: 4 or greater and Treatment may vary due to age or medical condition, Midazolam, Ondansetron and Dexamethasone  Airway Management Planned: LMA  Additional Equipment: None  Intra-op Plan:   Post-operative Plan: Extubation in OR  Informed Consent:      Dental advisory given  Plan Discussed with:   Anesthesia Plan Comments:         Anesthesia Quick Evaluation

## 2023-04-30 NOTE — Telephone Encounter (Signed)
Reached out to patient to let her know Dr. Briscoe Deutscher had a cancellation for 05/01/23 and to see if she was available? Patient confirmed she's available and asked to be placed in the 1:45 pm slot @[facility] . I provided pre-op instructions via the phone and advised the surgery details will be sent to pt's Mychart. Patient confirmed she has access to her Mychart acct. Pt is aware she must arrive at 11:45 am.

## 2023-05-01 ENCOUNTER — Encounter (HOSPITAL_BASED_OUTPATIENT_CLINIC_OR_DEPARTMENT_OTHER): Payer: Self-pay | Admitting: Obstetrics and Gynecology

## 2023-05-01 ENCOUNTER — Ambulatory Visit (HOSPITAL_BASED_OUTPATIENT_CLINIC_OR_DEPARTMENT_OTHER)
Admission: RE | Admit: 2023-05-01 | Discharge: 2023-05-01 | Disposition: A | Discharge status: Home / Self Care | Payer: Medicare Other | Attending: Obstetrics and Gynecology | Admitting: Obstetrics and Gynecology

## 2023-05-01 ENCOUNTER — Other Ambulatory Visit: Payer: Self-pay

## 2023-05-01 ENCOUNTER — Encounter (HOSPITAL_BASED_OUTPATIENT_CLINIC_OR_DEPARTMENT_OTHER)
Admission: RE | Disposition: A | Discharge status: Home / Self Care | Payer: Self-pay | Source: Home / Self Care | Attending: Obstetrics and Gynecology

## 2023-05-01 ENCOUNTER — Ambulatory Visit (HOSPITAL_BASED_OUTPATIENT_CLINIC_OR_DEPARTMENT_OTHER): Payer: Self-pay | Admitting: Anesthesiology

## 2023-05-01 ENCOUNTER — Ambulatory Visit (HOSPITAL_BASED_OUTPATIENT_CLINIC_OR_DEPARTMENT_OTHER): Payer: Medicare Other | Admitting: Anesthesiology

## 2023-05-01 DIAGNOSIS — R102 Pelvic and perineal pain unspecified side: Secondary | ICD-10-CM

## 2023-05-01 DIAGNOSIS — I110 Hypertensive heart disease with heart failure: Secondary | ICD-10-CM | POA: Insufficient documentation

## 2023-05-01 DIAGNOSIS — F17200 Nicotine dependence, unspecified, uncomplicated: Secondary | ICD-10-CM | POA: Diagnosis not present

## 2023-05-01 DIAGNOSIS — Z30430 Encounter for insertion of intrauterine contraceptive device: Secondary | ICD-10-CM

## 2023-05-01 DIAGNOSIS — N939 Abnormal uterine and vaginal bleeding, unspecified: Secondary | ICD-10-CM

## 2023-05-01 DIAGNOSIS — N858 Other specified noninflammatory disorders of uterus: Secondary | ICD-10-CM | POA: Diagnosis not present

## 2023-05-01 DIAGNOSIS — G4733 Obstructive sleep apnea (adult) (pediatric): Secondary | ICD-10-CM | POA: Diagnosis not present

## 2023-05-01 DIAGNOSIS — N399 Disorder of urinary system, unspecified: Secondary | ICD-10-CM

## 2023-05-01 DIAGNOSIS — I5032 Chronic diastolic (congestive) heart failure: Secondary | ICD-10-CM | POA: Diagnosis not present

## 2023-05-01 DIAGNOSIS — E782 Mixed hyperlipidemia: Secondary | ICD-10-CM | POA: Diagnosis not present

## 2023-05-01 DIAGNOSIS — N946 Dysmenorrhea, unspecified: Secondary | ICD-10-CM | POA: Diagnosis not present

## 2023-05-01 DIAGNOSIS — Z793 Long term (current) use of hormonal contraceptives: Secondary | ICD-10-CM | POA: Diagnosis not present

## 2023-05-01 DIAGNOSIS — Z01818 Encounter for other preprocedural examination: Secondary | ICD-10-CM

## 2023-05-01 HISTORY — DX: Type 2 diabetes mellitus with diabetic autonomic (poly)neuropathy: E11.43

## 2023-05-01 HISTORY — DX: Chronic kidney disease, stage 3 unspecified: N18.30

## 2023-05-01 HISTORY — DX: Irritable bowel syndrome with constipation: K58.1

## 2023-05-01 HISTORY — DX: Other chest pain: R07.89

## 2023-05-01 HISTORY — DX: Hyperlipidemia, unspecified: E78.5

## 2023-05-01 HISTORY — DX: Personal history of other endocrine, nutritional and metabolic disease: Z86.39

## 2023-05-01 HISTORY — DX: Iron deficiency anemia, unspecified: D50.9

## 2023-05-01 HISTORY — DX: Chronic pain syndrome: G89.4

## 2023-05-01 HISTORY — DX: Type 2 diabetes mellitus without complications: E11.9

## 2023-05-01 HISTORY — DX: Type 2 diabetes mellitus with diabetic polyneuropathy: E11.42

## 2023-05-01 HISTORY — DX: Major depressive disorder, single episode, unspecified: F32.9

## 2023-05-01 HISTORY — DX: Moderate persistent asthma, uncomplicated: J45.40

## 2023-05-01 HISTORY — DX: Fibromyalgia: M79.7

## 2023-05-01 HISTORY — DX: Generalized abdominal pain: R10.84

## 2023-05-01 HISTORY — DX: Personal history of other diseases of the digestive system: Z87.19

## 2023-05-01 HISTORY — PX: INTRAUTERINE DEVICE (IUD) INSERTION: SHX5877

## 2023-05-01 HISTORY — DX: Abnormal uterine and vaginal bleeding, unspecified: N93.9

## 2023-05-01 HISTORY — PX: HYSTEROSCOPY: SHX211

## 2023-05-01 HISTORY — DX: Generalized anxiety disorder: F41.1

## 2023-05-01 HISTORY — DX: Type 2 diabetes mellitus with diabetic autonomic (poly)neuropathy: K31.84

## 2023-05-01 HISTORY — DX: Chronic obstructive pulmonary disease, unspecified: J44.9

## 2023-05-01 HISTORY — PX: DILATION AND CURETTAGE OF UTERUS: SHX78

## 2023-05-01 LAB — POCT I-STAT, CHEM 8
BUN: 14 mg/dL (ref 6–20)
Calcium, Ion: 1.24 mmol/L (ref 1.15–1.40)
Chloride: 105 mmol/L (ref 98–111)
Creatinine, Ser: 1.2 mg/dL — ABNORMAL HIGH (ref 0.44–1.00)
Glucose, Bld: 136 mg/dL — ABNORMAL HIGH (ref 70–99)
HCT: 47 % — ABNORMAL HIGH (ref 36.0–46.0)
Hemoglobin: 16 g/dL — ABNORMAL HIGH (ref 12.0–15.0)
Potassium: 4.3 mmol/L (ref 3.5–5.1)
Sodium: 140 mmol/L (ref 135–145)
TCO2: 24 mmol/L (ref 22–32)

## 2023-05-01 LAB — POCT PREGNANCY, URINE: Preg Test, Ur: NEGATIVE

## 2023-05-01 LAB — GLUCOSE, CAPILLARY: Glucose-Capillary: 154 mg/dL — ABNORMAL HIGH (ref 70–99)

## 2023-05-01 SURGERY — HYSTEROSCOPY
Anesthesia: General | Site: Vagina

## 2023-05-01 MED ORDER — MIDAZOLAM HCL 5 MG/5ML IJ SOLN
INTRAMUSCULAR | Status: DC | PRN
  Administered 2023-05-01: 2 mg via INTRAVENOUS

## 2023-05-01 MED ORDER — DEXMEDETOMIDINE HCL IN NACL 80 MCG/20ML IV SOLN
INTRAVENOUS | Status: DC | PRN
  Administered 2023-05-01 (×2): 4 ug via INTRAVENOUS

## 2023-05-01 MED ORDER — OXYCODONE-ACETAMINOPHEN 5-325 MG PO TABS
ORAL_TABLET | ORAL | Status: AC
  Filled 2023-05-01: qty 2

## 2023-05-01 MED ORDER — HYDROMORPHONE HCL 1 MG/ML IJ SOLN
INTRAMUSCULAR | Status: AC
  Filled 2023-05-01: qty 1

## 2023-05-01 MED ORDER — ACETAMINOPHEN 500 MG PO TABS: 1000.0000 mg | ORAL_TABLET | ORAL | Status: DC

## 2023-05-01 MED ORDER — BUPIVACAINE HCL (PF) 0.25 % IJ SOLN
INTRAMUSCULAR | Status: DC | PRN
  Administered 2023-05-01: 20 mL

## 2023-05-01 MED ORDER — OXYCODONE HCL 5 MG PO TABS: 5.0000 mg | ORAL_TABLET | Freq: Once | ORAL | Status: DC | PRN

## 2023-05-01 MED ORDER — PHENYLEPHRINE 80 MCG/ML (10ML) SYRINGE FOR IV PUSH (FOR BLOOD PRESSURE SUPPORT)
PREFILLED_SYRINGE | INTRAVENOUS | Status: DC | PRN
  Administered 2023-05-01: 80 ug via INTRAVENOUS

## 2023-05-01 MED ORDER — PROPOFOL 10 MG/ML IV BOLUS
INTRAVENOUS | Status: AC
  Filled 2023-05-01: qty 20

## 2023-05-01 MED ORDER — LEVONORGESTREL 20 MCG/DAY IU IUD
1.0000 | INTRAUTERINE_SYSTEM | INTRAUTERINE | Status: AC
  Administered 2023-05-01: 1 via INTRAUTERINE

## 2023-05-01 MED ORDER — ACETAMINOPHEN 500 MG PO TABS
ORAL_TABLET | ORAL | Status: AC
  Filled 2023-05-01: qty 2

## 2023-05-01 MED ORDER — LEVONORGESTREL 20 MCG/DAY IU IUD
INTRAUTERINE_SYSTEM | INTRAUTERINE | Status: AC
  Filled 2023-05-01: qty 1

## 2023-05-01 MED ORDER — SODIUM CHLORIDE 0.9 % IR SOLN
Status: DC | PRN
  Administered 2023-05-01: 3000 mL

## 2023-05-01 MED ORDER — KETOROLAC TROMETHAMINE 30 MG/ML IJ SOLN: 30.0000 mg | Freq: Once | INTRAMUSCULAR | Status: DC | PRN

## 2023-05-01 MED ORDER — MIDAZOLAM HCL 2 MG/2ML IJ SOLN
INTRAMUSCULAR | Status: AC
  Filled 2023-05-01: qty 2

## 2023-05-01 MED ORDER — FENTANYL CITRATE (PF) 100 MCG/2ML IJ SOLN
INTRAMUSCULAR | Status: DC | PRN
  Administered 2023-05-01: 50 ug via INTRAVENOUS
  Administered 2023-05-01 (×2): 25 ug via INTRAVENOUS

## 2023-05-01 MED ORDER — SODIUM CHLORIDE 0.9 % IV SOLN: INTRAVENOUS | Status: DC

## 2023-05-01 MED ORDER — DEXAMETHASONE SODIUM PHOSPHATE 10 MG/ML IJ SOLN
INTRAMUSCULAR | Status: AC
  Filled 2023-05-01: qty 1

## 2023-05-01 MED ORDER — PHENYLEPHRINE 80 MCG/ML (10ML) SYRINGE FOR IV PUSH (FOR BLOOD PRESSURE SUPPORT)
PREFILLED_SYRINGE | INTRAVENOUS | Status: AC
  Filled 2023-05-01: qty 10

## 2023-05-01 MED ORDER — LIDOCAINE HCL (PF) 2 % IJ SOLN
INTRAMUSCULAR | Status: AC
  Filled 2023-05-01: qty 5

## 2023-05-01 MED ORDER — OXYCODONE-ACETAMINOPHEN 5-325 MG PO TABS: 2.0000 | ORAL_TABLET | Freq: Once | ORAL | Status: DC

## 2023-05-01 MED ORDER — OXYCODONE HCL 5 MG/5ML PO SOLN: 5.0000 mg | Freq: Once | ORAL | Status: DC | PRN

## 2023-05-01 MED ORDER — LIDOCAINE 2% (20 MG/ML) 5 ML SYRINGE
INTRAMUSCULAR | Status: DC | PRN
  Administered 2023-05-01: 80 mg via INTRAVENOUS

## 2023-05-01 MED ORDER — ONDANSETRON HCL 4 MG/2ML IJ SOLN
INTRAMUSCULAR | Status: DC | PRN
Start: 2023-05-01 — End: 2023-05-01
  Administered 2023-05-01: 4 mg via INTRAVENOUS

## 2023-05-01 MED ORDER — ONDANSETRON HCL 4 MG/2ML IJ SOLN: 4.0000 mg | Freq: Once | INTRAMUSCULAR | Status: DC | PRN

## 2023-05-01 MED ORDER — HYDROMORPHONE HCL 1 MG/ML IJ SOLN
0.2500 mg | INTRAMUSCULAR | Status: DC | PRN
  Administered 2023-05-01 (×4): 0.25 mg via INTRAVENOUS

## 2023-05-01 MED ORDER — FENTANYL CITRATE (PF) 100 MCG/2ML IJ SOLN
INTRAMUSCULAR | Status: AC
  Filled 2023-05-01: qty 2

## 2023-05-01 MED ORDER — ONDANSETRON HCL 4 MG/2ML IJ SOLN
INTRAMUSCULAR | Status: AC
  Filled 2023-05-01: qty 2

## 2023-05-01 MED ORDER — PROPOFOL 10 MG/ML IV BOLUS
INTRAVENOUS | Status: DC | PRN
Start: 2023-05-01 — End: 2023-05-01
  Administered 2023-05-01: 200 mg via INTRAVENOUS

## 2023-05-01 SURGICAL SUPPLY — 13 items
GLOVE BIO SURGEON STRL SZ7 (GLOVE) ×3 IMPLANT
GLOVE BIOGEL PI IND STRL 7.0 (GLOVE) IMPLANT
GLOVE SURG SS PI 6.5 STRL IVOR (GLOVE) IMPLANT
GLOVE SURG SS PI 7.5 STRL IVOR (GLOVE) IMPLANT
GOWN STRL REUS W/TWL LRG LVL3 (GOWN DISPOSABLE) IMPLANT
GOWN STRL REUS W/TWL XL LVL3 (GOWN DISPOSABLE) ×3 IMPLANT
KIT PROCEDURE FLUENT (KITS) IMPLANT
KIT TURNOVER CYSTO (KITS) ×3 IMPLANT
PACK VAGINAL MINOR WOMEN LF (CUSTOM PROCEDURE TRAY) ×3 IMPLANT
PAD OB MATERNITY 4.3X12.25 (PERSONAL CARE ITEMS) ×3 IMPLANT
SEAL ROD LENS SCOPE MYOSURE (ABLATOR) IMPLANT
SLEEVE SCD COMPRESS KNEE MED (STOCKING) ×3 IMPLANT
SOL PREP POV-IOD 4OZ 10% (MISCELLANEOUS) IMPLANT

## 2023-05-01 NOTE — Anesthesia Procedure Notes (Signed)
Procedure Name: LMA Insertion Date/Time: 05/01/2023 12:11 PM  Performed by: Keirsten Matuska D, CRNAPre-anesthesia Checklist: Patient identified, Emergency Drugs available, Suction available and Patient being monitored Patient Re-evaluated:Patient Re-evaluated prior to induction Oxygen Delivery Method: Circle system utilized Preoxygenation: Pre-oxygenation with 100% oxygen Induction Type: IV induction Ventilation: Mask ventilation without difficulty LMA: LMA inserted LMA Size: 4.0 Tube type: Oral Number of attempts: 1 Placement Confirmation: positive ETCO2 and breath sounds checked- equal and bilateral Tube secured with: Tape Dental Injury: Teeth and Oropharynx as per pre-operative assessment

## 2023-05-01 NOTE — Progress Notes (Addendum)
left message for patient to call [phone] to go over medications and insulin.   Addendum: Spoke with patient by phone and reviewed medication record, patient stated cbg this am was 272. Patient was instructed to take 1/2 of am toujeo dose ( take 30 units). Pt instructed to take the following medications this am: dexilant, spiriva, symbicort, albuterol nebulizer, albuterol inhaler prn/bring inhaler.   Pt stated understanding all pre op instructions

## 2023-05-01 NOTE — Brief Op Note (Signed)
05/01/2023  12:40 PM  PATIENT:  Jill Shaw  47 y.o. female  PRE-OPERATIVE DIAGNOSIS:  Abnormal uterine bleeding Dysmenorreha IUD insertion  POST-OPERATIVE DIAGNOSIS:  Abnormal uterine bleedingDysmenorrehaIUD insertion  PROCEDURE:  Procedure(s): HYSTEROSCOPY (N/A) INTRAUTERINE DEVICE (IUD) INSERTION (N/A) DILATATION AND CURETTAGE  SURGEON:  Surgeons and Role:    * Lorriane Shire, MD - Primary  PHYSICIAN ASSISTANT: n/a  ASSISTANTS: none   ANESTHESIA:   general and paracervical block  EBL:  5 mL   BLOOD ADMINISTERED:none  DRAINS: none   LOCAL MEDICATIONS USED:  BUPIVICAINE   SPECIMEN:  Source of Specimen:  endometrial curettings  DISPOSITION OF SPECIMEN:  PATHOLOGY  COUNTS:  YES  TOURNIQUET:  * No tourniquets in log *  DICTATION: .Note written in EPIC  PLAN OF CARE: Discharge to home after PACU  PATIENT DISPOSITION:  PACU - hemodynamically stable.   Delay start of Pharmacological VTE agent (>24hrs) due to surgical blood loss or risk of bleeding: no

## 2023-05-01 NOTE — Interval H&P Note (Signed)
History and Physical Interval Note:  05/01/2023 11:36 AM  Jill Shaw  has presented today for surgery, with the diagnosis of Abnormal uterine bleeding Dysmenorreha IUD insertion.  The various methods of treatment have been discussed with the patient and family. After consideration of risks, benefits and other options for treatment, the patient has consented to  Procedure(s): HYSTEROSCOPY (N/A) INTRAUTERINE DEVICE (IUD) INSERTION (N/A) as a surgical intervention.  The patient's history has been reviewed, patient examined, no change in status, stable for surgery.  I have reviewed the patient's chart and labs.  Questions were answered to the patient's satisfaction.     Franceska Strahm

## 2023-05-01 NOTE — Op Note (Signed)
Jill Shaw PROCEDURE DATE: 05/01/2023  PREOPERATIVE DIAGNOSIS: abnormal uterine bleeding  POSTOPERATIVE DIAGNOSIS: abnormal uterine bleeding PROCEDURE:    hysteroscopy, dilation and curettage, IUD insertion  SURGEON: Lorriane Shire, MD ASSISTANT:  n/a  INDICATIONS: 47 y.o. G3P0 with abnormal uterine bleeding.  Risks of surgery were discussed with the patient including but not limited to: bleeding which may require transfusion; infection which may require antibiotics; injury to surrounding organs; need for additional procedures including laparotomy;  and other postoperative/anesthesia complications. Written informed consent was obtained.    FINDINGS:  Normal vulva, normal appearing cervix .  Hysteroscopy: bilateral tubal ostia visualized, slightly hypervascular left cornua, otherwise atrophic endometrium   ANESTHESIA: General, paracervical block INTRAVENOUS FLUIDS:  300 ml of LR ESTIMATED BLOOD LOSS:  5 ml URINE OUTPUT: n/a SPECIMENS: endometrial curettings FLUID DEFICIT: 90cc normal saline COMPLICATIONS:  None immediate.   PROCEDURE: The patient was taken to the operating room where spinal analgesia was inserted. SCDs were in place.  Time out was performed. Patient was placed in dorsolithotomy in [place] stirrups. She was prepped and draped in the usual sterile fashion. A Red Rubber catheter was used to drain her bladder. A speculum was placeed in the vagina. The cervix was visualized anteriorly and grasped with a single-tooth tenaculum. Paracervical block was performed with 0.5% bupivicaine with 20 cc injected. Sequential dilation was performed with Shawnie Pons dilators. The hysteroscope was inserted and the endometrial cavity and inspected. The findings above were noted. The cavity was sharply curetted and the hysteroscope reintroduced into the cavity to re-evaluate the cavity.  The hysteroscope was removed. Sharp curettage was performed in all 4 quadrants tissue was obtained. Mirena IUD was  inserted in usual fashion and strings cut to 2cm. All instruments were removed from the vagina. All instrument, needle and lap counts were correct x2. The patient was awakened and is recovering in stable condition.  Lorriane Shire, MD Minimally Invasive Gynecologic Surgery and Chronic Pelvic Pain Specialist Obstetrics and Gynecology, [facility], [place] [facility] 05/01/2023

## 2023-05-01 NOTE — Transfer of Care (Signed)
Immediate Anesthesia Transfer of Care Note  Patient: Jill Shaw  Procedure(s) Performed: HYSTEROSCOPY INTRAUTERINE DEVICE (IUD) INSERTION (Vagina ) DILATATION AND CURETTAGE (Vagina )  Patient Location: PACU  Anesthesia Type:General  Level of Consciousness: awake, alert , and oriented  Airway & Oxygen Therapy: Patient Spontanous Breathing and Patient connected to nasal cannula oxygen  Post-op Assessment: Report given to RN and Post -op Vital signs reviewed and stable  Post vital signs: Reviewed and stable  Last Vitals:  Vitals Value Taken Time  BP 134/63 05/01/23 1245  Temp    Pulse 90 05/01/23 1248  Resp 16 05/01/23 1248  SpO2 96 % 05/01/23 1248  Vitals shown include unfiled device data.  Last Pain:  Vitals:   05/01/23 1130  TempSrc: Oral  PainSc: 9       Patients Stated Pain Goal: 1 (05/01/23 1130)  Complications: No notable events documented.

## 2023-05-01 NOTE — Discharge Instructions (Addendum)
Hysteroscopy Hysteroscopy is a procedure used to look inside a woman's womb (uterus). This may be done for various reasons, including: To look for tumors and other growths in the uterus. To evaluate abnormal bleeding, fibroid tumors, polyps, scar tissue, or uterine cancer. To determine why a woman is unable to get pregnant or has had repeated pregnancy losses (miscarriages). To locate an intrauterine device (IUD). To place a birth control device into the fallopian tubes. During this procedure, a thin, flexible tube with a small light and camera (hysteroscope) is used to examine the uterus. The camera sends images to a monitor in the room so that your health care provider can view the inside of your uterus. A hysteroscopy should be done right after a menstrual period. Tell a health care provider about: Any allergies you have. All medicines you are taking, including vitamins, herbs, eye drops, creams, and over-the-counter medicines. Any problems you or family members have had with anesthetic medicines. Any bleeding problems you have. Any surgeries you have had. Any medical conditions you have. Whether you are pregnant or may be pregnant. Whether you have been diagnosed with an sexually transmitted infection (STI) or you think you have an STI. What are the risks? Your health care provider will talk with you about risks. These may include: Excessive bleeding. Infection. Damage to the uterus or other structures or organs. Allergic reaction to medicines or fluids that are used in the procedure. What happens before the procedure? When to stop eating and drinking Follow instructions from your health care provider about what you may eat and drink. These may include: 8 hours before your procedure Stop eating most foods. Do not eat meat, fried foods, or fatty foods. Eat only light foods, such as toast or crackers. All liquids are okay except energy drinks and alcohol. 6 hours before your  procedure Stop eating. Drink only clear liquids, such as water, clear fruit juice, black coffee, plain tea, and sports drinks. Do not drink energy drinks or alcohol. 2 hours before your procedure Stop drinking all liquids. You may be allowed to take medicines with small sips of water. If you do not follow your health care provider's instructions, your procedure may be delayed or canceled. Medicines Ask your health care provider about: Changing or stopping your regular medicines. These include any diabetes medicines or blood thinners you take. Taking medicines such as aspirin and ibuprofen. These medicines can thin your blood. Do not take them unless your health care provider tells you to. Taking over-the-counter medicines, vitamins, herbs, and supplements. Medicine may be placed in your cervix the day before the procedure. This medicine causes the cervix to open (dilate). The larger opening makes it easier for the hysteroscope to be inserted into the uterus during the procedure. General instructions Ask your health care provider: What steps will be taken to help prevent infection. These steps may include: Washing skin with a soap that kills germs. Taking antibiotic medicine. Do not use any products that contain nicotine or tobacco for at least 4 weeks before the procedure. These products include cigarettes, chewing tobacco, and vaping devices, such as e-cigarettes. If you need help quitting, ask your health care provider. If you will be going home right after the procedure, plan to have a responsible adult: Take you home from the hospital or clinic. You will not be allowed to drive. Care for you for the time you are told. Empty your bladder before the procedure begins. What happens during the procedure? An IV will be  inserted into one of your veins. You may be given: A sedative. This helps you relax. Anesthesia. This will: Numb certain areas of your body. Make you fall asleep for  surgery. A hysteroscope will be inserted through your vagina and into your uterus. Air or fluid will be used to enlarge your uterus to allow your health care provider to see it better. The amount of fluid used will be carefully checked throughout the procedure. In some cases, tissue may be gently scraped from inside the uterus and sent to a lab for testing (biopsy). The procedure may vary among health care providers and hospitals. What happens after the procedure? Your blood pressure, heart rate, breathing rate, and blood oxygen level will be monitored until you leave the hospital or clinic. You may have cramps. You may be given medicines for this. You may have bleeding, which may vary from light spotting to menstrual-like bleeding. This is normal. If you had a biopsy, it is up to you to get the results. Ask your health care provider, or the department that is doing the procedure, when your results will be ready. Summary Hysteroscopy is a procedure that is used to look inside a woman's womb (uterus). After the procedure, you may have bleeding, which varies from light spotting to menstrual-like bleeding. This is normal. You may also have cramps. Plan to have a responsible adult take you home from the hospital or clinic. If you had a biopsy, it is up to you to get the results. Ask your health care provider, or the department that is doing the procedure, when your results will be ready. This information is not intended to replace advice given to you by your health care provider. Make sure you discuss any questions you have with your health care provider. Document Revised: 01/16/2022 Document Reviewed: 01/16/2022 Elsevier Patient Education  2024 [employer]     Post Anesthesia Home Care Instructions  Activity: Get plenty of rest for the remainder of the day. A responsible individual must stay with you for 24 hours following the procedure.  For the next 24 hours, DO NOT: -Drive a  car -Advertising copywriter -Drink alcoholic beverages -Take any medication unless instructed by your physician -Make any legal decisions or sign important papers.  Meals: Start with liquid foods such as gelatin or soup. Progress to regular foods as tolerated. Avoid greasy, spicy, heavy foods. If nausea and/or vomiting occur, drink only clear liquids until the nausea and/or vomiting subsides. Call your physician if vomiting continues.  Special Instructions/Symptoms: Your throat may feel dry or sore from the anesthesia or the breathing tube placed in your throat during surgery. If this causes discomfort, gargle with warm salt water. The discomfort should disappear within 24 hours.

## 2023-05-01 NOTE — Anesthesia Postprocedure Evaluation (Signed)
Anesthesia Post Note  Patient: Jill Shaw  Procedure(s) Performed: HYSTEROSCOPY INTRAUTERINE DEVICE (IUD) INSERTION (Vagina ) DILATATION AND CURETTAGE (Vagina )     Patient location during evaluation: PACU Anesthesia Type: General Level of consciousness: awake and alert Pain management: pain level controlled Vital Signs Assessment: post-procedure vital signs reviewed and stable Respiratory status: spontaneous breathing, nonlabored ventilation, respiratory function stable and patient connected to nasal cannula oxygen Cardiovascular status: blood pressure returned to baseline and stable Postop Assessment: no apparent nausea or vomiting Anesthetic complications: no   No notable events documented.  Last Vitals:  Vitals:   05/01/23 1400 05/01/23 1435  BP: (!) 141/67 135/75  Pulse: 80 83  Resp: 18   Temp:  36.7 C  SpO2: 97% 94%    Last Pain:  Vitals:   05/01/23 1435  TempSrc:   PainSc: 5                  Trevor Iha

## 2023-05-02 ENCOUNTER — Other Ambulatory Visit: Payer: Self-pay

## 2023-05-02 ENCOUNTER — Encounter (HOSPITAL_BASED_OUTPATIENT_CLINIC_OR_DEPARTMENT_OTHER): Payer: Self-pay | Admitting: Obstetrics and Gynecology

## 2023-05-02 ENCOUNTER — Encounter: Payer: Self-pay | Admitting: Obstetrics and Gynecology

## 2023-05-02 LAB — SURGICAL PATHOLOGY

## 2023-05-06 ENCOUNTER — Encounter: Payer: Self-pay | Admitting: Physical Medicine and Rehabilitation

## 2023-05-07 ENCOUNTER — Encounter: Payer: Self-pay | Admitting: Obstetrics and Gynecology

## 2023-05-07 ENCOUNTER — Encounter (HOSPITAL_BASED_OUTPATIENT_CLINIC_OR_DEPARTMENT_OTHER): Payer: Medicare Other | Admitting: Physical Medicine and Rehabilitation

## 2023-05-07 ENCOUNTER — Encounter: Payer: Self-pay | Admitting: Physical Medicine and Rehabilitation

## 2023-05-07 DIAGNOSIS — N939 Abnormal uterine and vaginal bleeding, unspecified: Secondary | ICD-10-CM

## 2023-05-07 DIAGNOSIS — R262 Difficulty in walking, not elsewhere classified: Secondary | ICD-10-CM | POA: Diagnosis not present

## 2023-05-07 DIAGNOSIS — G8918 Other acute postprocedural pain: Secondary | ICD-10-CM | POA: Diagnosis not present

## 2023-05-07 NOTE — Progress Notes (Unsigned)
Subjective:    Patient ID: Jill Shaw, female    DOB: 08/06/1976, 47 y.o.   MRN: [id number]   HPI:   An audio/video tele-health visit is felt to be the most appropriate encounter for this patient at this time. This is a follow up tele-visit via phone. The patient is at home. MD is at office. Prior to scheduling this appointment, our staff discussed the limitations of evaluation and management by telemedicine and the availability of in-person appointments. The patient expressed understanding and agreed to proceed.    Jill Shaw is a 47 y.o. female who returns for f/u of inflammatory gastritis, chronic pain, diabetic peripheral neuropathy, and insomnia.   1) Insomnia: -She continues to experience insomnia at night. Asks about Ambien and I discussed that this is an addictive medication and there are other safer options we can try first that can also help with her pain.  -She is currently not taking either of these medications, or Amitriptyline or Trazodone.  -She takes Requip for resltless legs but this does not help. -She is still sleeping very poorly -She does use screens before bed time  2) Back pain secondary to lumbar radiculitis.  -she says she did not mean to request an early refill today, that she said this wrong, she was trying to explain to [place] -she has received 2 warnings for marijuana in her urine sample as well as for not bringing the number of correct pills to her appointments -she asks if tizanidine can be taken more than 4 times per day -She states her pain is located in her lower back radiating into her bilateral lower extremities and bilateral knee pain. She denies falling. She rates her pain 9. Her current exercise regime is walking.  -[name] Morphine equivalent is 20.00 MME.  -She was hospitalized for serotonin syndrome after use of Savella and Cymbalta.  -Back pain has been severe -She requests a renewal of her handicap placard as she is unable to walk  200 feet without stopping to rest.  -Her pain has been better controlled with Norco -she would like to get another XR given the chronicity of her pain.  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 -she is concerned that 30 days will end Saturday and she is not able to pick up her oxycodone until Monday as per pharmacy  3) Diabetic peripheral neuropathy -she would like to try tens unit  -trying magnesium -she would like to repeat Qutenza today -has been having a hard time weaning off her medications -she is not getting enough relief from tylenol #3 -she needs refill of her oxycodone -she is currently very limited in her mobility and needs her son's assistance 24/7 at home, she needs a not indicating this for her son to give at work. -she asks whether Qutenza can be administered more frequently than every 3 months, it has been helping a lot.  -she finds great benefits in her legs and found benefit in her hands as well but she kept forgetting and touching her face and this caused her face to birn -she is visiting her mother who has a pneumonia and asks whether her medications can be filled early -she asks whether her oxycodone can be released early as she is going on vacation next week.  -She tolerated Qutenza well and this provided 2 weeks of good relief, benefit started right away.  -Her  peripheral neuropathy is worse in her bilateral lower extremities, especially the dorsum and soles of both feet, including the toes.  -now present in her hands as well- she has been dropping objects due to her neuropathy- present on the dorsal and ventral aspects of her hands -she is interested in following with a dietician -she is interested in medicines for her diabetes -she is trying to lose weight  -needs handicap placard  4) Right shoulder and arm pain -notes limited range of motion in her shoulder -does have pain radiating  into right arm from neck and arm feels heavy at times  5) Inflammatory gastritis -has been back and forth to the hospital as this has been really hurting her. She was given pain medication in the hospital. She was given medication for emesis.  -flexeril is making her sicker.  --patient says she has recently been diagnosed with this condition and pain has been severe. -she has her gastroenterologist recommended that she discuss with [place] increasing her Percocet to better control her pain -she is currently prescribed 3 Percocet per day but feels she would benefit from up to 5 per day  6) GERD -she can have severe reflex at times -she was started on pepcid for a short time period to help with this.   7) Morbid obesity -BMI 47.03 -weight is 274 lbs -she lose 40 lbs when she was sick since she could not tolerate food but she has gained a lot of this back -she asks about supplement she can take for weight loss  8) Migraines: -she has been having headaches and asks if she can get a scan of her head.   9) Diabetic gastroparesis: -this has been severe and she was recommended by ED physician to decrease her opioid usage, but she is nervous to do so because she is in so much pain -she asks about trying tylenol with codeine  10) Postoperative pain: -pain has been really intense -she is not receiving any postoperative pain medications -she has been in a lot of pain with her wean  -her surgeon will not prescribed medications since she goes to pain management clinic  Pain Inventory Average Pain 9 Pain Right Now 9 My pain is sharp, burning, tingling and aching  In the last 24 hours, has pain interfered with the following? General activity 0 Relation with others 0 Enjoyment of life 5 What TIME of day is your pain at its worst? morning , daytime, evening and night Sleep (in general) Poor  Pain is worse with: walking, bending, sitting, inactivity, standing and some activites Pain improves  with: heat/ice and medication Relief from Meds: 10  Family History  Problem Relation Age of Onset   Diabetes Mother    Hypertension Mother    Migraines Paternal Grandfather    Colon cancer Neg Hx    Esophageal cancer Neg Hx    Stomach cancer Neg Hx    Rectal cancer Neg Hx    Social History   Socioeconomic History   Marital status: Divorced    Spouse name: Not on file   Number of children: 3   Years of education: Not on file   Highest education level: Not on file  Occupational History   Occupation: unemployed  Tobacco Use   Smoking status: Some Days    Current packs/day: 0.00    Average packs/day: 0.3 packs/day for 26.0 years (6.5 ttl pk-yrs)    Types: Cigarettes    Start date: 09/13/1994    Last attempt to  quit: 09/13/2020    Years since quitting: 2.6   Smokeless tobacco: Never   Tobacco comments:    4-5 cigarettes/day  Vaping Use   Vaping status: Never Used  Substance and Sexual Activity   Alcohol use: No   Drug use: No   Sexual activity: Not Currently    Birth control/protection: Injection  Other Topics Concern   Not on file  Social History Narrative   Right Handed   Lives in a one story apartment, but lives on the second floor   Drinks caffeine once in awhile   Social Determinants of Health   Financial Resource Strain: Low Risk  (03/18/2023)   Overall Financial Resource Strain (CARDIA)    Difficulty of Paying Living Expenses: Not very hard  Food Insecurity: No Food Insecurity (03/18/2023)   Hunger Vital Sign    Worried About Running Out of Food in the Last Year: Never true    Ran Out of Food in the Last Year: Never true  Transportation Needs: No Transportation Needs (03/19/2023)   PRAPARE - Administrator, Civil Service (Medical): No    Lack of Transportation (Non-Medical): No  Physical Activity: Inactive (03/18/2023)   Exercise Vital Sign    Days of Exercise per Week: 0 days    Minutes of Exercise per Session: 0 min  Stress: Stress Concern  Present (03/18/2023)   [employer] of Occupational Health - Occupational Stress Questionnaire    Feeling of Stress : To some extent  Social Connections: Moderately Isolated (03/18/2023)   Social Connection and Isolation Panel [NHANES]    Frequency of Communication with Friends and Family: More than three times a week    Frequency of Social Gatherings with Friends and Family: Three times a week    Attends Religious Services: 1 to 4 times per year    Active Member of Clubs or Organizations: No    Attends Banker Meetings: Never    Marital Status: Divorced   Past Surgical History:  Procedure Laterality Date   CATARACT EXTRACTION W/ INTRAOCULAR LENS IMPLANT Left    CESAREAN SECTION  2001   for twins   DILATION AND CURETTAGE OF UTERUS N/A 08/20/2019   Procedure: DILATATION AND CURETTAGE;  Surgeon: Allie Bossier, MD;  Location: [facility];  Service: Gynecology;  Laterality: N/A;   DILATION AND CURETTAGE OF UTERUS  05/01/2023   Procedure: DILATATION AND CURETTAGE;  Surgeon: Lorriane Shire, MD;  Location: [facility];  Service: Gynecology;;   ENDOMETRIAL ABLATION N/A 08/20/2019   Procedure: Maren Reamer Ablation;  Surgeon: Allie Bossier, MD;  Location: [facility];  Service: Gynecology;  Laterality: N/A;   EYE SURGERY Bilateral    laser right and cataract removed left eye   HYSTEROSCOPY N/A 05/01/2023   Procedure: HYSTEROSCOPY;  Surgeon: Lorriane Shire, MD;  Location: [facility];  Service: Gynecology;  Laterality: N/A;   INTRAUTERINE DEVICE (IUD) INSERTION N/A 05/01/2023   Procedure: INTRAUTERINE DEVICE (IUD) INSERTION;  Surgeon: Lorriane Shire, MD;  Location:  SURGERY CENTER;  Service: Gynecology;  Laterality: N/A;   LEFT HEART CATH AND CORONARY ANGIOGRAPHY N/A 08/30/2021   Procedure: LEFT HEART CATH AND CORONARY ANGIOGRAPHY;  Surgeon: Elder Negus, MD;  [facility]: [facility];  Service: Cardiovascular;  Laterality: N/A;    RADIOLOGY WITH ANESTHESIA N/A 09/16/2019   Procedure: MRI WITH ANESTHESIA   L SPINE WITHOUT CONTRAST, T SPINE WITHOUT CONTRAST , CERVICAL WITHOUT CONTRAST;  Surgeon: Radiologist, Medication, MD;  Location: [facility];  Service: Radiology;  Laterality: N/A;   TUBAL LIGATION     interval BTL   UPPER GI ENDOSCOPY  07/2017   Past Surgical History:  Procedure Laterality Date   CATARACT EXTRACTION W/ INTRAOCULAR LENS IMPLANT Left    CESAREAN SECTION  2001   for twins   DILATION AND CURETTAGE OF UTERUS N/A 08/20/2019   Procedure: DILATATION AND CURETTAGE;  Surgeon: Allie Bossier, MD;  Location: [facility];  Service: Gynecology;  Laterality: N/A;   DILATION AND CURETTAGE OF UTERUS  05/01/2023   Procedure: DILATATION AND CURETTAGE;  Surgeon: Lorriane Shire, MD;  Location: [facility];  Service: Gynecology;;   ENDOMETRIAL ABLATION N/A 08/20/2019   Procedure: Maren Reamer Ablation;  Surgeon: Allie Bossier, MD;  [facility];  Service: Gynecology;  Laterality: N/A;   EYE SURGERY Bilateral    laser right and cataract removed left eye   HYSTEROSCOPY N/A 05/01/2023   Procedure: HYSTEROSCOPY;  Surgeon: Lorriane Shire, MD;  Location: [facility];  Service: Gynecology;  Laterality: N/A;   INTRAUTERINE DEVICE (IUD) INSERTION N/A 05/01/2023   Procedure: INTRAUTERINE DEVICE (IUD) INSERTION;  Surgeon: Lorriane Shire, MD;  Location: [facility];  Service: Gynecology;  Laterality: N/A;   LEFT HEART CATH AND CORONARY ANGIOGRAPHY N/A 08/30/2021   Procedure: LEFT HEART CATH AND CORONARY ANGIOGRAPHY;  Surgeon: Elder Negus, MD;  [facility];  Service: Cardiovascular;  Laterality: N/A;   RADIOLOGY WITH ANESTHESIA N/A 09/16/2019   Procedure: MRI WITH ANESTHESIA   L SPINE WITHOUT CONTRAST, T SPINE WITHOUT CONTRAST , CERVICAL WITHOUT CONTRAST;  Surgeon: Radiologist, Medication, MD;  Location: [facility];  Service: Radiology;  Laterality: N/A;   TUBAL LIGATION      interval BTL   UPPER GI ENDOSCOPY  07/2017   Past Medical History:  Diagnosis Date   Abnormal uterine bleeding (AUB)    Arthritis    knees, hands   Atypical chest pain    cardiology--- dr Rosemary Holms did cardiac cath 08-30-2021 showed minimal luminal irregularity involving LAD all other coronaries w/ normal  flow   Chronic diastolic (congestive) heart failure [facility])    cardiologist---- dr Rosemary Holms;  preserved ef   Chronic iron deficiency anemia    Chronic pain syndrome    followed by pain management--- dr Carlis Abbott   CKD (chronic kidney disease), stage III (HCC)    Diabetic gastroparesis (HCC)    Diabetic peripheral neuropathy (HCC)    Fibromyalgia    GAD (generalized anxiety disorder)    Generalized abdominal pain    GERD (gastroesophageal reflux disease)    History of acute renal failure 03/21/2023   admission in epic due to N/V/D due ot severe sepsis POA due to UTI   History of chronic gastritis    inflammatory   History of diabetic ketoacidosis    multiple admission's last 3 in epic 06/ 2022;  01/ 2022;   09/ 2021   History of seizure 01/2016   hypoglycemic seizure   History of vertebral compression fracture 12/2020   T11 -- T12 & L1   Hyperlipidemia    Hypertension    followed by pcp   Insulin dependent type 2 diabetes mellitus (HCC)    uncontrolled,  followed by pcp   Irritable bowel syndrome with constipation    MDD (major depressive disorder)    Moderate COPD (chronic obstructive pulmonary disease) (HCC)    pulmology--- dr Tonia Brooms   Moderate persistent asthma    Sickle cell trait (  HCC)    Vitamin D deficiency 10/2019   Wears glasses    There were no vitals taken for this visit.  Opioid Risk Score:   Fall Risk Score:  `1  Depression screen [facility] 2/9     04/23/2023   11:51 AM 04/03/2023    2:33 PM 03/21/2023    1:34 PM 02/19/2023   10:00 AM 01/25/2023   10:52 AM 01/04/2023    3:22 PM 12/25/2022   10:18 AM  Depression screen PHQ 2/9  Decreased Interest 0 2 1 0 0  0 2  Down, Depressed, Hopeless 0 2 1 0 0 0 2  PHQ - 2 Score 0 4 2 0 0 0 4  Altered sleeping  2    3   Tired, decreased energy  2    3   Change in appetite  2       Feeling bad or failure about yourself   0       Trouble concentrating  2       Moving slowly or fidgety/restless  0       Suicidal thoughts  0       PHQ-9 Score  12    6      Review of Systems  Constitutional:  Positive for diaphoresis and unexpected weight change.  Respiratory:  Positive for shortness of breath and wheezing.   Gastrointestinal:  Positive for abdominal pain, constipation and nausea.  Musculoskeletal:  Positive for arthralgias, back pain, gait problem and myalgias.  Neurological:  Positive for dizziness, weakness and numbness.  Hematological:  Bruises/bleeds easily.  Psychiatric/Behavioral:  Positive for confusion and decreased concentration. The patient is nervous/anxious.   All other systems reviewed and are negative.      Objective:  Not performed     Assessment & Plan:  1. Lumbar Radiculitis: Continue current medication regimen. Continue HEP as Tolerated. Provided with handicap placard. XR ordered. Note for work provided via Clinical cytogeneticist.  2. Fibromyalgia: Continue HEP as Tolerated. Continue current Medication regimen. Continue to Monitor.  3. Bilateral Knee Pain: Right knee is currently worst, XR ordered and will call with results.  Continue to Monitor.  4. Chronic Pain Syndrome: Continue Percocet. Discussed that we cannot send early refills if medications are lost. Increase percocet to 6 tabs per day prn. Will send final warning letter to patient today to say that we do not permit early refills, discussed this with her pharmacy as well -provided note explaining her inability to work due to her pain -discussed with patient when she last picked up oxycodone and when she is due to pick up next refill, called pharmacy to confirm  5. Insomnia: -Try to go outside near sunrise -Get exercise during the day.   -Discussed good sleep hygiene: turning off all devices an hour before bedtime.  -Chamomile tea with dinner.  -Melatonin did for help. -warm bath or shower before bed. -apply lavender oil for forehead at night  6. Diabetic peripheral neuropathy -provided note for son to give to work to indicate that patient needs his assistance 24/7 due to her mobility deficits -checked with Qutenza rep and medicaid/medicare will not cover Qutenza more frequently than every 3 months  -refilled tizanidine -Discussed Qutenza as an option for neuropathic pain control. Discussed that this is a capsaicin patch, stronger than capsaicin cream. Discussed that it is currently approved for diabetic peripheral neuropathy and post-herpetic neuralgia, but that it has also shown benefit in treating other forms of neuropathy. Provided patient with link  to site to learn more about the patch: https://www.clark.biz/. Discussed that the patch would be placed in office and benefits usually last 3 months. Discussed that unintended exposure to capsaicin can cause severe irritation of eyes, mucous membranes, respiratory tract, and skin, but that Qutenza is a local treatment and does not have the systemic side effects of other nerve medications. Discussed that there may be pain, itching, erythema, and decreased sensory function associated with the application of Qutenza. Side effects usually subside within 1 week. A cold pack of analgesic medications can help with these side effects. Blood pressure can also be increased due to pain associated with administration of the patch.   4 patches of Qutenza was applied to the area of pain. Ice packs were applied during the procedure to ensure patient comfort. Blood pressure was monitored every 15 minutes. The patient tolerated the procedure well. Post-procedure instructions were given and follow-up has been scheduled.    -discussed positive response to last Qutenza treatment  -switch from  tylenol #3 to tylenol #4 TID PRN given lack of relief from former  -discussed benefit from TENS unit in office  Prescribed Zynex Nexwave  7. Right sided shoulder and arm pain -discussed that could be a component or frozen shoulder and/or cervical radiculitis -will obtain right shoulder XR and cervical spine XR and call when results are available  8. Obesity: -Educated that current weight is 274 lbs and current BMI is 47.03 -shared foods that can assist in weight loss: 1) leafy greens- high in fiber and nutrients 2) dark chocolate- improves metabolism (if prefer sweetened, best to sweeten with honey instead of sugar).  3) cruciferous vegetables- high in fiber and protein 4) full fat yogurt: high in healthy fat, protein, calcium, and probiotics 5) apples- high in a variety of phytochemicals 6) nuts- high in fiber and protein that increase feelings of fullness 7) grapefruit: rich in nutrients, antioxidants, and fiber (not to be taken with anticoagulation) 8) beans- high in protein and fiber 9) salmon- has high quality protein and healthy fats 10) green tea- rich in polyphenols 11) eggs- rich in choline and vitamin D 12) tuna- high protein, boosts metabolism 13) avocado- decreases visceral abdominal fat 14) chicken (pasture raised): high in protein and iron 15) blueberries- reduce abdominal fat and cholesterol 16) whole grains- decreases calories retained during digestion, speeds metabolism 17) chia seeds- curb appetite 18) chilies- increases fat metabolism  -Discussed supplements that can be used:  1) Metatrim 400mg  BID 30 minutes before breakfast and dinner  2) Sphaeranthus indicus and Garcinia mangostana (combinations of these and #1 can be found in capsicum and zychrome  3) green coffee bean extract 400mg  twice per day or Irvingia (african mango) 150 to 300mg  twice per day.    9) Gastroesophageal reflux disease  -discussed the interaction between Tizanidine and Pepcid and that  their additive effects could cause hypotension and bradycardia. -recommended eating small meals -recommended avoiding acidic and spicy foods -recommended apple cider vinegar in a cup of water before meals.   10) Inflammatory gastritis: -recommended following up with GI  11) Migraines: -Head CT ordered  12) Diabetic gastroparesis: -wean Percocet to 5 tabs per day -add tylenol with codeine TID to replace this, assist with wean -discussed that this has been severe  5 minutes spent in discussion of her postoperative pain, let her know that we will contact her OBGYN's office to let them know we allow other practices to prescribe post-operative pain medications and that we are currently weaning  her off her current opioids

## 2023-05-08 ENCOUNTER — Encounter: Payer: Self-pay | Admitting: Physical Medicine and Rehabilitation

## 2023-05-09 ENCOUNTER — Other Ambulatory Visit: Payer: Self-pay

## 2023-05-10 ENCOUNTER — Telehealth: Payer: Self-pay | Admitting: General Practice

## 2023-05-10 ENCOUNTER — Telehealth: Payer: Self-pay

## 2023-05-10 ENCOUNTER — Encounter: Payer: Self-pay | Admitting: Physical Medicine and Rehabilitation

## 2023-05-10 ENCOUNTER — Other Ambulatory Visit: Payer: Self-pay

## 2023-05-10 NOTE — Telephone Encounter (Signed)
Please correct and initial page 3 and fax to [phone]. Please call after completion. Eirene will pick up her copy here in the office.  (Form have been placed in Dr. Richmond Campbell office).  Thank you.

## 2023-05-10 NOTE — Telephone Encounter (Signed)
Patient called and left message on nurse voicemail line requesting a call back about a new medication and to discuss pain medicine for her vaginal pain. Please see office note from 7/22.

## 2023-05-11 ENCOUNTER — Other Ambulatory Visit: Payer: Self-pay

## 2023-05-11 ENCOUNTER — Emergency Department (HOSPITAL_COMMUNITY)
Admission: EM | Admit: 2023-05-11 | Discharge: 2023-05-12 | Disposition: A | Discharge status: Home / Self Care | Payer: Medicare Other | Attending: Emergency Medicine | Admitting: Emergency Medicine

## 2023-05-11 ENCOUNTER — Encounter (HOSPITAL_COMMUNITY): Payer: Self-pay

## 2023-05-11 ENCOUNTER — Other Ambulatory Visit: Payer: Self-pay | Admitting: Physical Medicine and Rehabilitation

## 2023-05-11 ENCOUNTER — Encounter: Payer: Self-pay | Admitting: Pharmacist

## 2023-05-11 ENCOUNTER — Other Ambulatory Visit: Payer: Self-pay | Admitting: Registered Nurse

## 2023-05-11 DIAGNOSIS — R197 Diarrhea, unspecified: Secondary | ICD-10-CM | POA: Insufficient documentation

## 2023-05-11 DIAGNOSIS — R000 Tachycardia, unspecified: Secondary | ICD-10-CM | POA: Insufficient documentation

## 2023-05-11 DIAGNOSIS — Z79899 Other long term (current) drug therapy: Secondary | ICD-10-CM | POA: Diagnosis not present

## 2023-05-11 DIAGNOSIS — R1084 Generalized abdominal pain: Secondary | ICD-10-CM | POA: Insufficient documentation

## 2023-05-11 DIAGNOSIS — E1165 Type 2 diabetes mellitus with hyperglycemia: Secondary | ICD-10-CM | POA: Diagnosis not present

## 2023-05-11 DIAGNOSIS — R112 Nausea with vomiting, unspecified: Secondary | ICD-10-CM | POA: Insufficient documentation

## 2023-05-11 DIAGNOSIS — Z794 Long term (current) use of insulin: Secondary | ICD-10-CM | POA: Insufficient documentation

## 2023-05-11 DIAGNOSIS — M545 Low back pain, unspecified: Secondary | ICD-10-CM | POA: Insufficient documentation

## 2023-05-11 DIAGNOSIS — E11649 Type 2 diabetes mellitus with hypoglycemia without coma: Secondary | ICD-10-CM | POA: Insufficient documentation

## 2023-05-11 DIAGNOSIS — E162 Hypoglycemia, unspecified: Secondary | ICD-10-CM

## 2023-05-11 DIAGNOSIS — I10 Essential (primary) hypertension: Secondary | ICD-10-CM | POA: Insufficient documentation

## 2023-05-11 LAB — COMPREHENSIVE METABOLIC PANEL
ALT: 7 U/L (ref 0–44)
AST: 16 U/L (ref 15–41)
Albumin: 3.6 g/dL (ref 3.5–5.0)
Alkaline Phosphatase: 75 U/L (ref 38–126)
Anion gap: 10 (ref 5–15)
BUN: 6 mg/dL (ref 6–20)
CO2: 21 mmol/L — ABNORMAL LOW (ref 22–32)
Calcium: 9.1 mg/dL (ref 8.9–10.3)
Chloride: 107 mmol/L (ref 98–111)
Creatinine, Ser: 1.17 mg/dL — ABNORMAL HIGH (ref 0.44–1.00)
GFR, Estimated: 58 mL/min — ABNORMAL LOW (ref >60)
Glucose, Bld: 176 mg/dL — ABNORMAL HIGH (ref 70–99)
Potassium: 4.1 mmol/L (ref 3.5–5.1)
Sodium: 138 mmol/L (ref 135–145)
Total Bilirubin: 0.3 mg/dL (ref 0.3–1.2)
Total Protein: 6.8 g/dL (ref 6.5–8.1)

## 2023-05-11 LAB — CBC WITH DIFFERENTIAL/PLATELET
Abs Immature Granulocytes: 0.03 10*3/uL (ref 0.00–0.07)
Basophils Absolute: 0 10*3/uL (ref 0.0–0.1)
Basophils Relative: 0 %
Eosinophils Absolute: 0.4 10*3/uL (ref 0.0–0.5)
Eosinophils Relative: 4 %
HCT: 39 % (ref 36.0–46.0)
Hemoglobin: 12.7 g/dL (ref 12.0–15.0)
Immature Granulocytes: 0 %
Lymphocytes Relative: 25 %
Lymphs Abs: 2.6 10*3/uL (ref 0.7–4.0)
MCH: 27.4 pg (ref 26.0–34.0)
MCHC: 32.6 g/dL (ref 30.0–36.0)
MCV: 84.2 fL (ref 80.0–100.0)
Monocytes Absolute: 0.4 10*3/uL (ref 0.1–1.0)
Monocytes Relative: 4 %
Neutro Abs: 6.8 10*3/uL (ref 1.7–7.7)
Neutrophils Relative %: 67 %
Platelets: 250 10*3/uL (ref 150–400)
RBC: 4.63 MIL/uL (ref 3.87–5.11)
RDW: 17.7 % — ABNORMAL HIGH (ref 11.5–15.5)
WBC: 10.2 10*3/uL (ref 4.0–10.5)
nRBC: 0 % (ref 0.0–0.2)

## 2023-05-11 LAB — URINALYSIS, W/ REFLEX TO CULTURE (INFECTION SUSPECTED)
Bacteria, UA: NONE SEEN
Bilirubin Urine: NEGATIVE
Glucose, UA: NEGATIVE mg/dL
Hgb urine dipstick: NEGATIVE
Ketones, ur: 5 mg/dL — AB
Leukocytes,Ua: NEGATIVE
Nitrite: NEGATIVE
Protein, ur: NEGATIVE mg/dL
Specific Gravity, Urine: 1.016 (ref 1.005–1.030)
pH: 6 (ref 5.0–8.0)

## 2023-05-11 LAB — LIPASE, BLOOD: Lipase: 22 U/L (ref 11–51)

## 2023-05-11 MED ORDER — OXYCODONE HCL 5 MG PO TABS
10.0000 mg | ORAL_TABLET | Freq: Once | ORAL | Status: AC
  Administered 2023-05-11: 10 mg via ORAL
  Filled 2023-05-11: qty 2

## 2023-05-11 MED ORDER — ONDANSETRON 4 MG PO TBDP
4.0000 mg | ORAL_TABLET | Freq: Once | ORAL | Status: AC
  Administered 2023-05-11: 4 mg via ORAL
  Filled 2023-05-11: qty 1

## 2023-05-11 NOTE — ED Provider Triage Note (Signed)
Emergency Medicine Provider Triage Evaluation Note  ARTERIA BOODOO , a 47 y.o. female  was evaluated in triage.  Pt complains of complains of nausea vomiting diarrhea pelvic pain and back pain.  Patient states "I am here all the time for this."  She also had an IUD inserted and states that she is having severe pain from her IUD.Marland Kitchen  Review of Systems  Positive: Back pain nausea vomiting diarrhea and pelvic pain Negative:   Physical Exam  BP (!) 124/101   Pulse (!) 101   Temp 98.3 F (36.8 C)   Resp (!) 22   SpO2 100%  Gen:   Awake, no distress   Resp:  Normal effort  MSK:   Moves extremities without difficulty  Other:    Medical Decision Making  Medically screening exam initiated at 9:50 PM.  Appropriate orders placed.  ELAYSIA PETTINATO was informed that the remainder of the evaluation will be completed by another provider, this initial triage assessment does not replace that evaluation, and the importance of remaining in the ED until their evaluation is complete.     Arthor Captain, PA-C 05/11/23 [id number]

## 2023-05-11 NOTE — ED Triage Notes (Signed)
Pt is coming in for chronic abd pain/pelvic pain that started yesterday, as well as chronic lower back pain, states both complaints started worsening 2 days ago. Pt took no meds before coming in.

## 2023-05-12 ENCOUNTER — Encounter (HOSPITAL_COMMUNITY): Payer: Self-pay | Admitting: Emergency Medicine

## 2023-05-12 ENCOUNTER — Other Ambulatory Visit: Payer: Self-pay

## 2023-05-12 ENCOUNTER — Emergency Department (HOSPITAL_COMMUNITY)
Admission: EM | Admit: 2023-05-12 | Discharge: 2023-05-13 | Disposition: A | Discharge status: Home / Self Care | Payer: Medicare Other | Source: Home / Self Care | Attending: Emergency Medicine | Admitting: Emergency Medicine

## 2023-05-12 DIAGNOSIS — R739 Hyperglycemia, unspecified: Secondary | ICD-10-CM | POA: Insufficient documentation

## 2023-05-12 DIAGNOSIS — R1110 Vomiting, unspecified: Secondary | ICD-10-CM | POA: Insufficient documentation

## 2023-05-12 DIAGNOSIS — M545 Low back pain, unspecified: Secondary | ICD-10-CM | POA: Insufficient documentation

## 2023-05-12 DIAGNOSIS — Z794 Long term (current) use of insulin: Secondary | ICD-10-CM | POA: Insufficient documentation

## 2023-05-12 DIAGNOSIS — R000 Tachycardia, unspecified: Secondary | ICD-10-CM | POA: Insufficient documentation

## 2023-05-12 DIAGNOSIS — R197 Diarrhea, unspecified: Secondary | ICD-10-CM | POA: Insufficient documentation

## 2023-05-12 DIAGNOSIS — R1084 Generalized abdominal pain: Secondary | ICD-10-CM | POA: Diagnosis not present

## 2023-05-12 DIAGNOSIS — R112 Nausea with vomiting, unspecified: Secondary | ICD-10-CM

## 2023-05-12 DIAGNOSIS — R109 Unspecified abdominal pain: Secondary | ICD-10-CM | POA: Insufficient documentation

## 2023-05-12 DIAGNOSIS — G8929 Other chronic pain: Secondary | ICD-10-CM

## 2023-05-12 LAB — HCG, SERUM, QUALITATIVE: Preg, Serum: NEGATIVE

## 2023-05-12 LAB — CBC
HCT: 39.2 % (ref 36.0–46.0)
Hemoglobin: 12.8 g/dL (ref 12.0–15.0)
MCH: 27.4 pg (ref 26.0–34.0)
MCHC: 32.7 g/dL (ref 30.0–36.0)
MCV: 83.8 fL (ref 80.0–100.0)
Platelets: 235 10*3/uL (ref 150–400)
RBC: 4.68 MIL/uL (ref 3.87–5.11)
RDW: 17.5 % — ABNORMAL HIGH (ref 11.5–15.5)
WBC: 10.4 10*3/uL (ref 4.0–10.5)
nRBC: 0 % (ref 0.0–0.2)

## 2023-05-12 LAB — URINALYSIS, ROUTINE W REFLEX MICROSCOPIC
Bacteria, UA: NONE SEEN
Bilirubin Urine: NEGATIVE
Glucose, UA: >=500 mg/dL — AB
Hgb urine dipstick: NEGATIVE
Ketones, ur: NEGATIVE mg/dL
Leukocytes,Ua: NEGATIVE
Nitrite: NEGATIVE
Protein, ur: NEGATIVE mg/dL
Specific Gravity, Urine: 1.024 (ref 1.005–1.030)
pH: 5 (ref 5.0–8.0)

## 2023-05-12 LAB — COMPREHENSIVE METABOLIC PANEL
ALT: 8 U/L (ref 0–44)
AST: 18 U/L (ref 15–41)
Albumin: 3.6 g/dL (ref 3.5–5.0)
Alkaline Phosphatase: 73 U/L (ref 38–126)
Anion gap: 13 (ref 5–15)
BUN: 8 mg/dL (ref 6–20)
CO2: 16 mmol/L — ABNORMAL LOW (ref 22–32)
Calcium: 9 mg/dL (ref 8.9–10.3)
Chloride: 105 mmol/L (ref 98–111)
Creatinine, Ser: 1.43 mg/dL — ABNORMAL HIGH (ref 0.44–1.00)
GFR, Estimated: 46 mL/min — ABNORMAL LOW (ref >60)
Glucose, Bld: 351 mg/dL — ABNORMAL HIGH (ref 70–99)
Potassium: 4.2 mmol/L (ref 3.5–5.1)
Sodium: 134 mmol/L — ABNORMAL LOW (ref 135–145)
Total Bilirubin: 0.5 mg/dL (ref 0.3–1.2)
Total Protein: 6.6 g/dL (ref 6.5–8.1)

## 2023-05-12 LAB — LIPASE, BLOOD: Lipase: 23 U/L (ref 11–51)

## 2023-05-12 LAB — CBG MONITORING, ED
Glucose-Capillary: 104 mg/dL — ABNORMAL HIGH (ref 70–99)
Glucose-Capillary: 124 mg/dL — ABNORMAL HIGH (ref 70–99)
Glucose-Capillary: 135 mg/dL — ABNORMAL HIGH (ref 70–99)
Glucose-Capillary: 355 mg/dL — ABNORMAL HIGH (ref 70–99)
Glucose-Capillary: 49 mg/dL — ABNORMAL LOW (ref 70–99)
Glucose-Capillary: 51 mg/dL — ABNORMAL LOW (ref 70–99)

## 2023-05-12 MED ORDER — DROPERIDOL 2.5 MG/ML IJ SOLN
2.5000 mg | Freq: Once | INTRAMUSCULAR | Status: AC
  Administered 2023-05-12: 2.5 mg via INTRAVENOUS
  Filled 2023-05-12: qty 2

## 2023-05-12 MED ORDER — HYDROMORPHONE HCL 1 MG/ML IJ SOLN
1.0000 mg | Freq: Once | INTRAMUSCULAR | Status: AC
  Administered 2023-05-12: 1 mg via INTRAVENOUS
  Filled 2023-05-12: qty 1

## 2023-05-12 MED ORDER — HYDROMORPHONE HCL 2 MG PO TABS
2.0000 mg | ORAL_TABLET | Freq: Once | ORAL | Status: AC
  Administered 2023-05-12: 2 mg via ORAL
  Filled 2023-05-12: qty 1

## 2023-05-12 MED ORDER — METOCLOPRAMIDE HCL 10 MG PO TABS: 10.0000 mg | ORAL_TABLET | Freq: Three times a day (TID) | ORAL | 0 refills | Status: DC | PRN

## 2023-05-12 MED ORDER — SIMETHICONE 180 MG PO CAPS: 1.0000 | ORAL_CAPSULE | Freq: Three times a day (TID) | ORAL | 0 refills | Status: DC | PRN

## 2023-05-12 MED ORDER — METOCLOPRAMIDE HCL 5 MG/ML IJ SOLN
10.0000 mg | Freq: Once | INTRAMUSCULAR | Status: AC
  Administered 2023-05-12: 10 mg via INTRAVENOUS
  Filled 2023-05-12: qty 2

## 2023-05-12 MED ORDER — LACTATED RINGERS IV BOLUS
1000.0000 mL | Freq: Once | INTRAVENOUS | Status: AC
  Administered 2023-05-12: 1000 mL via INTRAVENOUS

## 2023-05-12 MED ORDER — DEXTROSE 50 % IV SOLN
1.0000 | Freq: Once | INTRAVENOUS | Status: AC
  Administered 2023-05-12: 50 mL via INTRAVENOUS
  Filled 2023-05-12: qty 50

## 2023-05-12 NOTE — ED Triage Notes (Signed)
Pt c/o abdominal pain and back pain with N/V. Pt seen yesterday for the same.

## 2023-05-12 NOTE — ED Provider Notes (Signed)
[place] EMERGENCY DEPARTMENT AT [facility] Provider Note   CSN: [id number] Arrival date & time: 05/11/23  2115     History  Chief Complaint  Patient presents with   Abdominal Pain    Jill Shaw is a 47 y.o. female.  The history is provided by the patient and medical records.  Abdominal Pain Jill Shaw is a 47 y.o. female who presents to the Emergency Department complaining of abdominal pain.  She presents to the emergency department for evaluation of generalized abdominal pain that started yesterday.  Pain is throughout her entire abdomen and constant in nature.  She reports associated vomiting and diarrhea with 5-6 episodes of emesis and 8 episodes of diarrhea.  No associated fever.  She has not been taking her diuretic because she has been vomiting.  She did take her insulin this morning.  She has a history of multiple similar episodes in the past.     Home Medications Prior to Admission medications   Medication Sig Start Date End Date Taking? Authorizing Provider  metoCLOPramide (REGLAN) 10 MG tablet Take 1 tablet (10 mg total) by mouth every 8 (eight) hours as needed for nausea. 05/12/23  Yes Tilden Fossa, MD  Simethicone 180 MG CAPS Take 1 capsule (180 mg total) by mouth 3 (three) times daily as needed. 05/12/23  Yes Tilden Fossa, MD  Accu-Chek Softclix Lancets lancets USE TO TEST AS DIRECTED UP TO FOUR TIMES DAILY. 03/06/22   Shamleffer, Konrad Dolores, MD  albuterol (PROVENTIL) (2.5 MG/3ML) 0.083% nebulizer solution Inhale into the lungs. 09/27/20   [provider]  albuterol (VENTOLIN HFA) 108 (90 Base) MCG/ACT inhaler Inhale 2 puffs into the lungs every 6 (six) hours as needed for wheezing or shortness of breath. 07/18/21   Luciano Cutter, MD  B-D ULTRAFINE III SHORT PEN 31G X 8 MM MISC USE AS DIRECTED EVERY MORNING, NOON, EVENING, AND AT BEDTIME 07/10/22   Shamleffer, Konrad Dolores, MD  Continuous Blood Gluc Sensor (FREESTYLE LIBRE 3  SENSOR) MISC Place 1 sensor on the skin every 14 days. Use to check glucose continuously Patient not taking: Reported on 05/01/2023 07/06/22   Ivonne Andrew, NP  Dexlansoprazole 30 MG capsule DR TAKE 1 CAPSULE (30 MG TOTAL) BY MOUTH DAILY. NEEDS OFFICE VISIT FOR ADDITIONAL REFILLS 03/26/23   Ivonne Andrew, NP  dicyclomine (BENTYL) 20 MG tablet Take 1 tablet (20 mg total) by mouth 2 (two) times daily. 02/16/23   Ivonne Andrew, NP  furosemide (LASIX) 40 MG tablet TAKE 1 TABLET(40 MG) BY MOUTH TWICE DAILY Patient taking differently: Take 40 mg by mouth 2 (two) times daily. 01/19/23   Patwardhan, Manish J, MD  glucose blood (ACCU-CHEK GUIDE) test strip USE TO TEST BLOOD GLUCOSE AS DIRECTED UP TO 4 TIMES DAILY 11/14/22   Ivonne Andrew, NP  HUMALOG KWIKPEN 200 UNIT/ML KwikPen Inject 50 Units into the skin 3 (three) times daily before meals. 03/19/23   Ivonne Andrew, NP  hydrochlorothiazide (MICROZIDE) 12.5 MG capsule TAKE 1 CAPSULE(12.5 MG) BY MOUTH DAILY Patient taking differently: Take 12.5 mg by mouth daily. 12/14/21   Cantwell, Celeste C, PA-C  HYDROmorphone (DILAUDID) 2 MG tablet Take 1 tablet (2 mg total) by mouth every 4 (four) hours as needed for up to 10 doses for severe pain. 04/15/23   Jeanelle Malling, PA  losartan (COZAAR) 100 MG tablet TAKE 1 TABLET BY MOUTH EVERY DAY 04/30/23   Ivonne Andrew, NP  metoCLOPramide (REGLAN) 10 MG  tablet Take 1 tablet (10 mg total) by mouth every 6 (six) hours as needed for nausea. 01/17/23   Al Decant, PA-C  metoprolol succinate (TOPROL-XL) 100 MG 24 hr tablet Take 100 mg by mouth daily. Take with or immediately following a meal.    [provider]  nitroGLYCERIN (NITROSTAT) 0.4 MG SL tablet Place 1 tablet (0.4 mg total) under the tongue every 5 (five) minutes as needed for chest pain. Additional refills to be filled by PCP, patient aware 04/26/23   Yates Decamp, MD  norethindrone (AYGESTIN) 5 MG tablet Take 1 tablet (5 mg total) by mouth 3 (three)  times daily. Patient not taking: Reported on 05/01/2023 04/30/23   Lorriane Shire, MD  ondansetron (ZOFRAN) 4 MG tablet Take 1 tablet (4 mg total) by mouth every 8 (eight) hours as needed for nausea or vomiting. 04/18/23   Ivonne Andrew, NP  oxyCODONE-acetaminophen (PERCOCET) 10-325 MG tablet Take 1 tablet by mouth every 6 (six) hours as needed for pain. May take a extra tablet 10 days out of the month 04/18/23   Jones Bales, NP  Pimozide 1 MG TABS Take by mouth. 02/10/23   [provider]  potassium chloride SA (KLOR-CON M) 20 MEQ tablet Take 1 tablet (20 mEq total) by mouth daily. 03/19/23   Ivonne Andrew, NP  rosuvastatin (CRESTOR) 20 MG tablet TAKE 1 TABLET(20 MG) BY MOUTH DAILY Patient not taking: Reported on 05/01/2023 01/19/23   Elder Negus, MD  SPIRIVA RESPIMAT 2.5 MCG/ACT AERS INHALE 2 PUFFS INTO THE LUNGS DAILY 02/03/22   Icard, Rachel Bo, DO  spironolactone (ALDACTONE) 50 MG tablet TAKE 1 TABLET(50 MG) BY MOUTH DAILY 12/22/20   Patwardhan, Anabel Bene, MD  SYMBICORT 160-4.5 MCG/ACT inhaler INHALE 2 PUFFS INTO THE LUNGS TWICE DAILY 10/31/22   Icard, Rachel Bo, DO  tiZANidine (ZANAFLEX) 4 MG tablet Take 1 tablet (4 mg total) by mouth every 6 (six) hours as needed for muscle spasms. 04/23/23   Raulkar, Drema Pry, MD  TOUJEO MAX SOLOSTAR 300 UNIT/ML Solostar Pen Inject 60 Units into the skin in the morning. 04/27/23   Ivonne Andrew, NP      Allergies    Elavil [amitriptyline], Orudis [ketoprofen], Desyrel [trazodone], Tylenol [acetaminophen], Aspirin, Motrin [ibuprofen], Naprosyn [naproxen], Neurontin [gabapentin], Sulfa antibiotics, Ultram [tramadol], Victoza [liraglutide], and Zegerid [omeprazole-sodium bicarbonate]    Review of Systems   Review of Systems  Gastrointestinal:  Positive for abdominal pain.  All other systems reviewed and are negative.   Physical Exam Updated Vital Signs BP (!) 160/73   Pulse 89   Temp 98.8 F (37.1 C) (Oral)   Resp 20   SpO2 96%   Physical Exam Vitals and nursing note reviewed.  Constitutional:      Appearance: She is well-developed.  HENT:     Head: Normocephalic and atraumatic.  Cardiovascular:     Rate and Rhythm: Normal rate and regular rhythm.     Heart sounds: No murmur heard. Pulmonary:     Effort: Pulmonary effort is normal. No respiratory distress.     Breath sounds: Normal breath sounds.  Abdominal:     Palpations: Abdomen is soft.     Tenderness: There is no guarding or rebound.     Comments: Moderate generalized abdominal tenderness  Musculoskeletal:        General: No tenderness.     Comments: Nonpitting edema to BLE  Skin:    General: Skin is warm and dry.  Neurological:  Mental Status: She is alert and oriented to person, place, and time.  Psychiatric:        Behavior: Behavior normal.     ED Results / Procedures / Treatments   Labs (all labs ordered are listed, but only abnormal results are displayed) Labs Reviewed  COMPREHENSIVE METABOLIC PANEL - Abnormal; Notable for the following components:      Result Value   CO2 21 (*)    Glucose, Bld 176 (*)    Creatinine, Ser 1.17 (*)    GFR, Estimated 58 (*)    All other components within normal limits  CBC WITH DIFFERENTIAL/PLATELET - Abnormal; Notable for the following components:   RDW 17.7 (*)    All other components within normal limits  URINALYSIS, W/ REFLEX TO CULTURE (INFECTION SUSPECTED) - Abnormal; Notable for the following components:   Ketones, ur 5 (*)    All other components within normal limits  CBG MONITORING, ED - Abnormal; Notable for the following components:   Glucose-Capillary 51 (*)    All other components within normal limits  CBG MONITORING, ED - Abnormal; Notable for the following components:   Glucose-Capillary 49 (*)    All other components within normal limits  CBG MONITORING, ED - Abnormal; Notable for the following components:   Glucose-Capillary 124 (*)    All other components within normal limits   CBG MONITORING, ED - Abnormal; Notable for the following components:   Glucose-Capillary 104 (*)    All other components within normal limits  CBG MONITORING, ED - Abnormal; Notable for the following components:   Glucose-Capillary 135 (*)    All other components within normal limits  LIPASE, BLOOD    EKG None  Radiology No results found.  Procedures Procedures    Medications Ordered in ED Medications  oxyCODONE (Oxy IR/ROXICODONE) immediate release tablet 10 mg (10 mg Oral Given 05/11/23 2157)  ondansetron (ZOFRAN-ODT) disintegrating tablet 4 mg (4 mg Oral Given 05/11/23 2155)  dextrose 50 % solution 50 mL (50 mLs Intravenous Given 05/12/23 0125)  HYDROmorphone (DILAUDID) injection 1 mg (1 mg Intravenous Given 05/12/23 0103)  metoCLOPramide (REGLAN) injection 10 mg (10 mg Intravenous Given 05/12/23 0104)  HYDROmorphone (DILAUDID) injection 1 mg (1 mg Intravenous Given 05/12/23 0237)  metoCLOPramide (REGLAN) injection 10 mg (10 mg Intravenous Given 05/12/23 0237)  HYDROmorphone (DILAUDID) tablet 2 mg (2 mg Oral Given 05/12/23 0515)    ED Course/ Medical Decision Making/ A&P                             Medical Decision Making Risk OTC drugs. Prescription drug management.   Patient with history of hypertension, diabetes, chronic abdominal pain here for evaluation of abdominal pain, nausea, vomiting, diarrhea.  Episode is similar to her prior flareups.  She is on chronic pain medications but is planning on changing pain management practices.  She has generalized tenderness on examination without peritoneal findings.  Labs are near her baseline.  Current clinical picture is not consistent with acute pancreatitis, cholecystitis, appendicitis, perforated viscus.  She did have transient hypoglycemia in the emergency department, this was proved after medications and treatment.  She was observed for several hours after the episode and was able to tolerate p.o. without difficulty.  Discussed  with patient home care for recurrent chronic abdominal pain, possible gastroparesis.  Discussed switching her ondansetron to metoclopramide.  Discussed outpatient follow-up and return precautions.        Final Clinical  Impression(s) / ED Diagnoses Final diagnoses:  Generalized abdominal pain  Nausea vomiting and diarrhea  Hypoglycemia    Rx / DC Orders ED Discharge Orders          Ordered    metoCLOPramide (REGLAN) 10 MG tablet  Every 8 hours PRN        05/12/23 0502    Simethicone 180 MG CAPS  3 times daily PRN        05/12/23 0502              Tilden Fossa, MD 05/12/23 [phone]

## 2023-05-12 NOTE — ED Provider Notes (Signed)
[place] EMERGENCY DEPARTMENT AT [facility] Provider Note   CSN: [id number] Arrival date & time: 05/12/23  0865     History  Chief Complaint  Patient presents with   Abdominal Pain    Jill Shaw is a 47 y.o. female.  HPI 47 year old female presents with recurrent abdominal pain.  She states this is pretty typical at the end of the month where she will require more pain medicine than she is allowed at home.  She states she is on oxycodone 10 mg 4 times a day.  She was here yesterday evening and asked the doctor for extra oxycodone at home.  The pain is diffuse and in her low back.  No urinary symptoms, fevers.  She is having vomiting and diarrhea without blood.  Home Medications Prior to Admission medications   Medication Sig Start Date End Date Taking? Authorizing Provider  Accu-Chek Softclix Lancets lancets USE TO TEST AS DIRECTED UP TO FOUR TIMES DAILY. 03/06/22   Shamleffer, Konrad Dolores, MD  albuterol (PROVENTIL) (2.5 MG/3ML) 0.083% nebulizer solution Inhale into the lungs. 09/27/20   [provider]  albuterol (VENTOLIN HFA) 108 (90 Base) MCG/ACT inhaler Inhale 2 puffs into the lungs every 6 (six) hours as needed for wheezing or shortness of breath. 07/18/21   Luciano Cutter, MD  B-D ULTRAFINE III SHORT PEN 31G X 8 MM MISC USE AS DIRECTED EVERY MORNING, NOON, EVENING, AND AT BEDTIME 07/10/22   Shamleffer, Konrad Dolores, MD  Continuous Blood Gluc Sensor (FREESTYLE LIBRE 3 SENSOR) MISC Place 1 sensor on the skin every 14 days. Use to check glucose continuously Patient not taking: Reported on 05/01/2023 07/06/22   Ivonne Andrew, NP  Dexlansoprazole 30 MG capsule DR TAKE 1 CAPSULE (30 MG TOTAL) BY MOUTH DAILY. NEEDS OFFICE VISIT FOR ADDITIONAL REFILLS 03/26/23   Ivonne Andrew, NP  dicyclomine (BENTYL) 20 MG tablet Take 1 tablet (20 mg total) by mouth 2 (two) times daily. 02/16/23   Ivonne Andrew, NP  furosemide (LASIX) 40 MG tablet TAKE 1 TABLET(40  MG) BY MOUTH TWICE DAILY Patient taking differently: Take 40 mg by mouth 2 (two) times daily. 01/19/23   Patwardhan, Manish J, MD  glucose blood (ACCU-CHEK GUIDE) test strip USE TO TEST BLOOD GLUCOSE AS DIRECTED UP TO 4 TIMES DAILY 11/14/22   Ivonne Andrew, NP  HUMALOG KWIKPEN 200 UNIT/ML KwikPen Inject 50 Units into the skin 3 (three) times daily before meals. 03/19/23   Ivonne Andrew, NP  hydrochlorothiazide (MICROZIDE) 12.5 MG capsule TAKE 1 CAPSULE(12.5 MG) BY MOUTH DAILY Patient taking differently: Take 12.5 mg by mouth daily. 12/14/21   Cantwell, Celeste C, PA-C  HYDROmorphone (DILAUDID) 2 MG tablet Take 1 tablet (2 mg total) by mouth every 4 (four) hours as needed for up to 10 doses for severe pain. 04/15/23   Jeanelle Malling, PA  losartan (COZAAR) 100 MG tablet TAKE 1 TABLET BY MOUTH EVERY DAY 04/30/23   Ivonne Andrew, NP  metoCLOPramide (REGLAN) 10 MG tablet Take 1 tablet (10 mg total) by mouth every 6 (six) hours as needed for nausea. 01/17/23   Al Decant, PA-C  metoCLOPramide (REGLAN) 10 MG tablet Take 1 tablet (10 mg total) by mouth every 8 (eight) hours as needed for nausea. 05/12/23   Tilden Fossa, MD  metoprolol succinate (TOPROL-XL) 100 MG 24 hr tablet Take 100 mg by mouth daily. Take with or immediately following a meal.    [provider]  nitroGLYCERIN (  NITROSTAT) 0.4 MG SL tablet Place 1 tablet (0.4 mg total) under the tongue every 5 (five) minutes as needed for chest pain. Additional refills to be filled by PCP, patient aware 04/26/23   Yates Decamp, MD  norethindrone (AYGESTIN) 5 MG tablet Take 1 tablet (5 mg total) by mouth 3 (three) times daily. Patient not taking: Reported on 05/01/2023 04/30/23   Lorriane Shire, MD  ondansetron (ZOFRAN) 4 MG tablet Take 1 tablet (4 mg total) by mouth every 8 (eight) hours as needed for nausea or vomiting. 04/18/23   Ivonne Andrew, NP  oxyCODONE-acetaminophen (PERCOCET) 10-325 MG tablet Take 1 tablet by mouth every 6 (six) hours as  needed for pain. May take a extra tablet 10 days out of the month 04/18/23   Jones Bales, NP  Pimozide 1 MG TABS Take by mouth. 02/10/23   [provider]  potassium chloride SA (KLOR-CON M) 20 MEQ tablet Take 1 tablet (20 mEq total) by mouth daily. 03/19/23   Ivonne Andrew, NP  rosuvastatin (CRESTOR) 20 MG tablet TAKE 1 TABLET(20 MG) BY MOUTH DAILY Patient not taking: Reported on 05/01/2023 01/19/23   Elder Negus, MD  Simethicone 180 MG CAPS Take 1 capsule (180 mg total) by mouth 3 (three) times daily as needed. 05/12/23   Tilden Fossa, MD  SPIRIVA RESPIMAT 2.5 MCG/ACT AERS INHALE 2 PUFFS INTO THE LUNGS DAILY 02/03/22   Icard, Rachel Bo, DO  spironolactone (ALDACTONE) 50 MG tablet TAKE 1 TABLET(50 MG) BY MOUTH DAILY 12/22/20   Patwardhan, Anabel Bene, MD  SYMBICORT 160-4.5 MCG/ACT inhaler INHALE 2 PUFFS INTO THE LUNGS TWICE DAILY 10/31/22   Icard, Rachel Bo, DO  tiZANidine (ZANAFLEX) 4 MG tablet Take 1 tablet (4 mg total) by mouth every 6 (six) hours as needed for muscle spasms. 04/23/23   Raulkar, Drema Pry, MD  TOUJEO MAX SOLOSTAR 300 UNIT/ML Solostar Pen Inject 60 Units into the skin in the morning. 04/27/23   Ivonne Andrew, NP      Allergies    Elavil [amitriptyline], Orudis [ketoprofen], Desyrel [trazodone], Tylenol [acetaminophen], Aspirin, Motrin [ibuprofen], Naprosyn [naproxen], Neurontin [gabapentin], Sulfa antibiotics, Ultram [tramadol], Victoza [liraglutide], and Zegerid [omeprazole-sodium bicarbonate]    Review of Systems   Review of Systems  Constitutional:  Negative for fever.  Gastrointestinal:  Positive for abdominal pain, diarrhea, nausea and vomiting. Negative for blood in stool.  Genitourinary:  Negative for dysuria.    Physical Exam Updated Vital Signs BP (!) 126/111 (BP Location: Right Arm)   Pulse (!) 110   Temp 98.7 F (37.1 C) (Oral)   Resp (!) 24   Ht 5\' 4"  (1.626 m)   Wt 121.5 kg   SpO2 94%   BMI 45.98 kg/m  Physical Exam Vitals and nursing  note reviewed.  Constitutional:      Appearance: She is well-developed. She is obese.  HENT:     Head: Normocephalic and atraumatic.  Cardiovascular:     Rate and Rhythm: Regular rhythm. Tachycardia present.     Heart sounds: Normal heart sounds.  Pulmonary:     Effort: Pulmonary effort is normal.     Breath sounds: Normal breath sounds.  Abdominal:     Palpations: Abdomen is soft.     Tenderness: There is generalized abdominal tenderness.  Skin:    General: Skin is warm and dry.  Neurological:     Mental Status: She is alert.     ED Results / Procedures / Treatments   Labs (all labs  ordered are listed, but only abnormal results are displayed) Labs Reviewed  COMPREHENSIVE METABOLIC PANEL - Abnormal; Notable for the following components:      Result Value   Sodium 134 (*)    CO2 16 (*)    Glucose, Bld 351 (*)    Creatinine, Ser 1.43 (*)    GFR, Estimated 46 (*)    All other components within normal limits  CBC - Abnormal; Notable for the following components:   RDW 17.5 (*)    All other components within normal limits  URINALYSIS, ROUTINE W REFLEX MICROSCOPIC - Abnormal; Notable for the following components:   Glucose, UA >=500 (*)    All other components within normal limits  CBG MONITORING, ED - Abnormal; Notable for the following components:   Glucose-Capillary 355 (*)    All other components within normal limits  LIPASE, BLOOD  HCG, SERUM, QUALITATIVE    EKG None  Radiology No results found.  Procedures Procedures    Medications Ordered in ED Medications  lactated ringers bolus 1,000 mL (has no administration in time range)  HYDROmorphone (DILAUDID) injection 1 mg (has no administration in time range)  metoCLOPramide (REGLAN) injection 10 mg (has no administration in time range)    ED Course/ Medical Decision Making/ A&P                             Medical Decision Making Amount and/or Complexity of Data Reviewed Labs: ordered.    Details:  Hyperglycemia but anion gap not consistent with DKA.  No ketones in the urine. ECG/medicine tests: ordered and independent interpretation performed.    Details: No QTc.  Risk Prescription drug management.   Patient presents with abdominal pain.  Has had multiple prior visits for this and recently some negative CTs of her abdomen and pelvis.  I do not think repeat imaging needed.  I suspect this is a flare of her chronic GI issues which is likely related to her poorly controlled diabetes and gastroparesis.  She was given fluids given mild bump in her creatinine consistent with some dehydration.  Also given Dilaudid and Reglan which seem to temporarily help but now is having recurrent symptoms.  Will try droperidol.  Care transferred to Dr. Blinda Leatherwood.         Final Clinical Impression(s) / ED Diagnoses Final diagnoses:  None    Rx / DC Orders ED Discharge Orders     None         Pricilla Loveless, MD 05/12/23 [phone]

## 2023-05-13 DIAGNOSIS — R1084 Generalized abdominal pain: Secondary | ICD-10-CM | POA: Diagnosis not present

## 2023-05-13 MED ORDER — PROMETHAZINE HCL 25 MG RE SUPP
25.0000 mg | Freq: Four times a day (QID) | RECTAL | 0 refills | Status: DC | PRN
  Filled 2023-05-13: qty 12, 3d supply, fill #0

## 2023-05-13 MED ORDER — ONDANSETRON 4 MG PO TBDP
4.0000 mg | ORAL_TABLET | ORAL | 0 refills | Status: DC | PRN
  Filled 2023-05-13: qty 10, 2d supply, fill #0

## 2023-05-13 MED ORDER — METOCLOPRAMIDE HCL 5 MG/ML IJ SOLN
10.0000 mg | Freq: Once | INTRAMUSCULAR | Status: AC
  Administered 2023-05-13: 10 mg via INTRAVENOUS
  Filled 2023-05-13: qty 2

## 2023-05-13 MED ORDER — ONDANSETRON HCL 4 MG/2ML IJ SOLN
4.0000 mg | Freq: Once | INTRAMUSCULAR | Status: AC
  Administered 2023-05-13: 4 mg via INTRAVENOUS
  Filled 2023-05-13: qty 2

## 2023-05-13 MED ORDER — OXYCODONE HCL 5 MG PO TABS
10.0000 mg | ORAL_TABLET | ORAL | Status: AC
  Administered 2023-05-13: 10 mg via ORAL
  Filled 2023-05-13: qty 2

## 2023-05-13 MED ORDER — HYDROMORPHONE HCL 1 MG/ML IJ SOLN
1.0000 mg | Freq: Once | INTRAMUSCULAR | Status: AC
  Administered 2023-05-13: 1 mg via INTRAVENOUS
  Filled 2023-05-13: qty 1

## 2023-05-13 NOTE — ED Notes (Signed)
Pt continues to remove cardiac monitors. Pt educated on importance of leaving the monitors on. Pt ignoring this RN

## 2023-05-13 NOTE — ED Provider Notes (Signed)
Signed out by Dr. Criss Alvine.  Patient with recurrent nausea and vomiting.  Patient likely having some withdrawal symptoms secondary to her chronic pain.  Patient with low back pain that is chronic in nature, takes Oxy IR.  She reportedly tries very hard not to take extra pills but sometimes runs out at the end of the month.  Abdominal exam is benign.  Patient with normal electrolytes, renal function.  Glucose elevated but no signs of diabetic ketoacidosis, no anion gap.  She was hydrated with lactated Ringer's and provided multiple doses of analgesia and antiemetics.   Gilda Crease, MD 05/13/23 [phone]

## 2023-05-14 ENCOUNTER — Other Ambulatory Visit: Payer: Self-pay

## 2023-05-14 ENCOUNTER — Encounter: Payer: Self-pay | Admitting: General Practice

## 2023-05-15 ENCOUNTER — Encounter (HOSPITAL_COMMUNITY): Payer: Self-pay

## 2023-05-15 ENCOUNTER — Other Ambulatory Visit: Payer: Self-pay

## 2023-05-15 ENCOUNTER — Ambulatory Visit: Payer: Medicare Other | Admitting: Registered Nurse

## 2023-05-15 ENCOUNTER — Emergency Department (HOSPITAL_COMMUNITY)
Admission: EM | Admit: 2023-05-15 | Discharge: 2023-05-15 | Discharge status: Against Medical Advice | Payer: Medicare Other | Attending: Emergency Medicine | Admitting: Emergency Medicine

## 2023-05-15 ENCOUNTER — Emergency Department (HOSPITAL_COMMUNITY): Payer: Medicare Other

## 2023-05-15 DIAGNOSIS — K429 Umbilical hernia without obstruction or gangrene: Secondary | ICD-10-CM | POA: Diagnosis not present

## 2023-05-15 DIAGNOSIS — J449 Chronic obstructive pulmonary disease, unspecified: Secondary | ICD-10-CM | POA: Diagnosis not present

## 2023-05-15 DIAGNOSIS — R102 Pelvic and perineal pain: Secondary | ICD-10-CM | POA: Diagnosis not present

## 2023-05-15 DIAGNOSIS — N183 Chronic kidney disease, stage 3 unspecified: Secondary | ICD-10-CM | POA: Diagnosis not present

## 2023-05-15 DIAGNOSIS — R000 Tachycardia, unspecified: Secondary | ICD-10-CM | POA: Diagnosis not present

## 2023-05-15 DIAGNOSIS — R1084 Generalized abdominal pain: Secondary | ICD-10-CM | POA: Insufficient documentation

## 2023-05-15 DIAGNOSIS — Z79899 Other long term (current) drug therapy: Secondary | ICD-10-CM | POA: Insufficient documentation

## 2023-05-15 DIAGNOSIS — Z794 Long term (current) use of insulin: Secondary | ICD-10-CM | POA: Insufficient documentation

## 2023-05-15 DIAGNOSIS — I5032 Chronic diastolic (congestive) heart failure: Secondary | ICD-10-CM | POA: Insufficient documentation

## 2023-05-15 DIAGNOSIS — D72829 Elevated white blood cell count, unspecified: Secondary | ICD-10-CM | POA: Insufficient documentation

## 2023-05-15 DIAGNOSIS — E1142 Type 2 diabetes mellitus with diabetic polyneuropathy: Secondary | ICD-10-CM | POA: Insufficient documentation

## 2023-05-15 DIAGNOSIS — I130 Hypertensive heart and chronic kidney disease with heart failure and stage 1 through stage 4 chronic kidney disease, or unspecified chronic kidney disease: Secondary | ICD-10-CM | POA: Insufficient documentation

## 2023-05-15 DIAGNOSIS — R1032 Left lower quadrant pain: Secondary | ICD-10-CM | POA: Diagnosis not present

## 2023-05-15 DIAGNOSIS — R109 Unspecified abdominal pain: Secondary | ICD-10-CM | POA: Diagnosis present

## 2023-05-15 DIAGNOSIS — K449 Diaphragmatic hernia without obstruction or gangrene: Secondary | ICD-10-CM | POA: Diagnosis not present

## 2023-05-15 LAB — CBC
HCT: 37.8 % (ref 36.0–46.0)
Hemoglobin: 12.5 g/dL (ref 12.0–15.0)
MCH: 26.7 pg (ref 26.0–34.0)
MCHC: 33.1 g/dL (ref 30.0–36.0)
MCV: 80.6 fL (ref 80.0–100.0)
Platelets: 267 10*3/uL (ref 150–400)
RBC: 4.69 MIL/uL (ref 3.87–5.11)
RDW: 17.2 % — ABNORMAL HIGH (ref 11.5–15.5)
WBC: 10.7 10*3/uL — ABNORMAL HIGH (ref 4.0–10.5)
nRBC: 0 % (ref 0.0–0.2)

## 2023-05-15 LAB — COMPREHENSIVE METABOLIC PANEL
ALT: 12 U/L (ref 0–44)
AST: 15 U/L (ref 15–41)
Albumin: 3.6 g/dL (ref 3.5–5.0)
Alkaline Phosphatase: 60 U/L (ref 38–126)
Anion gap: 14 (ref 5–15)
BUN: 9 mg/dL (ref 6–20)
CO2: 17 mmol/L — ABNORMAL LOW (ref 22–32)
Calcium: 9.1 mg/dL (ref 8.9–10.3)
Chloride: 104 mmol/L (ref 98–111)
Creatinine, Ser: 1.25 mg/dL — ABNORMAL HIGH (ref 0.44–1.00)
GFR, Estimated: 54 mL/min — ABNORMAL LOW (ref >60)
Glucose, Bld: 239 mg/dL — ABNORMAL HIGH (ref 70–99)
Potassium: 3 mmol/L — ABNORMAL LOW (ref 3.5–5.1)
Sodium: 135 mmol/L (ref 135–145)
Total Bilirubin: 0.9 mg/dL (ref 0.3–1.2)
Total Protein: 6.8 g/dL (ref 6.5–8.1)

## 2023-05-15 LAB — LIPASE, BLOOD: Lipase: 33 U/L (ref 11–51)

## 2023-05-15 LAB — CBG MONITORING, ED
Glucose-Capillary: 201 mg/dL — ABNORMAL HIGH (ref 70–99)
Glucose-Capillary: 28 mg/dL — CL (ref 70–99)
Glucose-Capillary: 107 mg/dL — ABNORMAL HIGH (ref 70–99)

## 2023-05-15 LAB — HCG, SERUM, QUALITATIVE: Preg, Serum: NEGATIVE

## 2023-05-15 MED ORDER — DEXTROSE 50 % IV SOLN: 1.0000 | Freq: Once | INTRAVENOUS | Status: DC

## 2023-05-15 MED ORDER — HYDROMORPHONE HCL 1 MG/ML IJ SOLN
1.0000 mg | Freq: Once | INTRAMUSCULAR | Status: AC
  Administered 2023-05-15: 1 mg via INTRAVENOUS
  Filled 2023-05-15: qty 1

## 2023-05-15 MED ORDER — DICYCLOMINE HCL 10 MG/ML IM SOLN
20.0000 mg | Freq: Once | INTRAMUSCULAR | Status: AC
  Administered 2023-05-15: 20 mg via INTRAMUSCULAR
  Filled 2023-05-15: qty 2

## 2023-05-15 MED ORDER — OXYCODONE HCL 5 MG PO TABS
5.0000 mg | ORAL_TABLET | Freq: Once | ORAL | Status: AC
  Administered 2023-05-15: 5 mg via ORAL
  Filled 2023-05-15: qty 1

## 2023-05-15 MED ORDER — DEXTROSE 50 % IV SOLN
INTRAVENOUS | Status: AC
  Administered 2023-05-15: 50 mL
  Filled 2023-05-15: qty 50

## 2023-05-15 MED ORDER — POTASSIUM CHLORIDE CRYS ER 20 MEQ PO TBCR
40.0000 meq | EXTENDED_RELEASE_TABLET | Freq: Once | ORAL | Status: AC
  Administered 2023-05-15: 40 meq via ORAL
  Filled 2023-05-15: qty 2

## 2023-05-15 MED ORDER — LACTATED RINGERS IV BOLUS
1000.0000 mL | Freq: Once | INTRAVENOUS | Status: AC
  Administered 2023-05-15: 1000 mL via INTRAVENOUS

## 2023-05-15 MED ORDER — ONDANSETRON HCL 4 MG/2ML IJ SOLN
4.0000 mg | Freq: Once | INTRAMUSCULAR | Status: AC
  Administered 2023-05-15: 4 mg via INTRAVENOUS
  Filled 2023-05-15: qty 2

## 2023-05-15 MED ORDER — IOHEXOL 350 MG/ML SOLN
75.0000 mL | Freq: Once | INTRAVENOUS | Status: AC | PRN
  Administered 2023-05-15: 75 mL via INTRAVENOUS

## 2023-05-15 NOTE — ED Provider Notes (Signed)
[place] EMERGENCY DEPARTMENT AT [facility] Provider Note   CSN: [id number] Arrival date & time: 05/15/23  7829     History {Add pertinent medical, surgical, social history, OB history to HPI:1} Chief Complaint  Patient presents with   Abdominal Pain   Nausea   Pelvic Pain    Jill Shaw is a 47 y.o. female.  HPI      47yo female with history of chronic diastolic heart failure, type 2 diabetes, gastroparesis, COPD, major depressive disorder, CKD, history of abdominal pain, who presents with concern for abdominal pain, nausea, vomiting and diarrhea.  She was seen 4 days ago and 3 days ago for similar symptoms.  She reported she had been out of her pain medication, and it was felt at the time that her symptoms were secondary to withdrawal from opiates, and given prior imaging for similar symptoms and June, May, April and March, they sought symptom management and she was discharged.  She reports that after she left the emergency department she initially was feeling improved, however beginning today the symptoms returned.  Reports severe abdominal pain that is worse in her lower pelvis with radiation into the vagina.  Reports at least 5 episodes of vomiting, and 5 episodes of diarrhea today.  Denies any hematemesis or black or bloody stools.  Denies vaginal discharge or bleeding.  Denies dysuria, hematuria.  Reports she just had surgery a few weeks ago, on chart review it appears she had a hysteroscopy on 16 July with iud insertoin, d and c.    Past Medical History:  Diagnosis Date   Abnormal uterine bleeding (AUB)    Arthritis    knees, hands   Atypical chest pain    cardiology--- dr Rosemary Holms did cardiac cath 08-30-2021 showed minimal luminal irregularity involving LAD all other coronaries w/ normal  flow   Chronic diastolic (congestive) heart failure [facility])    cardiologist---- dr Rosemary Holms;  preserved ef   Chronic iron deficiency anemia    Chronic pain  syndrome    followed by pain management--- dr Carlis Abbott   CKD (chronic kidney disease), stage III (HCC)    Diabetic gastroparesis (HCC)    Diabetic peripheral neuropathy (HCC)    Fibromyalgia    GAD (generalized anxiety disorder)    Generalized abdominal pain    GERD (gastroesophageal reflux disease)    History of acute renal failure 03/21/2023   admission in epic due to N/V/D due ot severe sepsis POA due to UTI   History of chronic gastritis    inflammatory   History of diabetic ketoacidosis    multiple admission's last 3 in epic 06/ 2022;  01/ 2022;   09/ 2021   History of seizure 01/2016   hypoglycemic seizure   History of vertebral compression fracture 12/2020   T11 -- T12 & L1   Hyperlipidemia    Hypertension    followed by pcp   Insulin dependent type 2 diabetes mellitus (HCC)    uncontrolled,  followed by pcp   Irritable bowel syndrome with constipation    MDD (major depressive disorder)    Moderate COPD (chronic obstructive pulmonary disease) (HCC)    pulmology--- dr Tonia Brooms   Moderate persistent asthma    Sickle cell trait (HCC)    Vitamin D deficiency 10/2019   Wears glasses     Past Surgical History:  Procedure Laterality Date   CATARACT EXTRACTION W/ INTRAOCULAR LENS IMPLANT Left    CESAREAN SECTION  2001   for twins  DILATION AND CURETTAGE OF UTERUS N/A 08/20/2019   Procedure: DILATATION AND CURETTAGE;  Surgeon: Allie Bossier, MD;  [facility];  Service: Gynecology;  Laterality: N/A;   DILATION AND CURETTAGE OF UTERUS  05/01/2023   Procedure: DILATATION AND CURETTAGE;  Surgeon: Lorriane Shire, MD;  Location: [facility];  Service: Gynecology;;   ENDOMETRIAL ABLATION N/A 08/20/2019   Procedure: Maren Reamer Ablation;  Surgeon: Allie Bossier, MD;  [facility]: [facility];  Service: Gynecology;  Laterality: N/A;   EYE SURGERY Bilateral    laser right and cataract removed left eye   HYSTEROSCOPY N/A 05/01/2023   Procedure: HYSTEROSCOPY;  Surgeon: Lorriane Shire, MD;  Location: [facility];  Service: Gynecology;  Laterality: N/A;   INTRAUTERINE DEVICE (IUD) INSERTION N/A 05/01/2023   Procedure: INTRAUTERINE DEVICE (IUD) INSERTION;  Surgeon: Lorriane Shire, MD;  Location: [facility];  Service: Gynecology;  Laterality: N/A;   LEFT HEART CATH AND CORONARY ANGIOGRAPHY N/A 08/30/2021   Procedure: LEFT HEART CATH AND CORONARY ANGIOGRAPHY;  Surgeon: Elder Negus, MD;  [facility];  Service: Cardiovascular;  Laterality: N/A;   RADIOLOGY WITH ANESTHESIA N/A 09/16/2019   Procedure: MRI WITH ANESTHESIA   L SPINE WITHOUT CONTRAST, T SPINE WITHOUT CONTRAST , CERVICAL WITHOUT CONTRAST;  Surgeon: Radiologist, Medication, MD;  [facility]: [facility];  Service: Radiology;  Laterality: N/A;   TUBAL LIGATION     interval BTL   UPPER GI ENDOSCOPY  07/2017     Home Medications Prior to Admission medications   Medication Sig Start Date End Date Taking? Authorizing Provider  Accu-Chek Softclix Lancets lancets USE TO TEST AS DIRECTED UP TO FOUR TIMES DAILY. 03/06/22   Shamleffer, Konrad Dolores, MD  albuterol (PROVENTIL) (2.5 MG/3ML) 0.083% nebulizer solution Inhale into the lungs. 09/27/20   [provider]  albuterol (VENTOLIN HFA) 108 (90 Base) MCG/ACT inhaler Inhale 2 puffs into the lungs every 6 (six) hours as needed for wheezing or shortness of breath. 07/18/21   Luciano Cutter, MD  B-D ULTRAFINE III SHORT PEN 31G X 8 MM MISC USE AS DIRECTED EVERY MORNING, NOON, EVENING, AND AT BEDTIME 07/10/22   Shamleffer, Konrad Dolores, MD  Continuous Blood Gluc Sensor (FREESTYLE LIBRE 3 SENSOR) MISC Place 1 sensor on the skin every 14 days. Use to check glucose continuously Patient not taking: Reported on 05/01/2023 07/06/22   Ivonne Andrew, NP  Dexlansoprazole 30 MG capsule DR TAKE 1 CAPSULE (30 MG TOTAL) BY MOUTH DAILY. NEEDS OFFICE VISIT FOR ADDITIONAL REFILLS 03/26/23   Ivonne Andrew, NP  dicyclomine  (BENTYL) 20 MG tablet Take 1 tablet (20 mg total) by mouth 2 (two) times daily. 02/16/23   Ivonne Andrew, NP  furosemide (LASIX) 40 MG tablet TAKE 1 TABLET(40 MG) BY MOUTH TWICE DAILY Patient taking differently: Take 40 mg by mouth 2 (two) times daily. 01/19/23   Patwardhan, Manish J, MD  glucose blood (ACCU-CHEK GUIDE) test strip USE TO TEST BLOOD GLUCOSE AS DIRECTED UP TO 4 TIMES DAILY 11/14/22   Ivonne Andrew, NP  HUMALOG KWIKPEN 200 UNIT/ML KwikPen Inject 50 Units into the skin 3 (three) times daily before meals. 03/19/23   Ivonne Andrew, NP  hydrochlorothiazide (MICROZIDE) 12.5 MG capsule TAKE 1 CAPSULE(12.5 MG) BY MOUTH DAILY Patient taking differently: Take 12.5 mg by mouth daily. 12/14/21   Cantwell, Celeste C, PA-C  HYDROmorphone (DILAUDID) 2 MG tablet Take 1 tablet (2 mg total) by mouth every 4 (four) hours  as needed for up to 10 doses for severe pain. 04/15/23   Jeanelle Malling, PA  losartan (COZAAR) 100 MG tablet TAKE 1 TABLET BY MOUTH EVERY DAY 04/30/23   Ivonne Andrew, NP  metoCLOPramide (REGLAN) 10 MG tablet Take 1 tablet (10 mg total) by mouth every 6 (six) hours as needed for nausea. 01/17/23   Al Decant, PA-C  metoCLOPramide (REGLAN) 10 MG tablet Take 1 tablet (10 mg total) by mouth every 8 (eight) hours as needed for nausea. 05/12/23   Tilden Fossa, MD  metoprolol succinate (TOPROL-XL) 100 MG 24 hr tablet Take 100 mg by mouth daily. Take with or immediately following a meal.    [provider]  nitroGLYCERIN (NITROSTAT) 0.4 MG SL tablet Place 1 tablet (0.4 mg total) under the tongue every 5 (five) minutes as needed for chest pain. Additional refills to be filled by PCP, patient aware 04/26/23   Yates Decamp, MD  norethindrone (AYGESTIN) 5 MG tablet Take 1 tablet (5 mg total) by mouth 3 (three) times daily. Patient not taking: Reported on 05/01/2023 04/30/23   Lorriane Shire, MD  ondansetron (ZOFRAN) 4 MG tablet Take 1 tablet (4 mg total) by mouth every 8 (eight) hours as  needed for nausea or vomiting. 04/18/23   Ivonne Andrew, NP  ondansetron (ZOFRAN-ODT) 4 MG disintegrating tablet Take 1 tablet (4 mg total) by mouth every 4 (four) hours as needed nausea/vomit. 05/13/23   Gilda Crease, MD  oxyCODONE-acetaminophen (PERCOCET) 10-325 MG tablet Take 1 tablet by mouth every 6 (six) hours as needed for pain. May take a extra tablet 10 days out of the month 04/18/23   Jones Bales, NP  Pimozide 1 MG TABS Take by mouth. 02/10/23   [provider]  potassium chloride SA (KLOR-CON M) 20 MEQ tablet Take 1 tablet (20 mEq total) by mouth daily. 03/19/23   Ivonne Andrew, NP  promethazine (PHENERGAN) 25 MG suppository Unwrap and insert 1 suppository (25 mg total) rectally every 6 (six) hours as needed for nausea or vomiting. 05/13/23   Pollina, Canary Brim, MD  rosuvastatin (CRESTOR) 20 MG tablet TAKE 1 TABLET(20 MG) BY MOUTH DAILY Patient not taking: Reported on 05/01/2023 01/19/23   Patwardhan, Anabel Bene, MD  Simethicone 180 MG CAPS Take 1 capsule (180 mg total) by mouth 3 (three) times daily as needed. 05/12/23   Tilden Fossa, MD  SPIRIVA RESPIMAT 2.5 MCG/ACT AERS INHALE 2 PUFFS INTO THE LUNGS DAILY 02/03/22   Icard, Rachel Bo, DO  spironolactone (ALDACTONE) 50 MG tablet TAKE 1 TABLET(50 MG) BY MOUTH DAILY 12/22/20   Patwardhan, Anabel Bene, MD  SYMBICORT 160-4.5 MCG/ACT inhaler INHALE 2 PUFFS INTO THE LUNGS TWICE DAILY 10/31/22   Icard, Rachel Bo, DO  tiZANidine (ZANAFLEX) 4 MG tablet Take 1 tablet (4 mg total) by mouth every 6 (six) hours as needed for muscle spasms. 04/23/23   Raulkar, Drema Pry, MD  TOUJEO MAX SOLOSTAR 300 UNIT/ML Solostar Pen Inject 60 Units into the skin in the morning. 04/27/23   Ivonne Andrew, NP      Allergies    Elavil [amitriptyline], Orudis [ketoprofen], Desyrel [trazodone], Tylenol [acetaminophen], Aspirin, Motrin [ibuprofen], Naprosyn [naproxen], Neurontin [gabapentin], Sulfa antibiotics, Ultram [tramadol], Victoza [liraglutide], and  Zegerid [omeprazole-sodium bicarbonate]    Review of Systems   Review of Systems  Physical Exam Updated Vital Signs BP 113/69   Pulse (!) 124   Temp 98.2 F (36.8 C) (Oral)   Resp 18   Ht 5'  4" (1.626 m)   Wt 121.5 kg   SpO2 100%   BMI 45.98 kg/m  Physical Exam Vitals and nursing note reviewed.  Constitutional:      General: She is not in acute distress.    Appearance: She is well-developed. She is not diaphoretic.  HENT:     Head: Normocephalic and atraumatic.  Eyes:     Conjunctiva/sclera: Conjunctivae normal.  Cardiovascular:     Rate and Rhythm: Regular rhythm. Tachycardia present.     Heart sounds: Normal heart sounds. No murmur heard.    No friction rub. No gallop.  Pulmonary:     Effort: Pulmonary effort is normal. No respiratory distress.     Breath sounds: Normal breath sounds. No wheezing or rales.  Abdominal:     General: There is no distension.     Palpations: Abdomen is soft.     Tenderness: There is generalized abdominal tenderness. There is no guarding.  Musculoskeletal:        General: No tenderness.     Cervical back: Normal range of motion.  Skin:    General: Skin is warm and dry.     Findings: No erythema or rash.  Neurological:     Mental Status: She is alert and oriented to person, place, and time.     ED Results / Procedures / Treatments   Labs (all labs ordered are listed, but only abnormal results are displayed) Labs Reviewed  CBC - Abnormal; Notable for the following components:      Result Value   WBC 10.7 (*)    RDW 17.2 (*)    All other components within normal limits  CBG MONITORING, ED - Abnormal; Notable for the following components:   Glucose-Capillary 201 (*)    All other components within normal limits  HCG, SERUM, QUALITATIVE  LIPASE, BLOOD  COMPREHENSIVE METABOLIC PANEL  URINALYSIS, ROUTINE W REFLEX MICROSCOPIC    EKG None  Radiology No results found.  Procedures Procedures  {Document cardiac monitor,  telemetry assessment procedure when appropriate:1}  Medications Ordered in ED Medications - No data to display  ED Course/ Medical Decision Making/ A&P   {   Click here for ABCD2, HEART and other calculatorsREFRESH Note before signing :1}                                47yo female with history of chronic diastolic heart failure, type 2 diabetes, gastroparesis, COPD, major depressive disorder, CKD, history of abdominal pain, who presents with concern for abdominal pain, nausea, vomiting and diarrhea.  DDx includes appendicitis, pancreatitis, cholecystitis, pyelonephritis, nephrolithiasis, diverticulitis, PID, ovarian torsion, ectopic pregnancy, gastroparesis, DKA, SBO, opiate withdrawal, gastroenteritis.  Labs completed and personally about interpreted by me show mild hyperglycemia, no sign of pancreatitis, mild hypokalemia, stable creatinine, mild decrease in bicarb without significant anion gap, mild leukocytosis of 10.7, negative pregnancy test. No signs of DKA.   Given IV fluids, dilaudid, zofran for symptom management. \ Given pain severity, tachycardia, CT abdomen pelvis ordered which shows ***  Distorted by fibroids, does not look embedded, no perforation. Consider MRI pelvis as outpatient.   {Document critical care time when appropriate:1} {Document review of labs and clinical decision tools ie heart score, Chads2Vasc2 etc:1}  {Document your independent review of radiology images, and any outside records:1} {Document your discussion with family members, caretakers, and with consultants:1} {Document social determinants of health affecting pt's care:1} {Document your decision making why  or why not admission, treatments were needed:1} Final Clinical Impression(s) / ED Diagnoses Final diagnoses:  None    Rx / DC Orders ED Discharge Orders     None

## 2023-05-15 NOTE — ED Notes (Signed)
RN went to assess pt and pt was not in room and belongings were gone. Dr. Dalene Seltzer made aware.

## 2023-05-15 NOTE — ED Triage Notes (Signed)
Pt reports lower abd pain that radiates to her vagina along with n/v for the past 3 days

## 2023-05-16 ENCOUNTER — Telehealth: Payer: Self-pay | Admitting: Registered Nurse

## 2023-05-16 NOTE — Telephone Encounter (Signed)
PMP was Reviewed.  Jill Shaw will need to be scheduled for an appointment.

## 2023-05-17 ENCOUNTER — Other Ambulatory Visit: Payer: Self-pay

## 2023-05-17 ENCOUNTER — Encounter: Payer: Self-pay | Admitting: Obstetrics and Gynecology

## 2023-05-18 ENCOUNTER — Other Ambulatory Visit: Payer: Self-pay

## 2023-05-18 ENCOUNTER — Telehealth: Payer: Self-pay

## 2023-05-18 ENCOUNTER — Encounter: Payer: Medicare Other | Attending: Physical Medicine and Rehabilitation | Admitting: Registered Nurse

## 2023-05-18 ENCOUNTER — Encounter: Payer: Self-pay | Admitting: Registered Nurse

## 2023-05-18 ENCOUNTER — Encounter: Payer: Self-pay | Admitting: *Deleted

## 2023-05-18 VITALS — BP 164/84 | HR 97 | Ht 64.0 in | Wt 257.0 lb

## 2023-05-18 DIAGNOSIS — Z5181 Encounter for therapeutic drug level monitoring: Secondary | ICD-10-CM | POA: Diagnosis not present

## 2023-05-18 DIAGNOSIS — G8929 Other chronic pain: Secondary | ICD-10-CM

## 2023-05-18 DIAGNOSIS — G894 Chronic pain syndrome: Secondary | ICD-10-CM

## 2023-05-18 DIAGNOSIS — Z79891 Long term (current) use of opiate analgesic: Secondary | ICD-10-CM | POA: Diagnosis not present

## 2023-05-18 DIAGNOSIS — M5416 Radiculopathy, lumbar region: Secondary | ICD-10-CM

## 2023-05-18 DIAGNOSIS — M25511 Pain in right shoulder: Secondary | ICD-10-CM | POA: Insufficient documentation

## 2023-05-18 MED ORDER — OXYCODONE-ACETAMINOPHEN 7.5-325 MG PO TABS: 1.0000 | ORAL_TABLET | Freq: Every day | ORAL | 0 refills | Status: DC | PRN

## 2023-05-18 MED ORDER — OXYCODONE-ACETAMINOPHEN 7.5-325 MG PO TABS
1.0000 | ORAL_TABLET | Freq: Every day | ORAL | 0 refills | Status: DC | PRN
  Filled 2023-05-18: qty 150, 30d supply, fill #0

## 2023-05-18 NOTE — Telephone Encounter (Signed)
Pt called stating [facility] at [facility] does not have medication in stock. I called the pharmacy and they cancelled the prescription. Patient wants it sent to [facility]

## 2023-05-18 NOTE — Telephone Encounter (Signed)
PMP was Reviewed,  Oxycodone e-scribed to pharmacy. Ms. Provencio is aware. We will continue with slow weaning.

## 2023-05-18 NOTE — Progress Notes (Unsigned)
Subjective:    Patient ID: Jill Shaw, female    DOB: [birth year], 47 y.o.   MRN: [id number]  HPI: Jill Shaw is a 47 y.o. female who returns for follow up appointment for chronic pain and medication refill. states *** pain is located in  ***. rates pain ***. current exercise regime is walking and performing stretching exercises.  [name] Morphine equivalent is *** MME.   UDS ordered Today      Pain Inventory Average Pain 10 Pain Right Now 9 My pain is constant, sharp, burning, dull, stabbing, tingling, and aching  In the last 24 hours, has pain interfered with the following? General activity 1 Relation with others 1 Enjoyment of life 1 What TIME of day is your pain at its worst? morning , daytime, evening, and night Sleep (in general) Poor  Pain is worse with: walking, bending, sitting, inactivity, standing, and some activites Pain improves with: medication and heat Relief from Meds: 10  Family History  Problem Relation Age of Onset   Diabetes Mother    Hypertension Mother    Migraines Paternal Grandfather    Colon cancer Neg Hx    Esophageal cancer Neg Hx    Stomach cancer Neg Hx    Rectal cancer Neg Hx    Social History   Socioeconomic History   Marital status: Divorced    Spouse name: Not on file   Number of children: 3   Years of education: Not on file   Highest education level: Not on file  Occupational History   Occupation: unemployed  Tobacco Use   Smoking status: Some Days    Current packs/day: 0.00    Average packs/day: 0.3 packs/day for 26.0 years (6.5 ttl pk-yrs)    Types: Cigarettes    Start date: 09/13/1994    Last attempt to quit: 09/13/2020    Years since quitting: 2.6   Smokeless tobacco: Never   Tobacco comments:    4-5 cigarettes/day  Vaping Use   Vaping status: Never Used  Substance and Sexual Activity   Alcohol use: No   Drug use: No   Sexual activity: Not Currently    Birth control/protection: Injection  Other Topics  Concern   Not on file  Social History Narrative   Right Handed   Lives in a one story apartment, but lives on the second floor   Drinks caffeine once in awhile   Social Determinants of Health   Financial Resource Strain: Low Risk  (03/18/2023)   Overall Financial Resource Strain (CARDIA)    Difficulty of Paying Living Expenses: Not very hard  Food Insecurity: No Food Insecurity (03/18/2023)   Hunger Vital Sign    Worried About Running Out of Food in the Last Year: Never true    Ran Out of Food in the Last Year: Never true  Transportation Needs: No Transportation Needs (03/19/2023)   PRAPARE - Administrator, Civil Service (Medical): No    Lack of Transportation (Non-Medical): No  Physical Activity: Inactive (03/18/2023)   Exercise Vital Sign    Days of Exercise per Week: 0 days    Minutes of Exercise per Session: 0 min  Stress: Stress Concern Present (03/18/2023)   [employer] of Occupational Health - Occupational Stress Questionnaire    Feeling of Stress : To some extent  Social Connections: Moderately Isolated (03/18/2023)   Social Connection and Isolation Panel [NHANES]    Frequency of Communication with Friends and Family: More than three times  a week    Frequency of Social Gatherings with Friends and Family: Three times a week    Attends Religious Services: 1 to 4 times per year    Active Member of Clubs or Organizations: No    Attends Banker Meetings: Never    Marital Status: Divorced   Past Surgical History:  Procedure Laterality Date   CATARACT EXTRACTION W/ INTRAOCULAR LENS IMPLANT Left    CESAREAN SECTION  2001   for twins   DILATION AND CURETTAGE OF UTERUS N/A 08/20/2019   Procedure: DILATATION AND CURETTAGE;  Surgeon: Allie Bossier, MD;  [facility];  Service: Gynecology;  Laterality: N/A;   DILATION AND CURETTAGE OF UTERUS  05/01/2023   Procedure: DILATATION AND CURETTAGE;  Surgeon: Lorriane Shire, MD;  Location: [facility];  Service: Gynecology;;   ENDOMETRIAL ABLATION N/A 08/20/2019   Procedure: Maren Reamer Ablation;  Surgeon: Allie Bossier, MD;  [facility];  Service: Gynecology;  Laterality: N/A;   EYE SURGERY Bilateral    laser right and cataract removed left eye   HYSTEROSCOPY N/A 05/01/2023   Procedure: HYSTEROSCOPY;  Surgeon: Lorriane Shire, MD;  Location: [facility];  Service: Gynecology;  Laterality: N/A;   INTRAUTERINE DEVICE (IUD) INSERTION N/A 05/01/2023   Procedure: INTRAUTERINE DEVICE (IUD) INSERTION;  Surgeon: Lorriane Shire, MD;  Location: [facility];  Service: Gynecology;  Laterality: N/A;   LEFT HEART CATH AND CORONARY ANGIOGRAPHY N/A 08/30/2021   Procedure: LEFT HEART CATH AND CORONARY ANGIOGRAPHY;  Surgeon: Elder Negus, MD;  [facility];  Service: Cardiovascular;  Laterality: N/A;   RADIOLOGY WITH ANESTHESIA N/A 09/16/2019   Procedure: MRI WITH ANESTHESIA   L SPINE WITHOUT CONTRAST, T SPINE WITHOUT CONTRAST , CERVICAL WITHOUT CONTRAST;  Surgeon: Radiologist, Medication, MD;  [facility];  Service: Radiology;  Laterality: N/A;   TUBAL LIGATION     interval BTL   UPPER GI ENDOSCOPY  07/2017   Past Surgical History:  Procedure Laterality Date   CATARACT EXTRACTION W/ INTRAOCULAR LENS IMPLANT Left    CESAREAN SECTION  2001   for twins   DILATION AND CURETTAGE OF UTERUS N/A 08/20/2019   Procedure: DILATATION AND CURETTAGE;  Surgeon: Allie Bossier, MD;  [facility];  Service: Gynecology;  Laterality: N/A;   DILATION AND CURETTAGE OF UTERUS  05/01/2023   Procedure: DILATATION AND CURETTAGE;  Surgeon: Lorriane Shire, MD;  Location: [facility];  Service: Gynecology;;   ENDOMETRIAL ABLATION N/A 08/20/2019   Procedure: Maren Reamer Ablation;  Surgeon: Allie Bossier, MD;  [facility];  Service: Gynecology;  Laterality: N/A;   EYE SURGERY Bilateral    laser right and cataract removed left eye    HYSTEROSCOPY N/A 05/01/2023   Procedure: HYSTEROSCOPY;  Surgeon: Lorriane Shire, MD;  Location: [facility];  Service: Gynecology;  Laterality: N/A;   INTRAUTERINE DEVICE (IUD) INSERTION N/A 05/01/2023   Procedure: INTRAUTERINE DEVICE (IUD) INSERTION;  Surgeon: Lorriane Shire, MD;  Location: [facility];  Service: Gynecology;  Laterality: N/A;   LEFT HEART CATH AND CORONARY ANGIOGRAPHY N/A 08/30/2021   Procedure: LEFT HEART CATH AND CORONARY ANGIOGRAPHY;  Surgeon: Elder Negus, MD;  [facility];  Service: Cardiovascular;  Laterality: N/A;   RADIOLOGY WITH ANESTHESIA N/A 09/16/2019   Procedure: MRI WITH ANESTHESIA   L SPINE WITHOUT CONTRAST, T SPINE WITHOUT CONTRAST , CERVICAL WITHOUT CONTRAST;  Surgeon: Radiologist, Medication, MD;  [facility];  Service: Radiology;  Laterality: N/A;   TUBAL LIGATION     interval BTL   UPPER GI ENDOSCOPY  07/2017   Past Medical History:  Diagnosis Date   Abnormal uterine bleeding (AUB)    Arthritis    knees, hands   Atypical chest pain    cardiology--- dr Rosemary Holms did cardiac cath 08-30-2021 showed minimal luminal irregularity involving LAD all other coronaries w/ normal  flow   Chronic diastolic (congestive) heart failure [facility])    cardiologist---- dr Rosemary Holms;  preserved ef   Chronic iron deficiency anemia    Chronic pain syndrome    followed by pain management--- dr Carlis Abbott   CKD (chronic kidney disease), stage III (HCC)    Diabetic gastroparesis (HCC)    Diabetic peripheral neuropathy (HCC)    Fibromyalgia    GAD (generalized anxiety disorder)    Generalized abdominal pain    GERD (gastroesophageal reflux disease)    History of acute renal failure 03/21/2023   admission in epic due to N/V/D due ot severe sepsis POA due to UTI   History of chronic gastritis    inflammatory   History of diabetic ketoacidosis    multiple admission's last 3 in epic 06/ 2022;  01/ 2022;   09/ 2021    History of seizure 01/2016   hypoglycemic seizure   History of vertebral compression fracture 12/2020   T11 -- T12 & L1   Hyperlipidemia    Hypertension    followed by pcp   Insulin dependent type 2 diabetes mellitus (HCC)    uncontrolled,  followed by pcp   Irritable bowel syndrome with constipation    MDD (major depressive disorder)    Moderate COPD (chronic obstructive pulmonary disease) (HCC)    pulmology--- dr Tonia Brooms   Moderate persistent asthma    Sickle cell trait (HCC)    Vitamin D deficiency 10/2019   Wears glasses    There were no vitals taken for this visit.  Opioid Risk Score:   Fall Risk Score:  `1  Depression screen [facility] 2/9     04/23/2023   11:51 AM 04/03/2023    2:33 PM 03/21/2023    1:34 PM 02/19/2023   10:00 AM 01/25/2023   10:52 AM 01/04/2023    3:22 PM 12/25/2022   10:18 AM  Depression screen PHQ 2/9  Decreased Interest 0 2 1 0 0 0 2  Down, Depressed, Hopeless 0 2 1 0 0 0 2  PHQ - 2 Score 0 4 2 0 0 0 4  Altered sleeping  2    3   Tired, decreased energy  2    3   Change in appetite  2       Feeling bad or failure about yourself   0       Trouble concentrating  2       Moving slowly or fidgety/restless  0       Suicidal thoughts  0       PHQ-9 Score  12    6     Review of Systems  Musculoskeletal:  Positive for arthralgias, back pain, gait problem and neck pain.       Pain all over the body  All other systems reviewed and are negative.     Objective:   Physical Exam        Assessment & Plan:

## 2023-05-21 ENCOUNTER — Other Ambulatory Visit: Payer: Self-pay | Admitting: Obstetrics and Gynecology

## 2023-05-21 ENCOUNTER — Other Ambulatory Visit: Payer: Self-pay | Admitting: Physical Medicine and Rehabilitation

## 2023-05-21 ENCOUNTER — Encounter: Payer: Medicare Other | Admitting: Registered Nurse

## 2023-05-21 ENCOUNTER — Other Ambulatory Visit: Payer: Self-pay

## 2023-05-21 ENCOUNTER — Encounter: Payer: Self-pay | Admitting: Physical Medicine and Rehabilitation

## 2023-05-21 DIAGNOSIS — N939 Abnormal uterine and vaginal bleeding, unspecified: Secondary | ICD-10-CM

## 2023-05-22 ENCOUNTER — Other Ambulatory Visit (HOSPITAL_COMMUNITY): Payer: Self-pay

## 2023-05-22 ENCOUNTER — Other Ambulatory Visit: Payer: Self-pay

## 2023-05-22 MED ORDER — NORETHINDRONE ACETATE 5 MG PO TABS
5.0000 mg | ORAL_TABLET | Freq: Three times a day (TID) | ORAL | 0 refills | Status: DC
  Filled 2023-05-22 – 2023-05-23 (×2): qty 90, 30d supply, fill #0

## 2023-05-22 MED ORDER — TIZANIDINE HCL 4 MG PO TABS
4.0000 mg | ORAL_TABLET | Freq: Four times a day (QID) | ORAL | 0 refills | Status: DC | PRN
  Filled 2023-05-22: qty 120, 30d supply, fill #0

## 2023-05-23 ENCOUNTER — Other Ambulatory Visit: Payer: Self-pay

## 2023-05-28 ENCOUNTER — Other Ambulatory Visit: Payer: Self-pay

## 2023-05-30 ENCOUNTER — Ambulatory Visit: Payer: Medicare Other | Admitting: Obstetrics and Gynecology

## 2023-05-31 ENCOUNTER — Other Ambulatory Visit: Payer: Self-pay

## 2023-06-01 ENCOUNTER — Telehealth: Payer: Self-pay | Admitting: Registered Nurse

## 2023-06-01 MED ORDER — OXYCODONE-ACETAMINOPHEN 5-325 MG PO TABS: ORAL_TABLET | ORAL | 0 refills | Status: DC

## 2023-06-01 NOTE — Telephone Encounter (Signed)
PMP was Reviewed.  Oxycodone weaned prescription sent to pharmacy.  Call placed to Ms. Pell regarding the above: Last Prescription. No answer. Left message to return the call.

## 2023-06-05 ENCOUNTER — Other Ambulatory Visit: Payer: Self-pay | Admitting: Nurse Practitioner

## 2023-06-05 ENCOUNTER — Other Ambulatory Visit: Payer: Self-pay

## 2023-06-05 DIAGNOSIS — Z794 Long term (current) use of insulin: Secondary | ICD-10-CM

## 2023-06-05 MED ORDER — DICYCLOMINE HCL 20 MG PO TABS
20.0000 mg | ORAL_TABLET | Freq: Two times a day (BID) | ORAL | 3 refills | Status: DC
  Filled 2023-06-05: qty 60, 30d supply, fill #0
  Filled 2023-07-19: qty 60, 30d supply, fill #1
  Filled 2023-08-13 (×3): qty 60, 30d supply, fill #2
  Filled 2023-09-26 (×2): qty 60, 30d supply, fill #3

## 2023-06-05 MED ORDER — HUMALOG KWIKPEN 200 UNIT/ML ~~LOC~~ SOPN
50.0000 [IU] | PEN_INJECTOR | Freq: Three times a day (TID) | SUBCUTANEOUS | 0 refills | Status: DC
  Filled 2023-06-05: qty 60, 80d supply, fill #0
  Filled 2023-06-08: qty 24, 32d supply, fill #0
  Filled 2023-07-19: qty 24, 32d supply, fill #1
  Filled 2023-08-13 (×2): qty 12, 16d supply, fill #2

## 2023-06-06 ENCOUNTER — Other Ambulatory Visit: Payer: Self-pay | Admitting: Nurse Practitioner

## 2023-06-06 ENCOUNTER — Other Ambulatory Visit: Payer: Self-pay

## 2023-06-06 MED ORDER — TIZANIDINE HCL 4 MG PO TABS
4.0000 mg | ORAL_TABLET | Freq: Four times a day (QID) | ORAL | 0 refills | Status: DC | PRN
  Filled 2023-06-06 – 2023-06-08 (×2): qty 120, 30d supply, fill #0

## 2023-06-06 MED ORDER — ONDANSETRON HCL 4 MG PO TABS
4.0000 mg | ORAL_TABLET | Freq: Three times a day (TID) | ORAL | 0 refills | Status: DC | PRN
  Filled 2023-06-06 – 2023-06-13 (×2): qty 20, 7d supply, fill #0

## 2023-06-07 ENCOUNTER — Other Ambulatory Visit: Payer: Self-pay

## 2023-06-08 ENCOUNTER — Other Ambulatory Visit: Payer: Self-pay

## 2023-06-08 NOTE — Telephone Encounter (Signed)
Patient is asking for [place] to fill percocet 10. She states that she was put down to 5 because you were trying to wean her off but the 5 isnt doing anything and she is taking more. Please advise.

## 2023-06-08 NOTE — Telephone Encounter (Signed)
LMOM

## 2023-06-11 ENCOUNTER — Other Ambulatory Visit: Payer: Self-pay

## 2023-06-11 ENCOUNTER — Ambulatory Visit: Payer: Medicare Other | Admitting: Nurse Practitioner

## 2023-06-11 ENCOUNTER — Encounter: Payer: Self-pay | Admitting: Nurse Practitioner

## 2023-06-11 VITALS — BP 168/84 | HR 99 | Wt 265.6 lb

## 2023-06-11 DIAGNOSIS — Z794 Long term (current) use of insulin: Secondary | ICD-10-CM | POA: Diagnosis not present

## 2023-06-11 DIAGNOSIS — G894 Chronic pain syndrome: Secondary | ICD-10-CM | POA: Diagnosis not present

## 2023-06-11 DIAGNOSIS — E119 Type 2 diabetes mellitus without complications: Secondary | ICD-10-CM | POA: Diagnosis not present

## 2023-06-11 LAB — POCT GLYCOSYLATED HEMOGLOBIN (HGB A1C): HbA1c, POC (controlled diabetic range): 8.4 % — AB (ref 0.0–7.0)

## 2023-06-11 MED ORDER — TIZANIDINE HCL 4 MG PO TABS
4.0000 mg | ORAL_TABLET | Freq: Four times a day (QID) | ORAL | 0 refills | Status: DC | PRN
  Filled 2023-06-11 – 2023-06-14 (×4): qty 20, 5d supply, fill #0

## 2023-06-11 NOTE — Patient Instructions (Addendum)
1. Controlled type 2 diabetes mellitus without complication, with long-term current use of insulin (HCC)  - POCT glycosylated hemoglobin (Hb A1C) - AMB Referral to Pharmacy Medication Management   2. Chronic pain syndrome  - Ambulatory referral to Pain Clinic   Follow up:  Follow up in 3 months

## 2023-06-11 NOTE — Progress Notes (Signed)
@Patient  ID: Jill Shaw, female    DOB: 25-Jul-1976, 47 y.o.   MRN: [id number]  Chief Complaint  Patient presents with   Diabetes   Medication Refill    Referring provider: Ivonne Andrew, NP   HPI  Jill Shaw presents for follow up. She  has a past medical history of Anemia, Arthritis, Asthma, Chronic diastolic (congestive) heart failure (HCC), COPD (chronic obstructive pulmonary disease) (HCC), Diabetes mellitus without complication (HCC), DKA (diabetic ketoacidosis) (HCC) (10/15/2020), Dysfunctional uterine bleeding, GERD (gastroesophageal reflux disease), Hypertension, Neuromuscular disorder (HCC), Seizures (HCC) (09/12/2017), Sickle cell trait (HCC), Smoker, Vitamin D deficiency (10/2019), and Wears glasses.     Patient presents today for a diabetic follow-up.  Appears that patient is noncompliant with medications.  A1c in office today is 8.4.  We will have her consult with pharmacy for diabetic medication management and med reconciliation.  We advised her for future visits to bring all of her medications with her to the visit. Denies f/c/s, n/v/d, hemoptysis, PND, leg swelling. Denies chest pain or edema.   Note: Patient is requesting refills on her pain medicines.  She was just prescribed a month supply through pain clinic on 06/02/2023.  We discussed that we will not refill these for her.  We discussed that she does need to talk to her pain clinic about these.  She states the pain clinic is not going to see her for any further visits.  We will have our referral coordinator check into this.  If her current pain clinic does not see her anymore we will refer her to a new pain clinic.     Allergies  Allergen Reactions   Elavil [Amitriptyline] Other (See Comments)    Coma   Orudis [Ketoprofen] Nausea And Vomiting   Desyrel [Trazodone] Nausea And Vomiting   Tylenol [Acetaminophen] Nausea And Vomiting   Aspirin Nausea Only   Motrin [Ibuprofen] Nausea And Vomiting    Naprosyn [Naproxen] Nausea And Vomiting   Neurontin [Gabapentin] Nausea And Vomiting and Other (See Comments)    upset stomach   Sulfa Antibiotics Nausea And Vomiting   Ultram [Tramadol] Nausea And Vomiting and Other (See Comments)    stomach upset   Victoza [Liraglutide] Nausea And Vomiting   Zegerid [Omeprazole-Sodium Bicarbonate] Nausea And Vomiting    Immunization History  Administered Date(s) Administered   Influenza,inj,Quad PF,6+ Mos 09/03/2018, 07/03/2019, 07/04/2020, 07/01/2021   Pneumococcal Polysaccharide-23 03/28/2015, 07/04/2020   Tdap 03/27/2016   Unspecified SARS-COV-2 Vaccination 03/25/2020, 05/05/2020    Past Medical History:  Diagnosis Date   Abnormal uterine bleeding (AUB)    Arthritis    knees, hands   Atypical chest pain    cardiology--- dr Rosemary Holms did cardiac cath 08-30-2021 showed minimal luminal irregularity involving LAD all other coronaries w/ normal  flow   Chronic diastolic (congestive) heart failure [facility])    cardiologist---- dr Rosemary Holms;  preserved ef   Chronic iron deficiency anemia    Chronic pain syndrome    followed by pain management--- dr Carlis Abbott   CKD (chronic kidney disease), stage III (HCC)    Diabetic gastroparesis (HCC)    Diabetic peripheral neuropathy (HCC)    Fibromyalgia    GAD (generalized anxiety disorder)    Generalized abdominal pain    GERD (gastroesophageal reflux disease)    History of acute renal failure 03/21/2023   admission in epic due to N/V/D due ot severe sepsis POA due to UTI   History of chronic gastritis    inflammatory  History of diabetic ketoacidosis    multiple admission's last 3 in epic 06/ 2022;  01/ 2022;   09/ 2021   History of seizure 01/2016   hypoglycemic seizure   History of vertebral compression fracture 12/2020   T11 -- T12 & L1   Hyperlipidemia    Hypertension    followed by pcp   Insulin dependent type 2 diabetes mellitus (HCC)    uncontrolled,  followed by pcp   Irritable bowel  syndrome with constipation    MDD (major depressive disorder)    Moderate COPD (chronic obstructive pulmonary disease) (HCC)    pulmology--- dr Tonia Brooms   Moderate persistent asthma    Sickle cell trait (HCC)    Vitamin D deficiency 10/2019   Wears glasses     Tobacco History: Social History   Tobacco Use  Smoking Status Some Days   Current packs/day: 0.00   Average packs/day: 0.3 packs/day for 26.0 years (6.5 ttl pk-yrs)   Types: Cigarettes   Start date: 09/13/1994   Last attempt to quit: 09/13/2020   Years since quitting: 2.7  Smokeless Tobacco Never  Tobacco Comments   4-5 cigarettes/day   Ready to quit: Not Answered Counseling given: Not Answered Tobacco comments: 4-5 cigarettes/day   Outpatient Encounter Medications as of 06/11/2023  Medication Sig   Accu-Chek Softclix Lancets lancets USE TO TEST AS DIRECTED UP TO FOUR TIMES DAILY.   albuterol (VENTOLIN HFA) 108 (90 Base) MCG/ACT inhaler Inhale 2 puffs into the lungs every 6 (six) hours as needed for wheezing or shortness of breath.   B-D ULTRAFINE III SHORT PEN 31G X 8 MM MISC USE AS DIRECTED EVERY MORNING, NOON, EVENING, AND AT BEDTIME   Continuous Blood Gluc Sensor (FREESTYLE LIBRE 3 SENSOR) MISC Place 1 sensor on the skin every 14 days. Use to check glucose continuously   Dexlansoprazole 30 MG capsule DR TAKE 1 CAPSULE (30 MG TOTAL) BY MOUTH DAILY. NEEDS OFFICE VISIT FOR ADDITIONAL REFILLS   dicyclomine (BENTYL) 20 MG tablet Take 1 tablet (20 mg total) by mouth 2 (two) times daily.   furosemide (LASIX) 40 MG tablet TAKE 1 TABLET(40 MG) BY MOUTH TWICE DAILY (Patient taking differently: Take 40 mg by mouth 2 (two) times daily.)   glucose blood (ACCU-CHEK GUIDE) test strip USE TO TEST BLOOD GLUCOSE AS DIRECTED UP TO 4 TIMES DAILY   HUMALOG KWIKPEN 200 UNIT/ML KwikPen Inject 50 Units into the skin 3 (three) times daily before meals.   hydrochlorothiazide (MICROZIDE) 12.5 MG capsule TAKE 1 CAPSULE(12.5 MG) BY MOUTH DAILY  (Patient taking differently: Take 12.5 mg by mouth daily.)   losartan (COZAAR) 100 MG tablet TAKE 1 TABLET BY MOUTH EVERY DAY   metoCLOPramide (REGLAN) 10 MG tablet Take 1 tablet (10 mg total) by mouth every 6 (six) hours as needed for nausea.   metoCLOPramide (REGLAN) 10 MG tablet Take 1 tablet (10 mg total) by mouth every 8 (eight) hours as needed for nausea.   metoprolol succinate (TOPROL-XL) 100 MG 24 hr tablet Take 100 mg by mouth daily. Take with or immediately following a meal.   nitroGLYCERIN (NITROSTAT) 0.4 MG SL tablet Place 1 tablet (0.4 mg total) under the tongue every 5 (five) minutes as needed for chest pain. Additional refills to be filled by PCP, patient aware   norethindrone (AYGESTIN) 5 MG tablet Take 1 tablet (5 mg total) by mouth 3 (three) times daily.   ondansetron (ZOFRAN) 4 MG tablet Take 1 tablet (4 mg total) by mouth every 8 (  eight) hours as needed for nausea or vomiting.   oxyCODONE-acetaminophen (PERCOCET) 5-325 MG tablet One tablet 4 times a day as needed for pain for 7 days. One tablet 3 times a day as needed for pain for 7 days. One tablet for pain  twice a day as needed for pain for 7 days. One tablet daily as needed for 7 days. Then one tablet every other day for 7 days then discontinued.   Pimozide 1 MG TABS Take by mouth.   potassium chloride SA (KLOR-CON M) 20 MEQ tablet Take 1 tablet (20 mEq total) by mouth daily.   rosuvastatin (CRESTOR) 20 MG tablet TAKE 1 TABLET(20 MG) BY MOUTH DAILY   Simethicone 180 MG CAPS Take 1 capsule (180 mg total) by mouth 3 (three) times daily as needed.   SPIRIVA RESPIMAT 2.5 MCG/ACT AERS INHALE 2 PUFFS INTO THE LUNGS DAILY   spironolactone (ALDACTONE) 50 MG tablet TAKE 1 TABLET(50 MG) BY MOUTH DAILY   SYMBICORT 160-4.5 MCG/ACT inhaler INHALE 2 PUFFS INTO THE LUNGS TWICE DAILY   TOUJEO MAX SOLOSTAR 300 UNIT/ML Solostar Pen Inject 60 Units into the skin in the morning.   [DISCONTINUED] tiZANidine (ZANAFLEX) 4 MG tablet Take 1 tablet (4  mg total) by mouth every 6 (six) hours as needed for muscle spasms.   tiZANidine (ZANAFLEX) 4 MG tablet Take 1 tablet (4 mg total) by mouth every 6 (six) hours as needed for muscle spasms.   No facility-administered encounter medications on file as of 06/11/2023.     Review of Systems  Review of Systems  Constitutional: Negative.   HENT: Negative.    Cardiovascular: Negative.   Gastrointestinal: Negative.   Allergic/Immunologic: Negative.   Neurological: Negative.   Psychiatric/Behavioral: Negative.         Physical Exam  BP (!) 168/84 (BP Location: Right Arm, Patient Position: Sitting, Cuff Size: Large)   Pulse 99   Wt 265 lb 9.6 oz (120.5 kg)   SpO2 100%   BMI 45.59 kg/m   Wt Readings from Last 5 Encounters:  06/11/23 265 lb 9.6 oz (120.5 kg)  05/18/23 257 lb (116.6 kg)  05/15/23 267 lb 13.7 oz (121.5 kg)  05/12/23 267 lb 13.7 oz (121.5 kg)  05/01/23 267 lb 14.4 oz (121.5 kg)     Physical Exam Vitals and nursing note reviewed.  Constitutional:      General: She is not in acute distress.    Appearance: She is well-developed.  Cardiovascular:     Rate and Rhythm: Normal rate and regular rhythm.  Pulmonary:     Effort: Pulmonary effort is normal.     Breath sounds: Normal breath sounds.  Neurological:     Mental Status: She is alert and oriented to person, place, and time.      Lab Results:  CBC    Component Value Date/Time   WBC 10.7 (H) 05/15/2023 0738   RBC 4.69 05/15/2023 0738   HGB 12.5 05/15/2023 0738   HGB 12.1 01/04/2023 1556   HCT 37.8 05/15/2023 0738   HCT 38.3 01/04/2023 1556   PLT 267 05/15/2023 0738   PLT 248 01/04/2023 1556   MCV 80.6 05/15/2023 0738   MCV 85 01/04/2023 1556   MCH 26.7 05/15/2023 0738   MCHC 33.1 05/15/2023 0738   RDW 17.2 (H) 05/15/2023 0738   RDW 15.8 (H) 01/04/2023 1556   LYMPHSABS 2.6 05/11/2023 2208   LYMPHSABS 2.3 07/29/2021 1629   MONOABS 0.4 05/11/2023 2208   EOSABS 0.4 05/11/2023 2208  EOSABS 0.4  07/29/2021 1629   BASOSABS 0.0 05/11/2023 2208   BASOSABS 0.0 07/29/2021 1629    BMET    Component Value Date/Time   NA 135 05/15/2023 0738   NA 142 01/04/2023 1556   K 3.0 (L) 05/15/2023 0738   CL 104 05/15/2023 0738   CO2 17 (L) 05/15/2023 0738   GLUCOSE 239 (H) 05/15/2023 0738   BUN 9 05/15/2023 0738   BUN 11 01/04/2023 1556   CREATININE 1.25 (H) 05/15/2023 0738   CREATININE 1.18 (H) 08/31/2017 1621   CALCIUM 9.1 05/15/2023 0738   GFRNONAA 54 (L) 05/15/2023 0738   GFRNONAA 58 (L) 08/31/2017 1621   GFRAA 49 (L) 12/02/2020 1405   GFRAA 67 08/31/2017 1621    BNP    Component Value Date/Time   BNP 129.3 (H) 09/18/2022 1630   BNP 67.5 03/30/2017 1438    ProBNP    Component Value Date/Time   PROBNP 57.0 07/12/2020 1649    Imaging: US Pelvis Complete  Result Date: 05/15/2023 CLINICAL DATA:  Pelvic pain. EXAM: TRANSABDOMINAL ULTRASOUND OF PELVIS TECHNIQUE: Transabdominal ultrasound examination of the pelvis was performed including evaluation of the uterus, ovaries, adnexal regions, and pelvic cul-de-sac. COMPARISON:  CT scan of same day.  Ultrasound of Mar 08, 2023. FINDINGS: Uterus Measurements: 13.0 x 9.4 x 6.8 cm = volume: 436 mL. No fibroids or other mass visualized. Endometrium Thickness: 6 mm which is within normal limits. No focal abnormality visualized. Right ovary Not visualized. Left ovary Not visualized. Other findings:  No abnormal free fluid. IMPRESSION: Ovaries are not visualized. Intrauterine device noted on CT scan of same day is also not visualized. Presumably it has been removed or it is too low within the uterus to be visualized due to body habitus. Electronically Signed   By: Lupita Raider M.D.   On: 05/15/2023 12:59   CT ABDOMEN PELVIS W CONTRAST  Result Date: 05/15/2023 CLINICAL DATA:  LLQ abdominal pain diarrhea, abdominal pain, recent hysteroscopy EXAM: CT ABDOMEN AND PELVIS WITH CONTRAST TECHNIQUE: Multidetector CT imaging of the abdomen and pelvis  was performed using the standard protocol following bolus administration of intravenous contrast. RADIATION DOSE REDUCTION: This exam was performed according to the departmental dose-optimization program which includes automated exposure control, adjustment of the mA and/or kV according to patient size and/or use of iterative reconstruction technique. CONTRAST:  75mL OMNIPAQUE IOHEXOL 350 MG/ML SOLN COMPARISON:  CT scan abdomen and pelvis from 03/18/2023. FINDINGS: Lower chest: There are patchy atelectatic changes in the visualized lung bases. No overt consolidation. No pleural effusion. The heart is normal in size. No pericardial effusion. Hepatobiliary: The liver is normal in size. Non-cirrhotic configuration. No suspicious mass. No intrahepatic or extrahepatic bile duct dilation. No calcified gallstones. Normal gallbladder wall thickness. No pericholecystic inflammatory changes. Pancreas: Unremarkable. No pancreatic ductal dilatation or surrounding inflammatory changes. Spleen: Within normal limits. No focal lesion. Adrenals/Urinary Tract: Adrenal glands are unremarkable. No suspicious renal mass. No hydronephrosis. No renal or ureteric calculi. Unremarkable urinary bladder. Stomach/Bowel: There is a small sliding hiatal hernia. No disproportionate dilation of the small or large bowel loops. No evidence of abnormal bowel wall thickening or inflammatory changes. The appendix is unremarkable. Vascular/Lymphatic: No ascites or pneumoperitoneum. Redemonstration of slightly prominent periportal/porta hepatic lymph nodes, without significant interval change, likely benign/reactive. No aneurysmal dilation of the major abdominal arteries. Reproductive: Bulky anteverted uterus, similar to the prior study, not well evaluated on the current exam. Please refer to ultrasound pelvis report from 03/08/2023 for details.  T-shaped intrauterine device is in abnormal position. Removal is recommended. No large adnexal mass seen.  Other: There is a tiny fat containing umbilical hernia. The soft tissues and abdominal wall are otherwise unremarkable. Musculoskeletal: No suspicious osseous lesions. There are mild multilevel degenerative changes in the visualized spine. IMPRESSION: 1. No acute inflammatory process identified within the abdomen or pelvis. 2. Bulky anteverted uterus, similar to the prior study, not well evaluated on the current exam. T-shaped intrauterine device is in abnormal position. Removal is recommended. 3. Multiple other nonacute observations, as described above. Electronically Signed   By: Jules Schick M.D.   On: 05/15/2023 10:37     Assessment & Plan:   Controlled type 2 diabetes mellitus without complication, with long-term current use of insulin (HCC) - POCT glycosylated hemoglobin (Hb A1C) - AMB Referral to Pharmacy Medication Management   2. Chronic pain syndrome  - Ambulatory referral to Pain Clinic   Follow up:  Follow up in 3 months     Ivonne Andrew, NP 06/11/2023

## 2023-06-11 NOTE — Assessment & Plan Note (Signed)
-   POCT glycosylated hemoglobin (Hb A1C) - AMB Referral to Pharmacy Medication Management   2. Chronic pain syndrome  - Ambulatory referral to Pain Clinic   Follow up:  Follow up in 3 months

## 2023-06-11 NOTE — Progress Notes (Signed)
Patient states no to having depression/SI

## 2023-06-11 NOTE — Telephone Encounter (Signed)
Sent to Dr Maisie Fus as he was last to fill.

## 2023-06-12 ENCOUNTER — Other Ambulatory Visit: Payer: Self-pay

## 2023-06-13 ENCOUNTER — Telehealth: Payer: Self-pay

## 2023-06-13 ENCOUNTER — Other Ambulatory Visit (HOSPITAL_COMMUNITY): Payer: Self-pay

## 2023-06-13 ENCOUNTER — Other Ambulatory Visit: Payer: Self-pay

## 2023-06-13 NOTE — Telephone Encounter (Signed)
Per Pollyann Glen:  Jill Shaw, has been weaned off her narcotics due to narcotic contract violation. Jill Shaw was aware of the above months ago and was instructed to ask her PCP for another referral. Jill Shaw was also given a letter regarding the above, Jill Shaw

## 2023-06-13 NOTE — Progress Notes (Signed)
   Care Guide Note  06/13/2023 Name: DANENE SANTAMARINA MRN: [id number] DOB: 1976-06-18  Referred by: Ivonne Andrew, NP Reason for referral : Care Coordination (Outreach to schedule with Pharm d )   LOURAINE HERBST is a 47 y.o. year old female who is a primary care patient of Ivonne Andrew, NP. RUPERTA REDLER was referred to the pharmacist for assistance related to DM.    An unsuccessful telephone outreach was attempted today to contact the patient who was referred to the pharmacy team for assistance with medication management. Additional attempts will be made to contact the patient.   Penne Lash, RMA Care Guide [facility]  [place], [place] [address] Direct Dial: [phone] [email or web address]

## 2023-06-14 ENCOUNTER — Other Ambulatory Visit: Payer: Self-pay

## 2023-06-15 ENCOUNTER — Encounter: Payer: Medicare Other | Admitting: Registered Nurse

## 2023-06-15 ENCOUNTER — Other Ambulatory Visit (HOSPITAL_COMMUNITY): Payer: Self-pay

## 2023-06-15 ENCOUNTER — Other Ambulatory Visit: Payer: Self-pay

## 2023-06-15 DIAGNOSIS — M545 Low back pain, unspecified: Secondary | ICD-10-CM | POA: Diagnosis not present

## 2023-06-15 DIAGNOSIS — Z79899 Other long term (current) drug therapy: Secondary | ICD-10-CM | POA: Diagnosis not present

## 2023-06-15 MED ORDER — OXYCODONE-ACETAMINOPHEN 5-325 MG PO TABS
1.0000 | ORAL_TABLET | Freq: Two times a day (BID) | ORAL | 0 refills | Status: DC | PRN
  Filled 2023-06-15 (×2): qty 10, 5d supply, fill #0

## 2023-06-19 ENCOUNTER — Other Ambulatory Visit: Payer: Self-pay

## 2023-06-19 ENCOUNTER — Other Ambulatory Visit: Payer: Self-pay | Admitting: Nurse Practitioner

## 2023-06-19 MED ORDER — TIZANIDINE HCL 4 MG PO TABS
4.0000 mg | ORAL_TABLET | Freq: Four times a day (QID) | ORAL | 0 refills | Status: DC | PRN
  Filled 2023-06-19: qty 20, 5d supply, fill #0

## 2023-06-19 MED ORDER — TOUJEO MAX SOLOSTAR 300 UNIT/ML ~~LOC~~ SOPN
60.0000 [IU] | PEN_INJECTOR | Freq: Every morning | SUBCUTANEOUS | 2 refills | Status: DC
  Filled 2023-06-19: qty 6, 30d supply, fill #0

## 2023-06-20 ENCOUNTER — Other Ambulatory Visit: Payer: Self-pay

## 2023-06-20 DIAGNOSIS — Z79899 Other long term (current) drug therapy: Secondary | ICD-10-CM | POA: Diagnosis not present

## 2023-06-20 NOTE — Progress Notes (Signed)
   Care Guide Note  06/20/2023 Name: DENASHA GETTIS MRN: [id number] DOB: 30-Jan-1976  Referred by: Ivonne Andrew, NP Reason for referral : Care Coordination (Outreach to schedule with Pharm d )   ANNALIA LEZON is a 47 y.o. year old female who is a primary care patient of Ivonne Andrew, NP. MUNIRA HAAPALA was referred to the pharmacist for assistance related to DM.    A second unsuccessful telephone outreach was attempted today to contact the patient who was referred to the pharmacy team for assistance with medication management. Additional attempts will be made to contact the patient.  Penne Lash, RMA Care Guide [facility]  [place], [place] [address] Direct Dial: [phone] [email or web address]

## 2023-06-21 NOTE — Progress Notes (Signed)
This encounter was created in error - please disregard.

## 2023-06-22 ENCOUNTER — Other Ambulatory Visit: Payer: Self-pay

## 2023-06-22 ENCOUNTER — Other Ambulatory Visit: Payer: Self-pay | Admitting: Nurse Practitioner

## 2023-06-22 MED ORDER — TIZANIDINE HCL 4 MG PO TABS
4.0000 mg | ORAL_TABLET | Freq: Three times a day (TID) | ORAL | 0 refills | Status: DC | PRN
Start: 2023-06-22 — End: 2023-07-20
  Filled 2023-06-22 – 2023-06-25 (×2): qty 90, 30d supply, fill #0

## 2023-06-22 NOTE — Telephone Encounter (Signed)
Please refuse. Thanks [employer]

## 2023-06-23 ENCOUNTER — Other Ambulatory Visit: Payer: Self-pay

## 2023-06-23 ENCOUNTER — Encounter (HOSPITAL_COMMUNITY): Payer: Self-pay | Admitting: Emergency Medicine

## 2023-06-23 ENCOUNTER — Emergency Department (HOSPITAL_COMMUNITY)
Admission: EM | Admit: 2023-06-23 | Discharge: 2023-06-24 | Disposition: A | Discharge status: Home / Self Care | Payer: Medicare Other | Attending: Emergency Medicine | Admitting: Emergency Medicine

## 2023-06-23 DIAGNOSIS — Z7984 Long term (current) use of oral hypoglycemic drugs: Secondary | ICD-10-CM | POA: Diagnosis not present

## 2023-06-23 DIAGNOSIS — G8929 Other chronic pain: Secondary | ICD-10-CM

## 2023-06-23 DIAGNOSIS — I130 Hypertensive heart and chronic kidney disease with heart failure and stage 1 through stage 4 chronic kidney disease, or unspecified chronic kidney disease: Secondary | ICD-10-CM | POA: Insufficient documentation

## 2023-06-23 DIAGNOSIS — Z79899 Other long term (current) drug therapy: Secondary | ICD-10-CM | POA: Diagnosis not present

## 2023-06-23 DIAGNOSIS — E1165 Type 2 diabetes mellitus with hyperglycemia: Secondary | ICD-10-CM | POA: Diagnosis not present

## 2023-06-23 DIAGNOSIS — D72829 Elevated white blood cell count, unspecified: Secondary | ICD-10-CM | POA: Diagnosis not present

## 2023-06-23 DIAGNOSIS — N183 Chronic kidney disease, stage 3 unspecified: Secondary | ICD-10-CM | POA: Insufficient documentation

## 2023-06-23 DIAGNOSIS — R109 Unspecified abdominal pain: Secondary | ICD-10-CM | POA: Insufficient documentation

## 2023-06-23 DIAGNOSIS — R079 Chest pain, unspecified: Secondary | ICD-10-CM | POA: Diagnosis present

## 2023-06-23 DIAGNOSIS — E1122 Type 2 diabetes mellitus with diabetic chronic kidney disease: Secondary | ICD-10-CM | POA: Insufficient documentation

## 2023-06-23 DIAGNOSIS — R072 Precordial pain: Secondary | ICD-10-CM

## 2023-06-23 DIAGNOSIS — Z794 Long term (current) use of insulin: Secondary | ICD-10-CM | POA: Diagnosis not present

## 2023-06-23 DIAGNOSIS — I5032 Chronic diastolic (congestive) heart failure: Secondary | ICD-10-CM | POA: Insufficient documentation

## 2023-06-23 DIAGNOSIS — J449 Chronic obstructive pulmonary disease, unspecified: Secondary | ICD-10-CM | POA: Insufficient documentation

## 2023-06-23 NOTE — ED Triage Notes (Signed)
Pt reports CP, SHOB, HA, and abdominal pain as well as bilateral foot pain. Pt states she feels her headache is BP related.  Did take a nitroglycerin pill PTA which gave her minimal relief.

## 2023-06-24 ENCOUNTER — Emergency Department (HOSPITAL_COMMUNITY): Payer: Medicare Other

## 2023-06-24 DIAGNOSIS — J9811 Atelectasis: Secondary | ICD-10-CM | POA: Diagnosis not present

## 2023-06-24 DIAGNOSIS — R072 Precordial pain: Secondary | ICD-10-CM | POA: Diagnosis not present

## 2023-06-24 DIAGNOSIS — R079 Chest pain, unspecified: Secondary | ICD-10-CM | POA: Diagnosis not present

## 2023-06-24 LAB — URINALYSIS, ROUTINE W REFLEX MICROSCOPIC
Bilirubin Urine: NEGATIVE
Glucose, UA: NEGATIVE mg/dL
Hgb urine dipstick: NEGATIVE
Ketones, ur: NEGATIVE mg/dL
Leukocytes,Ua: NEGATIVE
Nitrite: NEGATIVE
Protein, ur: NEGATIVE mg/dL
Specific Gravity, Urine: 1.004 — ABNORMAL LOW (ref 1.005–1.030)
pH: 5 (ref 5.0–8.0)

## 2023-06-24 LAB — CBC
HCT: 37.9 % (ref 36.0–46.0)
Hemoglobin: 12.5 g/dL (ref 12.0–15.0)
MCH: 27.5 pg (ref 26.0–34.0)
MCHC: 33 g/dL (ref 30.0–36.0)
MCV: 83.5 fL (ref 80.0–100.0)
Platelets: 187 10*3/uL (ref 150–400)
RBC: 4.54 MIL/uL (ref 3.87–5.11)
RDW: 17.5 % — ABNORMAL HIGH (ref 11.5–15.5)
WBC: 11.6 10*3/uL — ABNORMAL HIGH (ref 4.0–10.5)
nRBC: 0 % (ref 0.0–0.2)

## 2023-06-24 LAB — BASIC METABOLIC PANEL
Anion gap: 13 (ref 5–15)
BUN: 7 mg/dL (ref 6–20)
CO2: 18 mmol/L — ABNORMAL LOW (ref 22–32)
Calcium: 9.2 mg/dL (ref 8.9–10.3)
Chloride: 112 mmol/L — ABNORMAL HIGH (ref 98–111)
Creatinine, Ser: 1.4 mg/dL — ABNORMAL HIGH (ref 0.44–1.00)
GFR, Estimated: 47 mL/min — ABNORMAL LOW (ref >60)
Glucose, Bld: 217 mg/dL — ABNORMAL HIGH (ref 70–99)
Potassium: 4 mmol/L (ref 3.5–5.1)
Sodium: 143 mmol/L (ref 135–145)

## 2023-06-24 LAB — BRAIN NATRIURETIC PEPTIDE: B Natriuretic Peptide: 196.5 pg/mL — ABNORMAL HIGH (ref 0.0–100.0)

## 2023-06-24 LAB — HCG, SERUM, QUALITATIVE: Preg, Serum: NEGATIVE

## 2023-06-24 LAB — TROPONIN I (HIGH SENSITIVITY): Troponin I (High Sensitivity): 16 ng/L (ref <18)

## 2023-06-24 MED ORDER — METOCLOPRAMIDE HCL 5 MG/ML IJ SOLN
10.0000 mg | Freq: Once | INTRAMUSCULAR | Status: AC
  Administered 2023-06-24: 10 mg via INTRAVENOUS
  Filled 2023-06-24: qty 2

## 2023-06-24 MED ORDER — OXYCODONE-ACETAMINOPHEN 5-325 MG PO TABS
1.0000 | ORAL_TABLET | ORAL | Status: DC | PRN
  Administered 2023-06-24: 1 via ORAL
  Filled 2023-06-24: qty 1

## 2023-06-24 MED ORDER — DIPHENHYDRAMINE HCL 50 MG/ML IJ SOLN
25.0000 mg | Freq: Once | INTRAMUSCULAR | Status: AC
  Administered 2023-06-24: 25 mg via INTRAVENOUS
  Filled 2023-06-24: qty 1

## 2023-06-24 MED ORDER — HYDROCODONE-ACETAMINOPHEN 5-325 MG PO TABS
2.0000 | ORAL_TABLET | Freq: Once | ORAL | Status: AC
  Administered 2023-06-24: 2 via ORAL
  Filled 2023-06-24: qty 2

## 2023-06-24 MED ORDER — IBUPROFEN 400 MG PO TABS: 400.0000 mg | ORAL_TABLET | Freq: Three times a day (TID) | ORAL | 0 refills | Status: DC | PRN

## 2023-06-24 MED ORDER — ONDANSETRON HCL 4 MG/2ML IJ SOLN
4.0000 mg | Freq: Once | INTRAMUSCULAR | Status: AC
  Administered 2023-06-24: 4 mg via INTRAVENOUS
  Filled 2023-06-24: qty 2

## 2023-06-24 NOTE — Discharge Instructions (Addendum)
Your caregiver has diagnosed you as having chest pain that is not specific for one problem, but does not require admission.  Chest pain comes from many different causes.  °SEEK IMMEDIATE MEDICAL ATTENTION IF: °You have severe chest pain, especially if the pain is crushing or pressure-like and spreads to the arms, back, neck, or jaw, or if you have sweating, nausea (feeling sick to your stomach), or shortness of breath. THIS IS AN EMERGENCY. Don't wait to see if the pain will go away. Get medical help at once. Call 911 or 0 (operator). DO NOT drive yourself to the hospital.  °Your chest pain gets worse and does not go away with rest.  °You have an attack of chest pain lasting longer than usual, despite rest and treatment with the medications your caregiver has prescribed.  °You wake from sleep with chest pain or shortness of breath.  °You feel dizzy or faint.  °You have chest pain not typical of your usual pain for which you originally saw your caregiver. ° °

## 2023-06-24 NOTE — ED Provider Notes (Signed)
[place] EMERGENCY DEPARTMENT AT [facility] Provider Note   CSN: [id number] Arrival date & time: 06/23/23  2339     History  Chief Complaint  Patient presents with   Chest Pain    Jill Shaw is a 47 y.o. female.  The history is provided by the patient.  Patient with extensive history including chronic pain, diastolic heart failure, diabetic gastroparesis, obesity presents with multiple complaints. Patient reports over 3 days ago she started having generalized abdominal pain and nausea consistent with her gastroparesis.  She has not been able to take all of her medications.  She also reports diarrhea.  No fevers She reports over the past days she is now having chest pain, headache, back pain as well as bilateral foot pain.  Most of the symptoms are similar to prior episodes of pain flareups.  She also reports feeling short of breath She took nitroglycerin prior to arrival to help with her symptoms and only received minimal relief No recent falls or injuries reported   Past Medical History:  Diagnosis Date   Abnormal uterine bleeding (AUB)    Arthritis    knees, hands   Atypical chest pain    cardiology--- dr Rosemary Holms did cardiac cath 08-30-2021 showed minimal luminal irregularity involving LAD all other coronaries w/ normal  flow   Chronic diastolic (congestive) heart failure [facility])    cardiologist---- dr Rosemary Holms;  preserved ef   Chronic iron deficiency anemia    Chronic pain syndrome    followed by pain management--- dr Carlis Abbott   CKD (chronic kidney disease), stage III (HCC)    Diabetic gastroparesis (HCC)    Diabetic peripheral neuropathy (HCC)    Fibromyalgia    GAD (generalized anxiety disorder)    Generalized abdominal pain    GERD (gastroesophageal reflux disease)    History of acute renal failure 03/21/2023   admission in epic due to N/V/D due ot severe sepsis POA due to UTI   History of chronic gastritis    inflammatory   History of diabetic  ketoacidosis    multiple admission's last 3 in epic 06/ 2022;  01/ 2022;   09/ 2021   History of seizure 01/2016   hypoglycemic seizure   History of vertebral compression fracture 12/2020   T11 -- T12 & L1   Hyperlipidemia    Hypertension    followed by pcp   Insulin dependent type 2 diabetes mellitus (HCC)    uncontrolled,  followed by pcp   Irritable bowel syndrome with constipation    MDD (major depressive disorder)    Moderate COPD (chronic obstructive pulmonary disease) (HCC)    pulmology--- dr Tonia Brooms   Moderate persistent asthma    Sickle cell trait (HCC)    Vitamin D deficiency 10/2019   Wears glasses     Home Medications Prior to Admission medications   Medication Sig Start Date End Date Taking? Authorizing Provider  ibuprofen (ADVIL) 400 MG tablet Take 1 tablet (400 mg total) by mouth every 8 (eight) hours as needed for moderate pain. 06/24/23  Yes Zadie Rhine, MD  Accu-Chek Softclix Lancets lancets USE TO TEST AS DIRECTED UP TO FOUR TIMES DAILY. 03/06/22   Shamleffer, Konrad Dolores, MD  albuterol (VENTOLIN HFA) 108 (90 Base) MCG/ACT inhaler Inhale 2 puffs into the lungs every 6 (six) hours as needed for wheezing or shortness of breath. 07/18/21   Luciano Cutter, MD  B-D ULTRAFINE III SHORT PEN 31G X 8 MM MISC USE AS DIRECTED EVERY  MORNING, NOON, EVENING, AND AT BEDTIME 07/10/22   Shamleffer, Konrad Dolores, MD  Continuous Blood Gluc Sensor (FREESTYLE LIBRE 3 SENSOR) MISC Place 1 sensor on the skin every 14 days. Use to check glucose continuously 07/06/22   Ivonne Andrew, NP  Dexlansoprazole 30 MG capsule DR TAKE 1 CAPSULE (30 MG TOTAL) BY MOUTH DAILY. NEEDS OFFICE VISIT FOR ADDITIONAL REFILLS 03/26/23   Ivonne Andrew, NP  dicyclomine (BENTYL) 20 MG tablet Take 1 tablet (20 mg total) by mouth 2 (two) times daily. 06/05/23   Ivonne Andrew, NP  furosemide (LASIX) 40 MG tablet TAKE 1 TABLET(40 MG) BY MOUTH TWICE DAILY Patient taking differently: Take 40 mg by mouth 2  (two) times daily. 01/19/23   Patwardhan, Manish J, MD  glucose blood (ACCU-CHEK GUIDE) test strip USE TO TEST BLOOD GLUCOSE AS DIRECTED UP TO 4 TIMES DAILY 11/14/22   Ivonne Andrew, NP  HUMALOG KWIKPEN 200 UNIT/ML KwikPen Inject 50 Units into the skin 3 (three) times daily before meals. 06/05/23   Ivonne Andrew, NP  hydrochlorothiazide (MICROZIDE) 12.5 MG capsule TAKE 1 CAPSULE(12.5 MG) BY MOUTH DAILY Patient taking differently: Take 12.5 mg by mouth daily. 12/14/21   Cantwell, Celeste C, PA-C  losartan (COZAAR) 100 MG tablet TAKE 1 TABLET BY MOUTH EVERY DAY 04/30/23   Ivonne Andrew, NP  metoCLOPramide (REGLAN) 10 MG tablet Take 1 tablet (10 mg total) by mouth every 6 (six) hours as needed for nausea. 01/17/23   Al Decant, PA-C  metoCLOPramide (REGLAN) 10 MG tablet Take 1 tablet (10 mg total) by mouth every 8 (eight) hours as needed for nausea. 05/12/23   Tilden Fossa, MD  metoprolol succinate (TOPROL-XL) 100 MG 24 hr tablet Take 100 mg by mouth daily. Take with or immediately following a meal.    [provider]  nitroGLYCERIN (NITROSTAT) 0.4 MG SL tablet Place 1 tablet (0.4 mg total) under the tongue every 5 (five) minutes as needed for chest pain. Additional refills to be filled by PCP, patient aware 04/26/23   Yates Decamp, MD  norethindrone (AYGESTIN) 5 MG tablet Take 1 tablet (5 mg total) by mouth 3 (three) times daily. 05/22/23   Lorriane Shire, MD  ondansetron (ZOFRAN) 4 MG tablet Take 1 tablet (4 mg total) by mouth every 8 (eight) hours as needed for nausea or vomiting. 06/06/23   Ivonne Andrew, NP  Pimozide 1 MG TABS Take by mouth. 02/10/23   [provider]  potassium chloride SA (KLOR-CON M) 20 MEQ tablet Take 1 tablet (20 mEq total) by mouth daily. 03/19/23   Ivonne Andrew, NP  rosuvastatin (CRESTOR) 20 MG tablet TAKE 1 TABLET(20 MG) BY MOUTH DAILY 01/19/23   Patwardhan, Anabel Bene, MD  Simethicone 180 MG CAPS Take 1 capsule (180 mg total) by mouth 3 (three)  times daily as needed. 05/12/23   Tilden Fossa, MD  SPIRIVA RESPIMAT 2.5 MCG/ACT AERS INHALE 2 PUFFS INTO THE LUNGS DAILY 02/03/22   Icard, Rachel Bo, DO  spironolactone (ALDACTONE) 50 MG tablet TAKE 1 TABLET(50 MG) BY MOUTH DAILY 12/22/20   Patwardhan, Anabel Bene, MD  SYMBICORT 160-4.5 MCG/ACT inhaler INHALE 2 PUFFS INTO THE LUNGS TWICE DAILY 10/31/22   Icard, Rachel Bo, DO  tiZANidine (ZANAFLEX) 4 MG tablet Take 1 tablet (4 mg total) by mouth every 8 (eight) hours as needed for muscle spasms. 06/22/23   Ivonne Andrew, NP  TOUJEO MAX SOLOSTAR 300 UNIT/ML Solostar Pen Inject 60 Units into the skin  in the morning. 06/19/23   Ivonne Andrew, NP      Allergies    Elavil [amitriptyline], Orudis [ketoprofen], Desyrel [trazodone], Tylenol [acetaminophen], Aspirin, Motrin [ibuprofen], Naprosyn [naproxen], Neurontin [gabapentin], Sulfa antibiotics, Ultram [tramadol], Victoza [liraglutide], and Zegerid [omeprazole-sodium bicarbonate]    Review of Systems   Review of Systems  Constitutional:  Negative for fever.  Respiratory:  Positive for shortness of breath.   Cardiovascular:  Positive for chest pain.  Gastrointestinal:  Positive for abdominal pain and nausea.    Physical Exam Updated Vital Signs BP (!) 166/114   Pulse 96   Temp 98.4 F (36.9 C) (Oral)   Resp (!) 25   SpO2 100%  Physical Exam CONSTITUTIONAL: Chronically ill-appearing, anxious HEAD: Normocephalic/atraumatic EYES: EOMI/PERRL ENMT: Mucous membranes moist, no stridor, no drooling NECK: supple no meningeal signs SPINE/BACK:entire spine nontender CV: S1/S2 noted, no murmurs/rubs/gallops noted LUNGS: Lungs are clear to auscultation bilaterally, no apparent distress ABDOMEN: soft, nontender, no rebound or guarding, bowel sounds noted throughout abdomen, obese GU:no cva tenderness NEURO: Pt is awake/alert/appropriate, moves all extremitiesx4.  No facial droop.  No arm or leg drift.  Equal handgrips noted. Mental status is  appropriate EXTREMITIES: pulses normal/equalx4, full ROM No deformities noted lower extremities.  Distal pulses equal and intact.  No significant edema.  No overlying erythema. SKIN: warm, color normal PSYCH: Anxious   ED Results / Procedures / Treatments   Labs (all labs ordered are listed, but only abnormal results are displayed) Labs Reviewed  BASIC METABOLIC PANEL - Abnormal; Notable for the following components:      Result Value   Chloride 112 (*)    CO2 18 (*)    Glucose, Bld 217 (*)    Creatinine, Ser 1.40 (*)    GFR, Estimated 47 (*)    All other components within normal limits  CBC - Abnormal; Notable for the following components:   WBC 11.6 (*)    RDW 17.5 (*)    All other components within normal limits  BRAIN NATRIURETIC PEPTIDE - Abnormal; Notable for the following components:   B Natriuretic Peptide 196.5 (*)    All other components within normal limits  URINALYSIS, ROUTINE W REFLEX MICROSCOPIC - Abnormal; Notable for the following components:   Color, Urine COLORLESS (*)    Specific Gravity, Urine 1.004 (*)    All other components within normal limits  HCG, SERUM, QUALITATIVE  TROPONIN I (HIGH SENSITIVITY)    EKG EKG Interpretation Date/Time:  Saturday June 23 2023 23:51:14 EDT Ventricular Rate:  98 PR Interval:  142 QRS Duration:  80 QT Interval:  346 QTC Calculation: 441 R Axis:   60  Text Interpretation: Normal sinus rhythm Low voltage QRS Septal infarct , age undetermined No significant change since last tracing Confirmed by Zadie Rhine ([id number]) on 06/23/2023 11:59:01 PM  Radiology DG Chest 2 View  Result Date: 06/24/2023 CLINICAL DATA:  Chest pain for 2 days. EXAM: CHEST - 2 VIEW COMPARISON:  March 18, 2023 FINDINGS: The cardiac silhouette is mildly enlarged and unchanged in size. Mild atelectatic changes are seen within the bilateral lung bases. No pleural effusion or pneumothorax is identified. The visualized skeletal structures are  unremarkable. IMPRESSION: Mild bibasilar atelectasis. Electronically Signed   By: Aram Candela M.D.   On: 06/24/2023 01:20    Procedures Procedures    Medications Ordered in ED Medications  oxyCODONE-acetaminophen (PERCOCET/ROXICET) 5-325 MG per tablet 1 tablet (1 tablet Oral Given 06/24/23 0005)  HYDROcodone-acetaminophen (NORCO/VICODIN) 5-325 MG per tablet  2 tablet (has no administration in time range)  ondansetron (ZOFRAN) injection 4 mg (has no administration in time range)  metoCLOPramide (REGLAN) injection 10 mg (10 mg Intravenous Given 06/24/23 0104)  diphenhydrAMINE (BENADRYL) injection 25 mg (25 mg Intravenous Given 06/24/23 0107)    ED Course/ Medical Decision Making/ A&P Clinical Course as of 06/24/23 0309  Sun Jun 24, 2023  0037 WBC(!): 11.6 Mild leukocytosis [DW]  0037 Patient with history of chronic pain, gastroparesis presents with multiple complaints.  Patient reports it feels like her gastroparesis is worsening which is triggering pain throughout her body.  She also reports difficulty taking all of her medicines.  She also admits to just running out of her chronic pain medicines and is in between pain specialist. Plan to focus on treating her gastroparesis.  Per records, patient has history of cardiac cath in 2022 that essentially showed normal coronaries.  she is also had multiple CT imaging scans in the past [DW]  0058 Glucose(!): 217 Hyperglycemia without anion gap [DW]  0159 Patient resting comfortably.  Appears to be improved [DW]  0229 Patient sleeping, but after knocking on the door and walking in she starts hyperventilating reports pain all over her body Her vital signs of improved.  Overall.  This is likely just exacerbation of her chronic pain.  Patient is requesting prescription for pain medicine at discharge.  Advised her I am unable to provide any narcotics for chronic pain at as an outpatient.  I will give her an oral dose of narcotics here to ensure she can  keep down p.o. [DW]  0308 Patient improved, up walking around. She is requesting ibuprofen.  She reports she used to have nausea with it but she not been tolerated.  Will give short course at her request but did advise to limit her use due to underlying renal insufficiency. Advised follow-up with her pain specialist [DW]    Clinical Course User Index [DW] Zadie Rhine, MD                                 Medical Decision Making Amount and/or Complexity of Data Reviewed Labs: ordered. Decision-making details documented in ED Course. Radiology: ordered.  Risk Prescription drug management.   This patient presents to the ED for concern of abdominal pain, this involves an extensive number of treatment options, and is a complaint that carries with it a high risk of complications and morbidity.  The differential diagnosis includes but is not limited to cholecystitis, cholelithiasis, pancreatitis, gastritis, peptic ulcer disease, appendicitis, bowel obstruction, bowel perforation, diverticulitis, AAA, ischemic bowel    Comorbidities that complicate the patient evaluation: Patient's presentation is complicated by their history of chronic pain, diabetes, hypertension, obesity  Social Determinants of Health: Patient's  history of chronic pain   increases the complexity of managing their presentation  Additional history obtained: Records reviewed previous admission documents  Lab Tests: I Ordered, and personally interpreted labs.  The pertinent results include: Hyperglycemia without anion gap  Imaging Studies ordered: I ordered imaging studies including X-ray chest   I independently visualized and interpreted imaging which showed no acute findings I agree with the radiologist interpretation  Cardiac Monitoring: The patient was maintained on a cardiac monitor.  I personally viewed and interpreted the cardiac monitor which showed an underlying rhythm of:  sinus tachycardia  Medicines  ordered and prescription drug management: I ordered medication including Reglan and Benadryl for abdominal pain  and nausea Reevaluation of the patient after these medicines showed that the patient    improved  Reevaluation: After the interventions noted above, I reevaluated the patient and found that they have :improved  Complexity of problems addressed: Patient's presentation is most consistent with  exacerbation of chronic illness  Disposition: After consideration of the diagnostic results and the patient's response to treatment,  I feel that the patent would benefit from discharge   .           Final Clinical Impression(s) / ED Diagnoses Final diagnoses:  Precordial pain  Chronic abdominal pain    Rx / DC Orders ED Discharge Orders          Ordered    ibuprofen (ADVIL) 400 MG tablet  Every 8 hours PRN        06/24/23 0308              Zadie Rhine, MD 06/24/23 0310

## 2023-06-25 ENCOUNTER — Other Ambulatory Visit: Payer: Self-pay

## 2023-06-25 NOTE — Progress Notes (Signed)
   Care Guide Note  06/25/2023 Name: Jill Shaw MRN: [id number] DOB: [birth year]  Referred by: Ivonne Andrew, NP Reason for referral : Care Coordination (Outreach to schedule with Pharm d )   Jill Shaw is a 47 y.o. year old female who is a primary care patient of Ivonne Andrew, NP. Jill Shaw was referred to the pharmacist for assistance related to DM.    A third unsuccessful telephone outreach was attempted today to contact the patient who was referred to the pharmacy team for assistance with medication management. The Population Health team is pleased to engage with this patient at any time in the future upon receipt of referral and should he/she be interested in assistance from the [facility] team.   Penne Lash, RMA Care Guide [facility]  [place], [place] [address] Direct Dial: [phone] [email or web address]

## 2023-06-26 ENCOUNTER — Other Ambulatory Visit: Payer: Self-pay

## 2023-06-27 ENCOUNTER — Other Ambulatory Visit: Payer: Self-pay

## 2023-06-27 ENCOUNTER — Other Ambulatory Visit (HOSPITAL_BASED_OUTPATIENT_CLINIC_OR_DEPARTMENT_OTHER): Payer: Self-pay

## 2023-06-28 ENCOUNTER — Other Ambulatory Visit: Payer: Self-pay

## 2023-07-02 ENCOUNTER — Telehealth: Payer: Self-pay

## 2023-07-02 ENCOUNTER — Other Ambulatory Visit: Payer: Self-pay | Admitting: Physical Medicine and Rehabilitation

## 2023-07-02 NOTE — Patient Outreach (Signed)
Care Guide Note  07/02/2023 Name: Jill Shaw MRN: [id number] DOB: [birth year]  Referred by: Ivonne Andrew, NP Reason for referral : patient outreach (Outreach to schedule with RPH. )   Jill Shaw is a 47 y.o. year old female who is a primary care patient of Ivonne Andrew, NP. Jill Shaw was referred to the pharmacist for assistance related to HTN.    An unsuccessful telephone outreach was attempted today to contact the patient who was referred to the pharmacy team for assistance with HTN. Additional attempts will be made to contact the patient.   Baruch Gouty [facility] Assistant-Population Health [phone]

## 2023-07-04 ENCOUNTER — Other Ambulatory Visit: Payer: Self-pay | Admitting: Nurse Practitioner

## 2023-07-04 MED ORDER — NICOTINE 21 MG/24HR TD PT24
21.0000 mg | MEDICATED_PATCH | TRANSDERMAL | 1 refills | Status: DC
Start: 2023-07-04 — End: 2023-10-11
  Filled 2023-07-04: qty 28, 28d supply, fill #0
  Filled 2023-08-13: qty 28, 28d supply, fill #1
  Filled 2023-09-26 – 2023-10-11 (×2): qty 28, 28d supply, fill #2

## 2023-07-05 ENCOUNTER — Other Ambulatory Visit: Payer: Self-pay

## 2023-07-07 ENCOUNTER — Emergency Department (HOSPITAL_BASED_OUTPATIENT_CLINIC_OR_DEPARTMENT_OTHER)
Admission: EM | Admit: 2023-07-07 | Discharge: 2023-07-07 | Disposition: A | Discharge status: Home / Self Care | Payer: Medicare Other | Attending: Emergency Medicine | Admitting: Emergency Medicine

## 2023-07-07 ENCOUNTER — Encounter (HOSPITAL_BASED_OUTPATIENT_CLINIC_OR_DEPARTMENT_OTHER): Payer: Self-pay | Admitting: Emergency Medicine

## 2023-07-07 ENCOUNTER — Other Ambulatory Visit: Payer: Self-pay

## 2023-07-07 ENCOUNTER — Emergency Department (HOSPITAL_BASED_OUTPATIENT_CLINIC_OR_DEPARTMENT_OTHER): Payer: Medicare Other | Admitting: Radiology

## 2023-07-07 ENCOUNTER — Emergency Department (HOSPITAL_BASED_OUTPATIENT_CLINIC_OR_DEPARTMENT_OTHER): Payer: Medicare Other

## 2023-07-07 DIAGNOSIS — Z79899 Other long term (current) drug therapy: Secondary | ICD-10-CM | POA: Diagnosis not present

## 2023-07-07 DIAGNOSIS — R079 Chest pain, unspecified: Secondary | ICD-10-CM | POA: Insufficient documentation

## 2023-07-07 DIAGNOSIS — G8929 Other chronic pain: Secondary | ICD-10-CM | POA: Diagnosis not present

## 2023-07-07 DIAGNOSIS — E162 Hypoglycemia, unspecified: Secondary | ICD-10-CM

## 2023-07-07 DIAGNOSIS — I517 Cardiomegaly: Secondary | ICD-10-CM | POA: Diagnosis not present

## 2023-07-07 DIAGNOSIS — R1084 Generalized abdominal pain: Secondary | ICD-10-CM | POA: Insufficient documentation

## 2023-07-07 DIAGNOSIS — J9811 Atelectasis: Secondary | ICD-10-CM | POA: Diagnosis not present

## 2023-07-07 DIAGNOSIS — R519 Headache, unspecified: Secondary | ICD-10-CM | POA: Diagnosis not present

## 2023-07-07 LAB — CBC
HCT: 37.5 % (ref 36.0–46.0)
Hemoglobin: 12.4 g/dL (ref 12.0–15.0)
MCH: 26.6 pg (ref 26.0–34.0)
MCHC: 33.1 g/dL (ref 30.0–36.0)
MCV: 80.5 fL (ref 80.0–100.0)
Platelets: 195 10*3/uL (ref 150–400)
RBC: 4.66 MIL/uL (ref 3.87–5.11)
RDW: 18.3 % — ABNORMAL HIGH (ref 11.5–15.5)
WBC: 10.4 10*3/uL (ref 4.0–10.5)
nRBC: 0 % (ref 0.0–0.2)

## 2023-07-07 LAB — URINALYSIS, ROUTINE W REFLEX MICROSCOPIC
Bilirubin Urine: NEGATIVE
Glucose, UA: NEGATIVE mg/dL
Hgb urine dipstick: NEGATIVE
Ketones, ur: NEGATIVE mg/dL
Leukocytes,Ua: NEGATIVE
Nitrite: NEGATIVE
Protein, ur: NEGATIVE mg/dL
Specific Gravity, Urine: 1.008 (ref 1.005–1.030)
pH: 6 (ref 5.0–8.0)

## 2023-07-07 LAB — COMPREHENSIVE METABOLIC PANEL
ALT: 12 U/L (ref 0–44)
AST: 20 U/L (ref 15–41)
Albumin: 4 g/dL (ref 3.5–5.0)
Alkaline Phosphatase: 70 U/L (ref 38–126)
Anion gap: 11 (ref 5–15)
BUN: 7 mg/dL (ref 6–20)
CO2: 24 mmol/L (ref 22–32)
Calcium: 9.5 mg/dL (ref 8.9–10.3)
Chloride: 108 mmol/L (ref 98–111)
Creatinine, Ser: 1.21 mg/dL — ABNORMAL HIGH (ref 0.44–1.00)
GFR, Estimated: 56 mL/min — ABNORMAL LOW (ref >60)
Glucose, Bld: 58 mg/dL — ABNORMAL LOW (ref 70–99)
Potassium: 3.1 mmol/L — ABNORMAL LOW (ref 3.5–5.1)
Sodium: 143 mmol/L (ref 135–145)
Total Bilirubin: 0.3 mg/dL (ref 0.3–1.2)
Total Protein: 6.8 g/dL (ref 6.5–8.1)

## 2023-07-07 LAB — CBG MONITORING, ED
Glucose-Capillary: 53 mg/dL — ABNORMAL LOW (ref 70–99)
Glucose-Capillary: 124 mg/dL — ABNORMAL HIGH (ref 70–99)
Glucose-Capillary: 65 mg/dL — ABNORMAL LOW (ref 70–99)

## 2023-07-07 LAB — TROPONIN I (HIGH SENSITIVITY)
Troponin I (High Sensitivity): 9 ng/L (ref <18)
Troponin I (High Sensitivity): 6 ng/L (ref <18)

## 2023-07-07 LAB — LIPASE, BLOOD: Lipase: 22 U/L (ref 11–51)

## 2023-07-07 LAB — PREGNANCY, URINE: Preg Test, Ur: NEGATIVE

## 2023-07-07 MED ORDER — IBUPROFEN 400 MG PO TABS: 400.0000 mg | ORAL_TABLET | Freq: Three times a day (TID) | ORAL | 0 refills | Status: DC | PRN

## 2023-07-07 MED ORDER — ONDANSETRON HCL 4 MG/2ML IJ SOLN
4.0000 mg | Freq: Once | INTRAMUSCULAR | Status: AC
  Administered 2023-07-07: 4 mg via INTRAVENOUS
  Filled 2023-07-07: qty 2

## 2023-07-07 MED ORDER — DEXTROSE 50 % IV SOLN
25.0000 g | Freq: Once | INTRAVENOUS | Status: AC
  Administered 2023-07-07: 25 g via INTRAVENOUS
  Filled 2023-07-07: qty 50

## 2023-07-07 MED ORDER — SODIUM CHLORIDE 0.9 % IV BOLUS
1000.0000 mL | Freq: Once | INTRAVENOUS | Status: AC
  Administered 2023-07-07: 1000 mL via INTRAVENOUS

## 2023-07-07 MED ORDER — PROCHLORPERAZINE EDISYLATE 10 MG/2ML IJ SOLN
10.0000 mg | Freq: Once | INTRAMUSCULAR | Status: AC
  Administered 2023-07-07: 10 mg via INTRAVENOUS
  Filled 2023-07-07: qty 2

## 2023-07-07 MED ORDER — HYDROMORPHONE HCL 1 MG/ML IJ SOLN
1.0000 mg | Freq: Once | INTRAMUSCULAR | Status: AC
  Administered 2023-07-07: 1 mg via INTRAVENOUS
  Filled 2023-07-07: qty 1

## 2023-07-07 NOTE — ED Provider Notes (Signed)
[facility] AT [facility]  Provider Note  CSN: [id number] Arrival date & time: 07/07/23 0021  History Chief Complaint  Patient presents with   Abdominal Pain   Chest Pain   Leg Pain    Jill Shaw is a 47 y.o. female with history of chronic pain to multiple areas recently 'had a falling out' with the provider prescribing her chronic pain medications and now she is out. She reports having chest pain and abdominal pain as well as back and leg pain through most of the day. She took a NTG and a zofran with some improvement. No fever. She was noted to have low glucose while in triage and given PO crackers and juice which she tolerated well.    Home Medications Prior to Admission medications   Medication Sig Start Date End Date Taking? Authorizing Provider  Accu-Chek Softclix Lancets lancets USE TO TEST AS DIRECTED UP TO FOUR TIMES DAILY. 03/06/22   Shamleffer, Konrad Dolores, MD  albuterol (VENTOLIN HFA) 108 (90 Base) MCG/ACT inhaler Inhale 2 puffs into the lungs every 6 (six) hours as needed for wheezing or shortness of breath. 07/18/21   Luciano Cutter, MD  B-D ULTRAFINE III SHORT PEN 31G X 8 MM MISC USE AS DIRECTED EVERY MORNING, NOON, EVENING, AND AT BEDTIME 07/10/22   Shamleffer, Konrad Dolores, MD  Continuous Blood Gluc Sensor (FREESTYLE LIBRE 3 SENSOR) MISC Place 1 sensor on the skin every 14 days. Use to check glucose continuously 07/06/22   Ivonne Andrew, NP  Dexlansoprazole 30 MG capsule DR TAKE 1 CAPSULE (30 MG TOTAL) BY MOUTH DAILY. NEEDS OFFICE VISIT FOR ADDITIONAL REFILLS 03/26/23   Ivonne Andrew, NP  dicyclomine (BENTYL) 20 MG tablet Take 1 tablet (20 mg total) by mouth 2 (two) times daily. 06/05/23   Ivonne Andrew, NP  furosemide (LASIX) 40 MG tablet TAKE 1 TABLET(40 MG) BY MOUTH TWICE DAILY Patient taking differently: Take 40 mg by mouth 2 (two) times daily. 01/19/23   Patwardhan, Manish J, MD  glucose blood (ACCU-CHEK GUIDE) test strip  USE TO TEST BLOOD GLUCOSE AS DIRECTED UP TO 4 TIMES DAILY 11/14/22   Ivonne Andrew, NP  HUMALOG KWIKPEN 200 UNIT/ML KwikPen Inject 50 Units into the skin 3 (three) times daily before meals. 06/05/23   Ivonne Andrew, NP  hydrochlorothiazide (MICROZIDE) 12.5 MG capsule TAKE 1 CAPSULE(12.5 MG) BY MOUTH DAILY Patient taking differently: Take 12.5 mg by mouth daily. 12/14/21   Cantwell, Celeste C, PA-C  ibuprofen (ADVIL) 400 MG tablet Take 1 tablet (400 mg total) by mouth every 8 (eight) hours as needed for moderate pain. 07/07/23   Pollyann Savoy, MD  losartan (COZAAR) 100 MG tablet TAKE 1 TABLET BY MOUTH EVERY DAY 04/30/23   Ivonne Andrew, NP  metoCLOPramide (REGLAN) 10 MG tablet Take 1 tablet (10 mg total) by mouth every 6 (six) hours as needed for nausea. 01/17/23   Al Decant, PA-C  metoCLOPramide (REGLAN) 10 MG tablet Take 1 tablet (10 mg total) by mouth every 8 (eight) hours as needed for nausea. 05/12/23   Tilden Fossa, MD  metoprolol succinate (TOPROL-XL) 100 MG 24 hr tablet Take 100 mg by mouth daily. Take with or immediately following a meal.    [provider]  nicotine (NICODERM CQ - DOSED IN MG/24 HOURS) 21 mg/24hr patch Place 1 patch (21 mg total) onto the skin daily. 07/04/23 07/03/24  Ivonne Andrew, NP  nitroGLYCERIN (NITROSTAT) 0.4 MG  SL tablet Place 1 tablet (0.4 mg total) under the tongue every 5 (five) minutes as needed for chest pain. Additional refills to be filled by PCP, patient aware 04/26/23   Yates Decamp, MD  norethindrone (AYGESTIN) 5 MG tablet Take 1 tablet (5 mg total) by mouth 3 (three) times daily. 05/22/23   Lorriane Shire, MD  ondansetron (ZOFRAN) 4 MG tablet Take 1 tablet (4 mg total) by mouth every 8 (eight) hours as needed for nausea or vomiting. 06/06/23   Ivonne Andrew, NP  Pimozide 1 MG TABS Take by mouth. 02/10/23   [provider]  potassium chloride SA (KLOR-CON M) 20 MEQ tablet Take 1 tablet (20 mEq total) by mouth daily.  03/19/23   Ivonne Andrew, NP  rosuvastatin (CRESTOR) 20 MG tablet TAKE 1 TABLET(20 MG) BY MOUTH DAILY 01/19/23   Patwardhan, Anabel Bene, MD  Simethicone 180 MG CAPS Take 1 capsule (180 mg total) by mouth 3 (three) times daily as needed. 05/12/23   Tilden Fossa, MD  SPIRIVA RESPIMAT 2.5 MCG/ACT AERS INHALE 2 PUFFS INTO THE LUNGS DAILY 02/03/22   Icard, Rachel Bo, DO  spironolactone (ALDACTONE) 50 MG tablet TAKE 1 TABLET(50 MG) BY MOUTH DAILY 12/22/20   Patwardhan, Anabel Bene, MD  SYMBICORT 160-4.5 MCG/ACT inhaler INHALE 2 PUFFS INTO THE LUNGS TWICE DAILY 10/31/22   Icard, Rachel Bo, DO  tiZANidine (ZANAFLEX) 4 MG tablet Take 1 tablet (4 mg total) by mouth every 8 (eight) hours as needed for muscle spasms. 06/22/23   Ivonne Andrew, NP  tizanidine (ZANAFLEX) 6 MG capsule TAKE 1 CAPSULE BY MOUTH THREE TIMES A DAY 07/03/23   Raulkar, Drema Pry, MD  TOUJEO MAX SOLOSTAR 300 UNIT/ML Solostar Pen Inject 60 Units into the skin in the morning. 06/19/23   Ivonne Andrew, NP     Allergies    Elavil [amitriptyline], Orudis [ketoprofen], Desyrel [trazodone], Tylenol [acetaminophen], Aspirin, Motrin [ibuprofen], Naprosyn [naproxen], Neurontin [gabapentin], Sulfa antibiotics, Ultram [tramadol], Victoza [liraglutide], and Zegerid [omeprazole-sodium bicarbonate]   Review of Systems   Review of Systems Please see HPI for pertinent positives and negatives  Physical Exam BP (!) 161/73   Pulse 71   Temp 98.1 F (36.7 C)   Resp 15   SpO2 98%   Physical Exam Vitals and nursing note reviewed.  Constitutional:      Appearance: Normal appearance. She is obese.  HENT:     Head: Normocephalic and atraumatic.     Nose: Nose normal.     Mouth/Throat:     Mouth: Mucous membranes are moist.  Eyes:     Extraocular Movements: Extraocular movements intact.     Conjunctiva/sclera: Conjunctivae normal.  Cardiovascular:     Rate and Rhythm: Normal rate.  Pulmonary:     Effort: Pulmonary effort is normal.     Breath  sounds: Normal breath sounds.  Abdominal:     General: Abdomen is flat.     Palpations: Abdomen is soft.     Tenderness: There is generalized abdominal tenderness. There is no guarding.  Musculoskeletal:        General: No swelling. Normal range of motion.     Cervical back: Neck supple.  Skin:    General: Skin is warm and dry.  Neurological:     General: No focal deficit present.     Mental Status: She is alert.  Psychiatric:        Mood and Affect: Mood normal.     ED Results / Procedures /  Treatments   EKG EKG Interpretation Date/Time:  Saturday July 07 2023 00:33:06 EDT Ventricular Rate:  77 PR Interval:  150 QRS Duration:  86 QT Interval:  378 QTC Calculation: 427 R Axis:   -8  Text Interpretation: Normal sinus rhythm Low voltage QRS Septal infarct (cited on or before 23-Jun-2023) Abnormal ECG When compared with ECG of 23-Jun-2023 23:51, No significant change since last tracing Confirmed by Susy Frizzle [phone]) on 07/07/2023 12:38:16 AM  Procedures Procedures  Medications Ordered in the ED Medications  dextrose 50 % solution 25 g (25 g Intravenous Given 07/07/23 0212)  HYDROmorphone (DILAUDID) injection 1 mg (1 mg Intravenous Given 07/07/23 0212)  ondansetron (ZOFRAN) injection 4 mg (4 mg Intravenous Given 07/07/23 0212)  prochlorperazine (COMPAZINE) injection 10 mg (10 mg Intravenous Given 07/07/23 0212)  sodium chloride 0.9 % bolus 1,000 mL (0 mLs Intravenous Stopped 07/07/23 0321)    Initial Impression and Plan  Patient here with exacerbation of chronic pain. Advised we would check for any signs of acute life threatening etiology and work to control her pain here but ultimately this will require outpatient management long term.   ED Course   Clinical Course as of 07/07/23 0357  Sat Jul 07, 2023  0208 CMP with CKD at baseline. Trop is neg. Lipase is normal. Recheck glucose not improved with PO, will give D50 along with pain/nausea meds.  [CS]  0209 I  personally viewed the images from radiology studies and agree with radiologist interpretation: CXR is neg for acute process [CS]  0233 Repeat glucose improved. CBC is unremarkable.  [CS]  0314 Repeat trop remains normal.  [CS]  0345 UA and HCG are neg.  [CS]  0354 CBG has decreased some but she is asymptomatic and able to tolerate PO. She otherwise has not acute findings or indication for admission. Pain is controlled. Recommend she monitor her glucose at home. Follow up with PCP for long term pain control. She is requesting Rx for Motrin, recommend she take that with food. RTED for any other concerns.  [CS]    Clinical Course User Index [CS] Pollyann Savoy, MD     MDM Rules/Calculators/A&P Medical Decision Making Given presenting complaint, I considered that admission might be necessary. After review of results from ED lab and/or imaging studies, admission to the hospital is not indicated at this time.    Problems Addressed: Chronic abdominal pain: chronic illness or injury with exacerbation, progression, or side effects of treatment Chronic chest pain: chronic illness or injury with exacerbation, progression, or side effects of treatment Hypoglycemia: acute illness or injury  Amount and/or Complexity of Data Reviewed Labs: ordered. Decision-making details documented in ED Course. Radiology: ordered and independent interpretation performed. Decision-making details documented in ED Course. ECG/medicine tests: ordered and independent interpretation performed. Decision-making details documented in ED Course.  Risk OTC drugs. Prescription drug management. Decision regarding hospitalization.     Final Clinical Impression(s) / ED Diagnoses Final diagnoses:  Chronic chest pain  Chronic abdominal pain  Hypoglycemia    Rx / DC Orders ED Discharge Orders          Ordered    ibuprofen (ADVIL) 400 MG tablet  Every 8 hours PRN        07/07/23 0356             Pollyann Savoy, MD 07/07/23 [phone]

## 2023-07-07 NOTE — ED Notes (Signed)
Pt given peanut butter crackers and orange juice for blood glucose of 58

## 2023-07-07 NOTE — ED Triage Notes (Signed)
Pt reports CP, HA, leg pain, foot pain.  Pt symptoms began yesterday.  Has taken 1 SLNitro with minimal relief.  Has had some nausea that resolved with zofran.

## 2023-07-09 ENCOUNTER — Other Ambulatory Visit: Payer: Self-pay

## 2023-07-09 ENCOUNTER — Other Ambulatory Visit: Payer: Self-pay | Admitting: Nurse Practitioner

## 2023-07-09 DIAGNOSIS — Z79899 Other long term (current) drug therapy: Secondary | ICD-10-CM | POA: Diagnosis not present

## 2023-07-09 DIAGNOSIS — M545 Low back pain, unspecified: Secondary | ICD-10-CM | POA: Diagnosis not present

## 2023-07-09 MED ORDER — NITROGLYCERIN 0.4 MG SL SUBL
0.4000 mg | SUBLINGUAL_TABLET | SUBLINGUAL | 0 refills | Status: DC | PRN
  Filled 2023-07-09: qty 25, 9d supply, fill #0

## 2023-07-09 NOTE — Telephone Encounter (Signed)
Called pt no answer. KH

## 2023-07-09 NOTE — Telephone Encounter (Signed)
Please call and do not press the nurse line. I will advised front to look out and let me know. KH

## 2023-07-12 ENCOUNTER — Other Ambulatory Visit: Payer: Self-pay

## 2023-07-13 DIAGNOSIS — M899 Disorder of bone, unspecified: Secondary | ICD-10-CM | POA: Insufficient documentation

## 2023-07-13 DIAGNOSIS — Z789 Other specified health status: Secondary | ICD-10-CM | POA: Insufficient documentation

## 2023-07-13 DIAGNOSIS — Z79899 Other long term (current) drug therapy: Secondary | ICD-10-CM | POA: Insufficient documentation

## 2023-07-13 NOTE — Patient Instructions (Signed)
______________________________________________________________________    New Patients  Welcome to [facility].   Initial Visit The first or initial visit consists of an evaluation only.   Interventional pain management.  We offer therapies other than opioid controlled substances to manage chronic pain. These include, but are not limited to, diagnostic, therapeutic, and palliative specialized injection therapies (i.e.: Epidural Steroids, Facet Blocks, etc.). We specialize in a variety of nerve blocks as well as radiofrequency treatments. We offer pain implant evaluations and trials, as well as follow up management. In addition we also provide a variety joint injections, including Viscosupplementation (AKA: Gel Therapy).  Prescription Pain Medication. We specialize in alternatives to opioids. We can provide evaluations and recommendations for/of pharmacologic therapies based on CDC Guidelines.  We no longer take patients for long-term medication management. We will not be taking over your pain medications.  ______________________________________________________________________      ______________________________________________________________________    Patient Information update  To: All of our patients.  Re: Name change.  It has been made official that our current name, "[facility]"   will soon be changed to "[facility]".   The purpose of this change is to eliminate any confusion created by the concept of our practice being a "Medication Management Pain Clinic". In the past this has led to the misconception that we treat pain primarily by the use of prescription medications.  Nothing can be farther from the truth.   Understanding PAIN MANAGEMENT: To further understand what our practice does, you first have to  understand that "Pain Management" is a subspecialty that requires additional training once a physician has completed their specialty training, which can be in either Anesthesia, Neurology, Psychiatry, or Physical Medicine and Rehabilitation (PMR). Each one of these contributes to the final approach taken by each physician to the management of their patient's pain. To be a "Pain Management Specialist" you must have first completed one of the specialty trainings below.  Anesthesiologists - trained in clinical pharmacology and interventional techniques such as nerve blockade and regional as well as central neuroanatomy. They are trained to block pain before, during, and after surgical interventions.  Neurologists - trained in the diagnosis and pharmacological treatment of complex neurological conditions, such as Multiple Sclerosis, Parkinson's, spinal cord injuries, and other systemic conditions that may be associated with symptoms that may include but are not limited to pain. They tend to rely primarily on the treatment of chronic pain using prescription medications.  Psychiatrist - trained in conditions affecting the psychosocial wellbeing of patients including but not limited to depression, anxiety, schizophrenia, personality disorders, addiction, and other substance use disorders that may be associated with chronic pain. They tend to rely primarily on the treatment of chronic pain using prescription medications.   Physical Medicine and Rehabilitation (PMR) physicians, also known as physiatrists - trained to treat a wide variety of medical conditions affecting the brain, spinal cord, nerves, bones, joints, ligaments, muscles, and tendons. Their training is primarily aimed at treating patients that have suffered injuries that have caused severe physical impairment. Their training is primarily aimed at the physical therapy and rehabilitation of those patients. They may also work alongside orthopedic surgeons  or neurosurgeons using their expertise in assisting surgical patients to recover after their surgeries.  INTERVENTIONAL PAIN MANAGEMENT is sub-subspecialty of Pain Management.  Our physicians are Board-certified in Anesthesia, Pain Management, and Interventional Pain Management.  This meaning that  not only have they been trained and Board-certified in their specialty of Anesthesia, and subspecialty of Pain Management, but they have also received further training in the sub-subspecialty of Interventional Pain Management, in order to become Board-certified as INTERVENTIONAL PAIN MANAGEMENT SPECIALIST.    Mission: Our goal is to use our skills in  INTERVENTIONAL PAIN MANAGEMENT as alternatives to the chronic use of prescription opioid medications for the treatment of pain. To make this more clear, we have changed our name to reflect what we do and offer. We will continue to offer medication management assessment and recommendations, but we will not be taking over any patient's medication management.  ______________________________________________________________________      ______________________________________________________________________    Patient information on: Body mass index (BMI) and Weight Management  Dear Jill Shaw you are receiving this information because your weight may be adversely affecting your health.   Your current Estimated body mass index is 45.59 kg/m as calculated from the following:   Height as of 05/18/23: 5\' 4"  (1.626 m).   Weight as of 06/11/23: 265 lb 9.6 oz (120.5 kg).  We recommend you talk to your primary care physician about providing or referring you to a supervised weight management program.  Here is some information about weight and the body mass index (BMI) classification:  BMI is a measure of obesity that's calculated by dividing a person's weight in kilograms by their height in meters squared. A person can use an online calculator to determine their  BMI. Body mass index (BMI) is a common tool for deciding whether a person has an appropriate body weight.  It measures a person's weight in relation to their height.  According to the [facility] of health (NIH): A BMI of less than 18.5 means that a person is underweight. A BMI of between 18.5 and 24.9 is ideal. A BMI of between 25 and 29.9 is overweight. A BMI over 30 indicates obesity.  Body Mass Index (BMI) Classification BMI level (kg/m2) Category Associated incidence of chronic pain  <18  Underweight   18.5-24.9 Ideal body weight   25-29.9 Overweight  20%  30-34.9 Obese (Class I)  68%  35-39.9 Severe obesity (Class II)  136%  >40 Extreme obesity (Class III)  254%    Morbidly Obese Classification: You will be considered to be "Morbidly Obese" if your BMI is above 30 and you have one or more of the following conditions caused or associated to obesity: 1.    Type 2 Diabetes (Leading to cardiovascular diseases (CVD), stroke, peripheral vascular diseases (PVD), retinopathy, nephropathy, and neuropathy) 2.    Cardiovascular Disease (High Blood Pressure; Congestive Heart Failure; High Cholesterol; Coronary Artery Disease; Angina; Arrhythmias, Dysrhythmias, or Heart Attacks) 3.    Breathing problems (Asthma; obesity-hypoventilation syndrome; obstructive sleep apnea; chronic inflammatory airway disease; reactive airway disease; or shortness of breath) 4.    Chronic kidney disease 5.    Liver disease (nonalcoholic fatty liver disease) 6.    High blood pressure 7.    Acid reflux (gastroesophageal reflux disease; heartburn) 8.    Osteoarthritis (OA) (affecting the hip(s), the knee(s) and/or the lower back) (usually requiring knee and/or hip replacements, as well as back surgeries) 9.    Low back pain (Lumbar Facet Syndrome; and/or Degenerative Disc Disease) 10.  Hip pain (Osteoarthritis of hip) (For every 1 lbs of added body weight, there is a 2 lbs increase in pressure inside of each hip  articulation. 1:2 mechanical relationship) 11.  Knee pain (Osteoarthritis of knee) (  For every 1 lbs of added body weight, there is a 4 lbs increase in pressure inside of each knee articulation. 1:4 mechanical relationship) (patients with a BMI>30 kg/m2 were 6.8 times more likely to develop knee OA than normal-weight individuals) 12.  Cancer: Epidemiological studies have shown that obesity is a risk factor for: post-menopausal breast cancer; cancers of the endometrium, colon and kidney cancer; malignant adenomas of the esophagus. Obese subjects have an approximately 1.5-3.5-fold increased risk of developing these cancers compared with normal-weight subjects, and it has been estimated that between 15 and 45% of these cancers can be attributed to overweight. More recent studies suggest that obesity may also increase the risk of other types of cancer, including pancreatic, hepatic and gallbladder cancer. (Ref: Obesity and cancer. Pischon T, Nthlings U, [employer] H. Proc Nutr Soc. 2008 May;67(2):128-45. doi: 10.1017/S0029665108006976.) The International Agency for Research on Cancer (IARC) has identified 13 cancers associated with overweight and obesity: meningioma, multiple myeloma, adenocarcinoma of the esophagus, and cancers of the thyroid, postmenopausal breast cancer, gallbladder, stomach, liver, pancreas, kidney, ovaries, uterus, colon and rectal (colorectal) cancers. 55 percent of all cancers diagnosed in women and 24 percent of those diagnosed in men are associated with overweight and obesity.  Recommendation: If you have any of the above conditions it is urgent that you take a step back and concentrate in losing weight. Dedicate 100% of your efforts on this task. Nothing else will improve your health more than bringing your weight down and your BMI to less than 30.   Nutritionist and/or supervised weight-management program: We are aware that most chronic pain patients are unable to exercise secondary to  their pain. For this reason, you must rely on proper nutrition and diet in order to lose the weight. We recommend you talk to a nutritionist.   Bariatric surgery: A person might be considered a candidate for bariatric surgery if they meet one of the following BMI criteria:  BMI of 40 or higher: This is considered extreme obesity (Class III). BMI of 35-39.9: This is considered obesity, and the person might also have a serious weight-related health condition, such as high blood pressure, type 2 diabetes, or severe sleep apnea  BMI of 30-34.9: This might be considered if the person has serious weight-related health problems and hasn't had substantial weight loss or improvement in co-morbidities through other methods   On your own: A realistic goal is to lose 10% of your body weight over a period of 12 months.  If over a period of six (6) months you have unsuccessfully tried to lose weight, then it is time for you to seek professional help and to enter a medically supervised weight management program, and/or undergo bariatric surgery.   Pain management considerations and possible limitations:  1.    Pharmacological Problems: Be advised that the use of opioid analgesics (oxycodone; hydrocodone; morphine; methadone; codeine; and all of their derivatives) have been associated with decreased metabolism and weight gain.  For this reason, should we see that you are unable to lose weight while taking these medications, it may become necessary for [place] to taper down and indefinitely discontinue them.  2.    Technical Problems: The incidence of successful interventional therapies decreases as the patient's BMI increases. It is much more difficult to accomplish a safe and effective interventional therapy on a patient with a BMI above 35. 3.    Radiation Exposure Problems: The x-rays machine, used to accomplish injection therapies, will automatically increase their x-ray output in  order to capture an appropriate bone  image. This means that radiation exposure increases exponentially with the patient's BMI. (The higher the BMI, the higher the radiation exposure.) Although the level of radiation used at a given time is still safe to the patient, it is not for the physician and/or assisting staff. Unfortunately, radiation exposure is accumulative. Because physicians and the staff have to do procedures and be exposed on a daily basis, this can result in health problems such as cancer and radiation burns. Radiation exposure to the staff is monitored by the radiation batches that they wear. The exposure levels are reported back to the staff on a quarterly basis. Depending on levels of exposure, physicians and staff may be obligated by law to decrease this exposure. This means that they have the right and obligation to refuse providing therapies where they may be overexposed to radiation. For this reason, physicians may decline to offer therapies such as radiofrequency ablation or implants to patients with a BMI above 40. 4.    Current Trends: Be advised that the current trend is to no longer offer certain therapies to patients with a BMI equal to, or above 35, due to increase perioperative risks, increased technical procedural difficulties, and excessive radiation exposure to healthcare personnel.  Last updated: 07/09/2023 ______________________________________________________________________

## 2023-07-13 NOTE — Progress Notes (Unsigned)
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    Insulin dependent type 2 diabetes mellitus (HCC)    MDD (major depressive disorder)    Moderate COPD (chronic obstructive pulmonary disease) (HCC)    Moderate persistent asthma    Wears glasses    Constitutional Exam  General appearance: Well nourished, well developed, and well hydrated. In no apparent acute distress There were no vitals filed for this visit. BMI Assessment: Estimated body mass index is 45.59 kg/m as calculated from the following:   Height as of 05/18/23: 5\' 4"  (1.626 m).   Weight as of 06/11/23: 265 lb 9.6 oz (120.5 kg).  BMI interpretation table: BMI level Category Range association with higher incidence of chronic pain  <18 kg/m2 Underweight   18.5-24.9 kg/m2 Ideal body weight   25-29.9 kg/m2 Overweight Increased incidence by 20%   30-34.9 kg/m2 Obese (Class I) Increased incidence by 68%  35-39.9 kg/m2 Severe obesity (Class II) Increased incidence by 136%  >40 kg/m2 Extreme obesity (Class III) Increased incidence by 254%   Patient's current BMI Ideal Body weight  There is no height or weight on file to calculate BMI. Patient weight not recorded   BMI Readings from Last 4 Encounters:  06/11/23 45.59 kg/m  05/18/23 44.11 kg/m  05/15/23 45.98 kg/m  05/12/23 45.98 kg/m  Wt Readings from Last 4 Encounters:  06/11/23 265 lb 9.6 oz (120.5 kg)  05/18/23 257 lb (116.6 kg)  05/15/23 267 lb 13.7 oz (121.5 kg)  05/12/23 267 lb 13.7 oz (121.5 kg)    Psych/Mental status: Alert, oriented x 3 (person, place, & time)       Eyes: PERLA Respiratory: No evidence of acute respiratory distress  Assessment  Primary Diagnosis & Pertinent Problem List: The primary encounter diagnosis was Chronic pain syndrome. Diagnoses of Pharmacologic therapy, Disorder of skeletal system, and Problems influencing health status were also pertinent to this visit.  Visit Diagnosis (New problems to examiner): 1. Chronic pain syndrome   2. Pharmacologic therapy   3. Disorder of skeletal system   4. Problems influencing health status    Plan of Care (Initial workup plan)  Note: Ms. Rohrer was reminded that as per protocol, today's visit has been an evaluation only. We have not taken over the patient's controlled substance management.  Problem-specific plan: No problem-specific Assessment & Plan notes found for this encounter.  Lab Orders  No laboratory test(s) ordered today   Imaging Orders  No imaging studies ordered today   Referral Orders  No referral(s) requested today   Procedure Orders    No procedure(s) ordered today   Pharmacotherapy (current): Medications ordered:  No orders of the defined types were placed in this encounter.  Medications administered during this visit: [name] had no medications  administered during this visit.   Analgesic Pharmacotherapy:  Opioid Analgesics: For patients currently taking or requesting to take opioid analgesics, in accordance with [facility] Guidelines, we will assess their risks and indications for the use of these substances. After completing our evaluation, we may offer recommendations, but we no longer take patients for medication management. The prescribing physician will ultimately decide, based on his/her training and level of comfort whether to adopt any of the recommendations, including whether or not to prescribe such medicines.  Membrane stabilizer: To be determined at a later time  Muscle relaxant: To be determined at a later time  NSAID: To be determined at a later time  Other analgesic(s): To be determined at a later time   Interventional management options: Ms. Tantillo was informed that there is no guarantee that she would be a candidate for interventional therapies. The decision will be based on the results of diagnostic studies, as well as Ms. Onstad risk profile.  Procedure(s) under consideration:  Pending results of ordered studies      Interventional Therapies  Risk Factors  Considerations  Medical Comorbidities:  MO (BMI>40)  T2IDDM  Hx. DKA  CKD  COPD  GERD  CHF  HTN  Smoker  Gastroparesis  Hx. ETOH use  Hx. Overdose (Elavil)  Prolonged QT  Hx. Serotonin Syndrome  Sickle Cell Trait     Planned  Pending:      Under consideration:   Pending   Completed:   None at this time   Therapeutic  Palliative (PRN) options:   None established   Completed by other providers:   None reported      Provider-requested follow-up: No follow-ups on file.  Future Appointments  Date Time Provider Department Center  07/18/2023  8:00 AM Delano Metz, MD [facility] None  07/19/2023 11:00 AM Zehr, Princella Pellegrini, PA-C [facility]  07/26/2023 11:15 AM Carlis Abbott, Drema Pry, MD [facility] CPR   09/10/2023  3:00 PM Ivonne Andrew, NP [facility] None    Duration of encounter: *** minutes.  Total time on encounter, as per AMA guidelines included both the face-to-face and non-face-to-face time personally spent by the physician and/or other qualified health care professional(s) on the day of the encounter (includes time in activities that require the physician or other qualified health care professional and does not include time in activities normally performed by clinical staff). Physician's time may include the following activities when performed: Preparing to see the patient (e.g., pre-charting review of records, searching for previously ordered imaging, lab work, and nerve conduction tests) Review of prior analgesic pharmacotherapies. Reviewing PMP Interpreting ordered tests (e.g., lab work, imaging, nerve conduction tests) Performing post-procedure evaluations, including interpretation of diagnostic procedures Obtaining and/or reviewing separately obtained history Performing a medically appropriate examination and/or evaluation Counseling and educating the patient/family/caregiver Ordering medications, tests, or procedures Referring and communicating with other health care professionals (when not separately reported) Documenting clinical information in the electronic or other health record Independently interpreting results (not separately reported) and communicating results to the patient/ family/caregiver Care coordination (not separately reported)  Note by: Oswaldo Done, MD (TTS technology used. I apologize for any typographical errors that were not detected and corrected.) Date: 07/18/2023; Time: 11:00 AM

## 2023-07-17 ENCOUNTER — Encounter (HOSPITAL_BASED_OUTPATIENT_CLINIC_OR_DEPARTMENT_OTHER): Payer: Self-pay | Admitting: Emergency Medicine

## 2023-07-17 ENCOUNTER — Emergency Department (HOSPITAL_BASED_OUTPATIENT_CLINIC_OR_DEPARTMENT_OTHER)
Admission: EM | Admit: 2023-07-17 | Discharge: 2023-07-18 | Disposition: A | Discharge status: Home / Self Care | Payer: Medicare Other | Attending: Emergency Medicine | Admitting: Emergency Medicine

## 2023-07-17 DIAGNOSIS — R1084 Generalized abdominal pain: Secondary | ICD-10-CM | POA: Insufficient documentation

## 2023-07-17 DIAGNOSIS — N189 Chronic kidney disease, unspecified: Secondary | ICD-10-CM | POA: Insufficient documentation

## 2023-07-17 DIAGNOSIS — Z79899 Other long term (current) drug therapy: Secondary | ICD-10-CM | POA: Insufficient documentation

## 2023-07-17 DIAGNOSIS — R112 Nausea with vomiting, unspecified: Secondary | ICD-10-CM | POA: Diagnosis present

## 2023-07-17 DIAGNOSIS — I130 Hypertensive heart and chronic kidney disease with heart failure and stage 1 through stage 4 chronic kidney disease, or unspecified chronic kidney disease: Secondary | ICD-10-CM | POA: Insufficient documentation

## 2023-07-17 DIAGNOSIS — E1143 Type 2 diabetes mellitus with diabetic autonomic (poly)neuropathy: Secondary | ICD-10-CM | POA: Insufficient documentation

## 2023-07-17 DIAGNOSIS — R109 Unspecified abdominal pain: Secondary | ICD-10-CM | POA: Diagnosis not present

## 2023-07-17 DIAGNOSIS — Z20822 Contact with and (suspected) exposure to covid-19: Secondary | ICD-10-CM | POA: Insufficient documentation

## 2023-07-17 DIAGNOSIS — R519 Headache, unspecified: Secondary | ICD-10-CM | POA: Insufficient documentation

## 2023-07-17 DIAGNOSIS — I5030 Unspecified diastolic (congestive) heart failure: Secondary | ICD-10-CM | POA: Diagnosis not present

## 2023-07-17 DIAGNOSIS — M549 Dorsalgia, unspecified: Secondary | ICD-10-CM | POA: Diagnosis not present

## 2023-07-17 DIAGNOSIS — Z1152 Encounter for screening for COVID-19: Secondary | ICD-10-CM | POA: Insufficient documentation

## 2023-07-17 DIAGNOSIS — N289 Disorder of kidney and ureter, unspecified: Secondary | ICD-10-CM

## 2023-07-17 DIAGNOSIS — G8929 Other chronic pain: Secondary | ICD-10-CM | POA: Insufficient documentation

## 2023-07-17 DIAGNOSIS — K3184 Gastroparesis: Secondary | ICD-10-CM | POA: Insufficient documentation

## 2023-07-17 DIAGNOSIS — Z794 Long term (current) use of insulin: Secondary | ICD-10-CM | POA: Diagnosis not present

## 2023-07-17 DIAGNOSIS — I10 Essential (primary) hypertension: Secondary | ICD-10-CM

## 2023-07-17 LAB — COMPREHENSIVE METABOLIC PANEL
ALT: 5 U/L (ref 0–44)
AST: 11 U/L — ABNORMAL LOW (ref 15–41)
Albumin: 4.4 g/dL (ref 3.5–5.0)
Alkaline Phosphatase: 69 U/L (ref 38–126)
Anion gap: 12 (ref 5–15)
BUN: 11 mg/dL (ref 6–20)
CO2: 21 mmol/L — ABNORMAL LOW (ref 22–32)
Calcium: 9.4 mg/dL (ref 8.9–10.3)
Chloride: 108 mmol/L (ref 98–111)
Creatinine, Ser: 1.18 mg/dL — ABNORMAL HIGH (ref 0.44–1.00)
GFR, Estimated: 58 mL/min — ABNORMAL LOW (ref >60)
Glucose, Bld: 119 mg/dL — ABNORMAL HIGH (ref 70–99)
Potassium: 3.5 mmol/L (ref 3.5–5.1)
Sodium: 141 mmol/L (ref 135–145)
Total Bilirubin: 0.5 mg/dL (ref 0.3–1.2)
Total Protein: 7.3 g/dL (ref 6.5–8.1)

## 2023-07-17 LAB — RESP PANEL BY RT-PCR (RSV, FLU A&B, COVID)  RVPGX2
Influenza A by PCR: NEGATIVE
Influenza B by PCR: NEGATIVE
Resp Syncytial Virus by PCR: NEGATIVE
SARS Coronavirus 2 by RT PCR: NEGATIVE

## 2023-07-17 LAB — CBC
HCT: 38.8 % (ref 36.0–46.0)
Hemoglobin: 13.2 g/dL (ref 12.0–15.0)
MCH: 26.9 pg (ref 26.0–34.0)
MCHC: 34 g/dL (ref 30.0–36.0)
MCV: 79 fL — ABNORMAL LOW (ref 80.0–100.0)
Platelets: 228 10*3/uL (ref 150–400)
RBC: 4.91 MIL/uL (ref 3.87–5.11)
RDW: 17.2 % — ABNORMAL HIGH (ref 11.5–15.5)
WBC: 9.5 10*3/uL (ref 4.0–10.5)
nRBC: 0 % (ref 0.0–0.2)

## 2023-07-17 LAB — URINALYSIS, ROUTINE W REFLEX MICROSCOPIC
Bilirubin Urine: NEGATIVE
Glucose, UA: NEGATIVE mg/dL
Hgb urine dipstick: NEGATIVE
Ketones, ur: NEGATIVE mg/dL
Leukocytes,Ua: NEGATIVE
Nitrite: NEGATIVE
Specific Gravity, Urine: 1.026 (ref 1.005–1.030)
pH: 5.5 (ref 5.0–8.0)

## 2023-07-17 LAB — PREGNANCY, URINE: Preg Test, Ur: NEGATIVE

## 2023-07-17 LAB — LIPASE, BLOOD: Lipase: 10 U/L — ABNORMAL LOW (ref 11–51)

## 2023-07-17 NOTE — ED Triage Notes (Signed)
ABD PAIn , vomiting started today Lower back pain x 3 days Leg pain Feels ill.

## 2023-07-18 ENCOUNTER — Ambulatory Visit: Payer: Medicare Other | Attending: Pain Medicine | Admitting: Pain Medicine

## 2023-07-18 ENCOUNTER — Encounter: Payer: Self-pay | Admitting: Pain Medicine

## 2023-07-18 VITALS — BP 213/117 | HR 87 | Temp 97.9°F | Ht 64.0 in | Wt 265.0 lb

## 2023-07-18 DIAGNOSIS — G5603 Carpal tunnel syndrome, bilateral upper limbs: Secondary | ICD-10-CM

## 2023-07-18 DIAGNOSIS — Z9189 Other specified personal risk factors, not elsewhere classified: Secondary | ICD-10-CM

## 2023-07-18 DIAGNOSIS — M79604 Pain in right leg: Secondary | ICD-10-CM | POA: Diagnosis not present

## 2023-07-18 DIAGNOSIS — R7989 Other specified abnormal findings of blood chemistry: Secondary | ICD-10-CM

## 2023-07-18 DIAGNOSIS — M25562 Pain in left knee: Secondary | ICD-10-CM

## 2023-07-18 DIAGNOSIS — M25561 Pain in right knee: Secondary | ICD-10-CM | POA: Diagnosis not present

## 2023-07-18 DIAGNOSIS — M5441 Lumbago with sciatica, right side: Secondary | ICD-10-CM | POA: Diagnosis not present

## 2023-07-18 DIAGNOSIS — Z79899 Other long term (current) drug therapy: Secondary | ICD-10-CM

## 2023-07-18 DIAGNOSIS — Z789 Other specified health status: Secondary | ICD-10-CM

## 2023-07-18 DIAGNOSIS — G5610 Other lesions of median nerve, unspecified upper limb: Secondary | ICD-10-CM

## 2023-07-18 DIAGNOSIS — R109 Unspecified abdominal pain: Secondary | ICD-10-CM

## 2023-07-18 DIAGNOSIS — E559 Vitamin D deficiency, unspecified: Secondary | ICD-10-CM | POA: Diagnosis present

## 2023-07-18 DIAGNOSIS — S32010S Wedge compression fracture of first lumbar vertebra, sequela: Secondary | ICD-10-CM

## 2023-07-18 DIAGNOSIS — T8332XA Displacement of intrauterine contraceptive device, initial encounter: Secondary | ICD-10-CM | POA: Insufficient documentation

## 2023-07-18 DIAGNOSIS — S22080A Wedge compression fracture of T11-T12 vertebra, initial encounter for closed fracture: Secondary | ICD-10-CM | POA: Insufficient documentation

## 2023-07-18 DIAGNOSIS — R700 Elevated erythrocyte sedimentation rate: Secondary | ICD-10-CM

## 2023-07-18 DIAGNOSIS — G5601 Carpal tunnel syndrome, right upper limb: Secondary | ICD-10-CM

## 2023-07-18 DIAGNOSIS — D1779 Benign lipomatous neoplasm of other sites: Secondary | ICD-10-CM | POA: Insufficient documentation

## 2023-07-18 DIAGNOSIS — M79672 Pain in left foot: Secondary | ICD-10-CM | POA: Insufficient documentation

## 2023-07-18 DIAGNOSIS — I130 Hypertensive heart and chronic kidney disease with heart failure and stage 1 through stage 4 chronic kidney disease, or unspecified chronic kidney disease: Secondary | ICD-10-CM | POA: Diagnosis not present

## 2023-07-18 DIAGNOSIS — E1142 Type 2 diabetes mellitus with diabetic polyneuropathy: Secondary | ICD-10-CM

## 2023-07-18 DIAGNOSIS — G8929 Other chronic pain: Secondary | ICD-10-CM | POA: Diagnosis not present

## 2023-07-18 DIAGNOSIS — G5602 Carpal tunnel syndrome, left upper limb: Secondary | ICD-10-CM

## 2023-07-18 DIAGNOSIS — M79605 Pain in left leg: Secondary | ICD-10-CM | POA: Diagnosis not present

## 2023-07-18 DIAGNOSIS — R7982 Elevated C-reactive protein (CRP): Secondary | ICD-10-CM

## 2023-07-18 DIAGNOSIS — S22080S Wedge compression fracture of T11-T12 vertebra, sequela: Secondary | ICD-10-CM

## 2023-07-18 DIAGNOSIS — M79671 Pain in right foot: Secondary | ICD-10-CM | POA: Diagnosis not present

## 2023-07-18 DIAGNOSIS — M899 Disorder of bone, unspecified: Secondary | ICD-10-CM

## 2023-07-18 DIAGNOSIS — M5442 Lumbago with sciatica, left side: Secondary | ICD-10-CM

## 2023-07-18 DIAGNOSIS — G894 Chronic pain syndrome: Secondary | ICD-10-CM

## 2023-07-18 DIAGNOSIS — E882 Lipomatosis, not elsewhere classified: Secondary | ICD-10-CM | POA: Insufficient documentation

## 2023-07-18 DIAGNOSIS — T8332XS Displacement of intrauterine contraceptive device, sequela: Secondary | ICD-10-CM

## 2023-07-18 LAB — MAGNESIUM: Magnesium: 1.6 mg/dL — ABNORMAL LOW (ref 1.7–2.4)

## 2023-07-18 MED ORDER — SODIUM CHLORIDE 0.9 % IV BOLUS
1000.0000 mL | Freq: Once | INTRAVENOUS | Status: AC
  Administered 2023-07-18: 1000 mL via INTRAVENOUS

## 2023-07-18 MED ORDER — HYDROMORPHONE HCL 1 MG/ML IJ SOLN
1.0000 mg | Freq: Once | INTRAMUSCULAR | Status: AC
  Administered 2023-07-18: 1 mg via INTRAVENOUS
  Filled 2023-07-18: qty 1

## 2023-07-18 MED ORDER — ONDANSETRON HCL 4 MG/2ML IJ SOLN
4.0000 mg | Freq: Once | INTRAMUSCULAR | Status: AC
  Administered 2023-07-18: 4 mg via INTRAVENOUS
  Filled 2023-07-18: qty 2

## 2023-07-18 NOTE — ED Notes (Addendum)
Pt dry heaving in room, attempted to order standing orders for nausea/vomiting but pt has hx of prolonged QT intervals so unable to order at this time.

## 2023-07-18 NOTE — ED Provider Notes (Signed)
EMERGENCY DEPARTMENT AT [facility] Provider Note   CSN: [id number] Arrival date & time: 07/17/23  2112     History  Chief Complaint  Patient presents with   Abdominal Pain   Back Pain   Headache    Jill Shaw is a 47 y.o. female.  The history is provided by the patient.  Abdominal Pain Back Pain Associated symptoms: abdominal pain and headaches   Headache Associated symptoms: abdominal pain and back pain   She has history of diabetes complicated by gastroparesis, diastolic heart failure, multiple chronic pain syndromes, chronic kidney disease and comes in complaining of back pain, abdominal pain, headache for the last 3 days.  Headache is new for her but all other pain is similar to what she has had in the past.  She is also complaining of nausea and vomiting.  She denies fever or chills.  She is concerned that her blood pressure has been running high at home.   Home Medications Prior to Admission medications   Medication Sig Start Date End Date Taking? Authorizing Provider  Accu-Chek Softclix Lancets lancets USE TO TEST AS DIRECTED UP TO FOUR TIMES DAILY. 03/06/22   Shamleffer, Konrad Dolores, MD  albuterol (VENTOLIN HFA) 108 (90 Base) MCG/ACT inhaler Inhale 2 puffs into the lungs every 6 (six) hours as needed for wheezing or shortness of breath. 07/18/21   Luciano Cutter, MD  B-D ULTRAFINE III SHORT PEN 31G X 8 MM MISC USE AS DIRECTED EVERY MORNING, NOON, EVENING, AND AT BEDTIME 07/10/22   Shamleffer, Konrad Dolores, MD  Continuous Blood Gluc Sensor (FREESTYLE LIBRE 3 SENSOR) MISC Place 1 sensor on the skin every 14 days. Use to check glucose continuously 07/06/22   Ivonne Andrew, NP  Dexlansoprazole 30 MG capsule DR TAKE 1 CAPSULE (30 MG TOTAL) BY MOUTH DAILY. NEEDS OFFICE VISIT FOR ADDITIONAL REFILLS 03/26/23   Ivonne Andrew, NP  dicyclomine (BENTYL) 20 MG tablet Take 1 tablet (20 mg total) by mouth 2 (two) times daily. 06/05/23   Ivonne Andrew, NP  furosemide (LASIX) 40 MG tablet TAKE 1 TABLET(40 MG) BY MOUTH TWICE DAILY Patient taking differently: Take 40 mg by mouth 2 (two) times daily. 01/19/23   Patwardhan, Manish J, MD  glucose blood (ACCU-CHEK GUIDE) test strip USE TO TEST BLOOD GLUCOSE AS DIRECTED UP TO 4 TIMES DAILY 11/14/22   Ivonne Andrew, NP  HUMALOG KWIKPEN 200 UNIT/ML KwikPen Inject 50 Units into the skin 3 (three) times daily before meals. 06/05/23   Ivonne Andrew, NP  hydrochlorothiazide (MICROZIDE) 12.5 MG capsule TAKE 1 CAPSULE(12.5 MG) BY MOUTH DAILY Patient taking differently: Take 12.5 mg by mouth daily. 12/14/21   Cantwell, Celeste C, PA-C  ibuprofen (ADVIL) 400 MG tablet Take 1 tablet (400 mg total) by mouth every 8 (eight) hours as needed for moderate pain. 07/07/23   Pollyann Savoy, MD  losartan (COZAAR) 100 MG tablet TAKE 1 TABLET BY MOUTH EVERY DAY 04/30/23   Ivonne Andrew, NP  metoCLOPramide (REGLAN) 10 MG tablet Take 1 tablet (10 mg total) by mouth every 6 (six) hours as needed for nausea. 01/17/23   Al Decant, PA-C  metoCLOPramide (REGLAN) 10 MG tablet Take 1 tablet (10 mg total) by mouth every 8 (eight) hours as needed for nausea. 05/12/23   Tilden Fossa, MD  metoprolol succinate (TOPROL-XL) 100 MG 24 hr tablet Take 100 mg by mouth daily. Take with or immediately following a meal.  [provider]  nicotine (NICODERM CQ - DOSED IN MG/24 HOURS) 21 mg/24hr patch Place 1 patch (21 mg total) onto the skin daily. 07/04/23 07/03/24  Ivonne Andrew, NP  nitroGLYCERIN (NITROSTAT) 0.4 MG SL tablet Place 1 tablet (0.4 mg total) under the tongue every 5 (five) minutes as needed for chest pain. Additional refills to be filled by PCP, patient aware 07/09/23   Ivonne Andrew, NP  norethindrone (AYGESTIN) 5 MG tablet Take 1 tablet (5 mg total) by mouth 3 (three) times daily. 05/22/23   Lorriane Shire, MD  ondansetron (ZOFRAN) 4 MG tablet Take 1 tablet (4 mg total) by mouth every 8 (eight) hours  as needed for nausea or vomiting. 06/06/23   Ivonne Andrew, NP  Pimozide 1 MG TABS Take by mouth. 02/10/23   [provider]  potassium chloride SA (KLOR-CON M) 20 MEQ tablet Take 1 tablet (20 mEq total) by mouth daily. 03/19/23   Ivonne Andrew, NP  rosuvastatin (CRESTOR) 20 MG tablet TAKE 1 TABLET(20 MG) BY MOUTH DAILY 01/19/23   Patwardhan, Anabel Bene, MD  Simethicone 180 MG CAPS Take 1 capsule (180 mg total) by mouth 3 (three) times daily as needed. 05/12/23   Tilden Fossa, MD  SPIRIVA RESPIMAT 2.5 MCG/ACT AERS INHALE 2 PUFFS INTO THE LUNGS DAILY 02/03/22   Icard, Rachel Bo, DO  spironolactone (ALDACTONE) 50 MG tablet TAKE 1 TABLET(50 MG) BY MOUTH DAILY 12/22/20   Patwardhan, Anabel Bene, MD  SYMBICORT 160-4.5 MCG/ACT inhaler INHALE 2 PUFFS INTO THE LUNGS TWICE DAILY 10/31/22   Icard, Rachel Bo, DO  tiZANidine (ZANAFLEX) 4 MG tablet Take 1 tablet (4 mg total) by mouth every 8 (eight) hours as needed for muscle spasms. 06/22/23   Ivonne Andrew, NP  tizanidine (ZANAFLEX) 6 MG capsule TAKE 1 CAPSULE BY MOUTH THREE TIMES A DAY 07/03/23   Raulkar, Drema Pry, MD  TOUJEO MAX SOLOSTAR 300 UNIT/ML Solostar Pen Inject 60 Units into the skin in the morning. 06/19/23   Ivonne Andrew, NP      Allergies    Elavil [amitriptyline], Orudis [ketoprofen], Desyrel [trazodone], Tylenol [acetaminophen], Aspirin, Motrin [ibuprofen], Naprosyn [naproxen], Neurontin [gabapentin], Sulfa antibiotics, Ultram [tramadol], Victoza [liraglutide], and Zegerid [omeprazole-sodium bicarbonate]    Review of Systems   Review of Systems  Gastrointestinal:  Positive for abdominal pain.  Musculoskeletal:  Positive for back pain.  Neurological:  Positive for headaches.  All other systems reviewed and are negative.   Physical Exam Updated Vital Signs BP (!) 165/93 (BP Location: Right Arm)   Pulse 86   Temp 97.7 F (36.5 C)   Resp 20   SpO2 100%  Physical Exam Vitals and nursing note reviewed.   47 year old female,  appears uncomfortable, but is in no acute distress. Vital signs are significant for elevated blood pressure. Oxygen saturation is 100%, which is normal. Head is normocephalic and atraumatic. PERRLA, EOMI. Oropharynx is clear.  Fundi show presence of exudate but no hemorrhage and no papilledema. Neck is nontender and supple without adenopathy or JVD. Back is nontender and there is no CVA tenderness. Lungs are clear without rales, wheezes, or rhonchi. Chest is nontender. Heart has regular rate and rhythm without murmur. Abdomen is soft, flat, with moderate tenderness diffusely. Extremities have no cyanosis or edema. Skin is warm and dry without rash. Neurologic: Awake and alert, moves all extremities equally.  ED Results / Procedures / Treatments   Labs (all labs ordered are listed, but only abnormal results  are displayed) Labs Reviewed  LIPASE, BLOOD - Abnormal; Notable for the following components:      Result Value   Lipase 10 (*)    All other components within normal limits  COMPREHENSIVE METABOLIC PANEL - Abnormal; Notable for the following components:   CO2 21 (*)    Glucose, Bld 119 (*)    Creatinine, Ser 1.18 (*)    AST 11 (*)    GFR, Estimated 58 (*)    All other components within normal limits  CBC - Abnormal; Notable for the following components:   MCV 79.0 (*)    RDW 17.2 (*)    All other components within normal limits  URINALYSIS, ROUTINE W REFLEX MICROSCOPIC - Abnormal; Notable for the following components:   Protein, ur TRACE (*)    All other components within normal limits  MAGNESIUM - Abnormal; Notable for the following components:   Magnesium 1.6 (*)    All other components within normal limits  RESP PANEL BY RT-PCR (RSV, FLU A&B, COVID)  RVPGX2  PREGNANCY, URINE    EKG DESTINEE, TABER ZO:[id number] 17-Jul-2023 98:11:91 [facility] ROUTINE RECORD 07/22/1976 (47 yr) Female Black Room:DB010 Loc:404 Technician: [id number] Test FAO:ZHYQM  Pain Vent. rate 90 BPM PR interval 156 ms QRS duration 82 ms QT/QTcB 374/457 ms P-R-T axes 67 -13 9 Normal sinus rhythm Normal ECG When compared with ECG of 07-Jul-2023 00:33, Nonspecific T wave abnormality no longer evident in Lateral leads Confirmed by Ernie Avena ([id number]) on 07/17/2023 11:20:20 PM Confirmed By: Ernie Avena  Procedures Procedures    Medications Ordered in ED Medications  sodium chloride 0.9 % bolus 1,000 mL (0 mLs Intravenous Stopped 07/18/23 0408)  HYDROmorphone (DILAUDID) injection 1 mg (1 mg Intravenous Given 07/18/23 0137)  ondansetron (ZOFRAN) injection 4 mg (4 mg Intravenous Given 07/18/23 0137)  HYDROmorphone (DILAUDID) injection 1 mg (1 mg Intravenous Given 07/18/23 0338)  ondansetron (ZOFRAN) injection 4 mg (4 mg Intravenous Given 07/18/23 5784)    ED Course/ Medical Decision Making/ A&P                                 Medical Decision Making Amount and/or Complexity of Data Reviewed Labs: ordered.  Risk Prescription drug management.   Abdominal pain in a patient with known history of chronic abdominal pain and gastroparesis with no concerning findings on exam.  Back pain which is also part of chronic pain syndrome and likely related to her abdominal pain.  Headache without any concerning red flags, doubt hypertensive emergency.  I have reviewed her past records, and she has multiple ED visits for pain syndromes and has had multiple CT scans of her abdomen and pelvis including on 5 this year.  I do not see any indications for advanced imaging today.  I have reviewed her laboratory tests, and my interpretation is stable mild renal insufficiency, normal CBC, negative pregnancy test, urinalysis unremarkable, negative PCR test for influenza RSV and COVID-19.  I have ordered IV fluids, ondansetron for nausea, hydromorphone for pain.  She is noted to have a history of prolonged QT interval, so I have ordered an electrocardiogram.  I have reviewed her  electrocardiogram and my interpretation is normal QT interval, normal ECG.  Following above-noted treatment, patient noted slight improvement but requested additional doses of both pain medicine and nausea medicine.  I ordered additional doses of ondansetron and hydromorphone.  Following this, she was feeling much better and  I discharged her with instructions to follow-up with her primary care provider.  She also states that she is scheduled to see her pain management doctor today.  Final Clinical Impression(s) / ED Diagnoses Final diagnoses:  Generalized abdominal pain  Nausea and vomiting, unspecified vomiting type  Elevated blood pressure reading with diagnosis of hypertension  Renal insufficiency    Rx / DC Orders ED Discharge Orders     None         Dione Booze, MD 07/18/23 [phone]

## 2023-07-18 NOTE — Progress Notes (Signed)
Safety precautions to be maintained throughout the outpatient stay will include: orient to surroundings, keep bed in low position, maintain call bell within reach at all times, provide assistance with transfer out of bed and ambulation.    The patient check in at 08:58am with her mother at her side. I welcome them both to the pain clinic and introduced myself. During the admission, the pt stated that she was unable to tolerate steroid injections. The only thing that helps her pain is oxycodone. I explained to her that Dr. Shauna Hugh is not prescribing pain medication at this time. Her mother got very upset and stated that she rather hear this from Dr. Laban Emperor.  I continued with her admission and asked the pt why she left the other pain clinic. She stated that the pain clinic stopped her oxycodone because the pharmacy was filling her oxcodone too early. Dr. Shauna Hugh entered the room and introduce himself. He informed the pt that he wasn't prescribing pain meds for new patients. Both the patient and mother got very upset and started yelling at Dr. Shauna Hugh. He remained calm the during his conversation with the pt and her mother. They both left the clinic without being discharged.

## 2023-07-18 NOTE — ED Notes (Addendum)
Pt amb to the bathroom independently. Req more pain and nausea meds, sts that normally she gets two doses, md notified.

## 2023-07-19 ENCOUNTER — Encounter: Payer: Self-pay | Admitting: Gastroenterology

## 2023-07-19 ENCOUNTER — Other Ambulatory Visit (HOSPITAL_COMMUNITY): Payer: Self-pay

## 2023-07-19 ENCOUNTER — Other Ambulatory Visit: Payer: Self-pay | Admitting: Nurse Practitioner

## 2023-07-19 ENCOUNTER — Ambulatory Visit: Payer: Medicare Other | Admitting: Gastroenterology

## 2023-07-19 ENCOUNTER — Other Ambulatory Visit: Payer: Self-pay | Admitting: Obstetrics and Gynecology

## 2023-07-19 ENCOUNTER — Other Ambulatory Visit: Payer: Self-pay

## 2023-07-19 VITALS — BP 180/80 | HR 100 | Ht 64.0 in | Wt 270.4 lb

## 2023-07-19 DIAGNOSIS — R112 Nausea with vomiting, unspecified: Secondary | ICD-10-CM | POA: Diagnosis not present

## 2023-07-19 DIAGNOSIS — R197 Diarrhea, unspecified: Secondary | ICD-10-CM | POA: Diagnosis not present

## 2023-07-19 DIAGNOSIS — N939 Abnormal uterine and vaginal bleeding, unspecified: Secondary | ICD-10-CM

## 2023-07-19 DIAGNOSIS — R109 Unspecified abdominal pain: Secondary | ICD-10-CM | POA: Diagnosis not present

## 2023-07-19 MED ORDER — NORETHINDRONE ACETATE 5 MG PO TABS
5.0000 mg | ORAL_TABLET | Freq: Three times a day (TID) | ORAL | 0 refills | Status: DC
  Filled 2023-07-19: qty 90, 30d supply, fill #0

## 2023-07-19 MED ORDER — TOUJEO MAX SOLOSTAR 300 UNIT/ML ~~LOC~~ SOPN: 60.0000 [IU] | PEN_INJECTOR | Freq: Every morning | SUBCUTANEOUS | 2 refills | Status: DC

## 2023-07-19 MED ORDER — TOUJEO MAX SOLOSTAR 300 UNIT/ML ~~LOC~~ SOPN
60.0000 [IU] | PEN_INJECTOR | Freq: Every morning | SUBCUTANEOUS | 2 refills | Status: DC
  Filled 2023-07-19: qty 9, 45d supply, fill #0

## 2023-07-19 MED ORDER — SUCRALFATE 1 G PO TABS
1.0000 g | ORAL_TABLET | Freq: Three times a day (TID) | ORAL | 0 refills | Status: DC
  Filled 2023-07-19: qty 120, 30d supply, fill #0

## 2023-07-19 MED ORDER — NA SULFATE-K SULFATE-MG SULF 17.5-3.13-1.6 GM/177ML PO SOLN
1.0000 | Freq: Once | ORAL | 0 refills | Status: AC
  Filled 2023-07-19: qty 354, 2d supply, fill #0

## 2023-07-19 NOTE — Telephone Encounter (Signed)
Pt has been called still no answer . LVM for pt to call office back. KH

## 2023-07-19 NOTE — Patient Instructions (Signed)
We have sent the following medications to your pharmacy for you to pick up at your convenience: Carafate 1 gm tablet 20-30 minutes prior to meal and at bedtime.   You have been scheduled for a colonoscopy. Please follow written instructions given to you at your visit today.   Please pick up your prep supplies at the pharmacy within the next 1-3 days.  If you use inhalers (even only as needed), please bring them with you on the day of your procedure.  DO NOT TAKE 7 DAYS PRIOR TO TEST- Trulicity (dulaglutide) Ozempic, Wegovy (semaglutide) Mounjaro (tirzepatide) Bydureon Bcise (exanatide extended release)  DO NOT TAKE 1 DAY PRIOR TO YOUR TEST Rybelsus (semaglutide) Adlyxin (lixisenatide) Victoza (liraglutide) Byetta (exanatide) _______________________________________________________  If your blood pressure at your visit was 140/90 or greater, please contact your primary care physician to follow up on this.  _______________________________________________________  If you are age 35 or older, your body mass index should be between 23-30. Your Body mass index is 46.41 kg/m. If this is out of the aforementioned range listed, please consider follow up with your Primary Care Provider.  If you are age 32 or younger, your body mass index should be between 19-25. Your Body mass index is 46.41 kg/m. If this is out of the aformentioned range listed, please consider follow up with your Primary Care Provider.   ________________________________________________________  The [facility] providers would like to encourage you to use [facility] to communicate with providers for non-urgent requests or questions.  Due to long hold times on the telephone, sending your provider a message by [facility] may be a faster and more efficient way to get a response.  Please allow 48 business hours for a response.  Please remember that this is for non-urgent requests.   _______________________________________________________

## 2023-07-19 NOTE — Progress Notes (Signed)
07/19/2023 Jill Shaw [id number] 1976/04/23   HISTORY OF PRESENT ILLNESS:  This is a 47 year old female who is here today with complaints of generalized abdominal pain as well as some nausea/vomiting and inability to eat/tolerate p.o. at times.  She reports abdominal pain all over her abdomen and feels dull, achy, sharp, crampy.  Hot bath helps sometimes.  She has had EGD, 8 CT scans since April 2023, ultrasounds, HIDA scan, gastric emptying scan that have all been unrevealing for cause of her severe symptoms.  She is in the ER multiple times.  She and the person who was with her (? Her mother) asks today multiple times for something for her pain.  She says that Bentyl does not help.  Is on Dexilant.  Has 2 medications for nausea.  Needs colonoscopy due to her age as she has never had one in the past and she is willing to do that and for completion of GI workup.  She has chronic pain in her back and her legs as well.  Needs to work with pain management.  Was seen by them yesterday, but says that all that they could offer was injections so she declined their services because she is allergic to them.  Says that she went to the ER last night and they gave her Dilaudid so that is the only reason she is not in pain right now, but once that wears off within a couple of days then she will be in severe pain again.  Has diagnoses of chronic pain syndrome and fibromyalgia in her chart.   Past Medical History:  Diagnosis Date   Abnormal uterine bleeding (AUB)    Arthritis    knees, hands   Atypical chest pain    cardiology--- dr Rosemary Holms did cardiac cath 08-30-2021 showed minimal luminal irregularity involving LAD all other coronaries w/ normal  flow   Chronic diastolic (congestive) heart failure [facility])    cardiologist---- dr Rosemary Holms;  preserved ef   Chronic iron deficiency anemia    Chronic pain syndrome    followed by pain management--- dr Carlis Abbott   CKD (chronic kidney disease), stage III  (HCC)    Diabetic gastroparesis (HCC)    Diabetic peripheral neuropathy (HCC)    Fibromyalgia    GAD (generalized anxiety disorder)    Generalized abdominal pain    GERD (gastroesophageal reflux disease)    History of acute renal failure 03/21/2023   admission in epic due to N/V/D due ot severe sepsis POA due to UTI   History of chronic gastritis    inflammatory   History of diabetic ketoacidosis    multiple admission's last 3 in epic 06/ 2022;  01/ 2022;   09/ 2021   History of seizure 01/2016   hypoglycemic seizure   History of vertebral compression fracture 12/2020   T11 -- T12 & L1   Hyperlipidemia    Hypertension    followed by pcp   Insulin dependent type 2 diabetes mellitus (HCC)    uncontrolled,  followed by pcp   Irritable bowel syndrome with constipation    MDD (major depressive disorder)    Moderate COPD (chronic obstructive pulmonary disease) (HCC)    pulmology--- dr Tonia Brooms   Moderate persistent asthma    Sickle cell trait (HCC)    Vitamin D deficiency 10/2019   Wears glasses    Past Surgical History:  Procedure Laterality Date   CATARACT EXTRACTION W/ INTRAOCULAR LENS IMPLANT Left    CESAREAN SECTION  2001   for twins   DILATION AND CURETTAGE OF UTERUS N/A 08/20/2019   Procedure: DILATATION AND CURETTAGE;  Surgeon: Allie Bossier, MD;  Location: [facility] OR;  Service: Gynecology;  Laterality: N/A;   DILATION AND CURETTAGE OF UTERUS  05/01/2023   Procedure: DILATATION AND CURETTAGE;  Surgeon: Lorriane Shire, MD;  Location: [facility];  Service: Gynecology;;   ENDOMETRIAL ABLATION N/A 08/20/2019   Procedure: Maren Reamer Ablation;  Surgeon: Allie Bossier, MD;  Location: [facility] OR;  Service: Gynecology;  Laterality: N/A;   EYE SURGERY Bilateral    laser right and cataract removed left eye   HYSTEROSCOPY N/A 05/01/2023   Procedure: HYSTEROSCOPY;  Surgeon: Lorriane Shire, MD;  Location: [facility];  Service: Gynecology;  Laterality: N/A;    INTRAUTERINE DEVICE (IUD) INSERTION N/A 05/01/2023   Procedure: INTRAUTERINE DEVICE (IUD) INSERTION;  Surgeon: Lorriane Shire, MD;  Location:  SURGERY CENTER;  Service: Gynecology;  Laterality: N/A;   LEFT HEART CATH AND CORONARY ANGIOGRAPHY N/A 08/30/2021   Procedure: LEFT HEART CATH AND CORONARY ANGIOGRAPHY;  Surgeon: Elder Negus, MD;  Location: [facility];  Service: Cardiovascular;  Laterality: N/A;   RADIOLOGY WITH ANESTHESIA N/A 09/16/2019   Procedure: MRI WITH ANESTHESIA   L SPINE WITHOUT CONTRAST, T SPINE WITHOUT CONTRAST , CERVICAL WITHOUT CONTRAST;  Surgeon: Radiologist, Medication, MD;  Location: [facility] OR;  Service: Radiology;  Laterality: N/A;   TUBAL LIGATION     interval BTL   UPPER GI ENDOSCOPY  07/2017    reports that she has been smoking cigarettes. She started smoking about 28 years ago. She has a 6.5 pack-year smoking history. She has never used smokeless tobacco. She reports that she does not drink alcohol and does not use drugs. family history includes Breast cancer in her maternal aunt; Colon cancer in her maternal aunt and maternal grandfather; Diabetes in her mother; Hypertension in her mother; Migraines in her paternal grandfather. Allergies  Allergen Reactions   Elavil [Amitriptyline] Other (See Comments)    Coma   Orudis [Ketoprofen] Nausea And Vomiting   Desyrel [Trazodone] Nausea And Vomiting   Tylenol [Acetaminophen] Nausea And Vomiting   Aspirin Nausea Only   Motrin [Ibuprofen] Nausea And Vomiting   Naprosyn [Naproxen] Nausea And Vomiting   Neurontin [Gabapentin] Nausea And Vomiting and Other (See Comments)    upset stomach   Sulfa Antibiotics Nausea And Vomiting   Ultram [Tramadol] Nausea And Vomiting and Other (See Comments)    stomach upset   Victoza [Liraglutide] Nausea And Vomiting   Zegerid [Omeprazole-Sodium Bicarbonate] Nausea And Vomiting      Outpatient Encounter Medications as of 07/19/2023  Medication Sig    Accu-Chek Softclix Lancets lancets USE TO TEST AS DIRECTED UP TO FOUR TIMES DAILY.   albuterol (VENTOLIN HFA) 108 (90 Base) MCG/ACT inhaler Inhale 2 puffs into the lungs every 6 (six) hours as needed for wheezing or shortness of breath.   B-D ULTRAFINE III SHORT PEN 31G X 8 MM MISC USE AS DIRECTED EVERY MORNING, NOON, EVENING, AND AT BEDTIME   Continuous Blood Gluc Sensor (FREESTYLE LIBRE 3 SENSOR) MISC Place 1 sensor on the skin every 14 days. Use to check glucose continuously   Dexlansoprazole 30 MG capsule DR TAKE 1 CAPSULE (30 MG TOTAL) BY MOUTH DAILY. NEEDS OFFICE VISIT FOR ADDITIONAL REFILLS   dicyclomine (BENTYL) 20 MG tablet Take 1 tablet (20 mg total) by mouth 2 (two) times daily.   furosemide (LASIX) 40 MG tablet TAKE  1 TABLET(40 MG) BY MOUTH TWICE DAILY (Patient taking differently: Take 40 mg by mouth 2 (two) times daily.)   glucose blood (ACCU-CHEK GUIDE) test strip USE TO TEST BLOOD GLUCOSE AS DIRECTED UP TO 4 TIMES DAILY   HUMALOG KWIKPEN 200 UNIT/ML KwikPen Inject 50 Units into the skin 3 (three) times daily before meals.   hydrochlorothiazide (MICROZIDE) 12.5 MG capsule TAKE 1 CAPSULE(12.5 MG) BY MOUTH DAILY (Patient taking differently: Take 12.5 mg by mouth daily.)   ibuprofen (ADVIL) 400 MG tablet Take 1 tablet (400 mg total) by mouth every 8 (eight) hours as needed for moderate pain.   losartan (COZAAR) 100 MG tablet TAKE 1 TABLET BY MOUTH EVERY DAY   metoCLOPramide (REGLAN) 10 MG tablet Take 1 tablet (10 mg total) by mouth every 6 (six) hours as needed for nausea.   metoCLOPramide (REGLAN) 10 MG tablet Take 1 tablet (10 mg total) by mouth every 8 (eight) hours as needed for nausea.   metoprolol succinate (TOPROL-XL) 100 MG 24 hr tablet Take 100 mg by mouth daily. Take with or immediately following a meal.   nicotine (NICODERM CQ - DOSED IN MG/24 HOURS) 21 mg/24hr patch Place 1 patch (21 mg total) onto the skin daily.   nitroGLYCERIN (NITROSTAT) 0.4 MG SL tablet Place 1 tablet (0.4  mg total) under the tongue every 5 (five) minutes as needed for chest pain. Additional refills to be filled by PCP, patient aware   norethindrone (AYGESTIN) 5 MG tablet Take 1 tablet (5 mg total) by mouth 3 (three) times daily.   ondansetron (ZOFRAN) 4 MG tablet Take 1 tablet (4 mg total) by mouth every 8 (eight) hours as needed for nausea or vomiting.   Pimozide 1 MG TABS Take by mouth.   potassium chloride SA (KLOR-CON M) 20 MEQ tablet Take 1 tablet (20 mEq total) by mouth daily.   rosuvastatin (CRESTOR) 20 MG tablet TAKE 1 TABLET(20 MG) BY MOUTH DAILY   Simethicone 180 MG CAPS Take 1 capsule (180 mg total) by mouth 3 (three) times daily as needed.   SPIRIVA RESPIMAT 2.5 MCG/ACT AERS INHALE 2 PUFFS INTO THE LUNGS DAILY   spironolactone (ALDACTONE) 50 MG tablet TAKE 1 TABLET(50 MG) BY MOUTH DAILY   SYMBICORT 160-4.5 MCG/ACT inhaler INHALE 2 PUFFS INTO THE LUNGS TWICE DAILY   tiZANidine (ZANAFLEX) 4 MG tablet Take 1 tablet (4 mg total) by mouth every 8 (eight) hours as needed for muscle spasms.   tizanidine (ZANAFLEX) 6 MG capsule TAKE 1 CAPSULE BY MOUTH THREE TIMES A DAY   TOUJEO MAX SOLOSTAR 300 UNIT/ML Solostar Pen Inject 60 Units into the skin in the morning.   No facility-administered encounter medications on file as of 07/19/2023.    REVIEW OF SYSTEMS  : All other systems reviewed and negative except where noted in the History of Present Illness.   PHYSICAL EXAM: BP (!) 180/80   Pulse 100   Ht 5\' 4"  (1.626 m)   Wt 270 lb 6.4 oz (122.7 kg)   BMI 46.41 kg/m  General: Well developed female in no acute distress Head: Normocephalic and atraumatic Eyes:  Sclerae anicteric, conjunctiva pink. Ears: Normal auditory acuity Lungs: Clear throughout to auscultation; no W/R/R. Heart: Regular rate and rhythm; no M/R/G. Rectal:  Will be done at the time of colonoscopy. Musculoskeletal: Symmetrical with no gross deformities  Skin: No lesions on visible extremities Extremities: No edema   Neurological: Alert oriented x 4, grossly non-focal Psychological:  Alert and cooperative. Normal mood and affect  ASSESSMENT AND PLAN: *47 year old female who is here today with complaints of generalized abdominal pain as well as some nausea/vomiting and inability to eat/tolerate p.o. at times.  She reports abdominal pain all over her abdomen.  She has had EGD, 8 CT scans since April 2023, ultrasounds, HIDA scan, gastric emptying scan that have all been unrevealing for cause of her severe symptoms.  She is in the ER multiple times.  She asks today multiple times for something for her pain.  I told her that we do not have a gastrointestinal diagnosis and that I would not be able to prescribe her pain medication.  She says that Bentyl does not help.  Is on Dexilant.  Has 2 medications for nausea.  Needs colonoscopy due to her age as she has never had one in the past and she is willing to do that and for completion of GI workup.  She has chronic pain in her back and her legs as well and has a diagnosis of chronic pain syndrome and fibromyalgia.  Needs to work with pain management.  Was seen by them yesterday, but says that all that they could offer was injections so she declined their services because she is allergic to them.  Nonetheless, will schedule colonoscopy with Dr. Rhea Belton.  Will add Carafate ACHS to see if this helps her symptoms at all in the interim.  Prescription sent to pharmacy.  The risks, benefits, and alternatives to colonoscopy were discussed with the patient and she consents to proceed.  Also advised her to take a dose of her Zofran or metoclopramide about 30 minutes prior to her prep doses.   CC:  Ivonne Andrew, NP

## 2023-07-19 NOTE — Telephone Encounter (Signed)
Please advise Kh 

## 2023-07-20 ENCOUNTER — Other Ambulatory Visit: Payer: Self-pay | Admitting: Nurse Practitioner

## 2023-07-20 ENCOUNTER — Other Ambulatory Visit: Payer: Self-pay

## 2023-07-20 ENCOUNTER — Other Ambulatory Visit (HOSPITAL_BASED_OUTPATIENT_CLINIC_OR_DEPARTMENT_OTHER): Payer: Self-pay

## 2023-07-20 DIAGNOSIS — G894 Chronic pain syndrome: Secondary | ICD-10-CM

## 2023-07-20 DIAGNOSIS — I10 Essential (primary) hypertension: Secondary | ICD-10-CM

## 2023-07-20 MED ORDER — BLOOD PRESSURE KIT KIT
1.0000 | PACK | Freq: Every day | 0 refills | Status: DC
Start: 2023-07-20 — End: 2023-11-26
  Filled 2023-07-20 – 2023-11-07 (×6): qty 1, fill #0
  Filled ????-??-??: fill #0

## 2023-07-20 MED ORDER — TIZANIDINE HCL 4 MG PO TABS
4.0000 mg | ORAL_TABLET | Freq: Three times a day (TID) | ORAL | 0 refills | Status: DC | PRN
  Filled 2023-07-20 – 2023-07-23 (×2): qty 90, 30d supply, fill #0

## 2023-07-20 NOTE — Telephone Encounter (Signed)
Please advise KH 

## 2023-07-23 ENCOUNTER — Other Ambulatory Visit: Payer: Self-pay

## 2023-07-25 ENCOUNTER — Other Ambulatory Visit: Payer: Self-pay

## 2023-07-25 NOTE — Progress Notes (Signed)
Addendum: Reviewed and agree with assessment and management plan. Kadijah Shamoon M, MD  

## 2023-07-26 ENCOUNTER — Encounter
Payer: Medicare Other | Attending: Physical Medicine and Rehabilitation | Admitting: Physical Medicine and Rehabilitation

## 2023-07-26 ENCOUNTER — Other Ambulatory Visit: Payer: Self-pay | Admitting: Nurse Practitioner

## 2023-07-26 ENCOUNTER — Other Ambulatory Visit: Payer: Self-pay

## 2023-07-26 ENCOUNTER — Encounter: Payer: Self-pay | Admitting: Physical Medicine and Rehabilitation

## 2023-07-26 VITALS — BP 199/120 | HR 90 | Ht 64.0 in | Wt 282.0 lb

## 2023-07-26 DIAGNOSIS — G4700 Insomnia, unspecified: Secondary | ICD-10-CM

## 2023-07-26 DIAGNOSIS — M48062 Spinal stenosis, lumbar region with neurogenic claudication: Secondary | ICD-10-CM | POA: Diagnosis not present

## 2023-07-26 DIAGNOSIS — G629 Polyneuropathy, unspecified: Secondary | ICD-10-CM | POA: Diagnosis not present

## 2023-07-26 DIAGNOSIS — I10 Essential (primary) hypertension: Secondary | ICD-10-CM

## 2023-07-26 MED ORDER — TIZANIDINE HCL 4 MG PO TABS: 4.0000 mg | ORAL_TABLET | Freq: Three times a day (TID) | ORAL | 0 refills | Status: DC | PRN

## 2023-07-26 MED ORDER — LOSARTAN POTASSIUM 100 MG PO TABS
100.0000 mg | ORAL_TABLET | Freq: Every day | ORAL | 0 refills | Status: DC
  Filled 2023-07-26: qty 90, 90d supply, fill #0

## 2023-07-26 MED ORDER — TOPIRAMATE 25 MG PO TABS: 25.0000 mg | ORAL_TABLET | Freq: Every day | ORAL | 3 refills | Status: DC

## 2023-07-26 MED ORDER — FUROSEMIDE 40 MG PO TABS
40.0000 mg | ORAL_TABLET | Freq: Two times a day (BID) | ORAL | 1 refills | Status: DC
Start: 2023-07-26 — End: 2023-07-31
  Filled 2023-07-26: qty 180, 90d supply, fill #0

## 2023-07-26 MED ORDER — CAPSAICIN-CLEANSING GEL 8 % EX KIT
4.0000 | PACK | Freq: Once | CUTANEOUS | Status: AC
Start: 2023-07-26 — End: 2023-07-26
  Administered 2023-07-26: 4 via TOPICAL

## 2023-07-26 NOTE — Progress Notes (Signed)
Subjective:    Patient ID: Jill Shaw, female    DOB: Jun 23, 1976, 47 y.o.   MRN: [id number]   HPI:    Jill Shaw is a 47 y.o. female who returns for f/u of inflammatory gastritis, chronic pain, diabetic peripheral neuropathy, and insomnia.   1) Insomnia: -She continues to experience insomnia at night. Asks about Ambien and I discussed that this is an addictive medication and there are other safer options we can try first that can also help with her pain.  -She is currently not taking either of these medications, or Amitriptyline or Trazodone.  -She takes Requip for resltless legs but this does not help. -She is still sleeping very poorly -She does use screens before bed time  2) Back pain secondary to lumbar radiculitis.  -she has been having a hard time finding another pain management clinic -she says she did not mean to request an early refill today, that she said this wrong, she was trying to explain to [place] -she has received 2 warnings for marijuana in her urine sample as well as for not bringing the number of correct pills to her appointments -she asks if tizanidine can be taken more than 4 times per day -She states her pain is located in her lower back radiating into her bilateral lower extremities and bilateral knee pain. She denies falling. She rates her pain 9. Her current exercise regime is walking.  -She was hospitalized for serotonin syndrome after use of Savella and Cymbalta.  -Back pain has been severe -She requests a renewal of her handicap placard as she is unable to walk 200 feet without stopping to rest.  -Her pain has been better controlled with Norco -she would like to get another XR given the chronicity of her pain.  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 -she is concerned that 30 days will end Saturday and she is not able to pick up her oxycodone until Monday as per  pharmacy  3) Diabetic peripheral neuropathy -she would like to try tens unit  -trying magnesium -she would like to repeat Qutenza today, she has had good response with this in the past but it often does not last until the next treatment -has been having a hard time weaning off her medications -she is not getting enough relief from tylenol #3 -she needs refill of her oxycodone -she is currently very limited in her mobility and needs her son's assistance 24/7 at home, she needs a not indicating this for her son to give at work. -she asks whether Qutenza can be administered more frequently than every 3 months, it has been helping a lot.  -she finds great benefits in her legs and found benefit in her hands as well but she kept forgetting and touching her face and this caused her face to birn -she is visiting her mother who has a pneumonia and asks whether her medications can be filled early -she asks whether her oxycodone can be released early as she is going on vacation next week.  -She tolerated Qutenza well and this provided 2 weeks of good relief, benefit started right away.  -Her peripheral neuropathy is worse in her bilateral lower extremities, especially the dorsum and soles of both feet, including the toes.  -now present in her hands as well- she has been dropping objects due to her neuropathy- present on the dorsal and ventral  aspects of her hands -she is interested in following with a dietician -she is interested in medicines for her diabetes -she is trying to lose weight  -needs handicap placard  4) Right shoulder and arm pain -notes limited range of motion in her shoulder -does have pain radiating into right arm from neck and arm feels heavy at times  5) Inflammatory gastritis -has been back and forth to the hospital as this has been really hurting her. She was given pain medication in the hospital. She was given medication for emesis.  -flexeril is making her sicker.  --patient  says she has recently been diagnosed with this condition and pain has been severe. -she has her gastroenterologist recommended that she discuss with [place] increasing her Percocet to better control her pain -she is currently prescribed 3 Percocet per day but feels she would benefit from up to 5 per day  6) GERD -she can have severe reflex at times -she was started on pepcid for a short time period to help with this.   7) Morbid obesity -BMI 47.03 -weight is 274 lbs -she lose 40 lbs when she was sick since she could not tolerate food but she has gained a lot of this back -she asks about supplement she can take for weight loss  8) Migraines: -she has been having headaches and asks if she can get a scan of her head.   9) Diabetic gastroparesis: -this has been severe and she was recommended by ED physician to decrease her opioid usage, but she is nervous to do so because she is in so much pain -she asks about trying tylenol with codeine  10) Postoperative pain: -pain has been really intense -she is not receiving any postoperative pain medications -she has been in a lot of pain with her wean  -her surgeon will not prescribed medications since she goes to pain management clinic  Pain Inventory Average Pain 9 Pain Right Now 9 My pain is sharp, burning, tingling and aching  In the last 24 hours, has pain interfered with the following? General activity 0 Relation with others 0 Enjoyment of life 5 What TIME of day is your pain at its worst? morning , daytime, evening and night Sleep (in general) Poor  Pain is worse with: walking, bending, sitting, inactivity, standing and some activites Pain improves with: heat/ice and medication Relief from Meds: 10  Family History  Problem Relation Age of Onset   Diabetes Mother    Hypertension Mother    Colon cancer Maternal Grandfather    Migraines Paternal Grandfather    Colon cancer Maternal Aunt    Breast cancer Maternal Aunt    Esophageal  cancer Neg Hx    Stomach cancer Neg Hx    Rectal cancer Neg Hx    Social History   Socioeconomic History   Marital status: Divorced    Spouse name: Not on file   Number of children: 3   Years of education: Not on file   Highest education level: Not on file  Occupational History   Occupation: unemployed  Tobacco Use   Smoking status: Some Days    Current packs/day: 0.00    Average packs/day: 0.3 packs/day for 26.0 years (6.5 ttl pk-yrs)    Types: Cigarettes    Start date: 09/13/1994    Last attempt to quit: 09/13/2020    Years since quitting: 2.8   Smokeless tobacco: Never   Tobacco comments:    4-5 cigarettes/day  Vaping Use   Vaping  status: Never Used  Substance and Sexual Activity   Alcohol use: No   Drug use: No   Sexual activity: Not Currently    Birth control/protection: Injection  Other Topics Concern   Not on file  Social History Narrative   Right Handed   Lives in a one story apartment, but lives on the second floor   Drinks caffeine once in awhile   Social Determinants of Health   Financial Resource Strain: Low Risk  (03/18/2023)   Overall Financial Resource Strain (CARDIA)    Difficulty of Paying Living Expenses: Not very hard  Food Insecurity: No Food Insecurity (03/18/2023)   Hunger Vital Sign    Worried About Running Out of Food in the Last Year: Never true    Ran Out of Food in the Last Year: Never true  Transportation Needs: No Transportation Needs (03/19/2023)   PRAPARE - Administrator, Civil Service (Medical): No    Lack of Transportation (Non-Medical): No  Physical Activity: Inactive (03/18/2023)   Exercise Vital Sign    Days of Exercise per Week: 0 days    Minutes of Exercise per Session: 0 min  Stress: Stress Concern Present (03/18/2023)   [employer] of Occupational Health - Occupational Stress Questionnaire    Feeling of Stress : To some extent  Social Connections: Moderately Isolated (03/18/2023)   Social Connection and  Isolation Panel [NHANES]    Frequency of Communication with Friends and Family: More than three times a week    Frequency of Social Gatherings with Friends and Family: Three times a week    Attends Religious Services: 1 to 4 times per year    Active Member of Clubs or Organizations: No    Attends Banker Meetings: Never    Marital Status: Divorced   Past Surgical History:  Procedure Laterality Date   CATARACT EXTRACTION W/ INTRAOCULAR LENS IMPLANT Left    CESAREAN SECTION  2001   for twins   DILATION AND CURETTAGE OF UTERUS N/A 08/20/2019   Procedure: DILATATION AND CURETTAGE;  Surgeon: Allie Bossier, MD;  Location: [facility];  Service: Gynecology;  Laterality: N/A;   DILATION AND CURETTAGE OF UTERUS  05/01/2023   Procedure: DILATATION AND CURETTAGE;  Surgeon: Lorriane Shire, MD;  Location: [facility];  Service: Gynecology;;   ENDOMETRIAL ABLATION N/A 08/20/2019   Procedure: Maren Reamer Ablation;  Surgeon: Allie Bossier, MD;  Location: [facility];  Service: Gynecology;  Laterality: N/A;   EYE SURGERY Bilateral    laser right and cataract removed left eye   HYSTEROSCOPY N/A 05/01/2023   Procedure: HYSTEROSCOPY;  Surgeon: Lorriane Shire, MD;  Location: [facility];  Service: Gynecology;  Laterality: N/A;   INTRAUTERINE DEVICE (IUD) INSERTION N/A 05/01/2023   Procedure: INTRAUTERINE DEVICE (IUD) INSERTION;  Surgeon: Lorriane Shire, MD;  Location: [facility];  Service: Gynecology;  Laterality: N/A;   LEFT HEART CATH AND CORONARY ANGIOGRAPHY N/A 08/30/2021   Procedure: LEFT HEART CATH AND CORONARY ANGIOGRAPHY;  Surgeon: Elder Negus, MD;  [facility];  Service: Cardiovascular;  Laterality: N/A;   RADIOLOGY WITH ANESTHESIA N/A 09/16/2019   Procedure: MRI WITH ANESTHESIA   L SPINE WITHOUT CONTRAST, T SPINE WITHOUT CONTRAST , CERVICAL WITHOUT CONTRAST;  Surgeon: Radiologist, Medication, MD;  Location: [facility];  Service:  Radiology;  Laterality: N/A;   TUBAL LIGATION     interval BTL   UPPER GI ENDOSCOPY  07/2017   Past Surgical History:  Procedure Laterality Date   CATARACT EXTRACTION W/ INTRAOCULAR LENS IMPLANT Left    CESAREAN SECTION  2001   for twins   DILATION AND CURETTAGE OF UTERUS N/A 08/20/2019   Procedure: DILATATION AND CURETTAGE;  Surgeon: Allie Bossier, MD;  [facility];  Service: Gynecology;  Laterality: N/A;   DILATION AND CURETTAGE OF UTERUS  05/01/2023   Procedure: DILATATION AND CURETTAGE;  Surgeon: Lorriane Shire, MD;  Location: [facility];  Service: Gynecology;;   ENDOMETRIAL ABLATION N/A 08/20/2019   Procedure: Maren Reamer Ablation;  Surgeon: Allie Bossier, MD;  [facility];  Service: Gynecology;  Laterality: N/A;   EYE SURGERY Bilateral    laser right and cataract removed left eye   HYSTEROSCOPY N/A 05/01/2023   Procedure: HYSTEROSCOPY;  Surgeon: Lorriane Shire, MD;  Location: [facility];  Service: Gynecology;  Laterality: N/A;   INTRAUTERINE DEVICE (IUD) INSERTION N/A 05/01/2023   Procedure: INTRAUTERINE DEVICE (IUD) INSERTION;  Surgeon: Lorriane Shire, MD;  Location: [facility];  Service: Gynecology;  Laterality: N/A;   LEFT HEART CATH AND CORONARY ANGIOGRAPHY N/A 08/30/2021   Procedure: LEFT HEART CATH AND CORONARY ANGIOGRAPHY;  Surgeon: Elder Negus, MD;  [facility];  Service: Cardiovascular;  Laterality: N/A;   RADIOLOGY WITH ANESTHESIA N/A 09/16/2019   Procedure: MRI WITH ANESTHESIA   L SPINE WITHOUT CONTRAST, T SPINE WITHOUT CONTRAST , CERVICAL WITHOUT CONTRAST;  Surgeon: Radiologist, Medication, MD;  [facility];  Service: Radiology;  Laterality: N/A;   TUBAL LIGATION     interval BTL   UPPER GI ENDOSCOPY  07/2017   Past Medical History:  Diagnosis Date   Abnormal uterine bleeding (AUB)    Arthritis    knees, hands   Atypical chest pain    cardiology--- dr Rosemary Holms did cardiac  cath 08-30-2021 showed minimal luminal irregularity involving LAD all other coronaries w/ normal  flow   Chronic diastolic (congestive) heart failure [facility])    cardiologist---- dr Rosemary Holms;  preserved ef   Chronic iron deficiency anemia    Chronic pain syndrome    followed by pain management--- dr Carlis Abbott   CKD (chronic kidney disease), stage III (HCC)    Diabetic gastroparesis (HCC)    Diabetic peripheral neuropathy (HCC)    Fibromyalgia    GAD (generalized anxiety disorder)    Generalized abdominal pain    GERD (gastroesophageal reflux disease)    History of acute renal failure 03/21/2023   admission in epic due to N/V/D due ot severe sepsis POA due to UTI   History of chronic gastritis    inflammatory   History of diabetic ketoacidosis    multiple admission's last 3 in epic 06/ 2022;  01/ 2022;   09/ 2021   History of seizure 01/2016   hypoglycemic seizure   History of vertebral compression fracture 12/2020   T11 -- T12 & L1   Hyperlipidemia    Hypertension    followed by pcp   Insulin dependent type 2 diabetes mellitus (HCC)    uncontrolled,  followed by pcp   Irritable bowel syndrome with constipation    MDD (major depressive disorder)    Moderate COPD (chronic obstructive pulmonary disease) (HCC)    pulmology--- dr Tonia Brooms   Moderate persistent asthma    Sickle cell trait (HCC)    Vitamin D deficiency 10/2019   Wears glasses    BP (!) 199/120   Pulse 90   Ht 5\' 4"  (1.626 m)  Wt 282 lb (127.9 kg)   SpO2 97%   BMI 48.41 kg/m   Opioid Risk Score:   Fall Risk Score:  `1  Depression screen [facility] 2/9     07/26/2023   10:57 AM 05/18/2023    1:47 PM 04/23/2023   11:51 AM 04/03/2023    2:33 PM 03/21/2023    1:34 PM 02/19/2023   10:00 AM 01/25/2023   10:52 AM  Depression screen PHQ 2/9  Decreased Interest 0 0 0 2 1 0 0  Down, Depressed, Hopeless 0 0 0 2 1 0 0  PHQ - 2 Score 0 0 0 4 2 0 0  Altered sleeping    2     Tired, decreased energy    2     Change in appetite     2     Feeling bad or failure about yourself     0     Trouble concentrating    2     Moving slowly or fidgety/restless    0     Suicidal thoughts    0     PHQ-9 Score    12        Review of Systems  Constitutional:  Positive for diaphoresis and unexpected weight change.  Respiratory:  Positive for shortness of breath and wheezing.   Gastrointestinal:  Positive for abdominal pain, constipation and nausea.  Musculoskeletal:  Positive for arthralgias, back pain, gait problem and myalgias.  Neurological:  Positive for dizziness, weakness and numbness.  Hematological:  Bruises/bleeds easily.  Psychiatric/Behavioral:  Positive for confusion and decreased concentration. The patient is nervous/anxious.   All other systems reviewed and are negative.      Objective:  Gen: no distress, normal appearing HEENT: oral mucosa pink and moist, NCAT Cardio: Reg rate Chest: normal effort, normal rate of breathing Abd: soft, non-distended Ext: no edema Psych: pleasant, normal affect Skin: intact no lesions on feet Neuro: Alert and oriented x3, decreased sensation of bilateral feet     Assessment & Plan:  1. Lumbar Radiculitis: Continue current medication regimen. Continue HEP as Tolerated. Provided with handicap placard. XR ordered. Note for work provided via Clinical cytogeneticist.  -Topamax prescribed, discussed this can help with pain, insomnia, and weight loss, discussed that it calms the nervous system similary to lyrica and gabapentin but whereas these medications can cause weight gain, topamax curbs appetite and usually causes weight loss. -discussed that she has been having a hard time finding another pain clinic -discussed that we are no longer able to prescribed controlled medications for her given her failure to comply with her pain contract with [place]  2. Fibromyalgia: Continue HEP as Tolerated. Continue current Medication regimen. Continue to Monitor.  3. Bilateral Knee Pain: Right knee is currently  worst, XR ordered and will call with results.  Continue to Monitor.  4. Chronic Pain Syndrome: -provided note explaining her inability to work due to her pain -discussed with patient when she last picked up oxycodone and when she is due to pick up next refill, called pharmacy to confirm  5. Insomnia: -Try to go outside near sunrise -Get exercise during the day.  -Discussed good sleep hygiene: turning off all devices an hour before bedtime.  -Chamomile tea with dinner.  -Melatonin did for help. -warm bath or shower before bed. -apply lavender oil for forehead at night  6. Diabetic peripheral neuropathy -provided note for son to give to work to indicate that patient needs his assistance 24/7 due to her  mobility deficits -checked with Qutenza rep and medicaid/medicare will not cover Qutenza more frequently than every 3 months  -refilled tizanidine -Discussed Qutenza as an option for neuropathic pain control. Discussed that this is a capsaicin patch, stronger than capsaicin cream. Discussed that it is currently approved for diabetic peripheral neuropathy and post-herpetic neuralgia, but that it has also shown benefit in treating other forms of neuropathy. Provided patient with link to site to learn more about the patch: https://www.clark.biz/. Discussed that the patch would be placed in office and benefits usually last 3 months. Discussed that unintended exposure to capsaicin can cause severe irritation of eyes, mucous membranes, respiratory tract, and skin, but that Qutenza is a local treatment and does not have the systemic side effects of other nerve medications. Discussed that there may be pain, itching, erythema, and decreased sensory function associated with the application of Qutenza. Side effects usually subside within 1 week. A cold pack of analgesic medications can help with these side effects. Blood pressure can also be increased due to pain associated with administration of the patch.    4 patches of Qutenza was applied to the area of pain. Ice packs were applied during the procedure to ensure patient comfort. Blood pressure was monitored every 15 minutes. The patient tolerated the procedure well. Post-procedure instructions were given and follow-up has been scheduled.    -discussed positive response to last Qutenza treatment  -switch from tylenol #3 to tylenol #4 TID PRN given lack of relief from former  -discussed benefit from TENS unit in office  Prescribed Zynex Nexwave  7. Right sided shoulder and arm pain -discussed that could be a component or frozen shoulder and/or cervical radiculitis -will obtain right shoulder XR and cervical spine XR and call when results are available  8. Obesity: -Educated that current weight is 274 lbs and current BMI is 47.03 -shared foods that can assist in weight loss: 1) leafy greens- high in fiber and nutrients 2) dark chocolate- improves metabolism (if prefer sweetened, best to sweeten with honey instead of sugar).  3) cruciferous vegetables- high in fiber and protein 4) full fat yogurt: high in healthy fat, protein, calcium, and probiotics 5) apples- high in a variety of phytochemicals 6) nuts- high in fiber and protein that increase feelings of fullness 7) grapefruit: rich in nutrients, antioxidants, and fiber (not to be taken with anticoagulation) 8) beans- high in protein and fiber 9) salmon- has high quality protein and healthy fats 10) green tea- rich in polyphenols 11) eggs- rich in choline and vitamin D 12) tuna- high protein, boosts metabolism 13) avocado- decreases visceral abdominal fat 14) chicken (pasture raised): high in protein and iron 15) blueberries- reduce abdominal fat and cholesterol 16) whole grains- decreases calories retained during digestion, speeds metabolism 17) chia seeds- curb appetite 18) chilies- increases fat metabolism  -Discussed supplements that can be used:  1) Metatrim 400mg  BID 30  minutes before breakfast and dinner  2) Sphaeranthus indicus and Garcinia mangostana (combinations of these and #1 can be found in capsicum and zychrome  3) green coffee bean extract 400mg  twice per day or Irvingia (african mango) 150 to 300mg  twice per day.    9) Gastroesophageal reflux disease  -discussed the interaction between Tizanidine and Pepcid and that their additive effects could cause hypotension and bradycardia. -recommended eating small meals -recommended avoiding acidic and spicy foods -recommended apple cider vinegar in a cup of water before meals.   10) Inflammatory gastritis: -recommended following up with GI  11)  Migraines: -Head CT ordered  12) Diabetic gastroparesis: -wean Percocet to 5 tabs per day -add tylenol with codeine TID to replace this, assist with wean -discussed that this has been severe  40 minutes spent in discussion of risks and benefits of Qutenza and obtaining informed consent, discussion of q90 day follow-up and expectation of improvement in pain with each repeat application, discussed that she has been having a hard time finding another pain management clinic, discussed that we are no longer able to prescribe controlled medications for her given failure to comply with pain contract, topamax 25mg  prescribed at night, discussed that this can help with pain/insomnia/weight loss, discussed that it calms the nervous system similary to lyrica and gabapentin but whereas these medications can cause weight gain, topamax curbs appetite and usually causes weight loss.

## 2023-07-26 NOTE — Telephone Encounter (Signed)
Done KH 

## 2023-07-27 ENCOUNTER — Other Ambulatory Visit: Payer: Self-pay

## 2023-07-27 ENCOUNTER — Encounter (HOSPITAL_COMMUNITY): Payer: Self-pay | Admitting: Emergency Medicine

## 2023-07-27 ENCOUNTER — Emergency Department (HOSPITAL_COMMUNITY): Payer: Medicare Other

## 2023-07-27 ENCOUNTER — Emergency Department (HOSPITAL_COMMUNITY)
Admission: EM | Admit: 2023-07-27 | Discharge: 2023-07-27 | Disposition: A | Discharge status: Home / Self Care | Payer: Medicare Other | Attending: Emergency Medicine | Admitting: Emergency Medicine

## 2023-07-27 DIAGNOSIS — Z794 Long term (current) use of insulin: Secondary | ICD-10-CM | POA: Diagnosis not present

## 2023-07-27 DIAGNOSIS — M5441 Lumbago with sciatica, right side: Secondary | ICD-10-CM | POA: Diagnosis not present

## 2023-07-27 DIAGNOSIS — Z79899 Other long term (current) drug therapy: Secondary | ICD-10-CM | POA: Diagnosis not present

## 2023-07-27 DIAGNOSIS — R519 Headache, unspecified: Secondary | ICD-10-CM | POA: Diagnosis not present

## 2023-07-27 DIAGNOSIS — I10 Essential (primary) hypertension: Secondary | ICD-10-CM | POA: Insufficient documentation

## 2023-07-27 DIAGNOSIS — E876 Hypokalemia: Secondary | ICD-10-CM | POA: Diagnosis not present

## 2023-07-27 DIAGNOSIS — R109 Unspecified abdominal pain: Secondary | ICD-10-CM | POA: Insufficient documentation

## 2023-07-27 DIAGNOSIS — N289 Disorder of kidney and ureter, unspecified: Secondary | ICD-10-CM | POA: Insufficient documentation

## 2023-07-27 DIAGNOSIS — M5442 Lumbago with sciatica, left side: Secondary | ICD-10-CM | POA: Diagnosis not present

## 2023-07-27 DIAGNOSIS — G8929 Other chronic pain: Secondary | ICD-10-CM

## 2023-07-27 DIAGNOSIS — R29818 Other symptoms and signs involving the nervous system: Secondary | ICD-10-CM | POA: Diagnosis not present

## 2023-07-27 LAB — COMPREHENSIVE METABOLIC PANEL
ALT: 13 U/L (ref 0–44)
AST: 23 U/L (ref 15–41)
Albumin: 4.1 g/dL (ref 3.5–5.0)
Alkaline Phosphatase: 93 U/L (ref 38–126)
Anion gap: 16 — ABNORMAL HIGH (ref 5–15)
BUN: 10 mg/dL (ref 6–20)
CO2: 19 mmol/L — ABNORMAL LOW (ref 22–32)
Calcium: 9.6 mg/dL (ref 8.9–10.3)
Chloride: 104 mmol/L (ref 98–111)
Creatinine, Ser: 1.34 mg/dL — ABNORMAL HIGH (ref 0.44–1.00)
GFR, Estimated: 50 mL/min — ABNORMAL LOW (ref >60)
Glucose, Bld: 130 mg/dL — ABNORMAL HIGH (ref 70–99)
Potassium: 3.1 mmol/L — ABNORMAL LOW (ref 3.5–5.1)
Sodium: 139 mmol/L (ref 135–145)
Total Bilirubin: 0.4 mg/dL (ref 0.3–1.2)
Total Protein: 7.6 g/dL (ref 6.5–8.1)

## 2023-07-27 LAB — CBC
HCT: 42.5 % (ref 36.0–46.0)
Hemoglobin: 14.3 g/dL (ref 12.0–15.0)
MCH: 27.1 pg (ref 26.0–34.0)
MCHC: 33.6 g/dL (ref 30.0–36.0)
MCV: 80.6 fL (ref 80.0–100.0)
Platelets: 213 10*3/uL (ref 150–400)
RBC: 5.27 MIL/uL — ABNORMAL HIGH (ref 3.87–5.11)
RDW: 19.3 % — ABNORMAL HIGH (ref 11.5–15.5)
WBC: 11.7 10*3/uL — ABNORMAL HIGH (ref 4.0–10.5)
nRBC: 0 % (ref 0.0–0.2)

## 2023-07-27 LAB — URINALYSIS, ROUTINE W REFLEX MICROSCOPIC
Bilirubin Urine: NEGATIVE
Glucose, UA: NEGATIVE mg/dL
Hgb urine dipstick: NEGATIVE
Ketones, ur: NEGATIVE mg/dL
Leukocytes,Ua: NEGATIVE
Nitrite: NEGATIVE
Protein, ur: NEGATIVE mg/dL
Specific Gravity, Urine: 1.012 (ref 1.005–1.030)
pH: 7 (ref 5.0–8.0)

## 2023-07-27 LAB — HCG, SERUM, QUALITATIVE: Preg, Serum: NEGATIVE

## 2023-07-27 LAB — LIPASE, BLOOD: Lipase: 22 U/L (ref 11–51)

## 2023-07-27 MED ORDER — OXYCODONE-ACETAMINOPHEN 5-325 MG PO TABS
1.0000 | ORAL_TABLET | Freq: Once | ORAL | Status: AC
  Administered 2023-07-27: 1 via ORAL
  Filled 2023-07-27: qty 1

## 2023-07-27 MED ORDER — SODIUM CHLORIDE 0.9 % IV BOLUS
1000.0000 mL | Freq: Once | INTRAVENOUS | Status: AC
  Administered 2023-07-27: 1000 mL via INTRAVENOUS

## 2023-07-27 MED ORDER — ONDANSETRON 4 MG PO TBDP: 4.0000 mg | ORAL_TABLET | Freq: Once | ORAL | Status: DC | PRN

## 2023-07-27 NOTE — ED Triage Notes (Signed)
Pt arrives via POV from home with headache, back pain, nausea and vomiting for the last two weeks. Pt reports taking her bp meds but remains high. Pt appears uncomfortable due to multiple complaints.

## 2023-07-27 NOTE — ED Notes (Signed)
Pt removed all monitoring cords, dressed into street clothes and walked to door to ask for help. Pt stating "I need to be hospitalized. I don't feel good". NAD noted at this time.

## 2023-07-27 NOTE — ED Notes (Signed)
EM PA at bedside.

## 2023-07-27 NOTE — ED Provider Notes (Signed)
[facility] AT [facility] Provider Note   CSN: [id number] Arrival date & time: 07/27/23  4098     History  Chief Complaint  Patient presents with   Hypertension   Headache   Back Pain   Emesis    Jill Shaw is a 47 y.o. female.  Patient to ED with c/o headache, abdominal pain, back pain. On chart review, the patient has chronic pain in her abdomen and back. She reports pain is radiating to bilateral legs. No known fall or injury today. She reports she is out of her medication she regularly takes for pain. She reports her symptoms started yesterday and have been constant. She has nausea with vomiting at home. No fever.   Hypertension  Headache Associated symptoms: back pain and vomiting   Back Pain Emesis      Home Medications Prior to Admission medications   Medication Sig Start Date End Date Taking? Authorizing Provider  Accu-Chek Softclix Lancets lancets USE TO TEST AS DIRECTED UP TO FOUR TIMES DAILY. 03/06/22   Shamleffer, Konrad Dolores, MD  albuterol (VENTOLIN HFA) 108 (90 Base) MCG/ACT inhaler Inhale 2 puffs into the lungs every 6 (six) hours as needed for wheezing or shortness of breath. 07/18/21   Luciano Cutter, MD  B-D ULTRAFINE III SHORT PEN 31G X 8 MM MISC USE AS DIRECTED EVERY MORNING, NOON, EVENING, AND AT BEDTIME 07/10/22   Shamleffer, Konrad Dolores, MD  Blood Pressure Monitoring (BLOOD PRESSURE KIT) KIT 1 each by Does not apply route daily. 07/20/23   Ivonne Andrew, NP  Continuous Blood Gluc Sensor (FREESTYLE LIBRE 3 SENSOR) MISC Place 1 sensor on the skin every 14 days. Use to check glucose continuously 07/06/22   Ivonne Andrew, NP  Dexlansoprazole 30 MG capsule DR TAKE 1 CAPSULE (30 MG TOTAL) BY MOUTH DAILY. NEEDS OFFICE VISIT FOR ADDITIONAL REFILLS 03/26/23   Ivonne Andrew, NP  dicyclomine (BENTYL) 20 MG tablet Take 1 tablet (20 mg total) by mouth 2 (two) times daily. 06/05/23   Ivonne Andrew, NP  furosemide  (LASIX) 40 MG tablet Take 1 tablet (40 mg total) by mouth 2 (two) times daily. 07/26/23   Ivonne Andrew, NP  glucose blood (ACCU-CHEK GUIDE) test strip USE TO TEST BLOOD GLUCOSE AS DIRECTED UP TO 4 TIMES DAILY 11/14/22   Ivonne Andrew, NP  HUMALOG KWIKPEN 200 UNIT/ML KwikPen Inject 50 Units into the skin 3 (three) times daily before meals. 06/05/23   Ivonne Andrew, NP  hydrochlorothiazide (MICROZIDE) 12.5 MG capsule TAKE 1 CAPSULE(12.5 MG) BY MOUTH DAILY Patient taking differently: Take 12.5 mg by mouth daily. 12/14/21   Cantwell, Celeste C, PA-C  ibuprofen (ADVIL) 400 MG tablet Take 1 tablet (400 mg total) by mouth every 8 (eight) hours as needed for moderate pain. 07/07/23   Pollyann Savoy, MD  losartan (COZAAR) 100 MG tablet Take 1 tablet (100 mg total) by mouth daily. 07/26/23   Ivonne Andrew, NP  metoCLOPramide (REGLAN) 10 MG tablet Take 1 tablet (10 mg total) by mouth every 6 (six) hours as needed for nausea. 01/17/23   Al Decant, PA-C  metoCLOPramide (REGLAN) 10 MG tablet Take 1 tablet (10 mg total) by mouth every 8 (eight) hours as needed for nausea. 05/12/23   Tilden Fossa, MD  metoprolol succinate (TOPROL-XL) 100 MG 24 hr tablet Take 100 mg by mouth daily. Take with or immediately following a meal.    [provider]  nicotine (NICODERM CQ - DOSED IN MG/24 HOURS) 21 mg/24hr patch Place 1 patch (21 mg total) onto the skin daily. 07/04/23 07/03/24  Ivonne Andrew, NP  nitroGLYCERIN (NITROSTAT) 0.4 MG SL tablet Place 1 tablet (0.4 mg total) under the tongue every 5 (five) minutes as needed for chest pain. Additional refills to be filled by PCP, patient aware 07/09/23   Ivonne Andrew, NP  norethindrone (AYGESTIN) 5 MG tablet Take 1 tablet (5 mg total) by mouth 3 (three) times daily. 07/19/23   Lorriane Shire, MD  ondansetron (ZOFRAN) 4 MG tablet Take 1 tablet (4 mg total) by mouth every 8 (eight) hours as needed for nausea or vomiting. 06/06/23   Ivonne Andrew, NP  Pimozide 1 MG TABS Take by mouth. 02/10/23   [provider]  potassium chloride SA (KLOR-CON M) 20 MEQ tablet Take 1 tablet (20 mEq total) by mouth daily. 03/19/23   Ivonne Andrew, NP  rosuvastatin (CRESTOR) 20 MG tablet TAKE 1 TABLET(20 MG) BY MOUTH DAILY 01/19/23   Patwardhan, Anabel Bene, MD  Simethicone 180 MG CAPS Take 1 capsule (180 mg total) by mouth 3 (three) times daily as needed. 05/12/23   Tilden Fossa, MD  SPIRIVA RESPIMAT 2.5 MCG/ACT AERS INHALE 2 PUFFS INTO THE LUNGS DAILY 02/03/22   Icard, Rachel Bo, DO  spironolactone (ALDACTONE) 50 MG tablet TAKE 1 TABLET(50 MG) BY MOUTH DAILY 12/22/20   Patwardhan, Manish J, MD  sucralfate (CARAFATE) 1 g tablet Take 1 tablet (1 g total) by mouth 4 (four) times daily -  with meals and at bedtime. 07/19/23   Zehr, Princella Pellegrini, PA-C  SYMBICORT 160-4.5 MCG/ACT inhaler INHALE 2 PUFFS INTO THE LUNGS TWICE DAILY 10/31/22   Icard, Rachel Bo, DO  tiZANidine (ZANAFLEX) 4 MG tablet Take 1 tablet (4 mg total) by mouth every 8 (eight) hours as needed for muscle spasms. 07/26/23   Raulkar, Drema Pry, MD  topiramate (TOPAMAX) 25 MG tablet Take 1 tablet (25 mg total) by mouth at bedtime. 07/26/23   Raulkar, Drema Pry, MD  TOUJEO MAX SOLOSTAR 300 UNIT/ML Solostar Pen Inject 60 Units into the skin in the morning. 07/19/23   Ivonne Andrew, NP      Allergies    Elavil [amitriptyline], Orudis [ketoprofen], Desyrel [trazodone], Tylenol [acetaminophen], Aspirin, Motrin [ibuprofen], Naprosyn [naproxen], Neurontin [gabapentin], Sulfa antibiotics, Ultram [tramadol], Victoza [liraglutide], and Zegerid [omeprazole-sodium bicarbonate]    Review of Systems   Review of Systems  Gastrointestinal:  Positive for vomiting.  Musculoskeletal:  Positive for back pain.    Physical Exam Updated Vital Signs BP (!) 136/54   Pulse 84   Temp (!) 97.5 F (36.4 C) (Oral)   Resp 20   Ht 5\' 4"  (1.626 m)   Wt 113.4 kg   SpO2 100%   BMI 42.91 kg/m  Physical Exam Vitals  and nursing note reviewed.  Constitutional:      Appearance: She is well-developed. She is obese.  Eyes:     Extraocular Movements: Extraocular movements intact.  Cardiovascular:     Rate and Rhythm: Normal rate and regular rhythm.     Heart sounds: No murmur heard. Pulmonary:     Effort: Pulmonary effort is normal.     Breath sounds: No wheezing, rhonchi or rales.  Abdominal:     Palpations: Abdomen is soft.     Tenderness: There is abdominal tenderness (Diffusely tender.). There is no guarding.  Musculoskeletal:        General: Normal range  of motion.     Cervical back: Normal range of motion and neck supple.  Skin:    General: Skin is warm and dry.     Findings: No erythema.  Neurological:     Mental Status: She is alert.     Comments: Patient has some repetitive speech, and does not answer questions directly. Good eye contact. Follows command.      ED Results / Procedures / Treatments   Labs (all labs ordered are listed, but only abnormal results are displayed) Labs Reviewed  COMPREHENSIVE METABOLIC PANEL - Abnormal; Notable for the following components:      Result Value   Potassium 3.1 (*)    CO2 19 (*)    Glucose, Bld 130 (*)    Creatinine, Ser 1.34 (*)    GFR, Estimated 50 (*)    Anion gap 16 (*)    All other components within normal limits  CBC - Abnormal; Notable for the following components:   WBC 11.7 (*)    RBC 5.27 (*)    RDW 19.3 (*)    All other components within normal limits  LIPASE, BLOOD  URINALYSIS, ROUTINE W REFLEX MICROSCOPIC  HCG, SERUM, QUALITATIVE    EKG EKG Interpretation Date/Time:  Friday July 27 2023 11:49:37 EDT Ventricular Rate:  93 PR Interval:  157 QRS Duration:  93 QT Interval:  369 QTC Calculation: 459 R Axis:   49  Text Interpretation: Sinus rhythm No significant change since last tracing Confirmed by Linwood Dibbles [phone]) on 07/27/2023 2:22:49 PM  Radiology CT Head Wo Contrast  Result Date: 07/27/2023 CLINICAL  DATA:  Headache, neuro deficit EXAM: CT HEAD WITHOUT CONTRAST TECHNIQUE: Contiguous axial images were obtained from the base of the skull through the vertex without intravenous contrast. RADIATION DOSE REDUCTION: This exam was performed according to the departmental dose-optimization program which includes automated exposure control, adjustment of the mA and/or kV according to patient size and/or use of iterative reconstruction technique. COMPARISON:  02/18/2023 FINDINGS: Brain: No evidence of acute infarction, hemorrhage, hydrocephalus, extra-axial collection or mass lesion/mass effect. Vascular: No hyperdense vessel or unexpected calcification. Skull: Normal. Negative for fracture or focal lesion. Sinuses/Orbits: Paranasal sinuses are clear. Unchanged proptosis bilaterally. Other: None. IMPRESSION: 1. No acute intracranial findings. 2. Unchanged proptosis bilaterally. Electronically Signed   By: Duanne Guess D.O.   On: 07/27/2023 14:34    Procedures Procedures    Medications Ordered in ED Medications  ondansetron (ZOFRAN-ODT) disintegrating tablet 4 mg (has no administration in time range)  oxyCODONE-acetaminophen (PERCOCET/ROXICET) 5-325 MG per tablet 1 tablet (has no administration in time range)  oxyCODONE-acetaminophen (PERCOCET/ROXICET) 5-325 MG per tablet 1 tablet (1 tablet Oral Given 07/27/23 1217)  sodium chloride 0.9 % bolus 1,000 mL (1,000 mLs Intravenous New Bag/Given 07/27/23 1433)    ED Course/ Medical Decision Making/ A&P Clinical Course as of 07/27/23 1532  Fri Jul 27, 2023  1441 Patient reporting she feels she needs to be admitted because her blood pressure hasn't been controlled. Blood pressure: 164/85; 155/87. Discussed better control should be managed by PCP. She initially denies Pain Management involvement but has been regularly involved with Pain Mgmt. [place] PM waiting on records to continue treating. Labs reviewed and are stable. No cause found today for patient's  chronic symptoms. Anticipate discharge.  [SU]  1531 Final exam: no further repetitive speech. She is alert, oriented. No vomiting in ED prior to discharge. All concerns of the patient addressed prior to discharge.  [SU]  Clinical Course User Index [SU] Elpidio Anis, PA-C                                 Medical Decision Making This patient presents to the ED for concern of headache, abdominal pain, back pain with radiation to legs, this involves an extensive number of treatment options, and is a complaint that carries with it a high risk of complications and morbidity.  The differential diagnosis includes: Headache: acute neurologic event, CVA, bleed, mass Abdominal pain: SBO/LBO, diverticulitis, appendicitis, enteritis, perforation or abscess Back pain: cauda equina, nucleus propolsus, spinal abscess   Co morbidities that complicate the patient evaluation  Chronic pain (abdominal, back) on Pain Management Obesity Hypertension ?Altered mental status   Additional history obtained:  Additional history obtained from EMR External records from outside source obtained and reviewed including prescription medication monitoring system   Lab Tests:  I Ordered, and personally interpreted labs.  The pertinent results include:  mild hypokalemia. Cr. 1.34 - will provide fluids. GFR 50, c/w mild renal insufficiency.     Imaging Studies ordered:  I ordered imaging studies including Head CT   Per radiology, no acute intracranial findings.      Problem List / ED Course / Critical interventions / Medication management  Patient with chronic pain. Stable VS, labs essentially unremarkable. She initially had repetitive speech that has cleared on re-examination. Head CT normal. I considered Abd/pel imaging however, symptoms are chronic, no fever, no leukocytosis and multiple CT scans for same. Repeat imaging not felt indicated. Back pain chronic. No new neurologic deficits. No imaging  indicated.   I ordered medication including oxycodine at patient's normal strength  for pain  Reevaluation of the patient after these medicines showed that the patient stayed the same I have reviewed the patients home medicines and have made adjustments as needed   Social Determinants of Health:  Poor insight into self care   Test / Admission - Considered:  Admission not felt warranted for chronic pain. Hypertension is a concern of the patient but is only mildly elevated. Referred back to PCP for continued management. She is referred back to Pain Management for continuation of care of chronic pain.    Amount and/or Complexity of Data Reviewed Labs: ordered. Radiology: ordered.  Risk Prescription drug management.           Final Clinical Impression(s) / ED Diagnoses Final diagnoses:  Chronic abdominal pain  Chronic bilateral low back pain with bilateral sciatica  Acute nonintractable headache, unspecified headache type    Rx / DC Orders ED Discharge Orders     None         Danne Harbor 07/27/23 1532    Linwood Dibbles, MD 07/27/23 [phone]

## 2023-07-27 NOTE — Discharge Instructions (Signed)
Call your Pain Management doctor about possibly moving your appointment up to a closer date.   Follow up with your doctor for recheck and further management of your high blood pressure.

## 2023-07-27 NOTE — ED Notes (Signed)
PT wanted to speak to the PA or MD before d/c. The PA is speaking to her now

## 2023-07-28 ENCOUNTER — Other Ambulatory Visit: Payer: Self-pay

## 2023-07-28 ENCOUNTER — Emergency Department (HOSPITAL_COMMUNITY): Payer: Medicare Other

## 2023-07-28 ENCOUNTER — Observation Stay (HOSPITAL_COMMUNITY)
Admission: EM | Admit: 2023-07-28 | Discharge: 2023-07-31 | Disposition: A | Discharge status: Home / Self Care | Payer: Medicare Other | Attending: Internal Medicine | Admitting: Internal Medicine

## 2023-07-28 ENCOUNTER — Encounter (HOSPITAL_COMMUNITY): Payer: Self-pay | Admitting: *Deleted

## 2023-07-28 DIAGNOSIS — I517 Cardiomegaly: Secondary | ICD-10-CM | POA: Diagnosis not present

## 2023-07-28 DIAGNOSIS — I160 Hypertensive urgency: Principal | ICD-10-CM | POA: Insufficient documentation

## 2023-07-28 DIAGNOSIS — I5033 Acute on chronic diastolic (congestive) heart failure: Secondary | ICD-10-CM | POA: Diagnosis not present

## 2023-07-28 DIAGNOSIS — G894 Chronic pain syndrome: Secondary | ICD-10-CM | POA: Diagnosis present

## 2023-07-28 DIAGNOSIS — E1165 Type 2 diabetes mellitus with hyperglycemia: Secondary | ICD-10-CM | POA: Insufficient documentation

## 2023-07-28 DIAGNOSIS — R0602 Shortness of breath: Secondary | ICD-10-CM | POA: Diagnosis not present

## 2023-07-28 DIAGNOSIS — N183 Chronic kidney disease, stage 3 unspecified: Secondary | ICD-10-CM | POA: Insufficient documentation

## 2023-07-28 DIAGNOSIS — R079 Chest pain, unspecified: Secondary | ICD-10-CM | POA: Insufficient documentation

## 2023-07-28 DIAGNOSIS — E1140 Type 2 diabetes mellitus with diabetic neuropathy, unspecified: Secondary | ICD-10-CM | POA: Diagnosis not present

## 2023-07-28 DIAGNOSIS — F17210 Nicotine dependence, cigarettes, uncomplicated: Secondary | ICD-10-CM | POA: Diagnosis not present

## 2023-07-28 DIAGNOSIS — E1122 Type 2 diabetes mellitus with diabetic chronic kidney disease: Secondary | ICD-10-CM | POA: Diagnosis not present

## 2023-07-28 DIAGNOSIS — R0789 Other chest pain: Secondary | ICD-10-CM | POA: Diagnosis present

## 2023-07-28 DIAGNOSIS — J441 Chronic obstructive pulmonary disease with (acute) exacerbation: Secondary | ICD-10-CM | POA: Insufficient documentation

## 2023-07-28 DIAGNOSIS — Z79899 Other long term (current) drug therapy: Secondary | ICD-10-CM | POA: Diagnosis not present

## 2023-07-28 DIAGNOSIS — I130 Hypertensive heart and chronic kidney disease with heart failure and stage 1 through stage 4 chronic kidney disease, or unspecified chronic kidney disease: Secondary | ICD-10-CM | POA: Insufficient documentation

## 2023-07-28 DIAGNOSIS — G8929 Other chronic pain: Secondary | ICD-10-CM | POA: Diagnosis present

## 2023-07-28 DIAGNOSIS — R1084 Generalized abdominal pain: Secondary | ICD-10-CM | POA: Insufficient documentation

## 2023-07-28 DIAGNOSIS — D519 Vitamin B12 deficiency anemia, unspecified: Secondary | ICD-10-CM | POA: Insufficient documentation

## 2023-07-28 DIAGNOSIS — I5032 Chronic diastolic (congestive) heart failure: Secondary | ICD-10-CM

## 2023-07-28 DIAGNOSIS — J4540 Moderate persistent asthma, uncomplicated: Secondary | ICD-10-CM | POA: Insufficient documentation

## 2023-07-28 DIAGNOSIS — I10 Essential (primary) hypertension: Secondary | ICD-10-CM

## 2023-07-28 DIAGNOSIS — R0989 Other specified symptoms and signs involving the circulatory and respiratory systems: Secondary | ICD-10-CM | POA: Diagnosis not present

## 2023-07-28 DIAGNOSIS — E119 Type 2 diabetes mellitus without complications: Secondary | ICD-10-CM

## 2023-07-28 DIAGNOSIS — Z23 Encounter for immunization: Secondary | ICD-10-CM | POA: Insufficient documentation

## 2023-07-28 LAB — CBC
HCT: 42.9 % (ref 36.0–46.0)
Hemoglobin: 14.3 g/dL (ref 12.0–15.0)
MCH: 26.9 pg (ref 26.0–34.0)
MCHC: 33.3 g/dL (ref 30.0–36.0)
MCV: 80.6 fL (ref 80.0–100.0)
Platelets: 223 10*3/uL (ref 150–400)
RBC: 5.32 MIL/uL — ABNORMAL HIGH (ref 3.87–5.11)
RDW: 17.8 % — ABNORMAL HIGH (ref 11.5–15.5)
WBC: 10.7 10*3/uL — ABNORMAL HIGH (ref 4.0–10.5)
nRBC: 0 % (ref 0.0–0.2)

## 2023-07-28 LAB — BASIC METABOLIC PANEL
Anion gap: 16 — ABNORMAL HIGH (ref 5–15)
BUN: 12 mg/dL (ref 6–20)
CO2: 18 mmol/L — ABNORMAL LOW (ref 22–32)
Calcium: 9.8 mg/dL (ref 8.9–10.3)
Chloride: 105 mmol/L (ref 98–111)
Creatinine, Ser: 1.43 mg/dL — ABNORMAL HIGH (ref 0.44–1.00)
GFR, Estimated: 46 mL/min — ABNORMAL LOW (ref >60)
Glucose, Bld: 280 mg/dL — ABNORMAL HIGH (ref 70–99)
Potassium: 3.8 mmol/L (ref 3.5–5.1)
Sodium: 139 mmol/L (ref 135–145)

## 2023-07-28 LAB — CBG MONITORING, ED
Glucose-Capillary: 265 mg/dL — ABNORMAL HIGH (ref 70–99)
Glucose-Capillary: 304 mg/dL — ABNORMAL HIGH (ref 70–99)

## 2023-07-28 LAB — HCG, SERUM, QUALITATIVE: Preg, Serum: NEGATIVE

## 2023-07-28 LAB — TROPONIN I (HIGH SENSITIVITY)
Troponin I (High Sensitivity): 11 ng/L (ref <18)
Troponin I (High Sensitivity): 21 ng/L — ABNORMAL HIGH (ref <18)

## 2023-07-28 MED ORDER — METOPROLOL SUCCINATE ER 100 MG PO TB24
100.0000 mg | ORAL_TABLET | Freq: Every day | ORAL | Status: DC
  Administered 2023-07-29 – 2023-07-31 (×3): 100 mg via ORAL
  Filled 2023-07-28 (×2): qty 1
  Filled 2023-07-28: qty 4

## 2023-07-28 MED ORDER — NALOXONE HCL 0.4 MG/ML IJ SOLN: 0.4000 mg | INTRAMUSCULAR | Status: DC | PRN

## 2023-07-28 MED ORDER — OXYCODONE HCL 5 MG PO TABS: 5.0000 mg | ORAL_TABLET | Freq: Four times a day (QID) | ORAL | Status: DC | PRN

## 2023-07-28 MED ORDER — SPIRONOLACTONE 25 MG PO TABS
50.0000 mg | ORAL_TABLET | Freq: Every day | ORAL | Status: DC
  Administered 2023-07-29 – 2023-07-31 (×3): 50 mg via ORAL
  Filled 2023-07-28: qty 4
  Filled 2023-07-28 (×3): qty 2

## 2023-07-28 MED ORDER — LEVALBUTEROL HCL 0.63 MG/3ML IN NEBU: 0.6300 mg | INHALATION_SOLUTION | Freq: Three times a day (TID) | RESPIRATORY_TRACT | Status: DC | PRN

## 2023-07-28 MED ORDER — FUROSEMIDE 40 MG PO TABS
40.0000 mg | ORAL_TABLET | Freq: Two times a day (BID) | ORAL | Status: DC
  Administered 2023-07-28 – 2023-07-29 (×2): 40 mg via ORAL
  Filled 2023-07-28 (×2): qty 2

## 2023-07-28 MED ORDER — METOCLOPRAMIDE HCL 5 MG PO TABS
10.0000 mg | ORAL_TABLET | Freq: Three times a day (TID) | ORAL | Status: DC | PRN
  Administered 2023-07-28 – 2023-07-29 (×2): 10 mg via ORAL
  Filled 2023-07-28 (×2): qty 1

## 2023-07-28 MED ORDER — ASPIRIN 81 MG PO CHEW
324.0000 mg | CHEWABLE_TABLET | Freq: Once | ORAL | Status: AC
  Administered 2023-07-28: 324 mg via ORAL
  Filled 2023-07-28: qty 4

## 2023-07-28 MED ORDER — INSULIN ASPART 100 UNIT/ML IJ SOLN
0.0000 [IU] | INTRAMUSCULAR | Status: DC
  Administered 2023-07-28: 7 [IU] via SUBCUTANEOUS
  Administered 2023-07-29: 3 [IU] via SUBCUTANEOUS
  Administered 2023-07-29: 5 [IU] via SUBCUTANEOUS
  Administered 2023-07-29: 7 [IU] via SUBCUTANEOUS
  Administered 2023-07-29: 5 [IU] via SUBCUTANEOUS
  Administered 2023-07-29 (×2): 9 [IU] via SUBCUTANEOUS
  Administered 2023-07-30: 7 [IU] via SUBCUTANEOUS
  Administered 2023-07-30: 5 [IU] via SUBCUTANEOUS
  Administered 2023-07-30: 7 [IU] via SUBCUTANEOUS
  Administered 2023-07-30 (×2): 5 [IU] via SUBCUTANEOUS
  Administered 2023-07-31: 3 [IU] via SUBCUTANEOUS
  Administered 2023-07-31 (×2): 5 [IU] via SUBCUTANEOUS

## 2023-07-28 MED ORDER — HYDROMORPHONE HCL 2 MG PO TABS
1.0000 mg | ORAL_TABLET | Freq: Four times a day (QID) | ORAL | Status: DC | PRN
  Administered 2023-07-29: 1 mg via ORAL

## 2023-07-28 MED ORDER — PANTOPRAZOLE SODIUM 40 MG PO TBEC
40.0000 mg | DELAYED_RELEASE_TABLET | Freq: Every day | ORAL | Status: DC
  Administered 2023-07-29 – 2023-07-31 (×3): 40 mg via ORAL
  Filled 2023-07-28 (×3): qty 1

## 2023-07-28 MED ORDER — HYDROMORPHONE HCL 2 MG PO TABS
1.0000 mg | ORAL_TABLET | Freq: Once | ORAL | Status: AC
  Administered 2023-07-28: 1 mg via ORAL
  Filled 2023-07-28: qty 1

## 2023-07-28 MED ORDER — ONDANSETRON HCL 4 MG/2ML IJ SOLN
4.0000 mg | Freq: Once | INTRAMUSCULAR | Status: AC
  Administered 2023-07-28: 4 mg via INTRAVENOUS
  Filled 2023-07-28: qty 2

## 2023-07-28 MED ORDER — NITROGLYCERIN 0.4 MG SL SUBL: 0.4000 mg | SUBLINGUAL_TABLET | SUBLINGUAL | Status: DC | PRN

## 2023-07-28 MED ORDER — LOSARTAN POTASSIUM 50 MG PO TABS
100.0000 mg | ORAL_TABLET | Freq: Every day | ORAL | Status: DC
  Administered 2023-07-28 – 2023-07-29 (×2): 100 mg via ORAL
  Filled 2023-07-28 (×2): qty 2

## 2023-07-28 MED ORDER — HYDROCHLOROTHIAZIDE 12.5 MG PO TABS
12.5000 mg | ORAL_TABLET | Freq: Every day | ORAL | Status: DC
  Administered 2023-07-28: 12.5 mg via ORAL
  Filled 2023-07-28 (×3): qty 1

## 2023-07-28 NOTE — H&P (Signed)
History and Physical    Jill Shaw LOV:[id number] DOB: 1976-07-23 DOA: 07/28/2023  PCP: Ivonne Andrew, NP  Patient coming from: Home  Chief Complaint: High blood pressure, pain all over  HPI: Jill Shaw is a 47 y.o. female with medical history significant of hypertension, insulin-dependent type 2 diabetes, diabetic gastroparesis, chronic gastritis, peripheral neuropathy, asthma/COPD, chronic HFpEF, abnormal uterine bleeding, class III obesity (BMI 42.91), CKD stage IIIa, fibromyalgia, chronic pain syndrome, anxiety, depression, GERD, hypertension, hyperlipidemia presenting with multiple complaints. Patient is very upset that her pain is not being managed and this is causing her blood pressure to be high.  States for the past 2 weeks her blood pressure has been in the 200s and she thinks this is all related to her pain.  She reports pain all over her body including chest, abdomen, back, and legs.  States she was taking oxycodone at home but ran out and she claims she has not been able to establish care with a pain management specialist.  She reports taking losartan and Lasix at home but noncompliant with her other antihypertensives.  Reports intermittent sharp substernal chest pain for several months.  Also reporting cough and shortness of breath.  She reports chronic generalized abdominal pain, nausea, and vomiting.  No longer actively vomiting at this time.  She is having regular bowel movements.    ED Course: Slightly tachycardic but afebrile.  Blood elevated elevated.  Not hypoxic.  Labs significant for WBC 10.7, hemoglobin 14.3, glucose 280, bicarb 18, anion gap 16, creatinine 1.4 (stable), troponin 11> 21, serum hCG negative, UA not suggestive of infection, normal lipase and LFTs.  CT head negative for acute intracranial abnormality.  Chest x-ray showing cardiomegaly with central pulmonary vascular congestion.  EKG without acute ischemic changes.  Patient was given aspirin 324 mg,  Dilaudid, and her home antihypertensives including Lasix, hydrochlorothiazide, and losartan.  TRH called to admit.  Review of Systems:  Review of Systems  All other systems reviewed and are negative.   Past Medical History:  Diagnosis Date   Abnormal uterine bleeding (AUB)    Arthritis    knees, hands   Atypical chest pain    cardiology--- dr Rosemary Holms did cardiac cath 08-30-2021 showed minimal luminal irregularity involving LAD all other coronaries w/ normal  flow   Chronic diastolic (congestive) heart failure [facility])    cardiologist---- dr Rosemary Holms;  preserved ef   Chronic iron deficiency anemia    Chronic pain syndrome    followed by pain management--- dr Carlis Abbott   CKD (chronic kidney disease), stage III (HCC)    Diabetic gastroparesis (HCC)    Diabetic peripheral neuropathy (HCC)    Fibromyalgia    GAD (generalized anxiety disorder)    Generalized abdominal pain    GERD (gastroesophageal reflux disease)    History of acute renal failure 03/21/2023   admission in epic due to N/V/D due ot severe sepsis POA due to UTI   History of chronic gastritis    inflammatory   History of diabetic ketoacidosis    multiple admission's last 3 in epic 06/ 2022;  01/ 2022;   09/ 2021   History of seizure 01/2016   hypoglycemic seizure   History of vertebral compression fracture 12/2020   T11 -- T12 & L1   Hyperlipidemia    Hypertension    followed by pcp   Insulin dependent type 2 diabetes mellitus (HCC)    uncontrolled,  followed by pcp   Irritable bowel syndrome with constipation  MDD (major depressive disorder)    Moderate COPD (chronic obstructive pulmonary disease) (HCC)    pulmology--- dr Tonia Brooms   Moderate persistent asthma    Sickle cell trait (HCC)    Vitamin D deficiency 10/2019   Wears glasses     Past Surgical History:  Procedure Laterality Date   CATARACT EXTRACTION W/ INTRAOCULAR LENS IMPLANT Left    CESAREAN SECTION  2001   for twins   DILATION AND CURETTAGE  OF UTERUS N/A 08/20/2019   Procedure: DILATATION AND CURETTAGE;  Surgeon: Allie Bossier, MD;  Location: [facility] OR;  Service: Gynecology;  Laterality: N/A;   DILATION AND CURETTAGE OF UTERUS  05/01/2023   Procedure: DILATATION AND CURETTAGE;  Surgeon: Lorriane Shire, MD;  Location: [facility];  Service: Gynecology;;   ENDOMETRIAL ABLATION N/A 08/20/2019   Procedure: Maren Reamer Ablation;  Surgeon: Allie Bossier, MD;  Location: [facility] OR;  Service: Gynecology;  Laterality: N/A;   EYE SURGERY Bilateral    laser right and cataract removed left eye   HYSTEROSCOPY N/A 05/01/2023   Procedure: HYSTEROSCOPY;  Surgeon: Lorriane Shire, MD;  Location: [facility];  Service: Gynecology;  Laterality: N/A;   INTRAUTERINE DEVICE (IUD) INSERTION N/A 05/01/2023   Procedure: INTRAUTERINE DEVICE (IUD) INSERTION;  Surgeon: Lorriane Shire, MD;  Location: [facility];  Service: Gynecology;  Laterality: N/A;   LEFT HEART CATH AND CORONARY ANGIOGRAPHY N/A 08/30/2021   Procedure: LEFT HEART CATH AND CORONARY ANGIOGRAPHY;  Surgeon: Elder Negus, MD;  Location: [facility];  Service: Cardiovascular;  Laterality: N/A;   RADIOLOGY WITH ANESTHESIA N/A 09/16/2019   Procedure: MRI WITH ANESTHESIA   L SPINE WITHOUT CONTRAST, T SPINE WITHOUT CONTRAST , CERVICAL WITHOUT CONTRAST;  Surgeon: Radiologist, Medication, MD;  Location: [facility];  Service: Radiology;  Laterality: N/A;   TUBAL LIGATION     interval BTL   UPPER GI ENDOSCOPY  07/2017     reports that she has been smoking cigarettes. She started smoking about 28 years ago. She has a 6.5 pack-year smoking history. She has never used smokeless tobacco. She reports that she does not drink alcohol and does not use drugs.  Allergies  Allergen Reactions   Elavil [Amitriptyline] Other (See Comments)    Coma   Orudis [Ketoprofen] Nausea And Vomiting   Desyrel [Trazodone] Nausea And Vomiting   Tylenol [Acetaminophen] Nausea  And Vomiting   Aspirin Nausea Only   Motrin [Ibuprofen] Nausea And Vomiting   Naprosyn [Naproxen] Nausea And Vomiting   Neurontin [Gabapentin] Nausea And Vomiting and Other (See Comments)    upset stomach   Sulfa Antibiotics Nausea And Vomiting   Ultram [Tramadol] Nausea And Vomiting and Other (See Comments)    stomach upset   Victoza [Liraglutide] Nausea And Vomiting   Zegerid [Omeprazole-Sodium Bicarbonate] Nausea And Vomiting    Family History  Problem Relation Age of Onset   Diabetes Mother    Hypertension Mother    Colon cancer Maternal Grandfather    Migraines Paternal Grandfather    Colon cancer Maternal Aunt    Breast cancer Maternal Aunt    Esophageal cancer Neg Hx    Stomach cancer Neg Hx    Rectal cancer Neg Hx     Prior to Admission medications   Medication Sig Start Date End Date Taking? Authorizing Provider  Accu-Chek Softclix Lancets lancets USE TO TEST AS DIRECTED UP TO FOUR TIMES DAILY. 03/06/22   Shamleffer, Konrad Dolores, MD  albuterol (VENTOLIN HFA) 108 (  90 Base) MCG/ACT inhaler Inhale 2 puffs into the lungs every 6 (six) hours as needed for wheezing or shortness of breath. 07/18/21   Luciano Cutter, MD  B-D ULTRAFINE III SHORT PEN 31G X 8 MM MISC USE AS DIRECTED EVERY MORNING, NOON, EVENING, AND AT BEDTIME 07/10/22   Shamleffer, Konrad Dolores, MD  Blood Pressure Monitoring (BLOOD PRESSURE KIT) KIT 1 each by Does not apply route daily. 07/20/23   Ivonne Andrew, NP  Continuous Blood Gluc Sensor (FREESTYLE LIBRE 3 SENSOR) MISC Place 1 sensor on the skin every 14 days. Use to check glucose continuously 07/06/22   Ivonne Andrew, NP  Dexlansoprazole 30 MG capsule DR TAKE 1 CAPSULE (30 MG TOTAL) BY MOUTH DAILY. NEEDS OFFICE VISIT FOR ADDITIONAL REFILLS 03/26/23   Ivonne Andrew, NP  dicyclomine (BENTYL) 20 MG tablet Take 1 tablet (20 mg total) by mouth 2 (two) times daily. 06/05/23   Ivonne Andrew, NP  furosemide (LASIX) 40 MG tablet Take 1 tablet (40 mg  total) by mouth 2 (two) times daily. 07/26/23   Ivonne Andrew, NP  glucose blood (ACCU-CHEK GUIDE) test strip USE TO TEST BLOOD GLUCOSE AS DIRECTED UP TO 4 TIMES DAILY 11/14/22   Ivonne Andrew, NP  HUMALOG KWIKPEN 200 UNIT/ML KwikPen Inject 50 Units into the skin 3 (three) times daily before meals. 06/05/23   Ivonne Andrew, NP  hydrochlorothiazide (MICROZIDE) 12.5 MG capsule TAKE 1 CAPSULE(12.5 MG) BY MOUTH DAILY Patient taking differently: Take 12.5 mg by mouth daily. 12/14/21   Cantwell, Celeste C, PA-C  ibuprofen (ADVIL) 400 MG tablet Take 1 tablet (400 mg total) by mouth every 8 (eight) hours as needed for moderate pain. 07/07/23   Pollyann Savoy, MD  losartan (COZAAR) 100 MG tablet Take 1 tablet (100 mg total) by mouth daily. 07/26/23   Ivonne Andrew, NP  metoCLOPramide (REGLAN) 10 MG tablet Take 1 tablet (10 mg total) by mouth every 6 (six) hours as needed for nausea. 01/17/23   Al Decant, PA-C  metoCLOPramide (REGLAN) 10 MG tablet Take 1 tablet (10 mg total) by mouth every 8 (eight) hours as needed for nausea. 05/12/23   Tilden Fossa, MD  metoprolol succinate (TOPROL-XL) 100 MG 24 hr tablet Take 100 mg by mouth daily. Take with or immediately following a meal.    [provider]  nicotine (NICODERM CQ - DOSED IN MG/24 HOURS) 21 mg/24hr patch Place 1 patch (21 mg total) onto the skin daily. 07/04/23 07/03/24  Ivonne Andrew, NP  nitroGLYCERIN (NITROSTAT) 0.4 MG SL tablet Place 1 tablet (0.4 mg total) under the tongue every 5 (five) minutes as needed for chest pain. Additional refills to be filled by PCP, patient aware 07/09/23   Ivonne Andrew, NP  norethindrone (AYGESTIN) 5 MG tablet Take 1 tablet (5 mg total) by mouth 3 (three) times daily. 07/19/23   Lorriane Shire, MD  ondansetron (ZOFRAN) 4 MG tablet Take 1 tablet (4 mg total) by mouth every 8 (eight) hours as needed for nausea or vomiting. 06/06/23   Ivonne Andrew, NP  Pimozide 1 MG TABS Take by mouth.  02/10/23   [provider]  potassium chloride SA (KLOR-CON M) 20 MEQ tablet Take 1 tablet (20 mEq total) by mouth daily. 03/19/23   Ivonne Andrew, NP  rosuvastatin (CRESTOR) 20 MG tablet TAKE 1 TABLET(20 MG) BY MOUTH DAILY 01/19/23   Patwardhan, Anabel Bene, MD  Simethicone 180 MG CAPS Take 1 capsule (  180 mg total) by mouth 3 (three) times daily as needed. 05/12/23   Tilden Fossa, MD  SPIRIVA RESPIMAT 2.5 MCG/ACT AERS INHALE 2 PUFFS INTO THE LUNGS DAILY 02/03/22   Icard, Rachel Bo, DO  spironolactone (ALDACTONE) 50 MG tablet TAKE 1 TABLET(50 MG) BY MOUTH DAILY 12/22/20   Patwardhan, Manish J, MD  sucralfate (CARAFATE) 1 g tablet Take 1 tablet (1 g total) by mouth 4 (four) times daily -  with meals and at bedtime. 07/19/23   Zehr, Princella Pellegrini, PA-C  SYMBICORT 160-4.5 MCG/ACT inhaler INHALE 2 PUFFS INTO THE LUNGS TWICE DAILY 10/31/22   Icard, Rachel Bo, DO  tiZANidine (ZANAFLEX) 4 MG tablet Take 1 tablet (4 mg total) by mouth every 8 (eight) hours as needed for muscle spasms. 07/26/23   Raulkar, Drema Pry, MD  topiramate (TOPAMAX) 25 MG tablet Take 1 tablet (25 mg total) by mouth at bedtime. 07/26/23   Raulkar, Drema Pry, MD  TOUJEO MAX SOLOSTAR 300 UNIT/ML Solostar Pen Inject 60 Units into the skin in the morning. 07/19/23   Ivonne Andrew, NP    Physical Exam: Vitals:   07/28/23 1654 07/28/23 1655 07/28/23 1709 07/28/23 1930  BP: (!) 157/103 (!) 144/103  (!) 167/149  Pulse: (!) 104 (!) 103  (!) 108  Resp:    (!) 39  Temp:      SpO2: 97% 97%    Weight:   113.4 kg   Height:   5\' 4"  (1.626 m)     Physical Exam Vitals reviewed.  Constitutional:      General: She is not in acute distress. HENT:     Head: Normocephalic and atraumatic.  Eyes:     Extraocular Movements: Extraocular movements intact.  Cardiovascular:     Rate and Rhythm: Normal rate and regular rhythm.     Pulses: Normal pulses.  Pulmonary:     Effort: Pulmonary effort is normal. No respiratory distress.     Breath  sounds: Normal breath sounds. No wheezing or rales.  Abdominal:     General: Bowel sounds are normal. There is no distension.     Palpations: Abdomen is soft.     Tenderness: There is no abdominal tenderness. There is no guarding or rebound.  Musculoskeletal:     Cervical back: Normal range of motion.     Comments: Mild bilateral pedal edema  Skin:    General: Skin is warm and dry.  Neurological:     General: No focal deficit present.     Mental Status: She is alert and oriented to person, place, and time.     Labs on Admission: I have personally reviewed following labs and imaging studies  CBC: Recent Labs  Lab 07/27/23 0955 07/28/23 1716  WBC 11.7* 10.7*  HGB 14.3 14.3  HCT 42.5 42.9  MCV 80.6 80.6  PLT 213 223   Basic Metabolic Panel: Recent Labs  Lab 07/27/23 0955 07/28/23 1716  NA 139 139  K 3.1* 3.8  CL 104 105  CO2 19* 18*  GLUCOSE 130* 280*  BUN 10 12  CREATININE 1.34* 1.43*  CALCIUM 9.6 9.8   GFR: Estimated Creatinine Clearance: 60.7 mL/min (A) (by C-G formula based on SCr of 1.43 mg/dL (H)). Liver Function Tests: Recent Labs  Lab 07/27/23 0955  AST 23  ALT 13  ALKPHOS 93  BILITOT 0.4  PROT 7.6  ALBUMIN 4.1   Recent Labs  Lab 07/27/23 0955  LIPASE 22   No results for input(s): "AMMONIA" in the  last 168 hours. Coagulation Profile: No results for input(s): "INR", "PROTIME" in the last 168 hours. Cardiac Enzymes: No results for input(s): "CKTOTAL", "CKMB", "CKMBINDEX", "TROPONINI" in the last 168 hours. BNP (last 3 results) No results for input(s): "PROBNP" in the last 8760 hours. HbA1C: No results for input(s): "HGBA1C" in the last 72 hours. CBG: Recent Labs  Lab 07/28/23 1719  GLUCAP 265*   Lipid Profile: No results for input(s): "CHOL", "HDL", "LDLCALC", "TRIG", "CHOLHDL", "LDLDIRECT" in the last 72 hours. Thyroid Function Tests: No results for input(s): "TSH", "T4TOTAL", "FREET4", "T3FREE", "THYROIDAB" in the last 72  hours. Anemia Panel: No results for input(s): "VITAMINB12", "FOLATE", "FERRITIN", "TIBC", "IRON", "RETICCTPCT" in the last 72 hours. Urine analysis:    Component Value Date/Time   COLORURINE YELLOW 07/27/2023 1047   APPEARANCEUR CLEAR 07/27/2023 1047   LABSPEC 1.012 07/27/2023 1047   PHURINE 7.0 07/27/2023 1047   GLUCOSEU NEGATIVE 07/27/2023 1047   HGBUR NEGATIVE 07/27/2023 1047   BILIRUBINUR NEGATIVE 07/27/2023 1047   BILIRUBINUR negative 12/29/2021 1205   BILIRUBINUR NEGATIVE 11/05/2019 1342   KETONESUR NEGATIVE 07/27/2023 1047   PROTEINUR NEGATIVE 07/27/2023 1047   UROBILINOGEN 1.0 12/29/2021 1205   UROBILINOGEN 0.2 11/12/2020 1148   NITRITE NEGATIVE 07/27/2023 1047   LEUKOCYTESUR NEGATIVE 07/27/2023 1047    Radiological Exams on Admission: DG Chest 2 View  Result Date: 07/28/2023 CLINICAL DATA:  Chest pain and shortness of breath EXAM: CHEST - 2 VIEW COMPARISON:  Chest x-ray 07/07/2023 FINDINGS: The heart is enlarged. There central pulmonary vascular congestion. There is no focal lung consolidation, pleural effusion or pneumothorax. No acute fractures are seen. IMPRESSION: Cardiomegaly with central pulmonary vascular congestion. Electronically Signed   By: Darliss Cheney M.D.   On: 07/28/2023 19:02   CT Head Wo Contrast  Result Date: 07/27/2023 CLINICAL DATA:  Headache, neuro deficit EXAM: CT HEAD WITHOUT CONTRAST TECHNIQUE: Contiguous axial images were obtained from the base of the skull through the vertex without intravenous contrast. RADIATION DOSE REDUCTION: This exam was performed according to the departmental dose-optimization program which includes automated exposure control, adjustment of the mA and/or kV according to patient size and/or use of iterative reconstruction technique. COMPARISON:  02/18/2023 FINDINGS: Brain: No evidence of acute infarction, hemorrhage, hydrocephalus, extra-axial collection or mass lesion/mass effect. Vascular: No hyperdense vessel or unexpected  calcification. Skull: Normal. Negative for fracture or focal lesion. Sinuses/Orbits: Paranasal sinuses are clear. Unchanged proptosis bilaterally. Other: None. IMPRESSION: 1. No acute intracranial findings. 2. Unchanged proptosis bilaterally. Electronically Signed   By: Duanne Guess D.O.   On: 07/27/2023 14:34    EKG: Independently reviewed. Sinus rhythm, slight ST abnormality in lateral leads. No significant change compared to previous tracings.   Assessment and Plan  Acute on chronic HFpEF Echo done in August 2022 showing EF >70%, mild concentric LVH, indeterminate diastolic filling pattern, no significant valvular abnormality.  Patient presenting with complaints of shortness of breath and cough.  Not hypoxic.  Chest x-ray showing cardiomegaly with central pulmonary vascular congestion.  Medication noncompliance likely contributing.  Her home oral Lasix 40 mg twice daily was resumed in the ED.  BNP pending.  Monitor intake and output, daily weights, renal function.  Low-sodium diet with fluid restriction.  Hypertensive urgency Due to noncompliance and pain could also be contributing.  SBP currently 160-170s.  Resume home medications including Lasix, hydrochlorothiazide, losartan, metoprolol, and spironolactone.  Continue to monitor blood pressure closely.  Chronic generalized abdominal pain, nausea, vomiting Abdominal exam benign.  Not actively vomiting in the  ED.  No fever or significant leukocytosis.  Serum hCG negative.  UA not suggestive of infection.  Normal lipase and LFTs.  Symptoms likely due to chronic gastritis and diabetic gastroparesis.  Continue PPI and Reglan as needed.  Chest pain Appears atypical.  She is endorsing intermittent sharp substernal chest pain for several months.  Troponin mildly elevated likely due to demand ischemia in the setting of fluid overload and hypertensive urgency.  Troponin 11> 21.  EKG without acute ischemic changes.  Left heart cath done in November  2022 without evidence of CAD. Continue to trend troponin.  PE less likely given no hypoxia, respiratory distress, or clinical signs of DVT.  Mild tachycardia likely due to beta-blocker noncompliance.  Check D-dimer level.  Insulin-dependent type 2 diabetes Bicarb 18 and anion gap 16 on BMP.  Glucose currently in the 260s.  Bicarb appears to be chronically low.  DKA less likely.  Last A1c 7.0 in March 2024.  Placed on sensitive sliding scale insulin every 4 hours for now.  Repeat A1c ordered.  Unclear what dose of long-acting insulin she is taking at home, pharmacy verification pending.  Repeat BMP ordered.  Fibromyalgia/chronic pain syndrome Patient is very upset that her pain is not being managed.  I have reviewed the note from her recent visit with pain medicine on 07/18/2023.  Concern for drug seeking behavior.  She is refusing oral pain meds and only wants IV Dilaudid.  Continue IV Dilaudid every 6 hours as needed for now.  She will need close outpatient follow-up with pain medicine.  Asthma/COPD Stable, no wheezing.  Xopenex as needed.  GERD Continue PPI.  Code Status: Full Code (discussed with the patient) Level of care: Progressive Care Unit Admission status: It is my clinical opinion that referral for OBSERVATION is reasonable and necessary in this patient based on the above information provided. The aforementioned taken together are felt to place the patient at high risk for further clinical deterioration. However, it is anticipated that the patient may be medically stable for discharge from the hospital within 24 to 48 hours.  John Giovanni MD [facility]  If 7PM-7AM, please contact night-coverage [email or web address]  07/28/2023, 9:26 PM

## 2023-07-28 NOTE — ED Triage Notes (Signed)
Headache no appetite high bp  sugar is up and down c/o weakness for 2 weeks  ems came out this am but the pt refused to come    confused intermittently

## 2023-07-28 NOTE — ED Provider Notes (Signed)
[place] EMERGENCY DEPARTMENT AT [facility] Provider Note   CSN: [id number] Arrival date & time: 07/28/23  1638     History  Chief Complaint  Patient presents with   Hypertension   Shortness of Breath    Jill Shaw is a 47 y.o. female.  HPI Patient presents with her other who assists with history. Patient presents with concern for persistent hypertension as well as a possible episode of delirium earlier today.  She notes a history of hypertension, hyperglycemia, and chronic pain.  She has been seen, evaluated in the emergency department 10 prior times in the past 6 months including yesterday.  She and her mother note that she is transitioning between pain management clinics, after having dissociated from her long-term provider.  She went to a new clinic earlier this week, was unable to obtain the services thought to be necessary.  Since that time she has had worsening of her typical pain which is diffuse, thoracic, abdominal.  Characteristically seemingly no different from ongoing pain she continues to complain of this. Today she was seemingly confused according to her mother.  EMS was called, but the patient declined transport.  Now, several hours later she presents for evaluation.     Home Medications Prior to Admission medications   Medication Sig Start Date End Date Taking? Authorizing Provider  Accu-Chek Softclix Lancets lancets USE TO TEST AS DIRECTED UP TO FOUR TIMES DAILY. 03/06/22   Shamleffer, Konrad Dolores, MD  albuterol (VENTOLIN HFA) 108 (90 Base) MCG/ACT inhaler Inhale 2 puffs into the lungs every 6 (six) hours as needed for wheezing or shortness of breath. 07/18/21   Luciano Cutter, MD  B-D ULTRAFINE III SHORT PEN 31G X 8 MM MISC USE AS DIRECTED EVERY MORNING, NOON, EVENING, AND AT BEDTIME 07/10/22   Shamleffer, Konrad Dolores, MD  Blood Pressure Monitoring (BLOOD PRESSURE KIT) KIT 1 each by Does not apply route daily. 07/20/23   Ivonne Andrew, NP   Continuous Blood Gluc Sensor (FREESTYLE LIBRE 3 SENSOR) MISC Place 1 sensor on the skin every 14 days. Use to check glucose continuously 07/06/22   Ivonne Andrew, NP  Dexlansoprazole 30 MG capsule DR TAKE 1 CAPSULE (30 MG TOTAL) BY MOUTH DAILY. NEEDS OFFICE VISIT FOR ADDITIONAL REFILLS 03/26/23   Ivonne Andrew, NP  dicyclomine (BENTYL) 20 MG tablet Take 1 tablet (20 mg total) by mouth 2 (two) times daily. 06/05/23   Ivonne Andrew, NP  furosemide (LASIX) 40 MG tablet Take 1 tablet (40 mg total) by mouth 2 (two) times daily. 07/26/23   Ivonne Andrew, NP  glucose blood (ACCU-CHEK GUIDE) test strip USE TO TEST BLOOD GLUCOSE AS DIRECTED UP TO 4 TIMES DAILY 11/14/22   Ivonne Andrew, NP  HUMALOG KWIKPEN 200 UNIT/ML KwikPen Inject 50 Units into the skin 3 (three) times daily before meals. 06/05/23   Ivonne Andrew, NP  hydrochlorothiazide (MICROZIDE) 12.5 MG capsule TAKE 1 CAPSULE(12.5 MG) BY MOUTH DAILY Patient taking differently: Take 12.5 mg by mouth daily. 12/14/21   Cantwell, Celeste C, PA-C  ibuprofen (ADVIL) 400 MG tablet Take 1 tablet (400 mg total) by mouth every 8 (eight) hours as needed for moderate pain. 07/07/23   Pollyann Savoy, MD  losartan (COZAAR) 100 MG tablet Take 1 tablet (100 mg total) by mouth daily. 07/26/23   Ivonne Andrew, NP  metoCLOPramide (REGLAN) 10 MG tablet Take 1 tablet (10 mg total) by mouth every 6 (six) hours as needed  for nausea. 01/17/23   Al Decant, PA-C  metoCLOPramide (REGLAN) 10 MG tablet Take 1 tablet (10 mg total) by mouth every 8 (eight) hours as needed for nausea. 05/12/23   Tilden Fossa, MD  metoprolol succinate (TOPROL-XL) 100 MG 24 hr tablet Take 100 mg by mouth daily. Take with or immediately following a meal.    [provider]  nicotine (NICODERM CQ - DOSED IN MG/24 HOURS) 21 mg/24hr patch Place 1 patch (21 mg total) onto the skin daily. 07/04/23 07/03/24  Ivonne Andrew, NP  nitroGLYCERIN (NITROSTAT) 0.4 MG SL tablet  Place 1 tablet (0.4 mg total) under the tongue every 5 (five) minutes as needed for chest pain. Additional refills to be filled by PCP, patient aware 07/09/23   Ivonne Andrew, NP  norethindrone (AYGESTIN) 5 MG tablet Take 1 tablet (5 mg total) by mouth 3 (three) times daily. 07/19/23   Lorriane Shire, MD  ondansetron (ZOFRAN) 4 MG tablet Take 1 tablet (4 mg total) by mouth every 8 (eight) hours as needed for nausea or vomiting. 06/06/23   Ivonne Andrew, NP  Pimozide 1 MG TABS Take by mouth. 02/10/23   [provider]  potassium chloride SA (KLOR-CON M) 20 MEQ tablet Take 1 tablet (20 mEq total) by mouth daily. 03/19/23   Ivonne Andrew, NP  rosuvastatin (CRESTOR) 20 MG tablet TAKE 1 TABLET(20 MG) BY MOUTH DAILY 01/19/23   Patwardhan, Anabel Bene, MD  Simethicone 180 MG CAPS Take 1 capsule (180 mg total) by mouth 3 (three) times daily as needed. 05/12/23   Tilden Fossa, MD  SPIRIVA RESPIMAT 2.5 MCG/ACT AERS INHALE 2 PUFFS INTO THE LUNGS DAILY 02/03/22   Icard, Rachel Bo, DO  spironolactone (ALDACTONE) 50 MG tablet TAKE 1 TABLET(50 MG) BY MOUTH DAILY 12/22/20   Patwardhan, Manish J, MD  sucralfate (CARAFATE) 1 g tablet Take 1 tablet (1 g total) by mouth 4 (four) times daily -  with meals and at bedtime. 07/19/23   Zehr, Princella Pellegrini, PA-C  SYMBICORT 160-4.5 MCG/ACT inhaler INHALE 2 PUFFS INTO THE LUNGS TWICE DAILY 10/31/22   Icard, Rachel Bo, DO  tiZANidine (ZANAFLEX) 4 MG tablet Take 1 tablet (4 mg total) by mouth every 8 (eight) hours as needed for muscle spasms. 07/26/23   Raulkar, Drema Pry, MD  topiramate (TOPAMAX) 25 MG tablet Take 1 tablet (25 mg total) by mouth at bedtime. 07/26/23   Raulkar, Drema Pry, MD  TOUJEO MAX SOLOSTAR 300 UNIT/ML Solostar Pen Inject 60 Units into the skin in the morning. 07/19/23   Ivonne Andrew, NP      Allergies    Elavil [amitriptyline], Orudis [ketoprofen], Desyrel [trazodone], Tylenol [acetaminophen], Aspirin, Motrin [ibuprofen], Naprosyn [naproxen],  Neurontin [gabapentin], Sulfa antibiotics, Ultram [tramadol], Victoza [liraglutide], and Zegerid [omeprazole-sodium bicarbonate]    Review of Systems   Review of Systems  Physical Exam Updated Vital Signs BP (!) 167/149   Pulse (!) 108   Temp 98.7 F (37.1 C)   Resp (!) 39   Ht 5\' 4"  (1.626 m)   Wt 113.4 kg   SpO2 97%   BMI 42.91 kg/m  Physical Exam Vitals and nursing note reviewed.  Constitutional:      General: She is not in acute distress.    Appearance: She is obese. She is not ill-appearing, toxic-appearing or diaphoretic.  HENT:     Head: Normocephalic and atraumatic.  Eyes:     Conjunctiva/sclera: Conjunctivae normal.  Cardiovascular:     Rate and  Rhythm: Regular rhythm. Tachycardia present.  Pulmonary:     Effort: Pulmonary effort is normal. Tachypnea present. No respiratory distress.     Breath sounds: Normal breath sounds. No stridor.  Abdominal:     General: There is no distension.  Skin:    General: Skin is warm and dry.  Neurological:     Mental Status: She is alert and oriented to person, place, and time.     Cranial Nerves: No cranial nerve deficit.     Motor: No weakness or tremor.     Coordination: Coordination normal.  Psychiatric:        Mood and Affect: Mood is anxious.        Cognition and Memory: Cognition is not impaired. Memory is not impaired.     ED Results / Procedures / Treatments   Labs (all labs ordered are listed, but only abnormal results are displayed) Labs Reviewed  BASIC METABOLIC PANEL - Abnormal; Notable for the following components:      Result Value   CO2 18 (*)    Glucose, Bld 280 (*)    Creatinine, Ser 1.43 (*)    GFR, Estimated 46 (*)    Anion gap 16 (*)    All other components within normal limits  CBC - Abnormal; Notable for the following components:   WBC 10.7 (*)    RBC 5.32 (*)    RDW 17.8 (*)    All other components within normal limits  CBG MONITORING, ED - Abnormal; Notable for the following components:    Glucose-Capillary 265 (*)    All other components within normal limits  TROPONIN I (HIGH SENSITIVITY) - Abnormal; Notable for the following components:   Troponin I (High Sensitivity) 21 (*)    All other components within normal limits  HCG, SERUM, QUALITATIVE  BRAIN NATRIURETIC PEPTIDE  TROPONIN I (HIGH SENSITIVITY)    EKG EKG Interpretation Date/Time:  Saturday July 28 2023 17:26:23 EDT Ventricular Rate:  103 PR Interval:  150 QRS Duration:  82 QT Interval:  350 QTC Calculation: 458 R Axis:   -3  Text Interpretation: Sinus tachycardia Non-specific intra-ventricular conduction delay Abnormal ECG Confirmed by Gerhard Munch [phone]) on 07/28/2023 6:30:45 PM  Radiology DG Chest 2 View  Result Date: 07/28/2023 CLINICAL DATA:  Chest pain and shortness of breath EXAM: CHEST - 2 VIEW COMPARISON:  Chest x-ray 07/07/2023 FINDINGS: The heart is enlarged. There central pulmonary vascular congestion. There is no focal lung consolidation, pleural effusion or pneumothorax. No acute fractures are seen. IMPRESSION: Cardiomegaly with central pulmonary vascular congestion. Electronically Signed   By: Darliss Cheney M.D.   On: 07/28/2023 19:02   CT Head Wo Contrast  Result Date: 07/27/2023 CLINICAL DATA:  Headache, neuro deficit EXAM: CT HEAD WITHOUT CONTRAST TECHNIQUE: Contiguous axial images were obtained from the base of the skull through the vertex without intravenous contrast. RADIATION DOSE REDUCTION: This exam was performed according to the departmental dose-optimization program which includes automated exposure control, adjustment of the mA and/or kV according to patient size and/or use of iterative reconstruction technique. COMPARISON:  02/18/2023 FINDINGS: Brain: No evidence of acute infarction, hemorrhage, hydrocephalus, extra-axial collection or mass lesion/mass effect. Vascular: No hyperdense vessel or unexpected calcification. Skull: Normal. Negative for fracture or focal lesion.  Sinuses/Orbits: Paranasal sinuses are clear. Unchanged proptosis bilaterally. Other: None. IMPRESSION: 1. No acute intracranial findings. 2. Unchanged proptosis bilaterally. Electronically Signed   By: Duanne Guess D.O.   On: 07/27/2023 14:34    Procedures Procedures  Medications Ordered in ED Medications  furosemide (LASIX) tablet 40 mg (40 mg Oral Given 07/28/23 2017)  hydrochlorothiazide (HYDRODIURIL) tablet 12.5 mg (has no administration in time range)  losartan (COZAAR) tablet 100 mg (100 mg Oral Given 07/28/23 2018)  nitroGLYCERIN (NITROSTAT) SL tablet 0.4 mg (has no administration in time range)  HYDROmorphone (DILAUDID) tablet 1 mg (1 mg Oral Given 07/28/23 2016)  aspirin chewable tablet 324 mg (324 mg Oral Given 07/28/23 2018)  ondansetron (ZOFRAN) injection 4 mg (4 mg Intravenous Given 07/28/23 2019)    ED Course/ Medical Decision Making/ A&P                                 Medical Decision Making Adult female with multiple medical issues including hypertension, hyperglycemia, and chronic pain now presents c pain (CP / abd pain) concern for hypertension with consideration of possible delirium earlier today.  Patient perseverates on blood pressure being elevated, and on my initial exam it is 140/100.  Patient is overall neurologically unremarkable, is awake, alert, moving all extremity spontaneously to command.  With mild tachycardia, mild tachypnea, and mild hypertension additional considerations include her inability to access her medications, or illness including infection/dehydration.  Consideration of electrolyte abnormalities as well versus HTN emergency.   Amount and/or Complexity of Data Reviewed Independent Historian: parent External Data Reviewed: notes.    Details: Multiple notes from outpatient clinicians reviewed as well as yesterday's ED visit. Labs: ordered. Decision-making details documented in ED Course. Radiology: ordered and independent interpretation  performed. Decision-making details documented in ED Course. ECG/medicine tests: ordered and independent interpretation performed. Decision-making details documented in ED Course.  Risk OTC drugs. Prescription drug management. Decision regarding hospitalization. Diagnosis or treatment significantly limited by social determinants of health.  Per clinic notes, in the past 10 days patient has had interaction with new pain clinic, and her primary care doctor, pertinent recent notes included below: Assessment & Plan:  1. Lumbar Radiculitis: Continue current medication regimen. Continue HEP as Tolerated. Provided with handicap placard. XR ordered. Note for work provided via Clinical cytogeneticist.  -Topamax prescribed, discussed this can help with pain, insomnia, and weight loss, discussed that it calms the nervous system similary to lyrica and gabapentin but whereas these medications can cause weight gain, topamax curbs appetite and usually causes weight loss. -discussed that she has been having a hard time finding another pain clinic -discussed that we are no longer able to prescribed controlled medications for her given her failure to comply with her pain contract with [place]   2. Fibromyalgia: Continue HEP as Tolerated. Continue current Medication regimen. Continue to Monitor.  3. Bilateral Knee Pain: Right knee is currently worst, XR ordered and will call with results.  Continue to Monitor.  4. Chronic Pain Syndrome: -provided note explaining her inability to work due to her pain -discussed with patient when she last picked up oxycodone and when she is due to pick up next refill, called pharmacy to confirm  07/19/2023 CHERYL LESNIK [id number] 02/27/1976     HISTORY OF PRESENT ILLNESS:  This is a 47 year old female who is here today with complaints of generalized abdominal pain as well as some nausea/vomiting and inability to eat/tolerate p.o. at times.  She reports abdominal pain all over her abdomen and feels  dull, achy, sharp, crampy.  Hot bath helps sometimes.  She has had EGD, 8 CT scans since April 2023, ultrasounds, HIDA scan, gastric emptying scan  that have all been unrevealing for cause of her severe symptoms.  She is in the ER multiple times.  She and the person who was with her (? Her mother) asks today multiple times for something for her pain.  She says that Bentyl does not help.  Is on Dexilant.  Has 2 medications for nausea.  Needs colonoscopy due to her age as she has never had one in the past and she is willing to do that and for completion of GI workup.  She has chronic pain in her back and her legs as well.  Needs to work with pain management.  Was seen by them yesterday, but says that all that they could offer was injections so she declined their services because she is allergic to them.  Says that she went to the ER last night and they gave her Dilaudid so that is the only reason she is not in pain right now, but once that wears off within a couple of days then she will be in severe pain again.   Has diagnoses of chronic pain syndrome and fibromyalgia in her chart.   Progress Notes by Brigitte Pulse, RN at 07/18/2023 8:00 AM  Author: Brigitte Pulse, RN Author Type: Registered Nurse Filed: 07/18/2023  6:39 PM  Note Status: Signed Cosign: Cosign Not Required Encounter Date: 07/18/2023  Editor: Brigitte Pulse, RN (Registered Nurse)             Safety precautions to be maintained throughout the outpatient stay will include: orient to surroundings, keep bed in low position, maintain call bell within reach at all times, provide assistance with transfer out of bed and ambulation.     The patient check in at 08:58am with her mother at her side. I welcome them both to the pain clinic and introduced myself. During the admission, the pt stated that she was unable to tolerate steroid injections. The only thing that helps her pain is oxycodone. I explained to her that Dr. Shauna Hugh is not prescribing pain  medication at this time. Her mother got very upset and stated that she rather hear this from Dr. Laban Emperor.  I continued with her admission and asked the pt why she left the other pain clinic. She stated that the pain clinic stopped her oxycodone because the pharmacy was filling her oxcodone too early. Dr. Shauna Hugh entered the room and introduce himself. He informed the pt that he wasn't prescribing pain meds for new patients. Both the patient and mother got very upset and started yelling at Dr. Shauna Hugh. He remained calm the during his conversation with the pt and her mother. They both left the clinic without being discharged.      Update: On repeat exam patient in similar condition, blood pressure waxing, waning, persistently elevated.  With tachypnea, hypertension, pain everywhere some suspicion for hypertensive urgency with heart failure aspects.  Patient received IV Lasix, multiple antihypertensives. Given delta troponin of 11, though the patient's EKG was nonischemic, she does have pain all over and there are some suspicion for metabolic demand contributing to this, but patient will be admitted to our internal medicine colleagues for ongoing monitoring, management blood pressure control        Final Clinical Impression(s) / ED Diagnoses Final diagnoses:  Hypertensive urgency  Atypical chest pain     Gerhard Munch, MD 07/28/23 2122

## 2023-07-29 ENCOUNTER — Encounter (HOSPITAL_COMMUNITY): Payer: Self-pay | Admitting: Internal Medicine

## 2023-07-29 DIAGNOSIS — I5033 Acute on chronic diastolic (congestive) heart failure: Secondary | ICD-10-CM | POA: Diagnosis not present

## 2023-07-29 LAB — GLUCOSE, CAPILLARY
Glucose-Capillary: 241 mg/dL — ABNORMAL HIGH (ref 70–99)
Glucose-Capillary: 296 mg/dL — ABNORMAL HIGH (ref 70–99)
Glucose-Capillary: 369 mg/dL — ABNORMAL HIGH (ref 70–99)
Glucose-Capillary: 393 mg/dL — ABNORMAL HIGH (ref 70–99)

## 2023-07-29 LAB — BASIC METABOLIC PANEL
Anion gap: 13 (ref 5–15)
BUN: 12 mg/dL (ref 6–20)
CO2: 20 mmol/L — ABNORMAL LOW (ref 22–32)
Calcium: 9.7 mg/dL (ref 8.9–10.3)
Chloride: 104 mmol/L (ref 98–111)
Creatinine, Ser: 1.45 mg/dL — ABNORMAL HIGH (ref 0.44–1.00)
GFR, Estimated: 45 mL/min — ABNORMAL LOW (ref >60)
Glucose, Bld: 297 mg/dL — ABNORMAL HIGH (ref 70–99)
Potassium: 3.6 mmol/L (ref 3.5–5.1)
Sodium: 137 mmol/L (ref 135–145)

## 2023-07-29 LAB — SEDIMENTATION RATE: Sed Rate: 8 mm/h (ref 0–22)

## 2023-07-29 LAB — HEMOGLOBIN A1C
Hgb A1c MFr Bld: 8.2 % — ABNORMAL HIGH (ref 4.8–5.6)
Mean Plasma Glucose: 188.64 mg/dL

## 2023-07-29 LAB — TROPONIN I (HIGH SENSITIVITY): Troponin I (High Sensitivity): 12 ng/L (ref <18)

## 2023-07-29 LAB — HIV ANTIBODY (ROUTINE TESTING W REFLEX): HIV Screen 4th Generation wRfx: NONREACTIVE

## 2023-07-29 LAB — D-DIMER, QUANTITATIVE: D-Dimer, Quant: <0.27 ug{FEU}/mL (ref 0.00–0.50)

## 2023-07-29 LAB — VITAMIN B12: Vitamin B-12: 162 pg/mL — ABNORMAL LOW (ref 180–914)

## 2023-07-29 LAB — CBG MONITORING, ED
Glucose-Capillary: 278 mg/dL — ABNORMAL HIGH (ref 70–99)
Glucose-Capillary: 328 mg/dL — ABNORMAL HIGH (ref 70–99)

## 2023-07-29 LAB — BRAIN NATRIURETIC PEPTIDE: B Natriuretic Peptide: 23 pg/mL (ref 0.0–100.0)

## 2023-07-29 MED ORDER — INSULIN GLARGINE-YFGN 100 UNIT/ML ~~LOC~~ SOLN
20.0000 [IU] | Freq: Every day | SUBCUTANEOUS | Status: DC
  Administered 2023-07-29 – 2023-07-30 (×2): 20 [IU] via SUBCUTANEOUS
  Filled 2023-07-29 (×2): qty 0.2

## 2023-07-29 MED ORDER — ENOXAPARIN SODIUM 40 MG/0.4ML IJ SOSY: 40.0000 mg | PREFILLED_SYRINGE | INTRAMUSCULAR | Status: DC

## 2023-07-29 MED ORDER — ORAL CARE MOUTH RINSE: 15.0000 mL | OROMUCOSAL | Status: DC | PRN

## 2023-07-29 MED ORDER — INFLUENZA VIRUS VACC SPLIT PF (FLUZONE) 0.5 ML IM SUSY
0.5000 mL | PREFILLED_SYRINGE | INTRAMUSCULAR | Status: AC
  Administered 2023-07-30: 0.5 mL via INTRAMUSCULAR
  Filled 2023-07-29: qty 0.5

## 2023-07-29 MED ORDER — HYDROMORPHONE HCL 2 MG PO TABS
1.0000 mg | ORAL_TABLET | Freq: Once | ORAL | Status: AC
  Administered 2023-07-29: 1 mg via ORAL
  Filled 2023-07-29: qty 1

## 2023-07-29 MED ORDER — PREGABALIN 25 MG PO CAPS
50.0000 mg | ORAL_CAPSULE | Freq: Two times a day (BID) | ORAL | Status: DC
  Administered 2023-07-29 – 2023-07-31 (×5): 50 mg via ORAL
  Filled 2023-07-29 (×5): qty 2

## 2023-07-29 MED ORDER — MELATONIN 3 MG PO TABS
3.0000 mg | ORAL_TABLET | Freq: Every day | ORAL | Status: DC
  Administered 2023-07-29 – 2023-07-30 (×2): 3 mg via ORAL
  Filled 2023-07-29 (×2): qty 1

## 2023-07-29 MED ORDER — POTASSIUM CHLORIDE CRYS ER 20 MEQ PO TBCR
40.0000 meq | EXTENDED_RELEASE_TABLET | Freq: Once | ORAL | Status: AC
  Administered 2023-07-29: 40 meq via ORAL
  Filled 2023-07-29: qty 2

## 2023-07-29 MED ORDER — ENOXAPARIN SODIUM 60 MG/0.6ML IJ SOSY
0.5000 mg/kg | PREFILLED_SYRINGE | INTRAMUSCULAR | Status: DC
  Administered 2023-07-29 – 2023-07-30 (×2): 60 mg via SUBCUTANEOUS
  Filled 2023-07-29 (×2): qty 0.6

## 2023-07-29 MED ORDER — OXYCODONE HCL 5 MG PO TABS
10.0000 mg | ORAL_TABLET | ORAL | Status: DC | PRN
  Administered 2023-07-29 – 2023-07-31 (×11): 10 mg via ORAL
  Filled 2023-07-29 (×11): qty 2

## 2023-07-29 MED ORDER — PNEUMOCOCCAL 20-VAL CONJ VACC 0.5 ML IM SUSY
0.5000 mL | PREFILLED_SYRINGE | INTRAMUSCULAR | Status: AC
  Administered 2023-07-30: 0.5 mL via INTRAMUSCULAR
  Filled 2023-07-29: qty 0.5

## 2023-07-29 MED ORDER — FUROSEMIDE 10 MG/ML IJ SOLN
40.0000 mg | Freq: Every day | INTRAMUSCULAR | Status: DC
  Filled 2023-07-29: qty 4

## 2023-07-29 MED ORDER — NICOTINE 14 MG/24HR TD PT24
14.0000 mg | MEDICATED_PATCH | Freq: Every day | TRANSDERMAL | Status: DC
  Administered 2023-07-29 – 2023-07-31 (×3): 14 mg via TRANSDERMAL
  Filled 2023-07-29 (×3): qty 1

## 2023-07-29 MED ORDER — HYDRALAZINE HCL 25 MG PO TABS
25.0000 mg | ORAL_TABLET | Freq: Three times a day (TID) | ORAL | Status: DC
  Administered 2023-07-29 – 2023-07-31 (×7): 25 mg via ORAL
  Filled 2023-07-29 (×7): qty 1

## 2023-07-29 MED ORDER — SENNOSIDES-DOCUSATE SODIUM 8.6-50 MG PO TABS
1.0000 | ORAL_TABLET | Freq: Two times a day (BID) | ORAL | Status: DC
  Administered 2023-07-29 – 2023-07-31 (×5): 1 via ORAL
  Filled 2023-07-29 (×5): qty 1

## 2023-07-29 NOTE — ED Notes (Signed)
ED TO INPATIENT HANDOFF REPORT  ED Nurse Name and Phone #: Jess Barters [phone]  S Name/Age/Gender Jill Shaw 47 y.o. female Room/Bed: 001C/001C  Code Status   Code Status: Full Code  Home/SNF/Other Home Patient oriented to: self, place, time, and situation Is this baseline? Yes   Triage Complete: Triage complete  Chief Complaint Hypertensive urgency [I16.0]  Triage Note Headache no appetite high bp  sugar is up and down c/o weakness for 2 weeks  ems came out this am but the pt refused to come    confused intermittently   Allergies Allergies  Allergen Reactions   Elavil [Amitriptyline] Other (See Comments)    Coma   Orudis [Ketoprofen] Nausea And Vomiting   Desyrel [Trazodone] Nausea And Vomiting   Tylenol [Acetaminophen] Nausea And Vomiting   Aspirin Nausea Only   Motrin [Ibuprofen] Nausea And Vomiting   Naprosyn [Naproxen] Nausea And Vomiting   Neurontin [Gabapentin] Nausea And Vomiting and Other (See Comments)    upset stomach   Sulfa Antibiotics Nausea And Vomiting   Ultram [Tramadol] Nausea And Vomiting and Other (See Comments)    stomach upset   Victoza [Liraglutide] Nausea And Vomiting   Zegerid [Omeprazole-Sodium Bicarbonate] Nausea And Vomiting    Level of Care/Admitting Diagnosis ED Disposition     ED Disposition  Admit   Condition  --   Comment  Hospital Area: [facility] [[id number]]  Level of Care: Progressive [102]  Admit to Progressive based on following criteria: MULTISYSTEM THREATS such as stable sepsis, metabolic/electrolyte imbalance with or without encephalopathy that is responding to early treatment.  May place patient in observation at [facility] or [facility] if equivalent level of care is available:: Yes  Covid Evaluation: Asymptomatic - no recent exposure (last 10 days) testing not required  Diagnosis: Hypertensive urgency [650126]  Admitting Physician: John Giovanni [[id number]]  Attending Physician:  John Giovanni [[id number]]          B Medical/Surgery History Past Medical History:  Diagnosis Date   Abnormal uterine bleeding (AUB)    Arthritis    knees, hands   Atypical chest pain    cardiology--- dr Rosemary Holms did cardiac cath 08-30-2021 showed minimal luminal irregularity involving LAD all other coronaries w/ normal  flow   Chronic diastolic (congestive) heart failure [facility])    cardiologist---- dr Rosemary Holms;  preserved ef   Chronic iron deficiency anemia    Chronic pain syndrome    followed by pain management--- dr Carlis Abbott   CKD (chronic kidney disease), stage III (HCC)    Diabetic gastroparesis (HCC)    Diabetic peripheral neuropathy (HCC)    Fibromyalgia    GAD (generalized anxiety disorder)    Generalized abdominal pain    GERD (gastroesophageal reflux disease)    History of acute renal failure 03/21/2023   admission in epic due to N/V/D due ot severe sepsis POA due to UTI   History of chronic gastritis    inflammatory   History of diabetic ketoacidosis    multiple admission's last 3 in epic 06/ 2022;  01/ 2022;   09/ 2021   History of seizure 01/2016   hypoglycemic seizure   History of vertebral compression fracture 12/2020   T11 -- T12 & L1   Hyperlipidemia    Hypertension    followed by pcp   Insulin dependent type 2 diabetes mellitus (HCC)    uncontrolled,  followed by pcp   Irritable bowel syndrome with constipation    MDD (major depressive disorder)  Moderate COPD (chronic obstructive pulmonary disease) (HCC)    pulmology--- dr Tonia Brooms   Moderate persistent asthma    Sickle cell trait (HCC)    Vitamin D deficiency 10/2019   Wears glasses    Past Surgical History:  Procedure Laterality Date   CATARACT EXTRACTION W/ INTRAOCULAR LENS IMPLANT Left    CESAREAN SECTION  2001   for twins   DILATION AND CURETTAGE OF UTERUS N/A 08/20/2019   Procedure: DILATATION AND CURETTAGE;  Surgeon: Allie Bossier, MD;  Location: [facility] OR;  Service: Gynecology;   Laterality: N/A;   DILATION AND CURETTAGE OF UTERUS  05/01/2023   Procedure: DILATATION AND CURETTAGE;  Surgeon: Lorriane Shire, MD;  Location: [facility];  Service: Gynecology;;   ENDOMETRIAL ABLATION N/A 08/20/2019   Procedure: Maren Reamer Ablation;  Surgeon: Allie Bossier, MD;  Location: [facility] OR;  Service: Gynecology;  Laterality: N/A;   EYE SURGERY Bilateral    laser right and cataract removed left eye   HYSTEROSCOPY N/A 05/01/2023   Procedure: HYSTEROSCOPY;  Surgeon: Lorriane Shire, MD;  Location: [facility];  Service: Gynecology;  Laterality: N/A;   INTRAUTERINE DEVICE (IUD) INSERTION N/A 05/01/2023   Procedure: INTRAUTERINE DEVICE (IUD) INSERTION;  Surgeon: Lorriane Shire, MD;  Location: [facility];  Service: Gynecology;  Laterality: N/A;   LEFT HEART CATH AND CORONARY ANGIOGRAPHY N/A 08/30/2021   Procedure: LEFT HEART CATH AND CORONARY ANGIOGRAPHY;  Surgeon: Elder Negus, MD;  Location: [facility];  Service: Cardiovascular;  Laterality: N/A;   RADIOLOGY WITH ANESTHESIA N/A 09/16/2019   Procedure: MRI WITH ANESTHESIA   L SPINE WITHOUT CONTRAST, T SPINE WITHOUT CONTRAST , CERVICAL WITHOUT CONTRAST;  Surgeon: Radiologist, Medication, MD;  Location: [facility] OR;  Service: Radiology;  Laterality: N/A;   TUBAL LIGATION     interval BTL   UPPER GI ENDOSCOPY  07/2017     A IV Location/Drains/Wounds Patient Lines/Drains/Airways Status     Active Line/Drains/Airways     Name Placement date Placement time Site Days   Peripheral IV 07/28/23 20 G Right Antecubital 07/28/23  1912  Antecubital  1            Intake/Output Last 24 hours No intake or output data in the 24 hours ending 07/29/23 0848  Labs/Imaging Results for orders placed or performed during the hospital encounter of 07/28/23 (from the past 48 hour(s))  Basic metabolic panel     Status: Abnormal   Collection Time: 07/28/23  5:16 PM  Result Value Ref Range    Sodium 139 135 - 145 mmol/L   Potassium 3.8 3.5 - 5.1 mmol/L   Chloride 105 98 - 111 mmol/L   CO2 18 (L) 22 - 32 mmol/L   Glucose, Bld 280 (H) 70 - 99 mg/dL    Comment: Glucose reference range applies only to samples taken after fasting for at least 8 hours.   BUN 12 6 - 20 mg/dL   Creatinine, Ser 9.56 (H) 0.44 - 1.00 mg/dL   Calcium 9.8 8.9 - 21.3 mg/dL   GFR, Estimated 46 (L) >60 mL/min    Comment: (NOTE) Calculated using the CKD-EPI Creatinine Equation (2021)    Anion gap 16 (H) 5 - 15    Comment: Performed at [facility], [address], [place], [place] [address]  CBC     Status: Abnormal   Collection Time: 07/28/23  5:16 PM  Result Value Ref Range   WBC 10.7 (H) 4.0 - 10.5  K/uL   RBC 5.32 (H) 3.87 - 5.11 MIL/uL   Hemoglobin 14.3 12.0 - 15.0 g/dL   HCT 81.1 91.4 - 78.2 %   MCV 80.6 80.0 - 100.0 fL   MCH 26.9 26.0 - 34.0 pg   MCHC 33.3 30.0 - 36.0 g/dL   RDW 95.6 (H) 21.3 - 08.6 %   Platelets 223 150 - 400 K/uL    Comment: REPEATED TO VERIFY   nRBC 0.0 0.0 - 0.2 %    Comment: Performed at [facility], [address] [place], [place] [address]  Troponin I (High Sensitivity)     Status: None   Collection Time: 07/28/23  5:16 PM  Result Value Ref Range   Troponin I (High Sensitivity) 11 <18 ng/L    Comment: (NOTE) Elevated high sensitivity troponin I (hsTnI) values and significant  changes across serial measurements may suggest ACS but many other  chronic and acute conditions are known to elevate hsTnI results.  Refer to the "Links" section for chest pain algorithms and additional  guidance. Performed at [facility], [address] [place], [place] [address]   hCG, serum, qualitative     Status: None   Collection Time: 07/28/23  5:16 PM  Result Value Ref Range   Preg, Serum NEGATIVE NEGATIVE    Comment:        THE SENSITIVITY OF THIS METHODOLOGY IS >10 mIU/mL. Performed at [facility], [address] [place], [place] [address]   CBG  monitoring, ED     Status: Abnormal   Collection Time: 07/28/23  5:19 PM  Result Value Ref Range   Glucose-Capillary 265 (H) 70 - 99 mg/dL    Comment: Glucose reference range applies only to samples taken after fasting for at least 8 hours.  Troponin I (High Sensitivity)     Status: Abnormal   Collection Time: 07/28/23  7:11 PM  Result Value Ref Range   Troponin I (High Sensitivity) 21 (H) <18 ng/L    Comment: (NOTE) Elevated high sensitivity troponin I (hsTnI) values and significant  changes across serial measurements may suggest ACS but many other  chronic and acute conditions are known to elevate hsTnI results.  Refer to the "Links" section for chest pain algorithms and additional  guidance. Performed at [facility], [address], [place], [place] [address]   CBG monitoring, ED     Status: Abnormal   Collection Time: 07/28/23 11:29 PM  Result Value Ref Range   Glucose-Capillary 304 (H) 70 - 99 mg/dL    Comment: Glucose reference range applies only to samples taken after fasting for at least 8 hours.  Brain natriuretic peptide     Status: None   Collection Time: 07/29/23  2:07 AM  Result Value Ref Range   B Natriuretic Peptide 23.0 0.0 - 100.0 pg/mL    Comment: Performed at [facility], [address], [place], [place] [address]  HIV Antibody (routine testing w rflx)     Status: None   Collection Time: 07/29/23  2:07 AM  Result Value Ref Range   HIV Screen 4th Generation wRfx Non Reactive Non Reactive    Comment: Performed at [facility], [address], [place], [place] [address]  Basic metabolic panel     Status: Abnormal   Collection Time: 07/29/23  2:07 AM  Result Value Ref Range   Sodium 137 135 - 145 mmol/L   Potassium 3.6 3.5 - 5.1  mmol/L   Chloride 104 98 - 111 mmol/L   CO2 20 (L) 22 - 32 mmol/L   Glucose, Bld 297 (H) 70 - 99 mg/dL    Comment: Glucose reference range applies only to samples taken after fasting for at least 8 hours.   BUN 12 6  - 20 mg/dL   Creatinine, Ser 6.96 (H) 0.44 - 1.00 mg/dL   Calcium 9.7 8.9 - 29.5 mg/dL   GFR, Estimated 45 (L) >60 mL/min    Comment: (NOTE) Calculated using the CKD-EPI Creatinine Equation (2021)    Anion gap 13 5 - 15    Comment: Performed at [facility], [address], [place], [place] [address]  Hemoglobin A1c     Status: Abnormal   Collection Time: 07/29/23  2:07 AM  Result Value Ref Range   Hgb A1c MFr Bld 8.2 (H) 4.8 - 5.6 %    Comment: (NOTE) Pre diabetes:          5.7%-6.4%  Diabetes:              >6.4%  Glycemic control for   <7.0% adults with diabetes    Mean Plasma Glucose 188.64 mg/dL    Comment: Performed at [facility], [address] [place], [place] [address]  Troponin I (High Sensitivity)     Status: None   Collection Time: 07/29/23  2:07 AM  Result Value Ref Range   Troponin I (High Sensitivity) 12 <18 ng/L    Comment: (NOTE) Elevated high sensitivity troponin I (hsTnI) values and significant  changes across serial measurements may suggest ACS but many other  chronic and acute conditions are known to elevate hsTnI results.  Refer to the "Links" section for chest pain algorithms and additional  guidance. Performed at [facility], [address] [place], [place] [address]   D-dimer, quantitative     Status: None   Collection Time: 07/29/23  2:07 AM  Result Value Ref Range   D-Dimer, Quant <0.27 0.00 - 0.50 ug/mL-FEU    Comment: (NOTE) At the manufacturer cut-off value of 0.5 g/mL FEU, this assay has a negative predictive value of 95-100%.This assay is intended for use in conjunction with a clinical pretest probability (PTP) assessment model to exclude pulmonary embolism (PE) and deep venous thrombosis (DVT) in outpatients suspected of PE or DVT. Results should be correlated with clinical presentation. Performed at [facility], [address] [place], [place] [address]   CBG monitoring, ED     Status: Abnormal    Collection Time: 07/29/23  3:51 AM  Result Value Ref Range   Glucose-Capillary 278 (H) 70 - 99 mg/dL    Comment: Glucose reference range applies only to samples taken after fasting for at least 8 hours.  CBG monitoring, ED     Status: Abnormal   Collection Time: 07/29/23  7:57 AM  Result Value Ref Range   Glucose-Capillary 328 (H) 70 - 99 mg/dL    Comment: Glucose reference range applies only to samples taken after fasting for at least 8 hours.   *Note: Due to a large number of results and/or encounters for the requested time period, some results have not been displayed. A complete set of results can be found in Results Review.   DG Chest 2 View  Result Date: 07/28/2023 CLINICAL DATA:  Chest pain and shortness of breath EXAM: CHEST - 2 VIEW COMPARISON:  Chest x-ray 07/07/2023 FINDINGS: The heart is enlarged. There central pulmonary  vascular congestion. There is no focal lung consolidation, pleural effusion or pneumothorax. No acute fractures are seen. IMPRESSION: Cardiomegaly with central pulmonary vascular congestion. Electronically Signed   By: Darliss Cheney M.D.   On: 07/28/2023 19:02   CT Head Wo Contrast  Result Date: 07/27/2023 CLINICAL DATA:  Headache, neuro deficit EXAM: CT HEAD WITHOUT CONTRAST TECHNIQUE: Contiguous axial images were obtained from the base of the skull through the vertex without intravenous contrast. RADIATION DOSE REDUCTION: This exam was performed according to the departmental dose-optimization program which includes automated exposure control, adjustment of the mA and/or kV according to patient size and/or use of iterative reconstruction technique. COMPARISON:  02/18/2023 FINDINGS: Brain: No evidence of acute infarction, hemorrhage, hydrocephalus, extra-axial collection or mass lesion/mass effect. Vascular: No hyperdense vessel or unexpected calcification. Skull: Normal. Negative for fracture or focal lesion. Sinuses/Orbits: Paranasal sinuses are clear. Unchanged  proptosis bilaterally. Other: None. IMPRESSION: 1. No acute intracranial findings. 2. Unchanged proptosis bilaterally. Electronically Signed   By: Duanne Guess D.O.   On: 07/27/2023 14:34    Pending Labs Unresulted Labs (From admission, onward)     Start     Ordered   07/29/23 0710  Sedimentation rate  Add-on,   AD        07/29/23 0709   07/29/23 0710  High sensitivity CRP  Add-on,   AD        07/29/23 0709            Vitals/Pain Today's Vitals   07/29/23 0700 07/29/23 0707 07/29/23 0808 07/29/23 0820  BP: (!) 170/89   (!) 145/95  Pulse:    88  Resp:    18  Temp:      TempSrc:      SpO2:    97%  Weight:      Height:      PainSc:  10-Worst pain ever 10-Worst pain ever     Isolation Precautions No active isolations  Medications Medications  furosemide (LASIX) tablet 40 mg (40 mg Oral Given 07/29/23 0824)  losartan (COZAAR) tablet 100 mg (100 mg Oral Given 07/28/23 2018)  metoprolol succinate (TOPROL-XL) 24 hr tablet 100 mg (has no administration in time range)  spironolactone (ALDACTONE) tablet 50 mg (has no administration in time range)  metoCLOPramide (REGLAN) tablet 10 mg (10 mg Oral Given 07/28/23 2330)  naloxone (NARCAN) injection 0.4 mg (has no administration in time range)  insulin aspart (novoLOG) injection 0-9 Units (7 Units Subcutaneous Given 07/29/23 0809)  pantoprazole (PROTONIX) EC tablet 40 mg (has no administration in time range)  HYDROmorphone (DILAUDID) tablet 1 mg (1 mg Oral Given 07/29/23 0210)  levalbuterol (XOPENEX) nebulizer solution 0.63 mg (has no administration in time range)  hydrALAZINE (APRESOLINE) tablet 25 mg (has no administration in time range)  HYDROmorphone (DILAUDID) tablet 1 mg (1 mg Oral Given 07/28/23 2016)  aspirin chewable tablet 324 mg (324 mg Oral Given 07/28/23 2018)  ondansetron (ZOFRAN) injection 4 mg (4 mg Intravenous Given 07/28/23 2019)  HYDROmorphone (DILAUDID) tablet 1 mg (1 mg Oral Given 07/29/23 0824)     Mobility Walks w/ cane     Focused Assessments    R Recommendations: See Admitting Provider Note  Report given to:   Additional Notes:

## 2023-07-29 NOTE — Progress Notes (Signed)
PROGRESS NOTE    Jill Shaw  OVF:[id number] DOB: July 06, 1976 DOA: 07/28/2023 PCP: Jill Andrew, NP  [name] w hypertension, insulin-dependent type 2 diabetes, diabetic gastroparesis, chronic gastritis, peripheral neuropathy, asthma/COPD, chronic HFpEF, abnormal uterine bleeding, class III obesity (BMI 42.91), CKD stage IIIa, fibromyalgia, chronic pain syndrome, anxiety, depression, GERD, hypertension, hyperlipidemia presenting with multiple complaints.  Primary complaint is diffuse pain all over which is reportedly causing her blood pressure to be high, ran out of oxycodone and is trying to establish care at a new pain medicine clinic.  Also reports some dyspnea on exertion -In the ED she was hypertensive blood blood pressure in the 200s, CBGs 290, creatinine 1.4, troponin 11, hemoglobin 14.3, WBC 10.7, serum hCG negative, UA not suggestive of infection, normal lipase and LFTs.  CT head negative for acute intracranial abnormality.  Chest x-ray showing cardiomegaly with central pulmonary vascular congestion.     Subjective: -Complains of pain all over  Assessment and Plan:  Acute on chronic HFpEF -Last echo 8/22 with a EF > 70%, mild LVH, indeterminate diastolic function -Mild symptoms of dyspnea on exertion, likely worsened by accelerated hypertension  -Continue IV Lasix today  -Discussed diet and lifestyle modification, dietitian consult, add SGLT2i tomorrow   Hypertensive urgency -Limited compliance, pain also contributing, DC HCTZ, Lasix today, continue losartan metoprolol and Aldactone, add low-dose hydralazine  Chronic pain, generalized -Narcotic dependence -Resume home regimen of oxycodone -Reattempt trial of Lyrica for neuropathy -Check ESR, CRP, B12   Chest pain -Chronic, atypical, ACS ruled out troponin and D-dimer negative   Insulin-dependent type 2 diabetes -Uncontrolled with hyperglycemia, last A1c 7.0 in March 2024. -Add Semglee   Asthma/COPD Stable, no  wheezing.  Xopenex as needed.   GERD Continue PPI.   DVT prophylaxis: Lovenox Code Status: Full Code (discussed with the patient)  Family Communication: None present Disposition Plan: Home tomorrow if stable  Consultants:    Procedures:   Antimicrobials:    Objective: Vitals:   07/29/23 0906 07/29/23 0929 07/29/23 0930 07/29/23 0956  BP:  (!) 144/98 (!) 144/98 (!) 177/96  Pulse:  88 100 95  Resp:  16  19  Temp: 98.5 F (36.9 C)   98.5 F (36.9 C)  TempSrc: Oral   Oral  SpO2:  98%  99%  Weight:    122.1 kg  Height:    5\' 4"  (1.626 m)    Intake/Output Summary (Last 24 hours) at 07/29/2023 1025 Last data filed at 07/29/2023 0913 Gross per 24 hour  Intake --  Output 900 ml  Net -900 ml   Filed Weights   07/28/23 1709 07/29/23 0956  Weight: 113.4 kg 122.1 kg    Examination:  General exam: Morbidly obese chronically ill female sitting up in bed, AAOx3 HEENT: Neck obese unable to assess JVD CVS: S1-S2, regular rhythm Lungs: Clear bilaterally Abdomen: Soft, nontender, bowel sounds present Extremities: Trace edema, tender points everywhere throughout neck back arms legs etc. Skin: No rashes on exposed skin  Psychiatry:  Mood & affect appropriate.     Data Reviewed:   CBC: Recent Labs  Lab 07/27/23 0955 07/28/23 1716  WBC 11.7* 10.7*  HGB 14.3 14.3  HCT 42.5 42.9  MCV 80.6 80.6  PLT 213 223   Basic Metabolic Panel: Recent Labs  Lab 07/27/23 0955 07/28/23 1716 07/29/23 0207  NA 139 139 137  K 3.1* 3.8 3.6  CL 104 105 104  CO2 19* 18* 20*  GLUCOSE 130* 280* 297*  BUN 10 12  12  CREATININE 1.34* 1.43* 1.45*  CALCIUM 9.6 9.8 9.7   GFR: Estimated Creatinine Clearance: 62.5 mL/min (A) (by C-G formula based on SCr of 1.45 mg/dL (H)). Liver Function Tests: Recent Labs  Lab 07/27/23 0955  AST 23  ALT 13  ALKPHOS 93  BILITOT 0.4  PROT 7.6  ALBUMIN 4.1   Recent Labs  Lab 07/27/23 0955  LIPASE 22   No results for input(s): "AMMONIA" in  the last 168 hours. Coagulation Profile: No results for input(s): "INR", "PROTIME" in the last 168 hours. Cardiac Enzymes: No results for input(s): "CKTOTAL", "CKMB", "CKMBINDEX", "TROPONINI" in the last 168 hours. BNP (last 3 results) No results for input(s): "PROBNP" in the last 8760 hours. HbA1C: Recent Labs    07/29/23 0207  HGBA1C 8.2*   CBG: Recent Labs  Lab 07/28/23 1719 07/28/23 2329 07/29/23 0351 07/29/23 0757  GLUCAP 265* 304* 278* 328*   Lipid Profile: No results for input(s): "CHOL", "HDL", "LDLCALC", "TRIG", "CHOLHDL", "LDLDIRECT" in the last 72 hours. Thyroid Function Tests: No results for input(s): "TSH", "T4TOTAL", "FREET4", "T3FREE", "THYROIDAB" in the last 72 hours. Anemia Panel: No results for input(s): "VITAMINB12", "FOLATE", "FERRITIN", "TIBC", "IRON", "RETICCTPCT" in the last 72 hours. Urine analysis:    Component Value Date/Time   COLORURINE YELLOW 07/27/2023 1047   APPEARANCEUR CLEAR 07/27/2023 1047   LABSPEC 1.012 07/27/2023 1047   PHURINE 7.0 07/27/2023 1047   GLUCOSEU NEGATIVE 07/27/2023 1047   HGBUR NEGATIVE 07/27/2023 1047   BILIRUBINUR NEGATIVE 07/27/2023 1047   BILIRUBINUR negative 12/29/2021 1205   BILIRUBINUR NEGATIVE 11/05/2019 1342   KETONESUR NEGATIVE 07/27/2023 1047   PROTEINUR NEGATIVE 07/27/2023 1047   UROBILINOGEN 1.0 12/29/2021 1205   UROBILINOGEN 0.2 11/12/2020 1148   NITRITE NEGATIVE 07/27/2023 1047   LEUKOCYTESUR NEGATIVE 07/27/2023 1047   Sepsis Labs: @LABRCNTIP (procalcitonin:4,lacticidven:4)  )No results found for this or any previous visit (from the past 240 hour(s)).   Radiology Studies: DG Chest 2 View  Result Date: 07/28/2023 CLINICAL DATA:  Chest pain and shortness of breath EXAM: CHEST - 2 VIEW COMPARISON:  Chest x-ray 07/07/2023 FINDINGS: The heart is enlarged. There central pulmonary vascular congestion. There is no focal lung consolidation, pleural effusion or pneumothorax. No acute fractures are seen.  IMPRESSION: Cardiomegaly with central pulmonary vascular congestion. Electronically Signed   By: Darliss Cheney M.D.   On: 07/28/2023 19:02   CT Head Wo Contrast  Result Date: 07/27/2023 CLINICAL DATA:  Headache, neuro deficit EXAM: CT HEAD WITHOUT CONTRAST TECHNIQUE: Contiguous axial images were obtained from the base of the skull through the vertex without intravenous contrast. RADIATION DOSE REDUCTION: This exam was performed according to the departmental dose-optimization program which includes automated exposure control, adjustment of the mA and/or kV according to patient size and/or use of iterative reconstruction technique. COMPARISON:  02/18/2023 FINDINGS: Brain: No evidence of acute infarction, hemorrhage, hydrocephalus, extra-axial collection or mass lesion/mass effect. Vascular: No hyperdense vessel or unexpected calcification. Skull: Normal. Negative for fracture or focal lesion. Sinuses/Orbits: Paranasal sinuses are clear. Unchanged proptosis bilaterally. Other: None. IMPRESSION: 1. No acute intracranial findings. 2. Unchanged proptosis bilaterally. Electronically Signed   By: Duanne Guess D.O.   On: 07/27/2023 14:34     Scheduled Meds:  furosemide  40 mg Intravenous Daily   hydrALAZINE  25 mg Oral TID   [START ON 07/30/2023] influenza vac split trivalent PF  0.5 mL Intramuscular Tomorrow-1000   insulin aspart  0-9 Units Subcutaneous Q4H   insulin glargine-yfgn  20 Units Subcutaneous Daily  losartan  100 mg Oral Daily   metoprolol succinate  100 mg Oral Daily   pantoprazole  40 mg Oral Daily   [START ON 07/30/2023] pneumococcal 20-valent conjugate vaccine  0.5 mL Intramuscular Tomorrow-1000   potassium chloride  40 mEq Oral Once   pregabalin  50 mg Oral BID   spironolactone  50 mg Oral Daily   Continuous Infusions:   LOS: 0 days    Time spent:    Zannie Cove, MD [facility]   07/29/2023, 10:25 AM

## 2023-07-29 NOTE — ED Notes (Signed)
Pt currently sitting on [facility]

## 2023-07-29 NOTE — ED Notes (Signed)
Pt was able to use BSC without assistance.

## 2023-07-29 NOTE — ED Notes (Signed)
Pt requesting IV Dilaudid vs PO d/t she feels it works better. RN notified admitting provider.

## 2023-07-29 NOTE — ED Notes (Signed)
Pt states she has 8/10 pain in her back, neck, abdomen, and bilateral legs that is sharp in nature. RN notified provider.   Pt states she is nauseous. RN provided PRN medication.

## 2023-07-29 NOTE — Plan of Care (Signed)
Problem: Fluid Volume: Goal: Hemodynamic stability will improve Outcome: Progressing   Problem: Clinical Measurements: Goal: Signs and symptoms of infection will decrease Outcome: Progressing   Problem: Respiratory: Goal: Ability to maintain adequate ventilation will improve Outcome: Progressing

## 2023-07-29 NOTE — Care Management Obs Status (Signed)
MEDICARE OBSERVATION STATUS NOTIFICATION   Patient Details  Name: Jill Shaw MRN: [id number] Date of Birth: 01/01/1976   Medicare Observation Status Notification Given:  Yes    Lawerance Sabal, RN 07/29/2023, 2:15 PM

## 2023-07-30 ENCOUNTER — Observation Stay (HOSPITAL_COMMUNITY): Payer: Medicare Other

## 2023-07-30 DIAGNOSIS — I878 Other specified disorders of veins: Secondary | ICD-10-CM | POA: Diagnosis not present

## 2023-07-30 DIAGNOSIS — I5033 Acute on chronic diastolic (congestive) heart failure: Secondary | ICD-10-CM | POA: Diagnosis not present

## 2023-07-30 DIAGNOSIS — R109 Unspecified abdominal pain: Secondary | ICD-10-CM | POA: Diagnosis not present

## 2023-07-30 LAB — COMPREHENSIVE METABOLIC PANEL
ALT: 11 U/L (ref 0–44)
AST: 13 U/L — ABNORMAL LOW (ref 15–41)
Albumin: 3.6 g/dL (ref 3.5–5.0)
Alkaline Phosphatase: 91 U/L (ref 38–126)
Anion gap: 10 (ref 5–15)
BUN: 16 mg/dL (ref 6–20)
CO2: 22 mmol/L (ref 22–32)
Calcium: 9.5 mg/dL (ref 8.9–10.3)
Chloride: 108 mmol/L (ref 98–111)
Creatinine, Ser: 1.81 mg/dL — ABNORMAL HIGH (ref 0.44–1.00)
GFR, Estimated: 35 mL/min — ABNORMAL LOW (ref >60)
Glucose, Bld: 263 mg/dL — ABNORMAL HIGH (ref 70–99)
Potassium: 4.1 mmol/L (ref 3.5–5.1)
Sodium: 140 mmol/L (ref 135–145)
Total Bilirubin: 0.7 mg/dL (ref 0.3–1.2)
Total Protein: 6.6 g/dL (ref 6.5–8.1)

## 2023-07-30 LAB — GLUCOSE, CAPILLARY
Glucose-Capillary: 266 mg/dL — ABNORMAL HIGH (ref 70–99)
Glucose-Capillary: 273 mg/dL — ABNORMAL HIGH (ref 70–99)
Glucose-Capillary: 278 mg/dL — ABNORMAL HIGH (ref 70–99)
Glucose-Capillary: 282 mg/dL — ABNORMAL HIGH (ref 70–99)
Glucose-Capillary: 314 mg/dL — ABNORMAL HIGH (ref 70–99)
Glucose-Capillary: 316 mg/dL — ABNORMAL HIGH (ref 70–99)

## 2023-07-30 LAB — CBC
HCT: 41.2 % (ref 36.0–46.0)
Hemoglobin: 13.6 g/dL (ref 12.0–15.0)
MCH: 26.8 pg (ref 26.0–34.0)
MCHC: 33 g/dL (ref 30.0–36.0)
MCV: 81.3 fL (ref 80.0–100.0)
Platelets: 203 10*3/uL (ref 150–400)
RBC: 5.07 MIL/uL (ref 3.87–5.11)
RDW: 17.8 % — ABNORMAL HIGH (ref 11.5–15.5)
WBC: 8.3 10*3/uL (ref 4.0–10.5)
nRBC: 0 % (ref 0.0–0.2)

## 2023-07-30 LAB — TSH: TSH: 1.813 u[IU]/mL (ref 0.350–4.500)

## 2023-07-30 MED ORDER — POLYETHYLENE GLYCOL 3350 17 G PO PACK
17.0000 g | PACK | Freq: Every day | ORAL | Status: DC
  Administered 2023-07-30 – 2023-07-31 (×2): 17 g via ORAL
  Filled 2023-07-30 (×2): qty 1

## 2023-07-30 MED ORDER — CYANOCOBALAMIN 1000 MCG/ML IJ SOLN
1000.0000 ug | Freq: Once | INTRAMUSCULAR | Status: AC
  Administered 2023-07-30: 1000 ug via INTRAMUSCULAR
  Filled 2023-07-30: qty 1

## 2023-07-30 MED ORDER — INSULIN GLARGINE-YFGN 100 UNIT/ML ~~LOC~~ SOLN
30.0000 [IU] | Freq: Every day | SUBCUTANEOUS | Status: DC
  Filled 2023-07-30: qty 0.3

## 2023-07-30 MED ORDER — INSULIN GLARGINE-YFGN 100 UNIT/ML ~~LOC~~ SOLN
10.0000 [IU] | Freq: Once | SUBCUTANEOUS | Status: AC
  Administered 2023-07-30: 10 [IU] via SUBCUTANEOUS
  Filled 2023-07-30: qty 0.1

## 2023-07-30 MED ORDER — INSULIN ASPART 100 UNIT/ML IJ SOLN
5.0000 [IU] | Freq: Three times a day (TID) | INTRAMUSCULAR | Status: DC
  Administered 2023-07-30 – 2023-07-31 (×2): 5 [IU] via SUBCUTANEOUS

## 2023-07-30 MED ORDER — INSULIN ASPART 100 UNIT/ML IJ SOLN
3.0000 [IU] | Freq: Three times a day (TID) | INTRAMUSCULAR | Status: DC
  Administered 2023-07-30: 3 [IU] via SUBCUTANEOUS

## 2023-07-30 NOTE — Progress Notes (Signed)
Mobility Specialist Progress Note:    07/30/23 1535  Mobility  Activity Ambulated with assistance in hallway  Level of Assistance Contact guard assist, steadying assist  Assistive Device Cane  Distance Ambulated (ft) 120 ft  Range of Motion/Exercises Active;All extremities  Activity Response Tolerated well  Mobility Referral Yes  $Mobility charge 1 Mobility  Mobility Specialist Start Time (ACUTE ONLY) 1525  Mobility Specialist Stop Time (ACUTE ONLY) 1532  Mobility Specialist Time Calculation (min) (ACUTE ONLY) 7 min   Pt received sitting EOB. Tolerated well, audible SOB while ambulating,  SpO2 95% on RA the entire session. Returned pt to room, c/o back pain. All needs met, call bell in reach.   Feliciana Rossetti Mobility Specialist Please contact via Special educational needs teacher or  Rehab office at [phone]

## 2023-07-30 NOTE — Plan of Care (Signed)

## 2023-07-30 NOTE — Inpatient Diabetes Management (Signed)
Inpatient Diabetes Program Recommendations  AACE/ADA: New Consensus Statement on Inpatient Glycemic Control (2015)  Target Ranges:  Prepandial:   less than 140 mg/dL      Peak postprandial:   less than 180 mg/dL (1-2 hours)      Critically ill patients:  140 - 180 mg/dL   Lab Results  Component Value Date   GLUCAP 278 (H) 07/30/2023   HGBA1C 8.2 (H) 07/29/2023    Latest Reference Range & Units 07/29/23 07:57 07/29/23 11:46 07/29/23 15:51 07/29/23 20:17 07/29/23 23:36 07/30/23 04:34 07/30/23 07:22  Glucose-Capillary 70 - 99 mg/dL 161 (H) 096 (H) 045 (H) 296 (H) 241 (H) 266 (H) 278 (H)  (H): Data is abnormally high  Diabetes history: DM2 Outpatient Diabetes medications: Toujeo 60 units daily, Humalog 50 units tid meal coverage Current orders for Inpatient glycemic control: Semglee 20 units daily, Novolog 3 units tid, Novolog 0-9 units q 4 hrs.  Inpatient Diabetes Program Recommendations:   Please consider: -Increase Semglee to 30 units daily  Thank you, Billy Fischer. Shabrea Weldin, RN, MSN, CDE  Diabetes Coordinator Inpatient Glycemic Control Team Team Pager [phone] (8am-5pm) 07/30/2023 9:46 AM

## 2023-07-30 NOTE — Progress Notes (Signed)
PROGRESS NOTE    Jill Shaw  WGN:[id number] DOB: 1975/12/07 DOA: 07/28/2023 PCP: Ivonne Andrew, NP  [name] w hypertension, insulin-dependent type 2 diabetes, diabetic gastroparesis, chronic gastritis, peripheral neuropathy, asthma/COPD, chronic HFpEF, abnormal uterine bleeding, class III obesity (BMI 42.91), CKD stage IIIa, fibromyalgia, chronic pain syndrome, anxiety, depression, GERD, hypertension, hyperlipidemia presenting with multiple complaints.  Primary complaint is diffuse pain all over which is reportedly causing her blood pressure to be high, ran out of oxycodone and is trying to establish care at a new pain medicine clinic.  Also reports some dyspnea on exertion -In the ED she was hypertensive blood blood pressure in the 200s, CBGs 290, creatinine 1.4, troponin 11, hemoglobin 14.3, WBC 10.7, serum hCG negative, UA not suggestive of infection, normal lipase and LFTs.  CT head negative for acute intracranial abnormality.  Chest x-ray showing cardiomegaly with central pulmonary vascular congestion.     Subjective: -Feels a little better, continues to have pain all over, has some left-sided abdominal discomfort  Assessment and Plan:  Acute on chronic HFpEF -Last echo 8/22 with a EF > 70%, mild LVH, indeterminate diastolic function -Mild symptoms of dyspnea on exertion, likely worsened by accelerated hypertension  -Volume status has improved, creatinine is higher, hold Lasix and losartan today -Discussed diet and lifestyle modification, dietitian consult, add SGLT2i if creatinine trends down   Hypertensive urgency -Improving, continue Aldactone, hydralazine, metoprolol -Lasix on hold today  Chronic pain, generalized -Narcotic dependence -Resumed home regimen of oxycodone -Reattempt trial of Lyrica for neuropathy -ESR and CRP are normal, B12 is low will start replacement  B12 deficiency Replace   Chest pain -Chronic, atypical, ACS ruled out troponin and D-dimer  negative   Insulin-dependent type 2 diabetes -Uncontrolled with hyperglycemia, last A1c 7.0 in March 2024. -Increase Semglee and meal coverage  Asthma/COPD Stable, no wheezing.  Xopenex as needed.   GERD Continue PPI.   DVT prophylaxis: Lovenox Code Status: Full Code (discussed with the patient)  Family Communication: Mother at side Disposition Plan: Home tomorrow if stable  Consultants:    Procedures:   Antimicrobials:    Objective: Vitals:   07/30/23 0437 07/30/23 0637 07/30/23 0708 07/30/23 0715  BP: (!) 156/90 127/74  135/80  Pulse: 77 74  75  Resp: 18 16  18   Temp: 98 F (36.7 C) 98.4 F (36.9 C)  98.7 F (37.1 C)  TempSrc: Oral Oral  Oral  SpO2: 94% 99%  99%  Weight:   121.3 kg   Height:        Intake/Output Summary (Last 24 hours) at 07/30/2023 1253 Last data filed at 07/30/2023 0827 Gross per 24 hour  Intake 687 ml  Output 800 ml  Net -113 ml   Filed Weights   07/28/23 1709 07/29/23 0956 07/30/23 0708  Weight: 113.4 kg 122.1 kg 121.3 kg    Examination:  General exam: Morbidly obese chronically ill female sitting up in bed, AAOx3 HEENT: Neck obese unable to assess JVD CVS: S1-S2, regular rhythm Lungs: Clear bilaterally Abdomen: Soft, nontender, bowel sounds present Extremities: Trace edema, tender points everywhere throughout neck back arms legs etc. Skin: No rashes on exposed skin  Psychiatry:  Mood & affect appropriate.     Data Reviewed:   CBC: Recent Labs  Lab 07/27/23 0955 07/28/23 1716 07/30/23 0344  WBC 11.7* 10.7* 8.3  HGB 14.3 14.3 13.6  HCT 42.5 42.9 41.2  MCV 80.6 80.6 81.3  PLT 213 223 203   Basic Metabolic Panel: Recent Labs  Lab 07/27/23 0955 07/28/23 1716 07/29/23 0207 07/30/23 0344  NA 139 139 137 140  K 3.1* 3.8 3.6 4.1  CL 104 105 104 108  CO2 19* 18* 20* 22  GLUCOSE 130* 280* 297* 263*  BUN 10 12 12 16   CREATININE 1.34* 1.43* 1.45* 1.81*  CALCIUM 9.6 9.8 9.7 9.5   GFR: Estimated Creatinine  Clearance: 49.8 mL/min (A) (by C-G formula based on SCr of 1.81 mg/dL (H)). Liver Function Tests: Recent Labs  Lab 07/27/23 0955 07/30/23 0344  AST 23 13*  ALT 13 11  ALKPHOS 93 91  BILITOT 0.4 0.7  PROT 7.6 6.6  ALBUMIN 4.1 3.6   Recent Labs  Lab 07/27/23 0955  LIPASE 22   No results for input(s): "AMMONIA" in the last 168 hours. Coagulation Profile: No results for input(s): "INR", "PROTIME" in the last 168 hours. Cardiac Enzymes: No results for input(s): "CKTOTAL", "CKMB", "CKMBINDEX", "TROPONINI" in the last 168 hours. BNP (last 3 results) No results for input(s): "PROBNP" in the last 8760 hours. HbA1C: Recent Labs    07/29/23 0207  HGBA1C 8.2*   CBG: Recent Labs  Lab 07/29/23 2017 07/29/23 2336 07/30/23 0434 07/30/23 0722 07/30/23 1206  GLUCAP 296* 241* 266* 278* 314*   Lipid Profile: No results for input(s): "CHOL", "HDL", "LDLCALC", "TRIG", "CHOLHDL", "LDLDIRECT" in the last 72 hours. Thyroid Function Tests: Recent Labs    07/30/23 0344  TSH 1.813   Anemia Panel: Recent Labs    07/29/23 0207  VITAMINB12 162*   Urine analysis:    Component Value Date/Time   COLORURINE YELLOW 07/27/2023 1047   APPEARANCEUR CLEAR 07/27/2023 1047   LABSPEC 1.012 07/27/2023 1047   PHURINE 7.0 07/27/2023 1047   GLUCOSEU NEGATIVE 07/27/2023 1047   HGBUR NEGATIVE 07/27/2023 1047   BILIRUBINUR NEGATIVE 07/27/2023 1047   BILIRUBINUR negative 12/29/2021 1205   BILIRUBINUR NEGATIVE 11/05/2019 1342   KETONESUR NEGATIVE 07/27/2023 1047   PROTEINUR NEGATIVE 07/27/2023 1047   UROBILINOGEN 1.0 12/29/2021 1205   UROBILINOGEN 0.2 11/12/2020 1148   NITRITE NEGATIVE 07/27/2023 1047   LEUKOCYTESUR NEGATIVE 07/27/2023 1047   Sepsis Labs: @LABRCNTIP (procalcitonin:4,lacticidven:4)  )No results found for this or any previous visit (from the past 240 hour(s)).   Radiology Studies: DG Chest 2 View  Result Date: 07/28/2023 CLINICAL DATA:  Chest pain and shortness of breath  EXAM: CHEST - 2 VIEW COMPARISON:  Chest x-ray 07/07/2023 FINDINGS: The heart is enlarged. There central pulmonary vascular congestion. There is no focal lung consolidation, pleural effusion or pneumothorax. No acute fractures are seen. IMPRESSION: Cardiomegaly with central pulmonary vascular congestion. Electronically Signed   By: Darliss Cheney M.D.   On: 07/28/2023 19:02     Scheduled Meds:  enoxaparin (LOVENOX) injection  0.5 mg/kg Subcutaneous Q24H   hydrALAZINE  25 mg Oral TID   insulin aspart  0-9 Units Subcutaneous Q4H   insulin aspart  3 Units Subcutaneous TID WC   insulin glargine-yfgn  10 Units Subcutaneous Once   [START ON 07/31/2023] insulin glargine-yfgn  30 Units Subcutaneous Daily   melatonin  3 mg Oral QHS   metoprolol succinate  100 mg Oral Daily   nicotine  14 mg Transdermal Daily   pantoprazole  40 mg Oral Daily   polyethylene glycol  17 g Oral Daily   pregabalin  50 mg Oral BID   senna-docusate  1 tablet Oral BID   spironolactone  50 mg Oral Daily   Continuous Infusions:   LOS: 0 days    Time spent:  Zannie Cove, MD [facility]   07/30/2023, 12:53 PM

## 2023-07-30 NOTE — Progress Notes (Signed)
Chaplain responded to a consult request for Advance Directive education. Jill Shaw was in her hospital bed and consented to receiving education. She shared that she is beginning to feel better.  Chaplain provided the Advance Directive packet as well as education on Advance Directives-documents an individual completes to communicate their health care directions in advance of a time when they may need them. Chaplain informed pt the documents which may be completed here in the hospital are the Living Will and Health Care Power of [place].   Chaplain informed that the Health Care Power of [place] is a legal document in which an individual names another person, their Health Care Agent, to make health care decisions when the individual is not able to make them for themselves. The Health Care Agent's function can be temporary or permanent depending on the pt's ability to make and communicate those decisions independently. Chaplain informed pt in the absence of a Health Care Power of [place], the state of [place] directs health care providers to look to the following individuals in the order listed: legal guardian; an attorney?in?fact under a general power of attorney (POA) if that POA includes the right to make health care decisions; a husband or wife; a majority of parents and adult children; a majority of adult brothers and sisters; or an individual who has an established relationship with you, who is acting in good faith and who can convey your wishes.  If none of these person are available or willing to make medical decisions on a patient's behalf, the law allows the patient's doctor to make decisions for them as long as another doctor agrees with those decisions.  Chaplain also informed the patient that the Health Care agent has no decision-making authority over any affairs other than those related to his or her medical care.   The chaplain further educated the pt that a Living Will is a legal  document that allows an individual to state his or her desire not to receive life-prolonging measures in the event that they have a condition that is incurable and will result in their death in a short period of time; they are unconscious, and doctors are confident that they will not regain consciousness; and/or they have advanced dementia or other substantial and irreversible loss of mental function. The chaplain informed pt that life-prolonging measures are medical treatments that would only serve to postpone death, including breathing machines, kidney dialysis, antibiotics, artificial nutrition and hydration (tube feeding), and similar forms of treatment and that if an individual is able to express their wishes, they may also make them known without the use of a Living Will, but in the event that an individual is not able to express their wishes themselves, a Living Will allows medical providers and the pt's family and friends ensure that they are not making decisions on the pt's behalf, but rather serving as the pt's voice to convey decisions the pt has already made.   The patient is aware that the decision to create an advance directive is theirs alone and they may chose not to complete the documents or may chose to complete one portion or both.  The patient was informed that they can revoke the documents at any time by striking through them and writing void or by completing new documents, but that it is also advisable that the individual verbally notify interested parties that their wishes have changed.  They are also aware that the document must be signed in the presence of a notary  public and two witnesses and that this can be done while the patient is still admitted to the hospital or after discharge in the community. If they decide to complete Advance Directives after being discharged from the hospital, they have been advised to notify all interested parties and to provide those documents to their  physicians and loved ones in addition to bringing them to the hospital in the event of another hospitalization.   The chaplain informed the pt that if they desire to proceed with completing Advance Directive Documentation while they are still admitted, notary services are typically available at [facility] between the hours of 1:00 and 3:30 Monday-Thursday.    When the patient is ready to have these documents completed, the patient should request that their nurse place a spiritual care consult and indicate that the patient is ready to have their advance directives notarized so that arrangements for witnesses and notary public can be made.  Please page spiritual care if the patient desires further education or has questions.     Jill Shaw. Jill Shaw, M.Div. [facility] Chaplain Pager [phone] Office [phone]

## 2023-07-30 NOTE — Progress Notes (Signed)
Heart Failure Navigator Progress Note  Assessed for Heart & Vascular TOC clinic readiness.  Patient does not meet criteria due to [facility] appointment already scheduled per Dr. Jomarie Longs.  Navigator will sign off at this time.  Roxy Horseman, RN, BSN [facility] Heart Failure Navigator Secure Chat Only

## 2023-07-31 ENCOUNTER — Other Ambulatory Visit (HOSPITAL_COMMUNITY): Payer: Self-pay

## 2023-07-31 DIAGNOSIS — I5033 Acute on chronic diastolic (congestive) heart failure: Secondary | ICD-10-CM | POA: Diagnosis not present

## 2023-07-31 LAB — CBC
HCT: 39.5 % (ref 36.0–46.0)
Hemoglobin: 13.1 g/dL (ref 12.0–15.0)
MCH: 27 pg (ref 26.0–34.0)
MCHC: 33.2 g/dL (ref 30.0–36.0)
MCV: 81.3 fL (ref 80.0–100.0)
Platelets: 176 10*3/uL (ref 150–400)
RBC: 4.86 MIL/uL (ref 3.87–5.11)
RDW: 17.5 % — ABNORMAL HIGH (ref 11.5–15.5)
WBC: 8.3 10*3/uL (ref 4.0–10.5)
nRBC: 0 % (ref 0.0–0.2)

## 2023-07-31 LAB — COMPREHENSIVE METABOLIC PANEL
ALT: 10 U/L (ref 0–44)
AST: 13 U/L — ABNORMAL LOW (ref 15–41)
Albumin: 3.5 g/dL (ref 3.5–5.0)
Alkaline Phosphatase: 82 U/L (ref 38–126)
Anion gap: 12 (ref 5–15)
BUN: 17 mg/dL (ref 6–20)
CO2: 21 mmol/L — ABNORMAL LOW (ref 22–32)
Calcium: 9.4 mg/dL (ref 8.9–10.3)
Chloride: 105 mmol/L (ref 98–111)
Creatinine, Ser: 1.32 mg/dL — ABNORMAL HIGH (ref 0.44–1.00)
GFR, Estimated: 50 mL/min — ABNORMAL LOW (ref >60)
Glucose, Bld: 227 mg/dL — ABNORMAL HIGH (ref 70–99)
Potassium: 4.1 mmol/L (ref 3.5–5.1)
Sodium: 138 mmol/L (ref 135–145)
Total Bilirubin: 0.5 mg/dL (ref 0.3–1.2)
Total Protein: 6.5 g/dL (ref 6.5–8.1)

## 2023-07-31 LAB — HIGH SENSITIVITY CRP: CRP, High Sensitivity: 3.63 mg/L — ABNORMAL HIGH (ref 0.00–3.00)

## 2023-07-31 LAB — GLUCOSE, CAPILLARY
Glucose-Capillary: 215 mg/dL — ABNORMAL HIGH (ref 70–99)
Glucose-Capillary: 276 mg/dL — ABNORMAL HIGH (ref 70–99)

## 2023-07-31 MED ORDER — PREGABALIN 50 MG PO CAPS
50.0000 mg | ORAL_CAPSULE | Freq: Two times a day (BID) | ORAL | 0 refills | Status: DC
  Filled 2023-07-31: qty 60, 30d supply, fill #0

## 2023-07-31 MED ORDER — EMPAGLIFLOZIN 10 MG PO TABS
10.0000 mg | ORAL_TABLET | Freq: Every day | ORAL | Status: DC
  Administered 2023-07-31: 10 mg via ORAL
  Filled 2023-07-31: qty 1

## 2023-07-31 MED ORDER — SPIRONOLACTONE 25 MG PO TABS
25.0000 mg | ORAL_TABLET | Freq: Every day | ORAL | 0 refills | Status: DC
  Filled 2023-07-31: qty 30, 30d supply, fill #0

## 2023-07-31 MED ORDER — INSULIN GLARGINE-YFGN 100 UNIT/ML ~~LOC~~ SOLN
35.0000 [IU] | Freq: Every day | SUBCUTANEOUS | Status: DC
  Filled 2023-07-31: qty 0.35

## 2023-07-31 MED ORDER — METOPROLOL SUCCINATE ER 100 MG PO TB24
100.0000 mg | ORAL_TABLET | Freq: Every day | ORAL | 0 refills | Status: DC
  Filled 2023-07-31: qty 30, 30d supply, fill #0

## 2023-07-31 MED ORDER — EMPAGLIFLOZIN 10 MG PO TABS
10.0000 mg | ORAL_TABLET | Freq: Every day | ORAL | 0 refills | Status: DC
  Filled 2023-07-31: qty 30, 30d supply, fill #0

## 2023-07-31 MED ORDER — FUROSEMIDE 40 MG PO TABS
40.0000 mg | ORAL_TABLET | Freq: Every day | ORAL | 0 refills | Status: DC
  Filled 2023-07-31 – 2023-10-11 (×3): qty 30, 30d supply, fill #0

## 2023-07-31 MED ORDER — OXYCODONE HCL 10 MG PO TABS
10.0000 mg | ORAL_TABLET | Freq: Four times a day (QID) | ORAL | 0 refills | Status: DC | PRN
  Filled 2023-07-31: qty 45, 12d supply, fill #0

## 2023-07-31 MED ORDER — TOUJEO MAX SOLOSTAR 300 UNIT/ML ~~LOC~~ SOPN: 40.0000 [IU] | PEN_INJECTOR | Freq: Every morning | SUBCUTANEOUS | Status: DC

## 2023-07-31 MED ORDER — MELATONIN 3 MG PO TABS
3.0000 mg | ORAL_TABLET | Freq: Every day | ORAL | 0 refills | Status: DC
  Filled 2023-07-31: qty 30, 30d supply, fill #0

## 2023-07-31 MED ORDER — HYDRALAZINE HCL 25 MG PO TABS
25.0000 mg | ORAL_TABLET | Freq: Three times a day (TID) | ORAL | 0 refills | Status: DC
  Filled 2023-07-31: qty 90, 30d supply, fill #0

## 2023-07-31 NOTE — Progress Notes (Signed)
Pt is making slow movements. she is getting dressed now. but very slow movements. now she is asked about her pain doctors appt. And her food Tele and IV removed. Discharge instructions given.

## 2023-07-31 NOTE — Progress Notes (Signed)
Patient cared for by [facility]; see documentation.

## 2023-07-31 NOTE — Discharge Summary (Signed)
Physician Discharge Summary  KRISTALEE LIMA ZOX:[id number] DOB: 15-Jan-1976 DOA: 07/28/2023  PCP: Ivonne Andrew, NP  Admit date: 07/28/2023 Discharge date: 07/31/2023  Time spent: 35 minutes minutes  Recommendations for Outpatient Follow-up:   PCP in 1 week, please check BMP at follow-up Please continue weekly B12 shots for 5 weeks followed by 1000 mcg p.o. daily   Discharge Diagnoses:  Principal Problem:   Acute on chronic heart failure with preserved ejection fraction (HFpEF) (HCC) Active Problems:   Chronic abdominal pain (5th area of Pain)   Chronic pain syndrome   Chest pain   Type 2 diabetes mellitus (HCC)   Hypertensive urgency   Discharge Condition: Improved  Diet recommendation: Low-sodium, heart healthy, diabetic, encouraged elimination diet  Filed Weights   07/29/23 0956 07/30/23 0708 07/31/23 0138  Weight: 122.1 kg 121.3 kg 123.5 kg    History of present illness:  [name] w hypertension, insulin-dependent type 2 diabetes, diabetic gastroparesis, chronic gastritis, peripheral neuropathy, asthma/COPD, chronic HFpEF, abnormal uterine bleeding, class III obesity (BMI 42.91), CKD stage IIIa, fibromyalgia, chronic pain syndrome, anxiety, depression, GERD, hypertension, hyperlipidemia presenting with multiple complaints.  Primary complaint is diffuse pain all over which is reportedly causing her blood pressure to be high, ran out of oxycodone and is trying to establish care at a new pain medicine clinic.  Also reports some dyspnea on exertion -In the ED she was hypertensive blood blood pressure in the 200s, CBGs 290, creatinine 1.4, troponin 11, hemoglobin 14.3, WBC 10.7, serum hCG negative, UA not suggestive of infection, normal lipase and LFTs.  CT head negative for acute intracranial abnormality.  Chest x-ray showing cardiomegaly with central pulmonary vascular congestion.   Hospital Course:  Acute on chronic HFpEF -Last echo 8/22 with a EF > 70%, mild LVH,  indeterminate diastolic function -Mild symptoms of dyspnea on exertion, likely worsened by accelerated hypertension  -Volume status has improved, creatinine trended up slightly yesterday but now improving, restarted Lasix, added Jardiance as well -Losartan and HCTZ discontinued- -Follow-up with PCP in 1 week, please check BMP at follow-up, strongly recommended significant diet and lifestyle modification, weight loss  Hypertensive urgency -Improving, continue Aldactone, hydralazine, metoprolol -Lasix resumed   Chronic pain, generalized -Narcotic dependence -Resumed home regimen of oxycodone -Reattempt trial of Lyrica for neuropathy -ESR and CRP are normal, B12 is low started replacement -Recommended weight loss exercise and diet/lifestyle modification   B12 deficiency Replaced, started weekly IM shots, recommend weekly for 5 weeks followed by 1000 mcg oral vitamin B-12 replacement daily   Chest pain -Chronic, atypical, ACS ruled out troponin and D-dimer negative   Insulin-dependent type 2 diabetes -Uncontrolled with hyperglycemia, last A1c 7.0 in March 2024. -Increased Semglee and meal coverage   Asthma/COPD Stable, no wheezing.  Xopenex as needed.   GERD Continue PPI.    Discharge Exam: Vitals:   07/31/23 0400 07/31/23 0801  BP: (!) 143/77 (!) 126/39  Pulse: 75 81  Resp: 18 19  Temp: 98 F (36.7 C) 99 F (37.2 C)  SpO2: 98%    General exam: Morbidly obese chronically ill female sitting up in bed, AAOx3 HEENT: Neck obese unable to assess JVD CVS: S1-S2, regular rhythm Lungs: Clear bilaterally Abdomen: Soft, nontender, bowel sounds present Extremities: Trace edema, tender points everywhere throughout neck back arms legs etc. Skin: No rashes on exposed skin  Psychiatry:  Mood & affect appropriate.   Discharge Instructions   Discharge Instructions     Diet - low sodium heart healthy   Complete  by: As directed    Diet Carb Modified   Complete by: As directed     Increase activity slowly   Complete by: As directed       Allergies as of 07/31/2023       Reactions   Elavil [amitriptyline] Other (See Comments)   Coma   Orudis [ketoprofen] Nausea And Vomiting   Desyrel [trazodone] Nausea And Vomiting   Tylenol [acetaminophen] Nausea And Vomiting   Aspirin Nausea Only   Motrin [ibuprofen] Nausea And Vomiting   Naprosyn [naproxen] Nausea And Vomiting   Neurontin [gabapentin] Nausea And Vomiting, Other (See Comments)   upset stomach   Sulfa Antibiotics Nausea And Vomiting   Ultram [tramadol] Nausea And Vomiting, Other (See Comments)   stomach upset   Victoza [liraglutide] Nausea And Vomiting   Zegerid [omeprazole-sodium Bicarbonate] Nausea And Vomiting        Medication List     STOP taking these medications    hydrochlorothiazide 12.5 MG capsule Commonly known as: MICROZIDE   losartan 100 MG tablet Commonly known as: COZAAR   Simethicone 180 MG Caps   sucralfate 1 g tablet Commonly known as: Carafate       TAKE these medications    Accu-Chek Guide test strip Generic drug: glucose blood USE TO TEST BLOOD GLUCOSE AS DIRECTED UP TO 4 TIMES DAILY   Accu-Chek Softclix Lancets lancets USE TO TEST AS DIRECTED UP TO FOUR TIMES DAILY.   albuterol 108 (90 Base) MCG/ACT inhaler Commonly known as: VENTOLIN HFA Inhale 2 puffs into the lungs every 6 (six) hours as needed for wheezing or shortness of breath.   B-D ULTRAFINE III SHORT PEN 31G X 8 MM Misc Generic drug: Insulin Pen Needle USE AS DIRECTED EVERY MORNING, NOON, EVENING, AND AT BEDTIME   Blood Pressure Kit Kit 1 each by Does not apply route daily.   Dexlansoprazole 30 MG capsule DR TAKE 1 CAPSULE (30 MG TOTAL) BY MOUTH DAILY. NEEDS OFFICE VISIT FOR ADDITIONAL REFILLS What changed:  when to take this reasons to take this   dicyclomine 20 MG tablet Commonly known as: BENTYL Take 1 tablet (20 mg total) by mouth 2 (two) times daily. What changed:  when to take  this reasons to take this   FreeStyle Libre 3 Sensor Misc Place 1 sensor on the skin every 14 days. Use to check glucose continuously   furosemide 40 MG tablet Commonly known as: LASIX Take 1 tablet (40 mg total) by mouth daily. What changed: when to take this Notes to patient: Take to remove excess fluids    HumaLOG KwikPen 200 UNIT/ML KwikPen Generic drug: insulin lispro Inject 50 Units into the skin 3 (three) times daily before meals.   hydrALAZINE 25 MG tablet Commonly known as: APRESOLINE Take 1 tablet (25 mg total) by mouth 3 (three) times daily. Notes to patient: Take for blood pressure   Jardiance 10 MG Tabs tablet Generic drug: empagliflozin Take 1 tablet (10 mg total) by mouth daily. Start taking on: August 01, 2023 Notes to patient:  Take for your heart   melatonin 3 MG Tabs tablet Take 1 tablet (3 mg total) by mouth at bedtime.   metoCLOPramide 10 MG tablet Commonly known as: REGLAN Take 1 tablet (10 mg total) by mouth every 8 (eight) hours as needed for nausea.   metoprolol succinate 100 MG 24 hr tablet Commonly known as: TOPROL-XL Take 1 tablet (100 mg total) by mouth daily. Take with or immediately following a meal.  nicotine 21 mg/24hr patch Commonly known as: NICODERM CQ - dosed in mg/24 hours Place 1 patch (21 mg total) onto the skin daily.   nitroGLYCERIN 0.4 MG SL tablet Commonly known as: Nitrostat Place 1 tablet (0.4 mg total) under the tongue every 5 (five) minutes as needed for chest pain. Additional refills to be filled by PCP, patient aware   norethindrone 5 MG tablet Commonly known as: AYGESTIN Take 1 tablet (5 mg total) by mouth 3 (three) times daily.   ondansetron 4 MG tablet Commonly known as: Zofran Take 1 tablet (4 mg total) by mouth every 8 (eight) hours as needed for nausea or vomiting.   Oxycodone HCl 10 MG Tabs Take 1 tablet (10 mg total) by mouth every 6 (six) hours as needed for severe pain (pain score 7-10) or moderate  pain (pain score 4-6).   Pimozide 1 MG Tabs Take 1 mg by mouth daily as needed (feeling of facial itching).   potassium chloride SA 20 MEQ tablet Commonly known as: KLOR-CON M Take 1 tablet (20 mEq total) by mouth daily.   pregabalin 50 MG capsule Commonly known as: LYRICA Take 1 capsule (50 mg total) by mouth 2 (two) times daily. Notes to patient: For pain may cause drowsiness    rosuvastatin 20 MG tablet Commonly known as: CRESTOR TAKE 1 TABLET(20 MG) BY MOUTH DAILY   spironolactone 25 MG tablet Commonly known as: ALDACTONE Take 1 tablet (25 mg total) by mouth daily. What changed:  medication strength See the new instructions.   Symbicort 160-4.5 MCG/ACT inhaler Generic drug: budesonide-formoterol INHALE 2 PUFFS INTO THE LUNGS TWICE DAILY What changed:  when to take this reasons to take this Notes to patient: Rinse and gurgle mouth after use    tiZANidine 4 MG tablet Commonly known as: Zanaflex Take 1 tablet (4 mg total) by mouth every 8 (eight) hours as needed for muscle spasms.   topiramate 25 MG tablet Commonly known as: Topamax Take 1 tablet (25 mg total) by mouth at bedtime.   Toujeo Max SoloStar 300 UNIT/ML Solostar Pen Generic drug: insulin glargine (2 Unit Dial) Inject 40 Units into the skin in the morning. What changed: how much to take       Allergies  Allergen Reactions   Elavil [Amitriptyline] Other (See Comments)    Coma   Orudis [Ketoprofen] Nausea And Vomiting   Desyrel [Trazodone] Nausea And Vomiting   Tylenol [Acetaminophen] Nausea And Vomiting   Aspirin Nausea Only   Motrin [Ibuprofen] Nausea And Vomiting   Naprosyn [Naproxen] Nausea And Vomiting   Neurontin [Gabapentin] Nausea And Vomiting and Other (See Comments)    upset stomach   Sulfa Antibiotics Nausea And Vomiting   Ultram [Tramadol] Nausea And Vomiting and Other (See Comments)    stomach upset   Victoza [Liraglutide] Nausea And Vomiting   Zegerid [Omeprazole-Sodium  Bicarbonate] Nausea And Vomiting    Follow-up Information     Ivonne Andrew, NP. Go on 08/10/2023.   Specialties: Pulmonary Disease, Endocrinology Why: @11 :20am Contact information: [address] [place] [address] [phone]                  The results of significant diagnostics from this hospitalization (including imaging, microbiology, ancillary and laboratory) are listed below for reference.    Significant Diagnostic Studies: DG Abd 2 Views  Result Date: 07/30/2023 CLINICAL DATA:  Left-sided abdominal pain. EXAM: ABDOMEN - 2 VIEW COMPARISON:  March 23, 2021. FINDINGS: No abnormal bowel  dilatation is noted. Phleboliths are noted in the pelvis. IUD is noted in pelvis. Moderate amount of stool seen throughout the colon. IMPRESSION: Moderate stool burden.  No abnormal bowel dilatation Electronically Signed   By: Lupita Raider M.D.   On: 07/30/2023 14:44   DG Chest 2 View  Result Date: 07/28/2023 CLINICAL DATA:  Chest pain and shortness of breath EXAM: CHEST - 2 VIEW COMPARISON:  Chest x-ray 07/07/2023 FINDINGS: The heart is enlarged. There central pulmonary vascular congestion. There is no focal lung consolidation, pleural effusion or pneumothorax. No acute fractures are seen. IMPRESSION: Cardiomegaly with central pulmonary vascular congestion. Electronically Signed   By: Darliss Cheney M.D.   On: 07/28/2023 19:02   CT Head Wo Contrast  Result Date: 07/27/2023 CLINICAL DATA:  Headache, neuro deficit EXAM: CT HEAD WITHOUT CONTRAST TECHNIQUE: Contiguous axial images were obtained from the base of the skull through the vertex without intravenous contrast. RADIATION DOSE REDUCTION: This exam was performed according to the departmental dose-optimization program which includes automated exposure control, adjustment of the mA and/or kV according to patient size and/or use of iterative reconstruction technique. COMPARISON:  02/18/2023 FINDINGS: Brain: No evidence of  acute infarction, hemorrhage, hydrocephalus, extra-axial collection or mass lesion/mass effect. Vascular: No hyperdense vessel or unexpected calcification. Skull: Normal. Negative for fracture or focal lesion. Sinuses/Orbits: Paranasal sinuses are clear. Unchanged proptosis bilaterally. Other: None. IMPRESSION: 1. No acute intracranial findings. 2. Unchanged proptosis bilaterally. Electronically Signed   By: Duanne Guess D.O.   On: 07/27/2023 14:34   DG Chest Portable 1 View  Result Date: 07/07/2023 CLINICAL DATA:  Chest pain and headache. EXAM: PORTABLE CHEST 1 VIEW COMPARISON:  June 24, 2023 FINDINGS: The cardiac silhouette is mildly enlarged and unchanged in size. Very mild bibasilar linear atelectasis is seen. There is no evidence of an acute infiltrate, pleural effusion or pneumothorax. The visualized skeletal structures are unremarkable. IMPRESSION: Stable cardiomegaly with very mild bibasilar linear atelectasis. Electronically Signed   By: Aram Candela M.D.   On: 07/07/2023 01:49    Microbiology: No results found for this or any previous visit (from the past 240 hour(s)).   Labs: Basic Metabolic Panel: Recent Labs  Lab 07/27/23 0955 07/28/23 1716 07/29/23 0207 07/30/23 0344 07/31/23 0416  NA 139 139 137 140 138  K 3.1* 3.8 3.6 4.1 4.1  CL 104 105 104 108 105  CO2 19* 18* 20* 22 21*  GLUCOSE 130* 280* 297* 263* 227*  BUN 10 12 12 16 17   CREATININE 1.34* 1.43* 1.45* 1.81* 1.32*  CALCIUM 9.6 9.8 9.7 9.5 9.4   Liver Function Tests: Recent Labs  Lab 07/27/23 0955 07/30/23 0344 07/31/23 0416  AST 23 13* 13*  ALT 13 11 10   ALKPHOS 93 91 82  BILITOT 0.4 0.7 0.5  PROT 7.6 6.6 6.5  ALBUMIN 4.1 3.6 3.5   Recent Labs  Lab 07/27/23 0955  LIPASE 22   No results for input(s): "AMMONIA" in the last 168 hours. CBC: Recent Labs  Lab 07/27/23 0955 07/28/23 1716 07/30/23 0344 07/31/23 0416  WBC 11.7* 10.7* 8.3 8.3  HGB 14.3 14.3 13.6 13.1  HCT 42.5 42.9 41.2  39.5  MCV 80.6 80.6 81.3 81.3  PLT 213 223 203 176   Cardiac Enzymes: No results for input(s): "CKTOTAL", "CKMB", "CKMBINDEX", "TROPONINI" in the last 168 hours. BNP: BNP (last 3 results) Recent Labs    09/18/22 1630 06/23/23 2359 07/29/23 0207  BNP 129.3* 196.5* 23.0    ProBNP (last 3 results)  No results for input(s): "PROBNP" in the last 8760 hours.  CBG: Recent Labs  Lab 07/30/23 1558 07/30/23 2032 07/30/23 2357 07/31/23 0357 07/31/23 0757  GLUCAP 316* 282* 273* 215* 276*       Signed:  Zannie Cove MD.  [facility] 07/31/2023, 12:51 PM

## 2023-07-31 NOTE — Progress Notes (Signed)
Mobility Specialist Progress Note:    07/31/23 1152  Mobility  Activity Ambulated with assistance in hallway  Level of Assistance Contact guard assist, steadying assist  Assistive Device Cane  Distance Ambulated (ft) 100 ft  Range of Motion/Exercises Active;All extremities  Activity Response Tolerated well  Mobility Referral Yes  $Mobility charge 1 Mobility  Mobility Specialist Start Time (ACUTE ONLY) 1130  Mobility Specialist Stop Time (ACUTE ONLY) 1135  Mobility Specialist Time Calculation (min) (ACUTE ONLY) 5 min   Pt received sitting EOB, agreeable to mobility session. Tolerated well, asx throughout. Returned pt to room, all needs met.   Feliciana Rossetti Mobility Specialist Please contact via Special educational needs teacher or  Rehab office at [phone]

## 2023-07-31 NOTE — TOC Transition Note (Signed)
Transition of Care [facility]) - CM/SW Discharge Note   Patient Details  Name: Jill Shaw MRN: [id number] Date of Birth: [birth year]  Transition of Care [facility] CM/SW Contact:  Leone Haven, RN Phone Number: 07/31/2023, 10:06 AM   Clinical Narrative:    For dc today, mom will transport her home.  Patient states her mom is her caregiver and she is trying to become permanent caregiver thru [employer] services which they have been working on.    Final next level of care: Home/Self Care Barriers to Discharge: No Barriers Identified   Patient Goals and CMS Choice   Choice offered to / list presented to : NA  Discharge Placement                         Discharge Plan and Services Additional resources added to the After Visit Summary for   In-house Referral: NA Discharge Planning Services: CM Consult Post Acute Care Choice: NA          DME Arranged: N/A DME Agency: NA       HH Arranged: NA          Social Determinants of Health (SDOH) Interventions SDOH Screenings   Food Insecurity: No Food Insecurity (07/29/2023)  Housing: Low Risk  (07/29/2023)  Transportation Needs: No Transportation Needs (07/29/2023)  Utilities: Not At Risk (07/29/2023)  Alcohol Screen: Low Risk  (03/18/2023)  Depression (PHQ2-9): Low Risk  (07/26/2023)  Financial Resource Strain: Low Risk  (03/18/2023)  Physical Activity: Inactive (03/18/2023)  Social Connections: Moderately Isolated (03/18/2023)  Stress: Stress Concern Present (03/18/2023)  Tobacco Use: High Risk (07/29/2023)     Readmission Risk Interventions    03/20/2023   12:47 PM 01/12/2021   10:27 AM  Readmission Risk Prevention Plan  Transportation Screening Complete Complete  Medication Review (RN Care Manager) Referral to Pharmacy Complete  PCP or Specialist appointment within 3-5 days of discharge  Complete  HRI or Home Care Consult Complete Complete  SW Recovery Care/Counseling Consult Complete Complete  Palliative Care  Screening Not Applicable Not Applicable  Skilled Nursing Facility Not Applicable Not Applicable

## 2023-07-31 NOTE — Progress Notes (Signed)
Pt has TOC medications delivered to beside , waiting on lunch and son is on the way to pick pt up.

## 2023-07-31 NOTE — Plan of Care (Signed)
Problem: Fluid Volume: Goal: Hemodynamic stability will improve Outcome: Adequate for Discharge   Problem: Clinical Measurements: Goal: Diagnostic test results will improve Outcome: Adequate for Discharge Goal: Signs and symptoms of infection will decrease Outcome: Adequate for Discharge   Problem: Respiratory: Goal: Ability to maintain adequate ventilation will improve Outcome: Adequate for Discharge   Problem: Education: Goal: Ability to describe self-care measures that may prevent or decrease complications (Diabetes Survival Skills Education) will improve Outcome: Adequate for Discharge Goal: Individualized Educational Video(s) Outcome: Adequate for Discharge   Problem: Coping: Goal: Ability to adjust to condition or change in health will improve Outcome: Adequate for Discharge   Problem: Fluid Volume: Goal: Ability to maintain a balanced intake and output will improve Outcome: Adequate for Discharge   Problem: Health Behavior/Discharge Planning: Goal: Ability to identify and utilize available resources and services will improve Outcome: Adequate for Discharge Goal: Ability to manage health-related needs will improve Outcome: Adequate for Discharge   Problem: Metabolic: Goal: Ability to maintain appropriate glucose levels will improve Outcome: Adequate for Discharge   Problem: Nutritional: Goal: Maintenance of adequate nutrition will improve Outcome: Adequate for Discharge Goal: Progress toward achieving an optimal weight will improve Outcome: Adequate for Discharge   Problem: Skin Integrity: Goal: Risk for impaired skin integrity will decrease Outcome: Adequate for Discharge   Problem: Tissue Perfusion: Goal: Adequacy of tissue perfusion will improve Outcome: Adequate for Discharge   Problem: Education: Goal: Knowledge of General Education information will improve Description: Including pain rating scale, medication(s)/side effects and non-pharmacologic  comfort measures Outcome: Adequate for Discharge   Problem: Health Behavior/Discharge Planning: Goal: Ability to manage health-related needs will improve Outcome: Adequate for Discharge   Problem: Clinical Measurements: Goal: Ability to maintain clinical measurements within normal limits will improve Outcome: Adequate for Discharge Goal: Will remain free from infection Outcome: Adequate for Discharge Goal: Diagnostic test results will improve Outcome: Adequate for Discharge Goal: Respiratory complications will improve Outcome: Adequate for Discharge Goal: Cardiovascular complication will be avoided Outcome: Adequate for Discharge   Problem: Activity: Goal: Risk for activity intolerance will decrease Outcome: Adequate for Discharge   Problem: Nutrition: Goal: Adequate nutrition will be maintained Outcome: Adequate for Discharge   Problem: Coping: Goal: Level of anxiety will decrease Outcome: Adequate for Discharge   Problem: Elimination: Goal: Will not experience complications related to bowel motility Outcome: Adequate for Discharge Goal: Will not experience complications related to urinary retention Outcome: Adequate for Discharge   Problem: Pain Managment: Goal: General experience of comfort will improve Outcome: Adequate for Discharge   Problem: Safety: Goal: Ability to remain free from injury will improve Outcome: Adequate for Discharge   Problem: Skin Integrity: Goal: Risk for impaired skin integrity will decrease Outcome: Adequate for Discharge
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
## 2023-07-31 NOTE — Progress Notes (Signed)
Pt has a slight delay with forgetfulness, states that she wants pain meds before she leaves  Walks with a cane a sin will be here around 1145

## 2023-08-03 ENCOUNTER — Other Ambulatory Visit (HOSPITAL_COMMUNITY): Payer: Self-pay

## 2023-08-06 ENCOUNTER — Other Ambulatory Visit: Payer: Self-pay

## 2023-08-06 ENCOUNTER — Other Ambulatory Visit: Payer: Self-pay | Admitting: Nurse Practitioner

## 2023-08-06 MED ORDER — SENNOSIDES-DOCUSATE SODIUM 8.6-50 MG PO TABS
1.0000 | ORAL_TABLET | Freq: Every day | ORAL | 0 refills | Status: DC
  Filled 2023-08-06 – 2023-08-07 (×2): qty 30, 30d supply, fill #0

## 2023-08-07 ENCOUNTER — Telehealth: Payer: Self-pay | Admitting: Gastroenterology

## 2023-08-07 ENCOUNTER — Other Ambulatory Visit: Payer: Self-pay

## 2023-08-07 DIAGNOSIS — E1140 Type 2 diabetes mellitus with diabetic neuropathy, unspecified: Secondary | ICD-10-CM | POA: Diagnosis not present

## 2023-08-07 DIAGNOSIS — M2550 Pain in unspecified joint: Secondary | ICD-10-CM | POA: Diagnosis not present

## 2023-08-07 DIAGNOSIS — Z79899 Other long term (current) drug therapy: Secondary | ICD-10-CM | POA: Diagnosis not present

## 2023-08-07 DIAGNOSIS — Z6841 Body Mass Index (BMI) 40.0 and over, adult: Secondary | ICD-10-CM | POA: Diagnosis not present

## 2023-08-07 DIAGNOSIS — K5900 Constipation, unspecified: Secondary | ICD-10-CM | POA: Diagnosis not present

## 2023-08-07 MED ORDER — NALOXONE HCL 4 MG/0.1ML NA LIQD
1.0000 | NASAL | 0 refills | Status: DC
  Filled 2023-08-07: qty 2, 2d supply, fill #0

## 2023-08-07 MED ORDER — POLYETHYLENE GLYCOL 3350 17 G PO PACK
17.0000 g | PACK | Freq: Every day | ORAL | 2 refills | Status: DC | PRN
Start: 2023-08-07 — End: 2023-11-28
  Filled 2023-08-07 – 2023-10-25 (×3): qty 14, 14d supply, fill #0

## 2023-08-07 MED ORDER — OXYCODONE HCL 10 MG PO TABS
10.0000 mg | ORAL_TABLET | Freq: Four times a day (QID) | ORAL | 0 refills | Status: DC | PRN
  Filled 2023-08-08 – 2023-08-10 (×3): qty 120, 30d supply, fill #0

## 2023-08-07 MED ORDER — PREGABALIN 50 MG PO CAPS
50.0000 mg | ORAL_CAPSULE | Freq: Two times a day (BID) | ORAL | 1 refills | Status: DC
  Filled 2023-08-23 – 2023-08-28 (×2): qty 60, 30d supply, fill #0
  Filled 2023-11-07: qty 60, 30d supply, fill #1

## 2023-08-07 NOTE — Telephone Encounter (Signed)
Patient called stated she is having a really heard time trying to move her bowels. Seeking advise or a medication script.

## 2023-08-07 NOTE — Progress Notes (Deleted)
Cardiology Office Note:  .   Date:  08/07/2023  ID:  Jill Shaw, DOB 1976-06-09, MRN [id number] PCP: Ivonne Andrew, NP  [facility] Providers Cardiologist:  Nanetta Batty, MD { Click to update primary MD,subspecialty MD or APP then REFRESH:1}   History of Present Illness: .   Jill Shaw is a 47 y.o. female with history of HTN, HFpEF DM2, tobacco dependence, morbid obesity.normal LHC 08/2021.  Last seen 04/2023 to f/u prn.  Discharged 07/31/23 with acute on chronic CHF. She had diffuse pain causing her BP to go up. Crt up so losartan and hydrochlorothiazide stopped. BP improved with aldactone, hydralazine, metoprolol and lasix resumed.  ROS: ***  Studies Reviewed: Marland Kitchen         Prior CV Studies: {Select studies to display:26339}   Echocardiogram 06/01/2021:  Left ventricle cavity is normal in size. Mild concentric hypertrophy of  the left ventricle. Hyperdynamic LV systolic function >70%. Patient is  tachycardic throughout the day. Indeterminate diastolic filling pattern.  No significant valvular abnormality.  Normal right atrial pressure.   Lexiscan/midified Bruce Tetrofosmin stress test 05/23/2021: Lexiscan/modified Bruce nuclear stress test performed using 1-day protocol. SPECT images show very small sized, mild intensity, inferior apical myocardium with mild reversibility. Stress LVEF calculated 40-45%, although visually appears 50-55%. Low risk study.   Vascular US 07/16/2020: No DVT    Risk Assessment/Calculations:   {Does this patient have ATRIAL FIBRILLATION?:[phone]} No BP recorded.  {Refresh Note OR Click here to enter BP  :1}***       Physical Exam:   VS:  There were no vitals taken for this visit.   Wt Readings from Last 3 Encounters:  07/31/23 272 lb 3.2 oz (123.5 kg)  07/27/23 250 lb (113.4 kg)  07/26/23 282 lb (127.9 kg)    GEN: Well nourished, well developed in no acute distress NECK: No JVD; No carotid bruits CARDIAC: ***RRR, no  murmurs, rubs, gallops RESPIRATORY:  Clear to auscultation without rales, wheezing or rhonchi  ABDOMEN: Soft, non-tender, non-distended EXTREMITIES:  No edema; No deformity   ASSESSMENT AND PLAN: .    HTN  HFpEF  DM2  Tobacco abuse  Morbid obesity     {Are you ordering a CV Procedure (e.g. stress test, cath, DCCV, TEE, etc)?   Press F2        :[id number]}  Dispo: ***  Signed, Jacolyn Reedy, PA-C

## 2023-08-07 NOTE — Telephone Encounter (Signed)
The pt states that she would like a prescription  for miralax sent to the pharmacy because she is hopeful that her insurance will cover it. She has a history of constipation.  She will take 17 g in 8 ounces of liquid as needed to maintain bowel habits.

## 2023-08-08 ENCOUNTER — Encounter: Payer: Medicare Other | Admitting: Internal Medicine

## 2023-08-08 ENCOUNTER — Ambulatory Visit: Payer: Self-pay | Admitting: Nurse Practitioner

## 2023-08-08 ENCOUNTER — Other Ambulatory Visit: Payer: Self-pay

## 2023-08-08 ENCOUNTER — Telehealth: Payer: Self-pay

## 2023-08-10 ENCOUNTER — Ambulatory Visit: Payer: Self-pay | Admitting: Nurse Practitioner

## 2023-08-10 ENCOUNTER — Other Ambulatory Visit: Payer: Self-pay

## 2023-08-13 ENCOUNTER — Other Ambulatory Visit: Payer: Self-pay | Admitting: Nurse Practitioner

## 2023-08-13 ENCOUNTER — Other Ambulatory Visit: Payer: Self-pay

## 2023-08-13 ENCOUNTER — Other Ambulatory Visit: Payer: Self-pay | Admitting: Gastroenterology

## 2023-08-13 ENCOUNTER — Other Ambulatory Visit: Payer: Self-pay | Admitting: Obstetrics and Gynecology

## 2023-08-13 DIAGNOSIS — N939 Abnormal uterine and vaginal bleeding, unspecified: Secondary | ICD-10-CM

## 2023-08-13 MED ORDER — MELATONIN 3 MG PO TABS
3.0000 mg | ORAL_TABLET | Freq: Every day | ORAL | 0 refills | Status: DC
  Filled 2023-08-13: qty 90, 90d supply, fill #0
  Filled 2023-09-03: qty 60, 60d supply, fill #0
  Filled 2023-09-26 – 2023-10-25 (×2): qty 60, 60d supply, fill #1

## 2023-08-13 MED ORDER — SUCRALFATE 1 G PO TABS
1.0000 g | ORAL_TABLET | Freq: Three times a day (TID) | ORAL | 3 refills | Status: DC
  Filled 2023-08-13: qty 120, 30d supply, fill #0
  Filled 2023-09-26 (×2): qty 120, 30d supply, fill #1
  Filled 2023-10-27: qty 120, 30d supply, fill #2
  Filled 2023-12-09: qty 120, 30d supply, fill #3

## 2023-08-13 MED ORDER — NICOTINE POLACRILEX 2 MG MT GUM
2.0000 mg | CHEWING_GUM | OROMUCOSAL | 0 refills | Status: DC | PRN
  Filled 2023-08-13: qty 100, 25d supply, fill #0
  Filled 2023-08-14: qty 50, 30d supply, fill #0

## 2023-08-14 ENCOUNTER — Other Ambulatory Visit: Payer: Self-pay

## 2023-08-14 MED ORDER — NORETHINDRONE ACETATE 5 MG PO TABS
5.0000 mg | ORAL_TABLET | Freq: Two times a day (BID) | ORAL | 1 refills | Status: DC
  Filled 2023-08-14: qty 90, 45d supply, fill #0
  Filled 2023-09-26 (×2): qty 90, 45d supply, fill #1

## 2023-08-15 ENCOUNTER — Other Ambulatory Visit: Payer: Self-pay

## 2023-08-16 ENCOUNTER — Other Ambulatory Visit: Payer: Self-pay

## 2023-08-21 ENCOUNTER — Other Ambulatory Visit: Payer: Self-pay

## 2023-08-21 ENCOUNTER — Other Ambulatory Visit: Payer: Self-pay | Admitting: Nurse Practitioner

## 2023-08-21 ENCOUNTER — Ambulatory Visit: Payer: Medicare Other | Attending: Physician Assistant | Admitting: Physician Assistant

## 2023-08-21 DIAGNOSIS — I10 Essential (primary) hypertension: Secondary | ICD-10-CM

## 2023-08-22 ENCOUNTER — Ambulatory Visit: Payer: Medicare Other | Admitting: Podiatry

## 2023-08-22 ENCOUNTER — Encounter: Payer: Self-pay | Admitting: Physician Assistant

## 2023-08-23 ENCOUNTER — Other Ambulatory Visit (HOSPITAL_COMMUNITY): Payer: Self-pay

## 2023-08-23 ENCOUNTER — Other Ambulatory Visit: Payer: Self-pay | Admitting: Nurse Practitioner

## 2023-08-23 ENCOUNTER — Other Ambulatory Visit: Payer: Self-pay

## 2023-08-23 MED ORDER — POTASSIUM CHLORIDE CRYS ER 20 MEQ PO TBCR
20.0000 meq | EXTENDED_RELEASE_TABLET | Freq: Every day | ORAL | 0 refills | Status: DC
  Filled 2023-08-23: qty 30, 30d supply, fill #0

## 2023-08-23 MED ORDER — AMOXICILLIN 875 MG PO TABS
875.0000 mg | ORAL_TABLET | Freq: Two times a day (BID) | ORAL | 0 refills | Status: DC
  Filled 2023-08-23: qty 20, 10d supply, fill #0

## 2023-08-26 ENCOUNTER — Encounter (HOSPITAL_BASED_OUTPATIENT_CLINIC_OR_DEPARTMENT_OTHER): Payer: Self-pay | Admitting: Emergency Medicine

## 2023-08-26 ENCOUNTER — Emergency Department (HOSPITAL_BASED_OUTPATIENT_CLINIC_OR_DEPARTMENT_OTHER)
Admission: EM | Admit: 2023-08-26 | Discharge: 2023-08-26 | Disposition: A | Discharge status: Home / Self Care | Payer: Medicare Other | Attending: Emergency Medicine | Admitting: Emergency Medicine

## 2023-08-26 ENCOUNTER — Other Ambulatory Visit: Payer: Self-pay

## 2023-08-26 DIAGNOSIS — K0889 Other specified disorders of teeth and supporting structures: Secondary | ICD-10-CM | POA: Diagnosis not present

## 2023-08-26 DIAGNOSIS — Z794 Long term (current) use of insulin: Secondary | ICD-10-CM | POA: Insufficient documentation

## 2023-08-26 DIAGNOSIS — Z7951 Long term (current) use of inhaled steroids: Secondary | ICD-10-CM | POA: Insufficient documentation

## 2023-08-26 DIAGNOSIS — E119 Type 2 diabetes mellitus without complications: Secondary | ICD-10-CM | POA: Insufficient documentation

## 2023-08-26 DIAGNOSIS — I509 Heart failure, unspecified: Secondary | ICD-10-CM | POA: Insufficient documentation

## 2023-08-26 DIAGNOSIS — N189 Chronic kidney disease, unspecified: Secondary | ICD-10-CM | POA: Insufficient documentation

## 2023-08-26 DIAGNOSIS — Z7984 Long term (current) use of oral hypoglycemic drugs: Secondary | ICD-10-CM | POA: Insufficient documentation

## 2023-08-26 DIAGNOSIS — E162 Hypoglycemia, unspecified: Secondary | ICD-10-CM

## 2023-08-26 DIAGNOSIS — E11649 Type 2 diabetes mellitus with hypoglycemia without coma: Secondary | ICD-10-CM | POA: Diagnosis not present

## 2023-08-26 DIAGNOSIS — Z79899 Other long term (current) drug therapy: Secondary | ICD-10-CM | POA: Diagnosis not present

## 2023-08-26 DIAGNOSIS — I130 Hypertensive heart and chronic kidney disease with heart failure and stage 1 through stage 4 chronic kidney disease, or unspecified chronic kidney disease: Secondary | ICD-10-CM | POA: Insufficient documentation

## 2023-08-26 DIAGNOSIS — J449 Chronic obstructive pulmonary disease, unspecified: Secondary | ICD-10-CM | POA: Diagnosis not present

## 2023-08-26 LAB — COMPREHENSIVE METABOLIC PANEL
ALT: 6 U/L (ref 0–44)
AST: 17 U/L (ref 15–41)
Albumin: 4.3 g/dL (ref 3.5–5.0)
Alkaline Phosphatase: 65 U/L (ref 38–126)
Anion gap: 7 (ref 5–15)
BUN: 11 mg/dL (ref 6–20)
CO2: 31 mmol/L (ref 22–32)
Calcium: 9.9 mg/dL (ref 8.9–10.3)
Chloride: 104 mmol/L (ref 98–111)
Creatinine, Ser: 1.05 mg/dL — ABNORMAL HIGH (ref 0.44–1.00)
GFR, Estimated: >60 mL/min (ref >60)
Glucose, Bld: 55 mg/dL — ABNORMAL LOW (ref 70–99)
Potassium: 3.5 mmol/L (ref 3.5–5.1)
Sodium: 142 mmol/L (ref 135–145)
Total Bilirubin: 0.4 mg/dL (ref <1.2)
Total Protein: 7.1 g/dL (ref 6.5–8.1)

## 2023-08-26 LAB — CBG MONITORING, ED
Glucose-Capillary: 43 mg/dL — CL (ref 70–99)
Glucose-Capillary: 41 mg/dL — CL (ref 70–99)
Glucose-Capillary: 125 mg/dL — ABNORMAL HIGH (ref 70–99)
Glucose-Capillary: 88 mg/dL (ref 70–99)
Glucose-Capillary: 66 mg/dL — ABNORMAL LOW (ref 70–99)
Glucose-Capillary: 70 mg/dL (ref 70–99)
Glucose-Capillary: 105 mg/dL — ABNORMAL HIGH (ref 70–99)
Glucose-Capillary: 123 mg/dL — ABNORMAL HIGH (ref 70–99)
Glucose-Capillary: 124 mg/dL — ABNORMAL HIGH (ref 70–99)
Glucose-Capillary: 119 mg/dL — ABNORMAL HIGH (ref 70–99)

## 2023-08-26 LAB — CBC WITH DIFFERENTIAL/PLATELET
Abs Immature Granulocytes: 0.02 10*3/uL (ref 0.00–0.07)
Basophils Absolute: 0.1 10*3/uL (ref 0.0–0.1)
Basophils Relative: 1 %
Eosinophils Absolute: 0.3 10*3/uL (ref 0.0–0.5)
Eosinophils Relative: 3 %
HCT: 39.4 % (ref 36.0–46.0)
Hemoglobin: 13 g/dL (ref 12.0–15.0)
Immature Granulocytes: 0 %
Lymphocytes Relative: 29 %
Lymphs Abs: 2.5 10*3/uL (ref 0.7–4.0)
MCH: 26.7 pg (ref 26.0–34.0)
MCHC: 33 g/dL (ref 30.0–36.0)
MCV: 80.9 fL (ref 80.0–100.0)
Monocytes Absolute: 0.7 10*3/uL (ref 0.1–1.0)
Monocytes Relative: 8 %
Neutro Abs: 5.1 10*3/uL (ref 1.7–7.7)
Neutrophils Relative %: 59 %
Platelets: 151 10*3/uL (ref 150–400)
RBC: 4.87 MIL/uL (ref 3.87–5.11)
RDW: 17.1 % — ABNORMAL HIGH (ref 11.5–15.5)
WBC: 8.6 10*3/uL (ref 4.0–10.5)
nRBC: 0 % (ref 0.0–0.2)

## 2023-08-26 MED ORDER — BUPIVACAINE-EPINEPHRINE (PF) 0.5% -1:200000 IJ SOLN
1.8000 mL | Freq: Once | INTRAMUSCULAR | Status: AC
  Administered 2023-08-26: 1.8 mL

## 2023-08-26 MED ORDER — OXYCODONE-ACETAMINOPHEN 5-325 MG PO TABS
1.0000 | ORAL_TABLET | Freq: Once | ORAL | Status: AC
  Administered 2023-08-26: 1 via ORAL
  Filled 2023-08-26: qty 1

## 2023-08-26 MED ORDER — GLUCOSE 40 % PO GEL
1.0000 | Freq: Once | ORAL | Status: AC
  Administered 2023-08-26: 31 g via ORAL
  Filled 2023-08-26: qty 1.21

## 2023-08-26 MED ORDER — DEXTROSE 50 % IV SOLN
1.0000 | Freq: Once | INTRAVENOUS | Status: AC
  Administered 2023-08-26: 50 mL via INTRAVENOUS

## 2023-08-26 MED ORDER — DEXTROSE 50 % IV SOLN
1.0000 | Freq: Once | INTRAVENOUS | Status: AC
  Administered 2023-08-26: 50 mL via INTRAVENOUS
  Filled 2023-08-26: qty 50

## 2023-08-26 MED ORDER — HYDROMORPHONE HCL 1 MG/ML IJ SOLN
1.0000 mg | Freq: Once | INTRAMUSCULAR | Status: AC
  Administered 2023-08-26: 1 mg via INTRAVENOUS
  Filled 2023-08-26: qty 1

## 2023-08-26 MED ORDER — OXYCODONE-ACETAMINOPHEN 5-325 MG PO TABS: 1.0000 | ORAL_TABLET | Freq: Three times a day (TID) | ORAL | 0 refills | Status: AC | PRN

## 2023-08-26 MED ORDER — AMOXICILLIN-POT CLAVULANATE 875-125 MG PO TABS
1.0000 | ORAL_TABLET | Freq: Once | ORAL | Status: AC
  Administered 2023-08-26: 1 via ORAL
  Filled 2023-08-26: qty 1

## 2023-08-26 MED ORDER — AMOXICILLIN-POT CLAVULANATE 875-125 MG PO TABS: 1.0000 | ORAL_TABLET | Freq: Two times a day (BID) | ORAL | 0 refills | Status: AC

## 2023-08-26 NOTE — ED Notes (Signed)
Pt. Given apple juice, cheese crackers, chocolate pudding and peanut butter. CBG 88. Meredith PA notified.

## 2023-08-26 NOTE — Discharge Instructions (Signed)
It was a pleasure caring for you today.  Attached is a list of dentists in the area that you can follow-up with.  Please obtain an appointment ASAP.  Also follow-up with your primary care provider.  Seek emergency care if experiencing any new or worsening symptoms.

## 2023-08-26 NOTE — ED Notes (Signed)
Rose Phi PA states to have pt. Continue taking oral glucose, and to obtain basic lab work. Pt. With peanut butter crackers at bedside.

## 2023-08-26 NOTE — ED Provider Notes (Signed)
[place] EMERGENCY DEPARTMENT AT [facility] Provider Note   CSN: [id number] Arrival date & time: 08/26/23  1351     History  Chief Complaint  Patient presents with   Dental Pain    Jill Shaw is a 47 y.o. female with PMHx arthritis, CHF, chronic pain syndrome, CKD, DM, fibromyalgia, GERD, HLD, HTN, IBS, COPD, sickle cell trait who presents to ED concerned for left side dental pain x2 weeks. Patient started amoxicillin 4 days ago but feels like symptoms are not improving. Appointment with dentist Thursday. Took 10mg  oxy which did not provide relief. Also had tylenol around 4 hours ago.   Denies fever, chest pain, dyspnea, cough, nausea, vomiting, diarrhea.  Denies dyspnea, dysphagia, trismus.   Dental Pain      Home Medications Prior to Admission medications   Medication Sig Start Date End Date Taking? Authorizing Provider  Accu-Chek Softclix Lancets lancets USE TO TEST AS DIRECTED UP TO FOUR TIMES DAILY. 03/06/22   Shamleffer, Konrad Dolores, MD  albuterol (VENTOLIN HFA) 108 (90 Base) MCG/ACT inhaler Inhale 2 puffs into the lungs every 6 (six) hours as needed for wheezing or shortness of breath. 07/18/21   Luciano Cutter, MD  amoxicillin (AMOXIL) 875 MG tablet Take 1 tablet (875 mg total) by mouth 2 (two) times daily for 10 days. 08/23/23 09/02/23  Ivonne Andrew, NP  B-D ULTRAFINE III SHORT PEN 31G X 8 MM MISC USE AS DIRECTED EVERY MORNING, NOON, EVENING, AND AT BEDTIME 07/10/22   Shamleffer, Konrad Dolores, MD  Blood Pressure Monitoring (BLOOD PRESSURE KIT) KIT 1 each by Does not apply route daily. 07/20/23   Ivonne Andrew, NP  Continuous Blood Gluc Sensor (FREESTYLE LIBRE 3 SENSOR) MISC Place 1 sensor on the skin every 14 days. Use to check glucose continuously 07/06/22   Ivonne Andrew, NP  Dexlansoprazole 30 MG capsule DR TAKE 1 CAPSULE (30 MG TOTAL) BY MOUTH DAILY. NEEDS OFFICE VISIT FOR ADDITIONAL REFILLS Patient taking differently: Take 30 mg by  mouth daily as needed (for acid reflux). NEEDS OFFICE VISIT FOR ADDITIONAL REFILLS 03/26/23   Ivonne Andrew, NP  dicyclomine (BENTYL) 20 MG tablet Take 1 tablet (20 mg total) by mouth 2 (two) times daily. Patient taking differently: Take 20 mg by mouth daily as needed for spasms. 06/05/23   Ivonne Andrew, NP  empagliflozin (JARDIANCE) 10 MG TABS tablet Take 1 tablet (10 mg total) by mouth daily. 08/01/23   Zannie Cove, MD  furosemide (LASIX) 40 MG tablet Take 1 tablet (40 mg total) by mouth daily. 07/31/23   Zannie Cove, MD  glucose blood (ACCU-CHEK GUIDE) test strip USE TO TEST BLOOD GLUCOSE AS DIRECTED UP TO 4 TIMES DAILY 11/14/22   Ivonne Andrew, NP  HUMALOG KWIKPEN 200 UNIT/ML KwikPen Inject 50 Units into the skin 3 (three) times daily before meals. 06/05/23   Ivonne Andrew, NP  hydrALAZINE (APRESOLINE) 25 MG tablet Take 1 tablet (25 mg total) by mouth 3 (three) times daily. 07/31/23   Zannie Cove, MD  melatonin 3 MG TABS tablet Take 1 tablet (3 mg total) by mouth at bedtime. 08/13/23   Ivonne Andrew, NP  metoCLOPramide (REGLAN) 10 MG tablet Take 1 tablet (10 mg total) by mouth every 8 (eight) hours as needed for nausea. 05/12/23   Tilden Fossa, MD  metoprolol succinate (TOPROL-XL) 100 MG 24 hr tablet Take 1 tablet (100 mg total) by mouth daily. Take with or immediately following a meal. 07/31/23  Zannie Cove, MD  naloxone [facility]) nasal spray 4 mg/0.1 mL Use 1 (one) spray in nostril, non-aerosol if poorly responding or turning blue. 08/07/23     nicotine (NICODERM CQ - DOSED IN MG/24 HOURS) 21 mg/24hr patch Place 1 patch (21 mg total) onto the skin daily. 07/04/23 07/03/24  Ivonne Andrew, NP  nicotine polacrilex (GOODSENSE NICOTINE) 2 MG gum Take 1 each (2 mg total) by mouth as needed for smoking cessation. 08/13/23   Ivonne Andrew, NP  nitroGLYCERIN (NITROSTAT) 0.4 MG SL tablet Place 1 tablet (0.4 mg total) under the tongue every 5 (five) minutes as needed for  chest pain. Additional refills to be filled by PCP, patient aware 07/09/23   Ivonne Andrew, NP  norethindrone (AYGESTIN) 5 MG tablet Take 1 tablet (5 mg total) by mouth 2 (two) times daily. 08/14/23   Lorriane Shire, MD  ondansetron (ZOFRAN) 4 MG tablet Take 1 tablet (4 mg total) by mouth every 8 (eight) hours as needed for nausea or vomiting. 06/06/23   Ivonne Andrew, NP  Oxycodone HCl 10 MG TABS Take 1 tablet (10 mg total) by mouth every 6 (six) hours as needed for severe pain (pain score 7-10) or moderate pain (pain score 4-6). 07/31/23   Zannie Cove, MD  Oxycodone HCl 10 MG TABS Take 1 tablet (10 mg total) by mouth 4 (four) times daily as needed. 08/07/23     Pimozide 1 MG TABS Take 1 mg by mouth daily as needed (feeling of facial itching). 02/10/23   [provider]  polyethylene glycol (MIRALAX) 17 g packet Take 17 g by mouth daily as needed. 08/07/23   Zehr, Shanda Bumps D, PA-C  potassium chloride SA (KLOR-CON M) 20 MEQ tablet Take 1 tablet (20 mEq total) by mouth daily. 08/23/23   Ivonne Andrew, NP  pregabalin (LYRICA) 50 MG capsule Take 1 capsule (50 mg total) by mouth 2 (two) times daily. 08/07/23     rosuvastatin (CRESTOR) 20 MG tablet TAKE 1 TABLET(20 MG) BY MOUTH DAILY 01/19/23   Patwardhan, Manish J, MD  senna-docusate (SENOKOT-S) 8.6-50 MG tablet Take 1 tablet by mouth daily. 08/06/23   Ivonne Andrew, NP  spironolactone (ALDACTONE) 25 MG tablet Take 1 tablet (25 mg total) by mouth daily. 07/31/23   Zannie Cove, MD  sucralfate (CARAFATE) 1 g tablet Take 1 tablet (1 g total) by mouth 4 (four) times daily -  with meals and at bedtime. 08/13/23   Zehr, Shanda Bumps D, PA-C  SYMBICORT 160-4.5 MCG/ACT inhaler INHALE 2 PUFFS INTO THE LUNGS TWICE DAILY Patient taking differently: Inhale 2 puffs into the lungs daily as needed (for shortness of breath). 10/31/22   Icard, Rachel Bo, DO  tiZANidine (ZANAFLEX) 4 MG tablet Take 1 tablet (4 mg total) by mouth every 8 (eight) hours as  needed for muscle spasms. 07/26/23   Raulkar, Drema Pry, MD  topiramate (TOPAMAX) 25 MG tablet Take 1 tablet (25 mg total) by mouth at bedtime. 07/26/23   Raulkar, Drema Pry, MD  TOUJEO MAX SOLOSTAR 300 UNIT/ML Solostar Pen Inject 40 Units into the skin in the morning. 07/31/23   Zannie Cove, MD      Allergies    Elavil [amitriptyline], Orudis [ketoprofen], Desyrel [trazodone], Tylenol [acetaminophen], Aspirin, Motrin [ibuprofen], Naprosyn [naproxen], Neurontin [gabapentin], Sulfa antibiotics, Ultram [tramadol], Victoza [liraglutide], and Zegerid [omeprazole-sodium bicarbonate]    Review of Systems   Review of Systems  HENT:  Positive for dental problem.     Physical Exam Updated  Vital Signs BP (!) 160/66   Pulse 72   Temp 98.2 F (36.8 C)   Resp (!) 22   SpO2 100%  Physical Exam Vitals and nursing note reviewed.  Constitutional:      General: She is not in acute distress. HENT:     Head: Normocephalic and atraumatic.     Mouth/Throat:     Mouth: Mucous membranes are moist.     Pharynx: Uvula midline. No oropharyngeal exudate or posterior oropharyngeal erythema.      Comments: Broken left lower molar. No obvious swelling or oropharyngeal lesions. Active ROM of jaw unrestricted. Patient tolerating PO intake.  Eyes:     General: No scleral icterus.       Right eye: No discharge.        Left eye: No discharge.     Conjunctiva/sclera: Conjunctivae normal.  Cardiovascular:     Rate and Rhythm: Normal rate and regular rhythm.     Pulses: Normal pulses.     Heart sounds: Normal heart sounds. No murmur heard. Pulmonary:     Effort: Pulmonary effort is normal. No respiratory distress.     Breath sounds: Normal breath sounds. No wheezing, rhonchi or rales.  Abdominal:     General: Abdomen is flat.  Musculoskeletal:     Right lower leg: No edema.     Left lower leg: No edema.  Skin:    General: Skin is warm and dry.     Findings: No rash.  Neurological:     General: No  focal deficit present.     Mental Status: She is alert. Mental status is at baseline.  Psychiatric:        Mood and Affect: Mood normal.     ED Results / Procedures / Treatments   Labs (all labs ordered are listed, but only abnormal results are displayed) Labs Reviewed  CBG MONITORING, ED - Abnormal; Notable for the following components:      Result Value   Glucose-Capillary 43 (*)    All other components within normal limits  CBG MONITORING, ED - Abnormal; Notable for the following components:   Glucose-Capillary 41 (*)    All other components within normal limits  CBG MONITORING, ED    EKG None  Radiology No results found.  Procedures .Nerve Block  Date/Time: 08/26/2023 4:41 PM  Performed by: Dorthy Cooler, PA-C Authorized by: Dorthy Cooler, PA-C   Consent:    Consent obtained:  Verbal   Consent given by:  Patient   Risks, benefits, and alternatives were discussed: yes     Risks discussed:  Allergic reaction, bleeding, intravenous injection, infection, nerve damage, pain, unsuccessful block and swelling   Alternatives discussed:  No treatment Universal protocol:    Procedure explained and questions answered to patient or proxy's satisfaction: yes     Patient identity confirmed:  Verbally with patient Indications:    Indications:  Pain relief Location:    Body area:  Head   Head nerve blocked: left lower molar.   Laterality:  Left Procedure details:    Block needle gauge:  25 G   Anesthetic injected:  Bupivacaine 0.5% WITH epi   Injection procedure:  Anatomic landmarks palpated, incremental injection, introduced needle and negative aspiration for blood   Paresthesia:  None Post-procedure details:    Outcome:  Pain improved   Procedure completion:  Tolerated well, no immediate complications     Medications Ordered in ED Medications  oxyCODONE-acetaminophen (PERCOCET/ROXICET) 5-325 MG per tablet  1 tablet (1 tablet Oral Given 08/26/23 1512)   bupivacaine-epinephrine (PF) (MARCAINE W/ EPI) 0.5% -1:200000 injection 1.8 mL (1.8 mLs Infiltration Given 08/26/23 1531)  dextrose 50 % solution 50 mL (50 mLs Intravenous Given 08/26/23 1545)  dextrose (GLUTOSE) oral gel 40% (31 g Oral Given 08/26/23 1525)  HYDROmorphone (DILAUDID) injection 1 mg (1 mg Intravenous Given 08/26/23 1601)  amoxicillin-clavulanate (AUGMENTIN) 875-125 MG per tablet 1 tablet (1 tablet Oral Given 08/26/23 1626)    ED Course/ Medical Decision Making/ A&P                                 Medical Decision Making Amount and/or Complexity of Data Reviewed Labs: ordered.  Risk OTC drugs. Prescription drug management.    This patient presents to the ED for concern of dental pain/abscess, this involves an extensive number of treatment options, and is a complaint that carries with it a high risk of complications and morbidity.  The differential diagnosis includes deep space infection   Co morbidities that complicate the patient evaluation  arthritis, CHF, chronic pain syndrome, CKD, DM, fibromyalgia, GERD, HLD, HTN, IBS, COPD, sickle cell trait   Additional history obtained:  Dr. Tanda Rockers PCP    Problem List / ED Course / Critical interventions / Medication management  Patient presents to ED concerned for dental pain.  Physical exam showing multiple carries and fractured left lower molar. States that she has an appointment with dentist in 4 days. Patient afebrile with stable vitals. Patient moaning in pain - states that home dose of 10mg  oxy has not been providing relief.  Attempted left lower molar block. Educated patient on risks and answered questions. Block provided minimal relief. Patient asking for more pain medication. Provided her with one dose of dilaudid which resolved pain.  Provided patient with a dose of Augmentin in ED and with discharge with a 7 day course of Augmentin. Will also send patient with a few percocet 5mg  for breakthrough  pain. Provided patient with list for dentist in the area. Educated patient that symptoms will return and may become more complicated if dentist follow up is not obtained. Patient verbally endorsed understanding of plan. Of note, patient hypoglycemic in ED. patient stating that she has not been able to tolerate much PO intake which I believe is why she is hypoglycemic. Provided patient with D50 and oral glucose as well as many PO options. Patient tolerating PO well. Patient requesting to go home and states that she feels a lot better. Educated patient to keep an eye on her blood sugar once she gets home and follow-up with PCP.  Patient verbalized understanding of plan. I have reviewed the patients home medicines and have made adjustments as needed Patient afebrile with stable vitals.  Provided with return precautions.  Discharged in good condition.  Ddx: these are considered less likely due to history of present illness and physical exam findings -Deep space infection of head/neck: Denies dyspnea, dysphagia, trismus -Sepsis: patient afebrile without tachycardia or hypotension; no leukocytosis -Endocarditis: patient afebrile with stable vitals; patient denies HA, SOB, CP, cough; no skin findings on physical exam   Social Determinants of Health:  none          Final Clinical Impression(s) / ED Diagnoses Final diagnoses:  Pain, dental  Hypoglycemia    Rx / DC Orders ED Discharge Orders     None         Valrie Hart  F, PA-C 08/27/23 0009    Rolan Bucco, MD 08/31/23 [phone]

## 2023-08-26 NOTE — ED Triage Notes (Signed)
Dental pain, left side. Started x 2 weeks Started amox but feels like its getting worse.  Appears uncomfortable in triage  Request blood sugar check, felt like it was low.  OJ given. Patient tolerating  Took oxycodone around 10AM without relief

## 2023-08-28 ENCOUNTER — Other Ambulatory Visit: Payer: Self-pay

## 2023-09-03 ENCOUNTER — Other Ambulatory Visit: Payer: Self-pay

## 2023-09-03 ENCOUNTER — Other Ambulatory Visit: Payer: Self-pay | Admitting: Nurse Practitioner

## 2023-09-03 DIAGNOSIS — E119 Type 2 diabetes mellitus without complications: Secondary | ICD-10-CM

## 2023-09-03 DIAGNOSIS — I5032 Chronic diastolic (congestive) heart failure: Secondary | ICD-10-CM

## 2023-09-03 MED ORDER — HYDRALAZINE HCL 25 MG PO TABS
25.0000 mg | ORAL_TABLET | Freq: Three times a day (TID) | ORAL | 0 refills | Status: DC
  Filled 2023-09-03: qty 90, 30d supply, fill #0

## 2023-09-03 MED ORDER — METOPROLOL SUCCINATE ER 100 MG PO TB24
100.0000 mg | ORAL_TABLET | Freq: Every day | ORAL | 0 refills | Status: DC
  Filled 2023-09-03: qty 30, 30d supply, fill #0

## 2023-09-03 MED ORDER — HUMALOG KWIKPEN 200 UNIT/ML ~~LOC~~ SOPN
50.0000 [IU] | PEN_INJECTOR | Freq: Three times a day (TID) | SUBCUTANEOUS | 0 refills | Status: DC
  Filled 2023-09-03: qty 60, 80d supply, fill #0

## 2023-09-03 MED ORDER — SPIRONOLACTONE 25 MG PO TABS
25.0000 mg | ORAL_TABLET | Freq: Every day | ORAL | 0 refills | Status: DC
  Filled 2023-09-03: qty 30, 30d supply, fill #0

## 2023-09-03 MED ORDER — TOUJEO MAX SOLOSTAR 300 UNIT/ML ~~LOC~~ SOPN
60.0000 [IU] | PEN_INJECTOR | Freq: Every morning | SUBCUTANEOUS | 2 refills | Status: DC
  Filled 2023-09-03: qty 3, 15d supply, fill #0
  Filled 2023-09-19: qty 3, 15d supply, fill #1
  Filled 2023-10-11: qty 3, 15d supply, fill #2

## 2023-09-04 ENCOUNTER — Other Ambulatory Visit: Payer: Self-pay

## 2023-09-05 ENCOUNTER — Other Ambulatory Visit: Payer: Self-pay

## 2023-09-06 ENCOUNTER — Other Ambulatory Visit: Payer: Self-pay

## 2023-09-06 ENCOUNTER — Other Ambulatory Visit: Payer: Self-pay | Admitting: Nurse Practitioner

## 2023-09-06 ENCOUNTER — Other Ambulatory Visit (HOSPITAL_COMMUNITY): Payer: Self-pay

## 2023-09-06 MED ORDER — NITROGLYCERIN 0.4 MG SL SUBL
0.4000 mg | SUBLINGUAL_TABLET | SUBLINGUAL | 0 refills | Status: DC | PRN
  Filled 2023-09-06: qty 25, 9d supply, fill #0

## 2023-09-08 ENCOUNTER — Emergency Department (HOSPITAL_BASED_OUTPATIENT_CLINIC_OR_DEPARTMENT_OTHER)
Admission: EM | Admit: 2023-09-08 | Discharge: 2023-09-08 | Disposition: A | Discharge status: Home / Self Care | Payer: Medicare Other | Attending: Emergency Medicine | Admitting: Emergency Medicine

## 2023-09-08 ENCOUNTER — Other Ambulatory Visit: Payer: Self-pay

## 2023-09-08 ENCOUNTER — Encounter (HOSPITAL_BASED_OUTPATIENT_CLINIC_OR_DEPARTMENT_OTHER): Payer: Self-pay | Admitting: Emergency Medicine

## 2023-09-08 DIAGNOSIS — K047 Periapical abscess without sinus: Secondary | ICD-10-CM | POA: Diagnosis not present

## 2023-09-08 DIAGNOSIS — E119 Type 2 diabetes mellitus without complications: Secondary | ICD-10-CM | POA: Insufficient documentation

## 2023-09-08 DIAGNOSIS — I509 Heart failure, unspecified: Secondary | ICD-10-CM | POA: Diagnosis not present

## 2023-09-08 DIAGNOSIS — I110 Hypertensive heart disease with heart failure: Secondary | ICD-10-CM | POA: Diagnosis not present

## 2023-09-08 DIAGNOSIS — Z79899 Other long term (current) drug therapy: Secondary | ICD-10-CM | POA: Insufficient documentation

## 2023-09-08 DIAGNOSIS — Z794 Long term (current) use of insulin: Secondary | ICD-10-CM | POA: Diagnosis not present

## 2023-09-08 DIAGNOSIS — K0889 Other specified disorders of teeth and supporting structures: Secondary | ICD-10-CM

## 2023-09-08 DIAGNOSIS — Z7984 Long term (current) use of oral hypoglycemic drugs: Secondary | ICD-10-CM | POA: Diagnosis not present

## 2023-09-08 LAB — CBC WITH DIFFERENTIAL/PLATELET
Abs Immature Granulocytes: 0.03 10*3/uL (ref 0.00–0.07)
Basophils Absolute: 0.1 10*3/uL (ref 0.0–0.1)
Basophils Relative: 1 %
Eosinophils Absolute: 0.2 10*3/uL (ref 0.0–0.5)
Eosinophils Relative: 2 %
HCT: 41.9 % (ref 36.0–46.0)
Hemoglobin: 13.9 g/dL (ref 12.0–15.0)
Immature Granulocytes: 0 %
Lymphocytes Relative: 30 %
Lymphs Abs: 3.3 10*3/uL (ref 0.7–4.0)
MCH: 26.5 pg (ref 26.0–34.0)
MCHC: 33.2 g/dL (ref 30.0–36.0)
MCV: 80 fL (ref 80.0–100.0)
Monocytes Absolute: 0.9 10*3/uL (ref 0.1–1.0)
Monocytes Relative: 8 %
Neutro Abs: 6.3 10*3/uL (ref 1.7–7.7)
Neutrophils Relative %: 59 %
Platelets: 174 10*3/uL (ref 150–400)
RBC: 5.24 MIL/uL — ABNORMAL HIGH (ref 3.87–5.11)
RDW: 16.5 % — ABNORMAL HIGH (ref 11.5–15.5)
WBC: 10.9 10*3/uL — ABNORMAL HIGH (ref 4.0–10.5)
nRBC: 0 % (ref 0.0–0.2)

## 2023-09-08 LAB — CBG MONITORING, ED
Glucose-Capillary: 158 mg/dL — ABNORMAL HIGH (ref 70–99)
Glucose-Capillary: 71 mg/dL (ref 70–99)
Glucose-Capillary: 46 mg/dL — ABNORMAL LOW (ref 70–99)
Glucose-Capillary: 124 mg/dL — ABNORMAL HIGH (ref 70–99)
Glucose-Capillary: 35 mg/dL — CL (ref 70–99)
Glucose-Capillary: 127 mg/dL — ABNORMAL HIGH (ref 70–99)

## 2023-09-08 LAB — BASIC METABOLIC PANEL
Anion gap: 10 (ref 5–15)
BUN: 13 mg/dL (ref 6–20)
CO2: 28 mmol/L (ref 22–32)
Calcium: 10.1 mg/dL (ref 8.9–10.3)
Chloride: 106 mmol/L (ref 98–111)
Creatinine, Ser: 1.28 mg/dL — ABNORMAL HIGH (ref 0.44–1.00)
GFR, Estimated: 52 mL/min — ABNORMAL LOW (ref >60)
Glucose, Bld: 264 mg/dL — ABNORMAL HIGH (ref 70–99)
Potassium: 2.8 mmol/L — ABNORMAL LOW (ref 3.5–5.1)
Sodium: 144 mmol/L (ref 135–145)

## 2023-09-08 MED ORDER — OXYCODONE-ACETAMINOPHEN 5-325 MG PO TABS
1.0000 | ORAL_TABLET | Freq: Four times a day (QID) | ORAL | 0 refills | Status: DC | PRN
  Filled 2023-09-08: qty 12, 2d supply, fill #0

## 2023-09-08 MED ORDER — CLINDAMYCIN HCL 300 MG PO CAPS: 300.0000 mg | ORAL_CAPSULE | Freq: Four times a day (QID) | ORAL | 0 refills | Status: DC

## 2023-09-08 MED ORDER — HYDROMORPHONE HCL 1 MG/ML IJ SOLN
2.0000 mg | Freq: Once | INTRAMUSCULAR | Status: AC
  Administered 2023-09-08: 2 mg via INTRAMUSCULAR
  Filled 2023-09-08: qty 2

## 2023-09-08 MED ORDER — DEXTROSE 50 % IV SOLN
50.0000 mL | Freq: Once | INTRAVENOUS | Status: AC
  Administered 2023-09-08: 50 mL via INTRAVENOUS
  Filled 2023-09-08: qty 50

## 2023-09-08 MED ORDER — CLINDAMYCIN HCL 300 MG PO CAPS
300.0000 mg | ORAL_CAPSULE | Freq: Four times a day (QID) | ORAL | 0 refills | Status: DC
  Filled 2023-09-08: qty 28, 7d supply, fill #0

## 2023-09-08 MED ORDER — OXYCODONE-ACETAMINOPHEN 5-325 MG PO TABS: 1.0000 | ORAL_TABLET | Freq: Four times a day (QID) | ORAL | 0 refills | Status: DC | PRN

## 2023-09-08 MED ORDER — CLINDAMYCIN HCL 150 MG PO CAPS
300.0000 mg | ORAL_CAPSULE | Freq: Once | ORAL | Status: AC
  Administered 2023-09-08: 300 mg via ORAL
  Filled 2023-09-08: qty 2

## 2023-09-08 NOTE — ED Notes (Signed)
ED Provider at bedside. 

## 2023-09-08 NOTE — ED Notes (Signed)
Report given to [facility]

## 2023-09-08 NOTE — ED Notes (Signed)
Pt. Has been rocking on the edge of the bed, kneeling on the floor, not staying on the stretcher for VS to be obtained.

## 2023-09-08 NOTE — ED Notes (Signed)
Repeat FSBS obtained at 35mg /dl. MD notified immediately. New orders received

## 2023-09-08 NOTE — ED Provider Notes (Signed)
[place] EMERGENCY DEPARTMENT AT [facility] Provider Note   CSN: [id number] Arrival date & time: 09/08/23  0056     History  Chief Complaint  Patient presents with   Dental Pain    Jill Shaw is a 47 y.o. female.  Patient is a 47 year old female with past medical history of chronic pain syndrome, anemia, CHF, obesity, type 2 diabetes, hypertension.  Patient presenting today for evaluation of dental pain.  She describes severe pain to her left rear bottom molar that has been bothering her for the past 2 weeks.  She has an appointment upcoming on Tuesday with a dentist.  No fevers or chills.  No difficulty breathing or swallowing.  The history is provided by the patient.       Home Medications Prior to Admission medications   Medication Sig Start Date End Date Taking? Authorizing Provider  Accu-Chek Softclix Lancets lancets USE TO TEST AS DIRECTED UP TO FOUR TIMES DAILY. 03/06/22   Shamleffer, Konrad Dolores, MD  albuterol (VENTOLIN HFA) 108 (90 Base) MCG/ACT inhaler Inhale 2 puffs into the lungs every 6 (six) hours as needed for wheezing or shortness of breath. 07/18/21   Luciano Cutter, MD  B-D ULTRAFINE III SHORT PEN 31G X 8 MM MISC USE AS DIRECTED EVERY MORNING, NOON, EVENING, AND AT BEDTIME 07/10/22   Shamleffer, Konrad Dolores, MD  Blood Pressure Monitoring (BLOOD PRESSURE KIT) KIT 1 each by Does not apply route daily. 07/20/23   Ivonne Andrew, NP  Continuous Blood Gluc Sensor (FREESTYLE LIBRE 3 SENSOR) MISC Place 1 sensor on the skin every 14 days. Use to check glucose continuously 07/06/22   Ivonne Andrew, NP  Dexlansoprazole 30 MG capsule DR TAKE 1 CAPSULE (30 MG TOTAL) BY MOUTH DAILY. NEEDS OFFICE VISIT FOR ADDITIONAL REFILLS Patient taking differently: Take 30 mg by mouth daily as needed (for acid reflux). NEEDS OFFICE VISIT FOR ADDITIONAL REFILLS 03/26/23   Ivonne Andrew, NP  dicyclomine (BENTYL) 20 MG tablet Take 1 tablet (20 mg total) by mouth 2  (two) times daily. Patient taking differently: Take 20 mg by mouth daily as needed for spasms. 06/05/23   Ivonne Andrew, NP  empagliflozin (JARDIANCE) 10 MG TABS tablet Take 1 tablet (10 mg total) by mouth daily. 08/01/23   Zannie Cove, MD  furosemide (LASIX) 40 MG tablet Take 1 tablet (40 mg total) by mouth daily. 07/31/23   Zannie Cove, MD  glucose blood (ACCU-CHEK GUIDE) test strip USE TO TEST BLOOD GLUCOSE AS DIRECTED UP TO 4 TIMES DAILY 11/14/22   Ivonne Andrew, NP  HUMALOG KWIKPEN 200 UNIT/ML KwikPen Inject 50 Units into the skin 3 (three) times daily before meals. 09/03/23   Ivonne Andrew, NP  hydrALAZINE (APRESOLINE) 25 MG tablet Take 1 tablet (25 mg total) by mouth 3 (three) times daily. 09/03/23   Ivonne Andrew, NP  melatonin 3 MG TABS tablet Take 1 tablet (3 mg total) by mouth at bedtime. 08/13/23   Ivonne Andrew, NP  metoCLOPramide (REGLAN) 10 MG tablet Take 1 tablet (10 mg total) by mouth every 8 (eight) hours as needed for nausea. 05/12/23   Tilden Fossa, MD  metoprolol succinate (TOPROL-XL) 100 MG 24 hr tablet Take 1 tablet (100 mg total) by mouth daily. Take with or immediately following a meal. 09/03/23   Ivonne Andrew, NP  naloxone [facility]) nasal spray 4 mg/0.1 mL Use 1 (one) spray in nostril, non-aerosol if poorly responding or turning blue. 08/07/23  nicotine (NICODERM CQ - DOSED IN MG/24 HOURS) 21 mg/24hr patch Place 1 patch (21 mg total) onto the skin daily. 07/04/23 07/03/24  Ivonne Andrew, NP  nicotine polacrilex (GOODSENSE NICOTINE) 2 MG gum Take 1 each (2 mg total) by mouth as needed for smoking cessation. 08/13/23   Ivonne Andrew, NP  nitroGLYCERIN (NITROSTAT) 0.4 MG SL tablet Place 1 tablet (0.4 mg total) under the tongue every 5 (five) minutes as needed for chest pain. Additional refills to be filled by PCP, patient aware 09/06/23   Ivonne Andrew, NP  norethindrone (AYGESTIN) 5 MG tablet Take 1 tablet (5 mg total) by mouth 2 (two) times  daily. 08/14/23   Lorriane Shire, MD  ondansetron (ZOFRAN) 4 MG tablet Take 1 tablet (4 mg total) by mouth every 8 (eight) hours as needed for nausea or vomiting. 06/06/23   Ivonne Andrew, NP  Oxycodone HCl 10 MG TABS Take 1 tablet (10 mg total) by mouth every 6 (six) hours as needed for severe pain (pain score 7-10) or moderate pain (pain score 4-6). 07/31/23   Zannie Cove, MD  Oxycodone HCl 10 MG TABS Take 1 tablet (10 mg total) by mouth 4 (four) times daily as needed. 08/07/23     Pimozide 1 MG TABS Take 1 mg by mouth daily as needed (feeling of facial itching). 02/10/23   [provider]  polyethylene glycol (MIRALAX) 17 g packet Take 17 g by mouth daily as needed. 08/07/23   Zehr, Shanda Bumps D, PA-C  potassium chloride SA (KLOR-CON M) 20 MEQ tablet Take 1 tablet (20 mEq total) by mouth daily. 08/23/23   Ivonne Andrew, NP  pregabalin (LYRICA) 50 MG capsule Take 1 capsule (50 mg total) by mouth 2 (two) times daily. 08/07/23     rosuvastatin (CRESTOR) 20 MG tablet TAKE 1 TABLET(20 MG) BY MOUTH DAILY 01/19/23   Patwardhan, Manish J, MD  senna-docusate (SENOKOT-S) 8.6-50 MG tablet Take 1 tablet by mouth daily. 08/06/23   Ivonne Andrew, NP  spironolactone (ALDACTONE) 25 MG tablet Take 1 tablet (25 mg total) by mouth daily. 09/03/23   Ivonne Andrew, NP  sucralfate (CARAFATE) 1 g tablet Take 1 tablet (1 g total) by mouth 4 (four) times daily -  with meals and at bedtime. 08/13/23   Zehr, Shanda Bumps D, PA-C  SYMBICORT 160-4.5 MCG/ACT inhaler INHALE 2 PUFFS INTO THE LUNGS TWICE DAILY Patient taking differently: Inhale 2 puffs into the lungs daily as needed (for shortness of breath). 10/31/22   Icard, Rachel Bo, DO  tiZANidine (ZANAFLEX) 4 MG tablet Take 1 tablet (4 mg total) by mouth every 8 (eight) hours as needed for muscle spasms. 07/26/23   Raulkar, Drema Pry, MD  topiramate (TOPAMAX) 25 MG tablet Take 1 tablet (25 mg total) by mouth at bedtime. 07/26/23   Raulkar, Drema Pry, MD   TOUJEO MAX SOLOSTAR 300 UNIT/ML Solostar Pen Inject 40 Units into the skin in the morning. 07/31/23   Zannie Cove, MD  TOUJEO MAX SOLOSTAR 300 UNIT/ML Solostar Pen Inject 60 Units into the skin in the morning. 09/03/23   Ivonne Andrew, NP      Allergies    Elavil [amitriptyline], Orudis [ketoprofen], Desyrel [trazodone], Tylenol [acetaminophen], Aspirin, Motrin [ibuprofen], Naprosyn [naproxen], Neurontin [gabapentin], Sulfa antibiotics, Ultram [tramadol], Victoza [liraglutide], and Zegerid [omeprazole-sodium bicarbonate]    Review of Systems   Review of Systems  All other systems reviewed and are negative.   Physical Exam Updated Vital Signs BP (!) 184/101  Pulse 77   Temp 97.6 F (36.4 C) (Oral)   Resp (!) 22   Ht 5\' 4"  (1.626 m)   Wt 122.5 kg   SpO2 100%   BMI 46.35 kg/m  Physical Exam Vitals and nursing note reviewed.  Constitutional:      Appearance: Normal appearance.     Comments: At the time of my exam, patient is rocking back and forth in the stretcher and appears uncomfortable.  HENT:     Mouth/Throat:     Comments: The left bottom rear molar is partially impacted with surrounding gingival inflammation, but no obvious abscess. Pulmonary:     Effort: Pulmonary effort is normal.     Breath sounds: No stridor.  Neurological:     Mental Status: She is alert and oriented to person, place, and time.     ED Results / Procedures / Treatments   Labs (all labs ordered are listed, but only abnormal results are displayed) Labs Reviewed  CBG MONITORING, ED - Abnormal; Notable for the following components:      Result Value   Glucose-Capillary 158 (*)    All other components within normal limits  CBG MONITORING, ED    EKG None  Radiology No results found.  Procedures Procedures    Medications Ordered in ED Medications  HYDROmorphone (DILAUDID) injection 2 mg (has no administration in time range)  clindamycin (CLEOCIN) capsule 300 mg (has no  administration in time range)    ED Course/ Medical Decision Making/ A&P  Patient with dental infection.  To be treated with clindamycin and continued use of her home oxycodone.  To follow-up with dentistry on Tuesday as scheduled.  Final Clinical Impression(s) / ED Diagnoses Final diagnoses:  None    Rx / DC Orders ED Discharge Orders     None         Geoffery Lyons, MD 09/08/23 [phone]

## 2023-09-08 NOTE — ED Notes (Signed)
Pt.s blood glucose is 46. Per Dr. Judd Lien, 2 more full cups of orange juice, graham crackers w/peanut butter given.Pt. states she took her insulin yesterday at 1400.

## 2023-09-08 NOTE — Discharge Instructions (Addendum)
Begin taking clindamycin as prescribed.  Continue taking oxycodone as previously prescribed.  Follow-up with your dentist on Tuesday as scheduled.

## 2023-09-08 NOTE — ED Triage Notes (Signed)
Patient presents to ED c/o severe left back tooth pain that started yesterday along with diarrhea. Patient reports one of her teeth in the back left is broken and she has been unable to get in with a dentist. Patient shifting back and forth in chair d/t to pain. Patient reports taking Oxycodone without relief.

## 2023-09-08 NOTE — ED Notes (Signed)
Pt.requested to have Blood sugar checked. Accucheck read 71. 2 orange juices given

## 2023-09-09 ENCOUNTER — Other Ambulatory Visit: Payer: Self-pay

## 2023-09-10 ENCOUNTER — Encounter: Payer: Self-pay | Admitting: Adult Health

## 2023-09-10 ENCOUNTER — Other Ambulatory Visit: Payer: Self-pay

## 2023-09-10 ENCOUNTER — Ambulatory Visit: Payer: Self-pay | Admitting: Nurse Practitioner

## 2023-09-10 ENCOUNTER — Ambulatory Visit: Payer: Medicare Other | Admitting: Adult Health

## 2023-09-10 VITALS — BP 171/93 | HR 71 | Temp 98.0°F | Ht 64.0 in | Wt 268.0 lb

## 2023-09-10 DIAGNOSIS — I5032 Chronic diastolic (congestive) heart failure: Secondary | ICD-10-CM | POA: Diagnosis not present

## 2023-09-10 DIAGNOSIS — R0683 Snoring: Secondary | ICD-10-CM | POA: Diagnosis not present

## 2023-09-10 DIAGNOSIS — J449 Chronic obstructive pulmonary disease, unspecified: Secondary | ICD-10-CM

## 2023-09-10 DIAGNOSIS — J42 Unspecified chronic bronchitis: Secondary | ICD-10-CM

## 2023-09-10 MED ORDER — BUDESONIDE-FORMOTEROL FUMARATE 160-4.5 MCG/ACT IN AERO
2.0000 | INHALATION_SPRAY | Freq: Two times a day (BID) | RESPIRATORY_TRACT | 5 refills | Status: DC
  Filled 2023-09-10: qty 10.2, 30d supply, fill #0
  Filled 2023-10-11: qty 10.2, 30d supply, fill #1

## 2023-09-10 MED ORDER — ALBUTEROL SULFATE HFA 108 (90 BASE) MCG/ACT IN AERS
2.0000 | INHALATION_SPRAY | Freq: Four times a day (QID) | RESPIRATORY_TRACT | 3 refills | Status: DC | PRN
  Filled 2023-09-10: qty 8.5, 25d supply, fill #0

## 2023-09-10 NOTE — Patient Instructions (Addendum)
Set up for home sleep study  Work on healthy weight loss  Do not drive if sleepy   Continue on Symbicort 2 puffs Twice daily, rinse after use  Albuterol inhaler 2 puffs every 4hr as needed  Work on not smoking   Refer to cardiology for CHF   Follow up in 6 weeks to go over test results and treatment plan

## 2023-09-10 NOTE — Progress Notes (Signed)
@Patient  ID: Jill Shaw, female    DOB: 1975-11-29, 47 y.o.   MRN: [id number]  Chief Complaint  Patient presents with   Follow-up   Discussed the use of AI scribe software for clinical note transcription with the patient, who gave verbal consent to proceed.  Referring provider: Ivonne Andrew, NP  HPI: 47 yo female  smoker seen for sleep consult 09/10/23 for snoring, restless sleep, insomnia and daytime sleepiness/fatigue.  Patient of Dr. Tonia Brooms followed for COPD Asthma (last seen 12/2021) Medical history significant for DM, HTN, HLD, DCHF, Chronic back pain on chronic narcotics.   TEST/EVENTS :  PFT 09/2019 Moderate restriction, nml DLCO   Chest xray 06/2023 no acute process, CM, pulmonary vascular congestion.    09/10/2023 Sleep consult  Patient presents for sleep consult today. Kindly referred by Ivonne Andrew, NP The patient, with a history of COPD with asthma, heart failure, high blood pressure, diabetes, and chronic pain managed with oxycodone, presents with chronic insomnia. She reports difficulty falling asleep and maintaining sleep, often staying awake for two to three days at a time. Despite feeling tired and yawning frequently, she is unable to nap during the day. They describe waking up every hour and a half during the night and struggling to find a comfortable position in bed. They also mention waking up early in the morning, around 3 or 4 AM, after going to bed late at night, resulting in only a few hours of sleep at a time. Typically goes to bed at 9 pm, and gets up 7 am.   The patient has tried melatonin for sleep, but reports it was not effective. They also mention a family history of sleep disorders, with close relatives on medication for sleep. Had a history of a sleep study conducted approximately 10-15 years ago, which resulted in a diagnosis of insomnia, but no medication was prescribed. They report snoring but deny episodes of gasping for air during  sleep.  The patient also reports anxiety, which they attribute to overthinking. They have a history of irritable bowel syndrome, chronic back pain, arthritis,  and neuropathy, for which they are on Lyrica and Oxycodone.   The patient sleeps on three pillows, feels she breathes better on pillows. They deny current shortness of breath during sleep. They were recently hospitalized for heart failure.   Improved with diuresis. Echo in 2022 showed EF 70%, Mild LVH. LHC 08/2021 showed normal flow in all coronary beds.   No history of CVA. No symptoms suspicious for cataplexy or sleep paralysis. Epworth is 0 out 24. Says she can not nap.  Feels tired all the time and sleepy.  Caffeine intake is 1 cup of coffee in am.  Current weight is 268lbs and BMI is 46.   Allergies  Allergen Reactions   Elavil [Amitriptyline] Other (See Comments)    Coma   Orudis [Ketoprofen] Nausea And Vomiting   Desyrel [Trazodone] Nausea And Vomiting   Tylenol [Acetaminophen] Nausea And Vomiting   Aspirin Nausea Only   Motrin [Ibuprofen] Nausea And Vomiting   Naprosyn [Naproxen] Nausea And Vomiting   Neurontin [Gabapentin] Nausea And Vomiting and Other (See Comments)    upset stomach   Sulfa Antibiotics Nausea And Vomiting   Ultram [Tramadol] Nausea And Vomiting and Other (See Comments)    stomach upset   Victoza [Liraglutide] Nausea And Vomiting   Zegerid [Omeprazole-Sodium Bicarbonate] Nausea And Vomiting    Immunization History  Administered Date(s) Administered   Influenza, Seasonal, Injecte, Preservative Fre  07/30/2023   Influenza,inj,Quad PF,6+ Mos 09/03/2018, 07/03/2019, 07/04/2020, 07/01/2021   PNEUMOCOCCAL CONJUGATE-20 07/30/2023   Pneumococcal Polysaccharide-23 03/28/2015, 07/04/2020   Tdap 03/27/2016   Unspecified SARS-COV-2 Vaccination 03/25/2020, 05/05/2020    Past Medical History:  Diagnosis Date   Abnormal uterine bleeding (AUB)    Arthritis    knees, hands   Atypical chest pain     cardiology--- dr Rosemary Holms did cardiac cath 08-30-2021 showed minimal luminal irregularity involving LAD all other coronaries w/ normal  flow   Chronic diastolic (congestive) heart failure [facility])    cardiologist---- dr Rosemary Holms;  preserved ef   Chronic iron deficiency anemia    Chronic pain syndrome    followed by pain management--- dr Carlis Abbott   CKD (chronic kidney disease), stage III (HCC)    Diabetic gastroparesis (HCC)    Diabetic peripheral neuropathy (HCC)    Fibromyalgia    GAD (generalized anxiety disorder)    Generalized abdominal pain    GERD (gastroesophageal reflux disease)    History of acute renal failure 03/21/2023   admission in epic due to N/V/D due ot severe sepsis POA due to UTI   History of chronic gastritis    inflammatory   History of diabetic ketoacidosis    multiple admission's last 3 in epic 06/ 2022;  01/ 2022;   09/ 2021   History of seizure 01/2016   hypoglycemic seizure   History of vertebral compression fracture 12/2020   T11 -- T12 & L1   Hyperlipidemia    Hypertension    followed by pcp   Insulin dependent type 2 diabetes mellitus (HCC)    uncontrolled,  followed by pcp   Irritable bowel syndrome with constipation    MDD (major depressive disorder)    Moderate COPD (chronic obstructive pulmonary disease) (HCC)    pulmology--- dr Tonia Brooms   Moderate persistent asthma    Sickle cell trait (HCC)    Vitamin D deficiency 10/2019   Wears glasses     Tobacco History: Social History   Tobacco Use  Smoking Status Some Days   Current packs/day: 0.00   Average packs/day: 0.3 packs/day for 26.0 years (6.5 ttl pk-yrs)   Types: Cigarettes   Start date: 09/13/1994   Last attempt to quit: 09/13/2020   Years since quitting: 2.9  Smokeless Tobacco Never  Tobacco Comments   4-5 cigarettes/day   Pt smokes 1/2 ppd. AB. CMA 09-10-23   Ready to quit: Not Answered Counseling given: Not Answered Tobacco comments: 4-5 cigarettes/day Pt smokes 1/2 ppd. AB.  CMA 09-10-23   Outpatient Medications Prior to Visit  Medication Sig Dispense Refill   Accu-Chek Softclix Lancets lancets USE TO TEST AS DIRECTED UP TO FOUR TIMES DAILY. 100 each 0   albuterol (VENTOLIN HFA) 108 (90 Base) MCG/ACT inhaler Inhale 2 puffs into the lungs every 6 (six) hours as needed for wheezing or shortness of breath. 8 g 6   B-D ULTRAFINE III SHORT PEN 31G X 8 MM MISC USE AS DIRECTED EVERY MORNING, NOON, EVENING, AND AT BEDTIME 400 each 0   Blood Pressure Monitoring (BLOOD PRESSURE KIT) KIT 1 each by Does not apply route daily. 1 kit 0   clindamycin (CLEOCIN) 300 MG capsule Take 1 capsule (300 mg total) by mouth 4 (four) times daily. X 7 days 28 capsule 0   Continuous Blood Gluc Sensor (FREESTYLE LIBRE 3 SENSOR) MISC Place 1 sensor on the skin every 14 days. Use to check glucose continuously 6 each 3   Dexlansoprazole 30 MG  capsule DR TAKE 1 CAPSULE (30 MG TOTAL) BY MOUTH DAILY. NEEDS OFFICE VISIT FOR ADDITIONAL REFILLS (Patient taking differently: Take 30 mg by mouth daily as needed (for acid reflux). NEEDS OFFICE VISIT FOR ADDITIONAL REFILLS) 90 capsule 1   dicyclomine (BENTYL) 20 MG tablet Take 1 tablet (20 mg total) by mouth 2 (two) times daily. (Patient taking differently: Take 20 mg by mouth daily as needed for spasms.) 60 tablet 3   empagliflozin (JARDIANCE) 10 MG TABS tablet Take 1 tablet (10 mg total) by mouth daily. 30 tablet 0   furosemide (LASIX) 40 MG tablet Take 1 tablet (40 mg total) by mouth daily. 30 tablet 0   glucose blood (ACCU-CHEK GUIDE) test strip USE TO TEST BLOOD GLUCOSE AS DIRECTED UP TO 4 TIMES DAILY 100 strip 4   HUMALOG KWIKPEN 200 UNIT/ML KwikPen Inject 50 Units into the skin 3 (three) times daily before meals. 60 mL 0   hydrALAZINE (APRESOLINE) 25 MG tablet Take 1 tablet (25 mg total) by mouth 3 (three) times daily. 90 tablet 0   melatonin 3 MG TABS tablet Take 1 tablet (3 mg total) by mouth at bedtime. 120 tablet 0   metoCLOPramide (REGLAN) 10 MG  tablet Take 1 tablet (10 mg total) by mouth every 8 (eight) hours as needed for nausea. 15 tablet 0   metoprolol succinate (TOPROL-XL) 100 MG 24 hr tablet Take 1 tablet (100 mg total) by mouth daily. Take with or immediately following a meal. 30 tablet 0   naloxone (NARCAN) nasal spray 4 mg/0.1 mL Use 1 (one) spray in nostril, non-aerosol if poorly responding or turning blue. 2 each 0   nicotine (NICODERM CQ - DOSED IN MG/24 HOURS) 21 mg/24hr patch Place 1 patch (21 mg total) onto the skin daily. 30 patch 1   nicotine polacrilex (GOODSENSE NICOTINE) 2 MG gum Take 1 each (2 mg total) by mouth as needed for smoking cessation. 100 tablet 0   nitroGLYCERIN (NITROSTAT) 0.4 MG SL tablet Place 1 tablet (0.4 mg total) under the tongue every 5 (five) minutes as needed for chest pain. Additional refills to be filled by PCP, patient aware 25 tablet 0   norethindrone (AYGESTIN) 5 MG tablet Take 1 tablet (5 mg total) by mouth 2 (two) times daily. 90 tablet 1   ondansetron (ZOFRAN) 4 MG tablet Take 1 tablet (4 mg total) by mouth every 8 (eight) hours as needed for nausea or vomiting. 20 tablet 0   Oxycodone HCl 10 MG TABS Take 1 tablet (10 mg total) by mouth every 6 (six) hours as needed for severe pain (pain score 7-10) or moderate pain (pain score 4-6). 45 tablet 0   Oxycodone HCl 10 MG TABS Take 1 tablet (10 mg total) by mouth 4 (four) times daily as needed. 120 tablet 0   Pimozide 1 MG TABS Take 1 mg by mouth daily as needed (feeling of facial itching).     polyethylene glycol (MIRALAX) 17 g packet Take 17 g by mouth daily as needed. 14 each 2   potassium chloride SA (KLOR-CON M) 20 MEQ tablet Take 1 tablet (20 mEq total) by mouth daily. 30 tablet 0   pregabalin (LYRICA) 50 MG capsule Take 1 capsule (50 mg total) by mouth 2 (two) times daily. 60 capsule 1   rosuvastatin (CRESTOR) 20 MG tablet TAKE 1 TABLET(20 MG) BY MOUTH DAILY 90 tablet 3   senna-docusate (SENOKOT-S) 8.6-50 MG tablet Take 1 tablet by mouth  daily. 30 tablet 0  spironolactone (ALDACTONE) 25 MG tablet Take 1 tablet (25 mg total) by mouth daily. 30 tablet 0   sucralfate (CARAFATE) 1 g tablet Take 1 tablet (1 g total) by mouth 4 (four) times daily -  with meals and at bedtime. 120 tablet 3   SYMBICORT 160-4.5 MCG/ACT inhaler INHALE 2 PUFFS INTO THE LUNGS TWICE DAILY (Patient taking differently: Inhale 2 puffs into the lungs daily as needed (for shortness of breath).) 10.2 g 0   TOUJEO MAX SOLOSTAR 300 UNIT/ML Solostar Pen Inject 40 Units into the skin in the morning.     TOUJEO MAX SOLOSTAR 300 UNIT/ML Solostar Pen Inject 60 Units into the skin in the morning. 3 mL 2   oxyCODONE-acetaminophen (PERCOCET/ROXICET) 5-325 MG tablet Take 1-2 tablets by mouth every 6 (six) hours as needed for severe pain (pain score 7-10). (Patient not taking: Reported on 09/10/2023) 12 tablet 0   tiZANidine (ZANAFLEX) 4 MG tablet Take 1 tablet (4 mg total) by mouth every 8 (eight) hours as needed for muscle spasms. 90 tablet 0   topiramate (TOPAMAX) 25 MG tablet Take 1 tablet (25 mg total) by mouth at bedtime. 90 tablet 3   No facility-administered medications prior to visit.     Review of Systems:   Constitutional:   No  weight loss, night sweats,  Fevers, chills, +fatigue, or  lassitude.  HEENT:   No headaches,  Difficulty swallowing,  Tooth/dental problems, or  Sore throat,                No sneezing, itching, ear ache, nasal congestion, post nasal drip,   CV:  No chest pain,  Orthopnea, PND, swelling in lower extremities, anasarca, dizziness, palpitations, syncope.   GI  No heartburn, indigestion, abdominal pain, nausea, vomiting, diarrhea, change in bowel habits, loss of appetite, bloody stools.   Resp: No shortness of breath with exertion or at rest.  No excess mucus, no productive cough,  No non-productive cough,  No coughing up of blood.  No change in color of mucus.  No wheezing.  No chest wall deformity  Skin: no rash or lesions.  GU: no  dysuria, change in color of urine, no urgency or frequency.  No flank pain, no hematuria   MS:  No joint pain or swelling.  No decreased range of motion.  No back pain.    Physical Exam  BP (!) 171/93 (BP Location: Left Arm, Patient Position: Sitting, Cuff Size: Large)   Pulse 71   Temp 98 F (36.7 C) (Oral)   Ht 5\' 4"  (1.626 m)   Wt 268 lb (121.6 kg)   SpO2 97%   BMI 46.00 kg/m   GEN: A/Ox3; pleasant , NAD, well nourished    HEENT:  [place]/AT,  NOSE-clear, THROAT-clear, no lesions, no postnasal drip or exudate noted.  Class 3-4 MP airway   NECK:  Supple w/ fair ROM; no JVD; normal carotid impulses w/o bruits; no thyromegaly or nodules palpated; no lymphadenopathy.    RESP  Clear  P & A; w/o, wheezes/ rales/ or rhonchi. no accessory muscle use, no dullness to percussion  CARD:  RRR, no m/r/g, no peripheral edema, pulses intact, no cyanosis or clubbing.  GI:   Soft & nt; nml bowel sounds; no organomegaly or masses detected.   Musco: Warm bil, no deformities or joint swelling noted.   Neuro: alert, no focal deficits noted.    Skin: Warm, no lesions or rashes    Lab Results:    BMET  BNP    Imaging: No results found.        Latest Ref Rng & Units 10/07/2019    1:51 PM  PFT Results  FVC-Pre L 2.31   FVC-Predicted Pre % 72   FVC-Post L 2.41   FVC-Predicted Post % 76   Pre FEV1/FVC % % 77   Post FEV1/FCV % % 66   FEV1-Pre L 1.78   FEV1-Predicted Pre % 68   FEV1-Post L 1.58   DLCO uncorrected ml/min/mmHg 19.24   DLCO UNC% % 85   DLVA Predicted % 121   TLC L 4.35   TLC % Predicted % 83   RV % Predicted % 109     No results found for: "NITRICOXIDE"      Assessment & Plan:  Assessment and Plan    Possible Obstructive Sleep Apnea They exhibit symptoms indicative of obstructive sleep apnea, including frequent awakenings, daytime sleepiness, snoring. We discussed the risks associated with untreated sleep apnea, such as worsening heart failure,  arrhythmias, and stroke, and explained how it can lead to oxygen desaturation, which stresses the heart and brain. A home sleep study will be ordered to diagnose,Additionally, they will be referred to cardiology for further evaluation due to their congestive heart failure and recent hospitalization. - discussed how weight can impact sleep and risk for sleep disordered breathing - discussed options to assist with weight loss: combination of diet modification, cardiovascular and strength training exercises   - had an extensive discussion regarding the adverse health consequences related to untreated sleep disordered breathing - specifically discussed the risks for hypertension, coronary artery disease, cardiac dysrhythmias, cerebrovascular disease, and diabetes - lifestyle modification discussed   - discussed how sleep disruption can increase risk of accidents, particularly when driving - safe driving practices were discussed     Chronic Insomnia They have chronic insomnia, characterized by difficulty initiating and maintaining sleep, frequent awakenings, and an inability to nap, severely impacting daily functioning. The differential diagnosis includes sleep apnea. We discussed the risks of combining oxycodone with sleep medications.  Sleep medications will not be prescribed until sleep apnea is ruled out. A home sleep study has been ordered, and information on sleep apnea provided. A follow-up visit is scheduled in six weeks to review the sleep study results.  Congestive Heart Failure They have diastolic congestive heart failure, recently hospitalized for fluid overload, with symptoms including shortness of breath and the need to use multiple pillows to sleep. Ongoing cardiology follow-up is required. We discussed how untreated sleep apnea can worsen heart failure and lead to arrhythmias. A referral to cardiology for ongoing management has been made, ensuring follow-up with the cardiology  team.  Asthma and COPD Their asthma and COPD are managed with Symbicort, and they smoke half a pack of cigarettes per day. The importance of smoking cessation for lung health was discussed. Symbicort and albuterol will be refilled, and smoking cessation is encouraged.Check PFT going forward.   General Health Maintenance Routine health maintenance, including flu vaccination, was discussed. Their flu vaccination is up to date   Follow-up A follow-up visit is scheduled in six weeks to review the sleep study results, ensuring the cardiology referral is completed.        I spent  41 minutes dedicated to the care of this patient on the date of this encounter to include pre-visit review of records, face-to-face time with the patient discussing conditions above, post visit ordering of testing, clinical documentation with the electronic health record, making appropriate  referrals as documented, and communicating necessary findings to members of the patients care team.    Rubye Oaks, NP 09/10/2023

## 2023-09-12 ENCOUNTER — Other Ambulatory Visit: Payer: Self-pay

## 2023-09-12 ENCOUNTER — Other Ambulatory Visit: Payer: Self-pay | Admitting: Nurse Practitioner

## 2023-09-12 DIAGNOSIS — M2550 Pain in unspecified joint: Secondary | ICD-10-CM | POA: Diagnosis not present

## 2023-09-12 DIAGNOSIS — Z79899 Other long term (current) drug therapy: Secondary | ICD-10-CM | POA: Diagnosis not present

## 2023-09-12 DIAGNOSIS — Z6841 Body Mass Index (BMI) 40.0 and over, adult: Secondary | ICD-10-CM | POA: Diagnosis not present

## 2023-09-12 DIAGNOSIS — E1140 Type 2 diabetes mellitus with diabetic neuropathy, unspecified: Secondary | ICD-10-CM | POA: Diagnosis not present

## 2023-09-12 MED ORDER — OXYCODONE HCL 10 MG PO TABS
10.0000 mg | ORAL_TABLET | Freq: Four times a day (QID) | ORAL | 0 refills | Status: DC | PRN
  Filled 2023-09-12: qty 120, 30d supply, fill #0

## 2023-09-12 MED ORDER — EMPAGLIFLOZIN 10 MG PO TABS
10.0000 mg | ORAL_TABLET | Freq: Every day | ORAL | 0 refills | Status: DC
  Filled 2023-09-12: qty 30, 30d supply, fill #0

## 2023-09-12 MED ORDER — PREGABALIN 75 MG PO CAPS
75.0000 mg | ORAL_CAPSULE | Freq: Two times a day (BID) | ORAL | 1 refills | Status: DC
  Filled 2023-09-12: qty 60, 30d supply, fill #0
  Filled 2023-09-26 – 2023-10-11 (×2): qty 60, 30d supply, fill #1

## 2023-09-19 ENCOUNTER — Other Ambulatory Visit: Payer: Self-pay | Admitting: Nurse Practitioner

## 2023-09-19 ENCOUNTER — Other Ambulatory Visit: Payer: Self-pay

## 2023-09-19 MED ORDER — HYDRALAZINE HCL 25 MG PO TABS
25.0000 mg | ORAL_TABLET | Freq: Three times a day (TID) | ORAL | 0 refills | Status: DC
  Filled 2023-09-19 – 2023-09-26 (×2): qty 90, 30d supply, fill #0

## 2023-09-20 ENCOUNTER — Other Ambulatory Visit: Payer: Self-pay

## 2023-09-21 ENCOUNTER — Other Ambulatory Visit: Payer: Self-pay

## 2023-09-21 ENCOUNTER — Other Ambulatory Visit: Payer: Self-pay | Admitting: Nurse Practitioner

## 2023-09-21 DIAGNOSIS — I5032 Chronic diastolic (congestive) heart failure: Secondary | ICD-10-CM

## 2023-09-21 MED ORDER — SPIRONOLACTONE 25 MG PO TABS
25.0000 mg | ORAL_TABLET | Freq: Every day | ORAL | 0 refills | Status: DC
  Filled 2023-09-21 – 2023-09-26 (×2): qty 30, 30d supply, fill #0

## 2023-09-21 MED ORDER — METOPROLOL SUCCINATE ER 100 MG PO TB24
100.0000 mg | ORAL_TABLET | Freq: Every day | ORAL | 0 refills | Status: DC
  Filled 2023-09-21 – 2023-09-26 (×2): qty 30, 30d supply, fill #0

## 2023-09-24 ENCOUNTER — Institutional Professional Consult (permissible substitution): Payer: Medicare Other | Admitting: Adult Health

## 2023-09-25 ENCOUNTER — Other Ambulatory Visit: Payer: Self-pay

## 2023-09-26 ENCOUNTER — Other Ambulatory Visit: Payer: Self-pay

## 2023-09-26 ENCOUNTER — Other Ambulatory Visit: Payer: Self-pay | Admitting: Nurse Practitioner

## 2023-09-26 MED ORDER — POTASSIUM CHLORIDE CRYS ER 20 MEQ PO TBCR
20.0000 meq | EXTENDED_RELEASE_TABLET | Freq: Every day | ORAL | 0 refills | Status: DC
  Filled 2023-09-26: qty 30, 30d supply, fill #0

## 2023-09-26 MED ORDER — EMPAGLIFLOZIN 10 MG PO TABS
10.0000 mg | ORAL_TABLET | Freq: Every day | ORAL | 0 refills | Status: DC
  Filled 2023-09-26 – 2023-10-11 (×2): qty 30, 30d supply, fill #0

## 2023-09-26 MED ORDER — NICOTINE POLACRILEX 2 MG MT GUM
2.0000 mg | CHEWING_GUM | OROMUCOSAL | 0 refills | Status: DC | PRN
  Filled 2023-09-26: qty 100, 25d supply, fill #0

## 2023-09-27 ENCOUNTER — Other Ambulatory Visit: Payer: Self-pay

## 2023-09-28 ENCOUNTER — Telehealth: Payer: Self-pay

## 2023-09-28 NOTE — Telephone Encounter (Signed)
Copied from CRM [id number]. Topic: General - Call Back - No Documentation >> Sep 28, 2023  1:49 PM Theodis Sato wrote: Reason for CRM: PT missed a call from nurse and states she will stand by her phone if the nurse could try to call back again today 12/13  No call was placed today. Not sure who pt is referring to. KH

## 2023-09-28 NOTE — Telephone Encounter (Signed)
Copied from CRM [id number]. Topic: Clinical - Prescription Issue >> Sep 28, 2023 10:48 AM Theodis Sato wrote: Reason for CRM: PT is requesting to speak with Angus Seller nurse regarding changing medications as well as changing pharmacies for certain medications.  LM 11:05am CB

## 2023-10-01 ENCOUNTER — Other Ambulatory Visit: Payer: Self-pay

## 2023-10-03 ENCOUNTER — Telehealth: Payer: Self-pay

## 2023-10-03 NOTE — Telephone Encounter (Signed)
Copied from CRM [id number]. Topic: General - Call Back - No Documentation >> Oct 03, 2023  2:32 PM Geroge Baseman wrote: Reason for CRM: Patient trying to reach the office about multiple missed calls she has received. Please call her back as soon as possible. Call back at [phone] please.

## 2023-10-04 ENCOUNTER — Telehealth: Payer: Self-pay | Admitting: Adult Health

## 2023-10-04 NOTE — Telephone Encounter (Signed)
Patient would like to schedule home sleep test. Patient phone number is [phone].

## 2023-10-05 ENCOUNTER — Ambulatory Visit: Payer: Medicare Other

## 2023-10-05 DIAGNOSIS — R0683 Snoring: Secondary | ICD-10-CM

## 2023-10-07 ENCOUNTER — Emergency Department (HOSPITAL_BASED_OUTPATIENT_CLINIC_OR_DEPARTMENT_OTHER): Payer: Medicare Other

## 2023-10-07 ENCOUNTER — Other Ambulatory Visit: Payer: Self-pay

## 2023-10-07 ENCOUNTER — Emergency Department (HOSPITAL_BASED_OUTPATIENT_CLINIC_OR_DEPARTMENT_OTHER)
Admission: EM | Admit: 2023-10-07 | Discharge: 2023-10-07 | Disposition: A | Discharge status: Home / Self Care | Payer: Medicare Other | Attending: Emergency Medicine | Admitting: Emergency Medicine

## 2023-10-07 ENCOUNTER — Encounter (HOSPITAL_BASED_OUTPATIENT_CLINIC_OR_DEPARTMENT_OTHER): Payer: Self-pay

## 2023-10-07 DIAGNOSIS — R112 Nausea with vomiting, unspecified: Secondary | ICD-10-CM | POA: Insufficient documentation

## 2023-10-07 DIAGNOSIS — Z7951 Long term (current) use of inhaled steroids: Secondary | ICD-10-CM | POA: Insufficient documentation

## 2023-10-07 DIAGNOSIS — D72829 Elevated white blood cell count, unspecified: Secondary | ICD-10-CM | POA: Insufficient documentation

## 2023-10-07 DIAGNOSIS — R109 Unspecified abdominal pain: Secondary | ICD-10-CM | POA: Diagnosis not present

## 2023-10-07 DIAGNOSIS — I509 Heart failure, unspecified: Secondary | ICD-10-CM | POA: Insufficient documentation

## 2023-10-07 DIAGNOSIS — R197 Diarrhea, unspecified: Secondary | ICD-10-CM | POA: Insufficient documentation

## 2023-10-07 DIAGNOSIS — J449 Chronic obstructive pulmonary disease, unspecified: Secondary | ICD-10-CM | POA: Diagnosis not present

## 2023-10-07 DIAGNOSIS — E1165 Type 2 diabetes mellitus with hyperglycemia: Secondary | ICD-10-CM | POA: Diagnosis not present

## 2023-10-07 DIAGNOSIS — I110 Hypertensive heart disease with heart failure: Secondary | ICD-10-CM | POA: Insufficient documentation

## 2023-10-07 DIAGNOSIS — F419 Anxiety disorder, unspecified: Secondary | ICD-10-CM | POA: Insufficient documentation

## 2023-10-07 DIAGNOSIS — Z79899 Other long term (current) drug therapy: Secondary | ICD-10-CM | POA: Insufficient documentation

## 2023-10-07 DIAGNOSIS — K429 Umbilical hernia without obstruction or gangrene: Secondary | ICD-10-CM | POA: Diagnosis not present

## 2023-10-07 LAB — COMPREHENSIVE METABOLIC PANEL
ALT: 6 U/L (ref 0–44)
AST: 11 U/L — ABNORMAL LOW (ref 15–41)
Albumin: 4.5 g/dL (ref 3.5–5.0)
Alkaline Phosphatase: 87 U/L (ref 38–126)
Anion gap: 14 (ref 5–15)
BUN: 14 mg/dL (ref 6–20)
CO2: 22 mmol/L (ref 22–32)
Calcium: 10.9 mg/dL — ABNORMAL HIGH (ref 8.9–10.3)
Chloride: 104 mmol/L (ref 98–111)
Creatinine, Ser: 1.13 mg/dL — ABNORMAL HIGH (ref 0.44–1.00)
GFR, Estimated: >60 mL/min (ref >60)
Glucose, Bld: 212 mg/dL — ABNORMAL HIGH (ref 70–99)
Potassium: 3.4 mmol/L — ABNORMAL LOW (ref 3.5–5.1)
Sodium: 140 mmol/L (ref 135–145)
Total Bilirubin: 0.9 mg/dL (ref <1.2)
Total Protein: 7.2 g/dL (ref 6.5–8.1)

## 2023-10-07 LAB — CBC
HCT: 42 % (ref 36.0–46.0)
Hemoglobin: 14.4 g/dL (ref 12.0–15.0)
MCH: 27 pg (ref 26.0–34.0)
MCHC: 34.3 g/dL (ref 30.0–36.0)
MCV: 78.7 fL — ABNORMAL LOW (ref 80.0–100.0)
Platelets: 189 10*3/uL (ref 150–400)
RBC: 5.34 MIL/uL — ABNORMAL HIGH (ref 3.87–5.11)
RDW: 16.6 % — ABNORMAL HIGH (ref 11.5–15.5)
WBC: 11.8 10*3/uL — ABNORMAL HIGH (ref 4.0–10.5)
nRBC: 0 % (ref 0.0–0.2)

## 2023-10-07 LAB — URINALYSIS, ROUTINE W REFLEX MICROSCOPIC
Bilirubin Urine: NEGATIVE
Glucose, UA: NEGATIVE mg/dL
Hgb urine dipstick: NEGATIVE
Leukocytes,Ua: NEGATIVE
Nitrite: NEGATIVE
Protein, ur: NEGATIVE mg/dL
Specific Gravity, Urine: 1.012 (ref 1.005–1.030)
pH: 6.5 (ref 5.0–8.0)

## 2023-10-07 LAB — TROPONIN I (HIGH SENSITIVITY)
Troponin I (High Sensitivity): 4 ng/L (ref <18)
Troponin I (High Sensitivity): 4 ng/L (ref <18)

## 2023-10-07 LAB — CBG MONITORING, ED: Glucose-Capillary: 121 mg/dL — ABNORMAL HIGH (ref 70–99)

## 2023-10-07 LAB — PREGNANCY, URINE: Preg Test, Ur: NEGATIVE

## 2023-10-07 LAB — LIPASE, BLOOD: Lipase: <10 U/L — ABNORMAL LOW (ref 11–51)

## 2023-10-07 MED ORDER — METOCLOPRAMIDE HCL 5 MG/ML IJ SOLN
10.0000 mg | Freq: Once | INTRAMUSCULAR | Status: AC
  Administered 2023-10-07: 10 mg via INTRAVENOUS
  Filled 2023-10-07: qty 2

## 2023-10-07 MED ORDER — HYDROMORPHONE HCL 1 MG/ML IJ SOLN
1.0000 mg | Freq: Once | INTRAMUSCULAR | Status: AC
  Administered 2023-10-07: 1 mg via INTRAVENOUS
  Filled 2023-10-07: qty 1

## 2023-10-07 MED ORDER — DROPERIDOL 2.5 MG/ML IJ SOLN
2.5000 mg | Freq: Once | INTRAMUSCULAR | Status: AC
  Administered 2023-10-07: 2.5 mg via INTRAVENOUS
  Filled 2023-10-07: qty 2

## 2023-10-07 MED ORDER — LACTATED RINGERS IV BOLUS
1000.0000 mL | Freq: Once | INTRAVENOUS | Status: AC
  Administered 2023-10-07: 1000 mL via INTRAVENOUS

## 2023-10-07 MED ORDER — METOCLOPRAMIDE HCL 10 MG PO TABS
10.0000 mg | ORAL_TABLET | Freq: Three times a day (TID) | ORAL | 0 refills | Status: DC | PRN
  Filled 2023-10-07: qty 20, 7d supply, fill #0

## 2023-10-07 MED ORDER — IOHEXOL 300 MG/ML  SOLN
100.0000 mL | Freq: Once | INTRAMUSCULAR | Status: AC | PRN
  Administered 2023-10-07: 100 mL via INTRAVENOUS

## 2023-10-07 MED ORDER — ONDANSETRON HCL 4 MG/2ML IJ SOLN
4.0000 mg | Freq: Once | INTRAMUSCULAR | Status: AC
  Administered 2023-10-07: 4 mg via INTRAVENOUS

## 2023-10-07 MED ORDER — LORAZEPAM 2 MG/ML IJ SOLN
1.0000 mg | Freq: Once | INTRAMUSCULAR | Status: AC
  Administered 2023-10-07: 1 mg via INTRAVENOUS
  Filled 2023-10-07: qty 1

## 2023-10-07 MED ORDER — OXYCODONE HCL 5 MG PO TABS
10.0000 mg | ORAL_TABLET | Freq: Once | ORAL | Status: AC
  Administered 2023-10-07: 10 mg via ORAL
  Filled 2023-10-07: qty 2

## 2023-10-07 NOTE — ED Notes (Signed)
Pt is severely nauseous but no vomiting present

## 2023-10-07 NOTE — ED Notes (Signed)
Pt given ginger ale per pt request for PO challenge

## 2023-10-07 NOTE — ED Notes (Signed)
Pt tolerated PO challenge

## 2023-10-07 NOTE — ED Notes (Signed)
Pt sleeping. 

## 2023-10-07 NOTE — Discharge Instructions (Signed)
Take medications as prescribed.  Start with a clear liquid diet advance slowly as tolerated.  Follow-up with your primary doctor as well as a gastroenterologist.  Return to the ED with new or worsening symptoms

## 2023-10-07 NOTE — ED Provider Notes (Signed)
[place] EMERGENCY DEPARTMENT AT [facility] Provider Note   CSN: [id number] Arrival date & time: 10/07/23  0017     History  Chief Complaint  Patient presents with   Abdominal Pain    Jill Shaw is a 47 y.o. female.  Patient with a history of hypertension, sickle cell trait, seizure, diabetes, CHF, COPD, fibromyalgia, gastroparesis presenting with abdominal pain, nausea and vomiting.  States symptoms similar to previous episodes of gastroparesis.  Has had 6-10 episodes of both vomiting and diarrhea today that have been nonbloody and nonbilious.  No chest pain or shortness of breath.  Has diffuse crampy abdominal pain similar to previous episodes of gastroparesis.  Still has all of her organs.  Denies fever.  No pain with urination or blood in the urine.  No vaginal bleeding or discharge. No black or bloody stools.  No pain with urination or blood in the urine  The history is provided by the patient.  Abdominal Pain Associated symptoms: diarrhea, nausea and vomiting   Associated symptoms: no chest pain, no cough, no dysuria, no fever, no hematuria and no shortness of breath        Home Medications Prior to Admission medications   Medication Sig Start Date End Date Taking? Authorizing Provider  Accu-Chek Softclix Lancets lancets USE TO TEST AS DIRECTED UP TO FOUR TIMES DAILY. 03/06/22   Shamleffer, Konrad Dolores, MD  albuterol (VENTOLIN HFA) 108 (90 Base) MCG/ACT inhaler Inhale 2 puffs into the lungs every 6 (six) hours as needed for wheezing or shortness of breath. 09/10/23   Parrett, Virgel Bouquet, NP  B-D ULTRAFINE III SHORT PEN 31G X 8 MM MISC USE AS DIRECTED EVERY MORNING, NOON, EVENING, AND AT BEDTIME 07/10/22   Shamleffer, Konrad Dolores, MD  Blood Pressure Monitoring (BLOOD PRESSURE KIT) KIT 1 each by Does not apply route daily. 07/20/23   Ivonne Andrew, NP  clindamycin (CLEOCIN) 300 MG capsule Take 1 capsule (300 mg total) by mouth 4 (four) times daily. X 7  days 09/08/23   Geoffery Lyons, MD  Continuous Blood Gluc Sensor (FREESTYLE LIBRE 3 SENSOR) MISC Place 1 sensor on the skin every 14 days. Use to check glucose continuously 07/06/22   Ivonne Andrew, NP  Dexlansoprazole 30 MG capsule DR TAKE 1 CAPSULE (30 MG TOTAL) BY MOUTH DAILY. NEEDS OFFICE VISIT FOR ADDITIONAL REFILLS Patient taking differently: Take 30 mg by mouth daily as needed (for acid reflux). NEEDS OFFICE VISIT FOR ADDITIONAL REFILLS 03/26/23   Ivonne Andrew, NP  dicyclomine (BENTYL) 20 MG tablet Take 1 tablet (20 mg total) by mouth 2 (two) times daily. Patient taking differently: Take 20 mg by mouth daily as needed for spasms. 06/05/23   Ivonne Andrew, NP  empagliflozin (JARDIANCE) 10 MG TABS tablet Take 1 tablet (10 mg total) by mouth daily. 09/26/23   Ivonne Andrew, NP  furosemide (LASIX) 40 MG tablet Take 1 tablet (40 mg total) by mouth daily. 07/31/23   Zannie Cove, MD  glucose blood (ACCU-CHEK GUIDE) test strip USE TO TEST BLOOD GLUCOSE AS DIRECTED UP TO 4 TIMES DAILY 11/14/22   Ivonne Andrew, NP  HUMALOG KWIKPEN 200 UNIT/ML KwikPen Inject 50 Units into the skin 3 (three) times daily before meals. 09/03/23   Ivonne Andrew, NP  hydrALAZINE (APRESOLINE) 25 MG tablet Take 1 tablet (25 mg total) by mouth 3 (three) times daily. 09/19/23   Ivonne Andrew, NP  melatonin 3 MG TABS tablet Take 1 tablet (3  mg total) by mouth at bedtime. 08/13/23   Ivonne Andrew, NP  metoCLOPramide (REGLAN) 10 MG tablet Take 1 tablet (10 mg total) by mouth every 8 (eight) hours as needed for nausea. 05/12/23   Tilden Fossa, MD  metoprolol succinate (TOPROL-XL) 100 MG 24 hr tablet Take 1 tablet (100 mg total) by mouth daily. Take with or immediately following a meal. 09/21/23   Ivonne Andrew, NP  naloxone [facility]) nasal spray 4 mg/0.1 mL Use 1 (one) spray in nostril, non-aerosol if poorly responding or turning blue. 08/07/23     nicotine (NICODERM CQ - DOSED IN MG/24 HOURS) 21 mg/24hr  patch Place 1 patch (21 mg total) onto the skin daily. 07/04/23 07/03/24  Ivonne Andrew, NP  nicotine polacrilex (GOODSENSE NICOTINE) 2 MG gum Take 1 each (2 mg total) by mouth as needed for smoking cessation. 09/26/23   Ivonne Andrew, NP  nitroGLYCERIN (NITROSTAT) 0.4 MG SL tablet Place 1 tablet (0.4 mg total) under the tongue every 5 (five) minutes as needed for chest pain. Additional refills to be filled by PCP, patient aware 09/06/23   Ivonne Andrew, NP  norethindrone (AYGESTIN) 5 MG tablet Take 1 tablet (5 mg total) by mouth 2 (two) times daily. 08/14/23   Lorriane Shire, MD  ondansetron (ZOFRAN) 4 MG tablet Take 1 tablet (4 mg total) by mouth every 8 (eight) hours as needed for nausea or vomiting. 06/06/23   Ivonne Andrew, NP  Oxycodone HCl 10 MG TABS Take 1 tablet (10 mg total) by mouth every 6 (six) hours as needed for severe pain (pain score 7-10) or moderate pain (pain score 4-6). 07/31/23   Zannie Cove, MD  Oxycodone HCl 10 MG TABS Take 1 tablet (10 mg total) by mouth 4 (four) times daily as needed. 08/07/23     Oxycodone HCl 10 MG TABS Take 1 tablet (10 mg total) by mouth 4 (four) times daily as needed. 09/12/23     Pimozide 1 MG TABS Take 1 mg by mouth daily as needed (feeling of facial itching). 02/10/23   [provider]  polyethylene glycol (MIRALAX) 17 g packet Take 17 g by mouth daily as needed. 08/07/23   Zehr, Shanda Bumps D, PA-C  potassium chloride SA (KLOR-CON M) 20 MEQ tablet Take 1 tablet (20 mEq total) by mouth daily. 09/26/23   Ivonne Andrew, NP  pregabalin (LYRICA) 50 MG capsule Take 1 capsule (50 mg total) by mouth 2 (two) times daily. 08/07/23     pregabalin (LYRICA) 75 MG capsule Take 1 capsule (75 mg total) by mouth 2 (two) times daily. 09/12/23     rosuvastatin (CRESTOR) 20 MG tablet TAKE 1 TABLET(20 MG) BY MOUTH DAILY 01/19/23   Patwardhan, Manish J, MD  senna-docusate (SENOKOT-S) 8.6-50 MG tablet Take 1 tablet by mouth daily. 12/21/22 10/25/23   Mardene Sayer, MD  senna-docusate (SENOKOT-S) 8.6-50 MG tablet Take 1 tablet by mouth daily. 08/06/23   Ivonne Andrew, NP  spironolactone (ALDACTONE) 25 MG tablet Take 1 tablet (25 mg total) by mouth daily. 09/21/23   Ivonne Andrew, NP  sucralfate (CARAFATE) 1 g tablet Take 1 tablet (1 g total) by mouth 4 (four) times daily -  with meals and at bedtime. 08/13/23   Zehr, Princella Pellegrini, PA-C  budesonide-formoterol (SYMBICORT) 160-4.5 MCG/ACT inhaler Inhale 2 puffs into the lungs 2 (two) times daily. INHALE 2 PUFFS INTO THE LUNGS TWICE DAILY 09/10/23   Parrett, Virgel Bouquet, NP  TOUJEO MAX Vicente Masson  300 UNIT/ML Solostar Pen Inject 40 Units into the skin in the morning. 07/31/23   Zannie Cove, MD  TOUJEO MAX SOLOSTAR 300 UNIT/ML Solostar Pen Inject 60 Units into the skin in the morning. 09/03/23   Ivonne Andrew, NP      Allergies    Elavil [amitriptyline], Orudis [ketoprofen], Desyrel [trazodone], Tylenol [acetaminophen], Aspirin, Motrin [ibuprofen], Naprosyn [naproxen], Neurontin [gabapentin], Sulfa antibiotics, Ultram [tramadol], Victoza [liraglutide], and Zegerid [omeprazole-sodium bicarbonate]    Review of Systems   Review of Systems  Constitutional:  Positive for activity change and appetite change. Negative for fever.  HENT:  Negative for congestion and rhinorrhea.   Respiratory:  Negative for cough, chest tightness and shortness of breath.   Cardiovascular:  Negative for chest pain.  Gastrointestinal:  Positive for abdominal pain, diarrhea, nausea and vomiting.  Genitourinary:  Negative for dysuria and hematuria.  Musculoskeletal:  Negative for arthralgias and myalgias.  Skin:  Negative for rash.  Neurological:  Negative for dizziness, weakness and headaches.   all other systems are negative except as noted in the HPI and PMH.    Physical Exam Updated Vital Signs BP (!) 135/92 (BP Location: Right Arm)   Pulse 94   Temp 98.5 F (36.9 C)   Resp (!) 30   SpO2 100%  Physical  Exam Vitals and nursing note reviewed.  Constitutional:      General: She is not in acute distress.    Appearance: She is well-developed. She is ill-appearing.     Comments: Uncomfortable, anxious, patient in the room, tearful  HENT:     Head: Normocephalic and atraumatic.     Mouth/Throat:     Pharynx: No oropharyngeal exudate.  Eyes:     Conjunctiva/sclera: Conjunctivae normal.     Pupils: Pupils are equal, round, and reactive to light.  Neck:     Comments: No meningismus. Cardiovascular:     Rate and Rhythm: Normal rate and regular rhythm.     Heart sounds: Normal heart sounds. No murmur heard. Pulmonary:     Effort: Pulmonary effort is normal. No respiratory distress.     Breath sounds: Normal breath sounds.  Abdominal:     Palpations: Abdomen is soft.     Tenderness: There is abdominal tenderness. There is guarding. There is no rebound.     Comments: Diffuse tenderness with voluntary guarding throughout.  Musculoskeletal:        General: No tenderness. Normal range of motion.     Cervical back: Normal range of motion and neck supple.  Skin:    General: Skin is warm.  Neurological:     Mental Status: She is alert and oriented to person, place, and time.     Cranial Nerves: No cranial nerve deficit.     Motor: No abnormal muscle tone.     Coordination: Coordination normal.     Comments:  5/5 strength throughout. CN 2-12 intact.Equal grip strength.   Psychiatric:        Behavior: Behavior normal.     ED Results / Procedures / Treatments   Labs (all labs ordered are listed, but only abnormal results are displayed) Labs Reviewed  LIPASE, BLOOD - Abnormal; Notable for the following components:      Result Value   Lipase <10 (*)    All other components within normal limits  COMPREHENSIVE METABOLIC PANEL - Abnormal; Notable for the following components:   Potassium 3.4 (*)    Glucose, Bld 212 (*)    Creatinine, Ser 1.13 (*)  Calcium 10.9 (*)    AST 11 (*)    All  other components within normal limits  CBC - Abnormal; Notable for the following components:   WBC 11.8 (*)    RBC 5.34 (*)    MCV 78.7 (*)    RDW 16.6 (*)    All other components within normal limits  URINALYSIS, ROUTINE W REFLEX MICROSCOPIC - Abnormal; Notable for the following components:   Ketones, ur TRACE (*)    All other components within normal limits  CBG MONITORING, ED - Abnormal; Notable for the following components:   Glucose-Capillary 121 (*)    All other components within normal limits  PREGNANCY, URINE  TROPONIN I (HIGH SENSITIVITY)  TROPONIN I (HIGH SENSITIVITY)    EKG EKG Interpretation Date/Time:  Sunday October 07 2023 00:28:35 EST Ventricular Rate:  95 PR Interval:  150 QRS Duration:  90 QT Interval:  340 QTC Calculation: 427 R Axis:   -35  Text Interpretation: Normal sinus rhythm Left axis deviation Low voltage QRS Cannot rule out Anterior infarct (cited on or before 07-Oct-2023) Abnormal ECG When compared with ECG of 28-Jul-2023 17:26, No significant change was found No significant change was found Confirmed by Glynn Octave [phone]) on 10/07/2023 1:17:56 AM  Radiology No results found.  Procedures Procedures    Medications Ordered in ED Medications  lactated ringers bolus 1,000 mL (has no administration in time range)  lactated ringers bolus 1,000 mL (has no administration in time range)  LORazepam (ATIVAN) injection 1 mg (has no administration in time range)  ondansetron (ZOFRAN) injection 4 mg (has no administration in time range)  metoCLOPramide (REGLAN) injection 10 mg (10 mg Intravenous Given 10/07/23 0115)    ED Course/ Medical Decision Making/ A&P                                 Medical Decision Making Amount and/or Complexity of Data Reviewed Labs: ordered. Decision-making details documented in ED Course. Radiology: ordered and independent interpretation performed. Decision-making details documented in ED Course. ECG/medicine  tests: ordered and independent interpretation performed. Decision-making details documented in ED Course.  Risk Prescription drug management.   Diabetic with history of gastroparesis here with nausea, vomiting, diarrhea, abdominal pain onset 2 days ago.  Tachypneic and hyperventilating but stable vitals.  Abdomen soft but diffusely tender.  Patient will be hydrated and symptoms will be treated.  Labs with hyperglycemia without DKA.  Workup reassuring.  Hyperglycemia with normal anion gap.  LFTs and lipase are normal.  Minimal leukocytosis.  Troponin negative x 2 with low concern for ACS.  CT scan shows no acute pathology.  Does show a chronically malpositioned IUD which she is aware of.  Patient confirms she is not off her oxycodone.  Has had issues in the past of opiate withdrawal and ran out of her medications early.  Denies this is the case.  She is requesting medication to help with her cramps that is not an opiate.  Will try Bentyl.  She does have a history of prolonged QT but normal QT today.  Reglan will be given due to this.  Needs to follow-up with gastroenterology.  This appears to be a exacerbation of her gastroparesis.  Continue Carafate.  Tolerating p.o.  No further vomiting.  Recommend following up with GI and continue her antiemetics and pain medication at home.  Abdomen soft without any acute findings on CT scan.  Labs reassuring without  DKA or anion gap acidosis.  Start with clear liquid diet advance slowly as tolerated.  Return precautions discussed        Final Clinical Impression(s) / ED Diagnoses Final diagnoses:  Nausea and vomiting, unspecified vomiting type    Rx / DC Orders ED Discharge Orders     None         Eleah Lahaie, Jeannett Senior, MD 10/07/23 [phone]

## 2023-10-07 NOTE — ED Triage Notes (Signed)
Pt c/o "all over cramping" abd pain, NVD, back pain onset yesterday. Denies urinary symptoms

## 2023-10-08 ENCOUNTER — Telehealth: Payer: Self-pay

## 2023-10-08 ENCOUNTER — Other Ambulatory Visit: Payer: Self-pay

## 2023-10-08 NOTE — Transitions of Care (Post Inpatient/ED Visit) (Unsigned)
   10/08/2023  Name: [name] MRN: [id number] DOB: 1976-10-15  Today's TOC FU Call Status: Today's TOC FU Call Status:: Unsuccessful Call (1st Attempt) Unsuccessful Call (1st Attempt) Date: 10/08/23  Attempted to reach the patient regarding the most recent Inpatient/ED visit.  Follow Up Plan: Additional outreach attempts will be made to reach the patient to complete the Transitions of Care (Post Inpatient/ED visit) call.   Signature Karena Addison, LPN [facility] Nurse Health Advisor Direct Dial [phone]

## 2023-10-11 ENCOUNTER — Other Ambulatory Visit: Payer: Self-pay | Admitting: Nurse Practitioner

## 2023-10-11 ENCOUNTER — Other Ambulatory Visit: Payer: Self-pay

## 2023-10-11 DIAGNOSIS — E119 Type 2 diabetes mellitus without complications: Secondary | ICD-10-CM

## 2023-10-11 MED ORDER — HUMALOG KWIKPEN 200 UNIT/ML ~~LOC~~ SOPN
50.0000 [IU] | PEN_INJECTOR | Freq: Three times a day (TID) | SUBCUTANEOUS | 0 refills | Status: DC
  Filled 2023-10-11 – 2023-10-24 (×2): qty 60, 80d supply, fill #0

## 2023-10-11 MED ORDER — NICOTINE 21 MG/24HR TD PT24
21.0000 mg | MEDICATED_PATCH | TRANSDERMAL | 1 refills | Status: DC
  Filled 2023-10-11: qty 28, 28d supply, fill #0
  Filled 2023-11-07: qty 28, 28d supply, fill #1

## 2023-10-11 MED ORDER — OXYCODONE HCL 10 MG PO TABS
10.0000 mg | ORAL_TABLET | Freq: Four times a day (QID) | ORAL | 0 refills | Status: DC | PRN
  Filled 2023-10-11: qty 8, 2d supply, fill #0

## 2023-10-11 NOTE — Telephone Encounter (Signed)
Please advised. KH 

## 2023-10-12 ENCOUNTER — Other Ambulatory Visit: Payer: Self-pay

## 2023-10-13 DIAGNOSIS — Z79899 Other long term (current) drug therapy: Secondary | ICD-10-CM | POA: Diagnosis not present

## 2023-10-13 DIAGNOSIS — M654 Radial styloid tenosynovitis [de Quervain]: Secondary | ICD-10-CM | POA: Diagnosis not present

## 2023-10-13 DIAGNOSIS — Z6841 Body Mass Index (BMI) 40.0 and over, adult: Secondary | ICD-10-CM | POA: Diagnosis not present

## 2023-10-13 DIAGNOSIS — E1140 Type 2 diabetes mellitus with diabetic neuropathy, unspecified: Secondary | ICD-10-CM | POA: Diagnosis not present

## 2023-10-13 DIAGNOSIS — M2550 Pain in unspecified joint: Secondary | ICD-10-CM | POA: Diagnosis not present

## 2023-10-15 ENCOUNTER — Other Ambulatory Visit: Payer: Self-pay

## 2023-10-15 MED ORDER — OXYCODONE HCL 10 MG PO TABS
10.0000 mg | ORAL_TABLET | Freq: Four times a day (QID) | ORAL | 0 refills | Status: DC
  Filled 2023-10-15 (×2): qty 120, 30d supply, fill #0

## 2023-10-15 MED ORDER — DICLOFENAC SODIUM 1 % EX GEL
CUTANEOUS | 0 refills | Status: DC
  Filled 2023-10-15: qty 300, 75d supply, fill #0
  Filled 2023-10-27: qty 300, 9d supply, fill #0
  Filled 2023-11-01: qty 300, 30d supply, fill #0
  Filled 2023-11-07: qty 300, fill #0

## 2023-10-15 MED ORDER — PREGABALIN 75 MG PO CAPS
75.0000 mg | ORAL_CAPSULE | Freq: Two times a day (BID) | ORAL | 1 refills | Status: DC
  Filled 2023-10-27 – 2023-11-08 (×7): qty 60, 30d supply, fill #0

## 2023-10-15 MED ORDER — NALOXONE HCL 4 MG/0.1ML NA LIQD
1.0000 | NASAL | 0 refills | Status: AC
  Filled 2023-10-15: qty 2, 2d supply, fill #0
  Filled 2023-10-27 – 2023-11-07 (×2): qty 2, fill #0
  Filled 2024-03-09: qty 2, 1d supply, fill #0

## 2023-10-18 NOTE — Transitions of Care (Post Inpatient/ED Visit) (Signed)
   10/18/2023  Name: Jill Shaw MRN: [id number] DOB: 07-12-1976  Today's TOC FU Call Status: Today's TOC FU Call Status:: Unsuccessful Call (2nd Attempt) Unsuccessful Call (1st Attempt) Date: 10/08/23 Unsuccessful Call (2nd Attempt) Date: 10/18/23  Attempted to reach the patient regarding the most recent Inpatient/ED visit.  Follow Up Plan: Additional outreach attempts will be made to reach the patient to complete the Transitions of Care (Post Inpatient/ED visit) call.   Signature Julian Lemmings, LPN [facility] Nurse Health Advisor Direct Dial  [phone]

## 2023-10-18 NOTE — Transitions of Care (Post Inpatient/ED Visit) (Signed)
   10/18/2023  Name: Jill Shaw MRN: [id number] DOB: 1975/11/25  Today's TOC FU Call Status: Today's TOC FU Call Status:: Unsuccessful Call (3rd Attempt) Unsuccessful Call (1st Attempt) Date: 10/08/23 Unsuccessful Call (2nd Attempt) Date: 10/18/23 Unsuccessful Call (3rd Attempt) Date: 10/18/23  Attempted to reach the patient regarding the most recent Inpatient/ED visit.  Follow Up Plan: No further outreach attempts will be made at this time. We have been unable to contact the patient.  Signature Julian Lemmings, LPN [facility] Nurse Health Advisor Direct Dial  [phone]

## 2023-10-24 ENCOUNTER — Telehealth: Payer: Self-pay

## 2023-10-24 ENCOUNTER — Other Ambulatory Visit: Payer: Self-pay | Admitting: Nurse Practitioner

## 2023-10-24 ENCOUNTER — Other Ambulatory Visit: Payer: Self-pay

## 2023-10-24 DIAGNOSIS — I5032 Chronic diastolic (congestive) heart failure: Secondary | ICD-10-CM

## 2023-10-24 MED ORDER — TOUJEO MAX SOLOSTAR 300 UNIT/ML ~~LOC~~ SOPN
60.0000 [IU] | PEN_INJECTOR | Freq: Every morning | SUBCUTANEOUS | 2 refills | Status: DC
  Filled 2023-10-24 – 2023-10-25 (×2): qty 3, 15d supply, fill #0
  Filled 2023-11-12 – 2024-03-09 (×2): qty 3, 15d supply, fill #1

## 2023-10-24 MED ORDER — SPIRONOLACTONE 25 MG PO TABS
25.0000 mg | ORAL_TABLET | Freq: Every day | ORAL | 0 refills | Status: DC
  Filled 2023-10-24 – 2023-10-27 (×2): qty 30, 30d supply, fill #0

## 2023-10-24 MED ORDER — HYDRALAZINE HCL 25 MG PO TABS
25.0000 mg | ORAL_TABLET | Freq: Three times a day (TID) | ORAL | 0 refills | Status: DC
  Filled 2023-10-24 – 2023-10-27 (×2): qty 90, 30d supply, fill #0

## 2023-10-24 MED ORDER — METOPROLOL SUCCINATE ER 100 MG PO TB24
100.0000 mg | ORAL_TABLET | Freq: Every day | ORAL | 0 refills | Status: DC
  Filled 2023-10-24 – 2023-10-27 (×2): qty 30, 30d supply, fill #0

## 2023-10-24 NOTE — Telephone Encounter (Signed)
 Copied from CRM [id number]. Topic: Clinical - Prescription Issue >> Oct 24, 2023  1:43 PM Heather  M wrote: Reason for CRM: Patient states her medication she was prescribed is not strong enough and she has also been using Dulcolax over the counter, she is wanting to know if she can get a stronger prescription because this is not working for her due to taking pain medication.  Pt was called to advise we need to at least have a virtual visit . She  has not been seen in a while and no future appt. KH

## 2023-10-24 NOTE — Telephone Encounter (Signed)
 Copied from CRM [id number]. Topic: Clinical - Prescription Issue >> Oct 24, 2023  1:45 PM Heather  M wrote: Reason for CRM: Patient states that her blood pressure cuff was sent over the pharmacy but she said they are charging her way too much. She would like to know if she can be send over to another pharmacy that will charge less. She said she thinks it needs to be run through a home health service rather than the pharmacy. Please call to advise. She is needing a lot of medical supplies that are out of her cost range.  Is this something you can help with? KH

## 2023-10-24 NOTE — Telephone Encounter (Signed)
 Pt was called and advise of the need for an appt. No answer. LVM it has been a year since she was seen. KH

## 2023-10-24 NOTE — Telephone Encounter (Signed)
 Medicare does not cover home blood pressure machines. I would advise her to call her insurance first and see if they have any programs, like something called an Over the Counter Benefits, that she could use to receive a home blood pressure cuff.   If her insurance tells her that there is not a program, tell her to let us  know.

## 2023-10-25 ENCOUNTER — Other Ambulatory Visit: Payer: Self-pay | Admitting: Nurse Practitioner

## 2023-10-25 ENCOUNTER — Other Ambulatory Visit: Payer: Self-pay

## 2023-10-25 MED ORDER — EMPAGLIFLOZIN 10 MG PO TABS
10.0000 mg | ORAL_TABLET | Freq: Every day | ORAL | 0 refills | Status: DC
  Filled 2023-10-25 – 2023-11-01 (×2): qty 30, 30d supply, fill #0
  Filled ????-??-??: fill #0

## 2023-10-25 NOTE — Telephone Encounter (Signed)
Sent pt a my chart message . KH 

## 2023-10-25 NOTE — Telephone Encounter (Signed)
Sent pt my chart message. Kh

## 2023-10-26 ENCOUNTER — Encounter: Payer: Medicare Other | Admitting: Physical Medicine and Rehabilitation

## 2023-10-26 ENCOUNTER — Encounter: Payer: Self-pay | Admitting: Pharmacist

## 2023-10-27 ENCOUNTER — Other Ambulatory Visit: Payer: Self-pay | Admitting: Nurse Practitioner

## 2023-10-29 ENCOUNTER — Other Ambulatory Visit: Payer: Self-pay

## 2023-10-29 ENCOUNTER — Other Ambulatory Visit (HOSPITAL_COMMUNITY): Payer: Self-pay

## 2023-10-29 MED ORDER — DICYCLOMINE HCL 20 MG PO TABS
20.0000 mg | ORAL_TABLET | Freq: Two times a day (BID) | ORAL | 3 refills | Status: DC
  Filled 2023-10-29: qty 60, 30d supply, fill #0

## 2023-10-29 MED ORDER — POTASSIUM CHLORIDE CRYS ER 20 MEQ PO TBCR
20.0000 meq | EXTENDED_RELEASE_TABLET | Freq: Every day | ORAL | 0 refills | Status: DC
  Filled 2023-10-29: qty 30, 30d supply, fill #0

## 2023-10-30 ENCOUNTER — Other Ambulatory Visit: Payer: Self-pay

## 2023-10-30 ENCOUNTER — Telehealth: Payer: Self-pay | Admitting: Adult Health

## 2023-10-30 NOTE — Telephone Encounter (Signed)
 Patient has not returned the HST box. Have attempted to call several times to receive it back. Will Attempt to contact again

## 2023-11-01 ENCOUNTER — Other Ambulatory Visit: Payer: Self-pay

## 2023-11-02 ENCOUNTER — Other Ambulatory Visit: Payer: Self-pay

## 2023-11-05 ENCOUNTER — Other Ambulatory Visit: Payer: Self-pay

## 2023-11-06 ENCOUNTER — Ambulatory Visit: Payer: Medicaid Other | Admitting: Certified Nurse Midwife

## 2023-11-07 ENCOUNTER — Encounter (HOSPITAL_BASED_OUTPATIENT_CLINIC_OR_DEPARTMENT_OTHER): Payer: Self-pay | Admitting: Emergency Medicine

## 2023-11-07 ENCOUNTER — Other Ambulatory Visit: Payer: Self-pay

## 2023-11-07 ENCOUNTER — Other Ambulatory Visit (HOSPITAL_COMMUNITY): Payer: Self-pay

## 2023-11-07 ENCOUNTER — Emergency Department (HOSPITAL_BASED_OUTPATIENT_CLINIC_OR_DEPARTMENT_OTHER)
Admission: EM | Admit: 2023-11-07 | Discharge: 2023-11-07 | Discharge status: Against Medical Advice | Payer: Medicare Other | Attending: Emergency Medicine | Admitting: Emergency Medicine

## 2023-11-07 ENCOUNTER — Institutional Professional Consult (permissible substitution): Payer: Medicare Other | Admitting: Adult Health

## 2023-11-07 DIAGNOSIS — E1165 Type 2 diabetes mellitus with hyperglycemia: Secondary | ICD-10-CM | POA: Insufficient documentation

## 2023-11-07 DIAGNOSIS — I129 Hypertensive chronic kidney disease with stage 1 through stage 4 chronic kidney disease, or unspecified chronic kidney disease: Secondary | ICD-10-CM | POA: Insufficient documentation

## 2023-11-07 DIAGNOSIS — E1122 Type 2 diabetes mellitus with diabetic chronic kidney disease: Secondary | ICD-10-CM | POA: Insufficient documentation

## 2023-11-07 DIAGNOSIS — R739 Hyperglycemia, unspecified: Secondary | ICD-10-CM | POA: Diagnosis present

## 2023-11-07 DIAGNOSIS — N189 Chronic kidney disease, unspecified: Secondary | ICD-10-CM | POA: Diagnosis not present

## 2023-11-07 DIAGNOSIS — G894 Chronic pain syndrome: Secondary | ICD-10-CM | POA: Diagnosis not present

## 2023-11-07 LAB — BASIC METABOLIC PANEL
Anion gap: 11 (ref 5–15)
BUN: 19 mg/dL (ref 6–20)
CO2: 27 mmol/L (ref 22–32)
Calcium: 9.2 mg/dL (ref 8.9–10.3)
Chloride: 99 mmol/L (ref 98–111)
Creatinine, Ser: 1.78 mg/dL — ABNORMAL HIGH (ref 0.44–1.00)
GFR, Estimated: 35 mL/min — ABNORMAL LOW (ref >60)
Glucose, Bld: 470 mg/dL — ABNORMAL HIGH (ref 70–99)
Potassium: 4.3 mmol/L (ref 3.5–5.1)
Sodium: 137 mmol/L (ref 135–145)

## 2023-11-07 LAB — CBC WITH DIFFERENTIAL/PLATELET
Abs Immature Granulocytes: 0.04 10*3/uL (ref 0.00–0.07)
Basophils Absolute: 0 10*3/uL (ref 0.0–0.1)
Basophils Relative: 0 %
Eosinophils Absolute: 0.2 10*3/uL (ref 0.0–0.5)
Eosinophils Relative: 2 %
HCT: 44.6 % (ref 36.0–46.0)
Hemoglobin: 15 g/dL (ref 12.0–15.0)
Immature Granulocytes: 0 %
Lymphocytes Relative: 27 %
Lymphs Abs: 3.3 10*3/uL (ref 0.7–4.0)
MCH: 27 pg (ref 26.0–34.0)
MCHC: 33.6 g/dL (ref 30.0–36.0)
MCV: 80.4 fL (ref 80.0–100.0)
Monocytes Absolute: 0.8 10*3/uL (ref 0.1–1.0)
Monocytes Relative: 6 %
Neutro Abs: 7.9 10*3/uL — ABNORMAL HIGH (ref 1.7–7.7)
Neutrophils Relative %: 65 %
Platelets: 216 10*3/uL (ref 150–400)
RBC: 5.55 MIL/uL — ABNORMAL HIGH (ref 3.87–5.11)
RDW: 18.4 % — ABNORMAL HIGH (ref 11.5–15.5)
WBC: 12.2 10*3/uL — ABNORMAL HIGH (ref 4.0–10.5)
nRBC: 0 % (ref 0.0–0.2)

## 2023-11-07 MED ORDER — ACETAMINOPHEN 500 MG PO TABS
1000.0000 mg | ORAL_TABLET | Freq: Once | ORAL | Status: AC
  Administered 2023-11-07: 1000 mg via ORAL
  Filled 2023-11-07: qty 2

## 2023-11-07 MED ORDER — INSULIN ASPART 100 UNIT/ML IJ SOLN
10.0000 [IU] | Freq: Once | INTRAMUSCULAR | Status: AC
  Administered 2023-11-07: 10 [IU] via SUBCUTANEOUS

## 2023-11-07 MED ORDER — OXYCODONE HCL 5 MG PO TABS
10.0000 mg | ORAL_TABLET | Freq: Once | ORAL | Status: AC
  Administered 2023-11-07: 10 mg via ORAL
  Filled 2023-11-07: qty 2

## 2023-11-07 NOTE — ED Notes (Signed)
Pt advised she still unable to give urine sample but she will work on getting [place] a sample. Pt was provided water to keep drinking.

## 2023-11-07 NOTE — ED Notes (Addendum)
Was informed by paramedic Robin pt eloped. Did not informed staff she was leaving. Pt not in bathrooms. IV not removed by staff

## 2023-11-07 NOTE — ED Notes (Signed)
Pt stated she is unable to give urine sample at this time.  

## 2023-11-07 NOTE — ED Provider Notes (Signed)
EMERGENCY DEPARTMENT AT [facility] Provider Note   CSN: [id number] Arrival date & time: 11/07/23  1639     History  Chief Complaint  Patient presents with   Multiple Complaints    Jill Shaw is a 48 y.o. female.  The history is provided by the patient and medical records. No language interpreter was used.     48 year old female with history of of diabetes, GERD, hypertension, fibromyalgia, CKD, gastroparesis, IBS presenting with multiple complaints.  Patient endorses having pain throughout her abdomen, back, and down to both her legs ongoing for the past 2 days.  Pain is sharp and achy throbbing worse with movement.  She mention she has pain like this in the past and has been dealing with it for more than 5 years.  Her doctor prescribed medication for which she takes but it provided no relief.  She when tries to take her Percocet at home without relief.  She does not endorse any fever or chills no runny nose sneezing or coughing no nausea vomiting diarrhea no dysuria no focal numbness or focal weakness.  No specific trauma and no inciting factors.  Patient denies any strenuous activities or heavy lifting.  States she is on disability and does not work and therefore no repetitive movement.  Home Medications Prior to Admission medications   Medication Sig Start Date End Date Taking? Authorizing Provider  Accu-Chek Softclix Lancets lancets USE TO TEST AS DIRECTED UP TO FOUR TIMES DAILY. 03/06/22   Shamleffer, Konrad Dolores, MD  albuterol (VENTOLIN HFA) 108 (90 Base) MCG/ACT inhaler Inhale 2 puffs into the lungs every 6 (six) hours as needed for wheezing or shortness of breath. 09/10/23   Parrett, Virgel Bouquet, NP  B-D ULTRAFINE III SHORT PEN 31G X 8 MM MISC USE AS DIRECTED EVERY MORNING, NOON, EVENING, AND AT BEDTIME 07/10/22   Shamleffer, Konrad Dolores, MD  Blood Pressure Monitoring (BLOOD PRESSURE KIT) KIT 1 each by Does not apply route daily. 07/20/23   Ivonne Andrew, NP  clindamycin (CLEOCIN) 300 MG capsule Take 1 capsule (300 mg total) by mouth 4 (four) times daily. X 7 days 09/08/23   Geoffery Lyons, MD  Continuous Blood Gluc Sensor (FREESTYLE LIBRE 3 SENSOR) MISC Place 1 sensor on the skin every 14 days. Use to check glucose continuously 07/06/22   Ivonne Andrew, NP  Dexlansoprazole 30 MG capsule DR TAKE 1 CAPSULE (30 MG TOTAL) BY MOUTH DAILY. NEEDS OFFICE VISIT FOR ADDITIONAL REFILLS Patient taking differently: Take 30 mg by mouth daily as needed (for acid reflux). NEEDS OFFICE VISIT FOR ADDITIONAL REFILLS 03/26/23   Ivonne Andrew, NP  diclofenac Sodium (VOLTAREN) 1 % GEL Apply 2 to 4 gram onto painful sites up to 4 times daily if needed, max daily dose: 32 Gram 10/13/23     dicyclomine (BENTYL) 20 MG tablet Take 1 tablet (20 mg total) by mouth 2 (two) times daily. 10/29/23   Ivonne Andrew, NP  empagliflozin (JARDIANCE) 10 MG TABS tablet Take 1 tablet (10 mg total) by mouth daily. 10/25/23   Ivonne Andrew, NP  furosemide (LASIX) 40 MG tablet Take 1 tablet (40 mg total) by mouth daily. 07/31/23   Zannie Cove, MD  glucose blood (ACCU-CHEK GUIDE) test strip USE TO TEST BLOOD GLUCOSE AS DIRECTED UP TO 4 TIMES DAILY 11/14/22   Ivonne Andrew, NP  HUMALOG KWIKPEN 200 UNIT/ML KwikPen Inject 50 Units into the skin 3 (three) times daily before meals. 10/11/23  Ivonne Andrew, NP  hydrALAZINE (APRESOLINE) 25 MG tablet Take 1 tablet (25 mg total) by mouth 3 (three) times daily. 10/24/23   Ivonne Andrew, NP  melatonin 3 MG TABS tablet Take 1 tablet (3 mg total) by mouth at bedtime. 08/13/23   Ivonne Andrew, NP  metoCLOPramide (REGLAN) 10 MG tablet Take 1 tablet (10 mg total) by mouth every 8 (eight) hours as needed for nausea. 10/07/23   Rancour, Jeannett Senior, MD  metoprolol succinate (TOPROL-XL) 100 MG 24 hr tablet Take 1 tablet (100 mg total) by mouth daily. Take with or immediately following a meal. 10/24/23   Ivonne Andrew, NP  naloxone [facility])  nasal spray 4 mg/0.1 mL Insert 1 spray into one nostril if poorly responding / turning blue. CALL 911 ASAP 10/13/23     nicotine (NICODERM CQ - DOSED IN MG/24 HOURS) 21 mg/24hr patch Place 1 patch (21 mg total) onto the skin daily. 10/11/23 10/10/24  Ivonne Andrew, NP  nicotine polacrilex (GOODSENSE NICOTINE) 2 MG gum Take 1 each (2 mg total) by mouth as needed for smoking cessation. 09/26/23   Ivonne Andrew, NP  nitroGLYCERIN (NITROSTAT) 0.4 MG SL tablet Place 1 tablet (0.4 mg total) under the tongue every 5 (five) minutes as needed for chest pain. Additional refills to be filled by PCP, patient aware 09/06/23   Ivonne Andrew, NP  norethindrone (AYGESTIN) 5 MG tablet Take 1 tablet (5 mg total) by mouth 2 (two) times daily. 08/14/23   Lorriane Shire, MD  ondansetron (ZOFRAN) 4 MG tablet Take 1 tablet (4 mg total) by mouth every 8 (eight) hours as needed for nausea or vomiting. 06/06/23   Ivonne Andrew, NP  Oxycodone HCl 10 MG TABS Take 1 tablet (10 mg total) by mouth every 6 (six) hours as needed for severe pain (pain score 7-10) or moderate pain (pain score 4-6). 07/31/23   Zannie Cove, MD  Oxycodone HCl 10 MG TABS Take 1 tablet (10 mg total) by mouth 4 (four) times daily as needed. 09/12/23     Oxycodone HCl 10 MG TABS Take 1 tablet (10 mg total) by mouth 4 (four) times daily as needed for pain. 10/11/23     Oxycodone HCl 10 MG TABS Take 1 tablet (10 mg total) by mouth 4 (four) times daily as needed for pain 10/13/23     Pimozide 1 MG TABS Take 1 mg by mouth daily as needed (feeling of facial itching). 02/10/23   [provider]  polyethylene glycol (MIRALAX) 17 g packet Take 17 g by mouth daily as needed. 08/07/23   Zehr, Shanda Bumps D, PA-C  potassium chloride SA (KLOR-CON M) 20 MEQ tablet Take 1 tablet (20 mEq total) by mouth daily. 10/29/23   Ivonne Andrew, NP  pregabalin (LYRICA) 75 MG capsule Take 1 capsule (75 mg total) by mouth 2 (two) times daily. 10/13/23      rosuvastatin (CRESTOR) 20 MG tablet TAKE 1 TABLET(20 MG) BY MOUTH DAILY 01/19/23   Patwardhan, Manish J, MD  senna-docusate (SENOKOT-S) 8.6-50 MG tablet Take 1 tablet by mouth daily. 08/06/23   Ivonne Andrew, NP  spironolactone (ALDACTONE) 25 MG tablet Take 1 tablet (25 mg total) by mouth daily. 10/24/23   Ivonne Andrew, NP  sucralfate (CARAFATE) 1 g tablet Take 1 tablet (1 g total) by mouth 4 (four) times daily -  with meals and at bedtime. 08/13/23   Zehr, Princella Pellegrini, PA-C  budesonide-formoterol (SYMBICORT) 160-4.5 MCG/ACT inhaler Inhale  2 puffs into the lungs 2 (two) times daily. INHALE 2 PUFFS INTO THE LUNGS TWICE DAILY 09/10/23   Parrett, Tammy S, NP  TOUJEO MAX SOLOSTAR 300 UNIT/ML Solostar Pen Inject 40 Units into the skin in the morning. 07/31/23   Zannie Cove, MD  TOUJEO MAX SOLOSTAR 300 UNIT/ML Solostar Pen Inject 60 Units into the skin in the morning. 10/24/23   Ivonne Andrew, NP      Allergies    Elavil [amitriptyline], Orudis [ketoprofen], Desyrel [trazodone], Tylenol [acetaminophen], Aspirin, Motrin [ibuprofen], Naprosyn [naproxen], Neurontin [gabapentin], Sulfa antibiotics, Ultram [tramadol], Victoza [liraglutide], and Zegerid [omeprazole-sodium bicarbonate]    Review of Systems   Review of Systems  All other systems reviewed and are negative.   Physical Exam Updated Vital Signs BP (!) 160/91 (BP Location: Right Arm)   Pulse 78   Temp 98.2 F (36.8 C)   Resp 18   SpO2 100%  Physical Exam Vitals and nursing note reviewed.  Constitutional:      General: She is not in acute distress.    Appearance: She is well-developed. She is obese.     Comments: Patient appears tearful but nontoxic  HENT:     Head: Atraumatic.  Eyes:     Conjunctiva/sclera: Conjunctivae normal.  Cardiovascular:     Rate and Rhythm: Normal rate and regular rhythm.     Pulses: Normal pulses.     Heart sounds: Normal heart sounds.  Pulmonary:     Effort: Pulmonary effort is normal.  Chest:      Chest wall: Tenderness present.  Abdominal:     Palpations: Abdomen is soft.     Tenderness: There is abdominal tenderness.  Musculoskeletal:        General: Tenderness (Patient exhibits tenderness throughout entire body with even gentle palpation.) present.     Cervical back: Neck supple.  Skin:    Findings: No rash.  Neurological:     Mental Status: She is alert.  Psychiatric:        Mood and Affect: Mood normal.     ED Results / Procedures / Treatments   Labs (all labs ordered are listed, but only abnormal results are displayed) Labs Reviewed  BASIC METABOLIC PANEL - Abnormal; Notable for the following components:      Result Value   Glucose, Bld 470 (*)    Creatinine, Ser 1.78 (*)    GFR, Estimated 35 (*)    All other components within normal limits  CBC WITH DIFFERENTIAL/PLATELET - Abnormal; Notable for the following components:   WBC 12.2 (*)    RBC 5.55 (*)    RDW 18.4 (*)    Neutro Abs 7.9 (*)    All other components within normal limits  PREGNANCY, URINE  URINALYSIS, ROUTINE W REFLEX MICROSCOPIC    EKG None  Radiology No results found.  Procedures Procedures    Medications Ordered in ED Medications  acetaminophen (TYLENOL) tablet 1,000 mg (1,000 mg Oral Given 11/07/23 1722)  oxyCODONE (Oxy IR/ROXICODONE) immediate release tablet 10 mg (10 mg Oral Given 11/07/23 1830)  insulin aspart (novoLOG) injection 10 Units (10 Units Subcutaneous Given 11/07/23 1832)    ED Course/ Medical Decision Making/ A&P                                 Medical Decision Making Amount and/or Complexity of Data Reviewed Labs: ordered.  Risk OTC drugs.   BP (!) 160/91 (BP Location:  Right Arm)   Pulse 78   Temp 98.2 F (36.8 C)   Resp 18   SpO2 100%   34:8 PM  48 year old female with history of of diabetes, GERD, hypertension, fibromyalgia, CKD, gastroparesis, IBS presenting with multiple complaints.  Patient endorses having pain throughout her abdomen, back, and  down to both her legs ongoing for the past 2 days.  Pain is sharp and achy throbbing worse with movement.  She mention she has pain like this in the past and has been dealing with it for more than 5 years.  Her doctor prescribed medication for which she takes but it provided no relief.  She when tries to take her Percocet at home without relief.  She does not endorse any fever or chills no runny nose sneezing or coughing no nausea vomiting diarrhea no dysuria no focal numbness or focal weakness.  No specific trauma and no inciting factors.  Patient denies any strenuous activities or heavy lifting.  States she is on disability and does not work and therefore no repetitive movement.  On exam this is an obese female, sitting in her bed, tearful but nontoxic in appearance.  Exam notable for tenderness throughout entire body with gentle palpation.  No focal point tenderness no deformity noted.  She is neurovascular tact throughout upper and lower extremities.  Her pain is likely acute on chronic pain without any inciting factor.  Her vital signs notable for elevated blood pressure 160/91.  She is afebrile, no hypoxia.  Labs notable for elevated CBG of 470 with normal anion gap and no evidence of DKA.  Patient does have a creatinine of 1.78 reflecting signs of dehydration.  Her white count is mildly elevated at 12.2.  Unfortunately after patient received her pain medication she eloped.  No acute emergent condition noted        Final Clinical Impression(s) / ED Diagnoses Final diagnoses:  Hyperglycemia  Chronic pain syndrome    Rx / DC Orders ED Discharge Orders     None         Fayrene Helper, PA-C 11/07/23 1921    Royanne Foots, DO 11/12/23 1144

## 2023-11-07 NOTE — Telephone Encounter (Signed)
Per [name] on 1/20, pt returned the HST box.

## 2023-11-07 NOTE — ED Notes (Signed)
Attempted to contact pt at [phone] without success.

## 2023-11-07 NOTE — ED Triage Notes (Signed)
Pt c/o generalized ABD pain, back pain and BLE pain x 2 days

## 2023-11-07 NOTE — ED Notes (Signed)
Pt was noticed not to be in her room. Pt was asked to give a urine sample and assumed Pt was in the restroom. After checking room for Pt and urine sample noticed Pt still was not in her room. Staff checked all the bathrooms and Pt was not in any of them. Checked with radiology even though she had no scans ordered. Advised Pt's provider that Pt eloped.  Had security check camera to see if Pt left due to Pt still had an Saline Lock in right AC. Pt was seen leaving however she was wearing a long sleeve shirt that made it impossible to see if Boykin Reaper was still there. Charge RN was also made aware of same.

## 2023-11-08 ENCOUNTER — Other Ambulatory Visit: Payer: Self-pay

## 2023-11-09 ENCOUNTER — Ambulatory Visit: Payer: Medicare Other | Admitting: Adult Health

## 2023-11-09 ENCOUNTER — Other Ambulatory Visit: Payer: Self-pay

## 2023-11-12 ENCOUNTER — Other Ambulatory Visit: Payer: Self-pay

## 2023-11-12 ENCOUNTER — Other Ambulatory Visit: Payer: Self-pay | Admitting: Nurse Practitioner

## 2023-11-13 ENCOUNTER — Telehealth: Payer: Self-pay | Admitting: Nurse Practitioner

## 2023-11-13 ENCOUNTER — Other Ambulatory Visit: Payer: Self-pay | Admitting: Nurse Practitioner

## 2023-11-13 ENCOUNTER — Other Ambulatory Visit: Payer: Self-pay

## 2023-11-13 DIAGNOSIS — I5032 Chronic diastolic (congestive) heart failure: Secondary | ICD-10-CM

## 2023-11-13 MED ORDER — HYDRALAZINE HCL 25 MG PO TABS
25.0000 mg | ORAL_TABLET | Freq: Three times a day (TID) | ORAL | 0 refills | Status: DC
  Filled 2023-11-13 – 2023-12-03 (×3): qty 90, 30d supply, fill #0
  Filled ????-??-??: fill #0

## 2023-11-13 MED ORDER — METAMUCIL SMOOTH TEXTURE 58.6 % PO POWD
1.0000 | Freq: Three times a day (TID) | ORAL | 12 refills | Status: DC
  Filled 2023-11-13: qty 283, fill #0

## 2023-11-13 NOTE — Telephone Encounter (Signed)
Copied from CRM [id number]. Topic: Clinical - Medication Question >> Nov 13, 2023  8:55 AM Prudencio Pair wrote: Reason for CRM: Patient states she use to be on Metamucil & wants to know if Dr. Tanda Rockers can write her a prescription for it so that her insurance will pay for it. Please give patient a call back to advise if there are any questions or concerns. CB #: [id number].

## 2023-11-14 ENCOUNTER — Other Ambulatory Visit (HOSPITAL_COMMUNITY): Payer: Self-pay

## 2023-11-14 ENCOUNTER — Other Ambulatory Visit: Payer: Self-pay

## 2023-11-14 ENCOUNTER — Other Ambulatory Visit (HOSPITAL_BASED_OUTPATIENT_CLINIC_OR_DEPARTMENT_OTHER): Payer: Self-pay

## 2023-11-14 DIAGNOSIS — E1140 Type 2 diabetes mellitus with diabetic neuropathy, unspecified: Secondary | ICD-10-CM | POA: Diagnosis not present

## 2023-11-14 DIAGNOSIS — M654 Radial styloid tenosynovitis [de Quervain]: Secondary | ICD-10-CM | POA: Diagnosis not present

## 2023-11-14 DIAGNOSIS — Z79899 Other long term (current) drug therapy: Secondary | ICD-10-CM | POA: Diagnosis not present

## 2023-11-14 DIAGNOSIS — M2550 Pain in unspecified joint: Secondary | ICD-10-CM | POA: Diagnosis not present

## 2023-11-14 DIAGNOSIS — Z6841 Body Mass Index (BMI) 40.0 and over, adult: Secondary | ICD-10-CM | POA: Diagnosis not present

## 2023-11-14 MED ORDER — PREGABALIN 50 MG PO CAPS
50.0000 mg | ORAL_CAPSULE | Freq: Every day | ORAL | 0 refills | Status: DC
  Filled 2023-11-14 – 2023-11-15 (×5): qty 30, 30d supply, fill #0

## 2023-11-14 MED ORDER — OXYCODONE HCL 10 MG PO TABS
10.0000 mg | ORAL_TABLET | ORAL | 0 refills | Status: DC | PRN
  Filled 2023-11-14: qty 120, 20d supply, fill #0

## 2023-11-14 MED ORDER — SPIRONOLACTONE 25 MG PO TABS
25.0000 mg | ORAL_TABLET | Freq: Every day | ORAL | 0 refills | Status: DC
  Filled 2023-11-14: qty 30, 30d supply, fill #0
  Filled ????-??-??: fill #0

## 2023-11-14 MED ORDER — DICLOFENAC SODIUM 1 % EX GEL
CUTANEOUS | 0 refills | Status: DC | PRN
  Filled 2023-11-14: qty 32, 30d supply, fill #0

## 2023-11-15 ENCOUNTER — Other Ambulatory Visit: Payer: Self-pay

## 2023-11-16 ENCOUNTER — Other Ambulatory Visit: Payer: Self-pay | Admitting: Obstetrics and Gynecology

## 2023-11-16 ENCOUNTER — Other Ambulatory Visit: Payer: Self-pay

## 2023-11-16 DIAGNOSIS — N939 Abnormal uterine and vaginal bleeding, unspecified: Secondary | ICD-10-CM

## 2023-11-19 ENCOUNTER — Other Ambulatory Visit: Payer: Self-pay

## 2023-11-19 ENCOUNTER — Ambulatory Visit: Payer: Self-pay | Admitting: Nurse Practitioner

## 2023-11-19 MED ORDER — NORETHINDRONE ACETATE 5 MG PO TABS
5.0000 mg | ORAL_TABLET | Freq: Two times a day (BID) | ORAL | 1 refills | Status: DC
  Filled 2023-11-19: qty 90, 45d supply, fill #0
  Filled 2023-12-26: qty 90, 45d supply, fill #1
  Filled ????-??-??: fill #1

## 2023-11-19 NOTE — Telephone Encounter (Signed)
  Chief Complaint: Urinalysis Request Symptoms: None Frequency: None Pertinent Negatives: Patient denies symptoms Disposition: [] ED /[] Urgent Care (no appt availability in office) / [x] Appointment(In office/virtual)/ []  [facility]/ [] Home Care/ [] Refused Recommended Disposition /[] [facility]/ []  Follow-up with PCP Additional Notes: Jill Shaw is a 48 year old female requesting to drop off a urine sample. The patient denies having any symptoms and is strictly wanting to drop off a urine sample. Connected to [facility] for clarification. The patient needs a hospital follow up appointment. If an earlier appointment arises, the patient would like to be notified.   Reason for Disposition  Requesting regular office appointment  Answer Assessment - Initial Assessment Questions 1. REASON FOR CALL or QUESTION: "What is your reason for calling today?" or "How can I best help you?" or "What question do you have that I can help answer?"     "I need to drop off a urine sample"  Protocols used: Information Only Call - No Triage-A-AH

## 2023-11-21 ENCOUNTER — Other Ambulatory Visit: Payer: Self-pay

## 2023-11-25 ENCOUNTER — Other Ambulatory Visit: Payer: Self-pay | Admitting: Emergency Medicine

## 2023-11-25 ENCOUNTER — Encounter: Payer: Self-pay | Admitting: Physical Medicine and Rehabilitation

## 2023-11-25 ENCOUNTER — Other Ambulatory Visit: Payer: Self-pay | Admitting: Nurse Practitioner

## 2023-11-26 ENCOUNTER — Encounter: Payer: Self-pay | Admitting: Physical Medicine and Rehabilitation

## 2023-11-26 ENCOUNTER — Ambulatory Visit: Payer: Medicare Other | Admitting: Nurse Practitioner

## 2023-11-26 ENCOUNTER — Other Ambulatory Visit: Payer: Self-pay

## 2023-11-26 ENCOUNTER — Encounter: Payer: Self-pay | Admitting: Nurse Practitioner

## 2023-11-26 ENCOUNTER — Other Ambulatory Visit (HOSPITAL_COMMUNITY): Payer: Self-pay

## 2023-11-26 VITALS — BP 136/76 | HR 90 | Temp 97.9°F | Wt 275.8 lb

## 2023-11-26 DIAGNOSIS — J449 Chronic obstructive pulmonary disease, unspecified: Secondary | ICD-10-CM | POA: Diagnosis not present

## 2023-11-26 DIAGNOSIS — I10 Essential (primary) hypertension: Secondary | ICD-10-CM

## 2023-11-26 DIAGNOSIS — J42 Unspecified chronic bronchitis: Secondary | ICD-10-CM

## 2023-11-26 DIAGNOSIS — Z794 Long term (current) use of insulin: Secondary | ICD-10-CM

## 2023-11-26 DIAGNOSIS — I110 Hypertensive heart disease with heart failure: Secondary | ICD-10-CM

## 2023-11-26 DIAGNOSIS — E119 Type 2 diabetes mellitus without complications: Secondary | ICD-10-CM

## 2023-11-26 DIAGNOSIS — F17210 Nicotine dependence, cigarettes, uncomplicated: Secondary | ICD-10-CM

## 2023-11-26 DIAGNOSIS — E782 Mixed hyperlipidemia: Secondary | ICD-10-CM

## 2023-11-26 DIAGNOSIS — R32 Unspecified urinary incontinence: Secondary | ICD-10-CM | POA: Diagnosis not present

## 2023-11-26 DIAGNOSIS — I5032 Chronic diastolic (congestive) heart failure: Secondary | ICD-10-CM

## 2023-11-26 DIAGNOSIS — G894 Chronic pain syndrome: Secondary | ICD-10-CM

## 2023-11-26 LAB — POCT URINALYSIS DIP (CLINITEK)
Bilirubin, UA: NEGATIVE
Blood, UA: NEGATIVE
Glucose, UA: >=1000 mg/dL — AB
Leukocytes, UA: NEGATIVE
Nitrite, UA: NEGATIVE
POC PROTEIN,UA: NEGATIVE
Spec Grav, UA: 1.015 (ref 1.010–1.025)
Urobilinogen, UA: 0.2 U/dL
pH, UA: 5.5 (ref 5.0–8.0)

## 2023-11-26 LAB — POCT GLYCOSYLATED HEMOGLOBIN (HGB A1C): Hemoglobin A1C: 9.4 % — AB (ref 4.0–5.6)

## 2023-11-26 MED ORDER — EMPAGLIFLOZIN 10 MG PO TABS
10.0000 mg | ORAL_TABLET | Freq: Every day | ORAL | 0 refills | Status: DC
  Filled 2023-11-26 – 2024-03-09 (×3): qty 30, 30d supply, fill #0

## 2023-11-26 MED ORDER — METOPROLOL SUCCINATE ER 100 MG PO TB24
100.0000 mg | ORAL_TABLET | Freq: Every day | ORAL | 0 refills | Status: DC
  Filled 2023-11-26: qty 30, 30d supply, fill #0

## 2023-11-26 MED ORDER — ALBUTEROL SULFATE HFA 108 (90 BASE) MCG/ACT IN AERS
2.0000 | INHALATION_SPRAY | Freq: Four times a day (QID) | RESPIRATORY_TRACT | 3 refills | Status: AC | PRN
  Filled 2023-11-26 – 2024-03-09 (×2): qty 8.5, 25d supply, fill #0

## 2023-11-26 MED ORDER — TIZANIDINE HCL 4 MG PO TABS
4.0000 mg | ORAL_TABLET | Freq: Four times a day (QID) | ORAL | 0 refills | Status: DC | PRN
  Filled 2023-11-26: qty 30, 8d supply, fill #0

## 2023-11-26 MED ORDER — FUROSEMIDE 40 MG PO TABS
40.0000 mg | ORAL_TABLET | Freq: Every day | ORAL | 0 refills | Status: DC
  Filled 2023-11-26: qty 30, 30d supply, fill #0

## 2023-11-26 MED ORDER — ROSUVASTATIN CALCIUM 20 MG PO TABS
20.0000 mg | ORAL_TABLET | Freq: Every day | ORAL | 3 refills | Status: DC
  Filled 2023-11-26: qty 90, 90d supply, fill #0

## 2023-11-26 MED ORDER — SPIRONOLACTONE 25 MG PO TABS
25.0000 mg | ORAL_TABLET | Freq: Every day | ORAL | 0 refills | Status: DC
  Filled 2023-11-26: qty 30, 30d supply, fill #0

## 2023-11-26 MED ORDER — HUMALOG KWIKPEN 200 UNIT/ML ~~LOC~~ SOPN
50.0000 [IU] | PEN_INJECTOR | Freq: Three times a day (TID) | SUBCUTANEOUS | 0 refills | Status: DC
  Filled 2023-11-26 – 2023-12-17 (×2): qty 60, 80d supply, fill #0

## 2023-11-26 MED ORDER — BUDESONIDE-FORMOTEROL FUMARATE 160-4.5 MCG/ACT IN AERO
2.0000 | INHALATION_SPRAY | Freq: Two times a day (BID) | RESPIRATORY_TRACT | 5 refills | Status: AC
  Filled 2023-11-26 – 2024-02-08 (×2): qty 10.2, 30d supply, fill #0
  Filled 2024-03-09: qty 10.2, 30d supply, fill #1
  Filled 2024-06-20: qty 10.2, 30d supply, fill #2
  Filled 2024-08-18: qty 10.2, 30d supply, fill #3
  Filled 2024-10-16: qty 10.2, 30d supply, fill #4

## 2023-11-26 MED ORDER — DICYCLOMINE HCL 20 MG PO TABS
20.0000 mg | ORAL_TABLET | Freq: Two times a day (BID) | ORAL | 3 refills | Status: AC
  Filled 2023-11-26 – 2024-03-09 (×2): qty 60, 30d supply, fill #0
  Filled 2024-06-20: qty 60, 30d supply, fill #1

## 2023-11-26 NOTE — Progress Notes (Signed)
 Subjective   Patient ID: Jill Shaw, female    DOB: 04-28-1976, 48 y.o.   MRN: [id number]  Chief Complaint  Patient presents with   Hospitalization Follow-up    I want to get back on my muscle relaxer and up her BP medication     Referring provider: [name], NP  Jill Shaw is a 48 y.o. female with Past Medical History: No date: Abnormal uterine bleeding (AUB) No date: Arthritis     Comment:  knees, hands No date: Atypical chest pain     Comment:  cardiology--- dr Filiberto Hug did cardiac cath 08-30-2021               showed minimal luminal irregularity involving LAD all               other coronaries w/ normal  flow No date: Chronic diastolic (congestive) heart failure (HCC)     Comment:  cardiologist---- dr Filiberto Hug;  preserved ef No date: Chronic iron  deficiency anemia No date: Chronic pain syndrome     Comment:  followed by pain management--- dr Alessandra Ancona No date: CKD (chronic kidney disease), stage III (HCC) No date: Diabetic gastroparesis (HCC) No date: Diabetic peripheral neuropathy (HCC) No date: Fibromyalgia No date: GAD (generalized anxiety disorder) No date: Generalized abdominal pain No date: GERD (gastroesophageal reflux disease) 03/21/2023: History of acute renal failure     Comment:  admission in epic due to N/V/D due ot severe sepsis POA               due to UTI No date: History of chronic gastritis     Comment:  inflammatory No date: History of diabetic ketoacidosis     Comment:  multiple admission's last 3 in epic 06/ 2022;  01/ 2022;              09/ 2021 01/2016: History of seizure     Comment:  hypoglycemic seizure 12/2020: History of vertebral compression fracture     Comment:  T11 -- T12 & L1 No date: Hyperlipidemia No date: Hypertension     Comment:  followed by pcp No date: Insulin  dependent type 2 diabetes mellitus (HCC)     Comment:  uncontrolled,  followed by pcp No date: Irritable bowel syndrome with constipation No  date: MDD (major depressive disorder) No date: Moderate COPD (chronic obstructive pulmonary disease) (HCC)     Comment:  pulmology--- dr Thelda Finney No date: Moderate persistent asthma No date: Sickle cell trait (HCC) 10/2019: Vitamin D  deficiency No date: Wears glasses   HPI  Patient presents today for an ED follow-up.  Has not been seen in this office since August 2024.  Went to the ED for pain but left AMA.  This was on 11/07/2023.  Glucose in the ED was 470. A1c in office today is 9.4. Will place referral to endocrinology for further evaluation and management.  Patient is noncompliant with medications.  Patient is requesting referral for second opinion for a different pain specialist.  Will place referral today.  Patient has a history of chronic pain syndrome.  Patient would also like urine checked today.  She states that she has been having UTI symptoms with urgency and frequency over the past few days.  We will check UA today.  Patient is also requesting incontinence pads for her bed at night.  She does have a history of urinary incontinence.  Denies f/c/s, n/v/d, hemoptysis, PND, leg swelling Denies chest pain or edema   Allergies  Allergen Reactions   Elavil  [Amitriptyline ] Other (See Comments)    Coma   Orudis [Ketoprofen] Nausea And Vomiting   Desyrel  [Trazodone ] Nausea And Vomiting   Tylenol  [Acetaminophen ] Nausea And Vomiting   Aspirin  Nausea Only   Motrin  [Ibuprofen ] Nausea And Vomiting   Naprosyn [Naproxen] Nausea And Vomiting   Neurontin [Gabapentin] Nausea And Vomiting and Other (See Comments)    upset stomach   Sulfa Antibiotics Nausea And Vomiting   Ultram  [Tramadol ] Nausea And Vomiting and Other (See Comments)    stomach upset   Victoza [Liraglutide] Nausea And Vomiting   Zegerid [Omeprazole -Sodium Bicarbonate ] Nausea And Vomiting    Immunization History  Administered Date(s) Administered   Influenza, Seasonal, Injecte, Preservative Fre 07/30/2023   Influenza,inj,Quad  PF,6+ Mos 09/03/2018, 07/03/2019, 07/04/2020, 07/01/2021   PNEUMOCOCCAL CONJUGATE-20 07/30/2023   Pneumococcal Polysaccharide-23 03/28/2015, 07/04/2020   Tdap 03/27/2016   Unspecified SARS-COV-2 Vaccination 03/25/2020, 05/05/2020    Tobacco History: Social History   Tobacco Use  Smoking Status Some Days   Current packs/day: 0.00   Average packs/day: 0.3 packs/day for 26.0 years (6.5 ttl pk-yrs)   Types: Cigarettes   Start date: 09/13/1994   Last attempt to quit: 09/13/2020   Years since quitting: 3.2  Smokeless Tobacco Never  Tobacco Comments   4-5 cigarettes/day   Pt smokes 1/2 ppd. AB. CMA 09-10-23   Ready to quit: Yes Counseling given: Yes Tobacco comments: 4-5 cigarettes/day Pt smokes 1/2 ppd. AB. CMA 09-10-23   Outpatient Encounter Medications as of 11/26/2023  Medication Sig   Accu-Chek Softclix Lancets lancets USE TO TEST AS DIRECTED UP TO FOUR TIMES DAILY.   B-D ULTRAFINE III SHORT PEN 31G X 8 MM MISC USE AS DIRECTED EVERY MORNING, NOON, EVENING, AND AT BEDTIME   Continuous Blood Gluc Sensor (FREESTYLE LIBRE 3 SENSOR) MISC Place 1 sensor on the skin every 14 days. Use to check glucose continuously   Dexlansoprazole  30 MG capsule DR TAKE 1 CAPSULE (30 MG TOTAL) BY MOUTH DAILY. NEEDS OFFICE VISIT FOR ADDITIONAL REFILLS (Patient taking differently: Take 30 mg by mouth daily as needed (for acid reflux). NEEDS OFFICE VISIT FOR ADDITIONAL REFILLS)   glucose blood (ACCU-CHEK GUIDE) test strip USE TO TEST BLOOD GLUCOSE AS DIRECTED UP TO 4 TIMES DAILY   metoCLOPramide  (REGLAN ) 10 MG tablet Take 1 tablet (10 mg total) by mouth every 8 (eight) hours as needed for nausea.   naloxone  (NARCAN ) nasal spray 4 mg/0.1 mL Insert 1 spray into one nostril if poorly responding / turning blue. CALL 911 ASAP   nicotine  (NICODERM CQ  - DOSED IN MG/24 HOURS) 21 mg/24hr patch Place 1 patch (21 mg total) onto the skin daily.   nitroGLYCERIN  (NITROSTAT ) 0.4 MG SL tablet Place 1 tablet (0.4 mg total)  under the tongue every 5 (five) minutes as needed for chest pain. Additional refills to be filled by PCP, patient aware   norethindrone  (AYGESTIN ) 5 MG tablet Take 1 tablet (5 mg total) by mouth 2 (two) times daily.   ondansetron  (ZOFRAN ) 4 MG tablet Take 1 tablet (4 mg total) by mouth every 8 (eight) hours as needed for nausea or vomiting.   Pimozide 1 MG TABS Take 1 mg by mouth daily as needed (feeling of facial itching).   potassium chloride  SA (KLOR-CON  M) 20 MEQ tablet Take 1 tablet (20 mEq total) by mouth daily.   pregabalin  (LYRICA ) 50 MG capsule Take 1 capsule (50 mg total) by mouth at bedtime.   psyllium (METAMUCIL SMOOTH TEXTURE) 58.6 % powder  Take 1 packet by mouth 3 (three) times daily.   sucralfate  (CARAFATE ) 1 g tablet Take 1 tablet (1 g total) by mouth 4 (four) times daily -  with meals and at bedtime.   tiZANidine  (ZANAFLEX ) 4 MG tablet Take 1 tablet (4 mg total) by mouth every 6 (six) hours as needed for muscle spasms.   TOUJEO  MAX SOLOSTAR 300 UNIT/ML Solostar Pen Inject 40 Units into the skin in the morning.   TOUJEO  MAX SOLOSTAR 300 UNIT/ML Solostar Pen Inject 60 Units into the skin in the morning.   [DISCONTINUED] albuterol  (VENTOLIN  HFA) 108 (90 Base) MCG/ACT inhaler Inhale 2 puffs into the lungs every 6 (six) hours as needed for wheezing or shortness of breath.   [DISCONTINUED] budesonide -formoterol  (SYMBICORT ) 160-4.5 MCG/ACT inhaler Inhale 2 puffs into the lungs 2 (two) times daily. INHALE 2 PUFFS INTO THE LUNGS TWICE DAILY   [DISCONTINUED] dicyclomine  (BENTYL ) 20 MG tablet Take 1 tablet (20 mg total) by mouth 2 (two) times daily.   [DISCONTINUED] empagliflozin  (JARDIANCE ) 10 MG TABS tablet Take 1 tablet (10 mg total) by mouth daily.   [DISCONTINUED] furosemide  (LASIX ) 40 MG tablet Take 1 tablet (40 mg total) by mouth daily.   [DISCONTINUED] HUMALOG  KWIKPEN 200 UNIT/ML KwikPen Inject 50 Units into the skin 3 (three) times daily before meals.   [DISCONTINUED] metoprolol   succinate (TOPROL -XL) 100 MG 24 hr tablet Take 1 tablet (100 mg total) by mouth daily. Take with or immediately following a meal.   [DISCONTINUED] Oxycodone  HCl 10 MG TABS Take 1 tablet (10 mg total) by mouth every 6 (six) hours as needed for severe pain (pain score 7-10) or moderate pain (pain score 4-6).   [DISCONTINUED] rosuvastatin  (CRESTOR ) 20 MG tablet TAKE 1 TABLET(20 MG) BY MOUTH DAILY   [DISCONTINUED] spironolactone  (ALDACTONE ) 25 MG tablet Take 1 tablet (25 mg total) by mouth daily.   albuterol  (VENTOLIN  HFA) 108 (90 Base) MCG/ACT inhaler Inhale 2 puffs into the lungs every 6 (six) hours as needed for wheezing or shortness of breath.   budesonide -formoterol  (SYMBICORT ) 160-4.5 MCG/ACT inhaler Inhale 2 puffs into the lungs 2 (two) times daily. INHALE 2 PUFFS INTO THE LUNGS TWICE DAILY   diclofenac  Sodium (VOLTAREN ) 1 % GEL Apply 2 to 4 gram onto painful sites up to 4 times daily if needed, max daily dose: 32 Gram (Patient not taking: Reported on 11/26/2023)   diclofenac  Sodium (VOLTAREN ) 1 % GEL Apply topically every 4 (four) hours as needed. (Patient not taking: Reported on 11/26/2023)   dicyclomine  (BENTYL ) 20 MG tablet Take 1 tablet (20 mg total) by mouth 2 (two) times daily.   empagliflozin  (JARDIANCE ) 10 MG TABS tablet Take 1 tablet (10 mg total) by mouth daily.   furosemide  (LASIX ) 40 MG tablet Take 1 tablet (40 mg total) by mouth daily.   HUMALOG  KWIKPEN 200 UNIT/ML KwikPen Inject 50 Units into the skin 3 (three) times daily before meals.   hydrALAZINE  (APRESOLINE ) 25 MG tablet Take 1 tablet (25 mg total) by mouth 3 (three) times daily. (Patient not taking: Reported on 11/26/2023)   melatonin 3 MG TABS tablet Take 1 tablet (3 mg total) by mouth at bedtime. (Patient not taking: Reported on 11/26/2023)   metoprolol  succinate (TOPROL -XL) 100 MG 24 hr tablet Take 1 tablet (100 mg total) by mouth daily. Take with or immediately following a meal.   nicotine  polacrilex (GOODSENSE NICOTINE ) 2 MG  gum Take 1 each (2 mg total) by mouth as needed for smoking cessation. (Patient not taking: Reported  on 11/26/2023)   polyethylene glycol (MIRALAX ) 17 g packet Take 17 g by mouth daily as needed. (Patient not taking: Reported on 11/26/2023)   rosuvastatin  (CRESTOR ) 20 MG tablet Take 1 tablet (20 mg total) by mouth daily.   senna-docusate (SENOKOT-S) 8.6-50 MG tablet Take 1 tablet by mouth daily. (Patient not taking: Reported on 11/26/2023)   spironolactone  (ALDACTONE ) 25 MG tablet Take 1 tablet (25 mg total) by mouth daily.   [DISCONTINUED] Blood Pressure Monitoring (BLOOD PRESSURE KIT) KIT 1 each by Does not apply route daily. (Patient not taking: Reported on 11/26/2023)   [DISCONTINUED] clindamycin  (CLEOCIN ) 300 MG capsule Take 1 capsule (300 mg total) by mouth 4 (four) times daily. X 7 days (Patient not taking: Reported on 11/26/2023)   [DISCONTINUED] Oxycodone  HCl 10 MG TABS Take 1 tablet (10 mg total) by mouth 4 (four) times daily as needed.   [DISCONTINUED] Oxycodone  HCl 10 MG TABS Take 1 tablet (10 mg total) by mouth 4 (four) times daily as needed for pain.   [DISCONTINUED] Oxycodone  HCl 10 MG TABS Take 1 tablet (10 mg total) by mouth 4 (four) times daily as needed for pain   [DISCONTINUED] Oxycodone  HCl 10 MG TABS Take 1 tablet (10 mg total) by mouth every 4 (four) hours as needed for pain.   No facility-administered encounter medications on file as of 11/26/2023.    Review of Systems  Review of Systems  Constitutional: Negative.   HENT: Negative.    Cardiovascular: Negative.   Gastrointestinal: Negative.   Allergic/Immunologic: Negative.   Neurological: Negative.   Psychiatric/Behavioral: Negative.       Objective:   BP 136/76   Pulse 90   Temp 97.9 F (36.6 C)   Wt 275 lb 12.8 oz (125.1 kg)   SpO2 100%   BMI 47.34 kg/m   Wt Readings from Last 5 Encounters:  11/26/23 275 lb 12.8 oz (125.1 kg)  09/10/23 268 lb (121.6 kg)  09/08/23 270 lb (122.5 kg)  07/31/23 272 lb 3.2 oz  (123.5 kg)  07/27/23 250 lb (113.4 kg)     Physical Exam Vitals and nursing note reviewed.  Constitutional:      General: She is not in acute distress.    Appearance: She is well-developed.  Cardiovascular:     Rate and Rhythm: Normal rate and regular rhythm.  Pulmonary:     Effort: Pulmonary effort is normal.     Breath sounds: Normal breath sounds.  Neurological:     Mental Status: She is alert and oriented to person, place, and time.       Assessment & Plan:   Urinary incontinence, unspecified type -     For home use only DME Other see comment  Controlled type 2 diabetes mellitus without complication, with long-term current use of insulin  (HCC) -     POCT glycosylated hemoglobin (Hb A1C)  Chronic bronchitis, unspecified chronic bronchitis type (HCC) -     Albuterol  Sulfate HFA; Inhale 2 puffs into the lungs every 6 (six) hours as needed for wheezing or shortness of breath.  Dispense: 8.5 g; Refill: 3  Chronic obstructive pulmonary disease, unspecified COPD type (HCC) -     Budesonide -Formoterol  Fumarate; Inhale 2 puffs into the lungs 2 (two) times daily. INHALE 2 PUFFS INTO THE LUNGS TWICE DAILY  Dispense: 10.2 g; Refill: 5  Essential hypertension -     Furosemide ; Take 1 tablet (40 mg total) by mouth daily.  Dispense: 30 tablet; Refill: 0  Type 2 diabetes mellitus  without complication, with long-term current use of insulin  (HCC) -     HumaLOG  KwikPen; Inject 50 Units into the skin 3 (three) times daily before meals.  Dispense: 60 mL; Refill: 0 -     Ambulatory referral to Endocrinology  Chronic heart failure with preserved ejection fraction (HCC) -     Spironolactone ; Take 1 tablet (25 mg total) by mouth daily.  Dispense: 30 tablet; Refill: 0  Mixed hyperlipidemia -     Rosuvastatin  Calcium ; Take 1 tablet (20 mg total) by mouth daily.  Dispense: 90 tablet; Refill: 3  Chronic pain syndrome -     Ambulatory referral to Pain Clinic  Other orders -     Dicyclomine   HCl; Take 1 tablet (20 mg total) by mouth 2 (two) times daily.  Dispense: 60 tablet; Refill: 3 -     Empagliflozin ; Take 1 tablet (10 mg total) by mouth daily.  Dispense: 30 tablet; Refill: 0 -     Metoprolol  Succinate ER; Take 1 tablet (100 mg total) by mouth daily. Take with or immediately following a meal.  Dispense: 30 tablet; Refill: 0 -     tiZANidine  HCl; Take 1 tablet (4 mg total) by mouth every 6 (six) hours as needed for muscle spasms.  Dispense: 30 tablet; Refill: 0     Return in about 3 months (around 02/23/2024).   [name], NP 11/26/2023

## 2023-11-27 ENCOUNTER — Telehealth: Payer: Self-pay

## 2023-11-27 NOTE — Telephone Encounter (Signed)
Copied from CRM [id number]. Topic: Clinical - Medication Question >> Nov 27, 2023 10:11 AM Shelah Lewandowsky wrote: Reason for CRM: Patient is asking for a stronger laxative and also asking for a scale, please call patient [phone]

## 2023-11-28 ENCOUNTER — Other Ambulatory Visit: Payer: Self-pay | Admitting: Nurse Practitioner

## 2023-11-28 ENCOUNTER — Other Ambulatory Visit: Payer: Self-pay

## 2023-11-28 MED ORDER — POLYETHYLENE GLYCOL 3350 17 G PO PACK
17.0000 g | PACK | Freq: Every day | ORAL | 2 refills | Status: DC | PRN
  Filled 2023-11-28: qty 14, 14d supply, fill #0

## 2023-11-30 ENCOUNTER — Other Ambulatory Visit: Payer: Self-pay | Admitting: Nurse Practitioner

## 2023-11-30 ENCOUNTER — Other Ambulatory Visit: Payer: Self-pay

## 2023-12-01 ENCOUNTER — Emergency Department (HOSPITAL_COMMUNITY)
Admission: EM | Admit: 2023-12-01 | Discharge: 2023-12-01 | Disposition: A | Discharge status: Home / Self Care | Payer: Medicare Other | Attending: Emergency Medicine | Admitting: Emergency Medicine

## 2023-12-01 ENCOUNTER — Other Ambulatory Visit: Payer: Self-pay

## 2023-12-01 ENCOUNTER — Encounter (HOSPITAL_COMMUNITY): Payer: Self-pay

## 2023-12-01 ENCOUNTER — Emergency Department (HOSPITAL_COMMUNITY): Payer: Medicare Other

## 2023-12-01 DIAGNOSIS — E876 Hypokalemia: Secondary | ICD-10-CM | POA: Diagnosis not present

## 2023-12-01 DIAGNOSIS — R531 Weakness: Secondary | ICD-10-CM | POA: Diagnosis not present

## 2023-12-01 DIAGNOSIS — E1122 Type 2 diabetes mellitus with diabetic chronic kidney disease: Secondary | ICD-10-CM | POA: Diagnosis not present

## 2023-12-01 DIAGNOSIS — J45909 Unspecified asthma, uncomplicated: Secondary | ICD-10-CM | POA: Diagnosis not present

## 2023-12-01 DIAGNOSIS — I509 Heart failure, unspecified: Secondary | ICD-10-CM | POA: Insufficient documentation

## 2023-12-01 DIAGNOSIS — M47814 Spondylosis without myelopathy or radiculopathy, thoracic region: Secondary | ICD-10-CM | POA: Insufficient documentation

## 2023-12-01 DIAGNOSIS — K2970 Gastritis, unspecified, without bleeding: Secondary | ICD-10-CM | POA: Diagnosis not present

## 2023-12-01 DIAGNOSIS — Z7984 Long term (current) use of oral hypoglycemic drugs: Secondary | ICD-10-CM | POA: Insufficient documentation

## 2023-12-01 DIAGNOSIS — Z794 Long term (current) use of insulin: Secondary | ICD-10-CM | POA: Insufficient documentation

## 2023-12-01 DIAGNOSIS — N189 Chronic kidney disease, unspecified: Secondary | ICD-10-CM | POA: Insufficient documentation

## 2023-12-01 DIAGNOSIS — K429 Umbilical hernia without obstruction or gangrene: Secondary | ICD-10-CM | POA: Insufficient documentation

## 2023-12-01 DIAGNOSIS — M47896 Other spondylosis, lumbar region: Secondary | ICD-10-CM | POA: Diagnosis not present

## 2023-12-01 DIAGNOSIS — I130 Hypertensive heart and chronic kidney disease with heart failure and stage 1 through stage 4 chronic kidney disease, or unspecified chronic kidney disease: Secondary | ICD-10-CM | POA: Insufficient documentation

## 2023-12-01 DIAGNOSIS — Z7951 Long term (current) use of inhaled steroids: Secondary | ICD-10-CM | POA: Insufficient documentation

## 2023-12-01 DIAGNOSIS — R1084 Generalized abdominal pain: Secondary | ICD-10-CM

## 2023-12-01 DIAGNOSIS — R109 Unspecified abdominal pain: Secondary | ICD-10-CM | POA: Diagnosis present

## 2023-12-01 DIAGNOSIS — Z79899 Other long term (current) drug therapy: Secondary | ICD-10-CM | POA: Insufficient documentation

## 2023-12-01 LAB — CBC
HCT: 43.7 % (ref 36.0–46.0)
Hemoglobin: 14.9 g/dL (ref 12.0–15.0)
MCH: 28 pg (ref 26.0–34.0)
MCHC: 34.1 g/dL (ref 30.0–36.0)
MCV: 82 fL (ref 80.0–100.0)
Platelets: 184 10*3/uL (ref 150–400)
RBC: 5.33 MIL/uL — ABNORMAL HIGH (ref 3.87–5.11)
RDW: 17.4 % — ABNORMAL HIGH (ref 11.5–15.5)
WBC: 9.4 10*3/uL (ref 4.0–10.5)
nRBC: 0 % (ref 0.0–0.2)

## 2023-12-01 LAB — URINALYSIS, ROUTINE W REFLEX MICROSCOPIC
Bilirubin Urine: NEGATIVE
Glucose, UA: NEGATIVE mg/dL
Hgb urine dipstick: NEGATIVE
Ketones, ur: NEGATIVE mg/dL
Leukocytes,Ua: NEGATIVE
Nitrite: NEGATIVE
Protein, ur: NEGATIVE mg/dL
Specific Gravity, Urine: >1.046 — ABNORMAL HIGH (ref 1.005–1.030)
pH: 5 (ref 5.0–8.0)

## 2023-12-01 LAB — RESP PANEL BY RT-PCR (RSV, FLU A&B, COVID)  RVPGX2
Influenza A by PCR: NEGATIVE
Influenza B by PCR: NEGATIVE
Resp Syncytial Virus by PCR: NEGATIVE
SARS Coronavirus 2 by RT PCR: NEGATIVE

## 2023-12-01 LAB — BASIC METABOLIC PANEL
Anion gap: 12 (ref 5–15)
BUN: 10 mg/dL (ref 6–20)
CO2: 21 mmol/L — ABNORMAL LOW (ref 22–32)
Calcium: 9.4 mg/dL (ref 8.9–10.3)
Chloride: 107 mmol/L (ref 98–111)
Creatinine, Ser: 1.26 mg/dL — ABNORMAL HIGH (ref 0.44–1.00)
GFR, Estimated: 53 mL/min — ABNORMAL LOW (ref >60)
Glucose, Bld: 183 mg/dL — ABNORMAL HIGH (ref 70–99)
Potassium: 3.1 mmol/L — ABNORMAL LOW (ref 3.5–5.1)
Sodium: 140 mmol/L (ref 135–145)

## 2023-12-01 LAB — MAGNESIUM: Magnesium: 1.9 mg/dL (ref 1.7–2.4)

## 2023-12-01 LAB — HCG, SERUM, QUALITATIVE: Preg, Serum: NEGATIVE

## 2023-12-01 LAB — BRAIN NATRIURETIC PEPTIDE: B Natriuretic Peptide: 61.4 pg/mL (ref 0.0–100.0)

## 2023-12-01 LAB — CBG MONITORING, ED
Glucose-Capillary: 216 mg/dL — ABNORMAL HIGH (ref 70–99)
Glucose-Capillary: 91 mg/dL (ref 70–99)

## 2023-12-01 LAB — TROPONIN I (HIGH SENSITIVITY): Troponin I (High Sensitivity): 4 ng/L (ref <18)

## 2023-12-01 MED ORDER — ONDANSETRON HCL 4 MG/2ML IJ SOLN
4.0000 mg | Freq: Once | INTRAMUSCULAR | Status: AC
  Administered 2023-12-01: 4 mg via INTRAVENOUS
  Filled 2023-12-01: qty 2

## 2023-12-01 MED ORDER — ONDANSETRON 4 MG PO TBDP: 4.0000 mg | ORAL_TABLET | Freq: Three times a day (TID) | ORAL | 0 refills | Status: DC | PRN

## 2023-12-01 MED ORDER — PANTOPRAZOLE SODIUM 20 MG PO TBEC: 20.0000 mg | DELAYED_RELEASE_TABLET | Freq: Every day | ORAL | 0 refills | Status: DC

## 2023-12-01 MED ORDER — POTASSIUM CHLORIDE CRYS ER 20 MEQ PO TBCR
40.0000 meq | EXTENDED_RELEASE_TABLET | Freq: Once | ORAL | Status: AC
  Administered 2023-12-01: 40 meq via ORAL
  Filled 2023-12-01: qty 2

## 2023-12-01 MED ORDER — IOHEXOL 350 MG/ML SOLN
75.0000 mL | Freq: Once | INTRAVENOUS | Status: AC | PRN
  Administered 2023-12-01: 75 mL via INTRAVENOUS

## 2023-12-01 MED ORDER — MORPHINE SULFATE (PF) 4 MG/ML IV SOLN
4.0000 mg | Freq: Once | INTRAVENOUS | Status: AC
  Administered 2023-12-01: 4 mg via INTRAVENOUS
  Filled 2023-12-01: qty 1

## 2023-12-01 NOTE — ED Triage Notes (Signed)
 Pt c.o gen weakness, body aches, chills, headache and nausea since Monday

## 2023-12-01 NOTE — Discharge Instructions (Addendum)
 You are seen in the emergency room today for nausea vomiting abdominal pain.  Your workup.  It is reassuring.  CT scan without any acute abnormality.  Please follow-up with your primary care doctor.  I have sent medications to your pharmacy.  Please try bland foods over the next 2 to 3 days.  I did recommend electrolyte drink Gatorade or Pedialyte.  Return to emergency room with new or worsening symptoms.  I have sent pantoprazole to your pharmacy.  I recommend Tylenol as needed for pain.  Stay away from anti-inflammatories, do not drink alcohol on empty stomach.

## 2023-12-01 NOTE — ED Provider Notes (Signed)
 Jill Shaw EMERGENCY DEPARTMENT AT [facility] Provider Note   CSN: [id number] Arrival date & time: 12/01/23  4098     History  Chief Complaint  Patient presents with   Weakness   Nausea   Generalized Body Aches    Jill Shaw is a 48 y.o. female with past medical history of hypertension, asthma, COPD, CHF, diabetes, chronic kidney disease, hyperlipidemia, GERD, anxiety presenting to emergency room with complaint of muscle aches, nausea, generalized weakness, headache, shortness of breath and abdominal pain.  Patient reports this has been ongoing for 4 days.  Patient reports that she has full body pain.  She describes this as feeling very sore.  Patient has no injury trauma or falls.  She has been able to eat and drink at home.  Denies any fevers at home, chest pain.  Patient has no known sick contacts.  Reports she has been taking her medicines as prescribed.   Weakness      Home Medications Prior to Admission medications   Medication Sig Start Date End Date Taking? Authorizing Provider  Accu-Chek Softclix Lancets lancets USE TO TEST AS DIRECTED UP TO FOUR TIMES DAILY. 03/06/22   Shamleffer, Konrad Dolores, MD  albuterol (VENTOLIN HFA) 108 (90 Base) MCG/ACT inhaler Inhale 2 puffs into the lungs every 6 (six) hours as needed for wheezing or shortness of breath. 11/26/23   Ivonne Andrew, NP  B-D ULTRAFINE III SHORT PEN 31G X 8 MM MISC USE AS DIRECTED EVERY MORNING, NOON, EVENING, AND AT BEDTIME 07/10/22   Shamleffer, Konrad Dolores, MD  budesonide-formoterol (SYMBICORT) 160-4.5 MCG/ACT inhaler Inhale 2 puffs into the lungs 2 (two) times daily. 11/26/23   Ivonne Andrew, NP  Continuous Blood Gluc Sensor (FREESTYLE LIBRE 3 SENSOR) MISC Place 1 sensor on the skin every 14 days. Use to check glucose continuously 07/06/22   Ivonne Andrew, NP  Dexlansoprazole 30 MG capsule DR TAKE 1 CAPSULE (30 MG TOTAL) BY MOUTH DAILY. NEEDS OFFICE VISIT FOR ADDITIONAL REFILLS Patient  taking differently: Take 30 mg by mouth daily as needed (for acid reflux). NEEDS OFFICE VISIT FOR ADDITIONAL REFILLS 03/26/23   Ivonne Andrew, NP  diclofenac Sodium (VOLTAREN) 1 % GEL Apply 2 to 4 gram onto painful sites up to 4 times daily if needed, max daily dose: 32 Gram Patient not taking: Reported on 11/26/2023 10/13/23     diclofenac Sodium (VOLTAREN) 1 % GEL Apply topically every 4 (four) hours as needed. Patient not taking: Reported on 11/26/2023 11/14/23     dicyclomine (BENTYL) 20 MG tablet Take 1 tablet (20 mg total) by mouth 2 (two) times daily. 11/26/23   Ivonne Andrew, NP  empagliflozin (JARDIANCE) 10 MG TABS tablet Take 1 tablet (10 mg total) by mouth daily. 11/26/23   Ivonne Andrew, NP  furosemide (LASIX) 40 MG tablet Take 1 tablet (40 mg total) by mouth daily. 11/26/23   Ivonne Andrew, NP  glucose blood (ACCU-CHEK GUIDE) test strip USE TO TEST BLOOD GLUCOSE AS DIRECTED UP TO 4 TIMES DAILY 11/14/22   Ivonne Andrew, NP  HUMALOG KWIKPEN 200 UNIT/ML KwikPen Inject 50 Units into the skin 3 (three) times daily before meals. 11/26/23   Ivonne Andrew, NP  hydrALAZINE (APRESOLINE) 25 MG tablet Take 1 tablet (25 mg total) by mouth 3 (three) times daily. Patient not taking: Reported on 11/26/2023 11/13/23   Ivonne Andrew, NP  melatonin 3 MG TABS tablet Take 1 tablet (3 mg total) by  mouth at bedtime. Patient not taking: Reported on 11/26/2023 08/13/23   Ivonne Andrew, NP  metoCLOPramide (REGLAN) 10 MG tablet Take 1 tablet (10 mg total) by mouth every 8 (eight) hours as needed for nausea. 10/07/23   Rancour, Jeannett Senior, MD  metoprolol succinate (TOPROL-XL) 100 MG 24 hr tablet Take 1 tablet (100 mg total) by mouth daily. Take with or immediately following a meal. 11/26/23   Ivonne Andrew, NP  naloxone [facility]) nasal spray 4 mg/0.1 mL Insert 1 spray into one nostril if poorly responding / turning blue. CALL 911 ASAP 10/13/23     nicotine (NICODERM CQ - DOSED IN MG/24 HOURS) 21 mg/24hr  patch Place 1 patch (21 mg total) onto the skin daily. 10/11/23 10/10/24  Ivonne Andrew, NP  nicotine polacrilex (GOODSENSE NICOTINE) 2 MG gum Take 1 each (2 mg total) by mouth as needed for smoking cessation. Patient not taking: Reported on 11/26/2023 09/26/23   Ivonne Andrew, NP  nitroGLYCERIN (NITROSTAT) 0.4 MG SL tablet Place 1 tablet (0.4 mg total) under the tongue every 5 (five) minutes as needed for chest pain. Additional refills to be filled by PCP, patient aware 09/06/23   Ivonne Andrew, NP  norethindrone (AYGESTIN) 5 MG tablet Take 1 tablet (5 mg total) by mouth 2 (two) times daily. 11/19/23   Lorriane Shire, MD  ondansetron (ZOFRAN) 4 MG tablet Take 1 tablet (4 mg total) by mouth every 8 (eight) hours as needed for nausea or vomiting. 06/06/23   Ivonne Andrew, NP  Pimozide 1 MG TABS Take 1 mg by mouth daily as needed (feeling of facial itching). 02/10/23   [provider]  polyethylene glycol (MIRALAX) 17 g packet Take 17 g by mouth daily as needed. 11/28/23   Ivonne Andrew, NP  potassium chloride SA (KLOR-CON M) 20 MEQ tablet Take 1 tablet (20 mEq total) by mouth daily. 10/29/23   Ivonne Andrew, NP  pregabalin (LYRICA) 50 MG capsule Take 1 capsule (50 mg total) by mouth at bedtime. 11/14/23     psyllium (METAMUCIL SMOOTH TEXTURE) 58.6 % powder Take 1 packet by mouth 3 (three) times daily. 11/13/23   Ivonne Andrew, NP  rosuvastatin (CRESTOR) 20 MG tablet Take 1 tablet (20 mg total) by mouth daily. 11/26/23   Ivonne Andrew, NP  senna-docusate (SENOKOT-S) 8.6-50 MG tablet Take 1 tablet by mouth daily. Patient not taking: Reported on 11/26/2023 08/06/23   Ivonne Andrew, NP  spironolactone (ALDACTONE) 25 MG tablet Take 1 tablet (25 mg total) by mouth daily. 11/26/23   Ivonne Andrew, NP  sucralfate (CARAFATE) 1 g tablet Take 1 tablet (1 g total) by mouth 4 (four) times daily -  with meals and at bedtime. 08/13/23   Zehr, Princella Pellegrini, PA-C  tiZANidine (ZANAFLEX) 4 MG  tablet Take 1 tablet (4 mg total) by mouth every 6 (six) hours as needed for muscle spasms. 11/26/23   Ivonne Andrew, NP  TOUJEO MAX SOLOSTAR 300 UNIT/ML Solostar Pen Inject 40 Units into the skin in the morning. 07/31/23   Zannie Cove, MD  TOUJEO MAX SOLOSTAR 300 UNIT/ML Solostar Pen Inject 60 Units into the skin in the morning. 10/24/23   Ivonne Andrew, NP      Allergies    Elavil [amitriptyline], Orudis [ketoprofen], Desyrel [trazodone], Tylenol [acetaminophen], Aspirin, Motrin [ibuprofen], Naprosyn [naproxen], Neurontin [gabapentin], Sulfa antibiotics, Ultram [tramadol], Victoza [liraglutide], and Zegerid [omeprazole-sodium bicarbonate]    Review of Systems   Review of  Systems  Neurological:  Positive for weakness.    Physical Exam Updated Vital Signs BP (!) 118/95   Pulse 68   Temp 98.8 F (37.1 C)   Resp 14   Ht 5\' 4"  (1.626 m)   Wt 122.5 kg   SpO2 100%   BMI 46.35 kg/m  Physical Exam Vitals and nursing note reviewed.  Constitutional:      General: She is not in acute distress.    Appearance: She is not toxic-appearing.  HENT:     Head: Normocephalic and atraumatic.  Eyes:     General: No scleral icterus.    Conjunctiva/sclera: Conjunctivae normal.  Cardiovascular:     Rate and Rhythm: Normal rate and regular rhythm.     Pulses: Normal pulses.     Heart sounds: Normal heart sounds.  Pulmonary:     Effort: Pulmonary effort is normal. No respiratory distress.     Breath sounds: Normal breath sounds.  Abdominal:     General: Abdomen is flat. Bowel sounds are normal. There is no distension.     Palpations: Abdomen is soft. There is no mass.     Tenderness: There is abdominal tenderness. There is no right CVA tenderness or left CVA tenderness.  Musculoskeletal:     Right lower leg: No edema.     Left lower leg: No edema.  Skin:    General: Skin is warm and dry.     Findings: No lesion.  Neurological:     General: No focal deficit present.     Mental  Status: She is alert and oriented to person, place, and time. Mental status is at baseline.     ED Results / Procedures / Treatments   Labs (all labs ordered are listed, but only abnormal results are displayed) Labs Reviewed  BASIC METABOLIC PANEL - Abnormal; Notable for the following components:      Result Value   Potassium 3.1 (*)    CO2 21 (*)    Glucose, Bld 183 (*)    Creatinine, Ser 1.26 (*)    GFR, Estimated 53 (*)    All other components within normal limits  CBC - Abnormal; Notable for the following components:   RBC 5.33 (*)    RDW 17.4 (*)    All other components within normal limits  CBG MONITORING, ED - Abnormal; Notable for the following components:   Glucose-Capillary 216 (*)    All other components within normal limits  RESP PANEL BY RT-PCR (RSV, FLU A&B, COVID)  RVPGX2  HCG, SERUM, QUALITATIVE  MAGNESIUM  URINALYSIS, ROUTINE W REFLEX MICROSCOPIC  BRAIN NATRIURETIC PEPTIDE  CBG MONITORING, ED  TROPONIN I (HIGH SENSITIVITY)    EKG EKG Interpretation Date/Time:  Saturday December 01 2023 09:58:59 EST Ventricular Rate:  71 PR Interval:  144 QRS Duration:  91 QT Interval:  391 QTC Calculation: 425 R Axis:   -12  Text Interpretation: Sinus rhythm Low voltage, precordial leads Anteroseptal infarct, old Borderline T abnormalities, inferior leads Confirmed by Kristine Royal [phone]) on 12/01/2023 10:11:54 AM  Radiology DG Chest 2 View Result Date: 12/01/2023 CLINICAL DATA:  CP, generalized weakness, history of CHF EXAM: CHEST - 2 VIEW COMPARISON:  July 28, 2023 FINDINGS: Evaluation is limited by underpenetration. The cardiomediastinal silhouette is unchanged in contour. No pleural effusion. No pneumothorax. No acute pleuroparenchymal abnormality. Visualized abdomen is unremarkable. Mild degenerative changes of the thoracic spine. IMPRESSION: No acute cardiopulmonary abnormality. Electronically Signed   By: Meda Klinefelter M.D.   On:  12/01/2023 11:22     Procedures Procedures    Medications Ordered in ED Medications - No data to display  ED Course/ Medical Decision Making/ A&P                                 Medical Decision Making Amount and/or Complexity of Data Reviewed Labs: ordered. Radiology: ordered.  Risk Prescription drug management.   Jill Shaw 48 y.o. presented today for generalized weakness and abdominal pain. Working DDx that I considered at this time includes, but not limited to, CVA/TIA, arrhythmia, vertigo, medication s/e, orthostatic hypotension, electrolyte abnormalities, dehydration, URI, ACS, UTI, anemia   R/o DDx: These are considered less likely than current impression due to history of present illness, physical exam, lab/imaging findings   Review of prior external notes: 11/07/23 ED visit in which patient complained of similar pain  Pmhx: COPD, asthma, CHF  Unique Tests and My Interpretation:  CBC without any leukocytosis and no anemia BMP with mild hypokalemia of 3.1 will supplement orally, check magnesium which is 1.9 CBG within normal limits 1 troponin due to durations of symptoms, troponin 4 Respiratory panel is negative UA is pending  EKG: Rate, rhythm, axis, intervals all examined: Sinus without any significant changes since prior   Imaging:  CXR: Normal-appearing chest x-ray CT abdomen and pelvis:   Problem List / ED Course / Critical interventions / Medication management  Presenting to emergency room with multiple complaints.  Patient denies any chest pain thus doubt ACS however did obtain EKG and troponin since she is generalized weak and has some abdominal pain which more at baseline.  Did not repeat troponin due to duration of symptoms.  Doubt PE as cause that she does not have any sign of DVT on exam.  Patient blood pressure equal in both arms and strong radial and dorsal pedal pulse compared bilaterally.  Given history of present illness doubt dissection as cause of pain.   On initial evaluation patient hemodynamically stable well-appearing with no hypoxia.  Will obtain chest x-ray to rule out pneumonia or fluid overload.  Will obtain CT abdomen pelvis secondary to patient's significant abdominal pain.  Patient does have some hypokalemia but no hypomagnesemia.  Potassium is supplemented.  Patient passed p.o. challenge and is stable for discharge.  CT with no acute findings.  Patient does not have urinary tract infection nor symptoms of urinary tract infection.  Chest x-ray without any acute abnormality.  Upon reassessment patient is eating a sandwich in room reports her symptoms are better and she feels comfortable going home. I ordered medication including morphine, Zofran, potassium Reevaluation of the patient after these medicines showed that the patient improved Patients vitals assessed. Upon arrival patient is hemodynamically stable.  I have reviewed the patients home medicines and have made adjustments as needed  Consult: None  Plan:  F/u w/ PCP in 2-3d to ensure resolution of sx.  Patient was given return precautions. Patient stable for discharge at this time.  Patient educated on current sx/dx and verbalized understanding of plan. Return to ER w/ new or worsening sx.          Final Clinical Impression(s) / ED Diagnoses Final diagnoses:  Generalized abdominal pain  Gastritis without bleeding, unspecified chronicity, unspecified gastritis type    Rx / DC Orders ED Discharge Orders     None         Smitty Knudsen, PA-C 12/01/23 1434  Wynetta Fines, MD 12/01/23 [phone]

## 2023-12-03 ENCOUNTER — Other Ambulatory Visit: Payer: Self-pay

## 2023-12-03 ENCOUNTER — Telehealth: Payer: Self-pay

## 2023-12-03 ENCOUNTER — Telehealth: Payer: Self-pay | Admitting: Nurse Practitioner

## 2023-12-03 NOTE — Transitions of Care (Post Inpatient/ED Visit) (Signed)
   12/03/2023  Name: Jill Shaw MRN: [id number] DOB: [birth year]  Today's TOC FU Call Status:    Attempted to reach the patient regarding the most recent Inpatient/ED visit.  Follow Up Plan: Additional outreach attempts will be made to reach the patient to complete the Transitions of Care (Post Inpatient/ED visit) call.   Signature  [employer], [place]

## 2023-12-03 NOTE — Progress Notes (Unsigned)
  History:  [name] is a 48 y.o. G3P0 who presents to clinic today for ***   The following portions of the patient's history were reviewed and updated as appropriate: allergies, current medications, family history, past medical history, social history, past surgical history and problem list.  Review of Systems:  ROS    Objective:  Physical Exam There were no vitals taken for this visit. Physical Exam    Labs and Imaging No results found. However, due to the size of the patient record, not all encounters were searched. Please check Results Review for a complete set of results.  No results found.   Assessment & Plan:  1. Cervical cancer screening (Primary) ***  2. Encounter for annual routine gynecological examination ***    Approximately *** minutes of face-to-face time was spent with this patient   Richardson Landry, CNM 12/03/2023 11:43 AM

## 2023-12-03 NOTE — Telephone Encounter (Signed)
 Copied from CRM [id number]. Topic: Clinical - Medication Question >> Dec 03, 2023  3:30 PM Clayton Bibles wrote: Reason for CRM: Naz is calling back about refill of tiZANidine (ZANAFLEX) 4 MG tablet. Her pharmacy closes at 5 PM and she need to know when medication is sent in.  Please call her at [phone]

## 2023-12-03 NOTE — Telephone Encounter (Signed)
 Copied from CRM [id number]. Topic: Clinical - Prescription Issue >> Dec 03, 2023 10:03 AM Hector Shade B wrote: Reason for CRM: tiZANidine (ZANAFLEX) 4 MG tablet patient stated the medication was called in for a week she is going out of town and needed at least for 3 weeks.  [phone] Raynelle Fanning)

## 2023-12-04 ENCOUNTER — Other Ambulatory Visit: Payer: Self-pay

## 2023-12-04 ENCOUNTER — Telehealth: Payer: Self-pay

## 2023-12-04 ENCOUNTER — Ambulatory Visit (INDEPENDENT_AMBULATORY_CARE_PROVIDER_SITE_OTHER): Payer: Self-pay | Admitting: Certified Nurse Midwife

## 2023-12-04 DIAGNOSIS — Z01419 Encounter for gynecological examination (general) (routine) without abnormal findings: Secondary | ICD-10-CM

## 2023-12-04 DIAGNOSIS — Z91199 Patient's noncompliance with other medical treatment and regimen due to unspecified reason: Secondary | ICD-10-CM

## 2023-12-04 DIAGNOSIS — Z124 Encounter for screening for malignant neoplasm of cervix: Secondary | ICD-10-CM

## 2023-12-04 MED ORDER — TIZANIDINE HCL 4 MG PO TABS
4.0000 mg | ORAL_TABLET | Freq: Four times a day (QID) | ORAL | 0 refills | Status: DC | PRN
  Filled 2023-12-04: qty 60, 15d supply, fill #0

## 2023-12-04 NOTE — Transitions of Care (Post Inpatient/ED Visit) (Signed)
 12/04/2023  Name: Jill Shaw MRN: [id number] DOB: 1976/06/22  Today's TOC FU Call Status:   Patient's Name and Date of Birth confirmed.  Transition Care Management Follow-up Telephone Call Date of Discharge: 12/01/23 Discharge Facility: [facility]) Type of Discharge: Emergency Department Reason for ED Visit: Respiratory How have you been since you were released from the hospital?: Same Any questions or concerns?: No  Items Reviewed: Did you receive and understand the discharge instructions provided?: Yes Medications obtained,verified, and reconciled?: Yes (Medications Reviewed) Any new allergies since your discharge?: No Dietary orders reviewed?: No Do you have support at home?: Yes People in Home: parent(s) Name of Support/Comfort Primary Source: Mother  Medications Reviewed Today: Medications Reviewed Today     Reviewed by Veneta Penton, CMA (Certified Medical Assistant) on 12/04/23 at 1033  Med List Status: <None>   Medication Order Taking? Sig Documenting Provider Last Dose Status Informant  Accu-Chek Softclix Lancets lancets [id number] Yes USE TO TEST AS DIRECTED UP TO FOUR TIMES DAILY. Shamleffer, Konrad Dolores, MD Taking Active Self, Pharmacy Records  albuterol (VENTOLIN HFA) 108 (90 Base) MCG/ACT inhaler [id number] Yes Inhale 2 puffs into the lungs every 6 (six) hours as needed for wheezing or shortness of breath. Ivonne Andrew, NP Taking Active   B-D ULTRAFINE III SHORT PEN 31G X 8 MM MISC [id number] Yes USE AS DIRECTED EVERY MORNING, NOON, EVENING, AND AT BEDTIME Shamleffer, Konrad Dolores, MD Taking Active Self, Pharmacy Records  budesonide-formoterol [facility]) 160-4.5 MCG/ACT inhaler [id number] Yes Inhale 2 puffs into the lungs 2 (two) times daily. Ivonne Andrew, NP Taking Active   Continuous Blood Gluc Sensor (FREESTYLE LIBRE 3 SENSOR) [place] [id number] Yes Place 1 sensor on the skin every 14 days. Use to check glucose continuously Ivonne Andrew,  NP Taking Active Self, Pharmacy Records  Dexlansoprazole 30 MG capsule DR [id number] Yes TAKE 1 CAPSULE (30 MG TOTAL) BY MOUTH DAILY. NEEDS OFFICE VISIT FOR ADDITIONAL REFILLS  Patient taking differently: Take 30 mg by mouth daily as needed (for acid reflux). NEEDS OFFICE VISIT FOR ADDITIONAL REFILLS   Ivonne Andrew, NP Taking Active Self, Pharmacy Records  diclofenac Sodium (VOLTAREN) 1 % GEL [id number] No Apply 2 to 4 gram onto painful sites up to 4 times daily if needed, max daily dose: 32 Gram  Patient not taking: Reported on 12/04/2023    Not Taking Active   diclofenac Sodium (VOLTAREN) 1 % GEL [id number] No Apply topically every 4 (four) hours as needed.  Patient not taking: Reported on 12/04/2023    Not Taking Active   dicyclomine (BENTYL) 20 MG tablet [id number] Yes Take 1 tablet (20 mg total) by mouth 2 (two) times daily. Ivonne Andrew, NP Taking Active   empagliflozin (JARDIANCE) 10 MG TABS tablet [id number] Yes Take 1 tablet (10 mg total) by mouth daily. Ivonne Andrew, NP Taking Active   furosemide (LASIX) 40 MG tablet [id number] Yes Take 1 tablet (40 mg total) by mouth daily. Ivonne Andrew, NP Taking Active   glucose blood (ACCU-CHEK GUIDE) test strip [id number] Yes USE TO TEST BLOOD GLUCOSE AS DIRECTED UP TO 4 TIMES DAILY Ivonne Andrew, NP Taking Active Self, Pharmacy Records  HUMALOG [facility] 200 UNIT/ML KwikPen [id number] Yes Inject 50 Units into the skin 3 (three) times daily before meals. Ivonne Andrew, NP Taking Active   hydrALAZINE (APRESOLINE) 25 MG tablet [id number] Yes Take 1 tablet (25 mg total) by mouth 3 (three) times daily. Ivonne Andrew, NP Taking  Active   melatonin 3 MG TABS tablet [id number] No Take 1 tablet (3 mg total) by mouth at bedtime.  Patient not taking: Reported on 12/04/2023   Ivonne Andrew, NP Not Taking Active   metoCLOPramide (REGLAN) 10 MG tablet [id number] Yes Take 1 tablet (10 mg total) by mouth every 8 (eight) hours as needed for nausea.  Rancour, Jeannett Senior, MD Taking Active   metoprolol succinate (TOPROL-XL) 100 MG 24 hr tablet [id number] Yes Take 1 tablet (100 mg total) by mouth daily. Take with or immediately following a meal. Ivonne Andrew, NP Taking Active   naloxone [facility]) nasal spray 4 mg/0.1 mL [id number] No Insert 1 spray into one nostril if poorly responding / turning blue. CALL 911 ASAP  Patient not taking: Reported on 12/04/2023    Not Taking Active   nicotine (NICODERM CQ - DOSED IN MG/24 HOURS) 21 mg/24hr patch [id number] Yes Place 1 patch (21 mg total) onto the skin daily. Ivonne Andrew, NP Taking Active   nicotine polacrilex (GOODSENSE NICOTINE) 2 MG gum [id number] No Take 1 each (2 mg total) by mouth as needed for smoking cessation.  Patient not taking: Reported on 12/04/2023   Ivonne Andrew, NP Not Taking Active   nitroGLYCERIN (NITROSTAT) 0.4 MG SL tablet [id number] Yes Place 1 tablet (0.4 mg total) under the tongue every 5 (five) minutes as needed for chest pain. Additional refills to be filled by PCP, patient aware Ivonne Andrew, NP Taking Active   norethindrone (AYGESTIN) 5 MG tablet [id number] Yes Take 1 tablet (5 mg total) by mouth 2 (two) times daily. Lorriane Shire, MD Taking Active   ondansetron (ZOFRAN-ODT) 4 MG disintegrating tablet [id number] Yes Take 1 tablet (4 mg total) by mouth every 8 (eight) hours as needed for nausea or vomiting. Barrett, Horald Chestnut, PA-C Taking Active   pantoprazole (PROTONIX) 20 MG tablet [id number] Yes Take 1 tablet (20 mg total) by mouth daily. Barrett, Horald Chestnut, PA-C Taking Active   Pimozide 1 MG TABS [id number] Yes Take 1 mg by mouth daily as needed (feeling of facial itching). [provider] Taking Active Self, Pharmacy Records  polyethylene glycol (MIRALAX) 17 g packet [id number] Yes Take 17 g by mouth daily as needed. Ivonne Andrew, NP Taking Active   potassium chloride SA (KLOR-CON M) 20 MEQ tablet [id number] Yes Take 1 tablet (20 mEq total) by mouth daily.  Ivonne Andrew, NP Taking Active   pregabalin (LYRICA) 50 MG capsule [id number] Yes Take 1 capsule (50 mg total) by mouth at bedtime.  Taking Active   psyllium (METAMUCIL SMOOTH TEXTURE) 58.6 % powder [id number] Yes Take 1 packet by mouth 3 (three) times daily. Ivonne Andrew, NP Taking Active   rosuvastatin (CRESTOR) 20 MG tablet [id number] Yes Take 1 tablet (20 mg total) by mouth daily. Ivonne Andrew, NP Taking Active   senna-docusate (SENOKOT-S) 8.6-50 MG tablet [id number] Yes Take 1 tablet by mouth daily. Ivonne Andrew, NP Taking Active   spironolactone (ALDACTONE) 25 MG tablet [id number] Yes Take 1 tablet (25 mg total) by mouth daily. Ivonne Andrew, NP Taking Active   sucralfate (CARAFATE) 1 g tablet [id number] Yes Take 1 tablet (1 g total) by mouth 4 (four) times daily -  with meals and at bedtime. Zehr, Princella Pellegrini, PA-C Taking Active   tiZANidine (ZANAFLEX) 4 MG tablet [id number] Yes Take 1 tablet (4 mg total) by mouth every 6 (six) hours as needed for muscle spasms. Ivonne Andrew, NP Taking Active  TOUJEO MAX SOLOSTAR 300 UNIT/ML Solostar Pen [id number] Yes Inject 40 Units into the skin in the morning. Zannie Cove, MD Taking Active   TOUJEO MAX SOLOSTAR 300 UNIT/ML Solostar Pen [id number] Yes Inject 60 Units into the skin in the morning. Ivonne Andrew, NP Taking Active             Home Care and Equipment/Supplies: Were Home Health Services Ordered?: No Any new equipment or medical supplies ordered?: No  Functional Questionnaire: Do you need assistance with bathing/showering or dressing?: No Do you need assistance with meal preparation?: No Do you need assistance with eating?: No Do you have difficulty maintaining continence: No Do you need assistance with getting out of bed/getting out of a chair/moving?: No Do you have difficulty managing or taking your medications?: No  Follow up appointments reviewed: PCP Follow-up appointment confirmed?: Yes Date of PCP  follow-up appointment?: 12/14/23 Follow-up Provider: Angus Seller Specialist [facility] Follow-up appointment confirmed?: No Do you need transportation to your follow-up appointment?: No Do you understand care options if your condition(s) worsen?: Yes-patient verbalized understanding    SIGNATURE Josslynn Mentzer, CMA

## 2023-12-04 NOTE — Telephone Encounter (Signed)
 Copied from CRM [id number]. Topic: Clinical - Prescription Issue >> Dec 04, 2023  9:47 AM Hector Shade B wrote: Reason for CRM: Patient stated she was calling in about her refill for Tizanidine 4 mg she stated that no one had called her to pick up her medications. Advised patient to call pharmacy to check on update for prescription  Please advise [facility]

## 2023-12-05 ENCOUNTER — Other Ambulatory Visit: Payer: Self-pay

## 2023-12-06 ENCOUNTER — Other Ambulatory Visit: Payer: Self-pay

## 2023-12-06 ENCOUNTER — Ambulatory Visit: Payer: Medicare Other | Attending: Cardiology | Admitting: Cardiology

## 2023-12-06 NOTE — Progress Notes (Deleted)
  Cardiology Office Note:   Date:  12/06/2023  ID:  Jill, Shaw 30-Aug-1976, MRN [id number] PCP: Ivonne Andrew, NP  [facility] Providers Cardiologist:  Nanetta Batty, MD { Click to update primary MD,subspecialty MD or APP then REFRESH:1}   History of Present Illness:   Jill Shaw is a 48 y.o. female ***  Discussed the use of AI scribe software for clinical note transcription with the patient, who gave verbal consent to proceed.  History of Present Illness             Today patient denies chest pain, shortness of breath, lower extremity edema, fatigue, palpitations, melena, hematuria, hemoptysis, diaphoresis, weakness, presyncope, syncope, orthopnea, and PND.   Studies Reviewed:    EKG:        ***  Risk Assessment/Calculations:   {Does this patient have ATRIAL FIBRILLATION?:[phone]} No BP recorded.  {Refresh Note OR Click here to enter BP  :1}***        Physical Exam:   VS:  There were no vitals taken for this visit.   Wt Readings from Last 3 Encounters:  12/01/23 270 lb (122.5 kg)  11/26/23 275 lb 12.8 oz (125.1 kg)  09/10/23 268 lb (121.6 kg)     Physical Exam  Physical Exam           ASSESSMENT AND PLAN:     Assessment and Plan                  {Are you ordering a CV Procedure (e.g. stress test, cath, DCCV, TEE, etc)?   Press F2        :[id number]}   Signed, Perlie Gold, PA-C

## 2023-12-07 ENCOUNTER — Encounter: Payer: Self-pay | Admitting: Cardiology

## 2023-12-07 ENCOUNTER — Telehealth: Payer: Self-pay

## 2023-12-07 NOTE — Telephone Encounter (Signed)
 Copied from CRM [id number]. Topic: Clinical - Medication Question >> Dec 07, 2023  1:42 PM Carlatta H wrote: Reason for CRM: Patient was in the hospital on 2/15 but was not prescribed any medication//She would like a anabiotic prescribed//  Please advise in tonya absence thanks [facility]

## 2023-12-08 ENCOUNTER — Emergency Department (HOSPITAL_BASED_OUTPATIENT_CLINIC_OR_DEPARTMENT_OTHER)
Admission: EM | Admit: 2023-12-08 | Discharge: 2023-12-08 | Disposition: A | Discharge status: Home / Self Care | Payer: Medicare Other | Attending: Emergency Medicine | Admitting: Emergency Medicine

## 2023-12-08 ENCOUNTER — Emergency Department (HOSPITAL_BASED_OUTPATIENT_CLINIC_OR_DEPARTMENT_OTHER): Payer: Medicare Other

## 2023-12-08 ENCOUNTER — Other Ambulatory Visit: Payer: Self-pay

## 2023-12-08 DIAGNOSIS — M25531 Pain in right wrist: Secondary | ICD-10-CM | POA: Insufficient documentation

## 2023-12-08 DIAGNOSIS — J45909 Unspecified asthma, uncomplicated: Secondary | ICD-10-CM | POA: Insufficient documentation

## 2023-12-08 DIAGNOSIS — R197 Diarrhea, unspecified: Secondary | ICD-10-CM | POA: Diagnosis not present

## 2023-12-08 DIAGNOSIS — M791 Myalgia, unspecified site: Secondary | ICD-10-CM | POA: Diagnosis present

## 2023-12-08 DIAGNOSIS — R531 Weakness: Secondary | ICD-10-CM | POA: Diagnosis not present

## 2023-12-08 DIAGNOSIS — R109 Unspecified abdominal pain: Secondary | ICD-10-CM | POA: Diagnosis not present

## 2023-12-08 DIAGNOSIS — W19XXXA Unspecified fall, initial encounter: Secondary | ICD-10-CM | POA: Insufficient documentation

## 2023-12-08 DIAGNOSIS — Z79899 Other long term (current) drug therapy: Secondary | ICD-10-CM | POA: Insufficient documentation

## 2023-12-08 DIAGNOSIS — E1140 Type 2 diabetes mellitus with diabetic neuropathy, unspecified: Secondary | ICD-10-CM | POA: Diagnosis not present

## 2023-12-08 DIAGNOSIS — Z7951 Long term (current) use of inhaled steroids: Secondary | ICD-10-CM | POA: Diagnosis not present

## 2023-12-08 DIAGNOSIS — I130 Hypertensive heart and chronic kidney disease with heart failure and stage 1 through stage 4 chronic kidney disease, or unspecified chronic kidney disease: Secondary | ICD-10-CM | POA: Insufficient documentation

## 2023-12-08 DIAGNOSIS — B349 Viral infection, unspecified: Secondary | ICD-10-CM | POA: Diagnosis not present

## 2023-12-08 DIAGNOSIS — E1110 Type 2 diabetes mellitus with ketoacidosis without coma: Secondary | ICD-10-CM | POA: Insufficient documentation

## 2023-12-08 DIAGNOSIS — Z20822 Contact with and (suspected) exposure to covid-19: Secondary | ICD-10-CM | POA: Insufficient documentation

## 2023-12-08 DIAGNOSIS — N183 Chronic kidney disease, stage 3 unspecified: Secondary | ICD-10-CM | POA: Diagnosis not present

## 2023-12-08 DIAGNOSIS — Z794 Long term (current) use of insulin: Secondary | ICD-10-CM | POA: Insufficient documentation

## 2023-12-08 DIAGNOSIS — K219 Gastro-esophageal reflux disease without esophagitis: Secondary | ICD-10-CM | POA: Diagnosis not present

## 2023-12-08 DIAGNOSIS — I5032 Chronic diastolic (congestive) heart failure: Secondary | ICD-10-CM | POA: Insufficient documentation

## 2023-12-08 DIAGNOSIS — R110 Nausea: Secondary | ICD-10-CM | POA: Insufficient documentation

## 2023-12-08 DIAGNOSIS — Z7984 Long term (current) use of oral hypoglycemic drugs: Secondary | ICD-10-CM | POA: Diagnosis not present

## 2023-12-08 LAB — RESP PANEL BY RT-PCR (RSV, FLU A&B, COVID)  RVPGX2
Influenza A by PCR: NEGATIVE
Influenza B by PCR: NEGATIVE
Resp Syncytial Virus by PCR: NEGATIVE
SARS Coronavirus 2 by RT PCR: NEGATIVE

## 2023-12-08 LAB — CBC WITH DIFFERENTIAL/PLATELET
Abs Immature Granulocytes: 0.06 10*3/uL (ref 0.00–0.07)
Basophils Absolute: 0.1 10*3/uL (ref 0.0–0.1)
Basophils Relative: 1 %
Eosinophils Absolute: 0.2 10*3/uL (ref 0.0–0.5)
Eosinophils Relative: 2 %
HCT: 40 % (ref 36.0–46.0)
Hemoglobin: 13.4 g/dL (ref 12.0–15.0)
Immature Granulocytes: 1 %
Lymphocytes Relative: 30 %
Lymphs Abs: 2.9 10*3/uL (ref 0.7–4.0)
MCH: 27.7 pg (ref 26.0–34.0)
MCHC: 33.5 g/dL (ref 30.0–36.0)
MCV: 82.6 fL (ref 80.0–100.0)
Monocytes Absolute: 0.7 10*3/uL (ref 0.1–1.0)
Monocytes Relative: 7 %
Neutro Abs: 5.6 10*3/uL (ref 1.7–7.7)
Neutrophils Relative %: 59 %
Platelets: 171 10*3/uL (ref 150–400)
RBC: 4.84 MIL/uL (ref 3.87–5.11)
RDW: 16.7 % — ABNORMAL HIGH (ref 11.5–15.5)
WBC: 9.5 10*3/uL (ref 4.0–10.5)
nRBC: 0 % (ref 0.0–0.2)

## 2023-12-08 LAB — COMPREHENSIVE METABOLIC PANEL
ALT: 5 U/L (ref 0–44)
AST: 10 U/L — ABNORMAL LOW (ref 15–41)
Albumin: 4.3 g/dL (ref 3.5–5.0)
Alkaline Phosphatase: 78 U/L (ref 38–126)
Anion gap: 11 (ref 5–15)
BUN: 11 mg/dL (ref 6–20)
CO2: 23 mmol/L (ref 22–32)
Calcium: 9.7 mg/dL (ref 8.9–10.3)
Chloride: 105 mmol/L (ref 98–111)
Creatinine, Ser: 1.21 mg/dL — ABNORMAL HIGH (ref 0.44–1.00)
GFR, Estimated: 56 mL/min — ABNORMAL LOW (ref >60)
Glucose, Bld: 366 mg/dL — ABNORMAL HIGH (ref 70–99)
Potassium: 4.2 mmol/L (ref 3.5–5.1)
Sodium: 139 mmol/L (ref 135–145)
Total Bilirubin: 0.5 mg/dL (ref 0.0–1.2)
Total Protein: 7 g/dL (ref 6.5–8.1)

## 2023-12-08 MED ORDER — KETOROLAC TROMETHAMINE 30 MG/ML IJ SOLN
15.0000 mg | Freq: Once | INTRAMUSCULAR | Status: AC
  Administered 2023-12-08: 15 mg via INTRAVENOUS
  Filled 2023-12-08: qty 1

## 2023-12-08 MED ORDER — SODIUM CHLORIDE 0.9 % IV BOLUS
1000.0000 mL | Freq: Once | INTRAVENOUS | Status: AC
  Administered 2023-12-08: 1000 mL via INTRAVENOUS

## 2023-12-08 MED ORDER — OXYCODONE-ACETAMINOPHEN 5-325 MG PO TABS
2.0000 | ORAL_TABLET | Freq: Once | ORAL | Status: AC
  Administered 2023-12-08: 2 via ORAL
  Filled 2023-12-08: qty 2

## 2023-12-08 MED ORDER — OXYCODONE-ACETAMINOPHEN 5-325 MG PO TABS
1.0000 | ORAL_TABLET | Freq: Once | ORAL | Status: AC
  Administered 2023-12-08: 1 via ORAL
  Filled 2023-12-08: qty 1

## 2023-12-08 NOTE — ED Provider Notes (Signed)
 [place] EMERGENCY DEPARTMENT AT [facility] Provider Note   CSN: [id number] Arrival date & time: 12/08/23  7829     History  Chief Complaint  Patient presents with   Fall   Weakness    Jill Shaw is a 48 y.o. female.  HPI     This is a 48 year old female who presents with persistent generalized weakness and bodyaches.  Patient states over the last 7 to 10 days she has had bodyaches, nausea, diarrhea.  States that she fell on 2/14 because of generalized weakness.  She is taking oxycodone at home but this is not helping her body aches.  States that she has an allergy to Tylenol and ibuprofen so cannot take anti-inflammatories.  She has not had any documented fevers.  No known sick contacts.  Was seen and evaluated on 2/15 for abdominal pain.  She had a full workup including labs, COVID, influenza, and CT imaging.  This was largely reassuring.  There was some question of UTI on CT imaging although urinalysis did not support this and patient did not have any urinary symptoms.  Home Medications Prior to Admission medications   Medication Sig Start Date End Date Taking? Authorizing Provider  Accu-Chek Softclix Lancets lancets USE TO TEST AS DIRECTED UP TO FOUR TIMES DAILY. 03/06/22   Shamleffer, Konrad Dolores, MD  albuterol (VENTOLIN HFA) 108 (90 Base) MCG/ACT inhaler Inhale 2 puffs into the lungs every 6 (six) hours as needed for wheezing or shortness of breath. 11/26/23   Ivonne Andrew, NP  B-D ULTRAFINE III SHORT PEN 31G X 8 MM MISC USE AS DIRECTED EVERY MORNING, NOON, EVENING, AND AT BEDTIME 07/10/22   Shamleffer, Konrad Dolores, MD  budesonide-formoterol (SYMBICORT) 160-4.5 MCG/ACT inhaler Inhale 2 puffs into the lungs 2 (two) times daily. 11/26/23   Ivonne Andrew, NP  Continuous Blood Gluc Sensor (FREESTYLE LIBRE 3 SENSOR) MISC Place 1 sensor on the skin every 14 days. Use to check glucose continuously 07/06/22   Ivonne Andrew, NP  Dexlansoprazole 30 MG capsule  DR TAKE 1 CAPSULE (30 MG TOTAL) BY MOUTH DAILY. NEEDS OFFICE VISIT FOR ADDITIONAL REFILLS Patient taking differently: Take 30 mg by mouth daily as needed (for acid reflux). NEEDS OFFICE VISIT FOR ADDITIONAL REFILLS 03/26/23   Ivonne Andrew, NP  diclofenac Sodium (VOLTAREN) 1 % GEL Apply 2 to 4 gram onto painful sites up to 4 times daily if needed, max daily dose: 32 Gram Patient not taking: Reported on 12/04/2023 10/13/23     diclofenac Sodium (VOLTAREN) 1 % GEL Apply topically every 4 (four) hours as needed. Patient not taking: Reported on 12/04/2023 11/14/23     dicyclomine (BENTYL) 20 MG tablet Take 1 tablet (20 mg total) by mouth 2 (two) times daily. 11/26/23   Ivonne Andrew, NP  empagliflozin (JARDIANCE) 10 MG TABS tablet Take 1 tablet (10 mg total) by mouth daily. 11/26/23   Ivonne Andrew, NP  furosemide (LASIX) 40 MG tablet Take 1 tablet (40 mg total) by mouth daily. 11/26/23   Ivonne Andrew, NP  glucose blood (ACCU-CHEK GUIDE) test strip USE TO TEST BLOOD GLUCOSE AS DIRECTED UP TO 4 TIMES DAILY 11/14/22   Ivonne Andrew, NP  HUMALOG KWIKPEN 200 UNIT/ML KwikPen Inject 50 Units into the skin 3 (three) times daily before meals. 11/26/23   Ivonne Andrew, NP  hydrALAZINE (APRESOLINE) 25 MG tablet Take 1 tablet (25 mg total) by mouth 3 (three) times daily. 11/13/23   Tanda Rockers,  Gildardo Pounds, NP  melatonin 3 MG TABS tablet Take 1 tablet (3 mg total) by mouth at bedtime. Patient not taking: Reported on 12/04/2023 08/13/23   Ivonne Andrew, NP  metoCLOPramide (REGLAN) 10 MG tablet Take 1 tablet (10 mg total) by mouth every 8 (eight) hours as needed for nausea. 10/07/23   Rancour, Jeannett Senior, MD  metoprolol succinate (TOPROL-XL) 100 MG 24 hr tablet Take 1 tablet (100 mg total) by mouth daily. Take with or immediately following a meal. 11/26/23   Ivonne Andrew, NP  naloxone [facility]) nasal spray 4 mg/0.1 mL Insert 1 spray into one nostril if poorly responding / turning blue. CALL 911 ASAP Patient not  taking: Reported on 12/04/2023 10/13/23     nicotine (NICODERM CQ - DOSED IN MG/24 HOURS) 21 mg/24hr patch Place 1 patch (21 mg total) onto the skin daily. 10/11/23 10/10/24  Ivonne Andrew, NP  nicotine polacrilex (GOODSENSE NICOTINE) 2 MG gum Take 1 each (2 mg total) by mouth as needed for smoking cessation. Patient not taking: Reported on 12/04/2023 09/26/23   Ivonne Andrew, NP  nitroGLYCERIN (NITROSTAT) 0.4 MG SL tablet Place 1 tablet (0.4 mg total) under the tongue every 5 (five) minutes as needed for chest pain. Additional refills to be filled by PCP, patient aware 09/06/23   Ivonne Andrew, NP  norethindrone (AYGESTIN) 5 MG tablet Take 1 tablet (5 mg total) by mouth 2 (two) times daily. 11/19/23   Lorriane Shire, MD  ondansetron (ZOFRAN-ODT) 4 MG disintegrating tablet Take 1 tablet (4 mg total) by mouth every 8 (eight) hours as needed for nausea or vomiting. 12/01/23   Barrett, Horald Chestnut, PA-C  pantoprazole (PROTONIX) 20 MG tablet Take 1 tablet (20 mg total) by mouth daily. 12/01/23   Barrett, Horald Chestnut, PA-C  Pimozide 1 MG TABS Take 1 mg by mouth daily as needed (feeling of facial itching). 02/10/23   [provider]  polyethylene glycol (MIRALAX) 17 g packet Take 17 g by mouth daily as needed. 11/28/23   Ivonne Andrew, NP  potassium chloride SA (KLOR-CON M) 20 MEQ tablet Take 1 tablet (20 mEq total) by mouth daily. 10/29/23   Ivonne Andrew, NP  pregabalin (LYRICA) 50 MG capsule Take 1 capsule (50 mg total) by mouth at bedtime. 11/14/23     psyllium (METAMUCIL SMOOTH TEXTURE) 58.6 % powder Take 1 packet by mouth 3 (three) times daily. 11/13/23   Ivonne Andrew, NP  rosuvastatin (CRESTOR) 20 MG tablet Take 1 tablet (20 mg total) by mouth daily. 11/26/23   Ivonne Andrew, NP  senna-docusate (SENOKOT-S) 8.6-50 MG tablet Take 1 tablet by mouth daily. 08/06/23   Ivonne Andrew, NP  spironolactone (ALDACTONE) 25 MG tablet Take 1 tablet (25 mg total) by mouth daily. 11/26/23   Ivonne Andrew, NP  sucralfate (CARAFATE) 1 g tablet Take 1 tablet (1 g total) by mouth 4 (four) times daily -  with meals and at bedtime. 08/13/23   Zehr, Princella Pellegrini, PA-C  tiZANidine (ZANAFLEX) 4 MG tablet Take 1 tablet (4 mg total) by mouth every 6 (six) hours as needed for muscle spasms. 12/04/23   Ivonne Andrew, NP  TOUJEO MAX SOLOSTAR 300 UNIT/ML Solostar Pen Inject 40 Units into the skin in the morning. 07/31/23   Zannie Cove, MD  TOUJEO MAX SOLOSTAR 300 UNIT/ML Solostar Pen Inject 60 Units into the skin in the morning. 10/24/23   Ivonne Andrew, NP  Allergies    Elavil [amitriptyline], Orudis [ketoprofen], Desyrel [trazodone], Tylenol [acetaminophen], Aspirin, Motrin [ibuprofen], Naprosyn [naproxen], Neurontin [gabapentin], Sulfa antibiotics, Ultram [tramadol], Victoza [liraglutide], and Zegerid [omeprazole-sodium bicarbonate]    Review of Systems   Review of Systems  Constitutional:  Negative for fever.  Respiratory:  Negative for shortness of breath.   Cardiovascular:  Negative for chest pain.  Gastrointestinal:  Positive for abdominal pain, diarrhea, nausea and vomiting.  All other systems reviewed and are negative.   Physical Exam Updated Vital Signs BP (!) 155/85   Pulse 71   Temp 97.6 F (36.4 C) (Temporal)   Resp (!) 21   Ht 1.626 m (5\' 4" )   Wt 122.5 kg   SpO2 100%   BMI 46.35 kg/m  Physical Exam Vitals and nursing note reviewed.  Constitutional:      Appearance: She is well-developed. She is obese. She is not ill-appearing.  HENT:     Head: Normocephalic and atraumatic.     Mouth/Throat:     Mouth: Mucous membranes are moist.  Eyes:     Pupils: Pupils are equal, round, and reactive to light.  Cardiovascular:     Rate and Rhythm: Normal rate and regular rhythm.     Heart sounds: Normal heart sounds.  Pulmonary:     Effort: Pulmonary effort is normal. No respiratory distress.     Breath sounds: No wheezing.  Abdominal:     Palpations: Abdomen is soft.      Tenderness: There is no abdominal tenderness. There is no guarding or rebound.  Musculoskeletal:     Cervical back: Neck supple.     Comments: Tenderness to palpation right wrist, no obvious deformity  Skin:    General: Skin is warm and dry.  Neurological:     Mental Status: She is alert and oriented to person, place, and time.  Psychiatric:        Mood and Affect: Mood normal.     ED Results / Procedures / Treatments   Labs (all labs ordered are listed, but only abnormal results are displayed) Labs Reviewed  CBC WITH DIFFERENTIAL/PLATELET - Abnormal; Notable for the following components:      Result Value   RDW 16.7 (*)    All other components within normal limits  COMPREHENSIVE METABOLIC PANEL - Abnormal; Notable for the following components:   Glucose, Bld 366 (*)    Creatinine, Ser 1.21 (*)    AST 10 (*)    GFR, Estimated 56 (*)    All other components within normal limits  RESP PANEL BY RT-PCR (RSV, FLU A&B, COVID)  RVPGX2  URINALYSIS, ROUTINE W REFLEX MICROSCOPIC    EKG None  Radiology DG Wrist Complete Right Result Date: 12/08/2023 CLINICAL DATA:  Fall.  Right wrist pain. EXAM: RIGHT WRIST - COMPLETE 3+ VIEW COMPARISON:  None Available. FINDINGS: There is no evidence of fracture or dislocation. There is no evidence of arthropathy or other focal bone abnormality. Soft tissues are unremarkable. IMPRESSION: Negative. Electronically Signed   By: Kennith Center M.D.   On: 12/08/2023 05:05    Procedures Procedures    Medications Ordered in ED Medications  oxyCODONE-acetaminophen (PERCOCET/ROXICET) 5-325 MG per tablet 2 tablet (has no administration in time range)  sodium chloride 0.9 % bolus 1,000 mL (0 mLs Intravenous Stopped 12/08/23 0534)  oxyCODONE-acetaminophen (PERCOCET/ROXICET) 5-325 MG per tablet 1 tablet (1 tablet Oral Given 12/08/23 0428)  ketorolac (TORADOL) 30 MG/ML injection 15 mg (15 mg Intravenous Given 12/08/23 0530)  ED Course/ Medical Decision  Making/ A&P                                 Medical Decision Making Amount and/or Complexity of Data Reviewed Labs: ordered. Radiology: ordered.  Risk Prescription drug management.   This patient presents to the ED for concern of generalized weakness, viral symptoms, this involves an extensive number of treatment options, and is a complaint that carries with it a high risk of complications and morbidity.  I considered the following differential and admission for this acute, potentially life threatening condition.  The differential diagnosis includes viral etiology such as COVID, influenza, norovirus, dehydration  MDM:    This is a 48 year old female who presents with generalized weakness and bodyaches.  She is nontoxic-appearing.  Reports a fall where she sustained injury to the right wrist.  Patient was given fluids, Percocet, and Toradol.  Labs obtained.  Largely reassuring.  She does have persistent hyperglycemia but no anion gap.  X-ray of the right wrist without fracture.  On multiple occasions, patient requesting IV narcotic pain medication.  Do not see an indication for IV narcotic pain medication.  She has multiple allergies but did tolerate Toradol in the emergency department.  Discussed with patient that she would likely benefit from an anti-inflammatory given her body aches and viral symptoms.  Her COVID and influenza have stayed negative.  We discussed ongoing supportive measures.  No indication for advanced imaging today.  Patient ambulated without difficulty.  Did report improvement of generalized weakness after fluids.  Had ongoing myalgias.  (Labs, imaging, consults)  Labs: I Ordered, and personally interpreted labs.  The pertinent results include: CBC, CMP, COVID, influenza   Imaging Studies ordered: I ordered imaging studies including none I independently visualized and interpreted imaging. I agree with the radiologist interpretation  Additional history obtained from  chart review.  External records from outside source obtained and reviewed including prior evaluations  Cardiac Monitoring: The patient was maintained on a cardiac monitor.  If on the cardiac monitor, I personally viewed and interpreted the cardiac monitored which showed an underlying rhythm of: Sinus  Reevaluation: After the interventions noted above, I reevaluated the patient and found that they have :stayed the same  Social Determinants of Health:  lives independently  Disposition: discharge  Co morbidities that complicate the patient evaluation  Past Medical History:  Diagnosis Date   Abnormal uterine bleeding (AUB)    Arthritis    knees, hands   Atypical chest pain    cardiology--- dr Rosemary Holms did cardiac cath 08-30-2021 showed minimal luminal irregularity involving LAD all other coronaries w/ normal  flow   Chronic diastolic (congestive) heart failure [facility])    cardiologist---- dr Rosemary Holms;  preserved ef   Chronic iron deficiency anemia    Chronic pain syndrome    followed by pain management--- dr Carlis Abbott   CKD (chronic kidney disease), stage III (HCC)    Diabetic gastroparesis (HCC)    Diabetic peripheral neuropathy (HCC)    Fibromyalgia    GAD (generalized anxiety disorder)    Generalized abdominal pain    GERD (gastroesophageal reflux disease)    History of acute renal failure 03/21/2023   admission in epic due to N/V/D due ot severe sepsis POA due to UTI   History of chronic gastritis    inflammatory   History of diabetic ketoacidosis    multiple admission's last 3 in epic 06/ 2022;  01/ 2022;   09/ 2021   History of seizure 01/2016   hypoglycemic seizure   History of vertebral compression fracture 12/2020   T11 -- T12 & L1   Hyperlipidemia    Hypertension    followed by pcp   Insulin dependent type 2 diabetes mellitus (HCC)    uncontrolled,  followed by pcp   Irritable bowel syndrome with constipation    MDD (major depressive disorder)    Moderate COPD  (chronic obstructive pulmonary disease) (HCC)    pulmology--- dr Tonia Brooms   Moderate persistent asthma    Sickle cell trait (HCC)    Vitamin D deficiency 10/2019   Wears glasses      Medicines Meds ordered this encounter  Medications   sodium chloride 0.9 % bolus 1,000 mL   oxyCODONE-acetaminophen (PERCOCET/ROXICET) 5-325 MG per tablet 1 tablet    Refill:  0   ketorolac (TORADOL) 30 MG/ML injection 15 mg   oxyCODONE-acetaminophen (PERCOCET/ROXICET) 5-325 MG per tablet 2 tablet    Refill:  0    I have reviewed the patients home medicines and have made adjustments as needed  Problem List / ED Course: Problem List Items Addressed This Visit   None Visit Diagnoses       Viral illness    -  Primary     Fall, initial encounter         Right wrist pain                       Final Clinical Impression(s) / ED Diagnoses Final diagnoses:  Viral illness  Fall, initial encounter  Right wrist pain    Rx / DC Orders ED Discharge Orders     None         Shon Baton, MD 12/08/23 [phone]

## 2023-12-08 NOTE — ED Notes (Signed)
 Pt able to tolerate PO fluids. Ambulated with pt in the hall appx 50 ft. Pt states she is still in 8/10 pain, but doesn't feel as week since receiving fluids

## 2023-12-08 NOTE — ED Triage Notes (Signed)
 Pt reports a fall on 2/14. Pt sts she had been feeling weak all over causing her to fall. Denies dizziness prior to fall and at this time. Has had ongoing NVD since 2/10. Pt reports ongoing generalized body aches, lower back pain, and right wrist. Took prescribed oxy 11 pm without relief. Pt ambulatory to triage.

## 2023-12-08 NOTE — Discharge Instructions (Signed)
 You were seen today for ongoing weakness and bodyaches.  You likely have a viral illness.  Continue your medications at home.  You can add naproxen or an anti-inflammatory if you can tolerate it.  Make sure that you are staying hydrated.

## 2023-12-10 ENCOUNTER — Other Ambulatory Visit: Payer: Self-pay

## 2023-12-11 ENCOUNTER — Telehealth: Payer: Self-pay

## 2023-12-11 NOTE — Transitions of Care (Post Inpatient/ED Visit) (Signed)
   12/11/2023  Name: Jill Shaw MRN: [id number] DOB: 1976/02/18  Today's TOC FU Call Status: Today's TOC FU Call Status:: Unsuccessful Call (1st Attempt) Unsuccessful Call (1st Attempt) Date: 12/11/23  Attempted to reach the patient regarding the most recent Inpatient/ED visit.  Follow Up Plan: Additional outreach attempts will be made to reach the patient to complete the Transitions of Care (Post Inpatient/ED visit) call.   Signature  [employer], [place]

## 2023-12-12 ENCOUNTER — Emergency Department (HOSPITAL_BASED_OUTPATIENT_CLINIC_OR_DEPARTMENT_OTHER)
Admission: EM | Admit: 2023-12-12 | Discharge: 2023-12-12 | Disposition: A | Discharge status: Home / Self Care | Payer: Medicare Other | Attending: Emergency Medicine | Admitting: Emergency Medicine

## 2023-12-12 ENCOUNTER — Encounter (HOSPITAL_BASED_OUTPATIENT_CLINIC_OR_DEPARTMENT_OTHER): Payer: Self-pay | Admitting: Emergency Medicine

## 2023-12-12 ENCOUNTER — Other Ambulatory Visit: Payer: Self-pay

## 2023-12-12 DIAGNOSIS — I509 Heart failure, unspecified: Secondary | ICD-10-CM | POA: Diagnosis not present

## 2023-12-12 DIAGNOSIS — Z79899 Other long term (current) drug therapy: Secondary | ICD-10-CM | POA: Insufficient documentation

## 2023-12-12 DIAGNOSIS — E1122 Type 2 diabetes mellitus with diabetic chronic kidney disease: Secondary | ICD-10-CM | POA: Diagnosis not present

## 2023-12-12 DIAGNOSIS — J4540 Moderate persistent asthma, uncomplicated: Secondary | ICD-10-CM | POA: Diagnosis not present

## 2023-12-12 DIAGNOSIS — N189 Chronic kidney disease, unspecified: Secondary | ICD-10-CM | POA: Insufficient documentation

## 2023-12-12 DIAGNOSIS — E1165 Type 2 diabetes mellitus with hyperglycemia: Secondary | ICD-10-CM | POA: Insufficient documentation

## 2023-12-12 DIAGNOSIS — R739 Hyperglycemia, unspecified: Secondary | ICD-10-CM

## 2023-12-12 DIAGNOSIS — I130 Hypertensive heart and chronic kidney disease with heart failure and stage 1 through stage 4 chronic kidney disease, or unspecified chronic kidney disease: Secondary | ICD-10-CM | POA: Insufficient documentation

## 2023-12-12 DIAGNOSIS — Z7984 Long term (current) use of oral hypoglycemic drugs: Secondary | ICD-10-CM | POA: Insufficient documentation

## 2023-12-12 DIAGNOSIS — G894 Chronic pain syndrome: Secondary | ICD-10-CM | POA: Insufficient documentation

## 2023-12-12 DIAGNOSIS — Z794 Long term (current) use of insulin: Secondary | ICD-10-CM | POA: Insufficient documentation

## 2023-12-12 DIAGNOSIS — J449 Chronic obstructive pulmonary disease, unspecified: Secondary | ICD-10-CM | POA: Insufficient documentation

## 2023-12-12 LAB — COMPREHENSIVE METABOLIC PANEL
ALT: <5 U/L (ref 0–44)
AST: 9 U/L — ABNORMAL LOW (ref 15–41)
Albumin: 4.1 g/dL (ref 3.5–5.0)
Alkaline Phosphatase: 84 U/L (ref 38–126)
Anion gap: 10 (ref 5–15)
BUN: 10 mg/dL (ref 6–20)
CO2: 22 mmol/L (ref 22–32)
Calcium: 9 mg/dL (ref 8.9–10.3)
Chloride: 107 mmol/L (ref 98–111)
Creatinine, Ser: 1.05 mg/dL — ABNORMAL HIGH (ref 0.44–1.00)
GFR, Estimated: >60 mL/min (ref >60)
Glucose, Bld: 422 mg/dL — ABNORMAL HIGH (ref 70–99)
Potassium: 4.1 mmol/L (ref 3.5–5.1)
Sodium: 139 mmol/L (ref 135–145)
Total Bilirubin: 0.5 mg/dL (ref 0.0–1.2)
Total Protein: 6.3 g/dL — ABNORMAL LOW (ref 6.5–8.1)

## 2023-12-12 LAB — CBC
HCT: 39.1 % (ref 36.0–46.0)
Hemoglobin: 13.4 g/dL (ref 12.0–15.0)
MCH: 28.3 pg (ref 26.0–34.0)
MCHC: 34.3 g/dL (ref 30.0–36.0)
MCV: 82.7 fL (ref 80.0–100.0)
Platelets: 165 10*3/uL (ref 150–400)
RBC: 4.73 MIL/uL (ref 3.87–5.11)
RDW: 16.6 % — ABNORMAL HIGH (ref 11.5–15.5)
WBC: 8 10*3/uL (ref 4.0–10.5)
nRBC: 0 % (ref 0.0–0.2)

## 2023-12-12 LAB — CBG MONITORING, ED: Glucose-Capillary: 330 mg/dL — ABNORMAL HIGH (ref 70–99)

## 2023-12-12 LAB — LIPASE, BLOOD: Lipase: 18 U/L (ref 11–51)

## 2023-12-12 MED ORDER — METOCLOPRAMIDE HCL 5 MG/ML IJ SOLN
10.0000 mg | Freq: Once | INTRAMUSCULAR | Status: AC
  Administered 2023-12-12: 10 mg via INTRAVENOUS
  Filled 2023-12-12: qty 2

## 2023-12-12 MED ORDER — HYDROMORPHONE HCL 1 MG/ML IJ SOLN
1.0000 mg | Freq: Once | INTRAMUSCULAR | Status: AC
  Administered 2023-12-12: 1 mg via INTRAVENOUS
  Filled 2023-12-12: qty 1

## 2023-12-12 MED ORDER — INSULIN ASPART 100 UNIT/ML IJ SOLN
6.0000 [IU] | Freq: Once | INTRAMUSCULAR | Status: AC
  Administered 2023-12-12: 6 [IU] via SUBCUTANEOUS

## 2023-12-12 NOTE — ED Notes (Signed)
 Pt unable to provide a urine sample at this time.

## 2023-12-12 NOTE — Discharge Instructions (Signed)
 You have been seen today for your complaint of pain all over. Your lab work was reassuring.  Your blood sugar is high.  Continue checking this at home and take your medicines as prescribed. Your discharge medications include your home medicines. Follow up with: Your pain clinic as scheduled Please seek immediate medical care if you develop any of the following symptoms: Your pain suddenly gets much worse. You develop chest pain. You have trouble breathing or shortness of breath. You faint, or another person sees you faint. At this time there does not appear to be the presence of an emergent medical condition, however there is always the potential for conditions to change. Please read and follow the below instructions.  Do not take your medicine if  develop an itchy rash, swelling in your mouth or lips, or difficulty breathing; call 911 and seek immediate emergency medical attention if this occurs.  You may review your lab tests and imaging results in their entirety on your MyChart account.  Please discuss all results of fully with your primary care provider and other specialist at your follow-up visit.  Note: Portions of this text may have been transcribed using voice recognition software. Every effort was made to ensure accuracy; however, inadvertent computerized transcription errors may still be present.

## 2023-12-12 NOTE — ED Triage Notes (Addendum)
 Chronic generalized pain, worse in abd,  since yesterday. Took oxycodone today without relief. Hx of arthritis

## 2023-12-12 NOTE — ED Provider Notes (Signed)
  EMERGENCY DEPARTMENT AT [facility] Provider Note   CSN: [id number] Arrival date & time: 12/12/23  0827     History  Chief Complaint  Patient presents with   pain    Jill Shaw is a 48 y.o. female.  With an extensive past medical history including but not limited to hypertension, moderate persistent asthma, arthritis, vitamin D deficiency, chronic gastritis, IDDM 2, COPD, fibromyalgia, CKD, IBS, chronic pain syndrome, diabetic gastroparesis, hyperlipidemia, anxiety, depression, CHF presenting to the ED for evaluation of pain.  Pain is generalized but includes bilateral knees, shoulders, abdomen.  She says it feels like her typical chronic pain which tends to wax and wane in intensity.  She has been taking her 10 mg oxycodone every 4 hours as prescribed with minimal improvement in her symptoms.  She reports some nausea but no vomiting.  No fevers or chills.  No dysuria, frequency, urgency, vaginal pain or discharge.  She has an appointment with her pain clinic on Friday.  She did not use her insulin this morning.  HPI     Home Medications Prior to Admission medications   Medication Sig Start Date End Date Taking? Authorizing Provider  tizanidine (ZANAFLEX) 6 MG capsule Take 6 mg by mouth 3 (three) times daily. 11/27/23  Yes [provider]  topiramate (TOPAMAX) 25 MG tablet Take 25 mg by mouth at bedtime. 10/21/23  Yes [provider]  Accu-Chek Softclix Lancets lancets USE TO TEST AS DIRECTED UP TO FOUR TIMES DAILY. 03/06/22   Shamleffer, Konrad Dolores, MD  albuterol (VENTOLIN HFA) 108 (90 Base) MCG/ACT inhaler Inhale 2 puffs into the lungs every 6 (six) hours as needed for wheezing or shortness of breath. 11/26/23   Ivonne Andrew, NP  B-D ULTRAFINE III SHORT PEN 31G X 8 MM MISC USE AS DIRECTED EVERY MORNING, NOON, EVENING, AND AT BEDTIME 07/10/22   Shamleffer, Konrad Dolores, MD  budesonide-formoterol (SYMBICORT) 160-4.5 MCG/ACT inhaler Inhale  2 puffs into the lungs 2 (two) times daily. 11/26/23   Ivonne Andrew, NP  Continuous Blood Gluc Sensor (FREESTYLE LIBRE 3 SENSOR) MISC Place 1 sensor on the skin every 14 days. Use to check glucose continuously 07/06/22   Ivonne Andrew, NP  Dexlansoprazole 30 MG capsule DR TAKE 1 CAPSULE (30 MG TOTAL) BY MOUTH DAILY. NEEDS OFFICE VISIT FOR ADDITIONAL REFILLS Patient taking differently: Take 30 mg by mouth daily as needed (for acid reflux). NEEDS OFFICE VISIT FOR ADDITIONAL REFILLS 03/26/23   Ivonne Andrew, NP  diclofenac Sodium (VOLTAREN) 1 % GEL Apply 2 to 4 gram onto painful sites up to 4 times daily if needed, max daily dose: 32 Gram Patient not taking: Reported on 12/04/2023 10/13/23     diclofenac Sodium (VOLTAREN) 1 % GEL Apply topically every 4 (four) hours as needed. Patient not taking: Reported on 12/04/2023 11/14/23     dicyclomine (BENTYL) 20 MG tablet Take 1 tablet (20 mg total) by mouth 2 (two) times daily. 11/26/23   Ivonne Andrew, NP  empagliflozin (JARDIANCE) 10 MG TABS tablet Take 1 tablet (10 mg total) by mouth daily. 11/26/23   Ivonne Andrew, NP  furosemide (LASIX) 40 MG tablet Take 1 tablet (40 mg total) by mouth daily. 11/26/23   Ivonne Andrew, NP  glucose blood (ACCU-CHEK GUIDE) test strip USE TO TEST BLOOD GLUCOSE AS DIRECTED UP TO 4 TIMES DAILY 11/14/22   Ivonne Andrew, NP  HUMALOG KWIKPEN 200 UNIT/ML KwikPen Inject 50 Units into the  skin 3 (three) times daily before meals. 11/26/23   Ivonne Andrew, NP  hydrALAZINE (APRESOLINE) 25 MG tablet Take 1 tablet (25 mg total) by mouth 3 (three) times daily. 11/13/23   Ivonne Andrew, NP  melatonin 3 MG TABS tablet Take 1 tablet (3 mg total) by mouth at bedtime. Patient not taking: Reported on 12/04/2023 08/13/23   Ivonne Andrew, NP  metoCLOPramide (REGLAN) 10 MG tablet Take 1 tablet (10 mg total) by mouth every 8 (eight) hours as needed for nausea. 10/07/23   Rancour, Jeannett Senior, MD  metoprolol succinate (TOPROL-XL) 100  MG 24 hr tablet Take 1 tablet (100 mg total) by mouth daily. Take with or immediately following a meal. 11/26/23   Ivonne Andrew, NP  naloxone [facility]) nasal spray 4 mg/0.1 mL Insert 1 spray into one nostril if poorly responding / turning blue. CALL 911 ASAP Patient not taking: Reported on 12/04/2023 10/13/23     nicotine (NICODERM CQ - DOSED IN MG/24 HOURS) 21 mg/24hr patch Place 1 patch (21 mg total) onto the skin daily. 10/11/23 10/10/24  Ivonne Andrew, NP  nicotine polacrilex (GOODSENSE NICOTINE) 2 MG gum Take 1 each (2 mg total) by mouth as needed for smoking cessation. Patient not taking: Reported on 12/04/2023 09/26/23   Ivonne Andrew, NP  nitroGLYCERIN (NITROSTAT) 0.4 MG SL tablet Place 1 tablet (0.4 mg total) under the tongue every 5 (five) minutes as needed for chest pain. Additional refills to be filled by PCP, patient aware 09/06/23   Ivonne Andrew, NP  norethindrone (AYGESTIN) 5 MG tablet Take 1 tablet (5 mg total) by mouth 2 (two) times daily. 11/19/23   Lorriane Shire, MD  ondansetron (ZOFRAN-ODT) 4 MG disintegrating tablet Take 1 tablet (4 mg total) by mouth every 8 (eight) hours as needed for nausea or vomiting. 12/01/23   Barrett, Horald Chestnut, PA-C  pantoprazole (PROTONIX) 20 MG tablet Take 1 tablet (20 mg total) by mouth daily. 12/01/23   Barrett, Horald Chestnut, PA-C  Pimozide 1 MG TABS Take 1 mg by mouth daily as needed (feeling of facial itching). 02/10/23   [provider]  polyethylene glycol (MIRALAX) 17 g packet Take 17 g by mouth daily as needed. 11/28/23   Ivonne Andrew, NP  potassium chloride SA (KLOR-CON M) 20 MEQ tablet Take 1 tablet (20 mEq total) by mouth daily. 10/29/23   Ivonne Andrew, NP  pregabalin (LYRICA) 50 MG capsule Take 1 capsule (50 mg total) by mouth at bedtime. 11/14/23     psyllium (METAMUCIL SMOOTH TEXTURE) 58.6 % powder Take 1 packet by mouth 3 (three) times daily. 11/13/23   Ivonne Andrew, NP  rosuvastatin (CRESTOR) 20 MG tablet Take 1  tablet (20 mg total) by mouth daily. 11/26/23   Ivonne Andrew, NP  senna-docusate (SENOKOT-S) 8.6-50 MG tablet Take 1 tablet by mouth daily. 08/06/23   Ivonne Andrew, NP  spironolactone (ALDACTONE) 25 MG tablet Take 1 tablet (25 mg total) by mouth daily. 11/26/23   Ivonne Andrew, NP  sucralfate (CARAFATE) 1 g tablet Take 1 tablet (1 g total) by mouth 4 (four) times daily -  with meals and at bedtime. 08/13/23   Zehr, Princella Pellegrini, PA-C  tiZANidine (ZANAFLEX) 4 MG tablet Take 1 tablet (4 mg total) by mouth every 6 (six) hours as needed for muscle spasms. 12/04/23   Ivonne Andrew, NP  TOUJEO MAX SOLOSTAR 300 UNIT/ML Solostar Pen Inject 40 Units into the skin in the  morning. 07/31/23   Zannie Cove, MD  TOUJEO MAX SOLOSTAR 300 UNIT/ML Solostar Pen Inject 60 Units into the skin in the morning. 10/24/23   Ivonne Andrew, NP      Allergies    Elavil [amitriptyline], Orudis [ketoprofen], Desyrel [trazodone], Tylenol [acetaminophen], Aspirin, Motrin [ibuprofen], Naprosyn [naproxen], Neurontin [gabapentin], Sulfa antibiotics, Ultram [tramadol], Victoza [liraglutide], and Zegerid [omeprazole-sodium bicarbonate]    Review of Systems   Review of Systems  Gastrointestinal:  Positive for abdominal pain.  Musculoskeletal:  Positive for arthralgias.  All other systems reviewed and are negative.   Physical Exam Updated Vital Signs BP 136/67   Pulse (!) 59   Temp 98.4 F (36.9 C) (Oral)   Resp 20   Ht 5\' 4"  (1.626 m)   Wt 122.5 kg   SpO2 98%   BMI 46.35 kg/m  Physical Exam Vitals and nursing note reviewed.  Constitutional:      General: She is not in acute distress.    Appearance: Normal appearance. She is obese.     Comments: Resting in bed  HENT:     Head: Normocephalic and atraumatic.  Cardiovascular:     Rate and Rhythm: Normal rate and regular rhythm.  Pulmonary:     Effort: Pulmonary effort is normal. No respiratory distress.     Breath sounds: No wheezing, rhonchi or rales.   Abdominal:     General: Abdomen is flat.     Tenderness: There is abdominal tenderness (Generalized). There is no guarding.  Musculoskeletal:        General: Normal range of motion.     Cervical back: Neck supple.  Skin:    General: Skin is warm and dry.  Neurological:     Mental Status: She is alert and oriented to person, place, and time.  Psychiatric:        Mood and Affect: Mood normal.        Behavior: Behavior normal.     ED Results / Procedures / Treatments   Labs (all labs ordered are listed, but only abnormal results are displayed) Labs Reviewed  COMPREHENSIVE METABOLIC PANEL - Abnormal; Notable for the following components:      Result Value   Glucose, Bld 422 (*)    Creatinine, Ser 1.05 (*)    Total Protein 6.3 (*)    AST 9 (*)    All other components within normal limits  CBC - Abnormal; Notable for the following components:   RDW 16.6 (*)    All other components within normal limits  LIPASE, BLOOD  URINALYSIS, ROUTINE W REFLEX MICROSCOPIC  PREGNANCY, URINE  CBG MONITORING, ED    EKG None  Radiology No results found.  Procedures Procedures    Medications Ordered in ED Medications  insulin aspart (novoLOG) injection 6 Units (6 Units Subcutaneous Given 12/12/23 1102)  HYDROmorphone (DILAUDID) injection 1 mg (1 mg Intravenous Given 12/12/23 1057)  metoCLOPramide (REGLAN) injection 10 mg (10 mg Intravenous Given 12/12/23 1058)    ED Course/ Medical Decision Making/ A&P                                 Medical Decision Making Amount and/or Complexity of Data Reviewed Labs: ordered.  Risk Prescription drug management.  This patient presents to the ED for concern of acute on chronic generalized pain, this involves an extensive number of treatment options, and is a complaint that carries with it a high risk of  complications and morbidity.  The differential diagnosis includes chronic pain, gastroparesis, DKA  My initial workup includes labs, symptom  control  Additional history obtained from: Nursing notes from this visit. Previous records within EMR system 7 CT scans of the abdomen and pelvis within the past year without any emergent abnormalities  I ordered, reviewed and interpreted labs which include: CBC, CMP, lipase, urinalysis, urine pregnancy.  Hyperglycemia of 422 with normal anion gap.  Creatinine near baseline at 1.05.  No electrolyte derangements.  No leukocytosis or anemia.  Lipase normal.  CBG after insulin 330  Afebrile, hemodynamically stable.  48 year old female presenting to the ED for evaluation of acute on chronic pain.  She states her home pain medications have not been working.  She describes the pain as typical for her chronic pain.  She has an appointment with her pain clinic in 2 days.  Lab workup was reassuring outside of hyperglycemia 422.  She states her blood sugars typically run all over the place.  She has not taken her insulin today.  She was given a dose of NovoLog in the emergency department and had improvement in her blood sugar.  Anion gap normal, low suspicion for DKA.  Normal mentation, low suspicion for HHS.  Suspect acute on chronic pain, potential gastroparesis as well.  She reported significant improvement in her symptoms after treatment in the emergency department.  She was encouraged to follow-up with her primary care provider.  She was given return precautions.  Stable at discharge.  At this time there does not appear to be any evidence of an acute emergency medical condition and the patient appears stable for discharge with appropriate outpatient follow up. Diagnosis was discussed with patient who verbalizes understanding of care plan and is agreeable to discharge. I have discussed return precautions with patient who verbalizes understanding. Patient encouraged to follow-up with their PCP within 1 week. All questions answered.  Note: Portions of this report may have been transcribed using voice recognition  software. Every effort was made to ensure accuracy; however, inadvertent computerized transcription errors may still be present.        Final Clinical Impression(s) / ED Diagnoses Final diagnoses:  Chronic pain syndrome  Hyperglycemia    Rx / DC Orders ED Discharge Orders     None         Michelle Piper, PA-C 12/12/23 1249    Pricilla Loveless, MD 12/12/23 [phone]

## 2023-12-13 ENCOUNTER — Encounter: Payer: Medicare Other | Admitting: Physical Medicine and Rehabilitation

## 2023-12-14 ENCOUNTER — Other Ambulatory Visit: Payer: Self-pay

## 2023-12-14 ENCOUNTER — Inpatient Hospital Stay: Payer: Self-pay | Admitting: Nurse Practitioner

## 2023-12-14 MED ORDER — PREGABALIN 75 MG PO CAPS
ORAL_CAPSULE | ORAL | 0 refills | Status: DC
Start: 2023-12-14 — End: 2024-02-04
  Filled 2023-12-14: qty 60, 30d supply, fill #0

## 2023-12-14 MED ORDER — DICLOFENAC SODIUM 1 % EX GEL
2.0000 g | Freq: Four times a day (QID) | CUTANEOUS | 0 refills | Status: DC | PRN
Start: 2023-12-14 — End: 2024-02-04
  Filled 2023-12-14: qty 100, 6d supply, fill #0

## 2023-12-14 MED ORDER — OXYCODONE HCL 10 MG PO TABS
10.0000 mg | ORAL_TABLET | Freq: Four times a day (QID) | ORAL | 0 refills | Status: DC
  Filled 2023-12-14: qty 120, 30d supply, fill #0

## 2023-12-17 ENCOUNTER — Other Ambulatory Visit: Payer: Self-pay | Admitting: Nurse Practitioner

## 2023-12-17 ENCOUNTER — Other Ambulatory Visit: Payer: Self-pay

## 2023-12-17 DIAGNOSIS — E119 Type 2 diabetes mellitus without complications: Secondary | ICD-10-CM

## 2023-12-17 MED ORDER — HUMALOG KWIKPEN 200 UNIT/ML ~~LOC~~ SOPN
50.0000 [IU] | PEN_INJECTOR | Freq: Three times a day (TID) | SUBCUTANEOUS | 0 refills | Status: DC
  Filled 2023-12-17: qty 60, 80d supply, fill #0

## 2023-12-18 ENCOUNTER — Other Ambulatory Visit: Payer: Self-pay | Admitting: Nurse Practitioner

## 2023-12-18 ENCOUNTER — Other Ambulatory Visit: Payer: Self-pay

## 2023-12-18 ENCOUNTER — Telehealth: Payer: Self-pay | Admitting: Nurse Practitioner

## 2023-12-18 DIAGNOSIS — I5032 Chronic diastolic (congestive) heart failure: Secondary | ICD-10-CM

## 2023-12-18 MED ORDER — SPIRONOLACTONE 25 MG PO TABS
25.0000 mg | ORAL_TABLET | Freq: Every day | ORAL | 0 refills | Status: DC
Start: 2023-12-18 — End: 2024-01-17
  Filled 2023-12-18 – 2023-12-27 (×3): qty 30, 30d supply, fill #0

## 2023-12-18 MED ORDER — METOPROLOL SUCCINATE ER 100 MG PO TB24
100.0000 mg | ORAL_TABLET | Freq: Every day | ORAL | 0 refills | Status: DC
  Filled 2023-12-18 – 2023-12-27 (×3): qty 30, 30d supply, fill #0

## 2023-12-18 NOTE — Telephone Encounter (Unsigned)
 Copied from CRM [id number]. Topic: Clinical - Medication Refill >> Dec 18, 2023 12:19 PM Marlow Baars wrote: Most Recent Primary Care Visit:  Provider: Ivonne Andrew  Department: [facility]  Visit Type: HOSPITAL FOLLOW UP  Date: 11/26/2023  Medication: tiZANidine (ZANAFLEX) 4 MG tablet  Has the patient contacted their pharmacy? Yes   Is this the correct pharmacy for this prescription? Yes If no, delete pharmacy and type the correct one.  This is the patient's preferred pharmacy:  [facility] [address] [place] [address] Phone: [phone] Fax: [phone]     Has the prescription been filled recently? Yes a few weeks ago  Is the patient out of the medication? Yes she said she is out as of today  Has the patient been seen for an appointment in the last year OR does the patient have an upcoming appointment? Yes  Can we respond through MyChart? Yes  Please assist patient further

## 2023-12-18 NOTE — Telephone Encounter (Signed)
 Last Fill: 12/04/23 60 tabs/0 RF  Last OV: 11/26/23 Hosp FU Next OV: 12/21/23 Hosp FU  Routing to provider for review/authorization.   Copied from CRM [id number]. Topic: Clinical - Medication Refill >> Dec 18, 2023 12:19 PM Marlow Baars wrote: Most Recent Primary Care Visit:  Provider: Ivonne Andrew  Department: [facility]  Visit Type: HOSPITAL FOLLOW UP  Date: 11/26/2023  Medication: tiZANidine (ZANAFLEX) 4 MG tablet  Has the patient contacted their pharmacy? Yes   Is this the correct pharmacy for this prescription? Yes If no, delete pharmacy and type the correct one.  This is the patient's preferred pharmacy:  [facility] [address] [place] [address] Phone: [phone] Fax: [phone]     Has the prescription been filled recently? Yes a few weeks ago  Is the patient out of the medication? Yes she said she is out as of today  Has the patient been seen for an appointment in the last year OR does the patient have an upcoming appointment? Yes  Can we respond through MyChart? Yes  Please assist patient further

## 2023-12-18 NOTE — Telephone Encounter (Signed)
 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 The patient is running out the medication because she is taking more than the prescribed dose and is requesting a refill early. That is why insurance is not covering the requested refill, it is too soon to refill based on the Rx sig.    Patient states her blood sugar is not controlled with taking 50 units 3x daily before meals so she self increased her dose to 70 units 3x daily before meals. Patient is requesting PCP change dose to 70 units 3x daily before meals so she does not run out of insulin. Please advise

## 2023-12-18 NOTE — Telephone Encounter (Signed)
 Copied from CRM [id number]. Topic: Clinical - Prescription Issue >> Dec 18, 2023  9:35 AM Franchot Heidelberg wrote: Reason for CRM: Pt called reporting that her insurance will not cover her prescription unless her PCP increases the injection dose from 50 to something higher (Humalog KwikPen) states she explained this to a RN yesterday but the wrong dose was still called in. Pt is upset and states she is in need of her insulin.   Best contact: [phone]

## 2023-12-21 ENCOUNTER — Other Ambulatory Visit: Payer: Self-pay

## 2023-12-21 ENCOUNTER — Ambulatory Visit: Payer: Self-pay | Admitting: Nurse Practitioner

## 2023-12-21 ENCOUNTER — Encounter: Payer: Self-pay | Admitting: Nurse Practitioner

## 2023-12-21 VITALS — BP 148/92 | HR 92 | Temp 97.2°F | Wt 283.6 lb

## 2023-12-21 DIAGNOSIS — I10 Essential (primary) hypertension: Secondary | ICD-10-CM | POA: Diagnosis not present

## 2023-12-21 DIAGNOSIS — Z794 Long term (current) use of insulin: Secondary | ICD-10-CM

## 2023-12-21 DIAGNOSIS — E119 Type 2 diabetes mellitus without complications: Secondary | ICD-10-CM | POA: Diagnosis not present

## 2023-12-21 MED ORDER — TIZANIDINE HCL 4 MG PO TABS
4.0000 mg | ORAL_TABLET | Freq: Three times a day (TID) | ORAL | 2 refills | Status: DC | PRN
  Filled 2023-12-21: qty 60, 20d supply, fill #0

## 2023-12-21 MED ORDER — HUMALOG KWIKPEN 200 UNIT/ML ~~LOC~~ SOPN
60.0000 [IU] | PEN_INJECTOR | Freq: Three times a day (TID) | SUBCUTANEOUS | 0 refills | Status: DC
  Filled 2023-12-21: qty 60, fill #0
  Filled 2023-12-21: qty 60, 67d supply, fill #0

## 2023-12-21 NOTE — Progress Notes (Signed)
 Subjective   Patient ID: Jill Shaw, female    DOB: 1976-07-25, 48 y.o.   MRN: [id number]  Chief Complaint  Patient presents with   Hospitalization Follow-up    Patient needs Blood pressure cuff, scale and pads    Referring provider: Ivonne Andrew, NP  Jill Shaw is a 48 y.o. female with Past Medical History: No date: Abnormal uterine bleeding (AUB) No date: Arthritis     Comment:  knees, hands No date: Atypical chest pain     Comment:  cardiology--- dr Rosemary Holms did cardiac cath 08-30-2021               showed minimal luminal irregularity involving LAD all               other coronaries w/ normal  flow No date: Chronic diastolic (congestive) heart failure (HCC)     Comment:  cardiologist---- dr Rosemary Holms;  preserved ef No date: Chronic iron deficiency anemia No date: Chronic pain syndrome     Comment:  followed by pain management--- dr Carlis Abbott No date: CKD (chronic kidney disease), stage III (HCC) No date: Diabetic gastroparesis (HCC) No date: Diabetic peripheral neuropathy (HCC) No date: Fibromyalgia No date: GAD (generalized anxiety disorder) No date: Generalized abdominal pain No date: GERD (gastroesophageal reflux disease) 03/21/2023: History of acute renal failure     Comment:  admission in epic due to N/V/D due ot severe sepsis POA               due to UTI No date: History of chronic gastritis     Comment:  inflammatory No date: History of diabetic ketoacidosis     Comment:  multiple admission's last 3 in epic 06/ 2022;  01/ 2022;              09/ 2021 01/2016: History of seizure     Comment:  hypoglycemic seizure 12/2020: History of vertebral compression fracture     Comment:  T11 -- T12 & L1 No date: Hyperlipidemia No date: Hypertension     Comment:  followed by pcp No date: Insulin dependent type 2 diabetes mellitus (HCC)     Comment:  uncontrolled,  followed by pcp No date: Irritable bowel syndrome with constipation No date: MDD (major  depressive disorder) No date: Moderate COPD (chronic obstructive pulmonary disease) (HCC)     Comment:  pulmology--- dr Tonia Brooms No date: Moderate persistent asthma No date: Sickle cell trait (HCC) 10/2019: Vitamin D deficiency No date: Wears glasses   HPI  Patient presents today for a hospital follow-up.  She was seen in the ED for pain management.  We discussed that we have placed a referral at last visit for her to pain management.  We discussed that she does need to count again for pain management to set up the appointment.  We also placed a referral last visit for her to endocrinology.  We will try an appointment set up for her today.  Patient also needs a referral to the hypertension clinic.  Her blood pressure continues to be elevated although she is on several blood pressure medicines.   Discussed with patient that we can not mange her diabetes - she will need to be seen by endocrinology.       Allergies  Allergen Reactions   Elavil [Amitriptyline] Other (See Comments)    Coma   Orudis [Ketoprofen] Nausea And Vomiting   Desyrel [Trazodone] Nausea And Vomiting   Tylenol [Acetaminophen] Nausea And Vomiting  Aspirin Nausea Only   Motrin [Ibuprofen] Nausea And Vomiting   Naprosyn [Naproxen] Nausea And Vomiting   Neurontin [Gabapentin] Nausea And Vomiting and Other (See Comments)    upset stomach   Sulfa Antibiotics Nausea And Vomiting   Ultram [Tramadol] Nausea And Vomiting and Other (See Comments)    stomach upset   Victoza [Liraglutide] Nausea And Vomiting   Zegerid [Omeprazole-Sodium Bicarbonate] Nausea And Vomiting    Immunization History  Administered Date(s) Administered   Influenza, Seasonal, Injecte, Preservative Fre 07/30/2023   Influenza,inj,Quad PF,6+ Mos 09/03/2018, 07/03/2019, 07/04/2020, 07/01/2021   PNEUMOCOCCAL CONJUGATE-20 07/30/2023   Pneumococcal Polysaccharide-23 03/28/2015, 07/04/2020   Tdap 03/27/2016   Unspecified SARS-COV-2 Vaccination  03/25/2020, 05/05/2020    Tobacco History: Social History   Tobacco Use  Smoking Status Some Days   Current packs/day: 0.00   Average packs/day: 0.3 packs/day for 26.0 years (6.5 ttl pk-yrs)   Types: Cigarettes   Start date: 09/13/1994   Last attempt to quit: 09/13/2020   Years since quitting: 3.2  Smokeless Tobacco Never  Tobacco Comments   4-5 cigarettes/day   Pt smokes 1/2 ppd. AB. CMA 09-10-23   Ready to quit: Yes Counseling given: Yes Tobacco comments: 4-5 cigarettes/day Pt smokes 1/2 ppd. AB. CMA 09-10-23   Outpatient Encounter Medications as of 12/21/2023  Medication Sig   Accu-Chek Softclix Lancets lancets USE TO TEST AS DIRECTED UP TO FOUR TIMES DAILY.   albuterol (VENTOLIN HFA) 108 (90 Base) MCG/ACT inhaler Inhale 2 puffs into the lungs every 6 (six) hours as needed for wheezing or shortness of breath.   B-D ULTRAFINE III SHORT PEN 31G X 8 MM MISC USE AS DIRECTED EVERY MORNING, NOON, EVENING, AND AT BEDTIME   budesonide-formoterol (SYMBICORT) 160-4.5 MCG/ACT inhaler Inhale 2 puffs into the lungs 2 (two) times daily.   Continuous Blood Gluc Sensor (FREESTYLE LIBRE 3 SENSOR) MISC Place 1 sensor on the skin every 14 days. Use to check glucose continuously   Dexlansoprazole 30 MG capsule DR TAKE 1 CAPSULE (30 MG TOTAL) BY MOUTH DAILY. NEEDS OFFICE VISIT FOR ADDITIONAL REFILLS (Patient taking differently: Take 30 mg by mouth daily as needed (for acid reflux). NEEDS OFFICE VISIT FOR ADDITIONAL REFILLS)   diclofenac Sodium (VOLTAREN) 1 % GEL Apply 2 to 4 gram onto painful sites up to 4 times daily if needed, max daily dose: 32 Gram   dicyclomine (BENTYL) 20 MG tablet Take 1 tablet (20 mg total) by mouth 2 (two) times daily.   empagliflozin (JARDIANCE) 10 MG TABS tablet Take 1 tablet (10 mg total) by mouth daily.   furosemide (LASIX) 40 MG tablet Take 1 tablet (40 mg total) by mouth daily.   glucose blood (ACCU-CHEK GUIDE) test strip USE TO TEST BLOOD GLUCOSE AS DIRECTED UP TO 4  TIMES DAILY   hydrALAZINE (APRESOLINE) 25 MG tablet Take 1 tablet (25 mg total) by mouth 3 (three) times daily.   melatonin 3 MG TABS tablet Take 1 tablet (3 mg total) by mouth at bedtime.   metoCLOPramide (REGLAN) 10 MG tablet Take 1 tablet (10 mg total) by mouth every 8 (eight) hours as needed for nausea.   metoprolol succinate (TOPROL-XL) 100 MG 24 hr tablet Take 1 tablet (100 mg total) by mouth daily. Take with or immediately following a meal.   naloxone (NARCAN) nasal spray 4 mg/0.1 mL Insert 1 spray into one nostril if poorly responding / turning blue. CALL 911 ASAP   nicotine (NICODERM CQ - DOSED IN MG/24 HOURS) 21 mg/24hr patch Place  1 patch (21 mg total) onto the skin daily.   nitroGLYCERIN (NITROSTAT) 0.4 MG SL tablet Place 1 tablet (0.4 mg total) under the tongue every 5 (five) minutes as needed for chest pain. Additional refills to be filled by PCP, patient aware   norethindrone (AYGESTIN) 5 MG tablet Take 1 tablet (5 mg total) by mouth 2 (two) times daily.   ondansetron (ZOFRAN-ODT) 4 MG disintegrating tablet Take 1 tablet (4 mg total) by mouth every 8 (eight) hours as needed for nausea or vomiting.   Oxycodone HCl 10 MG TABS Take 1 tablet (10 mg total) by mouth in the morning, at noon, in the evening, and at bedtime if needed for pain   pantoprazole (PROTONIX) 20 MG tablet Take 1 tablet (20 mg total) by mouth daily.   Pimozide 1 MG TABS Take 1 mg by mouth daily as needed (feeling of facial itching).   polyethylene glycol (MIRALAX) 17 g packet Take 17 g by mouth daily as needed.   potassium chloride SA (KLOR-CON M) 20 MEQ tablet Take 1 tablet (20 mEq total) by mouth daily.   pregabalin (LYRICA) 75 MG capsule Take 1 (one) capsule by mouth two times daily, Decrease to bedtime only if feel off balance or overmedicated.   rosuvastatin (CRESTOR) 20 MG tablet Take 1 tablet (20 mg total) by mouth daily.   spironolactone (ALDACTONE) 25 MG tablet Take 1 tablet (25 mg total) by mouth daily.    sucralfate (CARAFATE) 1 g tablet Take 1 tablet (1 g total) by mouth 4 (four) times daily -  with meals and at bedtime.   topiramate (TOPAMAX) 25 MG tablet Take 25 mg by mouth at bedtime.   TOUJEO MAX SOLOSTAR 300 UNIT/ML Solostar Pen Inject 40 Units into the skin in the morning.   TOUJEO MAX SOLOSTAR 300 UNIT/ML Solostar Pen Inject 60 Units into the skin in the morning.   [DISCONTINUED] HUMALOG KWIKPEN 200 UNIT/ML KwikPen Inject 50 Units into the skin 3 (three) times daily before meals.   [DISCONTINUED] tiZANidine (ZANAFLEX) 4 MG tablet Take 1 tablet (4 mg total) by mouth every 6 (six) hours as needed for muscle spasms.   diclofenac Sodium (VOLTAREN) 1 % GEL Apply topically every 4 (four) hours as needed. (Patient not taking: Reported on 12/04/2023)   diclofenac Sodium (VOLTAREN) 1 % GEL Apply 2-4 g topically 4 (four) times daily as needed TO PAINFUL SITES. MAX DAILY DOSE IS 32 GRAMS PER MD.   HUMALOG KWIKPEN 200 UNIT/ML KwikPen Inject 60 Units into the skin 3 (three) times daily before meals.   nicotine polacrilex (GOODSENSE NICOTINE) 2 MG gum Take 1 each (2 mg total) by mouth as needed for smoking cessation. (Patient not taking: Reported on 12/04/2023)   pregabalin (LYRICA) 50 MG capsule Take 1 capsule (50 mg total) by mouth at bedtime. (Patient not taking: Reported on 12/21/2023)   psyllium (METAMUCIL SMOOTH TEXTURE) 58.6 % powder Take 1 packet by mouth 3 (three) times daily. (Patient not taking: Reported on 12/21/2023)   senna-docusate (SENOKOT-S) 8.6-50 MG tablet Take 1 tablet by mouth daily. (Patient not taking: Reported on 12/21/2023)   tiZANidine (ZANAFLEX) 4 MG tablet Take 1 tablet (4 mg total) by mouth every 8 (eight) hours as needed for muscle spasms.   tizanidine (ZANAFLEX) 6 MG capsule Take 6 mg by mouth 3 (three) times daily. (Patient not taking: Reported on 12/21/2023)   No facility-administered encounter medications on file as of 12/21/2023.    Review of Systems  Review of Systems  Constitutional: Negative.   HENT: Negative.    Cardiovascular: Negative.   Gastrointestinal: Negative.   Allergic/Immunologic: Negative.   Neurological: Negative.   Psychiatric/Behavioral: Negative.       Objective:   BP (!) 148/92   Pulse 92   Temp (!) 97.2 F (36.2 C) (Oral)   Wt 283 lb 9.6 oz (128.6 kg)   SpO2 97%   BMI 48.68 kg/m   Wt Readings from Last 5 Encounters:  12/21/23 283 lb 9.6 oz (128.6 kg)  12/12/23 270 lb (122.5 kg)  12/08/23 270 lb (122.5 kg)  12/01/23 270 lb (122.5 kg)  11/26/23 275 lb 12.8 oz (125.1 kg)     Physical Exam Vitals and nursing note reviewed.  Constitutional:      General: She is not in acute distress.    Appearance: She is well-developed.  Cardiovascular:     Rate and Rhythm: Normal rate and regular rhythm.  Pulmonary:     Effort: Pulmonary effort is normal.     Breath sounds: Normal breath sounds.  Neurological:     Mental Status: She is alert and oriented to person, place, and time.       Assessment & Plan:   Controlled type 2 diabetes mellitus without complication, with long-term current use of insulin (HCC) -     For home use only DME Other see comment -     AMB Referral [facility]  Type 2 diabetes mellitus without complication, with long-term current use of insulin (HCC) -     HumaLOG KwikPen; Inject 60 Units into the skin 3 (three) times daily before meals.  Dispense: 60 mL; Refill: 0  Uncontrolled hypertension -     Ambulatory referral to [facility]  Other orders -     tiZANidine HCl; Take 1 tablet (4 mg total) by mouth every 8 (eight) hours as needed for muscle spasms.  Dispense: 60 tablet; Refill: 2     Return in about 3 months (around 03/22/2024).   Ivonne Andrew, NP 12/21/2023

## 2023-12-21 NOTE — Patient Instructions (Signed)
 1. Controlled type 2 diabetes mellitus without complication, with long-term current use of insulin (HCC) (Primary)  - For home use only DME Other see comment - AMB Referral [facility] Care Management  2. Type 2 diabetes mellitus without complication, with long-term current use of insulin (HCC)  - HUMALOG KWIKPEN 200 UNIT/ML KwikPen; Inject 60 Units into the skin 3 (three) times daily before meals.  Dispense: 60 mL; Refill: 0  3. Uncontrolled hypertension  - Ambulatory referral to [facility]

## 2023-12-24 ENCOUNTER — Other Ambulatory Visit: Payer: Self-pay

## 2023-12-24 ENCOUNTER — Other Ambulatory Visit: Payer: Self-pay | Admitting: Nurse Practitioner

## 2023-12-24 DIAGNOSIS — I10 Essential (primary) hypertension: Secondary | ICD-10-CM

## 2023-12-24 MED ORDER — FUROSEMIDE 40 MG PO TABS
40.0000 mg | ORAL_TABLET | Freq: Every day | ORAL | 0 refills | Status: DC
Start: 2023-12-24 — End: 2024-01-17
  Filled 2023-12-24: qty 30, 30d supply, fill #0

## 2023-12-25 ENCOUNTER — Telehealth: Payer: Self-pay | Admitting: Nurse Practitioner

## 2023-12-25 ENCOUNTER — Telehealth: Payer: Self-pay

## 2023-12-25 DIAGNOSIS — E1165 Type 2 diabetes mellitus with hyperglycemia: Secondary | ICD-10-CM

## 2023-12-25 NOTE — Progress Notes (Signed)
   12/25/2023  Patient ID: Jill Shaw, female   DOB: 1975-10-28, 48 y.o.   MRN: [id number]  Attempted to contact patient for medication management/review. Left HIPAA compliant message for patient to return my call at their convenience.   First attempt for patient outreach. Will follow up with patient in 3-5 business days.  Thank you for allowing pharmacy to be a part of this patient's care.  Cephus Shelling, PharmD Clinical Pharmacist Cell: [phone]

## 2023-12-25 NOTE — Telephone Encounter (Signed)
 Copied from CRM [id number]. Topic: Referral - Status >> Dec 25, 2023 11:19 AM Turkey B wrote: Reason for CRM: pt called in states referral for pain, doesnt take her insurance. Please cb for further assistance

## 2023-12-26 ENCOUNTER — Other Ambulatory Visit: Payer: Self-pay

## 2023-12-27 ENCOUNTER — Other Ambulatory Visit: Payer: Self-pay

## 2023-12-27 ENCOUNTER — Telehealth: Payer: Self-pay | Admitting: Nurse Practitioner

## 2023-12-27 NOTE — Telephone Encounter (Signed)
 Reason for CRM: Patient asking for medical records to be faxed to [facility] [phone]

## 2023-12-28 ENCOUNTER — Telehealth: Payer: Self-pay

## 2023-12-28 DIAGNOSIS — Z79899 Other long term (current) drug therapy: Secondary | ICD-10-CM

## 2023-12-28 NOTE — Progress Notes (Signed)
   12/28/2023  Patient ID: Jill Shaw, female   DOB: 07-17-1976, 48 y.o.   MRN: [id number]  Attempted to contact patient for medication management/review. Left HIPAA compliant message for patient to return my call at their convenience.   Second attempt for patient outreach. Will follow up with patient in 7-10 business days.  Thank you for allowing pharmacy to be a part of this patient's care.  Cephus Shelling, PharmD Clinical Pharmacist Cell: [phone]

## 2023-12-31 ENCOUNTER — Other Ambulatory Visit: Payer: Self-pay

## 2023-12-31 ENCOUNTER — Telehealth: Payer: Self-pay | Admitting: Nurse Practitioner

## 2023-12-31 ENCOUNTER — Telehealth: Payer: Self-pay

## 2023-12-31 ENCOUNTER — Other Ambulatory Visit: Payer: Self-pay | Admitting: Obstetrics and Gynecology

## 2023-12-31 ENCOUNTER — Other Ambulatory Visit (HOSPITAL_COMMUNITY): Payer: Self-pay

## 2023-12-31 DIAGNOSIS — N939 Abnormal uterine and vaginal bleeding, unspecified: Secondary | ICD-10-CM

## 2023-12-31 DIAGNOSIS — M25531 Pain in right wrist: Secondary | ICD-10-CM | POA: Diagnosis not present

## 2023-12-31 NOTE — Telephone Encounter (Signed)
 Reason for CRM: Patient would like to know if Dr Tanda Rockers could refer her to this pain management location and send her information over to the office of...this was discussed in an appt about finding her a pain Dr and she found this one and would like to know if she could be referred [facility] [address] [place] [address] [phone] or [phone]

## 2023-12-31 NOTE — Telephone Encounter (Signed)
 Copied from CRM [id number]. Topic: General - Other >> Dec 31, 2023 12:00 PM Jill Shaw wrote: Reason for CRM: Patient had a question about the blood pressure cuff and weigh machine and pads this was discussed in an appt that the Dr would help find her a company to get these items delivered and she has not been contacted by a company concerning any of these items, and would like a call back [phone] on what her next steps should be  Please advise if you all got this referral for bp cuff and scale. Thank you.   Marland Kitchenkh

## 2024-01-01 ENCOUNTER — Other Ambulatory Visit: Payer: Self-pay

## 2024-01-01 ENCOUNTER — Telehealth: Payer: Self-pay

## 2024-01-01 NOTE — Telephone Encounter (Signed)
Called and sent my chart message. KH

## 2024-01-01 NOTE — Telephone Encounter (Signed)
 Called pt to advise that we do  not provide pt with scales. Pharmacy advised that pt check with her insurance company to see if they can help. KH

## 2024-01-02 ENCOUNTER — Other Ambulatory Visit: Payer: Self-pay

## 2024-01-03 ENCOUNTER — Other Ambulatory Visit: Payer: Self-pay

## 2024-01-08 ENCOUNTER — Encounter (HOSPITAL_COMMUNITY): Payer: Self-pay

## 2024-01-08 ENCOUNTER — Emergency Department (HOSPITAL_COMMUNITY)

## 2024-01-08 ENCOUNTER — Other Ambulatory Visit: Payer: Self-pay

## 2024-01-08 ENCOUNTER — Other Ambulatory Visit: Payer: Self-pay | Admitting: Nurse Practitioner

## 2024-01-08 ENCOUNTER — Emergency Department (HOSPITAL_COMMUNITY)
Admission: EM | Admit: 2024-01-08 | Discharge: 2024-01-09 | Discharge status: Against Medical Advice | Attending: Emergency Medicine | Admitting: Emergency Medicine

## 2024-01-08 DIAGNOSIS — M79605 Pain in left leg: Secondary | ICD-10-CM | POA: Insufficient documentation

## 2024-01-08 DIAGNOSIS — R109 Unspecified abdominal pain: Secondary | ICD-10-CM | POA: Insufficient documentation

## 2024-01-08 DIAGNOSIS — G8929 Other chronic pain: Secondary | ICD-10-CM | POA: Diagnosis present

## 2024-01-08 DIAGNOSIS — R079 Chest pain, unspecified: Secondary | ICD-10-CM | POA: Diagnosis not present

## 2024-01-08 DIAGNOSIS — M549 Dorsalgia, unspecified: Secondary | ICD-10-CM | POA: Insufficient documentation

## 2024-01-08 DIAGNOSIS — Z5321 Procedure and treatment not carried out due to patient leaving prior to being seen by health care provider: Secondary | ICD-10-CM | POA: Insufficient documentation

## 2024-01-08 DIAGNOSIS — M79604 Pain in right leg: Secondary | ICD-10-CM | POA: Diagnosis not present

## 2024-01-08 MED ORDER — MELATONIN 3 MG PO TABS
3.0000 mg | ORAL_TABLET | Freq: Every day | ORAL | 0 refills | Status: DC
  Filled 2024-01-08: qty 60, 60d supply, fill #0

## 2024-01-08 NOTE — Telephone Encounter (Signed)
 Copied from CRM [id number]. Topic: Clinical - Medication Refill >> Jan 08, 2024  8:14 AM Dimitri Ped wrote: Most Recent Primary Care Visit:  Provider: Ivonne Andrew  Department: [facility]  Visit Type: HOSPITAL FOLLOW UP  Date: 12/21/2023  Medication: melatonin 3 MG TABS tablet   Has the patient contacted their pharmacy? No patient is requesting a increase in dosage  (Agent: If no, request that the patient contact the pharmacy for the refill. If patient does not wish to contact the pharmacy document the reason why and proceed with request.) (Agent: If yes, when and what did the pharmacy advise?)  Is this the correct pharmacy for this prescription? Yes If no, delete pharmacy and type the correct one.  This is the patient's preferred pharmacy:  [facility] - [facility] [address], [address] [place] [address] Phone: [phone] Fax: [phone]  [id number] [place], [place] - [address] AT [facility] OF [address] [place] [address] Phone: [phone] Fax: [phone]  [id number] - [place], [place] - [address] AT [facility] [address] [place] [address] Phone: [phone] Fax: [phone]  [facility] [address] [place] [address] Phone: [phone] Fax: [phone]  [facility] [address] [place] [address] Phone: [phone] Fax: [phone]  [facility][id number] [place], [place] - [address] AT [facility] OF [facility] & [address] [place] [address] Phone: [phone] Fax: [phone]   Has the prescription been filled recently? No  Is the patient out of the medication? Yes  Has the patient been seen for an appointment in the last year OR does the patient have  an upcoming appointment? Yes  Can we respond through MyChart? Yes  Agent: Please be advised that Rx refills may take up to 3 business days. We ask that you follow-up with your pharmacy.

## 2024-01-08 NOTE — H&P (Signed)
  Patient: Jill Shaw  PID: [id number]  DOB: [birth year]  SEX: Female   Patient referred by DDS for extraction teeth 1, 7, 10, 16, 17, 32.   CC:No pain. Getting partials from dentist.   Past Medical History:  Morbid Obesity, High Blood Pressure, Asthma, Smoker, Diabetes, Arthritis, Swollen ankles, Acid Reflux, Gastroparesis, Sickle Cell Trait, COPD, Fibromyalgia, CKD, Chronic Pain, chronic diastolic heart failure    Medications: Albuterol, Insulin, budesonide-formoterol, Dexlansoprazole, Voltaren, Dicyclomine, Diclofenac, Jardiance, Lasix, Humalog, Hydralazine, Melatonin, Metamucil, Metoclopramide, Metoprolol, Naloxone, Nicoderm, Nitroglycerin, Norethindrone, ondahsetron, Protonix, Polyeth Glycol, Potassium Chloride, pregabalin, Rosuvastatin, Spironolactone, stool softener, Sucralfate, Tizanidine, Toujeo insulin    Allergies:     Amitriptyline, Trazadone, Tylenol, Ketoprofen    Surgeries:   C Section, MRI, ablasion     Social History       Smoking: 1/2 ppd           Alcohol: Drug use:                             Exam: BMI46. Erupted 1, 16, 17, 32. #17 with deep caries. Gross decay residual roots # 7, 10.    No purulence, edema, fluctuance, trismus. Oral cancer screening negative. Pharynx clear. No lymphadenopathy.  Panorex: Erupted 1, 16, 17, 32. #17 with deep caries. Gross decay residual roots # 7, 10.   Assessment: ASA 3. Non-restorable  1, 16, 17, 32. Pt says dentist is going to crown 7 and 10 and make a partial for the upper that might connect to the upper wisdom teeth.               Plan:   2. Extraction Teeth # 1, 7, 10, 16, 17, 32,   Hospital Day surgery.                 Rx: n               Risks and complications explained. Questions answered.   Georgia Lopes, DMD

## 2024-01-08 NOTE — ED Notes (Signed)
 Pt refused blood without an IV being done

## 2024-01-08 NOTE — ED Notes (Signed)
 Labs not collected;need blood work, pt requesting IV

## 2024-01-08 NOTE — ED Triage Notes (Signed)
 Pt is coming in with complaints of her chronic pain that is feet, legs, and back as well as some complaints of abd pain and chest pain. She mentions she is specifically coming in for the Chronic pain that has become acutely worse. No other complaints at this time and she does mentions she took and oxycodone 4hrs ago.

## 2024-01-09 ENCOUNTER — Other Ambulatory Visit: Payer: Self-pay

## 2024-01-09 DIAGNOSIS — G8929 Other chronic pain: Secondary | ICD-10-CM | POA: Diagnosis not present

## 2024-01-09 MED ORDER — LIDOCAINE 5 % EX PTCH
1.0000 | MEDICATED_PATCH | CUTANEOUS | Status: DC
  Administered 2024-01-09: 1 via TRANSDERMAL
  Filled 2024-01-09: qty 1

## 2024-01-09 MED ORDER — OXYCODONE-ACETAMINOPHEN 5-325 MG PO TABS
2.0000 | ORAL_TABLET | Freq: Once | ORAL | Status: AC
  Administered 2024-01-09: 2 via ORAL
  Filled 2024-01-09: qty 2

## 2024-01-09 NOTE — ED Notes (Signed)
 Pt not in H20, no where to be found at this time. [place] MD notified.

## 2024-01-09 NOTE — ED Notes (Signed)
 Pt is refusing all lab work until IV is placed

## 2024-01-10 ENCOUNTER — Other Ambulatory Visit: Payer: Self-pay

## 2024-01-10 MED ORDER — PREGABALIN 75 MG PO CAPS
75.0000 mg | ORAL_CAPSULE | Freq: Two times a day (BID) | ORAL | 0 refills | Status: DC
  Filled 2024-01-10 – 2024-01-11 (×2): qty 60, 30d supply, fill #0

## 2024-01-10 MED ORDER — OXYCODONE HCL 10 MG PO TABS
10.0000 mg | ORAL_TABLET | Freq: Four times a day (QID) | ORAL | 0 refills | Status: DC
  Filled 2024-01-10 – 2024-01-11 (×3): qty 120, 30d supply, fill #0

## 2024-01-10 MED ORDER — DICLOFENAC SODIUM 1 % EX GEL
4.0000 g | Freq: Four times a day (QID) | CUTANEOUS | 0 refills | Status: DC
  Filled 2024-01-10: qty 100, 13d supply, fill #0

## 2024-01-11 ENCOUNTER — Encounter (HOSPITAL_COMMUNITY): Payer: Self-pay | Admitting: Oral Surgery

## 2024-01-11 ENCOUNTER — Other Ambulatory Visit: Payer: Self-pay

## 2024-01-11 NOTE — Progress Notes (Addendum)
 PCP - Angus Seller, NP Cardiologist - Dr Truett Mainland [facility] - Dr Sula Soda Pulmonology - Rubye Oaks, NP  Chest x-ray - 01/08/24 EKG - 01/08/24 Stress Test - 05/23/21 ECHO - 06/01/21 Cardiac Cath - 08/30/21  ICD Pacemaker/Loop - n/a  Sleep Study -  n/a  Diabetes Type 2, fasting 300  THE NIGHT BEFORE SURGERY, take 50% Toujeo Insulin dose.      THE MORNING OF SURGERY, take 50 % Toujeo Insulin dose.   Hold Jardiance 72 hours prior to procedure. (SDW Call).  Last dose was on 01/11/24.  Do not take Humalog Insulin unless your CBG is greater than 220 mg/dL.  If CBG > 220 mg/dL, you may take  of your sliding scale (correction) dose of insulin.  If your blood sugar is less than 70 mg/dL, you will need to treat for low blood sugar: Treat a low blood sugar (less than 70 mg/dL) with  cup of clear juice (cranberry or apple), 4 glucose tablets, OR glucose gel. Recheck blood sugar in 15 minutes after treatment (to make sure it is greater than 70 mg/dL). If your blood sugar is not greater than 70 mg/dL on recheck, call [phone] for further instructions.  Aspirin and Blood Thinner Instructions:  n/a  NPO   Anesthesia review: Yes  STOP now taking any Aspirin (unless otherwise instructed by your surgeon), Aleve, Naproxen, Ibuprofen, Motrin, Advil, Goody's, BC's, all herbal medications, fish oil, and all vitamins.   Coronavirus Screening Do you have any of the following symptoms:  Cough yes/no: No Fever (>100.48F)  yes/no: No Runny nose yes/no: No Sore throat yes/no: No Difficulty breathing/shortness of breath  yes/no: No  Have you traveled in the last 14 days and where? yes/no: No  Patient verbalized understanding of instructions that were given via phone.

## 2024-01-11 NOTE — Progress Notes (Signed)
 Anesthesia Chart Review: SAME DAY WORK-UP  Case: [id number] Date/Time: 01/14/24 0830   Procedure: DENTAL RESTORATION/EXTRACTIONS   Anesthesia type: General   Diagnosis: Non-restorable tooth [K08.89]   Pre-op diagnosis: NON RESTORABLE   [facility] 12 / [facility]   Surgeons: Ocie Doyne, DMD       DISCUSSION: Patient is a 48 year old female scheduled for the above procedure. ED visits 08/26/23 and 09/08/23 for dental pain.    History includes smoking, HTN, HLD, Sickle cell trait, DM2 (with peripheral neuropathy), COPD, asthma,chronic diastolic CHF, CKD (Stage III), GERD, IBS, anemia, neuropathy, seizure-like activity (01/2016, history of hypoglycemic seizure), chronic pain/fibromyalgia, endometrial ablation (10/19/2018), obesity.   Multiple ED visits (7-8 since 08/2023) and had admission 07/2023 for acute on chronic diastolic CHF in setting of uncontrolled HTN (SBP 200's) and acute on chronic pain after running out of oxycodone and trying to establish with a new pain management provider. ED visits have been for various reasons. Last presentation was on 01/09/24 for what sounds like acute on chronic pain. She refused labs unless they placed an IV. She ultimately left without being seen. She did have normal coronaries in 2022.    PAT phone RN still attempting reach patient for updates and provide preoperative teaching.   She is a same day work-up, so will need updated labs and anesthesia team evaluation on the day of procedure. As of 12/12/23 H/H 13.4/39.1, PLT 165, glucose 422, Cr 1.05, AST 9, ALT < 6, Lipase 18. BNP 61.5 on 12/01/23.     VS:  Wt Readings from Last 3 Encounters:  12/21/23 128.6 kg  12/12/23 122.5 kg  12/08/23 122.5 kg   BP Readings from Last 3 Encounters:  01/08/24 (!) 140/99  12/21/23 (!) 148/92  12/12/23 136/67   Pulse Readings from Last 3 Encounters:  01/08/24 92  12/21/23 92  12/12/23 (!) 59     PROVIDERS: Ivonne Andrew, NP is PCP  Truett Mainland, MD  is cardiologist. He recommended as needed follow-up at 04/20/23 visit. HTN remained uncontrolled but with regular PCP follow-up who could manage.   LABS: Most recent lab results in [facility] include: Lab Results  Component Value Date   WBC 8.0 12/12/2023   HGB 13.4 12/12/2023   HCT 39.1 12/12/2023   PLT 165 12/12/2023   GLUCOSE 422 (H) 12/12/2023   CHOL 153 12/09/2021   TRIG 144 12/09/2021   HDL 54 12/09/2021   LDLCALC 74 12/09/2021   ALT <5 12/12/2023   AST 9 (L) 12/12/2023   NA 139 12/12/2023   K 4.1 12/12/2023   CL 107 12/12/2023   CREATININE 1.05 (H) 12/12/2023   BUN 10 12/12/2023   CO2 22 12/12/2023   TSH 1.813 07/30/2023   INR 1.0 02/28/2022   HGBA1C 9.4 (A) 11/26/2023    IMAGES: CXR 01/08/24: FINDINGS: Cardiac shadow is stable. Lungs are well aerated bilaterally. Linear scarring is noted in the bases bilaterally. No sizable effusion is seen. No bony abnormality is noted. IMPRESSION: No acute abnormality seen.  CT Abd/pelvis 11/30/13: IMPRESSION: 1. Mild bilateral urothelial thickening involving the renal collecting systems and ureters, suspicious for urinary tract infection. 2. Stable malpositioned intrauterine device. 3. Minimal umbilical hernia containing fat.    EKG: 01/08/24: Normal sinus rhythm Low voltage QRS Septal infarct , age undetermined Abnormal ECG When compared with ECG of 01-Dec-2023 09:58, No significant change was found Confirmed by Dione Booze ([id number]) on 01/08/2024 11:10:46 PM   CV: Cardiac cath 08/30/2021: LM: Normal LAD: Minimal luminal  irregularities Lcx: Normal RCA: Normal Normal flow in all coronary beds   Normal LVEDP   Consider alternate cause for chest pain Recommend aggressive blood pressure control   Ambulatory cardiac telemetry 7 days (06/01/2021 - 06/08/2021): Predominant underlying rhythm was sinus.  Minimum heart rate 82 bpm, maximum heart rate 145 bpm, average heart rate 110 bpm.  PACs and PVCs were rare.  Ventricular  trigeminy was present.  No patient triggered events. No evidence of atrial fibrillation, VT, SVT, high degree AV block, or pauses >3 seconds.    Echocardiogram 06/01/2021:  Left ventricle cavity is normal in size. Mild concentric hypertrophy of  the left ventricle. Hyperdynamic LV systolic function >70%. Patient is  tachycardic throughout the day. Indeterminate diastolic filling pattern.  No significant valvular abnormality.  Normal right atrial pressure.    Lexiscan/midified Bruce Tetrofosmin stress test 05/23/2021: Lexiscan/modified Bruce nuclear stress test performed using 1-day protocol. SPECT images show very small sized, mild intensity, inferior apical myocardium with mild reversibility. Stress LVEF calculated 40-45%, although visually appears 50-55%. Low risk study.   Past Medical History:  Diagnosis Date   Abnormal uterine bleeding (AUB)    Arthritis    knees, hands   Atypical chest pain    cardiology--- dr Rosemary Holms did cardiac cath 08-30-2021 showed minimal luminal irregularity involving LAD all other coronaries w/ normal  flow   Chronic diastolic (congestive) heart failure [facility])    cardiologist---- dr Rosemary Holms;  preserved ef   Chronic iron deficiency anemia    Chronic pain syndrome    followed by pain management--- dr Carlis Abbott   CKD (chronic kidney disease), stage III (HCC)    Diabetic gastroparesis (HCC)    Diabetic peripheral neuropathy (HCC)    Fibromyalgia    GAD (generalized anxiety disorder)    Generalized abdominal pain    GERD (gastroesophageal reflux disease)    History of acute renal failure 03/21/2023   admission in epic due to N/V/D due ot severe sepsis POA due to UTI   History of chronic gastritis    inflammatory   History of diabetic ketoacidosis    multiple admission's last 3 in epic 06/ 2022;  01/ 2022;   09/ 2021   History of seizure 01/2016   hypoglycemic seizure   History of vertebral compression fracture 12/2020   T11 -- T12 & L1    Hyperlipidemia    Hypertension    followed by pcp   Insulin dependent type 2 diabetes mellitus (HCC)    uncontrolled,  followed by pcp   Irritable bowel syndrome with constipation    MDD (major depressive disorder)    Moderate COPD (chronic obstructive pulmonary disease) (HCC)    pulmology--- dr Tonia Brooms   Moderate persistent asthma    Sickle cell trait (HCC)    Vitamin D deficiency 10/2019   Wears glasses     Past Surgical History:  Procedure Laterality Date   CATARACT EXTRACTION W/ INTRAOCULAR LENS IMPLANT Left    CESAREAN SECTION  2001   for twins   DILATION AND CURETTAGE OF UTERUS N/A 08/20/2019   Procedure: DILATATION AND CURETTAGE;  Surgeon: Allie Bossier, MD;  [facility];  Service: Gynecology;  Laterality: N/A;   DILATION AND CURETTAGE OF UTERUS  05/01/2023   Procedure: DILATATION AND CURETTAGE;  Surgeon: Lorriane Shire, MD;  Location: [facility];  Service: Gynecology;;   ENDOMETRIAL ABLATION N/A 08/20/2019   Procedure: Maren Reamer Ablation;  Surgeon: Allie Bossier, MD;  [facility];  Service: Gynecology;  Laterality: N/A;   EYE SURGERY Bilateral    laser right and cataract removed left eye   HYSTEROSCOPY N/A 05/01/2023   Procedure: HYSTEROSCOPY;  Surgeon: Lorriane Shire, MD;  Location: [facility];  Service: Gynecology;  Laterality: N/A;   INTRAUTERINE DEVICE (IUD) INSERTION N/A 05/01/2023   Procedure: INTRAUTERINE DEVICE (IUD) INSERTION;  Surgeon: Lorriane Shire, MD;  Location: [facility];  Service: Gynecology;  Laterality: N/A;   LEFT HEART CATH AND CORONARY ANGIOGRAPHY N/A 08/30/2021   Procedure: LEFT HEART CATH AND CORONARY ANGIOGRAPHY;  Surgeon: Elder Negus, MD;  [facility];  Service: Cardiovascular;  Laterality: N/A;   RADIOLOGY WITH ANESTHESIA N/A 09/16/2019   Procedure: MRI WITH ANESTHESIA   L SPINE WITHOUT CONTRAST, T SPINE WITHOUT CONTRAST , CERVICAL WITHOUT CONTRAST;  Surgeon:  Radiologist, Medication, MD;  Location: [facility];  Service: Radiology;  Laterality: N/A;   TUBAL LIGATION     interval BTL   UPPER GI ENDOSCOPY  07/2017    MEDICATIONS: No current facility-administered medications for this encounter.    albuterol (VENTOLIN HFA) 108 (90 Base) MCG/ACT inhaler   Dexlansoprazole 30 MG capsule DR   dicyclomine (BENTYL) 20 MG tablet   empagliflozin (JARDIANCE) 10 MG TABS tablet   furosemide (LASIX) 40 MG tablet   hydrALAZINE (APRESOLINE) 25 MG tablet   insulin lispro protamine-lispro (HUMALOG 75/25 MIX) (75-25) 100 UNIT/ML SUSP injection   metoCLOPramide (REGLAN) 10 MG tablet   metoprolol succinate (TOPROL-XL) 100 MG 24 hr tablet   nitroGLYCERIN (NITROSTAT) 0.4 MG SL tablet   norethindrone (AYGESTIN) 5 MG tablet   ondansetron (ZOFRAN-ODT) 4 MG disintegrating tablet   Oxycodone HCl 10 MG TABS   Pimozide 1 MG TABS   potassium chloride SA (KLOR-CON M) 20 MEQ tablet   pregabalin (LYRICA) 75 MG capsule   rosuvastatin (CRESTOR) 20 MG tablet   spironolactone (ALDACTONE) 25 MG tablet   sucralfate (CARAFATE) 1 g tablet   tiZANidine (ZANAFLEX) 4 MG tablet   TOUJEO MAX SOLOSTAR 300 UNIT/ML Solostar Pen   Accu-Chek Softclix Lancets lancets   B-D ULTRAFINE III SHORT PEN 31G X 8 MM MISC   budesonide-formoterol (SYMBICORT) 160-4.5 MCG/ACT inhaler   Continuous Blood Gluc Sensor (FREESTYLE LIBRE 3 SENSOR) MISC   diclofenac Sodium (VOLTAREN) 1 % GEL   diclofenac Sodium (VOLTAREN) 1 % GEL   diclofenac Sodium (VOLTAREN) 1 % GEL   diclofenac Sodium (VOLTAREN) 1 % GEL   glucose blood (ACCU-CHEK GUIDE) test strip   HUMALOG KWIKPEN 200 UNIT/ML KwikPen   melatonin 3 MG TABS tablet   naloxone (NARCAN) nasal spray 4 mg/0.1 mL   nicotine (NICODERM CQ - DOSED IN MG/24 HOURS) 21 mg/24hr patch   nicotine polacrilex (GOODSENSE NICOTINE) 2 MG gum   pantoprazole (PROTONIX) 20 MG tablet   polyethylene glycol (MIRALAX) 17 g packet   pregabalin (LYRICA) 50 MG capsule   pregabalin  (LYRICA) 75 MG capsule   psyllium (METAMUCIL SMOOTH TEXTURE) 58.6 % powder   senna-docusate (SENOKOT-S) 8.6-50 MG tablet   TOUJEO MAX SOLOSTAR 300 UNIT/ML Solostar Pen    Shonna Chock, PA-C Surgical Short Stay/Anesthesiology [facility] Phone [phone] [facility] Phone [phone] 01/11/2024 12:36 PM

## 2024-01-14 ENCOUNTER — Ambulatory Visit (HOSPITAL_COMMUNITY)
Admission: RE | Admit: 2024-01-14 | Discharge: 2024-01-14 | Disposition: A | Discharge status: Home / Self Care | Attending: Oral Surgery | Admitting: Oral Surgery

## 2024-01-14 ENCOUNTER — Encounter (HOSPITAL_COMMUNITY): Payer: Self-pay | Admitting: Oral Surgery

## 2024-01-14 ENCOUNTER — Encounter (HOSPITAL_COMMUNITY)
Admission: RE | Disposition: A | Discharge status: Home / Self Care | Payer: Self-pay | Source: Home / Self Care | Attending: Oral Surgery

## 2024-01-14 ENCOUNTER — Other Ambulatory Visit: Payer: Self-pay

## 2024-01-14 ENCOUNTER — Ambulatory Visit (HOSPITAL_COMMUNITY): Payer: Self-pay | Admitting: Vascular Surgery

## 2024-01-14 ENCOUNTER — Ambulatory Visit (HOSPITAL_BASED_OUTPATIENT_CLINIC_OR_DEPARTMENT_OTHER): Payer: Self-pay | Admitting: Vascular Surgery

## 2024-01-14 DIAGNOSIS — J4489 Other specified chronic obstructive pulmonary disease: Secondary | ICD-10-CM | POA: Insufficient documentation

## 2024-01-14 DIAGNOSIS — E1142 Type 2 diabetes mellitus with diabetic polyneuropathy: Secondary | ICD-10-CM | POA: Diagnosis not present

## 2024-01-14 DIAGNOSIS — D573 Sickle-cell trait: Secondary | ICD-10-CM | POA: Insufficient documentation

## 2024-01-14 DIAGNOSIS — Z79899 Other long term (current) drug therapy: Secondary | ICD-10-CM | POA: Diagnosis not present

## 2024-01-14 DIAGNOSIS — E1122 Type 2 diabetes mellitus with diabetic chronic kidney disease: Secondary | ICD-10-CM | POA: Diagnosis not present

## 2024-01-14 DIAGNOSIS — N183 Chronic kidney disease, stage 3 unspecified: Secondary | ICD-10-CM | POA: Insufficient documentation

## 2024-01-14 DIAGNOSIS — Z6841 Body Mass Index (BMI) 40.0 and over, adult: Secondary | ICD-10-CM | POA: Diagnosis not present

## 2024-01-14 DIAGNOSIS — I110 Hypertensive heart disease with heart failure: Secondary | ICD-10-CM | POA: Diagnosis not present

## 2024-01-14 DIAGNOSIS — I509 Heart failure, unspecified: Secondary | ICD-10-CM | POA: Diagnosis not present

## 2024-01-14 DIAGNOSIS — I493 Ventricular premature depolarization: Secondary | ICD-10-CM | POA: Insufficient documentation

## 2024-01-14 DIAGNOSIS — F17210 Nicotine dependence, cigarettes, uncomplicated: Secondary | ICD-10-CM

## 2024-01-14 DIAGNOSIS — Z794 Long term (current) use of insulin: Secondary | ICD-10-CM | POA: Insufficient documentation

## 2024-01-14 DIAGNOSIS — K0889 Other specified disorders of teeth and supporting structures: Secondary | ICD-10-CM

## 2024-01-14 DIAGNOSIS — Z793 Long term (current) use of hormonal contraceptives: Secondary | ICD-10-CM | POA: Insufficient documentation

## 2024-01-14 DIAGNOSIS — E119 Type 2 diabetes mellitus without complications: Secondary | ICD-10-CM

## 2024-01-14 DIAGNOSIS — Z87891 Personal history of nicotine dependence: Secondary | ICD-10-CM | POA: Insufficient documentation

## 2024-01-14 DIAGNOSIS — I5032 Chronic diastolic (congestive) heart failure: Secondary | ICD-10-CM | POA: Diagnosis not present

## 2024-01-14 DIAGNOSIS — G894 Chronic pain syndrome: Secondary | ICD-10-CM | POA: Insufficient documentation

## 2024-01-14 DIAGNOSIS — I5033 Acute on chronic diastolic (congestive) heart failure: Secondary | ICD-10-CM | POA: Diagnosis not present

## 2024-01-14 DIAGNOSIS — K029 Dental caries, unspecified: Secondary | ICD-10-CM | POA: Diagnosis not present

## 2024-01-14 DIAGNOSIS — Z7951 Long term (current) use of inhaled steroids: Secondary | ICD-10-CM | POA: Diagnosis not present

## 2024-01-14 DIAGNOSIS — K429 Umbilical hernia without obstruction or gangrene: Secondary | ICD-10-CM | POA: Diagnosis not present

## 2024-01-14 DIAGNOSIS — I130 Hypertensive heart and chronic kidney disease with heart failure and stage 1 through stage 4 chronic kidney disease, or unspecified chronic kidney disease: Secondary | ICD-10-CM | POA: Diagnosis not present

## 2024-01-14 DIAGNOSIS — K0850 Unsatisfactory restoration of tooth, unspecified: Secondary | ICD-10-CM | POA: Diagnosis not present

## 2024-01-14 HISTORY — PX: TOOTH EXTRACTION: SHX859

## 2024-01-14 LAB — BASIC METABOLIC PANEL WITH GFR
Anion gap: 7 (ref 5–15)
BUN: 14 mg/dL (ref 6–20)
CO2: 25 mmol/L (ref 22–32)
Calcium: 9.4 mg/dL (ref 8.9–10.3)
Chloride: 109 mmol/L (ref 98–111)
Creatinine, Ser: 1.11 mg/dL — ABNORMAL HIGH (ref 0.44–1.00)
GFR, Estimated: >60 mL/min (ref >60)
Glucose, Bld: 47 mg/dL — ABNORMAL LOW (ref 70–99)
Potassium: 3.6 mmol/L (ref 3.5–5.1)
Sodium: 141 mmol/L (ref 135–145)

## 2024-01-14 LAB — CBC
HCT: 40.3 % (ref 36.0–46.0)
Hemoglobin: 13.3 g/dL (ref 12.0–15.0)
MCH: 27.7 pg (ref 26.0–34.0)
MCHC: 33 g/dL (ref 30.0–36.0)
MCV: 83.8 fL (ref 80.0–100.0)
Platelets: 217 10*3/uL (ref 150–400)
RBC: 4.81 MIL/uL (ref 3.87–5.11)
RDW: 15.6 % — ABNORMAL HIGH (ref 11.5–15.5)
WBC: 10.5 10*3/uL (ref 4.0–10.5)
nRBC: 0 % (ref 0.0–0.2)

## 2024-01-14 LAB — POCT PREGNANCY, URINE: Preg Test, Ur: NEGATIVE

## 2024-01-14 LAB — GLUCOSE, CAPILLARY
Glucose-Capillary: 60 mg/dL — ABNORMAL LOW (ref 70–99)
Glucose-Capillary: 105 mg/dL — ABNORMAL HIGH (ref 70–99)
Glucose-Capillary: 180 mg/dL — ABNORMAL HIGH (ref 70–99)
Glucose-Capillary: 254 mg/dL — ABNORMAL HIGH (ref 70–99)

## 2024-01-14 SURGERY — DENTAL RESTORATION/EXTRACTIONS
Anesthesia: General

## 2024-01-14 MED ORDER — ONDANSETRON HCL 4 MG/2ML IJ SOLN
INTRAMUSCULAR | Status: DC | PRN
  Administered 2024-01-14: 4 mg via INTRAVENOUS

## 2024-01-14 MED ORDER — DEXTROSE 50 % IV SOLN
INTRAVENOUS | Status: AC
Start: 2024-01-14 — End: 2024-01-14
  Administered 2024-01-14: 25 mL via INTRAVENOUS
  Filled 2024-01-14: qty 50

## 2024-01-14 MED ORDER — SUGAMMADEX SODIUM 200 MG/2ML IV SOLN
INTRAVENOUS | Status: DC | PRN
  Administered 2024-01-14: 400 mg via INTRAVENOUS

## 2024-01-14 MED ORDER — MIDAZOLAM HCL 2 MG/2ML IJ SOLN
INTRAMUSCULAR | Status: AC
  Filled 2024-01-14: qty 2

## 2024-01-14 MED ORDER — ROCURONIUM BROMIDE 10 MG/ML (PF) SYRINGE
PREFILLED_SYRINGE | INTRAVENOUS | Status: AC
  Filled 2024-01-14: qty 30

## 2024-01-14 MED ORDER — INSULIN ASPART 100 UNIT/ML IJ SOLN: 4.0000 [IU] | Freq: Once | INTRAMUSCULAR | Status: DC

## 2024-01-14 MED ORDER — OXYMETAZOLINE HCL 0.05 % NA SOLN
NASAL | Status: DC | PRN
  Administered 2024-01-14: 1 via NASAL

## 2024-01-14 MED ORDER — CHLORHEXIDINE GLUCONATE 0.12 % MT SOLN
15.0000 mL | Freq: Once | OROMUCOSAL | Status: AC
  Administered 2024-01-14: 15 mL via OROMUCOSAL
  Filled 2024-01-14: qty 15

## 2024-01-14 MED ORDER — FENTANYL CITRATE (PF) 250 MCG/5ML IJ SOLN
INTRAMUSCULAR | Status: DC | PRN
  Administered 2024-01-14 (×2): 50 ug via INTRAVENOUS

## 2024-01-14 MED ORDER — EPHEDRINE 5 MG/ML INJ
INTRAVENOUS | Status: AC
Start: 2024-01-14 — End: ?
  Filled 2024-01-14: qty 5

## 2024-01-14 MED ORDER — LACTATED RINGERS IV SOLN: INTRAVENOUS | Status: DC

## 2024-01-14 MED ORDER — PROPOFOL 10 MG/ML IV BOLUS
INTRAVENOUS | Status: AC
  Filled 2024-01-14: qty 20

## 2024-01-14 MED ORDER — OXYCODONE HCL 5 MG PO TABS: 5.0000 mg | ORAL_TABLET | ORAL | 0 refills | Status: AC | PRN

## 2024-01-14 MED ORDER — LIDOCAINE 2% (20 MG/ML) 5 ML SYRINGE
INTRAMUSCULAR | Status: DC | PRN
  Administered 2024-01-14: 60 mg via INTRAVENOUS

## 2024-01-14 MED ORDER — PROPOFOL 10 MG/ML IV BOLUS
INTRAVENOUS | Status: DC | PRN
  Administered 2024-01-14: 150 ug/kg/min via INTRAVENOUS
  Administered 2024-01-14: 200 mg via INTRAVENOUS

## 2024-01-14 MED ORDER — ONDANSETRON HCL 4 MG/2ML IJ SOLN
INTRAMUSCULAR | Status: AC
Start: 2024-01-14 — End: ?
  Filled 2024-01-14: qty 2

## 2024-01-14 MED ORDER — AMOXICILLIN 500 MG PO CAPS
500.0000 mg | ORAL_CAPSULE | Freq: Three times a day (TID) | ORAL | 0 refills | Status: DC
  Filled 2024-01-14: qty 21, 7d supply, fill #0

## 2024-01-14 MED ORDER — 0.9 % SODIUM CHLORIDE (POUR BTL) OPTIME
TOPICAL | Status: DC | PRN
  Administered 2024-01-14: 1000 mL

## 2024-01-14 MED ORDER — OXYCODONE HCL 5 MG PO TABS
5.0000 mg | ORAL_TABLET | ORAL | 0 refills | Status: AC | PRN
  Filled 2024-01-14 (×2): qty 18, 3d supply, fill #0

## 2024-01-14 MED ORDER — ORAL CARE MOUTH RINSE: 15.0000 mL | Freq: Once | OROMUCOSAL | Status: AC

## 2024-01-14 MED ORDER — SUCCINYLCHOLINE CHLORIDE 200 MG/10ML IV SOSY
PREFILLED_SYRINGE | INTRAVENOUS | Status: AC
  Filled 2024-01-14: qty 20

## 2024-01-14 MED ORDER — OXYCODONE HCL 5 MG/5ML PO SOLN: 5.0000 mg | Freq: Once | ORAL | Status: DC | PRN

## 2024-01-14 MED ORDER — FENTANYL CITRATE (PF) 250 MCG/5ML IJ SOLN
INTRAMUSCULAR | Status: AC
  Filled 2024-01-14: qty 5

## 2024-01-14 MED ORDER — PHENYLEPHRINE 80 MCG/ML (10ML) SYRINGE FOR IV PUSH (FOR BLOOD PRESSURE SUPPORT)
PREFILLED_SYRINGE | INTRAVENOUS | Status: AC
Start: 2024-01-14 — End: ?
  Filled 2024-01-14: qty 20

## 2024-01-14 MED ORDER — LIDOCAINE-EPINEPHRINE 2 %-1:100000 IJ SOLN
INTRAMUSCULAR | Status: DC | PRN
  Administered 2024-01-14: 15 mL via INTRADERMAL

## 2024-01-14 MED ORDER — LIDOCAINE-EPINEPHRINE 2 %-1:100000 IJ SOLN
INTRAMUSCULAR | Status: AC
  Filled 2024-01-14: qty 1

## 2024-01-14 MED ORDER — OXYCODONE HCL 5 MG PO TABS: 5.0000 mg | ORAL_TABLET | Freq: Once | ORAL | Status: DC | PRN

## 2024-01-14 MED ORDER — MIDAZOLAM HCL 2 MG/2ML IJ SOLN
INTRAMUSCULAR | Status: DC | PRN
  Administered 2024-01-14: 2 mg via INTRAVENOUS

## 2024-01-14 MED ORDER — FENTANYL CITRATE (PF) 100 MCG/2ML IJ SOLN
INTRAMUSCULAR | Status: AC
  Administered 2024-01-14: 25 ug via INTRAVENOUS
  Filled 2024-01-14: qty 2

## 2024-01-14 MED ORDER — DEXAMETHASONE SODIUM PHOSPHATE 10 MG/ML IJ SOLN
INTRAMUSCULAR | Status: DC | PRN
  Administered 2024-01-14: 10 mg via INTRAVENOUS

## 2024-01-14 MED ORDER — INSULIN ASPART 100 UNIT/ML IJ SOLN
0.0000 [IU] | INTRAMUSCULAR | Status: DC | PRN
Start: 2024-01-14 — End: 2024-01-16

## 2024-01-14 MED ORDER — ROCURONIUM BROMIDE 10 MG/ML (PF) SYRINGE
PREFILLED_SYRINGE | INTRAVENOUS | Status: DC | PRN
  Administered 2024-01-14: 50 mg via INTRAVENOUS

## 2024-01-14 MED ORDER — PHENYLEPHRINE HCL (PRESSORS) 10 MG/ML IV SOLN
INTRAVENOUS | Status: DC | PRN
  Administered 2024-01-14: 80 ug via INTRAVENOUS

## 2024-01-14 MED ORDER — FENTANYL CITRATE (PF) 100 MCG/2ML IJ SOLN
INTRAMUSCULAR | Status: AC
  Filled 2024-01-14: qty 2

## 2024-01-14 MED ORDER — SODIUM CHLORIDE 0.9 % IR SOLN
Status: DC | PRN
  Administered 2024-01-14: 1000 mL

## 2024-01-14 MED ORDER — SUCCINYLCHOLINE CHLORIDE 200 MG/10ML IV SOSY
PREFILLED_SYRINGE | INTRAVENOUS | Status: DC | PRN
  Administered 2024-01-14: 140 mg via INTRAVENOUS

## 2024-01-14 MED ORDER — DEXTROSE 50 % IV SOLN: 25.0000 mL | Freq: Once | INTRAVENOUS | Status: AC

## 2024-01-14 MED ORDER — CEFAZOLIN SODIUM-DEXTROSE 3-4 GM/150ML-% IV SOLN
3.0000 g | INTRAVENOUS | Status: AC
  Administered 2024-01-14: 3 g via INTRAVENOUS
  Filled 2024-01-14: qty 150

## 2024-01-14 MED ORDER — DEXAMETHASONE SODIUM PHOSPHATE 10 MG/ML IJ SOLN
INTRAMUSCULAR | Status: AC
  Filled 2024-01-14: qty 1

## 2024-01-14 MED ORDER — FENTANYL CITRATE (PF) 100 MCG/2ML IJ SOLN
25.0000 ug | INTRAMUSCULAR | Status: DC | PRN
  Administered 2024-01-14 (×2): 25 ug via INTRAVENOUS

## 2024-01-14 SURGICAL SUPPLY — 26 items
BAG COUNTER SPONGE SURGICOUNT (BAG) IMPLANT
BLADE SURG 15 STRL LF DISP TIS (BLADE) ×1 IMPLANT
BUR CROSS CUT FISSURE 1.6 (BURR) ×1 IMPLANT
BUR EGG ELITE 4.0 (BURR) IMPLANT
CANISTER SUCT 3000ML PPV (MISCELLANEOUS) ×1 IMPLANT
COVER SURGICAL LIGHT HANDLE (MISCELLANEOUS) ×1 IMPLANT
GAUZE PACKING FOLDED 2 STR (GAUZE/BANDAGES/DRESSINGS) ×1 IMPLANT
GLOVE BIO SURGEON STRL SZ8 (GLOVE) ×1 IMPLANT
GOWN STRL REUS W/ TWL LRG LVL3 (GOWN DISPOSABLE) ×1 IMPLANT
GOWN STRL REUS W/ TWL XL LVL3 (GOWN DISPOSABLE) ×1 IMPLANT
IV NS 1000ML BAXH (IV SOLUTION) ×1 IMPLANT
KIT BASIN OR (CUSTOM PROCEDURE TRAY) ×1 IMPLANT
KIT TURNOVER KIT B (KITS) ×1 IMPLANT
NDL HYPO 25GX1X1/2 BEV (NEEDLE) ×2 IMPLANT
NEEDLE HYPO 25GX1X1/2 BEV (NEEDLE) ×2 IMPLANT
NS IRRIG 1000ML POUR BTL (IV SOLUTION) ×1 IMPLANT
PAD ARMBOARD POSITIONER FOAM (MISCELLANEOUS) ×1 IMPLANT
SLEEVE IRRIGATION ELITE 7 (MISCELLANEOUS) ×1 IMPLANT
SPIKE FLUID TRANSFER (MISCELLANEOUS) IMPLANT
SUT CHROMIC 3 0 SH 27 (SUTURE) IMPLANT
SUT PLAIN 3 0 PS2 27 (SUTURE) IMPLANT
SYR BULB IRRIG 60ML STRL (SYRINGE) ×1 IMPLANT
SYR CONTROL 10ML LL (SYRINGE) ×1 IMPLANT
TRAY ENT MC OR (CUSTOM PROCEDURE TRAY) ×1 IMPLANT
TUBING IRRIGATION (MISCELLANEOUS) ×1 IMPLANT
YANKAUER SUCT BULB TIP NO VENT (SUCTIONS) ×1 IMPLANT

## 2024-01-14 NOTE — Transfer of Care (Signed)
 Immediate Anesthesia Transfer of Care Note  Patient: Jill Shaw  Procedure(s) Performed: DENTAL RESTORATION/EXTRACTIONS  Patient Location: PACU  Anesthesia Type:General  Level of Consciousness: awake, alert , and drowsy  Airway & Oxygen Therapy: Patient Spontanous Breathing and Patient connected to face mask  Post-op Assessment: Report given to RN and Post -op Vital signs reviewed and stable  Post vital signs: Reviewed and stable  Last Vitals:  Vitals Value Taken Time  BP    Temp    Pulse    Resp    SpO2      Last Pain:  Vitals:   01/14/24 0732  TempSrc:   PainSc: 9       Patients Stated Pain Goal: 5 (01/14/24 0732)  Complications: No notable events documented.

## 2024-01-14 NOTE — Interval H&P Note (Signed)
 Anesthesia H&P Update: History and Physical Exam reviewed; patient is OK for planned anesthetic and procedure. ? ?

## 2024-01-14 NOTE — OR Nursing (Signed)
 Throat pack out 1006

## 2024-01-14 NOTE — Anesthesia Postprocedure Evaluation (Addendum)
 Anesthesia Post Note  Patient: Jill Shaw  Procedure(s) Performed: DENTAL RESTORATION/EXTRACTIONS     Patient location during evaluation: PACU Anesthesia Type: General Level of consciousness: awake and alert Pain management: pain level controlled Vital Signs Assessment: post-procedure vital signs reviewed and stable Respiratory status: spontaneous breathing, nonlabored ventilation, respiratory function stable and patient connected to nasal cannula oxygen Cardiovascular status: blood pressure returned to baseline and stable Postop Assessment: no apparent nausea or vomiting Anesthetic complications: no  No notable events documented.  Last Vitals:  Vitals:   01/14/24 1115 01/14/24 1120  BP: (!) 168/85   Pulse: 98   Resp: 12   Temp:    SpO2: 97% 97%                 Shelton Silvas

## 2024-01-14 NOTE — Anesthesia Procedure Notes (Addendum)
 Procedure Name: Intubation Date/Time: 01/14/2024 9:45 AM  Performed by: Jimmey Ralph, CRNAPre-anesthesia Checklist: Patient identified, Emergency Drugs available, Suction available and Patient being monitored Patient Re-evaluated:Patient Re-evaluated prior to induction Oxygen Delivery Method: Circle System Utilized Preoxygenation: Pre-oxygenation with 100% oxygen Induction Type: IV induction Ventilation: Mask ventilation without difficulty Laryngoscope Size: Glidescope and 3 Grade View: Grade I Nasal Tubes: Nasal Rae, Nasal prep performed and Right Tube size: 6.5 mm Number of attempts: 1 Placement Confirmation: ETT inserted through vocal cords under direct vision, positive ETCO2 and breath sounds checked- equal and bilateral Tube secured with: Tape Dental Injury: Teeth and Oropharynx as per pre-operative assessment

## 2024-01-14 NOTE — Op Note (Unsigned)
 NAME: Jill Shaw, Jill Shaw MEDICAL RECORD NO: [id number] ACCOUNT NO: [id number] DATE OF BIRTH: [birth year] FACILITY[facility] LOCATION: [facility] PHYSICIAN: Georgia Lopes, DDS  Operative Report   DATE OF PROCEDURE: 01/14/2024  PREOPERATIVE DIAGNOSES:  Nonrestorable teeth numbers 1, 7, 10, 16, 17, and 32 secondary to dental caries.  Morbid obesity.  POSTOPERATIVE DIAGNOSES:  Nonrestorable teeth numbers 1, 7, 10, 16, 17, and 32 secondary to dental caries.   Morbid obesity.  PROCEDURE:  Extraction of teeth numbers 1, 7, 10, 16, 17, and 32.  SURGEON:  Georgia Lopes, DDS  ANESTHESIA:  General nasal intubation.  Dr. Hart Rochester attending.  DESCRIPTION OF PROCEDURE:  The patient was taken to the operating room and placed on the table in the supine position.  General anesthesia was administered.  A nasal endotracheal tube was placed and secured.  The eyes were protected and the patient was  draped for surgery.  A timeout was performed.  The posterior pharynx was suctioned and a throat pack was placed.  2% lidocaine with 1:100,000 epinephrine was infiltrated in an inferior alveolar block on the right and left side and in a buccal and palatal  infiltration in the maxilla around the teeth to be removed.  A total of 15 mL was utilized.  A bite block was placed on the right side of the mouth.  A 15 blade was used to make an incision around tooth numbers 17 and 16.  The periosteum was reflected.   The teeth were elevated.  Tooth #16 was removed with the dental forceps.  Tooth #17 was fractured upon attempted removal, necessitating removal of bone with a Stryker handpiece and fissure bur under irrigation to remove the residual root.  The sockets  were curetted, irrigated, and closed with 3-0 chromic.  Then the periosteum was elevated around tooth #10.  The tooth was elevated with a 301 elevator and removed from the mouth with the dental forceps.  The socket was curetted and irrigated.  No suture  was needed.  The  bite block was repositioned to the other side of the mouth.  A 15 blade was used to make an incision around teeth numbers 1 and 32 and around tooth #7.  The periosteum was reflected.  The teeth were elevated and removed from the mouth  with the dental forceps.  Suturing was not needed.  The sockets were curetted, irrigated, and then the oral cavity was irrigated and suctioned.  The throat pack was removed.  The patient was left in the care of anesthesia for extubation and transported  to recovery with planned discharge home through Day Surgery.  ESTIMATED BLOOD LOSS:  Minimal.  COMPLICATIONS:  None.  SPECIMENS:  None.  COUNTS:  Correct.   PUS D: 01/14/2024 10:20:35 am T: 01/14/2024 8:53:00 pm  JOB: [id number]/ [id number]

## 2024-01-14 NOTE — Anesthesia Preprocedure Evaluation (Addendum)
 Anesthesia Evaluation  Patient identified by MRN, date of birth, ID band Patient awake    Reviewed: Allergy & Precautions, NPO status , Patient's Chart, lab work & pertinent test results  Airway Mallampati: III  TM Distance: >3 FB Neck ROM: Full    Dental  (+) Teeth Intact, Dental Advisory Given   Pulmonary asthma , COPD, Current Smoker and Patient abstained from smoking.   breath sounds clear to auscultation       Cardiovascular hypertension, Pt. on medications +CHF   Rhythm:Regular Rate:Normal  Echo:  Left ventricle cavity is normal in size. Mild concentric hypertrophy of  the left ventricle. Hyperdynamic LV systolic function >70%. Patient is  tachycardic throughout the day. Indeterminate diastolic filling pattern.  No significant valvular abnormality.     Neuro/Psych  PSYCHIATRIC DISORDERS Anxiety Depression     Neuromuscular disease    GI/Hepatic Neg liver ROS,GERD  Medicated,,  Endo/Other  diabetes, Type 2, Oral Hypoglycemic Agents, Insulin Dependent    Renal/GU Renal disease     Musculoskeletal  (+) Arthritis ,  Fibromyalgia -  Abdominal   Peds  Hematology  (+) Blood dyscrasia, Sickle cell trait and anemia   Anesthesia Other Findings   Reproductive/Obstetrics                             Anesthesia Physical Anesthesia Plan  ASA: 3  Anesthesia Plan: General   Post-op Pain Management: Tylenol PO (pre-op)*   Induction: Intravenous  PONV Risk Score and Plan: 3 and Ondansetron, Dexamethasone and Midazolam  Airway Management Planned: Nasal ETT  Additional Equipment: None  Intra-op Plan:   Post-operative Plan: Extubation in OR  Informed Consent: I have reviewed the patients History and Physical, chart, labs and discussed the procedure including the risks, benefits and alternatives for the proposed anesthesia with the patient or authorized representative who has indicated  his/her understanding and acceptance.     Dental advisory given  Plan Discussed with: CRNA  Anesthesia Plan Comments:         Anesthesia Quick Evaluation

## 2024-01-14 NOTE — Op Note (Signed)
 01/14/2024  10:17 AM  PATIENT:  Jill Shaw  48 y.o. female  PRE-OPERATIVE DIAGNOSIS:  NON RESTORABLE TEETH # 1, 7, 10, 16, 17, 32 SECONDARY TO DENTAL CARIES, MORBID OBESITY  POST-OPERATIVE DIAGNOSIS:  SAME  PROCEDURE:  Procedure(s): EXTRACTION TEETH # 1, 7, 10, 16, 17, 32  SURGEON:  Surgeon(s): Ocie Doyne, DMD  ANESTHESIA:   local and general  EBL:  minimal  DRAINS: none   SPECIMEN:  No Specimen  COUNTS:  YES  PLAN OF CARE: Discharge to home after PACU  PATIENT DISPOSITION:  PACU - hemodynamically stable.   PROCEDURE DETAILS: Dictation # [id number]  Georgia Lopes, DMD 01/14/2024 10:17 AM

## 2024-01-14 NOTE — OR Nursing (Signed)
 Throat pack in [phone]

## 2024-01-14 NOTE — H&P (Signed)
 H&P documentation  -History and Physical Reviewed  -Patient has been re-examined  -No change in the plan of care  Jill Shaw

## 2024-01-15 ENCOUNTER — Encounter (HOSPITAL_COMMUNITY): Payer: Self-pay | Admitting: Oral Surgery

## 2024-01-17 ENCOUNTER — Other Ambulatory Visit: Payer: Self-pay

## 2024-01-17 ENCOUNTER — Other Ambulatory Visit: Payer: Self-pay | Admitting: Nurse Practitioner

## 2024-01-17 DIAGNOSIS — I10 Essential (primary) hypertension: Secondary | ICD-10-CM

## 2024-01-17 DIAGNOSIS — I5032 Chronic diastolic (congestive) heart failure: Secondary | ICD-10-CM

## 2024-01-18 ENCOUNTER — Other Ambulatory Visit: Payer: Self-pay

## 2024-01-18 ENCOUNTER — Other Ambulatory Visit: Payer: Self-pay | Admitting: Cardiology

## 2024-01-18 DIAGNOSIS — E782 Mixed hyperlipidemia: Secondary | ICD-10-CM

## 2024-01-18 MED ORDER — METOPROLOL SUCCINATE ER 100 MG PO TB24
100.0000 mg | ORAL_TABLET | Freq: Every day | ORAL | 0 refills | Status: DC
Start: 2024-01-18 — End: 2024-03-09
  Filled 2024-01-18 – 2024-01-29 (×2): qty 90, 90d supply, fill #0

## 2024-01-18 MED ORDER — HYDRALAZINE HCL 25 MG PO TABS
25.0000 mg | ORAL_TABLET | Freq: Three times a day (TID) | ORAL | 0 refills | Status: DC
  Filled 2024-01-18 – 2024-01-30 (×2): qty 90, 30d supply, fill #0

## 2024-01-18 MED ORDER — FUROSEMIDE 40 MG PO TABS
40.0000 mg | ORAL_TABLET | Freq: Every day | ORAL | 0 refills | Status: DC
Start: 2024-01-18 — End: 2024-03-09
  Filled 2024-01-18 – 2024-02-07 (×4): qty 90, 90d supply, fill #0

## 2024-01-18 MED ORDER — SPIRONOLACTONE 25 MG PO TABS
25.0000 mg | ORAL_TABLET | Freq: Every day | ORAL | 0 refills | Status: DC
  Filled 2024-01-18 – 2024-02-07 (×5): qty 90, 90d supply, fill #0

## 2024-01-18 NOTE — Telephone Encounter (Signed)
 Please advise [facility]

## 2024-01-21 ENCOUNTER — Telehealth: Payer: Self-pay

## 2024-01-21 ENCOUNTER — Other Ambulatory Visit: Payer: Self-pay

## 2024-01-21 NOTE — Telephone Encounter (Signed)
 Copied from CRM [id number]. Topic: General - Other >> Jan 21, 2024  3:22 PM Clayton Bibles wrote: Jill Shaw needs an order for a  Scale to weigh herself; Machine to check her blood pressure and pulse; Bed pads used for incontinent, Fingertip Pulse Oximeter She wants to see if Medicaid will cover these items. Please call her when they are ready or it NP Tanda Rockers cannot write prescriptions at [phone]. Thanks

## 2024-01-24 ENCOUNTER — Other Ambulatory Visit: Payer: Self-pay

## 2024-01-25 ENCOUNTER — Other Ambulatory Visit: Payer: Self-pay | Admitting: Obstetrics and Gynecology

## 2024-01-25 DIAGNOSIS — N939 Abnormal uterine and vaginal bleeding, unspecified: Secondary | ICD-10-CM

## 2024-01-28 ENCOUNTER — Telehealth: Payer: Self-pay | Admitting: *Deleted

## 2024-01-28 DIAGNOSIS — H5310 Unspecified subjective visual disturbances: Secondary | ICD-10-CM | POA: Diagnosis not present

## 2024-01-28 DIAGNOSIS — H40013 Open angle with borderline findings, low risk, bilateral: Secondary | ICD-10-CM | POA: Diagnosis not present

## 2024-01-28 DIAGNOSIS — H35071 Retinal telangiectasis, right eye: Secondary | ICD-10-CM | POA: Diagnosis not present

## 2024-01-28 DIAGNOSIS — H04123 Dry eye syndrome of bilateral lacrimal glands: Secondary | ICD-10-CM | POA: Diagnosis not present

## 2024-01-28 LAB — HM DIABETES EYE EXAM

## 2024-01-28 NOTE — Telephone Encounter (Addendum)
 Received a voice message from patient this afternoon stating she is trying to get a refill of her norethindrone. To [facility]. Per chart review last RX was sent in 01/25/24 with 6 refills.  I called Annamae and she was on another phone call with her pharmacy and I said I would call her back. Alejandra Hurst

## 2024-01-28 NOTE — Telephone Encounter (Signed)
 I called Jill Shaw back and left a message I was returning the phone call and if she didn't get everything cleared up with pharmacy and still needs us , to call us  back and leave a detailed message. Jill Shaw

## 2024-01-29 ENCOUNTER — Other Ambulatory Visit: Payer: Self-pay

## 2024-01-29 ENCOUNTER — Other Ambulatory Visit: Payer: Self-pay | Admitting: Nurse Practitioner

## 2024-01-29 DIAGNOSIS — E119 Type 2 diabetes mellitus without complications: Secondary | ICD-10-CM

## 2024-01-29 MED ORDER — HUMALOG KWIKPEN 200 UNIT/ML ~~LOC~~ SOPN
60.0000 [IU] | PEN_INJECTOR | Freq: Three times a day (TID) | SUBCUTANEOUS | 0 refills | Status: DC
  Filled 2024-01-29 (×2): qty 60, 67d supply, fill #0
  Filled ????-??-??: fill #0

## 2024-01-30 ENCOUNTER — Other Ambulatory Visit: Payer: Self-pay

## 2024-01-30 ENCOUNTER — Telehealth: Payer: Self-pay

## 2024-01-30 DIAGNOSIS — J42 Unspecified chronic bronchitis: Secondary | ICD-10-CM

## 2024-01-30 NOTE — Telephone Encounter (Signed)
 Copied from CRM [id number]. Topic: Clinical - Medication Question >> Jan 30, 2024  3:11 PM Crist Dominion wrote: Reason for CRM: Patient is requesting NP Tammy Parrett order her a nebulizer and nebulizer solution.  Routing to Tammy to authorize this before order is placed.

## 2024-01-31 ENCOUNTER — Other Ambulatory Visit: Payer: Self-pay

## 2024-01-31 ENCOUNTER — Telehealth: Payer: Self-pay | Admitting: *Deleted

## 2024-01-31 ENCOUNTER — Telehealth: Payer: Self-pay

## 2024-01-31 DIAGNOSIS — R0683 Snoring: Secondary | ICD-10-CM

## 2024-01-31 MED ORDER — ALBUTEROL SULFATE (2.5 MG/3ML) 0.083% IN NEBU
2.5000 mg | INHALATION_SOLUTION | Freq: Four times a day (QID) | RESPIRATORY_TRACT | 2 refills | Status: AC | PRN
  Filled 2024-01-31: qty 75, 7d supply, fill #0
  Filled 2024-03-09: qty 75, 7d supply, fill #1
  Filled 2024-07-17: qty 75, 7d supply, fill #2

## 2024-01-31 NOTE — Telephone Encounter (Signed)
 Copied from CRM [id number]. Topic: Appointments - Scheduling Inquiry for Clinic >> Jan 31, 2024  3:52 PM Hilton Lucky wrote: Reason for CRM: Patient is calling to request that her provider have her stay overnight in the hospital to have a sleep disorder test done, as she was unable to complete the test that was provided for her to do at home. Please advise patient.    Tammy, do you want to order in lab study for her since she was unable to complete her HST?

## 2024-01-31 NOTE — Telephone Encounter (Signed)
 That is fine to send order to DME for neb machine.  Order for albuterol neb sent to pharm.  Use albuterol inhaler or neb As needed  for wheezing/shortness of breath.   Please make sure she has a follow up ov with PFT

## 2024-01-31 NOTE — Telephone Encounter (Addendum)
 I called and spoke with the pt and notified of response from Tammy  She verbalized understanding  Order placed for neb machine  She will have to call back later to set up appt with PFT  Nothing further needed

## 2024-01-31 NOTE — Telephone Encounter (Signed)
 That is fine for In lab sleep study , will set up split night sleep study

## 2024-01-31 NOTE — Addendum Note (Signed)
 Addended by: Melondy Blanchard M on: 01/31/2024 02:58 PM   Modules accepted: Orders

## 2024-01-31 NOTE — Telephone Encounter (Signed)
 Patient called RN line with regarding what IUD was placed and how when she needs the IUD replaced.  01/31/24 @ 1152  Attempted to call patient regarding her question with her IUD in placed. Left VM that I am returning her call from yesterday and to contact our office at [phone].  Upon chart review, patient had Mirena IUD placed during surgery in 04/2023.  Moira Andrews, RN

## 2024-02-02 ENCOUNTER — Emergency Department (HOSPITAL_COMMUNITY)
Admission: EM | Admit: 2024-02-02 | Discharge: 2024-02-02 | Discharge status: Against Medical Advice | Source: Home / Self Care

## 2024-02-02 ENCOUNTER — Encounter (HOSPITAL_COMMUNITY): Payer: Self-pay | Admitting: Emergency Medicine

## 2024-02-02 ENCOUNTER — Emergency Department (HOSPITAL_COMMUNITY)

## 2024-02-02 DIAGNOSIS — J4540 Moderate persistent asthma, uncomplicated: Secondary | ICD-10-CM | POA: Diagnosis not present

## 2024-02-02 DIAGNOSIS — N1832 Chronic kidney disease, stage 3b: Secondary | ICD-10-CM | POA: Diagnosis not present

## 2024-02-02 DIAGNOSIS — I509 Heart failure, unspecified: Secondary | ICD-10-CM | POA: Insufficient documentation

## 2024-02-02 DIAGNOSIS — I5031 Acute diastolic (congestive) heart failure: Secondary | ICD-10-CM | POA: Diagnosis not present

## 2024-02-02 DIAGNOSIS — R918 Other nonspecific abnormal finding of lung field: Secondary | ICD-10-CM | POA: Diagnosis not present

## 2024-02-02 DIAGNOSIS — I5043 Acute on chronic combined systolic (congestive) and diastolic (congestive) heart failure: Secondary | ICD-10-CM | POA: Diagnosis not present

## 2024-02-02 DIAGNOSIS — N1831 Chronic kidney disease, stage 3a: Secondary | ICD-10-CM | POA: Diagnosis not present

## 2024-02-02 DIAGNOSIS — R079 Chest pain, unspecified: Secondary | ICD-10-CM | POA: Insufficient documentation

## 2024-02-02 DIAGNOSIS — R0602 Shortness of breath: Secondary | ICD-10-CM | POA: Insufficient documentation

## 2024-02-02 DIAGNOSIS — E1142 Type 2 diabetes mellitus with diabetic polyneuropathy: Secondary | ICD-10-CM | POA: Diagnosis not present

## 2024-02-02 DIAGNOSIS — I160 Hypertensive urgency: Secondary | ICD-10-CM | POA: Diagnosis not present

## 2024-02-02 DIAGNOSIS — I5033 Acute on chronic diastolic (congestive) heart failure: Secondary | ICD-10-CM | POA: Diagnosis not present

## 2024-02-02 DIAGNOSIS — M7989 Other specified soft tissue disorders: Secondary | ICD-10-CM | POA: Insufficient documentation

## 2024-02-02 DIAGNOSIS — E662 Morbid (severe) obesity with alveolar hypoventilation: Secondary | ICD-10-CM | POA: Diagnosis not present

## 2024-02-02 DIAGNOSIS — Z5321 Procedure and treatment not carried out due to patient leaving prior to being seen by health care provider: Secondary | ICD-10-CM | POA: Insufficient documentation

## 2024-02-02 DIAGNOSIS — Z6841 Body Mass Index (BMI) 40.0 and over, adult: Secondary | ICD-10-CM | POA: Diagnosis not present

## 2024-02-02 DIAGNOSIS — Z794 Long term (current) use of insulin: Secondary | ICD-10-CM | POA: Diagnosis not present

## 2024-02-02 DIAGNOSIS — E66813 Obesity, class 3: Secondary | ICD-10-CM | POA: Diagnosis not present

## 2024-02-02 DIAGNOSIS — E872 Acidosis, unspecified: Secondary | ICD-10-CM | POA: Diagnosis not present

## 2024-02-02 DIAGNOSIS — Z1152 Encounter for screening for COVID-19: Secondary | ICD-10-CM | POA: Diagnosis not present

## 2024-02-02 DIAGNOSIS — N179 Acute kidney failure, unspecified: Secondary | ICD-10-CM | POA: Diagnosis not present

## 2024-02-02 DIAGNOSIS — J4489 Other specified chronic obstructive pulmonary disease: Secondary | ICD-10-CM | POA: Diagnosis not present

## 2024-02-02 DIAGNOSIS — E1122 Type 2 diabetes mellitus with diabetic chronic kidney disease: Secondary | ICD-10-CM | POA: Diagnosis not present

## 2024-02-02 DIAGNOSIS — G4700 Insomnia, unspecified: Secondary | ICD-10-CM | POA: Diagnosis not present

## 2024-02-02 DIAGNOSIS — J449 Chronic obstructive pulmonary disease, unspecified: Secondary | ICD-10-CM | POA: Diagnosis not present

## 2024-02-02 DIAGNOSIS — I130 Hypertensive heart and chronic kidney disease with heart failure and stage 1 through stage 4 chronic kidney disease, or unspecified chronic kidney disease: Secondary | ICD-10-CM | POA: Diagnosis not present

## 2024-02-02 DIAGNOSIS — E1143 Type 2 diabetes mellitus with diabetic autonomic (poly)neuropathy: Secondary | ICD-10-CM | POA: Diagnosis not present

## 2024-02-02 LAB — CBC
HCT: 40.4 % (ref 36.0–46.0)
Hemoglobin: 13.7 g/dL (ref 12.0–15.0)
MCH: 29.2 pg (ref 26.0–34.0)
MCHC: 33.9 g/dL (ref 30.0–36.0)
MCV: 86.1 fL (ref 80.0–100.0)
Platelets: 221 10*3/uL (ref 150–400)
RBC: 4.69 MIL/uL (ref 3.87–5.11)
RDW: 16.1 % — ABNORMAL HIGH (ref 11.5–15.5)
WBC: 12.3 10*3/uL — ABNORMAL HIGH (ref 4.0–10.5)
nRBC: 0 % (ref 0.0–0.2)

## 2024-02-02 LAB — COMPREHENSIVE METABOLIC PANEL WITH GFR
ALT: 9 U/L (ref 0–44)
AST: 21 U/L (ref 15–41)
Albumin: 3.8 g/dL (ref 3.5–5.0)
Alkaline Phosphatase: 85 U/L (ref 38–126)
Anion gap: 11 (ref 5–15)
BUN: 15 mg/dL (ref 6–20)
CO2: 22 mmol/L (ref 22–32)
Calcium: 9.5 mg/dL (ref 8.9–10.3)
Chloride: 110 mmol/L (ref 98–111)
Creatinine, Ser: 1.38 mg/dL — ABNORMAL HIGH (ref 0.44–1.00)
GFR, Estimated: 48 mL/min — ABNORMAL LOW (ref >60)
Glucose, Bld: 63 mg/dL — ABNORMAL LOW (ref 70–99)
Potassium: 4.1 mmol/L (ref 3.5–5.1)
Sodium: 143 mmol/L (ref 135–145)
Total Bilirubin: 0.5 mg/dL (ref 0.0–1.2)
Total Protein: 6.7 g/dL (ref 6.5–8.1)

## 2024-02-02 LAB — BRAIN NATRIURETIC PEPTIDE: B Natriuretic Peptide: 255.7 pg/mL — ABNORMAL HIGH (ref 0.0–100.0)

## 2024-02-02 LAB — TROPONIN I (HIGH SENSITIVITY)
Troponin I (High Sensitivity): 5 ng/L (ref <18)
Troponin I (High Sensitivity): 6 ng/L

## 2024-02-02 MED ORDER — FENTANYL CITRATE PF 50 MCG/ML IJ SOSY
25.0000 ug | PREFILLED_SYRINGE | Freq: Once | INTRAMUSCULAR | Status: AC
  Administered 2024-02-02: 25 ug via INTRAVENOUS
  Filled 2024-02-02: qty 1

## 2024-02-02 NOTE — ED Provider Triage Note (Signed)
 Emergency Medicine Provider Triage Evaluation Note  Jill Shaw , a 48 y.o. female  was evaluated in triage.  Pt complains of leg swelling shortness of breath.  Patient reports a history of congestive heart failure and current on diuretics and denies any missed doses.  She states that she has noted some weight gain and lower extremity swelling.  Shortness of breath worsens with ambulation.  Dors is some mild central chest pain as well.  Review of Systems  Positive: As above Negative: As above  Physical Exam  BP (!) 172/92 (BP Location: Left Arm)   Pulse 81   Temp 97.8 F (36.6 C)   Resp (!) 21   SpO2 94%  Gen:   Awake, no distress  Resp:  Normal effort  MSK:   Moves extremities without difficulty  Other:  2+ pitting edema in the lower extremities.  Medical Decision Making  Medically screening exam initiated at 3:42 PM.  Appropriate orders placed.  Jill Shaw was informed that the remainder of the evaluation will be completed by another provider, this initial triage assessment does not replace that evaluation, and the importance of remaining in the ED until their evaluation is complete.     Lanina Larranaga A, PA-C 02/02/24 1542

## 2024-02-02 NOTE — ED Notes (Signed)
Pt called X2 to go to a room. Pt could not be found.

## 2024-02-02 NOTE — ED Notes (Signed)
Pt called X2 for vitals recheck. Pt could not be found.  

## 2024-02-02 NOTE — ED Notes (Signed)
 Pt states her chest and head still hurt, and her feet keep going numb. Pt requesting pain medication. Triage RN made aware.

## 2024-02-02 NOTE — ED Triage Notes (Signed)
 Pt here from home with c/o some sob and some chronic swelling to her feet also some slight chest pain

## 2024-02-02 NOTE — ED Notes (Signed)
 Pt not in triage or XRAY.

## 2024-02-03 ENCOUNTER — Other Ambulatory Visit: Payer: Self-pay

## 2024-02-03 ENCOUNTER — Encounter (HOSPITAL_COMMUNITY): Payer: Self-pay | Admitting: *Deleted

## 2024-02-03 ENCOUNTER — Inpatient Hospital Stay (HOSPITAL_COMMUNITY)
Admission: EM | Admit: 2024-02-03 | Discharge: 2024-02-07 | DRG: 291 | Disposition: A | Discharge status: Home / Self Care | Attending: Internal Medicine | Admitting: Internal Medicine

## 2024-02-03 DIAGNOSIS — E1143 Type 2 diabetes mellitus with diabetic autonomic (poly)neuropathy: Secondary | ICD-10-CM | POA: Diagnosis present

## 2024-02-03 DIAGNOSIS — N1832 Chronic kidney disease, stage 3b: Secondary | ICD-10-CM | POA: Diagnosis present

## 2024-02-03 DIAGNOSIS — E1122 Type 2 diabetes mellitus with diabetic chronic kidney disease: Secondary | ICD-10-CM | POA: Diagnosis present

## 2024-02-03 DIAGNOSIS — I130 Hypertensive heart and chronic kidney disease with heart failure and stage 1 through stage 4 chronic kidney disease, or unspecified chronic kidney disease: Principal | ICD-10-CM | POA: Diagnosis present

## 2024-02-03 DIAGNOSIS — Z794 Long term (current) use of insulin: Secondary | ICD-10-CM

## 2024-02-03 DIAGNOSIS — Z6841 Body Mass Index (BMI) 40.0 and over, adult: Secondary | ICD-10-CM

## 2024-02-03 DIAGNOSIS — E872 Acidosis, unspecified: Secondary | ICD-10-CM | POA: Diagnosis present

## 2024-02-03 DIAGNOSIS — N179 Acute kidney failure, unspecified: Secondary | ICD-10-CM | POA: Diagnosis present

## 2024-02-03 DIAGNOSIS — I509 Heart failure, unspecified: Secondary | ICD-10-CM

## 2024-02-03 DIAGNOSIS — F17210 Nicotine dependence, cigarettes, uncomplicated: Secondary | ICD-10-CM | POA: Diagnosis present

## 2024-02-03 DIAGNOSIS — Z886 Allergy status to analgesic agent status: Secondary | ICD-10-CM

## 2024-02-03 DIAGNOSIS — E1142 Type 2 diabetes mellitus with diabetic polyneuropathy: Secondary | ICD-10-CM | POA: Diagnosis present

## 2024-02-03 DIAGNOSIS — Z882 Allergy status to sulfonamides status: Secondary | ICD-10-CM

## 2024-02-03 DIAGNOSIS — K219 Gastro-esophageal reflux disease without esophagitis: Secondary | ICD-10-CM | POA: Diagnosis present

## 2024-02-03 DIAGNOSIS — I5033 Acute on chronic diastolic (congestive) heart failure: Secondary | ICD-10-CM | POA: Diagnosis present

## 2024-02-03 DIAGNOSIS — N1831 Chronic kidney disease, stage 3a: Secondary | ICD-10-CM | POA: Diagnosis present

## 2024-02-03 DIAGNOSIS — Z7951 Long term (current) use of inhaled steroids: Secondary | ICD-10-CM

## 2024-02-03 DIAGNOSIS — Z79899 Other long term (current) drug therapy: Secondary | ICD-10-CM

## 2024-02-03 DIAGNOSIS — J4540 Moderate persistent asthma, uncomplicated: Secondary | ICD-10-CM | POA: Diagnosis present

## 2024-02-03 DIAGNOSIS — E66813 Obesity, class 3: Secondary | ICD-10-CM | POA: Diagnosis present

## 2024-02-03 DIAGNOSIS — K3184 Gastroparesis: Secondary | ICD-10-CM | POA: Diagnosis present

## 2024-02-03 DIAGNOSIS — M4856XS Collapsed vertebra, not elsewhere classified, lumbar region, sequela of fracture: Secondary | ICD-10-CM | POA: Diagnosis present

## 2024-02-03 DIAGNOSIS — I160 Hypertensive urgency: Principal | ICD-10-CM

## 2024-02-03 DIAGNOSIS — J4489 Other specified chronic obstructive pulmonary disease: Secondary | ICD-10-CM | POA: Diagnosis present

## 2024-02-03 DIAGNOSIS — E119 Type 2 diabetes mellitus without complications: Secondary | ICD-10-CM

## 2024-02-03 DIAGNOSIS — E662 Morbid (severe) obesity with alveolar hypoventilation: Secondary | ICD-10-CM | POA: Diagnosis present

## 2024-02-03 DIAGNOSIS — I5043 Acute on chronic combined systolic (congestive) and diastolic (congestive) heart failure: Secondary | ICD-10-CM | POA: Diagnosis present

## 2024-02-03 DIAGNOSIS — Z803 Family history of malignant neoplasm of breast: Secondary | ICD-10-CM

## 2024-02-03 DIAGNOSIS — Z79891 Long term (current) use of opiate analgesic: Secondary | ICD-10-CM

## 2024-02-03 DIAGNOSIS — G894 Chronic pain syndrome: Secondary | ICD-10-CM | POA: Diagnosis present

## 2024-02-03 DIAGNOSIS — Z833 Family history of diabetes mellitus: Secondary | ICD-10-CM

## 2024-02-03 DIAGNOSIS — I5031 Acute diastolic (congestive) heart failure: Secondary | ICD-10-CM | POA: Diagnosis present

## 2024-02-03 DIAGNOSIS — J449 Chronic obstructive pulmonary disease, unspecified: Secondary | ICD-10-CM | POA: Diagnosis present

## 2024-02-03 DIAGNOSIS — Z7984 Long term (current) use of oral hypoglycemic drugs: Secondary | ICD-10-CM

## 2024-02-03 DIAGNOSIS — G4700 Insomnia, unspecified: Secondary | ICD-10-CM | POA: Diagnosis present

## 2024-02-03 DIAGNOSIS — M797 Fibromyalgia: Secondary | ICD-10-CM | POA: Diagnosis present

## 2024-02-03 DIAGNOSIS — Z885 Allergy status to narcotic agent status: Secondary | ICD-10-CM

## 2024-02-03 DIAGNOSIS — Z888 Allergy status to other drugs, medicaments and biological substances status: Secondary | ICD-10-CM

## 2024-02-03 DIAGNOSIS — I10 Essential (primary) hypertension: Secondary | ICD-10-CM | POA: Diagnosis present

## 2024-02-03 DIAGNOSIS — E782 Mixed hyperlipidemia: Secondary | ICD-10-CM | POA: Diagnosis present

## 2024-02-03 DIAGNOSIS — M170 Bilateral primary osteoarthritis of knee: Secondary | ICD-10-CM | POA: Diagnosis present

## 2024-02-03 DIAGNOSIS — D573 Sickle-cell trait: Secondary | ICD-10-CM | POA: Diagnosis present

## 2024-02-03 DIAGNOSIS — Z8249 Family history of ischemic heart disease and other diseases of the circulatory system: Secondary | ICD-10-CM

## 2024-02-03 DIAGNOSIS — Z1152 Encounter for screening for COVID-19: Secondary | ICD-10-CM

## 2024-02-03 DIAGNOSIS — Z800 Family history of malignant neoplasm of digestive organs: Secondary | ICD-10-CM

## 2024-02-03 LAB — RESP PANEL BY RT-PCR (RSV, FLU A&B, COVID)  RVPGX2
Influenza A by PCR: NEGATIVE
Influenza B by PCR: NEGATIVE
Resp Syncytial Virus by PCR: NEGATIVE
SARS Coronavirus 2 by RT PCR: NEGATIVE

## 2024-02-03 NOTE — ED Notes (Signed)
 Pt complaining about fluid on her chest and on her feet. Pt requesting to see an EDP as soon as possible.

## 2024-02-03 NOTE — ED Provider Triage Note (Signed)
 Emergency Medicine Provider Triage Evaluation Note  VIDHI DELELLIS , a 48 y.o. female  was evaluated in triage.  Pt complains of generalized bodyaches.  Patient was seen here yesterday for concerns of continued generalized bodyaches and fatigue.  Denies any recent sick contacts.  Had workup performed yesterday that was largely reassuring although slight elevation in BNP.  Patient states that she takes her Lasix  40 mg daily as prescribed but is concerned that she continues to gain some weight.  Review of Systems  Positive: As above Negative: As above  Physical Exam  BP (!) 171/88 (BP Location: Right Arm)   Pulse 81   Temp 98.4 F (36.9 C)   Resp 19   Ht 5\' 4"  (1.626 m)   Wt 122.5 kg   SpO2 99%   BMI 46.36 kg/m  Gen:   Awake, no distress   Resp:  Increased work of breathing, no wheezing, rales, rhonchi. MSK:   Moves extremities without difficulty  Other:  2+ lower extremity edema, no abnormal heart sounds  Medical Decision Making  Medically screening exam initiated at 5:45 PM.  Appropriate orders placed.  DYMON SUMMERHILL was informed that the remainder of the evaluation will be completed by another provider, this initial triage assessment does not replace that evaluation, and the importance of remaining in the ED until their evaluation is complete.     Araceli Coufal A, PA-C 02/03/24 1746

## 2024-02-03 NOTE — ED Triage Notes (Signed)
 The pt  is here for generalized body pain for 3-4 days she came in yesterday to be seen but reports that she had to leave before she was seen  body aches for 3-4 days all her body hurts and her bp is elevated

## 2024-02-04 ENCOUNTER — Other Ambulatory Visit: Payer: Self-pay

## 2024-02-04 ENCOUNTER — Emergency Department (HOSPITAL_COMMUNITY)

## 2024-02-04 ENCOUNTER — Observation Stay (HOSPITAL_COMMUNITY)

## 2024-02-04 DIAGNOSIS — I5033 Acute on chronic diastolic (congestive) heart failure: Secondary | ICD-10-CM | POA: Diagnosis not present

## 2024-02-04 DIAGNOSIS — I160 Hypertensive urgency: Secondary | ICD-10-CM | POA: Diagnosis not present

## 2024-02-04 DIAGNOSIS — E1122 Type 2 diabetes mellitus with diabetic chronic kidney disease: Secondary | ICD-10-CM | POA: Diagnosis present

## 2024-02-04 DIAGNOSIS — G4700 Insomnia, unspecified: Secondary | ICD-10-CM | POA: Diagnosis not present

## 2024-02-04 DIAGNOSIS — I5031 Acute diastolic (congestive) heart failure: Secondary | ICD-10-CM | POA: Diagnosis present

## 2024-02-04 DIAGNOSIS — R519 Headache, unspecified: Secondary | ICD-10-CM | POA: Diagnosis not present

## 2024-02-04 DIAGNOSIS — E782 Mixed hyperlipidemia: Secondary | ICD-10-CM | POA: Diagnosis present

## 2024-02-04 DIAGNOSIS — E662 Morbid (severe) obesity with alveolar hypoventilation: Secondary | ICD-10-CM | POA: Diagnosis present

## 2024-02-04 DIAGNOSIS — J4489 Other specified chronic obstructive pulmonary disease: Secondary | ICD-10-CM | POA: Diagnosis present

## 2024-02-04 DIAGNOSIS — D573 Sickle-cell trait: Secondary | ICD-10-CM | POA: Diagnosis present

## 2024-02-04 DIAGNOSIS — R140 Abdominal distension (gaseous): Secondary | ICD-10-CM | POA: Diagnosis not present

## 2024-02-04 DIAGNOSIS — I5043 Acute on chronic combined systolic (congestive) and diastolic (congestive) heart failure: Secondary | ICD-10-CM | POA: Diagnosis present

## 2024-02-04 DIAGNOSIS — E66813 Obesity, class 3: Secondary | ICD-10-CM | POA: Diagnosis present

## 2024-02-04 DIAGNOSIS — N179 Acute kidney failure, unspecified: Secondary | ICD-10-CM | POA: Diagnosis not present

## 2024-02-04 DIAGNOSIS — Z794 Long term (current) use of insulin: Secondary | ICD-10-CM | POA: Diagnosis not present

## 2024-02-04 DIAGNOSIS — Z1152 Encounter for screening for COVID-19: Secondary | ICD-10-CM | POA: Diagnosis not present

## 2024-02-04 DIAGNOSIS — Z6841 Body Mass Index (BMI) 40.0 and over, adult: Secondary | ICD-10-CM | POA: Diagnosis not present

## 2024-02-04 DIAGNOSIS — J449 Chronic obstructive pulmonary disease, unspecified: Secondary | ICD-10-CM | POA: Diagnosis not present

## 2024-02-04 DIAGNOSIS — I130 Hypertensive heart and chronic kidney disease with heart failure and stage 1 through stage 4 chronic kidney disease, or unspecified chronic kidney disease: Secondary | ICD-10-CM | POA: Diagnosis present

## 2024-02-04 DIAGNOSIS — N1831 Chronic kidney disease, stage 3a: Secondary | ICD-10-CM | POA: Diagnosis not present

## 2024-02-04 DIAGNOSIS — M797 Fibromyalgia: Secondary | ICD-10-CM | POA: Diagnosis present

## 2024-02-04 DIAGNOSIS — E1142 Type 2 diabetes mellitus with diabetic polyneuropathy: Secondary | ICD-10-CM | POA: Diagnosis present

## 2024-02-04 DIAGNOSIS — I517 Cardiomegaly: Secondary | ICD-10-CM | POA: Diagnosis not present

## 2024-02-04 DIAGNOSIS — J984 Other disorders of lung: Secondary | ICD-10-CM | POA: Diagnosis not present

## 2024-02-04 DIAGNOSIS — E1143 Type 2 diabetes mellitus with diabetic autonomic (poly)neuropathy: Secondary | ICD-10-CM | POA: Diagnosis present

## 2024-02-04 DIAGNOSIS — N1832 Chronic kidney disease, stage 3b: Secondary | ICD-10-CM | POA: Diagnosis present

## 2024-02-04 DIAGNOSIS — F17210 Nicotine dependence, cigarettes, uncomplicated: Secondary | ICD-10-CM | POA: Diagnosis present

## 2024-02-04 DIAGNOSIS — G894 Chronic pain syndrome: Secondary | ICD-10-CM | POA: Diagnosis present

## 2024-02-04 DIAGNOSIS — M170 Bilateral primary osteoarthritis of knee: Secondary | ICD-10-CM | POA: Diagnosis present

## 2024-02-04 DIAGNOSIS — J4540 Moderate persistent asthma, uncomplicated: Secondary | ICD-10-CM | POA: Diagnosis present

## 2024-02-04 DIAGNOSIS — K3184 Gastroparesis: Secondary | ICD-10-CM | POA: Diagnosis present

## 2024-02-04 DIAGNOSIS — E872 Acidosis, unspecified: Secondary | ICD-10-CM | POA: Diagnosis present

## 2024-02-04 LAB — URINALYSIS, ROUTINE W REFLEX MICROSCOPIC
Bilirubin Urine: NEGATIVE
Glucose, UA: 50 mg/dL — AB
Hgb urine dipstick: NEGATIVE
Ketones, ur: NEGATIVE mg/dL
Leukocytes,Ua: NEGATIVE
Nitrite: NEGATIVE
Protein, ur: NEGATIVE mg/dL
Specific Gravity, Urine: 1.011 (ref 1.005–1.030)
pH: 5 (ref 5.0–8.0)

## 2024-02-04 LAB — BASIC METABOLIC PANEL WITH GFR
Anion gap: 10 (ref 5–15)
BUN: 13 mg/dL (ref 6–20)
CO2: 20 mmol/L — ABNORMAL LOW (ref 22–32)
Calcium: 9.1 mg/dL (ref 8.9–10.3)
Chloride: 110 mmol/L (ref 98–111)
Creatinine, Ser: 1.75 mg/dL — ABNORMAL HIGH (ref 0.44–1.00)
GFR, Estimated: 36 mL/min — ABNORMAL LOW (ref >60)
Glucose, Bld: 203 mg/dL — ABNORMAL HIGH (ref 70–99)
Potassium: 4.6 mmol/L (ref 3.5–5.1)
Sodium: 140 mmol/L (ref 135–145)

## 2024-02-04 LAB — CBC WITH DIFFERENTIAL/PLATELET
Abs Immature Granulocytes: 0.04 10*3/uL (ref 0.00–0.07)
Basophils Absolute: 0.1 10*3/uL (ref 0.0–0.1)
Basophils Relative: 1 %
Eosinophils Absolute: 0.3 10*3/uL (ref 0.0–0.5)
Eosinophils Relative: 3 %
HCT: 41.1 % (ref 36.0–46.0)
Hemoglobin: 13.5 g/dL (ref 12.0–15.0)
Immature Granulocytes: 0 %
Lymphocytes Relative: 29 %
Lymphs Abs: 3.2 10*3/uL (ref 0.7–4.0)
MCH: 28.7 pg (ref 26.0–34.0)
MCHC: 32.8 g/dL (ref 30.0–36.0)
MCV: 87.4 fL (ref 80.0–100.0)
Monocytes Absolute: 0.8 10*3/uL (ref 0.1–1.0)
Monocytes Relative: 7 %
Neutro Abs: 6.6 10*3/uL (ref 1.7–7.7)
Neutrophils Relative %: 60 %
Platelets: 233 10*3/uL (ref 150–400)
RBC: 4.7 MIL/uL (ref 3.87–5.11)
RDW: 16.5 % — ABNORMAL HIGH (ref 11.5–15.5)
WBC: 11.1 10*3/uL — ABNORMAL HIGH (ref 4.0–10.5)
nRBC: 0 % (ref 0.0–0.2)

## 2024-02-04 LAB — ECHOCARDIOGRAM COMPLETE
AR max vel: 2.4 cm2
AV Peak grad: 8.4 mmHg
Ao pk vel: 1.45 m/s
Area-P 1/2: 2.87 cm2
Height: 64 in
MV VTI: 2.63 cm2
S' Lateral: 3.1 cm
Weight: 4321.02 [oz_av]

## 2024-02-04 LAB — CBG MONITORING, ED
Glucose-Capillary: 163 mg/dL — ABNORMAL HIGH (ref 70–99)
Glucose-Capillary: 160 mg/dL — ABNORMAL HIGH (ref 70–99)
Glucose-Capillary: 255 mg/dL — ABNORMAL HIGH (ref 70–99)
Glucose-Capillary: 179 mg/dL — ABNORMAL HIGH (ref 70–99)

## 2024-02-04 LAB — GLUCOSE, CAPILLARY: Glucose-Capillary: 220 mg/dL — ABNORMAL HIGH (ref 70–99)

## 2024-02-04 LAB — TROPONIN I (HIGH SENSITIVITY)
Troponin I (High Sensitivity): 9 ng/L (ref <18)
Troponin I (High Sensitivity): 7 ng/L (ref <18)

## 2024-02-04 LAB — CK: Total CK: 146 U/L (ref 38–234)

## 2024-02-04 LAB — BRAIN NATRIURETIC PEPTIDE: B Natriuretic Peptide: 178.4 pg/mL — ABNORMAL HIGH (ref 0.0–100.0)

## 2024-02-04 MED ORDER — NICOTINE 21 MG/24HR TD PT24
21.0000 mg | MEDICATED_PATCH | Freq: Every day | TRANSDERMAL | Status: DC
  Administered 2024-02-04 – 2024-02-06 (×4): 21 mg via TRANSDERMAL
  Filled 2024-02-04 (×4): qty 1

## 2024-02-04 MED ORDER — ALBUTEROL SULFATE (2.5 MG/3ML) 0.083% IN NEBU: 2.5000 mg | INHALATION_SOLUTION | Freq: Four times a day (QID) | RESPIRATORY_TRACT | Status: DC | PRN

## 2024-02-04 MED ORDER — MELATONIN 3 MG PO TABS
3.0000 mg | ORAL_TABLET | Freq: Every day | ORAL | Status: DC
  Administered 2024-02-04: 3 mg via ORAL
  Filled 2024-02-04: qty 1

## 2024-02-04 MED ORDER — INSULIN ASPART 100 UNIT/ML IJ SOLN
0.0000 [IU] | Freq: Every day | INTRAMUSCULAR | Status: DC
  Administered 2024-02-04: 2 [IU] via SUBCUTANEOUS
  Administered 2024-02-05: 3 [IU] via SUBCUTANEOUS
  Administered 2024-02-06: 2 [IU] via SUBCUTANEOUS

## 2024-02-04 MED ORDER — HYDRALAZINE HCL 50 MG PO TABS
50.0000 mg | ORAL_TABLET | Freq: Three times a day (TID) | ORAL | Status: DC
  Administered 2024-02-04 – 2024-02-07 (×9): 50 mg via ORAL
  Filled 2024-02-04 (×8): qty 1
  Filled 2024-02-04: qty 2

## 2024-02-04 MED ORDER — DIPHENHYDRAMINE HCL 50 MG/ML IJ SOLN
12.5000 mg | Freq: Once | INTRAMUSCULAR | Status: AC
  Administered 2024-02-04: 12.5 mg via INTRAVENOUS
  Filled 2024-02-04: qty 1

## 2024-02-04 MED ORDER — SUCRALFATE 1 G PO TABS
1.0000 g | ORAL_TABLET | Freq: Three times a day (TID) | ORAL | Status: DC
  Administered 2024-02-04 – 2024-02-07 (×13): 1 g via ORAL
  Filled 2024-02-04 (×13): qty 1

## 2024-02-04 MED ORDER — ROSUVASTATIN CALCIUM 20 MG PO TABS
20.0000 mg | ORAL_TABLET | Freq: Every day | ORAL | Status: DC
  Administered 2024-02-04 – 2024-02-07 (×4): 20 mg via ORAL
  Filled 2024-02-04 (×4): qty 1

## 2024-02-04 MED ORDER — IPRATROPIUM-ALBUTEROL 0.5-2.5 (3) MG/3ML IN SOLN: 3.0000 mL | Freq: Four times a day (QID) | RESPIRATORY_TRACT | Status: DC | PRN

## 2024-02-04 MED ORDER — OXYCODONE HCL 5 MG PO TABS
10.0000 mg | ORAL_TABLET | Freq: Four times a day (QID) | ORAL | Status: DC | PRN
  Administered 2024-02-04 – 2024-02-07 (×14): 10 mg via ORAL
  Filled 2024-02-04 (×14): qty 2

## 2024-02-04 MED ORDER — SODIUM CHLORIDE 0.9% FLUSH
3.0000 mL | Freq: Two times a day (BID) | INTRAVENOUS | Status: DC
  Administered 2024-02-04 – 2024-02-07 (×7): 3 mL via INTRAVENOUS

## 2024-02-04 MED ORDER — ACETAMINOPHEN 325 MG PO TABS: 650.0000 mg | ORAL_TABLET | Freq: Four times a day (QID) | ORAL | Status: DC | PRN

## 2024-02-04 MED ORDER — IOHEXOL 350 MG/ML SOLN
75.0000 mL | Freq: Once | INTRAVENOUS | Status: AC | PRN
  Administered 2024-02-04: 75 mL via INTRAVENOUS

## 2024-02-04 MED ORDER — HYDROXYZINE HCL 10 MG PO TABS
10.0000 mg | ORAL_TABLET | Freq: Once | ORAL | Status: AC
  Administered 2024-02-04: 10 mg via ORAL
  Filled 2024-02-04: qty 1

## 2024-02-04 MED ORDER — PANTOPRAZOLE SODIUM 20 MG PO TBEC
20.0000 mg | DELAYED_RELEASE_TABLET | Freq: Every day | ORAL | Status: DC
  Administered 2024-02-04 – 2024-02-07 (×4): 20 mg via ORAL
  Filled 2024-02-04 (×5): qty 1

## 2024-02-04 MED ORDER — SPIRONOLACTONE 25 MG PO TABS
25.0000 mg | ORAL_TABLET | Freq: Every day | ORAL | Status: DC
  Administered 2024-02-04: 25 mg via ORAL
  Filled 2024-02-04: qty 1

## 2024-02-04 MED ORDER — HYDRALAZINE HCL 25 MG PO TABS
25.0000 mg | ORAL_TABLET | Freq: Three times a day (TID) | ORAL | Status: DC
  Administered 2024-02-04: 25 mg via ORAL
  Filled 2024-02-04: qty 1

## 2024-02-04 MED ORDER — PREGABALIN 100 MG PO CAPS
100.0000 mg | ORAL_CAPSULE | Freq: Three times a day (TID) | ORAL | Status: DC
Start: 2024-02-04 — End: 2024-02-07
  Administered 2024-02-04 – 2024-02-07 (×10): 100 mg via ORAL
  Filled 2024-02-04 (×9): qty 1

## 2024-02-04 MED ORDER — ENOXAPARIN SODIUM 60 MG/0.6ML IJ SOSY
60.0000 mg | PREFILLED_SYRINGE | INTRAMUSCULAR | Status: DC
  Administered 2024-02-04 – 2024-02-07 (×4): 60 mg via SUBCUTANEOUS
  Filled 2024-02-04 (×4): qty 0.6

## 2024-02-04 MED ORDER — FLUTICASONE FUROATE-VILANTEROL 200-25 MCG/ACT IN AEPB
1.0000 | INHALATION_SPRAY | Freq: Every day | RESPIRATORY_TRACT | Status: DC
  Administered 2024-02-04 – 2024-02-07 (×2): 1 via RESPIRATORY_TRACT
  Filled 2024-02-04 (×2): qty 28

## 2024-02-04 MED ORDER — PREGABALIN 25 MG PO CAPS
75.0000 mg | ORAL_CAPSULE | Freq: Two times a day (BID) | ORAL | Status: DC
Start: 2024-02-04 — End: 2024-02-04

## 2024-02-04 MED ORDER — HYDRALAZINE HCL 20 MG/ML IJ SOLN
5.0000 mg | Freq: Once | INTRAMUSCULAR | Status: AC
  Administered 2024-02-04: 5 mg via INTRAVENOUS
  Filled 2024-02-04: qty 1

## 2024-02-04 MED ORDER — HYDROMORPHONE HCL 1 MG/ML IJ SOLN
1.0000 mg | Freq: Once | INTRAMUSCULAR | Status: AC
  Administered 2024-02-04: 1 mg via INTRAVENOUS
  Filled 2024-02-04: qty 1

## 2024-02-04 MED ORDER — METOPROLOL SUCCINATE ER 100 MG PO TB24
100.0000 mg | ORAL_TABLET | Freq: Every day | ORAL | Status: DC
  Administered 2024-02-04 – 2024-02-07 (×4): 100 mg via ORAL
  Filled 2024-02-04 (×3): qty 1
  Filled 2024-02-04: qty 4

## 2024-02-04 MED ORDER — DICYCLOMINE HCL 20 MG PO TABS
20.0000 mg | ORAL_TABLET | Freq: Two times a day (BID) | ORAL | Status: DC
Start: 2024-02-04 — End: 2024-02-07
  Administered 2024-02-04 – 2024-02-07 (×7): 20 mg via ORAL
  Filled 2024-02-04 (×8): qty 1

## 2024-02-04 MED ORDER — PREGABALIN 100 MG PO CAPS
100.0000 mg | ORAL_CAPSULE | Freq: Three times a day (TID) | ORAL | Status: DC
  Filled 2024-02-04: qty 1

## 2024-02-04 MED ORDER — FUROSEMIDE 10 MG/ML IJ SOLN
60.0000 mg | Freq: Once | INTRAMUSCULAR | Status: AC
  Administered 2024-02-04: 60 mg via INTRAVENOUS
  Filled 2024-02-04: qty 6

## 2024-02-04 MED ORDER — TIZANIDINE HCL 4 MG PO TABS
4.0000 mg | ORAL_TABLET | Freq: Three times a day (TID) | ORAL | Status: DC | PRN
  Administered 2024-02-04 – 2024-02-06 (×4): 4 mg via ORAL
  Filled 2024-02-04 (×4): qty 1

## 2024-02-04 MED ORDER — EMPAGLIFLOZIN 10 MG PO TABS
10.0000 mg | ORAL_TABLET | Freq: Every day | ORAL | Status: DC
  Administered 2024-02-04: 10 mg via ORAL
  Filled 2024-02-04: qty 1

## 2024-02-04 MED ORDER — INSULIN ASPART PROT & ASPART (70-30 MIX) 100 UNIT/ML ~~LOC~~ SUSP
45.0000 [IU] | Freq: Every day | SUBCUTANEOUS | Status: DC
  Administered 2024-02-04 – 2024-02-07 (×4): 45 [IU] via SUBCUTANEOUS
  Filled 2024-02-04 (×3): qty 10

## 2024-02-04 MED ORDER — INSULIN ASPART 100 UNIT/ML IJ SOLN
0.0000 [IU] | Freq: Three times a day (TID) | INTRAMUSCULAR | Status: DC
  Administered 2024-02-04: 8 [IU] via SUBCUTANEOUS
  Administered 2024-02-04 – 2024-02-05 (×3): 3 [IU] via SUBCUTANEOUS
  Administered 2024-02-05: 8 [IU] via SUBCUTANEOUS
  Administered 2024-02-05: 5 [IU] via SUBCUTANEOUS
  Administered 2024-02-06: 8 [IU] via SUBCUTANEOUS
  Administered 2024-02-06: 5 [IU] via SUBCUTANEOUS
  Administered 2024-02-06 – 2024-02-07 (×2): 8 [IU] via SUBCUTANEOUS
  Administered 2024-02-07: 11 [IU] via SUBCUTANEOUS

## 2024-02-04 MED ORDER — METOCLOPRAMIDE HCL 5 MG/ML IJ SOLN
10.0000 mg | Freq: Once | INTRAMUSCULAR | Status: AC
  Administered 2024-02-04: 10 mg via INTRAVENOUS
  Filled 2024-02-04: qty 2

## 2024-02-04 MED ORDER — OXYCODONE HCL 5 MG PO TABS
10.0000 mg | ORAL_TABLET | Freq: Once | ORAL | Status: AC
  Administered 2024-02-04: 10 mg via ORAL
  Filled 2024-02-04: qty 2

## 2024-02-04 MED ORDER — ACETAMINOPHEN 650 MG RE SUPP: 650.0000 mg | Freq: Four times a day (QID) | RECTAL | Status: DC | PRN

## 2024-02-04 MED ORDER — LOSARTAN POTASSIUM 25 MG PO TABS
25.0000 mg | ORAL_TABLET | Freq: Every day | ORAL | Status: DC
  Administered 2024-02-04: 25 mg via ORAL
  Filled 2024-02-04: qty 1

## 2024-02-04 MED ORDER — PERFLUTREN LIPID MICROSPHERE
1.0000 mL | INTRAVENOUS | Status: AC | PRN
  Administered 2024-02-04: 3 mL via INTRAVENOUS

## 2024-02-04 MED ORDER — METOCLOPRAMIDE HCL 5 MG PO TABS: 10.0000 mg | ORAL_TABLET | Freq: Three times a day (TID) | ORAL | Status: DC | PRN

## 2024-02-04 MED ORDER — FUROSEMIDE 10 MG/ML IJ SOLN: 60.0000 mg | Freq: Every day | INTRAMUSCULAR | Status: DC

## 2024-02-04 NOTE — Inpatient Diabetes Management (Signed)
 Inpatient Diabetes Program Recommendations  AACE/ADA: New Consensus Statement on Inpatient Glycemic Control (2015)  Target Ranges:  Prepandial:   less than 140 mg/dL      Peak postprandial:   less than 180 mg/dL (1-2 hours)      Critically ill patients:  140 - 180 mg/dL   Lab Results  Component Value Date   GLUCAP 179 (H) 02/04/2024   HGBA1C 9.4 (A) 11/26/2023    Review of Glycemic Control  Diabetes history: DM 2 Outpatient Diabetes medications: 75/25 60 units tid, Toujeo  60 units Daily, Jardiance  10 mg Daily (pt reports the reason its reported she didn't pick it up is because she had a supply at home, she has been taking every day) Current orders for Inpatient glycemic control:  Jardiance  10 mg Daily Novolog  0-15 units tid + hs Novolog  70/30 45 units Daily  Spoke with pt at bedside. Pt reports seeing her PCP regularly and last visit was referred to Endocrinology  and has been that way since March but does not know who the referral was sent to. I suggested she call the office and see which Endo office she was referred to and for her to call that office to schedule her appointment. Pt reports recently she stopped drinking and has substituted eating sweets for drinking. Gave pt some modifying options for her to snack on instead. Reviewed A1c level of 9.4%. pt reports per her last PCP visit she has had an increase in her A1c to a 10%.   Pt not a candidate for GLP injectable medication due to history of Gastroparesis. Suggested use of CGM for a short time to see if a pattern can be identified with her glucose trends for better control. Pt reports she sweats a lot.   Will follow pt trends  Thanks,  Eloise Hake RN, MSN, BC-ADM Inpatient Diabetes Coordinator Team Pager [phone] (8a-5p)

## 2024-02-04 NOTE — ED Provider Notes (Signed)
 [place] EMERGENCY DEPARTMENT AT [facility] Provider Note   CSN: [id number] Arrival date & time: 02/03/24  1657     History  Chief Complaint  Patient presents with   Generalized Body Aches    Jill Shaw is a 48 y.o. female.  Patient with a history of diabetes, hypertension, COPD, GERD, CKD, fibromyalgia and chronic pain syndrome here with leg swelling, weight gain and shortness of breath.  Reports she has had increased swelling to her feet and legs over the past 4 to 5 days and believes she is gained about 15 pounds.  States compliance with her Lasix  at 40 mg daily.  Has had increased shortness of breath in this timeframe as well.  Shortness of breath is worse with exertion.  She denies chest pain but has had left shoulder pain that has been chronic for the past 1 week.  Denies any fall or trauma. Complains of severe headache over the past several weeks as well as well as elevated blood pressure.  Denies thunderclap onset.  Headache comes and goes and waxes and wanes in severity.  No associated photophobia or phonophobia.  No fever.  No focal weakness, numbness or tingling has worsening of her chronic back pain .  Takes oxycodone  at home for pain but is not helping.  Denies any new fall or injury.  Pain is in the center of his back and radiates down both legs.  No new weakness, numbness, tingling, bowel or bladder incontinence.  No fever or vomiting.        Home Medications Prior to Admission medications   Medication Sig Start Date End Date Taking? Authorizing Provider  Accu-Chek Softclix Lancets lancets USE TO TEST AS DIRECTED UP TO FOUR TIMES DAILY. 03/06/22   Shamleffer, Ibtehal Jaralla, MD  albuterol  (PROVENTIL ) (2.5 MG/3ML) 0.083% nebulizer solution Take 3 mLs (2.5 mg total) by nebulization every 6 (six) hours as needed for wheezing or shortness of breath. 01/31/24 01/30/25  Parrett, Macdonald Savoy, NP  albuterol  (VENTOLIN  HFA) 108 (90 Base) MCG/ACT inhaler Inhale 2 puffs  into the lungs every 6 (six) hours as needed for wheezing or shortness of breath. 11/26/23   [name], NP  amoxicillin  (AMOXIL ) 500 MG capsule Take 1 capsule (500 mg total) by mouth 3 (three) times daily. 01/14/24   Ascencion Lava, DMD  B-D ULTRAFINE III SHORT PEN 31G X 8 MM MISC USE AS DIRECTED EVERY MORNING, NOON, EVENING, AND AT BEDTIME 07/10/22   Shamleffer, Julian Obey, MD  budesonide -formoterol  (SYMBICORT ) 160-4.5 MCG/ACT inhaler Inhale 2 puffs into the lungs 2 (two) times daily. 11/26/23   [name], NP  Continuous Blood Gluc Sensor (FREESTYLE LIBRE 3 SENSOR) MISC Place 1 sensor on the skin every 14 days. Use to check glucose continuously 07/06/22   [name], NP  Dexlansoprazole  30 MG capsule DR TAKE 1 CAPSULE (30 MG TOTAL) BY MOUTH DAILY. NEEDS OFFICE VISIT FOR ADDITIONAL REFILLS 03/26/23   [name], NP  diclofenac  Sodium (VOLTAREN ) 1 % GEL Apply 2 to 4 gram onto painful sites up to 4 times daily if needed, max daily dose: 32 Gram Patient not taking: Reported on 01/11/2024 10/13/23     diclofenac  Sodium (VOLTAREN ) 1 % GEL Apply topically every 4 (four) hours as needed. Patient not taking: Reported on 11/26/2023 11/14/23     diclofenac  Sodium (VOLTAREN ) 1 % GEL Apply 2-4 g topically 4 (four) times daily as needed TO PAINFUL SITES. MAX DAILY DOSE IS 32 GRAMS PER  MD. Patient not taking: Reported on 01/11/2024 12/14/23     diclofenac  Sodium (VOLTAREN ) 1 % GEL Apply 2 to 4 g topically in the morning, at noon, in the evening, and at bedtime if needed Patient not taking: Reported on 01/11/2024 01/10/24     dicyclomine  (BENTYL ) 20 MG tablet Take 1 tablet (20 mg total) by mouth 2 (two) times daily. 11/26/23   [name], NP  empagliflozin  (JARDIANCE ) 10 MG TABS tablet Take 1 tablet (10 mg total) by mouth daily. 11/26/23   [name], NP  furosemide  (LASIX ) 40 MG tablet Take 1 tablet (40 mg total) by mouth daily. 01/18/24   [name], NP  glucose blood (ACCU-CHEK  GUIDE) test strip USE TO TEST BLOOD GLUCOSE AS DIRECTED UP TO 4 TIMES DAILY 11/14/22   [name], NP  HUMALOG  KWIKPEN 200 UNIT/ML KwikPen Inject 60 Units into the skin 3 (three) times daily before meals. 01/29/24   [name], NP  hydrALAZINE  (APRESOLINE ) 25 MG tablet Take 1 tablet (25 mg total) by mouth 3 (three) times daily. 01/18/24   [name], NP  insulin  lispro protamine-lispro (HUMALOG  75/25 MIX) (75-25) 100 UNIT/ML SUSP injection Inject 60 Units into the skin in the morning, at noon, and at bedtime.    [provider]  melatonin 3 MG TABS tablet Take 1 tablet (3 mg total) by mouth at bedtime. 01/08/24   [name], [name], FNP  metoCLOPramide  (REGLAN ) 10 MG tablet Take 1 tablet (10 mg total) by mouth every 8 (eight) hours as needed for nausea. 10/07/23   Nashika Coker, Mara Seminole, MD  metoprolol  succinate (TOPROL -XL) 100 MG 24 hr tablet Take 1 tablet (100 mg total) by mouth daily. Take with or immediately following a meal. 01/18/24   [name], NP  naloxone  (NARCAN ) nasal spray 4 mg/0.1 mL Insert 1 spray into one nostril if poorly responding / turning blue. CALL 911 ASAP 10/13/23     nicotine  (NICODERM CQ  - DOSED IN MG/24 HOURS) 21 mg/24hr patch Place 1 patch (21 mg total) onto the skin daily. Patient not taking: Reported on 01/11/2024 10/11/23 10/10/24  [name], NP  nicotine  polacrilex (GOODSENSE NICOTINE ) 2 MG gum Take 1 each (2 mg total) by mouth as needed for smoking cessation. Patient not taking: Reported on 11/26/2023 09/26/23   [name], NP  nitroGLYCERIN  (NITROSTAT ) 0.4 MG SL tablet Place 1 tablet (0.4 mg total) under the tongue every 5 (five) minutes as needed for chest pain. Additional refills to be filled by PCP, patient aware 09/06/23   [name], NP  norethindrone  (AYGESTIN ) 5 MG tablet Take 1 tablet (5 mg total) by mouth 2 (two) times daily as needed (bleeding). 01/25/24   Ajewole, Christana, MD  ondansetron  (ZOFRAN -ODT) 4 MG  disintegrating tablet Take 1 tablet (4 mg total) by mouth every 8 (eight) hours as needed for nausea or vomiting. 12/01/23   Barrett, Kandace Organ, PA-C  Oxycodone  HCl 10 MG TABS Take 1 tablet (10 mg total) by mouth in the morning, at noon, in the evening, and at bedtime if needed for pain 01/10/24     pantoprazole  (PROTONIX ) 20 MG tablet Take 1 tablet (20 mg total) by mouth daily. 12/01/23   Barrett, Kandace Organ, PA-C  Pimozide 1 MG TABS Take 1 mg by mouth daily as needed (feeling of facial itching). 02/10/23   [provider]  polyethylene glycol (MIRALAX ) 17 g packet Take 17 g by mouth daily as needed. Patient  not taking: Reported on 01/11/2024 11/28/23   [name], NP  potassium chloride  SA (KLOR-CON  M) 20 MEQ tablet Take 1 tablet (20 mEq total) by mouth daily. 10/29/23   [name], NP  pregabalin  (LYRICA ) 50 MG capsule Take 1 capsule (50 mg total) by mouth at bedtime. Patient not taking: Reported on 12/21/2023 11/14/23     pregabalin  (LYRICA ) 75 MG capsule Take 1 (one) capsule by mouth two times daily, Decrease to bedtime only if feel off balance or overmedicated. Patient not taking: Reported on 01/11/2024 12/14/23     pregabalin  (LYRICA ) 75 MG capsule Take 1 capsule (75 mg total) by mouth 2 (two) times daily.Decrease to bedtime only if feel off balance or overmedicated. 01/10/24     psyllium (METAMUCIL SMOOTH TEXTURE) 58.6 % powder Take 1 packet by mouth 3 (three) times daily. Patient not taking: Reported on 12/21/2023 11/13/23   [name], NP  rosuvastatin  (CRESTOR ) 20 MG tablet TAKE 1 TABLET(20 MG) BY MOUTH DAILY 01/18/24   Patwardhan, Manish J, MD  senna-docusate (SENOKOT-S) 8.6-50 MG tablet Take 1 tablet by mouth daily. Patient not taking: Reported on 12/21/2023 08/06/23   [name], NP  spironolactone  (ALDACTONE ) 25 MG tablet Take 1 tablet (25 mg total) by mouth daily. 01/18/24   Nichols, Tonya S, NP  sucralfate  (CARAFATE ) 1 g tablet Take 1 tablet (1 g total) by mouth 4 (four)  times daily -  with meals and at bedtime. 08/13/23   Zehr, Jessica D, PA-C  tiZANidine  (ZANAFLEX ) 4 MG tablet Take 1 tablet (4 mg total) by mouth every 8 (eight) hours as needed for muscle spasms. 12/21/23   [name], NP  TOUJEO  MAX SOLOSTAR 300 UNIT/ML Solostar Pen Inject 40 Units into the skin in the morning. 07/31/23   Deforest Fast, MD  TOUJEO  MAX SOLOSTAR 300 UNIT/ML Solostar Pen Inject 60 Units into the skin in the morning. Patient taking differently: Inject 40 Units into the skin in the morning. 10/24/23   [name], NP      Allergies    Elavil  Tamra Falling ], Orudis [ketoprofen], Desyrel  [trazodone ], Tylenol  [acetaminophen ], Aspirin , Motrin  [ibuprofen ], Naprosyn [naproxen], Neurontin [gabapentin], Sulfa antibiotics, Ultram  [tramadol ], Victoza [liraglutide], and Zegerid [omeprazole -sodium bicarbonate ]    Review of Systems   Review of Systems  Constitutional:  Positive for activity change and appetite change. Negative for diaphoresis and fever.  HENT:  Negative for congestion and rhinorrhea.   Respiratory:  Positive for chest tightness and shortness of breath.   Cardiovascular:  Positive for leg swelling.  Gastrointestinal:  Negative for nausea and vomiting.  Genitourinary:  Negative for dysuria.  Musculoskeletal:  Positive for back pain.  Skin:  Negative for rash.  Neurological:  Positive for headaches. Negative for dizziness.    all other systems are negative except as noted in the HPI and PMH.   Physical Exam Updated Vital Signs BP (!) 168/94   Pulse 71   Temp 98.3 F (36.8 C)   Resp 18   Ht 5\' 4"  (1.626 m)   Wt 122.5 kg   SpO2 100%   BMI 46.36 kg/m  Physical Exam Vitals and nursing note reviewed.  Constitutional:      General: She is not in acute distress.    Appearance: She is well-developed.  HENT:     Head: Normocephalic and atraumatic.     Mouth/Throat:     Pharynx: No oropharyngeal exudate.  Eyes:     Conjunctiva/sclera: Conjunctivae normal.  Pupils: Pupils are equal, round, and reactive to light.  Neck:     Comments: No meningismus. Cardiovascular:     Rate and Rhythm: Normal rate and regular rhythm.     Heart sounds: Normal heart sounds. No murmur heard. Pulmonary:     Effort: Pulmonary effort is normal. No respiratory distress.     Breath sounds: Normal breath sounds.  Abdominal:     Palpations: Abdomen is soft.     Tenderness: There is no abdominal tenderness. There is no guarding or rebound.  Musculoskeletal:        General: No tenderness. Normal range of motion.     Cervical back: Normal range of motion and neck supple.     Right lower leg: Edema present.     Left lower leg: Edema present.     Comments: Pain with range of motion of left shoulder without bony deformity  5/5 strength in bilateral lower extremities. Ankle plantar and dorsiflexion intact. Great toe extension intact bilaterally. +2 DP and PT pulses. +2 patellar reflexes bilaterally.   Skin:    General: Skin is warm.  Neurological:     Mental Status: She is alert and oriented to person, place, and time.     Cranial Nerves: No cranial nerve deficit.     Motor: No abnormal muscle tone.     Coordination: Coordination normal.     Comments:  5/5 strength throughout. CN 2-12 intact.Equal grip strength.   Psychiatric:        Behavior: Behavior normal.     ED Results / Procedures / Treatments   Labs (all labs ordered are listed, but only abnormal results are displayed) Labs Reviewed  CBC WITH DIFFERENTIAL/PLATELET - Abnormal; Notable for the following components:      Result Value   WBC 11.1 (*)    RDW 16.5 (*)    All other components within normal limits  BASIC METABOLIC PANEL WITH GFR - Abnormal; Notable for the following components:   CO2 20 (*)    Glucose, Bld 203 (*)    Creatinine, Ser 1.75 (*)    GFR, Estimated 36 (*)    All other components within normal limits  BRAIN NATRIURETIC PEPTIDE - Abnormal; Notable for the following components:    B Natriuretic Peptide 178.4 (*)    All other components within normal limits  CBG MONITORING, ED - Abnormal; Notable for the following components:   Glucose-Capillary 163 (*)    All other components within normal limits  RESP PANEL BY RT-PCR (RSV, FLU A&B, COVID)  RVPGX2  CK  TROPONIN I (HIGH SENSITIVITY)  TROPONIN I (HIGH SENSITIVITY)    EKG EKG Interpretation Date/Time:  Monday February 04 2024 03:02:57 EDT Ventricular Rate:  74 PR Interval:  154 QRS Duration:  80 QT Interval:  372 QTC Calculation: 412 R Axis:   20  Text Interpretation: Normal sinus rhythm Low voltage QRS Septal infarct , age undetermined Lateral infarct , age undetermined Abnormal ECG When compared with ECG of 03-Feb-2024 17:29, PREVIOUS ECG IS PRESENT Nonspecific T wave abnormality Confirmed by Earma Gloss [phone]) on 02/04/2024 3:18:38 AM  Radiology CT Angio Chest PE W and/or Wo Contrast Result Date: 02/04/2024 CLINICAL DATA:  48 year old female with sudden severe headache, shortness of breath. EXAM: CT ANGIOGRAPHY CHEST WITH CONTRAST TECHNIQUE: Multidetector CT imaging of the chest was performed using the standard protocol during bolus administration of intravenous contrast. Multiplanar CT image reconstructions and MIPs were obtained to evaluate the vascular anatomy. RADIATION DOSE REDUCTION: This exam  was performed according to the departmental dose-optimization program which includes automated exposure control, adjustment of the mA and/or kV according to patient size and/or use of iterative reconstruction technique. CONTRAST:  75mL OMNIPAQUE  IOHEXOL  350 MG/ML SOLN COMPARISON:  Chest CTA 01/17/2023. Chest radiographs today. FINDINGS: Cardiovascular: Good contrast bolus timing in the pulmonary arterial tree. Little respiratory motion. No pulmonary artery filling defect. Little contrast in the aorta. Cardiomegaly (series 8, image 259). No pericardial effusion. No definite calcified coronary artery plaque.  Mediastinum/Nodes: Stable and negative. Lungs/Pleura: Major airways are patent. Stable lung volumes. Unchanged curvilinear scarring or atelectasis in the bilateral middle lobes, along the right minor fissure. Right middle lobe lung nodule on series 7, image 74 and right upper lobe lung nodule on image 42 are both stable since 12/24/2020 and benign (no follow-up imaging recommended). No pleural effusion, consolidation, or acute lung opacity. Upper Abdomen: Stable and negative visible mostly noncontrast liver, gallbladder, spleen, pancreas, adrenal glands, kidneys and bowel in the upper abdomen. Musculoskeletal: Stable visualized osseous structures. Advanced mid and lower thoracic disc degeneration with vacuum disc. Mild L1 superior endplate compression fracture is stable. Review of the MIP images confirms the above findings. IMPRESSION: 1. Negative for acute pulmonary embolus. 2. Cardiomegaly.  Chronic middle lobe lung scarring. 3. No acute or inflammatory process identified in the Chest. Electronically Signed   By: Marlise Simpers M.D.   On: 02/04/2024 04:33   DG Chest 2 View Result Date: 02/04/2024 CLINICAL DATA:  48 year old female with sudden severe headache, shortness of breath. EXAM: CHEST - 2 VIEW COMPARISON:  Chest radiographs 02/02/2024 and earlier. FINDINGS: Semi upright AP and lateral views at 0333 hours. Chronic linear scarring at the level of the right minor fissure in both lungs is stable, was present on chest CT last year. Stable borderline to mild cardiomegaly. Other mediastinal contours are within normal limits. Visualized tracheal air column is within normal limits. No pneumothorax, pulmonary edema, pleural effusion or acute lung opacity. No acute osseous abnormality identified. Paucity of bowel gas. IMPRESSION: No acute cardiopulmonary abnormality. Electronically Signed   By: Marlise Simpers M.D.   On: 02/04/2024 04:27   CT Head Wo Contrast Result Date: 02/04/2024 CLINICAL DATA:  48 year old female with  sudden severe headache, shortness of breath. EXAM: CT HEAD WITHOUT CONTRAST TECHNIQUE: Contiguous axial images were obtained from the base of the skull through the vertex without intravenous contrast. RADIATION DOSE REDUCTION: This exam was performed according to the departmental dose-optimization program which includes automated exposure control, adjustment of the mA and/or kV according to patient size and/or use of iterative reconstruction technique. COMPARISON:  Brain MRI 01/30/2020.  Head CT 07/27/2023. FINDINGS: Brain: Stable cerebral volume. No midline shift, ventriculomegaly, mass effect, evidence of mass lesion, intracranial hemorrhage or evidence of cortically based acute infarction. Gray-white matter differentiation is within normal limits throughout the brain. Vascular: No suspicious intracranial vascular hyperdensity. Mild Calcified atherosclerosis at the skull base. Skull: Intact.  No acute osseous abnormality identified. Sinuses/Orbits: Visualized paranasal sinuses and mastoids are stable and well aerated. Other: Visualized orbits and scalp soft tissues are stable. IMPRESSION: Stable and negative noncontrast Head CT. Electronically Signed   By: Marlise Simpers M.D.   On: 02/04/2024 04:26   DG Chest 2 View Result Date: 02/02/2024 CLINICAL DATA:  sob EXAM: CHEST - 2 VIEW COMPARISON:  01/08/2024 FINDINGS: Scattered bibasilar interstitial opacities slightly increased from previous. Heart size and mediastinal contours are within normal limits. No effusion. Visualized bones unremarkable. IMPRESSION: Slightly increased bibasilar interstitial opacities. Electronically  Signed   By: Nicoletta Barrier M.D.   On: 02/02/2024 18:39    Procedures Procedures    Medications Ordered in ED Medications - No data to display  ED Course/ Medical Decision Making/ A&P                                 Medical Decision Making Amount and/or Complexity of Data Reviewed Labs: ordered. Decision-making details documented in ED  Course. Radiology: ordered and independent interpretation performed. Decision-making details documented in ED Course. ECG/medicine tests: ordered and independent interpretation performed. Decision-making details documented in ED Course.  Risk Prescription drug management. Decision regarding hospitalization.   Patient with multiple complaints including leg swelling, shortness of breath, weight gain.  Denies chest pain but has 2 weeks of left shoulder pain that is constant and unchanged.  She does have leg swelling on exam but no significant work of breathing or hypoxia.  Chest x-ray does not show acute edema.  Concern for possible CHF exacerbation. EKG shows stable T wave inversions lead III and aVF.  EKG without acute ischemia.  Stable T wave inversions as above.  Chest x-ray with cardiomegaly but no obvious edema.   Labs with mild AKI.  Troponin negative x2 with low suspicion for ACS.   Patient able to ambulate without desaturation.  She does become short of breath however with increased work of breathing.  Denies chest pain.  CT as above shows no evidence of pulmonary embolism.  Echocardiogram 2022 showed ejection fraction of 75% without valvular abnormality  Patient dyspneic with exertion but did not become hypoxic.  Blood pressure remains elevated and she is given IV hydralazine .  She is quite concerned about her elevated blood pressure states she is on 5 blood pressure medications which she is compliant with.  Troponin is negative.  Query mild CHF exacerbation causing her weight gain  Workup consistent with hypertensive urgency, acute kidney injury, likely mild CHF exacerbation.  Low suspicion for ACS.  PE ruled out as above.  Given her persistently elevated blood pressures, dyspnea on exertion and lower extremity edema we will plan admission for diuresis and further titration of her blood pressure medications.  Discussed with Dr. Brice Campi       Final Clinical Impression(s) / ED  Diagnoses Final diagnoses:  None    Rx / DC Orders ED Discharge Orders     None         Gerturde Kuba, Mara Seminole, MD 02/04/24 [phone]

## 2024-02-04 NOTE — ED Notes (Signed)
 Patient ambulated with steady gait, pulse ox remained at 98%-99%, and heart rate remained from 78-82.  Patient stated that she got "a little winded" toward the end of the walk.

## 2024-02-04 NOTE — ED Notes (Signed)
 Patient to CT.

## 2024-02-04 NOTE — ED Notes (Signed)
 Patient stated that she is still hurting "greater than a 10" and would like something for pain in her IV.  EDP made aware.

## 2024-02-04 NOTE — Telephone Encounter (Signed)
 Spoke with patient regarding prior message . Advised patient per Benn Brash was ok to place a order in for a in lab sleep study . Patient's voice was understanding.Nothing else further needed.

## 2024-02-04 NOTE — ED Notes (Signed)
 Patient has multiple complaints of headache, body aches, leg swelling, chest pain, and elevated BP. Patient's lower bilateral legs +1 edema.

## 2024-02-04 NOTE — H&P (Addendum)
 History and Physical    Patient: Jill Shaw:[id number] DOB: Apr 18, 1976 DOA: 02/03/2024 DOS: the patient was seen and examined on 02/04/2024 PCP: [name], NP  Patient coming from: Home  Chief Complaint:  Chief Complaint  Patient presents with   Generalized Body Aches   HPI: Jill Shaw is a 48 y.o. female with medical history significant of chronic HFpEF based on echo from 2022, iron  deficiency anemia, chronic pain involving back pain, knee pain well as lower extremity neuropathic pain, CKD 3, gastroparesis secondary to diabetes, fibromyalgia, generalized anxiety disorder, GERD, obesity with a BMI of 51, sickle cell trait, asthma/COPD, dyslipidemia, hypertension, diabetes mellitus 2 on insulin , history of vertebral compression fracture T11-L1 in 2022.  Patient presented to the ED with 3 to 4 days of generalized body pain and lower extremity swelling.  She was also complaining of severe headache and chest pain.  She had presented to the ED several days before but left before being seen.  On exam patient was noted to have lower extremity edema and an elevated BNP.  Her chest x-ray was unremarkable.  Her labs revealed a slightly elevated creatinine of 1.75 from baseline of 1.21. CT head was unremarkable.  CTA chest was negative for PE.  Hospitalist service was consulted for admission.  In discussion with the patient she was primarily focused on her inadequate pain control.  She was asking to have her short acting narcotic increased.  I explained to her why this would be inappropriate especially in context of her ongoing gastroparesis issues.  Her most severe pain seems to be related to peripheral neuropathy.  I mended that we increase her Lyrica  dose instead.  She also reported that 8 months ago for reason she was unaware her losartan  was stopped and she was started on Toprol  and Apresoline .  Since that time she has had what she reports as uncontrolled blood pressure and intermittent  lower extremity swelling.  Review of Systems: As mentioned in the history of present illness. All other systems reviewed and are negative. Past Medical History:  Diagnosis Date   Abnormal uterine bleeding (AUB)    Arthritis    knees, hands   Atypical chest pain    cardiology--- dr Filiberto Hug did cardiac cath 08-30-2021 showed minimal luminal irregularity involving LAD all other coronaries w/ normal  flow   Chronic diastolic (congestive) heart failure [facility])    cardiologist---- dr Filiberto Hug;  preserved ef   Chronic iron  deficiency anemia    Chronic pain syndrome    followed by pain management--- dr Alessandra Ancona   CKD (chronic kidney disease), stage III (HCC)    Diabetic gastroparesis (HCC)    Diabetic peripheral neuropathy (HCC)    Fibromyalgia    GAD (generalized anxiety disorder)    Generalized abdominal pain    GERD (gastroesophageal reflux disease)    History of acute renal failure 03/21/2023   admission in epic due to N/V/D due ot severe sepsis POA due to UTI   History of chronic gastritis    inflammatory   History of diabetic ketoacidosis    multiple admission's last 3 in epic 06/ 2022;  01/ 2022;   09/ 2021   History of seizure 01/2016   hypoglycemic seizure   History of vertebral compression fracture 12/2020   T11 -- T12 & L1   Hyperlipidemia    Hypertension    followed by pcp   Insulin  dependent type 2 diabetes mellitus (HCC)    uncontrolled,  followed by pcp  Irritable bowel syndrome with constipation    MDD (major depressive disorder)    Moderate COPD (chronic obstructive pulmonary disease) (HCC)    pulmology--- dr Thelda Finney   Moderate persistent asthma    Sickle cell trait (HCC)    Vitamin D  deficiency 10/2019   Wears glasses    Past Surgical History:  Procedure Laterality Date   CATARACT EXTRACTION W/ INTRAOCULAR LENS IMPLANT Left    CESAREAN SECTION  2001   for twins   DILATION AND CURETTAGE OF UTERUS N/A 08/20/2019   Procedure: DILATATION AND CURETTAGE;   Surgeon: Ana Balling, MD;  Location: [facility] OR;  Service: Gynecology;  Laterality: N/A;   DILATION AND CURETTAGE OF UTERUS  05/01/2023   Procedure: DILATATION AND CURETTAGE;  Surgeon: Kiki Pelton, MD;  Location: [facility];  Service: Gynecology;;   ENDOMETRIAL ABLATION N/A 08/20/2019   Procedure: Liane Redman Ablation;  Surgeon: Ana Balling, MD;  Location: [facility];  Service: Gynecology;  Laterality: N/A;   EYE SURGERY Bilateral    laser right and cataract removed left eye   HYSTEROSCOPY N/A 05/01/2023   Procedure: HYSTEROSCOPY;  Surgeon: Kiki Pelton, MD;  Location: [facility];  Service: Gynecology;  Laterality: N/A;   INTRAUTERINE DEVICE (IUD) INSERTION N/A 05/01/2023   Procedure: INTRAUTERINE DEVICE (IUD) INSERTION;  Surgeon: Kiki Pelton, MD;  Location: [facility];  Service: Gynecology;  Laterality: N/A;   LEFT HEART CATH AND CORONARY ANGIOGRAPHY N/A 08/30/2021   Procedure: LEFT HEART CATH AND CORONARY ANGIOGRAPHY;  Surgeon: Cody Das, MD;  Location: [facility];  Service: Cardiovascular;  Laterality: N/A;   RADIOLOGY WITH ANESTHESIA N/A 09/16/2019   Procedure: MRI WITH ANESTHESIA   L SPINE WITHOUT CONTRAST, T SPINE WITHOUT CONTRAST , CERVICAL WITHOUT CONTRAST;  Surgeon: Radiologist, Medication, MD;  Location: [facility];  Service: Radiology;  Laterality: N/A;   TOOTH EXTRACTION N/A 01/14/2024   Procedure: DENTAL RESTORATION/EXTRACTIONS;  Surgeon: Ascencion Lava, DMD;  Location: [facility];  Service: Oral Surgery;  Laterality: N/A;   TUBAL LIGATION     interval BTL   UPPER GI ENDOSCOPY  07/2017   Social History:  reports that she has been smoking cigarettes. She started smoking about 29 years ago. She has a 6.5 pack-year smoking history. She has never used smokeless tobacco. She reports that she does not drink alcohol and does not use drugs.    Family History  Problem Relation Age of Onset   Diabetes Mother    Hypertension  Mother    Colon cancer Maternal Grandfather    Migraines Paternal Grandfather    Colon cancer Maternal Aunt    Breast cancer Maternal Aunt    Esophageal cancer Neg Hx    Stomach cancer Neg Hx    Rectal cancer Neg Hx     Prior to Admission medications   Medication Sig Start Date End Date Taking? Authorizing Provider  Accu-Chek Softclix Lancets lancets USE TO TEST AS DIRECTED UP TO FOUR TIMES DAILY. 03/06/22   Shamleffer, Ibtehal Jaralla, MD  albuterol  (PROVENTIL ) (2.5 MG/3ML) 0.083% nebulizer solution Take 3 mLs (2.5 mg total) by nebulization every 6 (six) hours as needed for wheezing or shortness of breath. 01/31/24 01/30/25  Parrett, Macdonald Savoy, NP  albuterol  (VENTOLIN  HFA) 108 (90 Base) MCG/ACT inhaler Inhale 2 puffs into the lungs every 6 (six) hours as needed for wheezing or shortness of breath. 11/26/23   [name], NP  amoxicillin  (AMOXIL ) 500 MG capsule Take 1 capsule (500 mg  total) by mouth 3 (three) times daily. 01/14/24   Ascencion Lava, DMD  B-D ULTRAFINE III SHORT PEN 31G X 8 MM MISC USE AS DIRECTED EVERY MORNING, NOON, EVENING, AND AT BEDTIME 07/10/22   Shamleffer, Ibtehal Jaralla, MD  budesonide -formoterol  (SYMBICORT ) 160-4.5 MCG/ACT inhaler Inhale 2 puffs into the lungs 2 (two) times daily. 11/26/23   [name], NP  Continuous Blood Gluc Sensor (FREESTYLE LIBRE 3 SENSOR) MISC Place 1 sensor on the skin every 14 days. Use to check glucose continuously 07/06/22   Nichols, Tonya S, NP  Dexlansoprazole  30 MG capsule DR TAKE 1 CAPSULE (30 MG TOTAL) BY MOUTH DAILY. NEEDS OFFICE VISIT FOR ADDITIONAL REFILLS 03/26/23   [name], NP  diclofenac  Sodium (VOLTAREN ) 1 % GEL Apply 2 to 4 gram onto painful sites up to 4 times daily if needed, max daily dose: 32 Gram Patient not taking: Reported on 01/11/2024 10/13/23     diclofenac  Sodium (VOLTAREN ) 1 % GEL Apply topically every 4 (four) hours as needed. Patient not taking: Reported on 11/26/2023 11/14/23     diclofenac  Sodium  (VOLTAREN ) 1 % GEL Apply 2-4 g topically 4 (four) times daily as needed TO PAINFUL SITES. MAX DAILY DOSE IS 32 GRAMS PER MD. Patient not taking: Reported on 01/11/2024 12/14/23     diclofenac  Sodium (VOLTAREN ) 1 % GEL Apply 2 to 4 g topically in the morning, at noon, in the evening, and at bedtime if needed Patient not taking: Reported on 01/11/2024 01/10/24     dicyclomine  (BENTYL ) 20 MG tablet Take 1 tablet (20 mg total) by mouth 2 (two) times daily. 11/26/23   [name], NP  empagliflozin  (JARDIANCE ) 10 MG TABS tablet Take 1 tablet (10 mg total) by mouth daily. 11/26/23   [name], NP  furosemide  (LASIX ) 40 MG tablet Take 1 tablet (40 mg total) by mouth daily. 01/18/24   [name], NP  glucose blood (ACCU-CHEK GUIDE) test strip USE TO TEST BLOOD GLUCOSE AS DIRECTED UP TO 4 TIMES DAILY 11/14/22   [name], NP  HUMALOG  KWIKPEN 200 UNIT/ML KwikPen Inject 60 Units into the skin 3 (three) times daily before meals. 01/29/24   [name], NP  hydrALAZINE  (APRESOLINE ) 25 MG tablet Take 1 tablet (25 mg total) by mouth 3 (three) times daily. 01/18/24   [name], NP  insulin  lispro protamine-lispro (HUMALOG  75/25 MIX) (75-25) 100 UNIT/ML SUSP injection Inject 60 Units into the skin in the morning, at noon, and at bedtime.    [provider]  melatonin 3 MG TABS tablet Take 1 tablet (3 mg total) by mouth at bedtime. 01/08/24   [name], [name], FNP  metoCLOPramide  (REGLAN ) 10 MG tablet Take 1 tablet (10 mg total) by mouth every 8 (eight) hours as needed for nausea. 10/07/23   Rancour, Mara Seminole, MD  metoprolol  succinate (TOPROL -XL) 100 MG 24 hr tablet Take 1 tablet (100 mg total) by mouth daily. Take with or immediately following a meal. 01/18/24   [name], NP  naloxone  (NARCAN ) nasal spray 4 mg/0.1 mL Insert 1 spray into one nostril if poorly responding / turning blue. CALL 911 ASAP 10/13/23     nicotine  (NICODERM CQ  - DOSED IN MG/24 HOURS) 21 mg/24hr patch  Place 1 patch (21 mg total) onto the skin daily. Patient not taking: Reported on 01/11/2024 10/11/23 10/10/24  [name], NP  nicotine  polacrilex (GOODSENSE NICOTINE ) 2 MG gum Take 1 each (2 mg total) by mouth  as needed for smoking cessation. Patient not taking: Reported on 11/26/2023 09/26/23   [name], NP  nitroGLYCERIN  (NITROSTAT ) 0.4 MG SL tablet Place 1 tablet (0.4 mg total) under the tongue every 5 (five) minutes as needed for chest pain. Additional refills to be filled by PCP, patient aware 09/06/23   [name], NP  norethindrone  (AYGESTIN ) 5 MG tablet Take 1 tablet (5 mg total) by mouth 2 (two) times daily as needed (bleeding). 01/25/24   Ajewole, Christana, MD  ondansetron  (ZOFRAN -ODT) 4 MG disintegrating tablet Take 1 tablet (4 mg total) by mouth every 8 (eight) hours as needed for nausea or vomiting. 12/01/23   Barrett, Kandace Organ, PA-C  Oxycodone  HCl 10 MG TABS Take 1 tablet (10 mg total) by mouth in the morning, at noon, in the evening, and at bedtime if needed for pain 01/10/24     pantoprazole  (PROTONIX ) 20 MG tablet Take 1 tablet (20 mg total) by mouth daily. 12/01/23   Barrett, Kandace Organ, PA-C  Pimozide 1 MG TABS Take 1 mg by mouth daily as needed (feeling of facial itching). 02/10/23   [provider]  polyethylene glycol (MIRALAX ) 17 g packet Take 17 g by mouth daily as needed. Patient not taking: Reported on 01/11/2024 11/28/23   [name], NP  potassium chloride  SA (KLOR-CON  M) 20 MEQ tablet Take 1 tablet (20 mEq total) by mouth daily. 10/29/23   [name], NP  pregabalin  (LYRICA ) 50 MG capsule Take 1 capsule (50 mg total) by mouth at bedtime. Patient not taking: Reported on 12/21/2023 11/14/23     pregabalin  (LYRICA ) 75 MG capsule Take 1 (one) capsule by mouth two times daily, Decrease to bedtime only if feel off balance or overmedicated. Patient not taking: Reported on 01/11/2024 12/14/23     pregabalin  (LYRICA ) 75 MG capsule Take 1 capsule (75 mg  total) by mouth 2 (two) times daily.Decrease to bedtime only if feel off balance or overmedicated. 01/10/24     psyllium (METAMUCIL SMOOTH TEXTURE) 58.6 % powder Take 1 packet by mouth 3 (three) times daily. Patient not taking: Reported on 12/21/2023 11/13/23   [name], NP  rosuvastatin  (CRESTOR ) 20 MG tablet TAKE 1 TABLET(20 MG) BY MOUTH DAILY 01/18/24   Patwardhan, Manish J, MD  senna-docusate (SENOKOT-S) 8.6-50 MG tablet Take 1 tablet by mouth daily. Patient not taking: Reported on 12/21/2023 08/06/23   [name], NP  spironolactone  (ALDACTONE ) 25 MG tablet Take 1 tablet (25 mg total) by mouth daily. 01/18/24   [name], NP  sucralfate  (CARAFATE ) 1 g tablet Take 1 tablet (1 g total) by mouth 4 (four) times daily -  with meals and at bedtime. 08/13/23   Zehr, Jessica D, PA-C  tiZANidine  (ZANAFLEX ) 4 MG tablet Take 1 tablet (4 mg total) by mouth every 8 (eight) hours as needed for muscle spasms. 12/21/23   [name], NP  TOUJEO  MAX SOLOSTAR 300 UNIT/ML Solostar Pen Inject 40 Units into the skin in the morning. 07/31/23   Deforest Fast, MD  TOUJEO  MAX SOLOSTAR 300 UNIT/ML Solostar Pen Inject 60 Units into the skin in the morning. Patient taking differently: Inject 40 Units into the skin in the morning. 10/24/23   [name], NP    Physical Exam: Vitals:   02/04/24 0600 02/04/24 0715 02/04/24 0738 02/04/24 0953  BP: (!) 170/72 (!) 179/77 (!) 189/88   Pulse: 66 69 73   Resp: (!) 21 15    Temp:  98.3 F (36.8 C)  TempSrc:    Oral  SpO2: 100% 100%    Weight:      Height:       Constitutional: NAD, calm, comfortable Respiratory: Expiratory crackles primarily in the right mid field to the base but some on the left.  Normal respiratory effort. No accessory muscle use.  Cardiovascular: Regular rate and rhythm, no murmurs / rubs / gallops.  1+ bilateral lower extremity edema. 2+ pedal pulses.  Abdomen: no tenderness, no masses palpated. No hepatosplenomegaly. Bowel  sounds positive.  Musculoskeletal: no clubbing / cyanosis. No joint deformity upper and lower extremities. Good ROM, no contractures. Normal muscle tone.  Skin: no rashes, lesions, ulcers. No induration Neurologic: CN 2-12 grossly intact. Sensation intact, DTR normal. Strength 5/5 x all 4 extremities.  Psychiatric: Normal judgment and insight. Alert and oriented x 3. Normal mood.     Data Reviewed:  Sodium 140, potassium 4.6, chloride 110, CO2 20, glucose 203, BUN 13, creatinine 1.75, GFR 36  BNP 178 with normal troponin  WBC 11,100 with normal differential, hemoglobin 13.5, platelets 233,000  PCR for flu, COVID and RSV negative  UA negative for protein  Assessment and Plan: Acute HFpEF in context of uncontrolled hypertension Patient presented with uncontrolled hypertension and peripheral edema as well as elevated BNP with abnormal lung sounds.  This is consistent with heart failure exacerbation Was on Lasix  40 mg p.o as well as Aldactone  25 mg. at home without any improvement in symptoms therefore I have started Lasix  60 mg IV daily along with home dose Aldactone  Focus on improved blood pressure control Resume Cozaar  25 mg daily, continue Toprol  XL 100 mg daily, increase hydralazine  to 50 mg 3 times daily Last echocardiogram was in 2022.  Repeat echocardiogram this admission revealed EF 60-65%, moderate concentric LVH, grade II diastolic dysfunction  CKD IIIb with nonanion gap acidosis Suspect subtle worsening in creatinine secondary to uncontrolled hypertension UA without any protein.  Have asked for urine microalbumin as well Avoid offending medications such as NSAIDs Hopefully the addition of ARB will help Follow electrolyte panel Jardiance  had been ordered as an outpatient medication but the patient never picked up-initiate here-unclear if patient has an economic reason for not picking this medication up  Chronic pain/known fibromyalgia/diabetic peripheral neuropathy Continue  home dose of short acting Oxy 10 mg 4 times daily as needed Discussed with patient rationale for not increasing narcotic dosage Primary report of pain is more related to her peripheral neuropathy therefore will increase Lyrica  to 100 mg 3 times daily Patient states she takes laxatives for constipation prophylaxis CPK normal  Diabetes mellitus 2 on insulin  Patient reports takes Humalog  75/25 60 units daily.  Only 70/30 available here and since will be on a stricter carbohydrate modified diet while hospitalized administer 45 units daily Follow CBGs and provide SSI Hemoglobin A1c in February was 9.4 Jardiance  was ordered in the outpatient setting but never picked up-this has been reordered  HLD Continue Crestor   Obesity with BMI 51 Weight reduction strategies per PCP Given her underlying gastroparesis she is not a candidate for a GLP-1 medication  Asthma/COPD Only not in any type of exacerbation Continue home Breo Ellipta  Provide DuoNebs as needed    Advance Care Planning:   Code Status: Full Code   VTE prophylaxis: Lovenox   Consults: None  Family Communication: Family at bedside  Severity of Illness: The appropriate patient status for this patient is OBSERVATION. Observation status is judged to be reasonable and necessary in  order to provide the required intensity of service to ensure the patient's safety. The patient's presenting symptoms, physical exam findings, and initial radiographic and laboratory data in the context of their medical condition is felt to place them at decreased risk for further clinical deterioration. Furthermore, it is anticipated that the patient will be medically stable for discharge from the hospital within 2 midnights of admission.   Author: Kathye Parkin, NP 02/04/2024 10:27 AM  For on call review [email or web address].

## 2024-02-04 NOTE — ED Notes (Signed)
 Patient to imaging.

## 2024-02-04 NOTE — ED Notes (Signed)
 CCMD notified, Pt is on monitor.

## 2024-02-04 NOTE — ED Notes (Signed)
Patient requesting something for pain, EDP made aware.

## 2024-02-05 DIAGNOSIS — I5033 Acute on chronic diastolic (congestive) heart failure: Secondary | ICD-10-CM | POA: Diagnosis not present

## 2024-02-05 DIAGNOSIS — E662 Morbid (severe) obesity with alveolar hypoventilation: Secondary | ICD-10-CM

## 2024-02-05 LAB — GLUCOSE, CAPILLARY
Glucose-Capillary: 258 mg/dL — ABNORMAL HIGH (ref 70–99)
Glucose-Capillary: 199 mg/dL — ABNORMAL HIGH (ref 70–99)
Glucose-Capillary: 214 mg/dL — ABNORMAL HIGH (ref 70–99)
Glucose-Capillary: 251 mg/dL — ABNORMAL HIGH (ref 70–99)

## 2024-02-05 LAB — BASIC METABOLIC PANEL WITH GFR
Anion gap: 9 (ref 5–15)
BUN: 17 mg/dL (ref 6–20)
CO2: 21 mmol/L — ABNORMAL LOW (ref 22–32)
Calcium: 9.2 mg/dL (ref 8.9–10.3)
Chloride: 111 mmol/L (ref 98–111)
Creatinine, Ser: 1.5 mg/dL — ABNORMAL HIGH (ref 0.44–1.00)
GFR, Estimated: 43 mL/min — ABNORMAL LOW (ref >60)
Glucose, Bld: 190 mg/dL — ABNORMAL HIGH (ref 70–99)
Potassium: 5 mmol/L (ref 3.5–5.1)
Sodium: 141 mmol/L (ref 135–145)

## 2024-02-05 LAB — MICROALBUMIN, URINE: Microalb, Ur: 43.4 ug/mL — ABNORMAL HIGH

## 2024-02-05 MED ORDER — INSULIN ASPART PROT & ASPART (70-30 MIX) 100 UNIT/ML ~~LOC~~ SUSP
10.0000 [IU] | Freq: Every day | SUBCUTANEOUS | Status: DC
  Administered 2024-02-05 – 2024-02-06 (×2): 10 [IU] via SUBCUTANEOUS
  Filled 2024-02-05: qty 10

## 2024-02-05 MED ORDER — LORAZEPAM 0.5 MG PO TABS
0.5000 mg | ORAL_TABLET | Freq: Once | ORAL | Status: AC | PRN
  Administered 2024-02-05: 0.5 mg via ORAL
  Filled 2024-02-05: qty 1

## 2024-02-05 MED ORDER — EMPAGLIFLOZIN 10 MG PO TABS
10.0000 mg | ORAL_TABLET | Freq: Every day | ORAL | Status: DC
  Administered 2024-02-05 – 2024-02-07 (×3): 10 mg via ORAL
  Filled 2024-02-05 (×3): qty 1

## 2024-02-05 MED ORDER — FUROSEMIDE 10 MG/ML IJ SOLN: 40.0000 mg | Freq: Two times a day (BID) | INTRAMUSCULAR | Status: DC

## 2024-02-05 MED ORDER — LOSARTAN POTASSIUM 25 MG PO TABS
25.0000 mg | ORAL_TABLET | Freq: Every day | ORAL | Status: DC
  Administered 2024-02-05 – 2024-02-07 (×3): 25 mg via ORAL
  Filled 2024-02-05 (×3): qty 1

## 2024-02-05 MED ORDER — FUROSEMIDE 10 MG/ML IJ SOLN
INTRAMUSCULAR | Status: AC
  Filled 2024-02-05: qty 4

## 2024-02-05 MED ORDER — SPIRONOLACTONE 25 MG PO TABS
25.0000 mg | ORAL_TABLET | Freq: Every day | ORAL | Status: DC
  Administered 2024-02-05 – 2024-02-07 (×3): 25 mg via ORAL
  Filled 2024-02-05 (×3): qty 1

## 2024-02-05 MED ORDER — FUROSEMIDE 10 MG/ML IJ SOLN
60.0000 mg | Freq: Two times a day (BID) | INTRAMUSCULAR | Status: DC
  Administered 2024-02-05: 60 mg via INTRAVENOUS
  Filled 2024-02-05: qty 6

## 2024-02-05 MED ORDER — MELATONIN 5 MG PO TABS
10.0000 mg | ORAL_TABLET | Freq: Every evening | ORAL | Status: DC | PRN
  Administered 2024-02-05 – 2024-02-06 (×2): 10 mg via ORAL
  Filled 2024-02-05 (×2): qty 2

## 2024-02-05 NOTE — Assessment & Plan Note (Signed)
 Relation between opiates and gastroparesis discussed

## 2024-02-05 NOTE — Assessment & Plan Note (Signed)
 Patient with trifecta of OHS, COPD (FEV1 61% in 2020) and probable OSA (needs inpatient sleep study, which is pending).  This is exacerbated by chronic oxycodone  use.  No active COPD flare. - Strongly encouraged PSG - Strongly discouraged escalating opiates

## 2024-02-05 NOTE — Inpatient Diabetes Management (Signed)
 Inpatient Diabetes Program Recommendations  AACE/ADA: New Consensus Statement on Inpatient Glycemic Control (2015)  Target Ranges:  Prepandial:   less than 140 mg/dL      Peak postprandial:   less than 180 mg/dL (1-2 hours)      Critically ill patients:  140 - 180 mg/dL   Lab Results  Component Value Date   GLUCAP 258 (H) 02/05/2024   HGBA1C 9.4 (A) 11/26/2023    Latest Reference Range & Units 02/04/24 07:21 02/04/24 12:05 02/04/24 16:34 02/04/24 21:19 02/05/24 07:51  Glucose-Capillary 70 - 99 mg/dL 161 (H) 096 (H) 045 (H) 220 (H) 258 (H)  (H): Data is abnormally high  Diabetes history: DM 2 Outpatient Diabetes medications: 75/25 60 units tid, Toujeo  60 units Daily, Jardiance  10 mg Daily (pt reports the reason its reported she didn't pick it up is because she had a supply at home, she has been taking every day) Current orders for Inpatient glycemic control:  Jardiance  10 mg Daily Novolog  0-15 units tid + hs Novolog  70/30 45 units Daily  Inpatient Diabetes Program Recommendations:   Please consider: -Add 70/30 10 units pm ac dinner  Thank you, Marily Shows E. Fallon Howerter, RN, MSN, CDCES  Diabetes Coordinator Inpatient Glycemic Control Team Team Pager [phone] (8am-5pm) 02/05/2024 11:30 AM

## 2024-02-05 NOTE — Hospital Course (Addendum)
 Jill Shaw is a 48 y.o. female with PMH DM, gastroparesis, chronic pain on daily opiates, HTN, COPD, dCHF, CKD IIIa, OHS who presented with diffuse pain, leg swelling, found to have Cr 1.7, peripheral edema, elevated BNP, 15lb weight gain.   She was admitted for CHF exacerbation treatment with diuresis.

## 2024-02-05 NOTE — Assessment & Plan Note (Addendum)
-   continue home regimen  

## 2024-02-05 NOTE — Assessment & Plan Note (Addendum)
 Net negative 1L overnight, K stable, Cr improving.  Intake weight must be carried forward/inaccurate.  Weight this morning is ~15lbs over baseline.  Echo this admission shows preserved EF  Lasix  this morning delayed due to lack of labs. -Furosemide  60 mg IV twice a day  -K supplement -Strict I/Os, daily weights, telemetry  -Daily monitoring renal function

## 2024-02-05 NOTE — Assessment & Plan Note (Signed)
 -  Continue ICS/LABA

## 2024-02-05 NOTE — Assessment & Plan Note (Addendum)
 Patient has chronic back pain from a vertebral compression fracture, and chronic knee arthritis and neuropathy.  We had a long discussion about this.  Risks of titrating oxycodone  were discussed - Continue home oxycodone  - Continue Lyrica , tizanidine ; she asked for higher lyrica  dose, this was given on admission and she says did help; cautioned her on increasing meds for no reason, but for now will send home on lyrica  100mg  TID and defer further refills to her PCP - Outpatient PSG needed - Outpatient follow up with pain medicine recommended

## 2024-02-05 NOTE — Assessment & Plan Note (Deleted)
 AKI on chronic kidney disease stage IIIa Cr 1.6 on admission due to congestion.  - improvement on lasix , now some bump - lasix  decreased, will see response

## 2024-02-05 NOTE — Progress Notes (Signed)
 IVT consult placed for lab draw from previous shift. [place] sent to day shift RN that we are unable to assist with phlebotomy draws.

## 2024-02-05 NOTE — Assessment & Plan Note (Signed)
 BMI 48, complicates care

## 2024-02-05 NOTE — Assessment & Plan Note (Addendum)
-   elevated and uncontrolled on admission  - resume home regimen

## 2024-02-05 NOTE — Progress Notes (Signed)
 Mobility Specialist Progress Note;   02/05/24 0926  Mobility  Activity Ambulated with assistance in hallway  Level of Assistance Contact guard assist, steadying assist  Assistive Device None  Distance Ambulated (ft) 215 ft  Activity Response Tolerated fair  Mobility Referral Yes  Mobility visit 1 Mobility  Mobility Specialist Start Time (ACUTE ONLY) [id number]  Mobility Specialist Stop Time (ACUTE ONLY) [id number]  Mobility Specialist Time Calculation (min) (ACUTE ONLY) 12 min   MD requesting pt to ambulate. Required light MinG assistance during ambulation for safety. Took 1x standing rest break d/t SOB. Pt did not know how to describe her breathing, however stated it felt like she was having to catch her breath while ambulating. Dyspnea displayed once returned back to bed, however SPO2 100%. Pt left in bed with all needs met. MD notified.   Janit Meline Mobility Specialist Please contact via SecureChat or [employer] [phone]

## 2024-02-05 NOTE — Progress Notes (Signed)
 Progress Note   Patient: Jill Shaw:[id number] DOB: 04-07-1976 DOA: 02/03/2024     1 DOS: the patient was seen and examined on 02/05/2024 at 11:30AM      Brief hospital course: 48 y.o. F with MO, DM, gastroparesis, chronic pain on daily opiates, HTN, COPD, dCHF, CKD IIIa basleine 1.2, OHS who presented with diffuse pain, leg swelling, found to have Cr 1.7, peripheral edema, elevated BNP, 15lb weight gain.    Admitted on IV Lasix .     Assessment and Plan: * Acute on chronic heart failure with preserved ejection fraction (HFpEF) (HCC) Net negative 1L overnight, K stable, Cr improving.  Intake weight must be carried forward/inaccurate.  Weight this morning is ~15lbs over baseline.  Echo this admission shows preserved EF  Lasix  this morning delayed due to lack of labs. -Furosemide  60 mg IV twice a day  -K supplement -Strict I/Os, daily weights, telemetry  -Daily monitoring renal function    AKI (acute kidney injury) (HCC) AKI on chronic kidney disease stage IIIa Cr 1.6 on admission due to congestion.  Improved overnight with diuresis. - Continue furosemide  - Daily BMP  - Okay to continue spironolactone , new losartan , Jardiance   Obesity hypoventilation syndrome (HCC) Patient with trifecta of OHS, COPD (FEV1 61% in 2020) and probable OSA (needs inpatient sleep study, which is pending).  This is exacerbated by chronic oxycodone  use.  No active COPD flare. - Strongly encouraged PSG - Strongly discouraged escalating opiates  Mixed hyperlipidemia - Continue Crestor   Type 2 diabetes mellitus (HCC) Glucose elevated - Continue 70/30 - Continue new Jardiance  - Continue SS insulin   Diabetic gastroparesis (HCC) Relation between opiates and gastroparesis discussed  Chronic pain syndrome Patient has chronic back pain from a vertebral compression fracture, and chronic knee arthritis and neuropathy.  We had a long discussion about this.  Risks of titrating oxycodone  were  discussed - Continue home oxycodone  10 mg four times daily - Continue Lyrica , tizanidine  - Outpatient PSG needed - Outpatient follow up with pain medicine recommended  Chronic obstructive pulmonary disease (HCC) - Continue ICS/LABA  Obesity, Class III, BMI 40-49.9 (morbid obesity) (HCC) BMI 48, complicates care  Essential hypertension Hypertensive emergency Blood pressure 160-190 mmHg systolic on admission with CHF.  Started on orals, BP improved.   - Continue Lasix  - Continue new losartan , spironolactone  - Continue metoprolol , hydralazine           Subjective: Patient reports that she still swollen, she ambulated with mobility tech earlier in felt chest pressure and shortness of breath, similar to her presenting symptoms, she continues to have orthopnea.     Physical Exam: BP (!) 145/73 (BP Location: Left Wrist)   Pulse 70   Temp 98.6 F (37 C) (Oral)   Resp 17   Ht 5\' 4"  (1.626 m)   Wt 128.9 kg   SpO2 97%   BMI 48.78 kg/m   Obese adult female, sitting up in bed, interactive and appropriate RRR, no murmurs, 2+ pitting edema Respiratory rate normal, lung sounds diminished due to body habitus Abdomen soft without tenderness palpation or guarding Attention normal, affect appropriate, judgment and insight appear normal, face symmetric, speech fluent    Data Reviewed: Creatinine improved to 1.5 BNP 178 Troponin negative Echocardiogram normal EF CT head and CT angiogram of the chest unremarkable  Family Communication: None present    Disposition: Status is: Inpatient 48 year old female with diastolic heart failure, obesity hypoventilation, chronic opiate use, likely sleep apnea who presented with hypertensive urgency precipitating CHF flare  He is about 15 pounds up, so I suspect she will need several more days diuresis  Close attention to renal function given new start losartan , spironolactone         Author: Ephriam Hashimoto, MD 02/05/2024  2:05 PM  For on call review [email or web address].

## 2024-02-05 NOTE — Assessment & Plan Note (Signed)
 Continue Crestor

## 2024-02-05 NOTE — Progress Notes (Signed)
 TRH night cross cover note:   I was notified by RN that the patient is requesting an additional sleep aid.  She received a dose of hydroxyzine  for sleep last night, although this was ineffective.  Additionally, she is allergic to trazodone , and has had refractory insomnia during this hospitalization and spite of 3 mg of melatonin.  I subsequently increased her evening prn dose of melatonin from 3 mg to 10 mg and added a one-time prn dose of 0.5 mg of oral Ativan  for insomnia.     Camelia Cavalier, DO Hospitalist

## 2024-02-06 ENCOUNTER — Telehealth: Payer: Self-pay | Admitting: Nurse Practitioner

## 2024-02-06 DIAGNOSIS — N1831 Chronic kidney disease, stage 3a: Secondary | ICD-10-CM | POA: Diagnosis not present

## 2024-02-06 DIAGNOSIS — J449 Chronic obstructive pulmonary disease, unspecified: Secondary | ICD-10-CM | POA: Diagnosis not present

## 2024-02-06 DIAGNOSIS — I5033 Acute on chronic diastolic (congestive) heart failure: Secondary | ICD-10-CM | POA: Diagnosis not present

## 2024-02-06 DIAGNOSIS — N179 Acute kidney failure, unspecified: Secondary | ICD-10-CM

## 2024-02-06 DIAGNOSIS — G4700 Insomnia, unspecified: Secondary | ICD-10-CM | POA: Diagnosis not present

## 2024-02-06 LAB — CBC
HCT: 41.1 % (ref 36.0–46.0)
Hemoglobin: 13.4 g/dL (ref 12.0–15.0)
MCH: 27.9 pg (ref 26.0–34.0)
MCHC: 32.6 g/dL (ref 30.0–36.0)
MCV: 85.6 fL (ref 80.0–100.0)
Platelets: 227 10*3/uL (ref 150–400)
RBC: 4.8 MIL/uL (ref 3.87–5.11)
RDW: 15.9 % — ABNORMAL HIGH (ref 11.5–15.5)
WBC: 9.1 10*3/uL (ref 4.0–10.5)
nRBC: 0 % (ref 0.0–0.2)

## 2024-02-06 LAB — BASIC METABOLIC PANEL WITH GFR
Anion gap: 11 (ref 5–15)
BUN: 26 mg/dL — ABNORMAL HIGH (ref 6–20)
CO2: 26 mmol/L (ref 22–32)
Calcium: 9.4 mg/dL (ref 8.9–10.3)
Chloride: 103 mmol/L (ref 98–111)
Creatinine, Ser: 1.69 mg/dL — ABNORMAL HIGH (ref 0.44–1.00)
GFR, Estimated: 37 mL/min — ABNORMAL LOW (ref >60)
Glucose, Bld: 249 mg/dL — ABNORMAL HIGH (ref 70–99)
Potassium: 4.3 mmol/L (ref 3.5–5.1)
Sodium: 140 mmol/L (ref 135–145)

## 2024-02-06 LAB — GLUCOSE, CAPILLARY
Glucose-Capillary: 292 mg/dL — ABNORMAL HIGH (ref 70–99)
Glucose-Capillary: 238 mg/dL — ABNORMAL HIGH (ref 70–99)
Glucose-Capillary: 267 mg/dL — ABNORMAL HIGH (ref 70–99)
Glucose-Capillary: 228 mg/dL — ABNORMAL HIGH (ref 70–99)

## 2024-02-06 MED ORDER — FUROSEMIDE 10 MG/ML IJ SOLN
40.0000 mg | Freq: Every day | INTRAMUSCULAR | Status: DC
  Administered 2024-02-06 – 2024-02-07 (×2): 40 mg via INTRAVENOUS
  Filled 2024-02-06: qty 4

## 2024-02-06 MED ORDER — LIVING WELL WITH DIABETES BOOK
Freq: Once | Status: AC
  Filled 2024-02-06: qty 1

## 2024-02-06 MED ORDER — ACETAMINOPHEN 500 MG PO TABS
1000.0000 mg | ORAL_TABLET | Freq: Four times a day (QID) | ORAL | Status: DC | PRN
  Administered 2024-02-06: 1000 mg via ORAL
  Filled 2024-02-06 (×2): qty 2

## 2024-02-06 MED ORDER — NICOTINE POLACRILEX 2 MG MT GUM
2.0000 mg | CHEWING_GUM | OROMUCOSAL | Status: DC | PRN
  Filled 2024-02-06: qty 1

## 2024-02-06 MED ORDER — ZOLPIDEM TARTRATE 5 MG PO TABS
5.0000 mg | ORAL_TABLET | Freq: Every evening | ORAL | Status: DC | PRN
  Administered 2024-02-06: 5 mg via ORAL
  Filled 2024-02-06: qty 1
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
## 2024-02-06 NOTE — Telephone Encounter (Signed)
 Copied from CRM [id number]. Topic: General - Other >> Feb 06, 2024  2:01 PM [name] wrote: Reason for CRM: The patient wants her provider to know a fax form will be coming over from [employer] concerning her chuck pads and items. She just needs it filled out so she can get her items. Please be on the lookout and assist further

## 2024-02-06 NOTE — Assessment & Plan Note (Signed)
 AKI on chronic kidney disease stage IIIa Cr 1.6 on admission due to congestion.  - improvement on lasix , now some bump - lasix  decreased, will see response

## 2024-02-06 NOTE — Progress Notes (Signed)
 Progress Note    Jill Shaw   WUJ:[id number]  DOB: May 19, 1976  DOA: 02/03/2024     2 PCP: [name], NP  Initial CC: LE swelling, SOB  Hospital Course: 48 y.o. F with MO, DM, gastroparesis, chronic pain on daily opiates, HTN, COPD, dCHF, CKD IIIa basleine 1.2, OHS who presented with diffuse pain, leg swelling, found to have Cr 1.7, peripheral edema, elevated BNP, 15lb weight gain.     Admitted on IV Lasix .  Interval History:  Breathing a little better this morning.  Has been diuresing since admission.  Ambulating easily in the room as well.  Assessment and Plan: * Acute on chronic heart failure with preserved ejection fraction (HFpEF) (HCC) - diuresing on lasix  but some bump in creat now - decrease lasix  dose and repeat BMP in am - Also encouraged her for following low-sodium diet    Acute renal failure superimposed on stage 3a chronic kidney disease (HCC) AKI on chronic kidney disease stage IIIa Cr 1.6 on admission due to congestion.  - improvement on lasix , now some bump - lasix  decreased, will see response   Obesity hypoventilation syndrome (HCC) Patient with trifecta of OHS, COPD (FEV1 61% in 2020) and probable OSA (needs inpatient sleep study, which is pending).  This is exacerbated by chronic oxycodone  use.  No active COPD flare. - Strongly encouraged PSG - Strongly discouraged escalating opiates  Mixed hyperlipidemia - Continue Crestor   Type 2 diabetes mellitus (HCC) Glucose elevated - Continue 70/30 - Continue new Jardiance  - Continue SS insulin   Diabetic gastroparesis (HCC) Relation between opiates and gastroparesis discussed  Chronic pain syndrome Patient has chronic back pain from a vertebral compression fracture, and chronic knee arthritis and neuropathy.  We had a long discussion about this.  Risks of titrating oxycodone  were discussed - Continue home oxycodone  10 mg four times daily - Continue Lyrica , tizanidine  - Outpatient PSG needed -  Outpatient follow up with pain medicine recommended  Chronic obstructive pulmonary disease (HCC) - Continue ICS/LABA  Obesity, Class III, BMI 40-49.9 (morbid obesity) (HCC) BMI 48, complicates care  Essential hypertension Blood pressure 160-190 mmHg systolic on admission with CHF.  Started on orals, BP improved.   - Continue Lasix  - Continue new losartan , spironolactone  - Continue metoprolol , hydralazine     Old records reviewed in assessment of this patient  Antimicrobials:   DVT prophylaxis:     Code Status:   Code Status: Full Code  Mobility Assessment (Last 72 Hours)     Mobility Assessment     Row Name 02/06/24 0800 02/05/24 2000 02/05/24 0813 02/04/24 2100     Does patient have an order for bedrest or is patient medically unstable No - Continue assessment No - Continue assessment No - Continue assessment No - Continue assessment    What is the highest level of mobility based on the progressive mobility assessment? Level 6 (Walks independently in room and hall) - Balance while walking in room without assist - Complete Level 6 (Walks independently in room and hall) - Balance while walking in room without assist - Complete Level 6 (Walks independently in room and hall) - Balance while walking in room without assist - Complete Level 6 (Walks independently in room and hall) - Balance while walking in room without assist - Complete             Barriers to discharge:  Disposition Plan: Home HH orders placed: N/A Status is: Inpatient  Objective: Blood pressure (!) 151/85, pulse 78, temperature  98.2 F (36.8 C), temperature source Oral, resp. rate 18, height 5\' 4"  (1.626 m), weight 127.2 kg, SpO2 98%.  Examination:  Physical Exam Constitutional:      Appearance: Normal appearance. She is obese.  HENT:     Head: Normocephalic and atraumatic.     Mouth/Throat:     Mouth: Mucous membranes are moist.  Eyes:     Extraocular Movements: Extraocular movements intact.   Cardiovascular:     Rate and Rhythm: Normal rate and regular rhythm.  Pulmonary:     Effort: Pulmonary effort is normal. No respiratory distress.     Breath sounds: Normal breath sounds. No wheezing.  Abdominal:     General: Bowel sounds are normal. There is no distension.     Palpations: Abdomen is soft.     Tenderness: There is no abdominal tenderness.  Musculoskeletal:        General: Normal range of motion.     Cervical back: Normal range of motion and neck supple.     Right lower leg: Edema present.     Left lower leg: Edema present.  Skin:    General: Skin is warm and dry.  Neurological:     General: No focal deficit present.     Mental Status: She is alert.  Psychiatric:        Mood and Affect: Mood normal.      Consultants:    Procedures:    Data Reviewed: Results for orders placed or performed during the hospital encounter of 02/03/24 (from the past 24 hours)  Glucose, capillary     Status: Abnormal   Collection Time: 02/05/24  9:28 PM  Result Value Ref Range   Glucose-Capillary 251 (H) 70 - 99 mg/dL  CBC     Status: Abnormal   Collection Time: 02/06/24  5:48 AM  Result Value Ref Range   WBC 9.1 4.0 - 10.5 K/uL   RBC 4.80 3.87 - 5.11 MIL/uL   Hemoglobin 13.4 12.0 - 15.0 g/dL   HCT 16.1 09.6 - 04.5 %   MCV 85.6 80.0 - 100.0 fL   MCH 27.9 26.0 - 34.0 pg   MCHC 32.6 30.0 - 36.0 g/dL   RDW 40.9 (H) 81.1 - 91.4 %   Platelets 227 150 - 400 K/uL   nRBC 0.0 0.0 - 0.2 %  Basic metabolic panel with GFR     Status: Abnormal   Collection Time: 02/06/24  5:48 AM  Result Value Ref Range   Sodium 140 135 - 145 mmol/L   Potassium 4.3 3.5 - 5.1 mmol/L   Chloride 103 98 - 111 mmol/L   CO2 26 22 - 32 mmol/L   Glucose, Bld 249 (H) 70 - 99 mg/dL   BUN 26 (H) 6 - 20 mg/dL   Creatinine, Ser 7.82 (H) 0.44 - 1.00 mg/dL   Calcium  9.4 8.9 - 10.3 mg/dL   GFR, Estimated 37 (L) >60 mL/min   Anion gap 11 5 - 15  Glucose, capillary     Status: Abnormal   Collection Time:  02/06/24  7:58 AM  Result Value Ref Range   Glucose-Capillary 292 (H) 70 - 99 mg/dL  Glucose, capillary     Status: Abnormal   Collection Time: 02/06/24 11:52 AM  Result Value Ref Range   Glucose-Capillary 238 (H) 70 - 99 mg/dL  Glucose, capillary     Status: Abnormal   Collection Time: 02/06/24  4:31 PM  Result Value Ref Range   Glucose-Capillary 267 (H) 70 -  99 mg/dL   *Note: Due to a large number of results and/or encounters for the requested time period, some results have not been displayed. A complete set of results can be found in Results Review.    I have reviewed pertinent nursing notes, vitals, labs, and images as necessary. I have ordered labwork to follow up on as indicated.  I have reviewed the last notes from staff over past 24 hours. I have discussed patient's care plan and test results with nursing staff, CM/SW, and other staff as appropriate.  Time spent: Greater than 50% of the 55 minute visit was spent in counseling/coordination of care for the patient as laid out in the A&P.   LOS: 2 days   Faith Homes, MD [facility] 02/06/2024, 5:36 PM

## 2024-02-06 NOTE — Inpatient Diabetes Management (Signed)
 Inpatient Diabetes Program Recommendations  AACE/ADA: New Consensus Statement on Inpatient Glycemic Control (2015)  Target Ranges:  Prepandial:   less than 140 mg/dL      Peak postprandial:   less than 180 mg/dL (1-2 hours)      Critically ill patients:  140 - 180 mg/dL   Lab Results  Component Value Date   GLUCAP 238 (H) 02/06/2024   HGBA1C 9.4 (A) 11/26/2023    Latest Reference Range & Units 02/05/24 07:51 02/05/24 11:54 02/05/24 16:23 02/05/24 21:28 02/06/24 07:58 02/06/24 11:52  Glucose-Capillary 70 - 99 mg/dL 027 (H) 253 (H) 664 (H) 251 (H) 292 (H) 238 (H)   Diabetes history: DM 2 Outpatient Diabetes medications: 75/25 60 units tid, Toujeo  60 units Daily, Jardiance  10 mg Daily (pt reports the reason its reported she didn't pick it up is because she had a supply at home, she has been taking every day) Current orders for Inpatient glycemic control:  Jardiance  10 mg Daily Novolog  0-15 units tid + hs Novolog  70/30 45 units Daily Novolog  70/30 10 units qpm  Inpatient Diabetes Program Recommendations:   Please consider: -   Increase 70/30 55 units qam, 20 units qpm  Thanks,  Eloise Hake RN, MSN, BC-ADM Inpatient Diabetes Coordinator Team Pager [phone] (8a-5p)

## 2024-02-07 ENCOUNTER — Telehealth: Payer: Self-pay | Admitting: *Deleted

## 2024-02-07 ENCOUNTER — Telehealth: Payer: Self-pay

## 2024-02-07 ENCOUNTER — Other Ambulatory Visit (HOSPITAL_COMMUNITY): Payer: Self-pay

## 2024-02-07 ENCOUNTER — Other Ambulatory Visit: Payer: Self-pay | Admitting: Nurse Practitioner

## 2024-02-07 ENCOUNTER — Other Ambulatory Visit: Payer: Self-pay

## 2024-02-07 ENCOUNTER — Telehealth: Payer: Self-pay | Admitting: Nurse Practitioner

## 2024-02-07 DIAGNOSIS — G4700 Insomnia, unspecified: Secondary | ICD-10-CM | POA: Diagnosis not present

## 2024-02-07 DIAGNOSIS — J449 Chronic obstructive pulmonary disease, unspecified: Secondary | ICD-10-CM | POA: Diagnosis not present

## 2024-02-07 DIAGNOSIS — Z794 Long term (current) use of insulin: Secondary | ICD-10-CM

## 2024-02-07 DIAGNOSIS — E119 Type 2 diabetes mellitus without complications: Secondary | ICD-10-CM

## 2024-02-07 DIAGNOSIS — N179 Acute kidney failure, unspecified: Secondary | ICD-10-CM | POA: Diagnosis not present

## 2024-02-07 DIAGNOSIS — I5033 Acute on chronic diastolic (congestive) heart failure: Secondary | ICD-10-CM | POA: Diagnosis not present

## 2024-02-07 LAB — BASIC METABOLIC PANEL WITH GFR
Anion gap: 13 (ref 5–15)
BUN: 32 mg/dL — ABNORMAL HIGH (ref 6–20)
CO2: 22 mmol/L (ref 22–32)
Calcium: 9 mg/dL (ref 8.9–10.3)
Chloride: 103 mmol/L (ref 98–111)
Creatinine, Ser: 1.43 mg/dL — ABNORMAL HIGH (ref 0.44–1.00)
GFR, Estimated: 46 mL/min — ABNORMAL LOW (ref >60)
Glucose, Bld: 308 mg/dL — ABNORMAL HIGH (ref 70–99)
Potassium: 4 mmol/L (ref 3.5–5.1)
Sodium: 138 mmol/L (ref 135–145)

## 2024-02-07 LAB — GLUCOSE, CAPILLARY
Glucose-Capillary: 286 mg/dL — ABNORMAL HIGH (ref 70–99)
Glucose-Capillary: 330 mg/dL — ABNORMAL HIGH (ref 70–99)

## 2024-02-07 LAB — MAGNESIUM: Magnesium: 2.2 mg/dL (ref 1.7–2.4)

## 2024-02-07 MED ORDER — POLYETHYLENE GLYCOL 3350 17 G PO PACK
17.0000 g | PACK | Freq: Every day | ORAL | Status: DC
  Administered 2024-02-07: 17 g via ORAL
  Filled 2024-02-07: qty 1

## 2024-02-07 MED ORDER — PREGABALIN 100 MG PO CAPS
100.0000 mg | ORAL_CAPSULE | Freq: Three times a day (TID) | ORAL | 0 refills | Status: DC
  Filled 2024-02-07: qty 90, 30d supply, fill #0

## 2024-02-07 MED ORDER — MELATONIN 10 MG PO TABS
10.0000 mg | ORAL_TABLET | Freq: Every evening | ORAL | 0 refills | Status: AC | PRN
  Filled 2024-02-07: qty 30, 30d supply, fill #0
  Filled 2024-02-07: qty 30, fill #0
  Filled 2024-02-07: qty 30, 30d supply, fill #0
  Filled 2024-02-13: qty 30, fill #0
  Filled 2024-02-14 – 2024-02-19 (×2): qty 30, 30d supply, fill #0

## 2024-02-07 MED ORDER — SENNA 8.6 MG PO TABS
1.0000 | ORAL_TABLET | Freq: Every day | ORAL | Status: DC
  Administered 2024-02-07: 8.6 mg via ORAL
  Filled 2024-02-07: qty 1

## 2024-02-07 NOTE — Assessment & Plan Note (Signed)
-   part of her difficulty sleeping may be due to underlying sleep apnea and she may benefit from CPAP but needs sleep study which she is pursuing -Have also cautioned her in trying to not continue to escalate her medication regimen to encompass multiple sedating agents; she is already on oxycodone  and Lyrica , and asked for increased dose of Lyrica  as noted under chronic pain.  We also used melatonin in the hospital to help her sleep and finally gave her a dose of Ambien ; she is requesting more Ambien  at discharge; have told her she needs to discuss this with her primary care first and no further Ambien  will be given at discharge given multiple sedating agents already in play -Did send prescription for melatonin per her request but she understands to discuss Ambien  or other sleep agents with primary care

## 2024-02-07 NOTE — Telephone Encounter (Signed)
 Copied from CRM [id number]. Topic: Clinical - Medication Refill >> Feb 07, 2024  1:39 PM Santiya F wrote: Most Recent Primary Care Visit:  Provider: [name]  Department: [facility]  Visit Type: HOSPITAL FOLLOW UP  Date: 12/21/2023  Medication: insulin  lispro protamine-lispro (HUMALOG  75/25 MIX) (75-25) 100 UNIT/ML SUSP injection [[id number]]  Has the patient contacted their pharmacy? Yes  (Agent: If yes, when and what did the pharmacy advise?) contact office   Is this the correct pharmacy for this prescription? Yes  This is the patient's preferred pharmacy:  [facility] [address] [place] [address] Phone: [phone] Fax: [phone]   Has the prescription been filled recently? Yes  Is the patient out of the medication? No  Has the patient been seen for an appointment in the last year OR does the patient have an upcoming appointment? Yes  Can we respond through MyChart? Yes  Agent: Please be advised that Rx refills may take up to 3 business days. We ask that you follow-up with your pharmacy.

## 2024-02-07 NOTE — Telephone Encounter (Signed)
 Copied from CRM [id number]. Topic: Clinical - Medical Advice >> Feb 06, 2024 11:58 AM Isabell A wrote: Reason for CRM: Patient states she's currently in the hospital, states she has been experiencing anxiety and that's why she's having sleep issues. Patient would like to speak with a nurse to discuss this.   Callback number: [phone]  I called the pt and there was no answer- LMTCB

## 2024-02-07 NOTE — Telephone Encounter (Signed)
 Pt is currently admitted to hospital. Will forward to PCP for review.   Copied from CRM [id number]. Topic: Clinical - Medication Refill >> Feb 07, 2024 11:50 AM Ivette P wrote: Most Recent Primary Care Visit:  Provider: [name]  Department: [facility]  Visit Type: HOSPITAL FOLLOW UP  Date: 12/21/2023  Medication: Ambien  5 mg - sleeping Pill   Has the patient contacted their pharmacy? No (Agent: If no, request that the patient contact the pharmacy for the refill. If patient does not wish to contact the pharmacy document the reason why and proceed with request.) (Agent: If yes, when and what did the pharmacy advise?)  Is this the correct pharmacy for this prescription? Yes If no, delete pharmacy and type the correct one.  This is the patient's preferred pharmacy:  [facility] [address] [place] [address] Phone: [phone] Fax: [phone]  [id number] [name], [place] - [address] AT [facility] [address] [place] [address] Phone: [phone] Fax: [phone]  [id number] - [name], [place] - [address] AT [facility] [name] [place] [address] Phone: [phone] Fax: [phone]  [name] [address] [place] [address] Phone: [phone] Fax: [phone]    Has the prescription been filled recently? No  Is the patient out of the medication? Yes  Has the patient been seen for an appointment in the last year OR does the patient have an upcoming appointment? Yes, 02/25/2024  Can we respond through MyChart? Yes  Agent: Please be advised that Rx refills may take up to 3 business days. We ask that you follow-up with your pharmacy.

## 2024-02-07 NOTE — Discharge Summary (Signed)
 Physician Discharge Summary   Jill Shaw GNF:[id number] DOB: [birth year] DOA: 02/03/2024  PCP: [name], NP  Admit date: 02/03/2024 Discharge date: 02/07/2024  Admitted From: Home Disposition:  Home Discharging physician: Faith Homes, MD Barriers to discharge: none  Recommendations at discharge: Defer further insomnia treatment to PCP; patient has been cautioned against escalating too many sedating/psychotropic agents Continue pursuing sleep study Repeat BMP May still need further BP regimen adjustment if BP still elevated at follow up May need further diet education or weight loss discussions   Discharge Condition: stable CODE STATUS: Full  Diet recommendation:  Diet Orders (From admission, onward)     Start     Ordered   02/07/24 0000  Diet Carb Modified        02/07/24 1151   02/04/24 0643  Diet Carb Modified Fluid consistency: Thin; Room service appropriate? Yes  Diet effective now       Question Answer Comment  Diet-HS Snack? Nothing   Calorie Level Medium 1600-2000   Fluid consistency: Thin   Room service appropriate? Yes      02/04/24 0642            Hospital Course: Jill Shaw is a 48 y.o. female with PMH DM, gastroparesis, chronic pain on daily opiates, HTN, COPD, dCHF, CKD IIIa, OHS who presented with diffuse pain, leg swelling, found to have Cr 1.7, peripheral edema, elevated BNP, 15lb weight gain.   She was admitted for CHF exacerbation treatment with diuresis.   Assessment and Plan: * Acute on chronic heart failure with preserved ejection fraction (HFpEF) (HCC) - diuresing on lasix  but some bump in creat now - voided well, net neg 4L  - suspect diet contributing at staff noticing patient ordering 2 meals at times - encouraged her for following low-sodium diet - resume home lasix  dose - repeat BMP at follow up    Acute renal failure superimposed on stage 3a chronic kidney disease (HCC) AKI on chronic kidney disease stage IIIa Cr 1.6  on admission due to congestion.  - improvement on lasix , now some bump - lasix  decreased and creat improved to 1.43 at discharge - repeat BMP at follow up  Chronic pain syndrome Patient has chronic back pain from a vertebral compression fracture, and chronic knee arthritis and neuropathy.  We had a long discussion about this.  Risks of titrating oxycodone  were discussed - Continue home oxycodone  - Continue Lyrica , tizanidine ; she asked for higher lyrica  dose, this was given on admission and she says did help; cautioned her on increasing meds for no reason, but for now will send home on lyrica  100mg  TID and defer further refills to her PCP - Outpatient PSG needed - Outpatient follow up with pain medicine recommended  Insomnia - part of her difficulty sleeping may be due to underlying sleep apnea and she may benefit from CPAP but needs sleep study which she is pursuing -Have also cautioned her in trying to not continue to escalate her medication regimen to encompass multiple sedating agents; she is already on oxycodone  and Lyrica , and asked for increased dose of Lyrica  as noted under chronic pain.  We also used melatonin in the hospital to help her sleep and finally gave her a dose of Ambien ; she is requesting more Ambien  at discharge; have told her she needs to discuss this with her primary care first and no further Ambien  will be given at discharge given multiple sedating agents already in play -Did send prescription for melatonin per her  request but she understands to discuss Ambien  or other sleep agents with primary care  Obesity hypoventilation syndrome [facility]) Patient with trifecta of OHS, COPD (FEV1 61% in 2020) and probable OSA (needs inpatient sleep study, which is pending).  This is exacerbated by chronic oxycodone  use.  No active COPD flare. - Strongly encouraged PSG - Strongly discouraged escalating opiates  Mixed hyperlipidemia - Continue Crestor   Type 2 diabetes mellitus (HCC) -  continue home regimen  Diabetic gastroparesis (HCC) Relation between opiates and gastroparesis discussed  Chronic obstructive pulmonary disease (HCC) - Continue ICS/LABA  Obesity, Class III, BMI 40-49.9 (morbid obesity) (HCC) BMI 48, complicates care  Essential hypertension - elevated and uncontrolled on admission  - resume home regimen   The patient's acute and chronic medical conditions were treated accordingly. On day of discharge, patient was felt deemed stable for discharge. Patient/family member advised to call PCP or come back to ER if needed.   Principal Diagnosis: Acute on chronic heart failure with preserved ejection fraction (HFpEF) (HCC)  Discharge Diagnoses: Active Hospital Problems   Diagnosis Date Noted   Acute on chronic heart failure with preserved ejection fraction (HFpEF) (HCC) 06/22/2020    Priority: 1.   Acute renal failure superimposed on stage 3a chronic kidney disease (HCC) 03/18/2023    Priority: 3.   Chronic pain syndrome 05/13/2012    Priority: 4.   Insomnia 02/07/2024    Priority: 5.   Obesity hypoventilation syndrome (HCC) 02/05/2024    Priority: 6.   Hypertensive urgency 03/18/2023   Mixed hyperlipidemia 08/15/2021   Type 2 diabetes mellitus (HCC) 02/16/2021   Chronic obstructive pulmonary disease (HCC) 02/05/2019   Essential hypertension 03/27/2016   Obesity, Class III, BMI 40-49.9 (morbid obesity) (HCC) 03/27/2016   Diabetic gastroparesis (HCC) 11/06/2006    Resolved Hospital Problems  No resolved problems to display.     Discharge Instructions     Diet Carb Modified   Complete by: As directed    Increase activity slowly   Complete by: As directed       Allergies as of 02/07/2024       Reactions   Elavil  [amitriptyline ] Other (See Comments)   Coma   Orudis [ketoprofen] Nausea And Vomiting   Desyrel  [trazodone ] Nausea And Vomiting   Tylenol  [acetaminophen ] Nausea And Vomiting   Aspirin  Nausea Only   Motrin  [ibuprofen ] Nausea  And Vomiting   Naprosyn [naproxen] Nausea And Vomiting   Neurontin [gabapentin] Nausea And Vomiting, Other (See Comments)   upset stomach   Sulfa Antibiotics Nausea And Vomiting   Ultram  [tramadol ] Nausea And Vomiting, Other (See Comments)   stomach upset   Victoza [liraglutide] Nausea And Vomiting   Zegerid [omeprazole -sodium Bicarbonate ] Nausea And Vomiting        Medication List     TAKE these medications    Accu-Chek Guide test strip Generic drug: glucose blood USE TO TEST BLOOD GLUCOSE AS DIRECTED UP TO 4 TIMES DAILY   Accu-Chek Softclix Lancets lancets USE TO TEST AS DIRECTED UP TO FOUR TIMES DAILY.   albuterol  108 (90 Base) MCG/ACT inhaler Commonly known as: VENTOLIN  HFA Inhale 2 puffs into the lungs every 6 (six) hours as needed for wheezing or shortness of breath.   albuterol  (2.5 MG/3ML) 0.083% nebulizer solution Commonly known as: PROVENTIL  Take 3 mLs (2.5 mg total) by nebulization every 6 (six) hours as needed for wheezing or shortness of breath.   B-D ULTRAFINE III SHORT PEN 31G X 8 MM Misc Generic drug: Insulin  Pen Needle  USE AS DIRECTED EVERY MORNING, NOON, EVENING, AND AT BEDTIME   budesonide -formoterol  160-4.5 MCG/ACT inhaler Commonly known as: Symbicort  Inhale 2 puffs into the lungs 2 (two) times daily.   Dexlansoprazole  30 MG capsule DR TAKE 1 CAPSULE (30 MG TOTAL) BY MOUTH DAILY. NEEDS OFFICE VISIT FOR ADDITIONAL REFILLS   dicyclomine  20 MG tablet Commonly known as: BENTYL  Take 1 tablet (20 mg total) by mouth 2 (two) times daily. What changed:  when to take this reasons to take this   empagliflozin  10 MG Tabs tablet Commonly known as: JARDIANCE  Take 1 tablet (10 mg total) by mouth daily.   FreeStyle Libre 3 Sensor Misc Place 1 sensor on the skin every 14 days. Use to check glucose continuously   furosemide  40 MG tablet Commonly known as: LASIX  Take 1 tablet (40 mg total) by mouth daily.   hydrALAZINE  25 MG tablet Commonly known as:  APRESOLINE  Take 1 tablet (25 mg total) by mouth 3 (three) times daily.   insulin  lispro protamine-lispro (75-25) 100 UNIT/ML Susp injection Commonly known as: HUMALOG  75/25 MIX Inject 60 Units into the skin in the morning, at noon, and at bedtime.   Melatonin 10 MG Tabs Take 10 mg by mouth at bedtime as needed (insomnia). What changed:  medication strength how much to take when to take this reasons to take this   metoCLOPramide  10 MG tablet Commonly known as: REGLAN  Take 1 tablet (10 mg total) by mouth every 8 (eight) hours as needed for nausea.   metoprolol  succinate 100 MG 24 hr tablet Commonly known as: TOPROL -XL Take 1 tablet (100 mg total) by mouth daily. Take with or immediately following a meal.   naloxone  4 MG/0.1ML Liqd nasal spray kit Commonly known as: NARCAN  Insert 1 spray into one nostril if poorly responding / turning blue. CALL 911 ASAP   nitroGLYCERIN  0.4 MG SL tablet Commonly known as: Nitrostat  Place 1 tablet (0.4 mg total) under the tongue every 5 (five) minutes as needed for chest pain. Additional refills to be filled by PCP, patient aware   norethindrone  5 MG tablet Commonly known as: AYGESTIN  Take 1 tablet (5 mg total) by mouth 2 (two) times daily as needed (bleeding).   ondansetron  4 MG disintegrating tablet Commonly known as: ZOFRAN -ODT Take 1 tablet (4 mg total) by mouth every 8 (eight) hours as needed for nausea or vomiting.   Oxycodone  HCl 10 MG Tabs Take 1 tablet (10 mg total) by mouth in the morning, at noon, in the evening, and at bedtime if needed for pain   pantoprazole  20 MG tablet Commonly known as: PROTONIX  Take 1 tablet (20 mg total) by mouth daily.   Pimozide 1 MG Tabs Take 1 mg by mouth daily as needed (feeling of facial itching).   potassium chloride  SA 20 MEQ tablet Commonly known as: KLOR-CON  M Take 1 tablet (20 mEq total) by mouth daily.   pregabalin  100 MG capsule Commonly known as: LYRICA  Take 1 capsule (100 mg total)  by mouth 3 (three) times daily. What changed:  medication strength how much to take when to take this   rosuvastatin  20 MG tablet Commonly known as: CRESTOR  TAKE 1 TABLET(20 MG) BY MOUTH DAILY   spironolactone  25 MG tablet Commonly known as: ALDACTONE  Take 1 tablet (25 mg total) by mouth daily.   sucralfate  1 g tablet Commonly known as: Carafate  Take 1 tablet (1 g total) by mouth 4 (four) times daily -  with meals and at bedtime.   tiZANidine  4  MG tablet Commonly known as: Zanaflex  Take 1 tablet (4 mg total) by mouth every 8 (eight) hours as needed for muscle spasms.   Toujeo  Max SoloStar 300 UNIT/ML Solostar Pen Generic drug: insulin  glargine (2 Unit Dial ) Inject 60 Units into the skin in the morning. What changed: how much to take        Allergies  Allergen Reactions   Elavil  [Amitriptyline ] Other (See Comments)    Coma   Orudis [Ketoprofen] Nausea And Vomiting   Desyrel  [Trazodone ] Nausea And Vomiting   Tylenol  [Acetaminophen ] Nausea And Vomiting   Aspirin  Nausea Only   Motrin  [Ibuprofen ] Nausea And Vomiting   Naprosyn [Naproxen] Nausea And Vomiting   Neurontin [Gabapentin] Nausea And Vomiting and Other (See Comments)    upset stomach   Sulfa Antibiotics Nausea And Vomiting   Ultram  [Tramadol ] Nausea And Vomiting and Other (See Comments)    stomach upset   Victoza [Liraglutide] Nausea And Vomiting   Zegerid [Omeprazole -Sodium Bicarbonate ] Nausea And Vomiting    Consultations:   Procedures:   Discharge Exam: BP (!) 146/82 (BP Location: Right Wrist)   Pulse 80   Temp 98 F (36.7 C) (Oral)   Resp 20   Ht 5\' 4"  (1.626 m)   Wt 127.6 kg   SpO2 96%   BMI 48.29 kg/m  Physical Exam Constitutional:      Appearance: Normal appearance. She is obese.  HENT:     Head: Normocephalic and atraumatic.     Mouth/Throat:     Mouth: Mucous membranes are moist.  Eyes:     Extraocular Movements: Extraocular movements intact.  Cardiovascular:     Rate and  Rhythm: Normal rate and regular rhythm.  Pulmonary:     Effort: Pulmonary effort is normal. No respiratory distress.     Breath sounds: Normal breath sounds. No wheezing.  Abdominal:     General: Bowel sounds are normal. There is no distension.     Palpations: Abdomen is soft.     Tenderness: There is no abdominal tenderness.  Musculoskeletal:        General: Normal range of motion.     Cervical back: Normal range of motion and neck supple.     Right lower leg: Edema (2+) present.     Left lower leg: Edema (2+) present.  Skin:    General: Skin is warm and dry.  Neurological:     General: No focal deficit present.     Mental Status: She is alert.  Psychiatric:        Mood and Affect: Mood normal.      The results of significant diagnostics from this hospitalization (including imaging, microbiology, ancillary and laboratory) are listed below for reference.   Microbiology: Recent Results (from the past 240 hours)  Resp panel by RT-PCR (RSV, Flu A&B, Covid) Anterior Nasal Swab     Status: None   Collection Time: 02/03/24  5:44 PM   Specimen: Anterior Nasal Swab  Result Value Ref Range Status   SARS Coronavirus 2 by RT PCR NEGATIVE NEGATIVE Final   Influenza A by PCR NEGATIVE NEGATIVE Final   Influenza B by PCR NEGATIVE NEGATIVE Final    Comment: (NOTE) The Xpert Xpress SARS-CoV-2/FLU/RSV plus assay is intended as an aid in the diagnosis of influenza from Nasopharyngeal swab specimens and should not be used as a sole basis for treatment. Nasal washings and aspirates are unacceptable for Xpert Xpress SARS-CoV-2/FLU/RSV testing.  Fact Sheet for Patients: BloggerCourse.com  Fact Sheet for Healthcare Providers:  SeriousBroker.it  This test is not yet approved or cleared by the [place]  FDA and has been authorized for detection and/or diagnosis of SARS-CoV-2 by FDA under an Emergency Use Authorization (EUA). This EUA will  remain in effect (meaning this test can be used) for the duration of the COVID-19 declaration under Section 564(b)(1) of the Act, 21 U.S.C. section 360bbb-3(b)(1), unless the authorization is terminated or revoked.     Resp Syncytial Virus by PCR NEGATIVE NEGATIVE Final    Comment: (NOTE) Fact Sheet for Patients: BloggerCourse.com  Fact Sheet for Healthcare Providers: SeriousBroker.it  This test is not yet approved or cleared by the [place]  FDA and has been authorized for detection and/or diagnosis of SARS-CoV-2 by FDA under an Emergency Use Authorization (EUA). This EUA will remain in effect (meaning this test can be used) for the duration of the COVID-19 declaration under Section 564(b)(1) of the Act, 21 U.S.C. section 360bbb-3(b)(1), unless the authorization is terminated or revoked.  Performed at [facility], [address], [place], [place] [address]      Labs: BNP (last 3 results) Recent Labs    12/01/23 1308 02/02/24 1556 02/04/24 0239  BNP 61.4 255.7* 178.4*   Basic Metabolic Panel: Recent Labs  Lab 02/02/24 1556 02/04/24 0239 02/05/24 1037 02/06/24 0548 02/07/24 0503  NA 143 140 141 140 138  K 4.1 4.6 5.0 4.3 4.0  CL 110 110 111 103 103  CO2 22 20* 21* 26 22  GLUCOSE 63* 203* 190* 249* 308*  BUN 15 13 17  26* 32*  CREATININE 1.38* 1.75* 1.50* 1.69* 1.43*  CALCIUM  9.5 9.1 9.2 9.4 9.0  MG  --   --   --   --  2.2   Liver Function Tests: Recent Labs  Lab 02/02/24 1556  AST 21  ALT 9  ALKPHOS 85  BILITOT 0.5  PROT 6.7  ALBUMIN 3.8   No results for input(s): "LIPASE", "AMYLASE" in the last 168 hours. No results for input(s): "AMMONIA" in the last 168 hours. CBC: Recent Labs  Lab 02/02/24 1556 02/04/24 0239 02/06/24 0548  WBC 12.3* 11.1* 9.1  NEUTROABS  --  6.6  --   HGB 13.7 13.5 13.4  HCT 40.4 41.1 41.1  MCV 86.1 87.4 85.6  PLT 221 233 227   Cardiac Enzymes: Recent Labs   Lab 02/04/24 0239  CKTOTAL 146   BNP: Invalid input(s): "POCBNP" CBG: Recent Labs  Lab 02/06/24 1152 02/06/24 1631 02/06/24 2115 02/07/24 0746 02/07/24 1137  GLUCAP 238* 267* 228* 286* 330*   D-Dimer No results for input(s): "DDIMER" in the last 72 hours. Hgb A1c No results for input(s): "HGBA1C" in the last 72 hours. Lipid Profile No results for input(s): "CHOL", "HDL", "LDLCALC", "TRIG", "CHOLHDL", "LDLDIRECT" in the last 72 hours. Thyroid  function studies No results for input(s): "TSH", "T4TOTAL", "T3FREE", "THYROIDAB" in the last 72 hours.  Invalid input(s): "FREET3" Anemia work up No results for input(s): "VITAMINB12", "FOLATE", "FERRITIN", "TIBC", "IRON ", "RETICCTPCT" in the last 72 hours. Urinalysis    Component Value Date/Time   COLORURINE COLORLESS (A) 02/04/2024 0906   APPEARANCEUR CLEAR 02/04/2024 0906   LABSPEC 1.011 02/04/2024 0906   PHURINE 5.0 02/04/2024 0906   GLUCOSEU 50 (A) 02/04/2024 0906   HGBUR NEGATIVE 02/04/2024 0906   BILIRUBINUR NEGATIVE 02/04/2024 0906   BILIRUBINUR negative 11/26/2023 1206   BILIRUBINUR NEGATIVE 11/05/2019 1342   KETONESUR NEGATIVE 02/04/2024 0906   PROTEINUR NEGATIVE 02/04/2024 0906   UROBILINOGEN 0.2 11/26/2023 1206   UROBILINOGEN  0.2 11/12/2020 1148   NITRITE NEGATIVE 02/04/2024 0906   LEUKOCYTESUR NEGATIVE 02/04/2024 0906   Sepsis Labs Recent Labs  Lab 02/02/24 1556 02/04/24 0239 02/06/24 0548  WBC 12.3* 11.1* 9.1   Microbiology Recent Results (from the past 240 hours)  Resp panel by RT-PCR (RSV, Flu A&B, Covid) Anterior Nasal Swab     Status: None   Collection Time: 02/03/24  5:44 PM   Specimen: Anterior Nasal Swab  Result Value Ref Range Status   SARS Coronavirus 2 by RT PCR NEGATIVE NEGATIVE Final   Influenza A by PCR NEGATIVE NEGATIVE Final   Influenza B by PCR NEGATIVE NEGATIVE Final    Comment: (NOTE) The Xpert Xpress SARS-CoV-2/FLU/RSV plus assay is intended as an aid in the diagnosis of  influenza from Nasopharyngeal swab specimens and should not be used as a sole basis for treatment. Nasal washings and aspirates are unacceptable for Xpert Xpress SARS-CoV-2/FLU/RSV testing.  Fact Sheet for Patients: BloggerCourse.com  Fact Sheet for Healthcare Providers: SeriousBroker.it  This test is not yet approved or cleared by the [place]  FDA and has been authorized for detection and/or diagnosis of SARS-CoV-2 by FDA under an Emergency Use Authorization (EUA). This EUA will remain in effect (meaning this test can be used) for the duration of the COVID-19 declaration under Section 564(b)(1) of the Act, 21 U.S.C. section 360bbb-3(b)(1), unless the authorization is terminated or revoked.     Resp Syncytial Virus by PCR NEGATIVE NEGATIVE Final    Comment: (NOTE) Fact Sheet for Patients: BloggerCourse.com  Fact Sheet for Healthcare Providers: SeriousBroker.it  This test is not yet approved or cleared by the [place]  FDA and has been authorized for detection and/or diagnosis of SARS-CoV-2 by FDA under an Emergency Use Authorization (EUA). This EUA will remain in effect (meaning this test can be used) for the duration of the COVID-19 declaration under Section 564(b)(1) of the Act, 21 U.S.C. section 360bbb-3(b)(1), unless the authorization is terminated or revoked.  Performed at [facility], [address] [place], [place] [address]     Procedures/Studies: ECHOCARDIOGRAM COMPLETE Result Date: 02/04/2024    ECHOCARDIOGRAM REPORT   Patient Name:   Jill Shaw Date of Exam: 02/04/2024 Medical Rec #:  [id number]       Height:       64.0 in Accession #:    [id number]      Weight:       270.1 lb Date of Birth:  1976/05/06       BSA:          2.223 m Patient Age:    48 years        BP:           176/107 mmHg Patient Gender: F               HR:           68 bpm. Exam  Location:  Inpatient Procedure: 2D Echo, Cardiac Doppler, Color Doppler and Intracardiac            Opacification Agent (Both Spectral and Color Flow Doppler were            utilized during procedure). Indications:    CHF-Acute Diastolic  History:        Patient has prior history of Echocardiogram examinations.                 Signs/Symptoms:Chest Pain and Shortness of Breath; Risk  Factors:Hypertension, Current Smoker, Diabetes and HLD.  Sonographer:    Willey Harrier Referring Phys: [id number] ALLISON L ELLIS  Sonographer Comments: Patient is obese and Technically difficult study due to poor echo windows. IMPRESSIONS  1. Left ventricular ejection fraction, by estimation, is 60 to 65%. The left ventricle has normal function. The left ventricle has no regional wall motion abnormalities. There is moderate concentric left ventricular hypertrophy. Left ventricular diastolic parameters are consistent with Grade II diastolic dysfunction (pseudonormalization).  2. Right ventricular systolic function is normal. The right ventricular size is normal. Tricuspid regurgitation signal is inadequate for assessing PA pressure.  3. The mitral valve is normal in structure. Trivial mitral valve regurgitation. No evidence of mitral stenosis.  4. The aortic valve is normal in structure. Aortic valve regurgitation is not visualized. No aortic stenosis is present. FINDINGS  Left Ventricle: Left ventricular ejection fraction, by estimation, is 60 to 65%. The left ventricle has normal function. The left ventricle has no regional wall motion abnormalities. The left ventricular internal cavity size was normal in size. There is  moderate concentric left ventricular hypertrophy. Left ventricular diastolic parameters are consistent with Grade II diastolic dysfunction (pseudonormalization). Right Ventricle: The right ventricular size is normal. No increase in right ventricular wall thickness. Right ventricular systolic function is  normal. Tricuspid regurgitation signal is inadequate for assessing PA pressure. Left Atrium: Left atrial size was normal in size. Right Atrium: Right atrial size was normal in size. Pericardium: There is no evidence of pericardial effusion. Mitral Valve: The mitral valve is normal in structure. Trivial mitral valve regurgitation. No evidence of mitral valve stenosis. MV peak gradient, 3.0 mmHg. The mean mitral valve gradient is 2.0 mmHg. Tricuspid Valve: The tricuspid valve is normal in structure. Tricuspid valve regurgitation is trivial. No evidence of tricuspid stenosis. Aortic Valve: The aortic valve is normal in structure. Aortic valve regurgitation is not visualized. No aortic stenosis is present. Aortic valve peak gradient measures 8.4 mmHg. Pulmonic Valve: The pulmonic valve was normal in structure. Pulmonic valve regurgitation is not visualized. No evidence of pulmonic stenosis. Aorta: The aortic root is normal in size and structure. Venous: The inferior vena cava was not well visualized. IAS/Shunts: No atrial level shunt detected by color flow Doppler.  LEFT VENTRICLE PLAX 2D LVIDd:         5.00 cm   Diastology LVIDs:         3.10 cm   LV e' medial:    6.09 cm/s LV PW:         1.30 cm   LV E/e' medial:  13.9 LV IVS:        1.30 cm   LV e' lateral:   6.09 cm/s LVOT diam:     2.00 cm   LV E/e' lateral: 13.9 LV SV:         73 LV SV Index:   33 LVOT Area:     3.14 cm  LEFT ATRIUM             Index LA Vol (A2C):   59.1 ml 26.59 ml/m LA Vol (A4C):   65.9 ml 29.65 ml/m LA Biplane Vol: 63.0 ml 28.34 ml/m  AORTIC VALVE AV Area (Vmax): 2.40 cm AV Vmax:        145.00 cm/s AV Peak Grad:   8.4 mmHg LVOT Vmax:      111.00 cm/s LVOT Vmean:     67.700 cm/s LVOT VTI:       0.231 m  AORTA Ao  Root diam: 3.00 cm Ao Asc diam:  3.20 cm MITRAL VALVE MV Area (PHT): 2.87 cm    SHUNTS MV Area VTI:   2.63 cm    Systemic VTI:  0.23 m MV Peak grad:  3.0 mmHg    Systemic Diam: 2.00 cm MV Mean grad:  2.0 mmHg MV Vmax:       0.87  m/s MV Vmean:      59.7 cm/s MV Decel Time: 264 msec MV E velocity: 84.90 cm/s MV A velocity: 83.70 cm/s MV E/A ratio:  1.01 Jules Oar MD Electronically signed by Jules Oar MD Signature Date/Time: 02/04/2024/10:41:22 AM    Final    CT Angio Chest PE W and/or Wo Contrast Result Date: 02/04/2024 CLINICAL DATA:  48 year old female with sudden severe headache, shortness of breath. EXAM: CT ANGIOGRAPHY CHEST WITH CONTRAST TECHNIQUE: Multidetector CT imaging of the chest was performed using the standard protocol during bolus administration of intravenous contrast. Multiplanar CT image reconstructions and MIPs were obtained to evaluate the vascular anatomy. RADIATION DOSE REDUCTION: This exam was performed according to the departmental dose-optimization program which includes automated exposure control, adjustment of the mA and/or kV according to patient size and/or use of iterative reconstruction technique. CONTRAST:  75mL OMNIPAQUE  IOHEXOL  350 MG/ML SOLN COMPARISON:  Chest CTA 01/17/2023. Chest radiographs today. FINDINGS: Cardiovascular: Good contrast bolus timing in the pulmonary arterial tree. Little respiratory motion. No pulmonary artery filling defect. Little contrast in the aorta. Cardiomegaly (series 8, image 259). No pericardial effusion. No definite calcified coronary artery plaque. Mediastinum/Nodes: Stable and negative. Lungs/Pleura: Major airways are patent. Stable lung volumes. Unchanged curvilinear scarring or atelectasis in the bilateral middle lobes, along the right minor fissure. Right middle lobe lung nodule on series 7, image 74 and right upper lobe lung nodule on image 42 are both stable since 12/24/2020 and benign (no follow-up imaging recommended). No pleural effusion, consolidation, or acute lung opacity. Upper Abdomen: Stable and negative visible mostly noncontrast liver, gallbladder, spleen, pancreas, adrenal glands, kidneys and bowel in the upper abdomen. Musculoskeletal: Stable  visualized osseous structures. Advanced mid and lower thoracic disc degeneration with vacuum disc. Mild L1 superior endplate compression fracture is stable. Review of the MIP images confirms the above findings. IMPRESSION: 1. Negative for acute pulmonary embolus. 2. Cardiomegaly.  Chronic middle lobe lung scarring. 3. No acute or inflammatory process identified in the Chest. Electronically Signed   By: Marlise Simpers M.D.   On: 02/04/2024 04:33   DG Chest 2 View Result Date: 02/04/2024 CLINICAL DATA:  48 year old female with sudden severe headache, shortness of breath. EXAM: CHEST - 2 VIEW COMPARISON:  Chest radiographs 02/02/2024 and earlier. FINDINGS: Semi upright AP and lateral views at 0333 hours. Chronic linear scarring at the level of the right minor fissure in both lungs is stable, was present on chest CT last year. Stable borderline to mild cardiomegaly. Other mediastinal contours are within normal limits. Visualized tracheal air column is within normal limits. No pneumothorax, pulmonary edema, pleural effusion or acute lung opacity. No acute osseous abnormality identified. Paucity of bowel gas. IMPRESSION: No acute cardiopulmonary abnormality. Electronically Signed   By: Marlise Simpers M.D.   On: 02/04/2024 04:27   CT Head Wo Contrast Result Date: 02/04/2024 CLINICAL DATA:  48 year old female with sudden severe headache, shortness of breath. EXAM: CT HEAD WITHOUT CONTRAST TECHNIQUE: Contiguous axial images were obtained from the base of the skull through the vertex without intravenous contrast. RADIATION DOSE REDUCTION: This exam was performed according to the  departmental dose-optimization program which includes automated exposure control, adjustment of the mA and/or kV according to patient size and/or use of iterative reconstruction technique. COMPARISON:  Brain MRI 01/30/2020.  Head CT 07/27/2023. FINDINGS: Brain: Stable cerebral volume. No midline shift, ventriculomegaly, mass effect, evidence of mass lesion,  intracranial hemorrhage or evidence of cortically based acute infarction. Gray-white matter differentiation is within normal limits throughout the brain. Vascular: No suspicious intracranial vascular hyperdensity. Mild Calcified atherosclerosis at the skull base. Skull: Intact.  No acute osseous abnormality identified. Sinuses/Orbits: Visualized paranasal sinuses and mastoids are stable and well aerated. Other: Visualized orbits and scalp soft tissues are stable. IMPRESSION: Stable and negative noncontrast Head CT. Electronically Signed   By: Marlise Simpers M.D.   On: 02/04/2024 04:26   DG Chest 2 View Result Date: 02/02/2024 CLINICAL DATA:  sob EXAM: CHEST - 2 VIEW COMPARISON:  01/08/2024 FINDINGS: Scattered bibasilar interstitial opacities slightly increased from previous. Heart size and mediastinal contours are within normal limits. No effusion. Visualized bones unremarkable. IMPRESSION: Slightly increased bibasilar interstitial opacities. Electronically Signed   By: Nicoletta Barrier M.D.   On: 02/02/2024 18:39   DG Chest 2 View Result Date: 01/08/2024 CLINICAL DATA:  Chest pain EXAM: CHEST - 2 VIEW COMPARISON:  12/01/2023 FINDINGS: Cardiac shadow is stable. Lungs are well aerated bilaterally. Linear scarring is noted in the bases bilaterally. No sizable effusion is seen. No bony abnormality is noted. IMPRESSION: No acute abnormality seen. Electronically Signed   By: Violeta Grey M.D.   On: 01/08/2024 22:42     Time coordinating discharge: Over 30 minutes    Faith Homes, MD  [facility] 02/07/2024, 4:51 PM

## 2024-02-07 NOTE — Telephone Encounter (Signed)
 Sent to provider. KH

## 2024-02-08 ENCOUNTER — Other Ambulatory Visit: Payer: Self-pay | Admitting: Nurse Practitioner

## 2024-02-08 ENCOUNTER — Telehealth: Payer: Self-pay | Admitting: Nurse Practitioner

## 2024-02-08 ENCOUNTER — Other Ambulatory Visit: Payer: Self-pay

## 2024-02-08 ENCOUNTER — Telehealth: Payer: Self-pay | Admitting: *Deleted

## 2024-02-08 DIAGNOSIS — Z794 Long term (current) use of insulin: Secondary | ICD-10-CM

## 2024-02-08 MED ORDER — HUMALOG KWIKPEN 200 UNIT/ML ~~LOC~~ SOPN
60.0000 [IU] | PEN_INJECTOR | Freq: Three times a day (TID) | SUBCUTANEOUS | 0 refills | Status: DC
  Filled 2024-02-08 – 2024-02-11 (×2): qty 60, 67d supply, fill #0

## 2024-02-08 MED ORDER — PREGABALIN 75 MG PO CAPS: 75.0000 mg | ORAL_CAPSULE | Freq: Two times a day (BID) | ORAL | 0 refills | Status: DC

## 2024-02-08 MED ORDER — DICLOFENAC SODIUM 1 % EX GEL: 2.0000 g | Freq: Four times a day (QID) | CUTANEOUS | 1 refills | Status: DC | PRN

## 2024-02-08 MED ORDER — OXYCODONE HCL 10 MG PO TABS
10.0000 mg | ORAL_TABLET | Freq: Four times a day (QID) | ORAL | 0 refills | Status: DC | PRN
  Filled 2024-02-11: qty 36, 9d supply, fill #0
  Filled ????-??-??: fill #0

## 2024-02-08 NOTE — Telephone Encounter (Signed)
 Please advise [facility]

## 2024-02-08 NOTE — Transitions of Care (Post Inpatient/ED Visit) (Signed)
   02/08/2024  Name: Jill Shaw MRN: [id number] DOB: 01/06/1976  Today's TOC FU Call Status: Today's TOC FU Call Status:: Unsuccessful Call (1st Attempt) Unsuccessful Call (1st Attempt) Date: 02/08/24  Attempted to reach the patient regarding the most recent Inpatient/ED visit.  Follow Up Plan: Additional outreach attempts will be made to reach the patient to complete the Transitions of Care (Post Inpatient/ED visit) call.   Cecilie Coffee RNC, BSN RN Care Manager/ Transition of Care [place]/ [facility] [phone]

## 2024-02-08 NOTE — Telephone Encounter (Signed)
 Patient was identified as falling into the [place] Measure - Diabetes.   Patient was: Appointment scheduled with primary care provider in the next 30 days.  Appt on 02/25/2024

## 2024-02-11 ENCOUNTER — Other Ambulatory Visit: Payer: Self-pay

## 2024-02-11 ENCOUNTER — Telehealth: Payer: Self-pay | Admitting: *Deleted

## 2024-02-11 NOTE — Transitions of Care (Post Inpatient/ED Visit) (Signed)
   02/11/2024  Name: Jill Shaw MRN: [id number] DOB: October 31, 1975  Today's TOC FU Call Status: Today's TOC FU Call Status:: Unsuccessful Call (2nd Attempt) Unsuccessful Call (2nd Attempt) Date: 02/11/24  Attempted to reach the patient regarding the most recent Inpatient/ED visit.  Follow Up Plan: Additional outreach attempts will be made to reach the patient to complete the Transitions of Care (Post Inpatient/ED visit) call.   Cecilie Coffee [facility], BSN RN Care Manager/ Transition of Care [place]/ [facility] [phone]

## 2024-02-12 ENCOUNTER — Telehealth: Payer: Self-pay

## 2024-02-12 NOTE — Transitions of Care (Post Inpatient/ED Visit) (Signed)
   02/12/2024  Name: BRAIDEN HENNIGH MRN: [id number] DOB: 1975/10/21  Today's TOC FU Call Status: Today's TOC FU Call Status:: Unsuccessful Call (3rd Attempt) Unsuccessful Call (3rd Attempt) Date: 02/12/24  Attempted to reach the patient regarding the most recent Inpatient/ED visit.  Follow Up Plan: No further outreach attempts will be made at this time. We have been unable to contact the patient.  Orpha Blade, RN, BSN, CEN [employer]- Transition of Care Team.  [facility] [phone]

## 2024-02-13 ENCOUNTER — Other Ambulatory Visit: Payer: Self-pay | Admitting: Nurse Practitioner

## 2024-02-13 NOTE — Telephone Encounter (Signed)
 Patient returned call on a CRM and requested a call back from nurse. She states while in the hospital she placed on Ambien  and Melatonin to help with sleep and she should have an in lab sleep study. She is requesting an rx for Ambien  sent to [facility] and an order to be placed for sleep study.

## 2024-02-13 NOTE — Telephone Encounter (Signed)
**Note De-identified  [place] Obfuscation** Please advise 

## 2024-02-14 ENCOUNTER — Emergency Department (HOSPITAL_COMMUNITY)
Admission: EM | Admit: 2024-02-14 | Discharge: 2024-02-14 | Discharge status: Against Medical Advice | Attending: Physical Medicine and Rehabilitation | Admitting: Physical Medicine and Rehabilitation

## 2024-02-14 ENCOUNTER — Other Ambulatory Visit: Payer: Self-pay

## 2024-02-14 ENCOUNTER — Telehealth: Payer: Self-pay

## 2024-02-14 ENCOUNTER — Encounter (HOSPITAL_COMMUNITY): Payer: Self-pay

## 2024-02-14 ENCOUNTER — Encounter: Payer: Medicare Other | Admitting: Physical Medicine and Rehabilitation

## 2024-02-14 ENCOUNTER — Institutional Professional Consult (permissible substitution) (HOSPITAL_BASED_OUTPATIENT_CLINIC_OR_DEPARTMENT_OTHER): Admitting: Family

## 2024-02-14 ENCOUNTER — Encounter: Payer: Self-pay | Admitting: Physical Medicine and Rehabilitation

## 2024-02-14 VITALS — BP 121/76 | HR 76 | Ht 64.0 in | Wt 284.2 lb

## 2024-02-14 DIAGNOSIS — G629 Polyneuropathy, unspecified: Secondary | ICD-10-CM | POA: Insufficient documentation

## 2024-02-14 DIAGNOSIS — E162 Hypoglycemia, unspecified: Secondary | ICD-10-CM | POA: Diagnosis present

## 2024-02-14 DIAGNOSIS — Z5321 Procedure and treatment not carried out due to patient leaving prior to being seen by health care provider: Secondary | ICD-10-CM | POA: Insufficient documentation

## 2024-02-14 LAB — CBG MONITORING, ED
Glucose-Capillary: 111 mg/dL — ABNORMAL HIGH (ref 70–99)
Glucose-Capillary: 164 mg/dL — ABNORMAL HIGH (ref 70–99)

## 2024-02-14 MED ORDER — CAPSAICIN-CLEANSING GEL 8 % EX KIT
4.0000 | PACK | Freq: Once | CUTANEOUS | Status: AC
  Administered 2024-02-14: 4 via TOPICAL

## 2024-02-14 NOTE — Progress Notes (Signed)
-  Discussed Qutenza as an option for neuropathic pain control. Discussed that this is a capsaicin patch, stronger than capsaicin cream. Discussed that it is currently approved for diabetic peripheral neuropathy and post-herpetic neuralgia, but that it has also shown benefit in treating other forms of neuropathy. Provided patient with link to site to learn more about the patch: https://www.clark.biz/. Discussed that the patch would be placed in office and benefits usually last 3 months. Discussed that unintended exposure to capsaicin can cause severe irritation of eyes, mucous membranes, respiratory tract, and skin, but that Qutenza is a local treatment and does not have the systemic side effects of other nerve medications. Discussed that there may be pain, itching, erythema, and decreased sensory function associated with the application of Qutenza. Side effects usually subside within 1 week. A cold pack of analgesic medications can help with these side effects. Blood pressure can also be increased due to pain associated with administration of the patch.   4 patches of Qutenza [phone]) was applied to the bilateral feet. Ice packs were applied during the procedure to ensure patient comfort. Blood pressure was monitored every 15 minutes. The patient tolerated the procedure well. Post-procedure instructions were given and follow-up has been scheduled.  Topical system measures 14cm x20cm (280cm for a total 1120units) were applied which will cause deeper penetration for destruction of the peripheral nerve using a chemical (Qutenza) which infuses into the skin like an injection and heat technique (occlusive, compressive dressing cauing endothermic heat technique)

## 2024-02-14 NOTE — ED Triage Notes (Signed)
 Pts mom found pt to be confused and was shaky. Hx of type 2. BS initially 40. Pt received oral glucose and ate a mega 3 musketeer candy bar and BS went up to 80. BS checked 10 minutes later nad BS dropped to 43. Pt arrives on D10% gtt.. Recently just left hospital for HTN crisis.

## 2024-02-14 NOTE — ED Provider Triage Note (Addendum)
 Emergency Medicine Provider Triage Evaluation Note  AMABELLA PETITHOMME , a 48 y.o. female  was evaluated in triage.  Pt complains of low blood sugar.  Review of Systems  Positive: Hypoglycemia Negative: Fevers chills other complaints  Physical Exam  BP 125/63   Pulse 69   Temp 98.1 F (36.7 C) (Oral)   Resp 17   Ht 1.626 m (5\' 4" )   Wt 122.5 kg   SpO2 96%   BMI 46.35 kg/m  Gen:   Awake, no distress   Resp:  Normal effort  MSK:   Moves extremities without difficulty  Other:    Medical Decision Making  Medically screening exam initiated at 1:32 PM.  Appropriate orders placed.  MAKENLI WOLVERTON was informed that the remainder of the evaluation will be completed by another provider, this initial triage assessment does not replace that evaluation, and the importance of remaining in the ED until their evaluation is complete.  Patient was brought to the ED for evaluation of low blood sugar.  Patient has history of type 2 diabetes.  Patient states she had a doctor's office morning.  She was resting out of the house and did not have breakfast.  Her blood sugar was found to be in the 40s.  She was given oral glucose and a candy bar.  Blood sugar improved to 80.  Blood sugar dropped back down again to 43.  She was placed on 10% dextrose  infusion.  Patient states she is feeling fine now.  She does not want to stay in the ED for evaluation.  I explained the patient that I was concerned her blood sugar might drop again.  I have turned off the dextrose  infusion.  She still does not want to stay.  Patient was given additional food.  She states she can monitor her sugar at home.      Trish Furl, MD 02/14/24 1335

## 2024-02-14 NOTE — Telephone Encounter (Signed)
 Sent pt [employer]. KH

## 2024-02-14 NOTE — Telephone Encounter (Signed)
 Copied from CRM [id number]. Topic: General - Other >> Feb 06, 2024  2:01 PM [name] wrote: Reason for CRM: The patient wants her provider to know a fax form will be coming over from [employer] concerning her chuck pads and items. She just needs it filled out so she can get her items. Please be on the lookout and assist further   Advised Kh

## 2024-02-15 ENCOUNTER — Other Ambulatory Visit: Payer: Self-pay

## 2024-02-18 ENCOUNTER — Other Ambulatory Visit: Payer: Self-pay | Admitting: Nurse Practitioner

## 2024-02-18 ENCOUNTER — Other Ambulatory Visit: Payer: Self-pay

## 2024-02-18 MED ORDER — TIZANIDINE HCL 6 MG PO CAPS
6.0000 mg | ORAL_CAPSULE | Freq: Three times a day (TID) | ORAL | 2 refills | Status: DC
Start: 2024-02-18 — End: 2024-02-22

## 2024-02-18 NOTE — Telephone Encounter (Signed)
 Copied from CRM [id number]. Topic: Clinical - Medication Refill >> Feb 18, 2024 12:21 PM Turkey B wrote: Most Recent Primary Care Visit:  Provider: [name]  Department: [facility]  Visit Type: HOSPITAL FOLLOW UP  Date: 12/21/2023  Medication: tizanidine  (ZANAFLEX ) 6 MG capsule Shows prescribed on 05/01 by Dr but no status showing after this day  Has the patient contacted their pharmacy? No, but the pharmacy contacted pt letting her know still not approved by Dr and pt is out of the hospital   Is this the correct pharmacy for this prescription? yes This is the patient's preferred pharmacy:   [facility], [place] - [address] AT [facility] OF [address] [name] [facility] [address] Phone: [phone] Fax: [phone]    Has the prescription been filled recently? no  Is the patient out of the medication? yes  Has the patient been seen for an appointment in the last year OR does the patient have an upcoming appointment? yes  Can we respond through MyChart? yes  Agent: Please be advised that Rx refills may take up to 3 business days. We ask that you follow-up with your pharmacy.

## 2024-02-19 ENCOUNTER — Other Ambulatory Visit: Payer: Self-pay

## 2024-02-19 ENCOUNTER — Telehealth: Payer: Self-pay

## 2024-02-19 MED ORDER — PREGABALIN 100 MG PO CAPS: 100.0000 mg | ORAL_CAPSULE | Freq: Three times a day (TID) | ORAL | 0 refills | Status: DC

## 2024-02-19 MED ORDER — DICLOFENAC SODIUM 1 % EX GEL
2.0000 g | CUTANEOUS | 1 refills | Status: AC
  Filled 2024-02-19: qty 100, 13d supply, fill #0
  Filled 2024-06-20 – 2024-09-30 (×2): qty 100, 13d supply, fill #1

## 2024-02-19 MED ORDER — OXYCODONE HCL 10 MG PO TABS
10.0000 mg | ORAL_TABLET | Freq: Four times a day (QID) | ORAL | 0 refills | Status: DC | PRN
  Filled 2024-02-19: qty 120, 30d supply, fill #0

## 2024-02-19 NOTE — Telephone Encounter (Unsigned)
 Copied from CRM [id number]. Topic: Clinical - Medication Question >> Feb 19, 2024 12:31 PM Bearl Botts E wrote: Reason for CRM: Pt called requesting a weight loss pill, is scheduled on 02/25/2024. Will discuss this with her PCP she says   Best contact: [phone]

## 2024-02-20 NOTE — Telephone Encounter (Signed)
 Pt notified on identifiable VM

## 2024-02-20 NOTE — Telephone Encounter (Signed)
 Sorry she is on chronic pain medications ,would like to wait to see if sleep apnea is present before prescribing additional sedating medications .

## 2024-02-21 ENCOUNTER — Other Ambulatory Visit: Payer: Self-pay | Admitting: Nurse Practitioner

## 2024-02-21 ENCOUNTER — Telehealth: Admitting: Obstetrics and Gynecology

## 2024-02-21 DIAGNOSIS — N939 Abnormal uterine and vaginal bleeding, unspecified: Secondary | ICD-10-CM | POA: Diagnosis not present

## 2024-02-21 MED ORDER — NORETHINDRONE ACETATE 5 MG PO TABS: 5.0000 mg | ORAL_TABLET | Freq: Two times a day (BID) | ORAL | 11 refills | Status: DC | PRN

## 2024-02-21 NOTE — Progress Notes (Signed)
 GYNECOLOGY VIRTUAL VISIT ENCOUNTER NOTE  Provider location: [facility] for [facility]   Patient location: Home  I connected with Jill Shaw on 02/21/24 at  3:35 PM EDT by MyChart Video Encounter and verified that I am speaking with the correct person using two identifiers.  Attempted to completed mychart visit but unable to as patient states not able to log into her mychart.    I discussed the limitations, risks, security and privacy concerns of performing an evaluation and management service virtually and the availability of in person appointments. I also discussed with the patient that there may be a patient responsible charge related to this service. The patient expressed understanding and agreed to proceed.   History:  Jill Shaw is a 48 y.o. G3P0 female being evaluated today for AUB follow up.  Mirena  appears to be shifted on last imaging and would need to be under to remove. Having some bleeding every so often, will bleed for 1-2 days and it's a lot better. Currently taking norethindrone  twice a day and has minimal bleeding intermittently and pain intermittently. Wondering when she would need to have her IUD exchanged.    Past Medical History:  Diagnosis Date   Abnormal uterine bleeding (AUB)    Arthritis    knees, hands   Atypical chest pain    cardiology--- dr Filiberto Hug did cardiac cath 08-30-2021 showed minimal luminal irregularity involving LAD all other coronaries w/ normal  flow   Chronic diastolic (congestive) heart failure [facility])    cardiologist---- dr Filiberto Hug;  preserved ef   Chronic iron  deficiency anemia    Chronic pain syndrome    followed by pain management--- dr Alessandra Ancona   CKD (chronic kidney disease), stage III (HCC)    Diabetic gastroparesis (HCC)    Diabetic peripheral neuropathy (HCC)    Fibromyalgia    GAD (generalized anxiety disorder)    Generalized abdominal pain    GERD (gastroesophageal reflux disease)     History of acute renal failure 03/21/2023   admission in epic due to N/V/D due ot severe sepsis POA due to UTI   History of chronic gastritis    inflammatory   History of diabetic ketoacidosis    multiple admission's last 3 in epic 06/ 2022;  01/ 2022;   09/ 2021   History of seizure 01/2016   hypoglycemic seizure   History of vertebral compression fracture 12/2020   T11 -- T12 & L1   Hyperlipidemia    Hypertension    followed by pcp   Insulin  dependent type 2 diabetes mellitus (HCC)    uncontrolled,  followed by pcp   Irritable bowel syndrome with constipation    MDD (major depressive disorder)    Moderate COPD (chronic obstructive pulmonary disease) (HCC)    pulmology--- dr Thelda Finney   Moderate persistent asthma    Sickle cell trait (HCC)    Vitamin D  deficiency 10/2019   Wears glasses    Past Surgical History:  Procedure Laterality Date   CATARACT EXTRACTION W/ INTRAOCULAR LENS IMPLANT Left    CESAREAN SECTION  2001   for twins   DILATION AND CURETTAGE OF UTERUS N/A 08/20/2019   Procedure: DILATATION AND CURETTAGE;  Surgeon: Ana Balling, MD;  Location: [facility] OR;  Service: Gynecology;  Laterality: N/A;   DILATION AND CURETTAGE OF UTERUS  05/01/2023   Procedure: DILATATION AND CURETTAGE;  Surgeon: Kiki Pelton, MD;  Location: [facility];  Service: Gynecology;;   ENDOMETRIAL  ABLATION N/A 08/20/2019   Procedure: Minerva Ablation;  Surgeon: Ana Balling, MD;  Location: [facility] OR;  Service: Gynecology;  Laterality: N/A;   EYE SURGERY Bilateral    laser right and cataract removed left eye   HYSTEROSCOPY N/A 05/01/2023   Procedure: HYSTEROSCOPY;  Surgeon: Kiki Pelton, MD;  Location: [facility];  Service: Gynecology;  Laterality: N/A;   INTRAUTERINE DEVICE (IUD) INSERTION N/A 05/01/2023   Procedure: INTRAUTERINE DEVICE (IUD) INSERTION;  Surgeon: Kiki Pelton, MD;  Location: [facility];  Service: Gynecology;  Laterality: N/A;    LEFT HEART CATH AND CORONARY ANGIOGRAPHY N/A 08/30/2021   Procedure: LEFT HEART CATH AND CORONARY ANGIOGRAPHY;  Surgeon: Cody Das, MD;  Location: [facility];  Service: Cardiovascular;  Laterality: N/A;   RADIOLOGY WITH ANESTHESIA N/A 09/16/2019   Procedure: MRI WITH ANESTHESIA   L SPINE WITHOUT CONTRAST, T SPINE WITHOUT CONTRAST , CERVICAL WITHOUT CONTRAST;  Surgeon: Radiologist, Medication, MD;  Location: [facility] OR;  Service: Radiology;  Laterality: N/A;   TOOTH EXTRACTION N/A 01/14/2024   Procedure: DENTAL RESTORATION/EXTRACTIONS;  Surgeon: Ascencion Lava, DMD;  Location: [facility] OR;  Service: Oral Surgery;  Laterality: N/A;   TUBAL LIGATION     interval BTL   UPPER GI ENDOSCOPY  07/2017   The following portions of the patient's history were reviewed and updated as appropriate: allergies, current medications, past family history, past medical history, past social history, past surgical history and problem list.   Health Maintenance:      Component Value Date/Time   DIAGPAP  07/25/2021 1442    - Negative for intraepithelial lesion or malignancy (NILM)   DIAGPAP  07/25/2017 0000    NEGATIVE FOR INTRAEPITHELIAL LESIONS OR MALIGNANCY.   HPVHIGH Negative 07/25/2021 1442   ADEQPAP  07/25/2021 1442    Satisfactory for evaluation; transformation zone component PRESENT.   ADEQPAP  07/25/2017 0000    Satisfactory for evaluation  endocervical/transformation zone component PRESENT.    Review of Systems:  Pertinent items noted in HPI and remainder of comprehensive ROS otherwise negative.  Physical Exam:   General:  Alert, oriented and cooperative. Patient appears to be in no acute distress.  Mental Status: Normal mood and affect. Normal behavior. Normal judgment and thought content.   Respiratory: Normal respiratory effort, no problems with respiration noted  Rest of physical exam deferred due to type of encounter  Labs and Imaging Results for orders placed or performed during the  hospital encounter of 02/14/24 (from the past 2 weeks)  CBG monitoring, ED   Collection Time: 02/14/24  1:13 PM  Result Value Ref Range   Glucose-Capillary 111 (H) 70 - 99 mg/dL  CBG monitoring, ED   Collection Time: 02/14/24  1:26 PM  Result Value Ref Range   Glucose-Capillary 164 (H) 70 - 99 mg/dL   *Note: Due to a large number of results and/or encounters for the requested time period, some results have not been displayed. A complete set of results can be found in Results Review.        Assessment and Plan:     1. Abnormal uterine bleeding (AUB) Will continue aygestin  with IUD in place. If IUD becomes more bothersome include increased bleeding or pain, will plan for removal under anesthesia with possible replacement. - norethindrone  (AYGESTIN ) 5 MG tablet; Take 1 tablet (5 mg total) by mouth 2 (two) times daily as needed (bleeding).  Dispense: 60 tablet; Refill: 11      I discussed the assessment  and treatment plan with the patient. The patient was provided an opportunity to ask questions and all were answered. The patient agreed with the plan and demonstrated an understanding of the instructions.   The patient was advised to call back or seek an in-person evaluation/go to the ED if the symptoms worsen or if the condition fails to improve as anticipated.  I provided 5 minutes of face-to-face time during this encounter. I also spent 5 minutes dedicated to the care of this patient including pre-visit review of records, post visit ordering of medications and appropriate tests or procedures, coordinating care and documenting this visit encounter.    Kiki Pelton, MD Center for [employer], [facility]

## 2024-02-22 ENCOUNTER — Other Ambulatory Visit: Payer: Self-pay | Admitting: Nurse Practitioner

## 2024-02-22 ENCOUNTER — Telehealth: Payer: Self-pay

## 2024-02-22 MED ORDER — TIZANIDINE HCL 4 MG PO TABS
4.0000 mg | ORAL_TABLET | Freq: Three times a day (TID) | ORAL | 0 refills | Status: DC | PRN
  Filled 2024-02-22: qty 30, 10d supply, fill #0

## 2024-02-22 NOTE — Telephone Encounter (Unsigned)
 Copied from CRM [id number]. Topic: Clinical - Prescription Issue >> Feb 22, 2024  3:04 PM Carlatta H wrote: Reason for CRM: Patients insurance will no longer cover the tizanidine  (ZANAFLEX ) 6 MG capsule [[id number]] prescription//Patient stated it has to be written for 4 MG//Patient would also like a phone call once its sent to pharmacy//

## 2024-02-25 ENCOUNTER — Other Ambulatory Visit: Payer: Self-pay

## 2024-02-25 ENCOUNTER — Ambulatory Visit (INDEPENDENT_AMBULATORY_CARE_PROVIDER_SITE_OTHER): Payer: Self-pay | Admitting: Nurse Practitioner

## 2024-02-25 ENCOUNTER — Encounter: Payer: Self-pay | Admitting: Nurse Practitioner

## 2024-02-25 VITALS — BP 123/53 | HR 73 | Temp 98.2°F | Wt 290.8 lb

## 2024-02-25 DIAGNOSIS — F419 Anxiety disorder, unspecified: Secondary | ICD-10-CM

## 2024-02-25 DIAGNOSIS — Z794 Long term (current) use of insulin: Secondary | ICD-10-CM

## 2024-02-25 DIAGNOSIS — E119 Type 2 diabetes mellitus without complications: Secondary | ICD-10-CM

## 2024-02-25 LAB — POCT GLYCOSYLATED HEMOGLOBIN (HGB A1C): Hemoglobin A1C: 8.9 % — AB (ref 4.0–5.6)

## 2024-02-25 MED ORDER — HYDROXYZINE HCL 10 MG PO TABS
10.0000 mg | ORAL_TABLET | Freq: Three times a day (TID) | ORAL | 0 refills | Status: DC | PRN
  Filled 2024-02-25: qty 30, 10d supply, fill #0

## 2024-02-25 NOTE — Progress Notes (Signed)
 Subjective   Patient ID: Jill Shaw, female    DOB: [birth year], 48 y.o.   MRN: [id number]  Chief Complaint  Patient presents with   Diabetes    Follow up    Referring provider: [name], NP  Jill Shaw is a 48 y.o. female with Past Medical History: No date: Abnormal uterine bleeding (AUB) No date: Arthritis     Comment:  knees, hands No date: Atypical chest pain     Comment:  cardiology--- dr Filiberto Hug did cardiac cath 08-30-2021               showed minimal luminal irregularity involving LAD all               other coronaries w/ normal  flow No date: Chronic diastolic (congestive) heart failure (HCC)     Comment:  cardiologist---- dr Filiberto Hug;  preserved ef No date: Chronic iron  deficiency anemia No date: Chronic pain syndrome     Comment:  followed by pain management--- dr Alessandra Ancona No date: CKD (chronic kidney disease), stage III (HCC) No date: Diabetic gastroparesis (HCC) No date: Diabetic peripheral neuropathy (HCC) No date: Fibromyalgia No date: GAD (generalized anxiety disorder) No date: Generalized abdominal pain No date: GERD (gastroesophageal reflux disease) 03/21/2023: History of acute renal failure     Comment:  admission in epic due to N/V/D due ot severe sepsis POA               due to UTI No date: History of chronic gastritis     Comment:  inflammatory No date: History of diabetic ketoacidosis     Comment:  multiple admission's last 3 in epic 06/ 2022;  01/ 2022;              09/ 2021 01/2016: History of seizure     Comment:  hypoglycemic seizure 12/2020: History of vertebral compression fracture     Comment:  T11 -- T12 & L1 No date: Hyperlipidemia No date: Hypertension     Comment:  followed by pcp No date: Insulin  dependent type 2 diabetes mellitus (HCC)     Comment:  uncontrolled,  followed by pcp No date: Irritable bowel syndrome with constipation No date: MDD (major depressive disorder) No date: Moderate COPD (chronic  obstructive pulmonary disease) (HCC)     Comment:  pulmology--- dr Thelda Finney No date: Moderate persistent asthma No date: Sickle cell trait (HCC) 10/2019: Vitamin D  deficiency No date: Wears glasses  Diabetes    Jill Shaw presents for follow up. She  has a past medical history of Anemia, Arthritis, Asthma, Chronic diastolic (congestive) heart failure (HCC), COPD (chronic obstructive pulmonary disease) (HCC), Diabetes mellitus without complication (HCC), DKA (diabetic ketoacidosis) (HCC) (10/15/2020), Dysfunctional uterine bleeding, GERD (gastroesophageal reflux disease), Hypertension, Neuromuscular disorder (HCC), Seizures (HCC) (09/12/2017), Sickle cell trait (HCC), Smoker, Vitamin D  deficiency (10/2019), and Wears glasses.     Patient presents today for a diabetic follow-up. Is following with cardiology. Appears that patient is noncompliant with medications.  A1c in office today is 8.9.  We will have her consult with pharmacy for diabetic medication management and med reconciliation.  We advised her for future visits to bring all of her medications with her to the visit. Denies f/c/s, n/v/d, hemoptysis, PND, leg swelling. Denies chest pain or edema.    Allergies  Allergen Reactions   Elavil  [Amitriptyline ] Other (See Comments)    Coma   Orudis [Ketoprofen] Nausea And Vomiting   Desyrel  [Trazodone ] Nausea And Vomiting  Tylenol  [Acetaminophen ] Nausea And Vomiting   Aspirin  Nausea Only   Motrin  [Ibuprofen ] Nausea And Vomiting   Naprosyn [Naproxen] Nausea And Vomiting   Neurontin [Gabapentin] Nausea And Vomiting and Other (See Comments)    upset stomach   Sulfa Antibiotics Nausea And Vomiting   Ultram  [Tramadol ] Nausea And Vomiting and Other (See Comments)    stomach upset   Victoza [Liraglutide] Nausea And Vomiting   Zegerid [Omeprazole -Sodium Bicarbonate ] Nausea And Vomiting    Immunization History  Administered Date(s) Administered   Influenza, Seasonal, Injecte, Preservative  Fre 07/30/2023   Influenza,inj,Quad PF,6+ Mos 09/03/2018, 07/03/2019, 07/04/2020, 07/01/2021   PNEUMOCOCCAL CONJUGATE-20 07/30/2023   Pneumococcal Polysaccharide-23 03/28/2015, 07/04/2020   Tdap 03/27/2016   Unspecified SARS-COV-2 Vaccination 03/25/2020, 05/05/2020    Tobacco History: Social History   Tobacco Use  Smoking Status Some Days   Current packs/day: 0.00   Average packs/day: 0.3 packs/day for 26.0 years (6.5 ttl pk-yrs)   Types: Cigarettes   Start date: 09/13/1994   Last attempt to quit: 09/13/2020   Years since quitting: 3.4  Smokeless Tobacco Never  Tobacco Comments   Pt smokes 1/2 ppd. AB. CMA 09-10-23   Ready to quit: Yes Counseling given: Yes Tobacco comments: Pt smokes 1/2 ppd. AB. CMA 09-10-23   Outpatient Encounter Medications as of 02/25/2024  Medication Sig   Accu-Chek Softclix Lancets lancets USE TO TEST AS DIRECTED UP TO FOUR TIMES DAILY.   albuterol  (PROVENTIL ) (2.5 MG/3ML) 0.083% nebulizer solution Take 3 mLs (2.5 mg total) by nebulization every 6 (six) hours as needed for wheezing or shortness of breath.   albuterol  (VENTOLIN  HFA) 108 (90 Base) MCG/ACT inhaler Inhale 2 puffs into the lungs every 6 (six) hours as needed for wheezing or shortness of breath.   B-D ULTRAFINE III SHORT PEN 31G X 8 MM MISC USE AS DIRECTED EVERY MORNING, NOON, EVENING, AND AT BEDTIME   budesonide -formoterol  (SYMBICORT ) 160-4.5 MCG/ACT inhaler Inhale 2 puffs into the lungs 2 (two) times daily.   Dexlansoprazole  30 MG capsule DR TAKE 1 CAPSULE (30 MG TOTAL) BY MOUTH DAILY. NEEDS OFFICE VISIT FOR ADDITIONAL REFILLS   diclofenac  Sodium (VOLTAREN ) 1 % GEL Apply 2-4 g topically 4 (four) times daily as needed.   empagliflozin  (JARDIANCE ) 10 MG TABS tablet Take 1 tablet (10 mg total) by mouth daily.   furosemide  (LASIX ) 40 MG tablet Take 1 tablet (40 mg total) by mouth daily.   glucose blood (ACCU-CHEK GUIDE) test strip USE TO TEST BLOOD GLUCOSE AS DIRECTED UP TO 4 TIMES DAILY    HUMALOG  KWIKPEN 200 UNIT/ML KwikPen Inject 60 Units into the skin 3 (three) times daily before meals.   hydrALAZINE  (APRESOLINE ) 25 MG tablet Take 1 tablet (25 mg total) by mouth 3 (three) times daily.   hydrOXYzine  (ATARAX ) 10 MG tablet Take 1 tablet (10 mg total) by mouth 3 (three) times daily as needed.   insulin  lispro protamine-lispro (HUMALOG  75/25 MIX) (75-25) 100 UNIT/ML SUSP injection Inject 60 Units into the skin in the morning, at noon, and at bedtime.   Melatonin 10 MG TABS Take 10 mg by mouth at bedtime as needed (insomnia).   metoCLOPramide  (REGLAN ) 10 MG tablet Take 1 tablet (10 mg total) by mouth every 8 (eight) hours as needed for nausea.   metoprolol  succinate (TOPROL -XL) 100 MG 24 hr tablet Take 1 tablet (100 mg total) by mouth daily. Take with or immediately following a meal.   naloxone  (NARCAN ) nasal spray 4 mg/0.1 mL Insert 1 spray into one nostril if  poorly responding / turning blue. CALL 911 ASAP   norethindrone  (AYGESTIN ) 5 MG tablet Take 1 tablet (5 mg total) by mouth 2 (two) times daily as needed (bleeding).   ondansetron  (ZOFRAN -ODT) 4 MG disintegrating tablet Take 1 tablet (4 mg total) by mouth every 8 (eight) hours as needed for nausea or vomiting.   Oxycodone  HCl 10 MG TABS Take 1 tablet (10 mg total) by mouth 4 (four) times daily as needed for pain.   pantoprazole  (PROTONIX ) 20 MG tablet Take 1 tablet (20 mg total) by mouth daily.   Pimozide 1 MG TABS Take 1 mg by mouth daily as needed (feeling of facial itching).   potassium chloride  SA (KLOR-CON  M) 20 MEQ tablet Take 1 tablet (20 mEq total) by mouth daily.   pregabalin  (LYRICA ) 100 MG capsule Take 1 (one) capsule by mouth three times daily, Decrease to twice daily if feel off balance or overmedicated.   rosuvastatin  (CRESTOR ) 20 MG tablet TAKE 1 TABLET(20 MG) BY MOUTH DAILY   spironolactone  (ALDACTONE ) 25 MG tablet Take 1 tablet (25 mg total) by mouth daily.   sucralfate  (CARAFATE ) 1 g tablet Take 1 tablet (1 g  total) by mouth 4 (four) times daily -  with meals and at bedtime.   tiZANidine  (ZANAFLEX ) 4 MG tablet Take 1 tablet (4 mg total) by mouth every 8 (eight) hours as needed for muscle spasms.   Continuous Blood Gluc Sensor (FREESTYLE LIBRE 3 SENSOR) MISC Place 1 sensor on the skin every 14 days. Use to check glucose continuously (Patient not taking: Reported on 02/21/2024)   diclofenac  Sodium (VOLTAREN ) 1 % GEL Apply 2 to 4 gram to painful sites up to 4 times daily if needed, max daily dose: 32 Gram (Patient not taking: Reported on 02/25/2024)   dicyclomine  (BENTYL ) 20 MG tablet Take 1 tablet (20 mg total) by mouth 2 (two) times daily. (Patient not taking: Reported on 02/25/2024)   nitroGLYCERIN  (NITROSTAT ) 0.4 MG SL tablet Place 1 tablet (0.4 mg total) under the tongue every 5 (five) minutes as needed for chest pain. Additional refills to be filled by PCP, patient aware (Patient not taking: Reported on 02/21/2024)   Oxycodone  HCl 10 MG TABS Take 1 tablet (10 mg total) by mouth in the morning, at noon, in the evening, and at bedtime if needed for pain (Patient not taking: Reported on 02/21/2024)   TOUJEO  MAX SOLOSTAR 300 UNIT/ML Solostar Pen Inject 60 Units into the skin in the morning. (Patient not taking: Reported on 02/25/2024)   No facility-administered encounter medications on file as of 02/25/2024.    Review of Systems  Review of Systems  Constitutional: Negative.   HENT: Negative.    Cardiovascular: Negative.   Gastrointestinal: Negative.   Allergic/Immunologic: Negative.   Neurological: Negative.   Psychiatric/Behavioral: Negative.       Objective:   BP (!) 123/53   Pulse 73   Temp 98.2 F (36.8 C) (Oral)   Wt 290 lb 12.8 oz (131.9 kg)   SpO2 95%   BMI 49.92 kg/m   Wt Readings from Last 5 Encounters:  02/25/24 290 lb 12.8 oz (131.9 kg)  02/14/24 270 lb (122.5 kg)  02/14/24 284 lb 3.2 oz (128.9 kg)  02/07/24 281 lb 4.9 oz (127.6 kg)  01/14/24 270 lb (122.5 kg)     Physical  Exam Vitals and nursing note reviewed.  Constitutional:      General: She is not in acute distress.    Appearance: She is well-developed.  Cardiovascular:  Rate and Rhythm: Normal rate and regular rhythm.  Pulmonary:     Effort: Pulmonary effort is normal.     Breath sounds: Normal breath sounds.  Neurological:     Mental Status: She is alert and oriented to person, place, and time.       Assessment & Plan:   Anxiety -     hydrOXYzine  HCl; Take 1 tablet (10 mg total) by mouth 3 (three) times daily as needed.  Dispense: 30 tablet; Refill: 0  Controlled type 2 diabetes mellitus without complication, with long-term current use of insulin  (HCC) -     Microalbumin / creatinine urine ratio -     POCT glycosylated hemoglobin (Hb A1C) -     AMB Referral [facility]BCI Care Management -     CBC -     Comprehensive metabolic panel with GFR     Return in about 3 months (around 05/27/2024).   [name], NP 02/25/2024

## 2024-02-25 NOTE — Patient Instructions (Signed)
 1. Controlled type 2 diabetes mellitus without complication, with long-term current use of insulin  (HCC)  - Urine Albumin/Creatinine with ratio (send out) [LAB689] - POCT glycosylated hemoglobin (Hb A1C) - AMB Referral VBCI Care Management - CBC - Comprehensive metabolic panel with GFR  2. Anxiety (Primary)  - hydrOXYzine  (ATARAX ) 10 MG tablet; Take 1 tablet (10 mg total) by mouth 3 (three) times daily as needed.  Dispense: 30 tablet; Refill: 0

## 2024-02-26 LAB — COMPREHENSIVE METABOLIC PANEL WITH GFR
ALT: 6 IU/L (ref 0–32)
AST: 17 IU/L (ref 0–40)
Albumin: 4.4 g/dL (ref 3.9–4.9)
Alkaline Phosphatase: 119 IU/L (ref 44–121)
BUN/Creatinine Ratio: 10 (ref 9–23)
BUN: 15 mg/dL (ref 6–24)
Bilirubin Total: 0.2 mg/dL (ref 0.0–1.2)
CO2: 24 mmol/L (ref 20–29)
Calcium: 9.5 mg/dL (ref 8.7–10.2)
Chloride: 100 mmol/L (ref 96–106)
Creatinine, Ser: 1.56 mg/dL — ABNORMAL HIGH (ref 0.57–1.00)
Globulin, Total: 2.6 g/dL (ref 1.5–4.5)
Glucose: 131 mg/dL — ABNORMAL HIGH (ref 70–99)
Potassium: 3.8 mmol/L (ref 3.5–5.2)
Sodium: 141 mmol/L (ref 134–144)
Total Protein: 7 g/dL (ref 6.0–8.5)
eGFR: 41 mL/min/{1.73_m2} — ABNORMAL LOW (ref >59)

## 2024-02-26 LAB — MICROALBUMIN / CREATININE URINE RATIO
Creatinine, Urine: 47.2 mg/dL
Microalb/Creat Ratio: 145 mg/g{creat} — ABNORMAL HIGH (ref 0–29)
Microalbumin, Urine: 68.6 ug/mL

## 2024-02-26 LAB — CBC
Hematocrit: 44.6 % (ref 34.0–46.6)
Hemoglobin: 14.4 g/dL (ref 11.1–15.9)
MCH: 28.1 pg (ref 26.6–33.0)
MCHC: 32.3 g/dL (ref 31.5–35.7)
MCV: 87 fL (ref 79–97)
Platelets: 197 10*3/uL (ref 150–450)
RBC: 5.12 x10E6/uL (ref 3.77–5.28)
RDW: 14.5 % (ref 11.7–15.4)
WBC: 10.8 10*3/uL (ref 3.4–10.8)

## 2024-02-27 ENCOUNTER — Other Ambulatory Visit: Payer: Self-pay | Admitting: Nurse Practitioner

## 2024-02-27 ENCOUNTER — Ambulatory Visit: Payer: Self-pay | Admitting: Nurse Practitioner

## 2024-02-27 ENCOUNTER — Other Ambulatory Visit: Payer: Self-pay

## 2024-02-27 DIAGNOSIS — N289 Disorder of kidney and ureter, unspecified: Secondary | ICD-10-CM

## 2024-02-28 ENCOUNTER — Other Ambulatory Visit (HOSPITAL_COMMUNITY): Payer: Self-pay

## 2024-02-28 ENCOUNTER — Other Ambulatory Visit: Payer: Self-pay

## 2024-02-29 ENCOUNTER — Telehealth: Payer: Self-pay

## 2024-02-29 ENCOUNTER — Other Ambulatory Visit: Payer: Self-pay | Admitting: Nurse Practitioner

## 2024-02-29 ENCOUNTER — Other Ambulatory Visit (HOSPITAL_COMMUNITY): Payer: Self-pay

## 2024-02-29 ENCOUNTER — Other Ambulatory Visit: Payer: Self-pay

## 2024-02-29 MED ORDER — DEXLANSOPRAZOLE 30 MG PO CPDR: 30.0000 mg | DELAYED_RELEASE_CAPSULE | Freq: Every day | ORAL | 1 refills | Status: DC

## 2024-02-29 NOTE — Telephone Encounter (Signed)
 Copied from CRM [id number]. Topic: Clinical - Medication Question >> Feb 29, 2024  1:47 PM Chrystal Crape R wrote: Chrisonna from [facility] calling for diagnosis as well as what medications were tried and fail pior to selecting Dexlansoprazole  30 MG capsule DR for pt.  Call back number is [phone] option 3

## 2024-03-03 ENCOUNTER — Telehealth: Payer: Self-pay

## 2024-03-03 ENCOUNTER — Other Ambulatory Visit: Payer: Self-pay | Admitting: Nurse Practitioner

## 2024-03-03 ENCOUNTER — Ambulatory Visit: Payer: Self-pay

## 2024-03-03 ENCOUNTER — Other Ambulatory Visit: Payer: Self-pay

## 2024-03-03 ENCOUNTER — Telehealth: Payer: Self-pay | Admitting: Nurse Practitioner

## 2024-03-03 MED ORDER — BUSPIRONE HCL 10 MG PO TABS
10.0000 mg | ORAL_TABLET | Freq: Two times a day (BID) | ORAL | 2 refills | Status: AC
  Filled 2024-03-03: qty 60, 30d supply, fill #0

## 2024-03-03 NOTE — Telephone Encounter (Signed)
 Copied from CRM [id number]. Topic: Clinical - Prescription Issue >> Mar 03, 2024  9:08 AM Jill Shaw wrote: Reason for CRM: Patient is calling in because of her hydrOXYzine  (ATARAX ) 10 MG tablet  She states it does not help her at all. When she takes it she gets a rash and shes still not sleeping. She sleeps about 2 hours at a time before she wakes up again  She did get rid of the medication and would like to know if there is anything else she can be prescribed

## 2024-03-03 NOTE — Telephone Encounter (Signed)
 Completed.

## 2024-03-03 NOTE — Telephone Encounter (Signed)
 Reason for Disposition . [1] Caller has URGENT medicine question about med that PCP or specialist prescribed AND [2] triager unable to answer question  Answer Assessment - Initial Assessment Questions 1. NAME of MEDICINE: "What medicine(s) are you calling about?"     Hydroxyzine   2. QUESTION: "What is your question?" (e.g., double dose of medicine, side effect)     Medication not working, wanting something different  3. PRESCRIBER: "Who prescribed the medicine?" Reason: if prescribed by specialist, call should be referred to that group.     PCP 4. SYMPTOMS: "Do you have any symptoms?" If Yes, ask: "What symptoms are you having?"  "How bad are the symptoms (e.g., mild, moderate, severe)     Rash and difficulty sleeping, only sleeping 2 hours per night  Protocols used: Medication Question Call-A-AH

## 2024-03-03 NOTE — Telephone Encounter (Signed)
  Chief Complaint: medication problem Symptoms: rash an difficulty sleeping, anxiety still present  Frequency: 1 week or more  Pertinent Negatives: NA Disposition: [] ED /[] Urgent Care (no appt availability in office) / [] Appointment(In office/virtual)/ []  [place] [facility]/ [] Home Care/ [] Refused Recommended Disposition /[] [place] [facility]/ [x]  Follow-up with PCP Additional Notes: advised pt I would send message back to PCP. Informed pt recent message had been sent and waiting on provider FU. Offered to schedule FU appt but pt declined.  1. NAME of MEDICINE: "What medicine(s) are you calling about?"     Hydroxyzine   2. QUESTION: "What is your question?" (e.g., double dose of medicine, side effect)     Medication not working, wanting something different  3. PRESCRIBER: "Who prescribed the medicine?" Reason: if prescribed by specialist, call should be referred to that group.     PCP 4. SYMPTOMS: "Do you have any symptoms?" If Yes, ask: "What symptoms are you having?"  "How bad are the symptoms (e.g., mild, moderate, severe)     Rash and difficulty sleeping, only sleeping 2 hours per night  Copied from CRM #[id number]. Topic: Clinical - Prescription Issue >> Mar 03, 2024  9:08 AM Jenice Mitts wrote: Reason for CRM: Patient is calling in because of her hydrOXYzine  (ATARAX ) 10 MG tablet   She states it does not help her at all. When she takes it she gets a rash and shes still not sleeping. She sleeps about 2 hours at a time before she wakes up again  She did get rid of the medication and would like to know if there is anything else she can be prescribed

## 2024-03-03 NOTE — Telephone Encounter (Signed)
 Patient was identified as falling into the [place] Measure - Diabetes.   Patient was: Appointment already scheduled for:  02/25/2024 and 03/31/2024.

## 2024-03-04 ENCOUNTER — Telehealth: Payer: Self-pay

## 2024-03-04 ENCOUNTER — Other Ambulatory Visit: Payer: Self-pay

## 2024-03-04 ENCOUNTER — Telehealth: Payer: Self-pay | Admitting: *Deleted

## 2024-03-04 DIAGNOSIS — Z79899 Other long term (current) drug therapy: Secondary | ICD-10-CM

## 2024-03-04 DIAGNOSIS — E119 Type 2 diabetes mellitus without complications: Secondary | ICD-10-CM

## 2024-03-04 DIAGNOSIS — Z794 Long term (current) use of insulin: Secondary | ICD-10-CM

## 2024-03-04 NOTE — Progress Notes (Signed)
 Care Guide Pharmacy Note  03/04/2024 Name: Jill Shaw MRN: [id number] DOB: 10-Jan-1976  Referred By: [name], NP Reason for referral: Complex Care Management (Initial outreach to schedule with PharmD [facility])   Jill Shaw is a 48 y.o. year old female who is a primary care patient of [name], NP.  Jill Shaw was referred to the pharmacist for assistance related to: DMII  An unsuccessful telephone outreach was attempted today to contact the patient who was referred to the pharmacy team for assistance with medication management. Additional attempts will be made to contact the patient.  Barnie Bora  [facility]  [facility], [facility] Guide  Direct Dial : [phone]  Fax [phone]

## 2024-03-04 NOTE — Telephone Encounter (Signed)
 I called Mrs. Jill Shaw and had to leave a message letting her know the in Lab sleep study has been scheduled on 04/21/24 since 02/13/24. Amy Williams scheduled with [facility]. The sleep lab should have mailed paperwork for the patient to fill out before her appt

## 2024-03-04 NOTE — Telephone Encounter (Signed)
 Copied from CRM [id number]. Topic: General - Other >> Mar 03, 2024 12:10 PM Jill Shaw wrote: Reason for CRM:  Patient was told the office would call her to schedule her sleep study. Must be completed in hospital. No one has called her. Would like a call to set up her sleep study.

## 2024-03-04 NOTE — Progress Notes (Signed)
 Care Guide Pharmacy Note  03/04/2024 Name: Jill Shaw MRN: [id number] DOB: Jul 17, 1976  Referred By: [name], NP Reason for referral: Complex Care Management (Initial outreach to schedule with PharmD [facility])   Jill Shaw is a 48 y.o. year old female who is a primary care patient of [name], NP.  Jill Shaw was referred to the pharmacist for assistance related to: DMII  Successful contact was made with the patient to discuss pharmacy services including being ready for the pharmacist to call at least 5 minutes before the scheduled appointment time and to have medication bottles and any blood pressure readings ready for review. The patient agreed to meet with the pharmacist via telephone visit on (date/time).03/04/24 315  Barnie Bora  [facility], [facility] Guide  Direct Dial : [phone]  Fax [phone]

## 2024-03-04 NOTE — Progress Notes (Addendum)
 03/04/2024 Name: Jill Shaw MRN: [id number] DOB: 10-Sep-1976  Chief Complaint  Patient presents with   Diabetes    Jill Shaw is a 48 y.o. year old female who presented for a telephone visit.   They were referred to the pharmacist by their PCP for assistance in managing diabetes.    Subjective:  Care Team: Primary Care Provider: [name], NP ; Next Scheduled Visit: 03/31/2024 Cardiologist: Jill Banks, NP; Next Scheduled Visit: 04/03/2024  Medication Access/Adherence  Current Pharmacy:  [facility] [address], [address] [place] [address] Phone: [phone] Fax: [phone]  [id number] [name], [place] - [address] AT [facility] OF [address] [place] [address] Phone: [phone] Fax: [phone]  [id number] [name], [place] - [address] AT [facility] [name] [place] [address] Phone: [phone] Fax: [phone]  [name] [address] [place] [address] Phone: [phone] Fax: [phone]  [facility] [address] [place] [address] Phone: [phone] Fax: [phone]  [id number] [place], [place] - [address] AT [facility] & [address] [place] [address] Phone: [phone] Fax: [phone]   Patient reports affordability concerns with their medications: Yes  She cannot afford the cost of medications even with the copay for Medicare + Medicaid. She states that someone told her they would help get Extra Help LIS. No concerns affordability of utility bills and groceries. Denies needing referral for SDOH needs such as affording utility bills, rent, and groceries.  Patient reports access/transportation concerns  to their pharmacy: No  Patient reports adherence concerns with their medications:  No  her mother helps with her needs including medication management  and they use a pillbox (month supply).  -During medication review, patient was asked several times, she became slightly annoyed with the questions, about concomitant use of Humalog  75/25 and Toujeo . She stated that she has always been on both medications and was adamant that she was NOT confusing it with bolus insulin  Humalog  200 units/ml. She informed me that the only reason why the last medication reconciliation indicated she was NOT taking Humalog  75/25 was because she ran out of the medication.                   Diabetes:  Current medications: Toujeo  60 units every morning (patient was asked several times), Humalog  75/25 60 units three times daily (patient repeatedly stated that she is taking this medication and reported that she is NOT taking Humalog  200 units/mL 60 units TID despite recent fill), Jardiance  10 mg daily   Medications tried in the past: metformin (documented hx GI upset); Victoza (documented hx GI upset, also gastroparesis at baseline)   Previously prescribed CGM, but reported that she sweats too much to use device and it caused itching.   Current glucose readings:  Fasting  5/11: 188 5/12: 426  Pre- Lunch 5/11: 356 5/12: 296  Pre- Dinner 5/11 286 5/12: 410 She reports that she was unable to get data for the other BG readings from the BGM when asked for a 7-day BG reading.    Using AccuChek BGM meter; testing 3-4 times daily   Patient reports hypoglycemic s/sx including dizziness, shakiness, sweating. Patient reports hyperglycemic symptoms including polyuria, polydipsia, polyphagia, nocturia (worst  symptoms and disrupts sleep), neuropathy.   Patient reports that on 02/14/2024, she experienced altered mental status (AMS) due to very low blood glucose (BG). She was evaluated by EMS, treated in the  ambulance, and given medication to raise her BG. She states that she had eaten that day and had administered her insulin  as usual. Her emergency department (ED) BG readings were obtained after treatment for hypoglycemia.  Since the ED visit on 02/14/2024, the patient denies any further hypoglycemic events.  Over the past month, the patient reports a total of 3-4 low blood sugar episodes, including the event on 02/14/2024.    Current meal patterns:  - Breakfast: eggs, sausage, toast, regular OJ/coffee - Lunch fried or baked chicken, broccoli, mashed potatoes  - Supper sandwich (2 slices of bread) - Snacks low-fa snacks, chocolate cake, granola bar, chips  - Drinks: 1 Beer (~ once monthly) - which patient states it drives her to eat more sweets, water, diet soda, regular OJ, Cranberry Juice (regular), apple juice (regular), coffee  Current physical activity: Not at this time  -- She is interested in weight loss through [facility]   Current medication access support: She reports that she is supposed to get Extra help    Objective:  Lab Results  Component Value Date   HGBA1C 8.9 (A) 02/25/2024    Lab Results  Component Value Date   CREATININE 1.56 (H) 02/25/2024   BUN 15 02/25/2024   NA 141 02/25/2024   K 3.8 02/25/2024   CL 100 02/25/2024   CO2 24 02/25/2024    Lab Results  Component Value Date   CHOL 153 12/09/2021   HDL 54 12/09/2021   LDLCALC 74 12/09/2021   TRIG 144 12/09/2021   CHOLHDL 2.8 12/09/2021    Medications Reviewed Today     Reviewed by Jill Shaw, RPH (Pharmacist) on 03/04/24 at 1552  Med List Status: <None>   Medication Order Taking? Sig Documenting Provider Last Dose Status Informant  Accu-Chek Softclix Lancets lancets [id number] Yes USE TO TEST AS DIRECTED UP TO FOUR TIMES DAILY. Shaw, Jill Obey, MD Taking Active Self  albuterol  (PROVENTIL ) (2.5 MG/3ML) 0.083% nebulizer solution [id number] Yes Take 3 mLs (2.5 mg total) by nebulization every  6 (six) hours as needed for wheezing or shortness of breath. Parrett, Macdonald Savoy, NP Taking Active Self  albuterol  (VENTOLIN  HFA) 108 (90 Base) MCG/ACT inhaler [id number] Yes Inhale 2 puffs into the lungs every 6 (six) hours as needed for wheezing or shortness of breath. Jill Morel, NP Taking Active Self  B-D ULTRAFINE III SHORT PEN 31G X 8 MM MISC [id number] Yes USE AS DIRECTED EVERY MORNING, NOON, EVENING, AND AT BEDTIME Shaw, Jill Obey, MD Taking Active Self  budesonide -formoterol  (SYMBICORT ) 160-4.5 MCG/ACT inhaler [id number] Yes Inhale 2 puffs into the lungs 2 (two) times daily. Jill Morel, NP Taking Active Self           Med Note Vallarie Gauze, [facility] R   Tue Mar 04, 2024  3:38 PM) Taking differently: 2 puffs BID PRN (patient reports she informed her pulmonologist of this regimen since "everything is going well")  busPIRone  (BUSPAR ) 10 MG tablet [id number] Yes Take 1 tablet (10 mg total) by mouth 2 (two) times daily. Jill Morel, NP Taking Active   Continuous Blood Gluc Sensor (FREESTYLE LIBRE 3 SENSOR) [place] [id number] No Place 1 sensor on the skin every 14 days. Use to check glucose continuously  Patient not taking: Reported on 02/21/2024   Jill Morel,  NP Not Taking Active Self  Dexlansoprazole  30 MG capsule DR [id number] Yes Take 1 capsule (30 mg total) by mouth daily. NEEDS OFFICE VISIT FOR ADDITIONAL REFILLS Jill Morel, NP Taking Active   diclofenac  Sodium (VOLTAREN ) 1 % GEL [id number] Yes Apply 2 to 4 gram to painful sites up to 4 times daily if needed, max daily dose: 32 Gram  Taking Active   dicyclomine  (BENTYL ) 20 MG tablet [id number] Yes Take 1 tablet (20 mg total) by mouth 2 (two) times daily. Jill Morel, NP Taking Active Self  empagliflozin  (JARDIANCE ) 10 MG TABS tablet [id number] Yes Take 1 tablet (10 mg total) by mouth daily. Jill Morel, NP Taking Active Self  furosemide  (LASIX ) 40 MG tablet [id number] Yes Take 1 tablet (40 mg total) by mouth daily.  Jill Morel, NP Taking Active Self  glucose blood (ACCU-CHEK GUIDE) test strip [id number] Yes USE TO TEST BLOOD GLUCOSE AS DIRECTED UP TO 4 TIMES DAILY Nichols, Tonya S, NP Taking Active Self  hydrALAZINE  (APRESOLINE ) 25 MG tablet [id number] Yes Take 1 tablet (25 mg total) by mouth 3 (three) times daily. Jill Morel, NP Taking Active Self  insulin  lispro protamine-lispro (HUMALOG  75/25 MIX) (75-25) 100 UNIT/ML SUSP injection [id number] Yes Inject 60 Units into the skin in the morning, at noon, and at bedtime. [provider] Taking Active Self           Med Note Vallarie Gauze, [facility] R   Tue Mar 04, 2024  3:45 PM) Confirmed with patient she is taking Humalog  75/25  Melatonin 10 MG TABS [id number] Yes Take 10 mg by mouth at bedtime as needed (insomnia). Faith Homes, MD Taking Active   metoCLOPramide  (REGLAN ) 10 MG tablet [id number] No Take 1 tablet (10 mg total) by mouth every 8 (eight) hours as needed for nausea.  Patient not taking: Reported on 03/04/2024   Earma Gloss, MD Not Taking Active Self  metoprolol  succinate (TOPROL -XL) 100 MG 24 hr tablet [id number] Yes Take 1 tablet (100 mg total) by mouth daily. Take with or immediately following a meal. Jill Morel, NP Taking Active Self  naloxone  (NARCAN ) nasal spray 4 mg/0.1 mL [id number] No Insert 1 spray into one nostril if poorly responding / turning blue. CALL 911 ASAP  Patient not taking: Reported on 03/04/2024    Not Taking Active Self  nitroGLYCERIN  (NITROSTAT ) 0.4 MG SL tablet [id number] No Place 1 tablet (0.4 mg total) under the tongue every 5 (five) minutes as needed for chest pain. Additional refills to be filled by PCP, patient aware  Patient not taking: Reported on 02/21/2024   Jill Morel, NP Not Taking Active Self           Med Note Vallarie Gauze, [facility] R   Tue Mar 04, 2024  3:48 PM) She doesn't have the original bottle to determine the expiration date   norethindrone  (AYGESTIN ) 5 MG tablet [id number] Yes Take 1 tablet  (5 mg total) by mouth 2 (two) times daily as needed (bleeding). Ajewole, Christana, MD Taking Active   ondansetron  (ZOFRAN -ODT) 4 MG disintegrating tablet [id number] Yes Take 1 tablet (4 mg total) by mouth every 8 (eight) hours as needed for nausea or vomiting. Barrett, Kandace Organ, PA-C Taking Active Self  Oxycodone  HCl 10 MG TABS [id number] No Take 1 tablet (10 mg total) by mouth 4 (four) times daily as needed for pain.  Patient not taking: Reported on 03/04/2024    Not Taking Active   potassium chloride  SA (KLOR-CON  M) 20  MEQ tablet [id number] Yes Take 1 tablet (20 mEq total) by mouth daily. Jill Morel, NP Taking Active Self  pregabalin  (LYRICA ) 100 MG capsule [id number] Yes Take 1 (one) capsule by mouth three times daily, Decrease to twice daily if feel off balance or overmedicated.  Taking Active   rosuvastatin  (CRESTOR ) 20 MG tablet [id number] Yes TAKE 1 TABLET(20 MG) BY MOUTH DAILY Patwardhan, Manish J, MD Taking Active Self  spironolactone  (ALDACTONE ) 25 MG tablet [id number] Yes Take 1 tablet (25 mg total) by mouth daily. Jill Morel, NP Taking Active Self  sucralfate  (CARAFATE ) 1 g tablet [id number] Yes Take 1 tablet (1 g total) by mouth 4 (four) times daily -  with meals and at bedtime. Zehr, Jessica D, PA-C Taking Active Self  tizanidine  (ZANAFLEX ) 6 MG capsule [id number] Yes Take 6 mg by mouth 2 (two) times daily as needed for muscle spasms. [provider] Taking Active   TOUJEO  MAX SOLOSTAR 300 UNIT/ML Solostar Pen [id number] Yes Inject 60 Units into the skin in the morning. Jill Morel, NP Taking Active Self           Med Note Vallarie Gauze, [facility] R   Tue Mar 04, 2024  3:52 PM) Patient reports that she is taking Toujeo  60 units QAM              Assessment/Plan:   Diabetes: - Currently uncontrolled with a hemoglobin A1c 8.9% yet improved from last A1c (previously 9.4%). Informed patient that she would benefit from an endocrinologist given the volume of concentrated  insulin  she is taking daily, wide range of blood glucose requiring ED/hospital for hyperglycemia and hypoglycemia (02/14/2024 with documented AMS) in the past year, and reported insulin  regimen. Per chart review and patient, PCP placed a referral but Jill Shaw states the clinic has not called her to schedule an appointment. Patient was encouraged to contact PCP for information to contact endocrinology. Patient declined seeing a diabetic nutritionist. Given several hypoglycemic events, yet elevated glucose, patient could benefit from a dose reduction of basal insulin .  Although patient express interest in trying [facility], she was informed due to prior documentation of gastroparesis and was not able to tolerate Victoza, I do not believe she would be a good candidate for [facility] or any GLP1 and/or GIP/GLP1. Jill Shaw feels that since she is "fully aware" of the risks of being on Wegovy, she is willing to start the medication and believes that it should be her decision; if she begins to have side effects she knows to stop medication. Discussed [facility], and she declined seeing specialist.    - Reviewed long term cardiovascular and renal outcomes of uncontrolled blood sugar - Reviewed goal A1c, goal fasting, and goal 2 hour post prandial glucose - Reviewed dietary modifications including lower glycemic index foods, incorporating more protein, reduce sweets - Recommend to REDUCE Toujeo  to 56 units daily and will continue Humalog  60 units three times daily (if patient is actually on medication since there is a fill history for this medication) and DISCONTINUE Humalog  75/25 60 units three time daily. Will collaborate with PCP about above regimen, given patient's report of 3 or 4 low blood sugars in the past month (including being seen in the ED) recently.    - Patient denies personal or family history of multiple endocrine neoplasia type 2, medullary thyroid  cancer; personal history of  pancreatitis or gallbladder disease. - Recommend to check glucose four times daily (fasting, and post prandials for break,  lunch, and dinner). Discussed CGM being used to follow blood glucose trends and patient was not interested, but did not decline. Future consideration for CGM for temporary use to better assess wide ranges in blood glucose. However, given cost of current medications is a concern will reach out to pharmacy team for cost.  - Will defer use of [facility] PCP assessment.  -  Will follow up on whether the patient is eligible for Extra Help      Follow Up Plan: 2 - 4 weeks   Alexandria Angel, PharmD Clinical Pharmacist Cell: [phone]

## 2024-03-04 NOTE — Progress Notes (Signed)
   03/04/2024  Patient ID: Edd Gong, female   DOB: 04/22/1976, 48 y.o.   MRN: [id number]  Patient was called back for clarification on diabetes regimen and this time she was able to retrieve her insulin . She confirms that she is taking Humalog  60 units three daily INSTEAD of Humalog  75/25 60 units three times daily. She apologies and states that she was under the impression that she was taking the insulin  combination medication as well as basal insulin . She also confirms that she is taking Toujeo  60 units daily.   Additionally, she stated that she continues to be very interested in [place], even after the risks/concerns were explained to the patient again.   I also informed the patient that the endocrinology clinic documented on the referral note that they have reached out to Ms. Haire several times and left voice messages. The patient states that they either didn't leave a message or she didn't see their missed calls. Patient was encouraged to contact the office to get details of the endocrinology clinic.   Alexandria Angel, PharmD Clinical Pharmacist Cell: [phone]

## 2024-03-05 ENCOUNTER — Telehealth: Payer: Self-pay | Admitting: Nurse Practitioner

## 2024-03-05 ENCOUNTER — Other Ambulatory Visit: Payer: Self-pay

## 2024-03-05 ENCOUNTER — Other Ambulatory Visit (HOSPITAL_COMMUNITY): Payer: Self-pay

## 2024-03-05 ENCOUNTER — Other Ambulatory Visit: Payer: Self-pay | Admitting: Internal Medicine

## 2024-03-05 ENCOUNTER — Telehealth: Payer: Self-pay

## 2024-03-05 DIAGNOSIS — Z79899 Other long term (current) drug therapy: Secondary | ICD-10-CM

## 2024-03-05 DIAGNOSIS — Z5986 Financial insecurity: Secondary | ICD-10-CM

## 2024-03-05 MED ORDER — HUMALOG KWIKPEN 200 UNIT/ML ~~LOC~~ SOPN: 60.0000 [IU] | PEN_INJECTOR | Freq: Three times a day (TID) | SUBCUTANEOUS | 0 refills | Status: DC

## 2024-03-05 NOTE — Telephone Encounter (Signed)
 Pharmacy Patient Advocate Encounter  Insurance verification completed.   The patient is insured through [facility] MEDICAID   Ran test claim for toujeo . Currently a quantity of 27ml is a 90 day supply and the co-pay is $4.00 .   This test claim was processed through [facility]- copay amounts may vary at other pharmacies due to pharmacy/plan contracts, or as the patient moves through the different stages of their insurance plan.

## 2024-03-05 NOTE — Progress Notes (Signed)
   03/05/2024  Patient ID: Jill Shaw, female   DOB: 23-Dec-1975, 48 y.o.   MRN: [id number]   At [facility], two medications were provided at no cost ($0), and another medication cost $1.60. However, the pharmacy staff were unable to confirm whether the prescriptions were billed to both Medicare and Medicaid.  At [facility], Gabapentin was billed through Medicare and cost less than $4. The staff stated that they could not bill both Medicare and Medicaid, as Medicaid typically does not cover the Medicare copay. According to the representative, Medicaid will only cover a medication if it is not already covered by Medicare.  At [facility], the most expensive medication was billed via Medicare and cost $4.80.  The patient has both Medicare and Medicaid, so eligibility for Extra Help is automatic. Unfortunately, the patient declined the Social Determinants of Health (SDOH) assessment, which prevented evaluation for potential resources that could assist with the overall cost of her medications, particularly given that she is taking multiple prescriptions.   Alexandria Angel, PharmD Clinical Pharmacist Cell: [phone]

## 2024-03-05 NOTE — Telephone Encounter (Signed)
 Patient was identified as falling into the [place] Measure - Diabetes.   Patient was: Referred to pharmacy for chronic disease management.

## 2024-03-06 ENCOUNTER — Ambulatory Visit: Payer: Self-pay

## 2024-03-06 NOTE — Telephone Encounter (Signed)
 This RN was able to connect with patient. Patient stated she spoke to a pharmacy representative yesterday about [facility]; encounter in chart. Patient stated she was advised against Wegovy because it could impact her blood sugar. Patient wanted to let her PCP know that she is still interested in taking Wegovy, regardless of the potential side effects. Advised that I would route this conversation to provider. Patient verbalized understanding.   Copied from CRM [id number]. Topic: Clinical - Medication Question >> Mar 06, 2024  1:09 PM Stanly Early wrote: Reason for CRM: patient has a few question about taking weight loss medication. Patient stated she agreed to taking the medication and would like for the request to be put in for wegovy. [phone] Reason for Disposition  General information question, no triage required and triager able to answer question  Protocols used: Information Only Call - No Triage-A-AH

## 2024-03-06 NOTE — Telephone Encounter (Signed)
 Sent to PCP. KH

## 2024-03-06 NOTE — Telephone Encounter (Signed)
 Pt advised that she wants to start wegovy . Per the pharmacy she may have some risk and was supposed to reach out to you. Pt would like to know your thought and still wants proceed with the medication. Please advise Kh

## 2024-03-07 ENCOUNTER — Telehealth: Payer: Self-pay | Admitting: Nurse Practitioner

## 2024-03-07 NOTE — Telephone Encounter (Signed)
 Copied from CRM [id number]. Topic: Clinical - Prescription Issue >> Mar 07, 2024  2:18 PM Jill Shaw wrote: Reason for CRM: The patient states that the Pharmacy did not receive her prescription for Dexlansoprazole  30 MG capsule DR. She would like it to be sent to [facility] instead.

## 2024-03-09 ENCOUNTER — Other Ambulatory Visit: Payer: Self-pay | Admitting: Nurse Practitioner

## 2024-03-09 ENCOUNTER — Other Ambulatory Visit: Payer: Self-pay | Admitting: Gastroenterology

## 2024-03-09 DIAGNOSIS — I10 Essential (primary) hypertension: Secondary | ICD-10-CM

## 2024-03-09 DIAGNOSIS — I5032 Chronic diastolic (congestive) heart failure: Secondary | ICD-10-CM

## 2024-03-11 ENCOUNTER — Other Ambulatory Visit: Payer: Self-pay

## 2024-03-11 MED ORDER — METOPROLOL SUCCINATE ER 100 MG PO TB24
100.0000 mg | ORAL_TABLET | Freq: Every day | ORAL | 0 refills | Status: DC
Start: 2024-03-11 — End: 2024-07-21
  Filled 2024-03-11 – 2024-04-21 (×2): qty 90, 90d supply, fill #0

## 2024-03-11 MED ORDER — SUCRALFATE 1 G PO TABS
1.0000 g | ORAL_TABLET | Freq: Three times a day (TID) | ORAL | 3 refills | Status: AC
  Filled 2024-03-11: qty 120, 30d supply, fill #0
  Filled 2024-06-20 (×2): qty 120, 30d supply, fill #1
  Filled 2024-10-16: qty 120, 30d supply, fill #2

## 2024-03-11 MED ORDER — FUROSEMIDE 40 MG PO TABS
40.0000 mg | ORAL_TABLET | Freq: Every day | ORAL | 0 refills | Status: DC
  Filled 2024-03-11 – 2024-04-21 (×2): qty 90, 90d supply, fill #0

## 2024-03-11 MED ORDER — POTASSIUM CHLORIDE CRYS ER 20 MEQ PO TBCR
20.0000 meq | EXTENDED_RELEASE_TABLET | Freq: Every day | ORAL | 0 refills | Status: AC
  Filled 2024-03-11: qty 30, 30d supply, fill #0

## 2024-03-11 MED ORDER — SPIRONOLACTONE 25 MG PO TABS
25.0000 mg | ORAL_TABLET | Freq: Every day | ORAL | 0 refills | Status: DC
  Filled 2024-03-11 – 2024-04-21 (×2): qty 90, 90d supply, fill #0

## 2024-03-11 MED ORDER — HYDRALAZINE HCL 25 MG PO TABS
25.0000 mg | ORAL_TABLET | Freq: Three times a day (TID) | ORAL | 0 refills | Status: DC
Start: 2024-03-11 — End: 2024-05-27
  Filled 2024-03-11: qty 90, 30d supply, fill #0

## 2024-03-11 NOTE — Telephone Encounter (Signed)
 Please advise [facility]

## 2024-03-12 ENCOUNTER — Telehealth: Payer: Self-pay

## 2024-03-12 ENCOUNTER — Other Ambulatory Visit: Payer: Self-pay

## 2024-03-12 ENCOUNTER — Other Ambulatory Visit: Payer: Self-pay | Admitting: Nurse Practitioner

## 2024-03-12 NOTE — Telephone Encounter (Signed)
 Sent to provider

## 2024-03-13 ENCOUNTER — Telehealth: Payer: Self-pay | Admitting: Nurse Practitioner

## 2024-03-13 ENCOUNTER — Other Ambulatory Visit: Payer: Self-pay

## 2024-03-13 DIAGNOSIS — E119 Type 2 diabetes mellitus without complications: Secondary | ICD-10-CM

## 2024-03-13 MED ORDER — HUMALOG KWIKPEN 200 UNIT/ML ~~LOC~~ SOPN
60.0000 [IU] | PEN_INJECTOR | Freq: Three times a day (TID) | SUBCUTANEOUS | 0 refills | Status: DC
Start: 2024-03-13 — End: 2024-03-29
  Filled 2024-03-13 – 2024-03-17 (×2): qty 60, 67d supply, fill #0

## 2024-03-13 NOTE — Telephone Encounter (Signed)
 Patient was identified as falling into the [place] Measure - Diabetes.   Patient was: Appointment already scheduled for:  03/31/24.

## 2024-03-13 NOTE — Progress Notes (Signed)
   03/13/2024  Patient ID: Jill Shaw, female   DOB: 11-08-1975, 48 y.o.   MRN: [id number]  Documentation only - refill request from pharmacy  Alexandria Angel, PharmD Clinical Pharmacist Cell: [phone]

## 2024-03-13 NOTE — Progress Notes (Signed)
   03/13/2024  Patient ID: Jill Shaw, female   DOB: 07-Jul-1976, 48 y.o.   MRN: [id number]  Care Coordination Call  Venita Gilmore sent a refill request for this patient via secure chat for humalog  200 units/mL. Will adjust order for medication to be e-prescribed to pharmacy.    Alexandria Angel, PharmD Clinical Pharmacist Cell: [phone]

## 2024-03-14 ENCOUNTER — Telehealth: Payer: Self-pay

## 2024-03-14 NOTE — Telephone Encounter (Signed)
 Sent to [employer]

## 2024-03-17 ENCOUNTER — Other Ambulatory Visit: Payer: Self-pay

## 2024-03-18 ENCOUNTER — Telehealth: Payer: Self-pay

## 2024-03-18 DIAGNOSIS — Z79899 Other long term (current) drug therapy: Secondary | ICD-10-CM

## 2024-03-18 NOTE — Progress Notes (Signed)
   03/18/2024  Patient ID: Jill Shaw, female   DOB: [birth year], 48 y.o.   MRN: [id number]  I called patient to schedule a follow up appointment. She still remains interested in weight loss medication.   Alexandria Angel, PharmD Clinical Pharmacist Cell: [phone]

## 2024-03-20 ENCOUNTER — Other Ambulatory Visit (HOSPITAL_COMMUNITY): Payer: Self-pay

## 2024-03-20 ENCOUNTER — Other Ambulatory Visit (HOSPITAL_BASED_OUTPATIENT_CLINIC_OR_DEPARTMENT_OTHER): Payer: Self-pay

## 2024-03-20 MED ORDER — OXYCODONE HCL 10 MG PO TABS
10.0000 mg | ORAL_TABLET | Freq: Four times a day (QID) | ORAL | 0 refills | Status: DC | PRN
  Filled 2024-03-20: qty 120, 30d supply, fill #0

## 2024-03-20 MED ORDER — PREGABALIN 100 MG PO CAPS
100.0000 mg | ORAL_CAPSULE | Freq: Three times a day (TID) | ORAL | 0 refills | Status: DC
  Filled 2024-03-20 – 2024-04-02 (×2): qty 90, 30d supply, fill #0

## 2024-03-20 MED ORDER — DICLOFENAC SODIUM 1 % EX GEL
Freq: Four times a day (QID) | CUTANEOUS | 1 refills | Status: DC
  Filled 2024-03-20: qty 300, 9d supply, fill #0

## 2024-03-21 ENCOUNTER — Other Ambulatory Visit (HOSPITAL_COMMUNITY): Payer: Self-pay

## 2024-03-24 ENCOUNTER — Telehealth: Payer: Self-pay | Admitting: Nurse Practitioner

## 2024-03-24 NOTE — Telephone Encounter (Signed)
 Patient was identified as falling into the [place] Measure - Diabetes.   Patient was: Referred to pharmacy for chronic disease management.

## 2024-03-27 ENCOUNTER — Inpatient Hospital Stay (HOSPITAL_COMMUNITY)

## 2024-03-27 ENCOUNTER — Other Ambulatory Visit: Payer: Self-pay

## 2024-03-27 ENCOUNTER — Observation Stay (HOSPITAL_COMMUNITY)
Admission: EM | Admit: 2024-03-27 | Discharge: 2024-03-30 | DRG: 101 | Disposition: A | Discharge status: Home / Self Care | Attending: Internal Medicine | Admitting: Internal Medicine

## 2024-03-27 ENCOUNTER — Emergency Department (HOSPITAL_COMMUNITY)

## 2024-03-27 DIAGNOSIS — E1143 Type 2 diabetes mellitus with diabetic autonomic (poly)neuropathy: Secondary | ICD-10-CM | POA: Diagnosis present

## 2024-03-27 DIAGNOSIS — Z794 Long term (current) use of insulin: Secondary | ICD-10-CM | POA: Diagnosis not present

## 2024-03-27 DIAGNOSIS — E876 Hypokalemia: Secondary | ICD-10-CM | POA: Diagnosis not present

## 2024-03-27 DIAGNOSIS — J4489 Other specified chronic obstructive pulmonary disease: Secondary | ICD-10-CM | POA: Diagnosis present

## 2024-03-27 DIAGNOSIS — X58XXXA Exposure to other specified factors, initial encounter: Secondary | ICD-10-CM | POA: Diagnosis present

## 2024-03-27 DIAGNOSIS — Z888 Allergy status to other drugs, medicaments and biological substances status: Secondary | ICD-10-CM

## 2024-03-27 DIAGNOSIS — D509 Iron deficiency anemia, unspecified: Secondary | ICD-10-CM | POA: Insufficient documentation

## 2024-03-27 DIAGNOSIS — D573 Sickle-cell trait: Secondary | ICD-10-CM | POA: Diagnosis present

## 2024-03-27 DIAGNOSIS — E1122 Type 2 diabetes mellitus with diabetic chronic kidney disease: Secondary | ICD-10-CM | POA: Diagnosis present

## 2024-03-27 DIAGNOSIS — R5381 Other malaise: Secondary | ICD-10-CM | POA: Diagnosis present

## 2024-03-27 DIAGNOSIS — G4733 Obstructive sleep apnea (adult) (pediatric): Secondary | ICD-10-CM | POA: Insufficient documentation

## 2024-03-27 DIAGNOSIS — Z882 Allergy status to sulfonamides status: Secondary | ICD-10-CM

## 2024-03-27 DIAGNOSIS — E1142 Type 2 diabetes mellitus with diabetic polyneuropathy: Secondary | ICD-10-CM | POA: Diagnosis present

## 2024-03-27 DIAGNOSIS — G4089 Other seizures: Principal | ICD-10-CM | POA: Diagnosis present

## 2024-03-27 DIAGNOSIS — N1831 Chronic kidney disease, stage 3a: Secondary | ICD-10-CM | POA: Diagnosis not present

## 2024-03-27 DIAGNOSIS — I2510 Atherosclerotic heart disease of native coronary artery without angina pectoris: Secondary | ICD-10-CM | POA: Diagnosis present

## 2024-03-27 DIAGNOSIS — E1165 Type 2 diabetes mellitus with hyperglycemia: Secondary | ICD-10-CM | POA: Diagnosis not present

## 2024-03-27 DIAGNOSIS — G894 Chronic pain syndrome: Secondary | ICD-10-CM | POA: Diagnosis present

## 2024-03-27 DIAGNOSIS — M79604 Pain in right leg: Secondary | ICD-10-CM | POA: Diagnosis present

## 2024-03-27 DIAGNOSIS — E11649 Type 2 diabetes mellitus with hypoglycemia without coma: Principal | ICD-10-CM | POA: Insufficient documentation

## 2024-03-27 DIAGNOSIS — K3184 Gastroparesis: Secondary | ICD-10-CM | POA: Diagnosis present

## 2024-03-27 DIAGNOSIS — E785 Hyperlipidemia, unspecified: Secondary | ICD-10-CM | POA: Diagnosis present

## 2024-03-27 DIAGNOSIS — S42291A Other displaced fracture of upper end of right humerus, initial encounter for closed fracture: Secondary | ICD-10-CM | POA: Insufficient documentation

## 2024-03-27 DIAGNOSIS — M797 Fibromyalgia: Secondary | ICD-10-CM | POA: Diagnosis present

## 2024-03-27 DIAGNOSIS — J449 Chronic obstructive pulmonary disease, unspecified: Secondary | ICD-10-CM | POA: Diagnosis not present

## 2024-03-27 DIAGNOSIS — I10 Essential (primary) hypertension: Secondary | ICD-10-CM | POA: Diagnosis present

## 2024-03-27 DIAGNOSIS — E1169 Type 2 diabetes mellitus with other specified complication: Secondary | ICD-10-CM | POA: Diagnosis not present

## 2024-03-27 DIAGNOSIS — J4540 Moderate persistent asthma, uncomplicated: Secondary | ICD-10-CM | POA: Diagnosis present

## 2024-03-27 DIAGNOSIS — E662 Morbid (severe) obesity with alveolar hypoventilation: Secondary | ICD-10-CM | POA: Diagnosis not present

## 2024-03-27 DIAGNOSIS — R569 Unspecified convulsions: Secondary | ICD-10-CM | POA: Diagnosis present

## 2024-03-27 DIAGNOSIS — M79605 Pain in left leg: Secondary | ICD-10-CM | POA: Diagnosis present

## 2024-03-27 DIAGNOSIS — E162 Hypoglycemia, unspecified: Principal | ICD-10-CM | POA: Diagnosis present

## 2024-03-27 DIAGNOSIS — I130 Hypertensive heart and chronic kidney disease with heart failure and stage 1 through stage 4 chronic kidney disease, or unspecified chronic kidney disease: Secondary | ICD-10-CM | POA: Diagnosis present

## 2024-03-27 DIAGNOSIS — W19XXXA Unspecified fall, initial encounter: Secondary | ICD-10-CM | POA: Insufficient documentation

## 2024-03-27 DIAGNOSIS — Z79899 Other long term (current) drug therapy: Secondary | ICD-10-CM

## 2024-03-27 DIAGNOSIS — Z886 Allergy status to analgesic agent status: Secondary | ICD-10-CM

## 2024-03-27 DIAGNOSIS — E119 Type 2 diabetes mellitus without complications: Secondary | ICD-10-CM

## 2024-03-27 DIAGNOSIS — I5032 Chronic diastolic (congestive) heart failure: Secondary | ICD-10-CM | POA: Diagnosis not present

## 2024-03-27 DIAGNOSIS — Z6841 Body Mass Index (BMI) 40.0 and over, adult: Secondary | ICD-10-CM | POA: Diagnosis not present

## 2024-03-27 DIAGNOSIS — D649 Anemia, unspecified: Secondary | ICD-10-CM | POA: Diagnosis not present

## 2024-03-27 DIAGNOSIS — E66813 Obesity, class 3: Secondary | ICD-10-CM | POA: Diagnosis present

## 2024-03-27 DIAGNOSIS — F17210 Nicotine dependence, cigarettes, uncomplicated: Secondary | ICD-10-CM | POA: Diagnosis present

## 2024-03-27 DIAGNOSIS — Z8249 Family history of ischemic heart disease and other diseases of the circulatory system: Secondary | ICD-10-CM

## 2024-03-27 DIAGNOSIS — Z7951 Long term (current) use of inhaled steroids: Secondary | ICD-10-CM

## 2024-03-27 DIAGNOSIS — Z7984 Long term (current) use of oral hypoglycemic drugs: Secondary | ICD-10-CM

## 2024-03-27 DIAGNOSIS — S42201A Unspecified fracture of upper end of right humerus, initial encounter for closed fracture: Secondary | ICD-10-CM

## 2024-03-27 DIAGNOSIS — Z833 Family history of diabetes mellitus: Secondary | ICD-10-CM

## 2024-03-27 LAB — CBC WITH DIFFERENTIAL/PLATELET
Abs Immature Granulocytes: 0.07 10*3/uL (ref 0.00–0.07)
Basophils Absolute: 0.1 10*3/uL (ref 0.0–0.1)
Basophils Relative: 1 %
Eosinophils Absolute: 0.2 10*3/uL (ref 0.0–0.5)
Eosinophils Relative: 1 %
HCT: 38.3 % (ref 36.0–46.0)
Hemoglobin: 12.8 g/dL (ref 12.0–15.0)
Immature Granulocytes: 1 %
Lymphocytes Relative: 17 %
Lymphs Abs: 1.9 10*3/uL (ref 0.7–4.0)
MCH: 28.4 pg (ref 26.0–34.0)
MCHC: 33.4 g/dL (ref 30.0–36.0)
MCV: 85.1 fL (ref 80.0–100.0)
Monocytes Absolute: 0.7 10*3/uL (ref 0.1–1.0)
Monocytes Relative: 6 %
Neutro Abs: 8.4 10*3/uL — ABNORMAL HIGH (ref 1.7–7.7)
Neutrophils Relative %: 74 %
Platelets: 152 10*3/uL (ref 150–400)
RBC: 4.5 MIL/uL (ref 3.87–5.11)
RDW: 14.3 % (ref 11.5–15.5)
WBC: 11.2 10*3/uL — ABNORMAL HIGH (ref 4.0–10.5)
nRBC: 0 % (ref 0.0–0.2)

## 2024-03-27 LAB — GLUCOSE, CAPILLARY
Glucose-Capillary: 91 mg/dL (ref 70–99)
Glucose-Capillary: 155 mg/dL — ABNORMAL HIGH (ref 70–99)
Glucose-Capillary: 118 mg/dL — ABNORMAL HIGH (ref 70–99)
Glucose-Capillary: 105 mg/dL — ABNORMAL HIGH (ref 70–99)
Glucose-Capillary: 100 mg/dL — ABNORMAL HIGH (ref 70–99)
Glucose-Capillary: 173 mg/dL — ABNORMAL HIGH (ref 70–99)
Glucose-Capillary: 209 mg/dL — ABNORMAL HIGH (ref 70–99)

## 2024-03-27 LAB — CBG MONITORING, ED
Glucose-Capillary: 69 mg/dL — ABNORMAL LOW (ref 70–99)
Glucose-Capillary: 96 mg/dL (ref 70–99)
Glucose-Capillary: 128 mg/dL — ABNORMAL HIGH (ref 70–99)
Glucose-Capillary: 129 mg/dL — ABNORMAL HIGH (ref 70–99)
Glucose-Capillary: 101 mg/dL — ABNORMAL HIGH (ref 70–99)
Glucose-Capillary: 106 mg/dL — ABNORMAL HIGH (ref 70–99)

## 2024-03-27 LAB — COMPREHENSIVE METABOLIC PANEL WITH GFR
ALT: 8 U/L (ref 0–44)
AST: 20 U/L (ref 15–41)
Albumin: 3.5 g/dL (ref 3.5–5.0)
Alkaline Phosphatase: 60 U/L (ref 38–126)
Anion gap: 15 (ref 5–15)
BUN: 10 mg/dL (ref 6–20)
CO2: 19 mmol/L — ABNORMAL LOW (ref 22–32)
Calcium: 9.5 mg/dL (ref 8.9–10.3)
Chloride: 107 mmol/L (ref 98–111)
Creatinine, Ser: 1.15 mg/dL — ABNORMAL HIGH (ref 0.44–1.00)
GFR, Estimated: 59 mL/min — ABNORMAL LOW (ref >60)
Glucose, Bld: 92 mg/dL (ref 70–99)
Potassium: 3.1 mmol/L — ABNORMAL LOW (ref 3.5–5.1)
Sodium: 141 mmol/L (ref 135–145)
Total Bilirubin: 0.4 mg/dL (ref 0.0–1.2)
Total Protein: 6.2 g/dL — ABNORMAL LOW (ref 6.5–8.1)

## 2024-03-27 LAB — HEMOGLOBIN A1C
Hgb A1c MFr Bld: 9.2 % — ABNORMAL HIGH (ref 4.8–5.6)
Mean Plasma Glucose: 217.34 mg/dL

## 2024-03-27 LAB — CK: Total CK: 118 U/L (ref 38–234)

## 2024-03-27 LAB — MRSA NEXT GEN BY PCR, NASAL: MRSA by PCR Next Gen: NOT DETECTED

## 2024-03-27 MED ORDER — FLUTICASONE FUROATE-VILANTEROL 200-25 MCG/ACT IN AEPB
1.0000 | INHALATION_SPRAY | Freq: Every day | RESPIRATORY_TRACT | Status: DC
  Administered 2024-03-27 – 2024-03-30 (×4): 1 via RESPIRATORY_TRACT
  Filled 2024-03-27: qty 28

## 2024-03-27 MED ORDER — DICYCLOMINE HCL 20 MG PO TABS
20.0000 mg | ORAL_TABLET | Freq: Two times a day (BID) | ORAL | Status: DC
  Administered 2024-03-27 – 2024-03-30 (×7): 20 mg via ORAL
  Filled 2024-03-27 (×8): qty 1

## 2024-03-27 MED ORDER — MORPHINE SULFATE (PF) 2 MG/ML IV SOLN
2.0000 mg | INTRAVENOUS | Status: DC | PRN
  Administered 2024-03-27 – 2024-03-28 (×5): 2 mg via INTRAVENOUS
  Filled 2024-03-27 (×5): qty 1

## 2024-03-27 MED ORDER — FUROSEMIDE 40 MG PO TABS
40.0000 mg | ORAL_TABLET | Freq: Every day | ORAL | Status: DC
  Administered 2024-03-27 – 2024-03-30 (×4): 40 mg via ORAL
  Filled 2024-03-27 (×4): qty 1

## 2024-03-27 MED ORDER — PANTOPRAZOLE SODIUM 40 MG PO TBEC
40.0000 mg | DELAYED_RELEASE_TABLET | Freq: Every day | ORAL | Status: DC
  Administered 2024-03-27 – 2024-03-30 (×4): 40 mg via ORAL
  Filled 2024-03-27 (×4): qty 1

## 2024-03-27 MED ORDER — MELATONIN 5 MG PO TABS
10.0000 mg | ORAL_TABLET | Freq: Every evening | ORAL | Status: DC | PRN
  Administered 2024-03-27 – 2024-03-28 (×2): 10 mg via ORAL
  Filled 2024-03-27 (×2): qty 2

## 2024-03-27 MED ORDER — NITROGLYCERIN 0.4 MG SL SUBL: 0.4000 mg | SUBLINGUAL_TABLET | SUBLINGUAL | Status: DC | PRN

## 2024-03-27 MED ORDER — METOCLOPRAMIDE HCL 10 MG PO TABS: 10.0000 mg | ORAL_TABLET | Freq: Three times a day (TID) | ORAL | Status: DC | PRN

## 2024-03-27 MED ORDER — DOCUSATE SODIUM 100 MG PO CAPS
100.0000 mg | ORAL_CAPSULE | Freq: Two times a day (BID) | ORAL | Status: DC
  Administered 2024-03-27 – 2024-03-30 (×7): 100 mg via ORAL
  Filled 2024-03-27 (×7): qty 1

## 2024-03-27 MED ORDER — DEXTROSE 10 % IV SOLN: INTRAVENOUS | Status: DC

## 2024-03-27 MED ORDER — MORPHINE SULFATE (PF) 2 MG/ML IV SOLN
2.0000 mg | INTRAVENOUS | Status: DC | PRN
  Administered 2024-03-27: 2 mg via INTRAVENOUS
  Filled 2024-03-27: qty 1

## 2024-03-27 MED ORDER — OXYCODONE HCL 5 MG PO TABS
10.0000 mg | ORAL_TABLET | Freq: Once | ORAL | Status: AC
  Administered 2024-03-27: 10 mg via ORAL
  Filled 2024-03-27: qty 2

## 2024-03-27 MED ORDER — LORAZEPAM 2 MG/ML PO CONC
0.5000 mg | Freq: Once | ORAL | Status: AC
  Administered 2024-03-27: 0.5 mg via ORAL
  Filled 2024-03-27: qty 0.25

## 2024-03-27 MED ORDER — METOPROLOL SUCCINATE ER 50 MG PO TB24
100.0000 mg | ORAL_TABLET | Freq: Every day | ORAL | Status: DC
  Administered 2024-03-27 – 2024-03-30 (×4): 100 mg via ORAL
  Filled 2024-03-27 (×4): qty 2

## 2024-03-27 MED ORDER — NICOTINE 21 MG/24HR TD PT24
21.0000 mg | MEDICATED_PATCH | Freq: Every day | TRANSDERMAL | Status: DC
  Administered 2024-03-27 – 2024-03-30 (×4): 21 mg via TRANSDERMAL
  Filled 2024-03-27 (×4): qty 1

## 2024-03-27 MED ORDER — ENOXAPARIN SODIUM 40 MG/0.4ML IJ SOSY
40.0000 mg | PREFILLED_SYRINGE | INTRAMUSCULAR | Status: DC
  Administered 2024-03-27 – 2024-03-30 (×4): 40 mg via SUBCUTANEOUS
  Filled 2024-03-27 (×4): qty 0.4

## 2024-03-27 MED ORDER — OXYCODONE HCL 5 MG PO TABS
10.0000 mg | ORAL_TABLET | Freq: Four times a day (QID) | ORAL | Status: DC | PRN
  Administered 2024-03-27 – 2024-03-29 (×7): 10 mg via ORAL
  Filled 2024-03-27 (×8): qty 2

## 2024-03-27 MED ORDER — PREGABALIN 100 MG PO CAPS
100.0000 mg | ORAL_CAPSULE | Freq: Three times a day (TID) | ORAL | Status: DC
  Administered 2024-03-27 – 2024-03-30 (×10): 100 mg via ORAL
  Filled 2024-03-27 (×10): qty 1

## 2024-03-27 MED ORDER — NALOXONE HCL 4 MG/0.1ML NA LIQD: 1.0000 | Freq: Once | NASAL | Status: DC | PRN

## 2024-03-27 MED ORDER — DEXTROSE 50 % IV SOLN
1.0000 | Freq: Once | INTRAVENOUS | Status: AC
  Administered 2024-03-27: 50 mL via INTRAVENOUS
  Filled 2024-03-27: qty 50

## 2024-03-27 MED ORDER — HYDRALAZINE HCL 20 MG/ML IJ SOLN: 5.0000 mg | INTRAMUSCULAR | Status: DC | PRN

## 2024-03-27 MED ORDER — SUCRALFATE 1 G PO TABS
1.0000 g | ORAL_TABLET | Freq: Three times a day (TID) | ORAL | Status: DC
  Administered 2024-03-27 – 2024-03-30 (×13): 1 g via ORAL
  Filled 2024-03-27 (×15): qty 1

## 2024-03-27 MED ORDER — TIZANIDINE HCL 2 MG PO TABS
6.0000 mg | ORAL_TABLET | Freq: Two times a day (BID) | ORAL | Status: DC | PRN
  Administered 2024-03-27 – 2024-03-29 (×5): 6 mg via ORAL
  Filled 2024-03-27 (×7): qty 3

## 2024-03-27 MED ORDER — HYDRALAZINE HCL 25 MG PO TABS
25.0000 mg | ORAL_TABLET | Freq: Three times a day (TID) | ORAL | Status: DC
  Administered 2024-03-27 – 2024-03-30 (×10): 25 mg via ORAL
  Filled 2024-03-27 (×11): qty 1

## 2024-03-27 MED ORDER — POTASSIUM CHLORIDE CRYS ER 20 MEQ PO TBCR
40.0000 meq | EXTENDED_RELEASE_TABLET | Freq: Four times a day (QID) | ORAL | Status: AC
  Administered 2024-03-27 (×2): 40 meq via ORAL
  Filled 2024-03-27 (×2): qty 2

## 2024-03-27 MED ORDER — BUSPIRONE HCL 5 MG PO TABS
10.0000 mg | ORAL_TABLET | Freq: Two times a day (BID) | ORAL | Status: DC
  Administered 2024-03-27 – 2024-03-30 (×7): 10 mg via ORAL
  Filled 2024-03-27 (×7): qty 2

## 2024-03-27 NOTE — Procedures (Signed)
 Patient Name: Jill Shaw  MRN: [id number]  Epilepsy Attending: Arleene Lack  Referring Physician/Provider: Epifanio Haste, MD  Date: 03/27/2024 Duration: 25.09 mins  Patient history: 48yo F with seizure in the setting of hypoglycemia. EEG to evaluate for seizure.  Level of alertness: Awake  AEDs during EEG study: PGB  Technical aspects: This EEG study was done with scalp electrodes positioned according to the 10-20 International system of electrode placement. Electrical activity was reviewed with band pass filter of 1-70Hz , sensitivity of 7 uV/mm, display speed of 59mm/sec with a 60Hz  notched filter applied as appropriate. EEG data were recorded continuously and digitally stored.  Video monitoring was available and reviewed as appropriate.  Description: The posterior dominant rhythm consists of 8 Hz activity of moderate voltage (25-35 uV) seen predominantly in posterior head regions, symmetric and reactive to eye opening and eye closing. EEG showed intermittent generalized 3 to 6 Hz theta-delta slowing. Physiologic photic driving was not seen during photic stimulation.  Hyperventilation was not performed.     ABNORMALITY - Intermittent slow, generalized  IMPRESSION: This study is suggestive of mild diffuse encephalopathy. No seizures or epileptiform discharges were seen throughout the recording.  Adrik Khim O Vinson Tietze

## 2024-03-27 NOTE — Progress Notes (Signed)
   03/27/24 0911  Vitals  Temp 98.6 F (37 C)  Temp Source Oral  BP 120/70  MAP (mmHg) 85  BP Location Right Wrist  BP Method Automatic  Patient Position (if appropriate) Lying  Pulse Rate 76  Pulse Rate Source Monitor  ECG Heart Rate 78  Resp 16  Level of Consciousness  Level of Consciousness Alert  MEWS COLOR  MEWS Score Color Green  Oxygen Therapy  SpO2 96 %  O2 Device Room Air  PCA/Epidural/Spinal Assessment  Respiratory Pattern Regular;Unlabored;Symmetrical;Dyspnea with exertion  Glasgow Coma Scale  Eye Opening 4  Best Verbal Response (NON-intubated) 5  Best Motor Response 6  Glasgow Coma Scale Score 15  MEWS Score  MEWS Temp 0  MEWS Systolic 0  MEWS Pulse 0  MEWS RR 0  MEWS LOC 0  MEWS Score 0   Pt admitted to unit from ED with all personal belongings via stretcher. Pt A&Ox4, MAEx4, alert and verbally responsive. Skin assessed with second RN per hospital protocol with no pressure injures noted. Tele applied and CCMD called. PIV to right AC clean and dry. Pt's RUE in sling. Pt oriented to unit, room, and call light system. Pt bed in lowest position with call bell within reach and bed alarm on. Dr. Osborne Blazer notified of pt's arrival to unit.

## 2024-03-27 NOTE — ED Notes (Signed)
 Alert, NAD, calm, interactive, c/o R shoulder pain. Mother at [facility]

## 2024-03-27 NOTE — Consult Note (Addendum)
 Reason for Consult:Right humerus fx Referring Physician: Seena Dadds Time called: [id number] Time at bedside: 0928   Jill Shaw is an 48 y.o. female.  HPI: Jill Shaw thinks she probably had a hypoglycemic seizure and fell. When she came to she had significant right shoulder pain. She was brought to the ED where x-rays showed a proximal humerus fx and orthopedic surgery was consulted. She lives with her mom, does not work, and is RHD. She uses a cane or RW at baseline.  Past Medical History:  Diagnosis Date   Abnormal uterine bleeding (AUB)    Arthritis    knees, hands   Atypical chest pain    cardiology--- dr Filiberto Hug did cardiac cath 08-30-2021 showed minimal luminal irregularity involving LAD all other coronaries w/ normal  flow   Chronic diastolic (congestive) heart failure [facility])    cardiologist---- dr Filiberto Hug;  preserved ef   Chronic iron  deficiency anemia    Chronic pain syndrome    followed by pain management--- dr Alessandra Ancona   CKD (chronic kidney disease), stage III (HCC)    Diabetic gastroparesis (HCC)    Diabetic peripheral neuropathy (HCC)    Fibromyalgia    GAD (generalized anxiety disorder)    Generalized abdominal pain    GERD (gastroesophageal reflux disease)    History of acute renal failure 03/21/2023   admission in epic due to N/V/D due ot severe sepsis POA due to UTI   History of chronic gastritis    inflammatory   History of diabetic ketoacidosis    multiple admission's last 3 in epic 06/ 2022;  01/ 2022;   09/ 2021   History of seizure 01/2016   hypoglycemic seizure   History of vertebral compression fracture 12/2020   T11 -- T12 & L1   Hyperlipidemia    Hypertension    followed by pcp   Insulin  dependent type 2 diabetes mellitus (HCC)    uncontrolled,  followed by pcp   Irritable bowel syndrome with constipation    MDD (major depressive disorder)    Moderate COPD (chronic obstructive pulmonary disease) (HCC)    pulmology--- dr Thelda Finney   Moderate  persistent asthma    Sickle cell trait (HCC)    Vitamin D  deficiency 10/2019   Wears glasses     Past Surgical History:  Procedure Laterality Date   CATARACT EXTRACTION W/ INTRAOCULAR LENS IMPLANT Left    CESAREAN SECTION  2001   for twins   DILATION AND CURETTAGE OF UTERUS N/A 08/20/2019   Procedure: DILATATION AND CURETTAGE;  Surgeon: Ana Balling, MD;  Location: [facility] OR;  Service: Gynecology;  Laterality: N/A;   DILATION AND CURETTAGE OF UTERUS  05/01/2023   Procedure: DILATATION AND CURETTAGE;  Surgeon: Kiki Pelton, MD;  Location: [facility];  Service: Gynecology;;   ENDOMETRIAL ABLATION N/A 08/20/2019   Procedure: Liane Redman Ablation;  Surgeon: Ana Balling, MD;  Location: [facility];  Service: Gynecology;  Laterality: N/A;   EYE SURGERY Bilateral    laser right and cataract removed left eye   HYSTEROSCOPY N/A 05/01/2023   Procedure: HYSTEROSCOPY;  Surgeon: Kiki Pelton, MD;  Location: [facility];  Service: Gynecology;  Laterality: N/A;   INTRAUTERINE DEVICE (IUD) INSERTION N/A 05/01/2023   Procedure: INTRAUTERINE DEVICE (IUD) INSERTION;  Surgeon: Kiki Pelton, MD;  Location: [facility];  Service: Gynecology;  Laterality: N/A;   LEFT HEART CATH AND CORONARY ANGIOGRAPHY N/A 08/30/2021   Procedure: LEFT HEART CATH AND CORONARY ANGIOGRAPHY;  Surgeon: Filiberto Hug,  Kaye Parsons, MD;  Location: [facility];  Service: Cardiovascular;  Laterality: N/A;   RADIOLOGY WITH ANESTHESIA N/A 09/16/2019   Procedure: MRI WITH ANESTHESIA   L SPINE WITHOUT CONTRAST, T SPINE WITHOUT CONTRAST , CERVICAL WITHOUT CONTRAST;  Surgeon: Radiologist, Medication, MD;  Location: [facility];  Service: Radiology;  Laterality: N/A;   TOOTH EXTRACTION N/A 01/14/2024   Procedure: DENTAL RESTORATION/EXTRACTIONS;  Surgeon: Ascencion Lava, DMD;  Location: [facility];  Service: Oral Surgery;  Laterality: N/A;   TUBAL LIGATION     interval BTL   UPPER GI ENDOSCOPY  07/2017     Family History  Problem Relation Age of Onset   Diabetes Mother    Hypertension Mother    Colon cancer Maternal Grandfather    Migraines Paternal Grandfather    Colon cancer Maternal Aunt    Breast cancer Maternal Aunt    Esophageal cancer Neg Hx    Stomach cancer Neg Hx    Rectal cancer Neg Hx     Social History:  reports that she has been smoking cigarettes. She started smoking about 29 years ago. She has a 6.5 pack-year smoking history. She has never used smokeless tobacco. She reports that she does not drink alcohol and does not use drugs.  Allergies:  Allergies  Allergen Reactions   Elavil  [Amitriptyline ] Other (See Comments)    Coma   Orudis [Ketoprofen] Nausea And Vomiting   Desyrel  [Trazodone ] Nausea And Vomiting   Tylenol  [Acetaminophen ] Nausea And Vomiting   Ambien  [Zolpidem  Tartrate] Nausea And Vomiting    She also reported headache   Aspirin  Nausea Only   Hydroxyzine  Hcl Rash   Motrin  [Ibuprofen ] Nausea And Vomiting   Naprosyn [Naproxen] Nausea And Vomiting   Neurontin [Gabapentin] Nausea And Vomiting and Other (See Comments)    upset stomach   Sulfa Antibiotics Nausea And Vomiting   Ultram  [Tramadol ] Nausea And Vomiting and Other (See Comments)    stomach upset   Victoza [Liraglutide] Nausea And Vomiting   Zegerid [Omeprazole -Sodium Bicarbonate ] Nausea And Vomiting    Medications: I have reviewed the patient's current medications.  Results for orders placed or performed during the hospital encounter of 03/27/24 (from the past 48 hours)  CBG monitoring, ED     Status: None   Collection Time: 03/27/24  5:39 AM  Result Value Ref Range   Glucose-Capillary 96 70 - 99 mg/dL    Comment: Glucose reference range applies only to samples taken after fasting for at least 8 hours.  Comprehensive metabolic panel     Status: Abnormal   Collection Time: 03/27/24  5:49 AM  Result Value Ref Range   Sodium 141 135 - 145 mmol/L   Potassium 3.1 (L) 3.5 - 5.1 mmol/L    Chloride 107 98 - 111 mmol/L   CO2 19 (L) 22 - 32 mmol/L   Glucose, Bld 92 70 - 99 mg/dL    Comment: Glucose reference range applies only to samples taken after fasting for at least 8 hours.   BUN 10 6 - 20 mg/dL   Creatinine, Ser 4.74 (H) 0.44 - 1.00 mg/dL   Calcium  9.5 8.9 - 10.3 mg/dL   Total Protein 6.2 (L) 6.5 - 8.1 g/dL   Albumin 3.5 3.5 - 5.0 g/dL   AST 20 15 - 41 U/L   ALT 8 0 - 44 U/L   Alkaline Phosphatase 60 38 - 126 U/L   Total Bilirubin 0.4 0.0 - 1.2 mg/dL   GFR, Estimated 59 (L) >60 mL/min  Comment: (NOTE) Calculated using the CKD-EPI Creatinine Equation (2021)    Anion gap 15 5 - 15    Comment: Performed at [facility], [address], [place], [place] [address]  CBC with Differential     Status: Abnormal   Collection Time: 03/27/24  5:49 AM  Result Value Ref Range   WBC 11.2 (H) 4.0 - 10.5 K/uL   RBC 4.50 3.87 - 5.11 MIL/uL   Hemoglobin 12.8 12.0 - 15.0 g/dL   HCT 62.1 30.8 - 65.7 %   MCV 85.1 80.0 - 100.0 fL   MCH 28.4 26.0 - 34.0 pg   MCHC 33.4 30.0 - 36.0 g/dL   RDW 84.6 96.2 - 95.2 %   Platelets 152 150 - 400 K/uL    Comment: REPEATED TO VERIFY   nRBC 0.0 0.0 - 0.2 %   Neutrophils Relative % 74 %   Neutro Abs 8.4 (H) 1.7 - 7.7 K/uL   Lymphocytes Relative 17 %   Lymphs Abs 1.9 0.7 - 4.0 K/uL   Monocytes Relative 6 %   Monocytes Absolute 0.7 0.1 - 1.0 K/uL   Eosinophils Relative 1 %   Eosinophils Absolute 0.2 0.0 - 0.5 K/uL   Basophils Relative 1 %   Basophils Absolute 0.1 0.0 - 0.1 K/uL   Immature Granulocytes 1 %   Abs Immature Granulocytes 0.07 0.00 - 0.07 K/uL    Comment: Performed at [facility], [address], [place], [place] [address]  CBG monitoring, ED     Status: Abnormal   Collection Time: 03/27/24  6:09 AM  Result Value Ref Range   Glucose-Capillary 69 (L) 70 - 99 mg/dL    Comment: Glucose reference range applies only to samples taken after fasting for at least 8 hours.  CBG monitoring, ED     Status: Abnormal    Collection Time: 03/27/24  6:49 AM  Result Value Ref Range   Glucose-Capillary 129 (H) 70 - 99 mg/dL    Comment: Glucose reference range applies only to samples taken after fasting for at least 8 hours.  CBG monitoring, ED     Status: Abnormal   Collection Time: 03/27/24  6:57 AM  Result Value Ref Range   Glucose-Capillary 128 (H) 70 - 99 mg/dL    Comment: Glucose reference range applies only to samples taken after fasting for at least 8 hours.  CBG monitoring, ED     Status: Abnormal   Collection Time: 03/27/24  7:43 AM  Result Value Ref Range   Glucose-Capillary 101 (H) 70 - 99 mg/dL    Comment: Glucose reference range applies only to samples taken after fasting for at least 8 hours.  CBG monitoring, ED     Status: Abnormal   Collection Time: 03/27/24  8:21 AM  Result Value Ref Range   Glucose-Capillary 106 (H) 70 - 99 mg/dL    Comment: Glucose reference range applies only to samples taken after fasting for at least 8 hours.   *Note: Due to a large number of results and/or encounters for the requested time period, some results have not been displayed. A complete set of results can be found in Results Review.    DG Shoulder Right Result Date: 03/27/2024 CLINICAL DATA:  Seizure.  Shoulder pain. EXAM: RIGHT SHOULDER - 2+ VIEW COMPARISON:  None Available. FINDINGS: Exam is markedly limited by patient positioning. Within this limitation findings are compatible with a comminuted humeral neck fracture. No evidence for shoulder dislocation or separation. IMPRESSION:  Comminuted fracture of the proximal humerus, in the region of the anatomical neck. Electronically Signed   By: Donnal Fusi M.D.   On: 03/27/2024 06:37    Review of Systems  HENT:  Negative for ear discharge, ear pain, hearing loss and tinnitus.   Eyes:  Negative for photophobia and pain.  Respiratory:  Negative for cough and shortness of breath.   Cardiovascular:  Negative for chest pain.  Gastrointestinal:  Negative for  abdominal pain, nausea and vomiting.  Genitourinary:  Negative for dysuria, flank pain, frequency and urgency.  Musculoskeletal:  Positive for arthralgias (Right shoulder). Negative for back pain, myalgias and neck pain.  Neurological:  Negative for dizziness and headaches.  Hematological:  Does not bruise/bleed easily.  Psychiatric/Behavioral:  The patient is not nervous/anxious.    Blood pressure 120/70, pulse 76, temperature 98.6 F (37 C), temperature source Oral, resp. rate 16, height 5' 4 (1.626 m), weight 131.9 kg, SpO2 96%. Physical Exam Constitutional:      General: She is not in acute distress.    Appearance: She is well-developed. She is not diaphoretic.  HENT:     Head: Normocephalic and atraumatic.   Eyes:     General: No scleral icterus.       Right eye: No discharge.        Left eye: No discharge.     Conjunctiva/sclera: Conjunctivae normal.    Cardiovascular:     Rate and Rhythm: Normal rate and regular rhythm.  Pulmonary:     Effort: Pulmonary effort is normal. No respiratory distress.   Musculoskeletal:     Cervical back: Normal range of motion.     Comments: Right shoulder, elbow, wrist, digits- no skin wounds, mod TTP shoulder, no instability, no blocks to motion  Sens  Ax/R/M/U intact  Mot   Ax/ R/ PIN/ M/ AIN/ U intact  Rad 1+   Skin:    General: Skin is warm and dry.   Neurological:     Mental Status: She is alert.   Psychiatric:        Mood and Affect: Mood normal.        Behavior: Behavior normal.     Assessment/Plan: Right humerus fx -- Plan non-operative management with sling and NWB. F/u with Dr. Abigail Abler in 2 weeks.    Georganna Kin, PA-C Orthopedic Surgery [phone] 03/27/2024, 9:44 AM

## 2024-03-27 NOTE — Progress Notes (Signed)
 Orthopedic Tech Progress Note Patient Details:  Jill Shaw 1976/08/29 [id number]  Ortho Devices Type of Ortho Device: Arm sling Ortho Device/Splint Location: RUE Ortho Device/Splint Interventions: Ordered, Application   Post Interventions Patient Tolerated: Well  Emilya Justen A Mylynn Dinh 03/27/2024, 9:20 AM

## 2024-03-27 NOTE — Progress Notes (Signed)
 Eeg complete, results are pending

## 2024-03-27 NOTE — H&P (Signed)
 [facility] H&P   Patient Demographics:    Jill Shaw, is a 48 y.o. female  MRN: [id number]   DOB - [birth year]  Admit Date - 03/27/2024  Outpatient Primary MD for the patient is [name], NP  Referring MD/NP/PA: Dr. Dolan Freiberg  Patient coming from: Home  Chief Complaint  Patient presents with   Hypoglycemia   Seizures      HPI:    Jill Shaw  is a 48 y.o. female, with PMH DM, gastroparesis, chronic pain on daily opiates, HTN, COPD, dCHF, CKD IIIa, OHS, with recent admission last April secondary to acute on chronic diastolic CHF . - Patient admitted secondary to seizures and hypoglycemia, obtained from mother by ED physician, mother reported she had hears a strange noise from patient's room, patient lives with her mother at home, the patient, and found her to be having seizures, she reports history of seizures in the past secondary to hypoglycemia, she was not seen by neurologist for that or had any AED, EMS were called, patient was blood sugar was found to be 34, they administered 25 g of D10 with some improvement of her CBG and alertness, seizure has stopped, patient reports she takes her insulin  as prescribed, no change in appetite habits, she has been complaining of pain in the right shoulder while in ED. - ED workup significant for low potassium at 3.1, creatinine better than baseline at 1.15, blood cell count mildly elevated at 11.2, right shoulder x-ray significant for right humerus fracture, BG has improved after multiple D50 pushes, but eventually she required D10 W at 100 cc/h stabilize her blood sugar, [facility]  hospitalist consulted to admit.    Review of systems:      A full 10 point Review of Systems was done, except as stated above, all other Review of Systems were negative.   With Past History of the following :    Past Medical History:  Diagnosis Date   Abnormal  uterine bleeding (AUB)    Arthritis    knees, hands   Atypical chest pain    cardiology--- dr Filiberto Hug did cardiac cath 08-30-2021 showed minimal luminal irregularity involving LAD all other coronaries w/ normal  flow   Chronic diastolic (congestive) heart failure [facility])    cardiologist---- dr Filiberto Hug;  preserved ef   Chronic iron  deficiency anemia    Chronic pain syndrome    followed by pain management--- dr Alessandra Ancona   CKD (chronic kidney disease), stage III (HCC)    Diabetic gastroparesis (HCC)    Diabetic peripheral neuropathy (HCC)    Fibromyalgia    GAD (generalized anxiety disorder)    Generalized abdominal pain    GERD (gastroesophageal reflux disease)    History of acute renal failure 03/21/2023   admission in epic due to N/V/D due ot severe sepsis POA due to UTI   History of chronic gastritis    inflammatory  History of diabetic ketoacidosis    multiple admission's last 3 in epic 06/ 2022;  01/ 2022;   09/ 2021   History of seizure 01/2016   hypoglycemic seizure   History of vertebral compression fracture 12/2020   T11 -- T12 & L1   Hyperlipidemia    Hypertension    followed by pcp   Insulin  dependent type 2 diabetes mellitus (HCC)    uncontrolled,  followed by pcp   Irritable bowel syndrome with constipation    MDD (major depressive disorder)    Moderate COPD (chronic obstructive pulmonary disease) (HCC)    pulmology--- dr Thelda Finney   Moderate persistent asthma    Sickle cell trait (HCC)    Vitamin D  deficiency 10/2019   Wears glasses       Past Surgical History:  Procedure Laterality Date   CATARACT EXTRACTION W/ INTRAOCULAR LENS IMPLANT Left    CESAREAN SECTION  2001   for twins   DILATION AND CURETTAGE OF UTERUS N/A 08/20/2019   Procedure: DILATATION AND CURETTAGE;  Surgeon: Ana Balling, MD;  Location: [facility] OR;  Service: Gynecology;  Laterality: N/A;   DILATION AND CURETTAGE OF UTERUS  05/01/2023   Procedure: DILATATION AND CURETTAGE;  Surgeon: Kiki Pelton, MD;  Location: [facility];  Service: Gynecology;;   ENDOMETRIAL ABLATION N/A 08/20/2019   Procedure: Liane Redman Ablation;  Surgeon: Ana Balling, MD;  Location: [facility] OR;  Service: Gynecology;  Laterality: N/A;   EYE SURGERY Bilateral    laser right and cataract removed left eye   HYSTEROSCOPY N/A 05/01/2023   Procedure: HYSTEROSCOPY;  Surgeon: Kiki Pelton, MD;  Location: [facility];  Service: Gynecology;  Laterality: N/A;   INTRAUTERINE DEVICE (IUD) INSERTION N/A 05/01/2023   Procedure: INTRAUTERINE DEVICE (IUD) INSERTION;  Surgeon: Kiki Pelton, MD;  Location:  SURGERY CENTER;  Service: Gynecology;  Laterality: N/A;   LEFT HEART CATH AND CORONARY ANGIOGRAPHY N/A 08/30/2021   Procedure: LEFT HEART CATH AND CORONARY ANGIOGRAPHY;  Surgeon: Cody Das, MD;  Location: [facility];  Service: Cardiovascular;  Laterality: N/A;   RADIOLOGY WITH ANESTHESIA N/A 09/16/2019   Procedure: MRI WITH ANESTHESIA   L SPINE WITHOUT CONTRAST, T SPINE WITHOUT CONTRAST , CERVICAL WITHOUT CONTRAST;  Surgeon: Radiologist, Medication, MD;  Location: [facility] OR;  Service: Radiology;  Laterality: N/A;   TOOTH EXTRACTION N/A 01/14/2024   Procedure: DENTAL RESTORATION/EXTRACTIONS;  Surgeon: Ascencion Lava, DMD;  Location: [facility] OR;  Service: Oral Surgery;  Laterality: N/A;   TUBAL LIGATION     interval BTL   UPPER GI ENDOSCOPY  07/2017      Social History:     Social History   Tobacco Use   Smoking status: Some Days    Current packs/day: 0.00    Average packs/day: 0.3 packs/day for 26.0 years (6.5 ttl pk-yrs)    Types: Cigarettes    Start date: 09/13/1994    Last attempt to quit: 09/13/2020    Years since quitting: 3.5   Smokeless tobacco: Never   Tobacco comments:    Pt smokes 1/2 ppd. AB. CMA 09-10-23  Substance Use Topics   Alcohol use: No       Family History :     Family History  Problem Relation Age of Onset   Diabetes Mother     Hypertension Mother    Colon cancer Maternal Grandfather    Migraines Paternal Grandfather    Colon cancer Maternal Aunt    Breast cancer Maternal  Aunt    Esophageal cancer Neg Hx    Stomach cancer Neg Hx    Rectal cancer Neg Hx       Home Medications:   Prior to Admission medications   Medication Sig Start Date End Date Taking? Authorizing Provider  albuterol  (PROVENTIL ) (2.5 MG/3ML) 0.083% nebulizer solution Take 3 mLs (2.5 mg total) by nebulization every 6 (six) hours as needed for wheezing or shortness of breath. 01/31/24 01/30/25 Yes Parrett, Tammy S, NP  albuterol  (VENTOLIN  HFA) 108 (90 Base) MCG/ACT inhaler Inhale 2 puffs into the lungs every 6 (six) hours as needed for wheezing or shortness of breath. 11/26/23  Yes [name], NP  budesonide -formoterol  (SYMBICORT ) 160-4.5 MCG/ACT inhaler Inhale 2 puffs into the lungs 2 (two) times daily. 11/26/23  Yes [name], NP  busPIRone  (BUSPAR ) 10 MG tablet Take 1 tablet (10 mg total) by mouth 2 (two) times daily. 03/03/24  Yes [name], NP  Dexlansoprazole  30 MG capsule DR Take 1 capsule (30 mg total) by mouth daily. NEEDS OFFICE VISIT FOR ADDITIONAL REFILLS 02/29/24  Yes [name], NP  diclofenac  Sodium (VOLTAREN ) 1 % GEL Apply 2 to 4 gram to painful sites up to 4 times daily if needed, max daily dose: 32 Gram 02/19/24  Yes   dicyclomine  (BENTYL ) 20 MG tablet Take 1 tablet (20 mg total) by mouth 2 (two) times daily. 11/26/23  Yes [name], NP  empagliflozin  (JARDIANCE ) 10 MG TABS tablet Take 1 tablet (10 mg total) by mouth daily. 11/26/23  Yes [name], NP  furosemide  (LASIX ) 40 MG tablet Take 1 tablet (40 mg total) by mouth daily. 03/11/24  Yes [name], NP  hydrALAZINE  (APRESOLINE ) 25 MG tablet Take 1 tablet (25 mg total) by mouth 3 (three) times daily. 03/11/24  Yes [name], NP  ibuprofen  (ADVIL ) 800 MG tablet Take 800 mg by mouth 2 (two) times daily as needed for moderate pain (pain  score 4-6).   Yes [provider]  insulin  lispro (HUMALOG  KWIKPEN) 200 UNIT/ML KwikPen Inject 60 Units into the skin 3 (three) times daily before meals. 03/13/24  Yes Nichols, Tonya S, NP  levocetirizine (XYZAL) 5 MG tablet Take 5 mg by mouth every evening.   Yes [provider]  Melatonin 10 MG TABS Take 10 mg by mouth at bedtime as needed (insomnia). 02/07/24  Yes Faith Homes, MD  metoCLOPramide  (REGLAN ) 10 MG tablet Take 1 tablet (10 mg total) by mouth every 8 (eight) hours as needed for nausea. 10/07/23  Yes Rancour, Mara Seminole, MD  metoprolol  succinate (TOPROL -XL) 100 MG 24 hr tablet Take 1 tablet (100 mg total) by mouth daily. Take with or immediately following a meal. 03/11/24  Yes [name], NP  naloxone  (NARCAN ) nasal spray 4 mg/0.1 mL Insert 1 spray into one nostril if poorly responding / turning blue. CALL 911 ASAP 10/13/23  Yes   nitroGLYCERIN  (NITROSTAT ) 0.4 MG SL tablet Place 1 tablet (0.4 mg total) under the tongue every 5 (five) minutes as needed for chest pain. Additional refills to be filled by PCP, patient aware 09/06/23  Yes [name], NP  norethindrone  (AYGESTIN ) 5 MG tablet Take 1 tablet (5 mg total) by mouth 2 (two) times daily as needed (bleeding). Patient taking differently: Take 5 mg by mouth 2 (two) times daily. 02/21/24  Yes Ajewole, Christana, MD  ondansetron  (ZOFRAN -ODT) 4 MG disintegrating tablet Take 1 tablet (4 mg total) by mouth every 8 (eight)  hours as needed for nausea or vomiting. 12/01/23  Yes Barrett, Kandace Organ, PA-C  Oxycodone  HCl 10 MG TABS Take 1 tablet (10 mg total) by mouth 4 (four) times daily as needed. Patient taking differently: Take 10 mg by mouth 4 (four) times daily. 03/20/24  Yes   potassium chloride  SA (KLOR-CON  M) 20 MEQ tablet Take 1 tablet (20 mEq total) by mouth daily. 03/11/24  Yes [name], NP  pregabalin  (LYRICA ) 100 MG capsule Take 1 (one) capsule by mouth three times daily, Decrease to twice daily if feel off  balance or overmedicated. 03/20/24  Yes   rosuvastatin  (CRESTOR ) 20 MG tablet TAKE 1 TABLET(20 MG) BY MOUTH DAILY 01/18/24  Yes Patwardhan, Manish J, MD  spironolactone  (ALDACTONE ) 25 MG tablet Take 1 tablet (25 mg total) by mouth daily. 03/11/24  Yes [name], NP  sucralfate  (CARAFATE ) 1 g tablet Take 1 tablet (1 g total) by mouth 4 (four) times daily -  with meals and at bedtime. 03/11/24  Yes Zehr, Jessica D, PA-C  tizanidine  (ZANAFLEX ) 6 MG capsule Take 6 mg by mouth 2 (two) times daily as needed for muscle spasms. 03/03/24  Yes [provider]  TOUJEO  MAX SOLOSTAR 300 UNIT/ML Solostar Pen Inject 60 Units into the skin in the morning. 10/24/23  Yes [name], NP  diclofenac  Sodium (VOLTAREN ) 1 % GEL Apply 2 to 4 grams topically to painful sites up to 4 (four) times daily if needed.  Max daily dose 32 grams. Patient not taking: Reported on 03/27/2024 03/20/24        Allergies:     Allergies  Allergen Reactions   Elavil  [Amitriptyline ] Other (See Comments)    Coma   Orudis [Ketoprofen] Nausea And Vomiting   Desyrel  [Trazodone ] Nausea And Vomiting   Tylenol  [Acetaminophen ] Nausea And Vomiting   Ambien  [Zolpidem  Tartrate] Nausea And Vomiting    She also reported headache   Aspirin  Nausea Only   Hydroxyzine  Hcl Rash   Motrin  [Ibuprofen ] Nausea And Vomiting   Naprosyn [Naproxen] Nausea And Vomiting   Neurontin [Gabapentin] Nausea And Vomiting and Other (See Comments)    upset stomach   Sulfa Antibiotics Nausea And Vomiting   Ultram  [Tramadol ] Nausea And Vomiting and Other (See Comments)    stomach upset   Victoza [Liraglutide] Nausea And Vomiting   Zegerid [Omeprazole -Sodium Bicarbonate ] Nausea And Vomiting     Physical Exam:   Vitals  Blood pressure (!) 146/74, pulse 83, temperature 97.7 F (36.5 C), temperature source Oral, resp. rate 17, height 5' 4 (1.626 m), weight 131.9 kg, SpO2 100%.   1. General Well-developed female, with morbid obesity, in mild  discomfort due to pain  2. Normal affect and insight, Not Suicidal or Homicidal, Awake Alert, Oriented X 3.  3. No F.N deficits, ALL C.Nerves Intact, Strength 5/5 all 4 extremities, Sensation intact all 4 extremities, Plantars down going.  4. Ears and Eyes appear Normal, Conjunctivae clear, PERRLA. Moist Oral Mucosa.  5. Supple Neck, No JVD, No cervical lymphadenopathy appriciated, No Carotid Bruits.  6. Symmetrical Chest wall movement, Good air movement bilaterally, CTAB.  7. RRR, No Gallops, Rubs or Murmurs, No Parasternal Heave.  8. Positive Bowel Sounds, Abdomen Soft, No tenderness, No organomegaly appriciated,No rebound -guarding or rigidity.  9.  No Cyanosis, Normal Skin Turgor, No Skin Rash or Bruise.  10.  Right upper extremity movement limited due to pain    Data Review:    CBC Recent Labs  Lab 03/27/24 0549  WBC 11.2*  HGB 12.8  HCT 38.3  PLT 152  MCV 85.1  MCH 28.4  MCHC 33.4  RDW 14.3  LYMPHSABS 1.9  MONOABS 0.7  EOSABS 0.2  BASOSABS 0.1   ------------------------------------------------------------------------------------------------------------------  Chemistries  Recent Labs  Lab 03/27/24 0549  NA 141  K 3.1*  CL 107  CO2 19*  GLUCOSE 92  BUN 10  CREATININE 1.15*  CALCIUM  9.5  AST 20  ALT 8  ALKPHOS 60  BILITOT 0.4   ------------------------------------------------------------------------------------------------------------------ estimated creatinine clearance is 81.7 mL/min (A) (by C-G formula based on SCr of 1.15 mg/dL (H)). ------------------------------------------------------------------------------------------------------------------ No results for input(s): TSH, T4TOTAL, T3FREE, THYROIDAB in the last 72 hours.  Invalid input(s): FREET3  Coagulation profile No results for input(s): INR, PROTIME in the last 168  hours. ------------------------------------------------------------------------------------------------------------------- No results for input(s): DDIMER in the last 72 hours. -------------------------------------------------------------------------------------------------------------------  Cardiac Enzymes No results for input(s): CKMB, TROPONINI, MYOGLOBIN in the last 168 hours.  Invalid input(s): CK ------------------------------------------------------------------------------------------------------------------    Component Value Date/Time   BNP 178.4 (H) 02/04/2024 0239   BNP 67.5 03/30/2017 1438     ---------------------------------------------------------------------------------------------------------------  Urinalysis    Component Value Date/Time   COLORURINE COLORLESS (A) 02/04/2024 0906   APPEARANCEUR CLEAR 02/04/2024 0906   LABSPEC 1.011 02/04/2024 0906   PHURINE 5.0 02/04/2024 0906   GLUCOSEU 50 (A) 02/04/2024 0906   HGBUR NEGATIVE 02/04/2024 0906   BILIRUBINUR NEGATIVE 02/04/2024 0906   BILIRUBINUR negative 11/26/2023 1206   BILIRUBINUR NEGATIVE 11/05/2019 1342   KETONESUR NEGATIVE 02/04/2024 0906   PROTEINUR NEGATIVE 02/04/2024 0906   UROBILINOGEN 0.2 11/26/2023 1206   UROBILINOGEN 0.2 11/12/2020 1148   NITRITE NEGATIVE 02/04/2024 0906   LEUKOCYTESUR NEGATIVE 02/04/2024 0906    ----------------------------------------------------------------------------------------------------------------   Imaging Results:    DG Shoulder Right Result Date: 03/27/2024 CLINICAL DATA:  Seizure.  Shoulder pain. EXAM: RIGHT SHOULDER - 2+ VIEW COMPARISON:  None Available. FINDINGS: Exam is markedly limited by patient positioning. Within this limitation findings are compatible with a comminuted humeral neck fracture. No evidence for shoulder dislocation or separation. IMPRESSION: Comminuted fracture of the proximal humerus, in the region of the anatomical neck.  Electronically Signed   By: Donnal Fusi M.D.   On: 03/27/2024 06:37      Assessment & Plan:    Active Problems:   Obesity hypoventilation syndrome (HCC)   Essential hypertension   Obesity, Class III, BMI 40-49.9 (morbid obesity)   Hypoglycemia   Physical deconditioning   Type 2 diabetes mellitus (HCC)   Seizure (HCC)  Seizures - This is in the setting of hypoglycemia, she reports history of seizures in the past with only hypoglycemia. - As discussed with neurology, no indication for AED given his propoxycaine years, but recommendation to check MRI brain and EEG will keep. - Continue with seizure precautions.  Hypoglycemia - He is on significantly high amount of insulin , I will hold for now, including Jardiance  as well - Continue with D10 with close CBG monitoring every 2 hours.  Diabetes mellitus, poorly controlled, with hypoglycemia and hyperglycemia - Will recheck A1c - Hold insulin  and Jardiance  for now given her hyperglycemia - Diabetic coordinator consult placed. - Once CBG started to increase we will hold her D10 and start on low-dose long-acting insulin  and insulin  sliding scale  Chronic diastolic CHF - Appears euvolemic, but given she is on significant amount of D10W we will closely for volume overload - Will resume home dose Lasix .  As needed Lasix  as well  CKD stage IIIa -avoid  nephrotoxic medications  Right humerus commuted fracture -Secondary to seizures, Ortho consulted   Chronic pain syndrome Patient has chronic back pain from a vertebral compression fracture, and chronic knee arthritis and neuropathy. - Continue with home dose plan oxycodone  and Lyrica  - She is on as needed morphine  as well for acute pain secondary to humerus fracture.   Concern for OSA/obesity hypoventilation syndrome (HCC) -Patient reports she had sleep study as an outpatient recently, and still being evaluated for CPAP versus BiPAP   Diabetic gastroparesis (HCC) - Try to minimize  narcotics. - Reglan  as needed   Chronic obstructive pulmonary disease (HCC) - Continue ICS/LABA   Morbid obesity -Body mass index is 49.91 kg/m.    Essential hypertension - resume home regimen, will add as needed hydralazine   Hyperlipedemia - Resume statin after checking total CK  Hypokalemia - Repleted  DVT Prophylaxis  Lovenox    AM Labs Ordered, also please review Full Orders  Family Communication: Admission, patients condition and plan of care including tests being ordered have been discussed with the patient who indicate understanding and agree with the plan and Code Status.  Code Status   Likely DC to pending PT OT evaluation  Consults called: Orthopedic consulted, discussed with neurology by phone  Admission status: Inpatient  Time spent in minutes : 70 minutes   Seena Dadds M.D on 03/27/2024 at 9:06 AM   [facility] - Office  [phone]

## 2024-03-27 NOTE — ED Triage Notes (Signed)
 Pt bib [facility] from home. Pt mom called out for seizure. Mom reports patient only seizes when blood sugar is low. EMS cbg 34. EMS witness tonic clonic seizure that lasted approximately 1 minute. Pt has not had anything to eat since dinner time. Per ems pt is a&OX1.   18g RAC   EMS VS 167/90 90 97 % RA    EMS gave 25g D10.

## 2024-03-27 NOTE — ED Notes (Signed)
 Ortho tech at [facility]. Pain med given. Sling being applied.

## 2024-03-27 NOTE — Progress Notes (Signed)
 1.5mg  of ativan  suspension wasted in waste bin in med room. Penelope Bowie, RN witness.

## 2024-03-27 NOTE — ED Provider Notes (Signed)
 Emergency Department Provider Note   I have reviewed the triage vital signs and the nursing notes.   HISTORY  Chief Complaint Hypoglycemia and Seizures   HPI Jill Shaw is a 48 y.o. female with past history of CKD, diabetes, CHF presents to the emergency department with hypoglycemia and witnessed seizure.  Patient's mom at bedside reports that she was up early for work at home and heard a strange noise coming from the next room.  She checked on the patient and found her to be having a seizure.  She has had these in the past due to low blood sugar.  She was not alert enough to drink or eat and so she called EMS.  They arrived to find the patient with a blood sugar of 34.  They administered 25 g of D10 with some improvement in CBG and alertness.  Seizure stopped.  Patient does not take antiepileptic medication.  No falls or head trauma.  Patient states she takes her insulin  as prescribed and has been eating/drinking as normal. Notes pain in the right shoulder.    Past Medical History:  Diagnosis Date   Abnormal uterine bleeding (AUB)    Arthritis    knees, hands   Atypical chest pain    cardiology--- dr Filiberto Hug did cardiac cath 08-30-2021 showed minimal luminal irregularity involving LAD all other coronaries w/ normal  flow   Chronic diastolic (congestive) heart failure [facility])    cardiologist---- dr Filiberto Hug;  preserved ef   Chronic iron  deficiency anemia    Chronic pain syndrome    followed by pain management--- dr Alessandra Ancona   CKD (chronic kidney disease), stage III (HCC)    Diabetic gastroparesis (HCC)    Diabetic peripheral neuropathy (HCC)    Fibromyalgia    GAD (generalized anxiety disorder)    Generalized abdominal pain    GERD (gastroesophageal reflux disease)    History of acute renal failure 03/21/2023   admission in epic due to N/V/D due ot severe sepsis POA due to UTI   History of chronic gastritis    inflammatory   History of diabetic ketoacidosis     multiple admission's last 3 in epic 06/ 2022;  01/ 2022;   09/ 2021   History of seizure 01/2016   hypoglycemic seizure   History of vertebral compression fracture 12/2020   T11 -- T12 & L1   Hyperlipidemia    Hypertension    followed by pcp   Insulin  dependent type 2 diabetes mellitus (HCC)    uncontrolled,  followed by pcp   Irritable bowel syndrome with constipation    MDD (major depressive disorder)    Moderate COPD (chronic obstructive pulmonary disease) (HCC)    pulmology--- dr Thelda Finney   Moderate persistent asthma    Sickle cell trait (HCC)    Vitamin D  deficiency 10/2019   Wears glasses     Review of Systems  Constitutional: No fever/chills Cardiovascular: Denies chest pain. Respiratory: Denies shortness of breath. Gastrointestinal: No abdominal pain.  No nausea, no vomiting.   Musculoskeletal: Positive right shoulder pain.  Skin: Negative for rash. Neurological: Negative for headaches. Positive seizure.    ____________________________________________   PHYSICAL EXAM:  VITAL SIGNS: ED Triage Vitals  Encounter Vitals Group     BP 03/27/24 0544 (!) 148/75     Pulse Rate 03/27/24 0544 91     Resp 03/27/24 0544 15     Temp 03/27/24 0544 97.7 F (36.5 C)     Temp Source 03/27/24 0544  Oral     SpO2 03/27/24 0544 93 %     Weight 03/27/24 0541 290 lb 12.6 oz (131.9 kg)     Height 03/27/24 0541 5' 4 (1.626 m)   Constitutional: Alert and oriented. Well appearing and in no acute distress. Eyes: Conjunctivae are normal. PERRL.  Head: Atraumatic. Nose: No congestion/rhinnorhea. Mouth/Throat: Mucous membranes are moist.  Oropharynx non-erythematous. No large tongue laceration.  Neck: No stridor.   Cardiovascular: Normal rate, regular rhythm. Good peripheral circulation. Grossly normal heart sounds.   Respiratory: Normal respiratory effort.  No retractions. Lungs CTAB. Gastrointestinal: Soft and nontender. No distention.  Musculoskeletal: No lower extremity tenderness  nor edema. No gross deformities of extremities. Pain with attempted ROM of the right shoulder.  Neurologic:  Normal speech and language. No gross focal neurologic deficits are appreciated.  Skin:  Skin is warm, dry and intact. No rash noted.  ____________________________________________   LABS (all labs ordered are listed, but only abnormal results are displayed)  Labs Reviewed  COMPREHENSIVE METABOLIC PANEL WITH GFR - Abnormal; Notable for the following components:      Result Value   Potassium 3.1 (*)    CO2 19 (*)    Creatinine, Ser 1.15 (*)    Total Protein 6.2 (*)    GFR, Estimated 59 (*)    All other components within normal limits  CBC WITH DIFFERENTIAL/PLATELET - Abnormal; Notable for the following components:   WBC 11.2 (*)    Neutro Abs 8.4 (*)    All other components within normal limits  HEMOGLOBIN A1C - Abnormal; Notable for the following components:   Hgb A1c MFr Bld 9.2 (*)    All other components within normal limits  GLUCOSE, CAPILLARY - Abnormal; Notable for the following components:   Glucose-Capillary 155 (*)    All other components within normal limits  GLUCOSE, CAPILLARY - Abnormal; Notable for the following components:   Glucose-Capillary 118 (*)    All other components within normal limits  GLUCOSE, CAPILLARY - Abnormal; Notable for the following components:   Glucose-Capillary 105 (*)    All other components within normal limits  GLUCOSE, CAPILLARY - Abnormal; Notable for the following components:   Glucose-Capillary 100 (*)    All other components within normal limits  GLUCOSE, CAPILLARY - Abnormal; Notable for the following components:   Glucose-Capillary 173 (*)    All other components within normal limits  GLUCOSE, CAPILLARY - Abnormal; Notable for the following components:   Glucose-Capillary 209 (*)    All other components within normal limits  GLUCOSE, CAPILLARY - Abnormal; Notable for the following components:   Glucose-Capillary 215 (*)     All other components within normal limits  CBG MONITORING, ED - Abnormal; Notable for the following components:   Glucose-Capillary 69 (*)    All other components within normal limits  CBG MONITORING, ED - Abnormal; Notable for the following components:   Glucose-Capillary 129 (*)    All other components within normal limits  CBG MONITORING, ED - Abnormal; Notable for the following components:   Glucose-Capillary 128 (*)    All other components within normal limits  CBG MONITORING, ED - Abnormal; Notable for the following components:   Glucose-Capillary 101 (*)    All other components within normal limits  CBG MONITORING, ED - Abnormal; Notable for the following components:   Glucose-Capillary 106 (*)    All other components within normal limits  MRSA NEXT GEN BY PCR, NASAL  CK  GLUCOSE, CAPILLARY  BASIC  METABOLIC PANEL WITH GFR  CBC  CBG MONITORING, ED   ____________________________________________  EKG   EKG Interpretation Date/Time:  Thursday March 27 2024 05:43:18 EDT Ventricular Rate:  94 PR Interval:  161 QRS Duration:  95 QT Interval:  374 QTC Calculation: 468 R Axis:   33  Text Interpretation: Sinus rhythm Low voltage, precordial leads Consider anterior infarct Nonspecific T abnormalities, lateral leads Confirmed by Abby Hocking [phone]) on 03/27/2024 6:19:22 AM        ____________________________________________  RADIOLOGY  MR BRAIN WO CONTRAST Result Date: 03/27/2024 CLINICAL DATA:  Initial evaluation for acute seizure. EXAM: MRI HEAD WITHOUT CONTRAST TECHNIQUE: Multiplanar, multiecho pulse sequences of the brain and surrounding structures were obtained without intravenous contrast. COMPARISON:  Comparison made with CT from 02/04/2024 and brain MRI from 01/30/2020. FINDINGS: Brain: Cerebral volume within normal limits for age. Few patchy foci of T2/FLAIR hyperintensity noted involving the periventricular deep white matter both cerebral hemispheres,  nonspecific, but most commonly related to chronic microvascular ischemic disease. Changes are mild in nature. These changes are new and/or progressed from prior brain MRI from 2021. No abnormal foci of restricted diffusion to suggest acute or subacute ischemia. Gray-white matter differentiation well maintained. No encephalomalacia to suggest chronic cortical infarction or other insult. No foci of susceptibility artifact indicative of acute or chronic intracranial blood products. No mass lesion, midline shift or mass effect. Ventricles normal in size and morphology without hydrocephalus. No extra-axial fluid collection. Pituitary gland and suprasellar region within normal limits. Vascular: Major intracranial vascular flow voids are well maintained. Skull and upper cervical spine: Craniocervical junction within normal limits. Decreased T1 signal intensity noted within the visualized bone marrow, nonspecific, but most commonly related to anemia, smoking, or obesity. Sinuses/Orbits: Prior bilateral ocular lens replacement. Mild scattered mucosal thickening noted about the ethmoidal air cells. Paranasal sinuses are otherwise largely clear. No significant mastoid effusion. Other: None. IMPRESSION: 1. No acute intracranial abnormality. 2. Mild cerebral white matter disease, nonspecific, but most commonly related to chronic microvascular ischemic disease. Changes are new and/or progressed as compared to prior brain MRI from 01/30/2020. Electronically Signed   By: Virgia Griffins M.D.   On: 03/27/2024 18:55   DG Ankle Left Port Result Date: 03/27/2024 CLINICAL DATA:  Left ankle pain. EXAM: PORTABLE LEFT ANKLE - 2 VIEW COMPARISON:  None Available. FINDINGS: No acute fracture or dislocation. The ankle mortise is intact. There is diffuse subcutaneous edema. No radiopaque foreign object or soft tissue gas. IMPRESSION: 1. No acute fracture or dislocation. 2. Diffuse subcutaneous edema. Electronically Signed   By: Angus Bark M.D.   On: 03/27/2024 14:23   EEG adult Result Date: 03/27/2024 Arleene Lack, MD     03/27/2024 12:35 PM Patient Name: JADENE STEMMER MRN: [id number] Epilepsy Attending: Arleene Lack Referring Physician/Provider: Epifanio Haste, MD Date: 03/27/2024 Duration: 25.09 mins Patient history: 48yo F with seizure in the setting of hypoglycemia. EEG to evaluate for seizure. Level of alertness: Awake AEDs during EEG study: PGB Technical aspects: This EEG study was done with scalp electrodes positioned according to the 10-20 International system of electrode placement. Electrical activity was reviewed with band pass filter of 1-70Hz , sensitivity of 7 uV/mm, display speed of 2mm/sec with a 60Hz  notched filter applied as appropriate. EEG data were recorded continuously and digitally stored.  Video monitoring was available and reviewed as appropriate. Description: The posterior dominant rhythm consists of 8 Hz activity of moderate voltage (25-35 uV) seen predominantly in posterior head regions,  symmetric and reactive to eye opening and eye closing. EEG showed intermittent generalized 3 to 6 Hz theta-delta slowing. Physiologic photic driving was not seen during photic stimulation.  Hyperventilation was not performed.   ABNORMALITY - Intermittent slow, generalized IMPRESSION: This study is suggestive of mild diffuse encephalopathy. No seizures or epileptiform discharges were seen throughout the recording. Arleene Lack   DG Shoulder Right Result Date: 03/27/2024 CLINICAL DATA:  Seizure.  Shoulder pain. EXAM: RIGHT SHOULDER - 2+ VIEW COMPARISON:  None Available. FINDINGS: Exam is markedly limited by patient positioning. Within this limitation findings are compatible with a comminuted humeral neck fracture. No evidence for shoulder dislocation or separation. IMPRESSION: Comminuted fracture of the proximal humerus, in the region of the anatomical neck. Electronically Signed   By: Donnal Fusi M.D.    On: 03/27/2024 06:37    ____________________________________________   PROCEDURES  Procedure(s) performed:   Procedures  CRITICAL CARE Performed by: Roberts Ching Total critical care time: 35 minutes Critical care time was exclusive of separately billable procedures and treating other patients. Critical care was necessary to treat or prevent imminent or life-threatening deterioration. Critical care was time spent personally by me on the following activities: development of treatment plan with patient and/or surrogate as well as nursing, discussions with consultants, evaluation of patient's response to treatment, examination of patient, obtaining history from patient or surrogate, ordering and performing treatments and interventions, ordering and review of laboratory studies, ordering and review of radiographic studies, pulse oximetry and re-evaluation of patient's condition.  Abby Hocking, MD Emergency Medicine  ____________________________________________   INITIAL IMPRESSION / ASSESSMENT AND PLAN / ED COURSE  Pertinent labs & imaging results that were available during my care of the patient were reviewed by me and considered in my medical decision making (see chart for details).   This patient is Presenting for Evaluation of AMS/seizure, which does require a range of treatment options, and is a complaint that involves a high risk of morbidity and mortality.  The Differential Diagnoses includes but is not exclusive to alcohol, illicit or prescription medications, intracranial pathology such as stroke, intracerebral hemorrhage, fever or infectious causes including sepsis, hypoxemia, uremia, trauma, endocrine related disorders such as diabetes, hypoglycemia, thyroid -related diseases, etc.   Critical Interventions-    Medications  dextrose  10 % infusion ( Intravenous New Bag/Given 03/27/24 2318)  furosemide  (LASIX ) tablet 40 mg (40 mg Oral Given 03/27/24 1045)  hydrALAZINE   (APRESOLINE ) tablet 25 mg (25 mg Oral Given 03/27/24 2113)  metoprolol  succinate (TOPROL -XL) 24 hr tablet 100 mg (100 mg Oral Given 03/27/24 1045)  nitroGLYCERIN  (NITROSTAT ) SL tablet 0.4 mg (has no administration in time range)  busPIRone  (BUSPAR ) tablet 10 mg (10 mg Oral Given 03/27/24 2113)  pantoprazole  (PROTONIX ) EC tablet 40 mg (40 mg Oral Given 03/27/24 1045)  dicyclomine  (BENTYL ) tablet 20 mg (20 mg Oral Given 03/27/24 2113)  metoCLOPramide  (REGLAN ) tablet 10 mg (has no administration in time range)  sucralfate  (CARAFATE ) tablet 1 g (1 g Oral Given 03/27/24 2113)  melatonin tablet 10 mg (10 mg Oral Given 03/27/24 2113)  naloxone  (NARCAN ) nasal spray 4 mg/0.1 mL (has no administration in time range)  pregabalin  (LYRICA ) capsule 100 mg (100 mg Oral Given 03/27/24 2113)  tiZANidine  (ZANAFLEX ) tablet 6 mg (6 mg Oral Given 03/27/24 1937)  fluticasone  furoate-vilanterol (BREO ELLIPTA ) 200-25 MCG/ACT 1 puff (1 puff Inhalation Given 03/27/24 1045)  enoxaparin  (LOVENOX ) injection 40 mg (40 mg Subcutaneous Given 03/27/24 1052)  hydrALAZINE  (APRESOLINE ) injection 5 mg (has no  administration in time range)  docusate sodium  (COLACE) capsule 100 mg (100 mg Oral Given 03/27/24 2113)  morphine  (PF) 2 MG/ML injection 2 mg (2 mg Intravenous Given 03/27/24 2315)  oxyCODONE  (Oxy IR/ROXICODONE ) immediate release tablet 10 mg (10 mg Oral Given 03/27/24 1715)  nicotine  (NICODERM CQ  - dosed in mg/24 hours) patch 21 mg (21 mg Transdermal Patch Applied 03/27/24 1224)  dextrose  50 % solution 50 mL (50 mLs Intravenous Given 03/27/24 0618)  oxyCODONE  (Oxy IR/ROXICODONE ) immediate release tablet 10 mg (10 mg Oral Given 03/27/24 0724)  potassium chloride  SA (KLOR-CON  M) CR tablet 40 mEq (40 mEq Oral Given 03/27/24 1554)  LORazepam  (ATIVAN ) 2 MG/ML concentrated solution 0.5 mg (0.5 mg Oral Given 03/27/24 1450)    Reassessment after intervention:  CBG up-trending.    I did obtain Additional Historical Information from Mom and EMS,  as the patient is altered.  I decided to review pertinent External Data, and in summary patient with history of IDDM.   Clinical Laboratory Tests Ordered, included CBG 69 on arrival. No AKI. No anemia.   Radiologic Tests Ordered, included shoulder XR. I independently interpreted the images and agree with radiology interpretation.   Cardiac Monitor Tracing which shows NSR.   Social Determinants of Health Risk patient is not a smoker.  Consult complete with [facility]. Plan for admit.   Medical Decision Making: Summary:  Patient presents to the emergency department with seizure activity likely related to hypoglycemia.  No clear trigger for this on initial assessment.  Plan for screening blood work, D50 here, and follow x-ray of the right shoulder with some pain on attempted range of motion.  Reevaluation with update and discussion with patient and Mom. Right shoulder with a proximal humerus fx. Will apply sling. Will need outpatient ortho follow up after discharge. CBG up-trending. Will admit for continued CBG monitoring given seizure activity.    Patient's presentation is most consistent with acute presentation with potential threat to life or bodily function.   Disposition: admit  ____________________________________________  FINAL CLINICAL IMPRESSION(S) / ED DIAGNOSES  Final diagnoses:  Hypoglycemia  Seizure-like activity (HCC)  Closed fracture of proximal end of right humerus, unspecified fracture morphology, initial encounter    Note:  This document was prepared using Dragon voice recognition software and may include unintentional dictation errors.  Abby Hocking, MD, [facility] Emergency Medicine    Mariselda Badalamenti, Shereen Dike, MD 03/28/24 [phone]

## 2024-03-27 NOTE — Plan of Care (Signed)
   Problem: Nutrition: Goal: Adequate nutrition will be maintained Outcome: Progressing   Problem: Coping: Goal: Level of anxiety will decrease Outcome: Progressing   Problem: Safety: Goal: Ability to remain free from injury will improve Outcome: Progressing

## 2024-03-27 NOTE — Evaluation (Signed)
 Physical Therapy Evaluation Patient Details Name: Jill Shaw MRN: [id number] DOB: [birth year] Today's Date: 03/27/2024  History of Present Illness  Pt is a 48 y.o. female who presented 03/27/24 with a seizure in the setting of hypoglycemia. Pt also found to have R humerus fx, plan for non-op management with sling and NWB. PMH - HTN, DM2, COPD, CHF, CKD, fibromyalgia, GERD, HLD, sickle cell trait, anxiety, seizures   Clinical Impression  Pt presents with condition above and deficits mentioned below, see PT Problem List. PTA, she was mod I utilizing a SPC vs RW for functional mobility, living with her mother in a ground floor 1-level apartment with a level entrance. Her mother can provide 24/7 care at d/c. Currently, the pt is limited by R UE, R neck, and R anterolateral ankle pain, but she is willing to participate to try to improve and eventually d/c home. Notified RN and MD of R neck and R anterolateral ankle pain along with mother's report that the pt was found seizing in her bed and her mother denies any known falls. The pt was only able to take a few steps along the EOB with L UE support and minA today, displaying an antalgic gait pattern with decreased R weight shift and stance time. She required up to [facility] for bed mobility and minA for transfers. Considering she has good support at d/c and is motivated to participate and improve despite the pain, she could greatly benefit from intensive inpatient rehab, > 3 hours/day. However, as her pain improves she may progress quickly and progress to being able to d/c home with HHPT vs OPPT if still needed then. Will continue to follow acutely and assess d/c needs.       If plan is discharge home, recommend the following: A lot of help with walking and/or transfers;A lot of help with bathing/dressing/bathroom;Assistance with cooking/housework;Assist for transportation   Can travel by private vehicle        Equipment Recommendations Other (comment)  (TBA further)  Recommendations for Other Services  Rehab consult    Functional Status Assessment Patient has had a recent decline in their functional status and demonstrates the ability to make significant improvements in function in a reasonable and predictable amount of time.     Precautions / Restrictions Precautions Precautions: Fall Required Braces or Orthoses: Sling Restrictions Weight Bearing Restrictions Per Provider Order: Yes RUE Weight Bearing Per Provider Order: Non weight bearing      Mobility  Bed Mobility Overal bed mobility: Needs Assistance Bed Mobility: Supine to Sit, Sit to Supine     Supine to sit: Mod assist, HOB elevated Sit to supine: Min assist, HOB elevated   General bed mobility comments: Cues provided to bring legs off L EOB and pull up on therapist with her L UE to ascend her trunk to sit up, modA needed at trunk. Extra time and cues needed to pivot hips and lift legs back up onto bed with return to supine. HOB elevated to meet pt's trunk with return to supine.    Transfers Overall transfer level: Needs assistance Equipment used: 1 person hand held assist Transfers: Sit to/from Stand Sit to Stand: Min assist           General transfer comment: MinA with L HHA needed to power up to stand and gain balance when transferring from EOB    Ambulation/Gait Ambulation/Gait assistance: Min assist Gait Distance (Feet): 3 Feet Assistive device: 1 person hand held assist, None Gait Pattern/deviations: Step-to pattern,  Decreased stance time - right, Decreased weight shift to right, Decreased stride length, Antalgic, Trunk flexed Gait velocity: reduced Gait velocity interpretation: <1.31 ft/sec, indicative of household ambulator   General Gait Details: Pt held onto PT or onto bed rail with L UE to support herself and unweight herself during R stance time. She displays a slow, antalgic gait pattern when stepping along EOB. Pt declined to ambulate further  due to pain and fatigue. MinA for balance  Stairs            Wheelchair Mobility     Tilt Bed    Modified Rankin (Stroke Patients Only) Modified Rankin (Stroke Patients Only) Pre-Morbid Rankin Score: Slight disability Modified Rankin: Moderately severe disability     Balance Overall balance assessment: Needs assistance Sitting-balance support: No upper extremity supported, Feet supported Sitting balance-Leahy Scale: Fair Sitting balance - Comments: static sitting EOB with supervision for safety   Standing balance support: Single extremity supported, During functional activity Standing balance-Leahy Scale: Poor Standing balance comment: reliant on L UE support with attempts to ambulate                             Pertinent Vitals/Pain Pain Assessment Pain Assessment: Faces Faces Pain Scale: Hurts even more Pain Location: R UE, R anterolateral ankle, R neck Pain Descriptors / Indicators: Discomfort, Grimacing, Guarding, Operative site guarding, Moaning Pain Intervention(s): Limited activity within patient's tolerance, Monitored during session, Repositioned, Premedicated before session    Home Living Family/patient expects to be discharged to:: Private residence Living Arrangements: Parent (mother) Available Help at Discharge: Family;Available 24 hours/day Type of Home: Apartment Home Access: Level entry       Home Layout: One level Home Equipment: Agricultural consultant (2 wheels);Cane - single point      Prior Function Prior Level of Function : Independent/Modified Independent             Mobility Comments: Uses RW vs SPC depending on the day       Extremity/Trunk Assessment   Upper Extremity Assessment Upper Extremity Assessment: Defer to OT evaluation    Lower Extremity Assessment Lower Extremity Assessment: RLE deficits/detail;LLE deficits/detail;Generalized weakness RLE Deficits / Details: MMT score of 4/5 in quads; denied  numbness/tingling bil; reports anterolateral ankle pain with weight bearing; WFL ankle ROM LLE Deficits / Details: MMT score of 4/5 in quads; denied numbness/tingling bil    Cervical / Trunk Assessment Cervical / Trunk Assessment: Other exceptions Cervical / Trunk Exceptions: increased body habitus  Communication   Communication Communication: No apparent difficulties    Cognition Arousal: Alert Behavior During Therapy: Flat affect   PT - Cognitive impairments: No apparent impairments                       PT - Cognition Comments: Pt slow to respond at times, but may have been due to being distracted by pain Following commands: Intact       Cueing Cueing Techniques: Verbal cues     General Comments General comments (skin integrity, edema, etc.): mother present and reports she found pt in bed when pt was seizing and denies pt falling as far as she is aware    Exercises     Assessment/Plan    PT Assessment Patient needs continued PT services  PT Problem List Decreased strength;Decreased activity tolerance;Decreased range of motion;Decreased balance;Decreased mobility;Pain;Obesity       PT Treatment Interventions DME instruction;Gait training;Functional mobility  training;Therapeutic activities;Therapeutic exercise;Balance training;Neuromuscular re-education;Patient/family education    PT Goals (Current goals can be found in the Care Plan section)  Acute Rehab PT Goals Patient Stated Goal: to improve PT Goal Formulation: With patient/family Time For Goal Achievement: 04/10/24 Potential to Achieve Goals: Good    Frequency Min 3X/week     Co-evaluation               AM-PAC PT 6 Clicks Mobility  Outcome Measure Help needed turning from your back to your side while in a flat bed without using bedrails?: A Lot Help needed moving from lying on your back to sitting on the side of a flat bed without using bedrails?: A Lot Help needed moving to and from a  bed to a chair (including a wheelchair)?: A Little Help needed standing up from a chair using your arms (e.g., wheelchair or bedside chair)?: A Little Help needed to walk in hospital room?: Total Help needed climbing 3-5 steps with a railing? : Total 6 Click Score: 12    End of Session   Activity Tolerance: Patient limited by pain Patient left: in bed;with call bell/phone within reach;with bed alarm set;with family/visitor present Nurse Communication: Mobility status;Other (comment) (notified RN and MD of neck and R ankle pain with mother reporting pt did not fall) PT Visit Diagnosis: Other abnormalities of gait and mobility (R26.89);Unsteadiness on feet (R26.81);Muscle weakness (generalized) (M62.81);Difficulty in walking, not elsewhere classified (R26.2);Pain Pain - Right/Left: Right Pain - part of body: Ankle and joints of foot;Arm (neck)    Time: 5784-6962 PT Time Calculation (min) (ACUTE ONLY): 31 min   Charges:   PT Evaluation $PT Eval Moderate Complexity: 1 Mod PT Treatments $Therapeutic Activity: 8-22 mins PT General Charges $$ ACUTE PT VISIT: 1 Visit         Vernida Goodie, PT, DPT Acute Rehabilitation Services  Office: [phone]   Ellyn Hack 03/27/2024, 1:46 PM

## 2024-03-27 NOTE — Progress Notes (Signed)
Pt return to unit from MRI.

## 2024-03-27 NOTE — ED Notes (Signed)
 Admitting MD at [facility].

## 2024-03-27 NOTE — Progress Notes (Signed)
 Pt off unit for Brain MRI with SWOT RN

## 2024-03-28 ENCOUNTER — Other Ambulatory Visit: Payer: Self-pay

## 2024-03-28 ENCOUNTER — Other Ambulatory Visit (HOSPITAL_COMMUNITY): Payer: Self-pay

## 2024-03-28 ENCOUNTER — Inpatient Hospital Stay (HOSPITAL_COMMUNITY)

## 2024-03-28 ENCOUNTER — Telehealth (HOSPITAL_COMMUNITY): Payer: Self-pay | Admitting: Pharmacy Technician

## 2024-03-28 DIAGNOSIS — R569 Unspecified convulsions: Secondary | ICD-10-CM | POA: Diagnosis not present

## 2024-03-28 DIAGNOSIS — E162 Hypoglycemia, unspecified: Secondary | ICD-10-CM | POA: Diagnosis not present

## 2024-03-28 LAB — BASIC METABOLIC PANEL WITH GFR
Anion gap: 8 (ref 5–15)
BUN: 6 mg/dL (ref 6–20)
CO2: 21 mmol/L — ABNORMAL LOW (ref 22–32)
Calcium: 8.8 mg/dL — ABNORMAL LOW (ref 8.9–10.3)
Chloride: 111 mmol/L (ref 98–111)
Creatinine, Ser: 1.08 mg/dL — ABNORMAL HIGH (ref 0.44–1.00)
GFR, Estimated: >60 mL/min (ref >60)
Glucose, Bld: 161 mg/dL — ABNORMAL HIGH (ref 70–99)
Potassium: 3.5 mmol/L (ref 3.5–5.1)
Sodium: 140 mmol/L (ref 135–145)

## 2024-03-28 LAB — CBC
HCT: 35.4 % — ABNORMAL LOW (ref 36.0–46.0)
Hemoglobin: 11.6 g/dL — ABNORMAL LOW (ref 12.0–15.0)
MCH: 27.8 pg (ref 26.0–34.0)
MCHC: 32.8 g/dL (ref 30.0–36.0)
MCV: 84.9 fL (ref 80.0–100.0)
Platelets: 151 10*3/uL (ref 150–400)
RBC: 4.17 MIL/uL (ref 3.87–5.11)
RDW: 14.6 % (ref 11.5–15.5)
WBC: 7.6 10*3/uL (ref 4.0–10.5)
nRBC: 0 % (ref 0.0–0.2)

## 2024-03-28 LAB — GLUCOSE, CAPILLARY
Glucose-Capillary: 160 mg/dL — ABNORMAL HIGH (ref 70–99)
Glucose-Capillary: 161 mg/dL — ABNORMAL HIGH (ref 70–99)
Glucose-Capillary: 182 mg/dL — ABNORMAL HIGH (ref 70–99)
Glucose-Capillary: 183 mg/dL — ABNORMAL HIGH (ref 70–99)
Glucose-Capillary: 215 mg/dL — ABNORMAL HIGH (ref 70–99)
Glucose-Capillary: 216 mg/dL — ABNORMAL HIGH (ref 70–99)
Glucose-Capillary: 229 mg/dL — ABNORMAL HIGH (ref 70–99)
Glucose-Capillary: 329 mg/dL — ABNORMAL HIGH (ref 70–99)
Glucose-Capillary: 351 mg/dL — ABNORMAL HIGH (ref 70–99)
Glucose-Capillary: 364 mg/dL — ABNORMAL HIGH (ref 70–99)

## 2024-03-28 MED ORDER — HYDROMORPHONE HCL 1 MG/ML IJ SOLN
0.5000 mg | INTRAMUSCULAR | Status: DC | PRN
  Administered 2024-03-28 – 2024-03-30 (×11): 0.5 mg via INTRAVENOUS
  Filled 2024-03-28 (×11): qty 0.5

## 2024-03-28 MED ORDER — INSULIN ASPART 100 UNIT/ML IJ SOLN
5.0000 [IU] | Freq: Three times a day (TID) | INTRAMUSCULAR | Status: DC
  Administered 2024-03-28: 5 [IU] via SUBCUTANEOUS

## 2024-03-28 MED ORDER — INSULIN GLARGINE-YFGN 100 UNIT/ML ~~LOC~~ SOLN
30.0000 [IU] | Freq: Every day | SUBCUTANEOUS | Status: DC
  Administered 2024-03-28: 30 [IU] via SUBCUTANEOUS
  Filled 2024-03-28 (×2): qty 0.3

## 2024-03-28 NOTE — Telephone Encounter (Signed)
 Patient Product/process development scientist completed.    The patient is insured through [facility]. Patient has Medicare and is not eligible for a copay card, but may be able to apply for patient assistance or Medicare RX Payment Plan (Patient Must reach out to their plan, if eligible for payment plan), if available.    Ran test claim for Dexcom G7 Sensor and the current 30 day co-pay is $75.21.  Ran test claim for Freestyle Libre 3 Plus Sensor and the current 30 day co-pay is $29.69.  This test claim was processed through [facility]- copay amounts may vary at other pharmacies due to pharmacy/plan contracts, or as the patient moves through the different stages of their insurance plan.     Morgan Arab, CPHT Pharmacy Technician III Certified Patient Advocate [facility] Patient Advocate Team Direct Number: [phone]  Fax: [phone]

## 2024-03-28 NOTE — Inpatient Diabetes Management (Signed)
 Inpatient Diabetes Program Recommendations  AACE/ADA: New Consensus Statement on Inpatient Glycemic Control (2015)  Target Ranges:  Prepandial:   less than 140 mg/dL      Peak postprandial:   less than 180 mg/dL (1-2 hours)      Critically ill patients:  140 - 180 mg/dL   Lab Results  Component Value Date   GLUCAP 329 (H) 03/28/2024   HGBA1C 9.2 (H) 03/27/2024    Review of Glycemic Control  Latest Reference Range & Units 03/28/24 06:08 03/28/24 08:27 03/28/24 10:05 03/28/24 11:55 03/28/24 13:57  Glucose-Capillary 70 - 99 mg/dL 098 (H) 119 (H) 147 (H) 229 (H) 329 (H)  (H): Data is abnormally high  Diabetes history: DM2 Outpatient Diabetes medications: Toujeo  60 units every day, Humalog  60 units TID with meals, Jardiance  10 mg QD Current orders for Inpatient glycemic control: CBGs  Inpatient Diabetes Program Recommendations:    Please consider:  Semglee  30 units every day Novolog  0-15 units TID and 0-5 units at bedtime Novolog  6 units TID with meals if she consumes at least 50%  Met with patient at bedside.  She is currently out of Humalog  at home.  She is unsure why her blood sugar went low because she gave herself less Humalog  the day prior to coming to the ED.  She says her glucose is usually 200-300 at night.  She checks her glucose using her glucometer.  She has never used a CGM before but is interested.  She has Medicaid and Medicare; TOC benefit checks says the Freestyle Libre 3 is $29.00 for a months supply.  This should be able to go through [facility] for a lower cost?  She will need the reader for the [facility] 3 as her phone is not set up for app purchases.  Provided order numbers in the Adult Diabetes Insulin  Treatment Post Discharge order set.  She states her son can help her place the cgm when she gets home and set up the reader.  They can also ask the pharmacist for assistance when she picks up her prescription.  Explained alarms to help avoid hypoglycemia.  Reviewed  hypoglycemia, < 70 mg/dL, signs, symptoms and treatments.    Discharge Recommendations: Other recommendations: Freestyle Libre 3 Sensor and reader.  Sensor order # [id number] & Reader order # [id number] Long acting recommendations: Insulin  Glargine (TOUJEO ) 300 Units/mL Solostar Pen 60 units QD  Short acting recommendations:  Meal coverage ONLY Insulin  lispro (HUMALOG ) KwikPen  60 units TID with meals     Use Adult Diabetes Insulin  Treatment Post Discharge order set.  Thank you, Hays Lipschutz, MSN, CDCES Diabetes Coordinator Inpatient Diabetes Program [phone] (team pager from 8a-5p)

## 2024-03-28 NOTE — Evaluation (Signed)
 Occupational Therapy Evaluation Patient Details Name: Jill Shaw MRN: [id number] DOB: 1976-03-05 Today's Date: 03/28/2024   History of Present Illness   Pt is a 48 y.o. female who presented 03/27/24 with a seizure in the setting of hypoglycemia. Pt also found to have R humerus fx, plan for non-op management with sling and NWB. PMH - HTN, DM2, COPD, CHF, CKD, fibromyalgia, GERD, HLD, sickle cell trait, anxiety, seizures     Clinical Impressions Pt admitted with the above diagnosis. Pt currently with functional limitations due to the deficits listed below (see OT Problem List). Prior to admit, pt reports that she was living with her mother and was independent with all ADL tasks. Due to recent Right humerus fx, pt now requires increased physical assistance. Pt reports that her mother is able to assist her at home. Recommend follow up OPOT for R humerus fx.  Pt will benefit from acute skilled OT to increase their safety and independence with ADL and functional mobility for ADL to facilitate discharge.       If plan is discharge home, recommend the following:   A little help with walking and/or transfers;A lot of help with bathing/dressing/bathroom;Assistance with cooking/housework;Assist for transportation;Help with stairs or ramp for entrance     Functional Status Assessment   Patient has had a recent decline in their functional status and demonstrates the ability to make significant improvements in function in a reasonable and predictable amount of time.     Equipment Recommendations   Tub/shower bench;BSC/3in1      Precautions/Restrictions   Precautions Precautions: Fall Recall of Precautions/Restrictions: Intact Precaution/Restrictions Comments: R humerus fx Required Braces or Orthoses: Sling Restrictions Weight Bearing Restrictions Per Provider Order: Yes RUE Weight Bearing Per Provider Order: Non weight bearing     Mobility Bed Mobility    General bed mobility  comments: up in recliner upon therapy arrival    Transfers Overall transfer level: Needs assistance Equipment used: 1 person hand held assist Transfers: Sit to/from Stand Sit to Stand: Min assist       Balance Overall balance assessment: Mild deficits observed, not formally tested       ADL either performed or assessed with clinical judgement   ADL Overall ADL's : Needs assistance/impaired Eating/Feeding: Set up;Sitting   Grooming: Wash/dry face;Oral care;Wash/dry hands;Applying deodorant;Minimal assistance;Sitting   Upper Body Bathing: Moderate assistance;Sitting;Cueing for UE precautions   Lower Body Bathing: Maximal assistance;Sit to/from stand   Upper Body Dressing : Maximal assistance;Sitting;Cueing for UE precautions   Lower Body Dressing: Maximal assistance;Sit to/from stand   Toilet Transfer: Minimal assistance;BSC/3in1 Statistician Details (indicate cue type and reason): HHA d/t sling use Toileting- Clothing Manipulation and Hygiene: Maximal assistance;Sit to/from stand        Vision Baseline Vision/History: 0 No visual deficits Ability to See in Adequate Light: 0 Adequate Patient Visual Report: No change from baseline Vision Assessment?: No apparent visual deficits     Perception Perception: Not tested       Praxis Praxis: Not tested       Pertinent Vitals/Pain Pain Assessment Pain Assessment: 0-10 Pain Score: 9  Pain Location: RUE Pain Descriptors / Indicators: Discomfort, Grimacing, Guarding Pain Intervention(s): Limited activity within patient's tolerance, Monitored during session, Other (comment) (offered ice pack; pt declined)     Extremity/Trunk Assessment Upper Extremity Assessment Upper Extremity Assessment: Right hand dominant;RUE deficits/detail RUE Deficits / Details: Recent humerus fx. Unable to asses elbow ROM d/t to pain. Wrist and hand A/ROM limited d/t to pain although  appears functional. Strength not assessed. RUE: Unable to  fully assess due to immobilization;Unable to fully assess due to pain;Shoulder pain at rest RUE Sensation:  (NT) RUE Coordination: decreased fine motor;decreased gross motor   Lower Extremity Assessment Lower Extremity Assessment: Defer to PT evaluation   Cervical / Trunk Assessment Cervical / Trunk Assessment: Other exceptions Cervical / Trunk Exceptions: body habitus   Communication Communication Communication: No apparent difficulties   Cognition Arousal: Alert Behavior During Therapy: Flat affect Cognition: No apparent impairments       Following commands: Intact       Cueing  General Comments   Cueing Techniques: Verbal cues  Edema noted in RUE d/t to humerus fx. Provided green resistive foam block to squeeze in right hand throughout the door.           Home Living Family/patient expects to be discharged to:: Private residence Living Arrangements: Parent (mother) Available Help at Discharge: Family;Available 24 hours/day Type of Home: Apartment Home Access: Level entry     Home Layout: One level     Bathroom Shower/Tub: Chief Strategy Officer: Standard     Home Equipment: Agricultural consultant (2 wheels);Cane - single point          Prior Functioning/Environment Prior Level of Function : Independent/Modified Independent    Mobility Comments: Uses RW vs SPC depending on the day      OT Problem List: Decreased strength;Pain;Increased edema;Impaired UE functional use;Decreased knowledge of precautions;Impaired balance (sitting and/or standing)   OT Treatment/Interventions: Self-care/ADL training;Therapeutic exercise;Therapeutic activities;Energy conservation;DME and/or AE instruction;Patient/family education;Balance training;Manual therapy;Modalities      OT Goals(Current goals can be found in the care plan section)   Acute Rehab OT Goals Patient Stated Goal: to decrease pain OT Goal Formulation: With patient Time For Goal Achievement:  04/11/24 Potential to Achieve Goals: Good   OT Frequency:  7X/week       AM-PAC OT 6 Clicks Daily Activity     Outcome Measure Help from another person eating meals?: A Little Help from another person taking care of personal grooming?: A Little Help from another person toileting, which includes using toliet, bedpan, or urinal?: Total Help from another person bathing (including washing, rinsing, drying)?: Total Help from another person to put on and taking off regular upper body clothing?: A Lot Help from another person to put on and taking off regular lower body clothing?: Total 6 Click Score: 11   End of Session Equipment Utilized During Treatment: Other (comment) (sling)  Activity Tolerance: Patient tolerated treatment well;Patient limited by pain Patient left: in chair;with call bell/phone within reach  OT Visit Diagnosis: Unsteadiness on feet (R26.81);Muscle weakness (generalized) (M62.81);Pain Pain - Right/Left: Right Pain - part of body: Arm                Time: 6213-0865 OT Time Calculation (min): 19 min Charges:  OT General Charges $OT Visit: 1 Visit OT Evaluation $OT Eval Moderate Complexity: 1 Mod  [employer], OTR/L,CBIS  Supplemental OT - [facility] and [facility] Secure Chat Preferred    Kenishia Plack, Ocie Belt 03/28/2024, 2:10 PM

## 2024-03-28 NOTE — Progress Notes (Signed)
 Progress Note   Patient: Jill Shaw ZOX:[id number] DOB: January 09, 1976 DOA: 03/27/2024     1 DOS: the patient was seen and examined on 03/28/2024   Brief hospital course: No notes on file  Assessment and Plan: Seizures Patient reports history of seizures in the past, only with hypoglycemia.  Previous hospitalist discussed with neurology, currently no indication for antiepileptic drugs. Patient is MRI brain without evidence of acute intracranial abnormality.  Chronic microvascular ischemic disease noted, progressed from 01/2020. -Ensure normal glycemia - Seizure precautions   R humerus fracture  In the setting of seizure. Orthopedics consulted. Plan for non-operative management with sling NWB.  Will need F/u with Dr. Abigail Abler in 2 weeks.   Type 2 diabetes Poorly controlled.  A1c a month ago 8.9. Patient reports she takes 60 units of Toujeo , and 60 units of mealtime insulin  3 times a day. - Will start patient on 30 units of Semglee  while inpatient and 5 units of mealtime insulin  plus sliding scale insulin  - Consult diabetes education, patient may also benefit from continuous glucose monitoring  CKD 3 a     Latest Ref Rng & Units 03/28/2024    6:48 AM 03/27/2024    5:49 AM 02/25/2024    2:44 PM  BMP  Glucose 70 - 99 mg/dL 811  92  914   BUN 6 - 20 mg/dL 6  10  15    Creatinine 0.44 - 1.00 mg/dL 7.82  9.56  2.13   BUN/Creat Ratio 9 - 23   10   Sodium 135 - 145 mmol/L 140  141  141   Potassium 3.5 - 5.1 mmol/L 3.5  3.1  3.8   Chloride 98 - 111 mmol/L 111  107  100   CO2 22 - 32 mmol/L 21  19  24    Calcium  8.9 - 10.3 mg/dL 8.8  9.5  9.5   Appears at baseline Avoid nephrotix agents   Chronic pain syndrome Has chronic back pain from a vertebral compression fracture. Has knee OA. And neuropathy   Mild normocytic anemia  Check iron  panel    Concern for OSA/OHS Being evaluated for cpap vs Bipap outpaitent    Gastroparesis 2/2 diabetes Reglan  as needed   COPD Continue ICS  LABA   Mininal non obs CAD, HLD Continue statin   HTN Diastolic HF, not in exacerbation  Elevated.  On home metop succinate  and lasix    Class 3 obesity   BMI 51 Counsel on weight loss       Subjective: No dysuria, cough. Pain in R shoulder and chronic pain in legs.   Supposed to see kidney doctor.   Takes 60 u of toujeo , 60 u humalog  TID with meals. Takes Toujeo  but has run out of humalog .   Physical Exam: Vitals:   03/27/24 2315 03/28/24 0240 03/28/24 0443 03/28/24 0847  BP: 137/72 (!) 148/69  135/72  Pulse: 78 73  76  Resp: 20 18  (!) 21  Temp: 98.7 F (37.1 C) 99 F (37.2 C)    TempSrc: Oral Oral    SpO2: 98%   96%  Weight:   135.7 kg   Height:       Physical Exam  Constitutional: In no distress.  Cardiovascular: Normal rate, regular rhythm. trace lower extremity edema  Pulmonary: Non labored breathing on room air, no wheezing or rales.  Abdominal: Soft. Normal bowel sounds. Non distended and non tender Musculoskeletal: R arm in sling     Neurological: Alert and oriented  to person, place, and time. Non focal  Skin: Skin is warm and dry.   Data Reviewed:     Latest Ref Rng & Units 03/28/2024    6:48 AM 03/27/2024    5:49 AM 02/25/2024    2:44 PM  BMP  Glucose 70 - 99 mg/dL 161  92  096   BUN 6 - 20 mg/dL 6  10  15    Creatinine 0.44 - 1.00 mg/dL 0.45  4.09  8.11   BUN/Creat Ratio 9 - 23   10   Sodium 135 - 145 mmol/L 140  141  141   Potassium 3.5 - 5.1 mmol/L 3.5  3.1  3.8   Chloride 98 - 111 mmol/L 111  107  100   CO2 22 - 32 mmol/L 21  19  24    Calcium  8.9 - 10.3 mg/dL 8.8  9.5  9.5      Family Communication: Mom in room   Disposition: Status is: Inpatient Remains inpatient appropriate because: seizure in setting of hypoglycemia   Planned Discharge Destination: Pending PT/OT evaluation     Time spent: 35 minutes  Author: Joette Mustard, MD 03/28/2024 9:13 AM  For on call review [email or web address].

## 2024-03-28 NOTE — Inpatient Diabetes Management (Signed)
 Inpatient Diabetes Program Recommendations  AACE/ADA: New Consensus Statement on Inpatient Glycemic Control (2015)  Target Ranges:  Prepandial:   less than 140 mg/dL      Peak postprandial:   less than 180 mg/dL (1-2 hours)      Critically ill patients:  140 - 180 mg/dL   Lab Results  Component Value Date   GLUCAP 216 (H) 03/28/2024   HGBA1C 9.2 (H) 03/27/2024    Review of Glycemic Control  Latest Reference Range & Units 03/28/24 06:08 03/28/24 08:27 03/28/24 10:05  Glucose-Capillary 70 - 99 mg/dL 161 (H) 096 (H) 045 (H)  (H): Data is abnormally high  Diabetes history: DM2 Outpatient Diabetes medications: Toujeo  60 units every day, Humalog  60 units TID, Jardiance  10 mg QD Current orders for Inpatient glycemic control: CBGs  Inpatient Diabetes Program Recommendations:    Please consider:  Novolog  0-9 units TID and 0-5 units at bedtime.  Admitted with hypoglycemia.  Will see patient today.   Thank you, Hays Lipschutz, MSN, CDCES Diabetes Coordinator Inpatient Diabetes Program [phone] (team pager from 8a-5p)

## 2024-03-28 NOTE — Progress Notes (Addendum)
 Inpatient Rehab Admissions Coordinator Note:   Per PT recommendations patient was screened for CIR candidacy by Mickey Alar, PT. At this time, pt appears to be a potential candidate for CIR.  Note OT recommending OP OT.  I will place an order for rehab consult for full assessment, per our protocol.  Please contact me any with questions.Loye Rumble, PT, DPT [phone] 03/28/24 2:25 PM

## 2024-03-28 NOTE — Progress Notes (Signed)
 Physical Therapy Treatment Patient Details Name: Jill Shaw MRN: [id number] DOB: 07-Dec-1975 Today's Date: 03/28/2024   History of Present Illness Pt is a 48 y.o. female who presented 03/27/24 with a seizure in the setting of hypoglycemia. Pt also found to have R humerus fx, plan for non-op management with sling and NWB. PMH - HTN, DM2, COPD, CHF, CKD, fibromyalgia, GERD, HLD, sickle cell trait, anxiety, seizures    PT Comments  The pt is demonstrating great functional progress as she was able to transfer to stand with supervision and ambulate 100 ft with a SPC vs QC with CGA for safety only. She did not have any LOB. Discussed pt utilizing a QC during her bad painful days as needed for increased stability as it provides her the ability to place more weight through her L UE now that she is NWB through her R UE and thereby unable to utilize her RW at this time. Discussed that she can safely manage a SPC on her less painful days though as well. Considering her great progress in regards to pain and functional mobility, updated d/c recs to OPPT. Will continue to follow acutely. She could benefit from a QC upon d/c.   If plan is discharge home, recommend the following: Assistance with cooking/housework;Assist for transportation;A little help with walking and/or transfers;A little help with bathing/dressing/bathroom   Can travel by private Merchant navy officer (quad cane)    Recommendations for Other Services       Precautions / Restrictions Precautions Precautions: Fall Recall of Precautions/Restrictions: Intact Required Braces or Orthoses: Sling Restrictions Weight Bearing Restrictions Per Provider Order: Yes RUE Weight Bearing Per Provider Order: Non weight bearing     Mobility  Bed Mobility               General bed mobility comments: Pt up in recliner at start and end of session    Transfers Overall transfer level: Needs assistance Equipment  used: Straight cane Transfers: Sit to/from Stand Sit to Stand: Supervision           General transfer comment: No LOB, supervision to stand from recliner    Ambulation/Gait Ambulation/Gait assistance: Contact guard assist Gait Distance (Feet): 100 Feet Assistive device: Straight cane, Quad cane Gait Pattern/deviations: Decreased stride length, Antalgic, Trunk flexed, Step-through pattern Gait velocity: reduced Gait velocity interpretation: <1.31 ft/sec, indicative of household ambulator   General Gait Details: Pt is taking steps with a lot more ease this date, but still maintains a slightly antalgic gait pattern. Pt utilized a QC for ~20 ft and a SPC the remaining ~80 ft. No LOB, CGA for safety. Increased speed and ease noted when using SPC at this time.   Stairs             Wheelchair Mobility     Tilt Bed    Modified Rankin (Stroke Patients Only) Modified Rankin (Stroke Patients Only) Pre-Morbid Rankin Score: Slight disability Modified Rankin: Moderately severe disability     Balance Overall balance assessment: Needs assistance Sitting-balance support: No upper extremity supported, Feet supported Sitting balance-Leahy Scale: Good     Standing balance support: Single extremity supported, During functional activity, No upper extremity supported Standing balance-Leahy Scale: Fair Standing balance comment: ABle to stand without UE support but does often depend on x1 UE support on cane for standing mobility  Communication Communication Communication: No apparent difficulties  Cognition Arousal: Alert Behavior During Therapy: WFL for tasks assessed/performed   PT - Cognitive impairments: No apparent impairments                       PT - Cognition Comments: Pt slow to respond at times, unsure if baseline Following commands: Intact      Cueing Cueing Techniques: Verbal cues  Exercises      General Comments  General comments (skin integrity, edema, etc.): notified TOC of updated d/c recs and pt requesting script for BP cuff and scale      Pertinent Vitals/Pain Pain Assessment Pain Assessment: Faces Faces Pain Scale: Hurts little more Pain Location: R UE, back Pain Descriptors / Indicators: Discomfort, Grimacing, Guarding, Operative site guarding, Moaning Pain Intervention(s): Limited activity within patient's tolerance, Monitored during session, Repositioned    Home Living Family/patient expects to be discharged to:: Private residence Living Arrangements: Parent (mother) Available Help at Discharge: Family;Available 24 hours/day Type of Home: Apartment Home Access: Level entry       Home Layout: One level Home Equipment: Agricultural consultant (2 wheels);Cane - single point      Prior Function            PT Goals (current goals can now be found in the care plan section) Acute Rehab PT Goals Patient Stated Goal: to improve PT Goal Formulation: With patient/family Time For Goal Achievement: 04/10/24 Potential to Achieve Goals: Good Progress towards PT goals: Progressing toward goals    Frequency    Min 1X/week      PT Plan      Co-evaluation              AM-PAC PT 6 Clicks Mobility   Outcome Measure  Help needed turning from your back to your side while in a flat bed without using bedrails?: A Little Help needed moving from lying on your back to sitting on the side of a flat bed without using bedrails?: A Little Help needed moving to and from a bed to a chair (including a wheelchair)?: A Little Help needed standing up from a chair using your arms (e.g., wheelchair or bedside chair)?: A Little Help needed to walk in hospital room?: A Little Help needed climbing 3-5 steps with a railing? : A Lot 6 Click Score: 17    End of Session   Activity Tolerance: Patient tolerated treatment well Patient left: with call bell/phone within reach;in chair;with chair alarm set    PT Visit Diagnosis: Other abnormalities of gait and mobility (R26.89);Unsteadiness on feet (R26.81);Muscle weakness (generalized) (M62.81);Difficulty in walking, not elsewhere classified (R26.2);Pain Pain - Right/Left: Right Pain - part of body: Arm     Time: 7322-0254 PT Time Calculation (min) (ACUTE ONLY): 18 min  Charges:    $Gait Training: 8-22 mins PT General Charges $$ ACUTE PT VISIT: 1 Visit                     Vernida Goodie, PT, DPT Acute Rehabilitation Services  Office: [phone]    Ellyn Hack 03/28/2024, 3:31 PM

## 2024-03-28 NOTE — Progress Notes (Signed)
 Inpatient Rehab Admissions Coordinator:  Consult received. Note PT/OT recommending outpatient therapy. TOC made aware.   Artemus Larsen, MS, CCC-SLP Admissions Coordinator [phone]

## 2024-03-28 NOTE — Plan of Care (Signed)

## 2024-03-29 ENCOUNTER — Other Ambulatory Visit (HOSPITAL_COMMUNITY): Payer: Self-pay

## 2024-03-29 DIAGNOSIS — E162 Hypoglycemia, unspecified: Secondary | ICD-10-CM | POA: Diagnosis not present

## 2024-03-29 DIAGNOSIS — R569 Unspecified convulsions: Secondary | ICD-10-CM | POA: Diagnosis not present

## 2024-03-29 LAB — BASIC METABOLIC PANEL WITH GFR
Anion gap: 9 (ref 5–15)
BUN: 11 mg/dL (ref 6–20)
CO2: 23 mmol/L (ref 22–32)
Calcium: 9.1 mg/dL (ref 8.9–10.3)
Chloride: 109 mmol/L (ref 98–111)
Creatinine, Ser: 1.16 mg/dL — ABNORMAL HIGH (ref 0.44–1.00)
GFR, Estimated: 59 mL/min — ABNORMAL LOW (ref >60)
Glucose, Bld: 359 mg/dL — ABNORMAL HIGH (ref 70–99)
Potassium: 4.1 mmol/L (ref 3.5–5.1)
Sodium: 141 mmol/L (ref 135–145)

## 2024-03-29 LAB — FERRITIN: Ferritin: 30 ng/mL (ref 11–307)

## 2024-03-29 LAB — GLUCOSE, CAPILLARY
Glucose-Capillary: 291 mg/dL — ABNORMAL HIGH (ref 70–99)
Glucose-Capillary: 291 mg/dL — ABNORMAL HIGH (ref 70–99)
Glucose-Capillary: 289 mg/dL — ABNORMAL HIGH (ref 70–99)
Glucose-Capillary: 281 mg/dL — ABNORMAL HIGH (ref 70–99)

## 2024-03-29 LAB — CBC
HCT: 35.8 % — ABNORMAL LOW (ref 36.0–46.0)
Hemoglobin: 11.9 g/dL — ABNORMAL LOW (ref 12.0–15.0)
MCH: 28.4 pg (ref 26.0–34.0)
MCHC: 33.2 g/dL (ref 30.0–36.0)
MCV: 85.4 fL (ref 80.0–100.0)
Platelets: 144 10*3/uL — ABNORMAL LOW (ref 150–400)
RBC: 4.19 MIL/uL (ref 3.87–5.11)
RDW: 14.6 % (ref 11.5–15.5)
WBC: 6.9 10*3/uL (ref 4.0–10.5)
nRBC: 0 % (ref 0.0–0.2)

## 2024-03-29 LAB — IRON AND TIBC
Iron: 41 ug/dL (ref 28–170)
Saturation Ratios: 13 % (ref 10.4–31.8)
TIBC: 318 ug/dL (ref 250–450)
UIBC: 277 ug/dL

## 2024-03-29 LAB — MAGNESIUM: Magnesium: 1.6 mg/dL — ABNORMAL LOW (ref 1.7–2.4)

## 2024-03-29 LAB — PHOSPHORUS: Phosphorus: 3.6 mg/dL (ref 2.5–4.6)

## 2024-03-29 LAB — FOLATE: Folate: 10.5 ng/mL (ref >5.9)

## 2024-03-29 MED ORDER — DICLOFENAC SODIUM 1 % EX GEL
2.0000 g | CUTANEOUS | Status: DC
  Filled 2024-03-29: qty 100

## 2024-03-29 MED ORDER — INSULIN GLARGINE 100 UNIT/ML SOLOSTAR PEN
45.0000 [IU] | PEN_INJECTOR | Freq: Every day | SUBCUTANEOUS | 2 refills | Status: DC
  Filled 2024-03-29: qty 15, 33d supply, fill #0

## 2024-03-29 MED ORDER — MAGNESIUM SULFATE 2 GM/50ML IV SOLN
2.0000 g | Freq: Once | INTRAVENOUS | Status: AC
  Administered 2024-03-29: 2 g via INTRAVENOUS
  Filled 2024-03-29: qty 50

## 2024-03-29 MED ORDER — RAMELTEON 8 MG PO TABS
8.0000 mg | ORAL_TABLET | Freq: Every day | ORAL | Status: DC
  Administered 2024-03-29: 8 mg via ORAL
  Filled 2024-03-29 (×2): qty 1

## 2024-03-29 MED ORDER — OXYCODONE HCL 5 MG PO TABS
15.0000 mg | ORAL_TABLET | ORAL | Status: DC | PRN
  Administered 2024-03-29 – 2024-03-30 (×4): 15 mg via ORAL
  Filled 2024-03-29 (×4): qty 3

## 2024-03-29 MED ORDER — INSULIN ASPART 100 UNIT/ML IJ SOLN
8.0000 [IU] | Freq: Three times a day (TID) | INTRAMUSCULAR | Status: AC
  Administered 2024-03-29: 8 [IU] via SUBCUTANEOUS

## 2024-03-29 MED ORDER — EMPAGLIFLOZIN 10 MG PO TABS
10.0000 mg | ORAL_TABLET | Freq: Every day | ORAL | Status: DC
  Administered 2024-03-30: 10 mg via ORAL
  Filled 2024-03-29: qty 1

## 2024-03-29 MED ORDER — INSULIN GLARGINE-YFGN 100 UNIT/ML ~~LOC~~ SOLN
45.0000 [IU] | Freq: Every day | SUBCUTANEOUS | Status: DC
  Administered 2024-03-29: 45 [IU] via SUBCUTANEOUS
  Filled 2024-03-29 (×2): qty 0.45

## 2024-03-29 MED ORDER — IBUPROFEN 600 MG PO TABS: 600.0000 mg | ORAL_TABLET | Freq: Three times a day (TID) | ORAL | Status: AC | PRN

## 2024-03-29 MED ORDER — INSULIN ASPART 100 UNIT/ML IJ SOLN
0.0000 [IU] | Freq: Three times a day (TID) | INTRAMUSCULAR | Status: DC
  Administered 2024-03-29 – 2024-03-30 (×3): 8 [IU] via SUBCUTANEOUS
  Administered 2024-03-30: 3 [IU] via SUBCUTANEOUS

## 2024-03-29 MED ORDER — OXYCODONE HCL 5 MG PO TABS: 5.0000 mg | ORAL_TABLET | ORAL | Status: DC | PRN

## 2024-03-29 MED ORDER — FREESTYLE LIBRE 3 SENSOR MISC
5 refills | Status: DC
  Filled 2024-03-29: qty 1, 15d supply, fill #0
  Filled 2024-03-29: qty 1, 14d supply, fill #0

## 2024-03-29 MED ORDER — POLYETHYLENE GLYCOL 3350 17 G PO PACK
17.0000 g | PACK | Freq: Every day | ORAL | Status: DC
  Administered 2024-03-29 – 2024-03-30 (×2): 17 g via ORAL
  Filled 2024-03-29 (×2): qty 1

## 2024-03-29 MED ORDER — SENNA 8.6 MG PO TABS
1.0000 | ORAL_TABLET | Freq: Every day | ORAL | Status: DC
  Administered 2024-03-29 – 2024-03-30 (×2): 8.6 mg via ORAL
  Filled 2024-03-29 (×2): qty 1

## 2024-03-29 MED ORDER — OXYCODONE HCL 5 MG PO TABS
5.0000 mg | ORAL_TABLET | Freq: Four times a day (QID) | ORAL | 0 refills | Status: AC | PRN
  Filled 2024-03-29: qty 20, 5d supply, fill #0

## 2024-03-29 MED ORDER — INSULIN ASPART 100 UNIT/ML IJ SOLN
0.0000 [IU] | Freq: Every day | INTRAMUSCULAR | Status: DC
  Administered 2024-03-29: 3 [IU] via SUBCUTANEOUS

## 2024-03-29 MED ORDER — HUMALOG KWIKPEN 100 UNIT/ML ~~LOC~~ SOPN
15.0000 [IU] | PEN_INJECTOR | Freq: Three times a day (TID) | SUBCUTANEOUS | 3 refills | Status: DC
  Filled 2024-03-29: qty 15, 34d supply, fill #0

## 2024-03-29 MED ORDER — INSULIN ASPART 100 UNIT/ML IJ SOLN
15.0000 [IU] | Freq: Three times a day (TID) | INTRAMUSCULAR | Status: DC
  Administered 2024-03-29 – 2024-03-30 (×4): 15 [IU] via SUBCUTANEOUS

## 2024-03-29 MED ORDER — FERROUS SULFATE 325 (65 FE) MG PO TABS
325.0000 mg | ORAL_TABLET | Freq: Every day | ORAL | Status: DC
  Administered 2024-03-30: 325 mg via ORAL
  Filled 2024-03-29: qty 1

## 2024-03-29 MED ORDER — OXYCODONE HCL 5 MG PO TABS
10.0000 mg | ORAL_TABLET | ORAL | Status: DC | PRN
  Administered 2024-03-29: 10 mg via ORAL
  Filled 2024-03-29: qty 2

## 2024-03-29 MED ORDER — FERROUS SULFATE 325 (65 FE) MG PO TABS
325.0000 mg | ORAL_TABLET | Freq: Every day | ORAL | 0 refills | Status: AC
  Filled 2024-03-29: qty 30, 30d supply, fill #0

## 2024-03-29 MED ORDER — OXYCODONE HCL 5 MG PO TABS: 10.0000 mg | ORAL_TABLET | ORAL | Status: DC | PRN

## 2024-03-29 MED ORDER — SODIUM CHLORIDE 0.9 % IV SOLN
300.0000 mg | Freq: Once | INTRAVENOUS | Status: AC
  Administered 2024-03-29: 300 mg via INTRAVENOUS
  Filled 2024-03-29: qty 15

## 2024-03-29 MED ORDER — SPIRONOLACTONE 25 MG PO TABS: 25.0000 mg | ORAL_TABLET | Freq: Every day | ORAL | Status: DC

## 2024-03-29 MED ORDER — INSULIN ASPART 100 UNIT/ML IJ SOLN: 15.0000 [IU] | Freq: Three times a day (TID) | INTRAMUSCULAR | Status: DC

## 2024-03-29 MED ORDER — SENNA 8.6 MG PO TABS
1.0000 | ORAL_TABLET | Freq: Every day | ORAL | 0 refills | Status: AC
  Filled 2024-03-29: qty 120, 120d supply, fill #0

## 2024-03-29 MED ORDER — LOSARTAN POTASSIUM 50 MG PO TABS
25.0000 mg | ORAL_TABLET | Freq: Every day | ORAL | Status: DC
  Administered 2024-03-30: 25 mg via ORAL
  Filled 2024-03-29: qty 1

## 2024-03-29 MED ORDER — INSULIN PEN NEEDLE 32G X 4 MM MISC
50.0000 | Freq: Four times a day (QID) | 1 refills | Status: DC
Start: 2024-03-29 — End: 2024-06-12
  Filled 2024-03-29: qty 100, 25d supply, fill #0

## 2024-03-29 NOTE — Progress Notes (Signed)
 Progress Note   Patient: Jill Shaw MVH:[id number] DOB: Oct 22, 1975 DOA: 03/27/2024     2 DOS: the patient was seen and examined on 03/29/2024   Brief hospital course: Per H&P HPI   Jill Shaw  is a 48 y.o. female, with PMH DM, gastroparesis, chronic pain on daily opiates, HTN, COPD, dCHF, CKD IIIa, OHS, with recent admission last April secondary to acute on chronic diastolic CHF . - Patient admitted secondary to seizures and hypoglycemia, obtained from mother by ED physician, mother reported she had hears a strange noise from patient's room, patient lives with her mother at home, the patient, and found her to be having seizures, she reports history of seizures in the past secondary to hypoglycemia, she was not seen by neurologist for that or had any AED, EMS were called, patient was blood sugar was found to be 34, they administered 25 g of D10 with some improvement of her CBG and alertness, seizure has stopped, patient reports she takes her insulin  as prescribed, no change in appetite habits, she has been complaining of pain in the right shoulder while in ED. - ED workup significant for low potassium at 3.1, creatinine better than baseline at 1.15, blood cell count mildly elevated at 11.2, right shoulder x-ray significant for right humerus fracture, BG has improved after multiple D50 pushes, but eventually she required D10 W at 100 cc/h stabilize her blood sugar, [facility]  hospitalist consulted to admit.  Assessment and Plan: Seizures Patient reports history of seizures in the past, only with hypoglycemia.  Previous hospitalist discussed with neurology, currently no indication for antiepileptic drugs. MRI brain without evidence of acute intracranial abnormality.  Chronic microvascular ischemic disease noted, progressed from 01/2020. -Ensure normal glycemia - Seizure precautions   R humerus fracture  In the setting of seizure. Orthopedics consulted. Plan for non-operative management with  sling NWB.  Will need F/u with Dr. Abigail Abler in 2 weeks.  Pain control   Type 2 diabetes Poorly controlled.  A1c a month ago 8.9. Patient reports she takes 60 units of Toujeo , and 60 units of mealtime insulin  3 times a day. Jardiance  currently only oral anti-diabetes medicine - Reportedly with intolerance to liraglutide, reportedly some gastroparesis as well which is possibly limiting continued use of this  - Transition patient from toujeo  to lantus  and humalog , she is on 15u TID with meals and 45u at night, will monitor to see if patient develops hypoglycemia   CKD 3 a    Latest Ref Rng & Units 03/29/2024    5:27 AM 03/28/2024    6:48 AM 03/27/2024    5:49 AM  BMP  Glucose 70 - 99 mg/dL 841  324  92   BUN 6 - 20 mg/dL 11  6  10    Creatinine 0.44 - 1.00 mg/dL 4.01  0.27  2.53   Sodium 135 - 145 mmol/L 141  140  141   Potassium 3.5 - 5.1 mmol/L 4.1  3.5  3.1   Chloride 98 - 111 mmol/L 109  111  107   CO2 22 - 32 mmol/L 23  21  19    Calcium  8.9 - 10.3 mg/dL 9.1  8.8  9.5   Appears at baseline Avoid nephrotix agents   Chronic pain syndrome Has chronic back pain from a vertebral compression fracture. Has knee OA. And neuropathy  On oxycodone  10mg  QID.  Mild normocytic anemia     Latest Ref Rng & Units 03/29/2024    5:27 AM 03/28/2024  6:48 AM 03/27/2024    5:49 AM  CBC  WBC 4.0 - 10.5 K/uL 6.9  7.6  11.2   Hemoglobin 12.0 - 15.0 g/dL 45.4  09.8  11.9   Hematocrit 36.0 - 46.0 % 35.8  35.4  38.3   Platelets 150 - 400 K/uL 144  151  152    Iron /TIBC/Ferritin/ %Sat    Component Value Date/Time   IRON  41 03/29/2024 0527   TIBC 318 03/29/2024 0527   FERRITIN 30 03/29/2024 0527   FERRITIN 13 (L) 09/17/2018 1549   IRONPCTSAT 13 03/29/2024 0527   IRONPCTSAT 12 03/30/2017 1453   Start PO iron  supplementation    Concern for OSA/OHS Being evaluated for cpap vs Bipap outpaitent    Gastroparesis 2/2 diabetes Reglan  as needed   COPD Continue ICS LABA   Mininal non obs CAD,  HLD Continue statin   HTN Diastolic HF, not in exacerbation  On home metop succinate  and lasix   Was holding home spironolactone , Starting low dose losartan  and can likely resume spironolactone  at half dose given she has CKD and t2dm   Class 3 obesity  BMI 51 Counsel on weight loss       Subjective: Some pain in the R arm, otherwise no complaints.   Physical Exam: Vitals:   03/29/24 0758 03/29/24 0848 03/29/24 1133 03/29/24 1633  BP:  (!) 149/57 (!) 160/74 (!) 173/69  Pulse: 68 69 70   Resp: 12 18 20 19   Temp:  98.5 F (36.9 C) 98.5 F (36.9 C)   TempSrc:  Oral Oral   SpO2: 96% 95% 96%   Weight:      Height:        Physical Exam  Constitutional: In no distress.  Cardiovascular: Normal rate, regular rhythm. No lower extremity edema, palpable R radial pulse  Pulmonary: Non labored breathing on room air, no wheezing or rales.   Abdominal: Soft. Normal bowel sounds. Non distended and non tender Musculoskeletal: Normal range of motion.     Neurological: Alert and oriented to person, place, and time. Non focal. Sensate in R hand, able to move fingers  Skin: Skin is warm and dry.   Data Reviewed:     Latest Ref Rng & Units 03/29/2024    5:27 AM 03/28/2024    6:48 AM 03/27/2024    5:49 AM  BMP  Glucose 70 - 99 mg/dL 147  829  92   BUN 6 - 20 mg/dL 11  6  10    Creatinine 0.44 - 1.00 mg/dL 5.62  1.30  8.65   Sodium 135 - 145 mmol/L 141  140  141   Potassium 3.5 - 5.1 mmol/L 4.1  3.5  3.1   Chloride 98 - 111 mmol/L 109  111  107   CO2 22 - 32 mmol/L 23  21  19    Calcium  8.9 - 10.3 mg/dL 9.1  8.8  9.5      Family Communication: Mom in room   Disposition: Status is: Inpatient Remains inpatient appropriate because: seizure in setting of hypoglycemia   Planned Discharge Destination: Pending PT/OT evaluation     Time spent: 35 minutes  Author: Joette Mustard, MD 03/29/2024 7:33 PM  For on call review [email or web address].

## 2024-03-29 NOTE — Progress Notes (Signed)
 Occupational Therapy Treatment Patient Details Name: Jill Shaw MRN: [id number] DOB: 05-20-1976 Today's Date: 03/29/2024   History of present illness Pt is a 48 y.o. female who presented 03/27/24 with a seizure in the setting of hypoglycemia. Pt also found to have R humerus fx, plan for non-op management with sling and NWB. PMH - HTN, DM2, COPD, CHF, CKD, fibromyalgia, GERD, HLD, sickle cell trait, anxiety, seizures   OT comments  Patient seated on EOB upon entry with mother present. Patient was provided level one therapy putty and instructed on grip and pinch strengthening exercises. Patient performed mobility in room to address functional transfers with use of IV pole for support and CGA. Patient able to perform grooming tasks standing at sink with min assist and education for one handed technique. Patient on EOB at end of session. Acute OT to continue to follow to address established goals.       If plan is discharge home, recommend the following:  A little help with walking and/or transfers;A lot of help with bathing/dressing/bathroom;Assistance with cooking/housework;Assist for transportation;Help with stairs or ramp for entrance   Equipment Recommendations  Tub/shower bench;BSC/3in1    Recommendations for Other Services      Precautions / Restrictions Precautions Precautions: Fall Recall of Precautions/Restrictions: Intact Precaution/Restrictions Comments: R humerus fx Required Braces or Orthoses: Sling Restrictions Weight Bearing Restrictions Per Provider Order: Yes RUE Weight Bearing Per Provider Order: Non weight bearing       Mobility Bed Mobility Overal bed mobility: Needs Assistance             General bed mobility comments: seated on EOB upon entry and at end of session    Transfers Overall transfer level: Needs assistance Equipment used: None (IV pole) Transfers: Sit to/from Stand Sit to Stand: Contact guard assist           General transfer  comment: CGA for sit to stands and transfers with use of IV pole     Balance Overall balance assessment: Needs assistance Sitting-balance support: No upper extremity supported, Feet supported Sitting balance-Leahy Scale: Good Sitting balance - Comments: seated on EOB upon entry   Standing balance support: Single extremity supported, During functional activity, No upper extremity supported Standing balance-Leahy Scale: Fair Standing balance comment: can stand without UE support                           ADL either performed or assessed with clinical judgement   ADL Overall ADL's : Needs assistance/impaired     Grooming: Wash/dry hands;Wash/dry face;Oral care;Minimal assistance;Standing Grooming Details (indicate cue type and reason): at sink                 Toilet Transfer: Contact guard Marine scientist Details (indicate cue type and reason): used IV pole for support         Functional mobility during ADLs: Contact guard assist      Extremity/Trunk Assessment              Vision       Perception     Praxis     Communication Communication Communication: No apparent difficulties   Cognition Arousal: Alert Behavior During Therapy: WFL for tasks assessed/performed Cognition: No apparent impairments                               Following commands: Intact  Cueing   Cueing Techniques: Verbal cues  Exercises Exercises: Other exercises Other Exercises Other Exercises: level one therapy putty for RUE hand strengthening exercises    Shoulder Instructions       General Comments      Pertinent Vitals/ Pain       Pain Assessment Pain Assessment: Faces Faces Pain Scale: Hurts little more Pain Location: RUE Pain Descriptors / Indicators: Discomfort, Grimacing, Guarding, Operative site guarding, Moaning Pain Intervention(s): Limited activity within patient's tolerance, Monitored during session,  Repositioned, Patient requesting pain meds-RN notified, Premedicated before session  Home Living                                          Prior Functioning/Environment              Frequency  7X/week        Progress Toward Goals  OT Goals(current goals can now be found in the care plan section)  Progress towards OT goals: Progressing toward goals  Acute Rehab OT Goals Patient Stated Goal: less shoulder pain OT Goal Formulation: With patient Time For Goal Achievement: 04/11/24 Potential to Achieve Goals: Good ADL Goals Pt Will Perform Grooming: with set-up;standing Pt Will Perform Upper Body Bathing: with min assist;sitting Pt Will Perform Lower Body Bathing: with min assist;sit to/from stand;with adaptive equipment Pt Will Perform Upper Body Dressing: with min assist;sitting Pt Will Transfer to Toilet: with supervision;ambulating;regular height toilet Pt Will Perform Toileting - Clothing Manipulation and hygiene: with modified independence;sit to/from stand Pt/caregiver will Perform Home Exercise Program: Increased ROM;Increased strength;Right Upper extremity;With theraputty;Independently;With written HEP provided  Plan      Co-evaluation                 AM-PAC OT 6 Clicks Daily Activity     Outcome Measure   Help from another person eating meals?: A Little Help from another person taking care of personal grooming?: A Little Help from another person toileting, which includes using toliet, bedpan, or urinal?: Total Help from another person bathing (including washing, rinsing, drying)?: Total Help from another person to put on and taking off regular upper body clothing?: A Lot Help from another person to put on and taking off regular lower body clothing?: Total 6 Click Score: 11    End of Session Equipment Utilized During Treatment: Other (comment) (IV pole and sling)  OT Visit Diagnosis: Unsteadiness on feet (R26.81);Muscle weakness  (generalized) (M62.81);Pain Pain - Right/Left: Right Pain - part of body: Arm   Activity Tolerance Patient tolerated treatment well;Patient limited by pain   Patient Left in bed;with call bell/phone within reach;with nursing/sitter in room (EOB)   Nurse Communication Mobility status;Patient requests pain meds        Time: 1113-1145 OT Time Calculation (min): 32 min  Charges: OT General Charges $OT Visit: 1 Visit OT Treatments $Self Care/Home Management : 8-22 mins $Therapeutic Exercise: 8-22 mins  Anitra Barn, OTA Acute Rehabilitation Services  Office [phone]   Jovita Nipper 03/29/2024, 2:35 PM

## 2024-03-29 NOTE — Plan of Care (Signed)

## 2024-03-30 DIAGNOSIS — Z794 Long term (current) use of insulin: Secondary | ICD-10-CM

## 2024-03-30 DIAGNOSIS — E1169 Type 2 diabetes mellitus with other specified complication: Secondary | ICD-10-CM

## 2024-03-30 DIAGNOSIS — E662 Morbid (severe) obesity with alveolar hypoventilation: Secondary | ICD-10-CM | POA: Diagnosis not present

## 2024-03-30 LAB — CBC
HCT: 35.7 % — ABNORMAL LOW (ref 36.0–46.0)
Hemoglobin: 11.8 g/dL — ABNORMAL LOW (ref 12.0–15.0)
MCH: 28 pg (ref 26.0–34.0)
MCHC: 33.1 g/dL (ref 30.0–36.0)
MCV: 84.8 fL (ref 80.0–100.0)
Platelets: 166 10*3/uL (ref 150–400)
RBC: 4.21 MIL/uL (ref 3.87–5.11)
RDW: 14.4 % (ref 11.5–15.5)
WBC: 7.9 10*3/uL (ref 4.0–10.5)
nRBC: 0 % (ref 0.0–0.2)

## 2024-03-30 LAB — GLUCOSE, CAPILLARY
Glucose-Capillary: 321 mg/dL — ABNORMAL HIGH (ref 70–99)
Glucose-Capillary: 252 mg/dL — ABNORMAL HIGH (ref 70–99)
Glucose-Capillary: 158 mg/dL — ABNORMAL HIGH (ref 70–99)

## 2024-03-30 LAB — VITAMIN D 25 HYDROXY (VIT D DEFICIENCY, FRACTURES): Vit D, 25-Hydroxy: 6.72 ng/mL — ABNORMAL LOW (ref 30–100)

## 2024-03-30 LAB — MAGNESIUM: Magnesium: 1.6 mg/dL — ABNORMAL LOW (ref 1.7–2.4)

## 2024-03-30 LAB — BASIC METABOLIC PANEL WITH GFR
Anion gap: 6 (ref 5–15)
BUN: 12 mg/dL (ref 6–20)
CO2: 25 mmol/L (ref 22–32)
Calcium: 9 mg/dL (ref 8.9–10.3)
Chloride: 109 mmol/L (ref 98–111)
Creatinine, Ser: 1.12 mg/dL — ABNORMAL HIGH (ref 0.44–1.00)
GFR, Estimated: >60 mL/min (ref >60)
Glucose, Bld: 264 mg/dL — ABNORMAL HIGH (ref 70–99)
Potassium: 4 mmol/L (ref 3.5–5.1)
Sodium: 140 mmol/L (ref 135–145)

## 2024-03-30 MED ORDER — ENOXAPARIN SODIUM 80 MG/0.8ML IJ SOSY: 65.0000 mg | PREFILLED_SYRINGE | INTRAMUSCULAR | Status: DC

## 2024-03-30 NOTE — Progress Notes (Signed)
 Patient given discharge instructions per protocol. Medications delivered by pharmacy. Patient's property accounted for and returned to patient. Patient departed unit via wheelchair. Patient left in personal vehicle driven by mother.

## 2024-03-30 NOTE — Discharge Summary (Signed)
 Physician Discharge Summary  Jill Shaw ZOX:[id number] DOB: 1976-02-24 DOA: 03/27/2024  PCP: [name], NP  Admit date: 03/27/2024 Discharge date: 03/30/2024  Admitted From: Home Discharge disposition: Home  Recommendations at discharge:  Seizure precautions to follow as instructed Follow-up with orthopedics Dr. Abigail Abler in 2 weeks.  As needed oxycodone  for pain control Insulin  dose has been adjusted.  Ensure compliance to blood sugar monitoring and dose adjustment to prevent hypoglycemic seizure.   Brief narrative: Jill Shaw is a 48 y.o. female with PMH significant for morbid obesity, OSA/OHS, DM2, HTN, diastolic CHF, CKD, COPD, seizure due to hypoglycemia Recently hospitalized in April for acute next session of CHF.  6/12, patient was brought to the ED from home for seizures. Patient mother heard noise from patient's room and found her to be having seizure.  EMS noted low blood sugar of 34, gave IV dextrose  push.  Procedure stopped, mental status improved and was brought to ED Patient had seizure due to hypoglycemia in the past as well.  Did not get seen by neurologist or restarted on AED.  Reports compliance to insulin  and dietary discretion.  No change in diet habits  In the ED, patient also complained of right shoulder pain Labs showed potassium 3.1, creatinine 1.15, WBC of 11.2 Right shoulder x-ray showed right humerus fracture,  BG has improved after multiple D50 pushes, but eventually she required D10 W at 100 cc/h Admitted to [facility]  Subjective: Patient was seen and examined this morning. Middle-aged African-American female.  Morbidly obese.  Sitting up at the edge of the bed. Not in distress. Family not at bedside  Chart reviewed In the last 24 hours, afebrile, blood pressure in 150s 160s, again on room air Blood sugar level running up to 100% today Labs from this morning with no remarkable change.  Assessment and plan: Generalized tonic-clonic  seizure H/o seizure due to hypoglycemia Patient reports history of seizures in the past, only with hypoglycemia.  Presented with yet another episode of GTCS in the setting of hypoglycemia. Previous hospitalist discussed with neurology, currently no indication for antiepileptic drugs. 6/12 MRI brain without evidence of acute intracranial abnormality.  Chronic microvascular ischemic disease noted, progressed from 01/2020 Currently on seizure precautions   R humerus fracture  In the setting of seizure. Orthopedics consulted. Plan for non-operative management with sling NWB.  Will need F/u with Dr. Abigail Abler in 2 weeks.  Pain control with as needed oxycodone , as needed Dilaudid   Type 2 diabetes mellitus A1c 9.2 on 03/27/2024 which is worse than 8.9 from a month ago PTA meds-60 units of Toujeo  and 60 units Premeal 3 times daily.  Also on Jardiance  Currently on Semglee  45 units nightly, scheduled NovoLog  15 units 3 times daily and sliding scale insulin .   Patient has been counseled about regular checking her blood sugar level analysis insulin  dose to avoid further hypoglycemic seizure. Recent Labs  Lab 03/29/24 1131 03/29/24 1632 03/29/24 2219 03/30/24 0417 03/30/24 0752  GLUCAP 291* 289* 281* 321* 252*   CKD 3a Creatinine at baseline Recent Labs    02/02/24 1556 02/04/24 0239 02/05/24 1037 02/06/24 0548 02/07/24 0503 02/25/24 1444 03/27/24 0549 03/28/24 0648 03/29/24 0527 03/30/24 0840  BUN 15 13 17  26* 32* 15 10 6 11 12   CREATININE 1.38* 1.75* 1.50* 1.69* 1.43* 1.56* 1.15* 1.08* 1.16* 1.12*   Chronic pain syndrome Has chronic back pain from a vertebral compression fracture. Has knee osteoarthritis and neuropathy  On oxycodone  PRN as before  Mild normocytic anemia  Ferritin level low at 30.  Started on PO iron  supplementation  Recent Labs    07/29/23 0207 07/30/23 0344 02/25/24 1444 03/27/24 0549 03/28/24 0648 03/29/24 0527 03/30/24 0840  HGB  --    < > 14.4 12.8  11.6* 11.9* 11.8*  MCV  --    < > 87 85.1 84.9 85.4 84.8  VITAMINB12 162*  --   --   --   --   --   --   FOLATE  --   --   --   --   --  10.5  --   FERRITIN  --   --   --   --   --  30  --   TIBC  --   --   --   --   --  318  --   IRON   --   --   --   --   --  41  --    < > = values in this interval not displayed.    Concern for OSA/OHS Being evaluated for cpap vs Bipap outpaitent   Morbid Obesity  Body mass index is 51.35 kg/m. Patient has been advised to make an attempt to improve diet and exercise patterns to aid in weight loss.    Gastroparesis 2/2 diabetes Reglan  as needed    COPD Continue ICS LABA    Mininal non obs CAD, HLD Continue statin    HTN Diastolic HF CHF not in exacerbation  Blood pressure currently controlled on Toprol  100 mg daily, Lasix  40 mg daily, hydralazine  25 mg 3 times daily    Mobility: PT recommended outpatient PT  Goals of care   Code Status: Full Code   Diet:  Diet Order             Diet general           Diet regular Room service appropriate? Yes; Fluid consistency: Thin  Diet effective now                   Nutritional status:  Body mass index is 51.35 kg/m.       Wounds:  -    Discharge Exam:   Vitals:   03/29/24 2011 03/30/24 0020 03/30/24 0504 03/30/24 0756  BP: (!) 188/65 (!) 166/70 (!) 160/63 (!) 155/79  Pulse:  63 65 64  Resp:  18 18 16   Temp: 98.5 F (36.9 C) 98.4 F (36.9 C) 98.5 F (36.9 C) 97.6 F (36.4 C)  TempSrc: Oral Oral Oral Oral  SpO2: 98% 100% 98% 100%  Weight:      Height:        Body mass index is 51.35 kg/m.  General exam: Pleasant, morbidly obese African-American female Skin: No rashes, lesions or ulcers. HEENT: Atraumatic, normocephalic, no obvious bleeding Lungs: Clear to auscultation bilaterally,  CVS: S1, S2, no murmur,   GI/Abd: Soft, nontender, distended from obesity, bowel sound present,   CNS: Alert, awake, oriented x 3 Psychiatry: Mood appropriate,  Extremities: Trace  bilateral pedal edema, no calf tenderness,   Follow ups:    Follow-up Information     [name], NP Follow up.   Specialties: Pulmonary Disease, Endocrinology Contact information: [address] [place] [address] [phone]                 Discharge Instructions:   Discharge Instructions     Ambulatory referral to Nutrition and Diabetic Education  Complete by: As directed    Call MD for:  difficulty breathing, headache or visual disturbances   Complete by: As directed    Call MD for:  extreme fatigue   Complete by: As directed    Call MD for:  hives   Complete by: As directed    Call MD for:  persistant dizziness or light-headedness   Complete by: As directed    Call MD for:  persistant nausea and vomiting   Complete by: As directed    Call MD for:  severe uncontrolled pain   Complete by: As directed    Call MD for:  temperature >100.4   Complete by: As directed    Diet general   Complete by: As directed    Discharge instructions   Complete by: As directed    Recommendations at discharge:   Seizure precautions to follow as instructed  Follow-up with orthopedics Dr. Abigail Abler in 2 weeks.  As needed oxycodone  for pain control  Insulin  dose has been adjusted.  Ensure compliance to blood sugar monitoring and dose adjustment to prevent hypoglycemic seizure.  Discharge instructions for diabetes mellitus: Check blood sugar 3 times a day and bedtime at home. If blood sugar running above 200 or less than 70 please call your MD to adjust insulin . If you notice signs and symptoms of hypoglycemia (low blood sugar) like jitteriness, confusion, thirst, tremor and sweating, please check blood sugar, drink sugary drink/biscuits/sweets to increase sugar level and call MD or return to ER.      Seizure precautions: -Per   DMV statutes, patients with seizures are not allowed to drive until they have been seizure-free for six months.  -Use  caution when using heavy equipment or power tools.  -Avoid working on ladders or at heights.  -Take showers instead of baths. Ensure the water temperature is not too high on the home water heater.  -Do not go swimming alone.  -Do not lock yourself in a room alone (i.e. bathroom). -When caring for infants or small children, sit down when holding, feeding, or changing them to minimize risk of injury to the child in the event you have a seizure.  -Maintain good sleep hygiene. -Avoid alcohol.    If patient has another seizure, call 911 and bring them back to the ED if: A.  The seizure lasts longer than 5 minutes.      B.  The patient doesn't wake shortly after the seizure or has new problems such as difficulty seeing, speaking or moving following the seizure C.  The patient was injured during the seizure D.  The patient has a temperature over 102 F (39C) E.  The patient vomited during the seizure and now is having trouble breathing      PDMP reviewed this encounter.   Opioid taper instructions: It is important to wean off of your opioid medication as soon as possible. If you do not need pain medication after your surgery it is ok to stop day one. Opioids include: Codeine , Hydrocodone (Norco, Vicodin), Oxycodone (Percocet, oxycontin ) and hydromorphone  amongst others.  Long term and even short term use of opiods can cause: Increased pain response Dependence Constipation Depression Respiratory depression And more.  Withdrawal symptoms can include Flu like symptoms Nausea, vomiting And more Techniques to manage these symptoms Hydrate well Eat regular healthy meals Stay active Use relaxation techniques(deep breathing, meditating, yoga) Do Not substitute Alcohol to help with tapering If you have been on opioids for less than two weeks and do not  have pain than it is ok to stop all together.  Plan to wean off of opioids This plan should start within one week post op of your joint  replacement. Maintain the same interval or time between taking each dose and first decrease the dose.  Cut the total daily intake of opioids by one tablet each day Next start to increase the time between doses. The last dose that should be eliminated is the evening dose.        General discharge instructions: Follow with Primary MD [name], NP in 7 days  Please request your PCP  to go over your hospital tests, procedures, radiology results at the follow up. Please get your medicines reviewed and adjusted.  Your PCP may decide to repeat certain labs or tests as needed. Do not drive, operate heavy machinery, perform activities at heights, swimming or participation in water activities or provide baby sitting services if your were admitted for syncope or siezures until you have seen by Primary MD or a Neurologist and advised to do so again. [employer]  Controlled Substance Reporting System database was reviewed. Do not drive, operate heavy machinery, perform activities at heights, swim, participate in water activities or provide baby-sitting services while on medications for pain, sleep and mood until your outpatient physician has reevaluated you and advised to do so again.  You are strongly recommended to comply with the dose, frequency and duration of prescribed medications. Activity: As tolerated with Full fall precautions use walker/cane & assistance as needed Avoid using any recreational substances like cigarette, tobacco, alcohol, or non-prescribed drug. If you experience worsening of your admission symptoms, develop shortness of breath, life threatening emergency, suicidal or homicidal thoughts you must seek medical attention immediately by calling 911 or calling your MD immediately  if symptoms less severe. You must read complete instructions/literature along with all the possible adverse reactions/side effects for all the medicines you take and that have been prescribed to you.  Take any new medicine only after you have completely understood and accepted all the possible adverse reactions/side effects.  Wear Seat belts while driving. You were cared for by a hospitalist during your hospital stay. If you have any questions about your discharge medications or the care you received while you were in the hospital after you are discharged, you can call the unit and ask to speak with the hospitalist or the covering physician. Once you are discharged, your primary care physician will handle any further medical issues. Please note that NO REFILLS for any discharge medications will be authorized once you are discharged, as it is imperative that you return to your primary care physician (or establish a relationship with a primary care physician if you do not have one).   Increase activity slowly   Complete by: As directed        Discharge Medications:   Allergies as of 03/30/2024       Reactions   Elavil  [amitriptyline ] Other (See Comments)   Coma   Orudis [ketoprofen] Nausea And Vomiting   Desyrel  [trazodone ] Nausea And Vomiting   Tylenol  [acetaminophen ] Nausea And Vomiting   Ambien  [zolpidem  Tartrate] Nausea And Vomiting   She also reported headache   Aspirin  Nausea Only   Hydroxyzine  Hcl Rash   Motrin  [ibuprofen ] Nausea And Vomiting   Naprosyn [naproxen] Nausea And Vomiting   Neurontin [gabapentin] Nausea And Vomiting, Other (See Comments)   upset stomach   Sulfa Antibiotics Nausea And Vomiting   Ultram  [tramadol ] Nausea And Vomiting,  Other (See Comments)   stomach upset   Victoza [liraglutide] Nausea And Vomiting   Zegerid [omeprazole -sodium Bicarbonate ] Nausea And Vomiting        Medication List     STOP taking these medications    HumaLOG  KwikPen 200 UNIT/ML KwikPen Generic drug: insulin  lispro Replaced by: HumaLOG  KwikPen 100 UNIT/ML KwikPen   Toujeo  Max SoloStar 300 UNIT/ML Solostar Pen Generic drug: insulin  glargine (2 Unit Dial ) Replaced by:  Lantus  SoloStar 100 UNIT/ML Solostar Pen       TAKE these medications    albuterol  108 (90 Base) MCG/ACT inhaler Commonly known as: VENTOLIN  HFA Inhale 2 puffs into the lungs every 6 (six) hours as needed for wheezing or shortness of breath.   albuterol  (2.5 MG/3ML) 0.083% nebulizer solution Commonly known as: PROVENTIL  Take 3 mLs (2.5 mg total) by nebulization every 6 (six) hours as needed for wheezing or shortness of breath.   budesonide -formoterol  160-4.5 MCG/ACT inhaler Commonly known as: Symbicort  Inhale 2 puffs into the lungs 2 (two) times daily.   busPIRone  10 MG tablet Commonly known as: BUSPAR  Take 1 tablet (10 mg total) by mouth 2 (two) times daily.   Dexlansoprazole  30 MG capsule DR Take 1 capsule (30 mg total) by mouth daily. NEEDS OFFICE VISIT FOR ADDITIONAL REFILLS   diclofenac  Sodium 1 % Gel Commonly known as: VOLTAREN  Apply 2 to 4 gram to painful sites up to 4 times daily if needed, max daily dose: 32 Gram What changed: Another medication with the same name was removed. Continue taking this medication, and follow the directions you see here.   dicyclomine  20 MG tablet Commonly known as: BENTYL  Take 1 tablet (20 mg total) by mouth 2 (two) times daily.   FeroSul 325 (65 Fe) MG tablet Generic drug: ferrous sulfate  Take 1 tablet (325 mg total) by mouth daily with breakfast.   FreeStyle Libre 3 Sensor Misc change sensor every 14 days   furosemide  40 MG tablet Commonly known as: LASIX  Take 1 tablet (40 mg total) by mouth daily.   HumaLOG  KwikPen 100 UNIT/ML KwikPen Generic drug: insulin  lispro Inject 15 Units into the skin with breakfast, with lunch, and with evening meal. Replaces: HumaLOG  KwikPen 200 UNIT/ML KwikPen   hydrALAZINE  25 MG tablet Commonly known as: APRESOLINE  Take 1 tablet (25 mg total) by mouth 3 (three) times daily.   ibuprofen  600 MG tablet Commonly known as: ADVIL  Take 1 tablet (600 mg total) by mouth every 8 (eight) hours as  needed for up to 7 days for mild pain (pain score 1-3). What changed:  medication strength how much to take when to take this reasons to take this   Insupen Pen Needles 32G X 4 MM Misc Generic drug: Insulin  Pen Needle Use as directed in the morning, at noon, in the evening, and at bedtime.   Jardiance  10 MG Tabs tablet Generic drug: empagliflozin  Take 1 tablet (10 mg total) by mouth daily.   Lantus  SoloStar 100 UNIT/ML Solostar Pen Generic drug: insulin  glargine Inject 45 Units into the skin at bedtime. Replaces: Toujeo  Max SoloStar 300 UNIT/ML Solostar Pen   levocetirizine 5 MG tablet Commonly known as: XYZAL Take 5 mg by mouth every evening.   Melatonin 10 MG Tabs Take 10 mg by mouth at bedtime as needed (insomnia).   metoCLOPramide  10 MG tablet Commonly known as: REGLAN  Take 1 tablet (10 mg total) by mouth every 8 (eight) hours as needed for nausea.   metoprolol  succinate 100 MG 24 hr tablet Commonly known  as: TOPROL -XL Take 1 tablet (100 mg total) by mouth daily. Take with or immediately following a meal.   naloxone  4 MG/0.1ML Liqd nasal spray kit Commonly known as: NARCAN  Insert 1 spray into one nostril if poorly responding / turning blue. CALL 911 ASAP   nitroGLYCERIN  0.4 MG SL tablet Commonly known as: Nitrostat  Place 1 tablet (0.4 mg total) under the tongue every 5 (five) minutes as needed for chest pain. Additional refills to be filled by PCP, patient aware   norethindrone  5 MG tablet Commonly known as: AYGESTIN  Take 1 tablet (5 mg total) by mouth 2 (two) times daily as needed (bleeding). What changed: when to take this   ondansetron  4 MG disintegrating tablet Commonly known as: ZOFRAN -ODT Take 1 tablet (4 mg total) by mouth every 8 (eight) hours as needed for nausea or vomiting.   Oxycodone  HCl 10 MG Tabs Take 1 tablet (10 mg total) by mouth 4 (four) times daily as needed. What changed: when to take this   oxyCODONE  5 MG immediate release  tablet Commonly known as: Roxicodone  Take 1 tablet (5 mg total) by mouth every 6 (six) hours as needed for up to 7 days for breakthrough pain. Can take with oxycodone  10mg  if pain is severe. What changed: You were already taking a medication with the same name, and this prescription was added. Make sure you understand how and when to take each.   potassium chloride  SA 20 MEQ tablet Commonly known as: KLOR-CON  M Take 1 tablet (20 mEq total) by mouth daily.   pregabalin  100 MG capsule Commonly known as: LYRICA  Take 1 (one) capsule by mouth three times daily, Decrease to twice daily if feel off balance or overmedicated.   rosuvastatin  20 MG tablet Commonly known as: CRESTOR  TAKE 1 TABLET(20 MG) BY MOUTH DAILY   senna 8.6 MG Tabs tablet Commonly known as: SENOKOT Take 1 tablet (8.6 mg total) by mouth daily.   spironolactone  25 MG tablet Commonly known as: ALDACTONE  Take 1 tablet (25 mg total) by mouth daily.   sucralfate  1 g tablet Commonly known as: Carafate  Take 1 tablet (1 g total) by mouth 4 (four) times daily -  with meals and at bedtime.   tizanidine  6 MG capsule Commonly known as: ZANAFLEX  Take 6 mg by mouth 2 (two) times daily as needed for muscle spasms.         The results of significant diagnostics from this hospitalization (including imaging, microbiology, ancillary and laboratory) are listed below for reference.    Procedures and Diagnostic Studies:   CT SHOULDER RIGHT WO CONTRAST Result Date: 03/28/2024 CLINICAL DATA:  Seizure.  Shoulder trauma. EXAM: CT OF THE UPPER RIGHT EXTREMITY WITHOUT CONTRAST TECHNIQUE: Multidetector CT imaging of the upper right extremity was performed according to the standard protocol. RADIATION DOSE REDUCTION: This exam was performed according to the departmental dose-optimization program which includes automated exposure control, adjustment of the mA and/or kV according to patient size and/or use of iterative reconstruction technique.  COMPARISON:  Right shoulder radiographs dated 03/27/2024. FINDINGS: Bones/Joint/Cartilage Comminuted fracture of the right humeral head at the level of the anatomical neck with fracture margins extending to the greater tuberosity. There is approximately 15 mm of impaction laterally at the level of the greater tuberosity with 3 mm of medial displacement of the distal fracture component. The medial fracture margin extends just below the level of the inferior articulating surface of the humeral head. No evidence of dislocation. Acromioclavicular joint is anatomically aligned with mild degenerative  changes. Ligaments Ligaments are suboptimally evaluated by CT. Muscles and Tendons Muscles are normal. No muscle atrophy. No intramuscular fluid collection or hematoma. Soft tissue No fluid collection or hematoma.  No soft tissue mass. IMPRESSION: Comminuted, impacted, and mildly displaced fracture of the right humeral head at the level of the anatomical neck with fracture margins extending to the greater tuberosity. The medial fracture margin appears to extend just below the level of the inferior articulating surface of the humeral head. Electronically Signed   By: Mannie Seek M.D.   On: 03/28/2024 15:22   MR BRAIN WO CONTRAST Result Date: 03/27/2024 CLINICAL DATA:  Initial evaluation for acute seizure. EXAM: MRI HEAD WITHOUT CONTRAST TECHNIQUE: Multiplanar, multiecho pulse sequences of the brain and surrounding structures were obtained without intravenous contrast. COMPARISON:  Comparison made with CT from 02/04/2024 and brain MRI from 01/30/2020. FINDINGS: Brain: Cerebral volume within normal limits for age. Few patchy foci of T2/FLAIR hyperintensity noted involving the periventricular deep white matter both cerebral hemispheres, nonspecific, but most commonly related to chronic microvascular ischemic disease. Changes are mild in nature. These changes are new and/or progressed from prior brain MRI from 2021. No  abnormal foci of restricted diffusion to suggest acute or subacute ischemia. Gray-white matter differentiation well maintained. No encephalomalacia to suggest chronic cortical infarction or other insult. No foci of susceptibility artifact indicative of acute or chronic intracranial blood products. No mass lesion, midline shift or mass effect. Ventricles normal in size and morphology without hydrocephalus. No extra-axial fluid collection. Pituitary gland and suprasellar region within normal limits. Vascular: Major intracranial vascular flow voids are well maintained. Skull and upper cervical spine: Craniocervical junction within normal limits. Decreased T1 signal intensity noted within the visualized bone marrow, nonspecific, but most commonly related to anemia, smoking, or obesity. Sinuses/Orbits: Prior bilateral ocular lens replacement. Mild scattered mucosal thickening noted about the ethmoidal air cells. Paranasal sinuses are otherwise largely clear. No significant mastoid effusion. Other: None. IMPRESSION: 1. No acute intracranial abnormality. 2. Mild cerebral white matter disease, nonspecific, but most commonly related to chronic microvascular ischemic disease. Changes are new and/or progressed as compared to prior brain MRI from 01/30/2020. Electronically Signed   By: Virgia Griffins M.D.   On: 03/27/2024 18:55   DG Ankle Left Port Result Date: 03/27/2024 CLINICAL DATA:  Left ankle pain. EXAM: PORTABLE LEFT ANKLE - 2 VIEW COMPARISON:  None Available. FINDINGS: No acute fracture or dislocation. The ankle mortise is intact. There is diffuse subcutaneous edema. No radiopaque foreign object or soft tissue gas. IMPRESSION: 1. No acute fracture or dislocation. 2. Diffuse subcutaneous edema. Electronically Signed   By: Angus Bark M.D.   On: 03/27/2024 14:23   EEG adult Result Date: 03/27/2024 Arleene Lack, MD     03/27/2024 12:35 PM Patient Name: SKYLEE BAIRD MRN: [id number] Epilepsy Attending:  Arleene Lack Referring Physician/Provider: Epifanio Haste, MD Date: 03/27/2024 Duration: 25.09 mins Patient history: 48yo F with seizure in the setting of hypoglycemia. EEG to evaluate for seizure. Level of alertness: Awake AEDs during EEG study: PGB Technical aspects: This EEG study was done with scalp electrodes positioned according to the 10-20 International system of electrode placement. Electrical activity was reviewed with band pass filter of 1-70Hz , sensitivity of 7 uV/mm, display speed of 65mm/sec with a 60Hz  notched filter applied as appropriate. EEG data were recorded continuously and digitally stored.  Video monitoring was available and reviewed as appropriate. Description: The posterior dominant rhythm consists of 8 Hz activity of  moderate voltage (25-35 uV) seen predominantly in posterior head regions, symmetric and reactive to eye opening and eye closing. EEG showed intermittent generalized 3 to 6 Hz theta-delta slowing. Physiologic photic driving was not seen during photic stimulation.  Hyperventilation was not performed.   ABNORMALITY - Intermittent slow, generalized IMPRESSION: This study is suggestive of mild diffuse encephalopathy. No seizures or epileptiform discharges were seen throughout the recording. Arleene Lack   DG Shoulder Right Result Date: 03/27/2024 CLINICAL DATA:  Seizure.  Shoulder pain. EXAM: RIGHT SHOULDER - 2+ VIEW COMPARISON:  None Available. FINDINGS: Exam is markedly limited by patient positioning. Within this limitation findings are compatible with a comminuted humeral neck fracture. No evidence for shoulder dislocation or separation. IMPRESSION: Comminuted fracture of the proximal humerus, in the region of the anatomical neck. Electronically Signed   By: Donnal Fusi M.D.   On: 03/27/2024 06:37     Labs:   Basic Metabolic Panel: Recent Labs  Lab 03/27/24 0549 03/28/24 0648 03/29/24 0527 03/30/24 0840  NA 141 140 141 140  K 3.1* 3.5 4.1 4.0  CL  107 111 109 109  CO2 19* 21* 23 25  GLUCOSE 92 161* 359* 264*  BUN 10 6 11 12   CREATININE 1.15* 1.08* 1.16* 1.12*  CALCIUM  9.5 8.8* 9.1 9.0  MG  --   --  1.6* 1.6*  PHOS  --   --  3.6  --    GFR Estimated Creatinine Clearance: 85.4 mL/min (A) (by C-G formula based on SCr of 1.12 mg/dL (H)). Liver Function Tests: Recent Labs  Lab 03/27/24 0549  AST 20  ALT 8  ALKPHOS 60  BILITOT 0.4  PROT 6.2*  ALBUMIN 3.5   No results for input(s): LIPASE, AMYLASE in the last 168 hours. No results for input(s): AMMONIA in the last 168 hours. Coagulation profile No results for input(s): INR, PROTIME in the last 168 hours.  CBC: Recent Labs  Lab 03/27/24 0549 03/28/24 0648 03/29/24 0527 03/30/24 0840  WBC 11.2* 7.6 6.9 7.9  NEUTROABS 8.4*  --   --   --   HGB 12.8 11.6* 11.9* 11.8*  HCT 38.3 35.4* 35.8* 35.7*  MCV 85.1 84.9 85.4 84.8  PLT 152 151 144* 166   Cardiac Enzymes: Recent Labs  Lab 03/27/24 0549  CKTOTAL 118   BNP: Invalid input(s): POCBNP CBG: Recent Labs  Lab 03/29/24 1131 03/29/24 1632 03/29/24 2219 03/30/24 0417 03/30/24 0752  GLUCAP 291* 289* 281* 321* 252*   D-Dimer No results for input(s): DDIMER in the last 72 hours. Hgb A1c No results for input(s): HGBA1C in the last 72 hours. Lipid Profile No results for input(s): CHOL, HDL, LDLCALC, TRIG, CHOLHDL, LDLDIRECT in the last 72 hours. Thyroid  function studies No results for input(s): TSH, T4TOTAL, T3FREE, THYROIDAB in the last 72 hours.  Invalid input(s): FREET3 Anemia work up Recent Labs    03/29/24 0527  FOLATE 10.5  FERRITIN 30  TIBC 318  IRON  41   Microbiology Recent Results (from the past 240 hours)  MRSA Next Gen by PCR, Nasal     Status: None   Collection Time: 03/27/24 10:52 AM   Specimen: Nasal Mucosa; Nasal Swab  Result Value Ref Range Status   MRSA by PCR Next Gen NOT DETECTED NOT DETECTED Final    Comment: (NOTE) The GeneXpert MRSA Assay  (FDA approved for NASAL specimens only), is one component of a comprehensive MRSA colonization surveillance program. It is not intended to diagnose MRSA infection nor to guide  or monitor treatment for MRSA infections. Test performance is not FDA approved in patients less than 57 years old. Performed at [facility], [address], [place], [place] [address]     Time coordinating discharge: 45 minutes  Signed: Rodolfo Notaro  [facility] 03/30/2024, 11:40 AM

## 2024-03-31 ENCOUNTER — Ambulatory Visit: Payer: Self-pay | Admitting: Nurse Practitioner

## 2024-03-31 ENCOUNTER — Other Ambulatory Visit: Payer: Self-pay

## 2024-03-31 ENCOUNTER — Telehealth: Payer: Self-pay

## 2024-03-31 ENCOUNTER — Other Ambulatory Visit (HOSPITAL_COMMUNITY): Payer: Self-pay

## 2024-03-31 DIAGNOSIS — E1169 Type 2 diabetes mellitus with other specified complication: Secondary | ICD-10-CM

## 2024-03-31 MED ORDER — FREESTYLE LIBRE 3 PLUS SENSOR MISC
11 refills | Status: AC
  Filled 2024-03-31 – 2024-06-03 (×5): qty 2, 30d supply, fill #0

## 2024-03-31 NOTE — Telephone Encounter (Signed)
 Diabetes Transitions of Care   Patient referred to [facility] team due to uncontrolled diabetes on admission. Patient's primary care provider is Abbey Hobby, PCP.  Patient was recently admitted at [facility] for a seizure from 03/27/24 to 03/30/24 in the setting of seizure 2/2 hypoglycemia (BG of 34 mg/dL). This is a recurrent seizure in the setting of hypoglycemia. She did have a R humerus fracture due to the seizure. She was discharged with reduced dose of insulin  (PTA was on Toujeo  U300 60 units daily and Humalog  U200 60 units TID before meals). She was also given a 14 day supply of a CGM (Freestyle Thermal) at discharge.   Current antihyperglycemics: Lantus  (insulin  glargine) 45 units daily, Humalog  (insulin  lispro) 15 units three times daily before meals, Jardiance  10 mg daily  Lab Results  Component Value Date   HGBA1C 9.2 (H) 03/27/2024   HGBA1C 8.9 (A) 02/25/2024   HGBA1C 9.4 (A) 11/26/2023    Unsuccessful outreach to patient to discuss diabetes control and medication management. Left message encouraging patient to call back if she has any immediate questions or concerns about her diabetes medications.   Will communicate the following recommendations to her PCP - Should consider an A1C goal of < 8% given recurrent seizures in the setting of hypoglycemia - Strongly encourage continued use of CGM for safety and hypoglycemia alarms - Titrate insulin  slowly if necessary and counsel patient on strategies to avoid hypoglycemia (eating regular meals, avoiding insulin  stacking, increasing protein intake).   Follow-up:   PCP follow-up 04/09/24 Pharmacist follow-up 7/14/5 (telephone)  Arthea Larsson, PharmD PGY1 Pharmacy Resident

## 2024-03-31 NOTE — Transitions of Care (Post Inpatient/ED Visit) (Signed)
   03/31/2024  Name: Jill Shaw MRN: [id number] DOB: [birth year]  Today's TOC FU Call Status: Today's TOC FU Call Status:: Unsuccessful Call (1st Attempt) Unsuccessful Call (1st Attempt) Date: 03/31/24  Attempted to reach the patient regarding the most recent Inpatient/ED visit.  Follow Up Plan: Additional outreach attempts will be made to reach the patient to complete the Transitions of Care (Post Inpatient/ED visit) call.   Tonia Frankel RN, CCM [place]  VBCI-Population Health RN Care Manager [phone]

## 2024-04-01 ENCOUNTER — Other Ambulatory Visit: Payer: Self-pay

## 2024-04-01 ENCOUNTER — Other Ambulatory Visit (HOSPITAL_COMMUNITY): Payer: Self-pay

## 2024-04-01 ENCOUNTER — Telehealth: Payer: Self-pay

## 2024-04-01 ENCOUNTER — Encounter (HOSPITAL_BASED_OUTPATIENT_CLINIC_OR_DEPARTMENT_OTHER): Payer: Self-pay

## 2024-04-01 NOTE — Transitions of Care (Post Inpatient/ED Visit) (Signed)
   04/01/2024  Name: Jill Shaw MRN: [id number] DOB: Sep 14, 1976  Today's TOC FU Call Status: Today's TOC FU Call Status:: Unsuccessful Call (2nd Attempt) Unsuccessful Call (2nd Attempt) Date: 04/01/24  Attempted to reach the patient regarding the most recent Inpatient/ED visit.  Follow Up Plan: Additional outreach attempts will be made to reach the patient to complete the Transitions of Care (Post Inpatient/ED visit) call.  Tonia Frankel RN, CCM [place]  VBCI-Population Health RN Care Manager [phone]

## 2024-04-02 ENCOUNTER — Other Ambulatory Visit: Payer: Self-pay

## 2024-04-02 ENCOUNTER — Other Ambulatory Visit (HOSPITAL_COMMUNITY): Payer: Self-pay

## 2024-04-02 ENCOUNTER — Telehealth: Payer: Self-pay

## 2024-04-02 NOTE — Transitions of Care (Post Inpatient/ED Visit) (Signed)
   04/02/2024  Name: Jill Shaw MRN: [id number] DOB: [birth year]  Today's TOC FU Call Status: Today's TOC FU Call Status:: Unsuccessful Call (3rd Attempt) Unsuccessful Call (3rd Attempt) Date: 04/02/24  Attempted to reach the patient regarding the most recent Inpatient/ED visit.  Follow Up Plan: No further outreach attempts will be made at this time. We have been unable to contact the patient.  Tonia Frankel RN, CCM [place]  VBCI-Population Health RN Care Manager [phone]

## 2024-04-03 ENCOUNTER — Telehealth: Payer: Self-pay

## 2024-04-03 ENCOUNTER — Other Ambulatory Visit: Payer: Self-pay | Admitting: Nurse Practitioner

## 2024-04-03 ENCOUNTER — Institutional Professional Consult (permissible substitution) (HOSPITAL_BASED_OUTPATIENT_CLINIC_OR_DEPARTMENT_OTHER): Admitting: Family

## 2024-04-03 ENCOUNTER — Other Ambulatory Visit: Payer: Self-pay

## 2024-04-03 MED ORDER — INSULIN LISPRO (1 UNIT DIAL) 100 UNIT/ML (KWIKPEN)
15.0000 [IU] | PEN_INJECTOR | Freq: Three times a day (TID) | SUBCUTANEOUS | 3 refills | Status: DC
  Filled 2024-04-03: qty 15, 34d supply, fill #0

## 2024-04-03 NOTE — Telephone Encounter (Signed)
 Copied from CRM [id number]. Topic: Clinical - Medication Question >> Apr 03, 2024 12:24 PM Marissa P wrote: Reason for CRM: Patient was discharged on Sunday, she is calling needing insulin  lispro (HUMALOG  KWIKPEN) 100 UNIT/ML KwikPen, requesting a higher dose as she does take it 3 times a day already please. Also, stated she already had her follow up appointment.

## 2024-04-07 IMAGING — CT CT HEAD CODE STROKE
3 series · 14 of 47 positions shown, 16 images · non-contrast
Comparison: CT head 08/22/2021

CLINICAL DATA: Code stroke.  Weakness



[Series 3: head 5.0 st · axial · 0.50mm/px · z∈[-138,-8]mm · 8 of 32 slices shown, 10 images]
[im 3/32  brain]
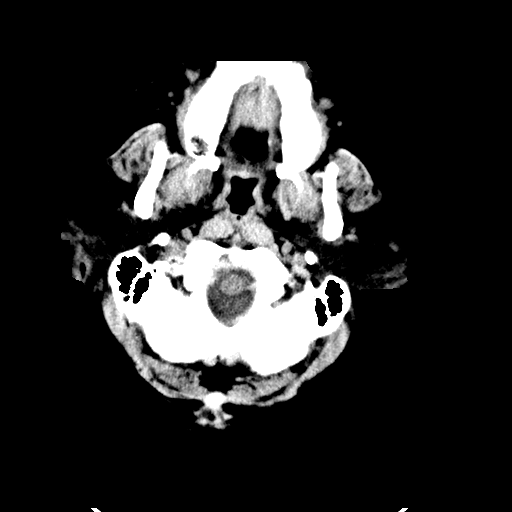
[im 3/32  bone]
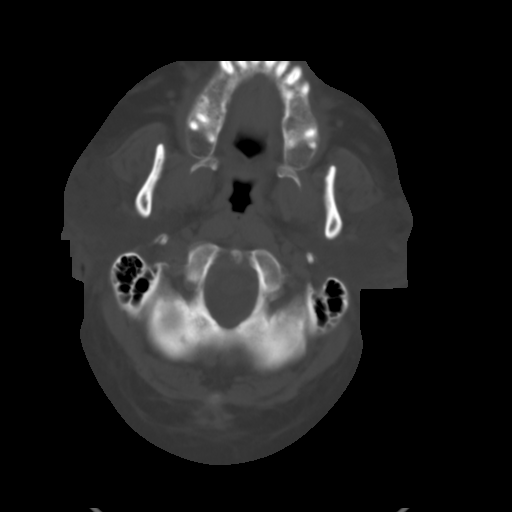
[im 7/32  brain]
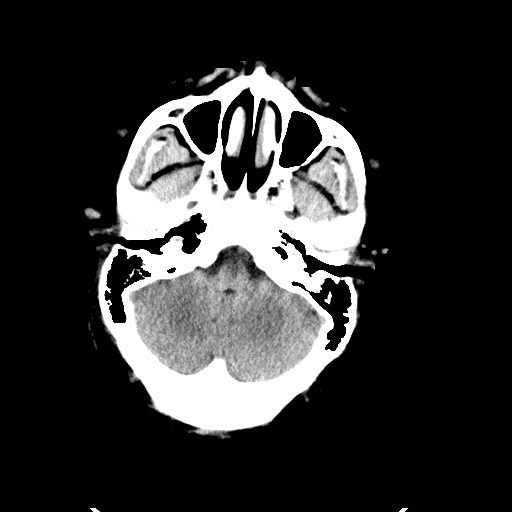
[im 10/32  brain]
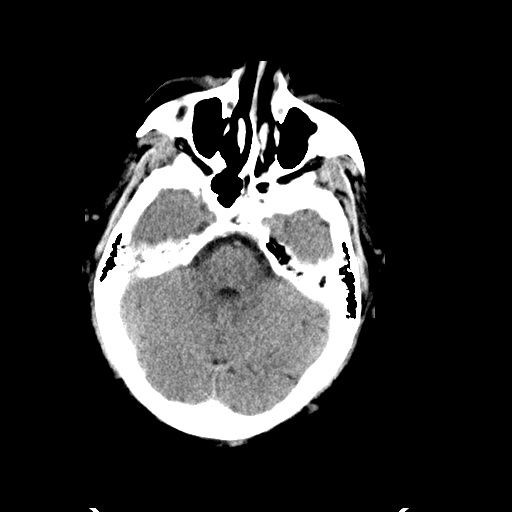
[im 14/32  brain]
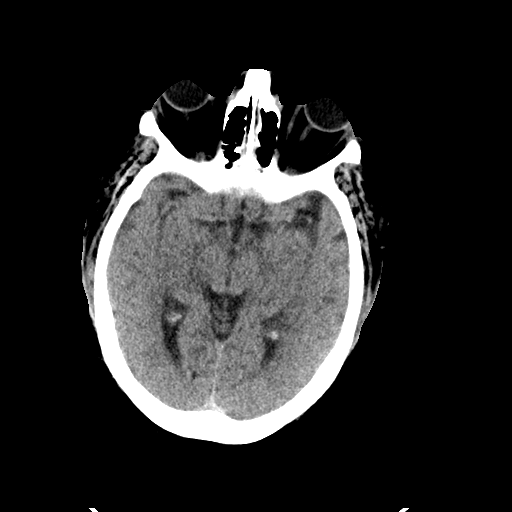
[im 18/32  brain]
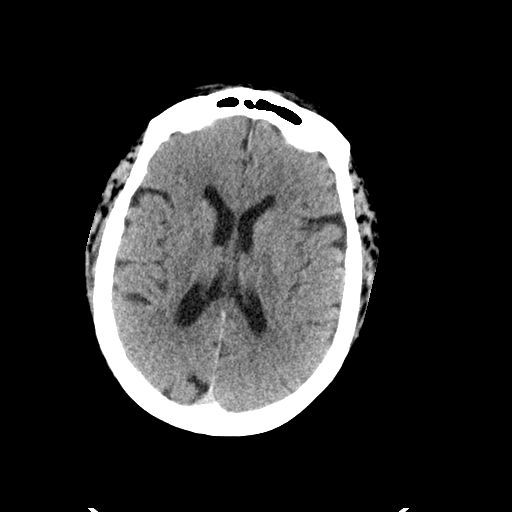
[im 18/32  bone]
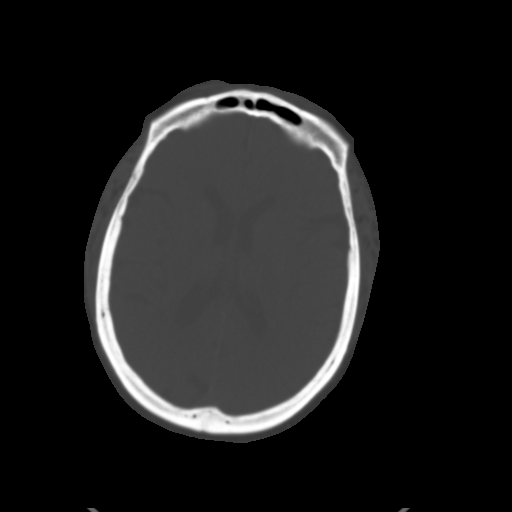
[im 22/32  brain]
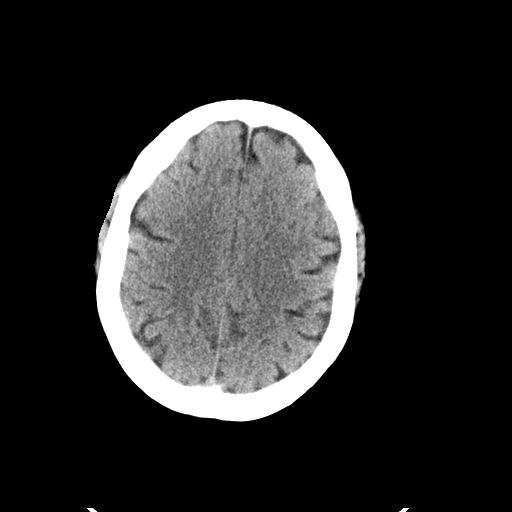
[im 25/32  brain]
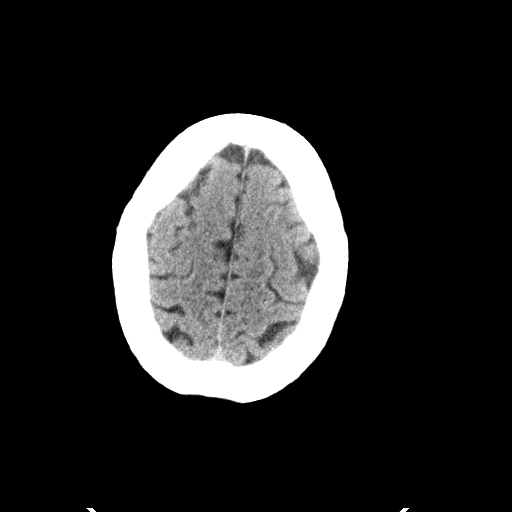
[im 29/32  brain]
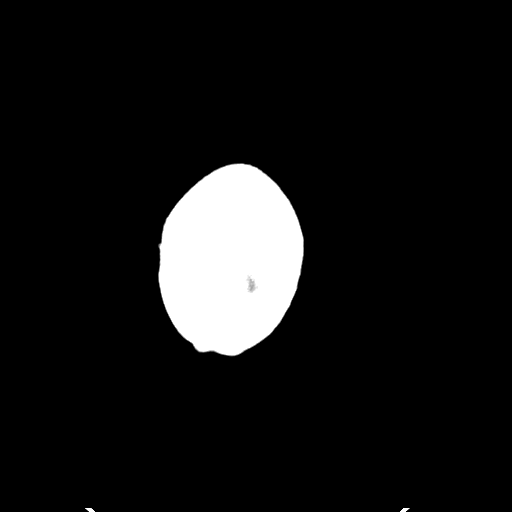

[Series 5: head 3.0 cor st · coronal · 0.31mm/px · 3 of 72 slices shown]
[im 24/72  brain]
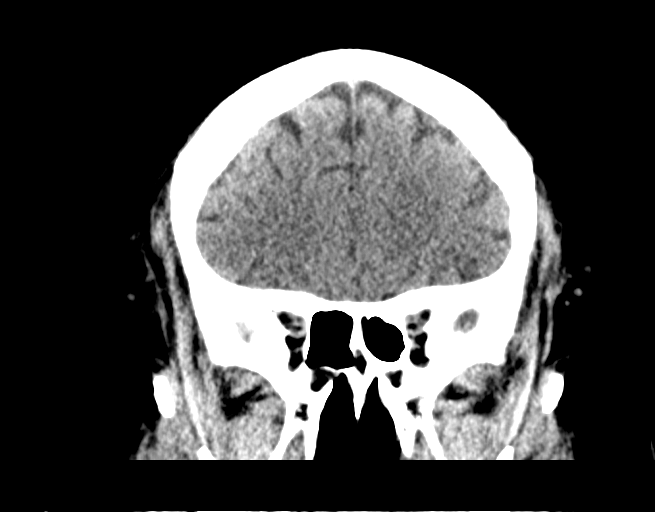
[im 32/72  brain]
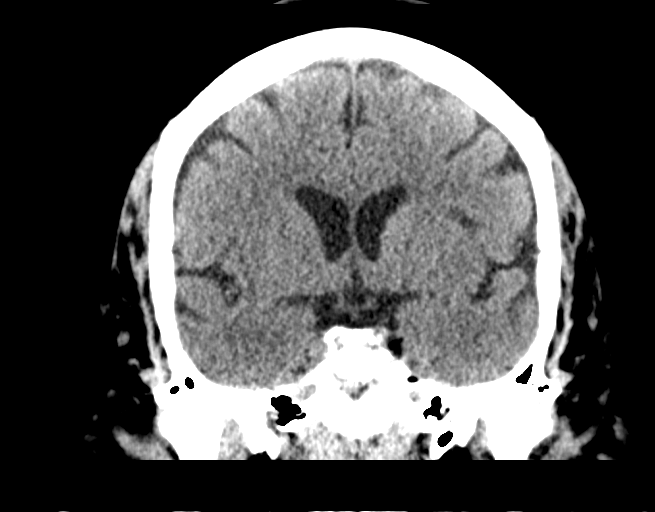
[im 40/72  brain]
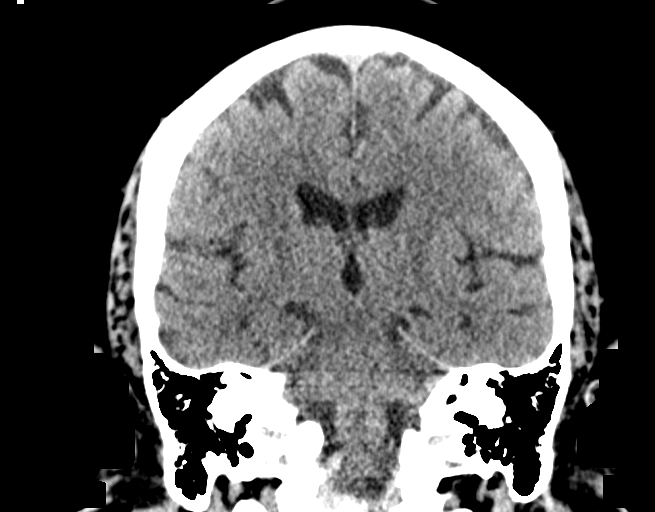

[Series 6: head 3.0 sag st · sagittal · 0.31mm/px · 3 of 67 slices shown]
[im 23/67  brain]
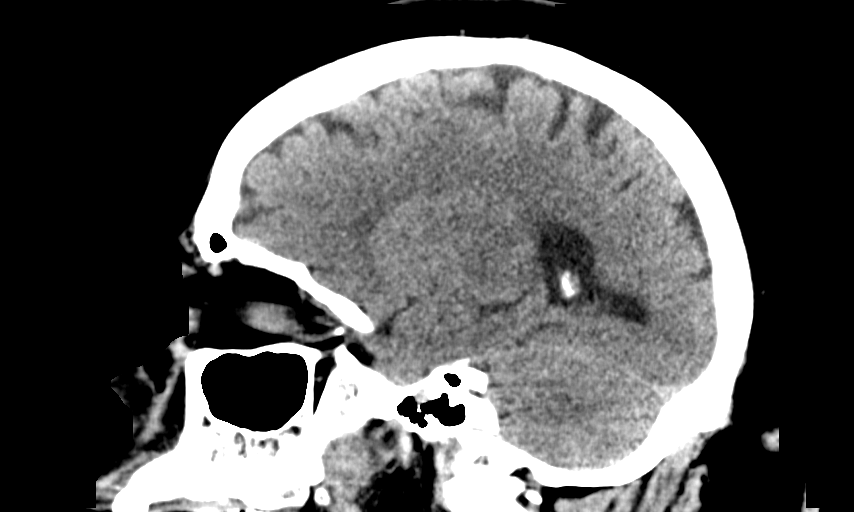
[im 34/67  brain]
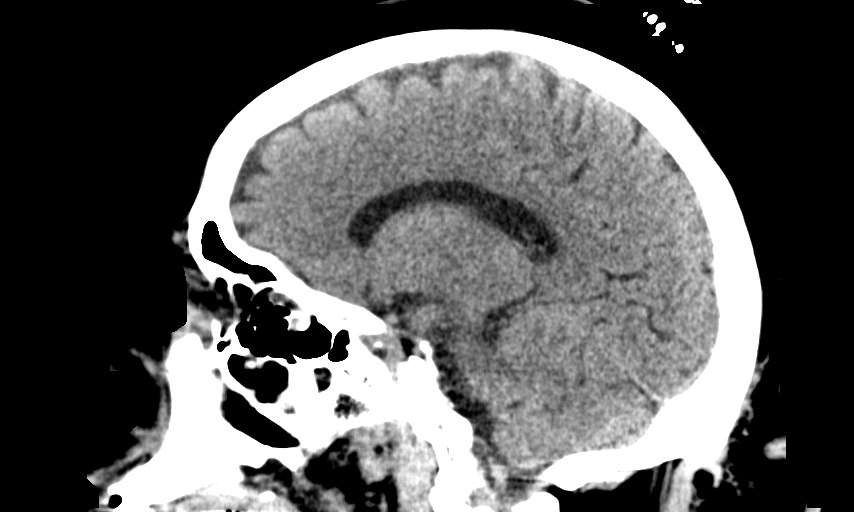
[im 45/67  brain]
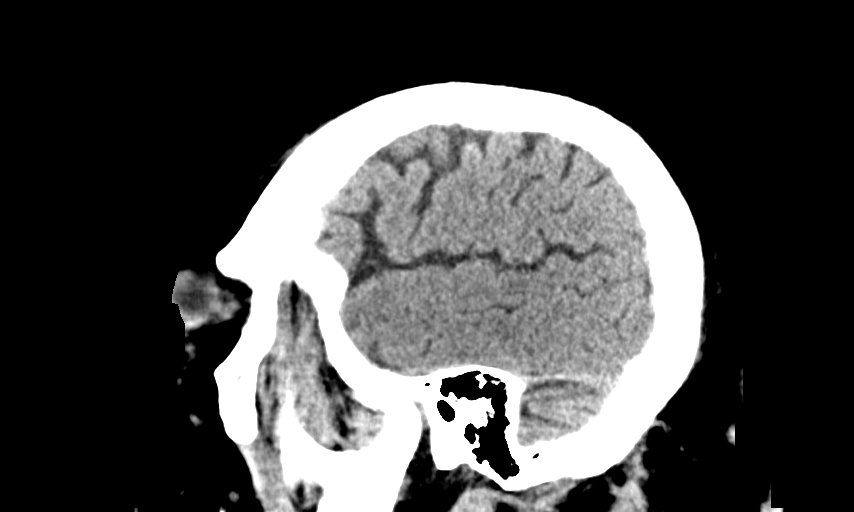

[14 of 47 positions shown; findings below may reference images not displayed]

FINDINGS: Brain: There is no evidence of acute intracranial hemorrhage,
extra-axial fluid collection, or acute infarct.

Parenchymal volume is normal. The ventricles are normal in size.
Gray-white differentiation is preserved.

There is no mass lesion.  There is no mass effect or midline shift.

Vascular: No hyperdense vessel or unexpected calcification.

Skull: Normal. Negative for fracture or focal lesion.

Sinuses/Orbits: The paranasal sinuses and mastoid air cells are
clear. Bilateral lens implants are in place. The globes and orbits
are otherwise unremarkable.

Other: None.

ASPECTS (Alberta Stroke Program Early CT Score)

- Ganglionic level infarction (caudate, lentiform nuclei, internal
capsule, insula, M1-M3 cortex): 7

- Supraganglionic infarction (M4-M6 cortex): 3

Total score (0-10 with 10 being normal): 10
IMPRESSION: 1. No acute intracranial pathology.
2. ASPECTS is 10

These results were paged via Amion at the time of interpretation on
02/28/2022 at [DATE] to provider Dr Piuhola.

## 2024-04-09 ENCOUNTER — Inpatient Hospital Stay: Payer: Self-pay | Admitting: Nurse Practitioner

## 2024-04-09 ENCOUNTER — Telehealth: Payer: Self-pay

## 2024-04-09 NOTE — Telephone Encounter (Signed)
 Copied from CRM [id number]. Topic: Clinical - Prescription Issue >> Apr 09, 2024  9:46 AM Avram MATSU wrote: Reason for CRM: insulin  lispro (HUMALOG  KWIKPEN) 100 UNIT/ML KwikPen [[id number]] is requesting a hirer dose, she stated she has half a pen left

## 2024-04-10 ENCOUNTER — Telehealth: Payer: Self-pay

## 2024-04-10 ENCOUNTER — Other Ambulatory Visit: Payer: Self-pay

## 2024-04-10 ENCOUNTER — Other Ambulatory Visit: Payer: Self-pay | Admitting: Nurse Practitioner

## 2024-04-10 ENCOUNTER — Ambulatory Visit: Payer: Self-pay | Admitting: *Deleted

## 2024-04-10 MED ORDER — INSULIN LISPRO (1 UNIT DIAL) 100 UNIT/ML (KWIKPEN)
15.0000 [IU] | PEN_INJECTOR | Freq: Three times a day (TID) | SUBCUTANEOUS | 3 refills | Status: DC
  Filled 2024-04-10: qty 15, 34d supply, fill #0

## 2024-04-10 NOTE — Transitions of Care (Post Inpatient/ED Visit) (Signed)
 04/10/2024  Patient ID: Jill Shaw, female   DOB: 06/29/1976, 48 y.o.   MRN: [id number]  Patient left voicemail message with [facility] RN Shona Prow requesting assistance for getting her insulin .  This Clinical research associate contacted patient to assist with information and to follow up on needs.  Patient contacted and states,  I am out of my short-acting insulin  and I don't have any short acting insulin  left and I have been taking more of my long acting insulin  to get my sugar down. My blood sugar was 300 this morning. I contacted my provider yesterday and haven't heard anything back from the pharmacy or my provider. Patient was encouraged to call 911 if she feels like this is an emergency, she states, I don't want to go back to the hospital if I can get my insulin  straighten out. Medication review notes the Humalog  Kwikpen for 15 units 3 times daily with meals. Patient states she has transportation to get to pharmacy at [facility], asked if she had contacted the pharmacy and she states the pharmacy usually calls [her] when medications are there and [she] doesn't feel that this short-acting insulin  is enough for [her] blood sugars. Reached out to [facility] and spoke with D[place] about the patient request. Then spoke with [facility] regarding patient request and they will reach out to patient.   Plan: Will outreach assigned [facility] nurse regarding patient request and for potential follow up as needed.  Richerd Fish, RN, BSN, CCM [facility], [facility] RN Care Manager Direct Dial : [phone]

## 2024-04-10 NOTE — Telephone Encounter (Signed)
 FYI Only or Action Required?: Action required by provider: medication refill request.  Patient was last seen in primary care on 02/25/2024 by Oley Bascom RAMAN, NP. Called Nurse Triage reporting Medication Problem. Symptoms began several days ago. Interventions attempted: Prescription medications: Patient is requesting refill on both insulins- she is out of quick acting so has been doubling her long acting. Symptoms are: gradually worsening.  Triage Disposition: Call PCP Now  Patient/caregiver understands and will follow disposition?: See note- urgent medication need   Reason for Disposition  [1] Caller has URGENT medicine question about med that PCP or specialist prescribed AND [2] triager unable to answer question  Answer Assessment - Initial Assessment Questions 1. NAME of MEDICINE: What medicine(s) are you calling about?     Humalog  quikpen 2. QUESTION: What is your question? (e.g., double dose of medicine, side effect)     Patient needs refill of this medication- she was instructed to take 15 units 3. PRESCRIBER: Who prescribed the medicine? Reason: if prescribed by specialist, call should be referred to that group.     hospital 4. SYMPTOMS: Do you have any symptoms? If Yes, ask: What symptoms are you having?  How bad are the symptoms (e.g., mild, moderate, severe)     Elevated glucose reading  Needed: insulin  lispro (HUMALOG  KWIKPEN) 100 UNIT/ML KwikPen 15 units with meals- patient states she had to adjust at home - patient was taking 60 units with meals- patient reports that put her in 200/185/160 range.   insulin  glargine (LANTUS ) 100 UNIT/ML Solostar Pen This dosing was working to keep her in range- patient quickly ran out using the increased dose.   Patient is now taking increased lantus - doubling dose due to being out of short acting.  Patient missed her appointment due to her fractured arm.  Protocols used: Medication Question Call-A-AH   Copied from CRM  [id number]. Topic: Clinical - Red Word Triage >> Apr 10, 2024  1:35 PM Montie POUR wrote: Red Word that prompted transfer to Nurse Triage:  Sugar level is at 300 and I have [place] with Transitions of Care on the phone. [place]: [phone]

## 2024-04-10 NOTE — Telephone Encounter (Signed)
 Done [facility]

## 2024-04-14 ENCOUNTER — Other Ambulatory Visit: Payer: Self-pay | Admitting: Nurse Practitioner

## 2024-04-14 ENCOUNTER — Ambulatory Visit: Payer: Self-pay

## 2024-04-14 ENCOUNTER — Telehealth: Payer: Self-pay

## 2024-04-14 ENCOUNTER — Other Ambulatory Visit: Payer: Self-pay

## 2024-04-14 DIAGNOSIS — E1165 Type 2 diabetes mellitus with hyperglycemia: Secondary | ICD-10-CM

## 2024-04-14 MED ORDER — INSULIN LISPRO (1 UNIT DIAL) 100 UNIT/ML (KWIKPEN)
17.0000 [IU] | PEN_INJECTOR | Freq: Three times a day (TID) | SUBCUTANEOUS | 3 refills | Status: DC
  Filled 2024-04-14 (×4): qty 15, 30d supply, fill #0

## 2024-04-14 MED ORDER — METFORMIN HCL ER 500 MG PO TB24
500.0000 mg | ORAL_TABLET | Freq: Two times a day (BID) | ORAL | 1 refills | Status: DC
  Filled 2024-04-14: qty 180, 90d supply, fill #0

## 2024-04-14 MED ORDER — INSULIN GLARGINE 100 UNIT/ML SOLOSTAR PEN
49.0000 [IU] | PEN_INJECTOR | Freq: Every day | SUBCUTANEOUS | 2 refills | Status: DC
  Filled 2024-04-14 (×3): qty 15, 30d supply, fill #0

## 2024-04-14 NOTE — Telephone Encounter (Unsigned)
 Copied from CRM [id number]. Topic: Clinical - Prescription Issue >> Apr 14, 2024  4:20 PM Santiya F wrote: Reason for CRM: Patient is calling in because she needs her insulin  refilled. Patient states the one that was sent in for her is incorrect and she needs it changed. Patient is requesting to speak with a nurse or someone in office regarding this. Please follow up with patient.

## 2024-04-14 NOTE — Telephone Encounter (Signed)
 FYI Only or Action Required?: Action required by provider: medication refill request.  Patient was last seen in primary care on 02/25/2024 by Oley Bascom RAMAN, NP. Called Nurse Triage reporting Hyperglycemia. Symptoms began several days ago. Interventions attempted: Other: used friend's insulin . Symptoms are: stable.  Triage Disposition: Call PCP Now  Patient/caregiver understands and will follow disposition?: Yes  Pt states she has been calling since last week for her insulin  to be refilled and no one has returned call. Advised pt that Humalog  was sent to [facility] on 04/10/24. Pt states it was sent to for 15 units and she is suppose to be taking higher dose since dc from hospital. Reviewed chart and I just need orders for Humalog  15 units TID. Advised pt I would send message to PCP for FU call to discuss correct dosage.   Copied from CRM [id number]. Topic: Clinical - Red Word Triage >> Apr 14, 2024 11:30 AM Winona R wrote: Pt states her sugar was 400 this morning and had to use some of a friends insulin  as she's still waiting on her insulin  to be refilled. Reason for Disposition  Blood glucose > 400 mg/dL (77.7 mmol/L)  Answer Assessment - Initial Assessment Questions 1. BLOOD GLUCOSE: What is your blood glucose level?      400 2. ONSET: When did you check the blood glucose?     This morning  5. TYPE 1 or 2:  Do you know what type of diabetes you have?  (e.g., Type 1, Type 2, Gestational; doesn't know)      DM 2  6. INSULIN : Do you take insulin ? What type of insulin (s) do you use? What is the mode of delivery? (syringe, pen; injection or pump)?      Humalog  and Lantus   Protocols used: Diabetes - High Blood Sugar-A-AH

## 2024-04-15 ENCOUNTER — Other Ambulatory Visit: Payer: Self-pay

## 2024-04-15 ENCOUNTER — Telehealth: Payer: Self-pay

## 2024-04-15 NOTE — Telephone Encounter (Signed)
 Copied from CRM [id number]. Topic: Clinical - Red Word Triage >> Apr 14, 2024  4:24 PM Delon HERO wrote: Red Word that prompted transfer to Nurse Triage: Patient is calling to report that her blood sugar is now 500. Called earlier today. With no symptoms. Awaiting a call back from office staff. Spoke to NT earlier today. Patient would like to know what should she do as her blood sugar is rising? Patient states that the office was calling her as we spoke. Patient ending call to speak to [facility]

## 2024-04-17 ENCOUNTER — Ambulatory Visit: Payer: Self-pay

## 2024-04-17 NOTE — Telephone Encounter (Signed)
 Pt was called and advised to check blood sugars two hours post eating. Pt has appt 04/23/24. KH

## 2024-04-17 NOTE — Telephone Encounter (Addendum)
 FYI Only or Action Required?: Action required by provider: update on patient condition.  Patient was last seen in primary care on 02/25/2024 by Oley Bascom RAMAN, NP. Called Nurse Triage reporting Hyperglycemia. Symptoms began today. Interventions attempted: Prescription medications: insulin  lispro (HUMALOG  KWIKPEN) - Inject 17 Units. Symptoms are: gradually worsening.  Triage Disposition: Go to ED or PCP/Alternative with Approval  Patient/caregiver understands and will follow disposition?: No, wishes to speak with PCP  **Pt. Refuses ED disposition/ CAL called, no answer**                              Copied from CRM [id number]. Topic: Clinical - Red Word Triage >> Apr 17, 2024 10:04 AM Sophia H wrote: Red Word that prompted transfer to Nurse Triage:    Patient states she is calling in to let provider know that her blood sugars are not doing too good. Running in the high 400's yesterday and this morning in the high 550's. Provider recently changed the dosage and it has not been helping.   insulin  lispro (HUMALOG  KWIKPEN) - Inject 17 Units into the skin with breakfast, with lunch, and with evening meal. - Subcutaneous Reason for Disposition  Blood glucose > 500 mg/dL (72.1 mmol/L)  Answer Assessment - Initial Assessment Questions 1. BLOOD GLUCOSE: What is your blood glucose level?      564  2. ONSET: When did you check the blood glucose?     This morning at 8:00 am   3. USUAL RANGE: What is your glucose level usually? (e.g., usual fasting morning value, usual evening value)     She says it fluctuates   4. KETONES: Do you check for ketones (urine or blood test strips)? If Yes, ask: What does the test show now?      N/A  5. TYPE 1 or 2:  Do you know what type of diabetes you have?  (e.g., Type 1, Type 2, Gestational; doesn't know)      Type 2   6. INSULIN : Do you take insulin ? What type of insulin (s) do you use? What is the mode of delivery?  (syringe, pen; injection or pump)?       insulin  lispro (HUMALOG  KWIKPEN) - Inject 17 Units, no longer effective. She was on 60 units  7. DIABETES PILLS: Do you take any pills for your diabetes? If Yes, ask: Have you missed taking any pills recently?     No   8. OTHER SYMPTOMS: Do you have any symptoms? (e.g., fever, frequent urination, difficulty breathing, dizziness, weakness, vomiting)     Frequent urination   She refuses ED disposition.  Protocols used: Diabetes - High Blood Sugar-A-AH

## 2024-04-21 ENCOUNTER — Other Ambulatory Visit (HOSPITAL_COMMUNITY): Payer: Self-pay

## 2024-04-21 ENCOUNTER — Other Ambulatory Visit: Payer: Self-pay

## 2024-04-21 ENCOUNTER — Encounter (HOSPITAL_BASED_OUTPATIENT_CLINIC_OR_DEPARTMENT_OTHER): Admitting: Internal Medicine

## 2024-04-21 MED ORDER — OXYCODONE HCL 10 MG PO TABS
10.0000 mg | ORAL_TABLET | Freq: Four times a day (QID) | ORAL | 0 refills | Status: AC | PRN
  Filled 2024-04-21: qty 120, 30d supply, fill #0

## 2024-04-21 MED ORDER — PREGABALIN 100 MG PO CAPS
100.0000 mg | ORAL_CAPSULE | Freq: Three times a day (TID) | ORAL | 0 refills | Status: DC
  Filled 2024-04-21 – 2024-04-30 (×2): qty 90, 30d supply, fill #0

## 2024-04-21 MED ORDER — DICLOFENAC SODIUM 1 % EX GEL
2.0000 g | Freq: Four times a day (QID) | CUTANEOUS | 1 refills | Status: AC | PRN
  Filled 2024-04-21: qty 300, 10d supply, fill #0

## 2024-04-23 ENCOUNTER — Telehealth (INDEPENDENT_AMBULATORY_CARE_PROVIDER_SITE_OTHER): Payer: Self-pay | Admitting: Nurse Practitioner

## 2024-04-23 ENCOUNTER — Encounter: Payer: Self-pay | Admitting: Nurse Practitioner

## 2024-04-23 VITALS — Ht 64.0 in | Wt 299.0 lb

## 2024-04-23 DIAGNOSIS — Z794 Long term (current) use of insulin: Secondary | ICD-10-CM | POA: Diagnosis not present

## 2024-04-23 DIAGNOSIS — E1165 Type 2 diabetes mellitus with hyperglycemia: Secondary | ICD-10-CM | POA: Diagnosis not present

## 2024-04-24 ENCOUNTER — Other Ambulatory Visit: Payer: Self-pay | Admitting: Cardiology

## 2024-04-24 ENCOUNTER — Other Ambulatory Visit: Payer: Self-pay

## 2024-04-24 ENCOUNTER — Telehealth: Payer: Self-pay

## 2024-04-24 DIAGNOSIS — E782 Mixed hyperlipidemia: Secondary | ICD-10-CM

## 2024-04-24 MED ORDER — DEXLANSOPRAZOLE 30 MG PO CPDR
30.0000 mg | DELAYED_RELEASE_CAPSULE | Freq: Every day | ORAL | 1 refills | Status: DC
  Filled 2024-04-24: qty 90, 90d supply, fill #0
  Filled 2024-07-25: qty 90, 90d supply, fill #1

## 2024-04-24 MED ORDER — TIZANIDINE HCL 6 MG PO CAPS
6.0000 mg | ORAL_CAPSULE | Freq: Three times a day (TID) | ORAL | 2 refills | Status: DC | PRN
  Filled 2024-04-24 – 2024-05-05 (×4): qty 90, 30d supply, fill #0

## 2024-04-24 NOTE — Telephone Encounter (Signed)
 Copied from CRM [id number]. Topic: Clinical - Prescription Issue >> Apr 24, 2024 11:17 AM Jill Shaw wrote: Reason for CRM: Pt states her provider was supposed to be upping her tizanidine  (ZANAFLEX ) 6 MG capsule yesterday due to the breaking of her arm but it is not in the system yet and she is still out of the Dexlansoprazole  30 MG capsule DR

## 2024-04-24 NOTE — Progress Notes (Signed)
 Virtual Visit via Telephone Note  I connected with Jill Shaw on 04/24/24 at 11:20 AM EDT by telephone and verified that I am speaking with the correct person using two identifiers.  Location: Patient: home Provider: office   I discussed the limitations, risks, security and privacy concerns of performing an evaluation and management service by telephone and the availability of in person appointments. I also discussed with the patient that there may be a patient responsible charge related to this service. The patient expressed understanding and agreed to proceed.   History of Present Illness:  Patient presents today for a follow-up visit through video visit.  Overall she has been doing well.  She does need some refills today.  We discussed that we will need her to come back in office at some point to get labs rechecked.  Blood sugars are fluctuating.  We will have pharmacy to follow-up for medication management.  Denies f/c/s, n/v/d, hemoptysis, PND, leg swelling Denies chest pain or edema     Observations/Objective:     04/23/2024   10:47 AM 03/30/2024   12:10 PM 03/30/2024   12:05 PM  Vitals with BMI  Height 5' 4    Weight 299 lbs    BMI 51.3    Systolic   136  Diastolic   62  Pulse  65 65     Assessment and Plan:  1. Uncontrolled type 2 diabetes mellitus with hyperglycemia, with long-term current use of insulin  (HCC) (Primary)  - AMB Referral VBCI Care Management     I discussed the assessment and treatment plan with the patient. The patient was provided an opportunity to ask questions and all were answered. The patient agreed with the plan and demonstrated an understanding of the instructions.   The patient was advised to call back or seek an in-person evaluation if the symptoms worsen or if the condition fails to improve as anticipated.  I provided 22 minutes of non-face-to-face time during this encounter.   Bascom GORMAN Borer, NP

## 2024-04-24 NOTE — Telephone Encounter (Signed)
 Pt was advised.

## 2024-04-25 ENCOUNTER — Telehealth: Payer: Self-pay | Admitting: *Deleted

## 2024-04-25 NOTE — Progress Notes (Unsigned)
 Care Guide Pharmacy Note  04/25/2024 Name: Jill Shaw MRN: [id number] DOB: Oct 27, 1975  Referred By: Oley Bascom RAMAN, NP Reason for referral: Call Attempt #1 and Complex Care Management (Outreach to schedule referral with pharmacist )   Jill Shaw is a 48 y.o. year old female who is a primary care patient of Oley Bascom RAMAN, NP.  Jill Shaw was referred to the pharmacist for assistance related to: DMII  An unsuccessful telephone outreach was attempted today to contact the patient who was referred to the pharmacy team for assistance with medication management. Additional attempts will be made to contact the patient.  Thedford Franks, CMA [place]  [facility], [facility] Guide Direct Dial : [phone]  Fax: [phone] Website: [email or web address]

## 2024-04-28 ENCOUNTER — Other Ambulatory Visit: Payer: Self-pay

## 2024-04-28 DIAGNOSIS — E1165 Type 2 diabetes mellitus with hyperglycemia: Secondary | ICD-10-CM

## 2024-04-28 NOTE — Telephone Encounter (Signed)
 Per chart is shows patient was likely given Ativan  2mg  for her MRI.   Please advise, thanks!

## 2024-04-28 NOTE — Progress Notes (Unsigned)
 04/28/2024 Name: Jill Shaw MRN: [id number] DOB: May 26, 1976  Chief Complaint  Patient presents with   Diabetes    Jill Shaw is a 48 y.o. year old female who presented for a telephone visit.   They were referred to the pharmacist by their PCP for assistance in managing diabetes. PMH includes HFpEF, HTN, COPD, IBS, gastroparesis, T2DM (hx of DKA), HLD   Subjective: Patient was last seen by PCP, Bascom Borer, NP, on 04/23/24 via video visit. Patient was recently admitted at [facility] for a seizure from 03/27/24 to 03/30/24 in the setting of seizure 2/2 hypoglycemia (BG of 34 mg/dL). This is a recurrent seizure in the setting of hypoglycemia. She did have a R humerus fracture due to the seizure. She was discharged with reduced dose of insulin  (PTA was on Toujeo  U300 60 units daily and Humalog  U200 60 units TID before meals). She was also given a 14 day supply of a CGM (Freestyle [place]) at discharge. The patient has called into [facility] recently reporting home BG in the 400-550s mg/dL. Her basal insulin  was increased from 45 to 49 units daily and her prandial insulin  was increased from 15 to 17 units with meals.   Today, patient reports doing well. She reports that she is having issues with her insulin  supply and she has self adjusted her doses. Her sugars have been running very high. She has had nausea/vomiting with high sugars over the past couple weeks, but she is not having these symptoms today. She is not wearing a Freestyle [place] sensor anymore.   Care Team: Primary Care Provider: Borer Bascom RAMAN, NP ; Next Scheduled Visit: 05/28/24 Cardiologist: Dr. Court; Next Scheduled Visit: 05/15/24 with NP Endocrinologist Dr. Mercie; Next Scheduled Visit: 08/06/24  Medication Access/Adherence  Current Pharmacy:  [facility] - [facility] [address], [address] [place] [address] Phone: [phone] Fax: [phone]  [id number] [place],  [place] - [address] AT [facility] OF [address] [place] [address] Phone: [phone] Fax: [phone]  [id number] [place], [place] - [address] AT [facility] [address] [place] [address] Phone: [phone] Fax: [phone]  [name] [address] [place] [address] Phone: [phone] Fax: [phone]  [facility] [place] - [facility] [address] [place] [address] Phone: [phone] Fax: [phone]  [facility] #[id number] [place], [place] - [address] AT [facility] OF [facility] & [address] [place] [address] Phone: [phone] Fax: [phone]   Patient reports affordability concerns with their medications: No  Patient reports access/transportation concerns to their pharmacy: No  Patient reports adherence concerns with their medications:  Yes  - she is self adjusting insulin  doses and stretching supply. Reports she only has 1/2 pen left of Humalog .  Diabetes:  Current medications: Lantus  (insulin  glargine) 49 units daily (she takes between 50-60 units once nightly), Humalog  (insulin  lispro) 17 units three times daily before meals (she is taking 60 units twice a day - but not every day to conserve insulin . If she takes two doses takes ~10AM when she checks FBG - regardless of eating, and then again 5 hours later), Jardiance  10 mg daily, metformin  XR 1000 mg daily  Medications tried in the past: Victoza (nausea/vomiting) - dx of gastroparesis  Current glucose readings: Reports her BG are regularly running in  the 400-500s- this AM FBG was 465 mg/dL (not associated with n/v right now), last night BG was 256 mg/dL - reports yesterday she did take Humalog  60 units BID, and took Lantus  60 units last night  Using Accu Chek meter; testing 2-3 times daily  Patient denies  hypoglycemic s/sx including dizziness, shakiness, sweating. Patient reports hyperglycemic symptoms including polydipsia, frequent urination, fatigue.   Current meal patterns: most days only eating 1 meal/day due to nausea vomiting.  - Breakfast: skipped yesterday - Lunch skipped yesterday - Supper: baked chicken, small serving of potatoes and broccoli (yesterday evening) - Drinks: drinking 3x 16 oz bottle of water per day, gatorade zero, body armour - one a day (~20 carbs)   Objective:  BP Readings from Last 3 Encounters:  03/30/24 136/62  02/25/24 (!) 123/53  02/14/24 125/63    Lab Results  Component Value Date   HGBA1C 9.2 (H) 03/27/2024   HGBA1C 8.9 (A) 02/25/2024   HGBA1C 9.4 (A) 11/26/2023       Latest Ref Rng & Units 03/30/2024    8:40 AM 03/29/2024    5:27 AM 03/28/2024    6:48 AM  BMP  Glucose 70 - 99 mg/dL 735  640  838   BUN 6 - 20 mg/dL 12  11  6    Creatinine 0.44 - 1.00 mg/dL 8.87  8.83  8.91   Sodium 135 - 145 mmol/L 140  141  140   Potassium 3.5 - 5.1 mmol/L 4.0  4.1  3.5   Chloride 98 - 111 mmol/L 109  109  111   CO2 22 - 32 mmol/L 25  23  21    Calcium  8.9 - 10.3 mg/dL 9.0  9.1  8.8     Lab Results  Component Value Date   CHOL 153 12/09/2021   HDL 54 12/09/2021   LDLCALC 74 12/09/2021   TRIG 144 12/09/2021   CHOLHDL 2.8 12/09/2021    Medications Reviewed Today     Reviewed by Brinda Lorain SQUIBB, RPH (Pharmacist) on 04/28/24 at 1639  Med List Status: <None>   Medication Order Taking? Sig Documenting Provider Last Dose Status Informant  albuterol  (PROVENTIL ) (2.5 MG/3ML) 0.083% nebulizer solution [id number]  Take 3 mLs (2.5 mg total) by nebulization every 6 (six) hours as needed for wheezing or shortness of breath. Parrett, Madelin RAMAN, NP  Active Self, Pharmacy Records  albuterol  (VENTOLIN  HFA) 108 (90 Base) MCG/ACT inhaler [id number]  Inhale 2 puffs into the lungs every 6 (six) hours as needed for wheezing or shortness of breath. Oley Bascom RAMAN, NP  Active  Self, Pharmacy Records  budesonide -formoterol  (SYMBICORT ) 160-4.5 MCG/ACT inhaler [id number]  Inhale 2 puffs into the lungs 2 (two) times daily. Oley Bascom RAMAN, NP  Active Self, Pharmacy Records           Med Note JACKOLYN WADDELL VEAR [place] Mar 27, 2024  7:47 AM)    busPIRone  (BUSPAR ) 10 MG tablet [id number]  Take 1 tablet (10 mg total) by mouth 2 (two) times daily. Oley Bascom RAMAN, NP  Active Self, Pharmacy Records  Continuous Glucose Sensor (FREESTYLE LIBRE 3 PLUS SENSOR) [place] [id number]  Change sensor every 15 days. Oley Bascom RAMAN, NP  Active   Dexlansoprazole  30 MG capsule DR [id number]  Take 1 capsule (30 mg total) by mouth daily. NEEDS OFFICE VISIT FOR ADDITIONAL REFILLS Oley Bascom RAMAN, NP  Active   diclofenac  Sodium (VOLTAREN ) 1 % GEL [id number]  Apply 2 to 4 gram to painful sites up  to 4 times daily if needed, max daily dose: 32 Gram   Active Self, Pharmacy Records  diclofenac  Sodium (VOLTAREN ) 1 % GEL [id number]  apply 2 to 4 gram to painful sites up to 4 times daily if needed, max daily dose: 32 Gram   Active   dicyclomine  (BENTYL ) 20 MG tablet [id number]  Take 1 tablet (20 mg total) by mouth 2 (two) times daily. Oley Bascom RAMAN, NP  Active Self, Pharmacy Records  empagliflozin  (JARDIANCE ) 10 MG TABS tablet [id number] Yes Take 1 tablet (10 mg total) by mouth daily. Oley Bascom RAMAN, NP  Active Self, Pharmacy Records  ferrous sulfate  325 (65 FE) MG tablet [id number]  Take 1 tablet (325 mg total) by mouth daily with breakfast. Franchot Novel, MD  Active   furosemide  (LASIX ) 40 MG tablet [id number]  Take 1 tablet (40 mg total) by mouth daily. Oley Bascom RAMAN, NP  Active Self, Pharmacy Records  hydrALAZINE  (APRESOLINE ) 25 MG tablet [id number]  Take 1 tablet (25 mg total) by mouth 3 (three) times daily. Oley Bascom RAMAN, NP  Active Self, Pharmacy Records  insulin  glargine (LANTUS ) 100 UNIT/ML Solostar Pen [id number] Yes Inject 49 Units into the skin at bedtime. [name], [name], FNP  Active    insulin  lispro (HUMALOG  KWIKPEN) 100 UNIT/ML KwikPen [id number] Yes Inject 17 Units into the skin with breakfast, with lunch, and with evening meal. [name], [name], FNP  Active   Insulin  Pen Needle 32G X 4 MM MISC [id number]  Use as directed in the morning, at noon, in the evening, and at bedtime. Franchot Novel, MD  Active   levocetirizine (XYZAL) 5 MG tablet [id number]  Take 5 mg by mouth every evening. [provider]  Active Self, Pharmacy Records  Melatonin 10 MG TABS [id number]  Take 10 mg by mouth at bedtime as needed (insomnia). Patsy Lenis, MD  Active Self, Pharmacy Records  metFORMIN  (GLUCOPHAGE -XR) 500 MG 24 hr tablet [id number] Yes Take 1 tablet (500 mg total) by mouth 2 (two) times daily with a meal. [name], [name], FNP  Active   metoCLOPramide  (REGLAN ) 10 MG tablet [id number]  Take 1 tablet (10 mg total) by mouth every 8 (eight) hours as needed for nausea. Rancour, Garnette, MD  Active Self, Pharmacy Records  metoprolol  succinate (TOPROL -XL) 100 MG 24 hr tablet [id number]  Take 1 tablet (100 mg total) by mouth daily. Take with or immediately following a meal. Oley Bascom RAMAN, NP  Active Self, Pharmacy Records  naloxone  (NARCAN ) nasal spray 4 mg/0.1 mL [id number]  Insert 1 spray into one nostril if poorly responding / turning blue. CALL 911 ASAP   Active Self, Pharmacy Records  nitroGLYCERIN  (NITROSTAT ) 0.4 MG SL tablet [id number]  Place 1 tablet (0.4 mg total) under the tongue every 5 (five) minutes as needed for chest pain. Additional refills to be filled by PCP, patient aware Oley Bascom RAMAN, NP  Active Self, Pharmacy Records           Med Note JACKOLYN WADDELL VEAR [place] Mar 27, 2024  7:16 AM)    norethindrone  (AYGESTIN ) 5 MG tablet [id number]  Take 1 tablet (5 mg total) by mouth 2 (two) times daily as needed (bleeding). Ajewole, Christana, MD  Active Self, Pharmacy Records  ondansetron  (ZOFRAN -ODT) 4 MG disintegrating tablet [id number]  Take 1 tablet (4 mg total) by mouth  every 8 (eight) hours as needed for nausea or vomiting. Barrett, Warren SAILOR, PA-C  Active Self, Pharmacy Records  Oxycodone  HCl 10 MG TABS  [id number]  Take 1 tablet (10 mg total) by mouth 4 (four) times daily as needed for pain   Active   potassium chloride  SA (KLOR-CON  M) 20 MEQ tablet [id number]  Take 1 tablet (20 mEq total) by mouth daily. Oley Bascom RAMAN, NP  Active Self, Pharmacy Records  pregabalin  (LYRICA ) 100 MG capsule [id number]  Take 1 (one) capsule by mouth three times daily, Decrease to twice daily if feel off balance or overmedicated.   Active   rosuvastatin  (CRESTOR ) 20 MG tablet [id number]  Take 1 tablet (20 mg total) by mouth daily. KEEP OV. Patwardhan, Newman PARAS, MD  Active   senna (SENOKOT) 8.6 MG TABS tablet [id number]  Take 1 tablet (8.6 mg total) by mouth daily. Franchot Novel, MD  Active   spironolactone  (ALDACTONE ) 25 MG tablet [id number]  Take 1 tablet (25 mg total) by mouth daily. Oley Bascom RAMAN, NP  Active Self, Pharmacy Records  sucralfate  (CARAFATE ) 1 g tablet [id number]  Take 1 tablet (1 g total) by mouth 4 (four) times daily -  with meals and at bedtime. Zehr, Jessica D, PA-C  Active Self, Pharmacy Records  tizanidine  (ZANAFLEX ) 6 MG capsule [id number]  Take 1 capsule (6 mg total) by mouth 3 (three) times daily as needed for muscle spasms. Oley Bascom RAMAN, NP  Active               Assessment/Plan:    Diabetes: - Currently uncontrolled with most recent A1C of 9.2% above goal <8% given recurrent hypoglycemia with seizures. Medication adherence appears suboptimal as patient has been trying to self-adjust insulin  after significant dose decreases at recent hospital discharge. Patient is tolerating metformin  well and is agreeable to increase today (last eGFR stable at 59 mL/min). If eGFR drops < 45 mL/min, would recommend max daily metformin  dose of 1000 mg. Patient has self increased Lantus  to 60 units nightly without s/sx of hypoglycemia - appropriate to continue. Patient  has self increased Humalog  to 60 units BID (regardless of whether she eats). Would prefer for patient to take 20 units TID with meals to reduce risk of hypoglycemia. Encouraged patient to restart wearing FL3+ CGM. She was also instructed to increase water intake and increase protein intake (at least 3x per day). Currently patient is at high risk for DKA with elevated sugar and poor PO intake, so she was advised to hold Jardiance . She was also given ED precautions for s/sx of DKA.  - Last UACR 02/25/24: 146 mg/g - Reviewed long term cardiovascular and renal outcomes of uncontrolled blood sugar - Reviewed goal A1c, goal fasting, and goal 2 hour post prandial glucose - Reviewed hypoglycemia management plan and the rule of 15 - Reviewed dietary modifications including  utilizing the healthy plate method, limiting portion size of carbohydrate foods, increasing intake of protein and non-starchy vegetables. Counseled patient to stay hydrated with water throughout the day. - Reviewed lifestyle modifications including: aiming for 150 minutes of moderate intensity exercise every week.  - Recommend to HOLD Jardiance  until risk of DKA resolves - Recommend to INCREASE metformin  XR to 1000 mg BID. Check BMP at next PCP appointment. If eGFR < 45 mL/min, reduce metformin  to max daily dose of 1000 mg.  - Recommend to INCREASE Lantus  to 60 units once daily at bedtime - Recommend to INCREASE Humalog  to 20 units TID WITH MEALS - Recommend to double water intake from 48 oz to 96 oz daily and stop drinking body armour which was 20g carb per serving - Recommend to  eat a small meal that contains protein three time daily, regardless of blood sugar.  - Recommend to check BG continuously with FL3+ CGM. Patient reports she will be able to pick up CGM tomorrow. Will plan to connect to LibreView at follow-up.  - Next A1C due Sept 2025     Written patient instructions provided. Patient verbalized understanding of treatment plan.    Follow Up Plan:  Pharmacist telephone 05/08/24 PCP clinic visit on 05/28/24   Lorain Baseman, PharmD [facility] [phone]

## 2024-04-29 ENCOUNTER — Other Ambulatory Visit: Payer: Self-pay

## 2024-04-29 MED ORDER — METFORMIN HCL ER 500 MG PO TB24
1000.0000 mg | ORAL_TABLET | Freq: Two times a day (BID) | ORAL | 1 refills | Status: AC
  Filled 2024-04-29 – 2024-06-20 (×3): qty 360, 90d supply, fill #0
  Filled 2024-10-16: qty 360, 90d supply, fill #1

## 2024-04-29 MED ORDER — INSULIN LISPRO (1 UNIT DIAL) 100 UNIT/ML (KWIKPEN)
20.0000 [IU] | PEN_INJECTOR | Freq: Three times a day (TID) | SUBCUTANEOUS | 5 refills | Status: DC
  Filled 2024-04-29: qty 9, 15d supply, fill #0
  Filled 2024-04-29: qty 27, 30d supply, fill #0
  Filled 2024-04-29: qty 9, 15d supply, fill #0
  Filled 2024-05-23: qty 27, 30d supply, fill #1
  Filled 2024-06-10: qty 27, 30d supply, fill #2
  Filled ????-??-??: fill #2

## 2024-04-29 MED ORDER — INSULIN GLARGINE 100 UNIT/ML SOLOSTAR PEN
60.0000 [IU] | PEN_INJECTOR | Freq: Every day | SUBCUTANEOUS | 5 refills | Status: DC
Start: 2024-04-29 — End: 2024-06-12
  Filled 2024-04-29: qty 18, 30d supply, fill #0
  Filled 2024-05-23: qty 18, 30d supply, fill #1
  Filled 2024-06-10 – 2024-06-13 (×2): qty 18, 30d supply, fill #2
  Filled ????-??-??: fill #1

## 2024-04-29 MED ORDER — INSULIN GLARGINE 100 UNIT/ML SOLOSTAR PEN
60.0000 [IU] | PEN_INJECTOR | Freq: Every day | SUBCUTANEOUS | 5 refills | Status: DC
  Filled 2024-04-29: qty 6, 10d supply, fill #0

## 2024-04-29 NOTE — Progress Notes (Signed)
 Patient appointment with PharmD 7/14 and follow up on 7/24  Jill Shaw  [facility], [facility] Guide  Direct Dial : [phone]  Fax [phone]

## 2024-04-29 NOTE — Addendum Note (Signed)
 Addended by: BRINDA LORAIN SQUIBB on: 04/29/2024 12:51 PM   Modules accepted: Orders

## 2024-04-30 ENCOUNTER — Other Ambulatory Visit: Payer: Self-pay

## 2024-04-30 ENCOUNTER — Other Ambulatory Visit (HOSPITAL_COMMUNITY): Payer: Self-pay

## 2024-04-30 ENCOUNTER — Encounter (HOSPITAL_COMMUNITY): Payer: Self-pay

## 2024-05-01 ENCOUNTER — Other Ambulatory Visit: Payer: Self-pay

## 2024-05-05 ENCOUNTER — Other Ambulatory Visit: Payer: Self-pay

## 2024-05-05 ENCOUNTER — Other Ambulatory Visit (HOSPITAL_COMMUNITY): Payer: Self-pay

## 2024-05-06 ENCOUNTER — Other Ambulatory Visit: Payer: Self-pay

## 2024-05-06 ENCOUNTER — Other Ambulatory Visit (HOSPITAL_COMMUNITY): Payer: Self-pay

## 2024-05-07 ENCOUNTER — Other Ambulatory Visit: Payer: Self-pay

## 2024-05-08 ENCOUNTER — Other Ambulatory Visit: Payer: Self-pay

## 2024-05-08 ENCOUNTER — Other Ambulatory Visit (HOSPITAL_COMMUNITY): Payer: Self-pay

## 2024-05-08 ENCOUNTER — Telehealth: Payer: Self-pay | Admitting: Gastroenterology

## 2024-05-08 DIAGNOSIS — E119 Type 2 diabetes mellitus without complications: Secondary | ICD-10-CM

## 2024-05-08 NOTE — Progress Notes (Signed)
 05/08/2024 Name: Jill Shaw MRN: [id number] DOB: 1976/01/11  Chief Complaint  Patient presents with   Diabetes    Jill Shaw is a 48 y.o. year old female who presented for a telephone visit.   They were referred to the pharmacist by their PCP for assistance in managing diabetes. PMH includes HFpEF, HTN, COPD, IBS, gastroparesis, T2DM (hx of DKA), HLD   Subjective: Patient was last seen by PCP, Jill Borer, NP, on 04/23/24 via video visit. Patient was recently admitted at [facility] for a seizure from 03/27/24 to 03/30/24 in the setting of seizure 2/2 hypoglycemia (BG of 34 mg/dL). This is a recurrent seizure in the setting of hypoglycemia. She did have a R humerus fracture due to the seizure. She was discharged with reduced dose of insulin  (PTA was on Toujeo  U300 60 units daily and Humalog  U200 60 units TID before meals). She was also given a 14 day supply of a CGM (Freestyle Portia) at discharge. The patient has called into [facility] recently reporting home BG in the 400-550s mg/dL. Her basal insulin  was increased from 45 to 49 units daily and her prandial insulin  was increased from 15 to 17 units with meals. She was last engaged by pharmacy on 04/28/24 and reported her BG had been running very high. She was not eating very much, had recently had s/sx of n/v, and appeared to be at high risk for DKA. She was given ED precautions and advised to hold Jardiance  to reduce risk of DKA, increase metformin , take Lantus  60 units daily, and increase Humalog  to 20 units TID with meals. She was also strongly encouraged to pick up the FL3+ CGM.  Today, patient reports doing ok. She made the changes we discussed at our last appointment, except she self increased the Humalog  to 30 units TID with meals, because 20 units TID was not doing enough for her. She continues to report occasional nausea. She is very adamant about wanting to start a medication like Ozempic or Wegovy or Zepbound for weight loss. She has not started  using the FL3+ sensor yet because she needs someone to apply it.   Care Team: Primary Care Provider: Borer Jill RAMAN, NP ; Next Scheduled Visit: 05/28/24 Cardiologist: Dr. Court; Next Scheduled Visit: 05/15/24 with NP Endocrinologist Dr. Mercie; Next Scheduled Visit: 08/06/24  Medication Access/Adherence  Current Pharmacy:  [facility] - [facility] [address] [place] [address] Phone: [phone] Fax: [phone]  [id number] [place], [place] - [address] AT [facility] OF [address] [place] [address] Phone: [phone] Fax: [phone]  [id number] - [place], [place] - [address] AT [facility] [address] [place] [address] Phone: [phone] Fax: [phone]  [name] [address] [place] [address] Phone: [phone] Fax: [phone]  [facility] [place] [facility] [address] [place] [address] Phone: [phone] Fax: [phone]  [id number] [place], [place] - [address] AT [facility] [address] [place] [address] Phone: [phone] Fax: [phone]   Patient reports affordability concerns with their medications: No  Patient reports access/transportation concerns to their pharmacy: No  Patient reports adherence concerns with their medications:  Yes  - she is self adjusting insulin  doses and stretching supply. Reports she only has 1/2 pen left of  Humalog .  Diabetes:  Current medications: Lantus  (insulin  glargine) 60 units daily (she 60 units once nightly), Humalog  (insulin  lispro) 20 units three times daily before meals (she self-increased to 30 units TID), Jardiance  10 mg daily (holding), metformin  XR 1000 mg BID (confirmed increased to 2 tabs  BID)  Medications tried in the past: Victoza (nausea/vomiting) - dx of gastroparesis  Using Accu Chek meter; testing 2-3 times daily Has not been able to apply FL3+ - needs someone to help her  Current glucose readings:  Highest 465 mg/dL, Lowest (1 day - thinks she didn't eat enough) - 96 mg/dL - but otherwise running 250-300 mg/dL.  05/03/24: 3PM 150, 8PM 366 05/04/24: 9:30AM 456, 12 PM 330, 7PM 300 05/05/24: AM 226, 1PM 327, 7PM 436 05/07/24: AM 186, 11:30AM 286, 7:30PM 398 05/08/24: AM 347, PM 266   Patient denies hypoglycemic s/sx including dizziness, shakiness, sweating. Patient reports hyperglycemic symptoms including polydipsia, frequent urination, fatigue.   Current meal patterns: has been able to increase to eating 3 times daily - Breakfast: oatmeal, egg - Lunch: PB jelly sandwich, - Supper: baked pork chop and broccoli - Snacks: piece of pound cake  - Drinks: drinking 4.5x 16 oz bottle of water per day, gatorade zero  Reports that sucralfate  help  Heart Failure with preserved EF (EF 60-65%):  Current medications:  ACEi/ARB/ARNI: none SGLT2i: Jardiance  10 mg daily (holding) Beta blocker: metoprolol  succinate 100 mg daily Mineralocorticoid Receptor Antagonist: spironolactone  25 mg daily Diuretic regimen: furosemide  40 mg daily Potassium 20 mEq daily  Current home weights: does not have scale - [facility] was not able to fill her rx for BP cuff and scale. Reports she has had some increased fluid accumulation recently in her lower extremities.  Objective:  BP Readings from Last 3 Encounters:  03/30/24 136/62  02/25/24 (!) 123/53  02/14/24 125/63    Lab Results  Component Value Date   HGBA1C 9.2 (H) 03/27/2024   HGBA1C 8.9 (A) 02/25/2024   HGBA1C 9.4 (A) 11/26/2023       Latest Ref Rng & Units 03/30/2024    8:40 AM 03/29/2024    5:27 AM 03/28/2024    6:48 AM  BMP  Glucose 70 - 99 mg/dL 735  640  838   BUN 6 - 20 mg/dL 12  11  6    Creatinine 0.44 - 1.00  mg/dL 8.87  8.83  8.91   Sodium 135 - 145 mmol/L 140  141  140   Potassium 3.5 - 5.1 mmol/L 4.0  4.1  3.5   Chloride 98 - 111 mmol/L 109  109  111   CO2 22 - 32 mmol/L 25  23  21    Calcium  8.9 - 10.3 mg/dL 9.0  9.1  8.8     Lab Results  Component Value Date   CHOL 153 12/09/2021   HDL 54 12/09/2021   LDLCALC 74 12/09/2021   TRIG 144 12/09/2021   CHOLHDL 2.8 12/09/2021    Medications Reviewed Today     Reviewed by Brinda Lorain SQUIBB, RPH (Pharmacist) on 05/08/24 at 1704  Med List Status: <None>   Medication Order Taking? Sig Documenting Provider Last Dose Status Informant  albuterol  (PROVENTIL ) (2.5 MG/3ML) 0.083% nebulizer solution [id number]  Take 3 mLs (2.5 mg total) by nebulization every 6 (six) hours as needed for wheezing or shortness of breath. Parrett, Madelin RAMAN, NP  Active Self, Pharmacy Records  albuterol  (VENTOLIN  HFA) 108 [phone] Base) MCG/ACT inhaler [id number]  Inhale 2 puffs into the lungs  every 6 (six) hours as needed for wheezing or shortness of breath. Oley Jill RAMAN, NP  Active Self, Pharmacy Records  budesonide -formoterol  (SYMBICORT ) 160-4.5 MCG/ACT inhaler [id number]  Inhale 2 puffs into the lungs 2 (two) times daily. Oley Jill RAMAN, NP  Active Self, Pharmacy Records           Med Note JACKOLYN WADDELL VEAR [place] Mar 27, 2024  7:47 AM)    busPIRone  (BUSPAR ) 10 MG tablet [id number]  Take 1 tablet (10 mg total) by mouth 2 (two) times daily. Oley Jill RAMAN, NP  Active Self, Pharmacy Records  Continuous Glucose Sensor (FREESTYLE LIBRE 3 PLUS SENSOR) [place] [id number]  Change sensor every 15 days.  Patient not taking: Reported on 05/08/2024   Oley Jill RAMAN, NP  Active   Dexlansoprazole  30 MG capsule DR [id number]  Take 1 capsule (30 mg total) by mouth daily. NEEDS OFFICE VISIT FOR ADDITIONAL REFILLS Oley Jill RAMAN, NP  Active   diclofenac  Sodium (VOLTAREN ) 1 % GEL [id number]  Apply 2 to 4 gram to painful sites up to 4 times daily if needed, max daily dose: 32 Gram   Active Self,  Pharmacy Records  diclofenac  Sodium (VOLTAREN ) 1 % GEL [id number]  apply 2 to 4 gram to painful sites up to 4 times daily if needed, max daily dose: 32 Gram   Active   dicyclomine  (BENTYL ) 20 MG tablet [id number]  Take 1 tablet (20 mg total) by mouth 2 (two) times daily. Oley Jill RAMAN, NP  Active Self, Pharmacy Records  empagliflozin  (JARDIANCE ) 10 MG TABS tablet [id number]  Take 1 tablet (10 mg total) by mouth daily. Oley Jill RAMAN, NP  Active Self, Pharmacy Records  ferrous sulfate  325 (65 FE) MG tablet [id number]  Take 1 tablet (325 mg total) by mouth daily with breakfast. Franchot Novel, MD  Active   furosemide  (LASIX ) 40 MG tablet [id number]  Take 1 tablet (40 mg total) by mouth daily. Oley Jill RAMAN, NP  Active Self, Pharmacy Records  hydrALAZINE  (APRESOLINE ) 25 MG tablet [id number]  Take 1 tablet (25 mg total) by mouth 3 (three) times daily. Oley Jill RAMAN, NP  Active Self, Pharmacy Records  insulin  glargine (LANTUS ) 100 UNIT/ML Solostar Pen [id number] Yes Inject 60 Units into the skin at bedtime. Nichols, Tonya S, NP  Active   insulin  lispro (HUMALOG  KWIKPEN) 100 UNIT/ML KwikPen [id number] Yes Inject 20 Units into the skin with breakfast, with lunch, and with evening meal. May increase up to 30 units with each meal if instructed by your provider.  Patient taking differently: Inject 20 Units into the skin with breakfast, with lunch, and with evening meal. May increase up to 30 units with each meal if instructed by your provider.   [name], [name], FNP  Active   Insulin  Pen Needle 32G X 4 MM MISC [id number]  Use as directed in the morning, at noon, in the evening, and at bedtime. Franchot Novel, MD  Active   levocetirizine (XYZAL) 5 MG tablet [id number]  Take 5 mg by mouth every evening. [provider]  Active Self, Pharmacy Records  Melatonin 10 MG TABS [id number]  Take 10 mg by mouth at bedtime as needed (insomnia). Patsy Lenis, MD  Active Self, Pharmacy Records  metFORMIN   (GLUCOPHAGE -XR) 500 MG 24 hr tablet [id number] Yes Take 2 tablets (1,000 mg total) by mouth 2 (two) times daily with a meal. [name], [name], FNP  Active   metoCLOPramide  (REGLAN ) 10 MG tablet [id number]  Take 1 tablet (  10 mg total) by mouth every 8 (eight) hours as needed for nausea. Carita Senior, MD  Active Self, Pharmacy Records  metoprolol  succinate (TOPROL -XL) 100 MG 24 hr tablet [id number]  Take 1 tablet (100 mg total) by mouth daily. Take with or immediately following a meal. Oley Jill RAMAN, NP  Active Self, Pharmacy Records  naloxone  (NARCAN ) nasal spray 4 mg/0.1 mL [id number]  Insert 1 spray into one nostril if poorly responding / turning blue. CALL 911 ASAP   Active Self, Pharmacy Records  nitroGLYCERIN  (NITROSTAT ) 0.4 MG SL tablet [id number]  Place 1 tablet (0.4 mg total) under the tongue every 5 (five) minutes as needed for chest pain. Additional refills to be filled by PCP, patient aware Oley Jill RAMAN, NP  Active Self, Pharmacy Records           Med Note JACKOLYN WADDELL VEAR [place] Mar 27, 2024  7:16 AM)    norethindrone  (AYGESTIN ) 5 MG tablet [id number]  Take 1 tablet (5 mg total) by mouth 2 (two) times daily as needed (bleeding). Ajewole, Christana, MD  Active Self, Pharmacy Records  ondansetron  (ZOFRAN -ODT) 4 MG disintegrating tablet [id number]  Take 1 tablet (4 mg total) by mouth every 8 (eight) hours as needed for nausea or vomiting. Barrett, Warren SAILOR, PA-C  Active Self, Pharmacy Records  Oxycodone  HCl 10 MG TABS [id number]  Take 1 tablet (10 mg total) by mouth 4 (four) times daily as needed for pain   Active   potassium chloride  SA (KLOR-CON  M) 20 MEQ tablet [id number]  Take 1 tablet (20 mEq total) by mouth daily. Oley Jill RAMAN, NP  Active Self, Pharmacy Records  pregabalin  (LYRICA ) 100 MG capsule [id number]  Take 1 (one) capsule by mouth three times daily, Decrease to twice daily if feel off balance or overmedicated.   Active   rosuvastatin  (CRESTOR ) 20 MG tablet [id number]  Take 1  tablet (20 mg total) by mouth daily. KEEP OV. Patwardhan, Newman PARAS, MD  Active   senna (SENOKOT) 8.6 MG TABS tablet [id number]  Take 1 tablet (8.6 mg total) by mouth daily. Franchot Novel, MD  Active   spironolactone  (ALDACTONE ) 25 MG tablet [id number]  Take 1 tablet (25 mg total) by mouth daily. Oley Jill RAMAN, NP  Active Self, Pharmacy Records  sucralfate  (CARAFATE ) 1 g tablet [id number]  Take 1 tablet (1 g total) by mouth 4 (four) times daily -  with meals and at bedtime. Zehr, Jessica D, PA-C  Active Self, Pharmacy Records  tizanidine  (ZANAFLEX ) 6 MG capsule [id number]  Take 1 capsule (6 mg total) by mouth 3 (three) times daily as needed for muscle spasms. Oley Jill RAMAN, NP  Active               Assessment/Plan:    Diabetes: - Currently uncontrolled with most recent A1C of 9.2% above goal <8% given recurrent hypoglycemia with seizures. Patient continues to self adjust insulin  despite attempts to cautiously titrate. She is tolerating increased dose of metformin  well  (last eGFR stable at 59 mL/min; if eGFR drops < 45 mL/min, would recommend max daily metformin  dose of 1000 mg). Continues to have persistent hyperglycemia, which is likely worsened by dietary indiscretion. Fortunately, she has not had recurrent hypoglycemia, therefore will continue patient-reported prandial insulin  regimen. She reports she is not able to apply her CGM herself, so will coordinate with her PCP to apply at her next clinic appointment. Appropriate to resume Jardiance  given fluid accumulation and lower risk of DKA compared to last  appointment. Patient is very adamant about wanting to start a GLP-1RA, though she does not appear to be a good candidate with diagnosis of gastroparesis and hx of intolerance to Victoza. Will attempt to collaborate with GI to assess risk vs benefit.  - Last UACR 02/25/24: 146 mg/g - Reviewed long term cardiovascular and renal outcomes of uncontrolled blood sugar - Reviewed goal A1c, goal  fasting, and goal 2 hour post prandial glucose - Reviewed hypoglycemia management plan and the rule of 15 - Reviewed dietary modifications including  utilizing the healthy plate method, limiting portion size of carbohydrate foods, increasing intake of protein and non-starchy vegetables. Counseled patient to stay hydrated with water throughout the day. - Reviewed lifestyle modifications including: aiming for 150 minutes of moderate intensity exercise every week.  - Recommend to resume Jardiance  until risk of DKA resolves - Recommend to continue metformin  XR to 1000 mg BID. Check BMP at next PCP appointment. If eGFR < 45 mL/min, reduce metformin  to max daily dose of 1000 mg.  - Recommend to continue Lantus  to 60 units once daily at bedtime - Recommend to continue Humalog  to 30 units TID WITH MEALS - Recommend to drink water only and avoid sugar containing beverages - Recommend to eat a small meal that contains protein three time daily, regardless of blood sugar.  - Recommend to bring FL3+ supplies to next clinic appointment to apply - Next A1C due Sept 2025    Written patient instructions provided. Patient verbalized understanding of treatment plan.   Follow Up Plan:  PCP 05/27/24 Pharmacist telephone 06/18/24   Lorain Baseman, PharmD [facility] [phone]

## 2024-05-08 NOTE — Telephone Encounter (Signed)
 The pt has been advised that she will need to discuss with her PCP for weight loss medications.

## 2024-05-08 NOTE — Telephone Encounter (Signed)
 Patient called and stated that she was wanting to know if it was ok for her to go on a weight loss medication. Patient is requesting a call back. Please advise.

## 2024-05-14 ENCOUNTER — Encounter (HOSPITAL_BASED_OUTPATIENT_CLINIC_OR_DEPARTMENT_OTHER): Payer: Self-pay

## 2024-05-14 ENCOUNTER — Other Ambulatory Visit: Payer: Self-pay

## 2024-05-15 ENCOUNTER — Institutional Professional Consult (permissible substitution) (HOSPITAL_BASED_OUTPATIENT_CLINIC_OR_DEPARTMENT_OTHER): Admitting: Family

## 2024-05-16 ENCOUNTER — Encounter: Admitting: Physical Medicine and Rehabilitation

## 2024-05-19 ENCOUNTER — Other Ambulatory Visit: Payer: Self-pay

## 2024-05-19 ENCOUNTER — Other Ambulatory Visit (HOSPITAL_COMMUNITY): Payer: Self-pay

## 2024-05-21 ENCOUNTER — Other Ambulatory Visit (HOSPITAL_COMMUNITY): Payer: Self-pay

## 2024-05-21 ENCOUNTER — Telehealth: Payer: Self-pay

## 2024-05-21 MED ORDER — PREGABALIN 100 MG PO CAPS
100.0000 mg | ORAL_CAPSULE | Freq: Three times a day (TID) | ORAL | 0 refills | Status: DC
  Filled 2024-05-21 – 2024-06-02 (×4): qty 90, 30d supply, fill #0

## 2024-05-21 MED ORDER — DICLOFENAC SODIUM 1 % EX GEL
CUTANEOUS | 1 refills | Status: DC
  Filled 2024-05-21: qty 300, 10d supply, fill #0
  Filled 2024-06-20: qty 300, fill #0

## 2024-05-21 MED ORDER — OXYCODONE HCL 10 MG PO TABS
10.0000 mg | ORAL_TABLET | Freq: Four times a day (QID) | ORAL | 0 refills | Status: AC
Start: 2024-05-21 — End: ?
  Filled 2024-05-21: qty 120, 30d supply, fill #0

## 2024-05-21 NOTE — Telephone Encounter (Signed)
 Left message that I am returning her call.  If she continues to have questions or concerns to please give the office a call back.   Talibah Colasurdo,RN  05/21/24

## 2024-05-21 NOTE — Telephone Encounter (Signed)
 VM left on nurse line yesterday. Patient would like increase prescription from twice daily to three times daily because she had to refill this early. Did not specify which prescription.

## 2024-05-23 ENCOUNTER — Other Ambulatory Visit: Payer: Self-pay

## 2024-05-26 ENCOUNTER — Other Ambulatory Visit: Payer: Self-pay

## 2024-05-26 ENCOUNTER — Other Ambulatory Visit (HOSPITAL_COMMUNITY): Payer: Self-pay

## 2024-05-27 ENCOUNTER — Telehealth: Payer: Self-pay

## 2024-05-27 ENCOUNTER — Encounter: Payer: Self-pay | Admitting: Nurse Practitioner

## 2024-05-27 ENCOUNTER — Other Ambulatory Visit: Payer: Self-pay | Admitting: Nurse Practitioner

## 2024-05-27 ENCOUNTER — Other Ambulatory Visit (HOSPITAL_COMMUNITY): Payer: Self-pay

## 2024-05-27 ENCOUNTER — Ambulatory Visit (INDEPENDENT_AMBULATORY_CARE_PROVIDER_SITE_OTHER): Payer: Self-pay | Admitting: Nurse Practitioner

## 2024-05-27 ENCOUNTER — Other Ambulatory Visit: Payer: Self-pay

## 2024-05-27 VITALS — BP 165/74 | HR 86 | Temp 98.2°F | Wt 294.0 lb

## 2024-05-27 DIAGNOSIS — R600 Localized edema: Secondary | ICD-10-CM | POA: Diagnosis not present

## 2024-05-27 DIAGNOSIS — F419 Anxiety disorder, unspecified: Secondary | ICD-10-CM | POA: Diagnosis not present

## 2024-05-27 MED ORDER — FUROSEMIDE 20 MG PO TABS
20.0000 mg | ORAL_TABLET | Freq: Every day | ORAL | 0 refills | Status: DC
  Filled 2024-05-27: qty 4, 4d supply, fill #0

## 2024-05-27 MED ORDER — TIZANIDINE HCL 6 MG PO CAPS
6.0000 mg | ORAL_CAPSULE | Freq: Three times a day (TID) | ORAL | 2 refills | Status: DC | PRN
  Filled 2024-05-27 – 2024-06-02 (×4): qty 90, 30d supply, fill #0
  Filled 2024-06-30 (×2): qty 90, 30d supply, fill #1
  Filled 2024-07-02: qty 40, 13d supply, fill #1
  Filled 2024-07-02: qty 50, 17d supply, fill #1
  Filled 2024-07-22 – 2024-07-28 (×2): qty 90, 30d supply, fill #2
  Filled ????-??-??: fill #1

## 2024-05-27 MED ORDER — ONDANSETRON 4 MG PO TBDP
4.0000 mg | ORAL_TABLET | Freq: Three times a day (TID) | ORAL | 0 refills | Status: AC | PRN
  Filled 2024-05-27: qty 20, 7d supply, fill #0

## 2024-05-27 NOTE — Telephone Encounter (Signed)
 Consulted with patient in room after PCP appt at [facility] today. Patient brought Freestyle Libre 3 sensor for application, but we were not able to download the [employer] 3 or [place] app on her phone. She has a galaxy A11 phone, which should be compatible, but suspect she may need to update her operating system first. She does not have a reader device at home, but we could explore obtaining one for her.   She continues to take large doses of Humalog  - reports yesterday she woke up with BG > 300 mg/dL and she took 60 units of Humalog . Then she could not recall doing anything else again until she took Lantus  60 units in the evening. This morning she woke up with BG > 300, but could not recall what she ate last night. Created an insulin  dosing schedule for her and instructed her to follow it consistently for ~2 weeks until we are able to chat again.   She was noted to be fluid overloaded in clinic today. Her PCP prescribed her furosemide  60 mg x 4 days then instructed her to resume her usual 40 mg daily. Patient instructed to call clinic if this does not reduce her swelling. She reports minimal response to furosemide  40 mg daily. Anticipate she may need a higher maintenance dose.   Patient again requested to be initiated on weight loss medication. I discussed her case with Jessica Zehr, PA at [facility] who had seen Ms. Hoganson before. Patient had normal gastric emptying study in 2022, and gastroparesis dx was added to her chart in 2008. She did not tolerate Victoza in the past, and also has chronic issues with feeling nauseous. However, GI concluded that if the patient is aware of the risks of re-trialing a GLP-1RA, it would be appropriate to consider this treatment option. Patient should be thoroughly educated to report worsening nausea and vomiting, upper abdominal pain, or significant reflux. Test claim for Mounjaro was $0 per [facility]. Patient would like to start medication, but will allow her a  few more weeks to demonstrate that she can follow instructions for dosing her insulin  and reach a more stable fluid status. Will plan to follow-up with patient in ~2 weeks.   Follow-up:  PharmD telephone 2 weeks and 06/18/24 PCP needs to be scheduled  Lorain Baseman, PharmD [facility] [phone]

## 2024-05-27 NOTE — Progress Notes (Signed)
 Subjective   Patient ID: Jill Shaw, female    DOB: 25-Mar-1976, 48 y.o.   MRN: [id number]  Chief Complaint  Patient presents with   Medical Management of Chronic Issues    Pain in right leg Swelling in lower legs    Referring provider: Oley Bascom RAMAN, NP  [name] is a 48 y.o. female with Past Medical History: No date: Abnormal uterine bleeding (AUB) No date: Arthritis     Comment:  knees, hands No date: Atypical chest pain     Comment:  cardiology--- dr elmira did cardiac cath 08-30-2021               showed minimal luminal irregularity involving LAD all               other coronaries w/ normal  flow No date: Chronic diastolic (congestive) heart failure (HCC)     Comment:  cardiologist---- dr elmira;  preserved ef No date: Chronic iron  deficiency anemia No date: Chronic pain syndrome     Comment:  followed by pain management--- dr lorilee No date: CKD (chronic kidney disease), stage III (HCC) No date: Diabetic gastroparesis (HCC) No date: Diabetic peripheral neuropathy (HCC) No date: Fibromyalgia No date: GAD (generalized anxiety disorder) No date: Generalized abdominal pain No date: GERD (gastroesophageal reflux disease) 03/21/2023: History of acute renal failure     Comment:  admission in epic due to N/V/D due ot severe sepsis POA               due to UTI No date: History of chronic gastritis     Comment:  inflammatory No date: History of diabetic ketoacidosis     Comment:  multiple admission's last 3 in epic 06/ 2022;  01/ 2022;              09/ 2021 01/2016: History of seizure     Comment:  hypoglycemic seizure 12/2020: History of vertebral compression fracture     Comment:  T11 -- T12 & L1 No date: Hyperlipidemia No date: Hypertension     Comment:  followed by pcp No date: Insulin  dependent type 2 diabetes mellitus (HCC)     Comment:  uncontrolled,  followed by pcp No date: Irritable bowel syndrome with constipation No date: MDD (major  depressive disorder) No date: Moderate COPD (chronic obstructive pulmonary disease) (HCC)     Comment:  pulmology--- dr brenna No date: Moderate persistent asthma No date: Sickle cell trait (HCC) 10/2019: Vitamin D  deficiency No date: Wears glasses   HPI  Patient presents today for follow-up on diabetes.  Her most recent A1c was 9.2.  Pharmacy did consult with her during the visit today.  She has also having peripheral edema.  We will add on 20 mg of Lasix  daily to her current dosage for 3 days.  Patient is having anxiety and we will place a referral to psychiatry for her.  Denies f/c/s, n/v/d, hemoptysis, PND, leg swelling Denies chest pain or edema     Allergies  Allergen Reactions   Elavil  [Amitriptyline ] Other (See Comments)    Coma   Orudis [Ketoprofen] Nausea And Vomiting   Desyrel  [Trazodone ] Nausea And Vomiting   Tylenol  [Acetaminophen ] Nausea And Vomiting   Ambien  [Zolpidem  Tartrate] Nausea And Vomiting    She also reported headache   Aspirin  Nausea Only   Hydroxyzine  Hcl Rash   Motrin  [Ibuprofen ] Nausea And Vomiting   Naprosyn [Naproxen] Nausea And Vomiting   Neurontin [Gabapentin] Nausea And Vomiting and  Other (See Comments)    upset stomach   Sulfa Antibiotics Nausea And Vomiting   Ultram  [Tramadol ] Nausea And Vomiting and Other (See Comments)    stomach upset   Victoza [Liraglutide] Nausea And Vomiting   Zegerid [Omeprazole -Sodium Bicarbonate ] Nausea And Vomiting    Immunization History  Administered Date(s) Administered   Influenza, Seasonal, Injecte, Preservative Fre 07/30/2023   Influenza,inj,Quad PF,6+ Mos 09/03/2018, 07/03/2019, 07/04/2020, 07/01/2021   PNEUMOCOCCAL CONJUGATE-20 07/30/2023   Pneumococcal Polysaccharide-23 03/28/2015, 07/04/2020   Tdap 03/27/2016   Unspecified SARS-COV-2 Vaccination 03/25/2020, 05/05/2020    Tobacco History: Social History   Tobacco Use  Smoking Status Some Days   Current packs/day: 0.00   Average packs/day: 0.3  packs/day for 26.0 years (6.5 ttl pk-yrs)   Types: Cigarettes   Start date: 09/13/1994   Last attempt to quit: 09/13/2020   Years since quitting: 3.7  Smokeless Tobacco Never  Tobacco Comments   Pt smokes 1/2 ppd. AB. CMA 09-10-23   Ready to quit: Not Answered Counseling given: Yes Tobacco comments: Pt smokes 1/2 ppd. AB. CMA 09-10-23   Outpatient Encounter Medications as of 05/27/2024  Medication Sig   albuterol  (PROVENTIL ) (2.5 MG/3ML) 0.083% nebulizer solution Take 3 mLs (2.5 mg total) by nebulization every 6 (six) hours as needed for wheezing or shortness of breath.   albuterol  (VENTOLIN  HFA) 108 (90 Base) MCG/ACT inhaler Inhale 2 puffs into the lungs every 6 (six) hours as needed for wheezing or shortness of breath.   budesonide -formoterol  (SYMBICORT ) 160-4.5 MCG/ACT inhaler Inhale 2 puffs into the lungs 2 (two) times daily.   busPIRone  (BUSPAR ) 10 MG tablet Take 1 tablet (10 mg total) by mouth 2 (two) times daily.   Dexlansoprazole  30 MG capsule DR Take 1 capsule (30 mg total) by mouth daily. NEEDS OFFICE VISIT FOR ADDITIONAL REFILLS   diclofenac  Sodium (VOLTAREN ) 1 % GEL Apply 2 to 4 gram to painful sites up to 4 times daily if needed, max daily dose: 32 Gram   diclofenac  Sodium (VOLTAREN ) 1 % GEL apply 2 to 4 gram to painful sites up to 4 times daily if needed, max daily dose: 32 Gram   diclofenac  Sodium (VOLTAREN ) 1 % GEL Apply 2 to 4 grams to painful sites up to 4 times daily if needed, max daily dose 32 grams   dicyclomine  (BENTYL ) 20 MG tablet Take 1 tablet (20 mg total) by mouth 2 (two) times daily.   empagliflozin  (JARDIANCE ) 10 MG TABS tablet Take 1 tablet (10 mg total) by mouth daily.   ferrous sulfate  325 (65 FE) MG tablet Take 1 tablet (325 mg total) by mouth daily with breakfast.   furosemide  (LASIX ) 20 MG tablet Take 1 tablet (20 mg total) by mouth daily.   furosemide  (LASIX ) 40 MG tablet Take 1 tablet (40 mg total) by mouth daily.   hydrALAZINE  (APRESOLINE ) 25 MG  tablet Take 1 tablet (25 mg total) by mouth 3 (three) times daily.   insulin  glargine (LANTUS ) 100 UNIT/ML Solostar Pen Inject 60 Units into the skin at bedtime.   insulin  lispro (HUMALOG  KWIKPEN) 100 UNIT/ML KwikPen Inject 20 Units into the skin with breakfast, with lunch, and with evening meal. May increase up to 30 units with each meal if instructed by your provider.   Insulin  Pen Needle 32G X 4 MM MISC Use as directed in the morning, at noon, in the evening, and at bedtime.   levocetirizine (XYZAL) 5 MG tablet Take 5 mg by mouth every evening.   Melatonin 10 MG  TABS Take 10 mg by mouth at bedtime as needed (insomnia).   metFORMIN  (GLUCOPHAGE -XR) 500 MG 24 hr tablet Take 2 tablets (1,000 mg total) by mouth 2 (two) times daily with a meal.   metoCLOPramide  (REGLAN ) 10 MG tablet Take 1 tablet (10 mg total) by mouth every 8 (eight) hours as needed for nausea.   metoprolol  succinate (TOPROL -XL) 100 MG 24 hr tablet Take 1 tablet (100 mg total) by mouth daily. Take with or immediately following a meal.   naloxone  (NARCAN ) nasal spray 4 mg/0.1 mL Insert 1 spray into one nostril if poorly responding / turning blue. CALL 911 ASAP   nitroGLYCERIN  (NITROSTAT ) 0.4 MG SL tablet Place 1 tablet (0.4 mg total) under the tongue every 5 (five) minutes as needed for chest pain. Additional refills to be filled by PCP, patient aware   norethindrone  (AYGESTIN ) 5 MG tablet Take 1 tablet (5 mg total) by mouth 2 (two) times daily as needed (bleeding).   Oxycodone  HCl 10 MG TABS Take 1 tablet (10 mg total) by mouth 4 (four) times daily as needed for pain   Oxycodone  HCl 10 MG TABS Take 1 tablet (10 mg total) by mouth 4 (four) times daily if needed for pain   potassium chloride  SA (KLOR-CON  M) 20 MEQ tablet Take 1 tablet (20 mEq total) by mouth daily.   pregabalin  (LYRICA ) 100 MG capsule Take 1 capsule (100 mg total) by mouth 3 (three) times daily, decrease to twice daily if feel off balance or overmedicated   rosuvastatin   (CRESTOR ) 20 MG tablet Take 1 tablet (20 mg total) by mouth daily. KEEP OV.   senna (SENOKOT) 8.6 MG TABS tablet Take 1 tablet (8.6 mg total) by mouth daily.   spironolactone  (ALDACTONE ) 25 MG tablet Take 1 tablet (25 mg total) by mouth daily.   sucralfate  (CARAFATE ) 1 g tablet Take 1 tablet (1 g total) by mouth 4 (four) times daily -  with meals and at bedtime.   [DISCONTINUED] ondansetron  (ZOFRAN -ODT) 4 MG disintegrating tablet Take 1 tablet (4 mg total) by mouth every 8 (eight) hours as needed for nausea or vomiting.   [DISCONTINUED] tizanidine  (ZANAFLEX ) 6 MG capsule Take 1 capsule (6 mg total) by mouth 3 (three) times daily as needed for muscle spasms.   Continuous Glucose Sensor (FREESTYLE LIBRE 3 PLUS SENSOR) MISC Change sensor every 15 days. (Patient not taking: Reported on 05/08/2024)   ondansetron  (ZOFRAN -ODT) 4 MG disintegrating tablet Take 1 tablet (4 mg total) by mouth every 8 (eight) hours as needed for nausea or vomiting.   tizanidine  (ZANAFLEX ) 6 MG capsule Take 1 capsule (6 mg total) by mouth 3 (three) times daily as needed for muscle spasms.   No facility-administered encounter medications on file as of 05/27/2024.    Review of Systems  Review of Systems  Constitutional: Negative.   HENT: Negative.    Cardiovascular: Negative.   Gastrointestinal: Negative.   Allergic/Immunologic: Negative.   Neurological: Negative.   Psychiatric/Behavioral: Negative.       Objective:   BP (!) 165/74   Pulse 86   Temp 98.2 F (36.8 C) (Oral)   Wt 294 lb (133.4 kg)   SpO2 99%   BMI 50.46 kg/m   Wt Readings from Last 5 Encounters:  05/27/24 294 lb (133.4 kg)  04/23/24 299 lb (135.6 kg)  03/28/24 299 lb 2.6 oz (135.7 kg)  02/25/24 290 lb 12.8 oz (131.9 kg)  02/14/24 270 lb (122.5 kg)     Physical Exam Vitals and  nursing note reviewed.  Constitutional:      General: She is not in acute distress.    Appearance: She is well-developed.  Cardiovascular:     Rate and Rhythm:  Normal rate and regular rhythm.  Pulmonary:     Effort: Pulmonary effort is normal.     Breath sounds: Normal breath sounds.  Neurological:     Mental Status: She is alert and oriented to person, place, and time.       Assessment & Plan:   Peripheral edema -     Ambulatory referral to Vascular Surgery -     Furosemide ; Take 1 tablet (20 mg total) by mouth daily.  Dispense: 4 tablet; Refill: 0  Anxiety -     Ambulatory referral to Psychiatry  Other orders -     tiZANidine  HCl; Take 1 capsule (6 mg total) by mouth 3 (three) times daily as needed for muscle spasms.  Dispense: 90 capsule; Refill: 2 -     Ondansetron ; Take 1 tablet (4 mg total) by mouth every 8 (eight) hours as needed for nausea or vomiting.  Dispense: 20 tablet; Refill: 0     Return in about 2 weeks (around 06/10/2024), or edema.   Bascom GORMAN Borer, NP 05/27/2024

## 2024-05-27 NOTE — Patient Instructions (Signed)
 It was nice to see you today!  Start taking furosemide  (Lasix ) 60 mg daily (40 mg tablet + 20 mg tablet) for 4 days. Call us  if you do not notice a change in your weight or fluid.  Your goal blood sugar is 80-130 before eating and less than 180 after eating.  Medication Changes:  Continue all other medication the same. Follow the instructions for your insulin .  Monitor blood sugars at home and keep a log (glucometer or piece of paper) to bring with you to your next visit.  Keep up the good work with diet and exercise. Aim for a diet full of vegetables, fruit and lean meats (chicken, [place], fish). Try to limit salt intake by eating fresh or frozen vegetables (instead of canned), rinse canned vegetables prior to cooking and do not add any additional salt to meals.

## 2024-05-28 ENCOUNTER — Ambulatory Visit: Payer: Self-pay | Admitting: Nurse Practitioner

## 2024-05-28 ENCOUNTER — Other Ambulatory Visit: Payer: Self-pay

## 2024-05-28 MED ORDER — HYDRALAZINE HCL 25 MG PO TABS
25.0000 mg | ORAL_TABLET | Freq: Three times a day (TID) | ORAL | 0 refills | Status: DC
  Filled 2024-05-28 (×2): qty 90, 30d supply, fill #0

## 2024-05-29 ENCOUNTER — Other Ambulatory Visit: Payer: Self-pay

## 2024-05-30 ENCOUNTER — Other Ambulatory Visit: Payer: Self-pay

## 2024-06-02 ENCOUNTER — Telehealth: Payer: Self-pay

## 2024-06-02 ENCOUNTER — Telehealth: Payer: Self-pay | Admitting: *Deleted

## 2024-06-02 ENCOUNTER — Other Ambulatory Visit: Payer: Self-pay

## 2024-06-02 ENCOUNTER — Other Ambulatory Visit (HOSPITAL_COMMUNITY): Payer: Self-pay

## 2024-06-02 NOTE — Telephone Encounter (Signed)
 Jill Shaw left a voice message this am stating she wants her doctor to increase her medicine to 3 pills a day for her birth control and send to [facility] on [address].  Rock Skip PEAK

## 2024-06-02 NOTE — Telephone Encounter (Signed)
 Pharmacy Patient Advocate Encounter   Received notification from Physician's Office that prior authorization for FREESTYLE LIBRE PLUS 3 SENSOR is required/requested.   Insurance verification completed.   The patient is insured through [facility] MEDICAID .   Per test claim: PA required; PA submitted to above mentioned insurance via Fax Key/confirmation #/EOC [id number] 06/02/2024 Status is pending

## 2024-06-03 ENCOUNTER — Other Ambulatory Visit: Payer: Self-pay

## 2024-06-04 ENCOUNTER — Other Ambulatory Visit: Payer: Self-pay

## 2024-06-06 ENCOUNTER — Encounter: Payer: Self-pay | Admitting: Physical Medicine and Rehabilitation

## 2024-06-06 ENCOUNTER — Encounter: Payer: Self-pay | Admitting: Obstetrics and Gynecology

## 2024-06-06 ENCOUNTER — Telehealth: Payer: Self-pay

## 2024-06-06 DIAGNOSIS — N939 Abnormal uterine and vaginal bleeding, unspecified: Secondary | ICD-10-CM

## 2024-06-06 NOTE — Telephone Encounter (Signed)
 Copied from CRM #[id number]. Topic: Referral - Question >> Jun 06, 2024 11:13 AM Jill Shaw ORN wrote: Reason for CRM: Would like a referral for vein an vascular [place]; just someone closer to her. Would also like a referral someone who can treat her anxiety.

## 2024-06-09 ENCOUNTER — Telehealth: Payer: Self-pay

## 2024-06-09 ENCOUNTER — Encounter: Attending: Physical Medicine and Rehabilitation | Admitting: Physical Medicine and Rehabilitation

## 2024-06-09 ENCOUNTER — Other Ambulatory Visit: Payer: Self-pay

## 2024-06-09 DIAGNOSIS — G629 Polyneuropathy, unspecified: Secondary | ICD-10-CM

## 2024-06-09 DIAGNOSIS — E119 Type 2 diabetes mellitus without complications: Secondary | ICD-10-CM

## 2024-06-09 MED ORDER — NORETHINDRONE ACETATE 5 MG PO TABS
5.0000 mg | ORAL_TABLET | Freq: Two times a day (BID) | ORAL | 8 refills | Status: AC | PRN
  Filled 2024-06-09: qty 90, 45d supply, fill #0

## 2024-06-09 MED ORDER — MOUNJARO 2.5 MG/0.5ML ~~LOC~~ SOAJ
2.5000 mg | SUBCUTANEOUS | 1 refills | Status: DC
  Filled 2024-06-09: qty 2, 28d supply, fill #0

## 2024-06-09 NOTE — Progress Notes (Signed)
   06/09/2024  Patient ID: Jill Shaw, female   DOB: Sep 18, 1976, 48 y.o.   MRN: [id number]  Patient called in stating that she was still having issues downloading the libre 3 app and would like to initiate starting weight loss medication Routing to clinic embedded pharmacist for follow up.  [name], PharmD Clinical Pharmacist [phone]

## 2024-06-09 NOTE — Progress Notes (Signed)
 06/09/2024 Name: Jill Shaw MRN: [id number] DOB: [birth year]  Chief Complaint  Patient presents with   Diabetes    Jill Shaw is a 48 y.o. year old female who presented for a telephone visit.   They were referred to the pharmacist by their PCP for assistance in managing diabetes. PMH includes HFpEF, HTN, COPD, IBS, gastroparesis, T2DM (hx of DKA), HLD   Subjective: Patient was recently admitted at [facility] for a seizure from 03/27/24 to 03/30/24 in the setting of seizure 2/2 hypoglycemia (BG of 34 mg/dL). This is a recurrent seizure in the setting of hypoglycemia. She did have a R humerus fracture due to the seizure. She was discharged with reduced dose of insulin  (PTA was on Toujeo  U300 60 units daily and Humalog  U200 60 units TID before meals). Patient was last seen by PCP, Bascom Borer, NP, on 05/27/24. Her BP was elevated to 165/74 mmHg, HR 86 bpm. She presented with bilateral LEE. She was instructed to take Lasix  60 mg daily x3 days then resume 40 mg daily. After her PCP visit, I consulted with her concerning her diabetes management. She reported that she continued to self-adjust Humalog . She reported taking 60 units of Humalog  in the morning and 60 units of Lantus  in the evening. I provided her with an insulin  dosing schedule that instructed her to take Humalog  30 units TID with meals AND Lantus  60 units in the evening. Unfortunately, we were not able to download the [facility] app on her phone, despite her having a compatible phone model. She again requested to be initiated on a medication to help with weight loss. We discussed that once her fluid status had improved and her BG were more stable, we could discuss initiating Mounjaro .   Today, patient reports doing ok. She reports that she has call [employer] customer service and they are unsure why she is unable to download the app. She is agreeable to trying the CGM + reader. She reports that her LE swelling has improved and she is seeing her  kidney doctor on 06/11/24. She continues to self adjust her insulin . Reports she is increasing one of her Humalog  doses to ~50 units about 3-4 times per week when her glucometer says HI and cannot read her sugar. Otherwise, she states she is following the schedule we created. She has not had any recurrent hypoglycemia. She reports her baseline nausea has been stable and that she has good days and bad days.   Care Team: Primary Care Provider: Borer Bascom RAMAN, NP ; Next Scheduled Visit: Needs to be scheduled Cardiologist: Dr. Court; Next Scheduled Visit: 05/15/24 with NP (cancelled) Endocrinologist Dr. Mercie; Next Scheduled Visit: 08/06/24  Medication Access/Adherence  Current Pharmacy:  [facility] - [facility] [address], [address] [place] [address] Phone: [phone] Fax: [phone]  [id number] [place], [place] - [address] AT [facility] OF [address] [place] [address] Phone: [phone] Fax: [phone]  [id number] [place], [place] - [address] AT [facility] [address] [place] [address] Phone: [phone] Fax: [phone]  [name] [address] [place] [address] Phone: [phone] Fax: [phone]  [facility] [address] [place] [address] Phone: [phone] Fax: [phone]  [facility] [address], [place] - [address] AT [facility] OF [facility] &  [address] [place] [address] Phone: [phone] Fax: [phone]   Patient reports affordability concerns with their medications: No  Patient reports access/transportation concerns to their pharmacy: No  Patient reports adherence concerns with their medications:  Yes  - she is self adjusting insulin  doses and  stretching supply. Reports she only has 1/2 pen left of Humalog .  Diabetes:  Current medications: Lantus  (insulin  glargine) 60 units daily (takes in the evening), Humalog  (insulin  lispro) 30 units three times daily before meals (reports that she still occasionally self increases to 50 units - reports she does this 3-4x per week - when the glucometer says HI), Jardiance  10 mg daily, metformin  XR 1000 mg BID  Medications tried in the past: Victoza (nausea/vomiting) - dx of gastroparesis  Using Accu Chek meter; testing 2-3 times daily Has not been able to apply FL3+ - needs someone to help her  Current glucose readings:  06/02/24: AM 436 mg/dL, PM 724 mg/dL 1/82/74: AM 614 mg/dL 1/83/74: AM 714 mg/dL 1/84/74: AM 574 mg/dL, 10 PM 673 mg/dL 1/85/74: AM 712 mg/dL  Patient denies hypoglycemic s/sx including dizziness, shakiness, sweating. Patient reports hyperglycemic symptoms including polydipsia, frequent urination, fatigue. She also reports she is getting a rash between her legs (possibly a yeast infection?) from high sugars.  Current meal patterns: has been able to increase to eating 3 times daily - Breakfast: oatmeal, egg - Lunch: PB jelly sandwich, - Supper: baked pork chop and broccoli, corn - Snacks: piece of pound cake  - Drinks: drinking 4.5x 16 oz bottle of water per day, gatorade zero. Reports diet soda every once in a while.  Heart Failure with preserved EF (EF 60-65%):  Current medications:  ACEi/ARB/ARNI: none SGLT2i: Jardiance  10 mg daily (holding) Beta blocker: metoprolol  succinate 100 mg daily Mineralocorticoid Receptor Antagonist: spironolactone  25 mg daily Diuretic regimen: furosemide  40 mg daily Potassium 20 mEq daily  Current home weights: does not have scale - [facility] was not able to fill her rx for BP cuff and scale.   Reports that the swelling in her LE has improved since her last visit with her PCP.  Objective:  BP Readings from Last 3  Encounters:  05/27/24 (!) 165/74  03/30/24 136/62  02/25/24 (!) 123/53    Lab Results  Component Value Date   HGBA1C 9.2 (H) 03/27/2024   HGBA1C 8.9 (A) 02/25/2024   HGBA1C 9.4 (A) 11/26/2023       Latest Ref Rng & Units 03/30/2024    8:40 AM 03/29/2024    5:27 AM 03/28/2024    6:48 AM  BMP  Glucose 70 - 99 mg/dL 735  640  838   BUN 6 - 20 mg/dL 12  11  6    Creatinine 0.44 - 1.00 mg/dL 8.87  8.83  8.91   Sodium 135 - 145 mmol/L 140  141  140   Potassium 3.5 - 5.1 mmol/L 4.0  4.1  3.5   Chloride 98 - 111 mmol/L 109  109  111   CO2 22 - 32 mmol/L 25  23  21    Calcium  8.9 - 10.3 mg/dL 9.0  9.1  8.8     Lab Results  Component Value Date   CHOL 153 12/09/2021   HDL 54 12/09/2021   LDLCALC 74 12/09/2021   TRIG 144 12/09/2021   CHOLHDL 2.8 12/09/2021    Medications Reviewed Today     Reviewed by Brinda Lorain SQUIBB, RPH (Pharmacist) on 06/09/24 at 1645  Med List Status: <  None>   Medication Order Taking? Sig Documenting Provider Last Dose Status Informant  albuterol  (PROVENTIL ) (2.5 MG/3ML) 0.083% nebulizer solution [id number]  Take 3 mLs (2.5 mg total) by nebulization every 6 (six) hours as needed for wheezing or shortness of breath. Parrett, Madelin RAMAN, NP  Active Self, Pharmacy Records  albuterol  (VENTOLIN  HFA) 108 (90 Base) MCG/ACT inhaler [id number]  Inhale 2 puffs into the lungs every 6 (six) hours as needed for wheezing or shortness of breath. Oley Bascom RAMAN, NP  Active Self, Pharmacy Records  budesonide -formoterol  (SYMBICORT ) 160-4.5 MCG/ACT inhaler [id number]  Inhale 2 puffs into the lungs 2 (two) times daily. Oley Bascom RAMAN, NP  Active Self, Pharmacy Records           Med Note JACKOLYN WADDELL VEAR [place] Mar 27, 2024  7:47 AM)    busPIRone  (BUSPAR ) 10 MG tablet [id number]  Take 1 tablet (10 mg total) by mouth 2 (two) times daily. Oley Bascom RAMAN, NP  Active Self, Pharmacy Records  Continuous Glucose Sensor (FREESTYLE LIBRE 3 PLUS SENSOR) [place] [id number]  Change sensor every 15  days.  Patient not taking: Reported on 05/08/2024   Oley Bascom RAMAN, NP  Active   Dexlansoprazole  30 MG capsule DR [id number] Yes Take 1 capsule (30 mg total) by mouth daily. NEEDS OFFICE VISIT FOR ADDITIONAL REFILLS Oley Bascom RAMAN, NP  Active   diclofenac  Sodium (VOLTAREN ) 1 % GEL [id number]  Apply 2 to 4 gram to painful sites up to 4 times daily if needed, max daily dose: 32 Gram   Active Self, Pharmacy Records  diclofenac  Sodium (VOLTAREN ) 1 % GEL [id number]  apply 2 to 4 gram to painful sites up to 4 times daily if needed, max daily dose: 32 Gram   Active   diclofenac  Sodium (VOLTAREN ) 1 % GEL [id number]  Apply 2 to 4 grams to painful sites up to 4 times daily if needed, max daily dose 32 grams   Active   dicyclomine  (BENTYL ) 20 MG tablet [id number]  Take 1 tablet (20 mg total) by mouth 2 (two) times daily. Oley Bascom RAMAN, NP  Active Self, Pharmacy Records  empagliflozin  (JARDIANCE ) 10 MG TABS tablet [id number] Yes Take 1 tablet (10 mg total) by mouth daily. Oley Bascom RAMAN, NP  Active Self, Pharmacy Records  ferrous sulfate  325 (65 FE) MG tablet [id number]  Take 1 tablet (325 mg total) by mouth daily with breakfast. Franchot Novel, MD  Active     Discontinued 06/09/24 1644 (Completed Course)   furosemide  (LASIX ) 40 MG tablet [id number] Yes Take 1 tablet (40 mg total) by mouth daily. Oley Bascom RAMAN, NP  Active Self, Pharmacy Records  hydrALAZINE  (APRESOLINE ) 25 MG tablet [id number] Yes Take 1 tablet (25 mg total) by mouth 3 (three) times daily. Oley Bascom RAMAN, NP  Active   insulin  glargine (LANTUS ) 100 UNIT/ML Solostar Pen [id number] Yes Inject 60 Units into the skin at bedtime. Nichols, Tonya S, NP  Active   insulin  lispro (HUMALOG  KWIKPEN) 100 UNIT/ML KwikPen [id number] Yes Inject 20 Units into the skin with breakfast, with lunch, and with evening meal. May increase up to 30 units with each meal if instructed by your provider. [name], [name], FNP  Active   Insulin  Pen Needle 32G X 4 MM  MISC [id number]  Use as directed in the morning, at noon, in the evening, and at bedtime. Franchot Novel, MD  Active   levocetirizine (XYZAL) 5 MG tablet [id number]  Take 5 mg by mouth every evening. [provider]  Active Self, Pharmacy Records  Melatonin 10 MG TABS [id number]  Take 10 mg by mouth at bedtime as needed (insomnia). Patsy Lenis, MD  Active Self, Pharmacy Records  metFORMIN  (GLUCOPHAGE -XR) 500 MG 24 hr tablet [id number] Yes Take 2 tablets (1,000 mg total) by mouth 2 (two) times daily with a meal. [name], [name], FNP  Active   metoCLOPramide  (REGLAN ) 10 MG tablet [id number]  Take 1 tablet (10 mg total) by mouth every 8 (eight) hours as needed for nausea. Rancour, Garnette, MD  Active Self, Pharmacy Records  metoprolol  succinate (TOPROL -XL) 100 MG 24 hr tablet [id number] Yes Take 1 tablet (100 mg total) by mouth daily. Take with or immediately following a meal. Oley Bascom RAMAN, NP  Active Self, Pharmacy Records  naloxone  (NARCAN ) nasal spray 4 mg/0.1 mL [id number]  Insert 1 spray into one nostril if poorly responding / turning blue. CALL 911 ASAP   Active Self, Pharmacy Records  nitroGLYCERIN  (NITROSTAT ) 0.4 MG SL tablet [id number]  Place 1 tablet (0.4 mg total) under the tongue every 5 (five) minutes as needed for chest pain. Additional refills to be filled by PCP, patient aware Oley Bascom RAMAN, NP  Active Self, Pharmacy Records           Med Note JACKOLYN WADDELL VEAR [place] Mar 27, 2024  7:16 AM)    norethindrone  (AYGESTIN ) 5 MG tablet [id number]  Take 1 tablet (5 mg total) by mouth 2 (two) times daily as needed (bleeding). Ajewole, Christana, MD  Active Self, Pharmacy Records  ondansetron  (ZOFRAN -ODT) 4 MG disintegrating tablet [id number]  Take 1 tablet (4 mg total) by mouth every 8 (eight) hours as needed for nausea or vomiting. Oley Bascom RAMAN, NP  Active   Oxycodone  HCl 10 MG TABS [id number]  Take 1 tablet (10 mg total) by mouth 4 (four) times daily as needed for pain   Active    Oxycodone  HCl 10 MG TABS [id number]  Take 1 tablet (10 mg total) by mouth 4 (four) times daily if needed for pain   Active   potassium chloride  SA (KLOR-CON  M) 20 MEQ tablet [id number]  Take 1 tablet (20 mEq total) by mouth daily. Oley Bascom RAMAN, NP  Active Self, Pharmacy Records  pregabalin  (LYRICA ) 100 MG capsule [id number] Yes Take 1 capsule (100 mg total) by mouth 3 (three) times daily, decrease to twice daily if feel off balance or overmedicated   Active   rosuvastatin  (CRESTOR ) 20 MG tablet [id number] Yes Take 1 tablet (20 mg total) by mouth daily. KEEP OV. Patwardhan, Newman PARAS, MD  Active   senna (SENOKOT) 8.6 MG TABS tablet [id number]  Take 1 tablet (8.6 mg total) by mouth daily. Franchot Novel, MD  Active   spironolactone  (ALDACTONE ) 25 MG tablet [id number] Yes Take 1 tablet (25 mg total) by mouth daily. Oley Bascom RAMAN, NP  Active Self, Pharmacy Records  sucralfate  (CARAFATE ) 1 g tablet [id number]  Take 1 tablet (1 g total) by mouth 4 (four) times daily -  with meals and at bedtime. Zehr, Jessica D, PA-C  Active Self, Pharmacy Records  tizanidine  (ZANAFLEX ) 6 MG capsule [id number]  Take 1 capsule (6 mg total) by mouth 3 (three) times daily as needed for muscle spasms. Oley Bascom RAMAN, NP  Active               Assessment/Plan:    Diabetes: - Currently uncontrolled with most recent A1C of 9.2% above goal <8% given recurrent hypoglycemia with seizures. She is  tolerating increased dose of metformin  well  (last eGFR stable at 59 mL/min; if eGFR drops < 45 mL/min, would recommend max daily metformin  dose of 1000 mg). Her BG have been consistently high with no evidence of hypoglycemia. She continues to be adamant about wanting to start a GLP-1RA. I discussed her case with [facility] Zehr, [place]) who reviewed pt's normal gastric emptying study in 2022 and concluded that if the patient is aware of the risks of trialing a GLP-1RA, it would be appropriate to consider this treatment option  given her poor glycemic control and comorbidities that are worsened by obesity. PCP was also in agreement with starting GLP-1RA. Test claim for Mounjaro  was $0 per [facility]. Patient was extensively counseled not to self-adjust her insulin  to avoid hypoglycemia. - Last UACR 02/25/24: 146 mg/g. Patient is treated with SGLT2i - need to continue to monitor closely for development of GU infections given risk factors of female sex, obesity, and poor glycemic control.  - Reviewed long term cardiovascular and renal outcomes of uncontrolled blood sugar - Reviewed goal A1c, goal fasting, and goal 2 hour post prandial glucose - Reviewed hypoglycemia management plan and the rule of 15 - Reviewed dietary modifications including  utilizing the healthy plate method, limiting portion size of carbohydrate foods, increasing intake of protein and non-starchy vegetables. Counseled patient to stay hydrated with water throughout the day. - Reviewed lifestyle modifications including: aiming for 150 minutes of moderate intensity exercise every week.  - Recommend to continue Jardiance  10 mg daily - Recommend to continue metformin  XR to 1000 mg BID. Check BMP at next PCP appointment. If eGFR < 45 mL/min, reduce metformin  to max daily dose of 1000 mg.  - Recommend to continue Lantus  to 60 units once daily at bedtime - Recommend to continue Humalog  to 30 units TID WITH MEALS - Recommend to START Mounjaro  2.5 mg once weekly. Patient was educated on storage, administration, and GI AE. - Recommend to drink water only and avoid sugar containing beverages - Recommend to eat a small meal that contains protein three time daily, regardless of blood sugar.  - Will attempt to send FL3+ supplies to DME supplier, as it appears to be cheaper than filling at the pharmacy.  - Next A1C due Sept 2025    Written patient instructions provided. Patient verbalized understanding of treatment plan.   Follow Up Plan:  PCP needs to be  scheduled Pharmacist telephone 06/25/24   Lorain Baseman, PharmD [facility] [phone]

## 2024-06-09 NOTE — Progress Notes (Signed)
 Subjective:    Patient ID: Jill Shaw, female    DOB: December 22, 1975, 48 y.o.   MRN: [id number]   HPI:    An audio/video tele-health visit is felt to be the most appropriate encounter for this patient at this time. This is a follow up tele-visit via phone. The patient is at home. MD is at office. Prior to scheduling this appointment, our staff discussed the limitations of evaluation and management by telemedicine and the availability of in-person appointments. The patient expressed understanding and agreed to proceed.    Jill Shaw is a 48 y.o. female who returns for f/u of inflammatory gastritis, chronic pain, diabetic peripheral neuropathy, and insomnia.   1) Insomnia: -She continues to experience insomnia at night. Asks about Ambien  and I discussed that this is an addictive medication and there are other safer options we can try first that can also help with her pain.  -She is currently not taking either of these medications, or Amitriptyline  or Trazodone .  -She takes Requip  for resltless legs but this does not help. -She is still sleeping very poorly -She does use screens before bed time  2) Back pain secondary to lumbar radiculitis.  -she has been having a hard time finding another pain management clinic -she says she did not mean to request an early refill today, that she said this wrong, she was trying to explain to [place] -she has received 2 warnings for marijuana in her urine sample as well as for not bringing the number of correct pills to her appointments -she asks if tizanidine  can be taken more than 4 times per day -She states her pain is located in her lower back radiating into her bilateral lower extremities and bilateral knee pain. She denies falling. She rates her pain 9. Her current exercise regime is walking.  -She was hospitalized for serotonin syndrome after use of Savella  and Cymbalta .  -Back pain has been severe -She requests a renewal of her handicap placard  as she is unable to walk 200 feet without stopping to rest.  -Her pain has been better controlled with Norco -she would like to get another XR given the chronicity of her pain.  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 -she is concerned that 30 days will end Saturday and she is not able to pick up her oxycodone  until Monday as per pharmacy  3) Diabetic peripheral neuropathy -she would like to try spinal cord stimulator and increase in lyrica  dose -she would like to try tens unit  -trying magnesium  -Qutenza  gives her 1 week of relief -has been having a hard time weaning off her medications -she is not getting enough relief from tylenol  #3 -she needs refill of her oxycodone  -she is currently very limited in her mobility and needs her son's assistance 24/7 at home, she needs a not indicating this for her son to give at work. -she asks whether Qutenza  can be administered more frequently than every 3 months, it has been helping a lot.  -she finds great benefits in her legs and found benefit in her hands as well but she kept forgetting and touching her face and this caused her face to birn -she is visiting her mother who has a pneumonia and asks whether her medications can be filled early -she asks whether her oxycodone  can be released early as she is going on vacation next week.  -  She tolerated Qutenza  well and this provided 2 weeks of good relief, benefit started right away.  -Her peripheral neuropathy is worse in her bilateral lower extremities, especially the dorsum and soles of both feet, including the toes.  -now present in her hands as well- she has been dropping objects due to her neuropathy- present on the dorsal and ventral aspects of her hands -she is interested in following with a dietician -she is interested in medicines for her diabetes -she is trying to lose weight  -needs handicap placard  4) Right  shoulder and arm pain -notes limited range of motion in her shoulder -does have pain radiating into right arm from neck and arm feels heavy at times  5) Inflammatory gastritis -has been back and forth to the hospital as this has been really hurting her. She was given pain medication in the hospital. She was given medication for emesis.  -flexeril  is making her sicker.  --patient says she has recently been diagnosed with this condition and pain has been severe. -she has her gastroenterologist recommended that she discuss with us  increasing her Percocet to better control her pain -she is currently prescribed 3 Percocet per day but feels she would benefit from up to 5 per day  6) GERD -she can have severe reflex at times -she was started on pepcid  for a short time period to help with this.   7) Morbid obesity -BMI 47.03 -weight is 274 lbs -she lose 40 lbs when she was sick since she could not tolerate food but she has gained a lot of this back -she asks about supplement she can take for weight loss  8) Migraines: -she has been having headaches and asks if she can get a scan of her head.   9) Diabetic gastroparesis: -this has been severe and she was recommended by ED physician to decrease her opioid usage, but she is nervous to do so because she is in so much pain -she asks about trying tylenol  with codeine   10) Postoperative pain: -pain has been really intense -she is not receiving any postoperative pain medications -she has been in a lot of pain with her wean  -her surgeon will not prescribed medications since she goes to pain management clinic  Pain Inventory Average Pain 9 Pain Right Now 9 My pain is sharp, burning, tingling and aching  In the last 24 hours, has pain interfered with the following? General activity 0 Relation with others 0 Enjoyment of life 5 What TIME of day is your pain at its worst? morning , daytime, evening and night Sleep (in general) Poor  Pain is  worse with: walking, bending, sitting, inactivity, standing and some activites Pain improves with: heat/ice and medication Relief from Meds: 10  Family History  Problem Relation Age of Onset   Diabetes Mother    Hypertension Mother    Colon cancer Maternal Grandfather    Migraines Paternal Grandfather    Colon cancer Maternal Aunt    Breast cancer Maternal Aunt    Esophageal cancer Neg Hx    Stomach cancer Neg Hx    Rectal cancer Neg Hx    Social History   Socioeconomic History   Marital status: Divorced    Spouse name: Not on file   Number of children: 3   Years of education: Not on file   Highest education level: Not on file  Occupational History   Occupation: unemployed  Tobacco Use   Smoking status: Some Days    Current  packs/day: 0.00    Average packs/day: 0.3 packs/day for 26.0 years (6.5 ttl pk-yrs)    Types: Cigarettes    Start date: 09/13/1994    Last attempt to quit: 09/13/2020    Years since quitting: 3.7   Smokeless tobacco: Never   Tobacco comments:    Pt smokes 1/2 ppd. AB. CMA 09-10-23  Vaping Use   Vaping status: Never Used  Substance and Sexual Activity   Alcohol use: No   Drug use: No   Sexual activity: Not Currently    Birth control/protection: I.U.D.  Other Topics Concern   Not on file  Social History Narrative   Right Handed   Lives in a one story apartment, but lives on the second floor   Drinks caffeine  once in awhile   Social Drivers of Health   Financial Resource Strain: Low Risk  (03/18/2023)   Overall Financial Resource Strain (CARDIA)    Difficulty of Paying Living Expenses: Not very hard  Food Insecurity: No Food Insecurity (03/27/2024)   Hunger Vital Sign    Worried About Running Out of Food in the Last Year: Never true    Ran Out of Food in the Last Year: Never true  Transportation Needs: No Transportation Needs (03/27/2024)   PRAPARE - Administrator, Civil Service (Medical): No    Lack of Transportation  (Non-Medical): No  Physical Activity: Inactive (03/18/2023)   Exercise Vital Sign    Days of Exercise per Week: 0 days    Minutes of Exercise per Session: 0 min  Stress: Stress Concern Present (03/18/2023)   [employer] of Occupational Health - Occupational Stress Questionnaire    Feeling of Stress : To some extent  Social Connections: Moderately Isolated (02/04/2024)   Social Connection and Isolation Panel    Frequency of Communication with Friends and Family: More than three times a week    Frequency of Social Gatherings with Friends and Family: Three times a week    Attends Religious Services: 1 to 4 times per year    Active Member of Clubs or Organizations: No    Attends Banker Meetings: Never    Marital Status: Separated   Past Surgical History:  Procedure Laterality Date   CATARACT EXTRACTION W/ INTRAOCULAR LENS IMPLANT Left    CESAREAN SECTION  2001   for twins   DILATION AND CURETTAGE OF UTERUS N/A 08/20/2019   Procedure: DILATATION AND CURETTAGE;  Surgeon: Starla Harland BROCKS, MD;  [facility];  Service: Gynecology;  Laterality: N/A;   DILATION AND CURETTAGE OF UTERUS  05/01/2023   Procedure: DILATATION AND CURETTAGE;  Surgeon: Jeralyn Crutch, MD;  Location: [facility];  Service: Gynecology;;   ENDOMETRIAL ABLATION N/A 08/20/2019   Procedure: Lela Ablation;  Surgeon: Starla Harland BROCKS, MD;  Location: [facility];  Service: Gynecology;  Laterality: N/A;   EYE SURGERY Bilateral    laser right and cataract removed left eye   HYSTEROSCOPY N/A 05/01/2023   Procedure: HYSTEROSCOPY;  Surgeon: Jeralyn Crutch, MD;  Location: [facility];  Service: Gynecology;  Laterality: N/A;   INTRAUTERINE DEVICE (IUD) INSERTION N/A 05/01/2023   Procedure: INTRAUTERINE DEVICE (IUD) INSERTION;  Surgeon: Jeralyn Crutch, MD;  Location: [facility];  Service: Gynecology;  Laterality: N/A;   LEFT HEART CATH AND CORONARY ANGIOGRAPHY N/A  08/30/2021   Procedure: LEFT HEART CATH AND CORONARY ANGIOGRAPHY;  Surgeon: Elmira Newman PARAS, MD;  [facility];  Service: Cardiovascular;  Laterality:  N/A;   RADIOLOGY WITH ANESTHESIA N/A 09/16/2019   Procedure: MRI WITH ANESTHESIA   L SPINE WITHOUT CONTRAST, T SPINE WITHOUT CONTRAST , CERVICAL WITHOUT CONTRAST;  Surgeon: Radiologist, Medication, MD;  [facility];  Service: Radiology;  Laterality: N/A;   TOOTH EXTRACTION N/A 01/14/2024   Procedure: DENTAL RESTORATION/EXTRACTIONS;  Surgeon: Sheryle Hamilton, DMD;  [facility];  Service: Oral Surgery;  Laterality: N/A;   TUBAL LIGATION     interval BTL   UPPER GI ENDOSCOPY  07/2017   Past Surgical History:  Procedure Laterality Date   CATARACT EXTRACTION W/ INTRAOCULAR LENS IMPLANT Left    CESAREAN SECTION  2001   for twins   DILATION AND CURETTAGE OF UTERUS N/A 08/20/2019   Procedure: DILATATION AND CURETTAGE;  Surgeon: Starla Harland BROCKS, MD;  [facility];  Service: Gynecology;  Laterality: N/A;   DILATION AND CURETTAGE OF UTERUS  05/01/2023   Procedure: DILATATION AND CURETTAGE;  Surgeon: Jeralyn Crutch, MD;  Location: [facility];  Service: Gynecology;;   ENDOMETRIAL ABLATION N/A 08/20/2019   Procedure: Lela Ablation;  Surgeon: Starla Harland BROCKS, MD;  [facility];  Service: Gynecology;  Laterality: N/A;   EYE SURGERY Bilateral    laser right and cataract removed left eye   HYSTEROSCOPY N/A 05/01/2023   Procedure: HYSTEROSCOPY;  Surgeon: Jeralyn Crutch, MD;  Location: [facility];  Service: Gynecology;  Laterality: N/A;   INTRAUTERINE DEVICE (IUD) INSERTION N/A 05/01/2023   Procedure: INTRAUTERINE DEVICE (IUD) INSERTION;  Surgeon: Jeralyn Crutch, MD;  Location: [facility];  Service: Gynecology;  Laterality: N/A;   LEFT HEART CATH AND CORONARY ANGIOGRAPHY N/A 08/30/2021   Procedure: LEFT HEART CATH AND CORONARY ANGIOGRAPHY;  Surgeon: Elmira Newman PARAS, MD;   [facility];  Service: Cardiovascular;  Laterality: N/A;   RADIOLOGY WITH ANESTHESIA N/A 09/16/2019   Procedure: MRI WITH ANESTHESIA   L SPINE WITHOUT CONTRAST, T SPINE WITHOUT CONTRAST , CERVICAL WITHOUT CONTRAST;  Surgeon: Radiologist, Medication, MD;  [facility];  Service: Radiology;  Laterality: N/A;   TOOTH EXTRACTION N/A 01/14/2024   Procedure: DENTAL RESTORATION/EXTRACTIONS;  Surgeon: Sheryle Hamilton, DMD;  [facility];  Service: Oral Surgery;  Laterality: N/A;   TUBAL LIGATION     interval BTL   UPPER GI ENDOSCOPY  07/2017   Past Medical History:  Diagnosis Date   Abnormal uterine bleeding (AUB)    Arthritis    knees, hands   Atypical chest pain    cardiology--- dr elmira did cardiac cath 08-30-2021 showed minimal luminal irregularity involving LAD all other coronaries w/ normal  flow   Chronic diastolic (congestive) heart failure [facility])    cardiologist---- dr elmira;  preserved ef   Chronic iron  deficiency anemia    Chronic pain syndrome    followed by pain management--- dr lorilee   CKD (chronic kidney disease), stage III (HCC)    Diabetic gastroparesis (HCC)    Diabetic peripheral neuropathy (HCC)    Fibromyalgia    GAD (generalized anxiety disorder)    Generalized abdominal pain    GERD (gastroesophageal reflux disease)    History of acute renal failure 03/21/2023   admission in epic due to N/V/D due ot severe sepsis POA due to UTI   History of chronic gastritis    inflammatory   History of diabetic ketoacidosis    multiple admission's last 3 in epic 06/ 2022;  01/ 2022;   09/ 2021   History of seizure 01/2016  hypoglycemic seizure   History of vertebral compression fracture 12/2020   T11 -- T12 & L1   Hyperlipidemia    Hypertension    followed by pcp   Insulin  dependent type 2 diabetes mellitus (HCC)    uncontrolled,  followed by pcp   Irritable bowel syndrome with constipation    MDD (major depressive disorder)    Moderate COPD  (chronic obstructive pulmonary disease) (HCC)    pulmology--- dr brenna   Moderate persistent asthma    Sickle cell trait (HCC)    Vitamin D  deficiency 10/2019   Wears glasses    There were no vitals taken for this visit.  Opioid Risk Score:   Fall Risk Score:  `1  Depression screen [facility] 2/9     02/14/2024   10:31 AM 12/21/2023   11:30 AM 07/26/2023   10:57 AM 05/18/2023    1:47 PM 04/23/2023   11:51 AM 04/03/2023    2:33 PM 03/21/2023    1:34 PM  Depression screen PHQ 2/9  Decreased Interest 0 0 0 0 0 2 1  Down, Depressed, Hopeless 0 0 0 0 0 2 1  PHQ - 2 Score 0 0 0 0 0 4 2  Altered sleeping      2   Tired, decreased energy      2   Change in appetite      2   Feeling bad or failure about yourself       0   Trouble concentrating      2   Moving slowly or fidgety/restless      0   Suicidal thoughts      0   PHQ-9 Score      12      Review of Systems  Constitutional:  Positive for diaphoresis and unexpected weight change.  Respiratory:  Positive for shortness of breath and wheezing.   Gastrointestinal:  Positive for abdominal pain, constipation and nausea.  Musculoskeletal:  Positive for arthralgias, back pain, gait problem and myalgias.  Neurological:  Positive for dizziness, weakness and numbness.  Hematological:  Bruises/bleeds easily.  Psychiatric/Behavioral:  Positive for confusion and decreased concentration. The patient is nervous/anxious.   All other systems reviewed and are negative.      Objective:  PRIOR EXAM Gen: no distress, normal appearing HEENT: oral mucosa pink and moist, NCAT Cardio: Reg rate Chest: normal effort, normal rate of breathing Abd: soft, non-distended Ext: no edema Psych: pleasant, normal affect Skin: intact no lesions on feet Neuro: Alert and oriented x3, decreased sensation of bilateral feet     Assessment & Plan:  1. Lumbar Radiculitis: Continue current medication regimen. Continue HEP as Tolerated. Provided with handicap placard. XR  ordered. Note for work provided via Clinical cytogeneticist.  -Topamax  prescribed, discussed this can help with pain, insomnia, and weight loss, discussed that it calms the nervous system similary to lyrica  and gabapentin but whereas these medications can cause weight gain, topamax  curbs appetite and usually causes weight loss. -discussed that she has been having a hard time finding another pain clinic -discussed that we are no longer able to prescribed controlled medications for her given her failure to comply with her pain contract with us   2. Fibromyalgia: Continue HEP as Tolerated. Continue current Medication regimen. Continue to Monitor.  3. Bilateral Knee Pain: Right knee is currently worst, XR ordered and will call with results.  Continue to Monitor.  4. Chronic Pain Syndrome: -provided note explaining her inability to work due to her  pain -discussed with patient when she last picked up oxycodone  and when she is due to pick up next refill, called pharmacy to confirm  5. Insomnia: -Try to go outside near sunrise -Get exercise during the day.  -Discussed good sleep hygiene: turning off all devices an hour before bedtime.  -Chamomile tea with dinner.  -Melatonin did for help. -warm bath or shower before bed. -apply lavender oil for forehead at night  6. Diabetic peripheral neuropathy -provided note for son to give to work to indicate that patient needs his assistance 24/7 due to her mobility deficits -checked with Qutenza  rep and medicaid/medicare will not cover Qutenza  more frequently than every 3 months  -refilled tizanidine   -discussed her response to Qutenza , discussed that it provides 1 week benefit, discussed spinal cord stimulator and increasing dose of Lyrica  and she is agreeable to both options  -Discussed Qutenza  as an option for neuropathic pain control. Discussed that this is a capsaicin  patch, stronger than capsaicin  cream. Discussed that it is currently approved for diabetic peripheral  neuropathy and post-herpetic neuralgia, but that it has also shown benefit in treating other forms of neuropathy. Provided patient with link to site to learn more about the patch: https://www.qutenza .com/. Discussed that the patch would be placed in office and benefits usually last 3 months. Discussed that unintended exposure to capsaicin  can cause severe irritation of eyes, mucous membranes, respiratory tract, and skin, but that Qutenza  is a local treatment and does not have the systemic side effects of other nerve medications. Discussed that there may be pain, itching, erythema, and decreased sensory function associated with the application of Qutenza . Side effects usually subside within 1 week. A cold pack of analgesic medications can help with these side effects. Blood pressure can also be increased due to pain associated with administration of the patch.   4 patches of Qutenza  was applied to the area of pain. Ice packs were applied during the procedure to ensure patient comfort. Blood pressure was monitored every 15 minutes. The patient tolerated the procedure well. Post-procedure instructions were given and follow-up has been scheduled.    -discussed positive response to last Qutenza  treatment  -switch from tylenol  #3 to tylenol  #4 TID PRN given lack of relief from former  -discussed benefit from TENS unit in office  Prescribed Zynex Nexwave  7. Right sided shoulder and arm pain -discussed that could be a component or frozen shoulder and/or cervical radiculitis -will obtain right shoulder XR and cervical spine XR and call when results are available  8. Obesity: -Educated that current weight is 274 lbs and current BMI is 47.03 -shared foods that can assist in weight loss: 1) leafy greens- high in fiber and nutrients 2) dark chocolate- improves metabolism (if prefer sweetened, best to sweeten with honey instead of sugar).  3) cruciferous vegetables- high in fiber and protein 4) full fat  yogurt: high in healthy fat, protein, calcium , and probiotics 5) apples- high in a variety of phytochemicals 6) nuts- high in fiber and protein that increase feelings of fullness 7) grapefruit: rich in nutrients, antioxidants, and fiber (not to be taken with anticoagulation) 8) beans- high in protein and fiber 9) salmon- has high quality protein and healthy fats 10) green tea- rich in polyphenols 11) eggs- rich in choline and vitamin D  12) tuna- high protein, boosts metabolism 13) avocado- decreases visceral abdominal fat 14) chicken (pasture raised): high in protein and iron  15) blueberries- reduce abdominal fat and cholesterol 16) whole grains- decreases calories retained during  digestion, speeds metabolism 17) chia seeds- curb appetite 18) chilies- increases fat metabolism  -Discussed supplements that can be used:  1) Metatrim 400mg  BID 30 minutes before breakfast and dinner  2) Sphaeranthus indicus and Garcinia mangostana (combinations of these and #1 can be found in capsicum and zychrome  3) green coffee bean extract 400mg  twice per day or Irvingia (african mango) 150 to 300mg  twice per day.    9) Gastroesophageal reflux disease  -discussed the interaction between Tizanidine  and Pepcid  and that their additive effects could cause hypotension and bradycardia. -recommended eating small meals -recommended avoiding acidic and spicy foods -recommended apple cider vinegar in a cup of water before meals.   10) Inflammatory gastritis: -recommended following up with GI  11) Migraines: -Head CT ordered  12) Diabetic gastroparesis: -wean Percocet to 5 tabs per day -add tylenol  with codeine  TID to replace this, assist with wean -discussed that this has been severe  5 minutes spent discussing her response to Qutenza , discussed that it provides 1 week benefit, discussed spinal cord stimulator and increasing dose of Lyrica  and she is agreeable to both options

## 2024-06-10 ENCOUNTER — Other Ambulatory Visit: Payer: Self-pay

## 2024-06-10 ENCOUNTER — Telehealth: Payer: Self-pay | Admitting: Physical Medicine and Rehabilitation

## 2024-06-10 MED ORDER — PREGABALIN 150 MG PO CAPS
150.0000 mg | ORAL_CAPSULE | Freq: Three times a day (TID) | ORAL | 3 refills | Status: AC
  Filled 2024-06-10: qty 90, 30d supply, fill #0
  Filled 2024-07-09: qty 90, 30d supply, fill #1
  Filled 2024-08-07: qty 90, 30d supply, fill #2
  Filled 2024-09-01 – 2024-09-04 (×2): qty 90, 30d supply, fill #3
  Filled 2024-10-02: qty 90, 30d supply, fill #4
  Filled 2024-10-30: qty 90, 30d supply, fill #5
  Filled 2024-10-31: qty 270, 90d supply, fill #5
  Filled 2024-10-31: qty 90, 30d supply, fill #5
  Filled 2024-10-31: qty 270, 90d supply, fill #5
  Filled ????-??-??: fill #4

## 2024-06-10 NOTE — Telephone Encounter (Signed)
 P called to see if you sent the referral to ortho for her feet yet

## 2024-06-10 NOTE — Addendum Note (Signed)
 Addended by: LORILEE SVEN SQUIBB on: 06/10/2024 02:09 PM   Modules accepted: Orders

## 2024-06-11 ENCOUNTER — Other Ambulatory Visit: Payer: Self-pay | Admitting: Nurse Practitioner

## 2024-06-11 ENCOUNTER — Other Ambulatory Visit: Payer: Self-pay

## 2024-06-11 DIAGNOSIS — F419 Anxiety disorder, unspecified: Secondary | ICD-10-CM

## 2024-06-11 DIAGNOSIS — R600 Localized edema: Secondary | ICD-10-CM

## 2024-06-12 ENCOUNTER — Telehealth: Payer: Self-pay

## 2024-06-12 ENCOUNTER — Other Ambulatory Visit: Payer: Self-pay

## 2024-06-12 DIAGNOSIS — E119 Type 2 diabetes mellitus without complications: Secondary | ICD-10-CM

## 2024-06-12 DIAGNOSIS — E1165 Type 2 diabetes mellitus with hyperglycemia: Secondary | ICD-10-CM

## 2024-06-12 MED ORDER — PEN NEEDLES 31G X 5 MM MISC
3 refills | Status: AC
  Filled 2024-06-12: qty 200, 50d supply, fill #0
  Filled 2024-10-16: qty 200, 50d supply, fill #1

## 2024-06-12 MED ORDER — TORSEMIDE 20 MG PO TABS
60.0000 mg | ORAL_TABLET | Freq: Every morning | ORAL | 3 refills | Status: AC
  Filled 2024-06-12: qty 270, 90d supply, fill #0
  Filled 2024-08-11 – 2024-10-16 (×2): qty 270, 90d supply, fill #1

## 2024-06-12 MED ORDER — LOSARTAN POTASSIUM 25 MG PO TABS
25.0000 mg | ORAL_TABLET | Freq: Every day | ORAL | 3 refills | Status: AC
  Filled 2024-06-12: qty 90, 90d supply, fill #0
  Filled 2024-08-19: qty 90, 90d supply, fill #1

## 2024-06-12 MED ORDER — TOUJEO MAX SOLOSTAR 300 UNIT/ML ~~LOC~~ SOPN
60.0000 [IU] | PEN_INJECTOR | Freq: Every day | SUBCUTANEOUS | 5 refills | Status: DC
  Filled 2024-06-12: qty 9, 34d supply, fill #0

## 2024-06-12 NOTE — Progress Notes (Signed)
 Patient has been communicating via MyChart describing severely elevated BG consistently > 400 mg/dL despite taking Lantus  60 units nightly and Humalog  30 units TID 15-20 min before meals. She is having symptoms including polyuria, irritability, and reports she is developing a rash between her legs. Patient was previously on Toujeo  U300 prior to hospital admission for hypoglycemia. Given persistently elevated BG and symptomatic hypoglycemia will collaborate with PCP to transition patient from Lantus  to Toujeo  U300 and prescribe a slightly longer pen needle size given concern for injection into scar tissue and poor absorption with multiple daily injections. Patient was instructed to increase insulin  glargine (Lantus ) to 70 units daily and follow-up with me via MyChart with a list of her blood sugars. When she runs out of Lantus  she should pick up Toujeo  U300. Will continue current dosing of Humalog  at this time. Patient also recently started Mounjaro , so I am hesitant to make large changes in her insulin  doses with the addition of this medication and hx of recurrent hypoglycemia.  Lorain Baseman, PharmD [facility] [phone]

## 2024-06-12 NOTE — Telephone Encounter (Signed)
I have spoken with her.

## 2024-06-13 ENCOUNTER — Other Ambulatory Visit: Payer: Self-pay

## 2024-06-13 MED ORDER — INSULIN LISPRO (1 UNIT DIAL) 100 UNIT/ML (KWIKPEN)
30.0000 [IU] | PEN_INJECTOR | Freq: Three times a day (TID) | SUBCUTANEOUS | 5 refills | Status: DC
  Filled 2024-06-13 – 2024-06-17 (×3): qty 36, 40d supply, fill #0
  Filled 2024-07-17: qty 36, 40d supply, fill #1
  Filled 2024-07-21: qty 36, 30d supply, fill #1
  Filled 2024-07-21: qty 36, 40d supply, fill #1

## 2024-06-13 NOTE — Telephone Encounter (Signed)
 I left a message on the voicemail for the patient. She was notified of the below message from Dr. Tenny;      Please let her know she can trial triamcinolone  0.25% ointment twice daily as needed to itchy rash which I have called in for her. She really needs to be seen in person before further management.    Thanks, Lauraine Tenny MD

## 2024-06-13 NOTE — Addendum Note (Signed)
 Addended by: BRINDA LORAIN SQUIBB on: 06/13/2024 11:56 AM   Modules accepted: Orders

## 2024-06-17 ENCOUNTER — Other Ambulatory Visit: Payer: Self-pay

## 2024-06-17 ENCOUNTER — Other Ambulatory Visit (HOSPITAL_COMMUNITY): Payer: Self-pay

## 2024-06-18 ENCOUNTER — Other Ambulatory Visit (HOSPITAL_COMMUNITY): Payer: Self-pay

## 2024-06-18 ENCOUNTER — Other Ambulatory Visit: Payer: Self-pay

## 2024-06-19 ENCOUNTER — Other Ambulatory Visit (HOSPITAL_COMMUNITY): Payer: Self-pay

## 2024-06-19 MED ORDER — OXYCODONE HCL 10 MG PO TABS
10.0000 mg | ORAL_TABLET | Freq: Four times a day (QID) | ORAL | 0 refills | Status: AC | PRN
  Filled 2024-06-19 – 2024-06-20 (×2): qty 12, 3d supply, fill #0

## 2024-06-20 ENCOUNTER — Other Ambulatory Visit: Payer: Self-pay

## 2024-06-20 ENCOUNTER — Other Ambulatory Visit: Payer: Self-pay | Admitting: Nurse Practitioner

## 2024-06-20 ENCOUNTER — Other Ambulatory Visit (HOSPITAL_COMMUNITY): Payer: Self-pay

## 2024-06-20 MED ORDER — EMPAGLIFLOZIN 10 MG PO TABS
10.0000 mg | ORAL_TABLET | Freq: Every day | ORAL | 0 refills | Status: DC
  Filled 2024-06-20: qty 30, 30d supply, fill #0

## 2024-06-23 ENCOUNTER — Other Ambulatory Visit: Payer: Self-pay

## 2024-06-23 ENCOUNTER — Other Ambulatory Visit (HOSPITAL_COMMUNITY): Payer: Self-pay

## 2024-06-23 DIAGNOSIS — E119 Type 2 diabetes mellitus without complications: Secondary | ICD-10-CM

## 2024-06-23 MED ORDER — ACCU-CHEK GUIDE TEST VI STRP
ORAL_STRIP | 4 refills | Status: AC
  Filled 2024-06-23: qty 300, 75d supply, fill #0
  Filled 2024-10-16: qty 300, 75d supply, fill #1

## 2024-06-23 MED ORDER — ACCU-CHEK SOFTCLIX LANCETS MISC
4 refills | Status: AC
  Filled 2024-06-23: qty 300, 75d supply, fill #0
  Filled 2024-10-16: qty 300, 75d supply, fill #1

## 2024-06-23 MED ORDER — DICLOFENAC SODIUM 1 % EX GEL: CUTANEOUS | 1 refills | Status: DC

## 2024-06-23 MED ORDER — OXYCODONE HCL 10 MG PO TABS
10.0000 mg | ORAL_TABLET | Freq: Four times a day (QID) | ORAL | 0 refills | Status: DC | PRN
  Filled 2024-06-23: qty 64, 16d supply, fill #0

## 2024-06-23 MED ORDER — ACCU-CHEK GUIDE W/DEVICE KIT
PACK | 0 refills | Status: AC
  Filled 2024-06-23: qty 1, 1d supply, fill #0

## 2024-06-23 NOTE — Progress Notes (Signed)
 Patient requested new glucometer and supplies. Orders placed per protocol.   Lorain Baseman, PharmD [facility] [phone]

## 2024-06-24 ENCOUNTER — Telehealth: Payer: Self-pay | Admitting: Nurse Practitioner

## 2024-06-25 ENCOUNTER — Other Ambulatory Visit: Payer: Self-pay

## 2024-06-25 NOTE — Telephone Encounter (Signed)
 9/9//2025 @ 1:27pm Reminder Call of appointment on 06/25/2024 @11 :Jill Shaw [facility] left vm

## 2024-06-25 NOTE — Progress Notes (Deleted)
 06/25/2024 Name: Jill Shaw MRN: [id number] DOB: 08/27/1976  No chief complaint on file.   Jill Shaw is a 48 y.o. year old female who presented for a telephone visit.   They were referred to the pharmacist by their PCP for assistance in managing diabetes. PMH includes HFpEF, HTN, COPD, IBS, gastroparesis, T2DM (hx of DKA), HLD   Subjective: Patient was recently admitted at [facility] for a seizure from 03/27/24 to 03/30/24 in the setting of seizure 2/2 hypoglycemia (BG of 34 mg/dL). This is a recurrent seizure in the setting of hypoglycemia. She did have a R humerus fracture due to the seizure. She was discharged with reduced dose of insulin  (PTA was on Toujeo  U300 60 units daily and Humalog  U200 60 units TID before meals). Patient was last seen by PCP, Bascom Borer, NP, on 05/27/24. Her BP was elevated to 165/74 mmHg, HR 86 bpm. She presented with bilateral LEE. She was instructed to take Lasix  60 mg daily x3 days then resume 40 mg daily. After her PCP visit, I consulted with her concerning her diabetes management. She reported that she continued to self-adjust Humalog . She reported taking 60 units of Humalog  in the morning and 60 units of Lantus  in the evening. I provided her with an insulin  dosing schedule that instructed her to take Humalog  30 units TID with meals AND Lantus  60 units in the evening. Unfortunately, we were not able to download the [facility] app on her phone, despite her having a compatible phone model. She again requested to be initiated on a medication to help with weight loss. We discussed that once her fluid status had improved and her BG were more stable, we could discuss initiating Mounjaro .   At last pharmacy appt via telephone on 06/09/24, she reported that she continued to self increase Humalog  when her glucometer read HI, but otherwise was following the schedule we created. She was not able to download the [place] app. She was instructed to start Mounjaro  2.5 mg daily and was  advised against self-adjusting insulin  while starting Mounjaro . However, she notified me via MyChart that her BG were consistently > 400 mg/dL despite taking Lantus  60 units nightly and Humalog  30 units TID. She was instructed to increase Lantus  to 70 units nightly and transition to Toujeo  U300 70 units nightly when she ran out of Lantus .   Today, patient reports doing ok. ***  Increase Mounjaro  at next fill Update on [place] order? Schedule f/u with Jill Shaw - Oct? F/u swelling - needs to go to cardiology appt   Care Team: Primary Care Provider: Borer Bascom RAMAN, NP ; Next Scheduled Visit: Needs to be scheduled Cardiologist: Dr. Court; Next Scheduled Visit: 05/15/24 with NP (cancelled) Endocrinologist Dr. Mercie; Next Scheduled Visit: 08/06/24  Medication Access/Adherence  Current Pharmacy:  [facility] - [facility] [address], [address] [place] [address] Phone: [phone] Fax: [phone]  [id number] [place], [place] - [address] AT [facility] OF [address] & [address] [place] [address] Phone: [phone] Fax: [phone]  [id number] - [place], [place] - [address] AT [facility] [address] [place] [address] Phone: [phone] Fax: [phone]  [name] [address] [place] [address] Phone: [phone] Fax: [phone]  [name] [address] [place] [address] Phone: [phone] Fax: [phone]  [facility] #  [id number] [place], [place] - [address] AT [facility] OF [facility] & [address] [place] [address] Phone: [phone] Fax: [phone]   Patient reports affordability concerns with their medications: No  Patient reports access/transportation concerns to their pharmacy: No   Patient reports adherence concerns with their medications:  Yes  - she is self adjusting insulin  doses and stretching supply. Reports she only has 1/2 pen left of Humalog .  Diabetes:  Current medications: Lantus  (insulin  glargine) 60 units daily (takes in the evening), Humalog  (insulin  lispro) 30 units three times daily before meals (reports that she still occasionally self increases to 50 units - reports she does this 3-4x per week - when the glucometer says HI), Jardiance  10 mg daily, metformin  XR 1000 mg BID  Medications tried in the past: Victoza (nausea/vomiting) - dx of gastroparesis  Using Accu Chek meter; testing 2-3 times daily Has not been able to apply FL3+ - needs someone to help her  Current glucose readings:  06/02/24: AM 436 mg/dL, PM 724 mg/dL 1/82/74: AM 614 mg/dL 1/83/74: AM 714 mg/dL 1/84/74: AM 574 mg/dL, 10 PM 673 mg/dL 1/85/74: AM 712 mg/dL  Patient denies hypoglycemic s/sx including dizziness, shakiness, sweating. Patient reports hyperglycemic symptoms including polydipsia, frequent urination, fatigue. She also reports she is getting a rash between her legs (possibly a yeast infection?) from high sugars.  Current meal patterns: has been able to increase to eating 3 times daily - Breakfast: oatmeal, egg - Lunch: PB jelly sandwich, - Supper: baked pork chop and broccoli, corn - Snacks: piece of pound cake  - Drinks: drinking 4.5x 16 oz bottle of water per day, gatorade zero. Reports diet soda every once in a while.  Heart Failure with preserved EF (EF 60-65%):  Current medications:  ACEi/ARB/ARNI: none SGLT2i: Jardiance  10 mg daily (holding) Beta blocker: metoprolol  succinate 100 mg daily Mineralocorticoid Receptor Antagonist: spironolactone  25 mg daily Diuretic regimen: furosemide  40 mg daily Potassium 20 mEq daily  Current home weights: does not have scale - [facility] was not able to fill her rx for BP cuff and scale.   Reports that the swelling  in her LE has improved since her last visit with her PCP.  Objective:  BP Readings from Last 3 Encounters:  05/27/24 (!) 165/74  03/30/24 136/62  02/25/24 (!) 123/53    Lab Results  Component Value Date   HGBA1C 9.2 (H) 03/27/2024   HGBA1C 8.9 (A) 02/25/2024   HGBA1C 9.4 (A) 11/26/2023       Latest Ref Rng & Units 03/30/2024    8:40 AM 03/29/2024    5:27 AM 03/28/2024    6:48 AM  BMP  Glucose 70 - 99 mg/dL 735  640  838   BUN 6 - 20 mg/dL 12  11  6    Creatinine 0.44 - 1.00 mg/dL 8.87  8.83  8.91   Sodium 135 - 145 mmol/L 140  141  140   Potassium 3.5 - 5.1 mmol/L 4.0  4.1  3.5   Chloride 98 - 111 mmol/L 109  109  111   CO2 22 - 32 mmol/L 25  23  21    Calcium  8.9 - 10.3 mg/dL 9.0  9.1  8.8     Lab Results  Component Value Date   CHOL 153 12/09/2021   HDL 54 12/09/2021   LDLCALC 74 12/09/2021   TRIG 144 12/09/2021   CHOLHDL 2.8 12/09/2021    Medications Reviewed Today   Medications were not  reviewed in this encounter       Assessment/Plan:    Diabetes: - Currently uncontrolled with most recent A1C of 9.2% above goal <8% given recurrent hypoglycemia with seizures. She is tolerating increased dose of metformin  well  (last eGFR stable at 59 mL/min; if eGFR drops < 45 mL/min, would recommend max daily metformin  dose of 1000 mg). Her BG have been consistently high with no evidence of hypoglycemia. She continues to be adamant about wanting to start a GLP-1RA. I discussed her case with [facility] Young Zehr, [place]) who reviewed pt's normal gastric emptying study in 2022 and concluded that if the patient is aware of the risks of trialing a GLP-1RA, it would be appropriate to consider this treatment option given her poor glycemic control and comorbidities that are worsened by obesity. PCP was also in agreement with starting GLP-1RA. Test claim for Mounjaro  was $0 per [facility]. Patient was extensively counseled not to self-adjust her insulin  to avoid hypoglycemia. - Last UACR  02/25/24: 146 mg/g. Patient is treated with SGLT2i - need to continue to monitor closely for development of GU infections given risk factors of female sex, obesity, and poor glycemic control.  - Reviewed long term cardiovascular and renal outcomes of uncontrolled blood sugar - Reviewed goal A1c, goal fasting, and goal 2 hour post prandial glucose - Reviewed hypoglycemia management plan and the rule of 15 - Reviewed dietary modifications including  utilizing the healthy plate method, limiting portion size of carbohydrate foods, increasing intake of protein and non-starchy vegetables. Counseled patient to stay hydrated with water throughout the day. - Reviewed lifestyle modifications including: aiming for 150 minutes of moderate intensity exercise every week.  - Recommend to continue Jardiance  10 mg daily - Recommend to continue metformin  XR to 1000 mg BID. Check BMP at next PCP appointment. If eGFR < 45 mL/min, reduce metformin  to max daily dose of 1000 mg.  - Recommend to continue Lantus  to 60 units once daily at bedtime - Recommend to continue Humalog  to 30 units TID WITH MEALS - Recommend to START Mounjaro  2.5 mg once weekly. Patient was educated on storage, administration, and GI AE. - Recommend to drink water only and avoid sugar containing beverages - Recommend to eat a small meal that contains protein three time daily, regardless of blood sugar.  - Will attempt to send FL3+ supplies to DME supplier, as it appears to be cheaper than filling at the pharmacy.  - Next A1C due Sept 2025    Written patient instructions provided. Patient verbalized understanding of treatment plan.   Follow Up Plan:  PCP needs to be scheduled Pharmacist telephone 06/25/24   Lorain Baseman, PharmD [facility] [phone]

## 2024-06-27 ENCOUNTER — Other Ambulatory Visit: Payer: Self-pay

## 2024-06-27 ENCOUNTER — Other Ambulatory Visit (HOSPITAL_COMMUNITY): Payer: Self-pay

## 2024-06-27 DIAGNOSIS — E119 Type 2 diabetes mellitus without complications: Secondary | ICD-10-CM

## 2024-06-27 MED ORDER — TOUJEO MAX SOLOSTAR 300 UNIT/ML ~~LOC~~ SOPN
80.0000 [IU] | PEN_INJECTOR | Freq: Every day | SUBCUTANEOUS | 5 refills | Status: DC
  Filled 2024-06-27 – 2024-07-09 (×2): qty 12, 45d supply, fill #0

## 2024-06-27 MED ORDER — MOUNJARO 5 MG/0.5ML ~~LOC~~ SOAJ
5.0000 mg | SUBCUTANEOUS | 3 refills | Status: DC
  Filled 2024-06-27 – 2024-06-30 (×2): qty 2, 28d supply, fill #0
  Filled 2024-07-25: qty 2, 28d supply, fill #1

## 2024-06-27 NOTE — Progress Notes (Signed)
 06/27/2024 Name: Jill Shaw MRN: [id number] DOB: Aug 20, 1976  No chief complaint on file.   Jill Shaw is a 48 y.o. year old female who presented for a telephone visit.   They were referred to the pharmacist by their PCP for assistance in managing diabetes. PMH includes HFpEF, HTN, COPD, IBS, gastroparesis, T2DM (hx of DKA), HLD   Subjective: Patient was recently admitted at [facility] for a seizure from 03/27/24 to 03/30/24 in the setting of seizure 2/2 hypoglycemia (BG of 34 mg/dL). This is a recurrent seizure in the setting of hypoglycemia. She did have a R humerus fracture due to the seizure. She was discharged with reduced dose of insulin  (PTA was on Toujeo  U300 60 units daily and Humalog  U200 60 units TID before meals). Patient was last seen by PCP, Bascom Borer, NP, on 05/27/24. Her BP was elevated to 165/74 mmHg, HR 86 bpm. She presented with bilateral LEE. She was instructed to take Lasix  60 mg daily x3 days then resume 40 mg daily. After her PCP visit, I consulted with her concerning her diabetes management. She reported that she continued to self-adjust Humalog . She reported taking 60 units of Humalog  in the morning and 60 units of Lantus  in the evening. I provided her with an insulin  dosing schedule that instructed her to take Humalog  30 units TID with meals AND Lantus  60 units in the evening. Unfortunately, we were not able to download the [facility] app on her phone, despite her having a compatible phone model. She again requested to be initiated on a medication to help with weight loss. We discussed that once her fluid status had improved and her BG were more stable, we could discuss initiating Mounjaro .   At last pharmacy appt via telephone on 06/09/24, she reported that she continued to self increase Humalog  when her glucometer read HI, but otherwise was following the schedule we created. She was not able to download the [place] app. She was instructed to start Mounjaro  2.5 mg daily and was  advised against self-adjusting insulin  while starting Mounjaro . However, she notified me via MyChart that her BG were consistently > 400 mg/dL despite taking Lantus  60 units nightly and Humalog  30 units TID. She was instructed to increase Lantus  to 70 units nightly and transition to Toujeo  U300 70 units nightly when she ran out of Lantus .   Today, patient reports doing ok. She denies missed doses of insulin . She reports she self-increased Humalog  to 40 units TID with meals. She feels she is tolerating Mounjaro  ok. She reports her appetite is suppressed, but she is still eating regularly throughout the day. We continue to have insurance issues with obtaining her Freestyle Herlene - and am currently awaiting insurance authorization at [employer] (DME supplier). Patient is following with nephrology who has adjusted her diuretic dose and started her on an ARB recently. She reports that she needs to go to [facility] to get labs done after starting these medications.    Care Team: Primary Care Provider: Borer Bascom RAMAN, NP ; Next Scheduled Visit: Needs to be scheduled Cardiologist: Dr. Court; Next Scheduled Visit: 07/24/24 with NP Endocrinologist Dr. Mercie; Next Scheduled Visit: 08/06/24  Medication Access/Adherence  Current Pharmacy:  [facility] - [facility] [address], [address] [place] [address] Phone: [phone] Fax: [phone]  [id number] - [place], [place] - [address] AT [facility] OF [address] [place] [address]  Phone: [phone] Fax: [phone]  [id number] - [place] - [address] AT [facility] [address] [place] [address] Phone: [phone] Fax: [phone]  [name] [address] [place] [address] Phone: [phone] Fax: [phone]  [facility]  - [facility] [address] [place] [address] Phone: [phone] Fax: [phone]  [id number] [place] - [address] AT [facility] OF [facility] & [place] [address] [place] [address] Phone: [phone] Fax: [phone]   Patient reports affordability concerns with their medications: No  Patient reports access/transportation concerns to their pharmacy: No  Patient reports adherence concerns with their medications:  Yes  - she is self adjusting insulin  doses and stretching supply. Reports she only has 1/2 pen left of Humalog .  Diabetes:  Current medications: Toujeo  U300 Max Solostar (insulin  glargine) 70 units daily (takes in the evening), Humalog  (insulin  lispro) 30 units three times daily before meals (reports that she is taking 40 units TID), Jardiance  10 mg daily, metformin  XR 1000 mg BID, Mounjaro  2.5 mg weekly (Tuesdays - has one pen left)  Medications tried in the past: Victoza (nausea/vomiting) - dx of gastroparesis - however GI reviewed and patient had a normal gastric emptying study in 2022  Using Accu Chek meter; testing 2-3 times daily Has not been able to apply FL3+ - we were not able to download the app on her phone, and she is not able to afford the copays at the pharmacy. Have sent orders to DME supplier and am awaiting insurance authorization.  Current glucose readings:  06/16/24: 10AM 286 mg/dL, 87EF 653 mg/dL, 4:69EF 782 mg/dL 0/7/74: 0:69JF 673 mg/dL, 1PM 612 mg/dL, 6PM 663 mg/dL 0/6/74: 89:69JF 791 mg/dL, 88:69JF 699 mg/dL, 5:69EF 572 mg/dL 0/4/74: 89JF 623 mg/dL, 87:69EF 705 mg/dL, 3:69EF, 653 mg/dL 0/87/74: 89JF 723 mg/dL, 87EF 613 mg/dL  Patient denies hypoglycemic s/sx including dizziness, shakiness, sweating. Patient reports hyperglycemic symptoms including polydipsia, frequent urination, fatigue. Reports that her rash is resolved  Current meal patterns: She is eating 2-3 times per  day - Breakfast: oatmeal, egg. Today - two strips of bacon, eggs, toast - Lunch: PB jelly sandwich. Today - [place] sandwich. - Supper: baked pork chop and broccoli, corn - Snacks: piece of pound cake  - Drinks: drinking 4.5x 16 oz bottle of water per day, gatorade zero. Reports diet soda every once in a while.  Heart Failure with preserved EF (EF 60-65%):  Current medications:  ACEi/ARB/ARNI: losartan  25 mg daily (just started by nephrology) SGLT2i: Jardiance  10 mg daily Beta blocker: metoprolol  succinate 100 mg daily Mineralocorticoid Receptor Antagonist: spironolactone  25 mg daily Diuretic regimen: torsemide  60 mg daily (switched from furosemide  by nephrology) Potassium 20 mEq daily (last filled 03/12/24 for 30ds)  Current home weights: does not have scale - [place] pharmacy was not able to fill her rx for BP cuff and scale.   Reports that the swelling in her LE has improved since her last visit with her PCP.  Objective:  BP Readings from Last 3 Encounters:  05/27/24 (!) 165/74  03/30/24 136/62  02/25/24 (!) 123/53    Lab Results  Component Value Date   HGBA1C 9.2 (H) 03/27/2024   HGBA1C 8.9 (A) 02/25/2024   HGBA1C 9.4 (A) 11/26/2023       Latest Ref Rng & Units 03/30/2024    8:40 AM 03/29/2024    5:27 AM 03/28/2024  6:48 AM  BMP  Glucose 70 - 99 mg/dL 735  640  838   BUN 6 - 20 mg/dL 12  11  6    Creatinine 0.44 - 1.00 mg/dL 8.87  8.83  8.91   Sodium 135 - 145 mmol/L 140  141  140   Potassium 3.5 - 5.1 mmol/L 4.0  4.1  3.5   Chloride 98 - 111 mmol/L 109  109  111   CO2 22 - 32 mmol/L 25  23  21    Calcium  8.9 - 10.3 mg/dL 9.0  9.1  8.8     Lab Results  Component Value Date   CHOL 153 12/09/2021   HDL 54 12/09/2021   LDLCALC 74 12/09/2021   TRIG 144 12/09/2021   CHOLHDL 2.8 12/09/2021    Medications Reviewed Today     Reviewed by Brinda Lorain SQUIBB, RPH (Pharmacist) on 06/27/24 at 1507  Med List Status: <None>   Medication Order Taking? Sig Documenting Provider  Last Dose Status Informant  Accu-Chek Softclix Lancets lancets [id number]  Use as instructed to monitor blood sugar 4 times daily Nichols, Tonya S, NP  Active   albuterol  (PROVENTIL ) (2.5 MG/3ML) 0.083% nebulizer solution [id number]  Take 3 mLs (2.5 mg total) by nebulization every 6 (six) hours as needed for wheezing or shortness of breath. Parrett, Madelin RAMAN, NP  Active Self, Pharmacy Records  albuterol  (VENTOLIN  HFA) 108 [phone] Base) MCG/ACT inhaler [id number]  Inhale 2 puffs into the lungs every 6 (six) hours as needed for wheezing or shortness of breath. Oley Bascom RAMAN, NP  Active Self, Pharmacy Records  Blood Glucose Monitoring Suppl (ACCU-CHEK GUIDE) w/Device KIT [id number]  Use as directed to monitor blood sugar 4 times daily (before each meal and before bed). Nichols, Tonya S, NP  Active   budesonide -formoterol  (SYMBICORT ) 160-4.5 MCG/ACT inhaler [id number]  Inhale 2 puffs into the lungs 2 (two) times daily. Oley Bascom RAMAN, NP  Active Self, Pharmacy Records           Med Note JACKOLYN WADDELL VEAR [place] Mar 27, 2024  7:47 AM)    busPIRone  (BUSPAR ) 10 MG tablet [id number]  Take 1 tablet (10 mg total) by mouth 2 (two) times daily. Oley Bascom RAMAN, NP  Active Self, Pharmacy Records  Continuous Glucose Sensor (FREESTYLE LIBRE 3 PLUS SENSOR) [place] [id number]  Change sensor every 15 days.  Patient not taking: Reported on 05/08/2024   Oley Bascom RAMAN, NP  Active   Dexlansoprazole  30 MG capsule DR [id number]  Take 1 capsule (30 mg total) by mouth daily. NEEDS OFFICE VISIT FOR ADDITIONAL REFILLS Oley Bascom RAMAN, NP  Active   diclofenac  Sodium (VOLTAREN ) 1 % GEL [id number]  Apply 2 to 4 gram to painful sites up to 4 times daily if needed, max daily dose: 32 Gram   Active Self, Pharmacy Records  diclofenac  Sodium (VOLTAREN ) 1 % GEL [id number]  apply 2 to 4 gram to painful sites up to 4 times daily if needed, max daily dose: 32 Gram   Active   diclofenac  Sodium (VOLTAREN ) 1 % GEL [id number]  Apply 2 to 4 grams to  painful sites up to 4 times daily if needed, max daily dose 32 grams   Active   diclofenac  Sodium (VOLTAREN ) 1 % GEL [id number]  Apply 2 to 4 gram to painful sites up to 4 times daily if needed, max daily dose: 32 Gram   Active   dicyclomine  (BENTYL ) 20 MG tablet [id number]  Take 1 tablet (20  mg total) by mouth 2 (two) times daily. Oley Bascom RAMAN, NP  Active Self, Pharmacy Records  empagliflozin  (JARDIANCE ) 10 MG TABS tablet [id number]  Take 1 tablet (10 mg total) by mouth daily. Oley Bascom RAMAN, NP  Active   ferrous sulfate  325 (65 FE) MG tablet [id number]  Take 1 tablet (325 mg total) by mouth daily with breakfast. Franchot Novel, MD  Active    Patient not taking:   Discontinued 06/27/24 1503 (Change in therapy) glucose blood (ACCU-CHEK GUIDE TEST) test strip [id number]  Use as instructed to monitor blood sugar 4 times daily Nichols, Tonya S, NP  Active   hydrALAZINE  (APRESOLINE ) 25 MG tablet [id number] Yes Take 1 tablet (25 mg total) by mouth 3 (three) times daily. Oley Bascom RAMAN, NP  Active   insulin  glargine, 2 Unit Dial , (TOUJEO  MAX SOLOSTAR) 300 UNIT/ML Solostar Pen [id number] Yes Inject 60 Units into the skin daily. May increase up to 80 units daily if instructed by your provider. Nichols, Tonya S, NP  Active   insulin  lispro (HUMALOG  KWIKPEN) 100 UNIT/ML KwikPen [id number] Yes Inject 30 Units into the skin with breakfast, with lunch, and with evening meal. May increase up to 40 units with each meal if instructed by your provider. Oley Bascom RAMAN, NP  Active   Insulin  Pen Needle (PEN NEEDLES) 31G X 5 MM MISC [id number]  Use as instructed to inject insulin  four times day Nichols, Tonya S, NP  Active   levocetirizine (XYZAL) 5 MG tablet [id number]  Take 5 mg by mouth every evening. [provider]  Active Self, Pharmacy Records  losartan  (COZAAR ) 25 MG tablet [id number] Yes 1 tab by mouth daily   Active   Melatonin 10 MG TABS [id number]  Take 10 mg by mouth at bedtime as needed (insomnia).  Patsy Lenis, MD  Active Self, Pharmacy Records  metFORMIN  (GLUCOPHAGE -XR) 500 MG 24 hr tablet [id number] Yes Take 2 tablets (1,000 mg total) by mouth 2 (two) times daily with a meal. [name], [name], FNP  Active   metoCLOPramide  (REGLAN ) 10 MG tablet [id number]  Take 1 tablet (10 mg total) by mouth every 8 (eight) hours as needed for nausea. Carita Senior, MD  Active Self, Pharmacy Records  metoprolol  succinate (TOPROL -XL) 100 MG 24 hr tablet [id number] Yes Take 1 tablet (100 mg total) by mouth daily. Take with or immediately following a meal. Oley Bascom RAMAN, NP  Active Self, Pharmacy Records  naloxone  (NARCAN ) nasal spray 4 mg/0.1 mL [id number]  Insert 1 spray into one nostril if poorly responding / turning blue. CALL 911 ASAP   Active Self, Pharmacy Records  nitroGLYCERIN  (NITROSTAT ) 0.4 MG SL tablet [id number]  Place 1 tablet (0.4 mg total) under the tongue every 5 (five) minutes as needed for chest pain. Additional refills to be filled by PCP, patient aware Oley Bascom RAMAN, NP  Active Self, Pharmacy Records           Med Note JACKOLYN WADDELL VEAR [place] Mar 27, 2024  7:16 AM)    norethindrone  (AYGESTIN ) 5 MG tablet [id number]  Take 1 tablet (5 mg total) by mouth 2 (two) times daily as needed (bleeding). Ajewole, Christana, MD  Active   ondansetron  (ZOFRAN -ODT) 4 MG disintegrating tablet [id number]  Take 1 tablet (4 mg total) by mouth every 8 (eight) hours as needed for nausea or vomiting. Oley Bascom RAMAN, NP  Active   Oxycodone  HCl 10 MG TABS [id number]  Take 1 tablet (10 mg total) by mouth 4 (four) times  daily as needed for pain   Active   Oxycodone  HCl 10 MG TABS [id number]  Take 1 tablet (10 mg total) by mouth 4 (four) times daily if needed for pain   Active   Oxycodone  HCl 10 MG TABS [id number]  Take 1 tablet (10 mg total) by mouth 4 (four) times daily as needed for pain 06/20/24   Active   Oxycodone  HCl 10 MG TABS [id number]  Take 1 tablet (10 mg total) by mouth 4 (four) times daily as needed.    Active   potassium chloride  SA (KLOR-CON  M) 20 MEQ tablet [id number]  Take 1 tablet (20 mEq total) by mouth daily. Oley Bascom RAMAN, NP  Active Self, Pharmacy Records  pregabalin  (LYRICA ) 150 MG capsule [id number]  Take 1 capsule (150 mg total) by mouth 3 (three) times daily. Lorilee Sven SQUIBB, MD  Active   rosuvastatin  (CRESTOR ) 20 MG tablet [id number]  Take 1 tablet (20 mg total) by mouth daily. KEEP OV. Patwardhan, Newman PARAS, MD  Active   senna (SENOKOT) 8.6 MG TABS tablet [id number]  Take 1 tablet (8.6 mg total) by mouth daily. Franchot Novel, MD  Active   spironolactone  (ALDACTONE ) 25 MG tablet [id number] Yes Take 1 tablet (25 mg total) by mouth daily. Oley Bascom RAMAN, NP  Active Self, Pharmacy Records  sucralfate  (CARAFATE ) 1 g tablet [id number]  Take 1 tablet (1 g total) by mouth 4 (four) times daily -  with meals and at bedtime. Zehr, Jessica D, PA-C  Active Self, Pharmacy Records  tirzepatide  (MOUNJARO ) 2.5 MG/0.5ML Pen [id number] Yes Inject 2.5 mg into the skin once a week. Oley Bascom RAMAN, NP  Active   tizanidine  (ZANAFLEX ) 6 MG capsule [id number]  Take 1 capsule (6 mg total) by mouth 3 (three) times daily as needed for muscle spasms. Oley Bascom RAMAN, NP  Active   torsemide  (DEMADEX ) 20 MG tablet [id number] Yes Take 3 tablets (60 mg total) by mouth in the morning for swelling.   Active               Assessment/Plan:    Diabetes: - Currently uncontrolled with most recent A1C of 9.2% above goal <8% given recurrent hypoglycemia with seizures. She is tolerating Mounjaro  2.5 mg weekly well. She reports that she has appetite suppression, but per dietary recall she is still eating regular portions of food throughout the day. Discussed avoiding carbohydrate heavy foods and eating small portions as we titrate her GLP-1/GIP RA. Per SMBG, patient continues to have persistent hyperglycemia despite TDD of insulin  of 190 units. Will adjust basal insulin  today and continue to cautiously titrate  Mounjaro . Patient has upcoming appt with endocrinology.  - Last UACR 02/25/24: 146 mg/g. Patient is treated with SGLT2i - need to continue to monitor closely for development of GU infections given risk factors of female sex, obesity, and poor glycemic control.  - Reviewed long term cardiovascular and renal outcomes of uncontrolled blood sugar - Reviewed goal A1c, goal fasting, and goal 2 hour post prandial glucose - Reviewed hypoglycemia management plan and the rule of 15 - Reviewed dietary modifications including  utilizing the healthy plate method, limiting portion size of carbohydrate foods, increasing intake of protein and non-starchy vegetables. Counseled patient to stay hydrated with water throughout the day. - Recommend to continue Jardiance  10 mg daily - Recommend to continue metformin  XR to 1000 mg BID. Continue to monitor eGFR to ensure it stays > 45 mL/min - Recommend to INCREASE Toujeo  U300 to 80 units once daily  in the evening. - Recommend to continue Humalog  to 40 units TID WITH MEALS - Recommend to INCREASE Mounjaro  to  5 mg once weekly at next fill - Recommend to drink water only and avoid sugar containing beverages - Recommend to eat a small meal that contains protein three time daily, regardless of blood sugar.  - Will attempt follow-up with DME supplier on status of FL3+ CGM. - Next A1C due now - expect to be worse than previous based on SMBG. - Encouraged patient to get labs done as instructed by nephrology provider given recent changes in diuretic and addition of ARB.  Written patient instructions provided. Patient verbalized understanding of treatment plan.   Follow Up Plan:  PCP needs to be scheduled Pharmacist telephone 07/11/24 (2 weeks)   Lorain Baseman, PharmD [facility] [phone]

## 2024-06-30 ENCOUNTER — Other Ambulatory Visit: Payer: Self-pay

## 2024-07-02 ENCOUNTER — Other Ambulatory Visit: Payer: Self-pay

## 2024-07-07 ENCOUNTER — Other Ambulatory Visit: Payer: Self-pay

## 2024-07-07 ENCOUNTER — Other Ambulatory Visit (HOSPITAL_COMMUNITY): Payer: Self-pay

## 2024-07-07 ENCOUNTER — Other Ambulatory Visit (HOSPITAL_BASED_OUTPATIENT_CLINIC_OR_DEPARTMENT_OTHER): Payer: Self-pay

## 2024-07-07 ENCOUNTER — Encounter (HOSPITAL_BASED_OUTPATIENT_CLINIC_OR_DEPARTMENT_OTHER): Admitting: Internal Medicine

## 2024-07-09 ENCOUNTER — Other Ambulatory Visit (HOSPITAL_COMMUNITY): Payer: Self-pay

## 2024-07-09 ENCOUNTER — Other Ambulatory Visit: Payer: Self-pay

## 2024-07-09 ENCOUNTER — Other Ambulatory Visit (HOSPITAL_BASED_OUTPATIENT_CLINIC_OR_DEPARTMENT_OTHER): Payer: Self-pay

## 2024-07-09 MED ORDER — OXYCODONE HCL 10 MG PO TABS
10.0000 mg | ORAL_TABLET | Freq: Four times a day (QID) | ORAL | 0 refills | Status: DC | PRN
  Filled 2024-07-09: qty 56, 14d supply, fill #0

## 2024-07-11 ENCOUNTER — Telehealth: Payer: Self-pay

## 2024-07-11 ENCOUNTER — Other Ambulatory Visit: Payer: Self-pay

## 2024-07-11 NOTE — Progress Notes (Signed)
 Attempted to contact patient for scheduled appointment for medication management. Left HIPAA compliant message for patient to return my call at their convenience.   Called [employer] to f/u on CGM order. Was told insurance was not contracted with them to fill order, so they sent it to sister company [employer]. Called [facility] who had the order, but did not have any accompanying documentation that was originally sent via [employer]. Will resend order to different DME supplier via [employer].   Lorain Baseman, PharmD [facility] [phone]

## 2024-07-11 NOTE — Progress Notes (Unsigned)
 07/11/2024 Name: Jill Shaw MRN: [id number] DOB: [birth year]  No chief complaint on file.   Jill Shaw is a 48 y.o. year old female who presented for a telephone visit.   They were referred to the pharmacist by their PCP for assistance in managing diabetes. PMH includes HFpEF, HTN, COPD, IBS, gastroparesis, T2DM (hx of DKA), HLD   Subjective: Patient was recently admitted at [facility] for a seizure from 03/27/24 to 03/30/24 in the setting of seizure 2/2 hypoglycemia (BG of 34 mg/dL). This is a recurrent seizure in the setting of hypoglycemia. She did have a R humerus fracture due to the seizure. She was discharged with reduced dose of insulin  (PTA was on Toujeo  U300 60 units daily and Humalog  U200 60 units TID before meals). Patient was last seen by PCP, Jill Borer, NP, on 05/27/24. Her BP was elevated to 165/74 mmHg, HR 86 bpm. She presented with bilateral LEE. She was instructed to take Lasix  60 mg daily x3 days then resume 40 mg daily. After her PCP visit, I consulted with her concerning her diabetes management. She reported that she continued to self-adjust Humalog . She reported taking 60 units of Humalog  in the morning and 60 units of Lantus  in the evening. I provided her with an insulin  dosing schedule that instructed her to take Humalog  30 units TID with meals AND Lantus  60 units in the evening. Unfortunately, we were not able to download the [facility] app on her phone, despite her having a compatible phone model. She again requested to be initiated on a medication to help with weight loss. We discussed that once her fluid status had improved and her BG were more stable, we could discuss initiating Mounjaro .   At last pharmacy appt via telephone on 06/09/24, she reported that she continued to self increase Humalog  when her glucometer read HI, but otherwise was following the schedule we created. She was not able to download the [place] app. She was instructed to start Mounjaro  2.5 mg daily and was  advised against self-adjusting insulin  while starting Mounjaro . However, she notified me via MyChart that her BG were consistently > 400 mg/dL despite taking Lantus  60 units nightly and Humalog  30 units TID. She was instructed to increase Lantus  to 70 units nightly and transition to Toujeo  U300 70 units nightly when she ran out of Lantus .   Today, patient reports doing ok. She denies missed doses of insulin . She reports she self-increased Humalog  to 40 units TID with meals. She feels she is tolerating Mounjaro  ok. She reports her appetite is suppressed, but she is still eating regularly throughout the day. We continue to have insurance issues with obtaining her Freestyle Herlene - and am currently awaiting insurance authorization at [employer] (DME supplier). Patient is following with nephrology who has adjusted her diuretic dose and started her on an ARB recently. She reports that she needs to go to [facility] to get labs done after starting these medications.    Care Team: Primary Care Provider: Borer Jill RAMAN, NP ; Next Scheduled Visit: Needs to be scheduled Cardiologist: Dr. Court; Next Scheduled Visit: 07/24/24 with NP Endocrinologist Dr. Mercie; Next Scheduled Visit: 08/06/24  Medication Access/Adherence  Current Pharmacy:  [facility] - [facility] [address], [address] [place] [address] Phone: [phone] Fax: [phone]  [id number] - [place], [place] - [address] AT [facility] OF [address] [place] [address]  Phone: [phone] Fax: [phone]  [id number] - [place], [place] - [address] AT [facility] [address] [place] [address] Phone: [phone] Fax: [phone]  [name] [address] [place] [address] Phone: [phone] Fax: [phone]  [facility]  - [facility] [address] [place] [address] Phone: [phone] Fax: [phone]  [id number] [place], [place] - [address] AT [facility] OF [facility] & [place] [address] [place] [address] Phone: [phone] Fax: [phone]   Patient reports affordability concerns with their medications: No  Patient reports access/transportation concerns to their pharmacy: No  Patient reports adherence concerns with their medications:  Yes  - she is self adjusting insulin  doses and stretching supply. Reports she only has 1/2 pen left of Humalog .  Diabetes:  Current medications: Toujeo  U300 Max Solostar (insulin  glargine) 70 units daily (takes in the evening), Humalog  (insulin  lispro) 30 units three times daily before meals (reports that she is taking 40 units TID), Jardiance  10 mg daily, metformin  XR 1000 mg BID, Mounjaro  2.5 mg weekly (Tuesdays - has one pen left)  Medications tried in the past: Victoza (nausea/vomiting) - dx of gastroparesis - however GI reviewed and patient had a normal gastric emptying study in 2022  Using Accu Chek meter; testing 2-3 times daily Has not been able to apply FL3+ - we were not able to download the app on her phone, and she is not able to afford the copays at the pharmacy. Have sent orders to DME supplier and am awaiting insurance authorization.  Current glucose readings:  06/16/24: 10AM 286 mg/dL, 87EF 653 mg/dL, 4:69EF 782 mg/dL 0/7/74: 0:69JF 673 mg/dL, 1PM 612 mg/dL, 6PM 663 mg/dL 0/6/74: 89:69JF 791 mg/dL, 88:69JF 699 mg/dL, 5:69EF 572 mg/dL 0/4/74: 89JF 623 mg/dL, 87:69EF 705 mg/dL, 3:69EF, 653 mg/dL 0/87/74: 89JF 723 mg/dL, 87EF 613 mg/dL  Patient denies hypoglycemic s/sx including dizziness, shakiness, sweating. Patient reports hyperglycemic symptoms including polydipsia, frequent urination, fatigue. Reports that her rash is resolved  Current meal patterns: She is eating 2-3 times per  day - Breakfast: oatmeal, egg. Today - two strips of bacon, eggs, toast - Lunch: PB jelly sandwich. Today - [place] sandwich. - Supper: baked pork chop and broccoli, corn - Snacks: piece of pound cake  - Drinks: drinking 4.5x 16 oz bottle of water per day, gatorade zero. Reports diet soda every once in a while.  Heart Failure with preserved EF (EF 60-65%):  Current medications:  ACEi/ARB/ARNI: losartan  25 mg daily (just started by nephrology) SGLT2i: Jardiance  10 mg daily Beta blocker: metoprolol  succinate 100 mg daily Mineralocorticoid Receptor Antagonist: spironolactone  25 mg daily Diuretic regimen: torsemide  60 mg daily (switched from furosemide  by nephrology) Potassium 20 mEq daily (last filled 03/12/24 for 30ds)  Current home weights: does not have scale - [facility] was not able to fill her rx for BP cuff and scale.   Reports that the swelling in her LE has improved since her last visit with her PCP.  Objective:  BP Readings from Last 3 Encounters:  05/27/24 (!) 165/74  03/30/24 136/62  02/25/24 (!) 123/53    Lab Results  Component Value Date   HGBA1C 9.2 (H) 03/27/2024   HGBA1C 8.9 (A) 02/25/2024   HGBA1C 9.4 (A) 11/26/2023       Latest Ref Rng & Units 03/30/2024    8:40 AM 03/29/2024    5:27 AM 03/28/2024  6:48 AM  BMP  Glucose 70 - 99 mg/dL 735  640  838   BUN 6 - 20 mg/dL 12  11  6    Creatinine 0.44 - 1.00 mg/dL 8.87  8.83  8.91   Sodium 135 - 145 mmol/L 140  141  140   Potassium 3.5 - 5.1 mmol/L 4.0  4.1  3.5   Chloride 98 - 111 mmol/L 109  109  111   CO2 22 - 32 mmol/L 25  23  21    Calcium  8.9 - 10.3 mg/dL 9.0  9.1  8.8     Lab Results  Component Value Date   CHOL 153 12/09/2021   HDL 54 12/09/2021   LDLCALC 74 12/09/2021   TRIG 144 12/09/2021   CHOLHDL 2.8 12/09/2021    Medications Reviewed Today   Medications were not reviewed in this encounter       Assessment/Plan:    Diabetes: - Currently uncontrolled with most recent A1C of  9.2% above goal <8% given recurrent hypoglycemia with seizures. She is tolerating Mounjaro  2.5 mg weekly well. She reports that she has appetite suppression, but per dietary recall she is still eating regular portions of food throughout the day. Discussed avoiding carbohydrate heavy foods and eating small portions as we titrate her GLP-1/GIP RA. Per SMBG, patient continues to have persistent hyperglycemia despite TDD of insulin  of 190 units. Will adjust basal insulin  today and continue to cautiously titrate Mounjaro . Patient has upcoming appt with endocrinology.  - Last UACR 02/25/24: 146 mg/g. Patient is treated with SGLT2i - need to continue to monitor closely for development of GU infections given risk factors of female sex, obesity, and poor glycemic control.  - Reviewed long term cardiovascular and renal outcomes of uncontrolled blood sugar - Reviewed goal A1c, goal fasting, and goal 2 hour post prandial glucose - Reviewed hypoglycemia management plan and the rule of 15 - Reviewed dietary modifications including  utilizing the healthy plate method, limiting portion size of carbohydrate foods, increasing intake of protein and non-starchy vegetables. Counseled patient to stay hydrated with water throughout the day. - Recommend to continue Jardiance  10 mg daily - Recommend to continue metformin  XR to 1000 mg BID. Continue to monitor eGFR to ensure it stays > 45 mL/min - Recommend to INCREASE Toujeo  U300 to 80 units once daily in the evening. - Recommend to continue Humalog  to 40 units TID WITH MEALS - Recommend to INCREASE Mounjaro  to  5 mg once weekly at next fill - Recommend to drink water only and avoid sugar containing beverages - Recommend to eat a small meal that contains protein three time daily, regardless of blood sugar.  - Will attempt follow-up with DME supplier on status of FL3+ CGM. - Next A1C due now - expect to be worse than previous based on SMBG. - Encouraged patient to get labs done  as instructed by nephrology provider given recent changes in diuretic and addition of ARB.  Written patient instructions provided. Patient verbalized understanding of treatment plan.   Follow Up Plan:  PCP needs to be scheduled Pharmacist telephone 07/11/24 (2 weeks)   Lorain Baseman, PharmD [facility] [phone]

## 2024-07-14 ENCOUNTER — Other Ambulatory Visit (HOSPITAL_BASED_OUTPATIENT_CLINIC_OR_DEPARTMENT_OTHER): Payer: Self-pay

## 2024-07-14 ENCOUNTER — Other Ambulatory Visit: Payer: Self-pay | Admitting: Nurse Practitioner

## 2024-07-14 MED ORDER — NITROGLYCERIN 0.4 MG SL SUBL
0.4000 mg | SUBLINGUAL_TABLET | SUBLINGUAL | 0 refills | Status: AC | PRN
  Filled 2024-07-14 – 2024-07-21 (×2): qty 25, 30d supply, fill #0

## 2024-07-14 NOTE — Progress Notes (Signed)
 Placed orders for Freestyle Libre 3+ sensor and reader to [facility] via [place] today. Patient reported she was successful in downloading the app and setting up a sensor, though it sweated out overnight. Encouraged her to try application again.   Also encouraged her to call [facility] to schedule f/u as she is now overdue for an A1C. Will continue to follow closely.   Lorain Baseman, PharmD [facility] [phone]

## 2024-07-15 ENCOUNTER — Other Ambulatory Visit (HOSPITAL_BASED_OUTPATIENT_CLINIC_OR_DEPARTMENT_OTHER): Payer: Self-pay

## 2024-07-17 ENCOUNTER — Other Ambulatory Visit: Payer: Self-pay

## 2024-07-18 ENCOUNTER — Other Ambulatory Visit: Payer: Self-pay

## 2024-07-21 ENCOUNTER — Other Ambulatory Visit (HOSPITAL_BASED_OUTPATIENT_CLINIC_OR_DEPARTMENT_OTHER): Payer: Self-pay

## 2024-07-21 ENCOUNTER — Other Ambulatory Visit: Payer: Self-pay

## 2024-07-21 ENCOUNTER — Other Ambulatory Visit: Payer: Self-pay | Admitting: Nurse Practitioner

## 2024-07-21 MED ORDER — METOPROLOL SUCCINATE ER 100 MG PO TB24
100.0000 mg | ORAL_TABLET | Freq: Every day | ORAL | 0 refills | Status: DC
  Filled 2024-07-21: qty 90, 90d supply, fill #0

## 2024-07-22 ENCOUNTER — Other Ambulatory Visit: Payer: Self-pay

## 2024-07-22 ENCOUNTER — Other Ambulatory Visit: Payer: Self-pay | Admitting: Nurse Practitioner

## 2024-07-22 DIAGNOSIS — I5032 Chronic diastolic (congestive) heart failure: Secondary | ICD-10-CM

## 2024-07-22 MED ORDER — SPIRONOLACTONE 25 MG PO TABS
25.0000 mg | ORAL_TABLET | Freq: Every day | ORAL | 0 refills | Status: DC
  Filled 2024-07-22: qty 90, 90d supply, fill #0

## 2024-07-23 ENCOUNTER — Encounter (HOSPITAL_BASED_OUTPATIENT_CLINIC_OR_DEPARTMENT_OTHER): Payer: Self-pay

## 2024-07-23 ENCOUNTER — Other Ambulatory Visit: Payer: Self-pay

## 2024-07-23 ENCOUNTER — Other Ambulatory Visit (HOSPITAL_COMMUNITY): Payer: Self-pay

## 2024-07-23 MED ORDER — OXYCODONE HCL 10 MG PO TABS
10.0000 mg | ORAL_TABLET | Freq: Four times a day (QID) | ORAL | 0 refills | Status: DC | PRN
  Filled 2024-07-23: qty 37, 10d supply, fill #0
  Filled 2024-07-23: qty 83, 20d supply, fill #0
  Filled 2024-07-23: qty 120, 30d supply, fill #0

## 2024-07-23 MED ORDER — DICLOFENAC SODIUM 1 % EX GEL
CUTANEOUS | 1 refills | Status: DC
  Filled 2024-07-23: qty 300, 20d supply, fill #0

## 2024-07-24 ENCOUNTER — Institutional Professional Consult (permissible substitution) (HOSPITAL_BASED_OUTPATIENT_CLINIC_OR_DEPARTMENT_OTHER): Admitting: Family

## 2024-07-25 ENCOUNTER — Other Ambulatory Visit: Payer: Self-pay

## 2024-07-28 ENCOUNTER — Other Ambulatory Visit (HOSPITAL_COMMUNITY): Payer: Self-pay

## 2024-07-28 ENCOUNTER — Other Ambulatory Visit: Payer: Self-pay

## 2024-07-28 ENCOUNTER — Encounter: Attending: Physical Medicine and Rehabilitation | Admitting: Physical Medicine and Rehabilitation

## 2024-07-28 ENCOUNTER — Telehealth: Payer: Self-pay

## 2024-07-28 ENCOUNTER — Encounter: Payer: Self-pay | Admitting: Physical Medicine and Rehabilitation

## 2024-07-28 VITALS — BP 128/83 | HR 94 | Ht 64.0 in | Wt 295.0 lb

## 2024-07-28 DIAGNOSIS — M1710 Unilateral primary osteoarthritis, unspecified knee: Secondary | ICD-10-CM | POA: Diagnosis present

## 2024-07-28 DIAGNOSIS — G629 Polyneuropathy, unspecified: Secondary | ICD-10-CM | POA: Diagnosis not present

## 2024-07-28 MED ORDER — TIZANIDINE HCL 4 MG PO TABS
4.0000 mg | ORAL_TABLET | Freq: Three times a day (TID) | ORAL | 0 refills | Status: DC | PRN
  Filled 2024-07-28: qty 90, 30d supply, fill #0

## 2024-07-28 MED ORDER — CAPSAICIN-CLEANSING GEL 8 % EX KIT
4.0000 | PACK | Freq: Once | CUTANEOUS | Status: AC
  Administered 2024-07-28: 4 via TOPICAL

## 2024-07-28 NOTE — Progress Notes (Signed)
 -  Discussed Qutenza as an option for neuropathic pain control. Discussed that this is a capsaicin patch, stronger than capsaicin cream. Discussed that it is currently approved for diabetic peripheral neuropathy and post-herpetic neuralgia, but that it has also shown benefit in treating other forms of neuropathy. Provided patient with link to site to learn more about the patch: https://www.clark.biz/. Discussed that the patch would be placed in office and benefits usually last 3 months. Discussed that unintended exposure to capsaicin can cause severe irritation of eyes, mucous membranes, respiratory tract, and skin, but that Qutenza is a local treatment and does not have the systemic side effects of other nerve medications. Discussed that there may be pain, itching, erythema, and decreased sensory function associated with the application of Qutenza. Side effects usually subside within 1 week. A cold pack of analgesic medications can help with these side effects. Blood pressure can also be increased due to pain associated with administration of the patch.   4 patches of Qutenza [phone]) was applied to bilateral feet. Ice packs were applied during the procedure to ensure patient comfort. Blood pressure was monitored every 15 minutes. The patient tolerated the procedure well. Post-procedure instructions were given and follow-up has been scheduled.  Topical system measures 14cm x20cm (280cm for a total 1120units) were applied which will cause deeper penetration for destruction of the peripheral nerve using a chemical (Qutenza) which infuses into the skin like an injection and heat technique (occlusive, compressive dressing cauing endothermic heat technique)

## 2024-07-28 NOTE — Progress Notes (Signed)
 Contacted [facility] to f/u on her CGM order. They have not been able to reach her on the phone. Need to confirm address and have her sign authorization of benefits. Of note, representative says they currently only have her Medicare. Unclear whether she has secondary coverage, so copay may be the same ($30/mo) as at the pharmacy. Will continue to troubleshoot and ask patient to contact [facility] to get a final answer on the copay, as they were unable to give that to me today.  Lorain Baseman, PharmD [facility] [phone]

## 2024-07-29 ENCOUNTER — Encounter (HOSPITAL_COMMUNITY): Payer: Self-pay | Admitting: Family

## 2024-07-29 ENCOUNTER — Other Ambulatory Visit: Payer: Self-pay

## 2024-07-29 ENCOUNTER — Ambulatory Visit (HOSPITAL_COMMUNITY): Payer: Self-pay | Admitting: Family

## 2024-07-29 VITALS — BP 171/93 | HR 88 | Ht 64.0 in | Wt 295.0 lb

## 2024-07-29 DIAGNOSIS — G479 Sleep disorder, unspecified: Secondary | ICD-10-CM

## 2024-07-29 DIAGNOSIS — F419 Anxiety disorder, unspecified: Secondary | ICD-10-CM

## 2024-07-29 MED ORDER — RAMELTEON 8 MG PO TABS: 8.0000 mg | ORAL_TABLET | Freq: Every day | ORAL | 0 refills | Status: DC

## 2024-07-29 NOTE — Progress Notes (Signed)
 Psychiatric Initial Adult Assessment   Patient Identification: Jill Shaw MRN:  [id number] Date of Evaluation:  07/29/2024 Referral Source: Primary care provider Chief Complaint: My main concern is sleep, I do not feel relaxed at night I need something to help relax me.  Visit Diagnosis:    ICD-10-CM   1. Anxiety  F41.9     2. Sleep disturbance  G47.9       History of Present Illness:  Jill Shaw is a 48 year old African-American female who presents to establish care.  She was seen and evaluated face-to-face by this provider.  States she was referred by her primary care provider due to sleep disturbance.  Reports her symptoms include increased anxiety, irritability, decreased energy, sleep issues, poor concentration and weight concerns.  States she has concerns related to not been able to sleep and not been around a lot of people.  Stated she recently started experiencing social isolation.She reports she has tried multiple sleep medications in the past to include trazodone , hydroxyzine , amitriptyline , melatonin and doxepin.  Documented Ambien  headaches.  reports medications did not help.  Sameria states I need something to help relax me, my cousin is prescribed Xanax:  Sole reports she currently resides with her mother.  States she has 3 children.  She denied that she is followed by therapy services.  No previous inpatient admission.  Denied history related to verbal, emotional or physical abuse in the past.  Denied illicit drug use or substance use history.  Reports medical history related to anemia, arthritis, back issues, diabetes, difficulty walking, hypertension, numbness of arms and legs, shortness of breath and racing thoughts.  Reports she is currently followed by pain management.  PHQ-9 13 and GAD-7 11 reports a history of tobacco use.  Sleep disturbance: Anxiety:  Discussed initiating Rozerem  8 mg - Allergy to hydroxyzine  reported rash - Consideration for  initiating SSRI/SNRI at follow-up appointment.  And/or mood stabilization medication to help with reported symptoms.  She was amendable to plan  During evaluation ELLIEANNA FUNDERBURG is sitting,she is alert/oriented x 4; calm/cooperative; and mood congruent with affect.  Patient is speaking in a clear tone at moderate volume, and normal pace; with good eye contact. Her thought process is coherent and relevant; There is no indication that she is currently responding to internal/external stimuli or experiencing delusional thought content.  Patient denies suicidal/self-harm/homicidal ideation, psychosis, and paranoia.  Patient has remained calm throughout assessment and has answered questions appropriately.   Associated Signs/Symptoms: Depression Symptoms:  difficulty concentrating, anxiety, disturbed sleep, (Hypo) Manic Symptoms:  Distractibility, Anxiety Symptoms:  Excessive Worry, Panic Symptoms, Psychotic Symptoms:  Hallucinations: None PTSD Symptoms: Avoidance:  Decreased Interest/Participation  Past Psychiatric History:   Previous Psychotropic Medications: Yes   Substance Abuse History in the last 12 months:  No.  Consequences of Substance Abuse: NA  Past Medical History:  Past Medical History:  Diagnosis Date   Abnormal uterine bleeding (AUB)    Arthritis    knees, hands   Atypical chest pain    cardiology--- dr elmira did cardiac cath 08-30-2021 showed minimal luminal irregularity involving LAD all other coronaries w/ normal  flow   Chronic diastolic (congestive) heart failure [facility])    cardiologist---- dr elmira;  preserved ef   Chronic iron  deficiency anemia    Chronic pain syndrome    followed by pain management--- dr lorilee   CKD (chronic kidney disease), stage III (HCC)    Diabetic gastroparesis (HCC)    Diabetic peripheral neuropathy (HCC)  Fibromyalgia    GAD (generalized anxiety disorder)    Generalized abdominal pain    GERD (gastroesophageal reflux  disease)    History of acute renal failure 03/21/2023   admission in epic due to N/V/D due ot severe sepsis POA due to UTI   History of chronic gastritis    inflammatory   History of diabetic ketoacidosis    multiple admission's last 3 in epic 06/ 2022;  01/ 2022;   09/ 2021   History of seizure 01/2016   hypoglycemic seizure   History of vertebral compression fracture 12/2020   T11 -- T12 & L1   Hyperlipidemia    Hypertension    followed by pcp   Insulin  dependent type 2 diabetes mellitus (HCC)    uncontrolled,  followed by pcp   Irritable bowel syndrome with constipation    MDD (major depressive disorder)    Moderate COPD (chronic obstructive pulmonary disease) (HCC)    pulmology--- dr brenna   Moderate persistent asthma    Sickle cell trait    Vitamin D  deficiency 10/2019   Wears glasses     Past Surgical History:  Procedure Laterality Date   CATARACT EXTRACTION W/ INTRAOCULAR LENS IMPLANT Left    CESAREAN SECTION  2001   for twins   DILATION AND CURETTAGE OF UTERUS N/A 08/20/2019   Procedure: DILATATION AND CURETTAGE;  Surgeon: Starla Harland BROCKS, MD;  Location: [facility] OR;  Service: Gynecology;  Laterality: N/A;   DILATION AND CURETTAGE OF UTERUS  05/01/2023   Procedure: DILATATION AND CURETTAGE;  Surgeon: Jeralyn Crutch, MD;  Location: [facility];  Service: Gynecology;;   ENDOMETRIAL ABLATION N/A 08/20/2019   Procedure: Lela Ablation;  Surgeon: Starla Harland BROCKS, MD;  Location: [facility] OR;  Service: Gynecology;  Laterality: N/A;   EYE SURGERY Bilateral    laser right and cataract removed left eye   HYSTEROSCOPY N/A 05/01/2023   Procedure: HYSTEROSCOPY;  Surgeon: Jeralyn Crutch, MD;  Location: [facility];  Service: Gynecology;  Laterality: N/A;   INTRAUTERINE DEVICE (IUD) INSERTION N/A 05/01/2023   Procedure: INTRAUTERINE DEVICE (IUD) INSERTION;  Surgeon: Jeralyn Crutch, MD;  Location:  SURGERY CENTER;  Service: Gynecology;  Laterality:  N/A;   LEFT HEART CATH AND CORONARY ANGIOGRAPHY N/A 08/30/2021   Procedure: LEFT HEART CATH AND CORONARY ANGIOGRAPHY;  Surgeon: Elmira Newman PARAS, MD;  Location: [facility];  Service: Cardiovascular;  Laterality: N/A;   RADIOLOGY WITH ANESTHESIA N/A 09/16/2019   Procedure: MRI WITH ANESTHESIA   L SPINE WITHOUT CONTRAST, T SPINE WITHOUT CONTRAST , CERVICAL WITHOUT CONTRAST;  Surgeon: Radiologist, Medication, MD;  Location: [facility];  Service: Radiology;  Laterality: N/A;   TOOTH EXTRACTION N/A 01/14/2024   Procedure: DENTAL RESTORATION/EXTRACTIONS;  Surgeon: Sheryle Hamilton, DMD;  Location: [facility];  Service: Oral Surgery;  Laterality: N/A;   TUBAL LIGATION     interval BTL   UPPER GI ENDOSCOPY  07/2017    Family Psychiatric History: reported cousion has mental illness  she has it real bad.   Family History:  Family History  Problem Relation Age of Onset   Diabetes Mother    Hypertension Mother    Colon cancer Maternal Grandfather    Migraines Paternal Grandfather    Colon cancer Maternal Aunt    Breast cancer Maternal Aunt    Esophageal cancer Neg Hx    Stomach cancer Neg Hx    Rectal cancer Neg Hx     Social History:   Social History  Socioeconomic History   Marital status: Divorced    Spouse name: Not on file   Number of children: 3   Years of education: Not on file   Highest education level: Not on file  Occupational History   Occupation: unemployed  Tobacco Use   Smoking status: Some Days    Current packs/day: 0.00    Average packs/day: 0.3 packs/day for 26.0 years (6.5 ttl pk-yrs)    Types: Cigarettes    Start date: 09/13/1994    Last attempt to quit: 09/13/2020    Years since quitting: 3.8   Smokeless tobacco: Never   Tobacco comments:    Pt smokes 1/2 ppd. AB. CMA 09-10-23  Vaping Use   Vaping status: Never Used  Substance and Sexual Activity   Alcohol use: No   Drug use: No   Sexual activity: Not Currently    Birth control/protection: I.U.D.   Other Topics Concern   Not on file  Social History Narrative   Right Handed   Lives in a one story apartment, but lives on the second floor   Drinks caffeine  once in awhile   Social Drivers of Health   Financial Resource Strain: Low Risk  (03/18/2023)   Overall Financial Resource Strain (CARDIA)    Difficulty of Paying Living Expenses: Not very hard  Food Insecurity: No Food Insecurity (03/27/2024)   Hunger Vital Sign    Worried About Running Out of Food in the Last Year: Never true    Ran Out of Food in the Last Year: Never true  Transportation Needs: No Transportation Needs (03/27/2024)   PRAPARE - Administrator, Civil Service (Medical): No    Lack of Transportation (Non-Medical): No  Physical Activity: Inactive (03/18/2023)   Exercise Vital Sign    Days of Exercise per Week: 0 days    Minutes of Exercise per Session: 0 min  Stress: Stress Concern Present (03/18/2023)   [employer] of Occupational Health - Occupational Stress Questionnaire    Feeling of Stress : To some extent  Social Connections: Moderately Isolated (02/04/2024)   Social Connection and Isolation Panel    Frequency of Communication with Friends and Family: More than three times a week    Frequency of Social Gatherings with Friends and Family: Three times a week    Attends Religious Services: 1 to 4 times per year    Active Member of Clubs or Organizations: No    Attends Banker Meetings: Never    Marital Status: Separated    Additional Social History:   Allergies:   Allergies  Allergen Reactions   Elavil  [Amitriptyline ] Other (See Comments)    Coma   Orudis [Ketoprofen] Nausea And Vomiting   Desyrel  [Trazodone ] Nausea And Vomiting   Tylenol  [Acetaminophen ] Nausea And Vomiting   Ambien  [Zolpidem  Tartrate] Nausea And Vomiting    She also reported headache   Aspirin  Nausea Only   Hydroxyzine  Hcl Rash   Motrin  [Ibuprofen ] Nausea And Vomiting   Naprosyn [Naproxen] Nausea And  Vomiting   Neurontin [Gabapentin] Nausea And Vomiting and Other (See Comments)    upset stomach   Sulfa Antibiotics Nausea And Vomiting   Ultram  [Tramadol ] Nausea And Vomiting and Other (See Comments)    stomach upset   Victoza [Liraglutide] Nausea And Vomiting   Zegerid [Omeprazole -Sodium Bicarbonate ] Nausea And Vomiting    Metabolic Disorder Labs: Lab Results  Component Value Date   HGBA1C 9.2 (H) 03/27/2024   MPG 217.34 03/27/2024   MPG 188.64 07/29/2023  No results found for: PROLACTIN Lab Results  Component Value Date   CHOL 153 12/09/2021   TRIG 144 12/09/2021   HDL 54 12/09/2021   CHOLHDL 2.8 12/09/2021   VLDL 29 03/09/2017   LDLCALC 74 12/09/2021   LDLCALC 48 07/29/2021   Lab Results  Component Value Date   TSH 1.813 07/30/2023    Therapeutic Level Labs: No results found for: LITHIUM No results found for: CBMZ No results found for: VALPROATE  Current Medications: Current Outpatient Medications  Medication Sig Dispense Refill   ramelteon  (ROZEREM ) 8 MG tablet Take 1 tablet (8 mg total) by mouth at bedtime. 30 tablet 0   Accu-Chek Softclix Lancets lancets Use as instructed to monitor blood sugar 4 times daily 300 each 4   albuterol  (PROVENTIL ) (2.5 MG/3ML) 0.083% nebulizer solution Take 3 mLs (2.5 mg total) by nebulization every 6 (six) hours as needed for wheezing or shortness of breath. 75 mL 2   albuterol  (VENTOLIN  HFA) 108 (90 Base) MCG/ACT inhaler Inhale 2 puffs into the lungs every 6 (six) hours as needed for wheezing or shortness of breath. 8.5 g 3   Blood Glucose Monitoring Suppl (ACCU-CHEK GUIDE) w/Device KIT Use as directed to monitor blood sugar 4 times daily (before each meal and before bed). 1 kit 0   budesonide -formoterol  (SYMBICORT ) 160-4.5 MCG/ACT inhaler Inhale 2 puffs into the lungs 2 (two) times daily. 10.2 g 5   busPIRone  (BUSPAR ) 10 MG tablet Take 1 tablet (10 mg total) by mouth 2 (two) times daily. 60 tablet 2   Continuous Glucose  Sensor (FREESTYLE LIBRE 3 PLUS SENSOR) MISC Change sensor every 15 days. 2 each 11   Dexlansoprazole  30 MG capsule DR Take 1 capsule (30 mg total) by mouth daily. NEEDS OFFICE VISIT FOR ADDITIONAL REFILLS 90 capsule 1   diclofenac  Sodium (VOLTAREN ) 1 % GEL Apply 2 to 4 gram to painful sites up to 4 times daily if needed, max daily dose: 32 Gram 300 g 1   diclofenac  Sodium (VOLTAREN ) 1 % GEL apply 2 to 4 gram to painful sites up to 4 times daily if needed, max daily dose: 32 Gram 300 g 1   diclofenac  Sodium (VOLTAREN ) 1 % GEL Apply 2 to 4 grams to painful sites up to 4 times daily if needed, max daily dose 32 grams 300 g 1   diclofenac  Sodium (VOLTAREN ) 1 % GEL Apply 2 to 4 gram to painful sites up to 4 times daily if needed, max daily dose: 32 Gram 300 g 1   diclofenac  Sodium (VOLTAREN ) 1 % GEL Apply 2 to 4 gram to painful sites up to 4 times daily if needed, max daily dose: 32 Gram 300 g 1   dicyclomine  (BENTYL ) 20 MG tablet Take 1 tablet (20 mg total) by mouth 2 (two) times daily. 60 tablet 3   empagliflozin  (JARDIANCE ) 10 MG TABS tablet Take 1 tablet (10 mg total) by mouth daily. 30 tablet 0   ferrous sulfate  325 (65 FE) MG tablet Take 1 tablet (325 mg total) by mouth daily with breakfast. 30 tablet 0   glucose blood (ACCU-CHEK GUIDE TEST) test strip Use as instructed to monitor blood sugar 4 times daily 300 each 4   hydrALAZINE  (APRESOLINE ) 25 MG tablet Take 1 tablet (25 mg total) by mouth 3 (three) times daily. 90 tablet 0   insulin  glargine, 2 Unit Dial , (TOUJEO  MAX SOLOSTAR) 300 UNIT/ML Solostar Pen Inject 80 Units into the skin daily. May increase up to 100 units  daily if instructed by your provider. 12 mL 5   insulin  lispro (HUMALOG  KWIKPEN) 100 UNIT/ML KwikPen Inject 30 Units into the skin with breakfast, with lunch, and with evening meal. May increase up to 40 units with each meal if instructed by your provider. 36 mL 5   Insulin  Pen Needle (PEN NEEDLES) 31G X 5 MM MISC Use as instructed to  inject insulin  four times day 200 each 3   levocetirizine (XYZAL) 5 MG tablet Take 5 mg by mouth every evening.     losartan  (COZAAR ) 25 MG tablet 1 tab by mouth daily 90 tablet 3   Melatonin 10 MG TABS Take 10 mg by mouth at bedtime as needed (insomnia). 30 tablet 0   metFORMIN  (GLUCOPHAGE -XR) 500 MG 24 hr tablet Take 2 tablets (1,000 mg total) by mouth 2 (two) times daily with a meal. 360 tablet 1   metoCLOPramide  (REGLAN ) 10 MG tablet Take 1 tablet (10 mg total) by mouth every 8 (eight) hours as needed for nausea. 20 tablet 0   metoprolol  succinate (TOPROL -XL) 100 MG 24 hr tablet Take 1 tablet (100 mg total) by mouth daily. Take with or immediately following a meal. 90 tablet 0   naloxone  (NARCAN ) nasal spray 4 mg/0.1 mL Insert 1 spray into one nostril if poorly responding / turning blue. CALL 911 ASAP 2 each 0   nitroGLYCERIN  (NITROSTAT ) 0.4 MG SL tablet Place 1 tablet (0.4 mg total) under the tongue every 5 (five) minutes as needed for chest pain. Additional refills to be filled by PCP, patient aware 25 tablet 0   norethindrone  (AYGESTIN ) 5 MG tablet Take 1 tablet (5 mg total) by mouth 2 (two) times daily as needed (bleeding). 90 tablet 8   ondansetron  (ZOFRAN -ODT) 4 MG disintegrating tablet Take 1 tablet (4 mg total) by mouth every 8 (eight) hours as needed for nausea or vomiting. 20 tablet 0   Oxycodone  HCl 10 MG TABS Take 1 tablet (10 mg total) by mouth 4 (four) times daily as needed for pain 120 tablet 0   Oxycodone  HCl 10 MG TABS Take 1 tablet (10 mg total) by mouth 4 (four) times daily if needed for pain 120 tablet 0   Oxycodone  HCl 10 MG TABS Take 1 tablet (10 mg total) by mouth 4 (four) times daily as needed for pain 06/20/24 12 tablet 0   Oxycodone  HCl 10 MG TABS Take 1 tablet (10 mg total) by mouth 4 (four) times daily as needed for pain. 120 tablet 0   potassium chloride  SA (KLOR-CON  M) 20 MEQ tablet Take 1 tablet (20 mEq total) by mouth daily. 30 tablet 0   pregabalin  (LYRICA ) 150 MG  capsule Take 1 capsule (150 mg total) by mouth 3 (three) times daily. 270 capsule 3   rosuvastatin  (CRESTOR ) 20 MG tablet Take 1 tablet (20 mg total) by mouth daily. KEEP OV. 90 tablet 0   senna (SENOKOT) 8.6 MG TABS tablet Take 1 tablet (8.6 mg total) by mouth daily. 120 tablet 0   spironolactone  (ALDACTONE ) 25 MG tablet Take 1 tablet (25 mg total) by mouth daily. 90 tablet 0   sucralfate  (CARAFATE ) 1 g tablet Take 1 tablet (1 g total) by mouth 4 (four) times daily -  with meals and at bedtime. 120 tablet 3   tirzepatide  (MOUNJARO ) 5 MG/0.5ML Pen Inject 5 mg into the skin once a week. 2 mL 3   tiZANidine  (ZANAFLEX ) 4 MG tablet Take 1 tablet (4 mg total) by mouth every 8 (  eight) hours as needed for muscle spasms. 90 tablet 0   torsemide  (DEMADEX ) 20 MG tablet Take 3 tablets (60 mg total) by mouth in the morning for swelling. 270 tablet 3   No current facility-administered medications for this visit.    Musculoskeletal: Strength & Muscle Tone: within normal limits Gait & Station: normal Patient leans: N/A  Psychiatric Specialty Exam: Review of Systems  Blood pressure (!) 171/93, pulse 88, height 5' 4 (1.626 m), weight 295 lb (133.8 kg).Body mass index is 50.64 kg/m.  General Appearance: Casual  Eye Contact:  Good  Speech:  Clear and Coherent  Volume:  Normal  Mood:  Anxious  Affect:  Congruent  Thought Process:  Coherent  Orientation:  Full (Time, Place, and Person)  Thought Content:  Logical  Suicidal Thoughts:  No  Homicidal Thoughts:  No  Memory:  Immediate;   Good Recent;   Good  Judgement:  Fair  Insight:  Fair  Psychomotor Activity:  Restlessness  Concentration:  Concentration: Fair  Recall:  Good  Fund of Knowledge:Good  Language: Fair  Akathisia:  No  Handed:  Right  AIMS (if indicated):  not done  Assets:  Communication Skills Desire for Improvement  ADL's:  Intact  Cognition: WNL  Sleep:  Poor 3-4 hours of sleep sometime less   Screenings: GAD-7     Flowsheet Row Office Visit from 04/03/2023 in [facility] Office Visit from 09/05/2022 in [facility] Office Visit from 07/25/2021 in [facility] Clinical Support from 04/15/2021 in [place] Office Visit from 01/20/2021 in [facility]  Total GAD-7 Score 10 0 12 4 9    PHQ2-9    Flowsheet Row Office Visit from 07/29/2024 [facility] Office Visit from 02/14/2024 in [facility] Office Visit from 12/21/2023 in [facility] Office Visit from 07/26/2023 in [facility] Office Visit from 05/18/2023 in [facility]  PHQ-2 Total Score 4 0 0 0 0  PHQ-9 Total Score 13 -- -- -- --   Flowsheet Row ED to Hosp-Admission (Discharged) from 03/27/2024 in [facility] from 02/14/2024 in [facility] ED to Hosp-Admission (Discharged) from 02/03/2024 in [facility]  C-SSRS RISK CATEGORY No Risk No Risk No Risk    Assessment and Plan: Amalie Koran is a 48 year old female who presents to establish care.  Reports she was referred by her primary care due to sleep disturbance.  States she has tried multiple medications in the past.  Reports adverse or intolerability to multiple medications listed throughout this assessment.  To include melatonin, trazodone , doxepin, amitriptyline , hydroxyzine , Ambien . Consideration for sleep study.  No concerns related to suicidal or homicidal ideation.  Denies auditory visual hallucinations.  Patient to follow-up in 1 month for medication  management/adherence.  Support encouragement reassurance was provided  Collaboration of Care: Medication Management AEB initiated Rozerem   Patient/Guardian was advised Release of Information must be obtained prior to any record release in order to collaborate their care with an outside provider. Patient/Guardian was advised if they have not already done so to contact the  registration department to sign all necessary forms in order for us  to release information regarding their care.   Consent: Patient/Guardian gives verbal consent for treatment and assignment of benefits for services provided during this visit. Patient/Guardian expressed understanding and agreed to proceed.   Staci LOISE Kerns, NP 10/14/20252:43 PM

## 2024-07-30 ENCOUNTER — Other Ambulatory Visit: Payer: Self-pay

## 2024-07-31 ENCOUNTER — Encounter (HOSPITAL_BASED_OUTPATIENT_CLINIC_OR_DEPARTMENT_OTHER): Payer: Self-pay

## 2024-07-31 ENCOUNTER — Other Ambulatory Visit: Payer: Self-pay

## 2024-07-31 ENCOUNTER — Other Ambulatory Visit: Payer: Self-pay | Admitting: Physical Medicine and Rehabilitation

## 2024-07-31 ENCOUNTER — Emergency Department (HOSPITAL_BASED_OUTPATIENT_CLINIC_OR_DEPARTMENT_OTHER)
Admission: EM | Admit: 2024-07-31 | Discharge: 2024-08-01 | Disposition: A | Discharge status: Home / Self Care | Attending: Emergency Medicine | Admitting: Emergency Medicine

## 2024-07-31 ENCOUNTER — Emergency Department (HOSPITAL_BASED_OUTPATIENT_CLINIC_OR_DEPARTMENT_OTHER): Admitting: Radiology

## 2024-07-31 ENCOUNTER — Other Ambulatory Visit: Payer: Self-pay | Admitting: Cardiology

## 2024-07-31 ENCOUNTER — Telehealth: Payer: Self-pay | Admitting: Physical Medicine and Rehabilitation

## 2024-07-31 DIAGNOSIS — E1122 Type 2 diabetes mellitus with diabetic chronic kidney disease: Secondary | ICD-10-CM | POA: Insufficient documentation

## 2024-07-31 DIAGNOSIS — I5032 Chronic diastolic (congestive) heart failure: Secondary | ICD-10-CM | POA: Diagnosis not present

## 2024-07-31 DIAGNOSIS — N183 Chronic kidney disease, stage 3 unspecified: Secondary | ICD-10-CM | POA: Insufficient documentation

## 2024-07-31 DIAGNOSIS — M5432 Sciatica, left side: Secondary | ICD-10-CM | POA: Insufficient documentation

## 2024-07-31 DIAGNOSIS — E162 Hypoglycemia, unspecified: Secondary | ICD-10-CM

## 2024-07-31 DIAGNOSIS — Z7984 Long term (current) use of oral hypoglycemic drugs: Secondary | ICD-10-CM | POA: Diagnosis not present

## 2024-07-31 DIAGNOSIS — E782 Mixed hyperlipidemia: Secondary | ICD-10-CM

## 2024-07-31 DIAGNOSIS — Z794 Long term (current) use of insulin: Secondary | ICD-10-CM | POA: Insufficient documentation

## 2024-07-31 DIAGNOSIS — M549 Dorsalgia, unspecified: Secondary | ICD-10-CM | POA: Diagnosis present

## 2024-07-31 DIAGNOSIS — I130 Hypertensive heart and chronic kidney disease with heart failure and stage 1 through stage 4 chronic kidney disease, or unspecified chronic kidney disease: Secondary | ICD-10-CM | POA: Diagnosis not present

## 2024-07-31 DIAGNOSIS — Z79899 Other long term (current) drug therapy: Secondary | ICD-10-CM | POA: Diagnosis not present

## 2024-07-31 LAB — CBC WITH DIFFERENTIAL/PLATELET
Abs Immature Granulocytes: 0.06 K/uL (ref 0.00–0.07)
Basophils Absolute: 0.1 K/uL (ref 0.0–0.1)
Basophils Relative: 0 %
Eosinophils Absolute: 0.2 K/uL (ref 0.0–0.5)
Eosinophils Relative: 2 %
HCT: 41.4 % (ref 36.0–46.0)
Hemoglobin: 13.9 g/dL (ref 12.0–15.0)
Immature Granulocytes: 1 %
Lymphocytes Relative: 23 %
Lymphs Abs: 2.6 K/uL (ref 0.7–4.0)
MCH: 27.4 pg (ref 26.0–34.0)
MCHC: 33.6 g/dL (ref 30.0–36.0)
MCV: 81.7 fL (ref 80.0–100.0)
Monocytes Absolute: 0.8 K/uL (ref 0.1–1.0)
Monocytes Relative: 7 %
Neutro Abs: 7.7 K/uL (ref 1.7–7.7)
Neutrophils Relative %: 67 %
Platelets: 219 K/uL (ref 150–400)
RBC: 5.07 MIL/uL (ref 3.87–5.11)
RDW: 15 % (ref 11.5–15.5)
WBC: 11.4 K/uL — ABNORMAL HIGH (ref 4.0–10.5)
nRBC: 0 % (ref 0.0–0.2)

## 2024-07-31 LAB — CBG MONITORING, ED
Glucose-Capillary: 41 mg/dL — CL (ref 70–99)
Glucose-Capillary: 39 mg/dL — CL (ref 70–99)
Glucose-Capillary: 94 mg/dL (ref 70–99)

## 2024-07-31 MED ORDER — DEXAMETHASONE SOD PHOSPHATE PF 10 MG/ML IJ SOLN
10.0000 mg | Freq: Once | INTRAMUSCULAR | Status: AC
  Administered 2024-07-31: 10 mg via INTRAVENOUS

## 2024-07-31 MED ORDER — MORPHINE SULFATE (PF) 4 MG/ML IV SOLN
4.0000 mg | Freq: Once | INTRAVENOUS | Status: AC
  Administered 2024-07-31: 4 mg via INTRAVENOUS
  Filled 2024-07-31: qty 1

## 2024-07-31 MED ORDER — GLUCOSE 40 % PO GEL
2.0000 | Freq: Once | ORAL | Status: AC
  Administered 2024-07-31: 62 g via ORAL
  Filled 2024-07-31: qty 2.42

## 2024-07-31 MED ORDER — DEXTROSE 50 % IV SOLN
1.0000 | Freq: Once | INTRAVENOUS | Status: AC
  Administered 2024-07-31: 50 mL via INTRAVENOUS

## 2024-07-31 MED ORDER — CYCLOBENZAPRINE HCL 5 MG PO TABS
5.0000 mg | ORAL_TABLET | Freq: Once | ORAL | Status: AC
  Administered 2024-07-31: 5 mg via ORAL
  Filled 2024-07-31: qty 1

## 2024-07-31 NOTE — ED Provider Notes (Signed)
 [place] EMERGENCY DEPARTMENT AT [facility] Provider Note   CSN: [id number] Arrival date & time: 07/31/24  2143     Patient presents with: Sciatica and Hypoglycemia   Jill Shaw is a 48 y.o. female.   HPI     This is a 48 year old female who presents with concern for left leg pain and back pain.  Reports she has had this for some time but over the last 2 weeks it has worsened.  Pain starts in her back and radiates down the left leg.  She is reporting significant pain.  Is refractory to her hydrocodone .  No bowel or bladder difficulty.  No weakness, numbness, tingling of the lower extremities.  Prior to Admission medications   Medication Sig Start Date End Date Taking? Authorizing Provider  Accu-Chek Softclix Lancets lancets Use as instructed to monitor blood sugar 4 times daily 06/23/24   Nichols, Tonya S, NP  albuterol  (PROVENTIL ) (2.5 MG/3ML) 0.083% nebulizer solution Take 3 mLs (2.5 mg total) by nebulization every 6 (six) hours as needed for wheezing or shortness of breath. 01/31/24 01/30/25  Parrett, Madelin RAMAN, NP  albuterol  (VENTOLIN  HFA) 108 (90 Base) MCG/ACT inhaler Inhale 2 puffs into the lungs every 6 (six) hours as needed for wheezing or shortness of breath. 11/26/23   Oley Bascom RAMAN, NP  Blood Glucose Monitoring Suppl (ACCU-CHEK GUIDE) w/Device KIT Use as directed to monitor blood sugar 4 times daily (before each meal and before bed). 06/23/24   Oley Bascom RAMAN, NP  budesonide -formoterol  (SYMBICORT ) 160-4.5 MCG/ACT inhaler Inhale 2 puffs into the lungs 2 (two) times daily. 11/26/23   Oley Bascom RAMAN, NP  busPIRone  (BUSPAR ) 10 MG tablet Take 1 tablet (10 mg total) by mouth 2 (two) times daily. 03/03/24   Oley Bascom RAMAN, NP  Continuous Glucose Sensor (FREESTYLE LIBRE 3 PLUS SENSOR) MISC Change sensor every 15 days. 03/31/24   Oley Bascom RAMAN, NP  Dexlansoprazole  30 MG capsule DR Take 1 capsule (30 mg total) by mouth daily. NEEDS OFFICE VISIT FOR ADDITIONAL REFILLS  04/24/24   Oley Bascom RAMAN, NP  diclofenac  Sodium (VOLTAREN ) 1 % GEL Apply 2 to 4 gram to painful sites up to 4 times daily if needed, max daily dose: 32 Gram 02/19/24     diclofenac  Sodium (VOLTAREN ) 1 % GEL apply 2 to 4 gram to painful sites up to 4 times daily if needed, max daily dose: 32 Gram 04/21/24     diclofenac  Sodium (VOLTAREN ) 1 % GEL Apply 2 to 4 grams to painful sites up to 4 times daily if needed, max daily dose 32 grams 05/21/24     diclofenac  Sodium (VOLTAREN ) 1 % GEL Apply 2 to 4 gram to painful sites up to 4 times daily if needed, max daily dose: 32 Gram 06/23/24     diclofenac  Sodium (VOLTAREN ) 1 % GEL Apply 2 to 4 gram to painful sites up to 4 times daily if needed, max daily dose: 32 Gram 07/23/24     dicyclomine  (BENTYL ) 20 MG tablet Take 1 tablet (20 mg total) by mouth 2 (two) times daily. 11/26/23   Oley Bascom RAMAN, NP  empagliflozin  (JARDIANCE ) 10 MG TABS tablet Take 1 tablet (10 mg total) by mouth daily. 06/20/24   Oley Bascom RAMAN, NP  ferrous sulfate  325 (65 FE) MG tablet Take 1 tablet (325 mg total) by mouth daily with breakfast. 03/30/24   Franchot Novel, MD  glucose blood (ACCU-CHEK GUIDE TEST) test strip Use as instructed to monitor blood  sugar 4 times daily 06/23/24   Oley Bascom RAMAN, NP  hydrALAZINE  (APRESOLINE ) 25 MG tablet Take 1 tablet (25 mg total) by mouth 3 (three) times daily. 05/28/24   Oley Bascom RAMAN, NP  insulin  glargine, 2 Unit Dial , (TOUJEO  MAX SOLOSTAR) 300 UNIT/ML Solostar Pen Inject 80 Units into the skin daily. May increase up to 100 units daily if instructed by your provider. 06/27/24   Oley Bascom RAMAN, NP  insulin  lispro (HUMALOG  KWIKPEN) 100 UNIT/ML KwikPen Inject 30 Units into the skin with breakfast, with lunch, and with evening meal. May increase up to 40 units with each meal if instructed by your provider. 06/13/24   Oley Bascom RAMAN, NP  Insulin  Pen Needle (PEN NEEDLES) 31G X 5 MM MISC Use as instructed to inject insulin  four times day 06/12/24   Nichols,  Tonya S, NP  levocetirizine (XYZAL) 5 MG tablet Take 5 mg by mouth every evening.    [provider]  losartan  (COZAAR ) 25 MG tablet 1 tab by mouth daily 06/12/24     Melatonin 10 MG TABS Take 10 mg by mouth at bedtime as needed (insomnia). 02/07/24   Patsy Lenis, MD  metFORMIN  (GLUCOPHAGE -XR) 500 MG 24 hr tablet Take 2 tablets (1,000 mg total) by mouth 2 (two) times daily with a meal. 04/29/24   [name], [name], FNP  metoCLOPramide  (REGLAN ) 10 MG tablet Take 1 tablet (10 mg total) by mouth every 8 (eight) hours as needed for nausea. 10/07/23   Rancour, Garnette, MD  metoprolol  succinate (TOPROL -XL) 100 MG 24 hr tablet Take 1 tablet (100 mg total) by mouth daily. Take with or immediately following a meal. 07/21/24   Oley Bascom RAMAN, NP  naloxone  (NARCAN ) nasal spray 4 mg/0.1 mL Insert 1 spray into one nostril if poorly responding / turning blue. CALL 911 ASAP 10/13/23     nitroGLYCERIN  (NITROSTAT ) 0.4 MG SL tablet Place 1 tablet (0.4 mg total) under the tongue every 5 (five) minutes as needed for chest pain. Additional refills to be filled by PCP, patient aware 07/14/24   Oley Bascom RAMAN, NP  norethindrone  (AYGESTIN ) 5 MG tablet Take 1 tablet (5 mg total) by mouth 2 (two) times daily as needed (bleeding). 06/09/24   Ajewole, Christana, MD  ondansetron  (ZOFRAN -ODT) 4 MG disintegrating tablet Take 1 tablet (4 mg total) by mouth every 8 (eight) hours as needed for nausea or vomiting. 05/27/24   Nichols, Tonya S, NP  Oxycodone  HCl 10 MG TABS Take 1 tablet (10 mg total) by mouth 4 (four) times daily as needed for pain 04/21/24     Oxycodone  HCl 10 MG TABS Take 1 tablet (10 mg total) by mouth 4 (four) times daily if needed for pain 05/21/24     Oxycodone  HCl 10 MG TABS Take 1 tablet (10 mg total) by mouth 4 (four) times daily as needed for pain 06/20/24 06/19/24     Oxycodone  HCl 10 MG TABS Take 1 tablet (10 mg total) by mouth 4 (four) times daily as needed for pain. 07/23/24     potassium chloride  SA  (KLOR-CON  M) 20 MEQ tablet Take 1 tablet (20 mEq total) by mouth daily. 03/11/24   Oley Bascom RAMAN, NP  pregabalin  (LYRICA ) 150 MG capsule Take 1 capsule (150 mg total) by mouth 3 (three) times daily. 06/10/24   Raulkar, Sven SQUIBB, MD  ramelteon  (ROZEREM ) 8 MG tablet Take 1 tablet (8 mg total) by mouth at bedtime. 07/29/24   Ezzard Staci SAILOR, NP  rosuvastatin  (CRESTOR )  20 MG tablet Take 1 tablet (20 mg total) by mouth daily. KEEP OV. 04/25/24   Patwardhan, Newman PARAS, MD  senna (SENOKOT) 8.6 MG TABS tablet Take 1 tablet (8.6 mg total) by mouth daily. 03/30/24   Franchot Novel, MD  spironolactone  (ALDACTONE ) 25 MG tablet Take 1 tablet (25 mg total) by mouth daily. 07/22/24   Oley Bascom RAMAN, NP  sucralfate  (CARAFATE ) 1 g tablet Take 1 tablet (1 g total) by mouth 4 (four) times daily -  with meals and at bedtime. 03/11/24   Zehr, Jessica D, PA-C  tirzepatide  (MOUNJARO ) 5 MG/0.5ML Pen Inject 5 mg into the skin once a week. 06/27/24   Oley Bascom RAMAN, NP  tiZANidine  (ZANAFLEX ) 4 MG tablet Take 1 tablet (4 mg total) by mouth every 8 (eight) hours as needed for muscle spasms. 07/28/24   Raulkar, Sven SQUIBB, MD  torsemide  (DEMADEX ) 20 MG tablet Take 3 tablets (60 mg total) by mouth in the morning for swelling. 06/12/24       Allergies: Elavil  [amitriptyline ], Orudis [ketoprofen], Desyrel  [trazodone ], Tylenol  [acetaminophen ], Ambien  [zolpidem  tartrate], Aspirin , Hydroxyzine  hcl, Motrin  [ibuprofen ], Naprosyn [naproxen], Neurontin [gabapentin], Sulfa antibiotics, Ultram  [tramadol ], Victoza [liraglutide], and Zegerid [omeprazole -sodium bicarbonate ]    Review of Systems  Constitutional:  Negative for fever.  Respiratory:  Negative for shortness of breath.   Cardiovascular:  Negative for chest pain.  Genitourinary:  Negative for difficulty urinating.  Musculoskeletal:  Positive for back pain.  Neurological:  Negative for weakness.  All other systems reviewed and are negative.   Updated Vital Signs BP (!)  144/83   Pulse 90   Temp 97.9 F (36.6 C)   Resp 18   SpO2 100%   Physical Exam Vitals and nursing note reviewed.  Constitutional:      Appearance: She is well-developed. She is obese. She is not ill-appearing.  HENT:     Head: Normocephalic and atraumatic.  Eyes:     Pupils: Pupils are equal, round, and reactive to light.  Cardiovascular:     Rate and Rhythm: Normal rate and regular rhythm.  Pulmonary:     Effort: Pulmonary effort is normal. No respiratory distress.  Abdominal:     Palpations: Abdomen is soft.  Musculoskeletal:     Cervical back: Neck supple.     Comments: Exam is difficult as patient does not tolerate much movement of the left leg, no asymmetric swelling, no calf tenderness or swelling, positive left straight leg raise, neurovascularly intact  Skin:    General: Skin is warm and dry.  Neurological:     Mental Status: She is alert and oriented to person, place, and time.  Psychiatric:     Comments: Tearful     (all labs ordered are listed, but only abnormal results are displayed) Labs Reviewed  CBC WITH DIFFERENTIAL/PLATELET - Abnormal; Notable for the following components:      Result Value   WBC 11.4 (*)    All other components within normal limits  BASIC METABOLIC PANEL WITH GFR - Abnormal; Notable for the following components:   Glucose, Bld 43 (*)    Creatinine, Ser 1.15 (*)    Calcium  10.5 (*)    GFR, Estimated 59 (*)    All other components within normal limits  CBG MONITORING, ED - Abnormal; Notable for the following components:   Glucose-Capillary 41 (*)    All other components within normal limits  CBG MONITORING, ED - Abnormal; Notable for the following components:   Glucose-Capillary 39 (*)  All other components within normal limits  HCG, SERUM, QUALITATIVE  CBG MONITORING, ED  CBG MONITORING, ED    EKG: None  Radiology: DG Lumbar Spine Complete Result Date: 07/31/2024 EXAM: 4 OR MORE VIEW(S) XRAY OF THE LUMBAR SPINE  07/31/2024 11:46:36 PM COMPARISON: None available. CLINICAL HISTORY: Pain. Pt states that she is having pain in her back that shoots down her L leg that has been going on for the past two weeks, and pt is also c/o of pain on the whole R side of her body from her neck to feet for 2 weeks as well, denies injury. Pt reports taking oxycodone  at home that is not helping. FINDINGS: LUMBAR SPINE: BONES: No acute fracture. No aggressive appearing osseous lesion. Alignment is normal. DISCS AND DEGENERATIVE CHANGES: Very mild degenerative changes at T12-L1 and L4-L5. SOFT TISSUES: IUD overlying the right pelvis. No acute soft tissue abnormality. IMPRESSION: 1. No acute osseous abnormality of the lumbar spine. Electronically signed by: Pinkie Pebbles MD 07/31/2024 11:52 PM EDT RP Workstation: [id number]     Procedures   Medications Ordered in the ED  dextrose  (GLUTOSE) oral gel 40% (peds > 20kg and adults) (62 g Oral Given 07/31/24 2214)  dextrose  50 % solution 50 mL (50 mLs Intravenous Given 07/31/24 2248)  dexamethasone  (DECADRON ) injection 10 mg (10 mg Intravenous Given 07/31/24 2328)  morphine  (PF) 4 MG/ML injection 4 mg (4 mg Intravenous Given 07/31/24 2326)  cyclobenzaprine  (FLEXERIL ) tablet 5 mg (5 mg Oral Given 07/31/24 2329)                                    Medical Decision Making Amount and/or Complexity of Data Reviewed Labs: ordered. Radiology: ordered.  Risk OTC drugs. Prescription drug management.   This patient presents to the ED for concern of back and leg pain, this involves an extensive number of treatment options, and is a complaint that carries with it a high risk of complications and morbidity.  I considered the following differential and admission for this  potentially life threatening condition.  The differential diagnosis includes sciatica, muscle strain  MDM:    This is a 48 year old female who presents with concern for back and leg pain.  She is nontoxic and vital  signs are reassuring.  No signs or symptoms of cauda equina.  No red flags.  Positive left straight leg raise.  Exam initially limited secondary to pain.  Patient given Decadron , Flexeril , and morphine .  Of note upon coming into the emergency department she was noted to be hypoglycemic.  She is awake alert and otherwise asymptomatic.  Patient reports that she has been taking her diabetes medications as directed.  She sometimes has episodes of hypoglycemia.  She is given 1 dose of D50 but subsequently did not require any further interventions.  Her CMP did come back with a blood sugar of 39 but this was taken prior to interventions.  She is tolerating foods and is awake and alert.  After intervention, patient is also ambulatory.  X-ray showed no evidence of fracture.  Discussed with patient that she likely has a pinched nerve.  Will have her follow-up with neurosurgery.  Decadron  should take effect in the next 12 hours.  Additionally we discussed with her that she needs to monitor her blood sugars closely and continue to eat frequent small meals.   (Labs, imaging, consults)  Labs: I Ordered, and personally interpreted labs.  The pertinent results include:  CBG, CBC, BMP  Imaging Studies ordered: I ordered imaging studies including lumbar x-ray I independently visualized and interpreted imaging. I agree with the radiologist interpretation  Additional history obtained from chart review.  External records from outside source obtained and reviewed including prior evaluations  Cardiac Monitoring: The patient was maintained on a cardiac monitor.  If on the cardiac monitor, I personally viewed and interpreted the cardiac monitored which showed an underlying rhythm of: Sinus  Reevaluation: After the interventions noted above, I reevaluated the patient and found that they have :improved  Social Determinants of Health:  lives independently  Disposition: Discharge  Co morbidities that complicate the  patient evaluation  Past Medical History:  Diagnosis Date   Abnormal uterine bleeding (AUB)    Arthritis    knees, hands   Atypical chest pain    cardiology--- dr elmira did cardiac cath 08-30-2021 showed minimal luminal irregularity involving LAD all other coronaries w/ normal  flow   Chronic diastolic (congestive) heart failure [facility])    cardiologist---- dr elmira;  preserved ef   Chronic iron  deficiency anemia    Chronic pain syndrome    followed by pain management--- dr lorilee   CKD (chronic kidney disease), stage III (HCC)    Diabetic gastroparesis (HCC)    Diabetic peripheral neuropathy (HCC)    Fibromyalgia    GAD (generalized anxiety disorder)    Generalized abdominal pain    GERD (gastroesophageal reflux disease)    History of acute renal failure 03/21/2023   admission in epic due to N/V/D due ot severe sepsis POA due to UTI   History of chronic gastritis    inflammatory   History of diabetic ketoacidosis    multiple admission's last 3 in epic 06/ 2022;  01/ 2022;   09/ 2021   History of seizure 01/2016   hypoglycemic seizure   History of vertebral compression fracture 12/2020   T11 -- T12 & L1   Hyperlipidemia    Hypertension    followed by pcp   Insulin  dependent type 2 diabetes mellitus (HCC)    uncontrolled,  followed by pcp   Irritable bowel syndrome with constipation    MDD (major depressive disorder)    Moderate COPD (chronic obstructive pulmonary disease) (HCC)    pulmology--- dr brenna   Moderate persistent asthma    Sickle cell trait    Vitamin D  deficiency 10/2019   Wears glasses      Medicines Meds ordered this encounter  Medications   dextrose  (GLUTOSE) oral gel 40% (peds > 20kg and adults)   dextrose  50 % solution 50 mL   dexamethasone  (DECADRON ) injection 10 mg   morphine  (PF) 4 MG/ML injection 4 mg   cyclobenzaprine  (FLEXERIL ) tablet 5 mg    I have reviewed the patients home medicines and have made adjustments as needed  Problem  List / ED Course: Problem List Items Addressed This Visit   None Visit Diagnoses       Sciatica of left side    -  Primary   Relevant Medications   cyclobenzaprine  (FLEXERIL ) tablet 5 mg (Completed)                Final diagnoses:  Sciatica of left side    ED Discharge Orders     None          Bari Charmaine FALCON, MD 08/01/24 [phone]

## 2024-07-31 NOTE — ED Notes (Signed)
 Patient transported to X-ray

## 2024-07-31 NOTE — ED Notes (Signed)
 Gave pt orange juice and peanut butter crackers

## 2024-07-31 NOTE — ED Triage Notes (Signed)
 Pt states that she is having pain in her back that shoots down her L leg that has been going on for the past two weeks, and pt is also c/o of pain on the whole R side of her body from her neck to feet for 2 weeks as well, denies injury. Pt reports taking oxycodone  at home that is not helping.

## 2024-07-31 NOTE — Telephone Encounter (Signed)
 Patient saw you this week and she said you changed her tizanidine  and she wants to go back to what you prescribed before.

## 2024-08-01 ENCOUNTER — Other Ambulatory Visit (HOSPITAL_BASED_OUTPATIENT_CLINIC_OR_DEPARTMENT_OTHER): Payer: Self-pay

## 2024-08-01 LAB — HCG, SERUM, QUALITATIVE: Preg, Serum: NEGATIVE

## 2024-08-01 LAB — BASIC METABOLIC PANEL WITH GFR
Anion gap: 11 (ref 5–15)
BUN: 9 mg/dL (ref 6–20)
CO2: 26 mmol/L (ref 22–32)
Calcium: 10.5 mg/dL — ABNORMAL HIGH (ref 8.9–10.3)
Chloride: 104 mmol/L (ref 98–111)
Creatinine, Ser: 1.15 mg/dL — ABNORMAL HIGH (ref 0.44–1.00)
GFR, Estimated: 59 mL/min — ABNORMAL LOW (ref >60)
Glucose, Bld: 43 mg/dL — CL (ref 70–99)
Potassium: 3.6 mmol/L (ref 3.5–5.1)
Sodium: 140 mmol/L (ref 135–145)

## 2024-08-01 LAB — CBG MONITORING, ED: Glucose-Capillary: 75 mg/dL (ref 70–99)

## 2024-08-01 NOTE — Discharge Instructions (Signed)
 You were seen today for back and leg pain.  This is likely related to a pinched nerve.  You were given a dose of steroids.  This will take effect in the next 8 to 12 hours and should give some relief.  Otherwise, continue your pain medication at home and follow-up with neurosurgery.

## 2024-08-01 NOTE — ED Notes (Addendum)
 Ambulates to restroom without incident. Has ride home and help getting into house. Tolerates 12oz juice.

## 2024-08-04 ENCOUNTER — Other Ambulatory Visit: Payer: Self-pay

## 2024-08-04 ENCOUNTER — Encounter: Admitting: Physical Medicine and Rehabilitation

## 2024-08-04 DIAGNOSIS — E669 Obesity, unspecified: Secondary | ICD-10-CM

## 2024-08-04 DIAGNOSIS — M5416 Radiculopathy, lumbar region: Secondary | ICD-10-CM | POA: Diagnosis not present

## 2024-08-04 DIAGNOSIS — G894 Chronic pain syndrome: Secondary | ICD-10-CM

## 2024-08-04 DIAGNOSIS — G4700 Insomnia, unspecified: Secondary | ICD-10-CM

## 2024-08-04 DIAGNOSIS — M25562 Pain in left knee: Secondary | ICD-10-CM | POA: Diagnosis not present

## 2024-08-04 DIAGNOSIS — M25561 Pain in right knee: Secondary | ICD-10-CM | POA: Diagnosis not present

## 2024-08-04 DIAGNOSIS — M1710 Unilateral primary osteoarthritis, unspecified knee: Secondary | ICD-10-CM

## 2024-08-04 DIAGNOSIS — Z6841 Body Mass Index (BMI) 40.0 and over, adult: Secondary | ICD-10-CM

## 2024-08-04 DIAGNOSIS — M797 Fibromyalgia: Secondary | ICD-10-CM

## 2024-08-04 DIAGNOSIS — M25511 Pain in right shoulder: Secondary | ICD-10-CM

## 2024-08-04 DIAGNOSIS — K219 Gastro-esophageal reflux disease without esophagitis: Secondary | ICD-10-CM

## 2024-08-04 DIAGNOSIS — M79601 Pain in right arm: Secondary | ICD-10-CM

## 2024-08-04 MED ORDER — TIZANIDINE HCL 6 MG PO CAPS
6.0000 mg | ORAL_CAPSULE | Freq: Three times a day (TID) | ORAL | 3 refills | Status: AC
  Filled 2024-08-04: qty 90, 30d supply, fill #0
  Filled 2024-09-02: qty 90, 30d supply, fill #1
  Filled 2024-09-29: qty 90, 30d supply, fill #2
  Filled 2024-09-29 – 2024-09-30 (×2): qty 90, 30d supply, fill #0
  Filled 2024-10-28: qty 90, 30d supply, fill #1
  Filled ????-??-??: fill #1

## 2024-08-04 MED ORDER — PREDNISONE 10 MG (21) PO TBPK
ORAL_TABLET | ORAL | 0 refills | Status: DC
  Filled 2024-08-04: qty 21, 6d supply, fill #0

## 2024-08-04 NOTE — Progress Notes (Signed)
 Subjective:    Patient ID: Jill Shaw, female    DOB: Jul 03, 1976, 48 y.o.   MRN: [id number]   HPI:   An audio/video tele-health visit is felt to be the most appropriate encounter for this patient at this time. This is a follow up tele-visit via phone. The patient is at home. MD is at office. Prior to scheduling this appointment, our staff discussed the limitations of evaluation and management by telemedicine and the availability of in-person appointments. The patient expressed understanding and agreed to proceed.    Jill Shaw is a 48 y.o. female who returns for f/u of inflammatory gastritis, chronic pain, diabetic peripheral neuropathy, and insomnia.   1) Insomnia: -She continues to experience insomnia at night. Asks about Ambien  and I discussed that this is an addictive medication and there are other safer options we can try first that can also help with her pain.  -She is currently not taking either of these medications, or Amitriptyline  or Trazodone .  -She takes Requip  for resltless legs but this does not help. -She is still sleeping very poorly -She does use screens before bed time  2) Back pain secondary to lumbar radiculitis.  -she has been having a hard time finding another pain management clinic -she says she did not mean to request an early refill today, that she said this wrong, she was trying to explain to [place] -she has received 2 warnings for marijuana in her urine sample as well as for not bringing the number of correct pills to her appointments -she asks if tizanidine  can be taken more than 4 times per day -She states her pain is located in her lower back radiating into her bilateral lower extremities and bilateral knee pain. She denies falling. She rates her pain 9. Her current exercise regime is walking.  -She was hospitalized for serotonin syndrome after use of Savella  and Cymbalta .  -Back pain has been severe -She requests a renewal of her handicap placard as  she is unable to walk 200 feet without stopping to rest.  -Her pain has been better controlled with Norco -she would like to get another XR given the chronicity of her pain.  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 -she is concerned that 30 days will end Saturday and she is not able to pick up her oxycodone  until Monday as per pharmacy  3) Diabetic peripheral neuropathy -she would like to try spinal cord stimulator and increase in lyrica  dose -she would like to try tens unit  -trying magnesium  -Qutenza  gives her 1 week of relief -has been having a hard time weaning off her medications -she is not getting enough relief from tylenol  #3 -she needs refill of her oxycodone  -she is currently very limited in her mobility and needs her son's assistance 24/7 at home, she needs a not indicating this for her son to give at work. -she asks whether Qutenza  can be administered more frequently than every 3 months, it has been helping a lot.  -she finds great benefits in her legs and found benefit in her hands as well but she kept forgetting and touching her face and this caused her face to birn -she is visiting her mother who has a pneumonia and asks whether her medications can be filled early -she asks whether her oxycodone  can be released early as she is going on vacation next week.  -She  tolerated Qutenza  well and this provided 2 weeks of good relief, benefit started right away.  -Her peripheral neuropathy is worse in her bilateral lower extremities, especially the dorsum and soles of both feet, including the toes.  -now present in her hands as well- she has been dropping objects due to her neuropathy- present on the dorsal and ventral aspects of her hands -she is interested in following with a dietician -she is interested in medicines for her diabetes -she is trying to lose weight  -needs handicap placard  4) Right  shoulder and arm pain -notes limited range of motion in her shoulder -does have pain radiating into right arm from neck and arm feels heavy at times  5) Inflammatory gastritis -has been back and forth to the hospital as this has been really hurting her. She was given pain medication in the hospital. She was given medication for emesis.  -flexeril  is making her sicker.  --patient says she has recently been diagnosed with this condition and pain has been severe. -she has her gastroenterologist recommended that she discuss with us  increasing her Percocet to better control her pain -she is currently prescribed 3 Percocet per day but feels she would benefit from up to 5 per day  6) GERD -she can have severe reflex at times -she was started on pepcid  for a short time period to help with this.   7) Morbid obesity -BMI 47.03 -weight is 274 lbs -she lose 40 lbs when she was sick since she could not tolerate food but she has gained a lot of this back -she asks about supplement she can take for weight loss  8) Migraines: -she has been having headaches and asks if she can get a scan of her head.   9) Diabetic gastroparesis: -this has been severe and she was recommended by ED physician to decrease her opioid usage, but she is nervous to do so because she is in so much pain -she asks about trying tylenol  with codeine   10) Postoperative pain: -pain has been really intense -she is not receiving any postoperative pain medications -she has been in a lot of pain with her wean  -her surgeon will not prescribed medications since she goes to pain management clinic  11) Leg pain: -was not relieved by Qutenza  so she went to the ED for her pain -she asked her provider at [facility] to increase the dose of her opioids and they told her no but she is not sure why -she asks if tizanidine  can be increased back to 6mg  TID, she is aware regarding her diagnosed of prolonged qTc interval and is  willing to accept the risks of this medication  Pain Inventory Average Pain 9 Pain Right Now 9 My pain is sharp, burning, tingling and aching  In the last 24 hours, has pain interfered with the following? General activity 0 Relation with others 0 Enjoyment of life 5 What TIME of day is your pain at its worst? morning , daytime, evening and night Sleep (in general) Poor  Pain is worse with: walking, bending, sitting, inactivity, standing and some activites Pain improves with: heat/ice and medication Relief from Meds: 10  Family History  Problem Relation Age of Onset   Diabetes Mother    Hypertension Mother    Colon cancer Maternal Grandfather    Migraines Paternal Grandfather    Colon cancer Maternal Aunt    Breast cancer Maternal Aunt    Esophageal cancer Neg Hx    Stomach  cancer Neg Hx    Rectal cancer Neg Hx    Social History   Socioeconomic History   Marital status: Divorced    Spouse name: Not on file   Number of children: 3   Years of education: Not on file   Highest education level: Not on file  Occupational History   Occupation: unemployed  Tobacco Use   Smoking status: Some Days    Current packs/day: 0.00    Average packs/day: 0.3 packs/day for 26.0 years (6.5 ttl pk-yrs)    Types: Cigarettes    Start date: 09/13/1994    Last attempt to quit: 09/13/2020    Years since quitting: 3.8   Smokeless tobacco: Never   Tobacco comments:    Pt smokes 1/2 ppd. AB. CMA 09-10-23  Vaping Use   Vaping status: Never Used  Substance and Sexual Activity   Alcohol use: No   Drug use: No   Sexual activity: Not Currently    Birth control/protection: I.U.D.  Other Topics Concern   Not on file  Social History Narrative   Right Handed   Lives in a one story apartment, but lives on the second floor   Drinks caffeine  once in awhile   Social Drivers of Health   Financial Resource Strain: Low Risk  (03/18/2023)   Overall Financial Resource Strain (CARDIA)    Difficulty  of Paying Living Expenses: Not very hard  Food Insecurity: No Food Insecurity (03/27/2024)   Hunger Vital Sign    Worried About Running Out of Food in the Last Year: Never true    Ran Out of Food in the Last Year: Never true  Transportation Needs: No Transportation Needs (03/27/2024)   PRAPARE - Administrator, Civil Service (Medical): No    Lack of Transportation (Non-Medical): No  Physical Activity: Inactive (03/18/2023)   Exercise Vital Sign    Days of Exercise per Week: 0 days    Minutes of Exercise per Session: 0 min  Stress: Stress Concern Present (03/18/2023)   [employer] of Occupational Health - Occupational Stress Questionnaire    Feeling of Stress : To some extent  Social Connections: Moderately Isolated (02/04/2024)   Social Connection and Isolation Panel    Frequency of Communication with Friends and Family: More than three times a week    Frequency of Social Gatherings with Friends and Family: Three times a week    Attends Religious Services: 1 to 4 times per year    Active Member of Clubs or Organizations: No    Attends Banker Meetings: Never    Marital Status: Separated   Past Surgical History:  Procedure Laterality Date   CATARACT EXTRACTION W/ INTRAOCULAR LENS IMPLANT Left    CESAREAN SECTION  2001   for twins   DILATION AND CURETTAGE OF UTERUS N/A 08/20/2019   Procedure: DILATATION AND CURETTAGE;  Surgeon: Starla Harland BROCKS, MD;  Location: [facility];  Service: Gynecology;  Laterality: N/A;   DILATION AND CURETTAGE OF UTERUS  05/01/2023   Procedure: DILATATION AND CURETTAGE;  Surgeon: Jeralyn Crutch, MD;  Location: [facility];  Service: Gynecology;;   ENDOMETRIAL ABLATION N/A 08/20/2019   Procedure: Lela Ablation;  Surgeon: Starla Harland BROCKS, MD;  Location: [facility];  Service: Gynecology;  Laterality: N/A;   EYE SURGERY Bilateral    laser right and cataract removed left eye   HYSTEROSCOPY N/A 05/01/2023   Procedure: HYSTEROSCOPY;   Surgeon: Jeralyn Crutch, MD;  Location: [facility];  Service: Gynecology;  Laterality: N/A;   INTRAUTERINE DEVICE (IUD) INSERTION N/A 05/01/2023   Procedure: INTRAUTERINE DEVICE (IUD) INSERTION;  Surgeon: Jeralyn Crutch, MD;  Location: [facility];  Service: Gynecology;  Laterality: N/A;   LEFT HEART CATH AND CORONARY ANGIOGRAPHY N/A 08/30/2021   Procedure: LEFT HEART CATH AND CORONARY ANGIOGRAPHY;  Surgeon: Elmira Newman PARAS, MD;  [facility];  Service: Cardiovascular;  Laterality: N/A;   RADIOLOGY WITH ANESTHESIA N/A 09/16/2019   Procedure: MRI WITH ANESTHESIA   L SPINE WITHOUT CONTRAST, T SPINE WITHOUT CONTRAST , CERVICAL WITHOUT CONTRAST;  Surgeon: Radiologist, Medication, MD;  Location: [facility];  Service: Radiology;  Laterality: N/A;   TOOTH EXTRACTION N/A 01/14/2024   Procedure: DENTAL RESTORATION/EXTRACTIONS;  Surgeon: Sheryle Hamilton, DMD;  Location: [facility];  Service: Oral Surgery;  Laterality: N/A;   TUBAL LIGATION     interval BTL   UPPER GI ENDOSCOPY  07/2017   Past Surgical History:  Procedure Laterality Date   CATARACT EXTRACTION W/ INTRAOCULAR LENS IMPLANT Left    CESAREAN SECTION  2001   for twins   DILATION AND CURETTAGE OF UTERUS N/A 08/20/2019   Procedure: DILATATION AND CURETTAGE;  Surgeon: Starla Harland BROCKS, MD;  [facility];  Service: Gynecology;  Laterality: N/A;   DILATION AND CURETTAGE OF UTERUS  05/01/2023   Procedure: DILATATION AND CURETTAGE;  Surgeon: Jeralyn Crutch, MD;  Location: [facility];  Service: Gynecology;;   ENDOMETRIAL ABLATION N/A 08/20/2019   Procedure: Lela Ablation;  Surgeon: Starla Harland BROCKS, MD;  [facility];  Service: Gynecology;  Laterality: N/A;   EYE SURGERY Bilateral    laser right and cataract removed left eye   HYSTEROSCOPY N/A 05/01/2023   Procedure: HYSTEROSCOPY;  Surgeon: Jeralyn Crutch, MD;  Location: [facility];  Service: Gynecology;   Laterality: N/A;   INTRAUTERINE DEVICE (IUD) INSERTION N/A 05/01/2023   Procedure: INTRAUTERINE DEVICE (IUD) INSERTION;  Surgeon: Jeralyn Crutch, MD;  Location: [facility];  Service: Gynecology;  Laterality: N/A;   LEFT HEART CATH AND CORONARY ANGIOGRAPHY N/A 08/30/2021   Procedure: LEFT HEART CATH AND CORONARY ANGIOGRAPHY;  Surgeon: Elmira Newman PARAS, MD;  [facility];  Service: Cardiovascular;  Laterality: N/A;   RADIOLOGY WITH ANESTHESIA N/A 09/16/2019   Procedure: MRI WITH ANESTHESIA   L SPINE WITHOUT CONTRAST, T SPINE WITHOUT CONTRAST , CERVICAL WITHOUT CONTRAST;  Surgeon: Radiologist, Medication, MD;  [facility];  Service: Radiology;  Laterality: N/A;   TOOTH EXTRACTION N/A 01/14/2024   Procedure: DENTAL RESTORATION/EXTRACTIONS;  Surgeon: Sheryle Hamilton, DMD;  [facility];  Service: Oral Surgery;  Laterality: N/A;   TUBAL LIGATION     interval BTL   UPPER GI ENDOSCOPY  07/2017   Past Medical History:  Diagnosis Date   Abnormal uterine bleeding (AUB)    Arthritis    knees, hands   Atypical chest pain    cardiology--- dr elmira did cardiac cath 08-30-2021 showed minimal luminal irregularity involving LAD all other coronaries w/ normal  flow   Chronic diastolic (congestive) heart failure [facility])    cardiologist---- dr elmira;  preserved ef   Chronic iron  deficiency anemia    Chronic pain syndrome    followed by pain management--- dr lorilee   CKD (chronic kidney disease), stage III (HCC)    Diabetic gastroparesis (HCC)    Diabetic peripheral neuropathy (HCC)    Fibromyalgia    GAD (generalized anxiety disorder)    Generalized abdominal  pain    GERD (gastroesophageal reflux disease)    History of acute renal failure 03/21/2023   admission in epic due to N/V/D due ot severe sepsis POA due to UTI   History of chronic gastritis    inflammatory   History of diabetic ketoacidosis    multiple admission's last 3 in epic 06/ 2022;   01/ 2022;   09/ 2021   History of seizure 01/2016   hypoglycemic seizure   History of vertebral compression fracture 12/2020   T11 -- T12 & L1   Hyperlipidemia    Hypertension    followed by pcp   Insulin  dependent type 2 diabetes mellitus (HCC)    uncontrolled,  followed by pcp   Irritable bowel syndrome with constipation    MDD (major depressive disorder)    Moderate COPD (chronic obstructive pulmonary disease) (HCC)    pulmology--- dr brenna   Moderate persistent asthma    Sickle cell trait    Vitamin D  deficiency 10/2019   Wears glasses    There were no vitals taken for this visit.  Opioid Risk Score:   Fall Risk Score:  `1  Depression screen PHQ 2/9     07/29/2024    1:45 PM 02/14/2024   10:31 AM 12/21/2023   11:30 AM 07/26/2023   10:57 AM 05/18/2023    1:47 PM 04/23/2023   11:51 AM 04/03/2023    2:33 PM  Depression screen PHQ 2/9  Decreased Interest  0 0 0 0 0 2  Down, Depressed, Hopeless  0 0 0 0 0 2  PHQ - 2 Score  0 0 0 0 0 4  Altered sleeping       2  Tired, decreased energy       2  Change in appetite       2  Feeling bad or failure about yourself        0  Trouble concentrating       2  Moving slowly or fidgety/restless       0  Suicidal thoughts       0  PHQ-9 Score       12     Information is confidential and restricted. Go to Review Flowsheets to unlock data.     Review of Systems  Constitutional:  Positive for diaphoresis and unexpected weight change.  Respiratory:  Positive for shortness of breath and wheezing.   Gastrointestinal:  Positive for abdominal pain, constipation and nausea.  Musculoskeletal:  Positive for arthralgias, back pain, gait problem and myalgias.  Neurological:  Positive for dizziness, weakness and numbness.  Hematological:  Bruises/bleeds easily.  Psychiatric/Behavioral:  Positive for confusion and decreased concentration. The patient is nervous/anxious.   All other systems reviewed and are negative.      Objective:  PRIOR  EXAM Gen: no distress, normal appearing HEENT: oral mucosa pink and moist, NCAT Cardio: Reg rate Chest: normal effort, normal rate of breathing Abd: soft, non-distended Ext: no edema Psych: pleasant, normal affect Skin: intact no lesions on feet Neuro: Alert and oriented x3, decreased sensation of bilateral feet     Assessment & Plan:  1. Lumbar Radiculitis: Continue current medication regimen. Continue HEP as Tolerated. Provided with handicap placard. XR ordered. Note for work provided via Clinical cytogeneticist.  -Topamax  prescribed, discussed this can help with pain, insomnia, and weight loss, discussed that it calms the nervous system similary to lyrica  and gabapentin but whereas these medications can cause weight gain, topamax  curbs appetite and usually  causes weight loss. -discussed that she has been having a hard time finding another pain clinic -discussed that we are no longer able to prescribed controlled medications for her given her failure to comply with her pain contract with us   2. Fibromyalgia: Continue HEP as Tolerated. Continue current Medication regimen. Continue to Monitor.  3. Bilateral Knee Pain: Right knee is currently worst, XR ordered and will call with results.  Continue to Monitor.  4. Chronic Pain Syndrome: -provided note explaining her inability to work due to her pain -discussed with patient when she last picked up oxycodone  and when she is due to pick up next refill, called pharmacy to confirm  5. Insomnia: -Try to go outside near sunrise -Get exercise during the day.  -Discussed good sleep hygiene: turning off all devices an hour before bedtime.  -Chamomile tea with dinner.  -Melatonin did for help. -warm bath or shower before bed. -apply lavender oil for forehead at night  6. Diabetic peripheral neuropathy -provided note for son to give to work to indicate that patient needs his assistance 24/7 due to her mobility deficits -checked with Qutenza  rep and  medicaid/medicare will not cover Qutenza  more frequently than every 3 months  -refilled tizanidine   -discussed her response to Qutenza , discussed that it provides 1 week benefit, discussed spinal cord stimulator and increasing dose of Lyrica  and she is agreeable to both options  -Discussed Qutenza  as an option for neuropathic pain control. Discussed that this is a capsaicin  patch, stronger than capsaicin  cream. Discussed that it is currently approved for diabetic peripheral neuropathy and post-herpetic neuralgia, but that it has also shown benefit in treating other forms of neuropathy. Provided patient with link to site to learn more about the patch: https://www.qutenza .com/. Discussed that the patch would be placed in office and benefits usually last 3 months. Discussed that unintended exposure to capsaicin  can cause severe irritation of eyes, mucous membranes, respiratory tract, and skin, but that Qutenza  is a local treatment and does not have the systemic side effects of other nerve medications. Discussed that there may be pain, itching, erythema, and decreased sensory function associated with the application of Qutenza . Side effects usually subside within 1 week. A cold pack of analgesic medications can help with these side effects. Blood pressure can also be increased due to pain associated with administration of the patch.   4 patches of Qutenza  was applied to the area of pain. Ice packs were applied during the procedure to ensure patient comfort. Blood pressure was monitored every 15 minutes. The patient tolerated the procedure well. Post-procedure instructions were given and follow-up has been scheduled.    -discussed positive response to last Qutenza  treatment  -switch from tylenol  #3 to tylenol  #4 TID PRN given lack of relief from former  -discussed benefit from TENS unit in office  Prescribed Zynex Nexwave  7. Right sided shoulder and arm pain -discussed that could be a component or  frozen shoulder and/or cervical radiculitis -will obtain right shoulder XR and cervical spine XR and call when results are available  8. Obesity: -Educated that current weight is 274 lbs and current BMI is 47.03 -shared foods that can assist in weight loss: 1) leafy greens- high in fiber and nutrients 2) dark chocolate- improves metabolism (if prefer sweetened, best to sweeten with honey instead of sugar).  3) cruciferous vegetables- high in fiber and protein 4) full fat yogurt: high in healthy fat, protein, calcium , and probiotics 5) apples- high in a variety of phytochemicals 6)  nuts- high in fiber and protein that increase feelings of fullness 7) grapefruit: rich in nutrients, antioxidants, and fiber (not to be taken with anticoagulation) 8) beans- high in protein and fiber 9) salmon- has high quality protein and healthy fats 10) green tea- rich in polyphenols 11) eggs- rich in choline and vitamin D  12) tuna- high protein, boosts metabolism 13) avocado- decreases visceral abdominal fat 14) chicken (pasture raised): high in protein and iron  15) blueberries- reduce abdominal fat and cholesterol 16) whole grains- decreases calories retained during digestion, speeds metabolism 17) chia seeds- curb appetite 18) chilies- increases fat metabolism  -Discussed supplements that can be used:  1) Metatrim 400mg  BID 30 minutes before breakfast and dinner  2) Sphaeranthus indicus and Garcinia mangostana (combinations of these and #1 can be found in capsicum and zychrome  3) green coffee bean extract 400mg  twice per day or Irvingia (african mango) 150 to 300mg  twice per day.    9) Gastroesophageal reflux disease  -discussed the interaction between Tizanidine  and Pepcid  and that their additive effects could cause hypotension and bradycardia. -recommended eating small meals -recommended avoiding acidic and spicy foods -recommended apple cider vinegar in a cup of water before meals.   10)  Inflammatory gastritis: -recommended following up with GI  11) Migraines: -Head CT ordered  12) Diabetic gastroparesis: -wean Percocet to 5 tabs per day -add tylenol  with codeine  TID to replace this, assist with wean -discussed that this has been severe  13) Leg pain: -discussed her leg pain, that she went to the ED and received pain medication, that she requests tizanidine  to be increased, discussed risks with her prolonged qt interval and she is willing to accept risk  7 minutes spent in discussion of her leg pain, that she went to the ED and received pain medication, that she requests tizanidine  to be increased, discussed risks with her prolonged qt interval and she is willing to accept risk

## 2024-08-06 ENCOUNTER — Ambulatory Visit (INDEPENDENT_AMBULATORY_CARE_PROVIDER_SITE_OTHER): Admitting: Endocrinology

## 2024-08-06 ENCOUNTER — Other Ambulatory Visit: Payer: Self-pay

## 2024-08-06 ENCOUNTER — Encounter: Payer: Self-pay | Admitting: Endocrinology

## 2024-08-06 ENCOUNTER — Ambulatory Visit: Payer: Self-pay | Admitting: Endocrinology

## 2024-08-06 VITALS — BP 126/70 | HR 83 | Resp 20 | Ht 64.0 in | Wt 295.6 lb

## 2024-08-06 DIAGNOSIS — Z794 Long term (current) use of insulin: Secondary | ICD-10-CM

## 2024-08-06 DIAGNOSIS — E1165 Type 2 diabetes mellitus with hyperglycemia: Secondary | ICD-10-CM

## 2024-08-06 LAB — BASIC METABOLIC PANEL WITH GFR
BUN/Creatinine Ratio: 12 (calc) (ref 6–22)
BUN: 15 mg/dL (ref 7–25)
CO2: 22 mmol/L (ref 20–32)
Calcium: 10.3 mg/dL — ABNORMAL HIGH (ref 8.6–10.2)
Chloride: 109 mmol/L (ref 98–110)
Creat: 1.26 mg/dL — ABNORMAL HIGH (ref 0.50–0.99)
Glucose, Bld: 231 mg/dL — ABNORMAL HIGH (ref 65–99)
Potassium: 4.2 mmol/L (ref 3.5–5.3)
Sodium: 141 mmol/L (ref 135–146)
eGFR: 53 mL/min/1.73m2 — ABNORMAL LOW (ref >=60)

## 2024-08-06 LAB — POCT GLYCOSYLATED HEMOGLOBIN (HGB A1C): Hemoglobin A1C: 7.4 % — AB (ref 4.0–5.6)

## 2024-08-06 MED ORDER — GVOKE HYPOPEN 2-PACK 1 MG/0.2ML ~~LOC~~ SOAJ
1.0000 mg | SUBCUTANEOUS | 1 refills | Status: AC | PRN
  Filled 2024-08-06: qty 0.4, 30d supply, fill #0
  Filled 2024-10-16: qty 0.4, 30d supply, fill #1

## 2024-08-06 MED ORDER — EMPAGLIFLOZIN 25 MG PO TABS
25.0000 mg | ORAL_TABLET | Freq: Every day | ORAL | 3 refills | Status: AC
  Filled 2024-08-06: qty 90, 90d supply, fill #0
  Filled 2024-11-12: qty 90, 90d supply, fill #1

## 2024-08-06 MED ORDER — FREESTYLE LIBRE 3 PLUS SENSOR MISC
1.0000 | 3 refills | Status: AC
  Filled 2024-08-06: qty 2, 30d supply, fill #0
  Filled 2024-10-03: qty 2, 30d supply, fill #1

## 2024-08-06 MED ORDER — INSULIN LISPRO (1 UNIT DIAL) 100 UNIT/ML (KWIKPEN)
30.0000 [IU] | PEN_INJECTOR | Freq: Three times a day (TID) | SUBCUTANEOUS | 5 refills | Status: AC
  Filled 2024-08-06 – 2024-08-11 (×2): qty 36, 40d supply, fill #0
  Filled 2024-08-19 (×2): qty 36, 30d supply, fill #0
  Filled 2024-08-19: qty 36, 40d supply, fill #0
  Filled 2024-09-22: qty 36, 30d supply, fill #1
  Filled 2024-09-23 (×3): qty 36, 30d supply, fill #0
  Filled 2024-10-20: qty 36, 30d supply, fill #1

## 2024-08-06 MED ORDER — TOUJEO MAX SOLOSTAR 300 UNIT/ML ~~LOC~~ SOPN
70.0000 [IU] | PEN_INJECTOR | Freq: Every day | SUBCUTANEOUS | 5 refills | Status: AC
  Filled 2024-08-06 – 2024-08-12 (×3): qty 18, 77d supply, fill #0
  Filled 2024-10-16 – 2024-10-27 (×2): qty 18, 77d supply, fill #1

## 2024-08-06 MED ORDER — TIRZEPATIDE 7.5 MG/0.5ML ~~LOC~~ SOAJ
7.5000 mg | SUBCUTANEOUS | 4 refills | Status: DC
  Filled 2024-08-06: qty 2, 28d supply, fill #0
  Filled 2024-08-23: qty 6, 84d supply, fill #0

## 2024-08-06 NOTE — Progress Notes (Signed)
 Outpatient Endocrinology Note [place] Casy Tavano, MD   Patient's Name: Jill Shaw    DOB: September 03, 1976    MRN: [id number]                                                    REASON OF VISIT: New consult for type 2 diabetes mellitus  REFERRING PROVIDER: Oley Bascom RAMAN, NP  PCP: Oley Bascom RAMAN, NP  HISTORY OF PRESENT ILLNESS:   Jill Shaw is a 48 y.o. old female with past medical history listed below, is here for new consult for type 2 diabetes mellitus.   Pertinent Diabetes History: Patient is referred to endocrinology for evaluation and management of uncontrolled type 2 diabetes mellitus.  Initial consult on October 2012 2025.  Patient was previously seen in this clinic by Dr. Sam in 2022.  Patient was diagnosed with type 2 diabetes mellitus in 2008.  Patient mostly has hemoglobin A1c 8-10 percent range with uncontrolled type 2 diabetes mellitus.   History of DKA or diabetes related hospitalizations: Due to hypoglycemia.  No DKA.  Previous diabetes education: Yes   No personal history of pancreatitis and / or family history of medullary thyroid  carcinoma or MEN 2B syndrome.   Chronic Diabetes Complications : Retinopathy: yes. Last ophthalmology exam was done on annually, reportedly, following with ophthalmology regularly.  Nephropathy: Microalbuminuria present, on ACE/ARB /losartan  and Jardiance . Peripheral neuropathy: yes Coronary artery disease: no.  She has CHF. Stroke: no  Relevant comorbidities and cardiovascular risk factors: Obesity: yes Body mass index is 50.74 kg/m.  Hypertension: Yes  Hyperlipidemia : Yes, on statin  Current / Home Diabetic regimen includes:  Mounjaro  5 mg weekly.  Lantus  80 units at bedtime. Humalog  40 units with meals 3 times a day. Jardiance  10 mg daily.  Metformin  ER 1000 mg two times a day.   Prior diabetic medications: Onglyza , Victoza, insulin  mix, Farxiga  in the past.  Mounjaro  was restarted in August  2025.  Glycemic data:    She reports she has been monitoring blood sugar at home with glucometer, reports blood sugar 170s to 200s range lately.  She forgot to bring glucometer, no glucose data to review.  She reports she has been working with pharmacy to get freestyle libre CGM.  Hypoglycemia: Patient has hypoglycemic episodes. Patient has hypoglycemia awareness.  Patient recently had ER visit due to hypoglycemia with blood sugar of 43.  Factors modifying glucose control: 1.  Diabetic diet assessment: 3 meals a day.  2.  Staying active or exercising: No formal exercise.  3.  Medication compliance: compliant all of the time.  Interval history  Patient presented to establish diabetes care.  Diabetes regimen as reviewed and noted above.  Patient has been complaining of low back pain and joint pain, discussed about follow-up with primary care provider.  Hemoglobin A1c today 7.4% improved.  She has been taking Mounjaro  5 mg weekly since mid of September and tolerating well and denies any GI issues.  Body weight is relatively stable.  REVIEW OF SYSTEMS As per history of present illness.   PAST MEDICAL HISTORY: Past Medical History:  Diagnosis Date   Abnormal uterine bleeding (AUB)    Arthritis    knees, hands   Atypical chest pain    cardiology--- dr elmira did cardiac cath 08-30-2021 showed minimal luminal irregularity involving LAD all  other coronaries w/ normal  flow   Chronic diastolic (congestive) heart failure [facility])    cardiologist---- dr elmira;  preserved ef   Chronic iron  deficiency anemia    Chronic pain syndrome    followed by pain management--- dr lorilee   CKD (chronic kidney disease), stage III (HCC)    Diabetic gastroparesis (HCC)    Diabetic peripheral neuropathy (HCC)    Fibromyalgia    GAD (generalized anxiety disorder)    Generalized abdominal pain    GERD (gastroesophageal reflux disease)    History of acute renal failure 03/21/2023   admission in  epic due to N/V/D due ot severe sepsis POA due to UTI   History of chronic gastritis    inflammatory   History of diabetic ketoacidosis    multiple admission's last 3 in epic 06/ 2022;  01/ 2022;   09/ 2021   History of seizure 01/2016   hypoglycemic seizure   History of vertebral compression fracture 12/2020   T11 -- T12 & L1   Hyperlipidemia    Hypertension    followed by pcp   Insulin  dependent type 2 diabetes mellitus (HCC)    uncontrolled,  followed by pcp   Irritable bowel syndrome with constipation    MDD (major depressive disorder)    Moderate COPD (chronic obstructive pulmonary disease) (HCC)    pulmology--- dr brenna   Moderate persistent asthma    Sickle cell trait    Vitamin D  deficiency 10/2019   Wears glasses     PAST SURGICAL HISTORY: Past Surgical History:  Procedure Laterality Date   CATARACT EXTRACTION W/ INTRAOCULAR LENS IMPLANT Left    CESAREAN SECTION  2001   for twins   DILATION AND CURETTAGE OF UTERUS N/A 08/20/2019   Procedure: DILATATION AND CURETTAGE;  Surgeon: Starla Harland BROCKS, MD;  Location: [facility];  Service: Gynecology;  Laterality: N/A;   DILATION AND CURETTAGE OF UTERUS  05/01/2023   Procedure: DILATATION AND CURETTAGE;  Surgeon: Jeralyn Crutch, MD;  Location: [facility];  Service: Gynecology;;   ENDOMETRIAL ABLATION N/A 08/20/2019   Procedure: Lela Ablation;  Surgeon: Starla Harland BROCKS, MD;  Location: [facility];  Service: Gynecology;  Laterality: N/A;   EYE SURGERY Bilateral    laser right and cataract removed left eye   HYSTEROSCOPY N/A 05/01/2023   Procedure: HYSTEROSCOPY;  Surgeon: Jeralyn Crutch, MD;  Location: [facility];  Service: Gynecology;  Laterality: N/A;   INTRAUTERINE DEVICE (IUD) INSERTION N/A 05/01/2023   Procedure: INTRAUTERINE DEVICE (IUD) INSERTION;  Surgeon: Jeralyn Crutch, MD;  Location: [facility];  Service: Gynecology;  Laterality: N/A;   LEFT HEART CATH AND CORONARY  ANGIOGRAPHY N/A 08/30/2021   Procedure: LEFT HEART CATH AND CORONARY ANGIOGRAPHY;  Surgeon: elmira Newman PARAS, MD;  Location: [facility];  Service: Cardiovascular;  Laterality: N/A;   RADIOLOGY WITH ANESTHESIA N/A 09/16/2019   Procedure: MRI WITH ANESTHESIA   L SPINE WITHOUT CONTRAST, T SPINE WITHOUT CONTRAST , CERVICAL WITHOUT CONTRAST;  Surgeon: Radiologist, Medication, MD;  Location: [facility];  Service: Radiology;  Laterality: N/A;   TOOTH EXTRACTION N/A 01/14/2024   Procedure: DENTAL RESTORATION/EXTRACTIONS;  Surgeon: Sheryle Hamilton, DMD;  Location: [facility];  Service: Oral Surgery;  Laterality: N/A;   TUBAL LIGATION     interval BTL   UPPER GI ENDOSCOPY  07/2017    ALLERGIES: Allergies  Allergen Reactions   Elavil  [Amitriptyline ] Other (See Comments)    Coma   Orudis [Ketoprofen] Nausea And  Vomiting   Desyrel  [Trazodone ] Nausea And Vomiting   Tylenol  [Acetaminophen ] Nausea And Vomiting   Ambien  [Zolpidem  Tartrate] Nausea And Vomiting    She also reported headache   Aspirin  Nausea Only   Hydroxyzine  Hcl Rash   Motrin  [Ibuprofen ] Nausea And Vomiting   Naprosyn [Naproxen] Nausea And Vomiting   Neurontin [Gabapentin] Nausea And Vomiting and Other (See Comments)    upset stomach   Sulfa Antibiotics Nausea And Vomiting   Ultram  [Tramadol ] Nausea And Vomiting and Other (See Comments)    stomach upset   Victoza [Liraglutide] Nausea And Vomiting   Zegerid [Omeprazole -Sodium Bicarbonate ] Nausea And Vomiting    FAMILY HISTORY:  Family History  Problem Relation Age of Onset   Diabetes Mother    Hypertension Mother    Colon cancer Maternal Grandfather    Migraines Paternal Grandfather    Colon cancer Maternal Aunt    Breast cancer Maternal Aunt    Esophageal cancer Neg Hx    Stomach cancer Neg Hx    Rectal cancer Neg Hx     SOCIAL HISTORY: Social History   Socioeconomic History   Marital status: Divorced    Spouse name: Not on file   Number of children: 3   Years of  education: Not on file   Highest education level: Not on file  Occupational History   Occupation: unemployed  Tobacco Use   Smoking status: Some Days    Current packs/day: 0.00    Average packs/day: 0.3 packs/day for 26.0 years (6.5 ttl pk-yrs)    Types: Cigarettes    Start date: 09/13/1994    Last attempt to quit: 09/13/2020    Years since quitting: 3.8   Smokeless tobacco: Never   Tobacco comments:    Pt smokes 1/2 ppd. AB. CMA 09-10-23  Vaping Use   Vaping status: Never Used  Substance and Sexual Activity   Alcohol use: No   Drug use: No   Sexual activity: Not Currently    Birth control/protection: I.U.D.  Other Topics Concern   Not on file  Social History Narrative   Right Handed   Lives in a one story apartment, but lives on the second floor   Drinks caffeine  once in awhile   Social Drivers of Health   Financial Resource Strain: Low Risk  (03/18/2023)   Overall Financial Resource Strain (CARDIA)    Difficulty of Paying Living Expenses: Not very hard  Food Insecurity: No Food Insecurity (03/27/2024)   Hunger Vital Sign    Worried About Running Out of Food in the Last Year: Never true    Ran Out of Food in the Last Year: Never true  Transportation Needs: No Transportation Needs (03/27/2024)   PRAPARE - Administrator, Civil Service (Medical): No    Lack of Transportation (Non-Medical): No  Physical Activity: Inactive (03/18/2023)   Exercise Vital Sign    Days of Exercise per Week: 0 days    Minutes of Exercise per Session: 0 min  Stress: Stress Concern Present (03/18/2023)   [employer] of Occupational Health - Occupational Stress Questionnaire    Feeling of Stress : To some extent  Social Connections: Moderately Isolated (02/04/2024)   Social Connection and Isolation Panel    Frequency of Communication with Friends and Family: More than three times a week    Frequency of Social Gatherings with Friends and Family: Three times a week    Attends Religious  Services: 1 to 4 times per year    Active  Member of Clubs or Organizations: No    Attends Engineer, structural: Never    Marital Status: Separated    MEDICATIONS:  Current Outpatient Medications  Medication Sig Dispense Refill   Accu-Chek Softclix Lancets lancets Use as instructed to monitor blood sugar 4 times daily 300 each 4   albuterol  (PROVENTIL ) (2.5 MG/3ML) 0.083% nebulizer solution Take 3 mLs (2.5 mg total) by nebulization every 6 (six) hours as needed for wheezing or shortness of breath. 75 mL 2   albuterol  (VENTOLIN  HFA) 108 (90 Base) MCG/ACT inhaler Inhale 2 puffs into the lungs every 6 (six) hours as needed for wheezing or shortness of breath. 8.5 g 3   Blood Glucose Monitoring Suppl (ACCU-CHEK GUIDE) w/Device KIT Use as directed to monitor blood sugar 4 times daily (before each meal and before bed). 1 kit 0   budesonide -formoterol  (SYMBICORT ) 160-4.5 MCG/ACT inhaler Inhale 2 puffs into the lungs 2 (two) times daily. 10.2 g 5   busPIRone  (BUSPAR ) 10 MG tablet Take 1 tablet (10 mg total) by mouth 2 (two) times daily. 60 tablet 2   Continuous Glucose Sensor (FREESTYLE LIBRE 3 PLUS SENSOR) MISC Change every 15 days. 6 each 3   Dexlansoprazole  30 MG capsule DR Take 1 capsule (30 mg total) by mouth daily. NEEDS OFFICE VISIT FOR ADDITIONAL REFILLS 90 capsule 1   diclofenac  Sodium (VOLTAREN ) 1 % GEL Apply 2 to 4 gram to painful sites up to 4 times daily if needed, max daily dose: 32 Gram 300 g 1   diclofenac  Sodium (VOLTAREN ) 1 % GEL apply 2 to 4 gram to painful sites up to 4 times daily if needed, max daily dose: 32 Gram 300 g 1   diclofenac  Sodium (VOLTAREN ) 1 % GEL Apply 2 to 4 grams to painful sites up to 4 times daily if needed, max daily dose 32 grams 300 g 1   diclofenac  Sodium (VOLTAREN ) 1 % GEL Apply 2 to 4 gram to painful sites up to 4 times daily if needed, max daily dose: 32 Gram 300 g 1   diclofenac  Sodium (VOLTAREN ) 1 % GEL Apply 2 to 4 gram to painful sites up to 4  times daily if needed, max daily dose: 32 Gram 300 g 1   dicyclomine  (BENTYL ) 20 MG tablet Take 1 tablet (20 mg total) by mouth 2 (two) times daily. 60 tablet 3   empagliflozin  (JARDIANCE ) 25 MG TABS tablet Take 1 tablet (25 mg total) by mouth daily before breakfast. 90 tablet 3   ferrous sulfate  325 (65 FE) MG tablet Take 1 tablet (325 mg total) by mouth daily with breakfast. 30 tablet 0   glucose blood (ACCU-CHEK GUIDE TEST) test strip Use as instructed to monitor blood sugar 4 times daily 300 each 4   GVOKE HYPOPEN  2-PACK 1 MG/0.2ML SOAJ Inject 1 mg into the skin as needed (severe hypoglycemia with not able to take oral medication or severe alterned mental status / unconcious.). 0.4 mL 1   hydrALAZINE  (APRESOLINE ) 25 MG tablet Take 1 tablet (25 mg total) by mouth 3 (three) times daily. 90 tablet 0   Insulin  Pen Needle (PEN NEEDLES) 31G X 5 MM MISC Use as instructed to inject insulin  four times day 200 each 3   levocetirizine (XYZAL) 5 MG tablet Take 5 mg by mouth every evening.     losartan  (COZAAR ) 25 MG tablet 1 tab by mouth daily 90 tablet 3   Melatonin 10 MG TABS Take 10 mg by mouth at bedtime  as needed (insomnia). 30 tablet 0   metFORMIN  (GLUCOPHAGE -XR) 500 MG 24 hr tablet Take 2 tablets (1,000 mg total) by mouth 2 (two) times daily with a meal. 360 tablet 1   metoCLOPramide  (REGLAN ) 10 MG tablet Take 1 tablet (10 mg total) by mouth every 8 (eight) hours as needed for nausea. 20 tablet 0   metoprolol  succinate (TOPROL -XL) 100 MG 24 hr tablet Take 1 tablet (100 mg total) by mouth daily. Take with or immediately following a meal. 90 tablet 0   naloxone  (NARCAN ) nasal spray 4 mg/0.1 mL Insert 1 spray into one nostril if poorly responding / turning blue. CALL 911 ASAP 2 each 0   nitroGLYCERIN  (NITROSTAT ) 0.4 MG SL tablet Place 1 tablet (0.4 mg total) under the tongue every 5 (five) minutes as needed for chest pain. Additional refills to be filled by PCP, patient aware 25 tablet 0   norethindrone   (AYGESTIN ) 5 MG tablet Take 1 tablet (5 mg total) by mouth 2 (two) times daily as needed (bleeding). 90 tablet 8   ondansetron  (ZOFRAN -ODT) 4 MG disintegrating tablet Take 1 tablet (4 mg total) by mouth every 8 (eight) hours as needed for nausea or vomiting. 20 tablet 0   Oxycodone  HCl 10 MG TABS Take 1 tablet (10 mg total) by mouth 4 (four) times daily as needed for pain 120 tablet 0   Oxycodone  HCl 10 MG TABS Take 1 tablet (10 mg total) by mouth 4 (four) times daily if needed for pain 120 tablet 0   Oxycodone  HCl 10 MG TABS Take 1 tablet (10 mg total) by mouth 4 (four) times daily as needed for pain 06/20/24 12 tablet 0   Oxycodone  HCl 10 MG TABS Take 1 tablet (10 mg total) by mouth 4 (four) times daily as needed for pain. 120 tablet 0   potassium chloride  SA (KLOR-CON  M) 20 MEQ tablet Take 1 tablet (20 mEq total) by mouth daily. 30 tablet 0   predniSONE  (STERAPRED UNI-PAK 21 TAB) 10 MG (21) TBPK tablet Take by mouth as directed on the package for 6 days 21 tablet 0   pregabalin  (LYRICA ) 150 MG capsule Take 1 capsule (150 mg total) by mouth 3 (three) times daily. 270 capsule 3   ramelteon  (ROZEREM ) 8 MG tablet Take 1 tablet (8 mg total) by mouth at bedtime. 30 tablet 0   rosuvastatin  (CRESTOR ) 20 MG tablet Take 1 tablet (20 mg total) by mouth daily. KEEP OV. 90 tablet 0   senna (SENOKOT) 8.6 MG TABS tablet Take 1 tablet (8.6 mg total) by mouth daily. 120 tablet 0   spironolactone  (ALDACTONE ) 25 MG tablet Take 1 tablet (25 mg total) by mouth daily. 90 tablet 0   sucralfate  (CARAFATE ) 1 g tablet Take 1 tablet (1 g total) by mouth 4 (four) times daily -  with meals and at bedtime. 120 tablet 3   tirzepatide  (MOUNJARO ) 7.5 MG/0.5ML Pen Inject 7.5 mg into the skin once a week. 6 mL 4   tizanidine  (ZANAFLEX ) 6 MG capsule Take 1 capsule (6 mg total) by mouth 3 (three) times daily. 90 capsule 3   torsemide  (DEMADEX ) 20 MG tablet Take 3 tablets (60 mg total) by mouth in the morning for swelling. 270 tablet  3   Continuous Glucose Sensor (FREESTYLE LIBRE 3 PLUS SENSOR) MISC Change sensor every 15 days. (Patient not taking: Reported on 08/06/2024) 2 each 11   insulin  glargine, 2 Unit Dial , (TOUJEO  MAX SOLOSTAR) 300 UNIT/ML Solostar Pen Inject 70 Units into  the skin daily. May increase up to 100 units daily if instructed by your provider. 18 mL 5   insulin  lispro (HUMALOG  KWIKPEN) 100 UNIT/ML KwikPen Inject 30 Units into the skin with breakfast, with lunch, and with evening meal. May increase up to 40 units with each meal if instructed by your provider. 36 mL 5   No current facility-administered medications for this visit.    PHYSICAL EXAM: Vitals:   08/06/24 1312  BP: 126/70  Pulse: 83  Resp: 20  SpO2: 98%  Weight: 295 lb 9.6 oz (134.1 kg)  Height: 5' 4 (1.626 m)   Body mass index is 50.74 kg/m.  Wt Readings from Last 3 Encounters:  08/06/24 295 lb 9.6 oz (134.1 kg)  07/28/24 295 lb (133.8 kg)  05/27/24 294 lb (133.4 kg)    General: Well developed, well nourished female in no apparent distress.  HEENT: AT/[place], no external lesions.  Eyes: Conjunctiva clear and no icterus. Neck: Neck supple  Lungs: Respirations not labored Neurologic: Alert, oriented, normal speech Extremities / Skin: Dry.  Psychiatric: Does not appear depressed or anxious  Diabetic Foot Exam - Simple   No data filed     LABS Reviewed Lab Results  Component Value Date   HGBA1C 7.4 (A) 08/06/2024   HGBA1C 9.2 (H) 03/27/2024   HGBA1C 8.9 (A) 02/25/2024   No results found for: FRUCTOSAMINE Lab Results  Component Value Date   CHOL 153 12/09/2021   HDL 54 12/09/2021   LDLCALC 74 12/09/2021   TRIG 144 12/09/2021   CHOLHDL 2.8 12/09/2021   Lab Results  Component Value Date   MICRALBCREAT 145 (H) 02/25/2024   Lab Results  Component Value Date   CREATININE 1.15 (H) 07/31/2024   Lab Results  Component Value Date   GFR 60.99 09/21/2021    ASSESSMENT / PLAN  1. Uncontrolled type 2 diabetes  mellitus with hyperglycemia, with long-term current use of insulin  (HCC)   2. Type 2 diabetes mellitus with hyperglycemia, with long-term current use of insulin  (HCC)     Diabetes Mellitus type 2, complicated by diabetic neuropathy,  microalbuminuria. - Diabetic status / severity: Uncontrolled, slowly improving.  Lab Results  Component Value Date   HGBA1C 7.4 (A) 08/06/2024    - Hemoglobin A1c goal : <6.5%  Discussed about type 2 diabetes mellitus and chronic diabetic complications including diet check retinopathy, neuropathy and nephropathy.  Discussed about compliance with diet and medications.  She has improvement on diabetes control after being on Mounjaro .  She reports she gets occasional hypoglycemia, recent ER visit with hyperglycemia blood sugar 43.  She will benefit from continuous glucose monitor.  She is also on multidose insulin  regimen.  - Medications: See below.  I) increase Mounjaro  from 5 to 7.5 mg weekly. II) decrease Toujeo  from 80 to 70 units daily. III) Decrease Humalog  from 40 to 30 units with meals 3 times a day.  Humalog  can be adjusted based on meal side but no more than 30 units at a time. IV) Increase Jardiance  to 25 mg daily. V) Continue metformin  1000 mg 2 times a day.  - Home glucose testing: Advised to check 3-4 times a day before eating and at bedtime.  Sent prescription for freestyle libre 3+.  I recommend using continuous glucose monitor.  - Discussed/ Gave Hypoglycemia treatment plan.  # Consult : not required at this time.   # Annual urine for microalbuminuria/ creatinine ratio,+ microalbuminuria currently, continue ACE/ARB recent lab with elevated creatinine and hyperglycemia and  elevated serum calcium , will recheck BMP with eGFR today. Last  Lab Results  Component Value Date   MICRALBCREAT 145 (H) 02/25/2024    # Foot check nightly / neuropathy.  # Annual dilated diabetic eye exams.   - Diet: Make healthy diabetic food choices,  discussed in detail. - Life style / activity / exercise: Discussed.  2. Blood pressure  -  BP Readings from Last 1 Encounters:  08/06/24 126/70    - Control is in target.  - No change in current plans.  3. Lipid status / Hyperlipidemia - Last  Lab Results  Component Value Date   LDLCALC 74 12/09/2021   - Continue rosuvastatin  20 mg daily.  Managed by PCP/cardiology.  Diagnoses and all orders for this visit:  Uncontrolled type 2 diabetes mellitus with hyperglycemia, with long-term current use of insulin  (HCC) -     POCT glycosylated hemoglobin (Hb A1C) -     Continuous Glucose Sensor (FREESTYLE LIBRE 3 PLUS SENSOR) MISC; Change every 15 days. -     Basic metabolic panel with GFR -     insulin  lispro (HUMALOG  KWIKPEN) 100 UNIT/ML KwikPen; Inject 30 Units into the skin with breakfast, with lunch, and with evening meal. May increase up to 40 units with each meal if instructed by your provider. -     empagliflozin  (JARDIANCE ) 25 MG TABS tablet; Take 1 tablet (25 mg total) by mouth daily before breakfast. -     GVOKE HYPOPEN  2-PACK 1 MG/0.2ML SOAJ; Inject 1 mg into the skin as needed (severe hypoglycemia with not able to take oral medication or severe alterned mental status / unconcious.).  Type 2 diabetes mellitus with hyperglycemia, with long-term current use of insulin  (HCC) -     insulin  glargine, 2 Unit Dial , (TOUJEO  MAX SOLOSTAR) 300 UNIT/ML Solostar Pen; Inject 70 Units into the skin daily. May increase up to 100 units daily if instructed by your provider.  Other orders -     tirzepatide  (MOUNJARO ) 7.5 MG/0.5ML Pen; Inject 7.5 mg into the skin once a week.    DISPOSITION Follow up in clinic in 3  months suggested.   All questions answered and patient verbalized understanding of the plan.  [place] Trinity Haun, MD [facility] Endocrinology [facility] [address] [place], [address] [place], [place] [address] Phone # [phone]  At least part of this note was generated  using voice recognition software. Inadvertent word errors may have occurred, which were not recognized during the proofreading process.

## 2024-08-06 NOTE — Patient Instructions (Signed)
   Diabetes regimen  Increase Mounjaro  to 7.5 mg weekly. Decrease Toujeo  from 80 to 70 units daily. Decrease Humalog  from 40 to 30 units with meals 3 times a day.  Humalog  can be adjusted based on meal side but no more than 30 units at a time. Increase Jardiance  to 25 mg daily.  Continue metformin  1000 mg 2 times a day.

## 2024-08-07 ENCOUNTER — Other Ambulatory Visit: Payer: Self-pay

## 2024-08-07 ENCOUNTER — Other Ambulatory Visit (HOSPITAL_COMMUNITY): Payer: Self-pay

## 2024-08-07 ENCOUNTER — Telehealth: Payer: Self-pay | Admitting: Pharmacy Technician

## 2024-08-07 NOTE — Telephone Encounter (Signed)
 Pharmacy Patient Advocate Encounter   Received notification from CoverMyMeds that prior authorization for FreeStyle Libre 3 Plus Sensor  is required/requested.   Insurance verification completed.   The patient is insured through [facility] MEDICAID.   Per test claim: PA required; PA submitted to above mentioned insurance via Latent Key/confirmation #/EOC [id number] Status is pending

## 2024-08-08 ENCOUNTER — Other Ambulatory Visit: Payer: Self-pay

## 2024-08-08 DIAGNOSIS — M25552 Pain in left hip: Secondary | ICD-10-CM | POA: Diagnosis not present

## 2024-08-08 DIAGNOSIS — Z794 Long term (current) use of insulin: Secondary | ICD-10-CM | POA: Insufficient documentation

## 2024-08-08 DIAGNOSIS — R21 Rash and other nonspecific skin eruption: Secondary | ICD-10-CM | POA: Diagnosis present

## 2024-08-08 DIAGNOSIS — Z7984 Long term (current) use of oral hypoglycemic drugs: Secondary | ICD-10-CM | POA: Insufficient documentation

## 2024-08-08 DIAGNOSIS — E119 Type 2 diabetes mellitus without complications: Secondary | ICD-10-CM | POA: Diagnosis not present

## 2024-08-08 DIAGNOSIS — B084 Enteroviral vesicular stomatitis with exanthem: Secondary | ICD-10-CM | POA: Diagnosis not present

## 2024-08-08 DIAGNOSIS — J45909 Unspecified asthma, uncomplicated: Secondary | ICD-10-CM | POA: Diagnosis not present

## 2024-08-08 NOTE — Telephone Encounter (Signed)
 Having issues submitting PA electronically. Faxed PA manually. Faxed to: [phone]

## 2024-08-09 ENCOUNTER — Other Ambulatory Visit: Payer: Self-pay

## 2024-08-09 ENCOUNTER — Encounter (HOSPITAL_COMMUNITY): Payer: Self-pay

## 2024-08-09 ENCOUNTER — Emergency Department (HOSPITAL_COMMUNITY)
Admission: EM | Admit: 2024-08-09 | Discharge: 2024-08-09 | Disposition: A | Discharge status: Home / Self Care | Attending: Emergency Medicine | Admitting: Emergency Medicine

## 2024-08-09 DIAGNOSIS — B029 Zoster without complications: Secondary | ICD-10-CM

## 2024-08-09 DIAGNOSIS — B084 Enteroviral vesicular stomatitis with exanthem: Secondary | ICD-10-CM | POA: Diagnosis not present

## 2024-08-09 LAB — CBC WITH DIFFERENTIAL/PLATELET
Abs Immature Granulocytes: 0.08 K/uL — ABNORMAL HIGH (ref 0.00–0.07)
Basophils Absolute: 0.1 K/uL (ref 0.0–0.1)
Basophils Relative: 0 %
Eosinophils Absolute: 0.1 K/uL (ref 0.0–0.5)
Eosinophils Relative: 1 %
HCT: 42.6 % (ref 36.0–46.0)
Hemoglobin: 14 g/dL (ref 12.0–15.0)
Immature Granulocytes: 1 %
Lymphocytes Relative: 18 %
Lymphs Abs: 2.7 K/uL (ref 0.7–4.0)
MCH: 27.2 pg (ref 26.0–34.0)
MCHC: 32.9 g/dL (ref 30.0–36.0)
MCV: 82.7 fL (ref 80.0–100.0)
Monocytes Absolute: 1.2 K/uL — ABNORMAL HIGH (ref 0.1–1.0)
Monocytes Relative: 8 %
Neutro Abs: 10.8 K/uL — ABNORMAL HIGH (ref 1.7–7.7)
Neutrophils Relative %: 72 %
Platelets: 247 K/uL (ref 150–400)
RBC: 5.15 MIL/uL — ABNORMAL HIGH (ref 3.87–5.11)
RDW: 15.2 % (ref 11.5–15.5)
WBC: 14.9 K/uL — ABNORMAL HIGH (ref 4.0–10.5)
nRBC: 0 % (ref 0.0–0.2)

## 2024-08-09 LAB — COMPREHENSIVE METABOLIC PANEL WITH GFR
ALT: 8 U/L (ref 0–44)
AST: 13 U/L — ABNORMAL LOW (ref 15–41)
Albumin: 3.5 g/dL (ref 3.5–5.0)
Alkaline Phosphatase: 89 U/L (ref 38–126)
Anion gap: 10 (ref 5–15)
BUN: 14 mg/dL (ref 6–20)
CO2: 22 mmol/L (ref 22–32)
Calcium: 9.3 mg/dL (ref 8.9–10.3)
Chloride: 106 mmol/L (ref 98–111)
Creatinine, Ser: 1.63 mg/dL — ABNORMAL HIGH (ref 0.44–1.00)
GFR, Estimated: 39 mL/min — ABNORMAL LOW (ref >60)
Glucose, Bld: 49 mg/dL — ABNORMAL LOW (ref 70–99)
Potassium: 3.5 mmol/L (ref 3.5–5.1)
Sodium: 138 mmol/L (ref 135–145)
Total Bilirubin: 0.5 mg/dL (ref 0.0–1.2)
Total Protein: 6.1 g/dL — ABNORMAL LOW (ref 6.5–8.1)

## 2024-08-09 LAB — HCG, SERUM, QUALITATIVE: Preg, Serum: NEGATIVE

## 2024-08-09 LAB — CBG MONITORING, ED
Glucose-Capillary: 101 mg/dL — ABNORMAL HIGH (ref 70–99)
Glucose-Capillary: 50 mg/dL — ABNORMAL LOW (ref 70–99)
Glucose-Capillary: 52 mg/dL — ABNORMAL LOW (ref 70–99)
Glucose-Capillary: 66 mg/dL — ABNORMAL LOW (ref 70–99)
Glucose-Capillary: 71 mg/dL (ref 70–99)
Glucose-Capillary: 92 mg/dL (ref 70–99)
Glucose-Capillary: 99 mg/dL (ref 70–99)

## 2024-08-09 MED ORDER — VALACYCLOVIR HCL 500 MG PO TABS
1000.0000 mg | ORAL_TABLET | Freq: Once | ORAL | Status: AC
  Administered 2024-08-09: 1000 mg via ORAL
  Filled 2024-08-09 (×2): qty 2

## 2024-08-09 MED ORDER — VALACYCLOVIR HCL 1 G PO TABS: 1000.0000 mg | ORAL_TABLET | Freq: Two times a day (BID) | ORAL | 0 refills | Status: AC

## 2024-08-09 MED ORDER — GLUCOSE 40 % PO GEL
1.0000 | Freq: Once | ORAL | Status: AC
  Administered 2024-08-09: 31 g via ORAL
  Filled 2024-08-09: qty 1.21

## 2024-08-09 MED ORDER — OXYCODONE HCL 5 MG PO TABS
10.0000 mg | ORAL_TABLET | Freq: Once | ORAL | Status: AC
  Administered 2024-08-09: 10 mg via ORAL
  Filled 2024-08-09: qty 2

## 2024-08-09 MED ORDER — LIDOCAINE 5 % EX PTCH
1.0000 | MEDICATED_PATCH | CUTANEOUS | Status: DC
  Administered 2024-08-09: 1 via TRANSDERMAL
  Filled 2024-08-09: qty 1

## 2024-08-09 NOTE — Discharge Instructions (Addendum)
 You have a viral infection causing a rash and the pain in your leg known as shingles. Please take the prescribed antiviral medication for the entire course and follow up with your primary care doctor. Your home dose oxycodone  should help with some pain relief as well. Return tot he ER for any new severe symptoms. You may use benadryl  if you develop any itching associated with your rash.

## 2024-08-09 NOTE — ED Triage Notes (Addendum)
 Pt arrived from home via POV c/o left leg pain. Pt states that she has 3 bumps that have come up on it, noted to be fluid filled blisters. Pt noted to be hypoglycemic in triage. Pt drank 4 juices and ate a sandwich and cbg now at 92

## 2024-08-09 NOTE — ED Provider Notes (Signed)
 Jill Shaw Provider Note   CSN: [id number] Arrival date & time: 08/08/24  2346     Patient presents with: Leg Pain   Jill Shaw is a 48 y.o. female.  {Add pertinent medical, surgical, social history, OB history to HPI:32947} HPI     Prior to Admission medications   Medication Sig Start Date End Date Taking? Authorizing Provider  Accu-Chek Softclix Lancets lancets Use as instructed to monitor blood sugar 4 times daily 06/23/24   Nichols, Tonya S, NP  albuterol  (PROVENTIL ) (2.5 MG/3ML) 0.083% nebulizer solution Take 3 mLs (2.5 mg total) by nebulization every 6 (six) hours as needed for wheezing or shortness of breath. 01/31/24 01/30/25  Parrett, Madelin RAMAN, NP  albuterol  (VENTOLIN  HFA) 108 (90 Base) MCG/ACT inhaler Inhale 2 puffs into the lungs every 6 (six) hours as needed for wheezing or shortness of breath. 11/26/23   Oley Bascom RAMAN, NP  Blood Glucose Monitoring Suppl (ACCU-CHEK GUIDE) w/Device KIT Use as directed to monitor blood sugar 4 times daily (before each meal and before bed). 06/23/24   Nichols, Tonya S, NP  budesonide -formoterol  (SYMBICORT ) 160-4.5 MCG/ACT inhaler Inhale 2 puffs into the lungs 2 (two) times daily. 11/26/23   Oley Bascom RAMAN, NP  busPIRone  (BUSPAR ) 10 MG tablet Take 1 tablet (10 mg total) by mouth 2 (two) times daily. 03/03/24   Oley Bascom RAMAN, NP  Continuous Glucose Sensor (FREESTYLE LIBRE 3 PLUS SENSOR) MISC Change sensor every 15 days. Patient not taking: Reported on 08/06/2024 03/31/24   Oley Bascom RAMAN, NP  Continuous Glucose Sensor (FREESTYLE LIBRE 3 PLUS SENSOR) MISC Change every 15 days. 08/06/24   Thapa, Sudan, MD  Dexlansoprazole  30 MG capsule DR Take 1 capsule (30 mg total) by mouth daily. NEEDS OFFICE VISIT FOR ADDITIONAL REFILLS 04/24/24   Oley Bascom RAMAN, NP  diclofenac  Sodium (VOLTAREN ) 1 % GEL Apply 2 to 4 gram to painful sites up to 4 times daily if needed, max daily dose: 32 Gram 02/19/24      diclofenac  Sodium (VOLTAREN ) 1 % GEL apply 2 to 4 gram to painful sites up to 4 times daily if needed, max daily dose: 32 Gram 04/21/24     diclofenac  Sodium (VOLTAREN ) 1 % GEL Apply 2 to 4 grams to painful sites up to 4 times daily if needed, max daily dose 32 grams 05/21/24     diclofenac  Sodium (VOLTAREN ) 1 % GEL Apply 2 to 4 gram to painful sites up to 4 times daily if needed, max daily dose: 32 Gram 06/23/24     diclofenac  Sodium (VOLTAREN ) 1 % GEL Apply 2 to 4 gram to painful sites up to 4 times daily if needed, max daily dose: 32 Gram 07/23/24     dicyclomine  (BENTYL ) 20 MG tablet Take 1 tablet (20 mg total) by mouth 2 (two) times daily. 11/26/23   Oley Bascom RAMAN, NP  empagliflozin  (JARDIANCE ) 25 MG TABS tablet Take 1 tablet (25 mg total) by mouth daily before breakfast. 08/06/24   Thapa, Sudan, MD  ferrous sulfate  325 (65 FE) MG tablet Take 1 tablet (325 mg total) by mouth daily with breakfast. 03/30/24   Franchot Novel, MD  glucose blood (ACCU-CHEK GUIDE TEST) test strip Use as instructed to monitor blood sugar 4 times daily 06/23/24   Nichols, Tonya S, NP  GVOKE HYPOPEN  2-PACK 1 MG/0.2ML SOAJ Inject 1 mg into the skin as needed (severe hypoglycemia with not able to take oral medication or severe alterned  mental status / unconcious.). 08/06/24   Thapa, Sudan, MD  hydrALAZINE  (APRESOLINE ) 25 MG tablet Take 1 tablet (25 mg total) by mouth 3 (three) times daily. 05/28/24   Oley Bascom RAMAN, NP  insulin  glargine, 2 Unit Dial , (TOUJEO  MAX SOLOSTAR) 300 UNIT/ML Solostar Pen Inject 70 Units into the skin daily. May increase up to 100 units daily if instructed by your provider. 08/06/24   Thapa, Sudan, MD  insulin  lispro (HUMALOG  KWIKPEN) 100 UNIT/ML KwikPen Inject 30 Units into the skin with breakfast, with lunch, and with evening meal. May increase up to 40 units with each meal if instructed by your provider. 08/06/24   Thapa, Sudan, MD  Insulin  Pen Needle (PEN NEEDLES) 31G X 5 MM MISC Use as instructed to  inject insulin  four times day 06/12/24   Nichols, Tonya S, NP  levocetirizine (XYZAL) 5 MG tablet Take 5 mg by mouth every evening.    [provider]  losartan  (COZAAR ) 25 MG tablet 1 tab by mouth daily 06/12/24     Melatonin 10 MG TABS Take 10 mg by mouth at bedtime as needed (insomnia). 02/07/24   Jill Lenis, MD  metFORMIN  (GLUCOPHAGE -XR) 500 MG 24 hr tablet Take 2 tablets (1,000 mg total) by mouth 2 (two) times daily with a meal. 04/29/24   [name], [name], FNP  metoCLOPramide  (REGLAN ) 10 MG tablet Take 1 tablet (10 mg total) by mouth every 8 (eight) hours as needed for nausea. 10/07/23   Rancour, Garnette, MD  metoprolol  succinate (TOPROL -XL) 100 MG 24 hr tablet Take 1 tablet (100 mg total) by mouth daily. Take with or immediately following a meal. 07/21/24   Oley Bascom RAMAN, NP  naloxone  (NARCAN ) nasal spray 4 mg/0.1 mL Insert 1 spray into one nostril if poorly responding / turning blue. CALL 911 ASAP 10/13/23     nitroGLYCERIN  (NITROSTAT ) 0.4 MG SL tablet Place 1 tablet (0.4 mg total) under the tongue every 5 (five) minutes as needed for chest pain. Additional refills to be filled by PCP, patient aware 07/14/24   Oley Bascom RAMAN, NP  norethindrone  (AYGESTIN ) 5 MG tablet Take 1 tablet (5 mg total) by mouth 2 (two) times daily as needed (bleeding). 06/09/24   Ajewole, Christana, MD  ondansetron  (ZOFRAN -ODT) 4 MG disintegrating tablet Take 1 tablet (4 mg total) by mouth every 8 (eight) hours as needed for nausea or vomiting. 05/27/24   Oley Bascom RAMAN, NP  Oxycodone  HCl 10 MG TABS Take 1 tablet (10 mg total) by mouth 4 (four) times daily as needed for pain 04/21/24     Oxycodone  HCl 10 MG TABS Take 1 tablet (10 mg total) by mouth 4 (four) times daily if needed for pain 05/21/24     Oxycodone  HCl 10 MG TABS Take 1 tablet (10 mg total) by mouth 4 (four) times daily as needed for pain 06/20/24 06/19/24     Oxycodone  HCl 10 MG TABS Take 1 tablet (10 mg total) by mouth 4 (four) times daily as  needed for pain. 07/23/24     potassium chloride  SA (KLOR-CON  M) 20 MEQ tablet Take 1 tablet (20 mEq total) by mouth daily. 03/11/24   Oley Bascom RAMAN, NP  predniSONE  (STERAPRED UNI-PAK 21 TAB) 10 MG (21) TBPK tablet Take by mouth as directed on the package for 6 days 08/04/24   Raulkar, Sven SQUIBB, MD  pregabalin  (LYRICA ) 150 MG capsule Take 1 capsule (150 mg total) by mouth 3 (three) times daily. 06/10/24   Lorilee Sven SQUIBB, MD  ramelteon  (ROZEREM ) 8 MG tablet Take 1 tablet (8 mg total) by mouth at bedtime. 07/29/24   Ezzard Staci SAILOR, NP  rosuvastatin  (CRESTOR ) 20 MG tablet Take 1 tablet (20 mg total) by mouth daily. KEEP OV. 04/25/24   Patwardhan, Newman PARAS, MD  senna (SENOKOT) 8.6 MG TABS tablet Take 1 tablet (8.6 mg total) by mouth daily. 03/30/24   Franchot Novel, MD  spironolactone  (ALDACTONE ) 25 MG tablet Take 1 tablet (25 mg total) by mouth daily. 07/22/24   Oley Bascom RAMAN, NP  sucralfate  (CARAFATE ) 1 g tablet Take 1 tablet (1 g total) by mouth 4 (four) times daily -  with meals and at bedtime. 03/11/24   Zehr, Jessica D, PA-C  tirzepatide  (MOUNJARO ) 7.5 MG/0.5ML Pen Inject 7.5 mg into the skin once a week. 08/06/24   Thapa, Sudan, MD  tizanidine  (ZANAFLEX ) 6 MG capsule Take 1 capsule (6 mg total) by mouth 3 (three) times daily. 08/04/24   Raulkar, Sven SQUIBB, MD  torsemide  (DEMADEX ) 20 MG tablet Take 3 tablets (60 mg total) by mouth in the morning for swelling. 06/12/24       Allergies: Elavil  [amitriptyline ], Orudis [ketoprofen], Desyrel  [trazodone ], Tylenol  [acetaminophen ], Ambien  [zolpidem  tartrate], Aspirin , Hydroxyzine  hcl, Motrin  [ibuprofen ], Naprosyn [naproxen], Neurontin [gabapentin], Sulfa antibiotics, Ultram  [tramadol ], Victoza [liraglutide], and Zegerid [omeprazole -sodium bicarbonate ]    Review of Systems  Updated Vital Signs BP (!) 189/87 (BP Location: Right Wrist)   Pulse (!) 106   Temp (!) 97.5 F (36.4 C)   Resp (!) 21   Ht 5' 4 (1.626 m)   Wt 134.1 kg   BMI 50.75  kg/m   Physical Exam Skin:       (all labs ordered are listed, but only abnormal results are displayed) Labs Reviewed  CBC WITH DIFFERENTIAL/PLATELET - Abnormal; Notable for the following components:      Result Value   WBC 14.9 (*)    RBC 5.15 (*)    Neutro Abs 10.8 (*)    Monocytes Absolute 1.2 (*)    Abs Immature Granulocytes 0.08 (*)    All other components within normal limits  COMPREHENSIVE METABOLIC PANEL WITH GFR - Abnormal; Notable for the following components:   Glucose, Bld 49 (*)    Creatinine, Ser 1.63 (*)    Total Protein 6.1 (*)    AST 13 (*)    GFR, Estimated 39 (*)    All other components within normal limits  CBG MONITORING, ED - Abnormal; Notable for the following components:   Glucose-Capillary 50 (*)    All other components within normal limits  CBG MONITORING, ED - Abnormal; Notable for the following components:   Glucose-Capillary 52 (*)    All other components within normal limits  CBG MONITORING, ED - Abnormal; Notable for the following components:   Glucose-Capillary 101 (*)    All other components within normal limits  CBG MONITORING, ED - Abnormal; Notable for the following components:   Glucose-Capillary 66 (*)    All other components within normal limits  HCG, SERUM, QUALITATIVE  CBG MONITORING, ED  CBG MONITORING, ED    EKG: None  Radiology: No results found.  {Document cardiac monitor, telemetry assessment procedure when appropriate:32947} Procedures   Medications Ordered in the ED  dextrose  (GLUTOSE) oral gel 40% (peds > 20kg and adults) (31 g Oral Given 08/09/24 0359)      {Click here for ABCD2, HEART and other calculators REFRESH Note before signing:1}  Medical Decision Making Amount and/or Complexity of Data Reviewed Labs: ordered.  Risk OTC drugs. Prescription drug management.   ***  {Document critical care time when appropriate  Document review of labs and clinical decision tools ie  CHADS2VASC2, etc  Document your independent review of radiology images and any outside records  Document your discussion with family members, caretakers and with consultants  Document social determinants of health affecting pt's care  Document your decision making why or why not admission, treatments were needed:32947:::1}   Final diagnoses:  None    ED Discharge Orders     None

## 2024-08-11 ENCOUNTER — Other Ambulatory Visit: Payer: Self-pay | Admitting: Nurse Practitioner

## 2024-08-11 ENCOUNTER — Ambulatory Visit: Payer: Self-pay

## 2024-08-11 ENCOUNTER — Other Ambulatory Visit (HOSPITAL_COMMUNITY): Payer: Self-pay

## 2024-08-11 ENCOUNTER — Other Ambulatory Visit: Payer: Self-pay

## 2024-08-11 DIAGNOSIS — I5032 Chronic diastolic (congestive) heart failure: Secondary | ICD-10-CM

## 2024-08-11 NOTE — Telephone Encounter (Signed)
 Pharmacy Patient Advocate Encounter  Received notification from [place] [employer] that Prior Authorization for FreeStyle Libre 3 Plus Sensor  has been APPROVED from 08/09/24 to 02/05/25   PA #/Case ID/Reference #: [id number]  **Only approved for 6 months.**

## 2024-08-11 NOTE — Telephone Encounter (Signed)
 FYI Only or Action Required?: FYI only for provider.  Patient was last seen in primary care on 05/27/2024 by Oley Bascom RAMAN, NP.  Called Nurse Triage reporting Leg Pain.  Symptoms began a week ago.  Interventions attempted: Other: Patient is on medication prescribed at ER.  Symptoms are: stable.  Triage Disposition: See HCP Within 4 Hours (Or PCP Triage)  Patient/caregiver understands and will follow disposition?: Yes   Copied from CRM [id number]. Topic: Clinical - Red Word Triage >> Aug 11, 2024 10:05 AM Hadassah PARAS wrote: Red Word that prompted transfer to Nurse Triage: Severe Constant pain on left leg, from shingles and sciatic nerve pan. Transferred to NT. Call dropped Reason for Disposition  [1] SEVERE pain (e.g., excruciating, unable to do any normal activities) AND [2] not improved after 2 hours of pain medicine  Answer Assessment - Initial Assessment Questions Patient offered appt for evaluation. Patient states she does not want to book right now because she wants to evaluate how the medication she received from the hospital will work. She is requesting pain medication and would like a phone call from clinic staff to discuss. She would like the medications sent to [facility] or [facility] on [address].  1. ONSET: When did the pain start?      16th and 25th seen in ED and diagnosed with Shingles 2. LOCATION: Where is the pain located?      Left leg 3. PAIN: How bad is the pain?    (Scale 1-10; or mild, moderate, severe)     10 5. CAUSE: What do you think is causing the leg pain?     Shingles and sciatica pain 6. OTHER SYMPTOMS: Do you have any other symptoms? (e.g., chest pain, back pain, breathing difficulty, swelling, rash, fever, numbness, weakness)     No  Protocols used: Leg Pain-A-AH

## 2024-08-12 ENCOUNTER — Other Ambulatory Visit: Payer: Self-pay

## 2024-08-12 ENCOUNTER — Other Ambulatory Visit (HOSPITAL_COMMUNITY): Payer: Self-pay

## 2024-08-12 NOTE — Telephone Encounter (Signed)
 Copied from CRM [id number]. Topic: Clinical - Red Word Triage >> Aug 11, 2024 10:05 AM Hadassah PARAS wrote: Red Word that prompted transfer to Nurse Triage: Severe Constant pain on left leg, from shingles and sciatic nerve pan. Transferred to NT. Call dropped Reason for Disposition  [1] SEVERE pain (e.g., excruciating, unable to do any normal activities) AND [2] not improved after 2 hours of pain medicine  Answer Assessment - Initial Assessment Questions Patient offered appt for evaluation. Patient states she does not want to book right now because she wants to evaluate how the medication she received from the hospital will work. She is requesting pain medication and would like a phone call from clinic staff to discuss. She would like the medications sent to [facility] or [facility] on [address].  1. ONSET: When did the pain start?      16th and 25th seen in ED and diagnosed with Shingles 2. LOCATION: Where is the pain located?      Left leg 3. PAIN: How bad is the pain?    (Scale 1-10; or mild, moderate, severe)     10 5. CAUSE: What do you think is causing the leg pain?     Shingles and sciatica pain 6. OTHER SYMPTOMS: Do you have any other symptoms? (e.g., chest pain, back pain, breathing difficulty, swelling, rash, fever, numbness, weakness)     No  Protocols used: Leg Pain-A-AH

## 2024-08-14 ENCOUNTER — Telehealth: Payer: Self-pay

## 2024-08-14 ENCOUNTER — Other Ambulatory Visit: Payer: Self-pay

## 2024-08-14 ENCOUNTER — Other Ambulatory Visit: Payer: Self-pay | Admitting: Vascular Surgery

## 2024-08-14 DIAGNOSIS — R600 Localized edema: Secondary | ICD-10-CM

## 2024-08-14 NOTE — Telephone Encounter (Signed)
 Copied from CRM [id number]. Topic: Clinical - Medication Question >> Aug 14, 2024  1:33 PM Delon DASEN wrote: Reason for CRM: Dianna with [facility] sending fax requesting diabetic supplies for patient    [facility]

## 2024-08-15 ENCOUNTER — Encounter: Payer: Self-pay | Admitting: Nurse Practitioner

## 2024-08-15 ENCOUNTER — Other Ambulatory Visit: Payer: Self-pay

## 2024-08-15 ENCOUNTER — Ambulatory Visit (INDEPENDENT_AMBULATORY_CARE_PROVIDER_SITE_OTHER): Payer: Self-pay | Admitting: Nurse Practitioner

## 2024-08-15 ENCOUNTER — Other Ambulatory Visit (HOSPITAL_COMMUNITY): Payer: Self-pay

## 2024-08-15 VITALS — BP 122/61 | HR 92 | Wt 288.0 lb

## 2024-08-15 DIAGNOSIS — Z794 Long term (current) use of insulin: Secondary | ICD-10-CM

## 2024-08-15 DIAGNOSIS — E782 Mixed hyperlipidemia: Secondary | ICD-10-CM

## 2024-08-15 DIAGNOSIS — E11649 Type 2 diabetes mellitus with hypoglycemia without coma: Secondary | ICD-10-CM | POA: Diagnosis not present

## 2024-08-15 DIAGNOSIS — B029 Zoster without complications: Secondary | ICD-10-CM | POA: Insufficient documentation

## 2024-08-15 DIAGNOSIS — Z09 Encounter for follow-up examination after completed treatment for conditions other than malignant neoplasm: Secondary | ICD-10-CM | POA: Diagnosis not present

## 2024-08-15 DIAGNOSIS — N179 Acute kidney failure, unspecified: Secondary | ICD-10-CM | POA: Diagnosis not present

## 2024-08-15 DIAGNOSIS — I10 Essential (primary) hypertension: Secondary | ICD-10-CM

## 2024-08-15 NOTE — Assessment & Plan Note (Signed)
 Hospital chart reviewed, including discharge summary Medications reconciled and reviewed with the patient in detail

## 2024-08-15 NOTE — Assessment & Plan Note (Addendum)
 Lab Results  Component Value Date   HGBA1C 7.4 (A) 08/06/2024    No recent hypoglycemic episodes since hospital discharge. Freestyle Libre for glucose monitoring planned. Advised to adjust Humalog  to 30 units three times daily, decrease Toujeo  to 70 units daily as per endocrinologist's advice  and due to her recent hypoglycemic episode, rather than waiting to she starts Mounjaro  - Increase Mounjaro  to 7.5 mg  as planned. - Continue Jardiance  at 25 mg, metformin  1000 mg twice daily - Pick up and use Freestyle Libre for continuous glucose monitoring. -  app was set up for remote monitoring by the CMA  Has  a prescription for Gvoke hypopen 

## 2024-08-15 NOTE — Progress Notes (Signed)
 Established Patient Office Visit  Subjective:  Patient ID: Jill Shaw, female    DOB: 03-25-1976  Age: 48 y.o. MRN: [id number]  CC:  Chief Complaint  Patient presents with   Hospitalization Follow-up    HPI   Discussed the use of AI scribe software for clinical note transcription with the patient, who gave verbal consent to proceed.  History of Present Illness Jill Shaw is a 48 year old female  has a past medical history of Abnormal uterine bleeding (AUB), Arthritis, Atypical chest pain, Chronic diastolic (congestive) heart failure (HCC), Chronic iron  deficiency anemia, Chronic pain syndrome, CKD (chronic kidney disease), stage III (HCC), Diabetic gastroparesis (HCC), Diabetic peripheral neuropathy (HCC), Fibromyalgia, GAD (generalized anxiety disorder), Generalized abdominal pain, GERD (gastroesophageal reflux disease), History of acute renal failure (03/21/2023), History of chronic gastritis, History of diabetic ketoacidosis, History of seizure (01/2016), History of vertebral compression fracture (12/2020), Hyperlipidemia, Hypertension, Insulin  dependent type 2 diabetes mellitus (HCC), Irritable bowel syndrome with constipation, MDD (major depressive disorder), Moderate COPD (chronic obstructive pulmonary disease) (HCC), Moderate persistent asthma, Sickle cell trait, Vitamin D  deficiency (10/2019), and Wears glasses.  who presents with persistent shingle pain and rash.  Initially diagnosed with shingles on August 09, 2024, she has been experiencing pain for about a month. She visited the hospital twice due to the severity of her symptoms. Currently, she is taking Valtrex 1000 mg twice daily. The rash is drying up, but she continues to experience significant pain, particularly when lying down, described as 'electric shocks' and a 'burning sensation'.She is also on oxycodone  and Lyrica  for chronic pain management,   She has a history of diabetes and has been experiencing weakness  and sickness since the onset of shingles. She is currently taking Humalog  40 units three times daily, Mounjaro  5 mg with plans to increase to 7.5 mg, Lantus  80 units with plans to decrease to 70 units, metformin  1000 mg twice daily Jardiance  25 mg. Blood sugar was low at the ED but she denies  recent low blood sugars since her hospital visit, but her blood sugar levels have been variable.  She has been diabetic for 29 years and mentions that both she and her mother have diabetes, though their blood sugar levels differ. She plans to pick up  Freestyle Libre from the pharmacy.     Assessment & Plan     Past Medical History:  Diagnosis Date   Abnormal uterine bleeding (AUB)    Arthritis    knees, hands   Atypical chest pain    cardiology--- dr elmira did cardiac cath 08-30-2021 showed minimal luminal irregularity involving LAD all other coronaries w/ normal  flow   Chronic diastolic (congestive) heart failure [facility])    cardiologist---- dr elmira;  preserved ef   Chronic iron  deficiency anemia    Chronic pain syndrome    followed by pain management--- dr lorilee   CKD (chronic kidney disease), stage III (HCC)    Diabetic gastroparesis (HCC)    Diabetic peripheral neuropathy (HCC)    Fibromyalgia    GAD (generalized anxiety disorder)    Generalized abdominal pain    GERD (gastroesophageal reflux disease)    History of acute renal failure 03/21/2023   admission in epic due to N/V/D due ot severe sepsis POA due to UTI   History of chronic gastritis    inflammatory   History of diabetic ketoacidosis    multiple admission's last 3 in epic 06/ 2022;  01/ 2022;   09/ 2021  History of seizure 01/2016   hypoglycemic seizure   History of vertebral compression fracture 12/2020   T11 -- T12 & L1   Hyperlipidemia    Hypertension    followed by pcp   Insulin  dependent type 2 diabetes mellitus (HCC)    uncontrolled,  followed by pcp   Irritable bowel syndrome with constipation    MDD  (major depressive disorder)    Moderate COPD (chronic obstructive pulmonary disease) (HCC)    pulmology--- dr brenna   Moderate persistent asthma    Sickle cell trait    Vitamin D  deficiency 10/2019   Wears glasses     Past Surgical History:  Procedure Laterality Date   CATARACT EXTRACTION W/ INTRAOCULAR LENS IMPLANT Left    CESAREAN SECTION  2001   for twins   DILATION AND CURETTAGE OF UTERUS N/A 08/20/2019   Procedure: DILATATION AND CURETTAGE;  Surgeon: Starla Harland BROCKS, MD;  Location: [facility];  Service: Gynecology;  Laterality: N/A;   DILATION AND CURETTAGE OF UTERUS  05/01/2023   Procedure: DILATATION AND CURETTAGE;  Surgeon: Jeralyn Crutch, MD;  Location: [facility];  Service: Gynecology;;   ENDOMETRIAL ABLATION N/A 08/20/2019   Procedure: Lela Ablation;  Surgeon: Starla Harland BROCKS, MD;  Location: [facility];  Service: Gynecology;  Laterality: N/A;   EYE SURGERY Bilateral    laser right and cataract removed left eye   HYSTEROSCOPY N/A 05/01/2023   Procedure: HYSTEROSCOPY;  Surgeon: Jeralyn Crutch, MD;  Location: [facility];  Service: Gynecology;  Laterality: N/A;   INTRAUTERINE DEVICE (IUD) INSERTION N/A 05/01/2023   Procedure: INTRAUTERINE DEVICE (IUD) INSERTION;  Surgeon: Jeralyn Crutch, MD;  Location: [facility];  Service: Gynecology;  Laterality: N/A;   LEFT HEART CATH AND CORONARY ANGIOGRAPHY N/A 08/30/2021   Procedure: LEFT HEART CATH AND CORONARY ANGIOGRAPHY;  Surgeon: Elmira Newman PARAS, MD;  Location: [facility];  Service: Cardiovascular;  Laterality: N/A;   RADIOLOGY WITH ANESTHESIA N/A 09/16/2019   Procedure: MRI WITH ANESTHESIA   L SPINE WITHOUT CONTRAST, T SPINE WITHOUT CONTRAST , CERVICAL WITHOUT CONTRAST;  Surgeon: Radiologist, Medication, MD;  Location: [facility];  Service: Radiology;  Laterality: N/A;   TOOTH EXTRACTION N/A 01/14/2024   Procedure: DENTAL RESTORATION/EXTRACTIONS;  Surgeon: Sheryle Hamilton, DMD;   Location: [facility];  Service: Oral Surgery;  Laterality: N/A;   TUBAL LIGATION     interval BTL   UPPER GI ENDOSCOPY  07/2017    Family History  Problem Relation Age of Onset   Diabetes Mother    Hypertension Mother    Colon cancer Maternal Grandfather    Migraines Paternal Grandfather    Colon cancer Maternal Aunt    Breast cancer Maternal Aunt    Esophageal cancer Neg Hx    Stomach cancer Neg Hx    Rectal cancer Neg Hx     Social History   Socioeconomic History   Marital status: Divorced    Spouse name: Not on file   Number of children: 3   Years of education: Not on file   Highest education level: Not on file  Occupational History   Occupation: unemployed  Tobacco Use   Smoking status: Some Days    Current packs/day: 0.00    Average packs/day: 0.3 packs/day for 26.0 years (6.5 ttl pk-yrs)    Types: Cigarettes    Start date: 09/13/1994    Last attempt to quit: 09/13/2020    Years since quitting: 3.9   Smokeless tobacco: Never  Tobacco comments:    Pt smokes 1/2 ppd. AB. CMA 09-10-23  Vaping Use   Vaping status: Never Used  Substance and Sexual Activity   Alcohol use: No   Drug use: No   Sexual activity: Not Currently    Birth control/protection: I.U.D.  Other Topics Concern   Not on file  Social History Narrative   Right Handed   Lives in a one story apartment, but lives on the second floor   Drinks caffeine  once in awhile   Social Drivers of Health   Financial Resource Strain: Low Risk  (03/18/2023)   Overall Financial Resource Strain (CARDIA)    Difficulty of Paying Living Expenses: Not very hard  Food Insecurity: No Food Insecurity (03/27/2024)   Hunger Vital Sign    Worried About Running Out of Food in the Last Year: Never true    Ran Out of Food in the Last Year: Never true  Transportation Needs: No Transportation Needs (03/27/2024)   PRAPARE - Administrator, Civil Service (Medical): No    Lack of Transportation (Non-Medical): No   Physical Activity: Inactive (03/18/2023)   Exercise Vital Sign    Days of Exercise per Week: 0 days    Minutes of Exercise per Session: 0 min  Stress: Stress Concern Present (03/18/2023)   [employer] of Occupational Health - Occupational Stress Questionnaire    Feeling of Stress : To some extent  Social Connections: Moderately Isolated (02/04/2024)   Social Connection and Isolation Panel    Frequency of Communication with Friends and Family: More than three times a week    Frequency of Social Gatherings with Friends and Family: Three times a week    Attends Religious Services: 1 to 4 times per year    Active Member of Clubs or Organizations: No    Attends Banker Meetings: Never    Marital Status: Separated  Intimate Partner Violence: Not At Risk (03/27/2024)   Humiliation, Afraid, Rape, and Kick questionnaire    Fear of Current or Ex-Partner: No    Emotionally Abused: No    Physically Abused: No    Sexually Abused: No    Outpatient Medications Prior to Visit  Medication Sig Dispense Refill   Accu-Chek Softclix Lancets lancets Use as instructed to monitor blood sugar 4 times daily 300 each 4   albuterol  (PROVENTIL ) (2.5 MG/3ML) 0.083% nebulizer solution Take 3 mLs (2.5 mg total) by nebulization every 6 (six) hours as needed for wheezing or shortness of breath. 75 mL 2   albuterol  (VENTOLIN  HFA) 108 (90 Base) MCG/ACT inhaler Inhale 2 puffs into the lungs every 6 (six) hours as needed for wheezing or shortness of breath. 8.5 g 3   Blood Glucose Monitoring Suppl (ACCU-CHEK GUIDE) w/Device KIT Use as directed to monitor blood sugar 4 times daily (before each meal and before bed). 1 kit 0   budesonide -formoterol  (SYMBICORT ) 160-4.5 MCG/ACT inhaler Inhale 2 puffs into the lungs 2 (two) times daily. 10.2 g 5   busPIRone  (BUSPAR ) 10 MG tablet Take 1 tablet (10 mg total) by mouth 2 (two) times daily. 60 tablet 2   Dexlansoprazole  30 MG capsule DR Take 1 capsule (30 mg total) by  mouth daily. NEEDS OFFICE VISIT FOR ADDITIONAL REFILLS 90 capsule 1   diclofenac  Sodium (VOLTAREN ) 1 % GEL apply 2 to 4 gram to painful sites up to 4 times daily if needed, max daily dose: 32 Gram 300 g 1   dicyclomine  (BENTYL ) 20 MG tablet Take  1 tablet (20 mg total) by mouth 2 (two) times daily. 60 tablet 3   empagliflozin  (JARDIANCE ) 25 MG TABS tablet Take 1 tablet (25 mg total) by mouth daily before breakfast. 90 tablet 3   ferrous sulfate  325 (65 FE) MG tablet Take 1 tablet (325 mg total) by mouth daily with breakfast. 30 tablet 0   glucose blood (ACCU-CHEK GUIDE TEST) test strip Use as instructed to monitor blood sugar 4 times daily 300 each 4   GVOKE HYPOPEN  2-PACK 1 MG/0.2ML SOAJ Inject 1 mg into the skin as needed (severe hypoglycemia with not able to take oral medication or severe alterned mental status / unconcious.). 0.4 mL 1   hydrALAZINE  (APRESOLINE ) 25 MG tablet Take 1 tablet (25 mg total) by mouth 3 (three) times daily. 90 tablet 0   insulin  glargine, 2 Unit Dial , (TOUJEO  MAX SOLOSTAR) 300 UNIT/ML Solostar Pen Inject 70 Units into the skin daily. May increase up to 100 units daily if instructed by your provider. 18 mL 5   insulin  lispro (HUMALOG  KWIKPEN) 100 UNIT/ML KwikPen Inject 30 Units into the skin with breakfast, with lunch, and with evening meal. May increase up to 40 units with each meal if instructed by your provider. 36 mL 5   Insulin  Pen Needle (PEN NEEDLES) 31G X 5 MM MISC Use as instructed to inject insulin  four times day 200 each 3   levocetirizine (XYZAL) 5 MG tablet Take 5 mg by mouth every evening.     losartan  (COZAAR ) 25 MG tablet 1 tab by mouth daily 90 tablet 3   metFORMIN  (GLUCOPHAGE -XR) 500 MG 24 hr tablet Take 2 tablets (1,000 mg total) by mouth 2 (two) times daily with a meal. 360 tablet 1   metoCLOPramide  (REGLAN ) 10 MG tablet Take 1 tablet (10 mg total) by mouth every 8 (eight) hours as needed for nausea. 20 tablet 0   metoprolol  succinate (TOPROL -XL) 100 MG  24 hr tablet Take 1 tablet (100 mg total) by mouth daily. Take with or immediately following a meal. 90 tablet 0   naloxone  (NARCAN ) nasal spray 4 mg/0.1 mL Insert 1 spray into one nostril if poorly responding / turning blue. CALL 911 ASAP 2 each 0   nitroGLYCERIN  (NITROSTAT ) 0.4 MG SL tablet Place 1 tablet (0.4 mg total) under the tongue every 5 (five) minutes as needed for chest pain. Additional refills to be filled by PCP, patient aware 25 tablet 0   norethindrone  (AYGESTIN ) 5 MG tablet Take 1 tablet (5 mg total) by mouth 2 (two) times daily as needed (bleeding). 90 tablet 8   ondansetron  (ZOFRAN -ODT) 4 MG disintegrating tablet Take 1 tablet (4 mg total) by mouth every 8 (eight) hours as needed for nausea or vomiting. 20 tablet 0   Oxycodone  HCl 10 MG TABS Take 1 tablet (10 mg total) by mouth 4 (four) times daily as needed for pain 120 tablet 0   potassium chloride  SA (KLOR-CON  M) 20 MEQ tablet Take 1 tablet (20 mEq total) by mouth daily. 30 tablet 0   pregabalin  (LYRICA ) 150 MG capsule Take 1 capsule (150 mg total) by mouth 3 (three) times daily. 270 capsule 3   ramelteon  (ROZEREM ) 8 MG tablet Take 1 tablet (8 mg total) by mouth at bedtime. 30 tablet 0   rosuvastatin  (CRESTOR ) 20 MG tablet Take 1 tablet (20 mg total) by mouth daily. KEEP OV. 90 tablet 0   senna (SENOKOT) 8.6 MG TABS tablet Take 1 tablet (8.6 mg total) by mouth daily. 120  tablet 0   spironolactone  (ALDACTONE ) 25 MG tablet Take 1 tablet (25 mg total) by mouth daily. 90 tablet 0   sucralfate  (CARAFATE ) 1 g tablet Take 1 tablet (1 g total) by mouth 4 (four) times daily -  with meals and at bedtime. 120 tablet 3   tirzepatide  (MOUNJARO ) 7.5 MG/0.5ML Pen Inject 7.5 mg into the skin once a week. 6 mL 4   tizanidine  (ZANAFLEX ) 6 MG capsule Take 1 capsule (6 mg total) by mouth 3 (three) times daily. 90 capsule 3   torsemide  (DEMADEX ) 20 MG tablet Take 3 tablets (60 mg total) by mouth in the morning for swelling. 270 tablet 3    valACYclovir (VALTREX) 1000 MG tablet Take 1 tablet (1,000 mg total) by mouth 2 (two) times daily for 7 days. 14 tablet 0   Continuous Glucose Sensor (FREESTYLE LIBRE 3 PLUS SENSOR) MISC Change sensor every 15 days. (Patient not taking: Reported on 08/15/2024) 2 each 11   Continuous Glucose Sensor (FREESTYLE LIBRE 3 PLUS SENSOR) MISC Change every 15 days. (Patient not taking: Reported on 08/15/2024) 6 each 3   diclofenac  Sodium (VOLTAREN ) 1 % GEL Apply 2 to 4 gram to painful sites up to 4 times daily if needed, max daily dose: 32 Gram 300 g 1   Melatonin 10 MG TABS Take 10 mg by mouth at bedtime as needed (insomnia). (Patient not taking: Reported on 08/15/2024) 30 tablet 0   Oxycodone  HCl 10 MG TABS Take 1 tablet (10 mg total) by mouth 4 (four) times daily if needed for pain 120 tablet 0   Oxycodone  HCl 10 MG TABS Take 1 tablet (10 mg total) by mouth 4 (four) times daily as needed for pain 06/20/24 12 tablet 0   Oxycodone  HCl 10 MG TABS Take 1 tablet (10 mg total) by mouth 4 (four) times daily as needed for pain. 120 tablet 0   diclofenac  Sodium (VOLTAREN ) 1 % GEL Apply 2 to 4 grams to painful sites up to 4 times daily if needed, max daily dose 32 grams 300 g 1   diclofenac  Sodium (VOLTAREN ) 1 % GEL Apply 2 to 4 gram to painful sites up to 4 times daily if needed, max daily dose: 32 Gram 300 g 1   diclofenac  Sodium (VOLTAREN ) 1 % GEL Apply 2 to 4 gram to painful sites up to 4 times daily if needed, max daily dose: 32 Gram 300 g 1   predniSONE  (STERAPRED UNI-PAK 21 TAB) 10 MG (21) TBPK tablet Take by mouth as directed on the package for 6 days (Patient not taking: Reported on 08/15/2024) 21 tablet 0   No facility-administered medications prior to visit.    Allergies  Allergen Reactions   Elavil  [Amitriptyline ] Other (See Comments)    Coma   Orudis [Ketoprofen] Nausea And Vomiting   Desyrel  [Trazodone ] Nausea And Vomiting   Tylenol  [Acetaminophen ] Nausea And Vomiting   Ambien  [Zolpidem  Tartrate]  Nausea And Vomiting    She also reported headache   Aspirin  Nausea Only   Hydroxyzine  Hcl Rash   Motrin  [Ibuprofen ] Nausea And Vomiting   Naprosyn [Naproxen] Nausea And Vomiting   Neurontin [Gabapentin] Nausea And Vomiting and Other (See Comments)    upset stomach   Sulfa Antibiotics Nausea And Vomiting   Ultram  [Tramadol ] Nausea And Vomiting and Other (See Comments)    stomach upset   Victoza [Liraglutide] Nausea And Vomiting   Zegerid [Omeprazole -Sodium Bicarbonate ] Nausea And Vomiting    ROS Review of Systems  Constitutional:  Negative for appetite change, chills and fatigue.  HENT:  Negative for congestion, postnasal drip, rhinorrhea and sneezing.   Respiratory:  Negative for cough, shortness of breath and wheezing.   Cardiovascular:  Negative for chest pain, palpitations and leg swelling.  Gastrointestinal:  Negative for abdominal pain, constipation, nausea and vomiting.  Genitourinary:  Negative for difficulty urinating, dysuria, flank pain and frequency.  Musculoskeletal:  Positive for arthralgias. Negative for joint swelling and myalgias.  Skin:  Positive for rash. Negative for pallor and wound.  Neurological:  Negative for dizziness, facial asymmetry and headaches.  Psychiatric/Behavioral:  Negative for behavioral problems, confusion, self-injury and suicidal ideas.       Objective:    Physical Exam Vitals and nursing note reviewed.  Constitutional:      General: She is not in acute distress.    Appearance: Normal appearance. She is obese. She is not ill-appearing, toxic-appearing or diaphoretic.  Eyes:     General: No scleral icterus.       Right eye: No discharge.        Left eye: No discharge.     Extraocular Movements: Extraocular movements intact.     Conjunctiva/sclera: Conjunctivae normal.  Cardiovascular:     Rate and Rhythm: Normal rate and regular rhythm.     Pulses: Normal pulses.     Heart sounds: Normal heart sounds. No murmur heard.    No friction  rub. No gallop.  Pulmonary:     Effort: Pulmonary effort is normal. No respiratory distress.     Breath sounds: Normal breath sounds. No stridor. No wheezing, rhonchi or rales.  Chest:     Chest wall: No tenderness.  Abdominal:     General: There is no distension.     Palpations: Abdomen is soft.     Tenderness: There is no abdominal tenderness. There is no right CVA tenderness, left CVA tenderness or guarding.  Musculoskeletal:        General: No deformity or signs of injury.  Skin:    General: Skin is warm and dry.     Capillary Refill: Capillary refill takes less than 2 seconds.     Coloration: Skin is not jaundiced or pale.     Findings: Rash present. No bruising, erythema or lesion.     Comments: Scabs  noted on left hip, no drainage or redness  noted  Neurological:     Mental Status: She is alert and oriented to person, place, and time.     Motor: No weakness.     Coordination: Coordination normal.     Gait: Gait normal.  Psychiatric:        Mood and Affect: Mood normal.        Behavior: Behavior normal.        Thought Content: Thought content normal.        Judgment: Judgment normal.     BP 122/61   Pulse 92   Wt 288 lb (130.6 kg)   SpO2 100%   BMI 49.44 kg/m  Wt Readings from Last 3 Encounters:  08/15/24 288 lb (130.6 kg)  08/09/24 295 lb 10.2 oz (134.1 kg)  08/06/24 295 lb 9.6 oz (134.1 kg)    Lab Results  Component Value Date   TSH 1.813 07/30/2023   Lab Results  Component Value Date   WBC 14.9 (H) 08/09/2024   HGB 14.0 08/09/2024   HCT 42.6 08/09/2024   MCV 82.7 08/09/2024   PLT 247 08/09/2024   Lab Results  Component  Value Date   NA 138 08/09/2024   K 3.5 08/09/2024   CO2 22 08/09/2024   GLUCOSE 49 (L) 08/09/2024   BUN 14 08/09/2024   CREATININE 1.63 (H) 08/09/2024   BILITOT 0.5 08/09/2024   ALKPHOS 89 08/09/2024   AST 13 (L) 08/09/2024   ALT 8 08/09/2024   PROT 6.1 (L) 08/09/2024   ALBUMIN 3.5 08/09/2024   CALCIUM  9.3 08/09/2024    ANIONGAP 10 08/09/2024   EGFR 53 (L) 08/06/2024   GFR 60.99 09/21/2021   Lab Results  Component Value Date   CHOL 153 12/09/2021   Lab Results  Component Value Date   HDL 54 12/09/2021   Lab Results  Component Value Date   LDLCALC 74 12/09/2021   Lab Results  Component Value Date   TRIG 144 12/09/2021   Lab Results  Component Value Date   CHOLHDL 2.8 12/09/2021   Lab Results  Component Value Date   HGBA1C 7.4 (A) 08/06/2024      Assessment & Plan:   Problem List Items Addressed This Visit       Endocrine   Uncontrolled diabetes mellitus with hypoglycemia, with long-term current use of insulin  (HCC)   Lab Results  Component Value Date   HGBA1C 7.4 (A) 08/06/2024    No recent hypoglycemic episodes since hospital discharge. Freestyle Libre for glucose monitoring planned. Advised to adjust Humalog  to 30 units three times daily, decrease Toujeo  to 70 units daily as per endocrinologist's advice  and due to her recent hypoglycemic episode, rather than waiting to she starts Mounjaro  - Increase Mounjaro  to 7.5 mg  as planned. - Continue Jardiance  at 25 mg, metformin  1000 mg twice daily - Pick up and use Freestyle Libre for continuous glucose monitoring. -  app was set up for remote monitoring by the CMA  Has  a prescription for Gvoke hypopen         Nervous and Auditory   Herpes zoster without complication - Primary   Shingles (Herpes Zoster) with postherpetic neuralgia Persistent pain with electric shocks and burning sensation indicative of postherpetic neuralgia. Pain expected to improve over time. - Continue Valtrex 1000 mg twice daily for 7 days as prescribed. - Continue current pain management with oxycodone  10 mg 4 times daily as needed and Lyrica  50 mg 3 times daily - Advise to get shingles vaccine once the current episode resolves. Education materials for care of shingles provided         Genitourinary   AKI (acute kidney injury)   Lab Results   Component Value Date   NA 138 08/09/2024   K 3.5 08/09/2024   CO2 22 08/09/2024   GLUCOSE 49 (L) 08/09/2024   BUN 14 08/09/2024   CREATININE 1.63 (H) 08/09/2024   CALCIUM  9.3 08/09/2024   GFR 60.99 09/21/2021   EGFR 53 (L) 08/06/2024   GFRNONAA 39 (L) 08/09/2024  Will recheck BMP next week  -Maintain hydration      Relevant Orders   Basic Metabolic Panel     Other   Mixed hyperlipidemia   Lab Results  Component Value Date   CHOL 153 12/09/2021   HDL 54 12/09/2021   LDLCALC 74 12/09/2021   TRIG 144 12/09/2021   CHOLHDL 2.8 12/09/2021  On rosuvastatin  20 mg daily Patient told to come fasting for lipid panel at next visit      Encounter for examination following treatment at hospital   Hospital chart reviewed, including discharge summary Medications reconciled and reviewed with the patient  in detail        No orders of the defined types were placed in this encounter.   Follow-up: Return in about 3 months (around 11/15/2024) for DM.    Lael Pilch R Henrique Parekh, FNP

## 2024-08-15 NOTE — Progress Notes (Signed)
 08/15/2024 Name: ARIYAH SEDLACK MRN: [id number] DOB: [birth year]  Chief Complaint  Patient presents with   Congestive Heart Failure   Diabetes    ATHINA FAHEY is a 48 y.o. year old female who presented for a telephone visit.   They were referred to the pharmacist by their PCP for assistance in managing diabetes. PMH includes HFpEF, HTN, COPD, IBS, gastroparesis, T2DM (hx of DKA), hypoglycemia unawareness, HLD   Subjective: Patient was  dmitted at [facility] from 03/27/24 to 03/30/24 in the setting of seizure 2/2 hypoglycemia (BG of 34 mg/dL). This was a recurrent seizure, in the setting of hypoglycemia. She did have a R humerus fracture due to the seizure. She was discharged with reduced dose of insulin  (PTA was on Toujeo  U300 60 units daily and Humalog  U200 60 units TID before meals). Since then, patient's insulin  dose has been slowly titrated back up as patient has reported persistent and symptomatic hyperglycemia. Additionally, she has started Mounjaro  within the past several months. At pharmacy call on 06/27/24 patient was instructed to take Toujeo  U300 80 units once daily, Humalog  40 units TID with meals, Mounjaro  5 mg weekly, Jardiance  10 mg daily, and metformin  XR 1000 mg BID. In the meantime, we have tried to obtain a CGM for her. She was able to see Dr. Mercie (Endo) on 08/06/24. A1C was 7.4%. She was instructed to increase Mounjaro  to 7.5 mg weekly and at that time decrease Toujeo  to 70 units daily and decrease Humalog  to 30 units TID with meals. She was also instructed to increase Jardiance  to 25 mg daily and continue metformin  as prescribed.   Today, patient reports doing ok. She currently has shingles, and during ED visits has been noted to have hypoglycemia. However, patient reports she is not having hypoglycemia and has not had BG < 70 mg/dL when she checks at home. She does have hypoglycemia unawareness. Reports she plans to decreases doses of insulin  when she increases her Mounjaro  in 2-3  weeks. She did start the higher dose of Jardiance  without issue. Says she plans to pick up CGMs and Gvoke from [facility] pharmacy today - confirmed $0 copays.    Care Team: Primary Care Provider: Oley Bascom RAMAN, NP ; Next Scheduled Visit: Needs to be scheduled Cardiologist: Dr. Court; Next Scheduled Visit: 07/24/24 with NP Endocrinologist Dr. Mercie; Next Scheduled Visit: 08/06/24  Medication Access/Adherence  Current Pharmacy:  [facility] - [facility] [address], [address] [place] [address] Phone: [phone] Fax: [phone]  [id number] [place], [place] - [address] AT [facility] [address] [place] [address] Phone: [phone] Fax: [phone]  [id number] [place],  - [address] AT [facility] [address] [place] [address] Phone: [phone] Fax: [phone]  [name] [address] [place] [address] Phone: [phone] Fax: [phone]  [facility] [address] [place] [address] Phone: [phone] Fax: [phone]  [id number] [place], [place] - [address] AT [facility] OF [facility] & [address] [place] [address] Phone: [phone] Fax: [phone]   Patient reports affordability concerns with their medications: No  Patient reports access/transportation concerns to their pharmacy: No  Patient reports adherence concerns with their medications:  Yes  - she is self adjusting insulin  doses and stretching supply. Reports  she only has 1/2 pen left of Humalog .  Diabetes:  Current medications: Toujeo  U300 Max Solostar (insulin  glargine) 70 units daily (still taking 80 units - in the evening), Humalog  (insulin  lispro) 30 units three times daily before meals  (reports she is still taking 40 units TID), Jardiance  25 mg daily, metformin  XR 1000 mg BID, Mounjaro  5mg  weekly (Tuesdays)  Medications tried in the past: Victoza (nausea/vomiting) - dx of gastroparesis - however GI reviewed and patient had a normal gastric emptying study in 2022  Using Accu Chek meter; testing 2-3 times daily Has not been able to apply FL3+ - ready for pick up at [facility] pharmacy for $0. Will confirm she has app downloaded at PCP appt this afternoon.  Current glucose readings:  08/15/24: AM 386 mg/dL 89/69/74: AM 653 mg/dL, PM 814 mg/dL, PM 723 mg/dL 89/70/74: AM 816 mg/dL, PM 722 mg/dL, PM 853 mg/dL 89/71/74: AM 866 mg/dL, PM 763 mg/dL PM 863 mg/dL 89/72/74: AM 90 mg/dL, PM 856 mg/dL, PM 763 mg/dL 89/73/74: AM 703 mg/dL, PM 755 mg/dL, PM 679 mg/dL  Patient denies hypoglycemic s/sx including dizziness, shakiness, sweating - patient has hypoglycemia unawareness - had CBG of 50 > 52 in ED on 08/09/24. Patient reports hyperglycemic symptoms including polydipsia, frequent urination, fatigue. Reports that her rash is resolved  Current meal patterns: She is eating 2-3 times per day - Breakfast: oatmeal, egg. Today - two strips of bacon, eggs, toast - Lunch: PB jelly sandwich. Today - turkey sandwich. - Supper: baked pork chop and broccoli, corn - Snacks: piece of pound cake  - Drinks: drinking 4.5x 16 oz bottle of water per day, gatorade zero. Reports diet soda every once in a while.  Heart Failure with preserved EF (EF 60-65%):  Current medications:  ACEi/ARB/ARNI: losartan  25 mg daily  SGLT2i: Jardiance  10 mg daily Beta blocker: metoprolol  succinate 100 mg daily Mineralocorticoid Receptor Antagonist: spironolactone  25 mg daily Diuretic regimen: torsemide  60 mg daily (switched from furosemide  by nephrology) Potassium 20 mEq daily (last filled 03/12/24 for 30ds)  Current home weights: does not have scale - [facility] was not able to fill her rx for BP cuff and scale.    Reports that the swelling in her LE has improved since her last visit with her PCP.  Objective:  BP Readings from Last 3 Encounters:  08/15/24 122/61  08/08/24 (!) 189/87  08/06/24 126/70    Lab Results  Component Value Date   HGBA1C 7.4 (A) 08/06/2024   HGBA1C 9.2 (H) 03/27/2024   HGBA1C 8.9 (A) 02/25/2024       Latest Ref Rng & Units 08/09/2024    3:54 AM 08/06/2024    1:56 PM 07/31/2024   10:39 PM  BMP  Glucose 70 - 99 mg/dL 49  768  43   BUN 6 - 20 mg/dL 14  15  9    Creatinine 0.44 - 1.00 mg/dL 8.36  8.73  8.84   BUN/Creat Ratio 6 - 22 (calc)  12    Sodium 135 - 145 mmol/L 138  141  140   Potassium 3.5 - 5.1 mmol/L 3.5  4.2  3.6   Chloride 98 - 111 mmol/L 106  109  104   CO2 22 - 32 mmol/L 22  22  26    Calcium  8.9 - 10.3 mg/dL 9.3  89.6  89.4     Lab Results  Component Value Date   CHOL 153 12/09/2021   HDL 54 12/09/2021   LDLCALC 74 12/09/2021  TRIG 144 12/09/2021   CHOLHDL 2.8 12/09/2021    Medications Reviewed Today     Reviewed by Brinda Lorain SQUIBB, RPH (Pharmacist) on 08/15/24 at 1456  Med List Status: <None>   Medication Order Taking? Sig Documenting Provider Last Dose Status Informant  Accu-Chek Softclix Lancets lancets [id number]  Use as instructed to monitor blood sugar 4 times daily Nichols, Tonya S, NP  Active   albuterol  (PROVENTIL ) (2.5 MG/3ML) 0.083% nebulizer solution [id number]  Take 3 mLs (2.5 mg total) by nebulization every 6 (six) hours as needed for wheezing or shortness of breath. Parrett, Madelin RAMAN, NP  Active Self, Pharmacy Records  albuterol  (VENTOLIN  HFA) 108 (90 Base) MCG/ACT inhaler [id number]  Inhale 2 puffs into the lungs every 6 (six) hours as needed for wheezing or shortness of breath. Oley Bascom RAMAN, NP  Active Self, Pharmacy Records  Blood Glucose Monitoring Suppl (ACCU-CHEK GUIDE) w/Device KIT [id number]  Use as directed to monitor blood sugar 4 times daily (before each meal and before bed). Nichols, Tonya S, NP  Active    budesonide -formoterol  (SYMBICORT ) 160-4.5 MCG/ACT inhaler [id number]  Inhale 2 puffs into the lungs 2 (two) times daily. Oley Bascom RAMAN, NP  Active Self, Pharmacy Records           Med Note JACKOLYN WADDELL VEAR [place] Mar 27, 2024  7:47 AM)    busPIRone  (BUSPAR ) 10 MG tablet [id number]  Take 1 tablet (10 mg total) by mouth 2 (two) times daily. Oley Bascom RAMAN, NP  Active Self, Pharmacy Records  Continuous Glucose Sensor (FREESTYLE LIBRE 3 PLUS SENSOR) [place] [id number]  Change sensor every 15 days.  Patient not taking: Reported on 08/15/2024   Oley Bascom RAMAN, NP  Active   Continuous Glucose Sensor (FREESTYLE LIBRE 3 PLUS SENSOR) [place] [id number]  Change every 15 days.  Patient not taking: Reported on 08/15/2024   Thapa, Sudan, MD  Active   Dexlansoprazole  30 MG capsule DR [id number]  Take 1 capsule (30 mg total) by mouth daily. NEEDS OFFICE VISIT FOR ADDITIONAL REFILLS Oley Bascom RAMAN, NP  Active   diclofenac  Sodium (VOLTAREN ) 1 % GEL [id number]  Apply 2 to 4 gram to painful sites up to 4 times daily if needed, max daily dose: 32 Gram   Active Self, Pharmacy Records  diclofenac  Sodium (VOLTAREN ) 1 % GEL [id number]  apply 2 to 4 gram to painful sites up to 4 times daily if needed, max daily dose: 32 Gram   Active      Discontinued 08/15/24 1450 (Patient Preference)      Discontinued 08/15/24 1450      Discontinued 08/15/24 1450 (Patient Preference)   dicyclomine  (BENTYL ) 20 MG tablet [id number]  Take 1 tablet (20 mg total) by mouth 2 (two) times daily. Oley Bascom RAMAN, NP  Active Self, Pharmacy Records  empagliflozin  (JARDIANCE ) 25 MG TABS tablet [id number]  Take 1 tablet (25 mg total) by mouth daily before breakfast. Thapa, Sudan, MD  Active   ferrous sulfate  325 (65 FE) MG tablet [id number]  Take 1 tablet (325 mg total) by mouth daily with breakfast. Franchot Novel, MD  Active   glucose blood (ACCU-CHEK GUIDE TEST) test strip [id number]  Use as instructed to monitor blood sugar 4 times daily Nichols,  Tonya S, NP  Active   GVOKE HYPOPEN  2-PACK 1 MG/0.2ML SOAJ [id number]  Inject 1 mg into the skin as needed (severe hypoglycemia with not able to take oral medication or severe alterned mental status / unconcious.). Thapa, Sudan, MD  Active   hydrALAZINE  (APRESOLINE ) 25 MG tablet [id number]  Take 1 tablet (25 mg total) by mouth 3 (three) times daily. Oley Bascom RAMAN, NP  Active   insulin  glargine, 2 Unit Dial , (TOUJEO  MAX SOLOSTAR) 300 UNIT/ML Solostar Pen [id number]  Inject 70 Units into the skin daily. May increase up to 100 units daily if instructed by your provider. Thapa, Sudan, MD  Active   insulin  lispro (HUMALOG  KWIKPEN) 100 UNIT/ML KwikPen [id number]  Inject 30 Units into the skin with breakfast, with lunch, and with evening meal. May increase up to 40 units with each meal if instructed by your provider. Thapa, Sudan, MD  Active   Insulin  Pen Needle (PEN NEEDLES) 31G X 5 MM MISC [id number]  Use as instructed to inject insulin  four times day Nichols, Tonya S, NP  Active   levocetirizine (XYZAL) 5 MG tablet [id number]  Take 5 mg by mouth every evening. [provider]  Active Self, Pharmacy Records  losartan  (COZAAR ) 25 MG tablet [id number]  1 tab by mouth daily   Active   Melatonin 10 MG TABS [id number]  Take 10 mg by mouth at bedtime as needed (insomnia).  Patient not taking: Reported on 08/15/2024   Patsy Lenis, MD  Active Self, Pharmacy Records  metFORMIN  (GLUCOPHAGE -XR) 500 MG 24 hr tablet [id number]  Take 2 tablets (1,000 mg total) by mouth 2 (two) times daily with a meal. [name], [name], FNP  Active   metoCLOPramide  (REGLAN ) 10 MG tablet [id number]  Take 1 tablet (10 mg total) by mouth every 8 (eight) hours as needed for nausea. Carita Senior, MD  Active Self, Pharmacy Records  metoprolol  succinate (TOPROL -XL) 100 MG 24 hr tablet [id number]  Take 1 tablet (100 mg total) by mouth daily. Take with or immediately following a meal. Oley Bascom RAMAN, NP  Active   naloxone  (NARCAN )  nasal spray 4 mg/0.1 mL [id number]  Insert 1 spray into one nostril if poorly responding / turning blue. CALL 911 ASAP   Active Self, Pharmacy Records  nitroGLYCERIN  (NITROSTAT ) 0.4 MG SL tablet [id number]  Place 1 tablet (0.4 mg total) under the tongue every 5 (five) minutes as needed for chest pain. Additional refills to be filled by PCP, patient aware Oley Bascom RAMAN, NP  Active   norethindrone  (AYGESTIN ) 5 MG tablet [id number]  Take 1 tablet (5 mg total) by mouth 2 (two) times daily as needed (bleeding). Ajewole, Christana, MD  Active   ondansetron  (ZOFRAN -ODT) 4 MG disintegrating tablet [id number]  Take 1 tablet (4 mg total) by mouth every 8 (eight) hours as needed for nausea or vomiting. Oley Bascom RAMAN, NP  Active   Oxycodone  HCl 10 MG TABS [id number]  Take 1 tablet (10 mg total) by mouth 4 (four) times daily as needed for pain   Active   Oxycodone  HCl 10 MG TABS [id number]  Take 1 tablet (10 mg total) by mouth 4 (four) times daily if needed for pain   Active   Oxycodone  HCl 10 MG TABS [id number]  Take 1 tablet (10 mg total) by mouth 4 (four) times daily as needed for pain 06/20/24   Active   Oxycodone  HCl 10 MG TABS [id number]  Take 1 tablet (10 mg total) by mouth 4 (four) times daily as needed for pain.   Active   potassium chloride  SA (KLOR-CON  M) 20 MEQ tablet [id number]  Take 1 tablet (20 mEq total) by mouth daily. Oley Bascom RAMAN, NP  Active Self, Pharmacy Records  predniSONE  [facility] UNI-PAK 21 TAB)  10 MG (21) TBPK tablet [id number]  Take by mouth as directed on the package for 6 days  Patient not taking: Reported on 08/15/2024   Lorilee Sven SQUIBB, MD  Active   pregabalin  (LYRICA ) 150 MG capsule [id number]  Take 1 capsule (150 mg total) by mouth 3 (three) times daily. Lorilee Sven SQUIBB, MD  Active   ramelteon  (ROZEREM ) 8 MG tablet [id number]  Take 1 tablet (8 mg total) by mouth at bedtime. Ezzard Staci SAILOR, NP  Active   rosuvastatin  (CRESTOR ) 20 MG tablet [id number]  Take 1 tablet (20 mg total)  by mouth daily. KEEP OV. Patwardhan, Newman PARAS, MD  Active   senna (SENOKOT) 8.6 MG TABS tablet [id number]  Take 1 tablet (8.6 mg total) by mouth daily. Franchot Novel, MD  Active   spironolactone  (ALDACTONE ) 25 MG tablet [id number]  Take 1 tablet (25 mg total) by mouth daily. Oley Bascom RAMAN, NP  Active   sucralfate  (CARAFATE ) 1 g tablet [id number]  Take 1 tablet (1 g total) by mouth 4 (four) times daily -  with meals and at bedtime. Zehr, Jessica D, PA-C  Active Self, Pharmacy Records  tirzepatide  (MOUNJARO ) 7.5 MG/0.5ML Pen [id number]  Inject 7.5 mg into the skin once a week. Thapa, Sudan, MD  Active   tizanidine  (ZANAFLEX ) 6 MG capsule [id number]  Take 1 capsule (6 mg total) by mouth 3 (three) times daily. Lorilee Sven SQUIBB, MD  Active   torsemide  (DEMADEX ) 20 MG tablet [id number]  Take 3 tablets (60 mg total) by mouth in the morning for swelling.   Active   valACYclovir (VALTREX) 1000 MG tablet [id number]  Take 1 tablet (1,000 mg total) by mouth 2 (two) times daily for 7 days. Sponseller, Pleasant SAUNDERS, PA-C  Active               Assessment/Plan:    Diabetes: - Currently controlled with most recent A1C of 7.4% below goal <8% given recurrent hypoglycemia with seizures. Could consider lower A1C goal if we are able to achieve it without hypoglycemia, however given recent ED visits with incidental hypoglycemia it appears patient continues to have hypoglycemia unawareness. Strongly encouraged her to pick up CGM today. 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 She continues to tolerate Mounjaro , appropriate to titrate at next fill, but need to monitor BG closely, ideally with CGM. Encouraged patient to continue following with endocrinology. - Last UACR 02/25/24: 146 mg/g. Patient is treated with SGLT2i - need to continue to monitor closely for development of GU infections given risk factors of female sex, obesity, and poor glycemic control.  - Reviewed long  term cardiovascular and renal outcomes of uncontrolled blood sugar - Reviewed goal A1c, goal fasting, and goal 2 hour post prandial glucose - Reviewed hypoglycemia management plan and the rule of 15 - Reviewed dietary modifications including  utilizing the healthy plate method, limiting portion size of carbohydrate foods, increasing intake of protein and non-starchy vegetables. Counseled patient to stay hydrated with water throughout the day. - Recommend to continue Jardiance  25 mg daily - Recommend to continue metformin  XR to 1000 mg BID. Continue to monitor eGFR to ensure it stays > 45 mL/min - Recommend to decrease Toujeo  U300 to 70 units once daily in the evening as instructed by Endo - Recommend to decrease Humalog  to 30 units TID with meals as instructed by Endo - Recommend to INCREASE Mounjaro  to 7.5 mg weekly at next fill as instructed by Endo -  Recommend to drink water only and avoid sugar containing beverages - Recommend to eat a small meal that contains protein three time daily, regardless of blood sugar.  - Encouraged patient to pick up FL3+ from [facility] pharmacy ($0 copay) - communicated with provider who is seeing pt at [facility] today to help make sure app is downloaded - Next A1C due 11/07/23   Patient verbalized understanding of treatment plan.   Follow Up Plan:  PCP needs to be scheduled Pharmacist telephone 10/03/24 Endo 11/06/24   Lorain Baseman, PharmD [facility] [phone]

## 2024-08-15 NOTE — Assessment & Plan Note (Signed)
 Lab Results  Component Value Date   CHOL 153 12/09/2021   HDL 54 12/09/2021   LDLCALC 74 12/09/2021   TRIG 144 12/09/2021   CHOLHDL 2.8 12/09/2021  On rosuvastatin  20 mg daily Patient told to come fasting for lipid panel at next visit

## 2024-08-15 NOTE — Assessment & Plan Note (Addendum)
 Lab Results  Component Value Date   NA 138 08/09/2024   K 3.5 08/09/2024   CO2 22 08/09/2024   GLUCOSE 49 (L) 08/09/2024   BUN 14 08/09/2024   CREATININE 1.63 (H) 08/09/2024   CALCIUM  9.3 08/09/2024   GFR 60.99 09/21/2021   EGFR 53 (L) 08/06/2024   GFRNONAA 39 (L) 08/09/2024  Will recheck BMP next week  -Maintain hydration

## 2024-08-15 NOTE — Assessment & Plan Note (Addendum)
 Shingles (Herpes Zoster) with postherpetic neuralgia Persistent pain with electric shocks and burning sensation indicative of postherpetic neuralgia. Pain expected to improve over time. - Continue Valtrex 1000 mg twice daily for 7 days as prescribed. - Continue current pain management with oxycodone  10 mg 4 times daily as needed and Lyrica  50 mg 3 times daily - Advise to get shingles vaccine once the current episode resolves. Education materials for care of shingles provided

## 2024-08-15 NOTE — Patient Instructions (Addendum)
 Goal for fasting blood sugar ranges from 80 to 120 and 2 hours after any meal or at bedtime should be between 130 to 170.   Please come fasting for your next visit so we can check your cholesterol level    It is important that you exercise regularly at least 30 minutes 5 times a week as tolerated  Think about what you will eat, plan ahead. Choose  clean, green, fresh or frozen over canned, processed or packaged foods which are more sugary, salty and fatty. 70 to 75% of food eaten should be vegetables and fruit. Three meals at set times with snacks allowed between meals, but they must be fruit or vegetables. Aim to eat over a 12 hour period , example 7 am to 7 pm, and STOP after  your last meal of the day. Drink water,generally about 64 ounces per day, no other drink is as healthy. Fruit juice is best enjoyed in a healthy way, by EATING the fruit.  Thanks for choosing [facility] we consider it a privelige to serve you.

## 2024-08-18 ENCOUNTER — Other Ambulatory Visit: Payer: Self-pay

## 2024-08-19 ENCOUNTER — Telehealth: Payer: Self-pay

## 2024-08-19 ENCOUNTER — Other Ambulatory Visit: Payer: Self-pay

## 2024-08-19 NOTE — Telephone Encounter (Signed)
 Per provider pt should drink at least 64 oz of water daily. She should also come in this Friday for repeat labs. KH

## 2024-08-19 NOTE — Telephone Encounter (Signed)
-----   Message from [name] [name] sent at 08/15/2024  5:48 PM EDT ----- Please tell the patient to drink at least 64 ounces of water daily to maintain hydration, recheck BMP by Friday this week.  Lab ordered . Thanks

## 2024-08-20 ENCOUNTER — Other Ambulatory Visit: Payer: Self-pay

## 2024-08-20 ENCOUNTER — Other Ambulatory Visit (HOSPITAL_COMMUNITY): Payer: Self-pay

## 2024-08-20 MED ORDER — OXYCODONE HCL 10 MG PO TABS
10.0000 mg | ORAL_TABLET | Freq: Four times a day (QID) | ORAL | 0 refills | Status: AC | PRN
  Filled 2024-08-20 – 2024-08-22 (×3): qty 12, 3d supply, fill #0

## 2024-08-21 ENCOUNTER — Other Ambulatory Visit (HOSPITAL_COMMUNITY): Payer: Self-pay | Admitting: Student

## 2024-08-21 ENCOUNTER — Other Ambulatory Visit: Payer: Self-pay

## 2024-08-21 ENCOUNTER — Other Ambulatory Visit (HOSPITAL_COMMUNITY): Payer: Self-pay

## 2024-08-21 DIAGNOSIS — M5440 Lumbago with sciatica, unspecified side: Secondary | ICD-10-CM

## 2024-08-21 DIAGNOSIS — M5412 Radiculopathy, cervical region: Secondary | ICD-10-CM

## 2024-08-22 ENCOUNTER — Other Ambulatory Visit (HOSPITAL_COMMUNITY): Payer: Self-pay

## 2024-08-22 ENCOUNTER — Other Ambulatory Visit: Payer: Self-pay | Admitting: Nurse Practitioner

## 2024-08-22 ENCOUNTER — Other Ambulatory Visit: Payer: Self-pay

## 2024-08-23 ENCOUNTER — Other Ambulatory Visit: Payer: Self-pay

## 2024-08-23 ENCOUNTER — Other Ambulatory Visit (HOSPITAL_COMMUNITY): Payer: Self-pay

## 2024-08-25 ENCOUNTER — Other Ambulatory Visit: Payer: Self-pay

## 2024-08-25 ENCOUNTER — Telehealth (HOSPITAL_BASED_OUTPATIENT_CLINIC_OR_DEPARTMENT_OTHER): Admitting: Family

## 2024-08-25 ENCOUNTER — Other Ambulatory Visit (HOSPITAL_COMMUNITY): Payer: Self-pay

## 2024-08-25 ENCOUNTER — Ambulatory Visit (HOSPITAL_COMMUNITY): Admitting: Family

## 2024-08-25 DIAGNOSIS — G479 Sleep disorder, unspecified: Secondary | ICD-10-CM

## 2024-08-25 MED ORDER — DICLOFENAC SODIUM 1 % EX GEL
Freq: Four times a day (QID) | CUTANEOUS | 1 refills | Status: AC
  Filled 2024-08-25: qty 300, 30d supply, fill #0

## 2024-08-25 MED ORDER — QUETIAPINE FUMARATE 25 MG PO TABS: 25.0000 mg | ORAL_TABLET | Freq: Every day | ORAL | 0 refills | Status: DC

## 2024-08-25 MED ORDER — OXYCODONE HCL 10 MG PO TABS
10.0000 mg | ORAL_TABLET | Freq: Every day | ORAL | 0 refills | Status: DC
  Filled 2024-08-25 (×2): qty 150, 30d supply, fill #0
  Filled 2024-08-25: qty 15, 3d supply, fill #0
  Filled 2024-08-25: qty 135, 27d supply, fill #0

## 2024-08-25 NOTE — Progress Notes (Signed)
 Virtual Visit via Video Note  I connected with Jill Shaw on 08/25/24 at  3:30 PM EST by a video enabled telemedicine application and verified that I am speaking with the correct person using two identifiers.  Location: Patient: Home Provider: Office   I discussed the limitations of evaluation and management by telemedicine and the availability of in person appointments. The patient expressed understanding and agreed to proceed.   I discussed the assessment and treatment plan with the patient. The patient was provided an opportunity to ask questions and all were answered. The patient agreed with the plan and demonstrated an understanding of the instructions.   The patient was advised to call back or seek an in-person evaluation if the symptoms worsen or if the condition fails to improve as anticipated.  I provided 18 minutes of non-face-to-face time during this encounter.   Staci LOISE Kerns, NP   [facility] MD/PA/NP OP Progress Note  08/25/2024 3:45 PM [name]  MRN:  [id number]  Chief Complaint: Jill Shaw stated I am fighting to sleep and stay asleep most nights.    HPI: Jill Shaw 48 year old African-American female presents for medication management.  She was seen and evaluated via care agility virtual platform.  Stated recently diagnosed with shingles and has not had time to follow-up with previous medication that was initiated.  According to initial assessment patient has concerns related to ongoing concerns related to sleep disturbance.  Reported she has tried multiple medications for sleep in the past without any symptom relief.  Chart review related to medication allergy profile.  Patient has a extensive allergy/adverse reaction to multiple medications.    Documented: trazodone  unable to take due to nausea, vomiting, Ambien  also causes headaches, nausea and vomiting and gabapentin similar medication side effects.  Per previous assessment discussed initiating Rozerem  8 mg  which she reports did not help with sleep.  Stated that she had missed previous appointment with sleep study due to medical health decompensation.   As documented on previous assessment patient has not been able to tolerate melatonin, trazodone , doxepin, amitriptyline , hydroxyzine , Ambien .  Discontinue Rozerem .   Will initiate Seroquel 25 to 50 mg nightly p.o. as needed.  She reports only receiving 3 to 4 hours nightly stated  and waking up every hour on the hour.  Denied that she is currently employed.  Education provided with consideration for sleep study.  Discussed following up for additional sleeping hygiene techniques.  Patient to follow-up 2 months for adherence/tolerability.  Support, encouragement and  reassurance was provided.   Visit Diagnosis:    ICD-10-CM   1. Sleep disturbance  G47.9       Past Psychiatric History: Denied previous history related to mental illness.  Denied previous inpatient admissions.  Denied prescription for psychotropic medications previously.  No documented concerns related to suicidal or homicidal ideations.  Denied illicit drug use or substance abuse history.  Past Medical History:  Past Medical History:  Diagnosis Date   Abnormal uterine bleeding (AUB)    Arthritis    knees, hands   Atypical chest pain    cardiology--- dr elmira did cardiac cath 08-30-2021 showed minimal luminal irregularity involving LAD all other coronaries w/ normal  flow   Chronic diastolic (congestive) heart failure [facility])    cardiologist---- dr elmira;  preserved ef   Chronic iron  deficiency anemia    Chronic pain syndrome    followed by pain management--- dr lorilee   CKD (chronic kidney disease), stage III (HCC)  Diabetic gastroparesis (HCC)    Diabetic peripheral neuropathy (HCC)    Fibromyalgia    GAD (generalized anxiety disorder)    Generalized abdominal pain    GERD (gastroesophageal reflux disease)    History of acute renal failure 03/21/2023    admission in epic due to N/V/D due ot severe sepsis POA due to UTI   History of chronic gastritis    inflammatory   History of diabetic ketoacidosis    multiple admission's last 3 in epic 06/ 2022;  01/ 2022;   09/ 2021   History of seizure 01/2016   hypoglycemic seizure   History of vertebral compression fracture 12/2020   T11 -- T12 & L1   Hyperlipidemia    Hypertension    followed by pcp   Insulin  dependent type 2 diabetes mellitus (HCC)    uncontrolled,  followed by pcp   Irritable bowel syndrome with constipation    MDD (major depressive disorder)    Moderate COPD (chronic obstructive pulmonary disease) (HCC)    pulmology--- dr brenna   Moderate persistent asthma    Sickle cell trait    Vitamin D  deficiency 10/2019   Wears glasses     Past Surgical History:  Procedure Laterality Date   CATARACT EXTRACTION W/ INTRAOCULAR LENS IMPLANT Left    CESAREAN SECTION  2001   for twins   DILATION AND CURETTAGE OF UTERUS N/A 08/20/2019   Procedure: DILATATION AND CURETTAGE;  Surgeon: Starla Harland BROCKS, MD;  Location: [facility];  Service: Gynecology;  Laterality: N/A;   DILATION AND CURETTAGE OF UTERUS  05/01/2023   Procedure: DILATATION AND CURETTAGE;  Surgeon: Jeralyn Crutch, MD;  Location: [facility];  Service: Gynecology;;   ENDOMETRIAL ABLATION N/A 08/20/2019   Procedure: Lela Ablation;  Surgeon: Starla Harland BROCKS, MD;  Location: [facility];  Service: Gynecology;  Laterality: N/A;   EYE SURGERY Bilateral    laser right and cataract removed left eye   HYSTEROSCOPY N/A 05/01/2023   Procedure: HYSTEROSCOPY;  Surgeon: Jeralyn Crutch, MD;  Location: [facility];  Service: Gynecology;  Laterality: N/A;   INTRAUTERINE DEVICE (IUD) INSERTION N/A 05/01/2023   Procedure: INTRAUTERINE DEVICE (IUD) INSERTION;  Surgeon: Jeralyn Crutch, MD;  Location: [facility];  Service: Gynecology;  Laterality: N/A;   LEFT HEART CATH AND CORONARY ANGIOGRAPHY N/A  08/30/2021   Procedure: LEFT HEART CATH AND CORONARY ANGIOGRAPHY;  Surgeon: Elmira Newman PARAS, MD;  Location: [facility];  Service: Cardiovascular;  Laterality: N/A;   RADIOLOGY WITH ANESTHESIA N/A 09/16/2019   Procedure: MRI WITH ANESTHESIA   L SPINE WITHOUT CONTRAST, T SPINE WITHOUT CONTRAST , CERVICAL WITHOUT CONTRAST;  Surgeon: Radiologist, Medication, MD;  Location: [facility];  Service: Radiology;  Laterality: N/A;   TOOTH EXTRACTION N/A 01/14/2024   Procedure: DENTAL RESTORATION/EXTRACTIONS;  Surgeon: Sheryle Hamilton, DMD;  Location: [facility];  Service: Oral Surgery;  Laterality: N/A;   TUBAL LIGATION     interval BTL   UPPER GI ENDOSCOPY  07/2017    Family Psychiatric History:   Family History:  Family History  Problem Relation Age of Onset   Diabetes Mother    Hypertension Mother    Colon cancer Maternal Grandfather    Migraines Paternal Grandfather    Colon cancer Maternal Aunt    Breast cancer Maternal Aunt    Esophageal cancer Neg Hx    Stomach cancer Neg Hx    Rectal cancer Neg Hx     Social History:  Social  History   Socioeconomic History   Marital status: Divorced    Spouse name: Not on file   Number of children: 3   Years of education: Not on file   Highest education level: Not on file  Occupational History   Occupation: unemployed  Tobacco Use   Smoking status: Some Days    Current packs/day: 0.00    Average packs/day: 0.3 packs/day for 26.0 years (6.5 ttl pk-yrs)    Types: Cigarettes    Start date: 09/13/1994    Last attempt to quit: 09/13/2020    Years since quitting: 3.9   Smokeless tobacco: Never   Tobacco comments:    Pt smokes 1/2 ppd. AB. CMA 09-10-23  Vaping Use   Vaping status: Never Used  Substance and Sexual Activity   Alcohol use: No   Drug use: No   Sexual activity: Not Currently    Birth control/protection: I.U.D.  Other Topics Concern   Not on file  Social History Narrative   Right Handed   Lives in a one story apartment, but  lives on the second floor   Drinks caffeine  once in awhile   Social Drivers of Health   Financial Resource Strain: Low Risk  (03/18/2023)   Overall Financial Resource Strain (CARDIA)    Difficulty of Paying Living Expenses: Not very hard  Food Insecurity: No Food Insecurity (03/27/2024)   Hunger Vital Sign    Worried About Running Out of Food in the Last Year: Never true    Ran Out of Food in the Last Year: Never true  Transportation Needs: No Transportation Needs (03/27/2024)   PRAPARE - Administrator, Civil Service (Medical): No    Lack of Transportation (Non-Medical): No  Physical Activity: Inactive (03/18/2023)   Exercise Vital Sign    Days of Exercise per Week: 0 days    Minutes of Exercise per Session: 0 min  Stress: Stress Concern Present (03/18/2023)   [employer] of Occupational Health - Occupational Stress Questionnaire    Feeling of Stress : To some extent  Social Connections: Moderately Isolated (02/04/2024)   Social Connection and Isolation Panel    Frequency of Communication with Friends and Family: More than three times a week    Frequency of Social Gatherings with Friends and Family: Three times a week    Attends Religious Services: 1 to 4 times per year    Active Member of Clubs or Organizations: No    Attends Banker Meetings: Never    Marital Status: Separated    Allergies:  Allergies  Allergen Reactions   Elavil  [Amitriptyline ] Other (See Comments)    Coma   Orudis [Ketoprofen] Nausea And Vomiting   Desyrel  [Trazodone ] Nausea And Vomiting   Tylenol  [Acetaminophen ] Nausea And Vomiting   Ambien  [Zolpidem  Tartrate] Nausea And Vomiting    She also reported headache   Aspirin  Nausea Only   Hydroxyzine  Hcl Rash   Motrin  [Ibuprofen ] Nausea And Vomiting   Naprosyn [Naproxen] Nausea And Vomiting   Neurontin [Gabapentin] Nausea And Vomiting and Other (See Comments)    upset stomach   Sulfa Antibiotics Nausea And Vomiting   Ultram   [Tramadol ] Nausea And Vomiting and Other (See Comments)    stomach upset   Victoza [Liraglutide] Nausea And Vomiting   Zegerid [Omeprazole -Sodium Bicarbonate ] Nausea And Vomiting    Metabolic Disorder Labs: Lab Results  Component Value Date   HGBA1C 7.4 (A) 08/06/2024   MPG 217.34 03/27/2024   MPG 188.64 07/29/2023   No  results found for: PROLACTIN Lab Results  Component Value Date   CHOL 153 12/09/2021   TRIG 144 12/09/2021   HDL 54 12/09/2021   CHOLHDL 2.8 12/09/2021   VLDL 29 03/09/2017   LDLCALC 74 12/09/2021   LDLCALC 48 07/29/2021   Lab Results  Component Value Date   TSH 1.813 07/30/2023   TSH 2.330 07/29/2021    Therapeutic Level Labs: No results found for: LITHIUM No results found for: VALPROATE No results found for: CBMZ  Current Medications: Current Outpatient Medications  Medication Sig Dispense Refill   QUEtiapine (SEROQUEL) 25 MG tablet Take 1 tablet (25 mg total) by mouth at bedtime. 30 tablet 0   Accu-Chek Softclix Lancets lancets Use as instructed to monitor blood sugar 4 times daily 300 each 4   albuterol  (PROVENTIL ) (2.5 MG/3ML) 0.083% nebulizer solution Take 3 mLs (2.5 mg total) by nebulization every 6 (six) hours as needed for wheezing or shortness of breath. 75 mL 2   albuterol  (VENTOLIN  HFA) 108 (90 Base) MCG/ACT inhaler Inhale 2 puffs into the lungs every 6 (six) hours as needed for wheezing or shortness of breath. 8.5 g 3   Blood Glucose Monitoring Suppl (ACCU-CHEK GUIDE) w/Device KIT Use as directed to monitor blood sugar 4 times daily (before each meal and before bed). 1 kit 0   budesonide -formoterol  (SYMBICORT ) 160-4.5 MCG/ACT inhaler Inhale 2 puffs into the lungs 2 (two) times daily. 10.2 g 5   busPIRone  (BUSPAR ) 10 MG tablet Take 1 tablet (10 mg total) by mouth 2 (two) times daily. 60 tablet 2   Continuous Glucose Sensor (FREESTYLE LIBRE 3 PLUS SENSOR) MISC Change sensor every 15 days. (Patient not taking: Reported on 08/15/2024) 2  each 11   Continuous Glucose Sensor (FREESTYLE LIBRE 3 PLUS SENSOR) MISC Change every 15 days. (Patient not taking: Reported on 08/15/2024) 6 each 3   Dexlansoprazole  30 MG capsule DR Take 1 capsule (30 mg total) by mouth daily. NEEDS OFFICE VISIT FOR ADDITIONAL REFILLS 90 capsule 1   diclofenac  Sodium (VOLTAREN ) 1 % GEL Apply 2 to 4 gram to painful sites up to 4 times daily if needed, max daily dose: 32 Gram 300 g 1   diclofenac  Sodium (VOLTAREN ) 1 % GEL apply 2 to 4 gram to painful sites up to 4 times daily if needed, max daily dose: 32 Gram 300 g 1   diclofenac  Sodium (VOLTAREN ) 1 % GEL Apply 2-4 grams topically up to 4 (four) times daily as needed. max 32 gm daily 300 g 1   dicyclomine  (BENTYL ) 20 MG tablet Take 1 tablet (20 mg total) by mouth 2 (two) times daily. 60 tablet 3   empagliflozin  (JARDIANCE ) 25 MG TABS tablet Take 1 tablet (25 mg total) by mouth daily before breakfast. 90 tablet 3   ferrous sulfate  325 (65 FE) MG tablet Take 1 tablet (325 mg total) by mouth daily with breakfast. 30 tablet 0   glucose blood (ACCU-CHEK GUIDE TEST) test strip Use as instructed to monitor blood sugar 4 times daily 300 each 4   GVOKE HYPOPEN  2-PACK 1 MG/0.2ML SOAJ Inject 1 mg into the skin as needed (severe hypoglycemia with not able to take oral medication or severe alterned mental status / unconcious.). 0.4 mL 1   hydrALAZINE  (APRESOLINE ) 25 MG tablet Take 1 tablet (25 mg total) by mouth 3 (three) times daily. 90 tablet 0   insulin  glargine, 2 Unit Dial , (TOUJEO  MAX SOLOSTAR) 300 UNIT/ML Solostar Pen Inject 70 Units into the skin daily. May  increase up to 100 units daily if instructed by your provider. 18 mL 5   insulin  lispro (HUMALOG  KWIKPEN) 100 UNIT/ML KwikPen Inject 30 Units into the skin with breakfast, with lunch, and with evening meal. May increase up to 40 units with each meal if instructed by your provider. 36 mL 5   Insulin  Pen Needle (PEN NEEDLES) 31G X 5 MM MISC Use as instructed to inject  insulin  four times day 200 each 3   levocetirizine (XYZAL) 5 MG tablet Take 5 mg by mouth every evening.     losartan  (COZAAR ) 25 MG tablet 1 tab by mouth daily 90 tablet 3   Melatonin 10 MG TABS Take 10 mg by mouth at bedtime as needed (insomnia). (Patient not taking: Reported on 08/15/2024) 30 tablet 0   metFORMIN  (GLUCOPHAGE -XR) 500 MG 24 hr tablet Take 2 tablets (1,000 mg total) by mouth 2 (two) times daily with a meal. 360 tablet 1   metoCLOPramide  (REGLAN ) 10 MG tablet Take 1 tablet (10 mg total) by mouth every 8 (eight) hours as needed for nausea. 20 tablet 0   metoprolol  succinate (TOPROL -XL) 100 MG 24 hr tablet Take 1 tablet (100 mg total) by mouth daily. Take with or immediately following a meal. 90 tablet 0   naloxone  (NARCAN ) nasal spray 4 mg/0.1 mL Insert 1 spray into one nostril if poorly responding / turning blue. CALL 911 ASAP 2 each 0   nitroGLYCERIN  (NITROSTAT ) 0.4 MG SL tablet Place 1 tablet (0.4 mg total) under the tongue every 5 (five) minutes as needed for chest pain. Additional refills to be filled by PCP, patient aware 25 tablet 0   norethindrone  (AYGESTIN ) 5 MG tablet Take 1 tablet (5 mg total) by mouth 2 (two) times daily as needed (bleeding). 90 tablet 8   ondansetron  (ZOFRAN -ODT) 4 MG disintegrating tablet Take 1 tablet (4 mg total) by mouth every 8 (eight) hours as needed for nausea or vomiting. 20 tablet 0   Oxycodone  HCl 10 MG TABS Take 1 tablet (10 mg total) by mouth 4 (four) times daily as needed for pain 120 tablet 0   Oxycodone  HCl 10 MG TABS Take 1 tablet (10 mg total) by mouth 4 (four) times daily if needed for pain 120 tablet 0   Oxycodone  HCl 10 MG TABS Take 1 tablet (10 mg total) by mouth 4 (four) times daily as needed for pain 06/20/24 12 tablet 0   Oxycodone  HCl 10 MG TABS Take 1 tablet (10 mg total) by mouth 4 (four) times daily as needed for pain 12 tablet 0   Oxycodone  HCl 10 MG TABS Take 1 tablet (10 mg total) by mouth 5 (five) times daily as needed for  pain 150 tablet 0   potassium chloride  SA (KLOR-CON  M) 20 MEQ tablet Take 1 tablet (20 mEq total) by mouth daily. 30 tablet 0   pregabalin  (LYRICA ) 150 MG capsule Take 1 capsule (150 mg total) by mouth 3 (three) times daily. 270 capsule 3   rosuvastatin  (CRESTOR ) 20 MG tablet Take 1 tablet (20 mg total) by mouth daily. KEEP OV. 90 tablet 0   senna (SENOKOT) 8.6 MG TABS tablet Take 1 tablet (8.6 mg total) by mouth daily. 120 tablet 0   spironolactone  (ALDACTONE ) 25 MG tablet Take 1 tablet (25 mg total) by mouth daily. 90 tablet 0   sucralfate  (CARAFATE ) 1 g tablet Take 1 tablet (1 g total) by mouth 4 (four) times daily -  with meals and at bedtime. 120 tablet  3   tirzepatide  (MOUNJARO ) 7.5 MG/0.5ML Pen Inject 7.5 mg into the skin once a week. 6 mL 4   tizanidine  (ZANAFLEX ) 6 MG capsule Take 1 capsule (6 mg total) by mouth 3 (three) times daily. 90 capsule 3   torsemide  (DEMADEX ) 20 MG tablet Take 3 tablets (60 mg total) by mouth in the morning for swelling. 270 tablet 3   No current facility-administered medications for this visit.     Musculoskeletal:   Psychiatric Specialty Exam: Review of Systems  There were no vitals taken for this visit.There is no height or weight on file to calculate BMI.  General Appearance: Casual  Eye Contact:  Good  Speech:  Clear and Coherent  Volume:  Normal  Mood:  Anxious and Depressed  Affect:  Congruent  Thought Process:  Coherent  Orientation:  Full (Time, Place, and Person)  Thought Content: Logical   Suicidal Thoughts:  No  Homicidal Thoughts:  No  Memory:  Immediate;   Good Recent;   Good  Judgement:  Good  Insight:  Good  Psychomotor Activity:  Normal  Concentration:  Concentration: Good  Recall:  Good  Fund of Knowledge: Good  Language: Good  Akathisia:  No  Handed:  Right  AIMS (if indicated): not done  Assets:  Communication Skills Desire for Improvement  ADL's:  Intact  Cognition: WNL  Sleep:  Poor broken 3 hours of sleep    Screenings: GAD-7    Flowsheet Row Office Visit from 08/15/2024 in [facility] Office Visit from [facility]/20[facility] Office Visit from 09/05/2022 in [facility] Office Visit from 07/25/2021 in [facility] Clinical Support from 04/15/2021 in [facility]  Total GAD-7 Score 10 10 0 12 4   PHQ2-9    Flowsheet Row Office Visit from 08/15/2024 in [facility] Office Visit from 07/29/2024 in [facility] Office Visit from 02/14/2024 in [facility] Office Visit from 12/21/2023 in [facility] Office Visit from 07/26/2023 in [facility]  PHQ-2 Total Score 6 4 0 0 0  PHQ-9 Total Score 21 13 -- -- --   Flowsheet Row ED from 08/09/2024 in [facility] ED from 07/31/2024 in [facility] at [facility] ED to Hosp-Admission (Discharged) from 03/27/2024 in [facility]  C-SSRS RISK CATEGORY No Risk No Risk No Risk     Assessment and Plan:  Jill Shaw 48 year old African-American female presents for medication management follow-up appointment.  Was referred due to sleep disturbance by primary care.  Was reported patient is unable to tolerate multiple medications related to sleep.  She was initiated on Rozerem  8 mg nightly for sleep disturbance.  She reports medication did not help with her symptoms as she states she only was able to get 3 hours of rest.  Discussed initiating Seroquel 25 to 50 mg nightly as needed follow-up 2  weeks for medication adherence/tolerability.   Sleep disturbance: Patient to start Seroquel 25 to 50 mg nightly Discontinue Rozerem  Referral placed for sleep  study  Collaboration of Care: Collaboration of Care: Medication Management AEB patient to start Seroquel   Patient/Guardian was advised Release of Information must be obtained prior to any record release in order to collaborate their care with an outside provider. Patient/Guardian was advised if they have not already done so to contact the registration department to sign all necessary forms in order for us  to release information regarding their care.   Consent: Patient/Guardian gives verbal consent for treatment and assignment of benefits for services provided during this visit. Patient/Guardian expressed understanding and agreed to proceed.    Staci LOISE Kerns, NP 08/25/2024, 3:45 PM

## 2024-08-26 ENCOUNTER — Other Ambulatory Visit: Payer: Self-pay

## 2024-08-26 ENCOUNTER — Other Ambulatory Visit (HOSPITAL_COMMUNITY): Payer: Self-pay

## 2024-08-26 DIAGNOSIS — I10 Essential (primary) hypertension: Secondary | ICD-10-CM

## 2024-08-26 DIAGNOSIS — E1165 Type 2 diabetes mellitus with hyperglycemia: Secondary | ICD-10-CM

## 2024-08-26 MED ORDER — BMI DIGITAL SMART SCALE MISC: 1.0000 | Freq: Every day | 0 refills | Status: DC

## 2024-08-26 MED ORDER — BLOOD PRESSURE MONITORING KIT: 1.0000 | PACK | 0 refills | Status: DC

## 2024-08-26 MED ORDER — BLOOD PRESSURE MONITORING KIT
1.0000 | PACK | 0 refills | Status: DC
Start: 2024-08-27 — End: 2024-08-26

## 2024-08-26 NOTE — Telephone Encounter (Signed)
 Please advise [facility]

## 2024-08-26 NOTE — Telephone Encounter (Signed)
Done kh

## 2024-08-27 ENCOUNTER — Ambulatory Visit: Payer: Self-pay

## 2024-08-27 ENCOUNTER — Other Ambulatory Visit: Payer: Self-pay

## 2024-08-27 MED ORDER — HYDRALAZINE HCL 25 MG PO TABS
25.0000 mg | ORAL_TABLET | Freq: Three times a day (TID) | ORAL | 0 refills | Status: DC
  Filled 2024-08-27: qty 90, 30d supply, fill #0

## 2024-08-27 NOTE — Telephone Encounter (Signed)
 FYI Only or Action Required?: Action required by provider: request for appointment and clinical question for provider.  Patient was last seen in primary care on 08/15/2024 by [name], [name], FNP.  Called Nurse Triage reporting Herpes Zoster.  Symptoms began about a month ago.  Interventions attempted: Prescription medications: patient has completed a round of antiviral medication.  Symptoms are: pain is unchanged.  Triage Disposition: See PCP Within 2 Weeks  Patient/caregiver understands and will follow disposition?: No, wishes to speak with PCP     Copied from CRM [id number]. Topic: Clinical - Red Word Triage >> Aug 27, 2024  2:29 PM Amy B wrote: Red Word that prompted transfer to Nurse Triage: Left leg pain due to shingles, weakness      Reason for Disposition  Pain persisting > 1 month after rash disappears  Answer Assessment - Initial Assessment Questions Patient is concerned about her shingles and flu shot due to still experiencing pain from her recent shingles. Patient is requesting someone to call her back to discuss this. Please advise.   Patient is also requesting an appointment if one becomes available, stating she didn't want to wait to be seen until next month. Please advise.      1. APPEARANCE of RASH: What does the rash look like?      Scaly  2. LOCATION: Where is the rash located?      Left leg  3. ONSET: When did the rash start?      About a month ago  4. ITCHING: Does the rash itch? If Yes, ask: How bad is the itch?  (Scale 1-10; or mild, moderate, severe)     Moderate  5. PAIN: Does the rash hurt? If Yes, ask: How bad is the pain?  (Scale 0-10; or none, mild, moderate, severe)     Moderate to severe  6. OTHER SYMPTOMS: Do you have any other symptoms? (e.g., fever)     No  Protocols used: Shingles (Zoster)-A-AH

## 2024-08-27 NOTE — Telephone Encounter (Signed)
 Disconnected [place] transfer to this [facility]. Made 1st attempt to reach patient, left voicemail with callback number.  Copied from CRM [id number]. Topic: Clinical - Red Word Triage >> Aug 27, 2024  2:29 PM Amy B wrote: Red Word that prompted transfer to Nurse Triage: Left leg pain due to shingles, weakness

## 2024-09-01 ENCOUNTER — Ambulatory Visit: Payer: Self-pay

## 2024-09-01 ENCOUNTER — Other Ambulatory Visit: Payer: Self-pay | Admitting: Physical Medicine and Rehabilitation

## 2024-09-01 ENCOUNTER — Ambulatory Visit (HOSPITAL_COMMUNITY): Admitting: Family

## 2024-09-01 ENCOUNTER — Other Ambulatory Visit: Payer: Self-pay

## 2024-09-01 ENCOUNTER — Telehealth: Payer: Self-pay

## 2024-09-01 NOTE — Telephone Encounter (Signed)
 Patient is requesting to have the Pregabalin  dose increased. She is still having pain from the shingles.

## 2024-09-01 NOTE — Telephone Encounter (Signed)
 FYI Only or Action Required?: FYI only for provider: appointment scheduled on 11/21.  Patient was last seen in primary care on 08/15/2024 by [name], [name], FNP.  Called Nurse Triage reporting Herpes Zoster.  Symptoms began 1-2 months ago.  Interventions attempted: Prescription medications: Valtrex.  Symptoms are: unchanged.  Triage Disposition: Home Care  Patient/caregiver understands and will follow disposition?: Yes with modifications. Appointment scheduled due to continued symptoms after finishing antiviral medication      Copied from CRM #[id number]. Topic: Clinical - Red Word Triage >> Sep 01, 2024  1:49 PM [name] wrote: [facility] that prompted transfer to Nurse Triage: Patient has severe pain in left leg and has weakness going on      Reason for Disposition  [1] Shingles rash already diagnosed and [2] taking antiviral medication  Answer Assessment - Initial Assessment Questions Patient finished Valtrex course but symptoms are still present. Appointment scheduled for 11/21   1. APPEARANCE of RASH: What does the rash look like?      Dry scaly rash  2. LOCATION: Where is the rash located?      Left leg  3. ONSET: When did the rash start?      1-2 months ago  4. ITCHING: Does the rash itch? If Yes, ask: How bad is the itch?  (Scale 1-10; or mild, moderate, severe)     Moderate to severe  5. PAIN: Does the rash hurt? If Yes, ask: How bad is the pain?  (Scale 0-10; or none, mild, moderate, severe)     Moderate to severe  6. OTHER SYMPTOMS: Do you have any other symptoms? (e.g., fever)     No  Protocols used: Shingles (Zoster)-A-AH

## 2024-09-02 ENCOUNTER — Other Ambulatory Visit: Payer: Self-pay

## 2024-09-02 ENCOUNTER — Other Ambulatory Visit (HOSPITAL_COMMUNITY): Payer: Self-pay

## 2024-09-04 ENCOUNTER — Other Ambulatory Visit: Payer: Self-pay

## 2024-09-04 NOTE — Telephone Encounter (Unsigned)
 Copied from CRM [id number]. Topic: General - Other >> Sep 04, 2024 11:53 AM Jill Shaw wrote: Reason for CRM: Patient is calling to cancel her appointment for tomorrow 09/05/24. Triaged and scheduled for Shingles on left leg for 1-2 months, pain and itching.

## 2024-09-04 NOTE — Telephone Encounter (Signed)
 Patient is calling to cancel appointment- she is unable to make the appointment. Patient is not taking any medication for shingles now- patient is having pain. Patient advised to be seen sooner if her symptoms get worse- appointment has been rescheduled to 11/25.

## 2024-09-05 ENCOUNTER — Ambulatory Visit: Payer: Self-pay | Admitting: Nurse Practitioner

## 2024-09-08 ENCOUNTER — Other Ambulatory Visit (HOSPITAL_COMMUNITY): Payer: Self-pay

## 2024-09-08 ENCOUNTER — Encounter: Payer: Self-pay | Admitting: Nurse Practitioner

## 2024-09-08 ENCOUNTER — Ambulatory Visit: Admitting: Nurse Practitioner

## 2024-09-08 VITALS — BP 149/69 | HR 93 | Wt 290.4 lb

## 2024-09-08 DIAGNOSIS — R21 Rash and other nonspecific skin eruption: Secondary | ICD-10-CM

## 2024-09-08 DIAGNOSIS — Z23 Encounter for immunization: Secondary | ICD-10-CM | POA: Diagnosis not present

## 2024-09-08 NOTE — Progress Notes (Signed)
 Subjective   Patient ID: Jill Shaw, female    DOB: 1976/03/19, 48 y.o.   MRN: [id number]  Chief Complaint  Patient presents with   Herpes Zoster   Pain   Pruritis   burning sensation    Referring provider: Oley Bascom RAMAN, NP  Jill Shaw is a 47 y.o. female with Past Medical History: No date: Abnormal uterine bleeding (AUB) No date: Arthritis     Comment:  knees, hands No date: Atypical chest pain     Comment:  cardiology--- dr elmira did cardiac cath 08-30-2021               showed minimal luminal irregularity involving LAD all               other coronaries w/ normal  flow No date: Chronic diastolic (congestive) heart failure (HCC)     Comment:  cardiologist---- dr elmira;  preserved ef No date: Chronic iron  deficiency anemia No date: Chronic pain syndrome     Comment:  followed by pain management--- dr lorilee No date: CKD (chronic kidney disease), stage III (HCC) No date: Diabetic gastroparesis (HCC) No date: Diabetic peripheral neuropathy (HCC) No date: Fibromyalgia No date: GAD (generalized anxiety disorder) No date: Generalized abdominal pain No date: GERD (gastroesophageal reflux disease) 03/21/2023: History of acute renal failure     Comment:  admission in epic due to N/V/D due ot severe sepsis POA               due to UTI No date: History of chronic gastritis     Comment:  inflammatory No date: History of diabetic ketoacidosis     Comment:  multiple admission's last 3 in epic 06/ 2022;  01/ 2022;              09/ 2021 01/2016: History of seizure     Comment:  hypoglycemic seizure 12/2020: History of vertebral compression fracture     Comment:  T11 -- T12 & L1 No date: Hyperlipidemia No date: Hypertension     Comment:  followed by pcp No date: Insulin  dependent type 2 diabetes mellitus (HCC)     Comment:  uncontrolled,  followed by pcp No date: Irritable bowel syndrome with constipation No date: MDD (major depressive disorder) No  date: Moderate COPD (chronic obstructive pulmonary disease) (HCC)     Comment:  pulmology--- dr brenna No date: Moderate persistent asthma No date: Sickle cell trait 10/2019: Vitamin D  deficiency No date: Wears glasses  HPI  Patient presents today for follow-up on shingles.  She does still have pain with shingles to her left thigh.  The rash is is almost healed with just 1 small scab remaining.  The patient is managed by pain management for chronic pain.  She does want flu shot in office today.  She is interested in getting shingles vaccine.  We discussed that she does need to talk to her pharmacy about this and may need to wait until her scab is completely healed and she is no longer having pain from the shingles. Denies f/c/s, n/v/d, hemoptysis, PND, leg swelling Denies chest pain or edema        Allergies  Allergen Reactions   Elavil  [Amitriptyline ] Other (See Comments)    Coma   Orudis [Ketoprofen] Nausea And Vomiting   Desyrel  [Trazodone ] Nausea And Vomiting   Tylenol  [Acetaminophen ] Nausea And Vomiting   Ambien  [Zolpidem  Tartrate] Nausea And Vomiting    She also reported headache   Aspirin  Nausea  Only   Hydroxyzine  Hcl Rash   Motrin  [Ibuprofen ] Nausea And Vomiting   Naprosyn [Naproxen] Nausea And Vomiting   Neurontin [Gabapentin] Nausea And Vomiting and Other (See Comments)    upset stomach   Sulfa Antibiotics Nausea And Vomiting   Ultram  [Tramadol ] Nausea And Vomiting and Other (See Comments)    stomach upset   Victoza [Liraglutide] Nausea And Vomiting   Zegerid [Omeprazole -Sodium Bicarbonate ] Nausea And Vomiting    Immunization History  Administered Date(s) Administered   Influenza, Seasonal, Injecte, Preservative Fre 07/30/2023, 09/08/2024   Influenza,inj,Quad PF,6+ Mos 09/03/2018, 07/03/2019, 07/04/2020, 07/01/2021   PNEUMOCOCCAL CONJUGATE-20 07/30/2023   Pneumococcal Polysaccharide-23 03/28/2015, 07/04/2020   Tdap 03/27/2016   Unspecified SARS-COV-2  Vaccination 03/25/2020, 05/05/2020    Tobacco History: Social History   Tobacco Use  Smoking Status Some Days   Current packs/day: 0.00   Average packs/day: 0.3 packs/day for 26.0 years (6.5 ttl pk-yrs)   Types: Cigarettes   Start date: 09/13/1994   Last attempt to quit: 09/13/2020   Years since quitting: 3.9  Smokeless Tobacco Never  Tobacco Comments   Pt smokes 1/2 ppd. AB. CMA 09-10-23   Ready to quit: Not Answered Counseling given: Not Answered Tobacco comments: Pt smokes 1/2 ppd. AB. CMA 09-10-23   Outpatient Encounter Medications as of 09/08/2024  Medication Sig   Accu-Chek Softclix Lancets lancets Use as instructed to monitor blood sugar 4 times daily   albuterol  (PROVENTIL ) (2.5 MG/3ML) 0.083% nebulizer solution Take 3 mLs (2.5 mg total) by nebulization every 6 (six) hours as needed for wheezing or shortness of breath.   albuterol  (VENTOLIN  HFA) 108 (90 Base) MCG/ACT inhaler Inhale 2 puffs into the lungs every 6 (six) hours as needed for wheezing or shortness of breath.   Blood Glucose Monitoring Suppl (ACCU-CHEK GUIDE) w/Device KIT Use as directed to monitor blood sugar 4 times daily (before each meal and before bed).   Blood Pressure Monitoring KIT 1 each by Does not apply route 3 (three) times a week.   budesonide -formoterol  (SYMBICORT ) 160-4.5 MCG/ACT inhaler Inhale 2 puffs into the lungs 2 (two) times daily.   busPIRone  (BUSPAR ) 10 MG tablet Take 1 tablet (10 mg total) by mouth 2 (two) times daily.   Dexlansoprazole  30 MG capsule DR Take 1 capsule (30 mg total) by mouth daily. NEEDS OFFICE VISIT FOR ADDITIONAL REFILLS   diclofenac  Sodium (VOLTAREN ) 1 % GEL Apply 2 to 4 gram to painful sites up to 4 times daily if needed, max daily dose: 32 Gram   diclofenac  Sodium (VOLTAREN ) 1 % GEL apply 2 to 4 gram to painful sites up to 4 times daily if needed, max daily dose: 32 Gram   diclofenac  Sodium (VOLTAREN ) 1 % GEL Apply 2-4 grams topically up to 4 (four) times daily as  needed. max 32 gm daily   dicyclomine  (BENTYL ) 20 MG tablet Take 1 tablet (20 mg total) by mouth 2 (two) times daily.   empagliflozin  (JARDIANCE ) 25 MG TABS tablet Take 1 tablet (25 mg total) by mouth daily before breakfast.   ferrous sulfate  325 (65 FE) MG tablet Take 1 tablet (325 mg total) by mouth daily with breakfast.   glucose blood (ACCU-CHEK GUIDE TEST) test strip Use as instructed to monitor blood sugar 4 times daily   GVOKE HYPOPEN  2-PACK 1 MG/0.2ML SOAJ Inject 1 mg into the skin as needed (severe hypoglycemia with not able to take oral medication or severe alterned mental status / unconcious.).   hydrALAZINE  (APRESOLINE ) 25 MG tablet Take 1  tablet (25 mg total) by mouth 3 (three) times daily.   insulin  glargine, 2 Unit Dial , (TOUJEO  MAX SOLOSTAR) 300 UNIT/ML Solostar Pen Inject 70 Units into the skin daily. May increase up to 100 units daily if instructed by your provider.   insulin  lispro (HUMALOG  KWIKPEN) 100 UNIT/ML KwikPen Inject 30 Units into the skin with breakfast, with lunch, and with evening meal. May increase up to 40 units with each meal if instructed by your provider.   Insulin  Pen Needle (PEN NEEDLES) 31G X 5 MM MISC Use as instructed to inject insulin  four times day   levocetirizine (XYZAL) 5 MG tablet Take 5 mg by mouth every evening.   losartan  (COZAAR ) 25 MG tablet 1 tab by mouth daily   metFORMIN  (GLUCOPHAGE -XR) 500 MG 24 hr tablet Take 2 tablets (1,000 mg total) by mouth 2 (two) times daily with a meal.   metoCLOPramide  (REGLAN ) 10 MG tablet Take 1 tablet (10 mg total) by mouth every 8 (eight) hours as needed for nausea.   metoprolol  succinate (TOPROL -XL) 100 MG 24 hr tablet Take 1 tablet (100 mg total) by mouth daily. Take with or immediately following a meal.   Misc. Devices (BMI DIGITAL SMART SCALE) MISC 1 each by Does not apply route daily.   naloxone  (NARCAN ) nasal spray 4 mg/0.1 mL Insert 1 spray into one nostril if poorly responding / turning blue. CALL 911 ASAP    nitroGLYCERIN  (NITROSTAT ) 0.4 MG SL tablet Place 1 tablet (0.4 mg total) under the tongue every 5 (five) minutes as needed for chest pain. Additional refills to be filled by PCP, patient aware   norethindrone  (AYGESTIN ) 5 MG tablet Take 1 tablet (5 mg total) by mouth 2 (two) times daily as needed (bleeding).   ondansetron  (ZOFRAN -ODT) 4 MG disintegrating tablet Take 1 tablet (4 mg total) by mouth every 8 (eight) hours as needed for nausea or vomiting.   Oxycodone  HCl 10 MG TABS Take 1 tablet (10 mg total) by mouth 4 (four) times daily as needed for pain   Oxycodone  HCl 10 MG TABS Take 1 tablet (10 mg total) by mouth 4 (four) times daily if needed for pain   Oxycodone  HCl 10 MG TABS Take 1 tablet (10 mg total) by mouth 4 (four) times daily as needed for pain 06/20/24   Oxycodone  HCl 10 MG TABS Take 1 tablet (10 mg total) by mouth 4 (four) times daily as needed for pain   Oxycodone  HCl 10 MG TABS Take 1 tablet (10 mg total) by mouth 5 (five) times daily as needed for pain   potassium chloride  SA (KLOR-CON  M) 20 MEQ tablet Take 1 tablet (20 mEq total) by mouth daily.   pregabalin  (LYRICA ) 150 MG capsule Take 1 capsule (150 mg total) by mouth 3 (three) times daily.   QUEtiapine  (SEROQUEL ) 25 MG tablet Take 1 tablet (25 mg total) by mouth at bedtime.   rosuvastatin  (CRESTOR ) 20 MG tablet Take 1 tablet (20 mg total) by mouth daily. KEEP OV.   senna (SENOKOT) 8.6 MG TABS tablet Take 1 tablet (8.6 mg total) by mouth daily.   spironolactone  (ALDACTONE ) 25 MG tablet Take 1 tablet (25 mg total) by mouth daily.   sucralfate  (CARAFATE ) 1 g tablet Take 1 tablet (1 g total) by mouth 4 (four) times daily -  with meals and at bedtime.   tirzepatide  (MOUNJARO ) 7.5 MG/0.5ML Pen Inject 7.5 mg into the skin once a week.   tizanidine  (ZANAFLEX ) 6 MG capsule Take 1 capsule (6  mg total) by mouth 3 (three) times daily.   torsemide  (DEMADEX ) 20 MG tablet Take 3 tablets (60 mg total) by mouth in the morning for swelling.    Continuous Glucose Sensor (FREESTYLE LIBRE 3 PLUS SENSOR) MISC Change sensor every 15 days. (Patient not taking: Reported on 09/08/2024)   Continuous Glucose Sensor (FREESTYLE LIBRE 3 PLUS SENSOR) MISC Change every 15 days. (Patient not taking: Reported on 09/08/2024)   Melatonin 10 MG TABS Take 10 mg by mouth at bedtime as needed (insomnia). (Patient not taking: Reported on 09/08/2024)   No facility-administered encounter medications on file as of 09/08/2024.    Review of Systems  Review of Systems  Constitutional: Negative.   HENT: Negative.    Cardiovascular: Negative.   Gastrointestinal: Negative.   Allergic/Immunologic: Negative.   Neurological: Negative.   Psychiatric/Behavioral: Negative.       Objective:   BP (!) 149/69 (BP Location: Right Arm, Patient Position: Sitting, Cuff Size: Large)   Pulse 93   Wt 290 lb 6.4 oz (131.7 kg)   SpO2 97%   BMI 49.85 kg/m   Wt Readings from Last 5 Encounters:  09/08/24 290 lb 6.4 oz (131.7 kg)  08/15/24 288 lb (130.6 kg)  08/09/24 295 lb 10.2 oz (134.1 kg)  08/06/24 295 lb 9.6 oz (134.1 kg)  07/28/24 295 lb (133.8 kg)     Physical Exam Vitals and nursing note reviewed.  Constitutional:      General: She is not in acute distress.    Appearance: She is well-developed.  Cardiovascular:     Rate and Rhythm: Normal rate and regular rhythm.  Pulmonary:     Effort: Pulmonary effort is normal.     Breath sounds: Normal breath sounds.  Neurological:     Mental Status: She is alert and oriented to person, place, and time.       Assessment & Plan:   Flu vaccine need -     Flu vaccine trivalent PF, 6mos and older(Flulaval,Afluria,Fluarix,Fluzone )     Return if symptoms worsen or fail to improve.   Bascom GORMAN Borer, NP 09/08/2024

## 2024-09-09 ENCOUNTER — Ambulatory Visit: Payer: Self-pay | Admitting: Nurse Practitioner

## 2024-09-09 ENCOUNTER — Other Ambulatory Visit (HOSPITAL_COMMUNITY): Payer: Self-pay

## 2024-09-09 ENCOUNTER — Telehealth (HOSPITAL_BASED_OUTPATIENT_CLINIC_OR_DEPARTMENT_OTHER): Admitting: Family

## 2024-09-09 ENCOUNTER — Other Ambulatory Visit: Payer: Self-pay

## 2024-09-09 DIAGNOSIS — G479 Sleep disorder, unspecified: Secondary | ICD-10-CM

## 2024-09-09 DIAGNOSIS — F321 Major depressive disorder, single episode, moderate: Secondary | ICD-10-CM

## 2024-09-09 DIAGNOSIS — F419 Anxiety disorder, unspecified: Secondary | ICD-10-CM | POA: Diagnosis not present

## 2024-09-09 DIAGNOSIS — F1193 Opioid use, unspecified with withdrawal: Secondary | ICD-10-CM

## 2024-09-09 MED ORDER — QUETIAPINE FUMARATE 50 MG PO TABS
50.0000 mg | ORAL_TABLET | Freq: Every evening | ORAL | 0 refills | Status: DC | PRN
  Filled 2024-09-09: qty 30, 15d supply, fill #0

## 2024-09-09 MED ORDER — QUETIAPINE FUMARATE 50 MG PO TABS: 50.0000 mg | ORAL_TABLET | Freq: Every evening | ORAL | 0 refills | Status: DC | PRN

## 2024-09-09 NOTE — Progress Notes (Unsigned)
 Virtual Visit via Video Note  I connected with Janece M Matin on 09/09/24 at  3:00 PM EST by a video enabled telemedicine application and verified that I am speaking with the correct person using two identifiers.  Location: Patient: Home Provider: Office   I discussed the limitations of evaluation and management by telemedicine and the availability of in person appointments. The patient expressed understanding and agreed to proceed.  I discussed the assessment and treatment plan with the patient. The patient was provided an opportunity to ask questions and all were answered. The patient agreed with the plan and demonstrated an understanding of the instructions.   The patient was advised to call back or seek an in-person evaluation if the symptoms worsen or if the condition fails to improve as anticipated.  I provided 20 minutes of non-face-to-face time during this encounter.   Staci LOISE Kerns, NP   BH MD/PA/NP OP Progress Note  09/10/2024 8:29 AM Mliss CHRISTELLA Louder  MRN:  [id number]  Chief Complaint: Medication Management   HPI: Raizy Auzenne 48 year old female presents for medication management follow-up appointment.  Seen and evaluated via virtual platform through care agility.  Continues to report concerns related to sleep disturbance.  Has tried and failed multiple medications in the past.  States that she does not feel Seroquel  is helping with her symptoms either.  Advised patient that this provider will make a referral for sleep study.  She was amendable to increasing Seroquel  from 50 mg to 100 mg nightly.  As previously documented Luella has not been able to tolerate melatonin, trazodone , doxepin, amitriptyline , hydroxyzine , ambien  or unisom, and recently discontinue Rozerem .  Discussed initiating mirtazapine patient was advised of potential weight gain with medication however, of note mirtazapine has Qtc prolongation contronindicaiton with multiple medication that she is currently  prescribed.  In particular Symbicort , Ventolin  and tizanidine .  Desarai denied concerns related to suicidal or homicidal ideations.  Denied auditory or visual hallucinations.  Reports sleeping 2 to 3 hours nightly waking up and not feeling rested.  Will defer for sleep study-patient was referred Patient may utilize Seroquel  50 mg to 100 mg -Follow-up 2 months    Visit Diagnosis:    ICD-10-CM   1. Sleep disturbance  G47.9     2. Anxiety  F41.9       Past Psychiatric History:  Denied previous history related to mental illness.  Denied previous inpatient admissions.  Denied prescription for psychotropic medications previously.  No documented concerns related to suicidal or homicidal ideations.  Denied illicit drug use or substance abuse histor   Past Medical History:  Past Medical History:  Diagnosis Date   Abnormal uterine bleeding (AUB)    Arthritis    knees, hands   Atypical chest pain    cardiology--- dr elmira did cardiac cath 08-30-2021 showed minimal luminal irregularity involving LAD all other coronaries w/ normal  flow   Chronic diastolic (congestive) heart failure [facility])    cardiologist---- dr elmira;  preserved ef   Chronic iron  deficiency anemia    Chronic pain syndrome    followed by pain management--- dr lorilee   CKD (chronic kidney disease), stage III (HCC)    Diabetic gastroparesis (HCC)    Diabetic peripheral neuropathy (HCC)    Fibromyalgia    GAD (generalized anxiety disorder)    Generalized abdominal pain    GERD (gastroesophageal reflux disease)    History of acute renal failure 03/21/2023   admission in epic due to N/V/D due ot severe sepsis  POA due to UTI   History of chronic gastritis    inflammatory   History of diabetic ketoacidosis    multiple admission's last 3 in epic 06/ 2022;  01/ 2022;   09/ 2021   History of seizure 01/2016   hypoglycemic seizure   History of vertebral compression fracture 12/2020   T11 -- T12 & L1   Hyperlipidemia     Hypertension    followed by pcp   Insulin  dependent type 2 diabetes mellitus (HCC)    uncontrolled,  followed by pcp   Irritable bowel syndrome with constipation    MDD (major depressive disorder)    Moderate COPD (chronic obstructive pulmonary disease) (HCC)    pulmology--- dr brenna   Moderate persistent asthma    Sickle cell trait    Vitamin D  deficiency 10/2019   Wears glasses     Past Surgical History:  Procedure Laterality Date   CATARACT EXTRACTION W/ INTRAOCULAR LENS IMPLANT Left    CESAREAN SECTION  2001   for twins   DILATION AND CURETTAGE OF UTERUS N/A 08/20/2019   Procedure: DILATATION AND CURETTAGE;  Surgeon: Starla Harland BROCKS, MD;  Location: [facility];  Service: Gynecology;  Laterality: N/A;   DILATION AND CURETTAGE OF UTERUS  05/01/2023   Procedure: DILATATION AND CURETTAGE;  Surgeon: Jeralyn Crutch, MD;  Location: [facility];  Service: Gynecology;;   ENDOMETRIAL ABLATION N/A 08/20/2019   Procedure: Lela Ablation;  Surgeon: Starla Harland BROCKS, MD;  Location: [facility];  Service: Gynecology;  Laterality: N/A;   EYE SURGERY Bilateral    laser right and cataract removed left eye   HYSTEROSCOPY N/A 05/01/2023   Procedure: HYSTEROSCOPY;  Surgeon: Jeralyn Crutch, MD;  Location: [facility];  Service: Gynecology;  Laterality: N/A;   INTRAUTERINE DEVICE (IUD) INSERTION N/A 05/01/2023   Procedure: INTRAUTERINE DEVICE (IUD) INSERTION;  Surgeon: Jeralyn Crutch, MD;  Location: [facility];  Service: Gynecology;  Laterality: N/A;   LEFT HEART CATH AND CORONARY ANGIOGRAPHY N/A 08/30/2021   Procedure: LEFT HEART CATH AND CORONARY ANGIOGRAPHY;  Surgeon: Elmira Newman PARAS, MD;  [facility];  Service: Cardiovascular;  Laterality: N/A;   RADIOLOGY WITH ANESTHESIA N/A 09/16/2019   Procedure: MRI WITH ANESTHESIA   L SPINE WITHOUT CONTRAST, T SPINE WITHOUT CONTRAST , CERVICAL WITHOUT CONTRAST;  Surgeon: Radiologist, Medication,  MD;  Location: [facility];  Service: Radiology;  Laterality: N/A;   TOOTH EXTRACTION N/A 01/14/2024   Procedure: DENTAL RESTORATION/EXTRACTIONS;  Surgeon: Sheryle Hamilton, DMD;  Location: [facility];  Service: Oral Surgery;  Laterality: N/A;   TUBAL LIGATION     interval BTL   UPPER GI ENDOSCOPY  07/2017    Family Psychiatric History:   Family History:  Family History  Problem Relation Age of Onset   Diabetes Mother    Hypertension Mother    Colon cancer Maternal Grandfather    Migraines Paternal Grandfather    Colon cancer Maternal Aunt    Breast cancer Maternal Aunt    Esophageal cancer Neg Hx    Stomach cancer Neg Hx    Rectal cancer Neg Hx     Social History:  Social History   Socioeconomic History   Marital status: Divorced    Spouse name: Not on file   Number of children: 3   Years of education: Not on file   Highest education level: Not on file  Occupational History   Occupation: unemployed  Tobacco Use   Smoking status: Some  Days    Current packs/day: 0.00    Average packs/day: 0.3 packs/day for 26.0 years (6.5 ttl pk-yrs)    Types: Cigarettes    Start date: 09/13/1994    Last attempt to quit: 09/13/2020    Years since quitting: 3.9   Smokeless tobacco: Never   Tobacco comments:    Pt smokes 1/2 ppd. AB. CMA 09-10-23  Vaping Use   Vaping status: Never Used  Substance and Sexual Activity   Alcohol use: No   Drug use: No   Sexual activity: Not Currently    Birth control/protection: I.U.D.  Other Topics Concern   Not on file  Social History Narrative   Right Handed   Lives in a one story apartment, but lives on the second floor   Drinks caffeine  once in awhile   Social Drivers of Health   Financial Resource Strain: Low Risk  (03/18/2023)   Overall Financial Resource Strain (CARDIA)    Difficulty of Paying Living Expenses: Not very hard  Food Insecurity: No Food Insecurity (03/27/2024)   Hunger Vital Sign    Worried About Running Out of Food in the Last Year:  Never true    Ran Out of Food in the Last Year: Never true  Transportation Needs: No Transportation Needs (03/27/2024)   PRAPARE - Administrator, Civil Service (Medical): No    Lack of Transportation (Non-Medical): No  Physical Activity: Inactive (03/18/2023)   Exercise Vital Sign    Days of Exercise per Week: 0 days    Minutes of Exercise per Session: 0 min  Stress: Stress Concern Present (03/18/2023)   [employer] of Occupational Health - Occupational Stress Questionnaire    Feeling of Stress : To some extent  Social Connections: Moderately Isolated (02/04/2024)   Social Connection and Isolation Panel    Frequency of Communication with Friends and Family: More than three times a week    Frequency of Social Gatherings with Friends and Family: Three times a week    Attends Religious Services: 1 to 4 times per year    Active Member of Clubs or Organizations: No    Attends Banker Meetings: Never    Marital Status: Separated    Allergies:  Allergies  Allergen Reactions   Elavil  [Amitriptyline ] Other (See Comments)    Coma   Orudis [Ketoprofen] Nausea And Vomiting   Desyrel  [Trazodone ] Nausea And Vomiting   Tylenol  [Acetaminophen ] Nausea And Vomiting   Ambien  [Zolpidem  Tartrate] Nausea And Vomiting    She also reported headache   Aspirin  Nausea Only   Hydroxyzine  Hcl Rash   Motrin  [Ibuprofen ] Nausea And Vomiting   Naprosyn [Naproxen] Nausea And Vomiting   Neurontin [Gabapentin] Nausea And Vomiting and Other (See Comments)    upset stomach   Sulfa Antibiotics Nausea And Vomiting   Ultram  [Tramadol ] Nausea And Vomiting and Other (See Comments)    stomach upset   Victoza [Liraglutide] Nausea And Vomiting   Zegerid [Omeprazole -Sodium Bicarbonate ] Nausea And Vomiting    Metabolic Disorder Labs: Lab Results  Component Value Date   HGBA1C 7.4 (A) 08/06/2024   MPG 217.34 03/27/2024   MPG 188.64 07/29/2023   No results found for: PROLACTIN Lab  Results  Component Value Date   CHOL 153 12/09/2021   TRIG 144 12/09/2021   HDL 54 12/09/2021   CHOLHDL 2.8 12/09/2021   VLDL 29 03/09/2017   LDLCALC 74 12/09/2021   LDLCALC 48 07/29/2021   Lab Results  Component Value Date  TSH 1.813 07/30/2023   TSH 2.330 07/29/2021    Therapeutic Level Labs: No results found for: LITHIUM No results found for: VALPROATE No results found for: CBMZ  Current Medications: Current Outpatient Medications  Medication Sig Dispense Refill   Accu-Chek Softclix Lancets lancets Use as instructed to monitor blood sugar 4 times daily 300 each 4   albuterol  (PROVENTIL ) (2.5 MG/3ML) 0.083% nebulizer solution Take 3 mLs (2.5 mg total) by nebulization every 6 (six) hours as needed for wheezing or shortness of breath. 75 mL 2   albuterol  (VENTOLIN  HFA) 108 (90 Base) MCG/ACT inhaler Inhale 2 puffs into the lungs every 6 (six) hours as needed for wheezing or shortness of breath. 8.5 g 3   Blood Glucose Monitoring Suppl (ACCU-CHEK GUIDE) w/Device KIT Use as directed to monitor blood sugar 4 times daily (before each meal and before bed). 1 kit 0   Blood Pressure Monitoring KIT 1 each by Does not apply route 3 (three) times a week. 1 kit 0   budesonide -formoterol  (SYMBICORT ) 160-4.5 MCG/ACT inhaler Inhale 2 puffs into the lungs 2 (two) times daily. 10.2 g 5   busPIRone  (BUSPAR ) 10 MG tablet Take 1 tablet (10 mg total) by mouth 2 (two) times daily. 60 tablet 2   Continuous Glucose Sensor (FREESTYLE LIBRE 3 PLUS SENSOR) MISC Change sensor every 15 days. (Patient not taking: Reported on 09/08/2024) 2 each 11   Continuous Glucose Sensor (FREESTYLE LIBRE 3 PLUS SENSOR) MISC Change every 15 days. (Patient not taking: Reported on 09/08/2024) 6 each 3   Dexlansoprazole  30 MG capsule DR Take 1 capsule (30 mg total) by mouth daily. NEEDS OFFICE VISIT FOR ADDITIONAL REFILLS 90 capsule 1   diclofenac  Sodium (VOLTAREN ) 1 % GEL Apply 2 to 4 gram to painful sites up to 4 times  daily if needed, max daily dose: 32 Gram 300 g 1   diclofenac  Sodium (VOLTAREN ) 1 % GEL apply 2 to 4 gram to painful sites up to 4 times daily if needed, max daily dose: 32 Gram 300 g 1   diclofenac  Sodium (VOLTAREN ) 1 % GEL Apply 2-4 grams topically up to 4 (four) times daily as needed. max 32 gm daily 300 g 1   dicyclomine  (BENTYL ) 20 MG tablet Take 1 tablet (20 mg total) by mouth 2 (two) times daily. 60 tablet 3   empagliflozin  (JARDIANCE ) 25 MG TABS tablet Take 1 tablet (25 mg total) by mouth daily before breakfast. 90 tablet 3   ferrous sulfate  325 (65 FE) MG tablet Take 1 tablet (325 mg total) by mouth daily with breakfast. 30 tablet 0   glucose blood (ACCU-CHEK GUIDE TEST) test strip Use as instructed to monitor blood sugar 4 times daily 300 each 4   GVOKE HYPOPEN  2-PACK 1 MG/0.2ML SOAJ Inject 1 mg into the skin as needed (severe hypoglycemia with not able to take oral medication or severe alterned mental status / unconcious.). 0.4 mL 1   hydrALAZINE  (APRESOLINE ) 25 MG tablet Take 1 tablet (25 mg total) by mouth 3 (three) times daily. 90 tablet 0   insulin  glargine, 2 Unit Dial , (TOUJEO  MAX SOLOSTAR) 300 UNIT/ML Solostar Pen Inject 70 Units into the skin daily. May increase up to 100 units daily if instructed by your provider. 18 mL 5   insulin  lispro (HUMALOG  KWIKPEN) 100 UNIT/ML KwikPen Inject 30 Units into the skin with breakfast, with lunch, and with evening meal. May increase up to 40 units with each meal if instructed by your provider. 36 mL 5  Insulin  Pen Needle (PEN NEEDLES) 31G X 5 MM MISC Use as instructed to inject insulin  four times day 200 each 3   levocetirizine (XYZAL) 5 MG tablet Take 5 mg by mouth every evening.     losartan  (COZAAR ) 25 MG tablet 1 tab by mouth daily 90 tablet 3   Melatonin 10 MG TABS Take 10 mg by mouth at bedtime as needed (insomnia). (Patient not taking: Reported on 09/08/2024) 30 tablet 0   metFORMIN  (GLUCOPHAGE -XR) 500 MG 24 hr tablet Take 2 tablets  (1,000 mg total) by mouth 2 (two) times daily with a meal. 360 tablet 1   metoprolol  succinate (TOPROL -XL) 100 MG 24 hr tablet Take 1 tablet (100 mg total) by mouth daily. Take with or immediately following a meal. 90 tablet 0   Misc. Devices (BMI DIGITAL SMART SCALE) MISC 1 each by Does not apply route daily. 1 each 0   naloxone  (NARCAN ) nasal spray 4 mg/0.1 mL Insert 1 spray into one nostril if poorly responding / turning blue. CALL 911 ASAP 2 each 0   nitroGLYCERIN  (NITROSTAT ) 0.4 MG SL tablet Place 1 tablet (0.4 mg total) under the tongue every 5 (five) minutes as needed for chest pain. Additional refills to be filled by PCP, patient aware 25 tablet 0   norethindrone  (AYGESTIN ) 5 MG tablet Take 1 tablet (5 mg total) by mouth 2 (two) times daily as needed (bleeding). 90 tablet 8   ondansetron  (ZOFRAN -ODT) 4 MG disintegrating tablet Take 1 tablet (4 mg total) by mouth every 8 (eight) hours as needed for nausea or vomiting. 20 tablet 0   Oxycodone  HCl 10 MG TABS Take 1 tablet (10 mg total) by mouth 4 (four) times daily as needed for pain 120 tablet 0   Oxycodone  HCl 10 MG TABS Take 1 tablet (10 mg total) by mouth 4 (four) times daily if needed for pain 120 tablet 0   Oxycodone  HCl 10 MG TABS Take 1 tablet (10 mg total) by mouth 4 (four) times daily as needed for pain 06/20/24 12 tablet 0   Oxycodone  HCl 10 MG TABS Take 1 tablet (10 mg total) by mouth 4 (four) times daily as needed for pain 12 tablet 0   Oxycodone  HCl 10 MG TABS Take 1 tablet (10 mg total) by mouth 5 (five) times daily as needed for pain 150 tablet 0   potassium chloride  SA (KLOR-CON  M) 20 MEQ tablet Take 1 tablet (20 mEq total) by mouth daily. 30 tablet 0   pregabalin  (LYRICA ) 150 MG capsule Take 1 capsule (150 mg total) by mouth 3 (three) times daily. 270 capsule 3   QUEtiapine  (SEROQUEL ) 50 MG tablet Take 1 tablet (50 mg total) by mouth at bedtime and may repeat dose one time if needed. 30 tablet 0   rosuvastatin  (CRESTOR ) 20 MG  tablet Take 1 tablet (20 mg total) by mouth daily. KEEP OV. 90 tablet 0   senna (SENOKOT) 8.6 MG TABS tablet Take 1 tablet (8.6 mg total) by mouth daily. 120 tablet 0   spironolactone  (ALDACTONE ) 25 MG tablet Take 1 tablet (25 mg total) by mouth daily. 90 tablet 0   sucralfate  (CARAFATE ) 1 g tablet Take 1 tablet (1 g total) by mouth 4 (four) times daily -  with meals and at bedtime. 120 tablet 3   tirzepatide  (MOUNJARO ) 7.5 MG/0.5ML Pen Inject 7.5 mg into the skin once a week. 6 mL 4   tizanidine  (ZANAFLEX ) 6 MG capsule Take 1 capsule (6 mg total) by  mouth 3 (three) times daily. 90 capsule 3   torsemide  (DEMADEX ) 20 MG tablet Take 3 tablets (60 mg total) by mouth in the morning for swelling. 270 tablet 3   No current facility-administered medications for this visit.     Musculoskeletal: Virtual assessment   Psychiatric Specialty Exam: Review of Systems  There were no vitals taken for this visit.There is no height or weight on file to calculate BMI.  General Appearance: Casual  Eye Contact:  Good  Speech:  Clear and Coherent  Volume:  Normal  Mood:  Anxious and Depressed  Affect:  Congruent  Thought Process:  Coherent  Orientation:  Full (Time, Place, and Person)  Thought Content: Logical   Suicidal Thoughts:  No  Homicidal Thoughts:  No  Memory:  Immediate;   Good Recent;   Good  Judgement:  Good  Insight:  Good  Psychomotor Activity:  Normal  Concentration:  Concentration: Good  Recall:  Good  Fund of Knowledge: Good  Language: Good  Akathisia:  No  Handed:  Right  AIMS (if indicated): not done  Assets:  Communication Skills Desire for Improvement  ADL's:  Intact  Cognition: WNL  Sleep:  Poor medications not helping with symptoms patient reported   Screenings: GAD-7    Flowsheet Row Office Visit from 08/15/2024 in [facility] Dept Of [facility] Office Visit from [facility]/20[facility] Office Visit from 09/05/2022 in [facility] Office Visit from 07/25/2021 in [facility] Clinical Support from 04/15/2021 in [facility]  Total GAD-7 Score 10 10 0 12 4   PHQ2-9    Flowsheet Row Office Visit from 08/15/2024 in [facility] - A Dept Of [facility] Office Visit from 07/29/2024 in [facility] Office Visit from 02[facility]14/2024 in [facility] Office Visit from 12/21/2023 in [facility] - A Dept Of [facility] Office Visit from 07/26/2023 in [facility] Physical Medicine and Rehabilitation  PHQ-2 Total Score 6 4 0 0 0  PHQ-9 Total Score 21 13 -- -- --   Flowsheet Row ED from 08/09/2024 in [facility] Emergency Department at [facility] ED from 07/31/2024 in [facility] Department at [facility] ED to Hosp-Admission (Discharged) from 03/27/2024 in [facility]  C-SSRS RISK CATEGORY No Risk No Risk No Risk     Assessment and Plan: Shatara Stanek 48 year old African-American female presents for medication management follow-up due to sleep disturbance.  Previously initiated on Seroquel  25 to 50 mg nightly which she reports minimal effects of medications.  Discussed titrating medication and following up with sleep study.  Previously reported Xanax has been the only medication to help with sleep disturbance and relaxation.  Will defer for sleep study and recommendations.   Collaboration of Care: Collaboration of Care: Medication Management AEB continue Seroquel  50 mg 100 mg nightly as needed  Patient/Guardian was advised Release of Information must be obtained prior to any record release in order to collaborate their care with an outside  provider. Patient/Guardian was advised if they have not already done so to contact the registration department to sign all necessary forms in order for us  to release  information regarding their care.   Consent: Patient/Guardian gives verbal consent for treatment and assignment of benefits for services provided during this visit. Patient/Guardian expressed understanding and agreed to proceed.    Staci LOISE Kerns, NP 09/09/2024, 8:29 AM

## 2024-09-10 ENCOUNTER — Other Ambulatory Visit: Payer: Self-pay

## 2024-09-10 DIAGNOSIS — F1193 Opioid use, unspecified with withdrawal: Secondary | ICD-10-CM | POA: Insufficient documentation

## 2024-09-15 ENCOUNTER — Ambulatory Visit: Payer: Self-pay | Admitting: *Deleted

## 2024-09-15 NOTE — Telephone Encounter (Signed)
 FYI Only or Action Required?: Action required by provider: medication request, vaccine clearance.  Patient was last seen in primary care on 09/08/2024 by Oley Bascom RAMAN, NP.  Called Nurse Triage reporting Cough.  Symptoms began a week ago.  Interventions attempted: Nothing.  Symptoms are: gradually worsening.  Triage Disposition: See Physician Within 24 Hours  Patient/caregiver understands and will follow disposition?: No, refuses disposition  Copied from CRM #[id number]. Topic: Clinical - Red Word Triage >> Sep 15, 2024  1:24 PM Jill Shaw wrote: Red Word that prompted transfer to Nurse Triage:  She has been coughing up yellow/green mucus for about 1 week. She also has a question about the Shingles (Zoster) vaccine. When can she get the vaccine since she just had shingles? Reason for Disposition  [1] Continuous (nonstop) coughing interferes with work or school AND [2] no improvement using cough treatment per Care Advice  Answer Assessment - Initial Assessment Questions Patient declines appointment - patient is requesting a medication like Mucinex - she can not afford OTC medication. Patient also needs provider to call pharmacy and give permission to give shingles vaccine - they have declined to give due to recent outbreak and patinet's compromised immune system. They are recommending she wait 3 months. Pharmacy: [facility] [address]  1. ONSET: When did the cough begin?      1 week ago 2. SEVERITY: How bad is the cough today?      Congested cough, rattles- worse in am, during the day has on/off coughng 3. SPUTUM: Describe the color of your sputum (e.g., none, dry cough; clear, white, yellow, green)     Yellow/green 4. HEMOPTYSIS: Are you coughing up any blood? If Yes, ask: How much? (e.g., flecks, streaks, tablespoons, etc.)     no 5. DIFFICULTY BREATHING: Are you having difficulty breathing? If Yes, ask: How bad is it? (e.g., mild, moderate, severe)       no 6. FEVER: Do you have a fever? If Yes, ask: What is your temperature, how was it measured, and when did it start?     no 7. CARDIAC HISTORY: Do you have any history of heart disease? (e.g., heart attack, congestive heart failure)      CHF 8. LUNG HISTORY: Do you have any history of lung disease?  (e.g., pulmonary embolus, asthma, emphysema)     COPD 9. PE RISK FACTORS: Do you have a history of blood clots? (or: recent major surgery, recent prolonged travel, bedridden)     no 10. OTHER SYMPTOMS: Do you have any other symptoms? (e.g., runny nose, wheezing, chest pain)       Little runny nose, mainly cough- does have pain in chest with cough  Protocols used: Cough - Acute Productive-A-AH

## 2024-09-16 ENCOUNTER — Telehealth: Payer: Self-pay | Admitting: Nurse Practitioner

## 2024-09-16 NOTE — Telephone Encounter (Signed)
 Copied from CRM #[id number]. Topic: Clinical - Medication Question >> Sep 16, 2024  2:16 PM Shanda MATSU wrote: Reason for CRM: Patient calling back to check status of whether med is going to be called into pharmacy for her, patient is requesting a medication like Mucinex - she can not afford OTC medication. Patient also needs provider to call pharmacy and give permission to give shingles vaccine - they have declined to give due to recent outbreak and patient's compromised immune system. They are recommending she wait 3 months. Pharmacy: [facility], patient is req a call back in regards to these issues.

## 2024-09-17 ENCOUNTER — Telehealth: Payer: Self-pay

## 2024-09-17 DIAGNOSIS — E1165 Type 2 diabetes mellitus with hyperglycemia: Secondary | ICD-10-CM

## 2024-09-17 MED ORDER — TIRZEPATIDE 10 MG/0.5ML ~~LOC~~ SOAJ
10.0000 mg | SUBCUTANEOUS | 11 refills | Status: DC
  Filled 2024-10-16: qty 2, 28d supply, fill #0

## 2024-09-17 NOTE — Addendum Note (Signed)
 Addended by: Marli Diego on: 09/17/2024 01:16 PM   Modules accepted: Orders

## 2024-09-17 NOTE — Telephone Encounter (Signed)
 Sent prescription of Mounjaro  10 mg weekly.

## 2024-09-17 NOTE — Telephone Encounter (Signed)
 Patient called stating she thought she was supposed to increase her Mounjaro  every month. Per last note increase was 5 to 7.5mg  which patient is currently taken. Sent to MD for advisement.

## 2024-09-18 ENCOUNTER — Other Ambulatory Visit: Payer: Self-pay | Admitting: Nurse Practitioner

## 2024-09-18 ENCOUNTER — Telehealth (HOSPITAL_COMMUNITY): Payer: Self-pay

## 2024-09-18 MED ORDER — GUAIFENESIN ER 600 MG PO TB12: 600.0000 mg | ORAL_TABLET | Freq: Two times a day (BID) | ORAL | 2 refills | Status: AC

## 2024-09-18 NOTE — Telephone Encounter (Signed)
 Please see message below

## 2024-09-18 NOTE — Telephone Encounter (Signed)
 Patient is calling to let the provider know that she took some extra Seroquel  because 50 mg was not working. Patient says she would like the 200 mg dose. Please review and advise, thank you

## 2024-09-19 ENCOUNTER — Other Ambulatory Visit: Payer: Self-pay | Admitting: Nurse Practitioner

## 2024-09-19 ENCOUNTER — Other Ambulatory Visit: Payer: Self-pay

## 2024-09-19 ENCOUNTER — Telehealth: Payer: Self-pay | Admitting: Nurse Practitioner

## 2024-09-19 MED ORDER — BENZONATATE 100 MG PO CAPS: 100.0000 mg | ORAL_CAPSULE | Freq: Three times a day (TID) | ORAL | 0 refills | Status: AC | PRN

## 2024-09-19 NOTE — Telephone Encounter (Signed)
 Copied from CRM #[id number]. Topic: Clinical - Medication Question >> Sep 16, 2024  2:16 PM Shanda MATSU wrote: Reason for CRM: Patient calling back to check status of whether med is going to be called into pharmacy for her, patient is requesting a medication like Mucinex - she can not afford OTC medication. Patient also needs provider to call pharmacy and give permission to give shingles vaccine - they have declined to give due to recent outbreak and patient's compromised immune system. They are recommending she wait 3 months. Pharmacy: [facility], patient is req a call back in regards to these issues.

## 2024-09-19 NOTE — Telephone Encounter (Signed)
 Medication was sent in for the DTaP. KH

## 2024-09-20 ENCOUNTER — Other Ambulatory Visit (HOSPITAL_COMMUNITY): Payer: Self-pay | Admitting: Family

## 2024-09-21 ENCOUNTER — Other Ambulatory Visit: Payer: Self-pay

## 2024-09-21 ENCOUNTER — Encounter (HOSPITAL_BASED_OUTPATIENT_CLINIC_OR_DEPARTMENT_OTHER): Payer: Self-pay

## 2024-09-21 ENCOUNTER — Emergency Department (HOSPITAL_BASED_OUTPATIENT_CLINIC_OR_DEPARTMENT_OTHER): Admission: EM | Admit: 2024-09-21 | Discharge: 2024-09-21 | Disposition: A | Discharge status: Home / Self Care

## 2024-09-21 DIAGNOSIS — Z794 Long term (current) use of insulin: Secondary | ICD-10-CM | POA: Insufficient documentation

## 2024-09-21 DIAGNOSIS — E119 Type 2 diabetes mellitus without complications: Secondary | ICD-10-CM | POA: Insufficient documentation

## 2024-09-21 DIAGNOSIS — Z7984 Long term (current) use of oral hypoglycemic drugs: Secondary | ICD-10-CM | POA: Insufficient documentation

## 2024-09-21 DIAGNOSIS — I10 Essential (primary) hypertension: Secondary | ICD-10-CM | POA: Insufficient documentation

## 2024-09-21 DIAGNOSIS — Z79899 Other long term (current) drug therapy: Secondary | ICD-10-CM | POA: Insufficient documentation

## 2024-09-21 DIAGNOSIS — B0229 Other postherpetic nervous system involvement: Secondary | ICD-10-CM | POA: Insufficient documentation

## 2024-09-21 MED ORDER — ONDANSETRON HCL 4 MG PO TABS: 4.0000 mg | ORAL_TABLET | Freq: Four times a day (QID) | ORAL | 0 refills | Status: AC

## 2024-09-21 MED ORDER — GABAPENTIN 100 MG PO CAPS: 100.0000 mg | ORAL_CAPSULE | Freq: Three times a day (TID) | ORAL | 0 refills | Status: AC

## 2024-09-21 MED ORDER — MORPHINE SULFATE (PF) 4 MG/ML IV SOLN
6.0000 mg | Freq: Once | INTRAVENOUS | Status: AC
  Administered 2024-09-21: 6 mg via INTRAMUSCULAR
  Filled 2024-09-21: qty 2

## 2024-09-21 NOTE — ED Provider Notes (Signed)
 [place] EMERGENCY DEPARTMENT AT [facility] Provider Note   CSN: [id number] Arrival date & time: 09/21/24  1840     Patient presents with: Leg Pain   Jill Shaw is a 48 y.o. female.   The history is provided by the patient and medical records. No language interpreter was used.  Leg Pain    48 year old female with history of chronic pain including knee pain hip pain lower back pain, diabetes, hypertension presenting complaining of leg pain.  Patient states she was previously diagnosed with shingle involving her left lower extremity approximately 2 weeks ago.  She was prescribed antiviral medication that she took which seems to help with the rash.  However she still have excruciating sharp burning shooting pain to her left leg not actively controlled despite taking her oxycodone  10 mg that she had at home.  No fever no bowel bladder incontinence or saddle esthesia.  No worsening rash.  No recent trauma.  Prior to Admission medications   Medication Sig Start Date End Date Taking? Authorizing Provider  Accu-Chek Softclix Lancets lancets Use as instructed to monitor blood sugar 4 times daily 06/23/24   Nichols, Tonya S, NP  albuterol  (PROVENTIL ) (2.5 MG/3ML) 0.083% nebulizer solution Take 3 mLs (2.5 mg total) by nebulization every 6 (six) hours as needed for wheezing or shortness of breath. 01/31/24 01/30/25  Parrett, Madelin RAMAN, NP  albuterol  (VENTOLIN  HFA) 108 (90 Base) MCG/ACT inhaler Inhale 2 puffs into the lungs every 6 (six) hours as needed for wheezing or shortness of breath. 11/26/23   Oley Bascom RAMAN, NP  benzonatate  (TESSALON ) 100 MG capsule Take 1 capsule (100 mg total) by mouth 3 (three) times daily as needed for cough. 09/19/24   Oley Bascom RAMAN, NP  Blood Glucose Monitoring Suppl (ACCU-CHEK GUIDE) w/Device KIT Use as directed to monitor blood sugar 4 times daily (before each meal and before bed). 06/23/24   Oley Bascom RAMAN, NP  Blood Pressure Monitoring KIT 1 each by Does  not apply route 3 (three) times a week. 08/27/24   Oley Bascom RAMAN, NP  budesonide -formoterol  (SYMBICORT ) 160-4.5 MCG/ACT inhaler Inhale 2 puffs into the lungs 2 (two) times daily. 11/26/23   Oley Bascom RAMAN, NP  busPIRone  (BUSPAR ) 10 MG tablet Take 1 tablet (10 mg total) by mouth 2 (two) times daily. 03/03/24   Oley Bascom RAMAN, NP  Continuous Glucose Sensor (FREESTYLE LIBRE 3 PLUS SENSOR) MISC Change sensor every 15 days. Patient not taking: Reported on 09/08/2024 03/31/24   Oley Bascom RAMAN, NP  Continuous Glucose Sensor (FREESTYLE LIBRE 3 PLUS SENSOR) MISC Change every 15 days. Patient not taking: Reported on 09/08/2024 08/06/24   Thapa, Sudan, MD  Dexlansoprazole  30 MG capsule DR Take 1 capsule (30 mg total) by mouth daily. NEEDS OFFICE VISIT FOR ADDITIONAL REFILLS 04/24/24   Oley Bascom RAMAN, NP  diclofenac  Sodium (VOLTAREN ) 1 % GEL Apply 2 to 4 gram to painful sites up to 4 times daily if needed, max daily dose: 32 Gram 02/19/24     diclofenac  Sodium (VOLTAREN ) 1 % GEL apply 2 to 4 gram to painful sites up to 4 times daily if needed, max daily dose: 32 Gram 04/21/24     diclofenac  Sodium (VOLTAREN ) 1 % GEL Apply 2-4 grams topically up to 4 (four) times daily as needed. max 32 gm daily 08/25/24     dicyclomine  (BENTYL ) 20 MG tablet Take 1 tablet (20 mg total) by mouth 2 (two) times daily. 11/26/23   Oley Bascom RAMAN,  NP  empagliflozin  (JARDIANCE ) 25 MG TABS tablet Take 1 tablet (25 mg total) by mouth daily before breakfast. 08/06/24   Thapa, Sudan, MD  ferrous sulfate  325 (65 FE) MG tablet Take 1 tablet (325 mg total) by mouth daily with breakfast. 03/30/24   Franchot Novel, MD  glucose blood (ACCU-CHEK GUIDE TEST) test strip Use as instructed to monitor blood sugar 4 times daily 06/23/24   Nichols, Tonya S, NP  guaiFENesin  (MUCINEX ) 600 MG 12 hr tablet Take 1 tablet (600 mg total) by mouth 2 (two) times daily. 09/18/24 09/18/25  Oley Bascom RAMAN, NP  GVOKE HYPOPEN  2-PACK 1 MG/0.2ML SOAJ Inject 1 mg  into the skin as needed (severe hypoglycemia with not able to take oral medication or severe alterned mental status / unconcious.). 08/06/24   Thapa, Sudan, MD  hydrALAZINE  (APRESOLINE ) 25 MG tablet Take 1 tablet (25 mg total) by mouth 3 (three) times daily. 08/27/24   Oley Bascom RAMAN, NP  insulin  glargine, 2 Unit Dial , (TOUJEO  MAX SOLOSTAR) 300 UNIT/ML Solostar Pen Inject 70 Units into the skin daily. May increase up to 100 units daily if instructed by your provider. 08/06/24   Thapa, Sudan, MD  insulin  lispro (HUMALOG  KWIKPEN) 100 UNIT/ML KwikPen Inject 30 Units into the skin with breakfast, with lunch, and with evening meal. May increase up to 40 units with each meal if instructed by your provider. 08/06/24   Thapa, Sudan, MD  Insulin  Pen Needle (PEN NEEDLES) 31G X 5 MM MISC Use as instructed to inject insulin  four times day 06/12/24   Nichols, Tonya S, NP  levocetirizine (XYZAL) 5 MG tablet Take 5 mg by mouth every evening.    [provider]  losartan  (COZAAR ) 25 MG tablet 1 tab by mouth daily 06/12/24     Melatonin 10 MG TABS Take 10 mg by mouth at bedtime as needed (insomnia). Patient not taking: Reported on 09/08/2024 02/07/24   Patsy Lenis, MD  metFORMIN  (GLUCOPHAGE -XR) 500 MG 24 hr tablet Take 2 tablets (1,000 mg total) by mouth 2 (two) times daily with a meal. 04/29/24   [name], [name], FNP  metoprolol  succinate (TOPROL -XL) 100 MG 24 hr tablet Take 1 tablet (100 mg total) by mouth daily. Take with or immediately following a meal. 07/21/24   Oley Bascom RAMAN, NP  Misc. Devices (BMI DIGITAL SMART SCALE) MISC 1 each by Does not apply route daily. 08/26/24   Oley Bascom RAMAN, NP  naloxone  (NARCAN ) nasal spray 4 mg/0.1 mL Insert 1 spray into one nostril if poorly responding / turning blue. CALL 911 ASAP 10/13/23     nitroGLYCERIN  (NITROSTAT ) 0.4 MG SL tablet Place 1 tablet (0.4 mg total) under the tongue every 5 (five) minutes as needed for chest pain. Additional refills to be filled  by PCP, patient aware 07/14/24   Oley Bascom RAMAN, NP  norethindrone  (AYGESTIN ) 5 MG tablet Take 1 tablet (5 mg total) by mouth 2 (two) times daily as needed (bleeding). 06/09/24   Ajewole, Christana, MD  ondansetron  (ZOFRAN -ODT) 4 MG disintegrating tablet Take 1 tablet (4 mg total) by mouth every 8 (eight) hours as needed for nausea or vomiting. 05/27/24   Oley Bascom RAMAN, NP  Oxycodone  HCl 10 MG TABS Take 1 tablet (10 mg total) by mouth 4 (four) times daily as needed for pain 04/21/24     Oxycodone  HCl 10 MG TABS Take 1 tablet (10 mg total) by mouth 4 (four) times daily if needed for pain 05/21/24     Oxycodone   HCl 10 MG TABS Take 1 tablet (10 mg total) by mouth 4 (four) times daily as needed for pain 06/20/24 06/19/24     Oxycodone  HCl 10 MG TABS Take 1 tablet (10 mg total) by mouth 4 (four) times daily as needed for pain 08/20/24     Oxycodone  HCl 10 MG TABS Take 1 tablet (10 mg total) by mouth 5 (five) times daily as needed for pain 08/25/24     potassium chloride  SA (KLOR-CON  M) 20 MEQ tablet Take 1 tablet (20 mEq total) by mouth daily. 03/11/24   Oley Bascom RAMAN, NP  pregabalin  (LYRICA ) 150 MG capsule Take 1 capsule (150 mg total) by mouth 3 (three) times daily. 06/10/24   Raulkar, Sven SQUIBB, MD  QUEtiapine  (SEROQUEL ) 50 MG tablet Take 1 tablet (50 mg total) by mouth at bedtime and may repeat dose one time if needed. 09/09/24 09/09/25  Ezzard Staci SAILOR, NP  rosuvastatin  (CRESTOR ) 20 MG tablet Take 1 tablet (20 mg total) by mouth daily. KEEP OV. 04/25/24   Patwardhan, Newman PARAS, MD  senna (SENOKOT) 8.6 MG TABS tablet Take 1 tablet (8.6 mg total) by mouth daily. 03/30/24   Franchot Novel, MD  spironolactone  (ALDACTONE ) 25 MG tablet Take 1 tablet (25 mg total) by mouth daily. 07/22/24   Oley Bascom RAMAN, NP  sucralfate  (CARAFATE ) 1 g tablet Take 1 tablet (1 g total) by mouth 4 (four) times daily -  with meals and at bedtime. 03/11/24   Zehr, Jessica D, PA-C  tirzepatide  (MOUNJARO ) 10 MG/0.5ML Pen Inject 10 mg  into the skin once a week. 09/17/24   Thapa, Sudan, MD  tizanidine  (ZANAFLEX ) 6 MG capsule Take 1 capsule (6 mg total) by mouth 3 (three) times daily. 08/04/24   Raulkar, Sven SQUIBB, MD  torsemide  (DEMADEX ) 20 MG tablet Take 3 tablets (60 mg total) by mouth in the morning for swelling. 06/12/24       Allergies: Elavil  [amitriptyline ], Orudis [ketoprofen], Desyrel  Aurélie.binning ], Tylenol  [acetaminophen ], Ambien  [zolpidem  tartrate], Aspirin , Hydroxyzine  hcl, Motrin  [ibuprofen ], Naprosyn [naproxen], Neurontin  [gabapentin ], Sulfa antibiotics, Ultram  [tramadol ], Victoza [liraglutide], and Zegerid [omeprazole -sodium bicarbonate ]    Review of Systems  All other systems reviewed and are negative.   Updated Vital Signs BP (!) 133/95 (BP Location: Left Arm)   Pulse 93   Temp 97.7 F (36.5 C) (Oral)   Resp 18   Ht 5' 4 (1.626 m)   Wt 131.7 kg   LMP  (LMP Unknown)   SpO2 98%   BMI 49.84 kg/m   Physical Exam Vitals and nursing note reviewed.  Constitutional:      General: She is not in acute distress.    Appearance: She is well-developed. She is obese.  HENT:     Head: Atraumatic.  Eyes:     Conjunctiva/sclera: Conjunctivae normal.  Pulmonary:     Effort: Pulmonary effort is normal.  Musculoskeletal:        General: Tenderness (Left leg: Tenderness along lateral aspect of the leg with residual shingle rash still noted.  No blister.  No erythema or warmth.) present.     Cervical back: Neck supple.  Skin:    Findings: No rash.  Neurological:     Mental Status: She is alert.  Psychiatric:        Mood and Affect: Mood normal.     (all labs ordered are listed, but only abnormal results are displayed) Labs Reviewed - No data to display  EKG: None  Radiology: No results found.  Procedures   Medications Ordered in the ED  morphine  (PF) 4 MG/ML injection 6 mg (6 mg Intramuscular Given 09/21/24 2105)                                    Medical Decision Making Risk Prescription  drug management.   BP (!) 133/95 (BP Location: Left Arm)   Pulse 93   Temp 97.7 F (36.5 C) (Oral)   Resp 18   Ht 5' 4 (1.626 m)   Wt 131.7 kg   LMP  (LMP Unknown)   SpO2 98%   BMI 49.84 kg/m   45:16 PM  48 year old female with history of chronic pain including knee pain hip pain lower back pain, diabetes, hypertension presenting complaining of leg pain.  Patient states she was previously diagnosed with shingle involving her left lower extremity approximately 2 weeks ago.  She was prescribed antiviral medication that she took which seems to help with the rash.  However she still have excruciating sharp burning shooting pain to her left leg not actively controlled despite taking her oxycodone  10 mg that she had at home.  No fever no bowel bladder incontinence or saddle esthesia.  No worsening rash.  No recent trauma.  On exam patient has tenderness to her left lateral thigh with residual shingle rash noted.  Her pain is likely postherpetic neuralgia.  No signs of cellulitis.  No suspicion for DVT.  Will provide opiate pain medication and will discharge home with gabapentin .  Doubt sciatica as this pain is different.  No red flags.  Advance imaging such as MRI of lspine considered but not performed     Final diagnoses:  Post herpetic neuralgia    ED Discharge Orders          Ordered    gabapentin  (NEURONTIN ) 100 MG capsule  3 times daily        09/21/24 2300    ondansetron  (ZOFRAN ) 4 MG tablet  Every 6 hours        09/21/24 2300               Nivia Colon, PA-C 09/21/24 2302    Ula Prentice SAUNDERS, MD 09/21/24 2350

## 2024-09-21 NOTE — ED Triage Notes (Signed)
 PT to triage c/o left leg pain 10/10 throbbing in nature x weeks. PT state she has chronic pain issues and denies injury. VSS NAD PT on room air.

## 2024-09-22 ENCOUNTER — Other Ambulatory Visit: Payer: Self-pay

## 2024-09-22 ENCOUNTER — Encounter (HOSPITAL_COMMUNITY): Payer: Self-pay | Admitting: Family

## 2024-09-22 ENCOUNTER — Telehealth (HOSPITAL_COMMUNITY): Payer: Self-pay | Admitting: Family

## 2024-09-22 NOTE — Telephone Encounter (Signed)
 Patient called again today - she would like a call back about the seeroquel

## 2024-09-22 NOTE — Telephone Encounter (Cosign Needed)
 Spoke to patient on 125/05/2024 at 11:15. Patient contacted office related to the medication increased. Stated she would like to take Seroquel  200 mg for sleep.   -Patient is to continue Seroquel  50 mg nightly with one repeat as needed and follow-up with Sleep study at [facility].   - Of note:  this provider received a pharmacy query related to increased Seroquel ,  oxycodone  10 mg nightly and tizanidine  6 mg contraindicated due to respiratory depression.  Discussed concerns with patient related to respiratory depression strongly encouraged to follow-up with sleep study for further medication adjustment/ mangment.

## 2024-09-23 ENCOUNTER — Other Ambulatory Visit (HOSPITAL_COMMUNITY): Payer: Self-pay | Admitting: Family

## 2024-09-23 ENCOUNTER — Other Ambulatory Visit (HOSPITAL_COMMUNITY): Payer: Self-pay

## 2024-09-23 ENCOUNTER — Other Ambulatory Visit: Payer: Self-pay

## 2024-09-23 MED ORDER — NALOXONE HCL 4 MG/0.1ML NA LIQD
4.0000 mg | Freq: Once | NASAL | 0 refills | Status: AC
  Filled 2024-09-23: qty 2, 2d supply, fill #0

## 2024-09-23 MED ORDER — OXYCODONE HCL 10 MG PO TABS
10.0000 mg | ORAL_TABLET | Freq: Every day | ORAL | 0 refills | Status: DC
  Filled 2024-09-24: qty 35, 7d supply, fill #0
  Filled ????-??-??: fill #0

## 2024-09-23 NOTE — Addendum Note (Signed)
 Addended by: EZZARD STACI SAILOR on: 09/23/2024 10:07 AM   Modules accepted: Orders

## 2024-09-24 ENCOUNTER — Other Ambulatory Visit: Payer: Self-pay

## 2024-09-24 ENCOUNTER — Other Ambulatory Visit (HOSPITAL_COMMUNITY): Payer: Self-pay

## 2024-09-24 ENCOUNTER — Telehealth: Payer: Self-pay

## 2024-09-24 DIAGNOSIS — E1165 Type 2 diabetes mellitus with hyperglycemia: Secondary | ICD-10-CM

## 2024-09-24 DIAGNOSIS — I10 Essential (primary) hypertension: Secondary | ICD-10-CM

## 2024-09-24 MED ORDER — QUETIAPINE FUMARATE 50 MG PO TABS: 50.0000 mg | ORAL_TABLET | Freq: Every evening | ORAL | 0 refills | Status: DC | PRN

## 2024-09-24 NOTE — Telephone Encounter (Signed)
 Copied from CRM #[id number]. Topic: Clinical - Medication Question >> Sep 24, 2024 11:33 AM Harlene ORN wrote: Reason for CRM: Patient called to follow up on the whereabouts of her blood pressure cuff and scale. The Pharmacy where it was sent to is saying that they do not have it and don't know where it was sent to. Please call the pharmacy where it was sent to discuss where it is. And call the patient back afterwards to discuss.

## 2024-09-25 ENCOUNTER — Other Ambulatory Visit (HOSPITAL_COMMUNITY): Payer: Self-pay | Admitting: Family

## 2024-09-25 ENCOUNTER — Other Ambulatory Visit (HOSPITAL_COMMUNITY): Payer: Self-pay

## 2024-09-25 ENCOUNTER — Other Ambulatory Visit: Payer: Self-pay

## 2024-09-25 MED ORDER — BLOOD PRESSURE MONITORING KIT: 1.0000 | PACK | 0 refills | Status: AC

## 2024-09-25 MED ORDER — DICLOFENAC SODIUM 1 % EX GEL
2.0000 g | Freq: Four times a day (QID) | CUTANEOUS | 1 refills | Status: AC | PRN
  Filled 2024-09-25: qty 300, 20d supply, fill #0
  Filled 2024-10-28: qty 100, 6d supply, fill #0

## 2024-09-25 MED ORDER — BMI DIGITAL SMART SCALE MISC: 1.0000 | Freq: Every day | 0 refills | Status: AC

## 2024-09-26 ENCOUNTER — Other Ambulatory Visit (HOSPITAL_COMMUNITY): Payer: Self-pay

## 2024-09-29 ENCOUNTER — Other Ambulatory Visit (HOSPITAL_COMMUNITY): Payer: Self-pay

## 2024-09-29 ENCOUNTER — Other Ambulatory Visit: Payer: Self-pay

## 2024-09-29 MED FILL — Oxycodone HCl Tab 10 MG: 10.0000 mg | ORAL | 30 days supply | Qty: 150 | Fill #0 | Status: CN

## 2024-09-30 ENCOUNTER — Other Ambulatory Visit (HOSPITAL_COMMUNITY): Payer: Self-pay

## 2024-10-01 ENCOUNTER — Other Ambulatory Visit: Payer: Self-pay

## 2024-10-01 ENCOUNTER — Other Ambulatory Visit (HOSPITAL_COMMUNITY): Payer: Self-pay

## 2024-10-01 MED FILL — Oxycodone HCl Tab 10 MG: 10.0000 mg | ORAL | 30 days supply | Qty: 150 | Fill #0 | Status: AC

## 2024-10-02 ENCOUNTER — Other Ambulatory Visit: Payer: Self-pay

## 2024-10-02 NOTE — Progress Notes (Deleted)
 Patient ID: Jill Shaw, female   DOB: 06/23/1976, 48 y.o.   MRN: [id number]  Reason for Consult: No chief complaint on file.   Referred by Oley Bascom RAMAN, NP  Subjective:     HPI  Jill Shaw is a 48 y.o. female who presents for evaluation of lower extremity *** Timeframe: *** Symptoms: *** Varicosities: *** Previous wounds: *** Previous DVT: *** In compression: ***  Past Medical History:  Diagnosis Date   Abnormal uterine bleeding (AUB)    Arthritis    knees, hands   Atypical chest pain    cardiology--- dr elmira did cardiac cath 08-30-2021 showed minimal luminal irregularity involving LAD all other coronaries w/ normal  flow   Chronic diastolic (congestive) heart failure [facility])    cardiologist---- dr elmira;  preserved ef   Chronic iron  deficiency anemia    Chronic pain syndrome    followed by pain management--- dr lorilee   CKD (chronic kidney disease), stage III (HCC)    Diabetic gastroparesis (HCC)    Diabetic peripheral neuropathy (HCC)    Fibromyalgia    GAD (generalized anxiety disorder)    Generalized abdominal pain    GERD (gastroesophageal reflux disease)    History of acute renal failure 03/21/2023   admission in epic due to N/V/D due ot severe sepsis POA due to UTI   History of chronic gastritis    inflammatory   History of diabetic ketoacidosis    multiple admission's last 3 in epic 06/ 2022;  01/ 2022;   09/ 2021   History of seizure 01/2016   hypoglycemic seizure   History of vertebral compression fracture 12/2020   T11 -- T12 & L1   Hyperlipidemia    Hypertension    followed by pcp   Insulin  dependent type 2 diabetes mellitus (HCC)    uncontrolled,  followed by pcp   Irritable bowel syndrome with constipation    MDD (major depressive disorder)    Moderate COPD (chronic obstructive pulmonary disease) (HCC)    pulmology--- dr brenna   Moderate persistent asthma    Sickle cell trait    Vitamin D  deficiency 10/2019   Wears  glasses    Family History  Problem Relation Age of Onset   Diabetes Mother    Hypertension Mother    Colon cancer Maternal Grandfather    Migraines Paternal Grandfather    Colon cancer Maternal Aunt    Breast cancer Maternal Aunt    Esophageal cancer Neg Hx    Stomach cancer Neg Hx    Rectal cancer Neg Hx    Past Surgical History:  Procedure Laterality Date   CATARACT EXTRACTION W/ INTRAOCULAR LENS IMPLANT Left    CESAREAN SECTION  2001   for twins   DILATION AND CURETTAGE OF UTERUS N/A 08/20/2019   Procedure: DILATATION AND CURETTAGE;  Surgeon: Starla Harland BROCKS, MD;  Location: [facility] OR;  Service: Gynecology;  Laterality: N/A;   DILATION AND CURETTAGE OF UTERUS  05/01/2023   Procedure: DILATATION AND CURETTAGE;  Surgeon: Jeralyn Crutch, MD;  Location: [facility];  Service: Gynecology;;   ENDOMETRIAL ABLATION N/A 08/20/2019   Procedure: Lela Ablation;  Surgeon: Starla Harland BROCKS, MD;  Location: [facility] OR;  Service: Gynecology;  Laterality: N/A;   EYE SURGERY Bilateral    laser right and cataract removed left eye   HYSTEROSCOPY N/A 05/01/2023   Procedure: HYSTEROSCOPY;  Surgeon: Jeralyn Crutch, MD;  Location: [facility];  Service: Gynecology;  Laterality: N/A;  INTRAUTERINE DEVICE (IUD) INSERTION N/A 05/01/2023   Procedure: INTRAUTERINE DEVICE (IUD) INSERTION;  Surgeon: Jeralyn Crutch, MD;  Location: [facility];  Service: Gynecology;  Laterality: N/A;   LEFT HEART CATH AND CORONARY ANGIOGRAPHY N/A 08/30/2021   Procedure: LEFT HEART CATH AND CORONARY ANGIOGRAPHY;  Surgeon: Elmira Newman PARAS, MD;  Location: [facility];  Service: Cardiovascular;  Laterality: N/A;   RADIOLOGY WITH ANESTHESIA N/A 09/16/2019   Procedure: MRI WITH ANESTHESIA   L SPINE WITHOUT CONTRAST, T SPINE WITHOUT CONTRAST , CERVICAL WITHOUT CONTRAST;  Surgeon: Radiologist, Medication, MD;  Location: [facility];  Service: Radiology;  Laterality: N/A;   TOOTH EXTRACTION  N/A 01/14/2024   Procedure: DENTAL RESTORATION/EXTRACTIONS;  Surgeon: Sheryle Hamilton, DMD;  Location: [facility];  Service: Oral Surgery;  Laterality: N/A;   TUBAL LIGATION     interval BTL   UPPER GI ENDOSCOPY  07/2017    Short Social History:  Social History   Tobacco Use   Smoking status: Some Days    Current packs/day: 0.00    Average packs/day: 0.3 packs/day for 26.0 years (6.5 ttl pk-yrs)    Types: Cigarettes    Start date: 09/13/1994    Last attempt to quit: 09/13/2020    Years since quitting: 4.0   Smokeless tobacco: Never   Tobacco comments:    Pt smokes 1/2 ppd. AB. CMA 09-10-23  Substance Use Topics   Alcohol use: No    Allergies[1]  Current Outpatient Medications  Medication Sig Dispense Refill   Accu-Chek Softclix Lancets lancets Use as instructed to monitor blood sugar 4 times daily 300 each 4   albuterol  (PROVENTIL ) (2.5 MG/3ML) 0.083% nebulizer solution Take 3 mLs (2.5 mg total) by nebulization every 6 (six) hours as needed for wheezing or shortness of breath. 75 mL 2   albuterol  (VENTOLIN  HFA) 108 (90 Base) MCG/ACT inhaler Inhale 2 puffs into the lungs every 6 (six) hours as needed for wheezing or shortness of breath. 8.5 g 3   benzonatate  (TESSALON ) 100 MG capsule Take 1 capsule (100 mg total) by mouth 3 (three) times daily as needed for cough. 20 capsule 0   Blood Glucose Monitoring Suppl (ACCU-CHEK GUIDE) w/Device KIT Use as directed to monitor blood sugar 4 times daily (before each meal and before bed). 1 kit 0   Blood Pressure Monitoring KIT 1 each by Does not apply route 3 (three) times a week. 1 kit 0   budesonide -formoterol  (SYMBICORT ) 160-4.5 MCG/ACT inhaler Inhale 2 puffs into the lungs 2 (two) times daily. 10.2 g 5   busPIRone  (BUSPAR ) 10 MG tablet Take 1 tablet (10 mg total) by mouth 2 (two) times daily. 60 tablet 2   Continuous Glucose Sensor (FREESTYLE LIBRE 3 PLUS SENSOR) MISC Change sensor every 15 days. (Patient not taking: Reported on 09/08/2024) 2  each 11   Continuous Glucose Sensor (FREESTYLE LIBRE 3 PLUS SENSOR) MISC Change every 15 days. (Patient not taking: Reported on 09/08/2024) 6 each 3   Dexlansoprazole  30 MG capsule DR Take 1 capsule (30 mg total) by mouth daily. NEEDS OFFICE VISIT FOR ADDITIONAL REFILLS 90 capsule 1   diclofenac  Sodium (VOLTAREN ) 1 % GEL Apply 2 to 4 gram to painful sites up to 4 times daily if needed, max daily dose: 32 Gram 300 g 1   diclofenac  Sodium (VOLTAREN ) 1 % GEL apply 2 to 4 gram to painful sites up to 4 times daily if needed, max daily dose: 32 Gram 300 g 1   diclofenac  Sodium (  VOLTAREN ) 1 % GEL Apply 2-4 grams topically up to 4 (four) times daily as needed. max 32 gm daily 300 g 1   diclofenac  Sodium (VOLTAREN ) 1 % GEL Apply 2-4 g topically 4 (four) times daily as needed. 300 g 1   dicyclomine  (BENTYL ) 20 MG tablet Take 1 tablet (20 mg total) by mouth 2 (two) times daily. 60 tablet 3   empagliflozin  (JARDIANCE ) 25 MG TABS tablet Take 1 tablet (25 mg total) by mouth daily before breakfast. 90 tablet 3   ferrous sulfate  325 (65 FE) MG tablet Take 1 tablet (325 mg total) by mouth daily with breakfast. 30 tablet 0   gabapentin  (NEURONTIN ) 100 MG capsule Take 1 capsule (100 mg total) by mouth 3 (three) times daily. 60 capsule 0   glucose blood (ACCU-CHEK GUIDE TEST) test strip Use as instructed to monitor blood sugar 4 times daily 300 each 4   guaiFENesin  (MUCINEX ) 600 MG 12 hr tablet Take 1 tablet (600 mg total) by mouth 2 (two) times daily. 60 tablet 2   GVOKE HYPOPEN  2-PACK 1 MG/0.2ML SOAJ Inject 1 mg into the skin as needed (severe hypoglycemia with not able to take oral medication or severe alterned mental status / unconcious.). 0.4 mL 1   hydrALAZINE  (APRESOLINE ) 25 MG tablet Take 1 tablet (25 mg total) by mouth 3 (three) times daily. 90 tablet 0   insulin  glargine, 2 Unit Dial , (TOUJEO  MAX SOLOSTAR) 300 UNIT/ML Solostar Pen Inject 70 Units into the skin daily. May increase up to 100 units daily if  instructed by your provider. 18 mL 5   insulin  lispro (HUMALOG  KWIKPEN) 100 UNIT/ML KwikPen Inject 30 Units into the skin with breakfast, with lunch, and with evening meal. May increase up to 40 units with each meal if instructed by your provider. 36 mL 5   Insulin  Pen Needle (PEN NEEDLES) 31G X 5 MM MISC Use as instructed to inject insulin  four times day 200 each 3   levocetirizine (XYZAL) 5 MG tablet Take 5 mg by mouth every evening.     losartan  (COZAAR ) 25 MG tablet 1 tab by mouth daily 90 tablet 3   Melatonin 10 MG TABS Take 10 mg by mouth at bedtime as needed (insomnia). (Patient not taking: Reported on 09/08/2024) 30 tablet 0   metFORMIN  (GLUCOPHAGE -XR) 500 MG 24 hr tablet Take 2 tablets (1,000 mg total) by mouth 2 (two) times daily with a meal. 360 tablet 1   metoprolol  succinate (TOPROL -XL) 100 MG 24 hr tablet Take 1 tablet (100 mg total) by mouth daily. Take with or immediately following a meal. 90 tablet 0   Misc. Devices (BMI DIGITAL SMART SCALE) MISC 1 each by Does not apply route daily. 1 each 0   naloxone  (NARCAN ) nasal spray 4 mg/0.1 mL Insert 1 spray into one nostril if poorly responding / turning blue. CALL 911 ASAP 2 each 0   nitroGLYCERIN  (NITROSTAT ) 0.4 MG SL tablet Place 1 tablet (0.4 mg total) under the tongue every 5 (five) minutes as needed for chest pain. Additional refills to be filled by PCP, patient aware 25 tablet 0   norethindrone  (AYGESTIN ) 5 MG tablet Take 1 tablet (5 mg total) by mouth 2 (two) times daily as needed (bleeding). 90 tablet 8   ondansetron  (ZOFRAN ) 4 MG tablet Take 1 tablet (4 mg total) by mouth every 6 (six) hours. 12 tablet 0   ondansetron  (ZOFRAN -ODT) 4 MG disintegrating tablet Take 1 tablet (4 mg total) by mouth every 8 (eight)  hours as needed for nausea or vomiting. 20 tablet 0   Oxycodone  HCl 10 MG TABS Take 1 tablet (10 mg total) by mouth 4 (four) times daily as needed for pain 120 tablet 0   Oxycodone  HCl 10 MG TABS Take 1 tablet (10 mg total) by  mouth 4 (four) times daily if needed for pain 120 tablet 0   Oxycodone  HCl 10 MG TABS Take 1 tablet (10 mg total) by mouth 4 (four) times daily as needed for pain 06/20/24 12 tablet 0   Oxycodone  HCl 10 MG TABS Take 1 tablet (10 mg total) by mouth 4 (four) times daily as needed for pain 12 tablet 0   Oxycodone  HCl 10 MG TABS Take 1 tablet (10 mg total) by mouth 5 (five) times daily as needed for pain 150 tablet 0   potassium chloride  SA (KLOR-CON  M) 20 MEQ tablet Take 1 tablet (20 mEq total) by mouth daily. 30 tablet 0   pregabalin  (LYRICA ) 150 MG capsule Take 1 capsule (150 mg total) by mouth 3 (three) times daily. 270 capsule 3   QUEtiapine  (SEROQUEL ) 50 MG tablet Take 1 tablet (50 mg total) by mouth at bedtime and may repeat dose one time if needed. 60 tablet 0   rosuvastatin  (CRESTOR ) 20 MG tablet Take 1 tablet (20 mg total) by mouth daily. KEEP OV. 90 tablet 0   senna (SENOKOT) 8.6 MG TABS tablet Take 1 tablet (8.6 mg total) by mouth daily. 120 tablet 0   spironolactone  (ALDACTONE ) 25 MG tablet Take 1 tablet (25 mg total) by mouth daily. 90 tablet 0   sucralfate  (CARAFATE ) 1 g tablet Take 1 tablet (1 g total) by mouth 4 (four) times daily -  with meals and at bedtime. 120 tablet 3   tirzepatide  (MOUNJARO ) 10 MG/0.5ML Pen Inject 10 mg into the skin once a week. 2 mL 11   tizanidine  (ZANAFLEX ) 6 MG capsule Take 1 capsule (6 mg total) by mouth 3 (three) times daily. 90 capsule 3   torsemide  (DEMADEX ) 20 MG tablet Take 3 tablets (60 mg total) by mouth in the morning for swelling. 270 tablet 3   No current facility-administered medications for this visit.    REVIEW OF SYSTEMS All other systems were reviewed and are negative    Objective:  Objective   There were no vitals filed for this visit. There is no height or weight on file to calculate BMI.  Physical Exam General: no acute distress Cardiac: hemodynamically stable Extremities: *** Vascular:   Right: palpable DP, PT***  Left:  palpable DP, PT***   Data: Reflux study ***      Assessment/Plan:     Jill Shaw is a 48 y.o. female with chronic venous insufficiency with C*** disease and reflux noted in *** I explained the foundation of CVI treatment of compression and elevation I recommended medical grade graduated compression stockings and intermittent leg elevation We also discussed that many patients find symptom improvement with exercise and if they have access to a pool should attempt water aerobics.  Plan to follow up in 3 months with Dr. Serene or Dr. Sheree with a *** reflux study in order to complete bilateral imaging      Norman GORMAN Serve MD [facility]    [1]  Allergies Allergen Reactions   Elavil  [Amitriptyline ] Other (See Comments)    Coma   Orudis [Ketoprofen] Nausea And Vomiting   Desyrel  [Trazodone ] Nausea And Vomiting   Tylenol  [Acetaminophen ]  Nausea And Vomiting   Ambien  [Zolpidem  Tartrate] Nausea And Vomiting    She also reported headache   Aspirin  Nausea Only   Hydroxyzine  Hcl Rash   Motrin  [Ibuprofen ] Nausea And Vomiting   Naprosyn [Naproxen] Nausea And Vomiting   Neurontin  [Gabapentin ] Nausea And Vomiting and Other (See Comments)    upset stomach   Sulfa Antibiotics Nausea And Vomiting   Ultram  [Tramadol ] Nausea And Vomiting and Other (See Comments)    stomach upset   Victoza [Liraglutide] Nausea And Vomiting   Zegerid [Omeprazole -Sodium Bicarbonate ] Nausea And Vomiting

## 2024-10-03 ENCOUNTER — Telehealth: Payer: Self-pay | Admitting: Nurse Practitioner

## 2024-10-03 ENCOUNTER — Other Ambulatory Visit: Payer: Self-pay

## 2024-10-03 ENCOUNTER — Ambulatory Visit: Admitting: Vascular Surgery

## 2024-10-03 ENCOUNTER — Ambulatory Visit (HOSPITAL_COMMUNITY): Admission: RE | Admit: 2024-10-03

## 2024-10-03 DIAGNOSIS — E1169 Type 2 diabetes mellitus with other specified complication: Secondary | ICD-10-CM

## 2024-10-03 NOTE — Telephone Encounter (Signed)
 Copied from CRM #[id number]. Topic: General - Other >> Oct 03, 2024 12:54 PM Rachelle R wrote: Reason for CRM: Patient calling to confirm if a fax was received for her to get a scale and a BP cuff. Is requesting a callback to verify.  Patient can be reached at [phone]

## 2024-10-03 NOTE — Progress Notes (Signed)
 "  10/03/2024 Name: Jill Shaw MRN: [id number] DOB: [birth year]  Chief Complaint  Patient presents with   Diabetes    Jill Shaw is a 48 y.o. year old female who presented for a telephone visit.   They were referred to the pharmacist by their PCP for assistance in managing diabetes. PMH includes HFpEF, HTN, COPD, IBS, gastroparesis, T2DM (hx of DKA), hypoglycemia unawareness, HLD   Subjective: Patient was  admitted at [facility] from 03/27/24 to 03/30/24 in the setting of seizure 2/2 hypoglycemia (BG of 34 mg/dL). This was a recurrent seizure, in the setting of hypoglycemia. She did have a R humerus fracture due to the seizure. She was discharged with reduced dose of insulin  (PTA was on Toujeo  U300 60 units daily and Humalog  U200 60 units TID before meals). Since then, patient's insulin  dose has been slowly titrated back up as patient has reported persistent and symptomatic hyperglycemia. Additionally, she has started Mounjaro  within the past several months. At pharmacy call on 06/27/24 patient was instructed to take Toujeo  U300 80 units once daily, Humalog  40 units TID with meals, Mounjaro  5 mg weekly, Jardiance  10 mg daily, and metformin  XR 1000 mg BID. In the meantime, we have tried to obtain a CGM for her. She was able to see Dr. Mercie (Endo) on 08/06/24. A1C was 7.4%. She was instructed to increase Mounjaro  to 7.5 mg weekly and at that time decrease Toujeo  to 70 units daily and decrease Humalog  to 30 units TID with meals. She was also instructed to increase Jardiance  to 25 mg daily and continue metformin  as prescribed. At pharmacy call on 08/05/24, we reiterated instructions given at Endo and encouraged her to pick up CGM. Staff at [facility] ensured she had the app downloaded on her phone.  Today, patient reports doing ok. States her BG are running higher this past week. She is wondering if she has an infection in her blood. She denies fever, cough, skin abnormalities, pain or burning with urination.  She is having some nausea but this is present at baseline for her. Insists she is barely eating anything, but is not willing to complete food recall from yesterday. States she did have a heavy dinner last night and regular breakfast this AM. She also reports she is having at least 3 cups of ginger ale per day. She reports initial CGM she tried to apply did not work. She is willing to try again.  \ Care Team: Primary Care Provider: Oley Bascom RAMAN, NP ; Next Scheduled Visit: Needs to be scheduled Endocrinologist Dr. Mercie; Next Scheduled Visit: 11/07/23  Medication Access/Adherence  Current Pharmacy:  [id number] - [place], [place] - [address] AT [facility] [address] [place] [address] Phone: [phone] Fax: [phone]  [facility] [address], [address] [place] [address] Phone: [phone] Fax: [phone]   Patient reports affordability concerns with their medications: No  Patient reports access/transportation concerns to their pharmacy: No  Patient reports adherence concerns with their medications:  Yes  - hx of self adjusting insulin   Diabetes:  Current medications: Toujeo  U300 Max Solostar (insulin  glargine) 70 units daily (still taking 80 units - in the evening), Humalog  (insulin  lispro) 30 units three times daily before meals (reports she is still taking 30-40 units TID), Jardiance  25 mg daily, metformin  XR 1000 mg BID, Mounjaro  10 mg weekly (Tuesdays)  Medications tried in the past: Victoza (  nausea/vomiting) - dx of gastroparesis - however GI reviewed and patient had a normal gastric emptying study in 2022  Using Accu Chek meter; testing 2-3 times daily Used 1 FL3+ CGM. Had difficulty with accuracy. Willing to pick up refill in case her sensors were part of the refill.  Current glucose readings:  09/29/24: AM 247 mg/dL, PM 675 mg/dL, PM 613  mg/dL 87/83/74: AM 447 mg/dL, PM 613 mg/dL, PM 579 mg/dL 87/82/74: AM 772 mg/dL, PM 613 mg/dL, PM 653 mg/dL 87/81/74: AM 552 mg/dL, PM 623 mg/dL, PM 709 mg/dL 87/80/74: AM 623 mg/dL   Limited CGM data available - last connected on 09/08/24. States that when she double checked with her glucometer her sugars were not actually low (ex: sensor says 60 mg/dL - finger stick read 888 or 120 mg/dL). Had to use 4 sensors before she got this one to work, and it only lasted a few days.    Patient denies hypoglycemic s/sx including dizziness, shakiness, sweating - patient has hypoglycemia unawareness - had CBG of 50 > 52 in ED on 08/09/24. Patient reports hyperglycemic symptoms including polydipsia. Denies blurry vision, frequent urination.   Current meal patterns: She is eating 2-3 times per day. Reports appetite is reduced. Not willing to do food recall from yesterday. States she had a heavy dinner last night. States she is having at least 3 cups of ginger ale per day (regular soda).   Heart Failure with preserved EF (EF 60-65%):  Current medications:  ACEi/ARB/ARNI: losartan  25 mg daily  SGLT2i: Jardiance  10 mg daily Beta blocker: metoprolol  succinate 100 mg daily Mineralocorticoid Receptor Antagonist: spironolactone  25 mg daily Diuretic regimen: torsemide  60 mg daily (switched from furosemide  by nephrology) Potassium 20 mEq daily (last filled 03/12/24 for 30ds)  Current home weights: does not have scale - working with [facility] to refax orders  Objective:  BP Readings from Last 3 Encounters:  09/21/24 (!) 136/58  09/08/24 (!) 149/69  08/15/24 122/61    Lab Results  Component Value Date   HGBA1C 7.4 (A) 08/06/2024   HGBA1C 9.2 (H) 03/27/2024   HGBA1C 8.9 (A) 02/25/2024       Latest Ref Rng & Units 08/09/2024    3:54 AM 08/06/2024    1:56 PM 07/31/2024   10:39 PM  BMP  Glucose 70 - 99 mg/dL 49  768  43   BUN 6 - 20 mg/dL 14  15  9    Creatinine 0.44 - 1.00 mg/dL 8.36  8.73  8.84    BUN/Creat Ratio 6 - 22 (calc)  12    Sodium 135 - 145 mmol/L 138  141  140   Potassium 3.5 - 5.1 mmol/L 3.5  4.2  3.6   Chloride 98 - 111 mmol/L 106  109  104   CO2 22 - 32 mmol/L 22  22  26    Calcium  8.9 - 10.3 mg/dL 9.3  89.6  89.4     Lab Results  Component Value Date   CHOL 153 12/09/2021   HDL 54 12/09/2021   LDLCALC 74 12/09/2021   TRIG 144 12/09/2021   CHOLHDL 2.8 12/09/2021    Medications Reviewed Today     Reviewed by Brinda Lorain SQUIBB, RPH-CPP (Pharmacist) on 10/03/24 at 1211  Med List Status: <None>   Medication Order Taking? Sig Documenting Provider Last Dose Status Informant  Accu-Chek Softclix Lancets lancets [id number]  Use as instructed to monitor blood sugar 4 times daily Nichols, Tonya S, NP  Active   albuterol  (PROVENTIL ) (  2.5 MG/3ML) 0.083% nebulizer solution [id number]  Take 3 mLs (2.5 mg total) by nebulization every 6 (six) hours as needed for wheezing or shortness of breath. Parrett, Madelin RAMAN, NP  Active Self, Pharmacy Records  albuterol  (VENTOLIN  HFA) 108 (90 Base) MCG/ACT inhaler [id number]  Inhale 2 puffs into the lungs every 6 (six) hours as needed for wheezing or shortness of breath. Oley Bascom RAMAN, NP  Active Self, Pharmacy Records  benzonatate  (TESSALON ) 100 MG capsule [id number]  Take 1 capsule (100 mg total) by mouth 3 (three) times daily as needed for cough. Oley Bascom RAMAN, NP  Active   Blood Glucose Monitoring Suppl (ACCU-CHEK GUIDE) w/Device KIT [id number]  Use as directed to monitor blood sugar 4 times daily (before each meal and before bed). Oley Bascom RAMAN, NP  Active   Blood Pressure Monitoring KIT [id number]  1 each by Does not apply route 3 (three) times a week. Nichols, Tonya S, NP  Active   budesonide -formoterol  (SYMBICORT ) 160-4.5 MCG/ACT inhaler [id number]  Inhale 2 puffs into the lungs 2 (two) times daily. Oley Bascom RAMAN, NP  Active Self, Pharmacy Records           Med Note JACKOLYN WADDELL VEAR [place] Mar 27, 2024  7:47 AM)    busPIRone  (BUSPAR )  10 MG tablet [id number]  Take 1 tablet (10 mg total) by mouth 2 (two) times daily. Oley Bascom RAMAN, NP  Active Self, Pharmacy Records  Continuous Glucose Sensor (FREESTYLE LIBRE 3 PLUS SENSOR) [place] [id number]  Change sensor every 15 days.  Patient not taking: Reported on 09/08/2024   Oley Bascom RAMAN, NP  Active   Continuous Glucose Sensor (FREESTYLE LIBRE 3 PLUS SENSOR) [place] [id number]  Change every 15 days.  Patient not taking: Reported on 09/08/2024   Thapa, Sudan, MD  Active   Dexlansoprazole  30 MG capsule DR [id number]  Take 1 capsule (30 mg total) by mouth daily. NEEDS OFFICE VISIT FOR ADDITIONAL REFILLS Oley Bascom RAMAN, NP  Active   diclofenac  Sodium (VOLTAREN ) 1 % GEL [id number]  Apply 2 to 4 gram to painful sites up to 4 times daily if needed, max daily dose: 32 Gram   Active Self, Pharmacy Records  diclofenac  Sodium (VOLTAREN ) 1 % GEL [id number]  apply 2 to 4 gram to painful sites up to 4 times daily if needed, max daily dose: 32 Gram   Active   diclofenac  Sodium (VOLTAREN ) 1 % GEL [id number]  Apply 2-4 grams topically up to 4 (four) times daily as needed. max 32 gm daily   Active   diclofenac  Sodium (VOLTAREN ) 1 % GEL [id number]  Apply 2-4 g topically 4 (four) times daily as needed.   Active   dicyclomine  (BENTYL ) 20 MG tablet [id number]  Take 1 tablet (20 mg total) by mouth 2 (two) times daily. Oley Bascom RAMAN, NP  Active Self, Pharmacy Records  empagliflozin  (JARDIANCE ) 25 MG TABS tablet [id number] Yes Take 1 tablet (25 mg total) by mouth daily before breakfast. Thapa, Sudan, MD  Active   ferrous sulfate  325 (65 FE) MG tablet [id number]  Take 1 tablet (325 mg total) by mouth daily with breakfast. Franchot Novel, MD  Active   gabapentin  (NEURONTIN ) 100 MG capsule [id number]  Take 1 capsule (100 mg total) by mouth 3 (three) times daily. Nivia Colon, PA-C  Active   glucose blood (ACCU-CHEK GUIDE TEST) test strip [id number]  Use as instructed to monitor blood sugar 4 times daily Nichols, Tonya S,  NP  Active   guaiFENesin  (  MUCINEX ) 600 MG 12 hr tablet [id number]  Take 1 tablet (600 mg total) by mouth 2 (two) times daily. Oley Bascom RAMAN, NP  Active   GVOKE HYPOPEN  2-PACK 1 MG/0.2ML EMMANUEL [id number]  Inject 1 mg into the skin as needed (severe hypoglycemia with not able to take oral medication or severe alterned mental status / unconcious.). Thapa, Sudan, MD  Active   hydrALAZINE  (APRESOLINE ) 25 MG tablet [id number]  Take 1 tablet (25 mg total) by mouth 3 (three) times daily. Oley Bascom RAMAN, NP  Active   insulin  glargine, 2 Unit Dial , (TOUJEO  MAX SOLOSTAR) 300 UNIT/ML Solostar Pen [id number] Yes Inject 70 Units into the skin daily. May increase up to 100 units daily if instructed by your provider. Thapa, Sudan, MD  Active   insulin  lispro (HUMALOG  KWIKPEN) 100 UNIT/ML KwikPen [id number] Yes Inject 30 Units into the skin with breakfast, with lunch, and with evening meal. May increase up to 40 units with each meal if instructed by your provider. Thapa, Sudan, MD  Active   Insulin  Pen Needle (PEN NEEDLES) 31G X 5 MM MISC [id number]  Use as instructed to inject insulin  four times day Nichols, Tonya S, NP  Active   levocetirizine (XYZAL) 5 MG tablet [id number]  Take 5 mg by mouth every evening. [provider]  Active Self, Pharmacy Records  losartan  (COZAAR ) 25 MG tablet [id number] Yes 1 tab by mouth daily   Active   Melatonin 10 MG TABS [id number]  Take 10 mg by mouth at bedtime as needed (insomnia).  Patient not taking: Reported on 09/08/2024   Patsy Lenis, MD  Active Self, Pharmacy Records  metFORMIN  (GLUCOPHAGE -XR) 500 MG 24 hr tablet [id number]  Take 2 tablets (1,000 mg total) by mouth 2 (two) times daily with a meal. [name], [name], FNP  Active   metoprolol  succinate (TOPROL -XL) 100 MG 24 hr tablet [id number] Yes Take 1 tablet (100 mg total) by mouth daily. Take with or immediately following a meal. Oley Bascom RAMAN, NP  Active   Misc. Devices (BMI DIGITAL SMART SCALE) MISC [id number]  1  each by Does not apply route daily. Oley Bascom RAMAN, NP  Active   naloxone  (NARCAN ) nasal spray 4 mg/0.1 mL [id number]  Insert 1 spray into one nostril if poorly responding / turning blue. CALL 911 ASAP   Active Self, Pharmacy Records  nitroGLYCERIN  (NITROSTAT ) 0.4 MG SL tablet [id number]  Place 1 tablet (0.4 mg total) under the tongue every 5 (five) minutes as needed for chest pain. Additional refills to be filled by PCP, patient aware Oley Bascom RAMAN, NP  Active   norethindrone  (AYGESTIN ) 5 MG tablet [id number]  Take 1 tablet (5 mg total) by mouth 2 (two) times daily as needed (bleeding). Ajewole, Christana, MD  Active   ondansetron  (ZOFRAN ) 4 MG tablet [id number]  Take 1 tablet (4 mg total) by mouth every 6 (six) hours. Nivia Colon, PA-C  Active   ondansetron  (ZOFRAN -ODT) 4 MG disintegrating tablet [id number]  Take 1 tablet (4 mg total) by mouth every 8 (eight) hours as needed for nausea or vomiting. Oley Bascom RAMAN, NP  Active   Oxycodone  HCl 10 MG TABS [id number]  Take 1 tablet (10 mg total) by mouth 4 (four) times daily as needed for pain   Active   Oxycodone  HCl 10 MG TABS [id number]  Take 1 tablet (10 mg total) by mouth 4 (four) times daily if needed for pain   Active   Oxycodone  HCl 10 MG TABS [id number]  Take  1 tablet (10 mg total) by mouth 4 (four) times daily as needed for pain 06/20/24   Active   Oxycodone  HCl 10 MG TABS [id number]  Take 1 tablet (10 mg total) by mouth 4 (four) times daily as needed for pain   Active   Oxycodone  HCl 10 MG TABS [id number]  Take 1 tablet (10 mg total) by mouth 5 (five) times daily as needed for pain   Active   potassium chloride  SA (KLOR-CON  M) 20 MEQ tablet [id number]  Take 1 tablet (20 mEq total) by mouth daily. Oley Bascom RAMAN, NP  Active Self, Pharmacy Records  pregabalin  (LYRICA ) 150 MG capsule [id number]  Take 1 capsule (150 mg total) by mouth 3 (three) times daily. Lorilee Sven SQUIBB, MD  Active   QUEtiapine  (SEROQUEL ) 50 MG tablet [id number]  Take 1 tablet (50  mg total) by mouth at bedtime and may repeat dose one time if needed. Ezzard Staci SAILOR, NP  Active   rosuvastatin  (CRESTOR ) 20 MG tablet [id number]  Take 1 tablet (20 mg total) by mouth daily. KEEP OV. Patwardhan, Newman PARAS, MD  Active   senna (SENOKOT) 8.6 MG TABS tablet [id number]  Take 1 tablet (8.6 mg total) by mouth daily. Franchot Novel, MD  Active   spironolactone  (ALDACTONE ) 25 MG tablet [id number] Yes Take 1 tablet (25 mg total) by mouth daily. Oley Bascom RAMAN, NP  Active   sucralfate  (CARAFATE ) 1 g tablet [id number]  Take 1 tablet (1 g total) by mouth 4 (four) times daily -  with meals and at bedtime. Zehr, Jessica D, PA-C  Active Self, Pharmacy Records  tirzepatide  (MOUNJARO ) 10 MG/0.5ML Pen [id number] Yes Inject 10 mg into the skin once a week. Thapa, Sudan, MD  Active   tizanidine  (ZANAFLEX ) 6 MG capsule [id number]  Take 1 capsule (6 mg total) by mouth 3 (three) times daily. Lorilee Sven SQUIBB, MD  Active   torsemide  (DEMADEX ) 20 MG tablet [id number] Yes Take 3 tablets (60 mg total) by mouth in the morning for swelling.   Active               Assessment/Plan:    Diabetes: - Currently controlled with most recent A1C of 7.4% below goal <8% given recurrent hypoglycemia with seizures. Could consider lower A1C goal if we are able to achieve it without hypoglycemia, however given recent ED visits with incidental hypoglycemia it appears patient continues to have hypoglycemia unawareness. There was also hypoglycemia on her 3 days of CGM back in Nov, but patient states this was not accurate and denies any BG < 70 mg/dL on her glucometer. For the past week, reports BG ranging 200s-400s in the setting of increased soda intake. Advised her to STOP intake of all sugary beverages. Advised her to reduce insulin  doses to what was recommended at previous appointments. Encouraged reapplication of CGM. Worked with [facility] pharmacy to request refill.  - Last UACR 02/25/24: 146 mg/g. Patient is treated with  SGLT2i - need to continue to monitor closely for development of GU infections given risk factors of female sex, obesity, and poor glycemic control.  - Reviewed long term cardiovascular and renal outcomes of uncontrolled blood sugar - Reviewed goal A1c, goal fasting, and goal 2 hour post prandial glucose - Reviewed hypoglycemia management plan and the rule of 15 - Reviewed dietary modifications including  utilizing the healthy plate method, limiting portion size of carbohydrate foods, increasing intake of protein and non-starchy vegetables. Counseled patient to stay hydrated with water throughout the day. -  Recommend to continue Jardiance  25 mg daily - Recommend to continue metformin  XR to 1000 mg BID. Continue to monitor eGFR to ensure it stays > 45 mL/min - Recommend to decrease Toujeo  U300 to 70 units once daily in the evening as instructed by Endo - Recommend to decrease Humalog  to 30 units TID with meals as instructed by Endo - Recommend to continue Mounjaro  to 10 mg weekly  - Recommend to drink water only and avoid sugar containing beverages - Recommend to eat a small meal that contains protein three time daily, regardless of blood sugar.  - Encouraged patient to pick up FL3+ from [facility] pharmacy ($0 copay). - Next A1C due 11/07/23   Patient verbalized understanding of treatment plan.   Follow Up Plan:  PCP needs to be scheduled Pharmacist telephone 10/24/24 Endo 11/06/24 - confirmed she could make this appt   Lorain Baseman, PharmD [facility] [phone]   "

## 2024-10-03 NOTE — Telephone Encounter (Signed)
 Copied from CRM #[id number]. Topic: Clinical - Medication Question >> Sep 24, 2024 11:33 AM Harlene ORN wrote: Reason for CRM: Patient called to follow up on the whereabouts of her blood pressure cuff and scale. The Pharmacy where it was sent to is saying that they do not have it and don't know where it was sent to. Please call the pharmacy where it was sent to discuss where it is. And call the patient back afterwards to discuss.

## 2024-10-06 ENCOUNTER — Other Ambulatory Visit: Payer: Self-pay

## 2024-10-06 NOTE — Telephone Encounter (Signed)
 Pt was called and advised form was here in the office and will be signed once pcp returns. Jill Shaw

## 2024-10-06 NOTE — Telephone Encounter (Signed)
 Pt was called and advised we have the form for b/p  and will check with provider for the form for pt scale. KH

## 2024-10-07 ENCOUNTER — Telehealth: Payer: Self-pay | Admitting: Nurse Practitioner

## 2024-10-07 NOTE — Telephone Encounter (Signed)
 Copied from CRM #[id number]. Topic: General - Other >> Oct 03, 2024 12:54 PM Rachelle R wrote: Reason for CRM: Patient calling to confirm if a fax was received for her to get a scale and a BP cuff. Is requesting a callback to verify.  Patient can be reached at [phone]

## 2024-10-07 NOTE — Telephone Encounter (Signed)
 Spoke to pt yesterday advise of sig needed from provider who is out of the office. KH

## 2024-10-10 ENCOUNTER — Other Ambulatory Visit (HOSPITAL_COMMUNITY): Payer: Self-pay

## 2024-10-15 ENCOUNTER — Other Ambulatory Visit: Payer: Self-pay

## 2024-10-16 ENCOUNTER — Other Ambulatory Visit: Payer: Self-pay | Admitting: Nurse Practitioner

## 2024-10-16 DIAGNOSIS — I5032 Chronic diastolic (congestive) heart failure: Secondary | ICD-10-CM

## 2024-10-17 ENCOUNTER — Other Ambulatory Visit: Payer: Self-pay

## 2024-10-17 MED ORDER — HYDRALAZINE HCL 25 MG PO TABS
25.0000 mg | ORAL_TABLET | Freq: Three times a day (TID) | ORAL | 0 refills | Status: AC
  Filled 2024-10-17: qty 90, 30d supply, fill #0

## 2024-10-17 MED ORDER — SPIRONOLACTONE 25 MG PO TABS
25.0000 mg | ORAL_TABLET | Freq: Every day | ORAL | 0 refills | Status: AC
  Filled 2024-10-17 (×2): qty 90, 90d supply, fill #0

## 2024-10-17 MED ORDER — METOPROLOL SUCCINATE ER 100 MG PO TB24
100.0000 mg | ORAL_TABLET | Freq: Every day | ORAL | 0 refills | Status: AC
  Filled 2024-10-17 (×2): qty 90, 90d supply, fill #0

## 2024-10-17 NOTE — Telephone Encounter (Signed)
 metoprolol  succinate (TOPROL -XL) 100 MG 24 hr tablet      spironolactone  (ALDACTONE ) 25 MG tablet      hydrALAZINE  (APRESOLINE ) 25 MG tablet

## 2024-10-20 ENCOUNTER — Other Ambulatory Visit: Payer: Self-pay

## 2024-10-20 ENCOUNTER — Other Ambulatory Visit (HOSPITAL_COMMUNITY): Payer: Self-pay

## 2024-10-20 ENCOUNTER — Other Ambulatory Visit (HOSPITAL_COMMUNITY): Payer: Self-pay | Admitting: Family

## 2024-10-20 ENCOUNTER — Other Ambulatory Visit (HOSPITAL_COMMUNITY): Payer: Self-pay | Admitting: *Deleted

## 2024-10-20 MED ORDER — QUETIAPINE FUMARATE 50 MG PO TABS: 50.0000 mg | ORAL_TABLET | Freq: Every evening | ORAL | 0 refills | Status: DC | PRN

## 2024-10-21 ENCOUNTER — Other Ambulatory Visit: Payer: Self-pay

## 2024-10-21 ENCOUNTER — Other Ambulatory Visit: Payer: Self-pay | Admitting: Nurse Practitioner

## 2024-10-21 ENCOUNTER — Other Ambulatory Visit (HOSPITAL_COMMUNITY): Payer: Self-pay | Admitting: *Deleted

## 2024-10-21 ENCOUNTER — Telehealth: Payer: Self-pay | Admitting: Nurse Practitioner

## 2024-10-21 MED ORDER — QUETIAPINE FUMARATE 50 MG PO TABS: 50.0000 mg | ORAL_TABLET | Freq: Every evening | ORAL | 0 refills | Status: DC | PRN

## 2024-10-21 NOTE — Telephone Encounter (Signed)
 Placed the Pt's documents that need correction in the providers folder on 10/21/24

## 2024-10-22 ENCOUNTER — Other Ambulatory Visit: Payer: Self-pay

## 2024-10-22 MED ORDER — DEXLANSOPRAZOLE 30 MG PO CPDR
30.0000 mg | DELAYED_RELEASE_CAPSULE | Freq: Every day | ORAL | 1 refills | Status: AC
  Filled 2024-10-22: qty 90, 90d supply, fill #0

## 2024-10-22 NOTE — Telephone Encounter (Signed)
 Please advise [facility]

## 2024-10-22 NOTE — Telephone Encounter (Signed)
 Dexlansoprazole  30 MG capsule DR

## 2024-10-23 ENCOUNTER — Other Ambulatory Visit: Payer: Self-pay

## 2024-10-24 ENCOUNTER — Telehealth: Payer: Self-pay

## 2024-10-24 ENCOUNTER — Other Ambulatory Visit: Payer: Self-pay

## 2024-10-24 ENCOUNTER — Other Ambulatory Visit (HOSPITAL_COMMUNITY): Payer: Self-pay

## 2024-10-24 NOTE — Progress Notes (Unsigned)
 "  10/24/2024 Name: Jill Shaw MRN: [id number] DOB: 10/05/1976  No chief complaint on file.   Jill Shaw is a 49 y.o. year old female who presented for a telephone visit.   They were referred to the pharmacist by their PCP for assistance in managing diabetes. PMH includes HFpEF, HTN, COPD, IBS, gastroparesis, T2DM (hx of DKA), hypoglycemia unawareness, HLD   Subjective: Patient was  admitted at [facility] from 03/27/24 to 03/30/24 in the setting of seizure 2/2 hypoglycemia (BG of 34 mg/dL). This was a recurrent seizure, in the setting of hypoglycemia. She did have a R humerus fracture due to the seizure. She was discharged with reduced dose of insulin  (PTA was on Toujeo  U300 60 units daily and Humalog  U200 60 units TID before meals). Since then, patient's insulin  dose has been slowly titrated back up as patient has reported persistent and symptomatic hyperglycemia. Additionally, she has started Mounjaro  within the past several months. At pharmacy call on 06/27/24 patient was instructed to take Toujeo  U300 80 units once daily, Humalog  40 units TID with meals, Mounjaro  5 mg weekly, Jardiance  10 mg daily, and metformin  XR 1000 mg BID. In the meantime, we have tried to obtain a CGM for her. She was able to see Dr. Mercie (Endo) on 08/06/24. A1C was 7.4%. She was instructed to increase Mounjaro  to 7.5 mg weekly and at that time decrease Toujeo  to 70 units daily and decrease Humalog  to 30 units TID with meals. She was also instructed to increase Jardiance  to 25 mg daily and continue metformin  as prescribed. At pharmacy call on 08/05/24, we reiterated instructions given at Endo and encouraged her to pick up CGM. Staff at [facility] ensured she had the app downloaded on her phone.  Today, patient reports doing ok. States her BG are running higher this past week. She is wondering if she has an infection in her blood. She denies fever, cough, skin abnormalities, pain or burning with urination. She is having some nausea  but this is present at baseline for her. Insists she is barely eating anything, but is not willing to complete food recall from yesterday. States she did have a heavy dinner last night and regular breakfast this AM. She also reports she is having at least 3 cups of ginger ale per day. She reports initial CGM she tried to apply did not work. She is willing to try again.  \ Care Team: Primary Care Provider: Oley Bascom RAMAN, NP ; Next Scheduled Visit: Needs to be scheduled Endocrinologist Dr. Mercie; Next Scheduled Visit: 11/07/23  Medication Access/Adherence  Current Pharmacy:  [id number] - [place], [place] - [address] AT [facility] OF [address] & [address] [place] [address] Phone: [phone] Fax: [phone]  [facility] [address], [address] [place] [address] Phone: [phone] Fax: [phone]   Patient reports affordability concerns with their medications: No  Patient reports access/transportation concerns to their pharmacy: No  Patient reports adherence concerns with their medications:  Yes  - hx of self adjusting insulin   Diabetes:  Current medications: Toujeo  U300 Max Solostar (insulin  glargine) 70 units daily (still taking 80 units - in the evening), Humalog  (insulin  lispro) 30 units three times daily before meals (reports she is still taking 30-40 units TID), Jardiance  25 mg daily, metformin  XR 1000 mg BID, Mounjaro  10 mg weekly (Tuesdays)  Medications tried in the past: Victoza (nausea/vomiting) - dx of gastroparesis -  however GI reviewed and patient had a normal gastric emptying study in 2022  Using Accu Chek meter; testing 2-3 times daily Used 1 FL3+ CGM. Had difficulty with accuracy. Willing to pick up refill in case her sensors were part of the refill.  Current glucose readings:  09/29/24: AM 247 mg/dL, PM 675 mg/dL, PM 613 mg/dL 87/83/74: AM 447 mg/dL, PM  613 mg/dL, PM 579 mg/dL 87/82/74: AM 772 mg/dL, PM 613 mg/dL, PM 653 mg/dL 87/81/74: AM 552 mg/dL, PM 623 mg/dL, PM 709 mg/dL 87/80/74: AM 623 mg/dL   Limited CGM data available - last connected on 09/08/24. States that when she double checked with her glucometer her sugars were not actually low (ex: sensor says 60 mg/dL - finger stick read 888 or 120 mg/dL). Had to use 4 sensors before she got this one to work, and it only lasted a few days.    Patient denies hypoglycemic s/sx including dizziness, shakiness, sweating - patient has hypoglycemia unawareness - had CBG of 50 > 52 in ED on 08/09/24. Patient reports hyperglycemic symptoms including polydipsia. Denies blurry vision, frequent urination.   Current meal patterns: She is eating 2-3 times per day. Reports appetite is reduced. Not willing to do food recall from yesterday. States she had a heavy dinner last night. States she is having at least 3 cups of ginger ale per day (regular soda).   Heart Failure with preserved EF (EF 60-65%):  Current medications:  ACEi/ARB/ARNI: losartan  25 mg daily  SGLT2i: Jardiance  10 mg daily Beta blocker: metoprolol  succinate 100 mg daily Mineralocorticoid Receptor Antagonist: spironolactone  25 mg daily Diuretic regimen: torsemide  60 mg daily (switched from furosemide  by nephrology) Potassium 20 mEq daily (last filled 03/12/24 for 30ds)  Current home weights: does not have scale - working with [facility] to refax orders  Objective:  BP Readings from Last 3 Encounters:  09/21/24 (!) 136/58  09/08/24 (!) 149/69  08/15/24 122/61    Lab Results  Component Value Date   HGBA1C 7.4 (A) 08/06/2024   HGBA1C 9.2 (H) 03/27/2024   HGBA1C 8.9 (A) 02/25/2024       Latest Ref Rng & Units 08/09/2024    3:54 AM 08/06/2024    1:56 PM 07/31/2024   10:39 PM  BMP  Glucose 70 - 99 mg/dL 49  768  43   BUN 6 - 20 mg/dL 14  15  9    Creatinine 0.44 - 1.00 mg/dL 8.36  8.73  8.84   BUN/Creat Ratio 6 - 22 (calc)  12     Sodium 135 - 145 mmol/L 138  141  140   Potassium 3.5 - 5.1 mmol/L 3.5  4.2  3.6   Chloride 98 - 111 mmol/L 106  109  104   CO2 22 - 32 mmol/L 22  22  26    Calcium  8.9 - 10.3 mg/dL 9.3  89.6  89.4     Lab Results  Component Value Date   CHOL 153 12/09/2021   HDL 54 12/09/2021   LDLCALC 74 12/09/2021   TRIG 144 12/09/2021   CHOLHDL 2.8 12/09/2021    Medications Reviewed Today   Medications were not reviewed in this encounter       Assessment/Plan:    Diabetes: - Currently controlled with most recent A1C of 7.4% below goal <8% given recurrent hypoglycemia with seizures. Could consider lower A1C goal if we are able to achieve it without hypoglycemia, however given recent ED visits with incidental hypoglycemia it appears patient continues to have  hypoglycemia unawareness. There was also hypoglycemia on her 3 days of CGM back in Nov, but patient states this was not accurate and denies any BG < 70 mg/dL on her glucometer. For the past week, reports BG ranging 200s-400s in the setting of increased soda intake. Advised her to STOP intake of all sugary beverages. Advised her to reduce insulin  doses to what was recommended at previous appointments. Encouraged reapplication of CGM. Worked with [facility] pharmacy to request refill.  - Last UACR 02/25/24: 146 mg/g. Patient is treated with SGLT2i - need to continue to monitor closely for development of GU infections given risk factors of female sex, obesity, and poor glycemic control.  - Reviewed long term cardiovascular and renal outcomes of uncontrolled blood sugar - Reviewed goal A1c, goal fasting, and goal 2 hour post prandial glucose - Reviewed hypoglycemia management plan and the rule of 15 - Reviewed dietary modifications including  utilizing the healthy plate method, limiting portion size of carbohydrate foods, increasing intake of protein and non-starchy vegetables. Counseled patient to stay hydrated with water throughout the day. - Recommend  to continue Jardiance  25 mg daily - Recommend to continue metformin  XR to 1000 mg BID. Continue to monitor eGFR to ensure it stays > 45 mL/min - Recommend to decrease Toujeo  U300 to 70 units once daily in the evening as instructed by Endo - Recommend to decrease Humalog  to 30 units TID with meals as instructed by Endo - Recommend to continue Mounjaro  to 10 mg weekly  - Recommend to drink water only and avoid sugar containing beverages - Recommend to eat a small meal that contains protein three time daily, regardless of blood sugar.  - Encouraged patient to pick up FL3+ from [facility] pharmacy ($0 copay). - Next A1C due 11/07/23   Patient verbalized understanding of treatment plan.   Follow Up Plan:  PCP needs to be scheduled Pharmacist telephone 10/24/24 Endo 11/06/24 - confirmed she could make this appt   Jill Shaw, PharmD [facility] [phone]   "

## 2024-10-24 NOTE — Progress Notes (Signed)
 Attempted to outreach patient for scheduled telephone call at 1PM. Patient states she needs to reschedule.   Addressed patient's concern that copays for brand medications were $4.90 at [facility] pharmacy. Discussed that she does have Medicare Extra Help, but may have to pay through these copays at the beginning of the year until insurance will pick up the full cost. Patient aware, and will let us  know if this is unaffordable for her at any point.   Reminded patient of Endo appt on 11/06/24. Rescheduled pharmacist telephone follow-up for 11/14/24. Patient denied having immediate concerns with her blood sugars at this time. She has still not reapplied CGM at this time.   Lorain Baseman, PharmD [facility] [phone]

## 2024-10-27 ENCOUNTER — Other Ambulatory Visit: Payer: Self-pay

## 2024-10-28 ENCOUNTER — Other Ambulatory Visit: Payer: Self-pay

## 2024-10-28 ENCOUNTER — Encounter: Admitting: Physical Medicine and Rehabilitation

## 2024-10-29 ENCOUNTER — Other Ambulatory Visit: Payer: Self-pay

## 2024-10-30 ENCOUNTER — Ambulatory Visit: Payer: Self-pay

## 2024-10-30 ENCOUNTER — Other Ambulatory Visit: Payer: Self-pay

## 2024-10-30 NOTE — Telephone Encounter (Signed)
 Pt was squeezed in on the schedule. KH

## 2024-10-30 NOTE — Telephone Encounter (Signed)
 FYI Only or Action Required?: Action required by provider: request for appointment and Requesting cough medicine, denied OV.  Patient was last seen in primary care on 09/08/2024 by Oley Bascom RAMAN, NP.  Called Nurse Triage reporting Cough.  Symptoms began several days ago.  Interventions attempted: OTC medications: Mucinex  and Prescription medications: Inhalers and nebulizer.  Symptoms are: stable.  Triage Disposition: Home Care or see PCP  Patient/caregiver understands and will follow disposition?: No  Reason for Disposition  Cough  Answer Assessment - Initial Assessment Questions Pt using inhalers and neb and mucinex . Offered patient an OV, patient declined and is requesting a cough medicine her insurance will cover be sent to [facility] on file. Call back: [phone]  1. ONSET: When did the cough begin?      4 days ago  2. SPUTUM: Describe the color of your sputum (e.g., none, dry cough; clear, white, yellow, green)     Chest congestion, not able to cough it up  3. DIFFICULTY BREATHING: Are you having difficulty breathing? If Yes, ask: How bad is it? (e.g., mild, moderate, severe)      Denies  4. FEVER: Do you have a fever? If Yes, ask: What is your temperature, how was it measured, and when did it start?     Unsure, does not think so  5. CARDIAC HISTORY: Do you have any history of heart disease? (e.g., heart attack, congestive heart failure)      CHF  6. LUNG HISTORY: Do you have any history of lung disease?  (e.g., pulmonary embolus, asthma, emphysema)     COPD  7. OTHER SYMPTOMS: Do you have any other symptoms? (e.g., runny nose, wheezing, chest pain)       Weak / fatigue, chest pain present with coughing, intermittent chills, dull aching pain in abd all over, diarrhea x2 yesterday. Not much of an appetite, drinking mostly water, cranberry juice, gatorade.  Protocols used: Cough - Acute Non-Productive-A-AH  Copied from CRM #[id number]. Topic:  Clinical - Red Word Triage >> Oct 30, 2024  1:28 PM Willma SAUNDERS wrote: Red Word that prompted transfer to Nurse Triage: Patient states she has a lot of chest congestion and has been feeling very weak since Sunday.

## 2024-10-31 ENCOUNTER — Other Ambulatory Visit (HOSPITAL_COMMUNITY): Payer: Self-pay

## 2024-10-31 ENCOUNTER — Ambulatory Visit: Payer: Self-pay | Admitting: Nurse Practitioner

## 2024-10-31 ENCOUNTER — Other Ambulatory Visit: Payer: Self-pay

## 2024-10-31 ENCOUNTER — Telehealth: Payer: Self-pay

## 2024-10-31 MED ORDER — PREDNISONE 5 MG (21) PO TBPK
ORAL_TABLET | ORAL | 0 refills | Status: AC
  Filled 2024-10-31 (×2): qty 21, 6d supply, fill #0

## 2024-10-31 MED ORDER — AZITHROMYCIN 250 MG PO TABS
ORAL_TABLET | ORAL | 0 refills | Status: DC
  Filled 2024-10-31 (×2): qty 6, 5d supply, fill #0

## 2024-10-31 MED ORDER — GUAIFENESIN ER 1200 MG PO TB12
1200.0000 mg | ORAL_TABLET | Freq: Two times a day (BID) | ORAL | 0 refills | Status: AC
  Filled 2024-10-31 – 2024-11-12 (×2): qty 14, 7d supply, fill #0

## 2024-10-31 NOTE — Telephone Encounter (Signed)
 Pt advised she was sick and had a cough. Pt was requesting abx and cough med. Pt was made an video visit in error and was contacted to advise we would need her to come in. No answer lvm. KH

## 2024-11-01 ENCOUNTER — Other Ambulatory Visit (HOSPITAL_COMMUNITY): Payer: Self-pay

## 2024-11-01 ENCOUNTER — Other Ambulatory Visit: Payer: Self-pay

## 2024-11-01 DIAGNOSIS — J111 Influenza due to unidentified influenza virus with other respiratory manifestations: Secondary | ICD-10-CM | POA: Insufficient documentation

## 2024-11-01 DIAGNOSIS — M791 Myalgia, unspecified site: Secondary | ICD-10-CM | POA: Diagnosis present

## 2024-11-01 LAB — COMPREHENSIVE METABOLIC PANEL WITH GFR
ALT: <5 U/L (ref 0–44)
AST: 15 U/L (ref 15–41)
Albumin: 4.8 g/dL (ref 3.5–5.0)
Alkaline Phosphatase: 155 U/L — ABNORMAL HIGH (ref 38–126)
Anion gap: 16 — ABNORMAL HIGH (ref 5–15)
BUN: 11 mg/dL (ref 6–20)
CO2: 24 mmol/L (ref 22–32)
Calcium: 10.6 mg/dL — ABNORMAL HIGH (ref 8.9–10.3)
Chloride: 101 mmol/L (ref 98–111)
Creatinine, Ser: 1.31 mg/dL — ABNORMAL HIGH (ref 0.44–1.00)
GFR, Estimated: 50 mL/min — ABNORMAL LOW
Glucose, Bld: 244 mg/dL — ABNORMAL HIGH (ref 70–99)
Potassium: 3.8 mmol/L (ref 3.5–5.1)
Sodium: 141 mmol/L (ref 135–145)
Total Bilirubin: 0.3 mg/dL (ref 0.0–1.2)
Total Protein: 7.9 g/dL (ref 6.5–8.1)

## 2024-11-01 LAB — CBC
HCT: 44.2 % (ref 36.0–46.0)
Hemoglobin: 15.1 g/dL — ABNORMAL HIGH (ref 12.0–15.0)
MCH: 26.9 pg (ref 26.0–34.0)
MCHC: 34.2 g/dL (ref 30.0–36.0)
MCV: 78.8 fL — ABNORMAL LOW (ref 80.0–100.0)
Platelets: 227 K/uL (ref 150–400)
RBC: 5.61 MIL/uL — ABNORMAL HIGH (ref 3.87–5.11)
RDW: 15.2 % (ref 11.5–15.5)
WBC: 14.9 K/uL — ABNORMAL HIGH (ref 4.0–10.5)
nRBC: 0 % (ref 0.0–0.2)

## 2024-11-01 LAB — LIPASE, BLOOD: Lipase: 32 U/L (ref 11–51)

## 2024-11-01 NOTE — ED Triage Notes (Addendum)
 Flu diagnosed yesterday. Started on antibiotics. General aches-hurts all over. Shingles since October. Hx fibromyalgia, arthritis, and gastritis.

## 2024-11-02 ENCOUNTER — Emergency Department (HOSPITAL_BASED_OUTPATIENT_CLINIC_OR_DEPARTMENT_OTHER): Admission: EM | Admit: 2024-11-02 | Discharge: 2024-11-02 | Disposition: A

## 2024-11-02 ENCOUNTER — Emergency Department (HOSPITAL_BASED_OUTPATIENT_CLINIC_OR_DEPARTMENT_OTHER)

## 2024-11-02 DIAGNOSIS — J111 Influenza due to unidentified influenza virus with other respiratory manifestations: Secondary | ICD-10-CM

## 2024-11-02 DIAGNOSIS — R52 Pain, unspecified: Secondary | ICD-10-CM

## 2024-11-02 LAB — URINALYSIS, ROUTINE W REFLEX MICROSCOPIC
Bilirubin Urine: NEGATIVE
Glucose, UA: NEGATIVE mg/dL
Hgb urine dipstick: NEGATIVE
Ketones, ur: NEGATIVE mg/dL
Leukocytes,Ua: NEGATIVE
Nitrite: NEGATIVE
Protein, ur: NEGATIVE mg/dL
Specific Gravity, Urine: 1.01 (ref 1.005–1.030)
pH: 5.5 (ref 5.0–8.0)

## 2024-11-02 LAB — PREGNANCY, URINE: Preg Test, Ur: NEGATIVE

## 2024-11-02 MED ORDER — OXYCODONE HCL 5 MG PO TABS
5.0000 mg | ORAL_TABLET | Freq: Once | ORAL | Status: AC
  Administered 2024-11-02: 5 mg via ORAL
  Filled 2024-11-02: qty 1

## 2024-11-02 MED ORDER — SODIUM CHLORIDE 0.9 % IV BOLUS
500.0000 mL | Freq: Once | INTRAVENOUS | Status: AC
  Administered 2024-11-02: 500 mL via INTRAVENOUS

## 2024-11-02 NOTE — ED Provider Notes (Signed)
 " [place] [facility] AT [facility] Provider Note   CSN: [id number] Arrival date & time: 11/01/24  2228     Patient presents with: Generalized Body Aches   Jill Shaw is a 49 y.o. female.   HPI   Patient presents because of generalized body pain.  Patient states that for the past week she has been feeling bad.  Body aches.  Patient is been having productive cough.  Chills.  No documented fevers.  No chest pain.  Endorses chronic shortness of breath.  May have slightly worse than baseline.  No pleuritic chest pain hemoptysis.  No exertional chest pain.  No vomiting but does endorse nausea.  Tolerating p.o.  Patient states that she was told that she had a flu yesterday.  Symptoms started last week.  Also endorsing chronic pain from her shingles on her leg.  Previous medical history reviewed : Last seen in the ED in December 2025.  Leg pain.  Negative workup at that time.     Prior to Admission medications  Medication Sig Start Date End Date Taking? Authorizing Provider  Accu-Chek Softclix Lancets lancets Use as instructed to monitor blood sugar 4 times daily 06/23/24   Nichols, Tonya S, NP  albuterol  (PROVENTIL ) (2.5 MG/3ML) 0.083% nebulizer solution Take 3 mLs (2.5 mg total) by nebulization every 6 (six) hours as needed for wheezing or shortness of breath. 01/31/24 01/30/25  Parrett, Madelin RAMAN, NP  albuterol  (VENTOLIN  HFA) 108 (90 Base) MCG/ACT inhaler Inhale 2 puffs into the lungs every 6 (six) hours as needed for wheezing or shortness of breath. 11/26/23   Oley Bascom RAMAN, NP  azithromycin  (ZITHROMAX  Z-PAK) 250 MG tablet use as directed per package directions 10/31/24     benzonatate  (TESSALON ) 100 MG capsule Take 1 capsule (100 mg total) by mouth 3 (three) times daily as needed for cough. 09/19/24   Oley Bascom RAMAN, NP  Blood Glucose Monitoring Suppl (ACCU-CHEK GUIDE) w/Device KIT Use as directed to monitor blood sugar 4 times daily (before each meal and before bed).  06/23/24   Oley Bascom RAMAN, NP  Blood Pressure Monitoring KIT 1 each by Does not apply route 3 (three) times a week. 09/26/24   Nichols, Tonya S, NP  budesonide -formoterol  (SYMBICORT ) 160-4.5 MCG/ACT inhaler Inhale 2 puffs into the lungs 2 (two) times daily. 11/26/23   Oley Bascom RAMAN, NP  busPIRone  (BUSPAR ) 10 MG tablet Take 1 tablet (10 mg total) by mouth 2 (two) times daily. 03/03/24   Oley Bascom RAMAN, NP  Continuous Glucose Sensor (FREESTYLE LIBRE 3 PLUS SENSOR) MISC Change sensor every 15 days. Patient not taking: Reported on 09/08/2024 03/31/24   Oley Bascom RAMAN, NP  Continuous Glucose Sensor (FREESTYLE LIBRE 3 PLUS SENSOR) MISC Change every 15 days. Patient not taking: Reported on 09/08/2024 08/06/24   Thapa, Sudan, MD  Dexlansoprazole  30 MG capsule DR Take 1 capsule (30 mg total) by mouth daily. NEEDS OFFICE VISIT FOR ADDITIONAL REFILLS 10/22/24   Oley Bascom RAMAN, NP  diclofenac  Sodium (VOLTAREN ) 1 % GEL Apply 2 to 4 gram to painful sites up to 4 times daily if needed, max daily dose: 32 Gram 02/19/24     diclofenac  Sodium (VOLTAREN ) 1 % GEL apply 2 to 4 gram to painful sites up to 4 times daily if needed, max daily dose: 32 Gram 04/21/24     diclofenac  Sodium (VOLTAREN ) 1 % GEL Apply 2-4 grams topically up to 4 (four) times daily as needed. max 32 gm daily 08/25/24  diclofenac  Sodium (VOLTAREN ) 1 % GEL Apply 2-4 g topically 4 (four) times daily as needed. 09/25/24     dicyclomine  (BENTYL ) 20 MG tablet Take 1 tablet (20 mg total) by mouth 2 (two) times daily. 11/26/23   Oley Bascom RAMAN, NP  empagliflozin  (JARDIANCE ) 25 MG TABS tablet Take 1 tablet (25 mg total) by mouth daily before breakfast. 08/06/24   Thapa, Sudan, MD  ferrous sulfate  325 (65 FE) MG tablet Take 1 tablet (325 mg total) by mouth daily with breakfast. 03/30/24   Franchot Novel, MD  gabapentin  (NEURONTIN ) 100 MG capsule Take 1 capsule (100 mg total) by mouth 3 (three) times daily. 09/21/24   Nivia Colon, PA-C  glucose blood  (ACCU-CHEK GUIDE TEST) test strip Use as instructed to monitor blood sugar 4 times daily 06/23/24   Nichols, Tonya S, NP  Guaifenesin  (MUCINEX  MAXIMUM STRENGTH) 1200 MG TB12 Take 1 tablet (1,200 mg total) by mouth 2 (two) times daily for 7 days 10/31/24     guaiFENesin  (MUCINEX ) 600 MG 12 hr tablet Take 1 tablet (600 mg total) by mouth 2 (two) times daily. 09/18/24 09/18/25  Oley Bascom RAMAN, NP  GVOKE HYPOPEN  2-PACK 1 MG/0.2ML SOAJ Inject 1 mg into the skin as needed (severe hypoglycemia with not able to take oral medication or severe alterned mental status / unconcious.). 08/06/24   Thapa, Sudan, MD  hydrALAZINE  (APRESOLINE ) 25 MG tablet Take 1 tablet (25 mg total) by mouth 3 (three) times daily. 10/17/24   Oley Bascom RAMAN, NP  insulin  glargine, 2 Unit Dial , (TOUJEO  MAX SOLOSTAR) 300 UNIT/ML Solostar Pen Inject 70 Units into the skin daily. May increase up to 100 units daily if instructed by your provider. 08/06/24   Thapa, Sudan, MD  insulin  lispro (HUMALOG  KWIKPEN) 100 UNIT/ML KwikPen Inject 30 Units into the skin with breakfast, with lunch, and with evening meal. May increase up to 40 units with each meal if instructed by your provider. 08/06/24   Thapa, Sudan, MD  Insulin  Pen Needle (PEN NEEDLES) 31G X 5 MM MISC Use as instructed to inject insulin  four times day 06/12/24   Nichols, Tonya S, NP  levocetirizine (XYZAL) 5 MG tablet Take 5 mg by mouth every evening.    [provider]  losartan  (COZAAR ) 25 MG tablet 1 tab by mouth daily 06/12/24     Melatonin 10 MG TABS Take 10 mg by mouth at bedtime as needed (insomnia). Patient not taking: Reported on 09/08/2024 02/07/24   Patsy Lenis, MD  metFORMIN  (GLUCOPHAGE -XR) 500 MG 24 hr tablet Take 2 tablets (1,000 mg total) by mouth 2 (two) times daily with a meal. 04/29/24   [name], [name], FNP  metoprolol  succinate (TOPROL -XL) 100 MG 24 hr tablet Take 1 tablet (100 mg total) by mouth daily. Take with or immediately following a meal. 10/17/24    Oley Bascom RAMAN, NP  Misc. Devices (BMI DIGITAL SMART SCALE) MISC 1 each by Does not apply route daily. 09/25/24   Oley Bascom RAMAN, NP  naloxone  (NARCAN ) nasal spray 4 mg/0.1 mL Insert 1 spray into one nostril if poorly responding / turning blue. CALL 911 ASAP 10/13/23     nitroGLYCERIN  (NITROSTAT ) 0.4 MG SL tablet Place 1 tablet (0.4 mg total) under the tongue every 5 (five) minutes as needed for chest pain. Additional refills to be filled by PCP, patient aware 07/14/24   Oley Bascom RAMAN, NP  norethindrone  (AYGESTIN ) 5 MG tablet Take 1 tablet (5 mg total) by mouth 2 (two) times daily  as needed (bleeding). 06/09/24   Ajewole, Christana, MD  ondansetron  (ZOFRAN ) 4 MG tablet Take 1 tablet (4 mg total) by mouth every 6 (six) hours. 09/21/24   Nivia Colon, PA-C  ondansetron  (ZOFRAN -ODT) 4 MG disintegrating tablet Take 1 tablet (4 mg total) by mouth every 8 (eight) hours as needed for nausea or vomiting. 05/27/24   Oley Bascom RAMAN, NP  Oxycodone  HCl 10 MG TABS Take 1 tablet (10 mg total) by mouth 4 (four) times daily as needed for pain 04/21/24     Oxycodone  HCl 10 MG TABS Take 1 tablet (10 mg total) by mouth 4 (four) times daily if needed for pain 05/21/24     Oxycodone  HCl 10 MG TABS Take 1 tablet (10 mg total) by mouth 4 (four) times daily as needed for pain 06/20/24 06/19/24     Oxycodone  HCl 10 MG TABS Take 1 tablet (10 mg total) by mouth 4 (four) times daily as needed for pain 08/20/24     Oxycodone  HCl 10 MG TABS Take 1 tablet (10 mg total) by mouth 5 (five) times daily as needed for pain 09/25/24     potassium chloride  SA (KLOR-CON  M) 20 MEQ tablet Take 1 tablet (20 mEq total) by mouth daily. 03/11/24   Oley Bascom RAMAN, NP  predniSONE  (STERAPRED UNI-PAK 21 TAB) 5 MG (21) TBPK tablet use as directed per package directions 10/31/24     pregabalin  (LYRICA ) 150 MG capsule Take 1 capsule (150 mg total) by mouth 3 (three) times daily. 06/10/24   Raulkar, Sven SQUIBB, MD  QUEtiapine  (SEROQUEL ) 50 MG tablet Take 1  tablet (50 mg total) by mouth at bedtime and may repeat dose one time if needed. 10/21/24 10/21/25  Ezzard Staci SAILOR, NP  rosuvastatin  (CRESTOR ) 20 MG tablet Take 1 tablet (20 mg total) by mouth daily. KEEP OV. 04/25/24   Patwardhan, Newman PARAS, MD  senna (SENOKOT) 8.6 MG TABS tablet Take 1 tablet (8.6 mg total) by mouth daily. 03/30/24   Franchot Novel, MD  spironolactone  (ALDACTONE ) 25 MG tablet Take 1 tablet (25 mg total) by mouth daily. 10/17/24   Oley Bascom RAMAN, NP  sucralfate  (CARAFATE ) 1 g tablet Take 1 tablet (1 g total) by mouth 4 (four) times daily -  with meals and at bedtime. 03/11/24   Zehr, Jessica D, PA-C  tirzepatide  (MOUNJARO ) 10 MG/0.5ML Pen Inject 10 mg into the skin once a week. 09/17/24   Thapa, Sudan, MD  tizanidine  (ZANAFLEX ) 6 MG capsule Take 1 capsule (6 mg total) by mouth 3 (three) times daily. 08/04/24   Raulkar, Sven SQUIBB, MD  torsemide  (DEMADEX ) 20 MG tablet Take 3 tablets (60 mg total) by mouth in the morning for swelling. 06/12/24       Allergies: Elavil  [amitriptyline ], Orudis [ketoprofen], Desyrel  [trazodone ], Tylenol  [acetaminophen ], Ambien  [zolpidem  tartrate], Aspirin , Hydroxyzine  hcl, Motrin  [ibuprofen ], Naprosyn [naproxen], Neurontin  [gabapentin ], Sulfa antibiotics, Ultram  [tramadol ], Victoza [liraglutide], and Zegerid [omeprazole -sodium bicarbonate ]    Review of Systems  Constitutional:  Negative for chills and fever.  HENT:  Negative for ear pain and sore throat.   Eyes:  Negative for pain and visual disturbance.  Respiratory:  Negative for cough and shortness of breath.   Cardiovascular:  Negative for chest pain and palpitations.  Gastrointestinal:  Negative for abdominal pain and vomiting.  Genitourinary:  Negative for dysuria and hematuria.  Musculoskeletal:  Negative for arthralgias and back pain.  Skin:  Negative for color change and rash.  Neurological:  Negative for seizures and syncope.  All other systems reviewed and are negative.   Updated Vital  Signs BP (!) 169/95   Pulse 97   Temp 97.9 F (36.6 C)   Resp 20   SpO2 99%   Physical Exam Vitals and nursing note reviewed.  Constitutional:      General: She is not in acute distress.    Appearance: She is well-developed.  HENT:     Head: Normocephalic and atraumatic.  Eyes:     Conjunctiva/sclera: Conjunctivae normal.  Cardiovascular:     Rate and Rhythm: Normal rate and regular rhythm.     Heart sounds: No murmur heard. Pulmonary:     Effort: Pulmonary effort is normal. No respiratory distress.     Breath sounds: Normal breath sounds.  Abdominal:     Palpations: Abdomen is soft.     Tenderness: There is no abdominal tenderness.  Musculoskeletal:        General: No swelling.     Cervical back: Neck supple.  Skin:    General: Skin is warm and dry.     Capillary Refill: Capillary refill takes less than 2 seconds.  Neurological:     Mental Status: She is alert.  Psychiatric:        Mood and Affect: Mood normal.     (all labs ordered are listed, but only abnormal results are displayed) Labs Reviewed  COMPREHENSIVE METABOLIC PANEL WITH GFR - Abnormal; Notable for the following components:      Result Value   Glucose, Bld 244 (*)    Creatinine, Ser 1.31 (*)    Calcium  10.6 (*)    Alkaline Phosphatase 155 (*)    GFR, Estimated 50 (*)    Anion gap 16 (*)    All other components within normal limits  CBC - Abnormal; Notable for the following components:   WBC 14.9 (*)    RBC 5.61 (*)    Hemoglobin 15.1 (*)    MCV 78.8 (*)    All other components within normal limits  LIPASE, BLOOD  URINALYSIS, ROUTINE W REFLEX MICROSCOPIC  PREGNANCY, URINE    EKG: EKG Interpretation Date/Time:  Sunday November 02 2024 02:59:48 EST Ventricular Rate:  100 PR Interval:  151 QRS Duration:  85 QT Interval:  338 QTC Calculation: 436 R Axis:   81  Text Interpretation: Sinus tachycardia Low voltage, precordial leads Anteroseptal infarct, old Confirmed by Simon Rea [phone])  on 11/02/2024 3:48:33 AM  Radiology: DG Chest Portable 1 View Result Date: 11/02/2024 EXAM: 1 VIEW(S) XRAY OF THE CHEST 11/02/2024 02:59:49 AM COMPARISON: 02/04/2024 CLINICAL HISTORY: Shortness of breath. FINDINGS: LUNGS AND PLEURA: Linear scarring in right mid lung. No pleural effusion. No pneumothorax. HEART AND MEDIASTINUM: No acute abnormality of the cardiac and mediastinal silhouettes. BONES AND SOFT TISSUES: No acute osseous abnormality. IMPRESSION: 1. No acute findings. Electronically signed by: Oneil Devonshire MD 11/02/2024 03:19 AM EST RP Workstation: [id number]     Procedures   Medications Ordered in the ED  sodium chloride  0.9 % bolus 500 mL (0 mLs Intravenous Stopped 11/02/24 0418)  oxyCODONE  (Oxy IR/ROXICODONE ) immediate release tablet 5 mg (5 mg Oral Given 11/02/24 0251)  oxyCODONE  (Oxy IR/ROXICODONE ) immediate release tablet 5 mg (5 mg Oral Given 11/02/24 0423)                                    Medical Decision Making Amount and/or Complexity of Data Reviewed Labs: ordered. Radiology:  ordered.  Risk Prescription drug management.     HPI:  HPI   Patient presents because of generalized body pain.  Patient states that for the past week she has been feeling bad.  Body aches.  Patient is been having productive cough.  Chills.  No documented fevers.  No chest pain.  Endorses chronic shortness of breath.  May have slightly worse than baseline.  No pleuritic chest pain hemoptysis.  No exertional chest pain.  No vomiting but does endorse nausea.  Tolerating p.o.  Patient states that she was told that she had a flu yesterday.  Symptoms started last week.  Also endorsing chronic pain from her shingles on her leg.  Previous medical history reviewed : Last seen in the ED in December 2025.  Leg pain.  Negative workup at that time.  MDM:   Upon examination, patient hemodynamically stable. A&O x 3 with GCS 15.  Lung sounds clear to auscultation bilaterally  Labs obtained out of  triage.  Small leukocytosis.  Likely in setting of infection.  Known flu diagnosis.  Creatinine at baseline around 1.3.  Small elevation of anion gap.  Likely in the setting of slight dehydration from lack of p.o. intake over the past couple of days.  Lipase normal  Will obtain chest x-ray.  Small concern for any kind of superimposed pneumonia.  Obtain EKG as well given the slight tachycardia when she first arrived.  Think this is likely multifactorial.  No concerns for ACS.  No concerns for PE.  Chronic pain.  Diffuse in nature.  Will give dose oxycodone  here and reassess.  500 cc of fluid in setting of dehydration   Reevaluation:   Upon reexamination, patient hemodynamically stable.  Remains A&O x 3 with GCS 15.  Patient tolerating p.o.  No acute distress.  Hemodynamically stable.  No tachycardia.  No tachypnea at this time.  O2 saturation normal.  Chest x-ray shows no infiltrate.  No concerns for bacterial pneumonia this point time   I think she likely has the flu that is exacerbating her fibromyalgia history.  Explained that she will need to follow-up with her pain doctor and/or PCP for opioid prescription  No further testing needed at this time.  Interventions: 500 cc fluid   EKG Interpreted by Me: sinus    Cardiac Tele Interpreted by Me: sinus    I have independently interpreted the CXR   images and agree with the radiologist finding   Social Determinant of Health: chronic pain    Disposition and Follow Up: pcp      Final diagnoses:  Flu  Body aches    ED Discharge Orders     None          Simon Lavonia SAILOR, MD 11/02/24 [phone]  "

## 2024-11-02 NOTE — Discharge Instructions (Signed)
 Stay well-hydrated.  You have any, worsening chest pain or shortness of breath please come to ED for further evaluation.  If you not able to tolerate oral food or water please, to ED  Follow-up with PCP.

## 2024-11-03 ENCOUNTER — Telehealth (HOSPITAL_COMMUNITY): Admitting: Family

## 2024-11-03 ENCOUNTER — Other Ambulatory Visit (HOSPITAL_COMMUNITY): Payer: Self-pay

## 2024-11-03 DIAGNOSIS — G479 Sleep disorder, unspecified: Secondary | ICD-10-CM | POA: Diagnosis not present

## 2024-11-03 DIAGNOSIS — F321 Major depressive disorder, single episode, moderate: Secondary | ICD-10-CM | POA: Diagnosis not present

## 2024-11-03 MED ORDER — QUETIAPINE FUMARATE 50 MG PO TABS: 50.0000 mg | ORAL_TABLET | Freq: Every day | ORAL | 1 refills | Status: AC

## 2024-11-03 MED ORDER — QUETIAPINE FUMARATE 50 MG PO TABS: 50.0000 mg | ORAL_TABLET | Freq: Every evening | ORAL | 1 refills | Status: DC | PRN

## 2024-11-03 NOTE — Progress Notes (Signed)
 Virtual Visit via Video Note  I connected with Jill Shaw on 11/03/24 at  3:00 PM EST by a video enabled telemedicine application and verified that I am speaking with the correct person using two identifiers.  Location: Patient: Home Provider: Office   I discussed the limitations of evaluation and management by telemedicine and the availability of in person appointments. The patient expressed understanding and agreed to proceed.   I discussed the assessment and treatment plan with the patient. The patient was provided an opportunity to ask questions and all were answered. The patient agreed with the plan and demonstrated an understanding of the instructions.   The patient was advised to call back or seek an in-person evaluation if the symptoms worsen or if the condition fails to improve as anticipated.  I provided 15 minutes of non-face-to-face time during this encounter.   Staci LOISE Kerns, NP   BH MD/PA/NP OP Progress Note  11/03/2024 2:48 PM DEMISHA NOKES  MRN:  [id number]  Chief Complaint: Medication management follow-up appointment  HPI:  Jill Shaw 49 year old African-American female presents for medication management follow-up appointment.  She was seen and evaluated.  Virtual platform care agility.  Carries a diagnosis related to major depressive disorder, generalized anxiety disorder and sleep disturbance. Patient has multitude of physical health elements that attributes to her depressive symptoms.  Reports presenting for medication refill with Seroquel .  States medication is helping with sleep disturbance.  She reports she is recently recovering from the flu and continues to have lasting effects related to decreased motivation and decreased energy.  No concerns related to suicidal or homicidal ideations.  Denies auditory or visual hallucinations.  Per previous assessment patient was encouraged to follow-up with sleep study due to reports of disrupted sleep denied that she  has followed up at this time. Latana is currently prescribed Seroquel  50 mg x 1 repeat for sleep disturbance will make 30-day supply available as previously reported medication was not helping consideration for discontinuing it and following up with sleep study.  She appeared amendable to plan.  Support and encouragement reassurance was provided.   Visit Diagnosis:    ICD-10-CM   1. Current moderate episode of major depressive disorder without prior episode (HCC)  F32.1     2. Sleep disturbance  G47.9       Past Psychiatric History:   Past Medical History:  Past Medical History:  Diagnosis Date   Abnormal uterine bleeding (AUB)    Arthritis    knees, hands   Atypical chest pain    cardiology--- dr elmira did cardiac cath 08-30-2021 showed minimal luminal irregularity involving LAD all other coronaries w/ normal  flow   Chronic diastolic (congestive) heart failure [facility])    cardiologist---- dr elmira;  preserved ef   Chronic iron  deficiency anemia    Chronic pain syndrome    followed by pain management--- dr lorilee   CKD (chronic kidney disease), stage III (HCC)    Diabetic gastroparesis (HCC)    Diabetic peripheral neuropathy (HCC)    Fibromyalgia    GAD (generalized anxiety disorder)    Generalized abdominal pain    GERD (gastroesophageal reflux disease)    History of acute renal failure 03/21/2023   admission in epic due to N/V/D due ot severe sepsis POA due to UTI   History of chronic gastritis    inflammatory   History of diabetic ketoacidosis    multiple admission's last 3 in epic 06/ 2022;  01/ 2022;   09/  2021   History of seizure 01/2016   hypoglycemic seizure   History of vertebral compression fracture 12/2020   T11 -- T12 & L1   Hyperlipidemia    Hypertension    followed by pcp   Insulin  dependent type 2 diabetes mellitus (HCC)    uncontrolled,  followed by pcp   Irritable bowel syndrome with constipation    MDD (major depressive disorder)     Moderate COPD (chronic obstructive pulmonary disease) (HCC)    pulmology--- dr brenna   Moderate persistent asthma    Sickle cell trait    Vitamin D  deficiency 10/2019   Wears glasses     Past Surgical History:  Procedure Laterality Date   CATARACT EXTRACTION W/ INTRAOCULAR LENS IMPLANT Left    CESAREAN SECTION  2001   for twins   DILATION AND CURETTAGE OF UTERUS N/A 08/20/2019   Procedure: DILATATION AND CURETTAGE;  Surgeon: Starla Harland BROCKS, MD;  Location: [facility];  Service: Gynecology;  Laterality: N/A;   DILATION AND CURETTAGE OF UTERUS  05/01/2023   Procedure: DILATATION AND CURETTAGE;  Surgeon: Jeralyn Crutch, MD;  Location: [facility];  Service: Gynecology;;   ENDOMETRIAL ABLATION N/A 08/20/2019   Procedure: Lela Ablation;  Surgeon: Starla Harland BROCKS, MD;  Location: [facility] OR;  Service: Gynecology;  Laterality: N/A;   EYE SURGERY Bilateral    laser right and cataract removed left eye   HYSTEROSCOPY N/A 05/01/2023   Procedure: HYSTEROSCOPY;  Surgeon: Jeralyn Crutch, MD;  Location: [facility];  Service: Gynecology;  Laterality: N/A;   INTRAUTERINE DEVICE (IUD) INSERTION N/A 05/01/2023   Procedure: INTRAUTERINE DEVICE (IUD) INSERTION;  Surgeon: Jeralyn Crutch, MD;  Location: [facility];  Service: Gynecology;  Laterality: N/A;   LEFT HEART CATH AND CORONARY ANGIOGRAPHY N/A 08/30/2021   Procedure: LEFT HEART CATH AND CORONARY ANGIOGRAPHY;  Surgeon: Elmira Newman PARAS, MD;  [facility]: [facility];  Service: Cardiovascular;  Laterality: N/A;   RADIOLOGY WITH ANESTHESIA N/A 09/16/2019   Procedure: MRI WITH ANESTHESIA   L SPINE WITHOUT CONTRAST, T SPINE WITHOUT CONTRAST , CERVICAL WITHOUT CONTRAST;  Surgeon: Radiologist, Medication, MD;  Location: [facility];  Service: Radiology;  Laterality: N/A;   TOOTH EXTRACTION N/A 01/14/2024   Procedure: DENTAL RESTORATION/EXTRACTIONS;  Surgeon: Sheryle Hamilton, DMD;  Location: [facility];  Service: Oral  Surgery;  Laterality: N/A;   TUBAL LIGATION     interval BTL   UPPER GI ENDOSCOPY  07/2017    Family Psychiatric History:   Family History:  Family History  Problem Relation Age of Onset   Diabetes Mother    Hypertension Mother    Colon cancer Maternal Grandfather    Migraines Paternal Grandfather    Colon cancer Maternal Aunt    Breast cancer Maternal Aunt    Esophageal cancer Neg Hx    Stomach cancer Neg Hx    Rectal cancer Neg Hx     Social History:  Social History   Socioeconomic History   Marital status: Divorced    Spouse name: Not on file   Number of children: 3   Years of education: Not on file   Highest education level: Not on file  Occupational History   Occupation: unemployed  Tobacco Use   Smoking status: Some Days    Current packs/day: 0.00    Average packs/day: 0.3 packs/day for 26.0 years (6.5 ttl pk-yrs)    Types: Cigarettes    Start date: 09/13/1994    Last attempt to  quit: 09/13/2020    Years since quitting: 4.1   Smokeless tobacco: Never   Tobacco comments:    Pt smokes 1/2 ppd. AB. CMA 09-10-23  Vaping Use   Vaping status: Never Used  Substance and Sexual Activity   Alcohol use: No   Drug use: No   Sexual activity: Not Currently    Birth control/protection: I.U.D.  Other Topics Concern   Not on file  Social History Narrative   Right Handed   Lives in a one story apartment, but lives on the second floor   Drinks caffeine  once in awhile   Social Drivers of Health   Tobacco Use: High Risk (09/21/2024)   Patient History    Smoking Tobacco Use: Some Days    Smokeless Tobacco Use: Never    Passive Exposure: Not on file  Financial Resource Strain: Low Risk (03/18/2023)   Overall Financial Resource Strain (CARDIA)    Difficulty of Paying Living Expenses: Not very hard  Food Insecurity: No Food Insecurity (03/27/2024)   Epic    Worried About Programme Researcher, Broadcasting/film/video in the Last Year: Never true    Ran Out of Food in the Last Year: Never true   Transportation Needs: No Transportation Needs (03/27/2024)   Epic    Lack of Transportation (Medical): No    Lack of Transportation (Non-Medical): No  Physical Activity: Inactive (03/18/2023)   Exercise Vital Sign    Days of Exercise per Week: 0 days    Minutes of Exercise per Session: 0 min  Stress: Stress Concern Present (03/18/2023)   [employer] of Occupational Health - Occupational Stress Questionnaire    Feeling of Stress : To some extent  Social Connections: Moderately Isolated (02/04/2024)   Social Connection and Isolation Panel    Frequency of Communication with Friends and Family: More than three times a week    Frequency of Social Gatherings with Friends and Family: Three times a week    Attends Religious Services: 1 to 4 times per year    Active Member of Clubs or Organizations: No    Attends Banker Meetings: Never    Marital Status: Separated  Depression (PHQ2-9): High Risk (08/15/2024)   Depression (PHQ2-9)    PHQ-2 Score: 21  Alcohol Screen: Low Risk (03/18/2023)   Alcohol Screen    Last Alcohol Screening Score (AUDIT): 0  Housing: Low Risk (03/27/2024)   Epic    Unable to Pay for Housing in the Last Year: No    Number of Times Moved in the Last Year: 0    Homeless in the Last Year: No  Utilities: Not At Risk (03/27/2024)   Epic    Threatened with loss of utilities: No  Health Literacy: Not on file    Allergies: Allergies[1]  Metabolic Disorder Labs: Lab Results  Component Value Date   HGBA1C 7.4 (A) 08/06/2024   MPG 217.34 03/27/2024   MPG 188.64 07/29/2023   No results found for: PROLACTIN Lab Results  Component Value Date   CHOL 153 12/09/2021   TRIG 144 12/09/2021   HDL 54 12/09/2021   CHOLHDL 2.8 12/09/2021   VLDL 29 03/09/2017   LDLCALC 74 12/09/2021   LDLCALC 48 07/29/2021   Lab Results  Component Value Date   TSH 1.813 07/30/2023   TSH 2.330 07/29/2021    Therapeutic Level Labs: No results found for: LITHIUM No  results found for: VALPROATE No results found for: CBMZ  Current Medications: Current Outpatient Medications  Medication Sig Dispense  Refill   Accu-Chek Softclix Lancets lancets Use as instructed to monitor blood sugar 4 times daily 300 each 4   albuterol  (PROVENTIL ) (2.5 MG/3ML) 0.083% nebulizer solution Take 3 mLs (2.5 mg total) by nebulization every 6 (six) hours as needed for wheezing or shortness of breath. 75 mL 2   albuterol  (VENTOLIN  HFA) 108 (90 Base) MCG/ACT inhaler Inhale 2 puffs into the lungs every 6 (six) hours as needed for wheezing or shortness of breath. 8.5 g 3   benzonatate  (TESSALON ) 100 MG capsule Take 1 capsule (100 mg total) by mouth 3 (three) times daily as needed for cough. 20 capsule 0   Blood Glucose Monitoring Suppl (ACCU-CHEK GUIDE) w/Device KIT Use as directed to monitor blood sugar 4 times daily (before each meal and before bed). 1 kit 0   Blood Pressure Monitoring KIT 1 each by Does not apply route 3 (three) times a week. 1 kit 0   budesonide -formoterol  (SYMBICORT ) 160-4.5 MCG/ACT inhaler Inhale 2 puffs into the lungs 2 (two) times daily. 10.2 g 5   busPIRone  (BUSPAR ) 10 MG tablet Take 1 tablet (10 mg total) by mouth 2 (two) times daily. 60 tablet 2   Continuous Glucose Sensor (FREESTYLE LIBRE 3 PLUS SENSOR) MISC Change sensor every 15 days. (Patient not taking: Reported on 09/08/2024) 2 each 11   Continuous Glucose Sensor (FREESTYLE LIBRE 3 PLUS SENSOR) MISC Change every 15 days. (Patient not taking: Reported on 09/08/2024) 6 each 3   Dexlansoprazole  30 MG capsule DR Take 1 capsule (30 mg total) by mouth daily. NEEDS OFFICE VISIT FOR ADDITIONAL REFILLS 90 capsule 1   diclofenac  Sodium (VOLTAREN ) 1 % GEL Apply 2 to 4 gram to painful sites up to 4 times daily if needed, max daily dose: 32 Gram 300 g 1   diclofenac  Sodium (VOLTAREN ) 1 % GEL apply 2 to 4 gram to painful sites up to 4 times daily if needed, max daily dose: 32 Gram 300 g 1   diclofenac  Sodium  (VOLTAREN ) 1 % GEL Apply 2-4 grams topically up to 4 (four) times daily as needed. max 32 gm daily 300 g 1   diclofenac  Sodium (VOLTAREN ) 1 % GEL Apply 2-4 g topically 4 (four) times daily as needed. 300 g 1   dicyclomine  (BENTYL ) 20 MG tablet Take 1 tablet (20 mg total) by mouth 2 (two) times daily. 60 tablet 3   empagliflozin  (JARDIANCE ) 25 MG TABS tablet Take 1 tablet (25 mg total) by mouth daily before breakfast. 90 tablet 3   ferrous sulfate  325 (65 FE) MG tablet Take 1 tablet (325 mg total) by mouth daily with breakfast. 30 tablet 0   gabapentin  (NEURONTIN ) 100 MG capsule Take 1 capsule (100 mg total) by mouth 3 (three) times daily. 60 capsule 0   glucose blood (ACCU-CHEK GUIDE TEST) test strip Use as instructed to monitor blood sugar 4 times daily 300 each 4   Guaifenesin  (MUCINEX  MAXIMUM STRENGTH) 1200 MG TB12 Take 1 tablet (1,200 mg total) by mouth 2 (two) times daily for 7 days 14 tablet 0   guaiFENesin  (MUCINEX ) 600 MG 12 hr tablet Take 1 tablet (600 mg total) by mouth 2 (two) times daily. 60 tablet 2   GVOKE HYPOPEN  2-PACK 1 MG/0.2ML SOAJ Inject 1 mg into the skin as needed (severe hypoglycemia with not able to take oral medication or severe alterned mental status / unconcious.). 0.4 mL 1   hydrALAZINE  (APRESOLINE ) 25 MG tablet Take 1 tablet (25 mg total) by mouth 3 (three)  times daily. 90 tablet 0   insulin  glargine, 2 Unit Dial , (TOUJEO  MAX SOLOSTAR) 300 UNIT/ML Solostar Pen Inject 70 Units into the skin daily. May increase up to 100 units daily if instructed by your provider. 18 mL 5   insulin  lispro (HUMALOG  KWIKPEN) 100 UNIT/ML KwikPen Inject 30 Units into the skin with breakfast, with lunch, and with evening meal. May increase up to 40 units with each meal if instructed by your provider. 36 mL 5   Insulin  Pen Needle (PEN NEEDLES) 31G X 5 MM MISC Use as instructed to inject insulin  four times day 200 each 3   levocetirizine (XYZAL) 5 MG tablet Take 5 mg by mouth every evening.      losartan  (COZAAR ) 25 MG tablet 1 tab by mouth daily 90 tablet 3   Melatonin 10 MG TABS Take 10 mg by mouth at bedtime as needed (insomnia). (Patient not taking: Reported on 09/08/2024) 30 tablet 0   metFORMIN  (GLUCOPHAGE -XR) 500 MG 24 hr tablet Take 2 tablets (1,000 mg total) by mouth 2 (two) times daily with a meal. 360 tablet 1   metoprolol  succinate (TOPROL -XL) 100 MG 24 hr tablet Take 1 tablet (100 mg total) by mouth daily. Take with or immediately following a meal. 90 tablet 0   Misc. Devices (BMI DIGITAL SMART SCALE) MISC 1 each by Does not apply route daily. 1 each 0   naloxone  (NARCAN ) nasal spray 4 mg/0.1 mL Insert 1 spray into one nostril if poorly responding / turning blue. CALL 911 ASAP 2 each 0   nitroGLYCERIN  (NITROSTAT ) 0.4 MG SL tablet Place 1 tablet (0.4 mg total) under the tongue every 5 (five) minutes as needed for chest pain. Additional refills to be filled by PCP, patient aware 25 tablet 0   norethindrone  (AYGESTIN ) 5 MG tablet Take 1 tablet (5 mg total) by mouth 2 (two) times daily as needed (bleeding). 90 tablet 8   ondansetron  (ZOFRAN ) 4 MG tablet Take 1 tablet (4 mg total) by mouth every 6 (six) hours. 12 tablet 0   ondansetron  (ZOFRAN -ODT) 4 MG disintegrating tablet Take 1 tablet (4 mg total) by mouth every 8 (eight) hours as needed for nausea or vomiting. 20 tablet 0   Oxycodone  HCl 10 MG TABS Take 1 tablet (10 mg total) by mouth 4 (four) times daily as needed for pain 120 tablet 0   Oxycodone  HCl 10 MG TABS Take 1 tablet (10 mg total) by mouth 4 (four) times daily if needed for pain 120 tablet 0   Oxycodone  HCl 10 MG TABS Take 1 tablet (10 mg total) by mouth 4 (four) times daily as needed for pain 06/20/24 12 tablet 0   Oxycodone  HCl 10 MG TABS Take 1 tablet (10 mg total) by mouth 4 (four) times daily as needed for pain 12 tablet 0   Oxycodone  HCl 10 MG TABS Take 1 tablet (10 mg total) by mouth 5 (five) times daily as needed for pain 150 tablet 0   potassium chloride  SA  (KLOR-CON  M) 20 MEQ tablet Take 1 tablet (20 mEq total) by mouth daily. 30 tablet 0   predniSONE  (STERAPRED UNI-PAK 21 TAB) 5 MG (21) TBPK tablet use as directed per package directions 21 tablet 0   pregabalin  (LYRICA ) 150 MG capsule Take 1 capsule (150 mg total) by mouth 3 (three) times daily. 270 capsule 3   QUEtiapine  (SEROQUEL ) 50 MG tablet Take 1 tablet (50 mg total) by mouth at bedtime and may repeat dose one time if  needed. 60 tablet 1   rosuvastatin  (CRESTOR ) 20 MG tablet Take 1 tablet (20 mg total) by mouth daily. KEEP OV. 90 tablet 0   senna (SENOKOT) 8.6 MG TABS tablet Take 1 tablet (8.6 mg total) by mouth daily. 120 tablet 0   spironolactone  (ALDACTONE ) 25 MG tablet Take 1 tablet (25 mg total) by mouth daily. 90 tablet 0   sucralfate  (CARAFATE ) 1 g tablet Take 1 tablet (1 g total) by mouth 4 (four) times daily -  with meals and at bedtime. 120 tablet 3   tirzepatide  (MOUNJARO ) 10 MG/0.5ML Pen Inject 10 mg into the skin once a week. 2 mL 11   tizanidine  (ZANAFLEX ) 6 MG capsule Take 1 capsule (6 mg total) by mouth 3 (three) times daily. 90 capsule 3   torsemide  (DEMADEX ) 20 MG tablet Take 3 tablets (60 mg total) by mouth in the morning for swelling. 270 tablet 3   No current facility-administered medications for this visit.     Musculoskeletal:  Psychiatric Specialty Exam: Review of Systems  There were no vitals taken for this visit.There is no height or weight on file to calculate BMI.  General Appearance: Casual  Eye Contact:  Good  Speech:  Clear and Coherent  Volume:  Normal  Mood:  Depressed  Affect:  Congruent  Thought Process:  Coherent  Orientation:  Full (Time, Place, and Person)  Thought Content: Logical   Suicidal Thoughts:  No  Homicidal Thoughts:  No  Memory:  Immediate;   Good Recent;   Good  Judgement:  Good  Insight:  Good  Psychomotor Activity:  Normal  Concentration:  Concentration: Good  Recall:  Good  Fund of Knowledge: Good  Language: Good   Akathisia:  NA  Handed:  Right  AIMS (if indicated): not done  Assets:  Communication Skills Desire for Improvement  ADL's:  Intact  Cognition: WNL  Sleep:  Fair   Screenings: GAD-7    Flowsheet Row Office Visit from 08/15/2024 in [facility] Office Visit from [facility]/03/2023 [facility] Office Visit from 09/05/2022 in [facility] Office Visit from 07/25/2021 in [facility] Clinical Support from 04/15/2021 in [facility]  Total GAD-7 Score 10 10 0 12 4   PHQ2-9    Flowsheet Row Office Visit from 08/15/2024 in [facility] Office Visit from 07/29/2024 in [facility] Office Visit from 02/14/2024 in [facility] Office Visit from 12/21/2023 in [facility] Of [facility] Office Visit from 07/26/2023 in [facility] Rehabilitation  PHQ-2 Total Score 6 4 0 0 0  PHQ-9 Total Score 21 13 -- -- --   Flowsheet Row ED from 09/21/2024 in [facility] at [facility] ED from 08/09/2024 in [facility] Emergency Department at [facility] ED from 07/31/2024 in [facility] Emergency Department at [facility]  C-SSRS RISK CATEGORY No Risk No Risk No Risk     Assessment and Plan: Jill Shaw 49 year old African-American female presents for medication management follow-up appointment.  Previously, reported Seroquel  was not helpful with sleep disturbance symptoms.  Patient encouraged  to follow-up with sleep study she reports she has not made follow-up appointments.  During this visit states  maybe medication is  helping.  However she was recently diagnosed with influenza.  Reports decreased energy and decreased motivation.  Discussed following up 2 months for medication adherence/tolerability.  Will continue to monitor symptoms.  Support,  encouragement and reassurance was provided.  Discussed medication contraindications related to potassium and Seroquel  potential effects.  Discussed monitoring closely.  Collaboration of Care: Collaboration of Care: Medication Management AEB Seroquel  60 tablets made available.   Patient/Guardian was advised Release of Information must be obtained prior to any record release in order to collaborate their care with an outside provider. Patient/Guardian was advised if they have not already done so to contact the registration department to sign all necessary forms in order for us  to release information regarding their care.   Consent: Patient/Guardian gives verbal consent for treatment and assignment of benefits for services provided during this visit. Patient/Guardian expressed understanding and agreed to proceed.    Staci LOISE Kerns, NP 11/03/2024, 2:48 PM     [1]  Allergies Allergen Reactions   Elavil  [Amitriptyline ] Other (See Comments)    Coma   Orudis [Ketoprofen] Nausea And Vomiting   Desyrel  [Trazodone ] Nausea And Vomiting   Tylenol  [Acetaminophen ] Nausea And Vomiting   Ambien  [Zolpidem  Tartrate] Nausea And Vomiting    She also reported headache   Aspirin  Nausea Only   Hydroxyzine  Hcl Rash   Motrin  [Ibuprofen ] Nausea And Vomiting   Naprosyn [Naproxen] Nausea And Vomiting   Neurontin  [Gabapentin ] Nausea And Vomiting and Other (See Comments)    upset stomach   Sulfa Antibiotics Nausea And Vomiting   Ultram  [Tramadol ] Nausea And Vomiting and Other (See Comments)    stomach upset   Victoza [Liraglutide] Nausea And Vomiting   Zegerid [Omeprazole -Sodium Bicarbonate ] Nausea And Vomiting

## 2024-11-04 ENCOUNTER — Telehealth: Payer: Self-pay

## 2024-11-04 DIAGNOSIS — E1165 Type 2 diabetes mellitus with hyperglycemia: Secondary | ICD-10-CM

## 2024-11-04 MED ORDER — TIRZEPATIDE 12.5 MG/0.5ML ~~LOC~~ SOAJ: 12.5000 mg | SUBCUTANEOUS | 11 refills | Status: AC

## 2024-11-04 NOTE — Addendum Note (Signed)
 Addended by: Josefita Weissmann on: 11/04/2024 04:02 PM   Modules accepted: Orders

## 2024-11-04 NOTE — Telephone Encounter (Signed)
 Patient wants to increase dosage on Mounjaro . Stated she is supposed to have an increase every month.

## 2024-11-04 NOTE — Telephone Encounter (Signed)
 Sent prescription for Mounjaro 12.5 mg weekly.

## 2024-11-06 ENCOUNTER — Ambulatory Visit: Admitting: Endocrinology

## 2024-11-12 ENCOUNTER — Other Ambulatory Visit: Payer: Self-pay

## 2024-11-14 ENCOUNTER — Telehealth: Payer: Self-pay

## 2024-11-14 ENCOUNTER — Other Ambulatory Visit: Payer: Self-pay

## 2024-11-14 NOTE — Progress Notes (Unsigned)
 "  11/14/2024 Name: Jill Shaw MRN: [id number] DOB: [birth year]  No chief complaint on file.   Jill Shaw is a 49 y.o. year old female who presented for a telephone visit.   They were referred to the pharmacist by their PCP for assistance in managing diabetes. PMH includes HFpEF, HTN, COPD, IBS, gastroparesis, T2DM (hx of DKA), hypoglycemia unawareness, HLD   Subjective: Patient was  admitted at [facility] from 03/27/24 to 03/30/24 in the setting of seizure 2/2 hypoglycemia (BG of 34 mg/dL). This was a recurrent seizure, in the setting of hypoglycemia. She did have a R humerus fracture due to the seizure. She was discharged with reduced dose of insulin  (PTA was on Toujeo  U300 60 units daily and Humalog  U200 60 units TID before meals). Since then, patient's insulin  dose has been slowly titrated back up as patient has reported persistent and symptomatic hyperglycemia. Additionally, she has started Mounjaro  within the past several months. At pharmacy call on 06/27/24 patient was instructed to take Toujeo  U300 80 units once daily, Humalog  40 units TID with meals, Mounjaro  5 mg weekly, Jardiance  10 mg daily, and metformin  XR 1000 mg BID. In the meantime, we have tried to obtain a CGM for her. She was able to see Dr. Mercie (Endo) on 08/06/24. A1C was 7.4%. She was instructed to increase Mounjaro  to 7.5 mg weekly and at that time decrease Toujeo  to 70 units daily and decrease Humalog  to 30 units TID with meals. She was also instructed to increase Jardiance  to 25 mg daily and continue metformin  as prescribed. At pharmacy call on 08/05/24, we reiterated instructions given at Endo and encouraged her to pick up CGM. Staff at [facility] ensured she had the app downloaded on her phone.  Today, patient reports doing ok. States her BG are running higher this past week. She is wondering if she has an infection in her blood. She denies fever, cough, skin abnormalities, pain or burning with urination. She is having some nausea  but this is present at baseline for her. Insists she is barely eating anything, but is not willing to complete food recall from yesterday. States she did have a heavy dinner last night and regular breakfast this AM. She also reports she is having at least 3 cups of ginger ale per day. She reports initial CGM she tried to apply did not work. She is willing to try again.  \ Care Team: Primary Care Provider: Oley Bascom RAMAN, NP ; Next Scheduled Visit: Needs to be scheduled Endocrinologist Dr. Mercie; Next Scheduled Visit: 11/07/23  Medication Access/Adherence  Current Pharmacy:  [id number] - [place], [place] - [address] AT [facility] OF [address] [place] [address] Phone: [phone] Fax: [phone]  [facility] [address], [address] [place] [address] Phone: [phone] Fax: [phone]   Patient reports affordability concerns with their medications: No  Patient reports access/transportation concerns to their pharmacy: No  Patient reports adherence concerns with their medications:  Yes  - hx of self adjusting insulin   Diabetes:  Current medications: Toujeo  U300 Max Solostar (insulin  glargine) 70 units daily (still taking 80 units - in the evening), Humalog  (insulin  lispro) 30 units three times daily before meals (reports she is still taking 30-40 units TID), Jardiance  25 mg daily, metformin  XR 1000 mg BID, Mounjaro  10 mg weekly (Tuesdays)  Medications tried in the past: Victoza (nausea/vomiting) - dx of gastroparesis -  however GI reviewed and patient had a normal gastric emptying study in 2022  Using Accu Chek meter; testing 2-3 times daily Used 1 FL3+ CGM. Had difficulty with accuracy. Willing to pick up refill in case her sensors were part of the refill.  Current glucose readings:  09/29/24: AM 247 mg/dL, PM 675 mg/dL, PM 613 mg/dL 87/83/74: AM 447 mg/dL, PM  613 mg/dL, PM 579 mg/dL 87/82/74: AM 772 mg/dL, PM 613 mg/dL, PM 653 mg/dL 87/81/74: AM 552 mg/dL, PM 623 mg/dL, PM 709 mg/dL 87/80/74: AM 623 mg/dL   Limited CGM data available - last connected on 09/08/24. States that when she double checked with her glucometer her sugars were not actually low (ex: sensor says 60 mg/dL - finger stick read 888 or 120 mg/dL). Had to use 4 sensors before she got this one to work, and it only lasted a few days.    Patient denies hypoglycemic s/sx including dizziness, shakiness, sweating - patient has hypoglycemia unawareness - had CBG of 50 > 52 in ED on 08/09/24. Patient reports hyperglycemic symptoms including polydipsia. Denies blurry vision, frequent urination.   Current meal patterns: She is eating 2-3 times per day. Reports appetite is reduced. Not willing to do food recall from yesterday. States she had a heavy dinner last night. States she is having at least 3 cups of ginger ale per day (regular soda).   Heart Failure with preserved EF (EF 60-65%):  Current medications:  ACEi/ARB/ARNI: losartan  25 mg daily  SGLT2i: Jardiance  10 mg daily Beta blocker: metoprolol  succinate 100 mg daily Mineralocorticoid Receptor Antagonist: spironolactone  25 mg daily Diuretic regimen: torsemide  60 mg daily (switched from furosemide  by nephrology) Potassium 20 mEq daily (last filled 03/12/24 for 30ds)  Current home weights: does not have scale - working with [facility] to refax orders  Objective:  BP Readings from Last 3 Encounters:  11/02/24 (!) 147/94  09/21/24 (!) 136/58  09/08/24 (!) 149/69    Lab Results  Component Value Date   HGBA1C 7.4 (A) 08/06/2024   HGBA1C 9.2 (H) 03/27/2024   HGBA1C 8.9 (A) 02/25/2024       Latest Ref Rng & Units 11/01/2024   11:02 PM 08/09/2024    3:54 AM 08/06/2024    1:56 PM  BMP  Glucose 70 - 99 mg/dL 755  49  768   BUN 6 - 20 mg/dL 11  14  15    Creatinine 0.44 - 1.00 mg/dL 8.68  8.36  8.73   BUN/Creat Ratio 6 - 22 (calc)    12   Sodium 135 - 145 mmol/L 141  138  141   Potassium 3.5 - 5.1 mmol/L 3.8  3.5  4.2   Chloride 98 - 111 mmol/L 101  106  109   CO2 22 - 32 mmol/L 24  22  22    Calcium  8.9 - 10.3 mg/dL 89.3  9.3  89.6     Lab Results  Component Value Date   CHOL 153 12/09/2021   HDL 54 12/09/2021   LDLCALC 74 12/09/2021   TRIG 144 12/09/2021   CHOLHDL 2.8 12/09/2021    Medications Reviewed Today   Medications were not reviewed in this encounter       Assessment/Plan:    Diabetes: - Currently controlled with most recent A1C of 7.4% below goal <8% given recurrent hypoglycemia with seizures. Could consider lower A1C goal if we are able to achieve it without hypoglycemia, however given recent ED visits with incidental hypoglycemia it appears patient continues to  have hypoglycemia unawareness. There was also hypoglycemia on her 3 days of CGM back in Nov, but patient states this was not accurate and denies any BG < 70 mg/dL on her glucometer. For the past week, reports BG ranging 200s-400s in the setting of increased soda intake. Advised her to STOP intake of all sugary beverages. Advised her to reduce insulin  doses to what was recommended at previous appointments. Encouraged reapplication of CGM. Worked with [facility] pharmacy to request refill.  - Last UACR 02/25/24: 146 mg/g. Patient is treated with SGLT2i - need to continue to monitor closely for development of GU infections given risk factors of female sex, obesity, and poor glycemic control.  - Reviewed long term cardiovascular and renal outcomes of uncontrolled blood sugar - Reviewed goal A1c, goal fasting, and goal 2 hour post prandial glucose - Reviewed hypoglycemia management plan and the rule of 15 - Reviewed dietary modifications including  utilizing the healthy plate method, limiting portion size of carbohydrate foods, increasing intake of protein and non-starchy vegetables. Counseled patient to stay hydrated with water throughout the day. -  Recommend to continue Jardiance  25 mg daily - Recommend to continue metformin  XR to 1000 mg BID. Continue to monitor eGFR to ensure it stays > 45 mL/min - Recommend to decrease Toujeo  U300 to 70 units once daily in the evening as instructed by Endo - Recommend to decrease Humalog  to 30 units TID with meals as instructed by Endo - Recommend to continue Mounjaro  to 10 mg weekly  - Recommend to drink water only and avoid sugar containing beverages - Recommend to eat a small meal that contains protein three time daily, regardless of blood sugar.  - Encouraged patient to pick up FL3+ from [facility] pharmacy ($0 copay). - Next A1C due 11/07/23   Patient verbalized understanding of treatment plan.   Follow Up Plan:  PCP needs to be scheduled Pharmacist telephone 10/24/24 Endo 11/06/24 - confirmed she could make this appt   Lorain Baseman, PharmD [facility] [phone]   "

## 2024-11-14 NOTE — Progress Notes (Signed)
 Attempted to contact patient for scheduled appointment for medication management. Left HIPAA compliant message for patient to return my call at their convenience.   Lorain Baseman, PharmD [facility] [phone]

## 2024-11-21 ENCOUNTER — Other Ambulatory Visit: Payer: Self-pay

## 2024-12-18 ENCOUNTER — Ambulatory Visit: Admitting: Endocrinology

## 2024-12-26 ENCOUNTER — Other Ambulatory Visit: Payer: Self-pay

## 2025-02-12 ENCOUNTER — Other Ambulatory Visit (HOSPITAL_COMMUNITY)

## 2025-02-12 ENCOUNTER — Ambulatory Visit (HOSPITAL_COMMUNITY): Admit: 2025-02-12 | Payer: Self-pay

## 2025-02-12 SURGERY — MRI WITH ANESTHESIA
Anesthesia: General

## 2025-02-20 ENCOUNTER — Ambulatory Visit (HOSPITAL_COMMUNITY)

## 2025-02-20 ENCOUNTER — Encounter: Admitting: Vascular Surgery
